# Patient Record
Sex: Female | Born: 1940 | Race: Black or African American | Hispanic: No | Marital: Married | Smoking: Never smoker
Health system: Southern US, Community
[De-identification: ages and names within clinical notes are randomized; demographics above are authoritative.]

## PROBLEM LIST (undated history)

## (undated) DIAGNOSIS — E1169 Type 2 diabetes mellitus with other specified complication: Secondary | ICD-10-CM

## (undated) DIAGNOSIS — G4730 Sleep apnea, unspecified: Secondary | ICD-10-CM

## (undated) DIAGNOSIS — N183 Chronic kidney disease, stage 3 unspecified: Secondary | ICD-10-CM

## (undated) DIAGNOSIS — K760 Fatty (change of) liver, not elsewhere classified: Secondary | ICD-10-CM

## (undated) DIAGNOSIS — F419 Anxiety disorder, unspecified: Secondary | ICD-10-CM

## (undated) DIAGNOSIS — Z87442 Personal history of urinary calculi: Secondary | ICD-10-CM

## (undated) DIAGNOSIS — F418 Other specified anxiety disorders: Secondary | ICD-10-CM

## (undated) DIAGNOSIS — I639 Cerebral infarction, unspecified: Secondary | ICD-10-CM

## (undated) DIAGNOSIS — I119 Hypertensive heart disease without heart failure: Secondary | ICD-10-CM

## (undated) DIAGNOSIS — J449 Chronic obstructive pulmonary disease, unspecified: Secondary | ICD-10-CM

## (undated) DIAGNOSIS — E1129 Type 2 diabetes mellitus with other diabetic kidney complication: Secondary | ICD-10-CM

## (undated) DIAGNOSIS — D860 Sarcoidosis of lung: Secondary | ICD-10-CM

## (undated) DIAGNOSIS — J9600 Acute respiratory failure, unspecified whether with hypoxia or hypercapnia: Secondary | ICD-10-CM

## (undated) DIAGNOSIS — M810 Age-related osteoporosis without current pathological fracture: Secondary | ICD-10-CM

## (undated) DIAGNOSIS — T50905A Adverse effect of unspecified drugs, medicaments and biological substances, initial encounter: Secondary | ICD-10-CM

## (undated) DIAGNOSIS — E785 Hyperlipidemia, unspecified: Secondary | ICD-10-CM

## (undated) DIAGNOSIS — J90 Pleural effusion, not elsewhere classified: Secondary | ICD-10-CM

## (undated) DIAGNOSIS — I779 Disorder of arteries and arterioles, unspecified: Secondary | ICD-10-CM

## (undated) DIAGNOSIS — I255 Ischemic cardiomyopathy: Secondary | ICD-10-CM

## (undated) DIAGNOSIS — I5032 Chronic diastolic (congestive) heart failure: Secondary | ICD-10-CM

## (undated) DIAGNOSIS — I739 Peripheral vascular disease, unspecified: Secondary | ICD-10-CM

## (undated) DIAGNOSIS — I70269 Atherosclerosis of native arteries of extremities with gangrene, unspecified extremity: Secondary | ICD-10-CM

## (undated) DIAGNOSIS — E1351 Other specified diabetes mellitus with diabetic peripheral angiopathy without gangrene: Secondary | ICD-10-CM

## (undated) DIAGNOSIS — M1990 Unspecified osteoarthritis, unspecified site: Secondary | ICD-10-CM

## (undated) DIAGNOSIS — K208 Other esophagitis: Secondary | ICD-10-CM

## (undated) DIAGNOSIS — I2510 Atherosclerotic heart disease of native coronary artery without angina pectoris: Secondary | ICD-10-CM

## (undated) DIAGNOSIS — R197 Diarrhea, unspecified: Secondary | ICD-10-CM

## (undated) DIAGNOSIS — E1165 Type 2 diabetes mellitus with hyperglycemia: Secondary | ICD-10-CM

## (undated) HISTORY — PX: CARPAL TUNNEL RELEASE: SHX101

## (undated) HISTORY — DX: Chronic kidney disease, stage 3 (moderate): N18.3

## (undated) HISTORY — DX: Atherosclerosis of native arteries of extremities with gangrene, unspecified extremity: I70.269

## (undated) HISTORY — PX: HERNIA REPAIR: SHX51

## (undated) HISTORY — DX: Cerebral infarction, unspecified: I63.9

## (undated) HISTORY — DX: Atherosclerotic heart disease of native coronary artery without angina pectoris: I25.10

## (undated) HISTORY — PX: EYE SURGERY: SHX253

## (undated) HISTORY — DX: Type 2 diabetes mellitus with other diabetic kidney complication: E11.29

## (undated) HISTORY — DX: Type 2 diabetes mellitus with other specified complication: E11.69

## (undated) HISTORY — DX: Unspecified osteoarthritis, unspecified site: M19.90

## (undated) HISTORY — DX: Ischemic cardiomyopathy: I25.5

## (undated) HISTORY — DX: Fatty (change of) liver, not elsewhere classified: K76.0

## (undated) HISTORY — DX: Diarrhea, unspecified: R19.7

## (undated) HISTORY — DX: Other specified anxiety disorders: F41.8

## (undated) HISTORY — DX: Type 2 diabetes mellitus with hyperglycemia: E11.65

## (undated) HISTORY — DX: Other esophagitis: K20.8

## (undated) HISTORY — DX: Acute respiratory failure, unspecified whether with hypoxia or hypercapnia: J96.00

## (undated) HISTORY — DX: Other specified diabetes mellitus with diabetic peripheral angiopathy without gangrene: E13.51

## (undated) HISTORY — DX: Age-related osteoporosis without current pathological fracture: M81.0

## (undated) HISTORY — PX: CORONARY ANGIOPLASTY WITH STENT PLACEMENT: SHX49

## (undated) HISTORY — DX: Sarcoidosis of lung: D86.0

## (undated) HISTORY — DX: Chronic obstructive pulmonary disease, unspecified: J44.9

## (undated) HISTORY — DX: Adverse effect of unspecified drugs, medicaments and biological substances, initial encounter: T50.905A

## (undated) HISTORY — PX: OOPHORECTOMY: SHX86

## (undated) HISTORY — PX: CHOLECYSTECTOMY: SHX55

## (undated) HISTORY — DX: Hypertensive heart disease without heart failure: I11.9

## (undated) HISTORY — PX: BREAST CYST ASPIRATION: SHX578

## (undated) HISTORY — DX: Hyperlipidemia, unspecified: E78.5

## (undated) HISTORY — DX: Pleural effusion, not elsewhere classified: J90

## (undated) HISTORY — DX: Chronic kidney disease, stage 3 unspecified: N18.30

## (undated) HISTORY — PX: ABDOMINAL HYSTERECTOMY: SHX81

---

## 2006-03-19 ENCOUNTER — Ambulatory Visit: Payer: Self-pay | Admitting: Internal Medicine

## 2006-12-12 LAB — HM PAP SMEAR

## 2007-01-05 ENCOUNTER — Ambulatory Visit: Payer: Self-pay | Admitting: Gastroenterology

## 2007-03-03 ENCOUNTER — Ambulatory Visit: Payer: Self-pay | Admitting: Internal Medicine

## 2007-05-21 ENCOUNTER — Emergency Department: Payer: Self-pay | Admitting: Emergency Medicine

## 2007-05-21 ENCOUNTER — Other Ambulatory Visit: Payer: Self-pay

## 2007-06-08 ENCOUNTER — Emergency Department: Payer: Self-pay | Admitting: Unknown Physician Specialty

## 2007-06-08 ENCOUNTER — Other Ambulatory Visit: Payer: Self-pay

## 2007-07-08 ENCOUNTER — Emergency Department: Payer: Self-pay | Admitting: Internal Medicine

## 2007-07-16 ENCOUNTER — Emergency Department: Payer: Self-pay | Admitting: Emergency Medicine

## 2007-07-27 ENCOUNTER — Ambulatory Visit: Payer: Self-pay

## 2007-10-20 ENCOUNTER — Ambulatory Visit: Payer: Self-pay | Admitting: Surgery

## 2007-11-30 ENCOUNTER — Inpatient Hospital Stay: Payer: Self-pay | Admitting: Surgery

## 2008-03-15 ENCOUNTER — Ambulatory Visit: Payer: Self-pay | Admitting: Internal Medicine

## 2008-07-11 IMAGING — CR DG CHEST 2V
1 series · 2 of 2 positions shown · non-contrast
Comparison: none

REASON FOR EXAM: Shortness of breath
COMMENTS:

PROCEDURE:     DXR - DXR CHEST PA (OR AP) AND LATERAL  - March 19, 2006 [DATE]
RESULT:     The lung fields are clear. The heart, mediastinal and osseous
structures show no acute changes. There is an old fracture of the fifth,
LEFT posterior rib. No acute bony abnormalities are seen.

[Series 1: view not recorded · 0.17mm/px · 2 of 2 slices shown]
[im 1/2]
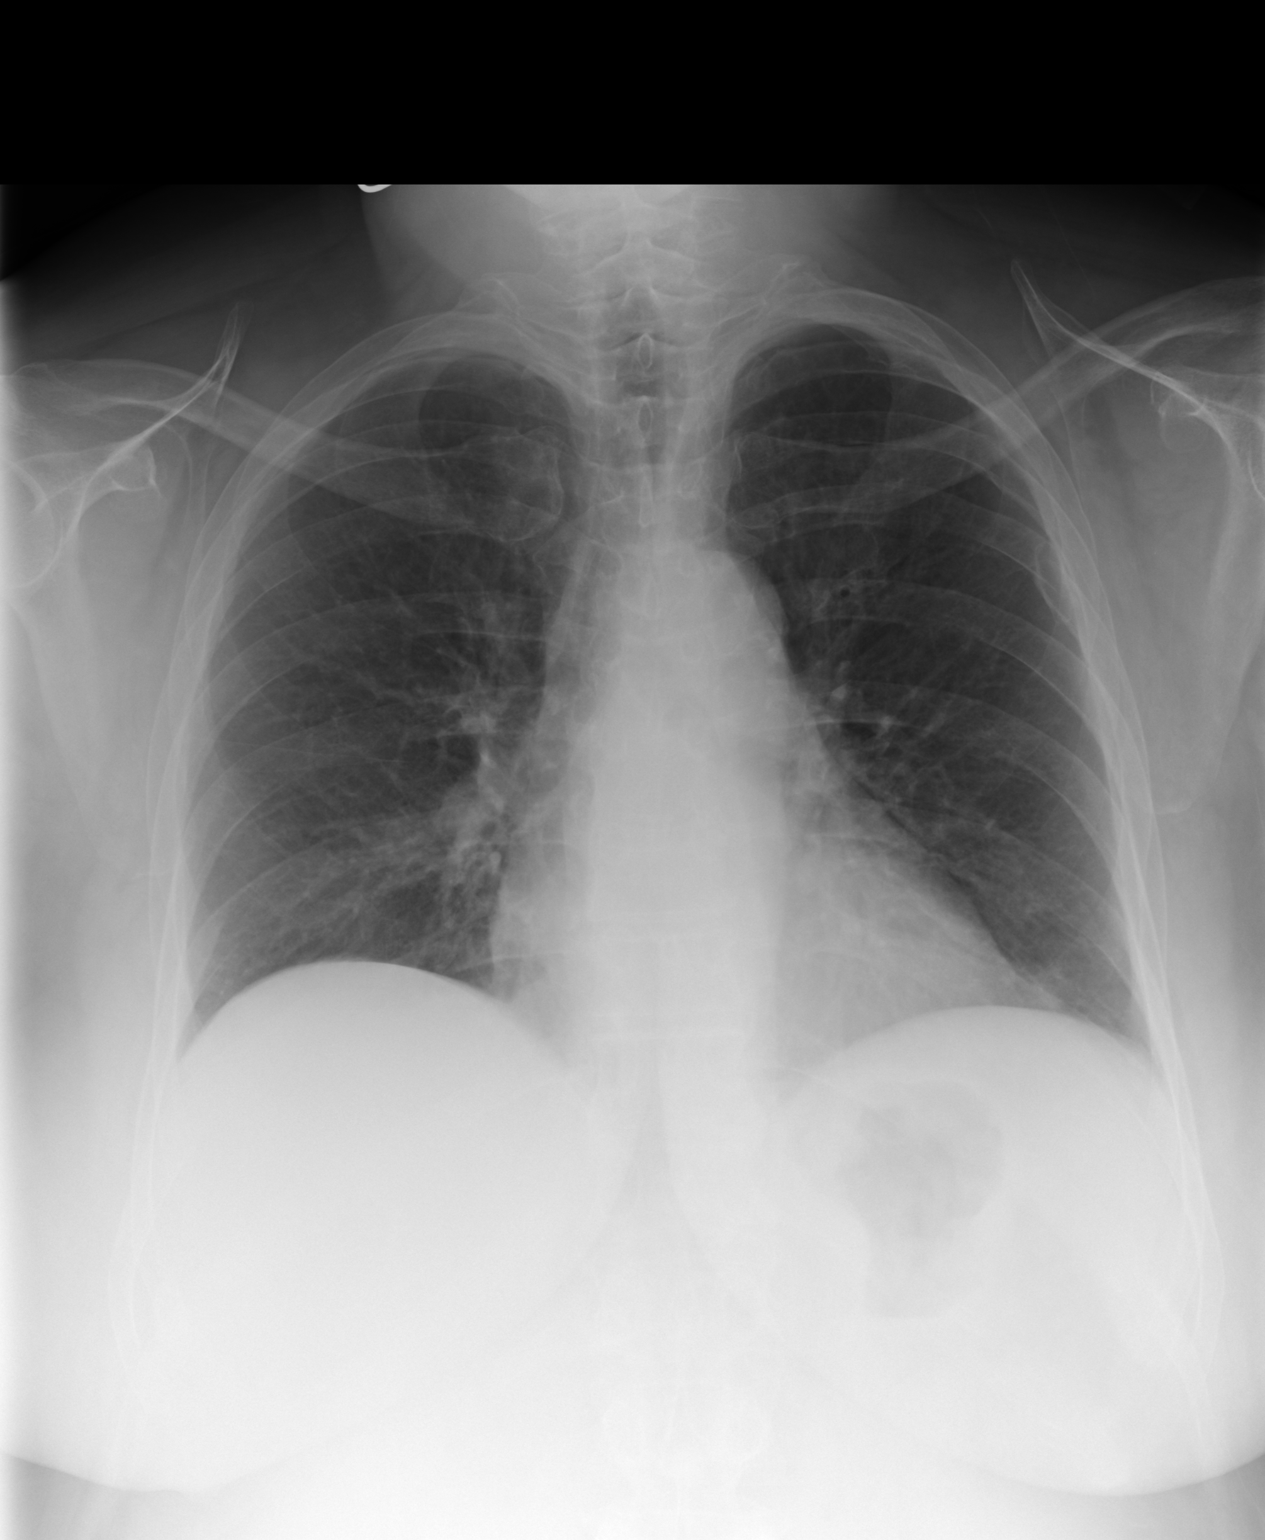
[im 2/2]
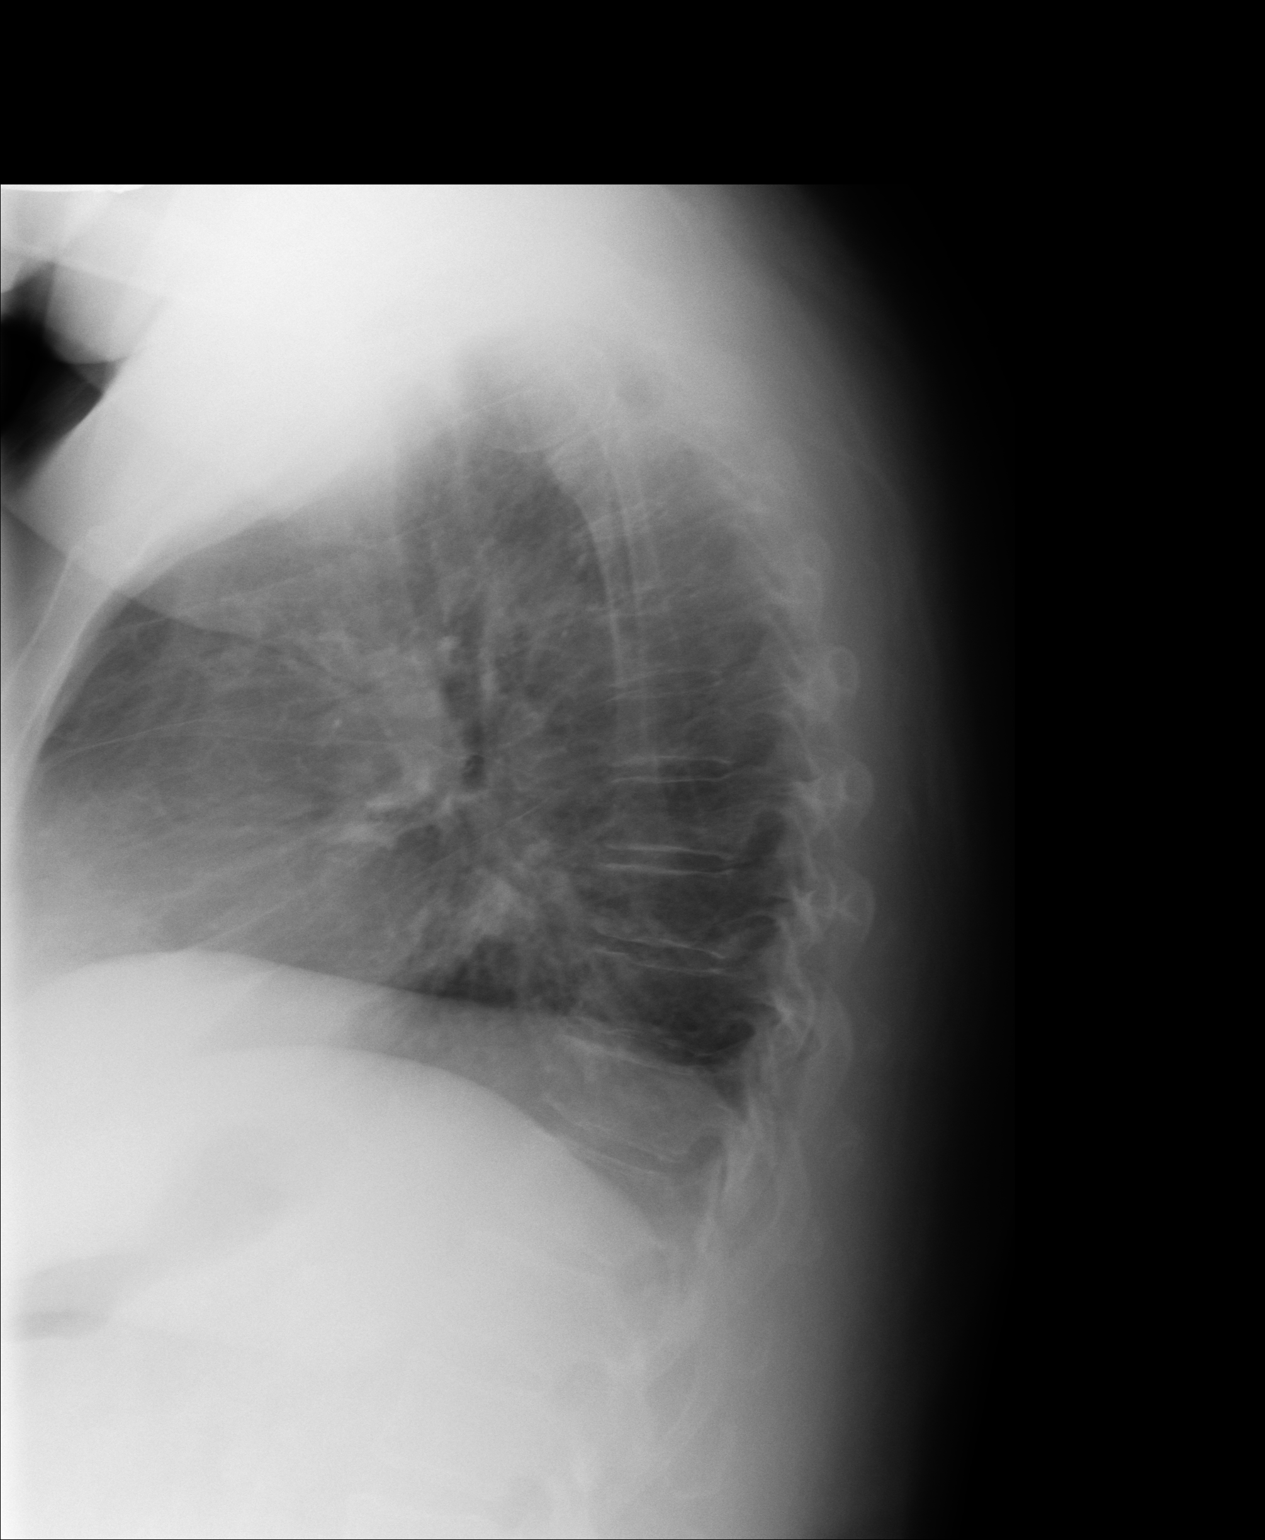

[2 of 2 positions shown; findings below may reference images not displayed]

IMPRESSION: No acute changes are identified.

## 2008-09-10 ENCOUNTER — Emergency Department: Payer: Self-pay | Admitting: Emergency Medicine

## 2008-09-11 ENCOUNTER — Emergency Department: Payer: Self-pay | Admitting: Emergency Medicine

## 2009-02-13 ENCOUNTER — Ambulatory Visit: Payer: Self-pay | Admitting: Cardiovascular Disease

## 2009-07-19 ENCOUNTER — Ambulatory Visit: Payer: Self-pay | Admitting: Internal Medicine

## 2009-09-12 IMAGING — US US EXTREM LOW VENOUS*L*
1 series · 18 of 24 positions shown · non-contrast
Comparison: none

REASON FOR EXAM: LLE  pain
COMMENTS:

PROCEDURE:     US  - US DOPPLER LOW EXTR LEFT  - May 21, 2007  [DATE]
RESULT:     LEFT lower extremity color flow Duplex Doppler reveals no
evidence of deep venous thrombosis. Compression studies, Doppler studies and
color flow analysis are normal.

[Series 1: us extrem low venous*left* · 18 of 29 slices shown]
[im 1/29]
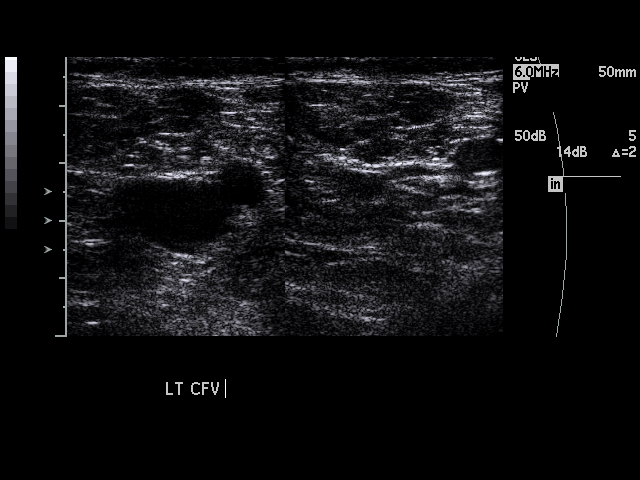
[im 3/29]
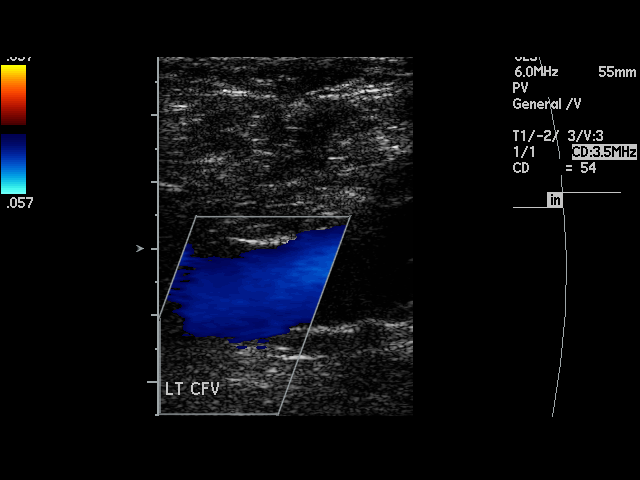
[im 4/29]
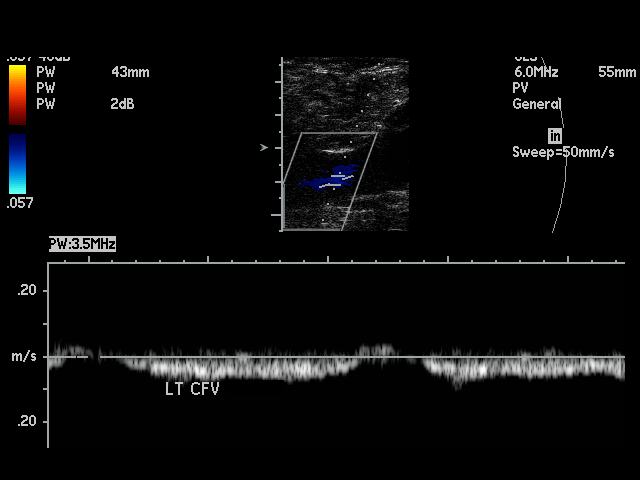
[im 5/29]
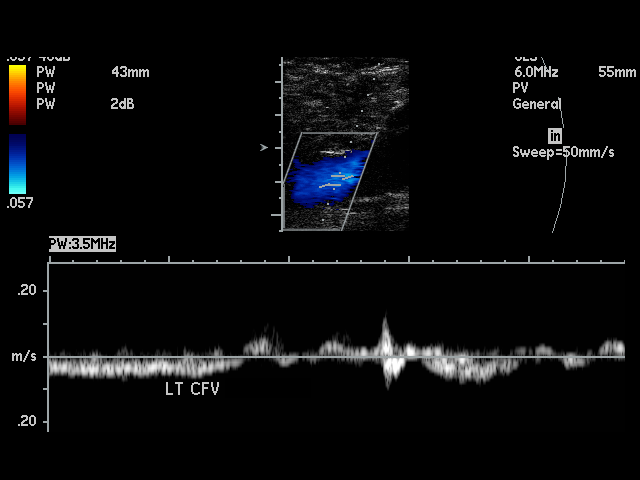
[im 8/29]
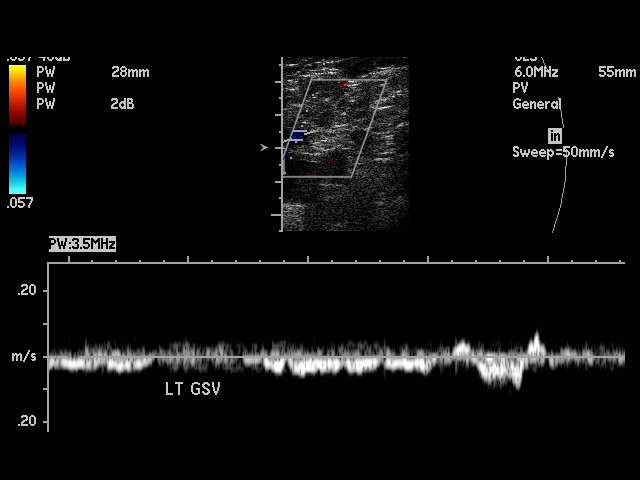
[im 9/29]
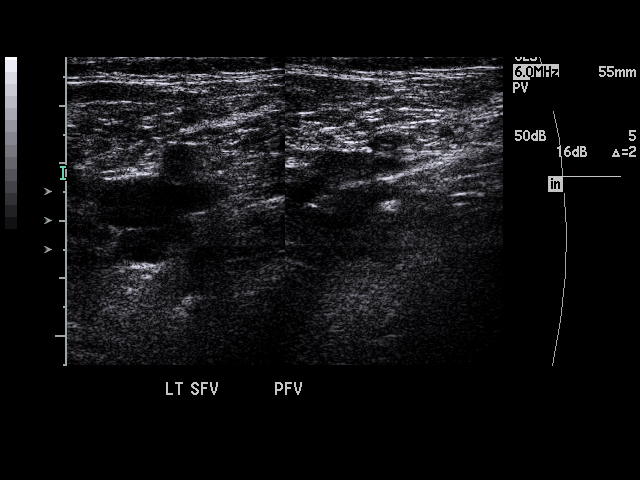
[im 10/29]
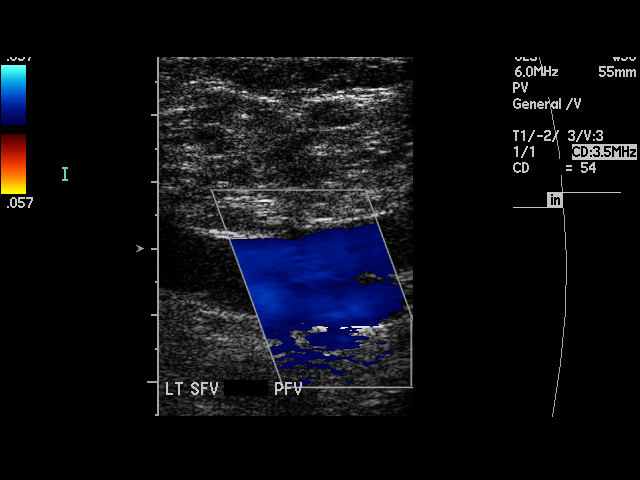
[im 13/29]
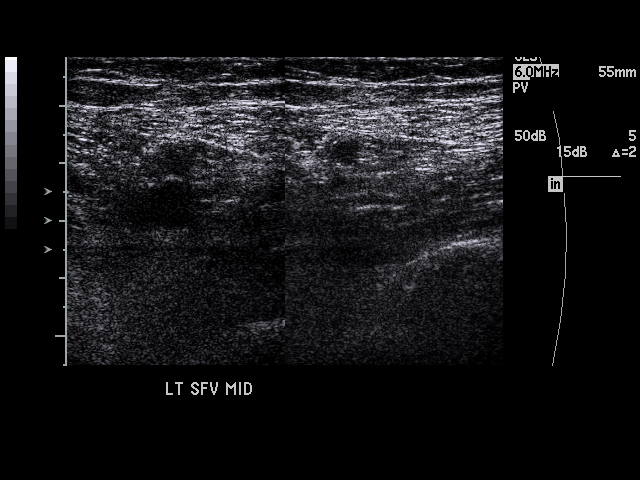
[im 14/29]
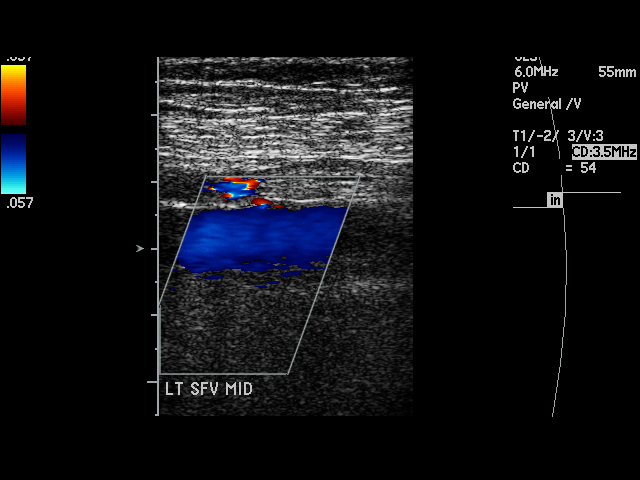
[im 15/29]
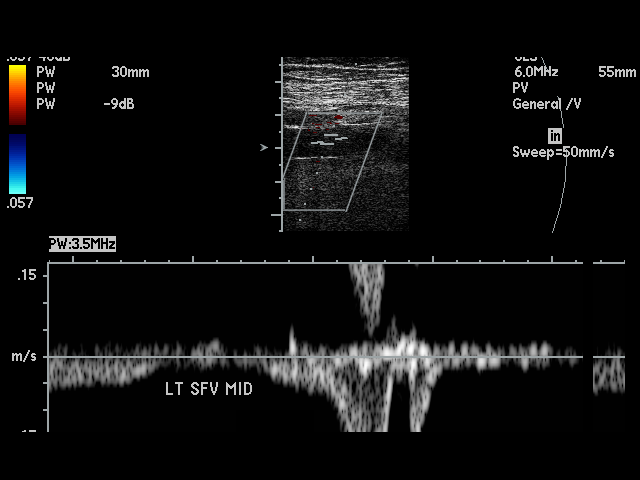
[im 18/29]
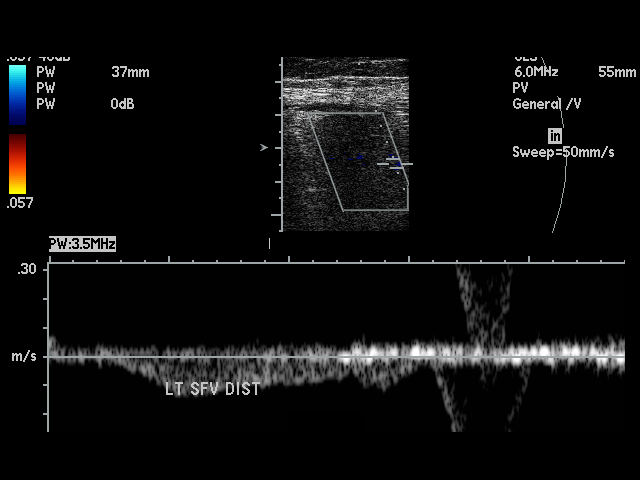
[im 19/29]
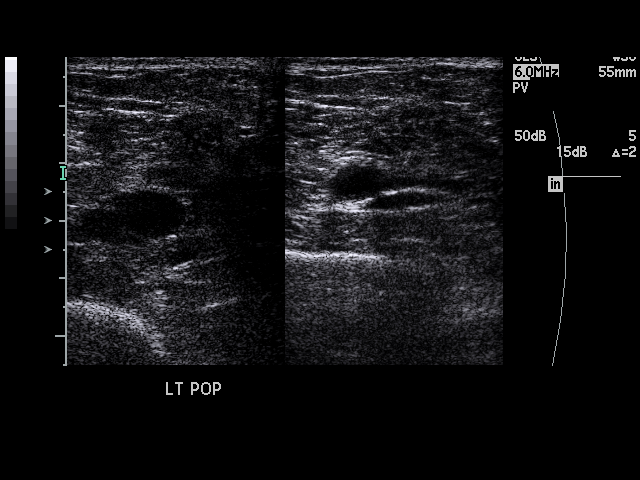
[im 20/29]
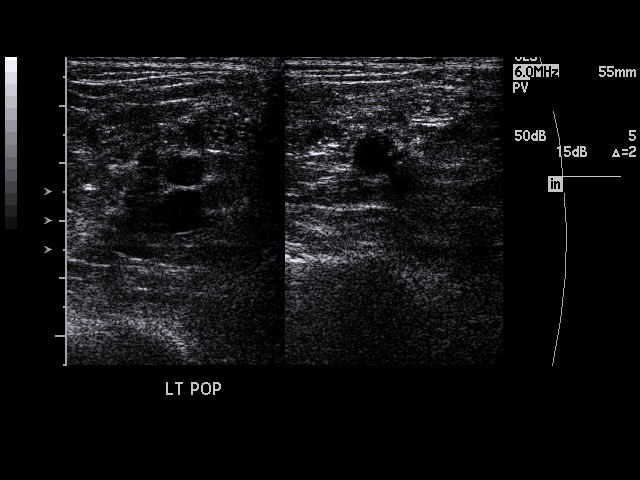
[im 22/29]
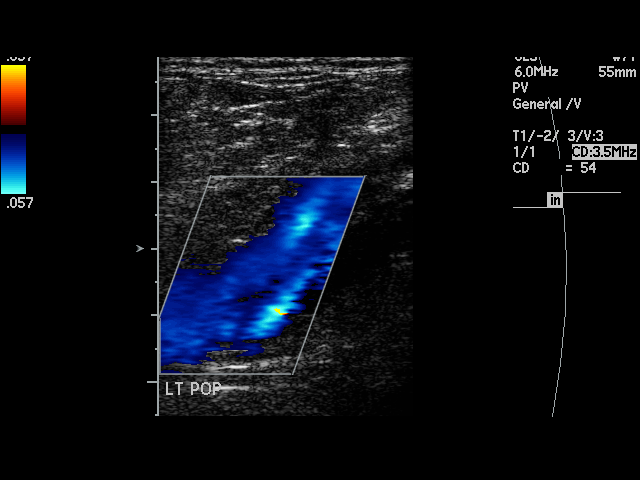
[im 24/29]
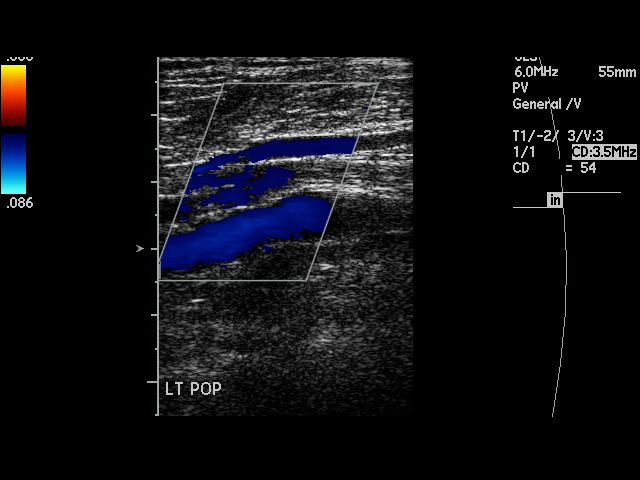
[im 25/29]
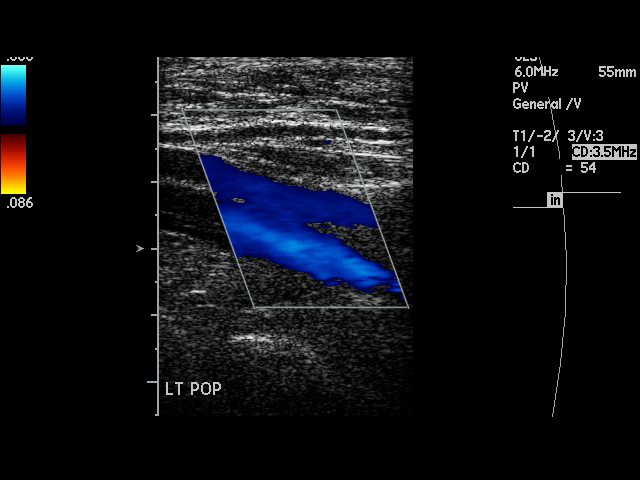
[im 27/29]
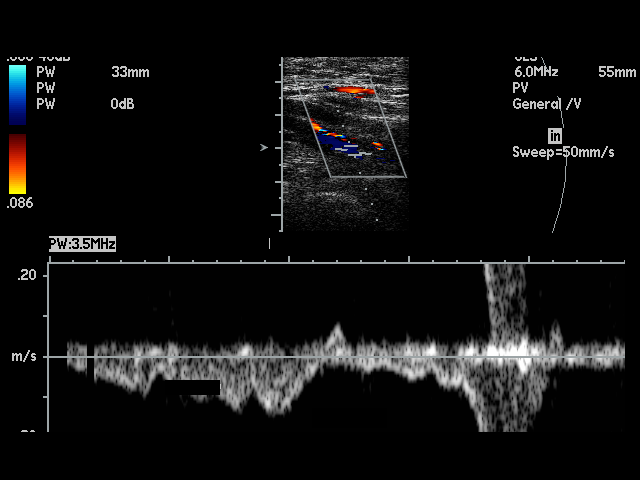
[im 29/29]
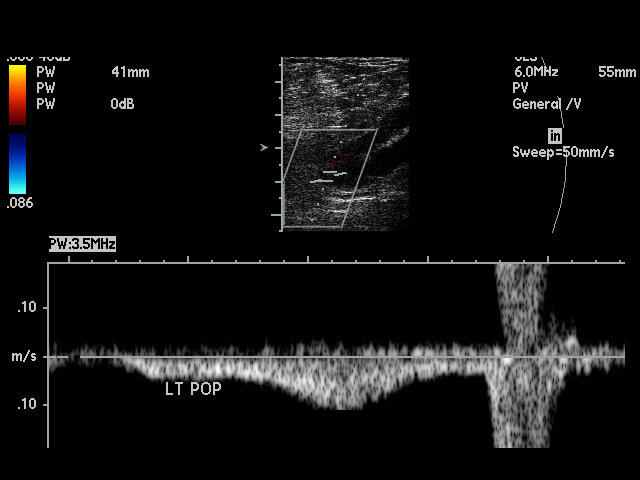

[18 of 24 positions shown; findings below may reference images not displayed]

IMPRESSION: Negative exam.

## 2009-09-12 IMAGING — CR DG CHEST 1V PORT
1 series · 1 of 1 positions shown · non-contrast
Comparison: none

REASON FOR EXAM: chest pain
COMMENTS:

PROCEDURE:     DXR - DXR PORTABLE CHEST SINGLE VIEW  - May 21, 2007  [DATE]
RESULT:     Basilar atelectasis is present.  The patient has taken a poor
inspiratory effort.  Follow up chest x-rays can be obtained to exclude
developing infiltrate. The heart size is normal.

[view not recorded]
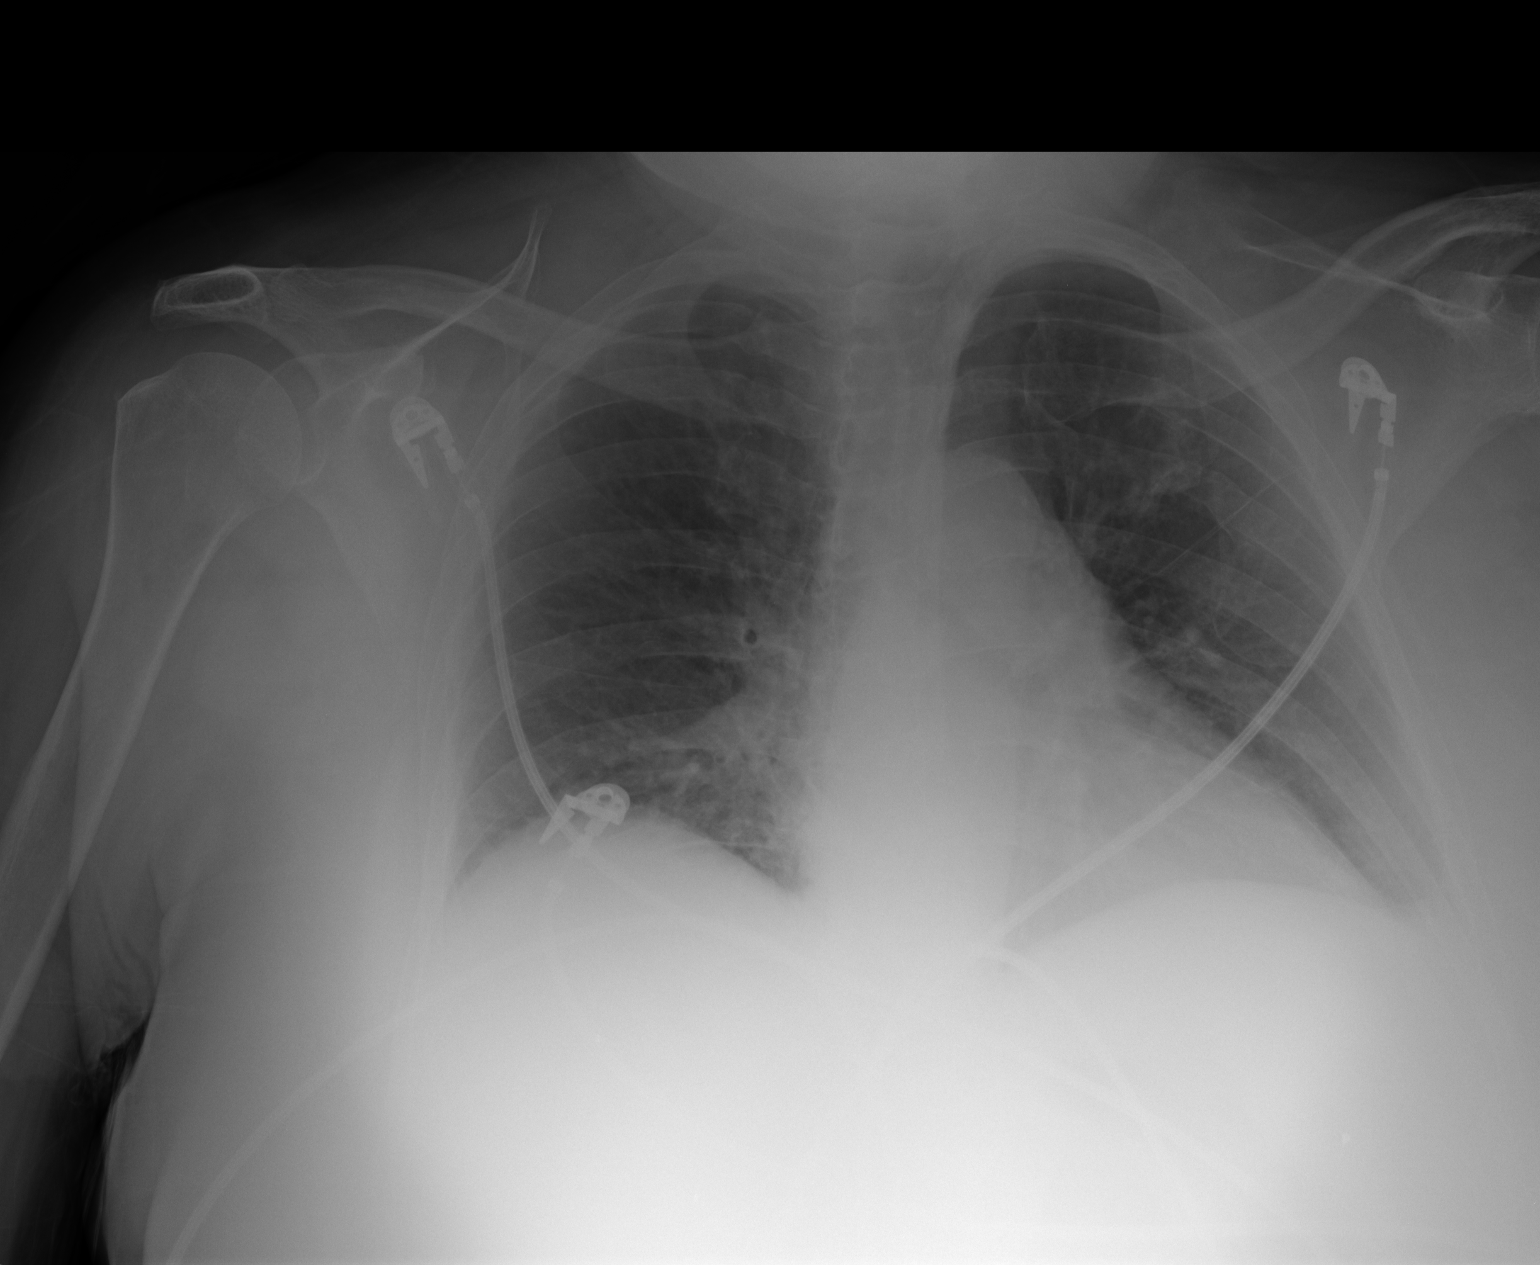

[1 of 1 positions shown; findings below may reference images not displayed]

IMPRESSION: Poor inspiratory effort with mild basilar atelectasis.

## 2009-09-30 IMAGING — CR CERVICAL SPINE - 2-3 VIEW
1 series · 6 of 6 positions shown · non-contrast
Comparison: none

REASON FOR EXAM: Pain
COMMENTS:

PROCEDURE:     DXR - DXR C- SPINE AP AND LATERAL  - June 08, 2007 [DATE]
RESULT:     Multilevel degenerative changes are appreciated within the
cervical spine without evidence of fracture, dislocation or malalignment.
There does not appear to be evidence of prevertebral soft tissue swelling.

[Series 1: view not recorded · 0.17mm/px · 6 of 6 slices shown]
[im 1/6]
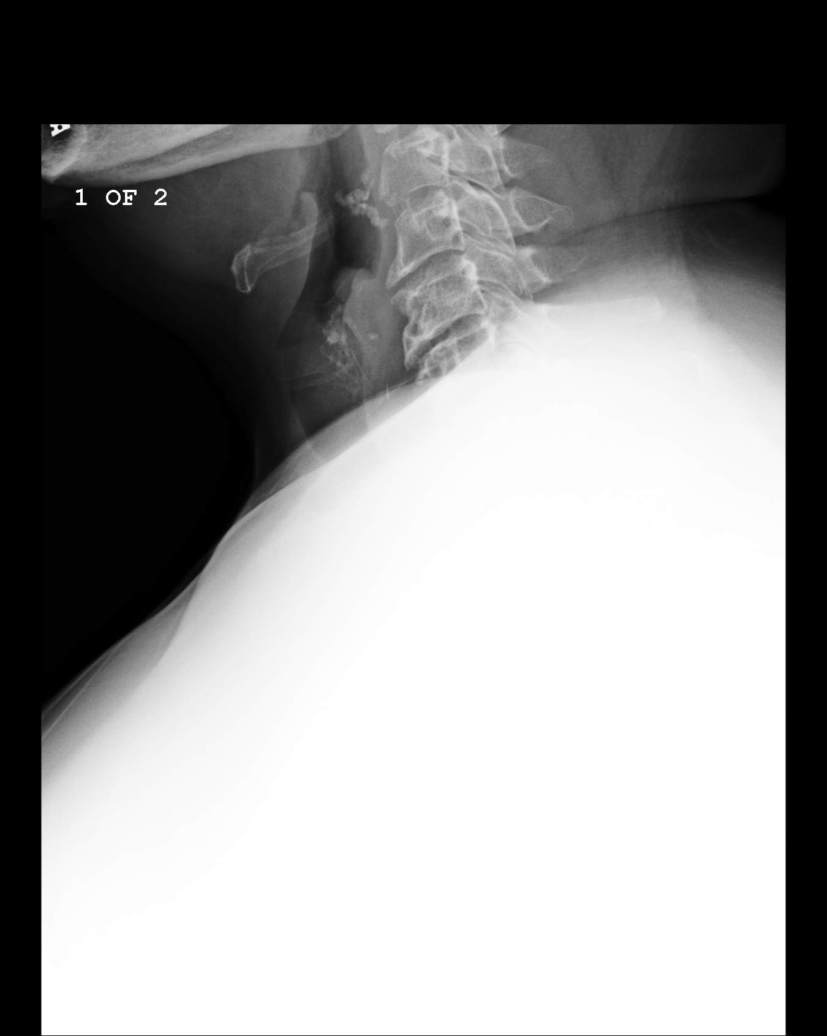
[im 2/6]
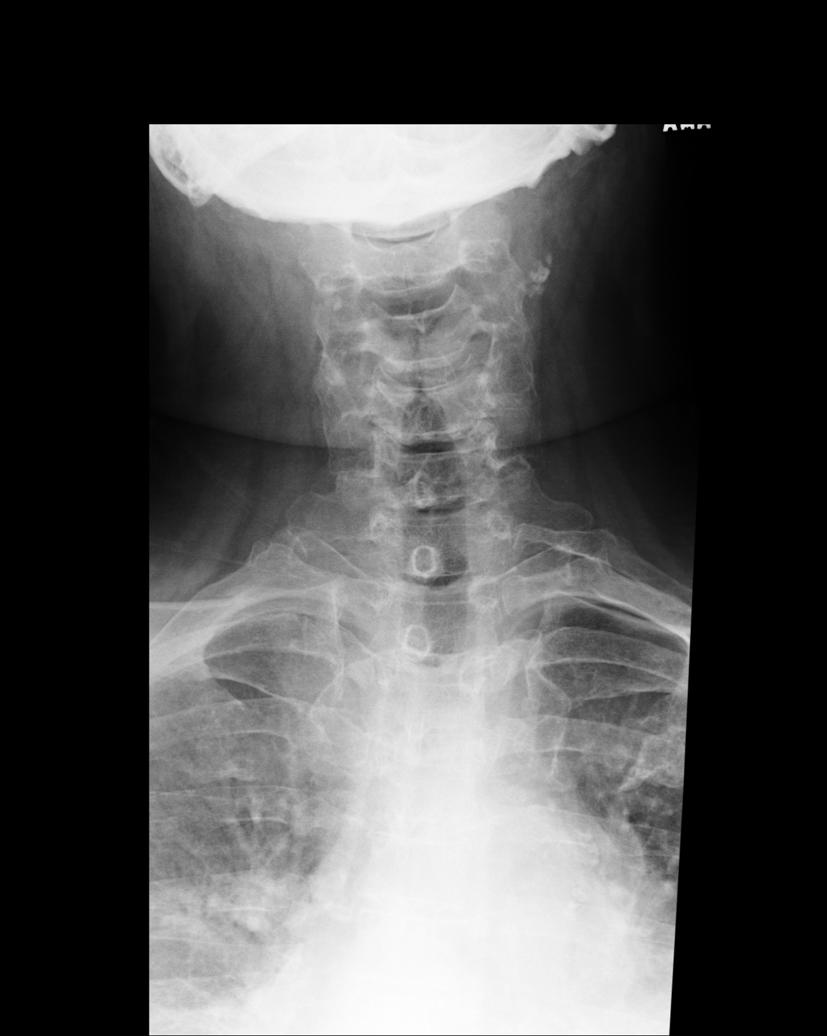
[im 3/6]
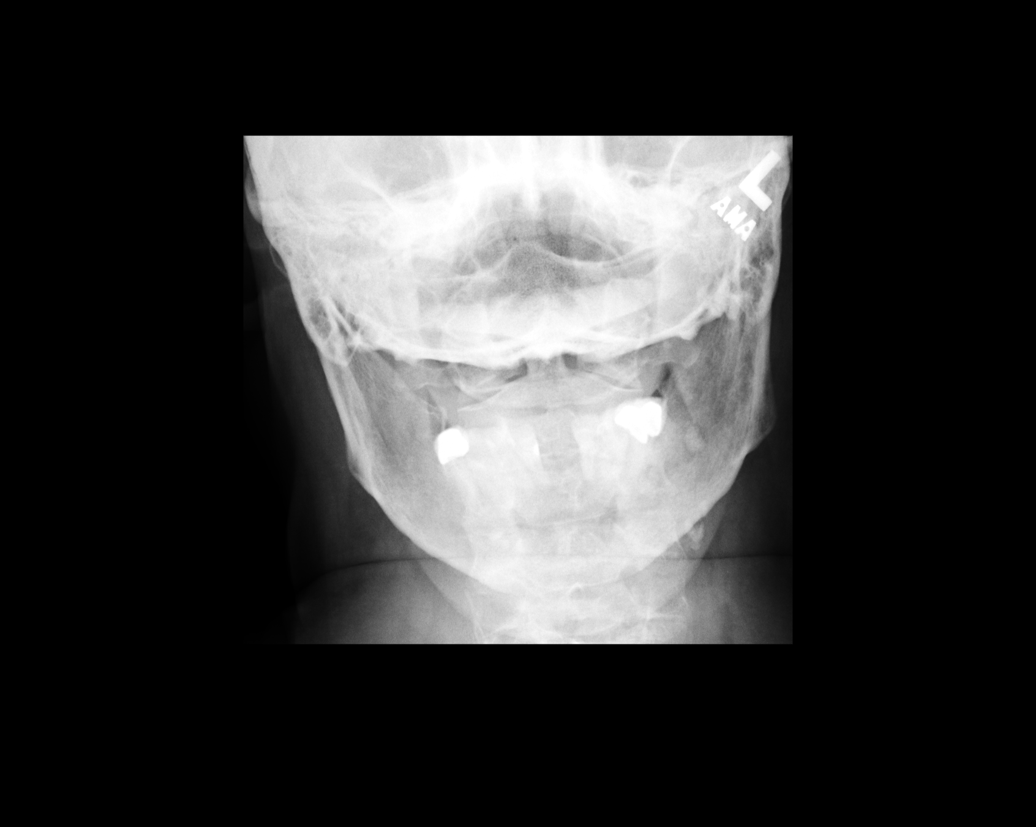
[im 4/6]
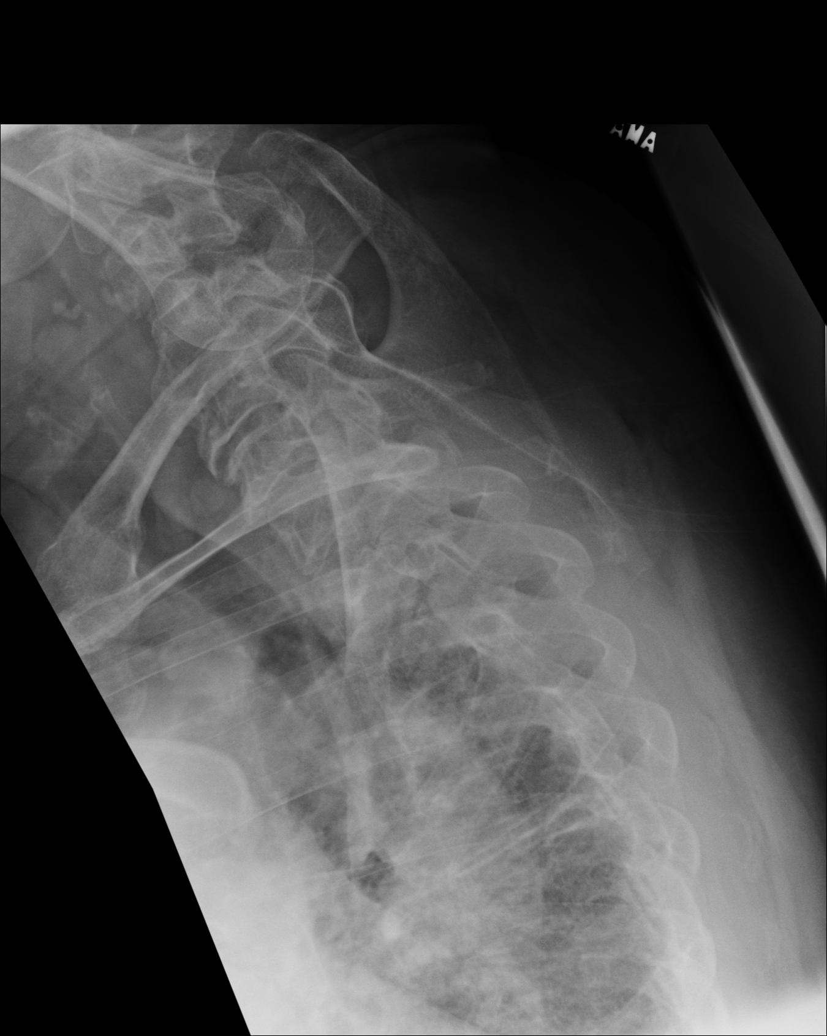
[im 5/6]
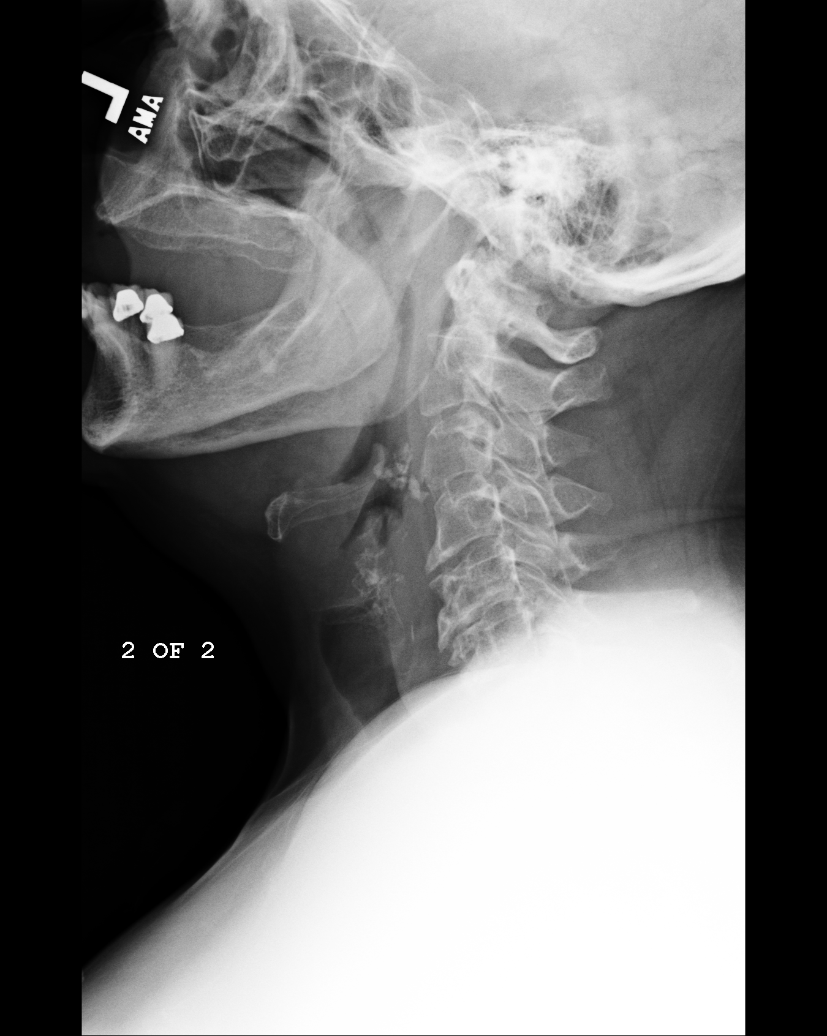
[im 6/6]
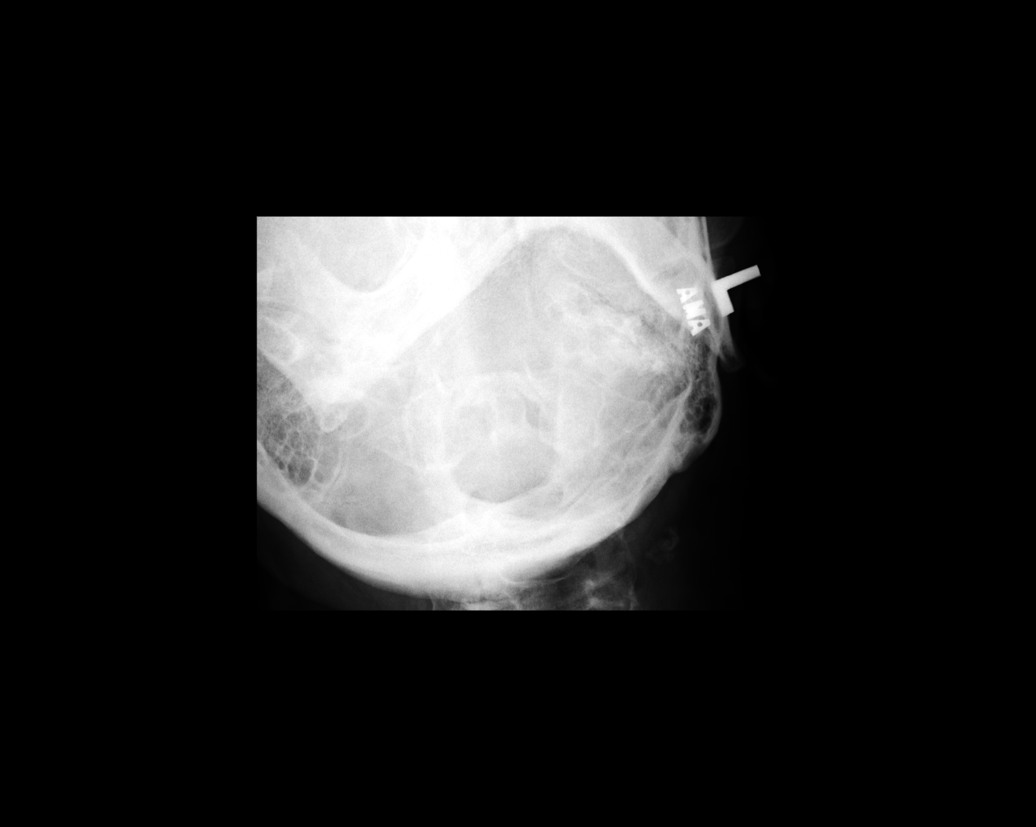

[6 of 6 positions shown; findings below may reference images not displayed]

IMPRESSION: Degenerative changes without evidence of acute osseous
abnormalities.

## 2009-09-30 IMAGING — CT CT HEAD WITHOUT CONTRAST
2 series · 16 of 30 positions shown, 20 images · non-contrast
Comparison: none

REASON FOR EXAM: Diffuse pain and weakness
COMMENTS:

[Series 2: without · axial · non-contrast · 0.39mm/px · z∈[+843,+963]mm · 13 of 28 slices shown, 17 images]
[im 2/28  brain]
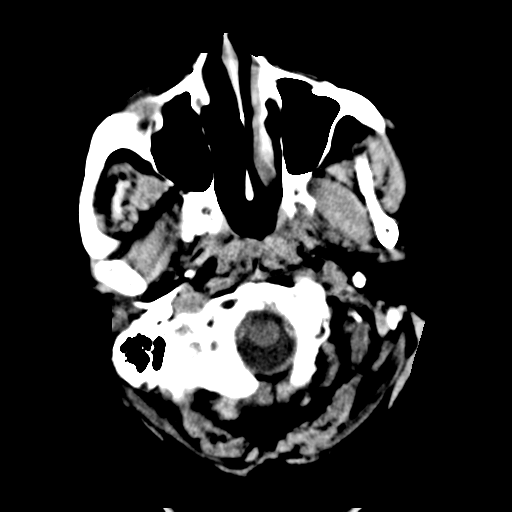
[im 2/28  bone]
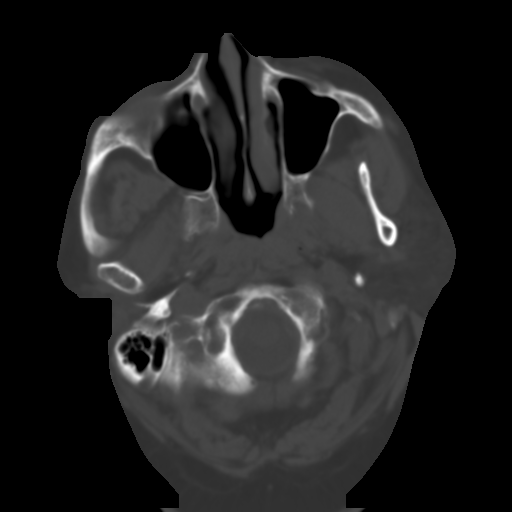
[im 4/28  brain]
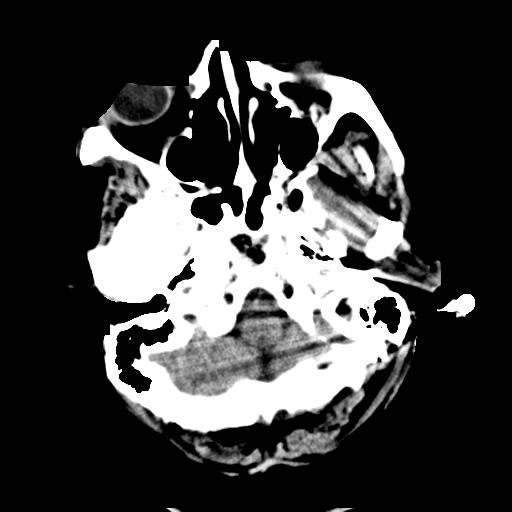
[im 6/28  brain]
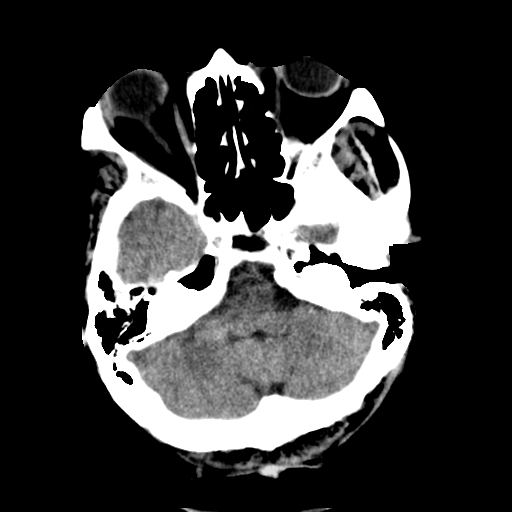
[im 8/28  brain]
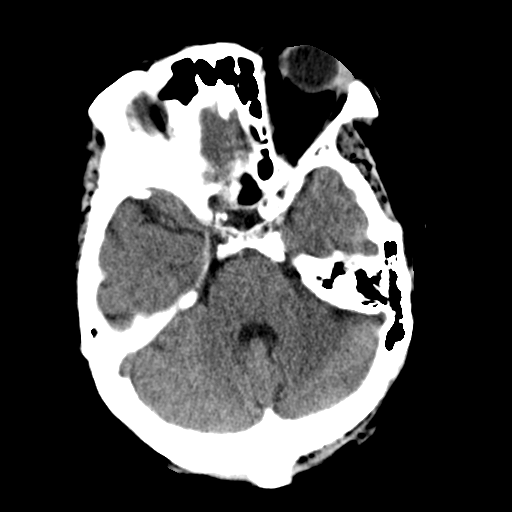
[im 10/28  brain]
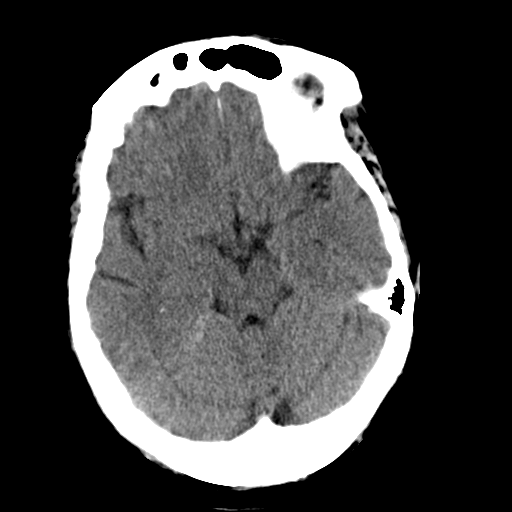
[im 10/28  bone]
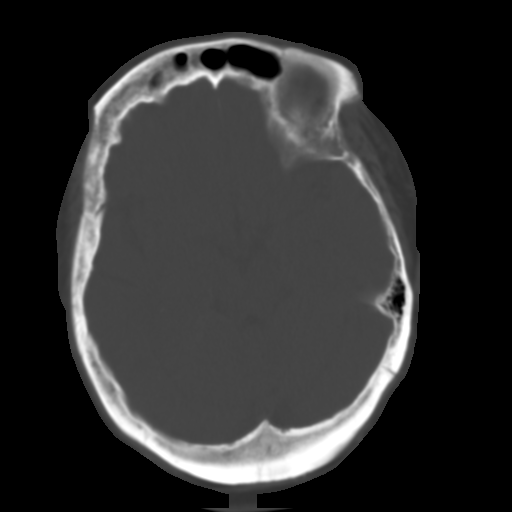
[im 12/28  brain]
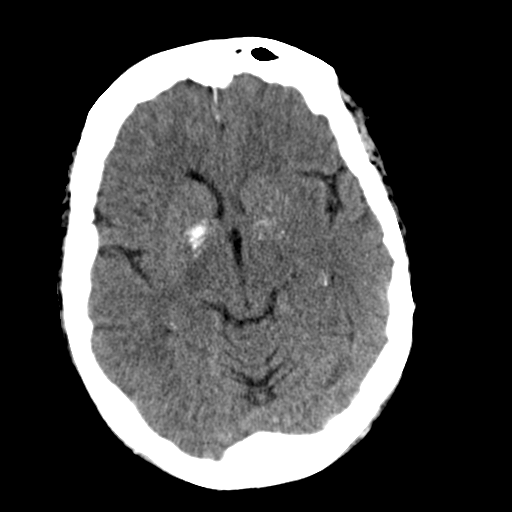
[im 14/28  brain]
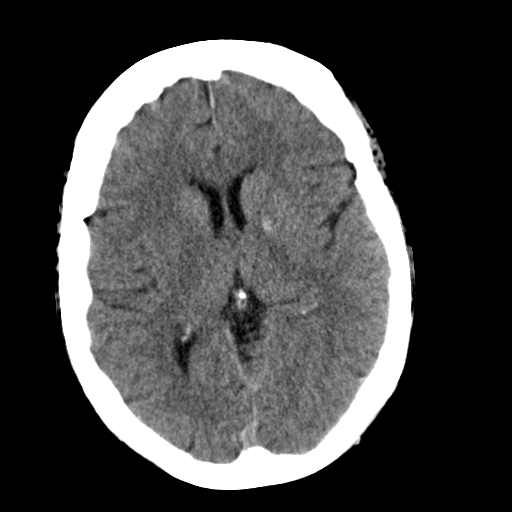
[im 16/28  brain]
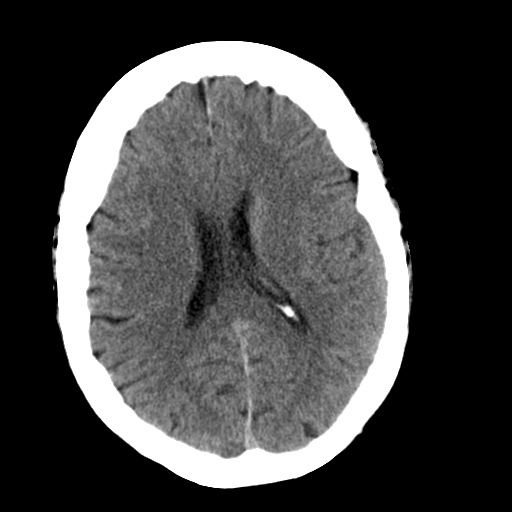
[im 18/28  brain]
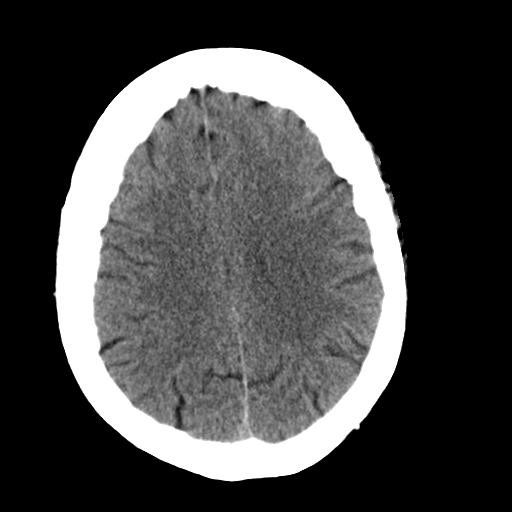
[im 18/28  bone]
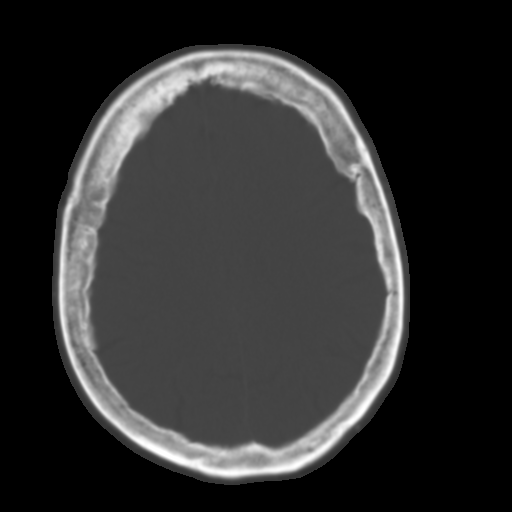
[im 20/28  brain]
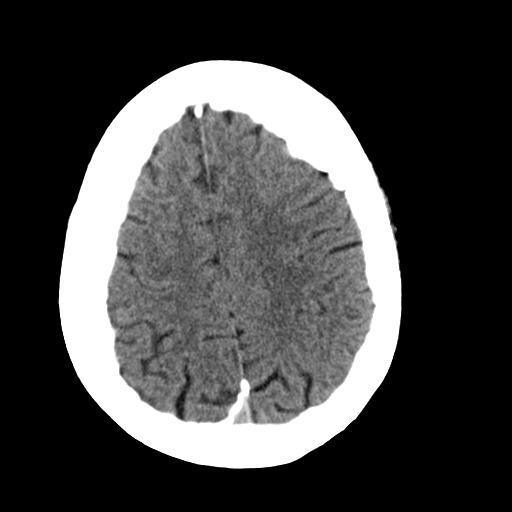
[im 22/28  brain]
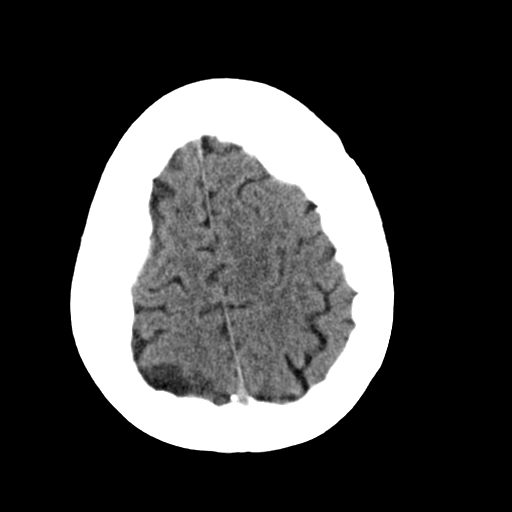
[im 24/28  brain]
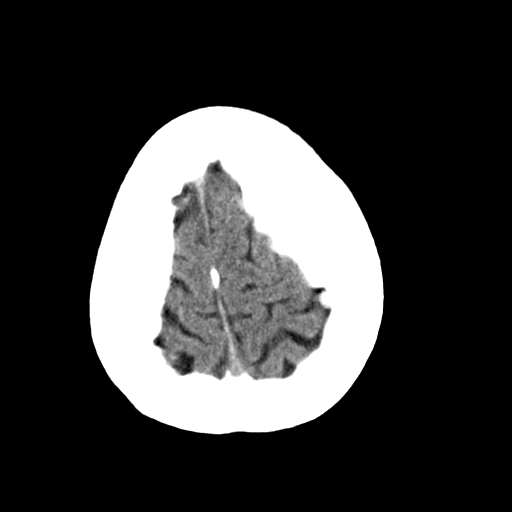
[im 26/28  brain]
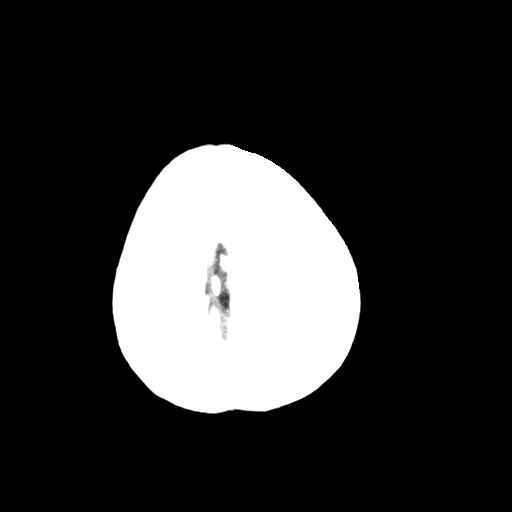
[im 26/28  bone]
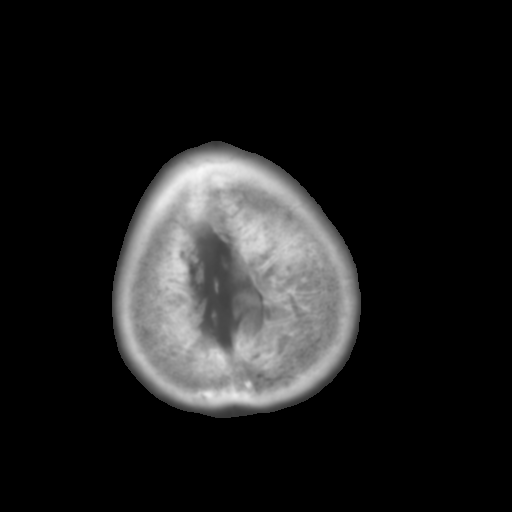

[Series 3: bone · axial · 0.39mm/px · z∈[+843,+883]mm · 3 of 28 slices shown]
[im 2/28  bone]
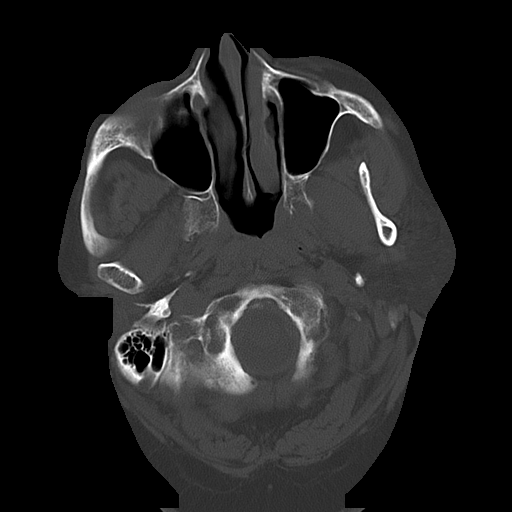
[im 6/28  bone]
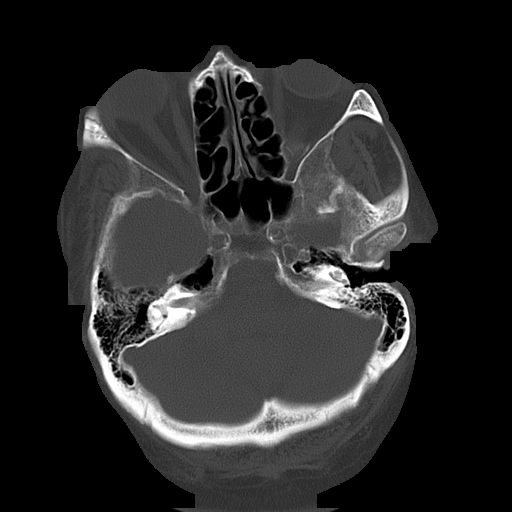
[im 10/28  bone]
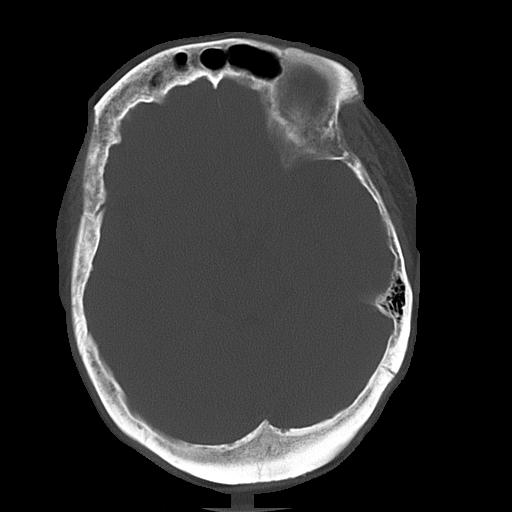

[16 of 30 positions shown; findings below may reference images not displayed]

PROCEDURE:     CT  - CT HEAD WITHOUT CONTRAST  - June 08, 2007 [DATE]

RESULT:     The ventricles are normal in size and position. There is a small
amount of basal ganglia calcification bilaterally which is a physiologic
variant. There is no intracranial hemorrhage, mass or mass effect. There is
hyperostosis frontalis interna. The cerebellum and brainstem exhibit no
acute abnormality. The observed portions of the paranasal sinuses are clear.
IMPRESSION: 1.  I do not see evidence of an acute ischemic or hemorrhagic event.
2.  There is no evidence of an intracranial mass or hydrocephalus.
3.  There is physiologic calcification of the basal ganglia.

This report was called to the [HOSPITAL] the conclusion of the
study.

## 2009-10-30 IMAGING — CT CT HEAD WITHOUT CONTRAST
2 series · 16 of 30 positions shown, 20 images · non-contrast
Comparison: none

REASON FOR EXAM: fell pain
COMMENTS:

[Series 2: without · axial · non-contrast · 0.39mm/px · z∈[+1241,+1361]mm · 13 of 30 slices shown, 17 images]
[im 3/30  brain]
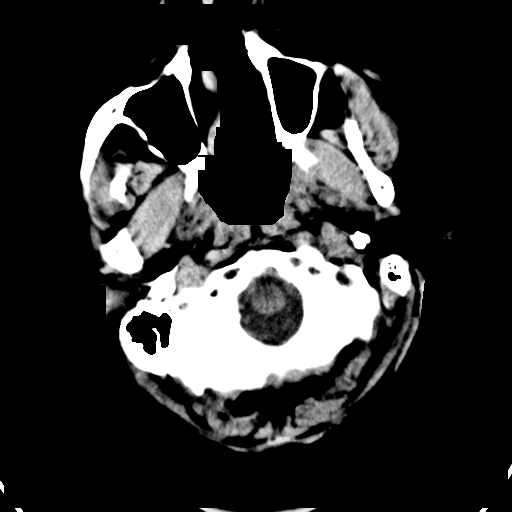
[im 3/30  bone]
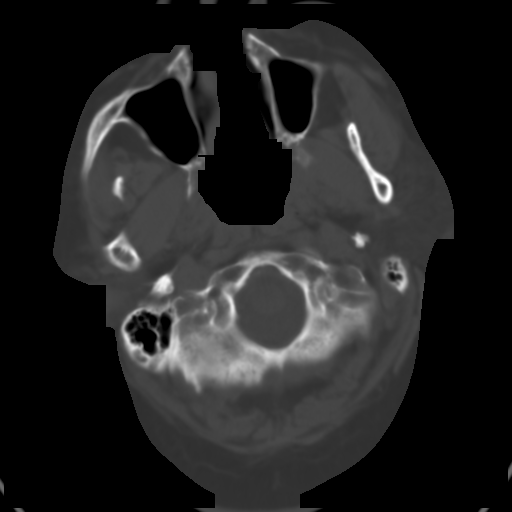
[im 5/30  brain]
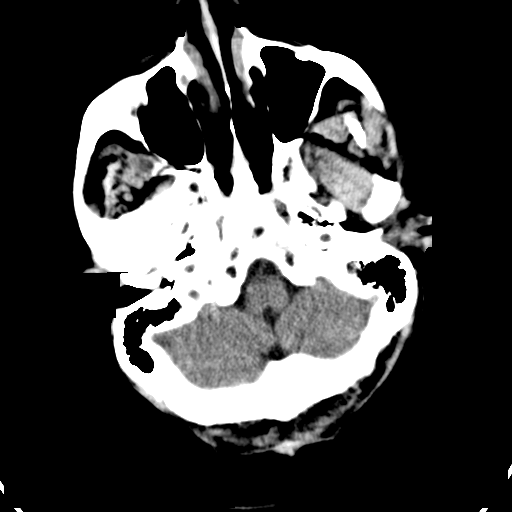
[im 7/30  brain]
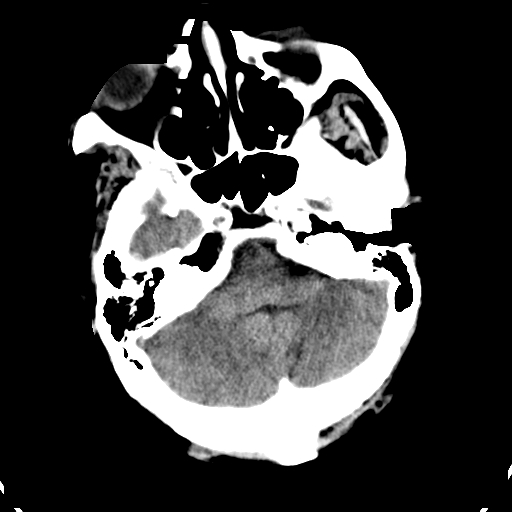
[im 9/30  brain]
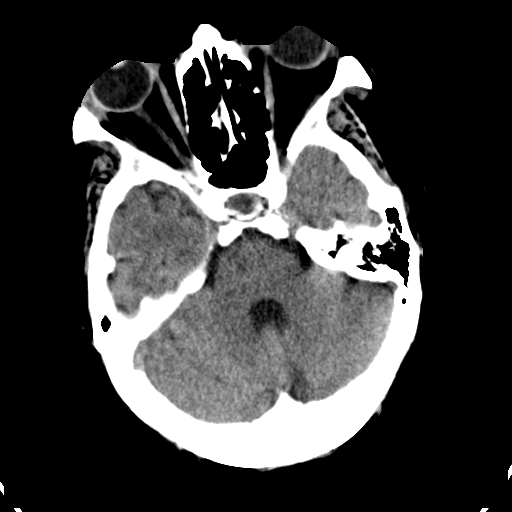
[im 11/30  brain]
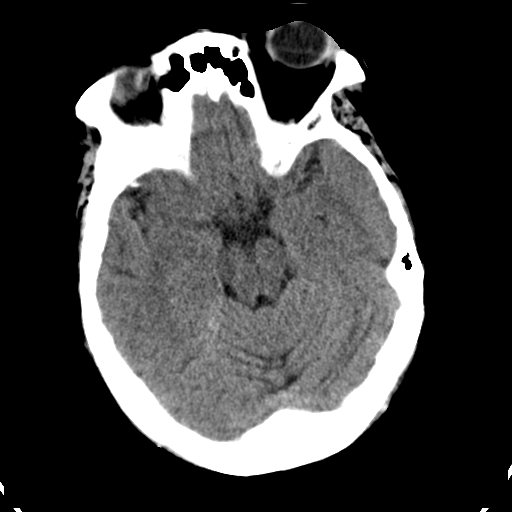
[im 11/30  bone]
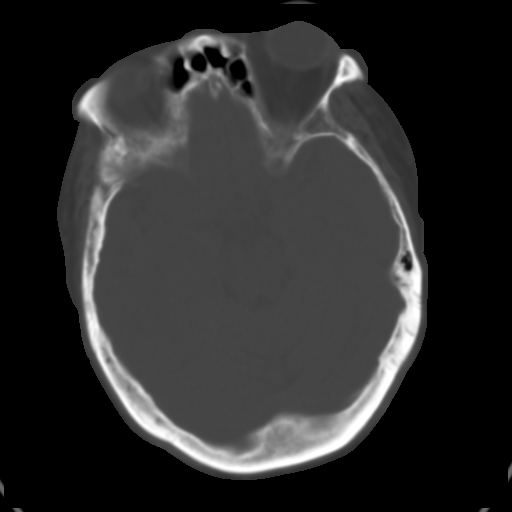
[im 13/30  brain]
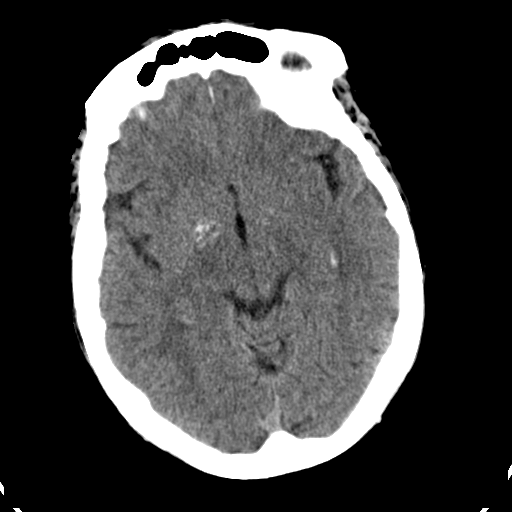
[im 15/30  brain]
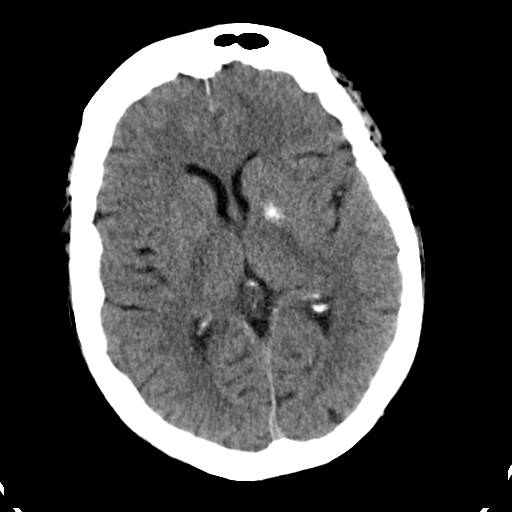
[im 17/30  brain]
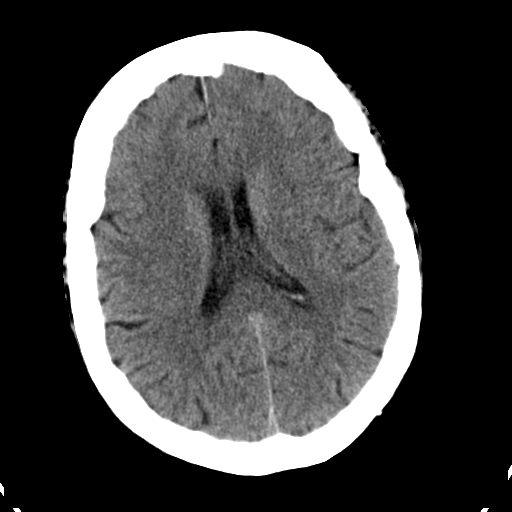
[im 19/30  brain]
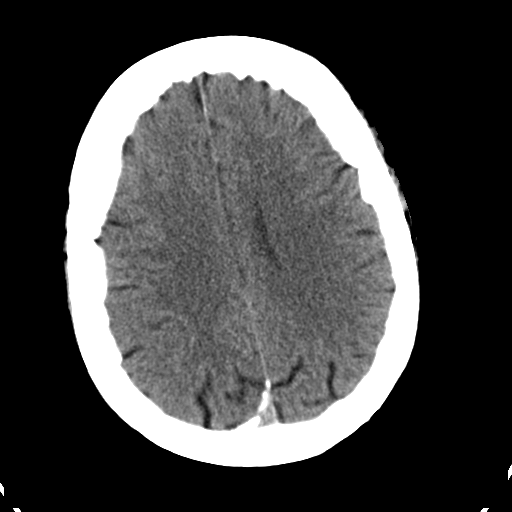
[im 19/30  bone]
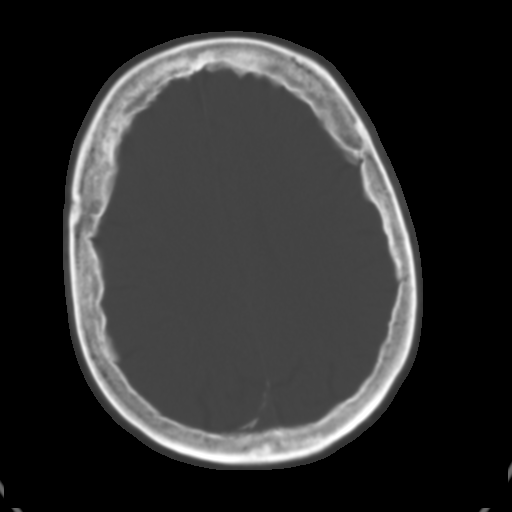
[im 21/30  brain]
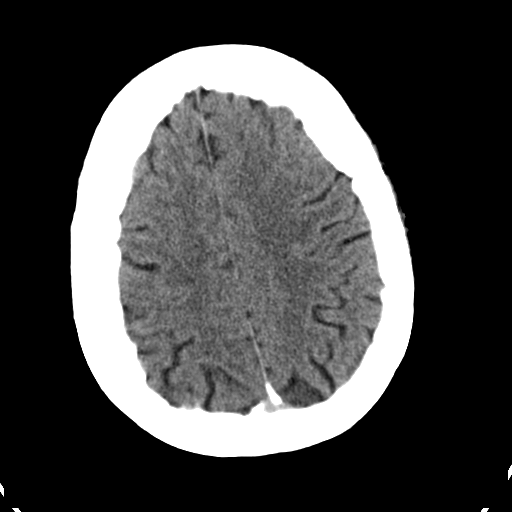
[im 23/30  brain]
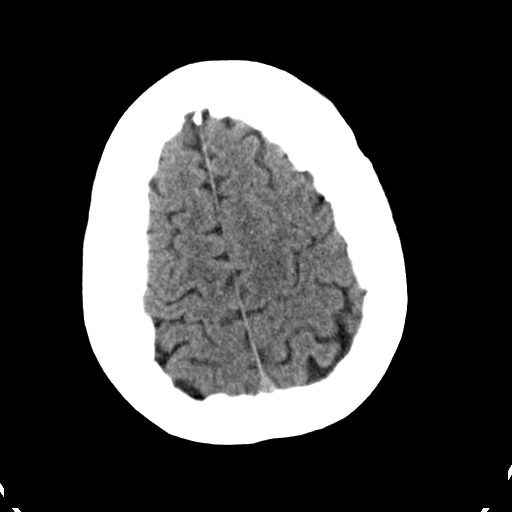
[im 25/30  brain]
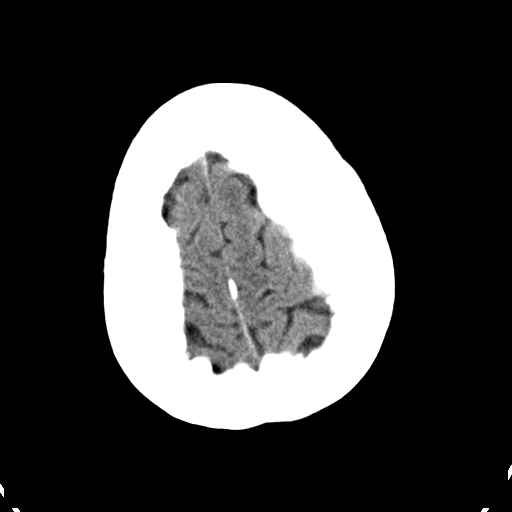
[im 27/30  brain]
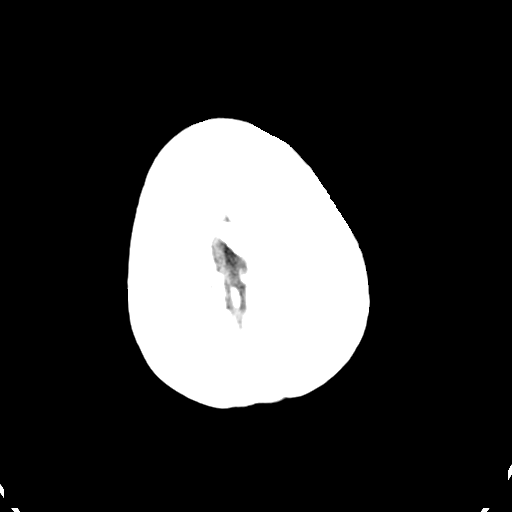
[im 27/30  bone]
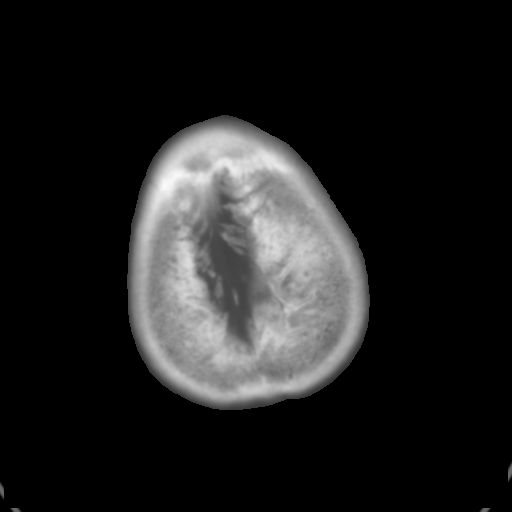

[Series 3: bone · axial · 0.39mm/px · z∈[+1241,+1281]mm · 3 of 30 slices shown]
[im 3/30  bone]
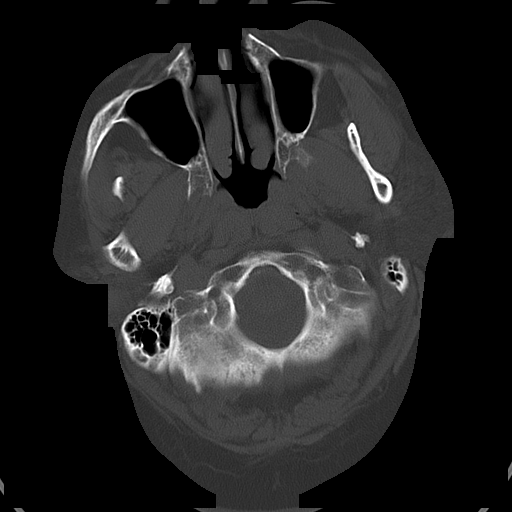
[im 7/30  bone]
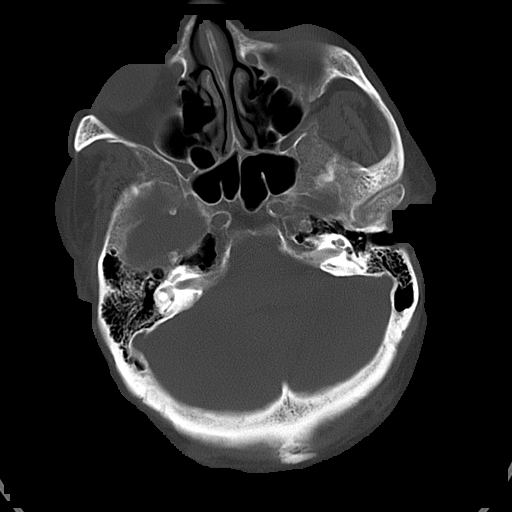
[im 11/30  bone]
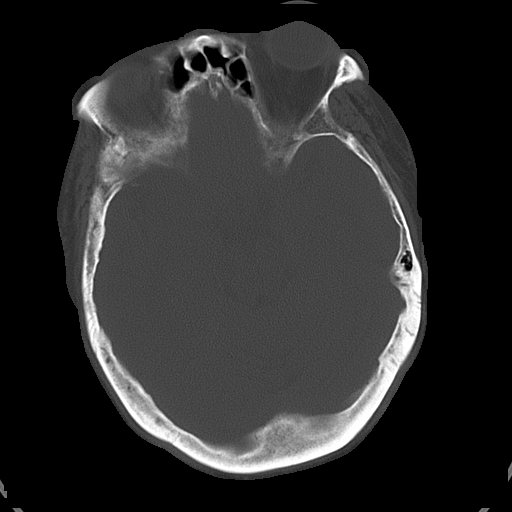

[16 of 30 positions shown; findings below may reference images not displayed]

PROCEDURE:     CT  - CT HEAD WITHOUT CONTRAST  - July 08, 2007  [DATE]

RESULT:     Comparison is made to a study 08 June, 2007.

The ventricles are normal in size and position. There is no evidence of an
intracranial hemorrhage, mass, or mass-effect. There is dense calcification
in the basal ganglia which is not new. The cerebellum and brainstem exhibit
normal density. At bone window settings, the observed portions of the
paranasal sinuses are clear. There is hyperostosis of the inner table of the
frontal and parietal bones. I see no skull fracture.
IMPRESSION: I see no acute intracranial abnormality.

This report was called directly to Dr. Asmart in the Emergency Room at the
conclusion of the study.

## 2009-10-30 IMAGING — CR DG CHEST 2V
1 series · 2 of 2 positions shown · non-contrast
Comparison: none

REASON FOR EXAM: fell pain
COMMENTS:

[Series 1: view not recorded · 0.17mm/px · 2 of 2 slices shown]
[im 1/2]
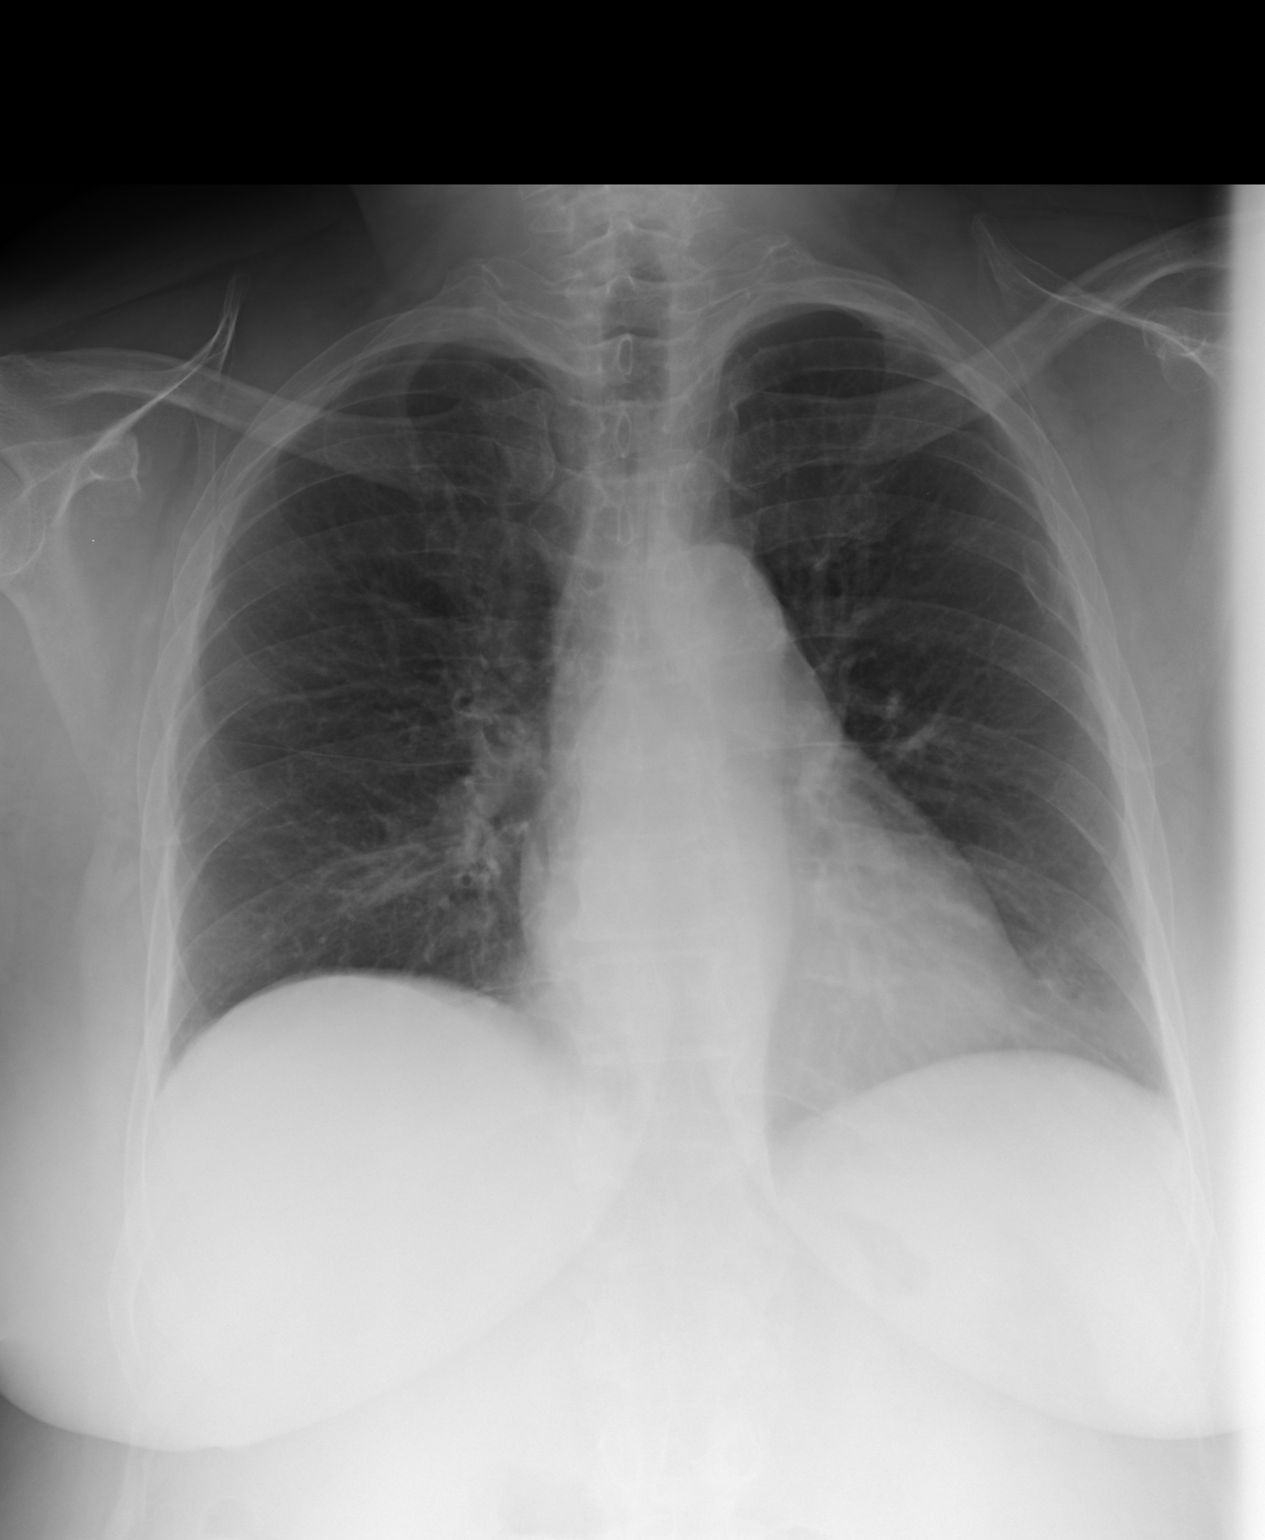
[im 2/2]
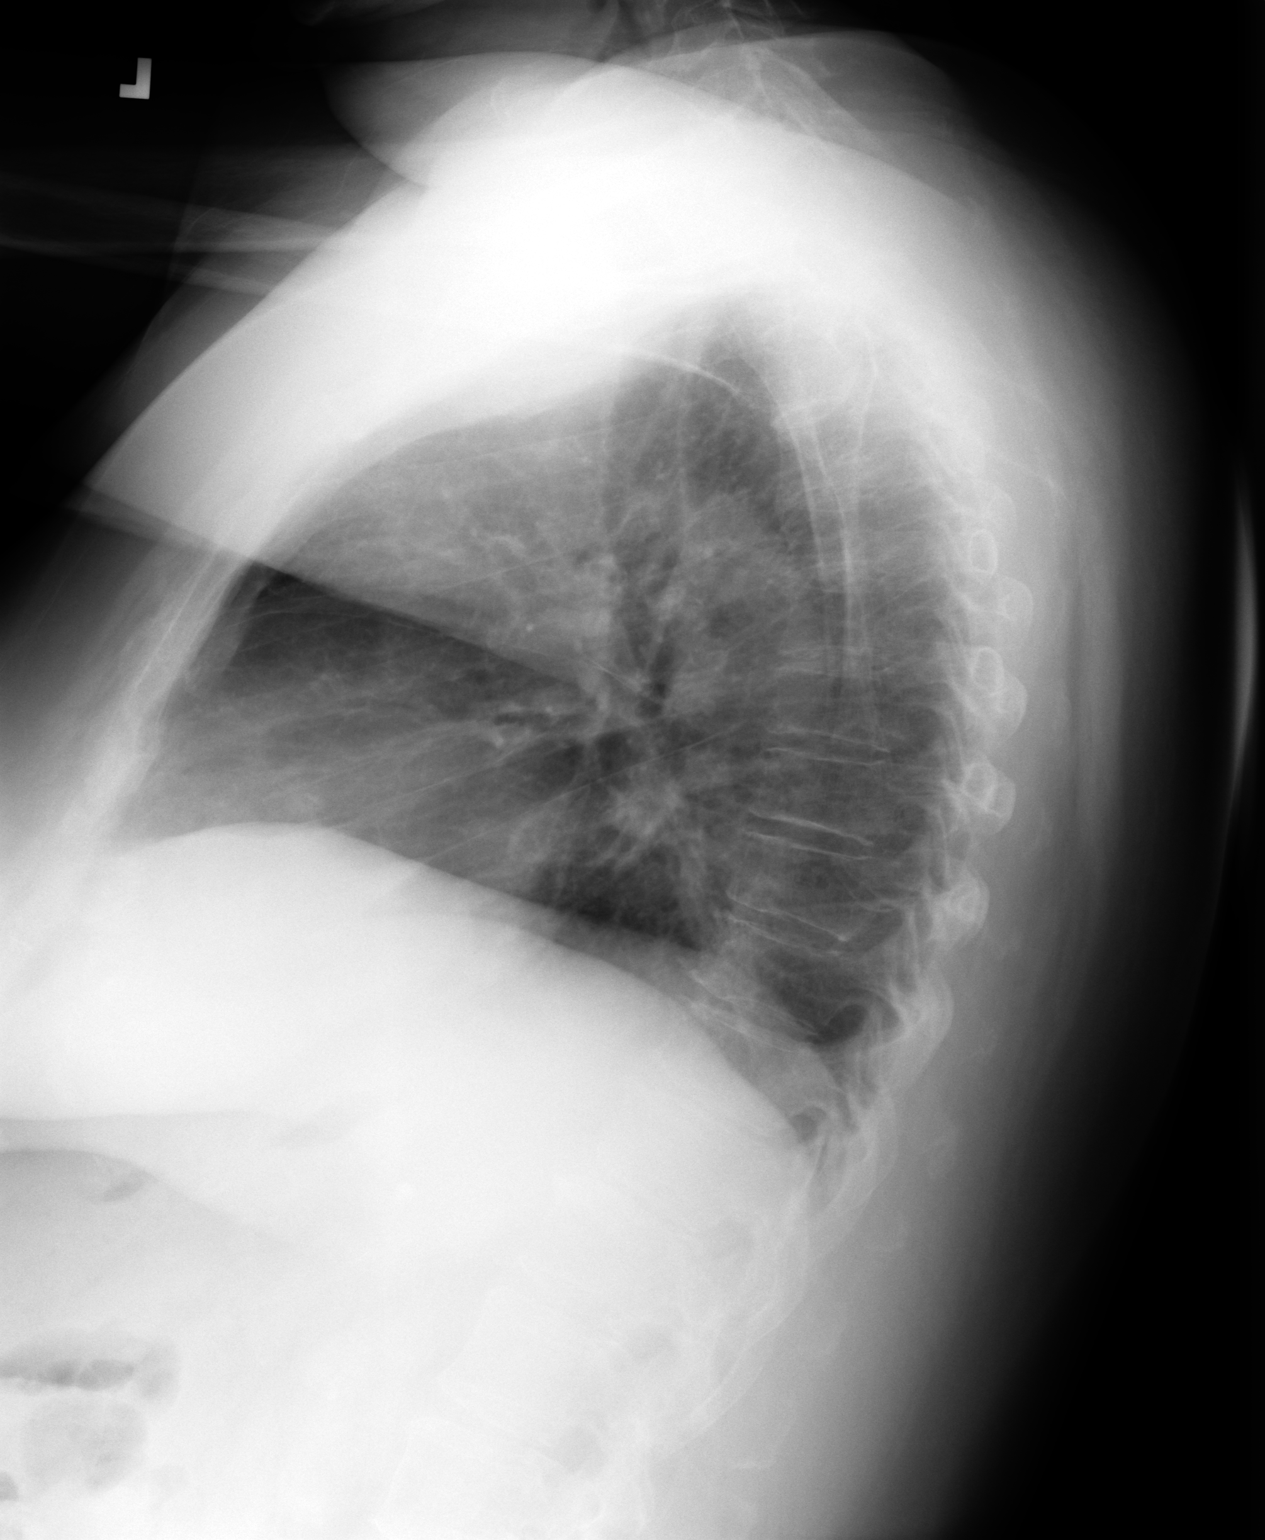

[2 of 2 positions shown; findings below may reference images not displayed]

PROCEDURE:     DXR - DXR CHEST PA (OR AP) AND LATERAL  - July 08, 2007  [DATE]

RESULT:     Comparison is made to the exam dated 05/21/2007.

The lungs are clear. The heart and pulmonary vessels are normal. The bony
and mediastinal structures are unremarkable. There is no effusion. There is
no pneumothorax or evidence of congestive failure.
IMPRESSION: No acute cardiopulmonary disease.

## 2009-10-30 IMAGING — CR DG SHOULDER 3+V*R*
1 series · 4 of 4 positions shown · non-contrast
Comparison: none

REASON FOR EXAM: fell,pain
COMMENTS:

PROCEDURE:     DXR - DXR SHOULDER RIGHT COMPLETE  - July 08, 2007  [DATE]
RESULT:     Images of the RIGHT shoulder demonstrate no definite fracture,
dislocation or radiopaque foreign body. The bones are mildly osteopenic.

[Series 1: view not recorded · 0.17mm/px · 4 of 4 slices shown]
[im 1/4]
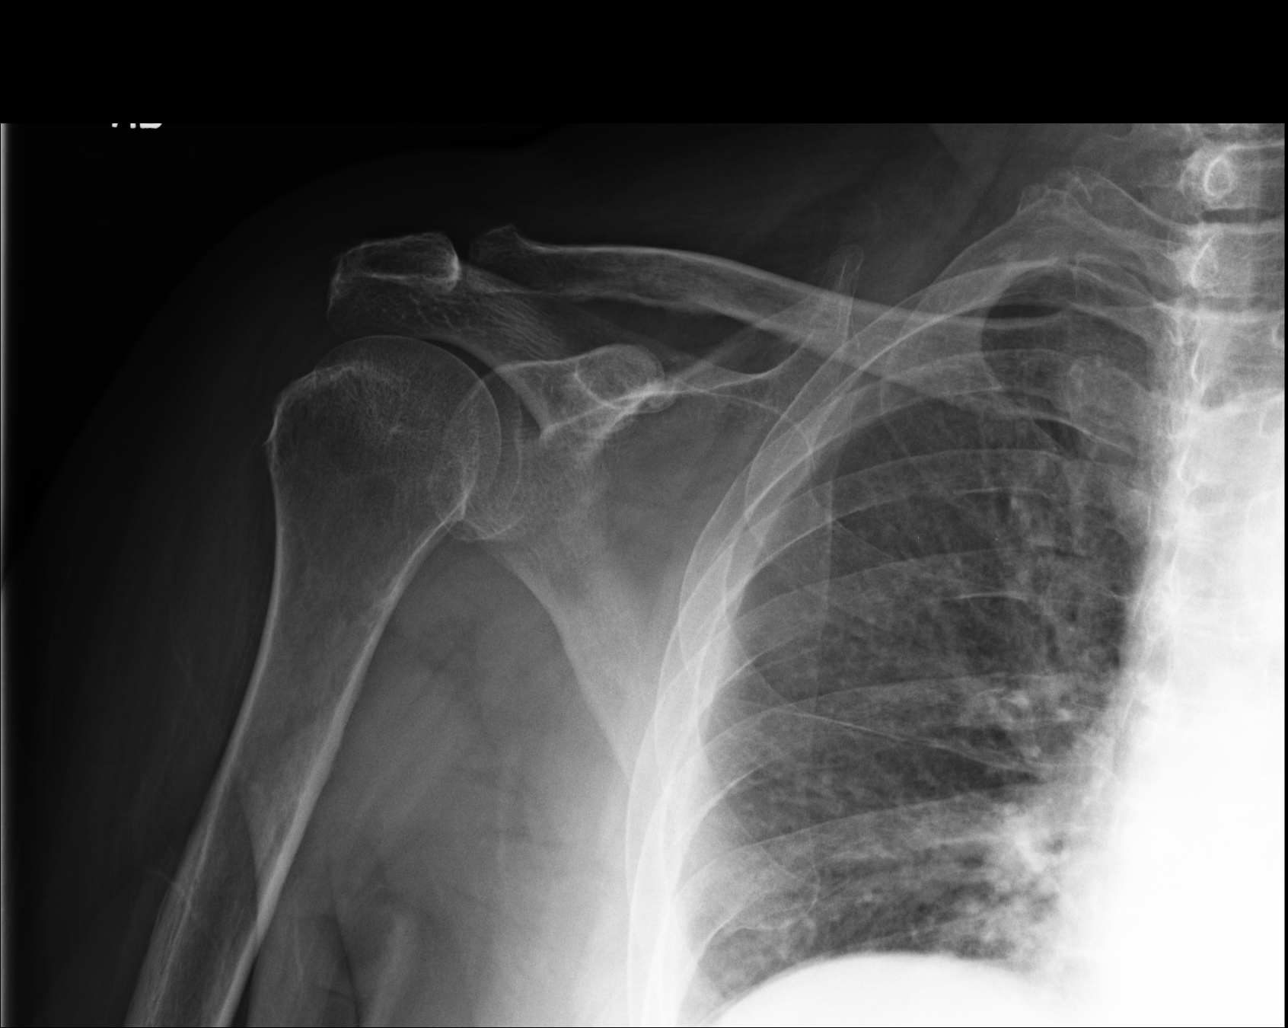
[im 2/4]
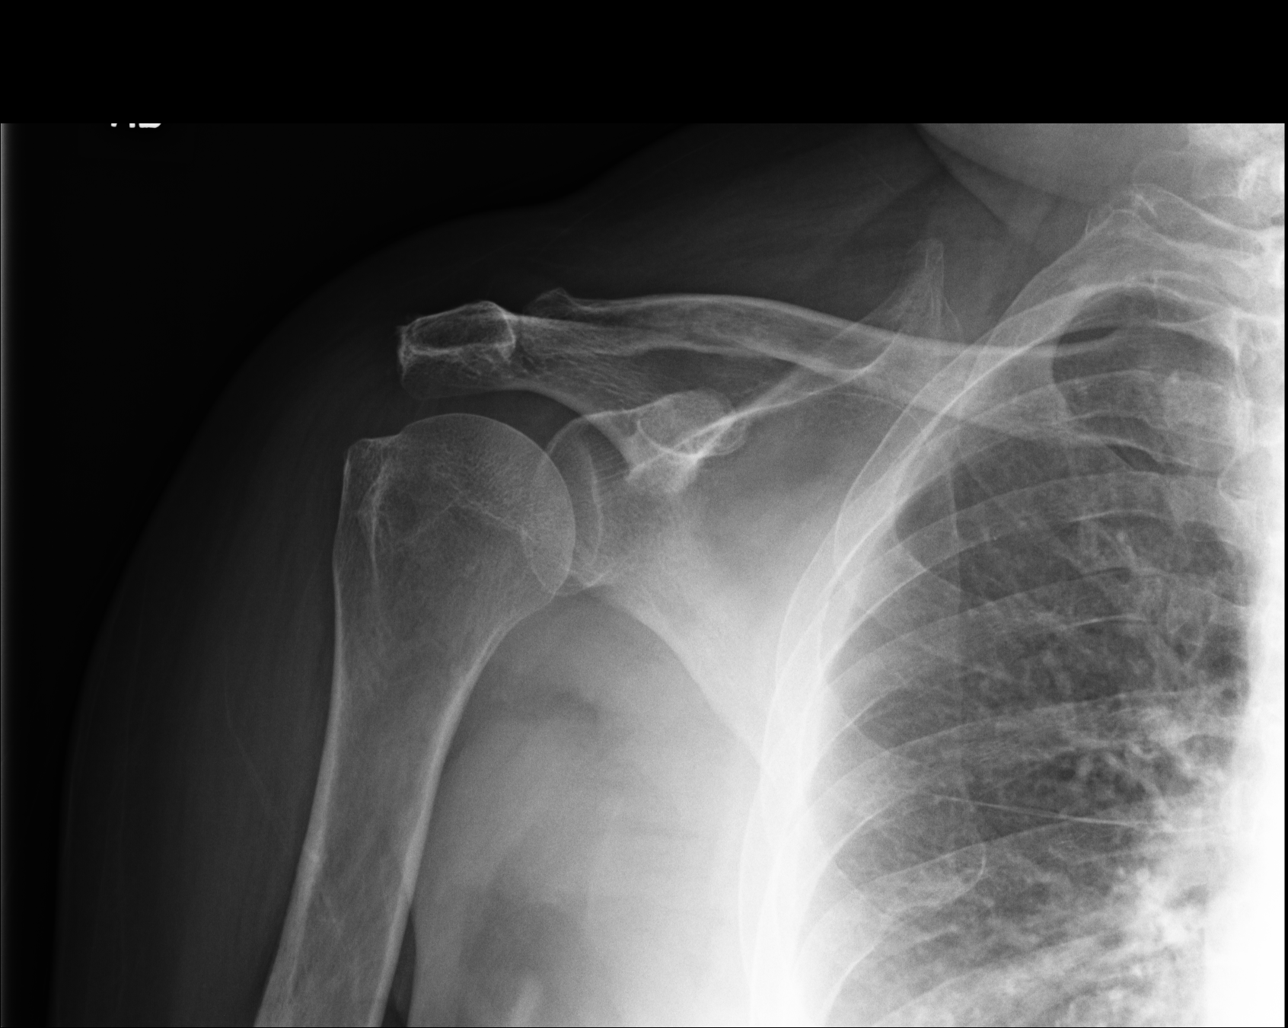
[im 3/4]
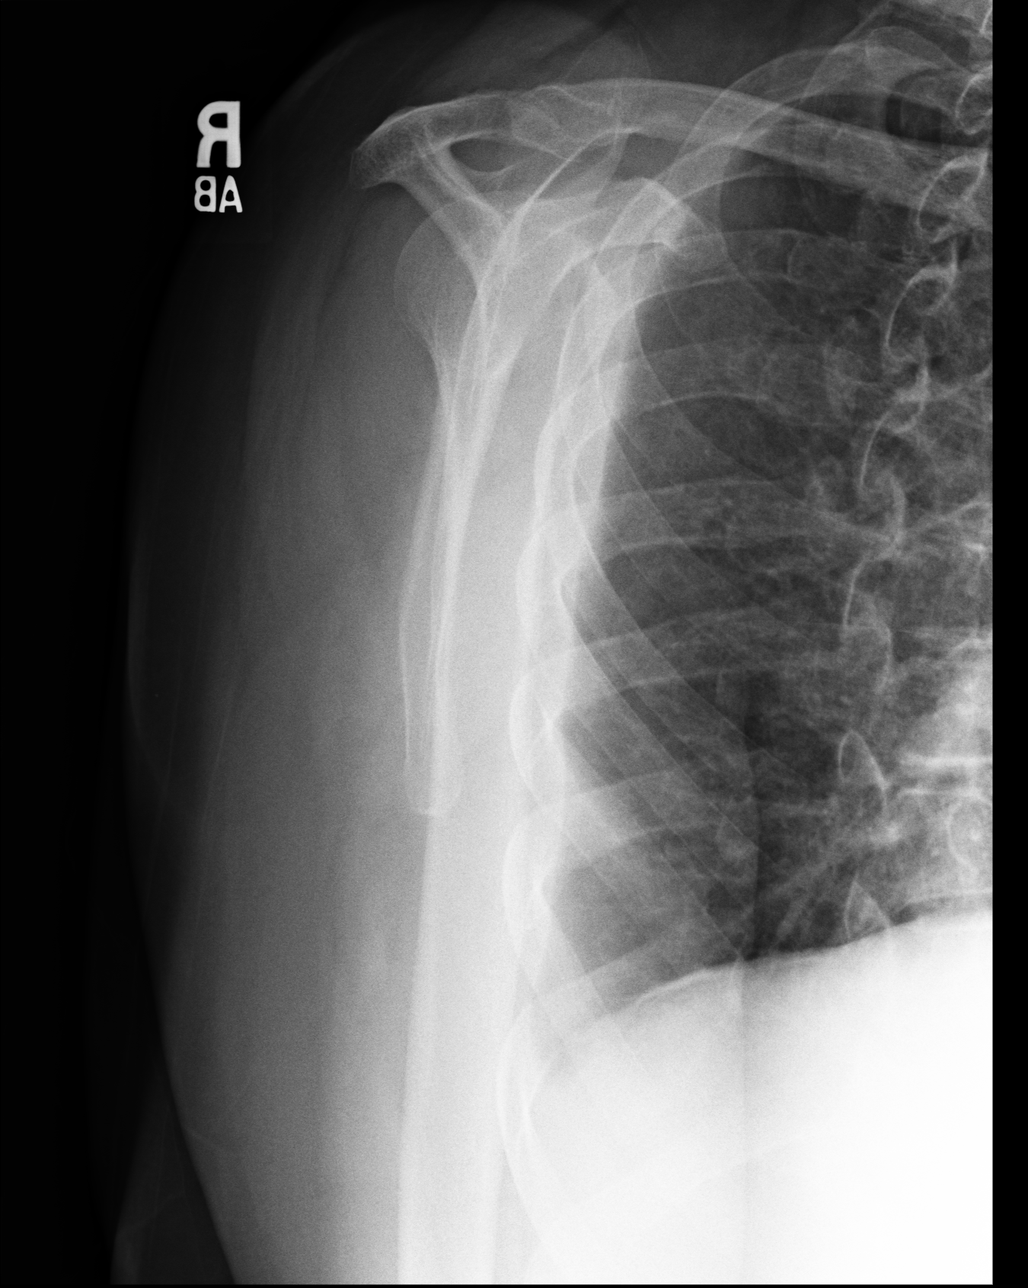
[im 4/4]
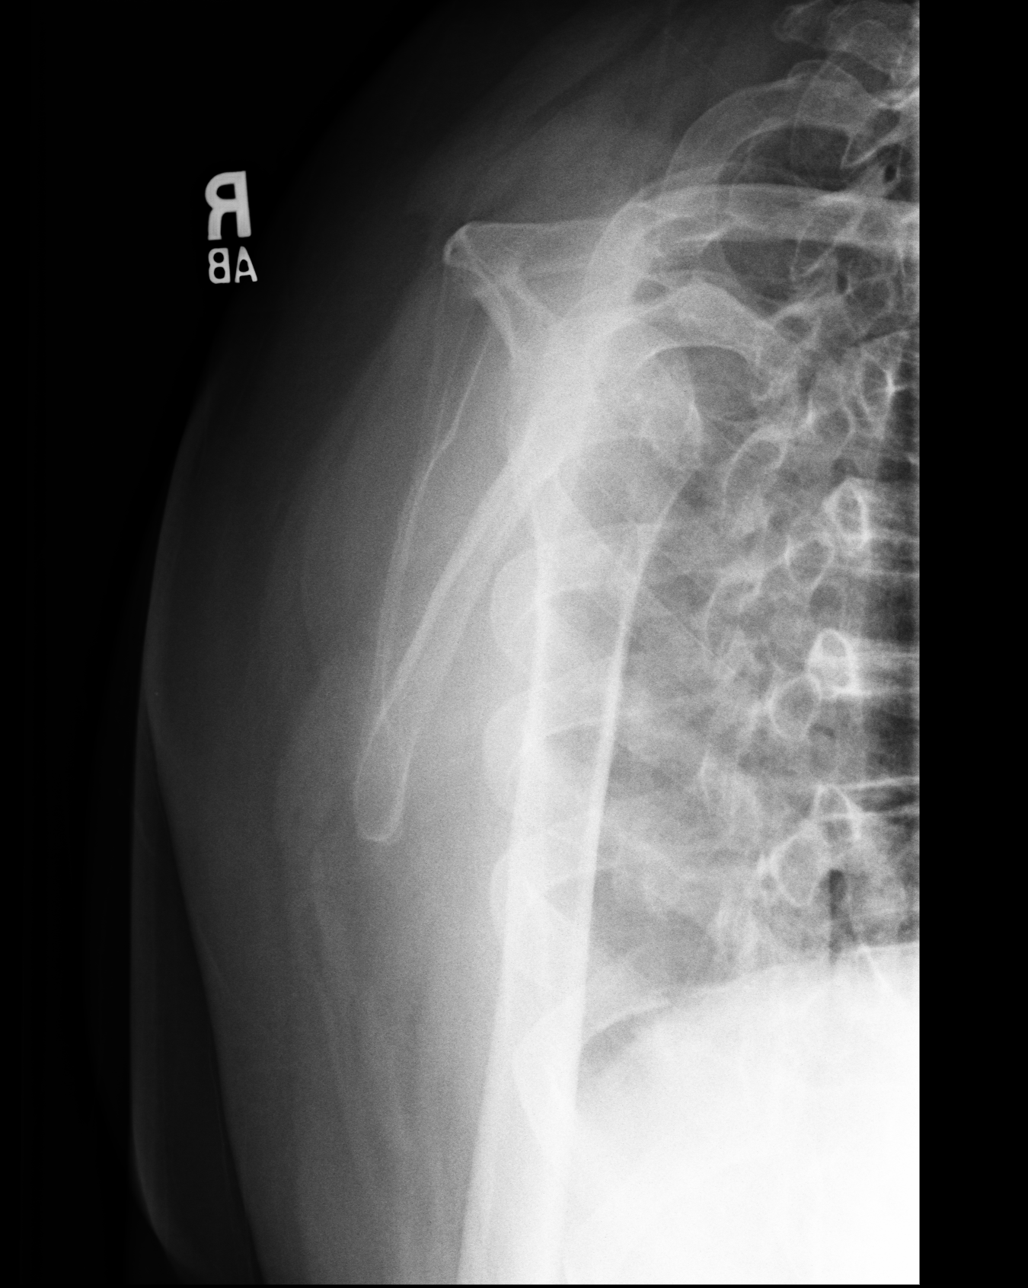

[4 of 4 positions shown; findings below may reference images not displayed]

IMPRESSION: No definite fracture identified. No dislocation evident.

## 2009-11-07 IMAGING — CR RIGHT ELBOW - COMPLETE 3+ VIEW
1 series · 4 of 4 positions shown · non-contrast
Comparison: none

REASON FOR EXAM: pain
COMMENTS:

[Series 1: view not recorded · 0.17mm/px · 4 of 4 slices shown]
[im 1/4]
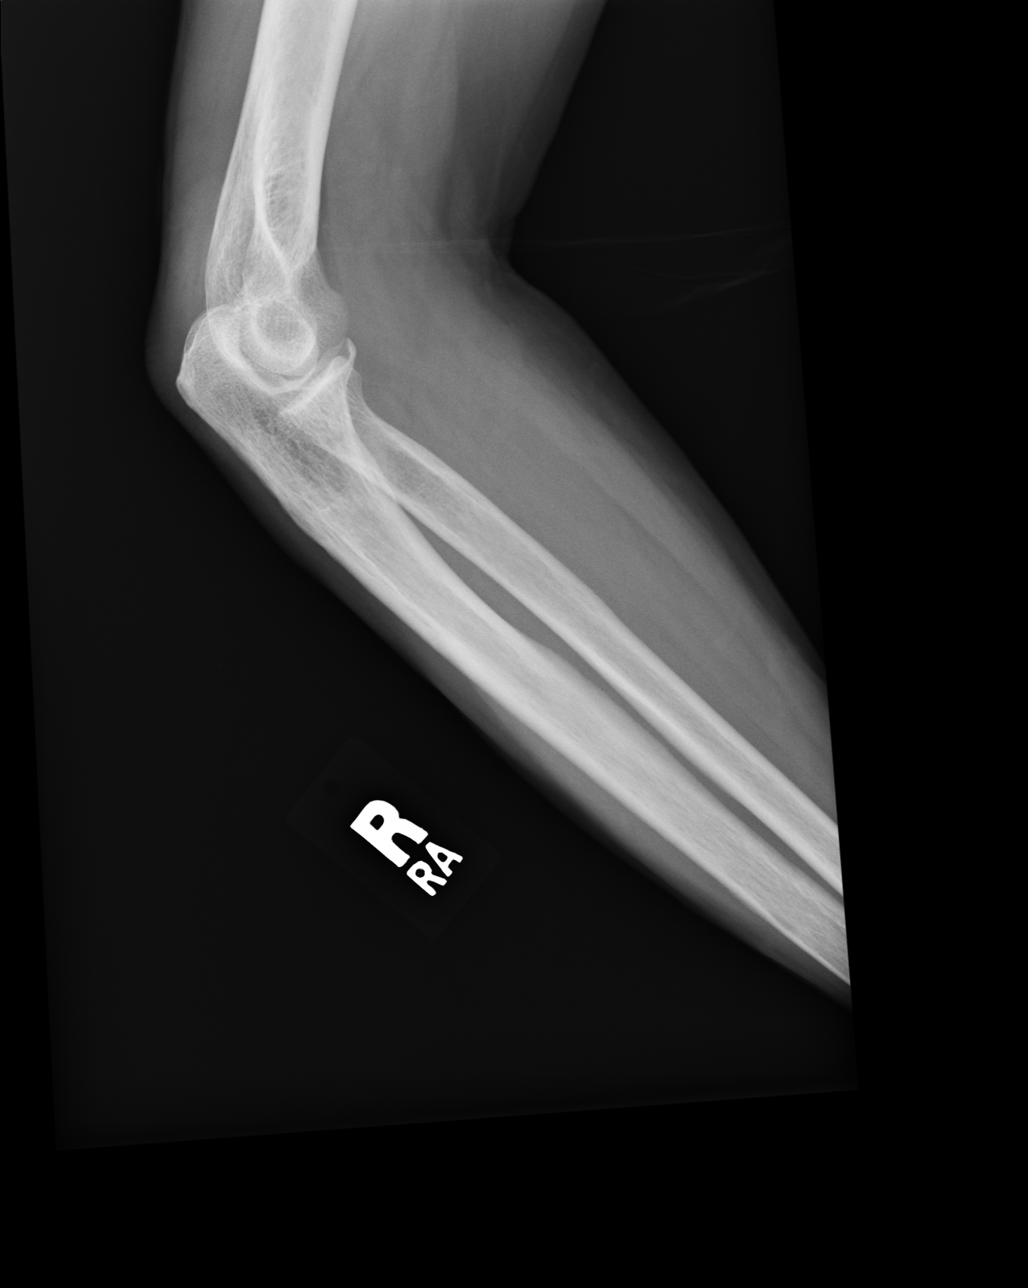
[im 2/4]
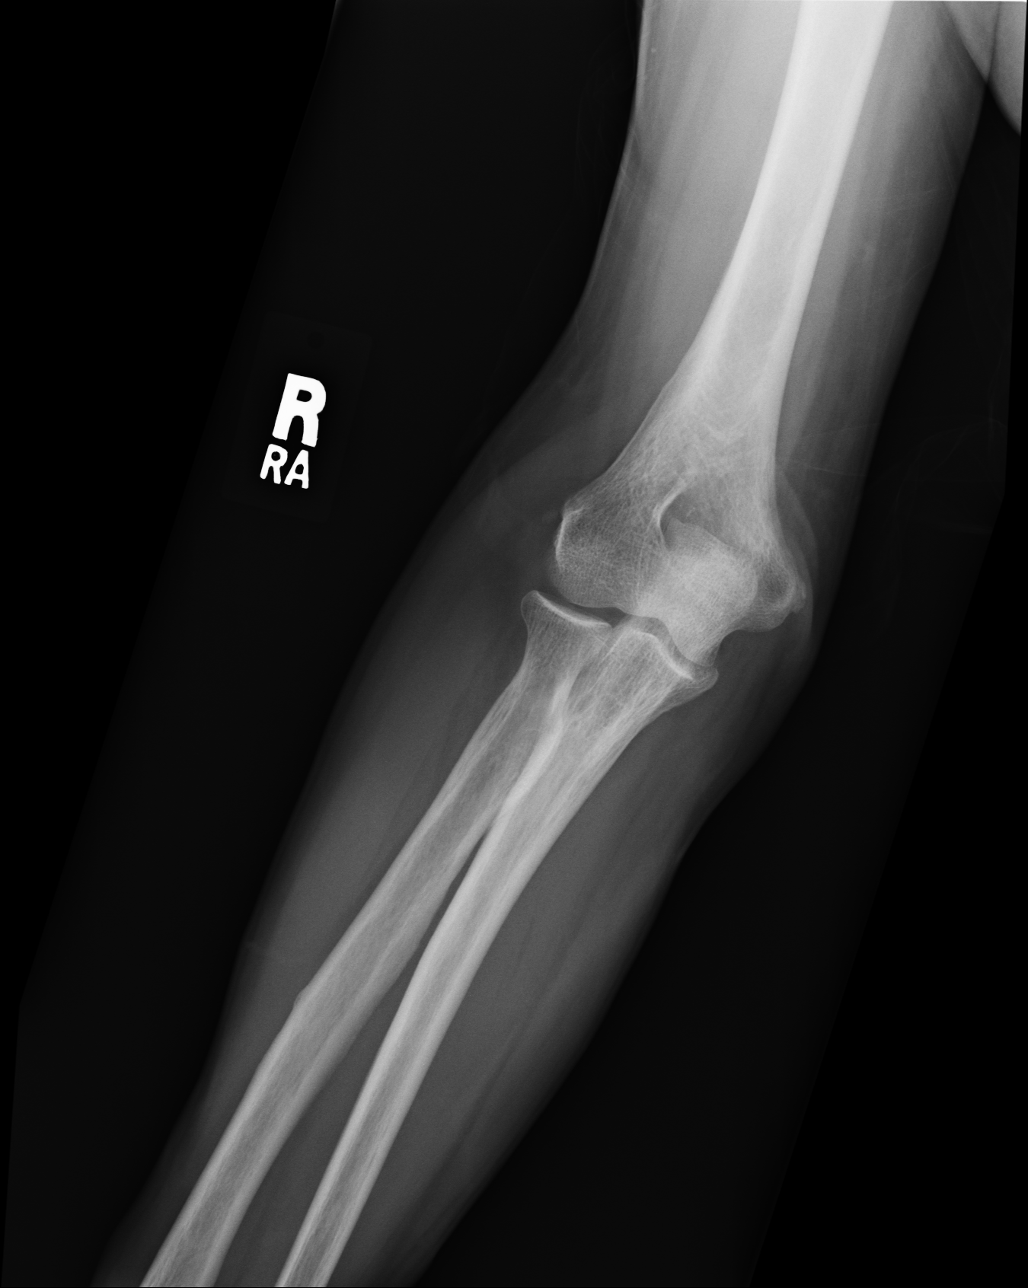
[im 3/4]
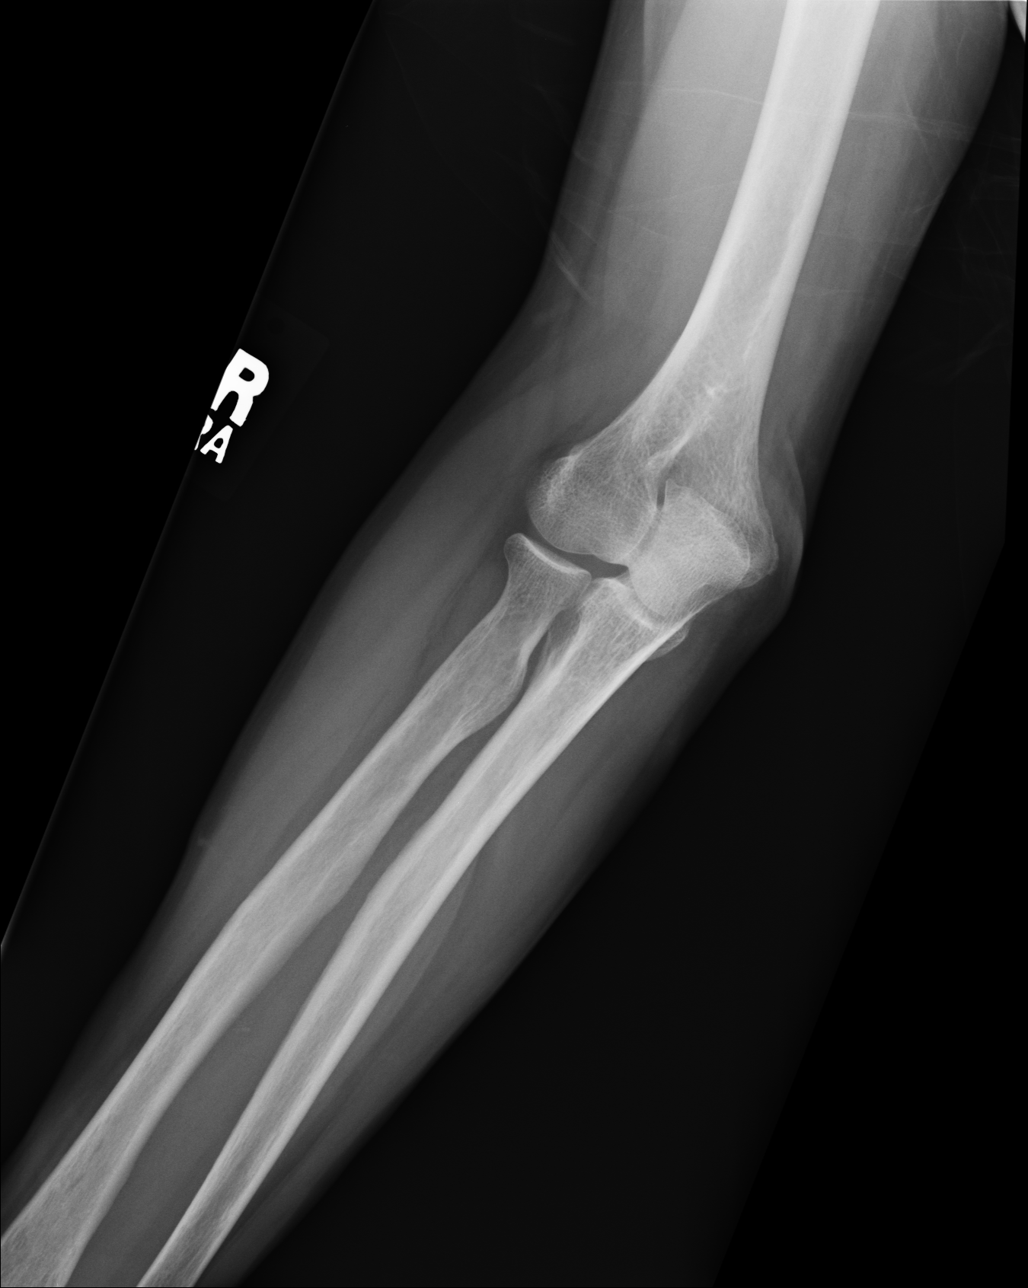
[im 4/4]
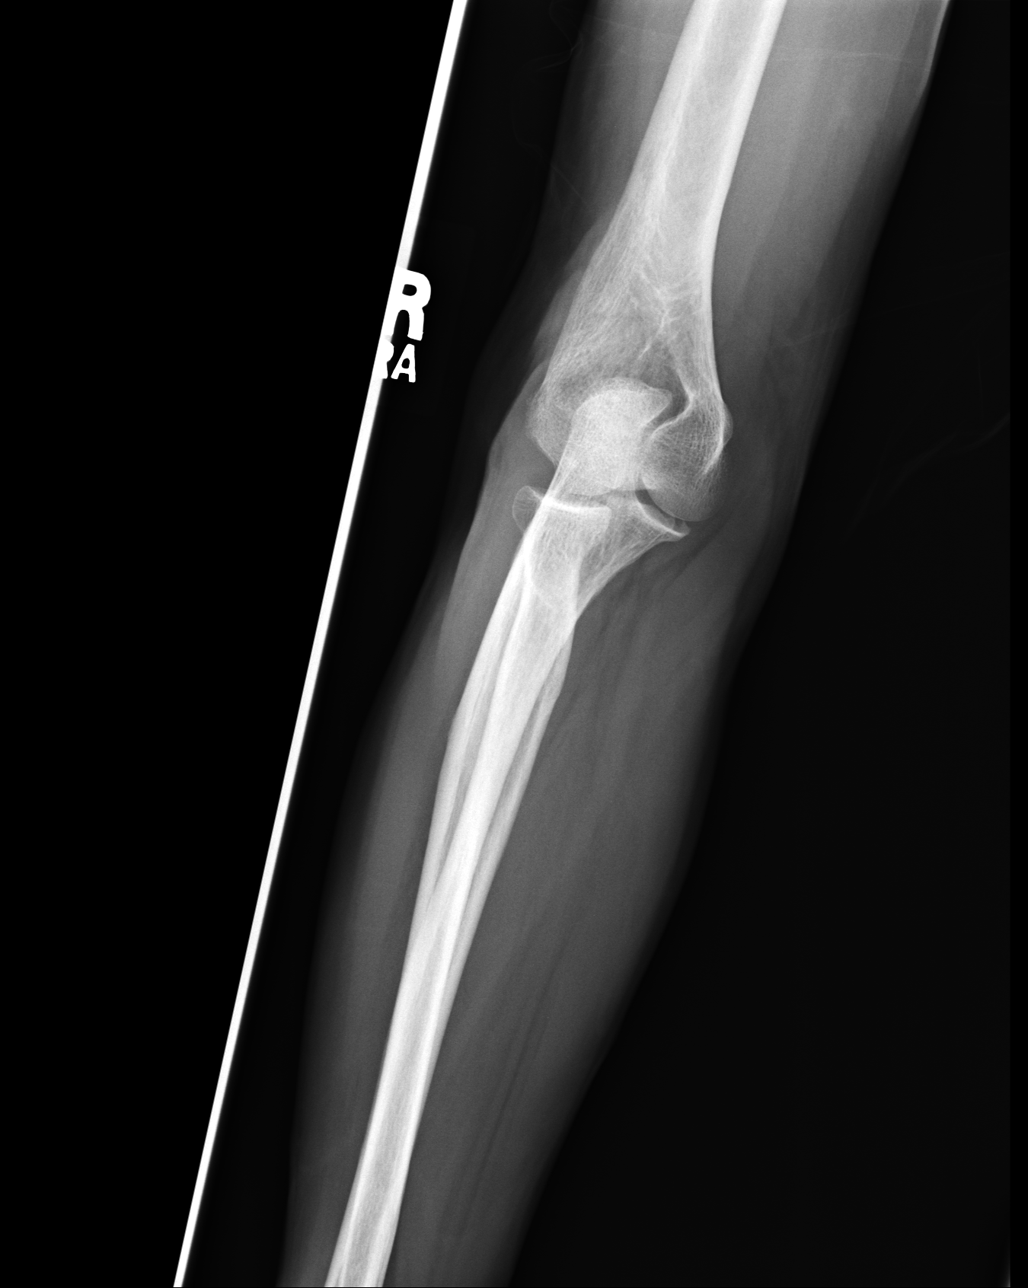

[4 of 4 positions shown; findings below may reference images not displayed]

PROCEDURE:     DXR - DXR ELBOW RT COMP W/OBLIQUES  - July 16, 2007  [DATE]

RESULT:     There does not appear to be evidence of acute fracture or
dislocation. Degenerative changes are appreciated evident by osteophytes
along the olecranon. There does not appear to be evidence of anterior or
posterior fat pad sign.
IMPRESSION: No CT evidence of acute osseous abnormalities. If there is persistent
clinical concern, repeat evaluation in 7-10 days is recommended.

## 2009-11-07 IMAGING — CR DG HUMERUS 2V *R*
1 series · 3 of 3 positions shown · non-contrast
Comparison: none

REASON FOR EXAM: pain
COMMENTS:

PROCEDURE:     DXR - DXR HUMERUS RIGHT  - July 16, 2007  [DATE]
RESULT:       There is no evidence of acute fracture, dislocation or
malalignment. Mild degenerative change is appreciated along the humeral head.

[Series 1: view not recorded · 0.17mm/px · 3 of 3 slices shown]
[im 1/3]
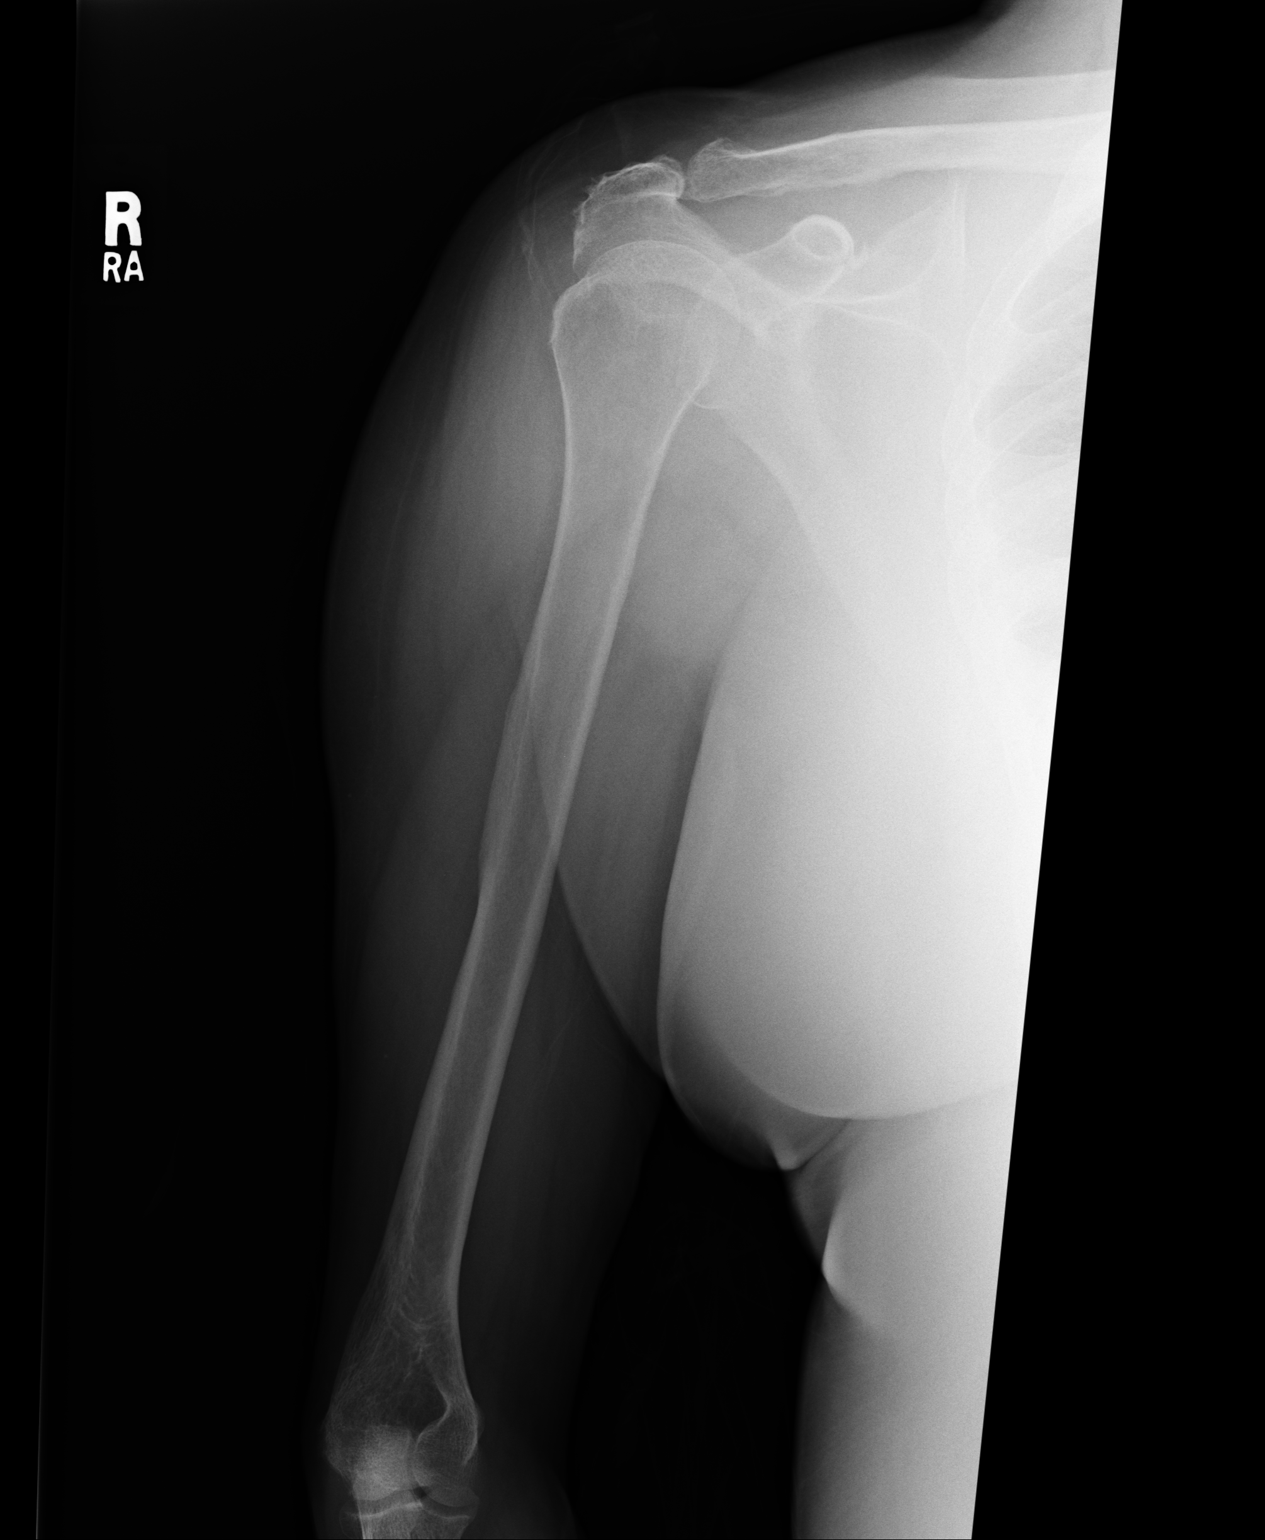
[im 2/3]
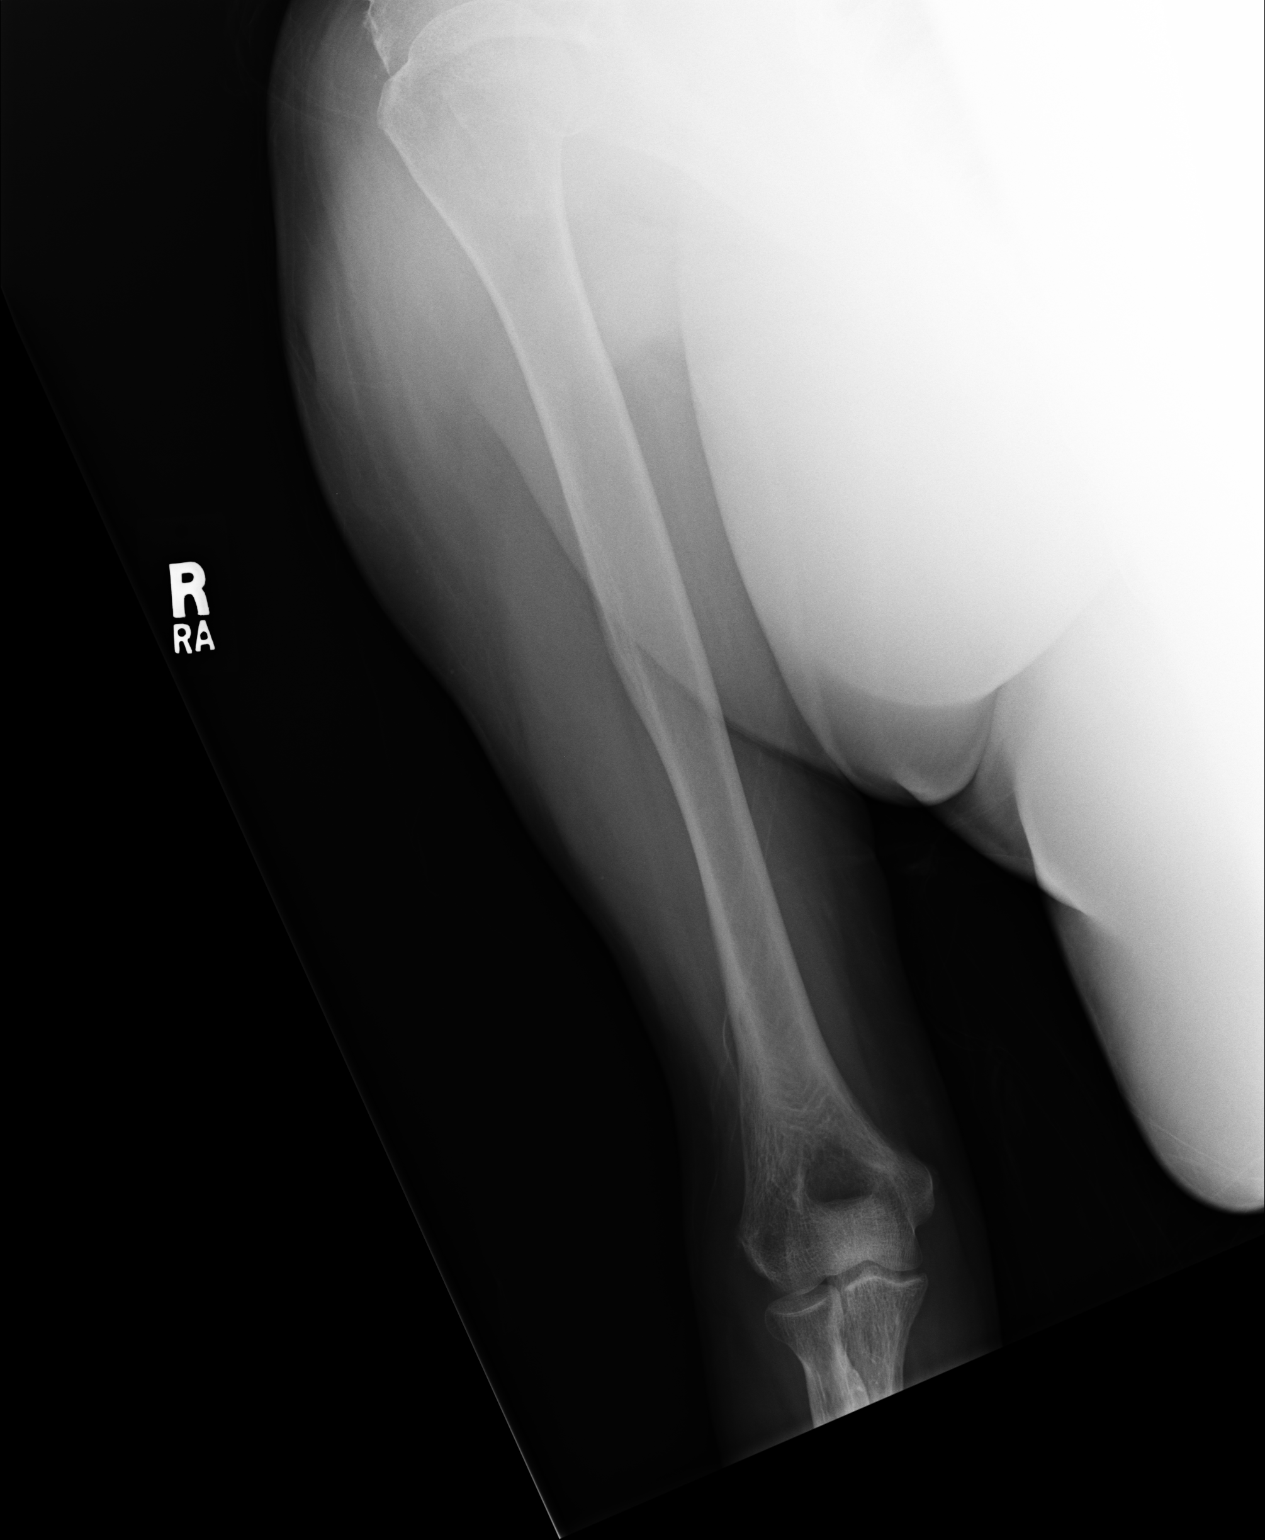
[im 3/3]
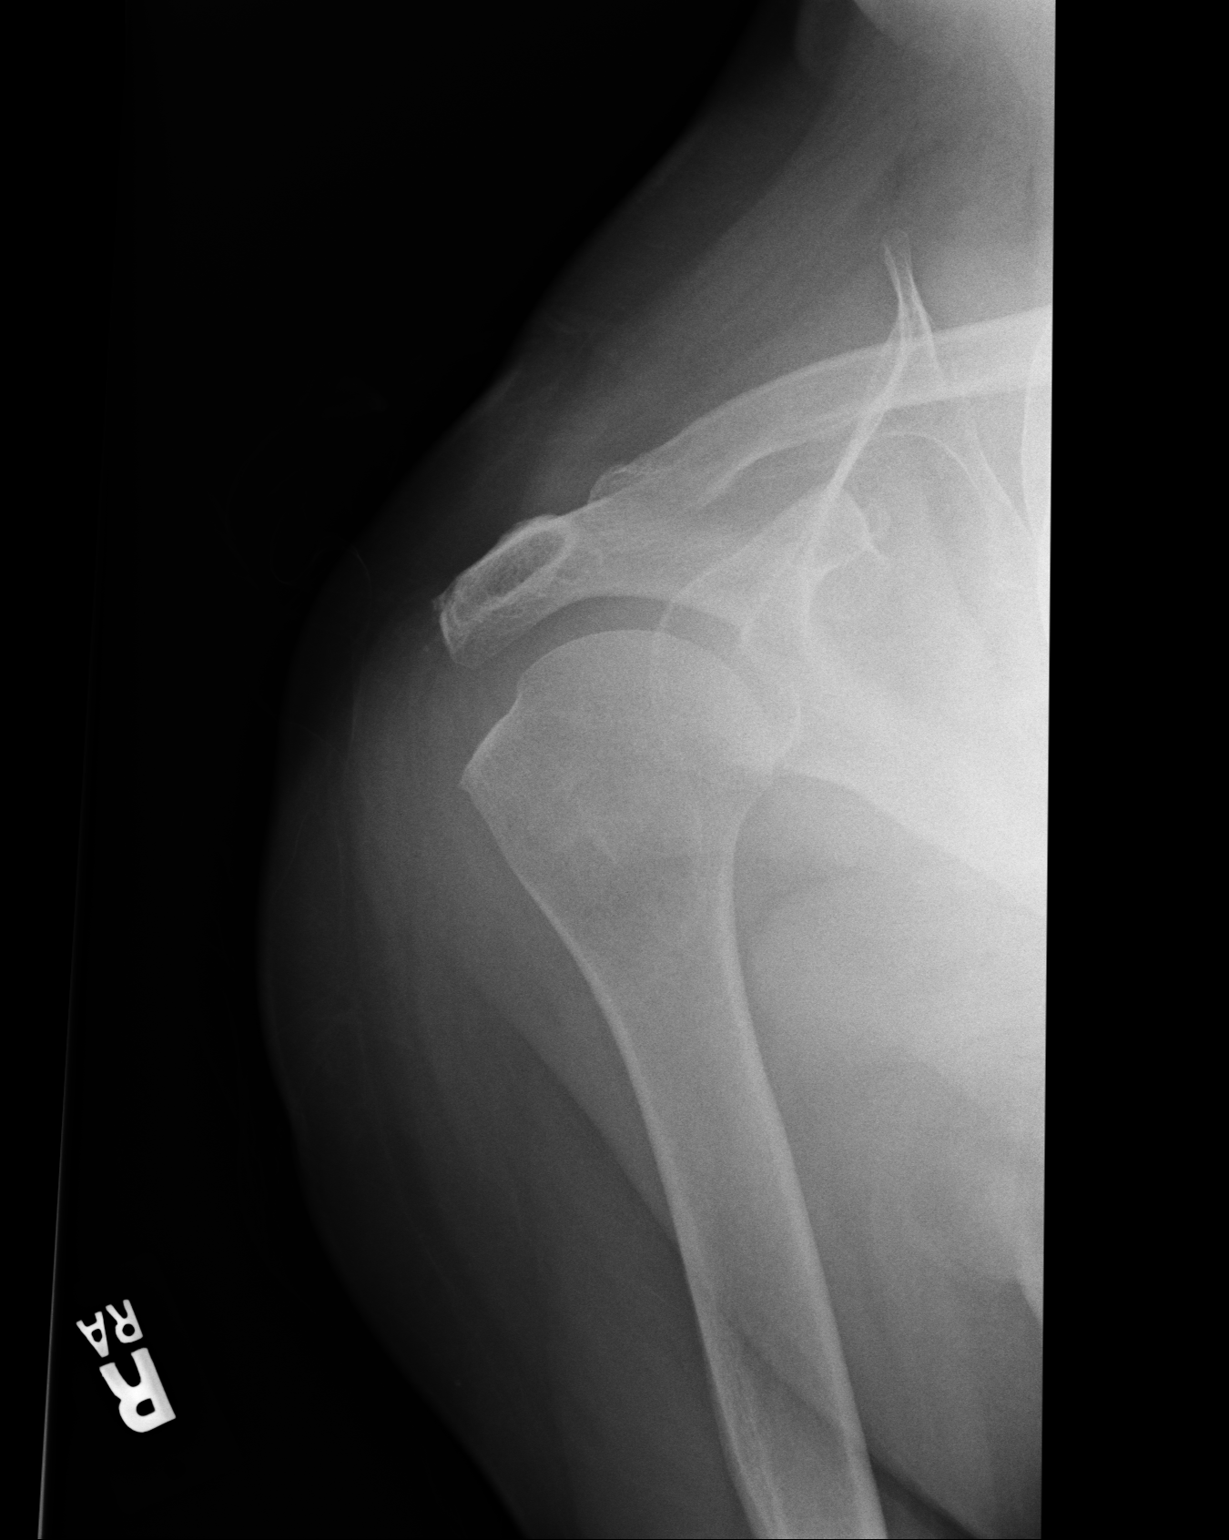

[3 of 3 positions shown; findings below may reference images not displayed]

IMPRESSION: No evidence of acute osseous abnormalities. If there is persistent clinical
concern, repeat evaluation in 7 to 10 days is recommended if and as
clinically warranted.

## 2009-11-09 DIAGNOSIS — E1351 Other specified diabetes mellitus with diabetic peripheral angiopathy without gangrene: Secondary | ICD-10-CM

## 2009-11-09 HISTORY — DX: Other specified diabetes mellitus with diabetic peripheral angiopathy without gangrene: E13.51

## 2009-11-18 IMAGING — MR MRI OF THE RIGHT SHOULDER WITHOUT CONTRAST
5 of 6 series · 29 of 40 positions shown · non-contrast
Comparison: none

REASON FOR EXAM: Shoulder pain, evaluate for rotator cuff tear
COMMENTS:

PROCEDURE:     MR  - MR SHOULDER RT  WO CONTRAST  - July 27, 2007  [DATE]
RESULT:     The patient has a history of pain.
TECHNIQUE: Multiplanar/multisequence imaging of the RIGHT shoulder is
obtained.

[Series 3: GRE · axial · 4.0mm · 0.35mm/px · z∈[-27,+200]mm · 6 of 16 slices shown]
[im 1/16]
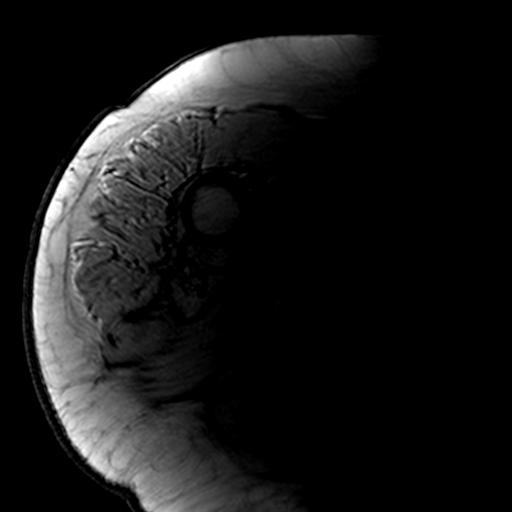
[im 4/16]
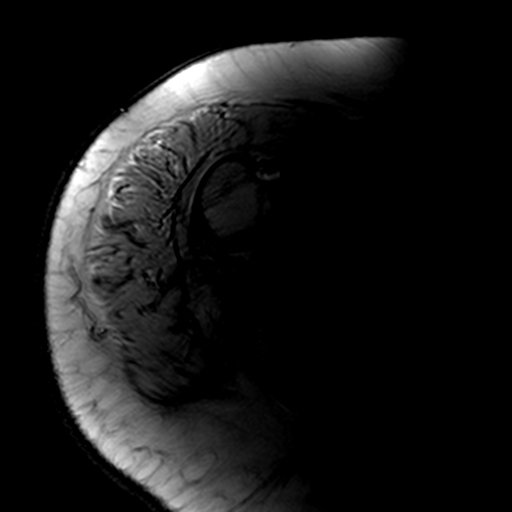
[im 7/16]
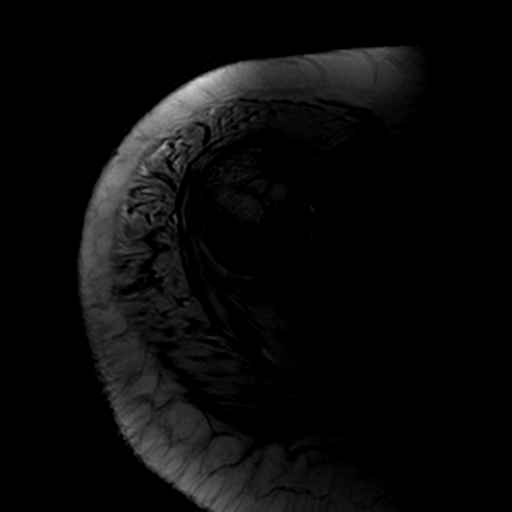
[im 10/16]
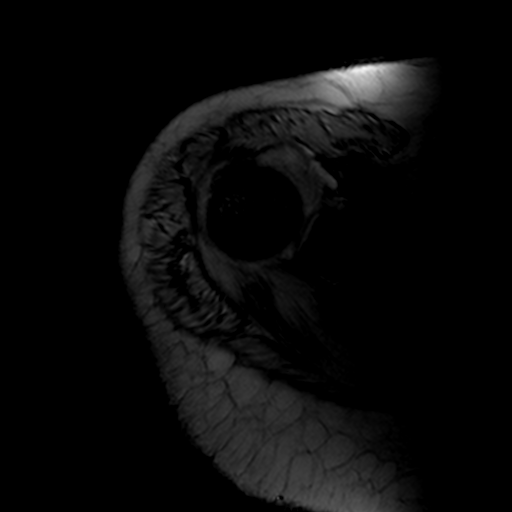
[im 13/16]
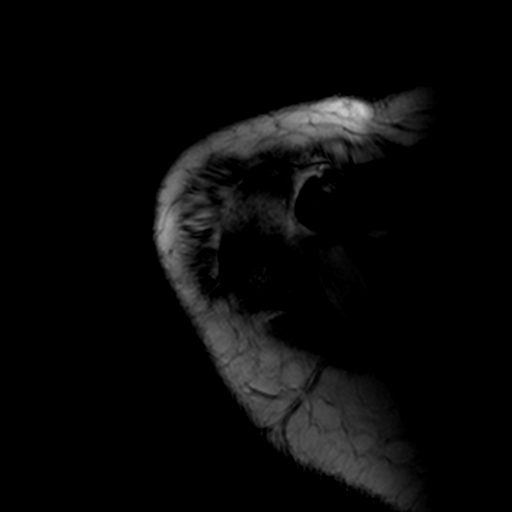
[im 16/16]
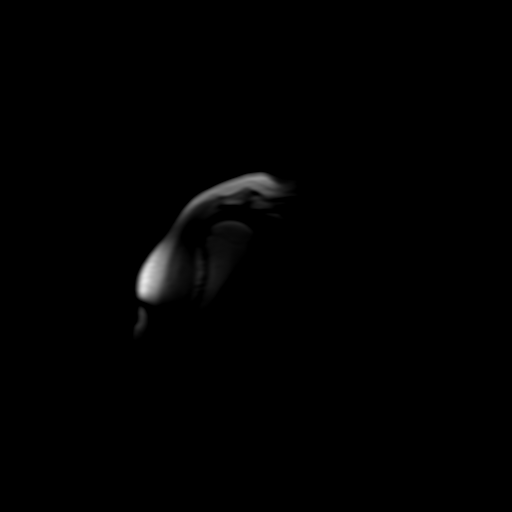

[Series 4: PD fat-sat · axial · 4.0mm · 0.62mm/px · z∈[-27,+200]mm · 6 of 16 slices shown]
[im 1/16]
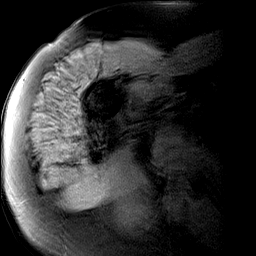
[im 4/16]
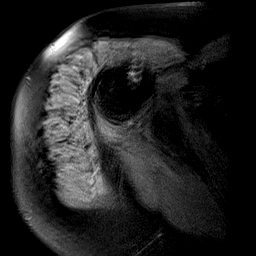
[im 7/16]
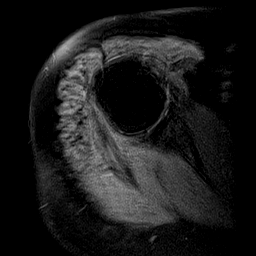
[im 10/16]
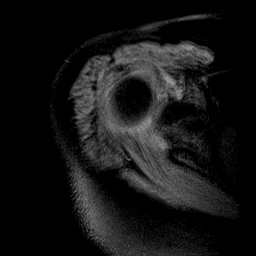
[im 13/16]
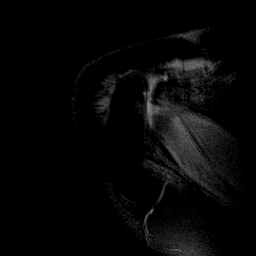
[im 16/16]
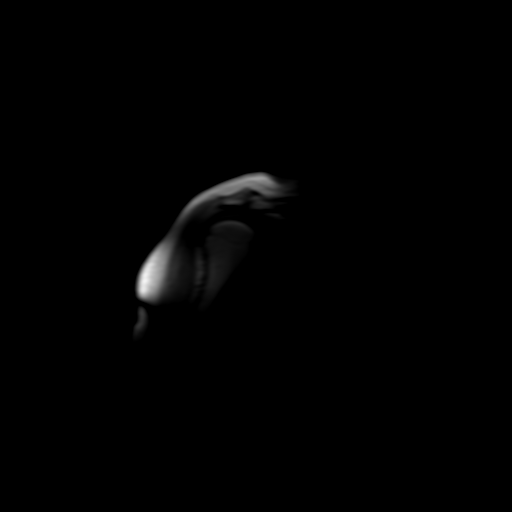

[Series 5: T2 fat-sat · axial · 4.0mm · 0.39mm/px · z∈[+9,+90]mm · 7 of 18 slices shown]
[im 1/18]
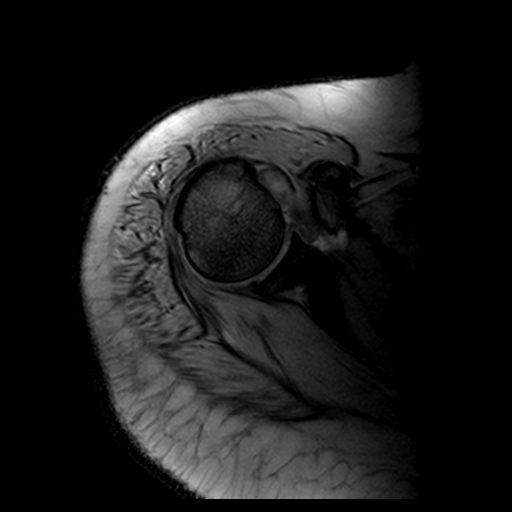
[im 3/18]
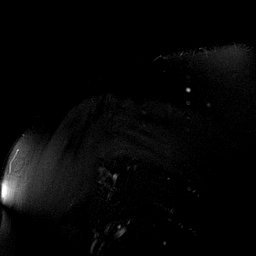
[im 6/18]
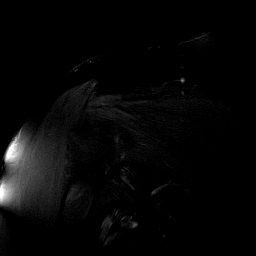
[im 9/18]
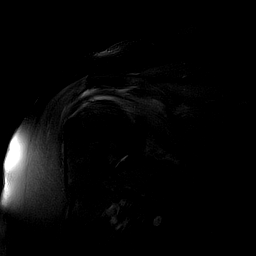
[im 12/18]
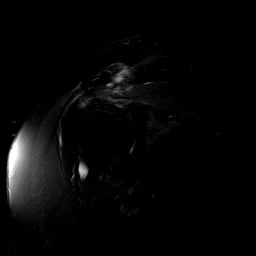
[im 15/18]
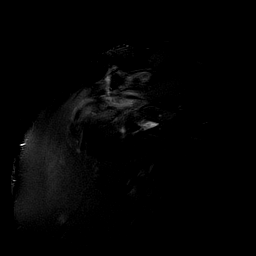
[im 18/18]
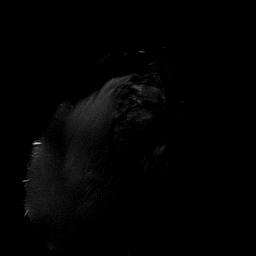

[Series 6: T1 · axial · 4.0mm · 0.41mm/px · z∈[+9,+100]mm · 7 of 18 slices shown]
[im 1/18]
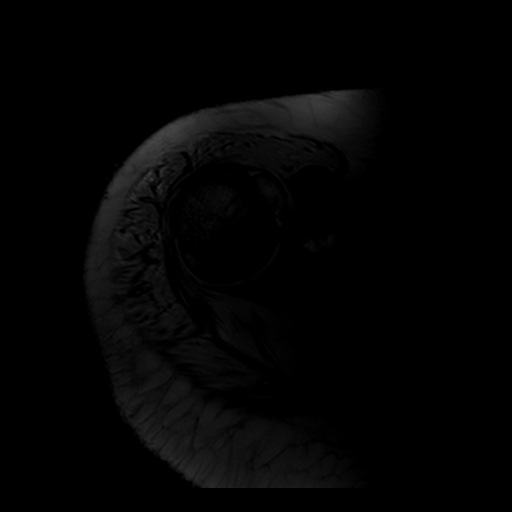
[im 3/18]
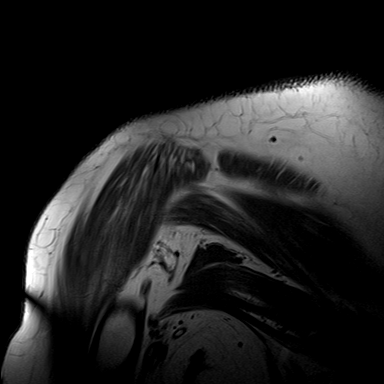
[im 6/18]
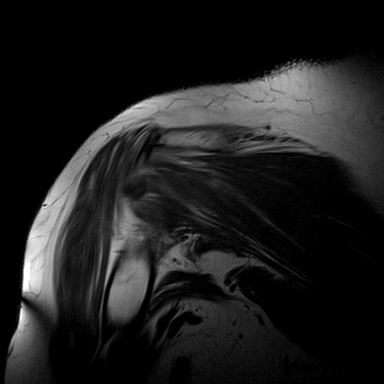
[im 9/18]
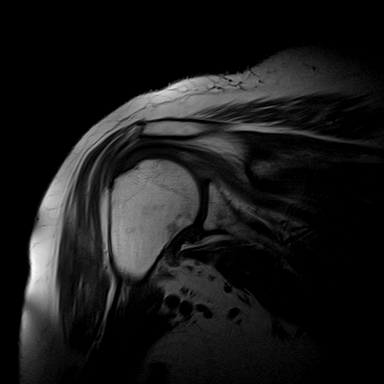
[im 12/18]
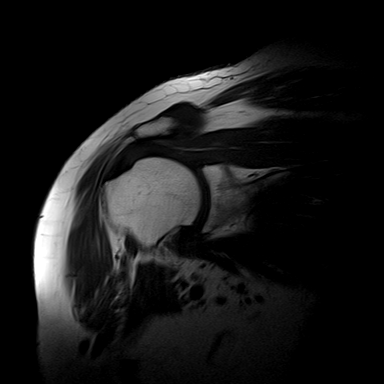
[im 15/18]
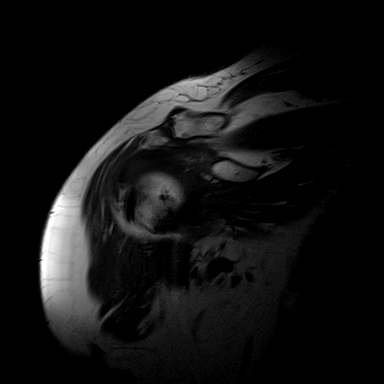
[im 18/18]
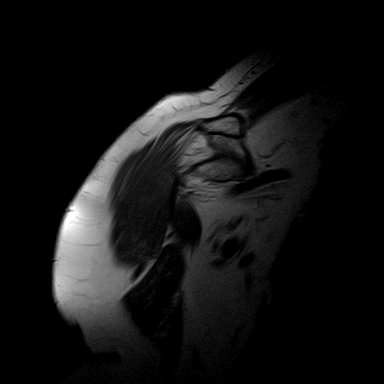

[Series 7: t1_se_sag · axial · 4.0mm · 0.38mm/px · z∈[+9,+90]mm · 3 of 20 slices shown]
[im 1/20]
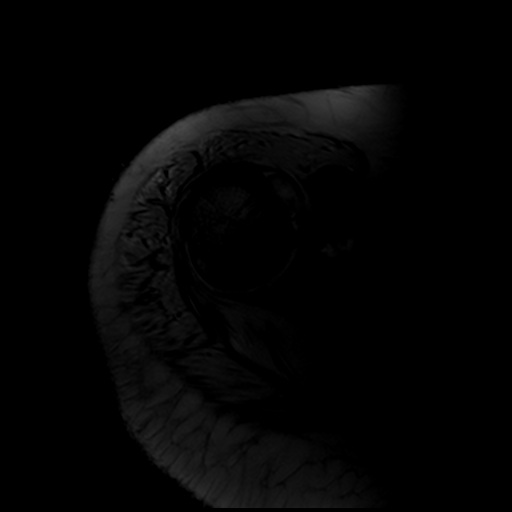
[im 4/20]
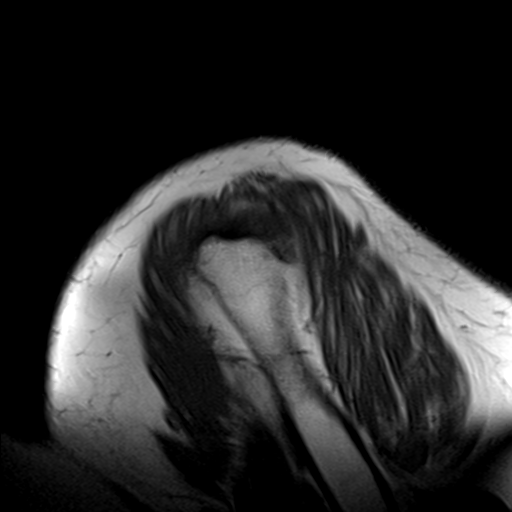
[im 7/20]
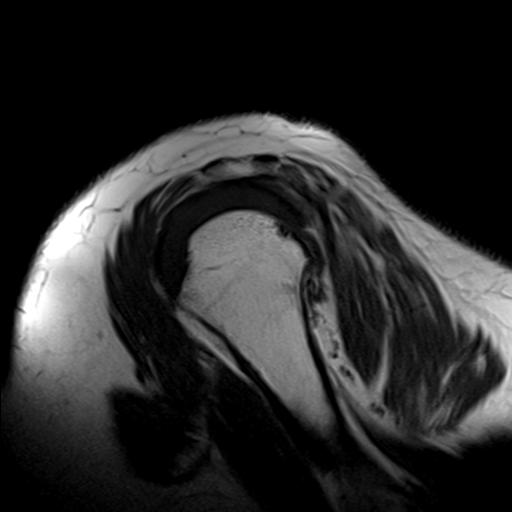

[29 of 40 positions shown; findings below may reference images not displayed]

FINDINGS: Acromioclavicular degenerative change is present with subacromial
spurring and downward sloping of the acromion. There is a high-grade,
partial, near complete tear of the supraspinatus tendon. An adhesed complete
tear cannot be excluded. Partial high-grade tear of the subscapularis tendon
is present. The infraspinatus and teres minor are intact. The long head of
the biceps is intact. The humeral head and glenoid labrum are intact.
IMPRESSION: 1.     Acromioclavicular degenerative changes with downward sloping acromion
and supraspinatus tendinous impingement with high-grade, near complete tear
of the supraspinatus tendon.
2.     High-grade partial tear of the subscapularis tendon.

## 2009-11-27 ENCOUNTER — Other Ambulatory Visit: Payer: Self-pay | Admitting: Internal Medicine

## 2009-11-29 ENCOUNTER — Ambulatory Visit: Payer: Self-pay | Admitting: Internal Medicine

## 2010-01-01 ENCOUNTER — Emergency Department: Payer: Self-pay | Admitting: Internal Medicine

## 2010-01-14 ENCOUNTER — Emergency Department: Payer: Self-pay | Admitting: Emergency Medicine

## 2010-01-15 ENCOUNTER — Emergency Department: Payer: Self-pay | Admitting: Emergency Medicine

## 2010-02-11 IMAGING — CR DG CHEST 2V
1 series · 2 of 2 positions shown · non-contrast
Comparison: none

REASON FOR EXAM: [DATE]--HTN
COMMENTS:

[Series 1: view not recorded · 0.17mm/px · 2 of 2 slices shown]
[im 1/2]
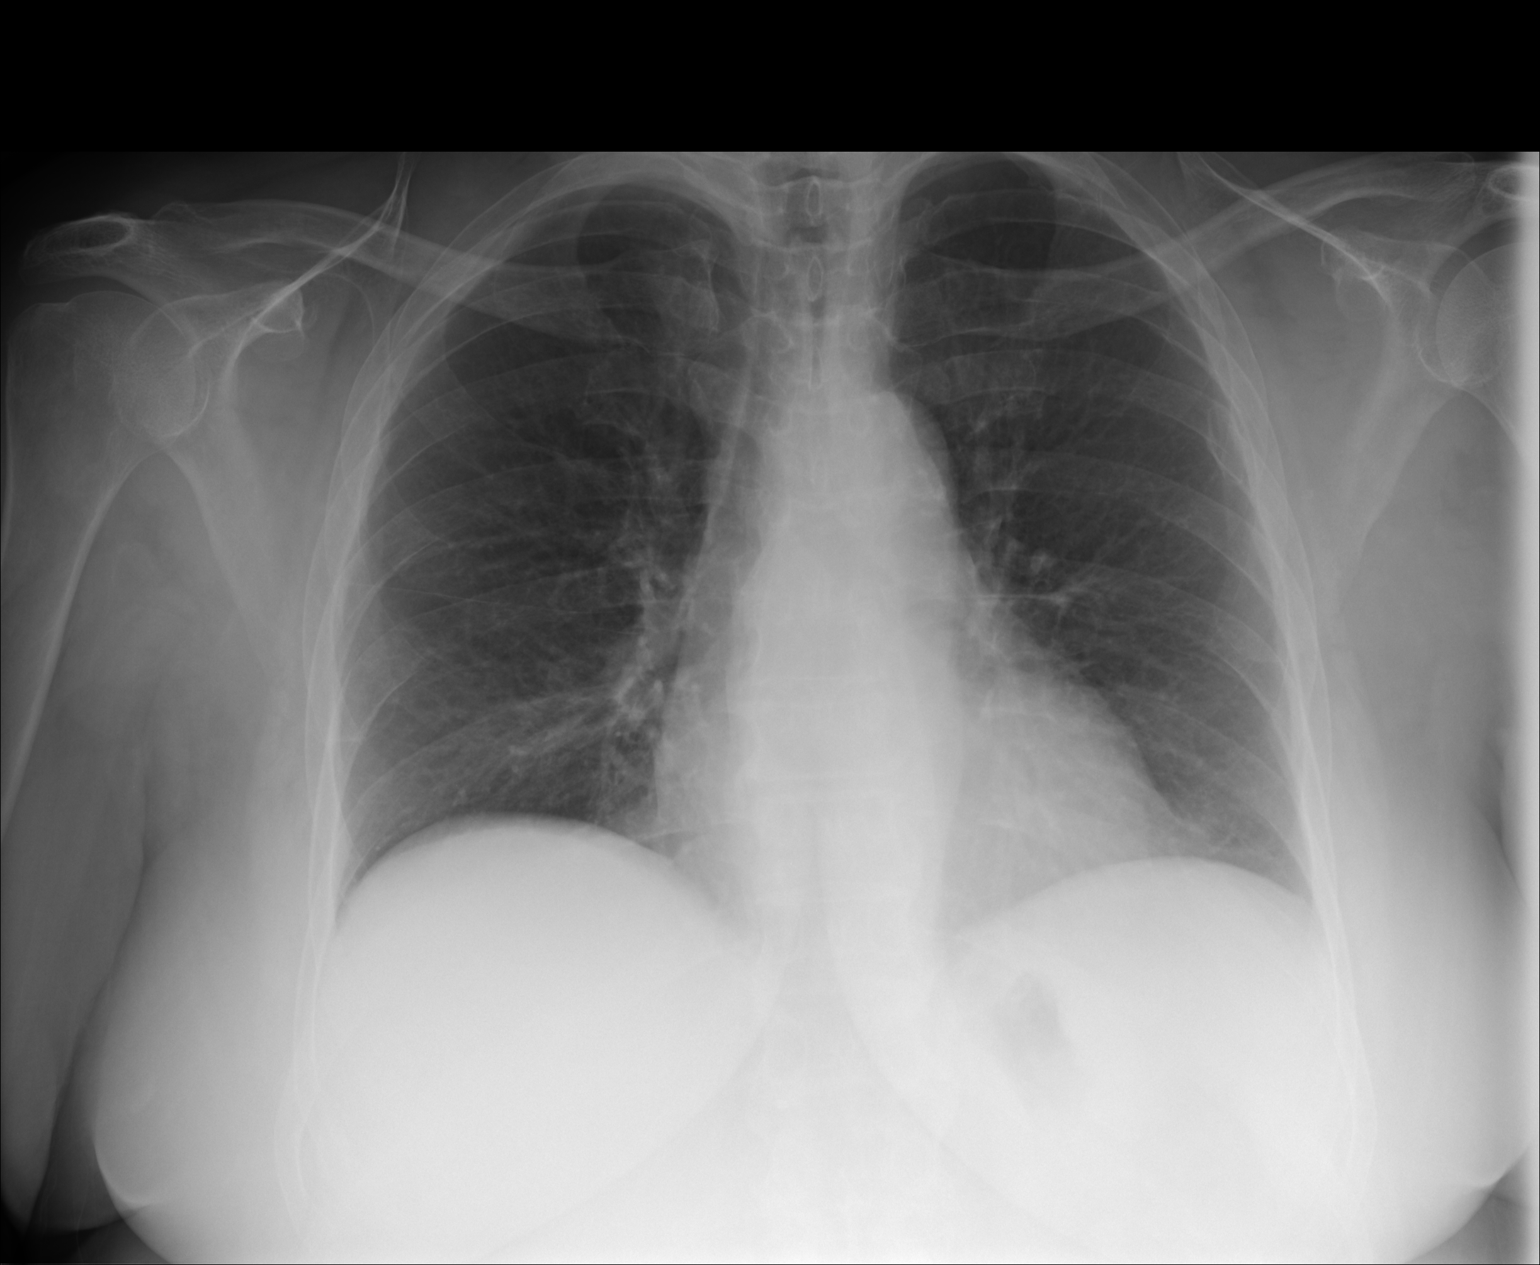
[im 2/2]
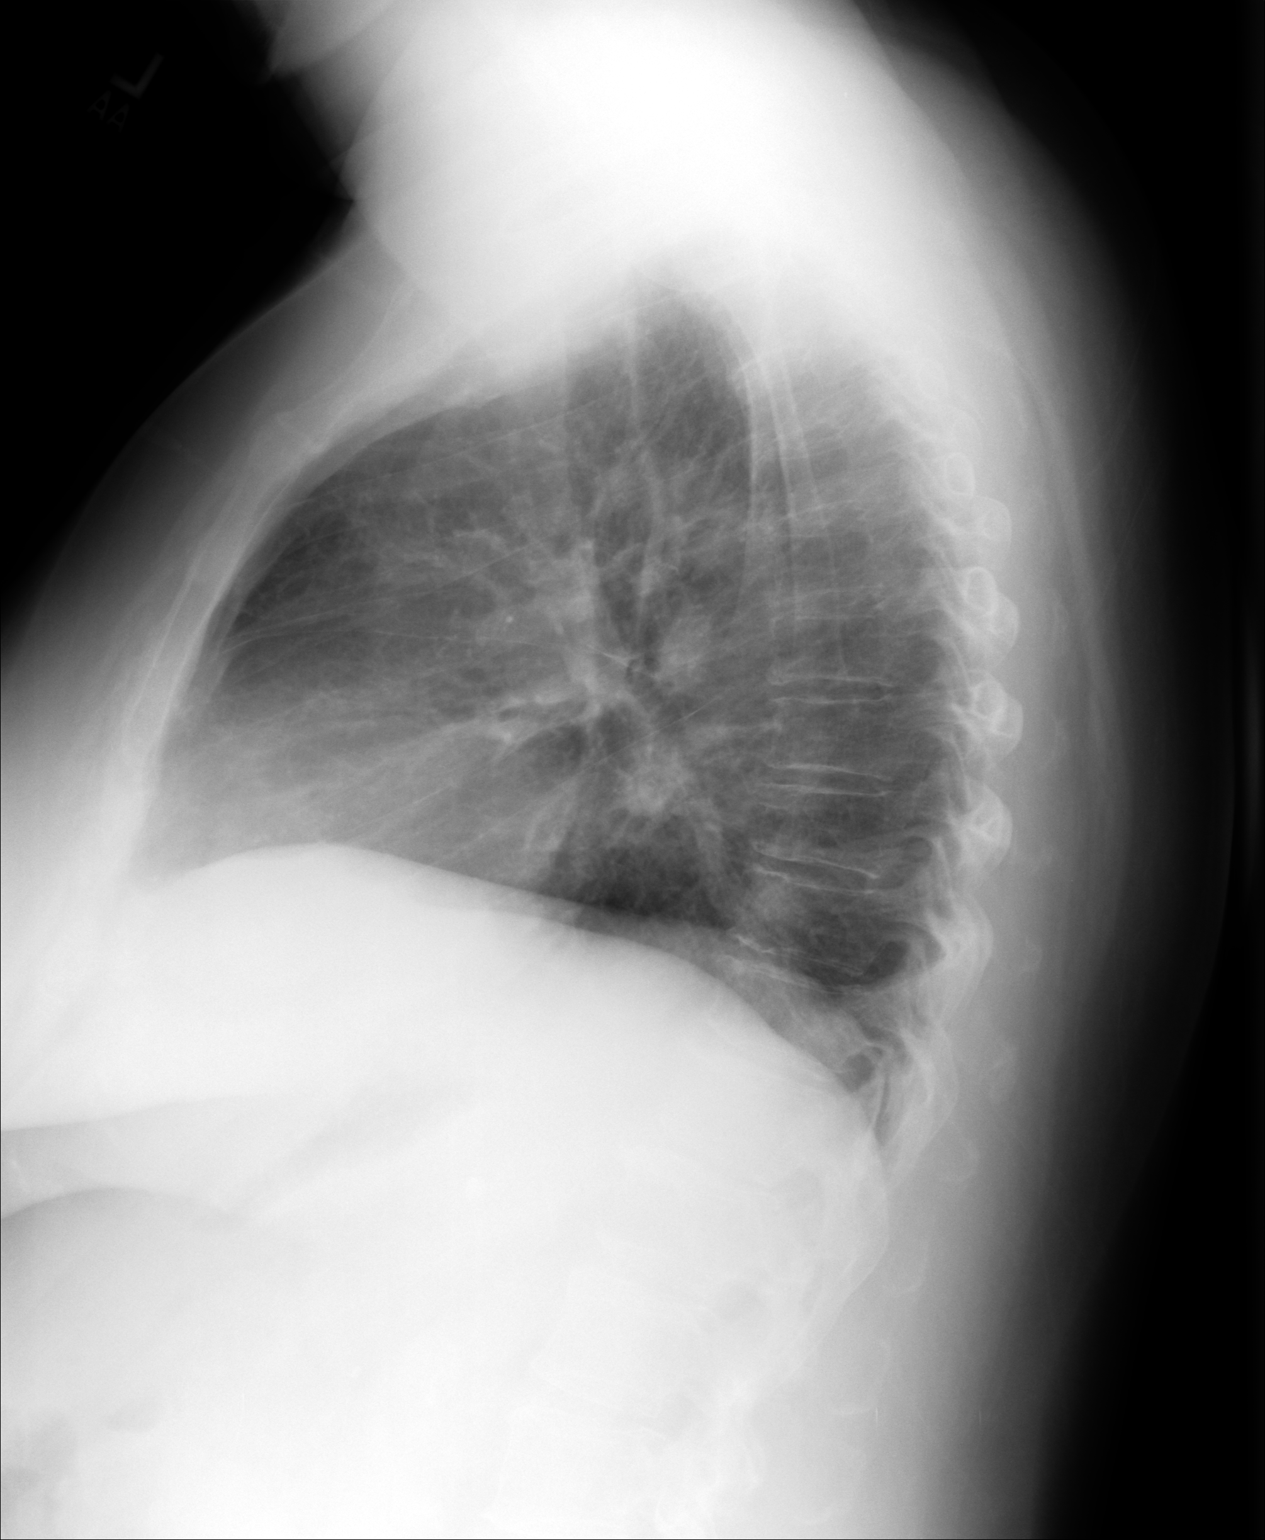

[2 of 2 positions shown; findings below may reference images not displayed]

PROCEDURE:     DXR - DXR CHEST PA (OR AP) AND LATERAL  - October 20, 2007  [DATE]

RESULT:     The patient has taken a shallow inspiration.  This study was
compared to a previous study dated 07/08/07.  Incidental note is made of a
healed rib fracture along the lateral aspect of the fifth rib on the LEFT.

The lungs are clear.  The cardiac silhouette is unremarkable.
IMPRESSION: No evidence of acute cardiopulmonary disease.

## 2010-05-24 ENCOUNTER — Emergency Department: Payer: Self-pay | Admitting: Emergency Medicine

## 2010-08-29 LAB — HM COLONOSCOPY: HM Colonoscopy: NORMAL

## 2010-09-05 ENCOUNTER — Ambulatory Visit: Payer: Self-pay | Admitting: Gastroenterology

## 2010-10-16 ENCOUNTER — Encounter: Payer: Self-pay | Admitting: Internal Medicine

## 2010-11-06 ENCOUNTER — Ambulatory Visit: Payer: Self-pay | Admitting: Internal Medicine

## 2010-11-10 ENCOUNTER — Encounter: Payer: Self-pay | Admitting: Internal Medicine

## 2010-12-11 ENCOUNTER — Encounter: Payer: Self-pay | Admitting: Internal Medicine

## 2010-12-11 DIAGNOSIS — I70269 Atherosclerosis of native arteries of extremities with gangrene, unspecified extremity: Secondary | ICD-10-CM

## 2010-12-11 HISTORY — PX: PTCA: SHX146

## 2010-12-11 HISTORY — DX: Atherosclerosis of native arteries of extremities with gangrene, unspecified extremity: I70.269

## 2011-01-06 ENCOUNTER — Ambulatory Visit: Payer: Self-pay | Admitting: Vascular Surgery

## 2011-01-11 ENCOUNTER — Encounter: Payer: Self-pay | Admitting: Internal Medicine

## 2011-01-11 HISTORY — PX: TOE AMPUTATION: SHX809

## 2011-01-17 ENCOUNTER — Ambulatory Visit: Payer: Self-pay | Admitting: Podiatry

## 2011-01-22 LAB — PATHOLOGY REPORT

## 2011-02-10 ENCOUNTER — Encounter: Payer: Self-pay | Admitting: Internal Medicine

## 2011-04-05 ENCOUNTER — Inpatient Hospital Stay: Payer: Self-pay | Admitting: Internal Medicine

## 2011-04-10 ENCOUNTER — Ambulatory Visit: Payer: Self-pay | Admitting: Internal Medicine

## 2011-05-17 ENCOUNTER — Inpatient Hospital Stay: Payer: Self-pay | Admitting: Specialist

## 2011-05-17 DIAGNOSIS — Z794 Long term (current) use of insulin: Secondary | ICD-10-CM | POA: Diagnosis not present

## 2011-05-17 DIAGNOSIS — Z79899 Other long term (current) drug therapy: Secondary | ICD-10-CM | POA: Diagnosis not present

## 2011-05-17 DIAGNOSIS — I209 Angina pectoris, unspecified: Secondary | ICD-10-CM | POA: Diagnosis not present

## 2011-05-17 DIAGNOSIS — Z90710 Acquired absence of both cervix and uterus: Secondary | ICD-10-CM | POA: Diagnosis not present

## 2011-05-17 DIAGNOSIS — Z9889 Other specified postprocedural states: Secondary | ICD-10-CM | POA: Diagnosis not present

## 2011-05-17 DIAGNOSIS — E1142 Type 2 diabetes mellitus with diabetic polyneuropathy: Secondary | ICD-10-CM | POA: Diagnosis present

## 2011-05-17 DIAGNOSIS — I509 Heart failure, unspecified: Secondary | ICD-10-CM | POA: Diagnosis present

## 2011-05-17 DIAGNOSIS — I5042 Chronic combined systolic (congestive) and diastolic (congestive) heart failure: Secondary | ICD-10-CM | POA: Diagnosis not present

## 2011-05-17 DIAGNOSIS — I10 Essential (primary) hypertension: Secondary | ICD-10-CM | POA: Diagnosis not present

## 2011-05-17 DIAGNOSIS — E1149 Type 2 diabetes mellitus with other diabetic neurological complication: Secondary | ICD-10-CM | POA: Diagnosis present

## 2011-05-17 DIAGNOSIS — I5040 Unspecified combined systolic (congestive) and diastolic (congestive) heart failure: Secondary | ICD-10-CM | POA: Diagnosis present

## 2011-05-17 DIAGNOSIS — I059 Rheumatic mitral valve disease, unspecified: Secondary | ICD-10-CM | POA: Diagnosis present

## 2011-05-17 DIAGNOSIS — I739 Peripheral vascular disease, unspecified: Secondary | ICD-10-CM | POA: Diagnosis present

## 2011-05-17 DIAGNOSIS — R079 Chest pain, unspecified: Secondary | ICD-10-CM | POA: Diagnosis not present

## 2011-05-17 DIAGNOSIS — M810 Age-related osteoporosis without current pathological fracture: Secondary | ICD-10-CM | POA: Diagnosis present

## 2011-05-17 DIAGNOSIS — Z8249 Family history of ischemic heart disease and other diseases of the circulatory system: Secondary | ICD-10-CM | POA: Diagnosis not present

## 2011-05-17 DIAGNOSIS — Z882 Allergy status to sulfonamides status: Secondary | ICD-10-CM | POA: Diagnosis not present

## 2011-05-17 DIAGNOSIS — Z7982 Long term (current) use of aspirin: Secondary | ICD-10-CM | POA: Diagnosis not present

## 2011-05-17 DIAGNOSIS — M129 Arthropathy, unspecified: Secondary | ICD-10-CM | POA: Diagnosis present

## 2011-05-17 DIAGNOSIS — I200 Unstable angina: Secondary | ICD-10-CM | POA: Diagnosis not present

## 2011-05-17 DIAGNOSIS — E669 Obesity, unspecified: Secondary | ICD-10-CM | POA: Diagnosis present

## 2011-05-17 DIAGNOSIS — I079 Rheumatic tricuspid valve disease, unspecified: Secondary | ICD-10-CM | POA: Diagnosis present

## 2011-05-17 DIAGNOSIS — I2510 Atherosclerotic heart disease of native coronary artery without angina pectoris: Secondary | ICD-10-CM | POA: Diagnosis not present

## 2011-05-17 DIAGNOSIS — Z809 Family history of malignant neoplasm, unspecified: Secondary | ICD-10-CM | POA: Diagnosis not present

## 2011-05-17 DIAGNOSIS — E119 Type 2 diabetes mellitus without complications: Secondary | ICD-10-CM | POA: Diagnosis not present

## 2011-05-17 LAB — CK TOTAL AND CKMB (NOT AT ARMC)
CK, Total: 209 U/L (ref 21–215)
CK, Total: 151 U/L (ref 21–215)
CK-MB: 3 ng/mL (ref 0.5–3.6)
CK-MB: 2.3 ng/mL (ref 0.5–3.6)

## 2011-05-17 LAB — CBC
HGB: 11.8 g/dL — ABNORMAL LOW (ref 12.0–16.0)
MCH: 30.1 pg (ref 26.0–34.0)
MCHC: 34 g/dL (ref 32.0–36.0)
MCV: 89 fL (ref 80–100)
RBC: 3.93 10*6/uL (ref 3.80–5.20)
RDW: 13.8 % (ref 11.5–14.5)

## 2011-05-17 LAB — BASIC METABOLIC PANEL
Anion Gap: 11 (ref 7–16)
BUN: 12 mg/dL (ref 7–18)
Co2: 26 mmol/L (ref 21–32)
Creatinine: 0.83 mg/dL (ref 0.60–1.30)
EGFR (African American): >60
Osmolality: 287 (ref 275–301)
Sodium: 141 mmol/L (ref 136–145)

## 2011-05-17 LAB — PRO B NATRIURETIC PEPTIDE: B-Type Natriuretic Peptide: 1278 pg/mL — ABNORMAL HIGH (ref 0–125)

## 2011-05-17 LAB — TROPONIN I
Troponin-I: <0.02 ng/mL
Troponin-I: 0.02 ng/mL

## 2011-05-18 LAB — CBC WITH DIFFERENTIAL/PLATELET
Basophil %: 0.4 %
Eosinophil #: 0.1 10*3/uL (ref 0.0–0.7)
Eosinophil %: 2.2 %
HCT: 33.4 % — ABNORMAL LOW (ref 35.0–47.0)
HGB: 11.1 g/dL — ABNORMAL LOW (ref 12.0–16.0)
Lymphocyte %: 23.5 %
MCHC: 33.3 g/dL (ref 32.0–36.0)
Monocyte #: 0.7 10*3/uL (ref 0.0–0.7)
Monocyte %: 10.7 %
Neutrophil #: 3.9 10*3/uL (ref 1.4–6.5)
Platelet: 253 10*3/uL (ref 150–440)
RBC: 3.73 10*6/uL — ABNORMAL LOW (ref 3.80–5.20)

## 2011-05-18 LAB — BASIC METABOLIC PANEL
Anion Gap: 8 (ref 7–16)
BUN: 15 mg/dL (ref 7–18)
Calcium, Total: 8.8 mg/dL (ref 8.5–10.1)
Chloride: 105 mmol/L (ref 98–107)
Creatinine: 1.04 mg/dL (ref 0.60–1.30)
Osmolality: 288 (ref 275–301)
Potassium: 4.3 mmol/L (ref 3.5–5.1)

## 2011-05-18 LAB — PROTIME-INR
INR: 1.1
Prothrombin Time: 14.5 secs (ref 11.5–14.7)

## 2011-05-18 LAB — TSH: Thyroid Stimulating Horm: 1.59 u[IU]/mL

## 2011-05-18 LAB — MAGNESIUM: Magnesium: 1.2 mg/dL — ABNORMAL LOW

## 2011-05-25 ENCOUNTER — Emergency Department: Payer: Self-pay | Admitting: *Deleted

## 2011-05-25 DIAGNOSIS — R109 Unspecified abdominal pain: Secondary | ICD-10-CM | POA: Diagnosis not present

## 2011-05-25 DIAGNOSIS — R1110 Vomiting, unspecified: Secondary | ICD-10-CM | POA: Diagnosis not present

## 2011-05-25 DIAGNOSIS — R1032 Left lower quadrant pain: Secondary | ICD-10-CM | POA: Diagnosis not present

## 2011-05-25 DIAGNOSIS — I2510 Atherosclerotic heart disease of native coronary artery without angina pectoris: Secondary | ICD-10-CM | POA: Diagnosis not present

## 2011-05-25 DIAGNOSIS — R197 Diarrhea, unspecified: Secondary | ICD-10-CM | POA: Diagnosis not present

## 2011-05-25 DIAGNOSIS — Z79899 Other long term (current) drug therapy: Secondary | ICD-10-CM | POA: Diagnosis not present

## 2011-05-25 DIAGNOSIS — I10 Essential (primary) hypertension: Secondary | ICD-10-CM | POA: Diagnosis not present

## 2011-05-25 DIAGNOSIS — E119 Type 2 diabetes mellitus without complications: Secondary | ICD-10-CM | POA: Diagnosis not present

## 2011-05-25 DIAGNOSIS — R51 Headache: Secondary | ICD-10-CM | POA: Diagnosis not present

## 2011-05-25 DIAGNOSIS — Z951 Presence of aortocoronary bypass graft: Secondary | ICD-10-CM | POA: Diagnosis not present

## 2011-05-25 DIAGNOSIS — E7800 Pure hypercholesterolemia, unspecified: Secondary | ICD-10-CM | POA: Diagnosis not present

## 2011-05-25 LAB — COMPREHENSIVE METABOLIC PANEL
Albumin: 3.4 g/dL (ref 3.4–5.0)
Alkaline Phosphatase: 50 U/L (ref 50–136)
Anion Gap: 12 (ref 7–16)
BUN: 14 mg/dL (ref 7–18)
Bilirubin,Total: 0.5 mg/dL (ref 0.2–1.0)
Creatinine: 0.98 mg/dL (ref 0.60–1.30)
Glucose: 165 mg/dL — ABNORMAL HIGH (ref 65–99)
Osmolality: 291 (ref 275–301)
Potassium: 4.6 mmol/L (ref 3.5–5.1)
Sodium: 144 mmol/L (ref 136–145)
Total Protein: 8.1 g/dL (ref 6.4–8.2)

## 2011-05-25 LAB — CBC
HCT: 39.1 % (ref 35.0–47.0)
HGB: 13 g/dL (ref 12.0–16.0)
MCH: 29.7 pg (ref 26.0–34.0)
MCHC: 33.3 g/dL (ref 32.0–36.0)
RDW: 14.1 % (ref 11.5–14.5)
WBC: 6 10*3/uL (ref 3.6–11.0)

## 2011-05-25 LAB — LIPASE, BLOOD: Lipase: 133 U/L (ref 73–393)

## 2011-05-26 DIAGNOSIS — R1032 Left lower quadrant pain: Secondary | ICD-10-CM | POA: Diagnosis not present

## 2011-05-26 LAB — URINALYSIS, COMPLETE
Bacteria: NONE SEEN
Bilirubin,UR: NEGATIVE
Glucose,UR: NEGATIVE mg/dL (ref 0–75)
Ketone: NEGATIVE
RBC,UR: 1 /HPF (ref 0–5)
Squamous Epithelial: 1
WBC UR: 2 /HPF (ref 0–5)

## 2011-05-27 DIAGNOSIS — I2510 Atherosclerotic heart disease of native coronary artery without angina pectoris: Secondary | ICD-10-CM | POA: Diagnosis not present

## 2011-05-29 DIAGNOSIS — E1142 Type 2 diabetes mellitus with diabetic polyneuropathy: Secondary | ICD-10-CM | POA: Diagnosis not present

## 2011-05-29 DIAGNOSIS — B351 Tinea unguium: Secondary | ICD-10-CM | POA: Diagnosis not present

## 2011-05-29 DIAGNOSIS — Q828 Other specified congenital malformations of skin: Secondary | ICD-10-CM | POA: Diagnosis not present

## 2011-05-29 DIAGNOSIS — E1149 Type 2 diabetes mellitus with other diabetic neurological complication: Secondary | ICD-10-CM | POA: Diagnosis not present

## 2011-05-29 DIAGNOSIS — R112 Nausea with vomiting, unspecified: Secondary | ICD-10-CM | POA: Diagnosis not present

## 2011-05-29 DIAGNOSIS — R109 Unspecified abdominal pain: Secondary | ICD-10-CM | POA: Diagnosis not present

## 2011-06-18 DIAGNOSIS — E11319 Type 2 diabetes mellitus with unspecified diabetic retinopathy without macular edema: Secondary | ICD-10-CM | POA: Diagnosis not present

## 2011-07-29 DIAGNOSIS — E119 Type 2 diabetes mellitus without complications: Secondary | ICD-10-CM | POA: Diagnosis not present

## 2011-07-29 DIAGNOSIS — E559 Vitamin D deficiency, unspecified: Secondary | ICD-10-CM | POA: Diagnosis not present

## 2011-07-29 DIAGNOSIS — M810 Age-related osteoporosis without current pathological fracture: Secondary | ICD-10-CM | POA: Diagnosis not present

## 2011-07-29 DIAGNOSIS — Z0000 Encounter for general adult medical examination without abnormal findings: Secondary | ICD-10-CM | POA: Diagnosis not present

## 2011-07-29 DIAGNOSIS — I10 Essential (primary) hypertension: Secondary | ICD-10-CM | POA: Diagnosis not present

## 2011-07-29 DIAGNOSIS — E7800 Pure hypercholesterolemia, unspecified: Secondary | ICD-10-CM | POA: Diagnosis not present

## 2011-08-05 DIAGNOSIS — R29890 Loss of height: Secondary | ICD-10-CM | POA: Diagnosis not present

## 2011-08-05 DIAGNOSIS — Z780 Asymptomatic menopausal state: Secondary | ICD-10-CM | POA: Diagnosis not present

## 2011-08-13 DIAGNOSIS — E119 Type 2 diabetes mellitus without complications: Secondary | ICD-10-CM | POA: Diagnosis not present

## 2011-08-20 DIAGNOSIS — I70229 Atherosclerosis of native arteries of extremities with rest pain, unspecified extremity: Secondary | ICD-10-CM | POA: Diagnosis not present

## 2011-08-20 DIAGNOSIS — E119 Type 2 diabetes mellitus without complications: Secondary | ICD-10-CM | POA: Diagnosis not present

## 2011-08-21 DIAGNOSIS — E1159 Type 2 diabetes mellitus with other circulatory complications: Secondary | ICD-10-CM | POA: Diagnosis not present

## 2011-08-21 DIAGNOSIS — I798 Other disorders of arteries, arterioles and capillaries in diseases classified elsewhere: Secondary | ICD-10-CM | POA: Diagnosis not present

## 2011-08-21 DIAGNOSIS — B351 Tinea unguium: Secondary | ICD-10-CM | POA: Diagnosis not present

## 2011-08-21 DIAGNOSIS — Q828 Other specified congenital malformations of skin: Secondary | ICD-10-CM | POA: Diagnosis not present

## 2011-09-08 DIAGNOSIS — H1130 Conjunctival hemorrhage, unspecified eye: Secondary | ICD-10-CM | POA: Diagnosis not present

## 2011-09-12 DIAGNOSIS — E1159 Type 2 diabetes mellitus with other circulatory complications: Secondary | ICD-10-CM | POA: Diagnosis not present

## 2011-09-12 DIAGNOSIS — I798 Other disorders of arteries, arterioles and capillaries in diseases classified elsewhere: Secondary | ICD-10-CM | POA: Diagnosis not present

## 2011-09-12 DIAGNOSIS — Q828 Other specified congenital malformations of skin: Secondary | ICD-10-CM | POA: Diagnosis not present

## 2011-09-26 DIAGNOSIS — M7989 Other specified soft tissue disorders: Secondary | ICD-10-CM | POA: Diagnosis not present

## 2011-09-26 DIAGNOSIS — M79609 Pain in unspecified limb: Secondary | ICD-10-CM | POA: Diagnosis not present

## 2011-10-08 DIAGNOSIS — I739 Peripheral vascular disease, unspecified: Secondary | ICD-10-CM | POA: Diagnosis not present

## 2011-10-08 DIAGNOSIS — L97509 Non-pressure chronic ulcer of other part of unspecified foot with unspecified severity: Secondary | ICD-10-CM | POA: Diagnosis not present

## 2011-10-23 DIAGNOSIS — Q828 Other specified congenital malformations of skin: Secondary | ICD-10-CM | POA: Diagnosis not present

## 2011-10-23 DIAGNOSIS — E1159 Type 2 diabetes mellitus with other circulatory complications: Secondary | ICD-10-CM | POA: Diagnosis not present

## 2011-10-23 DIAGNOSIS — L97909 Non-pressure chronic ulcer of unspecified part of unspecified lower leg with unspecified severity: Secondary | ICD-10-CM | POA: Diagnosis not present

## 2011-10-23 DIAGNOSIS — B351 Tinea unguium: Secondary | ICD-10-CM | POA: Diagnosis not present

## 2011-10-23 DIAGNOSIS — I798 Other disorders of arteries, arterioles and capillaries in diseases classified elsewhere: Secondary | ICD-10-CM | POA: Diagnosis not present

## 2011-10-29 DIAGNOSIS — E119 Type 2 diabetes mellitus without complications: Secondary | ICD-10-CM | POA: Diagnosis not present

## 2011-10-29 DIAGNOSIS — L97509 Non-pressure chronic ulcer of other part of unspecified foot with unspecified severity: Secondary | ICD-10-CM | POA: Diagnosis not present

## 2011-11-10 ENCOUNTER — Ambulatory Visit: Payer: Medicare Other | Admitting: Internal Medicine

## 2011-12-05 DIAGNOSIS — R0989 Other specified symptoms and signs involving the circulatory and respiratory systems: Secondary | ICD-10-CM | POA: Diagnosis not present

## 2011-12-05 DIAGNOSIS — I209 Angina pectoris, unspecified: Secondary | ICD-10-CM | POA: Diagnosis not present

## 2011-12-12 ENCOUNTER — Encounter: Payer: Self-pay | Admitting: Internal Medicine

## 2011-12-12 ENCOUNTER — Ambulatory Visit (INDEPENDENT_AMBULATORY_CARE_PROVIDER_SITE_OTHER): Payer: Medicare Other | Admitting: Internal Medicine

## 2011-12-12 VITALS — BP 134/60 | HR 75 | Temp 97.9°F | Resp 16 | Ht 66.0 in | Wt 182.5 lb

## 2011-12-12 DIAGNOSIS — Z1239 Encounter for other screening for malignant neoplasm of breast: Secondary | ICD-10-CM

## 2011-12-12 DIAGNOSIS — Z8679 Personal history of other diseases of the circulatory system: Secondary | ICD-10-CM

## 2011-12-12 DIAGNOSIS — E1149 Type 2 diabetes mellitus with other diabetic neurological complication: Secondary | ICD-10-CM

## 2011-12-12 DIAGNOSIS — E1140 Type 2 diabetes mellitus with diabetic neuropathy, unspecified: Secondary | ICD-10-CM

## 2011-12-12 DIAGNOSIS — I2510 Atherosclerotic heart disease of native coronary artery without angina pectoris: Secondary | ICD-10-CM | POA: Diagnosis not present

## 2011-12-12 DIAGNOSIS — E1142 Type 2 diabetes mellitus with diabetic polyneuropathy: Secondary | ICD-10-CM

## 2011-12-12 DIAGNOSIS — E1165 Type 2 diabetes mellitus with hyperglycemia: Secondary | ICD-10-CM

## 2011-12-12 NOTE — Progress Notes (Signed)
Patient ID: Pamela Collier, female   DOB: [birth year], 71 y.o.   MRN: [id number]  Patient Active Problem List  Diagnosis  . Diabetes mellitus type 2, uncontrolled  . Double vessel coronary artery disease  . H/O peripheral vascular disease  . Diabetic neuropathy, type II diabetes mellitus    Subjective:  CC:   Chief Complaint  Patient presents with  . New Patient    HPI:   Pamela Collier is a 71 y.o. female who presents as a new patient to establish primary care with the chief complaint of  Diabetes mellitus ,  Uncontrolled, diagnosed 30 yrs ago.  Last hgba1c was  "too high". 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  2) PAD, managed by Dew. Has neuropathy for years managed suboptimally with Lyrica but it does helps her rest better.  Her last eye exam was 2012  She has a history of cataracts  of both eyes, with removal by an ophthalmologist in [place] that she was referred to by her local eye doctor Marvel Plan in [place].    she has had prior diabetes education classes but feels she could use her pressure course if covered by her insurance..  her attempts to exercise  have been limited by the recent episode of gangrene involving her great toe which required amputation . 3)  Her diabetes is complicated by heart disease,  History of stentsx 2 prior to her relocation from [place] to [place].  Currently her heart disease is  managed by [facility]. Recent LHC last year unchanged per patient. [name] 4) night sweats drenching her gown at night, no unintentional wt loss , no post prandial flashes or dumping syndrome.Her last mammogram August 2013.     Past Medical History  Diagnosis Date  . Diabetes mellitus     Past Surgical History  Procedure Date  . Abdominal hysterectomy     at ge 39. secondary to bleeding    Family History  Problem Relation Age of Onset  . Heart disease Father   . Heart disease Sister   . Heart disease Brother     History   Social  History  . Marital Status: Married    Spouse Name: N/A    Number of Children: N/A  . Years of Education: N/A   Occupational History  . Not on file.   Social History Main Topics  . Smoking status: Never Smoker   . Smokeless tobacco: Never Used  . Alcohol Use: No  . Drug Use: No  . Sexually Active: Not on file   Other Topics Concern  . Not on file   Social History Narrative  . No narrative on file   Allergies  Allergen Reactions  . Sulfa Antibiotics Itching    Review of Systems:  positive for bilateral pain involving both legs. The remainder of the review of systems was negative except those addressed in the HPI.       Objective:  BP 134/60  Pulse 75  Temp 97.9 F (36.6 C) (Oral)  Resp 16  Ht 5\' 6"  (1.676 m)  Wt 182 lb 8 oz (82.781 kg)  BMI 29.46 kg/m2  SpO2 97%  General appearance: alert, cooperative and appears stated age Ears: normal TM's and external ear canals both ears Throat: lips, mucosa, and tongue normal; teeth and gums normal Neck: no adenopathy, no carotid bruit, supple, symmetrical, trachea midline and thyroid not enlarged, symmetric, no tenderness/mass/nodules Back: symmetric, no curvature. ROM normal. No CVA tenderness. Lungs: clear to  auscultation bilaterally Heart: regular rate and rhythm, S1, S2 normal, no murmur, click, rub or gallop Abdomen: soft, non-tender; bowel sounds normal; no masses,  no organomegaly Pulses: palpable bilaterally in DP Skin: Skin color, texture, turgor normal. No rashes or lesions Lymph nodes: Cervical, supraclavicular, and axillary nodes normal.  Assessment and Plan:  Diabetes mellitus type 2, uncontrolled With neuropathy, micro-and macrovascular complications by history. She has not had a hemoglobin A1c in several months. She did not bring a log of her blood sugars with her. Prior to making any changes to her regimen I will ask her to have repeat A1c and bring a log of her blood sugars with her so that we can  make safe adjustments in insulin regimen. She will also need fasting lipids,  urine microalbumin to creatinine creatinine ratio, and I will need to sit review her prior records.   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 Managed locally by Dr. dew. Records requested.  Double vessel coronary artery disease  With prior PTCA/stent x2 done prior to move to [facility]. Her crit requested from Dr. Towanda Malkin. She's on the appropriate medications. We'll have her return for fasting lipid panels with a goal LDL of 70.   Updated Medication List Outpatient Encounter Prescriptions as of 12/12/2011  Medication Sig Dispense Refill  . amLODipine (NORVASC) 10 MG tablet Take 10 mg by mouth daily.      . enalapril (VASOTEC) 20 MG tablet Take 20 mg by mouth daily.      [name] ibandronate (BONIVA) 150 MG tablet Take 150 mg by mouth every 30 (thirty) days. Take in the morning with a full glass of water, on an empty stomach, and do not take anything else by mouth or lie down for the next 30 min.      . insulin glargine (LANTUS) 100 UNIT/ML injection Inject 20 Units into the skin every morning. 35 units at bedtime.      . metFORMIN (GLUCOPHAGE) 1000 MG tablet Take 1,000 mg by mouth 2 (two) times daily with a meal.      . metoprolol tartrate (LOPRESSOR) 25 MG tablet Take 25 mg by mouth 2 (two) times daily.      . pregabalin (LYRICA) 100 MG capsule Take 100 mg by mouth 3 (three) times daily.      . rosuvastatin (CRESTOR) 10 MG tablet Take 10 mg by mouth daily.         Orders Placed This Encounter  Procedures  . MM Digital Screening  . HM MAMMOGRAPHY  . HM PAP SMEAR  . HM DIABETES FOOT EXAM  . HM COLONOSCOPY    Return in about 1 month for annual medicare exam and diabetes follow up.

## 2011-12-12 NOTE — Patient Instructions (Addendum)
Please record your blood sugars  Twice daily, at the following optional times (you can vary each day )  1) fasting  2) 2 hrs after any meal  3) just before lunch or dinner   4) at bedtime   Read about the Low Glycemic Index Diet and eating 6 smaller meals daily .  This frequent feeding stimulates your metabolism and the lower glycemic index foods will lower your blood sugars:   This is an example of my daily  "Low GI"  Diet:  All of the foods can be found at grocery stores and in bulk at [employer]  club   7 AM Breakfast:  Low carbohydrate Protein  Shakes (I recommend the [employer]vant[employer] "Carb Control" shakes  Or the low carb shakes by Atkins.   Both are available everywhere:  In  cases at [employer]  Or in 4 packs at grocery stores and pharmacies  2.5 carbs  (Alternative is  a toasted Arnold's Sandwhich Thin w/ peanut butter, a "Bagel Thin" with cream cheese and salmon) or  a scrambled egg burrito made with a low carb tortilla .  Avoid cereal and bananas, oatmeal too unless the old fashioned kind that takes 30-40 minutes to prepare.  the rest is overly processed, has minimal fiber, and loaded with carbohydrates!  Avoid "smoothies" with bananas, pineapple bc they are very high in sugar.  Use berries,  All kinds  And greek yogurt as the base    10 AM: Protein bar by Atkins (the snack size, under 200 cal.  There are many varieties , available widely again or in bulk in limited varieties at [employer])  Other so called "protein bars" tend to be loaded with carbohydrates.  Remember, in food advertising, the word "energy" is synonymous for " carbohydrate."  Lunch: sandwich of [place], (or any lunchmeat or canned tuna), fresh avocado and cheese on a lower carbohydrate pita bread, flatbread, or tortilla . Ok to use mayonnaise. The bread is the only source or carbohydrate that can be decreased (Joseph's makes a pita bread and a flat bread  Are 50 cal and 4 net carbs ; Toufayan makes a low carb flatbread 100 cal and 9 net carbs  and   Mission makes a low carb whole wheat tortilla  210 cal and 6 net carbs) Avoid pretzels,  Crackers and potate chips because they are all carbohydrates .  3 PM:  Mid day :  Another proteintttt bThe brear,  Or a  cheese stick (100 cal, 0 carbs),  Or 1 ounce of  almonds, walnuts, pistachios, pecans, peanuts,  Macadamia nuts. Or a Dannon light n Fit greek yogurt, 80 cal 8 net carbs . Avoid "granola"; the dried cranberries and raisins are loaded with carbohydrates.    6 PM  Dinner:  "mean and green:"  Choose a grilled or baked Meat/chicken/fish or a high protein legume; , with a green salad, and a low GI  Veggie (broccoli, cauliflower, green beans, spinach, brussel sprouts. Lima beans) : Avoid "Low fat dressings, Barry Brunner and [place]! They are loaded with sugar! Instead use ranch, vinagrette,  Blue cheese, etc.  Be careful with corn and sugar snaps,  They are high in sugars.  Broccoli, cauliflower, collard greens, cabbage are all fine.    9 PM snack : Breyer's "low carb" fudgsicle or  ice cream bar (Carb Smart line), or  Weight Watcher's ice cream bar , or anouther "no sugar added" ice cream; or another protein shake or a serving of  fresh fruit with whipped cream (Avoid bananas, pineapple, grapes  and watermelon on a regular basis because they are high in sugar)   Remember that snack Substitutions should be less than 15 to 20 carbs  Per serving. Remember to subtract fiber grams to get the "net carbs."

## 2011-12-14 ENCOUNTER — Encounter: Payer: Self-pay | Admitting: Internal Medicine

## 2011-12-14 DIAGNOSIS — Z8679 Personal history of other diseases of the circulatory system: Secondary | ICD-10-CM | POA: Insufficient documentation

## 2011-12-14 DIAGNOSIS — I2510 Atherosclerotic heart disease of native coronary artery without angina pectoris: Secondary | ICD-10-CM | POA: Insufficient documentation

## 2011-12-14 DIAGNOSIS — IMO0002 Reserved for concepts with insufficient information to code with codable children: Secondary | ICD-10-CM

## 2011-12-14 DIAGNOSIS — E1129 Type 2 diabetes mellitus with other diabetic kidney complication: Secondary | ICD-10-CM

## 2011-12-14 DIAGNOSIS — E1140 Type 2 diabetes mellitus with diabetic neuropathy, unspecified: Secondary | ICD-10-CM | POA: Insufficient documentation

## 2011-12-14 HISTORY — DX: Type 2 diabetes mellitus with other diabetic kidney complication: E11.29

## 2011-12-14 HISTORY — DX: Reserved for concepts with insufficient information to code with codable children: IMO0002

## 2011-12-14 NOTE — Assessment & Plan Note (Signed)
With neuropathy, micro-and macrovascular complications by history. She has not had a hemoglobin A1c in several months. She did not bring a log of her blood sugars with her. Prior to making any changes to her regimen I will ask her to have repeat A1c and bring a log of her blood sugars with her so that we can make safe adjustments in insulin regimen. She will also need fasting lipids,  urine microalbumin to creatinine creatinine ratio, and I will need to sit review her prior records.

## 2011-12-14 NOTE — Assessment & Plan Note (Signed)
With prior PTCA/stent x2 done prior to move to [place]. Her crit requested from Dr. Towanda Malkin. She's on the appropriate medications. We'll have her return for fasting lipid panels with a goal LDL of 70.

## 2011-12-14 NOTE — Assessment & Plan Note (Signed)
unclear how much of recurrent pain is neuropathy versus

## 2011-12-14 NOTE — Assessment & Plan Note (Signed)
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 Managed locally by Dr. dew. Records requested.

## 2011-12-15 ENCOUNTER — Other Ambulatory Visit: Payer: Self-pay | Admitting: Internal Medicine

## 2011-12-15 MED ORDER — ENALAPRIL MALEATE 20 MG PO TABS: 20.0000 mg | ORAL_TABLET | Freq: Every day | ORAL | Status: DC

## 2011-12-15 MED ORDER — INSULIN GLARGINE 100 UNIT/ML ~~LOC~~ SOLN: 20.0000 [IU] | SUBCUTANEOUS | Status: DC

## 2011-12-15 MED ORDER — METFORMIN HCL 1000 MG PO TABS: 1000.0000 mg | ORAL_TABLET | Freq: Two times a day (BID) | ORAL | Status: DC

## 2011-12-15 MED ORDER — ROSUVASTATIN CALCIUM 10 MG PO TABS: 10.0000 mg | ORAL_TABLET | Freq: Every day | ORAL | Status: DC

## 2011-12-15 MED ORDER — PREGABALIN 100 MG PO CAPS: 100.0000 mg | ORAL_CAPSULE | Freq: Three times a day (TID) | ORAL | Status: DC

## 2011-12-15 MED ORDER — IBANDRONATE SODIUM 150 MG PO TABS: 150.0000 mg | ORAL_TABLET | ORAL | Status: DC

## 2011-12-15 MED ORDER — METOPROLOL TARTRATE 25 MG PO TABS: 25.0000 mg | ORAL_TABLET | Freq: Two times a day (BID) | ORAL | Status: DC

## 2011-12-15 MED ORDER — AMLODIPINE BESYLATE 10 MG PO TABS: 10.0000 mg | ORAL_TABLET | Freq: Every day | ORAL | Status: DC

## 2011-12-19 ENCOUNTER — Ambulatory Visit: Payer: Medicare Other | Admitting: Internal Medicine

## 2011-12-24 ENCOUNTER — Ambulatory Visit: Payer: Self-pay | Admitting: Internal Medicine

## 2011-12-24 DIAGNOSIS — Z1231 Encounter for screening mammogram for malignant neoplasm of breast: Secondary | ICD-10-CM | POA: Diagnosis not present

## 2011-12-31 ENCOUNTER — Encounter: Payer: Self-pay | Admitting: Internal Medicine

## 2012-01-16 DIAGNOSIS — I739 Peripheral vascular disease, unspecified: Secondary | ICD-10-CM | POA: Diagnosis not present

## 2012-01-16 DIAGNOSIS — S98139A Complete traumatic amputation of one unspecified lesser toe, initial encounter: Secondary | ICD-10-CM | POA: Diagnosis not present

## 2012-01-16 DIAGNOSIS — B351 Tinea unguium: Secondary | ICD-10-CM | POA: Diagnosis not present

## 2012-01-16 DIAGNOSIS — L97909 Non-pressure chronic ulcer of unspecified part of unspecified lower leg with unspecified severity: Secondary | ICD-10-CM | POA: Diagnosis not present

## 2012-01-16 DIAGNOSIS — Q828 Other specified congenital malformations of skin: Secondary | ICD-10-CM | POA: Diagnosis not present

## 2012-01-19 ENCOUNTER — Ambulatory Visit (INDEPENDENT_AMBULATORY_CARE_PROVIDER_SITE_OTHER): Payer: Medicare Other | Admitting: Internal Medicine

## 2012-01-19 ENCOUNTER — Encounter: Payer: Self-pay | Admitting: Internal Medicine

## 2012-01-19 VITALS — BP 144/60 | HR 70 | Temp 98.3°F | Resp 16 | Wt 179.2 lb

## 2012-01-19 DIAGNOSIS — E119 Type 2 diabetes mellitus without complications: Secondary | ICD-10-CM

## 2012-01-19 LAB — COMPREHENSIVE METABOLIC PANEL
Albumin: 3.6 g/dL (ref 3.5–5.2)
CO2: 26 mEq/L (ref 19–32)
Chloride: 105 mEq/L (ref 96–112)
GFR: 69.35 mL/min (ref >60.00)
Glucose, Bld: 199 mg/dL — ABNORMAL HIGH (ref 70–99)
Potassium: 4.9 mEq/L (ref 3.5–5.1)
Sodium: 138 mEq/L (ref 135–145)
Total Protein: 7.6 g/dL (ref 6.0–8.3)

## 2012-01-19 MED ORDER — IBANDRONATE SODIUM 150 MG PO TABS: 150.0000 mg | ORAL_TABLET | ORAL | Status: DC

## 2012-01-19 NOTE — Progress Notes (Signed)
Patient ID: Pamela Collier, female   DOB: 07-30-1940, 71 y.o.   MRN: [id number]    Subjective:     Pamela Collier is a 71 y.o. female who presents for follow up of diabetes.. Current symptoms include: none. Patient denies foot ulcerations, increased appetite and paresthesia of the feet. Evaluation to date has been: not done. Home sugars: BGs range between 100 and 130 in the morning fasting., BGs are high around dinner. Current treatments: more intensive attention to diet which has been somewhat effective. Last dilated eye exam 2013  The following portions of the patient's history were reviewed and updated as appropriate: allergies, current medications, past family history, past medical history, past social history, past surgical history and problem list.  Review of Systems A comprehensive review of systems was negative.    Objective:    BP 144/60  Pulse 70  Temp 98.3 F (36.8 C) (Oral)  Resp 16  Wt 179 lb 4 oz (81.307 kg)  SpO2 97% General appearance: alert, cooperative and appears stated age Throat: lips, mucosa, and tongue normal; teeth and gums normal Neck: no adenopathy, no carotid bruit, no JVD, supple, symmetrical, trachea midline and thyroid not enlarged, symmetric, no tenderness/mass/nodules Lungs: clear to auscultation bilaterally Heart: regular rate and rhythm, S1, S2 normal, no murmur, click, rub or gallop Abdomen: soft, non-tender; bowel sounds normal; no masses,  no organomegaly Extremities: extremities normal, atraumatic, no cyanosis or edema Pulses: 2+ and symmetric    Patient was evaluated for proper footwear and sizing.  Laboratory: No components found with this basename: A1C      Assessment:    Diabetes mellitus Type II, under fair control.    Plan:    Labs: fasting blood sugar, fasting lipid panel, hemoglobin A1C and microalbuminuria.

## 2012-01-19 NOTE — Patient Instructions (Addendum)
Reduce your metformin dose to 1/2 tablet twice daily .    Increase your Lantus to 30 units in the morning and  25 units  At night  Today we checked your hgba1c and your liver/kidney function    This is  Dr. Lupita Dawn version of a  "Low GI"  Diet:  All of the foods can be found at grocery stores and in bulk at [employer].  The Atkins protein bars and shakes are available in more varieties at [employer], [employer] and [place].  Remember that snack Substitutions should be less than 15 to 20 carbs per serving. Remember to subtract fiber grams and sugar alcohols to get the "net carbs."    7 AM Breakfast:  Low carbohydrate Protein  Shakes (I recommend the EAS AdvantEdge "Carb Control" shakes  Or the low carb shakes by Atkins.   Both are available everywhere:  In  cases at [employer]  Or in 4 packs at grocery stores and pharmacies  2.5 carbs  (Alternative is  a toasted Arnold's Sandwhich Thin w/ peanut butter, a "Bagel Thin" with cream cheese and salmon) or  a scrambled egg burrito made with a low carb tortilla .  Avoid cereal and bananas, oatmeal too unless you are cooking the old fashioned kind that takes 30-40 minutes to prepare.  the rest is overly processed, has minimal fiber, and is loaded with carbohydrates!   10 AM: Protein bar by Atkins (the snack size, under 200 cal).  There are many varieties , available widely again or in bulk in limited varieties at [employer])  Other so called "protein bars" tend to be loaded with carbohydrates.  Remember, in food advertising, the word "energy" is synonymous for " carbohydrate."  Lunch: sandwich of [place], (or any lunchmeat, grilled meat or canned tuna), fresh avocado, mayonnaise  and cheese on a lower carbohydrate pita bread, flatbread, or tortilla . Ok to use regular mayonnaise. The bread is the only source or carbohydrate that can be decreased (Joseph's makes a pita bread and a flat bread that are 50 cal and 4 net carbs ; Toufayan makes a low carb flatbread that's 100 cal and  9 net carbs  and  M[place] makes a low carb whole wheat tortilla  That is 210 cal and 6 net carbs)  3 PM:  Mid day :  Another protein bar,  Or a  cheese stick (100 cal, 0 carbs),  Or 1 ounce of  almonds, walnuts, pistachios, pecans, peanuts,  Macadamia nuts. Or a Dannon light n Fit greek yogurt, 80 cal 8 net carbs . Avoid "granola"; the dried cranberries and raisins are loaded with carbohydrates. Mixed nuts ok if no raisins or cranberries or dried fruit.      6 PM  Dinner:  "mean and green:"  Meat/chicken/fish or a high protein legume; , with a green salad, and a low GI  Veggie (broccoli, cauliflower, green beans, spinach, brussel sprouts. Lima beans) : Avoid "Low fat dressings, as well as [place]! They are loaded with sugar! Instead use ranch, vinagrette,  Blue cheese, etc  9 PM snack : Breyer's "low carb" fudgsicle or  ice cream bar (Carb Smart line), or  Weight Watcher's ice cream bar , or another "no sugar added" ice cream;a serving of fresh berries/cherries with whipped cream (Avoid bananas, pineapple, grapes  and watermelon on a regular basis because they are high in sugar)

## 2012-01-20 LAB — MICROALBUMIN / CREATININE URINE RATIO: Microalb, Ur: 162.4 mg/dL — ABNORMAL HIGH (ref 0.0–1.9)

## 2012-01-29 DIAGNOSIS — I739 Peripheral vascular disease, unspecified: Secondary | ICD-10-CM | POA: Diagnosis not present

## 2012-01-29 DIAGNOSIS — L97909 Non-pressure chronic ulcer of unspecified part of unspecified lower leg with unspecified severity: Secondary | ICD-10-CM | POA: Diagnosis not present

## 2012-02-01 ENCOUNTER — Telehealth: Payer: Self-pay | Admitting: Internal Medicine

## 2012-02-01 NOTE — Telephone Encounter (Signed)
Pamela Collier's morning sugars are fine,  Her pre dinners sugars are elevated (170 to 200, should be < 150) .  Please ask her to check sugars before and 2 hours after her dinner for the next week,  She can omit the morning sugar check for right now.

## 2012-02-02 NOTE — Telephone Encounter (Signed)
Patient notified. She will call with results in 1 week.

## 2012-02-12 DIAGNOSIS — L97909 Non-pressure chronic ulcer of unspecified part of unspecified lower leg with unspecified severity: Secondary | ICD-10-CM | POA: Diagnosis not present

## 2012-02-17 DIAGNOSIS — L97509 Non-pressure chronic ulcer of other part of unspecified foot with unspecified severity: Secondary | ICD-10-CM | POA: Diagnosis not present

## 2012-02-17 DIAGNOSIS — L98499 Non-pressure chronic ulcer of skin of other sites with unspecified severity: Secondary | ICD-10-CM | POA: Diagnosis not present

## 2012-02-17 DIAGNOSIS — I10 Essential (primary) hypertension: Secondary | ICD-10-CM | POA: Diagnosis not present

## 2012-02-17 DIAGNOSIS — E1159 Type 2 diabetes mellitus with other circulatory complications: Secondary | ICD-10-CM | POA: Diagnosis not present

## 2012-02-26 ENCOUNTER — Encounter: Payer: Self-pay | Admitting: Internal Medicine

## 2012-02-26 ENCOUNTER — Ambulatory Visit (INDEPENDENT_AMBULATORY_CARE_PROVIDER_SITE_OTHER): Payer: Medicare Other | Admitting: Internal Medicine

## 2012-02-26 VITALS — BP 110/60 | HR 69 | Temp 98.6°F | Ht 66.83 in | Wt 183.5 lb

## 2012-02-26 DIAGNOSIS — I2589 Other forms of chronic ischemic heart disease: Secondary | ICD-10-CM | POA: Diagnosis not present

## 2012-02-26 DIAGNOSIS — I70269 Atherosclerosis of native arteries of extremities with gangrene, unspecified extremity: Secondary | ICD-10-CM | POA: Diagnosis not present

## 2012-02-26 DIAGNOSIS — E1329 Other specified diabetes mellitus with other diabetic kidney complication: Secondary | ICD-10-CM

## 2012-02-26 DIAGNOSIS — L97509 Non-pressure chronic ulcer of other part of unspecified foot with unspecified severity: Secondary | ICD-10-CM | POA: Diagnosis not present

## 2012-02-26 DIAGNOSIS — E1321 Other specified diabetes mellitus with diabetic nephropathy: Secondary | ICD-10-CM

## 2012-02-26 DIAGNOSIS — N058 Unspecified nephritic syndrome with other morphologic changes: Secondary | ICD-10-CM

## 2012-02-26 DIAGNOSIS — I255 Ischemic cardiomyopathy: Secondary | ICD-10-CM

## 2012-02-26 DIAGNOSIS — I739 Peripheral vascular disease, unspecified: Secondary | ICD-10-CM | POA: Diagnosis not present

## 2012-02-26 DIAGNOSIS — I2510 Atherosclerotic heart disease of native coronary artery without angina pectoris: Secondary | ICD-10-CM

## 2012-02-26 DIAGNOSIS — Z23 Encounter for immunization: Secondary | ICD-10-CM

## 2012-02-26 DIAGNOSIS — E1165 Type 2 diabetes mellitus with hyperglycemia: Secondary | ICD-10-CM

## 2012-02-26 MED ORDER — INSULIN GLARGINE 100 UNIT/ML ~~LOC~~ SOLN: 30.0000 [IU] | Freq: Two times a day (BID) | SUBCUTANEOUS | Status: DC

## 2012-02-26 NOTE — Patient Instructions (Addendum)
Your big toe probably has a bone infection/     Try any of the Atkins snack size bars for in between meal sncks that will not raise your blood sugar  Avoid rice and macaroni with dinner.  Limit potatoes once a week., spaghetti once a week .  No tex[place] toast!!   Do not bread your meat before cooking   Baked or broiled chicken is fine,  But avoid fried because it is breaded  Avoid bananas, watermelon and grapes,  And pineapples. (think of them as candy or dessert)  Better morning fruits are cherries,  And all berries   A better breakfast avoids biscuits, oatmeal .  Try Arnolds Sandwhich thins (low carb)  toasted with peanut butter (no jelly) or  Or with scrambled eggs with cheese and breakfast meat (sausage or bacon,  No biscuit)   Please check your blood sugars before and after breakfast for the next 2 weeks   I will see you again for a visit in Devember and we will repeat your blood work

## 2012-02-26 NOTE — Progress Notes (Signed)
Patient ID: Pamela Collier, female   DOB: 08/07/1940, 71 y.o.   MRN: [id number]  Patient Active Problem List  Diagnosis  . Diabetes mellitus type 2, uncontrolled  . Double vessel coronary artery disease  . Cardiomyopathy, ischemic  . Hepatic steatosis  . Osteoporosis, post-menopausal  . 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 Atherosclerotic peripheral vascular disease with gangrene  . 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 Follow-up    HPI:   Pamela Collier a 71 y.o. female who presents for follow up on diabetes mellitus, hypertension and hyperlipidemia.  She reports several day history of  breast pain after her mammogram,  Now resolved,  Then 2 weeks later had recurrence of pain in the RUQ pain that was very lateral in location,  occurred at night and was brought on by turning and twisting in the bed.  She denies any new activities. Has done some yard work (raking leaves) but not prior to the onset of pain.  Has not had the pain since September.   2) DM:  She has charted her Pre and 2 hr post dinner sugars.  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 Vickki Muff for persistent toe ulcer on left Great toe.  Apparently Dr. Vickki Muff has been treating her for osteomyelitis (patient is unaware of diagnosis but says that he told he he may have to remove the tip of her toe)  Had vascular evaluation recently on leg but no reports are available.    Past Medical History  Diagnosis Date  . Diabetes mellitus   . 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 Cardiomyopathy, ischemic Jan 2013    EF 35 to 40%  [facility])  . Hepatic steatosis     by CT abd pelvis  . Osteoporosis, post-menopausal   . 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 Atherosclerotic peripheral vascular disease with gangrene august 2012    Past Surgical History  Procedure Date  . Abdominal hysterectomy     at ge 39. secondary to bleeding  . 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 Ptca August 2012    Right Posterior tibial artery , [place]         The following portions of the patient's history were reviewed and updated as appropriate: Allergies, current medications, and problem list.    Review of Systems:   12 Pt  review of systems was negative except those addressed in the HPI,     History   Social History  . Marital Status: Married    Spouse Name: N/A    Number of Children: N/A  . Years of Education: N/A   Occupational History  . Not on file.   Social History Main Topics  . Smoking status: Never Smoker   . Smokeless tobacco: Never Used  . Alcohol Use: No  . Drug Use: No  . Sexually Active: Not on file   Other Topics Concern  .  Not on file   Social History Narrative  . No narrative on file    Objective:  BP 110/60  Pulse 69  Temp 98.6 F (37 C) (Oral)  Ht 5' 6.83" (1.697 m)  Wt 183 lb 8 oz (83.235 kg)  BMI 28.89 kg/m2  SpO2 97%  General appearance: alert, cooperative and appears stated age Ears: normal TM's and external ear canals both ears Throat: lips, mucosa, and tongue normal; teeth and gums normal Neck: no adenopathy, no carotid bruit, supple, symmetrical, trachea midline and thyroid not enlarged, symmetric, no tenderness/mass/nodules Back: symmetric, no curvature. ROM normal. No CVA tenderness. Lungs: clear to auscultation bilaterally Heart: regular rate and rhythm, S1, S2 normal, no murmur, click, rub or gallop Abdomen: soft, non-tender; bowel sounds normal; no masses,  no  organomegaly Pulses: 2+ and symmetric Skin: Skin color, texture, turgor normal. No rashes or lesions Lymph nodes: Cervical, supraclavicular, and axillary nodes normal.  Assessment and Plan:  Diabetes mellitus type 2, uncontrolled hgba1c was 9.2 in September. Her pre and post prandial sugars are < 200 but > 150 on twice daily Lantus , 30 units in am and 24 units PM.  She will submit pre and post breakfast cbgs in two weeks.  We will convert Lantus to 70/30 for better control.    Atherosclerotic peripheral vascular disease with gangrene With prior PTCA on the right in 2011 prior to amputation of second toe.  She has a persistent ulcer on the left great toe that may require amputation, per patient.  Records from [facility] and [facility] requested.   Double vessel coronary artery disease Per cardiac cath , with medical management by [facility].   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  continue ACE inhibitor and aggressive control of BP.    Updated Medication List Outpatient Encounter Prescriptions as of 02/26/2012  Medication Sig Dispense Refill  . amLODipine (NORVASC) 10 MG tablet Take 1 tablet (10 mg total) by mouth daily.  30 tablet  3  . enalapril (VASOTEC) 20 MG tablet Take 1 tablet (20 mg total) by mouth daily.  30 tablet  3  . ibandronate (BONIVA) 150 MG tablet Take 1 tablet (150 mg total) by mouth every 30 (thirty) days. Take in the morning with a full glass of water, on an empty stomach, and do not take anything else by mouth or lie down for the next 30 min.  3 tablet  3  . insulin glargine (LANTUS) 100 UNIT/ML injection Inject 30 Units into the skin 2 (two) times daily. 24 at units at bedtime.  10 mL  5  . metFORMIN (GLUCOPHAGE) 1000 MG tablet Take 1 tablet (1,000 mg total) by mouth 2 (two) times daily with a meal.  60 tablet  3  . metoprolol tartrate  (LOPRESSOR) 25 MG tablet Take 1 tablet (25 mg total) by mouth 2 (two) times daily.  60 tablet  3  . pregabalin (LYRICA) 100 MG capsule Take 1 capsule (100 mg total) by mouth 3 (three) times daily.  90 capsule  3  . rosuvastatin (CRESTOR) 10 MG tablet Take 1 tablet (10 mg total) by mouth daily.  90 tablet  3  . DISCONTD: insulin glargine (LANTUS) 100 UNIT/ML injection Inject 20 Units into the skin every morning. 35 units at bedtime.  10 mL  5     Orders Placed This Encounter  Procedures  . HM MAMMOGRAPHY  .  Pneumococcal polysaccharide vaccine 23-valent greater than or equal to 2yo subcutaneous/IM  . HM COLONOSCOPY    No Follow-up on file.

## 2012-02-28 ENCOUNTER — Encounter: Payer: Self-pay | Admitting: Internal Medicine

## 2012-02-28 DIAGNOSIS — I255 Ischemic cardiomyopathy: Secondary | ICD-10-CM | POA: Insufficient documentation

## 2012-02-28 DIAGNOSIS — K760 Fatty (change of) liver, not elsewhere classified: Secondary | ICD-10-CM | POA: Insufficient documentation

## 2012-02-28 DIAGNOSIS — I70269 Atherosclerosis of native arteries of extremities with gangrene, unspecified extremity: Secondary | ICD-10-CM | POA: Insufficient documentation

## 2012-02-28 DIAGNOSIS — E1351 Other specified diabetes mellitus with diabetic peripheral angiopathy without gangrene: Secondary | ICD-10-CM | POA: Insufficient documentation

## 2012-02-28 DIAGNOSIS — M810 Age-related osteoporosis without current pathological fracture: Secondary | ICD-10-CM | POA: Insufficient documentation

## 2012-02-28 NOTE — Assessment & Plan Note (Addendum)
hgba1c was 9.2 in September. Her pre and post prandial sugars are < 200 but > 150 on twice daily Lantus , 30 units in am and 24 units PM.  She will submit pre and post breakfast cbgs in two weeks.  We will convert Lantus to 70/30 for better control.

## 2012-02-29 DIAGNOSIS — E1121 Type 2 diabetes mellitus with diabetic nephropathy: Secondary | ICD-10-CM | POA: Insufficient documentation

## 2012-02-29 NOTE — Assessment & Plan Note (Signed)
With microalbuminuria but normal renal function.  continue ACE inhibitor and aggressive control of BP.

## 2012-02-29 NOTE — Assessment & Plan Note (Signed)
With prior PTCA on the right in 2011 prior to amputation of second toe.  She has a persistent ulcer on the left great toe that may require amputation, per patient.  Records from [place] and [facility] requested.

## 2012-02-29 NOTE — Assessment & Plan Note (Signed)
Per cardiac cath , with medical management by [facility].

## 2012-02-29 NOTE — Assessment & Plan Note (Signed)
Most recent EF 35 to 40% , on appropraite medications

## 2012-03-08 DIAGNOSIS — L97509 Non-pressure chronic ulcer of other part of unspecified foot with unspecified severity: Secondary | ICD-10-CM | POA: Diagnosis not present

## 2012-03-15 ENCOUNTER — Ambulatory Visit: Payer: Self-pay | Admitting: Internal Medicine

## 2012-03-15 ENCOUNTER — Encounter: Payer: Self-pay | Admitting: Internal Medicine

## 2012-03-15 ENCOUNTER — Ambulatory Visit (INDEPENDENT_AMBULATORY_CARE_PROVIDER_SITE_OTHER): Payer: Medicare Other | Admitting: Internal Medicine

## 2012-03-15 ENCOUNTER — Ambulatory Visit: Payer: Medicare Other | Admitting: Internal Medicine

## 2012-03-15 VITALS — BP 167/80 | HR 82 | Temp 99.2°F | Resp 12 | Ht 66.0 in | Wt 187.5 lb

## 2012-03-15 DIAGNOSIS — J988 Other specified respiratory disorders: Secondary | ICD-10-CM

## 2012-03-15 DIAGNOSIS — J811 Chronic pulmonary edema: Secondary | ICD-10-CM | POA: Diagnosis not present

## 2012-03-15 DIAGNOSIS — R0689 Other abnormalities of breathing: Secondary | ICD-10-CM

## 2012-03-15 DIAGNOSIS — R0609 Other forms of dyspnea: Secondary | ICD-10-CM | POA: Diagnosis not present

## 2012-03-15 DIAGNOSIS — I2589 Other forms of chronic ischemic heart disease: Secondary | ICD-10-CM | POA: Diagnosis not present

## 2012-03-15 DIAGNOSIS — J22 Unspecified acute lower respiratory infection: Secondary | ICD-10-CM

## 2012-03-15 DIAGNOSIS — I255 Ischemic cardiomyopathy: Secondary | ICD-10-CM

## 2012-03-15 DIAGNOSIS — R0989 Other specified symptoms and signs involving the circulatory and respiratory systems: Secondary | ICD-10-CM | POA: Diagnosis not present

## 2012-03-15 DIAGNOSIS — R0600 Dyspnea, unspecified: Secondary | ICD-10-CM

## 2012-03-15 LAB — CBC WITH DIFFERENTIAL/PLATELET
Basophils Absolute: 0 10*3/uL (ref 0.0–0.1)
HCT: 35.2 % — ABNORMAL LOW (ref 36.0–46.0)
Hemoglobin: 11.6 g/dL — ABNORMAL LOW (ref 12.0–15.0)
Lymphs Abs: 2.1 10*3/uL (ref 0.7–4.0)
MCHC: 33.1 g/dL (ref 30.0–36.0)
Monocytes Relative: 9.4 % (ref 3.0–12.0)
Neutro Abs: 5.5 10*3/uL (ref 1.4–7.7)
RDW: 13.4 % (ref 11.5–14.6)

## 2012-03-15 LAB — POCT INFLUENZA A/B: Influenza A, POC: NEGATIVE

## 2012-03-15 LAB — BRAIN NATRIURETIC PEPTIDE: Pro B Natriuretic peptide (BNP): 340 pg/mL — ABNORMAL HIGH (ref 0.0–100.0)

## 2012-03-15 MED ORDER — FUROSEMIDE 20 MG PO TABS: 20.0000 mg | ORAL_TABLET | Freq: Every day | ORAL | Status: DC

## 2012-03-15 MED ORDER — PREDNISONE (PAK) 10 MG PO TABS: ORAL_TABLET | ORAL | Status: DC

## 2012-03-15 MED ORDER — METHYLPREDNISOLONE ACETATE 40 MG/ML IJ SUSP
40.0000 mg | Freq: Once | INTRAMUSCULAR | Status: AC
  Administered 2012-03-15: 40 mg via INTRAMUSCULAR

## 2012-03-15 MED ORDER — AZITHROMYCIN 500 MG PO TABS: ORAL_TABLET | ORAL | Status: DC

## 2012-03-15 NOTE — Patient Instructions (Addendum)
Your flu test was negative.  i am treating your for a respiratory infection  And checking your bloodwork for signs of heart failure.  Please go get a chest x ray at [facility].   Take the prednisone in a tapering dose for the next 6 days starting tomorrow.  Start the antibiotic (azithromycin ) today  Take the furosemide in the morning as needed for wt gain overnight of > 1 lb or for swelling in your legs.

## 2012-03-15 NOTE — Progress Notes (Signed)
Patient ID: Pamela Collier, female   DOB: 12/18/1940, 71 y.o.   MRN: [id number] Patient Active Problem List  Diagnosis  . Diabetes mellitus type 2, uncontrolled  . Double vessel coronary artery disease  . Cardiomyopathy, ischemic  . Hepatic steatosis  . Osteoporosis, post-menopausal  . 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 Atherosclerotic peripheral vascular disease with gangrene  . 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 Acute lower respiratory tract infection    Subjective:  CC:   Chief Complaint  Patient presents with  . Cough  . Shortness of Breath  . Wheezing    HPI:   Pamela Collier a 71 y.o. female who presents Shortness of breath and wheezing started 3 days of ago.  Diffuse body aches.  Cervical lymphadenopathy,  No subjective fevers .  Short of breath with exertion, made worse by lying down, which has improved.  Chest pain  Mild , feels like tightness,, with frontal headache.    Past Medical History  Diagnosis Date  . Diabetes mellitus   . 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 Cardiomyopathy, ischemic Jan 2013    EF 35 to 40% [facility])  . Hepatic steatosis     by CT abd pelvis  . Osteoporosis, post-menopausal   . 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 Atherosclerotic peripheral vascular disease with gangrene august 2012    Past Surgical History  Procedure Date  . Abdominal hysterectomy     at ge 39. secondary to bleeding  . 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 Ptca August 2012    Right Posterior tibial artery , [place]         The following portions of the patient's history were reviewed and updated as appropriate: Allergies, current medications, and problem list.    Review of Systems:   12 Pt  review of systems was negative except those addressed in the HPI,     History    Social History  . Marital Status: Married    Spouse Name: N/A    Number of Children: N/A  . Years of Education: N/A   Occupational History  . Not on file.   Social History Main Topics  . Smoking status: Never Smoker   . Smokeless tobacco: Never Used  . Alcohol Use: No  . Drug Use: No  . Sexually Active: Not on file   Other Topics Concern  . Not on file   Social History Narrative  . No narrative on file    Objective:  BP 167/80  Pulse 82  Temp 99.2 F (37.3 C) (Oral)  Resp 12  Ht 5\' 6"  (1.676 m)  Wt 187 lb 8 oz (85.049 kg)  BMI 30.26 kg/m2  SpO2 95%  General appearance: alert, cooperative and appears stated age Ears: normal TM's and external ear canals both ears Throat: lips, mucosa, and tongue normal; teeth and gums normal Neck: no adenopathy, no carotid bruit, supple, symmetrical, trachea midline and thyroid not enlarged, symmetric, no tenderness/mass/nodules Back: symmetric, no curvature. ROM normal. No CVA tenderness. Lungs: bilatral mild expiratory wheezes and ronchi,  No rales.  Heart: regular rate and rhythm, S1, S2 normal, no murmur, click, rub or gallop Abdomen: soft, non-tender; bowel sounds normal; no masses,  no organomegaly Pulses: 2+ and symmetric. No LE edema.  Skin: Skin color, texture, turgor normal. No rashes or lesions Lymph nodes: Cervical, supraclavicular, and axillary nodes normal.  Assessment and Plan:  Acute lower respiratory tract infection With mild wheezing on exam.  Her rapid influenza test was negative. I ordered a chest x-ray, antibiotics and steroids. .  Given her history of coronary artery disease and ordered a troponin which came back intermediate at 0.11.Chest x-ray done on November 4 showed shallow inspiration infiltrate versus atelectasis bilaterally in the lower lung fields and possibly a component of pulmonary vascular congestion. Chronic rib fractures on the left were also noted.  She is feeling better 24 hours later.    Cardiomyopathy, ischemic Her troponin was slightly elevated at 0.11 during office visit for acute respiratory symptoms. Given her systemic symptoms of infection I treated her for lower respiratory infection with empiric antibiotics. Given her weight gain of up 3 pounds since last visit I will have her increase her furosemide if her weight is still up  On repeat visit.   Updated Medication List Outpatient Encounter Prescriptions as of 03/15/2012  Medication Sig Dispense Refill  . amLODipine (NORVASC) 10 MG tablet Take 1 tablet (10 mg total) by mouth daily.  30 tablet  3  . enalapril (VASOTEC) 20 MG tablet Take 1 tablet (20 mg total) by mouth daily.  30 tablet  3  . ibandronate (BONIVA) 150 MG tablet Take 1 tablet (150 mg total) by mouth every 30 (thirty) days. Take in the morning with a full glass of water, on an empty stomach, and do not take anything else by mouth or lie down for the next 30 min.  3 tablet  3  . insulin glargine (LANTUS) 100 UNIT/ML injection Inject 30 Units into the skin 2 (two) times daily. 24 at units at bedtime.  10 mL  5  . metFORMIN (GLUCOPHAGE) 1000 MG tablet Take 1 tablet (1,000 mg total) by mouth 2 (two) times daily with a meal.  60 tablet  3  . metoprolol tartrate (LOPRESSOR) 25 MG tablet Take 1 tablet (25 mg total) by mouth 2 (two) times daily.  60 tablet  3  . pregabalin (LYRICA) 100 MG capsule Take 1 capsule (100 mg total) by mouth 3 (three) times daily.  90 capsule  3  . rosuvastatin (CRESTOR) 10 MG tablet Take 1 tablet (10 mg total) by mouth daily.  90 tablet  3  . azithromycin (ZITHROMAX) 500 MG tablet 1 tablet daily for 7 days  7 tablet  0  . furosemide (LASIX) 20 MG tablet Take 1 tablet (20 mg total) by mouth daily.  30 tablet  3  . predniSONE (STERAPRED UNI-PAK) 10 MG tablet 6 tablets on Day 1 , then reduce by 1 tablet daily until gone  21 tablet  0  . [EXPIRED] methylPREDNISolone acetate (DEPO-MEDROL) injection 40 mg          Orders Placed This Encounter   Procedures  . DG Chest 2 View  . CBC with Differential  . B Nat Peptide  . Troponin I  . POCT Influenza A/B    Return in about 1 week (around 03/22/2012).

## 2012-03-16 DIAGNOSIS — I739 Peripheral vascular disease, unspecified: Secondary | ICD-10-CM | POA: Diagnosis not present

## 2012-03-16 DIAGNOSIS — J22 Unspecified acute lower respiratory infection: Secondary | ICD-10-CM | POA: Insufficient documentation

## 2012-03-16 DIAGNOSIS — L97909 Non-pressure chronic ulcer of unspecified part of unspecified lower leg with unspecified severity: Secondary | ICD-10-CM | POA: Diagnosis not present

## 2012-03-16 LAB — TROPONIN I: Troponin I: 0.11 ng/mL — ABNORMAL HIGH (ref <0.06)

## 2012-03-16 NOTE — Assessment & Plan Note (Addendum)
With mild wheezing on exam.  Her rapid influenza test was negative. I ordered a chest x-ray, antibiotics and steroids. .  Given her history of coronary artery disease and ordered a troponin which came back intermediate at 0.11.Chest x-ray done on November 4 showed shallow inspiration infiltrate versus atelectasis bilaterally in the lower lung fields and possibly a component of pulmonary vascular congestion. Chronic rib fractures on the left were also noted.  She is feeling better 24 hours later.

## 2012-03-17 NOTE — Assessment & Plan Note (Signed)
Her troponin was slightly elevated at 0.11 during office visit for acute respiratory symptoms. Given her systemic symptoms of infection I treated her for lower respiratory infection with empiric antibiotics. Given her weight gain of up 3 pounds since last visit I will have her increase her furosemide if her weight is still up  On repeat visit.

## 2012-03-18 ENCOUNTER — Inpatient Hospital Stay: Payer: Self-pay | Admitting: Internal Medicine

## 2012-03-18 DIAGNOSIS — I2510 Atherosclerotic heart disease of native coronary artery without angina pectoris: Secondary | ICD-10-CM | POA: Diagnosis present

## 2012-03-18 DIAGNOSIS — Z7982 Long term (current) use of aspirin: Secondary | ICD-10-CM | POA: Diagnosis not present

## 2012-03-18 DIAGNOSIS — Z23 Encounter for immunization: Secondary | ICD-10-CM | POA: Diagnosis not present

## 2012-03-18 DIAGNOSIS — R259 Unspecified abnormal involuntary movements: Secondary | ICD-10-CM | POA: Diagnosis present

## 2012-03-18 DIAGNOSIS — J9600 Acute respiratory failure, unspecified whether with hypoxia or hypercapnia: Secondary | ICD-10-CM | POA: Diagnosis present

## 2012-03-18 DIAGNOSIS — I509 Heart failure, unspecified: Secondary | ICD-10-CM | POA: Diagnosis present

## 2012-03-18 DIAGNOSIS — J209 Acute bronchitis, unspecified: Secondary | ICD-10-CM | POA: Diagnosis present

## 2012-03-18 DIAGNOSIS — M1990 Unspecified osteoarthritis, unspecified site: Secondary | ICD-10-CM | POA: Diagnosis present

## 2012-03-18 DIAGNOSIS — I5021 Acute systolic (congestive) heart failure: Secondary | ICD-10-CM | POA: Diagnosis not present

## 2012-03-18 DIAGNOSIS — K5900 Constipation, unspecified: Secondary | ICD-10-CM | POA: Diagnosis present

## 2012-03-18 DIAGNOSIS — Z794 Long term (current) use of insulin: Secondary | ICD-10-CM | POA: Diagnosis not present

## 2012-03-18 DIAGNOSIS — E119 Type 2 diabetes mellitus without complications: Secondary | ICD-10-CM | POA: Diagnosis present

## 2012-03-18 DIAGNOSIS — E785 Hyperlipidemia, unspecified: Secondary | ICD-10-CM | POA: Diagnosis present

## 2012-03-18 DIAGNOSIS — R0989 Other specified symptoms and signs involving the circulatory and respiratory systems: Secondary | ICD-10-CM | POA: Diagnosis not present

## 2012-03-18 DIAGNOSIS — I10 Essential (primary) hypertension: Secondary | ICD-10-CM | POA: Diagnosis not present

## 2012-03-18 DIAGNOSIS — R0602 Shortness of breath: Secondary | ICD-10-CM | POA: Diagnosis not present

## 2012-03-18 DIAGNOSIS — J189 Pneumonia, unspecified organism: Secondary | ICD-10-CM | POA: Diagnosis not present

## 2012-03-18 DIAGNOSIS — I5033 Acute on chronic diastolic (congestive) heart failure: Secondary | ICD-10-CM | POA: Diagnosis present

## 2012-03-18 DIAGNOSIS — M810 Age-related osteoporosis without current pathological fracture: Secondary | ICD-10-CM | POA: Diagnosis present

## 2012-03-18 LAB — URINALYSIS, COMPLETE
Bacteria: NONE SEEN
Bilirubin,UR: NEGATIVE
Glucose,UR: >=500 mg/dL
Hyaline Cast: 1
Ketone: NEGATIVE
Nitrite: NEGATIVE
Ph: 5
Protein: 30
RBC,UR: <1 /HPF
Specific Gravity: 1.003
Squamous Epithelial: 1
WBC UR: 5 /HPF

## 2012-03-18 LAB — PRO B NATRIURETIC PEPTIDE: B-Type Natriuretic Peptide: 2881 pg/mL — ABNORMAL HIGH

## 2012-03-18 LAB — CBC
HCT: 34 % — ABNORMAL LOW
HGB: 11.2 g/dL — ABNORMAL LOW
MCH: 28.8 pg
MCHC: 33 g/dL
MCV: 87 fL
Platelet: 419 x10 3/mm 3
RBC: 3.88 X10 6/mm 3
RDW: 13.6 %
WBC: 9.5 x10 3/mm 3

## 2012-03-18 LAB — COMPREHENSIVE METABOLIC PANEL
Alkaline Phosphatase: 60 U/L (ref 50–136)
Anion Gap: 10 (ref 7–16)
BUN: 13 mg/dL (ref 7–18)
Bilirubin,Total: 0.5 mg/dL (ref 0.2–1.0)
Calcium, Total: 8.5 mg/dL (ref 8.5–10.1)
Chloride: 106 mmol/L (ref 98–107)
Co2: 26 mmol/L (ref 21–32)
Creatinine: 0.72 mg/dL (ref 0.60–1.30)
EGFR (Non-African Amer.): >60
Osmolality: 282 (ref 275–301)
Potassium: 3.4 mmol/L — ABNORMAL LOW (ref 3.5–5.1)
Sodium: 142 mmol/L (ref 136–145)

## 2012-03-18 LAB — CK TOTAL AND CKMB (NOT AT ARMC)
CK, Total: 219 U/L — ABNORMAL HIGH
CK-MB: 3.5 ng/mL

## 2012-03-18 MED ORDER — ALBUTEROL SULFATE (5 MG/ML) 0.5% IN NEBU: 2.5000 mg | INHALATION_SOLUTION | Freq: Once | RESPIRATORY_TRACT | Status: AC

## 2012-03-18 NOTE — Addendum Note (Signed)
Addended by: Julieta Bellini on: 03/18/2012 09:15 AM   Modules accepted: Orders

## 2012-03-19 LAB — CBC WITH DIFFERENTIAL/PLATELET
Basophil %: 0.1 %
Eosinophil #: 0 10*3/uL (ref 0.0–0.7)
Eosinophil %: 0 %
HCT: 32.4 % — ABNORMAL LOW (ref 35.0–47.0)
HGB: 11.1 g/dL — ABNORMAL LOW (ref 12.0–16.0)
Lymphocyte #: 0.4 10*3/uL — ABNORMAL LOW (ref 1.0–3.6)
Lymphocyte %: 6 %
MCH: 30.2 pg (ref 26.0–34.0)
MCHC: 34.4 g/dL (ref 32.0–36.0)
MCV: 88 fL (ref 80–100)
Monocyte %: 5.3 %
Neutrophil %: 88.6 %
RBC: 3.69 10*6/uL — ABNORMAL LOW (ref 3.80–5.20)
RDW: 13.6 % (ref 11.5–14.5)
WBC: 7.1 10*3/uL (ref 3.6–11.0)

## 2012-03-19 LAB — BASIC METABOLIC PANEL
Anion Gap: 8 (ref 7–16)
BUN: 18 mg/dL (ref 7–18)
Co2: 26 mmol/L (ref 21–32)
Creatinine: 1.19 mg/dL (ref 0.60–1.30)
EGFR (Non-African Amer.): 46 — ABNORMAL LOW
Glucose: 349 mg/dL — ABNORMAL HIGH (ref 65–99)
Osmolality: 288 (ref 275–301)
Potassium: 4.5 mmol/L (ref 3.5–5.1)
Sodium: 136 mmol/L (ref 136–145)

## 2012-03-19 LAB — LIPID PANEL
HDL Cholesterol: 41 mg/dL (ref 40–60)
Ldl Cholesterol, Calc: 71 mg/dL (ref 0–100)
VLDL Cholesterol, Calc: 22 mg/dL (ref 5–40)

## 2012-03-19 MED ORDER — ALBUTEROL SULFATE (2.5 MG/3ML) 0.083% IN NEBU
2.5000 mg | INHALATION_SOLUTION | Freq: Once | RESPIRATORY_TRACT | Status: AC
  Administered 2012-03-19: 2.5 mg via RESPIRATORY_TRACT

## 2012-03-19 MED ORDER — METHYLPREDNISOLONE ACETATE 40 MG/ML IJ SUSP
40.0000 mg | Freq: Once | INTRAMUSCULAR | Status: AC
  Administered 2012-03-19: 40 mg via INTRAMUSCULAR

## 2012-03-19 NOTE — Addendum Note (Signed)
Addended by: Julieta Bellini on: 03/19/2012 10:04 AM   Modules accepted: Orders

## 2012-03-20 LAB — BASIC METABOLIC PANEL
BUN: 28 mg/dL — ABNORMAL HIGH (ref 7–18)
Chloride: 102 mmol/L (ref 98–107)
Co2: 29 mmol/L (ref 21–32)
Creatinine: 1.09 mg/dL (ref 0.60–1.30)
EGFR (African American): 59 — ABNORMAL LOW
EGFR (Non-African Amer.): 51 — ABNORMAL LOW
Potassium: 4 mmol/L (ref 3.5–5.1)
Sodium: 139 mmol/L (ref 136–145)

## 2012-03-22 ENCOUNTER — Telehealth: Payer: Self-pay | Admitting: Internal Medicine

## 2012-03-22 NOTE — Telephone Encounter (Signed)
I called  [facility] back and spoke with Gregary Signs and gave her the verbal that Dr. Derrel Nip gave. I will also call patient and schedule HFU.

## 2012-03-22 NOTE — Telephone Encounter (Signed)
Yes I will authorize home health nursing. Please arrange a hospital follow up

## 2012-03-22 NOTE — Telephone Encounter (Signed)
I called and made follow up appt for Wednesday Oct. 20,2013 at 11:15.

## 2012-03-22 NOTE — Telephone Encounter (Signed)
Patient was discharged on Sunday from [facility] and they referred her for nursing services to [facility].  She would like a verbal okay from Dr. Derrel Nip saying it was okay and also to sign orders if needed.

## 2012-03-23 ENCOUNTER — Telehealth: Payer: Self-pay | Admitting: Internal Medicine

## 2012-03-23 DIAGNOSIS — I10 Essential (primary) hypertension: Secondary | ICD-10-CM | POA: Diagnosis not present

## 2012-03-23 DIAGNOSIS — E785 Hyperlipidemia, unspecified: Secondary | ICD-10-CM | POA: Diagnosis not present

## 2012-03-23 DIAGNOSIS — I5030 Unspecified diastolic (congestive) heart failure: Secondary | ICD-10-CM | POA: Diagnosis not present

## 2012-03-23 DIAGNOSIS — I2510 Atherosclerotic heart disease of native coronary artery without angina pectoris: Secondary | ICD-10-CM | POA: Diagnosis not present

## 2012-03-23 DIAGNOSIS — L84 Corns and callosities: Secondary | ICD-10-CM | POA: Diagnosis not present

## 2012-03-23 DIAGNOSIS — M810 Age-related osteoporosis without current pathological fracture: Secondary | ICD-10-CM | POA: Diagnosis not present

## 2012-03-23 DIAGNOSIS — Z794 Long term (current) use of insulin: Secondary | ICD-10-CM | POA: Diagnosis not present

## 2012-03-23 DIAGNOSIS — E119 Type 2 diabetes mellitus without complications: Secondary | ICD-10-CM | POA: Diagnosis not present

## 2012-03-23 DIAGNOSIS — I509 Heart failure, unspecified: Secondary | ICD-10-CM | POA: Diagnosis not present

## 2012-03-23 NOTE — Telephone Encounter (Signed)
Pamela Collier from [facility] requesting to add a social work for financial reasons.

## 2012-03-23 NOTE — Telephone Encounter (Signed)
See note below,  thanks

## 2012-03-23 NOTE — Telephone Encounter (Signed)
Social work request authorized, please give voice order

## 2012-03-25 DIAGNOSIS — E119 Type 2 diabetes mellitus without complications: Secondary | ICD-10-CM | POA: Diagnosis not present

## 2012-03-25 DIAGNOSIS — I5030 Unspecified diastolic (congestive) heart failure: Secondary | ICD-10-CM | POA: Diagnosis not present

## 2012-03-25 DIAGNOSIS — M810 Age-related osteoporosis without current pathological fracture: Secondary | ICD-10-CM | POA: Diagnosis not present

## 2012-03-25 DIAGNOSIS — L84 Corns and callosities: Secondary | ICD-10-CM | POA: Diagnosis not present

## 2012-03-25 DIAGNOSIS — I10 Essential (primary) hypertension: Secondary | ICD-10-CM | POA: Diagnosis not present

## 2012-03-25 DIAGNOSIS — I509 Heart failure, unspecified: Secondary | ICD-10-CM | POA: Diagnosis not present

## 2012-03-25 NOTE — Telephone Encounter (Signed)
Spoke to [employer] @ [phone] [facility] to give verbal order for social worker for financial reason.

## 2012-03-26 ENCOUNTER — Encounter: Payer: Self-pay | Admitting: Internal Medicine

## 2012-03-26 DIAGNOSIS — E119 Type 2 diabetes mellitus without complications: Secondary | ICD-10-CM | POA: Diagnosis not present

## 2012-03-26 DIAGNOSIS — L84 Corns and callosities: Secondary | ICD-10-CM | POA: Diagnosis not present

## 2012-03-26 DIAGNOSIS — M810 Age-related osteoporosis without current pathological fracture: Secondary | ICD-10-CM | POA: Diagnosis not present

## 2012-03-26 DIAGNOSIS — I509 Heart failure, unspecified: Secondary | ICD-10-CM | POA: Diagnosis not present

## 2012-03-26 DIAGNOSIS — I5030 Unspecified diastolic (congestive) heart failure: Secondary | ICD-10-CM | POA: Diagnosis not present

## 2012-03-26 DIAGNOSIS — I10 Essential (primary) hypertension: Secondary | ICD-10-CM | POA: Diagnosis not present

## 2012-03-29 DIAGNOSIS — M810 Age-related osteoporosis without current pathological fracture: Secondary | ICD-10-CM | POA: Diagnosis not present

## 2012-03-29 DIAGNOSIS — E119 Type 2 diabetes mellitus without complications: Secondary | ICD-10-CM | POA: Diagnosis not present

## 2012-03-29 DIAGNOSIS — I10 Essential (primary) hypertension: Secondary | ICD-10-CM | POA: Diagnosis not present

## 2012-03-29 DIAGNOSIS — I5030 Unspecified diastolic (congestive) heart failure: Secondary | ICD-10-CM | POA: Diagnosis not present

## 2012-03-29 DIAGNOSIS — L84 Corns and callosities: Secondary | ICD-10-CM | POA: Diagnosis not present

## 2012-03-29 DIAGNOSIS — I509 Heart failure, unspecified: Secondary | ICD-10-CM | POA: Diagnosis not present

## 2012-03-31 ENCOUNTER — Ambulatory Visit (INDEPENDENT_AMBULATORY_CARE_PROVIDER_SITE_OTHER): Payer: Medicare Other | Admitting: Internal Medicine

## 2012-03-31 ENCOUNTER — Encounter: Payer: Self-pay | Admitting: Internal Medicine

## 2012-03-31 VITALS — BP 128/58 | HR 67 | Temp 99.1°F | Resp 12 | Ht 66.0 in | Wt 177.5 lb

## 2012-03-31 DIAGNOSIS — I70269 Atherosclerosis of native arteries of extremities with gangrene, unspecified extremity: Secondary | ICD-10-CM

## 2012-03-31 DIAGNOSIS — Z1331 Encounter for screening for depression: Secondary | ICD-10-CM | POA: Diagnosis not present

## 2012-03-31 DIAGNOSIS — I2589 Other forms of chronic ischemic heart disease: Secondary | ICD-10-CM | POA: Diagnosis not present

## 2012-03-31 DIAGNOSIS — E1165 Type 2 diabetes mellitus with hyperglycemia: Secondary | ICD-10-CM

## 2012-03-31 DIAGNOSIS — F418 Other specified anxiety disorders: Secondary | ICD-10-CM

## 2012-03-31 DIAGNOSIS — F341 Dysthymic disorder: Secondary | ICD-10-CM

## 2012-03-31 DIAGNOSIS — I255 Ischemic cardiomyopathy: Secondary | ICD-10-CM

## 2012-03-31 MED ORDER — ESZOPICLONE 2 MG PO TABS: 2.0000 mg | ORAL_TABLET | Freq: Every day | ORAL | Status: DC

## 2012-03-31 NOTE — Progress Notes (Signed)
Patient ID: Pamela Collier, female   DOB: 11-May-1941, 71 y.o.   MRN: [id number]  Patient Active Problem List  Diagnosis  . Diabetes mellitus type 2, uncontrolled  . Double vessel coronary artery disease  . Cardiomyopathy, ischemic  . Hepatic steatosis  . Osteoporosis, post-menopausal  . 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 Atherosclerotic peripheral vascular disease with gangrene  . 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 Depression with anxiety    Subjective:  CC:   Chief Complaint  Patient presents with  . Annual Exam    HPI:   Pamela Collier a 71 y.o. female who presents for Hospital follow up from [facility] admission in early November.  She was admitted and treated for respiratory failure secondary to  CHF and pneumonia after failing outpatient treatment for bronchitis.  She was discharged home on lasix, twice daily,  levaquin x 4 days and a prednisone taper.    She has been screened for depression today and had many affirmative answers.  Her home life is not supportive.  Her husband and son aggravate her a lot so she retreats to her room. She states that they are critical of her and boss her around a lot.  Husband constantly fusses about money; she thinks it is because he spends it on nonessentials including gambling on the interne and other places.  He leaves the house at night frequently for 3 to 4 hours at a time . She denies any history of physical abuse and falls. She is not sleeping well at night, due to occasional pain and episodes of pain and worry about her health since her diagnosis of heart failure.  She is worried that she is going to die. Has a home health Rn .    Past Medical History  Diagnosis Date  . Diabetes mellitus   . 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 Cardiomyopathy, ischemic Jan 2013    EF 35 to 40% [facility])  . Hepatic steatosis     by CT abd pelvis  . Osteoporosis,  post-menopausal   . 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 Atherosclerotic peripheral vascular disease with gangrene august 2012    Past Surgical History  Procedure Date  . Abdominal hysterectomy     at ge 39. secondary to bleeding  . 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 Ptca August 2012    Right Posterior tibial artery , Dew    The following portions of the patient's history were reviewed and updated as appropriate: Allergies, current medications, and problem list.    Review of Systems: Patient denies headache, fevers, malaise, unintentional weight loss, skin rash, eye pain, sinus congestion and sinus pain, sore throat, dysphagia,  hemoptysis , cough,  wheezing, chest pain, palpitations, orthopnea, edema, abdominal pain, nausea, melena, diarrhea, constipation, flank pain, dysuria, hematuria, urinary  Frequency, nocturia, numbness, tingling, seizures,  Focal weakness, Loss of consciousness,  Tremor, and suicidal ideation.        History   Social History  . Marital Status: Married    Spouse Name: N/A    Number of Children: N/A  . Years of Education: N/A   Occupational History  . Not on file.   Social History Main Topics  . Smoking status: Never Smoker   . Smokeless tobacco: Never  Used  . Alcohol Use: No  . Drug Use: No  . Sexually Active: Not on file   Other Topics Concern  . Not on file   Social History Narrative  . No narrative on file    Objective:  BP 128/58  Pulse 67  Temp 99.1 F (37.3 C) (Oral)  Resp 12  Ht 5\' 6"  (1.676 m)  Wt 177 lb 8 oz (80.513 kg)  BMI 28.65 kg/m2  SpO2 97%  General appearance: alert, cooperative and appears stated age Ears: normal TM's and external ear canals both ears Throat: lips, mucosa, and tongue normal; teeth and gums normal Neck: no adenopathy, no carotid bruit, supple, symmetrical, trachea midline and thyroid not  enlarged, symmetric, no tenderness/mass/nodules Back: symmetric, no curvature. ROM normal. No CVA tenderness. Lungs: clear to auscultation bilaterally Heart: regular rate and rhythm, S1, S2 normal, no murmur, click, rub or gallop Abdomen: soft, non-tender; bowel sounds normal; no masses,  no organomegaly Pulses: 2+ and symmetric Skin: Skin color, texture, turgor normal. No rashes or lesions Lymph nodes: Cervical, supraclavicular, and axillary nodes normal.  Assessment and Plan:  Diabetes mellitus type 2, uncontrolled Aggravated by steroids.  Her Lantus dose was increased to 30 units twice daily today and Low GI diet discussed.   Return in two weeks for evaluation of blood sugars and consideration go change to 70/30 bid   Cardiomyopathy, ischemic With recent admission for respiratory failure secondary to pneumonia and pulmonary edema.  Troponins were negative.  EF was 65% with MR and TR noted.  She is on apprpriate medications.  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 Vickki Muff.   Depression with anxiety Her chief symptoms are insomnia and anxiety surroudning her serious health issues.  Trial of lunesta today, samples given.     Updated Medication List Outpatient Encounter Prescriptions as of 03/31/2012  Medication Sig Dispense Refill  . acetaminophen (TYLENOL) 325 MG tablet Take 650 mg by mouth every 4 (four) hours as needed.      [name] amLODipine (NORVASC) 10 MG tablet Take 1 tablet (10 mg total) by mouth daily.  30 tablet  3  . enalapril (VASOTEC) 20 MG tablet Take 1 tablet (20 mg total) by mouth daily.  30 tablet  3  . furosemide (LASIX) 20 MG tablet Take 1 tablet (20  mg total) by mouth daily.  30 tablet  3  . ibandronate (BONIVA) 150 MG tablet Take 1 tablet (150 mg total) by mouth every 30 (thirty) days. Take in the morning with a full glass of water, on an empty stomach, and do not take anything else by mouth or lie down for the next 30 min.  3 tablet  3  . insulin glargine (LANTUS) 100 UNIT/ML injection Inject 30 Units into the skin 2 (two) times daily. 24 at units at bedtime.  10 mL  5  . Ipratropium-Albuterol (COMBIVENT RESPIMAT) 20-100 MCG/ACT AERS respimat Inhale 1 puff into the lungs every 6 (six) hours.      . metFORMIN (GLUCOPHAGE) 1000 MG tablet Take 1 tablet (1,000 mg total) by mouth 2 (two) times daily with a meal.  60 tablet  3  . metoprolol tartrate (LOPRESSOR) 25 MG tablet  Take 1 tablet (25 mg total) by mouth 2 (two) times daily.  60 tablet  3  . Polyethylene Glycol 3350 (MIRALAX PO) Take by mouth.      . pregabalin (LYRICA) 100 MG capsule Take 1 capsule (100 mg total) by mouth 3 (three) times daily.  90 capsule  3  . rosuvastatin (CRESTOR) 10 MG tablet Take 1 tablet (10 mg total) by mouth daily.  90 tablet  3  . [DISCONTINUED] azithromycin (ZITHROMAX) 500 MG tablet 1 tablet daily for 7 days  7 tablet  0  . [DISCONTINUED] levofloxacin (LEVAQUIN) 500 MG tablet Take 500 mg by mouth daily. Take 1 tablet every 24 hours for 4 days      . [DISCONTINUED] predniSONE (STERAPRED UNI-PAK) 10 MG tablet 6 tablets on Day 1 , then reduce by 1 tablet daily until gone  21 tablet  0  . eszopiclone (LUNESTA) 2 MG TABS Take 1 tablet (2 mg total) by mouth at bedtime. Take immediately before bedtime  7 tablet  0   Facility-Administered Encounter Medications as of 03/31/2012  Medication Dose Route Frequency Provider Last Rate Last Dose  . albuterol (PROVENTIL) (5 MG/ML) 0.5% nebulizer solution 2.5 mg  2.5 mg Nebulization Once [name], MD         No orders of the defined types were placed in this encounter.    Return in about 2 weeks (around 04/14/2012).

## 2012-03-31 NOTE — Patient Instructions (Addendum)
Reduce your furosemide to one  20 mg tablet daily in the morning after weighing .  Keep weighing yourself before you eat and after you empty your bladder.  If your weight increases by 1 or 2 lbs overnight,  Take an extra dose of furosemide in the morning ( 40 mg total)   Your blood sugars were elevated because of the prednisone you took for a few days  Try using low sodium soups.   Avoid combing carrots and potatoes because they are both high in sugar   Spinach,  Broccoli, brussel sprouts,  Cauliflower,  Peas,  Lima beans are all lower in sugar squash ok too  You can continue to use Equal or Splenda as your sweetener   Increase your Lantus dose to 30 units twice daily   We are going to switch you to a different type of insulin to use twice daily .    Return in 2 weeks with your log of blood sugars  So we can decide how much to use of the new insuln   We will try Lunesta to help you rest  2 mg at bedtime   ,  smaplesw given

## 2012-04-01 ENCOUNTER — Telehealth: Payer: Self-pay | Admitting: Internal Medicine

## 2012-04-01 DIAGNOSIS — L84 Corns and callosities: Secondary | ICD-10-CM | POA: Diagnosis not present

## 2012-04-01 DIAGNOSIS — I10 Essential (primary) hypertension: Secondary | ICD-10-CM | POA: Diagnosis not present

## 2012-04-01 DIAGNOSIS — E119 Type 2 diabetes mellitus without complications: Secondary | ICD-10-CM | POA: Diagnosis not present

## 2012-04-01 DIAGNOSIS — I5030 Unspecified diastolic (congestive) heart failure: Secondary | ICD-10-CM | POA: Diagnosis not present

## 2012-04-01 DIAGNOSIS — M810 Age-related osteoporosis without current pathological fracture: Secondary | ICD-10-CM | POA: Diagnosis not present

## 2012-04-01 DIAGNOSIS — I509 Heart failure, unspecified: Secondary | ICD-10-CM | POA: Diagnosis not present

## 2012-04-01 NOTE — Telephone Encounter (Signed)
The name of Mrs. Koltun test strips are trueresults.

## 2012-04-02 ENCOUNTER — Other Ambulatory Visit: Payer: Self-pay | Admitting: Podiatry

## 2012-04-02 ENCOUNTER — Telehealth: Payer: Self-pay | Admitting: Internal Medicine

## 2012-04-02 ENCOUNTER — Other Ambulatory Visit: Payer: Self-pay

## 2012-04-02 DIAGNOSIS — M79609 Pain in unspecified limb: Secondary | ICD-10-CM | POA: Diagnosis not present

## 2012-04-02 DIAGNOSIS — M86179 Other acute osteomyelitis, unspecified ankle and foot: Secondary | ICD-10-CM | POA: Diagnosis not present

## 2012-04-02 DIAGNOSIS — L97909 Non-pressure chronic ulcer of unspecified part of unspecified lower leg with unspecified severity: Secondary | ICD-10-CM | POA: Diagnosis not present

## 2012-04-02 DIAGNOSIS — L97509 Non-pressure chronic ulcer of other part of unspecified foot with unspecified severity: Secondary | ICD-10-CM | POA: Diagnosis not present

## 2012-04-02 NOTE — Telephone Encounter (Signed)
i cannot find this test strip in EPIC.  Please just call her pharmacy and refill these #100  Use 3 times daily to check sugars.   Uncontrolled diabetes is the diagnosis  11 refills

## 2012-04-03 ENCOUNTER — Encounter: Payer: Self-pay | Admitting: Internal Medicine

## 2012-04-03 DIAGNOSIS — F418 Other specified anxiety disorders: Secondary | ICD-10-CM

## 2012-04-03 HISTORY — DX: Other specified anxiety disorders: F41.8

## 2012-04-03 NOTE — Assessment & Plan Note (Addendum)
With recent admission for respiratory failure secondary to pneumonia and pulmonary edema.  Troponins were negative.  EF was 65% with MR and TR noted.  She is on apprpriate medications.  Lasix was decreased today to once daily with instructions to follow daily weights and increase to 40 mg daily for wt gain of 2 lbs overnight

## 2012-04-03 NOTE — Assessment & Plan Note (Signed)
Her chief symptoms are insomnia and anxiety surroudning her serious health issues.  Trial of lunesta today, samples given.

## 2012-04-03 NOTE — Assessment & Plan Note (Addendum)
Aggravated by steroids.  Her Lantus dose was increased to 30 units twice daily today and Low GI diet discussed.   Return in two weeks for evaluation of blood sugars and consideration go change to 70/30 bid

## 2012-04-03 NOTE — Assessment & Plan Note (Signed)
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 Vickki Muff.

## 2012-04-05 DIAGNOSIS — L84 Corns and callosities: Secondary | ICD-10-CM | POA: Diagnosis not present

## 2012-04-05 DIAGNOSIS — I10 Essential (primary) hypertension: Secondary | ICD-10-CM | POA: Diagnosis not present

## 2012-04-05 DIAGNOSIS — I509 Heart failure, unspecified: Secondary | ICD-10-CM | POA: Diagnosis not present

## 2012-04-05 DIAGNOSIS — E119 Type 2 diabetes mellitus without complications: Secondary | ICD-10-CM | POA: Diagnosis not present

## 2012-04-05 DIAGNOSIS — I5030 Unspecified diastolic (congestive) heart failure: Secondary | ICD-10-CM | POA: Diagnosis not present

## 2012-04-05 DIAGNOSIS — M810 Age-related osteoporosis without current pathological fracture: Secondary | ICD-10-CM | POA: Diagnosis not present

## 2012-04-06 LAB — WOUND CULTURE

## 2012-04-07 ENCOUNTER — Other Ambulatory Visit: Payer: Self-pay

## 2012-04-09 ENCOUNTER — Other Ambulatory Visit: Payer: Self-pay

## 2012-04-09 MED ORDER — GLUCOSE BLOOD VI STRP: ORAL_STRIP | Status: DC

## 2012-04-09 NOTE — Telephone Encounter (Signed)
Test strips for True Results # 100 11 R sent electronic to [facility]

## 2012-04-12 DIAGNOSIS — L84 Corns and callosities: Secondary | ICD-10-CM | POA: Diagnosis not present

## 2012-04-12 DIAGNOSIS — I10 Essential (primary) hypertension: Secondary | ICD-10-CM | POA: Diagnosis not present

## 2012-04-12 DIAGNOSIS — M810 Age-related osteoporosis without current pathological fracture: Secondary | ICD-10-CM | POA: Diagnosis not present

## 2012-04-12 DIAGNOSIS — E119 Type 2 diabetes mellitus without complications: Secondary | ICD-10-CM | POA: Diagnosis not present

## 2012-04-12 DIAGNOSIS — I5030 Unspecified diastolic (congestive) heart failure: Secondary | ICD-10-CM | POA: Diagnosis not present

## 2012-04-12 DIAGNOSIS — I509 Heart failure, unspecified: Secondary | ICD-10-CM | POA: Diagnosis not present

## 2012-04-14 ENCOUNTER — Ambulatory Visit: Payer: Medicare Other | Admitting: Internal Medicine

## 2012-04-14 DIAGNOSIS — M79609 Pain in unspecified limb: Secondary | ICD-10-CM | POA: Diagnosis not present

## 2012-04-14 DIAGNOSIS — M86179 Other acute osteomyelitis, unspecified ankle and foot: Secondary | ICD-10-CM | POA: Diagnosis not present

## 2012-04-15 DIAGNOSIS — E119 Type 2 diabetes mellitus without complications: Secondary | ICD-10-CM | POA: Diagnosis not present

## 2012-04-15 DIAGNOSIS — I509 Heart failure, unspecified: Secondary | ICD-10-CM | POA: Diagnosis not present

## 2012-04-15 DIAGNOSIS — L84 Corns and callosities: Secondary | ICD-10-CM | POA: Diagnosis not present

## 2012-04-15 DIAGNOSIS — I10 Essential (primary) hypertension: Secondary | ICD-10-CM | POA: Diagnosis not present

## 2012-04-15 DIAGNOSIS — I5030 Unspecified diastolic (congestive) heart failure: Secondary | ICD-10-CM | POA: Diagnosis not present

## 2012-04-15 DIAGNOSIS — M810 Age-related osteoporosis without current pathological fracture: Secondary | ICD-10-CM | POA: Diagnosis not present

## 2012-04-16 ENCOUNTER — Encounter: Payer: Self-pay | Admitting: Internal Medicine

## 2012-04-16 ENCOUNTER — Ambulatory Visit (INDEPENDENT_AMBULATORY_CARE_PROVIDER_SITE_OTHER): Payer: Medicare Other | Admitting: Internal Medicine

## 2012-04-16 ENCOUNTER — Telehealth: Payer: Self-pay | Admitting: Internal Medicine

## 2012-04-16 VITALS — BP 118/56 | HR 67 | Temp 98.5°F | Resp 12 | Ht 66.5 in | Wt 180.0 lb

## 2012-04-16 DIAGNOSIS — I798 Other disorders of arteries, arterioles and capillaries in diseases classified elsewhere: Secondary | ICD-10-CM

## 2012-04-16 DIAGNOSIS — E1165 Type 2 diabetes mellitus with hyperglycemia: Secondary | ICD-10-CM

## 2012-04-16 DIAGNOSIS — E1351 Other specified diabetes mellitus with diabetic peripheral angiopathy without gangrene: Secondary | ICD-10-CM | POA: Diagnosis not present

## 2012-04-16 MED ORDER — ENALAPRIL MALEATE 20 MG PO TABS: 20.0000 mg | ORAL_TABLET | Freq: Every day | ORAL | Status: DC

## 2012-04-16 MED ORDER — INSULIN ASPART PROT & ASPART (70-30 MIX) 100 UNIT/ML ~~LOC~~ SUSP: SUBCUTANEOUS | Status: DC

## 2012-04-16 MED ORDER — METOPROLOL TARTRATE 25 MG PO TABS: 25.0000 mg | ORAL_TABLET | Freq: Two times a day (BID) | ORAL | Status: DC

## 2012-04-16 MED ORDER — AMLODIPINE BESYLATE 10 MG PO TABS: 10.0000 mg | ORAL_TABLET | Freq: Every day | ORAL | Status: DC

## 2012-04-16 MED ORDER — FUROSEMIDE 20 MG PO TABS: 20.0000 mg | ORAL_TABLET | Freq: Every day | ORAL | Status: DC

## 2012-04-16 NOTE — Telephone Encounter (Signed)
Hospital was wondering if you fill out the Attending Physicians statement ??? It is for the pt's insurance ??? Please call Anderson Malta at [place] (She has the form with her ) She can fax it to [place] if we need to.

## 2012-04-16 NOTE — Telephone Encounter (Signed)
Spoke to [employer] asked her to fax form over to ensure that it gets filled out .

## 2012-04-16 NOTE — Progress Notes (Signed)
Patient ID: Pamela Collier, female   DOB: [birth year], 71 y.o.   MRN: [id number]    Patient Active Problem List  Diagnosis  . Diabetes mellitus type 2, uncontrolled  . Double vessel coronary artery disease  . Cardiomyopathy, ischemic  . Hepatic steatosis  . Osteoporosis, post-menopausal  . 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 Atherosclerotic peripheral vascular disease with gangrene  . 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 Depression with anxiety    Subjective:  CC:   Chief Complaint  Patient presents with  . Follow-up    HPI:   Pamela Collier a 71 y.o. female who presents for  2 week follow up for uncontrolled DM. Her blood sugars have been elevated since her last hospital discharge due to recent steroid taper .  She feels generally well except for  Had one night of headache and ear pain which has now resolved.   2) DM:  She has been taking Lantus twuce daily 10:30 Pm and 9 am 30 units morning blood sugars have been generally under 160 but her evening sugars have been occasionally over 200 and never under 150    Past Medical History  Diagnosis Date  . Diabetes mellitus   . 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 Cardiomyopathy, ischemic Jan 2013    EF 35 to 40% [facility])  . Hepatic steatosis     by CT abd pelvis  . Osteoporosis, post-menopausal   . 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 Atherosclerotic peripheral vascular disease with gangrene august 2012    Past Surgical History  Procedure Date  . Abdominal hysterectomy     at ge 39. secondary to bleeding  . 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 Ptca August 2012    Right Posterior tibial artery , Dew         The following portions of the patient's history were reviewed and updated as appropriate: Allergies, current medications, and problem  list.    Review of Systems:   12 Pt  review of systems was negative except those addressed in the HPI,     History   Social History  . Marital Status: Married    Spouse Name: N/A    Number of Children: N/A  . Years of Education: N/A   Occupational History  . Not on file.   Social History Main Topics  . Smoking status: Never Smoker   . Smokeless tobacco: Never Used  . Alcohol Use: No  . Drug Use: No  . Sexually Active: Not on file   Other Topics Concern  . Not on file   Social History Narrative  . No narrative on file    Objective:  BP 118/56  Pulse 67  Temp 98.5 F (36.9 C) (Oral)  Resp 12  Ht 5' 6.5" (1.689 m)  Wt 180 lb (81.647 kg)  BMI 28.62 kg/m2  SpO2 99%  General appearance: alert, cooperative and appears stated age Ears: normal TM's and external ear canals both ears Throat: lips, mucosa, and tongue normal; teeth and gums normal Neck: no adenopathy, no carotid bruit, supple, symmetrical, trachea midline and thyroid not enlarged, symmetric, no tenderness/mass/nodules Back: symmetric, no curvature. ROM normal. No CVA tenderness. Lungs: clear to auscultation bilaterally Heart: regular rate and rhythm, S1, S2 normal, no  murmur, click, rub or gallop Abdomen: soft, non-tender; bowel sounds normal; no masses,  no organomegaly Pulses: 2+ and symmetric Skin: Skin color, texture, turgor normal. No rashes or lesions Lymph nodes: Cervical, supraclavicular, and axillary nodes normal.  Assessment and Plan:  Diabetes mellitus type 2, uncontrolled We discussed changing her Lantus to 70/30 insulin.  Instructions given to take twice daily before breakfast 24 units,  And before supper 30 units. She was advised to continue twice daily accucheks and return in 3 weeks.   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 Vickki Muff for osteomyelitis   Updated Medication List Outpatient Encounter Prescriptions as of 04/16/2012  Medication Sig Dispense Refill  . acetaminophen (TYLENOL) 325 MG tablet Take 650 mg by mouth every 4 (four) hours as needed.      [name] amLODipine (NORVASC) 10 MG tablet Take 1 tablet (10 mg total) by mouth daily.  90 tablet  3  . enalapril (VASOTEC) 20 MG tablet Take 1 tablet (20 mg total) by mouth daily.  90 tablet  3  . eszopiclone (LUNESTA) 2 MG TABS Take 1 tablet (2 mg total) by mouth at bedtime. Take immediately before bedtime  7 tablet  0  . furosemide (LASIX) 20 MG tablet Take 1 tablet (20 mg total) by mouth daily.  90 tablet  3  . glucose blood test strip Check blood sugar 3 times a day DX CODE: 250.0  100 each  11  . ibandronate (BONIVA) 150 MG tablet Take 1 tablet (150 mg total) by mouth every 30 (thirty) days. Take in the morning with a full glass of water, on an empty stomach, and do not take anything else by mouth or lie down for the next 30 min.  3 tablet  3  . insulin glargine (LANTUS) 100 UNIT/ML injection Inject 30 Units into the skin 2 (two) times daily. 24 at units at bedtime.  10 mL  5  . Ipratropium-Albuterol (COMBIVENT RESPIMAT) 20-100 MCG/ACT AERS respimat Inhale 1 puff into the lungs every 6 (six) hours.      . metFORMIN (GLUCOPHAGE) 1000 MG tablet Take 1 tablet (1,000 mg total) by mouth 2 (two) times daily with a meal.  60 tablet  3  . metoprolol tartrate (LOPRESSOR) 25 MG tablet Take 1 tablet (25 mg total) by mouth 2 (two) times daily.  180 tablet  3  . Polyethylene Glycol 3350 (MIRALAX PO) Take by mouth.      . pregabalin (LYRICA) 100 MG capsule Take 1 capsule (100 mg total) by mouth 3 (three) times daily.  90 capsule  3  . rosuvastatin (CRESTOR) 10 MG tablet Take 1 tablet (10 mg total) by mouth daily.  90 tablet  3  . [DISCONTINUED] amLODipine (NORVASC) 10 MG tablet Take 1 tablet (10 mg total) by mouth daily.  30  tablet  3  . [DISCONTINUED] enalapril (VASOTEC) 20 MG tablet Take 1 tablet (20 mg total) by mouth daily.  30 tablet  3  . [DISCONTINUED] furosemide (LASIX) 20 MG tablet Take 1 tablet (20 mg total) by mouth daily.  30 tablet  3  . [DISCONTINUED] metoprolol tartrate (LOPRESSOR) 25 MG tablet Take 1 tablet (25 mg total) by mouth 2 (two) times daily.  60 tablet  3  . aspirin 81 MG EC tablet Take 1 tablet (81 mg total) by  mouth daily. Swallow whole.  30 tablet  12  . insulin aspart protamine-insulin aspart (NOVOLOG MIX 70/30 FLEXPEN) (70-30) 100 UNIT/ML injection Please fill using the pens.  30 units before dinner,  24 units before breakfast  60 syinges to fit pens per monthy  15 mL  12   Facility-Administered Encounter Medications as of 04/16/2012  Medication Dose Route Frequency Provider Last Rate Last Dose  . albuterol (PROVENTIL) (5 MG/ML) 0.5% nebulizer solution 2.5 mg  2.5 mg Nebulization Once [name], MD         Orders Placed This Encounter  Procedures  . Lipid panel  . Microalbumin / creatinine urine ratio  . CBC with Differential  . Comprehensive metabolic panel  . Hemoglobin A1c    No Follow-up on file.

## 2012-04-16 NOTE — Telephone Encounter (Signed)
Form is in [employer]

## 2012-04-16 NOTE — Patient Instructions (Addendum)
We are going to change your insulin to 70/30 to take twice daily before breakfast, and before dinner.  Stop the lantus when you finish your current supply.  Start the new insulin at 30 units before dinner and 24 units before breakfast. .  Keep using the sandwich thins as your bread  You can have a sweet potato whenever you want. They are better for you than white potatoes.   Only one starch per meal.       Return on Monday to get your insurance forms  Return in 3 r 4 weeks to check your insulin doses ,  Bring your blood sugars

## 2012-04-18 ENCOUNTER — Encounter: Payer: Self-pay | Admitting: Internal Medicine

## 2012-04-18 DIAGNOSIS — I5031 Acute diastolic (congestive) heart failure: Secondary | ICD-10-CM | POA: Insufficient documentation

## 2012-04-18 NOTE — Assessment & Plan Note (Addendum)
We discussed changing her Lantus to 70/30 insulin.  Instructions given to take twice daily before breakfast 24 units,  And before supper 30 units. She was advised to continue twice daily accucheks and return in 3 weeks.

## 2012-04-18 NOTE — Assessment & Plan Note (Signed)
Recent arterial evaluation by Pamela Collier revealed noncompressible arteries but adequate flow to heal the anticipated surgical debridement vs amputation of great toe on the left planned by Dr. Vickki Muff for osteomyelitis

## 2012-04-20 DIAGNOSIS — L97509 Non-pressure chronic ulcer of other part of unspecified foot with unspecified severity: Secondary | ICD-10-CM | POA: Diagnosis not present

## 2012-04-20 DIAGNOSIS — I739 Peripheral vascular disease, unspecified: Secondary | ICD-10-CM | POA: Diagnosis not present

## 2012-04-20 DIAGNOSIS — M86179 Other acute osteomyelitis, unspecified ankle and foot: Secondary | ICD-10-CM | POA: Diagnosis not present

## 2012-04-20 DIAGNOSIS — B351 Tinea unguium: Secondary | ICD-10-CM | POA: Diagnosis not present

## 2012-04-20 DIAGNOSIS — S98139A Complete traumatic amputation of one unspecified lesser toe, initial encounter: Secondary | ICD-10-CM | POA: Diagnosis not present

## 2012-04-22 DIAGNOSIS — I5030 Unspecified diastolic (congestive) heart failure: Secondary | ICD-10-CM | POA: Diagnosis not present

## 2012-04-22 DIAGNOSIS — I10 Essential (primary) hypertension: Secondary | ICD-10-CM | POA: Diagnosis not present

## 2012-04-22 DIAGNOSIS — L84 Corns and callosities: Secondary | ICD-10-CM | POA: Diagnosis not present

## 2012-04-22 DIAGNOSIS — M810 Age-related osteoporosis without current pathological fracture: Secondary | ICD-10-CM | POA: Diagnosis not present

## 2012-04-22 DIAGNOSIS — I509 Heart failure, unspecified: Secondary | ICD-10-CM | POA: Diagnosis not present

## 2012-04-22 DIAGNOSIS — E119 Type 2 diabetes mellitus without complications: Secondary | ICD-10-CM | POA: Diagnosis not present

## 2012-04-25 IMAGING — CR DG LUMBAR SPINE 2-3V
1 series · 3 of 3 positions shown · non-contrast
Comparison: none

REASON FOR EXAM: pain lower pain for one week     Flex 2
COMMENTS:   LMP: Post Hysterectomy

[Series 1: view not recorded · 0.17mm/px · 3 of 3 slices shown]
[im 1/3]
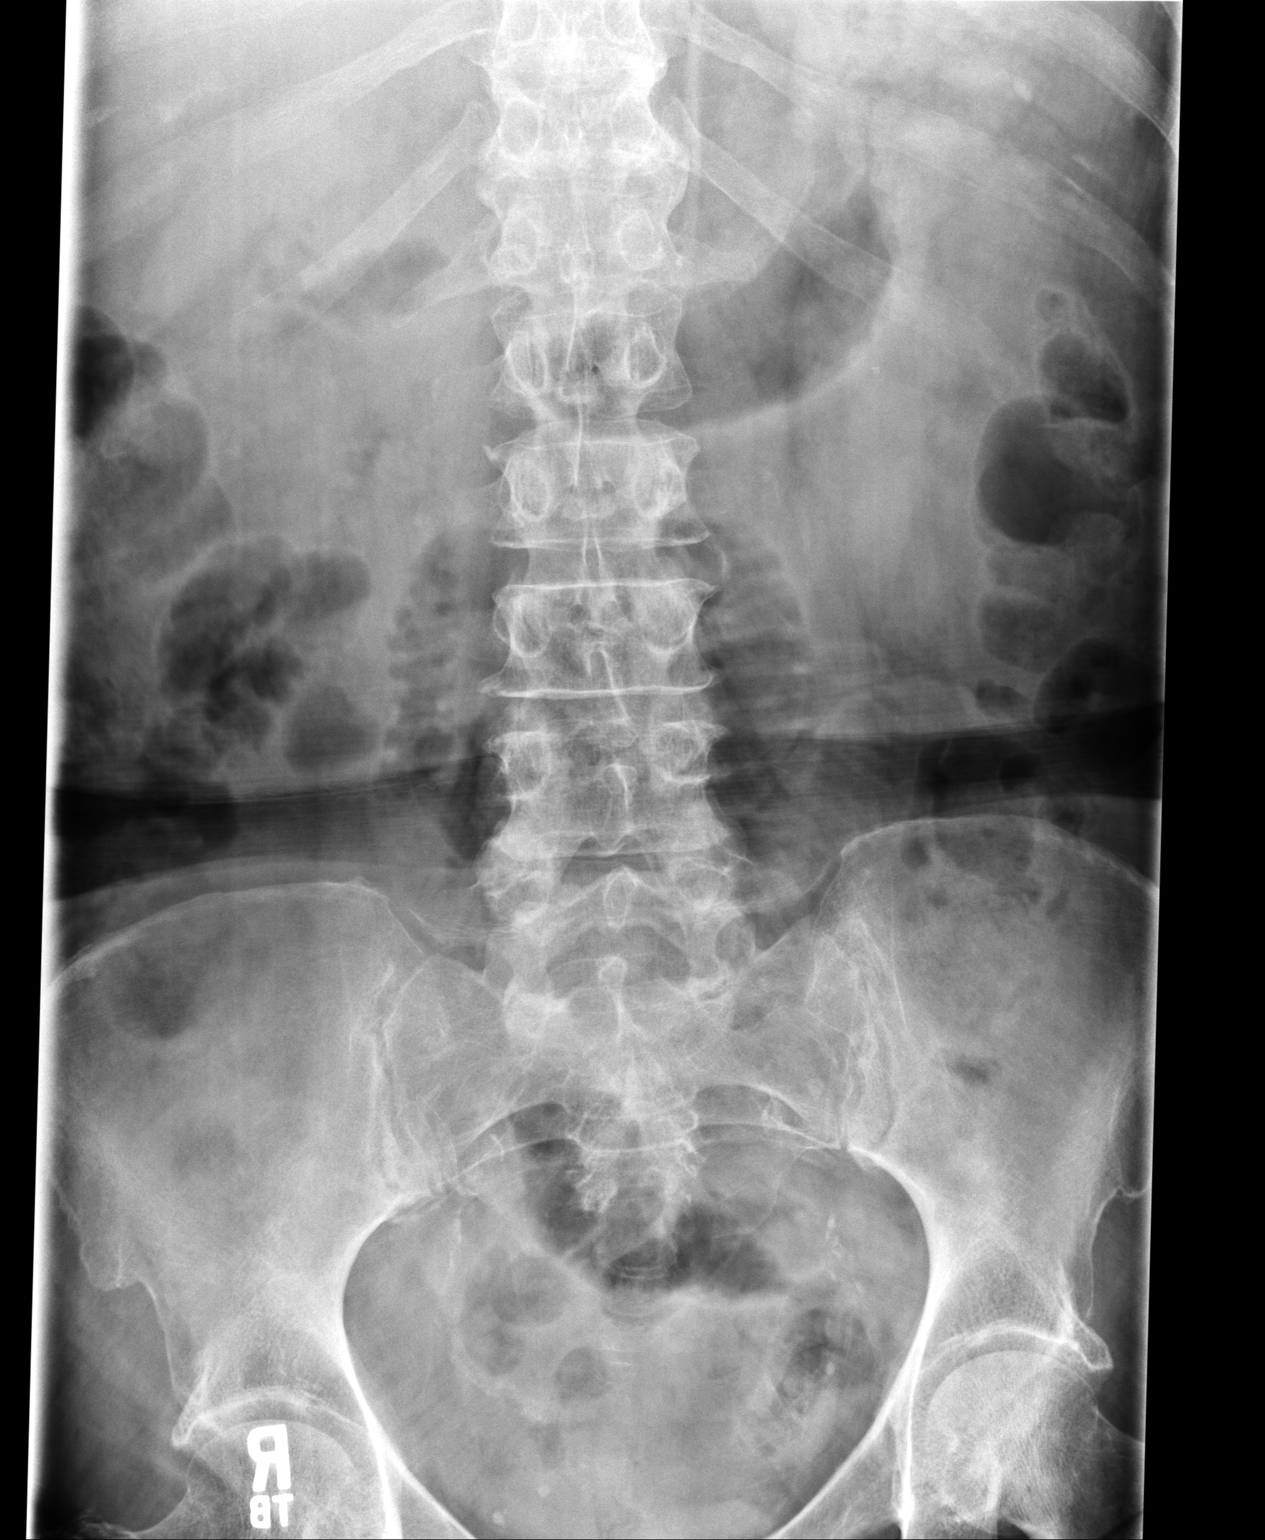
[im 2/3]
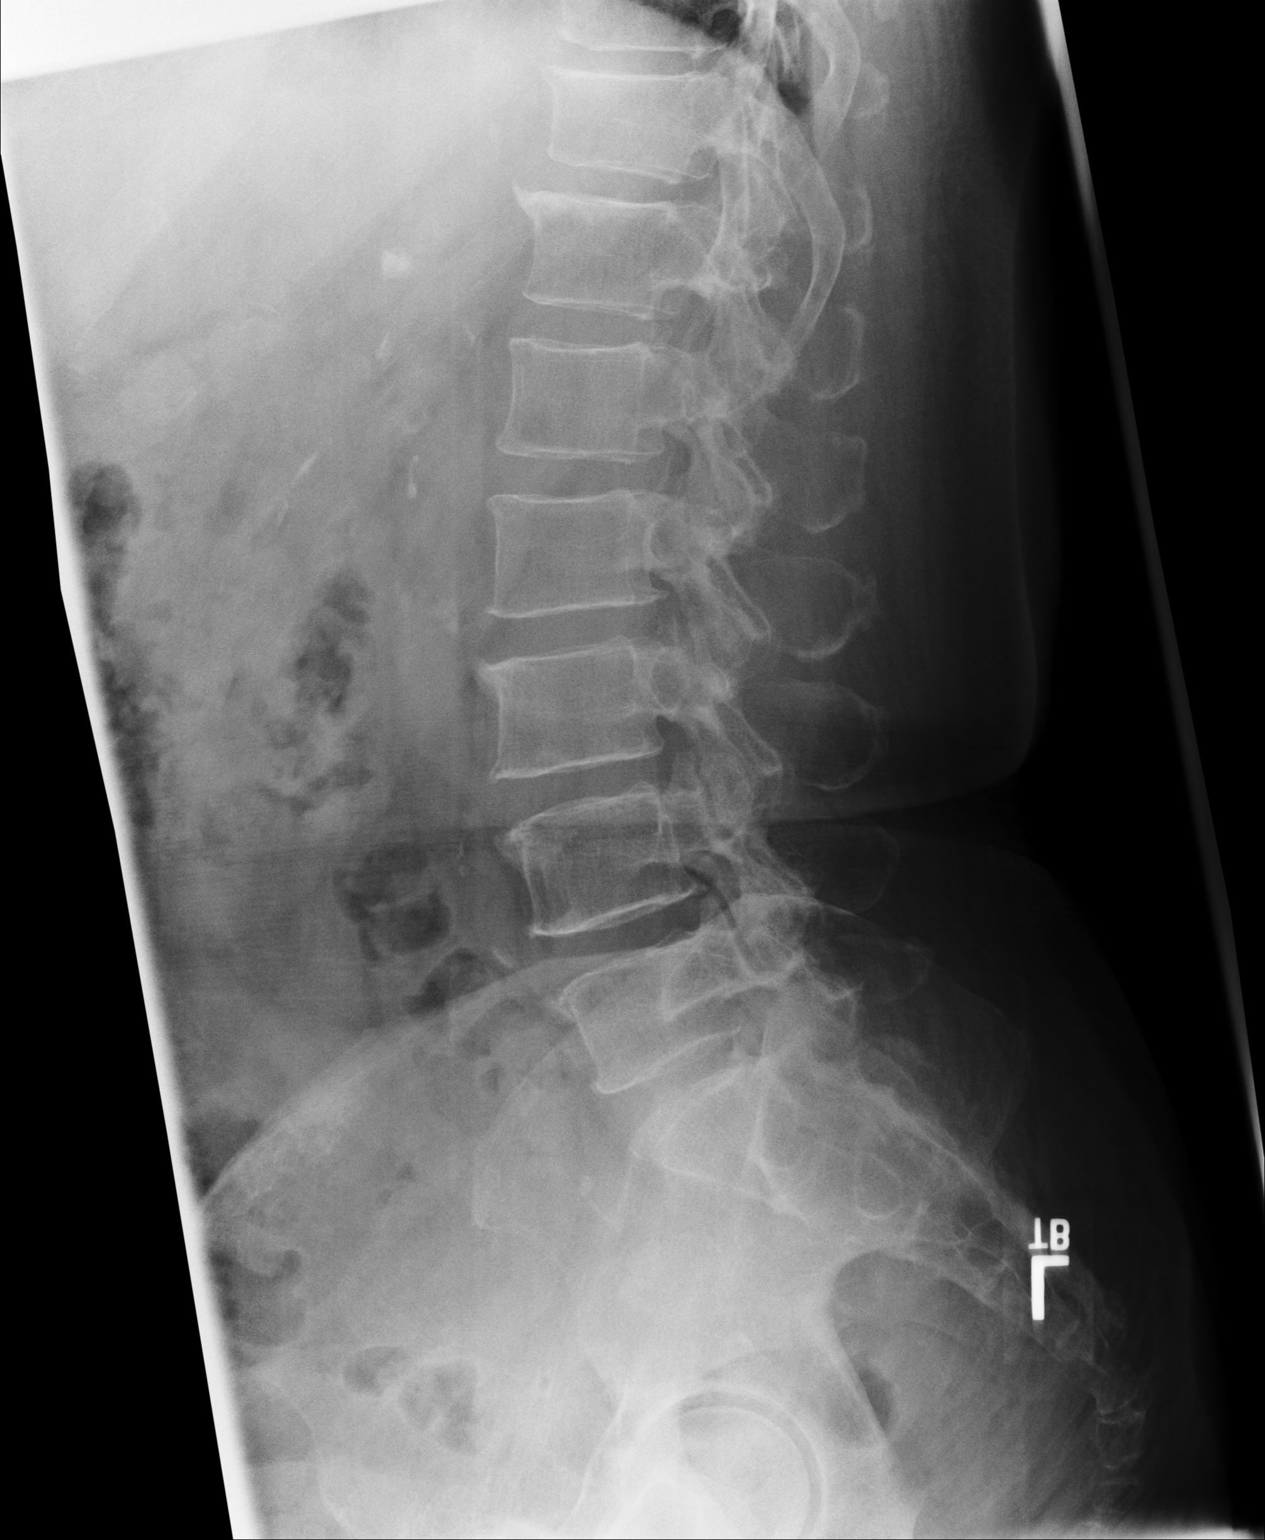
[im 3/3]
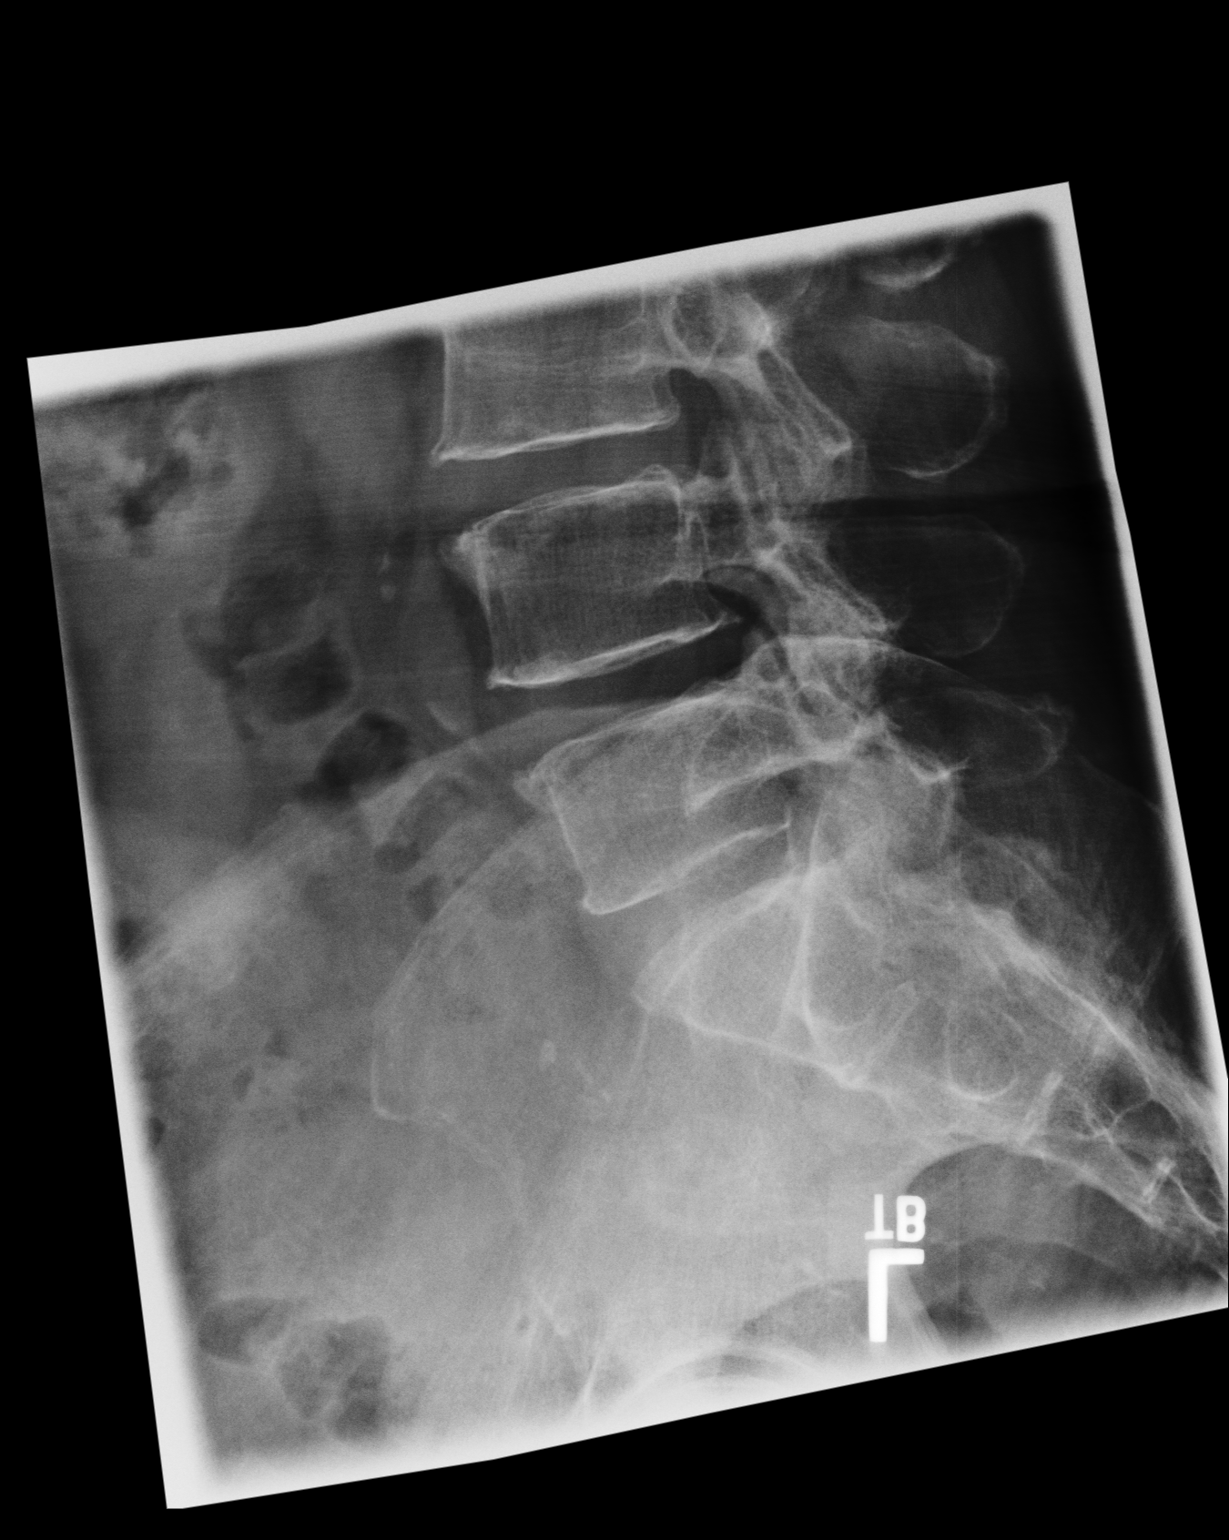

[3 of 3 positions shown; findings below may reference images not displayed]

PROCEDURE:     DXR - DXR LUMBAR SPINE AP AND LATERAL  - January 01, 2010 [DATE]

RESULT:     Comparison is made to the study of 06/08/2007.

There is no compression deformity, subluxation or disc space narrowing.
There does not appear to be significant change. Atherosclerotic
calcification is present. No fracture is demonstrated.
IMPRESSION: Mild degenerative disease in the spine and sacroiliac
joints. No significant change or acute abnormality.

## 2012-04-27 DIAGNOSIS — L97509 Non-pressure chronic ulcer of other part of unspecified foot with unspecified severity: Secondary | ICD-10-CM | POA: Diagnosis not present

## 2012-04-27 DIAGNOSIS — S98139A Complete traumatic amputation of one unspecified lesser toe, initial encounter: Secondary | ICD-10-CM | POA: Diagnosis not present

## 2012-04-27 DIAGNOSIS — M86179 Other acute osteomyelitis, unspecified ankle and foot: Secondary | ICD-10-CM | POA: Diagnosis not present

## 2012-04-27 DIAGNOSIS — I739 Peripheral vascular disease, unspecified: Secondary | ICD-10-CM | POA: Diagnosis not present

## 2012-04-27 DIAGNOSIS — B351 Tinea unguium: Secondary | ICD-10-CM | POA: Diagnosis not present

## 2012-04-29 DIAGNOSIS — I509 Heart failure, unspecified: Secondary | ICD-10-CM | POA: Diagnosis not present

## 2012-04-29 DIAGNOSIS — L84 Corns and callosities: Secondary | ICD-10-CM | POA: Diagnosis not present

## 2012-04-29 DIAGNOSIS — I5030 Unspecified diastolic (congestive) heart failure: Secondary | ICD-10-CM | POA: Diagnosis not present

## 2012-04-29 DIAGNOSIS — I10 Essential (primary) hypertension: Secondary | ICD-10-CM | POA: Diagnosis not present

## 2012-04-29 DIAGNOSIS — M810 Age-related osteoporosis without current pathological fracture: Secondary | ICD-10-CM | POA: Diagnosis not present

## 2012-04-29 DIAGNOSIS — E119 Type 2 diabetes mellitus without complications: Secondary | ICD-10-CM | POA: Diagnosis not present

## 2012-05-06 ENCOUNTER — Observation Stay: Payer: Self-pay | Admitting: Internal Medicine

## 2012-05-06 DIAGNOSIS — M542 Cervicalgia: Secondary | ICD-10-CM | POA: Diagnosis not present

## 2012-05-06 DIAGNOSIS — Z794 Long term (current) use of insulin: Secondary | ICD-10-CM | POA: Diagnosis not present

## 2012-05-06 DIAGNOSIS — M810 Age-related osteoporosis without current pathological fracture: Secondary | ICD-10-CM | POA: Diagnosis not present

## 2012-05-06 DIAGNOSIS — R0602 Shortness of breath: Secondary | ICD-10-CM | POA: Diagnosis not present

## 2012-05-06 DIAGNOSIS — R079 Chest pain, unspecified: Secondary | ICD-10-CM | POA: Diagnosis not present

## 2012-05-06 DIAGNOSIS — M47812 Spondylosis without myelopathy or radiculopathy, cervical region: Secondary | ICD-10-CM | POA: Diagnosis not present

## 2012-05-06 DIAGNOSIS — M79609 Pain in unspecified limb: Secondary | ICD-10-CM | POA: Diagnosis not present

## 2012-05-06 DIAGNOSIS — E119 Type 2 diabetes mellitus without complications: Secondary | ICD-10-CM | POA: Diagnosis not present

## 2012-05-06 DIAGNOSIS — M129 Arthropathy, unspecified: Secondary | ICD-10-CM | POA: Diagnosis not present

## 2012-05-06 DIAGNOSIS — R52 Pain, unspecified: Secondary | ICD-10-CM | POA: Diagnosis not present

## 2012-05-06 DIAGNOSIS — M5030 Other cervical disc degeneration, unspecified cervical region: Secondary | ICD-10-CM | POA: Diagnosis not present

## 2012-05-06 DIAGNOSIS — I10 Essential (primary) hypertension: Secondary | ICD-10-CM | POA: Diagnosis not present

## 2012-05-06 DIAGNOSIS — I509 Heart failure, unspecified: Secondary | ICD-10-CM | POA: Diagnosis not present

## 2012-05-06 DIAGNOSIS — M7989 Other specified soft tissue disorders: Secondary | ICD-10-CM | POA: Diagnosis not present

## 2012-05-06 DIAGNOSIS — I209 Angina pectoris, unspecified: Secondary | ICD-10-CM | POA: Diagnosis not present

## 2012-05-06 DIAGNOSIS — Z79899 Other long term (current) drug therapy: Secondary | ICD-10-CM | POA: Diagnosis not present

## 2012-05-06 DIAGNOSIS — I2510 Atherosclerotic heart disease of native coronary artery without angina pectoris: Secondary | ICD-10-CM | POA: Diagnosis not present

## 2012-05-06 DIAGNOSIS — Z7982 Long term (current) use of aspirin: Secondary | ICD-10-CM | POA: Diagnosis not present

## 2012-05-06 DIAGNOSIS — I5022 Chronic systolic (congestive) heart failure: Secondary | ICD-10-CM | POA: Diagnosis not present

## 2012-05-06 LAB — CBC
HCT: 33.7 % — ABNORMAL LOW (ref 35.0–47.0)
HGB: 11.7 g/dL — ABNORMAL LOW (ref 12.0–16.0)
MCH: 30.4 pg (ref 26.0–34.0)
MCHC: 34.7 g/dL (ref 32.0–36.0)
Platelet: 294 10*3/uL (ref 150–440)
RBC: 3.84 10*6/uL (ref 3.80–5.20)
RDW: 14.5 % (ref 11.5–14.5)

## 2012-05-06 LAB — COMPREHENSIVE METABOLIC PANEL
Albumin: 3.3 g/dL — ABNORMAL LOW (ref 3.4–5.0)
Alkaline Phosphatase: 81 U/L (ref 50–136)
Anion Gap: 8 (ref 7–16)
Bilirubin,Total: 0.5 mg/dL (ref 0.2–1.0)
Chloride: 107 mmol/L (ref 98–107)
Glucose: 175 mg/dL — ABNORMAL HIGH (ref 65–99)
Osmolality: 288 (ref 275–301)
Potassium: 4 mmol/L (ref 3.5–5.1)
SGOT(AST): 16 U/L (ref 15–37)
Sodium: 141 mmol/L (ref 136–145)
Total Protein: 7.2 g/dL (ref 6.4–8.2)

## 2012-05-06 LAB — CK TOTAL AND CKMB (NOT AT ARMC)
CK, Total: 227 U/L — ABNORMAL HIGH (ref 21–215)
CK, Total: 157 U/L (ref 21–215)
CK, Total: 122 U/L (ref 21–215)
CK-MB: 2.1 ng/mL (ref 0.5–3.6)
CK-MB: 1.6 ng/mL (ref 0.5–3.6)

## 2012-05-06 LAB — TROPONIN I
Troponin-I: 0.02 ng/mL
Troponin-I: 0.02 ng/mL

## 2012-05-07 ENCOUNTER — Telehealth: Payer: Self-pay | Admitting: Internal Medicine

## 2012-05-07 DIAGNOSIS — M542 Cervicalgia: Secondary | ICD-10-CM | POA: Diagnosis not present

## 2012-05-07 DIAGNOSIS — I10 Essential (primary) hypertension: Secondary | ICD-10-CM | POA: Diagnosis not present

## 2012-05-07 DIAGNOSIS — I2510 Atherosclerotic heart disease of native coronary artery without angina pectoris: Secondary | ICD-10-CM | POA: Diagnosis not present

## 2012-05-07 DIAGNOSIS — R079 Chest pain, unspecified: Secondary | ICD-10-CM | POA: Diagnosis not present

## 2012-05-07 NOTE — Telephone Encounter (Signed)
Hospital follow 05/20/11 with dr Derrel Nip  Pt discharged 12/27

## 2012-05-07 NOTE — Telephone Encounter (Signed)
PT was discharged from the hospital due to chest pain. Pt stated hospital advised her that it could just be arthritis pain. Pt has a follow up appt with Dr. Derrel Nip on 05/20/11.

## 2012-05-08 DIAGNOSIS — I509 Heart failure, unspecified: Secondary | ICD-10-CM | POA: Diagnosis not present

## 2012-05-08 DIAGNOSIS — I10 Essential (primary) hypertension: Secondary | ICD-10-CM | POA: Diagnosis not present

## 2012-05-08 DIAGNOSIS — M810 Age-related osteoporosis without current pathological fracture: Secondary | ICD-10-CM | POA: Diagnosis not present

## 2012-05-08 DIAGNOSIS — E119 Type 2 diabetes mellitus without complications: Secondary | ICD-10-CM | POA: Diagnosis not present

## 2012-05-08 DIAGNOSIS — I5030 Unspecified diastolic (congestive) heart failure: Secondary | ICD-10-CM | POA: Diagnosis not present

## 2012-05-08 DIAGNOSIS — L84 Corns and callosities: Secondary | ICD-10-CM | POA: Diagnosis not present

## 2012-05-10 ENCOUNTER — Telehealth: Payer: Self-pay | Admitting: General Practice

## 2012-05-10 ENCOUNTER — Ambulatory Visit: Payer: Medicare Other | Admitting: Internal Medicine

## 2012-05-10 DIAGNOSIS — I509 Heart failure, unspecified: Secondary | ICD-10-CM | POA: Diagnosis not present

## 2012-05-10 DIAGNOSIS — E119 Type 2 diabetes mellitus without complications: Secondary | ICD-10-CM | POA: Diagnosis not present

## 2012-05-10 DIAGNOSIS — I10 Essential (primary) hypertension: Secondary | ICD-10-CM | POA: Diagnosis not present

## 2012-05-10 DIAGNOSIS — L84 Corns and callosities: Secondary | ICD-10-CM | POA: Diagnosis not present

## 2012-05-10 DIAGNOSIS — I5030 Unspecified diastolic (congestive) heart failure: Secondary | ICD-10-CM | POA: Diagnosis not present

## 2012-05-10 DIAGNOSIS — M810 Age-related osteoporosis without current pathological fracture: Secondary | ICD-10-CM | POA: Diagnosis not present

## 2012-05-10 NOTE — Telephone Encounter (Signed)
Pt advised she had been hospitalized due to neck,side, and leg pain. Pt advised that she also had SOB.Pt has follow up appt with Dr. Derrel Nip at 11:00 today.

## 2012-05-11 DIAGNOSIS — L97509 Non-pressure chronic ulcer of other part of unspecified foot with unspecified severity: Secondary | ICD-10-CM | POA: Diagnosis not present

## 2012-05-19 ENCOUNTER — Ambulatory Visit (INDEPENDENT_AMBULATORY_CARE_PROVIDER_SITE_OTHER): Payer: Medicare Other | Admitting: Internal Medicine

## 2012-05-19 ENCOUNTER — Encounter: Payer: Self-pay | Admitting: Internal Medicine

## 2012-05-19 VITALS — BP 118/64 | HR 66 | Temp 97.8°F | Resp 15 | Wt 184.8 lb

## 2012-05-19 DIAGNOSIS — R911 Solitary pulmonary nodule: Secondary | ICD-10-CM

## 2012-05-19 DIAGNOSIS — E1165 Type 2 diabetes mellitus with hyperglycemia: Secondary | ICD-10-CM

## 2012-05-19 DIAGNOSIS — R0789 Other chest pain: Secondary | ICD-10-CM

## 2012-05-19 DIAGNOSIS — I2510 Atherosclerotic heart disease of native coronary artery without angina pectoris: Secondary | ICD-10-CM | POA: Diagnosis not present

## 2012-05-19 DIAGNOSIS — I209 Angina pectoris, unspecified: Secondary | ICD-10-CM | POA: Diagnosis not present

## 2012-05-19 DIAGNOSIS — M810 Age-related osteoporosis without current pathological fracture: Secondary | ICD-10-CM

## 2012-05-19 MED ORDER — IBANDRONATE SODIUM 150 MG PO TABS: 150.0000 mg | ORAL_TABLET | ORAL | Status: DC

## 2012-05-19 MED ORDER — INSULIN ASPART PROT & ASPART (70-30 MIX) 100 UNIT/ML ~~LOC~~ SUSP: SUBCUTANEOUS | Status: DC

## 2012-05-19 NOTE — Patient Instructions (Addendum)
We will check on your coverage for Prolia, the twice yearly shot for osteoporosis  Increase your morning insulin dose form 24 units to 27 units .  This should improve your pre supper sugars   Continue 30 units before supper

## 2012-05-19 NOTE — Progress Notes (Signed)
Patient ID: Pamela Collier, female   DOB: 20-Jan-1941, 72 y.o.   MRN: [id number]  Patient Active Problem List  Diagnosis  . Diabetes mellitus type 2, uncontrolled  . Double vessel coronary artery disease  . Cardiomyopathy, ischemic  . Hepatic steatosis  . Osteoporosis, post-menopausal  . 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 Atherosclerotic peripheral vascular disease with gangrene  . 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 Depression with anxiety  . Heart failure, diastolic, acute  . Pulmonary nodule seen on imaging study    Subjective:  CC:   Chief Complaint  Patient presents with  . Follow-up    Hospital    HPI:   Pamela Collier a 72 y.o. female who presents Hospital follow up Dec 27 th for a one day admission for chest pain. She underwent a contrasted chest CT to rule out pulmonary embolus. She ruled out for PE and acute MI, but a  right upper lobe nodule incidental was found with scattered lymphadenopathy noted in both lungs and upper abdomen. She was seen by Dr. Towanda Malkin both in-house and at followup.     Past Medical History  Diagnosis Date  . Diabetes mellitus   . 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 Cardiomyopathy, ischemic Jan 2013    EF 35 to 40% [facility])  . Hepatic steatosis     by CT abd pelvis  . Osteoporosis, post-menopausal   . 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 Atherosclerotic peripheral vascular disease with gangrene august 2012    Past Surgical History  Procedure Date  . Abdominal hysterectomy     at ge 39. secondary to bleeding  . 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 Ptca August 2012    Right Posterior tibial artery , [place]         The following portions of the patient's history were reviewed and updated as appropriate: Allergies, current medications, and problem  list.    Review of Systems:   Patient denies headache, fevers, malaise, unintentional weight loss, skin rash, eye pain, sinus congestion and sinus pain, sore throat, dysphagia,  hemoptysis , cough, dyspnea, wheezing, chest pain, palpitations, orthopnea, edema, abdominal pain, nausea, melena, diarrhea, constipation, flank pain, dysuria, hematuria, urinary  Frequency, nocturia, numbness, tingling, seizures,  Focal weakness, Loss of consciousness,  Tremor,  and suicidal ideation.     History   Social History  . Marital Status: Married    Spouse Name: N/A    Number of Children: N/A  . Years of Education: N/A   Occupational History  . Not on file.   Social History Main Topics  . Smoking status: Never Smoker   . Smokeless tobacco: Never Used  . Alcohol Use: No  . Drug Use: No  . Sexually Active: Yes   Other Topics Concern  . Not on file   Social History Narrative  . No narrative on file    Objective:  BP 118/64  Pulse 66  Temp 97.8 F (36.6 C) (Oral)  Resp 15  Wt 184 lb 12 oz (83.802 kg)  SpO2 94%  General appearance: alert, cooperative and appears stated age Ears: normal TM's and external ear canals both ears Throat: lips, mucosa, and tongue normal; teeth and gums normal Neck: no adenopathy, no carotid bruit, supple, symmetrical, trachea  midline and thyroid not enlarged, symmetric, no tenderness/mass/nodules Back: symmetric, no curvature. ROM normal. No CVA tenderness. Lungs: clear to auscultation bilaterally Heart: regular rate and rhythm, S1, S2 normal, no murmur, click, rub or gallop Abdomen: soft, non-tender; bowel sounds normal; no masses,  no organomegaly Pulses: 2+ and symmetric Skin: Skin color, texture, turgor normal. No rashes or lesions Lymph nodes: Cervical, supraclavicular, and axillary nodes normal.  Assessment and Plan:  Pulmonary nodule seen on imaging study She has no history of tobacco abuse other than passive exposure from her husband. She was  hospitalized in November for acute respiratory failure secondary to pneumonia. She has multiple calcified hilar nodules raising the possibility of granulomatous disease Given the lymphadenopathy noted on chest CT and abdominal CT I am referring her to Dr. Lake Bells for evaluation.  Diabetes mellitus type 2, uncontrolled Her blood sugars are improving but not at goal. Her blood sugars prior to dinner are elevated. We are increasing her morning dose of 70/30 insulin by 3 units to 27 units and have her continued 30 units at suppertime.  Osteoporosis, post-menopausal She is a change in therapy for Boniva to twice annually Prolia.  It is unclear whether her insurance will pay for this. I assured her to continue but Boniva for now until we can find out. We will need to order a repeat DEXA scan and 2 there is not on file to prove that she has osteoporosis  Chest pain radiating to arm [facility] admission December 27. Ruled out for cardiac causes per Cardiology eval by [facility]. . CT negative for PE. I Etiology of pain appears to be chronic angina and radiculopathy from cervical disc disease. Continue Lyrica. I see no need to add Neurontin at this time    Updated Medication List Outpatient Encounter Prescriptions as of 05/19/2012  Medication Sig Dispense Refill  . acetaminophen (TYLENOL) 325 MG tablet Take 650 mg by mouth every 4 (four) hours as needed.      [name] amLODipine (NORVASC) 10 MG tablet Take 1 tablet (10 mg total) by mouth daily.  90 tablet  3  . aspirin 81 MG EC tablet Take 1 tablet (81 mg total) by mouth daily. Swallow whole.  30 tablet  12  . enalapril (VASOTEC) 20 MG tablet Take 1 tablet (20 mg total) by mouth daily.  90 tablet  3  . eszopiclone (LUNESTA) 2 MG TABS Take 1 tablet (2 mg total) by mouth at bedtime. Take immediately before bedtime  7 tablet  0  . furosemide (LASIX) 20 MG tablet Take 1 tablet (20 mg total) by mouth daily.  90 tablet  3  . glucose blood test strip Check blood sugar 3 times  a day DX CODE: 250.0  100 each  11  . ibandronate (BONIVA) 150 MG tablet Take 1 tablet (150 mg total) by mouth every 30 (thirty) days. Take in the morning with a full glass of water, on an empty stomach, and do not take anything else by mouth or lie down for the next 30 min.  1 tablet  11  . insulin aspart protamine-insulin aspart (NOVOLOG MIX 70/30 FLEXPEN) (70-30) 100 UNIT/ML injection Please fill using the pens.  30 units before dinner,  27 units before breakfast  Needs 18 pens for a 90 day supply   PLEASE ALSO SUPPLY NEEDLES FOR THE FLEXPEN 180/90 DAY  54 mL  3  . Ipratropium-Albuterol (COMBIVENT RESPIMAT) 20-100 MCG/ACT AERS respimat Inhale 1 puff into the lungs every 6 (six) hours.      [name]  metFORMIN (GLUCOPHAGE) 1000 MG tablet Take 1 tablet (1,000 mg total) by mouth 2 (two) times daily with a meal.  60 tablet  3  . metoprolol tartrate (LOPRESSOR) 25 MG tablet Take 1 tablet (25 mg total) by mouth 2 (two) times daily.  180 tablet  3  . pregabalin (LYRICA) 100 MG capsule Take 1 capsule (100 mg total) by mouth 3 (three) times daily.  90 capsule  3  . rosuvastatin (CRESTOR) 10 MG tablet Take 1 tablet (10 mg total) by mouth daily.  90 tablet  3  . traMADol-acetaminophen (ULTRACET) 37.5-325 MG per tablet Take 1 tablet by mouth every 6 (six) hours as needed.      . [DISCONTINUED] ibandronate (BONIVA) 150 MG tablet Take 1 tablet (150 mg total) by mouth every 30 (thirty) days. Take in the morning with a full glass of water, on an empty stomach, and do not take anything else by mouth or lie down for the next 30 min.  3 tablet  3  . [DISCONTINUED] ibandronate (BONIVA) 150 MG tablet Take 1 tablet (150 mg total) by mouth every 30 (thirty) days. Take in the morning with a full glass of water, on an empty stomach, and do not take anything else by mouth or lie down for the next 30 min.  1 tablet  11  . [DISCONTINUED] insulin aspart protamine-insulin aspart (NOVOLOG MIX 70/30 FLEXPEN) (70-30) 100 UNIT/ML injection  Please fill using the pens.  30 units before dinner,  24 units before breakfast  60 syinges to fit pens per monthy  15 mL  12  . insulin glargine (LANTUS) 100 UNIT/ML injection Inject 30 Units into the skin 2 (two) times daily. 24 at units at bedtime.  10 mL  5  . Polyethylene Glycol 3350 (MIRALAX PO) Take by mouth.       Facility-Administered Encounter Medications as of 05/19/2012  Medication Dose Route Frequency Provider Last Rate Last Dose  . albuterol (PROVENTIL) (5 MG/ML) 0.5% nebulizer solution 2.5 mg  2.5 mg Nebulization Once [name], MD

## 2012-05-20 ENCOUNTER — Encounter: Payer: Self-pay | Admitting: Internal Medicine

## 2012-05-20 DIAGNOSIS — E119 Type 2 diabetes mellitus without complications: Secondary | ICD-10-CM | POA: Diagnosis not present

## 2012-05-20 DIAGNOSIS — R0789 Other chest pain: Secondary | ICD-10-CM | POA: Insufficient documentation

## 2012-05-20 DIAGNOSIS — I509 Heart failure, unspecified: Secondary | ICD-10-CM | POA: Diagnosis not present

## 2012-05-20 DIAGNOSIS — M810 Age-related osteoporosis without current pathological fracture: Secondary | ICD-10-CM | POA: Diagnosis not present

## 2012-05-20 DIAGNOSIS — R911 Solitary pulmonary nodule: Secondary | ICD-10-CM | POA: Insufficient documentation

## 2012-05-20 DIAGNOSIS — Z0279 Encounter for issue of other medical certificate: Secondary | ICD-10-CM

## 2012-05-20 DIAGNOSIS — I5030 Unspecified diastolic (congestive) heart failure: Secondary | ICD-10-CM | POA: Diagnosis not present

## 2012-05-20 DIAGNOSIS — I10 Essential (primary) hypertension: Secondary | ICD-10-CM | POA: Diagnosis not present

## 2012-05-20 DIAGNOSIS — L84 Corns and callosities: Secondary | ICD-10-CM | POA: Diagnosis not present

## 2012-05-20 NOTE — Assessment & Plan Note (Signed)
[facility] admission December 27. Ruled out for cardiac causes per Cardiology eval by [facility]. . CT negative for PE. I Etiology of pain appears to be chronic angina and radiculopathy from cervical disc disease. Continue Lyrica. I see no need to add Neurontin at this time

## 2012-05-20 NOTE — Assessment & Plan Note (Addendum)
She is a change in therapy for Boniva to twice annually Prolia.  It is unclear whether her insurance will pay for this. I assured her to continue but Boniva for now until we can find out. We will need to order a repeat DEXA scan and 2 there is not on file to prove that she has osteoporosis

## 2012-05-20 NOTE — Assessment & Plan Note (Signed)
Her blood sugars are improving but not at goal. Her blood sugars prior to dinner are elevated. We are increasing her morning dose of 70/30 insulin by 3 units to 27 units and have her continued 30 units at suppertime.

## 2012-05-20 NOTE — Assessment & Plan Note (Addendum)
She has no history of tobacco abuse other than passive exposure from her husband. She was hospitalized in November for acute respiratory failure secondary to pneumonia. She has multiple calcified hilar nodules raising the possibility of granulomatous disease Given the lymphadenopathy noted on chest CT and abdominal CT I am referring her to Dr. Lake Bells for evaluation.

## 2012-05-21 ENCOUNTER — Telehealth: Payer: Self-pay | Admitting: Internal Medicine

## 2012-05-21 NOTE — Telephone Encounter (Signed)
Called pt left messsage for pt to call office, Please let pt know her forms are ready to be picked up

## 2012-05-24 ENCOUNTER — Encounter: Payer: Self-pay | Admitting: Internal Medicine

## 2012-05-24 ENCOUNTER — Other Ambulatory Visit: Payer: Self-pay | Admitting: General Practice

## 2012-05-24 ENCOUNTER — Ambulatory Visit (INDEPENDENT_AMBULATORY_CARE_PROVIDER_SITE_OTHER): Payer: Medicare Other | Admitting: Internal Medicine

## 2012-05-24 VITALS — BP 112/60 | HR 67 | Temp 98.2°F | Ht 66.0 in | Wt 184.0 lb

## 2012-05-24 DIAGNOSIS — I5031 Acute diastolic (congestive) heart failure: Secondary | ICD-10-CM | POA: Diagnosis not present

## 2012-05-24 DIAGNOSIS — R911 Solitary pulmonary nodule: Secondary | ICD-10-CM | POA: Diagnosis not present

## 2012-05-24 DIAGNOSIS — R0602 Shortness of breath: Secondary | ICD-10-CM | POA: Diagnosis not present

## 2012-05-24 MED ORDER — "INSULIN SYRINGE/NEEDLE 28G X 1/2"" 1 ML MISC": 1.0000 | Status: DC | PRN

## 2012-05-24 MED ORDER — OLMESARTAN MEDOXOMIL 40 MG PO TABS: ORAL_TABLET | ORAL | Status: DC

## 2012-05-24 NOTE — Patient Instructions (Addendum)
Stop enalapril (vasotec) and amlodipine and  Start benicar 40 mg one half daily and your cough and breathing and throat irritation should go away.  Ok to use combivent one puff every 6 hours as needed for breathing   Please schedule a follow up office visit in 4 weeks, sooner if needed with cxr at [facility] office first

## 2012-05-24 NOTE — Telephone Encounter (Signed)
Pt picked up forms 1/13

## 2012-05-24 NOTE — Telephone Encounter (Signed)
Med filled per pt.

## 2012-05-24 NOTE — Progress Notes (Signed)
  Subjective:    Patient ID: Pamela Collier, female    DOB: [birth year]   MRN: [id number]  HPI  25 yobf never smoker with variable sob x 2011 while on ACEI   referred by Dr Derrel Nip  05/24/2012 for evaluation >  Admit Dec 27th 2014 for chest pain [place]. She underwent a contrasted chest CT to rule out pulmonary embolus. She ruled out for PE and acute MI but had pulmonary nodules and nodes.   05/24/2012 1st pulmonary eval cc persistent spells of sob and cough some better at ov with no excess mucus productions assoc with waty rhinorhea waxe and wane x sev years in terms of intensity but present daily. No assoc cp. ? Better with various inhalers  No obvious daytime variabilty or excess mucus produciton or cp or chest tightness, subjective wheeze overt sinus or hb symptoms. No unusual exp hx or h/o childhood pna/ asthma or premature birth to her knowledge.   Sleeping ok without nocturnal  or early am exacerbation  of respiratory  c/o's or need for noct saba. Also denies any obvious fluctuation of symptoms with weather or environmental changes or other aggravating or alleviating factors except as outlined above    Review of Systems  Constitutional: Negative for fever, chills and unexpected weight change.  HENT: Positive for rhinorrhea. Negative for ear pain, nosebleeds, congestion, sore throat, sneezing, trouble swallowing, dental problem, voice change, postnasal drip and sinus pressure.   Eyes: Negative for visual disturbance.  Respiratory: Positive for cough. Negative for choking and shortness of breath.   Cardiovascular: Negative for chest pain and leg swelling.  Gastrointestinal: Negative for vomiting, abdominal pain and diarrhea.  Genitourinary: Negative for difficulty urinating.  Musculoskeletal: Negative for arthralgias.  Skin: Negative for rash.  Neurological: Negative for tremors, syncope and headaches.  Hematological: Does not bruise/bleed easily.       Objective:   Physical  Exam  Pleasant amb bf nad x for freq throat  Wt Readings from Last 3 Encounters:  05/24/12 184 lb (83.462 kg)  05/19/12 184 lb 12 oz (83.802 kg)  04/16/12 180 lb (81.647 kg)    HEENT: nl dentition, turbinates, and orophanx. Nl external ear canals without cough reflex   NECK :  without JVD/Nodes/TM/ nl carotid upstrokes bilaterally   LUNGS: no acc muscle use, clear to A and P bilaterally without cough on insp or exp maneuvers   CV:  RRR  no s3 or murmur or increase in P2, no edema   ABD:  soft and nontender with nl excursion in the supine position. No bruits or organomegaly, bowel sounds nl  MS:  warm without deformities, calf tenderness, cyanosis or clubbing  SKIN: warm and dry without lesions    NEURO:  alert, approp, no deficits   CT chest May 06 2012  RLL as dz RUL ll mm nodule Mulitple LN's some partially calcified     Assessment & Plan:

## 2012-05-27 ENCOUNTER — Ambulatory Visit: Payer: Self-pay | Admitting: Internal Medicine

## 2012-05-27 DIAGNOSIS — M899 Disorder of bone, unspecified: Secondary | ICD-10-CM | POA: Diagnosis not present

## 2012-05-27 DIAGNOSIS — M810 Age-related osteoporosis without current pathological fracture: Secondary | ICD-10-CM | POA: Diagnosis not present

## 2012-05-27 DIAGNOSIS — N959 Unspecified menopausal and perimenopausal disorder: Secondary | ICD-10-CM | POA: Diagnosis not present

## 2012-05-27 LAB — HM DEXA SCAN

## 2012-05-27 NOTE — Assessment & Plan Note (Signed)
Most likely this is acei related as assoc with dry cough.  Try off x 4 weeks then regroup if still present

## 2012-05-27 NOTE — Assessment & Plan Note (Signed)

## 2012-05-27 NOTE — Assessment & Plan Note (Addendum)
Although there are clearly abnormalities on CT scan, they should probably be considered "microscopic" since not obvious on plain cxr  But probably completely benign in a never smoker.   In the setting of obvious "macroscopic" health issues,  I am very reluctatnt to embark on an invasive w/u at this point but will arrange consevative  follow up and in the meantime see what we can do to address the patient's subjective concerns ( see sob)  Discussed in detail all the  indications, usual  risks and alternatives  relative to the benefits with patient who agrees to proceed with conservative f/u with cxr and possibly repeat CT Chest in  6 months to a year

## 2012-06-01 ENCOUNTER — Other Ambulatory Visit: Payer: Self-pay | Admitting: Internal Medicine

## 2012-06-01 MED ORDER — METFORMIN HCL 1000 MG PO TABS: 1000.0000 mg | ORAL_TABLET | Freq: Two times a day (BID) | ORAL | Status: DC

## 2012-06-01 NOTE — Telephone Encounter (Signed)
Med filled.  

## 2012-06-01 NOTE — Telephone Encounter (Signed)
metFORMIN (GLUCOPHAGE) 1000 MG tablet  #60

## 2012-06-06 DIAGNOSIS — R079 Chest pain, unspecified: Secondary | ICD-10-CM

## 2012-06-06 DIAGNOSIS — R911 Solitary pulmonary nodule: Secondary | ICD-10-CM | POA: Diagnosis not present

## 2012-06-06 DIAGNOSIS — M810 Age-related osteoporosis without current pathological fracture: Secondary | ICD-10-CM | POA: Diagnosis not present

## 2012-06-08 DIAGNOSIS — L97509 Non-pressure chronic ulcer of other part of unspecified foot with unspecified severity: Secondary | ICD-10-CM | POA: Diagnosis not present

## 2012-06-08 DIAGNOSIS — I739 Peripheral vascular disease, unspecified: Secondary | ICD-10-CM | POA: Diagnosis not present

## 2012-06-11 ENCOUNTER — Encounter: Payer: Self-pay | Admitting: Internal Medicine

## 2012-06-14 ENCOUNTER — Telehealth: Payer: Self-pay | Admitting: Internal Medicine

## 2012-06-14 ENCOUNTER — Emergency Department: Payer: Self-pay | Admitting: Emergency Medicine

## 2012-06-14 DIAGNOSIS — Z79899 Other long term (current) drug therapy: Secondary | ICD-10-CM | POA: Diagnosis not present

## 2012-06-14 DIAGNOSIS — I10 Essential (primary) hypertension: Secondary | ICD-10-CM | POA: Diagnosis not present

## 2012-06-14 DIAGNOSIS — Z794 Long term (current) use of insulin: Secondary | ICD-10-CM | POA: Diagnosis not present

## 2012-06-14 DIAGNOSIS — R0602 Shortness of breath: Secondary | ICD-10-CM | POA: Diagnosis not present

## 2012-06-14 DIAGNOSIS — E119 Type 2 diabetes mellitus without complications: Secondary | ICD-10-CM | POA: Diagnosis not present

## 2012-06-14 DIAGNOSIS — J90 Pleural effusion, not elsewhere classified: Secondary | ICD-10-CM | POA: Diagnosis not present

## 2012-06-14 DIAGNOSIS — J209 Acute bronchitis, unspecified: Secondary | ICD-10-CM | POA: Diagnosis not present

## 2012-06-14 LAB — BASIC METABOLIC PANEL
Anion Gap: 5 — ABNORMAL LOW (ref 7–16)
EGFR (African American): >60
Potassium: 4.2 mmol/L (ref 3.5–5.1)

## 2012-06-14 LAB — CBC
HGB: 11.9 g/dL — ABNORMAL LOW (ref 12.0–16.0)
MCHC: 33.4 g/dL (ref 32.0–36.0)
RDW: 15 % — ABNORMAL HIGH (ref 11.5–14.5)
WBC: 6.5 10*3/uL (ref 3.6–11.0)

## 2012-06-14 NOTE — Telephone Encounter (Signed)
Patient Information:  Caller Name: Ghalia  Phone: [phone]  Patient: Pamela Collier, Pamela Collier  Gender: Female  DOB: December 15, 1940  Age: 72 Years  PCP: Deborra Medina (Adults only)  Office Follow Up:  Does the office need to follow up with this patient?: No  Instructions For The Office: N/A  RN Note:  Pt  c/o of SOB and really concerned about the wheezing. Pt with history of lung disease and CHF. RN observes diffculty with talking as well due to  difficulty breathing. RN advised for pt to go to ED now and pt agreed and husband will take to [facility] ED now.   Symptoms  Reason For Call & Symptoms: Onset of cough/ congestion since last night 06/13/2012. Also has had onset of wheezing this morning. Feels  more  SOB especially with laying down  Reviewed Health History In EMR: Yes  Reviewed Medications In EMR: Yes  Reviewed Allergies In EMR: Yes  Reviewed Surgeries / Procedures: Yes  Date of Onset of Symptoms: 06/13/2012  Treatments Tried: Cough drop  Treatments Tried Worked: No  Guideline(s) Used:  Cough  Disposition Per Guideline:   Go to ED Now  Reason For Disposition Reached:   Difficulty breathing  Advice Given:  N/A

## 2012-06-16 ENCOUNTER — Ambulatory Visit (INDEPENDENT_AMBULATORY_CARE_PROVIDER_SITE_OTHER): Payer: Medicare Other | Admitting: Internal Medicine

## 2012-06-16 ENCOUNTER — Encounter: Payer: Self-pay | Admitting: Internal Medicine

## 2012-06-16 VITALS — BP 130/68 | HR 66 | Temp 98.2°F | Resp 15 | Wt 183.0 lb

## 2012-06-16 DIAGNOSIS — M810 Age-related osteoporosis without current pathological fracture: Secondary | ICD-10-CM

## 2012-06-16 DIAGNOSIS — I70269 Atherosclerosis of native arteries of extremities with gangrene, unspecified extremity: Secondary | ICD-10-CM

## 2012-06-16 DIAGNOSIS — I5031 Acute diastolic (congestive) heart failure: Secondary | ICD-10-CM

## 2012-06-16 DIAGNOSIS — J9600 Acute respiratory failure, unspecified whether with hypoxia or hypercapnia: Secondary | ICD-10-CM

## 2012-06-16 DIAGNOSIS — E1321 Other specified diabetes mellitus with diabetic nephropathy: Secondary | ICD-10-CM

## 2012-06-16 DIAGNOSIS — E1165 Type 2 diabetes mellitus with hyperglycemia: Secondary | ICD-10-CM

## 2012-06-16 DIAGNOSIS — I2589 Other forms of chronic ischemic heart disease: Secondary | ICD-10-CM

## 2012-06-16 DIAGNOSIS — E1329 Other specified diabetes mellitus with other diabetic kidney complication: Secondary | ICD-10-CM

## 2012-06-16 DIAGNOSIS — I255 Ischemic cardiomyopathy: Secondary | ICD-10-CM

## 2012-06-16 DIAGNOSIS — N058 Unspecified nephritic syndrome with other morphologic changes: Secondary | ICD-10-CM

## 2012-06-16 DIAGNOSIS — IMO0001 Reserved for inherently not codable concepts without codable children: Secondary | ICD-10-CM

## 2012-06-16 LAB — CBC WITH DIFFERENTIAL/PLATELET
Basophils Absolute: 0 10*3/uL (ref 0.0–0.1)
Basophils Relative: 0.4 % (ref 0.0–3.0)
Eosinophils Absolute: 0.1 10*3/uL (ref 0.0–0.7)
Eosinophils Relative: 0.8 % (ref 0.0–5.0)
HCT: 35.8 % — ABNORMAL LOW (ref 36.0–46.0)
Hemoglobin: 12 g/dL (ref 12.0–15.0)
Lymphocytes Relative: 19.4 % (ref 12.0–46.0)
Lymphs Abs: 1.4 10*3/uL (ref 0.7–4.0)
MCHC: 33.5 g/dL (ref 30.0–36.0)
MCV: 87.7 fl (ref 78.0–100.0)
Monocytes Absolute: 0.7 10*3/uL (ref 0.1–1.0)
Monocytes Relative: 9.2 % (ref 3.0–12.0)
Neutro Abs: 5.2 10*3/uL (ref 1.4–7.7)
Neutrophils Relative %: 70.2 % (ref 43.0–77.0)
Platelets: 297 10*3/uL (ref 150.0–400.0)
RBC: 4.09 Mil/uL (ref 3.87–5.11)
RDW: 15 % — ABNORMAL HIGH (ref 11.5–14.6)
WBC: 7.5 10*3/uL (ref 4.5–10.5)

## 2012-06-16 LAB — COMPREHENSIVE METABOLIC PANEL
ALT: 16 U/L (ref 0–35)
AST: 19 U/L (ref 0–37)
Albumin: 3.6 g/dL (ref 3.5–5.2)
Alkaline Phosphatase: 42 U/L (ref 39–117)
BUN: 13 mg/dL (ref 6–23)
CO2: 26 mEq/L (ref 19–32)
Calcium: 10 mg/dL (ref 8.4–10.5)
Chloride: 107 mEq/L (ref 96–112)
Creatinine, Ser: 0.8 mg/dL (ref 0.4–1.2)
GFR: 85.68 mL/min (ref >60.00)
Glucose, Bld: 94 mg/dL (ref 70–99)
Potassium: 4.1 mEq/L (ref 3.5–5.1)
Sodium: 139 mEq/L (ref 135–145)
Total Bilirubin: 1 mg/dL (ref 0.3–1.2)
Total Protein: 7.1 g/dL (ref 6.0–8.3)

## 2012-06-16 LAB — LIPID PANEL
Cholesterol: 153 mg/dL (ref 0–200)
Triglycerides: 159 mg/dL — ABNORMAL HIGH (ref 0.0–149.0)

## 2012-06-16 LAB — HEMOGLOBIN A1C: Hgb A1c MFr Bld: 8.4 % — ABNORMAL HIGH (ref 4.6–6.5)

## 2012-06-16 MED ORDER — OLMESARTAN MEDOXOMIL 40 MG PO TABS: ORAL_TABLET | ORAL | Status: DC

## 2012-06-16 NOTE — Progress Notes (Signed)
Patient ID: Pamela Collier, female   DOB: May 03, 1941, 72 y.o.   MRN: [id number]  Patient Active Problem List  Diagnosis  . Diabetes mellitus type 2, uncontrolled  . Double vessel coronary artery disease  . Cardiomyopathy, ischemic  . Hepatic steatosis  . Osteoporosis, post-menopausal  . 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 Atherosclerotic peripheral vascular disease with gangrene  . 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 Depression with anxiety  . Heart failure, diastolic, acute  . Pulmonary nodule seen on imaging study  . SOB (shortness of breath)  . Respiratory failure, acute    Subjective:  CC:   Chief Complaint  Patient presents with  . Follow-up    HPI:   Pamela Collier a 72 y.o. female who presents follow up on chronic issues including diabetes mellitus and ischemic cardiomyopathy.    CAD:  She was treated in the ER on Monday  for shortness of breath.  CXR and EKG were done and she was treated for bronchitis with a nebulizer treatment and discharged with a 5 day course of oral azithromycin. She continues to report nocturnal symptoms but denies orthopnea. she reports increased work of breathing with walking but denies Chest pain and fluid retention. There is no smoking in the house. She has been using her combiventinhaler  And has been tolerating the azithromycin.   DM:  Her blood sugars for the last 4 days have ranged from 113 to 163 fasting and 107 to 169 post prandiallly.  January readings were > 130 fasting over 33% of the time and > 150 post prandially about 20% of the time on her current insulin regimen of 30 units of 70/30 insulin before dinner and 27 units before breakfast  Osteoporosis:  She has had a DEXA scan done recently to monitor history of osteoporosis treated with bisphosphonate therapy, currently Boniva, for close to 10 years    Past Medical History  Diagnosis Date  . Diabetes mellitus   . Coronary artery disease,  occlusive Jan 2013    severe RCA stenosis ,  mod LAD    . Cardiomyopathy, ischemic Jan 2013    EF 35 to 40% [facility])  . Hepatic steatosis     by CT abd pelvis  . Osteoporosis, post-menopausal   . 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 Atherosclerotic peripheral vascular disease with gangrene august 2012    Past Surgical History  Procedure Date  . Abdominal hysterectomy     at ge 39. secondary to bleeding  . 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 Ptca August 2012    Right Posterior tibial artery , [place]         The following portions of the patient's history were reviewed and updated as appropriate: Allergies, current medications, and problem list.    Review of Systems:   .Patient denies headache, fevers, malaise, unintentional weight loss, skin rash, eye pain, sinus congestion and sinus pain, sore throat, dysphagia,  hemoptysis , chest pain, palpitations, orthopnea, edema, abdominal pain, nausea, melena, diarrhea, constipation, flank pain, dysuria, hematuria, urinary  Frequency, nocturia, numbness, tingling, seizures,  Focal weakness, Loss of consciousness,  Tremor, insomnia, depression, anxiety, and suicidal ideation.       History   Social History  . Marital Status: Married    Spouse Name: N/A    Number of Children: N/A  . Years of  Education: N/A   Occupational History  . Not on file.   Social History Main Topics  . Smoking status: Never Smoker   . Smokeless tobacco: Never Used  . Alcohol Use: No  . Drug Use: No  . Sexually Active: Yes   Other Topics Concern  . Not on file   Social History Narrative  . No narrative on file    Objective:  BP 130/68  Pulse 66  Temp 98.2 F (36.8 C) (Oral)  Resp 15  Wt 183 lb (83.008 kg)  SpO2 96%  General appearance: alert, cooperative and appears stated age Ears: normal TM's and external ear canals both  ears Throat: lips, mucosa, and tongue normal; teeth and gums normal Neck: no adenopathy, no carotid bruit, supple, symmetrical, trachea midline and thyroid not enlarged, symmetric, no tenderness/mass/nodules Back: symmetric, no curvature. ROM normal. No CVA tenderness. Lungs: clear to auscultation bilaterally Heart: regular rate and rhythm, S1, S2 normal, no murmur, click, rub or gallop Abdomen: soft, non-tender; bowel sounds normal; no masses,  no organomegaly Pulses: 2+ and symmetric Skin: Skin color, texture, turgor normal. No rashes or lesions Lymph nodes: Cervical, supraclavicular, and axillary nodes normal.  Assessment and Plan:  Respiratory failure, acute  She was not hypoxic by ER records and appears to be well compensated today. However review on online EKG and CXR done on Feb 3rd suggested mild CHF as the cause of her recent episode of dyspnea.  Cardiomyopathy, ischemic Continue Ranexa, lasix, daily weights, asa, statin and ARB.  Her weight is stable.   Atherosclerotic peripheral vascular disease with gangrene With prior amputations for gangrene by Samara Deist to right foot Nov 2012, and oingonign care for DFU currentyly.  and interventions by Leotis Pain   Diabetes mellitus type 2, uncontrolled Improving A1c but not at goal  Last week's blood sugars were actually fine so no adjustments were made today  To 70/30 regimen. Patient was asked to submit more BS readings every 2 weeks .  Reminder for diabetic eye exam given.  She is on apprpriate medications for mild nephropathy with normal renal function  And liver function is stable.    Heart failure, diastolic, acute Dr. Melvyn Novas changed ACE I to ARB for persistent cough.  05/27/12. patient still coughing,  Encouraged to keep follow up with him as it can take  up to 2 months for cough to resolve.   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  Osteoporosis,  post-menopausal Awaiting insurance approval for Prolia.   A total of 60 minutes was spent with patient pver 50% was spent in counselling, coordination of care, including reviewing ER records and recent laboratory data.   Updated Medication List Outpatient Encounter Prescriptions as of 06/16/2012  Medication Sig Dispense Refill  . acetaminophen (TYLENOL) 325 MG tablet Take 650 mg by mouth every 4 (four) hours as needed.      [name] amLODipine (NORVASC) 10 MG tablet Take 1 tablet (10 mg total) by mouth daily.  90 tablet  3  . aspirin 81 MG EC tablet Take 1 tablet (81 mg total) by mouth daily. Swallow whole.  30 tablet  12  . eszopiclone (LUNESTA) 2 MG TABS Take 1 tablet (2 mg total) by mouth at bedtime. Take immediately before bedtime  7 tablet  0  . furosemide (LASIX) 20 MG tablet Take 1 tablet (20 mg total) by mouth daily.  90 tablet  3  . glucose blood test strip Check blood sugar  3 times a day DX CODE: 250.0  100 each  11  . ibandronate (BONIVA) 150 MG tablet Take 1 tablet (150 mg total) by mouth every 30 (thirty) days. Take in the morning with a full glass of water, on an empty stomach, and do not take anything else by mouth or lie down for the next 30 min.  1 tablet  11  . INS SYRINGE/NEEDLE 1CC/28G 28G X 1/2" 1 ML MISC 1 each by Does not apply route as needed. To test glucose levels daily.  200 each  3  . insulin aspart protamine-insulin aspart (NOVOLOG MIX 70/30 FLEXPEN) (70-30) 100 UNIT/ML injection Please fill using the pens.  30 units before dinner,  27 units before breakfast  Needs 18 pens for a 90 day supply   PLEASE ALSO SUPPLY NEEDLES FOR THE FLEXPEN 180/90 DAY  54 mL  3  . Ipratropium-Albuterol (COMBIVENT RESPIMAT) 20-100 MCG/ACT AERS respimat Inhale 1 puff into the lungs every 6 (six) hours.      . metFORMIN (GLUCOPHAGE) 1000 MG tablet Take 1 tablet (1,000 mg total) by mouth 2 (two) times daily with a meal.  60 tablet  3  . metoprolol tartrate (LOPRESSOR) 25 MG tablet Take 1 tablet (25  mg total) by mouth 2 (two) times daily.  180 tablet  3  . olmesartan (BENICAR) 40 MG tablet One half daily  90 tablet  3  . pregabalin (LYRICA) 100 MG capsule Take 1 capsule (100 mg total) by mouth 3 (three) times daily.  90 capsule  3  . rosuvastatin (CRESTOR) 10 MG tablet Take 1 tablet (10 mg total) by mouth daily.  90 tablet  3  . traMADol-acetaminophen (ULTRACET) 37.5-325 MG per tablet Take 1 tablet by mouth every 6 (six) hours as needed.      . [DISCONTINUED] insulin glargine (LANTUS) 100 UNIT/ML injection Inject 30 Units into the skin 2 (two) times daily. 24 at units at bedtime.  10 mL  5  . [DISCONTINUED] olmesartan (BENICAR) 40 MG tablet One half daily      . [DISCONTINUED] olmesartan (BENICAR) 40 MG tablet One half daily  30 tablet  3  . azithromycin (ZITHROMAX) 250 MG tablet       . B-D ULTRAFINE III SHORT PEN 31G X 8 MM MISC        Facility-Administered Encounter Medications as of 06/16/2012  Medication Dose Route Frequency Provider Last Rate Last Dose  . albuterol (PROVENTIL) (5 MG/ML) 0.5% nebulizer solution 2.5 mg  2.5 mg Nebulization Once [name], MD

## 2012-06-17 ENCOUNTER — Encounter: Payer: Self-pay | Admitting: Internal Medicine

## 2012-06-17 DIAGNOSIS — J9600 Acute respiratory failure, unspecified whether with hypoxia or hypercapnia: Secondary | ICD-10-CM | POA: Insufficient documentation

## 2012-06-17 LAB — MICROALBUMIN / CREATININE URINE RATIO
Creatinine,U: 214.8 mg/dL
Microalb Creat Ratio: 131 mg/g — ABNORMAL HIGH (ref 0.0–30.0)

## 2012-06-17 MED ORDER — OLMESARTAN MEDOXOMIL 40 MG PO TABS: ORAL_TABLET | ORAL | Status: DC

## 2012-06-17 NOTE — Assessment & Plan Note (Addendum)
Continue Ranexa, lasix, daily weights, asa, statin and ARB.  Her weight is stable.

## 2012-06-17 NOTE — Assessment & Plan Note (Signed)
Awaiting insurance approval for Prolia.

## 2012-06-17 NOTE — Assessment & Plan Note (Signed)
Dr. Melvyn Novas changed ACE I to ARB for persistent cough.  05/27/12. patient still coughing,  Encouraged to keep follow up with him as it can take  up to 2 months for cough to resolve.

## 2012-06-17 NOTE — Assessment & Plan Note (Signed)
She was not hypoxic by ER records and appears to be well compensated today. However review on online EKG and CXR done on Feb 3rd suggested mild CHF as the cause of her recent episode of dyspnea.

## 2012-06-17 NOTE — Assessment & Plan Note (Addendum)
Improving A1c but not at goal  Last week's blood sugars were actually fine so no adjustments were made today  To 70/30 regimen. Patient was asked to submit more BS readings every 2 weeks .  Reminder for diabetic eye exam given.  She is on apprpriate medications for mild nephropathy with normal renal function  And liver function is stable.

## 2012-06-17 NOTE — Assessment & Plan Note (Signed)
With prior amputations for gangrene by Samara Deist to right foot Nov 2012, and oingonign care for DFU currentyly.  and interventions by Leotis Pain

## 2012-06-17 NOTE — Assessment & Plan Note (Signed)
She has relatively preserved renal function with proteinuria.

## 2012-06-28 ENCOUNTER — Ambulatory Visit (INDEPENDENT_AMBULATORY_CARE_PROVIDER_SITE_OTHER): Payer: Medicare Other | Admitting: Pulmonary Disease

## 2012-06-28 ENCOUNTER — Encounter: Payer: Self-pay | Admitting: Pulmonary Disease

## 2012-06-28 ENCOUNTER — Telehealth: Payer: Self-pay | Admitting: Pulmonary Disease

## 2012-06-28 VITALS — BP 158/92 | HR 68 | Ht 66.0 in | Wt 186.0 lb

## 2012-06-28 DIAGNOSIS — R0602 Shortness of breath: Secondary | ICD-10-CM

## 2012-06-28 DIAGNOSIS — R911 Solitary pulmonary nodule: Secondary | ICD-10-CM | POA: Diagnosis not present

## 2012-06-28 MED ORDER — MOMETASONE FUROATE 50 MCG/ACT NA SUSP: 2.0000 | Freq: Every day | NASAL | Status: DC

## 2012-06-28 NOTE — Telephone Encounter (Signed)
pts nasonex wasn't sent to the pharmacy rx sent. Pt aware nothing further needed.

## 2012-06-28 NOTE — Progress Notes (Signed)
Subjective:    Patient ID: Pamela Collier, female    DOB: March 05, 1941, 72 y.o.   MRN: [id number]  Synopsis: This is a 72 year old female first seen by Dr. Melvyn Novas in the [facility] pulmonary office in January 2014 for pulmonary nodule and some shortness of breath. An ACE inhibitor was held during that visit and followup imaging was planned.  HPI  06/28/12 rov--Since her last visit Ms. Salt says that her shortness of breath has improved. She had cough previously but this has improved since stopping the ACE inhibitor. She continues to have a globus sensation in her throat and notes postnasal drip and sinus congestion. She was recently seen an emergency room for what sounds like a congestive heart telemetry exacerbation in the setting of her known ischemic cardiomyopathy. She is feeling much better now.   Past Medical History  Diagnosis Date  . Diabetes mellitus   . 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 Cardiomyopathy, ischemic Jan 2013    EF 35 to 40% [facility])  . Hepatic steatosis     by CT abd pelvis  . Osteoporosis, post-menopausal   . 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 Atherosclerotic peripheral vascular disease with gangrene august 2012   Review of Systems     Objective:   Physical Exam  Filed Vitals:   06/28/12 1122  BP: 158/92  Pulse: 68  Height: 5\' 6"  (1.676 m)  Weight: 186 lb (84.369 kg)  SpO2: 99%   Gen: well appearing, no acute distress HEENT: NCAT, PERRL, EOMi, OP clear, neck supple without masses PULM: CTA B CV: RRR, no mgr, no JVD AB: BS+, soft, nontender, no hsm Ext: warm, no edema, no clubbing, no cyanosis  January 2014 CXR [facility] reviewed by me with vascular congestion and cardiomegally, no mass.     Assessment & Plan:   SOB (shortness of breath) She had a recent ER visit for what sounds like a congestive heart  failure exacerbation. Today on exam she says she is feeling better and she is not volume overloaded.  There is likely some component of ACE inhibitor induced cough and tracheal irritation contributing and she says this has improved.  Plan: -Continue to abstain from ACE inhibitors -Continue Benicar -I added saline sprays and over-the-counter decongestants and antihistamines to help with postnasal drip likely contributing to the sensation of phlegm in her throat.  Pulmonary nodule seen on imaging study A chest x-ray performed in January 2014 at [facility] did not show a mass.  Plan: -Followup with Dr. Melvyn Novas in 4 months to discuss if she needs a repeat chest x-ray or chest CT.   Updated Medication List Outpatient Encounter Prescriptions as of 06/28/2012  Medication Sig Dispense Refill  . acetaminophen (TYLENOL) 325 MG tablet Take 650 mg by mouth every 4 (four) hours as needed.      [name] amLODipine (NORVASC) 10 MG tablet Take 1 tablet (10 mg total) by mouth daily.  90 tablet  3  . aspirin 81 MG EC tablet Take 1 tablet (81 mg total) by mouth daily. Swallow whole.  30 tablet  12  . B-D ULTRAFINE III SHORT PEN 31G X 8 MM MISC       . eszopiclone (LUNESTA) 2 MG TABS Take 1 tablet (2 mg total) by mouth at bedtime. Take immediately before bedtime  7 tablet  0  .  furosemide (LASIX) 20 MG tablet Take 1 tablet (20 mg total) by mouth daily.  90 tablet  3  . glucose blood test strip Check blood sugar 3 times a day DX CODE: 250.0  100 each  11  . ibandronate (BONIVA) 150 MG tablet Take 1 tablet (150 mg total) by mouth every 30 (thirty) days. Take in the morning with a full glass of water, on an empty stomach, and do not take anything else by mouth or lie down for the next 30 min.  1 tablet  11  . INS SYRINGE/NEEDLE 1CC/28G 28G X 1/2" 1 ML MISC 1 each by Does not apply route as needed. To test glucose levels daily.  200 each  3  . insulin aspart protamine-insulin aspart (NOVOLOG MIX 70/30 FLEXPEN) (70-30) 100  UNIT/ML injection Please fill using the pens.  30 units before dinner,  27 units before breakfast  Needs 18 pens for a 90 day supply   PLEASE ALSO SUPPLY NEEDLES FOR THE FLEXPEN 180/90 DAY  54 mL  3  . Ipratropium-Albuterol (COMBIVENT RESPIMAT) 20-100 MCG/ACT AERS respimat Inhale 1 puff into the lungs every 6 (six) hours.      . metFORMIN (GLUCOPHAGE) 1000 MG tablet Take 1 tablet (1,000 mg total) by mouth 2 (two) times daily with a meal.  60 tablet  3  . metoprolol tartrate (LOPRESSOR) 25 MG tablet Take 1 tablet (25 mg total) by mouth 2 (two) times daily.  180 tablet  3  . olmesartan (BENICAR) 40 MG tablet One half daily  90 tablet  3  . pregabalin (LYRICA) 100 MG capsule Take 1 capsule (100 mg total) by mouth 3 (three) times daily.  90 capsule  3  . rosuvastatin (CRESTOR) 10 MG tablet Take 1 tablet (10 mg total) by mouth daily.  90 tablet  3  . traMADol-acetaminophen (ULTRACET) 37.5-325 MG per tablet Take 1 tablet by mouth every 6 (six) hours as needed.      . [DISCONTINUED] azithromycin (ZITHROMAX) 250 MG tablet        Facility-Administered Encounter Medications as of 06/28/2012  Medication Dose Route Frequency Provider Last Rate Last Dose  . albuterol (PROVENTIL) (5 MG/ML) 0.5% nebulizer solution 2.5 mg  2.5 mg Nebulization Once [name], MD

## 2012-06-28 NOTE — Patient Instructions (Signed)
Keep taking the Benicar when it comes in the mail.  In the meantime use the sample medications we gave you today (once daily) Use Milta Deiters Med rinses with distilled water at least twice per day using the instructions on the package. 1/2 hour after using the [facility] Med rinse, use Nasonex two puffs in each nostril once per day. Use chlortrimeton and an over the counter decongestant (phenylephrine or pseudophed) as needed for the sinus congestion and cough.  We will have you follow up with Dr. Melvyn Novas in 4 months

## 2012-06-28 NOTE — Assessment & Plan Note (Signed)
She had a recent ER visit for what sounds like a congestive heart failure exacerbation. Today on exam she says she is feeling better and she is not volume overloaded.  There is likely some component of ACE inhibitor induced cough and tracheal irritation contributing and she says this has improved.  Plan: -Continue to abstain from ACE inhibitors -Continue Benicar -I added saline sprays and over-the-counter decongestants and antihistamines to help with postnasal drip likely contributing to the sensation of phlegm in her throat.

## 2012-06-28 NOTE — Assessment & Plan Note (Signed)
A chest x-ray performed in January 2014 at [facility] did not show a mass.  Plan: -Followup with Dr. Melvyn Novas in 4 months to discuss if she needs a repeat chest x-ray or chest CT.

## 2012-06-30 ENCOUNTER — Telehealth: Payer: Self-pay | Admitting: Pulmonary Disease

## 2012-06-30 NOTE — Telephone Encounter (Signed)
ATC, NA and no option to leave a msg

## 2012-07-01 ENCOUNTER — Telehealth: Payer: Self-pay | Admitting: *Deleted

## 2012-07-01 MED ORDER — FLUTICASONE PROPIONATE 50 MCG/ACT NA SUSP: 2.0000 | Freq: Every day | NASAL | Status: DC

## 2012-07-01 NOTE — Telephone Encounter (Signed)
She does not need labs  She needs to submit a log of her blood sugars for the past 2 weeks.  Per note from 2/5

## 2012-07-01 NOTE — Telephone Encounter (Signed)
Patient is coming in for labs tomorrow 2.21.2014 what labs and dx would you like? Thank you

## 2012-07-01 NOTE — Telephone Encounter (Signed)
Called, spoke with pt.  Pt states the medication that was sent at last OV with BQ on 2/17 was too expensive.  Pt states she is unsure what the medication is or what is was form.  Pt states she is unsure what is needed and requesting I call [facility] to see what is needed.    [employer], spoke with Dawn.  Was advised nasonex needs a PA.  We have this form, but I do not see where pt has been on flonase and this is a preferred.  Dawn states they are not showing flonase on pt's past medication list either.  I have given VO to change nasonex to flonase 2 sprays each nostril once daily.  Dawn verbalized understanding.  I called, spoke with pt.  She is aware of this and I explained to pt how to use the flonase.  I advised to call back if she has any further questions or has any problems with this medication.  She verbalized understanding.

## 2012-07-02 ENCOUNTER — Other Ambulatory Visit: Payer: Medicare Other

## 2012-07-02 ENCOUNTER — Telehealth: Payer: Self-pay | Admitting: *Deleted

## 2012-07-02 NOTE — Telephone Encounter (Signed)
Patient came in today, she states she wasn't aware about having to do a blood sugar log

## 2012-07-02 NOTE — Telephone Encounter (Signed)
Pt.notified

## 2012-07-02 NOTE — Telephone Encounter (Signed)
please ask patient to keep a log of BS over the next two weeks and return with appt ,.  This was dicsused at last visit

## 2012-07-21 ENCOUNTER — Ambulatory Visit: Payer: Medicare Other | Admitting: Internal Medicine

## 2012-07-22 ENCOUNTER — Telehealth: Payer: Self-pay | Admitting: Internal Medicine

## 2012-07-22 NOTE — Telephone Encounter (Signed)
Pt dropped off handi-cap sticker and plate forms to be filled out. I put them in Dr. Lupita Dawn box up front. Pt has appt on Monday and she will pick them up then.

## 2012-07-26 ENCOUNTER — Other Ambulatory Visit: Payer: Self-pay | Admitting: *Deleted

## 2012-07-26 MED ORDER — METFORMIN HCL 1000 MG PO TABS: 1000.0000 mg | ORAL_TABLET | Freq: Two times a day (BID) | ORAL | Status: DC

## 2012-07-26 NOTE — Telephone Encounter (Signed)
metFORMIN (GLUCOPHAGE) 1000 MG tablet   # 90  Day supply  Send to [facility]   She is needing a 30 day supply sent to [facility] until her supply comes in from [place].

## 2012-07-26 NOTE — Telephone Encounter (Signed)
Med filled.  

## 2012-07-27 ENCOUNTER — Ambulatory Visit: Payer: Medicare Other | Admitting: Internal Medicine

## 2012-08-03 ENCOUNTER — Ambulatory Visit: Payer: Medicare Other | Admitting: Internal Medicine

## 2012-08-03 ENCOUNTER — Ambulatory Visit (INDEPENDENT_AMBULATORY_CARE_PROVIDER_SITE_OTHER): Payer: Medicare Other | Admitting: Internal Medicine

## 2012-08-03 ENCOUNTER — Encounter: Payer: Self-pay | Admitting: Internal Medicine

## 2012-08-03 VITALS — BP 140/86 | HR 69 | Temp 97.6°F | Resp 16 | Wt 181.5 lb

## 2012-08-03 DIAGNOSIS — R7309 Other abnormal glucose: Secondary | ICD-10-CM | POA: Diagnosis not present

## 2012-08-03 DIAGNOSIS — I798 Other disorders of arteries, arterioles and capillaries in diseases classified elsewhere: Secondary | ICD-10-CM | POA: Diagnosis not present

## 2012-08-03 DIAGNOSIS — E1159 Type 2 diabetes mellitus with other circulatory complications: Secondary | ICD-10-CM | POA: Diagnosis not present

## 2012-08-03 DIAGNOSIS — S98139A Complete traumatic amputation of one unspecified lesser toe, initial encounter: Secondary | ICD-10-CM | POA: Diagnosis not present

## 2012-08-03 DIAGNOSIS — Q828 Other specified congenital malformations of skin: Secondary | ICD-10-CM | POA: Diagnosis not present

## 2012-08-03 DIAGNOSIS — E1165 Type 2 diabetes mellitus with hyperglycemia: Secondary | ICD-10-CM

## 2012-08-03 DIAGNOSIS — M810 Age-related osteoporosis without current pathological fracture: Secondary | ICD-10-CM | POA: Diagnosis not present

## 2012-08-03 DIAGNOSIS — B351 Tinea unguium: Secondary | ICD-10-CM | POA: Diagnosis not present

## 2012-08-03 LAB — GLUCOSE, POCT (MANUAL RESULT ENTRY): POC Glucose: 280 mg/dl — AB (ref 70–99)

## 2012-08-03 MED ORDER — ROSUVASTATIN CALCIUM 10 MG PO TABS: 10.0000 mg | ORAL_TABLET | Freq: Every day | ORAL | Status: DC

## 2012-08-03 MED ORDER — ALPRAZOLAM 0.5 MG PO TABS: 0.5000 mg | ORAL_TABLET | Freq: Every day | ORAL | Status: DC | PRN

## 2012-08-03 NOTE — Assessment & Plan Note (Addendum)
a1c has improved to 8.4.  She is requesting a copy of the low glycemic index diet . She was advised to reduce her insulin doses by several units to prevent hypoglycemia from occurring.

## 2012-08-03 NOTE — Progress Notes (Signed)
Patient ID: Pamela Collier, female   DOB: 08-03-1940, 72 y.o.   MRN: [id number]  Patient Active Problem List  Diagnosis  . Diabetes mellitus type 2, uncontrolled  . Double vessel coronary artery disease  . Cardiomyopathy, ischemic  . Hepatic steatosis  . Osteoporosis, post-menopausal  . 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 Atherosclerotic peripheral vascular disease with gangrene  . 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 Depression with anxiety  . Heart failure, diastolic, acute  . Pulmonary nodule seen on imaging study  . SOB (shortness of breath)  . Respiratory failure, acute    Subjective:  CC:   Chief Complaint  Patient presents with  . Blood Sugar Check    HPI:   Pamela Collier a 72 y.o. female who presents for 6 week follow up on diabetes mellitus, uncontrolled.  For the past 6 weeks she has been checking her blood sugars two or three times daily and modifying her diet. She is drinking diabetic shakes in the morning instead of skipping breakfast. She is taking 28 units in the AM and 30 units in the PM of 70/30 insulin .  For lunch today she had a small dinner roll,  boiled asparagus, and tossed salad with croutons and ranch dressing . Her post prandial sugars today  In the office is  280  .,  Fastings have been 140 to 180  ;pre dinner sugars 114 to 180 about 50% are higher than 150.  Post dinners sugars are mostly < 150.      Past Medical History  Diagnosis Date  . Diabetes mellitus   . 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 Cardiomyopathy, ischemic Jan 2013    EF 35 to 40% [facility])  . Hepatic steatosis     by CT abd pelvis  . Osteoporosis, post-menopausal   . 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 Atherosclerotic peripheral vascular disease with gangrene august 2012    Past Surgical History  Procedure  Laterality Date  . Abdominal hysterectomy      at ge 39. secondary to bleeding  . Toe amputation  Sept 2012    Right 2nd toe, [place]  . Ptca  August 2012    Right Posterior tibial artery , Dew    The following portions of the patient's history were reviewed and updated as appropriate: Allergies, current medications, and problem list.  Review of Systems:   12 Pt  review of systems was negative except those addressed in the HPI,   History   Social History  . Marital Status: Married    Spouse Name: N/A    Number of Children: N/A  . Years of Education: N/A   Occupational History  . Not on file.   Social History Main Topics  . Smoking status: Never Smoker   . Smokeless tobacco: Never Used  . Alcohol Use: No  . Drug Use: No  . Sexually Active: Yes   Other Topics Concern  . Not on file   Social History Narrative  . No narrative on file    Objective:  BP 140/86  Pulse 69  Temp(Src) 97.6 F (36.4 C) (Oral)  Resp 16  Wt 181 lb 8 oz (82.328 kg)  BMI 29.31 kg/m2  SpO2 98%  General appearance: alert, cooperative and appears stated age Ears: normal TM's and  external ear canals both ears Throat: lips, mucosa, and tongue normal; teeth and gums normal Neck: no adenopathy, no carotid bruit, supple, symmetrical, trachea midline and thyroid not enlarged, symmetric, no tenderness/mass/nodules Back: symmetric, no curvature. ROM normal. No CVA tenderness. Lungs: clear to auscultation bilaterally Heart: regular rate and rhythm, S1, S2 normal, no murmur, click, rub or gallop Abdomen: soft, non-tender; bowel sounds normal; no masses,  no organomegaly Pulses: 2+ and symmetric Skin: Skin color, texture, turgor normal. No rashes or lesions Lymph nodes: Cervical, supraclavicular, and axillary nodes normal.  Assessment and Plan:  Osteoporosis, post-menopausal Stopping boniva for a drug free period   Diabetes mellitus type 2, uncontrolled a1c has improved to 8.4.  She is  requesting a copy of the low glycemic index diet . She was advised to reduce her insulin doses by several units to prevent hypoglycemia from occurring.    Updated Medication List Outpatient Encounter Prescriptions as of 08/03/2012  Medication Sig Dispense Refill  . acetaminophen (TYLENOL) 325 MG tablet Take 650 mg by mouth every 4 (four) hours as needed.      [name] amLODipine (NORVASC) 10 MG tablet Take 1 tablet (10 mg total) by mouth daily.  90 tablet  3  . aspirin 81 MG EC tablet Take 1 tablet (81 mg total) by mouth daily. Swallow whole.  30 tablet  12  . B-D ULTRAFINE III SHORT PEN 31G X 8 MM MISC       . eszopiclone (LUNESTA) 2 MG TABS Take 1 tablet (2 mg total) by mouth at bedtime. Take immediately before bedtime  7 tablet  0  . fluticasone (FLONASE) 50 MCG/ACT nasal spray Place 2 sprays into the nose daily.  16 g  6  . furosemide (LASIX) 20 MG tablet Take 1 tablet (20 mg total) by mouth daily.  90 tablet  3  . glucose blood test strip Check blood sugar 3 times a day DX CODE: 250.0  100 each  11  . ibandronate (BONIVA) 150 MG tablet Take 1 tablet (150 mg total) by mouth every 30 (thirty) days. Take in the morning with a full glass of water, on an empty stomach, and do not take anything else by mouth or lie down for the next 30 min.  1 tablet  11  . INS SYRINGE/NEEDLE 1CC/28G 28G X 1/2" 1 ML MISC 1 each by Does not apply route as needed. To test glucose levels daily.  200 each  3  . insulin aspart protamine-insulin aspart (NOVOLOG MIX 70/30 FLEXPEN) (70-30) 100 UNIT/ML injection Please fill using the pens.  30 units before dinner,  27 units before breakfast  Needs 18 pens for a 90 day supply   PLEASE ALSO SUPPLY NEEDLES FOR THE FLEXPEN 180/90 DAY  54 mL  3  . Ipratropium-Albuterol (COMBIVENT RESPIMAT) 20-100 MCG/ACT AERS respimat Inhale 1 puff into the lungs every 6 (six) hours.      . metFORMIN (GLUCOPHAGE) 1000 MG tablet Take 1 tablet (1,000 mg total) by mouth 2 (two) times daily with a meal.   60 tablet  3  . metoprolol tartrate (LOPRESSOR) 25 MG tablet Take 1 tablet (25 mg total) by mouth 2 (two) times daily.  180 tablet  3  . olmesartan (BENICAR) 40 MG tablet One half daily  90 tablet  3  . pregabalin (LYRICA) 100 MG capsule Take 1 capsule (100 mg total) by mouth 3 (three) times daily.  90 capsule  3  . rosuvastatin (CRESTOR) 10 MG tablet Take 1 tablet (  10 mg total) by mouth daily.  90 tablet  3  . traMADol-acetaminophen (ULTRACET) 37.5-325 MG per tablet Take 1 tablet by mouth every 6 (six) hours as needed.      . [DISCONTINUED] rosuvastatin (CRESTOR) 10 MG tablet Take 1 tablet (10 mg total) by mouth daily.  90 tablet  3  . ALPRAZolam (XANAX) 0.5 MG tablet Take 1 tablet (0.5 mg total) by mouth daily as needed for sleep or anxiety.  30 tablet  3   Facility-Administered Encounter Medications as of 08/03/2012  Medication Dose Route Frequency Provider Last Rate Last Dose  . albuterol (PROVENTIL) (5 MG/ML) 0.5% nebulizer solution 2.5 mg  2.5 mg Nebulization Once [name], MD         Orders Placed This Encounter  Procedures  . POCT glucose (manual entry)    No Follow-up on file.

## 2012-08-03 NOTE — Assessment & Plan Note (Signed)
Stopping boniva for a drug free period

## 2012-08-03 NOTE — Patient Instructions (Addendum)
This is  One version of a  "Low GI"  Diet:  It will still lower your blood sugars and allow you to lose 4 to 8  lbs  per month if you follow it carefully and combine it with 30 minutes of aerobic exercise 5 days per week .   All of the foods can be found at grocery stores and in bulk at [employer].  The Atkins protein bars and shakes are available in more varieties at [employer], [employer] and [place].     7 AM Breakfast:  Choose from the following:  Low carbohydrate Protein  Shakes (I recommend the [employer]AS AdvantEdge "Carb Control" shakes  Or the low carb shakes by Atkins.    2.5 carbs   Arnold's "Sandwhich Thin"toasted  w/ peanut butter (no jelly: about 20 net carbs  "Bagel Thin" with cream cheese and salmon: about 20 carbs   a scrambled egg/bacon/cheese burrito made with Mission's "carb balance" whole wheat tortilla  (about 10 net carbs )   Avoid cereal and bananas, oatmeal and cream of wheat and grits. They are loaded with carbohydrates!   10 AM: high protein snack  Protein bar by Atkins (the snack size, under 200 cal, usually < 6 net carbs).    A stick of cheese:  Around 1 carb,  100 cal     Dannon Light n Fit [place] Yogurt  (80 cal, 8 carbs)  Other so called "protein bars" and Greek yogurts tend to be loaded with carbohydrates.  Remember, in food advertising, the word "energy" is synonymous for " carbohydrate."  Lunch:   A Sandwich using the bread choices listed, Can use any  Eggs,  lunchmeat, grilled meat or canned tuna), avocado, regular mayo/mustard  and cheese.  A Salad using clue cheese, ranch,  Goddess or vinagrette,  No croutons or "confetti" and no "candied nuts" but regular nuts OK.   No pretzels or chips.  Pickles and miniature sweet peppers are a good low carb alternative  The bread is the only source or carbohydrate that can be decreased (Joseph's makes a pita bread and a flat bread that are 50 cal and 4 net carbs ; Toufayan makes a low carb flatbread that's 100 cal and 9 net carbs   and  Mission's carb balance whole wheat tortilla  That is 210 cal and 6 net carbs) Avoid "Low fat dressings, as well as Barry Brunner and [place] dressings They are loaded with sugar!   3 PM/ Mid day  Snack:  Consider  1 ounce of  almonds, walnuts, pistachios, pecans, peanuts,  Macadamia nuts or a nut medley.  Avoid "granola"; the dried cranberries and raisins are loaded with carbohydrates. Mixed nuts ok if no raisins or cranberries or dried fruit.     6 PM  Dinner:    "mean and green, "  Meat/chicken/fish with a green salad, and broccoli, cauliflower, green beans, spinach, brussel sprouts or  Lima beans::       There is a low carb pasta by Dreamfield's available at [employer] that is acceptable and tastes great only 5 diestible carbs/serving.   Try Michel Angelo's chicken piccata or chicken or eggplant parm over low carb pasta.   Marjory Lies Sanchez's "Carnitas" (pulled pork, no sauce,  0 carbs) or his beef pot roast to make a dinner burrito  Whole wheat pasta is still full of digestible carbs and  Not as low in glycemic index as Dreamfield's.   Brown rice is still rice,  So skip the rice and noodles if you eat [place] or [place]  9 PM snack :   Breyer's "low carb" fudgsicle or  ice cream bar (Carb Smart line), or  Weight Watcher's ice cream bar , or another "no sugar added" ice cream;  a serving of fresh berries/cherries with whipped cream   Cheese or yogurt  Avoid bananas, pineapple, grapes  and watermelon on a regular basis because they are high in sugar)   Remember that snack Substitutions should be less than 10 carbs per serving and meals < 20 carbs. Remember to subtract fiber grams to get the "net carbs."  You may need to reduce your insulin doses by 3 to 5 units if you follow this diet because it is a lot lower in carbohydrates

## 2012-08-04 ENCOUNTER — Encounter: Payer: Self-pay | Admitting: Internal Medicine

## 2012-08-17 ENCOUNTER — Other Ambulatory Visit: Payer: Self-pay | Admitting: General Practice

## 2012-08-17 MED ORDER — GLUCOSE BLOOD VI STRP: ORAL_STRIP | Status: DC

## 2012-08-17 MED ORDER — METFORMIN HCL 1000 MG PO TABS: 1000.0000 mg | ORAL_TABLET | Freq: Two times a day (BID) | ORAL | Status: DC

## 2012-08-17 MED ORDER — PREGABALIN 100 MG PO CAPS: 100.0000 mg | ORAL_CAPSULE | Freq: Three times a day (TID) | ORAL | Status: DC

## 2012-08-17 NOTE — Telephone Encounter (Signed)
Med filled.  

## 2012-08-19 ENCOUNTER — Telehealth: Payer: Self-pay | Admitting: *Deleted

## 2012-08-19 NOTE — Telephone Encounter (Signed)
Prior authorization criteria question for the lyrica have been faxed over

## 2012-08-23 ENCOUNTER — Telehealth: Payer: Self-pay | Admitting: Internal Medicine

## 2012-08-24 ENCOUNTER — Other Ambulatory Visit: Payer: Self-pay | Admitting: *Deleted

## 2012-08-24 MED ORDER — IPRATROPIUM-ALBUTEROL 20-100 MCG/ACT IN AERS: 1.0000 | INHALATION_SPRAY | Freq: Four times a day (QID) | RESPIRATORY_TRACT | Status: DC

## 2012-08-24 NOTE — Telephone Encounter (Signed)
Med filled.  

## 2012-08-26 ENCOUNTER — Other Ambulatory Visit: Payer: Self-pay | Admitting: *Deleted

## 2012-08-26 NOTE — Telephone Encounter (Signed)
Patient came in stating she had not received scripts from [facility] and needed scripts wrote for 90 day supply.

## 2012-08-27 MED ORDER — PREGABALIN 100 MG PO CAPS: 100.0000 mg | ORAL_CAPSULE | Freq: Three times a day (TID) | ORAL | Status: DC

## 2012-08-27 MED ORDER — IPRATROPIUM-ALBUTEROL 20-100 MCG/ACT IN AERS: 1.0000 | INHALATION_SPRAY | Freq: Four times a day (QID) | RESPIRATORY_TRACT | Status: DC

## 2012-08-27 MED ORDER — GLUCOSE BLOOD VI STRP: ORAL_STRIP | Status: DC

## 2012-08-27 MED ORDER — METFORMIN HCL 1000 MG PO TABS: 1000.0000 mg | ORAL_TABLET | Freq: Two times a day (BID) | ORAL | Status: DC

## 2012-08-30 ENCOUNTER — Encounter: Payer: Self-pay | Admitting: *Deleted

## 2012-08-30 DIAGNOSIS — I70219 Atherosclerosis of native arteries of extremities with intermittent claudication, unspecified extremity: Secondary | ICD-10-CM | POA: Diagnosis not present

## 2012-08-30 DIAGNOSIS — R0989 Other specified symptoms and signs involving the circulatory and respiratory systems: Secondary | ICD-10-CM | POA: Diagnosis not present

## 2012-08-30 DIAGNOSIS — I739 Peripheral vascular disease, unspecified: Secondary | ICD-10-CM | POA: Diagnosis not present

## 2012-08-30 DIAGNOSIS — I200 Unstable angina: Secondary | ICD-10-CM | POA: Diagnosis not present

## 2012-08-30 DIAGNOSIS — I059 Rheumatic mitral valve disease, unspecified: Secondary | ICD-10-CM | POA: Diagnosis not present

## 2012-08-30 DIAGNOSIS — I10 Essential (primary) hypertension: Secondary | ICD-10-CM | POA: Diagnosis not present

## 2012-08-30 DIAGNOSIS — E119 Type 2 diabetes mellitus without complications: Secondary | ICD-10-CM | POA: Diagnosis not present

## 2012-08-30 DIAGNOSIS — R0609 Other forms of dyspnea: Secondary | ICD-10-CM | POA: Diagnosis not present

## 2012-08-30 DIAGNOSIS — I2510 Atherosclerotic heart disease of native coronary artery without angina pectoris: Secondary | ICD-10-CM | POA: Diagnosis not present

## 2012-08-30 NOTE — Telephone Encounter (Signed)
Talked with patient by phone patient reported medications received.

## 2012-09-06 ENCOUNTER — Telehealth: Payer: Self-pay | Admitting: *Deleted

## 2012-09-06 ENCOUNTER — Ambulatory Visit: Payer: Self-pay | Admitting: Internal Medicine

## 2012-09-06 ENCOUNTER — Ambulatory Visit (INDEPENDENT_AMBULATORY_CARE_PROVIDER_SITE_OTHER): Payer: Medicare Other | Admitting: Internal Medicine

## 2012-09-06 ENCOUNTER — Encounter: Payer: Self-pay | Admitting: Internal Medicine

## 2012-09-06 ENCOUNTER — Inpatient Hospital Stay: Payer: Self-pay | Admitting: Specialist

## 2012-09-06 VITALS — BP 160/78 | HR 68 | Temp 97.8°F | Resp 14 | Wt 183.2 lb

## 2012-09-06 DIAGNOSIS — I359 Nonrheumatic aortic valve disorder, unspecified: Secondary | ICD-10-CM | POA: Diagnosis present

## 2012-09-06 DIAGNOSIS — R079 Chest pain, unspecified: Secondary | ICD-10-CM | POA: Diagnosis not present

## 2012-09-06 DIAGNOSIS — E1149 Type 2 diabetes mellitus with other diabetic neurological complication: Secondary | ICD-10-CM | POA: Diagnosis present

## 2012-09-06 DIAGNOSIS — Z794 Long term (current) use of insulin: Secondary | ICD-10-CM | POA: Diagnosis not present

## 2012-09-06 DIAGNOSIS — I509 Heart failure, unspecified: Secondary | ICD-10-CM | POA: Diagnosis present

## 2012-09-06 DIAGNOSIS — I4891 Unspecified atrial fibrillation: Secondary | ICD-10-CM | POA: Diagnosis not present

## 2012-09-06 DIAGNOSIS — R748 Abnormal levels of other serum enzymes: Secondary | ICD-10-CM | POA: Diagnosis not present

## 2012-09-06 DIAGNOSIS — R0609 Other forms of dyspnea: Secondary | ICD-10-CM | POA: Diagnosis not present

## 2012-09-06 DIAGNOSIS — E1142 Type 2 diabetes mellitus with diabetic polyneuropathy: Secondary | ICD-10-CM | POA: Diagnosis present

## 2012-09-06 DIAGNOSIS — N189 Chronic kidney disease, unspecified: Secondary | ICD-10-CM | POA: Diagnosis present

## 2012-09-06 DIAGNOSIS — R0602 Shortness of breath: Secondary | ICD-10-CM | POA: Diagnosis not present

## 2012-09-06 DIAGNOSIS — I129 Hypertensive chronic kidney disease with stage 1 through stage 4 chronic kidney disease, or unspecified chronic kidney disease: Secondary | ICD-10-CM | POA: Diagnosis present

## 2012-09-06 DIAGNOSIS — I5043 Acute on chronic combined systolic (congestive) and diastolic (congestive) heart failure: Secondary | ICD-10-CM | POA: Diagnosis present

## 2012-09-06 DIAGNOSIS — R0989 Other specified symptoms and signs involving the circulatory and respiratory systems: Secondary | ICD-10-CM | POA: Diagnosis not present

## 2012-09-06 DIAGNOSIS — Z8249 Family history of ischemic heart disease and other diseases of the circulatory system: Secondary | ICD-10-CM | POA: Diagnosis not present

## 2012-09-06 DIAGNOSIS — I5031 Acute diastolic (congestive) heart failure: Secondary | ICD-10-CM | POA: Diagnosis not present

## 2012-09-06 DIAGNOSIS — Z7982 Long term (current) use of aspirin: Secondary | ICD-10-CM | POA: Diagnosis not present

## 2012-09-06 DIAGNOSIS — I2510 Atherosclerotic heart disease of native coronary artery without angina pectoris: Secondary | ICD-10-CM | POA: Diagnosis not present

## 2012-09-06 DIAGNOSIS — E785 Hyperlipidemia, unspecified: Secondary | ICD-10-CM | POA: Diagnosis present

## 2012-09-06 DIAGNOSIS — I10 Essential (primary) hypertension: Secondary | ICD-10-CM | POA: Diagnosis not present

## 2012-09-06 DIAGNOSIS — M810 Age-related osteoporosis without current pathological fracture: Secondary | ICD-10-CM | POA: Diagnosis present

## 2012-09-06 DIAGNOSIS — IMO0001 Reserved for inherently not codable concepts without codable children: Secondary | ICD-10-CM | POA: Diagnosis not present

## 2012-09-06 LAB — CK TOTAL AND CKMB (NOT AT ARMC)
CK, Total: 239 U/L — ABNORMAL HIGH (ref 21–215)
CK, Total: 185 U/L (ref 21–215)
CK-MB: 3 ng/mL (ref 0.5–3.6)

## 2012-09-06 LAB — BASIC METABOLIC PANEL
Anion Gap: 9 (ref 7–16)
Calcium, Total: 9.3 mg/dL (ref 8.5–10.1)
Creatinine: 0.68 mg/dL (ref 0.60–1.30)
EGFR (African American): >60
Glucose: 142 mg/dL — ABNORMAL HIGH (ref 65–99)
Potassium: 3.4 mmol/L — ABNORMAL LOW (ref 3.5–5.1)
Sodium: 142 mmol/L (ref 136–145)

## 2012-09-06 LAB — RENAL FUNCTION PANEL
Anion Gap: 7 (ref 7–16)
BUN: 10 mg/dL (ref 7–18)
Calcium, Total: 9.2 mg/dL (ref 8.5–10.1)
Chloride: 106 mmol/L (ref 98–107)
Co2: 29 mmol/L (ref 21–32)
Creatinine: 0.75 mg/dL (ref 0.60–1.30)
EGFR (African American): >60
Glucose: 158 mg/dL — ABNORMAL HIGH (ref 65–99)
Osmolality: 285 (ref 275–301)
Phosphorus: 3 mg/dL (ref 2.5–4.9)
Potassium: 3.7 mmol/L (ref 3.5–5.1)

## 2012-09-06 LAB — CBC WITH DIFFERENTIAL/PLATELET
Eosinophil %: 1.8 %
HCT: 36.5 % (ref 35.0–47.0)
Lymphocyte #: 1.3 10*3/uL (ref 1.0–3.6)
Lymphocyte %: 22.4 %
MCHC: 33.8 g/dL (ref 32.0–36.0)
Monocyte #: 0.6 x10 3/mm (ref 0.2–0.9)
Monocyte %: 9.8 %
Platelet: 322 10*3/uL (ref 150–440)

## 2012-09-06 LAB — CBC
HCT: 38.2 % (ref 35.0–47.0)
MCV: 87 fL (ref 80–100)
Platelet: 334 10*3/uL (ref 150–440)
RBC: 4.4 10*6/uL (ref 3.80–5.20)
RDW: 14.7 % — ABNORMAL HIGH (ref 11.5–14.5)
WBC: 6.5 10*3/uL (ref 3.6–11.0)

## 2012-09-06 LAB — TROPONIN I
Troponin-I: 0.04 ng/mL
Troponin-I: 0.04 ng/mL

## 2012-09-06 LAB — PROTIME-INR: INR: 1.1

## 2012-09-06 LAB — HEMOGLOBIN A1C: Hemoglobin A1C: 8 % — ABNORMAL HIGH (ref 4.2–6.3)

## 2012-09-06 LAB — GLUCOSE, POCT (MANUAL RESULT ENTRY): POC Glucose: 157 mg/dl — AB (ref 70–99)

## 2012-09-06 LAB — CK: CK, Total: 228 U/L — ABNORMAL HIGH (ref 21–215)

## 2012-09-06 NOTE — Assessment & Plan Note (Signed)
Likely a component of diastolic and systolic given her elevated blood pressure and pleural effusion and edema on chest x-ray. Patient has an elevated BNP of 2800, positive cardiac in the index, and is dyspneic at rest and orthopneic. I am sending her the Er  for treatment and  probable admission. I have spoken to the triage nurse about her and informed her that the labs and chest x-ray were just done one hour prior to evaluation.

## 2012-09-06 NOTE — Progress Notes (Signed)
Patient ID: Pamela Collier, female   DOB: 08-01-1940, 72 y.o.   MRN: [id number]   Patient Active Problem List   Diagnosis Date Noted  . Acute exacerbation of CHF (congestive heart failure) 09/06/2012  . Respiratory failure, acute 06/17/2012  . SOB (shortness of breath) 05/27/2012  . Pulmonary nodule seen on imaging study 05/20/2012  . Heart failure, diastolic, acute [id number]  . 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 Cardiomyopathy, ischemic   . Hepatic steatosis   . Osteoporosis, post-menopausal   . 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 Atherosclerotic peripheral vascular disease with gangrene   . Diabetes mellitus type 2, uncontrolled 12/14/2011  . Double vessel coronary artery disease 12/14/2011    Subjective:  CC:   Chief Complaint  Patient presents with  . Acute Visit    can't sleep cough, SOB, direhhea , nausea loose stools    HPI:   Pamela Collier a 72 y.o. female who presents with respiratory distress.  Patient worked in acutely as a walk in with cc orthopnea and dyspnea with minimal exertion x 1 week. She saw her cardiologist [place] last monday and stress test was scheduled for May 10th.    Husband spoke with [place] this morning about her symptoms but could not been seen today.  She has a history of ischemic ardiomyopathy and COPD with several hospitalizations in the last year at [facility]  For respiratory failure,  Taking 20 mg furosemide daily.  ACE inhibitor was stopped in January and Benicar started due to persistent cough.  She returns for evaluation after being sent over to [facility] for stat cxr,  BNP , cardiac enzymes.   BNP was elevated.  2800  CKMB is elevated at 3.7 and troponin 0.04    Chest x ray shows worsening chf and  Large pleural effusion on the right.  Patient states that seh can walk only a few feet without becoming severely dyspneic,  Is dyspneic at rest, and cannot lie down due to  orthopnea.  She she has no reports of lower exam edema but feels that her abdomen is distended and has been having palpitations. Husband is very upset at patient today because she has been caring for a litter of puppies in her home (7 dogs) and the home is apparently  Reeking of the smell of dogs.    Past Medical History  Diagnosis Date  . Diabetes mellitus   . 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 Cardiomyopathy, ischemic Jan 2013    EF 35 to 40% [facility])  . Hepatic steatosis     by CT abd pelvis  . Osteoporosis, post-menopausal   . 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 Abdominal hysterectomy      at ge 39. secondary to bleeding  . Toe amputation  Sept 2012    Right 2nd toe, [place]  . Ptca  August 2012    Right Posterior tibial artery , Dew    The following portions of the patient's history were reviewed and updated as appropriate: Allergies, current medications, and problem list.    Review of Systems:   Patient denies headache, fevers,unintentional weight loss, skin rash, eye pain,  sinus congestion and sinus pain, sore throat, dysphagia,  hemoptysis , cough,  wheezing, chest pain,abdominal pain, nausea, melena, diarrhea, constipation, flank pain, dysuria, hematuria, urinary  Frequency, nocturia, numbness, tingling, seizures,  Focal weakness, Loss of consciousness,  Tremor, insomnia, depression, anxiety, and suicidal ideation.      History   Social History  . Marital Status: Married    Spouse Name: N/A    Number of Children: N/A  . Years of Education: N/A   Occupational History  . Not on file.   Social History Main Topics  . Smoking status: Never Smoker   . Smokeless tobacco: Never Used  . Alcohol Use: No  . Drug  Use: No  . Sexually Active: Yes   Other Topics Concern  . Not on file   Social History Narrative  . No narrative on file    Objective:  BP 160/78  Pulse 68  Temp(Src) 97.8 F (36.6 C) (Oral)  Resp 14  Wt 183 lb 4 oz (83.122 kg)  BMI 29.59 kg/m2  SpO2 95%  General appearance: alert, cooperative and appears stated age. She is dyspneic at rest. Ears: normal TM's and external ear canals both ears Throat: lips, mucosa, and tongue normal; teeth and gums normal Neck: no adenopathy, no carotid bruit, supple, symmetrical, trachea midline and thyroid not enlarged, symmetric, no tenderness/mass/nodules Back: symmetric, no curvature. ROM normal. No CVA tenderness. Lungs: Decreased breath sounds bilaterally, right greater than left. Heart: regular rate and rhythm, S1, S2 normal, no murmur, click, rub or gallop Abdomen: soft, non-tender; bowel sounds normal; no masses,  no organomegaly Pulses: 2+ and symmetric Skin: Skin color, texture, turgor normal. No rashes or lesions. No lower extremity edema. Lymph nodes: Cervical, supraclavicular, and axillary nodes normal.  Assessment and Plan:  Acute exacerbation of CHF (congestive heart failure) Likely a component of diastolic and systolic given her elevated blood pressure and pleural effusion and edema on chest x-ray. Patient has an elevated BNP of 2800, positive cardiac in the index, and is dyspneic at rest and orthopneic. I am sending her the Er  for treatment and  probable admission. I have spoken to the triage nurse about her and informed her that the labs and chest x-ray were just done one hour prior to evaluation.   Updated Medication List Outpatient Encounter Prescriptions as of 09/06/2012  Medication Sig Dispense Refill  . acetaminophen (TYLENOL) 325 MG tablet Take 650 mg by mouth every 4 (four) hours as needed.      . ALPRAZolam (XANAX) 0.5 MG tablet Take 1 tablet (0.5 mg total) by mouth daily as needed for sleep or anxiety.  30 tablet   3  . amLODipine (NORVASC) 10 MG tablet Take 1 tablet (10 mg total) by mouth daily.  90 tablet  3  . aspirin 81 MG EC tablet Take 1 tablet (81 mg total) by mouth daily. Swallow whole.  30 tablet  12  . B-D ULTRAFINE III SHORT PEN 31G X 8 MM MISC       . fluticasone (FLONASE) 50 MCG/ACT nasal spray Place 2 sprays into the nose daily.  16 g  6  . furosemide (LASIX) 20 MG tablet Take 1 tablet (20 mg total) by mouth daily.  90 tablet  3  . glucose blood test strip Check blood sugar 3 times a day DX CODE: 250.0  300 each  11  . ibandronate (BONIVA) 150 MG tablet Take 1 tablet (150 mg total) by mouth every 30 (thirty) days. Take in the  morning with a full glass of water, on an empty stomach, and do not take anything else by mouth or lie down for the next 30 min.  1 tablet  11  . INS SYRINGE/NEEDLE 1CC/28G 28G X 1/2" 1 ML MISC 1 each by Does not apply route as needed. To test glucose levels daily.  200 each  3  . insulin aspart protamine-insulin aspart (NOVOLOG MIX 70/30 FLEXPEN) (70-30) 100 UNIT/ML injection Please fill using the pens.  30 units before dinner,  27 units before breakfast  Needs 18 pens for a 90 day supply   PLEASE ALSO SUPPLY NEEDLES FOR THE FLEXPEN 180/90 DAY  54 mL  3  . Ipratropium-Albuterol (COMBIVENT RESPIMAT) 20-100 MCG/ACT AERS respimat Inhale 1 puff into the lungs every 6 (six) hours.  4 g  0  . isosorbide mononitrate (IMDUR) 30 MG 24 hr tablet Take 30 mg by mouth daily.      . metFORMIN (GLUCOPHAGE) 1000 MG tablet Take 1 tablet (1,000 mg total) by mouth 2 (two) times daily with a meal.  180 tablet  3  . metoprolol tartrate (LOPRESSOR) 25 MG tablet Take 1 tablet (25 mg total) by mouth 2 (two) times daily.  180 tablet  3  . olmesartan (BENICAR) 40 MG tablet One half daily  90 tablet  3  . pregabalin (LYRICA) 100 MG capsule Take 1 capsule (100 mg total) by mouth 3 (three) times daily.  270 capsule  3  . rosuvastatin (CRESTOR) 10 MG tablet Take 1 tablet (10 mg total) by mouth  daily.  90 tablet  3  . traMADol-acetaminophen (ULTRACET) 37.5-325 MG per tablet Take 1 tablet by mouth every 6 (six) hours as needed.      . eszopiclone (LUNESTA) 2 MG TABS Take 1 tablet (2 mg total) by mouth at bedtime. Take immediately before bedtime  7 tablet  0   Facility-Administered Encounter Medications as of 09/06/2012  Medication Dose Route Frequency Provider Last Rate Last Dose  . albuterol (PROVENTIL) (5 MG/ML) 0.5% nebulizer solution 2.5 mg  2.5 mg Nebulization Once [name], MD         Orders Placed This Encounter  Procedures  . POCT Glucose (CBG)    No Follow-up on file.

## 2012-09-06 NOTE — Telephone Encounter (Signed)
Pt presents to office, complaining of shortness of breath, wheezing while lying down, cough with white sputum, and weakness. Began 2 days ago. Saw Dr. Gwendolyn Lima 08/30/12 and ordered a stress test, scheduled 09/20/12. Complaining of diarrhea this am. Denies fever, aches. Denies chest pain, lower extremity swelling. Used her Combivent this am and took all prescribed meds as directed. B/P 180/80, O2 95%, pulse 75, temp 98.1. No wheezing heard while pt sitting up in chair. Dr. Derrel Nip notified, pt sent for stat chest xray and labs at [facility] and advised to return to office afterwards to be seen by Dr. Derrel Nip. Verbalized understanding, has a driver with her.

## 2012-09-07 DIAGNOSIS — I5031 Acute diastolic (congestive) heart failure: Secondary | ICD-10-CM | POA: Diagnosis not present

## 2012-09-07 DIAGNOSIS — I509 Heart failure, unspecified: Secondary | ICD-10-CM | POA: Diagnosis not present

## 2012-09-07 DIAGNOSIS — R748 Abnormal levels of other serum enzymes: Secondary | ICD-10-CM | POA: Diagnosis not present

## 2012-09-07 DIAGNOSIS — R0602 Shortness of breath: Secondary | ICD-10-CM | POA: Diagnosis not present

## 2012-09-07 DIAGNOSIS — I10 Essential (primary) hypertension: Secondary | ICD-10-CM | POA: Diagnosis not present

## 2012-09-07 DIAGNOSIS — I4891 Unspecified atrial fibrillation: Secondary | ICD-10-CM | POA: Diagnosis not present

## 2012-09-07 DIAGNOSIS — I2510 Atherosclerotic heart disease of native coronary artery without angina pectoris: Secondary | ICD-10-CM | POA: Diagnosis not present

## 2012-09-07 LAB — CBC WITH DIFFERENTIAL/PLATELET
Basophil %: 0.9 %
Eosinophil #: 0.2 10*3/uL (ref 0.0–0.7)
Eosinophil %: 2.3 %
HGB: 12.3 g/dL (ref 12.0–16.0)
MCH: 29.9 pg (ref 26.0–34.0)
MCHC: 34.3 g/dL (ref 32.0–36.0)
Monocyte #: 0.8 x10 3/mm (ref 0.2–0.9)
Neutrophil #: 4.3 10*3/uL (ref 1.4–6.5)
Neutrophil %: 66.2 %
RBC: 4.11 10*6/uL (ref 3.80–5.20)
WBC: 6.4 10*3/uL (ref 3.6–11.0)

## 2012-09-07 LAB — CLOSTRIDIUM DIFFICILE BY PCR

## 2012-09-07 LAB — BASIC METABOLIC PANEL
Anion Gap: 6 — ABNORMAL LOW (ref 7–16)
BUN: 11 mg/dL (ref 7–18)
Chloride: 105 mmol/L (ref 98–107)
Co2: 30 mmol/L (ref 21–32)
EGFR (African American): >60
Glucose: 128 mg/dL — ABNORMAL HIGH (ref 65–99)
Osmolality: 282 (ref 275–301)
Sodium: 141 mmol/L (ref 136–145)

## 2012-09-07 LAB — CK TOTAL AND CKMB (NOT AT ARMC)
CK, Total: 147 U/L (ref 21–215)
CK-MB: 1.7 ng/mL (ref 0.5–3.6)

## 2012-09-08 DIAGNOSIS — I2510 Atherosclerotic heart disease of native coronary artery without angina pectoris: Secondary | ICD-10-CM | POA: Diagnosis not present

## 2012-09-08 DIAGNOSIS — I5031 Acute diastolic (congestive) heart failure: Secondary | ICD-10-CM | POA: Diagnosis not present

## 2012-09-08 DIAGNOSIS — I509 Heart failure, unspecified: Secondary | ICD-10-CM | POA: Diagnosis not present

## 2012-09-08 DIAGNOSIS — I10 Essential (primary) hypertension: Secondary | ICD-10-CM | POA: Diagnosis not present

## 2012-09-08 DIAGNOSIS — R0602 Shortness of breath: Secondary | ICD-10-CM | POA: Diagnosis not present

## 2012-09-08 DIAGNOSIS — I4891 Unspecified atrial fibrillation: Secondary | ICD-10-CM | POA: Diagnosis not present

## 2012-09-08 LAB — BASIC METABOLIC PANEL
Anion Gap: 6 — ABNORMAL LOW (ref 7–16)
Calcium, Total: 9.1 mg/dL (ref 8.5–10.1)
Chloride: 105 mmol/L (ref 98–107)
Creatinine: 1.03 mg/dL (ref 0.60–1.30)
Potassium: 3.4 mmol/L — ABNORMAL LOW (ref 3.5–5.1)
Sodium: 140 mmol/L (ref 136–145)

## 2012-09-08 LAB — CBC WITH DIFFERENTIAL/PLATELET
Basophil #: 0.1 10*3/uL (ref 0.0–0.1)
Basophil %: 0.7 %
Eosinophil #: 0.2 10*3/uL (ref 0.0–0.7)
Eosinophil %: 2.3 %
HCT: 34.8 % — ABNORMAL LOW (ref 35.0–47.0)
HGB: 11.9 g/dL — ABNORMAL LOW (ref 12.0–16.0)
Lymphocyte %: 25.8 %
MCH: 29.9 pg (ref 26.0–34.0)
MCHC: 34.2 g/dL (ref 32.0–36.0)
MCV: 87 fL (ref 80–100)
Monocyte #: 1 x10 3/mm — ABNORMAL HIGH (ref 0.2–0.9)
Monocyte %: 15.2 %
Neutrophil %: 56 %
Platelet: 296 10*3/uL (ref 150–440)
RDW: 14.2 % (ref 11.5–14.5)
WBC: 6.9 10*3/uL (ref 3.6–11.0)

## 2012-09-09 ENCOUNTER — Telehealth: Payer: Self-pay | Admitting: Internal Medicine

## 2012-09-09 DIAGNOSIS — I4891 Unspecified atrial fibrillation: Secondary | ICD-10-CM | POA: Diagnosis not present

## 2012-09-09 DIAGNOSIS — I509 Heart failure, unspecified: Secondary | ICD-10-CM | POA: Diagnosis not present

## 2012-09-09 DIAGNOSIS — I10 Essential (primary) hypertension: Secondary | ICD-10-CM | POA: Diagnosis not present

## 2012-09-09 DIAGNOSIS — I5031 Acute diastolic (congestive) heart failure: Secondary | ICD-10-CM | POA: Diagnosis not present

## 2012-09-09 DIAGNOSIS — R0602 Shortness of breath: Secondary | ICD-10-CM | POA: Diagnosis not present

## 2012-09-09 DIAGNOSIS — I2510 Atherosclerotic heart disease of native coronary artery without angina pectoris: Secondary | ICD-10-CM | POA: Diagnosis not present

## 2012-09-09 LAB — BASIC METABOLIC PANEL
Anion Gap: 7 (ref 7–16)
BUN: 21 mg/dL — ABNORMAL HIGH (ref 7–18)
Co2: 28 mmol/L (ref 21–32)
Creatinine: 1.2 mg/dL (ref 0.60–1.30)
EGFR (African American): 52 — ABNORMAL LOW
EGFR (Non-African Amer.): 45 — ABNORMAL LOW
Glucose: 106 mg/dL — ABNORMAL HIGH (ref 65–99)
Osmolality: 279 (ref 275–301)
Sodium: 138 mmol/L (ref 136–145)

## 2012-09-09 NOTE — Telephone Encounter (Signed)
Verbal okay given for home health

## 2012-09-09 NOTE — Telephone Encounter (Signed)
Put her in a 15 minute slot next week  at the end of a day so we have 30 minutes

## 2012-09-09 NOTE — Telephone Encounter (Signed)
[place] stating pt needs hospital f/u, being discharged today.  Next available 30 min slot not until 6/2.  [facility] did not wish to schedule this far out.  Please advise when pt can be seen; will contact pt with appt.  Did request records to be faxed.

## 2012-09-09 NOTE — Telephone Encounter (Signed)
Verbal order authorized for home health

## 2012-09-09 NOTE — Telephone Encounter (Signed)
Needs verbal ok for home health just released from [facility] today.

## 2012-09-10 DIAGNOSIS — E785 Hyperlipidemia, unspecified: Secondary | ICD-10-CM | POA: Diagnosis not present

## 2012-09-10 DIAGNOSIS — I5043 Acute on chronic combined systolic (congestive) and diastolic (congestive) heart failure: Secondary | ICD-10-CM | POA: Diagnosis not present

## 2012-09-10 DIAGNOSIS — I509 Heart failure, unspecified: Secondary | ICD-10-CM | POA: Diagnosis not present

## 2012-09-10 DIAGNOSIS — I2510 Atherosclerotic heart disease of native coronary artery without angina pectoris: Secondary | ICD-10-CM | POA: Diagnosis not present

## 2012-09-10 DIAGNOSIS — M810 Age-related osteoporosis without current pathological fracture: Secondary | ICD-10-CM | POA: Diagnosis not present

## 2012-09-10 DIAGNOSIS — R279 Unspecified lack of coordination: Secondary | ICD-10-CM | POA: Diagnosis not present

## 2012-09-10 DIAGNOSIS — Z794 Long term (current) use of insulin: Secondary | ICD-10-CM | POA: Diagnosis not present

## 2012-09-10 DIAGNOSIS — E1149 Type 2 diabetes mellitus with other diabetic neurological complication: Secondary | ICD-10-CM | POA: Diagnosis not present

## 2012-09-10 DIAGNOSIS — E1142 Type 2 diabetes mellitus with diabetic polyneuropathy: Secondary | ICD-10-CM | POA: Diagnosis not present

## 2012-09-10 DIAGNOSIS — I10 Essential (primary) hypertension: Secondary | ICD-10-CM | POA: Diagnosis not present

## 2012-09-10 NOTE — Telephone Encounter (Signed)
No 15 min end of day slots available next week.  Per Larene Beach, scheduled the pt with Raquel in a 45 minute slot.  Per pt, Friday 5/9 at 10:45 was a good time for her; pt was okay with seeing Raquel.

## 2012-09-11 DIAGNOSIS — I5043 Acute on chronic combined systolic (congestive) and diastolic (congestive) heart failure: Secondary | ICD-10-CM | POA: Diagnosis not present

## 2012-09-11 DIAGNOSIS — I10 Essential (primary) hypertension: Secondary | ICD-10-CM | POA: Diagnosis not present

## 2012-09-11 DIAGNOSIS — E1142 Type 2 diabetes mellitus with diabetic polyneuropathy: Secondary | ICD-10-CM | POA: Diagnosis not present

## 2012-09-11 DIAGNOSIS — I509 Heart failure, unspecified: Secondary | ICD-10-CM | POA: Diagnosis not present

## 2012-09-11 DIAGNOSIS — R279 Unspecified lack of coordination: Secondary | ICD-10-CM | POA: Diagnosis not present

## 2012-09-11 DIAGNOSIS — E1149 Type 2 diabetes mellitus with other diabetic neurological complication: Secondary | ICD-10-CM | POA: Diagnosis not present

## 2012-09-13 ENCOUNTER — Telehealth: Payer: Self-pay | Admitting: *Deleted

## 2012-09-13 NOTE — Telephone Encounter (Signed)
Patient states she feels better but still feeling really fatigued: patient has follow-up appointment scheduled with Charolette Forward NP for Friday.

## 2012-09-14 DIAGNOSIS — E1149 Type 2 diabetes mellitus with other diabetic neurological complication: Secondary | ICD-10-CM | POA: Diagnosis not present

## 2012-09-14 DIAGNOSIS — I10 Essential (primary) hypertension: Secondary | ICD-10-CM | POA: Diagnosis not present

## 2012-09-14 DIAGNOSIS — R279 Unspecified lack of coordination: Secondary | ICD-10-CM | POA: Diagnosis not present

## 2012-09-14 DIAGNOSIS — E1142 Type 2 diabetes mellitus with diabetic polyneuropathy: Secondary | ICD-10-CM | POA: Diagnosis not present

## 2012-09-14 DIAGNOSIS — I509 Heart failure, unspecified: Secondary | ICD-10-CM | POA: Diagnosis not present

## 2012-09-14 DIAGNOSIS — I5043 Acute on chronic combined systolic (congestive) and diastolic (congestive) heart failure: Secondary | ICD-10-CM | POA: Diagnosis not present

## 2012-09-16 DIAGNOSIS — R279 Unspecified lack of coordination: Secondary | ICD-10-CM | POA: Diagnosis not present

## 2012-09-16 DIAGNOSIS — I5043 Acute on chronic combined systolic (congestive) and diastolic (congestive) heart failure: Secondary | ICD-10-CM | POA: Diagnosis not present

## 2012-09-16 DIAGNOSIS — E1149 Type 2 diabetes mellitus with other diabetic neurological complication: Secondary | ICD-10-CM | POA: Diagnosis not present

## 2012-09-16 DIAGNOSIS — I509 Heart failure, unspecified: Secondary | ICD-10-CM | POA: Diagnosis not present

## 2012-09-16 DIAGNOSIS — I10 Essential (primary) hypertension: Secondary | ICD-10-CM | POA: Diagnosis not present

## 2012-09-16 DIAGNOSIS — E1142 Type 2 diabetes mellitus with diabetic polyneuropathy: Secondary | ICD-10-CM | POA: Diagnosis not present

## 2012-09-17 ENCOUNTER — Ambulatory Visit (INDEPENDENT_AMBULATORY_CARE_PROVIDER_SITE_OTHER): Payer: Medicare Other | Admitting: Adult Health

## 2012-09-17 ENCOUNTER — Encounter: Payer: Self-pay | Admitting: Adult Health

## 2012-09-17 VITALS — BP 124/60 | HR 78 | Temp 98.1°F | Resp 14 | Wt 175.5 lb

## 2012-09-17 DIAGNOSIS — Z09 Encounter for follow-up examination after completed treatment for conditions other than malignant neoplasm: Secondary | ICD-10-CM

## 2012-09-17 NOTE — Assessment & Plan Note (Addendum)
Patient's symptoms are completely resolved. Continue lasix 20 mg bid. She has been taking 2nd dose at bedtime which is causing her to get up frequently during the night. Advised patient to take 1st dose of lasix upon awakening and 2nd dose around 1-2pm. Blood pressure is well controlled. Her blood glucose levels are stable and running between 115 - 150 with a few readings as high as 170. She reports the high readings have occurred 2/2 having dessert.   Continue home health physical therapy as ordered at discharge. Return for follow up with Dr. Derrel Nip in 1 month.

## 2012-09-17 NOTE — Patient Instructions (Addendum)
  You are doing very well.  Continue your medications just as you have been doing.   It will take some time for you to regain your strength. Please take it easy and go at your own pace.  You can take a multivitamin if you like. I would take one that does not have iron. Iron can constipate you.  You can take Vitamin D 1000 units daily for bone, cardiac and immune health.  Please make a follow up appointment with Dr. Derrel Nip to see her in one month.  Call if you have any questions or concerns.  Good luck on finding great homes for your puppies!!

## 2012-09-17 NOTE — Progress Notes (Signed)
Subjective:    Patient ID: Pamela Collier, female    DOB: [birth year], 72 y.o.   MRN: [id number]  HPI  Patient is a pleasant 72 y/o female with multiple medical problems including DM, cardiomyopathy, CAD, hepatic steatosis who presents to clinic for f/u of recent hospitalization. She was hospitalized at [facility] from 09/06/12 - 09/09/12 for congestive heart failure.  [name] is currently being followed by [facility] for physical therapy and nursing. Pertaining to her CHF symptoms she is feeling improved. Patient reports feeling tired and worn out. Appetite is slowly improving. Her blood glucose levels are fairly well controlled. She reports not remembering to check her b/p as often as she should. She is weighing daily and understands to report an overnight increase of 2 lbs or 5 lbs in 1 week.    Past Medical History  Diagnosis Date  . Diabetes mellitus   . 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 Cardiomyopathy, ischemic Jan 2013    EF 35 to 40% [facility])  . Hepatic steatosis     by CT abd pelvis  . Osteoporosis, post-menopausal   . 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 Abdominal hysterectomy      at ge 39. secondary to bleeding  . Toe amputation  Sept 2012    Right 2nd toe, Fowler  . Ptca  August 2012    Right Posterior tibial artery , Dew    Family History  Problem Relation Age of Onset  . Heart disease Father   . Heart disease Sister   . Heart disease Brother   . Asthma Grandchild   . Breast cancer Maternal Aunt      History   Social History  . Marital Status: Married    Spouse Name: N/A    Number of Children: N/A  . Years of Education: N/A   Occupational History  . Not on file.   Social History Main  Topics  . Smoking status: Never Smoker   . Smokeless tobacco: Never Used  . Alcohol Use: No  . Drug Use: No  . Sexually Active: Yes   Other Topics Concern  . Not on file   Social History Narrative  . No narrative on file      Medication List       These changes are accurate as of: 09/17/2012  6:48 PM. If you have any questions, ask your nurse or doctor.          STOP taking these medications       amLODipine 10 MG tablet  Commonly known as:  NORVASC  Stopped by:  Charolette Forward, NP     ibandronate 150 MG tablet  Commonly known as:  BONIVA  Stopped by:  Charolette Forward, NP     traMADol-acetaminophen 37.5-325 MG per tablet  Commonly known as:  ULTRACET  Stopped by:  Charolette Forward, NP      TAKE these medications       acetaminophen 325 MG tablet  Commonly known as:  TYLENOL  Take 650 mg by mouth every 4 (four) hours as needed.     ALPRAZolam 0.5 MG tablet  Commonly known as:  XANAX  Take 1 tablet (0.5 mg total) by mouth  daily as needed for sleep or anxiety.     aspirin 81 MG EC tablet  Take 1 tablet (81 mg total) by mouth daily. Swallow whole.     B-D ULTRAFINE III SHORT PEN 31G X 8 MM Misc  Generic drug:  Insulin Pen Needle     eszopiclone 2 MG Tabs  Commonly known as:  LUNESTA  Take 1 tablet (2 mg total) by mouth at bedtime. Take immediately before bedtime     fluticasone 50 MCG/ACT nasal spray  Commonly known as:  FLONASE  Place 2 sprays into the nose daily.     furosemide 20 MG tablet  Commonly known as:  LASIX  Take 20 mg by mouth 2 (two) times daily.  Changed by:  Charolette Forward, NP     glucose blood test strip  Check blood sugar 3 times a day DX CODE: 250.0     INS SYRINGE/NEEDLE 1CC/28G 28G X 1/2" 1 ML Misc  1 each by Does not apply route as needed. To test glucose levels daily.     insulin aspart protamine- aspart (70-30) 100 UNIT/ML injection  Commonly known as:  NOVOLOG MIX 70/30 FLEXPEN  Please fill using the pens.  30 units before dinner,  27 units  before breakfast    Needs 18 pens for a 90 day supply     PLEASE ALSO SUPPLY NEEDLES FOR THE FLEXPEN 180/90 DAY     Ipratropium-Albuterol 20-100 MCG/ACT Aers respimat  Commonly known as:  COMBIVENT RESPIMAT  Inhale 1 puff into the lungs every 6 (six) hours.     isosorbide mononitrate 30 MG 24 hr tablet  Commonly known as:  IMDUR  Take 30 mg by mouth daily.     metFORMIN 1000 MG tablet  Commonly known as:  GLUCOPHAGE  Take 1 tablet (1,000 mg total) by mouth 2 (two) times daily with a meal.     metoprolol 50 MG tablet  Commonly known as:  LOPRESSOR  Take 50 mg by mouth 2 (two) times daily.     olmesartan 40 MG tablet  Commonly known as:  BENICAR  One half daily     potassium chloride 10 MEQ tablet  Commonly known as:  K-DUR  Take 10 mEq by mouth daily.     pregabalin 100 MG capsule  Commonly known as:  LYRICA  Take 1 capsule (100 mg total) by mouth 3 (three) times daily.     rosuvastatin 10 MG tablet  Commonly known as:  CRESTOR  Take 1 tablet (10 mg total) by mouth daily.         Review of Systems  Constitutional: Positive for fatigue. Negative for fever, chills and appetite change.  HENT: Negative.   Eyes: Negative.   Respiratory: Negative for cough, chest tightness, shortness of breath and wheezing.   Cardiovascular: Negative for chest pain, palpitations and leg swelling.  Gastrointestinal: Negative.   Endocrine: Negative.   Genitourinary: Negative.   Musculoskeletal: Negative.   Skin: Negative.   Allergic/Immunologic: Negative.   Neurological: Positive for weakness. Negative for dizziness, numbness and headaches.  Hematological: Negative.   Psychiatric/Behavioral: Negative.      BP 124/60  Pulse 78  Temp(Src) 98.1 F (36.7 C) (Oral)  Resp 14  Wt 175 lb 8 oz (79.606 kg)  BMI 28.34 kg/m2  SpO2 97%    Objective:   Physical Exam  Constitutional: She is oriented to person, place, and time. She appears well-developed and well-nourished. No  distress.  HENT:  Head: Normocephalic and  atraumatic.  Cardiovascular: Normal rate, regular rhythm, normal heart sounds and intact distal pulses.  Exam reveals no gallop.   No murmur heard. Pulmonary/Chest: Effort normal and breath sounds normal. No respiratory distress. She has no wheezes. She has no rales. She exhibits no tenderness.  Abdominal: Soft. Bowel sounds are normal.  Musculoskeletal: She exhibits no edema.  Lymphadenopathy:    She has no cervical adenopathy.  Neurological: She is alert and oriented to person, place, and time.  Skin: Skin is warm and dry.  Psychiatric: She has a normal mood and affect. Her behavior is normal. Judgment and thought content normal.          Assessment & Plan:

## 2012-09-20 DIAGNOSIS — I509 Heart failure, unspecified: Secondary | ICD-10-CM | POA: Diagnosis not present

## 2012-09-20 DIAGNOSIS — I209 Angina pectoris, unspecified: Secondary | ICD-10-CM | POA: Diagnosis not present

## 2012-09-21 DIAGNOSIS — E1149 Type 2 diabetes mellitus with other diabetic neurological complication: Secondary | ICD-10-CM | POA: Diagnosis not present

## 2012-09-21 DIAGNOSIS — I509 Heart failure, unspecified: Secondary | ICD-10-CM | POA: Diagnosis not present

## 2012-09-21 DIAGNOSIS — E1142 Type 2 diabetes mellitus with diabetic polyneuropathy: Secondary | ICD-10-CM | POA: Diagnosis not present

## 2012-09-21 DIAGNOSIS — I5043 Acute on chronic combined systolic (congestive) and diastolic (congestive) heart failure: Secondary | ICD-10-CM | POA: Diagnosis not present

## 2012-09-21 DIAGNOSIS — R279 Unspecified lack of coordination: Secondary | ICD-10-CM | POA: Diagnosis not present

## 2012-09-21 DIAGNOSIS — I10 Essential (primary) hypertension: Secondary | ICD-10-CM | POA: Diagnosis not present

## 2012-09-22 DIAGNOSIS — E1149 Type 2 diabetes mellitus with other diabetic neurological complication: Secondary | ICD-10-CM | POA: Diagnosis not present

## 2012-09-22 DIAGNOSIS — I509 Heart failure, unspecified: Secondary | ICD-10-CM | POA: Diagnosis not present

## 2012-09-22 DIAGNOSIS — I5043 Acute on chronic combined systolic (congestive) and diastolic (congestive) heart failure: Secondary | ICD-10-CM | POA: Diagnosis not present

## 2012-09-22 DIAGNOSIS — I10 Essential (primary) hypertension: Secondary | ICD-10-CM | POA: Diagnosis not present

## 2012-09-22 DIAGNOSIS — E1142 Type 2 diabetes mellitus with diabetic polyneuropathy: Secondary | ICD-10-CM | POA: Diagnosis not present

## 2012-09-22 DIAGNOSIS — R279 Unspecified lack of coordination: Secondary | ICD-10-CM | POA: Diagnosis not present

## 2012-09-25 ENCOUNTER — Encounter: Payer: Self-pay | Admitting: Internal Medicine

## 2012-09-27 DIAGNOSIS — R0989 Other specified symptoms and signs involving the circulatory and respiratory systems: Secondary | ICD-10-CM | POA: Diagnosis not present

## 2012-09-27 DIAGNOSIS — R0609 Other forms of dyspnea: Secondary | ICD-10-CM | POA: Diagnosis not present

## 2012-09-27 DIAGNOSIS — I209 Angina pectoris, unspecified: Secondary | ICD-10-CM | POA: Diagnosis not present

## 2012-09-27 DIAGNOSIS — I5023 Acute on chronic systolic (congestive) heart failure: Secondary | ICD-10-CM | POA: Diagnosis not present

## 2012-09-27 DIAGNOSIS — I519 Heart disease, unspecified: Secondary | ICD-10-CM | POA: Diagnosis not present

## 2012-09-29 DIAGNOSIS — I509 Heart failure, unspecified: Secondary | ICD-10-CM | POA: Diagnosis not present

## 2012-09-29 DIAGNOSIS — E1149 Type 2 diabetes mellitus with other diabetic neurological complication: Secondary | ICD-10-CM | POA: Diagnosis not present

## 2012-09-29 DIAGNOSIS — I10 Essential (primary) hypertension: Secondary | ICD-10-CM | POA: Diagnosis not present

## 2012-09-29 DIAGNOSIS — I5043 Acute on chronic combined systolic (congestive) and diastolic (congestive) heart failure: Secondary | ICD-10-CM | POA: Diagnosis not present

## 2012-09-29 DIAGNOSIS — E1142 Type 2 diabetes mellitus with diabetic polyneuropathy: Secondary | ICD-10-CM | POA: Diagnosis not present

## 2012-09-29 DIAGNOSIS — R279 Unspecified lack of coordination: Secondary | ICD-10-CM | POA: Diagnosis not present

## 2012-10-01 ENCOUNTER — Telehealth: Payer: Self-pay | Admitting: *Deleted

## 2012-10-01 MED ORDER — "INSULIN SYRINGE/NEEDLE 28G X 1/2"" 1 ML MISC": 1.0000 | Status: DC | PRN

## 2012-10-01 MED ORDER — OLMESARTAN MEDOXOMIL 40 MG PO TABS: ORAL_TABLET | ORAL | Status: DC

## 2012-10-01 MED ORDER — INSULIN ASPART PROT & ASPART (70-30 MIX) 100 UNIT/ML ~~LOC~~ SUSP: SUBCUTANEOUS | Status: DC

## 2012-10-01 NOTE — Telephone Encounter (Signed)
Medication refill request for insulin and Benicar . Filled medication sent to pharmacy

## 2012-10-03 ENCOUNTER — Emergency Department: Payer: Self-pay | Admitting: Emergency Medicine

## 2012-10-03 DIAGNOSIS — E119 Type 2 diabetes mellitus without complications: Secondary | ICD-10-CM | POA: Diagnosis not present

## 2012-10-03 DIAGNOSIS — I2510 Atherosclerotic heart disease of native coronary artery without angina pectoris: Secondary | ICD-10-CM | POA: Diagnosis not present

## 2012-10-03 DIAGNOSIS — R918 Other nonspecific abnormal finding of lung field: Secondary | ICD-10-CM | POA: Diagnosis not present

## 2012-10-03 DIAGNOSIS — Z79899 Other long term (current) drug therapy: Secondary | ICD-10-CM | POA: Diagnosis not present

## 2012-10-03 DIAGNOSIS — I10 Essential (primary) hypertension: Secondary | ICD-10-CM | POA: Diagnosis not present

## 2012-10-03 DIAGNOSIS — I509 Heart failure, unspecified: Secondary | ICD-10-CM | POA: Diagnosis not present

## 2012-10-03 DIAGNOSIS — Z9861 Coronary angioplasty status: Secondary | ICD-10-CM | POA: Diagnosis not present

## 2012-10-03 DIAGNOSIS — J209 Acute bronchitis, unspecified: Secondary | ICD-10-CM | POA: Diagnosis not present

## 2012-10-05 DIAGNOSIS — I798 Other disorders of arteries, arterioles and capillaries in diseases classified elsewhere: Secondary | ICD-10-CM | POA: Diagnosis not present

## 2012-10-05 DIAGNOSIS — B351 Tinea unguium: Secondary | ICD-10-CM | POA: Diagnosis not present

## 2012-10-05 DIAGNOSIS — E1159 Type 2 diabetes mellitus with other circulatory complications: Secondary | ICD-10-CM | POA: Diagnosis not present

## 2012-10-05 DIAGNOSIS — Q828 Other specified congenital malformations of skin: Secondary | ICD-10-CM | POA: Diagnosis not present

## 2012-10-05 DIAGNOSIS — L97509 Non-pressure chronic ulcer of other part of unspecified foot with unspecified severity: Secondary | ICD-10-CM | POA: Diagnosis not present

## 2012-10-07 DIAGNOSIS — E1142 Type 2 diabetes mellitus with diabetic polyneuropathy: Secondary | ICD-10-CM | POA: Diagnosis not present

## 2012-10-07 DIAGNOSIS — I5043 Acute on chronic combined systolic (congestive) and diastolic (congestive) heart failure: Secondary | ICD-10-CM | POA: Diagnosis not present

## 2012-10-07 DIAGNOSIS — I10 Essential (primary) hypertension: Secondary | ICD-10-CM | POA: Diagnosis not present

## 2012-10-07 DIAGNOSIS — E1149 Type 2 diabetes mellitus with other diabetic neurological complication: Secondary | ICD-10-CM | POA: Diagnosis not present

## 2012-10-07 DIAGNOSIS — I509 Heart failure, unspecified: Secondary | ICD-10-CM | POA: Diagnosis not present

## 2012-10-07 DIAGNOSIS — R279 Unspecified lack of coordination: Secondary | ICD-10-CM | POA: Diagnosis not present

## 2012-10-08 DIAGNOSIS — Z09 Encounter for follow-up examination after completed treatment for conditions other than malignant neoplasm: Secondary | ICD-10-CM | POA: Diagnosis not present

## 2012-10-15 ENCOUNTER — Encounter: Payer: Self-pay | Admitting: Internal Medicine

## 2012-10-15 ENCOUNTER — Ambulatory Visit (INDEPENDENT_AMBULATORY_CARE_PROVIDER_SITE_OTHER): Payer: Medicare Other | Admitting: Internal Medicine

## 2012-10-15 VITALS — BP 173/70 | HR 67 | Temp 98.2°F | Resp 16 | Wt 179.8 lb

## 2012-10-15 DIAGNOSIS — R0989 Other specified symptoms and signs involving the circulatory and respiratory systems: Secondary | ICD-10-CM

## 2012-10-15 DIAGNOSIS — E119 Type 2 diabetes mellitus without complications: Secondary | ICD-10-CM | POA: Diagnosis not present

## 2012-10-15 DIAGNOSIS — R0602 Shortness of breath: Secondary | ICD-10-CM | POA: Diagnosis not present

## 2012-10-15 DIAGNOSIS — R911 Solitary pulmonary nodule: Secondary | ICD-10-CM

## 2012-10-15 DIAGNOSIS — J90 Pleural effusion, not elsewhere classified: Secondary | ICD-10-CM

## 2012-10-15 DIAGNOSIS — E1165 Type 2 diabetes mellitus with hyperglycemia: Secondary | ICD-10-CM

## 2012-10-15 DIAGNOSIS — R0600 Dyspnea, unspecified: Secondary | ICD-10-CM

## 2012-10-15 DIAGNOSIS — R0609 Other forms of dyspnea: Secondary | ICD-10-CM | POA: Diagnosis not present

## 2012-10-15 DIAGNOSIS — IMO0001 Reserved for inherently not codable concepts without codable children: Secondary | ICD-10-CM

## 2012-10-15 LAB — LIPID PANEL: Total CHOL/HDL Ratio: 4

## 2012-10-15 LAB — COMPREHENSIVE METABOLIC PANEL
ALT: 15 U/L (ref 0–35)
Albumin: 3.4 g/dL — ABNORMAL LOW (ref 3.5–5.2)
Alkaline Phosphatase: 42 U/L (ref 39–117)
CO2: 22 mEq/L (ref 19–32)
GFR: 80.08 mL/min (ref >60.00)
Potassium: 4.6 mEq/L (ref 3.5–5.1)
Sodium: 139 mEq/L (ref 135–145)
Total Bilirubin: 0.6 mg/dL (ref 0.3–1.2)
Total Protein: 7.3 g/dL (ref 6.0–8.3)

## 2012-10-15 LAB — BRAIN NATRIURETIC PEPTIDE: Pro B Natriuretic peptide (BNP): 344 pg/mL — ABNORMAL HIGH (ref 0.0–100.0)

## 2012-10-15 LAB — MICROALBUMIN / CREATININE URINE RATIO
Creatinine,U: 31.3 mg/dL
Microalb, Ur: 28 mg/dL — ABNORMAL HIGH (ref 0.0–1.9)

## 2012-10-15 LAB — HEMOGLOBIN A1C: Hgb A1c MFr Bld: 8 % — ABNORMAL HIGH (ref 4.6–6.5)

## 2012-10-15 MED ORDER — HYDROCODONE-HOMATROPINE 5-1.5 MG/5ML PO SYRP: 5.0000 mL | ORAL_SOLUTION | Freq: Every evening | ORAL | Status: DC | PRN

## 2012-10-15 NOTE — Assessment & Plan Note (Addendum)
A1 c has improved to 8.0  She was Directed to increase the olmesartan to 40 mg daily for bp > 130/80

## 2012-10-15 NOTE — Progress Notes (Signed)
Patient ID: Pamela Collier, female   DOB: [birth year], 72 y.o.   MRN: [id number]  Patient Active Problem List   Diagnosis Date Noted  . Hospital discharge follow-up 09/17/2012  . SOB (shortness of breath) 05/27/2012  . Pulmonary nodule seen on imaging study 05/20/2012  . Heart failure, diastolic, acute [id number]  . 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 Cardiomyopathy, ischemic   . Hepatic steatosis   . Osteoporosis, post-menopausal   . 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 Atherosclerotic peripheral vascular disease with gangrene   . Diabetes mellitus type 2, uncontrolled 12/14/2011  . Double vessel coronary artery disease 12/14/2011    Subjective:  CC:   Chief Complaint  Patient presents with  . Follow-up  . Diabetes  . Bronchitis    HPI:   Pamela Collier a 72 y.o. female who presents ER followup.  She went to ER on May 25th  In the evening due to shortness of breath.  CXR was done (reviewed on [facility]) which showed improvement in bilateral opacities noted during April 28th admission for pulmonary edema. She was treated with rx for prednisone taper and tussionex.  Symptoms of cough and wheezing have improved except at night.  Using mucinex 400 mg which has helped her produce sputum    Weighs herself daily,.  Has noticed a 3 lb wt gain.  By our records she has had a weight gain of 4 lbs since her last visit on May 9 (her hospital follow up )and reports that Dr. Clayborn Bigness had reduced her metoprolol to 25 mg  bid and er daily diuretic from  lasix from 20 bid to 20 mg daily after her stress test was normal post  hospitalization . She has normal systolic function historically. Was told the results of the stress test but can't remember , and the results are not available.    Past Medical History  Diagnosis Date  . Diabetes mellitus   . Coronary artery disease, occlusive Jan 2013    severe RCA stenosis ,   mod LAD    . Cardiomyopathy, ischemic Jan 2013    EF 35 to 40% [facility])  . Hepatic steatosis     by CT abd pelvis  . Osteoporosis, post-menopausal   . 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 Abdominal hysterectomy      at ge 39. secondary to bleeding  . Toe amputation  Sept 2012    Right 2nd toe, Fowler  . Ptca  August 2012    Right Posterior tibial artery , Dew       The following portions of the patient's history were reviewed and updated as appropriate: Allergies, current medications, and problem list.    Review of Systems:   12 Pt  review of systems was negative except those addressed in the HPI,     History   Social History  . Marital Status: Married    Spouse Name: N/A    Number of Children: N/A  . Years of Education: N/A   Occupational History  . Not on file.   Social History Main Topics  . Smoking status: Never Smoker   . Smokeless tobacco: Never Used  . Alcohol Use: No  . Drug Use: No  .  Sexually Active: Yes   Other Topics Concern  . Not on file   Social History Narrative  . No narrative on file    Objective:  BP 173/70  Pulse 67  Temp(Src) 98.2 F (36.8 C) (Oral)  Resp 16  Wt 179 lb 12 oz (81.534 kg)  BMI 29.03 kg/m2  SpO2 98%  General appearance: alert, cooperative and appears stated age Ears: normal TM's and external ear canals both ears Throat: lips, mucosa, and tongue normal; teeth and gums normal Neck: no adenopathy, no carotid bruit, supple, symmetrical, trachea midline and thyroid not enlarged, symmetric, no tenderness/mass/nodules Back: symmetric, no curvature. ROM normal. No CVA tenderness. Lungs: clear to auscultation bilaterally Heart: regular rate and rhythm, S1, S2 normal, no murmur, click, rub or  gallop Abdomen: soft, non-tender; bowel sounds normal; no masses,  no organomegaly Pulses: 2+ and symmetric Skin: Skin color, texture, turgor normal. No rashes or lesions Lymph nodes: Cervical, supraclavicular, and axillary nodes normal.  Assessment and Plan:  Diabetes mellitus type 2, uncontrolled A1 c has improved to 8.0  She was Directed to increase the olmesartan to 40 mg daily for bp > 130/80  SOB (shortness of breath) Complicated by diastolic dysfunction and bilateral pleural effusions noted dueing April 28th hospitalization 2014 with no diagnostic thoracentesis doen despite history of pulmonary nodules noted on chest CT Dec 2013.  Repeat cxr May 28th reported persistnet but improving right sided opacities.  On today's visit weight gain is noted and her diuretic had been recently decreased.  I have advised her to increased her lasix to 40 mg daily until wt is back down.  She has a pulmonary follow up this month and CT should be repeated.    Pulmonary nodule seen on imaging study Given the persistent opacities on recent chest sx ray done at [facility].  I will repeat her CT of chest prior to her follow up appt with Dr. Melvyn Novas.   A total of 40 minutes was spent with patient more than half of which was spent in counseling, reviewing records from other prviders and coordination of care.  Updated Medication List Outpatient Encounter Prescriptions as of 10/15/2012  Medication Sig Dispense Refill  . acetaminophen (TYLENOL) 325 MG tablet Take 650 mg by mouth every 4 (four) hours as needed.      . ALPRAZolam (XANAX) 0.5 MG tablet Take 1 tablet (0.5 mg total) by mouth daily as needed for sleep or anxiety.  30 tablet  3  . aspirin 81 MG EC tablet Take 1 tablet (81 mg total) by mouth daily. Swallow whole.  30 tablet  12  . B-D ULTRAFINE III SHORT PEN 31G X 8 MM MISC       . eszopiclone (LUNESTA) 2 MG TABS Take 1 tablet (2 mg total) by mouth at bedtime. Take immediately before bedtime  7 tablet  0  .  fluticasone (FLONASE) 50 MCG/ACT nasal spray Place 2 sprays into the nose daily.  16 g  6  . furosemide (LASIX) 20 MG tablet Take 20 mg by mouth daily.       [name] glucose blood test strip Check blood sugar 3 times a day DX CODE: 250.0  300 each  11  . INS SYRINGE/NEEDLE 1CC/28G 28G X 1/2" 1 ML MISC 1 each by Does not apply route as needed. To test glucose levels daily.      . insulin aspart protamine- aspart (NOVOLOG 70/30) (70-30) 100 UNIT/ML injection Please fill using the pens.  30 units before dinner,  27 units before breakfast  Needs 18 pens for a 90 day supply   PLEASE ALSO SUPPLY NEEDLES FOR THE FLEXPEN 180/90 DAY  54 mL  3  . Ipratropium-Albuterol (COMBIVENT RESPIMAT) 20-100 MCG/ACT AERS respimat Inhale 1 puff into the lungs every 6 (six) hours.  4 g  0  . isosorbide mononitrate (IMDUR) 30 MG 24 hr tablet Take 30 mg by mouth daily.      . metFORMIN (GLUCOPHAGE) 1000 MG tablet Take 1 tablet (1,000 mg total) by mouth 2 (two) times daily with a meal.  180 tablet  3  . metoprolol (LOPRESSOR) 50 MG tablet Take 25 mg by mouth 2 (two) times daily.       [name] olmesartan (BENICAR) 40 MG tablet One half daily  90 tablet  3  . potassium chloride (K-DUR) 10 MEQ tablet Take 10 mEq by mouth daily.      . pregabalin (LYRICA) 100 MG capsule Take 1 capsule (100 mg total) by mouth 3 (three) times daily.  270 capsule  3  . rosuvastatin (CRESTOR) 10 MG tablet Take 1 tablet (10 mg total) by mouth daily.  90 tablet  3  . [DISCONTINUED] INS SYRINGE/NEEDLE 1CC/28G 28G X 1/2" 1 ML MISC 1 each by Does not apply route as needed. To test glucose levels daily.  200 each  3  . azithromycin (ZITHROMAX) 250 MG tablet       . HYDROcodone-homatropine (HYCODAN) 5-1.5 MG/5ML syrup Take 5 mLs by mouth at bedtime as needed for cough.  240 mL  0  . [DISCONTINUED] HYDROcodone-homatropine (HYCODAN) 5-1.5 MG/5ML syrup        Facility-Administered Encounter Medications as of 10/15/2012  Medication Dose Route Frequency Provider Last  Rate Last Dose  . albuterol (PROVENTIL) (5 MG/ML) 0.5% nebulizer solution 2.5 mg  2.5 mg Nebulization Once [name], MD         Orders Placed This Encounter  Procedures  . CT Chest W Contrast  . Comprehensive metabolic panel  . Hemoglobin A1c  . Lipid panel  . Microalbumin / creatinine urine ratio  . B Nat Peptide    No Follow-up on file.

## 2012-10-15 NOTE — Assessment & Plan Note (Addendum)
Complicated by diastolic dysfunction and bilateral pleural effusions noted dueing April 28th hospitalization 2014 with no diagnostic thoracentesis doen despite history of pulmonary nodules noted on chest CT Dec 2013.  Repeat cxr May 28th reported persistnet but improving right sided opacities.  On today's visit weight gain is noted and her diuretic had been recently decreased.  I have advised her to increased her lasix to 40 mg daily until wt is back down.  She has a pulmonary follow up this month and CT should be repeated.

## 2012-10-15 NOTE — Patient Instructions (Addendum)
You have gained 4 lb since may 9th.  I think it is water retention   Please take an extra dose of lasix today when you get home.  And resuem the mucus relief if it helps your bronchitis   Whenever you gain 2 or more lbs overnight,  Take 2 lasix in the morning at the same time (not at night)  Until the wt returns to normal  Take an extra potassium  Chloride 10 mEq each day you take an extra lasix   Your blood pressures are elevated ,  We may see an imporvement with the extra lasix but if not < 130/80 increase the olmesartan (Benicar) to a full tablet daily   I have refilled the cough medicine  I have given you samples of crestor

## 2012-10-17 ENCOUNTER — Encounter: Payer: Self-pay | Admitting: Internal Medicine

## 2012-10-17 NOTE — Assessment & Plan Note (Signed)
Given the persistent opacities on recent chest sx ray done at [facility].  I will repeat her CT of chest prior to her follow up appt with Dr. Melvyn Novas.

## 2012-10-19 DIAGNOSIS — L97509 Non-pressure chronic ulcer of other part of unspecified foot with unspecified severity: Secondary | ICD-10-CM | POA: Diagnosis not present

## 2012-10-19 DIAGNOSIS — I739 Peripheral vascular disease, unspecified: Secondary | ICD-10-CM | POA: Diagnosis not present

## 2012-10-21 ENCOUNTER — Telehealth: Payer: Self-pay | Admitting: Internal Medicine

## 2012-10-21 NOTE — Telephone Encounter (Signed)
Please Advise

## 2012-10-21 NOTE — Telephone Encounter (Signed)
Patient stated that she has no energy and she just took her blood pressure at 3:20pm and it was 11/47 pulse was 68. Pt was also told to increase fluids and to discontinue her olmesartan and isosorbide until her bp is up to 140 then resume only the olmesartan.

## 2012-10-21 NOTE — Telephone Encounter (Signed)
No I  Have no appts today .  If she refuses to be seen by anyone else she runs the risk of falling. Have her increase her fluids and discontinue her olmesartan and Isosorbide until her blood pressure is up to 140, then resume only the olmesartan.

## 2012-10-21 NOTE — Telephone Encounter (Signed)
Patient Information:  Caller Name: Shanesha  Phone: [phone]  Patient: Pamela Collier, Pamela Collier  Gender: Female  DOB: Jun 30, 1940  Age: 72 Years  PCP: Deborra Medina (Adults only)  Office Follow Up:  Does the office need to follow up with this patient?: Yes  Instructions For The Office: PLS SEE RN NOTE  RN Note:  Pt calling w/ low BP, Left Arm BP 96/65, Right Arm 102/45 P66 w/ severe dizziness, states she feels like she'll pass out if she stands. Pt has taken all daily meds. Pt is sitting and drinking water at time of call. Attempted to reach back line, twice, vmail only, discussed w/ Resource RN at [facility], agreed w/ ED dispo, advised Pt to be seen at ED or call 911 for transport due to severe dizziness.  Pt refused ED and would like to be seen by Dr Derrel Nip.  No appts w/ Dr Derrel Nip, Pt will not see another MD.  PLEASE REVIEW W/ DR Derrel Nip AND CALL PT BACK IF MD WILL SEE PT TODAY, 6-12.  Symptoms  Reason For Call & Symptoms: Low BP 96/65 w/ lightheadedness  Reviewed Health History In EMR: Yes  Reviewed Medications In EMR: Yes  Reviewed Allergies In EMR: Yes  Reviewed Surgeries / Procedures: Yes  Date of Onset of Symptoms: 10/21/2012  Guideline(s) Used:  High Blood Pressure  Dizziness  Disposition Per Guideline:   Go to ED Now (or to Office with PCP Approval)  Reason For Disposition Reached:   Severe dizziness (e.g., unable to stand, requires support to walk, feels like passing out now)  Advice Given:  N/A  Patient Refused Recommendation:  Patient Will Follow Up With Office Later  need appt w/ PCP

## 2012-10-22 ENCOUNTER — Ambulatory Visit: Payer: Self-pay | Admitting: Internal Medicine

## 2012-10-22 DIAGNOSIS — R599 Enlarged lymph nodes, unspecified: Secondary | ICD-10-CM | POA: Diagnosis not present

## 2012-10-22 DIAGNOSIS — R911 Solitary pulmonary nodule: Secondary | ICD-10-CM | POA: Diagnosis not present

## 2012-10-22 DIAGNOSIS — J90 Pleural effusion, not elsewhere classified: Secondary | ICD-10-CM | POA: Diagnosis not present

## 2012-10-25 ENCOUNTER — Ambulatory Visit (INDEPENDENT_AMBULATORY_CARE_PROVIDER_SITE_OTHER): Payer: Medicare Other | Admitting: Pulmonary Disease

## 2012-10-25 ENCOUNTER — Ambulatory Visit: Payer: Medicare Other | Admitting: Pulmonary Disease

## 2012-10-25 ENCOUNTER — Encounter: Payer: Self-pay | Admitting: Pulmonary Disease

## 2012-10-25 VITALS — BP 104/58 | HR 65 | Temp 98.3°F | Ht 66.0 in | Wt 181.0 lb

## 2012-10-25 DIAGNOSIS — R0602 Shortness of breath: Secondary | ICD-10-CM | POA: Diagnosis not present

## 2012-10-25 DIAGNOSIS — R5383 Other fatigue: Secondary | ICD-10-CM | POA: Insufficient documentation

## 2012-10-25 DIAGNOSIS — R1319 Other dysphagia: Secondary | ICD-10-CM | POA: Diagnosis not present

## 2012-10-25 DIAGNOSIS — R05 Cough: Secondary | ICD-10-CM

## 2012-10-25 DIAGNOSIS — R059 Cough, unspecified: Secondary | ICD-10-CM | POA: Insufficient documentation

## 2012-10-25 DIAGNOSIS — J90 Pleural effusion, not elsewhere classified: Secondary | ICD-10-CM

## 2012-10-25 DIAGNOSIS — R911 Solitary pulmonary nodule: Secondary | ICD-10-CM

## 2012-10-25 DIAGNOSIS — R5381 Other malaise: Secondary | ICD-10-CM

## 2012-10-25 HISTORY — DX: Pleural effusion, not elsewhere classified: J90

## 2012-10-25 LAB — CBC WITH DIFFERENTIAL/PLATELET
Eosinophils Relative: 2.3 % (ref 0.0–5.0)
HCT: 34.4 % — ABNORMAL LOW (ref 36.0–46.0)
Hemoglobin: 11.6 g/dL — ABNORMAL LOW (ref 12.0–15.0)
Lymphocytes Relative: 18.2 % (ref 12.0–46.0)
Lymphs Abs: 1.2 10*3/uL (ref 0.7–4.0)
Monocytes Relative: 8.7 % (ref 3.0–12.0)
Neutro Abs: 4.6 10*3/uL (ref 1.4–7.7)
Platelets: 310 10*3/uL (ref 150.0–400.0)
WBC: 6.6 10*3/uL (ref 4.5–10.5)

## 2012-10-25 LAB — PROTIME-INR: Prothrombin Time: 11.3 s (ref 10.2–12.4)

## 2012-10-25 NOTE — Assessment & Plan Note (Signed)
This has greatly improved off of the ACE-Inhibitor.  However, considering the RLL scarring and her complaint of choking on foods, I question aspiration.  Plan -modified barium swallow

## 2012-10-25 NOTE — Assessment & Plan Note (Signed)
The ddx of her R effusion is transudate (heart failure related) vs an exudate from aspiration/pneumonia vs less likely malignancy. She is euvolemic on exam today, so I think that this is likely related to aspiration.    Plan: -thoracentesis with culture, cytology, standard work up for exudate -repeat Chest imaging afterwards

## 2012-10-25 NOTE — Assessment & Plan Note (Signed)
Her repeat CT scan last week did not show growth of her RML nodule, which is good. The radiologist questioned whether or not there was another nodule in the RLL.  I think that the most likely explanation here is aspiration, but she needs repeat imaging after we rid her of the pleural fluid.  Plan: -repeat CT chest in 3 months (will need to order on next visit)

## 2012-10-25 NOTE — Assessment & Plan Note (Signed)
She describes daytime somnolence and states that her husband says that she has trouble breathing while she is sleeping. This is suspicious for obstructive sleep apnea  Plan: -Polysomnogram

## 2012-10-25 NOTE — Progress Notes (Signed)
Subjective:    Patient ID: Pamela Collier, female    DOB: 20-Jul-1940, 72 y.o.   MRN: [id number]  Synopsis: This is a 72 year old female first seen by Dr. Melvyn Novas in the [facility] pulmonary office in January 2014 for pulmonary nodule and some shortness of breath. An ACE inhibitor was held during that visit and followup imaging was planned.  HPI   06/28/12 rov--Since her last visit Pamela Collier says that her shortness of breath has improved. She had cough previously but this has improved since stopping the ACE inhibitor. She continues to have a globus sensation in her throat and notes postnasal drip and sinus congestion. She was recently seen an emergency room for what sounds like a congestive heart telemetry exacerbation in the setting of her known ischemic cardiomyopathy. She is feeling much better now.   10/25/2012 routine office visit>> Pamela Collier states that her cough has improved since the last visit. However she continues to have fatigue and somnolence throughout the daytime. She states that her husband says that she has trouble breathing while she is asleep at night. She has never had a polysomnogram. She has minimal cough now. She does note some shortness of breath on exertion that has not really changed since the last visit. She had a CT scan of her chest a week ago and is here to followup on the results. She has not had recent leg swelling, fever, chills. She states that she has had trouble swallowing for quite some time. She frequently coughs while eating and chokes on food.  Past Medical History  Diagnosis Date  . Diabetes mellitus   . 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 Cardiomyopathy, ischemic Jan 2013    EF 35 to 40% [facility])  . Hepatic steatosis     by CT abd pelvis  . Osteoporosis, post-menopausal   . 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 Negative for fever and chills.  HENT: Negative for congestion, rhinorrhea and postnasal drip.   Respiratory: Positive for shortness of breath. Negative for cough and wheezing.   Cardiovascular: Positive for chest pain. Negative for palpitations and leg swelling.       Objective:   Physical Exam   Filed Vitals:   10/25/12 0957  BP: 104/58  Pulse: 65  Temp: 98.3 F (36.8 C)  TempSrc: Oral  Height: 5\' 6"  (1.676 m)  Weight: 181 lb (82.101 kg)  SpO2: 100%   Gen: well appearing, no acute distress HEENT: NCAT, EOMi, OP clear, PULM: CTA B CV: RRR, no mgr, no JVD AB: BS+, soft, nontender, no hsm Ext: warm, no edema, no clubbing, no cyanosis  January 2014 CXR [facility] reviewed by me with vascular congestion and cardiomegally, no mass.  10/22/2012 CT chest >> 11 mm right middle lobe nodule unchanged, borderline enlarged mediastinal lymphadenopathy unchanged, there is a right pleural effusion which is new, there is atelectasis in the right base     Assessment & Plan:   Pulmonary nodule seen on imaging study Her repeat CT scan last week did not show growth of her RML nodule, which is good. The radiologist questioned whether or not there was another nodule in the RLL.  I think that the most likely explanation here is  aspiration, but she needs repeat imaging after we rid her of the pleural fluid.  Plan: -repeat CT chest in 3 months (will need to order on next visit)  Pleural effusion The ddx of her R effusion is transudate (heart failure related) vs an exudate from aspiration/pneumonia vs less likely malignancy. She is euvolemic on exam today, so I think that this is likely related to aspiration.    Plan: -thoracentesis with culture, cytology, standard work up for exudate -repeat Chest imaging afterwards  Cough This has greatly improved off of the  ACE-Inhibitor.  However, considering the RLL scarring and her complaint of choking on foods, I question aspiration.  Plan -modified barium swallow  Other malaise and fatigue She describes daytime somnolence and states that her husband says that she has trouble breathing while she is sleeping. This is suspicious for obstructive sleep apnea  Plan: -Polysomnogram    Updated Medication List Outpatient Encounter Prescriptions as of 10/25/2012  Medication Sig Dispense Refill  . acetaminophen (TYLENOL) 325 MG tablet Take 650 mg by mouth every 4 (four) hours as needed.      . ALPRAZolam (XANAX) 0.5 MG tablet Take 1 tablet (0.5 mg total) by mouth daily as needed for sleep or anxiety.  30 tablet  3  . aspirin 81 MG EC tablet Take 1 tablet (81 mg total) by mouth daily. Swallow whole.  30 tablet  12  . B-D ULTRAFINE III SHORT PEN 31G X 8 MM MISC       . eszopiclone (LUNESTA) 2 MG TABS Take 1 tablet (2 mg total) by mouth at bedtime. Take immediately before bedtime  7 tablet  0  . fluticasone (FLONASE) 50 MCG/ACT nasal spray Place 2 sprays into the nose daily.  16 g  6  . furosemide (LASIX) 20 MG tablet Take 20 mg by mouth daily.       [name] glucose blood test strip Check blood sugar 3 times a day DX CODE: 250.0  300 each  11  . HYDROcodone-homatropine (HYCODAN) 5-1.5 MG/5ML syrup Take 5 mLs by mouth at bedtime as needed for cough.  240 mL  0  . INS SYRINGE/NEEDLE 1CC/28G 28G X 1/2" 1 ML MISC 1 each by Does not apply route as needed. To test glucose levels daily.      . insulin aspart protamine- aspart (NOVOLOG 70/30) (70-30) 100 UNIT/ML injection Please fill using the pens.  30 units before dinner,  27 units before breakfast  Needs 18 pens for a 90 day supply   PLEASE ALSO SUPPLY NEEDLES FOR THE FLEXPEN 180/90 DAY  54 mL  3  . Ipratropium-Albuterol (COMBIVENT RESPIMAT) 20-100 MCG/ACT AERS respimat Inhale 1 puff into the lungs every 6 (six) hours.  4 g  0  . isosorbide mononitrate (IMDUR) 30 MG 24 hr  tablet Take 30 mg by mouth daily.      . metFORMIN (GLUCOPHAGE) 1000 MG tablet Take 1 tablet (1,000 mg total) by mouth 2 (two) times daily with a meal.  180 tablet  3  . metoprolol (LOPRESSOR) 50 MG tablet Take 25 mg by mouth 2 (two) times daily.       . potassium chloride (K-DUR) 10 MEQ tablet Take 10 mEq by mouth daily.      . pregabalin (LYRICA) 100 MG capsule Take 1 capsule (100 mg total) by mouth 3 (three) times daily.  270 capsule  3  . rosuvastatin (CRESTOR) 10 MG tablet Take 1 tablet (10 mg total) by mouth daily.  90 tablet  3  . olmesartan (BENICAR) 40 MG tablet One half daily  90 tablet  3  . [DISCONTINUED] azithromycin (ZITHROMAX) 250 MG tablet        Facility-Administered Encounter Medications as of 10/25/2012  Medication Dose Route Frequency Provider Last Rate Last Dose  . albuterol (PROVENTIL) (5 MG/ML) 0.5% nebulizer solution 2.5 mg  2.5 mg Nebulization Once [name], MD

## 2012-10-25 NOTE — Patient Instructions (Addendum)
Use the inhaler with a spacer  We will arrange a sleep study in [place]  We will arrange to have the fluid drained from your lung in [place] to figure out what is going   We will also arrange for a modified barium swallow at [facility] to evaluate your choking when you eat  We will see you back in a month

## 2012-10-26 ENCOUNTER — Other Ambulatory Visit (HOSPITAL_COMMUNITY): Payer: Self-pay | Admitting: Pulmonary Disease

## 2012-10-26 DIAGNOSIS — R1310 Dysphagia, unspecified: Secondary | ICD-10-CM

## 2012-10-27 ENCOUNTER — Ambulatory Visit (HOSPITAL_COMMUNITY)
Admission: RE | Admit: 2012-10-27 | Discharge: 2012-10-27 | Disposition: A | Discharge status: Home / Self Care | Payer: Medicare Other | Source: Ambulatory Visit | Attending: Pulmonary Disease | Admitting: Pulmonary Disease

## 2012-10-27 ENCOUNTER — Ambulatory Visit (HOSPITAL_COMMUNITY)
Admission: RE | Admit: 2012-10-27 | Discharge: 2012-10-27 | Disposition: A | Discharge status: Home / Self Care | Payer: Medicare Other | Source: Ambulatory Visit | Attending: Radiology | Admitting: Radiology

## 2012-10-27 DIAGNOSIS — R05 Cough: Secondary | ICD-10-CM | POA: Insufficient documentation

## 2012-10-27 DIAGNOSIS — R1311 Dysphagia, oral phase: Secondary | ICD-10-CM | POA: Diagnosis not present

## 2012-10-27 DIAGNOSIS — R899 Unspecified abnormal finding in specimens from other organs, systems and tissues: Secondary | ICD-10-CM | POA: Diagnosis not present

## 2012-10-27 DIAGNOSIS — K7689 Other specified diseases of liver: Secondary | ICD-10-CM | POA: Diagnosis not present

## 2012-10-27 DIAGNOSIS — R059 Cough, unspecified: Secondary | ICD-10-CM | POA: Insufficient documentation

## 2012-10-27 DIAGNOSIS — I2510 Atherosclerotic heart disease of native coronary artery without angina pectoris: Secondary | ICD-10-CM | POA: Insufficient documentation

## 2012-10-27 DIAGNOSIS — S98139A Complete traumatic amputation of one unspecified lesser toe, initial encounter: Secondary | ICD-10-CM | POA: Insufficient documentation

## 2012-10-27 DIAGNOSIS — J90 Pleural effusion, not elsewhere classified: Secondary | ICD-10-CM

## 2012-10-27 DIAGNOSIS — E1351 Other specified diabetes mellitus with diabetic peripheral angiopathy without gangrene: Secondary | ICD-10-CM | POA: Diagnosis not present

## 2012-10-27 DIAGNOSIS — I798 Other disorders of arteries, arterioles and capillaries in diseases classified elsewhere: Secondary | ICD-10-CM | POA: Insufficient documentation

## 2012-10-27 DIAGNOSIS — R1310 Dysphagia, unspecified: Secondary | ICD-10-CM

## 2012-10-27 DIAGNOSIS — D72820 Lymphocytosis (symptomatic): Secondary | ICD-10-CM | POA: Insufficient documentation

## 2012-10-27 LAB — GLUCOSE, SEROUS FLUID: Glucose, Fluid: 260 mg/dL

## 2012-10-27 LAB — BODY FLUID CELL COUNT WITH DIFFERENTIAL
Neutrophil Count, Fluid: 0 % (ref 0–25)
Total Nucleated Cell Count, Fluid: 3120 cu mm — ABNORMAL HIGH (ref 0–1000)

## 2012-10-27 LAB — LACTATE DEHYDROGENASE, PLEURAL OR PERITONEAL FLUID: LD, Fluid: 87 U/L — ABNORMAL HIGH (ref 3–23)

## 2012-10-27 NOTE — Procedures (Signed)
Small right pleural effusion seen on [place] Successful US guided right thoracentesis. Yielded 290mL of cloudy yellowish/white fluid. Pt tolerated procedure well. No immediate complications.  Specimen was sent for labs. CXR ordered.  Ascencion Dike PA-C 10/27/2012 11:35 AM

## 2012-10-27 NOTE — Procedures (Signed)
Objective Swallowing Evaluation: Modified Barium Swallowing Study  Patient Details  [name] MRN: [id number] Date of Birth: [birth year]  Today's Date: 10/27/2012 Time: 1225-1301 SLP Time Calculation (min): 36 min  Past Medical History:  Past Medical History  Diagnosis Date  . Diabetes mellitus   . 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 Cardiomyopathy, ischemic Jan 2013    EF 35 to 40% [facility])  . Hepatic steatosis     by CT abd pelvis  . Osteoporosis, post-menopausal   . 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 Abdominal hysterectomy      at ge 39. secondary to bleeding  . Toe amputation  Sept 2012    Right 2nd toe, [place]  . Ptca  August 2012    Right Posterior tibial artery , [place]   HPI:  72 yo female referred by Dr Lake Bells for MBS due to pt complaint of cough and choking associated with po intake.  PMH + for DM, CAD, cardiomyopathy, hepatic steatosis, osteoporosis, PVD secondary to DM, PVD with gangrene.  Pt reports h/o difficulty eating with her bronchitis episodes due to diffiulties breathing.  Per MD note, pt with improved cough since stopped ACE inhibitor.  Pt admits to problems breating starting in [place] a few years ago.  CT Chest 10/22/12 showed right middle lobe nodule - unchanged, ? another nodule RLL vs pleural fluid.  Pt is s/p thoracentisis today and is present for MBS.  Infrequent heartburn admitted by pt for which she takes Tums, Tonic Water and/or cola.  Previously she reports taking Nexium for approx one year with improvement in symptoms.  But she was informed medicine interfered with ofther medications, therefore she was taken off Nexium.       Assessment / Plan /  Recommendation Clinical Impression  Dysphagia Diagnosis: Mild oral phase dysphagia;Suspected primary esophageal dysphagia  Clinical impression: Pt with mild oral phase dysphagia and suspected primary esophageal issues.  Pt with mild delay in oral transiting and lingual pumping with tablet and pudding.   Pudding prematurely spilled to vallecular space but swallow was timely and strong.  Pt did not aspirate or penetrate with any consistency tested.  She was noted to clear her throat during testing and had sensation of stasis in pharynx when it actually appeared in her esophagus. Consumption of water aided clearance but with appearance of minimal backflow (radiologist not present).    RSI ([place] Symptom Index) was given to pt with her scoring 45/45, which indicates high chance of pt having laryngopharyngeal reflux.  Given pt had reported improved symtpoms on Nexium previously, suspect her symptoms may be consistent with reflux/esophageal issues.    SLP educated pt to findings of MBS and provided her with compensations tips to mitigate her aspiration risk.      Treatment Recommendation    n/a   Diet Recommendation Regular;Thin liquid   Liquid Administration via: Cup;Straw Medication Administration: Whole meds with liquid Supervision: Patient able to self feed Compensations: Slow rate;Small sips/bites;Follow solids with liquid Postural Changes and/or Swallow Maneuvers: Seated upright 90 degrees;Upright 30-60 min after meal    Other  Recommendations Recommended Consults: Consider esophageal assessment Oral Care Recommendations: Oral care QID   Follow  Up Recommendations    n/a   Frequency and Duration min 1 x/week  1 week     General Date of Onset: 10/27/12 HPI: 72 yo female referred by Dr Lake Bells for MBS due to pt complaint of cough and choking associated with po intake.  PMH + for DM, CAD, cardiomyopathy, hepatic steatosis, osteoporosis, PVD secondary to DM, PVD with gangrene.  Pt reports  h/o difficulty eating with her bronchitis episodes due to diffiulties breathing.  Per MD note, pt with improved cough since stopped ACE inhibitor.  Pt admits to problems breating starting in [place] a few years ago.  CT Chest 10/22/12 showed right middle lobe nodule - unchanged, ? another nodule RLL vs pleural fluid.  Pt is s/p thoracentisis today and is present for MBS.  Infrequent heartburn admitted by pt for which she takes Tums, Tonic Water and/or cola.  Previously she reports taking Nexium for approx one year with improvement in symptoms.  But she was informed medicine interfered with ofther medications, therefore she was taken off Nexium.   Type of Study: Modified Barium Swallowing Study Reason for Referral: Objectively evaluate swallowing function Previous Swallow Assessment: none Diet Prior to this Study: Regular;Thin liquids Respiratory Status: Room air Behavior/Cognition: Alert;Cooperative;Pleasant mood Oral Cavity - Dentition: Dentures, bottom;Dentures, top Oral Motor / Sensory Function: Within functional limits Self-Feeding Abilities: Able to feed self Patient Positioning: Upright in chair Baseline Vocal Quality: Clear Volitional Cough:  (did not have pt elicit secondary to thoracentesis) Volitional Swallow: Able to elicit Anatomy: Within functional limits Pharyngeal Secretions: Not observed secondary MBS    Reason for Referral Objectively evaluate swallowing function   Oral Phase Oral Preparation/Oral Phase Oral Phase: Impaired Oral - Nectar Oral - Nectar Cup: Delayed oral transit;Lingual pumping Oral - Thin Oral - Thin Cup: Within functional limits Oral - Thin Straw: Within functional limits Oral - Solids Oral - Puree: Lingual pumping Oral - Regular: Weak lingual manipulation;Impaired mastication Oral - Multi-consistency: Delayed oral transit Oral - Pill: Delayed oral transit   Pharyngeal Phase Pharyngeal Phase Pharyngeal Phase: Impaired Pharyngeal -  Nectar Pharyngeal - Nectar Cup: Within functional limits;Premature spillage to pyriform sinuses Pharyngeal - Thin Pharyngeal - Thin Cup: Pharyngeal residue - valleculae Pharyngeal - Solids Pharyngeal - Puree: Premature spillage to valleculae Pharyngeal - Regular: Within functional limits Pharyngeal - Pill: Within functional limits  Cervical Esophageal Phase    GO    Cervical Esophageal Phase Cervical Esophageal Phase: Impaired Cervical Esophageal Phase - Comment Cervical Esophageal Comment: Pt appears to have slow clearance in distal esophagus with referrant sensation to pharynx- most notably with pureed and graham cracker bolus, water boluses aid clearance but appeared with tertiary contractions, minimal backflow.      Functional Assessment Tool Used: mbs, clinical judgement Functional Limitations: Swallowing Swallow Current Status [id number]): At least 1 percent but less than 20 percent impaired, limited or restricted Swallow Goal Status [phone]): At least 1 percent but less than 20 percent impaired, limited or restricted Swallow Discharge Status [phone]): At least 1 percent but less than 20 percent impaired, limited or restricted    Luanna Salk, [facility] SLP [phone]

## 2012-10-28 ENCOUNTER — Other Ambulatory Visit: Payer: Self-pay | Admitting: Radiology

## 2012-10-28 ENCOUNTER — Other Ambulatory Visit: Payer: Self-pay | Admitting: Pulmonary Disease

## 2012-10-28 ENCOUNTER — Telehealth: Payer: Self-pay | Admitting: Internal Medicine

## 2012-10-28 ENCOUNTER — Telehealth: Payer: Self-pay | Admitting: Pulmonary Disease

## 2012-10-28 DIAGNOSIS — R5383 Other fatigue: Secondary | ICD-10-CM

## 2012-10-28 DIAGNOSIS — R0602 Shortness of breath: Secondary | ICD-10-CM

## 2012-10-28 DIAGNOSIS — J90 Pleural effusion, not elsewhere classified: Secondary | ICD-10-CM

## 2012-10-28 DIAGNOSIS — R5381 Other malaise: Secondary | ICD-10-CM

## 2012-10-28 DIAGNOSIS — I2589 Other forms of chronic ischemic heart disease: Secondary | ICD-10-CM

## 2012-10-28 LAB — TRIGLYCERIDES, BODY FLUIDS: Triglycerides, Fluid: 219 mg/dL

## 2012-10-28 NOTE — Telephone Encounter (Signed)
I spoke to the pt's husband. I tried to look over the swallow test but I wasn't sure why a barium swallow test needed to be done.  The pt and her husband ask that Dr. Lake Bells give them a call.

## 2012-10-28 NOTE — Telephone Encounter (Signed)
I spoke with pt. She is requesting her lab results. Please advise Dr. Lake Bells thanks

## 2012-10-28 NOTE — Telephone Encounter (Signed)
I called the patient to discuss the results of the Modified Barium Swallow to explain to them that this test suggested that there may be an obstruction in her esophagus.  The best way to evaluate this is to perform a formal esophogram (DG esophagus).    Unfortunately they did not answer the phone so I left a message.  I asked to them to call triage again so we can pass along this information.   Triage, please also explain to them that I am working nights and may not be able to speak on the phone again until next week.

## 2012-10-28 NOTE — Telephone Encounter (Signed)
Pt is aware that she needs to be scheduled for a barium swallow. She agrees to this and would like this scheduled. Will be leaving for out of town in one week to [place], she will be gone until after 11/12/12.  Dr. Lake Bells - she wants to know if it will be okay to wait until then, or should she have it done before she leaves.

## 2012-10-28 NOTE — Telephone Encounter (Signed)
Message copied by Horatio Pel on Thu Oct 28, 2012  3:10 PM ------      Message from: Simonne Maffucci B      Created: Thu Oct 28, 2012  9:12 AM       L,            The swallow test results suggest that she needs a barium swallow test (dg esophagus).  Please call her to let her know this and set up.            Thanks,      B ------

## 2012-10-28 NOTE — Telephone Encounter (Signed)
See my telephone note.

## 2012-10-28 NOTE — Telephone Encounter (Signed)
Pt would like to know why BQ would like for her to take a barium swallow test.  Pt would like to know the reasons for this test, etc.  Please call.  Pt's spouse call on pt's behalf, but I spoke w/ both the patient & the spouse.  Pamela Collier

## 2012-10-29 NOTE — Telephone Encounter (Signed)
lmtcb x1 

## 2012-10-30 LAB — BODY FLUID CULTURE

## 2012-11-01 ENCOUNTER — Telehealth: Payer: Self-pay | Admitting: Pulmonary Disease

## 2012-11-01 ENCOUNTER — Encounter (HOSPITAL_BASED_OUTPATIENT_CLINIC_OR_DEPARTMENT_OTHER): Payer: Medicare Other

## 2012-11-01 ENCOUNTER — Telehealth: Payer: Self-pay | Admitting: *Deleted

## 2012-11-01 DIAGNOSIS — R1310 Dysphagia, unspecified: Secondary | ICD-10-CM

## 2012-11-01 MED ORDER — IPRATROPIUM-ALBUTEROL 20-100 MCG/ACT IN AERS: 1.0000 | INHALATION_SPRAY | Freq: Four times a day (QID) | RESPIRATORY_TRACT | Status: DC

## 2012-11-01 NOTE — Telephone Encounter (Signed)
Refill sent Escript.

## 2012-11-02 ENCOUNTER — Telehealth: Payer: Self-pay | Admitting: Pulmonary Disease

## 2012-11-02 DIAGNOSIS — R590 Localized enlarged lymph nodes: Secondary | ICD-10-CM

## 2012-11-02 DIAGNOSIS — I898 Other specified noninfective disorders of lymphatic vessels and lymph nodes: Secondary | ICD-10-CM

## 2012-11-02 NOTE — Telephone Encounter (Signed)
I spoke with her and her son at length today  Please order the esophogram as I previously indicated

## 2012-11-02 NOTE — Telephone Encounter (Signed)
PT wanted to let us know that sleep study is now scheduled for 11-18-12.  Pt also wants to know when the other swallowing study will be done.  Per phone note Dr Lake Bells wanted to order a formal esophagram.  Dr Lake Bells please advise if we need to go ahead and place order for this and do you still need to speak with pt?

## 2012-11-02 NOTE — Telephone Encounter (Signed)
I called Pamela Collier today to discuss the result of the pleural effusion analysis.  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  Taken into consideration with her mediastinal lymphadenopathy, I am concerned about the possibility of a thoracic malignancy, particularly lung cancer vs lymphoma.  She needs to have a biopsy of the mediastinal lymph nodes.  She prefers to go to [facility] so we will refer her to Dr. Genevive Bi at [facility].  Jillyn Hidden PCCM Pager: [phone] Cell: [phone] If no response, call [phone]

## 2012-11-02 NOTE — Progress Notes (Signed)
Quick Note:  Spoke with pt and notified of results per Dr. Wert. Pt verbalized understanding and denied any questions.  ______ 

## 2012-11-03 ENCOUNTER — Other Ambulatory Visit: Payer: Self-pay | Admitting: Pulmonary Disease

## 2012-11-03 DIAGNOSIS — R0989 Other specified symptoms and signs involving the circulatory and respiratory systems: Secondary | ICD-10-CM | POA: Diagnosis not present

## 2012-11-03 DIAGNOSIS — R0609 Other forms of dyspnea: Secondary | ICD-10-CM | POA: Diagnosis not present

## 2012-11-03 DIAGNOSIS — R1319 Other dysphagia: Secondary | ICD-10-CM

## 2012-11-03 DIAGNOSIS — I209 Angina pectoris, unspecified: Secondary | ICD-10-CM | POA: Diagnosis not present

## 2012-11-03 DIAGNOSIS — I2510 Atherosclerotic heart disease of native coronary artery without angina pectoris: Secondary | ICD-10-CM | POA: Diagnosis not present

## 2012-11-03 DIAGNOSIS — I10 Essential (primary) hypertension: Secondary | ICD-10-CM | POA: Diagnosis not present

## 2012-11-03 NOTE — Telephone Encounter (Signed)
Order was sent to [facility] 

## 2012-11-04 ENCOUNTER — Other Ambulatory Visit: Payer: Self-pay | Admitting: *Deleted

## 2012-11-04 ENCOUNTER — Telehealth: Payer: Self-pay | Admitting: Pulmonary Disease

## 2012-11-04 ENCOUNTER — Telehealth: Payer: Self-pay | Admitting: *Deleted

## 2012-11-04 ENCOUNTER — Ambulatory Visit: Payer: Self-pay | Admitting: Cardiothoracic Surgery

## 2012-11-04 DIAGNOSIS — I898 Other specified noninfective disorders of lymphatic vessels and lymph nodes: Secondary | ICD-10-CM | POA: Diagnosis not present

## 2012-11-04 DIAGNOSIS — J90 Pleural effusion, not elsewhere classified: Secondary | ICD-10-CM | POA: Diagnosis not present

## 2012-11-04 MED ORDER — POTASSIUM CHLORIDE ER 10 MEQ PO TBCR: 10.0000 meq | EXTENDED_RELEASE_TABLET | Freq: Every day | ORAL | Status: DC

## 2012-11-04 NOTE — Telephone Encounter (Signed)
Please tell him he can call me on Monday anytime (I'm out of town until then). Cell [phone], please give it to him

## 2012-11-04 NOTE — Telephone Encounter (Signed)
Will forward to Dr. Lake Bells so he is aware

## 2012-11-04 NOTE — Telephone Encounter (Signed)
For potassium refill sent electronically .

## 2012-11-05 NOTE — Telephone Encounter (Signed)
Spoke with Dr. Genevive Bi, Dr. Anastasia Pall cell number given to him and informed to call Monday anytime Nothing further needed at this time

## 2012-11-08 ENCOUNTER — Other Ambulatory Visit (HOSPITAL_COMMUNITY): Payer: Medicare Other

## 2012-11-09 ENCOUNTER — Ambulatory Visit: Payer: Self-pay | Admitting: Cardiothoracic Surgery

## 2012-11-18 ENCOUNTER — Ambulatory Visit: Payer: Self-pay | Admitting: Pulmonary Disease

## 2012-11-18 DIAGNOSIS — G4733 Obstructive sleep apnea (adult) (pediatric): Secondary | ICD-10-CM | POA: Diagnosis not present

## 2012-11-22 ENCOUNTER — Encounter: Payer: Self-pay | Admitting: Internal Medicine

## 2012-11-23 ENCOUNTER — Ambulatory Visit (HOSPITAL_COMMUNITY)
Admission: RE | Admit: 2012-11-23 | Discharge: 2012-11-23 | Disposition: A | Discharge status: Home / Self Care | Payer: Medicare Other | Source: Ambulatory Visit | Attending: Pulmonary Disease | Admitting: Pulmonary Disease

## 2012-11-23 DIAGNOSIS — R1319 Other dysphagia: Secondary | ICD-10-CM

## 2012-11-23 DIAGNOSIS — R1310 Dysphagia, unspecified: Secondary | ICD-10-CM | POA: Diagnosis not present

## 2012-11-23 DIAGNOSIS — K224 Dyskinesia of esophagus: Secondary | ICD-10-CM | POA: Diagnosis not present

## 2012-11-25 DIAGNOSIS — L97509 Non-pressure chronic ulcer of other part of unspecified foot with unspecified severity: Secondary | ICD-10-CM | POA: Diagnosis not present

## 2012-11-26 ENCOUNTER — Telehealth: Payer: Self-pay | Admitting: Pulmonary Disease

## 2012-11-26 NOTE — Telephone Encounter (Signed)
Oralia Rud that I will send a message to B[facility] to advise on where they are in the process. Please advise if you spoke with Dr. Genevive Bi and if there is anything we need to order for the pt. [place] Bing, CMA

## 2012-11-29 ENCOUNTER — Telehealth: Payer: Self-pay | Admitting: Pulmonary Disease

## 2012-11-29 NOTE — Telephone Encounter (Signed)
ATC x 2 and line was busy, WCB 

## 2012-12-01 NOTE — Telephone Encounter (Signed)
Spoke with pt She is requesting results of her swallow eval  I advised Dr Lake Bells out of the office until 12/06/12 and we will call her with results then  Will forward to Dr Lake Bells as reminder to call her with results, thanks

## 2012-12-06 ENCOUNTER — Telehealth: Payer: Self-pay | Admitting: Pulmonary Disease

## 2012-12-06 NOTE — Telephone Encounter (Signed)
To clarify, error in previous telephone communication from earlier today.  The patient and I discussed the barium swallow which showed some degree of esophageal dysphagia.  I explained to her that I would like for her to see GI medicine for this.  We will place a GI consult.

## 2012-12-06 NOTE — Telephone Encounter (Signed)
I called Pamela Collier to let her know that her swallowing test was normal. (DG esophagus)

## 2012-12-06 NOTE — Telephone Encounter (Signed)
I spoke with pt and she is scheduled for appt. Nothing further was needed

## 2012-12-06 NOTE — Telephone Encounter (Signed)
I did speak with him. Please have her follow up with me in [place]

## 2012-12-07 ENCOUNTER — Ambulatory Visit (INDEPENDENT_AMBULATORY_CARE_PROVIDER_SITE_OTHER): Payer: Medicare Other | Admitting: Pulmonary Disease

## 2012-12-07 ENCOUNTER — Encounter: Payer: Self-pay | Admitting: Pulmonary Disease

## 2012-12-07 ENCOUNTER — Other Ambulatory Visit: Payer: Self-pay | Admitting: Pulmonary Disease

## 2012-12-07 VITALS — BP 114/62 | HR 63 | Temp 98.6°F | Ht 67.0 in | Wt 183.0 lb

## 2012-12-07 DIAGNOSIS — D869 Sarcoidosis, unspecified: Secondary | ICD-10-CM

## 2012-12-07 DIAGNOSIS — J90 Pleural effusion, not elsewhere classified: Secondary | ICD-10-CM

## 2012-12-07 DIAGNOSIS — J99 Respiratory disorders in diseases classified elsewhere: Secondary | ICD-10-CM

## 2012-12-07 DIAGNOSIS — R1310 Dysphagia, unspecified: Secondary | ICD-10-CM | POA: Diagnosis not present

## 2012-12-07 DIAGNOSIS — R4789 Other speech disturbances: Secondary | ICD-10-CM

## 2012-12-07 DIAGNOSIS — R911 Solitary pulmonary nodule: Secondary | ICD-10-CM

## 2012-12-07 DIAGNOSIS — G4733 Obstructive sleep apnea (adult) (pediatric): Secondary | ICD-10-CM

## 2012-12-07 DIAGNOSIS — R4702 Dysphasia: Secondary | ICD-10-CM

## 2012-12-07 DIAGNOSIS — D860 Sarcoidosis of lung: Secondary | ICD-10-CM

## 2012-12-07 HISTORY — DX: Sarcoidosis of lung: D86.0

## 2012-12-07 NOTE — Assessment & Plan Note (Signed)
Think this is likely due to aspiration and possibly sarcoid. We will follow the nodule with a repeat CT scan later this year.

## 2012-12-07 NOTE — Assessment & Plan Note (Signed)
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 I think that this is likely contributing to aspiration pneumonia. Further, she has significant symptoms with the sensation of food getting stuck in her chest.  Plan: -Refer to gastroenterology

## 2012-12-07 NOTE — Assessment & Plan Note (Signed)
Part of her fatigue is likely due to obstructive sleep apnea. The polysomnogram showed an AHI of 6.1 and no central apnea or oxygen desaturation.  Plan: -Home CPAP auto titration with download

## 2012-12-07 NOTE — Patient Instructions (Signed)
We will refer you to Dr. Lynden Ang with gastroenterology here in town for your swallowing difficulty  We will order a Chest X-ray for the fluid in your lungs before the next visit  We will set up a titrating CPAP machine for you in your home  We will see you back in 2 months or sooner if needed

## 2012-12-07 NOTE — Assessment & Plan Note (Signed)
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 At this point, she feels better after having the fluid drained. We will watch for reaccumulation at this point with a repeat chest x-ray before her next visit.

## 2012-12-07 NOTE — Progress Notes (Signed)
Subjective:    Patient ID: Pamela Collier, female    DOB: 03/08/1941, 72 y.o.   MRN: [id number]  Synopsis: This is a 72 year old female first seen by Dr. Melvyn Novas in the [facility] pulmonary office in January 2014 for pulmonary nodule and some shortness of breath. An ACE inhibitor was held during that visit and followup imaging was planned.  HPI   06/28/12 rov--Since her last visit Pamela Collier says that her shortness of breath has improved. She had cough previously but this has improved since stopping the ACE inhibitor. She continues to have a globus sensation in her throat and notes postnasal drip and sinus congestion. She was recently seen an emergency room for what sounds like a congestive heart telemetry exacerbation in the setting of her known ischemic cardiomyopathy. She is feeling much better now.   10/25/2012 routine office visit>> Pamela Collier states that her cough has improved since the last visit. However she continues to have fatigue and somnolence throughout the daytime. She states that her husband says that she has trouble breathing while she is asleep at night. She has never had a polysomnogram. She has minimal cough now. She does note some shortness of breath on exertion that has not really changed since the last visit. She had a CT scan of her chest a week ago and is here to followup on the results. She has not had recent leg swelling, fever, chills. She states that she has had trouble swallowing for quite some time. She frequently coughs while eating and chokes on food.  12/07/2012 ROV >> Pamela Collier says that her swallowing has really been bothering her lately. She has food getting stuck in her chest often and sometimes feels that it is still there at night when she goes to lay down. She currently has to throw up in the evenings. Sometimes the food is digested other times it is not. Her shortness of breath is actually a little better since having the thoracentesis. She  frequently does not have much energy throughout the day. She wakes up often in the middle the night. Her sleep study showed sleep apnea. Her swelling in her legs has been at baseline. She continues to take her medicines.  Past Medical History  Diagnosis Date  . Diabetes mellitus   . 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 Cardiomyopathy, ischemic Jan 2013    EF 35 to 40% [facility])  . Hepatic steatosis     by CT abd pelvis  . Osteoporosis, post-menopausal   . 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 Negative for fever and chills.  HENT: Negative for congestion, rhinorrhea and postnasal drip.   Respiratory: Positive for shortness of breath. Negative for cough and wheezing.   Cardiovascular: Positive for chest pain. Negative for palpitations and leg swelling.       Objective:   Physical Exam   Filed Vitals:   12/07/12 1000  BP: 114/62  Pulse: 63  Temp: 98.6 F (37 C)  TempSrc: Oral  Height: 5\' 7"  (1.702 m)  Weight: 183 lb (83.008 kg)  SpO2: 99%   Gen: well appearing, no acute distress HEENT: NCAT, EOMi, OP clear, PULM: CTA B CV: RRR, no mgr, no JVD AB: BS+, soft, nontender,  no hsm Ext: warm, no edema, no clubbing, no cyanosis  January 2014 CXR [facility] reviewed by me with vascular congestion and cardiomegally, no mass.  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 This likely is a unifying diagnosis  for her mediastinal lymphadenopathy and Chylothorax. Fortunately, images from [facility] did not show increase size in the lymphadenopathy in her chest.  At this point, there is no clear indication for prednisone as it would likely contribute to worsening diabetes control.  Plan: -Pulmonary function testing with next visit  Pulmonary nodule seen on imaging study Think this is likely due to aspiration and possibly sarcoid. We will follow the nodule with a repeat CT scan later this year.  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 At this point, she feels better after having the fluid drained. We will watch for reaccumulation at this point with a repeat chest x-ray before her next visit.  OSA (obstructive sleep apnea)  Part of her fatigue is likely due to obstructive sleep apnea. The polysomnogram showed an AHI of 6.1 and no central apnea or oxygen desaturation.  Plan: -Home CPAP auto titration with download  Dysphasia The esophageal dysmotility and stricture concern me. I think that this is likely contributing to aspiration pneumonia. Further, she has significant symptoms with the sensation of food getting stuck in her chest.  Plan: -Refer to gastroenterology    Updated Medication List Outpatient Encounter Prescriptions as of 12/07/2012  Medication Sig Dispense Refill  . acetaminophen (TYLENOL) 325 MG tablet Take 650 mg by mouth every 4 (four) hours as needed.      . ALPRAZolam (XANAX) 0.5 MG tablet Take 1 tablet (0.5 mg total) by mouth daily as needed for sleep or anxiety.  30 tablet  3  . aspirin 81 MG EC tablet Take 1 tablet (81 mg total) by mouth daily. Swallow whole.  30 tablet  12  . B-D ULTRAFINE III SHORT PEN 31G X 8 MM MISC       . eszopiclone (LUNESTA) 2 MG TABS Take 1 tablet (2 mg total) by mouth at bedtime. Take immediately before bedtime  7 tablet  0  . fluticasone (FLONASE) 50 MCG/ACT nasal spray Place  2 sprays into the nose daily.  16 g  6  . furosemide (LASIX) 20 MG tablet Take 20 mg by mouth daily.       [name] glucose blood test strip Check blood sugar 3 times a day DX CODE: 250.0  300 each  11  . HYDROcodone-homatropine (HYCODAN) 5-1.5 MG/5ML syrup Take 5 mLs by mouth at bedtime as needed for cough.  240 mL  0  . INS SYRINGE/NEEDLE 1CC/28G 28G X 1/2" 1 ML MISC 1 each by Does not apply route as needed. To test glucose levels daily.      . insulin aspart protamine- aspart (NOVOLOG 70/30) (70-30) 100 UNIT/ML injection Please fill using the pens.  30 units before dinner,  27 units before breakfast  Needs 18 pens for a 90 day supply   PLEASE ALSO SUPPLY NEEDLES FOR THE FLEXPEN 180/90 DAY  54 mL  3  . Ipratropium-Albuterol (COMBIVENT RESPIMAT) 20-100 MCG/ACT  AERS respimat Inhale 1 puff into the lungs every 6 (six) hours.  4 g  0  . isosorbide mononitrate (IMDUR) 30 MG 24 hr tablet Take 30 mg by mouth daily.      . metFORMIN (GLUCOPHAGE) 1000 MG tablet Take 1 tablet (1,000 mg total) by mouth 2 (two) times daily with a meal.  180 tablet  3  . metoprolol (LOPRESSOR) 50 MG tablet Take 25 mg by mouth 2 (two) times daily.       [name] olmesartan (BENICAR) 40 MG tablet One half daily  90 tablet  3  . Phenylephrine-DM-GG-APAP 5-10-200-325 MG TABS Per bottle directions as needed      . potassium chloride (K-DUR) 10 MEQ tablet Take 1 tablet (10 mEq total) by mouth daily.  30 tablet  5  . pregabalin (LYRICA) 100 MG capsule Take 1 capsule (100 mg total) by mouth 3 (three) times daily.  270 capsule  3  . rosuvastatin (CRESTOR) 10 MG tablet Take 1 tablet (10 mg total) by mouth daily.  90 tablet  3   Facility-Administered Encounter Medications as of 12/07/2012  Medication Dose Route Frequency Provider Last Rate Last Dose  . albuterol (PROVENTIL) (5 MG/ML) 0.5% nebulizer solution 2.5 mg  2.5 mg Nebulization Once [name], MD

## 2012-12-07 NOTE — Assessment & Plan Note (Signed)
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 This likely is a unifying diagnosis for her mediastinal lymphadenopathy and Chylothorax. Fortunately, images from [facility] did not show increase size in the lymphadenopathy in her chest.  At this point, there is no clear indication for prednisone as it would likely contribute to worsening diabetes control.  Plan: -Pulmonary function testing with next visit

## 2012-12-13 NOTE — Telephone Encounter (Signed)
Per phone msg from 12/06/12, BQ spoke with pt to advice of swallow test results. Will close msg.

## 2012-12-21 ENCOUNTER — Telehealth: Payer: Self-pay | Admitting: Pulmonary Disease

## 2012-12-21 NOTE — Telephone Encounter (Signed)
Pt has an appointment with Dr. Rodman Key at [facility] in [place]. There number is [phone]. This # has been given to the pt to call to reschedule her appointment.

## 2012-12-23 DIAGNOSIS — R109 Unspecified abdominal pain: Secondary | ICD-10-CM | POA: Diagnosis not present

## 2012-12-23 DIAGNOSIS — R1310 Dysphagia, unspecified: Secondary | ICD-10-CM | POA: Diagnosis not present

## 2013-01-11 ENCOUNTER — Ambulatory Visit: Payer: Self-pay | Admitting: Gastroenterology

## 2013-01-11 DIAGNOSIS — K229 Disease of esophagus, unspecified: Secondary | ICD-10-CM | POA: Diagnosis not present

## 2013-01-11 DIAGNOSIS — R933 Abnormal findings on diagnostic imaging of other parts of digestive tract: Secondary | ICD-10-CM | POA: Diagnosis not present

## 2013-01-11 DIAGNOSIS — I10 Essential (primary) hypertension: Secondary | ICD-10-CM | POA: Diagnosis not present

## 2013-01-11 DIAGNOSIS — R1310 Dysphagia, unspecified: Secondary | ICD-10-CM | POA: Diagnosis not present

## 2013-01-11 DIAGNOSIS — K2940 Chronic atrophic gastritis without bleeding: Secondary | ICD-10-CM | POA: Diagnosis not present

## 2013-01-11 DIAGNOSIS — E7800 Pure hypercholesterolemia, unspecified: Secondary | ICD-10-CM | POA: Diagnosis not present

## 2013-01-11 DIAGNOSIS — Z882 Allergy status to sulfonamides status: Secondary | ICD-10-CM | POA: Diagnosis not present

## 2013-01-11 DIAGNOSIS — R197 Diarrhea, unspecified: Secondary | ICD-10-CM | POA: Diagnosis not present

## 2013-01-11 DIAGNOSIS — Z79899 Other long term (current) drug therapy: Secondary | ICD-10-CM | POA: Diagnosis not present

## 2013-01-11 DIAGNOSIS — E669 Obesity, unspecified: Secondary | ICD-10-CM | POA: Diagnosis not present

## 2013-01-11 DIAGNOSIS — Z7982 Long term (current) use of aspirin: Secondary | ICD-10-CM | POA: Diagnosis not present

## 2013-01-11 DIAGNOSIS — I2510 Atherosclerotic heart disease of native coronary artery without angina pectoris: Secondary | ICD-10-CM | POA: Diagnosis not present

## 2013-01-11 DIAGNOSIS — M810 Age-related osteoporosis without current pathological fracture: Secondary | ICD-10-CM | POA: Diagnosis not present

## 2013-01-11 DIAGNOSIS — Z794 Long term (current) use of insulin: Secondary | ICD-10-CM | POA: Diagnosis not present

## 2013-01-11 DIAGNOSIS — Z9861 Coronary angioplasty status: Secondary | ICD-10-CM | POA: Diagnosis not present

## 2013-01-11 DIAGNOSIS — R1033 Periumbilical pain: Secondary | ICD-10-CM | POA: Diagnosis not present

## 2013-01-11 DIAGNOSIS — E119 Type 2 diabetes mellitus without complications: Secondary | ICD-10-CM | POA: Diagnosis not present

## 2013-01-13 LAB — PATHOLOGY REPORT

## 2013-01-17 DIAGNOSIS — E11319 Type 2 diabetes mellitus with unspecified diabetic retinopathy without macular edema: Secondary | ICD-10-CM | POA: Diagnosis not present

## 2013-01-17 DIAGNOSIS — E1139 Type 2 diabetes mellitus with other diabetic ophthalmic complication: Secondary | ICD-10-CM | POA: Diagnosis not present

## 2013-01-18 ENCOUNTER — Encounter: Payer: Self-pay | Admitting: Internal Medicine

## 2013-01-19 ENCOUNTER — Telehealth: Payer: Self-pay | Admitting: Internal Medicine

## 2013-01-19 DIAGNOSIS — R4702 Dysphasia: Secondary | ICD-10-CM

## 2013-01-31 ENCOUNTER — Encounter: Payer: Self-pay | Admitting: Gastroenterology

## 2013-01-31 ENCOUNTER — Encounter: Payer: Self-pay | Admitting: Internal Medicine

## 2013-02-01 LAB — HM DIABETES EYE EXAM: HM Diabetic Eye Exam: NORMAL

## 2013-02-03 ENCOUNTER — Telehealth: Payer: Self-pay | Admitting: Internal Medicine

## 2013-02-03 ENCOUNTER — Ambulatory Visit: Payer: Self-pay | Admitting: Adult Health

## 2013-02-03 ENCOUNTER — Encounter: Payer: Self-pay | Admitting: Internal Medicine

## 2013-02-03 ENCOUNTER — Ambulatory Visit (INDEPENDENT_AMBULATORY_CARE_PROVIDER_SITE_OTHER): Payer: Medicare Other | Admitting: Internal Medicine

## 2013-02-03 VITALS — BP 142/68 | HR 62 | Temp 98.2°F | Wt 185.0 lb

## 2013-02-03 DIAGNOSIS — E1165 Type 2 diabetes mellitus with hyperglycemia: Secondary | ICD-10-CM

## 2013-02-03 DIAGNOSIS — Z23 Encounter for immunization: Secondary | ICD-10-CM | POA: Diagnosis not present

## 2013-02-03 DIAGNOSIS — IMO0001 Reserved for inherently not codable concepts without codable children: Secondary | ICD-10-CM

## 2013-02-03 DIAGNOSIS — N390 Urinary tract infection, site not specified: Secondary | ICD-10-CM

## 2013-02-03 LAB — COMPREHENSIVE METABOLIC PANEL
AST: 17 U/L (ref 0–37)
Albumin: 3.6 g/dL (ref 3.5–5.2)
Alkaline Phosphatase: 45 U/L (ref 39–117)
BUN: 13 mg/dL (ref 6–23)
Glucose, Bld: 125 mg/dL — ABNORMAL HIGH (ref 70–99)
Potassium: 4.6 mEq/L (ref 3.5–5.1)
Sodium: 138 mEq/L (ref 135–145)
Total Bilirubin: 1 mg/dL (ref 0.3–1.2)
Total Protein: 6.7 g/dL (ref 6.0–8.3)

## 2013-02-03 LAB — POCT URINALYSIS DIPSTICK
Bilirubin, UA: NEGATIVE
Glucose, UA: NEGATIVE
Nitrite, UA: NEGATIVE
Spec Grav, UA: 1.02
Urobilinogen, UA: 0.2

## 2013-02-03 LAB — HEMOGLOBIN A1C: Hgb A1c MFr Bld: 8.1 % — ABNORMAL HIGH (ref 4.6–6.5)

## 2013-02-03 LAB — MICROALBUMIN / CREATININE URINE RATIO
Microalb Creat Ratio: 100.4 mg/g — ABNORMAL HIGH (ref 0.0–30.0)
Microalb, Ur: 104.8 mg/dL — ABNORMAL HIGH (ref 0.0–1.9)

## 2013-02-03 MED ORDER — CIPROFLOXACIN HCL 500 MG PO TABS: 500.0000 mg | ORAL_TABLET | Freq: Two times a day (BID) | ORAL | Status: DC

## 2013-02-03 NOTE — Telephone Encounter (Signed)
Appt with Dr. Gilford Rile

## 2013-02-03 NOTE — Progress Notes (Signed)
Subjective:    Patient ID: Pamela Collier, female    DOB: [birth year], 72 y.o.   MRN: [id number]  HPI 72 year old female with history of diabetes, hypertension, coronary artery disease presents for acute visit complaining of approximately one-week of intermittent suprapubic pain with urination and "bubbling" in her urine. She denies any fever, chills, flank pain, hematuria. She has mild increase in urinary frequency and urgency. She has not been taking anything for her symptoms.  In regards to diabetes, she reports that blood sugars have generally been well-controlled. She reports compliance with her medication. She did not bring record of blood sugars today.  Outpatient Encounter Prescriptions as of 02/03/2013  Medication Sig Dispense Refill  . acetaminophen (TYLENOL) 325 MG tablet Take 650 mg by mouth every 4 (four) hours as needed.      . ALPRAZolam (XANAX) 0.5 MG tablet Take 1 tablet (0.5 mg total) by mouth daily as needed for sleep or anxiety.  30 tablet  3  . aspirin 81 MG EC tablet Take 1 tablet (81 mg total) by mouth daily. Swallow whole.  30 tablet  12  . B-D ULTRAFINE III SHORT PEN 31G X 8 MM MISC       . eszopiclone (LUNESTA) 2 MG TABS Take 1 tablet (2 mg total) by mouth at bedtime. Take immediately before bedtime  7 tablet  0  . fluticasone (FLONASE) 50 MCG/ACT nasal spray Place 2 sprays into the nose daily.  16 g  6  . furosemide (LASIX) 20 MG tablet Take 20 mg by mouth daily.       [name] glucose blood test strip Check blood sugar 3 times a day DX CODE: 250.0  300 each  11  . HYDROcodone-homatropine (HYCODAN) 5-1.5 MG/5ML syrup Take 5 mLs by mouth at bedtime as needed for cough.  240 mL  0  . INS SYRINGE/NEEDLE 1CC/28G 28G X 1/2" 1 ML MISC 1 each by Does not apply route as needed. To test glucose levels daily.      . insulin aspart protamine- aspart (NOVOLOG 70/30) (70-30) 100 UNIT/ML injection Please fill using the pens.  30 units before dinner,  27 units before breakfast  Needs 18  pens for a 90 day supply   PLEASE ALSO SUPPLY NEEDLES FOR THE FLEXPEN 180/90 DAY  54 mL  3  . Ipratropium-Albuterol (COMBIVENT RESPIMAT) 20-100 MCG/ACT AERS respimat Inhale 1 puff into the lungs every 6 (six) hours.  4 g  0  . isosorbide mononitrate (IMDUR) 30 MG 24 hr tablet Take 30 mg by mouth daily.      . metFORMIN (GLUCOPHAGE) 1000 MG tablet Take 1 tablet (1,000 mg total) by mouth 2 (two) times daily with a meal.  180 tablet  3  . metoprolol (LOPRESSOR) 50 MG tablet Take 25 mg by mouth 2 (two) times daily.       [name] olmesartan (BENICAR) 40 MG tablet One half daily  90 tablet  3  . Phenylephrine-DM-GG-APAP 5-10-200-325 MG TABS Per bottle directions as needed      . pregabalin (LYRICA) 100 MG capsule Take 1 capsule (100 mg total) by mouth 3 (three) times daily.  270 capsule  3  . rosuvastatin (CRESTOR) 10 MG tablet Take 1 tablet (10 mg total) by mouth daily.  90 tablet  3  . potassium chloride (K-DUR) 10 MEQ tablet Take 1 tablet (10 mEq total) by mouth daily.  30 tablet  5    BP 142/68  Pulse 62  Temp(Src) 98.2 F (36.8  C) (Oral)  Wt 185 lb (83.915 kg)  BMI 28.97 kg/m2  SpO2 98%  Review of Systems  Constitutional: Negative for fever, chills and fatigue.  Gastrointestinal: Negative for nausea, vomiting, abdominal pain, diarrhea, constipation and rectal pain.  Genitourinary: Positive for dysuria, urgency and frequency. Negative for hematuria, flank pain, decreased urine volume, vaginal bleeding, vaginal discharge, difficulty urinating, vaginal pain and pelvic pain.       Objective:   Physical Exam  Constitutional: She is oriented to person, place, and time. She appears well-developed and well-nourished. No distress.  HENT:  Head: Normocephalic and atraumatic.  Right Ear: External ear normal.  Left Ear: External ear normal.  Nose: Nose normal.  Mouth/Throat: Oropharynx is clear and moist.  Eyes: Conjunctivae are normal. Pupils are equal, round, and reactive to light. Right eye  exhibits no discharge. Left eye exhibits no discharge. No scleral icterus.  Neck: Normal range of motion. Neck supple. No tracheal deviation present. No thyromegaly present.  Cardiovascular: Normal rate, regular rhythm, normal heart sounds and intact distal pulses.  Exam reveals no gallop and no friction rub.   No murmur heard. Pulmonary/Chest: Effort normal and breath sounds normal. No accessory muscle usage. Not tachypneic. No respiratory distress. She has no decreased breath sounds. She has no wheezes. She has no rhonchi. She has no rales. She exhibits no tenderness.  Abdominal: There is no tenderness (no CVA tenderness).  Musculoskeletal: Normal range of motion. She exhibits no edema and no tenderness.  Lymphadenopathy:    She has no cervical adenopathy.  Neurological: She is alert and oriented to person, place, and time. No cranial nerve deficit. She exhibits normal muscle tone. Coordination normal.  Skin: Skin is warm and dry. No rash noted. She is not diaphoretic. No erythema. No pallor.  Psychiatric: She has a normal mood and affect. Her behavior is normal. Judgment and thought content normal.          Assessment & Plan:

## 2013-02-03 NOTE — Assessment & Plan Note (Addendum)
Symptoms and urinalysis are most consistent with urinary tract infection. Will send urine for culture. Will start Cipro. "Bubbling" is atypical for UTI and protein level on urine dipstick elevated. Question if she may have progressive proteinuria related to DM. Will check CMP, A1c and urine microalbumin with labs today.  Patient will call if symptoms are not improving. Followup next week.

## 2013-02-03 NOTE — Telephone Encounter (Signed)
Patient Information:  Caller [name]  Phone: [phone]  Patient: Pamela Collier, Pamela Collier  Gender: Female  DOB: 03-Feb-1941  Age: 72 Years  PCP: Pamela Collier (Adults only)  Office Follow Up:  Does the office need to follow up with this patient?: No  Instructions For The Office: N/A  RN Note:  Mild right flank pain present.  Symptoms  Reason For Call & Symptoms: Urinary frequency with bubbles in urine. Denies dysuria or urgency. FBS not tested yet.  FBS 136 02/02/13.  Reviewed Health History In EMR: Yes  Reviewed Medications In EMR: Yes  Reviewed Allergies In EMR: Yes  Reviewed Surgeries / Procedures: Yes  Date of Onset of Symptoms: 02/01/2013  Guideline(s) Used:  Urination Pain - Female  Disposition Per Guideline:   Go to Office Now  Reason For Disposition Reached:   Side (flank) or lower back pain present  Advice Given:  Fluids:   Drink extra fluids. Drink 8-10 glasses of liquids a day (Reason: to produce a dilute, non-irritating urine).  Call Back If:  You become worse.  Patient Will Follow Care Advice:  YES  Appointment Scheduled:  02/03/2013 12:15:00 Appointment Scheduled Provider:  Ronette Collier (Adults only)

## 2013-02-03 NOTE — Assessment & Plan Note (Signed)
Patient is due for routine labs to monitor blood sugars including A1c, CMP, and urine microalbumin. Will check labs today and have her followup with her primary care physician next week.

## 2013-02-07 ENCOUNTER — Encounter: Payer: Self-pay | Admitting: *Deleted

## 2013-02-08 ENCOUNTER — Ambulatory Visit: Payer: Medicare Other | Admitting: Pulmonary Disease

## 2013-02-08 ENCOUNTER — Ambulatory Visit (INDEPENDENT_AMBULATORY_CARE_PROVIDER_SITE_OTHER): Payer: Medicare Other | Admitting: Adult Health

## 2013-02-08 ENCOUNTER — Encounter: Payer: Self-pay | Admitting: Adult Health

## 2013-02-08 VITALS — BP 140/64 | HR 63 | Temp 98.2°F | Resp 12 | Wt 185.0 lb

## 2013-02-08 DIAGNOSIS — R3129 Other microscopic hematuria: Secondary | ICD-10-CM

## 2013-02-08 DIAGNOSIS — K219 Gastro-esophageal reflux disease without esophagitis: Secondary | ICD-10-CM | POA: Diagnosis not present

## 2013-02-08 DIAGNOSIS — N390 Urinary tract infection, site not specified: Secondary | ICD-10-CM | POA: Diagnosis not present

## 2013-02-08 DIAGNOSIS — R1310 Dysphagia, unspecified: Secondary | ICD-10-CM | POA: Diagnosis not present

## 2013-02-08 LAB — POCT URINALYSIS DIPSTICK
Bilirubin, UA: NEGATIVE
Ketones, UA: NEGATIVE
Protein, UA: 100
pH, UA: 5.5

## 2013-02-08 NOTE — Progress Notes (Signed)
Subjective:    Patient ID: Pamela Collier, female    DOB: 03-Jan-1941, 72 y.o.   MRN: [id number]  HPI  Patient is a pleasant 72 year old female who was seen in clinic approximately one week ago with  urinary tract infection symptoms. She was also complaining of having "some bubbles" in her urine. Patient is being treated with Cipro. Urine culture came back with a suspected contamination and recommendation to repeat if necessary. She is here today for followup and repeat urinalysis.  Current Outpatient Prescriptions on File Prior to Visit  Medication Sig Dispense Refill  . acetaminophen (TYLENOL) 325 MG tablet Take 650 mg by mouth every 4 (four) hours as needed.      . ALPRAZolam (XANAX) 0.5 MG tablet Take 1 tablet (0.5 mg total) by mouth daily as needed for sleep or anxiety.  30 tablet  3  . aspirin 81 MG EC tablet Take 1 tablet (81 mg total) by mouth daily. Swallow whole.  30 tablet  12  . B-D ULTRAFINE III SHORT PEN 31G X 8 MM MISC       . ciprofloxacin (CIPRO) 500 MG tablet Take 1 tablet (500 mg total) by mouth 2 (two) times daily.  14 tablet  0  . eszopiclone (LUNESTA) 2 MG TABS Take 1 tablet (2 mg total) by mouth at bedtime. Take immediately before bedtime  7 tablet  0  . fluticasone (FLONASE) 50 MCG/ACT nasal spray Place 2 sprays into the nose daily.  16 g  6  . furosemide (LASIX) 20 MG tablet Take 20 mg by mouth daily.       [name] glucose blood test strip Check blood sugar 3 times a day DX CODE: 250.0  300 each  11  . HYDROcodone-homatropine (HYCODAN) 5-1.5 MG/5ML syrup Take 5 mLs by mouth at bedtime as needed for cough.  240 mL  0  . INS SYRINGE/NEEDLE 1CC/28G 28G X 1/2" 1 ML MISC 1 each by Does not apply route as needed. To test glucose levels daily.      . Ipratropium-Albuterol (COMBIVENT RESPIMAT) 20-100 MCG/ACT AERS respimat Inhale 1 puff into the lungs every 6 (six) hours.  4 g  0  . isosorbide mononitrate (IMDUR) 30 MG 24 hr tablet Take 30 mg by mouth daily.      . metFORMIN  (GLUCOPHAGE) 1000 MG tablet Take 1 tablet (1,000 mg total) by mouth 2 (two) times daily with a meal.  180 tablet  3  . metoprolol (LOPRESSOR) 50 MG tablet Take 25 mg by mouth 2 (two) times daily.       [name] olmesartan (BENICAR) 40 MG tablet One half daily  90 tablet  3  . Phenylephrine-DM-GG-APAP 5-10-200-325 MG TABS Per bottle directions as needed      . potassium chloride (K-DUR) 10 MEQ tablet Take 1 tablet (10 mEq total) by mouth daily.  30 tablet  5  . pregabalin (LYRICA) 100 MG capsule Take 1 capsule (100 mg total) by mouth 3 (three) times daily.  270 capsule  3  . rosuvastatin (CRESTOR) 10 MG tablet Take 1 tablet (10 mg total) by mouth daily.  90 tablet  3   Current Facility-Administered Medications on File Prior to Visit  Medication Dose Route Frequency Provider Last Rate Last Dose  . albuterol (PROVENTIL) (5 MG/ML) 0.5% nebulizer solution 2.5 mg  2.5 mg Nebulization Once [name], MD         Review of Systems  Constitutional: Negative for fever and chills.  Genitourinary: Negative  for dysuria, frequency, hematuria, flank pain, difficulty urinating and pelvic pain.       "bubbles" in urine       Objective:   Physical Exam  Constitutional: She is oriented to person, place, and time. No distress.  Pulmonary/Chest: Effort normal. No respiratory distress.  Genitourinary:  No suprapubic tenderness  Neurological: She is alert and oriented to person, place, and time.  Psychiatric: She has a normal mood and affect. Her behavior is normal. Judgment and thought content normal.          Assessment & Plan:

## 2013-02-08 NOTE — Assessment & Plan Note (Signed)
UA dipstick shows trace leukocytes, no nitrites and trace RBC. Less protein noted on dipstick. Send urine for culture and urinalysis. Continue cipro until completed. RTC if urinary symptoms persists. Foaming of the urine may be 2/2 protein. Pt has elevated microalbumin and hgba1c. She has a follow up appt on 02/12/13 to discuss with Dr. Derrel Nip.

## 2013-02-09 LAB — URINALYSIS, ROUTINE W REFLEX MICROSCOPIC
Bilirubin Urine: NEGATIVE
Ketones, ur: NEGATIVE
Leukocytes, UA: NEGATIVE
Specific Gravity, Urine: 1.025 (ref 1.000–1.030)
Total Protein, Urine: 100
Urine Glucose: NEGATIVE
pH: 5.5 (ref 5.0–8.0)

## 2013-02-10 LAB — URINE CULTURE: Colony Count: 15000

## 2013-02-14 DIAGNOSIS — Z79899 Other long term (current) drug therapy: Secondary | ICD-10-CM | POA: Diagnosis not present

## 2013-02-15 ENCOUNTER — Encounter: Payer: Self-pay | Admitting: *Deleted

## 2013-02-18 ENCOUNTER — Ambulatory Visit: Payer: Self-pay | Admitting: Internal Medicine

## 2013-02-18 ENCOUNTER — Encounter: Payer: Self-pay | Admitting: Internal Medicine

## 2013-02-18 ENCOUNTER — Ambulatory Visit (INDEPENDENT_AMBULATORY_CARE_PROVIDER_SITE_OTHER): Payer: Medicare Other | Admitting: Internal Medicine

## 2013-02-18 ENCOUNTER — Encounter (INDEPENDENT_AMBULATORY_CARE_PROVIDER_SITE_OTHER): Payer: Self-pay

## 2013-02-18 VITALS — BP 140/60 | HR 68 | Temp 98.4°F | Resp 14 | Wt 171.5 lb

## 2013-02-18 DIAGNOSIS — R4702 Dysphasia: Secondary | ICD-10-CM

## 2013-02-18 DIAGNOSIS — Z86010 Personal history of colonic polyps: Secondary | ICD-10-CM

## 2013-02-18 DIAGNOSIS — N390 Urinary tract infection, site not specified: Secondary | ICD-10-CM

## 2013-02-18 DIAGNOSIS — I798 Other disorders of arteries, arterioles and capillaries in diseases classified elsewhere: Secondary | ICD-10-CM

## 2013-02-18 DIAGNOSIS — Z1231 Encounter for screening mammogram for malignant neoplasm of breast: Secondary | ICD-10-CM | POA: Diagnosis not present

## 2013-02-18 DIAGNOSIS — G4733 Obstructive sleep apnea (adult) (pediatric): Secondary | ICD-10-CM | POA: Diagnosis not present

## 2013-02-18 DIAGNOSIS — R4789 Other speech disturbances: Secondary | ICD-10-CM

## 2013-02-18 DIAGNOSIS — E1351 Other specified diabetes mellitus with diabetic peripheral angiopathy without gangrene: Secondary | ICD-10-CM

## 2013-02-18 DIAGNOSIS — E1165 Type 2 diabetes mellitus with hyperglycemia: Secondary | ICD-10-CM

## 2013-02-18 LAB — POCT URINALYSIS DIPSTICK
Glucose, UA: NEGATIVE
Protein, UA: >=300
Urobilinogen, UA: 0.2

## 2013-02-18 LAB — HM MAMMOGRAPHY

## 2013-02-18 MED ORDER — OLMESARTAN MEDOXOMIL 20 MG PO TABS: 20.0000 mg | ORAL_TABLET | Freq: Every day | ORAL | Status: DC

## 2013-02-18 NOTE — Progress Notes (Signed)
Patient ID: Pamela Collier, female   DOB: 1940-07-11, 72 y.o.   MRN: [id number]  Patient Active Problem List   Diagnosis Date Noted  . UTI (urinary tract infection) 02/03/2013  . Need for prophylactic vaccination and inoculation against influenza 02/03/2013  . OSA (obstructive sleep apnea) 12/07/2012  . 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 Dysphasia 12/07/2012  . Pleural effusion 10/25/2012  . Cough 10/25/2012  . Hospital discharge follow-up 09/17/2012  . SOB (shortness of breath) 05/27/2012  . Pulmonary nodule seen on imaging study 05/20/2012  . Heart failure, diastolic, acute [id number]  . 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 Cardiomyopathy, ischemic   . Hepatic steatosis   . Osteoporosis, post-menopausal   . 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 Atherosclerotic peripheral vascular disease with gangrene   . DM (diabetes mellitus), type 2 with renal complications [id number]  . Double vessel coronary artery disease 12/14/2011    Subjective:  CC:   Chief Complaint  Patient presents with  . Follow-up    Polyuria, odor and lower abdominal pain  . Diabetes  . Headache  . Urinary Tract Infection    HPI:   Pamela Collier a 72 y.o. female who presents for Follow up on uncontrolled  DM, OSA, and GERD . She has been evaluated by  Dr. Fredderick Phenix for persistent esophagitis  who plans to do an esophageal 24 pH monitor   OSA:  Having trouble tolerating the mask ,  Doesn't feel it is creating a tight facial seal as in the past brecause she has to take it off twice or more per night to go to the bathroom and drink a little water.   Suprapubic pain for the last several days.  Notes that her Urine is bubbly,  For the last two days  No history of sexual intercourse.  Symptoms are aggravated by lying down in bed.  Was treated empirically for UTI 3 weeks ago but culture was contaminated and inconclusive.    Altered bowel habits.  Having diarrhea and constipation . Has loose stools at least 3 days per week which she attributes to the  Increased metformin dose . Diarrhea is followed by constipation  No nausea, doesn't have much of an appetite .  Drinks Glucerna once a day.  Has reduced her potatoes and bread and has lost a concsierable amount of wright   DM:  Taking 28 units of insulin in the AM and 30 units before dinner,.  Post prandial sugars are  All < 160, pre dinner sugars  < 140 66% of the time    Past Medical History  Diagnosis Date  . Diabetes mellitus   . 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 Cardiomyopathy, ischemic Jan 2013    EF 35 to 40% [facility])  . Hepatic steatosis     by CT abd pelvis  . Osteoporosis, post-menopausal   . 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 Abdominal hysterectomy      at ge 39. secondary to bleeding  . Toe amputation  Sept 2012    Right 2nd toe, [place]  . Ptca  August 2012    Right Posterior tibial artery ,  D[place]       The following portions of the patient's history were reviewed and updated as appropriate: Allergies, current medications, and problem list.    Review of Systems:   12 Pt  review of systems was negative except those addressed in the HPI,     History   Social History  . Marital Status: Married    Spouse Name: N/A    Number of Children: N/A  . Years of Education: N/A   Occupational History  . Not on file.   Social History Main Topics  . Smoking status: Never Smoker   . Smokeless tobacco: Never Used  . Alcohol Use: No  . Drug Use: No  . Sexual Activity: Yes   Other Topics Concern  . Not on file   Social History Narrative  . No narrative on file     Objective:  Filed Vitals:   02/18/13 1437  BP: 140/60  Pulse: 68  Temp: 98.4 F (36.9 C)  Resp: 14     General appearance: alert, cooperative and appears stated age Ears: normal TM's and external ear canals both ears Throat: lips, mucosa, and tongue normal; teeth and gums normal Neck: no adenopathy, no carotid bruit, supple, symmetrical, trachea midline and thyroid not enlarged, symmetric, no tenderness/mass/nodules Back: symmetric, no curvature. ROM normal. No CVA tenderness. Lungs: clear to auscultation bilaterally Heart: regular rate and rhythm, S1, S2 normal, no murmur, click, rub or gallop Abdomen: soft, non-tender; bowel sounds normal; no masses,  no organomegaly Pulses: 2+ and symmetric Skin: Skin color, texture, turgor normal. No rashes or lesions Lymph nodes: Cervical, supraclavicular, and axillary nodes normal.  Assessment and Plan:  DM (diabetes mellitus), type 2 with renal complications She has significan proteinuria on dipstick today. A1c was 8.1 two weeks ago.  A review of pre andpost dinner blood sugars for the last 10 days shows that  70% of the pre dinner sugars < 140  , the rest < 180.  Post prandials are all < 150.  No changes today . She will return in  2 weeks with pre breakfast and pre lunch blood sugars.   She has modified her diet.  Will stop the metformin given her recurrent diarrhea . She is on appropriate medications.   Dysphasia Barium swallow July 2014 showed delayed esophageal motility, distal esophageal stricture  EGD Sept 2014showed  nodular mucosa at GE junction and duodenal mucosal abnormalities as well as middle third of esophagus were biopsied ; path was normal   protonix prescribed; ambulatory esophageal mamometry is schedule due to persistent symptoms   OSA (obstructive sleep apnea) Advised to contact the supplier of her FM for new fitting   UTI (urinary tract infection) UA was abnormal but urine culture showed no infection .  If her  bilateral lower quadrant pain does not resolve, given her rapid wt loss,  Will need consider getting a CT of the abdomen and pelvis for further evaluation    Updated Medication List Outpatient Encounter Prescriptions as of 02/18/2013  Medication Sig Dispense Refill  . acetaminophen (TYLENOL) 325 MG tablet Take 650 mg by mouth every 4 (four) hours as needed.      . ALPRAZolam (XANAX) 0.5 MG tablet Take 1 tablet (0.5 mg total) by mouth daily as needed for sleep or anxiety.  30 tablet  3  . aspirin 81 MG EC tablet Take 1 tablet (81 mg total) by mouth daily. Swallow whole.  30 tablet  12  . B-D ULTRAFINE III SHORT  PEN 31G X 8 MM MISC       . fluticasone (FLONASE) 50 MCG/ACT nasal spray Place 2 sprays into the nose daily.  16 g  6  . furosemide (LASIX) 20 MG tablet Take 20 mg by mouth daily.       [name] glucose blood test strip Check blood sugar 3 times a day DX CODE: 250.0  300 each  11  . INS SYRINGE/NEEDLE 1CC/28G 28G X 1/2" 1 ML MISC 1 each by Does not apply route as needed. To test glucose levels daily.      . insulin aspart protamine- aspart (NOVOLOG MIX 70/30) (70-30) 100 UNIT/ML injection Please fill using the pens.  30 units before dinner,  28 units before breakfast  Needs 18 pens for a 90 day supply   PLEASE ALSO SUPPLY NEEDLES FOR THE FLEXPEN 180/90 DAY      . Ipratropium-Albuterol (COMBIVENT RESPIMAT) 20-100 MCG/ACT AERS respimat Inhale 1 puff into the lungs every 6 (six) hours.  4 g  0  . isosorbide mononitrate (IMDUR) 30 MG 24 hr tablet Take 30 mg by mouth daily.      . metoprolol (LOPRESSOR) 50 MG tablet Take 25 mg by mouth 2 (two) times daily.       [name] olmesartan (BENICAR) 20 MG tablet Take 1 tablet (20 mg total) by mouth daily. One half daily  30 tablet  0  . potassium chloride (K-DUR) 10 MEQ tablet Take 1 tablet (10 mEq total) by mouth daily.  30 tablet  5  . pregabalin (LYRICA) 100 MG capsule Take 1 capsule (100 mg total) by mouth 3 (three) times daily.  270 capsule  3  .  rosuvastatin (CRESTOR) 10 MG tablet Take 1 tablet (10 mg total) by mouth daily.  90 tablet  3  . [DISCONTINUED] metFORMIN (GLUCOPHAGE) 1000 MG tablet Take 1 tablet (1,000 mg total) by mouth 2 (two) times daily with a meal.  180 tablet  3  . [DISCONTINUED] olmesartan (BENICAR) 40 MG tablet One half daily  90 tablet  3  . HYDROcodone-homatropine (HYCODAN) 5-1.5 MG/5ML syrup Take 5 mLs by mouth at bedtime as needed for cough.  240 mL  0  . Phenylephrine-DM-GG-APAP 5-10-200-325 MG TABS Per bottle directions as needed      . [DISCONTINUED] ciprofloxacin (CIPRO) 500 MG tablet Take 1 tablet (500 mg total) by mouth 2 (two) times daily.  14 tablet  0  . [DISCONTINUED] eszopiclone (LUNESTA) 2 MG TABS Take 1 tablet (2 mg total) by mouth at bedtime. Take immediately before bedtime  7 tablet  0   Facility-Administered Encounter Medications as of 02/18/2013  Medication Dose Route Frequency Provider Last Rate Last Dose  . albuterol (PROVENTIL) (5 MG/ML) 0.5% nebulizer solution 2.5 mg  2.5 mg Nebulization Once [name], MD         Orders Placed This Encounter  Procedures  . Urine culture  . HM MAMMOGRAPHY  . POCT urinalysis dipstick    No Follow-up on file.

## 2013-02-18 NOTE — Patient Instructions (Addendum)
Your blood sugars so far are very good.  Continue your current insulin regimen  I want you to start checking your blood sugar before breakfast and before lunch and stop checking it before and after dinner  Stop the metformin ,  To see if the diarrhea resolves  Return in one month   If your urine test is positive I will call you in an antibiotic so check with your pharmacy on Sunday

## 2013-02-20 DIAGNOSIS — Z86010 Personal history of colonic polyps: Secondary | ICD-10-CM | POA: Insufficient documentation

## 2013-02-20 LAB — URINE CULTURE

## 2013-02-20 NOTE — Assessment & Plan Note (Signed)
UA was abnormal but urine culture showed no infection .  If her bilateral lower quadrant pain does not resolve, given her rapid wt loss,  Will need consider getting a CT of the abdomen and pelvis for further evaluation

## 2013-02-20 NOTE — Assessment & Plan Note (Signed)
Advised to contact the supplier of her FM for new fitting

## 2013-02-20 NOTE — Assessment & Plan Note (Addendum)
She has significan proteinuria on dipstick today. A1c was 8.1 two weeks ago.  A review of pre andpost dinner blood sugars for the last 10 days shows that  70% of the pre dinner sugars < 140  , the rest < 180.  Post prandials are all < 150.  No changes today . She will return in  2 weeks with pre breakfast and pre lunch blood sugars.   She has modified her diet.  Will stop the metformin given her recurrent diarrhea . She is on appropriate medications.

## 2013-02-20 NOTE — Assessment & Plan Note (Signed)
S/p angioplasty of right PTA August 2012., followed by right 2nd toe amputation Sept 2012 for gangrene (fowler)

## 2013-02-20 NOTE — Assessment & Plan Note (Signed)
Barium swallow July 2014 showed delayed esophageal motility, distal esophageal stricture  EGD Sept 2014showed  nodular mucosa at GE junction and duodenal mucosal abnormalities as well as middle third of esophagus were biopsied ; path was normal   protonix prescribed; ambulatory esophageal mamometry is schedule due to persistent symptoms

## 2013-02-22 ENCOUNTER — Telehealth: Payer: Self-pay | Admitting: Internal Medicine

## 2013-02-22 ENCOUNTER — Encounter: Payer: Self-pay | Admitting: Pulmonary Disease

## 2013-02-22 ENCOUNTER — Ambulatory Visit (INDEPENDENT_AMBULATORY_CARE_PROVIDER_SITE_OTHER): Payer: Medicare Other | Admitting: Pulmonary Disease

## 2013-02-22 VITALS — BP 160/68 | HR 66 | Temp 98.7°F | Ht 63.0 in | Wt 183.0 lb

## 2013-02-22 DIAGNOSIS — R911 Solitary pulmonary nodule: Secondary | ICD-10-CM

## 2013-02-22 DIAGNOSIS — G4733 Obstructive sleep apnea (adult) (pediatric): Secondary | ICD-10-CM

## 2013-02-22 DIAGNOSIS — J90 Pleural effusion, not elsewhere classified: Secondary | ICD-10-CM | POA: Diagnosis not present

## 2013-02-22 DIAGNOSIS — D860 Sarcoidosis of lung: Secondary | ICD-10-CM

## 2013-02-22 DIAGNOSIS — D869 Sarcoidosis, unspecified: Secondary | ICD-10-CM

## 2013-02-22 DIAGNOSIS — R0602 Shortness of breath: Secondary | ICD-10-CM | POA: Diagnosis not present

## 2013-02-22 DIAGNOSIS — J99 Respiratory disorders in diseases classified elsewhere: Secondary | ICD-10-CM

## 2013-02-22 MED ORDER — PREGABALIN 100 MG PO CAPS: 100.0000 mg | ORAL_CAPSULE | Freq: Three times a day (TID) | ORAL | Status: DC

## 2013-02-22 NOTE — Assessment & Plan Note (Signed)
Clinically she is stable so I think that the sarcoid is dormant. However, considering the recent chylus effusion and mediastinal lymphadenopathy I would like to get some baseline pulmonary function tests on her. I educated her today on some of the adverse effects of sarcoid including uveitis. At this point, I do not think she needs to have prednisone.  Plan: -Baseline full pulmonary function testing

## 2013-02-22 NOTE — Patient Instructions (Signed)
We will arrange a CT scan at [facility] to evaluate your pulmonary nodule We will arrange pulmonary function testing at [facility] to evaluate your sarcoid Ask the CPAP company to provide you with a nose mask to try  We will see you back in 4-6 months or sooner if needed

## 2013-02-22 NOTE — Progress Notes (Signed)
Subjective:    Patient ID: Pamela Collier, female    DOB: 15-May-1940, 72 y.o.   MRN: [id number]  Synopsis: This is a 72 year old female first seen by Dr. Melvyn Novas in the [facility] pulmonary office in January 2014 for pulmonary nodule and some shortness of breath. An ACE inhibitor was held during that visit and followup imaging was planned.  HPI   06/28/12 rov--Since her last visit Pamela Collier says that her shortness of breath has improved. She had cough previously but this has improved since stopping the ACE inhibitor. She continues to have a globus sensation in her throat and notes postnasal drip and sinus congestion. She was recently seen an emergency room for what sounds like a congestive heart telemetry exacerbation in the setting of her known ischemic cardiomyopathy. She is feeling much better now.   10/25/2012 routine office visit>> Pamela Collier states that her cough has improved since the last visit. However she continues to have fatigue and somnolence throughout the daytime. She states that her husband says that she has trouble breathing while she is asleep at night. She has never had a polysomnogram. She has minimal cough now. She does note some shortness of breath on exertion that has not really changed since the last visit. She had a CT scan of her chest a week ago and is here to followup on the results. She has not had recent leg swelling, fever, chills. She states that she has had trouble swallowing for quite some time. She frequently coughs while eating and chokes on food.  12/07/2012 ROV >> Pamela Collier says that her swallowing has really been bothering her lately. She has food getting stuck in her chest often and sometimes feels that it is still there at night when she goes to lay down. She currently has to throw up in the evenings. Sometimes the food is digested other times it is not. Her shortness of breath is actually a little better since having the thoracentesis. She  frequently does not have much energy throughout the day. She wakes up often in the middle the night. Her sleep study showed sleep apnea. Her swelling in her legs has been at baseline. She continues to take her medicines.  02/22/2013 ROV >> Pamela Collier saw a gastroenterologist and the plan is for her to have a 24 hour pH probe.  She says that they want to know how bad her reflux it.  Her breathing has been doing good since the last visit.  She feels like it is improved compared to prior to the last thoracentesis.  She has been having bags form under her eyes after wearing the face mask all night long.  Sh sleeps better since wearing the CPAP machine.  She isn't too sure if the mask is fitting right. She is using a nose and face mask.   Past Medical History  Diagnosis Date  . Diabetes mellitus   . 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 Cardiomyopathy, ischemic Jan 2013    EF 35 to 40% [facility])  . Hepatic steatosis     by CT abd pelvis  . Osteoporosis, post-menopausal   . 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  HENT: Negative for congestion, postnasal  drip and rhinorrhea.   Respiratory: Negative for cough, shortness of breath and wheezing.   Cardiovascular: Negative for chest pain, palpitations and leg swelling.       Objective:   Physical Exam   Filed Vitals:   02/22/13 1529  BP: 160/68  Pulse: 66  Temp: 98.7 F (37.1 C)  TempSrc: Oral  Height: 5\' 3"  (1.6 m)  Weight: 183 lb (83.008 kg)  SpO2: 97%   Gen: well appearing, no acute distress HEENT: NCAT, EOMi, OP clear, PULM: CTA B CV: RRR, no mgr, no JVD AB: BS+, soft, nontender, no hsm Ext: warm, no edema, no clubbing, no cyanosis  January 2014 CXR [facility]  reviewed by me with vascular congestion and cardiomegally, no mass.  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 However, considering the recent chylus effusion and mediastinal lymphadenopathy I would like to get some baseline pulmonary function tests on her. I educated her today on some of the adverse effects of sarcoid including uveitis. At this point, I do not think she needs to have prednisone.  Plan: -Baseline full pulmonary function testing  SOB (shortness of breath) Slowly improving  Pulmonary nodule seen on imaging study I believe that this is related to the sarcoid. However, considering the mediastinal lymphadenopathy (also likely do to sarcoid) I would like to get another CT scan as soon as possible to ensure that it has not grown.   Plan: -Repeat CT chest  Pleural effusion We will follow this up with a repeat CT chest.  OSA (obstructive sleep apnea) Continue CPAP. I have advised that she gets a nasal mask from the DME company.    Updated Medication List Outpatient Encounter Prescriptions as of 02/22/2013  Medication Sig Dispense Refill  . acetaminophen (TYLENOL) 325 MG tablet Take 650 mg by mouth every 4 (four) hours as needed.      . ALPRAZolam (XANAX) 0.5 MG tablet Take 1 tablet (0.5 mg total) by mouth daily as needed for sleep or anxiety.  30 tablet  3  . aspirin 81 MG EC tablet Take 1 tablet (81 mg total) by mouth daily. Swallow whole.  30 tablet  12  . B-D ULTRAFINE III SHORT PEN 31G X 8 MM MISC       . fluticasone (FLONASE) 50 MCG/ACT nasal spray Place 2 sprays into the nose daily.  16 g  6  . furosemide (LASIX) 20 MG tablet Take 20 mg by mouth daily.       [name] glucose blood test strip  Check blood sugar 3 times a day DX CODE: 250.0  300 each  11  . HYDROcodone-homatropine (HYCODAN) 5-1.5 MG/5ML syrup Take 5 mLs by mouth at bedtime as needed for cough.  240 mL  0  . INS SYRINGE/NEEDLE 1CC/28G 28G X 1/2" 1 ML MISC 1 each by Does not apply route as needed. To test glucose levels daily.      . insulin aspart protamine- aspart (NOVOLOG MIX 70/30) (70-30) 100 UNIT/ML injection Please fill using the pens.  30 units before dinner,  28 units before breakfast  Needs 18 pens for a 90 day supply   PLEASE ALSO SUPPLY NEEDLES FOR THE FLEXPEN 180/90 DAY      . Ipratropium-Albuterol (  COMBIVENT RESPIMAT) 20-100 MCG/ACT AERS respimat Inhale 1 puff into the lungs every 6 (six) hours.  4 g  0  . isosorbide mononitrate (IMDUR) 30 MG 24 hr tablet Take 30 mg by mouth daily.      . metoprolol (LOPRESSOR) 50 MG tablet Take 25 mg by mouth 2 (two) times daily.       [name] olmesartan (BENICAR) 20 MG tablet Take 1 tablet (20 mg total) by mouth daily. One half daily  30 tablet  0  . Phenylephrine-DM-GG-APAP 5-10-200-325 MG TABS Per bottle directions as needed      . rosuvastatin (CRESTOR) 10 MG tablet Take 1 tablet (10 mg total) by mouth daily.  90 tablet  3  . [DISCONTINUED] pregabalin (LYRICA) 100 MG capsule Take 1 capsule (100 mg total) by mouth 3 (three) times daily.  270 capsule  3  . [DISCONTINUED] potassium chloride (K-DUR) 10 MEQ tablet Take 1 tablet (10 mEq total) by mouth daily.  30 tablet  5   Facility-Administered Encounter Medications as of 02/22/2013  Medication Dose Route Frequency Provider Last Rate Last Dose  . albuterol (PROVENTIL) (5 MG/ML) 0.5% nebulizer solution 2.5 mg  2.5 mg Nebulization Once [name], MD

## 2013-02-22 NOTE — Assessment & Plan Note (Signed)
We will follow this up with a repeat CT chest.

## 2013-02-22 NOTE — Assessment & Plan Note (Signed)
Slowly improving

## 2013-02-22 NOTE — Assessment & Plan Note (Signed)
Continue CPAP. I have advised that she gets a nasal mask from the DME company.

## 2013-02-22 NOTE — Assessment & Plan Note (Signed)
I believe that this is related to the sarcoid. However, considering the mediastinal lymphadenopathy (also likely do to sarcoid) I would like to get another CT scan as soon as possible to ensure that it has not grown.   Plan: -Repeat CT chest

## 2013-02-22 NOTE — Telephone Encounter (Signed)
pregabalin (LYRICA) 100 MG capsule

## 2013-02-22 NOTE — Telephone Encounter (Signed)
Ok to refill to [facility]?

## 2013-02-24 DIAGNOSIS — B351 Tinea unguium: Secondary | ICD-10-CM | POA: Diagnosis not present

## 2013-02-24 DIAGNOSIS — I739 Peripheral vascular disease, unspecified: Secondary | ICD-10-CM | POA: Diagnosis not present

## 2013-02-24 DIAGNOSIS — Q828 Other specified congenital malformations of skin: Secondary | ICD-10-CM | POA: Diagnosis not present

## 2013-03-01 ENCOUNTER — Telehealth: Payer: Self-pay | Admitting: Internal Medicine

## 2013-03-01 MED ORDER — PREGABALIN 100 MG PO CAPS: 100.0000 mg | ORAL_CAPSULE | Freq: Three times a day (TID) | ORAL | Status: DC

## 2013-03-01 NOTE — Telephone Encounter (Signed)
Placed samples up front notified patient, script sent to pharmacy .

## 2013-03-01 NOTE — Telephone Encounter (Signed)
The patient has not received her prescription for lyrica . She called [facility] and they have not received her prescription. She is completely out of medication and her legs hurt.

## 2013-03-07 ENCOUNTER — Encounter: Payer: Self-pay | Admitting: Internal Medicine

## 2013-03-15 ENCOUNTER — Ambulatory Visit: Payer: Self-pay | Admitting: Pulmonary Disease

## 2013-03-15 DIAGNOSIS — J9819 Other pulmonary collapse: Secondary | ICD-10-CM | POA: Diagnosis not present

## 2013-03-15 DIAGNOSIS — R911 Solitary pulmonary nodule: Secondary | ICD-10-CM | POA: Diagnosis not present

## 2013-03-15 DIAGNOSIS — J90 Pleural effusion, not elsewhere classified: Secondary | ICD-10-CM | POA: Diagnosis not present

## 2013-03-15 LAB — PULMONARY FUNCTION TEST

## 2013-03-17 ENCOUNTER — Encounter: Payer: Self-pay | Admitting: Pulmonary Disease

## 2013-03-17 ENCOUNTER — Telehealth: Payer: Self-pay

## 2013-03-17 NOTE — Telephone Encounter (Signed)
Message copied by Len Blalock on Thu Mar 17, 2013  1:26 PM ------      Message from: Simonne Maffucci B      Created: Thu Mar 17, 2013  1:24 PM       A,            Please let her know that the nodule has not changed on her CT chest.  This is good            We will go over the CT when she comes back next            Thanks      B ------

## 2013-03-17 NOTE — Telephone Encounter (Signed)
Patient made aware of CT results. Pamela Collier

## 2013-03-24 ENCOUNTER — Encounter: Payer: Self-pay | Admitting: Internal Medicine

## 2013-03-24 ENCOUNTER — Ambulatory Visit (INDEPENDENT_AMBULATORY_CARE_PROVIDER_SITE_OTHER): Payer: Medicare Other | Admitting: Internal Medicine

## 2013-03-24 ENCOUNTER — Encounter: Payer: Self-pay | Admitting: Pulmonary Disease

## 2013-03-24 ENCOUNTER — Encounter (INDEPENDENT_AMBULATORY_CARE_PROVIDER_SITE_OTHER): Payer: Self-pay

## 2013-03-24 VITALS — BP 118/58 | HR 98 | Temp 98.1°F | Resp 14 | Wt 180.8 lb

## 2013-03-24 DIAGNOSIS — R1310 Dysphagia, unspecified: Secondary | ICD-10-CM | POA: Diagnosis not present

## 2013-03-24 DIAGNOSIS — E1129 Type 2 diabetes mellitus with other diabetic kidney complication: Secondary | ICD-10-CM | POA: Diagnosis not present

## 2013-03-24 DIAGNOSIS — N058 Unspecified nephritic syndrome with other morphologic changes: Secondary | ICD-10-CM

## 2013-03-24 DIAGNOSIS — G4733 Obstructive sleep apnea (adult) (pediatric): Secondary | ICD-10-CM

## 2013-03-24 DIAGNOSIS — R2681 Unsteadiness on feet: Secondary | ICD-10-CM | POA: Insufficient documentation

## 2013-03-24 DIAGNOSIS — R269 Unspecified abnormalities of gait and mobility: Secondary | ICD-10-CM

## 2013-03-24 NOTE — Progress Notes (Signed)
Patient ID: Pamela Collier, female   DOB: 01/12/1941, 72 y.o.   MRN: [id number]   Patient Active Problem List   Diagnosis Date Noted  . Unsteady gait 03/24/2013  . History of colonic polyps 02/20/2013  . UTI (urinary tract infection) 02/03/2013  . Need for prophylactic vaccination and inoculation against influenza 02/03/2013  . OSA (obstructive sleep apnea) 12/07/2012  . 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 Dysphagia 12/07/2012  . Pleural effusion 10/25/2012  . Cough 10/25/2012  . Hospital discharge follow-up 09/17/2012  . SOB (shortness of breath) 05/27/2012  . Pulmonary nodule seen on imaging study 05/20/2012  . Heart failure, diastolic, acute [id number]  . 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 Cardiomyopathy, ischemic   . Hepatic steatosis   . Osteoporosis, post-menopausal   . 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 Atherosclerotic peripheral vascular disease with gangrene   . DM (diabetes mellitus), type 2 with renal complications [id number]  . Double vessel coronary artery disease 12/14/2011    Subjective:  CC:   Chief Complaint  Patient presents with  . Follow-up    1 month    HPI:   Pamela Collier a 72 y.o. female who presents Follow up on uncontrolled DM and other issues including sleep apnea, diagnosed in July with AHi 7.1  [facility]) and dyshpagia,  S/p EGD with normal biopsies of nodule GE junction.    She has been wearing her CPAP mask but it has been pulling on her lower eye lids.  She requested an alternative the but the tech who cam to her house would not try an alternative mask on her soe she continues to have difficulty tolerating the mask.   She has been having episodes of unsteady gait, caused by her right leg crossing over her left.  She has not had any falls but has stumbled a few times.   Her blood sugars have been recorded. Her morning fasting sugars have been elevated in  the 150 to 180 range.  She is not sure what is an appropriate breakfast and has been having instant flavored oatmeal and using pancake syrup and waggles. .    Past Medical History  Diagnosis Date  . Diabetes mellitus   . 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 Cardiomyopathy, ischemic Jan 2013    EF 35 to 40% [facility])  . Hepatic steatosis     by CT abd pelvis  . Osteoporosis, post-menopausal   . 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 Abdominal hysterectomy      at ge 39. secondary to bleeding  . Toe amputation  Sept 2012    Right 2nd toe, [place]  . Ptca  August 2012    Right Posterior tibial artery , [place]       The following portions of the patient's history were reviewed and updated as appropriate: Allergies, current medications, and problem list.    Review of Systems:   12 Pt  review of systems was negative except those addressed in the HPI,     History   Social History  . Marital Status: Married    Spouse Name: N/A    Number of Children:  N/A  . Years of Education: N/A   Occupational History  . Not on file.   Social History Main Topics  . Smoking status: Never Smoker   . Smokeless tobacco: Never Used  . Alcohol Use: No  . Drug Use: No  . Sexual Activity: Yes   Other Topics Concern  . Not on file   Social History Narrative  . No narrative on file    Objective:  Filed Vitals:   03/24/13 1058  BP: 118/58  Pulse: 98  Temp: 98.1 F (36.7 C)  Resp: 14     General appearance: alert, cooperative and appears stated age Ears: normal TM's and external ear canals both ears Throat: lips, mucosa, and tongue normal; teeth and gums normal Neck: no adenopathy, no carotid bruit, supple, symmetrical,  trachea midline and thyroid not enlarged, symmetric, no tenderness/mass/nodules Back: symmetric, no curvature. ROM normal. No CVA tenderness. Lungs: clear to auscultation bilaterally Heart: regular rate and rhythm, S1, S2 normal, no murmur, click, rub or gallop Abdomen: soft, non-tender; bowel sounds normal; no masses,  no organomegaly Pulses: 2+ and symmetric Skin: Skin color, texture, turgor normal. No rashes or lesions Lymph nodes: Cervical, supraclavicular, and axillary nodes normal.  Assessment and Plan:  DM (diabetes mellitus), type 2 with renal complications  She is up-to-date on eye exams and her foot exam is abnormal with PAD, nephropathy and neuropathy complicating her disease.   Diabetes is not well controlled secondary to dietary noncompliance.  Discussed diet.  increased evening insulin dose to 33 units.  Lab Results  Component Value Date   HGBA1C 8.1* 02/03/2013   Lab Results  Component Value Date   CREATININE 0.9 02/03/2013   Lab Results  Component Value Date   MICROALBUR 104.8 Verified by manual dilution.* 02/03/2013    Dysphagia She is s/p barium swallow July 2014>> delayed esophageal motility, distal esophageal stricture EGD Sept 2014 nodular mucosa at GE junction and duodenal mucosal abnormalities as well as middle third of esophagus were biopsied ; path was normal .  Protonix prescribed; ambulatory esophageal manometry planned for continued symptoms    OSA (obstructive sleep apnea) She needs her mask exchanged, and prior referral have left her very dissatisified with the treatment by the tech for failure to address her issues with current mask.  Have asked her to let us know who the company is   Unsteady gait Recommended PT eval but patient declined.  4 pronged walker prescribed.   A total of 40 minutes was spent with patient more than half of which was spent in counseling, reviewing records from other prviders and coordination of care.   Updated Medication  List Outpatient Encounter Prescriptions as of 03/24/2013  Medication Sig  . acetaminophen (TYLENOL) 325 MG tablet Take 650 mg by mouth every 4 (four) hours as needed.  . ALPRAZolam (XANAX) 0.5 MG tablet Take 1 tablet (0.5 mg total) by mouth daily as needed for sleep or anxiety.  [name] aspirin 81 MG EC tablet Take 1 tablet (81 mg total) by mouth daily. Swallow whole.  . B-D ULTRAFINE III SHORT PEN 31G X 8 MM MISC   . fluticasone (FLONASE) 50 MCG/ACT nasal spray Place 2 sprays into the nose daily.  . furosemide (LASIX) 20 MG tablet Take 20 mg by mouth daily.   [name] glucose blood test strip Check blood sugar 3 times a day DX CODE: 250.0  . INS SYRINGE/NEEDLE 1CC/28G 28G X 1/2" 1 ML MISC 1 each by Does not apply route as needed.  To test glucose levels daily.  . insulin aspart protamine- aspart (NOVOLOG MIX 70/30) (70-30) 100 UNIT/ML injection Please fill using the pens.  30 units before dinner,  28 units before breakfast  Needs 18 pens for a 90 day supply   PLEASE ALSO SUPPLY NEEDLES FOR THE FLEXPEN 180/90 DAY  . Ipratropium-Albuterol (COMBIVENT RESPIMAT) 20-100 MCG/ACT AERS respimat Inhale 1 puff into the lungs every 6 (six) hours.  . isosorbide mononitrate (IMDUR) 30 MG 24 hr tablet Take 30 mg by mouth daily.  . metoprolol (LOPRESSOR) 50 MG tablet Take 25 mg by mouth 2 (two) times daily.   [name] olmesartan (BENICAR) 20 MG tablet Take 1 tablet (20 mg total) by mouth daily. One half daily  . Phenylephrine-DM-GG-APAP 5-10-200-325 MG TABS Per bottle directions as needed  . pregabalin (LYRICA) 100 MG capsule Take 1 capsule (100 mg total) by mouth 3 (three) times daily.  . rosuvastatin (CRESTOR) 10 MG tablet Take 1 tablet (10 mg total) by mouth daily.  . [DISCONTINUED] HYDROcodone-homatropine (HYCODAN) 5-1.5 MG/5ML syrup Take 5 mLs by mouth at bedtime as needed for cough.     Orders Placed This Encounter  Procedures  . DME Other see comment  . DME Other see comment  . Ambulatory referral to Ophthalmology     No Follow-up on file.

## 2013-03-24 NOTE — Progress Notes (Signed)
Pre-visit discussion using our clinic review tool. No additional management support is needed unless otherwise documented below in the visit note.  

## 2013-03-24 NOTE — Patient Instructions (Signed)
Your morning blood sugars are too high.  We need to increase the evening dose of insulin to 33 units.    Continue taking 28 units of insulin in the morning before breakfast  Try substituting Bagel Thins for your bagel to cut out carbohydrates.  Maple syrup and pancake syrup are both full of sugar!!! Please try to avoid them  We will contact your sleep apnea machine company and get them to give you a better fitting mask that does not cover your mouth

## 2013-03-26 ENCOUNTER — Encounter: Payer: Self-pay | Admitting: Internal Medicine

## 2013-03-26 NOTE — Assessment & Plan Note (Signed)
She needs her mask exchanged, and prior referral have left her very dissatisified with the treatment by the tech for failure to address her issues with current mask.  Have asked her to let us know who the company is

## 2013-03-26 NOTE — Assessment & Plan Note (Signed)
Recommended PT eval but patient declined.  4 pronged walker prescribed.

## 2013-03-26 NOTE — Assessment & Plan Note (Addendum)
She is up-to-date on eye exams and her foot exam is abnormal with PAD, nephropathy and neuropathy complicating her disease.   Diabetes is not well controlled secondary to dietary noncompliance.  Discussed diet.  increased evening insulin dose to 33 units.  Lab Results  Component Value Date   HGBA1C 8.1* 02/03/2013   Lab Results  Component Value Date   CREATININE 0.9 02/03/2013   Lab Results  Component Value Date   MICROALBUR 104.8 Verified by manual dilution.* 02/03/2013

## 2013-03-26 NOTE — Assessment & Plan Note (Signed)
She is s/p barium swallow July 2014>> delayed esophageal motility, distal esophageal stricture EGD Sept 2014 nodular mucosa at GE junction and duodenal mucosal abnormalities as well as middle third of esophagus were biopsied ; path was normal .  Protonix prescribed; ambulatory esophageal manometry planned for continued symptoms

## 2013-03-29 ENCOUNTER — Telehealth: Payer: Self-pay | Admitting: *Deleted

## 2013-03-29 NOTE — Telephone Encounter (Signed)
Called patient to verify Cpap Mask and which company patient is using left message for patient to return call to office. Have order ready for new mask.

## 2013-03-30 ENCOUNTER — Encounter: Payer: Self-pay | Admitting: Pulmonary Disease

## 2013-03-30 ENCOUNTER — Ambulatory Visit: Payer: Self-pay | Admitting: Gastroenterology

## 2013-03-30 DIAGNOSIS — E7800 Pure hypercholesterolemia, unspecified: Secondary | ICD-10-CM | POA: Diagnosis not present

## 2013-03-30 DIAGNOSIS — I10 Essential (primary) hypertension: Secondary | ICD-10-CM | POA: Diagnosis not present

## 2013-03-30 DIAGNOSIS — Z794 Long term (current) use of insulin: Secondary | ICD-10-CM | POA: Diagnosis not present

## 2013-03-30 DIAGNOSIS — Z7982 Long term (current) use of aspirin: Secondary | ICD-10-CM | POA: Diagnosis not present

## 2013-03-30 DIAGNOSIS — K219 Gastro-esophageal reflux disease without esophagitis: Secondary | ICD-10-CM | POA: Diagnosis not present

## 2013-03-30 DIAGNOSIS — Z9861 Coronary angioplasty status: Secondary | ICD-10-CM | POA: Diagnosis not present

## 2013-03-30 DIAGNOSIS — K2289 Other specified disease of esophagus: Secondary | ICD-10-CM | POA: Diagnosis not present

## 2013-03-30 DIAGNOSIS — K228 Other specified diseases of esophagus: Secondary | ICD-10-CM | POA: Diagnosis not present

## 2013-03-30 DIAGNOSIS — R1310 Dysphagia, unspecified: Secondary | ICD-10-CM | POA: Diagnosis not present

## 2013-03-30 DIAGNOSIS — Z882 Allergy status to sulfonamides status: Secondary | ICD-10-CM | POA: Diagnosis not present

## 2013-03-30 DIAGNOSIS — E119 Type 2 diabetes mellitus without complications: Secondary | ICD-10-CM | POA: Diagnosis not present

## 2013-03-30 DIAGNOSIS — M810 Age-related osteoporosis without current pathological fracture: Secondary | ICD-10-CM | POA: Diagnosis not present

## 2013-03-30 DIAGNOSIS — I2510 Atherosclerotic heart disease of native coronary artery without angina pectoris: Secondary | ICD-10-CM | POA: Diagnosis not present

## 2013-03-30 DIAGNOSIS — K229 Disease of esophagus, unspecified: Secondary | ICD-10-CM | POA: Diagnosis not present

## 2013-03-31 NOTE — Telephone Encounter (Signed)
Tried to reach patient to send new order left message for patient to call office.

## 2013-03-31 NOTE — Telephone Encounter (Signed)
The patient uses [facility]   [phone]

## 2013-04-03 ENCOUNTER — Telehealth: Payer: Self-pay | Admitting: Internal Medicine

## 2013-04-06 ENCOUNTER — Telehealth: Payer: Self-pay | Admitting: Emergency Medicine

## 2013-04-06 NOTE — Telephone Encounter (Signed)
Erin from [employer] called and lvm stating she needed clarification on a order that was faxed over on this patient. Please call her back at [phone] ext [phone].

## 2013-04-08 ENCOUNTER — Telehealth: Payer: Self-pay | Admitting: Internal Medicine

## 2013-04-08 NOTE — Telephone Encounter (Signed)
Pt is going out of town for [employer] and is needing more insulin called into [facility]. Pt is also needing refill on Crestor

## 2013-04-11 MED ORDER — INSULIN ASPART PROT & ASPART (70-30 MIX) 100 UNIT/ML PEN: PEN_INJECTOR | SUBCUTANEOUS | Status: DC

## 2013-04-11 MED ORDER — ROSUVASTATIN CALCIUM 10 MG PO TABS: 10.0000 mg | ORAL_TABLET | Freq: Every day | ORAL | Status: DC

## 2013-04-11 NOTE — Telephone Encounter (Signed)
Rx sent to pharmacy by escript. Pt notified and confirmed that she uses the Novolog 70/30 pens.

## 2013-04-12 NOTE — Telephone Encounter (Signed)
Left message to return call 

## 2013-04-14 ENCOUNTER — Encounter: Payer: Self-pay | Admitting: Pulmonary Disease

## 2013-04-18 ENCOUNTER — Other Ambulatory Visit: Payer: Self-pay | Admitting: *Deleted

## 2013-04-18 MED ORDER — METOPROLOL TARTRATE 50 MG PO TABS: 25.0000 mg | ORAL_TABLET | Freq: Two times a day (BID) | ORAL | Status: DC

## 2013-04-27 ENCOUNTER — Telehealth: Payer: Self-pay | Admitting: Internal Medicine

## 2013-04-27 NOTE — Telephone Encounter (Signed)
Left message samples available

## 2013-04-27 NOTE — Telephone Encounter (Signed)
Pt called wanting to see if you have any samples of benicar  If no samples please call a 2 week supply sent to [facility] [address] Please advise pt  Pt is going out of town Tuesday 12/23

## 2013-04-30 IMAGING — XA IR VASCULAR PROCEDURE
14 of 18 series · 15 of 24 positions shown · IV contrast (IODINE)
Comparison: none

[Series 1: care aorta · 1 of 2 slices shown (1 of 3)]
[im 1/2]
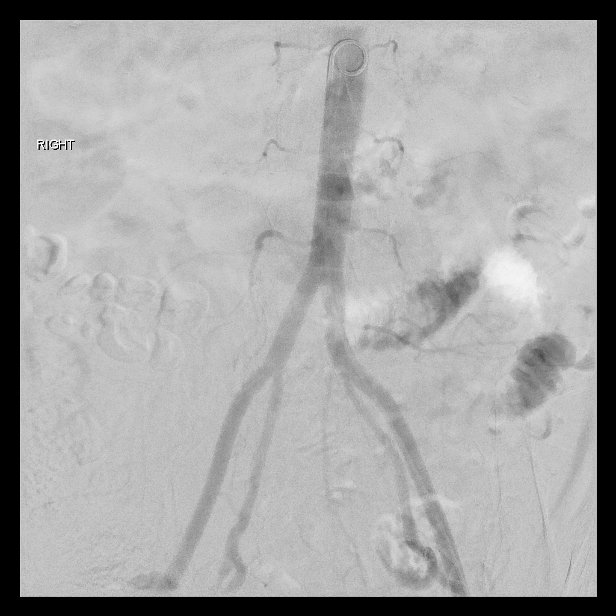

[Series 2: care aorta · 1 of 2 slices shown (2 of 3)]
[im 1/2]
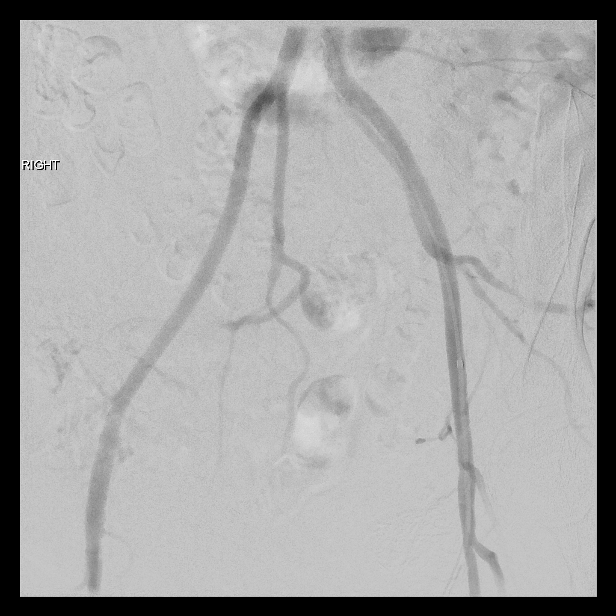

[Series 3: care aorta · 2 of 2 slices shown (3 of 3)]
[im 1/2]
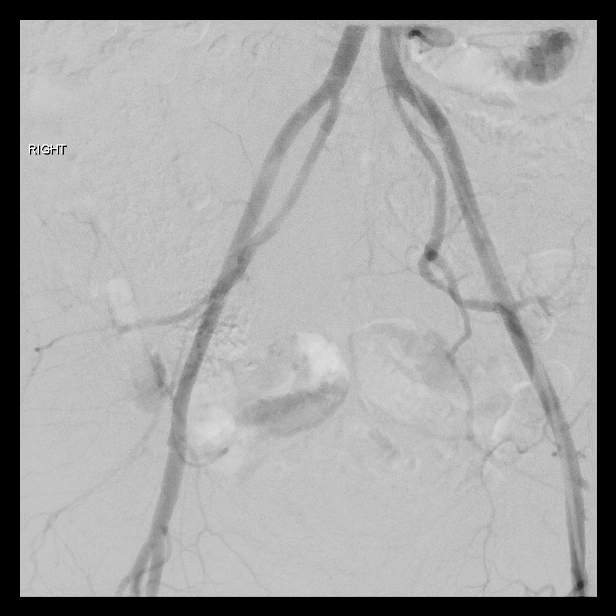
[im 2/2]
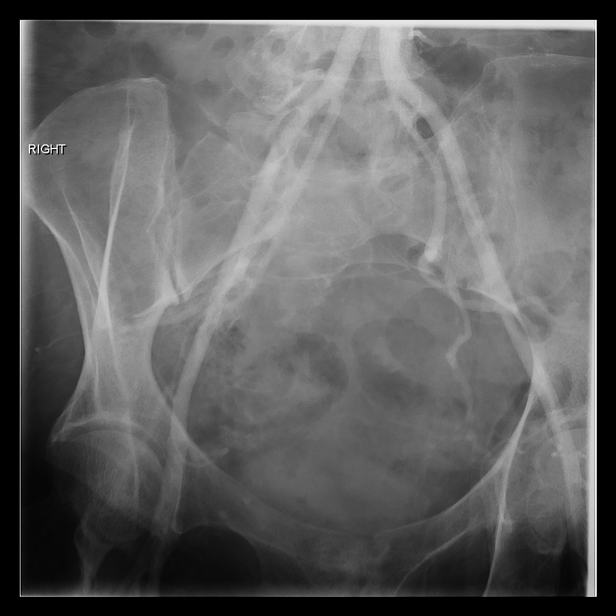

[Series 4: care sfa · 1 of 2 slices shown (1 of 5)]
[im 2/2]
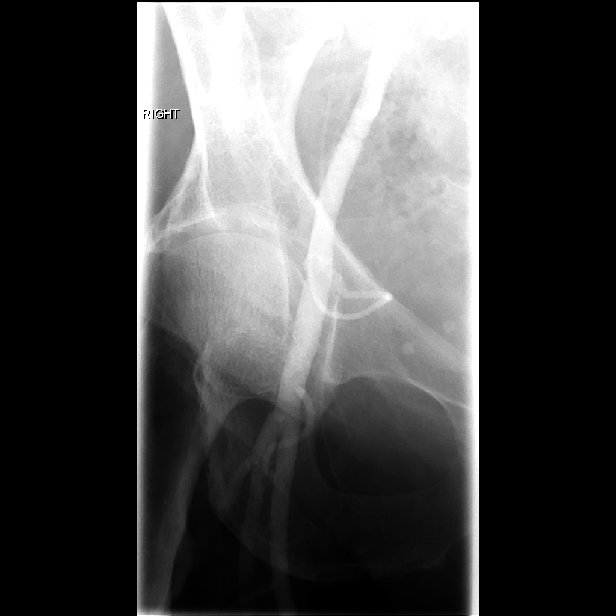

[Series 5: care sfa · 1 of 2 slices shown (2 of 5)]
[im 1/2]
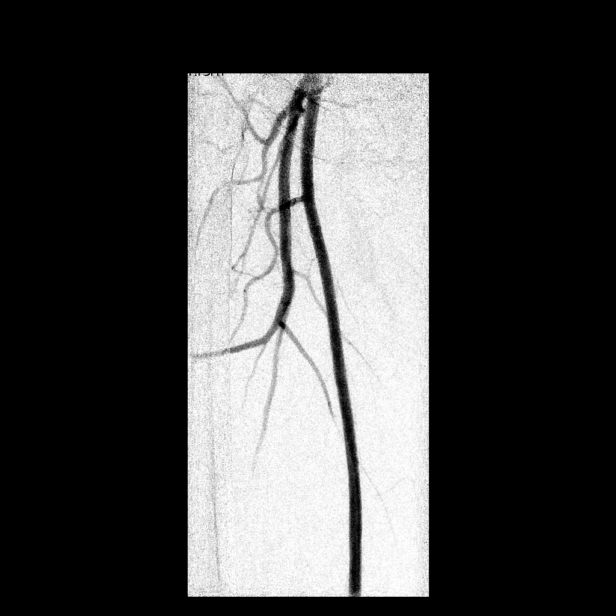

[Series 6: care sfa · 1 of 2 slices shown (3 of 5)]
[im 1/2]
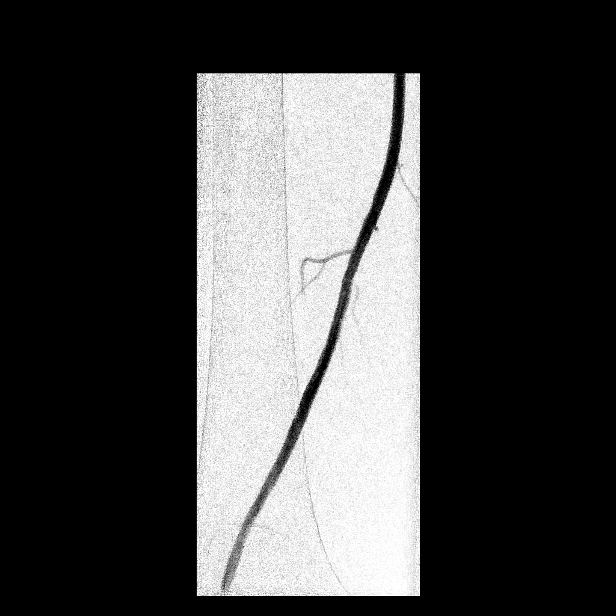

[Series 7: care sfa · 1 of 2 slices shown (4 of 5)]
[im 1/2]
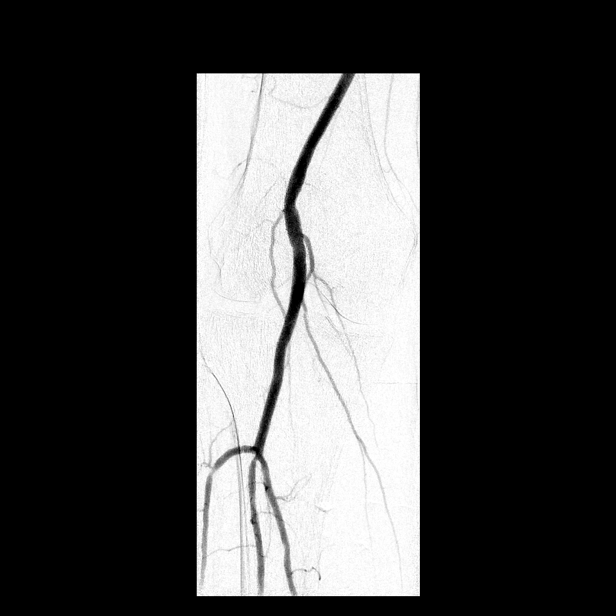

[Series 8: care sfa · 1 of 2 slices shown (5 of 5)]
[im 1/2]
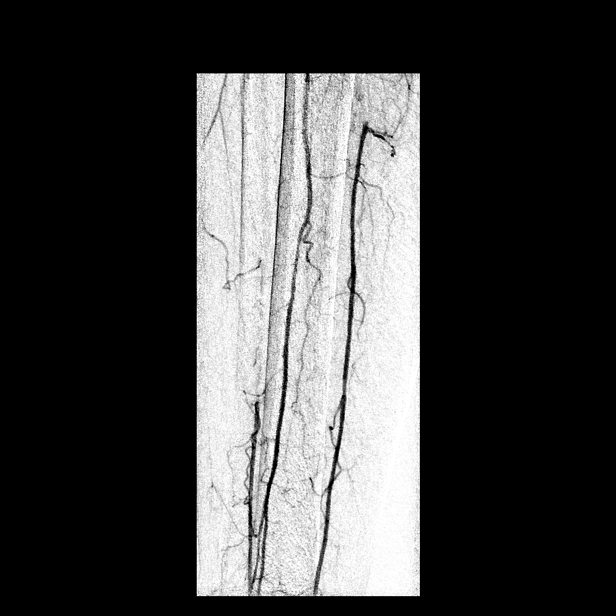

[Series 10: care pop/tibials · 1 of 2 slices shown (1 of 5)]
[im 1/2]
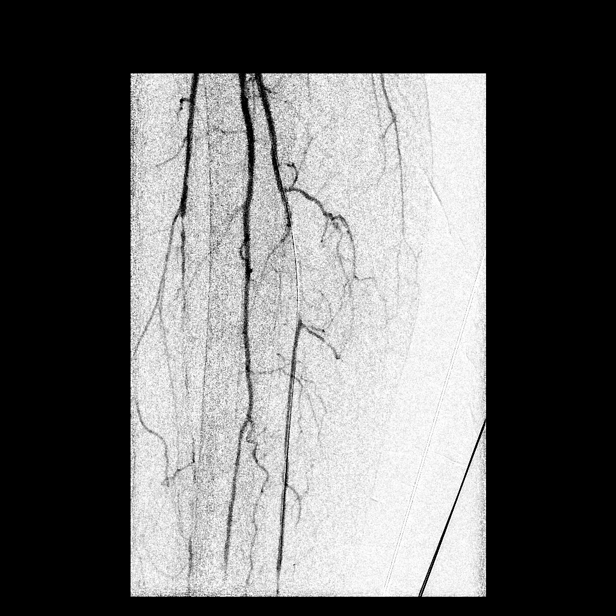

[Series 11: fl - angio · 1 of 1 slices shown]
[im 1/1]
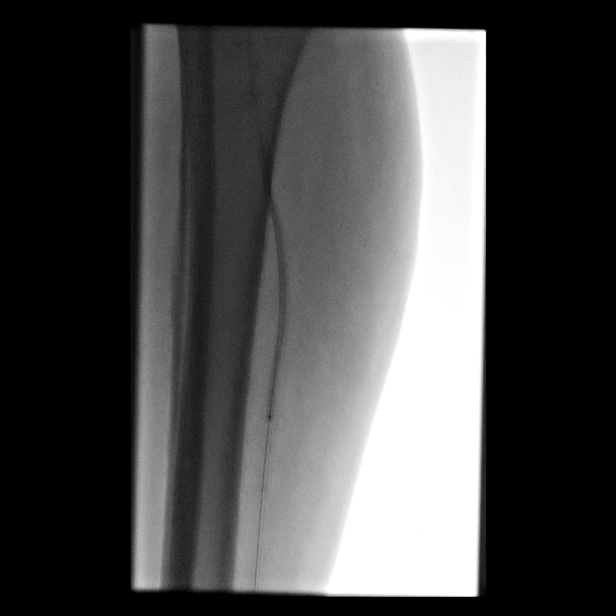

[Series 13: care pop/tibials · 1 of 2 slices shown (2 of 5)]
[im 1/2]
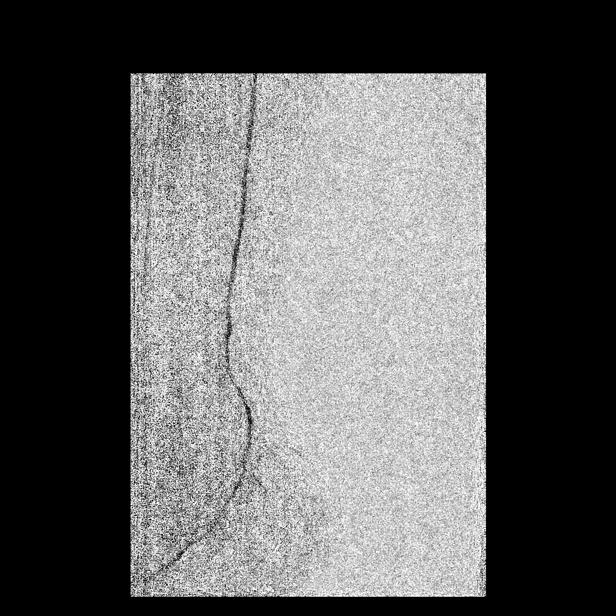

[Series 15: care pop/tibials · 1 of 2 slices shown (3 of 5)]
[im 1/2]
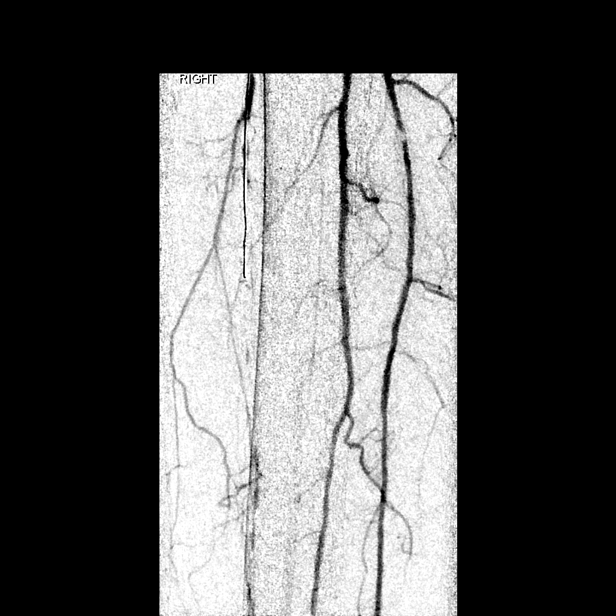

[Series 16: care pop/tibials · 1 of 2 slices shown (4 of 5)]
[im 1/2]
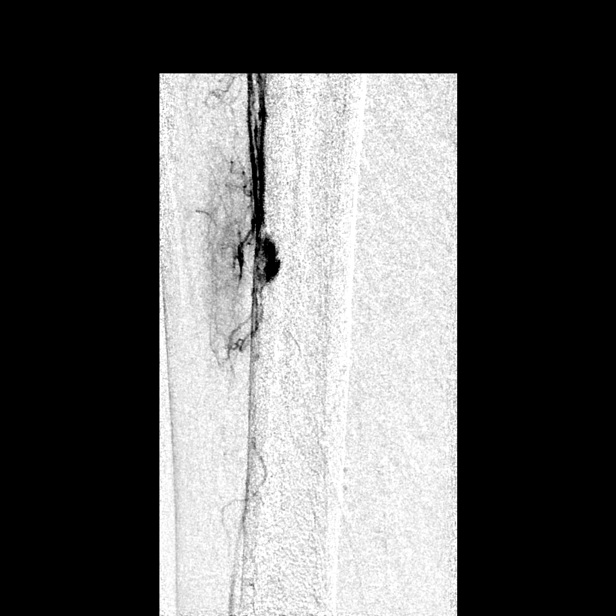

[Series 18: care pop/tibials · 1 of 2 slices shown (5 of 5)]
[im 1/2]
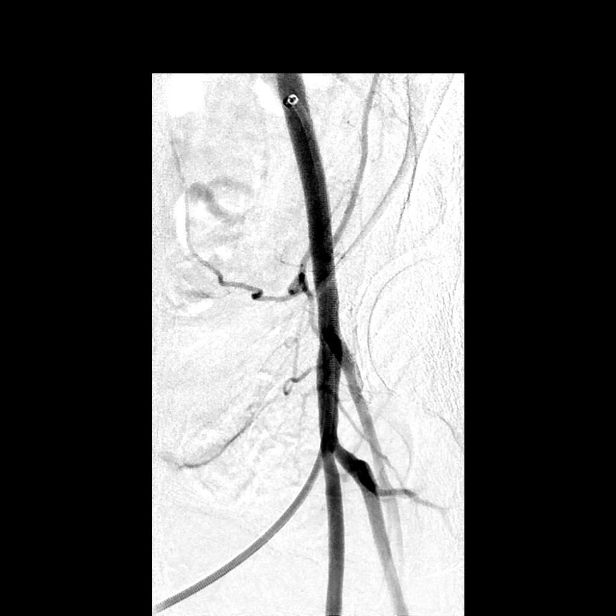

[15 of 24 positions shown; findings below may reference images not displayed]

IMAGES IMPORTED FROM THE SYNGO WORKFLOW SYSTEM
NO DICTATION FOR STUDY

## 2013-05-17 ENCOUNTER — Encounter: Payer: Self-pay | Admitting: Family

## 2013-05-17 ENCOUNTER — Ambulatory Visit (INDEPENDENT_AMBULATORY_CARE_PROVIDER_SITE_OTHER): Payer: Medicare Other | Admitting: Family

## 2013-05-17 ENCOUNTER — Telehealth: Payer: Self-pay | Admitting: Internal Medicine

## 2013-05-17 VITALS — BP 118/56 | HR 65 | Temp 98.2°F | Wt 188.0 lb

## 2013-05-17 DIAGNOSIS — R635 Abnormal weight gain: Secondary | ICD-10-CM | POA: Diagnosis not present

## 2013-05-17 DIAGNOSIS — R0602 Shortness of breath: Secondary | ICD-10-CM | POA: Diagnosis not present

## 2013-05-17 DIAGNOSIS — I509 Heart failure, unspecified: Secondary | ICD-10-CM | POA: Diagnosis not present

## 2013-05-17 DIAGNOSIS — D860 Sarcoidosis of lung: Secondary | ICD-10-CM

## 2013-05-17 DIAGNOSIS — D869 Sarcoidosis, unspecified: Secondary | ICD-10-CM

## 2013-05-17 DIAGNOSIS — J99 Respiratory disorders in diseases classified elsewhere: Secondary | ICD-10-CM

## 2013-05-17 MED ORDER — ALBUTEROL SULFATE (2.5 MG/3ML) 0.083% IN NEBU
2.5000 mg | INHALATION_SOLUTION | Freq: Once | RESPIRATORY_TRACT | Status: AC
  Administered 2013-05-17: 2.5 mg via RESPIRATORY_TRACT

## 2013-05-17 MED ORDER — PREDNISONE 20 MG PO TABS: 20.0000 mg | ORAL_TABLET | Freq: Two times a day (BID) | ORAL | Status: DC

## 2013-05-17 NOTE — Telephone Encounter (Signed)
Pt also needed to get a refill on her bencar  She would like 20mg  instead of the 40.  She is having a hard time cutting pills in half Needs new rx sent to  [facility]

## 2013-05-17 NOTE — Progress Notes (Signed)
Pre visit review using our clinic review tool, if applicable. No additional management support is needed unless otherwise documented below in the visit note. 

## 2013-05-17 NOTE — Telephone Encounter (Signed)
Patient Information:  Caller Name: Nitisha  Phone: [phone]  Patient: Pamela Collier, Pamela Collier  Gender: Female  DOB: 1941/03/15  Age: 73 Years  PCP: Deborra Medina (Adults only)  Office Follow Up:  Does the office need to follow up with this patient?: No  Instructions For The Office: N/A   Symptoms  Reason For Call & Symptoms: Cough, difficulty breathing onset 06:00.  Occasional cough noted.  Headache.  Cough onset 05/10/13.  Shortness of breath increases with activity and lying down.  Emergent symptoms ruled out.  See Today in Office per Asthma Attack protocol due to Coughing continuously that keeps from working or sleeping, and not improved after inhaler or nebulizer.  Appointment with Cher [facility]ai at [facility].  Reviewed Health History In EMR: Yes   Reviewed Medications In EMR: Yes  Reviewed Allergies In EMR: Yes  Reviewed Surgeries / Procedures: Yes  Date of Onset of Symptoms: 05/17/2013  Treatments Tried: Drank warm tea, used inhaler.  Has used Rx cough med without relief.  Treatments Tried Worked: No  Guideline(s) Used:  Breathing Difficulty  Asthma Attack  Disposition Per Guideline:   See Today in Office  Reason For Disposition Reached:   Coughing continuously (nonstop) that keeps from working or sleeping, and not improved after inhaler or nebulizer  Advice Given:  Quick-Relief Asthma Medicine:   Start your quick-relief medicine (e.g., albuterol, salbutamol) at the first sign of any coughing or shortness of breath (don't wait for wheezing). Use your inhaler (2 puffs each time) or nebulizer every 4 hours. Continue the quick-relief medicine until you have not wheezed or coughed for 48 hours.  The best "cough medicine" for an adult with asthma is always the asthma medicine (Note: Don't use cough suppressants, but cough drops may help a tickly cough).  Drinking Liquids:  Try to drink normal amount of liquids (e.g., water). Being adequately hydrated makes it easier to cough up  the sticky lung mucus.  Humidifier:   If the air is dry, use a cool mist humidifier to prevent drying of the upper airway.  Hay Fever  : If you have nasal symptoms from hay fever, it's OK to take antihistamines (Reasons: poor control of allergic rhinitis makes asthma worse whereas antihistamines don't make asthma worse).  Call Back If:  You become worse.  Patient Will Follow Care Advice:  YES  Appointment Scheduled:  05/17/2013 14:45:00 Appointment Scheduled Provider:  Other

## 2013-05-17 NOTE — Telephone Encounter (Signed)
FYI patient being seen in [place] location.

## 2013-05-17 NOTE — Telephone Encounter (Signed)
Patient came in today to make appointment with dr Derrel Nip.  Made appointment with raquel 05/20/13 @ 2:15.  Pt wanted dr Derrel Nip to be aware of what dr Timoteo Gaul did today when ms Buccheri saw her

## 2013-05-17 NOTE — Progress Notes (Signed)
Subjective:    Patient ID: Pamela Collier, female    DOB: [birth year], 73 y.o.   MRN: [id number]  HPI 73 year old African American female, nonsmoker with a history of pulmonary sarcoidosis, type 2 diabetes, depression, and congestive heart failure, patient of Dr. Derrel Nip, is in today with c/o SOB and weight gain x 1 week and worsening. She has been taken Hycodan syrup has not helped. She has a cough productive of white phlegm. Reports wheezing particularly at night. She has a history of type 2 diabetes and is currently taking NovoLog 70/30 twice a day. She denies any chest pain or palpitations.   Review of Systems  Constitutional: Positive for unexpected weight change. Negative for fever, chills and diaphoresis.  HENT: Negative.   Respiratory: Positive for cough, shortness of breath and wheezing.   Cardiovascular: Negative.  Negative for chest pain, palpitations and leg swelling.  Gastrointestinal: Negative.   Endocrine: Negative.   Genitourinary: Negative.   Musculoskeletal: Negative.   Skin: Negative.   Neurological: Negative.   Hematological: Negative.   Psychiatric/Behavioral: Negative.    Past Medical History  Diagnosis Date  . Diabetes mellitus   . 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 Cardiomyopathy, ischemic Jan 2013    EF 35 to 40% [facility])  . Hepatic steatosis     by CT abd pelvis  . Osteoporosis, post-menopausal   . 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 Atherosclerotic peripheral vascular disease with gangrene august 2012    History   Social History  . Marital Status: Married    Spouse Name: N/A    Number of Children: N/A  . Years of Education: N/A   Occupational History  . Not on file.   Social History Main Topics  . Smoking status: Never Smoker   . Smokeless tobacco: Never Used  . Alcohol Use: No  . Drug Use: No  . Sexual  Activity: Yes   Other Topics Concern  . Not on file   Social History Narrative  . No narrative on file    Past Surgical History  Procedure Laterality Date  . Abdominal hysterectomy      at ge 39. secondary to bleeding  . Toe amputation  Sept 2012    Right 2nd toe, [place]  . Ptca  August 2012    Right Posterior tibial artery , [place]    Family History  Problem Relation Age of Onset  . Heart disease Father   . Heart disease Sister   . Heart disease Brother   . Asthma Grandchild   . Breast cancer Maternal Aunt     Allergies  Allergen Reactions  . Sulfa Antibiotics Itching    Current Outpatient Prescriptions on File Prior to Visit  Medication Sig Dispense Refill  . acetaminophen (TYLENOL) 325 MG tablet Take 650 mg by mouth every 4 (four) hours as needed.      . ALPRAZolam (XANAX) 0.5 MG tablet Take 1 tablet (0.5 mg total) by mouth daily as needed for sleep or anxiety.  30 tablet  3  . aspirin 81 MG EC tablet Take 1 tablet (81 mg total) by mouth daily. Swallow whole.  30 tablet  12  . B-D ULTRAFINE III SHORT PEN 31G X 8 MM MISC       . fluticasone (FLONASE) 50 MCG/ACT nasal spray Place 2 sprays  into the nose daily.  16 g  6  . furosemide (LASIX) 20 MG tablet Take 20 mg by mouth daily.       [name] glucose blood test strip Check blood sugar 3 times a day DX CODE: 250.0  300 each  11  . INS SYRINGE/NEEDLE 1CC/28G 28G X 1/2" 1 ML MISC 1 each by Does not apply route as needed. To test glucose levels daily.      . Insulin Aspart Prot & Aspart (NOVOLOG MIX 70/30 FLEXPEN) (70-30) 100 UNIT/ML SUPN 33 units before dinner,  28 units before breakfast  21 pen  1  . insulin aspart protamine- aspart (NOVOLOG MIX 70/30) (70-30) 100 UNIT/ML injection Please fill using the pens.  30 units before dinner,  28 units before breakfast  Needs 18 pens for a 90 day supply   PLEASE ALSO SUPPLY NEEDLES FOR THE FLEXPEN 180/90 DAY      . Ipratropium-Albuterol (COMBIVENT RESPIMAT) 20-100 MCG/ACT AERS respimat  Inhale 1 puff into the lungs every 6 (six) hours.  4 g  0  . isosorbide mononitrate (IMDUR) 30 MG 24 hr tablet Take 30 mg by mouth daily.      . metoprolol (LOPRESSOR) 50 MG tablet Take 0.5 tablets (25 mg total) by mouth 2 (two) times daily.  30 tablet  5  . olmesartan (BENICAR) 20 MG tablet Take 1 tablet (20 mg total) by mouth daily. One half daily  30 tablet  0  . Phenylephrine-DM-GG-APAP 5-10-200-325 MG TABS Per bottle directions as needed      . pregabalin (LYRICA) 100 MG capsule Take 1 capsule (100 mg total) by mouth 3 (three) times daily.  270 capsule  3  . rosuvastatin (CRESTOR) 10 MG tablet Take 1 tablet (10 mg total) by mouth daily.  90 tablet  1   Current Facility-Administered Medications on File Prior to Visit  Medication Dose Route Frequency Provider Last Rate Last Dose  . albuterol (PROVENTIL) (5 MG/ML) 0.5% nebulizer solution 2.5 mg  2.5 mg Nebulization Once [name], MD        BP 118/56  Pulse 65  Temp(Src) 98.2 F (36.8 C) (Oral)  Wt 188 lb (85.276 kg)  SpO2 93%chart    Objective:   Physical Exam  Constitutional: She is oriented to person, place, and time. She appears well-developed and well-nourished.  8 pounds  HENT:  Right Ear: External ear normal.  Left Ear: External ear normal.  Mouth/Throat: Oropharynx is clear and moist.  Neck: Normal range of motion. Neck supple.  Cardiovascular: Normal rate, regular rhythm and normal heart sounds.   Pulmonary/Chest: Effort normal. She has wheezes.  Diminished breath sounds and wheezing noted bilaterally.  Abdominal: Soft. Bowel sounds are normal.  Musculoskeletal: Normal range of motion. She exhibits edema.  1+ edema noted bilaterally  Neurological: She is oriented to person, place, and time.  Skin: Skin is warm and dry.  Psychiatric: She has a normal mood and affect.          Assessment & Plan:  Assessment: 1. Shortness of breath 2. Pulmonary sarcoidosis 3. Congestive heart failure 4. Weight  gain  Plan: Her symptoms appear to be related to congestive heart failure as well as a flare pulmonary sarcoidosis. Therefore, increase her Lasix to 20 mg twice a day x3 days. Prednisone 20 mg twice a day x5 days. The primary care provider in 3 days. Increase NovoLog by 3 units twice a day to help prevent hyperglycemia. Currently off as if symptoms worsen  or persist. Recheck schedule, and as needed.

## 2013-05-17 NOTE — Patient Instructions (Signed)
1. Increase Lasix to 20 mg twice a day x3 days. 2. Increase NovoLog to 31 units in the morning and 36 units in the evening x7 days. 3. Prednisone 20 mg twice a day 4. Followup primary care provider in 3 days

## 2013-05-18 MED ORDER — OLMESARTAN MEDOXOMIL 20 MG PO TABS: 20.0000 mg | ORAL_TABLET | Freq: Every day | ORAL | Status: DC

## 2013-05-18 NOTE — Telephone Encounter (Signed)
benicar rx printed,  Please send to mail order 20 mg dose

## 2013-05-18 NOTE — Telephone Encounter (Signed)
Can we change the Benicar to 20 mg she cannot cut them in half currently on 40 mg.

## 2013-05-18 NOTE — Telephone Encounter (Signed)
Notified patient.

## 2013-05-20 ENCOUNTER — Encounter: Payer: Self-pay | Admitting: Adult Health

## 2013-05-20 ENCOUNTER — Ambulatory Visit (INDEPENDENT_AMBULATORY_CARE_PROVIDER_SITE_OTHER): Payer: Medicare Other | Admitting: Adult Health

## 2013-05-20 VITALS — BP 126/58 | HR 65 | Temp 97.8°F | Resp 12 | Wt 183.5 lb

## 2013-05-20 DIAGNOSIS — D869 Sarcoidosis, unspecified: Secondary | ICD-10-CM

## 2013-05-20 DIAGNOSIS — R0602 Shortness of breath: Secondary | ICD-10-CM | POA: Diagnosis not present

## 2013-05-20 DIAGNOSIS — R635 Abnormal weight gain: Secondary | ICD-10-CM

## 2013-05-20 DIAGNOSIS — R7309 Other abnormal glucose: Secondary | ICD-10-CM

## 2013-05-20 DIAGNOSIS — J99 Respiratory disorders in diseases classified elsewhere: Secondary | ICD-10-CM

## 2013-05-20 DIAGNOSIS — I509 Heart failure, unspecified: Secondary | ICD-10-CM

## 2013-05-20 DIAGNOSIS — R739 Hyperglycemia, unspecified: Secondary | ICD-10-CM

## 2013-05-20 DIAGNOSIS — D860 Sarcoidosis of lung: Secondary | ICD-10-CM

## 2013-05-20 NOTE — Progress Notes (Signed)
Pre visit review using our clinic review tool, if applicable. No additional management support is needed unless otherwise documented below in the visit note. 

## 2013-05-20 NOTE — Progress Notes (Signed)
Subjective:    Patient ID: Pamela Collier, female    DOB: 11-12-1940, 73 y.o.   MRN: [id number]  HPI  73 year old African American female, nonsmoker with a history of pulmonary sarcoidosis, type 2 diabetes on NovoLog 70/30 insulin bid, depression, and congestive heart failure, who was seen in clinic 3 days ago with productive cough, wheezing at night. She was noted to have an 8 lb weight gain. Pt was instructed to take Lasix 20 mg bid x 3 days and prednisone 20 mg bid x 5 days. Because of the expected increase in her blood glucose 2/2 prednisone, pt was instructed to increase insulin by 3 units in the AM and PM.  She reports significant improvement. She is not short of breath. She denies wheezing. Reports that prednisone is making a little jittery. She is requesting a nebulizer to use at home 2/2 difficulty using her inhaler. She has weakness in her hands and difficulty pressing the inhaler for medication.   Past Medical History  Diagnosis Date  . Diabetes mellitus   . 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 Cardiomyopathy, ischemic Jan 2013    EF 35 to 40% [facility])  . Hepatic steatosis     by CT abd pelvis  . Osteoporosis, post-menopausal   . 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 Abdominal hysterectomy      at ge 39. secondary to bleeding  . Toe amputation  Sept 2012    Right 2nd toe, [place]  . Ptca  August 2012    Right Posterior tibial artery , [place]     Family History  Problem Relation Age of Onset  . Heart disease Father   . Heart disease Sister   . Heart disease Brother   . Asthma Grandchild   . Breast cancer Maternal Aunt      History   Social History  . Marital Status: Married    Spouse Name: N/A   Number of Children: N/A  . Years of Education: N/A   Occupational History  . Not on file.   Social History Main Topics  . Smoking status: Never Smoker   . Smokeless tobacco: Never Used  . Alcohol Use: No  . Drug Use: No  . Sexual Activity: Yes   Other Topics Concern  . Not on file   Social History Narrative  . No narrative on file     Current Outpatient Prescriptions on File Prior to Visit  Medication Sig Dispense Refill  . ALPRAZolam (XANAX) 0.5 MG tablet Take 1 tablet (0.5 mg total) by mouth daily as needed for sleep or anxiety.  30 tablet  3  . aspirin 81 MG EC tablet Take 1 tablet (81 mg total) by mouth daily. Swallow whole.  30 tablet  12  . B-D ULTRAFINE III SHORT PEN 31G X 8 MM MISC       . fluticasone (FLONASE) 50 MCG/ACT nasal spray Place 2 sprays into the nose daily.  16 g  6  . furosemide (LASIX) 20 MG tablet Take 20 mg by mouth daily.       [name] glucose blood test strip Check blood sugar 3 times a day DX CODE: 250.0  300 each  11  .  INS SYRINGE/NEEDLE 1CC/28G 28G X 1/2" 1 ML MISC 1 each by Does not apply route as needed. To test glucose levels daily.      . Ipratropium-Albuterol (COMBIVENT RESPIMAT) 20-100 MCG/ACT AERS respimat Inhale 1 puff into the lungs every 6 (six) hours.  4 g  0  . isosorbide mononitrate (IMDUR) 30 MG 24 hr tablet Take 30 mg by mouth daily.      . metoprolol (LOPRESSOR) 50 MG tablet Take 0.5 tablets (25 mg total) by mouth 2 (two) times daily.  30 tablet  5  . olmesartan (BENICAR) 20 MG tablet Take 1 tablet (20 mg total) by mouth daily.  90 tablet  2  . Phenylephrine-DM-GG-APAP 5-10-200-325 MG TABS Per bottle directions as needed      . predniSONE (DELTASONE) 20 MG tablet Take 1 tablet (20 mg total) by mouth 2 (two) times daily with a meal.  10 tablet  0  . pregabalin (LYRICA) 100 MG capsule Take 1 capsule (100 mg total) by mouth 3 (three) times daily.  270 capsule  3  . rosuvastatin (CRESTOR) 10 MG tablet Take 1 tablet (10 mg total) by mouth daily.   90 tablet  1   Current Facility-Administered Medications on File Prior to Visit  Medication Dose Route Frequency Provider Last Rate Last Dose  . albuterol (PROVENTIL) (5 MG/ML) 0.5% nebulizer solution 2.5 mg  2.5 mg Nebulization Once [name], MD        Review of Systems  Respiratory: Negative for cough, shortness of breath and wheezing.        Improvement in overall symptoms  Cardiovascular: Negative for chest pain, palpitations and leg swelling.  Gastrointestinal: Negative.        Objective:   Physical Exam  Constitutional: She is oriented to person, place, and time. No distress.  Cardiovascular: Normal rate, regular rhythm and normal heart sounds.  Exam reveals no gallop.   No murmur heard. Pulmonary/Chest: Effort normal and breath sounds normal. No respiratory distress. She has no wheezes. She has no rales.  Abdominal: Soft. Bowel sounds are normal.  Musculoskeletal: She exhibits no edema.  Neurological: She is alert and oriented to person, place, and time.  Skin: Skin is warm and dry.  Psychiatric: She has a normal mood and affect. Her behavior is normal. Judgment and thought content normal.          Assessment & Plan:   Follow up visit:  1. Shortness of breath - improved on lasix 20 mg bid x 3 days. 2. Pulmonary sarcoidosis - improved on Prednisone 20 mg bid x 5 days. Still has 2 more days of tx. Prednisone making her slightly jittery. 3. Congestive heart failure - Lower extremity edema resolved. No shortness of breath. Breath sounds clear. 4. Weight gain - 5 lb weight loss since last visit.  5. Hyperglycemia - 2/2 prednisone. Pt instructed to increase NovoLog an additional 3 units in the morning and 3 units in the evening. Once prednisone complete and blood sugar stabilizes we will adjust her dose. Pt to continue to monitor blood glucose and call with readings after completing prednisone.

## 2013-05-20 NOTE — Patient Instructions (Addendum)
  You are improving nicely. You have lost 5 lbs since your last visit.  I am ordering a nebulizer machine for your home.  Increase your insulin to 34 units before breakfast and 39 units before dinner.  Continue to monitor your blood sugar at least 3 times daily.  The prednisone is what is raising your blood sugar. Once you have completed the prednisone then your sugars will begin to decrease. We will then be able to decrease your insulin dose. Please call the office with your blood sugar readings after completing the prednisone.

## 2013-05-24 ENCOUNTER — Telehealth: Payer: Self-pay | Admitting: Internal Medicine

## 2013-05-24 ENCOUNTER — Telehealth: Payer: Self-pay | Admitting: *Deleted

## 2013-05-24 NOTE — Telephone Encounter (Signed)
Order for nebulizer faxed to [facility] per pt request.  Patient called with blood sugar. Took last Prednisone on Sunday: 1/9  167 AM, 360 PM 1/10 185 AM, 286 PM 1/11 108 AM, 273 PM 1/12 200 AM, 310 PM 1/13 173 AM

## 2013-05-24 NOTE — Telephone Encounter (Signed)
Please advise diagnosis code.pharmacy stated they do not need diagnosis code called pharmacy patient has picked up medication.

## 2013-05-24 NOTE — Telephone Encounter (Signed)
States they just received fax for script, prednisone, and needs diagnosis code.

## 2013-05-25 NOTE — Telephone Encounter (Signed)
Pt notified and verbalized understanding.

## 2013-05-25 NOTE — Telephone Encounter (Signed)
Blood sugars are still elevated from the effects of the prednisone. Continue to monitor your blood glucose and call with readings on Friday morning.

## 2013-05-27 ENCOUNTER — Telehealth: Payer: Self-pay | Admitting: Adult Health

## 2013-05-27 NOTE — Telephone Encounter (Signed)
The patient was instructed to call back with her blood sugar result Wed 181 am ,at dinner 282 . Thurs 174 am ,at dinner 184 . Fri 172 am

## 2013-05-30 NOTE — Telephone Encounter (Signed)
Blood sugar still elevated. Increase am and pm insulin by 3 units each. Have her make a follow up appt with her PCP - next available.

## 2013-05-31 DIAGNOSIS — R259 Unspecified abnormal involuntary movements: Secondary | ICD-10-CM | POA: Diagnosis not present

## 2013-05-31 DIAGNOSIS — R262 Difficulty in walking, not elsewhere classified: Secondary | ICD-10-CM | POA: Diagnosis not present

## 2013-05-31 DIAGNOSIS — Q828 Other specified congenital malformations of skin: Secondary | ICD-10-CM | POA: Diagnosis not present

## 2013-05-31 DIAGNOSIS — S98139A Complete traumatic amputation of one unspecified lesser toe, initial encounter: Secondary | ICD-10-CM | POA: Diagnosis not present

## 2013-05-31 DIAGNOSIS — I798 Other disorders of arteries, arterioles and capillaries in diseases classified elsewhere: Secondary | ICD-10-CM | POA: Diagnosis not present

## 2013-05-31 DIAGNOSIS — B351 Tinea unguium: Secondary | ICD-10-CM | POA: Diagnosis not present

## 2013-05-31 DIAGNOSIS — R51 Headache: Secondary | ICD-10-CM | POA: Diagnosis not present

## 2013-05-31 DIAGNOSIS — R439 Unspecified disturbances of smell and taste: Secondary | ICD-10-CM | POA: Diagnosis not present

## 2013-05-31 DIAGNOSIS — E1159 Type 2 diabetes mellitus with other circulatory complications: Secondary | ICD-10-CM | POA: Diagnosis not present

## 2013-05-31 NOTE — Telephone Encounter (Signed)
Pt notified and verbalized understanding. Appt scheduled with Dr. Derrel Nip 06/15/13. Pt stated [facility] was having a problem with her nebulizer. [facility], states they need a diagnosis code, the one we provided does not work.

## 2013-05-31 NOTE — Telephone Encounter (Signed)
Left message for pt to return my call.

## 2013-06-01 NOTE — Telephone Encounter (Signed)
Refaxed nebulizer order to [facility] with different diagnosis code

## 2013-06-10 DIAGNOSIS — M542 Cervicalgia: Secondary | ICD-10-CM | POA: Diagnosis not present

## 2013-06-10 DIAGNOSIS — H6120 Impacted cerumen, unspecified ear: Secondary | ICD-10-CM | POA: Diagnosis not present

## 2013-06-10 DIAGNOSIS — H6980 Other specified disorders of Eustachian tube, unspecified ear: Secondary | ICD-10-CM | POA: Diagnosis not present

## 2013-06-14 ENCOUNTER — Telehealth: Payer: Self-pay | Admitting: Internal Medicine

## 2013-06-14 NOTE — Telephone Encounter (Signed)
Pt would like you to call her she has questions about the amount of insulin she is taking

## 2013-06-15 ENCOUNTER — Ambulatory Visit: Payer: Medicare Other | Admitting: Internal Medicine

## 2013-06-17 NOTE — Telephone Encounter (Signed)
Yes, continue her increased doses of insulin since her sugars are still running high.  If her fasting blood sugars drop  below 100,  Decreased the bedtime dose to 40 units

## 2013-06-17 NOTE — Telephone Encounter (Signed)
Patient notified to continue medication as instructed patient voiced understanding.

## 2013-06-17 NOTE — Telephone Encounter (Signed)
Patient stated her Insulin dose was increased while she was taking prednisone and that she was to continue this dose until follow up with MD. Patient appointment rescheduled until 07/01/13 Patient ask if she is to continue her insulin dose at 34 Units am and 42 units bedtime for CBG fasting AM 168 , 2 hour post prandail of 152 and bedtime cbg 187 Please advise.

## 2013-06-19 ENCOUNTER — Other Ambulatory Visit: Payer: Self-pay | Admitting: Internal Medicine

## 2013-07-01 ENCOUNTER — Encounter: Payer: Self-pay | Admitting: Internal Medicine

## 2013-07-01 ENCOUNTER — Ambulatory Visit (INDEPENDENT_AMBULATORY_CARE_PROVIDER_SITE_OTHER): Payer: Medicare Other | Admitting: Internal Medicine

## 2013-07-01 VITALS — BP 142/70 | HR 72 | Temp 98.0°F | Ht 63.0 in | Wt 188.8 lb

## 2013-07-01 DIAGNOSIS — R5383 Other fatigue: Secondary | ICD-10-CM

## 2013-07-01 DIAGNOSIS — Z23 Encounter for immunization: Secondary | ICD-10-CM | POA: Diagnosis not present

## 2013-07-01 DIAGNOSIS — J42 Unspecified chronic bronchitis: Secondary | ICD-10-CM | POA: Diagnosis not present

## 2013-07-01 DIAGNOSIS — E785 Hyperlipidemia, unspecified: Secondary | ICD-10-CM | POA: Diagnosis not present

## 2013-07-01 DIAGNOSIS — R5381 Other malaise: Secondary | ICD-10-CM | POA: Diagnosis not present

## 2013-07-01 DIAGNOSIS — E1165 Type 2 diabetes mellitus with hyperglycemia: Secondary | ICD-10-CM

## 2013-07-01 DIAGNOSIS — G4733 Obstructive sleep apnea (adult) (pediatric): Secondary | ICD-10-CM

## 2013-07-01 DIAGNOSIS — R1310 Dysphagia, unspecified: Secondary | ICD-10-CM

## 2013-07-01 DIAGNOSIS — E1129 Type 2 diabetes mellitus with other diabetic kidney complication: Secondary | ICD-10-CM

## 2013-07-01 LAB — COMPREHENSIVE METABOLIC PANEL
ALT: 19 U/L (ref 0–35)
AST: 20 U/L (ref 0–37)
Albumin: 3.4 g/dL — ABNORMAL LOW (ref 3.5–5.2)
Alkaline Phosphatase: 46 U/L (ref 39–117)
BUN: 16 mg/dL (ref 6–23)
CALCIUM: 9.5 mg/dL (ref 8.4–10.5)
CHLORIDE: 108 meq/L (ref 96–112)
CO2: 25 mEq/L (ref 19–32)
CREATININE: 0.9 mg/dL (ref 0.4–1.2)
GFR: 80.97 mL/min (ref >60.00)
Glucose, Bld: 75 mg/dL (ref 70–99)
Potassium: 4.3 mEq/L (ref 3.5–5.1)
Sodium: 140 mEq/L (ref 135–145)
Total Bilirubin: 0.9 mg/dL (ref 0.3–1.2)
Total Protein: 6.8 g/dL (ref 6.0–8.3)

## 2013-07-01 LAB — LDL CHOLESTEROL, DIRECT: Direct LDL: 98 mg/dL

## 2013-07-01 LAB — TSH: TSH: 1.16 u[IU]/mL (ref 0.35–5.50)

## 2013-07-01 LAB — HEMOGLOBIN A1C: Hgb A1c MFr Bld: 9.5 % — ABNORMAL HIGH (ref 4.6–6.5)

## 2013-07-01 LAB — HM DIABETES FOOT EXAM: HM DIABETIC FOOT EXAM: ABNORMAL

## 2013-07-01 NOTE — Progress Notes (Signed)
Pre-visit discussion using our clinic review tool. No additional management support is needed unless otherwise documented below in the visit note.  

## 2013-07-01 NOTE — Assessment & Plan Note (Signed)
Needs hoem nebilzier machine bc she cannot manage the pro air inhaler

## 2013-07-01 NOTE — Progress Notes (Signed)
Patient ID: Pamela Collier, female   DOB: 1941-03-14, 73 y.o.   MRN: [id number]  Patient Active Problem List   Diagnosis Date Noted  . Unspecified chronic bronchitis 07/01/2013  . Unsteady gait 03/24/2013  . History of colonic polyps 02/20/2013  . Need for prophylactic vaccination and inoculation against influenza 02/03/2013  . OSA (obstructive sleep apnea) 12/07/2012  . 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 Dysphagia 12/07/2012  . Pleural effusion 10/25/2012  . Cough 10/25/2012  . Hospital discharge follow-up 09/17/2012  . SOB (shortness of breath) 05/27/2012  . Pulmonary nodule seen on imaging study 05/20/2012  . Heart failure, diastolic, acute [id number]  . 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 Cardiomyopathy, ischemic   . Hepatic steatosis   . Osteoporosis, post-menopausal   . 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 Atherosclerotic peripheral vascular disease with gangrene   . DM (diabetes mellitus), type 2 with renal complications [id number]  . Double vessel coronary artery disease 12/14/2011    Subjective:  CC:   Chief Complaint  Patient presents with  . Follow-up    3 mth f/u    HPI:   Pamela Collier is a 73 y.o. female who presents for  3 month follow up on DM Type 2 with CKD and other chronic conditions including  ischemic cardiomyopathy with decreased EF,  PAD ad fatty liver.  She is currently taking 34 units in the am and 42 units in the pm of  70/30 insulin since January .  Her fastings have been 140 to 160 .  Pre dinner sugars have been 170.  She has had no hypoglycemic events . She often skips lunch or breakfast because she is not hungry .  She is not sure if she should be taking additional does of furosemide .     Past Medical History  Diagnosis Date  . Diabetes mellitus   . 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 Cardiomyopathy,  ischemic Jan 2013    EF 35 to 40% [facility])  . Hepatic steatosis     by CT abd pelvis  . Osteoporosis, post-menopausal   . 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 Abdominal hysterectomy      at ge 39. secondary to bleeding  . Toe amputation  Sept 2012    Right 2nd toe, [place]  . Ptca  August 2012    Right Posterior tibial artery , [place]       The following portions of the patient's history were reviewed and updated as appropriate: Allergies, current medications, and problem list.    Review of Systems:   Patient denies headache, fevers, malaise, unintentional weight loss, skin rash, eye pain, sinus congestion and sinus pain, sore throat, dysphagia,  hemoptysis , cough, dyspnea, wheezing, chest pain, palpitations, orthopnea, edema, abdominal pain, nausea, melena, diarrhea, constipation, flank pain, dysuria, hematuria, urinary  Frequency, nocturia, numbness, tingling, seizures,  Focal weakness, Loss of consciousness,  Tremor, insomnia, depression, anxiety, and suicidal ideation.     History   Social History  . Marital Status: Married    Spouse Name: N/A    Number of Children: N/A  . Years of Education: N/A  Occupational History  . Not on file.   Social History Main Topics  . Smoking status: Never Smoker   . Smokeless tobacco: Never Used  . Alcohol Use: No  . Drug Use: No  . Sexual Activity: Yes   Other Topics Concern  . Not on file   Social History Narrative  . No narrative on file    Objective:  Filed Vitals:   07/01/13 1127  BP: 142/70  Pulse: 72  Temp: 98 F (36.7 C)     General appearance: alert, cooperative and appears stated age Ears: normal TM's and external ear canals both ears Throat: lips, mucosa, and tongue normal; teeth and gums  normal Neck: no adenopathy, no carotid bruit, supple, symmetrical, trachea midline and thyroid not enlarged, symmetric, no tenderness/mass/nodules Back: symmetric, no curvature. ROM normal. No CVA tenderness. Lungs: clear to auscultation bilaterally Heart: regular rate and rhythm, S1, S2 normal, no murmur, click, rub or gallop Abdomen: soft, non-tender; bowel sounds normal; no masses,  no organomegaly Pulses: 2+ and symmetric Skin: Skin color, texture, turgor normal. No rashes or lesions Lymph nodes: Cervical, supraclavicular, and axillary nodes normal.  Assessment and Plan:  Unspecified chronic bronchitis Needs hoem nebilzier machine bc she cannot manage the pro air inhaler   DM (diabetes mellitus), type 2 with renal complications Advised to increase her evening dose of insulin to 45 units and continue 34 units in the morning for now. She is overdue for a1c . She is up to date on eye exams and sees dr Vickki Muff regularly for foot care.  Lab Results  Component Value Date   HGBA1C 8.1* 02/03/2013     Dysphagia symptoms have improved with protonix.    Updated Medication List Outpatient Encounter Prescriptions as of 07/01/2013  Medication Sig  . ALPRAZolam (XANAX) 0.5 MG tablet Take 1 tablet (0.5 mg total) by mouth daily as needed for sleep or anxiety.  [name] aspirin 81 MG EC tablet Take 1 tablet (81 mg total) by mouth daily. Swallow whole.  . B-D ULTRAFINE III SHORT PEN 31G X 8 MM MISC   . fluticasone (FLONASE) 50 MCG/ACT nasal spray Place 2 sprays into the nose daily.  . furosemide (LASIX) 20 MG tablet Take 20 mg by mouth daily.   [name] glucose blood test strip Check blood sugar 3 times a day DX CODE: 250.0  . INS SYRINGE/NEEDLE 1CC/28G 28G X 1/2" 1 ML MISC 1 each by Does not apply route as needed. To test glucose levels daily.  . Insulin Aspart Prot & Aspart (NOVOLOG 70/30 MIX) (70-30) 100 UNIT/ML Pen 34 units before breakfast,  42 units before dinner  . Ipratropium-Albuterol (COMBIVENT  RESPIMAT) 20-100 MCG/ACT AERS respimat Inhale 1 puff into the lungs every 6 (six) hours.  . isosorbide mononitrate (IMDUR) 30 MG 24 hr tablet Take 30 mg by mouth daily.  . metoprolol (LOPRESSOR) 50 MG tablet Take 0.5 tablets (25 mg total) by mouth 2 (two) times daily.  [name] olmesartan (BENICAR) 20 MG tablet Take 1 tablet (20 mg total) by mouth daily.  [name] Phenylephrine-DM-GG-APAP 5-10-200-325 MG TABS Per bottle directions as needed  . pregabalin (LYRICA) 100 MG capsule Take 1 capsule (100 mg total) by mouth 3 (three) times daily.  . rosuvastatin (CRESTOR) 10 MG tablet Take 1 tablet (10 mg total) by mouth daily.  . TRUETEST TEST test strip CHECK BLOOD GLUCOSE THREE TIMES DAILY  . [DISCONTINUED] gabapentin (NEURONTIN) 100 MG capsule Take 100 mg by mouth 2 (two) times daily.  . [DISCONTINUED] predniSONE (  DELTASONE) 20 MG tablet Take 1 tablet (20 mg total) by mouth 2 (two) times daily with a meal.     Orders Placed This Encounter  Procedures  . DME Nebulizer machine  . Pneumococcal conjugate vaccine 13-valent  . Microalbumin / creatinine urine ratio  . LDL cholesterol, direct  . Hemoglobin A1c  . Comprehensive metabolic panel  . TSH  . HM DIABETES FOOT EXAM    No Follow-up on file.

## 2013-07-01 NOTE — Patient Instructions (Addendum)
Increase you evening insulin dose to 45 units.  Leave the morning dose at 34 units  You should not skip meals.  This will cause low blood sugars  When you are not hungry enough to eat.  Drink a glucerna or an Atkins protein shake or any shake for diabetics.    Add a piece of cheese to your breakfast oatmeal.   Cut the bananas out they are full of sugar  Stop the gabapentin ,  You are allergic to it,   If your weight increases by 2 lbs over night ,  Increase your lasix to 2 pills daily until your weight drops down again  To your baseline

## 2013-07-03 ENCOUNTER — Encounter: Payer: Self-pay | Admitting: Internal Medicine

## 2013-07-03 NOTE — Assessment & Plan Note (Signed)
symptoms have improved with protonix.

## 2013-07-03 NOTE — Assessment & Plan Note (Addendum)
Advised to increase her evening dose of insulin to 45 units and continue 34 units in the morning for now. She is overdue for a1c . She is up to date on eye exams and sees dr Vickki Muff regularly for foot care.  Lab Results  Component Value Date   HGBA1C 8.1* 02/03/2013

## 2013-07-03 NOTE — Assessment & Plan Note (Signed)
Diagnosed by sleep study. She is tolerating her CPAP every night a minimum of 6 hours per night.

## 2013-07-04 LAB — MICROALBUMIN / CREATININE URINE RATIO
Creatinine,U: 117.8 mg/dL
Microalb Creat Ratio: 135.4 mg/g — ABNORMAL HIGH (ref 0.0–30.0)

## 2013-07-05 MED ORDER — INSULIN ASPART PROT & ASPART (70-30 MIX) 100 UNIT/ML PEN: PEN_INJECTOR | SUBCUTANEOUS | Status: DC

## 2013-07-05 NOTE — Addendum Note (Signed)
Addended by: [name] on: 07/05/2013 11:32 AM   Modules accepted: Orders

## 2013-07-06 ENCOUNTER — Other Ambulatory Visit: Payer: Self-pay | Admitting: *Deleted

## 2013-07-06 MED ORDER — LANCETS 30G MISC: Status: DC

## 2013-07-06 NOTE — Telephone Encounter (Signed)
Refill from pharmacy for basic lancet 30 g

## 2013-07-20 ENCOUNTER — Ambulatory Visit: Payer: Medicare Other | Admitting: Internal Medicine

## 2013-07-28 IMAGING — CR DG CHEST 2V
1 series · 2 of 2 positions shown · non-contrast
Comparison: none

REASON FOR EXAM: shob
COMMENTS:

[Series 1: view not recorded · 0.17mm/px · 2 of 2 slices shown]
[im 1/2]
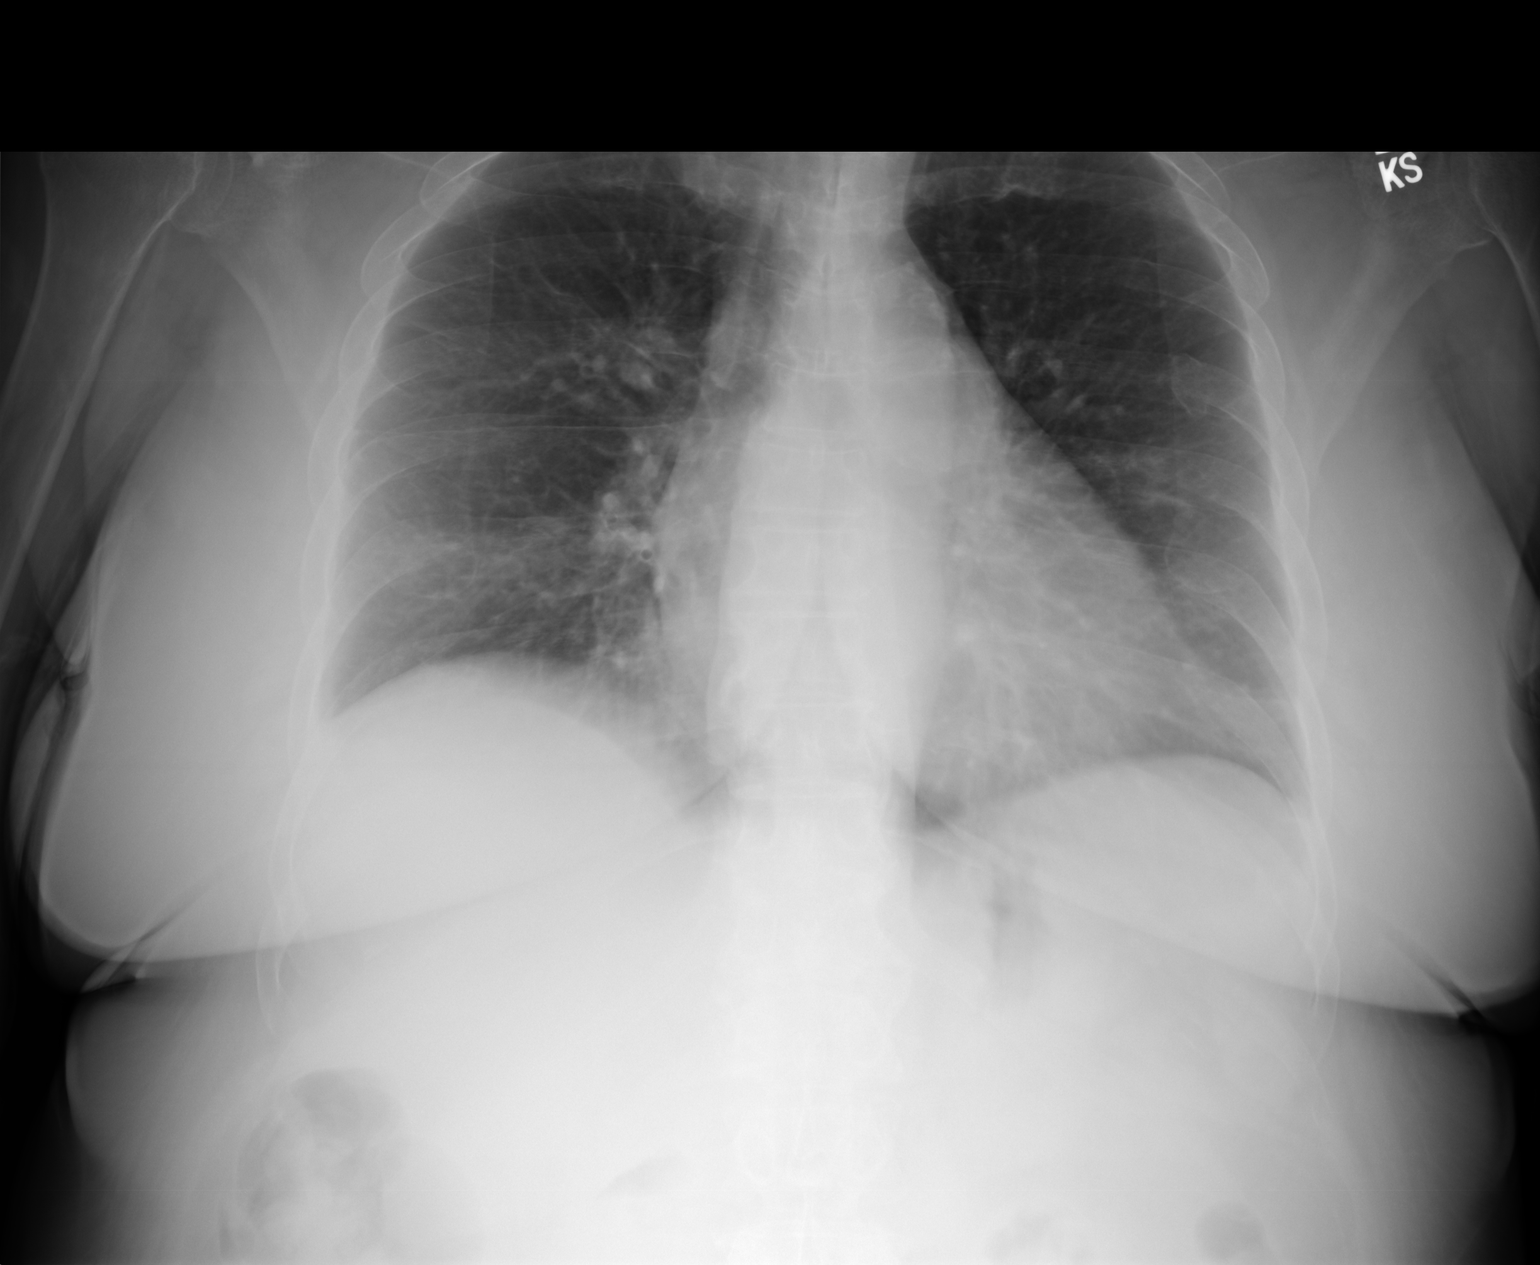
[im 2/2]
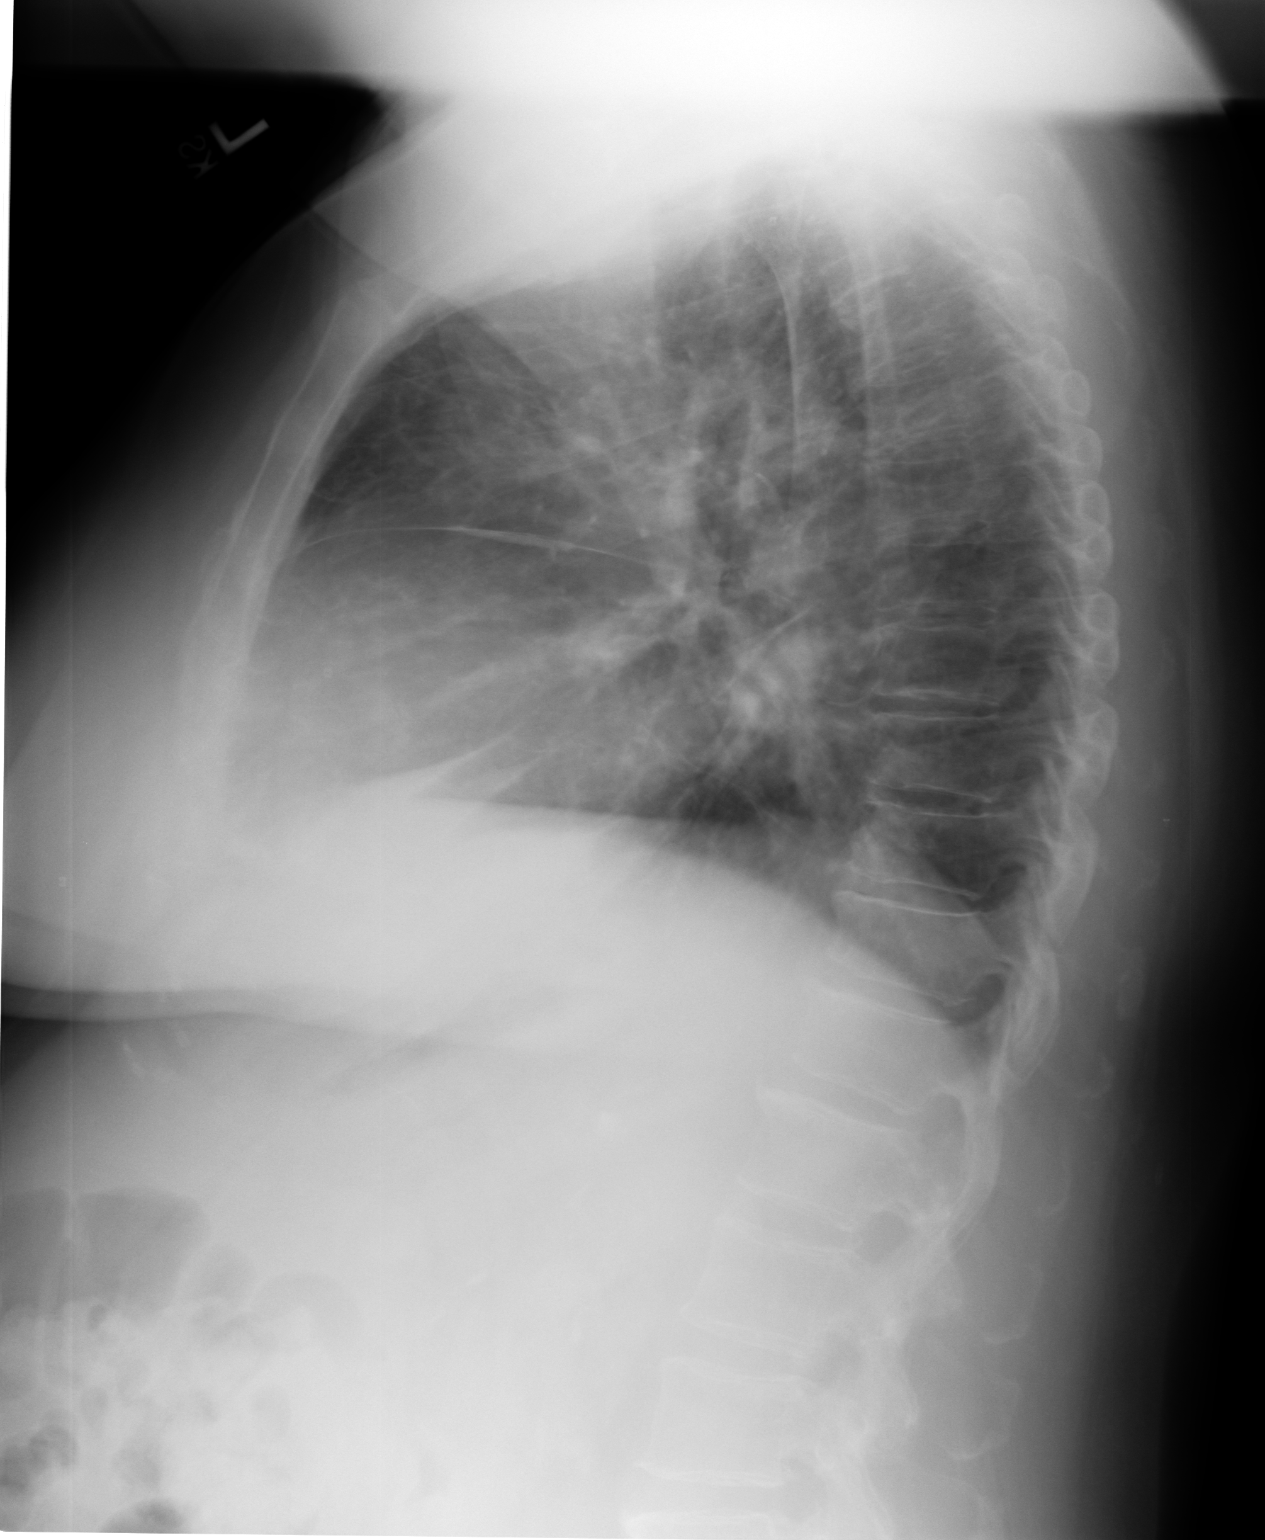

[2 of 2 positions shown; findings below may reference images not displayed]

PROCEDURE:     DXR - DXR CHEST PA (OR AP) AND LATERAL  - April 05, 2011  [DATE]

RESULT:

Comparison is made to the previous exam of 20 October, 2007.

A lordotic projection is obtained. The lungs appear clear on the frontal
view with slight thickening of the minor fissure noted. On the lateral view,
there is some thickening of the major fissures as well. No definite
consolidation, effusion or pneumothorax is present.
IMPRESSION: Minimal thickening of the fissures of the lungs. Trace
pleural effusion is not excluded.

## 2013-07-29 ENCOUNTER — Other Ambulatory Visit: Payer: Self-pay | Admitting: Internal Medicine

## 2013-08-03 ENCOUNTER — Encounter: Payer: Self-pay | Admitting: Internal Medicine

## 2013-08-03 ENCOUNTER — Ambulatory Visit (INDEPENDENT_AMBULATORY_CARE_PROVIDER_SITE_OTHER): Payer: Medicare Other | Admitting: Internal Medicine

## 2013-08-03 VITALS — BP 158/70 | HR 68 | Temp 98.1°F | Resp 18 | Ht 66.75 in | Wt 189.2 lb

## 2013-08-03 DIAGNOSIS — E1165 Type 2 diabetes mellitus with hyperglycemia: Principal | ICD-10-CM

## 2013-08-03 DIAGNOSIS — M1990 Unspecified osteoarthritis, unspecified site: Secondary | ICD-10-CM

## 2013-08-03 DIAGNOSIS — M129 Arthropathy, unspecified: Secondary | ICD-10-CM | POA: Diagnosis not present

## 2013-08-03 DIAGNOSIS — E1129 Type 2 diabetes mellitus with other diabetic kidney complication: Secondary | ICD-10-CM | POA: Diagnosis not present

## 2013-08-03 NOTE — Progress Notes (Signed)
Patient ID: Pamela Collier, female   DOB: 1940/05/31, 73 y.o.   MRN: [id number]   Patient Active Problem List   Diagnosis Date Noted  . Unspecified chronic bronchitis 07/01/2013  . Unsteady gait 03/24/2013  . History of colonic polyps 02/20/2013  . Need for prophylactic vaccination and inoculation against influenza 02/03/2013  . OSA (obstructive sleep apnea) 12/07/2012  . 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 Dysphagia 12/07/2012  . Pleural effusion 10/25/2012  . Cough 10/25/2012  . Hospital discharge follow-up 09/17/2012  . SOB (shortness of breath) 05/27/2012  . Pulmonary nodule seen on imaging study 05/20/2012  . Heart failure, diastolic, acute [id number]  . 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 Cardiomyopathy, ischemic   . Hepatic steatosis   . Osteoporosis, post-menopausal   . 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 Atherosclerotic peripheral vascular disease with gangrene   . Type II or unspecified type diabetes mellitus with renal manifestations, uncontrolled 12/14/2011  . Double vessel coronary artery disease 12/14/2011    Subjective:  CC:   Chief Complaint  Patient presents with  . Follow-up  . Diabetes    HPI:   Pamela Collier is a 73 y.o. female who presents for Follow up onUncontrolled DM.  Caressa has brought a log of her blood sugars along with a diary of her meals. Her diet is significant for  Several complex carbohydrate servings daily including white potatoes .  She has trouble with morning anorexia and often skips breakfast and takes her morning insulin dose late.  Her fasting sugars are persistently elevated , from 140 to 160.    She is also reporting painful swollen knuckle for the past few days.  No truma . No history of gout.      Past Medical History  Diagnosis Date  . Diabetes mellitus   . Coronary artery disease, occlusive Jan 2013    severe RCA stenosis ,  mod LAD     . Cardiomyopathy, ischemic Jan 2013    EF 35 to 40% [facility])  . Hepatic steatosis     by CT abd pelvis  . Osteoporosis, post-menopausal   . 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 Abdominal hysterectomy      at ge 39. secondary to bleeding  . Toe amputation  Sept 2012    Right 2nd toe, [place]  . Ptca  August 2012    Right Posterior tibial artery , [place]       The following portions of the patient's history were reviewed and updated as appropriate: Allergies, current medications, and problem list.    Review of Systems:   Patient denies headache, fevers, malaise, unintentional weight loss, skin rash, eye pain, sinus congestion and sinus pain, sore throat, dysphagia,  hemoptysis , cough, dyspnea, wheezing, chest pain, palpitations, orthopnea, edema, abdominal pain, nausea, melena, diarrhea, constipation, flank pain, dysuria, hematuria, urinary  Frequency, nocturia, numbness, tingling, seizures,  Focal weakness, Loss of consciousness,  Tremor, insomnia, depression, anxiety, and suicidal ideation.     History   Social History  . Marital Status: Married    Spouse Name: N/A    Number of Children: N/A  . Years of Education: N/A   Occupational History  . Not on file.  Social History Main Topics  . Smoking status: Never Smoker   . Smokeless tobacco: Never Used  . Alcohol Use: No  . Drug Use: No  . Sexual Activity: Yes   Other Topics Concern  . Not on file   Social History Narrative  . No narrative on file    Objective:  Filed Vitals:   08/03/13 1433  BP: 158/70  Pulse: 68  Temp: 98.1 F (36.7 C)  Resp: 18     General appearance: alert, cooperative and appears stated age Ears: normal TM's and external ear canals both ears Throat: lips, mucosa, and  tongue normal; teeth and gums normal Neck: no adenopathy, no carotid bruit, supple, symmetrical, trachea midline and thyroid not enlarged, symmetric, no tenderness/mass/nodules Back: symmetric, no curvature. ROM normal. No CVA tenderness. Lungs: clear to auscultation bilaterally Heart: regular rate and rhythm, S1, S2 normal, no murmur, click, rub or gallop Abdomen: soft, non-tender; bowel sounds normal; no masses,  no organomegaly Pulses: 2+ and symmetric Skin: Skin color, texture, turgor normal. No rashes or lesions Ext: right ring finger with synovitis of PIPl Lymph nodes: Cervical, supraclavicular, and axillary nodes normal.  Assessment and Plan:  Type II or unspecified type diabetes mellitus with renal manifestations, uncontrolled Diet and regimen discussed,  Recommended use of protein shakes in the am instead of skipping breakfast.  evening dose of insulin increased to 48 units.   Arthritis Joint is not warm or red.  Trial of advil or aleve.    Updated Medication List Outpatient Encounter Prescriptions as of 08/03/2013  Medication Sig  . ALPRAZolam (XANAX) 0.5 MG tablet Take 1 tablet (0.5 mg total) by mouth daily as needed for sleep or anxiety.  [name] aspirin 81 MG EC tablet Take 1 tablet (81 mg total) by mouth daily. Swallow whole.  . B-D ULTRAFINE III SHORT PEN 31G X 8 MM MISC   . fluticasone (FLONASE) 50 MCG/ACT nasal spray Place 2 sprays into the nose daily.  . furosemide (LASIX) 20 MG tablet Take 20 mg by mouth daily.   . furosemide (LASIX) 20 MG tablet TAKE ONE TABLET BY MOUTH EVERY DAY  . glucose blood test strip Check blood sugar 3 times a day DX CODE: 250.0  . INS SYRINGE/NEEDLE 1CC/28G 28G X 1/2" 1 ML MISC 1 each by Does not apply route as needed. To test glucose levels daily.  . Insulin Aspart Prot & Aspart (NOVOLOG 70/30 MIX) (70-30) 100 UNIT/ML Pen 37 units before breakfast,  45 units before dinner.  NEEDS 10 PENS PER MONTH34 units before breakfast,  42 units before dinner   . Ipratropium-Albuterol (COMBIVENT RESPIMAT) 20-100 MCG/ACT AERS respimat Inhale 1 puff into the lungs every 6 (six) hours.  . isosorbide mononitrate (IMDUR) 30 MG 24 hr tablet Take 30 mg by mouth daily.  . Lancets 30G MISC Check blood glucose TID. Dx 250.00  . metoprolol (LOPRESSOR) 50 MG tablet Take 0.5 tablets (25 mg total) by mouth 2 (two) times daily.  [name] olmesartan (BENICAR) 20 MG tablet Take 1 tablet (20 mg total) by mouth daily.  . pregabalin (LYRICA) 100 MG capsule Take 1 capsule (100 mg total) by mouth 3 (three) times daily.  . rosuvastatin (CRESTOR) 10 MG tablet Take 1 tablet (10 mg total) by mouth daily.  . TRUETEST TEST test strip CHECK BLOOD GLUCOSE THREE TIMES DAILY  . Phenylephrine-DM-GG-APAP 5-10-200-325 MG TABS Per bottle directions as needed

## 2013-08-03 NOTE — Progress Notes (Signed)
Pre-visit discussion using our clinic review tool. No additional management support is needed unless otherwise documented below in the visit note.  

## 2013-08-03 NOTE — Patient Instructions (Addendum)
Your fasting sugars are still too high,  as well as your dinner  Sugars.  Your diet is fine,  Keep avoiding breaded meats and white potatoes  Please increase your evening pre-dinner  insulin dose to 48 units. This will improve your evening sugars and your fasting   Here are three protein drinks that you can substitute for breakfast when you are not hungry  Glucerna  Atkins advantEdge carb control   EAS carb control   The last 2 can be found at [employer] and [employer]   If you use use Atkins shakes or  the EAS AdvantEdge ,, you will need to reduce your morning insulin shot to 20 units to avoid a low blood sugar  You can use Advil 400 mg  Or Aleve  As direcgted om the bottle for knuckel swelling due to arthritis (whichever did not cause your facial rash)

## 2013-08-04 DIAGNOSIS — R51 Headache: Secondary | ICD-10-CM | POA: Diagnosis not present

## 2013-08-04 DIAGNOSIS — R259 Unspecified abnormal involuntary movements: Secondary | ICD-10-CM | POA: Diagnosis not present

## 2013-08-04 DIAGNOSIS — E669 Obesity, unspecified: Secondary | ICD-10-CM | POA: Diagnosis not present

## 2013-08-04 DIAGNOSIS — R262 Difficulty in walking, not elsewhere classified: Secondary | ICD-10-CM | POA: Diagnosis not present

## 2013-08-05 DIAGNOSIS — M1990 Unspecified osteoarthritis, unspecified site: Secondary | ICD-10-CM | POA: Insufficient documentation

## 2013-08-05 HISTORY — DX: Unspecified osteoarthritis, unspecified site: M19.90

## 2013-08-05 NOTE — Assessment & Plan Note (Signed)
Joint is not warm or red.  Trial of advil or aleve.

## 2013-08-05 NOTE — Assessment & Plan Note (Addendum)
Diet and regimen discussed,  Recommended use of protein shakes in the am instead of skipping breakfast.  evening dose of insulin increased to 48 units.

## 2013-08-15 ENCOUNTER — Telehealth: Payer: Self-pay | Admitting: *Deleted

## 2013-08-15 ENCOUNTER — Telehealth: Payer: Self-pay | Admitting: Internal Medicine

## 2013-08-15 MED ORDER — ROSUVASTATIN CALCIUM 10 MG PO TABS: 10.0000 mg | ORAL_TABLET | Freq: Every day | ORAL | Status: DC

## 2013-08-15 MED ORDER — ALBUTEROL SULFATE (2.5 MG/3ML) 0.083% IN NEBU: 2.5000 mg | INHALATION_SOLUTION | Freq: Four times a day (QID) | RESPIRATORY_TRACT | Status: DC | PRN

## 2013-08-15 NOTE — Telephone Encounter (Signed)
Also sent refill request for Crestor

## 2013-08-15 NOTE — Telephone Encounter (Signed)
Patient notified both scripts have been sent to pharmacy as requested.

## 2013-08-15 NOTE — Telephone Encounter (Signed)
Left msg on vm asking for return call.  States it is regarding a prescription for her medication to help with wheezing.

## 2013-08-15 NOTE — Telephone Encounter (Signed)
Neb solution

## 2013-08-15 NOTE — Telephone Encounter (Signed)
Neb solution sent to [facility]

## 2013-08-17 DIAGNOSIS — I208 Other forms of angina pectoris: Secondary | ICD-10-CM | POA: Insufficient documentation

## 2013-08-17 DIAGNOSIS — B351 Tinea unguium: Secondary | ICD-10-CM | POA: Insufficient documentation

## 2013-08-17 DIAGNOSIS — E669 Obesity, unspecified: Secondary | ICD-10-CM | POA: Insufficient documentation

## 2013-08-17 DIAGNOSIS — I2089 Other forms of angina pectoris: Secondary | ICD-10-CM | POA: Insufficient documentation

## 2013-08-17 DIAGNOSIS — M898X9 Other specified disorders of bone, unspecified site: Secondary | ICD-10-CM | POA: Insufficient documentation

## 2013-08-17 DIAGNOSIS — I639 Cerebral infarction, unspecified: Secondary | ICD-10-CM

## 2013-08-17 HISTORY — DX: Cerebral infarction, unspecified: I63.9

## 2013-08-23 ENCOUNTER — Encounter: Payer: Self-pay | Admitting: Neurology

## 2013-08-23 DIAGNOSIS — M6281 Muscle weakness (generalized): Secondary | ICD-10-CM | POA: Diagnosis not present

## 2013-08-23 DIAGNOSIS — R262 Difficulty in walking, not elsewhere classified: Secondary | ICD-10-CM | POA: Diagnosis not present

## 2013-08-23 DIAGNOSIS — IMO0001 Reserved for inherently not codable concepts without codable children: Secondary | ICD-10-CM | POA: Diagnosis not present

## 2013-09-02 DIAGNOSIS — M549 Dorsalgia, unspecified: Secondary | ICD-10-CM | POA: Diagnosis not present

## 2013-09-02 DIAGNOSIS — S98139A Complete traumatic amputation of one unspecified lesser toe, initial encounter: Secondary | ICD-10-CM | POA: Diagnosis not present

## 2013-09-02 DIAGNOSIS — M5137 Other intervertebral disc degeneration, lumbosacral region: Secondary | ICD-10-CM | POA: Diagnosis not present

## 2013-09-02 DIAGNOSIS — D2370 Other benign neoplasm of skin of unspecified lower limb, including hip: Secondary | ICD-10-CM | POA: Diagnosis not present

## 2013-09-02 DIAGNOSIS — M47817 Spondylosis without myelopathy or radiculopathy, lumbosacral region: Secondary | ICD-10-CM | POA: Diagnosis not present

## 2013-09-02 DIAGNOSIS — B351 Tinea unguium: Secondary | ICD-10-CM | POA: Diagnosis not present

## 2013-09-05 ENCOUNTER — Telehealth: Payer: Self-pay | Admitting: Internal Medicine

## 2013-09-05 MED ORDER — OLMESARTAN MEDOXOMIL 20 MG PO TABS: 20.0000 mg | ORAL_TABLET | Freq: Every day | ORAL | Status: DC

## 2013-09-05 NOTE — Telephone Encounter (Signed)
Refill sent for Benicar as requested.

## 2013-09-08 IMAGING — CR DG CHEST 1V PORT
1 series · 1 of 1 positions shown · non-contrast
Comparison: none

REASON FOR EXAM: chest pain
COMMENTS:

[portable]
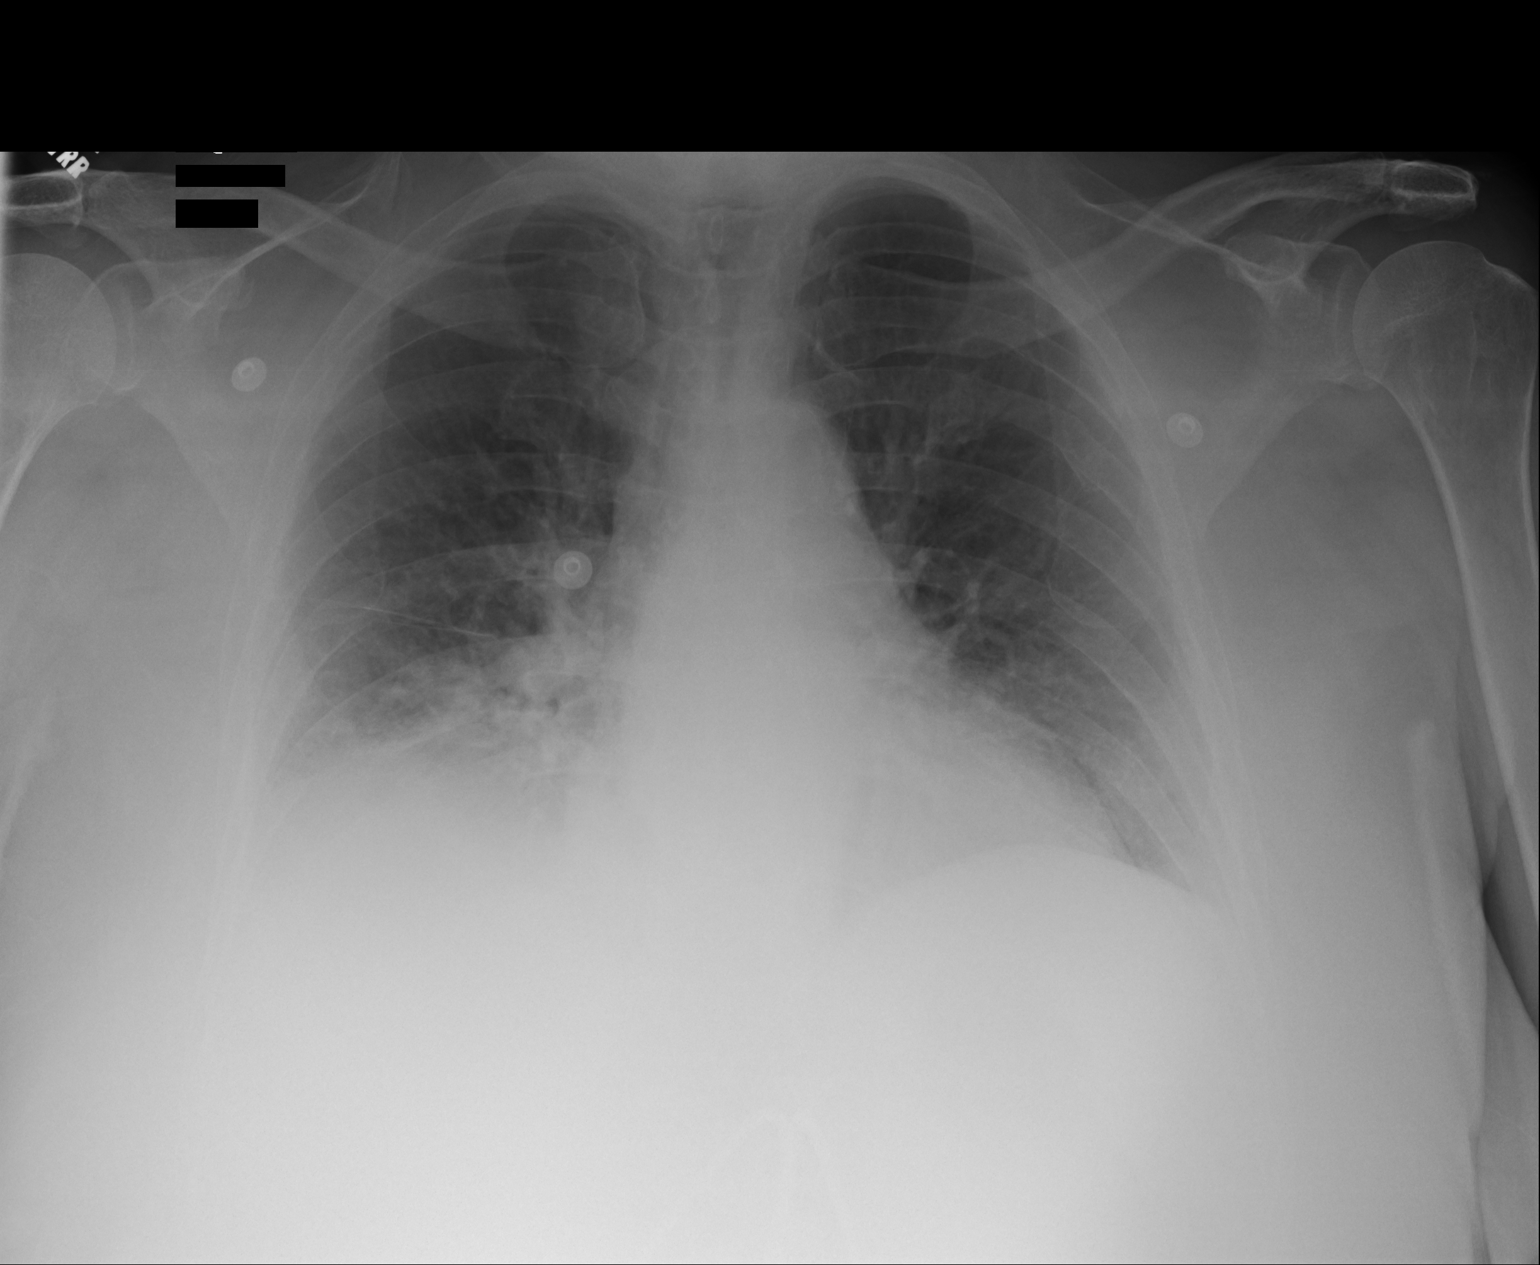

[1 of 1 positions shown; findings below may reference images not displayed]

PROCEDURE:     DXR - DXR PORTABLE CHEST SINGLE VIEW  - May 17, 2011  [DATE]

RESULT:     Comparison is made to the study 05 April, 2011.

There is increased density at the right lung base suspicious for atelectasis
versus minimal infiltrate. The heart is within normal limits of size. There
is no edema, effusion or pneumothorax. Appear to be old healed posterior
left fourth and fifth rib fractures.
IMPRESSION: 1. Right lung base infiltrate versus pneumonia.
2. Old healed left rib fractures.

## 2013-09-09 ENCOUNTER — Encounter: Payer: Self-pay | Admitting: Neurology

## 2013-09-09 DIAGNOSIS — M6281 Muscle weakness (generalized): Secondary | ICD-10-CM | POA: Diagnosis not present

## 2013-09-09 DIAGNOSIS — R262 Difficulty in walking, not elsewhere classified: Secondary | ICD-10-CM | POA: Diagnosis not present

## 2013-09-09 DIAGNOSIS — IMO0001 Reserved for inherently not codable concepts without codable children: Secondary | ICD-10-CM | POA: Diagnosis not present

## 2013-09-16 IMAGING — CR DG ABDOMEN 3V
1 series · 6 of 6 positions shown · non-contrast
Comparison: none

REASON FOR EXAM: cough, vomiting
COMMENTS:

PROCEDURE:     DXR - DXR ABDOMEN 3-WAY (INCL PA CXR)  - May 25, 2011  [DATE]
RESULT:     Comparison: None.

[Series 1: w chest pa · 0.14mm/px · 6 of 6 slices shown]
[im 1/6]
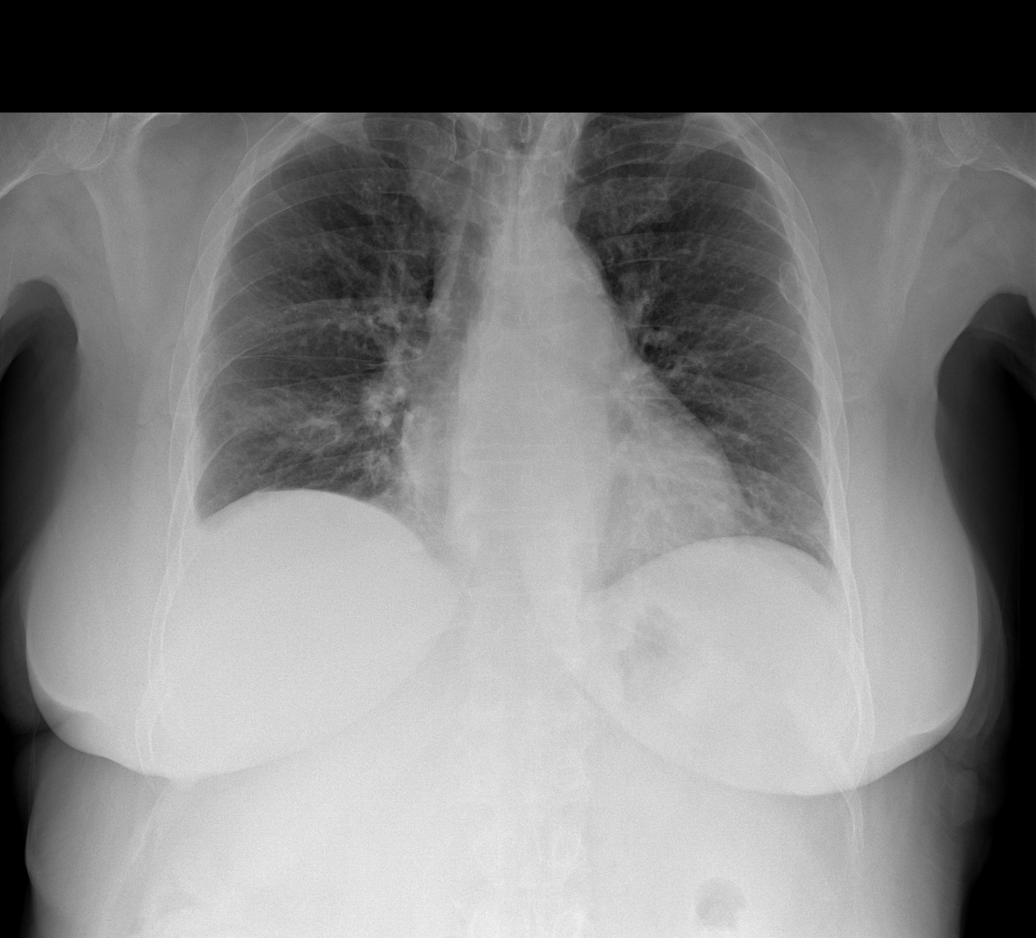
[im 2/6]
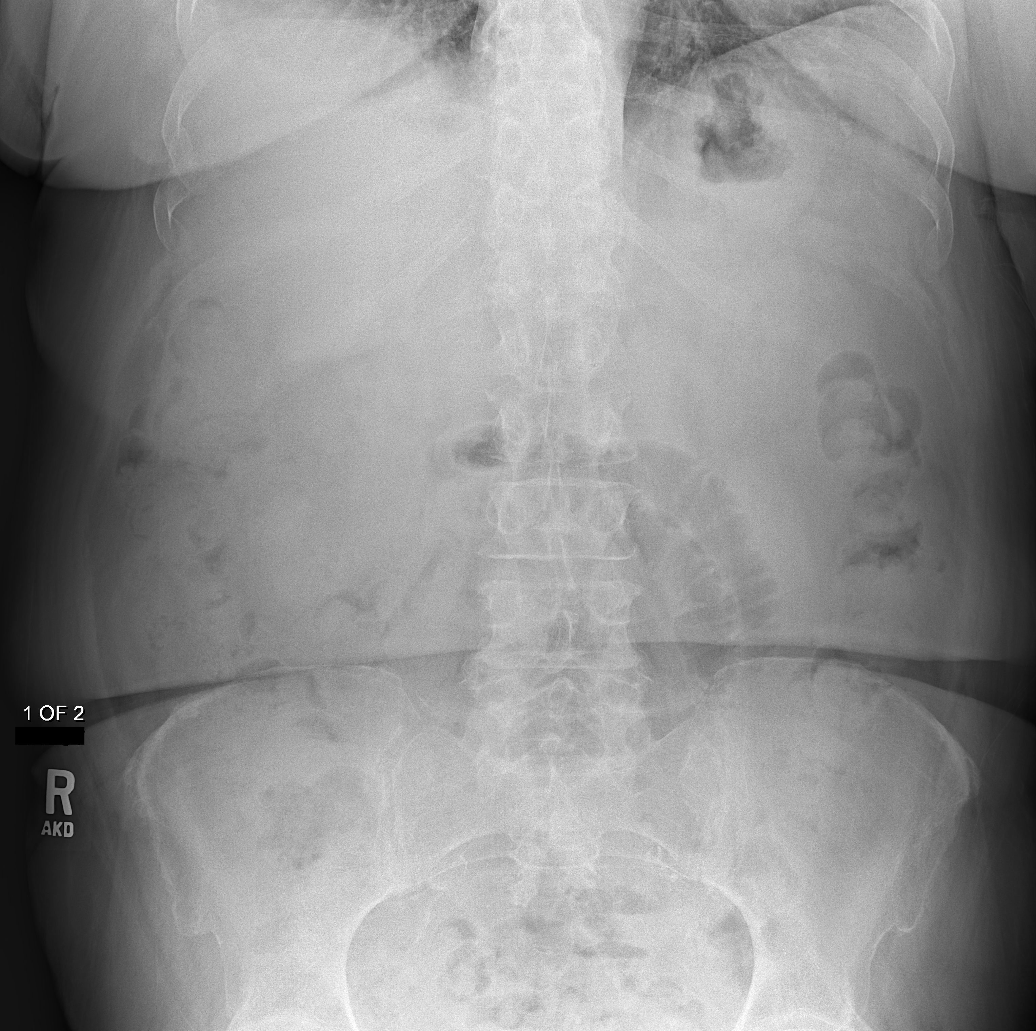
[im 3/6]
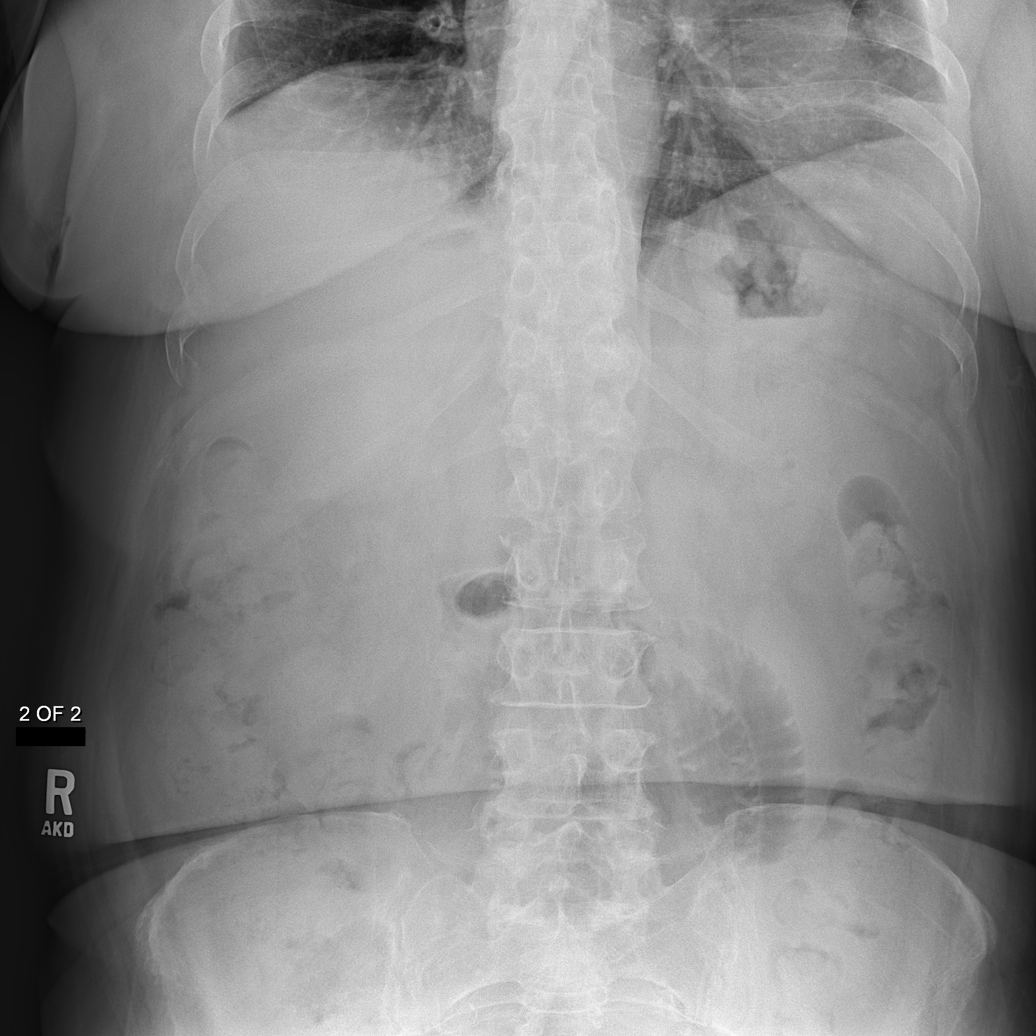
[im 4/6]
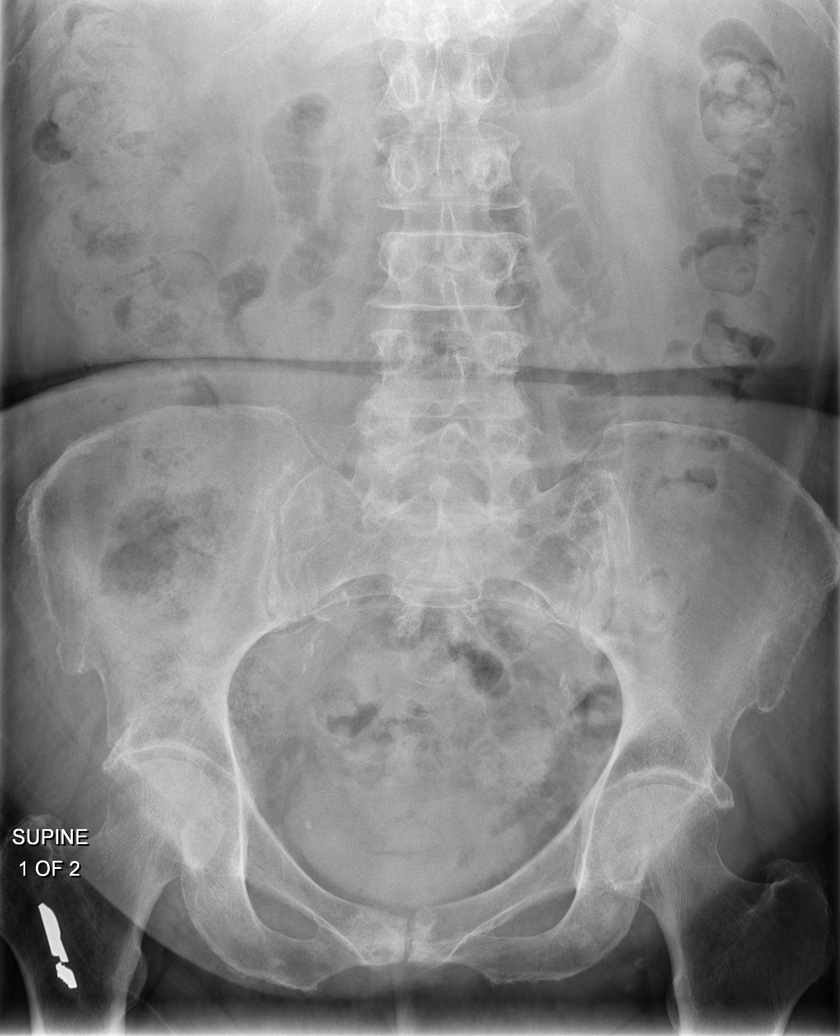
[im 5/6]
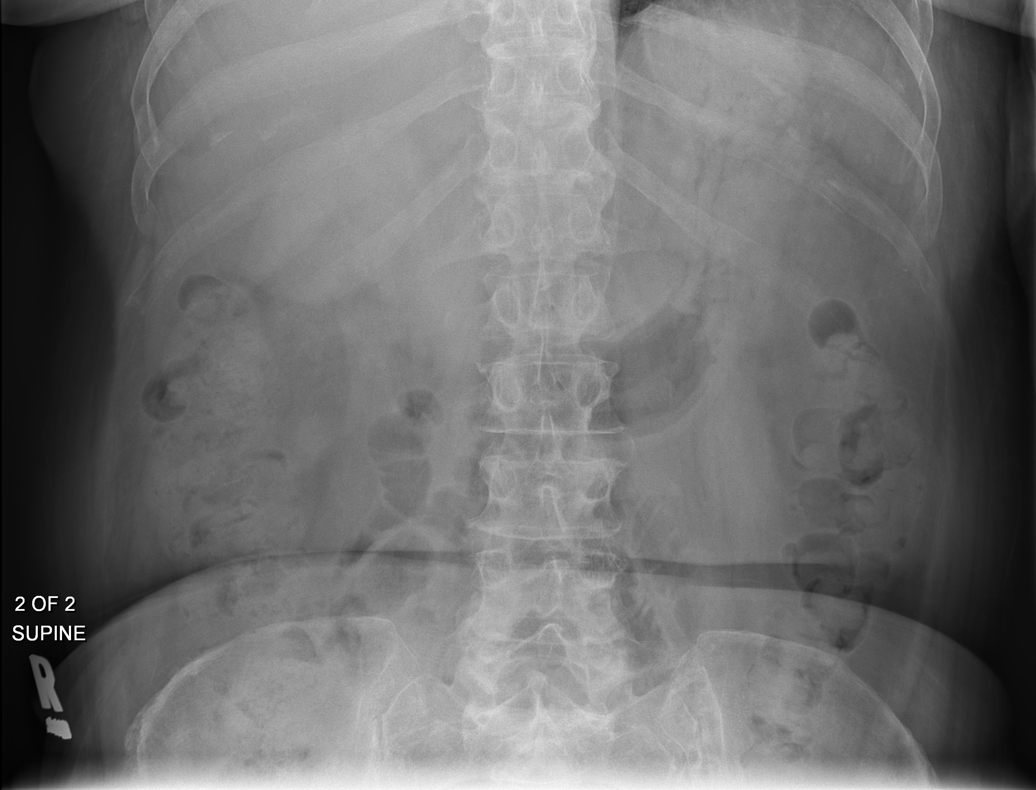
[im 6/6]
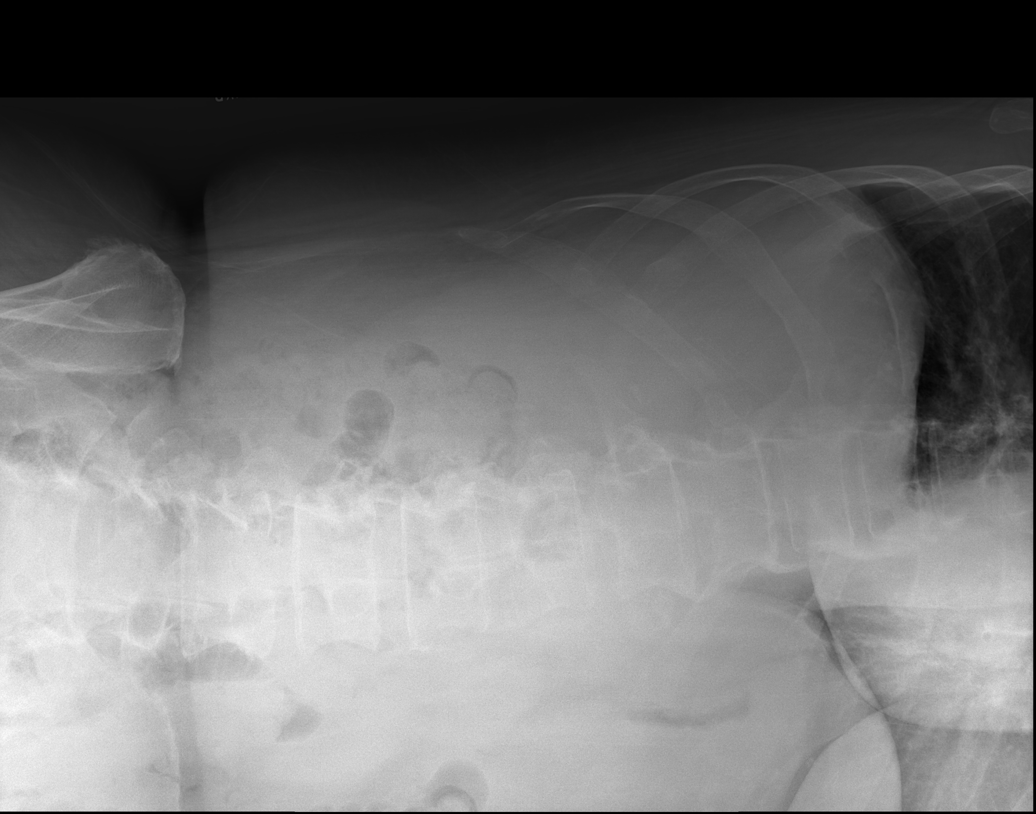

[6 of 6 positions shown; findings below may reference images not displayed]

FINDINGS: The lung volumes are low. Mild basilar reticular opacities like secondary to
subsegmental atelectasis. No free intraperitoneal air. Air is seen within
nondilated small and large bowel.
IMPRESSION: 1. Nonobstructed bowel gas pattern.
2. Mild basilar pulmonary opacities likely secondary to atelectasis.
Consider followup chest radiograph.

.

## 2013-09-16 IMAGING — CT CT HEAD WITHOUT CONTRAST
2 series · 16 of 30 positions shown, 20 images · non-contrast
Comparison: none

REASON FOR EXAM: headache
COMMENTS:

PROCEDURE:     CT  - CT HEAD WITHOUT CONTRAST  - May 25, 2011  [DATE]
RESULT:     Comparison:  07/08/2007
TECHNIQUE: Multiple axial images from the foramen magnum to the vertex were
obtained without IV contrast.

[Series 2: without · axial · non-contrast · 0.43mm/px · z∈[-16,+110]mm · 13 of 31 slices shown, 17 images]
[im 3/31  brain]
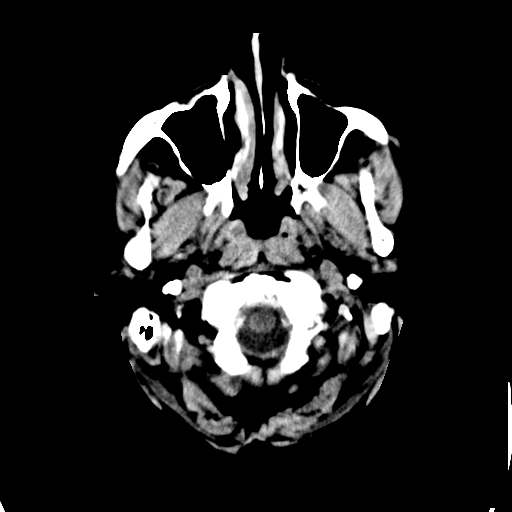
[im 3/31  bone]
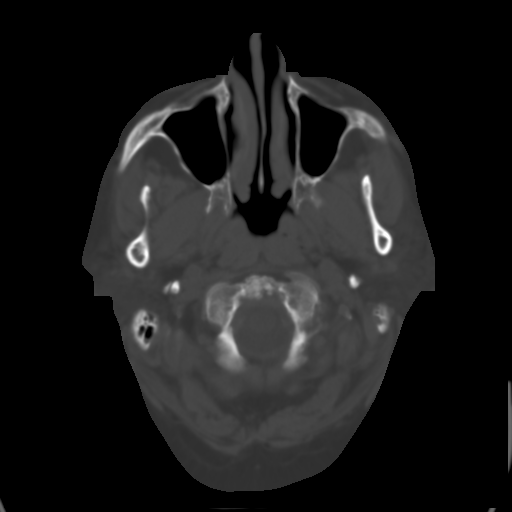
[im 5/31  brain]
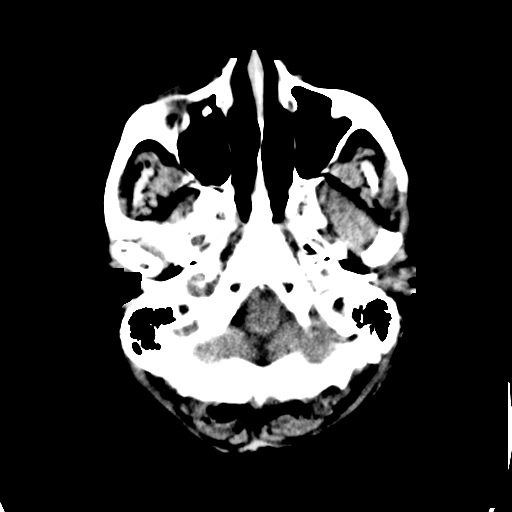
[im 7/31  brain]
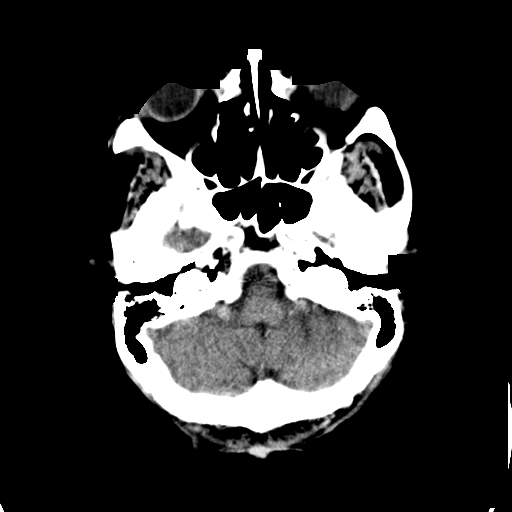
[im 9/31  brain]
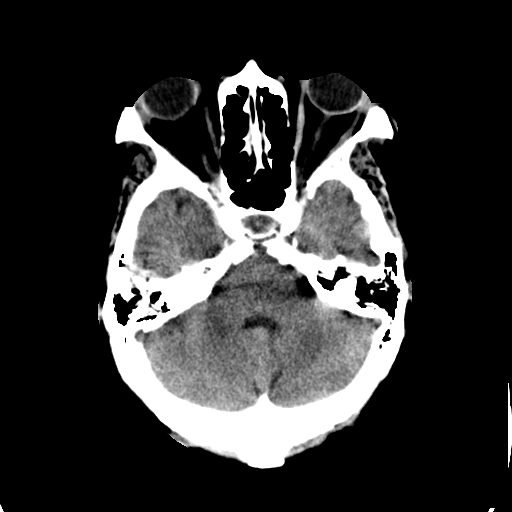
[im 11/31  brain]
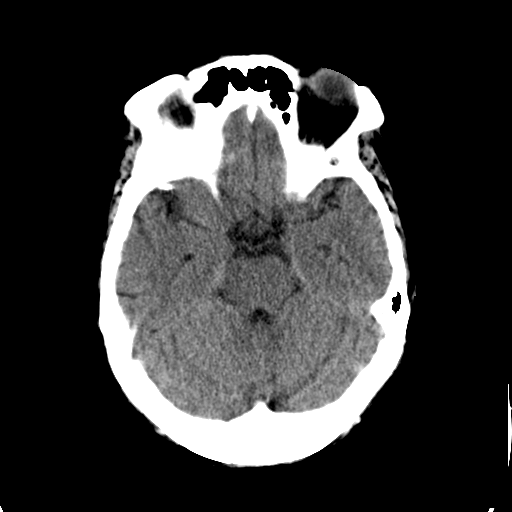
[im 11/31  bone]
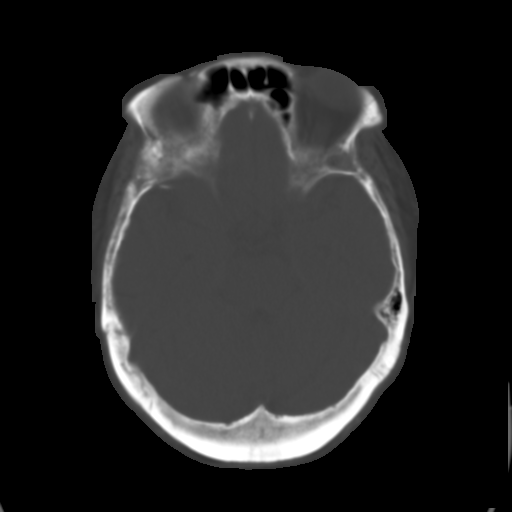
[im 13/31  brain]
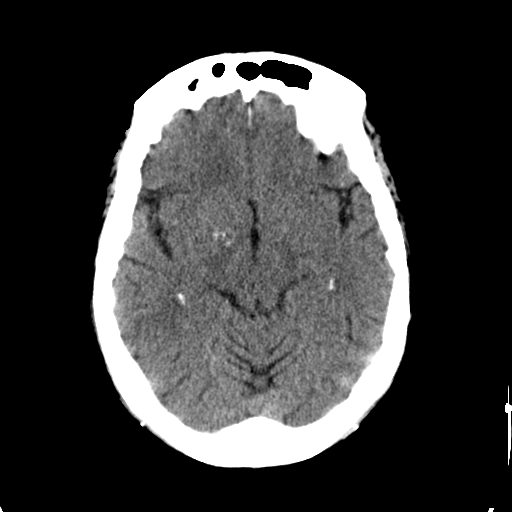
[im 16/31  brain]
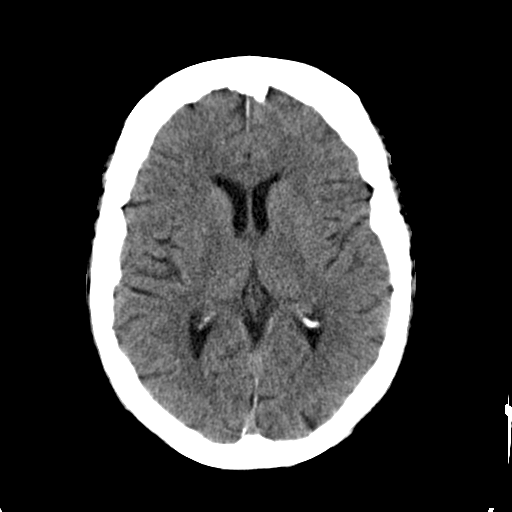
[im 18/31  brain]
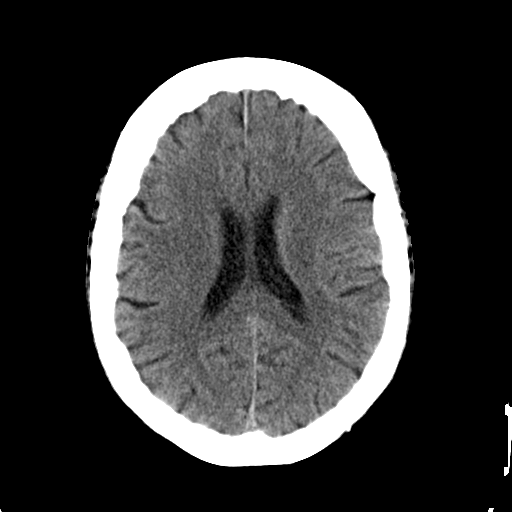
[im 20/31  brain]
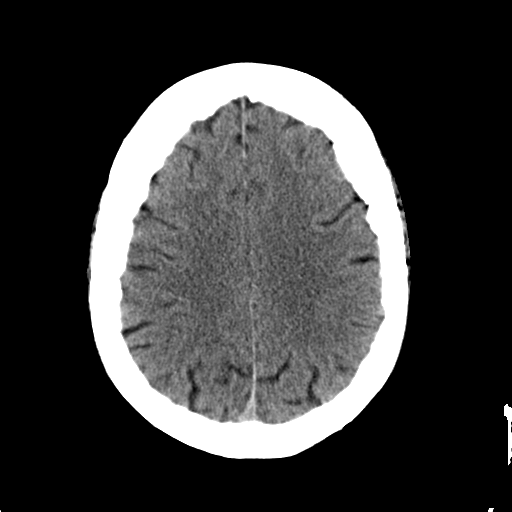
[im 20/31  bone]
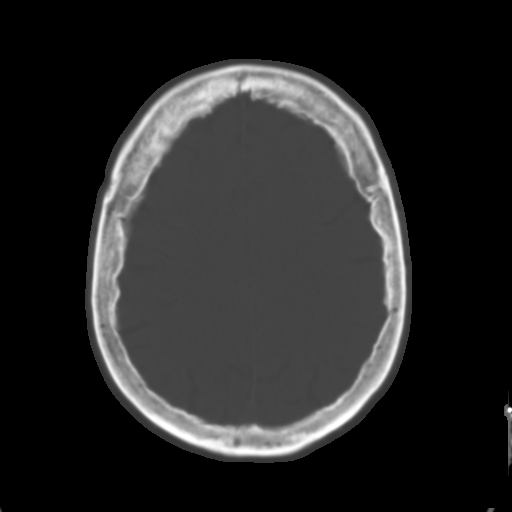
[im 22/31  brain]
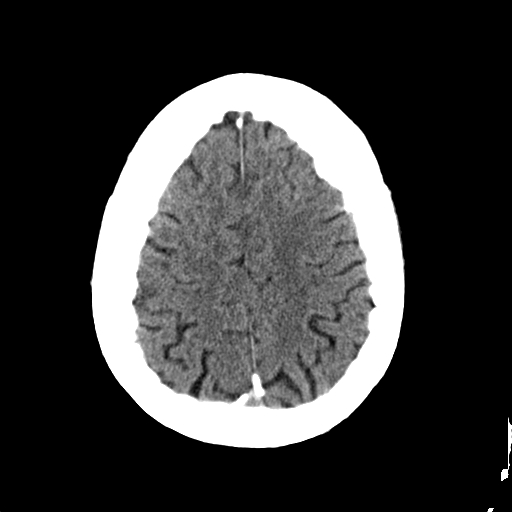
[im 24/31  brain]
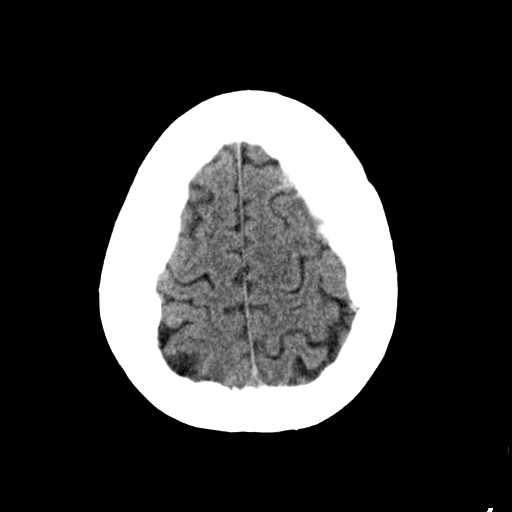
[im 26/31  brain]
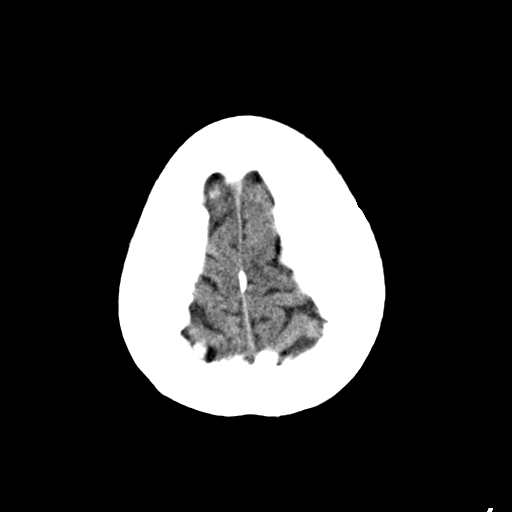
[im 28/31  brain]
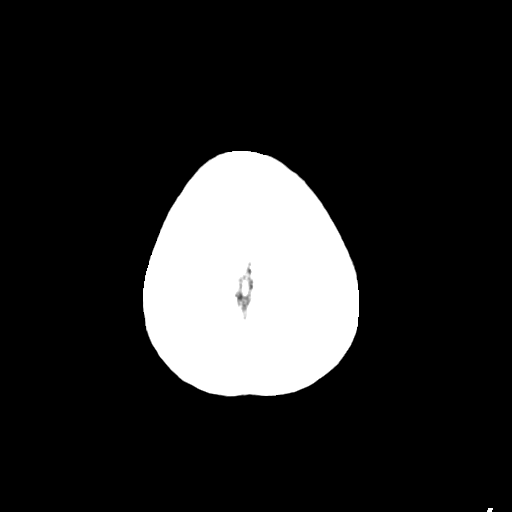
[im 28/31  bone]
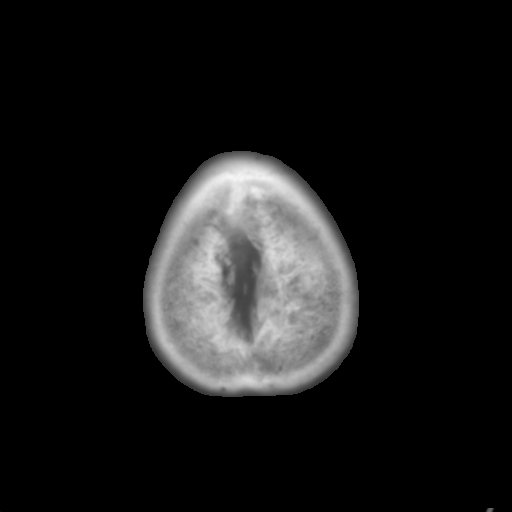

[Series 3: bone · axial · 0.43mm/px · z∈[-16,+24]mm · 3 of 31 slices shown]
[im 3/31  bone]
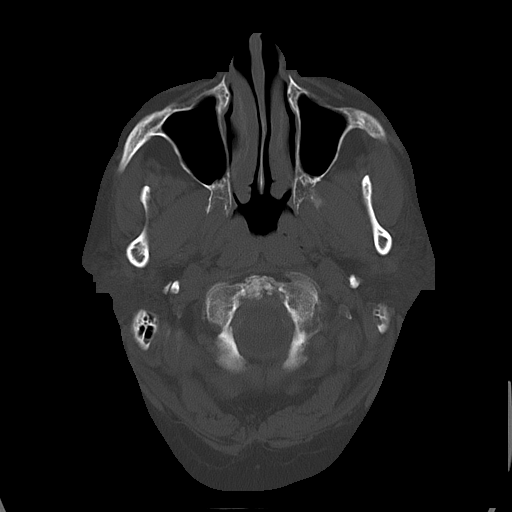
[im 7/31  bone]
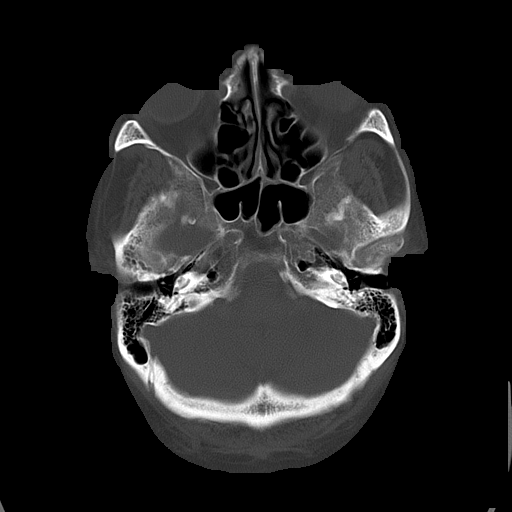
[im 11/31  bone]
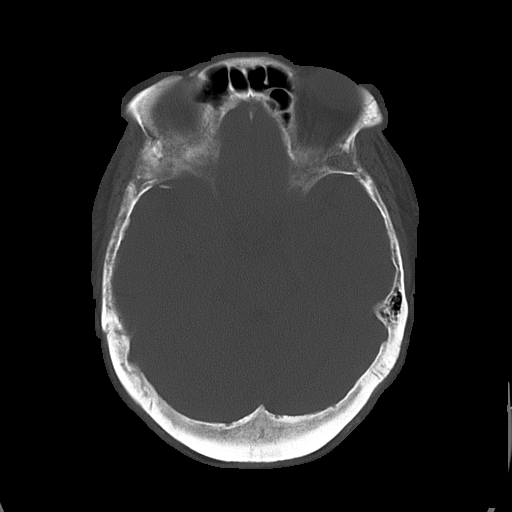

[16 of 30 positions shown; findings below may reference images not displayed]

FINDINGS: There is no evidence for mass effect, midline shift, or extra-axial fluid
collections. There is no evidence for space-occupying lesion, intracranial
hemorrhage, or cortical-based area of infarction. There are nonspecific
calcifications in the basal ganglia.

The osseous structures are unremarkable.
IMPRESSION: No acute intracranial process.

## 2013-09-17 IMAGING — CT CT ABD-PELV W/ CM
1 of 2 series · 15 of 32 positions shown, 19 images · non-contrast
Comparison: none

REASON FOR EXAM: (1) LLQ pain , borderline temp; (2) LLQ pain ,
borderline temp
COMMENTS:

[Series 2: 3mm soft tissue · axial · 0.82mm/px · z∈[-984,-492]mm · 15 of 180 slices shown, 19 images]
[im 8/180  soft-tissue]
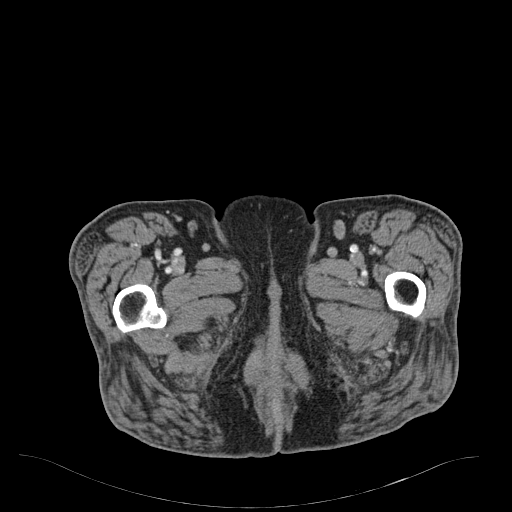
[im 8/180  bone]
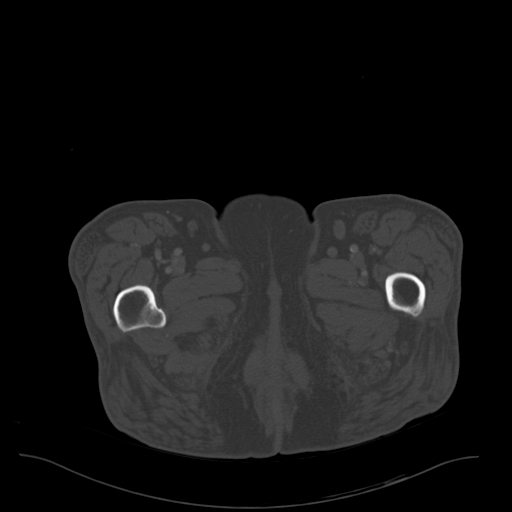
[im 22/180  soft-tissue]
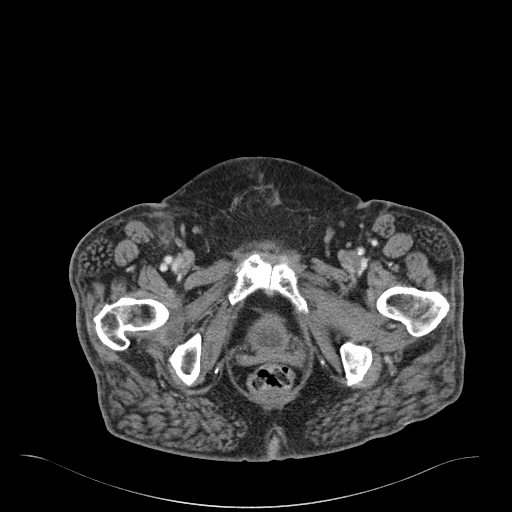
[im 36/180  soft-tissue]
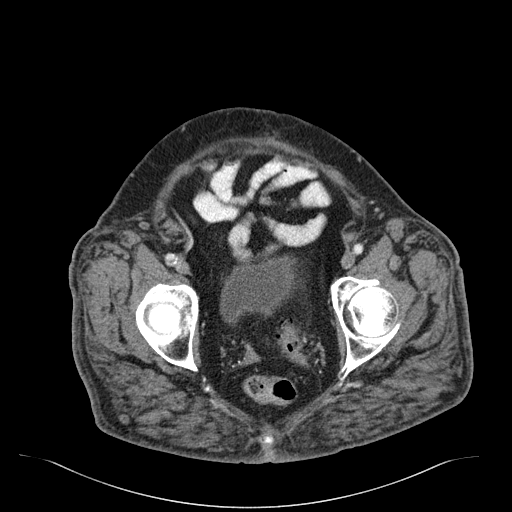
[im 51/180  soft-tissue]
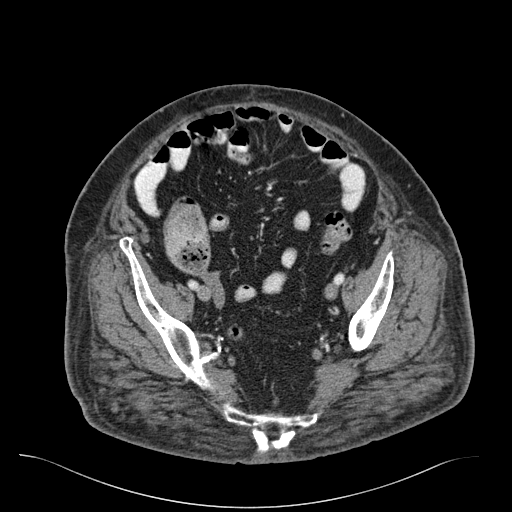
[im 65/180  soft-tissue]
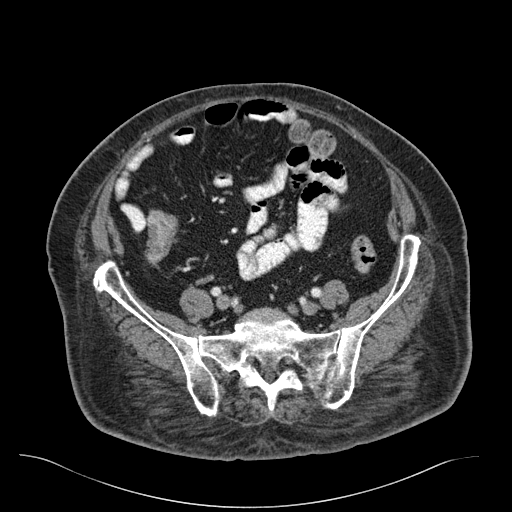
[im 79/180  soft-tissue]
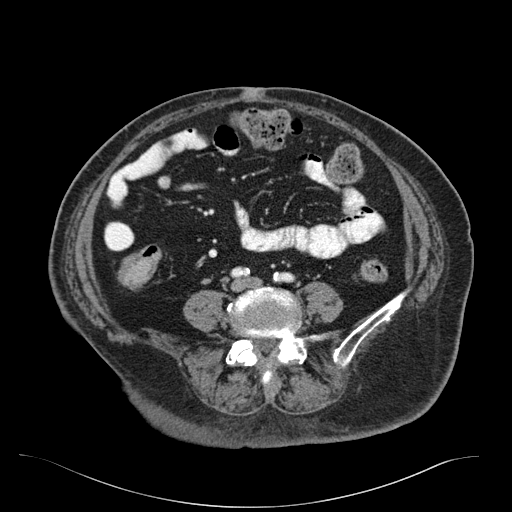
[im 94/180  soft-tissue]
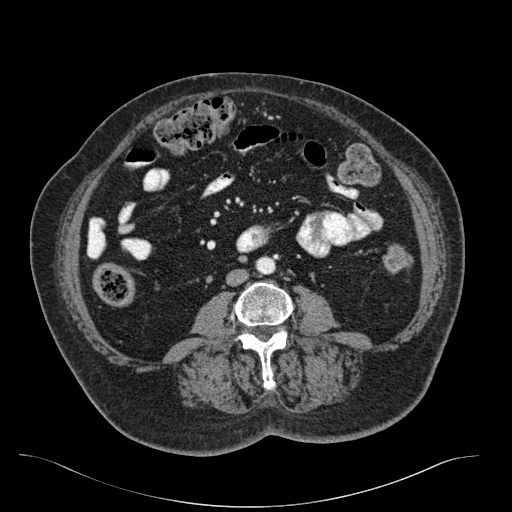
[im 101/180  soft-tissue]
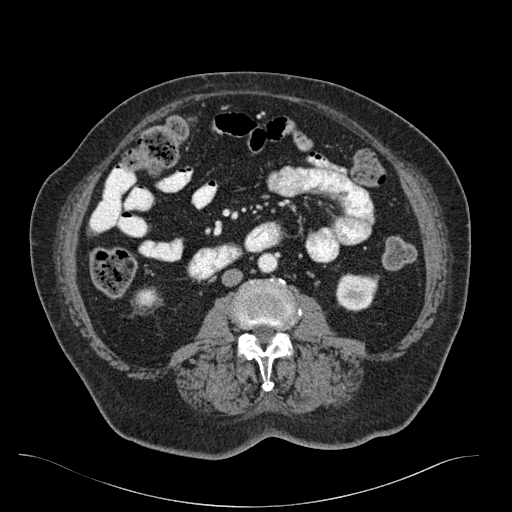
[im 115/180  soft-tissue]
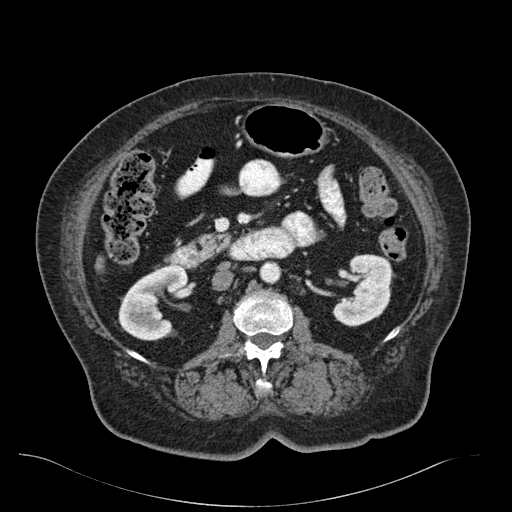
[im 115/180  bone]
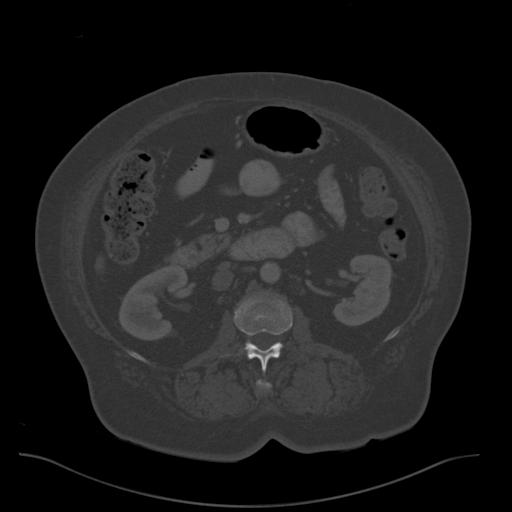
[im 129/180  soft-tissue]
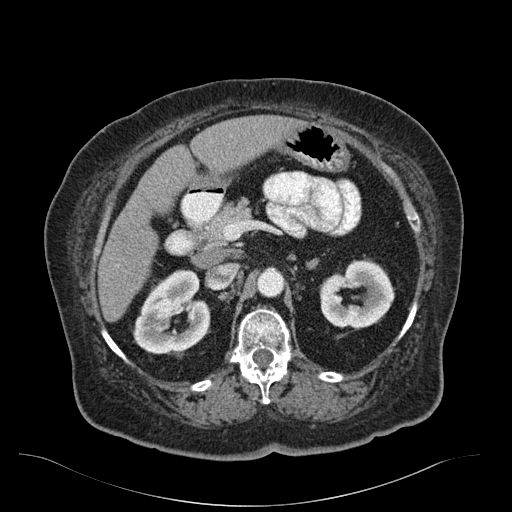
[im 144/180  soft-tissue]
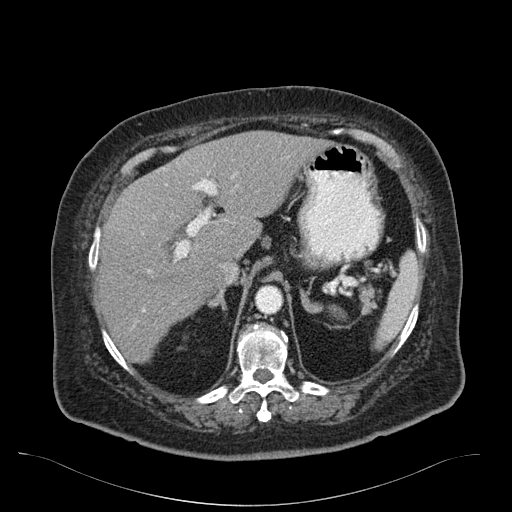
[im 151/180  lung]
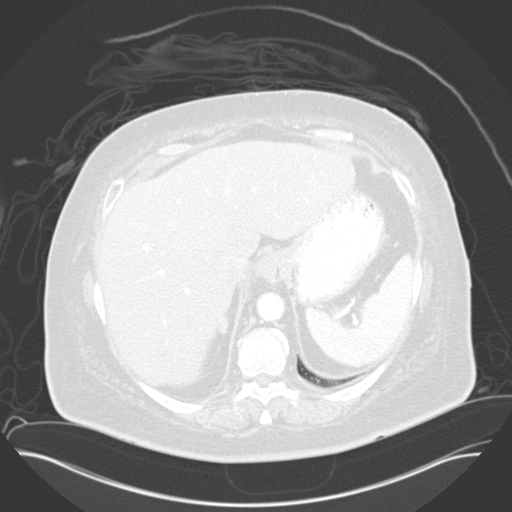
[im 158/180  soft-tissue]
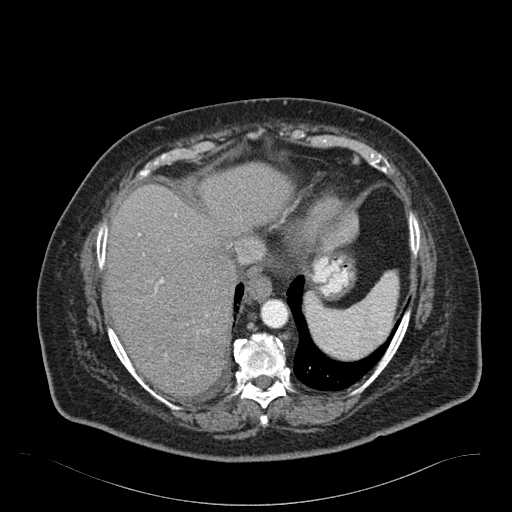
[im 158/180  lung]
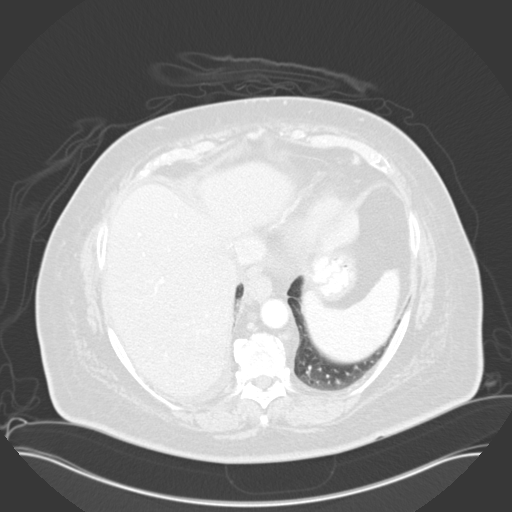
[im 165/180  lung]
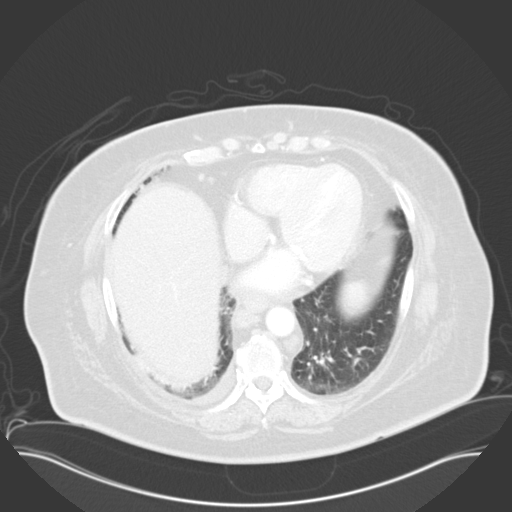
[im 172/180  soft-tissue]
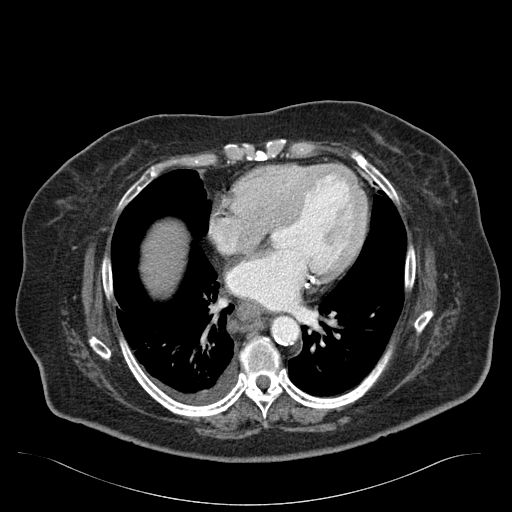
[im 172/180  lung]
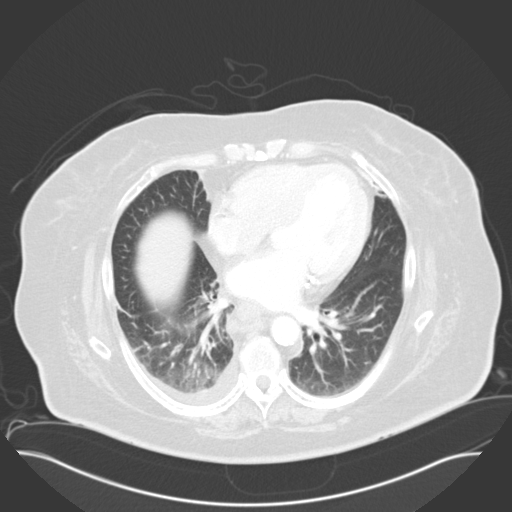

[15 of 32 positions shown; findings below may reference images not displayed]

PROCEDURE:     CT  - CT ABDOMEN / PELVIS  W  - May 26, 2011 [DATE]

RESULT:     Emergent CT of the abdomen and pelvis is performed with 100 mL
of 5sovue-OJH iodinated intravenous contrast and oral contrast. Images are
reconstructed in the axial plane at 3.0 mm slice thickness. There is no
previous examination for comparison.

There is a small right pleural effusion with some right lung base
atelectasis visualized. Lingular and left lower lobe atelectasis is also
present to a lesser extent. A discrete pulmonary parenchymal mass is not
apparent. There are multiple prominent lymph nodes adjacent to the distal
esophagus. These are of uncertain etiology and further investigation with CT
of the chest may be beneficial. No retroperitoneal or mesenteric adenopathy
is evident. There is no pelvic mass or evidence of an acute pelvic
inflammatory process. Normal-appearing inguinal lymph nodes are present
bilaterally. No abnormal bowel distention or bowel wall thickening is
present. The aorta is normal in caliber. The kidneys show no obstruction or
discrete mass. There is hepatic steatosis. The spleen, pancreas, adrenal
glands and bony structures appear within normal limits. It appears the
gallbladder has been removed. The right colon is not fully distended
especially in the more superior portion of the ascending colon. Wall
thickening in this region cannot be excluded. The appendix appears normal.
IMPRESSION: 1. Prominent lymph nodes surrounding the distal esophagus. Further
investigation with CT of the chest may be beneficial. There is a small right
pleural effusion with bibasilar atelectasis.
2. Incomplete distention of the right colon. The possibility of a focal
peristaltic wave, wall thickening or mass cannot be completely excluded
especially in the region of image #91 to image #109. If the patient has not
had a recent colonoscopy, further investigation may be beneficial.

## 2013-09-27 ENCOUNTER — Telehealth: Payer: Self-pay | Admitting: Internal Medicine

## 2013-09-27 MED ORDER — TRUERESULT BLOOD GLUCOSE W/DEVICE KIT: 1.0000 | PACK | Freq: Three times a day (TID) | Status: DC

## 2013-09-27 NOTE — Telephone Encounter (Signed)
Script sent. Left message for patient to call office,

## 2013-09-27 NOTE — Telephone Encounter (Signed)
Pt stated her machine to check her sugar broke  She wanted to know if she needed a rx to get another one.  Or do we have any here she can get.  [facility] [address] [place]

## 2013-09-27 NOTE — Telephone Encounter (Signed)
i ask becky she stated she would need rx.  Pt stated she used  [facility].  She wasn't for sure exact name Please let pt know when this has been called in

## 2013-10-07 DIAGNOSIS — E1142 Type 2 diabetes mellitus with diabetic polyneuropathy: Secondary | ICD-10-CM | POA: Diagnosis not present

## 2013-10-07 DIAGNOSIS — L97509 Non-pressure chronic ulcer of other part of unspecified foot with unspecified severity: Secondary | ICD-10-CM | POA: Diagnosis not present

## 2013-10-07 DIAGNOSIS — E1149 Type 2 diabetes mellitus with other diabetic neurological complication: Secondary | ICD-10-CM | POA: Diagnosis not present

## 2013-10-10 ENCOUNTER — Ambulatory Visit: Payer: Medicare Other | Admitting: Internal Medicine

## 2013-10-10 ENCOUNTER — Encounter: Payer: Self-pay | Admitting: Neurology

## 2013-10-10 DIAGNOSIS — IMO0001 Reserved for inherently not codable concepts without codable children: Secondary | ICD-10-CM | POA: Diagnosis not present

## 2013-10-10 DIAGNOSIS — M6281 Muscle weakness (generalized): Secondary | ICD-10-CM | POA: Diagnosis not present

## 2013-10-10 DIAGNOSIS — R262 Difficulty in walking, not elsewhere classified: Secondary | ICD-10-CM | POA: Diagnosis not present

## 2013-10-12 ENCOUNTER — Telehealth: Payer: Self-pay | Admitting: Internal Medicine

## 2013-10-12 NOTE — Telephone Encounter (Signed)
Patient dropped off handicap paper work that she needs filled out. Paper are in Dr. Lupita Dawn box. Please call pt when paper work is ready/msn

## 2013-10-13 NOTE — Telephone Encounter (Signed)
In red folder. 

## 2013-10-14 NOTE — Telephone Encounter (Signed)
Patient notified placard ready for pick up placed at front desk.

## 2013-10-18 ENCOUNTER — Telehealth: Payer: Self-pay | Admitting: Pulmonary Disease

## 2013-10-18 NOTE — Telephone Encounter (Signed)
Spoke with pt. She reports she gave a paper to [place] regarding her CPAP. She was told she would give this to [facility] to sign and would fax back. She is checking on the status of this.  Looked in [facility] cubby and did not see anything. Caryl Pina do you recall this form? thanks

## 2013-10-19 ENCOUNTER — Other Ambulatory Visit: Payer: Self-pay | Admitting: Internal Medicine

## 2013-10-19 DIAGNOSIS — I2510 Atherosclerotic heart disease of native coronary artery without angina pectoris: Secondary | ICD-10-CM | POA: Diagnosis not present

## 2013-10-19 DIAGNOSIS — E119 Type 2 diabetes mellitus without complications: Secondary | ICD-10-CM | POA: Diagnosis not present

## 2013-10-19 DIAGNOSIS — I10 Essential (primary) hypertension: Secondary | ICD-10-CM | POA: Diagnosis not present

## 2013-10-19 DIAGNOSIS — R259 Unspecified abnormal involuntary movements: Secondary | ICD-10-CM | POA: Diagnosis not present

## 2013-10-19 NOTE — Telephone Encounter (Signed)
I do not have any forms on this pt.  She also hasn't been seen since 02/22/13 and is due for a rov with no appt scheduled. Next available date in [place] with available dates is 8/25.

## 2013-10-22 ENCOUNTER — Other Ambulatory Visit: Payer: Self-pay | Admitting: Internal Medicine

## 2013-11-04 ENCOUNTER — Ambulatory Visit: Payer: Medicare Other | Admitting: Internal Medicine

## 2013-11-09 ENCOUNTER — Encounter: Payer: Self-pay | Admitting: Neurology

## 2013-11-14 ENCOUNTER — Telehealth: Payer: Self-pay | Admitting: Internal Medicine

## 2013-11-14 ENCOUNTER — Ambulatory Visit: Payer: Medicare Other | Admitting: Internal Medicine

## 2013-11-14 ENCOUNTER — Inpatient Hospital Stay: Payer: Self-pay | Admitting: Internal Medicine

## 2013-11-14 DIAGNOSIS — E1149 Type 2 diabetes mellitus with other diabetic neurological complication: Secondary | ICD-10-CM | POA: Diagnosis present

## 2013-11-14 DIAGNOSIS — E785 Hyperlipidemia, unspecified: Secondary | ICD-10-CM | POA: Diagnosis present

## 2013-11-14 DIAGNOSIS — I440 Atrioventricular block, first degree: Secondary | ICD-10-CM | POA: Diagnosis present

## 2013-11-14 DIAGNOSIS — J9600 Acute respiratory failure, unspecified whether with hypoxia or hypercapnia: Secondary | ICD-10-CM | POA: Diagnosis present

## 2013-11-14 DIAGNOSIS — M79609 Pain in unspecified limb: Secondary | ICD-10-CM | POA: Diagnosis not present

## 2013-11-14 DIAGNOSIS — G4733 Obstructive sleep apnea (adult) (pediatric): Secondary | ICD-10-CM | POA: Diagnosis present

## 2013-11-14 DIAGNOSIS — E1142 Type 2 diabetes mellitus with diabetic polyneuropathy: Secondary | ICD-10-CM | POA: Diagnosis present

## 2013-11-14 DIAGNOSIS — I509 Heart failure, unspecified: Secondary | ICD-10-CM | POA: Diagnosis present

## 2013-11-14 DIAGNOSIS — K5900 Constipation, unspecified: Secondary | ICD-10-CM | POA: Diagnosis present

## 2013-11-14 DIAGNOSIS — R51 Headache: Secondary | ICD-10-CM | POA: Diagnosis not present

## 2013-11-14 DIAGNOSIS — R0789 Other chest pain: Secondary | ICD-10-CM | POA: Diagnosis not present

## 2013-11-14 DIAGNOSIS — Z882 Allergy status to sulfonamides status: Secondary | ICD-10-CM | POA: Diagnosis not present

## 2013-11-14 DIAGNOSIS — J189 Pneumonia, unspecified organism: Secondary | ICD-10-CM | POA: Diagnosis present

## 2013-11-14 DIAGNOSIS — I739 Peripheral vascular disease, unspecified: Secondary | ICD-10-CM | POA: Diagnosis not present

## 2013-11-14 DIAGNOSIS — R1032 Left lower quadrant pain: Secondary | ICD-10-CM | POA: Diagnosis not present

## 2013-11-14 DIAGNOSIS — I5033 Acute on chronic diastolic (congestive) heart failure: Secondary | ICD-10-CM | POA: Diagnosis not present

## 2013-11-14 DIAGNOSIS — R0602 Shortness of breath: Secondary | ICD-10-CM | POA: Diagnosis not present

## 2013-11-14 DIAGNOSIS — I10 Essential (primary) hypertension: Secondary | ICD-10-CM | POA: Diagnosis not present

## 2013-11-14 DIAGNOSIS — J90 Pleural effusion, not elsewhere classified: Secondary | ICD-10-CM | POA: Diagnosis not present

## 2013-11-14 DIAGNOSIS — R0902 Hypoxemia: Secondary | ICD-10-CM | POA: Diagnosis not present

## 2013-11-14 LAB — CK TOTAL AND CKMB (NOT AT ARMC)
CK, TOTAL: 181 U/L
CK, TOTAL: 170 U/L
CK, Total: 213 U/L — ABNORMAL HIGH
CK-MB: 3 ng/mL (ref 0.5–3.6)
CK-MB: 2.5 ng/mL (ref 0.5–3.6)
CK-MB: 2.4 ng/mL (ref 0.5–3.6)

## 2013-11-14 LAB — BASIC METABOLIC PANEL
ANION GAP: 7 (ref 7–16)
BUN: 15 mg/dL (ref 7–18)
CHLORIDE: 108 mmol/L — AB (ref 98–107)
CO2: 25 mmol/L (ref 21–32)
CREATININE: 1.17 mg/dL (ref 0.60–1.30)
Calcium, Total: 9.1 mg/dL (ref 8.5–10.1)
EGFR (African American): 54 — ABNORMAL LOW
GFR CALC NON AF AMER: 46 — AB
Glucose: 191 mg/dL — ABNORMAL HIGH (ref 65–99)
Osmolality: 285 (ref 275–301)
Potassium: 3.7 mmol/L (ref 3.5–5.1)
Sodium: 140 mmol/L (ref 136–145)

## 2013-11-14 LAB — CBC
HCT: 38.6 % (ref 35.0–47.0)
HGB: 12.6 g/dL (ref 12.0–16.0)
MCH: 29.7 pg (ref 26.0–34.0)
MCHC: 32.8 g/dL (ref 32.0–36.0)
MCV: 91 fL (ref 80–100)
Platelet: 244 10*3/uL (ref 150–440)
RBC: 4.26 10*6/uL (ref 3.80–5.20)
RDW: 14.6 % — ABNORMAL HIGH (ref 11.5–14.5)
WBC: 5.5 10*3/uL (ref 3.6–11.0)

## 2013-11-14 LAB — TROPONIN I

## 2013-11-14 NOTE — Telephone Encounter (Signed)
FYI

## 2013-11-14 NOTE — Telephone Encounter (Signed)
Patient Information:  Caller Name: Brend[place]  Phone: [phone]  Patient: Pamela Collier, Pamela Collier  Gender: Female  DOB: 1941/05/12  Age: 73 Years  PCP: Deborra Medina (Adults only)  Office Follow Up:  Does the office need to follow up with this patient?: Yes  Instructions For The Office: FYI going to ER for SOB  RN Note:  Afebrile. New onset of Moderate SOB started 11/10/2013 after returning from [place], drove by car 8 hours. Her ankles were "swollen", took Lasix pills and "took swelling right down". She has taken a nebulizer treatment that she states has helped. RN/CAN called office and spoke to [facility] who transferred to an answering machine. RN/CAN called back and Melissa transferrd to [facility] who states ok to go to the ER. She will go to the [facility] ER.  Symptoms  Reason For Call & Symptoms: SOB  Reviewed Health History In EMR: Yes  Reviewed Medications In EMR: Yes  Reviewed Allergies In EMR: Yes  Reviewed Surgeries / Procedures: Yes  Date of Onset of Symptoms: 11/10/2013  Treatments Tried: nebulizer treatment  Treatments Tried Worked: No  Guideline(s) Used:  Breathing Difficulty  Disposition Per Guideline:   Go to ED Now  Reason For Disposition Reached:   Moderate difficulty breathing (e.g., speaks in phrases, SOB even at rest, pulse 100-120) of new onset or worse than normal  Advice Given:  N/A  Patient Will Follow Care Advice:  YES

## 2013-11-15 LAB — LACTATE DEHYDROGENASE, PLEURAL OR PERITONEAL FLUID: LDH, Body Fluid: 96 U/L

## 2013-11-15 LAB — CBC WITH DIFFERENTIAL/PLATELET
BASOS ABS: 0 10*3/uL (ref 0.0–0.1)
BASOS PCT: 0.8 %
Eosinophil #: 0.1 10*3/uL (ref 0.0–0.7)
Eosinophil %: 2.7 %
HCT: 35 % (ref 35.0–47.0)
HGB: 12 g/dL (ref 12.0–16.0)
Lymphocyte #: 1.3 10*3/uL (ref 1.0–3.6)
Lymphocyte %: 24.7 %
MCH: 30.7 pg (ref 26.0–34.0)
MCHC: 34.3 g/dL (ref 32.0–36.0)
MCV: 90 fL (ref 80–100)
Monocyte #: 0.6 x10 3/mm (ref 0.2–0.9)
Monocyte %: 11 %
Neutrophil #: 3.2 10*3/uL (ref 1.4–6.5)
Neutrophil %: 60.8 %
Platelet: 241 10*3/uL (ref 150–440)
RBC: 3.91 10*6/uL (ref 3.80–5.20)
RDW: 14.7 % — ABNORMAL HIGH (ref 11.5–14.5)
WBC: 5.3 10*3/uL (ref 3.6–11.0)

## 2013-11-15 LAB — COMPREHENSIVE METABOLIC PANEL
Albumin: 2.8 g/dL — ABNORMAL LOW (ref 3.4–5.0)
Alkaline Phosphatase: 61 U/L
Anion Gap: 6 — ABNORMAL LOW (ref 7–16)
BUN: 19 mg/dL — ABNORMAL HIGH (ref 7–18)
Bilirubin,Total: 0.5 mg/dL (ref 0.2–1.0)
CHLORIDE: 105 mmol/L (ref 98–107)
CO2: 28 mmol/L (ref 21–32)
Calcium, Total: 8.8 mg/dL (ref 8.5–10.1)
Creatinine: 1.43 mg/dL — ABNORMAL HIGH (ref 0.60–1.30)
EGFR (African American): 42 — ABNORMAL LOW
EGFR (Non-African Amer.): 36 — ABNORMAL LOW
GLUCOSE: 172 mg/dL — AB (ref 65–99)
Osmolality: 284 (ref 275–301)
Potassium: 3.7 mmol/L (ref 3.5–5.1)
SGOT(AST): 22 U/L (ref 15–37)
SGPT (ALT): 22 U/L (ref 12–78)
Sodium: 139 mmol/L (ref 136–145)
Total Protein: 6.9 g/dL (ref 6.4–8.2)

## 2013-11-15 LAB — BODY FLUID CELL COUNT WITH DIFFERENTIAL
Basophil: 0 %
Eosinophil: 0 %
Lymphocytes: 30 %
NEUTROS PCT: 3 %
Nucleated Cell Count: 1471 /mm3
Other Cells BF: 60 %
Other Mononuclear Cells: 7 %

## 2013-11-15 LAB — LACTATE DEHYDROGENASE: LDH: 167 U/L (ref 81–246)

## 2013-11-16 LAB — BASIC METABOLIC PANEL
Anion Gap: 7 (ref 7–16)
BUN: 28 mg/dL — AB (ref 7–18)
CO2: 27 mmol/L (ref 21–32)
CREATININE: 1.5 mg/dL — AB (ref 0.60–1.30)
Calcium, Total: 9.1 mg/dL (ref 8.5–10.1)
Chloride: 104 mmol/L (ref 98–107)
EGFR (African American): 40 — ABNORMAL LOW
EGFR (Non-African Amer.): 34 — ABNORMAL LOW
Glucose: 135 mg/dL — ABNORMAL HIGH (ref 65–99)
Osmolality: 283 (ref 275–301)
POTASSIUM: 4.1 mmol/L (ref 3.5–5.1)
Sodium: 138 mmol/L (ref 136–145)

## 2013-11-17 ENCOUNTER — Telehealth: Payer: Self-pay | Admitting: Internal Medicine

## 2013-11-17 LAB — BASIC METABOLIC PANEL
ANION GAP: 6 — AB (ref 7–16)
BUN: 28 mg/dL — ABNORMAL HIGH (ref 7–18)
CHLORIDE: 106 mmol/L (ref 98–107)
Calcium, Total: 9.4 mg/dL (ref 8.5–10.1)
Co2: 26 mmol/L (ref 21–32)
Creatinine: 1.57 mg/dL — ABNORMAL HIGH (ref 0.60–1.30)
GFR CALC AF AMER: 38 — AB
GFR CALC NON AF AMER: 32 — AB
GLUCOSE: 155 mg/dL — AB (ref 65–99)
Osmolality: 284 (ref 275–301)
Potassium: 4.5 mmol/L (ref 3.5–5.1)
SODIUM: 138 mmol/L (ref 136–145)

## 2013-11-17 NOTE — Telephone Encounter (Signed)
The patient is needing a hospital follow up from acute respiratory failure.

## 2013-11-17 NOTE — Telephone Encounter (Signed)
Please schedule

## 2013-11-17 NOTE — Telephone Encounter (Signed)
I'll see her at 1:00 PM on Monday

## 2013-11-17 NOTE — Telephone Encounter (Signed)
At time of call, pt still in hospital.  Should be released today around 2:00 p.m.  States pt should be able to come Monday and to put on schedule.  They will call if pt cannot come.  Also asks for call back to them to confirm the appt.  Scheduled.

## 2013-11-17 NOTE — Telephone Encounter (Signed)
No spot but acute available until 12/11/13 please advise.

## 2013-11-19 ENCOUNTER — Other Ambulatory Visit: Payer: Self-pay | Admitting: Internal Medicine

## 2013-11-19 LAB — BODY FLUID CULTURE

## 2013-11-21 ENCOUNTER — Encounter: Payer: Self-pay | Admitting: Internal Medicine

## 2013-11-21 ENCOUNTER — Ambulatory Visit (INDEPENDENT_AMBULATORY_CARE_PROVIDER_SITE_OTHER): Payer: Medicare Other | Admitting: Internal Medicine

## 2013-11-21 VITALS — BP 118/50 | HR 64 | Temp 98.5°F | Resp 18 | Ht 66.75 in | Wt 187.5 lb

## 2013-11-21 DIAGNOSIS — G4733 Obstructive sleep apnea (adult) (pediatric): Secondary | ICD-10-CM | POA: Diagnosis not present

## 2013-11-21 DIAGNOSIS — E1142 Type 2 diabetes mellitus with diabetic polyneuropathy: Secondary | ICD-10-CM

## 2013-11-21 DIAGNOSIS — R51 Headache: Secondary | ICD-10-CM

## 2013-11-21 DIAGNOSIS — N9989 Other postprocedural complications and disorders of genitourinary system: Secondary | ICD-10-CM

## 2013-11-21 DIAGNOSIS — IMO0002 Reserved for concepts with insufficient information to code with codable children: Secondary | ICD-10-CM

## 2013-11-21 DIAGNOSIS — R519 Headache, unspecified: Secondary | ICD-10-CM

## 2013-11-21 DIAGNOSIS — N179 Acute kidney failure, unspecified: Secondary | ICD-10-CM

## 2013-11-21 DIAGNOSIS — E1165 Type 2 diabetes mellitus with hyperglycemia: Principal | ICD-10-CM

## 2013-11-21 DIAGNOSIS — E1140 Type 2 diabetes mellitus with diabetic neuropathy, unspecified: Secondary | ICD-10-CM

## 2013-11-21 DIAGNOSIS — N990 Postprocedural (acute) (chronic) kidney failure: Secondary | ICD-10-CM

## 2013-11-21 DIAGNOSIS — F418 Other specified anxiety disorders: Secondary | ICD-10-CM

## 2013-11-21 DIAGNOSIS — J90 Pleural effusion, not elsewhere classified: Secondary | ICD-10-CM | POA: Diagnosis not present

## 2013-11-21 DIAGNOSIS — E1129 Type 2 diabetes mellitus with other diabetic kidney complication: Secondary | ICD-10-CM

## 2013-11-21 DIAGNOSIS — F341 Dysthymic disorder: Secondary | ICD-10-CM

## 2013-11-21 DIAGNOSIS — I2510 Atherosclerotic heart disease of native coronary artery without angina pectoris: Secondary | ICD-10-CM

## 2013-11-21 DIAGNOSIS — E1149 Type 2 diabetes mellitus with other diabetic neurological complication: Secondary | ICD-10-CM

## 2013-11-21 LAB — LIPID PANEL
CHOL/HDL RATIO: 4
Cholesterol: 156 mg/dL (ref 0–200)
HDL: 35.7 mg/dL — AB (ref >39.00)
LDL Cholesterol: 81 mg/dL (ref 0–99)
NONHDL: 120.3
Triglycerides: 195 mg/dL — ABNORMAL HIGH (ref 0.0–149.0)
VLDL: 39 mg/dL (ref 0.0–40.0)

## 2013-11-21 LAB — COMPREHENSIVE METABOLIC PANEL
ALBUMIN: 3.4 g/dL — AB (ref 3.5–5.2)
ALT: 20 U/L (ref 0–35)
AST: 17 U/L (ref 0–37)
Alkaline Phosphatase: 50 U/L (ref 39–117)
BUN: 31 mg/dL — ABNORMAL HIGH (ref 6–23)
CHLORIDE: 105 meq/L (ref 96–112)
CO2: 29 mEq/L (ref 19–32)
Calcium: 9.2 mg/dL (ref 8.4–10.5)
Creatinine, Ser: 1.5 mg/dL — ABNORMAL HIGH (ref 0.4–1.2)
GFR: 44.74 mL/min — ABNORMAL LOW (ref >60.00)
Glucose, Bld: 161 mg/dL — ABNORMAL HIGH (ref 70–99)
POTASSIUM: 4.7 meq/L (ref 3.5–5.1)
Sodium: 139 mEq/L (ref 135–145)
TOTAL PROTEIN: 6.9 g/dL (ref 6.0–8.3)
Total Bilirubin: 0.7 mg/dL (ref 0.2–1.2)

## 2013-11-21 LAB — HM DIABETES FOOT EXAM: HM DIABETIC FOOT EXAM: ABNORMAL

## 2013-11-21 LAB — HEMOGLOBIN A1C: Hgb A1c MFr Bld: 9.3 % — ABNORMAL HIGH (ref 4.6–6.5)

## 2013-11-21 NOTE — Patient Instructions (Addendum)
I am referring you to [facility]  Or whichever one is covered by your insurance  You need to understand more about how certain foods should be avoided due to your diagnosis of diabetes  Increase your evening insulin to 45 units and continue 38 units in the morning  You are due for your diabetic eye exam. Please let us know who we can schedule you with

## 2013-11-21 NOTE — Progress Notes (Signed)
Patient ID: Pamela Collier, female   DOB: May 20, 1940, 73 y.o.   MRN: [id number]  Patient Active Problem List   Diagnosis Date Noted  . Arthritis 08/05/2013  . Unspecified chronic bronchitis 07/01/2013  . Unsteady gait 03/24/2013  . History of colonic polyps 02/20/2013  . Need for prophylactic vaccination and inoculation against influenza 02/03/2013  . OSA (obstructive sleep apnea) 12/07/2012  . 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 Dysphagia 12/07/2012  . Pleural effusion 10/25/2012  . Cough 10/25/2012  . Hospital discharge follow-up 09/17/2012  . SOB (shortness of breath) 05/27/2012  . Pulmonary nodule seen on imaging study 05/20/2012  . Heart failure, diastolic, acute 93/81/8299  . 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 Cardiomyopathy, ischemic   . Hepatic steatosis   . Osteoporosis, post-menopausal   . 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 Atherosclerotic peripheral vascular disease with gangrene   . Type II or unspecified type diabetes mellitus with renal manifestations, uncontrolled 12/14/2011  . Double vessel coronary artery disease 12/14/2011    Subjective:  CC:   Chief Complaint  Patient presents with  . Follow-up    hospital follow up    HPI:   GAE BIHL is a 73 y.o. female who presents for   1) Hospital  follow up for acute respiratory failure . Patinet was admitted to [facility] on July 6 and discharged home July 9.  PE was ruled out with CT but she had an enlarging   pleural effusion and is now s/p diagnostic thoracentesis.  DC summary reports an exudative analysis presumed to be due to PNA so she was discharged home on Levaquin .  She is a lifelong nonsmoker.    2) DM:  She was given a different form of insulin while in house, but was unclear what ot do at discharge so she has resumed prior regimen of insulin  Using 37 units of mixed insulin 70/30 in am and 42 units in the PM.  Did  not bring her blood sugar log with her today but states that today's fasting was 160 and she reports that her sugars have been kind of  high" with frequent elevations to 200.  She is not taking prednisone.   Diet reviewed.  She is unclear about what to avoid other than cake and candy. She ate fried chicken yesterday and had " sugar free" waffles in the hospital.    3) Malaise:  Feels lousy today   Last dose of levaquin today . nO DIARRHEA OR NAUSEA . She has reported having a headache every morning for the past month.  She has a history of OSA and is wearing her CPAP every night with good tolerance.  her last study was in 2014    Past Medical History  Diagnosis Date  . Diabetes mellitus   . 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 Cardiomyopathy, ischemic Jan 2013    EF 35 to 40% [facility])  . Hepatic steatosis     by CT abd pelvis  . Osteoporosis, post-menopausal   . 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 Abdominal hysterectomy      at ge 39. secondary to bleeding  .  Toe amputation  Sept 2012    Right 2nd toe, [place]  . Ptca  August 2012    Right Posterior tibial artery , [place]       The following portions of the patient's history were reviewed and updated as appropriate: Allergies, current medications, and problem list.    Review of Systems:   Patient denies headache, fevers, malaise, unintentional weight loss, skin rash, eye pain, sinus congestion and sinus pain, sore throat, dysphagia,  hemoptysis , cough, dyspnea, wheezing, chest pain, palpitations, orthopnea, edema, abdominal pain, nausea, melena, diarrhea, constipation, flank pain, dysuria, hematuria, urinary  Frequency, nocturia, numbness, tingling, seizures,  Focal  weakness, Loss of consciousness,  Tremor, insomnia, depression, anxiety, and suicidal ideation.     History   Social History  . Marital Status: Married    Spouse Name: N/A    Number of Children: N/A  . Years of Education: N/A   Occupational History  . Not on file.   Social History Main Topics  . Smoking status: Never Smoker   . Smokeless tobacco: Never Used  . Alcohol Use: No  . Drug Use: No  . Sexual Activity: Yes   Other Topics Concern  . Not on file   Social History Narrative  . No narrative on file    Objective:  Filed Vitals:   11/21/13 1307  BP: 118/50  Pulse: 64  Temp: 98.5 F (36.9 C)  Resp: 18     General appearance: alert, cooperative and appears stated age Ears: normal TM's and external ear canals both ears Throat: lips, mucosa, and tongue normal; teeth and gums normal Neck: no adenopathy, no carotid bruit, supple, symmetrical, trachea midline and thyroid not enlarged, symmetric, no tenderness/mass/nodules Back: symmetric, no curvature. ROM normal. No CVA tenderness. Lungs: clear to auscultation bilaterally Heart: regular rate and rhythm, S1, S2 normal, no murmur, click, rub or gallop Abdomen: soft, non-tender; bowel sounds normal; no masses,  no organomegaly Pulses: 2+ and symmetric Skin: Skin color, texture, turgor normal. No rashes or lesions Lymph nodes: Cervical, supraclavicular, and axillary nodes normal.  Assessment and Plan:  Type II or unspecified type diabetes mellitus with renal manifestations, uncontrolled With loss to regular follow up due to chronic  Co morbidities.  Her sugars have been elevated on prior regimen.  I have addressed her current doses of insulin and increased evenign dose of mixed insulin  to 45 units.  Advised her to continue  38 unit in the AM .  Return with blood sugars.  Foot exam done today.  She has a history of neuropathy with evidence of a resolving pressure ulcer on the medial side of her great toe. 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  She has follow up with Dr Lake Bells tomorrow.   OSA (obstructive sleep apnea) By 2014 sleep study.   She is wearing her CPAP every night a minimum of 6 hours per night but continues to have morning headaches , which may be due to her  pleural effusion causing hypoxia in the supine position.  May need to have overnight pulse oximetry  Diabetic neuropathy, type II diabetes mellitus Foot exam done,  Podiatry follow up in place for resolving pressure ulcer and management of callous. Continue Lyrica  Depression with anxiety Her chief symptoms are malaise and anxiety surrounding her serious health issues. Continue prn alprazolam,  Will discuss SSRI at next visit once nocturnal hypoxia ruled out as source of malaise.      Updated Medication List Outpatient Encounter Prescriptions as of 11/21/2013  Medication Sig  . albuterol (PROVENTIL) (2.5 MG/3ML) 0.083% nebulizer solution Take 3 mLs (2.5 mg total) by nebulization every 6 (six) hours as needed for wheezing or shortness of breath.  . ALPRAZolam (XANAX) 0.5 MG tablet Take 1 tablet (0.5 mg total) by mouth daily as needed for sleep or anxiety.  [name] aspirin 81 MG EC tablet Take 1 tablet (81 mg total) by mouth daily. Swallow whole.  . B-D ULTRAFINE III SHORT PEN 31G X 8 MM MISC USE AS DIRECTED WITH NOVOLOG FLEXPEN  . Blood Glucose Monitoring Suppl (TRUERESULT BLOOD GLUCOSE) W/DEVICE KIT 1 kit by Does not apply route 3 (three) times daily.  . COMBIVENT RESPIMAT 20-100 MCG/ACT AERS respimat INHALE ONE PUFF EVERY 6 HOURS  . fluticasone (FLONASE) 50  MCG/ACT nasal spray Place 2 sprays into the nose daily.  . furosemide (LASIX) 20 MG tablet Take 20 mg by mouth daily.   . furosemide (LASIX) 20 MG tablet TAKE ONE TABLET BY MOUTH EVERY DAY  . glucose blood test strip Check blood sugar 3 times a day DX CODE: 250.0  . INS SYRINGE/NEEDLE 1CC/28G 28G X 1/2" 1 ML MISC 1 each by Does not apply route as needed. To test glucose levels daily.  . Insulin Aspart Prot & Aspart (NOVOLOG 70/30 MIX) (70-30) 100 UNIT/ML Pen 37 units before breakfast,  45 units before dinner.  NEEDS 10 PENS PER MONTH34 units before breakfast,  42 units before dinner  . isosorbide mononitrate (IMDUR) 30 MG 24 hr tablet Take 30 mg by mouth daily.  . Lancets 30G MISC Check blood glucose TID. Dx 250.00  . levofloxacin (LEVAQUIN) 750 MG tablet Take 750 mg by mouth every other day.  . metoprolol (LOPRESSOR) 50 MG tablet Take 0.5 tablets (25 mg total) by mouth 2 (two) times daily.  [name] olmesartan (BENICAR) 20 MG tablet Take 1 tablet (20 mg total) by mouth daily.  . pregabalin (LYRICA) 100 MG capsule Take 1 capsule (100 mg total) by mouth 3 (three) times daily.  . rosuvastatin (CRESTOR) 10 MG tablet Take 1 tablet (10 mg total) by mouth daily.  . TRUETEST TEST test strip CHECK BLOOD GLUCOSE THREE TIMES DAILY  . metoprolol tartrate (LOPRESSOR) 25 MG tablet TAKE ONE TABLET BY MOUTH TWICE DAILY  . Phenylephrine-DM-GG-APAP 5-10-200-325 MG TABS Per bottle directions as needed     Orders Placed This Encounter  Procedures  . Comprehensive metabolic panel  . Hemoglobin A1c  . Lipid panel  . Microalbumin / creatinine urine ratio  . Ambulatory referral to diabetic education  . Ambulatory referral to Sleep Studies  . HM DIABETES FOOT EXAM    Return in about 4 weeks (around 12/19/2013) for follow up diabetes.

## 2013-11-21 NOTE — Progress Notes (Signed)
Pre-visit discussion using our clinic review tool. No additional management support is needed unless otherwise documented below in the visit note.  

## 2013-11-21 NOTE — Assessment & Plan Note (Addendum)
With loss to regular follow up due to chronic  Co morbidities.  Her sugars have been elevated on prior regimen.  I have addressed her current doses of insulin and increased evenign dose of mixed insulin  to 45 units.  Advised her to continue  38 unit in the AM .  Return with blood sugars.  Foot exam done today.  She has a history of neuropathy with evidence of a resolving pressure ulcer on the medial side of her great toe. She has seen podiatry,  Continue ARB, statin, ASA and return for fasting labs ASAP  Lab Results  Component Value Date   HGBA1C 9.5* 07/01/2013   Lab Results  Component Value Date   MICROALBUR 159.5 Repeated and verified X2.* 07/01/2013   Lab Results  Component Value Date   CREATININE 0.9 07/01/2013

## 2013-11-22 ENCOUNTER — Telehealth: Payer: Self-pay | Admitting: Pulmonary Disease

## 2013-11-22 ENCOUNTER — Ambulatory Visit (INDEPENDENT_AMBULATORY_CARE_PROVIDER_SITE_OTHER): Payer: Medicare Other | Admitting: Pulmonary Disease

## 2013-11-22 ENCOUNTER — Encounter: Payer: Self-pay | Admitting: Pulmonary Disease

## 2013-11-22 VITALS — BP 128/64 | HR 62 | Ht 63.0 in | Wt 188.0 lb

## 2013-11-22 DIAGNOSIS — J90 Pleural effusion, not elsewhere classified: Secondary | ICD-10-CM | POA: Diagnosis not present

## 2013-11-22 DIAGNOSIS — J189 Pneumonia, unspecified organism: Secondary | ICD-10-CM

## 2013-11-22 DIAGNOSIS — I2510 Atherosclerotic heart disease of native coronary artery without angina pectoris: Secondary | ICD-10-CM | POA: Diagnosis not present

## 2013-11-22 DIAGNOSIS — D860 Sarcoidosis of lung: Secondary | ICD-10-CM

## 2013-11-22 DIAGNOSIS — D869 Sarcoidosis, unspecified: Secondary | ICD-10-CM | POA: Diagnosis not present

## 2013-11-22 DIAGNOSIS — J99 Respiratory disorders in diseases classified elsewhere: Secondary | ICD-10-CM

## 2013-11-22 LAB — MICROALBUMIN / CREATININE URINE RATIO
CREATININE, U: 266.4 mg/dL
Microalb Creat Ratio: 42.5 mg/g — ABNORMAL HIGH (ref 0.0–30.0)
Microalb, Ur: 113.1 mg/dL — ABNORMAL HIGH (ref 0.0–1.9)

## 2013-11-22 NOTE — Assessment & Plan Note (Addendum)
This is a difficult case. Considering her increasing dyspnea and wheezing it does make me wonder for sarcoid is not recurring somehow. She has been very intolerant of prednisone in the past and does not want to go back on prednisone. She was unable to cooperate with pulmonary function testing back in November for [place] to effectively use that as a data point for objectively assessing her disease.  Plan: -Start Advair for wheezing and shortness of breath -Plan to repeat chest CT in 3-4 months to evaluate lymphadenopathy. At this point that has been considered to be related to her old diagnosis of sarcoid and I still feel like that is the case. However if it progresses then we may need to pursue a biopsy of the lymphadenopathy considering her lipid rich pleural effusion (to ensure that we are not dealing with a malignancy).

## 2013-11-22 NOTE — Assessment & Plan Note (Signed)
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  She has follow up with Dr Lake Bells tomorrow.

## 2013-11-22 NOTE — Assessment & Plan Note (Addendum)
By 2014 sleep study.   She is wearing her CPAP every night a minimum of 6 hours per night but continues to have morning headaches , which may be due to her  pleural effusion causing hypoxia in the supine position.  May need to have overnight pulse oximetry

## 2013-11-22 NOTE — Assessment & Plan Note (Signed)
Her chief symptoms are malaise and anxiety surrounding her serious health issues. Continue prn alprazolam,  Will discuss SSRI at next visit once nocturnal hypoxia ruled out as source of malaise.

## 2013-11-22 NOTE — Progress Notes (Signed)
Subjective:    Patient ID: Pamela Collier, female    DOB: [birth year], 73 y.o.   MRN: [id number]  Synopsis: This is a 73 year old female first seen by Dr. Melvyn Novas in the [facility] pulmonary office in January 2014 for pulmonary nodule and some shortness of breath. An ACE inhibitor was held during that visit and followup imaging was planned.  HPI  11/22/2013 ROV > Chaselyn was recently hospitalized last week for shortness of breath.  She had a right pleural effusion removed by thoracentesis.  The thoracentesis didn't make her breathe any better.  She was told she may have pneumonia and she was treated with antibiotics.  She thinks that maybe she is still sick but she is better from pneumonia.  She had a cough in the hospital with phlegm but this is better now.  She did not have a fever with this illness that she is aware of.  She was wheezing and had some back discomfort before she went to the hospital which is now better. Her weight has been stable, she now weighs a bit more since I last saw her.   She had been traveling to [place] prior to her hospital visit.  Dr. Derrel Nip is adjusting her diuretic medicines.   She has been having pain in her back and stomach which have bothered her some.   She has used albuterol through a nebulizer which helps with wheezing.    Past Medical History  Diagnosis Date  . Diabetes mellitus   . 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 Cardiomyopathy, ischemic Jan 2013    EF 35 to 40% [facility])  . Hepatic steatosis     by CT abd pelvis  . Osteoporosis, post-menopausal   . 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  HENT: Negative for congestion, postnasal drip and rhinorrhea.   Respiratory: Positive  for cough and shortness of breath. Negative for wheezing.   Cardiovascular: Negative for chest pain, palpitations and leg swelling.       Objective:   Physical Exam   Filed Vitals:   11/22/13 1139  BP: 128/64  Pulse: 62  Height: 5' 3"  (1.6 m)  Weight: 188 lb (85.276 kg)  SpO2: 98%  RA  Gen: well appearing, no acute distress HEENT: NCAT, EOMi, OP clear, PULM: Diminished R base CV: RRR, no mgr, no JVD AB: BS+, soft, nontender, no hsm Ext: warm, no edema, no clubbing, no cyanosis  January 2014 CXR [facility] reviewed by me with vascular congestion and cardiomegally, no mass.  10/22/2012 CT chest >> 11 mm right middle lobe nodule unchanged, borderline enlarged mediastinal lymphadenopathy unchanged, there is a right pleural effusion which is new, there is atelectasis in the right base     Assessment & Plan:   Pleural effusion In June of 2014 she had a lymphocytic predominant and triglyceride rich pleural effusion. At that point I was concerned about lymphangitic obstruction. Fortunately, there has not been any change in her baseline lymphadenopathy which up until this point has been attributed to sarcoidosis. There is no clear evidence that there is a malignant process at this time of any time you have a lipid rich pleural effusion that is a concern.  Plan: -Repeat chest  x-ray in one week to ensure resolution of the pleural effusion -Obtain records from [facility] to see the workup of the pleural effusion  Pulmonary sarcoidosis This is a difficult case. Considering her increasing dyspnea and wheezing it does make me wonder for sarcoid is not recurring somehow. She has been very intolerant of prednisone in the past and does not want to go back on prednisone. She was unable to cooperate with pulmonary function testing back in November for [place] to effectively use that as a data point for objectively assessing her disease.  Plan: -Start Advair for wheezing and shortness  of breath -Plan to repeat chest CT in 3-4 months to evaluate lymphadenopathy. At this point that has been considered to be related to her old diagnosis of sarcoid and I still feel like that is the case. However if it progresses then we may need to pursue a biopsy of the lymphadenopathy considering her lipid rich pleural effusion (to ensure that we are not dealing with a malignancy).    Updated Medication List Outpatient Encounter Prescriptions as of 11/22/2013  Medication Sig  . albuterol (PROVENTIL) (2.5 MG/3ML) 0.083% nebulizer solution Take 3 mLs (2.5 mg total) by nebulization every 6 (six) hours as needed for wheezing or shortness of breath.  . ALPRAZolam (XANAX) 0.5 MG tablet Take 1 tablet (0.5 mg total) by mouth daily as needed for sleep or anxiety.  [name] aspirin 81 MG EC tablet Take 1 tablet (81 mg total) by mouth daily. Swallow whole.  . B-D ULTRAFINE III SHORT PEN 31G X 8 MM MISC USE AS DIRECTED WITH NOVOLOG FLEXPEN  . Blood Glucose Monitoring Suppl (TRUERESULT BLOOD GLUCOSE) W/DEVICE KIT 1 kit by Does not apply route 3 (three) times daily.  . COMBIVENT RESPIMAT 20-100 MCG/ACT AERS respimat INHALE ONE PUFF EVERY 6 HOURS  . fluticasone (FLONASE) 50 MCG/ACT nasal spray Place 2 sprays into the nose daily.  . furosemide (LASIX) 20 MG tablet Take 20 mg by mouth daily.   . furosemide (LASIX) 20 MG tablet TAKE ONE TABLET BY MOUTH EVERY DAY  . glucose blood test strip Check blood sugar 3 times a day DX CODE: 250.0  . INS SYRINGE/NEEDLE 1CC/28G 28G X 1/2" 1 ML MISC 1 each by Does not apply route as needed. To test glucose levels daily.  . Insulin Aspart Prot & Aspart (NOVOLOG 70/30 MIX) (70-30) 100 UNIT/ML Pen 37 units before breakfast,  45 units before dinner.  NEEDS 10 PENS PER MONTH34 units before breakfast,  42 units before dinner  . isosorbide mononitrate (IMDUR) 30 MG 24 hr tablet Take 30 mg by mouth daily.  . Lancets 30G MISC Check blood glucose TID. Dx 250.00  . metoprolol (LOPRESSOR) 50 MG  tablet Take 0.5 tablets (25 mg total) by mouth 2 (two) times daily.  . metoprolol tartrate (LOPRESSOR) 25 MG tablet TAKE ONE TABLET BY MOUTH TWICE DAILY  . olmesartan (BENICAR) 20 MG tablet Take 1 tablet (20 mg total) by mouth daily.  [name] Phenylephrine-DM-GG-APAP 5-10-200-325 MG TABS Per bottle directions as needed  . pregabalin (LYRICA) 100 MG capsule Take 1 capsule (100 mg total) by mouth 3 (three) times daily.  . rosuvastatin (CRESTOR) 10 MG tablet Take 1 tablet (10 mg total) by mouth daily.  . TRUETEST TEST test strip CHECK BLOOD GLUCOSE THREE TIMES DAILY  . [DISCONTINUED] levofloxacin (LEVAQUIN) 750 MG tablet Take 750 mg by mouth every other day.

## 2013-11-22 NOTE — Patient Instructions (Signed)
Use the Advair one puff twice a day Use combivent as you are doing We will order a Chest x-ray> go to the [facility] office for this in one week We will request records from [place] We will see you back in 3 weeks or sooner if needed

## 2013-11-22 NOTE — Assessment & Plan Note (Signed)
In June of 2014 she had a lymphocytic predominant and triglyceride rich pleural effusion. At that point I was concerned about lymphangitic obstruction. Fortunately, there has not been any change in her baseline lymphadenopathy which up until this point has been attributed to sarcoidosis. There is no clear evidence that there is a malignant process at this time of any time you have a lipid rich pleural effusion that is a concern.  Plan: -Repeat chest x-ray in one week to ensure resolution of the pleural effusion -Obtain records from [facility] to see the workup of the pleural effusion

## 2013-11-22 NOTE — Telephone Encounter (Signed)
Pt accepted appt in [place].  Nothing further needed at this time. Pamela Collier

## 2013-11-22 NOTE — Assessment & Plan Note (Addendum)
Foot exam done,  Podiatry follow up in place for resolving pressure ulcer and management of callous. Continue Lyrica

## 2013-11-22 NOTE — Addendum Note (Signed)
Addended by: [name] on: 11/22/2013 05:23 PM   Modules accepted: Orders

## 2013-11-23 ENCOUNTER — Encounter: Payer: Self-pay | Admitting: Pulmonary Disease

## 2013-11-27 DIAGNOSIS — Z0279 Encounter for issue of other medical certificate: Secondary | ICD-10-CM

## 2013-11-28 ENCOUNTER — Other Ambulatory Visit (INDEPENDENT_AMBULATORY_CARE_PROVIDER_SITE_OTHER): Payer: Medicare Other

## 2013-11-28 ENCOUNTER — Ambulatory Visit: Payer: Self-pay | Admitting: Pulmonary Disease

## 2013-11-28 DIAGNOSIS — J90 Pleural effusion, not elsewhere classified: Secondary | ICD-10-CM | POA: Diagnosis not present

## 2013-11-28 DIAGNOSIS — N9989 Other postprocedural complications and disorders of genitourinary system: Secondary | ICD-10-CM | POA: Diagnosis not present

## 2013-11-28 DIAGNOSIS — IMO0002 Reserved for concepts with insufficient information to code with codable children: Secondary | ICD-10-CM

## 2013-11-28 DIAGNOSIS — J189 Pneumonia, unspecified organism: Secondary | ICD-10-CM | POA: Diagnosis not present

## 2013-11-28 DIAGNOSIS — N990 Postprocedural (acute) (chronic) kidney failure: Secondary | ICD-10-CM

## 2013-11-28 LAB — BASIC METABOLIC PANEL
BUN: 19 mg/dL (ref 6–23)
CALCIUM: 10 mg/dL (ref 8.4–10.5)
CO2: 28 meq/L (ref 19–32)
CREATININE: 1.1 mg/dL (ref 0.4–1.2)
Chloride: 104 mEq/L (ref 96–112)
GFR: 61.87 mL/min (ref >60.00)
Glucose, Bld: 146 mg/dL — ABNORMAL HIGH (ref 70–99)
Potassium: 5 mEq/L (ref 3.5–5.1)
Sodium: 138 mEq/L (ref 135–145)

## 2013-12-02 ENCOUNTER — Encounter: Payer: Self-pay | Admitting: *Deleted

## 2013-12-02 ENCOUNTER — Telehealth: Payer: Self-pay | Admitting: Internal Medicine

## 2013-12-02 DIAGNOSIS — E119 Type 2 diabetes mellitus without complications: Secondary | ICD-10-CM | POA: Diagnosis not present

## 2013-12-02 DIAGNOSIS — M79609 Pain in unspecified limb: Secondary | ICD-10-CM | POA: Diagnosis not present

## 2013-12-02 DIAGNOSIS — M7989 Other specified soft tissue disorders: Secondary | ICD-10-CM | POA: Diagnosis not present

## 2013-12-02 DIAGNOSIS — I739 Peripheral vascular disease, unspecified: Secondary | ICD-10-CM | POA: Diagnosis not present

## 2013-12-02 NOTE — Telephone Encounter (Signed)
Pt came in and dropped of the list of her blood sugar. Left in Dr. Demetrios Isaacs box.

## 2013-12-02 NOTE — Telephone Encounter (Signed)
Placed in result folder.

## 2013-12-02 NOTE — Progress Notes (Signed)
Chart reviewed for DM bundle. Last OV 11/21/13 and labs. Appt sch 12/23/13 BP 118/50

## 2013-12-05 ENCOUNTER — Telehealth: Payer: Self-pay | Admitting: Internal Medicine

## 2013-12-05 MED ORDER — INSULIN ASPART PROT & ASPART (70-30 MIX) 100 UNIT/ML PEN: PEN_INJECTOR | SUBCUTANEOUS | Status: DC

## 2013-12-05 NOTE — Telephone Encounter (Signed)
Blood sugars reviewed.  They are still elevated 95% of the time   ,  Please increase the evening dose of insulin to 47 units and the morning dose  to 40 units

## 2013-12-05 NOTE — Telephone Encounter (Signed)
Patient notified and voiced understanding as to dosage change.

## 2013-12-07 ENCOUNTER — Telehealth: Payer: Self-pay | Admitting: Internal Medicine

## 2013-12-07 NOTE — Telephone Encounter (Signed)
Placed in Red folder filled, Back of form for MD

## 2013-12-07 NOTE — Telephone Encounter (Signed)
Left message for patient to return call to office to find out why we are filling out so we can but the appropriate information.

## 2013-12-07 NOTE — Telephone Encounter (Signed)
Pt came in and dropped off some insurance forms to be filled out by Dr. Derrel Nip. Placed in box.

## 2013-12-08 ENCOUNTER — Telehealth: Payer: Self-pay | Admitting: Internal Medicine

## 2013-12-08 NOTE — Telephone Encounter (Signed)
Form completed and returned to you

## 2013-12-09 NOTE — Telephone Encounter (Signed)
Form copied for billing and sent to scan patient aware, and placed at front desk for pick up.

## 2013-12-14 ENCOUNTER — Ambulatory Visit: Payer: Self-pay | Admitting: Internal Medicine

## 2013-12-14 DIAGNOSIS — G4733 Obstructive sleep apnea (adult) (pediatric): Secondary | ICD-10-CM | POA: Diagnosis not present

## 2013-12-17 DIAGNOSIS — G4733 Obstructive sleep apnea (adult) (pediatric): Secondary | ICD-10-CM | POA: Diagnosis not present

## 2013-12-20 ENCOUNTER — Ambulatory Visit (INDEPENDENT_AMBULATORY_CARE_PROVIDER_SITE_OTHER)
Admission: RE | Admit: 2013-12-20 | Discharge: 2013-12-20 | Disposition: A | Discharge status: Home / Self Care | Payer: Medicare Other | Source: Ambulatory Visit | Attending: Pulmonary Disease | Admitting: Pulmonary Disease

## 2013-12-20 ENCOUNTER — Encounter: Payer: Self-pay | Admitting: Pulmonary Disease

## 2013-12-20 ENCOUNTER — Ambulatory Visit (INDEPENDENT_AMBULATORY_CARE_PROVIDER_SITE_OTHER): Payer: Medicare Other | Admitting: Pulmonary Disease

## 2013-12-20 ENCOUNTER — Ambulatory Visit: Payer: Medicare Other | Admitting: Pulmonary Disease

## 2013-12-20 VITALS — BP 132/64 | HR 60 | Ht 63.0 in | Wt 190.0 lb

## 2013-12-20 DIAGNOSIS — J99 Respiratory disorders in diseases classified elsewhere: Secondary | ICD-10-CM

## 2013-12-20 DIAGNOSIS — D869 Sarcoidosis, unspecified: Secondary | ICD-10-CM | POA: Diagnosis not present

## 2013-12-20 DIAGNOSIS — R599 Enlarged lymph nodes, unspecified: Secondary | ICD-10-CM | POA: Diagnosis not present

## 2013-12-20 DIAGNOSIS — J90 Pleural effusion, not elsewhere classified: Secondary | ICD-10-CM

## 2013-12-20 DIAGNOSIS — R911 Solitary pulmonary nodule: Secondary | ICD-10-CM

## 2013-12-20 DIAGNOSIS — D860 Sarcoidosis of lung: Secondary | ICD-10-CM

## 2013-12-20 DIAGNOSIS — I2510 Atherosclerotic heart disease of native coronary artery without angina pectoris: Secondary | ICD-10-CM | POA: Diagnosis not present

## 2013-12-20 DIAGNOSIS — R590 Localized enlarged lymph nodes: Secondary | ICD-10-CM

## 2013-12-20 NOTE — Assessment & Plan Note (Addendum)
She has had 2 separate pleural effusions in the last year with a lymphocyte rich but cytology negative fluid analysis. I've explained to her that at this point there is no evidence of underlying malignancy and I question whether or not this is somehow related to lymphatic obstruction from her prior sarcoidosis. However, because this would be a fairly unusual presentation I like to keep a close eye on the pleural effusion as well as her mediastinal lymphadenopathy. If she has evidence of change in the mediastinal lymph nodes and we will arrange for a biopsy to ensure there is no evidence of malignancy. Alternatively, if there is evidence of recurrence of the pleural effusion then I will perform a thoracentesis myself and send the fluid off for flow cytometry analysis to ensure there is no evidence of the lymphoma. Again, I think this is less likely.  Plan:  CXR today

## 2013-12-20 NOTE — Progress Notes (Signed)
Subjective:    Patient ID: Pamela Collier, female    DOB: 01/04/1941, 73 y.o.   MRN: [id number]  Synopsis: This is a 73 year old female first seen by Dr. Melvyn Novas in the [facility] pulmonary office in January 2014 for pulmonary nodule and some shortness of breath. An ACE inhibitor was held during that visit and followup imaging was planned.  HPI  11/22/2013 ROV > Ly[place] says that she has been gaining more weight and feels terrible.  She thinks that it is fluid coming back.  However her recent Chest Xray did not show this.  She feels a funny feeling in her chest after she eats.  She sometimes feels indigestion, and feels like something is getting stuck when she eats.  She feels like her arms are getting thinner.  She feels generally sluggish and has been wheezing more lately.  She feels like fluid has been coming back in her legs.  She has been taking lasix twice a day this week because of ankle swelling, this has helped.  She is to see Tullo on Friday.  She has been having back pain as well.  She had a sleep study last week.   She doesn't think that the Advair is helping much.  However, she thinks that the combivent helped.    Past Medical History  Diagnosis Date  . Diabetes mellitus   . 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 Cardiomyopathy, ischemic Jan 2013    EF 35 to 40% [facility])  . Hepatic steatosis     by CT abd pelvis  . Osteoporosis, post-menopausal   . 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  HENT: Negative for congestion, postnasal drip and rhinorrhea.   Respiratory: Positive for cough and shortness of breath. Negative for wheezing.   Cardiovascular: Negative for chest pain, palpitations and leg swelling.        Objective:   Physical Exam   Filed Vitals:   12/20/13 1549  BP: 132/64  Pulse: 60  Height: 5' 3" (1.6 m)  Weight: 190 lb (86.183 kg)  SpO2: 99%  RA  Gen: well appearing, no acute distress HEENT: NCAT, EOMi, OP clear, PULM: Diminished R base CV: RRR, no mgr, no JVD AB: BS+, soft, nontender, no hsm Ext: warm, no edema, no clubbing, no cyanosis  January 2014 CXR [facility] reviewed by me with vascular congestion and cardiomegally, no mass.  Incidental finding on contrasted chest CT done to rule out pulmonary embolus, [facility] CT chest May 06 2012  RLL as dz RUL ll mm nodule Mulitple LN's some partially calcified 10/22/2012 CT chest >> 11 mm right middle lobe nodule unchanged, borderline enlarged mediastinal lymphadenopathy unchanged, there is a right pleural effusion which is new, there is atelectasis in the right base 03/2013 CT chest > slightly larger R pleural effusion, new trace left pleural effusion, RUL nodule unchanged 12x53m; RLL atelectasis; calcified lymphadenopathy     Assessment & Plan:   Pulmonary nodule seen on imaging study I believe that the mediastinal lymphadenopathy is 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 However, considering her lipid rich pleural effusion think it is worthwhile to repeat CT chest this year to make sure that the lymphadenopathy has not changed  since November 2014. If there is evidence of growth then she'll need to have a biopsy of the mediastinal lymph nodes.  Plan:  CT chest 2 months  Pleural effusion She has had 2 separate pleural effusions in the last year with a lymphocyte rich but cytology negative fluid analysis. I've explained to her that at this point there is no evidence of underlying malignancy and I question whether or not this is somehow related to lymphatic obstruction from her prior sarcoidosis. However, because this would be a fairly unusual presentation I like to keep a close eye on the pleural effusion as well as  her mediastinal lymphadenopathy. If she has evidence of change in the mediastinal lymph nodes and we will arrange for a biopsy to ensure there is no evidence of malignancy. Alternatively, if there is evidence of recurrence of the pleural effusion then I will perform a thoracentesis myself and send the fluid off for flow cytometry analysis to ensure there is no evidence of the lymphoma. Again, I think this is less likely.  Plan:  CXR today    Updated Medication List Outpatient Encounter Prescriptions as of 12/20/2013  Medication Sig  . albuterol (PROVENTIL) (2.5 MG/3ML) 0.083% nebulizer solution Take 3 mLs (2.5 mg total) by nebulization every 6 (six) hours as needed for wheezing or shortness of breath.  . ALPRAZolam (XANAX) 0.5 MG tablet Take 1 tablet (0.5 mg total) by mouth daily as needed for sleep or anxiety.  [name] aspirin 81 MG EC tablet Take 1 tablet (81 mg total) by mouth daily. Swallow whole.  . B-D ULTRAFINE III SHORT PEN 31G X 8 MM MISC USE AS DIRECTED WITH NOVOLOG FLEXPEN  . Blood Glucose Monitoring Suppl (TRUERESULT BLOOD GLUCOSE) W/DEVICE KIT 1 kit by Does not apply route 3 (three) times daily.  . COMBIVENT RESPIMAT 20-100 MCG/ACT AERS respimat INHALE ONE PUFF EVERY 6 HOURS  . fluticasone (FLONASE) 50 MCG/ACT nasal spray Place 2 sprays into the nose daily.  . furosemide (LASIX) 20 MG tablet Take 20 mg by mouth daily.   . furosemide (LASIX) 20 MG tablet TAKE ONE TABLET BY MOUTH EVERY DAY  . glucose blood test strip Check blood sugar 3 times a day DX CODE: 250.0  . INS SYRINGE/NEEDLE 1CC/28G 28G X 1/2" 1 ML MISC 1 each by Does not apply route as needed. To test glucose levels daily.  . Insulin Aspart Prot & Aspart (NOVOLOG 70/30 MIX) (70-30) 100 UNIT/ML Pen 40 units before breakfast,  47 units before dinner.  NEEDS 10 PENS PER MONTH34 units before breakfast,  42 units before dinner  . isosorbide mononitrate (IMDUR) 30 MG 24 hr tablet Take 30 mg by mouth daily.  . Lancets 30G MISC Check  blood glucose TID. Dx 250.00  . metoprolol (LOPRESSOR) 50 MG tablet Take 0.5 tablets (25 mg total) by mouth 2 (two) times daily.  . metoprolol tartrate (LOPRESSOR) 25 MG tablet TAKE ONE TABLET BY MOUTH TWICE DAILY  . olmesartan (BENICAR) 20 MG tablet Take 1 tablet (20 mg total) by mouth daily.  [name] Phenylephrine-DM-GG-APAP 5-10-200-325 MG TABS Per bottle directions as needed  . pregabalin (LYRICA) 100 MG capsule Take 1 capsule (100 mg total) by mouth 3 (three) times daily.  . rosuvastatin (CRESTOR) 10 MG tablet Take 1 tablet (10 mg total) by mouth daily.  Bud Face TEST test strip CHECK BLOOD GLUCOSE THREE TIMES DAILY

## 2013-12-20 NOTE — Patient Instructions (Addendum)
Take the combivent 3-4 times a day as needed for shortness of breath We will call you with the results of today's chest X-ray We will order a CT scan of your chest for 6 weeks from now We will see you back in 2 months or sooner if needed

## 2013-12-20 NOTE — Assessment & Plan Note (Addendum)
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 However, considering her lipid rich pleural effusion think it is worthwhile to repeat CT chest this year to make sure that the lymphadenopathy has not changed since November 2014. If there is evidence of growth then she'll need to have a biopsy of the mediastinal lymph nodes.  Plan:  CT chest 2 months

## 2013-12-21 ENCOUNTER — Telehealth: Payer: Self-pay | Admitting: Internal Medicine

## 2013-12-21 DIAGNOSIS — E119 Type 2 diabetes mellitus without complications: Secondary | ICD-10-CM | POA: Diagnosis not present

## 2013-12-21 DIAGNOSIS — G4733 Obstructive sleep apnea (adult) (pediatric): Secondary | ICD-10-CM

## 2013-12-21 NOTE — Telephone Encounter (Signed)
Her CPAP study was done and new mask has been ordered

## 2013-12-23 ENCOUNTER — Encounter: Payer: Self-pay | Admitting: Internal Medicine

## 2013-12-23 ENCOUNTER — Ambulatory Visit (INDEPENDENT_AMBULATORY_CARE_PROVIDER_SITE_OTHER): Payer: Medicare Other | Admitting: Internal Medicine

## 2013-12-23 VITALS — BP 130/72 | HR 72 | Temp 98.6°F | Resp 16 | Wt 188.1 lb

## 2013-12-23 DIAGNOSIS — Z79899 Other long term (current) drug therapy: Secondary | ICD-10-CM

## 2013-12-23 DIAGNOSIS — G4733 Obstructive sleep apnea (adult) (pediatric): Secondary | ICD-10-CM | POA: Diagnosis not present

## 2013-12-23 DIAGNOSIS — F341 Dysthymic disorder: Secondary | ICD-10-CM

## 2013-12-23 DIAGNOSIS — D860 Sarcoidosis of lung: Secondary | ICD-10-CM

## 2013-12-23 DIAGNOSIS — E1129 Type 2 diabetes mellitus with other diabetic kidney complication: Secondary | ICD-10-CM

## 2013-12-23 DIAGNOSIS — I2589 Other forms of chronic ischemic heart disease: Secondary | ICD-10-CM

## 2013-12-23 DIAGNOSIS — J99 Respiratory disorders in diseases classified elsewhere: Secondary | ICD-10-CM

## 2013-12-23 DIAGNOSIS — E538 Deficiency of other specified B group vitamins: Secondary | ICD-10-CM | POA: Diagnosis not present

## 2013-12-23 DIAGNOSIS — D869 Sarcoidosis, unspecified: Secondary | ICD-10-CM

## 2013-12-23 DIAGNOSIS — R1310 Dysphagia, unspecified: Secondary | ICD-10-CM

## 2013-12-23 DIAGNOSIS — F418 Other specified anxiety disorders: Secondary | ICD-10-CM

## 2013-12-23 DIAGNOSIS — E1165 Type 2 diabetes mellitus with hyperglycemia: Secondary | ICD-10-CM

## 2013-12-23 DIAGNOSIS — I255 Ischemic cardiomyopathy: Secondary | ICD-10-CM

## 2013-12-23 LAB — BASIC METABOLIC PANEL
BUN: 21 mg/dL (ref 6–23)
CO2: 28 mEq/L (ref 19–32)
Calcium: 10.5 mg/dL (ref 8.4–10.5)
Chloride: 101 mEq/L (ref 96–112)
Creatinine, Ser: 1.2 mg/dL (ref 0.4–1.2)
GFR: 55.99 mL/min — AB (ref >60.00)
Glucose, Bld: 61 mg/dL — ABNORMAL LOW (ref 70–99)
Potassium: 4.2 mEq/L (ref 3.5–5.1)
SODIUM: 136 meq/L (ref 135–145)

## 2013-12-23 LAB — VITAMIN B12: VITAMIN B 12: 284 pg/mL (ref 211–911)

## 2013-12-23 MED ORDER — PAROXETINE HCL 10 MG PO TABS: 10.0000 mg | ORAL_TABLET | Freq: Every day | ORAL | Status: DC

## 2013-12-23 MED ORDER — CYANOCOBALAMIN 1000 MCG/ML IJ SOLN
1000.0000 ug | Freq: Once | INTRAMUSCULAR | Status: AC
  Administered 2013-12-23: 1000 ug via INTRAMUSCULAR

## 2013-12-23 MED ORDER — INSULIN ASPART PROT & ASPART (70-30 MIX) 100 UNIT/ML PEN: PEN_INJECTOR | SUBCUTANEOUS | Status: DC

## 2013-12-23 NOTE — Progress Notes (Signed)
Pre visit review using our clinic review tool, if applicable. No additional management support is needed unless otherwise documented below in the visit note. 

## 2013-12-23 NOTE — Patient Instructions (Addendum)
We are increasing your insulin today:  44 units in the morning and 50 units in the evening  I am adding Paxil (generic) to help your mood and anxiety   We have ordered you a new mask for your sleep machine.    I will check with Dr Lake Bells about starting you on prednisone.   You can reduce your fluid pill to once daily  take an extra dose if you weight increase by 2 lbs overnight or 5 lbs over a week   You received a b12 shot today,  If your level is low we  An continue giving you theses

## 2013-12-23 NOTE — Progress Notes (Signed)
Patient ID: Pamela Collier, female   DOB: [birth year], 73 y.o.   MRN: [id number]  Patient Active Problem List   Diagnosis Date Noted  . Arthritis 08/05/2013  . Unsteady gait 03/24/2013  . History of colonic polyps 02/20/2013  . Need for prophylactic vaccination and inoculation against influenza 02/03/2013  . OSA (obstructive sleep apnea) 12/07/2012  . 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 Dysphagia 12/07/2012  . Pleural effusion 10/25/2012  . Cough 10/25/2012  . Hospital discharge follow-up 09/17/2012  . SOB (shortness of breath) 05/27/2012  . Pulmonary nodule seen on imaging study 05/20/2012  . Heart failure, diastolic, acute 94/85/4627  . 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 Cardiomyopathy, ischemic   . Hepatic steatosis   . Osteoporosis, post-menopausal   . 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 Atherosclerotic peripheral vascular disease with gangrene   . Type II or unspecified type diabetes mellitus with renal manifestations, uncontrolled 12/14/2011  . Double vessel coronary artery disease 12/14/2011    Subjective:  CC:   No chief complaint on file.   HPI:   Pamela Collier is a 73 y.o. female who presents for Follow up on multiple issues include DM type 2 poorly controlled, chronic dyspnea secondary to sarcoid, and edema.  Last seen on July 14th , after being discharged from [facility] on July 9, at which time her doses of insulin were changed to address  Her persistently elevated blood sugars   She had a repeat sleep study done in the interim due to reports of persistent morning headaches and persistent malaise/fatigue.     Saw  Pulmonology on Tuesday for wheezing.  She declined steroid therapy offered by pulmnology due to concern about diabetes control. She is asking my opinion.   Seeing podiatry for pressure ulcer  On her foot.   She continues suffer from depression with anxiety  Aggravated  by her concern about her health problems and her lack of emotional support from her husband.   Edema:  She has been increasing her lasix to twice daily for wt gain to 191 .  Edema has now resolved    Past Medical History  Diagnosis Date  . Diabetes mellitus   . 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 Cardiomyopathy, ischemic Jan 2013    EF 35 to 40% [facility])  . Hepatic steatosis     by CT abd pelvis  . Osteoporosis, post-menopausal   . 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 Abdominal hysterectomy      at ge 39. secondary to bleeding  . Toe amputation  Sept 2012    Right 2nd toe, [place]  . Ptca  August 2012    Right Posterior tibial artery , [place]       The following portions of the patient's history were reviewed and updated as appropriate: Allergies, current medications, and problem list.    Review of Systems:   Patient denies headache, fevers, malaise, unintentional weight loss, skin rash, eye pain, sinus congestion and sinus pain, sore throat, dysphagia,  hemoptysis , cough, dyspnea, wheezing, chest pain, palpitations, orthopnea, edema, abdominal pain, nausea, melena, diarrhea, constipation, flank pain, dysuria, hematuria, urinary  Frequency,  nocturia, numbness, tingling, seizures,  Focal weakness, Loss of consciousness,  Tremor, insomnia, depression, anxiety, and suicidal ideation.     History   Social History  . Marital Status: Married    Spouse Name: N/A    Number of Children: N/A  . Years of Education: N/A   Occupational History  . Not on file.   Social History Main Topics  . Smoking status: Never Smoker   . Smokeless tobacco: Never Used  . Alcohol Use: No  . Drug Use: No  . Sexual Activity: Yes   Other  Topics Concern  . Not on file   Social History Narrative  . No narrative on file    Objective:  Filed Vitals:   12/23/13 1056  BP: 130/72  Pulse: 72  Temp: 98.6 F (37 C)  Resp: 16     General appearance: alert, cooperative and appears stated age Ears: normal TM's and external ear canals both ears Throat: lips, mucosa, and tongue normal; teeth and gums normal Neck: no adenopathy, no carotid bruit, supple, symmetrical, trachea midline and thyroid not enlarged, symmetric, no tenderness/mass/nodules Back: symmetric, no curvature. ROM normal. No CVA tenderness. Lungs: clear to auscultation bilaterally Heart: regular rate and rhythm, S1, S2 normal, no murmur, click, rub or gallop Abdomen: soft, non-tender; bowel sounds normal; no masses,  no organomegaly Pulses: 2+ and symmetric Skin: Skin color, texture, turgor normal. No rashes or lesions Lymph nodes: Cervical, supraclavicular, and axillary nodes normal.  Assessment and Plan:  Cardiomyopathy, ischemic Despite a normal EF  On January 2014 ECHO, she has recurrent weight gain and lower extrremity edema accompanied by shortness of breat which responds to increased lasix dose.  I suspect the EF shown on the Jan 2013 cath is more accurate.  Advised patient to continue to monitor wt daily and double the lasix dose for overnight weight gain of 2 lbs or weekly weight gain of 5 lbs.   OSA (obstructive sleep apnea) Repeat study was done on December 14 2013 and CPAP titrated to 8 cm H20. She is wearing her CPAP every night a minimum of 6 hours per night but is waiting for the new mask which will be more comfortable for her.   Dysphagia Continue protonix for history of stricture with nonmalignant biopsy 2014  Type II or unspecified type diabetes mellitus with renal manifestations, uncontrolled Increased her dose of 70/30 to 44 units in the AM and 50 units in the PM.  Pulmonary sarcoidosis The patient states that Dr Lake Bells recommended  steroid therapy but she declined because of the deleterious effect on her diabetes control.  I have advised her that if her pulmonologist felt that sterid were improve her chronic dyspnea , that I agree with steroid therapy and would be happy to adjust her insulin weekly if necessary to improve glycemic control.   Depression with anxiety Her chief symptoms are malaise and anxiety surrounding her serious health issues. Continue prn alprazolam,  Adding paxil today .  Return in one month    A total of 40 minutes was spent with patient more than half of which was spent in counseling patient on the above mentioned issues and coordination of care.  Updated Medication List Outpatient Encounter Prescriptions as of 12/23/2013  Medication Sig  . albuterol (PROVENTIL) (2.5 MG/3ML) 0.083% nebulizer solution Take 3 mLs (2.5 mg total) by nebulization every 6 (six) hours as needed for wheezing or shortness of breath.  . ALPRAZolam (XANAX) 0.5 MG tablet Take 1 tablet (0.5 mg total)  by mouth daily as needed for sleep or anxiety.  [name] aspirin 81 MG EC tablet Take 1 tablet (81 mg total) by mouth daily. Swallow whole.  . B-D ULTRAFINE III SHORT PEN 31G X 8 MM MISC USE AS DIRECTED WITH NOVOLOG FLEXPEN  . Blood Glucose Monitoring Suppl (TRUERESULT BLOOD GLUCOSE) W/DEVICE KIT 1 kit by Does not apply route 3 (three) times daily.  . COMBIVENT RESPIMAT 20-100 MCG/ACT AERS respimat INHALE ONE PUFF EVERY 6 HOURS  . fluticasone (FLONASE) 50 MCG/ACT nasal spray Place 2 sprays into the nose daily.  . furosemide (LASIX) 20 MG tablet Take 20 mg by mouth daily.   . furosemide (LASIX) 20 MG tablet TAKE ONE TABLET BY MOUTH EVERY DAY  . glucose blood test strip Check blood sugar 3 times a day DX CODE: 250.0  . INS SYRINGE/NEEDLE 1CC/28G 28G X 1/2" 1 ML MISC 1 each by Does not apply route as needed. To test glucose levels daily.  . Insulin Aspart Prot & Aspart (NOVOLOG 70/30 MIX) (70-30) 100 UNIT/ML Pen 44 units before breakfast,  50  units before dinner.  NEEDS 12 PENS PER MONTH  . isosorbide mononitrate (IMDUR) 30 MG 24 hr tablet Take 30 mg by mouth daily.  . Lancets 30G MISC Check blood glucose TID. Dx 250.00  . metoprolol (LOPRESSOR) 50 MG tablet Take 0.5 tablets (25 mg total) by mouth 2 (two) times daily.  . metoprolol tartrate (LOPRESSOR) 25 MG tablet TAKE ONE TABLET BY MOUTH TWICE DAILY  . olmesartan (BENICAR) 20 MG tablet Take 1 tablet (20 mg total) by mouth daily.  [name] Phenylephrine-DM-GG-APAP 5-10-200-325 MG TABS Per bottle directions as needed  . pregabalin (LYRICA) 100 MG capsule Take 1 capsule (100 mg total) by mouth 3 (three) times daily.  . rosuvastatin (CRESTOR) 10 MG tablet Take 1 tablet (10 mg total) by mouth daily.  . TRUETEST TEST test strip CHECK BLOOD GLUCOSE THREE TIMES DAILY  . [DISCONTINUED] Insulin Aspart Prot & Aspart (NOVOLOG 70/30 MIX) (70-30) 100 UNIT/ML Pen 40 units before breakfast,  47 units before dinner.  NEEDS 10 PENS PER MONTH34 units before breakfast,  42 units before dinner  . PARoxetine (PAXIL) 10 MG tablet Take 1 tablet (10 mg total) by mouth daily.     Orders Placed This Encounter  Procedures  . Basic metabolic panel  . B12    Return in about 2 months (around 02/22/2014) for follow up diabetes.

## 2013-12-25 NOTE — Assessment & Plan Note (Signed)
Despite a normal EF  On January 2014 ECHO, she has recurrent weight gain and lower extrremity edema accompanied by shortness of breat which responds to increased lasix dose.  I suspect the EF shown on the Jan 2013 cath is more accurate.  Advised patient to continue to monitor wt daily and double the lasix dose for overnight weight gain of 2 lbs or weekly weight gain of 5 lbs.

## 2013-12-25 NOTE — Assessment & Plan Note (Signed)
The patient states that Dr Lake Bells recommended steroid therapy but she declined because of the deleterious effect on her diabetes control.  I have advised her that if her pulmonologist felt that sterid were improve her chronic dyspnea , that I agree with steroid therapy and would be happy to adjust her insulin weekly if necessary to improve glycemic control.

## 2013-12-25 NOTE — Assessment & Plan Note (Signed)
Continue protonix for history of stricture with nonmalignant biopsy 2014

## 2013-12-25 NOTE — Assessment & Plan Note (Signed)
Her chief symptoms are malaise and anxiety surrounding her serious health issues. Continue prn alprazolam,  Adding paxil today .  Return in one month

## 2013-12-25 NOTE — Assessment & Plan Note (Signed)
Repeat study was done on December 14 2013 and CPAP titrated to 8 cm H20. She is wearing her CPAP every night a minimum of 6 hours per night but is waiting for the new mask which will be more comfortable for her.

## 2013-12-25 NOTE — Assessment & Plan Note (Signed)
Increased her dose of 70/30 to 44 units in the AM and 50 units in the PM.

## 2013-12-26 ENCOUNTER — Other Ambulatory Visit: Payer: Self-pay | Admitting: *Deleted

## 2013-12-26 MED ORDER — INSULIN PEN NEEDLE 31G X 8 MM MISC: Status: DC

## 2013-12-26 MED ORDER — INSULIN ASPART PROT & ASPART (70-30 MIX) 100 UNIT/ML PEN: PEN_INJECTOR | SUBCUTANEOUS | Status: DC

## 2013-12-28 DIAGNOSIS — E119 Type 2 diabetes mellitus without complications: Secondary | ICD-10-CM | POA: Diagnosis not present

## 2014-01-03 DIAGNOSIS — I739 Peripheral vascular disease, unspecified: Secondary | ICD-10-CM | POA: Diagnosis not present

## 2014-01-03 DIAGNOSIS — M79609 Pain in unspecified limb: Secondary | ICD-10-CM | POA: Diagnosis not present

## 2014-01-03 DIAGNOSIS — E119 Type 2 diabetes mellitus without complications: Secondary | ICD-10-CM | POA: Diagnosis not present

## 2014-01-03 DIAGNOSIS — M7989 Other specified soft tissue disorders: Secondary | ICD-10-CM | POA: Diagnosis not present

## 2014-01-12 ENCOUNTER — Ambulatory Visit: Payer: Self-pay | Admitting: Vascular Surgery

## 2014-01-12 DIAGNOSIS — I2510 Atherosclerotic heart disease of native coronary artery without angina pectoris: Secondary | ICD-10-CM | POA: Diagnosis not present

## 2014-01-12 DIAGNOSIS — Z79899 Other long term (current) drug therapy: Secondary | ICD-10-CM | POA: Diagnosis not present

## 2014-01-12 DIAGNOSIS — L97509 Non-pressure chronic ulcer of other part of unspecified foot with unspecified severity: Secondary | ICD-10-CM | POA: Diagnosis not present

## 2014-01-12 DIAGNOSIS — E119 Type 2 diabetes mellitus without complications: Secondary | ICD-10-CM | POA: Diagnosis not present

## 2014-01-12 DIAGNOSIS — I10 Essential (primary) hypertension: Secondary | ICD-10-CM | POA: Diagnosis not present

## 2014-01-12 DIAGNOSIS — I70219 Atherosclerosis of native arteries of extremities with intermittent claudication, unspecified extremity: Secondary | ICD-10-CM | POA: Diagnosis not present

## 2014-01-12 DIAGNOSIS — Z7902 Long term (current) use of antithrombotics/antiplatelets: Secondary | ICD-10-CM | POA: Diagnosis not present

## 2014-01-12 DIAGNOSIS — E785 Hyperlipidemia, unspecified: Secondary | ICD-10-CM | POA: Diagnosis not present

## 2014-01-12 DIAGNOSIS — E7800 Pure hypercholesterolemia, unspecified: Secondary | ICD-10-CM | POA: Diagnosis not present

## 2014-01-12 LAB — BASIC METABOLIC PANEL
Anion Gap: 6 — ABNORMAL LOW (ref 7–16)
BUN: 30 mg/dL — AB (ref 7–18)
CALCIUM: 9.5 mg/dL (ref 8.5–10.1)
CREATININE: 1.43 mg/dL — AB (ref 0.60–1.30)
Chloride: 108 mmol/L — ABNORMAL HIGH (ref 98–107)
Co2: 27 mmol/L (ref 21–32)
EGFR (African American): 42 — ABNORMAL LOW
EGFR (Non-African Amer.): 36 — ABNORMAL LOW
GLUCOSE: 130 mg/dL — AB (ref 65–99)
Osmolality: 289 (ref 275–301)
POTASSIUM: 4.8 mmol/L (ref 3.5–5.1)
SODIUM: 141 mmol/L (ref 136–145)

## 2014-01-13 ENCOUNTER — Telehealth: Payer: Self-pay | Admitting: Internal Medicine

## 2014-01-13 ENCOUNTER — Encounter: Payer: Self-pay | Admitting: Pulmonary Disease

## 2014-01-13 NOTE — Telephone Encounter (Signed)
Tell her to stop eating oatmeal for breakfast because it is a STARCH and will raise her blood sugars.  Tell her to take 15 units of 70/30 now,  And increase her water intake today so she doesn't get dehdyrated. Recheck her blood this afternoon after lunch and call if it is over 400  Find out what her blood sugars have  Been doing for the past several days   She should be eating eggs and [place] bacon or [place] sausage for breakfast.  There are no carbohydrates in either of these

## 2014-01-13 NOTE — Telephone Encounter (Signed)
Patient tnoified and voiced understanding by repeating directions patient fasting CBG's around 169 and 2 hour post prandials have been 178.

## 2014-01-13 NOTE — Telephone Encounter (Signed)
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 Patient does not want to go to ED with out advice of MD.

## 2014-01-20 DIAGNOSIS — R42 Dizziness and giddiness: Secondary | ICD-10-CM | POA: Diagnosis not present

## 2014-01-20 DIAGNOSIS — R0602 Shortness of breath: Secondary | ICD-10-CM | POA: Diagnosis not present

## 2014-01-20 DIAGNOSIS — I739 Peripheral vascular disease, unspecified: Secondary | ICD-10-CM | POA: Diagnosis not present

## 2014-01-20 DIAGNOSIS — I209 Angina pectoris, unspecified: Secondary | ICD-10-CM | POA: Diagnosis not present

## 2014-01-23 ENCOUNTER — Encounter: Payer: Self-pay | Admitting: Internal Medicine

## 2014-01-24 ENCOUNTER — Ambulatory Visit: Payer: Self-pay | Admitting: Pulmonary Disease

## 2014-01-24 ENCOUNTER — Telehealth: Payer: Self-pay | Admitting: Pulmonary Disease

## 2014-01-24 DIAGNOSIS — D869 Sarcoidosis, unspecified: Secondary | ICD-10-CM | POA: Diagnosis not present

## 2014-01-24 DIAGNOSIS — J90 Pleural effusion, not elsewhere classified: Secondary | ICD-10-CM | POA: Diagnosis not present

## 2014-01-24 DIAGNOSIS — R599 Enlarged lymph nodes, unspecified: Secondary | ICD-10-CM | POA: Diagnosis not present

## 2014-01-24 DIAGNOSIS — R911 Solitary pulmonary nodule: Secondary | ICD-10-CM | POA: Diagnosis not present

## 2014-01-24 NOTE — Telephone Encounter (Signed)
Pt decided not to leave a message.  Nothing further needed at this time.  Satira Anis

## 2014-01-31 LAB — HM DIABETES EYE EXAM

## 2014-02-01 ENCOUNTER — Encounter: Payer: Self-pay | Admitting: *Deleted

## 2014-02-01 DIAGNOSIS — I209 Angina pectoris, unspecified: Secondary | ICD-10-CM | POA: Diagnosis not present

## 2014-02-01 DIAGNOSIS — E1139 Type 2 diabetes mellitus with other diabetic ophthalmic complication: Secondary | ICD-10-CM | POA: Diagnosis not present

## 2014-02-01 DIAGNOSIS — R0602 Shortness of breath: Secondary | ICD-10-CM | POA: Diagnosis not present

## 2014-02-01 DIAGNOSIS — E1165 Type 2 diabetes mellitus with hyperglycemia: Secondary | ICD-10-CM | POA: Diagnosis not present

## 2014-02-05 ENCOUNTER — Encounter: Payer: Self-pay | Admitting: Pulmonary Disease

## 2014-02-06 ENCOUNTER — Telehealth: Payer: Self-pay

## 2014-02-06 NOTE — Telephone Encounter (Signed)
Message copied by Len Blalock on Mon Feb 06, 2014 12:22 PM ------      Message from: Simonne Maffucci B      Created: Sun Feb 05, 2014  2:22 AM       A,      Please let her know that her CT chest looked great and the fluid went away.      Thanks      B ------

## 2014-02-06 NOTE — Telephone Encounter (Signed)
Spoke with pt, she is aware of results and recs.  Nothing further needed. 

## 2014-02-07 DIAGNOSIS — E119 Type 2 diabetes mellitus without complications: Secondary | ICD-10-CM | POA: Diagnosis not present

## 2014-02-07 DIAGNOSIS — I739 Peripheral vascular disease, unspecified: Secondary | ICD-10-CM | POA: Diagnosis not present

## 2014-02-07 DIAGNOSIS — M79609 Pain in unspecified limb: Secondary | ICD-10-CM | POA: Diagnosis not present

## 2014-02-07 DIAGNOSIS — I70219 Atherosclerosis of native arteries of extremities with intermittent claudication, unspecified extremity: Secondary | ICD-10-CM | POA: Diagnosis not present

## 2014-02-09 DIAGNOSIS — E1042 Type 1 diabetes mellitus with diabetic polyneuropathy: Secondary | ICD-10-CM | POA: Diagnosis not present

## 2014-02-09 DIAGNOSIS — B351 Tinea unguium: Secondary | ICD-10-CM | POA: Diagnosis not present

## 2014-02-09 DIAGNOSIS — D2370 Other benign neoplasm of skin of unspecified lower limb, including hip: Secondary | ICD-10-CM | POA: Diagnosis not present

## 2014-02-13 ENCOUNTER — Encounter: Payer: Self-pay | Admitting: Pulmonary Disease

## 2014-02-13 ENCOUNTER — Other Ambulatory Visit: Payer: Self-pay | Admitting: Internal Medicine

## 2014-02-20 ENCOUNTER — Encounter: Payer: Self-pay | Admitting: Pulmonary Disease

## 2014-02-20 ENCOUNTER — Ambulatory Visit (INDEPENDENT_AMBULATORY_CARE_PROVIDER_SITE_OTHER): Payer: Medicare Other | Admitting: Pulmonary Disease

## 2014-02-20 VITALS — BP 128/68 | HR 61 | Ht 63.0 in | Wt 192.0 lb

## 2014-02-20 DIAGNOSIS — R911 Solitary pulmonary nodule: Secondary | ICD-10-CM | POA: Diagnosis not present

## 2014-02-20 DIAGNOSIS — R0602 Shortness of breath: Secondary | ICD-10-CM

## 2014-02-20 DIAGNOSIS — J90 Pleural effusion, not elsewhere classified: Secondary | ICD-10-CM

## 2014-02-20 DIAGNOSIS — I255 Ischemic cardiomyopathy: Secondary | ICD-10-CM | POA: Diagnosis not present

## 2014-02-20 DIAGNOSIS — Z23 Encounter for immunization: Secondary | ICD-10-CM

## 2014-02-20 NOTE — Progress Notes (Signed)
Subjective:    Patient ID: Pamela Collier, female    DOB: 1940/09/03, 73 y.o.   MRN: [id number]  Synopsis: This is a 73 year old female first seen by Dr. Melvyn Novas in the [facility] pulmonary office in January 2014 for pulmonary nodule and some shortness of breath. An ACE inhibitor was held during that visit and followup imaging was planned.  HPI  Chief Complaint  Patient presents with  . Follow-up    Pt c/o wheezing with exertion, prod cough with white mucus, pnd, runny nose.     02/20/2014 ROV > Pamela Collier has been doing OK and says that she still has some dyspnea on exertion.  No chest pain, just feels fatigued.  She wheezes more when she has a cold or a runny nose.  Lately she caught a cold and took cough medicine which helped. She was coughing up white phlegm and now the wheezing.  She has not had the fluid taken off since the last episode in June 2015. She has not had the fluid removed since.    Past Medical History  Diagnosis Date  . Diabetes mellitus   . 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 Cardiomyopathy, ischemic Jan 2013    EF 35 to 40% [facility])  . Hepatic steatosis     by CT abd pelvis  . Osteoporosis, post-menopausal   . 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  HENT: Negative for congestion, postnasal drip and rhinorrhea.   Respiratory: Positive for shortness of breath and wheezing. Negative for cough.   Cardiovascular: Negative for chest pain, palpitations and leg swelling.       Objective:   Physical Exam   Filed Vitals:   02/20/14 1105  BP: 128/68  Pulse: 61  Height: 5' 3"  (1.6 m)  Weight: 192 lb (87.091 kg)  SpO2: 99%  RA  Gen: well appearing, no acute distress HEENT: NCAT, EOMi,  OP clear, PULM: Diminished R base otherwise clear CV: RRR, no mgr, no JVD AB: BS+, soft, nontender, no hsm Ext: warm, no edema, no clubbing, no cyanosis  January 2014 CXR [facility] reviewed by me with vascular congestion and cardiomegally, no mass.  Incidental finding on contrasted chest CT done to rule out pulmonary embolus, [facility] CT chest May 06 2012  RLL as dz RUL ll mm nodule Mulitple LN's some partially calcified 10/22/2012 CT chest >> 11 mm right middle lobe nodule unchanged, borderline enlarged mediastinal lymphadenopathy unchanged, there is a right pleural effusion which is new, there is atelectasis in the right base 03/2013 CT chest > slightly larger R pleural effusion, new trace left pleural effusion, RUL nodule unchanged 12x44m; RLL atelectasis; calcified lymphadenopathy 05/2012 PFT> poor quality, ratio 78%, FEV1 1.02 (55%pred), FVC 1.3 (48% pred) 01/2014 CT chest > near complete resolution of the pleural effusion, stable lymphadenopathy     Assessment & Plan:   Pleural effusion Fortunately, this problem has not recurred. Her September 2015 showed near-complete resolution of the pleural effusion.  See discussions from my previous notes. She has had 2 separate pleural effusions drawn off in the last 18 months which showed 100% lymphocytes. 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 However, considering the fact that it has  not recurred and the only way to effectively treat it would be chronic prednisone we're going to elect to observe alone at this point. Prednisone would only make her blood sugar worse.  Plan: -If she has recurrence of the pleural effusion then I want her sent to me rather than anyone else so that I can perform the thoracentesis -If this happens I will send the fluid for flow cytometry to evaluate for lymphoma, which I doubt. - Followup 4 months with a chest x-ray  SOB (shortness of breath) Mild and multifactorial and mostly due to  deconditioning and obesity. I agree with using Combivent 4 times a day as sometimes airflow obstruction can be seen with sarcoid. We ordered pulmonary function tests but she was unable to adequately complete the study. I do not hear wheezing today.  Pulmonary nodule seen on imaging study This as well as the mediastinal lymphadenopathy have been stable on her most recent CT scans. No further imaging needed at this point.    Updated Medication List Outpatient Encounter Prescriptions as of 02/20/2014  Medication Sig  . albuterol (PROVENTIL) (2.5 MG/3ML) 0.083% nebulizer solution Take 3 mLs (2.5 mg total) by nebulization every 6 (six) hours as needed for wheezing or shortness of breath.  . ALPRAZolam (XANAX) 0.5 MG tablet Take 1 tablet (0.5 mg total) by mouth daily as needed for sleep or anxiety.  [name] aspirin 81 MG EC tablet Take 1 tablet (81 mg total) by mouth daily. Swallow whole.  . Blood Glucose Monitoring Suppl (TRUERESULT BLOOD GLUCOSE) W/DEVICE KIT 1 kit by Does not apply route 3 (three) times daily.  . COMBIVENT RESPIMAT 20-100 MCG/ACT AERS respimat INHALE ONE PUFF EVERY 6 HOURS  . CRESTOR 10 MG tablet TAKE ONE TABLET BY MOUTH ONCE DAILY  . fluticasone (FLONASE) 50 MCG/ACT nasal spray Place 2 sprays into the nose daily.  . furosemide (LASIX) 20 MG tablet Take 20 mg by mouth daily.   . furosemide (LASIX) 20 MG tablet TAKE ONE TABLET BY MOUTH EVERY DAY  . glucose blood test strip Check blood sugar 3 times a day DX CODE: 250.0  . INS SYRINGE/NEEDLE 1CC/28G 28G X 1/2" 1 ML MISC 1 each by Does not apply route as needed. To test glucose levels daily.  . Insulin Aspart Prot & Aspart (NOVOLOG 70/30 MIX) (70-30) 100 UNIT/ML Pen 44 units before breakfast,  50 units before dinner.  NEEDS 12 PENS PER MONTH  . Insulin Pen Needle (B-D ULTRAFINE III SHORT PEN) 31G X 8 MM MISC USE AS DIRECTED WITH NOVOLOG FLEXPEN  . isosorbide mononitrate (IMDUR) 30 MG 24 hr tablet Take 30 mg by mouth daily.  . Lancets 30G  MISC Check blood glucose TID. Dx 250.00  . metoprolol (LOPRESSOR) 50 MG tablet Take 0.5 tablets (25 mg total) by mouth 2 (two) times daily.  [name] olmesartan (BENICAR) 20 MG tablet Take 1 tablet (20 mg total) by mouth daily.  [name] PARoxetine (PAXIL) 10 MG tablet Take 1 tablet (10 mg total) by mouth daily.  . pregabalin (LYRICA) 100 MG capsule Take 1 capsule (100 mg total) by mouth 3 (three) times daily.  . TRUETEST TEST test strip CHECK BLOOD GLUCOSE THREE TIMES DAILY  . [DISCONTINUED] metoprolol tartrate (LOPRESSOR) 25 MG tablet TAKE ONE TABLET BY MOUTH TWICE DAILY  . [DISCONTINUED] Phenylephrine-DM-GG-APAP 5-10-200-325 MG TABS Per bottle directions as needed

## 2014-02-20 NOTE — Patient Instructions (Signed)
Take combivent 4 times a day We will arrange a chest x-ray in 4 months and will see you after that If someone tells you that you need to have fluid drawn off of your lungs then you need to see me first We will see you back in February 2016

## 2014-02-20 NOTE — Assessment & Plan Note (Signed)
This as well as the mediastinal lymphadenopathy have been stable on her most recent CT scans. No further imaging needed at this point.

## 2014-02-20 NOTE — Assessment & Plan Note (Signed)
Mild and multifactorial and mostly due to deconditioning and obesity. I agree with using Combivent 4 times a day as sometimes airflow obstruction can be seen with sarcoid. We ordered pulmonary function tests but she was unable to adequately complete the study. I do not hear wheezing today.

## 2014-02-20 NOTE — Assessment & Plan Note (Addendum)
Fortunately, this problem has not recurred. Her September 2015 showed near-complete resolution of the pleural effusion.  See discussions from my previous notes. She has had 2 separate pleural effusions drawn off in the last 18 months which showed 100% lymphocytes. 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 However, considering the fact that it has not recurred and the only way to effectively treat it would be chronic prednisone we're going to elect to observe alone at this point. Prednisone would only make her blood sugar worse.  Plan: -If she has recurrence of the pleural effusion then I want her sent to me rather than anyone else so that I can perform the thoracentesis -If this happens I will send the fluid for flow cytometry to evaluate for lymphoma, which I doubt. - Followup 4 months with a chest x-ray

## 2014-02-21 DIAGNOSIS — I209 Angina pectoris, unspecified: Secondary | ICD-10-CM | POA: Diagnosis not present

## 2014-02-21 DIAGNOSIS — I10 Essential (primary) hypertension: Secondary | ICD-10-CM | POA: Diagnosis not present

## 2014-02-21 DIAGNOSIS — I2510 Atherosclerotic heart disease of native coronary artery without angina pectoris: Secondary | ICD-10-CM | POA: Diagnosis not present

## 2014-02-21 DIAGNOSIS — R0602 Shortness of breath: Secondary | ICD-10-CM | POA: Diagnosis not present

## 2014-02-23 ENCOUNTER — Ambulatory Visit (INDEPENDENT_AMBULATORY_CARE_PROVIDER_SITE_OTHER): Payer: Medicare Other | Admitting: Internal Medicine

## 2014-02-23 ENCOUNTER — Encounter: Payer: Self-pay | Admitting: Internal Medicine

## 2014-02-23 VITALS — BP 148/68 | HR 64 | Temp 97.7°F | Resp 16 | Ht 63.0 in | Wt 189.2 lb

## 2014-02-23 DIAGNOSIS — E785 Hyperlipidemia, unspecified: Secondary | ICD-10-CM

## 2014-02-23 DIAGNOSIS — I2510 Atherosclerotic heart disease of native coronary artery without angina pectoris: Secondary | ICD-10-CM

## 2014-02-23 DIAGNOSIS — E1321 Other specified diabetes mellitus with diabetic nephropathy: Secondary | ICD-10-CM

## 2014-02-23 DIAGNOSIS — I70269 Atherosclerosis of native arteries of extremities with gangrene, unspecified extremity: Secondary | ICD-10-CM | POA: Diagnosis not present

## 2014-02-23 DIAGNOSIS — Z79899 Other long term (current) drug therapy: Secondary | ICD-10-CM | POA: Diagnosis not present

## 2014-02-23 DIAGNOSIS — K760 Fatty (change of) liver, not elsewhere classified: Secondary | ICD-10-CM | POA: Diagnosis not present

## 2014-02-23 DIAGNOSIS — E1165 Type 2 diabetes mellitus with hyperglycemia: Secondary | ICD-10-CM | POA: Diagnosis not present

## 2014-02-23 DIAGNOSIS — E782 Mixed hyperlipidemia: Secondary | ICD-10-CM | POA: Insufficient documentation

## 2014-02-23 DIAGNOSIS — E1129 Type 2 diabetes mellitus with other diabetic kidney complication: Secondary | ICD-10-CM

## 2014-02-23 DIAGNOSIS — E1351 Other specified diabetes mellitus with diabetic peripheral angiopathy without gangrene: Secondary | ICD-10-CM

## 2014-02-23 DIAGNOSIS — IMO0002 Reserved for concepts with insufficient information to code with codable children: Secondary | ICD-10-CM

## 2014-02-23 DIAGNOSIS — I255 Ischemic cardiomyopathy: Secondary | ICD-10-CM | POA: Diagnosis not present

## 2014-02-23 HISTORY — DX: Hyperlipidemia, unspecified: E78.5

## 2014-02-23 LAB — COMPREHENSIVE METABOLIC PANEL
ALBUMIN: 3.2 g/dL — AB (ref 3.5–5.2)
ALK PHOS: 53 U/L (ref 39–117)
ALT: 19 U/L (ref 0–35)
AST: 23 U/L (ref 0–37)
BUN: 22 mg/dL (ref 6–23)
CO2: 25 mEq/L (ref 19–32)
CREATININE: 1.2 mg/dL (ref 0.4–1.2)
Calcium: 9.5 mg/dL (ref 8.4–10.5)
Chloride: 103 mEq/L (ref 96–112)
GFR: 59.35 mL/min — ABNORMAL LOW (ref >60.00)
GLUCOSE: 167 mg/dL — AB (ref 70–99)
POTASSIUM: 4.4 meq/L (ref 3.5–5.1)
Sodium: 136 mEq/L (ref 135–145)
Total Bilirubin: 0.8 mg/dL (ref 0.2–1.2)
Total Protein: 7.5 g/dL (ref 6.0–8.3)

## 2014-02-23 LAB — LIPID PANEL
CHOLESTEROL: 159 mg/dL (ref 0–200)
HDL: 27.9 mg/dL — ABNORMAL LOW (ref >39.00)
LDL CALC: 98 mg/dL (ref 0–99)
NonHDL: 131.1
Total CHOL/HDL Ratio: 6
Triglycerides: 165 mg/dL — ABNORMAL HIGH (ref 0.0–149.0)
VLDL: 33 mg/dL (ref 0.0–40.0)

## 2014-02-23 LAB — MICROALBUMIN / CREATININE URINE RATIO
CREATININE, U: 110.9 mg/dL
MICROALB UR: 89.1 mg/dL — AB (ref 0.0–1.9)
Microalb Creat Ratio: 80.3 mg/g — ABNORMAL HIGH (ref 0.0–30.0)

## 2014-02-23 LAB — HEMOGLOBIN A1C: HEMOGLOBIN A1C: 9.1 % — AB (ref 4.6–6.5)

## 2014-02-23 MED ORDER — LOSARTAN POTASSIUM 50 MG PO TABS: 50.0000 mg | ORAL_TABLET | Freq: Every day | ORAL | Status: DC

## 2014-02-23 MED ORDER — HYDROCOD POLST-CHLORPHEN POLST 10-8 MG/5ML PO LQCR: 5.0000 mL | Freq: Every evening | ORAL | Status: DC | PRN

## 2014-02-23 MED ORDER — ATORVASTATIN CALCIUM 80 MG PO TABS: 80.0000 mg | ORAL_TABLET | Freq: Every day | ORAL | Status: DC

## 2014-02-23 NOTE — Patient Instructions (Addendum)
I am making the following changes to your medications with your next refill:   Crestor: changing to atorvastatin   Benicar: changing to losartan    Don't skip meals ! Buy the Atkins snack size protein bars at [employer]  In the diet aisle in front of pharmacy, or walnuts or other nuts ,  Cheese,  [place]    Your night time cough may be due to Post nasal drip.  PLEASE try taking generic Benadryl (dipenhydramine) 25 mg in the evening to help dry up your nasal drainage

## 2014-02-23 NOTE — Progress Notes (Signed)
Patient ID: Pamela Collier, female   DOB: [birth year], 73 y.o.   MRN: [id number]   Patient Active Problem List   Diagnosis Date Noted  . Hyperlipidemia LDL goal <100 02/23/2014  . Long-term use of high-risk medication 02/23/2014  . OP (osteoporosis) 08/17/2013  . Dermatophytic onychia 08/17/2013  . Arthritis 08/05/2013  . Unsteady gait 03/24/2013  . History of colonic polyps 02/20/2013  . Need for prophylactic vaccination and inoculation against influenza 02/03/2013  . OSA (obstructive sleep apnea) 12/07/2012  . 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 Dysphagia 12/07/2012  . Pleural effusion 10/25/2012  . Cough 10/25/2012  . Hospital discharge follow-up 09/17/2012  . SOB (shortness of breath) 05/27/2012  . Pulmonary nodule seen on imaging study 05/20/2012  . 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 Cardiomyopathy, ischemic   . Hepatic steatosis   . Osteoporosis, post-menopausal   . 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 DM type 2, uncontrolled, with renal complications 18/56/3149  . Double vessel coronary artery disease 12/14/2011    Subjective:  CC:   Chief Complaint  Patient presents with  . Follow-up  . Diabetes    HPI:   Pamela Collier is a 73 y.o. female who presents for Follow up on multiple chronic conditions including DM  Type 2 with complications.    Has been having a nocturnal cough for the past week ,  Wants a refill on tussionex,  Using it at night to suppress  Cough so she can tolelratae her CPAP   She is having difficutly affording current medications and is requesting alternatives  DM :  Sugars have improved since attending the [place] [facility] Last nights sugars was 104 .  forgets to eat when starts crocheting   PAD:    History of  right 2nd toe amputation 3 yrs ago secondary to t o gangrene     Had balloon angioplasty of left leg last month for claudicaiton,  Had a  mild reaction to the contrast which  required medication for itching but denies andy history of swelling . Her symptoms of claudication have improved but not resolved ,  Pulses are faint     Sees [facility] regularly for management of pressures ulcers and onychomycosis   Stress test  2 weeks ago by [facility]   Results are still  pending per patient.    Past Medical History  Diagnosis Date  . Diabetes mellitus   . 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 Cardiomyopathy, ischemic Jan 2013    EF 35 to 40% [facility])  . Hepatic steatosis     by CT abd pelvis  . Osteoporosis, post-menopausal   . 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 Abdominal hysterectomy      at ge 39. secondary to bleeding  . Toe amputation  Sept 2012    Right 2nd toe, [facility]  . Ptca  August 2012    Right Posterior tibial artery , [facility]       The following portions of the patient's history were reviewed and updated as appropriate: Allergies, current medications, and problem list.    Review of Systems:   Patient denies headache, fevers,  malaise, unintentional weight loss, skin rash, eye pain, sinus congestion and sinus pain, sore throat, dysphagia,  hemoptysis , cough, dyspnea, wheezing, chest pain, palpitations, orthopnea, edema, abdominal pain, nausea, melena, diarrhea, constipation, flank pain, dysuria, hematuria, urinary  Frequency, nocturia, numbness, tingling, seizures,  Focal weakness, Loss of consciousness,  Tremor, insomnia, depression, anxiety, and suicidal ideation.     History   Social History  . Marital Status: Married    Spouse Name: N/A    Number of Children: N/A  . Years of Education: N/A   Occupational History  . Not on file.   Social History  Main Topics  . Smoking status: Never Smoker   . Smokeless tobacco: Never Used  . Alcohol Use: No  . Drug Use: No  . Sexual Activity: Yes   Other Topics Concern  . Not on file   Social History Narrative  . No narrative on file    Objective:  Filed Vitals:   02/23/14 0957  BP: 148/68  Pulse: 64  Temp: 97.7 F (36.5 C)  Resp: 16     General appearance: alert, cooperative and appears stated age Ears: normal TM's and external ear canals both ears Throat: lips, mucosa, and tongue normal; teeth and gums normal Neck: no adenopathy, no carotid bruit, supple, symmetrical, trachea midline and thyroid not enlarged, symmetric, no tenderness/mass/nodules Back: symmetric, no curvature. ROM normal. No CVA tenderness. Lungs: clear to auscultation bilaterally Heart: regular rate and rhythm, S1, S2 normal, no murmur, click, rub or gallop Abdomen: soft, non-tender; bowel sounds normal; no masses,  no organomegaly Pulses: 2+ and symmetric Skin: Skin color, texture, turgor normal. No rashes or lesions Lymph nodes: Cervical, supraclavicular, and axillary nodes normal.  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  Stress test 2 weeks ago results pending. Continue ASA, lipitor, beta blocker ARB and Imdur  DM type 2, uncontrolled, with renal complications Uncontrolled with minimal improvement in A1c since last time despite  completing the diabetes education at [facility].    I have addressed her current doses of insulin and increased evening dose of mixed insulin  to 53 units.  Advised her to continue  44  Units  the AM .  Return with blood sugars.  Foot exam done today.  She has a history of neuropathy with evidence of a resolving pressure ulcer on the medial side of her great toe. She has seen podiatry,  Continue ARB, statin, ASA and return for fasting labs ASAP  Lab Results  Component Value Date   HGBA1C 9.1* 02/23/2014   Lab Results  Component Value Date   MICROALBUR 89.1* 02/23/2014   Lab Results  Component Value Date   CREATININE 1.2 02/23/2014       Hyperlipidemia LDL goal <100 Changing crestor to atorvastatin for cost savings.  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   A total of 40 minutes was spent with patient more than half of which was spent in counseling patient on the above mentioned issues , reviewing and explaining recent labs and imaging studies done, and coordination of care.   Updated Medication List Outpatient Encounter Prescriptions as of 02/23/2014  Medication Sig  . albuterol (PROVENTIL) (2.5 MG/3ML) 0.083% nebulizer solution Take 3 mLs (2.5 mg total) by nebulization every 6 (six) hours as needed for wheezing or shortness of breath.  [name] aspirin 81 MG EC tablet Take 1 tablet (81 mg total) by mouth daily. Swallow whole.  . Blood Glucose Monitoring Suppl (TRUERESULT BLOOD GLUCOSE) W/DEVICE KIT 1 kit by Does not apply route 3 (three) times daily.  . COMBIVENT RESPIMAT 20-100 MCG/ACT AERS respimat INHALE ONE PUFF EVERY 6 HOURS  . fluticasone (FLONASE) 50 MCG/ACT nasal spray Place 2 sprays into the nose daily.  . furosemide (LASIX) 20 MG  tablet Take 20 mg by mouth daily.   [name] glucose blood test strip Check blood sugar 3 times a day DX CODE: 250.0  . INS SYRINGE/NEEDLE 1CC/28G 28G X 1/2" 1 ML MISC 1 each by Does not apply route as needed. To test glucose levels daily.  . Insulin Aspart Prot & Aspart (NOVOLOG 70/30 MIX) (70-30) 100 UNIT/ML Pen 44 units before breakfast,  50 units before dinner.  NEEDS 12 PENS PER MONTH  . Insulin Pen Needle (B-D ULTRAFINE III SHORT PEN) 31G X 8 MM MISC USE AS DIRECTED WITH NOVOLOG FLEXPEN  . isosorbide mononitrate (IMDUR) 30 MG 24 hr tablet Take 30 mg by mouth daily.  . Lancets 30G MISC Check blood glucose TID. Dx 250.00  . metoprolol (LOPRESSOR) 50 MG tablet Take 0.5 tablets (25 mg total) by mouth 2 (two) times daily.  [name] PARoxetine (PAXIL) 10 MG tablet Take 1 tablet (10 mg total) by mouth daily.  . pregabalin (LYRICA) 100 MG capsule Take 1 capsule (100 mg total) by mouth 3 (three) times daily.  . TRUETEST TEST test strip CHECK BLOOD GLUCOSE THREE TIMES DAILY  . [DISCONTINUED] CRESTOR 10 MG tablet TAKE ONE TABLET BY MOUTH ONCE DAILY  . [DISCONTINUED] olmesartan (BENICAR) 20 MG tablet Take 1 tablet (20 mg total) by mouth daily.  [name] ALPRAZolam (XANAX) 0.5 MG tablet Take 1 tablet (0.5 mg total) by mouth daily as needed for sleep or anxiety.  [name] atorvastatin (LIPITOR) 80 MG tablet Take 1 tablet (80 mg total) by mouth daily.  . chlorpheniramine-HYDROcodone (TUSSIONEX) 10-8 MG/5ML LQCR Take 5 mLs by mouth at bedtime as needed for cough.  . losartan (COZAAR) 50 MG tablet Take 1 tablet (50 mg total) by mouth daily.  . [DISCONTINUED] furosemide (LASIX) 20 MG tablet TAKE ONE TABLET BY MOUTH EVERY DAY     Orders Placed This Encounter  Procedures  . Comprehensive metabolic panel  . Hemoglobin A1c  . Microalbumin / creatinine urine ratio  . Lipid panel    No Follow-up on file.

## 2014-02-23 NOTE — Progress Notes (Signed)
Pre-visit discussion using our clinic review tool. No additional management support is needed unless otherwise documented below in the visit note.  

## 2014-02-23 NOTE — Assessment & Plan Note (Signed)
Patient takes insulin andm statins

## 2014-02-24 ENCOUNTER — Encounter: Payer: Self-pay | Admitting: Internal Medicine

## 2014-02-26 NOTE — Assessment & Plan Note (Signed)
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

## 2014-02-26 NOTE — Assessment & Plan Note (Addendum)
Uncontrolled with minimal improvement in A1c since last time despite completing the diabetes education at [facility].    I have addressed her current doses of insulin and increased evening dose of mixed insulin  to 53 units.  Advised her to continue  44  Units  the AM .  Return with blood sugars.  Foot exam done today.  She has a history of neuropathy with evidence of a resolving pressure ulcer on the medial side of her great toe. She has seen podiatry,  Continue ARB, statin, ASA and return for fasting labs ASAP  Lab Results  Component Value Date   HGBA1C 9.1* 02/23/2014   Lab Results  Component Value Date   MICROALBUR 89.1* 02/23/2014   Lab Results  Component Value Date   CREATININE 1.2 02/23/2014

## 2014-02-26 NOTE — Assessment & Plan Note (Signed)
History of PTCA balloon angioplasty of R posterior tibial artery [facility]) August 2012 and more recently left leg  History of gangrene Right toe 2012

## 2014-02-26 NOTE — Assessment & Plan Note (Addendum)
Managed by [facility].  Stress test 2 weeks ago results pending. Continue ASA, lipitor, beta blocker ARB and Imdur

## 2014-02-26 NOTE — Assessment & Plan Note (Signed)
Changing olmesartan to losartan for cost savings.

## 2014-02-26 NOTE — Assessment & Plan Note (Signed)
Changing crestor to atorvastatin for cost savings.  Lab Results  Component Value Date   CHOL 159 02/23/2014   HDL 27.90* 02/23/2014   LDLCALC 98 02/23/2014   LDLDIRECT 98.0 07/01/2013   TRIG 165.0* 02/23/2014   CHOLHDL 6 02/23/2014

## 2014-03-07 ENCOUNTER — Telehealth: Payer: Self-pay | Admitting: Internal Medicine

## 2014-03-07 NOTE — Telephone Encounter (Signed)
Patient called confused to medication changes patient was understanding losartan was to replace Crestor advised patient that Losartan is BP medication as is top replace Benicar. Also advised patient when she finishes crestor changing to atorvastatin.

## 2014-03-14 ENCOUNTER — Telehealth: Payer: Self-pay | Admitting: Internal Medicine

## 2014-03-14 NOTE — Telephone Encounter (Signed)
In results folder. 

## 2014-03-14 NOTE — Telephone Encounter (Signed)
Pamela Collier came and dropped off her blood sugar numbers for the month of October. I've placed it in Dr. Lupita Dawn box. Thank you.

## 2014-03-17 ENCOUNTER — Telehealth: Payer: Self-pay | Admitting: Internal Medicine

## 2014-03-17 DIAGNOSIS — IMO0002 Reserved for concepts with insufficient information to code with codable children: Secondary | ICD-10-CM

## 2014-03-17 DIAGNOSIS — E1165 Type 2 diabetes mellitus with hyperglycemia: Principal | ICD-10-CM

## 2014-03-17 DIAGNOSIS — E1129 Type 2 diabetes mellitus with other diabetic kidney complication: Secondary | ICD-10-CM

## 2014-03-17 NOTE — Telephone Encounter (Signed)
Log of sugars from October reviewed and are still not controlled.   Please  increase  evening dose of mixed insulin to 56 units and increase morning dose to 47 units

## 2014-03-17 NOTE — Telephone Encounter (Signed)
Tried to call patient no voicemail

## 2014-03-17 NOTE — Telephone Encounter (Signed)
Reached patient in [place] and notified patient of new dosing of mixed insulin patient voiced understanding and wrote down new dosage.

## 2014-03-17 NOTE — Assessment & Plan Note (Signed)
Log of sugars from October reviewed and are still not controlled.   Patient asked to increase  evening dose of mixed insulin to 56 units and increase morning dose to 47 units

## 2014-03-27 ENCOUNTER — Telehealth: Payer: Self-pay | Admitting: *Deleted

## 2014-03-27 MED ORDER — ROSUVASTATIN CALCIUM 20 MG PO TABS: 20.0000 mg | ORAL_TABLET | Freq: Every day | ORAL | Status: DC

## 2014-03-27 NOTE — Telephone Encounter (Signed)
Pt called states since she started taking Atorvastatin she has began having abd pain and cramping.  She further states she stopped taking the medication and is wanting to start back on Crestor.  Please advise

## 2014-03-27 NOTE — Telephone Encounter (Signed)
Spoke with pt advised Rx sent to pharmacy. 

## 2014-03-27 NOTE — Telephone Encounter (Signed)
crestor rx sent to [facility]  Stop atorvasattin

## 2014-03-28 ENCOUNTER — Emergency Department: Payer: Self-pay | Admitting: Emergency Medicine

## 2014-03-28 DIAGNOSIS — R1032 Left lower quadrant pain: Secondary | ICD-10-CM | POA: Diagnosis not present

## 2014-03-28 DIAGNOSIS — J90 Pleural effusion, not elsewhere classified: Secondary | ICD-10-CM | POA: Diagnosis not present

## 2014-03-28 DIAGNOSIS — R51 Headache: Secondary | ICD-10-CM | POA: Diagnosis not present

## 2014-03-28 DIAGNOSIS — K228 Other specified diseases of esophagus: Secondary | ICD-10-CM | POA: Diagnosis not present

## 2014-03-28 DIAGNOSIS — R0602 Shortness of breath: Secondary | ICD-10-CM | POA: Diagnosis not present

## 2014-03-28 DIAGNOSIS — I10 Essential (primary) hypertension: Secondary | ICD-10-CM | POA: Diagnosis not present

## 2014-03-28 DIAGNOSIS — R079 Chest pain, unspecified: Secondary | ICD-10-CM | POA: Diagnosis not present

## 2014-03-28 DIAGNOSIS — R109 Unspecified abdominal pain: Secondary | ICD-10-CM | POA: Diagnosis not present

## 2014-03-28 DIAGNOSIS — E119 Type 2 diabetes mellitus without complications: Secondary | ICD-10-CM | POA: Diagnosis not present

## 2014-03-28 LAB — URINALYSIS, COMPLETE
BACTERIA: NONE SEEN
Bilirubin,UR: NEGATIVE
Blood: NEGATIVE
Glucose,UR: NEGATIVE mg/dL (ref 0–75)
Ketone: NEGATIVE
LEUKOCYTE ESTERASE: NEGATIVE
NITRITE: NEGATIVE
Ph: 6 (ref 4.5–8.0)
RBC,UR: <1 /HPF (ref 0–5)
SPECIFIC GRAVITY: 1.011 (ref 1.003–1.030)
Squamous Epithelial: <1
WBC UR: 1 /HPF (ref 0–5)

## 2014-03-28 LAB — HEPATIC FUNCTION PANEL A (ARMC)
ALBUMIN: 3.2 g/dL — AB (ref 3.4–5.0)
ALT: 31 U/L
AST: 23 U/L (ref 15–37)
Alkaline Phosphatase: 78 U/L
BILIRUBIN TOTAL: 0.5 mg/dL (ref 0.2–1.0)
Bilirubin, Direct: 0.1 mg/dL (ref 0.0–0.2)
TOTAL PROTEIN: 7.4 g/dL (ref 6.4–8.2)

## 2014-03-28 LAB — BASIC METABOLIC PANEL
Anion Gap: 8 (ref 7–16)
BUN: 20 mg/dL — ABNORMAL HIGH (ref 7–18)
CO2: 26 mmol/L (ref 21–32)
CREATININE: 1.11 mg/dL (ref 0.60–1.30)
Calcium, Total: 8.8 mg/dL (ref 8.5–10.1)
Chloride: 106 mmol/L (ref 98–107)
EGFR (Non-African Amer.): 51 — ABNORMAL LOW
Glucose: 107 mg/dL — ABNORMAL HIGH (ref 65–99)
Osmolality: 282 (ref 275–301)
POTASSIUM: 4.1 mmol/L (ref 3.5–5.1)
Sodium: 140 mmol/L (ref 136–145)

## 2014-03-28 LAB — CBC
HCT: 36.5 % (ref 35.0–47.0)
HGB: 12.1 g/dL (ref 12.0–16.0)
MCH: 30.7 pg (ref 26.0–34.0)
MCHC: 33.1 g/dL (ref 32.0–36.0)
MCV: 93 fL (ref 80–100)
Platelet: 271 10*3/uL (ref 150–440)
RBC: 3.93 10*6/uL (ref 3.80–5.20)
RDW: 13.7 % (ref 11.5–14.5)
WBC: 7.8 10*3/uL (ref 3.6–11.0)

## 2014-03-28 LAB — TROPONIN I
Troponin-I: <0.02 ng/mL
Troponin-I: 0.02 ng/mL

## 2014-04-03 ENCOUNTER — Encounter: Payer: Self-pay | Admitting: Internal Medicine

## 2014-04-03 ENCOUNTER — Ambulatory Visit (INDEPENDENT_AMBULATORY_CARE_PROVIDER_SITE_OTHER): Payer: Medicare Other | Admitting: Internal Medicine

## 2014-04-03 ENCOUNTER — Other Ambulatory Visit: Payer: Self-pay | Admitting: Pulmonary Disease

## 2014-04-03 ENCOUNTER — Telehealth: Payer: Self-pay | Admitting: Pulmonary Disease

## 2014-04-03 VITALS — BP 160/64 | HR 68 | Temp 97.1°F | Ht 63.0 in | Wt 189.0 lb

## 2014-04-03 DIAGNOSIS — I255 Ischemic cardiomyopathy: Secondary | ICD-10-CM

## 2014-04-03 DIAGNOSIS — J90 Pleural effusion, not elsewhere classified: Secondary | ICD-10-CM

## 2014-04-03 DIAGNOSIS — E1351 Other specified diabetes mellitus with diabetic peripheral angiopathy without gangrene: Secondary | ICD-10-CM | POA: Diagnosis not present

## 2014-04-03 DIAGNOSIS — I10 Essential (primary) hypertension: Secondary | ICD-10-CM

## 2014-04-03 DIAGNOSIS — M545 Low back pain, unspecified: Secondary | ICD-10-CM

## 2014-04-03 DIAGNOSIS — M5440 Lumbago with sciatica, unspecified side: Secondary | ICD-10-CM

## 2014-04-03 DIAGNOSIS — R1030 Lower abdominal pain, unspecified: Secondary | ICD-10-CM

## 2014-04-03 MED ORDER — HYDROCODONE-ACETAMINOPHEN 10-325 MG PO TABS: 1.0000 | ORAL_TABLET | Freq: Four times a day (QID) | ORAL | Status: DC | PRN

## 2014-04-03 MED ORDER — LACTULOSE 20 GM/30ML PO SOLN: ORAL | Status: DC

## 2014-04-03 MED ORDER — PREDNISONE (PAK) 10 MG PO TABS: ORAL_TABLET | ORAL | Status: DC

## 2014-04-03 NOTE — Assessment & Plan Note (Signed)
Now with moderate right pleural effusion.  Dr. Lake Bells has been following her for this, see his note below: "See discussions from my previous notes. She has had 2 separate pleural effusions drawn off in the last 18 months which showed 100% lymphocytes. 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 However, considering the fact that it has not recurred and the only way to effectively treat it would be chronic prednisone we're going to elect to observe alone at this point. Prednisone would only make her blood sugar worse. Plan: -If she has recurrence of the pleural effusion then I want her sent to me rather than anyone else so that I can perform the thoracentesis -If this happens I will send the fluid for flow cytometry to evaluate for lymphoma, which I doubt."  I have spoken to Dr. Lake Bells and he has agreed to seen patient at [facility] to have a thoracentesis on 04/07/14 at 8am. Patient in agreement with above plan.  For her back and stomach pain, the visit was turned over to Dr. Derrel Nip (see her note for further details).

## 2014-04-03 NOTE — Telephone Encounter (Signed)
Message not needed. I gave appt with M[facility].

## 2014-04-03 NOTE — Progress Notes (Signed)
MRN# [id number] Pamela Collier 1941/01/01   CC:"I was told to come here" Chief Complaint  Patient presents with  . Acute Visit     pt c/o DOE, some cough, no chest tightness or wheezing. She was seen at [facility] 03/28/14 for pleural effusion.      Brief History: Synopsis: This is a 73 year old female first seen by Dr. Melvyn Novas in the [facility] pulmonary office in January 2014 for pulmonary nodule and some shortness of breath. An ACE inhibitor was held during that visit and followup imaging was planned.  02/20/2014 ROV > [name] has been doing OK and says that she still has some dyspnea on exertion. No chest pain, just feels fatigued. She wheezes more when she has a cold or a runny nose. Lately she caught a cold and took cough medicine which helped. She was coughing up white phlegm and now the wheezing. She has not had the fluid taken off since the last episode in June 2015. She has not had the fluid removed since.     Events since last clinic visit:   Patient was seen in the [facility] ED on 11/17 for stomach and abdominal pain, at that visit she was noted to have a right pleural effusion and advised to follow up with her pulmonologist. Per review of records the RLL pleural effusion is moderately increased and associated with some atelectasis. Patient's main complaint today is abdominal pain and back pain, along with some mild sob. No fever, no cough.     PMHX:   Past Medical History  Diagnosis Date  . Diabetes mellitus   . 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 Cardiomyopathy, ischemic Jan 2013    EF 35 to 40% [facility])  . Hepatic steatosis     by CT abd pelvis  . Osteoporosis, post-menopausal   . 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 Abdominal hysterectomy      at ge 39. secondary to bleeding  . Toe amputation  Sept 2012    Right 2nd toe, Fowler  . Ptca  August 2012    Right Posterior tibial artery , Dew   Family Hx:  Family History  Problem Relation Age of Onset  . Heart disease Father   . Heart disease Sister   . Heart disease Brother   . Asthma Grandchild   . Breast cancer Maternal Aunt    Social Hx:   History  Substance Use Topics  . Smoking status: Never Smoker   . Smokeless tobacco: Never Used  . Alcohol Use: No   Medication:   Current Outpatient Rx  Name  Route  Sig  Dispense  Refill  . albuterol (PROVENTIL) (2.5 MG/3ML) 0.083% nebulizer solution   Nebulization   Take 3 mLs (2.5 mg total) by nebulization every 6 (six) hours as needed for wheezing or shortness of breath.   150 mL   1   . ALPRAZolam (XANAX) 0.5 MG tablet   Oral   Take 1 tablet (0.5 mg total) by mouth daily as needed for sleep or anxiety.   30 tablet   3   . aspirin 81 MG EC tablet   Oral   Take 1 tablet (81 mg total) by  mouth daily. Swallow whole.   30 tablet   12   . Blood Glucose Monitoring Suppl (TRUERESULT BLOOD GLUCOSE) W/DEVICE KIT   Does not apply   1 kit by Does not apply route 3 (three) times daily.   1 each   0     DX : 250.00   . chlorpheniramine-HYDROcodone (TUSSIONEX) 10-8 MG/5ML LQCR   Oral   Take 5 mLs by mouth at bedtime as needed for cough.   180 mL   0   . COMBIVENT RESPIMAT 20-100 MCG/ACT AERS respimat      INHALE ONE PUFF EVERY 6 HOURS   4 g   1   . fluticasone (FLONASE) 50 MCG/ACT nasal spray   Nasal   Place 2 sprays into the nose daily.   16 g   6   . furosemide (LASIX) 20 MG tablet   Oral   Take 20 mg by mouth daily.          [name] glucose blood test strip      Check blood sugar 3 times a day DX CODE: 250.0   300 each   11     Test strips for True Results meter.   [name] HYDROcodone-acetaminophen (NORCO/VICODIN) 5-325 MG per tablet   Oral    Take 1 tablet by mouth every 6 (six) hours as needed for moderate pain.         . INS SYRINGE/NEEDLE 1CC/28G 28G X 1/2" 1 ML MISC   Does not apply   1 each by Does not apply route as needed. To test glucose levels daily.         . Insulin Aspart Prot & Aspart (NOVOLOG 70/30 MIX) (70-30) 100 UNIT/ML Pen      44 units before breakfast,  50 units before dinner.  NEEDS 12 PENS PER MONTH   30 mL   11   . Insulin Pen Needle (B-D ULTRAFINE III SHORT PEN) 31G X 8 MM MISC      USE AS DIRECTED WITH NOVOLOG FLEXPEN   100 each   3   . isosorbide mononitrate (IMDUR) 30 MG 24 hr tablet   Oral   Take 30 mg by mouth daily.         . Lancets 30G MISC      Check blood glucose TID. Dx 250.00   100 each   5   . losartan (COZAAR) 50 MG tablet   Oral   Take 1 tablet (50 mg total) by mouth daily.   90 tablet   3   . metoprolol (LOPRESSOR) 50 MG tablet   Oral   Take 0.5 tablets (25 mg total) by mouth 2 (two) times daily.   30 tablet   5   . PARoxetine (PAXIL) 10 MG tablet   Oral   Take 1 tablet (10 mg total) by mouth daily.   30 tablet   2   . pregabalin (LYRICA) 100 MG capsule   Oral   Take 1 capsule (100 mg total) by mouth 3 (three) times daily.   270 capsule   3   . rosuvastatin (CRESTOR) 20 MG tablet   Oral   Take 1 tablet (20 mg total) by mouth daily.   30 tablet   11   . TRUETEST TEST test strip      CHECK BLOOD GLUCOSE THREE TIMES DAILY   100 each   5     Dx code 250.00      Review of Systems: Gen:  Denies  fever, sweats, chills HEENT: Denies blurred vision, double vision, ear pain, eye pain, hearing loss, nose bleeds, sore throat Cvc:  No dizziness, chest pain or heaviness Resp:  shortness of breath Gi: admits stomach pain,  Gu:  bladder incontinence,  Ext:   No Joint pain, stiffness or swelling Skin: No skin rash, easy bruising or bleeding or hives Endoc:  No polyuria, polydipsia , polyphagia or weight change Psych: No depression, insomnia or  hallucinations  Other:  All other systems negative  Allergies:  Gabapentin and Sulfa antibiotics  Physical Examination:  VS: BP 160/64 mmHg  Pulse 68  Temp(Src) 97.1 F (36.2 C) (Oral)  Ht 5' 3"  (1.6 m)  Wt 189 lb (85.73 kg)  BMI 33.49 kg/m2  SpO2 97%  General Appearance: No distress  HEENT: PERRLA, EOM intact, no ptosis, no other lesions noticed Pulmonary:Exam: normal breath sounds., diaphragmatic excursion normal.No wheezing, No rales   Cardiovascular:@ Exam:  Normal S1,S2.  No m/r/g.     Abdomen:Exam: Benign, Soft, non-tender, No masses  Skin:   warm, no rashes, no ecchymosis  Extremities: normal, no cyanosis, clubbing, no edema, warm with normal capillary refill.   Labs results:  BMP Lab Results  Component Value Date   NA 136 02/23/2014   K 4.4 02/23/2014   CL 103 02/23/2014   CO2 25 02/23/2014   GLUCOSE 167* 02/23/2014   BUN 22 02/23/2014   CREATININE 1.2 02/23/2014     CBC CBC Latest Ref Rng 10/25/2012 06/16/2012 03/15/2012  WBC 4.5 - 10.5 K/uL 6.6 7.5 8.5  Hemoglobin 12.0 - 15.0 g/dL 11.6(L) 12.0 11.6(L)  Hematocrit 36.0 - 46.0 % 34.4(L) 35.8(L) 35.2(L)  Platelets 150.0 - 400.0 K/uL 310.0 297.0 341.0     Rad results:  (The following images and results were reviewed by Dr. Stevenson Clinch).  CT Abdomen and Pelvis (03/28/14) Moderate Right pleural effusion is partially visualized.  Bibasilar atelectasis sen dependently within the visualized lung bases. Small hiatal hernia present.  No bowel obstruction. Partially distended bladder. No Free air or fluid Prominent nodes measuring up to 1.1 cm in short axis adjacent to distal esophagus.    Assessment and Plan: Pleural effusion Now with moderate right pleural effusion.  Dr. Lake Bells has been following her for this, see his note below: "See discussions from my previous notes. She has had 2 separate pleural effusions drawn off in the last 18 months which showed 100% lymphocytes. I believe this is related to her known  diagnosis of sarcoidosis. However, considering the fact that it has not recurred and the only way to effectively treat it would be chronic prednisone we're going to elect to observe alone at this point. Prednisone would only make her blood sugar worse. Plan: -If she has recurrence of the pleural effusion then I want her sent to me rather than anyone else so that I can perform the thoracentesis -If this happens I will send the fluid for flow cytometry to evaluate for lymphoma, which I doubt."  I have spoken to Dr. Lake Bells and he has agreed to seen patient at [facility] to have a thoracentesis on 04/07/14 at 8am. Patient in agreement with above plan.  For her back and stomach pain, the visit was turned over to Dr. Derrel Nip (see her note for further details).      Updated Medication List Outpatient Encounter Prescriptions as of 04/03/2014  Medication Sig  . albuterol (PROVENTIL) (2.5 MG/3ML) 0.083% nebulizer solution Take 3 mLs (2.5 mg total) by nebulization every 6 (  six) hours as needed for wheezing or shortness of breath.  . ALPRAZolam (XANAX) 0.5 MG tablet Take 1 tablet (0.5 mg total) by mouth daily as needed for sleep or anxiety.  [name] aspirin 81 MG EC tablet Take 1 tablet (81 mg total) by mouth daily. Swallow whole.  . Blood Glucose Monitoring Suppl (TRUERESULT BLOOD GLUCOSE) W/DEVICE KIT 1 kit by Does not apply route 3 (three) times daily.  . chlorpheniramine-HYDROcodone (TUSSIONEX) 10-8 MG/5ML LQCR Take 5 mLs by mouth at bedtime as needed for cough.  . COMBIVENT RESPIMAT 20-100 MCG/ACT AERS respimat INHALE ONE PUFF EVERY 6 HOURS  . fluticasone (FLONASE) 50 MCG/ACT nasal spray Place 2 sprays into the nose daily.  . furosemide (LASIX) 20 MG tablet Take 20 mg by mouth daily.   [name] glucose blood test strip Check blood sugar 3 times a day DX CODE: 250.0  . INS SYRINGE/NEEDLE 1CC/28G 28G X 1/2" 1 ML MISC 1 each by Does not apply route as needed. To test glucose levels daily.  . Insulin  Aspart Prot & Aspart (NOVOLOG 70/30 MIX) (70-30) 100 UNIT/ML Pen 44 units before breakfast,  50 units before dinner.  NEEDS 12 PENS PER MONTH  . Insulin Pen Needle (B-D ULTRAFINE III SHORT PEN) 31G X 8 MM MISC USE AS DIRECTED WITH NOVOLOG FLEXPEN  . isosorbide mononitrate (IMDUR) 30 MG 24 hr tablet Take 30 mg by mouth daily.  . Lancets 30G MISC Check blood glucose TID. Dx 250.00  . losartan (COZAAR) 50 MG tablet Take 1 tablet (50 mg total) by mouth daily.  . metoprolol (LOPRESSOR) 50 MG tablet Take 0.5 tablets (25 mg total) by mouth 2 (two) times daily.  [name] PARoxetine (PAXIL) 10 MG tablet Take 1 tablet (10 mg total) by mouth daily.  . pregabalin (LYRICA) 100 MG capsule Take 1 capsule (100 mg total) by mouth 3 (three) times daily.  . rosuvastatin (CRESTOR) 20 MG tablet Take 1 tablet (20 mg total) by mouth daily.  . TRUETEST TEST test strip CHECK BLOOD GLUCOSE THREE TIMES DAILY  . [DISCONTINUED] HYDROcodone-acetaminophen (NORCO/VICODIN) 5-325 MG per tablet Take 1 tablet by mouth every 6 (six) hours as needed for moderate pain.    Orders for this visit: No orders of the defined types were placed in this encounter.    Thank  you for the visitation and for allowing  [facility] to assist in the care of your patient. Our recommendations are noted above.  Please contact us if we can be of further service.  Vilinda Boehringer, MD [facility] Office Number: [phone]

## 2014-04-03 NOTE — Patient Instructions (Addendum)
Your back pain may be from a fractured vertebra or from a bulging disk  I am making your pain  medicine stronger.  YOU CAN STILL TAKE IT EVERY  6  H OURS  IF NEEDED .  IT WILL CONSTIPATE YOU   When you are feeling better please go get the lumbar spine x rays at [facility] will need to take dulcolax or Sennakot a every night to prevent constipation   Use the lactulose every 3 days if needed for severe constipation   DO NOT START THE PREDNISONE UNTIL AFTER YOUR Friday PROCEDURE

## 2014-04-03 NOTE — Progress Notes (Signed)
Patient ID: Pamela Collier, female   DOB: 04/21/1941, 73 y.o.   MRN: [id number]   . Patient Active Problem List   Diagnosis Date Noted  . Low back pain with radiation 04/04/2014  . Abdominal pain, suprapubic 04/04/2014  . Hyperlipidemia LDL goal <100 02/23/2014  . Long-term use of high-risk medication 02/23/2014  . OP (osteoporosis) 08/17/2013  . Dermatophytic onychia 08/17/2013  . Arthritis 08/05/2013  . Unsteady gait 03/24/2013  . History of colonic polyps 02/20/2013  . Need for prophylactic vaccination and inoculation against influenza 02/03/2013  . OSA (obstructive sleep apnea) 12/07/2012  . 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 Dysphagia 12/07/2012  . Pleural effusion 10/25/2012  . Cough 10/25/2012  . Hospital discharge follow-up 09/17/2012  . SOB (shortness of breath) 05/27/2012  . Pulmonary nodule seen on imaging study 05/20/2012  . 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 Cardiomyopathy, ischemic   . Hepatic steatosis   . Osteoporosis, post-menopausal   . 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 DM type 2, uncontrolled, with renal complications 67/34/1937  . Double vessel coronary artery disease 12/14/2011    Subjective:  CC:   No chief complaint on file.   HPI:   Pamela Collier is a 73 y.o. female who presents for  Follow up on abdominal and back pain with recent ER evaluation.  patient reports new onset Back pain since last Tuesday ,  Severe and not preceded by any fall.  Was also having lower abdominal pain which did not improve with change from atorvastatin to crestor (per phone note request from Nov 16).  When she was evaluated in ED on Nov 17th the back pain was not addressed except with vicodin which has not touched the pain despite using it every 6 hours.  She is now constipated as well.  Underwent a CT abd and pelvis, which was negative for acute changes,  Diverticulitis,  Etc and  showed persistent right pleural effusion that is under ongoing evaluation and management by Pulmonology.    Past Medical History  Diagnosis Date  . Diabetes mellitus   . 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 Cardiomyopathy, ischemic Jan 2013    EF 35 to 40% [facility])  . Hepatic steatosis     by CT abd pelvis  . Osteoporosis, post-menopausal   . 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 Abdominal hysterectomy      at ge 39. secondary to bleeding  . Toe amputation  Sept 2012    Right 2nd toe, Fowler  . Ptca  August 2012    Right Posterior tibial artery , Dew       The following portions of the patient's history were reviewed and updated as appropriate: Allergies, current medications, and problem list.    Review of Systems:   Patient denies headache, fevers, malaise, unintentional weight loss, skin rash, eye pain, sinus congestion and sinus pain, sore throat, dysphagia,  hemoptysis , cough, dyspnea, wheezing, chest pain, palpitations, orthopnea, edema, abdominal pain, nausea, melena, diarrhea, constipation, flank pain, dysuria, hematuria, urinary  Frequency, nocturia, numbness, tingling, seizures,  Focal weakness, Loss of consciousness,  Tremor, insomnia, depression, anxiety, and suicidal ideation.  History   Social History  . Marital Status: Married    Spouse Name: N/A    Number of Children: N/A  . Years of Education: N/A   Occupational History  . Not on file.   Social History Main Topics  . Smoking status: Never Smoker   . Smokeless tobacco: Never Used  . Alcohol Use: No  . Drug Use: No  . Sexual Activity: Yes   Other Topics Concern  . Not on file   Social History Narrative    Objective:  There were no  vitals filed for this visit. patient was seen by Pulmonology just prior to my evaluation and vital signs were reviewed.    General appearance: alert, cooperative and appears stated age Back: symmetric, no curvature. ROM restricted,  Left sided paraspinus muscle spasm and tenderness.  No CVA tenderness. Lungs: clear to auscultation bilaterally Heart: regular rate and rhythm, S1, S2 normal, no murmur, click, rub or gallop Abdomen: soft, non-tender; bowel sounds normal; no masses,  no organomegaly Pulses: 2+ and symmetric Skin: Skin color, texture, turgor normal. No rashes or lesions Lymph nodes: Cervical, supraclavicular, and axillary nodes normal.  Assessment and Plan:  Low back pain with radiation Acute, one weeks duration , no prior imaging of spine,  History of osteoporosis, CAD, PAD with prior aortogram and LE angioplasties July 2015, DM, sarcoid and COPD.  No aneurysm noted on prior vascular evaluations,  CT done last week  was done without contrast .  Need to rule out vertebral fracture and herniated disk first,. Plain films ordered,  If negative for fracture will begin steroid taper AFTER Friday's procedure.  vicodin increased to 10/325 mg q 6 hours and bowel regimen given.   Abdominal pain, suprapubic Acute, of over one week's duration,  With ER evaluation on Nov 17th  Ruling out UTI, diverticulitis, perforation and SBO.  She is s/p hyserectomy and ovaries were both visualized and reported as normal on CT. 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 .  Renal arteries are patient per Sept  2015 angiogram [facility]) .  Will increase losartan to 100 mg daily.   A total of 40 minutes was spent with patient more than half of which was spent in counseling patient on the above mentioned issues , reviewing and explaining recent labs and imaging studies done, and coordination of care.   Updated Medication List Outpatient Encounter Prescriptions as of 04/03/2014  Medication Sig  . albuterol (PROVENTIL) (2.5 MG/3ML) 0.083% nebulizer solution Take 3 mLs (2.5 mg total) by nebulization every 6 (six) hours as needed for wheezing or shortness of breath.  . ALPRAZolam (XANAX) 0.5 MG tablet Take 1 tablet (0.5 mg total) by mouth daily as needed for sleep or anxiety.  [name] aspirin 81 MG EC tablet Take 1 tablet (81 mg total) by mouth daily. Swallow whole.  . Blood Glucose Monitoring Suppl (TRUERESULT BLOOD GLUCOSE) W/DEVICE KIT 1 kit by Does not apply route 3 (three) times daily.  . chlorpheniramine-HYDROcodone (TUSSIONEX) 10-8 MG/5ML LQCR Take 5 mLs by mouth at bedtime as needed for cough.  . COMBIVENT RESPIMAT 20-100 MCG/ACT AERS  respimat INHALE ONE PUFF EVERY 6 HOURS  . fluticasone (FLONASE) 50 MCG/ACT nasal spray Place 2 sprays into the nose daily.  . furosemide (LASIX) 20 MG tablet Take 20 mg by mouth daily.   [name] glucose blood test strip Check blood sugar 3 times a day DX CODE: 250.0  . HYDROcodone-acetaminophen (NORCO) 10-325 MG per tablet Take 1 tablet by mouth every 6 (six) hours as needed.  . INS SYRINGE/NEEDLE 1CC/28G 28G X 1/2" 1 ML MISC 1 each by Does not apply route as needed. To test glucose levels daily.  . Insulin Aspart Prot & Aspart (NOVOLOG 70/30 MIX) (70-30) 100 UNIT/ML Pen 44 units before breakfast,  50 units before dinner.  NEEDS 12 PENS PER MONTH  . Insulin Pen Needle (B-D ULTRAFINE III SHORT PEN) 31G X 8 MM MISC USE AS DIRECTED WITH NOVOLOG FLEXPEN  . isosorbide mononitrate (IMDUR) 30 MG 24 hr tablet Take 30 mg by mouth daily.  . Lactulose 20 GM/30ML SOLN 30 ml every 4 hours until  constipation is relieved  . Lancets 30G MISC Check blood glucose TID. Dx 250.00  . losartan (COZAAR) 100 MG tablet Take 1 tablet (100 mg total) by mouth daily.  . metoprolol (LOPRESSOR) 50 MG tablet Take 0.5 tablets (25 mg total) by mouth 2 (two) times daily.  [name] PARoxetine (PAXIL) 10 MG tablet Take 1 tablet (10 mg total) by mouth daily.  Derrill Memo ON 04/08/2014] predniSONE (STERAPRED UNI-PAK) 10 MG tablet 6 tablets on Day 1 , then reduce by 1 tablet daily until gone STARTING NOV 28  . pregabalin (LYRICA) 100 MG capsule Take 1 capsule (100 mg total) by mouth 3 (three) times daily.  . rosuvastatin (CRESTOR) 20 MG tablet Take 1 tablet (20 mg total) by mouth daily.  . TRUETEST TEST test strip CHECK BLOOD GLUCOSE THREE TIMES DAILY  . [DISCONTINUED] HYDROcodone-acetaminophen (NORCO/VICODIN) 5-325 MG per tablet Take 1 tablet by mouth every 6 (six) hours as needed for moderate pain.  . [DISCONTINUED] losartan (COZAAR) 50 MG tablet Take 1 tablet (50 mg total) by mouth daily.

## 2014-04-04 DIAGNOSIS — I10 Essential (primary) hypertension: Secondary | ICD-10-CM | POA: Insufficient documentation

## 2014-04-04 DIAGNOSIS — M5440 Lumbago with sciatica, unspecified side: Secondary | ICD-10-CM

## 2014-04-04 DIAGNOSIS — M545 Low back pain, unspecified: Secondary | ICD-10-CM | POA: Insufficient documentation

## 2014-04-04 MED ORDER — LOSARTAN POTASSIUM 100 MG PO TABS: 100.0000 mg | ORAL_TABLET | Freq: Every day | ORAL | Status: DC

## 2014-04-04 NOTE — Assessment & Plan Note (Signed)
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

## 2014-04-04 NOTE — Assessment & Plan Note (Signed)
Elevated today due to pain and use of OTC decongestants.  Renal areteries are patient per Sept 2015 angiogram [facility]) .  Will increase losartan to 100 mg daily.

## 2014-04-04 NOTE — Assessment & Plan Note (Addendum)
Acute, one weeks duration , no prior imaging of spine,  History of osteoporosis, CAD, PAD with prior aortogram and LE angioplasties July 2015, DM, sarcoid and COPD.  No aneurysm noted on prior vascular evaluations,  CT done last week  was done without contrast .  Need to rule out vertebral fracture and herniated disk first,. Plain films ordered,  If negative for fracture will begin steroid taper AFTER Friday's procedure.  vicodin increased to 10/325 mg q 6 hours and bowel regimen given.

## 2014-04-04 NOTE — Assessment & Plan Note (Signed)
Acute, of over one week's duration,  With ER evaluation on Nov 17th  Ruling out UTI, diverticulitis, perforation and SBO.  She is s/p hyserectomy and ovaries were both visualized and reported as normal on CT. Will treat for constipation, rule out vertebral origin with radiation

## 2014-04-05 ENCOUNTER — Ambulatory Visit: Payer: Medicare Other | Admitting: Nurse Practitioner

## 2014-04-07 ENCOUNTER — Other Ambulatory Visit: Payer: Self-pay | Admitting: Pulmonary Disease

## 2014-04-07 ENCOUNTER — Ambulatory Visit (HOSPITAL_COMMUNITY)
Admission: RE | Admit: 2014-04-07 | Discharge: 2014-04-07 | Disposition: A | Discharge status: Home / Self Care | Payer: Medicare Other | Source: Ambulatory Visit | Attending: Pulmonary Disease | Admitting: Pulmonary Disease

## 2014-04-07 ENCOUNTER — Ambulatory Visit (HOSPITAL_COMMUNITY): Admission: RE | Admit: 2014-04-07 | Payer: Medicare Other | Source: Ambulatory Visit

## 2014-04-07 DIAGNOSIS — J90 Pleural effusion, not elsewhere classified: Secondary | ICD-10-CM

## 2014-04-07 DIAGNOSIS — J940 Chylous effusion: Secondary | ICD-10-CM

## 2014-04-07 DIAGNOSIS — I898 Other specified noninfective disorders of lymphatic vessels and lymph nodes: Secondary | ICD-10-CM

## 2014-04-07 DIAGNOSIS — D869 Sarcoidosis, unspecified: Secondary | ICD-10-CM

## 2014-04-07 LAB — CBC
HEMATOCRIT: 37.3 % (ref 36.0–46.0)
Hemoglobin: 12.1 g/dL (ref 12.0–15.0)
MCH: 29.5 pg (ref 26.0–34.0)
MCHC: 32.4 g/dL (ref 30.0–36.0)
MCV: 91 fL (ref 78.0–100.0)
Platelets: 229 10*3/uL (ref 150–400)
RBC: 4.1 MIL/uL (ref 3.87–5.11)
RDW: 13.2 % (ref 11.5–15.5)
WBC: 5.8 10*3/uL (ref 4.0–10.5)

## 2014-04-07 LAB — PROTIME-INR
INR: 1.03 (ref 0.00–1.49)
Prothrombin Time: 13.6 seconds (ref 11.6–15.2)

## 2014-04-07 NOTE — Progress Notes (Signed)
Pt seen at this time by Dr. Lake Bells for thoracentesis.  On evaluation with ultrasound Dr. Lake Bells could only identify a very small effusion which he did not want to drain at this time.  Pt taken to radiology for second look.

## 2014-04-07 NOTE — Procedures (Signed)
[facility]  I brought [name] to the [facility] at [facility] today for a thoracentesis for a recurrent right sided effusion seen on a CT scan from last week.    She was consented for the procedure.  I looked with ultrasound over the right chest and was able to identify the diaphragm and sliding lung but I could only identify a very small effusion in the right lung which was not amenable to drainage.  Will send to ultrasound for a second look by the radiology team.  Roselie Awkward, MD [facility] Pager: [phone] Cell: [phone] If no response, call [phone]

## 2014-04-10 ENCOUNTER — Ambulatory Visit: Payer: Medicare Other | Admitting: Nurse Practitioner

## 2014-04-11 ENCOUNTER — Ambulatory Visit: Payer: Self-pay | Admitting: Internal Medicine

## 2014-04-11 DIAGNOSIS — M4316 Spondylolisthesis, lumbar region: Secondary | ICD-10-CM | POA: Diagnosis not present

## 2014-04-11 DIAGNOSIS — M545 Low back pain: Secondary | ICD-10-CM | POA: Diagnosis not present

## 2014-04-13 ENCOUNTER — Telehealth: Payer: Self-pay | Admitting: Internal Medicine

## 2014-04-13 DIAGNOSIS — M5440 Lumbago with sciatica, unspecified side: Principal | ICD-10-CM

## 2014-04-13 DIAGNOSIS — M545 Low back pain, unspecified: Secondary | ICD-10-CM

## 2014-04-13 NOTE — Telephone Encounter (Signed)
There is no evidence of new new fracture or severe disk space loss to explain her back pain,  If it persists,  Will need MRI

## 2014-04-13 NOTE — Telephone Encounter (Signed)
Left message for pt to return my call.

## 2014-04-13 NOTE — Telephone Encounter (Signed)
Pt left VM, returning my call. Called back, no answer, no VM. Will try again later.

## 2014-04-13 NOTE — Assessment & Plan Note (Signed)
There is no evidence of new new fracture or severe disk space loss to explain her back pain,  If it persists,  Will need MRI

## 2014-04-14 NOTE — Telephone Encounter (Signed)
Called and advised patient of results,  verbalized understanding. 

## 2014-05-03 ENCOUNTER — Encounter: Payer: Self-pay | Admitting: Internal Medicine

## 2014-05-08 ENCOUNTER — Encounter: Payer: Self-pay | Admitting: *Deleted

## 2014-05-08 ENCOUNTER — Ambulatory Visit: Payer: Self-pay | Admitting: Internal Medicine

## 2014-05-08 DIAGNOSIS — Z1231 Encounter for screening mammogram for malignant neoplasm of breast: Secondary | ICD-10-CM | POA: Diagnosis not present

## 2014-05-08 DIAGNOSIS — E119 Type 2 diabetes mellitus without complications: Secondary | ICD-10-CM | POA: Diagnosis not present

## 2014-05-08 DIAGNOSIS — I739 Peripheral vascular disease, unspecified: Secondary | ICD-10-CM | POA: Diagnosis not present

## 2014-05-08 DIAGNOSIS — I10 Essential (primary) hypertension: Secondary | ICD-10-CM | POA: Diagnosis not present

## 2014-05-08 DIAGNOSIS — I209 Angina pectoris, unspecified: Secondary | ICD-10-CM | POA: Diagnosis not present

## 2014-05-08 LAB — HM MAMMOGRAPHY: HM MAMMO: NEGATIVE

## 2014-05-10 ENCOUNTER — Other Ambulatory Visit: Payer: Self-pay | Admitting: Internal Medicine

## 2014-05-10 MED ORDER — PREGABALIN 100 MG PO CAPS: 100.0000 mg | ORAL_CAPSULE | Freq: Three times a day (TID) | ORAL | Status: DC

## 2014-05-10 NOTE — Telephone Encounter (Signed)
Refill printed for signature and faxed to pharmacy .

## 2014-05-17 ENCOUNTER — Other Ambulatory Visit: Payer: Self-pay | Admitting: Internal Medicine

## 2014-05-17 ENCOUNTER — Other Ambulatory Visit: Payer: Self-pay | Admitting: *Deleted

## 2014-05-17 MED ORDER — GLUCOSE BLOOD VI STRP: ORAL_STRIP | Status: DC

## 2014-05-17 NOTE — Telephone Encounter (Signed)
Fax from [place], needing new Rx with ICD 10 code. Rx sent to pharmacy by escript

## 2014-05-26 DIAGNOSIS — I739 Peripheral vascular disease, unspecified: Secondary | ICD-10-CM | POA: Diagnosis not present

## 2014-05-26 DIAGNOSIS — I70211 Atherosclerosis of native arteries of extremities with intermittent claudication, right leg: Secondary | ICD-10-CM | POA: Diagnosis not present

## 2014-05-26 DIAGNOSIS — M79609 Pain in unspecified limb: Secondary | ICD-10-CM | POA: Diagnosis not present

## 2014-06-02 ENCOUNTER — Ambulatory Visit: Payer: Medicare Other | Admitting: Internal Medicine

## 2014-06-21 LAB — HM DIABETES EYE EXAM

## 2014-06-28 ENCOUNTER — Telehealth: Payer: Self-pay | Admitting: Internal Medicine

## 2014-06-28 NOTE — Telephone Encounter (Signed)
Patient Name: EDENA CARANDANG  DOB: 12-13-1940    Initial Comment caller states she has an appt for April 11th - she fell a few weeks ago - is still having pain in her back leg and arm - that she fell on - she would like to see if she can get an earlier appt time if possible   Nurse Assessment  Nurse: Mallie Mussel, RN, Alveta Heimlich Date/Time Eilene Ghazi Time): 06/28/2014 12:03:34 PM  Confirm and document reason for call. If symptomatic, describe symptoms. ---Caller states that she fell a few weeks ago and has pain in her left arm, left leg, and the left side of her back. She rates her pain as 8 on 0-10 scale. It seems to bother her more when she is walking, but she has some bad pains even while sitting. She takes Tylenol which helps a little.  Has the patient traveled out of the country within the last 30 days? ---No  Does the patient require triage? ---Yes  Related visit to physician within the last 2 weeks? ---No  Does the PT have any chronic conditions? (i.e. diabetes, asthma, etc.) ---Yes  List chronic conditions. ---Asthma, Diabetes, HTN, Hypercholesterolemia     Guidelines    Guideline Title Affirmed Question Affirmed Notes  Leg Pain Numbness in a leg or foot (i.e., loss of sensation) numbness in her feet   Final Disposition User   See Physician within [place], RN, Alveta Heimlich    Comments  Appt scheduled for 0830 with Lorane Gell NP

## 2014-06-28 NOTE — Telephone Encounter (Signed)
FYI scheduled with Lorane Gell NP

## 2014-06-29 ENCOUNTER — Ambulatory Visit (INDEPENDENT_AMBULATORY_CARE_PROVIDER_SITE_OTHER): Payer: Medicare Other | Admitting: Nurse Practitioner

## 2014-06-29 ENCOUNTER — Encounter: Payer: Self-pay | Admitting: Nurse Practitioner

## 2014-06-29 VITALS — BP 150/78 | HR 86 | Temp 98.1°F | Resp 16 | Ht 63.0 in | Wt 191.0 lb

## 2014-06-29 DIAGNOSIS — E1129 Type 2 diabetes mellitus with other diabetic kidney complication: Secondary | ICD-10-CM | POA: Diagnosis not present

## 2014-06-29 DIAGNOSIS — M5442 Lumbago with sciatica, left side: Secondary | ICD-10-CM | POA: Diagnosis not present

## 2014-06-29 DIAGNOSIS — E1165 Type 2 diabetes mellitus with hyperglycemia: Secondary | ICD-10-CM | POA: Diagnosis not present

## 2014-06-29 DIAGNOSIS — IMO0002 Reserved for concepts with insufficient information to code with codable children: Secondary | ICD-10-CM

## 2014-06-29 LAB — HEMOGLOBIN A1C: HEMOGLOBIN A1C: 8.4 % — AB (ref 4.6–6.5)

## 2014-06-29 LAB — BASIC METABOLIC PANEL
BUN: 26 mg/dL — AB (ref 6–23)
CALCIUM: 9.8 mg/dL (ref 8.4–10.5)
CO2: 28 mEq/L (ref 19–32)
CREATININE: 1.26 mg/dL — AB (ref 0.40–1.20)
Chloride: 104 mEq/L (ref 96–112)
GFR: 53.36 mL/min — ABNORMAL LOW (ref >60.00)
Glucose, Bld: 100 mg/dL — ABNORMAL HIGH (ref 70–99)
Potassium: 4.3 mEq/L (ref 3.5–5.1)
Sodium: 137 mEq/L (ref 135–145)

## 2014-06-29 NOTE — Progress Notes (Signed)
Pre visit review using our clinic review tool, if applicable. No additional management support is needed unless otherwise documented below in the visit note. 

## 2014-06-29 NOTE — Progress Notes (Signed)
Subjective:    Patient ID: Pamela Collier, female    DOB: 19-Apr-1941, 74 y.o.   MRN: [id number]  HPI  Ms. Rodger is a 74 yo female with a CC of leg pain after a fall. Left side body pain and back pain x 2 weeks after fall.   1) Lumbar x-ray 05/03/14 reviewed- Osteopenia noted, mid lumbar osteophytes, thoracic vertebral wedging is stable, no other issues found.   2 weeks ago was in a swivel chair, tipped over and fell of left side, pt states she hit her head, did not lose conciousness, was able to get up and took a shower, which was helpful. She did not seek emergency care or primary care afterwards.   Lower left leg up to back, intermittent pain aching pain, side and back of calf up to posterior lumbar back.  Standing and walking are the worst   Norco- not helpful much Tylenol- helps a little   105 blood sugar this morning    Review of Systems  Constitutional: Negative for fever, chills, diaphoresis and fatigue.  Respiratory: Negative for chest tightness, shortness of breath and wheezing.   Cardiovascular: Negative for chest pain, palpitations and leg swelling.  Gastrointestinal: Negative for nausea, vomiting, diarrhea and rectal pain.  Musculoskeletal: Positive for myalgias, back pain and arthralgias.  Skin: Negative for rash.  Neurological: Negative for dizziness, weakness, numbness and headaches.       Denies losing bowel/bladder function, saddle anesthesia, or weakness of extremities.   Psychiatric/Behavioral: The patient is not nervous/anxious.    Past Medical History  Diagnosis Date  . Diabetes mellitus   . 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 Cardiomyopathy, ischemic Jan 2013    EF 35 to 40% [facility])  . Hepatic steatosis     by CT abd pelvis  . Osteoporosis, post-menopausal   . 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 Marital Status: Married    Spouse Name: N/A  . Number of Children: N/A  . Years of Education: N/A   Occupational History  . Not on file.   Social History Main Topics  . Smoking status: Never Smoker   . Smokeless tobacco: Never Used  . Alcohol Use: No  . Drug Use: No  . Sexual Activity: Yes   Other Topics Concern  . Not on file   Social History Narrative    Past Surgical History  Procedure Laterality Date  . Abdominal hysterectomy      at ge 39. secondary to bleeding  . Toe amputation  Sept 2012    Right 2nd toe, [place]  . Ptca  August 2012    Right Posterior tibial artery , [place]    Family History  Problem Relation Age of Onset  . Heart disease Father   . Heart disease Sister   . Heart disease Brother   . Asthma Grandchild   . Breast cancer Maternal Aunt     Allergies  Allergen Reactions  . Gabapentin Itching  . Sulfa Antibiotics Itching    Current Outpatient Prescriptions on File Prior to Visit  Medication Sig Dispense Refill  . albuterol (PROVENTIL) (2.5 MG/3ML) 0.083% nebulizer solution Take 3 mLs (2.5 mg total) by nebulization every 6 (six) hours as needed  for wheezing or shortness of breath. 150 mL 1  . ALPRAZolam (XANAX) 0.5 MG tablet Take 1 tablet (0.5 mg total) by mouth daily as needed for sleep or anxiety. 30 tablet 3  . aspirin 81 MG EC tablet Take 1 tablet (81 mg total) by mouth daily. Swallow whole. 30 tablet 12  . Blood Glucose Monitoring Suppl (TRUERESULT BLOOD GLUCOSE) W/DEVICE KIT 1 kit by Does not apply route 3 (three) times daily. 1 each 0  . chlorpheniramine-HYDROcodone (TUSSIONEX) 10-8 MG/5ML LQCR Take 5 mLs by mouth at bedtime as needed for cough. 180 mL 0  . COMBIVENT RESPIMAT 20-100 MCG/ACT AERS respimat INHALE ONE PUFF EVERY 6 HOURS 4 g 1  . fluticasone (FLONASE) 50 MCG/ACT nasal spray Place 2 sprays into the nose daily. 16 g 6  . furosemide (LASIX)  20 MG tablet Take 20 mg by mouth daily.     [name] glucose blood (TRUETEST TEST) test strip CHECK BLOOD GLUCOSE THREE TIMES DAILY Dx E11.29 100 each 5  . HYDROcodone-acetaminophen (NORCO) 10-325 MG per tablet Take 1 tablet by mouth every 6 (six) hours as needed. 120 tablet 0  . INS SYRINGE/NEEDLE 1CC/28G 28G X 1/2" 1 ML MISC 1 each by Does not apply route as needed. To test glucose levels daily.    . Insulin Aspart Prot & Aspart (NOVOLOG 70/30 MIX) (70-30) 100 UNIT/ML Pen 44 units before breakfast,  50 units before dinner.  NEEDS 12 PENS PER MONTH 30 mL 11  . Insulin Pen Needle (B-D ULTRAFINE III SHORT PEN) 31G X 8 MM MISC USE AS DIRECTED WITH NOVOLOG FLEXPEN 100 each 3  . isosorbide mononitrate (IMDUR) 30 MG 24 hr tablet Take 30 mg by mouth daily.    . Lactulose 20 GM/30ML SOLN 30 ml every 4 hours until constipation is relieved 236 mL 3  . Lancets 30G MISC Check blood glucose TID. Dx 250.00 100 each 5  . losartan (COZAAR) 100 MG tablet Take 1 tablet (100 mg total) by mouth daily. 30 tablet 1  . metoprolol (LOPRESSOR) 50 MG tablet Take 0.5 tablets (25 mg total) by mouth 2 (two) times daily. 30 tablet 5  . PARoxetine (PAXIL) 10 MG tablet Take 1 tablet (10 mg total) by mouth daily. 30 tablet 2  . pregabalin (LYRICA) 100 MG capsule Take 1 capsule (100 mg total) by mouth 3 (three) times daily. 270 capsule 3  . rosuvastatin (CRESTOR) 20 MG tablet Take 1 tablet (20 mg total) by mouth daily. 30 tablet 11   Current Facility-Administered Medications on File Prior to Visit  Medication Dose Route Frequency Provider Last Rate Last Dose  . albuterol (PROVENTIL) (5 MG/ML) 0.5% nebulizer solution 2.5 mg  2.5 mg Nebulization Once [name], MD           Objective:   Physical Exam  Constitutional: She is oriented to person, place, and time. She appears well-developed and well-nourished. No distress.  BP 150/78 mmHg  Pulse 86  Temp(Src) 98.1 F (36.7 C) (Oral)  Resp 16  Ht _0  (1.6 m)  Wt 191 lb  (86.637 kg)  BMI 33.84 kg/m2  SpO2 96%   HENT:  Head: Normocephalic and atraumatic.  Right Ear: External ear normal.  Left Ear: External ear normal.  Cardiovascular: Normal rate, regular rhythm, normal heart sounds and intact distal pulses.  Exam reveals no gallop and no friction rub.   No murmur heard. Pulmonary/Chest: Effort normal and breath sounds normal. No respiratory distress. She has no wheezes. She has  no rales. She exhibits no tenderness.  Musculoskeletal: She exhibits tenderness. She exhibits no edema.  4/5 left leg iliopsoas 5/5 on right, 5/5 quadriceps bilaterally, tib anterior 5/5 bilaterally, EHL 5/5 bilaterally, 2- achilles and patella DTR's bilaterally.   Neurological: She is alert and oriented to person, place, and time. No cranial nerve deficit. She exhibits normal muscle tone. Coordination normal.  Skin: Skin is warm and dry. No rash noted. She is not diaphoretic.  Psychiatric: She has a normal mood and affect. Her behavior is normal. Judgment and thought content normal.       Assessment & Plan:

## 2014-06-29 NOTE — Patient Instructions (Signed)
We will contact you about your MRI to schedule.  Please head to lab before leaving today.

## 2014-07-02 NOTE — Assessment & Plan Note (Signed)
Worsening after fall x 2 weeks. Pt is agreeable to MRI for further investigation at this time. FU in early March with Dr. Derrel Nip. She has several pain meds. Asked her to continue those.

## 2014-07-02 NOTE — Assessment & Plan Note (Signed)
Pt requesting diabetic labs for when she will see Dr. Derrel Nip in March they will be complete. Obtain A1c and BMET today.

## 2014-07-06 ENCOUNTER — Other Ambulatory Visit: Payer: Self-pay | Admitting: Nurse Practitioner

## 2014-07-11 IMAGING — CR DG CHEST 1V PORT
1 series · 1 of 1 positions shown · non-contrast
Comparison: none

REASON FOR EXAM: dyspnea
COMMENTS:

PROCEDURE:     DXR - DXR PORTABLE CHEST SINGLE VIEW  - March 18, 2012  [DATE]
RESULT:     Comparison is made to the study March 15, 2012.
There is progressive volume loss with increased density at the lung bases.
The cardiac silhouette is mildly enlarged. The pulmonary vascularity is
indistinct.

[ap]
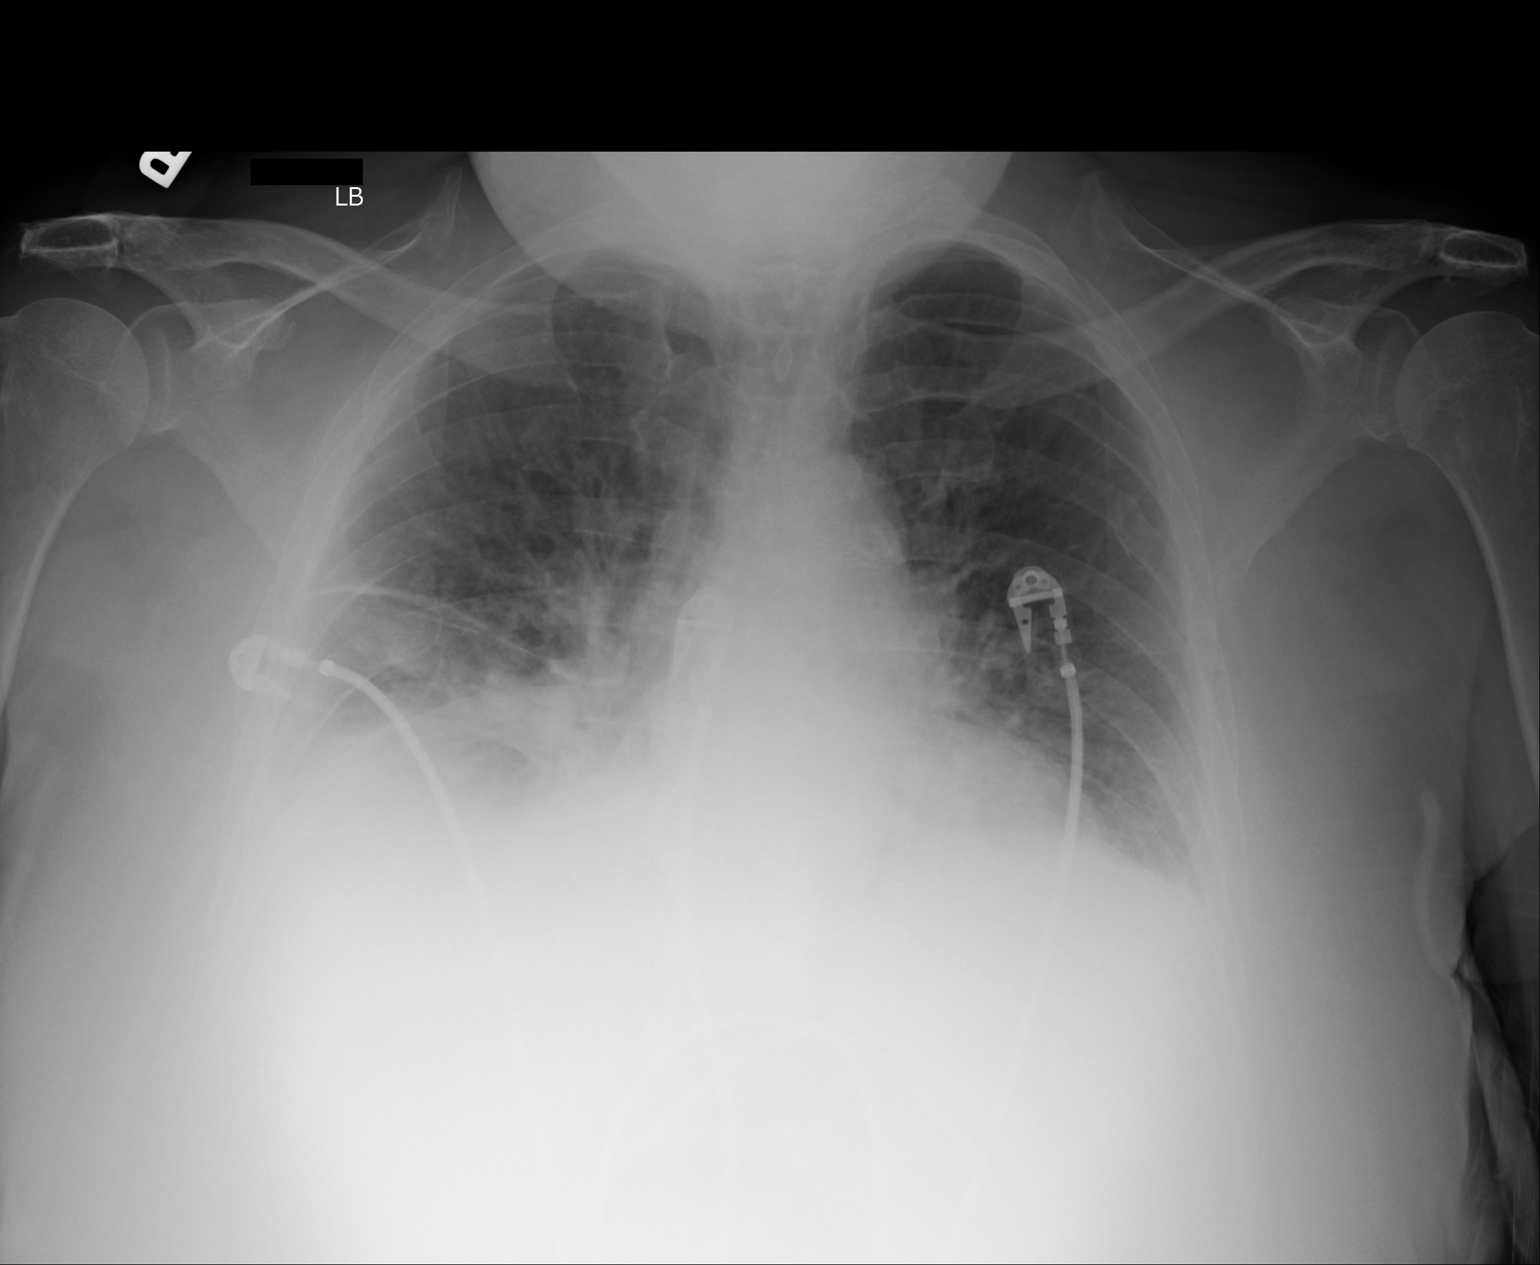

[1 of 1 positions shown; findings below may reference images not displayed]

IMPRESSION: The findings are worrisome for congestive heart failure
with pulmonary interstitial edema and bilateral pleural effusions. There has
been a deterioration in the appearance of the chest since the earlier study.

A followup PA and lateral chest x-ray would be useful when the patient can
tolerate the procedure.

[REDACTED]

## 2014-07-12 ENCOUNTER — Other Ambulatory Visit: Payer: Self-pay | Admitting: Internal Medicine

## 2014-07-12 ENCOUNTER — Ambulatory Visit: Payer: Self-pay | Admitting: Nurse Practitioner

## 2014-07-12 ENCOUNTER — Telehealth: Payer: Self-pay | Admitting: Internal Medicine

## 2014-07-12 DIAGNOSIS — M4806 Spinal stenosis, lumbar region: Secondary | ICD-10-CM | POA: Diagnosis not present

## 2014-07-12 DIAGNOSIS — M5126 Other intervertebral disc displacement, lumbar region: Secondary | ICD-10-CM | POA: Diagnosis not present

## 2014-07-12 NOTE — Telephone Encounter (Signed)
Unsure what she is referring to. I think she completed Boniva. She has an appointment the 11th with Dr. Derrel Nip, her PCP she can wait until then to discuss.

## 2014-07-12 NOTE — Telephone Encounter (Signed)
Please advise Pamela Collier was patient prescribed Boniva  I don't see it in her chart.

## 2014-07-12 NOTE — Telephone Encounter (Signed)
Pamela Collier informed the patient that there were other vitamins that she should be taking ,but she could not remember what they were.  ibandronate (BONIVA) 150 MG tablet

## 2014-07-13 ENCOUNTER — Other Ambulatory Visit: Payer: Self-pay | Admitting: Internal Medicine

## 2014-07-17 ENCOUNTER — Telehealth: Payer: Self-pay | Admitting: Internal Medicine

## 2014-07-17 NOTE — Telephone Encounter (Signed)
Please request results. Thanks!

## 2014-07-17 NOTE — Telephone Encounter (Signed)
Patient called for results on MRI, Please advise.

## 2014-07-18 NOTE — Telephone Encounter (Signed)
Have faxed for patient MRI results. Have MRI results placed for Pamela Gell NP to read.

## 2014-07-18 NOTE — Telephone Encounter (Signed)
Notified patient MD would advise of MRI results.

## 2014-07-20 ENCOUNTER — Ambulatory Visit (INDEPENDENT_AMBULATORY_CARE_PROVIDER_SITE_OTHER): Payer: Medicare Other | Admitting: Internal Medicine

## 2014-07-20 ENCOUNTER — Encounter: Payer: Self-pay | Admitting: Internal Medicine

## 2014-07-20 VITALS — BP 134/60 | HR 60 | Resp 14 | Ht 63.0 in | Wt 194.5 lb

## 2014-07-20 DIAGNOSIS — E1165 Type 2 diabetes mellitus with hyperglycemia: Secondary | ICD-10-CM | POA: Diagnosis not present

## 2014-07-20 DIAGNOSIS — M810 Age-related osteoporosis without current pathological fracture: Secondary | ICD-10-CM

## 2014-07-20 DIAGNOSIS — E1129 Type 2 diabetes mellitus with other diabetic kidney complication: Secondary | ICD-10-CM | POA: Diagnosis not present

## 2014-07-20 DIAGNOSIS — M5442 Lumbago with sciatica, left side: Secondary | ICD-10-CM

## 2014-07-20 DIAGNOSIS — IMO0002 Reserved for concepts with insufficient information to code with codable children: Secondary | ICD-10-CM

## 2014-07-20 LAB — HM DIABETES FOOT EXAM: HM Diabetic Foot Exam: DECREASED

## 2014-07-20 MED ORDER — TRAMADOL HCL 50 MG PO TABS: 50.0000 mg | ORAL_TABLET | Freq: Four times a day (QID) | ORAL | Status: DC | PRN

## 2014-07-20 MED ORDER — ALENDRONATE SODIUM 70 MG PO TABS: ORAL_TABLET | ORAL | Status: DC

## 2014-07-20 MED ORDER — MELOXICAM 15 MG PO TABS: 15.0000 mg | ORAL_TABLET | Freq: Every day | ORAL | Status: DC

## 2014-07-20 MED ORDER — LANCETS 30G MISC: Status: DC

## 2014-07-20 NOTE — Progress Notes (Signed)
Pre visit review using our clinic review tool, if applicable. No additional management support is needed unless otherwise documented below in the visit note. 

## 2014-07-20 NOTE — Patient Instructions (Addendum)
For your osteoporosis:  You need to take alendronate once a week on sundays before church,  This replaces boniva.  This is for osteoporosis.  You should also take vitamin D 1000 units daily and calcium 1800 mg daily I recommend getting the half of your calcium Requirements through dietary sources rather than supplements given the recent association of calcium supplements with increased coronary artery calcium scores (You need 1800 mg daily )  Unsweetened almond/coconut milk, Cashew and soy  Milks are all great low calorie low carb, cholesterol free sources of dietary calcium and vitamin D.    Your back pain is due to degenerative changes including bulging disks:  I am starting meloxicam, an anti inflammatory, for your back pain.  Do NOT take motrin or aleve with this .  They are too similar  You can take tylenol up to 4 times  daily if needed.  If yo pain is still present you can add tramadol 50 mg  up to 4 times daily  Regarding your diabetes:   Your a1c has improved.but is not  At goal at 8.4 A1c  (OUR GOAL IS < 7.00 )  You should change your breakfast from cereal to either:   EGGS/LOW CARB TOAST AND BREAKFAST MEAT, or a PROTEIN SHAKE (ATKINS, PREMIER PROTEIN , OR MUSCLE MILK  ALL ARE LOW CARB  HIGH PROTEIN)   For the next week:  Check your sugars before and 2 hours after breakfast  And submit it to me in  When you return from your trip   Here are some other foods that will not make your sugars go up:  All of the foods can be found at grocery stores and in bulk at [employer].  The Atkins protein bars and shakes are available in more varieties at [employer], [employer] and [employer].     7 AM Breakfast:  Choose from the following:  < 5 carbs  Weekdays: Low carbohydrate Protein  Shakes (EAS AdvantEdge "Carb  Control" shakes, Atkins,  Muscle Milk or Premier Protein shakes)     Weekends:  a scrambled egg/bacon/cheese burrito made with Mission's "carb  balance" whole wheat tortilla  (about 10  net carbs )  Eggs,  bacon /sausage , Joseph's pita /lavash bread or  (5 carbs)  A slice of fritatta ( egg based baked dish, no  crust:  google it) (< 10 carbs)   Avoid cereal and bananas, oatmeal and cream of wheat and grits. They are loaded with carbohydrates!   10 AM: high protein snack  (< 5 carbs)   Protein bar by Atkins  Or KIND  (the snack size, < 200 cal, usually < 6 carbs    A stick of cheese:  Around 1 carb,  100 cal      Other so called "protein bars" tend to be loaded with carbohydrates.  Remember, in food advertising, the word "energy" is synonymous for " carbohydrate."  Lunch:   A Sandwich using the bread choices listed, Can use any  Eggs,  lunchmeat, grilled meat or canned tuna).  Can add avocado, regular mayo/mustard  and cheese.  A Salad using blue cheese, ranch,  Goddess dressing  or vinagrette,  No croutons or "confetti" and no "candied nuts" but regular nuts OK.   2 HARD BOILED EGG WHITES AND A CUP OF one of these greek yogurts:    dannon lt n fit greek yogurt         chobani 100 greek yogurt,  Oikos triple zero greek yogurt       No pretzels or chips.  Pickles and miniature sweet peppers are a good low  carb alternative that provide a "crunch"  The bread is the only source of carbohydrate in a sandwich and  can be decreased by trying some of these alternatives to traditional loaf bread:   Joseph's pita bread and Lavash (flat) bread :  50 cal and 4 net carbs  available at [employer]Js and [employer].  Taste better when toasted, use as pita chips  Toufayan makes a variety of  flatbreads and  A PITA POCKET.    LOOK FOR  THE ONES THAT ARE 17 NET CARBS OR LESS    M[employer] makes 2 sizes of  Low carb whole wheat tortillas  (The large one is  210 cal and 6 net carbs)   Avoid "Low fat dressings, as well as Barry Brunner and Rivereno dressings    3 PM/ Mid day  Snack:  Consider  1 ounce of  almonds, walnuts, pistachios, pecans, peanuts,  Macadamia nuts or a nut medley that does not  contain raisins or cranberries.  No "granola"; the dried cranberries and raisins are loaded with carbohydrates. Mixed nuts as long as there are no raisins,  cranberries or dried fruit.    Try the prosciutto/mozzarella cheese sticks by [employer]  In deli /backery section   High protein   To avoid overindulging in snacks: Try drinking a glass of unsweeted almond/coconut milk  Or a cup of coffee with your Atkins chocolate bar to keep you from having 3!!!   Pork rinds!  Yes Pork Rinds are low carb potato chip substitute!   Toasted Joseph's flatbread with hummous dip (chickpeas)       6 PM  Dinner:     Meat/fowl/fish with a green salad, and either broccoli, cauliflower, green beans, spinach, brussel sprouts, bok choy or  Lima beans. Fried in canola oil /olive oil BUT DO NOT BREAD THE PROTEIN!!      There is a low carb pasta by Dreamfield's that is acceptable and tastes great: only 5 digestible carbs/serving.( All grocery stores but BJs carry it )  Prepared Meals:  Try Hurley Cisco Angelo's chicken piccata or chicken or eggplant parm over low carb pasta.(Lowes and BJs)   Marjory Lies Sanchez's "Carnitas" (pulled pork, no sauce,  0 carbs) or his beef pot roast to make a dinner burrito (at [employer])  Barbecue with cole slaw is low carb BUT NO BUN!  SAME WITH HAMBURGERS     Whole wheat pasta is still full of digestible carbs and  Not as low in glycemic index as Dreamfield's.   Brown rice is still rice,  So skip the rice and noodles if you eat [place] or [place] (or at least limit to 1/2 cup)  9 PM snack :   Breyer's "low carb" fudgsicle or  ice cream bar (Carb Smart line), or  Weight  Watcher's ice cream bar , or another "no sugar added" ice cream;  a serving of fresh berries/cherries with whipped cream   Cheese or greek yogurt   8 ounces of [place] unsweetened almond/cococunut milk  Cheese and crackers (using WASA crackers,  They are low carb) or peanut butter on low carb crackers or pita bread     Avoid bananas,  pineapple, grapes  and watermelon on a regular basis because they are high in sugar.  THINK OF THEM AS DESSERT and do not have daily   Remember that  snack Substitutions should be less than 10 NET carbs per serving and meals should be < 20 net carbs. Remember that carbohydrates from fiber do not affect blood sugar, so you can  subtract fiber grams to get the "net carbs " of any particular food item.

## 2014-07-20 NOTE — Progress Notes (Signed)
Patient ID: Pamela Collier, female   DOB: 06/29/1940, 74 y.o.   MRN: [id number]   Patient Active Problem List   Diagnosis Date Noted  . Low back pain with radiation 04/04/2014  . Abdominal pain, suprapubic 04/04/2014  . Essential hypertension 04/04/2014  . Hyperlipidemia LDL goal <100 02/23/2014  . Long-term use of high-risk medication 02/23/2014  . OP (osteoporosis) 08/17/2013  . Dermatophytic onychia 08/17/2013  . Arthritis 08/05/2013  . Unsteady gait 03/24/2013  . History of colonic polyps 02/20/2013  . Need for prophylactic vaccination and inoculation against influenza 02/03/2013  . OSA (obstructive sleep apnea) 12/07/2012  . 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 Dysphagia 12/07/2012  . Pleural effusion 10/25/2012  . Cough 10/25/2012  . Hospital discharge follow-up 09/17/2012  . SOB (shortness of breath) 05/27/2012  . Pulmonary nodule seen on imaging study 05/20/2012  . 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 Cardiomyopathy, ischemic   . Hepatic steatosis   . Osteoporosis, post-menopausal   . 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 DM type 2, uncontrolled, with renal complications 40/98/1191  . Double vessel coronary artery disease 12/14/2011    Subjective:  CC:   Chief Complaint  Patient presents with  . Follow-up    DM follow up. Back pain comes and goes    HPI:   Pamela Collier is a 74 y.o. female who presents for a Pamela Collier, female   DOB: 06-11-1940, 74 y.o.   MRN: [id number]   Follow up on   1) acute on chronoic back pain.  She was seen by Lorane Gell, NP and had a recent MRI post fall,  Which showed two levels where the left  foramen is mildly stenosed,  But no central cancal stenosis at any level.    Her pain is not controlled with tylenol and is severe when she is trying to get up , or get out of bed to bathroom.  Some days it resolved and she can function without pain . worse  on days she does laundry or pushes the vacuum cleaner. Her husband is not helpful at home and has not taken over the chores that aggravate her pain.     2) Uncontrolled DM type 2,  Improved slightly from 9.1 in October using insulin twice daily currently .  Diet reviewed agains and she contineus to lack knowledge about low glycemic index foods.  Eating cereal and instant oatmeal for breakfast.    Lab Results  Component Value Date   HGBA1C 8.4* 06/29/2014    Sees podiatry on Monday [place] for persistent callous with pressure ulcer on the  first metatarsal head  On her left foot    Past Medical History  Diagnosis Date  . Diabetes mellitus   . 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 Cardiomyopathy, ischemic Jan 2013    EF 35 to 40% [facility])  . Hepatic steatosis     by CT abd pelvis  . Osteoporosis, post-menopausal   . 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 Abdominal hysterectomy      at ge 39. secondary to bleeding  . Toe amputation  Sept 2012    Right 2nd  toe, [place]  . Ptca  August 2012    Right Posterior tibial artery , [place]       The following portions of the patient's history were reviewed and updated as appropriate: Allergies, current medications, and problem list.    Review of Systems:   Patient denies headache, fevers, malaise, unintentional weight loss, skin rash, eye pain, sinus congestion and sinus pain, sore throat, dysphagia,  hemoptysis , cough, dyspnea, wheezing, chest pain, palpitations, orthopnea, edema, abdominal pain, nausea, melena, diarrhea, constipation, flank pain, dysuria, hematuria, urinary  Frequency, nocturia, numbness, tingling, seizures,  Focal weakness, Loss of consciousness,  Tremor, insomnia,  depression, anxiety, and suicidal ideation.     History   Social History  . Marital Status: Married    Spouse Name: N/A  . Number of Children: N/A  . Years of Education: N/A   Occupational History  . Not on file.   Social History Main Topics  . Smoking status: Never Smoker   . Smokeless tobacco: Never Used  . Alcohol Use: No  . Drug Use: No  . Sexual Activity: Yes   Other Topics Concern  . Not on file   Social History Narrative    Objective:  Filed Vitals:   07/20/14 1317  BP: 134/60  Pulse: 60  Resp: 14     General appearance: alert, cooperative and appears stated age Ears: normal TM's and external ear canals both ears Throat: lips, mucosa, and tongue normal; teeth and gums normal Neck: no adenopathy, no carotid bruit, supple, symmetrical, trachea midline and thyroid not enlarged, symmetric, no tenderness/mass/nodules Back: symmetric, no curvature. ROM normal. No CVA tenderness. Lungs: clear to auscultation bilaterally Heart: regular rate and rhythm, S1, S2 normal, no murmur, click, rub or gallop Abdomen: soft, non-tender; bowel sounds normal; no masses,  no organomegaly Pulses: 2+ and symmetric Skin: Skin color, texture, turgor normal. No rashes or lesions Lymph nodes: Cervical, supraclavicular, and axillary nodes normal.  Assessment and Plan:  DM type 2, uncontrolled, with renal complications Uncontrolled with some improvement in A1c since last time, but diet is poor  despite completing the diabetes education at [facility].    I have addressed her diet but will not adjust her dose of insulin  utnil i see a log of pre ad post prandials .  AReturn with blood sugars.  Foot exam done today.  She has a history of neuropathy with evidence of a resolving pressure ulcer on the medial side of her great toe. She has  Follow up with podiatry,  Continue ARB, statin, ASA    Osteoporosis, post-menopausal Discussion of the risks and benefits of continuing therapy.   Changing to alendronate for cost savings.    Low back pain with radiation MRI results reviewed with patient today..  No surgical indications .  Regimen for Analgesics support outlined   A total of 40 minutes was spent with patient more than half of which was spent in counseling patient on the above mentioned issues , reviewing and explaining recent labs and imaging studies done, and coordination of care.  Updated Medication List Outpatient Encounter Prescriptions as of 07/20/2014  Medication Sig  . albuterol (PROVENTIL) (2.5 MG/3ML) 0.083% nebulizer solution Take 3 mLs (2.5 mg total) by nebulization every 6 (six) hours as needed for wheezing or shortness of breath.  . ALPRAZolam (XANAX) 0.5 MG tablet Take 1 tablet (0.5 mg total) by mouth daily as needed for sleep or anxiety.  [name] aspirin 81 MG EC tablet Take 1 tablet (81  mg total) by mouth daily. Swallow whole.  . B-D UF III MINI PEN NEEDLES 31G X 5 MM MISC USE AS DIRECTED WITH NOVOLOG FLEXPEN  . Blood Glucose Monitoring Suppl (TRUERESULT BLOOD GLUCOSE) W/DEVICE KIT 1 kit by Does not apply route 3 (three) times daily.  . COMBIVENT RESPIMAT 20-100 MCG/ACT AERS respimat INHALE ONE PUFF EVERY 6 HOURS  . fluticasone (FLONASE) 50 MCG/ACT nasal spray Place 2 sprays into the nose daily.  . furosemide (LASIX) 20 MG tablet Take 20 mg by mouth daily.   [name] glucose blood (TRUETEST TEST) test strip CHECK BLOOD GLUCOSE THREE TIMES DAILY Dx E11.29  . INS SYRINGE/NEEDLE 1CC/28G 28G X 1/2" 1 ML MISC 1 each by Does not apply route as needed. To test glucose levels daily.  . Insulin Aspart Prot & Aspart (NOVOLOG 70/30 MIX) (70-30) 100 UNIT/ML Pen 44 units before breakfast,  50 units before dinner.  NEEDS 12 PENS PER MONTH  . isosorbide mononitrate (IMDUR) 30 MG 24 hr tablet Take 30 mg by mouth daily.  . Lactulose 20 GM/30ML SOLN 30 ml every 4 hours until constipation is relieved  . losartan (COZAAR) 100 MG tablet TAKE ONE TABLET BY MOUTH ONCE DAILY  . metoprolol  (LOPRESSOR) 50 MG tablet Take 0.5 tablets (25 mg total) by mouth 2 (two) times daily.  [name] PARoxetine (PAXIL) 10 MG tablet Take 1 tablet (10 mg total) by mouth daily.  . pregabalin (LYRICA) 100 MG capsule Take 1 capsule (100 mg total) by mouth 3 (three) times daily.  . rosuvastatin (CRESTOR) 20 MG tablet Take 1 tablet (20 mg total) by mouth daily.  . [DISCONTINUED] Lancets 30G MISC Check blood glucose TID. Dx E11.29  . alendronate (FOSAMAX) 70 MG tablet Take with a full glass of water on an empty stomach every Sunday  Take with a full glass of water and take no other meds or food for   30 minutes .  . chlorpheniramine-HYDROcodone (TUSSIONEX) 10-8 MG/5ML LQCR Take 5 mLs by mouth at bedtime as needed for cough. (Patient not taking: Reported on 07/20/2014)  . meloxicam (MOBIC) 15 MG tablet Take 1 tablet (15 mg total) by mouth daily.  . traMADol (ULTRAM) 50 MG tablet Take 1 tablet (50 mg total) by mouth every 6 (six) hours as needed for moderate pain.  . [DISCONTINUED] HYDROcodone-acetaminophen (NORCO) 10-325 MG per tablet Take 1 tablet by mouth every 6 (six) hours as needed.  . [DISCONTINUED] Lancets 30G MISC Check blood glucose TID. Dx 250.00     Orders Placed This Encounter  Procedures  . HM DIABETES EYE EXAM  . HM DIABETES FOOT EXAM    Return in about 4 weeks (around 08/17/2014) for follow up diabetes.

## 2014-07-21 ENCOUNTER — Ambulatory Visit: Payer: Medicare Other | Admitting: Internal Medicine

## 2014-07-21 ENCOUNTER — Other Ambulatory Visit: Payer: Self-pay | Admitting: Internal Medicine

## 2014-07-23 ENCOUNTER — Encounter: Payer: Self-pay | Admitting: Internal Medicine

## 2014-07-23 NOTE — Assessment & Plan Note (Addendum)
Uncontrolled with some improvement in A1c since last time, but diet is poor  despite completing the diabetes education at [facility].    I have addressed her diet but will not adjust her dose of insulin  utnil i see a log of pre ad post prandials .  AReturn with blood sugars.  Foot exam done today.  She has a history of neuropathy with evidence of a resolving pressure ulcer on the medial side of her great toe. She has  Follow up with podiatry,  Continue ARB, statin, ASA

## 2014-07-23 NOTE — Assessment & Plan Note (Signed)
Changing to alendronate for cost savings.

## 2014-07-23 NOTE — Assessment & Plan Note (Signed)
MRI results reviewed.  No surgical indications .  Regimen for Analgesics support outlined

## 2014-07-24 DIAGNOSIS — E1042 Type 1 diabetes mellitus with diabetic polyneuropathy: Secondary | ICD-10-CM | POA: Diagnosis not present

## 2014-07-24 DIAGNOSIS — D2370 Other benign neoplasm of skin of unspecified lower limb, including hip: Secondary | ICD-10-CM | POA: Diagnosis not present

## 2014-07-24 DIAGNOSIS — B351 Tinea unguium: Secondary | ICD-10-CM | POA: Diagnosis not present

## 2014-08-08 DIAGNOSIS — I5032 Chronic diastolic (congestive) heart failure: Secondary | ICD-10-CM | POA: Diagnosis not present

## 2014-08-08 DIAGNOSIS — J9811 Atelectasis: Secondary | ICD-10-CM | POA: Diagnosis not present

## 2014-08-08 DIAGNOSIS — Z7951 Long term (current) use of inhaled steroids: Secondary | ICD-10-CM | POA: Diagnosis not present

## 2014-08-08 DIAGNOSIS — I25110 Atherosclerotic heart disease of native coronary artery with unstable angina pectoris: Secondary | ICD-10-CM | POA: Diagnosis not present

## 2014-08-08 DIAGNOSIS — I739 Peripheral vascular disease, unspecified: Secondary | ICD-10-CM | POA: Diagnosis not present

## 2014-08-08 DIAGNOSIS — R079 Chest pain, unspecified: Secondary | ICD-10-CM | POA: Diagnosis not present

## 2014-08-08 DIAGNOSIS — R0789 Other chest pain: Secondary | ICD-10-CM | POA: Diagnosis not present

## 2014-08-08 DIAGNOSIS — J449 Chronic obstructive pulmonary disease, unspecified: Secondary | ICD-10-CM | POA: Diagnosis present

## 2014-08-08 DIAGNOSIS — I10 Essential (primary) hypertension: Secondary | ICD-10-CM | POA: Diagnosis not present

## 2014-08-08 DIAGNOSIS — J90 Pleural effusion, not elsewhere classified: Secondary | ICD-10-CM | POA: Diagnosis not present

## 2014-08-08 DIAGNOSIS — J811 Chronic pulmonary edema: Secondary | ICD-10-CM | POA: Diagnosis not present

## 2014-08-08 DIAGNOSIS — R0602 Shortness of breath: Secondary | ICD-10-CM | POA: Diagnosis not present

## 2014-08-08 DIAGNOSIS — I2510 Atherosclerotic heart disease of native coronary artery without angina pectoris: Secondary | ICD-10-CM | POA: Diagnosis not present

## 2014-08-08 DIAGNOSIS — J918 Pleural effusion in other conditions classified elsewhere: Secondary | ICD-10-CM | POA: Diagnosis not present

## 2014-08-08 DIAGNOSIS — Z95818 Presence of other cardiac implants and grafts: Secondary | ICD-10-CM | POA: Diagnosis not present

## 2014-08-08 DIAGNOSIS — I119 Hypertensive heart disease without heart failure: Secondary | ICD-10-CM | POA: Diagnosis not present

## 2014-08-08 DIAGNOSIS — E119 Type 2 diabetes mellitus without complications: Secondary | ICD-10-CM | POA: Diagnosis present

## 2014-08-16 ENCOUNTER — Telehealth: Payer: Self-pay | Admitting: Internal Medicine

## 2014-08-16 NOTE — Telephone Encounter (Signed)
[facility] Patient [name] DOB: 1941-04-28 Initial Comment Caller states her blood sugar is high. Caller states she will take it and have the number available when the nurse calls back. Nurse Assessment Nurse: Malva Cogan, RN, Juliann Pulse Date/Time Eilene Ghazi Time): 08/16/2014 5:52:59 PM Confirm and document reason for call. If symptomatic, describe symptoms. ---Caller states that his mother was just discharged from [place] hospital today, was admitted secondary to "fluid in her lungs" & to "clear another artery in her heart". Reports that his mother is a Type 2 diabetic, has current BS of 325 & takes 35 units of Humalog 75/30 every AM & 57 units of Humalog 75/30 at approx 1800-1900 every night. Was advised by discharging physician that she is not to be brought back to [place] at this time. Has the patient traveled out of the country within the last 30 days? ---No Does the patient require triage? ---Yes Related visit to physician within the last 2 weeks? ---Yes Does the PT have any chronic conditions? (i.e. diabetes, asthma, etc.) ---Yes List chronic conditions. ---Type 2 DM, CHF, HTN, high cholesterol Guidelines Guideline Title Affirmed Question Affirmed Notes Diabetes - High Blood Sugar [1] Blood glucose > 240 mg/dl (13 mmol/ l) AND [2] uses insulin (e.g., insulindependent, all type 1 diabetics) (all triage questions negative) Final Disposition User Henry, RN, Juliann Pulse Comments D/W charge nurse Deb whether or not to consider BS that was between 390-400 when pt was still hospitalized as first of 2 BS's > 300 & Deb advised triager that since pt was still in hospital that that BS should not be considered. Also asked caller if his mother was advised that she needed to restrict her fluid intake since she was hospitalized for "fluid in her lungs" & caller denies  this.

## 2014-08-17 DIAGNOSIS — M79642 Pain in left hand: Secondary | ICD-10-CM | POA: Diagnosis not present

## 2014-08-17 DIAGNOSIS — J918 Pleural effusion in other conditions classified elsewhere: Secondary | ICD-10-CM | POA: Diagnosis not present

## 2014-08-17 DIAGNOSIS — R0602 Shortness of breath: Secondary | ICD-10-CM | POA: Diagnosis not present

## 2014-08-17 DIAGNOSIS — Z9989 Dependence on other enabling machines and devices: Secondary | ICD-10-CM | POA: Diagnosis not present

## 2014-08-17 DIAGNOSIS — E119 Type 2 diabetes mellitus without complications: Secondary | ICD-10-CM | POA: Diagnosis not present

## 2014-08-17 DIAGNOSIS — R202 Paresthesia of skin: Secondary | ICD-10-CM | POA: Diagnosis not present

## 2014-08-17 DIAGNOSIS — I110 Hypertensive heart disease with heart failure: Secondary | ICD-10-CM | POA: Diagnosis not present

## 2014-08-17 DIAGNOSIS — J9811 Atelectasis: Secondary | ICD-10-CM | POA: Diagnosis not present

## 2014-08-17 DIAGNOSIS — J449 Chronic obstructive pulmonary disease, unspecified: Secondary | ICD-10-CM | POA: Diagnosis not present

## 2014-08-17 DIAGNOSIS — Z9861 Coronary angioplasty status: Secondary | ICD-10-CM | POA: Diagnosis not present

## 2014-08-17 DIAGNOSIS — F419 Anxiety disorder, unspecified: Secondary | ICD-10-CM | POA: Diagnosis not present

## 2014-08-17 DIAGNOSIS — I509 Heart failure, unspecified: Secondary | ICD-10-CM | POA: Diagnosis not present

## 2014-08-17 DIAGNOSIS — Z955 Presence of coronary angioplasty implant and graft: Secondary | ICD-10-CM | POA: Diagnosis not present

## 2014-08-17 DIAGNOSIS — Z888 Allergy status to other drugs, medicaments and biological substances status: Secondary | ICD-10-CM | POA: Diagnosis not present

## 2014-08-17 DIAGNOSIS — G8929 Other chronic pain: Secondary | ICD-10-CM | POA: Diagnosis not present

## 2014-08-17 DIAGNOSIS — I5033 Acute on chronic diastolic (congestive) heart failure: Secondary | ICD-10-CM | POA: Diagnosis not present

## 2014-08-17 DIAGNOSIS — Z882 Allergy status to sulfonamides status: Secondary | ICD-10-CM | POA: Diagnosis not present

## 2014-08-17 DIAGNOSIS — I119 Hypertensive heart disease without heart failure: Secondary | ICD-10-CM | POA: Diagnosis not present

## 2014-08-17 DIAGNOSIS — R200 Anesthesia of skin: Secondary | ICD-10-CM | POA: Diagnosis not present

## 2014-08-17 DIAGNOSIS — G629 Polyneuropathy, unspecified: Secondary | ICD-10-CM | POA: Diagnosis not present

## 2014-08-17 DIAGNOSIS — R079 Chest pain, unspecified: Secondary | ICD-10-CM | POA: Diagnosis not present

## 2014-08-17 DIAGNOSIS — M545 Low back pain: Secondary | ICD-10-CM | POA: Diagnosis not present

## 2014-08-17 DIAGNOSIS — J90 Pleural effusion, not elsewhere classified: Secondary | ICD-10-CM | POA: Diagnosis not present

## 2014-08-17 DIAGNOSIS — I2510 Atherosclerotic heart disease of native coronary artery without angina pectoris: Secondary | ICD-10-CM | POA: Diagnosis not present

## 2014-08-18 DIAGNOSIS — I509 Heart failure, unspecified: Secondary | ICD-10-CM | POA: Diagnosis not present

## 2014-08-18 DIAGNOSIS — J90 Pleural effusion, not elsewhere classified: Secondary | ICD-10-CM | POA: Diagnosis not present

## 2014-08-18 DIAGNOSIS — J449 Chronic obstructive pulmonary disease, unspecified: Secondary | ICD-10-CM | POA: Diagnosis not present

## 2014-08-18 DIAGNOSIS — I110 Hypertensive heart disease with heart failure: Secondary | ICD-10-CM | POA: Diagnosis not present

## 2014-08-18 DIAGNOSIS — Z9861 Coronary angioplasty status: Secondary | ICD-10-CM | POA: Diagnosis not present

## 2014-08-18 DIAGNOSIS — G629 Polyneuropathy, unspecified: Secondary | ICD-10-CM | POA: Diagnosis not present

## 2014-08-18 DIAGNOSIS — I2510 Atherosclerotic heart disease of native coronary artery without angina pectoris: Secondary | ICD-10-CM | POA: Diagnosis not present

## 2014-08-18 DIAGNOSIS — J918 Pleural effusion in other conditions classified elsewhere: Secondary | ICD-10-CM | POA: Diagnosis not present

## 2014-08-18 DIAGNOSIS — R0602 Shortness of breath: Secondary | ICD-10-CM | POA: Diagnosis not present

## 2014-08-18 DIAGNOSIS — I5033 Acute on chronic diastolic (congestive) heart failure: Secondary | ICD-10-CM | POA: Diagnosis not present

## 2014-08-18 DIAGNOSIS — E119 Type 2 diabetes mellitus without complications: Secondary | ICD-10-CM | POA: Diagnosis not present

## 2014-08-21 ENCOUNTER — Other Ambulatory Visit: Payer: Self-pay | Admitting: *Deleted

## 2014-08-21 ENCOUNTER — Telehealth: Payer: Self-pay | Admitting: *Deleted

## 2014-08-21 ENCOUNTER — Ambulatory Visit: Payer: Medicare Other | Admitting: Internal Medicine

## 2014-08-21 MED ORDER — GLUCOSE BLOOD VI STRP: ORAL_STRIP | Status: DC

## 2014-08-21 NOTE — Telephone Encounter (Signed)
pt needs to cancel appt for today. Pt is in [place]. pt was hospitalized in [place]. Please advise MD about the appt cancellation.

## 2014-08-21 NOTE — Telephone Encounter (Signed)
Do not charge  

## 2014-08-22 ENCOUNTER — Encounter: Payer: Self-pay | Admitting: Internal Medicine

## 2014-08-24 DIAGNOSIS — E785 Hyperlipidemia, unspecified: Secondary | ICD-10-CM | POA: Diagnosis not present

## 2014-08-24 DIAGNOSIS — I2510 Atherosclerotic heart disease of native coronary artery without angina pectoris: Secondary | ICD-10-CM | POA: Diagnosis not present

## 2014-08-24 DIAGNOSIS — I10 Essential (primary) hypertension: Secondary | ICD-10-CM | POA: Diagnosis not present

## 2014-08-24 DIAGNOSIS — E119 Type 2 diabetes mellitus without complications: Secondary | ICD-10-CM | POA: Diagnosis not present

## 2014-08-28 ENCOUNTER — Other Ambulatory Visit: Payer: Self-pay | Admitting: *Deleted

## 2014-08-28 MED ORDER — METOPROLOL TARTRATE 50 MG PO TABS: 25.0000 mg | ORAL_TABLET | Freq: Two times a day (BID) | ORAL | Status: DC

## 2014-08-28 MED ORDER — ISOSORBIDE MONONITRATE ER 30 MG PO TB24: 30.0000 mg | ORAL_TABLET | Freq: Every day | ORAL | Status: DC

## 2014-08-28 NOTE — Telephone Encounter (Signed)
Pt's daughter in law called, needing refill on medications. Pt still in [place], need refills to [facility]. Rx sent to pharmacy by escript   Ok refill Meloxicam?

## 2014-08-29 IMAGING — CR DG CHEST 1V PORT
1 series · 1 of 1 positions shown · non-contrast
Comparison: none

REASON FOR EXAM: chest pain, shortness of breath
COMMENTS:

[portable]
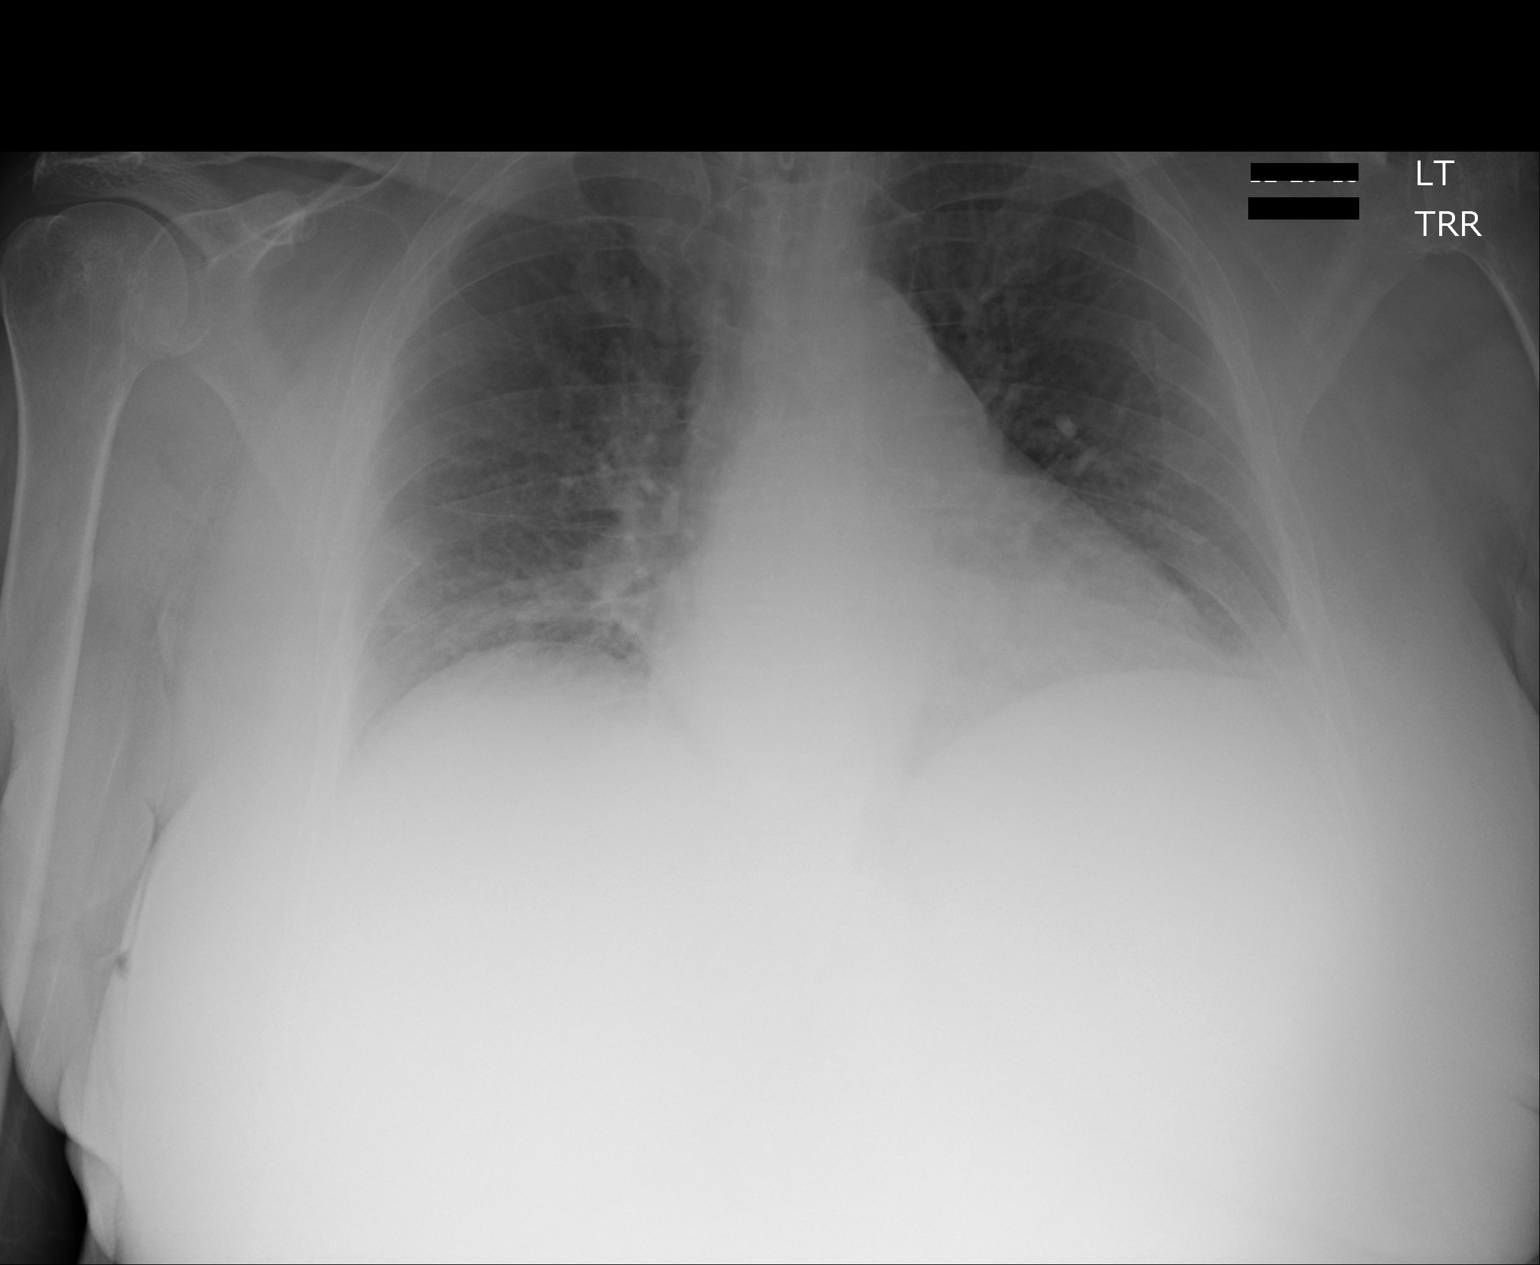

[1 of 1 positions shown; findings below may reference images not displayed]

PROCEDURE:     DXR - DXR PORTABLE CHEST SINGLE VIEW  - May 06, 2012  [DATE]

RESULT:     Comparison is made to the study March 18, 2012.

The lungs are hypoinflated. There is bibasilar atelectasis though the
appearance of the right lung base has improved since the previous study. The
cardiac silhouette is enlarged and the central pulmonary vascularity is
prominent.
IMPRESSION: The findings suggest CHF. Bibasilar atelectasis is
suspected as well. A followup PA and lateral chest x-ray with deep
inspiration would be useful.

[REDACTED]

## 2014-08-29 IMAGING — US US EXTREM LOW VENOUS*L*
1 series · 14 of 24 positions shown · non-contrast
Comparison: none

REASON FOR EXAM: left leg pain and swelling
COMMENTS:

[Series 1: us extrem low venous*left* · 0.09mm/px · 14 of 25 slices shown]
[im 1/25]
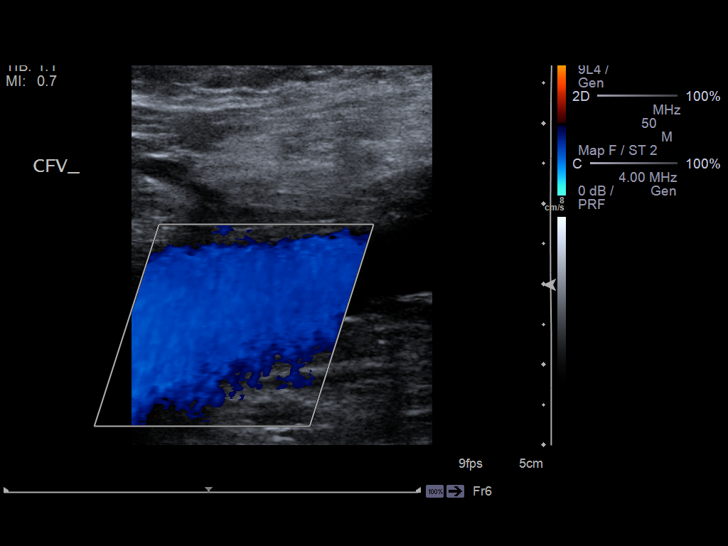
[im 3/25]
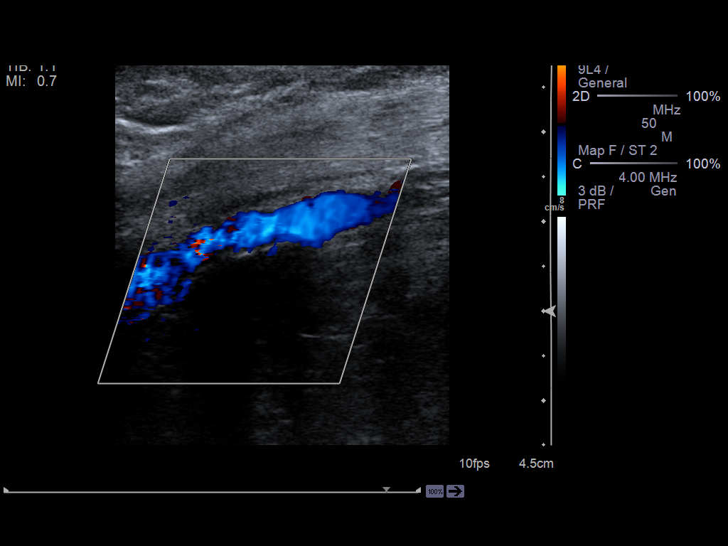
[im 5/25]
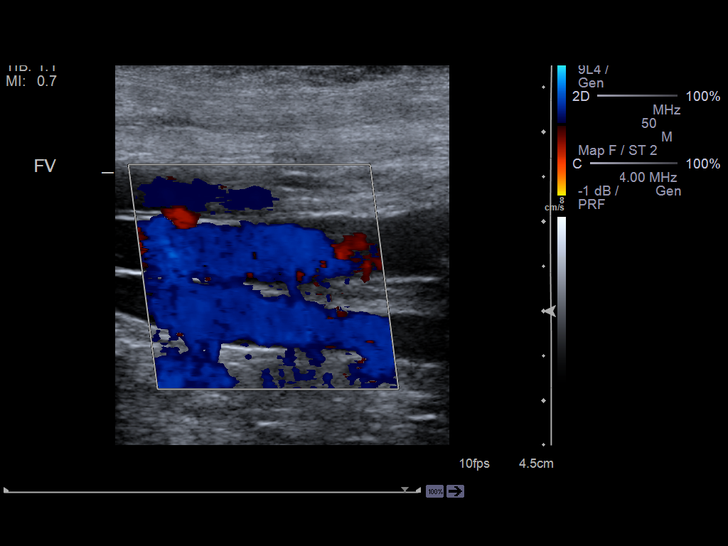
[im 7/25]
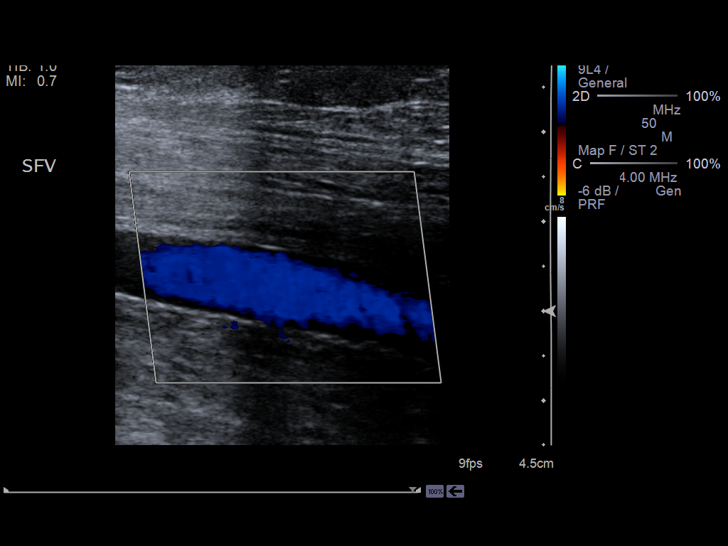
[im 8/25]
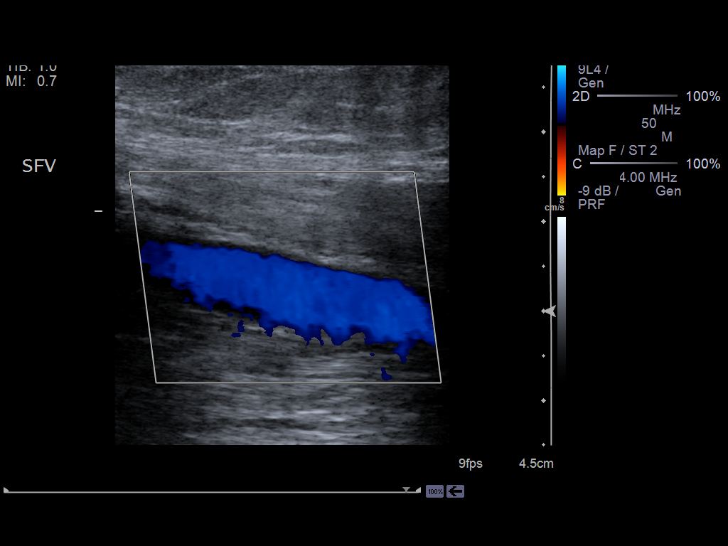
[im 10/25]
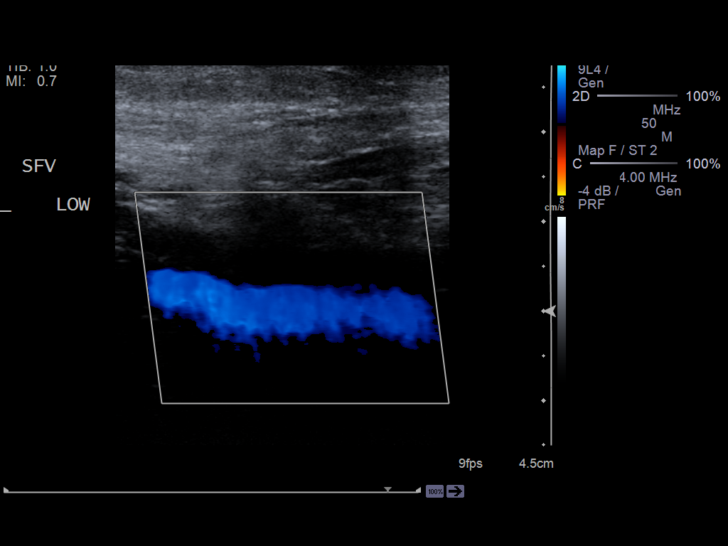
[im 12/25]
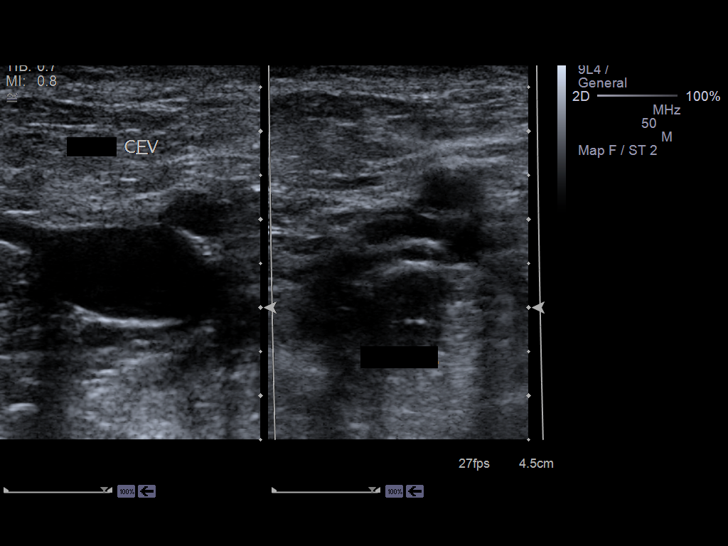
[im 13/25]
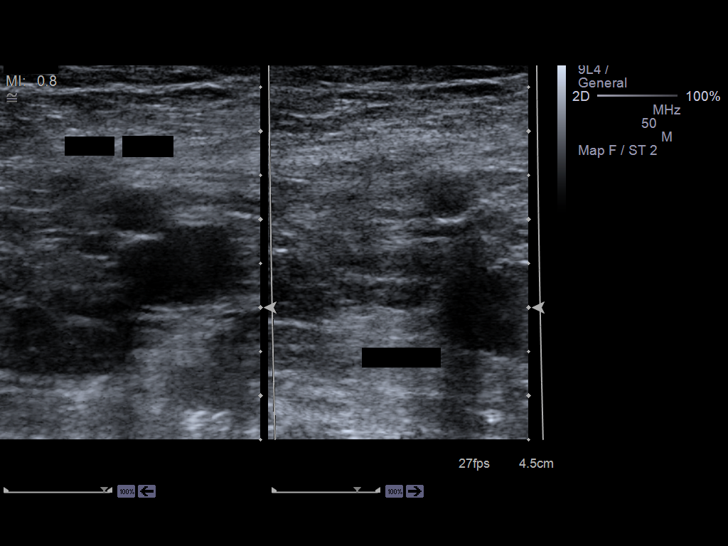
[im 15/25]
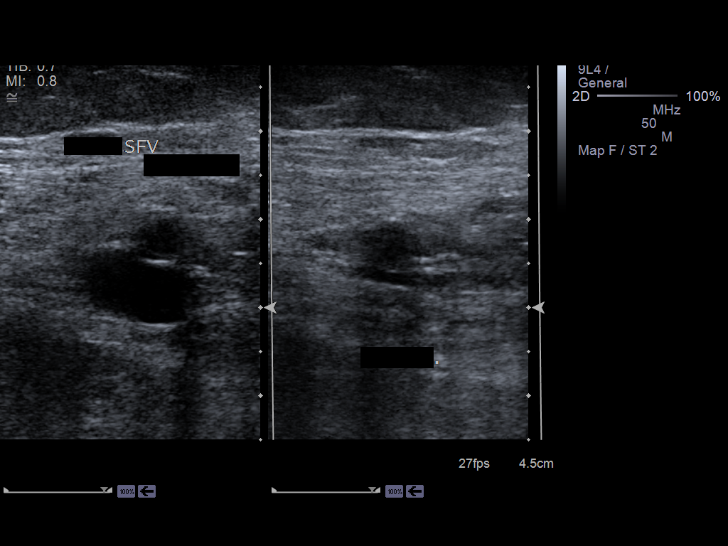
[im 17/25]
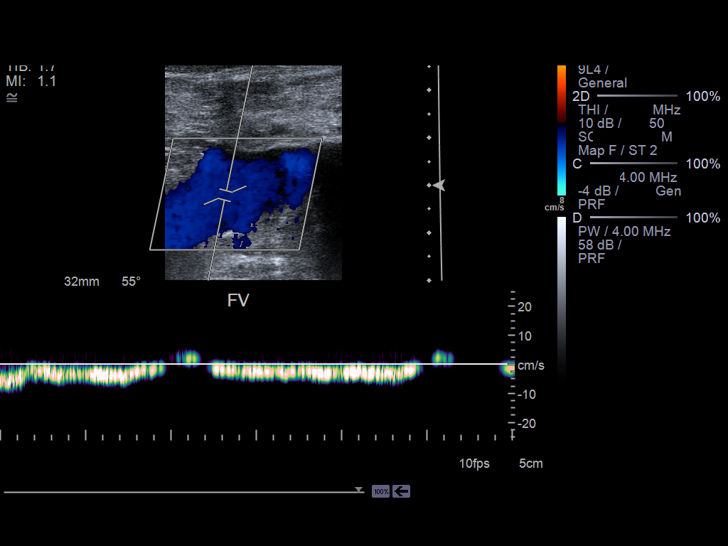
[im 19/25]
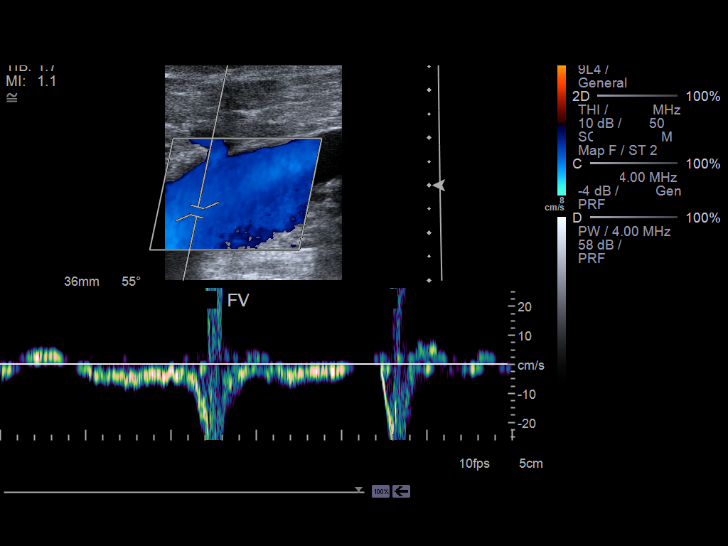
[im 20/25]
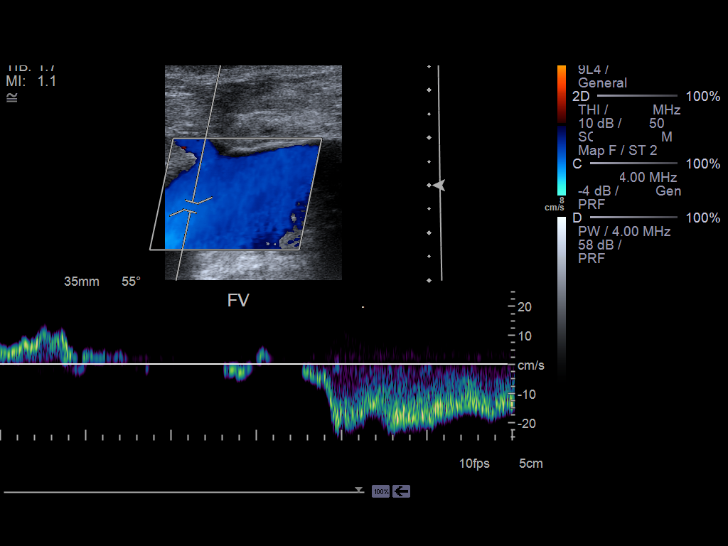
[im 22/25]
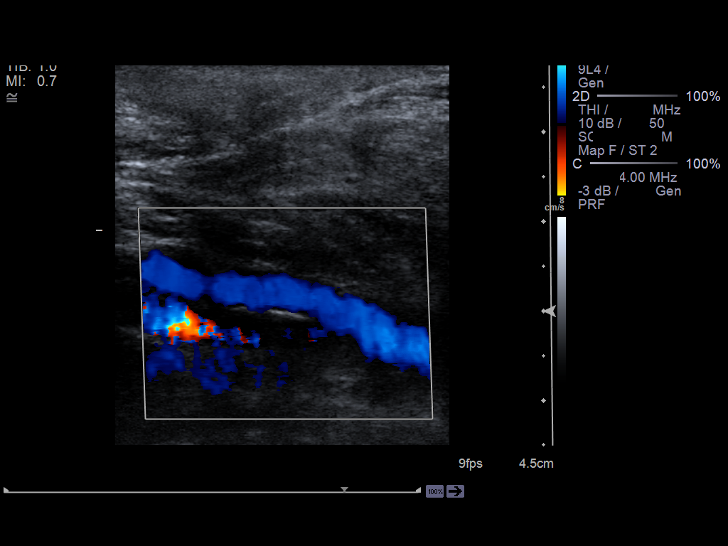
[im 25/25]
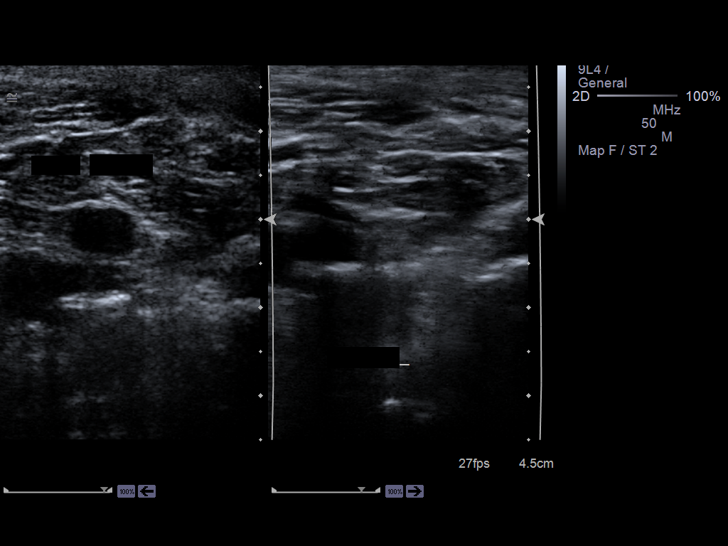

[14 of 24 positions shown; findings below may reference images not displayed]

PROCEDURE:     US  - US DOPPLER LOW EXTR LEFT  - May 06, 2012  [DATE]

RESULT:     The deep venous system of the left lower extremity was evaluated
with grayscale and color flow Doppler techniques.

The left common femoral, superficial femoral, and popliteal veins are
normally compressible. The waveform patterns are normal and the color flow
images are normal.
IMPRESSION: There is no evidence of thrombus within the left femoral or
popliteal veins.

[REDACTED]

## 2014-08-29 IMAGING — CR CERVICAL SPINE - 2-3 VIEW
1 series · 5 of 5 positions shown · non-contrast
Comparison: none

REASON FOR EXAM: left neck pain
COMMENTS:

[Series 1: w cervical spine lat · 0.14mm/px · 5 of 5 slices shown]
[im 1/5]
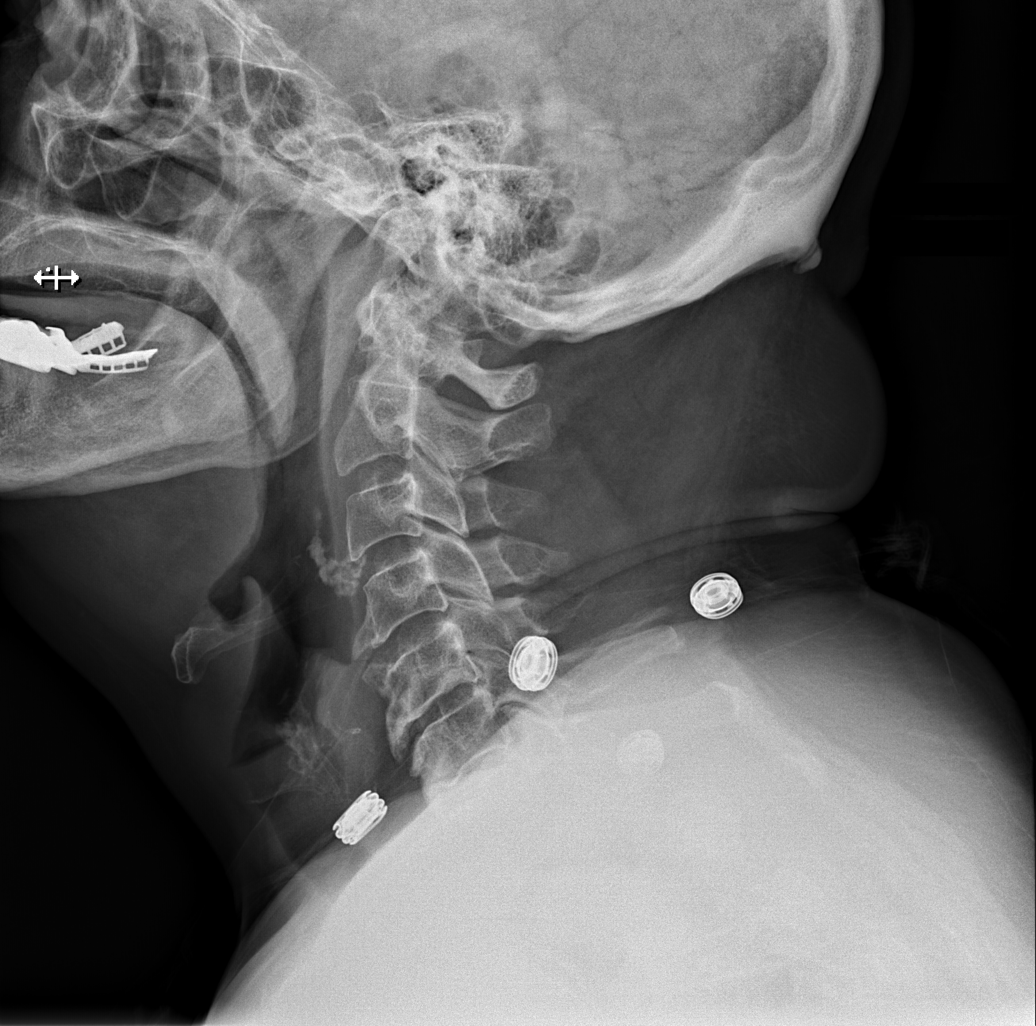
[im 2/5]
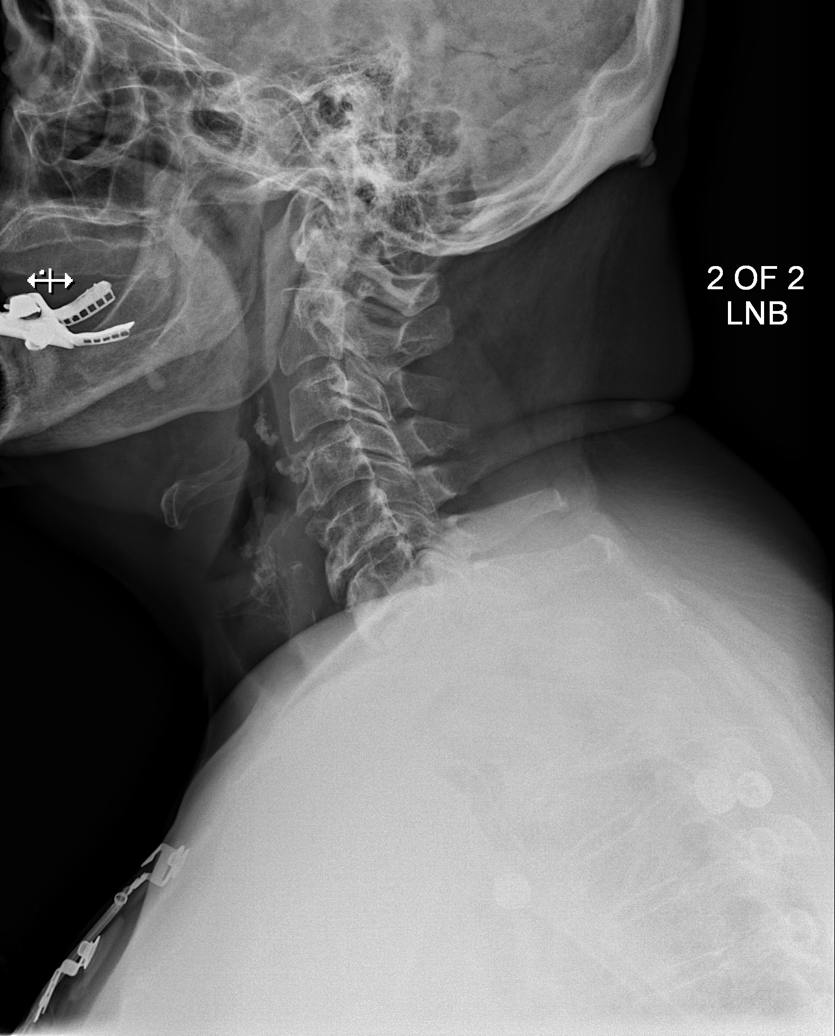
[im 3/5]
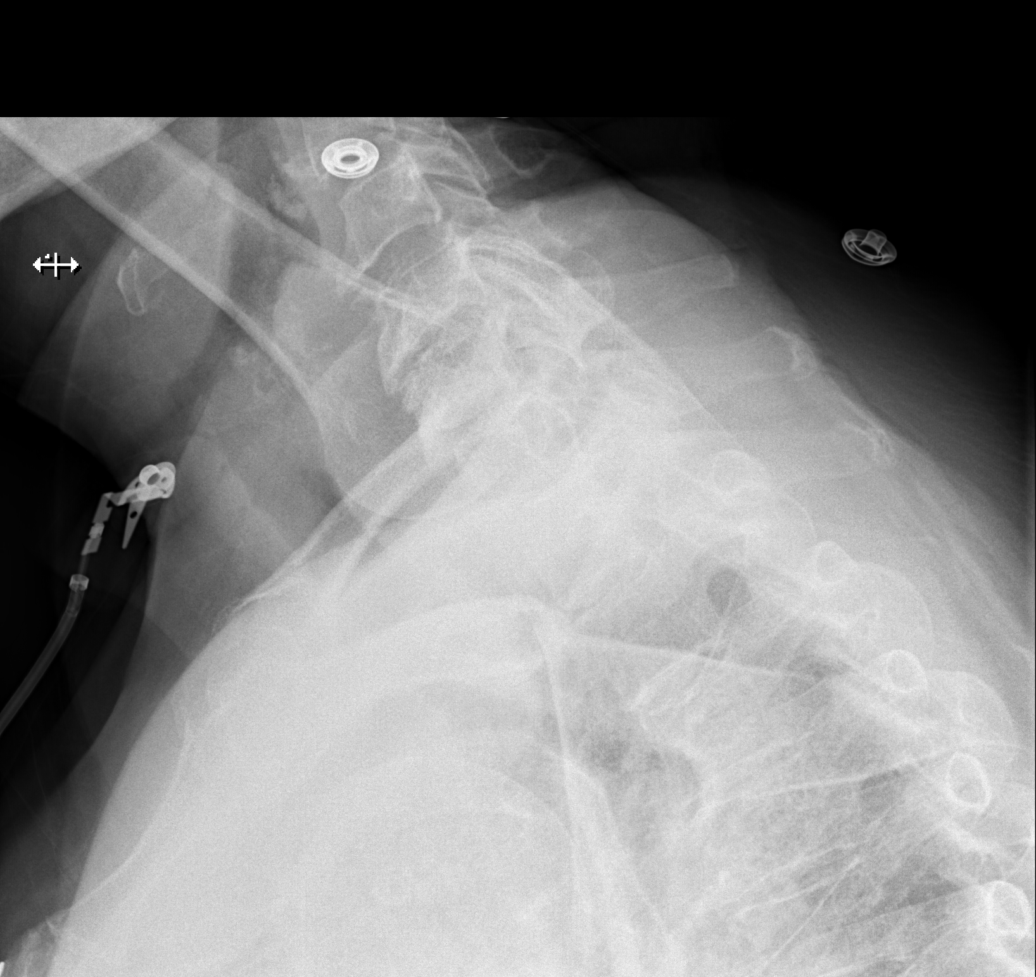
[im 4/5]
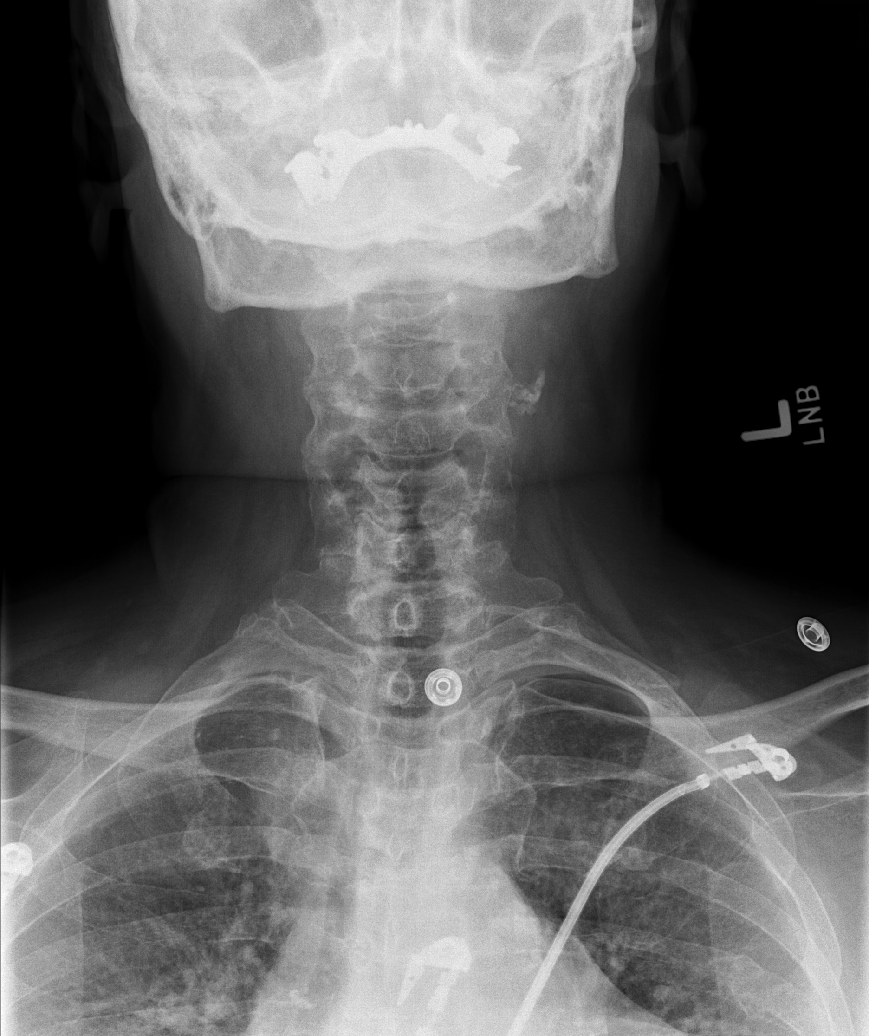
[im 5/5]
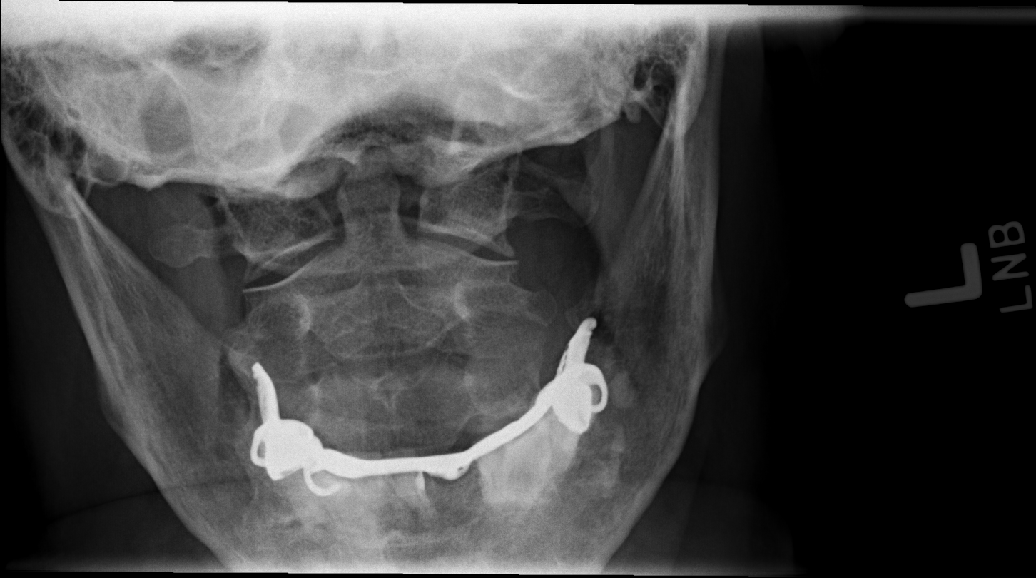

[5 of 5 positions shown; findings below may reference images not displayed]

PROCEDURE:     DXR - DXR C- SPINE AP AND LATERAL  - May 06, 2012  [DATE]

RESULT:     Three views of the cervical spine reveal the vertebral bodies to
be preserved in height. Considerable degenerative disc change is noted at
C4-C5 and C5-C6 and at C6-C7. Large anterior osteophytes are present. There
is no evidence of a perched facet. The spinous processes are intact. The
odontoid is intact.
IMPRESSION: There are degenerative changes of the mid and lower
cervical discs. Incidental note is made of calcification in the region of
the left carotid bulb.

[REDACTED]

## 2014-08-29 IMAGING — CT CT CHEST W/ CM
1 of 2 series · 14 of 32 positions shown, 18 images · IV contrast (APPLIED)
Comparison: None

REASON FOR EXAM: left chest pain dyspnea
COMMENTS:

PROCEDURE:     CT  - CT CHEST (FOR PE) W  - May 06, 2012  [DATE]
RESULT:     Indications: Chest Pain
TECHNIQUE: A thin-section spiral CT from the lung apices to the upper
abdomen was acquired on a multi slice scanner following 100ml [id number]
intravenous contrast. These images were then transferred to the Siemens work
station and were subsequently reviewed utilizing 3-D reconstructions and MIP
images.

[Series 5: lung windows · axial · 0.67mm/px · z∈[-150,+78]mm · 14 of 90 slices shown, 18 images]
[im 7/90  mediastinal]
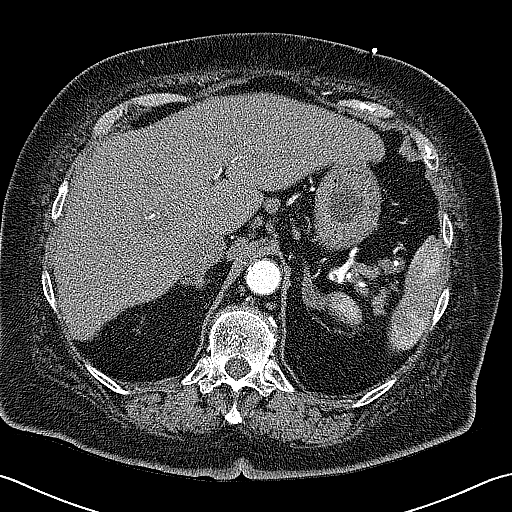
[im 7/90  lung]
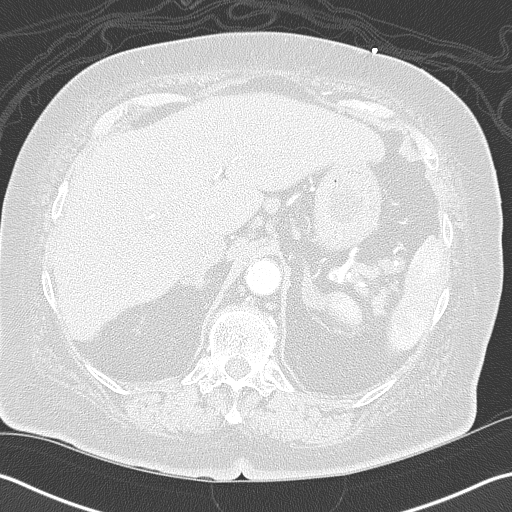
[im 14/90  lung]
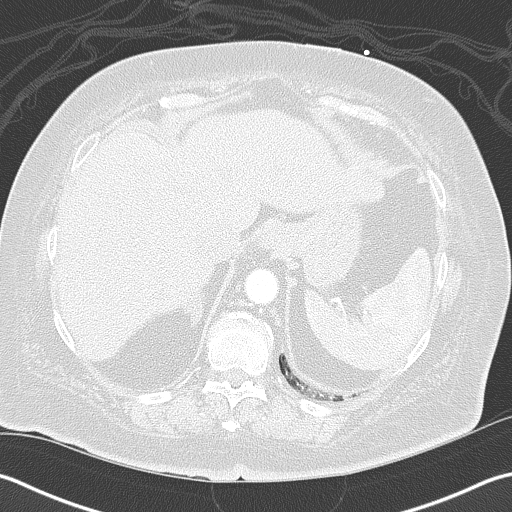
[im 21/90  lung]
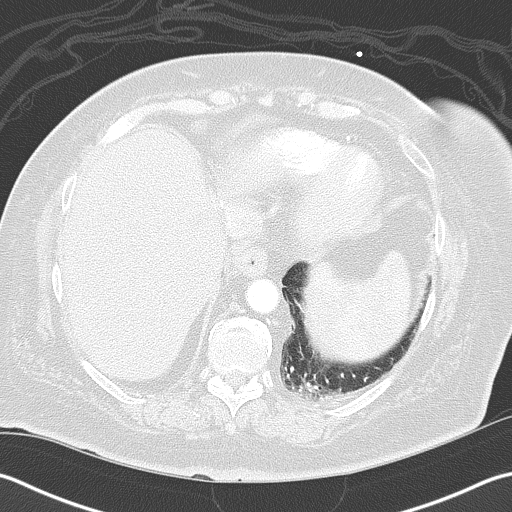
[im 28/90  lung]
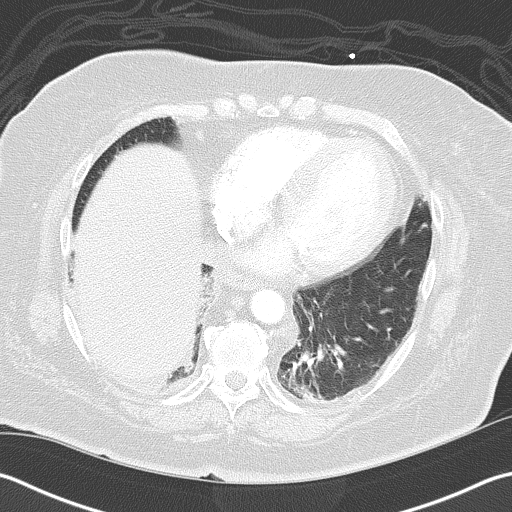
[im 35/90  mediastinal]
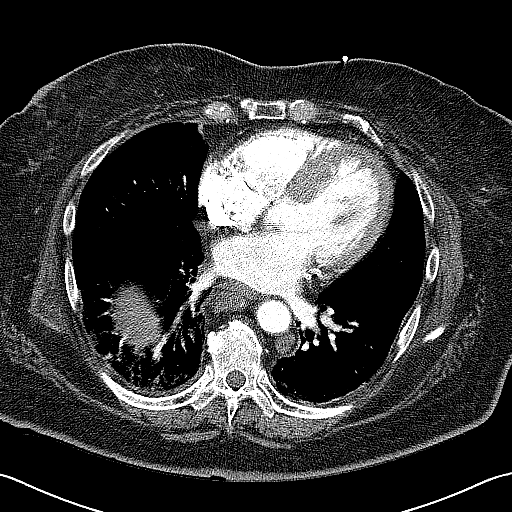
[im 35/90  lung]
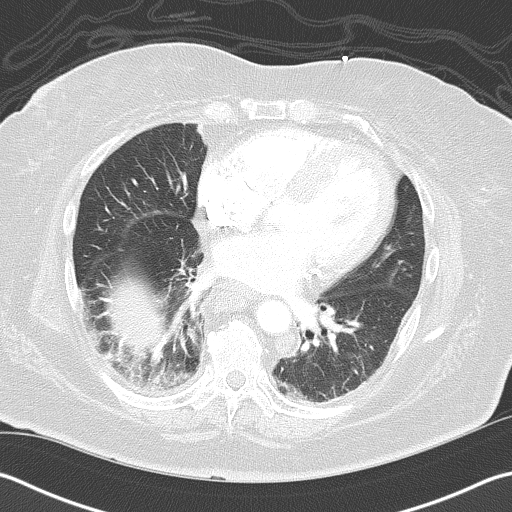
[im 41/90  lung]
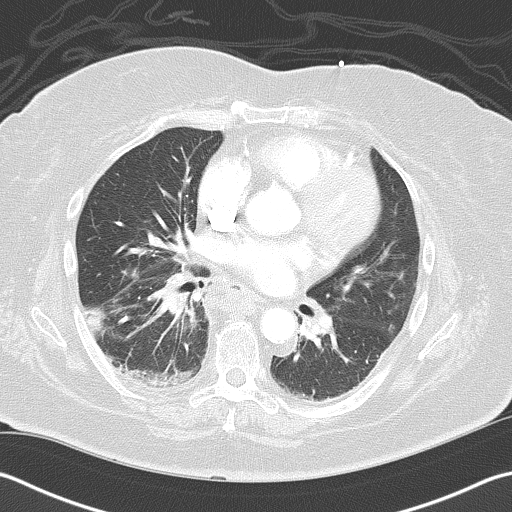
[im 42/90  lung]
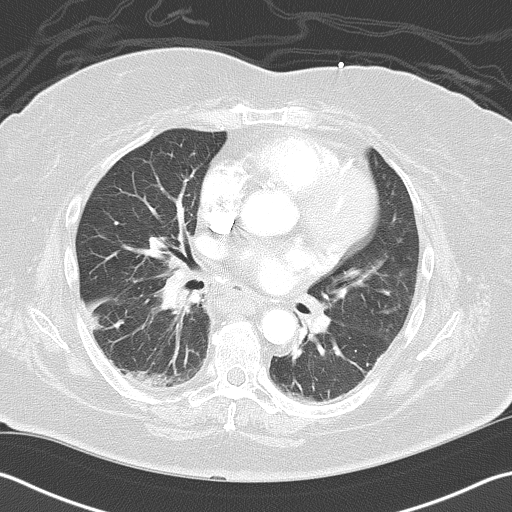
[im 45/90  lung]
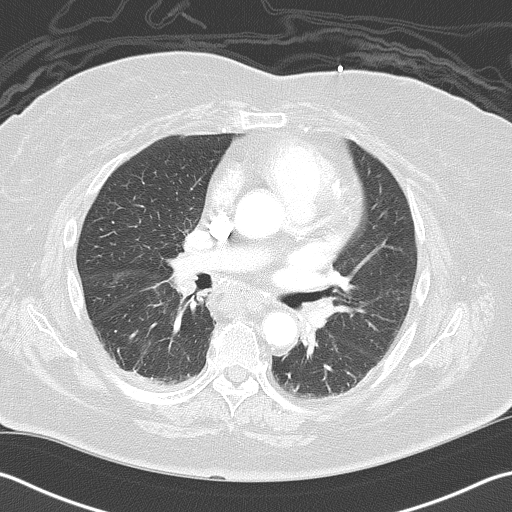
[im 48/90  mediastinal]
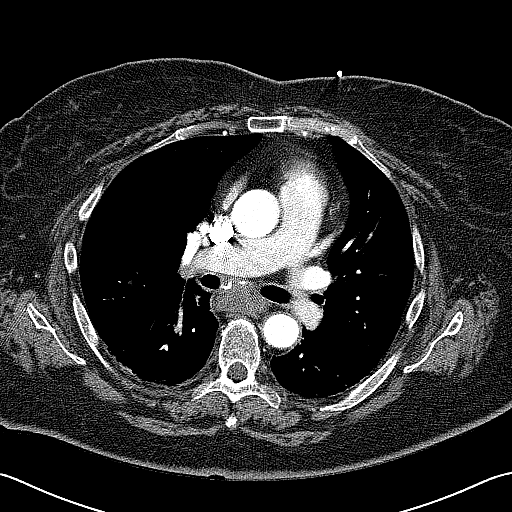
[im 48/90  lung]
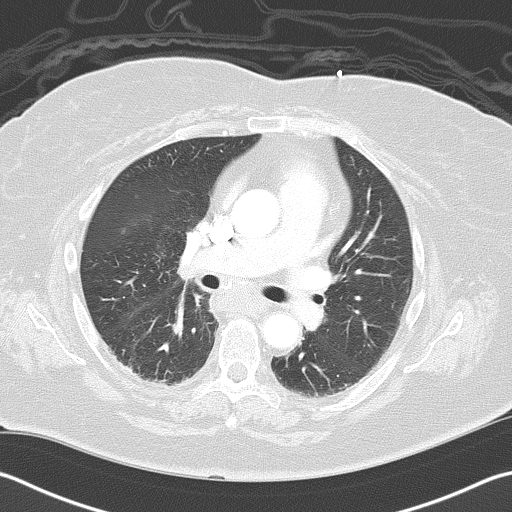
[im 55/90  lung]
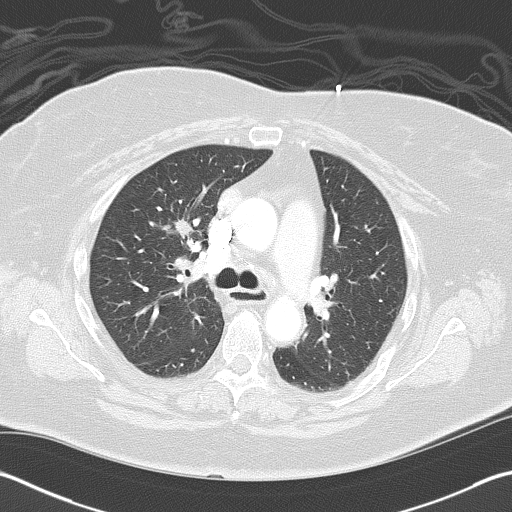
[im 62/90  lung]
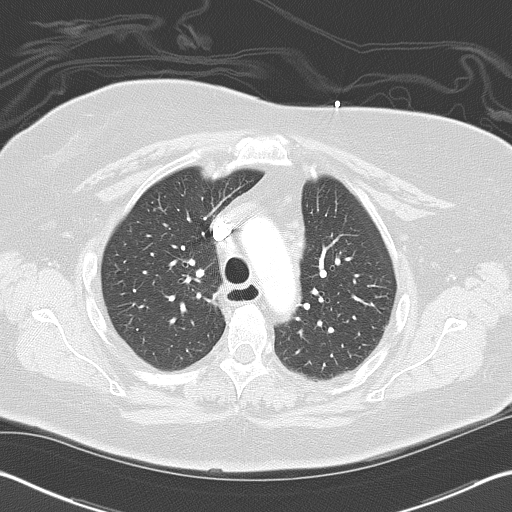
[im 69/90  lung]
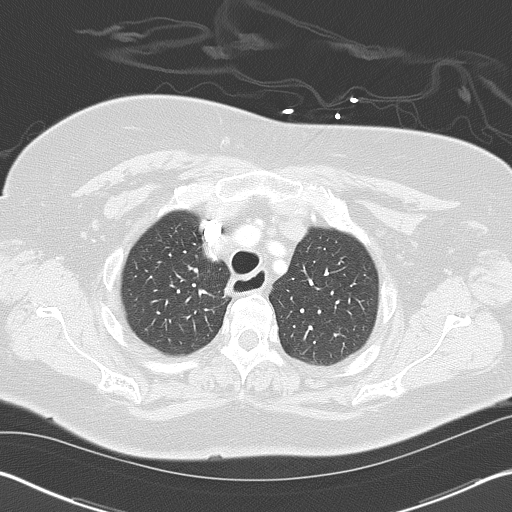
[im 76/90  mediastinal]
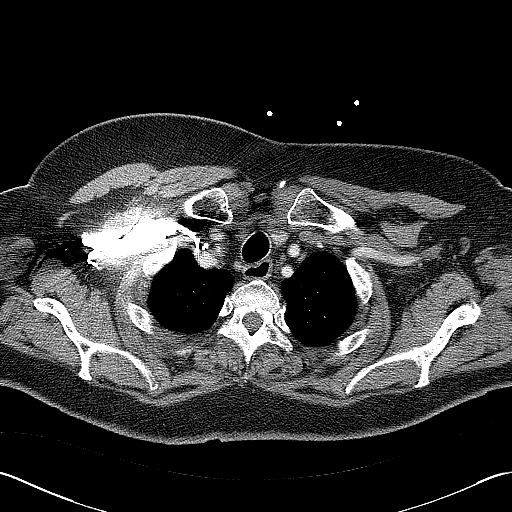
[im 76/90  lung]
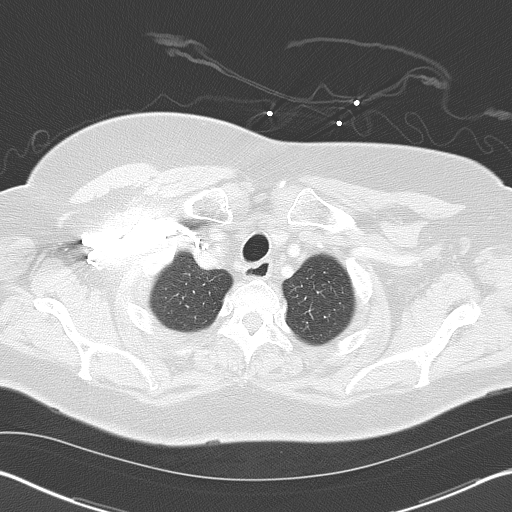
[im 83/90  lung]
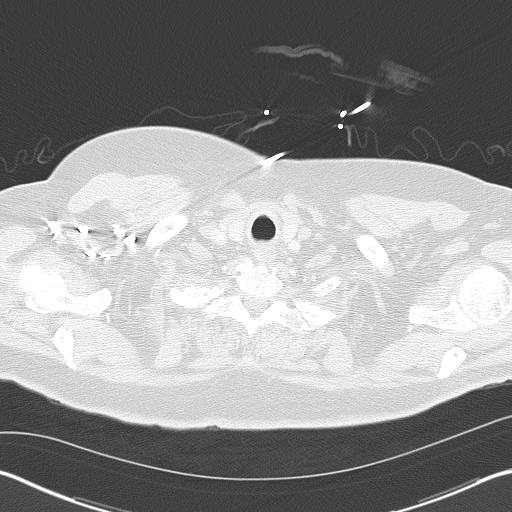

[14 of 32 positions shown; findings below may reference images not displayed]

FINDINGS: There is adequate opacification of the pulmonary arteries. There is no
pulmonary embolus. The main pulmonary artery, right main pulmonary artery,
and left main pulmonary arteries are normal in size. The heart size is
normal. There is no pericardial effusion.

There is right basilar airspace disease which may reflect atelectasis versus
developing pneumonia. There is a 11 mm right upper lobe ulnar nodule
adjacent to the hilum. There is no pleural effusion or pneumothorax.

There are calcified mediastinal and bilateral hilar lymph nodes likely
resenting sequela prior granulomatous disease. There multiple enlarged lymph
nodes adjacent to the right descending thoracic aorta with the largest
measuring 17 mm in short axis. There are mildly enlarged short gastric lymph
nodes. There is a large subcarinal lymph node measuring 11 mm in short axis.

The osseous structures are unremarkable.

The visualized portions of the upper abdomen are unremarkable.
IMPRESSION: 1. No CT evidence of pulmonary embolus.

2. Right lower lobe airspace disease concerning for atelectasis versus
pneumonia. Recommend followup radiography to document complete resolution
following adequate medical therapy. If there is not complete resolution,
then recommend further evaluation with CT of the chest to exclude underlying
pathology.

3. There is an 11 mm right upper lobe pulmonary nodule adjacent to the
pulmonary hilum. Recommend further evaluation with a followup CT chest in 3
months versus a PET/CT.

4. There multiple enlarged lymph nodes adjacent to the right descending
thoracic aorta with the largest measuring 17 mm in short axis. There are
mildly enlarged short gastric lymph nodes. These are of uncertain etiology
and followup is recommended to assess for stability. Differential
considerations include reactive lymph nodes versus lymphoma versus sequela
of prior granulomatous disease given the other partially calcified
mediastinal and hilar lymph nodes, but these do not have any calcification
within them.

[REDACTED]

## 2014-08-29 MED ORDER — MELOXICAM 15 MG PO TABS: 15.0000 mg | ORAL_TABLET | Freq: Every day | ORAL | Status: DC

## 2014-08-29 NOTE — Discharge Summary (Signed)
PATIENT NAME:  Pamela Collier, Pamela Collier MR#:  [id number] DATE OF BIRTH:  12-Jun-1940  DATE OF ADMISSION:  03/18/2012 DATE OF DISCHARGE:  03/21/2012  ADMITTING DIAGNOSIS: Shortness of breath and cough.  DISCHARGE DIAGNOSES:  1. Acute respiratory failure, likely due to acute diastolic congestive heart failure exacerbation as well as possible pneumonia/bronchitis.  2. Diabetes with poorly controlled blood sugars during hospitalization due to steroid treatment.  3. Hypertension.  4. Hyperlipidemia.  5. Coronary artery disease.  6. Osteoarthritis.  7. Osteoporosis.  8. Status post cholecystectomy.  9. Status post lysis of adhesions.  10. Status post hysterectomy.  11. Cataract surgery.   CONSULTANTS: None.   PERTINENT LABORATORY AND EVALUATIONS: Admitting glucose 72, BUN 13, creatinine 0.72, sodium 142, potassium 3.4, chloride 106, CO2 26, and calcium 8.5. LFTs were normal with albumin of 2.9. WBC count 9.5, hemoglobin and hematocrit were 11.2 and 34, and platelet count 419. Troponin was negative. Magnesium was 1.7. BNP was 2881.   Chest x-ray showed bilateral infiltrates consistent with possible congestive heart failure.   EKG showed sinus rhythm with incomplete right bundle branch block, left axis deviation.   Echocardiogram showed left ventricular hypertrophy and ejection fraction was 65%, mild mitral regurgitation, and mild tricuspid regurgitation.  HOSPITAL COURSE: Please refer to the history and physical done by the admitting physician. The patient is a 74 year old white female who has been having shortness of breath for the past few days. She also was having coughing. The patient was treated with Zithromax and prednisone taper and Lasix in the office; however, she did not improve. She came to the ED with these symptoms. Chest x-ray suggested possible congestive heart failure. The patient also was having symptoms of possible acute bronchitis as well. She was started on treatment with Levaquin and  IV Lasix with significant improvement in her symptoms. The patient was diuresed well with Lasix and she is doing much better. She is being arranged home health due to trouble with getting her medications straight. The patient also has a tremor and some abnormal movements of her head and we will be referring her to neurology for outpatient evaluation. At this time, she is doing much better and is stable for discharge.   DISCHARGE INSTRUCTIONS: The patient is to weigh herself every day at the same time, preferably first thing in the morning after urinating and before eating. Call physician if gain of more than 2 pounds in one day or 5 pounds in one week for increased weight gain, tiredness, swelling of the legs or feet, shortness of breath, urinating less, and feeling faint, or chest pain or tightness call 911.   DISCHARGE MEDICATIONS:  1. Metformin 1000 mg p.o. twice a day. 2. Aspirin 81 mg one tab p.o. daily.  3. Enalapril 20 mg daily.  4. Metoprolol tartrate 25 mg one tab p.o. twice a day. 5. Crestor 10 mg at bedtime.  6. Boniva 150 mg daily.  7. Amlodipine 10 mg daily.  8. Lyrica 100 mg one tab p.o. three times daily. 9. Lantus 24 units at bedtime.  10. Lantus 30 units in the morning.  11. Lasix 20 mg 1 tab p.o. twice a day. 12. Prednisone taper start at 60 mg and taper by 10 mg until complete.  13. Tylenol 650 mg every four hours p.r.n. for pain or temperature.  14. Levaquin 500 p.o. daily x4 days.  15. MiraLax 17 grams daily.  16. Combivent 1 puff four times daily as needed for wheezing.  17. Home health is  being arranged.   HOME OXYGEN: None.   DIET: Low sodium, low fat, low cholesterol, carbohydrate consistent diet. Diet consistency regular.   ACTIVITY: As tolerated with rolling walker. Follow-up Dr. Derrel Nip in 1 to 2 weeks. Followup with [facility] for tremor and abnormal movements of the head, with the next available. Followup with Dr. Vickki Muff in 6 to 7 days.  TIME  SPENT: 45 minutes spent. ____________________________ Lafonda Mosses. Posey Pronto, MD shp:slb D: 03/21/2012 08:42:13 ET T: 03/22/2012 11:26:07 ET JOB#: [id number]  cc: Odyssey Vasbinder H. Posey Pronto, MD, <Dictator> Deborra Medina, MD Alric Seton MD ELECTRONICALLY SIGNED 03/25/2012 13:28

## 2014-08-29 NOTE — Telephone Encounter (Signed)
Ok to refill,  Refill sent  To [facility] in [place]

## 2014-08-29 NOTE — H&P (Signed)
PATIENT NAME:  Pamela Collier, Pamela Collier MR#:  [id number] DATE OF BIRTH:  02-Jan-1941  DATE OF ADMISSION:  03/18/2012  PRIMARY CARE PHYSICIAN: Dr. Derrel Nip  CARDIOLOGIST: Dr. Clayborn Bigness  CHIEF COMPLAINT: Shortness of breath.   HISTORY OF PRESENT ILLNESS: This is a 74 year old female who has been having symptoms since Saturday where she is having trouble breathing. She cannot sleep very well, some gurgling in the throat, some coughing. She went to see Dr. Derrel Nip on Monday. She gave her three new medications; Zithromax, enalapril and a prednisone taper and a shot of Lasix in the office and a breathing treatment. Symptoms still progressed. She has a subjective fever at home. In the ER, ER physician documented a pulse oximetry of 89% on room air and hospitalist services were contacted for further evaluation. Chest x-ray was read as atelectasis versus infiltrate and a component of pulmonary vascular congestion.   PAST MEDICAL HISTORY:  1. Hypertension. 2. Diabetes. 3. Hyperlipidemia. 4. Coronary artery disease. 5. Systolic congestive heart failure. 6. Arthritis. 7. Osteoporosis.   PAST SURGICAL HISTORY:  8. Cholecystectomy.  9. Lysis of adhesions. 10. Hysterectomy. 11. Cataract surgery.  12. Other surgeries as a child that she does not remember.   ALLERGIES: Sulfa.    MEDICATIONS: Medications that she is taking at home include:  1. Amlodipine 10 mg daily.  2. Aspirin 81 mg daily.  3. Zithromax recently started. 4. Boniva once a month 150 mg. 5. Crestor 10 mg at bedtime.  6. Enalapril 20 mg daily.  7. Lasix 20 mg daily. 8. Lantus 30 units in the morning, 24 units at night.  9. Lyrica 100 mg 3 times a day. 10. Metformin 1000 mg twice a day. 11. Metoprolol 25 mg twice a day. 12. Prednisone taper.   SOCIAL HISTORY: No smoking, alcohol, or drug use. Is on disability.   FAMILY HISTORY: Father died of heart disease, unknown age. Mother living with heart disease. Sister died at 82 of a heart attack.  Brother died in his 44s of a heart attack.   REVIEW OF SYSTEMS: CONSTITUTIONAL: Positive for subjective fever. Positive for hot feeling. No chills or sweats. Positive for weight gain, 4 pounds. Positive for weakness. EYES: No eye issues. EARS, NOSE, MOUTH, AND THROAT: Positive for postnasal drip. Positive for slight dysphagia. CARDIOVASCULAR: Positive for chest pain. No palpitations. RESPIRATORY: Positive for shortness breath. Positive for cough. Positive for wheeze. No hemoptysis. GASTROINTESTINAL: No nausea. No vomiting. Positive for abdominal pain. Positive for diarrhea this a.m. MUSCULOSKELETAL: Positive for pain all over, left calf pain which is chronic with neuropathy. NEUROLOGIC: Positive for lightheadedness. PSYCHIATRIC: No anxiety or depression. ENDOCRINE: No thyroid problems. HEMATOLOGIC/LYMPHATIC: No anemia. No easy bruising or bleeding. INTEGUMENT: No rashes.   PHYSICAL EXAMINATION:  VITAL SIGNS: Temperature 98.4, pulse 71, respirations 24, blood pressure 184/72, pulse oximetry as per ER physician 89% on room air.   GENERAL: No respiratory distress but talking is interrupted with breathing.   EYES: Conjunctivae and lids normal. Pupils equal, round, and reactive to light. Extraocular muscles intact. No nystagmus.   EARS, NOSE, MOUTH, AND THROAT: Tympanic membranes no erythema. Nasal mucosa no erythema. Throat no erythema. No exudate seen. Lips and gums no lesions.   NECK: No JVD. No bruits. No lymphadenopathy. No thyromegaly. No thyroid nodules palpated.   RESPIRATORY: Decreased breath sounds bilaterally. No use of accessory muscles to breathe, rales bilateral bases, slight upper airway wheeze and poor air entry.   CARDIOVASCULAR: S1, S2 normal. No gallops, rubs, or murmurs heard. Carotid  upstroke 2+ bilaterally. No bruits.   EXTREMITIES: Dorsalis pedis pulses 2+ bilaterally. No edema of the lower extremity.   ABDOMEN: Soft. Slight tenderness throughout. No organomegaly/splenomegaly.  Normoactive bowel sounds. No masses felt.   LYMPHATIC: No lymph nodes in the neck.   MUSCULOSKELETAL: No clubbing. Trace edema. No cyanosis.   SKIN: No rashes or ulcers seen.   NEUROLOGIC: Cranial nerves II through XII grossly intact. Deep tendon reflexes 2+ bilateral lower extremities.   PSYCHIATRIC: Patient is oriented to person, place, and time.   LABORATORY, DIAGNOSTIC AND RADIOLOGICAL DATA: Glucose 72, BUN 13, creatinine 0.72, sodium 142, potassium 3.4, chloride 106, CO2 26, calcium 8.5. Liver function tests normal. Albumin low at 2.9. White blood cell count 9.5, hemoglobin and hematocrit 11.2 and 34.0, platelet count 419. Troponin negative. Magnesium 1.7. BNP 2881. Chest x-ray shallow inspiration, atelectasis versus infiltrate in the lung bases, pulmonary vascular congestion cannot be ruled out. EKG showed sinus rhythm, 77 beats per minute, incomplete right bundle branch block, left axis deviation.     ASSESSMENT AND PLAN:  1. Acute respiratory failure with pulse oximetry 89% on room air documented by the ER physician only. Will give oxygen supplementation.  2. Acute congestive heart failure, history of systolic congestive heart failure in the past. Will get an echocardiogram. Lasix 40 mg IV b.i.d. Continue metoprolol and enalapril. Continue to monitor clinically.  3. Pneumonia versus bronchitis. Will switch the patient's Zithromax to Levaquin. DuoNeb nebulizer solution and start Solu-Medrol 40 mg IV q.8 hours. Continue to monitor respiratory status.  4. Diabetes. Continue Lantus and do sliding scale.  5. Hypertension. Blood pressure elevated now. Will continue current medications and continue to monitor.  6. Hyperlipidemia. Continue Crestor.  7. Osteoporosis. Can do the [facility] as outpatient.   TIME SPENT ON ADMISSION: 55 minutes.   CODE STATUS: Patient is a FULL CODE.   ____________________________ Tana Conch. Leslye Peer, MD rjw:cms D: 03/18/2012 09:52:35 ET T: 03/18/2012 10:08:32  ET JOB#: [id number]  cc: Tana Conch. Leslye Peer, MD, <Dictator> Deborra Medina, MD Marisue Brooklyn MD ELECTRONICALLY SIGNED 03/20/2012 20:22

## 2014-08-29 NOTE — H&P (Signed)
PATIENT NAME:  Pamela Collier, OBRION MR#:  [id number] DATE OF BIRTH:  05/29/1940  DATE OF ADMISSION:  05/06/2012  PRIMARY CARE PHYSICIAN: Dr. Derrel Nip.   CHIEF COMPLAINT: Left neck pain radiating down to the left arm.   HISTORY OF PRESENTING ILLNESS: This 74 year old female patient with history of coronary artery disease, hypertension, diabetes, last cardiac catheterization in January 2013, presents to the Emergency Room complaining of left neck pain and arm pain. She has some chronic shortness of breath, which is unchanged. Has on and off left-sided chest pain, which is unchanged. In the Emergency Room, the patient has been admitted by the ER physician for ruling out acute coronary syndrome after discussing with her cardiologist, Dr. Clayborn Bigness. The patient's first set of cardiac enzymes are normal. 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   PAST MEDICAL HISTORY: Hypertension, diabetes, hyperlipidemia, systolic congestive heart failure, arthritis, osteoporosis, coronary artery disease with last catheterization in January 2013 which showed diffuse severe CAD of RCA, moderate disease on left and an EF of 35% to 40% with global hypokinesis. Therapy with Ranexa was advised by Dr. Clayborn Bigness.   PAST SURGICAL HISTORY: Cholecystectomy, lysis of adhesions, hysterectomy, cataract surgeries.   ALLERGIES: SULFA.   SOCIAL HISTORY: The patient does not smoke. No alcohol. No illicit drugs. She is on disability.   FAMILY HISTORY: Father died of heart disease at unknown age. Mother is living with heart disease.   HOME MEDICATIONS:  1.  Acetaminophen 650 mg oral every 4 hours as needed for pain, fever.  2.  Amlodipine 10 mg oral once a day.  3.  Aspirin 81 mg oral once a day. 4.  Boniva 150 mg oral once a month.  5.  Combivent 1 puff inhaled 4 times a day as needed.  6.  Crestor 10 mg oral once a day.  7.  Enalapril 20 mg oral once a day.  8.  Lasix 20 mg oral once a day.  9.  Lantus  24 units subcutaneous once a day at night and 30 units in the morning. 10.  Lyrica 100 mg oral 2 times a day.  11.  Metformin 1000 mg oral 2 times a day.  12.  Metoprolol tartrate 25 mg oral 2 times a day.   REVIEW OF SYSTEMS:  CONSTITUTIONAL: No fever, fatigue or weakness or weight loss.  EYES: No blurred vision, redness, pain.  ENT: No tinnitus, hearing loss, sinus congestion.  CARDIOVASCULAR: On and off chest pains chronically, follows with Dr. Clayborn Bigness.  RESPIRATORY: No cough, wheezing, hemoptysis.  GENITOURINARY: No dysuria, hematuria.  GASTROINTESTINAL: No nausea, vomiting, diarrhea.  MUSCULOSKELETAL: Has arthritis in multiple joints along with osteoporosis. Presently has left-sided neck pain radiating to the left arm.  HEMATOLOGIC, LYMPHATIC: No anemia, easy bruising, bleeding.  ENDOCRINE: No hypothyroidism.  NEUROLOGIC: No focal weakness, numbness, dysarthria. Has tingling in the left arm.  SKIN: No rash or lesions.   PHYSICAL EXAMINATION:  VITAL SIGNS: Temperature 97.7, pulse 70, respirations 18, blood pressure 158/65, saturating 97% on room air.  GENERAL: Moderately built female patient lying in bed, comfortable, conversational, cooperative with exam.  PSYCHIATRIC: Alert, oriented x 3. Mood and affect appropriate. Judgment intact.  HEENT: Atraumatic, normocephalic. Oral mucosa moist and pink. External ears and nose normal. No pallor. No icterus. Pupils bilaterally equal and reactive to light.  NECK: Supple. No thyromegaly. No palpable lymph nodes. Trachea midline. No carotid bruits, JVD.  CARDIOVASCULAR: S1 and S2, regular rate and rhythm without any murmurs.  RESPIRATORY:  Normal work of breathing. Clear to auscultation on both sides.  GASTROINTESTINAL: Soft abdomen, nontender. Bowel sounds present. No hepatosplenomegaly palpable.  SKIN: Warm and dry. No petechiae, rash, ulcers.  NEUROLOGICAL: Motor strength 5/5 in upper and lower extremities. Sensation to fine touch intact all  over.  MUSCULOSKELETAL: Has pain and tenderness in the left neck area.   LABORATORY STUDIES: Show glucose 175, BUN 19, creatinine 1.06. Troponin less than 0.02 x 2. WBC 6.7, hemoglobin 11.7. D-dimer 0.62 with CT of the chest for pulmonary embolism showing no PE.   EKG shows right bundle branch block, left anterior fascicular block.   ASSESSMENT AND PLAN:  1.  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 The patient does have arthritis, osteoporosis. Will get an x-ray of the neck to look for any fractures. We will start the patient on some pain medication and will have her follow up with primary care physician.  2.  Chronic chest pain. This is unchanged. The patient had a catheterization in January. Dr. Clayborn Bigness is on the case. The patient will follow up as outpatient with him.  3.  Hypertension, well controlled.  4.  Coronary artery disease, stable.  5.  Deep venous thrombosis prophylaxis with Lovenox.   CODE STATUS: Full code.   TIME SPENT: Time spent today on this case was more than 75 minutes.   ____________________________ Leia Alf Saburo Luger, MD srs:jm D: 05/06/2012 13:43:26 ET T: 05/06/2012 14:28:20 ET JOB#: [id number]  cc: Alveta Heimlich R. Eveline Sauve, MD, <Dictator> Neita Carp MD ELECTRONICALLY SIGNED 05/06/2012 19:20

## 2014-09-01 NOTE — H&P (Signed)
PATIENT NAME:  Pamela Collier, DOMINSKI MR#:  [id number] DATE OF BIRTH:  [birth year]  DATE OF ADMISSION:  09/06/2012  PRIMARY CARE PHYSICIAN: Dr. Derrel Nip.   CHIEF COMPLAINT: Shortness of breath.   HISTORY OF PRESENT ILLNESS: This is a 74 year old female who presents to the Emergency Room sent over from her primary care physician's office due to progressive shortness of breath, getting worse in the past week. The patient says that she gets short of breath even on minimal exertion getting from room to room in her house. The patient also says that at night when she tries to sleep, usually on 2 pillows, she wakes up multiple times wheezing and feeling short of breath. She went to see her primary care physician today, who then referred her to the ER for further evaluation. In the Emergency Room, the patient was noted to have an elevated BNP and chest x-ray findings consistent with bilateral pleural effusions and congestive heart failure. Hospitalist services were contacted for further treatment and evaluation. The patient denies any weight gain, admits to a cough which is nonproductive. No fevers, no chills, no nausea, no vomiting, no diarrhea. No other associated symptoms presently.   REVIEW OF SYSTEMS:  CONSTITUTIONAL: No documented fever. No weight gain or weight loss.  EYES: No blurry or double vision.  ENT: No tinnitus. No postnasal drip. No redness of the oropharynx.  RESPIRATORY: Positive cough. Positive wheeze. No hemoptysis. Positive dyspnea on exertion.  CARDIOVASCULAR: No chest pain, no orthopnea, no palpitations, no syncope.  GASTROINTESTINAL: No nausea, no vomiting, no diarrhea. No abdominal pain, no melena or hematochezia.  GENITOURINARY: No dysuria or hematuria.  ENDOCRINE: No polyuria or nocturia. No heat or cold intolerance. HEMATOLOGIC: No anemia. No bruising. No bleeding.  INTEGUMENTARY: No rashes. No lesions.  MUSCULOSKELETAL: No arthritis. No swelling, no gout.  NEUROLOGIC: No numbness or  tingling. No ataxia. No seizure-type activity.  PSYCHIATRIC: No anxiety. No insomnia. No ADD.   PAST MEDICAL HISTORY: Consistent with hypertension, diabetes, hyperlipidemia, history of diastolic congestive heart failure. History of diabetic neuropathy.   ALLERGIES: SULFA DRUGS WHICH CAUSES HIVES.   SOCIAL HISTORY: No smoking. No alcohol abuse. No illicit drug abuse. Lives at home with her husband.   FAMILY HISTORY: Mother is alive at age 19. Father died from complications of heart disease. The patient has strong family history of heart disease.   CURRENT MEDICATIONS: Tylenol 650 every 4 hours as needed, Tylenol with tramadol 325/37.5 one tab q.6 hours as needed, albuterol ipratropium 1 puff q.6 hours, Aleve 220 mg q.8. hours as needed, aspirin 81 mg daily, Benicar 40 mg 1/2 tab daily, Crestor 10 mg daily, Lasix 20 mg daily, ibandronate 150 mg monthly, Lunesta 2 mg at bedtime, Lyrica 100 mg t.i.d., metformin 1000 mg b.i.d., metoprolol tartrate 25 mg b.i.d., NovoLog 70/30, 27 units in the morning and 30 units in the evening.   PHYSICAL EXAMINATION ON ADMISSION:  As follows: VITAL SIGNS:  Temperature 98.1, pulse 78, respirations 18, blood pressure 189/68, sats 94% on 2 liters nasal cannula.  GENERAL: She is a pleasant-appearing female in mild respiratory distress.  HEAD, EYES, EARS, NOSE AND THROAT EXAM: Atraumatic, normocephalic. Extraocular muscles are intact. Pupils are equal and reactive to light. Sclerae anicteric. No conjunctival injection. No pharyngeal erythema.  NECK: Supple. There is no jugular venous distention. No bruits, no lymphadenopathy or thyromegaly.  HEART: Regular rate and rhythm. No murmurs, rubs or clicks.  LUNGS: She has some bibasilar crackles but negative use of accessory muscles, no dullness  to percussion. Good air entry bilaterally.  ABDOMEN: Soft, flat, nontender, nondistended. Has good bowel sounds. No hepatosplenomegaly appreciated.  EXTREMITIES: No evidence of any  cyanosis, clubbing or peripheral edema. Has +2 pedal and radial pulses bilaterally.  NEUROLOGICAL: The patient is alert, awake and oriented x 3 with no focal motor or sensory deficits appreciated bilaterally.  SKIN: Moist and warm with no rashes appreciated.  LYMPHATIC: There is no cervical or axillary lymphadenopathy.   LABORATORY DATA:  Serum glucose of 142, BUN 9, creatinine 0.6, sodium 142, potassium 3.4, chloride 105, bicarb 28. Troponin 0.04. Albumin of 3.1. White cell count 6.5, hemoglobin 13.1, hematocrit 38.2, platelet count of 334. INR is 1.1.   The patient did have a chest x-ray done which showed worsening CHF with a large pleural effusion on the right, small amount of  pleural fluid in the left hemithorax.   ASSESSMENT AND PLAN: This is a 74 year old female with history of hypertension, diabetes, hyperlipidemia, osteoporosis, history of congestive heart failure, diabetic neuropathy, who presents to the hospital with shortness of breath on exertion, progressively getting worse.  1.  Acute congestive heart failure.  This is the likely cause of the patient's shortness of breath. This is likely acute on chronic congestive heart failure. The patient had an echocardiogram done in November of 2013 which showed an ejection fraction of 65%; therefore, this is likely diastolic dysfunction. I will go ahead and diurese her with IV Lasix, follow Is and Os and daily weights. I will continue her metoprolol, continue her Benicar for now. I will cycle her cardiac markers, her first set of troponins is negative. We will repeat a 2-dimensional echocardiogram and get a cardiology consult with Dr. Clayborn Bigness, who is her cardiologist.  2.  Hypertension. The patient had some significantly elevated blood pressures when she presented to the hospital although they have improved since then. Systolic blood pressures were over 200, now down to as high as 170s to 180s. I will continue her metoprolol and Benicar for now, ill  also use some p.r.n. hydralazine if needed.  3.  Hyperlipidemia. I will continue her Crestor.  4.  Diabetes. Continue with her NovoLog 70/30 and metformin. Check a hemoglobin A1c. Continue carb-controlled diet.  5.  Diabetic neuropathy. Continue Lyrica.  6.  CODE STATUS: The patient is a full code.   TIME SPENT ON ADMISSION: 50 minutes.    ____________________________ Belia Heman. Verdell Carmine, MD vjs:cs D: 09/06/2012 14:35:00 ET T: 09/06/2012 14:55:32 ET JOB#: [id number]  cc: Belia Heman. Verdell Carmine, MD, <Dictator> Henreitta Leber MD ELECTRONICALLY SIGNED 09/07/2012 19:39

## 2014-09-01 NOTE — Discharge Summary (Signed)
PATIENT NAME:  Pamela Collier, Pamela Collier MR#:  [id number] DATE OF BIRTH:  1940/12/19  DATE OF ADMISSION:  05/06/2012 DATE OF DISCHARGE:  05/07/2012  PRIMARY CARE PHYSICIAN: Dr. Derrel Nip.   DISCHARGE DIAGNOSES: 1.  Cervicalgia.  2.  Coronary artery disease.  3.  Hypertension.   IMAGING STUDIES DONE: Included: 1.  CT of the chest for pulmonary embolism which showed no PE.  2.  Chest x-ray which showed no acute intracardiac abnormalities.  3.  Cervical spine, AP and lateral, showed degenerative changes of mid and lower cervical disks. No acute fractures.  4.  Ultrasound of the lower extremities showed no DVT.    ADMITTING HISTORY AND PHYSICAL: Please see detailed H and P dictated on 05/06/2012. In brief, a 74 year old female patient with history of coronary artery disease, cardiac cath previously in the year, presented to the hospital with complaints of left neck pain radiating to her left arm. The patient has also had chronic anginal chest pain which is unchanged, admitted to the hospitalist service for ruling out acute coronary syndrome. Case was discussed with Dr. Clayborn Bigness in the ER, who suggested ruling out acute coronary syndrome with cardiac enzymes.   HOSPITAL COURSE: 1.  Neck pain. The patient had cervical degenerative disease show up on x-ray which was causing neuropathy in her left arm, was started on pain medications and Neurontin with which her symptoms have resolved well.  2.  Chest pain. The patient has had chronic unchanged angina. Cardiac enzymes have been normal. EKG unchanged. The patient will follow up with Dr. Clayborn Bigness as outpatient. Her home medications are being continued at this time.   On the day of discharge, the patient's temperature is 97.9, pulse 62, blood pressure 147/61, saturating 99% on room air. Her neck pain is significantly decreased and is being discharged home in a fair condition.   DISCHARGE MEDICATIONS: 1.  Aspirin 81 mg oral once a day.  2.  Enalapril 20 mg oral  once a day.  3.  Metoprolol tartrate 25 mg oral 2 times a day.  4.  Crestor 10 mg oral once a day.  5.  Boniva 150 mg oral __________. 6.  Amlodipine 10 mg oral once a day.  7.  Combivent 1 puff inhaled 4 times a day.  8.  Lyrica 100 mg oral capsule 2 times a day.  9.  Lasix 20 mg oral once a day.  10.  Metformin 1000 mg oral 2 times a day.  11.  Lantus 30 units subcutaneous once a day in the morning and 24 units at night.  12.  Ultracet 37.5, 1 tablet oral 3 times a day as needed.  13.  Gabapentin 100 mg oral 2 times a day.   DISCHARGE INSTRUCTIONS: The patient will be on diabetic diet. Activity as tolerated. Follow up with Dr. Derrel Nip and Dr. Clayborn Bigness in 1 to 2 weeks.   TIME SPENT: Time spent today on this discharge dictation was 28 minutes.    ____________________________ Leia Alf Margerite Impastato, MD srs:cs D: 05/07/2012 13:18:00 ET T: 05/09/2012 14:09:51 ET JOB#: [id number]  cc: Alveta Heimlich R. Alisandra Son, MD, <Dictator> Deborra Medina, MD Dwayne D. Clayborn Bigness, MD Neita Carp MD ELECTRONICALLY SIGNED 05/20/2012 15:27

## 2014-09-01 NOTE — Consult Note (Signed)
General Aspect 74 yo female With history of CAD, acute on chronic systolic/diastolic heart failure, with EF 35% in the past presented after several days of weakness PND orthopnea wheezing.  Patient has had hospitalizations for heart failure in the past.  Had been checking her ankles and there was no significant edema and no significant weight gain so she thought she was okay not to present to her doctor.  However she got to the point where she couldn't breathe and presented to the ER.  She's also had elevation of her blood pressure.  She continues to be weak with dyspnea.  No current chest pain.Echocardiogram interpreted this hospitalization shows mixed systolic diastolic heart failure with EF around 40-45%.  Troponins  have been negative. No acute EKG changes.   Physical Exam:  GEN critically ill appearing   HEENT pink conjunctivae, hearing intact to voice   RESP postive use of accessory muscles  Right base decreased   CARD Regular rate and rhythm  Normal, S1, S2  No murmur   ABD denies tenderness  soft   EXTR negative edema   SKIN skin turgor decreased   NEURO cranial nerves intact, motor/sensory function intact   PSYCH A+O to time, place, person, poor insight, lethargic   Review of Systems:  Subjective/Chief Complaint weakness, Dyspnea   General: Fatigue  Trouble sleeping   Respiratory: Short of breath  Wheezing   Cardiovascular: Orthopnea   Review of Systems: All other systems were reviewed and found to be negative   Lab Results: Routine Chem:  28-Apr-14 12:53   Glucose, Serum  142  BUN 9  Creatinine (comp) 0.68  Sodium, Serum 142  Potassium, Serum  3.4  Chloride, Serum 105  CO2, Serum 28  Calcium (Total), Serum 9.3  Anion Gap 9  Osmolality (calc) 284  eGFR (African American) >60  eGFR (Non-African American) >60 (eGFR values <29m/min/1.73 m2 may be an indication of chronic kidney disease (CKD). Calculated eGFR is useful in patients with stable renal  function. The eGFR calculation will not be reliable in acutely ill patients when serum creatinine is changing rapidly. It is not useful in  patients on dialysis. The eGFR calculation may not be applicable to patients at the low and high extremes of body sizes, pregnant women, and vegetarians.)  Hemoglobin A1c ([facility])  8.0 (The American Diabetes Association recommends that a primary goal of therapy should be <7% and that physicians should reevaluate the treatment regimen in patients with HbA1c values consistently >8%.)  Cardiac:  28-Apr-14 12:53   CK, Total  239  CPK-MB, Serum  5.1 (Result(s) reported on 06 Sep 2012 at 01:49PM.)  Troponin I 0.04 (0.00-0.05 0.05 ng/mL or less: NEGATIVE  Repeat testing in 3-6 hrs  if clinically indicated. >0.05 ng/mL: POTENTIAL  MYOCARDIAL INJURY. Repeat  testing in 3-6 hrs if  clinically indicated. NOTE: An increase or decrease  of 30% or more on serial  testing suggests a  clinically important change)  Routine Coag:  28-Apr-14 12:53   Prothrombin 13.9  INR 1.1 (INR reference interval applies to patients on anticoagulant therapy. A single INR therapeutic range for coumarins is not optimal for all indications; however, the suggested range for most indications is 2.0 - 3.0. Exceptions to the INR Reference Range may include: Prosthetic heart valves, acute myocardial infarction, prevention of myocardial infarction, and combinations of aspirin and anticoagulant. The need for a higher or lower target INR must be assessed individually. Reference: The Pharmacology and Management of the Vitamin K  antagonists: the seventh ACCP Conference on Antithrombotic and Thrombolytic Therapy. XBWIO.0355 Sept:126 (3suppl): [id number]. A HCT value >55% may artifactually increase the PT.  In one study,  the increase was an average of 25%. Reference:  "Effect on Routine and Special Coagulation Testing Values of Citrate Anticoagulant Adjustment in Patients with High HCT  Values." American Journal of Clinical Pathology 2006;126:400-405.)  Routine Hem:  28-Apr-14 12:53   WBC (CBC) 6.5  RBC (CBC) 4.40  Hemoglobin (CBC) 13.1  Hematocrit (CBC) 38.2  Platelet Count (CBC) 334 (Result(s) reported on 06 Sep 2012 at 01:39PM.)  MCV 87  MCH 29.8  MCHC 34.4  RDW  14.7   Radiology Results: Cardiology:    28-Apr-14 14:54, Echo Doppler  Echo Doppler   REASON FOR EXAM:      COMMENTS:       PROCEDURE: [place] - ECHO DOPPLER COMPLETE(TRANSTHOR)  - Sep 06 2012  2:54PM     RESULT: Echocardiogram Report    Patient Name:   Pamela Collier Date of Exam: 09/06/2012  Medical Rec #:  [id number]          Custom1:  Date of Birth:  [birth year]      Height:       63.0 in  Patient Age:    74 years        Weight:       183.0 lb  Patient Gender: F               BSA:          1.86 m??    Indications: CHF  Sonographer:    Janalee Dane RCS  Referring Phys: Lenise Arena, E    Summary:   1. Left ventricular ejection fraction, by visual estimation, is 40 to   45%.   2. Mildly decreased global left ventricular systolic function.   3. Moderately increased left ventricular septal thickness.   4. Normal right ventricular size and systolic function.   5. The left atrium is normal in size and structure.   6. The right atrium is normal in size and structure.   7. Structurally normal mitral valve. Normal leaflet excursion. No   evidence of mitral stenosis or significant regurgitation.   8. Structurally normal tricuspid valve without stenosis or prolapse. No   significant tricuspid regurgitation is seen.   9. Mild aortic valve sclerosis without stenosis.  10. Normal aortic valve, without any evidence of aortic stenosis or   insufficiency.  11. The pulmonic valve is trileaflet, without evidence of stenosis or   insufficiency.  12. Mildly increased left ventricular posterior wall thickness.  2D AND M-MODE MEASUREMENTS (normal ranges within parentheses):  Left Ventricle:           Normal  IVSd (2D):      1.44 cm (0.7-1.1)  LVPWd (2D):     1.21 cm (0.7-1.1) Aorta/LA:                  Normal  LVIDd (2D):     4.22 cm (3.4-5.7) Aortic Root (2D): 3.20 cm (2.4-3.7)  LVIDs (2D):     3.35 cm           Left Atrium (2D): 3.80 cm (1.9-4.0)  LV FS (2D):     20.6 %   (>25%)  LV EF (2D):     42.4 %   (>50%)  Right Ventricle:                                    RVd (2D):        8.83 cm  LV SYSTOLIC FUNCTION BY 2D PLANIMETRY (MOD):  EF-A4C View: 44.4 % EF-A2C View: 57.0 % EF-Biplane: 25.4 %  LV DIASTOLIC FUNCTION:  MV Peak E: 0.92 m/s  MV Peak A: 1.00 m/s  E/A Ratio: 0.92  SPECTRAL DOPPLER ANALYSIS (where applicable):    PHYSICIAN INTERPRETATION:  Left Ventricle: The left ventricular internal cavity size was normal. LV   septal wall thickness was moderately increased. LV posterior wall   thickness was mildly increased. Global LV systolic function was mildly   decreased. Left ventricular ejection fraction, by visual estimation, is   40 to 45%.  Right Ventricle: Normal right ventricular size, wall thickness, and     systolic function. The right ventricular size is normal.  Left Atrium: The left atrium is normal in size and structure. The left   atrium is normal in size.  Right Atrium: The right atrium is normal in size and structure. The right   atrium is normal in size.  Pericardium: There is no evidence of pericardial effusion.  Mitral Valve: Structurally normal mitral valve, with normal leaflet   excursion; without any evidence of mitral stenosis or significant   regurgitation. The mitral valve is normal in structure.  Tricuspid Valve: Structurally normal tricuspid valve, with normal leaflet   excursion. The tricuspid valve is normal.  Aortic Valve: The aortic valve is trileaflet and structurally normal,   with normal leaflet excursion; without any evidence of aortic stenosis or   insufficiency. The aortic valve is normal. Mild aortic  valve sclerosis is   present, with no evidence of aortic valve stenosis.  Pulmonic Valve: Structurally normal pulmonic valve, with normal leaflet   excursion. The pulmonic valve is normal.    Tyler MD  Electronically signed by [place] MD  Signature Date/Time: 09/06/2012/5:32:24 PM    *** Final ***    IMPRESSION: .        Verified By: Yolonda Kida, M.D., MD    Sulfa drugs: Itching, Rash  Percocet 7.5/325: Do NOT Use  Vital Signs/Nurse's Notes: **Vital Signs.:   29-Apr-14 07:15  Vital Signs Type Routine  Temperature Source tympanic  Pulse Pulse 71  Respirations Respirations 18  Systolic BP Systolic BP 982  Diastolic BP (mmHg) Diastolic BP (mmHg) 67  Mean BP 99  Pulse Ox % Pulse Ox % 93  Oxygen Delivery Room Air/ 21 %    Impression 1.  Acute on chronic mixed systolic/ diastolic heart failure with EF  by current echo of around 40%, with known CAD hypertension.   Plan 1.  Continue diuresis watching for aggravation of chronic kidney disease. 2.  Continue treatment of hypertension with beta blocker and ACE inhibitor. 3.  Further recommendations per Dr. Nehemiah Massed.   Electronic Signatures: Roderic Palau (NP)  (Signed 29-Apr-14 15:54)  Authored: General Aspect/Present Illness, History and Physical Exam, Review of System, Labs, Radiology, Allergies, Vital Signs/Nurse's Notes, Impression/Plan   Last Updated: 29-Apr-14 15:54 by Roderic Palau (NP)

## 2014-09-01 NOTE — Discharge Summary (Signed)
PATIENT NAME:  Pamela Collier, Pamela Collier MR#:  [id number] DATE OF BIRTH:  1941/05/04  DATE OF ADMISSION:  09/06/2012  DATE OF DISCHARGE:  09/09/2012  For detailed note, please see the History and Physical done on admission.   DIAGNOSES AT DISCHARGE AS FOLLOWS:  1.  Congestive heart failure, likely acute on chronic systolic dysfunction.  2.  Hypertension.  3.  Diabetes.  4.  Hyperlipidemia.   DIET: Patient is being discharged on a low-sodium, low-fat, American Diabetic Association diet.   ACTIVITY:  As tolerated.   FOLLOWUP:  With Dr. Nehemiah Massed in next 1 to 2 weeks. Also follow up with Dr. Derrel Nip in next 1 to 2 weeks.    DISCHARGE MEDICATIONS: NovoLog Flex Pen 70/30, 28 units in the morning, 30 units in the evening. Albuterol ipratropium inhaler 1 puff q. 6 hours. Metformin 1000 mg b.i.d. Lyrica 100 mg t.i.d. Crestor 10 mg daily. Benicar 40 mg 1/2 tab daily. Xanax 0.5 mg at bedtime. Imdur 30 mg daily. Aspirin 81 mg daily. Metoprolol tartrate 50 mg b.i.d. Lasix 20 mg b.i.d. Potassium 10 mEq daily.   [place] COURSE:  Dr. Serafina Royals from Cardiology.   PERTINENT STUDIES DONE DURING THE HOSPITAL COURSE ARE AS FOLLOWS: Chest x-ray done on admission showing, since the previous study, there have developed signs of worsening CHF, with the larger pleural effusion on the right, a small amount of pleural fluid on the left. A 2-dimensional echocardiogram done on 04/28 showing left ventricular ejection fraction to be 40% to 45%, mildly increased left ventricular septal thickness, normal RV size and systolic function, left atrium is normal in size, right atrium, normal in size, structurally normal mitral valve, normal leaflet excursion, normal tricuspid valve without stenosis or prolapse. Mild aortic sclerosis, without stenosis. Normal aortic valve, without evidence of aortic stenosis or insufficiency. Mildly increased left ventricular posterior wall thickness.   HOSPITAL COURSE:  This is a  74 year old female with medical problems as mentioned above, presented to the hospital with shortness of breath and cough, and noted to be in decompensated congestive heart failure.   1.  Congestive heart failure. This was likely the cause of patient's shortness of breath. This was likely acute on chronic systolic dysfunction. The patient's previous echocardiogram showed normal EF, but during this admission a repeat echo showed an EF of 40% to 45%. The patient was diuresed aggressively with IV Lasix, and is about 2 to 3 liters negative since admission. Her clinical symptoms have significantly improved. She was ambulated on room air, did not desaturate. She will continue Lasix at a higher dose, along with beta blockers and ACE inhibitors, and close followup with Cardiology and her primary care physician as an outpatient.   2. Hypertension. The patient remained hemodynamically stable on metoprolol and Benicar, and she will resume that.   3.  Hyperlipidemia. She was maintained on her Crestor, and she will resume that.   4. Diabetes. The patient's blood sugars remained stable in the hospital. She will continue her metformin and Novolin 70/30 insulin.   5.  Diabetic neuropathy. The patient was maintained on Lyrica, and she will resume that too.   The patient was evaluated by Physical Therapy, and thought she would benefit from home health physical therapy and nursing services, which is being arranged for her prior to discharge.   The patient is a FULL CODE.   Time spent is 40 minutes.    ________________ Pamela Heman. Verdell Carmine, MD vjs:mr D: 09/09/2012 16:34:00 ET T: 09/09/2012 20:16:17  ET JOB#: [id number]  cc: Pamela Heman. Verdell Carmine, MD, <Dictator> Corey Skains, MD Deborra Medina, MD   Henreitta Leber MD ELECTRONICALLY SIGNED 09/14/2012 12:50

## 2014-09-02 NOTE — Op Note (Signed)
PATIENT NAME:  Pamela Collier, Pamela Collier MR#:  [id number] DATE OF BIRTH:  01-31-1941  DATE OF PROCEDURE:  01/12/2014  PREOPERATIVE DIAGNOSES:  1.  Peripheral arterial disease with claudication, bilateral lower extremities, left greater than right.  2.  Diabetes.  3.  Coronary disease.  4.  Hypertension.   POSTOPERATIVE DIAGNOSES:  1.  Peripheral arterial disease with claudication, bilateral lower extremities, left greater than right.  2.  Diabetes.  3.  Coronary disease.  4.  Hypertension.   PROCEDURES: 1.  Ultrasound guidance for vascular access, right femoral artery.  2.  Catheter placement to left posterior tibial artery from right femoral approach.  3.  Aortogram and selective left lower extremity angiogram.  4.  Percutaneous transluminal angioplasty of left posterior tibial artery and tibioperoneal trunk with 2.5 mm diameter angioplasty balloon.  5.  Percutaneous transluminal angioplasty of tibioperoneal trunk with 3 mm diameter angioplasty balloon.  6.  StarClose closure device, right femoral artery.   SURGEON: Algernon Huxley, MD    ANESTHESIA: Local with moderate conscious sedation.   ESTIMATED BLOOD LOSS: 25 mL.  CONTRAST USED: 45 mL Visipaque.   INDICATION FOR PROCEDURE: A 74 year old African American female with multiple medical comorbidities with worsening leg pain with activity.  She had moderately reduced digital pressures bilaterally, with calcified noncompressible vessels on her ABIs.  Her left lower extremity is the more severely symptomatic leg.  We discussed options.  We discussed there was probably a neuropathic component as well to her pain, but certainly peripheral arterial disease was likely playing a significant contributing factor.  She desired intervention.  The risks and benefits were discussed. Informed consent was obtained.   DESCRIPTION OF PROCEDURE: The patient is brought to the vascular suite. Groins were shaved and prepped and a sterile surgical field was created.   Due to body habitus, ultrasound was necessary to access the right femoral artery.  The right femoral artery is visualized under direct ultrasound guidance without difficulty and found to be patent at this location.  With a Seldinger needle, I accessed in the common femoral artery under direct ultrasound guidance and a permanent image was recorded.  A 5 French sheath was placed.  Pigtail catheter was placed in the aorta at the L1 level and AP aortogram was performed.  This showed patent renal arteries bilaterally.  The aorta and iliac segments showed no significant flow-limiting stenosis.  I then crossed the aortic bifurcation and advanced to the left femoral head and selective left lower extremity angiogram was then performed.  This demonstrated calcific disease within the superficial femoral artery, profunda femoris artery and popliteal artery, but no significant stenosis in the common femoral artery, profunda, femoris artery, superficial femoral artery or popliteal artery. There was, however, moderate stenosis in the 60% to 70% range in the tibioperoneal trunk.  The posterior tibial artery had multiple areas of stenoses and occlusion, but did reconstitute in the foot.  The peroneal artery was small but patent.  The anterior tibial artery was occluded about 8 cm beyond its origin and did not reconstitute distally.  Given her significant tibial disease. I elected to treat the tibioperoneal trunk and posterior tibial arteries to improve her distal circulation, most for claudication symptoms were on the cath and lower leg.  A 6 French Ansell sheath was placed over a Terumo Advantage wire and the patient was given 3500 units of intravenous heparin.  A CXI catheter was used to get into the tibioperoneal trunk, and posterior tibial artery and then I  exchanged for a 0.018 wire and crossed the lesion and parked the wire into the foot.  A 2.5 mm diameter angioplasty balloon was inflated from the posterior tibial artery at  the ankle up to the tibioperoneal trunk, narrowing was seen in multiple locations which resolved at about 8 to 10 atmospheres.  Completion of the angiogram showed significant improvement in the posterior tibial artery with continuous flow that was now into the foot.  However, there was still some narrowing in the posterior tibial artery and I elected to up-size to a 3 mm diameter angioplasty balloon in this location and inflated a 3 mm diameter x 10 cm length angioplasty balloon in the posterior tibial artery and distal popliteal artery.  The completion of the angiogram following this showed about a 20% to 30% residual stenosis which was not flow limiting in the tibioperoneal trunk.  Continuous flow in the posterior tibial artery into the foot and remained patent peroneal artery, which was small and now the posterior tibial artery filled much more rapidly than the peroneal artery.  At this point of the procedure, the sheath was pulled back to the ipsilateral external iliac artery and oblique arteriogram was performed.  StarClose closure device was deployed in the usual fashion with excellent hemostatic result. The patient tolerated the procedure well and was taken to the recovery room in stable condition.      ____________________________ Algernon Huxley, MD jsd:DT D: 01/12/2014 12:10:06 ET T: 01/12/2014 12:43:10 ET JOB#: [id number]  cc: Algernon Huxley, MD, <Dictator> Deborra Medina, MD Algernon Huxley MD ELECTRONICALLY SIGNED 01/23/2014 15:06

## 2014-09-02 NOTE — H&P (Signed)
PATIENT NAME:  Pamela Collier, Pamela Collier MR#:  [id number] DATE OF BIRTH:  [birth year]  DATE OF ADMISSION:  11/14/2013  PRIMARY CARE PHYSICIAN:  Dr. Derrel Nip  HISTORY OF PRESENT ILLNESS:  Patient is a 74 year old African American female with history of hypertension, diabetes mellitus with neuropathy, also diastolic CHF, who presents to the hospital with complaints of significant shortness of breath.  According to patient, she was doing well up until Thursday when she started having increasing shortness of breath.  Apparently, she came back from [place] on [place]. She noted the lower extremity swelling. She took her Lasix in the evening. However, on Thursday, next day, she started having worsening shortness of breath.  Shortness of breath usually whenever she walks. She denies any significant shortness of breath whenever she lies down flat. Denies any cough or phlegm production.  Admits of having significant wheezing as well as lower extremity swelling. She was noted to have elevated blood pressure close to 200s in the Emergency Room and her chest x-ray revealed as well as CT scan of her chest revealed worsening right pleural effusion.  Hospitalist services were contacted for admission. The patient does not take Lasix twice a day, only if she has worsening lower extremity swelling. She does take it only once a day.   PAST MEDICAL HISTORY:  Significant for history of hypertension, diabetes mellitus with neuropathy, history of hyperlipidemia, chronic diastolic CHF, admission for diastolic CHF in May [id number]. At that time, she was discharged on Lasix twice a day and Benicar 20 mg p.o. daily dose. It appears that her Benicar has been discontinued.   ALLERGIES: SULFA DRUGS WHICH CAUSE HIVES.   SOCIAL HISTORY: No smoking, alcohol abuse or illicit drug abuse. Lives at home with her husband.   FAMILY HISTORY: The patient's mother lived to 42s.  Father died of complications with heart disease. Some  family history of coronary artery disease.   REVIEW OF SYSTEMS: CONSTITUTIONAL:  Positive for lightheadedness and dizziness and headaches, blurring of vision intermittently, also history of cataracts, and history of cataract surgery in the past bilaterally. Admits to having some nasal discharge as well as watery eyes, wheezing, shortness of breath. She uses CPAP at night for presumed obstructive sleep apnea. Admits of having intermittent chest pains in the left side of the chest for which she takes nitroglycerin.  Admits to having some lower extremity swelling which is worsening over the past few days. Also dyspnea on exertion. Intermittent abdominal pains in left lower quadrant, which she thinks is due to constipation. Her last bowel movement was 2 days ago. Denies any fevers, chills, fatigue, weakness, pains, weight loss or gain.  EYES: Denies any double vision, glaucoma.  EARS, NOSE, AND THROAT:  Denies any tinnitus, allergies, epistaxis, sinus tenderness, or difficulty swallowing. RESPIRATORY: Denies any cough, hemoptysis, or asthma.  CARDIOVASCULAR: Denies orthopnea, arrhythmias, palpitations or syncope. GASTROINTESTINAL: Denies any nausea, vomiting, diarrhea, rectal bleeding or change in bowel habits.  GENITOURINARY: Denies dysuria, hematuria, frequency, incontinence. ENDOCRINOLOGY: Denies any polydipsia, nocturia, thyroid problems, heat or cold intolerance or thirst.  HEMATOLOGIC: Denies anemia, easy bruising or bleeding, swollen glands.  SKIN: Denies acne, rash, lesions or change in moles.  MUSCULOSKELETAL: Denies arthritis, cramps, swelling. NEUROLOGIC: Admits of claudication.  Legs are hurting in the calves whenever she walks around. Also admits of having some pain in the left buttock area on the side. No numbness, epilepsy, tremor. PSYCHIATRIC: Denies anxiety, insomnia or depression.   PHYSICAL EXAMINATION:  VITAL SIGNS:  On arrival to the hospital, temperature was 98.6, pulse 62,  respirations 22, blood pressure 196/80, saturation 98% on room air; however, it dropped down to 86 on exertion on room air.  GENERAL: This is a well-developed, well-nourished African American female, obese, sitting on the stretcher.  HEENT:   Her pupils are equal, react to light.  Extraocular movements intact. no icterus or conjunctivitis.  She does have fine tremor. No pharyngeal erythema. Mucosa is moist.  NECK: No masses. Supple, nontender. Thyroid is not enlarged. No adenopathy. No JVD or carotid bruits bilaterally. Full range of motion.  LUNGS: Diminished breath sounds on the right, dullness to percussion on the right at the base. No significant rhonchi or rales or wheezing. No labored inspirations, increased effort, dullness to percussion. Not in overt respiratory distress. The patient does have oxygen now in her nares bilaterally through nasal cannula.  CARDIOVASCULAR: S1, S2 appreciated. Rythm is regular. PMI not lateralized. Chest is nontender to palpation. Pedal pulses 1+. No lower extremity edema, calf tenderness or cyanosis was noted.   ABDOMEN: Soft. Bowel sounds are present. No hepatosplenomegaly or masses were noted. The patient did have some discomfort in left lower quadrant on deep palpation, but no rebound or guarding was noted.  RECTAL: Deferred.  MUSCLE STRENGTH: Able to move all extremities. No cyanosis, degenerative joint disease.  Mild kyphosis. Gait was not tested.  SKIN: Did not reveal any rashes, lesions, erythema, nodularity or induration. It was warm and dry to palpation; however, somewhat cool in lower extremities in her feet. Pulses, dorsalis were diminished bilaterally in her feet.  LYMPHATIC: No adenopathy in the cervical region.  NEUROLOGIC: Cranial grossly intact. Sensory is intact. No dysarthria or aphasia. The patient is alert, oriented to time, person, and place. Cooperative. Memory is good.  PSYCHIATRIC: No significant confusion, agitation or depression  noted.  LABORATORY DATA: BMP showed glucose of 191, otherwise unremarkable. The patient's cardiac enzymes were normal. CBC within normal limits with white blood cell count 5.5, hemoglobin 12.6, platelet count 244,000. EKG revealed a normal sinus rhythm with left axis deviation 64 beats per minute.  First degree AV block with PR interval of 208 msec, incomplete right bundle branch block, left ventricular hypertrophy with repolarization abnormality, nonspecific ST-T changes were noted. No significant change since April 2014.   ASSESSMENT AND PLAN:  1. Acute respiratory failure. Admit patient to medical floor. Very likely patient's acute respiratory failure is due to worsening right pleural effusion or due to acute on chronic diastolic congestive heart failure.  Admit patient to medical floor. Continue Lasix. Change to IV, also nitroglycerin topically. 2. Malignant hypertension. We will initiate the patient on Norvasc.  3. Acute on chronic diastolic congestive heart failure. We will continue Lasix, Norvasc and will follow.  4. Diabetes mellitus. Continue home medications.  5. Peripheral vascular disease. Patient would benefit from Plavix as well as vascular surgery evaluation.  Some concern that patient's claudication is due to peripheral vascular disease.  6. Left lower quadrant abdominal pain, likely due to constipation. We will get KUB done and we will continue patient on Colace as well as Senna, following her clinically.   TIME SPENT: 50 minutes for this  patient.   ____________________________ Theodoro Grist, MD rv:dd D: 11/14/2013 16:34:33 ET T: 11/14/2013 18:14:55 ET JOB#: [id number]  cc: Theodoro Grist, MD, <Dictator> Deborra Medina, MD [place] MD ELECTRONICALLY SIGNED 12/05/2013 15:47

## 2014-09-02 NOTE — Discharge Summary (Signed)
PATIENT NAME:  Pamela Collier, Pamela Collier MR#:  [id number] DATE OF BIRTH:  17-Dec-1940  DATE OF ADMISSION:  11/14/2013 DATE OF DISCHARGE:  11/17/2013  ADMITTING DIAGNOSIS: Shortness of breath.   DISCHARGE DIAGNOSES: 1.  Acute respiratory failure, as evidence by acceesory muscle use possibly felt to be due to acute on chronic diastolic congestive heart failure as well as right-sided pleural effusion.  2.  Right-sided pleural effusion of unclear cause. There was no growth noted. Possible pneumonia associated based on post thoracentesis chest x-ray.  3.  History of hypertension.  4.  Diabetes.  5.  Diabetic neuropathy.  6.  Hyperlipidemia.   CONSULTANTS: None.   PERTINENT LABS AND EVALUATIONS: BMP: Glucose 191. Cardiac enzymes were negative. WBC count on admission 5.5, hemoglobin 12.6, platelet count 244,000.   EKG showed normal sinus rhythm with first-degree AV block. Nonspecific ST-T wave changes.   Pleural fluid showed nucleated cell 1471, lymphocytes 30. Culture results: No growth in 36 hours. No organisms identified. AFBs smear was negative. Fluid cytology showed negative for malignant cells. Mesothelial cells present.   HOSPITAL COURSE: Please refer to H and P done by the admitting physician. The patient is a 74 year old African American female with history of diastolic CHF in the past who presented with complaint of shortness of breath. The patient was noted in the ED to have right-sided pleural effusion. She initially was thought to have a diastolic CHF and placed on IV Lasix. After being placed on IV Lasix, she had limited urine output and did not diurese much and her creatinine increased. Therefore, the Lasix was discontinued. Due to her symptomology, she underwent a CT per PE protocol which just showed right-sided pleural effusion which had increased in size. Therefore, she underwent a thoracentesis of this fluid. Fluid is felt to be likely exudative based on her LDH. The culture of the body fluid  showed no growth. AFB was negative. Post thoracentesis chest x-ray did show possible infiltrate in the right lung suggesting of a possible underlying pneumonia. The patient was started on antibiotics. In terms of her respiratory status, she is doing much better after getting the thoracentesis, and she will need outpatient follow-up with her primary pulmonologist. At this time, she is doing much better and is stable for discharge.   DISCHARGE MEDICATIONS: NovoLog 70/30 with 37 units in the morning and 45 units at bedtime, albuterol 1 puff q. 6 p.r.n., Lyrica 100 one tab p.o. t.i.d., metoprolol tartrate 50 mg 1/2 tab b.i.d., isosorbide mononitrate 30 mg daily, olmesartan 20 mg daily, rosuvastatin 10 mg at bedtime, aspirin 81 one tab p.o. daily, amlodipine 10 daily, levofloxacin 750 one tab p.o. q. 48 x 6 days.   DISCHARGE DIET: Regular.   DISCHARGE ACTIVITY: As tolerated.   DISCHARGE FOLLOWUP: Follow up with Dr. Derrel Nip in 1 to 2 weeks. Follow up with her primary pulmonologist in 2 to 4 weeks.  TIME SPENT: 35 minutes.   ____________________________ Lafonda Mosses Posey Pronto, MD shp:sb D: 11/18/2013 08:23:08 ET T: 11/18/2013 10:03:18 ET JOB#: [id number]  cc: Child Campoy H. Posey Pronto, MD, <Dictator> Alric Seton MD ELECTRONICALLY SIGNED 11/27/2013 9:43

## 2014-09-03 NOTE — Consult Note (Signed)
General Aspect 74 yo female with history of cad and recent funcitonal study which was concerniing for possible ischemia. Pt now returns with chest pian at rest considtant with her angina. She has ruled out for an mi but had persistant rest chest pain.   Physical Exam:   GEN obese    HEENT PERRL    NECK supple    RESP normal resp effort  clear BS    CARD Regular rate and rhythm    ABD denies tenderness  no Adominal Mass    LYMPH negative neck    EXTR negative cyanosis/clubbing, negative edema    SKIN normal to palpation    NEURO cranial nerves intact, motor/sensory function intact    PSYCH A+O to time, place, person   Review of Systems:   Subjective/Chief Complaint chest pain    General: No Complaints    Skin: No Complaints    ENT: No Complaints    Eyes: No Complaints    Neck: No Complaints    Respiratory: No Complaints    Cardiovascular: Chest pain or discomfort    Gastrointestinal: No Complaints    Genitourinary: No Complaints    Vascular: No Complaints    Musculoskeletal: No Complaints    Neurologic: No Complaints    Hematologic: No Complaints    Endocrine: No Complaints    Psychiatric: No Complaints    Review of Systems: All other systems were reviewed and found to be negative    Medications/Allergies Reviewed Medications/Allergies reviewed     PVD - Peripheral Vascular Disease:    CHF:    Arthritis:    Kidney Stones:    CAD:    Shingles:    Hypercholesterolemia:    HTN:    Neuropathy:    Diabetes:    Angioplasty to right leg: Aug 2012   Colonoscopy: 2012   Cardiac Stent Placement: x 2   Cataract Extraction (bilateral one in 04/2010 and 05/2009):    partial hystorectomy:    Cholecystectomy:   Home Medications:  enalapril 20 mg oral tablet: 1 tab(s) orally once a day (in the morning), Active  Caltrate 600 Plus oral tablet: 1 tab(s) orally once a day, Active  metoprolol tartrate 25 mg oral tablet: 1 tab(s) orally 2  times a day, Active  Lyrica 75 mg oral capsule: 1 cap(s) orally 3 times a day, Active  Crestor 10 mg oral tablet: 1 tab(s) orally once a day (at bedtime), Active  Boniva 150 mg oral tablet: 1 tab(s) orally once a month, Active  Lantus 100 units/mL subcutaneous solution: 20 unit(s) subcutaneous once a day (in the morning), Active  amlodipine 10 mg oral tablet: 1 tab(s) orally once a day, Active  aspirin 81 mg oral tablet: 1 tab(s) orally once a day, Active  Lantus 100 units/mL subcutaneous solution: 35 units subcutaneous once a day (at bedtime) , Active  metformin 1000 mg tablet: 1 tab(s) orally 2 times a day x 30 days , Active  EKG:   EKG NSR    Sulfa drugs: Itching, Rash  Percocet 7.5/325: Do NOT Use    Impression 74 yo female followed by Dr. Clayborn Bigness with history of cad s/p pci who was admitted with chest pian similar to her angina. She had a borderline positive funcitonal study recently. Has ruled out for an mi and is currently pain free and stable. Given borderline funcitonal study with prior cad and rest pain, will need invasive cardiac evalaution to evalaute anatoy. Risk and benefits were discussed.  Plan 1. Continue with current meds. 2. Proceed with cardaic cath in am. Further recs after cath coplete.   Electronic Signatures: Teodoro Spray (MD)  (Signed 06-Jan-13 20:18)  Authored: General Aspect/Present Illness, History and Physical Exam, Review of System, Past Medical History, Home Medications, EKG , Allergies, Impression/Plan   Last Updated: 06-Jan-13 20:18 by Teodoro Spray (MD)

## 2014-09-03 NOTE — Discharge Summary (Signed)
PATIENT NAME:  Pamela Collier, Pamela Collier MR#:  [id number] DATE OF BIRTH:  1940/11/20  DATE OF ADMISSION:  05/17/2011 DATE OF DISCHARGE:  05/19/2011  For a detailed note, please take a look at the history and physical done by Dr. Brien Few Phichith.   DISCHARGE DIAGNOSES:  1. Chest pain likely secondary to chronic stable angina.  2. History of diffuse coronary artery disease. 3. Hypertension. 4. Diabetes. 5. Osteoporosis.   DIET: The patient was discharged on an [employer] low sodium, low fat diet.   ACTIVITY: As tolerated.   DISCHARGE FOLLOWUP: Followup with Dr. Lujean Amel in the next 1 to 2 weeks. Also followup with Dr. Casilda Carls in the next 1 to 2 weeks.   DISCHARGE MEDICATIONS: 1. Enalapril 20 mg daily.  2. Caltrate 600 mg daily.  3. Metoprolol tartrate 25 mg twice a day. 4. Lyrica 75 mg three times daily. 5. Crestor 10 mg daily.  6. Boniva 150 mg monthly.  7. Lantus 20 units daily.  8. Amlodipine 10 mg daily.  9. Aspirin 81 mg daily.  10. Lantus 35 units at bedtime.  11. Metformin 1000 mg twice a day, which she is to hold as she got a catheterization done during the hospital course. 12. Imdur 30 mg daily, new medication.   CONSULTANT: Lujean Amel, MD - Cardiology.   PERTINENT STUDIES: Cardiac catheterization done on 05/19/2011 showed diffuse severe coronary artery disease of the RCA and moderate disease on the left with an ejection fraction of 35 to 40% with global hypokinesis.   HOSPITAL COURSE: This is a 74 year old female with medical problems as mentioned above who presented to the hospital with recurrent chest pain.  1. Chest pain: Given the patient's history of diabetes, recurrent symptoms and her having unequivocal stress test done in November 2012, she was admitted to the hospital for further management. She was seen in consultation by Dr. Clayborn Bigness who ended up performing cardiac catheterization on her on 05/19/2011 which showed diffuse disease  on the right side, although none of it was amenable to any stenting or angioplasty. On the day of the catheterization, the patient actually has been chest pain free and hemodynamically stable. As per cardiology, we are to continue medical management and Imdur was added to her regimen on top of her maintenance medication including aspirin, beta blockers, statins, and ACE inhibitors.  2. Pneumonia: This was on the right lower lobe. This was an incidental finding and noted on chest x-ray on admission. The patient actually had no upper respiratory symptoms. She has been started on Levaquin. She will finish therapy with Levaquin for her pneumonia. She currently remains afebrile and hemodynamically stable. 3. Diabetes: The patient did not have any hypoglycemic episodes during the hospital course. She was maintained on her Lantus. She will resume that upon discharge.  4. Osteoporosis. She will resume her Boniva.  5. Hyperlipidemia: The patient was maintained on her Crestor and she will resume that.  6. Diabetic neuropathy: The patient was maintained on Lyrica and she will resume that too.               CODE STATUS: FULL CODE.   TIME SPENT: 35 minutes.  ____________________________ Belia Heman. Verdell Carmine, MD vjs:slb D: 05/19/2011 16:30:51 ET T: 05/20/2011 15:32:39 ET JOB#: [id number]  cc: Belia Heman. Verdell Carmine, MD, <Dictator> Dwayne D. Clayborn Bigness, MD Isla Pence, MD Henreitta Leber MD ELECTRONICALLY SIGNED 05/21/2011 16:07

## 2014-09-03 NOTE — H&P (Signed)
PATIENT NAME:  Pamela Collier, Pamela Collier MR#:  [id number] DATE OF BIRTH:  [birth year]  DATE OF ADMISSION:  05/17/2011  REFERRING PHYSICIAN: Dr. Beather Arbour PRIMARY CARE PHYSICIAN: Dr. Rosario Jacks   PRESENTING COMPLAINT: Chest pain, left arm pain, shortness of breath.   HISTORY OF PRESENT ILLNESS: Pamela Collier is a 74 year old woman with history of coronary artery disease, hypertension, diabetes, hyperlipidemia, combined systolic and diastolic dysfunction, congestive heart failure who represents with recurrent episode of chest pain. She was last admitted in November 2012 for chest pain rule out and discharged for outpatient work-up. She had an echocardiogram as well as a Myoview done in November. Her echo showed ejection fraction of 35% to 45% with moderate left ventricular hypertrophy, moderate TR, mid to moderate MR, left atrial moderate dilatation. She had a Myoview done that was considered poor study but could also be possibly global ischemia. Dr. Clayborn Bigness had recommended possible work-up with catheterization if her symptoms persist or worsen. She returns with reports of left-sided chest pain that is sharp in nature with radiation down her left arm with some left arm numbness. She reports some shortness of breath. Reports also similar episode back in March but her shortness of breath was much more severe at that time. She continues to have chest pain, discomfort. She endorses presyncope and some palpitations as well as headache. Denies any orthopnea or PND or lower extremity swelling. Denies any nausea or vomiting or diaphoresis. On initial arrival her systolic blood pressure was 190.   PAST MEDICAL HISTORY:  1. Hypertension.  2. Diabetes.  3. Hyperlipidemia.  4. Coronary artery disease status post Myoview as above in November 2012 that was considered a poor LexiScan because of poor uptake of tracer in the myocardium with significant GI uptake which limits interpretation. No evidence of clear reversible ischemia. No  significant uptake during stress but relatively normal resting images. It is not clear whether this represents global ischemia versus poor study. Will treat the patient medically for now unless symptoms persist or worsen and then will consider recommending cardiac catheterization. She had a catheterization in 2011 showing RCA lesion of 90%, mid to moderate LAD and circumflex disease.  5. Systolic and diastolic dysfunction congestive heart failure. Echocardiogram from November 2012 with ejection fraction of 35% to 45%, moderate left ventricular hypertrophy, mild to moderate MR, moderate TR, and left atrium moderate dilatation.  6. Arthritis.  7. Osteoporosis.   PAST SURGICAL HISTORY:  1. Cholecystectomy.  2. Lysis of adhesions.  3. Hysterectomy.  4. Bilateral cataract surgery.   ALLERGIES: Sulfa.   MEDICATIONS:  1. Aspirin 81 mg daily.  2. Boniva 150 mg weekly.  3. Crestor 10 mg daily.  4. Enalapril 20 mg daily.  5. Lantus 35 units in the evening and 22 units in the morning.  6. Multivitamin daily.  7. Caltrate 600+ daily.  8. Lyrica 75 mg t.i.d.  9. Metformin 1000 mg b.i.d.  10. Metoprolol 25 mg b.i.d.  11. Norvasc 5 mg daily.   SOCIAL HISTORY: She denies tobacco, alcohol or drug use. She lives in [place] with her husband and son.   FAMILY HISTORY: Coronary artery disease, hypertension, and cancer.   REVIEW OF SYSTEMS: CONSTITUTIONAL: Denies any fevers, chills. She endorses nausea but no vomiting. EYES: History of cataracts. ENT: No epistaxis or discharge or dysphagia. RESPIRATORY: No cough, wheezing, hemoptysis. She reports shortness of breath with the chest pain. CARDIOVASCULAR: As per history of present illness. GASTROINTESTINAL: Endorses nausea but no vomiting. No diarrhea, abdominal pain, hematemesis, or melena.  GENITOURINARY: No dysuria or hematuria. ENDO: No polyuria or polydipsia. HEMATOLOGIC: No easy bleeding. SKIN: No ulcers. MUSCULOSKELETAL: She has chronic low back pain  and leg pain. NEUROLOGIC: No one-sided weakness. She reports some tingling and numbness of her left side. PSYCH: Denies any depression or suicidal ideation.   PHYSICAL EXAMINATION:  VITAL SIGNS: Temperature 97.9, pulse 73, respiratory rate 18, blood pressure 190/73, sating 98% on room air.   GENERAL: Lying in bed in no apparent distress.   HEENT: Normocephalic, atraumatic. Pupils equal, symmetric. Nares without discharge. Moist mucous membrane.   NECK: Soft and supple. No adenopathy or JVP.   CARDIOVASCULAR: Non-tachy. No murmurs, rubs, or gallops.   LUNGS: Faint crackles. No use of accessory muscles or increased respiratory effort.   ABDOMEN: Soft, nontender, positive bowel sounds.   EXTREMITIES: No edema. Dorsal pedis pulses intact.   MUSCULOSKELETAL: No joint effusion.   SKIN: No ulcers.   NEUROLOGIC: Symmetrical strength. No focal deficits.   PSYCH: She is alert and oriented. Patient is cooperative.   LABORATORY, DIAGNOSTIC AND RADIOLOGICAL DATA: Glucose 209, BUN 12, creatinine 0.83, sodium 141, potassium 3.8, chloride 104, carbon dioxide 26, calcium 8.8, anion gap 11, WBC 7.3, hemoglobin 11.8, hematocrit 34.9, platelets 266, MCV 89, troponin 0.03, BNP 1278. Chest x-ray with evidence of some congestive heart failure.     ASSESSMENT AND PLAN: Pamela Collier is a 74 year old woman with history of coronary artery disease, hyperlipidemia, combined diastolic and systolic dysfunction, congestive heart failure, hypertension, diabetes presenting with chest pain, left arm pain, shortness of breath.  1. Chest pain, atypical. Admitted in November 2012 as dictated above with echocardiogram and catheterization and Myoview results also dictated above. Will admit patient and continue on tele and cycle cardiac enzymes. Will obtain cardiology consultation with Dr. Clayborn Bigness as patient may require catheterization based on his Myoview results. Will start on heparin drip, statin, ACE inhibitor, beta  blocker, and nitro patch. Her LDL was 72 and HDL was 36 on last admission as well as A1c of 9.4. Will send a TSH.  2. Combined diastolic dysfunction and systolic dysfunction congestive heart failure. Echo results as above. BNP is slightly higher since her last admission. She is not on any diuresis. Will initiate on Lasix as this may also be contributing to her symptoms. Will continue ins and outs, daily weights. Restart her beta blocker, ACE inhibitor.  3. Hypertension, uncontrolled. Resume Norvasc, metoprolol, enalapril. Further adjustment if needed.  4. Diabetes, uncontrolled. Hold metformin. Start sliding scale insulin and Lantus given A1c not at goal. May benefit from medication adjustments prior to discharge.  5. Prophylaxis with heparin drip, aspirin and Protonix.   TIME SPENT: Approximately 45 minutes spent on patient care.    ____________________________ Rita Ohara, MD ap:cms D: 05/17/2011 06:40:42 ET T: 05/17/2011 09:21:15 ET JOB#: [id number]  cc: Brien Few Ksenia Kunz, MD, <Dictator> Isla Pence, MD Rita Ohara MD ELECTRONICALLY SIGNED 05/31/2011 2:22

## 2014-09-05 ENCOUNTER — Telehealth: Payer: Self-pay | Admitting: *Deleted

## 2014-09-05 NOTE — Telephone Encounter (Signed)
Pt and her daughter in law called states the Metoprolol Rx that was Rx'd by Dr Derrel Nip on 4.18.16 was 50mg  however the Rx that she has on hand from Dr Clayborn Bigness refilled 3.2.16 was 25mg .  Requesting clarification on which dose she should be taking.  Spoke with [place], and she agreed to advise them to call Dr Etta Quill office being that he is her Cardiologist.  Spoke with pt again, advised her of same.  She verbalized understanding.

## 2014-09-06 DIAGNOSIS — E119 Type 2 diabetes mellitus without complications: Secondary | ICD-10-CM | POA: Diagnosis not present

## 2014-09-06 DIAGNOSIS — Z0000 Encounter for general adult medical examination without abnormal findings: Secondary | ICD-10-CM | POA: Diagnosis not present

## 2014-09-06 DIAGNOSIS — D649 Anemia, unspecified: Secondary | ICD-10-CM | POA: Diagnosis not present

## 2014-09-06 DIAGNOSIS — E780 Pure hypercholesterolemia: Secondary | ICD-10-CM | POA: Diagnosis not present

## 2014-09-07 DIAGNOSIS — E559 Vitamin D deficiency, unspecified: Secondary | ICD-10-CM | POA: Diagnosis not present

## 2014-09-11 DIAGNOSIS — I10 Essential (primary) hypertension: Secondary | ICD-10-CM | POA: Diagnosis not present

## 2014-09-11 DIAGNOSIS — N390 Urinary tract infection, site not specified: Secondary | ICD-10-CM | POA: Diagnosis not present

## 2014-09-23 DIAGNOSIS — E780 Pure hypercholesterolemia: Secondary | ICD-10-CM | POA: Diagnosis not present

## 2014-09-23 DIAGNOSIS — J449 Chronic obstructive pulmonary disease, unspecified: Secondary | ICD-10-CM | POA: Diagnosis not present

## 2014-09-23 DIAGNOSIS — I10 Essential (primary) hypertension: Secondary | ICD-10-CM | POA: Diagnosis not present

## 2014-09-23 DIAGNOSIS — I2510 Atherosclerotic heart disease of native coronary artery without angina pectoris: Secondary | ICD-10-CM | POA: Diagnosis not present

## 2014-10-03 ENCOUNTER — Ambulatory Visit (INDEPENDENT_AMBULATORY_CARE_PROVIDER_SITE_OTHER): Payer: Medicare Other | Admitting: Internal Medicine

## 2014-10-03 ENCOUNTER — Telehealth: Payer: Self-pay | Admitting: Internal Medicine

## 2014-10-03 ENCOUNTER — Encounter: Payer: Self-pay | Admitting: Internal Medicine

## 2014-10-03 VITALS — BP 128/64 | HR 62 | Temp 98.4°F | Resp 14 | Ht 63.0 in | Wt 190.0 lb

## 2014-10-03 DIAGNOSIS — I255 Ischemic cardiomyopathy: Secondary | ICD-10-CM | POA: Diagnosis not present

## 2014-10-03 DIAGNOSIS — E785 Hyperlipidemia, unspecified: Secondary | ICD-10-CM | POA: Diagnosis not present

## 2014-10-03 DIAGNOSIS — N390 Urinary tract infection, site not specified: Secondary | ICD-10-CM

## 2014-10-03 DIAGNOSIS — R197 Diarrhea, unspecified: Secondary | ICD-10-CM

## 2014-10-03 DIAGNOSIS — D860 Sarcoidosis of lung: Secondary | ICD-10-CM

## 2014-10-03 DIAGNOSIS — I10 Essential (primary) hypertension: Secondary | ICD-10-CM

## 2014-10-03 DIAGNOSIS — T83511D Infection and inflammatory reaction due to indwelling urethral catheter, subsequent encounter: Secondary | ICD-10-CM

## 2014-10-03 DIAGNOSIS — IMO0002 Reserved for concepts with insufficient information to code with codable children: Secondary | ICD-10-CM

## 2014-10-03 DIAGNOSIS — E1169 Type 2 diabetes mellitus with other specified complication: Secondary | ICD-10-CM

## 2014-10-03 DIAGNOSIS — E1165 Type 2 diabetes mellitus with hyperglycemia: Secondary | ICD-10-CM | POA: Diagnosis not present

## 2014-10-03 DIAGNOSIS — K529 Noninfective gastroenteritis and colitis, unspecified: Secondary | ICD-10-CM

## 2014-10-03 DIAGNOSIS — K521 Toxic gastroenteritis and colitis: Secondary | ICD-10-CM

## 2014-10-03 DIAGNOSIS — E1129 Type 2 diabetes mellitus with other diabetic kidney complication: Secondary | ICD-10-CM

## 2014-10-03 DIAGNOSIS — T8351XD Infection and inflammatory reaction due to indwelling urinary catheter, subsequent encounter: Secondary | ICD-10-CM | POA: Diagnosis not present

## 2014-10-03 DIAGNOSIS — K760 Fatty (change of) liver, not elsewhere classified: Secondary | ICD-10-CM

## 2014-10-03 DIAGNOSIS — I2510 Atherosclerotic heart disease of native coronary artery without angina pectoris: Secondary | ICD-10-CM

## 2014-10-03 DIAGNOSIS — E1321 Other specified diabetes mellitus with diabetic nephropathy: Secondary | ICD-10-CM

## 2014-10-03 DIAGNOSIS — T3695XA Adverse effect of unspecified systemic antibiotic, initial encounter: Secondary | ICD-10-CM

## 2014-10-03 HISTORY — DX: Diarrhea, unspecified: R19.7

## 2014-10-03 HISTORY — DX: Type 2 diabetes mellitus with other specified complication: E11.69

## 2014-10-03 LAB — URINALYSIS, ROUTINE W REFLEX MICROSCOPIC
Bilirubin Urine: NEGATIVE
KETONES UR: NEGATIVE
NITRITE: NEGATIVE
Specific Gravity, Urine: >=1.03 — AB (ref 1.000–1.030)
Total Protein, Urine: 100 — AB
UROBILINOGEN UA: 0.2 (ref 0.0–1.0)
Urine Glucose: NEGATIVE
pH: 6 (ref 5.0–8.0)

## 2014-10-03 LAB — MICROALBUMIN / CREATININE URINE RATIO
CREATININE, U: 79.5 mg/dL
Microalb Creat Ratio: 76.8 mg/g — ABNORMAL HIGH (ref 0.0–30.0)
Microalb, Ur: 61.1 mg/dL — ABNORMAL HIGH (ref 0.0–1.9)

## 2014-10-03 LAB — COMPREHENSIVE METABOLIC PANEL
ALT: 13 U/L (ref 0–35)
AST: 16 U/L (ref 0–37)
Albumin: 3.8 g/dL (ref 3.5–5.2)
Alkaline Phosphatase: 68 U/L (ref 39–117)
BUN: 24 mg/dL — ABNORMAL HIGH (ref 6–23)
CHLORIDE: 105 meq/L (ref 96–112)
CO2: 27 meq/L (ref 19–32)
Calcium: 9.4 mg/dL (ref 8.4–10.5)
Creatinine, Ser: 1.1 mg/dL (ref 0.40–1.20)
GFR: 62.37 mL/min (ref >60.00)
Glucose, Bld: 174 mg/dL — ABNORMAL HIGH (ref 70–99)
Potassium: 4.2 mEq/L (ref 3.5–5.1)
Sodium: 137 mEq/L (ref 135–145)
TOTAL PROTEIN: 7.1 g/dL (ref 6.0–8.3)
Total Bilirubin: 0.8 mg/dL (ref 0.2–1.2)

## 2014-10-03 LAB — LDL CHOLESTEROL, DIRECT: LDL DIRECT: 73 mg/dL

## 2014-10-03 LAB — HEMOGLOBIN A1C: Hgb A1c MFr Bld: 8.1 % — ABNORMAL HIGH (ref 4.6–6.5)

## 2014-10-03 MED ORDER — FUROSEMIDE 20 MG PO TABS: 20.0000 mg | ORAL_TABLET | Freq: Every day | ORAL | Status: DC

## 2014-10-03 MED ORDER — ISOSORBIDE MONONITRATE ER 30 MG PO TB24: 30.0000 mg | ORAL_TABLET | Freq: Every day | ORAL | Status: DC

## 2014-10-03 MED ORDER — SULFAMETHOXAZOLE-TRIMETHOPRIM 800-160 MG PO TABS: 1.0000 | ORAL_TABLET | Freq: Two times a day (BID) | ORAL | Status: DC

## 2014-10-03 MED ORDER — METOPROLOL TARTRATE 25 MG PO TABS: 12.5000 mg | ORAL_TABLET | Freq: Two times a day (BID) | ORAL | Status: DC

## 2014-10-03 NOTE — Progress Notes (Signed)
Pre-visit discussion using our clinic review tool. No additional management support is needed unless otherwise documented below in the visit note.  

## 2014-10-03 NOTE — Telephone Encounter (Signed)
The urinary tract infection was NOT treated, because the bacteria was resistant to Cipro. She she may still have an infection.  I will send an antibiotic to her pharmacy   The Kayexelate was the powder she received, and it was to lower her potassium level. Is she still taking it?

## 2014-10-03 NOTE — Patient Instructions (Addendum)
We will do the referral for [place] for diabetes and for [place]  If your diarrhea continues ,  Please send some of the liquid stool back i the tubes I provided you  Resume Paxil and furosemide daily in the morning  You need to see Dr Clayborn Bigness and Dr Lake Bells soon

## 2014-10-03 NOTE — Telephone Encounter (Signed)
Patient daughter left note with information for treatment of UTI Cipro 500 mg and Kayexlate, please advise place note left in red folder. Patient daughter Lowella Bandy [phone]

## 2014-10-03 NOTE — Telephone Encounter (Signed)
Patient was only given two doses of kayexalate patient has completed course. Patient also was advised as to ABX and to take a probiotic for 3 weeks .

## 2014-10-03 NOTE — Progress Notes (Signed)
Subjective:  Patient ID: Pamela Collier, female    DOB: 09-18-1940  Age: 74 y.o. MRN: [id number]  CC: The primary encounter diagnosis was Cardiomyopathy, ischemic. 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  HPI Pamela Collier presents for follow up on multiple issues and [place].  She was hospitalized  in [place]  From  March 29 to April 7, then readmitted for one day on April 7 to same institution.  She had follow up with Dr. Luther Bradley the cardiologist who treated her during her hospitalization .  His address is  [address] [place], [place]  [phone] .  Patient was admitted with respiratory distress due to pulmonary edema and underwent cardiac catheterization and PTCA/stent,  Not sure what artery was stented.  No dc summary available.  Dr. Alba Destine apparently placed her previous two stents many years ago . She is accompanied by her daughter who has many questions about her medications.  Patient was started on Brilinta 90 mg bid .  Patient is confused and now distrustful of her local cardiologist because she understood that her stress test last Fall was normal.  She has not followed up with local cardiology.  Dr. Clayborn Bigness had done a stress test and ECHO in october,  EF 45%   No reversible ischemia on stress,  Medical management with Imdur advised for angina .  The limitations of noninvasive tests were discussed with patients today  Has stopped the Paxil somteime during the visit to [facility] and has been more depressed.   Has not been taking Lyrica regularly Has not been taking xanax . Needs it to resume it for early morning waking ; she cannot return to  sleep with CPAP mask when wakes up Blood sugars have been 109 to 160 ,  Sometimes higher if she eats t,  Appetite has not been good. [name]   She was treated for a UTI with cipro but repeat Urine culture on May  2 showed E Coli resstant to Cipro, so septra was prescribed.  Se has not taken the second roun of antibiotics.  She denies dysuria but has been having brown discharge with urination.   Has been having recurrent onset nausea and loose stools which started  today.  Happened the first time while in [place] two weeks ago ,   Outpatient Prescriptions Prior to Visit  Medication Sig Dispense Refill  . albuterol (PROVENTIL) (2.5 MG/3ML) 0.083% nebulizer solution Take 3 mLs (2.5 mg total) by nebulization every 6 (six) hours as needed for wheezing or shortness of breath. 150 mL 1  . alendronate (FOSAMAX) 70 MG tablet Take with a full glass of water on an empty stomach every Sunday  Take with a full glass of water and take no other meds or food for   30 minutes . 4 tablet 11  . aspirin 81 MG EC tablet Take 1 tablet (81 mg total) by mouth daily. Swallow whole. 30 tablet 12  . B-D UF III MINI PEN NEEDLES 31G X 5 MM MISC USE AS DIRECTED WITH NOVOLOG FLEXPEN 100 each 6  . Blood Glucose Monitoring Suppl (TRUERESULT BLOOD GLUCOSE) W/DEVICE KIT 1 kit by Does not apply route 3 (three) times daily. 1 each 0  . fluticasone (FLONASE) 50  MCG/ACT nasal spray Place 2 sprays into the nose daily. 16 g 6  . glucose blood (TRUETEST TEST) test strip CHECK BLOOD GLUCOSE THREE TIMES DAILY Dx E11.29 100 each 0  . Insulin Aspart Prot & Aspart (NOVOLOG 70/30 MIX) (70-30) 100 UNIT/ML Pen 44 units before breakfast,  50 units before dinner.  NEEDS 12 PENS PER MONTH 30 mL 11  . Lactulose 20 GM/30ML SOLN 30 ml every 4 hours until constipation is relieved 236 mL 3  . losartan (COZAAR) 100 MG tablet TAKE ONE TABLET BY MOUTH ONCE DAILY 30 tablet 5  . meloxicam (MOBIC) 15 MG tablet Take 1 tablet (15 mg total) by mouth daily. 30 tablet 1  .  pregabalin (LYRICA) 100 MG capsule Take 1 capsule (100 mg total) by mouth 3 (three) times daily. (Patient taking differently: Take 100 mg by mouth 3 (three) times daily as needed. ) 270 capsule 3  . rosuvastatin (CRESTOR) 20 MG tablet Take 1 tablet (20 mg total) by mouth daily. 30 tablet 11  . traMADol (ULTRAM) 50 MG tablet Take 1 tablet (50 mg total) by mouth every 6 (six) hours as needed for moderate pain. 120 tablet 3  . TRUEPLUS LANCETS 30G MISC USE TO CHECK GLUCOSE THREE TIMES DAILY 400 each 0  . furosemide (LASIX) 20 MG tablet Take 20 mg by mouth daily.     . isosorbide mononitrate (IMDUR) 30 MG 24 hr tablet Take 1 tablet (30 mg total) by mouth daily. 30 tablet 5  . metoprolol (LOPRESSOR) 50 MG tablet Take 0.5 tablets (25 mg total) by mouth 2 (two) times daily. 30 tablet 5  . ALPRAZolam (XANAX) 0.5 MG tablet Take 1 tablet (0.5 mg total) by mouth daily as needed for sleep or anxiety. (Patient not taking: Reported on 10/03/2014) 30 tablet 3  . chlorpheniramine-HYDROcodone (TUSSIONEX) 10-8 MG/5ML LQCR Take 5 mLs by mouth at bedtime as needed for cough. (Patient not taking: Reported on 07/20/2014) 180 mL 0  . COMBIVENT RESPIMAT 20-100 MCG/ACT AERS respimat INHALE ONE PUFF EVERY 6 HOURS (Patient not taking: Reported on 10/03/2014) 4 g 1  . INS SYRINGE/NEEDLE 1CC/28G 28G X 1/2" 1 ML MISC 1 each by Does not apply route as needed. To test glucose levels daily.    [name] PARoxetine (PAXIL) 10 MG tablet Take 1 tablet (10 mg total) by mouth daily. (Patient not taking: Reported on 10/03/2014) 30 tablet 2   Facility-Administered Medications Prior to Visit  Medication Dose Route Frequency Provider Last Rate Last Dose  . albuterol (PROVENTIL) (5 MG/ML) 0.5% nebulizer solution 2.5 mg  2.5 mg Nebulization Once [name], MD        Review of Systems;  Patient denies headache, fevers, malaise, unintentional weight loss, skin rash, eye pain, sinus congestion and sinus pain, sore throat, dysphagia,  hemoptysis ,  cough, dyspnea, wheezing, chest pain, palpitations, orthopnea, edema, abdominal pain, nausea, melena, diarrhea, constipation, flank pain, dysuria, hematuria, urinary  Frequency, nocturia, numbness, tingling, seizures,  Focal weakness, Loss of consciousness,  Tremor, insomnia, depression, anxiety, and suicidal ideation.      Objective:  BP 128/64 mmHg  Pulse 62  Temp(Src) 98.4 F (36.9 C) (Oral)  Resp 14  Ht _0  (1.6 m)  Wt 190 lb (86.183 kg)  BMI 33.67 kg/m2  SpO2 96%  BP Readings from Last 3 Encounters:  10/03/14 128/64  07/20/14 134/60  06/29/14 150/78    Wt Readings from Last 3 Encounters:  10/03/14 190 lb (86.183 kg)  07/20/14 194 lb 8  oz (88.225 kg)  06/29/14 191 lb (86.637 kg)    General appearance: alert, cooperative and appears stated age Ears: normal TM's and external ear canals both ears Throat: lips, mucosa, and tongue normal; teeth and gums normal Neck: no adenopathy, no carotid bruit, supple, symmetrical, trachea midline and thyroid not enlarged, symmetric, no tenderness/mass/nodules Back: symmetric, no curvature. ROM normal. No CVA tenderness. Lungs: clear to auscultation bilaterally Heart: regular rate and rhythm, S1, S2 normal, no murmur, click, rub or gallop Abdomen: soft, non-tender; bowel sounds normal; no masses,  no organomegaly Pulses: 2+ and symmetric Skin: Skin color, texture, turgor normal. No rashes or lesions Lymph nodes: Cervical, supraclavicular, and axillary nodes normal.  No results found.  Assessment & Plan:   Problem List Items Addressed This Visit    DM type 2, uncontrolled, with renal complications    Remains uncontrolled due to poor appetite and limited understanding of diet despite prior referral to diabetic teaching  She has requested referral to diabetic education.  No changes to regimen until blood sugar log can be reviewed.   Lab Results  Component Value Date   HGBA1C 8.1* 10/03/2014         Relevant Orders   Hemoglobin  A1c (Completed)   LDL cholesterol, direct (Completed)   Microalbumin / creatinine urine ratio (Completed)   AMB Referral to [facility]   Ambulatory referral to diabetic education   Double vessel coronary artery disease    She has a history of prior PTCA/stent x 2, recent stress test negative for reversible ischemia ,  But underwent PTCA/stent in late April by Dr Alba Destine in [facility] when she present wit respiratory distress.  Records not available.  Brillenta prescribed.  patient will follow up with Dr. Clayborn Bigness.   Continue ASA, lipitor, beta blocker, Imdur and ARB      Relevant Medications   furosemide (LASIX) 20 MG tablet   metoprolol tartrate (LOPRESSOR) 25 MG tablet   isosorbide mononitrate (IMDUR) 30 MG 24 hr tablet   Pulmonary sarcoidosis    Patient advised to follow up with Dr Lake Bells      Antibiotic-associated diarrhea    Testing for C dificile colitis in process.  Contact precautions advised.       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 Currington metoprolol. I have also changed her furosemide and metoprolol tartrate. Additionally, I  am having her maintain her aspirin, fluticasone, ALPRAZolam, INS SYRINGE/NEEDLE 1CC/28G, albuterol, TRUERESULT BLOOD GLUCOSE, COMBIVENT RESPIMAT, PARoxetine, insulin aspart protamine - aspart, chlorpheniramine-HYDROcodone, rosuvastatin, Lactulose, pregabalin, losartan, B-D UF III MINI PEN NEEDLES, traMADol, alendronate,  TRUEPLUS LANCETS 30G, glucose blood, meloxicam, BRILINTA, and isosorbide mononitrate.  Meds ordered this encounter  Medications  . BRILINTA 90 MG TABS tablet    Sig: Take 1 tablet by mouth 2 (two) times daily.  [name] DISCONTD: metoprolol tartrate (LOPRESSOR) 25 MG tablet    Sig: Take 12.5 mg by mouth 2 (two) times daily.  . furosemide (LASIX) 20 MG tablet    Sig: Take 1 tablet (20 mg total) by mouth daily.    Dispense:  90 tablet    Refill:  1  . metoprolol tartrate (LOPRESSOR) 25 MG tablet    Sig: Take 0.5 tablets (12.5 mg total) by mouth 2 (two) times daily.    Dispense:  180 tablet    Refill:  1  . isosorbide mononitrate (IMDUR) 30 MG 24 hr tablet    Sig: Take 1 tablet (30 mg total) by mouth daily.    Dispense:  90 tablet    Refill:  1    Medications Discontinued During This Encounter  Medication Reason  . metoprolol (LOPRESSOR) 50 MG tablet   . furosemide (LASIX) 20 MG tablet Reorder  . metoprolol tartrate (LOPRESSOR) 25 MG tablet Reorder  . isosorbide mononitrate (IMDUR) 30 MG 24 hr tablet Reorder    Follow-up: Return in about 1 month (around 11/03/2014) for follow up diabetes.   [name], MD

## 2014-10-04 ENCOUNTER — Telehealth: Payer: Self-pay | Admitting: Internal Medicine

## 2014-10-04 ENCOUNTER — Other Ambulatory Visit: Payer: Self-pay | Admitting: Internal Medicine

## 2014-10-04 DIAGNOSIS — K529 Noninfective gastroenteritis and colitis, unspecified: Secondary | ICD-10-CM | POA: Diagnosis not present

## 2014-10-04 DIAGNOSIS — N3001 Acute cystitis with hematuria: Secondary | ICD-10-CM

## 2014-10-04 DIAGNOSIS — N390 Urinary tract infection, site not specified: Secondary | ICD-10-CM | POA: Insufficient documentation

## 2014-10-04 MED ORDER — ALPRAZOLAM 0.5 MG PO TABS: 0.5000 mg | ORAL_TABLET | Freq: Every day | ORAL | Status: DC | PRN

## 2014-10-04 MED ORDER — NITROFURANTOIN MONOHYD MACRO 100 MG PO CAPS: 100.0000 mg | ORAL_CAPSULE | Freq: Two times a day (BID) | ORAL | Status: DC

## 2014-10-04 MED ORDER — PAROXETINE HCL 10 MG PO TABS
10.0000 mg | ORAL_TABLET | Freq: Every day | ORAL | Status: DC
Start: 2014-10-04 — End: 2014-12-06

## 2014-10-04 MED ORDER — PREGABALIN 100 MG PO CAPS: 100.0000 mg | ORAL_CAPSULE | Freq: Three times a day (TID) | ORAL | Status: DC

## 2014-10-04 NOTE — Assessment & Plan Note (Addendum)
Remains uncontrolled due to poor appetite and limited understanding of diet despite prior referral to diabetic teaching  She has requested referral to diabetic education.  No changes to regimen until blood sugar log can be reviewed.   Lab Results  Component Value Date   HGBA1C 8.1* 10/03/2014

## 2014-10-04 NOTE — Assessment & Plan Note (Signed)
Patient advised to follow up with Dr Lake Bells

## 2014-10-04 NOTE — Assessment & Plan Note (Addendum)
She has a history of prior PTCA/stent x 2, recent stress test negative for reversible ischemia ,  But underwent PTCA/stent in late April by Dr Alba Destine in [facility] when she present wit respiratory distress.  Records not available.  Brillenta prescribed.  patient will follow up with Dr. Clayborn Bigness.   Continue ASA, lipitor, beta blocker, Imdur and ARB

## 2014-10-04 NOTE — Telephone Encounter (Signed)
Script given for Septra DS for uti patient called and stated she is allergic to sulfur. Checked allergy listed please advise patient did not pick up medication.

## 2014-10-04 NOTE — Telephone Encounter (Signed)
Mew rx for macrobid sent to [facility].  Please tell her to take it only for 5 days,  Not 7 as directed on bottle

## 2014-10-04 NOTE — Assessment & Plan Note (Signed)
Testing for C dificile colitis in process.  Contact precautions advised.

## 2014-10-04 NOTE — Assessment & Plan Note (Signed)
E Coli ,  By outside culture may 2 ,  Resistant to everything but septra and macrobid.  Allergies to septra noted.

## 2014-10-05 ENCOUNTER — Encounter: Payer: Self-pay | Admitting: Internal Medicine

## 2014-10-05 LAB — C. DIFFICILE GDH AND TOXIN A/B
C. DIFF TOXIN A/B: NOT DETECTED
C. DIFFICILE GDH: NOT DETECTED

## 2014-10-06 LAB — URINE CULTURE

## 2014-10-07 IMAGING — CR DG CHEST 2V
1 series · 2 of 2 positions shown · non-contrast
Comparison: none

REASON FOR EXAM: SOB
COMMENTS:   LMP: Post Hysterectomy

[Series 1: w chest pa · 0.14mm/px · 2 of 2 slices shown]
[im 1/2]
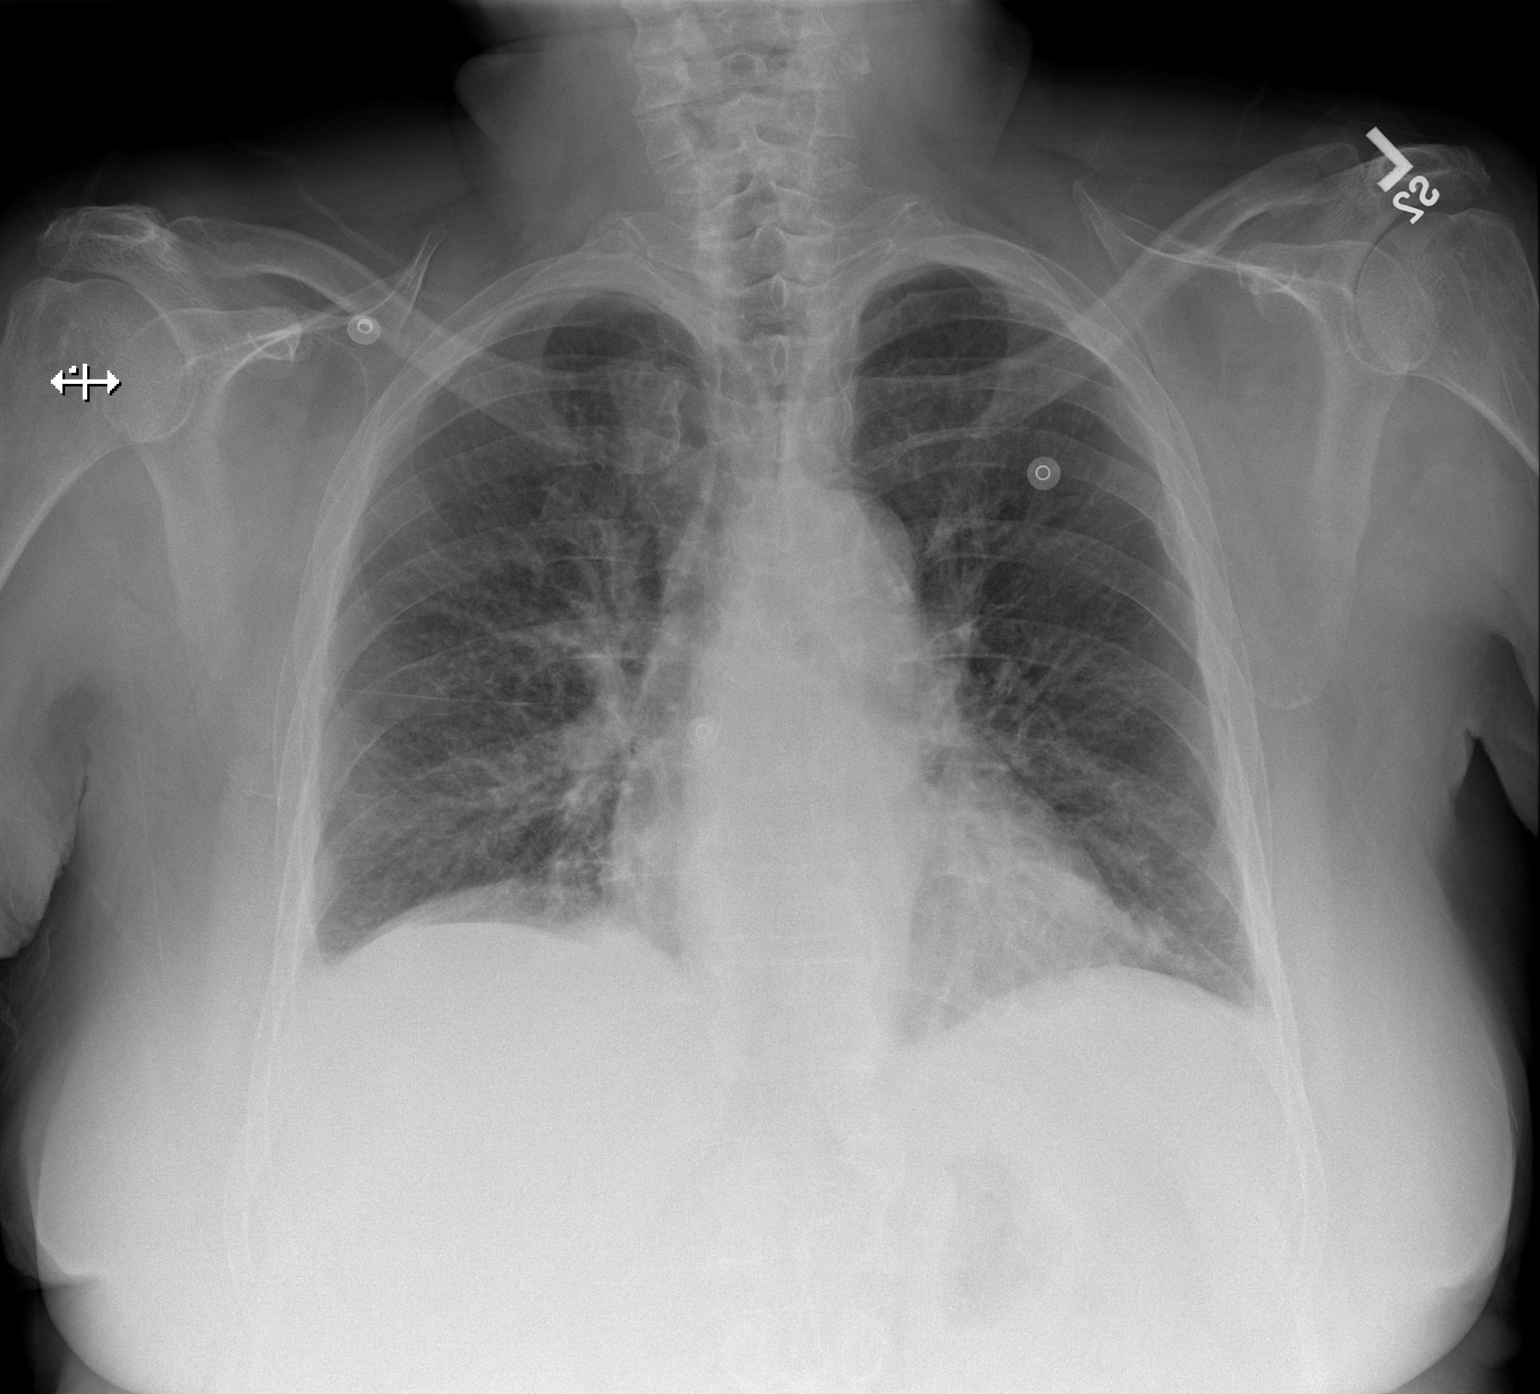
[im 2/2]
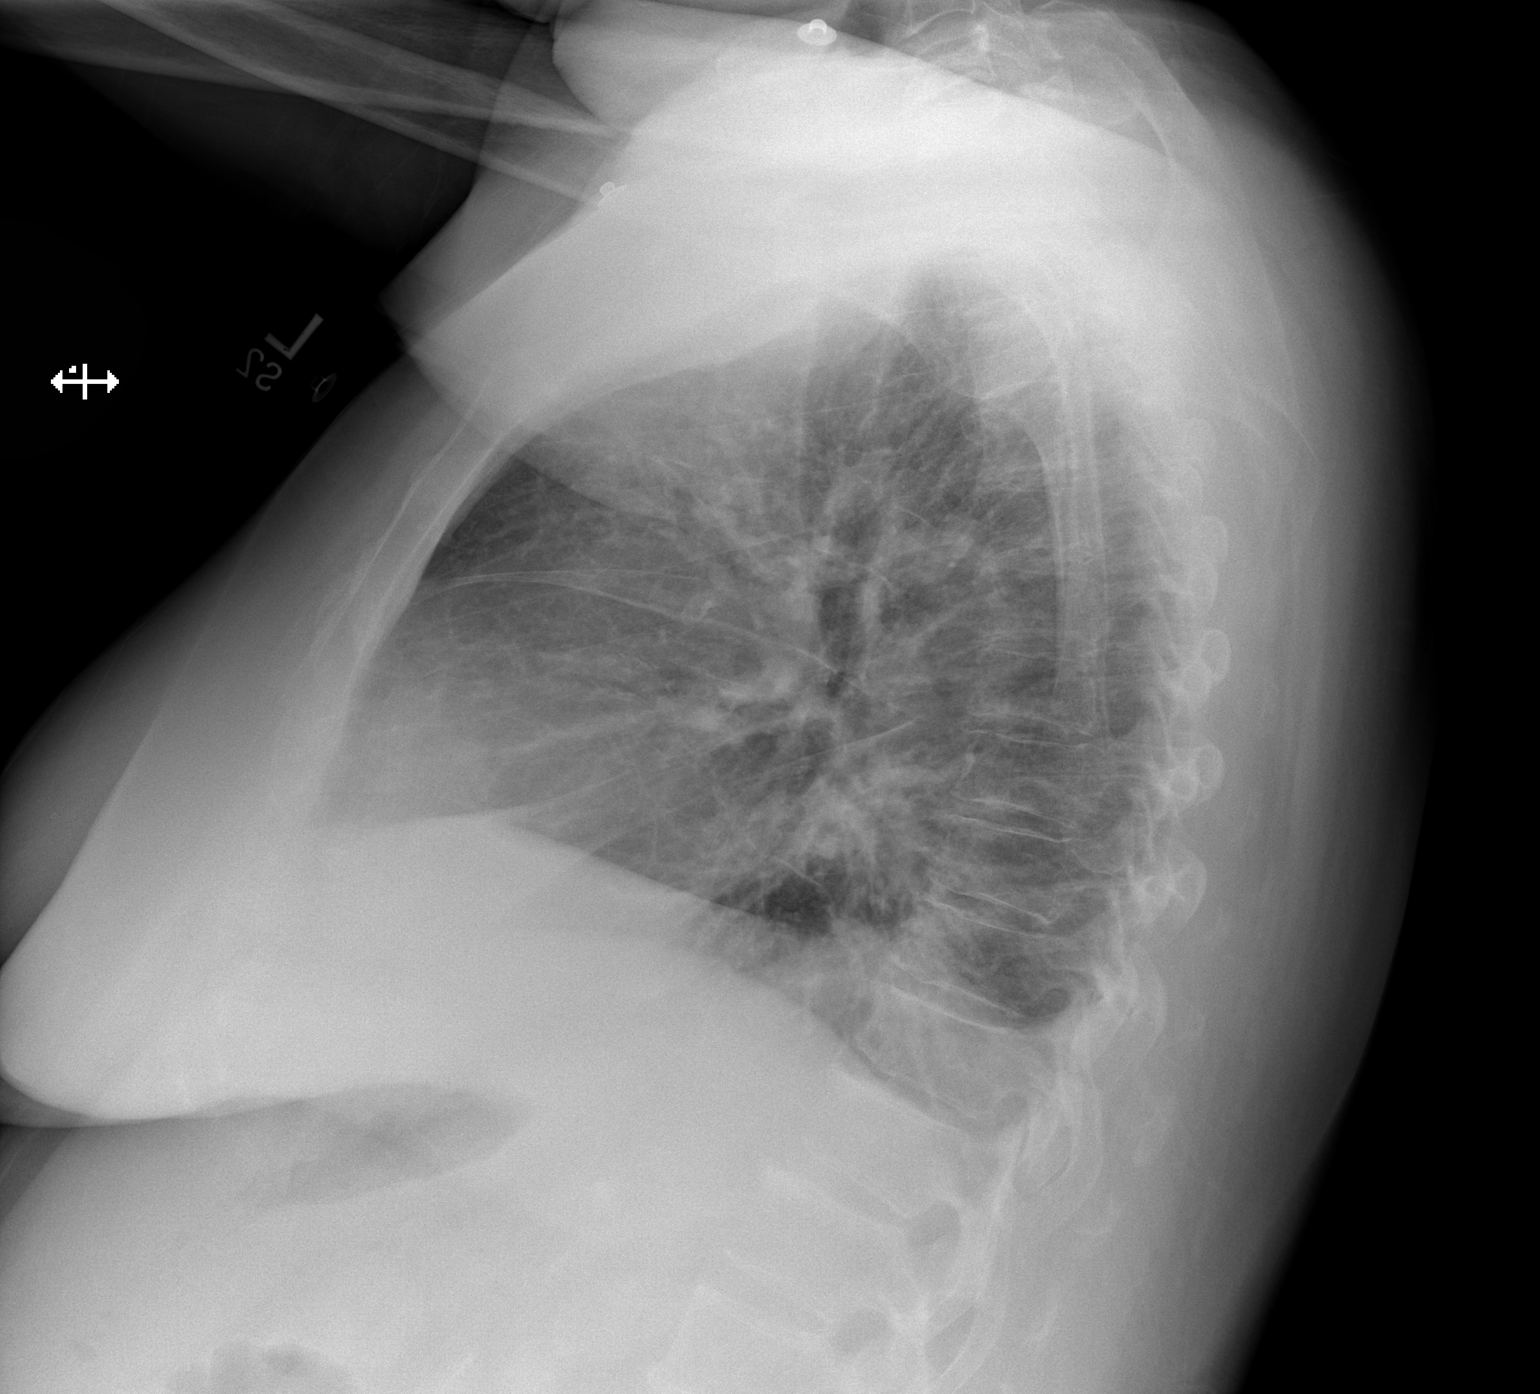

[2 of 2 positions shown; findings below may reference images not displayed]

PROCEDURE:     DXR - DXR CHEST PA (OR AP) AND LATERAL  - June 14, 2012 [DATE]

RESULT:     Comparison is made to the study May 06, 2012.

The lungs are better inflated today but remain borderline hypoinflated. The
interstitial markings are more conspicuous diffusely. There is a small
amount of fluid blunting the posterior costophrenic angles. The cardiac
silhouette is top normal in size. There is mild prominence of the central
pulmonary vascularity.
IMPRESSION: 1. Increased interstitial markings with small bilateral pleural effusions
and prominence of the pulmonary vascularity suggest low-grade CHF.
2. There is no focal pneumonia, but there may be subsegmental atelectasis in
the appropriate clinical setting.

[REDACTED]

## 2014-10-08 DIAGNOSIS — R197 Diarrhea, unspecified: Secondary | ICD-10-CM | POA: Diagnosis not present

## 2014-10-08 LAB — STOOL CULTURE

## 2014-10-10 ENCOUNTER — Other Ambulatory Visit: Payer: Self-pay

## 2014-10-10 DIAGNOSIS — E118 Type 2 diabetes mellitus with unspecified complications: Secondary | ICD-10-CM

## 2014-10-10 NOTE — Patient Outreach (Signed)
MD referral:  Placed call to patient. Patient reports that she knew someone was going to call her because she discussed it with her doctor.  Explained [facility] program.  Patient is interested in home visits . Patient reports that she is struggling with DM diet management. States that she also needs help with daily medication management. Reports that if she does not have the money to pay for her meds that her family helps her.   States that she would like a nurse to help her with her meds and DM diet.  Reports that she has difficulty understanding what to eat.   Patient reports that she lives with her husband but often has some financial concerns.  Reports that she needs help with "chores around the house" Patient has also accepted an assessment from social worker to assess needs.    PLAN:  Refer to community case Freight forwarder and Education officer, museum. Will mail letter and consent form to patient.  [name], RN, BSN, CEN [facility] [employer] [phone]

## 2014-10-10 NOTE — Patient Outreach (Signed)
New referral from MD.  Screening call made. Spoke with patient on her mobile phone. She requested I call her back in an hour on her home phone.  Will call patient later in the day.  [name], RN, BSN, CEN [facility] [employer] [phone]

## 2014-10-11 ENCOUNTER — Encounter: Payer: Self-pay | Admitting: *Deleted

## 2014-10-11 NOTE — Patient Outreach (Signed)
[facility]) Care Management  10/11/2014  Pamela Collier 05/08/1941 [id number]   Notification from [name], RN patient requires SW and Charity fundraiser, assigned [employer], LCSW and [place] Minor, Therapist, sports.  [name]. [place], [place] Assistant Phone: [phone] Fax: [phone]

## 2014-10-12 ENCOUNTER — Encounter: Payer: Self-pay | Admitting: Pulmonary Disease

## 2014-10-12 ENCOUNTER — Ambulatory Visit
Admission: RE | Admit: 2014-10-12 | Discharge: 2014-10-12 | Disposition: A | Discharge status: Home / Self Care | Payer: Medicare Other | Source: Ambulatory Visit | Attending: Pulmonary Disease | Admitting: Pulmonary Disease

## 2014-10-12 ENCOUNTER — Ambulatory Visit (INDEPENDENT_AMBULATORY_CARE_PROVIDER_SITE_OTHER): Payer: Medicare Other | Admitting: Pulmonary Disease

## 2014-10-12 VITALS — BP 122/60 | HR 61 | Ht 66.0 in | Wt 188.0 lb

## 2014-10-12 DIAGNOSIS — J90 Pleural effusion, not elsewhere classified: Secondary | ICD-10-CM | POA: Diagnosis not present

## 2014-10-12 DIAGNOSIS — R0602 Shortness of breath: Secondary | ICD-10-CM | POA: Diagnosis not present

## 2014-10-12 DIAGNOSIS — D71 Functional disorders of polymorphonuclear neutrophils: Secondary | ICD-10-CM | POA: Insufficient documentation

## 2014-10-12 DIAGNOSIS — D860 Sarcoidosis of lung: Secondary | ICD-10-CM | POA: Diagnosis not present

## 2014-10-12 DIAGNOSIS — I255 Ischemic cardiomyopathy: Secondary | ICD-10-CM

## 2014-10-12 DIAGNOSIS — I639 Cerebral infarction, unspecified: Secondary | ICD-10-CM | POA: Diagnosis not present

## 2014-10-12 DIAGNOSIS — I209 Angina pectoris, unspecified: Secondary | ICD-10-CM | POA: Diagnosis not present

## 2014-10-12 DIAGNOSIS — I2510 Atherosclerotic heart disease of native coronary artery without angina pectoris: Secondary | ICD-10-CM | POA: Diagnosis not present

## 2014-10-12 NOTE — Assessment & Plan Note (Signed)
As noted above were going to request records of the CT scan of her chest from [place].  Currently she only gets short of breath when she exerts herself significantly. I think that her dyspnea is primarily related to deconditioning and I agree with the plan from her primary care physician to try to get her into a rehabilitation program.  Plan: Continue using Combivent as needed Have dyspnea does worsen then consider repeating a pulmonary function test, though she's had difficulty with this in the past

## 2014-10-12 NOTE — Patient Instructions (Signed)
We will request a copy of the CT scan from the hospital in [place] We will order a chest x-ray to look at the fluid in your lungs and we will call you with the results Use the Combivent as needed, up to 4 times a day if necessary We will see you back in 4 months or sooner if needed

## 2014-10-12 NOTE — Assessment & Plan Note (Signed)
Multifactorial problem primarily related to deconditioning. We will check a chest x-ray as detailed above.

## 2014-10-12 NOTE — Assessment & Plan Note (Signed)
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 Most recently when she had evidence of an effusion in November I performed a bedside ultrasound which showed no evidence of a pleural effusion. This was followed up with a formal ultrasound which also confirmed that.  Today on physical exam she does have slightly diminished breath sounds in the right base but this is typical for her. If she does have recurrence of the effusion I think I would only perform an ultrasound-guided thoracentesis if she was having symptoms or if it was significantly growing.  She had a CT scan of her chest recently when she was hospitalized in [place] and she was told "I had fluid on my lungs".  Plan: Chest x-ray to look to see if the fluid has recurred Request record of the CT scan of her chest from the hospital in [place] Follow-up 4 months or sooner if needed

## 2014-10-12 NOTE — Assessment & Plan Note (Signed)
Apparently she had a new percutaneous coronary intervention to her right coronary artery when she was hospitalized in March 2016. I have personally reviewed the records from her discharge summary which confirm this. However, there was not a copy of the catheterization report. She saw cardiology today. We will try get records from her cardiology visit today as well as more information when we request records from the hospital in [place].

## 2014-10-12 NOTE — Progress Notes (Signed)
Subjective:    Patient ID: Pamela Collier, female    DOB: 02-04-1941, 74 y.o.   MRN: [id number]  Synopsis: This is a 74 year old female first seen by Dr. Melvyn Novas in the [facility] pulmonary office in January 2014 for pulmonary nodule and some shortness of breath. An ACE inhibitor was held during that visit and followup imaging was planned.  HPI Chief Complaint  Patient presents with  . Follow-up    Pt went to [place] in March 2016 and was hospitalized for 8-9 days. Pt denies cough/sob/wheezing/ct. She is just having alot of back pain this morning.   Went to [place] in March and ended up int he hospital.  She required a PCI to the RCA.  She went to see Dr. Clayborn Bigness this morning.  She says that she thinks that she had fluid on her lungs when she went to the hospital.  She is complaining of back pain.  She had an MRI several months ago.  Her breathing "hasn't been bad" since she returned from [place] but she still has some dyspnea if she walks 1 block at a time. She said yesterday she walked to the mailbox and "it wasn't too bad".  She says she feels too groggy in the mornings to try to participate in a rehab program. She has a very rare cough with occasional white mucus production, but this doesn't happen often.  Maybe 1-2 times per week, and it is not every day.  She had diarrhea when she was in [place], then she had diarrhea twice since then.    Past Medical History  Diagnosis Date  . Diabetes mellitus   . 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 Cardiomyopathy, ischemic Jan 2013    EF 35 to 40% [facility])  . Hepatic steatosis     by CT abd pelvis  . Osteoporosis, post-menopausal   . 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   HENT: Negative for congestion, postnasal drip and rhinorrhea.   Respiratory: Positive for shortness of breath and wheezing. Negative for cough.   Cardiovascular: Negative for chest pain, palpitations and leg swelling.       Objective:   Physical Exam  Filed Vitals:   10/12/14 1114  BP: 122/60  Pulse: 61  Height: _0  (1.676 m)  Weight: 188 lb (85.276 kg)  SpO2: 100%  RA  Gen: well appearing, no acute distress HEENT: NCAT, EOMi, OP clear, PULM: Diminished R base otherwise clear CV: RRR, no mgr, no JVD AB: BS+, soft, nontender, no hsm Ext: warm, no edema, no clubbing, no cyanosis  January 2014 CXR [facility] reviewed by me with vascular congestion and cardiomegally, no mass.  Incidental finding on contrasted chest CT done to rule out pulmonary embolus, [facility] CT chest May 06 2012  RLL as dz RUL ll mm nodule Mulitple LN's some partially calcified 10/22/2012 CT chest >> 11 mm right middle lobe nodule unchanged, borderline enlarged mediastinal lymphadenopathy unchanged, there is a right pleural effusion which is new, there is atelectasis in the right base 03/2013 CT chest > slightly larger R pleural effusion, new trace left pleural effusion, RUL nodule unchanged 12x42m; RLL atelectasis; calcified lymphadenopathy 05/2012 PFT> poor quality, ratio 78%, FEV1 1.02 (  55%pred), FVC 1.3 (48% pred) 01/2014 CT chest > near complete resolution of the pleural effusion, stable lymphadenopathy May 2016 stool microbiology results reviewed, cultures negative, C. 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 Most recently when she had evidence of an effusion in November I performed a bedside ultrasound which showed no evidence of a pleural effusion. This was followed up with a formal ultrasound which also confirmed that.  Today on physical exam she does have  slightly diminished breath sounds in the right base but this is typical for her. If she does have recurrence of the effusion I think I would only perform an ultrasound-guided thoracentesis if she was having symptoms or if it was significantly growing.  She had a CT scan of her chest recently when she was hospitalized in [place] and she was told "I had fluid on my lungs".  Plan: Chest x-ray to look to see if the fluid has recurred Request record of the CT scan of her chest from the hospital in [place] Follow-up 4 months or sooner if needed   Pulmonary sarcoidosis As noted above were going to request records of the CT scan of her chest from [place].  Currently she only gets short of breath when she exerts herself significantly. I think that her dyspnea is primarily related to deconditioning and I agree with the plan from her primary care physician to try to get her into a rehabilitation program.  Plan: Continue using Combivent as needed Have dyspnea does worsen then consider repeating a pulmonary function test, though she's had difficulty with this in the past   SOB (shortness of breath) Multifactorial problem primarily related to deconditioning. We will check a chest x-ray as detailed above.   Cardiomyopathy, ischemic Apparently she had a new percutaneous coronary intervention to her right coronary artery when she was hospitalized in March 2016. I have personally reviewed the records from her discharge summary which confirm this. However, there was not a copy of the catheterization report. She saw cardiology today. We will try get records from her cardiology visit today as well as more information when we request records from the hospital in [place].     Updated Medication List Outpatient Encounter Prescriptions as of 10/12/2014  Medication Sig  . albuterol (PROVENTIL) (2.5 MG/3ML) 0.083% nebulizer solution Take 3 mLs (2.5 mg total) by nebulization every 6 (six) hours as  needed for wheezing or shortness of breath.  [name] alendronate (FOSAMAX) 70 MG tablet Take with a full glass of water on an empty stomach every Sunday  Take with a full glass of water and take no other meds or food for   30 minutes .  [name] ALPRAZolam (XANAX) 0.5 MG tablet Take 1 tablet (0.5 mg total) by mouth daily as needed for sleep or anxiety.  [name] aspirin 81 MG EC tablet Take 1 tablet (81 mg total) by mouth daily. Swallow whole.  . B-D UF III MINI PEN NEEDLES 31G X 5 MM MISC USE AS DIRECTED WITH NOVOLOG FLEXPEN  . Blood Glucose Monitoring Suppl (TRUERESULT BLOOD GLUCOSE) W/DEVICE KIT 1 kit by Does not apply route 3 (three) times daily.  [name] BRILINTA 90 MG TABS tablet Take 1 tablet by mouth 2 (two) times daily.  . fluticasone (FLONASE) 50 MCG/ACT nasal spray Place 2 sprays into the nose daily.  [name]  furosemide (LASIX) 20 MG tablet Take 1 tablet (20 mg total) by mouth daily.  [name] glucose blood (TRUETEST TEST) test strip CHECK BLOOD GLUCOSE THREE TIMES DAILY Dx E11.29  . INS SYRINGE/NEEDLE 1CC/28G 28G X 1/2" 1 ML MISC 1 each by Does not apply route as needed. To test glucose levels daily.  . Insulin Aspart Prot & Aspart (NOVOLOG 70/30 MIX) (70-30) 100 UNIT/ML Pen 44 units before breakfast,  50 units before dinner.  NEEDS 12 PENS PER MONTH  . isosorbide mononitrate (IMDUR) 30 MG 24 hr tablet Take 1 tablet (30 mg total) by mouth daily.  . Lactulose 20 GM/30ML SOLN 30 ml every 4 hours until constipation is relieved  . losartan (COZAAR) 100 MG tablet TAKE ONE TABLET BY MOUTH ONCE DAILY  . meloxicam (MOBIC) 15 MG tablet Take 1 tablet (15 mg total) by mouth daily.  . metoprolol tartrate (LOPRESSOR) 25 MG tablet Take 0.5 tablets (12.5 mg total) by mouth 2 (two) times daily.  [name] PARoxetine (PAXIL) 10 MG tablet Take 1 tablet (10 mg total) by mouth daily.  . pregabalin (LYRICA) 100 MG capsule Take 1 capsule (100 mg total) by mouth 3 (three) times daily.  . rosuvastatin (CRESTOR) 20 MG tablet Take 1 tablet (20 mg total) by  mouth daily.  . traMADol (ULTRAM) 50 MG tablet Take 1 tablet (50 mg total) by mouth every 6 (six) hours as needed for moderate pain.  . TRUEPLUS LANCETS 30G MISC USE TO CHECK GLUCOSE THREE TIMES DAILY  . [DISCONTINUED] sulfamethoxazole-trimethoprim (BACTRIM DS,SEPTRA DS) 800-160 MG per tablet Take 1 tablet by mouth 2 (two) times daily. (Patient not taking: Reported on 10/12/2014)   Facility-Administered Encounter Medications as of 10/12/2014  Medication  . albuterol (PROVENTIL) (5 MG/ML) 0.5% nebulizer solution 2.5 mg

## 2014-10-13 ENCOUNTER — Telehealth: Payer: Self-pay | Admitting: Pulmonary Disease

## 2014-10-13 NOTE — Telephone Encounter (Signed)
Patient notified of CXR results. Nothing further needed.  

## 2014-10-16 ENCOUNTER — Other Ambulatory Visit: Payer: Self-pay | Admitting: *Deleted

## 2014-10-16 NOTE — Patient Outreach (Signed)
Attempted to contact pt. HIPPA compliant voicemail left.   Plan: RNCM will attempt to contact pt tom.   Rutherford Limerick RN, BSN  [facility] Care Management [phone])

## 2014-10-17 ENCOUNTER — Encounter: Payer: Self-pay | Admitting: *Deleted

## 2014-10-17 ENCOUNTER — Other Ambulatory Visit: Payer: Self-pay | Admitting: *Deleted

## 2014-10-17 NOTE — Patient Outreach (Signed)
Pt called RNCM back to set up initial home visit. RNCM let pt know that SW will be coming out with me. Pt stated she would like more guidance on what to eat with her diabetes. Appt made for 6/10 at 9am. Pt gave instructions to home and to come to the back door.    Plan: RNCM will make CMA aware that pt accepted [facility] services. RNCM will Make SW aware of date and time of appt. RNCM will see pt on 6/10.  Rutherford Limerick RN, BSN  [facility] Care Management [phone])

## 2014-10-19 ENCOUNTER — Other Ambulatory Visit: Payer: Self-pay | Admitting: *Deleted

## 2014-10-19 NOTE — Patient Outreach (Signed)
[facility]) Care Management  10/19/2014  Pamela Collier 23-Jan-1941 [id number]   Phone call to patient to re-schedule home visit to 10/26/14 at 3:00pm to address financial needs.   [name] [facility] Care Management [phone]

## 2014-10-20 ENCOUNTER — Other Ambulatory Visit: Payer: Self-pay | Admitting: *Deleted

## 2014-10-20 ENCOUNTER — Encounter: Payer: Self-pay | Admitting: *Deleted

## 2014-10-20 ENCOUNTER — Ambulatory Visit: Payer: Medicare Other | Admitting: *Deleted

## 2014-10-23 NOTE — Patient Outreach (Signed)
[facility]) Care Management   10/23/2014  Pamela Collier July 06, 1940 [id number]  Pamela Collier is an 74 y.o. female  Subjective: " My family does not want me to go a beach trip that I have saved and planned for which is in September. I really want to go." " My family says I should not drive or do anything and I feel frustrated because I need to get out of the house." "My husband has been taking me to my doctor's appointments so I can't talk freely to the doctor in front of him." "I need help learning how to count carbs and eat low salt too. I am trying to do what the doctor says but I am not sure I'm doing it right."   Objective: Blood pressure 120/60, pulse 58, resp. rate 18, weight 191 lb (86.637 kg), SpO2 94 %.   Review of Systems  Constitutional: Negative.   HENT: Negative.   Eyes: Negative.   Cardiovascular: Negative for leg swelling.  Gastrointestinal: Negative for diarrhea.  All other systems reviewed and are negative.   Physical Exam  Vitals reviewed. Constitutional: She is oriented to person, place, and time. She appears well-developed and well-nourished.  Cardiovascular: Regular rhythm.  Bradycardia present.   Pulses:      Dorsalis pedis pulses are 2+ on the right side, and 2+ on the left side.  Respiratory: Breath sounds normal.  GI: Soft. Normal appearance and bowel sounds are normal.  Neurological: She is alert and oriented to person, place, and time.  Skin: Skin is warm, dry and intact.     Psychiatric: Her speech is normal and behavior is normal. Judgment and thought content normal. Her mood appears anxious. Cognition and memory are normal.    Current Medications:   Current Outpatient Prescriptions  Medication Sig Dispense Refill  . albuterol (PROVENTIL) (2.5 MG/3ML) 0.083% nebulizer solution Take 3 mLs (2.5 mg total) by nebulization every 6 (six) hours as needed for wheezing or shortness of breath. 150 mL 1  . alendronate (FOSAMAX) 70 MG  tablet Take with a full glass of water on an empty stomach every Sunday  Take with a full glass of water and take no other meds or food for   30 minutes . 4 tablet 11  . ALPRAZolam (XANAX) 0.5 MG tablet Take 1 tablet (0.5 mg total) by mouth daily as needed for sleep or anxiety. 30 tablet 3  . aspirin 81 MG EC tablet Take 1 tablet (81 mg total) by mouth daily. Swallow whole. 30 tablet 12  . B-D UF III MINI PEN NEEDLES 31G X 5 MM MISC USE AS DIRECTED WITH NOVOLOG FLEXPEN 100 each 6  . Blood Glucose Monitoring Suppl (TRUERESULT BLOOD GLUCOSE) W/DEVICE KIT 1 kit by Does not apply route 3 (three) times daily. 1 each 0  . BRILINTA 90 MG TABS tablet Take 1 tablet by mouth 2 (two) times daily.    . fluticasone (FLONASE) 50 MCG/ACT nasal spray Place 2 sprays into the nose daily. 16 g 6  . furosemide (LASIX) 20 MG tablet Take 1 tablet (20 mg total) by mouth daily. 90 tablet 1  . glucose blood (TRUETEST TEST) test strip CHECK BLOOD GLUCOSE THREE TIMES DAILY Dx E11.29 100 each 0  . INS SYRINGE/NEEDLE 1CC/28G 28G X 1/2" 1 ML MISC 1 each by Does not apply route as needed. To test glucose levels daily.    . Insulin Aspart Prot & Aspart (NOVOLOG 70/30 MIX) (70-30) 100 UNIT/ML Pen 44  units before breakfast,  50 units before dinner.  NEEDS 12 PENS PER MONTH 30 mL 11  . isosorbide mononitrate (IMDUR) 30 MG 24 hr tablet Take 1 tablet (30 mg total) by mouth daily. 90 tablet 1  . Lactulose 20 GM/30ML SOLN 30 ml every 4 hours until constipation is relieved 236 mL 3  . losartan (COZAAR) 100 MG tablet TAKE ONE TABLET BY MOUTH ONCE DAILY 30 tablet 5  . meloxicam (MOBIC) 15 MG tablet Take 1 tablet (15 mg total) by mouth daily. 30 tablet 1  . metoprolol tartrate (LOPRESSOR) 25 MG tablet Take 0.5 tablets (12.5 mg total) by mouth 2 (two) times daily. 180 tablet 1  . PARoxetine (PAXIL) 10 MG tablet Take 1 tablet (10 mg total) by mouth daily. 90 tablet 1  . pregabalin (LYRICA) 100 MG capsule Take 1 capsule (100 mg total) by mouth  3 (three) times daily. 270 capsule 3  . rosuvastatin (CRESTOR) 20 MG tablet Take 1 tablet (20 mg total) by mouth daily. 30 tablet 11  . TRUEPLUS LANCETS 30G MISC USE TO CHECK GLUCOSE THREE TIMES DAILY 400 each 0  . traMADol (ULTRAM) 50 MG tablet Take 1 tablet (50 mg total) by mouth every 6 (six) hours as needed for moderate pain. 120 tablet 3   No current facility-administered medications for this visit.   Facility-Administered Medications Ordered in Other Visits  Medication Dose Route Frequency Provider Last Rate Last Dose  . albuterol (PROVENTIL) (5 MG/ML) 0.5% nebulizer solution 2.5 mg  2.5 mg Nebulization Once [name], MD        Functional Status:   In your present state of health, do you have any difficulty performing the following activities: 10/20/2014  Hearing? N  Vision? N  Difficulty concentrating or making decisions? N  Walking or climbing stairs? Y  Dressing or bathing? N  Doing errands, shopping? N  Preparing Food and eating ? N  Using the Toilet? N  In the past six months, have you accidently leaked urine? N  Do you have problems with loss of bowel control? N  Managing your Medications? N  Managing your Finances? Y  Housekeeping or managing your Housekeeping? Y    Fall/Depression Screening:    PHQ 2/9 Scores 10/20/2014 07/23/2014 11/22/2013 08/03/2013 04/03/2012  PHQ - 2 Score 3 1 2  0 2  PHQ- 9 Score 5 - 3 - 10  Exception Documentation - Other- indicate reason in comment box - - [facility] CM Care Plan Problem One        Patient Outreach from 10/20/2014 in [place] Problem One  Pt would like to improve health to be able to enjoy and participate in a family beach trip in September.   Care Plan for Problem One  Active   [facility] Long Term Goal (31-90 days)  Pt will not be admitted to the hospital in the next 90 days   [facility] Long Term Goal Start Date  10/20/14   Interventions for Problem One Long Term Goal  Medication review done with pt.  Education on heart healthy and diabetic diet. Education on the importance of exercise. [facility] calendar given and with HF, COPD and diabetic  teaching given.   [facility] CM Short Term Goal #1 (0-30 days)  Pt will be able to identify and count carbs in the next 30 days.    [facility] CM Short Term Goal #1 Start Date  10/20/14   Interventions for Short Term Goal #  1  EMMI on diabetic diet, carb Counting sheet.   [facility] CM Short Term Goal #2 (0-30 days)  Pt will walk to park beside her home and exercise 3-4 times week for next 30 days    [facility] CM Short Term Goal #2 Start Date  10/20/14   Interventions for Short Term Goal #2  Education on the benefits of exercise.     Assessment: General: A&Ox3, pleasant pt. Pt noted to be agitated/anxious by her husband and children telling her she could not drive nor go on a family beach trip that she had been apparently been planning for some time. Pt made mention of this several times during the visit. Pt's home was equipped with several safety features including grab bars in the bathroom. Only a few steps for pt to contend with. Pt stated she had felt weak and would really like to get her strength up before her trip.   DM: Pt checking sugars BID and noted to have a good understanding of diabetic care. She sees a podiatrist, dentist and eye doctor regularly. She has an outdated glucometer which she plans to replace. Pt was interested in diabetic classes provided by the lifestyle center at [facility], but feels she can't go until she is allowed to drive again.   COPD: Lungs clear at this time throughout all lung fields. Pt reports taking nebulized medications as prescribed. She reports SOB when exercising but is aware of her limitations and when to rest.   HF: Lungs clear, no swelling noted to extremities, but pt reports SOB with exertion. Pt reports she can sleep lying down in her bed with her c-pap machine with out difficulty. Pt taking heart medications as prescribed and diuretic as prescribed.  Pt frustrated by her current cardiologist related to the fact she had just been to him before her trip to [place] and he assured her she was fine. While in [place] she became sick and needed a stent placed for blockage. Pt noted to be following a heart healthy and low sodium diet.   RNCM and pt went into her kitchen and reviewed sodium and carb amounts in several of her reported favorite foods. Pt eager to learn and gain her independence back since her recent hospitalization.      Plan: Pt plans to record weights, blood pressure and blood sugars in [facility] calendar for the next 30 days.  Pt plans to increase exercise and knowledge of carb counting and sodium intake over the next 30 days. RNCM will print and take pt more information about carb counting, low sodium and heart healthy diet.  RNCM will check into the [facility] and their payment schedule for medicare pts. RNCM will talk with primary care MD about possibility of Diabetic classes, PT and the pt's ability to drive.  RNCM will make primary care physician aware of [facility] involvement. RNCM scheduled f/u visit with pt in 1 month.

## 2014-10-24 ENCOUNTER — Other Ambulatory Visit: Payer: Self-pay | Admitting: Internal Medicine

## 2014-10-24 DIAGNOSIS — E1129 Type 2 diabetes mellitus with other diabetic kidney complication: Secondary | ICD-10-CM

## 2014-10-24 DIAGNOSIS — IMO0002 Reserved for concepts with insufficient information to code with codable children: Secondary | ICD-10-CM

## 2014-10-24 DIAGNOSIS — E1165 Type 2 diabetes mellitus with hyperglycemia: Secondary | ICD-10-CM

## 2014-10-25 ENCOUNTER — Other Ambulatory Visit: Payer: Self-pay | Admitting: *Deleted

## 2014-10-25 NOTE — Patient Outreach (Signed)
RNCM placed return phone call to pt. Pt stated the breathing treatment was helpful and she felt better. Pt stated she was going to rest for today and if she felt worse would call the MD office. RNCM discussed s/s of UTI, RNCM discussed with pt the weather being hard on people with breathing difficulties. Pt voiced understanding. RNCM requested to come by to see pt in the am to drop off Advanced directive information and more educational materials on healthy diet.   Plan: RNCM will see pt for a brief visit in the am to drop off supplies.   Rutherford Limerick RN, BSN  [facility] Care Management [phone])

## 2014-10-25 NOTE — Patient Outreach (Signed)
RNCM received a phone call from pt. Pt c/o not feeling well, having some trouble breathing and noticing she had to void several times during the night. Pt had checked sugar, weight and b/p and reported all were WNL. After talking pt through her symptoms RNCM and pt talked about pt using her nebulizer to improve her symptoms. Pt agreed.   Plan: RNCM will call pt in approx an hour to see if she had some improvement.  Rutherford Limerick RN, BSN  [facility] Care Management [phone])

## 2014-10-26 ENCOUNTER — Ambulatory Visit: Payer: Medicare Other | Admitting: *Deleted

## 2014-10-27 ENCOUNTER — Other Ambulatory Visit: Payer: Self-pay

## 2014-10-27 ENCOUNTER — Encounter: Payer: Self-pay | Admitting: *Deleted

## 2014-10-27 MED ORDER — MELOXICAM 15 MG PO TABS: 15.0000 mg | ORAL_TABLET | Freq: Every day | ORAL | Status: DC

## 2014-10-27 NOTE — Telephone Encounter (Signed)
Ok to refill,  Refill sent  

## 2014-10-27 NOTE — Patient Outreach (Signed)
[facility]) Care Management  10/27/2014  Pamela Collier Oct 08, 1940 [id number]   RNCM took education materials to pts home on 10/26/14 per discussed plan. Pt stated she was feeling better from previous day. RNCM briefly went through education and answered pt's questions. RNCM requested pt be familiar with information for discussion at next home visit.RNCM also reported to pt that her MD stated she felt it was ok for pt to drive.   Plan: RNCM will see pt at routine home visit in July. RNCM will place a care coordination phone call to SW and request she take pt Advance Directive paper work per pt request.  Rutherford Limerick RN, BSN  [facility] Care Management [phone])

## 2014-10-27 NOTE — Telephone Encounter (Signed)
Ok to refill, last OV 5.24.16

## 2014-10-30 ENCOUNTER — Encounter: Payer: Self-pay | Admitting: Dietician

## 2014-10-30 ENCOUNTER — Encounter: Payer: Medicare Other | Attending: Internal Medicine | Admitting: Dietician

## 2014-10-30 VITALS — BP 116/41 | Ht 66.5 in | Wt 190.0 lb

## 2014-10-30 DIAGNOSIS — E1129 Type 2 diabetes mellitus with other diabetic kidney complication: Secondary | ICD-10-CM | POA: Diagnosis not present

## 2014-10-30 DIAGNOSIS — I255 Ischemic cardiomyopathy: Secondary | ICD-10-CM

## 2014-10-30 DIAGNOSIS — E1142 Type 2 diabetes mellitus with diabetic polyneuropathy: Secondary | ICD-10-CM

## 2014-10-30 NOTE — Progress Notes (Signed)
Diabetes Self-Management Education  Visit Type: First/Initial  Appt. Start Time: 1030 Appt. End Time: [id number]  10/30/2014  [name], identified by name and date of birth, is a 74 y.o. female with a diagnosis of Diabetes: Type 2.  Other people present during visit:      ASSESSMENT  Blood pressure 116/41, height 5' 6.5" (1.689 m), weight 190 lb (86.183 kg). Body mass index is 30.21 kg/(m^2).  Pt uses old True Result meter and checks BG's 2x/day with results 140's-180's  Initial Visit Information:  Are you currently following a meal plan?: Yes What type of meal plan do you follow?:  (smaller portions, more vegetables, and less sweets) Are you taking your medications as prescribed?: Yes Are you checking your feet?: Yes How many days per week are you checking your feet?: 7 How often do you need to have someone help you when you read instructions, pamphlets, or other written materials from your doctor or pharmacy?: 1 - Never    Psychosocial:        Complications:   How often do you check your blood sugar?: 1-2 times/day (2x/day ac meals with results 140's-180's) Fasting Blood glucose range (mg/dL): 130-179, 180-200 Have you had a dilated eye exam in the past 12 months?: Yes Have you had a dental exam in the past 12 months?: Yes  Diet Intake:  Breakfast:  (eats 3 meals/day) Snack (afternoon):  (crackers with pnut butter or pretzels) Snack (evening):  (fruit or sugar free ice cream) Beverage(s):  (drinks mostly water and occasinoal diet drink, drinks occasional cranberry juice)  Exercise:  Exercise: ADL's, Light (walking / raking leaves) Light Exercise amount of time (min / week): 60 (walks 20 min 3x/wk)  Individualized Plan for Diabetes Self-Management Training:   Learning Objective:  Patient will have a greater understanding of diabetes self-management.  Patient education plan per assessed needs and concerns is to attend individual sessions for     Education  Topics Reviewed with Patient Today:  Explored patient's options for treatment of their diabetes, Definition of diabetes, type 1 and 2, and the diagnosis of diabetes Role of diet in the treatment of diabetes and the relationship between the three main macronutrients and blood glucose level, Food label reading, portion sizes and measuring food., Carbohydrate counting, Reviewed blood glucose goals for pre and post meals and how to evaluate the patients' food intake on their blood glucose level. Role of exercise on diabetes management, blood pressure control and cardiac health., Helped patient identify appropriate exercises in relation to his/her diabetes, diabetes complications and other health issue. Taught/reviewed insulin injection, site rotation, insulin storage and needle disposal., Reviewed patients medication for diabetes, action, purpose, timing of dose and side effects. Taught/evaluated SMBG meter-gave pt a new True Result meter and reviewed its use-check BG with results 166 (2 hr pp). Reviewed Purpose and frequency of SMBG., Daily foot exams, Yearly dilated eye exam, Identified appropriate SMBG and/or A1C goals. Taught treatment of hypoglycemia - the 15 rule. Relationship between chronic complications and blood glucose control, Identified and discussed with patient  current chronic complications     Helped patient develop diabetes management plan for (enter comment)  PATIENTS GOALS/Plan (Developed by the patient):  Nutrition: Follow meal plan discussed (eat 3 balanced meals/day and small bedtime snack) Physical Activity: Exercise 3-5 times per week Medications: take my medication as prescribed Monitoring :  (check BG before breakfast and supper and record ) Reducing Risk: examine blood glucose patterns, do foot checks daily, treat hypoglycemia with  15 grams of carbs if blood glucose less than 70mg /dL (carry candy or glucose tablets at all times; carry medical alert ID (card given to  pt))  Plan:   Patient Instructions   Carry medical alert ID at all times Carry candy or glucose tablets at all times Check blood sugars 2 x day before breakfast and before supper every day Exercise: continue to walk as able  for    20+ minutes   3-4 days a week Avoid sugar sweetened drinks (soda, tea, coffee, sports drinks, juices) Limit intake of fried foods and sweets/desserts Eat 3 meals day,   1  snacks a day at bedtime Space meals 4-5 hours apart Complete 3 Day Food Record and bring to next appt Bring blood sugar records to the next appointment Rotate injection sites Return for FU appt with RD on 11-15-14   Expected Outcomes:     Education material provided: general meal planning guidelines, treatment of low BG's, medical alert ID card, gave pt True Result meter and reviewed its use  If problems or questions, patient to contact team via:  [phone]  Future DSME appointment: 2 wks (11-15-14 with RD)

## 2014-10-30 NOTE — Addendum Note (Signed)
Addended by: Derry Lory on: 10/30/2014 07:30 PM   Modules accepted: Medications

## 2014-10-30 NOTE — Patient Instructions (Addendum)
  Carry medical alert ID at all times Carry candy or glucose tablets at all times Check blood sugars 2 x day before breakfast and before supper every day Exercise: continue to walk as able  for    20+ minutes   3-4 days a week Avoid sugar sweetened drinks (soda, tea, coffee, sports drinks, juices) Limit intake of fried foods and sweets/desserts Eat 3 meals day,   1  snacks a day at bedtime Space meals 4-5 hours apart Complete 3 Day Food Record and bring to next appt Bring blood sugar records to the next appointment Rotate injection sites Continue to Check feet daily  Return for FU appt with RD on 11-15-14

## 2014-11-02 ENCOUNTER — Other Ambulatory Visit: Payer: Self-pay | Admitting: *Deleted

## 2014-11-02 ENCOUNTER — Emergency Department
Admission: EM | Admit: 2014-11-02 | Discharge: 2014-11-02 | Disposition: A | Discharge status: Home / Self Care | Payer: Medicare Other | Attending: Emergency Medicine | Admitting: Emergency Medicine

## 2014-11-02 ENCOUNTER — Encounter: Payer: Self-pay | Admitting: Urgent Care

## 2014-11-02 ENCOUNTER — Ambulatory Visit: Payer: Medicare Other | Admitting: *Deleted

## 2014-11-02 ENCOUNTER — Emergency Department: Payer: Medicare Other

## 2014-11-02 ENCOUNTER — Encounter: Payer: Self-pay | Admitting: *Deleted

## 2014-11-02 ENCOUNTER — Telehealth: Payer: Self-pay | Admitting: Internal Medicine

## 2014-11-02 ENCOUNTER — Other Ambulatory Visit: Payer: Self-pay

## 2014-11-02 DIAGNOSIS — E1159 Type 2 diabetes mellitus with other circulatory complications: Secondary | ICD-10-CM | POA: Diagnosis not present

## 2014-11-02 DIAGNOSIS — E1129 Type 2 diabetes mellitus with other diabetic kidney complication: Secondary | ICD-10-CM | POA: Diagnosis not present

## 2014-11-02 DIAGNOSIS — Z794 Long term (current) use of insulin: Secondary | ICD-10-CM | POA: Insufficient documentation

## 2014-11-02 DIAGNOSIS — K5730 Diverticulosis of large intestine without perforation or abscess without bleeding: Secondary | ICD-10-CM | POA: Insufficient documentation

## 2014-11-02 DIAGNOSIS — A09 Infectious gastroenteritis and colitis, unspecified: Secondary | ICD-10-CM | POA: Insufficient documentation

## 2014-11-02 DIAGNOSIS — Z791 Long term (current) use of non-steroidal anti-inflammatories (NSAID): Secondary | ICD-10-CM | POA: Diagnosis not present

## 2014-11-02 DIAGNOSIS — Z7982 Long term (current) use of aspirin: Secondary | ICD-10-CM | POA: Insufficient documentation

## 2014-11-02 DIAGNOSIS — I10 Essential (primary) hypertension: Secondary | ICD-10-CM | POA: Diagnosis not present

## 2014-11-02 DIAGNOSIS — Z79899 Other long term (current) drug therapy: Secondary | ICD-10-CM | POA: Insufficient documentation

## 2014-11-02 DIAGNOSIS — Z7951 Long term (current) use of inhaled steroids: Secondary | ICD-10-CM | POA: Insufficient documentation

## 2014-11-02 DIAGNOSIS — R197 Diarrhea, unspecified: Secondary | ICD-10-CM | POA: Diagnosis present

## 2014-11-02 DIAGNOSIS — E1121 Type 2 diabetes mellitus with diabetic nephropathy: Secondary | ICD-10-CM | POA: Insufficient documentation

## 2014-11-02 DIAGNOSIS — R0789 Other chest pain: Secondary | ICD-10-CM | POA: Insufficient documentation

## 2014-11-02 LAB — C DIFFICILE QUICK SCREEN W PCR REFLEX
C Diff antigen: NEGATIVE
C Diff interpretation: NEGATIVE
C Diff toxin: NEGATIVE

## 2014-11-02 LAB — CBC
HCT: 34.8 % — ABNORMAL LOW (ref 35.0–47.0)
Hemoglobin: 11.1 g/dL — ABNORMAL LOW (ref 12.0–16.0)
MCH: 28.7 pg (ref 26.0–34.0)
MCHC: 31.9 g/dL — ABNORMAL LOW (ref 32.0–36.0)
MCV: 89.9 fL (ref 80.0–100.0)
PLATELETS: 277 10*3/uL (ref 150–440)
RBC: 3.87 MIL/uL (ref 3.80–5.20)
RDW: 15.5 % — ABNORMAL HIGH (ref 11.5–14.5)
WBC: 6.4 10*3/uL (ref 3.6–11.0)

## 2014-11-02 LAB — BASIC METABOLIC PANEL
Anion gap: 8 (ref 5–15)
BUN: 39 mg/dL — ABNORMAL HIGH (ref 6–20)
CHLORIDE: 102 mmol/L (ref 101–111)
CO2: 27 mmol/L (ref 22–32)
CREATININE: 1.54 mg/dL — AB (ref 0.44–1.00)
Calcium: 9.8 mg/dL (ref 8.9–10.3)
GFR calc Af Amer: 37 mL/min — ABNORMAL LOW (ref >60)
GFR calc non Af Amer: 32 mL/min — ABNORMAL LOW (ref >60)
Glucose, Bld: 121 mg/dL — ABNORMAL HIGH (ref 65–99)
Potassium: 5.1 mmol/L (ref 3.5–5.1)
SODIUM: 137 mmol/L (ref 135–145)

## 2014-11-02 LAB — TROPONIN I: Troponin I: <0.03 ng/mL (ref <0.031)

## 2014-11-02 MED ORDER — CIPROFLOXACIN HCL 500 MG PO TABS: 500.0000 mg | ORAL_TABLET | Freq: Two times a day (BID) | ORAL | Status: AC

## 2014-11-02 MED ORDER — MORPHINE SULFATE 2 MG/ML IJ SOLN
2.0000 mg | Freq: Once | INTRAMUSCULAR | Status: AC
  Administered 2014-11-02: 2 mg via INTRAVENOUS

## 2014-11-02 MED ORDER — GLUCOSE BLOOD VI STRP: ORAL_STRIP | Status: DC

## 2014-11-02 MED ORDER — IOHEXOL 240 MG/ML SOLN
25.0000 mL | Freq: Once | INTRAMUSCULAR | Status: AC | PRN
  Administered 2014-11-02: 25 mL via ORAL

## 2014-11-02 MED ORDER — MORPHINE SULFATE 2 MG/ML IJ SOLN
INTRAMUSCULAR | Status: AC
  Administered 2014-11-02: 2 mg via INTRAVENOUS
  Filled 2014-11-02: qty 1

## 2014-11-02 MED ORDER — METRONIDAZOLE 250 MG PO TABS: 250.0000 mg | ORAL_TABLET | Freq: Two times a day (BID) | ORAL | Status: AC

## 2014-11-02 MED ORDER — IOHEXOL 300 MG/ML  SOLN
80.0000 mL | Freq: Once | INTRAMUSCULAR | Status: AC | PRN
  Administered 2014-11-02: 80 mL via INTRAVENOUS

## 2014-11-02 NOTE — Patient Outreach (Signed)
[facility]) Care Management  11/02/2014  Pamela Collier 10/24/1940 [id number]   Patient phoned nurse line, message to [name], RN [name], RN) to follow up with patient.  [name]. [name], [name] Management [name] Assistant Phone: [phone] Fax: [phone]

## 2014-11-02 NOTE — ED Notes (Signed)
Patient transported to CT 

## 2014-11-02 NOTE — Patient Outreach (Signed)
Telephone call ( f/u as this RN CM covering for [employer]- pt called nurse line last night, instructed to f/u at ED).     Called pt's home, spouse answered, states pt is sleeping now.   Discussed with spouse  f/u on pt's ED visit (6/23) as this RN CM covering for [employer] (pt's community case manager) to which spouse states everything is fine, diarrhea stopped.  Spouse states told it  was an infection to which RN CM inquired if picked up the 2 antibiotics.  Spouse states antibiotics were picked up this morning.  Spouse states he and spouse were at the ED from 2 am until 6 am, reason pt is sleeping.  Spouse reports pt did call MD office to schedule f/u appointment.  RN CM informed spouse [name] that  Merlene Morse RN will be f/u with pt next month.      Plan to provide Janci RN on her return- update on pt.     Zara Chess.   [place] [facility]  [phone]

## 2014-11-02 NOTE — ED Notes (Signed)
Enteric precautions discontinued. C.diff PCR results back and are NEGATIVE.

## 2014-11-02 NOTE — Discharge Instructions (Signed)
Diarrhea Diarrhea is frequent loose and watery bowel movements. It can cause you to feel weak and dehydrated. Dehydration can cause you to become tired and thirsty, have a dry mouth, and have decreased urination that often is dark yellow. Diarrhea is a sign of another problem, most often an infection that will not last long. In most cases, diarrhea typically lasts 2-3 days. However, it can last longer if it is a sign of something more serious. It is important to treat your diarrhea as directed by your caregiver to lessen or prevent future episodes of diarrhea. CAUSES  Some common causes include:  Gastrointestinal infections caused by viruses, bacteria, or parasites.  Food poisoning or food allergies.  Certain medicines, such as antibiotics, chemotherapy, and laxatives.  Artificial sweeteners and fructose.  Digestive disorders. HOME CARE INSTRUCTIONS  Ensure adequate fluid intake (hydration): Have 1 cup (8 oz) of fluid for each diarrhea episode. Avoid fluids that contain simple sugars or sports drinks, fruit juices, whole milk products, and sodas. Your urine should be clear or pale yellow if you are drinking enough fluids. Hydrate with an oral rehydration solution that you can purchase at pharmacies, retail stores, and online. You can prepare an oral rehydration solution at home by mixing the following ingredients together:   - tsp table salt.   tsp baking soda.   tsp salt substitute containing potassium chloride.  1  tablespoons sugar.  1 L (34 oz) of water.  Certain foods and beverages may increase the speed at which food moves through the gastrointestinal (GI) tract. These foods and beverages should be avoided and include:  Caffeinated and alcoholic beverages.  High-fiber foods, such as raw fruits and vegetables, nuts, seeds, and whole grain breads and cereals.  Foods and beverages sweetened with sugar alcohols, such as xylitol, sorbitol, and mannitol.  Some foods may be well  tolerated and may help thicken stool including:  Starchy foods, such as rice, toast, pasta, low-sugar cereal, oatmeal, grits, baked potatoes, crackers, and bagels.  Bananas.  Applesauce.  Add probiotic-rich foods to help increase healthy bacteria in the GI tract, such as yogurt and fermented milk products.  Wash your hands well after each diarrhea episode.  Only take over-the-counter or prescription medicines as directed by your caregiver.  Take a warm bath to relieve any burning or pain from frequent diarrhea episodes. SEEK IMMEDIATE MEDICAL CARE IF:   You are unable to keep fluids down.  You have persistent vomiting.  You have blood in your stool, or your stools are black and tarry.  You do not urinate in 6-8 hours, or there is only a small amount of very dark urine.  You have abdominal pain that increases or localizes.  You have weakness, dizziness, confusion, or light-headedness.  You have a severe headache.  Your diarrhea gets worse or does not get better.  You have a fever or persistent symptoms for more than 2-3 days.  You have a fever and your symptoms suddenly get worse. MAKE SURE YOU:   Understand these instructions.  Will watch your condition.  Will get help right away if you are not doing well or get worse. Document Released: 04/18/2002 Document Revised: 09/12/2013 Document Reviewed: 01/04/2012 [facility] Patient Information 2015 [place], [place]. This information is not intended to replace advice given to you by your health care provider. Make sure you discuss any questions you have with your health care provider.  Diverticulosis Diverticulosis is the condition that develops when small pouches (diverticula) form in the wall of  your colon. Your colon, or large intestine, is where water is absorbed and stool is formed. The pouches form when the inside layer of your colon pushes through weak spots in the outer layers of your colon. CAUSES  No one knows exactly  what causes diverticulosis. RISK FACTORS  Being older than 38. Your risk for this condition increases with age. Diverticulosis is rare in people younger than 40 years. By age 43, almost everyone has it.  Eating a low-fiber diet.  Being frequently constipated.  Being overweight.  Not getting enough exercise.  Smoking.  Taking over-the-counter pain medicines, like aspirin and ibuprofen. SYMPTOMS  Most people with diverticulosis do not have symptoms. DIAGNOSIS  Because diverticulosis often has no symptoms, health care providers often discover the condition during an exam for other colon problems. In many cases, a health care provider will diagnose diverticulosis while using a flexible scope to examine the colon (colonoscopy). TREATMENT  If you have never developed an infection related to diverticulosis, you may not need treatment. If you have had an infection before, treatment may include:  Eating more fruits, vegetables, and grains.  Taking a fiber supplement.  Taking a live bacteria supplement (probiotic).  Taking medicine to relax your colon. HOME CARE INSTRUCTIONS   Drink at least 6-8 glasses of water each day to prevent constipation.  Try not to strain when you have a bowel movement.  Keep all follow-up appointments. If you have had an infection before:  Increase the fiber in your diet as directed by your health care provider or dietitian.  Take a dietary fiber supplement if your health care provider approves.  Only take medicines as directed by your health care provider. SEEK MEDICAL CARE IF:   You have abdominal pain.  You have bloating.  You have cramps.  You have not gone to the bathroom in 3 days. SEEK IMMEDIATE MEDICAL CARE IF:   Your pain gets worse.  Yourbloating becomes very bad.  You have a fever or chills, and your symptoms suddenly get worse.  You begin vomiting.  You have bowel movements that are bloody or black. MAKE SURE  YOU:  Understand these instructions.  Will watch your condition.  Will get help right away if you are not doing well or get worse. Document Released: 01/24/2004 Document Revised: 05/03/2013 Document Reviewed: 03/23/2013 [facility] Patient Information 2015 [place], [place]. This information is not intended to replace advice given to you by your health care provider. Make sure you discuss any questions you have with your health care provider.

## 2014-11-02 NOTE — Patient Outreach (Signed)
[facility] Care Management  11/02/2014  Pamela Collier 14-Nov-1940 [id number]   This social worker arrived to patient's home today for scheduled appointment, however patient was not at home.  Voicemail message left for a return call to re-schedule home visit to discuss Advanced Directive and to assess patient for community resources to address patient's financial difficulties.     [name] [facility] Care Management [phone]

## 2014-11-02 NOTE — ED Notes (Signed)
Patient presents with c/o LEFT sided CP with (+) SOB for the last few hours. (+) diarrhea reported - has been 10 times in the last few hours.

## 2014-11-02 NOTE — ED Notes (Signed)

## 2014-11-02 NOTE — ED Provider Notes (Signed)
[facility] Provider Note  ____________________________________________  Time seen: 2:45 AM  I have reviewed the triage vital signs and the nursing notes.   HISTORY  Chief Complaint Diarrhea and Chest Pain     HPI Pamela Collier is a 74 y.o. female presents with multiple episodes of nonbloody diarrhea with onset today. She also admits to 7 out of 10 abdominal pain.     Past Medical History  Diagnosis Date  . Diabetes mellitus   . 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 Cardiomyopathy, ischemic Jan 2013    EF 35 to 40% [facility])  . Hepatic steatosis     by CT abd pelvis  . Osteoporosis, post-menopausal   . 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 Hypertension   . Hyperlipidemia   . Sarcoidosis   . CHF (congestive heart failure)   . Renal insufficiency     Patient Active Problem List   Diagnosis Date Noted  . UTI (urinary tract infection) 10/04/2014  . Antibiotic-associated diarrhea 10/03/2014  . Low back pain with radiation 04/04/2014  . Abdominal pain, suprapubic 04/04/2014  . Essential hypertension 04/04/2014  . Hyperlipidemia LDL goal <100 02/23/2014  . Long-term use of high-risk medication 02/23/2014  . OP (osteoporosis) 08/17/2013  . Dermatophytic onychia 08/17/2013  . Arthritis 08/05/2013  . Unsteady gait 03/24/2013  . History of colonic polyps 02/20/2013  . Need for prophylactic vaccination and inoculation against influenza 02/03/2013  . OSA (obstructive sleep apnea) 12/07/2012  . 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 Dysphagia 12/07/2012  . Pleural effusion 10/25/2012  . Cough 10/25/2012  . Hospital discharge follow-up 09/17/2012  . SOB (shortness of breath) 05/27/2012  . Pulmonary nodule seen on imaging study  05/20/2012  . 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 Cardiomyopathy, ischemic   . Hepatic steatosis   . Osteoporosis, post-menopausal   . 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 DM type 2, uncontrolled, with renal complications 24/58/0998  . Double vessel coronary artery disease 12/14/2011    Past Surgical History  Procedure Laterality Date  . Abdominal hysterectomy      at ge 39. secondary to bleeding  . Toe amputation  Sept 2012    Right 2nd toe, [place]  . Ptca  August 2012    Right Posterior tibial artery , [place]  . Coronary angioplasty with stent placement      Current Outpatient Rx  Name  Route  Sig  Dispense  Refill  . albuterol (PROVENTIL) (2.5 MG/3ML) 0.083% nebulizer solution   Nebulization   Take 3 mLs (2.5 mg total) by nebulization every 6 (six) hours as needed for wheezing or shortness of breath.   150 mL   1   . alendronate (FOSAMAX) 70 MG tablet      Take with a full glass of water on an empty stomach every Sunday  Take with a full glass of water and take no other meds or food for   30 minutes .   4 tablet   11   . ALPRAZolam (XANAX) 0.5 MG tablet   Oral   Take 1 tablet (0.5 mg total) by mouth daily as needed for sleep or anxiety.   30 tablet  3   . aspirin 81 MG EC tablet   Oral   Take 1 tablet (81 mg total) by mouth daily. Swallow whole.   30 tablet   12   . B-D UF III MINI PEN NEEDLES 31G X 5 MM MISC      USE AS DIRECTED WITH NOVOLOG FLEXPEN   100 each   6   . Blood Glucose Monitoring Suppl (TRUERESULT BLOOD GLUCOSE) W/DEVICE KIT   Does not apply   1 kit by Does not apply route 3 (three) times daily.   1 each   0     DX : 250.00   . BRILINTA 90 MG TABS tablet   Oral   Take 1 tablet by mouth 2 (two) times daily.           Dispense as written.   . fluticasone (FLONASE) 50 MCG/ACT nasal spray   Nasal   Place 2 sprays into the nose daily.   16 g   6   .  furosemide (LASIX) 20 MG tablet   Oral   Take 1 tablet (20 mg total) by mouth daily.   90 tablet   1   . glucose blood (TRUETEST TEST) test strip      CHECK BLOOD GLUCOSE THREE TIMES DAILY Dx E11.29   100 each   0   . INS SYRINGE/NEEDLE 1CC/28G 28G X 1/2" 1 ML MISC   Does not apply   1 each by Does not apply route as needed. To test glucose levels daily.         . Insulin Aspart Prot & Aspart (NOVOLOG 70/30 MIX) (70-30) 100 UNIT/ML Pen      44 units before breakfast,  50 units before dinner.  NEEDS 12 PENS PER MONTH Patient taking differently: 44 units before breakfast,  57 units before dinner.  NEEDS 12 PENS PER MONTH   30 mL   11   . isosorbide mononitrate (IMDUR) 30 MG 24 hr tablet   Oral   Take 1 tablet (30 mg total) by mouth daily.   90 tablet   1   . Lactulose 20 GM/30ML SOLN      30 ml every 4 hours until constipation is relieved   236 mL   3     Dispense as written.   [name] losartan (COZAAR) 100 MG tablet      TAKE ONE TABLET BY MOUTH ONCE DAILY   30 tablet   5   . meloxicam (MOBIC) 15 MG tablet   Oral   Take 1 tablet (15 mg total) by mouth daily.   30 tablet   5   . metoprolol tartrate (LOPRESSOR) 25 MG tablet   Oral   Take 0.5 tablets (12.5 mg total) by mouth 2 (two) times daily.   180 tablet   1   . PARoxetine (PAXIL) 10 MG tablet   Oral   Take 1 tablet (10 mg total) by mouth daily.   90 tablet   1   . pregabalin (LYRICA) 100 MG capsule   Oral   Take 1 capsule (100 mg total) by mouth 3 (three) times daily.   270 capsule   3   . rosuvastatin (CRESTOR) 20 MG tablet   Oral   Take 1 tablet (20 mg total) by mouth daily.   30 tablet   11   . traMADol (ULTRAM) 50 MG tablet   Oral   Take 1 tablet (50 mg total) by mouth every 6 (six) hours  as needed for moderate pain.   120 tablet   3   . TRUEPLUS LANCETS 30G MISC      USE TO CHECK GLUCOSE THREE TIMES DAILY   400 each   0   . Vitamin D, Ergocalciferol, (DRISDOL) 50000 UNITS CAPS  capsule   Oral   Take 50,000 Units by mouth every 7 (seven) days.           Allergies Gabapentin and Sulfa antibiotics  Family History  Problem Relation Age of Onset  . Heart disease Father   . Heart disease Sister   . Heart disease Brother   . Asthma Grandchild   . Breast cancer Maternal Aunt     Social History History  Substance Use Topics  . Smoking status: Never Smoker   . Smokeless tobacco: Never Used  . Alcohol Use: No    Review of Systems  Constitutional: Negative for fever. Eyes: Negative for visual changes. ENT: Negative for sore throat. Cardiovascular: Negative for chest pain. Respiratory: Negative for shortness of breath. Gastrointestinal: positive for abdominal pain,and diarrhea. Genitourinary: Negative for dysuria. Musculoskeletal: Negative for back pain. Skin: Negative for rash. Neurological: Negative for headaches, focal weakness or numbness.   10-point ROS otherwise negative.  ____________________________________________   PHYSICAL EXAM:  VITAL SIGNS: ED Triage Vitals  Enc Vitals Group     BP 11/02/14 0321 147/52 mmHg     Pulse Rate 11/02/14 0246 56     Resp 11/02/14 0246 18     Temp --      Temp src --      SpO2 11/02/14 0246 100 %     Weight --      Height --      Head Cir --      Peak Flow --      Pain Score 11/02/14 0043 8     Pain Loc --      Pain Edu? --      Excl. in [place]? --      Constitutional: Alert and oriented. Well appearing and in no distress. Eyes: Conjunctivae are normal. PERRL. Normal extraocular movements. ENT   Head: Normocephalic and atraumatic.   Nose: No congestion/rhinnorhea.   Mouth/Throat: Mucous membranes are moist.   Neck: No stridor. Hematological/Lymphatic/Immunilogical: No cervical lymphadenopathy. Cardiovascular: Normal rate, regular rhythm. Normal and symmetric distal pulses are present in all extremities. No murmurs, rubs, or gallops. Respiratory: Normal respiratory effort without  tachypnea nor retractions. Breath sounds are clear and equal bilaterally. No wheezes/rales/rhonchi. Gastrointestinal: Positive right lower quadrant left lower quadrant pain with palpation. No distention. There is no CVA tenderness. Genitourinary: deferred Musculoskeletal: Nontender with normal range of motion in all extremities. No joint effusions.  No lower extremity tenderness nor edema. Neurologic:  Normal speech and language. No gross focal neurologic deficits are appreciated. Speech is normal.  Skin:  Skin is warm, dry and intact. No rash noted. Psychiatric: Mood and affect are normal. Speech and behavior are normal. Patient exhibits appropriate insight and judgment.  ____________________________________________    LABS (pertinent positives/negatives)  Labs Reviewed  CBC - Abnormal; Notable for the following:    Hemoglobin 11.1 (*)    HCT 34.8 (*)    MCHC 31.9 (*)    RDW 15.5 (*)    All other components within normal limits  BASIC METABOLIC PANEL - Abnormal; Notable for the following:    Glucose, Bld 121 (*)    BUN 39 (*)    Creatinine, Ser 1.54 (*)    GFR  calc non Af Amer 32 (*)    GFR calc Af Amer 37 (*)    All other components within normal limits  C DIFFICILE QUICK SCAN W PCR REFLEX ([facility] ONLY)  TROPONIN I         RADIOLOGY  CT abdomen and pelvis:  IMPRESSION: 1. No acute abnormality seen to explain the patient's symptoms. 2. Mild wall thickening along the distal esophagus is similar in appearance to prior studies, with enlarged surrounding nodes again seen. These are of uncertain significance. Scattered nodes about the celiac trunk and mildly prominent paracaval nodes. This may reflect sequelae of prior granulomatous disease, given relative stability from 2013. 3. Diffuse coronary artery calcifications seen. 4. Mild bibasilar atelectasis noted. 5. Scattered calcification along the abdominal aorta and its branches, including along the course of the  superior mesenteric artery and at the origins of the renal arteries bilaterally. 6. Minimal diverticulosis along the sigmoid colon, without evidence of diverticulitis. ____________________________________________    INITIAL IMPRESSION / ASSESSMENT AND PLAN / ED COURSE  Pertinent labs & imaging results that were available during my care of the patient were reviewed by me and considered in my medical decision making (see chart for details).    ____________________________________________   FINAL CLINICAL IMPRESSION(S) / ED DIAGNOSES  Final diagnoses:  Diarrhea, infectious, adult  Diverticulosis of large intestine without hemorrhage      Gregor Hams, MD 11/02/14 2358

## 2014-11-02 NOTE — ED Notes (Signed)
Patient notified this RN she is finished drinking contrast, CT tech notified.

## 2014-11-02 NOTE — Telephone Encounter (Signed)
Pt called an is needing an er follow up.. Pt was seen for sever diarrhea. Please advise pt

## 2014-11-03 NOTE — Telephone Encounter (Signed)
Pt scheduled for 10:00 on 6.28.16. Called pt and informed her of appt.

## 2014-11-07 ENCOUNTER — Ambulatory Visit (INDEPENDENT_AMBULATORY_CARE_PROVIDER_SITE_OTHER): Payer: Medicare Other | Admitting: Internal Medicine

## 2014-11-07 ENCOUNTER — Encounter: Payer: Self-pay | Admitting: Internal Medicine

## 2014-11-07 VITALS — BP 118/60 | HR 54 | Temp 98.2°F | Resp 12 | Ht 67.0 in | Wt 194.5 lb

## 2014-11-07 DIAGNOSIS — R197 Diarrhea, unspecified: Secondary | ICD-10-CM

## 2014-11-07 DIAGNOSIS — K529 Noninfective gastroenteritis and colitis, unspecified: Secondary | ICD-10-CM | POA: Diagnosis not present

## 2014-11-07 DIAGNOSIS — E1165 Type 2 diabetes mellitus with hyperglycemia: Secondary | ICD-10-CM

## 2014-11-07 DIAGNOSIS — IMO0002 Reserved for concepts with insufficient information to code with codable children: Secondary | ICD-10-CM

## 2014-11-07 DIAGNOSIS — I255 Ischemic cardiomyopathy: Secondary | ICD-10-CM

## 2014-11-07 DIAGNOSIS — E1129 Type 2 diabetes mellitus with other diabetic kidney complication: Secondary | ICD-10-CM | POA: Diagnosis not present

## 2014-11-07 DIAGNOSIS — E1169 Type 2 diabetes mellitus with other specified complication: Secondary | ICD-10-CM

## 2014-11-07 MED ORDER — INSULIN ASPART PROT & ASPART (70-30 MIX) 100 UNIT/ML PEN: PEN_INJECTOR | SUBCUTANEOUS | Status: DC

## 2014-11-07 NOTE — Progress Notes (Signed)
Pre-visit discussion using our clinic review tool. No additional management support is needed unless otherwise documented below in the visit note.  

## 2014-11-07 NOTE — Patient Instructions (Addendum)
Please finish the antibiotics that Dr Owens Shark gave you for at least 7 days   Increase the fiber in your diet.   Metamucil. Miralax , Benefiber and citrucel are all available OTC .  Choose one and take a dose every day to add fiber to   You can drink decaf cooffee   Continue using 44 units of insulin in the morning and increase the dinner dose to 60 units .   I will make a referral to Dr  E ly and Dr Allen Norris to investgate your recurrent diarrhea   Return for fasting labs on August 25th or later,    OV with me in early September

## 2014-11-07 NOTE — Assessment & Plan Note (Signed)
Suspected, as she has had 3 episodes occurring  about a month apart with negative infectiou sworkups.  Refer to Lucilla Lame for further evaluation,

## 2014-11-07 NOTE — Progress Notes (Signed)
Subjective:  Patient ID: Pamela Collier, female    DOB: 10-Sep-1940  Age: 74 y.o. MRN: [id number]  CC: The primary encounter diagnosis was Diarrhea in adult patient. Diagnoses of DM type 2, uncontrolled, with renal complications and Diabetic diarrhea were also pertinent to this visit.  HPI Pamela Collier presents for ER follow up on June 23rd for diarrhea of less than 24 hours duration accompanied by chest/abdominal pain  That was severe.  CT abd and pelvis was done to rule out diverticulitis and was negative for acute changes, and c dif was negative   Was treated  And sent home with rx for cipro 500 bid (Cr 1.54) and flagyl  .  Stools have returned to normal but she states that she still has mild suparubic pain, although less severe  And thinks the meds  are making her dizzy    This is her 3rd episode ,  First occurred while in [place],  Second occurred about a month ago .    C dificile was negative last tmonth as well .  Occurring monthly,    Diabetes log brought in and reviewed. She has been checking post breakfast sugars which have been  normal.  Her dose of insulin (mixed) is 44 units in the AM and 57 units in the evening. She is only receiving 2 boxes of 5 pens each 5 each so running out early.   Outpatient Prescriptions Prior to Visit  Medication Sig Dispense Refill  . albuterol (PROVENTIL) (2.5 MG/3ML) 0.083% nebulizer solution Take 3 mLs (2.5 mg total) by nebulization every 6 (six) hours as needed for wheezing or shortness of breath. 150 mL 1  . alendronate (FOSAMAX) 70 MG tablet Take with a full glass of water on an empty stomach every Sunday  Take with a full glass of water and take no other meds or food for   30 minutes . 4 tablet 11  . ALPRAZolam (XANAX) 0.5 MG tablet Take 1 tablet (0.5 mg total) by mouth daily as needed for sleep or anxiety. 30 tablet 3  . aspirin 81 MG EC tablet Take 1 tablet (81 mg total) by mouth daily. Swallow whole. 30 tablet 12  . B-D UF III MINI PEN NEEDLES  31G X 5 MM MISC USE AS DIRECTED WITH NOVOLOG FLEXPEN 100 each 6  . Blood Glucose Monitoring Suppl (TRUERESULT BLOOD GLUCOSE) W/DEVICE KIT 1 kit by Does not apply route 3 (three) times daily. 1 each 0  . ciprofloxacin (CIPRO) 500 MG tablet Take 1 tablet (500 mg total) by mouth 2 (two) times daily. 20 tablet 0  . fluticasone (FLONASE) 50 MCG/ACT nasal spray Place 2 sprays into the nose daily. 16 g 6  . furosemide (LASIX) 20 MG tablet Take 1 tablet (20 mg total) by mouth daily. 90 tablet 1  . glucose blood (TRUETEST TEST) test strip CHECK BLOOD GLUCOSE THREE TIMES DAILY Dx E11.29 100 each 11  . INS SYRINGE/NEEDLE 1CC/28G 28G X 1/2" 1 ML MISC 1 each by Does not apply route as needed. To test glucose levels daily.    . isosorbide mononitrate (IMDUR) 30 MG 24 hr tablet Take 1 tablet (30 mg total) by mouth daily. 90 tablet 1  . losartan (COZAAR) 100 MG tablet TAKE ONE TABLET BY MOUTH ONCE DAILY 30 tablet 5  . meloxicam (MOBIC) 15 MG tablet Take 1 tablet (15 mg total) by mouth daily. 30 tablet 5  . metoprolol tartrate (LOPRESSOR) 25 MG tablet Take 0.5 tablets (12.5 mg  total) by mouth 2 (two) times daily. 180 tablet 1  . metroNIDAZOLE (FLAGYL) 250 MG tablet Take 1 tablet (250 mg total) by mouth 2 (two) times daily. 20 tablet 0  . PARoxetine (PAXIL) 10 MG tablet Take 1 tablet (10 mg total) by mouth daily. 90 tablet 1  . pregabalin (LYRICA) 100 MG capsule Take 1 capsule (100 mg total) by mouth 3 (three) times daily. 270 capsule 3  . rosuvastatin (CRESTOR) 20 MG tablet Take 1 tablet (20 mg total) by mouth daily. 30 tablet 11  . traMADol (ULTRAM) 50 MG tablet Take 1 tablet (50 mg total) by mouth every 6 (six) hours as needed for moderate pain. 120 tablet 3  . TRUEPLUS LANCETS 30G MISC USE TO CHECK GLUCOSE THREE TIMES DAILY 400 each 0  . Vitamin D, Ergocalciferol, (DRISDOL) 50000 UNITS CAPS capsule Take 50,000 Units by mouth every 7 (seven) days.    . Insulin Aspart Prot & Aspart (NOVOLOG 70/30 MIX) (70-30) 100  UNIT/ML Pen 44 units before breakfast,  50 units before dinner.  NEEDS 12 PENS PER MONTH (Patient taking differently: 44 units before breakfast,  57 units before dinner.  NEEDS 12 PENS PER MONTH) 30 mL 11  . BRILINTA 90 MG TABS tablet Take 1 tablet by mouth 2 (two) times daily.    . Lactulose 20 GM/30ML SOLN 30 ml every 4 hours until constipation is relieved (Patient not taking: Reported on 11/07/2014) 236 mL 3   Facility-Administered Medications Prior to Visit  Medication Dose Route Frequency Provider Last Rate Last Dose  . albuterol (PROVENTIL) (5 MG/ML) 0.5% nebulizer solution 2.5 mg  2.5 mg Nebulization Once [name], MD        Review of Systems;  Patient denies headache, fevers, malaise, unintentional weight loss, skin rash, eye pain, sinus congestion and sinus pain, sore throat, dysphagia,  hemoptysis , cough, dyspnea, wheezing, chest pain, palpitations, orthopnea, edema, abdominal pain, nausea, melena, diarrhea, constipation, flank pain, dysuria, hematuria, urinary  Frequency, nocturia, numbness, tingling, seizures,  Focal weakness, Loss of consciousness,  Tremor, insomnia, depression, anxiety, and suicidal ideation.      Objective:  BP 118/60 mmHg  Pulse 54  Temp(Src) 98.2 F (36.8 C) (Oral)  Resp 12  Ht 5' 7"  (1.702 m)  Wt 194 lb 8 oz (88.225 kg)  BMI 30.46 kg/m2  SpO2 99%  BP Readings from Last 3 Encounters:  11/07/14 118/60  11/02/14 137/57  10/30/14 116/41    Wt Readings from Last 3 Encounters:  11/07/14 194 lb 8 oz (88.225 kg)  10/30/14 190 lb (86.183 kg)  10/20/14 191 lb (86.637 kg)    General appearance: alert, cooperative and appears stated age Ears: normal TM's and external ear canals both ears Throat: lips, mucosa, and tongue normal; teeth and gums normal Neck: no adenopathy, no carotid bruit, supple, symmetrical, trachea midline and thyroid not enlarged, symmetric, no tenderness/mass/nodules Back: symmetric, no curvature. ROM normal. No CVA  tenderness. Lungs: clear to auscultation bilaterally Heart: regular rate and rhythm, S1, S2 normal, no murmur, click, rub or gallop Abdomen: soft, non-tender; bowel sounds normal; no masses,  no organomegaly Pulses: 2+ and symmetric Skin: Skin color, texture, turgor normal. No rashes or lesions Lymph nodes: Cervical, supraclavicular, and axillary nodes normal.  Lab Results  Component Value Date   HGBA1C 8.1* 10/03/2014   HGBA1C 8.4* 06/29/2014   HGBA1C 9.1* 02/23/2014    Lab Results  Component Value Date   CREATININE 1.54* 11/02/2014   CREATININE 1.10 10/03/2014  CREATININE 1.26* 06/29/2014    Lab Results  Component Value Date   WBC 6.4 11/02/2014   HGB 11.1* 11/02/2014   HCT 34.8* 11/02/2014   PLT 277 11/02/2014   GLUCOSE 121* 11/02/2014   CHOL 159 02/23/2014   TRIG 165.0* 02/23/2014   HDL 27.90* 02/23/2014   LDLDIRECT 73.0 10/03/2014   LDLCALC 98 02/23/2014   ALT 13 10/03/2014   AST 16 10/03/2014   NA 137 11/02/2014   K 5.1 11/02/2014   CL 102 11/02/2014   CREATININE 1.54* 11/02/2014   BUN 39* 11/02/2014   CO2 27 11/02/2014   TSH 1.16 07/01/2013   INR 1.03 04/07/2014   HGBA1C 8.1* 10/03/2014   MICROALBUR 61.1* 10/03/2014    Ct Abdomen Pelvis W Contrast  11/02/2014   CLINICAL DATA:  Acute onset of right-sided abdominal pain and chest pain. Shortness of breath. Diarrhea. Initial encounter.  EXAM: CT ABDOMEN AND PELVIS WITH CONTRAST  TECHNIQUE: Multidetector CT imaging of the abdomen and pelvis was performed using the standard protocol following bolus administration of intravenous contrast.  CONTRAST:  32m OMNIPAQUE IOHEXOL 300 MG/ML  SOLN  COMPARISON:  CT of the abdomen and pelvis from 03/28/2014, and lumbar spine MRI performed 07/12/2014  FINDINGS: Mild bibasilar atelectasis is noted. Diffuse coronary artery calcifications are seen. Scattered calcified nodes are noted at the azygoesophageal recess and in a bilateral peribronchial distribution.  Mild wall thickening  along the distal esophagus is similar in appearance to the prior study.  Prominent nodes are seen about the mid to distal esophagus, measuring up to 1.6 cm in short axis. Scattered nodes are noted about the celiac trunk, measuring up to 1.2 cm in short axis. Paracaval nodes measure up to 1.0 cm in short axis.  The liver and spleen are unremarkable in appearance. The gallbladder is within normal limits. The pancreas and adrenal glands are unremarkable.  Nonspecific perinephric stranding is noted bilaterally. The kidneys are otherwise unremarkable. There is no evidence of hydronephrosis. No renal or ureteral stones are seen.  No free fluid is identified. The small bowel is unremarkable in appearance. The stomach is within normal limits. No acute vascular abnormalities are seen. Scattered calcification is noted along the abdominal aorta and its branches, including along the course of the superior mesenteric artery and at the origins of the renal arteries bilaterally.  The appendix is diminutive and unremarkable in appearance. There is no evidence of appendicitis. Minimal diverticulosis is noted along the sigmoid colon, without evidence of diverticulitis.  The bladder is mildly distended and grossly remarkable. The patient is status post hysterectomy. No suspicious adnexal masses are seen. The right ovary is unremarkable in appearance. No inguinal lymphadenopathy is seen.  No acute osseous abnormalities are identified.  IMPRESSION: 1. No acute abnormality seen to explain the patient's symptoms. 2. Mild wall thickening along the distal esophagus is similar in appearance to prior studies, with enlarged surrounding nodes again seen. These are of uncertain significance. Scattered nodes about the celiac trunk and mildly prominent paracaval nodes. This may reflect sequelae of prior granulomatous disease, given relative stability from 2013. 3. Diffuse coronary artery calcifications seen. 4. Mild bibasilar atelectasis noted. 5.  Scattered calcification along the abdominal aorta and its branches, including along the course of the superior mesenteric artery and at the origins of the renal arteries bilaterally. 6. Minimal diverticulosis along the sigmoid colon, without evidence of diverticulitis.   Electronically Signed   By: JGarald BaldingM.D.   On: 11/02/2014 05:09    Assessment &  Plan:   Problem List Items Addressed This Visit      Unprioritized   DM type 2, uncontrolled, with renal complications    Improving,  Will increase the evening dose slightly to 60 units  Continue 44 units in the am       Relevant Medications   insulin aspart protamine - aspart (NOVOLOG 70/30 MIX) (70-30) 100 UNIT/ML FlexPen   Diabetic diarrhea    Suspected, as she has had 3 episodes occurring  about a month apart with negative infectiou sworkups.  Refer to Lucilla Lame for further evaluation,       Relevant Medications   insulin aspart protamine - aspart (NOVOLOG 70/30 MIX) (70-30) 100 UNIT/ML FlexPen    Other Visit Diagnoses    Diarrhea in adult patient    -  Primary    Relevant Orders    Ambulatory referral to Gastroenterology       I have discontinued Ms. Mannes insulin aspart protamine - aspart. I have also changed her insulin aspart protamine - aspart. Additionally, I am having her maintain her aspirin, fluticasone, INS SYRINGE/NEEDLE 1CC/28G, albuterol, TRUERESULT BLOOD GLUCOSE, rosuvastatin, Lactulose, losartan, B-D UF III MINI PEN NEEDLES, traMADol, alendronate, TRUEPLUS LANCETS 30G, BRILINTA, furosemide, metoprolol tartrate, isosorbide mononitrate, pregabalin, PARoxetine, ALPRAZolam, meloxicam, Vitamin D (Ergocalciferol), ciprofloxacin, metroNIDAZOLE, and glucose blood.  Meds ordered this encounter  Medications  . DISCONTD: insulin aspart protamine - aspart (NOVOLOG 70/30 MIX) (70-30) 100 UNIT/ML FlexPen    Sig: 60 units before breakfast,  60 units before dinner.  NEEDS 15 PENS PER MONTH    Dispense:  30 mL    Refill:   11  . insulin aspart protamine - aspart (NOVOLOG 70/30 MIX) (70-30) 100 UNIT/ML FlexPen    Sig: 60 units before breakfast,  60 units before dinner.  NEEDS 15 PENS PER MONTH    Dispense:  45 mL    Refill:  11    Medications Discontinued During This Encounter  Medication Reason  . Insulin Aspart Prot & Aspart (NOVOLOG 70/30 MIX) (70-30) 100 UNIT/ML Pen Reorder  . insulin aspart protamine - aspart (NOVOLOG 70/30 MIX) (70-30) 100 UNIT/ML FlexPen     Follow-up: Return in about 2 months (around 01/07/2015) for follow up diabetes.   [name], MD

## 2014-11-07 NOTE — Assessment & Plan Note (Signed)
Improving,  Will increase the evening dose slightly to 60 units  Continue 44 units in the am

## 2014-11-14 NOTE — Patient Outreach (Signed)
[facility]) Care Management  11/14/2014  Pamela Collier Sep 30, 1940 [id number]   Patient phoned nurse line, request sent to Janci Minor, RN to outreach to patient.  Pamela Collier. [place], [place] Management [place] Assistant Phone: [phone] Fax: [phone]

## 2014-11-15 ENCOUNTER — Encounter: Payer: Self-pay | Admitting: Gastroenterology

## 2014-11-15 ENCOUNTER — Encounter: Payer: Medicare Other | Attending: Internal Medicine | Admitting: Dietician

## 2014-11-15 ENCOUNTER — Other Ambulatory Visit: Payer: Self-pay | Admitting: *Deleted

## 2014-11-15 VITALS — BP 128/66 | Ht 66.5 in | Wt 193.8 lb

## 2014-11-15 DIAGNOSIS — E1129 Type 2 diabetes mellitus with other diabetic kidney complication: Secondary | ICD-10-CM | POA: Diagnosis not present

## 2014-11-15 DIAGNOSIS — E119 Type 2 diabetes mellitus without complications: Secondary | ICD-10-CM

## 2014-11-15 NOTE — Patient Instructions (Signed)
   Try Special K Nourish cereal, Kashi [employer], Cheerios Ancient Grains, or Wheaties or Bran flakes for high fiber and low sugar.   Can have a Risk manager sandwich for breakfast   Ask your doctor about a Probiotic supplement such as Culturelle or Florastor to help your stomach heal and build up healthy bacteria.  When you eat cereal, eat a small bowl and add a few nuts or a boiled egg.

## 2014-11-15 NOTE — Patient Outreach (Signed)
RNCM made a follow up phone call to pt related to pt's phone call to [place] Nurse line related to a tick bite. RNCM left a HIPPA compliant message letting her know I was following up and to call RNCM back if needs continued.   Plan: RNCM to see pt this Friday.   Rutherford Limerick RN, BSN  [facility] Care Management [phone])

## 2014-11-15 NOTE — Progress Notes (Signed)
Instructed pt on basic meal planning using plate method and food lists.  Discussed her food diary and positive changes, including adding nuts or boiled egg to breakfast when eating cereal; also discussed healthy types of cereal. Illustrated examples of balanced meals using food models.

## 2014-11-17 ENCOUNTER — Other Ambulatory Visit: Payer: Self-pay | Admitting: *Deleted

## 2014-11-17 ENCOUNTER — Encounter: Payer: Self-pay | Admitting: Gastroenterology

## 2014-11-17 DIAGNOSIS — E1169 Type 2 diabetes mellitus with other specified complication: Secondary | ICD-10-CM

## 2014-11-17 NOTE — Patient Outreach (Signed)
[place] [facility]) Care Management   11/17/2014  Pamela Collier April 13, 1941 [id number]  Pamela Collier is an 74 y.o. female  Subjective:"I haven't been keeping up with my weight like I should because my scale is old and I can't afford a new one ." " I get really short of breath sometimes, but I want to exercise."   Objective: Blood pressure 150/78, pulse 68, SpO2 94 % on room air. Review of Systems  Constitutional: Negative.   Respiratory: Positive for shortness of breath.     Physical Exam  Constitutional: She is oriented to person, place, and time. She appears well-developed and well-nourished.  Cardiovascular: Normal rate, regular rhythm and normal heart sounds.   Pulses:      Dorsalis pedis pulses are 2+ on the right side, and 2+ on the left side.  Pt states she has soreness to her left upper chest mainly when lying down and wonders if it could be the newly placed stent. This pain is described as occurring occasionally and not at present.    Respiratory: Effort normal and breath sounds normal.  GI: Soft. Normal appearance and bowel sounds are normal.  Musculoskeletal: Normal range of motion.  Neurological: She is alert and oriented to person, place, and time. Coordination normal.  Skin: Skin is warm and dry.     Psychiatric: She has a normal mood and affect. Her speech is normal and behavior is normal. Judgment and thought content normal. Cognition and memory are normal.    Current Medications:  Medications reviewed please see encounter for currently taking meds. Current Outpatient Prescriptions  Medication Sig Dispense Refill  . albuterol (PROVENTIL) (2.5 MG/3ML) 0.083% nebulizer solution Take 3 mLs (2.5 mg total) by nebulization every 6 (six) hours as needed for wheezing or shortness of breath. 150 mL 1  . alendronate (FOSAMAX) 70 MG tablet Take with a full glass of water on an empty stomach every Sunday  Take with a full glass of water and take no other meds or  food for   30 minutes . 4 tablet 11  . ALPRAZolam (XANAX) 0.5 MG tablet Take 1 tablet (0.5 mg total) by mouth daily as needed for sleep or anxiety. 30 tablet 3  . aspirin 81 MG EC tablet Take 1 tablet (81 mg total) by mouth daily. Swallow whole. 30 tablet 12  . B-D UF III MINI PEN NEEDLES 31G X 5 MM MISC USE AS DIRECTED WITH NOVOLOG FLEXPEN 100 each 6  . Blood Glucose Monitoring Suppl (TRUERESULT BLOOD GLUCOSE) W/DEVICE KIT 1 kit by Does not apply route 3 (three) times daily. 1 each 0  . BRILINTA 90 MG TABS tablet Take 1 tablet by mouth 2 (two) times daily.    . COMBIVENT RESPIMAT 20-100 MCG/ACT AERS respimat Inhale 1 puff into the lungs 3 (three) times daily as needed.    . fluticasone (FLONASE) 50 MCG/ACT nasal spray Place 2 sprays into the nose daily. 16 g 6  . furosemide (LASIX) 20 MG tablet Take 1 tablet (20 mg total) by mouth daily. 90 tablet 1  . glucose blood (TRUETEST TEST) test strip CHECK BLOOD GLUCOSE THREE TIMES DAILY Dx E11.29 100 each 11  . INS SYRINGE/NEEDLE 1CC/28G 28G X 1/2" 1 ML MISC 1 each by Does not apply route as needed. To test glucose levels daily.    . insulin aspart protamine - aspart (NOVOLOG 70/30 MIX) (70-30) 100 UNIT/ML FlexPen 60 units before breakfast,  60 units before dinner.  NEEDS 15 PENS  PER MONTH 45 mL 11  . isosorbide mononitrate (IMDUR) 30 MG 24 hr tablet Take 1 tablet (30 mg total) by mouth daily. 90 tablet 1  . Lactulose 20 GM/30ML SOLN 30 ml every 4 hours until constipation is relieved 236 mL 3  . losartan (COZAAR) 100 MG tablet TAKE ONE TABLET BY MOUTH ONCE DAILY 30 tablet 5  . meloxicam (MOBIC) 15 MG tablet Take 1 tablet (15 mg total) by mouth daily. 30 tablet 5  . metoprolol tartrate (LOPRESSOR) 25 MG tablet Take 0.5 tablets (12.5 mg total) by mouth 2 (two) times daily. 180 tablet 1  . PARoxetine (PAXIL) 10 MG tablet Take 1 tablet (10 mg total) by mouth daily. 90 tablet 1  . pregabalin (LYRICA) 100 MG capsule Take 1 capsule (100 mg total) by mouth 3  (three) times daily. 270 capsule 3  . rosuvastatin (CRESTOR) 20 MG tablet Take 1 tablet (20 mg total) by mouth daily. 30 tablet 11  . traMADol (ULTRAM) 50 MG tablet Take 1 tablet (50 mg total) by mouth every 6 (six) hours as needed for moderate pain. 120 tablet 3  . TRUEPLUS LANCETS 30G MISC USE TO CHECK GLUCOSE THREE TIMES DAILY 400 each 0  . cloNIDine (CATAPRES) 0.1 MG tablet     . nitrofurantoin, macrocrystal-monohydrate, (MACROBID) 100 MG capsule     . sodium polystyrene (KAYEXALATE) powder     . Vitamin D, Ergocalciferol, (DRISDOL) 50000 UNITS CAPS capsule Take 50,000 Units by mouth every 7 (seven) days.     No current facility-administered medications for this visit.   Facility-Administered Medications Ordered in Other Visits  Medication Dose Route Frequency Provider Last Rate Last Dose  . albuterol (PROVENTIL) (5 MG/ML) 0.5% nebulizer solution 2.5 mg  2.5 mg Nebulization Once [name], MD        Functional Status:   In your present state of health, do you have any difficulty performing the following activities: 10/20/2014  Hearing? N  Vision? N  Difficulty concentrating or making decisions? N  Walking or climbing stairs? Y  Dressing or bathing? N  Doing errands, shopping? N  Preparing Food and eating ? N  Using the Toilet? N  In the past six months, have you accidently leaked urine? N  Do you have problems with loss of bowel control? N  Managing your Medications? N  Managing your Finances? Y  Housekeeping or managing your Housekeeping? Y    Fall/Depression Screening:    PHQ 2/9 Scores 10/30/2014 10/30/2014 10/20/2014 07/23/2014 11/22/2013 08/03/2013 04/03/2012  PHQ - 2 Score 0 0 _0 0 2  PHQ- 9 Score - - 5 - 3 - 10  Exception Documentation - - - Other- indicate reason in comment box - - -    Assessment: Co-visit with Chrystal Land SW. Chrystal and pt discussed financial and emotional strains pt was experiencing.  Pt alert and oriented x3. Lungs clear, heart sounds  nml. Abd soft nontender w +BS throughout. Pt states she had another bout of diarrhea this month in which she had to be placed on antibiotics which she completed. Pt is scheduled for a colonoscopy this month. Pt continues to want to increase exercise and get signed up for [place].  DM: Pt scheduled to see the podiatrist related to hammer toe and callus to bottom of foot this month. Pt's sugars were WNL and she was using new diabetic equipment given to her at the Diabetic classes.Pt stated she felt a lot more comfortable about her diabetes since  attending the diabetic teaching classes. Pt said she was very glad she went.  HF: Pt weight was increased from 184lbs on 6/14(in the morning on her scale) to 189lbs on 7/8(in the afternoon with clothes on).  Talked with pt at length about weighing daily. Went over all HF s/s and reinforced the zones which pt has posted on her refrigerator. Talked with pt about obtaining new scale and to call MD if SOB becomes worsened.  COPD: Pt is currently taking all meds as prescribed, and does not have any wheezing or rhonchi. Does not use home 02 at present.   Plan: RNCM will discuss with SW about obtaining a new scale for pt. RNCM will obtain number for [facility] and give pt the number. RNCM will see pt for f/u in one month.

## 2014-11-18 ENCOUNTER — Encounter: Payer: Self-pay | Admitting: *Deleted

## 2014-11-18 ENCOUNTER — Other Ambulatory Visit: Payer: Self-pay | Admitting: Internal Medicine

## 2014-11-18 NOTE — Patient Outreach (Addendum)
[place] [facility]) Care Management  [facility] Social Work  11/18/2014  Pamela Collier August 07, 1940 [id number]  Subjective:    Objective:   Current Medications:  Current Outpatient Prescriptions  Medication Sig Dispense Refill  . albuterol (PROVENTIL) (2.5 MG/3ML) 0.083% nebulizer solution Take 3 mLs (2.5 mg total) by nebulization every 6 (six) hours as needed for wheezing or shortness of breath. 150 mL 1  . alendronate (FOSAMAX) 70 MG tablet Take with a full glass of water on an empty stomach every Sunday  Take with a full glass of water and take no other meds or food for   30 minutes . 4 tablet 11  . ALPRAZolam (XANAX) 0.5 MG tablet Take 1 tablet (0.5 mg total) by mouth daily as needed for sleep or anxiety. 30 tablet 3  . aspirin 81 MG EC tablet Take 1 tablet (81 mg total) by mouth daily. Swallow whole. 30 tablet 12  . B-D UF III MINI PEN NEEDLES 31G X 5 MM MISC USE AS DIRECTED WITH NOVOLOG FLEXPEN 100 each 6  . Blood Glucose Monitoring Suppl (TRUERESULT BLOOD GLUCOSE) W/DEVICE KIT 1 kit by Does not apply route 3 (three) times daily. 1 each 0  . BRILINTA 90 MG TABS tablet Take 1 tablet by mouth 2 (two) times daily.    . cloNIDine (CATAPRES) 0.1 MG tablet     . COMBIVENT RESPIMAT 20-100 MCG/ACT AERS respimat Inhale 1 puff into the lungs 3 (three) times daily as needed.    . fluticasone (FLONASE) 50 MCG/ACT nasal spray Place 2 sprays into the nose daily. 16 g 6  . furosemide (LASIX) 20 MG tablet Take 1 tablet (20 mg total) by mouth daily. 90 tablet 1  . glucose blood (TRUETEST TEST) test strip CHECK BLOOD GLUCOSE THREE TIMES DAILY Dx E11.29 100 each 11  . INS SYRINGE/NEEDLE 1CC/28G 28G X 1/2" 1 ML MISC 1 each by Does not apply route as needed. To test glucose levels daily.    . insulin aspart protamine - aspart (NOVOLOG 70/30 MIX) (70-30) 100 UNIT/ML FlexPen 60 units before breakfast,  60 units before dinner.  NEEDS 15 PENS PER MONTH 45 mL 11  . isosorbide mononitrate (IMDUR) 30 MG  24 hr tablet Take 1 tablet (30 mg total) by mouth daily. 90 tablet 1  . Lactulose 20 GM/30ML SOLN 30 ml every 4 hours until constipation is relieved 236 mL 3  . losartan (COZAAR) 100 MG tablet TAKE ONE TABLET BY MOUTH ONCE DAILY 30 tablet 5  . meloxicam (MOBIC) 15 MG tablet Take 1 tablet (15 mg total) by mouth daily. 30 tablet 5  . metoprolol tartrate (LOPRESSOR) 25 MG tablet Take 0.5 tablets (12.5 mg total) by mouth 2 (two) times daily. 180 tablet 1  . nitrofurantoin, macrocrystal-monohydrate, (MACROBID) 100 MG capsule     . PARoxetine (PAXIL) 10 MG tablet Take 1 tablet (10 mg total) by mouth daily. 90 tablet 1  . pregabalin (LYRICA) 100 MG capsule Take 1 capsule (100 mg total) by mouth 3 (three) times daily. 270 capsule 3  . rosuvastatin (CRESTOR) 20 MG tablet Take 1 tablet (20 mg total) by mouth daily. 30 tablet 11  . sodium polystyrene (KAYEXALATE) powder     . traMADol (ULTRAM) 50 MG tablet Take 1 tablet (50 mg total) by mouth every 6 (six) hours as needed for moderate pain. 120 tablet 3  . TRUEPLUS LANCETS 30G MISC USE TO CHECK GLUCOSE THREE TIMES DAILY 400 each 0  . Vitamin D, Ergocalciferol, (DRISDOL)  50000 UNITS CAPS capsule Take 50,000 Units by mouth every 7 (seven) days.     No current facility-administered medications for this visit.   Facility-Administered Medications Ordered in Other Visits  Medication Dose Route Frequency Provider Last Rate Last Dose  . albuterol (PROVENTIL) (5 MG/ML) 0.5% nebulizer solution 2.5 mg  2.5 mg Nebulization Once [name], MD        Functional Status:  In your present state of health, do you have any difficulty performing the following activities: 10/20/2014  Hearing? N  Vision? N  Difficulty concentrating or making decisions? N  Walking or climbing stairs? Y  Dressing or bathing? N  Doing errands, shopping? N  Preparing Food and eating ? N  Using the Toilet? N  In the past six months, have you accidently leaked urine? N  Do you have  problems with loss of bowel control? N  Managing your Medications? N  Managing your Finances? Y  Housekeeping or managing your Housekeeping? Y    Fall/Depression Screening:  PHQ 2/9 Scores 10/30/2014 10/30/2014 10/20/2014 07/23/2014 11/22/2013 08/03/2013 04/03/2012  PHQ - 2 Score 0 0 _0 0 2  PHQ- 9 Score - - 5 - 3 - 10  Exception Documentation - - - Other- indicate reason in comment box - - -    Assessment:  Co-visit with RNCM to assess patient's financial concerns and community resource needs.  Patient discussed having difficulties affording her medications.  Per patient, her spouse takes care of al of the majority of the household bills and she is responsible for there personal expenses including her medications. Patient discussed medication affordability, she struggles mainly with prescriptions for her Insulin, Crestor, Flonase and Combivent.  This Education officer, museum will refer patient to pharmacist for medication review and eligibility for prescription assistance programs.  Per RNCM, patient needs to weigh daily and does not have a scale.  Financial assistance form completed to assess eligibility for a scale.    Patient discussed difficulty maintaining  her household and discussed plan to higher a housekeeper to come in approximately once a month.  No additional referrals needed in this area.  Patient discussed having a positive but limited family support system as the 2 of her family live in [place].   Per patient, her son and daughter in law are very helpful.  However strained relationship described in regards to her spouse.  Patient allowed to vent, emotional support provided.  Positive coping emphasized.  Advanced Directive provided.  Patient will review, document to be completed during the next visit.  Note routed to patient's provider office.   Plan:  Advanced Directive to be completed during next visit. Continued emotional support to continue to be provided. Patient to be  referred to Pharmacist for medication review Involvement letter to be sent to patient's doctor.    [name] [facility] Care Management [phone]

## 2014-11-20 ENCOUNTER — Ambulatory Visit: Payer: Medicare Other | Admitting: Gastroenterology

## 2014-11-20 NOTE — Patient Outreach (Signed)
[place] [facility]) Care Management  11/20/2014  ANALICIA BARTOLUCCI 23-Sep-1940 [id number]   Request from Elliot Gurney, LCSW to assign Pharmacy,  Harlow Asa, PharmD assigned.  [name]. [place], [place] Management [place] Assistant Phone: [phone] Fax: [phone]

## 2014-11-21 ENCOUNTER — Other Ambulatory Visit: Payer: Self-pay | Admitting: *Deleted

## 2014-11-21 DIAGNOSIS — J9601 Acute respiratory failure with hypoxia: Secondary | ICD-10-CM | POA: Insufficient documentation

## 2014-11-21 DIAGNOSIS — J9600 Acute respiratory failure, unspecified whether with hypoxia or hypercapnia: Secondary | ICD-10-CM

## 2014-11-21 HISTORY — DX: Acute respiratory failure, unspecified whether with hypoxia or hypercapnia: J96.00

## 2014-11-21 NOTE — Patient Outreach (Signed)
Per pt request Med Alert called and given pt information to set up system in her home. Volunteer Jan relayed she would call pt.   Plan: Will f/u with pt on Friday to see if she received a call.   Rutherford Limerick RN, BSN  [facility] Care Management [phone])

## 2014-11-22 ENCOUNTER — Ambulatory Visit (INDEPENDENT_AMBULATORY_CARE_PROVIDER_SITE_OTHER): Payer: Medicare Other | Admitting: Gastroenterology

## 2014-11-22 ENCOUNTER — Encounter: Payer: Self-pay | Admitting: Gastroenterology

## 2014-11-22 ENCOUNTER — Other Ambulatory Visit: Payer: Self-pay

## 2014-11-22 ENCOUNTER — Other Ambulatory Visit: Payer: Self-pay | Admitting: Pharmacist

## 2014-11-22 VITALS — BP 152/60 | HR 58 | Temp 98.5°F | Ht 63.0 in | Wt 192.0 lb

## 2014-11-22 DIAGNOSIS — I255 Ischemic cardiomyopathy: Secondary | ICD-10-CM | POA: Diagnosis not present

## 2014-11-22 DIAGNOSIS — R197 Diarrhea, unspecified: Secondary | ICD-10-CM

## 2014-11-22 NOTE — Progress Notes (Signed)
Gastroenterology Consultation  Referring Provider:     [name], MD Primary Care Physician:  [name], MD Primary Gastroenterologist:  Dr. Allen Norris     Reason for Consultation:     Diarrhea        HPI:   Pamela Collier is a 74 y.o. y/o female referred for consultation & management of diarrhea with bloating by Dr. [name], MD.  This patient comes today with a report of diarrhea with bloating that she reports happening approximately once a month. The patient states that in between the diarrhea and abdominal pain with bloating she has normal bowel movements. The patient states that she drinks milk every day with her cereal in the morning and sometimes has cheese and other dairy products. The patient is not able to tell me of any foods or things that she doesn't make her symptoms worse. The patient reports that she had this when she was living in [place]. The patient also reports that she has not had a colonoscopy many years. There is no report of any unexplained weight loss black stools or bloody stools. She also denies any nausea or vomiting. The patient also has coffee in the morning with cream.  Past Medical History  Diagnosis Date  . Diabetes mellitus   . 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 Cardiomyopathy, ischemic Jan 2013    EF 35 to 40% [facility])  . Hepatic steatosis     by CT abd pelvis  . Osteoporosis, post-menopausal   . 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 Hypertension   . Hyperlipidemia   . Sarcoidosis   . CHF (congestive heart failure)   . Renal insufficiency   . 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 [facility] (congestive cardiac failure) 08/17/2013  . Pleural effusion 10/25/2012    10/2012 CT chest >> small to moderate R lung effusion>>  chylothorax, 100% lymphs 10/2013 thoracentesis> cytology negative, WBC 1471, > 90% "small lymphs" 01/2014 CT chest> near complete resolution of pleural effusion, stable lymphadenopathy 08/2014 CT chest [place] ([facility]): small right sided effusion decreased in size, stable mediastinal lymphadenopathy 1.0cm largest, 31m . 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 Acute respiratory failure 11/21/2014  . Diabetic diarrhea 10/03/2014  . DM type 2, uncontrolled, with renal complications 87/10/8086 . Cerebral infarct 08/17/2013  . Arthritis 08/05/2013  . 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 Hyperlipidemia LDL goal <100 02/23/2014    Past Surgical History  Procedure Laterality Date  . Abdominal hysterectomy      at ge 39. secondary to bleeding  . Toe amputation  Sept 2012    Right 2nd toe, [place]  . Ptca  August 2012    Right Posterior tibial artery , [place]  . Coronary angioplasty with stent placement      Prior to Admission medications   Medication Sig Start Date End Date Taking? Authorizing Provider  albuterol (PROVENTIL) (2.5 MG/3ML) 0.083% nebulizer solution Take 3 mLs (2.5  mg total) by nebulization every 6 (six) hours as needed for wheezing or shortness of breath. 08/15/13  Yes [name], MD  alendronate (FOSAMAX) 70 MG tablet Take with a full glass of water on an empty stomach every Sunday  Take with a full glass of water and take no other meds or food for   30 minutes . 07/20/14  Yes [name], MD  ALPRAZolam [name]) 0.5 MG tablet Take 1 tablet (0.5 mg total) by mouth daily as needed for sleep or anxiety. 10/04/14  Yes [name], MD  aspirin 81 MG EC tablet Take 1 tablet (81 mg total) by mouth daily. Swallow whole. 04/18/12  Yes [name],  MD  B-D UF III MINI PEN NEEDLES 31G X 5 MM MISC USE AS DIRECTED WITH NOVOLOG FLEXPEN 07/13/14  Yes [name], MD  Blood Glucose Monitoring Suppl (TRUERESULT BLOOD GLUCOSE) W/DEVICE KIT 1 kit by Does not apply route 3 (three) times daily. 09/27/13  Yes [name], MD  BRILINTA 90 MG TABS tablet Take 1 tablet by mouth 2 (two) times daily. 09/22/14  Yes Historical Provider, MD  cloNIDine (CATAPRES) 0.1 MG tablet  08/16/14  Yes Historical Provider, MD  COMBIVENT RESPIMAT 20-100 MCG/ACT AERS respimat Inhale 1 puff into the lungs 3 (three) times daily as needed. 10/25/14  Yes Historical Provider, MD  fluticasone (FLONASE) 50 MCG/ACT nasal spray Place 2 sprays into the nose daily. 07/01/12  Yes Juanito Doom, MD  furosemide (LASIX) 20 MG tablet Take 1 tablet (20 mg total) by mouth daily. 10/03/14  Yes [name], MD  glucose blood (TRUETEST TEST) test strip CHECK BLOOD GLUCOSE THREE TIMES DAILY Dx E11.29 11/02/14  Yes [name], MD  INS SYRINGE/NEEDLE 1CC/28G 28G X 1/2" 1 ML MISC 1 each by Does not apply route as needed. To test glucose levels daily. 10/01/12  Yes [name], MD  insulin aspart protamine - aspart (NOVOLOG 70/30 MIX) (70-30) 100 UNIT/ML FlexPen 60 units before breakfast,  60 units before dinner.  NEEDS 15 PENS PER MONTH 11/07/14  Yes [name], MD  isosorbide mononitrate (IMDUR) 30 MG 24 hr tablet Take 1 tablet (30 mg total) by mouth daily. 10/03/14  Yes [name], MD  Lactulose 20 GM/30ML SOLN 30 ml every 4 hours until constipation is relieved 04/03/14  Yes [name], MD  losartan (COZAAR) 100 MG tablet TAKE ONE TABLET BY MOUTH ONCE DAILY 07/12/14  Yes [name], MD  meloxicam (MOBIC) 15 MG tablet Take 1 tablet (15 mg total) by mouth daily. 10/27/14  Yes [name], MD  metoprolol tartrate (LOPRESSOR) 25 MG tablet Take 0.5 tablets (12.5 mg total) by mouth 2 (two) times daily. 10/03/14  Yes [name], MD  nitrofurantoin, macrocrystal-monohydrate,  (MACROBID) 100 MG capsule  10/04/14  Yes Historical Provider, MD  PARoxetine (PAXIL) 10 MG tablet Take 1 tablet (10 mg total) by mouth daily. 10/04/14  Yes [name], MD  pregabalin (LYRICA) 100 MG capsule Take 1 capsule (100 mg total) by mouth 3 (three) times daily. 10/04/14  Yes [name], MD  rosuvastatin (CRESTOR) 20 MG tablet Take 1 tablet (20 mg total) by mouth daily. 03/27/14  Yes [name], MD  sodium polystyrene (KAYEXALATE) powder  09/12/14  Yes Historical Provider, MD  traMADol (ULTRAM) 50 MG tablet Take 1 tablet (50 mg total) by mouth every 6 (six) hours as needed for moderate pain. 07/20/14  Yes [name], MD  TRUEPLUS LANCETS 30G MISC USE TO CHECK GLUCOSE THREE TIMES DAILY 07/21/14  Yes [name], MD  Vitamin D, Ergocalciferol, (DRISDOL) 50000 UNITS CAPS capsule Take 50,000 Units by mouth every 7 (seven) days.   Yes Historical Provider, MD    Family History  Problem Relation Age of Onset  . Heart disease Father   . Heart disease Sister   . Heart disease Brother   . Asthma Grandchild   . Breast cancer Maternal Aunt      History  Substance Use Topics  . Smoking status: Never Smoker   . Smokeless tobacco: Never Used  . Alcohol Use: No    Allergies as of 11/22/2014 - Review Complete 11/22/2014  Allergen Reaction Noted  . Gabapentin Itching 07/01/2013  . Sulfa antibiotics Itching 12/12/2011    Review of Systems:    All systems reviewed and negative except where noted in HPI.   Physical Exam:  BP 152/60 mmHg  Pulse 58  Temp(Src) 98.5 F (36.9 C) (Oral)  Ht 5' 3"  (1.6 m)  Wt 192 lb (87.091 kg)  BMI 34.02 kg/m2 No LMP recorded. Patient has had a hysterectomy. Psych:  Alert and cooperative. Normal mood and affect. General:   Alert,  Well-developed, well-nourished, pleasant and cooperative in NAD Head:  Normocephalic and atraumatic. Eyes:  Sclera clear, no icterus.   Conjunctiva pink. Ears:  Normal auditory acuity. Nose:  No deformity, discharge,  or lesions. Mouth:  No deformity or lesions,oropharynx pink & moist. Neck:  Supple; no masses or thyromegaly. Lungs:  Respirations even and unlabored.  Clear throughout to auscultation.   No wheezes, crackles, or rhonchi. No acute distress. Heart:  Regular rate and rhythm; no murmurs, clicks, rubs, or gallops. Abdomen:  Normal bowel sounds.  No bruits.  Soft, non-tender and non-distended without masses, hepatosplenomegaly or hernias noted.  No guarding or rebound tenderness.  Negative Carnett sign.   Rectal:  Deferred.  Msk:  Symmetrical without gross deformities.  Good, equal movement & strength bilaterally. Pulses:  Normal pulses noted. Extremities:  No clubbing or edema.  No cyanosis. Neurologic:  Alert and oriented x3;  grossly normal neurologically. Skin:  Intact without significant lesions or rashes.  No jaundice. Lymph Nodes:  No significant cervical adenopathy. Psych:  Alert and cooperative. Normal mood and affect.  Imaging Studies: Ct Abdomen Pelvis W Contrast  11/02/2014   CLINICAL DATA:  Acute onset of right-sided abdominal pain and chest pain. Shortness of breath. Diarrhea. Initial encounter.  EXAM: CT ABDOMEN AND PELVIS WITH CONTRAST  TECHNIQUE: Multidetector CT imaging of the abdomen and pelvis was performed using the standard protocol following bolus administration of intravenous contrast.  CONTRAST:  46m OMNIPAQUE IOHEXOL 300 MG/ML  SOLN  COMPARISON:  CT of the abdomen and pelvis from 03/28/2014, and lumbar spine MRI performed 07/12/2014  FINDINGS: Mild bibasilar atelectasis is noted. Diffuse coronary artery calcifications are seen. Scattered calcified nodes are noted at the azygoesophageal recess and in a bilateral peribronchial distribution.  Mild wall thickening along the distal esophagus is similar in appearance to the prior study.  Prominent nodes are seen about the mid to distal esophagus, measuring up to 1.6 cm in short axis. Scattered nodes are noted about the celiac trunk,  measuring up to 1.2 cm in short axis. Paracaval nodes measure up to 1.0 cm in short axis.  The liver and spleen are unremarkable in appearance. The gallbladder is within normal limits. The pancreas and adrenal glands are unremarkable.  Nonspecific perinephric stranding is noted bilaterally. The kidneys are otherwise unremarkable. There is no evidence of hydronephrosis. No renal or ureteral stones are seen.  No free fluid is identified. The small bowel is unremarkable in appearance. The stomach is within normal limits. No acute vascular abnormalities are seen. Scattered calcification is noted along the abdominal aorta and its branches, including along the course of the superior mesenteric artery and at the origins of the renal arteries bilaterally.  The appendix is diminutive and unremarkable in appearance. There is no evidence of appendicitis. Minimal diverticulosis is noted along the sigmoid colon, without evidence of diverticulitis.  The bladder is mildly distended and grossly remarkable. The patient is status post hysterectomy. No suspicious adnexal masses are seen. The right ovary is unremarkable in appearance. No inguinal lymphadenopathy is seen.  No acute osseous abnormalities are identified.  IMPRESSION: 1. No acute abnormality seen to explain the patient's symptoms. 2. Mild wall thickening along the distal esophagus is similar in appearance to prior studies, with enlarged surrounding nodes again seen. These are of uncertain significance. Scattered nodes about the celiac trunk and mildly prominent paracaval nodes. This may reflect sequelae of prior granulomatous disease, given relative stability from 2013. 3. Diffuse coronary artery calcifications seen. 4. Mild bibasilar atelectasis noted. 5. Scattered calcification along the abdominal aorta and its branches, including along the course of the superior mesenteric artery and at the origins of the renal arteries bilaterally. 6. Minimal diverticulosis along the  sigmoid colon, without evidence of diverticulitis.   Electronically Signed   By: Garald Balding M.D.   On: 11/02/2014 05:09    Assessment and Plan:   GLORIE DOWLEN is a 74 y.o. y/o female who comes today with a report of diarrhea with lower abdominal bloating. The patient has been told to try and avoid milk products for one week to see if her bloating and diarrhea get any better. The diarrhea symptoms are intermittent and avoiding milk may not show any difference in the diarrhea at this time. The patient will be set up for colonoscopy to evaluate her for other possible causes of her diarrhea. Patient also had a CT scan that showed mild wall thickening along the distal esophagus which is similar to a previous imaging which was thought to represent the sequela of granulomatous disease.I have discussed risks & benefits which include, but are not limited to, bleeding, infection, perforation & drug reaction.  The patient agrees with this plan & written consent will be obtained.

## 2014-11-22 NOTE — Patient Outreach (Signed)
RAHI UMHOLTZ is a 74 y.o. female referred to pharmacy for assistance with affording her medications. Called and spoke with Mrs. Mahlke who reports that she is having particular trouble with affording her Brilinta (costs her $38/month) Novolog 70/30 (costs her $40/month), and Lyrica. Reports that she cannot remember what other medications are causing her cost issues right now. Reports that she has medical bills that are pilling up that she can't pay.  Verified with Mrs. Frankenstein that she has traditional medicare with prescription coverage through [facility] through her husband's past employer. Let Mrs. Lintz know that I will check out this formulary online to look for cost-saving alternatives for her. Also spoke with the patient about other assistance options, such as the Extra Help and patient assistance programs. Mrs. Burdett states that she does not have all of the financial information that she needs to discuss this right now as her husband is not home. Patient reports that she will call me back once he is home. If I have not heard back from Mrs. Speiser by 11/24/14, will follow up with her again at that time.  Harlow Asa, PharmD Clinical Pharmacist [facility] [phone]

## 2014-11-24 ENCOUNTER — Other Ambulatory Visit: Payer: Self-pay | Admitting: Pharmacist

## 2014-11-24 ENCOUNTER — Telehealth: Payer: Self-pay | Admitting: Internal Medicine

## 2014-11-24 NOTE — Telephone Encounter (Signed)
Pt came in to get a refill on lyrica rx. Pt filled out triage form. Form given to Nitchia/msn

## 2014-11-24 NOTE — Patient Outreach (Signed)
Received a voicemail from yesterday, 11/23/14, from [name] letting me know that she would not be available for our follow up phone call today. Will try again to reach her next week.  Pamela Collier, PharmD Clinical Pharmacist [facility] [phone]

## 2014-11-24 NOTE — Telephone Encounter (Signed)
Spoke with pharmacy, last Rx written 5.25.16 w/3 additional refills.  Left message on pts Vm rx at pharmacy.

## 2014-11-27 ENCOUNTER — Other Ambulatory Visit: Payer: Self-pay | Admitting: Pharmacist

## 2014-11-27 NOTE — Patient Outreach (Signed)
Called Ms. Gentry back to let her know that the [facility] on [address] has the medication in stock and will transfer the prescription. Patient states that she knows where this pharmacy is located and that it is not too far. Reports that she will head out soon to pick up the medication. Will follow up with Ms. Plachy later today to verify that she successfully got her medication.  Harlow Asa, PharmD Clinical Pharmacist [facility] [phone]

## 2014-11-27 NOTE — Patient Outreach (Signed)
Called another local [facility], located at [address] in [place], [place]. This pharmacy does have a box of the 70/30 flexpens in stock and agrees to transfer and partial the patient's prescription this morning. Let the pharmacist know that the patient needs 3 boxes for a 30 day supply and is expecting a $40 copay. Also let the pharmacist know that the patient will be on her way.  Harlow Asa, PharmD Clinical Pharmacist [facility] [phone]

## 2014-11-27 NOTE — Patient Outreach (Signed)
Called to follow up with Mrs. Salay about scheduling an appointment for [place] to discuss medication assistance at a time when Mr. Meikle will be home. Scheduled a visit for Wednesday, 7/20, at 11 AM at the patient's home.  While on the phone, patient reports that she is currently out of Novolog 70/30 and that she does not have a dose for this morning. Reports that per her [place], this is to be coming in the order, but not until this afternoon. Patient is concerned about not having her morning dose. States that she will just have to take it this afternoon. Discuss with Mrs. Rovito the danger of taking this dose so late in the day, overlapping with her evening dose. Will call pharmacies in the area to try to find a pharmacy that has this medication in stock for her.  Harlow Asa, PharmD Clinical Pharmacist [facility] [phone]

## 2014-11-27 NOTE — Patient Outreach (Signed)
Called Pamela Collier and verified that she was able to get her Novolog 70/30. Reports that she was able to successfully pick up and take her medication from [facility] on [address] this morning.  Reminded patient that I will see her on 11/29/14 at [place], PharmD Clinical Pharmacist [facility] [phone]

## 2014-11-29 ENCOUNTER — Other Ambulatory Visit: Payer: Self-pay | Admitting: Pharmacist

## 2014-11-29 NOTE — Patient Outreach (Signed)
[facility]) Care Management  [place]   11/29/2014  DANE BLOCH 02-Nov-1940 [id number]  Subjective:Pamela Collier is a 74 y.o. female referred to pharmacy for medication assistance. Met with Ms. Medine today in her home.  Ms. Sponsel shows me her Medical Expense Report run by [place] on 11/26/14 for dates 05/12/13 through 11/26/14. Note 4 medications that the patient is having particular difficulty affording; these are Novolog 70/30, Brilinta, Combivent and Lyrica. At this time, the patient has not been taking her Lyrica because of the cost. Suzie Portela has told her that a 90 day supply would cost $129. Called [facility] while with the patient, technician Yasmin states that a 30 day supply is $38.70 and that they already have this ready for the patient. While, on the phone with the pharmacy, also request refills of meloxicam, metoprolol and losartan, which the patient shows she is currently almost out of. Patient refills her weekly pillbox during the visit.  Discussed with Mrs Campillo some alternatives to her more expensive medications. Note that patient is allergic to gabapentin. Regarding the Combivent, patient states that she had a albuterol inhaler in the past, but that her pulmonologist told her that she needs to use this instead, as it is more effective for her. Discussed with patient the albuterol/ipratropium nebulizer solution, however, patient is not always home when she needs this inhaler, making a nebulizer solution formulation inadequate.  While with the patient, call her prescription insurance, [employer], to see what tier these more expensive medications are through her insurance and to see if she has a preferred pharmacy option, such as mail order. Speak with Quillian Quince and [employer] at [employer] who quote the following tiers and copays:   Lyrica = Tier 2; copay $80 for 90 day supply; $25.80 per 30 days  Brilinta = Tier 2, $80 for 90 days; $25.80  for 30 days  Novolog 70/30 = Tier 2, $26.66 for 30 days supply, $80 for 90 days supply  Combivent = Tier 2, $80 for 90 days; $26.66 for 30 days   Confirm with Quillian Quince and [place] that, for tier 2 medications, it is cheaper to the patient to get these as thirty day supplies, rather than 90. However, they state that for her tier 1 medications, it is cheaper for her to receive these as 90 day supplies. Per Quillian Quince and [place], they will not bill medications through Part B, so the patient will need to continue to receive these medications from [place].  Mrs. Fuelling asks about automatic refill, Candice states that autorefill is only available for the 90 day supplies, not 30 day supply.  Write out an information sheet for Mrs. Wendi Snipes to:  1) remind her to pick up metoprolol, losartan, meloxicam and Lyrica from [facility] today  2) to provide her with the [facility] Mail Order Phone number, [phone]  3) Lists the 4 medications that she will be getting for 30 days supply and need to call for refills  4) List the supplies/medications that she will continue to need to get through [place].  Also write out a note for the patient's PCP, Dr. Derrel Nip, to provide her with the physician fax/phone line for [facility] Mail Order,  [phone] and to let them know which medications need to be called in for 30 days, which for 90 days and that the test strips, lancets and albuterol nebulizer solution need to still go to [facility]. Mrs. Pfahler states that she will be by her PCP's office  tomorrow and will drop off this note and request they send the new prescriptions for mail order at that time.  Provide Mr and Mrs Paolillo with information about the Extra Help Application for medication cost assistance. Mr. Suman states that he does not believe that they will qualify financially. Mr and Mrs. Spillman do not wish to have assistance in completing the application at this time.   Objective:   Current  Medications: Current Outpatient Prescriptions  Medication Sig Dispense Refill  . albuterol (PROVENTIL) (2.5 MG/3ML) 0.083% nebulizer solution Take 3 mLs (2.5 mg total) by nebulization every 6 (six) hours as needed for wheezing or shortness of breath. 150 mL 1  . alendronate (FOSAMAX) 70 MG tablet Take with a full glass of water on an empty stomach every Sunday  Take with a full glass of water and take no other meds or food for   30 minutes . 4 tablet 11  . ALPRAZolam (XANAX) 0.5 MG tablet Take 1 tablet (0.5 mg total) by mouth daily as needed for sleep or anxiety. 30 tablet 3  . aspirin 81 MG EC tablet Take 1 tablet (81 mg total) by mouth daily. Swallow whole. 30 tablet 12  . B-D UF III MINI PEN NEEDLES 31G X 5 MM MISC USE AS DIRECTED WITH NOVOLOG FLEXPEN 100 each 6  . Blood Glucose Monitoring Suppl (TRUERESULT BLOOD GLUCOSE) W/DEVICE KIT 1 kit by Does not apply route 3 (three) times daily. 1 each 0  . BRILINTA 90 MG TABS tablet Take 1 tablet by mouth 2 (two) times daily.    . cloNIDine (CATAPRES) 0.1 MG tablet     . COMBIVENT RESPIMAT 20-100 MCG/ACT AERS respimat Inhale 1 puff into the lungs 3 (three) times daily as needed.    . fluticasone (FLONASE) 50 MCG/ACT nasal spray Place 2 sprays into the nose daily. 16 g 6  . furosemide (LASIX) 20 MG tablet Take 1 tablet (20 mg total) by mouth daily. 90 tablet 1  . glucose blood (TRUETEST TEST) test strip CHECK BLOOD GLUCOSE THREE TIMES DAILY Dx E11.29 100 each 11  . INS SYRINGE/NEEDLE 1CC/28G 28G X 1/2" 1 ML MISC 1 each by Does not apply route as needed. To test glucose levels daily.    . insulin aspart protamine - aspart (NOVOLOG 70/30 MIX) (70-30) 100 UNIT/ML FlexPen 60 units before breakfast,  60 units before dinner.  NEEDS 15 PENS PER MONTH 45 mL 11  . isosorbide mononitrate (IMDUR) 30 MG 24 hr tablet Take 1 tablet (30 mg total) by mouth daily. 90 tablet 1  . Lactulose 20 GM/30ML SOLN 30 ml every 4 hours until constipation is relieved 236 mL 3  .  losartan (COZAAR) 100 MG tablet TAKE ONE TABLET BY MOUTH ONCE DAILY 30 tablet 5  . meloxicam (MOBIC) 15 MG tablet Take 1 tablet (15 mg total) by mouth daily. 30 tablet 5  . metoprolol tartrate (LOPRESSOR) 25 MG tablet Take 0.5 tablets (12.5 mg total) by mouth 2 (two) times daily. 180 tablet 1  . nitrofurantoin, macrocrystal-monohydrate, (MACROBID) 100 MG capsule     . PARoxetine (PAXIL) 10 MG tablet Take 1 tablet (10 mg total) by mouth daily. 90 tablet 1  . pregabalin (LYRICA) 100 MG capsule Take 1 capsule (100 mg total) by mouth 3 (three) times daily. 270 capsule 3  . rosuvastatin (CRESTOR) 20 MG tablet Take 1 tablet (20 mg total) by mouth daily. 30 tablet 11  . sodium polystyrene (KAYEXALATE) powder     . traMADol (  ULTRAM) 50 MG tablet Take 1 tablet (50 mg total) by mouth every 6 (six) hours as needed for moderate pain. 120 tablet 3  . TRUEPLUS LANCETS 30G MISC USE TO CHECK GLUCOSE THREE TIMES DAILY 400 each 0  . Vitamin D, Ergocalciferol, (DRISDOL) 50000 UNITS CAPS capsule Take 50,000 Units by mouth every 7 (seven) days.     No current facility-administered medications for this visit.   Facility-Administered Medications Ordered in Other Visits  Medication Dose Route Frequency Provider Last Rate Last Dose  . albuterol (PROVENTIL) (5 MG/ML) 0.5% nebulizer solution 2.5 mg  2.5 mg Nebulization Once [name], MD        Functional Status: In your present state of health, do you have any difficulty performing the following activities: 10/20/2014  Hearing? N  Vision? N  Difficulty concentrating or making decisions? N  Walking or climbing stairs? Y  Dressing or bathing? N  Doing errands, shopping? N  Preparing Food and eating ? N  Using the Toilet? N  In the past six months, have you accidently leaked urine? N  Do you have problems with loss of bowel control? N  Managing your Medications? N  Managing your Finances? Y  Housekeeping or managing your Housekeeping? Y    Fall/Depression  Screening: PHQ 2/9 Scores 10/30/2014 10/30/2014 10/20/2014 07/23/2014 11/22/2013 08/03/2013 04/03/2012  PHQ - 2 Score 0 0 3 1 2  0 2  PHQ- 9 Score - - 5 - 3 - 10  Exception Documentation - - - Other- indicate reason in comment box - - -    Assessment:  Patient is having difficulty affording her medications. Using [facility] Mail Order is a cost savings opportunity for the patient.   Patient's most recent eGFR was 37 mL/min on 11/02/14 during an ED admission.   Plan:   Patient to pick up her refills from [facility] today.  Patient to request her prescriptions to be called into [facility] Mail Order for 30 day and 90 day supplies tomorrow as described in the note that I wrote for her.  I will follow up with Mrs. Awwad on 12/06/14 to make sure that she is able to mail order setup.  I will contact Mrs. Shepard's PCP to request that she repeat a basic metabolic panel for the patient. If her renal function is still reduced, will discuss medication dose adjustments as appropriate.   Harlow Asa, PharmD Clinical Pharmacist [place] [facility] [phone]

## 2014-11-30 ENCOUNTER — Other Ambulatory Visit: Payer: Self-pay | Admitting: Internal Medicine

## 2014-11-30 ENCOUNTER — Other Ambulatory Visit: Payer: Self-pay | Admitting: Pharmacist

## 2014-11-30 DIAGNOSIS — E1042 Type 1 diabetes mellitus with diabetic polyneuropathy: Secondary | ICD-10-CM | POA: Diagnosis not present

## 2014-11-30 DIAGNOSIS — I255 Ischemic cardiomyopathy: Secondary | ICD-10-CM

## 2014-11-30 DIAGNOSIS — B351 Tinea unguium: Secondary | ICD-10-CM | POA: Diagnosis not present

## 2014-11-30 DIAGNOSIS — Z89422 Acquired absence of other left toe(s): Secondary | ICD-10-CM | POA: Diagnosis not present

## 2014-11-30 DIAGNOSIS — D2370 Other benign neoplasm of skin of unspecified lower limb, including hip: Secondary | ICD-10-CM | POA: Diagnosis not present

## 2014-11-30 NOTE — Telephone Encounter (Addendum)
Please advise ok to send the penned meds to [facility] for patient. This is an excessive amount to refill at one time and I called [facility] and some of these meds have recently filled such as the Lyrica

## 2014-11-30 NOTE — Patient Outreach (Signed)
Received a call from [facility] at Dr. Lupita Dawn office requesting clarification about the list that I sent with Mrs. Kelm of the list of medications that she needs as 30 day and 90 day supplies through [facility] mail order. Provided clarification, including which medications need to be faxed as 30 day supplies (Novolog, Combivent, Lyrica and Brilinta), requesting a 90 day prescription be faxed in for all others including fluticasone as well, as this OTC medication will be covered. Also let Bernestine Amass know that the albuterol nebulizer solution, test strips and lancets still need to be sent through [place] due to Part B billing. Niesha verbalized understanding.  Harlow Asa, PharmD Clinical Pharmacist [place] [facility] [phone]

## 2014-12-01 ENCOUNTER — Encounter: Payer: Self-pay | Admitting: Pharmacist

## 2014-12-01 MED ORDER — INSULIN PEN NEEDLE 31G X 5 MM MISC: Status: DC

## 2014-12-01 MED ORDER — ALBUTEROL SULFATE (2.5 MG/3ML) 0.083% IN NEBU: 2.5000 mg | INHALATION_SOLUTION | Freq: Four times a day (QID) | RESPIRATORY_TRACT | Status: DC | PRN

## 2014-12-01 MED ORDER — ALENDRONATE SODIUM 70 MG PO TABS: ORAL_TABLET | ORAL | Status: DC

## 2014-12-01 MED ORDER — PREGABALIN 100 MG PO CAPS: 100.0000 mg | ORAL_CAPSULE | Freq: Three times a day (TID) | ORAL | Status: DC

## 2014-12-01 MED ORDER — BRILINTA 90 MG PO TABS: 90.0000 mg | ORAL_TABLET | Freq: Two times a day (BID) | ORAL | Status: DC

## 2014-12-01 MED ORDER — TRAMADOL HCL 50 MG PO TABS: 50.0000 mg | ORAL_TABLET | Freq: Four times a day (QID) | ORAL | Status: DC | PRN

## 2014-12-01 MED ORDER — ALPRAZOLAM 0.5 MG PO TABS: 0.5000 mg | ORAL_TABLET | Freq: Every day | ORAL | Status: DC | PRN

## 2014-12-01 MED ORDER — ROSUVASTATIN CALCIUM 20 MG PO TABS: 20.0000 mg | ORAL_TABLET | Freq: Every day | ORAL | Status: DC

## 2014-12-01 MED ORDER — FLUTICASONE PROPIONATE 50 MCG/ACT NA SUSP: 2.0000 | Freq: Every day | NASAL | Status: DC

## 2014-12-01 MED ORDER — ISOSORBIDE MONONITRATE ER 30 MG PO TB24: 30.0000 mg | ORAL_TABLET | Freq: Every day | ORAL | Status: DC

## 2014-12-01 MED ORDER — FUROSEMIDE 20 MG PO TABS: 20.0000 mg | ORAL_TABLET | Freq: Every day | ORAL | Status: DC

## 2014-12-01 MED ORDER — LOSARTAN POTASSIUM 100 MG PO TABS
100.0000 mg | ORAL_TABLET | Freq: Every day | ORAL | Status: DC
Start: 2014-12-01 — End: 2015-10-10

## 2014-12-01 MED ORDER — MELOXICAM 15 MG PO TABS: 15.0000 mg | ORAL_TABLET | Freq: Every day | ORAL | Status: DC

## 2014-12-01 MED ORDER — INSULIN ASPART PROT & ASPART (70-30 MIX) 100 UNIT/ML PEN: PEN_INJECTOR | SUBCUTANEOUS | Status: DC

## 2014-12-01 MED ORDER — COMBIVENT RESPIMAT 20-100 MCG/ACT IN AERS: 1.0000 | INHALATION_SPRAY | Freq: Three times a day (TID) | RESPIRATORY_TRACT | Status: DC | PRN

## 2014-12-01 NOTE — Telephone Encounter (Signed)
90 day supply authorized and sent   

## 2014-12-04 ENCOUNTER — Encounter: Payer: Self-pay | Admitting: Pharmacist

## 2014-12-04 ENCOUNTER — Telehealth: Payer: Self-pay | Admitting: Internal Medicine

## 2014-12-04 NOTE — Telephone Encounter (Signed)
Pt dropped letter for order for therapeutic footwear. Letter in Dr. Georgeann Oppenheim box/msn

## 2014-12-04 NOTE — Patient Outreach (Signed)
On Friday, 12/01/14, stopped by Mrs. Spengler's home to provide her with a copy of the following list of all of the prices that were provided to [place] by [facility] representatives for the approximate costs of her medications. Discussed with the patient how this may be helpful when [facility] Mail Order calls prior to shipping the medications to verify what charges she should expect.    Harlow Asa, PharmD Clinical Pharmacist [place] [facility] [phone]  Medication    Directions   What is it for?  [facility]       90 Day Supply 30 Day Supply   albuterol (2.5 MG/3ML) 0.083% neb solution  Use 1 vial via nebulizer every 6 hours as needed for wheezing or shortness of breath.  Shortness of Breath     alendronate (FOSAMAX) 70 MG tablet  Take 1 tablet with a full glass of water on an empty stomach every Sunday   Bone Health $5.98 $2.23   ALPRAZolam (XANAX) 0.5 MG tablet  Take 1 tablet by mouth daily as needed Sleep/Anxiety $1.76 $0.82   aspirin 81 MG EC tablet  Take 1 tablet by mouth daily. Blood Thinner   OTC  B-D UF III MINI PEN NEEDLES   Use as directed with Novolog Flexpen  Supply $0 $0   BRILINTA 90 MG  tablet  Take 1 tablet by mouth 2 (two) times daily.  Blood Thinner $80 $25.80   COMBIVENT RESPIMAT 20-100 MCG/ACT Inhale 1 puff into the lungs 3 times daily as needed.  Shortness of Breath $80 $26.66   fluticasone (FLONASE) 50 MCG/ACT spray        Use 2 sprays into the nose daily.  Nasal Allergies $17.75 $6.15   furosemide (LASIX) 20 MG tablet  Take 1 tablet by mouth daily.  Fluid Pill $1.68 $0.79   TRUETEST TEST test strip  Use to check blood sugar three times daily Supply     NOVOLOG 70/30 100 UNIT/ML FlexPen  Inject 44 units under the skin before breakfast and 60 units before dinner.  Blood Sugar $80 $26.56   isosorbide (IMDUR) 30 MG ER tablet  Take 1 tablet by mouth daily.  Blood Pressure $6.70 $2.47   losartan (COZAAR) 100 MG  tablet  Take 1 tablet by mouth once daily Blood Pressure $17.68 $6.13   meloxicam (MOBIC) 15 MG tablet  Take 1 tablet by mouth daily.  Arthritis $4.61 $1.77   metoprolol tartrate 25 MG tablet  Take 1/2 tablet by mouth 2 (two) times daily.  Blood Pressure $2.87 $1.19   PARoxetine (PAXIL) 10 MG tablet  Take 1 tablet by mouth daily.  Mood $5.99 $2.23   pregabalin (LYRICA) 100 MG capsule  Take 1 capsule by mouth 3 (three) times daily.  Neuropathy $80 $25.80   rosuvastatin (CRESTOR) 20 MG tablet  Take 1 tablet by mouth daily.  Cholesterol $24.00 $7.80   traMADol (ULTRAM) 50 MG tablet  Take 1 tablet by mouth every 6 hours as needed for moderate pain.  Pain $12.01 $4.24   TRUEPLUS LANCETS 30G  Use to check blood sugar three times daily Supply     Vitamin D 50,000 UNITS Capsule Take 1 capsule by mouth every 7 (seven) days Supplement $3.69 $1.46

## 2014-12-04 NOTE — Telephone Encounter (Signed)
In red folder. 

## 2014-12-05 NOTE — Telephone Encounter (Signed)
I do not sign these forms unless patient is requesting them in a visit.  She willl need to discuss during next visit

## 2014-12-06 ENCOUNTER — Other Ambulatory Visit: Payer: Self-pay | Admitting: Pharmacist

## 2014-12-06 ENCOUNTER — Other Ambulatory Visit: Payer: Self-pay | Admitting: *Deleted

## 2014-12-06 ENCOUNTER — Telehealth: Payer: Self-pay | Admitting: *Deleted

## 2014-12-06 DIAGNOSIS — I255 Ischemic cardiomyopathy: Secondary | ICD-10-CM

## 2014-12-06 MED ORDER — PAROXETINE HCL 10 MG PO TABS: 10.0000 mg | ORAL_TABLET | Freq: Every day | ORAL | Status: DC

## 2014-12-06 MED ORDER — PREGABALIN 100 MG PO CAPS: 100.0000 mg | ORAL_CAPSULE | Freq: Three times a day (TID) | ORAL | Status: DC

## 2014-12-06 MED ORDER — INSULIN ASPART PROT & ASPART (70-30 MIX) 100 UNIT/ML PEN: PEN_INJECTOR | SUBCUTANEOUS | Status: DC

## 2014-12-06 MED ORDER — COMBIVENT RESPIMAT 20-100 MCG/ACT IN AERS: 1.0000 | INHALATION_SPRAY | Freq: Three times a day (TID) | RESPIRATORY_TRACT | Status: DC | PRN

## 2014-12-06 MED ORDER — ALBUTEROL SULFATE (2.5 MG/3ML) 0.083% IN NEBU: 2.5000 mg | INHALATION_SOLUTION | Freq: Four times a day (QID) | RESPIRATORY_TRACT | Status: DC | PRN

## 2014-12-06 MED ORDER — FLUTICASONE PROPIONATE 50 MCG/ACT NA SUSP: 2.0000 | Freq: Every day | NASAL | Status: DC

## 2014-12-06 MED ORDER — BRILINTA 90 MG PO TABS: 90.0000 mg | ORAL_TABLET | Freq: Two times a day (BID) | ORAL | Status: DC

## 2014-12-06 MED ORDER — METOPROLOL TARTRATE 25 MG PO TABS: 12.5000 mg | ORAL_TABLET | Freq: Two times a day (BID) | ORAL | Status: DC

## 2014-12-06 NOTE — Patient Outreach (Signed)
Called Mrs. Pamela Collier to see what questions she has about receiving her prescriptions through [facility]. Patient reports that Mail Order called her about her albuterol nebulizer solution, but that she did not understand. She said that the representative told her that they would send this through a Part B vendor. Let her know that this prescription was one that we had asked to not be sent to Mail Order because it needs to be billed through Part B. Let Mrs. Dilg know that I would contact [facility]. She states that she will be out of the house because she has a dental appointment today, but will call me when she gets home.  Harlow Asa, PharmD Clinical Pharmacist [place] [facility] [phone]

## 2014-12-06 NOTE — Patient Outreach (Signed)
Received a call back from [facility] at Dr. Lupita Dawn office. Notified her that [facility] reported that they had not yet received the following prescriptions and asked that she/Dr.Tullo fax 90 day prescriptions for each of these:  -Brilinta -Lyrica -fluticasone -Novolog Flexpens -metoprolol -paroxetine  For Novolog, let Bernestine Amass know that based on the prescribed direction of 60 units subcutaneously twice daily, 7 boxes of pens, 105 mL, would be a 87.5 day supply. Bernestine Amass stated that she would send this prescription in for this supply.  Also, as Combivent was sent in for 1 inhaler. As this is prescribed to be used only three times daily, that makes this inhaler a 40 day supply and her copay $53. If directions are to be sent for this inhaler to be used three times daily, request that this prescription be sent for 2 inhalers, which would instead be a 80 day supply, cost $80 and be more cost effective. Bernestine Amass stated that she would send this as well.  Harlow Asa, PharmD Clinical Pharmacist [place] [facility] [phone]

## 2014-12-06 NOTE — Patient Outreach (Signed)
Called and spoke with representative at [facility]. The representative states that no prescription for Pamela Collier has been sent to them.  Harlow Asa, PharmD Clinical Pharmacist [place] [facility] [phone]

## 2014-12-06 NOTE — Patient Outreach (Signed)
Called to follow up with Pamela Collier to let her know that:  1) [facility] will not be filling her albuterol nebulizer solution. Reminded patient that she will still be getting this from [facility], as it is billed through Part B  2) Let patient know that Dr. Lupita Dawn office sent in many of her prescriptions and will be sending in the rest today.  Let Pamela Collier know that I will follow up with her on Friday, 12/08/14, regarding getting her first order from [place] sent.  Harlow Asa, PharmD Clinical Pharmacist [place] [facility] [phone]

## 2014-12-06 NOTE — Telephone Encounter (Signed)
Left message for patient to call office.  

## 2014-12-06 NOTE — Patient Outreach (Signed)
Contacted Dr. Lupita Dawn office to request that they fax 90 day prescriptions for each of these:  -Brilinta -Lyrica -fluticasone -Novolog Flexpens -metoprolol -paroxetine   Also, as Combivent was sent in for 1 inhaler. As this is prescribed to be used only three times daily, that makes this inhaler a 40 day supply and her copay $53. If directions are to be sent for this inhaler to be used three times daily, will request that this prescription be sent for 2 inhalers, which would instead be a 80 day supply, cost $80 and be more cost effective.   Harlow Asa, PharmD Clinical Pharmacist [place] [facility] [phone]

## 2014-12-06 NOTE — Telephone Encounter (Signed)
Pt signed a release for [facility]. This was done on 10/12/14 and I have been trying for months to get the request faxed. I called back today 12/06/14 to confirm the fax [phone]. I have been unsuccessfull in my attempt to get this fax to go thru. I finally got the request to go thru at 4:41.

## 2014-12-06 NOTE — Patient Outreach (Signed)
Called and spoke with representative at [employer]. Representative states that they received the prescription for albuterol nebulizer solution, but that it needed to be billed through Part B. Stated that for this reason they did not fill the nebulizer solution, but that it may have been sent to [facility]. Advised that I call this medical supply company at [phone] to make sure that it was not sent there.  Asked to confirm that the rest of Mrs. Risper prescriptions have been received from Dr. Lupita Dawn office. Representative states that they do not yet have the following prescriptions:  -Brilinta -Lyrica -fluticasone -Novolog Flexpens -metoprolol -paroxetine  Will contact Dr. Lupita Dawn office to request that they fax 90 day prescriptions for each of these.  Also, note that Combivent was sent in for 1 inhaler. As this is prescribed to be used only three times daily, that makes this inhaler a 40 day supply and her copay $53. If directions are to be sent for this inhaler to be used three times daily, will request that this prescription be sent for 2 inhalers, which would instead be a 80 day supply, cost $80 and be more cost effective.   Harlow Asa, PharmD  Clinical Pharmacist [place] [facility] [phone]

## 2014-12-07 ENCOUNTER — Telehealth: Payer: Self-pay | Admitting: Gastroenterology

## 2014-12-07 ENCOUNTER — Ambulatory Visit: Payer: Medicare Other | Admitting: Gastroenterology

## 2014-12-07 NOTE — Telephone Encounter (Signed)
Patient is returning your call for triage

## 2014-12-07 NOTE — Telephone Encounter (Signed)
Left message for patient to return call to office. 

## 2014-12-08 ENCOUNTER — Encounter: Payer: Self-pay | Admitting: Pharmacist

## 2014-12-08 NOTE — Telephone Encounter (Signed)
LVM letting pt know she is already scheduled for her colonoscopy. It is on 01-09-15. Advised pt to call the office if she hasn't received her paperwork.

## 2014-12-08 NOTE — Telephone Encounter (Signed)
Patient notified and voiced understanding.

## 2014-12-08 NOTE — Patient Outreach (Signed)
Met with patient in her home to help her to initiate [facility] Mail Order. Go through prescription bottles with the patient. Pamela Collier has about a 2 week supply of all of her medications except for her alendronate. She is out of her alendronate and due to take another dose this Sunday. Advise patient to get the alendronate filled through [place], as Mail Order representative has stated that it would take about eight days for them to get medication to her.   Pamela Collier asks if I could come back next week to help her order her medications, as this will be when they are due through Mail Order. Will meet with Pamela Collier on 12/13/14 at 9:30 AM in her home.  Also discussed the potential change of Brilinta to clopidogrel for cost savings for the patient. Patient has stated that she believes that she was on Plavix in the past, but switched to Brilinta by her Cardiologist in [place]. Reports that she cannot remember why. Cannot currently think of the Cardiologist's name. However, states that she will have his name and number for me when I return on Wednesday.  Harlow Asa, PharmD Clinical Pharmacist [place] [facility] [phone]

## 2014-12-13 ENCOUNTER — Other Ambulatory Visit: Payer: Self-pay | Admitting: Pharmacist

## 2014-12-13 NOTE — Patient Outreach (Signed)
Met with Mrs. Pamela Collier today in her home to help her to order her first set of medications from [facility]. Called [facility] with the patient and spoke with [place]. Ordered a 90 day supply of the following medications that the patient was running low on: BD Pen Needles, Brilinta, Novolog Flexpens, isosorbide, losartan, meloxicam, metoprolol and paroxetine. [name] stated that the patient should expect for these medications to arrive in 3-5 business days.  Patient reports having been feeling weak in her legs. Reports that they have been hurting since she has been doing her walking. Discussed, demonstrated and had the patient try doing a calf muscle stretch. Patient reports that this helps the pain in her calf and that she will do this before and after walking. Also discussed patient's fall risk due to her weakness in her legs. Patient reports that she has three canes, but that she does not carry them with her in the house, only when she goes out. Discussed the importance of carrying a cane with her at all times, even when in the home. Patient states that she knows that she should and will do so.  Patient has a second pillbox, which has 4 slots/day. Patient reports that she put an extra two weeks of medication in this second pillbox to try to figure out how much she had left. Discussed with patient the importance of just using one pillbox and not putting multiple weeks of medication in a single box. Helped patient to put the medication in this second box back into her bottles. While doing so, note that the medications put in from day to day in this box are different. Patient reports that this is because she was running out of some by the time she got to filling this box. Check patient's regular pillbox for consistency to the prescribed dosing directions for each medication. Patient has accurately filled her regular pillbox. Remind patient to only use this one box. Patient agreed to  this.  Patient provides me with the name an contact information of both her Cardiologist in [place], Trezor Atherley [phone]), and her local Cardiologist, Dr. Clayborn Bigness [phone]).  Will follow up with [name] again on 12/25/14 to make sure that she was successful in getting her Mail Order medications. Will also follow up with the patient to make sure that she is using her cane when she walks around the house.  Will follow up with [name] cardiologists about potentially changing the patient's Brilinta to clopidogrel for cost savings.    Harlow Asa, PharmD Clinical Pharmacist [place] [facility] [phone]

## 2014-12-15 ENCOUNTER — Other Ambulatory Visit: Payer: Self-pay | Admitting: *Deleted

## 2014-12-15 ENCOUNTER — Other Ambulatory Visit: Payer: Self-pay | Admitting: Pharmacist

## 2014-12-15 ENCOUNTER — Ambulatory Visit: Payer: Medicare Other | Admitting: Pharmacist

## 2014-12-15 NOTE — Patient Outreach (Signed)
[facility]) Care Management  12/15/2014  Pamela Collier 03-15-1941 [id number]  Phone call to patient to schedule next home visit to complete her Advanced Directive.  Per patient, she has read through the document but she as well as her spouse  is not ready to complete it.  Patient has no other social work needs at this time.  Advanced Directive materials provided.  EMMI on the topic assigned.   Plan:  Patient to be closed to social work at this time.   [name] [facility] Care Management [phone]

## 2014-12-15 NOTE — Patient Outreach (Signed)
Called to speak with Mrs. Sprayberry Cardiologist in [place], Dr. Almeta Monas [phone]) about potentially changing the patient's Brilinta to clopidogrel for cost savings. Patient has reported that she believes that she was on Plavix in the past, but switched to Brilinta. Reports that she does not know why. Left a message with Vaughan Basta at Dr. Kallie Locks office. If have not heard back from Dr. Luther Bradley by 12/18/14, will give his office another call then.   Harlow Asa, PharmD Clinical Pharmacist [place] [facility] [phone]

## 2014-12-18 ENCOUNTER — Other Ambulatory Visit: Payer: Self-pay | Admitting: *Deleted

## 2014-12-18 NOTE — Patient Outreach (Signed)
[facility] [facility]) Care Management   12/18/2014  Pamela Collier [birth year] [id number]  Pamela Collier is an 74 y.o. female  Subjective: "I keep reading these instructions about my colonoscopy every day because I am afraid I am going to accidentally forget to do something on one of the days." "I would like to know what my blood pressure is but I forget to take it. With all these medications I probably need to know what it is."   Objective: Blood pressure 138/62, pulse 72, resp. rate 18, height 1.6 m (5\' 3" ), weight 190 lb (86.183 kg), SpO2 96 %.(on room air)   Review of Systems  Constitutional: Negative.   HENT: Negative.   Eyes: Negative.   Cardiovascular: Positive for claudication. Negative for leg swelling.  Gastrointestinal: Negative for diarrhea.  Musculoskeletal: Positive for falls.  Skin: Negative.   Endo/Heme/Allergies: Bruises/bleeds easily.    Physical Exam  Constitutional: She is oriented to person, place, and time. She appears well-developed and well-nourished.  Cardiovascular: Normal rate, regular rhythm and normal heart sounds.   Pulses:      Dorsalis pedis pulses are 2+ on the right side, and 2+ on the left side.  Respiratory: Effort normal and breath sounds normal.  GI: Soft. Bowel sounds are normal.  Musculoskeletal:       Right lower leg: She exhibits no swelling.       Left lower leg: She exhibits no swelling.  Neurological: She is alert and oriented to person, place, and time.  Skin: Skin is warm and dry.     Psychiatric: She has a normal mood and affect. Her speech is normal and behavior is normal. Judgment and thought content normal. Cognition and memory are normal.    Current Medications:  Medications reviewed please see encounter for currently taking meds.    Functional Status:   In your present state of health, do you have any difficulty performing the following activities: 10/20/2014  Hearing? N  Vision? N  Difficulty concentrating  or making decisions? N  Walking or climbing stairs? Y  Dressing or bathing? N  Doing errands, shopping? N  Preparing Food and eating ? N  Using the Toilet? N  In the past six months, have you accidently leaked urine? N  Do you have problems with loss of bowel control? N  Managing your Medications? N  Managing your Finances? Y  Housekeeping or managing your Housekeeping? Y    Fall/Depression Screening:    PHQ 2/9 Scores 10/30/2014 10/30/2014 10/20/2014 07/23/2014 11/22/2013 08/03/2013 04/03/2012  PHQ - 2 Score 0 0 3 1 2  0 2  PHQ- 9 Score - - 5 - 3 - 10  Exception Documentation - - - Other- indicate reason in comment box - - -   Fall Risk  12/18/2014 11/15/2014 10/30/2014 10/30/2014 10/20/2014  Falls in the past year? Yes - Yes Yes Yes  Number falls in past yr: 2 or more 1 - 1 1  Injury with Fall? No - - No No  Risk Factor Category  High Fall Risk - - - -  Risk for fall due to : Medication side effect;History of fall(s) - History of fall(s);Impaired balance/gait Impaired balance/gait;History of fall(s) Medication side effect  Risk for fall due to (comments): - - uses cane for ambulation - -  Follow up Education provided;Falls prevention discussed - - Education provided Falls prevention discussed    Assessment: General: Pt in good spirits when RNCM arrived. Pt reported med alert system was placed  in her home and it was making her feel safer although she stated her husband was complaining about the price. Pt reported having one fall without injury since RNCM's last visit. RNCM and pt discussed ways to prevent falls and for pt to have cane while walking.   Diabetes: Pt has improved her blood sugar averages and reports trying to really be careful about her diet. Pt has seen the podiatrist related to callus on the bottom of left foot and while he was trimming her toe nails found an "ulcer" on her big toe according to pt. Pt is wearing open toed shoes to keep ulcer from getting worse. Pt stating she  continued to have trouble knowing how to count carbs. RNCM and pt watched EMMI on how to count carbs. Pt voiced a better understanding and was going to review food exchange lists she received from diabetic classes.  HF: Pt with out noted edema to legs, abd, or hands. Pt doing a great job recording weights daily. RNCM did note some 3lb weight gains in 1 day and showed pt. Reinforced with pt the importance of letting MD know of 3lb increase in one day or 5lb increase in 1 week. Pt's weight at baseline on this day, with out further s/s of HF. Pt did state she had been getting calf pains while trying to exercise. Pt able to verbalize all s/s of worsening HF. Pt given salt smart hand out with sodium amounts in a lot of common foods.  CAD: Pt knows s/s of heart attack and stroke. RNCM discussed importance of knowing B/P. Assisted pt with making a reminder on her calendar to take b/p weekly.  Colonoscopy: Pt concerned about multi-level, multi-day instructions given at pre-op visit. RNCM and pt went through all instructions carefully. Placing reminders on her calendars and special diets on refrigerator with days to start. RNCM discussed with pt safely being on a clear liquid diet as a diabetic. RNCM and pt discussed sure prep and its directions. RNCM made sure pt was able to identify the medications and their specific directions for the procedure.  Pt voiced better understanding and ability to follow directions as given.  Advanced Directive: RNCM talked with pt about her advanced directive related to she had cancelled appointment with SW to fill it out. Pt did not have a good understanding of what "living will" meant and verbalized she thought it had something to do with money. RNCM explained what Advanced Directive meant and that with her going soon for a procedure it would be a good time to have this done and placed in her chart. Pt requested SW call her to reschedule.  Plan: Follow appointment made with pt for  1 month. RNCM will make SW aware pt is ready to complete advanced directive.    Rutherford Limerick RN, BSN  [facility] Care Management [phone])

## 2014-12-19 ENCOUNTER — Other Ambulatory Visit: Payer: Self-pay | Admitting: *Deleted

## 2014-12-19 NOTE — Patient Outreach (Signed)
[facility]) Care Management  12/19/2014  Pamela Collier [birth year] [id number]   Phone call to patient to schedule home visit to complete her Advanced Directive.  HIPPA compliant voicemail message left for a return call.    [name] [facility] Care Management [phone]

## 2014-12-22 ENCOUNTER — Other Ambulatory Visit: Payer: Self-pay | Admitting: Pharmacist

## 2014-12-22 NOTE — Patient Outreach (Signed)
Called to speak with Mrs. Orner Cardiologist in [place], Dr. Almeta Monas [phone]) about potentially changing the patient's Brilinta to clopidogrel for cost savings. Patient has reported that she believes that she was on Plavix in the past, but switched to Brilinta. Reports that she does not know why. Left a message on voicemail at Dr. Kallie Locks office. Note from this voicemail that Dr. Luther Bradley is only in the office on Tuesdays and Thursdays. If have not heard back from Dr. Luther Bradley by 12/25/14, will speak with Ms. Beerman about having her follow up with the physician.   Harlow Asa, PharmD Clinical Pharmacist [place] [facility] [phone]

## 2014-12-25 ENCOUNTER — Other Ambulatory Visit: Payer: Self-pay | Admitting: Pharmacist

## 2014-12-25 NOTE — Patient Outreach (Signed)
Called to follow up with Mrs. Boudreaux to make sure that she was successful in getting her Mail Order medications. Mrs. Nabarro reports that last week she got all of the ones that we ordered together. Reports that she is getting ready to order the next round of medication that she needs today. Reports that she has the number to call, but will call me if she has any problems.  Patient reports that her legs are doing fine. States that they are tired from all of the moving around with the family reunion that she had this past weekend. Reports that she is using her cane when she walks around the house and has not had any falls.  Let Mrs. Osceola know that I have been calling her Cardiologist in [place], Dr. Luther Bradley, but have not been able to get in touch with him. Patient states that she will call Dr. Luther Bradley [phone]) about potentially changing her Brilinta to clopidogrel for cost savings. Patient again states that she believes that she was on Plavix in the past, but switched to [place]. Again reports that she does not know why. However, states that she will call him today.  Mrs. Andrew reports that she has no medication questions for me at this time, but that she has my number and will call me if she does and/or when she hears back from Dr. Luther Bradley. Will follow up with Mrs. Aurand again on 01/08/15.  Harlow Asa, PharmD Clinical Pharmacist [place] [facility] [phone]

## 2014-12-27 ENCOUNTER — Other Ambulatory Visit: Payer: Self-pay | Admitting: Pharmacist

## 2014-12-27 NOTE — Patient Outreach (Signed)
Received a message from Nurse Care Manager Janci Minor while I was out of the office on 12/26/14 that Mrs. Leazer had contacted her. Mrs. Mukes was having difficulty in ordering her medications through [facility] and needed my help to learn to navigate that system. Let Merlene Morse know that I could meet with Mrs. Nogle Wednesday morning.  Presented to Mrs. Rape's house this morning at 9:30AM. Patient reports that she is 1 week from being out of her rosuvastatin and reports that she is unable to order it because the automated system does not seem to understand her, hanging up on her each time. Call [facility] with the patient to order this medication and provide Mrs. Wendi Snipes with large written instructions that will get her to a live representative when she calls.   While in her home, review Mrs. Kinney's medication pillbox with her and find that the patient has put multiple doses of her 1/2 tablet of metoprolol in some of her days pill slots. Fix this with the patient. Mrs. Hem reports that she has been having trouble with her vision over the last few days, having blurriness in her left eye. Reports that she has had an eye exam in the past year. Advise patient to call her eye doctor to be seen. Patient reports that she has also not been wearing her glasses when filling her pillbox.   Plan:  Mrs. Turkovich to call and schedule an appointment to be seen by her eye doctor  Mrs. Odell to always wear her eye glasses while filling her pillbox.  Will follow up with Mrs. Denker as previously planned on 01/08/15 by phone call.  Harlow Asa, PharmD Clinical Pharmacist [facility] [facility] [phone]

## 2014-12-28 ENCOUNTER — Telehealth: Payer: Self-pay

## 2014-12-28 NOTE — Telephone Encounter (Signed)
Called pt regarding her blood thinner brilinta. Per Dr. Clayborn Bigness, pt is to stop Brilinta 5 days prior to colonoscopy (01-09-15) and restart 2 days after procedure. Pt has been notified of these instructions. Advised her to speak with Dr. Allen Norris or contact me directly if she has any questions concerning this. Pt  Expressed understanding.

## 2014-12-30 IMAGING — CR DG CHEST 2V
1 series · 2 of 2 positions shown · non-contrast
Comparison: none

REASON FOR EXAM: dyspnea
COMMENTS:

[Series 1: pa · 0.17mm/px · 2 of 2 slices shown]
[im 1/2]
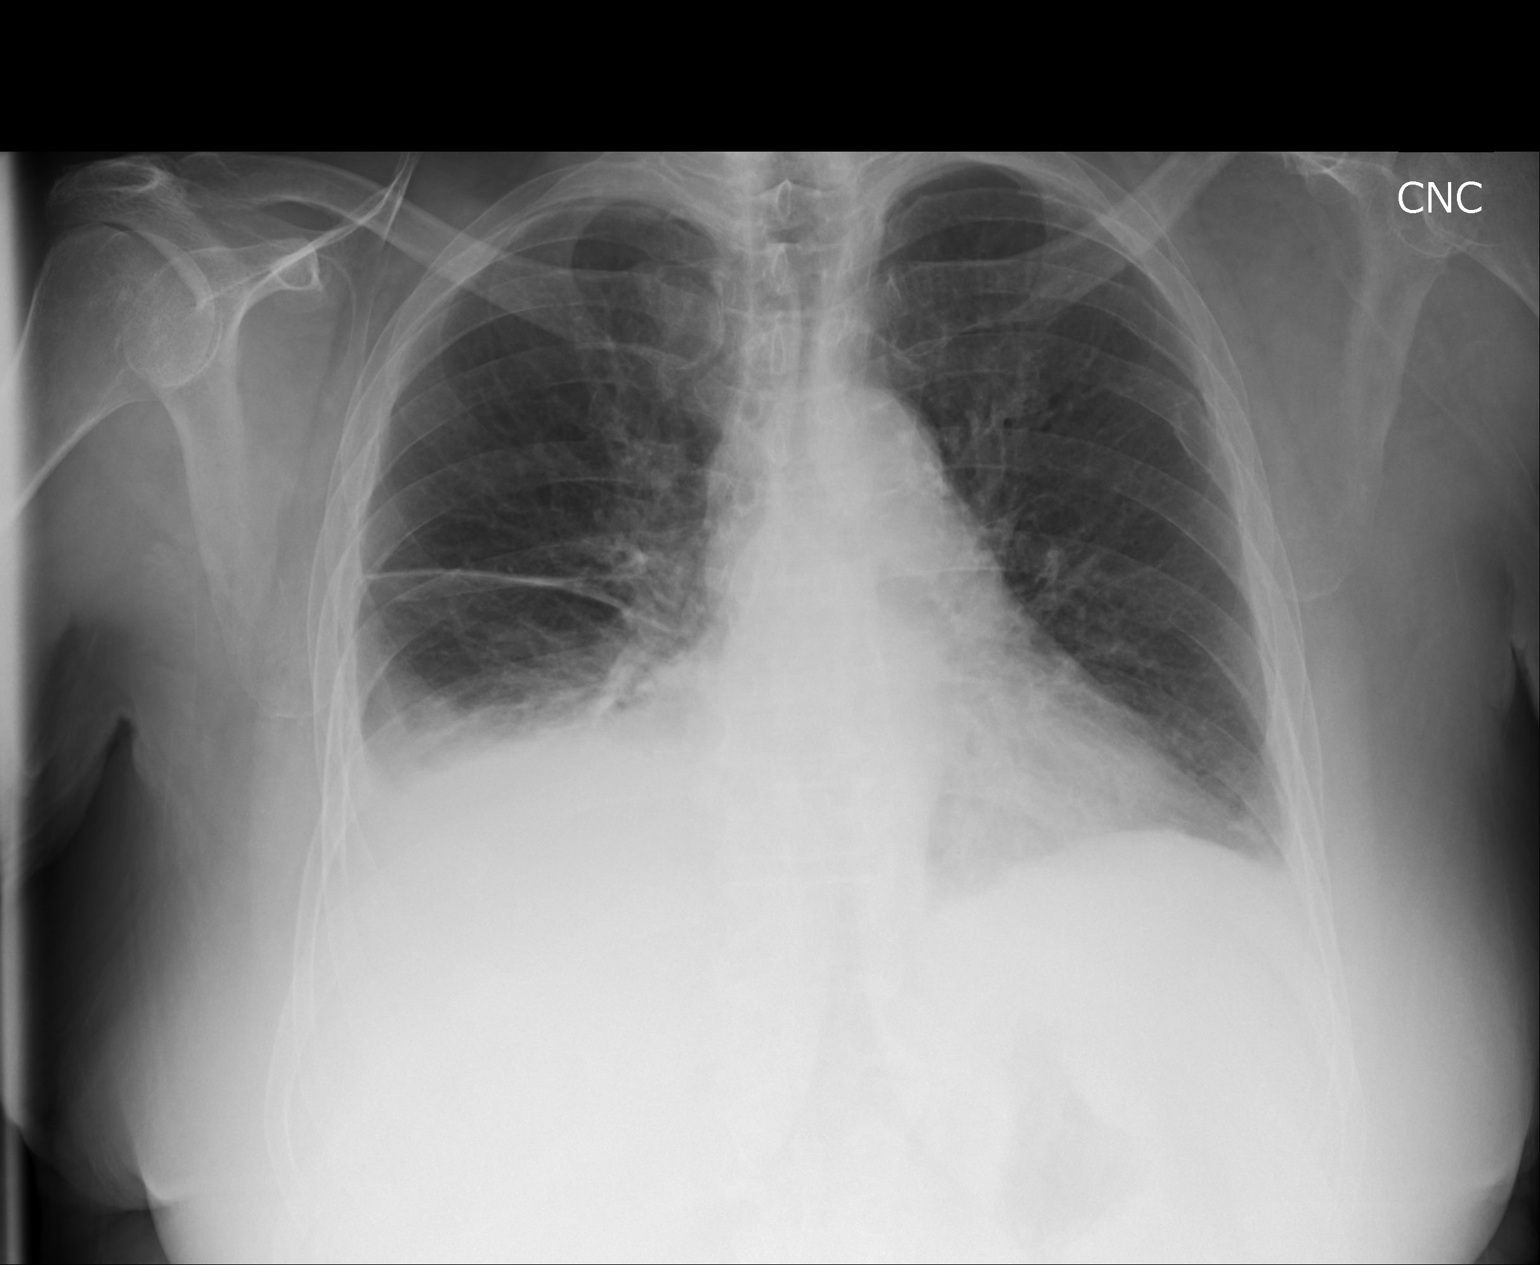
[im 2/2]
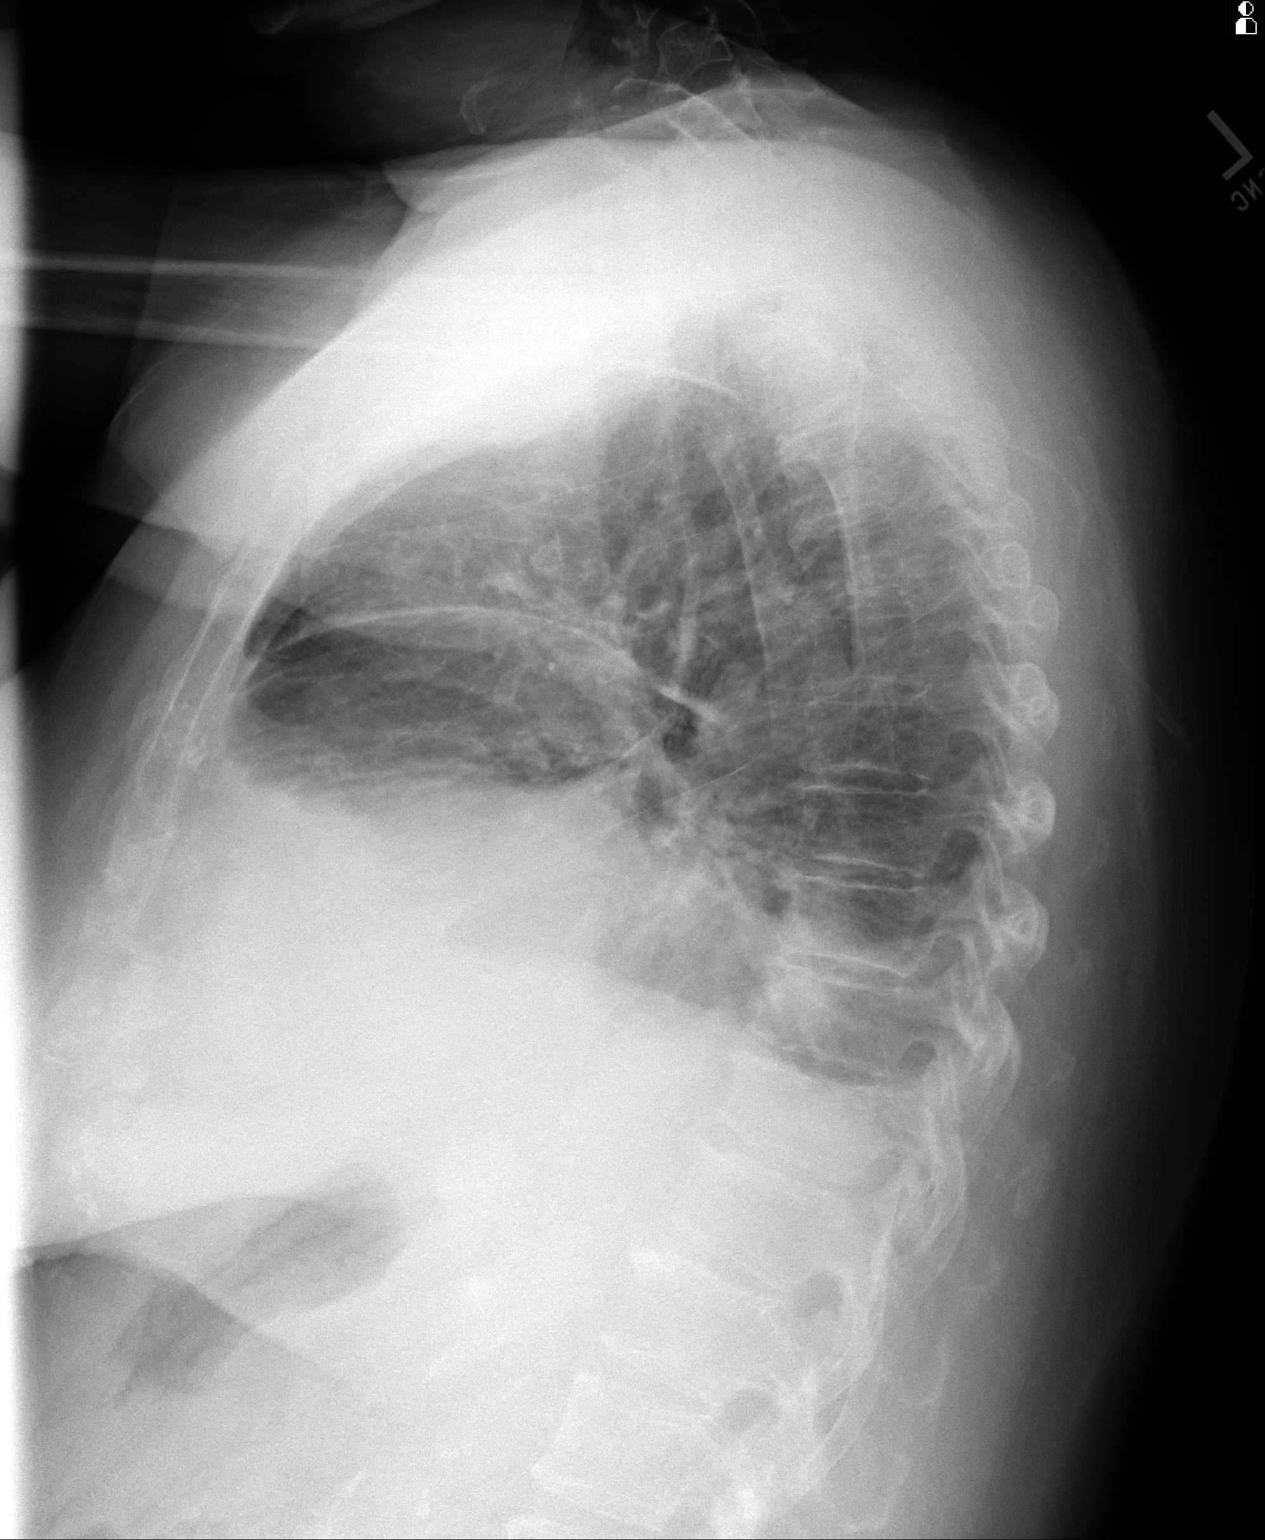

[2 of 2 positions shown; findings below may reference images not displayed]

PROCEDURE:     DXR - DXR CHEST PA (OR AP) AND LATERAL  - September 06, 2012 [DATE]

RESULT:     Comparison is made to the study June 14, 2012.

Since the previous study there has developed further volume loss on the
right. There is fluid in the minor fissure. On the lateral film increased
pleural fluid on the right is clearly evident. There is a trace of blunting
of the left lateral and posterior costophrenic angles. The cardiac
silhouette is top normal in size. The pulmonary vascularity is indistinct.
IMPRESSION: Since the previous study there have developed signs of
worsening CHF with a larger pleural effusion on the right. There is a small
amount of pleural fluid in the left hemithorax as well.

[REDACTED]

## 2015-01-08 ENCOUNTER — Other Ambulatory Visit: Payer: Self-pay | Admitting: Pharmacist

## 2015-01-08 NOTE — Patient Outreach (Signed)
Called to follow up with Pamela Collier to see how she is doing. Note that per EPIC she has her Endoscopy scheduled for tomorrow.   Patient reports that she has everything ready for her endoscopy and that the nurse with the office wrote out a schedule for her for everything that she needs to do before tomorrow.   Reports that she had a conflict with [facility]. Note that I called with Pamela Collier in the past to [place] when she first authorized the company to send her medication and again to help her to refill medication after this. On each phone call, Pamela Collier informed the representatives that she did not want to provide a credit card or bank account to be directly billed, but instead wished to receive a bill through the mail that she would then pay. However, Pamela Collier has found that [facility] got hold of her bank information and took the money from her account. Pamela Collier reports that she had used [facility] mail order in the past and that they had her billing information. Note that per the [facility] website, [facility] and [employer] have merged. Pamela Collier reports that she has now changed her banking number to prevent [facility] from doing this again. Advised patient that when she next speaks with [facility], to let them know that this occurred and that she never authorized this billing.  Ask Pamela Collier about her vision, as she reported that she was having cloudy vision last time I saw her. Patient reports that she did not go to see her eye doctor because she realized that this vision problem was a result of an incident that she had happened earlier that day. Reports that prior to having seen me last time, she had accidentally sprayed air freshener into her eye earlier and that with further flushing out of the eye over the course of the day, her vision was back to normal. Asked patient about her wearing her glasses. Pamela Collier  reports that she always wears them while driving and now also always wears them when filling her pillbox.  Reports that she has recently started having itching each night, starting about 3-4 days ago. Reports that she does not have any swelling or visible signs on her skin, but that her skin just itches. Reports that she has had no changes in medications, just change in medication manufacturers.  Pamela Collier reports that she has no medication questions for me at this time.  Plan:  Will follow up with Pamela Collier as previously planned on 01/24/15 by phone call.  Harlow Asa, PharmD Clinical Pharmacist [place] [facility] [phone]

## 2015-01-09 ENCOUNTER — Encounter
Admission: RE | Disposition: A | Discharge status: Home / Self Care | Payer: Self-pay | Source: Ambulatory Visit | Attending: Gastroenterology

## 2015-01-09 ENCOUNTER — Encounter: Payer: Self-pay | Admitting: *Deleted

## 2015-01-09 ENCOUNTER — Ambulatory Visit
Admission: RE | Admit: 2015-01-09 | Discharge: 2015-01-09 | Disposition: A | Discharge status: Home / Self Care | Payer: Medicare Other | Source: Ambulatory Visit | Attending: Gastroenterology | Admitting: Gastroenterology

## 2015-01-09 ENCOUNTER — Ambulatory Visit: Payer: Medicare Other | Admitting: Anesthesiology

## 2015-01-09 DIAGNOSIS — I2510 Atherosclerotic heart disease of native coronary artery without angina pectoris: Secondary | ICD-10-CM | POA: Diagnosis not present

## 2015-01-09 DIAGNOSIS — E785 Hyperlipidemia, unspecified: Secondary | ICD-10-CM | POA: Diagnosis not present

## 2015-01-09 DIAGNOSIS — Z7951 Long term (current) use of inhaled steroids: Secondary | ICD-10-CM | POA: Insufficient documentation

## 2015-01-09 DIAGNOSIS — F418 Other specified anxiety disorders: Secondary | ICD-10-CM | POA: Insufficient documentation

## 2015-01-09 DIAGNOSIS — Z882 Allergy status to sulfonamides status: Secondary | ICD-10-CM | POA: Diagnosis not present

## 2015-01-09 DIAGNOSIS — Z794 Long term (current) use of insulin: Secondary | ICD-10-CM | POA: Insufficient documentation

## 2015-01-09 DIAGNOSIS — Z8673 Personal history of transient ischemic attack (TIA), and cerebral infarction without residual deficits: Secondary | ICD-10-CM | POA: Diagnosis not present

## 2015-01-09 DIAGNOSIS — D860 Sarcoidosis of lung: Secondary | ICD-10-CM | POA: Insufficient documentation

## 2015-01-09 DIAGNOSIS — K529 Noninfective gastroenteritis and colitis, unspecified: Secondary | ICD-10-CM | POA: Insufficient documentation

## 2015-01-09 DIAGNOSIS — Z6833 Body mass index (BMI) 33.0-33.9, adult: Secondary | ICD-10-CM | POA: Diagnosis not present

## 2015-01-09 DIAGNOSIS — Z7982 Long term (current) use of aspirin: Secondary | ICD-10-CM | POA: Diagnosis not present

## 2015-01-09 DIAGNOSIS — E1151 Type 2 diabetes mellitus with diabetic peripheral angiopathy without gangrene: Secondary | ICD-10-CM | POA: Insufficient documentation

## 2015-01-09 DIAGNOSIS — I10 Essential (primary) hypertension: Secondary | ICD-10-CM | POA: Diagnosis not present

## 2015-01-09 DIAGNOSIS — E669 Obesity, unspecified: Secondary | ICD-10-CM | POA: Diagnosis not present

## 2015-01-09 DIAGNOSIS — E1121 Type 2 diabetes mellitus with diabetic nephropathy: Secondary | ICD-10-CM | POA: Insufficient documentation

## 2015-01-09 DIAGNOSIS — K635 Polyp of colon: Secondary | ICD-10-CM | POA: Diagnosis not present

## 2015-01-09 DIAGNOSIS — I509 Heart failure, unspecified: Secondary | ICD-10-CM | POA: Diagnosis not present

## 2015-01-09 DIAGNOSIS — D125 Benign neoplasm of sigmoid colon: Secondary | ICD-10-CM | POA: Diagnosis not present

## 2015-01-09 DIAGNOSIS — R197 Diarrhea, unspecified: Secondary | ICD-10-CM | POA: Diagnosis not present

## 2015-01-09 HISTORY — PX: COLONOSCOPY WITH PROPOFOL: SHX5780

## 2015-01-09 LAB — GLUCOSE, CAPILLARY: Glucose-Capillary: 90 mg/dL (ref 65–99)

## 2015-01-09 SURGERY — COLONOSCOPY WITH PROPOFOL
Anesthesia: General

## 2015-01-09 MED ORDER — PROPOFOL INFUSION 10 MG/ML OPTIME
INTRAVENOUS | Status: DC | PRN
  Administered 2015-01-09: 150 ug/kg/min via INTRAVENOUS

## 2015-01-09 MED ORDER — SODIUM CHLORIDE 0.9 % IV SOLN
INTRAVENOUS | Status: DC
  Administered 2015-01-09: 11:00:00 via INTRAVENOUS

## 2015-01-09 MED ORDER — LIDOCAINE HCL (CARDIAC) 20 MG/ML IV SOLN
INTRAVENOUS | Status: DC | PRN
  Administered 2015-01-09: 60 mg via INTRAVENOUS

## 2015-01-09 MED ORDER — PROPOFOL 10 MG/ML IV BOLUS
INTRAVENOUS | Status: DC | PRN
  Administered 2015-01-09: 50 mg via INTRAVENOUS
  Administered 2015-01-09: 30 mg via INTRAVENOUS

## 2015-01-09 NOTE — Anesthesia Postprocedure Evaluation (Signed)
  Anesthesia Post-op Note  Patient: Pamela Collier  Procedure(s) Performed: Procedure(s): COLONOSCOPY WITH PROPOFOL (N/A)  Anesthesia type:General  Patient location: PACU  Post pain: Pain level controlled  Post assessment: Post-op Vital signs reviewed, Patient's Cardiovascular Status Stable, Respiratory Function Stable, Patent Airway and No signs of Nausea or vomiting  Post vital signs: Reviewed and stable  Last Vitals:  Filed Vitals:   01/09/15 1150  BP: 181/64  Pulse: 64  Temp:   Resp: 17    Level of consciousness: awake, alert  and patient cooperative  Complications: No apparent anesthesia complications

## 2015-01-09 NOTE — Anesthesia Preprocedure Evaluation (Addendum)
Anesthesia Evaluation  Patient identified by MRN, date of birth, ID band Patient awake    Reviewed: Allergy & Precautions, NPO status , Patient's Chart, lab work & pertinent test results  Airway Mallampati: III       Dental no notable dental hx.    Pulmonary shortness of breath and with exertion,    + decreased breath sounds      Cardiovascular hypertension, Pt. on medications and Pt. on home beta blockers + angina with exertion + CAD and +CHF Normal cardiovascular exam    Neuro/Psych    GI/Hepatic negative GI ROS, Neg liver ROS,   Endo/Other  diabetes, Type 1, Insulin Dependent  Renal/GU      Musculoskeletal   Abdominal (+) + obese,   Peds  Hematology   Anesthesia Other Findings   Reproductive/Obstetrics                            Anesthesia Physical Anesthesia Plan  ASA: III  Anesthesia Plan: General   Post-op Pain Management:    Induction: Intravenous  Airway Management Planned: Nasal Cannula  Additional Equipment:   Intra-op Plan:   Post-operative Plan:   Informed Consent: I have reviewed the patients History and Physical, chart, labs and discussed the procedure including the risks, benefits and alternatives for the proposed anesthesia with the patient or authorized representative who has indicated his/her understanding and acceptance.     Plan Discussed with: CRNA  Anesthesia Plan Comments:         Anesthesia Quick Evaluation

## 2015-01-09 NOTE — Op Note (Signed)
[facility] Gastroenterology Patient [name] Procedure Date: 01/09/2015 10:57 AM MRN: [id number] Account #: [id number] Date of Birth: [birth year] Admit Type: Outpatient Age: 74 Room: [facility] ENDO ROOM 4 Gender: Female Note Status: Finalized Procedure:         Colonoscopy Indications:       Chronic diarrhea Providers:         Lucilla Lame, MD Referring MD:      Deborra Medina, MD (Referring MD) Medicines:         Propofol per Anesthesia Complications:     No immediate complications. Procedure:         Pre-Anesthesia Assessment:                    - Prior to the procedure, a History and Physical was                     performed, and patient medications and allergies were                     reviewed. The patient's tolerance of previous anesthesia                     was also reviewed. The risks and benefits of the procedure                     and the sedation options and risks were discussed with the                     patient. All questions were answered, and informed consent                     was obtained. Prior Anticoagulants: The patient has taken                     no previous anticoagulant or antiplatelet agents. ASA                     Grade Assessment: II - A patient with mild systemic                     disease. After reviewing the risks and benefits, the                     patient was deemed in satisfactory condition to undergo                     the procedure.                    After obtaining informed consent, the colonoscope was                     passed under direct vision. Throughout the procedure, the                     patient's blood pressure, pulse, and oxygen saturations                     were monitored continuously. The Colonoscope was                     introduced through the anus and advanced to the the cecum,  identified by appendiceal orifice and ileocecal valve. The                     colonoscopy  was performed without difficulty. The patient                     tolerated the procedure well. The quality of the bowel                     preparation was excellent. Findings:      The perianal and digital rectal examinations were normal.      Four pedunculated polyps were found in the sigmoid colon. The polyps       were 4 to 7 mm in size. These polyps were removed with a hot snare.       Resection and retrieval were complete.      Random biopsies were obtained with cold forceps for histology randomly       in the entire colon. Impression:        - Four 4 to 7 mm polyps in the sigmoid colon. Resected and                     retrieved.                    - Random biopsies were obtained in the entire colon. Recommendation:    - Await pathology results. Procedure Code(s): --- Professional ---                    [phone], Colonoscopy, flexible; with removal of tumor(s),                     polyp(s), or other lesion(s) by snare technique                    45380, 101, Colonoscopy, flexible; with biopsy, single or                     multiple Diagnosis Code(s): --- Professional ---                    K52.9, Noninfective gastroenteritis and colitis,                     unspecified                    D12.5, Benign neoplasm of sigmoid colon CPT copyright 2014 American Medical Association. All rights reserved. The codes documented in this report are preliminary and upon coder review may  be revised to meet current compliance requirements. Lucilla Lame, MD 01/09/2015 11:18:36 AM This report has been signed electronically. Number of Addenda: 0 Note Initiated On: 01/09/2015 10:57 AM Scope Withdrawal Time: 0 hours 6 minutes 58 seconds  Total Procedure Duration: 0 hours 13 minutes 6 seconds       [facility]

## 2015-01-09 NOTE — Transfer of Care (Signed)
Immediate Anesthesia Transfer of Care Note  Patient: Pamela Collier  Procedure(s) Performed: Procedure(s): COLONOSCOPY WITH PROPOFOL (N/A)  Patient Location: PACU  Anesthesia Type:General  Level of Consciousness: awake, alert  and oriented  Airway & Oxygen Therapy: Patient Spontanous Breathing and Patient connected to nasal cannula oxygen  Post-op Assessment: Report given to RN and Post -op Vital signs reviewed and stable  Post vital signs: Reviewed and stable  Last Vitals:  Filed Vitals:   01/09/15 1122  BP: 149/63  Pulse: 58  Temp: 36.2 C  Resp: 16    Complications: No apparent anesthesia complications

## 2015-01-09 NOTE — H&P (Signed)
[facility]  [address] [place], [place] [address] Phone: [phone] Fax : [phone]  Primary Care Physician:  [name], MD Primary Gastroenterologist:  Dr. Allen Norris  Pre-Procedure History & Physical: HPI:  Pamela Collier is a 74 y.o. female is here for an colonoscopy.   Past Medical History  Diagnosis Date  . Diabetes mellitus   . 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 Cardiomyopathy, ischemic Jan 2013    EF 35 to 40% [facility])  . Hepatic steatosis     by CT abd pelvis  . Osteoporosis, post-menopausal   . 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 Hypertension   . Hyperlipidemia   . Sarcoidosis   . CHF (congestive heart failure)   . Renal insufficiency   . 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 [facility] (congestive cardiac failure) 08/17/2013  . Pleural effusion 10/25/2012    10/2012 CT chest >> small to moderate R lung effusion>> chylothorax, 100% lymphs 10/2013 thoracentesis> cytology negative, WBC 1471, > 90% "small lymphs" 01/2014 CT chest> near complete resolution of pleural effusion, stable lymphadenopathy 08/2014 CT chest [place] ([facility]): small right sided effusion decreased in size, stable mediastinal lymphadenopathy 1.0cm largest, 3m . 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 Acute respiratory failure 11/21/2014  . Diabetic diarrhea 10/03/2014  . DM type 2, uncontrolled, with renal complications 85/08/6566 . Cerebral infarct 08/17/2013  . Arthritis 08/05/2013  . Depression with anxiety 04/03/2012    . Hyperlipidemia LDL goal <100 02/23/2014    Past Surgical History  Procedure Laterality Date  . Abdominal hysterectomy      at ge 39. secondary to bleeding  . Toe amputation  Sept 2012    Right 2nd toe, [place]  . Ptca  August 2012    Right Posterior tibial artery , [place]  . Coronary angioplasty with stent placement      Prior to Admission medications   Medication Sig Start Date End Date Taking? Authorizing Provider  albuterol (PROVENTIL) (2.5 MG/3ML) 0.083% nebulizer solution Take 3 mLs (2.5 mg total) by nebulization every 6 (six) hours as needed for wheezing or shortness of breath. 12/06/14  Yes TCrecencio Mc MD  alendronate (FOSAMAX) 70 MG tablet Take with a full glass of water on an empty stomach every Sunday  Take with a full glass of water and take no other meds or food for   30 minutes . 12/01/14  Yes TCrecencio Mc MD  ALPRAZolam ([name] 0.5 MG tablet Take 1 tablet (0.5 mg total) by mouth daily as needed for sleep or anxiety. 12/01/14  Yes TCrecencio Mc MD  aspirin 81 MG EC tablet Take 1 tablet (81 mg total) by mouth daily. Swallow whole. 04/18/12  Yes TCrecencio Mc MD  BRILINTA 90 MG TABS tablet Take 1 tablet (90 mg total) by mouth 2 (two) times daily. 12/06/14  Yes TCrecencio Mc MD  COMBIVENT RESPIMAT 20-100 MCG/ACT AERS respimat Inhale 1 puff into the lungs  3 (three) times daily as needed. 12/06/14  Yes [name], MD  fluticasone (FLONASE) 50 MCG/ACT nasal spray Place 2 sprays into both nostrils daily. 12/06/14  Yes [name], MD  furosemide (LASIX) 20 MG tablet Take 1 tablet (20 mg total) by mouth daily. 12/01/14  Yes [name], MD  insulin aspart protamine - aspart (NOVOLOG 70/30 MIX) (70-30) 100 UNIT/ML FlexPen 60 units before breakfast,  60 units before dinner.  NEEDS 15 PENS PER MONTH 12/06/14  Yes [name], MD  isosorbide mononitrate (IMDUR) 30 MG 24 hr tablet Take 1 tablet (30 mg total) by mouth daily. 12/01/14  Yes [name], MD  losartan (COZAAR)  100 MG tablet Take 1 tablet (100 mg total) by mouth daily. 12/01/14  Yes [name], MD  meloxicam (MOBIC) 15 MG tablet Take 1 tablet (15 mg total) by mouth daily. 12/01/14  Yes [name], MD  metoprolol tartrate (LOPRESSOR) 25 MG tablet Take 0.5 tablets (12.5 mg total) by mouth 2 (two) times daily. 12/06/14  Yes [name], MD  nitrofurantoin, macrocrystal-monohydrate, (MACROBID) 100 MG capsule  10/04/14  Yes Historical Provider, MD  PARoxetine (PAXIL) 10 MG tablet Take 1 tablet (10 mg total) by mouth daily. 12/06/14  Yes [name], MD  pregabalin (LYRICA) 100 MG capsule Take 1 capsule (100 mg total) by mouth 3 (three) times daily. 12/06/14  Yes [name], MD  rosuvastatin (CRESTOR) 20 MG tablet Take 1 tablet (20 mg total) by mouth daily. 12/01/14  Yes [name], MD  sodium polystyrene (KAYEXALATE) powder  09/12/14  Yes Historical Provider, MD  traMADol (ULTRAM) 50 MG tablet Take 1 tablet (50 mg total) by mouth every 6 (six) hours as needed for moderate pain. 12/01/14  Yes [name], MD  Vitamin D, Ergocalciferol, (DRISDOL) 50000 UNITS CAPS capsule Take 50,000 Units by mouth every 7 (seven) days.   Yes Historical Provider, MD  Blood Glucose Monitoring Suppl (TRUERESULT BLOOD GLUCOSE) W/DEVICE KIT 1 kit by Does not apply route 3 (three) times daily. 09/27/13   [name], MD  cloNIDine (CATAPRES) 0.1 MG tablet  08/16/14   Historical Provider, MD  glucose blood (TRUETEST TEST) test strip CHECK BLOOD GLUCOSE THREE TIMES DAILY Dx E11.29 11/02/14   [name], MD  INS SYRINGE/NEEDLE 1CC/28G 28G X 1/2" 1 ML MISC 1 each by Does not apply route as needed. To test glucose levels daily. 10/01/12   [name], MD  Insulin Pen Needle (B-D UF III MINI PEN NEEDLES) 31G X 5 MM MISC Use  Three times daily 12/01/14   [name], MD  Lactulose 20 GM/30ML SOLN 30 ml every 4 hours until constipation is relieved Patient not taking: Reported on 11/29/2014 04/03/14   [name], MD    TRUEPLUS LANCETS 30G MISC USE TO CHECK GLUCOSE THREE TIMES DAILY 07/21/14   [name], MD    Allergies as of 11/22/2014 - Review Complete 11/22/2014  Allergen Reaction Noted  . Gabapentin Itching 07/01/2013  . Sulfa antibiotics Itching 12/12/2011    Family History  Problem Relation Age of Onset  . Heart disease Father   . Heart disease Sister   . Heart disease Brother   . Asthma Grandchild   . Breast cancer Maternal Aunt     Social History   Social History  . Marital Status: Married    Spouse Name: N/A  . Number of Children: N/A  .  Years of Education: N/A   Occupational History  . Not on file.   Social History Main Topics  . Smoking status: Never Smoker   . Smokeless tobacco: Never Used  . Alcohol Use: No  . Drug Use: No  . Sexual Activity: Yes   Other Topics Concern  . Not on file   Social History Narrative    Review of Systems: See HPI, otherwise negative ROS  Physical Exam: BP 169/42 mmHg  Pulse 62  Temp(Src) 98.4 F (36.9 C) (Oral)  Resp 17  Ht 5' 3" (1.6 m)  Wt 189 lb (85.73 kg)  BMI 33.49 kg/m2  SpO2 98% General:   Alert,  pleasant and cooperative in NAD Head:  Normocephalic and atraumatic. Neck:  Supple; no masses or thyromegaly. Lungs:  Clear throughout to auscultation.    Heart:  Regular rate and rhythm. Abdomen:  Soft, nontender and nondistended. Normal bowel sounds, without guarding, and without rebound.   Neurologic:  Alert and  oriented x4;  grossly normal neurologically.  Impression/Plan: Pamela Collier is here for an colonoscopy to be performed for diarrhea  Risks, benefits, limitations, and alternatives regarding  colonoscopy have been reviewed with the patient.  Questions have been answered.  All parties agreeable.   Ollen Bowl, MD  01/09/2015, 10:55 AM

## 2015-01-10 ENCOUNTER — Ambulatory Visit: Payer: Medicare Other | Admitting: Internal Medicine

## 2015-01-10 LAB — SURGICAL PATHOLOGY

## 2015-01-11 ENCOUNTER — Other Ambulatory Visit: Payer: Self-pay | Admitting: *Deleted

## 2015-01-11 ENCOUNTER — Emergency Department: Payer: Medicare Other

## 2015-01-11 ENCOUNTER — Encounter: Payer: Self-pay | Admitting: Emergency Medicine

## 2015-01-11 ENCOUNTER — Encounter: Payer: Self-pay | Admitting: Gastroenterology

## 2015-01-11 ENCOUNTER — Encounter: Payer: Self-pay | Admitting: Internal Medicine

## 2015-01-11 ENCOUNTER — Other Ambulatory Visit: Payer: Self-pay

## 2015-01-11 ENCOUNTER — Ambulatory Visit (INDEPENDENT_AMBULATORY_CARE_PROVIDER_SITE_OTHER): Payer: Medicare Other | Admitting: Internal Medicine

## 2015-01-11 ENCOUNTER — Emergency Department
Admission: EM | Admit: 2015-01-11 | Discharge: 2015-01-11 | Disposition: A | Discharge status: Home / Self Care | Payer: Medicare Other | Attending: Emergency Medicine | Admitting: Emergency Medicine

## 2015-01-11 ENCOUNTER — Telehealth: Payer: Self-pay | Admitting: *Deleted

## 2015-01-11 VITALS — BP 174/64 | HR 52 | Temp 98.2°F | Resp 18

## 2015-01-11 DIAGNOSIS — J90 Pleural effusion, not elsewhere classified: Secondary | ICD-10-CM | POA: Diagnosis not present

## 2015-01-11 DIAGNOSIS — R197 Diarrhea, unspecified: Secondary | ICD-10-CM | POA: Insufficient documentation

## 2015-01-11 DIAGNOSIS — E15 Nondiabetic hypoglycemic coma: Secondary | ICD-10-CM | POA: Diagnosis not present

## 2015-01-11 DIAGNOSIS — R200 Anesthesia of skin: Secondary | ICD-10-CM | POA: Diagnosis not present

## 2015-01-11 DIAGNOSIS — E1129 Type 2 diabetes mellitus with other diabetic kidney complication: Secondary | ICD-10-CM | POA: Insufficient documentation

## 2015-01-11 DIAGNOSIS — E1169 Type 2 diabetes mellitus with other specified complication: Secondary | ICD-10-CM | POA: Diagnosis not present

## 2015-01-11 DIAGNOSIS — I10 Essential (primary) hypertension: Secondary | ICD-10-CM | POA: Diagnosis not present

## 2015-01-11 DIAGNOSIS — R0602 Shortness of breath: Secondary | ICD-10-CM

## 2015-01-11 DIAGNOSIS — I255 Ischemic cardiomyopathy: Secondary | ICD-10-CM | POA: Diagnosis not present

## 2015-01-11 DIAGNOSIS — I739 Peripheral vascular disease, unspecified: Secondary | ICD-10-CM | POA: Diagnosis not present

## 2015-01-11 DIAGNOSIS — J9811 Atelectasis: Secondary | ICD-10-CM | POA: Diagnosis not present

## 2015-01-11 DIAGNOSIS — M6281 Muscle weakness (generalized): Secondary | ICD-10-CM | POA: Diagnosis not present

## 2015-01-11 DIAGNOSIS — R51 Headache: Secondary | ICD-10-CM | POA: Diagnosis not present

## 2015-01-11 LAB — FIBRIN DERIVATIVES D-DIMER (ARMC ONLY): Fibrin derivatives D-dimer (ARMC): 1585 — ABNORMAL HIGH (ref 0–499)

## 2015-01-11 LAB — COMPREHENSIVE METABOLIC PANEL
ALBUMIN: 3.7 g/dL (ref 3.5–5.0)
ALT: 14 U/L (ref 14–54)
ANION GAP: 4 — AB (ref 5–15)
AST: 23 U/L (ref 15–41)
Alkaline Phosphatase: 45 U/L (ref 38–126)
BUN: 19 mg/dL (ref 6–20)
CO2: 27 mmol/L (ref 22–32)
Calcium: 9.2 mg/dL (ref 8.9–10.3)
Chloride: 111 mmol/L (ref 101–111)
Creatinine, Ser: 1.14 mg/dL — ABNORMAL HIGH (ref 0.44–1.00)
GFR calc Af Amer: 54 mL/min — ABNORMAL LOW (ref >60)
GFR calc non Af Amer: 46 mL/min — ABNORMAL LOW (ref >60)
GLUCOSE: 92 mg/dL (ref 65–99)
POTASSIUM: 4.1 mmol/L (ref 3.5–5.1)
SODIUM: 142 mmol/L (ref 135–145)
TOTAL PROTEIN: 7.2 g/dL (ref 6.5–8.1)
Total Bilirubin: 0.8 mg/dL (ref 0.3–1.2)

## 2015-01-11 LAB — CBC WITH DIFFERENTIAL/PLATELET
BASOS PCT: 1 %
Basophils Absolute: 0 10*3/uL (ref 0–0.1)
Eosinophils Absolute: 0.1 10*3/uL (ref 0–0.7)
Eosinophils Relative: 3 %
HEMATOCRIT: 33.4 % — AB (ref 35.0–47.0)
HEMOGLOBIN: 11.2 g/dL — AB (ref 12.0–16.0)
LYMPHS ABS: 1.2 10*3/uL (ref 1.0–3.6)
Lymphocytes Relative: 22 %
MCH: 29.5 pg (ref 26.0–34.0)
MCHC: 33.4 g/dL (ref 32.0–36.0)
MCV: 88.4 fL (ref 80.0–100.0)
MONO ABS: 0.8 10*3/uL (ref 0.2–0.9)
MONOS PCT: 14 %
NEUTROS ABS: 3.4 10*3/uL (ref 1.4–6.5)
Neutrophils Relative %: 60 %
Platelets: 237 10*3/uL (ref 150–440)
RBC: 3.78 MIL/uL — ABNORMAL LOW (ref 3.80–5.20)
RDW: 15 % — AB (ref 11.5–14.5)
WBC: 5.6 10*3/uL (ref 3.6–11.0)

## 2015-01-11 LAB — BRAIN NATRIURETIC PEPTIDE: B Natriuretic Peptide: 352 pg/mL — ABNORMAL HIGH (ref 0.0–100.0)

## 2015-01-11 LAB — GLUCOSE, POCT (MANUAL RESULT ENTRY): POC GLUCOSE: 202 mg/dL — AB (ref 70–99)

## 2015-01-11 LAB — TROPONIN I

## 2015-01-11 MED ORDER — IOHEXOL 350 MG/ML SOLN
75.0000 mL | Freq: Once | INTRAVENOUS | Status: AC | PRN
  Administered 2015-01-11: 75 mL via INTRAVENOUS

## 2015-01-11 NOTE — ED Notes (Signed)
Patient transported to X-ray 

## 2015-01-11 NOTE — Assessment & Plan Note (Signed)
Uncontrolled today,  Patient  Appears to be taking both olmesartan and losartan  Per review  Of medications today,  She states that the olmesartan was restarted by the [place] doctor that treated her in march,  But was not taking it when I saw her last month,  She  has a Home health RN who needs to be more "hands on" with her medications.

## 2015-01-11 NOTE — Telephone Encounter (Signed)
-----   Message from Juanito Doom, MD sent at 01/11/2015  3:21 PM EDT ----- Triage, Please schedule a f/u visit in [facility] for [name].  She had a pleural effusion seen today after a colonoscopy, is a recurrent issue for her. Thanks B  Deep, I assume she will be yours. 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 Charlann Lange... FYI

## 2015-01-11 NOTE — ED Notes (Signed)
Pt stated that she felt like her BS was dropping. CBG completed and noted to be 60. MD aware. Pt given graham crackers and juice. Will continue to monitor pt.

## 2015-01-11 NOTE — Patient Outreach (Signed)
[facility]) Care Management  01/11/2015  VELETA GUCK 1941/01/28 [id number]   Phone call to patient to schedule home visit to complete her Advanced Directive.  Phone just rang, no message able to be left.  This social worker will attempt to call again on 01/12/15.    [name] [facility] Care Management [phone]

## 2015-01-11 NOTE — ED Notes (Signed)
EMS states that pt was at PCP for follow up regarding her diabetes. Pt became SOB upon arrival to office. Pt states that when she got to desk to check in she felt like she couldn't breath. Pt states that her breathing has improved but if she stands up and moves she feels SOB. Pt denies pain in chest at this time. Pt is complaining of headache that started the same time as the SOB.

## 2015-01-11 NOTE — Progress Notes (Signed)
Pre-visit discussion using our clinic review tool. No additional management support is needed unless otherwise documented below in the visit note.  

## 2015-01-11 NOTE — Progress Notes (Signed)
Subjective:  Patient ID: Pamela Collier, female    DOB: Apr 20, 1941  Age: 74 y.o. MRN: [id number]  CC: The primary encounter diagnosis was SOB (shortness of breath). Diagnoses of Shortness of breath and Essential hypertension were also pertinent to this visit.  HPI [name] presents for follow up on DM type 2, CAD with CHF, and COPD.  Patient underwent colonoscopy on August 30th by Lucilla Lame, and has been having increased sputum since then bt did not become acutely short of breath until she entered our office.  Patient told our nurse that she stopped taking benicar instead of the Brilinta for the colonoscopy,   But after 10 minutes of searching in the chart ,  I showed her the the letter to Dr Clayborn Bigness from Dr Allen Norris  which states Brilinta was to be stopped,  On August 25th . And resumed it this morning.  Patient is now stating that it  was the brilinta that was stopped. She is very confused about her medications and did not bring them with her.    She  not seen any bleeding but feels bloated in the abdomen and has been having intermittent back pain.  Also getting headaches,  Which starts in the morning AFTER she takes her meds at 9 or 9:30. Headache has been occurring on off across THE [place]    And is accompanied by itching of rmas legs or back,  Thinks both are due since she started taking a generic medication    Has been taking Benicar and losartan, apparently  Since her hospitalization in [place].   Has  Home health RN that is supposed to be helping her with medications  DM: blood sugars are at goal in the am but elevated in the evening.  Eats fast food maybe once a week,  Last night  Ate a chili dog andfrench  fries last night.    Outpatient Prescriptions Prior to Visit  Medication Sig Dispense Refill  . albuterol (PROVENTIL) (2.5 MG/3ML) 0.083% nebulizer solution Take 3 mLs (2.5 mg total) by nebulization every 6 (six) hours as needed for wheezing or shortness of breath. 150  mL 1  . alendronate (FOSAMAX) 70 MG tablet Take with a full glass of water on an empty stomach every Sunday  Take with a full glass of water and take no other meds or food for   30 minutes . 12 tablet 1  . ALPRAZolam (XANAX) 0.5 MG tablet Take 1 tablet (0.5 mg total) by mouth daily as needed for sleep or anxiety. 90 tablet 0  . aspirin 81 MG EC tablet Take 1 tablet (81 mg total) by mouth daily. Swallow whole. 30 tablet 12  . Blood Glucose Monitoring Suppl (TRUERESULT BLOOD GLUCOSE) W/DEVICE KIT 1 kit by Does not apply route 3 (three) times daily. 1 each 0  . BRILINTA 90 MG TABS tablet Take 1 tablet (90 mg total) by mouth 2 (two) times daily. 180 tablet 2  . cloNIDine (CATAPRES) 0.1 MG tablet     . COMBIVENT RESPIMAT 20-100 MCG/ACT AERS respimat Inhale 1 puff into the lungs 3 (three) times daily as needed. 2 Inhaler 3  . fluticasone (FLONASE) 50 MCG/ACT nasal spray Place 2 sprays into both nostrils daily. 16 g 2  . furosemide (LASIX) 20 MG tablet Take 1 tablet (20 mg total) by mouth daily. 90 tablet 1  . glucose blood (TRUETEST TEST) test strip CHECK BLOOD GLUCOSE THREE TIMES DAILY Dx E11.29 100 each 11  .  INS SYRINGE/NEEDLE 1CC/28G 28G X 1/2" 1 ML MISC 1 each by Does not apply route as needed. To test glucose levels daily.    . insulin aspart protamine - aspart (NOVOLOG 70/30 MIX) (70-30) 100 UNIT/ML FlexPen 60 units before breakfast,  60 units before dinner.  NEEDS 15 PENS PER MONTH 105 mL 2  . Insulin Pen Needle (B-D UF III MINI PEN NEEDLES) 31G X 5 MM MISC Use  Three times daily 300 each 3  . isosorbide mononitrate (IMDUR) 30 MG 24 hr tablet Take 1 tablet (30 mg total) by mouth daily. 90 tablet 1  . Lactulose 20 GM/30ML SOLN 30 ml every 4 hours until constipation is relieved 236 mL 3  . losartan (COZAAR) 100 MG tablet Take 1 tablet (100 mg total) by mouth daily. 90 tablet 2  . meloxicam (MOBIC) 15 MG tablet Take 1 tablet (15 mg total) by mouth daily. 90 tablet 1  . metoprolol tartrate  (LOPRESSOR) 25 MG tablet Take 0.5 tablets (12.5 mg total) by mouth 2 (two) times daily. 180 tablet 1  . nitrofurantoin, macrocrystal-monohydrate, (MACROBID) 100 MG capsule     . PARoxetine (PAXIL) 10 MG tablet Take 1 tablet (10 mg total) by mouth daily. 90 tablet 1  . pregabalin (LYRICA) 100 MG capsule Take 1 capsule (100 mg total) by mouth 3 (three) times daily. 270 capsule 3  . rosuvastatin (CRESTOR) 20 MG tablet Take 1 tablet (20 mg total) by mouth daily. 90 tablet 1  . sodium polystyrene (KAYEXALATE) powder     . traMADol (ULTRAM) 50 MG tablet Take 1 tablet (50 mg total) by mouth every 6 (six) hours as needed for moderate pain. 120 tablet 3  . TRUEPLUS LANCETS 30G MISC USE TO CHECK GLUCOSE THREE TIMES DAILY 400 each 0  . Vitamin D, Ergocalciferol, (DRISDOL) 50000 UNITS CAPS capsule Take 50,000 Units by mouth every 7 (seven) days.     Facility-Administered Medications Prior to Visit  Medication Dose Route Frequency Provider Last Rate Last Dose  . albuterol (PROVENTIL) (5 MG/ML) 0.5% nebulizer solution 2.5 mg  2.5 mg Nebulization Once [name], MD        Review of Systems;  Patient denies  fevers, malaise, unintentional weight loss, skin rash, eye pain, sinus congestion and sinus pain, sore throat, dysphagia,  hemoptysis , cough, dyspnea, wheezing, chest pain, palpitations, , edema, nausea, melena, diarrhea, constipation, flank pain, dysuria, hematuria, urinary  Frequency, nocturia, numbness, tingling, seizures,  Focal weakness, Loss of consciousness,  Tremor, insomnia, depression, anxiety, and suicidal ideation.      Objective:  BP 174/64 mmHg  Pulse 52  Temp(Src) 98.2 F (36.8 C) (Oral)  Resp 18  SpO2 98%  BP Readings from Last 3 Encounters:  01/11/15 174/64  01/09/15 181/64  12/18/14 138/62    Wt Readings from Last 3 Encounters:  01/09/15 189 lb (85.73 kg)  12/18/14 190 lb (86.183 kg)  11/22/14 192 lb (87.091 kg)    General appearance:  In distress,  Unable to  speak in full sentences Ears: normal TM's and external ear canals both ears Throat: lips, mucosa, and tongue normal; teeth and gums normal Neck: no adenopathy, no carotid bruit, supple, symmetrical, trachea midline and thyroid not enlarged, symmetric, no tenderness/mass/nodules Back: symmetric, no curvature. ROM normal. No CVA tenderness. Lungs: diminished BS bilaterally, no rales.wheezing Heart: regular rate and rhythm, S1, S2 normal, no murmur, click, rub or gallop Abdomen: soft, non-tender; bowel sounds normal; no masses,  no organomegaly Pulses: 2+ and symmetric  Skin: Skin color, texture, turgor normal. No rashes or lesions Lymph nodes: Cervical, supraclavicular, and axillary nodes normal.  Lab Results  Component Value Date   HGBA1C 8.1* 10/03/2014   HGBA1C 8.4* 06/29/2014   HGBA1C 9.1* 02/23/2014    Lab Results  Component Value Date   CREATININE 1.54* 11/02/2014   CREATININE 1.10 10/03/2014   CREATININE 1.26* 06/29/2014    Lab Results  Component Value Date   WBC 6.4 11/02/2014   HGB 11.1* 11/02/2014   HCT 34.8* 11/02/2014   PLT 277 11/02/2014   GLUCOSE 121* 11/02/2014   CHOL 159 02/23/2014   TRIG 165.0* 02/23/2014   HDL 27.90* 02/23/2014   LDLDIRECT 73.0 10/03/2014   LDLCALC 98 02/23/2014   ALT 13 10/03/2014   AST 16 10/03/2014   NA 137 11/02/2014   K 5.1 11/02/2014   CL 102 11/02/2014   CREATININE 1.54* 11/02/2014   BUN 39* 11/02/2014   CO2 27 11/02/2014   TSH 1.16 07/01/2013   INR 1.03 04/07/2014   HGBA1C 8.1* 10/03/2014   MICROALBUR 61.1* 10/03/2014    No results found.  Assessment & Plan:   Problem List Items Addressed This Visit      Unprioritized   Essential hypertension    Uncontrolled today,  Patient  Appears to be taking both olmesartan and losartan  Per review  Of medications today,  She states that the olmesartan was restarted by the [place] doctor that treated her in march,  But was not taking it when I saw her last month,  She  has a  Home health RN who needs to be more "hands on" with her medications.      Relevant Medications   olmesartan (BENICAR) 20 MG tablet   Shortness of breath    Without hypoxia, wheezing  or chest pain ,  But given her history of  2 vessel CAD with recent stenting and recent suspension of Brilinta for colonoscopy procedure, I am concerned about unstable angina due to stent closure  Vs diastolic heart failure due to uncontrolled hypertension as the cause.  Sending to ER       SOB (shortness of breath) - Primary   Relevant Orders   EKG 12-Lead (Completed)     A total of 40 minutes was spent with patient more than half of which was spent in counseling patient on the above mentioned issues , reviewing and explaining recent labs and imaging studies done, and coordination of care. I am having Ms. Wendi Snipes maintain her aspirin, INS SYRINGE/NEEDLE 1CC/28G, TRUERESULT BLOOD GLUCOSE, Lactulose, TRUEPLUS LANCETS 30G, Vitamin D (Ergocalciferol), glucose blood, cloNIDine, nitrofurantoin (macrocrystal-monohydrate), sodium polystyrene, alendronate, ALPRAZolam, furosemide, Insulin Pen Needle, isosorbide mononitrate, losartan, meloxicam, rosuvastatin, traMADol, insulin aspart protamine - aspart, BRILINTA, fluticasone, pregabalin, metoprolol tartrate, PARoxetine, COMBIVENT RESPIMAT, albuterol, and olmesartan.  Meds ordered this encounter  Medications  . olmesartan (BENICAR) 20 MG tablet    Sig: Take 20 mg by mouth daily. 0.5 tablets twice daily    There are no discontinued medications.  Follow-up: No Follow-up on file.   [name], MD

## 2015-01-11 NOTE — Telephone Encounter (Signed)
Attempt to call pt. No answer, no option to leave a message. Will route to Triage to follow up on.

## 2015-01-11 NOTE — Discharge Instructions (Signed)
Pleural Effusion °The lining covering your lungs and the inside of your chest is called the pleura. Usually, the space between the two pleura contains no air and only a thin layer of fluid. A pleural effusion is an abnormal buildup of fluid in the pleural space. °Fluid gathers when there is increased pressure in the lung vessels. This forces fluids out of the lungs and into the pleural space. Vessels may also leak fluids when there are infections, such as pneumonia, or other causes of soreness and redness (inflammation). Fluids leak into the lungs when protein in the blood is low or when certain vessels (lymphatics) are blocked. °Finding a pleural effusion is important because it is usually caused by another disease. In order to treat a pleural effusion, your health care provider needs to find its cause. If left untreated, a large amount of fluid can build up and cause collapse of the lung. °CAUSES  °· Heart failure. °· Infections (pneumonia, tuberculosis), pulmonary embolism, pulmonary infarction. °· Cancer (primary lung and metastatic), asbestosis. °· Liver failure (cirrhosis). °· Nephrotic syndrome, peritoneal dialysis, kidney problems (uremia). °· Collagen vascular disease (systemic lupus erythematosus, rheumatoid arthritis). °· Injury (trauma) to the chest or rupture of the digestive tube (esophagus). °· Material in the chest or pleural space (hemothorax, chylothorax). °· Pancreatitis. °· Surgery. °· Drug reactions. °SYMPTOMS  °A pleural effusion can decrease the amount of space available for breathing and make you short of breath. The fluid can become infected, which may cause pain and fever. Often, the pain is worse when taking a deep breath. The underlying disease (heart failure, pneumonia, blood clot, tuberculosis, cancer) may also cause symptoms. °DIAGNOSIS  °· Your health care provider can usually tell what is wrong by talking to you (taking a history), doing an exam, and taking a routine X-ray. If the  X-ray shows fluid in your chest, often fluid is removed from your chest with a needle for testing (diagnostic thoracentesis). °· Sometimes, more specialized X-rays may be needed. °· Sometimes, a small piece of tissue is removed and examined by a specialist (biopsy). °TREATMENT  °Treatment varies based on what caused the pleural effusion. Treatments include: °· Removing as much fluid as possible using a needle (thoracentesis) to improve the cough and shortness of breath. This is a simple procedure that can be done at bedside. The risks are bleeding, infection, collapse of a lung, or low blood pressure. °· Placing a tube in the chest to drain the effusion (tube thoracostomy). This is often used when there is an infection in the fluid. This is a simple procedure that can often be done at bedside or in a clinic. The procedure may be painful. The risks are the same as using a needle to drain the fluid. The chest tube usually remains for a few days and is connected to suction to improve fluid drainage. After placement, the tube usually does not cause much discomfort. °· Surgical removal of fibrous debris in and around the pleural space (decortication). This may be done with a flexible telescope (thoracoscope) through a small or large cut (incision). This is helpful for patients who have fibrosis or scar tissue that prevents complete lung expansion. The risks are infection, blood loss, and side effects from general anesthesia. °· Sometimes, a procedure called pleurodesis is done. A chest tube is placed and the fluid is drained. Next, an agent (tetracycline, talc powder) is added to the pleural space. This causes the lung and chest wall to stick together (adhesion). This leaves no   potential space for fluid to build up. The risks include infection, blood loss, and side effects from general anesthesia. °· If the effusion is caused by infection, it may be treated with antibiotics and may improve without draining. °HOME CARE  INSTRUCTIONS  °· Take any medicines exactly as prescribed. °· Follow up with your health care provider as directed. °· Monitor your exercise capacity (the amount of walking you can do before you get short of breath). °· Do not use any tobacco products including cigarettes, chewing tobacco, or electronic cigarettes. °SEEK MEDICAL CARE IF:  °· Your exercise capacity seems to get worse or does not improve with time. °· You do not recover from your illness. °· You have drainage, redness, swelling, or pain at any incision or puncture sites. °SEEK IMMEDIATE MEDICAL CARE IF:  °· Shortness of breath or chest pain develops or gets worse. °· You have a fever. °· You develop a new cough, especially if the mucus (phlegm) is discolored. °MAKE SURE YOU:  °· Understand these instructions. °· Will watch your condition. °· Will get help right away if you are not doing well or get worse. °Document Released: 04/28/2005 Document Revised: 09/12/2013 Document Reviewed: 12/18/2006 °ExitCare® Patient Information ©2015 [employer]. This information is not intended to replace advice given to you by your health care provider. Make sure you discuss any questions you have with your health care provider. ° °

## 2015-01-11 NOTE — ED Provider Notes (Signed)
[facility] Emergency Department Provider Note     Time seen: ----------------------------------------- 11:55 AM on 01/11/2015 -----------------------------------------    I have reviewed the triage vital signs and the nursing notes.   HISTORY  Chief Complaint Shortness of Breath    HPI Pamela Collier is a 74 y.o. female who presents to ER for shortness of breath. Patient states she was at her primary care doctor's office for follow-up regarding her diabetes. Patient became short of breath on arrival to the office. Patient states when she got to the dentist check and she thought she couldn't breathe. Patient states that her breathing is improved but if she stands up moving around she feels a lot more short of breath. She is also complaining of some headache, states that she been more short of breath since she had a colonoscopy 2 days ago. Denies any fever or chills or other complaints.   Past Medical History  Diagnosis Date  . Diabetes mellitus   . 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 Cardiomyopathy, ischemic Jan 2013    EF 35 to 40% [facility])  . Hepatic steatosis     by CT abd pelvis  . Osteoporosis, post-menopausal   . 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 Hypertension   . Hyperlipidemia   . Sarcoidosis   . CHF (congestive heart failure)   . Renal insufficiency   . 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 CCF (congestive cardiac failure) 08/17/2013  . Pleural effusion 10/25/2012    10/2012 CT chest >> small to moderate R lung effusion>> chylothorax, 100% lymphs 10/2013 thoracentesis> cytology negative, WBC 1471, > 90% "small lymphs" 01/2014 CT chest> near complete resolution of pleural effusion, stable lymphadenopathy 08/2014 CT chest [place] ([facility]): small right sided effusion decreased in size, stable mediastinal lymphadenopathy 1.0cm largest, 81m  . 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 Acute respiratory failure 11/21/2014  . Diabetic diarrhea 10/03/2014  . DM type 2, uncontrolled, with renal complications [id number]  . Cerebral infarct 08/17/2013  . Arthritis 08/05/2013  . 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 Hyperlipidemia LDL goal <100 02/23/2014    Patient Active Problem List   Diagnosis Date Noted  . Shortness of breath 01/11/2015  . Diarrhea in adult patient   . Benign neoplasm of sigmoid colon   . Acute respiratory failure 11/21/2014  . UTI (urinary tract infection) 10/04/2014  . Diabetic diarrhea 10/03/2014  . Low back pain with radiation 04/04/2014  . Abdominal pain, suprapubic 04/04/2014  . Essential hypertension 04/04/2014  . Hyperlipidemia LDL goal <100 02/23/2014  . Long-term use of high-risk medication 02/23/2014  . OP (osteoporosis) 08/17/2013  . Dermatophytic onychia 08/17/2013  . Angina pectoris 08/17/2013  . CCF (congestive cardiac failure) 08/17/2013  . Arteriosclerosis of coronary artery 08/17/2013  . Cerebral infarct 08/17/2013  . Bony exostosis 08/17/2013  . Adiposity 08/17/2013  . Arthritis 08/05/2013  . Unsteady gait 03/24/2013  . History of colonic polyps 02/20/2013  . Need for prophylactic  vaccination and inoculation against influenza 02/03/2013  . 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 Dysphagia 12/07/2012  . Pleural effusion 10/25/2012  . Cough 10/25/2012  . Hospital discharge follow-up 09/17/2012  . SOB (shortness of breath) 05/27/2012  . Pulmonary nodule seen on imaging study 05/20/2012  . 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 Cardiomyopathy, ischemic   . Hepatic steatosis   . Osteoporosis, post-menopausal   . 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 DM type 2, uncontrolled, with renal complications [id number]  . Double vessel coronary artery disease 12/14/2011    Past Surgical History  Procedure Laterality Date  . Abdominal hysterectomy      at ge 39. secondary to bleeding  . Toe amputation  Sept 2012    Right 2nd toe, [place]  . Ptca  August 2012    Right Posterior tibial artery , [place]  . Coronary angioplasty with stent placement    . Colonoscopy with propofol N/A 01/09/2015    Procedure: COLONOSCOPY WITH PROPOFOL;  Surgeon: Lucilla Lame, MD;  Location: [facility] ENDOSCOPY;  Service: Endoscopy;  Laterality: N/A;    Allergies Gabapentin and Sulfa antibiotics  Social History Social History  Substance Use Topics  . Smoking status: Never Smoker   . Smokeless tobacco: Never Used  . Alcohol Use: No    Review of Systems Constitutional: Negative for fever. Eyes: Negative for visual changes. ENT: Negative for sore throat. Cardiovascular: Negative for chest pain. Respiratory: Positive for shortness of breath Gastrointestinal: Negative for abdominal pain, vomiting and diarrhea. Genitourinary: Negative for dysuria. Musculoskeletal: Negative for back pain. Skin: Negative for rash. Neurological: Positive for headache, focal weakness or numbness.  10-point ROS otherwise negative.  ____________________________________________   PHYSICAL EXAM:  VITAL SIGNS: ED Triage Vitals  Enc Vitals Group     BP 01/11/15 1142 174/70 mmHg     Pulse Rate 01/11/15 1142 52     Resp 01/11/15 1142 18     Temp 01/11/15 1142 98.8 F (37.1 C)     Temp Source 01/11/15 1142 Oral     SpO2 01/11/15 1138 98 %     Weight 01/11/15 1142 189 lb (85.73 kg)     Height 01/11/15 1142 5\' 3"  (1.6 m)     Head Cir --      Peak Flow --      Pain Score 01/11/15  1143 6     Pain Loc --      Pain Edu? --      Excl. in [place]? --     Constitutional: Alert and oriented. Mild distress Eyes: Conjunctivae are normal. PERRL. Normal extraocular movements. ENT   Head: Normocephalic and atraumatic.   Nose: No congestion/rhinnorhea.   Mouth/Throat: Mucous membranes are moist.   Neck: No stridor. Cardiovascular: Normal rate, regular rhythm. Normal and symmetric distal pulses are present in all extremities. No murmurs, rubs, or gallops. Respiratory: Normal respiratory effort without tachypnea nor retractions. Breath sounds are clear and equal bilaterally. No wheezes/rales/rhonchi. Gastrointestinal: Soft and nontender. No distention. No abdominal bruits.  Musculoskeletal: Nontender with normal range of motion in all extremities. No joint effusions.  No lower extremity tenderness nor edema. Neurologic:  Normal speech and language. No gross focal neurologic deficits are appreciated. Speech is normal. No gait instability. Skin:  Skin is warm, dry and intact. No rash noted. Psychiatric: Mood and affect are normal. Speech and behavior are normal. Patient exhibits appropriate insight and judgment. ____________________________________________  EKG: Interpreted  by me. White QRS rhythm, rate is 53 bpm, left axis deviation and right bundle branch block. LVH.  ____________________________________________  ED COURSE:  Pertinent labs & imaging results that were available during my care of the patient were reviewed by me and considered in my medical decision making (see chart for details). Patient with dyspnea, uncertain etiology. We'll check for heart failure, PE, pneumonia possibly aspiration secondary to colonoscopy.   ____________________________________________    LABS (pertinent positives/negatives)  Labs Reviewed  CBC WITH DIFFERENTIAL/PLATELET  BRAIN NATRIURETIC PEPTIDE  TROPONIN I  COMPREHENSIVE METABOLIC PANEL  FIBRIN DERIVATIVES D-DIMER ([facility]  ONLY)    RADIOLOGY Images were viewed by me  Chest x-ray/CTA IMPRESSION: Negative for pulmonary embolism.  Small-to-moderate sized right pleural effusion. Volume loss in the right lower lobe with ground-glass densities probably associated with atelectasis.  Small nodular structures in the right lower lobe. These nodules may be chronic but at least one nodule has demonstrated interval growth, now measuring 5 mm. These small nodules could be postinflammatory but indeterminate. If the patient is at high risk for bronchogenic carcinoma, follow-up chest CT at 6-12 months is recommended. If the patient is at low risk for bronchogenic carcinoma, follow-up chest CT at 12 months is recommended. This recommendation follows the consensus statement: Guidelines for Management of Small Pulmonary Nodules Detected on CT Scans: A Statement from the [employer] as published in Radiology 2005;237:395-400.  Prominent lymph nodes in the upper abdomen and posterior mediastinum. These lymph nodes are probably related to history of sarcoidosis.  Stable 1.3 cm nodule in the medial right upper lobe.  ____________________________________________  FINAL ASSESSMENT AND PLAN  Dyspnea, pleural effusion  Plan: Patient with labs and imaging as dictated above. Dyspnea likely secondary to pleural effusion that has developed. I have discussed the case with Dr. Lake Bells who has agreed to help her get outpatient follow-up. No indications for inpatient hospitalization and thoracentesis. This can be done as an outpatient. Patient is instructed of same.   Earleen Newport, MD   Earleen Newport, MD 01/11/15 [phone]

## 2015-01-11 NOTE — Assessment & Plan Note (Addendum)
Without hypoxia, wheezing  or chest pain ,  But given her history of  2 vessel CAD with recent stenting and recent suspension of Brilinta for colonoscopy procedure, I am concerned about unstable angina due to stent closure  Vs diastolic heart failure due to uncontrolled hypertension as the cause.  Sending to ER

## 2015-01-11 NOTE — Patient Instructions (Addendum)
You should not be taking benicar AND losartan  At the same time

## 2015-01-11 NOTE — Addendum Note (Signed)
Addended by: Nanci Pina on: 01/11/2015 01:56 PM   Modules accepted: Orders

## 2015-01-12 ENCOUNTER — Other Ambulatory Visit: Payer: Self-pay | Admitting: Internal Medicine

## 2015-01-12 ENCOUNTER — Ambulatory Visit: Payer: Medicare Other | Admitting: Internal Medicine

## 2015-01-12 ENCOUNTER — Other Ambulatory Visit: Payer: Self-pay | Admitting: *Deleted

## 2015-01-12 ENCOUNTER — Ambulatory Visit
Admission: RE | Admit: 2015-01-12 | Discharge: 2015-01-12 | Disposition: A | Discharge status: Home / Self Care | Payer: Medicare Other | Source: Ambulatory Visit | Attending: Internal Medicine | Admitting: Internal Medicine

## 2015-01-12 ENCOUNTER — Telehealth: Payer: Self-pay | Admitting: *Deleted

## 2015-01-12 DIAGNOSIS — J90 Pleural effusion, not elsewhere classified: Secondary | ICD-10-CM | POA: Diagnosis not present

## 2015-01-12 NOTE — Patient Outreach (Signed)
RNCM contacted pt this morning after noting she had been seen in the ED yesterday and was released to have plural effusions cared for outpatient. RNCM spoke to pt who voiced she was getting ready to call the MD who would be "doing the procedure on her lungs". Pt stated she had a very hard night with lots of SOB and barely any sleep.   Pt stating she called Dr. Leane Platt office as printed on her AVS from [facility] ED and was unable to get an appointment to be seen until 9/21. RNCM reviewed all notes in EPIC and discovered pt was to be seen by Dr. Lake Bells with [facility] in [place] according to ED doctors progress note not Raul Del.   RNCM instructed pt to call [facility] Royal[facility] office instead of [facility] and confirmed pt had the number.   Pt contacted RNCM back and said she was going to be seen today in [place] by Dr Mortimer Fries who is in practice with Dr. Lake Bells. Pt thankful for RNCM's assistance.  RNCM spoke to pt briefly about a possible medication discrepancy in which her PCP was concerned about in regards to [facility] and her losartan. Pt stated she remembered her MD talking about it but stated " I felt so badly yesterday that I could not make sense of what she was saying. My doctor was talking about some medicine list from when I had my colonoscopy. She stated I was on two blood pressure medications that were the same. I told her you and [facility] Ormond help me with my medications. I could not really understand what she was saying and I did not have my home medications or my home list with me to show her."  RNCM assured pt she had not observed both losartan and olmnesartan in her medication container nor on her home med list, but would like to look at bottles and medications in person to make sure. At this time it is unclear to [facility] why PCP felt pt had been taking both losartan and olmnesartan, but will be in contact with PCP to discuss.   RNCM called pt's [place] and spoke to [place] who confirmed pt has not  had Benicar/olmnesartan filled since 02/07/14. RNCM also contacted Harlow Asa who has been in close assistance with pt's use of [facility] mail order and there has been no Benicar ordered through this pharmacy since her involvement starting in July.   RNCM also talked with pt who is in agreement for RNCM to come to her home this am and recheck that she is taking the correct medications according to PCP's plan.    Plan: RNCM will make acute home visit to visually inspect medications and bottles like at each home visit.  RNCM will contact PCP to discuss any issues involving medication discrepancies.  Rutherford Limerick RN, BSN  [facility] Care Management [phone])            Rutherford Limerick RN, BSN  [facility] Care Management [phone])

## 2015-01-12 NOTE — Telephone Encounter (Signed)
Spoke with pt, advised that BQ is no longer in our [place] office, scheduled for 1:30 with Kasa in [place].  Pt aware of appt place/time, verified also with husband.  Nothing further needed.

## 2015-01-12 NOTE — Telephone Encounter (Signed)
Per Dr. Mortimer Fries, I will call pt and send for US guided thoracentesis. Order placed. Pt to have [place] and f/u with Dr. Mortimer Fries. Spoke with pt and informed of [place] needed and the f/u.

## 2015-01-12 NOTE — Telephone Encounter (Signed)
Pt would like to have fluid drawn off her back today. She is in a lot of pain. i offered an appt to see KASA this afternoon, but pt wants to see BQ and does not understand why another dr would be picked.  Please call at [phone]

## 2015-01-12 NOTE — Patient Outreach (Signed)
RNCM made acute home visit to ensure medications were correct as previously documented by [facility]. All medications visualized on the label and inside the bottle as done on previous visits. Medication review done with pt, no duplication of b/p pills observed. Not even any empty bottles of benicar/olmnesartan observed. Pt assured RNCM she did not have any other medications in the home that she was taking. Medications discussed with patient and patient with a clear understanding of purpose. RNCM attempted to call PCP while in pt's home and was sent to voicemail. Also sent PCP inbasket to clarify which b/p medication she would like pt to be on Losartan 100mg  or changed to Benicar 20 mg BID per recent note (9/1) by provider.   RNCM and pt discussed the issue of why her PCP thought she was on two of the same type of b/p medications. As RNCM talked with pt, she again stated she was feeling so poorly at her MD visit that she was getting her medications and their names confused. Pt also stated, " I think I got Benicar and Brillenta mixed up because I used to be on Benicar and now I am on Brillenta." RNCM assured pt she was and has been taking the correct medication and there were no duplicates present now nor have been since [facility] started seeing pt.  RNCM again explained the purposes and differences between these medications. Pt verbalized understanding and denies further questions at this time.   While RNCM was in the home Misty from [name] care called pt. Pt handed RNCM phone to receive instructions. Misty advised RNCM pt was to go to [facility] registration desk to be directed to ultrasound for an ultrasound guided thoracentesis. Misty advised that pt is to come to the pulmonary MD office after procedure if she continues to not feel well after fluid is removed from plural space. Misty also advised that if pt was feeling much better after procedure she could just schedule a f/u with pulmonologist next week.   RNCM  explained procedure to pt as she was unclear why an ultra sound needed to be done when fluid had already been detected in the ED. Pt verbalized understanding that ultra sound was to guide radiologist in removing fluid.    Plan: RNCM will explain procedure and instructions to husband.  RNCM will continue to attempt to contact T. Tullo for clarification.  Rutherford Limerick RN, BSN  [facility] Care Management [phone])

## 2015-01-13 LAB — GLUCOSE, CAPILLARY: GLUCOSE-CAPILLARY: 60 mg/dL — AB (ref 65–99)

## 2015-01-16 ENCOUNTER — Telehealth: Payer: Self-pay | Admitting: *Deleted

## 2015-01-16 NOTE — Telephone Encounter (Signed)
Which caregiver is now getting involved?  Patient is NOT to be taking Benicar.  She should be taking Losartan , Patient was confused last week,  Said she WAS taking Benicar,  I have already discussed with  [facility] who is  supposed to be helping patient her medications

## 2015-01-16 NOTE — Telephone Encounter (Signed)
pts caregiver called requesting clarification on BP medication.  On 9.1.16 OV AVS it states Benicar 20mg  was prescribed.  It also states pt is not to take both Benicar and Losartin together.  Further states Benicar has not been sent to pharmacy.  Please advise which medication pt is to be taking.  [phone]

## 2015-01-16 NOTE — Telephone Encounter (Signed)
Caryl Pina is from [facility].

## 2015-01-17 ENCOUNTER — Other Ambulatory Visit: Payer: Self-pay | Admitting: Pharmacist

## 2015-01-17 NOTE — Patient Outreach (Signed)
Received message from Nurse Care Manager Janci Minor last week while I was out of the office that there had been some confusion about the patient's blood pressure medications. Note per [facility], patient's PCP, Dr. Derrel Nip noted on 01/11/15 that patient was on both Benicar and losartan therapies, which would be a therapeutic duplication. Let Janci Minor know that during the time that I have followed this patient, since 11/22/14, I have not observed patient to be on Benicar, only losartan. At each visit with Pamela Collier, I have had the patient show me each of her medication bottles, reviewed the contents and discussed with patient the indication for each. Note that on 01/12/15, Janci Minor also followed up with the patient in person to confirm that she was not taking Benicar. Per Janci Minor, RNCM has also followed up with Dr. Derrel Nip to let her know that patient is not on Benicar, this therapeutic duplication is not present. Janci Minor reports that she also verified with Dr. Derrel Nip that Benicar has not been sent by Dr. Lupita Dawn office to patient's pharmacy.  Today called to follow up with Pamela Collier to make sure that she does not have any further questions about her blood pressure medications. When I call, Pamela Collier reports that her son and granddaughter are currently with her, visiting from [place]. Reports that Nurse Care Manager Janci Minor did an excellent job of explaining to her the confusion about her blood pressure medications and that she does not have any further questions.   Reports that she is about to call [facility] as she is running low on her Lyrica. Confirm with patient that she still has the instructions that I wrote out for her to assist her with calling [facility] and speaking to a live person. Patient states that she will call me if she has any trouble with ordering the medication. Reports that she has no further medication questions for me at this time.   PLAN: Will follow up with  Pamela Collier as previously planned on 01/24/15 by phone call.  Harlow Asa, PharmD Clinical Pharmacist [facility] [phone]

## 2015-01-18 ENCOUNTER — Other Ambulatory Visit: Payer: Self-pay | Admitting: *Deleted

## 2015-01-18 ENCOUNTER — Telehealth: Payer: Self-pay

## 2015-01-18 ENCOUNTER — Ambulatory Visit: Payer: Self-pay | Admitting: *Deleted

## 2015-01-18 ENCOUNTER — Encounter: Payer: Medicare Other | Admitting: Internal Medicine

## 2015-01-18 NOTE — Progress Notes (Signed)
[facility]     Assessment and Plan: Pulmonary sarcoidosis As noted above were going to request records of the CT scan of her chest from [place].  Currently she only gets short of breath when she exerts herself significantly. I think that her dyspnea is primarily related to deconditioning and I agree with the plan from her primary care physician to try to get her into a rehabilitation program.  Plan: Continue using Combivent as needed Have dyspnea does worsen then consider repeating a pulmonary function test, though she's had difficulty with this in the past   SOB (shortness of breath) Multifactorial problem primarily related to deconditioning. We will check a chest x-ray as detailed above.   Cardiomyopathy, ischemic Apparently she had a new percutaneous coronary intervention to her right coronary artery when she was hospitalized in March 2016. I have personally reviewed the records from her discharge summary which confirm this. However, there was not a copy of the catheterization report. She saw cardiology today. We will try get records from her cardiology visit today as well as more information when we request records from the hospital in [place].   Date: 01/18/2015  MRN# [id number] Pamela Collier 01/20/1941  Referring Physician:   TASHAWNDA BLEILER is a 74 y.o. old female seen in consultation for chief complaint of:   No chief complaint on file.  HPI:   Patient is a 74 year old female who was initially seen in our pulmonary office by Dr. Melvyn Novas. She is known to have pulmonary sarcoidosis, and ischemic cardiomyopathy. She recently had a CAT scan on September 1, which showed a right-sided pleural effusion, she was subsequently sent to interventional radiology for thoracentesis. However, at that time it was noted that the pleural effusion was not large enough to tap. She also has a history of ischemic cardiomyopathy with ejection fraction of 35-40% in the  past.  CT of the chest from 01/11/2015 was reviewed, images as well as interpretation. There appears to be a small to moderate right-sided layering pleural effusion. Lung parenchyma appears to be within normal limits, there appears to be extensive hilar mediastinal lymph node calcifications present. This appears to be consistent with granulomatous disease.  Echocardiogram results from 08/17/2014 from [facility] in [place], [place] showed an ejection fraction of 60%.  CPAP titration study from 12/14/2013 showed the patient requires CPAP level of 8 mmHg with good response.  Echocardiogram from 08/13/2012 shows ejection fraction 40-45%  Spirometry from 03/15/2013 shows restrictive defect with an FVC of 49% of predicted and FEV1 of 45% of predicted.  PMHX:   Past Medical History  Diagnosis Date  . Diabetes mellitus   . 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 Cardiomyopathy, ischemic Jan 2013    EF 35 to 40% [facility])  . Hepatic steatosis     by CT abd pelvis  . Osteoporosis, post-menopausal   . 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 Hypertension   . Hyperlipidemia   . Sarcoidosis   . CHF (congestive heart failure)   . Renal insufficiency   . 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 [facility] (congestive cardiac failure) 08/17/2013  . Pleural effusion 10/25/2012    10/2012 CT chest >> small to moderate R lung effusion>> chylothorax,  100% lymphs 10/2013 thoracentesis> cytology negative, WBC 1471, > 90% "small lymphs" 01/2014 CT chest> near complete resolution of pleural effusion, stable lymphadenopathy 08/2014 CT chest [place] ([facility]): small right sided effusion decreased in size, stable mediastinal lymphadenopathy 1.0cm largest, 24m . Pulmonary  sarcoidosis 12/07/2012    [place] 03[place]2013 Full PFT [facility] > UNACCEPTABLE AND NOT REPRODUCIBLE DATA> Ratio 71% FEV 1 1.02 L (55% pred), FVC 1.31 L (49% pred) could not do lung volumes or DLCO   . Acute respiratory failure 11/21/2014  . Diabetic diarrhea 10/03/2014  . DM type 2, uncontrolled, with renal complications 84/07/1538 . Cerebral infarct 08/17/2013  . Arthritis 08/05/2013  . 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 Hyperlipidemia LDL goal <100 02/23/2014   Surgical Hx:  Past Surgical History  Procedure Laterality Date  . Abdominal hysterectomy      at ge 39. secondary to bleeding  . Toe amputation  Sept 2012    Right 2nd toe, [place]  . Ptca  August 2012    Right Posterior tibial artery , [place]  . Coronary angioplasty with stent placement    . Colonoscopy with propofol N/A 01/09/2015    Procedure: COLONOSCOPY WITH PROPOFOL;  Surgeon: DLucilla Lame MD;  Location: [facility] ENDOSCOPY;  Service: Endoscopy;  Laterality: N/A;   Family Hx:  Family History  Problem Relation Age of Onset  . Heart disease Father   . Heart disease Sister   . Heart disease Brother   . Asthma Grandchild   . Breast cancer Maternal Aunt    Social Hx:   Social History  Substance Use Topics  . Smoking status: Never Smoker   . Smokeless tobacco: Never Used  . Alcohol Use: No   Medication:   Current Outpatient Rx  Name  Route  Sig  Dispense  Refill  . albuterol (PROVENTIL) (2.5 MG/3ML) 0.083% nebulizer solution   Nebulization   Take 3 mLs (2.5 mg total) by nebulization every 6 (six) hours as needed for wheezing or shortness of breath.   150 mL   1   . alendronate (FOSAMAX) 70 MG tablet      Take with a full glass of water on an empty stomach every Sunday  Take with a full glass of water and take no other meds or food for   30 minutes .   12 tablet   1   . ALPRAZolam (XANAX) 0.5 MG tablet   Oral   Take 1 tablet (0.5 mg total) by mouth daily as needed  for sleep or anxiety.   90 tablet   0   . aspirin 81 MG EC tablet   Oral   Take 1 tablet (81 mg total) by mouth daily. Swallow whole.   30 tablet   12   . Blood Glucose Monitoring Suppl (TRUERESULT BLOOD GLUCOSE) W/DEVICE KIT   Does not apply   1 kit by Does not apply route 3 (three) times daily.   1 each   0     DX : 250.00   . BRILINTA 90 MG TABS tablet   Oral   Take 1 tablet (90 mg total) by mouth 2 (two) times daily.   180 tablet   2     Dispense as written.   . COMBIVENT RESPIMAT 20-100 MCG/ACT AERS respimat   Inhalation   Inhale 1 puff into the lungs 3 (three) times daily as needed.   2  Inhaler   3     Dispense as written.   . fluticasone (FLONASE) 50 MCG/ACT nasal spray   Each Nare   Place 2 sprays into both nostrils daily.   16 g   2   . furosemide (LASIX) 20 MG tablet   Oral   Take 1 tablet (20 mg total) by mouth daily.   90 tablet   1   . glucose blood (TRUETEST TEST) test strip      CHECK BLOOD GLUCOSE THREE TIMES DAILY Dx E11.29   100 each   11   . insulin aspart protamine - aspart (NOVOLOG 70/30 MIX) (70-30) 100 UNIT/ML FlexPen      60 units before breakfast,  60 units before dinner.  NEEDS 15 PENS PER MONTH   105 mL   2   . Insulin Pen Needle (B-D UF III MINI PEN NEEDLES) 31G X 5 MM MISC      Use  Three times daily   300 each   3   . isosorbide mononitrate (IMDUR) 30 MG 24 hr tablet   Oral   Take 1 tablet (30 mg total) by mouth daily.   90 tablet   1   . Lactulose 20 GM/30ML SOLN      30 ml every 4 hours until constipation is relieved Patient not taking: Reported on 01/12/2015   236 mL   3     Dispense as written.   [name] losartan (COZAAR) 100 MG tablet   Oral   Take 1 tablet (100 mg total) by mouth daily.   90 tablet   2   . meloxicam (MOBIC) 15 MG tablet   Oral   Take 1 tablet (15 mg total) by mouth daily.   90 tablet   1   . metoprolol tartrate (LOPRESSOR) 25 MG tablet   Oral   Take 0.5 tablets (12.5 mg total) by  mouth 2 (two) times daily.   180 tablet   1   . PARoxetine (PAXIL) 10 MG tablet   Oral   Take 1 tablet (10 mg total) by mouth daily.   90 tablet   1   . pregabalin (LYRICA) 100 MG capsule   Oral   Take 1 capsule (100 mg total) by mouth 3 (three) times daily.   270 capsule   3   . rosuvastatin (CRESTOR) 20 MG tablet   Oral   Take 1 tablet (20 mg total) by mouth daily.   90 tablet   1   . sodium polystyrene (KAYEXALATE) powder               . traMADol (ULTRAM) 50 MG tablet   Oral   Take 1 tablet (50 mg total) by mouth every 6 (six) hours as needed for moderate pain. Patient not taking: Reported on 01/12/2015   120 tablet   3   . TRUEPLUS LANCETS 30G MISC      USE TO CHECK GLUCOSE THREE TIMES DAILY   400 each   0   . Vitamin D, Ergocalciferol, (DRISDOL) 50000 UNITS CAPS capsule   Oral   Take 50,000 Units by mouth every 7 (seven) days.             Allergies:  Gabapentin and Sulfa antibiotics  Review of Systems: Gen:  Denies  fever, sweats, chills HEENT: Denies blurred vision, double vision. bleeds, sore throat Cvc:  No dizziness, chest pain. Resp:   Denies cough or sputum porduction, shortness of breath Gi: Denies swallowing  difficulty, stomach pain. Gu:  Denies bladder incontinence, burning urine Ext:   No Joint pain, stiffness. Skin: No skin rash,  hives Endoc:  No polyuria, polydipsia. Psych: No depression, insomnia. Other:  All other systems were reviewed with the patient and were negative other that what is mentioned in the HPI.   Physical Examination:   VS: There were no vitals taken for this visit.  General Appearance: No distress  Neuro:without focal findings,  speech normal,  HEENT: PERRLA, EOM intact.   Pulmonary: normal breath sounds, No wheezing.  CardiovascularNormal S1,S2.  No m/r/g.   Abdomen: Benign, Soft, non-tender. Renal:  No costovertebral tenderness  GU:  No performed at this time. Endoc: No evident thyromegaly, no signs of  acromegaly. Skin:   warm, no rashes, no ecchymosis  Extremities: normal, no cyanosis, clubbing.  Other findings:    LABORATORY PANEL:   CBC  Recent Labs Lab 01/11/15 1154  WBC 5.6  HGB 11.2*  HCT 33.4*  PLT 237   ------------------------------------------------------------------------------------------------------------------  Chemistries   Recent Labs Lab 01/11/15 1154  NA 142  K 4.1  CL 111  CO2 27  GLUCOSE 92  BUN 19  CREATININE 1.14*  CALCIUM 9.2  AST 23  ALT 14  ALKPHOS 45  BILITOT 0.8   ------------------------------------------------------------------------------------------------------------------  Cardiac Enzymes  Recent Labs Lab 01/11/15 1154  TROPONINI <0.03   ------------------------------------------------------------  RADIOLOGY:  No results found.     Orders for this visit: No orders of the defined types were placed in this encounter.     Thank  you for the consultation and for allowing [facility] to assist in the care of your patient. Our recommendations are noted above.  Please contact us if we can be of further service.   Marda Stalker, MD.  Board Certified in Internal Medicine, Pulmonary Medicine, [place], and Sleep Medicine.  [facility] Office Number: [phone]  Patricia Pesa, M.D.  Vilinda Boehringer, M.D.  Cheral Marker, M.D  This encounter was created in error - please disregard.

## 2015-01-18 NOTE — Telephone Encounter (Signed)
Spoke with Pamela Collier regarding her rectal bleeding and abdominal pain. Pamela Collier had colonoscopy on 01/09/15 and four 4 to 7 mm polyps were removed. Pamela Collier had restarted her Brilinta 2 days after procedure per Dr. Clayborn Bigness (blood thinner clearance). Pamela Collier call still having some bleeding and a small clot. Advised Dr. Allen Norris of her symptoms. He requested she stop her Brilinta for 3 days to see if the bleeding will stop. If not, then she will need to let us know. I have also advised Pamela Collier that during this time if she starts having a large amount of blood and SOB she needs to go to the ER immediately. Pamela Collier understood and will call me on Monday to let me know what is going on.

## 2015-01-18 NOTE — Patient Outreach (Signed)
0730am RNCM received a call from pt. Pt was explaining to [facility] she was having vaginal bleeding and was scared. RNCM asked pt if she was sure it was vaginal bleeding and not rectal bleeding. Pt was sure it was vaginal and stated she was concerned because her MD in [place] had told her if she ever started bleeding to go straight to the emergency room. RNCM asked pt questions about the type, color and amount of bleeding. Pt stated she had seen a lot in the toilet but only a small amount on her maxi pad. RNCM reassured pt and requested pt call MD office when they opened to ask her PCP if she needed to be see. Pt stated she could not find the number and asked if RNCM could find it. RNCM stated yes but she would have to call pt back related to she was driving.  08:15am RNCM contacted pt back to give her the number to her PCP office, when pt answered the phone she told RNCM she had been wrong earlier and the bleeding was actually rectal bleeding. RNCM again questioned about the amount and color. Pt stated when she was having a BM she saw some blood in the toilet, one small clot when she wiped and a small amt on her maxi pad. Not soaking through or causing her to have to change frequently. Pt also stated she was cramping in her lower abdomen. RNCM asked pt if she had Dr. Dorothey Baseman number who had performed her colonoscopy a week and a half ago and she said no. RNCM let pt know she would call their office for her.   08:45 RNCM contacted Ginger at [facility] and made her aware of pt's symptoms and concerns. I also made Ginger aware pt taking Brillenta and aspirin daily. Ginger stated she would contact pt.  09:15 RNCM called pt to confirm she had been able to talk with Ginger. Pt stated she had and that the MD was going to call her back. RNCM and pt discussed her pulmonology appointment and pt was unsure if she was going to cancel it or not. RNCM told pt we would reschedule her home visit to next week.  1300pm RNCM  called to check on pt's symptoms. Pt stated she was only having a small amount of bleeding, not soaking a pad, no black stools and her abd pain had lessened. Pt stated she had not heard back from the MD yet. RNCM advised pt to call office back if she did not hear soon .   1645pm RNCM called to check on pt again, she stated she had just gotten off the phone with the nurse from Dr. Dorothey Baseman office who gave her the MD's instructions to stop Brillenta for 3 days and to go straight to the ER if the bleeding becomes profuse. Pt stated she felt comfortable with these instructions. RNCM confirmed with pt appointment for next week.   Plan: RNCM will make [facility] Pharmacist aware of pt's med change for 3 days. RNCM will see pt next week as planned.   Rutherford Limerick RN, BSN  [facility] [phone])

## 2015-01-23 ENCOUNTER — Ambulatory Visit: Payer: Self-pay | Admitting: *Deleted

## 2015-01-24 ENCOUNTER — Other Ambulatory Visit: Payer: Self-pay | Admitting: Pharmacist

## 2015-01-24 NOTE — Patient Outreach (Signed)
Called to follow up with Mrs. Schieffer about her recent colonoscopy, her medication refilling through [facility] and her medication adherence.  Note that per [facility] telephone encounter from last week, Mrs. Famiglietti had some rectal bleeding following her colonoscopy procedure in which she had had multiple polyps removed. Mrs. Kohtz reports that she called concerned about the bleeding and was instructed to hold her Brilinta for three days to allow time for the bleeding to stop. Mrs. Hunkele reports that she was very nervous about being off of the Brilinta especially after she had also been off of it prior to the procedure itself. Reports that she only ended up holding the Brilinta for 1 day as her bleeding resolved. Reports that she has not had any bleeding since that time. Patient denies any new shortness of breath, chest pain, or swelling or pain in her legs.  Mrs. Maschino reports that she got her Lyrica refilled through [facility], rather than [facility], for this month. Patient reports that due to her focus on resolving other health issues this past week, she did not get to calling [facility] in time to ensure that the refill would arrive prior to her running out. Patient reports that she has a good supply of all of her other medications for now. As this was to be the patient's first time calling in a refill request to [facility] on her own, reminded patient to please call me if she needs any assistance with her next time calling.  Patient reports that she has had no problems with her vision and that she has been carefully filling her pillboxes each week.   Mrs. Caleca states that she has no further medication questions for me at this time. Let her know that I will follow up with her by phone in 1 month, but ask that she please give me a call if she has any questions prior to that time.  Harlow Asa, PharmD Clinical Pharmacist [facility] [phone]

## 2015-01-25 DIAGNOSIS — E1042 Type 1 diabetes mellitus with diabetic polyneuropathy: Secondary | ICD-10-CM | POA: Diagnosis not present

## 2015-01-25 DIAGNOSIS — L97521 Non-pressure chronic ulcer of other part of left foot limited to breakdown of skin: Secondary | ICD-10-CM | POA: Diagnosis not present

## 2015-01-26 IMAGING — CR DG CHEST 2V
1 series · 2 of 2 positions shown · non-contrast
Comparison: none

REASON FOR EXAM: cough, green sputum production
COMMENTS:

PROCEDURE:     DXR - DXR CHEST PA (OR AP) AND LATERAL  - October 03, 2012  [DATE]
RESULT:     Comparison: 09/06/2012

[Series 1: w chest pa · 0.14mm/px · 2 of 2 slices shown]
[im 1/2]
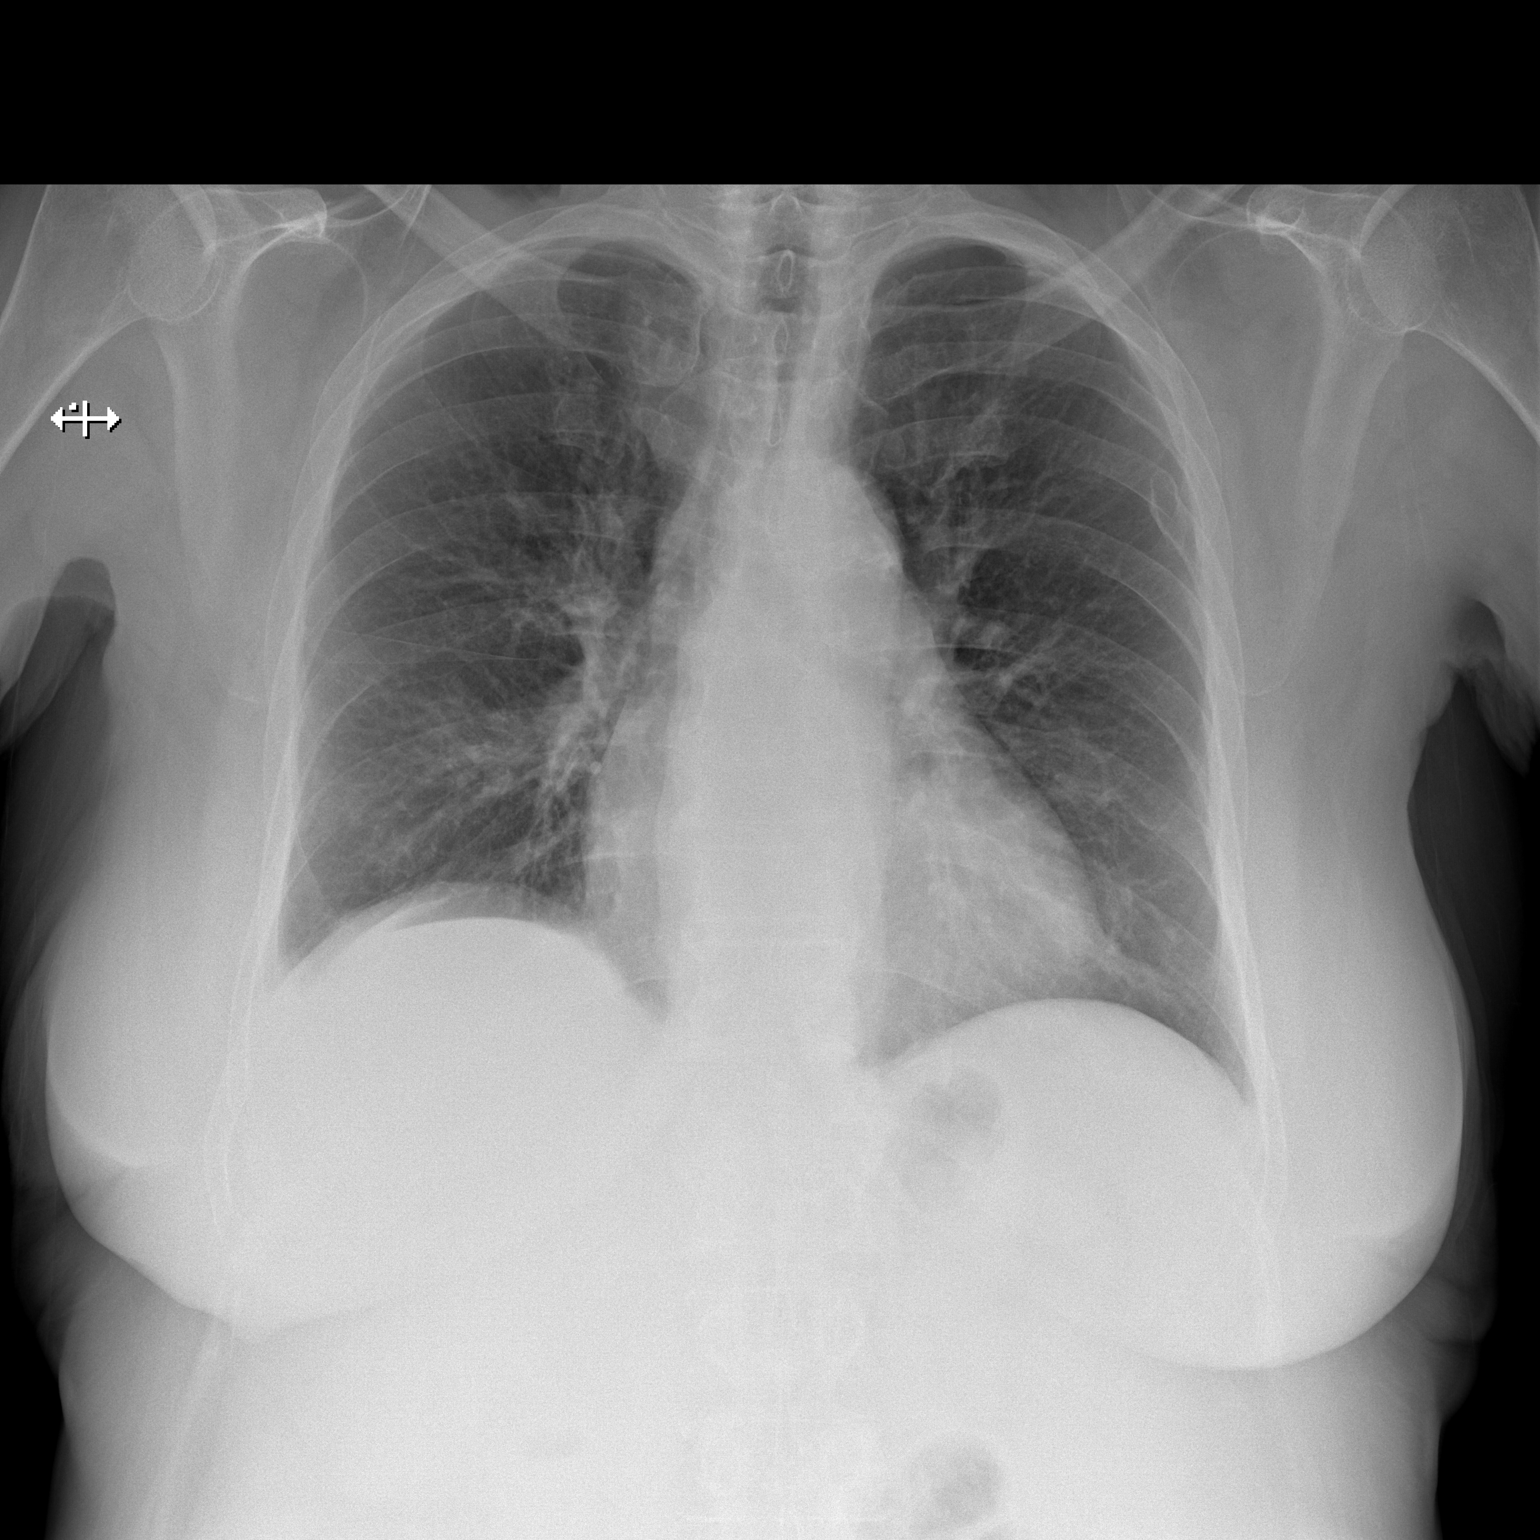
[im 2/2]
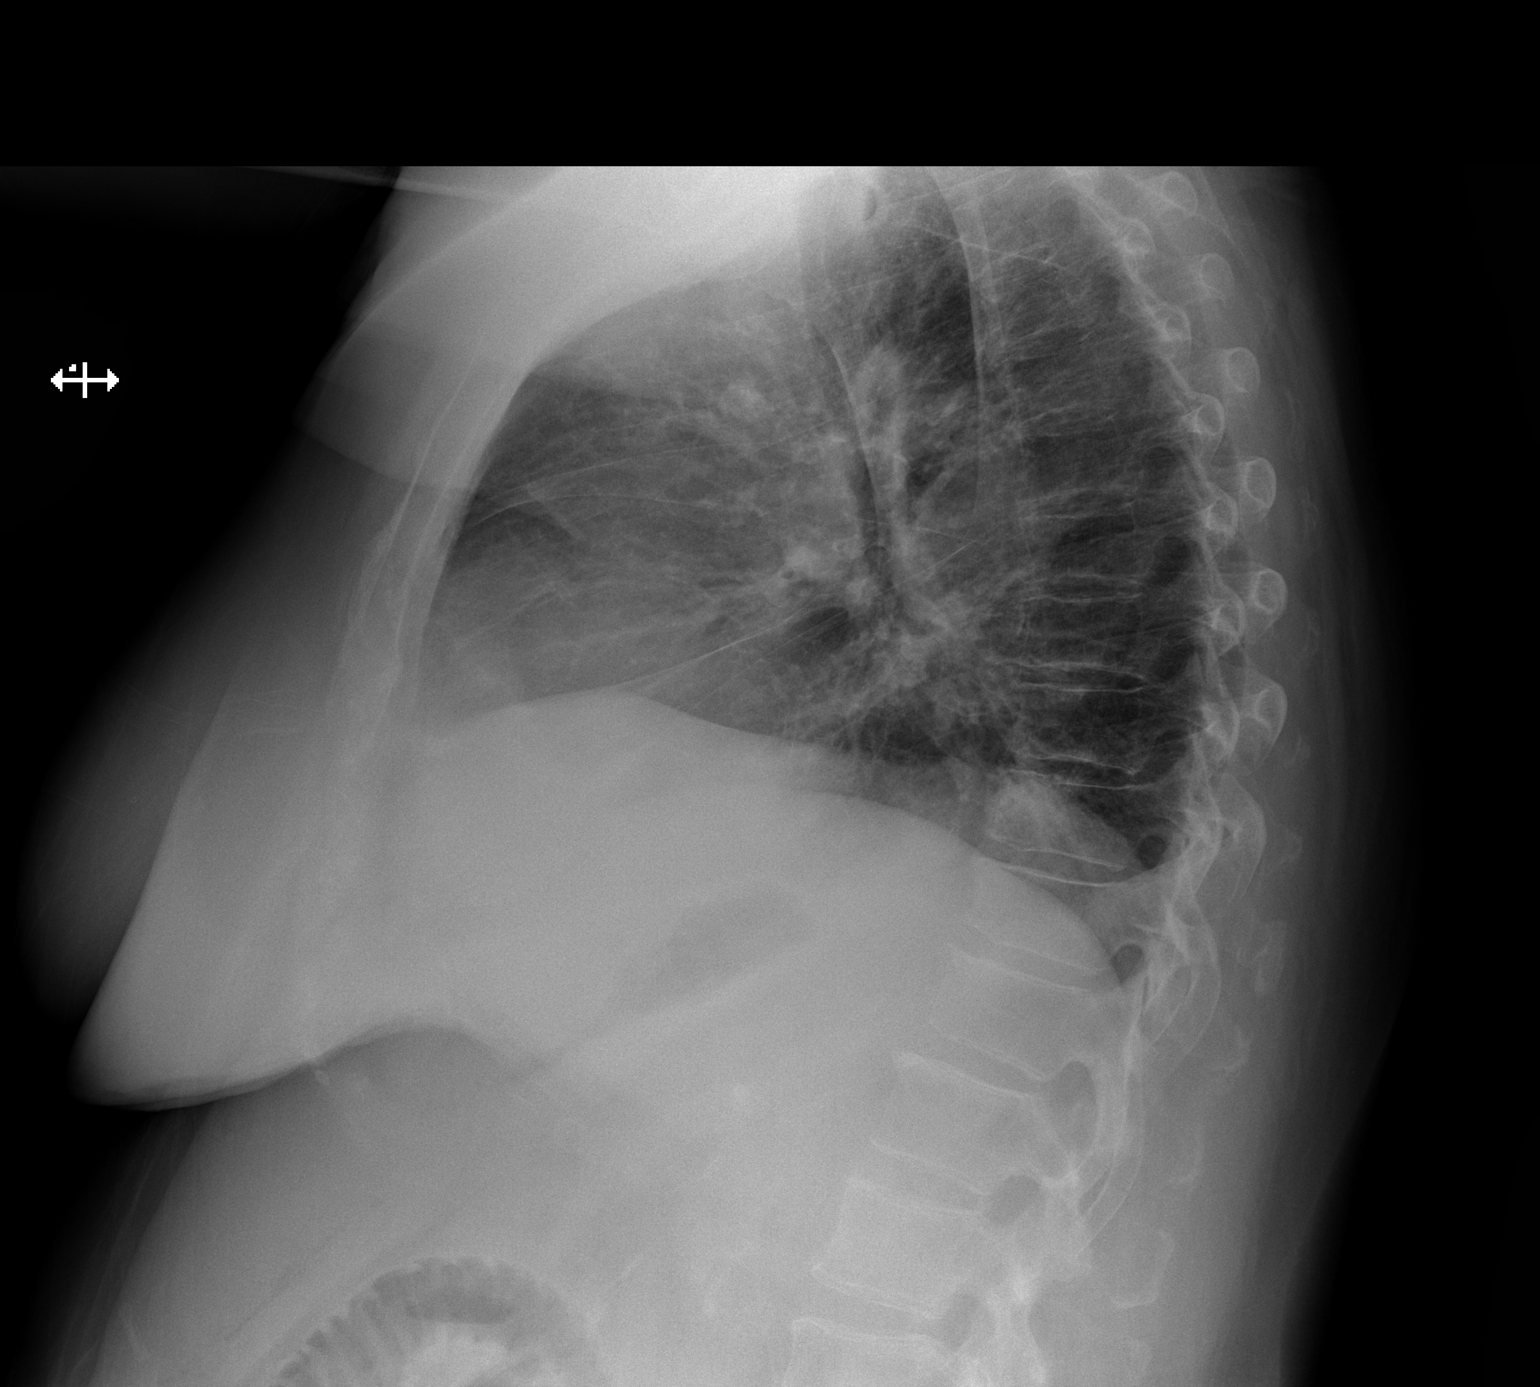

[2 of 2 positions shown; findings below may reference images not displayed]

FINDINGS: The heart and mediastinum are stable. Mild bilateral basilar opacities are
decreased from prior. There is some persistent opacity in the right mid to
lower lung as well as the left lung base. There is an old left lateral rib
fracture.
IMPRESSION: Mild bilateral basilar opacities are decreased from prior. There are some
persistent opacities. Continued followup is recommended to ensure resolution.

## 2015-01-31 ENCOUNTER — Other Ambulatory Visit: Payer: Self-pay | Admitting: *Deleted

## 2015-01-31 NOTE — Patient Outreach (Signed)
[facility]) Care Management   01/31/2015  Pamela Collier [id number]  Pamela Collier is an 74 y.o. female  Subjective:"I still nervous about going out doing my usual activities like crochet class, after I got so sick earlier this year. I miss the people and would like to feel more confident so I can start back." "i remember now I was supposed to follow up with the pulmonologist and I haven't scheduled the appointment."    Objective: Blood pressure 128/72, pulse 62, height 1.702 m (_0 ), weight 187 lb (84.823 kg), SpO2 98 %.  Review of Systems  Respiratory: Positive for sputum production.   Cardiovascular: Positive for claudication. Negative for chest pain.  Musculoskeletal: Negative for falls.  Neurological: Positive for tingling.    Physical Exam  Constitutional: She is oriented to person, place, and time. She appears well-developed and well-nourished.  Cardiovascular: Normal rate and regular rhythm.   Pulses:      Dorsalis pedis pulses are 2+ on the right side, and 2+ on the left side.       Posterior tibial pulses are 2+ on the right side, and 2+ on the left side.  Respiratory: Effort normal and breath sounds normal.  GI: Soft. Normal appearance and bowel sounds are normal.  Musculoskeletal: Normal range of motion.       Right lower leg: She exhibits no swelling.       Left lower leg: She exhibits no swelling.  Neurological: She is alert and oriented to person, place, and time.  Skin: Skin is warm, dry and intact.     Psychiatric: She has a normal mood and affect. Her speech is normal and behavior is normal. Judgment and thought content normal. Cognition and memory are normal.    Current Medications:   Current Outpatient Prescriptions  Medication Sig Dispense Refill  . albuterol (PROVENTIL) (2.5 MG/3ML) 0.083% nebulizer solution Take 3 mLs (2.5 mg total) by nebulization every 6 (six) hours as needed for wheezing or shortness of breath. 150 mL  1  . alendronate (FOSAMAX) 70 MG tablet Take with a full glass of water on an empty stomach every Sunday  Take with a full glass of water and take no other meds or food for   30 minutes . 12 tablet 1  . ALPRAZolam (XANAX) 0.5 MG tablet Take 1 tablet (0.5 mg total) by mouth daily as needed for sleep or anxiety. 90 tablet 0  . aspirin 81 MG EC tablet Take 1 tablet (81 mg total) by mouth daily. Swallow whole. 30 tablet 12  . Blood Glucose Monitoring Suppl (TRUERESULT BLOOD GLUCOSE) W/DEVICE KIT 1 kit by Does not apply route 3 (three) times daily. 1 each 0  . BRILINTA 90 MG TABS tablet Take 1 tablet (90 mg total) by mouth 2 (two) times daily. 180 tablet 2  . COMBIVENT RESPIMAT 20-100 MCG/ACT AERS respimat Inhale 1 puff into the lungs 3 (three) times daily as needed. 2 Inhaler 3  . fluticasone (FLONASE) 50 MCG/ACT nasal spray Place 2 sprays into both nostrils daily. 16 g 2  . furosemide (LASIX) 20 MG tablet Take 1 tablet (20 mg total) by mouth daily. 90 tablet 1  . glucose blood (TRUETEST TEST) test strip CHECK BLOOD GLUCOSE THREE TIMES DAILY Dx E11.29 100 each 11  . insulin aspart protamine - aspart (NOVOLOG 70/30 MIX) (70-30) 100 UNIT/ML FlexPen 60 units before breakfast,  60 units before dinner.  NEEDS 15 PENS PER MONTH 105 mL 2  .  Insulin Pen Needle (B-D UF III MINI PEN NEEDLES) 31G X 5 MM MISC Use  Three times daily 300 each 3  . isosorbide mononitrate (IMDUR) 30 MG 24 hr tablet Take 1 tablet (30 mg total) by mouth daily. 90 tablet 1  . losartan (COZAAR) 100 MG tablet Take 1 tablet (100 mg total) by mouth daily. 90 tablet 2  . meloxicam (MOBIC) 15 MG tablet Take 1 tablet (15 mg total) by mouth daily. 90 tablet 1  . metoprolol tartrate (LOPRESSOR) 25 MG tablet Take 0.5 tablets (12.5 mg total) by mouth 2 (two) times daily. 180 tablet 1  . PARoxetine (PAXIL) 10 MG tablet Take 1 tablet (10 mg total) by mouth daily. 90 tablet 1  . pregabalin (LYRICA) 100 MG capsule Take 1 capsule (100 mg total) by mouth  3 (three) times daily. 270 capsule 3  . rosuvastatin (CRESTOR) 20 MG tablet Take 1 tablet (20 mg total) by mouth daily. 90 tablet 1  . Lactulose 20 GM/30ML SOLN 30 ml every 4 hours until constipation is relieved (Patient not taking: Reported on 01/12/2015) 236 mL 3  . sodium polystyrene (KAYEXALATE) powder     . traMADol (ULTRAM) 50 MG tablet Take 1 tablet (50 mg total) by mouth every 6 (six) hours as needed for moderate pain. (Patient not taking: Reported on 01/12/2015) 120 tablet 3  . TRUEPLUS LANCETS 30G MISC USE TO CHECK GLUCOSE THREE TIMES DAILY 400 each 0  . Vitamin D, Ergocalciferol, (DRISDOL) 50000 UNITS CAPS capsule Take 50,000 Units by mouth every 7 (seven) days.     No current facility-administered medications for this visit.   Facility-Administered Medications Ordered in Other Visits  Medication Dose Route Frequency Provider Last Rate Last Dose  . albuterol (PROVENTIL) (5 MG/ML) 0.5% nebulizer solution 2.5 mg  2.5 mg Nebulization Once [name], MD        Functional Status:   In your present state of health, do you have any difficulty performing the following activities: 10/20/2014  Hearing? N  Vision? N  Difficulty concentrating or making decisions? N  Walking or climbing stairs? Y  Dressing or bathing? N  Doing errands, shopping? N  Preparing Food and eating ? N  Using the Toilet? N  In the past six months, have you accidently leaked urine? N  Do you have problems with loss of bowel control? N  Managing your Medications? N  Managing your Finances? Y  Housekeeping or managing your Housekeeping? Y    Fall/Depression Screening:    PHQ 2/9 Scores 10/30/2014 10/30/2014 10/20/2014 07/23/2014 11/22/2013 08/03/2013 04/03/2012  PHQ - 2 Score 0 0 _0 0 2  PHQ- 9 Score - - 5 - 3 - 10  Exception Documentation - - - Other- indicate reason in comment box - - -    Assessment: See physical assessment as above.   General: Pt alert and oriented in good spirits. Talking about a recent  visit by her grandchild.   Diabetes: Pt's blood sugars have reduced in all averages from last month. Pt reports feeling more confident reading labels and counting carbs. Pt had visited her podiatrist and her feet are with bilat skin intact and +pedal pulses bilat.   HF: Pt continuing to weigh daily. RNCM requested pt name s/s of worsening HF with an action plan. Pt able to verbalize symptoms and action plan. Pt without weight gain, or edema. No worsening SOB.   COPD: Continues to use nebulizer, inhaler and c pap as prescribed. Lungs  clear this visit. Denies colored sputum.  Medications: Pt continues to fill pill organizer with correct medications. RNCM and pt discussed each medication and their uses. RNCM wrote medication purpose on each bottle for another reminder. Pt continues using color coded list to keep herself organized with her medications.   Advanced Directive: Pt stated now that she has gotten the colonoscopy behind her she feels ready to discuss advanced directive with SW.   Plan: RNCM will see pt at her home in October.  RNCM will make SW aware of pt's readiness to complete advanced directive.   Janci Minor RN, BSN  [facility] Care Management [phone])  [facility] CM Care Plan Problem One        Most Recent Value   Care Plan Problem One  Pt would like to improve health to be able to enjoy and participate in a family beach trip in September.   Role Documenting the Problem One  Care Management Coordinator   Care Plan for Problem One  Not Active   THN Long Term Goal (31-90 days)  Pt will not be admitted to the hospital in the next 90 days   THN Long Term Goal Start Date  10/20/14   [facility] Long Term Goal Met Date  01/31/15   Interventions for Problem One Long Term Goal  Medication review done with pt. Education on heart healthy and diabetic diet. Education on the importance of exercise. [facility] calendar given and with HF, COPD and diabetic  teaching given.   THN CM Short Term Goal #1 (0-30 days)   Pt will be able to identify and count carbs in the next 30 days.    THN CM Short Term Goal #1 Start Date  10/20/14   [facility] CM Short Term Goal #1 Met Date  11/17/14   Interventions for Short Term Goal #1  EMMI on diabetic diet, carb Counting sheet. Assisted pt in signing up for diabetic teaching at [facility].   [facility]N CM Short Term Goal #2 (0-30 days)  Pt will walk to park beside her home and exercise 3-4 times week for next 30 days    THN CM Short Term Goal #2 Start Date  11/17/14   [facility] CM Short Term Goal #2 Met Date  12/18/14   Interventions for Short Term Goal #2  Education on the benefits of exercise. Reinforced the benefits of exercise for diabetes and HF   THN CM Short Term Goal #3 (0-30 days)  Pt will weigh and record weights for the 30days, recognizing an increase of 3lbs in 1 day or 5lbs in 7 days as a reason to call the MD.   [facility] CM Short Term Goal #3 Start Date  11/17/14   [facility] CM Short Term Goal #3 Met Date  12/18/14   Interventions for Short Tern Goal #3  Pt educated on the importance of daily weights in heart failure. Reinforced the HF zones pt has on her refrigerator.   THN CM Short Term Goal #4 (0-30 days)  Pt will obtain and record b/p every week with home monitor for the next 30 days.   THN CM Short Term Goal #4 Start Date  12/18/14   [facility] CM Short Term Goal #4 Met Date  -- [pt did not meet this goal/does not want to restart]   Interventions for Short Term Goal #4  RNCM placed todays b/p on the calendar and marked off each Monday for pt to put new readings. Discussed the importance of knowing her numbers and  what normals are.   THN CM Short Term Goal #5 (0-30 days)  Pt will go through colonoscopy with out complication in the next 30 days.   THN CM Short Term Goal #5 Start Date  12/18/14   [facility] CM Short Term Goal #5 Met Date  01/31/15   Interventions for Short Term Goal #5  RNCM went over all instructions for colonoscopy with pt. RNCM marked on pt's calendar the different diets she  was to take part in. RNCM wrote plainly the medications she was to stop and take on the procedure day. RNCM and pt went over the directions for the sure prep. RNCM talked with pt about being safe as a diabetic on a clear liquid diet.     [facility] CM Care Plan Problem Two        Most Recent Value   Care Plan Problem Two  Pt would like to be able to participate in more of her usual activities including crochet classes at [facility] center    Role Documenting the Problem Two  Care Management [place] for Problem Two  Active   Interventions for Problem Two Long Term Goal   RNCM talked with pt about her concerns, and made goals to increase  confidence in herself and increase her strength to participate.   THN Long Term Goal (31-90) days  Pt will be able to attend crochet class at the [place] [facility] before the next 90 days   THN Long Term Goal Start Date  01/31/15   [facility]N CM Short Term Goal #1 (0-30 days)  Pt will make a f/u appointmnet with her pulmonologist in the next 30 days   THN CM Short Term Goal #1 Start Date  01/31/15   Interventions for Short Term Goal #2   RNCM inquired about pt's f/u appointment. Pt stated she had not made one as requested by provider. RNCM assisted pt in finding the number for pt and wrote a reminder to assist pt in remembering to call.    THN CM Short Term Goal #2 (0-30 days)  Pt will increase her strength by starting to do chair exercise video she already has 2 times per week and walk in a store 1 time per week. for the next 30 days.   THN CM Short Term Goal #2 Start Date  01/31/15   Interventions for Short Term Goal #2  RNCM discussed with pt ways to increase her strength and have more confidence in her ability to be independant. RNCM assisted pt in a simple way to record days she was sucessful in exercising.

## 2015-02-02 ENCOUNTER — Telehealth: Payer: Self-pay | Admitting: *Deleted

## 2015-02-02 ENCOUNTER — Other Ambulatory Visit (INDEPENDENT_AMBULATORY_CARE_PROVIDER_SITE_OTHER): Payer: Medicare Other

## 2015-02-02 DIAGNOSIS — J90 Pleural effusion, not elsewhere classified: Secondary | ICD-10-CM | POA: Diagnosis not present

## 2015-02-02 DIAGNOSIS — T3695XA Adverse effect of unspecified systemic antibiotic, initial encounter: Secondary | ICD-10-CM

## 2015-02-02 DIAGNOSIS — K521 Toxic gastroenteritis and colitis: Secondary | ICD-10-CM

## 2015-02-02 LAB — PROTIME-INR
INR: 1.2 ratio — AB (ref 0.8–1.0)
PROTHROMBIN TIME: 12.9 s (ref 9.6–13.1)

## 2015-02-02 LAB — CBC WITH DIFFERENTIAL/PLATELET
BASOS ABS: 0 10*3/uL (ref 0.0–0.1)
Basophils Relative: 0.4 % (ref 0.0–3.0)
EOS PCT: 1.9 % (ref 0.0–5.0)
Eosinophils Absolute: 0.1 10*3/uL (ref 0.0–0.7)
HEMATOCRIT: 35.2 % — AB (ref 36.0–46.0)
HEMOGLOBIN: 11.9 g/dL — AB (ref 12.0–15.0)
LYMPHS PCT: 20.5 % (ref 12.0–46.0)
Lymphs Abs: 1.4 10*3/uL (ref 0.7–4.0)
MCHC: 33.7 g/dL (ref 30.0–36.0)
MCV: 87.5 fl (ref 78.0–100.0)
MONOS PCT: 8.4 % (ref 3.0–12.0)
Monocytes Absolute: 0.6 10*3/uL (ref 0.1–1.0)
Neutro Abs: 4.7 10*3/uL (ref 1.4–7.7)
Neutrophils Relative %: 68.8 % (ref 43.0–77.0)
Platelets: 281 10*3/uL (ref 150.0–400.0)
RBC: 4.03 Mil/uL (ref 3.87–5.11)
RDW: 14.9 % (ref 11.5–15.5)
WBC: 6.8 10*3/uL (ref 4.0–10.5)

## 2015-02-02 NOTE — Telephone Encounter (Signed)
Patient stated that she dropped off a form for diabetic shoes to filled out , and was wanting a follow up. Patient also was concerned if she needed a scheduled follow up appt.- Thanks

## 2015-02-02 NOTE — Telephone Encounter (Signed)
Left message for patient to call office.  

## 2015-02-06 NOTE — Telephone Encounter (Signed)
Appointment scheduled for follow up for DM and patient advised to bring form with her to visit.

## 2015-02-09 ENCOUNTER — Ambulatory Visit
Admission: RE | Admit: 2015-02-09 | Discharge: 2015-02-09 | Disposition: A | Discharge status: Home / Self Care | Payer: Medicare Other | Source: Ambulatory Visit | Attending: Internal Medicine | Admitting: Internal Medicine

## 2015-02-09 ENCOUNTER — Ambulatory Visit (INDEPENDENT_AMBULATORY_CARE_PROVIDER_SITE_OTHER): Payer: Medicare Other | Admitting: Internal Medicine

## 2015-02-09 ENCOUNTER — Encounter: Payer: Self-pay | Admitting: Internal Medicine

## 2015-02-09 ENCOUNTER — Other Ambulatory Visit: Payer: Self-pay | Admitting: *Deleted

## 2015-02-09 VITALS — BP 148/70 | HR 56 | Temp 98.6°F | Resp 12 | Ht 63.0 in | Wt 194.1 lb

## 2015-02-09 DIAGNOSIS — E1165 Type 2 diabetes mellitus with hyperglycemia: Secondary | ICD-10-CM

## 2015-02-09 DIAGNOSIS — R0789 Other chest pain: Secondary | ICD-10-CM | POA: Diagnosis not present

## 2015-02-09 DIAGNOSIS — I255 Ischemic cardiomyopathy: Secondary | ICD-10-CM

## 2015-02-09 DIAGNOSIS — R0602 Shortness of breath: Secondary | ICD-10-CM | POA: Insufficient documentation

## 2015-02-09 DIAGNOSIS — E559 Vitamin D deficiency, unspecified: Secondary | ICD-10-CM | POA: Diagnosis not present

## 2015-02-09 DIAGNOSIS — R0600 Dyspnea, unspecified: Secondary | ICD-10-CM | POA: Diagnosis not present

## 2015-02-09 DIAGNOSIS — R059 Cough, unspecified: Secondary | ICD-10-CM

## 2015-02-09 DIAGNOSIS — R05 Cough: Secondary | ICD-10-CM | POA: Insufficient documentation

## 2015-02-09 DIAGNOSIS — E1129 Type 2 diabetes mellitus with other diabetic kidney complication: Secondary | ICD-10-CM | POA: Diagnosis not present

## 2015-02-09 DIAGNOSIS — IMO0002 Reserved for concepts with insufficient information to code with codable children: Secondary | ICD-10-CM

## 2015-02-09 LAB — CBC WITH DIFFERENTIAL/PLATELET
BASOS PCT: 0 % (ref 0–1)
Basophils Absolute: 0 10*3/uL (ref 0.0–0.1)
EOS PCT: 2 % (ref 0–5)
Eosinophils Absolute: 0.2 10*3/uL (ref 0.0–0.7)
HCT: 33.5 % — ABNORMAL LOW (ref 36.0–46.0)
Hemoglobin: 11 g/dL — ABNORMAL LOW (ref 12.0–15.0)
Lymphocytes Relative: 20 % (ref 12–46)
Lymphs Abs: 1.5 10*3/uL (ref 0.7–4.0)
MCH: 29.1 pg (ref 26.0–34.0)
MCHC: 32.8 g/dL (ref 30.0–36.0)
MCV: 88.6 fL (ref 78.0–100.0)
MONO ABS: 0.9 10*3/uL (ref 0.1–1.0)
MPV: 8.8 fL (ref 8.6–12.4)
Monocytes Relative: 12 % (ref 3–12)
NEUTROS ABS: 5 10*3/uL (ref 1.7–7.7)
Neutrophils Relative %: 66 % (ref 43–77)
Platelets: 259 10*3/uL (ref 150–400)
RBC: 3.78 MIL/uL — AB (ref 3.87–5.11)
RDW: 15.3 % (ref 11.5–15.5)
WBC: 7.5 10*3/uL (ref 4.0–10.5)

## 2015-02-09 MED ORDER — DOXYCYCLINE HYCLATE 100 MG PO CAPS: 100.0000 mg | ORAL_CAPSULE | Freq: Two times a day (BID) | ORAL | Status: DC

## 2015-02-09 NOTE — Patient Outreach (Signed)
11:25am RNCM called pt back to inquire if she had received a call back from provider office. Pt stated no. RNCM made pt aware she would get in touch with provider office and make sure they received her message.   11:28am RNCM called provider office and spoke with Rasheedah. RNCM explained pt symptoms to her and she was able to make pt a same day office visit with Dr. Derrel Nip at 2:30pm. RNCM made Rahsheedah aware she would call pt back and let her know of pt's appointment today.   11:30am RNCM called pt to make her aware of her appointment at 2:30pm. Pt verbalized she was very thankful to be able to be seen. RNCM again reinforced pt to use nebulizer q6hrs for increased SOB. Pt verbalized understanding. Pt stated she had transportation to the MD office.   Plan: RNCM plans to see pt in 3 weeks.   Rutherford Limerick RN, BSN  [facility] Care Management [phone])

## 2015-02-09 NOTE — Patient Outreach (Signed)
RNCM received a phone call from pt stating she was coughing a lot more today than usual. She stated she was feeling really bad and at times felt her phlegm was choking her. Pt stated her phlegm was white in color. Pt stated she was not having a hard time catching her breath. RNCM made pt aware she could use her nebulizer every 6 hours as needed, pt verbalized she was aware of this. Pt stated she had called her MD office to let them know but was unable to get through easily and was afraid she did not leave a message adequately. Pt requesting RNCM call MD office and make sure they received her message. Pt was hoping for some cough medication to relieve the cough. RNCM made pt aware she would make sure MD received her message. As pt and RNCM were on the phone pt was receiving a phone call so pt hung up with RNCM in hopes it was the MD office.   Plan: RNCM will call pt back to see if the phone call she received was Md office.  RNCM will call pt's MD and make sure she received pt message if it was not.   Rutherford Limerick RN, BSN  [facility] Care Management [phone])

## 2015-02-09 NOTE — Patient Instructions (Addendum)
Please increase your furosemide to two times daily over the weekend  Until your weight comes down to 190 or less   And ask the RN to help you weigh yourself   Please take the antibiotic  For the next 7 days along with a probiotic to prevent diarrhea   Please  go get a chest X ray this afternoon

## 2015-02-09 NOTE — Assessment & Plan Note (Addendum)
With weight gain,  Thick white sputum.  No infectious symptoms ,  But given the late hour d given the dau is Friday,  will treat for CHF and COPD  with increased furosemide  dose and cover with doxycycline.

## 2015-02-09 NOTE — Progress Notes (Addendum)
Subjective:  Patient ID: Pamela Collier, female    DOB: [birth year]  Age: 74 y.o. MRN: [id number]  CC: The primary encounter diagnosis was Cough. Diagnoses of DM type 2, uncontrolled, with renal complications, Dyspnea, Other chest pain, and Vitamin D deficiency were also pertinent to this visit.  HPI Pamela Collier presentsas a work in for sudden onset of  productive cough and right sided chest pain .  symptoms started this morning and she has increased sputum and rdyspnea..  No fevers,    Some chest tightness relieved with albuterol   7 lb st gain in the past 9 days.  Says sometimes her scale does work right but last two known weights at home suggestin wt gain is luid,  And hse feels he abdomen is distended    Subacute onset of shakiness and dizziness in the morning .  Sugars are not dropping low in the am  Morning sugars have been  95 to 135  .    Post prandial evening 149  To 202   Finished Vit D 50K 2 weeks ago   orthostasis symptoms with standing up , improve after a minute.        Outpatient Prescriptions Prior to Visit  Medication Sig Dispense Refill  . albuterol (PROVENTIL) (2.5 MG/3ML) 0.083% nebulizer solution Take 3 mLs (2.5 mg total) by nebulization every 6 (six) hours as needed for wheezing or shortness of breath. 150 mL 1  . alendronate (FOSAMAX) 70 MG tablet Take with a full glass of water on an empty stomach every Sunday  Take with a full glass of water and take no other meds or food for   30 minutes . 12 tablet 1  . ALPRAZolam (XANAX) 0.5 MG tablet Take 1 tablet (0.5 mg total) by mouth daily as needed for sleep or anxiety. 90 tablet 0  . aspirin 81 MG EC tablet Take 1 tablet (81 mg total) by mouth daily. Swallow whole. 30 tablet 12  . Blood Glucose Monitoring Suppl (TRUERESULT BLOOD GLUCOSE) W/DEVICE KIT 1 kit by Does not apply route 3 (three) times daily. 1 each 0  . BRILINTA 90 MG TABS tablet Take 1 tablet (90 mg total) by mouth 2 (two) times daily. 180 tablet 2  .  COMBIVENT RESPIMAT 20-100 MCG/ACT AERS respimat Inhale 1 puff into the lungs 3 (three) times daily as needed. 2 Inhaler 3  . fluticasone (FLONASE) 50 MCG/ACT nasal spray Place 2 sprays into both nostrils daily. 16 g 2  . furosemide (LASIX) 20 MG tablet Take 1 tablet (20 mg total) by mouth daily. 90 tablet 1  . glucose blood (TRUETEST TEST) test strip CHECK BLOOD GLUCOSE THREE TIMES DAILY Dx E11.29 100 each 11  . insulin aspart protamine - aspart (NOVOLOG 70/30 MIX) (70-30) 100 UNIT/ML FlexPen 60 units before breakfast,  60 units before dinner.  NEEDS 15 PENS PER MONTH 105 mL 2  . Insulin Pen Needle (B-D UF III MINI PEN NEEDLES) 31G X 5 MM MISC Use  Three times daily 300 each 3  . isosorbide mononitrate (IMDUR) 30 MG 24 hr tablet Take 1 tablet (30 mg total) by mouth daily. 90 tablet 1  . losartan (COZAAR) 100 MG tablet Take 1 tablet (100 mg total) by mouth daily. 90 tablet 2  . meloxicam (MOBIC) 15 MG tablet Take 1 tablet (15 mg total) by mouth daily. 90 tablet 1  . metoprolol tartrate (LOPRESSOR) 25 MG tablet Take 0.5 tablets (12.5 mg total) by mouth 2 (two)  times daily. 180 tablet 1  . PARoxetine (PAXIL) 10 MG tablet Take 1 tablet (10 mg total) by mouth daily. 90 tablet 1  . pregabalin (LYRICA) 100 MG capsule Take 1 capsule (100 mg total) by mouth 3 (three) times daily. 270 capsule 3  . rosuvastatin (CRESTOR) 20 MG tablet Take 1 tablet (20 mg total) by mouth daily. 90 tablet 1  . traMADol (ULTRAM) 50 MG tablet Take 1 tablet (50 mg total) by mouth every 6 (six) hours as needed for moderate pain. 120 tablet 3  . TRUEPLUS LANCETS 30G MISC USE TO CHECK GLUCOSE THREE TIMES DAILY 400 each 0  . Lactulose 20 GM/30ML SOLN 30 ml every 4 hours until constipation is relieved (Patient not taking: Reported on 02/09/2015) 236 mL 3  . sodium polystyrene (KAYEXALATE) powder     . Vitamin D, Ergocalciferol, (DRISDOL) 50000 UNITS CAPS capsule Take 50,000 Units by mouth every 7 (seven) days.     Facility-Administered  Medications Prior to Visit  Medication Dose Route Frequency Provider Last Rate Last Dose  . albuterol (PROVENTIL) (5 MG/ML) 0.5% nebulizer solution 2.5 mg  2.5 mg Nebulization Once [name], MD        Review of Systems;  Patient denies headache, fevers, malaise, unintentional weight loss, skin rash, eye pain, sinus congestion and sinus pain, sore throat, dysphagia,  hemoptysis , cough, dyspnea, wheezing, chest pain, palpitations, orthopnea, edema, abdominal pain, nausea, melena, diarrhea, constipation, flank pain, dysuria, hematuria, urinary  Frequency, nocturia, numbness, tingling, seizures,  Focal weakness, Loss of consciousness,  Tremor, insomnia, depression, anxiety, and suicidal ideation.      Objective:  BP 148/70 mmHg  Pulse 56  Temp(Src) 98.6 F (37 C) (Oral)  Resp 12  Ht 5' 3" (1.6 m)  Wt 194 lb 2 oz (88.055 kg)  BMI 34.40 kg/m2  SpO2 96%  BP Readings from Last 3 Encounters:  02/09/15 148/70  01/31/15 128/72  01/12/15 165/90    Wt Readings from Last 3 Encounters:  02/09/15 194 lb 2 oz (88.055 kg)  01/31/15 187 lb (84.823 kg)  01/12/15 189 lb (85.73 kg)    General appearance: alert, cooperative and appears stated age Ears: normal TM's and external ear canals both ears Throat: lips, mucosa, and tongue normal; teeth and gums normal Neck: no adenopathy, no carotid bruit, supple, symmetrical, trachea midline and thyroid not enlarged, symmetric, no tenderness/mass/nodules Back: symmetric, no curvature. ROM normal. No CVA tenderness. Lungs: clear to auscultation bilaterally Heart: regular rate and rhythm, S1, S2 normal, no murmur, click, rub or gallop Abdomen: soft, non-tender; bowel sounds normal; no masses,  no organomegaly Pulses: 2+ and symmetric Skin: Skin color, texture, turgor normal. No rashes or lesions Lymph nodes: Cervical, supraclavicular, and axillary nodes normal.  Lab Results  Component Value Date   HGBA1C 7.4* 02/09/2015   HGBA1C 8.1*  10/03/2014   HGBA1C 8.4* 06/29/2014    Lab Results  Component Value Date   CREATININE 1.40* 02/09/2015   CREATININE 1.14* 01/11/2015   CREATININE 1.54* 11/02/2014    Lab Results  Component Value Date   WBC 7.5 02/09/2015   HGB 11.0* 02/09/2015   HCT 33.5* 02/09/2015   PLT 259 02/09/2015   GLUCOSE 134* 02/09/2015   CHOL 159 02/23/2014   TRIG 165.0* 02/23/2014   HDL 27.90* 02/23/2014   LDLDIRECT 73.0 10/03/2014   LDLCALC 98 02/23/2014   ALT 13 02/09/2015   AST 18 02/09/2015   NA 141 02/09/2015   K 4.9 02/09/2015   CL 105  02/09/2015   CREATININE 1.40* 02/09/2015   BUN 30* 02/09/2015   CO2 23 02/09/2015   TSH 1.16 07/01/2013   INR 1.2* 02/02/2015   HGBA1C 7.4* 02/09/2015   MICROALBUR 36.4* 02/09/2015    [place] Chest  01/12/2015   CLINICAL DATA:  Pleural effusion  EXAM: CHEST ULTRASOUND  COMPARISON:  CT chest 01/11/2015  FINDINGS: Sonography of the RIGHT pleural space performed.  Small amount RIGHT pleural effusion is identified but this appears insufficient for thoracentesis.  IMPRESSION: Insufficient RIGHT pleural effusion for thoracentesis.   Electronically Signed   By: Lavonia Dana M.D.   On: 01/12/2015 15:00    Assessment & Plan:   Problem List Items Addressed This Visit    DM type 2, uncontrolled, with renal complications ([place])    Improved control .  No changes today .   Lab Results  Component Value Date   HGBA1C 7.4* 02/09/2015   Lab Results  Component Value Date   MICROALBUR 36.4* 02/09/2015   Lab Results  Component Value Date   CHOL 159 02/23/2014   HDL 27.90* 02/23/2014   LDLCALC 98 02/23/2014   LDLDIRECT 73.0 10/03/2014   TRIG 165.0* 02/23/2014   CHOLHDL 6 02/23/2014          Relevant Orders   Microalbumin / creatinine urine ratio (Completed)   HgB A1c (Completed)   CBC with Differential (Completed)   Comp Met (CMET) (Completed)   Cough - Primary    With weight gain,  Thick white sputum.  No infectious symptoms ,  But given the late hour d  given the dau is Friday,  will treat for CHF and COPD  with increased furosemide  dose and cover with doxycycline.       Relevant Orders   DG Chest 2 View (Completed)    Other Visit Diagnoses    Dyspnea        Relevant Orders    DG Chest 2 View (Completed)    CK total and CKMB (cardiac)not at [facility]    B Nat Peptide (Completed)    Other chest pain        Vitamin D deficiency        Relevant Orders    Vit D  25 hydroxy (rtn osteoporosis monitoring)    Vitamin D (25 hydroxy) (Completed)       I am having Ms. Devin start on doxycycline. I am also having her maintain her aspirin, TRUERESULT BLOOD GLUCOSE, Lactulose, TRUEPLUS LANCETS 30G, Vitamin D (Ergocalciferol), glucose blood, sodium polystyrene, alendronate, ALPRAZolam, furosemide, Insulin Pen Needle, isosorbide mononitrate, losartan, meloxicam, rosuvastatin, traMADol, insulin aspart protamine - aspart, BRILINTA, fluticasone, pregabalin, metoprolol tartrate, PARoxetine, COMBIVENT RESPIMAT, and albuterol.  Meds ordered this encounter  Medications  . doxycycline (VIBRAMYCIN) 100 MG capsule    Sig: Take 1 capsule (100 mg total) by mouth 2 (two) times daily.    Dispense:  14 capsule    Refill:  0   A total of 25 minutes of face to face time was spent with patient more than half of which was spent in counselling about the above mentioned conditions  and coordination of care  There are no discontinued medications.  Follow-up: No Follow-up on file.   [name], MD

## 2015-02-09 NOTE — Progress Notes (Signed)
Pre-visit discussion using our clinic review tool. No additional management support is needed unless otherwise documented below in the visit note.  

## 2015-02-10 LAB — COMPREHENSIVE METABOLIC PANEL
ALT: 13 U/L (ref 6–29)
AST: 18 U/L (ref 10–35)
Albumin: 3.7 g/dL (ref 3.6–5.1)
Alkaline Phosphatase: 45 U/L (ref 33–130)
BILIRUBIN TOTAL: 0.8 mg/dL (ref 0.2–1.2)
BUN: 30 mg/dL — AB (ref 7–25)
CO2: 23 mmol/L (ref 20–31)
CREATININE: 1.4 mg/dL — AB (ref 0.60–0.93)
Calcium: 9.6 mg/dL (ref 8.6–10.4)
Chloride: 105 mmol/L (ref 98–110)
GLUCOSE: 134 mg/dL — AB (ref 65–99)
Potassium: 4.9 mmol/L (ref 3.5–5.3)
SODIUM: 141 mmol/L (ref 135–146)
Total Protein: 7.1 g/dL (ref 6.1–8.1)

## 2015-02-10 LAB — HEMOGLOBIN A1C
Hgb A1c MFr Bld: 7.4 % — ABNORMAL HIGH (ref <5.7)
MEAN PLASMA GLUCOSE: 166 mg/dL — AB (ref <117)

## 2015-02-10 LAB — VITAMIN D 25 HYDROXY (VIT D DEFICIENCY, FRACTURES): Vit D, 25-Hydroxy: 23 ng/mL — ABNORMAL LOW (ref 30–100)

## 2015-02-10 LAB — MICROALBUMIN / CREATININE URINE RATIO
CREATININE, URINE: 170.9 mg/dL
MICROALB/CREAT RATIO: 213 mg/g — AB (ref 0.0–30.0)
Microalb, Ur: 36.4 mg/dL — ABNORMAL HIGH (ref <2.0)

## 2015-02-10 LAB — BRAIN NATRIURETIC PEPTIDE: Brain Natriuretic Peptide: 304.1 pg/mL — ABNORMAL HIGH (ref 0.0–100.0)

## 2015-02-11 NOTE — Assessment & Plan Note (Addendum)
Improved control .  No changes today .   Lab Results  Component Value Date   HGBA1C 7.4* 02/09/2015   Lab Results  Component Value Date   MICROALBUR 36.4* 02/09/2015   Lab Results  Component Value Date   CHOL 159 02/23/2014   HDL 27.90* 02/23/2014   LDLCALC 98 02/23/2014   LDLDIRECT 73.0 10/03/2014   TRIG 165.0* 02/23/2014   CHOLHDL 6 02/23/2014

## 2015-02-12 LAB — CK TOTAL AND CKMB (NOT AT ARMC): Total CK: 125 U/L (ref 7–177)

## 2015-02-13 ENCOUNTER — Ambulatory Visit (INDEPENDENT_AMBULATORY_CARE_PROVIDER_SITE_OTHER): Payer: Medicare Other | Admitting: Surgical

## 2015-02-13 ENCOUNTER — Telehealth: Payer: Self-pay

## 2015-02-13 DIAGNOSIS — H6123 Impacted cerumen, bilateral: Secondary | ICD-10-CM

## 2015-02-13 DIAGNOSIS — Z89422 Acquired absence of other left toe(s): Secondary | ICD-10-CM | POA: Diagnosis not present

## 2015-02-13 DIAGNOSIS — L97521 Non-pressure chronic ulcer of other part of left foot limited to breakdown of skin: Secondary | ICD-10-CM | POA: Diagnosis not present

## 2015-02-13 DIAGNOSIS — E1042 Type 1 diabetes mellitus with diabetic polyneuropathy: Secondary | ICD-10-CM | POA: Diagnosis not present

## 2015-02-13 MED ORDER — BENZONATATE 100 MG PO CAPS: 200.0000 mg | ORAL_CAPSULE | Freq: Three times a day (TID) | ORAL | Status: DC | PRN

## 2015-02-13 NOTE — Progress Notes (Signed)
Patient came in for Bil Ear Irrigation. Cleaned both ears without any complications and patient tolerated well.

## 2015-02-13 NOTE — Telephone Encounter (Signed)
tessalon perles sent in

## 2015-02-13 NOTE — Telephone Encounter (Signed)
Patient called the triage line, complaints of a horrible cough, Please advise, requesting a prescription for Cough syrup be called in.

## 2015-02-13 NOTE — Addendum Note (Signed)
Addended by: [name] on: 02/13/2015 01:15 PM   Modules accepted: Orders

## 2015-02-16 NOTE — Telephone Encounter (Signed)
Faxed script for diabetic shoes as patient requested,

## 2015-02-19 IMAGING — US US THORACENTESIS ASP PLEURAL SPACE W/IMG GUIDE
1 series · 5 of 5 positions shown · non-contrast
Comparison: None

CLINICAL DATA: Pulmonary nodule, shortness of breath, right-sided
pleural effusion.  Request for thoracentesis.

ULTRASOUND GUIDED right THORACENTESIS

[Series 1: us thoracentesis asp pleural space w/img guide · 0.28mm/px · 5 of 5 slices shown]
[im 1/5]
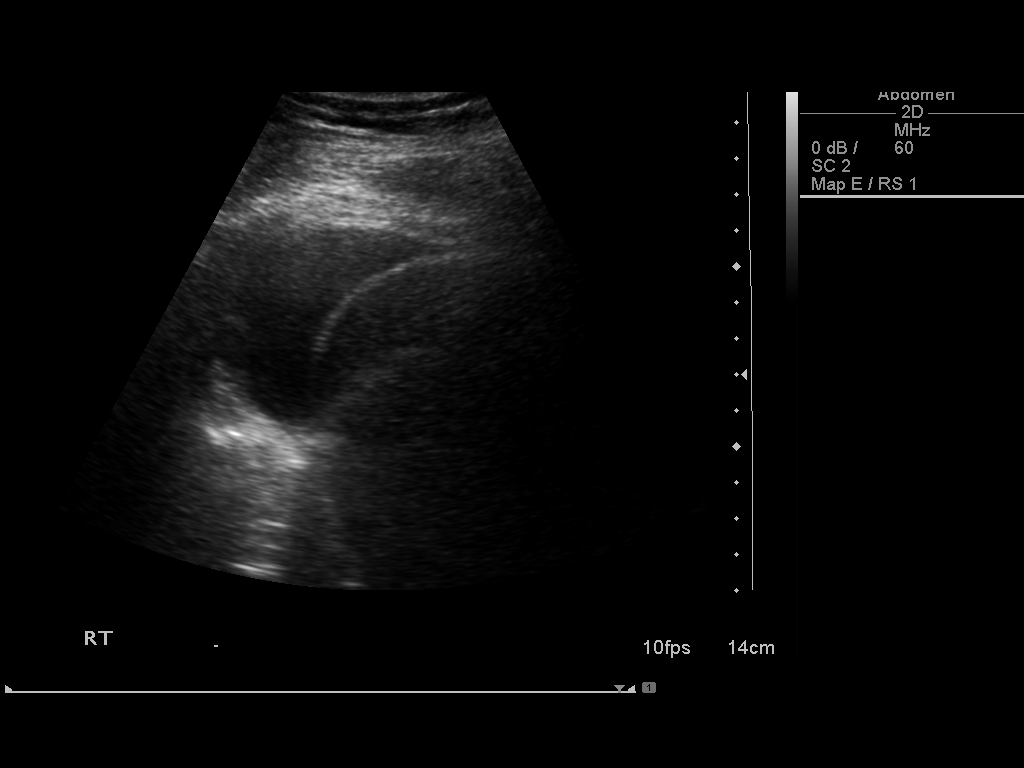
[im 2/5]
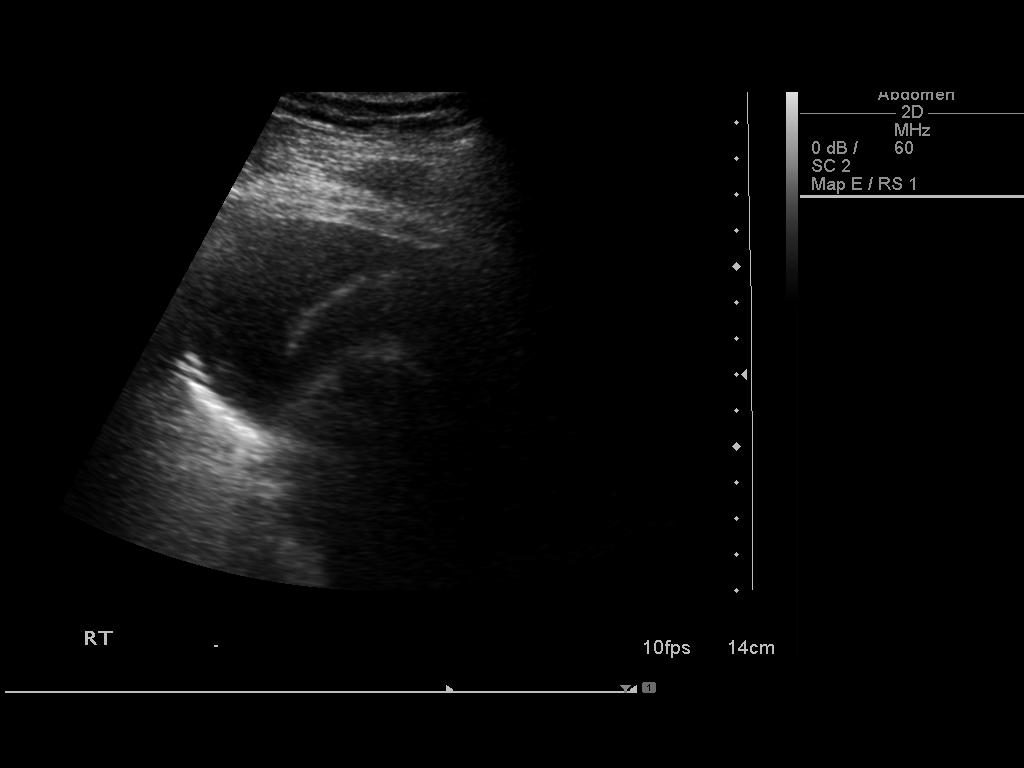
[im 3/5]
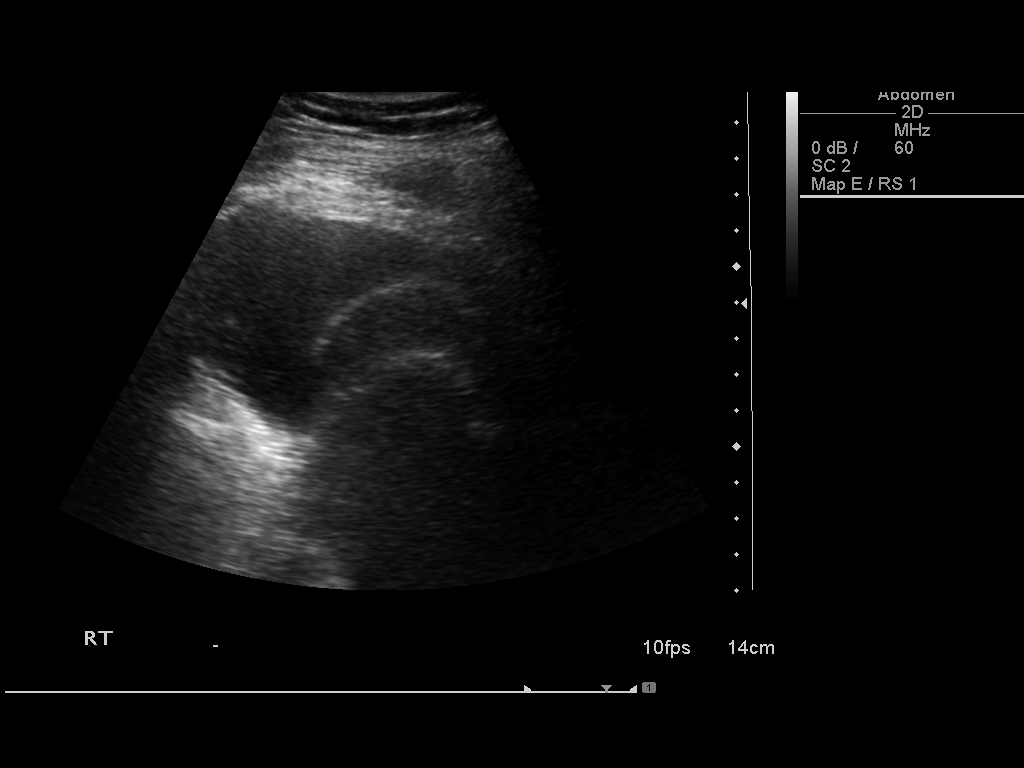
[im 4/5]
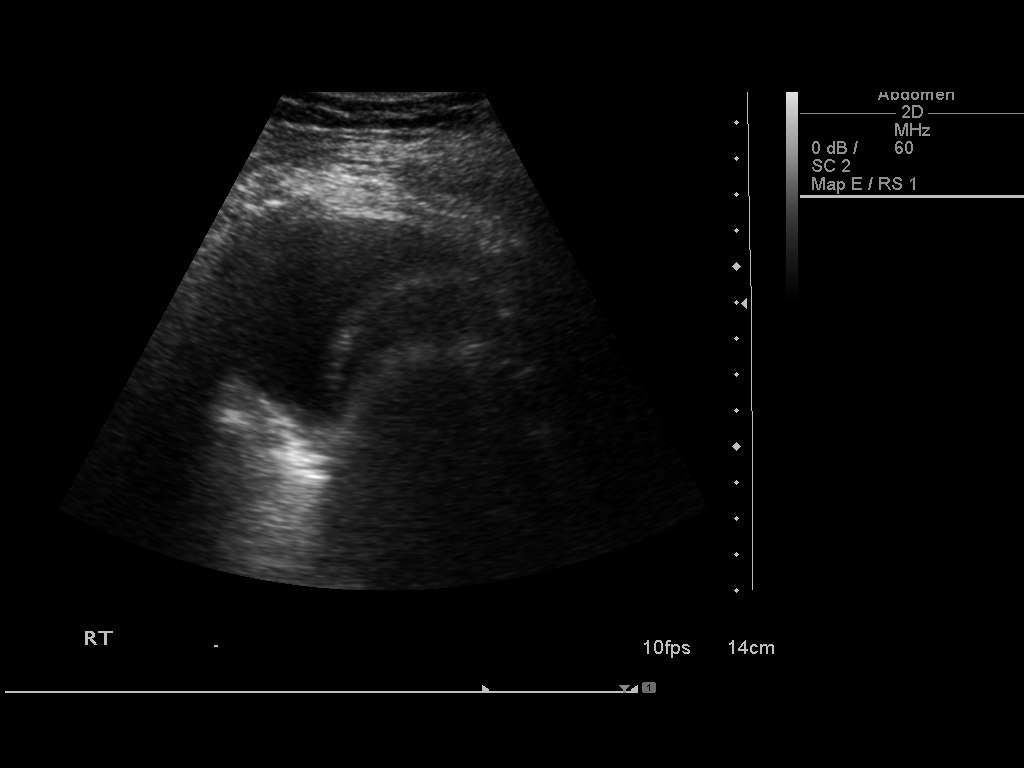
[im 5/5]
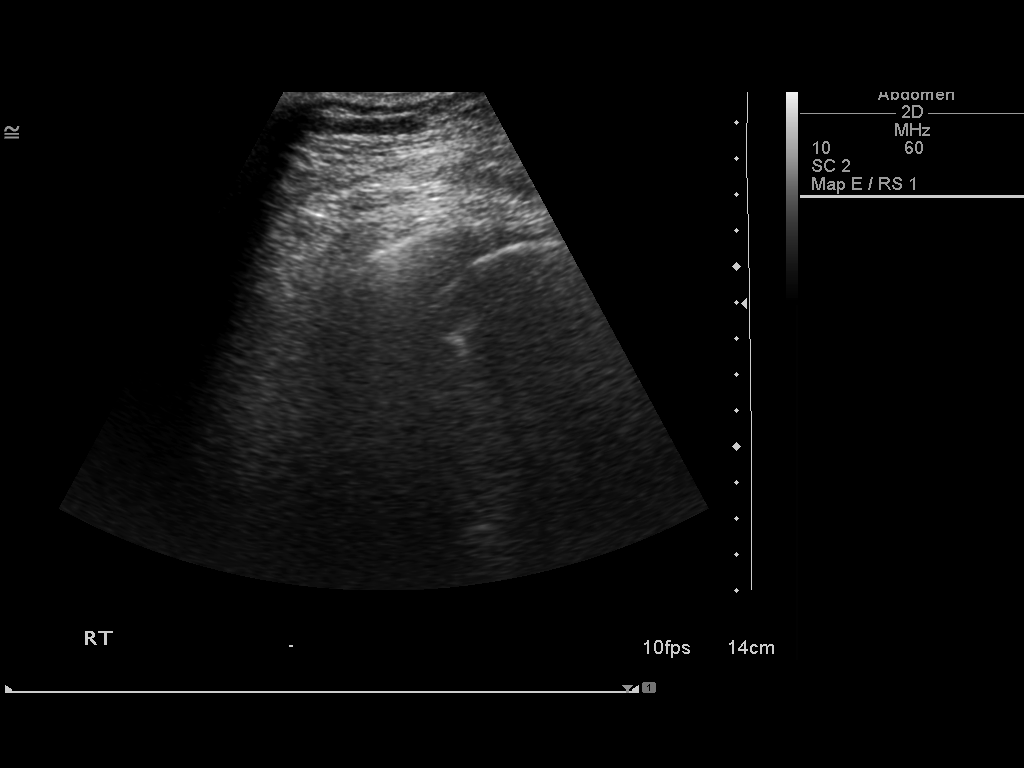

[5 of 5 positions shown; findings below may reference images not displayed]

An ultrasound guided thoracentesis was thoroughly discussed with
the patient and questions answered.  The benefits, risks,
alternatives and complications were also discussed.  The patient
understands and wishes to proceed with the procedure.  Written
consent was obtained.

Ultrasound was performed to localize and mark an adequate pocket of
fluid in the right chest.  The area was then prepped and draped in
the normal sterile fashion.  1% Lidocaine was used for local
anesthesia.  Under ultrasound guidance a 19 gauge Yueh catheter was
introduced.  Thoracentesis was performed.  The catheter was removed
and a dressing applied.

Complications:  None immediate
FINDINGS: A total of approximately 200 ml of cloudy whitish yellow
fluid was removed. A fluid sample was sent for laboratory analysis.
IMPRESSION: Successful ultrasound guided right thoracentesis yielding 200 ml of
pleural fluid.

Read by Angel Efrain Chinachi

## 2015-02-19 IMAGING — CR DG CHEST 1V
1 series · 1 of 1 positions shown · non-contrast
Comparison: None.

CLINICAL DATA: Status post right thoracentesis.

CHEST - 1 VIEW

[w chest pa]
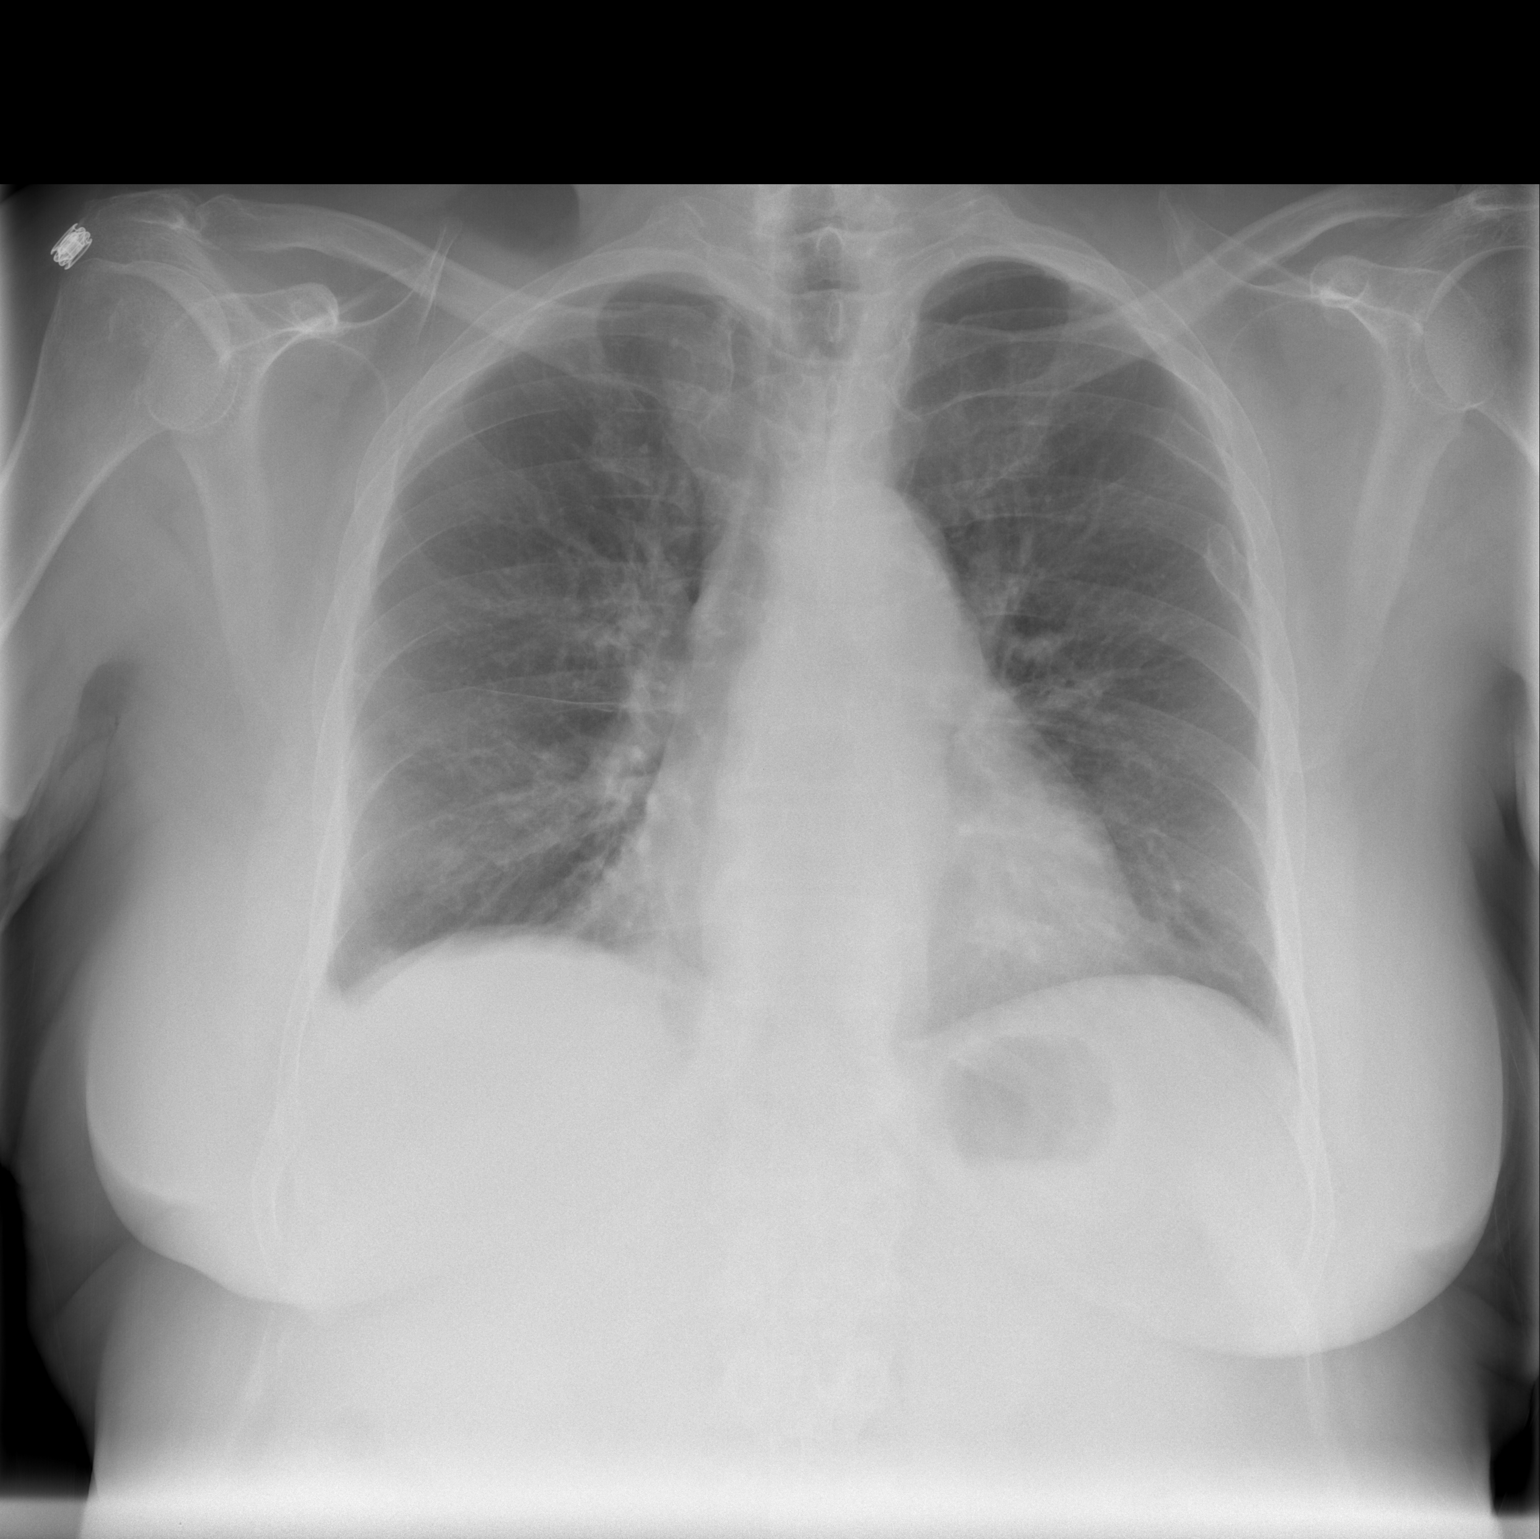

[1 of 1 positions shown; findings below may reference images not displayed]

FINDINGS: Trace right pleural effusion is identified after
thoracentesis.  No pneumothorax.  The left lung is expanded and
clear.  Remote left rib fractures are noted.  Heart size is normal.
IMPRESSION: Negative for pneumothorax after thoracentesis.  Trace right pleural
effusion noted.

## 2015-02-21 ENCOUNTER — Ambulatory Visit (INDEPENDENT_AMBULATORY_CARE_PROVIDER_SITE_OTHER): Payer: Medicare Other | Admitting: Internal Medicine

## 2015-02-21 ENCOUNTER — Encounter: Payer: Self-pay | Admitting: Internal Medicine

## 2015-02-21 VITALS — BP 124/60 | HR 63 | Temp 98.0°F | Resp 12 | Ht 63.0 in | Wt 192.8 lb

## 2015-02-21 DIAGNOSIS — E785 Hyperlipidemia, unspecified: Secondary | ICD-10-CM

## 2015-02-21 DIAGNOSIS — I2510 Atherosclerotic heart disease of native coronary artery without angina pectoris: Secondary | ICD-10-CM

## 2015-02-21 DIAGNOSIS — Z23 Encounter for immunization: Secondary | ICD-10-CM

## 2015-02-21 DIAGNOSIS — I5022 Chronic systolic (congestive) heart failure: Secondary | ICD-10-CM | POA: Diagnosis not present

## 2015-02-21 DIAGNOSIS — Z79899 Other long term (current) drug therapy: Secondary | ICD-10-CM | POA: Diagnosis not present

## 2015-02-21 DIAGNOSIS — E1342 Other specified diabetes mellitus with diabetic polyneuropathy: Secondary | ICD-10-CM

## 2015-02-21 DIAGNOSIS — E1140 Type 2 diabetes mellitus with diabetic neuropathy, unspecified: Secondary | ICD-10-CM | POA: Insufficient documentation

## 2015-02-21 DIAGNOSIS — I255 Ischemic cardiomyopathy: Secondary | ICD-10-CM | POA: Diagnosis not present

## 2015-02-21 DIAGNOSIS — E1351 Other specified diabetes mellitus with diabetic peripheral angiopathy without gangrene: Secondary | ICD-10-CM

## 2015-02-21 NOTE — Progress Notes (Signed)
Pre-visit discussion using our clinic review tool. No additional management support is needed unless otherwise documented below in the visit note.  

## 2015-02-21 NOTE — Patient Instructions (Signed)
Your diabetes remains under excellent control  And your cholesterol and other labs are also normal. Please continue your current medications. return in 6 months for follow up on diabetes and make sure you are seeing your eye doctor at least once a year.  Referral to [facility] in [place] and to Vascular Surgery  Are both underway  i will see you after the New Year, unless you have an urgent problem before that

## 2015-02-21 NOTE — Progress Notes (Signed)
Subjective:  Patient ID: Pamela Collier, female    DOB: [birth year]  Age: 74 y.o. MRN: [id number]  CC: The primary encounter diagnosis was Arteriosclerosis of coronary artery. 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  HPI Pamela Collier presents for follow up on multiple issues:  COPD exacerbation:  She was treated with doxycycline on 9/30. Chest x ray was clear .  Symptoms have resolved. Sunday had rhinitis and decreased appetite ,  No cough.   Has been Talking in her sleep. Denies nightmares, sleepwalking.      Has some morning dizziness when she first wakes up,  But he symptoms are gone by breakfast. .  Occurs when walking , using a cane for security.  She has no history of falls.  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  \wants to see VASCULAR IN [place].  Wants a new cardiologist bc of the recent episode   Se[facility]   Outpatient Prescriptions Prior to Visit  Medication Sig Dispense Refill  . albuterol (PROVENTIL) (2.5 MG/3ML) 0.083% nebulizer solution Take 3 mLs (2.5 mg total) by nebulization every 6 (six) hours as needed for wheezing or shortness of breath. 150 mL 1  . alendronate (FOSAMAX) 70 MG tablet Take with a full glass of water on an empty stomach every Sunday  Take with a full glass of water and take no other meds or food for   30 minutes . 12 tablet 1  . ALPRAZolam (XANAX) 0.5 MG tablet Take 1 tablet (0.5 mg total) by mouth daily as needed for sleep or anxiety. 90 tablet 0  . aspirin 81 MG EC tablet Take 1 tablet (81 mg total) by mouth daily. Swallow whole. 30  tablet 12  . benzonatate (TESSALON) 100 MG capsule Take 2 capsules (200 mg total) by mouth 3 (three) times daily as needed for cough. 90 capsule 0  . Blood Glucose Monitoring Suppl (TRUERESULT BLOOD GLUCOSE) W/DEVICE KIT 1 kit by Does not apply route 3 (three) times daily. 1 each 0  . BRILINTA 90 MG TABS tablet Take 1 tablet (90 mg total) by mouth 2 (two) times daily. 180 tablet 2  . COMBIVENT RESPIMAT 20-100 MCG/ACT AERS respimat Inhale 1 puff into the lungs 3 (three) times daily as needed. 2 Inhaler 3  . doxycycline (VIBRAMYCIN) 100 MG capsule Take 1 capsule (100 mg total) by mouth 2 (two) times daily. 14 capsule 0  . fluticasone (FLONASE) 50 MCG/ACT nasal spray Place 2 sprays into both nostrils daily. 16 g 2  . furosemide (LASIX) 20 MG tablet Take 1 tablet (20 mg total) by mouth daily. 90 tablet 1  . glucose blood (TRUETEST TEST) test strip CHECK BLOOD GLUCOSE THREE TIMES DAILY Dx E11.29 100 each 11  . insulin aspart protamine - aspart (NOVOLOG 70/30 MIX) (70-30) 100 UNIT/ML FlexPen 60 units before breakfast,  60 units before dinner.  NEEDS 15 PENS PER MONTH 105 mL 2  . Insulin Pen Needle (B-D UF III MINI PEN NEEDLES) 31G X 5 MM MISC Use  Three times daily 300 each 3  . isosorbide mononitrate (IMDUR) 30 MG 24 hr tablet Take 1 tablet (  30 mg total) by mouth daily. 90 tablet 1  . Lactulose 20 GM/30ML SOLN 30 ml every 4 hours until constipation is relieved 236 mL 3  . losartan (COZAAR) 100 MG tablet Take 1 tablet (100 mg total) by mouth daily. 90 tablet 2  . meloxicam (MOBIC) 15 MG tablet Take 1 tablet (15 mg total) by mouth daily. 90 tablet 1  . metoprolol tartrate (LOPRESSOR) 25 MG tablet Take 0.5 tablets (12.5 mg total) by mouth 2 (two) times daily. 180 tablet 1  . PARoxetine (PAXIL) 10 MG tablet Take 1 tablet (10 mg total) by mouth daily. 90 tablet 1  . pregabalin (LYRICA) 100 MG capsule Take 1 capsule (100 mg total) by mouth 3 (three) times daily. 270 capsule 3  . rosuvastatin (CRESTOR) 20 MG  tablet Take 1 tablet (20 mg total) by mouth daily. 90 tablet 1  . sodium polystyrene (KAYEXALATE) powder     . traMADol (ULTRAM) 50 MG tablet Take 1 tablet (50 mg total) by mouth every 6 (six) hours as needed for moderate pain. 120 tablet 3  . TRUEPLUS LANCETS 30G MISC USE TO CHECK GLUCOSE THREE TIMES DAILY 400 each 0  . Vitamin D, Ergocalciferol, (DRISDOL) 50000 UNITS CAPS capsule Take 50,000 Units by mouth every 7 (seven) days.     Facility-Administered Medications Prior to Visit  Medication Dose Route Frequency Provider Last Rate Last Dose  . albuterol (PROVENTIL) (5 MG/ML) 0.5% nebulizer solution 2.5 mg  2.5 mg Nebulization Once [name], MD        Review of Systems;  Patient denies headache, fevers, malaise, unintentional weight loss, skin rash, eye pain, sinus congestion and sinus pain, sore throat, dysphagia,  hemoptysis , cough, dyspnea, wheezing, chest pain, palpitations, orthopnea, edema, abdominal pain, nausea, melena, diarrhea, constipation, flank pain, dysuria, hematuria, urinary  Frequency, nocturia, numbness, tingling, seizures,  Focal weakness, Loss of consciousness,  Tremor, insomnia, depression, anxiety, and suicidal ideation.      Objective:  BP 124/60 mmHg  Pulse 63  Temp(Src) 98 F (36.7 C) (Oral)  Resp 12  Ht _0  (1.6 m)  Wt 192 lb 12 oz (87.431 kg)  BMI 34.15 kg/m2  SpO2 96%  BP Readings from Last 3 Encounters:  02/23/15 118/66  02/21/15 124/60  02/09/15 148/70    Wt Readings from Last 3 Encounters:  02/23/15 192 lb 9.6 oz (87.363 kg)  02/21/15 192 lb 12 oz (87.431 kg)  02/09/15 194 lb 2 oz (88.055 kg)    General appearance: alert, cooperative and appears stated age Ears: normal TM's and external ear canals both ears Throat: lips, mucosa, and tongue normal; teeth and gums normal Neck: no adenopathy, no carotid bruit, supple, symmetrical, trachea midline and thyroid not enlarged, symmetric, no tenderness/mass/nodules Back: symmetric, no  curvature. ROM normal. No CVA tenderness. Lungs: clear to auscultation bilaterally Heart: regular rate and rhythm, S1, S2 normal, no murmur, click, rub or gallop Abdomen: soft, non-tender; bowel sounds normal; no masses,  no organomegaly Pulses: 2+ and symmetric Skin: Skin color, texture, turgor normal. No rashes or lesions Lymph nodes: Cervical, supraclavicular, and axillary nodes normal.  Lab Results  Component Value Date   HGBA1C 7.4* 02/09/2015   HGBA1C 8.1* 10/03/2014   HGBA1C 8.4* 06/29/2014    Lab Results  Component Value Date   CREATININE 1.40* 02/09/2015   CREATININE 1.14* 01/11/2015   CREATININE 1.54* 11/02/2014    Lab Results  Component Value Date   WBC 7.5 02/09/2015   HGB 11.0* 02/09/2015  HCT 33.5* 02/09/2015   PLT 259 02/09/2015   GLUCOSE 134* 02/09/2015   CHOL 159 02/23/2014   TRIG 165.0* 02/23/2014   HDL 27.90* 02/23/2014   LDLDIRECT 73.0 10/03/2014   LDLCALC 98 02/23/2014   ALT 13 02/09/2015   AST 18 02/09/2015   NA 141 02/09/2015   K 4.9 02/09/2015   CL 105 02/09/2015   CREATININE 1.40* 02/09/2015   BUN 30* 02/09/2015   CO2 23 02/09/2015   TSH 1.16 07/01/2013   INR 1.2* 02/02/2015   HGBA1C 7.4* 02/09/2015   MICROALBUR 36.4* 02/09/2015    Dg Chest 2 View  02/09/2015  CLINICAL DATA:  Cough, weakness, shortness of Breath. EXAM: CHEST  2 VIEW COMPARISON:  01/11/2015 FINDINGS: Heart is normal size. Aorta is normal caliber. No confluent airspace opacities or effusions. No acute bony abnormality. Old left rib fractures. IMPRESSION: No active cardiopulmonary disease. Electronically Signed   By: Rolm Baptise M.D.   On: 02/09/2015 16:58    Assessment & Plan:   Problem List Items Addressed This Visit    Double vessel coronary artery disease    She has a history of prior PTCA/stent x 2, recent stress test negative for reversible ischemia ,  But underwent PTCA/stent in late April by Dr Alba Destine in [facility] when she presented with respiratory distress.   Records now available.  Brillenta prescribed.  patient has requested a change in cardiology .   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 She is requesting change in vascular surgery follow up,  Referral to [facility] Oneida Alar in [facility] pending           Relevant Orders   Ambulatory referral to Vascular Surgery   Diabetic neuropathy [facility])    She has no feeling in her feet.  No ulcers are seen today.  callouses are noted.  Continue podiatry follow up .      Relevant Orders   Hemoglobin A1c   Microalbumin / creatinine urine ratio   CCF (congestive cardiac failure) (HCC)   Arteriosclerosis of coronary artery - Primary   Relevant Orders   Ambulatory referral to Cardiology   Hyperlipidemia LDL goal <100   Relevant Orders   LDL cholesterol, direct   Lipid panel   Long-term use of high-risk medication   Relevant Orders   Comprehensive metabolic panel   CBC with Differential/Platelet    Other Visit Diagnoses    Encounter for immunization           I am having Pamela Collier maintain her aspirin, TRUERESULT BLOOD GLUCOSE, Lactulose, TRUEPLUS LANCETS 30G, Vitamin D (Ergocalciferol), glucose blood, sodium polystyrene, alendronate, ALPRAZolam, furosemide, Insulin Pen Needle, isosorbide mononitrate, losartan, meloxicam, rosuvastatin, traMADol, insulin aspart protamine - aspart, BRILINTA, fluticasone, pregabalin, metoprolol tartrate, PARoxetine, COMBIVENT RESPIMAT, albuterol, doxycycline, and benzonatate.  No orders of the defined types were placed in this encounter.    There are no discontinued medications.  Follow-up: Return in about 3 months (around 05/24/2015) for follow up  diabetes.   [name], MD

## 2015-02-23 ENCOUNTER — Other Ambulatory Visit: Payer: Self-pay | Admitting: Pharmacist

## 2015-02-23 ENCOUNTER — Ambulatory Visit (INDEPENDENT_AMBULATORY_CARE_PROVIDER_SITE_OTHER): Payer: Medicare Other | Admitting: Pulmonary Disease

## 2015-02-23 ENCOUNTER — Encounter: Payer: Self-pay | Admitting: Pulmonary Disease

## 2015-02-23 VITALS — BP 118/66 | HR 54 | Ht 63.0 in | Wt 192.6 lb

## 2015-02-23 DIAGNOSIS — I255 Ischemic cardiomyopathy: Secondary | ICD-10-CM | POA: Diagnosis not present

## 2015-02-23 DIAGNOSIS — J90 Pleural effusion, not elsewhere classified: Secondary | ICD-10-CM

## 2015-02-23 DIAGNOSIS — R911 Solitary pulmonary nodule: Secondary | ICD-10-CM | POA: Diagnosis not present

## 2015-02-23 DIAGNOSIS — D860 Sarcoidosis of lung: Secondary | ICD-10-CM

## 2015-02-23 MED ORDER — BUDESONIDE-FORMOTEROL FUMARATE 160-4.5 MCG/ACT IN AERO: 2.0000 | INHALATION_SPRAY | Freq: Two times a day (BID) | RESPIRATORY_TRACT | Status: DC

## 2015-02-23 MED ORDER — AEROCHAMBER MV MISC: Status: DC

## 2015-02-23 NOTE — Patient Instructions (Signed)
Take Symbicort 2 puffs twice a day no matter how you feel, use it with the spacer we gave you When you were taking the Symbicort, stop taking the Combivent Continue to use albuterol as needed for shortness of breath After you have use the Symbicort for a few weeks, call us to let us know if you think that it is helping and we will then send in a prescription We will order a CT scan of your chest 6 months from now and then see you after that

## 2015-02-23 NOTE — Assessment & Plan Note (Signed)
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 This was seen again on her CT chest in September, but a follow-up ultrasound did not show sufficient fluid for thoracentesis. We've performed thoracentesis a couple times and there is never been evidence of malignancy. In fact I performed an ultrasound of her chest myself and did not see evidence of a significant effusion. Because this has been relatively stable and there is been no other sign of malignancy I think the best approach is just continued surveillance for now. If she does have a repeat thoracentesis then I think she needs to have it sent for flow cytometry but fortunately it has not grown. 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

## 2015-02-23 NOTE — Progress Notes (Signed)
Subjective:    Patient ID: Pamela Collier, female    DOB: 1941/04/13, 74 y.o.   MRN: [id number]  Synopsis: This is a 74 year old female first seen by Dr. Melvyn Novas in the [facility] pulmonary office in January 2014 for pulmonary nodule and some shortness of breath. An ACE inhibitor was held during that visit and followup imaging was planned.  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 She had coronary artery disease diagnosed in 2016 while visiting family in [place] and she had a stent placed in her right coronary artery. She has been followed off and on in the pulmonary clinic since 2014 for a waxing and waning chylothorax in the right lung as well as mediastinal lymphadenopathy which was felt to be consistent with her sarcoid.  HPI Chief Complaint  Patient presents with  . Follow-up    pt. states breathing is better. no SOB. occ. wheezing. occ. prod. cough clear in color. no chest pain/tightness. pain in lt. upper back x22mo    Pamela Collier that her breathing has been OK lately.  She has an occassional wheeze but the albuterol helps.  She uses it 2-3 times a week at most.  She had more wheezing back in the summer time. She has ischemic cardiomoypathy and [place]  She is supposed to have a new cardiologist see her soon. She got lost today on her way to the office.  She can[place] drive in [place] She had to go to the ER in September after a colonoscopy.  She said that she had to stop taking one over medications and she ended up in the ER there.  She had a trouble breathing and ended up having a CT scan of her chest.   Past Medical History  Diagnosis Date  . Diabetes mellitus   . 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 Cardiomyopathy, ischemic Jan 2013    EF 35 to 40% ([facility]  . Hepatic steatosis     by CT abd pelvis  . Osteoporosis, post-menopausal   .  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 Atherosclerotic peripheral vascular disease with gangrene ([facility] august 2012  . Hypertension   . Hyperlipidemia   . Sarcoidosis ([place]   . CHF (congestive heart failure) ([place]   . Renal insufficiency   . 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 CCF (congestive cardiac failure) ([place] 08/17/2013  . Pleural effusion 10/25/2012    10/2012 CT chest >> small to moderate R lung effusion>> chylothorax, 100% lymphs 10/2013 thoracentesis> cytology negative, WBC 1471, > 90% "small lymphs" 01/2014 CT chest> near complete resolution of pleural effusion, stable lymphadenopathy 08/2014 CT chest [place]): small right sided effusion decreased in size, stable mediastinal lymphadenopathy 1.0cm largest, 227m. Pulmonary sarcoidosis ([facility]7/29/2014    [place] 03/2013 Full PFT [facility] > UNACCEPTABLE AND NOT REPRODUCIBLE DATA> Ratio 71% FEV 1 1.02 L (55% pred), FVC 1.31 L (49% pred) could not do lung volumes or DLCO   . Acute respiratory failure ([place]/04/2015  . Diabetic diarrhea ([place]5/24/2016  . DM type 2, uncontrolled, with renal complications ([facility]/09/12/2704. Cerebral infarct ([facility]4/12/2013  . Arthritis  08/05/2013  . 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 Hyperlipidemia LDL goal <100 02/23/2014   Review of Systems  Constitutional: Negative for fever, chills and fatigue.  HENT: Negative for congestion, postnasal drip and rhinorrhea.   Respiratory: Positive for shortness of breath and wheezing. Negative for cough.   Cardiovascular: Negative for chest pain, palpitations and leg swelling.       Objective:   Physical Exam  Filed Vitals:   02/23/15 1006  BP: 118/66  Pulse: 54  Height: 5' 3"  (1.6 m)  Weight: 192 lb 9.6 oz (87.363 kg)  SpO2: 100%  RA  Gen: well appearing, no acute  distress HEENT: NCAT, EOMi, OP clear, PULM: CTA B CV: RRR, no mgr, no JVD AB: BS+, soft, nontender, no hsm Ext: warm, no edema, no clubbing, no cyanosis  January 2014 CXR [facility] reviewed by me with vascular congestion and cardiomegally, no mass.  Incidental finding on contrasted chest CT done to rule out pulmonary embolus, [facility] CT chest May 06 2012  RLL as dz RUL ll mm nodule Mulitple LN's some partially calcified 10/22/2012 CT chest >> 11 mm right middle lobe nodule unchanged, borderline enlarged mediastinal lymphadenopathy unchanged, there is a right pleural effusion which is new, there is atelectasis in the right base 03/2013 CT chest > slightly larger R pleural effusion, new trace left pleural effusion, RUL nodule unchanged 12x70m; RLL atelectasis; calcified lymphadenopathy 05/2012 PFT> poor quality, ratio 78%, FEV1 1.02 (55%pred), FVC 1.3 (48% pred) 01/2014 CT chest > near complete resolution of the pleural effusion, stable lymphadenopathy May 2016 stool microbiology results reviewed, cultures negative, C. difficile report unavailable September 2016 CT chest demonstrates small pleural effusion on the right side, interval growth of one right lower lobe nodule which is now 5 mm in size, stable 1.3 cm right upper lobe nodule, some atelectasis and volume loss in the right lower lobe, mediastinal adenopathy unchanged September 2016 ultrasound chest showed an adequate size of effusion for thoracentesis ER records from September 2016 reviewed her she was seen for shortness of breath, had a CT scan of the chest as detailed above.     Assessment & Plan:   Cardiomyopathy, ischemic She has ischemic cardiomyopathy with an LVEF of 35-40%. [place] Today on exam she appears euvolemic. She is about to change cardiology doctors.  Plan: Continue beta blocker, Lasix, and ARB for now.  Pulmonary nodule seen on imaging study I have personally reviewed the  images from her CT chest when she was in the emergency room on September 1. This showed a small pleural effusion, slight growth of a right lower lobe nodule to 5 mm, stable lymphadenopathy and a stable right upper lobe nodule which is 1.3 cm in size. I feel that these nodular changes are likely somehow related to her sarcoidosis and fortunately none of them have grown to a worrisome degree. However, there was slight growth in the 5 mm nodule in the right lower lobe. Because of her somewhat unexplained chylothorax (I think it's most likely related to her sarcoidosis and previous mediastinal surgery) I think it's best to continue surveillance of the nodules for now.  Plan: Repeat CT chest in 07/11/2015 and then follow-up with me afterwards  Pleural effusion As stated above she has a chylous effusion. This was seen again on her CT chest in September, but a follow-up ultrasound did not show sufficient fluid for thoracentesis. We've performed thoracentesis a couple times and there is never  been evidence of malignancy. In fact I performed an ultrasound of her chest myself and did not see evidence of a significant effusion. Because this has been relatively stable and there is been no other sign of malignancy I think the best approach is just continued surveillance for now. If she does have a repeat thoracentesis then I think she needs to have it sent for flow cytometry but fortunately it has not grown. 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  Pulmonary sarcoidosis ([place]) She has moderate airflow obstruction and some mild restrictive disease seen on her pulmonary function test from 2014. There is often some degree of overlap between asthma and sarcoidosis and she continues to complain of wheezing requiring rescue use of nebulized albuterol several times per week. I'm going to give her a trial of an inhaled corticosteroid instead of the  Combivent to see if that helps improve the wheezing. Fortunately this has not been a severe problem for her.  Flu shot up-to-date  Plan: Trial of Symbicort 2 puffs twice a day, hold Combivent during this time She will call me and let me know if the SymbicortI his neck seems to be helping     Updated Medication List Outpatient Encounter Prescriptions as of 02/23/2015  Medication Sig  . albuterol (PROVENTIL) (2.5 MG/3ML) 0.083% nebulizer solution Take 3 mLs (2.5 mg total) by nebulization every 6 (six) hours as needed for wheezing or shortness of breath.  [name] alendronate (FOSAMAX) 70 MG tablet Take with a full glass of water on an empty stomach every Sunday  Take with a full glass of water and take no other meds or food for   30 minutes .  [name] ALPRAZolam (XANAX) 0.5 MG tablet Take 1 tablet (0.5 mg total) by mouth daily as needed for sleep or anxiety.  [name] aspirin 81 MG EC tablet Take 1 tablet (81 mg total) by mouth daily. Swallow whole.  . benzonatate (TESSALON) 100 MG capsule Take 2 capsules (200 mg total) by mouth 3 (three) times daily as needed for cough.  . Blood Glucose Monitoring Suppl (TRUERESULT BLOOD GLUCOSE) W/DEVICE KIT 1 kit by Does not apply route 3 (three) times daily.  [name] BRILINTA 90 MG TABS tablet Take 1 tablet (90 mg total) by mouth 2 (two) times daily.  . COMBIVENT RESPIMAT 20-100 MCG/ACT AERS respimat Inhale 1 puff into the lungs 3 (three) times daily as needed.  . doxycycline (VIBRAMYCIN) 100 MG capsule Take 1 capsule (100 mg total) by mouth 2 (two) times daily.  . fluticasone (FLONASE) 50 MCG/ACT nasal spray Place 2 sprays into both nostrils daily.  . furosemide (LASIX) 20 MG tablet Take 1 tablet (20 mg total) by mouth daily.  [name] glucose blood (TRUETEST TEST) test strip CHECK BLOOD GLUCOSE THREE TIMES DAILY Dx E11.29  . insulin aspart protamine - aspart (NOVOLOG 70/30 MIX) (70-30) 100 UNIT/ML FlexPen 60 units before breakfast,  60 units before dinner.  NEEDS 15 PENS PER MONTH  .  Insulin Pen Needle (B-D UF III MINI PEN NEEDLES) 31G X 5 MM MISC Use  Three times daily  . isosorbide mononitrate (IMDUR) 30 MG 24 hr tablet Take 1 tablet (30 mg total) by mouth daily.  . Lactulose 20 GM/30ML SOLN 30 ml every 4 hours until constipation is relieved  . losartan (COZAAR) 100 MG tablet Take 1 tablet (100 mg total) by mouth daily.  . meloxicam (MOBIC) 15 MG tablet Take 1 tablet (15  mg total) by mouth daily.  . metoprolol tartrate (LOPRESSOR) 25 MG tablet Take 0.5 tablets (12.5 mg total) by mouth 2 (two) times daily.  [name] PARoxetine (PAXIL) 10 MG tablet Take 1 tablet (10 mg total) by mouth daily.  . pregabalin (LYRICA) 100 MG capsule Take 1 capsule (100 mg total) by mouth 3 (three) times daily.  . rosuvastatin (CRESTOR) 20 MG tablet Take 1 tablet (20 mg total) by mouth daily.  . sodium polystyrene (KAYEXALATE) powder   . traMADol (ULTRAM) 50 MG tablet Take 1 tablet (50 mg total) by mouth every 6 (six) hours as needed for moderate pain.  . TRUEPLUS LANCETS 30G MISC USE TO CHECK GLUCOSE THREE TIMES DAILY  . Vitamin D, Ergocalciferol, (DRISDOL) 50000 UNITS CAPS capsule Take 50,000 Units by mouth every 7 (seven) days.  [name] Spacer/Aero-Holding Chambers (AEROCHAMBER MV) inhaler Use as instructed   Facility-Administered Encounter Medications as of 02/23/2015  Medication  . albuterol (PROVENTIL) (5 MG/ML) 0.5% nebulizer solution 2.5 mg

## 2015-02-23 NOTE — Assessment & Plan Note (Signed)
I have personally reviewed the images from her CT chest when she was in the emergency room on September 1. This showed a small pleural effusion, slight growth of a right lower lobe nodule to 5 mm, stable lymphadenopathy and a stable right upper lobe nodule which is 1.3 cm in size. I feel that these nodular changes are likely somehow related to her sarcoidosis and fortunately none of them have grown to a worrisome degree. However, there was slight growth in the 5 mm nodule in the right lower lobe. Because of her somewhat unexplained chylothorax (I think it's most likely related to her sarcoidosis and previous mediastinal surgery) I think it's best to continue surveillance of the nodules for now.  Plan: Repeat CT chest in 07/11/2015 and then follow-up with me afterwards

## 2015-02-23 NOTE — Addendum Note (Signed)
Addended by: Raymondo Band D on: 02/23/2015 10:56 AM   Modules accepted: Orders

## 2015-02-23 NOTE — Assessment & Plan Note (Signed)
She has moderate airflow obstruction and some mild restrictive disease seen on her pulmonary function test from 2014. There is often some degree of overlap between asthma and sarcoidosis and she continues to complain of wheezing requiring rescue use of nebulized albuterol several times per week. I'm going to give her a trial of an inhaled corticosteroid instead of the Combivent to see if that helps improve the wheezing. Fortunately this has not been a severe problem for her.  Flu shot up-to-date  Plan: Trial of Symbicort 2 puffs twice a day, hold Combivent during this time She will call me and let me know if the SymbicortI his neck seems to be helping

## 2015-02-23 NOTE — Assessment & Plan Note (Signed)
She has ischemic cardiomyopathy with an LVEF of 35-40%. She had a coronary stent placed in [place] this year. Today on exam she appears euvolemic. She is about to change cardiology doctors.  Plan: Continue beta blocker, Lasix, and ARB for now.

## 2015-02-23 NOTE — Patient Outreach (Signed)
Called to follow up with Mrs. Gilkerson. Left a HIPAA compliant message on the patient's voicemail. If have not heard from patient by 02/26/15, will give her another call at that time.  Harlow Asa, PharmD Clinical Pharmacist [facility] [phone]

## 2015-02-24 NOTE — Assessment & Plan Note (Signed)
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 She is requesting change in vascular surgery follow up,  Referral to Dr Oneida Alar in [facility] pending

## 2015-02-24 NOTE — Assessment & Plan Note (Signed)
She has a history of prior PTCA/stent x 2, recent stress test negative for reversible ischemia ,  But underwent PTCA/stent in late April by Dr Alba Destine in [facility] when she presented with respiratory distress.  Records now available.  Brillenta prescribed.  patient has requested a change in cardiology .   Continue ASA, lipitor, beta blocker, Imdur and ARB

## 2015-02-24 NOTE — Assessment & Plan Note (Signed)
Improved control .  No changes today . 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   Lab Results  Component Value Date   HGBA1C 7.4* 02/09/2015   Lab Results  Component Value Date   MICROALBUR 36.4* 02/09/2015   Lab Results  Component Value Date   CHOL 159 02/23/2014   HDL 27.90* 02/23/2014   LDLCALC 98 02/23/2014   LDLDIRECT 73.0 10/03/2014   TRIG 165.0* 02/23/2014   CHOLHDL 6 02/23/2014

## 2015-02-24 NOTE — Assessment & Plan Note (Signed)
She has no feeling in her feet.  No ulcers are seen today.  callouses are noted.  Continue podiatry follow up .

## 2015-02-26 ENCOUNTER — Other Ambulatory Visit: Payer: Self-pay | Admitting: Internal Medicine

## 2015-02-26 ENCOUNTER — Other Ambulatory Visit: Payer: Self-pay | Admitting: *Deleted

## 2015-02-26 MED ORDER — ACCU-CHEK AVIVA DEVI: Status: DC

## 2015-02-26 MED ORDER — ACCU-CHEK SOFTCLIX LANCET DEV MISC: Status: DC

## 2015-02-26 MED ORDER — GLUCOSE BLOOD VI STRP: ORAL_STRIP | Status: DC

## 2015-02-26 NOTE — Patient Outreach (Signed)
RNCM made an acute home visit to assist pt in using her new inhaler with an aerochamber. RNCM showed pt 2 separate teaching videos on the technique. Pt was able to demonstrate good technique. RNCM educated pt on the importance of rinsing mouth after inhaler. Pt voiced understanding. Pt also talked with RNCM about [employer] not carrying her test strips for her brand new [facility]. [facility] attempted to call [facility] but they had closed. Pt stated [facility] informed her she would have to get a brand new meter. RNCM gave pt her new scale to weigh on. RNCM set it up and placed new batteries in it. Reeducated pt about the importance of letting MD know if she was up 3 pounds in one day or 5 pounds in one week. Teach back method used and pt able to repeat back when to call the MD for weight gain. [place] [facility]-SW called RNCM while at pt's home and requested to plan a time to see pt. Pt agreeable for SW to come with RNCM this coming Friday for routine home visit.  Plan: RNCM and [facility] SW will see pt Friday.  RNCM will contact the [facility] tomorrow to inquire about where to obtain strips.  Rutherford Limerick RN, BSN  [facility] [phone])

## 2015-02-26 NOTE — Telephone Encounter (Signed)
Sent order for Glucometer and supplies as requested by pharmacy

## 2015-02-26 NOTE — Patient Outreach (Signed)
RNCM received a phone call from pt. Pt stated she had gotten a new inhaler from the dr and she could not understand how to use it. Pt described an inhaler with an aero chamber. RNCM attempted to talk pt through the process with out success. Pt requested RNCM come to her home and demonstrate meds use. RNCM told pt it would be 4p or after but she would come.  Plan: RNCM will go to pt's home and educate her on inhaler use this afternoon.   Rutherford Limerick RN, BSN  [facility] Management [phone])

## 2015-02-27 ENCOUNTER — Ambulatory Visit: Payer: Medicare Other | Admitting: Internal Medicine

## 2015-03-02 ENCOUNTER — Other Ambulatory Visit: Payer: Self-pay | Admitting: Internal Medicine

## 2015-03-02 ENCOUNTER — Other Ambulatory Visit: Payer: Self-pay | Admitting: Pharmacist

## 2015-03-02 ENCOUNTER — Other Ambulatory Visit: Payer: Self-pay | Admitting: Surgical

## 2015-03-02 ENCOUNTER — Encounter: Payer: Self-pay | Admitting: *Deleted

## 2015-03-02 ENCOUNTER — Other Ambulatory Visit: Payer: Self-pay | Admitting: *Deleted

## 2015-03-02 ENCOUNTER — Telehealth: Payer: Self-pay | Admitting: Internal Medicine

## 2015-03-02 DIAGNOSIS — R601 Generalized edema: Secondary | ICD-10-CM

## 2015-03-02 DIAGNOSIS — R0600 Dyspnea, unspecified: Secondary | ICD-10-CM

## 2015-03-02 MED ORDER — GLUCOSE BLOOD VI STRP: ORAL_STRIP | Status: DC

## 2015-03-02 NOTE — Patient Outreach (Signed)
[facility]) Care Management   03/02/2015  NATAYLA CADENHEAD July 18, 1940 [id number]  KENLIE SEKI is an 74 y.o. female  Subjective: " I am out of glucometer strips and [facility] said I can't get any more for the machine I have." "I have been doing my chair aerobic exercise tape." "Dr. Derrel Nip was pleased with my A1c being down to 7.4 at my last check." "I have been weighing every day and I have some weight gain."  Objective: Blood pressure 156/80, pulse 60, resp. rate 18, weight 192 lb (87.091 kg), SpO2 97 %.  Review of Systems  Respiratory: Negative for shortness of breath.   Cardiovascular: Negative for chest pain, orthopnea and leg swelling.  Gastrointestinal: Negative for nausea.  Musculoskeletal: Negative for falls.  Neurological: Positive for dizziness.    Physical Exam  Constitutional: She is oriented to person, place, and time. She appears well-developed and well-nourished.  Cardiovascular: Normal rate and regular rhythm.   Pulses:      Dorsalis pedis pulses are 1+ on the right side, and 1+ on the left side.       Posterior tibial pulses are 2+ on the right side, and 2+ on the left side.  Respiratory: Effort normal and breath sounds normal.  GI: Soft. Bowel sounds are normal.  Musculoskeletal: Normal range of motion.  Neurological: She is alert and oriented to person, place, and time.  Skin: Skin is warm and dry.  Psychiatric: She has a normal mood and affect.    Current Medications:   Current Outpatient Prescriptions  Medication Sig Dispense Refill  . albuterol (PROVENTIL) (2.5 MG/3ML) 0.083% nebulizer solution Take 3 mLs (2.5 mg total) by nebulization every 6 (six) hours as needed for wheezing or shortness of breath. 150 mL 1  . alendronate (FOSAMAX) 70 MG tablet Take with a full glass of water on an empty stomach every Sunday  Take with a full glass of water and take no other meds or food for   30 minutes . 12 tablet 1  . ALPRAZolam (XANAX) 0.5 MG  tablet Take 1 tablet (0.5 mg total) by mouth daily as needed for sleep or anxiety. 90 tablet 0  . aspirin 81 MG EC tablet Take 1 tablet (81 mg total) by mouth daily. Swallow whole. 30 tablet 12  . benzonatate (TESSALON) 100 MG capsule Take 2 capsules (200 mg total) by mouth 3 (three) times daily as needed for cough. 90 capsule 0  . BRILINTA 90 MG TABS tablet Take 1 tablet (90 mg total) by mouth 2 (two) times daily. 180 tablet 2  . budesonide-formoterol (SYMBICORT) 160-4.5 MCG/ACT inhaler Inhale 2 puffs into the lungs 2 (two) times daily. 1 Inhaler 0  . fluticasone (FLONASE) 50 MCG/ACT nasal spray Place 2 sprays into both nostrils daily. 16 g 2  . furosemide (LASIX) 20 MG tablet Take 1 tablet (20 mg total) by mouth daily. 90 tablet 1  . glucose blood (ACCU-CHEK AVIVA) test strip Use as instructed 100 each 11  . insulin aspart protamine - aspart (NOVOLOG 70/30 MIX) (70-30) 100 UNIT/ML FlexPen 60 units before breakfast,  60 units before dinner.  NEEDS 15 PENS PER MONTH 105 mL 2  . Insulin Pen Needle (B-D UF III MINI PEN NEEDLES) 31G X 5 MM MISC Use  Three times daily 300 each 3  . isosorbide mononitrate (IMDUR) 30 MG 24 hr tablet Take 1 tablet (30 mg total) by mouth daily. 90 tablet 1  . Lancet Devices (ACCU-CHEK SOFTCLIX) lancets Use  as instructed 1 each 11  . losartan (COZAAR) 100 MG tablet Take 1 tablet (100 mg total) by mouth daily. 90 tablet 2  . meloxicam (MOBIC) 15 MG tablet Take 1 tablet (15 mg total) by mouth daily. 90 tablet 1  . metoprolol tartrate (LOPRESSOR) 25 MG tablet Take 0.5 tablets (12.5 mg total) by mouth 2 (two) times daily. 180 tablet 1  . PARoxetine (PAXIL) 10 MG tablet Take 1 tablet (10 mg total) by mouth daily. 90 tablet 1  . pregabalin (LYRICA) 100 MG capsule Take 1 capsule (100 mg total) by mouth 3 (three) times daily. 270 capsule 3  . rosuvastatin (CRESTOR) 20 MG tablet Take 1 tablet (20 mg total) by mouth daily. 90 tablet 1  . Spacer/Aero-Holding Chambers (AEROCHAMBER MV)  inhaler Use as instructed 1 each 0  . Blood Glucose Monitoring Suppl (ACCU-CHEK AVIVA) device Use as instructed 1 each 0  . Blood Glucose Monitoring Suppl (TRUERESULT BLOOD GLUCOSE) W/DEVICE KIT 1 kit by Does not apply route 3 (three) times daily. (Patient not taking: Reported on 03/02/2015) 1 each 0  . COMBIVENT RESPIMAT 20-100 MCG/ACT AERS respimat Inhale 1 puff into the lungs 3 (three) times daily as needed. (Patient not taking: Reported on 03/02/2015) 2 Inhaler 3  . doxycycline (VIBRAMYCIN) 100 MG capsule Take 1 capsule (100 mg total) by mouth 2 (two) times daily. (Patient not taking: Reported on 03/02/2015) 14 capsule 0  . Lactulose 20 GM/30ML SOLN 30 ml every 4 hours until constipation is relieved (Patient not taking: Reported on 03/02/2015) 236 mL 3  . sodium polystyrene (KAYEXALATE) powder     . traMADol (ULTRAM) 50 MG tablet Take 1 tablet (50 mg total) by mouth every 6 (six) hours as needed for moderate pain. (Patient not taking: Reported on 03/02/2015) 120 tablet 3  . TRUEPLUS LANCETS 30G MISC USE TO CHECK GLUCOSE THREE TIMES DAILY (Patient not taking: Reported on 03/02/2015) 400 each 0  . Vitamin D, Ergocalciferol, (DRISDOL) 50000 UNITS CAPS capsule Take 50,000 Units by mouth every 7 (seven) days.     No current facility-administered medications for this visit.   Facility-Administered Medications Ordered in Other Visits  Medication Dose Route Frequency Provider Last Rate Last Dose  . albuterol (PROVENTIL) (5 MG/ML) 0.5% nebulizer solution 2.5 mg  2.5 mg Nebulization Once [name], MD        Functional Status:   In your present state of health, do you have any difficulty performing the following activities: 10/20/2014  Hearing? N  Vision? N  Difficulty concentrating or making decisions? N  Walking or climbing stairs? Y  Dressing or bathing? N  Doing errands, shopping? N  Preparing Food and eating ? N  Using the Toilet? N  In the past six months, have you accidently leaked  urine? N  Do you have problems with loss of bowel control? N  Managing your Medications? N  Managing your Finances? Y  Housekeeping or managing your Housekeeping? Y    Fall/Depression Screening:    PHQ 2/9 Scores 10/30/2014 10/30/2014 10/20/2014 07/23/2014 11/22/2013 08/03/2013 04/03/2012  PHQ - 2 Score 0 0 _0 0 2  PHQ- 9 Score - - 5 - 3 - 10  Exception Documentation - - - Other- indicate reason in comment box - - -    Assessment: Co-visit with [facility] SW. RNCM spoke with Helene Kelp at [facility] related to pt's new glucometer and strips. Helene Kelp stating she was able to fill script for new meter but was  awaiting clarification on strips and lancets. [facility] pharmacist also in touch with MD office to make them aware of pt's need for a new meter.   RNCM reviewed pt weights from weight log and noted pt had gained 5lbs in one week. RNCM made pt aware and notified MD. MD gave new order for pt to increase lasix until weight down the 5lbs but no more than 1 week. MD also advised pt to come to office for lab work on Monday 10/24. Teach back method used to confirm pt's understanding of Md's instructions. RNCM reeducated pt on signs and symptoms of worsening hf.   RNCM watched pt use new inhaler with aerochamber and assisted pt with better technique.   [facility]-SW assisted pt and spouse in completing advanced directives.   Plan: RNCM plans to see pt in one month.  Sharel Behne RN, BSN  [facility] Care Management [phone])  [facility] CM Care Plan Problem One        Most Recent Value   Care Plan Problem One  Pt would like to improve health to be able to enjoy and participate in a family beach trip in September.   Role Documenting the Problem One  Care Management Coordinator   Care Plan for Problem One  Not Active   [facility] Long Term Goal (31-90 days)  Pt will not be admitted to the hospital in the next 90 days   [facility] Long Term Goal Start Date  10/20/14   [facility] Long Term Goal Met Date  01/31/15   Interventions for Problem  One Long Term Goal  Medication review done with pt. Education on heart healthy and diabetic diet. Education on the importance of exercise. [facility] calendar given and with HF, COPD and diabetic  teaching given.   [facility] CM Short Term Goal #1 (0-30 days)  Pt will be able to identify and count carbs in the next 30 days.    [facility] CM Short Term Goal #1 Start Date  10/20/14   [facility] CM Short Term Goal #1 Met Date  11/17/14   Interventions for Short Term Goal #1  EMMI on diabetic diet, carb Counting sheet. Assisted pt in signing up for diabetic teaching at [facility].   [facility] CM Short Term Goal #2 (0-30 days)  Pt will walk to park beside her home and exercise 3-4 times week for next 30 days    [facility] CM Short Term Goal #2 Start Date  11/17/14   [facility] CM Short Term Goal #2 Met Date  12/18/14   Interventions for Short Term Goal #2  Education on the benefits of exercise. Reinforced the benefits of exercise for diabetes and HF   [facility] CM Short Term Goal #3 (0-30 days)  Pt will weigh and record weights for the 30days, recognizing an increase of 3lbs in 1 day or 5lbs in 7 days as a reason to call the MD.   [facility] CM Short Term Goal #3 Start Date  11/17/14   [facility] CM Short Term Goal #3 Met Date  12/18/14   Interventions for Short Tern Goal #3  Pt educated on the importance of daily weights in heart failure. Reinforced the HF zones pt has on her refrigerator.   [facility] CM Short Term Goal #4 (0-30 days)  Pt will obtain and record b/p every week with home monitor for the next 30 days.   [facility] CM Short Term Goal #4 Start Date  12/18/14   [facility] CM Short Term Goal #4 Met Date  -- [pt  did not meet this goal/does not want to restart]   Interventions for Short Term Goal #4  RNCM placed todays b/p on the calendar and marked off each Monday for pt to put new readings. Discussed the importance of knowing her numbers and what normals are.   [facility] CM Short Term Goal #5 (0-30 days)  Pt will go through colonoscopy with out complication in the next 30 days.    [facility] CM Short Term Goal #5 Start Date  12/18/14   [facility] CM Short Term Goal #5 Met Date  01/31/15   Interventions for Short Term Goal #5  RNCM went over all instructions for colonoscopy with pt. RNCM marked on pt's calendar the different diets she was to take part in. RNCM wrote plainly the medications she was to stop and take on the procedure day. RNCM and pt went over the directions for the sure prep. RNCM talked with pt about being safe as a diabetic on a clear liquid diet.     [facility] CM Care Plan Problem Two        Most Recent Value   Care Plan Problem Two  Pt would like to be able to participate in more of her usual activities including crochet classes at [facility] center    Role Documenting the Problem Two  Care Management [place] for Problem Two  Active   Interventions for Problem Two Long Term Goal   RNCM talked with pt about her concerns, and made goals to increase  confidence in herself and increase her strength to participate.   THN Long Term Goal (31-90) days  Pt will be able to attend crochet class at the [place] [facility] before the next 90 days   THN Long Term Goal Start Date  01/31/15   [facility] CM Short Term Goal #1 (0-30 days)  Pt will make a f/u appointmnet with her pulmonologist in the next 30 days   [facility]N CM Short Term Goal #1 Start Date  01/31/15   [facility] CM Short Term Goal #1 Met Date   03/02/15   Interventions for Short Term Goal #2   RNCM inquired about pt's f/u appointment. Pt stated she had not made one as requested by provider. RNCM assisted pt in finding the number for pt and wrote a reminder to assist pt in remembering to call.    [facility]N CM Short Term Goal #2 (0-30 days)  Pt will increase her strength by starting to do chair exercise video she already has 2 times per week and walk in a store 1 time per week. for the next 30 days.   [facility]N CM Short Term Goal #2 Start Date  01/31/15   [facility] CM Short Term Goal #2 Met Date  03/02/15   Interventions for Short Term Goal #2   RNCM discussed with pt ways to increase her strength and have more confidence in her ability to be independant. RNCM assisted pt in a simple way to record days she was sucessful in exercising.     [facility]N CM Short Term Goal #3 (0-30 days)  Pt will recognize 3lb weight gin in one day and/or 5lb weight gain in 1 week and call MD to report in the next 30 days.   [facility] CM Short Term Goal #3 Start Date  03/02/15   Interventions for Short Term Goal #3  RNCM used pt weight log to show pt her weight gain of 5 pounds in one week. RNCM contacted MD for pt and  made pt aware of MD's reccomendations. RNCM reeducated pt on s/s of heart failure.   [facility] CM Short Term Goal #4 (0-30 days)  Pt will obtain new glucometer in the next 30 days related to current meter company went out of business.    [facility]N CM Short Term Goal #4 Start Date  03/02/15   Interventions of Short Term Goal #4  RNCM contacted insurance company and pharmacy to inquire about pt obtaining a covered meter. [facility] pharmacist also involved with assisting with obtaining new meter. RNCM gave pt enough strips to test BID for 4 more days until new meter could be obtained.

## 2015-03-02 NOTE — Telephone Encounter (Signed)
Pamela Collier called from Care management clinical pharmacist regarding pt her True results Blood sugar meter has been discontinued. Pt needs a new one called into pharmacy and strips something else besides that one. [name] number is [phone]. Thank You!

## 2015-03-02 NOTE — Patient Outreach (Signed)
Called Dr. Lupita Dawn office to let her know that the patient's current glucometer has been manufacturer discontinued and to ask that she send in a prescription for a new meter and test strips for the patient to [place].   Left a message for Dr. Derrel Nip. Will call Mrs. Mcniel back after hearing back from Dr. Lupita Dawn office.  Harlow Asa, PharmD Clinical Pharmacist [place] Management [phone]

## 2015-03-02 NOTE — Patient Outreach (Signed)
[facility]) Care Management  03/02/2015  Pamela Collier [birth year] [id number]   Co-visit with RNCM  to assist patient with completing her Advanced Directive.  Advanced Directive discussed with both patient and spouse and  assistance provided to complete the document.  Both patient and spouse informed that document will need to be notarized. Once notarized, copies will need to be given to her doctor and chosen health care power of attorney.    Patient's case to be closed to social work at this time.  Patient informed that she can call this social worker back if further assistance is needed.     [name] [facility] Care Management [phone]

## 2015-03-02 NOTE — Telephone Encounter (Signed)
error 

## 2015-03-02 NOTE — Patient Outreach (Signed)
Called to follow up with Pamela Collier. Mrs. Selke reports that she is doing Okay today, her breathing is doing better. Reports that she has been using the new Symbicort inhaler. Confirmed that patient is using 2 puffs twice daily and rinsing mouth out after use. Reports that she is currently using a sample inhaler given to her by her Pulmonologist and that she is to follow up with him in 2 weeks about how it is working.   Reports that she has been having trouble with getting test strips for her meter. Reports that she currently has the TrueResults meter and that [facility] tells her that they can no longer get the test strips in stock. Per the manufacturer website, [place], this meter has been discontinued by the manufacturer. Let [name] know this and let her know that I will reach out to her PCP, Dr. Derrel Nip, about sending in a prescription for new meter and test strips for the patient.   Reports that [facility] called her about sending her refills of her medications. Reports that they are scheduled to be there by 03/07/15. Reports that she has enough of her other medications for now. Reports that she has about a 15 day supply of her Lyrica left, but will order this through [place] next time as well. States that she did report to [facility] about the billing issue where they had charged her account without her permission.  Reports that she is having no difficulty with filling her pillbox. Confirms that she is wearing her glasses every time that she fills it.  Will call Dr. Lupita Dawn office now to let her know that the patient's current meter has been manufacturer discontinued and to ask that she send in a prescription for a new meter and test strips for the patient to [facility]. Will call Mrs. Kilgore back after speaking with Dr. Lupita Dawn office.  Harlow Asa, PharmD Clinical Pharmacist [place] Management [phone]

## 2015-03-05 ENCOUNTER — Other Ambulatory Visit (INDEPENDENT_AMBULATORY_CARE_PROVIDER_SITE_OTHER): Payer: Medicare Other

## 2015-03-05 ENCOUNTER — Other Ambulatory Visit: Payer: Self-pay | Admitting: *Deleted

## 2015-03-05 ENCOUNTER — Other Ambulatory Visit: Payer: Self-pay | Admitting: Pharmacist

## 2015-03-05 DIAGNOSIS — R0989 Other specified symptoms and signs involving the circulatory and respiratory systems: Secondary | ICD-10-CM

## 2015-03-05 DIAGNOSIS — Z79899 Other long term (current) drug therapy: Secondary | ICD-10-CM

## 2015-03-05 DIAGNOSIS — R0600 Dyspnea, unspecified: Secondary | ICD-10-CM | POA: Diagnosis not present

## 2015-03-05 DIAGNOSIS — I739 Peripheral vascular disease, unspecified: Secondary | ICD-10-CM

## 2015-03-05 DIAGNOSIS — E785 Hyperlipidemia, unspecified: Secondary | ICD-10-CM | POA: Diagnosis not present

## 2015-03-05 DIAGNOSIS — R601 Generalized edema: Secondary | ICD-10-CM

## 2015-03-05 DIAGNOSIS — E1342 Other specified diabetes mellitus with diabetic polyneuropathy: Secondary | ICD-10-CM

## 2015-03-05 LAB — COMPREHENSIVE METABOLIC PANEL
ALBUMIN: 3.8 g/dL (ref 3.5–5.2)
ALT: 15 U/L (ref 0–35)
AST: 17 U/L (ref 0–37)
Alkaline Phosphatase: 50 U/L (ref 39–117)
BUN: 36 mg/dL — AB (ref 6–23)
CHLORIDE: 105 meq/L (ref 96–112)
CO2: 27 mEq/L (ref 19–32)
CREATININE: 1.5 mg/dL — AB (ref 0.40–1.20)
Calcium: 9.8 mg/dL (ref 8.4–10.5)
GFR: 43.55 mL/min — ABNORMAL LOW (ref >60.00)
GLUCOSE: 160 mg/dL — AB (ref 70–99)
POTASSIUM: 4.8 meq/L (ref 3.5–5.1)
SODIUM: 140 meq/L (ref 135–145)
Total Bilirubin: 0.9 mg/dL (ref 0.2–1.2)
Total Protein: 7.4 g/dL (ref 6.0–8.3)

## 2015-03-05 LAB — BASIC METABOLIC PANEL
BUN: 36 mg/dL — AB (ref 6–23)
CALCIUM: 9.8 mg/dL (ref 8.4–10.5)
CO2: 27 mEq/L (ref 19–32)
Chloride: 105 mEq/L (ref 96–112)
Creatinine, Ser: 1.5 mg/dL — ABNORMAL HIGH (ref 0.40–1.20)
GFR: 43.55 mL/min — AB (ref >60.00)
Glucose, Bld: 160 mg/dL — ABNORMAL HIGH (ref 70–99)
POTASSIUM: 4.8 meq/L (ref 3.5–5.1)
Sodium: 140 mEq/L (ref 135–145)

## 2015-03-05 LAB — CBC WITH DIFFERENTIAL/PLATELET
BASOS PCT: 0.4 % (ref 0.0–3.0)
Basophils Absolute: 0 10*3/uL (ref 0.0–0.1)
EOS ABS: 0.1 10*3/uL (ref 0.0–0.7)
Eosinophils Relative: 1.4 % (ref 0.0–5.0)
HCT: 36.2 % (ref 36.0–46.0)
HEMOGLOBIN: 12 g/dL (ref 12.0–15.0)
LYMPHS ABS: 1.3 10*3/uL (ref 0.7–4.0)
Lymphocytes Relative: 12.7 % (ref 12.0–46.0)
MCHC: 33.1 g/dL (ref 30.0–36.0)
MCV: 88.2 fl (ref 78.0–100.0)
MONO ABS: 0.9 10*3/uL (ref 0.1–1.0)
Monocytes Relative: 9.1 % (ref 3.0–12.0)
Neutro Abs: 7.9 10*3/uL — ABNORMAL HIGH (ref 1.4–7.7)
Neutrophils Relative %: 76.4 % (ref 43.0–77.0)
PLATELETS: 274 10*3/uL (ref 150.0–400.0)
RBC: 4.11 Mil/uL (ref 3.87–5.11)
RDW: 15.4 % (ref 11.5–15.5)
WBC: 10.4 10*3/uL (ref 4.0–10.5)

## 2015-03-05 LAB — HEMOGLOBIN A1C: HEMOGLOBIN A1C: 7.5 % — AB (ref 4.6–6.5)

## 2015-03-05 LAB — LIPID PANEL
CHOL/HDL RATIO: 4
Cholesterol: 145 mg/dL (ref 0–200)
HDL: 33.4 mg/dL — AB (ref >39.00)
LDL CALC: 82 mg/dL (ref 0–99)
NONHDL: 111.95
TRIGLYCERIDES: 152 mg/dL — AB (ref 0.0–149.0)
VLDL: 30.4 mg/dL (ref 0.0–40.0)

## 2015-03-05 LAB — MICROALBUMIN / CREATININE URINE RATIO
Creatinine,U: 49.1 mg/dL
MICROALB UR: 11 mg/dL — AB (ref 0.0–1.9)
Microalb Creat Ratio: 22.4 mg/g (ref 0.0–30.0)

## 2015-03-05 LAB — BRAIN NATRIURETIC PEPTIDE: PRO B NATRI PEPTIDE: 211 pg/mL — AB (ref 0.0–100.0)

## 2015-03-05 LAB — LDL CHOLESTEROL, DIRECT: Direct LDL: 87 mg/dL

## 2015-03-05 NOTE — Patient Outreach (Signed)
Called Mrs. Franklin to confirm that they received a new glucometer prescription for her. Spoke with [name] and confirmed that the pharmacy received a new prescription for the meter, prescribed Accu-check Aviva Plus, and for test strips and that patient has no copay for either of these. Per Heather prescription for lancets was sent in without directions. Pharmacy will call again today to get the prescription sent in with directions.  Will call now to follow up with Mrs. Eakle.  Harlow Asa, PharmD Clinical Pharmacist [place] Management [phone]

## 2015-03-05 NOTE — Patient Outreach (Signed)
Called Mrs. Pamela Collier to let he know that I spoke with [name] at her [facility] and confirmed that the pharmacy received a new prescription for the meter, prescribed Accu-check Aviva Plus, and for test strips and that patient has no copay for either of these. Per Heather prescription for lancets was sent in without directions. Pharmacy will call again today to get the prescription sent in with directions.  Left a HIPAA compliant message on the patient's voicemail. If have not heard from patient by 03/07/15, will give her another call at that time.   Harlow Asa, PharmD Clinical Pharmacist [place] Management [phone]

## 2015-03-05 NOTE — Telephone Encounter (Signed)
Left a detailed message for Thane Edu had already submitted a new request to [facility] on 10/17

## 2015-03-06 ENCOUNTER — Other Ambulatory Visit: Payer: Self-pay

## 2015-03-06 MED ORDER — ACCU-CHEK SOFTCLIX LANCET DEV MISC: Status: DC

## 2015-03-07 ENCOUNTER — Other Ambulatory Visit: Payer: Self-pay | Admitting: Pharmacist

## 2015-03-07 ENCOUNTER — Other Ambulatory Visit: Payer: Self-pay | Admitting: *Deleted

## 2015-03-07 NOTE — Patient Outreach (Signed)
Called Mrs. Wyszynski again to let her know that I spoke with [name] at her [facility] and confirmed that the pharmacy received a new prescription for the meter, prescribed Accu-check Aviva Plus, and for test strips and that patient has no copay for either of these. Per Heather prescription for lancets was sent in without directions. Pharmacy will called again on Monday to get the prescription sent in with directions.  Left a HIPAA compliant message on the patient's voicemail. If have not heard from patient by 03/09/15, will give her another call at that time.   Harlow Asa, PharmD Clinical Pharmacist [place] Management [phone]

## 2015-03-07 NOTE — Patient Outreach (Signed)
RNCM called pt after receiving a voicemail asking for a return call. HIPPA compliant voicemail left.   Plan: Will await for a return call.   Rutherford Limerick RN, BSN  [facility] Care Management [phone])

## 2015-03-09 ENCOUNTER — Other Ambulatory Visit: Payer: Self-pay | Admitting: Pharmacist

## 2015-03-09 NOTE — Patient Outreach (Signed)
Called Mrs. Mungaray again to follow up with Mrs. Ganske.Left a HIPAA compliant message on the patient's voicemail. If have not heard from patient by 03/12/15, will give her another call at that time.   Harlow Asa, PharmD Clinical Pharmacist [place] Management [phone]

## 2015-03-16 ENCOUNTER — Other Ambulatory Visit: Payer: Self-pay | Admitting: Pharmacist

## 2015-03-16 NOTE — Patient Outreach (Signed)
Called to follow up with Mrs. Notch about getting her medications through mail order and about her new blood glucose machine. Reports that she has been out of the house a lot recently, her brother in law and sister in law have been sick and she has been helping to take care of them.   Reports that she has plenty of each of her medications. Reports that [facility] has sent just sent her her refills. Reports that she picked up her new meter from her [place]. Reports that she has no questions about using it.   Reports that she has no medication questions for me at this time. Let her know that I will stop following her for now, but to please call me with any future questions about her medications. Confirm that she has my phone number.   Harlow Asa, PharmD Clinical Pharmacist [place] Management [phone]

## 2015-03-18 IMAGING — RF DG ESOPHAGUS
12 of 15 series · 12 of 15 positions shown · non-contrast
Comparison: None

CLINICAL DATA: Dysphagia.

ESOPHAGUS/BARIUM SWALLOW/TABLET STUDY
Fluoroscopy Time: 2 minutes and 0 seconds

[Series 1: run · 1 of 1 slices shown (1 of 12)]
[im 1/1]
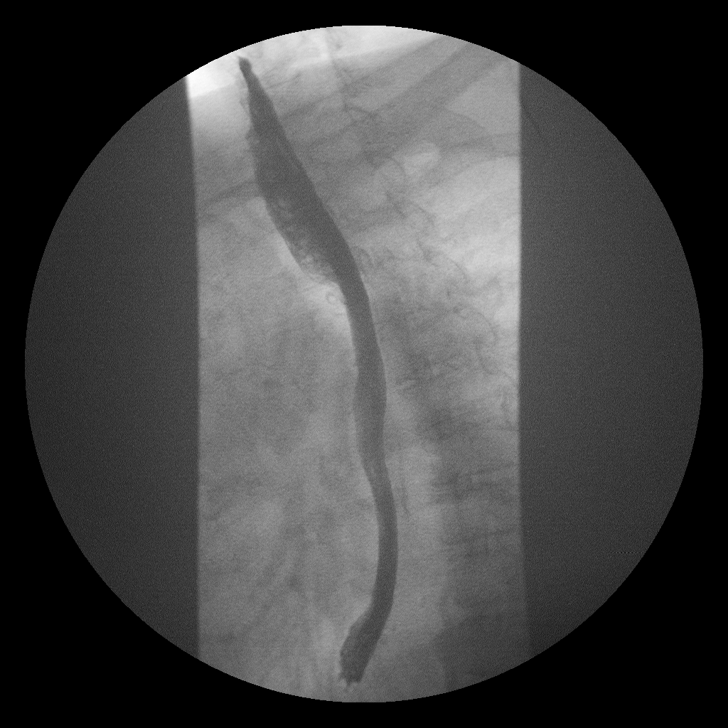

[Series 2: run · 1 of 1 slices shown (2 of 12)]
[im 1/1]
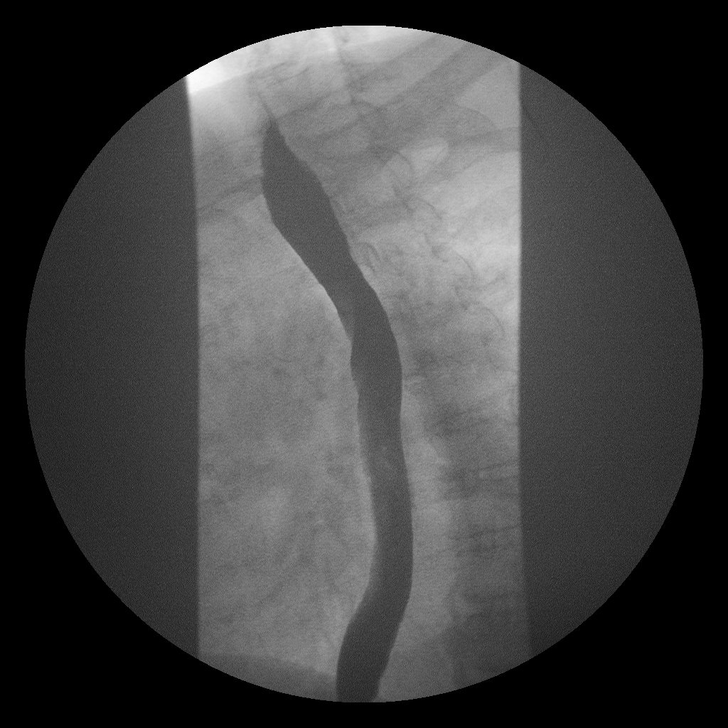

[Series 4: run · 1 of 1 slices shown (3 of 12)]
[im 1/1]
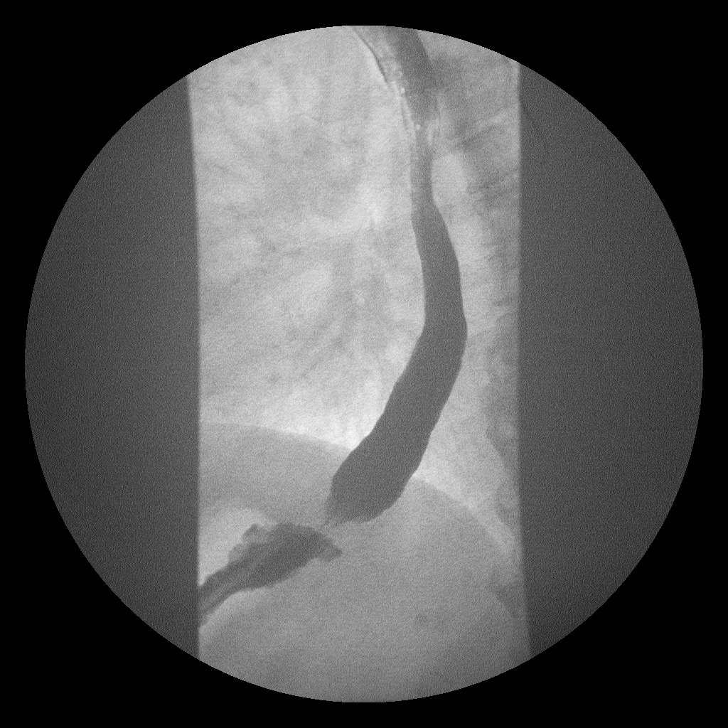

[Series 5: run · 1 of 1 slices shown (4 of 12)]
[im 1/1]
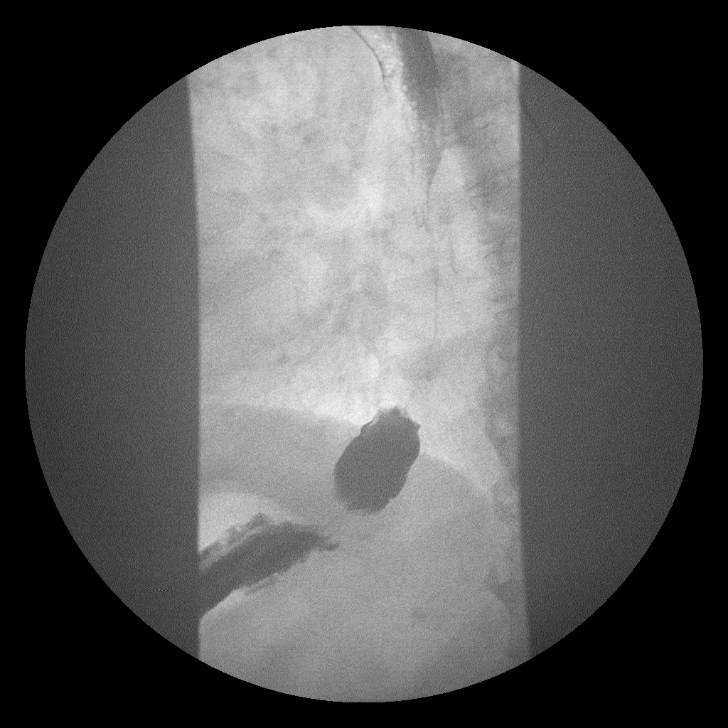

[Series 6: run · 1 of 1 slices shown (5 of 12)]
[im 1/1]
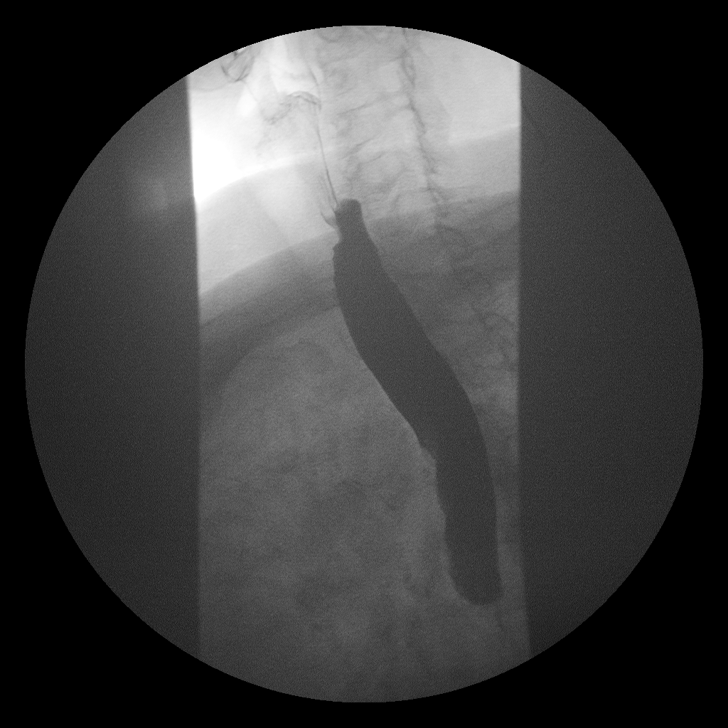

[Series 7: run · 1 of 1 slices shown (6 of 12)]
[im 1/1]
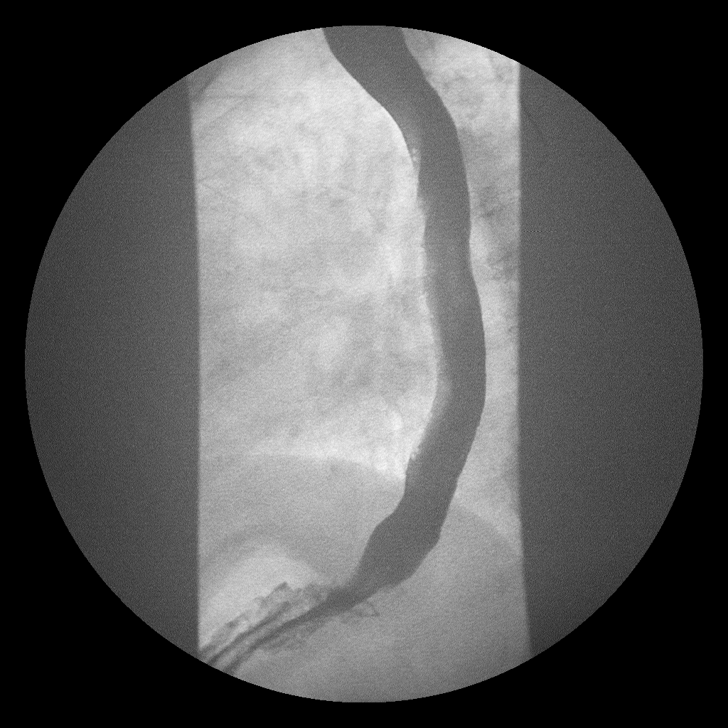

[Series 9: run · 1 of 1 slices shown (7 of 12)]
[im 1/1]
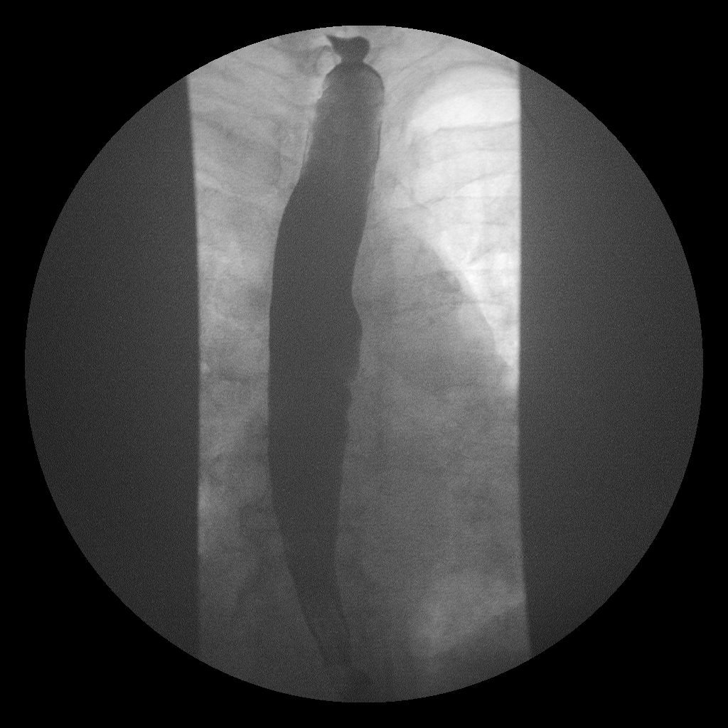

[Series 10: run · 1 of 1 slices shown (8 of 12)]
[im 1/1]
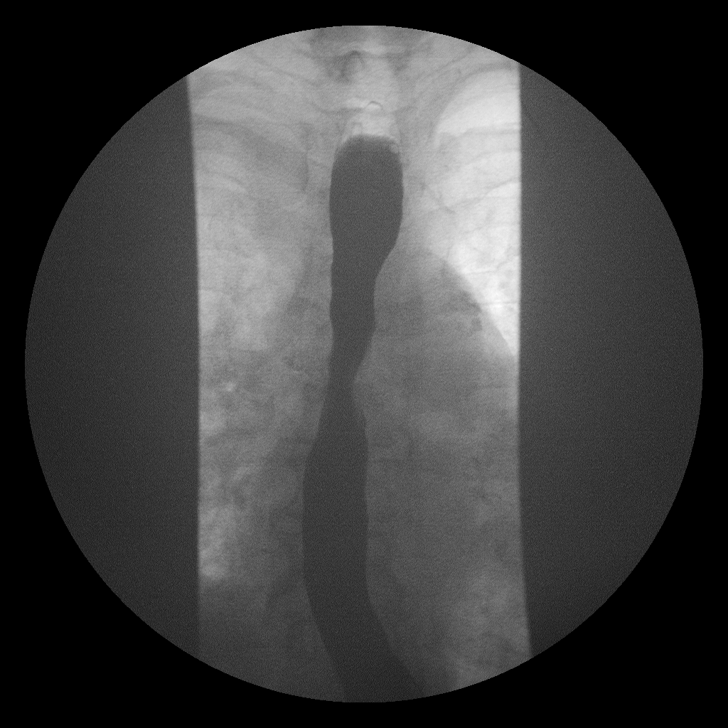

[Series 11: run · 1 of 1 slices shown (9 of 12)]
[im 1/1]
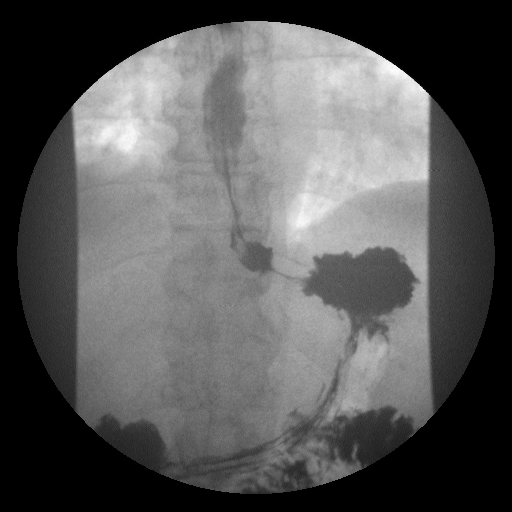

[Series 12: run · 1 of 1 slices shown (10 of 12)]
[im 1/1]
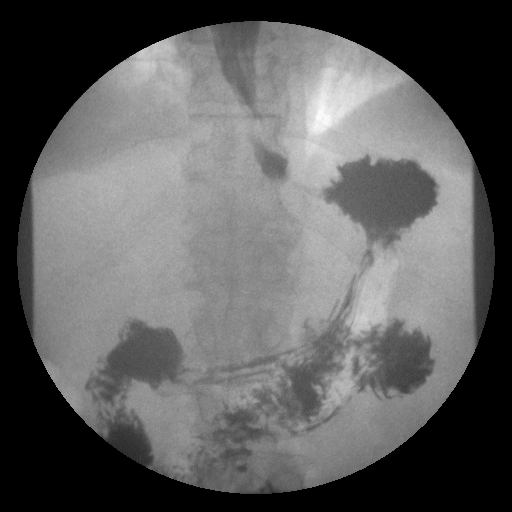

[Series 14: run · 1 of 1 slices shown (11 of 12)]
[im 1/1]
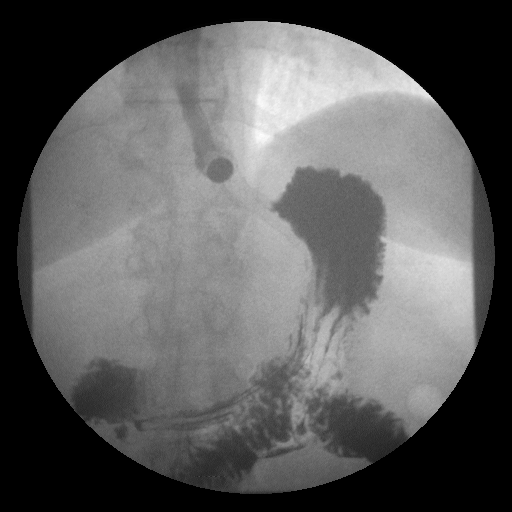

[Series 15: run · 1 of 1 slices shown (12 of 12)]
[im 1/1]
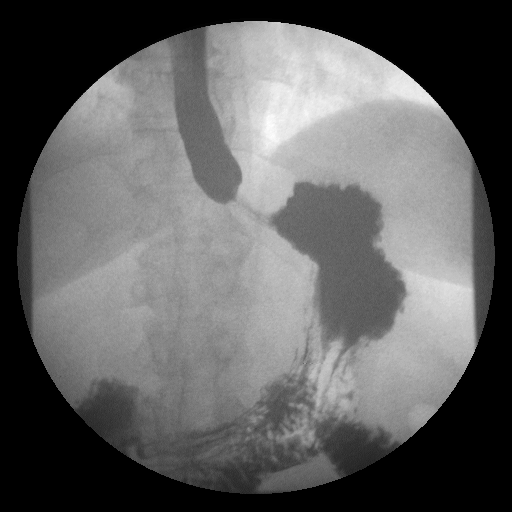

[12 of 15 positions shown; findings below may reference images not displayed]

FINDINGS: Nonspecific esophageal dysmotility with occasional
disruption of the primary peristaltic wave and occasional tertiary
contractions.  No intrinsic or extrinsic lesions of the esophagus
are identified and no mucosal abnormalities are seen.  There is a
smooth narrowing of the distal esophagus at the GE junction
consistent with a benign stricture.  A 13 mm barium pill was stuck
at this level for several minutes and eventually cleared with
swallowing of high density barium.

No hiatal hernia is demonstrated and no gastroesophageal reflux was
seen.
IMPRESSION: 1.  Nonspecific esophageal dysmotility.
2.  Distal narrowing of the esophagus near the GE junction.
3.  No hiatal hernia or demonstrable GE reflux.

## 2015-03-20 ENCOUNTER — Telehealth: Payer: Self-pay | Admitting: Pulmonary Disease

## 2015-03-20 DIAGNOSIS — G4733 Obstructive sleep apnea (adult) (pediatric): Secondary | ICD-10-CM

## 2015-03-20 NOTE — Telephone Encounter (Signed)
ATC NA WCB 

## 2015-03-21 NOTE — Telephone Encounter (Signed)
ATC PT NA, line rang numerous times, WCB

## 2015-03-22 NOTE — Telephone Encounter (Signed)
I'm happy to have the order of the CPAP supplies ordered under my name.

## 2015-03-22 NOTE — Telephone Encounter (Signed)
Spoke with patient - needing CPAP supplies ordered to [facility] Pt started on CPAP machine 11/2012 by Dr Lake Bells.  Has not been followed by Dr Lake Bells for sleep since 2015 - looks like patient was deferred to Dr Derrel Nip for OSA treatment. Pt states that her PCP Dr Derrel Nip does not follow her OSA and that she was advised by insurance that the order was to come from Dr Kathee Delton office.  Please advise BQ if you are okay with placing order for CPAP supplies to DME or if she needs to be set up with a Sleep Physician first. Pt states that she cannot afford another visit right now with our office for financial reasons but that she needs supplies asap.

## 2015-03-23 NOTE — Telephone Encounter (Signed)
Spoke with pt. She is aware that we will order her CPAP supplies. Nothing further was needed.

## 2015-04-02 ENCOUNTER — Ambulatory Visit: Payer: Self-pay | Admitting: *Deleted

## 2015-04-10 ENCOUNTER — Other Ambulatory Visit: Payer: Self-pay | Admitting: *Deleted

## 2015-04-10 ENCOUNTER — Encounter: Payer: Self-pay | Admitting: Vascular Surgery

## 2015-04-10 NOTE — Patient Outreach (Signed)
[facility]) Care Management   04/10/2015  Pamela Collier 19-Jul-1940 [id number]  Pamela Collier is an 74 y.o. female  Subjective: "I feel like I have been coughing more and my weight is up a little." " "When I go to my new cardiologist I have some questions for him."  Objective: .Blood pressure 120/62, pulse 60, resp. rate 18, height 1.676 m (_0 ), weight 193 lb (87.544 kg), SpO2 98 %.  Review of Systems  Respiratory: Positive for cough.   Cardiovascular: Negative for leg swelling.  Musculoskeletal: Negative for falls.     Physical Exam  Constitutional: She is oriented to person, place, and time. She appears well-developed and well-nourished.  Cardiovascular: Normal rate, regular rhythm and normal heart sounds.   Pulses:      Dorsalis pedis pulses are 1+ on the right side, and 1+ on the left side.  Respiratory: Effort normal and breath sounds normal.  GI: Soft. Bowel sounds are normal.  Neurological: She is alert and oriented to person, place, and time.  Skin: Skin is warm and dry.  Bilateral feet with skin intact.   Psychiatric: She has a normal mood and affect. Her speech is normal and behavior is normal. Judgment and thought content normal. Cognition and memory are normal.    Current Medications:   Current Outpatient Prescriptions  Medication Sig Dispense Refill  . albuterol (PROVENTIL) (2.5 MG/3ML) 0.083% nebulizer solution Take 3 mLs (2.5 mg total) by nebulization every 6 (six) hours as needed for wheezing or shortness of breath. 150 mL 1  . alendronate (FOSAMAX) 70 MG tablet Take with a full glass of water on an empty stomach every Sunday  Take with a full glass of water and take no other meds or food for   30 minutes . 12 tablet 1  . ALPRAZolam (XANAX) 0.5 MG tablet Take 1 tablet (0.5 mg total) by mouth daily as needed for sleep or anxiety. 90 tablet 0  . aspirin 81 MG EC tablet Take 1 tablet (81 mg total) by mouth daily. Swallow whole. 30 tablet 12   . benzonatate (TESSALON) 100 MG capsule Take 2 capsules (200 mg total) by mouth 3 (three) times daily as needed for cough. 90 capsule 0  . Blood Glucose Monitoring Suppl (ACCU-CHEK AVIVA) device Use as instructed 1 each 0  . BRILINTA 90 MG TABS tablet Take 1 tablet (90 mg total) by mouth 2 (two) times daily. 180 tablet 2  . budesonide-formoterol (SYMBICORT) 160-4.5 MCG/ACT inhaler Inhale 2 puffs into the lungs 2 (two) times daily. 1 Inhaler 0  . fluticasone (FLONASE) 50 MCG/ACT nasal spray Place 2 sprays into both nostrils daily. 16 g 2  . furosemide (LASIX) 20 MG tablet Take 1 tablet (20 mg total) by mouth daily. 90 tablet 1  . glucose blood (ACCU-CHEK AVIVA) test strip Use up to three times a day. 100 each 11  . insulin aspart protamine - aspart (NOVOLOG 70/30 MIX) (70-30) 100 UNIT/ML FlexPen 60 units before breakfast,  60 units before dinner.  NEEDS 15 PENS PER MONTH 105 mL 2  . Insulin Pen Needle (B-D UF III MINI PEN NEEDLES) 31G X 5 MM MISC Use  Three times daily 300 each 3  . isosorbide mononitrate (IMDUR) 30 MG 24 hr tablet Take 1 tablet (30 mg total) by mouth daily. 90 tablet 1  . Lancet Devices (ACCU-CHEK SOFTCLIX) lancets Utilize lancets three times a day to check blood glucose levels. 1 each 11  . losartan (COZAAR)  100 MG tablet Take 1 tablet (100 mg total) by mouth daily. 90 tablet 2  . meloxicam (MOBIC) 15 MG tablet Take 1 tablet (15 mg total) by mouth daily. 90 tablet 1  . metoprolol tartrate (LOPRESSOR) 25 MG tablet Take 0.5 tablets (12.5 mg total) by mouth 2 (two) times daily. 180 tablet 1  . PARoxetine (PAXIL) 10 MG tablet Take 1 tablet (10 mg total) by mouth daily. 90 tablet 1  . pregabalin (LYRICA) 100 MG capsule Take 1 capsule (100 mg total) by mouth 3 (three) times daily. 270 capsule 3  . rosuvastatin (CRESTOR) 20 MG tablet Take 1 tablet (20 mg total) by mouth daily. 90 tablet 1  . Spacer/Aero-Holding Chambers (AEROCHAMBER MV) inhaler Use as instructed 1 each 0  . Blood  Glucose Monitoring Suppl (TRUERESULT BLOOD GLUCOSE) W/DEVICE KIT 1 kit by Does not apply route 3 (three) times daily. (Patient not taking: Reported on 03/02/2015) 1 each 0  . COMBIVENT RESPIMAT 20-100 MCG/ACT AERS respimat Inhale 1 puff into the lungs 3 (three) times daily as needed. (Patient not taking: Reported on 03/02/2015) 2 Inhaler 3  . doxycycline (VIBRAMYCIN) 100 MG capsule Take 1 capsule (100 mg total) by mouth 2 (two) times daily. (Patient not taking: Reported on 03/02/2015) 14 capsule 0  . Lactulose 20 GM/30ML SOLN 30 ml every 4 hours until constipation is relieved (Patient not taking: Reported on 03/02/2015) 236 mL 3  . sodium polystyrene (KAYEXALATE) powder     . traMADol (ULTRAM) 50 MG tablet Take 1 tablet (50 mg total) by mouth every 6 (six) hours as needed for moderate pain. (Patient not taking: Reported on 03/02/2015) 120 tablet 3  . TRUEPLUS LANCETS 30G MISC USE TO CHECK GLUCOSE THREE TIMES DAILY (Patient not taking: Reported on 03/02/2015) 400 each 0  . Vitamin D, Ergocalciferol, (DRISDOL) 50000 UNITS CAPS capsule Take 50,000 Units by mouth every 7 (seven) days.     No current facility-administered medications for this visit.   Facility-Administered Medications Ordered in Other Visits  Medication Dose Route Frequency Provider Last Rate Last Dose  . albuterol (PROVENTIL) (5 MG/ML) 0.5% nebulizer solution 2.5 mg  2.5 mg Nebulization Once [name], MD        Functional Status:   In your present state of health, do you have any difficulty performing the following activities: 10/20/2014  Hearing? N  Vision? N  Difficulty concentrating or making decisions? N  Walking or climbing stairs? Y  Dressing or bathing? N  Doing errands, shopping? N  Preparing Food and eating ? N  Using the Toilet? N  In the past six months, have you accidently leaked urine? N  Do you have problems with loss of bowel control? N  Managing your Medications? N  Managing your Finances? Y   Housekeeping or managing your Housekeeping? Y    Fall/Depression Screening:    PHQ 2/9 Scores 10/30/2014 10/30/2014 10/20/2014 07/23/2014 11/22/2013 08/03/2013 04/03/2012  PHQ - 2 Score 0 0 _0 0 2  PHQ- 9 Score - - 5 - 3 - 10  Exception Documentation - - - Other- indicate reason in comment box - - -    Assessment: Alert and oriented x3.  RNCM assisted pt with paperwork for upcoming vein specialist appointment. RNCM talked with pt about the completion of her advanced directives and pt was confused on the next step in the process.   HF: Pt be up in weight according to home scale  3lbs, pt also c/o cough. Pt  stated MD had made a plan for her to take an extra lasix if weight was noted up and she planned to do this tonight. RNCM saw other incidences on weight log where pt was up 3 lbs and asked if pt had followed MD plan and had taken an extra fluid pill. Pt stated sometimes, but pt seemed to have trouble with the concept. In looking at pt's home weight log 190lbs was an average weight for pt. RNCM suggested to pt if weight got to 193 take her extra lasix and if weight got to 195 please call MD. All s/s of HF reviewed again with pt. Lungs clear, no wheezes noted. Denies nausea, loss of appetite or increased SOB.   DM: Averages were slightly increased. Pt stated this was related to the holidays. Pt plans to get back on track. 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 No sores to feet noted.   COPD: Lungs clear. Pt reports a cough that she is taking tessalon pearls for. Denies increase in sputum production. Pt using spiriva and nebulizer as instructed. Pt reports company just came out and serviced her cpap and provided her with a new mask.   RNCM discussed with pt being discharged to a health coach to continue reviewing HF care. Pt was in agreement with this plan, stating she needed a lot of review for things to stick.   Plan: RNCM will do one more home visit with plan to d/c pt to HF  health coach.   Rutherford Limerick RN, BSN  [facility] Care Management [phone])

## 2015-04-12 ENCOUNTER — Ambulatory Visit (HOSPITAL_COMMUNITY)
Admission: RE | Admit: 2015-04-12 | Discharge: 2015-04-12 | Disposition: A | Discharge status: Home / Self Care | Payer: Medicare Other | Source: Ambulatory Visit | Attending: Vascular Surgery | Admitting: Vascular Surgery

## 2015-04-12 ENCOUNTER — Encounter: Payer: Self-pay | Admitting: Vascular Surgery

## 2015-04-12 ENCOUNTER — Ambulatory Visit (INDEPENDENT_AMBULATORY_CARE_PROVIDER_SITE_OTHER): Payer: Medicare Other | Admitting: Vascular Surgery

## 2015-04-12 VITALS — BP 147/47 | HR 65 | Temp 98.0°F | Ht 66.0 in | Wt 194.3 lb

## 2015-04-12 DIAGNOSIS — R0989 Other specified symptoms and signs involving the circulatory and respiratory systems: Secondary | ICD-10-CM | POA: Insufficient documentation

## 2015-04-12 DIAGNOSIS — I739 Peripheral vascular disease, unspecified: Secondary | ICD-10-CM | POA: Insufficient documentation

## 2015-04-12 DIAGNOSIS — I255 Ischemic cardiomyopathy: Secondary | ICD-10-CM | POA: Diagnosis not present

## 2015-04-12 NOTE — Progress Notes (Signed)
[facility] [place] HISTORY AND PHYSICAL   History of Present Illness:  Pamela Collier is a 74 y.o. year old female who presents for evaluation of lower extremity peripheral arterial disease. The Pamela Collier previously had an angioplasty of her right posterior tibial artery in 2012 by Dr. Lucky Cowboy in [place]. The Pamela Collier had a contrast reaction with itching at that time and decided not to go back. She did have a toe amputation around that time as well. She denies any claudication symptoms. She does have pain in her calves and feet but this is present with her she is at rest or with walking. She also has bilateral numbness in her feet. She has had diabetes for greater than 40 years. She has neuropathy and takes Lyrica for this. She is only able to walk about 2 blocks before she becomes short of breath. She does not describe rest pain in the feet.  She does not have any recent history of nonhealing wounds. Other medical problems include coronary artery disease, cardiomyopathy last EF 35-40%, hepatic steatosis, hypertension hyperlipidemia sarcoid and renal insufficiency (serum creatinine 1.5  02/2015) all of which are currently stable.  Past Medical History  Diagnosis Date  . Diabetes mellitus   . 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 Cardiomyopathy, ischemic Jan 2013    EF 35 to 40% [facility])  . Hepatic steatosis     by CT abd pelvis  . Osteoporosis, post-menopausal   . 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 Atherosclerotic peripheral vascular disease with gangrene [facility]) august 2012  . Hypertension   . Hyperlipidemia   . Sarcoidosis ([place])   . CHF (congestive heart failure) ([place])   . Renal insufficiency   . 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 [facility] (congestive cardiac failure) ([place]) 08/17/2013  . Pleural effusion 10/25/2012    10/2012 CT chest  >> small to moderate R lung effusion>> chylothorax, 100% lymphs 10/2013 thoracentesis> cytology negative, WBC 1471, > 90% "small lymphs" 01/2014 CT chest> near complete resolution of pleural effusion, stable lymphadenopathy 08/2014 CT chest [place] ([facility]): small right sided effusion decreased in size, stable mediastinal lymphadenopathy 1.0cm largest, 22m . 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 Acute respiratory failure ([place] 11/21/2014  . Diabetic diarrhea ([place] 10/03/2014  . DM type 2, uncontrolled, with renal complications ([place] 81/10/1094 . Cerebral infarct ([place] 08/17/2013  . Arthritis 08/05/2013  . 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 Hyperlipidemia LDL goal <100 02/23/2014  . COPD (chronic obstructive pulmonary disease) ([facility]     Past Surgical History  Procedure Laterality Date  . Abdominal hysterectomy      at ge 39. secondary to bleeding  . Toe amputation  Sept 2012    Right 2nd toe, [place]  . Ptca  August 2012    Right Posterior tibial artery , [place]  . Coronary angioplasty with stent placement    . Colonoscopy with propofol N/A 01/09/2015    Procedure: COLONOSCOPY WITH PROPOFOL;  Surgeon: DLucilla Lame MD;  Location: [facility] ENDOSCOPY;  Service: Endoscopy;  Laterality: N/A;  . Cholecystectomy    . Carpal tunnel release Bilateral   . Breast cyst aspiration Right     Social History Social History  Substance Use Topics  . Smoking status: Never Smoker   . Smokeless tobacco: Never Used  . Alcohol Use: No    Family History Family History  Problem Relation Age of Onset  . Heart disease Father   . Heart disease Sister   . Heart disease Brother   . Asthma Grandchild   . Breast cancer Maternal Aunt     Allergies  Allergies   Allergen Reactions  . Contrast Media [Iodinated Diagnostic Agents] Itching  . Gabapentin Itching  . Sulfa Antibiotics Itching     Current Outpatient Prescriptions  Medication Sig Dispense Refill  . albuterol (PROVENTIL) (2.5 MG/3ML) 0.083% nebulizer solution Take 3 mLs (2.5 mg total) by nebulization every 6 (six) hours as needed for wheezing or shortness of breath. 150 mL 1  . alendronate (FOSAMAX) 70 MG tablet Take with a full glass of water on an empty stomach every Sunday  Take with a full glass of water and take no other meds or food for   30 minutes . 12 tablet 1  . ALPRAZolam (XANAX) 0.5 MG tablet Take 1 tablet (0.5 mg total) by mouth daily as needed for sleep or anxiety. 90 tablet 0  . aspirin 81 MG EC tablet Take 1 tablet (81 mg total) by mouth daily. Swallow whole. 30 tablet 12  . Blood Glucose Monitoring Suppl (ACCU-CHEK AVIVA) device Use as instructed 1 each 0  . BRILINTA 90 MG TABS tablet Take 1 tablet (90 mg total) by mouth 2 (two) times daily. 180 tablet 2  . budesonide-formoterol (SYMBICORT) 160-4.5 MCG/ACT inhaler Inhale 2 puffs into the lungs 2 (two) times daily. 1 Inhaler 0  . fluticasone (FLONASE) 50 MCG/ACT nasal spray Place 2 sprays into both nostrils daily. 16 g 2  . furosemide (LASIX) 20 MG tablet Take 1 tablet (20 mg total) by mouth daily. 90 tablet 1  . glucose blood (ACCU-CHEK AVIVA) test strip Use up to three times a day. 100 each 11  . insulin aspart protamine - aspart (NOVOLOG 70/30 MIX) (70-30) 100 UNIT/ML FlexPen 60 units before breakfast,  60 units before dinner.  NEEDS 15 PENS PER MONTH 105 mL 2  . Insulin Pen Needle (B-D UF III MINI PEN NEEDLES) 31G X 5 MM MISC Use  Three times daily 300 each 3  . isosorbide mononitrate (IMDUR) 30 MG 24 hr tablet Take 1 tablet (30 mg total) by mouth daily. 90 tablet 1  . Lancet Devices (ACCU-CHEK SOFTCLIX) lancets Utilize lancets three times a day to check blood glucose levels. 1 each 11  . meloxicam (MOBIC) 15 MG tablet  Take 1 tablet (15 mg total) by mouth daily. 90 tablet 1  . metoprolol tartrate (LOPRESSOR) 25 MG tablet Take 0.5 tablets (12.5 mg total) by mouth 2 (two) times daily. 180 tablet 1  . PARoxetine (PAXIL) 10 MG tablet Take 1 tablet (10 mg total) by mouth daily. 90 tablet 1  . pregabalin (LYRICA) 100 MG capsule Take 1 capsule (100 mg total) by mouth 3 (three) times daily. 270 capsule 3  . rosuvastatin (CRESTOR) 20 MG tablet Take 1 tablet (20 mg total) by mouth daily. 90 tablet 1  . Spacer/Aero-Holding Chambers (AEROCHAMBER MV) inhaler Use as instructed 1 each 0  . benzonatate (TESSALON) 100 MG capsule Take 2 capsules (200 mg total) by mouth 3 (three) times daily  as needed for cough. (Pamela Collier not taking: Reported on 04/12/2015) 90 capsule 0  . Blood Glucose Monitoring Suppl (TRUERESULT BLOOD GLUCOSE) W/DEVICE KIT 1 kit by Does not apply route 3 (three) times daily. (Pamela Collier not taking: Reported on 03/02/2015) 1 each 0  . COMBIVENT RESPIMAT 20-100 MCG/ACT AERS respimat Inhale 1 puff into the lungs 3 (three) times daily as needed. (Pamela Collier not taking: Reported on 03/02/2015) 2 Inhaler 3  . doxycycline (VIBRAMYCIN) 100 MG capsule Take 1 capsule (100 mg total) by mouth 2 (two) times daily. (Pamela Collier not taking: Reported on 03/02/2015) 14 capsule 0  . Lactulose 20 GM/30ML SOLN 30 ml every 4 hours until constipation is relieved (Pamela Collier not taking: Reported on 03/02/2015) 236 mL 3  . losartan (COZAAR) 100 MG tablet Take 1 tablet (100 mg total) by mouth daily. (Pamela Collier not taking: Reported on 04/12/2015) 90 tablet 2  . sodium polystyrene (KAYEXALATE) powder     . traMADol (ULTRAM) 50 MG tablet Take 1 tablet (50 mg total) by mouth every 6 (six) hours as needed for moderate pain. (Pamela Collier not taking: Reported on 03/02/2015) 120 tablet 3  . TRUEPLUS LANCETS 30G MISC USE TO CHECK GLUCOSE THREE TIMES DAILY (Pamela Collier not taking: Reported on 03/02/2015) 400 each 0  . Vitamin D, Ergocalciferol, (DRISDOL) 50000 UNITS CAPS  capsule Take 50,000 Units by mouth every 7 (seven) days.     No current facility-administered medications for this visit.   Facility-Administered Medications Ordered in Other Visits  Medication Dose Route Frequency Provider Last Rate Last Dose  . albuterol (PROVENTIL) (5 MG/ML) 0.5% nebulizer solution 2.5 mg  2.5 mg Nebulization Once [name], MD        ROS:   General:  No weight loss, Fever, chills  HEENT: No recent headaches, no nasal bleeding, no visual changes, no sore throat  Neurologic: No dizziness, blackouts, seizures. No recent symptoms of stroke or mini- stroke. No recent episodes of slurred speech, or temporary blindness.  Cardiac: No recent episodes of chest pain/pressure, no shortness of breath at rest.  + shortness of breath with exertion.  Denies history of atrial fibrillation or irregular heartbeat  Vascular: No history of rest pain in feet.  No history of claudication.  No history of non-healing ulcer, No history of DVT   Pulmonary: No home oxygen, + productive cough, no hemoptysis,  + asthma or wheezing  Musculoskeletal:  _0  Arthritis, _1  Low back pain,  _2  Joint pain  Hematologic:No history of hypercoagulable state.  No history of easy bleeding.  No history of anemia  Gastrointestinal: No hematochezia or melena,  No gastroesophageal reflux, no trouble swallowing  Urinary: _3  chronic Kidney disease, _4  on HD - _5  MWF or _6  TTHS, _7  Burning with urination, _8  Frequent urination, _9  Difficulty urinating;   Skin: No rashes  Psychological: No history of anxiety,  No history of depression   Physical Examination  Filed Vitals:   04/12/15 0914  BP: 147/47  Pulse: 65  Temp: 98 F (36.7 C)  TempSrc: Oral  Height: _10  (1.676 m)  Weight: 194 lb 4.8 oz (88.134 kg)  SpO2: 99%    Body mass index is 31.38 kg/(m^2).  General:  Alert and oriented, no acute distress HEENT: Normal Neck: No bruit or JVD Pulmonary: Clear to auscultation  bilaterally Cardiac: Regular Rate and Rhythm without murmur Abdomen: Soft, non-tender, non-distended, no mass Skin: No rash or ulcer Extremity Pulses:  2+ radial, brachial, femoral,  1-2+ popliteal pulse bilaterally absent dorsalis pedis, posterior tibial pulses bilaterally Musculoskeletal: No deformity or edema  Neurologic: Upper and lower extremity motor 5/5 and symmetric  DATA:   Pamela Collier had bilateral ABIs performed today left side was 1. To 2 with a toe pressure of 97 right side 1.46 with a toe pressure of 88 waveforms were biphasic   ASSESSMENT:   Bilateral peripheral arterial disease  But I would not consider her to be symptomatic. She is much more limited by her neuropathy and her pulmonary dysfunction. She does have adequate perfusion to the feet bilaterally heal any wounds.   PLAN:   Follow-up ABIs in 6 months time. Pamela Collier try to walk more if possible. However, she is severely limited by her pulmonary dysfunction. If she develops nonhealing wounds on her feet or symptoms more consistent with claudication or rest pain would reconsider whether or not an intervention would be necessary. She is not a very good candidate for any intervention due to her overall multiple comorbidities.  Ruta Hinds, MD [facility] [place] Office: [phone] Pager: [phone]

## 2015-04-18 ENCOUNTER — Ambulatory Visit (INDEPENDENT_AMBULATORY_CARE_PROVIDER_SITE_OTHER): Payer: Medicare Other

## 2015-04-18 ENCOUNTER — Other Ambulatory Visit: Payer: Self-pay

## 2015-04-18 ENCOUNTER — Ambulatory Visit
Admission: RE | Admit: 2015-04-18 | Discharge: 2015-04-18 | Disposition: A | Discharge status: Home / Self Care | Payer: Medicare Other | Source: Ambulatory Visit | Attending: Cardiovascular Disease | Admitting: Cardiovascular Disease

## 2015-04-18 ENCOUNTER — Ambulatory Visit (INDEPENDENT_AMBULATORY_CARE_PROVIDER_SITE_OTHER): Payer: Medicare Other | Admitting: Cardiovascular Disease

## 2015-04-18 ENCOUNTER — Encounter: Payer: Self-pay | Admitting: Cardiovascular Disease

## 2015-04-18 VITALS — BP 134/60 | HR 56 | Ht 64.0 in | Wt 193.2 lb

## 2015-04-18 DIAGNOSIS — R079 Chest pain, unspecified: Secondary | ICD-10-CM

## 2015-04-18 DIAGNOSIS — E785 Hyperlipidemia, unspecified: Secondary | ICD-10-CM

## 2015-04-18 DIAGNOSIS — I10 Essential (primary) hypertension: Secondary | ICD-10-CM | POA: Diagnosis not present

## 2015-04-18 DIAGNOSIS — R918 Other nonspecific abnormal finding of lung field: Secondary | ICD-10-CM

## 2015-04-18 DIAGNOSIS — J90 Pleural effusion, not elsewhere classified: Secondary | ICD-10-CM

## 2015-04-18 DIAGNOSIS — Z955 Presence of coronary angioplasty implant and graft: Secondary | ICD-10-CM | POA: Insufficient documentation

## 2015-04-18 DIAGNOSIS — R0602 Shortness of breath: Secondary | ICD-10-CM

## 2015-04-18 DIAGNOSIS — I5032 Chronic diastolic (congestive) heart failure: Secondary | ICD-10-CM | POA: Insufficient documentation

## 2015-04-18 DIAGNOSIS — I209 Angina pectoris, unspecified: Secondary | ICD-10-CM

## 2015-04-18 DIAGNOSIS — I255 Ischemic cardiomyopathy: Secondary | ICD-10-CM

## 2015-04-18 DIAGNOSIS — I2510 Atherosclerotic heart disease of native coronary artery without angina pectoris: Secondary | ICD-10-CM

## 2015-04-18 DIAGNOSIS — Z8709 Personal history of other diseases of the respiratory system: Secondary | ICD-10-CM

## 2015-04-18 DIAGNOSIS — I5033 Acute on chronic diastolic (congestive) heart failure: Secondary | ICD-10-CM

## 2015-04-18 DIAGNOSIS — E1351 Other specified diabetes mellitus with diabetic peripheral angiopathy without gangrene: Secondary | ICD-10-CM

## 2015-04-18 MED ORDER — NITROGLYCERIN 0.4 MG SL SUBL
0.4000 mg | SUBLINGUAL_TABLET | SUBLINGUAL | Status: DC | PRN
Start: 2015-04-18 — End: 2017-01-08

## 2015-04-18 NOTE — Assessment & Plan Note (Signed)
Given her recurrent symptoms, repeat x-ray ordered Suspect she may need to stay on Lasix 20 minute grams twice a day or even change to torsemide with close monitoring of her renal function Effusion possibly from chronic diastolic CHF. Echocardiogram in early 2016 mentioned moderate LVH which could contribute to diastolic dysfunction.

## 2015-04-18 NOTE — Assessment & Plan Note (Signed)
Shortness of breath on today's visit, worse in the past 2-3 weeks per the patient. Exact etiology unclear, may be multifactorial. Underlying sarcoidosis, suspect diastolic CHF, unable to exclude pleural effusion, also unable to exclude upper respiratory infection ( more viral). X-ray and echocardiogram pending. We'll discuss with pulmonary and Dr. Derrel Nip for further suggestions

## 2015-04-18 NOTE — Progress Notes (Signed)
Patient ID: Pamela Collier, female    DOB: 1941-04-11, 74 y.o.   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 She has significant phlegm in the mornings, and after clearing this, does not have additional phlegm throughout the day When she lays down at night, reports some atypical type left chest pain but denies having any significant discomfort on exertion Left-sided chest pain feels different from her previous anginal symptoms Possibly some postnasal drip. Feels like she is fighting something. She reports significant abdominal bloating, similar to when she had her pleural effusion. Denies any leg edema For the past week she has been taking Lasix twice a day with no significant improvement of her symptoms  Review of previous records with her including echocardiogram May 2016 showed normal ejection fraction, This was done in [place], no mention of right heart pressures. Prior echocardiogram at the end of 2015 done at [facility] documented ejection fraction 45%  CT scan images reviewed with her in detail. CT from 01/11/2015 showed mild to moderate sized pleural effusion on the right extending from base to apex in a supine position. Repeat  chest x-ray at the end of September showed no significant effusion  Symptoms seem to present after she had colonoscopy, possibly had IV fluids during the procedure, symptoms improved on higher dose Lasix  She does report having pleural effusions in the past requiring thoracentesis, this was also documented in note by pulmonary No malignancy noted on previous evaluation  EKG on today's visit shows normal sinus rhythm with rate 56 bpm, left anterior fascicular block, poor R-wave progression through the anterior precordial leads, ST and T wave abnormality in 1 and aVL, intraventricular conduction delay  Other past medical history Cardiac catheterization from January 2013 documenting ejection fraction 35-40%, 40% mid LAD, 50% distal LAD, small diagonal with 70% disease, 50% proximal left circumflex, 75% distal left circumflex with patent stent, small RCA, calcified, severe atherosclerosis, 90% distal RCA  Details from catheterization March 2016 are not available, request made to hospital in [place] on today's visit for records    Allergies  Allergen Reactions  . Contrast Media [Iodinated Diagnostic Agents] Itching  . Gabapentin Itching  . Sulfa Antibiotics Itching    Current Outpatient Prescriptions on File Prior to Visit  Medication Sig Dispense Refill  . albuterol (PROVENTIL) (2.5 MG/3ML) 0.083% nebulizer solution Take 3 mLs (2.5 mg total) by nebulization every 6 (six) hours as needed for wheezing or shortness of breath. 150 mL 1  . alendronate (FOSAMAX) 70 MG tablet Take with a full glass of water on an empty stomach every Sunday  Take with a full glass of water and take no other meds or food for   30 minutes . 12 tablet 1  . ALPRAZolam (XANAX) 0.5 MG tablet Take 1 tablet (  0.5 mg total) by mouth daily as needed for sleep or anxiety. 90 tablet 0  . aspirin 81 MG EC tablet Take 1 tablet (81 mg total) by mouth daily. Swallow whole. 30 tablet 12  . benzonatate (TESSALON) 100 MG capsule Take  2 capsules (200 mg total) by mouth 3 (three) times daily as needed for cough. 90 capsule 0  . Blood Glucose Monitoring Suppl (ACCU-CHEK AVIVA) device Use as instructed 1 each 0  . Blood Glucose Monitoring Suppl (TRUERESULT BLOOD GLUCOSE) W/DEVICE KIT 1 kit by Does not apply route 3 (three) times daily. 1 each 0  . BRILINTA 90 MG TABS tablet Take 1 tablet (90 mg total) by mouth 2 (two) times daily. 180 tablet 2  . budesonide-formoterol (SYMBICORT) 160-4.5 MCG/ACT inhaler Inhale 2 puffs into the lungs 2 (two) times daily. 1 Inhaler 0  . COMBIVENT RESPIMAT 20-100 MCG/ACT AERS respimat Inhale 1 puff into the lungs 3 (three) times daily as needed. 2 Inhaler 3  . fluticasone (FLONASE) 50 MCG/ACT nasal spray Place 2 sprays into both nostrils daily. 16 g 2  . furosemide (LASIX) 20 MG tablet Take 1 tablet (20 mg total) by mouth daily. 90 tablet 1  . glucose blood (ACCU-CHEK AVIVA) test strip Use up to three times a day. 100 each 11  . insulin aspart protamine - aspart (NOVOLOG 70/30 MIX) (70-30) 100 UNIT/ML FlexPen 60 units before breakfast,  60 units before dinner.  NEEDS 15 PENS PER MONTH (Patient taking differently: 44 units before breakfast,  50 units before dinner.  NEEDS 15 PENS PER MONTH) 105 mL 2  . Insulin Pen Needle (B-D UF III MINI PEN NEEDLES) 31G X 5 MM MISC Use  Three times daily 300 each 3  . isosorbide mononitrate (IMDUR) 30 MG 24 hr tablet Take 1 tablet (30 mg total) by mouth daily. 90 tablet 1  . Lactulose 20 GM/30ML SOLN 30 ml every 4 hours until constipation is relieved 236 mL 3  . Lancet Devices (ACCU-CHEK SOFTCLIX) lancets Utilize lancets three times a day to check blood glucose levels. 1 each 11  . losartan (COZAAR) 100 MG tablet Take 1 tablet (100 mg total) by mouth daily. 90 tablet 2  . meloxicam (MOBIC) 15 MG tablet Take 1 tablet (15 mg total) by mouth daily. 90 tablet 1  . metoprolol tartrate (LOPRESSOR) 25 MG tablet Take 0.5 tablets (12.5 mg total) by mouth 2 (two) times daily. 180  tablet 1  . PARoxetine (PAXIL) 10 MG tablet Take 1 tablet (10 mg total) by mouth daily. 90 tablet 1  . pregabalin (LYRICA) 100 MG capsule Take 1 capsule (100 mg total) by mouth 3 (three) times daily. 270 capsule 3  . rosuvastatin (CRESTOR) 20 MG tablet Take 1 tablet (20 mg total) by mouth daily. 90 tablet 1  . Spacer/Aero-Holding Chambers (AEROCHAMBER MV) inhaler Use as instructed 1 each 0  . traMADol (ULTRAM) 50 MG tablet Take 1 tablet (50 mg total) by mouth every 6 (six) hours as needed for moderate pain. 120 tablet 3  . TRUEPLUS LANCETS 30G MISC USE TO CHECK GLUCOSE THREE TIMES DAILY 400 each 0  . Vitamin D, Ergocalciferol, (DRISDOL) 50000 UNITS CAPS capsule Take 50,000 Units by mouth every 7 (seven) days.     Current Facility-Administered Medications on File Prior to Visit  Medication Dose Route Frequency Provider Last Rate Last Dose  . albuterol (PROVENTIL) (5 MG/ML) 0.5% nebulizer solution 2.5 mg  2.5 mg Nebulization Once [name], MD  Past Medical History  Diagnosis Date  . Diabetes mellitus   . 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 Cardiomyopathy, ischemic Jan 2013    EF 35 to 40% [facility])  . Hepatic steatosis     by CT abd pelvis  . Osteoporosis, post-menopausal   . 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 Atherosclerotic peripheral vascular disease with gangrene [facility]) august 2012  . Hypertension   . Hyperlipidemia   . Sarcoidosis ([place])   . CHF (congestive heart failure) ([place])   . Renal insufficiency   . 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 CCF (congestive cardiac failure) ([place]) 08/17/2013  . Pleural effusion 10/25/2012    10/2012 CT chest >> small to moderate R lung effusion>> chylothorax, 100% lymphs 10/2013 thoracentesis> cytology negative, WBC 1471, > 90% "small lymphs" 01/2014 CT chest> near complete resolution of  pleural effusion, stable lymphadenopathy 08/2014 CT chest [place] ([facility]): small right sided effusion decreased in size, stable mediastinal lymphadenopathy 1.0cm largest, 28m . Pulmonary sarcoidosis ([place] 12/07/2012    [place] 03/2013 Full PFT [facility] > UNACCEPTABLE AND NOT REPRODUCIBLE DATA> Ratio 71% FEV 1 1.02 L (55% pred), FVC 1.31 L (49% pred) could not do lung volumes or DLCO   . Acute respiratory failure ([place] 11/21/2014  . Diabetic diarrhea ([place] 10/03/2014  . DM type 2, uncontrolled, with renal complications ([place] 85/12/5275 . Cerebral infarct ([place] 08/17/2013  . Arthritis 08/05/2013  . 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 Hyperlipidemia LDL goal <100 02/23/2014  . COPD (chronic obstructive pulmonary disease) ([facility]     Past Surgical History  Procedure Laterality Date  . Abdominal hysterectomy      at ge 39. secondary to bleeding  . Toe amputation  Sept 2012    Right 2nd toe, [place]  . Ptca  August 2012    Right Posterior tibial artery , [place]  . Colonoscopy with propofol N/A 01/09/2015    Procedure: COLONOSCOPY WITH PROPOFOL;  Surgeon: DLucilla Lame MD;  Location: [facility] ENDOSCOPY;  Service: Endoscopy;  Laterality: N/A;  . Cholecystectomy    . Carpal tunnel release Bilateral   . Breast cyst aspiration Right   . Coronary angioplasty with stent placement      [place] [facility]    Social History  reports that she has never smoked. She has never used smokeless tobacco. She reports that she does not drink alcohol or use illicit drugs.  Family History family history includes Asthma in her grandchild; Breast cancer in her maternal aunt; Heart attack in her brother, father, and sister; Heart disease in her brother, father, and sister.   Review of Systems  Constitutional: Negative.   Respiratory: Positive for shortness of breath.   Cardiovascular: Positive for chest pain.   Gastrointestinal: Negative.        Bloating  Musculoskeletal: Negative.   Neurological: Negative.   Hematological: Negative.   Psychiatric/Behavioral: Negative.   All other systems reviewed and are negative.   BP 134/60 mmHg  Pulse 56  Ht _0  (1.626 m)  Wt 193 lb 4 oz (87.658 kg)  BMI 33.16 kg/m2  Physical Exam  Constitutional: She is oriented to person, place, and time. She appears well-developed and well-nourished.  obese  HENT:  Head: Normocephalic.  Nose: Nose normal.  Mouth/Throat: Oropharynx is clear and moist.  Eyes: Conjunctivae are normal. Pupils are equal, round, and reactive to light.  Neck: Normal range of motion. Neck supple. No JVD present.  Cardiovascular: Normal rate, regular rhythm, normal heart sounds and intact distal pulses.  Exam reveals no gallop and no friction rub.   No murmur heard. Pulmonary/Chest: Effort normal. No respiratory distress. She has decreased breath sounds in the right lower field. She has no wheezes. She has no rales. She exhibits no tenderness.  Abdominal: Soft. Bowel sounds are normal. She exhibits no distension. There is no tenderness.  Musculoskeletal: Normal range of motion. She exhibits no edema or tenderness.  Lymphadenopathy:    She has no cervical adenopathy.  Neurological: She is alert and oriented to person, place, and time. Coordination normal.  Skin: Skin is warm and dry. No rash noted. No erythema.  Psychiatric: She has a normal mood and affect. Her behavior is normal. Judgment and thought content normal.

## 2015-04-18 NOTE — Assessment & Plan Note (Signed)
She does report some chest pain on the left, atypical from her usual anginal symptoms. She does have three-vessel disease, catheterization earlier this year. Atypical symptoms presenting at rest at nighttime when laying down. We'll hold off on any stress testing at this time.

## 2015-04-18 NOTE — Assessment & Plan Note (Signed)
Ejection fraction ranging over the past several years from low of 35% on catheterization up to normal on echocardiogram in early 2016. Repeat echocardiogram ordered for EF and to estimate right heart pressures

## 2015-04-18 NOTE — Assessment & Plan Note (Signed)
CT scan suggesting she has stent in RCA, LAD, circumflex. Latest catheterization from early 2016 in [place] has been requested

## 2015-04-18 NOTE — Patient Instructions (Addendum)
  No medication changes were made.  For your shortness of breath, pleural effusion history, chest pain We will order CXR and echocardiogram (hx of CAD, stents)  Please take order to [address] entrance of [facility] for chest x-ray  Your physician has requested that you have an echocardiogram. Echocardiography is a painless test that uses sound waves to create images of your heart. It provides your doctor with information about the size and shape of your heart and how well your heart's chambers and valves are working. This procedure takes approximately one hour. There are no restrictions for this procedure.  Date & time: _________________________________  Please call us if you have new issues that need to be addressed before your next appt.  Your physician wants you to follow-up in: 1 month.   Echocardiogram An echocardiogram, or echocardiography, uses sound waves (ultrasound) to produce an image of your heart. The echocardiogram is simple, painless, obtained within a short period of time, and offers valuable information to your health care provider. The images from an echocardiogram can provide information such as:  Evidence of coronary artery disease (CAD).  Heart size.  Heart muscle function.  Heart valve function.  Aneurysm detection.  Evidence of a past heart attack.  Fluid buildup around the heart.  Heart muscle thickening.  Assess heart valve function. LET [facility] CARE PROVIDER KNOW ABOUT:  Any allergies you have.  All medicines you are taking, including vitamins, herbs, eye drops, creams, and over-the-counter medicines.  Previous problems you or members of your family have had with the use of anesthetics.  Any blood disorders you have.  Previous surgeries you have had.  Medical conditions you have.  Possibility of pregnancy, if this applies. BEFORE THE PROCEDURE  No special preparation is needed. Eat and drink normally.  PROCEDURE   In order to produce an  image of your heart, gel will be applied to your chest and a wand-like tool (transducer) will be moved over your chest. The gel will help transmit the sound waves from the transducer. The sound waves will harmlessly bounce off your heart to allow the heart images to be captured in real-time motion. These images will then be recorded.  You may need an IV to receive a medicine that improves the quality of the pictures. AFTER THE PROCEDURE You may return to your normal schedule including diet, activities, and medicines, unless your health care provider tells you otherwise.   This information is not intended to replace advice given to you by your health care provider. Make sure you discuss any questions you have with your health care provider.   Document Released: 04/25/2000 Document Revised: 05/19/2014 Document Reviewed: 01/03/2013 Elsevier Interactive Patient Education [employer].

## 2015-04-18 NOTE — Assessment & Plan Note (Signed)
Recent effusion on the right 01/11/2015 on CT scan, resolved and follow-up echo several weeks later on higher dose Lasix. Now with recurrent symptoms of abdominal bloating, shortness of breath, cough with sputum. She has been on Lasix twice a day for the past week with no improvement of her symptoms. Chest x-ray ordered to rule out effusion. Also some confusion concerning her ejection fraction, was read as normal in [place] March 2016, 45% on echo in 2015, was 35-40% by catheterization January 2013. Echo ordered to estimate ejection fraction, right heart pressures. If needed, right heart catheterization could be performed

## 2015-04-18 NOTE — Assessment & Plan Note (Signed)
Total cholesterol at goal, LDL slightly above goal. Recommended a low carbohydrate diet in effort to improve diabetes numbers

## 2015-04-18 NOTE — Assessment & Plan Note (Signed)
Followed by Dr. Oneida Alar in [place], she denies any claudication type symptoms We have stressed importance of aggressive diabetes control, cholesterol

## 2015-04-19 ENCOUNTER — Emergency Department: Payer: Medicare Other

## 2015-04-19 ENCOUNTER — Inpatient Hospital Stay
Admission: EM | Admit: 2015-04-19 | Discharge: 2015-04-22 | DRG: 683 | Disposition: A | Discharge status: Skilled Nursing Facility | Payer: Medicare Other | Attending: Internal Medicine | Admitting: Internal Medicine

## 2015-04-19 ENCOUNTER — Encounter: Payer: Self-pay | Admitting: Emergency Medicine

## 2015-04-19 DIAGNOSIS — I2510 Atherosclerotic heart disease of native coronary artery without angina pectoris: Secondary | ICD-10-CM | POA: Diagnosis present

## 2015-04-19 DIAGNOSIS — N179 Acute kidney failure, unspecified: Secondary | ICD-10-CM | POA: Insufficient documentation

## 2015-04-19 DIAGNOSIS — I5042 Chronic combined systolic (congestive) and diastolic (congestive) heart failure: Secondary | ICD-10-CM | POA: Diagnosis present

## 2015-04-19 DIAGNOSIS — R531 Weakness: Secondary | ICD-10-CM

## 2015-04-19 DIAGNOSIS — J449 Chronic obstructive pulmonary disease, unspecified: Secondary | ICD-10-CM | POA: Diagnosis present

## 2015-04-19 DIAGNOSIS — Z89421 Acquired absence of other right toe(s): Secondary | ICD-10-CM

## 2015-04-19 DIAGNOSIS — Z8673 Personal history of transient ischemic attack (TIA), and cerebral infarction without residual deficits: Secondary | ICD-10-CM | POA: Diagnosis not present

## 2015-04-19 DIAGNOSIS — F418 Other specified anxiety disorders: Secondary | ICD-10-CM | POA: Diagnosis present

## 2015-04-19 DIAGNOSIS — N189 Chronic kidney disease, unspecified: Secondary | ICD-10-CM

## 2015-04-19 DIAGNOSIS — E785 Hyperlipidemia, unspecified: Secondary | ICD-10-CM | POA: Diagnosis present

## 2015-04-19 DIAGNOSIS — M910 Juvenile osteochondrosis of pelvis: Secondary | ICD-10-CM | POA: Diagnosis not present

## 2015-04-19 DIAGNOSIS — I5032 Chronic diastolic (congestive) heart failure: Secondary | ICD-10-CM | POA: Diagnosis present

## 2015-04-19 DIAGNOSIS — R278 Other lack of coordination: Secondary | ICD-10-CM | POA: Diagnosis not present

## 2015-04-19 DIAGNOSIS — Z79899 Other long term (current) drug therapy: Secondary | ICD-10-CM

## 2015-04-19 DIAGNOSIS — Z1612 Extended spectrum beta lactamase (ESBL) resistance: Secondary | ICD-10-CM | POA: Diagnosis not present

## 2015-04-19 DIAGNOSIS — E1151 Type 2 diabetes mellitus with diabetic peripheral angiopathy without gangrene: Secondary | ICD-10-CM | POA: Diagnosis present

## 2015-04-19 DIAGNOSIS — R296 Repeated falls: Secondary | ICD-10-CM | POA: Diagnosis not present

## 2015-04-19 DIAGNOSIS — I129 Hypertensive chronic kidney disease with stage 1 through stage 4 chronic kidney disease, or unspecified chronic kidney disease: Secondary | ICD-10-CM | POA: Diagnosis not present

## 2015-04-19 DIAGNOSIS — N390 Urinary tract infection, site not specified: Secondary | ICD-10-CM | POA: Diagnosis present

## 2015-04-19 DIAGNOSIS — M1990 Unspecified osteoarthritis, unspecified site: Secondary | ICD-10-CM | POA: Diagnosis not present

## 2015-04-19 DIAGNOSIS — W19XXXA Unspecified fall, initial encounter: Secondary | ICD-10-CM | POA: Diagnosis present

## 2015-04-19 DIAGNOSIS — M25551 Pain in right hip: Secondary | ICD-10-CM | POA: Diagnosis not present

## 2015-04-19 DIAGNOSIS — R42 Dizziness and giddiness: Secondary | ICD-10-CM | POA: Diagnosis not present

## 2015-04-19 DIAGNOSIS — M810 Age-related osteoporosis without current pathological fracture: Secondary | ICD-10-CM | POA: Diagnosis present

## 2015-04-19 DIAGNOSIS — E782 Mixed hyperlipidemia: Secondary | ICD-10-CM | POA: Diagnosis present

## 2015-04-19 DIAGNOSIS — E860 Dehydration: Secondary | ICD-10-CM | POA: Diagnosis present

## 2015-04-19 DIAGNOSIS — I10 Essential (primary) hypertension: Secondary | ICD-10-CM | POA: Diagnosis not present

## 2015-04-19 DIAGNOSIS — E1165 Type 2 diabetes mellitus with hyperglycemia: Secondary | ICD-10-CM | POA: Diagnosis present

## 2015-04-19 DIAGNOSIS — M545 Low back pain: Secondary | ICD-10-CM | POA: Diagnosis not present

## 2015-04-19 DIAGNOSIS — I130 Hypertensive heart and chronic kidney disease with heart failure and stage 1 through stage 4 chronic kidney disease, or unspecified chronic kidney disease: Secondary | ICD-10-CM | POA: Diagnosis present

## 2015-04-19 DIAGNOSIS — Z9181 History of falling: Secondary | ICD-10-CM | POA: Diagnosis not present

## 2015-04-19 DIAGNOSIS — R52 Pain, unspecified: Secondary | ICD-10-CM

## 2015-04-19 DIAGNOSIS — E119 Type 2 diabetes mellitus without complications: Secondary | ICD-10-CM | POA: Diagnosis not present

## 2015-04-19 DIAGNOSIS — I639 Cerebral infarction, unspecified: Secondary | ICD-10-CM

## 2015-04-19 DIAGNOSIS — Z794 Long term (current) use of insulin: Secondary | ICD-10-CM

## 2015-04-19 DIAGNOSIS — I255 Ischemic cardiomyopathy: Secondary | ICD-10-CM | POA: Diagnosis present

## 2015-04-19 DIAGNOSIS — E1122 Type 2 diabetes mellitus with diabetic chronic kidney disease: Secondary | ICD-10-CM | POA: Diagnosis not present

## 2015-04-19 DIAGNOSIS — M25562 Pain in left knee: Secondary | ICD-10-CM | POA: Diagnosis not present

## 2015-04-19 DIAGNOSIS — R202 Paresthesia of skin: Secondary | ICD-10-CM | POA: Diagnosis not present

## 2015-04-19 DIAGNOSIS — M25559 Pain in unspecified hip: Secondary | ICD-10-CM | POA: Diagnosis not present

## 2015-04-19 DIAGNOSIS — N183 Chronic kidney disease, stage 3 (moderate): Secondary | ICD-10-CM | POA: Diagnosis present

## 2015-04-19 DIAGNOSIS — M6281 Muscle weakness (generalized): Secondary | ICD-10-CM | POA: Diagnosis not present

## 2015-04-19 DIAGNOSIS — R262 Difficulty in walking, not elsewhere classified: Secondary | ICD-10-CM | POA: Diagnosis not present

## 2015-04-19 LAB — CBC WITH DIFFERENTIAL/PLATELET
BASOS ABS: 0 10*3/uL (ref 0–0.1)
BASOS PCT: 1 %
EOS ABS: 0.1 10*3/uL (ref 0–0.7)
EOS PCT: 2 %
HCT: 34.8 % — ABNORMAL LOW (ref 35.0–47.0)
HEMOGLOBIN: 11.9 g/dL — AB (ref 12.0–16.0)
LYMPHS ABS: 1.2 10*3/uL (ref 1.0–3.6)
Lymphocytes Relative: 15 %
MCH: 30.2 pg (ref 26.0–34.0)
MCHC: 34.1 g/dL (ref 32.0–36.0)
MCV: 88.5 fL (ref 80.0–100.0)
Monocytes Absolute: 0.8 10*3/uL (ref 0.2–0.9)
Monocytes Relative: 11 %
NEUTROS PCT: 71 %
Neutro Abs: 5.8 10*3/uL (ref 1.4–6.5)
PLATELETS: 312 10*3/uL (ref 150–440)
RBC: 3.93 MIL/uL (ref 3.80–5.20)
RDW: 15.2 % — ABNORMAL HIGH (ref 11.5–14.5)
WBC: 8 10*3/uL (ref 3.6–11.0)

## 2015-04-19 LAB — URINALYSIS COMPLETE WITH MICROSCOPIC (ARMC ONLY)
Bilirubin Urine: NEGATIVE
Glucose, UA: NEGATIVE mg/dL
HGB URINE DIPSTICK: NEGATIVE
NITRITE: NEGATIVE
Protein, ur: 100 mg/dL — AB
SPECIFIC GRAVITY, URINE: 1.012 (ref 1.005–1.030)
pH: 5 (ref 5.0–8.0)

## 2015-04-19 LAB — MAGNESIUM: MAGNESIUM: 2.3 mg/dL (ref 1.7–2.4)

## 2015-04-19 LAB — BASIC METABOLIC PANEL
Anion gap: 6 (ref 5–15)
BUN: 40 mg/dL — AB (ref 6–20)
CALCIUM: 9 mg/dL (ref 8.9–10.3)
CHLORIDE: 106 mmol/L (ref 101–111)
CO2: 27 mmol/L (ref 22–32)
CREATININE: 2.3 mg/dL — AB (ref 0.44–1.00)
GFR calc Af Amer: 23 mL/min — ABNORMAL LOW (ref >60)
GFR calc non Af Amer: 20 mL/min — ABNORMAL LOW (ref >60)
Glucose, Bld: 264 mg/dL — ABNORMAL HIGH (ref 65–99)
Potassium: 4.7 mmol/L (ref 3.5–5.1)
SODIUM: 139 mmol/L (ref 135–145)

## 2015-04-19 LAB — COMPREHENSIVE METABOLIC PANEL
ALK PHOS: 59 U/L (ref 38–126)
ALT: 18 U/L (ref 14–54)
ANION GAP: 9 (ref 5–15)
AST: 20 U/L (ref 15–41)
Albumin: 3.7 g/dL (ref 3.5–5.0)
BILIRUBIN TOTAL: 0.5 mg/dL (ref 0.3–1.2)
BUN: 37 mg/dL — ABNORMAL HIGH (ref 6–20)
CALCIUM: 9.8 mg/dL (ref 8.9–10.3)
CO2: 28 mmol/L (ref 22–32)
CREATININE: 2.04 mg/dL — AB (ref 0.44–1.00)
Chloride: 100 mmol/L — ABNORMAL LOW (ref 101–111)
GFR, EST AFRICAN AMERICAN: 26 mL/min — AB (ref >60)
GFR, EST NON AFRICAN AMERICAN: 23 mL/min — AB (ref >60)
Glucose, Bld: 196 mg/dL — ABNORMAL HIGH (ref 65–99)
Potassium: 4.3 mmol/L (ref 3.5–5.1)
SODIUM: 137 mmol/L (ref 135–145)
TOTAL PROTEIN: 7.6 g/dL (ref 6.5–8.1)

## 2015-04-19 LAB — TROPONIN I
TROPONIN I: 0.03 ng/mL (ref <0.031)
Troponin I: <0.03 ng/mL (ref <0.031)

## 2015-04-19 LAB — LIPASE, BLOOD: LIPASE: 43 U/L (ref 11–51)

## 2015-04-19 MED ORDER — SODIUM CHLORIDE 0.9 % IV BOLUS (SEPSIS)
500.0000 mL | INTRAVENOUS | Status: AC
  Administered 2015-04-19: 500 mL via INTRAVENOUS

## 2015-04-19 MED ORDER — DEXTROSE 5 % IV SOLN
1.0000 g | INTRAVENOUS | Status: AC
  Administered 2015-04-19: 1 g via INTRAVENOUS
  Filled 2015-04-19: qty 10

## 2015-04-19 NOTE — ED Notes (Signed)
In and out cath by [facility] NA

## 2015-04-19 NOTE — ED Notes (Signed)
Returned from xray

## 2015-04-19 NOTE — ED Notes (Signed)
Patient transported to X-ray 

## 2015-04-19 NOTE — ED Notes (Signed)
hospitalist at bedside to eval for admission

## 2015-04-19 NOTE — H&P (Signed)
[facility] [name]    MR#:  [id number]  DATE OF BIRTH:  [birth year]  DATE OF ADMISSION:  04/19/2015  PRIMARY CARE PHYSICIAN: [name], MD   REQUESTING/REFERRING PHYSICIAN: Karma Greaser, M.D.  CHIEF COMPLAINT:   Chief Complaint  Patient presents with  . Fall    HISTORY OF PRESENT ILLNESS:  Pamela Collier  is a 74 y.o. female who presents with fall. Patient states that she has congestive heart failure, and recently had her diuretic dose increased. She states that she is feeling somewhat weak, increasingly so since that time. Today she stood up to walk from her kitchen into her bedroom, and had an episode of lightheadedness and a fall. She denies any loss of consciousness or significant trauma. She came to the ED for evaluation. Here she was found to have a urine mildly suspicious for possible UTI, the patient denies any urinary symptoms. She is also found to have acute on chronic kidney failure. Hospitalists were called for acute on chronic kidney failure and dehydration and potential mild UTI.  PAST MEDICAL HISTORY:   Past Medical History  Diagnosis Date  . Diabetes mellitus   . 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 Cardiomyopathy, ischemic Jan 2013    EF 35 to 40% [facility])  . Hepatic steatosis     by CT abd pelvis  . Osteoporosis, post-menopausal   . 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 Atherosclerotic peripheral vascular disease with gangrene [facility]) august 2012  . Hypertension   . Hyperlipidemia   . Sarcoidosis ([place])   . CHF (congestive heart failure) ([place])   . Renal insufficiency   . 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 CCF (congestive cardiac failure) ([place]) 08/17/2013  . Pleural effusion 10/25/2012    10/2012 CT chest >> small to moderate R lung  effusion>> chylothorax, 100% lymphs 10/2013 thoracentesis> cytology negative, WBC 1471, > 90% "small lymphs" 01/2014 CT chest> near complete resolution of pleural effusion, stable lymphadenopathy 08/2014 CT chest [place] ([facility]): small right sided effusion decreased in size, stable mediastinal lymphadenopathy 1.0cm largest, 5m . 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 Acute respiratory failure ([place] 11/21/2014  . Diabetic diarrhea ([place] 10/03/2014  . DM type 2, uncontrolled, with renal complications ([place] 82/06/252 . Cerebral infarct [place] 08/17/2013  . Arthritis 08/05/2013  . 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 Hyperlipidemia LDL goal <100 02/23/2014  . COPD (chronic obstructive pulmonary disease) ([place]     PAST SURGICAL HISTORY:   Past Surgical History  Procedure Laterality Date  . Abdominal hysterectomy      at ge 39. secondary to bleeding  . Toe amputation  Sept 2012    Right 2nd toe, [place]  . Ptca  August 2012    Right Posterior tibial artery , [place]  . Colonoscopy with propofol N/A 01/09/2015    Procedure: COLONOSCOPY WITH PROPOFOL;  Surgeon: DLucilla Lame MD;  Location: [facility] ENDOSCOPY;  Service: Endoscopy;  Laterality: N/A;  . Cholecystectomy    .  Carpal tunnel release Bilateral   . Breast cyst aspiration Right   . Coronary angioplasty with stent placement      [place]; [facility]    SOCIAL HISTORY:   Social History  Substance Use Topics  . Smoking status: Never Smoker   . Smokeless tobacco: Never Used  . Alcohol Use: No    FAMILY HISTORY:   Family History  Problem Relation Age of Onset  . Heart disease Father   . Heart attack Father   . Heart disease Sister   . Heart attack Sister   .  Heart disease Brother   . Heart attack Brother   . Asthma Grandchild   . Breast cancer Maternal Aunt     DRUG ALLERGIES:   Allergies  Allergen Reactions  . Contrast Media [Iodinated Diagnostic Agents] Itching  . Gabapentin Itching  . Sulfa Antibiotics Itching    MEDICATIONS AT HOME:   Prior to Admission medications   Medication Sig Start Date End Date Taking? Authorizing Provider  albuterol (PROVENTIL) (2.5 MG/3ML) 0.083% nebulizer solution Take 3 mLs (2.5 mg total) by nebulization every 6 (six) hours as needed for wheezing or shortness of breath. 12/06/14  Yes [name], MD  alendronate (FOSAMAX) 70 MG tablet Take with a full glass of water on an empty stomach every Sunday  Take with a full glass of water and take no other meds or food for   30 minutes . 12/01/14  Yes [name], MD  ALPRAZolam [name]) 0.5 MG tablet Take 1 tablet (0.5 mg total) by mouth daily as needed for sleep or anxiety. 12/01/14  Yes [name], MD  aspirin 81 MG EC tablet Take 1 tablet (81 mg total) by mouth daily. Swallow whole. 04/18/12  Yes [name], MD  benzonatate (TESSALON) 100 MG capsule Take 2 capsules (200 mg total) by mouth 3 (three) times daily as needed for cough. 02/13/15  Yes [name], MD  Blood Glucose Monitoring Suppl (ACCU-CHEK AVIVA) device Use as instructed 02/26/15 02/26/16 Yes [name], MD  Blood Glucose Monitoring Suppl (TRUERESULT BLOOD GLUCOSE) W/DEVICE KIT 1 kit by Does not apply route 3 (three) times daily. 09/27/13  Yes [name], MD  BRILINTA 90 MG TABS tablet Take 1 tablet (90 mg total) by mouth 2 (two) times daily. 12/06/14  Yes [name], MD  budesonide-formoterol (SYMBICORT) 160-4.5 MCG/ACT inhaler Inhale 2 puffs into the lungs 2 (two) times daily. 02/23/15  Yes Juanito Doom, MD  COMBIVENT RESPIMAT 20-100 MCG/ACT AERS respimat Inhale 1 puff into the lungs 3 (three) times daily as needed. Patient taking differently: Inhale 1 puff into the lungs 3  (three) times daily as needed for wheezing or shortness of breath.  12/06/14  Yes [name], MD  fluticasone (FLONASE) 50 MCG/ACT nasal spray Place 2 sprays into both nostrils daily. 12/06/14  Yes [name], MD  furosemide (LASIX) 20 MG tablet Take 1 tablet (20 mg total) by mouth daily. 12/01/14  Yes [name], MD  glucose blood (ACCU-CHEK AVIVA) test strip Use up to three times a day. 03/02/15  Yes [name], MD  insulin aspart protamine - aspart (NOVOLOG 70/30 MIX) (70-30) 100 UNIT/ML FlexPen 60 units before breakfast,  60 units before dinner.  NEEDS 15 PENS PER MONTH Patient taking differently: Inject 44-50 Units into the skin See admin instructions. 44 units before breakfast and 50 units before supper 12/06/14  Yes Helene Kelp  Ether Griffins, MD  Insulin Pen Needle (B-D UF III MINI PEN NEEDLES) 31G X 5 MM MISC Use  Three times daily 12/01/14  Yes [name], MD  isosorbide mononitrate (IMDUR) 30 MG 24 hr tablet Take 1 tablet (30 mg total) by mouth daily. 12/01/14  Yes [name], MD  Lactulose 20 GM/30ML SOLN 30 ml every 4 hours until constipation is relieved Patient taking differently: Take 30 mLs by mouth every 4 (four) hours as needed (for constipation).  04/03/14  Yes [name], MD  Lancet Devices [facility]) lancets Utilize lancets three times a day to check blood glucose levels. 03/06/15  Yes [name], MD  losartan (COZAAR) 100 MG tablet Take 1 tablet (100 mg total) by mouth daily. 12/01/14  Yes [name], MD  meloxicam (MOBIC) 15 MG tablet Take 1 tablet (15 mg total) by mouth daily. 12/01/14  Yes [name], MD  metoprolol tartrate (LOPRESSOR) 25 MG tablet Take 0.5 tablets (12.5 mg total) by mouth 2 (two) times daily. 12/06/14  Yes [name], MD  nitroGLYCERIN (NITROSTAT) 0.4 MG SL tablet Place 1 tablet (0.4 mg total) under the tongue every 5 (five) minutes as needed for chest pain. 04/18/15  Yes Minna Merritts, MD  PARoxetine (PAXIL) 10 MG tablet  Take 1 tablet (10 mg total) by mouth daily. 12/06/14  Yes [name], MD  pregabalin (LYRICA) 100 MG capsule Take 1 capsule (100 mg total) by mouth 3 (three) times daily. 12/06/14  Yes [name], MD  rosuvastatin (CRESTOR) 20 MG tablet Take 1 tablet (20 mg total) by mouth daily. Patient taking differently: Take 20 mg by mouth at bedtime.  12/01/14  Yes [name], MD  Spacer/Aero-Holding Chambers (AEROCHAMBER MV) inhaler Use as instructed 02/23/15  Yes Juanito Doom, MD  traMADol (ULTRAM) 50 MG tablet Take 1 tablet (50 mg total) by mouth every 6 (six) hours as needed for moderate pain. 12/01/14  Yes [name], MD  TRUEPLUS LANCETS 30G MISC USE TO CHECK GLUCOSE THREE TIMES DAILY 07/21/14  Yes [name], MD  Vitamin D, Ergocalciferol, (DRISDOL) 50000 UNITS CAPS capsule Take 50,000 Units by mouth every 7 (seven) days.   Yes Historical Provider, MD    REVIEW OF SYSTEMS:  Review of Systems  Constitutional: Positive for malaise/fatigue. Negative for fever, chills and weight loss.  HENT: Negative for ear pain, hearing loss and tinnitus.   Eyes: Negative for blurred vision, double vision, pain and redness.  Respiratory: Negative for cough, hemoptysis and shortness of breath.   Cardiovascular: Negative for chest pain, palpitations, orthopnea and leg swelling.  Gastrointestinal: Negative for nausea, vomiting, abdominal pain, diarrhea and constipation.  Genitourinary: Negative for dysuria, frequency and hematuria.  Musculoskeletal: Positive for falls. Negative for back pain, joint pain and neck pain.  Skin:       No acne, rash, or lesions  Neurological: Positive for weakness. Negative for dizziness, tremors and focal weakness.  Endo/Heme/Allergies: Negative for polydipsia. Does not bruise/bleed easily.  Psychiatric/Behavioral: Negative for depression. The patient is not nervous/anxious and does not have insomnia.      VITAL SIGNS:   Filed Vitals:   04/19/15 1800 04/19/15 1946  04/19/15 2202 04/19/15 2231  BP: 134/50 141/61 139/46 152/59  Pulse: 61 64 62 61  Temp:      TempSrc:      Resp: 19 18 20    Height:      Weight:  SpO2: 98% 99% 98% 99%   Wt Readings from Last 3 Encounters:  04/19/15 83.462 kg (184 lb)  04/18/15 87.658 kg (193 lb 4 oz)  04/12/15 88.134 kg (194 lb 4.8 oz)    PHYSICAL EXAMINATION:  Physical Exam  Vitals reviewed. Constitutional: She is oriented to person, place, and time. She appears well-developed and well-nourished. No distress.  HENT:  Head: Normocephalic and atraumatic.  Mouth/Throat: Oropharynx is clear and moist.  Eyes: Conjunctivae and EOM are normal. Pupils are equal, round, and reactive to light. No scleral icterus.  Neck: Normal range of motion. Neck supple. No JVD present. No thyromegaly present.  Cardiovascular: Normal rate, regular rhythm and intact distal pulses.  Exam reveals no gallop and no friction rub.   No murmur heard. Respiratory: Effort normal and breath sounds normal. No respiratory distress. She has no wheezes. She has no rales.  GI: Soft. Bowel sounds are normal. She exhibits no distension. There is no tenderness.  Musculoskeletal: Normal range of motion. She exhibits no edema.  No arthritis, no gout  Lymphadenopathy:    She has no cervical adenopathy.  Neurological: She is alert and oriented to person, place, and time. No cranial nerve deficit.  No dysarthria, no aphasia  Skin: Skin is warm and dry. No rash noted. No erythema.  Psychiatric: She has a normal mood and affect. Her behavior is normal. Judgment and thought content normal.    LABORATORY PANEL:   CBC  Recent Labs Lab 04/19/15 1634  WBC 8.0  HGB 11.9*  HCT 34.8*  PLT 312   ------------------------------------------------------------------------------------------------------------------  Chemistries   Recent Labs Lab 04/19/15 1634 04/19/15 2040  NA 137 139  K 4.3 4.7  CL 100* 106  CO2 28 27  GLUCOSE 196* 264*  BUN 37*  40*  CREATININE 2.04* 2.30*  CALCIUM 9.8 9.0  MG 2.3  --   AST 20  --   ALT 18  --   ALKPHOS 59  --   BILITOT 0.5  --    ------------------------------------------------------------------------------------------------------------------  Cardiac Enzymes  Recent Labs Lab 04/19/15 2040  TROPONINI <0.03   ------------------------------------------------------------------------------------------------------------------  RADIOLOGY:  Dg Chest 2 View  04/18/2015  CLINICAL DATA:  Chest pain and shortness of breath, history of cardiomyopathy, coronary artery disease with stent placement, pleural effusions, COPD. EXAM: CHEST  2 VIEW COMPARISON:  PA and lateral chest x-ray of February 09, 2015 FINDINGS: The lungs are adequately inflated. There is no focal infiltrate. There is no pleural effusion. The interstitial markings are coarse though stable. The heart and pulmonary vascularity are normal. There is old deformity of the posterior lateral aspects of the left fourth and fifth ribs. The thoracic vertebral bodies are preserved in height. IMPRESSION: Mild stable prominence of the pulmonary interstitial markings. There is no alveolar pneumonia, pulmonary edema, nor other acute cardiopulmonary abnormality. Electronically Signed   By: Lillie Bollig  [place] M.D.   On: 04/18/2015 13:24   Dg Lumbar Spine 2-3 Views  04/19/2015  CLINICAL DATA:  Recent dizzy spell with fall, low back pain, initial encounter EXAM: LUMBAR SPINE - 2-3 VIEW COMPARISON:  None. FINDINGS: Five lumbar type vertebral bodies are well visualized. Vertebral body height is well maintained. Mild osteophytic changes are seen. No spondylolisthesis is noted. No soft tissue abnormality is seen. IMPRESSION: Mild degenerative change without acute abnormality. Electronically Signed   By: Inez Catalina M.D.   On: 04/19/2015 17:10   Dg Hip Unilat With Pelvis 2-3 Views Right  04/19/2015  CLINICAL DATA:  Recent dizziness with  and right hip pain, initial  encounter EXAM: DG HIP (WITH OR WITHOUT PELVIS) 2-3V RIGHT COMPARISON:  None. FINDINGS: The pelvic ring is intact. Degenerative changes of the hip joints are noted bilaterally. No acute fracture or dislocation is seen. No gross soft tissue abnormality is noted. IMPRESSION: No acute abnormality seen. Electronically Signed   By: Inez Catalina M.D.   On: 04/19/2015 17:05    EKG:   Orders placed or performed in visit on 04/18/15  . EKG 12-Lead    IMPRESSION AND PLAN:  Principal Problem:   Acute on chronic kidney failure (HCC) - patient's baseline creatinine is 1.4 1.5. In the ED it was 2 today, and then 2.3 on recheck, with a corresponding GFR falling from mid 40s to low 20s. Seems like her current insult may be prerenal and driven, given that she describes orthostatic symptoms related to her fall, and recently had an increase in her diuretic dosage. We'll hold her diuretics for tonight, very gently rehydrate her, check orthostatics, and get a cardiology consult for their opinion on proper diuretic dosing. Active Problems:   UTI (urinary tract infection) - questionable this time, she was given a dose of Rocephin in the ED and urine culture sent. Given that she has no urinary symptoms and her UA is indeterminate - nitrite negative, only trace leukocyte esterase and minimal white blood cells, will not continue antibiotics at this time but will follow up on her culture.   Fall - seems to have been driven by orthostasis, we'll hydrate gently and reevaluate in the morning   DM (diabetes mellitus), type 2, uncontrolled, with renal complications (HCC) - insulin, heart healthy/carb modified diet, sliding scale and corresponding glucose checks.   Essential hypertension - continue home meds except for those that are potentially nephrotoxic.   Chronic diastolic CHF (congestive heart failure) (HCC) - continue home meds except for diuretics as above   Depression with anxiety - continue home meds   Hyperlipidemia  LDL goal <100 - continue home antilipid  All the records are reviewed and case discussed with ED provider. Management plans discussed with the patient and/or family.  DVT PROPHYLAXIS: Subcutaneous heparin  ADMISSION STATUS: Inpatient  CODE STATUS: Full  TOTAL TIME TAKING CARE OF THIS PATIENT: 40 minutes.    Zsofia Prout FIELDING 04/19/2015, 10:42 PM  [facility]  Office  [phone]  CC: Primary care physician; [name], MD

## 2015-04-19 NOTE — ED Notes (Signed)
Pt has been weak and dizzy today. Fell. C/o left hip pain. Unable to bear weight after fall.  Left leg appears slightly shorter than right but able to lift off bed.

## 2015-04-19 NOTE — ED Notes (Signed)
Spoke with lab to add on urine culture 

## 2015-04-19 NOTE — ED Provider Notes (Signed)
[facility] Emergency Department Provider Note  ____________________________________________  Time seen: Approximately 4:41 PM  I have reviewed the triage vital signs and the nursing notes.   HISTORY  Chief Complaint Fall    HPI Pamela Collier is a 74 y.o. female whose medical history includes mild CHF, CAD, obesity, IDDM, who presents today by EMS after a fall.  She reports pain in her right hip (not the left as described initially in the triage note) and her lower back.  Additionally she reports that she has been feeling weak over the last several days which she believes led to her fall.  She has not been producing as much urine as usual recently.  She denies fever/chills, chest pain, shortness of breath, nausea, vomiting, abdominal pain.  His calves the pain in her hip and lower back as mild to moderate, acute onset, persistent, movement makes it better and nothing makes it worse.   Past Medical History  Diagnosis Date  . Diabetes mellitus   . 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 Cardiomyopathy, ischemic Jan 2013    EF 35 to 40% [facility])  . Hepatic steatosis     by CT abd pelvis  . Osteoporosis, post-menopausal   . 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) July 2011    s/p right 2nd toe amputation for gangrene  . Atherosclerotic peripheral vascular disease with gangrene [facility]) august 2012  . Hypertension   . Hyperlipidemia   . Sarcoidosis ([place])   . CHF (congestive heart failure) ([place])   . Renal insufficiency   . 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 CCF (congestive cardiac failure) ([place]) 08/17/2013  . Pleural effusion 10/25/2012    10/2012 CT chest >> small to moderate R lung effusion>> chylothorax, 100% lymphs 10/2013 thoracentesis> cytology negative, WBC 1471, > 90% "small lymphs" 01/2014 CT chest> near complete resolution of pleural effusion, stable lymphadenopathy  08/2014 CT chest [place] ([facility]): small right sided effusion decreased in size, stable mediastinal lymphadenopathy 1.0cm largest, 90m . 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 Acute respiratory failure ([place] 11/21/2014  . Diabetic diarrhea ([place] 10/03/2014  . DM type 2, uncontrolled, with renal complications ([place] 85/07/7480 . Cerebral infarct ([place] 08/17/2013  . Arthritis 08/05/2013  . 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 Hyperlipidemia LDL goal <100 02/23/2014  . COPD (chronic obstructive pulmonary disease) ([facility]     Patient Active Problem List   Diagnosis Date Noted  . Acute on chronic diastolic CHF (congestive heart failure) ([place] 04/18/2015  . Diabetic neuropathy ([place] 02/21/2015  . Shortness of breath 01/11/2015  . Diarrhea in adult patient   . Benign neoplasm of sigmoid colon   . UTI (urinary tract infection) 10/04/2014  . Diabetic diarrhea ([place] 10/03/2014  . Low back pain with radiation 04/04/2014  . Abdominal pain, suprapubic 04/04/2014  . Essential hypertension 04/04/2014  . Hyperlipidemia LDL goal <100 02/23/2014  . Long-term use of high-risk medication 02/23/2014  . OP (osteoporosis) 08/17/2013  . Dermatophytic onychia 08/17/2013  . Angina pectoris ([place] 08/17/2013  . CCF (congestive cardiac failure) ([place] 08/17/2013  . Arteriosclerosis of  coronary artery 08/17/2013  . Cerebral infarct ([place]) 08/17/2013  . Bony exostosis 08/17/2013  . Adiposity 08/17/2013  . Arthritis 08/05/2013  . Unsteady gait 03/24/2013  . History of colonic polyps 02/20/2013  . Need for prophylactic vaccination and inoculation against influenza 02/03/2013  . 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 Dysphagia 12/07/2012  . Pleural effusion  10/25/2012  . Cough 10/25/2012  . Hospital discharge follow-up 09/17/2012  . SOB (shortness of breath) 05/27/2012  . Pulmonary nodule seen on imaging study 05/20/2012  . 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 Cardiomyopathy, ischemic   . Hepatic steatosis   . Osteoporosis, post-menopausal   . 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 DM (diabetes mellitus), type 2, uncontrolled, with renal complications ([place]) 33/00/7622  . Double vessel coronary artery disease 12/14/2011    Past Surgical History  Procedure Laterality Date  . Abdominal hysterectomy      at ge 39. secondary to bleeding  . Toe amputation  Sept 2012    Right 2nd toe, [place]  . Ptca  August 2012    Right Posterior tibial artery , [place]  . Colonoscopy with propofol N/A 01/09/2015    Procedure: COLONOSCOPY WITH PROPOFOL;  Surgeon: Lucilla Lame, MD;  Location: [facility] ENDOSCOPY;  Service: Endoscopy;  Laterality: N/A;  . Cholecystectomy    . Carpal tunnel release Bilateral   . Breast cyst aspiration Right   . Coronary angioplasty with stent placement      [place]; [facility]    Current Outpatient Rx  Name  Route  Sig  Dispense  Refill  . albuterol (PROVENTIL) (2.5 MG/3ML) 0.083% nebulizer solution   Nebulization   Take 3 mLs (2.5 mg total) by nebulization every 6 (six) hours as needed for wheezing or shortness of breath.   150 mL   1   . alendronate (FOSAMAX) 70 MG tablet      Take with a full glass of water on an empty stomach every Sunday  Take with a full glass of water and take no other meds or food for   30 minutes .   12 tablet   1   . ALPRAZolam (XANAX) 0.5 MG tablet   Oral   Take 1 tablet (0.5 mg total) by mouth daily as needed for sleep or anxiety.   90 tablet   0   . aspirin 81 MG EC tablet   Oral   Take 1 tablet (81 mg total) by mouth daily. Swallow whole.   30 tablet   12   .  benzonatate (TESSALON) 100 MG capsule   Oral   Take 2 capsules (200 mg total) by mouth 3 (three) times daily as needed for cough.   90 capsule   0   . Blood Glucose Monitoring Suppl (ACCU-CHEK AVIVA) device      Use as instructed   1 each   0     DX code:E 11.29   . Blood Glucose Monitoring Suppl (TRUERESULT BLOOD GLUCOSE) W/DEVICE KIT   Does not apply   1 kit by Does not apply route 3 (three) times daily.   1 each   0     DX : 250.00   . BRILINTA 90 MG TABS tablet   Oral   Take 1 tablet (90 mg total) by mouth 2 (two) times daily.   180 tablet   2     Dispense as written.   [name]  budesonide-formoterol (SYMBICORT) 160-4.5 MCG/ACT inhaler   Inhalation   Inhale 2 puffs into the lungs 2 (two) times daily.   1 Inhaler   0   . COMBIVENT RESPIMAT 20-100 MCG/ACT AERS respimat   Inhalation   Inhale 1 puff into the lungs 3 (three) times daily as needed. Patient taking differently: Inhale 1 puff into the lungs 3 (three) times daily as needed for wheezing or shortness of breath.    2 Inhaler   3     Dispense as written.   . fluticasone (FLONASE) 50 MCG/ACT nasal spray   Each Nare   Place 2 sprays into both nostrils daily.   16 g   2   . furosemide (LASIX) 20 MG tablet   Oral   Take 1 tablet (20 mg total) by mouth daily.   90 tablet   1   . glucose blood (ACCU-CHEK AVIVA) test strip      Use up to three times a day.   100 each   11     DX code:E 11.29   . insulin aspart protamine - aspart (NOVOLOG 70/30 MIX) (70-30) 100 UNIT/ML FlexPen      60 units before breakfast,  60 units before dinner.  NEEDS 15 PENS PER MONTH Patient taking differently: Inject 44-50 Units into the skin See admin instructions. 44 units before breakfast and 50 units before supper   105 mL   2   . Insulin Pen Needle (B-D UF III MINI PEN NEEDLES) 31G X 5 MM MISC      Use  Three times daily   300 each   3   . isosorbide mononitrate (IMDUR) 30 MG 24 hr tablet   Oral   Take 1 tablet (30  mg total) by mouth daily.   90 tablet   1   . Lactulose 20 GM/30ML SOLN      30 ml every 4 hours until constipation is relieved Patient taking differently: Take 30 mLs by mouth every 4 (four) hours as needed (for constipation).    236 mL   3     Dispense as written.   . Lancet Devices (ACCU-CHEK SOFTCLIX) lancets      Utilize lancets three times a day to check blood glucose levels.   1 each   11     DX code:E 11.29   . losartan (COZAAR) 100 MG tablet   Oral   Take 1 tablet (100 mg total) by mouth daily.   90 tablet   2   . meloxicam (MOBIC) 15 MG tablet   Oral   Take 1 tablet (15 mg total) by mouth daily.   90 tablet   1   . metoprolol tartrate (LOPRESSOR) 25 MG tablet   Oral   Take 0.5 tablets (12.5 mg total) by mouth 2 (two) times daily.   180 tablet   1   . nitroGLYCERIN (NITROSTAT) 0.4 MG SL tablet   Sublingual   Place 1 tablet (0.4 mg total) under the tongue every 5 (five) minutes as needed for chest pain.   25 tablet   3   . PARoxetine (PAXIL) 10 MG tablet   Oral   Take 1 tablet (10 mg total) by mouth daily.   90 tablet   1   . pregabalin (LYRICA) 100 MG capsule   Oral   Take 1 capsule (100 mg total) by mouth 3 (three) times daily.   270 capsule   3   . rosuvastatin (CRESTOR) 20 MG   tablet   Oral   Take 1 tablet (20 mg total) by mouth daily. Patient taking differently: Take 20 mg by mouth at bedtime.    90 tablet   1   . Spacer/Aero-Holding Chambers (AEROCHAMBER MV) inhaler      Use as instructed   1 each   0   . traMADol (ULTRAM) 50 MG tablet   Oral   Take 1 tablet (50 mg total) by mouth every 6 (six) hours as needed for moderate pain.   120 tablet   3   . TRUEPLUS LANCETS 30G MISC      USE TO CHECK GLUCOSE THREE TIMES DAILY   400 each   0   . Vitamin D, Ergocalciferol, (DRISDOL) 50000 UNITS CAPS capsule   Oral   Take 50,000 Units by mouth every 7 (seven) days.           Allergies Contrast media; Gabapentin; and Sulfa  antibiotics  Family History  Problem Relation Age of Onset  . Heart disease Father   . Heart attack Father   . Heart disease Sister   . Heart attack Sister   . Heart disease Brother   . Heart attack Brother   . Asthma Grandchild   . Breast cancer Maternal Aunt     Social History Social History  Substance Use Topics  . Smoking status: Never Smoker   . Smokeless tobacco: Never Used  . Alcohol Use: No    Review of Systems Constitutional: No fever/chills.  Generalized weakness and general malaise Eyes: No visual changes. ENT: No sore throat. Cardiovascular: Denies chest pain. Respiratory: Denies shortness of breath. Gastrointestinal: No abdominal pain.  No nausea, no vomiting.  No diarrhea.  No constipation. Genitourinary: Negative for dysuria.  Decreased urination Musculoskeletal: Pain in lower back and right hip, now better than it was previously Skin: Negative for rash. Neurological: Negative for headaches, focal weakness or numbness.  10-point ROS otherwise negative.  ____________________________________________   PHYSICAL EXAM:  VITAL SIGNS: ED Triage Vitals  Enc Vitals Group     BP 04/19/15 1617 119/42 mmHg     Pulse Rate 04/19/15 1617 64     Resp 04/19/15 1617 22     Temp 04/19/15 1617 98.2 F (36.8 C)     Temp Source 04/19/15 1617 Oral     SpO2 04/19/15 1617 98 %     Weight 04/19/15 1617 184 lb (83.462 kg)     Height 04/19/15 1617 5' 6" (1.676 m)     Head Cir --      Peak Flow --      Pain Score 04/19/15 1616 5     Pain Loc --      Pain Edu? --      Excl. in GC? --     Constitutional: Alert and oriented.  Appears uncomfortable but not in acute distress Eyes: Conjunctivae are normal. PERRL. EOMI. Head: Atraumatic. Nose: No congestion/rhinnorhea. Mouth/Throat: Mucous membranes are moist.  Oropharynx non-erythematous. Neck: No stridor.   Cardiovascular: Normal rate, regular rhythm. Grossly normal heart sounds.  Good peripheral  circulation. Respiratory: Normal respiratory effort.  No retractions. Lungs CTAB. Gastrointestinal: Obese, Soft and nontender. No distention. No abdominal bruits. No CVA tenderness. Musculoskeletal: No significant pain or tenderness to palpation of her pelvis.  No significant tenderness to ROM in RLE. Neurologic:  Normal speech and language. No gross focal neurologic deficits are appreciated.  Skin:  Skin is warm, dry and intact. No rash noted.   ____________________________________________     LABS (all labs ordered are listed, but only abnormal results are displayed)  Labs Reviewed  URINALYSIS COMPLETEWITH MICROSCOPIC ([facility] ONLY) - Abnormal; Notable for the following:    Color, Urine YELLOW (*)    APPearance CLOUDY (*)    Ketones, ur TRACE (*)    Protein, ur 100 (*)    Leukocytes, UA TRACE (*)    Bacteria, UA MANY (*)    Squamous Epithelial / LPF 0-5 (*)    All other components within normal limits  COMPREHENSIVE METABOLIC PANEL - Abnormal; Notable for the following:    Chloride 100 (*)    Glucose, Bld 196 (*)    BUN 37 (*)    Creatinine, Ser 2.04 (*)    GFR calc non Af Amer 23 (*)    GFR calc Af Amer 26 (*)    All other components within normal limits  CBC WITH DIFFERENTIAL/PLATELET - Abnormal; Notable for the following:    Hemoglobin 11.9 (*)    HCT 34.8 (*)    RDW 15.2 (*)    All other components within normal limits  BASIC METABOLIC PANEL - Abnormal; Notable for the following:    Glucose, Bld 264 (*)    BUN 40 (*)    Creatinine, Ser 2.30 (*)    GFR calc non Af Amer 20 (*)    GFR calc Af Amer 23 (*)    All other components within normal limits  URINE CULTURE  TROPONIN I  LIPASE, BLOOD  MAGNESIUM  TROPONIN I   ____________________________________________  EKG  ED ECG REPORT I, Tsugio Elison, the attending physician, personally viewed and interpreted this ECG.   Date: 04/19/2015  EKG Time: 16:22  Rate: 64  Rhythm: Junctional rhythm  Axis: Left axis  deviation  Intervals:Junctional rhythm, no appreciable P waves  ST&T Change: Non-specific ST segment / T-wave changes, but no evidence of acute ischemia.   ____________________________________________  RADIOLOGY   Dg Lumbar Spine 2-3 Views  04/19/2015  CLINICAL DATA:  Recent dizzy spell with fall, low back pain, initial encounter EXAM: LUMBAR SPINE - 2-3 VIEW COMPARISON:  None. FINDINGS: Five lumbar type vertebral bodies are well visualized. Vertebral body height is well maintained. Mild osteophytic changes are seen. No spondylolisthesis is noted. No soft tissue abnormality is seen. IMPRESSION: Mild degenerative change without acute abnormality. Electronically Signed   By: Inez Catalina M.D.   On: 04/19/2015 17:10   Dg Hip Unilat With Pelvis 2-3 Views Right  04/19/2015  CLINICAL DATA:  Recent dizziness with and right hip pain, initial encounter EXAM: DG HIP (WITH OR WITHOUT PELVIS) 2-3V RIGHT COMPARISON:  None. FINDINGS: The pelvic ring is intact. Degenerative changes of the hip joints are noted bilaterally. No acute fracture or dislocation is seen. No gross soft tissue abnormality is noted. IMPRESSION: No acute abnormality seen. Electronically Signed   By: Inez Catalina M.D.   On: 04/19/2015 17:05    ____________________________________________   PROCEDURES  Procedure(s) performed: None  Critical Care performed: No ____________________________________________   INITIAL IMPRESSION / ASSESSMENT AND PLAN / ED COURSE  Pertinent labs & imaging results that were available during my care of the patient were reviewed by me and considered in my medical decision making (see chart for details).  Initially the patient's workup was unremarkable but I was concerned about the fact that she has an elevated creatinine and a significantly decreased GFR compared to prior values.  I thought maybe this was from moderate volume depletion and I gave her a 500 mL  normal saline bolus but her creatinine did  not improve and in fact worsened slightly upon recheck.  Her troponin was negative 2.  I will admit her for acute kidney injury superimposed on her chronic kidney disease.  ____________________________________________  FINAL CLINICAL IMPRESSION(S) / ED DIAGNOSES  Final diagnoses:  Generalized weakness  Acute kidney injury superimposed on chronic kidney disease ([place])      NEW MEDICATIONS STARTED DURING THIS VISIT:  New Prescriptions   No medications on file     Hinda Kehr, MD 04/21/15 0020

## 2015-04-20 ENCOUNTER — Inpatient Hospital Stay: Payer: Medicare Other

## 2015-04-20 DIAGNOSIS — N189 Chronic kidney disease, unspecified: Secondary | ICD-10-CM

## 2015-04-20 DIAGNOSIS — N179 Acute kidney failure, unspecified: Principal | ICD-10-CM

## 2015-04-20 DIAGNOSIS — I5032 Chronic diastolic (congestive) heart failure: Secondary | ICD-10-CM

## 2015-04-20 LAB — CBC
HCT: 30.4 % — ABNORMAL LOW (ref 35.0–47.0)
HEMOGLOBIN: 10.2 g/dL — AB (ref 12.0–16.0)
MCH: 29.6 pg (ref 26.0–34.0)
MCHC: 33.5 g/dL (ref 32.0–36.0)
MCV: 88.3 fL (ref 80.0–100.0)
Platelets: 242 10*3/uL (ref 150–440)
RBC: 3.45 MIL/uL — AB (ref 3.80–5.20)
RDW: 15.3 % — ABNORMAL HIGH (ref 11.5–14.5)
WBC: 6.8 10*3/uL (ref 3.6–11.0)

## 2015-04-20 LAB — GLUCOSE, CAPILLARY
GLUCOSE-CAPILLARY: 225 mg/dL — AB (ref 65–99)
Glucose-Capillary: 267 mg/dL — ABNORMAL HIGH (ref 65–99)
Glucose-Capillary: 163 mg/dL — ABNORMAL HIGH (ref 65–99)
Glucose-Capillary: 283 mg/dL — ABNORMAL HIGH (ref 65–99)
Glucose-Capillary: 189 mg/dL — ABNORMAL HIGH (ref 65–99)

## 2015-04-20 LAB — BASIC METABOLIC PANEL
ANION GAP: 7 (ref 5–15)
BUN: 47 mg/dL — ABNORMAL HIGH (ref 6–20)
CALCIUM: 9.1 mg/dL (ref 8.9–10.3)
CHLORIDE: 108 mmol/L (ref 101–111)
CO2: 25 mmol/L (ref 22–32)
CREATININE: 2.16 mg/dL — AB (ref 0.44–1.00)
GFR calc non Af Amer: 21 mL/min — ABNORMAL LOW (ref >60)
GFR, EST AFRICAN AMERICAN: 25 mL/min — AB (ref >60)
GLUCOSE: 164 mg/dL — AB (ref 65–99)
Potassium: 4.3 mmol/L (ref 3.5–5.1)
Sodium: 140 mmol/L (ref 135–145)

## 2015-04-20 MED ORDER — BUDESONIDE-FORMOTEROL FUMARATE 160-4.5 MCG/ACT IN AERO
2.0000 | INHALATION_SPRAY | Freq: Two times a day (BID) | RESPIRATORY_TRACT | Status: DC
  Administered 2015-04-20 – 2015-04-22 (×4): 2 via RESPIRATORY_TRACT
  Filled 2015-04-20: qty 6

## 2015-04-20 MED ORDER — ASPIRIN EC 81 MG PO TBEC
81.0000 mg | DELAYED_RELEASE_TABLET | Freq: Every day | ORAL | Status: DC
  Administered 2015-04-20 – 2015-04-22 (×3): 81 mg via ORAL
  Filled 2015-04-20 (×3): qty 1

## 2015-04-20 MED ORDER — TICAGRELOR 90 MG PO TABS
90.0000 mg | ORAL_TABLET | Freq: Two times a day (BID) | ORAL | Status: DC
  Administered 2015-04-20 – 2015-04-22 (×6): 90 mg via ORAL
  Filled 2015-04-20 (×6): qty 1

## 2015-04-20 MED ORDER — ONDANSETRON HCL 4 MG/2ML IJ SOLN: 4.0000 mg | Freq: Four times a day (QID) | INTRAMUSCULAR | Status: DC | PRN

## 2015-04-20 MED ORDER — ONDANSETRON HCL 4 MG/2ML IJ SOLN
INTRAMUSCULAR | Status: AC
  Filled 2015-04-20: qty 2

## 2015-04-20 MED ORDER — METOPROLOL TARTRATE 25 MG PO TABS
12.5000 mg | ORAL_TABLET | Freq: Two times a day (BID) | ORAL | Status: DC
  Administered 2015-04-20 – 2015-04-21 (×3): 12.5 mg via ORAL
  Administered 2015-04-21: 25 mg via ORAL
  Administered 2015-04-22: 12.5 mg via ORAL
  Filled 2015-04-20 (×6): qty 1

## 2015-04-20 MED ORDER — INSULIN GLARGINE 100 UNIT/ML ~~LOC~~ SOLN
20.0000 [IU] | Freq: Every day | SUBCUTANEOUS | Status: DC
  Administered 2015-04-20 – 2015-04-21 (×3): 20 [IU] via SUBCUTANEOUS
  Filled 2015-04-20 (×4): qty 0.2

## 2015-04-20 MED ORDER — INSULIN ASPART 100 UNIT/ML ~~LOC~~ SOLN
0.0000 [IU] | Freq: Every day | SUBCUTANEOUS | Status: DC
  Administered 2015-04-20: 3 [IU] via SUBCUTANEOUS
  Filled 2015-04-20: qty 3

## 2015-04-20 MED ORDER — ROSUVASTATIN CALCIUM 20 MG PO TABS
20.0000 mg | ORAL_TABLET | Freq: Every day | ORAL | Status: DC
  Administered 2015-04-20 – 2015-04-21 (×3): 20 mg via ORAL
  Filled 2015-04-20 (×3): qty 1

## 2015-04-20 MED ORDER — ALBUTEROL SULFATE (2.5 MG/3ML) 0.083% IN NEBU: 2.5000 mg | INHALATION_SOLUTION | Freq: Four times a day (QID) | RESPIRATORY_TRACT | Status: DC | PRN

## 2015-04-20 MED ORDER — CEFUROXIME AXETIL 500 MG PO TABS
250.0000 mg | ORAL_TABLET | Freq: Two times a day (BID) | ORAL | Status: DC
  Administered 2015-04-20 (×2): 250 mg via ORAL
  Filled 2015-04-20 (×2): qty 1

## 2015-04-20 MED ORDER — SODIUM CHLORIDE 0.9 % IV SOLN
INTRAVENOUS | Status: AC
  Administered 2015-04-20: 02:00:00 via INTRAVENOUS

## 2015-04-20 MED ORDER — ACETAMINOPHEN 325 MG PO TABS
650.0000 mg | ORAL_TABLET | Freq: Four times a day (QID) | ORAL | Status: DC | PRN
  Administered 2015-04-20 – 2015-04-22 (×6): 650 mg via ORAL
  Filled 2015-04-20 (×7): qty 2

## 2015-04-20 MED ORDER — INSULIN ASPART 100 UNIT/ML ~~LOC~~ SOLN
0.0000 [IU] | Freq: Three times a day (TID) | SUBCUTANEOUS | Status: DC
  Administered 2015-04-20: 3 [IU] via SUBCUTANEOUS
  Administered 2015-04-20: 5 [IU] via SUBCUTANEOUS
  Administered 2015-04-20: 8 [IU] via SUBCUTANEOUS
  Administered 2015-04-21: 2 [IU] via SUBCUTANEOUS
  Administered 2015-04-21: 3 [IU] via SUBCUTANEOUS
  Administered 2015-04-21: 8 [IU] via SUBCUTANEOUS
  Administered 2015-04-22: 3 [IU] via SUBCUTANEOUS
  Administered 2015-04-22: 2 [IU] via SUBCUTANEOUS
  Filled 2015-04-20: qty 8
  Filled 2015-04-20: qty 2
  Filled 2015-04-20: qty 3
  Filled 2015-04-20: qty 2
  Filled 2015-04-20: qty 5
  Filled 2015-04-20 (×2): qty 8
  Filled 2015-04-20 (×2): qty 3

## 2015-04-20 MED ORDER — ONDANSETRON HCL 4 MG PO TABS: 4.0000 mg | ORAL_TABLET | Freq: Four times a day (QID) | ORAL | Status: DC | PRN

## 2015-04-20 MED ORDER — PAROXETINE HCL 10 MG PO TABS
10.0000 mg | ORAL_TABLET | Freq: Every day | ORAL | Status: DC
  Administered 2015-04-20 – 2015-04-22 (×3): 10 mg via ORAL
  Filled 2015-04-20 (×3): qty 1

## 2015-04-20 MED ORDER — SODIUM CHLORIDE 0.9 % IJ SOLN
3.0000 mL | Freq: Two times a day (BID) | INTRAMUSCULAR | Status: DC
  Administered 2015-04-20 – 2015-04-22 (×5): 3 mL via INTRAVENOUS

## 2015-04-20 MED ORDER — ISOSORBIDE MONONITRATE ER 30 MG PO TB24
30.0000 mg | ORAL_TABLET | Freq: Every day | ORAL | Status: DC
  Administered 2015-04-20 – 2015-04-22 (×3): 30 mg via ORAL
  Filled 2015-04-20 (×3): qty 1

## 2015-04-20 MED ORDER — HEPARIN SODIUM (PORCINE) 5000 UNIT/ML IJ SOLN
5000.0000 [IU] | Freq: Three times a day (TID) | INTRAMUSCULAR | Status: DC
  Administered 2015-04-20 – 2015-04-22 (×9): 5000 [IU] via SUBCUTANEOUS
  Filled 2015-04-20 (×9): qty 1

## 2015-04-20 MED ORDER — PREGABALIN 75 MG PO CAPS
100.0000 mg | ORAL_CAPSULE | Freq: Three times a day (TID) | ORAL | Status: DC
  Administered 2015-04-20 – 2015-04-22 (×7): 100 mg via ORAL
  Filled 2015-04-20 (×7): qty 1

## 2015-04-20 MED ORDER — FENTANYL CITRATE (PF) 100 MCG/2ML IJ SOLN
INTRAMUSCULAR | Status: AC
  Filled 2015-04-20: qty 2

## 2015-04-20 MED ORDER — ACETAMINOPHEN 650 MG RE SUPP: 650.0000 mg | Freq: Four times a day (QID) | RECTAL | Status: DC | PRN

## 2015-04-20 MED ORDER — IPRATROPIUM-ALBUTEROL 0.5-2.5 (3) MG/3ML IN SOLN: 3.0000 mL | Freq: Three times a day (TID) | RESPIRATORY_TRACT | Status: DC | PRN

## 2015-04-20 NOTE — Progress Notes (Signed)
Initial Nutrition Assessment     INTERVENTION:  Meals and snacks: Cater to pt preferences Medical Nutrition Supplement Therapy: If unable to meet nutritional needs will add supplement   NUTRITION DIAGNOSIS:    (None at this time) related to   as evidenced by  .    GOAL:   Patient will meet greater than or equal to 90% of their needs    MONITOR:    (Energy intake, Electrolyte and renal profile)  REASON FOR ASSESSMENT:   Diagnosis    ASSESSMENT:      Pt admitted with Acute on chronic renal failure, recent fall prior to admission, weakness.  Currently out of room for CT, possible stroke  Past Medical History  Diagnosis Date  . Diabetes mellitus   . 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 Cardiomyopathy, ischemic Jan 2013    EF 35 to 40% [facility])  . Hepatic steatosis     by CT abd pelvis  . Osteoporosis, post-menopausal   . 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 Atherosclerotic peripheral vascular disease with gangrene [facility]) august 2012  . Hypertension   . Hyperlipidemia   . Sarcoidosis ([place])   . CHF (congestive heart failure) ([place])   . Renal insufficiency   . 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 CCF (congestive cardiac failure) ([place]) 08/17/2013  . Pleural effusion 10/25/2012    10/2012 CT chest >> small to moderate R lung effusion>> chylothorax, 100% lymphs 10/2013 thoracentesis> cytology negative, WBC 1471, > 90% "small lymphs" 01/2014 CT chest> near complete resolution of pleural effusion, stable lymphadenopathy 08/2014 CT chest [place] ([facility]): small right sided effusion decreased in size, stable mediastinal lymphadenopathy 1.0cm largest, 57m  . 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 Acute respiratory failure ([place]) 11/21/2014  . Diabetic diarrhea ([place]) 10/03/2014  . DM type 2, uncontrolled, with renal complications ([place]) [id number]  . Cerebral infarct ([place]) 08/17/2013  . Arthritis 08/05/2013  . 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 Hyperlipidemia LDL goal <100 02/23/2014  . COPD (chronic obstructive pulmonary disease) ([facility])     Current Nutrition: noted ate 100% of breakfast this am  Food/Nutrition-Related History: unable to determine intake prior to admission.  Per MST normal intake   Scheduled Medications:  . aspirin EC  81 mg Oral Daily  . budesonide-formoterol  2 puff Inhalation BID  . cefUROXime  250 mg Oral BID WC  . heparin  5,000 Units Subcutaneous 3 times per day  . insulin aspart  0-15 Units Subcutaneous TID WC  . insulin aspart  0-5 Units Subcutaneous QHS  . insulin glargine  20 Units Subcutaneous QHS  . isosorbide mononitrate  30 mg Oral Daily  . metoprolol tartrate  12.5 mg Oral BID  . PARoxetine  10 mg Oral Daily  . pregabalin  100 mg Oral TID  . rosuvastatin  20 mg Oral QHS  . sodium chloride  3 mL Intravenous Q12H  . ticagrelor  90 mg Oral BID       Electrolyte/Renal Profile and Glucose Profile:  Recent Labs Lab 04/19/15 1634 04/19/15 2040 04/20/15 0628  NA 137 139 140  K 4.3 4.7 4.3  CL 100* 106 108  CO2 28 27 25   BUN 37* 40* 47*  CREATININE 2.04* 2.30* 2.16*  CALCIUM 9.8 9.0 9.1  MG 2.3  --   --   GLUCOSE 196* 264* 164*   Protein Profile:  Recent Labs Lab 04/19/15 1634  ALBUMIN 3.7    Gastrointestinal Profile: Last BM:12/6   Nutrition-Focused Physical Exam Findings: Nutrition-Focused physical exam completed. Findings are WDL for fat depletion, muscle depletion, and edema.     Weight Change: stable wt per wt encounters (slight wt gain)    Diet Order:  Diet heart healthy/carb modified Room service  appropriate?: Yes; Fluid consistency:: Thin  Skin:   reviewed   Height:   Ht Readings from Last 1 Encounters:  04/19/15 5\' 6"  (1.676 m)    Weight:   Wt Readings from Last 1 Encounters:  04/20/15 196 lb 11.2 oz (89.223 kg)    Ideal Body Weight:     BMI:  Body mass index is 31.76 kg/(m^2).   EDUCATION NEEDS:   No education needs identified at this time  LOW Care Level  Karel Turpen B. Zenia Resides, [place], [place] (pager)

## 2015-04-20 NOTE — Progress Notes (Signed)
Patient A/O, no noted distress. Patient noted she isn't in pain, just sore. Patient normally ambulated with cane. Staff have ambulated patient to the bathroom with one assist. No skin issues. Healed scattered bruising on BLE Patient takes meds whole and tolerated well. Staff will continue to monitor and meet needs.

## 2015-04-20 NOTE — Progress Notes (Addendum)
[facility] [name]    MR#:  [id number]  DATE OF BIRTH:  12-05-1940  SUBJECTIVE:   No acute issues overnight. Patient has some weakness. Patient has good urine output. She denies abdominal pain or nausea.  REVIEW OF SYSTEMS:    Review of Systems  Constitutional: Positive for malaise/fatigue. Negative for fever and chills.  HENT: Negative for sore throat.   Eyes: Negative for blurred vision.  Respiratory: Negative for cough, hemoptysis, shortness of breath and wheezing.   Cardiovascular: Negative for chest pain, palpitations and leg swelling.  Gastrointestinal: Negative for nausea, vomiting, abdominal pain, diarrhea and blood in stool.  Genitourinary: Negative for dysuria.  Musculoskeletal: Negative for back pain.  Neurological: Positive for weakness. Negative for dizziness, tremors and headaches.  Endo/Heme/Allergies: Does not bruise/bleed easily.    Tolerating Diet: YES      DRUG ALLERGIES:   Allergies  Allergen Reactions  . Contrast Media [Iodinated Diagnostic Agents] Itching  . Gabapentin Itching  . Sulfa Antibiotics Itching    VITALS:  Blood pressure 152/46, pulse 52, temperature 97.7 F (36.5 C), temperature source Oral, resp. rate 16, height 5\' 6"  (1.676 m), weight 89.223 kg (196 lb 11.2 oz), SpO2 96 %.  PHYSICAL EXAMINATION:   Physical Exam  Constitutional: She is oriented to person, place, and time and well-developed, well-nourished, and in no distress. No distress.  HENT:  Head: Normocephalic.  Eyes: No scleral icterus.  Neck: Normal range of motion. Neck supple. No JVD present. No tracheal deviation present.  Cardiovascular: Normal rate, regular rhythm and normal heart sounds.  Exam reveals no gallop and no friction rub.   No murmur heard. Pulmonary/Chest: Effort normal and breath sounds normal. No respiratory distress. She has no wheezes. She has no rales. She exhibits no  tenderness.  Abdominal: Soft. Bowel sounds are normal. She exhibits no distension and no mass. There is no tenderness. There is no rebound and no guarding.  Musculoskeletal: Normal range of motion. She exhibits no edema.  Neurological: She is alert and oriented to person, place, and time.  Left leg weak  Skin: Skin is warm. No rash noted. No erythema.  Psychiatric: Affect and judgment normal.      LABORATORY PANEL:   CBC  Recent Labs Lab 04/20/15 0628  WBC 6.8  HGB 10.2*  HCT 30.4*  PLT 242   ------------------------------------------------------------------------------------------------------------------  Chemistries   Recent Labs Lab 04/19/15 1634  04/20/15 0628  NA 137  < > 140  K 4.3  < > 4.3  CL 100*  < > 108  CO2 28  < > 25  GLUCOSE 196*  < > 164*  BUN 37*  < > 47*  CREATININE 2.04*  < > 2.16*  CALCIUM 9.8  < > 9.1  MG 2.3  --   --   AST 20  --   --   ALT 18  --   --   ALKPHOS 59  --   --   BILITOT 0.5  --   --   < > = values in this interval not displayed. ------------------------------------------------------------------------------------------------------------------  Cardiac Enzymes  Recent Labs Lab 04/19/15 1634 04/19/15 2040  TROPONINI 0.03 <0.03   ------------------------------------------------------------------------------------------------------------------  RADIOLOGY:  Dg Chest 2 View  04/18/2015  CLINICAL DATA:  Chest pain and shortness of breath, history of cardiomyopathy, coronary artery disease with stent placement, pleural effusions, COPD. EXAM: CHEST  2 VIEW COMPARISON:  PA and lateral chest x-ray of  February 09, 2015 FINDINGS: The lungs are adequately inflated. There is no focal infiltrate. There is no pleural effusion. The interstitial markings are coarse though stable. The heart and pulmonary vascularity are normal. There is old deformity of the posterior lateral aspects of the left fourth and fifth ribs. The thoracic vertebral  bodies are preserved in height. IMPRESSION: Mild stable prominence of the pulmonary interstitial markings. There is no alveolar pneumonia, pulmonary edema, nor other acute cardiopulmonary abnormality. Electronically Signed   By: David  Martinique M.D.   On: 04/18/2015 13:24   Dg Lumbar Spine 2-3 Views  04/19/2015  CLINICAL DATA:  Recent dizzy spell with fall, low back pain, initial encounter EXAM: LUMBAR SPINE - 2-3 VIEW COMPARISON:  None. FINDINGS: Five lumbar type vertebral bodies are well visualized. Vertebral body height is well maintained. Mild osteophytic changes are seen. No spondylolisthesis is noted. No soft tissue abnormality is seen. IMPRESSION: Mild degenerative change without acute abnormality. Electronically Signed   By: Inez Catalina M.D.   On: 04/19/2015 17:10   Dg Hip Unilat With Pelvis 2-3 Views Right  04/19/2015  CLINICAL DATA:  Recent dizziness with and right hip pain, initial encounter EXAM: DG HIP (WITH OR WITHOUT PELVIS) 2-3V RIGHT COMPARISON:  None. FINDINGS: The pelvic ring is intact. Degenerative changes of the hip joints are noted bilaterally. No acute fracture or dislocation is seen. No gross soft tissue abnormality is noted. IMPRESSION: No acute abnormality seen. Electronically Signed   By: Inez Catalina M.D.   On: 04/19/2015 17:05     ASSESSMENT AND PLAN:    74 year old female with a history of ischemic cardiomyopathy, diabetes and CAD who presented after fall and was found to have acute renal failure.   1. Acute on chronic renal failure stage III: Patient's pacing creatinine is 1.4. Creatinine is improving with IV fluids. I suspect this is due to prerenal azotemia secondary to dehydration and orthostasis from her diuretic. Continue to hold diuretics and avoid nephrotoxic agents. Continue IV fluids today.  2. Urinary tract infection: I will add Ceftin. Follow up on urine culture.  3. Diabetes type II: Continue sliding scale insulin, ADA diet and Lantus.  4. Ischemic  cardiomyopathy: Continue Imdur, metoprolol, per Lantus and aspirin.       Management plans discussed with the patient and she is in agreement.  CODE STATUS: FULL  TOTAL TIME TAKING CARE OF THIS PATIENT: 30 minutes.     POSSIBLE D/C tomorrow, DEPENDING ON CLINICAL CONDITION.   Dayden Viverette M.D on 04/20/2015 at 10:42 AM  Between 7am to 6pm - Pager - [phone] After 6pm go to [email or web address] - password [email or web address] [facility]  Office  [phone]  CC: Primary care physician; [name], MD  Note: This dictation was prepared with Dragon dictation along with smaller phrase technology. Any transcriptional errors that result from this process are unintentional.

## 2015-04-20 NOTE — ED Notes (Signed)
ED tech notified that pt is ready for transport to 2 [facility]

## 2015-04-20 NOTE — Evaluation (Signed)
Physical Therapy Evaluation Patient Details [name] MRN: [id number] DOB: [birth year] Today's Date: 04/20/2015   History of Present Illness  Pt had a fall, found to have acute on chronic kidney issues and has since started having L LE weakness and sensory changes  Clinical Impression  Pt is fearful of having had a stroke effecting her L LE, imaging appears negative thus far...  Pt did have coordination, strength and sensation deficits in the L LE and generally reports no being close to her baseline.  She is very reliant on the walker (uses just a cane at baseline) and lacks confidence and had some unsteadiness.  She will likely need STR though she would rather go home.     Follow Up Recommendations SNF    Equipment Recommendations       Recommendations for Other Services       Precautions / Restrictions Precautions Precautions: Fall Restrictions Weight Bearing Restrictions: No      Mobility  Bed Mobility Overal bed mobility: Needs Assistance Bed Mobility: Supine to Sit;Sit to Supine     Supine to sit: Min assist Sit to supine: Min assist   General bed mobility comments: Pt with labored effort to get to EOB and back into bed, but is able to do so with only min assist  Transfers Overall transfer level: Needs assistance Equipment used: Rolling walker (2 wheeled) Transfers: Sit to/from Stand Sit to Stand: Min assist         General transfer comment: Pt is able to rise with limited assist, but needs cues to keep L foot on the floor and under her to be able to assist.    Ambulation/Gait Ambulation/Gait assistance: Min assist;Mod assist Ambulation Distance (Feet): 20 Feet Assistive device: Rolling walker (2 wheeled)       General Gait Details: Pt with decreased coordination on the L and though she does not have any losses of balance she lacks confidence and does have moments where she is unsteady or unable to appropriately advance LE/use  walker.  Stairs            Wheelchair Mobility    Modified Rankin (Stroke Patients Only)       Balance                                             Pertinent Vitals/Pain Pain Assessment: No/denies pain    Home Living Family/patient expects to be discharged to:: Private residence (will likely need STR however) Living Arrangements: Spouse/significant other Available Help at Discharge: Family Type of Home: House       Home Layout:  (3 steps to kitchen area) Home Equipment: Kasandra Knudsen - single point;Walker - 2 wheels (typically uses the walker)      Prior Function Level of Independence: Independent with assistive device(s)         Comments: she reports that she is normally able to get out of the house with some regularity     Hand Dominance        Extremity/Trunk Assessment   Upper Extremity Assessment: Generalized weakness (strength appears = bilaterally, some L coordination issues)           Lower Extremity Assessment: Generalized weakness (L grossly 3/5 with decreased quality of motion, R 4-/5)         Communication   Communication: No difficulties (weak voice with some wavering)  Cognition Arousal/Alertness: Awake/alert Behavior During Therapy: Restless Overall Cognitive Status: Within Functional Limits for tasks assessed                      General Comments      Exercises        Assessment/Plan    PT Assessment Patient needs continued PT services  PT Diagnosis Difficulty walking;Generalized weakness   PT Problem List Decreased strength;Decreased range of motion;Decreased activity tolerance;Decreased balance;Decreased mobility;Decreased coordination;Decreased knowledge of use of DME;Decreased safety awareness  PT Treatment Interventions     PT Goals (Current goals can be found in the Care Plan section) Acute Rehab PT Goals Patient Stated Goal: I don't want to be at rehab too long PT Goal Formulation: With  patient Time For Goal Achievement: 05/04/15 Potential to Achieve Goals: Fair    Frequency Min 2X/week   Barriers to discharge        Co-evaluation               End of Session Equipment Utilized During Treatment: Gait belt Activity Tolerance: Patient limited by fatigue Patient left: with bed alarm set Nurse Communication: Mobility status         Time: 1422-1450 PT Time Calculation (min) (ACUTE ONLY): 28 min   Charges:   PT Evaluation $Initial PT Evaluation Tier I: 1 Procedure     PT G Codes:       Wayne Both, PT, DPT [phone]  Kreg Shropshire 04/20/2015, 4:10 PM

## 2015-04-20 NOTE — Progress Notes (Signed)
Inpatient Diabetes Program Recommendations  AACE/ADA: New Consensus Statement on Inpatient Glycemic Control (2015)  Target Ranges:  Prepandial:   less than 140 mg/dL      Peak postprandial:   less than 180 mg/dL (1-2 hours)      Critically ill patients:  140 - 180 mg/dL   Review of Glycemic Control:  Results for Pamela Collier, Pamela Collier (MRN [id number]) as of 04/20/2015 13:23  Ref. Range 04/20/2015 01:24 04/20/2015 07:34 04/20/2015 11:26  Glucose-Capillary Latest Ref Range: 65-99 mg/dL 267 (H) 163 (H) 283 (H)   Diabetes history: Type 2 diabetes Outpatient Diabetes medications: Novolog 70/30 44 units breakfast/50 units q HS Current orders for Inpatient glycemic control:  Lantus 20 units daily, Novolog moderate tid with meals and HS  Inpatient Diabetes Program Recommendations:   May consider increasing Lantus to 30 units daily and adding Novolog meal coverage 6 units tid with meals (hold if patient eats < 50%).  Thanks, Adah Perl, RN, BC-ADM Inpatient Diabetes Coordinator Pager [phone] (8a-5p)

## 2015-04-20 NOTE — Consult Note (Signed)
CARDIOLOGY CONSULT NOTE  Patient ID: Pamela Collier MRN: [id number] DOB/AGE: 07/06/1940 74 y.o.  Admit date: 04/19/2015  Primary Cardiologist : Dr. Rockey Situ Reason for Consultation : Management of chronic heart failure.  HPI:  This is a 74 year old African-American female with known history of coronary artery disease with previous multivessel stenting, type 2 diabetes, pulmonary sarcoidosis and chronic systolic heart failure. She was seen recently by Dr.Gollan with increased symptoms of shortness of breath and wheezing. The patient was thought to be mildly fluid overloaded. The dose of Lasix was increased to 20 mg twice daily. She underwent an echocardiogram which showed low-normal LV systolic function with an ejection fraction of 50-55% and no significant pulmonary hypertension. Patient was hospitalized after a fall. She reports that she did not pass out but she was extremely dizzy.  Her labs showed acute on chronic renal failure with a creatinine of 2.3. Previous creatinine was 1.5. She was also found to have urinary tract infection. Her diuretics have been on hold. She is feeling better.   Review of systems complete and found to be negative unless listed above   Past Medical History  Diagnosis Date  . Diabetes mellitus   . 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 Cardiomyopathy, ischemic Jan 2013    EF 35 to 40% [facility])  . Hepatic steatosis     by CT abd pelvis  . Osteoporosis, post-menopausal   . 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 Atherosclerotic peripheral vascular disease with gangrene [facility]) august 2012  . Hypertension   . Hyperlipidemia   . Sarcoidosis ([place])   . CHF (congestive heart failure) ([place])   . Renal insufficiency   . 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 [facility] (congestive cardiac failure) ([place]) 08/17/2013  . Pleural  effusion 10/25/2012    10/2012 CT chest >> small to moderate R lung effusion>> chylothorax, 100% lymphs 10/2013 thoracentesis> cytology negative, WBC 1471, > 90% "small lymphs" 01/2014 CT chest> near complete resolution of pleural effusion, stable lymphadenopathy 08/2014 CT chest [place] ([facility]): small right sided effusion decreased in size, stable mediastinal lymphadenopathy 1.0cm largest, 41m . Pulmonary sarcoidosis ([place] 12/07/2012    [place] 03[place]2013 Full PFT [facility] > UNACCEPTABLE AND NOT REPRODUCIBLE DATA> Ratio 71% FEV 1 1.02 L (55% pred), FVC 1.31 L (49% pred) could not do lung volumes or DLCO   . Acute respiratory failure ([place] 11/21/2014  . Diabetic diarrhea ([facility] 10/03/2014  . DM type 2, uncontrolled, with renal complications ([place] 84/08/100 . Cerebral infarct ([facility] 08/17/2013  . Arthritis 08/05/2013  . 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 Hyperlipidemia LDL goal <100 02/23/2014  . COPD (chronic obstructive pulmonary disease) ([facility])     Family History  Problem Relation Age of Onset  . Heart disease Father   . Heart attack Father   . Heart disease Sister   . Heart attack Sister   . Heart disease Brother   . Heart attack Brother   . Asthma Grandchild   . Breast cancer Maternal Aunt     Social History   Social History  . Marital Status: Married    Spouse Name: N/A  . Number of Children: N/A  . Years of Education: N/A   Occupational History  .  Not on file.   Social History Main Topics  . Smoking status: Never Smoker   . Smokeless tobacco: Never Used  . Alcohol Use: No  . Drug Use: No  . Sexual Activity: Yes   Other Topics Concern  . Not on file   Social History Narrative    Past Surgical History  Procedure Laterality Date  . Abdominal hysterectomy      at ge 39. secondary to bleeding  . Toe amputation  Sept 2012    Right 2nd toe, [place]  . Ptca  August 2012    Right Posterior  tibial artery , [place]  . Colonoscopy with propofol N/A 01/09/2015    Procedure: COLONOSCOPY WITH PROPOFOL;  Surgeon: Lucilla Lame, MD;  Location: [facility] ENDOSCOPY;  Service: Endoscopy;  Laterality: N/A;  . Cholecystectomy    . Carpal tunnel release Bilateral   . Breast cyst aspiration Right   . Coronary angioplasty with stent placement      [place]; [facility]     Prescriptions prior to admission  Medication Sig Dispense Refill Last Dose  . albuterol (PROVENTIL) (2.5 MG/3ML) 0.083% nebulizer solution Take 3 mLs (2.5 mg total) by nebulization every 6 (six) hours as needed for wheezing or shortness of breath. 150 mL 1 PRN  . alendronate (FOSAMAX) 70 MG tablet Take with a full glass of water on an empty stomach every Sunday  Take with a full glass of water and take no other meds or food for   30 minutes . 12 tablet 1 04/15/2015 at am  . ALPRAZolam (XANAX) 0.5 MG tablet Take 1 tablet (0.5 mg total) by mouth daily as needed for sleep or anxiety. 90 tablet 0 PRN  . aspirin 81 MG EC tablet Take 1 tablet (81 mg total) by mouth daily. Swallow whole. 30 tablet 12 04/19/2015 at am  . benzonatate (TESSALON) 100 MG capsule Take 2 capsules (200 mg total) by mouth 3 (three) times daily as needed for cough. 90 capsule 0 PRN  . Blood Glucose Monitoring Suppl (ACCU-CHEK AVIVA) device Use as instructed 1 each 0 Taking  . Blood Glucose Monitoring Suppl (TRUERESULT BLOOD GLUCOSE) W/DEVICE KIT 1 kit by Does not apply route 3 (three) times daily. 1 each 0 Taking  . BRILINTA 90 MG TABS tablet Take 1 tablet (90 mg total) by mouth 2 (two) times daily. 180 tablet 2 04/19/2015 at am  . budesonide-formoterol (SYMBICORT) 160-4.5 MCG/ACT inhaler Inhale 2 puffs into the lungs 2 (two) times daily. 1 Inhaler 0 04/19/2015 at am  . COMBIVENT RESPIMAT 20-100 MCG/ACT AERS respimat Inhale 1 puff into the lungs 3 (three) times daily as needed. (Patient taking differently: Inhale 1 puff into the lungs 3 (three) times  daily as needed for wheezing or shortness of breath. ) 2 Inhaler 3 PRN  . fluticasone (FLONASE) 50 MCG/ACT nasal spray Place 2 sprays into both nostrils daily. 16 g 2 04/19/2015 at am  . furosemide (LASIX) 20 MG tablet Take 1 tablet (20 mg total) by mouth daily. 90 tablet 1 04/19/2015 at am  . glucose blood (ACCU-CHEK AVIVA) test strip Use up to three times a day. 100 each 11 Taking  . insulin aspart protamine - aspart (NOVOLOG 70/30 MIX) (70-30) 100 UNIT/ML FlexPen 60 units before breakfast,  60 units before dinner.  NEEDS 15 PENS PER MONTH (Patient taking differently: Inject 44-50 Units into the skin See admin instructions. 44 units before breakfast and 50 units before supper) 105 mL 2 04/19/2015 at  am  . Insulin Pen Needle (B-D UF III MINI PEN NEEDLES) 31G X 5 MM MISC Use  Three times daily 300 each 3 Taking  . isosorbide mononitrate (IMDUR) 30 MG 24 hr tablet Take 1 tablet (30 mg total) by mouth daily. 90 tablet 1 04/19/2015 at am  . Lactulose 20 GM/30ML SOLN 30 ml every 4 hours until constipation is relieved (Patient taking differently: Take 30 mLs by mouth every 4 (four) hours as needed (for constipation). ) 236 mL 3 PRN  . Lancet Devices (ACCU-CHEK SOFTCLIX) lancets Utilize lancets three times a day to check blood glucose levels. 1 each 11 Taking  . losartan (COZAAR) 100 MG tablet Take 1 tablet (100 mg total) by mouth daily. 90 tablet 2 04/19/2015 at am  . meloxicam (MOBIC) 15 MG tablet Take 1 tablet (15 mg total) by mouth daily. 90 tablet 1 04/19/2015 at am  . metoprolol tartrate (LOPRESSOR) 25 MG tablet Take 0.5 tablets (12.5 mg total) by mouth 2 (two) times daily. 180 tablet 1 04/19/2015 at 1000  . nitroGLYCERIN (NITROSTAT) 0.4 MG SL tablet Place 1 tablet (0.4 mg total) under the tongue every 5 (five) minutes as needed for chest pain. 25 tablet 3 PRN  . PARoxetine (PAXIL) 10 MG tablet Take 1 tablet (10 mg total) by mouth daily. 90 tablet 1 04/19/2015 at am  . pregabalin (LYRICA) 100 MG capsule Take 1  capsule (100 mg total) by mouth 3 (three) times daily. 270 capsule 3 04/19/2015 at am  . rosuvastatin (CRESTOR) 20 MG tablet Take 1 tablet (20 mg total) by mouth daily. (Patient taking differently: Take 20 mg by mouth at bedtime. ) 90 tablet 1 04/18/2015 at pm  . Spacer/Aero-Holding Chambers (AEROCHAMBER MV) inhaler Use as instructed 1 each 0 Taking  . traMADol (ULTRAM) 50 MG tablet Take 1 tablet (50 mg total) by mouth every 6 (six) hours as needed for moderate pain. 120 tablet 3 PRN  . TRUEPLUS LANCETS 30G MISC USE TO CHECK GLUCOSE THREE TIMES DAILY 400 each 0 Taking  . Vitamin D, Ergocalciferol, (DRISDOL) 50000 UNITS CAPS capsule Take 50,000 Units by mouth every 7 (seven) days.   Past Week at am    Physical Exam: Blood pressure 136/50, pulse 58, temperature 98.2 F (36.8 C), temperature source Oral, resp. rate 18, height 5' 6"  (1.676 m), weight 196 lb 11.2 oz (89.223 kg), SpO2 99 %.  Constitutional: She is oriented to person, place, and time. She appears well-developed and well-nourished. No distress.  HENT: No nasal discharge.  Head: Normocephalic and atraumatic.  Eyes: Pupils are equal and round. No discharge.  Neck: Normal range of motion. Neck supple. No JVD present. No thyromegaly present.  Cardiovascular: Normal rate, regular rhythm, normal heart sounds. Exam reveals no gallop and no friction rub. No murmur heard.  Pulmonary/Chest: Effort normal and breath sounds normal. No stridor. No respiratory distress. She has no wheezes. She has no rales. She exhibits no tenderness.  Abdominal: Soft. Bowel sounds are normal. She exhibits no distension. There is no tenderness. There is no rebound and no guarding.  Musculoskeletal: Normal range of motion. She exhibits no edema and no tenderness.  Neurological: She is alert and oriented to person, place, and time. Coordination normal.  Skin: Skin is warm and dry. No rash noted. She is not diaphoretic. No erythema. No pallor.  Psychiatric: She has a  normal mood and affect. Her behavior is normal. Judgment and thought content normal.       Labs:  Lab Results  Component Value Date   WBC 6.8 04/20/2015   HGB 10.2* 04/20/2015   HCT 30.4* 04/20/2015   MCV 88.3 04/20/2015   PLT 242 04/20/2015    Recent Labs Lab 04/19/15 1634  04/20/15 0628  NA 137  < > 140  K 4.3  < > 4.3  CL 100*  < > 108  CO2 28  < > 25  BUN 37*  < > 47*  CREATININE 2.04*  < > 2.16*  CALCIUM 9.8  < > 9.1  PROT 7.6  --   --   BILITOT 0.5  --   --   ALKPHOS 59  --   --   ALT 18  --   --   AST 20  --   --   GLUCOSE 196*  < > 164*  < > = values in this interval not displayed. Lab Results  Component Value Date   CKTOTAL 125 02/09/2015   CKMB CANCELED 02/09/2015   TROPONINI <0.03 04/19/2015       EKG:  Her EKG done during this admission.  ASSESSMENT AND PLAN:   1. Acute on chronic renal failure: Likely due to mild degree of volume depletion. She also has underlying urinary tract infection. I agree with holding diuretics.   2. Chronic systolic/diastolic heart failure with most recent ejection fraction of 50-55%. She appears to be currently euvolemic. Lasix can be resumed at 20 mg once daily upon discharge which is her previous dose.  3. Coronary artery disease: The patient has no anginal symptoms.   Signed: Kathlyn Sacramento MD, [facility] 04/20/2015, 5:24 PM

## 2015-04-20 NOTE — Progress Notes (Signed)
Nurse call me as patient was complaining of left-sided weakness and numbness. Nurse's concern patient could possibly of stroke. I examined the patient patient does have some weakness on her left leg which was there this morning. However patient is complaining of severe knee pain. I will order CT of the head to evaluate for stroke and left knee x-ray to evaluate for arthritis..  VSS CVS rrr no m,g/r NEURO cranial nerves II through XII are intact. Left lower extremity 4-5 upper extremity 4-5 right upper and lower 5 out of 5. Patient significant pain in her knee when moving her left leg. She has no edema Lungs clear to auscultation no wheezing  Extremities no edema  Time spent 28 minutes

## 2015-04-20 NOTE — Progress Notes (Signed)
Pt reported left sided numbness.  Assessed pt noted pt was weaker on left side than right in the upper and lower extremity. Pt also barely felt sensation on the left side. Pt speech was slow and stammered already no change from baseline in speech noted. Notified Dr. Benjie Karvonen about pt status and vitals. New orders received. Pt resting in bed. Continue to assess.

## 2015-04-21 ENCOUNTER — Encounter
Admission: RE | Admit: 2015-04-21 | Discharge: 2015-04-21 | Disposition: A | Discharge status: Home / Self Care | Payer: Medicare Other | Source: Ambulatory Visit | Attending: Internal Medicine | Admitting: Internal Medicine

## 2015-04-21 LAB — BASIC METABOLIC PANEL
Anion gap: 5 (ref 5–15)
BUN: 37 mg/dL — ABNORMAL HIGH (ref 6–20)
CHLORIDE: 108 mmol/L (ref 101–111)
CO2: 27 mmol/L (ref 22–32)
CREATININE: 1.56 mg/dL — AB (ref 0.44–1.00)
Calcium: 9.5 mg/dL (ref 8.9–10.3)
GFR calc non Af Amer: 32 mL/min — ABNORMAL LOW (ref >60)
GFR, EST AFRICAN AMERICAN: 37 mL/min — AB (ref >60)
Glucose, Bld: 163 mg/dL — ABNORMAL HIGH (ref 65–99)
Potassium: 4.7 mmol/L (ref 3.5–5.1)
Sodium: 140 mmol/L (ref 135–145)

## 2015-04-21 LAB — GLUCOSE, CAPILLARY
GLUCOSE-CAPILLARY: 298 mg/dL — AB (ref 65–99)
Glucose-Capillary: 181 mg/dL — ABNORMAL HIGH (ref 65–99)
Glucose-Capillary: 147 mg/dL — ABNORMAL HIGH (ref 65–99)
Glucose-Capillary: 160 mg/dL — ABNORMAL HIGH (ref 65–99)

## 2015-04-21 LAB — URINE CULTURE
Culture: >=100000
Special Requests: NORMAL

## 2015-04-21 MED ORDER — CEFUROXIME AXETIL 250 MG PO TABS: 250.0000 mg | ORAL_TABLET | Freq: Two times a day (BID) | ORAL | Status: DC

## 2015-04-21 MED ORDER — ERTAPENEM SODIUM 1 G IJ SOLR: 1.0000 g | INTRAMUSCULAR | Status: AC

## 2015-04-21 MED ORDER — LIDOCAINE HCL (PF) 1 % IJ SOLN
3.2000 mL | INTRAMUSCULAR | Status: DC
  Administered 2015-04-22: 3.2 mL
  Filled 2015-04-21 (×2): qty 5

## 2015-04-21 MED ORDER — ERTAPENEM SODIUM 1 G IJ SOLR
1.0000 g | INTRAMUSCULAR | Status: DC
  Administered 2015-04-21 – 2015-04-22 (×2): 1 g via INTRAMUSCULAR
  Filled 2015-04-21 (×3): qty 1

## 2015-04-21 MED ORDER — LIDOCAINE HCL (PF) 1 % IJ SOLN
3.2000 mL | Freq: Once | INTRAMUSCULAR | Status: AC
  Administered 2015-04-21: 3.2 mL
  Filled 2015-04-21: qty 5

## 2015-04-21 NOTE — Discharge Summary (Signed)
[place] [facility] [place] [name]    MR#:  [id number]  DATE OF BIRTH:  01-24-1941  DATE OF ADMISSION:  04/19/2015 ADMITTING PHYSICIAN: Lance Coon, MD  DATE OF DISCHARGE: 04/21/2015  PRIMARY CARE PHYSICIAN: [name], MD    ADMISSION DIAGNOSIS:  Generalized weakness [R53.1] Acute kidney injury superimposed on chronic kidney disease ([place]) [N17.9, N18.9]  DISCHARGE DIAGNOSIS:  Principal Problem:   Acute on chronic kidney failure (HCC) Active Problems:   DM (diabetes mellitus), type 2, uncontrolled, with renal complications (HCC)   Depression with anxiety   Hyperlipidemia LDL goal <100   Essential hypertension   ESBL UTI (urinary tract infection)   Chronic diastolic CHF (congestive heart failure) ([place])   Fall   SECONDARY DIAGNOSIS:   Past Medical History  Diagnosis Date  . Diabetes mellitus   . 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 Cardiomyopathy, ischemic Jan 2013    EF 35 to 40% [facility])  . Hepatic steatosis     by CT abd pelvis  . Osteoporosis, post-menopausal   . 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 Atherosclerotic peripheral vascular disease with gangrene [facility]) august 2012  . Hypertension   . Hyperlipidemia   . Sarcoidosis ([place])   . CHF (congestive heart failure) ([place])   . Renal insufficiency   . 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 CCF (congestive cardiac failure) ([place]) 08/17/2013  . Pleural effusion 10/25/2012    10/2012 CT chest >> small to moderate R lung effusion>> chylothorax, 100% lymphs 10/2013 thoracentesis> cytology negative, WBC 1471, > 90% "small lymphs" 01/2014 CT chest> near complete resolution of pleural effusion, stable lymphadenopathy 08/2014 CT chest [place] ([facility]): small right sided effusion decreased in  size, stable mediastinal lymphadenopathy 1.0cm largest, 1m . 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 Acute respiratory failure ([place] 11/21/2014  . Diabetic diarrhea ([place] 10/03/2014  . DM type 2, uncontrolled, with renal complications ([place] 82/12/7679 . Cerebral infarct ([place] 08/17/2013  . Arthritis 08/05/2013  . 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 Hyperlipidemia LDL goal <100 02/23/2014  . COPD (chronic obstructive pulmonary disease) ([facility]     HOSPITAL COURSE:   74year old female with a history of ischemic cardiomyopathy, diabetes and CAD who presented after fall and was found to have acute renal failure.   1. Acute on chronic renal failure stage III: Patient's baseline creatinine is 1.4-1.5. Creatinine has imroved with IV fluids. HEr AKI was due to prerenal azotemia secondary to dehydration and orthostasis from her diuretic. As per Cardiology consult, patient can resume her outpatient dose of LASIX and she is also on ARB.  2. Urinary tract infection: ESBL UTI as per urine culture sensitive to MACROBID and SULFA and INIPENEM> She will be discharged on IM ERTAPENEM.   3. Diabetes type II: Continue sliding scale insulin, ADA diet and Lantus.  4. Ischemic cardiomyopathy: Continue Imdur, metoprolol, BRILANTAand aspirin.   DISCHARGE CONDITIONS AND DIET:   Diabetic diet Stable condition   CONSULTS OBTAINED:  Treatment Team:  Wellington Hampshire, MD  DRUG ALLERGIES:   Allergies  Allergen Reactions  . Contrast Media [Iodinated Diagnostic Agents] Itching  . Gabapentin Itching  . Sulfa Antibiotics Itching    DISCHARGE MEDICATIONS:   Current Discharge Medication List    START taking these medications   Details  ERTAPENEM 1 gram q24h until 12/21        CONTINUE these medications which have NOT CHANGED   Details  albuterol (PROVENTIL) (2.5 MG/3ML) 0.083% nebulizer solution Take 3 mLs (2.5 mg total) by nebulization every 6 (six) hours as needed for wheezing or shortness of breath. Qty: 150 mL, Refills: 1    alendronate (FOSAMAX) 70 MG tablet Take with a full glass of water on an empty stomach every Sunday  Take with a full glass of water and take no other meds or food for   30 minutes . Qty: 12 tablet, Refills: 1    ALPRAZolam (XANAX) 0.5 MG tablet Take 1 tablet (0.5 mg total) by mouth daily as needed for sleep or anxiety. Qty: 90 tablet, Refills: 0    aspirin 81 MG EC tablet Take 1 tablet (81 mg total) by mouth daily. Swallow whole. Qty: 30 tablet, Refills: 12    benzonatate (TESSALON) 100 MG capsule Take 2 capsules (200 mg total) by mouth 3 (three) times daily as needed for cough. Qty: 90 capsule, Refills: 0    Blood Glucose Monitoring Suppl (ACCU-CHEK AVIVA) device Use as instructed Qty: 1 each, Refills: 0    Blood Glucose Monitoring Suppl (TRUERESULT BLOOD GLUCOSE) W/DEVICE KIT 1 kit by Does not apply route 3 (three) times daily. Qty: 1 each, Refills: 0    BRILINTA 90 MG TABS tablet Take 1 tablet (90 mg total) by mouth 2 (two) times daily. Qty: 180 tablet, Refills: 2   Associated Diagnoses: Cardiomyopathy, ischemic    budesonide-formoterol (SYMBICORT) 160-4.5 MCG/ACT inhaler Inhale 2 puffs into the lungs 2 (two) times daily. Qty: 1 Inhaler, Refills: 0    COMBIVENT RESPIMAT 20-100 MCG/ACT AERS respimat Inhale 1 puff into the lungs 3 (three) times daily as needed. Qty: 2 Inhaler, Refills: 3    fluticasone (FLONASE) 50 MCG/ACT nasal spray Place 2 sprays into both nostrils daily. Qty: 16 g, Refills: 2    furosemide (LASIX) 20 MG tablet Take 1 tablet (20 mg total) by mouth daily. Qty: 90 tablet, Refills: 1   Associated Diagnoses: Cardiomyopathy, ischemic    glucose blood (ACCU-CHEK AVIVA) test strip Use up to three times a  day. Qty: 100 each, Refills: 11    insulin aspart protamine - aspart (NOVOLOG 70/30 MIX) (70-30) 100 UNIT/ML FlexPen 60 units before breakfast,  60 units before dinner.  NEEDS 15 PENS PER MONTH Qty: 105 mL, Refills: 2    Insulin Pen Needle (B-D UF III MINI PEN NEEDLES) 31G X 5 MM MISC Use  Three times daily Qty: 300 each, Refills: 3    isosorbide mononitrate (IMDUR) 30 MG 24 hr tablet Take 1 tablet (30 mg total) by mouth daily. Qty: 90 tablet, Refills: 1   Associated Diagnoses: Cardiomyopathy, ischemic    Lactulose 20 GM/30ML SOLN 30 ml every 4 hours until constipation is relieved Qty: 236 mL, Refills: 3    Lancet Devices (ACCU-CHEK SOFTCLIX) lancets Utilize lancets three times a day to check blood glucose levels. Qty: 1 each, Refills: 11    losartan (COZAAR) 100 MG tablet Take 1 tablet (100 mg total) by mouth daily. Qty: 90 tablet, Refills: 2    metoprolol  tartrate (LOPRESSOR) 25 MG tablet Take 0.5 tablets (12.5 mg total) by mouth 2 (two) times daily. Qty: 180 tablet, Refills: 1   Associated Diagnoses: Cardiomyopathy, ischemic    nitroGLYCERIN (NITROSTAT) 0.4 MG SL tablet Place 1 tablet (0.4 mg total) under the tongue every 5 (five) minutes as needed for chest pain. Qty: 25 tablet, Refills: 3    PARoxetine (PAXIL) 10 MG tablet Take 1 tablet (10 mg total) by mouth daily. Qty: 90 tablet, Refills: 1    pregabalin (LYRICA) 100 MG capsule Take 1 capsule (100 mg total) by mouth 3 (three) times daily. Qty: 270 capsule, Refills: 3    rosuvastatin (CRESTOR) 20 MG tablet Take 1 tablet (20 mg total) by mouth daily. Qty: 90 tablet, Refills: 1    Spacer/Aero-Holding Chambers (AEROCHAMBER MV) inhaler Use as instructed Qty: 1 each, Refills: 0    traMADol (ULTRAM) 50 MG tablet Take 1 tablet (50 mg total) by mouth every 6 (six) hours as needed for moderate pain. Qty: 120 tablet, Refills: 3    TRUEPLUS LANCETS 30G MISC USE TO CHECK GLUCOSE THREE TIMES DAILY Qty: 400 each, Refills: 0     Vitamin D, Ergocalciferol, (DRISDOL) 50000 UNITS CAPS capsule Take 50,000 Units by mouth every 7 (seven) days.      STOP taking these medications     meloxicam (MOBIC) 15 MG tablet               Today   CHIEF COMPLAINT:   Doing better this am, still weak on left leg but better range of motion   VITAL SIGNS:  Blood pressure 171/57, pulse 58, temperature 98.2 F (36.8 C), temperature source Oral, resp. rate 18, height 5' 6" (1.676 m), weight 89.268 kg (196 lb 12.8 oz), SpO2 100 %.   REVIEW OF SYSTEMS:  Review of Systems  Constitutional: Negative for fever, chills and malaise/fatigue.  HENT: Negative for sore throat.   Eyes: Negative for blurred vision.  Respiratory: Negative for cough, hemoptysis, shortness of breath and wheezing.   Cardiovascular: Negative for chest pain, palpitations and leg swelling.  Gastrointestinal: Negative for nausea, vomiting, abdominal pain, diarrhea and blood in stool.  Genitourinary: Negative for dysuria.  Musculoskeletal: Negative for back pain (better). Joint pain: better knee and better ROM.  Neurological: Negative for dizziness, tremors and headaches.  Endo/Heme/Allergies: Does not bruise/bleed easily.     PHYSICAL EXAMINATION:  GENERAL:  74 y.o.-year-old patient lying in the bed with no acute distress.  NECK:  Supple, no jugular venous distention. No thyroid enlargement, no tenderness.  LUNGS: Normal breath sounds bilaterally, no wheezing, rales,rhonchi  No use of accessory muscles of respiration.  CARDIOVASCULAR: S1, S2 normal. No murmurs, rubs, or gallops.  ABDOMEN: Soft, non-tender, non-distended. Bowel sounds present. No organomegaly or mass.  EXTREMITIES: No pedal edema, cyanosis, or clubbing. Left LE 4+/5 PSYCHIATRIC: The patient is alert and oriented x 3.  SKIN: small rash on her right wrist  DATA REVIEW:   CBC  Recent Labs Lab 04/20/15 0628  WBC 6.8  HGB 10.2*  HCT 30.4*  PLT 242    Chemistries   Recent  Labs Lab 04/19/15 1634  04/20/15 0628  NA 137  < > 140  K 4.3  < > 4.3  CL 100*  < > 108  CO2 28  < > 25  GLUCOSE 196*  < > 164*  BUN 37*  < > 47*  CREATININE 2.04*  < > 2.16*  CALCIUM 9.8  < > 9.1  MG 2.3  --   --  AST 20  --   --   ALT 18  --   --   ALKPHOS 59  --   --   BILITOT 0.5  --   --   < > = values in this interval not displayed.  Cardiac Enzymes  Recent Labs Lab 04/19/15 1634 04/19/15 2040  TROPONINI 0.03 <0.03    Microbiology Results  _0 @  RADIOLOGY:  Dg Lumbar Spine 2-3 Views  04/19/2015  CLINICAL DATA:  Recent dizzy spell with fall, low back pain, initial encounter EXAM: LUMBAR SPINE - 2-3 VIEW COMPARISON:  None. FINDINGS: Five lumbar type vertebral bodies are well visualized. Vertebral body height is well maintained. Mild osteophytic changes are seen. No spondylolisthesis is noted. No soft tissue abnormality is seen. IMPRESSION: Mild degenerative change without acute abnormality. Electronically Signed   By: Inez Catalina M.D.   On: 04/19/2015 17:10   Ct Head Wo Contrast  04/20/2015  CLINICAL DATA:  Left-sided numbness and tingling EXAM: CT HEAD WITHOUT CONTRAST TECHNIQUE: Contiguous axial images were obtained from the base of the skull through the vertex without intravenous contrast. COMPARISON:  November 14, 2013 FINDINGS: The ventricles are normal in size and configuration. There is no intracranial mass, hemorrhage, extra-axial fluid collection, or midline shift. Gray-white compartments appear normal. No acute infarct evident. Basal ganglia calcification is again noted, a finding most likely physiologic in this age group. Bony calvarium appears intact. Visualized mastoid air cells are clear. No intraorbital lesions are identified. IMPRESSION: No intracranial mass, hemorrhage, or evidence of acute infarct. Basal ganglia calcification is stable, a finding that is most likely physiologic in this age group. Electronically Signed   By: Lowella Grip III M.D.    On: 04/20/2015 13:02   Dg Knee Left Port  04/20/2015  CLINICAL DATA:  Progressive left knee pain.  Fall yesterday. EXAM: PORTABLE LEFT KNEE - 1-2 VIEW COMPARISON:  None. FINDINGS: Left knee is located. No acute bone or soft tissue abnormality is present. Joint spaces are preserved. No significant osteophytes are present. There is no fusion. Extensive vascular calcifications are present. IMPRESSION: 1. No acute abnormality. 2. Atherosclerosis. Electronically Signed   By: San Morelle M.D.   On: 04/20/2015 12:50   Dg Hip Unilat With Pelvis 2-3 Views Right  04/19/2015  CLINICAL DATA:  Recent dizziness with and right hip pain, initial encounter EXAM: DG HIP (WITH OR WITHOUT PELVIS) 2-3V RIGHT COMPARISON:  None. FINDINGS: The pelvic ring is intact. Degenerative changes of the hip joints are noted bilaterally. No acute fracture or dislocation is seen. No gross soft tissue abnormality is noted. IMPRESSION: No acute abnormality seen. Electronically Signed   By: Inez Catalina M.D.   On: 04/19/2015 17:05      Management plans discussed with the patient and she is in agreement. Stable for discharge SNF  Patient should follow up with PCP in 1 week and CARDIOLOGY in 1 week  CODE STATUS:     Code Status Orders        Start     Ordered   04/20/15 0118  Full code   Continuous     04/20/15 0117    Advance Directive Documentation        Most Recent Value   Type of Advance Directive  Healthcare Power of [place], Living will   Pre-existing out of facility DNR order (yellow form or pink MOST form)     "MOST" Form in Place?        TOTAL TIME TAKING CARE OF  THIS PATIENT: 35 minutes.    Note: This dictation was prepared with Dragon dictation along with smaller phrase technology. Any transcriptional errors that result from this process are unintentional.  Kindrick Lankford M.D on 04/21/2015 at 7:06 AM  Between 7am to 6pm - Pager - [phone] After 6pm go to [email or web address] - password [email or web address]  [place] [facility]  Office  [phone]  CC: Primary care physician; [name], MD

## 2015-04-21 NOTE — Progress Notes (Addendum)
Patient cannot be d/c today as per CSW Patient with ESBL UTI. Bactrim ordered adn will repeat BMP in am. OOB today with assist

## 2015-04-21 NOTE — Clinical Social Work Note (Signed)
Clinical Social Work Assessment  Patient Details  [name] MRN: [id number] Date of Birth: 04-05-1941  Date of referral:  04/21/15               Reason for consult:  Facility Placement                Permission sought to share information with:  Facility Sport and exercise psychologist, Family Supports Permission granted to share information::  Yes, Verbal Permission Granted  Name::      (husband [place])  Agency::     Relationship::     Contact Information:     Housing/Transportation Living arrangements for the past 2 months:  Single Family Home Source of Information:  Patient, Medical Team Patient Interpreter Needed:  None Criminal Activity/Legal Involvement Pertinent to Current Situation/Hospitalization:  No - Comment as needed Significant Relationships:  Adult Children, Spouse Lives with:  Spouse Do you feel safe going back to the place where you live?  Yes Need for family participation in patient care:  No (Coment)  Care giving concerns:  None at this time   Facilities manager / plan:  Holiday representative (CSW) consult for SNF placement, PT is recommending STR to assist with getting patient back to previous level of functioning.   Patient was alert, oriented and engaged in conversation with CSW.  CSW introduced self and explained role of CSW department.    Patient currently lives with her husband of 44yrs.  She is orig from [place] moved to [place] about 9 years ago.  Patient has children that all live in [place]. [place] explained that PT is recommending rehab.  Patient not been to SNF. CSW reviewed SNF process with patient, Patient is agreeable to SNF search and prefers [facility] [facility].  CSW explained that in order for Medicare  to pay for rehab patient will need a 3 night qualifying inpatient stay. Patient will meet the 3 night stay requirement after 04/21/15.   CSW will complete FL2, and fax out for available bed in anticipation of discharging to SNF for rehab.   Employment status:   Disabled (Comment on whether or not currently receiving Disability) Insurance information:  Medicare PT Recommendations:  [place] / Referral to community resources:  [place]  Patient/Family's Response to care:  Patient was appreciative of information provided by CSW and will review SNF list for other facilities in the event [facility] is unable to make an offer.  Patient/Family's Understanding of and Emotional Response to Diagnosis, Current Treatment, and Prognosis:  Patient understands that she is under continued medical work up at this time.  Once medically stable she will discharge to SNF.  Emotional Assessment Appearance:  Appears stated age Attitude/Demeanor/Rapport:    Affect (typically observed):  Accepting, Adaptable, Pleasant, Calm Orientation:  Oriented to Self, Oriented to Place, Oriented to  Time, Oriented to Situation Alcohol / Substance use:  Never Used Psych involvement (Current and /or in the community):  No (Comment)  Discharge Needs  Concerns to be addressed:  Care Coordination, Discharge Planning Concerns Readmission within the last 30 days:  No Current discharge risk:  Chronically ill, Dependent with Mobility, Lack of support system Barriers to Discharge:  Awaiting State Approval ([place])   Maurine Cane, LCSW 04/21/2015, 9:31 AM

## 2015-04-21 NOTE — Progress Notes (Signed)
Patient is alert and oriented.  Vital signs stable.  Complaint of pain in the left leg which was relieved with Tylenol.  Tolerating her diet well.  Voiding adequately.  Patient did have a BM today.  Patient has walked around in the room and sat up in the chair most of the afternoon.  Plan for discharge to a rehab facility when a bed is available.

## 2015-04-21 NOTE — NC FL2 (Signed)
[place] LEVEL OF CARE SCREENING TOOL     IDENTIFICATION  Patient [name] Birthdate: 1941-01-20 Sex: female Admission Date (Current Location): 04/19/2015  [place] and [place] Number:  Set designer)   Facility and Address:  [facility], [address], [place], [place] [address]      Provider Number: [id number]  Attending Physician Name and Address:  Bettey Costa, MD  Relative Name and Phone Number:       Current Level of Care: Hospital Recommended Level of Care: [place] Prior Approval Number:    Date Approved/Denied:   PASRR Number:    Discharge Plan: SNF    Current Diagnoses: Patient Active Problem List   Diagnosis Date Noted  . Fall 04/19/2015  . Acute on chronic kidney failure ([place]) 04/19/2015  . Chronic diastolic CHF (congestive heart failure) ([place]) 04/18/2015  . Diabetic neuropathy ([place]) 02/21/2015  . Benign neoplasm of sigmoid colon   . UTI (urinary tract infection) 10/04/2014  . Diabetic diarrhea ([place]) 10/03/2014  . Low back pain with radiation 04/04/2014  . Abdominal pain, suprapubic 04/04/2014  . Essential hypertension 04/04/2014  . Hyperlipidemia LDL goal <100 02/23/2014  . Long-term use of high-risk medication 02/23/2014  . OP (osteoporosis) 08/17/2013  . Dermatophytic onychia 08/17/2013  . Angina pectoris ([place]) 08/17/2013  . Arteriosclerosis of coronary artery 08/17/2013  . Cerebral infarct ([employer]) 08/17/2013  . Bony exostosis 08/17/2013  . Adiposity 08/17/2013  . Arthritis 08/05/2013  . Unsteady gait 03/24/2013  . History of colonic polyps 02/20/2013  . 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 Dysphagia 12/07/2012  . Pleural effusion 10/25/2012  . Cough 10/25/2012  . Hospital discharge follow-up 09/17/2012  . Pulmonary nodule seen on imaging study 05/20/2012  . 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 Cardiomyopathy,  ischemic   . Hepatic steatosis   . Osteoporosis, post-menopausal   . 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 DM (diabetes mellitus), type 2, uncontrolled, with renal complications ([place]) [id number]  . Double vessel coronary artery disease 12/14/2011    Orientation RESPIRATION BLADDER Height & Weight    Self, Time, Situation, Place  Normal Continent 5\' 6"  (167.6 cm) 184 lbs.  BEHAVIORAL SYMPTOMS/MOOD NEUROLOGICAL BOWEL NUTRITION STATUS      Continent Diet (Diabetic)  AMBULATORY STATUS COMMUNICATION OF NEEDS Skin   Limited Assist Verbally Normal                       Personal Care Assistance Level of Assistance  Bathing, Feeding, Dressing Bathing Assistance: Limited assistance Feeding assistance: Independent Dressing Assistance: Limited assistance     Functional Limitations Info  Sight, Hearing, Speech Sight Info: Adequate Hearing Info: Adequate Speech Info: Adequate    SPECIAL CARE FACTORS FREQUENCY  PT (By licensed PT)                    Contractures      Additional Factors Info  Allergies, Psychotropic   Allergies Info:  (Contrast Media, Gabapentin, Sulfa Antibiotics)           Current Medications (04/21/2015):  This is the current hospital active medication list Current Facility-Administered Medications  Medication Dose Route Frequency Provider Last Rate Last Dose  . acetaminophen (TYLENOL) tablet 650 mg  650 mg Oral Q6H PRN Lance Coon, MD   650 mg at 04/21/15 0749   Or  . acetaminophen (TYLENOL) suppository 650 mg  650 mg Rectal Q6H PRN Lance Coon, MD      . albuterol (PROVENTIL) (2.5 MG/3ML) 0.083% nebulizer solution 2.5 mg  2.5 mg Nebulization Q6H PRN Lance Coon, MD      . aspirin EC tablet 81 mg  81 mg Oral Daily Lance Coon, MD   81 mg at 04/20/15 0850  . budesonide-formoterol (SYMBICORT) 160-4.5 MCG/ACT inhaler 2 puff  2 puff Inhalation BID Lance Coon, MD   2 puff at 04/20/15 2127  . ertapenem  [facility]) injection 1 g  1 g Intramuscular Q24H Sital Mody, MD      . heparin injection 5,000 Units  5,000 Units Subcutaneous 3 times per day Lance Coon, MD   5,000 Units at 04/21/15 0615  . insulin aspart (novoLOG) injection 0-15 Units  0-15 Units Subcutaneous TID WC Lance Coon, MD   3 Units at 04/21/15 [phone]  . insulin aspart (novoLOG) injection 0-5 Units  0-5 Units Subcutaneous QHS Lance Coon, MD   3 Units at 04/20/15 0201  . insulin glargine (LANTUS) injection 20 Units  20 Units Subcutaneous QHS Lance Coon, MD   20 Units at 04/20/15 2127  . ipratropium-albuterol (DUONEB) 0.5-2.5 (3) MG/3ML nebulizer solution 3 mL  3 mL Inhalation TID PRN Lance Coon, MD      . isosorbide mononitrate (IMDUR) 24 hr tablet 30 mg  30 mg Oral Daily Lance Coon, MD   30 mg at 04/20/15 0848  . metoprolol tartrate (LOPRESSOR) tablet 12.5 mg  12.5 mg Oral BID Lance Coon, MD   12.5 mg at 04/20/15 2126  . ondansetron (ZOFRAN) tablet 4 mg  4 mg Oral Q6H PRN Lance Coon, MD       Or  . ondansetron [facility]) injection 4 mg  4 mg Intravenous Q6H PRN Lance Coon, MD      . PARoxetine (PAXIL) tablet 10 mg  10 mg Oral Daily Lance Coon, MD   10 mg at 04/20/15 0849  . pregabalin (LYRICA) capsule 100 mg  100 mg Oral TID Lance Coon, MD   100 mg at 04/20/15 2126  . rosuvastatin (CRESTOR) tablet 20 mg  20 mg Oral QHS Lance Coon, MD   20 mg at 04/20/15 2127  . sodium chloride 0.9 % injection 3 mL  3 mL Intravenous Q12H Lance Coon, MD   3 mL at 04/20/15 2127  . ticagrelor (BRILINTA) tablet 90 mg  90 mg Oral BID Lance Coon, MD   90 mg at 04/20/15 2127   Facility-Administered Medications Ordered in Other Encounters  Medication Dose Route Frequency Provider Last Rate Last Dose  . albuterol (PROVENTIL) (5 MG/ML) 0.5% nebulizer solution 2.5 mg  2.5 mg Nebulization Once [name], MD         Discharge Medications: Please see discharge summary for a list of discharge medications.  Relevant Imaging  Results:  Relevant Lab Results:   Additional Information  [id number])  Maurine Cane, LCSW

## 2015-04-21 NOTE — Plan of Care (Addendum)
   Problem: Activity: Goal: Risk for activity intolerance will decrease Outcome: Not Progressing Generalized weakness-using bedpan

## 2015-04-22 DIAGNOSIS — Z9181 History of falling: Secondary | ICD-10-CM | POA: Diagnosis not present

## 2015-04-22 DIAGNOSIS — N179 Acute kidney failure, unspecified: Secondary | ICD-10-CM | POA: Diagnosis not present

## 2015-04-22 DIAGNOSIS — M810 Age-related osteoporosis without current pathological fracture: Secondary | ICD-10-CM | POA: Diagnosis not present

## 2015-04-22 DIAGNOSIS — E1321 Other specified diabetes mellitus with diabetic nephropathy: Secondary | ICD-10-CM | POA: Diagnosis not present

## 2015-04-22 DIAGNOSIS — R296 Repeated falls: Secondary | ICD-10-CM | POA: Diagnosis not present

## 2015-04-22 DIAGNOSIS — R262 Difficulty in walking, not elsewhere classified: Secondary | ICD-10-CM | POA: Diagnosis not present

## 2015-04-22 DIAGNOSIS — E1122 Type 2 diabetes mellitus with diabetic chronic kidney disease: Secondary | ICD-10-CM | POA: Diagnosis not present

## 2015-04-22 DIAGNOSIS — I10 Essential (primary) hypertension: Secondary | ICD-10-CM | POA: Diagnosis not present

## 2015-04-22 DIAGNOSIS — M6281 Muscle weakness (generalized): Secondary | ICD-10-CM | POA: Diagnosis not present

## 2015-04-22 DIAGNOSIS — F329 Major depressive disorder, single episode, unspecified: Secondary | ICD-10-CM | POA: Diagnosis not present

## 2015-04-22 DIAGNOSIS — R195 Other fecal abnormalities: Secondary | ICD-10-CM | POA: Diagnosis not present

## 2015-04-22 DIAGNOSIS — E1165 Type 2 diabetes mellitus with hyperglycemia: Secondary | ICD-10-CM | POA: Diagnosis not present

## 2015-04-22 DIAGNOSIS — R531 Weakness: Secondary | ICD-10-CM | POA: Diagnosis not present

## 2015-04-22 DIAGNOSIS — R278 Other lack of coordination: Secondary | ICD-10-CM | POA: Diagnosis not present

## 2015-04-22 DIAGNOSIS — N390 Urinary tract infection, site not specified: Secondary | ICD-10-CM | POA: Diagnosis not present

## 2015-04-22 DIAGNOSIS — E1121 Type 2 diabetes mellitus with diabetic nephropathy: Secondary | ICD-10-CM | POA: Diagnosis not present

## 2015-04-22 DIAGNOSIS — N183 Chronic kidney disease, stage 3 (moderate): Secondary | ICD-10-CM | POA: Diagnosis not present

## 2015-04-22 DIAGNOSIS — Z1612 Extended spectrum beta lactamase (ESBL) resistance: Secondary | ICD-10-CM | POA: Diagnosis not present

## 2015-04-22 DIAGNOSIS — E119 Type 2 diabetes mellitus without complications: Secondary | ICD-10-CM | POA: Diagnosis not present

## 2015-04-22 DIAGNOSIS — J449 Chronic obstructive pulmonary disease, unspecified: Secondary | ICD-10-CM | POA: Diagnosis not present

## 2015-04-22 DIAGNOSIS — M545 Low back pain: Secondary | ICD-10-CM | POA: Diagnosis not present

## 2015-04-22 DIAGNOSIS — I5032 Chronic diastolic (congestive) heart failure: Secondary | ICD-10-CM | POA: Diagnosis not present

## 2015-04-22 DIAGNOSIS — E1342 Other specified diabetes mellitus with diabetic polyneuropathy: Secondary | ICD-10-CM | POA: Diagnosis not present

## 2015-04-22 DIAGNOSIS — N189 Chronic kidney disease, unspecified: Secondary | ICD-10-CM | POA: Diagnosis not present

## 2015-04-22 DIAGNOSIS — Z794 Long term (current) use of insulin: Secondary | ICD-10-CM | POA: Diagnosis not present

## 2015-04-22 DIAGNOSIS — A499 Bacterial infection, unspecified: Secondary | ICD-10-CM | POA: Diagnosis not present

## 2015-04-22 DIAGNOSIS — L97522 Non-pressure chronic ulcer of other part of left foot with fat layer exposed: Secondary | ICD-10-CM | POA: Diagnosis not present

## 2015-04-22 DIAGNOSIS — M1990 Unspecified osteoarthritis, unspecified site: Secondary | ICD-10-CM | POA: Diagnosis not present

## 2015-04-22 DIAGNOSIS — I255 Ischemic cardiomyopathy: Secondary | ICD-10-CM | POA: Diagnosis not present

## 2015-04-22 DIAGNOSIS — M910 Juvenile osteochondrosis of pelvis: Secondary | ICD-10-CM | POA: Diagnosis not present

## 2015-04-22 LAB — BASIC METABOLIC PANEL
ANION GAP: 5 (ref 5–15)
BUN: 31 mg/dL — ABNORMAL HIGH (ref 6–20)
CALCIUM: 9.7 mg/dL (ref 8.9–10.3)
CO2: 26 mmol/L (ref 22–32)
CREATININE: 1.33 mg/dL — AB (ref 0.44–1.00)
Chloride: 107 mmol/L (ref 101–111)
GFR, EST AFRICAN AMERICAN: 44 mL/min — AB (ref >60)
GFR, EST NON AFRICAN AMERICAN: 38 mL/min — AB (ref >60)
GLUCOSE: 150 mg/dL — AB (ref 65–99)
Potassium: 4.8 mmol/L (ref 3.5–5.1)
Sodium: 138 mmol/L (ref 135–145)

## 2015-04-22 LAB — CBC
HCT: 32.2 % — ABNORMAL LOW (ref 35.0–47.0)
Hemoglobin: 11 g/dL — ABNORMAL LOW (ref 12.0–16.0)
MCH: 30 pg (ref 26.0–34.0)
MCHC: 34 g/dL (ref 32.0–36.0)
MCV: 88.2 fL (ref 80.0–100.0)
PLATELETS: 252 10*3/uL (ref 150–440)
RBC: 3.65 MIL/uL — AB (ref 3.80–5.20)
RDW: 15 % — ABNORMAL HIGH (ref 11.5–14.5)
WBC: 6.1 10*3/uL (ref 3.6–11.0)

## 2015-04-22 LAB — GLUCOSE, CAPILLARY
GLUCOSE-CAPILLARY: 163 mg/dL — AB (ref 65–99)
Glucose-Capillary: 133 mg/dL — ABNORMAL HIGH (ref 65–99)

## 2015-04-22 NOTE — Progress Notes (Signed)
Discharge orders received. Report called nursing Scientist, water quality at [facility].  Family at bedside and aware of intent to transfer to rehab.  No acute distress noted at this time. EMS transport arranged.

## 2015-04-22 NOTE — Clinical Social Work Note (Signed)
MD discharging patient to [facility] today. Pasrr received. Discharge information sent to [facility]. Kim at [facility] is aware. CSW informed Maudie Mercury of ESBL in urine and 10 days of IM ertapenem needed post discharge. Patient is aware of discharge and wishes to transport via EMS. Patient alert and oriented X4. Patient stated she will notify her husband. Shela Leff MSW,LCSW [phone]

## 2015-04-22 NOTE — Discharge Summary (Signed)
[place] [facility] [place] [name]    MR#:  [id number]  DATE OF BIRTH:  October 11, 1940  DATE OF ADMISSION:  04/19/2015 ADMITTING PHYSICIAN: Lance Coon, MD  DATE OF DISCHARGE 04/22/2015  PRIMARY CARE PHYSICIAN: [name], MD    ADMISSION DIAGNOSIS:  Generalized weakness [R53.1] Acute kidney injury superimposed on chronic kidney disease ([place]) [N17.9, N18.9]  DISCHARGE DIAGNOSIS:  Principal Problem:   Acute on chronic kidney failure (HCC) Active Problems:   DM (diabetes mellitus), type 2, uncontrolled, with renal complications (HCC)   Depression with anxiety   Hyperlipidemia LDL goal <100   Essential hypertension   ESBL UTI (urinary tract infection)   Chronic diastolic CHF (congestive heart failure) ([place])   Fall   SECONDARY DIAGNOSIS:   Past Medical History  Diagnosis Date  . Diabetes mellitus   . 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 Cardiomyopathy, ischemic Jan 2013    EF 35 to 40% [facility])  . Hepatic steatosis     by CT abd pelvis  . Osteoporosis, post-menopausal   . 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 Atherosclerotic peripheral vascular disease with gangrene [facility]) august 2012  . Hypertension   . Hyperlipidemia   . Sarcoidosis ([place])   . CHF (congestive heart failure) ([place])   . Renal insufficiency   . 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 CCF (congestive cardiac failure) ([place]) 08/17/2013  . Pleural effusion 10/25/2012    10/2012 CT chest >> small to moderate R lung effusion>> chylothorax, 100% lymphs 10/2013 thoracentesis> cytology negative, WBC 1471, > 90% "small lymphs" 01/2014 CT chest> near complete resolution of pleural effusion, stable lymphadenopathy 08/2014 CT chest [place] ([facility]): small right sided effusion decreased in  size, stable mediastinal lymphadenopathy 1.0cm largest, 39m . 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 Acute respiratory failure ([place] 11/21/2014  . Diabetic diarrhea ([place] 10/03/2014  . DM type 2, uncontrolled, with renal complications ([place] 80/05/270 . Cerebral infarct ([place] 08/17/2013  . Arthritis 08/05/2013  . 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 Hyperlipidemia LDL goal <100 02/23/2014  . COPD (chronic obstructive pulmonary disease) ([facility]     HOSPITAL COURSE:   74year old female with a history of ischemic cardiomyopathy, diabetes and CAD who presented after fall and was found to have acute renal failure.   1. Acute on chronic renal failure stage III: Patient's baseline creatinine is 1.4-1.5. Creatinine has imroved with IV fluids. HEr AKI was due to prerenal azotemia secondary to dehydration and orthostasis from her diuretic. As per Cardiology consult, patient can resume her outpatient dose of LASIX and she is also on ARB. Her creatinine at discharge is 1.3.  2. Urinary tract infection: ESBL UTI as per urine culture sensitive to MACROBID and SULFA and INIPENEM. She will be discharged on IM ERTAPENEM for 10 more days.   3. Diabetes type II: Continue sliding scale insulin, ADA diet and Lantus.  4. Ischemic cardiomyopathy: Continue Imdur, metoprolol, BRILANTAand aspirin.   DISCHARGE CONDITIONS AND DIET:  Diabetic diet Stable condition   CONSULTS OBTAINED:  Treatment Team:  Wellington Hampshire, MD  DRUG ALLERGIES:   Allergies  Allergen Reactions  . Contrast Media [Iodinated Diagnostic Agents] Itching  . Gabapentin Itching  . Sulfa Antibiotics Itching    DISCHARGE MEDICATIONS:   Current Discharge Medication List    START taking these medications    Details  ERTAPENEM 1 gram IM q24h until 12/21       CONTINUE these medications which have NOT CHANGED   Details  albuterol (PROVENTIL) (2.5 MG/3ML) 0.083% nebulizer solution Take 3 mLs (2.5 mg total) by nebulization every 6 (six) hours as needed for wheezing or shortness of breath. Qty: 150 mL, Refills: 1    alendronate (FOSAMAX) 70 MG tablet Take with a full glass of water on an empty stomach every Sunday  Take with a full glass of water and take no other meds or food for   30 minutes . Qty: 12 tablet, Refills: 1    ALPRAZolam (XANAX) 0.5 MG tablet Take 1 tablet (0.5 mg total) by mouth daily as needed for sleep or anxiety. Qty: 90 tablet, Refills: 0    aspirin 81 MG EC tablet Take 1 tablet (81 mg total) by mouth daily. Swallow whole. Qty: 30 tablet, Refills: 12    benzonatate (TESSALON) 100 MG capsule Take 2 capsules (200 mg total) by mouth 3 (three) times daily as needed for cough. Qty: 90 capsule, Refills: 0    Blood Glucose Monitoring Suppl (ACCU-CHEK AVIVA) device Use as instructed Qty: 1 each, Refills: 0    Blood Glucose Monitoring Suppl (TRUERESULT BLOOD GLUCOSE) W/DEVICE KIT 1 kit by Does not apply route 3 (three) times daily. Qty: 1 each, Refills: 0    BRILINTA 90 MG TABS tablet Take 1 tablet (90 mg total) by mouth 2 (two) times daily. Qty: 180 tablet, Refills: 2   Associated Diagnoses: Cardiomyopathy, ischemic    budesonide-formoterol (SYMBICORT) 160-4.5 MCG/ACT inhaler Inhale 2 puffs into the lungs 2 (two) times daily. Qty: 1 Inhaler, Refills: 0    COMBIVENT RESPIMAT 20-100 MCG/ACT AERS respimat Inhale 1 puff into the lungs 3 (three) times daily as needed. Qty: 2 Inhaler, Refills: 3    fluticasone (FLONASE) 50 MCG/ACT nasal spray Place 2 sprays into both nostrils daily. Qty: 16 g, Refills: 2    furosemide (LASIX) 20 MG tablet Take 1 tablet (20 mg total) by mouth daily. Qty: 90 tablet, Refills: 1   Associated Diagnoses: Cardiomyopathy, ischemic    glucose blood  (ACCU-CHEK AVIVA) test strip Use up to three times a day. Qty: 100 each, Refills: 11    insulin aspart protamine - aspart (NOVOLOG 70/30 MIX) (70-30) 100 UNIT/ML FlexPen 60 units before breakfast,  60 units before dinner.  NEEDS 15 PENS PER MONTH Qty: 105 mL, Refills: 2    Insulin Pen Needle (B-D UF III MINI PEN NEEDLES) 31G X 5 MM MISC Use  Three times daily Qty: 300 each, Refills: 3    isosorbide mononitrate (IMDUR) 30 MG 24 hr tablet Take 1 tablet (30 mg total) by mouth daily. Qty: 90 tablet, Refills: 1   Associated Diagnoses: Cardiomyopathy, ischemic    Lactulose 20 GM/30ML SOLN 30 ml every 4 hours until constipation is relieved Qty: 236 mL, Refills: 3    Lancet Devices (ACCU-CHEK SOFTCLIX) lancets Utilize lancets three times a day to check blood glucose levels. Qty: 1 each, Refills: 11    losartan (COZAAR) 100 MG tablet Take 1 tablet (100 mg total) by mouth  daily. Qty: 90 tablet, Refills: 2    metoprolol tartrate (LOPRESSOR) 25 MG tablet Take 0.5 tablets (12.5 mg total) by mouth 2 (two) times daily. Qty: 180 tablet, Refills: 1   Associated Diagnoses: Cardiomyopathy, ischemic    nitroGLYCERIN (NITROSTAT) 0.4 MG SL tablet Place 1 tablet (0.4 mg total) under the tongue every 5 (five) minutes as needed for chest pain. Qty: 25 tablet, Refills: 3    PARoxetine (PAXIL) 10 MG tablet Take 1 tablet (10 mg total) by mouth daily. Qty: 90 tablet, Refills: 1    pregabalin (LYRICA) 100 MG capsule Take 1 capsule (100 mg total) by mouth 3 (three) times daily. Qty: 270 capsule, Refills: 3    rosuvastatin (CRESTOR) 20 MG tablet Take 1 tablet (20 mg total) by mouth daily. Qty: 90 tablet, Refills: 1    Spacer/Aero-Holding Chambers (AEROCHAMBER MV) inhaler Use as instructed Qty: 1 each, Refills: 0    traMADol (ULTRAM) 50 MG tablet Take 1 tablet (50 mg total) by mouth every 6 (six) hours as needed for moderate pain. Qty: 120 tablet, Refills: 3    TRUEPLUS LANCETS 30G MISC USE TO CHECK  GLUCOSE THREE TIMES DAILY Qty: 400 each, Refills: 0    Vitamin D, Ergocalciferol, (DRISDOL) 50000 UNITS CAPS capsule Take 50,000 Units by mouth every 7 (seven) days.      STOP taking these medications     meloxicam (MOBIC) 15 MG tablet               Today   CHIEF COMPLAINT:   Doing better this am, still weak on left leg but better range of motion   VITAL SIGNS:  Blood pressure 166/54, pulse 56, temperature 97.6 F (36.4 C), temperature source Oral, resp. rate 20, height 5' 6"  (1.676 m), weight 87.816 kg (193 lb 9.6 oz), SpO2 100 %.   REVIEW OF SYSTEMS:  Review of Systems  Constitutional: Negative for fever, chills and malaise/fatigue.  HENT: Negative for sore throat.   Eyes: Negative for blurred vision.  Respiratory: Negative for cough, hemoptysis, shortness of breath and wheezing.   Cardiovascular: Negative for chest pain, palpitations and leg swelling.  Gastrointestinal: Negative for nausea, vomiting, abdominal pain, diarrhea and blood in stool.  Genitourinary: Negative for dysuria.  Musculoskeletal: Negative for back pain (better). Joint pain: better knee and better ROM.  Neurological: Negative for dizziness, tremors and headaches.  Endo/Heme/Allergies: Does not bruise/bleed easily.     PHYSICAL EXAMINATION:  GENERAL:  74 y.o.-year-old patient lying in the bed with no acute distress.  NECK:  Supple, no jugular venous distention. No thyroid enlargement, no tenderness.  LUNGS: Normal breath sounds bilaterally, no wheezing, rales,rhonchi  No use of accessory muscles of respiration.  CARDIOVASCULAR: S1, S2 normal. No murmurs, rubs, or gallops.  ABDOMEN: Soft, non-tender, non-distended. Bowel sounds present. No organomegaly or mass.  EXTREMITIES: No pedal edema, cyanosis, or clubbing. Left LE 4+/5 PSYCHIATRIC: The patient is alert and oriented x 3.  SKIN: small rash on her right wrist  DATA REVIEW:   CBC  Recent Labs Lab 04/22/15 0541  WBC 6.1  HGB 11.0*   HCT 32.2*  PLT 252    Chemistries   Recent Labs Lab 04/19/15 1634  04/22/15 0541  NA 137  < > 138  K 4.3  < > 4.8  CL 100*  < > 107  CO2 28  < > 26  GLUCOSE 196*  < > 150*  BUN 37*  < > 31*  CREATININE 2.04*  < > 1.33*  CALCIUM 9.8  < > 9.7  MG 2.3  --   --   AST 20  --   --   ALT 18  --   --   ALKPHOS 59  --   --   BILITOT 0.5  --   --   < > = values in this interval not displayed.  Cardiac Enzymes  Recent Labs Lab 04/19/15 1634 04/19/15 2040  TROPONINI 0.03 <0.03    Microbiology Results  @MICRORSLT48 @  RADIOLOGY:  Ct Head Wo Contrast  04/20/2015  CLINICAL DATA:  Left-sided numbness and tingling EXAM: CT HEAD WITHOUT CONTRAST TECHNIQUE: Contiguous axial images were obtained from the base of the skull through the vertex without intravenous contrast. COMPARISON:  November 14, 2013 FINDINGS: The ventricles are normal in size and configuration. There is no intracranial mass, hemorrhage, extra-axial fluid collection, or midline shift. Gray-white compartments appear normal. No acute infarct evident. Basal ganglia calcification is again noted, a finding most likely physiologic in this age group. Bony calvarium appears intact. Visualized mastoid air cells are clear. No intraorbital lesions are identified. IMPRESSION: No intracranial mass, hemorrhage, or evidence of acute infarct. Basal ganglia calcification is stable, a finding that is most likely physiologic in this age group. Electronically Signed   By: Lowella Grip III M.D.   On: 04/20/2015 13:02   Dg Knee Left Port  04/20/2015  CLINICAL DATA:  Progressive left knee pain.  Fall yesterday. EXAM: PORTABLE LEFT KNEE - 1-2 VIEW COMPARISON:  None. FINDINGS: Left knee is located. No acute bone or soft tissue abnormality is present. Joint spaces are preserved. No significant osteophytes are present. There is no fusion. Extensive vascular calcifications are present. IMPRESSION: 1. No acute abnormality. 2. Atherosclerosis.  Electronically Signed   By: San Morelle M.D.   On: 04/20/2015 12:50      Management plans discussed with the patient and she is in agreement. Stable for discharge SNF  Patient should follow up with PCP in 1 week and CARDIOLOGY in 1 week  CODE STATUS:     Code Status Orders        Start     Ordered   04/20/15 0118  Full code   Continuous     04/20/15 0117    Advance Directive Documentation        Most Recent Value   Type of Advance Directive  Healthcare Power of Cool, Living will   Pre-existing out of facility DNR order (yellow form or pink MOST form)     "MOST" Form in Place?        TOTAL TIME TAKING CARE OF THIS PATIENT: 35 minutes.    Note: This dictation was prepared with Dragon dictation along with smaller phrase technology. Any transcriptional errors that result from this process are unintentional.  Aarica Wax M.D on 04/22/2015 at 9:18 AM  Between 7am to 6pm - Pager - [phone] After 6pm go to [email or web address] - password [email or web address] [place] [facility]  Office  [phone]  CC: Primary care physician; [name], MD

## 2015-04-22 NOTE — Plan of Care (Signed)
Problem: Physical Regulation: Goal: Will remain free from infection Outcome: Not Progressing Contact precaution for ESBL 04/19/2015     Problem: Activity: Goal: Risk for activity intolerance will decrease Outcome: Progressing Uses walker

## 2015-04-22 NOTE — Clinical Social Work Placement (Signed)
   CLINICAL SOCIAL WORK PLACEMENT  NOTE  Date:  04/22/2015  Patient Details  [name] MRN: [id number] Date of Birth: 05-27-1940  Clinical Social Work is seeking post-discharge placement for this patient at the [place] level of care (*CSW will initial, date and re-position this form in  chart as items are completed):  Yes   Patient/family provided with [place] Work Department's list of facilities offering this level of care within the geographic area requested by the patient (or if unable, by the patient's family).  Yes   Patient/family informed of their freedom to choose among providers that offer the needed level of care, that participate in Medicare, Medicaid or managed care program needed by the patient, have an available bed and are willing to accept the patient.      Patient/family informed of [facility]'s ownership interest in [facility] and [facility], as well as of the fact that they are under no obligation to receive care at these facilities.  PASRR submitted to EDS on 04/21/15     PASRR number received on 04/21/15     Existing PASRR number confirmed on       FL2 transmitted to all facilities in geographic area requested by pt/family on 04/21/15     FL2 transmitted to all facilities within larger geographic area on       Patient informed that his/her managed care company has contracts with or will negotiate with certain facilities, including the following:        Yes   Patient/family informed of bed offers received.  Patient chooses bed at  [facility])     Physician recommends and patient chooses bed at  [facility])    Patient to be transferred to  [facility]) on 04/22/15.  Patient to be transferred to facility by  (EMS)     Patient family notified on 04/22/15 of transfer.  Name of family member notified:   (Patient to notify her husband )     PHYSICIAN Please sign FL2     Additional Comment:     _______________________________________________ Shela Leff, LCSW 04/22/2015, 2:12 PM

## 2015-04-22 NOTE — Clinical Social Work Note (Signed)
Kim contacted CSW to inform that due to ESBL, they are unable to accept patient due to not having a private room. CSW touched base with the facilities that patient had other bed offers from: [facility] and [facility] and both ended up being able to take patient. Patient did not want to go to [facility] due to family being there prior. Patient is in agreement to go to [facility] but is very unhappy about not being able to go to [place]. Patient states her husband is aware and coming up to the hospital. Alyse Low at [facility] gave approval to send patient today about 3pm. Discharge information sent to [facility] via [place]. Patient wishes to transport via EMS. Shela Leff MSW,LCSW [phone]

## 2015-04-23 ENCOUNTER — Other Ambulatory Visit: Payer: Self-pay | Admitting: *Deleted

## 2015-04-23 NOTE — Patient Outreach (Signed)
[facility]) Care Management  04/23/2015  Pamela Collier 03/17/1941 [id number]   Phone call to patient to confirm her discharge to [facility].  Patient has been admitted, this social worker will follow up with her at [facility] within 7 business days.    [name] [facility] Care Management [phone]

## 2015-04-24 ENCOUNTER — Non-Acute Institutional Stay (SKILLED_NURSING_FACILITY): Payer: Medicare Other | Admitting: Internal Medicine

## 2015-04-24 DIAGNOSIS — Z794 Long term (current) use of insulin: Secondary | ICD-10-CM

## 2015-04-24 DIAGNOSIS — E1121 Type 2 diabetes mellitus with diabetic nephropathy: Secondary | ICD-10-CM

## 2015-04-24 DIAGNOSIS — Z1612 Extended spectrum beta lactamase (ESBL) resistance: Secondary | ICD-10-CM

## 2015-04-24 DIAGNOSIS — J449 Chronic obstructive pulmonary disease, unspecified: Secondary | ICD-10-CM

## 2015-04-24 DIAGNOSIS — M810 Age-related osteoporosis without current pathological fracture: Secondary | ICD-10-CM | POA: Diagnosis not present

## 2015-04-24 DIAGNOSIS — I255 Ischemic cardiomyopathy: Secondary | ICD-10-CM

## 2015-04-24 DIAGNOSIS — N189 Chronic kidney disease, unspecified: Secondary | ICD-10-CM

## 2015-04-24 DIAGNOSIS — F32A Depression, unspecified: Secondary | ICD-10-CM

## 2015-04-24 DIAGNOSIS — R531 Weakness: Secondary | ICD-10-CM | POA: Diagnosis not present

## 2015-04-24 DIAGNOSIS — E1165 Type 2 diabetes mellitus with hyperglycemia: Secondary | ICD-10-CM

## 2015-04-24 DIAGNOSIS — E1342 Other specified diabetes mellitus with diabetic polyneuropathy: Secondary | ICD-10-CM | POA: Diagnosis not present

## 2015-04-24 DIAGNOSIS — F329 Major depressive disorder, single episode, unspecified: Secondary | ICD-10-CM

## 2015-04-24 DIAGNOSIS — A499 Bacterial infection, unspecified: Secondary | ICD-10-CM

## 2015-04-24 DIAGNOSIS — I10 Essential (primary) hypertension: Secondary | ICD-10-CM | POA: Diagnosis not present

## 2015-04-24 DIAGNOSIS — IMO0002 Reserved for concepts with insufficient information to code with codable children: Secondary | ICD-10-CM

## 2015-04-24 DIAGNOSIS — N179 Acute kidney failure, unspecified: Secondary | ICD-10-CM | POA: Diagnosis not present

## 2015-04-24 DIAGNOSIS — R195 Other fecal abnormalities: Secondary | ICD-10-CM | POA: Diagnosis not present

## 2015-04-24 NOTE — Progress Notes (Signed)
Patient ID: Pamela Collier, female   DOB: 20-Jun-1940, 74 y.o.   MRN: [id number]     Facility: [facility]    PCP: [name], MD   Allergies  Allergen Reactions  . Contrast Media [Iodinated Diagnostic Agents] Itching  . Gabapentin Itching  . Sulfa Antibiotics Itching    Chief Complaint  Patient presents with  . New Admit To SNF     HPI:  74 y.o. patient is here for short term rehabilitation post hospital admission from 04/19/15-04/22/15 with generalized weakness and acute on chronic renal failure. She received iv fluids. She was also treated for ESBL UTI and continues to be on antibiotics. She has PMH of DM, depression, HLD, HTN, chronic diastolic CHF among others. She is seen in her room today. She has been having loose stool. She complaints of dyspnea with exertion. She also has some dizziness.   Review of Systems:  Constitutional: Negative for fever, chills, diaphoresis.  HENT: Negative for headache, congestion, nasal discharge Eyes: Negative for  blurred vision, double vision and discharge.  Respiratory: Negative for cough, shortness of breath and wheezing.   Cardiovascular: Negative for chest pain, palpitations, leg swelling.  Gastrointestinal: Negative for heartburn, nausea, vomiting, abdominal pain Genitourinary: Negative for dysuria.  Musculoskeletal: Negative for back pain, falls Skin: Negative for itching, rash.  Psychiatric/Behavioral: Negative for depression   Past Medical History  Diagnosis Date  . Diabetes mellitus   . 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 Cardiomyopathy, ischemic Jan 2013    EF 35 to 40% [facility])  . Hepatic steatosis     by CT abd pelvis  . Osteoporosis, post-menopausal   . 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 Atherosclerotic peripheral vascular disease with gangrene  [facility]) august 2012  . Hypertension   . Hyperlipidemia   . Sarcoidosis ([place])   . CHF (congestive heart failure) ([place])   . Renal insufficiency   . 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 CCF (congestive cardiac failure) ([place]) 08/17/2013  . Pleural effusion 10/25/2012    10/2012 CT chest >> small to moderate R lung effusion>> chylothorax, 100% lymphs 10/2013 thoracentesis> cytology negative, WBC 1471, > 90% "small lymphs" 01/2014 CT chest> near complete resolution of pleural effusion, stable lymphadenopathy 08/2014 CT chest [place] ([facility]): small right sided effusion decreased in size, stable mediastinal lymphadenopathy 1.0cm largest, 62m . 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 Acute respiratory failure ([facility] 11/21/2014  . Diabetic diarrhea ([place] 10/03/2014  . DM type 2, uncontrolled, with renal complications ([facility] 86/06/1306 . Cerebral infarct ([facility] 08/17/2013  . Arthritis 08/05/2013  . 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 Hyperlipidemia LDL goal <100 02/23/2014  . COPD (chronic obstructive pulmonary disease) ([facility]    Past Surgical History  Procedure Laterality Date  . Abdominal hysterectomy      at ge 39. secondary to bleeding  . Toe amputation  Sept 2012    Right 2nd toe, [place]  . Ptca  August 2012    Right Posterior tibial artery ,  [place]  . Colonoscopy with propofol N/A 01/09/2015    Procedure: COLONOSCOPY WITH PROPOFOL;  Surgeon: Lucilla Lame, MD;  Location: [facility] ENDOSCOPY;  Service: Endoscopy;  Laterality: N/A;  . Cholecystectomy    . Carpal tunnel release Bilateral   . Breast cyst aspiration Right   . Coronary angioplasty with stent placement      [place]; [facility]   Social History:   reports that  she has never smoked. She has never used smokeless tobacco. She reports that she does not drink alcohol or use illicit drugs.  Family History  Problem Relation Age of Onset  . Heart disease Father   . Heart attack Father   . Heart disease Sister   . Heart attack Sister   . Heart disease Brother   . Heart attack Brother   . Asthma Grandchild   . Breast cancer Maternal Aunt     Medications:   Medication List       This list is accurate as of: 04/24/15  9:07 AM.  Always use your most recent med list.               accu-chek softclix lancets  Utilize lancets three times a day to check blood glucose levels.     AEROCHAMBER MV inhaler  Use as instructed     albuterol (2.5 MG/3ML) 0.083% nebulizer solution  Commonly known as:  PROVENTIL  Take 3 mLs (2.5 mg total) by nebulization every 6 (six) hours as needed for wheezing or shortness of breath.     alendronate 70 MG tablet  Commonly known as:  FOSAMAX  Take with a full glass of water on an empty stomach every Sunday  Take with a full glass of water and take no other meds or food for   30 minutes .     ALPRAZolam 0.5 MG tablet  Commonly known as:  XANAX  Take 1 tablet (0.5 mg total) by mouth daily as needed for sleep or anxiety.     aspirin 81 MG EC tablet  Take 1 tablet (81 mg total) by mouth daily. Swallow whole.     benzonatate 100 MG capsule  Commonly known as:  TESSALON  Take 2 capsules (200 mg total) by mouth 3 (three) times daily as needed for cough.     BRILINTA 90 MG Tabs tablet  Generic drug:  ticagrelor  Take 1 tablet (90 mg total) by mouth 2 (two) times daily.     budesonide-formoterol 160-4.5 MCG/ACT inhaler  Commonly known as:  SYMBICORT  Inhale 2 puffs into the lungs 2 (two) times daily.     COMBIVENT RESPIMAT 20-100 MCG/ACT Aers respimat  Generic drug:  Ipratropium-Albuterol  Inhale 1 puff into the lungs 3 (three) times daily as needed.     ertapenem 1 G injection  Commonly known as:  INVANZ    Inject 1 g into the muscle daily.     fluticasone 50 MCG/ACT nasal spray  Commonly known as:  FLONASE  Place 2 sprays into both nostrils daily.     furosemide 20 MG tablet  Commonly known as:  LASIX  Take 1 tablet (20 mg total) by mouth daily.     glucose blood test strip  Commonly known as:  ACCU-CHEK AVIVA  Use up to three times a day.     insulin aspart protamine - aspart (70-30) 100 UNIT/ML FlexPen  Commonly known as:  NOVOLOG 70/30 MIX  60 units before breakfast,  60 units before  dinner.  NEEDS 15 PENS PER MONTH     Insulin Pen Needle 31G X 5 MM Misc  Commonly known as:  B-D UF III MINI PEN NEEDLES  Use  Three times daily     isosorbide mononitrate 30 MG 24 hr tablet  Commonly known as:  IMDUR  Take 1 tablet (30 mg total) by mouth daily.     Lactulose 20 GM/30ML Soln  30 ml every 4 hours until constipation is relieved     losartan 100 MG tablet  Commonly known as:  COZAAR  Take 1 tablet (100 mg total) by mouth daily.     metoprolol tartrate 25 MG tablet  Commonly known as:  LOPRESSOR  Take 0.5 tablets (12.5 mg total) by mouth 2 (two) times daily.     nitroGLYCERIN 0.4 MG SL tablet  Commonly known as:  NITROSTAT  Place 1 tablet (0.4 mg total) under the tongue every 5 (five) minutes as needed for chest pain.     PARoxetine 10 MG tablet  Commonly known as:  PAXIL  Take 1 tablet (10 mg total) by mouth daily.     pregabalin 100 MG capsule  Commonly known as:  LYRICA  Take 1 capsule (100 mg total) by mouth 3 (three) times daily.     rosuvastatin 20 MG tablet  Commonly known as:  CRESTOR  Take 1 tablet (20 mg total) by mouth daily.     traMADol 50 MG tablet  Commonly known as:  ULTRAM  Take 1 tablet (50 mg total) by mouth every 6 (six) hours as needed for moderate pain.     TRUEPLUS LANCETS 30G Misc  USE TO CHECK GLUCOSE THREE TIMES DAILY     TRUERESULT BLOOD GLUCOSE W/DEVICE Kit  1 kit by Does not apply route 3 (three) times daily.     ACCU-CHEK AVIVA  device  Use as instructed     Vitamin D (Ergocalciferol) 50000 UNITS Caps capsule  Commonly known as:  DRISDOL  Take 50,000 Units by mouth every 7 (seven) days.         Physical Exam: 147/89, 78, 16, 96%  General- elderly female, obese, in no acute distress Head- normocephalic, atraumatic Nose- no maxillary or frontal sinus tenderness, no nasal discharge Throat- moist mucus membrane Eyes- PERRLA, EOMI, no pallor, no icterus Neck- no cervical lymphadenopathy Cardiovascular- normal s1,s2, no murmurs, no leg edema Respiratory- bilateral clear to auscultation, no wheeze, no rhonchi, no crackles, no use of accessory muscles Abdomen- bowel sounds present, soft, non tender Musculoskeletal- able to move all 4 extremities, generalized weakness Neurological- no focal deficit, alert and oriented to person, place and time Skin- warm and dry   Labs reviewed: Basic Metabolic Panel:  Recent Labs  04/19/15 1634  04/20/15 0628 04/21/15 0645 04/22/15 0541  NA 137  < > 140 140 138  K 4.3  < > 4.3 4.7 4.8  CL 100*  < > 108 108 107  CO2 28  < > 25 27 26   GLUCOSE 196*  < > 164* 163* 150*  BUN 37*  < > 47* 37* 31*  CREATININE 2.04*  < > 2.16* 1.56* 1.33*  CALCIUM 9.8  < > 9.1 9.5 9.7  MG 2.3  --   --   --   --   < > = values in this interval not displayed. Liver Function Tests:  Recent Labs  02/09/15 1646 03/05/15 0857 04/19/15 1634  AST 18 17 20   ALT 13 15 18   ALKPHOS 45  50 59  BILITOT 0.8 0.9 0.5  PROT 7.1 7.4 7.6  ALBUMIN 3.7 3.8 3.7    Recent Labs  04/19/15 1634  LIPASE 43   No results for input(s): AMMONIA in the last 8760 hours. CBC:  Recent Labs  02/09/15 1646 03/05/15 0857 04/19/15 1634 04/20/15 0628 04/22/15 0541  WBC 7.5 10.4 8.0 6.8 6.1  NEUTROABS 5.0 7.9* 5.8  --   --   HGB 11.0* 12.0 11.9* 10.2* 11.0*  HCT 33.5* 36.2 34.8* 30.4* 32.2*  MCV 88.6 88.2 88.5 88.3 88.2  PLT 259 274.0 312 242 252   Cardiac Enzymes:  Recent Labs  01/11/15 1154  02/09/15 1522 04/19/15 1634 04/19/15 2040  CKTOTAL  --  125  --   --   CKMB  --  CANCELED  --   --   TROPONINI <0.03  --  0.03 <0.03   BNP: Invalid input(s): POCBNP CBG:  Recent Labs  04/21/15 2043 04/22/15 0731 04/22/15 1211  GLUCAP 160* 133* 163*    Assessment/Plan  Generalized weakness Will have her work with physical therapy and occupational therapy team to help with gait training and muscle strengthening exercises.fall precautions. Skin care. Encourage to be out of bed.   ESBL UTI Continue ertapenem 1 g im daily until 05/02/15. Hydration to be maintained. Add florastor 250 mg bid x 2 weeks to prevent antibiotic associated diarrhea  Acute on chronic renal failure Thought to be from dehydration with her diuretics. S/p iv fluids. Monitor BMP  Loose stool Discontinue lactulose for now and add florastor as above and monitor. Hydration to be maintained, check orthostatic vitals daily for now. Check bmp  DM type 2 Lab Results  Component Value Date   HGBA1C 7.5* 03/05/2015  monitor cbg. Continue aspart 60 u with breakfast and dinner   Ischemic cardiomyopathy Remains chest pain free. Continue baby aspirin ,brilinta 90 mg bid, imdur 30 mg daily, cozaar 100 mg daily, lasix 20 mg daily and lopressor along with prn NTG. Continue crestor 20 mg daily  HTN On imdur, cozaar, lasix and lopressor. Discharged from hospital on lopressor 12.5 mg bid. Currently on lopressor 12.5 mg daily. Change this to lopressor 12.5 mg bid. Monitor BP  COPD Continue symbicort with combivent and prn albuterol  Chronic depression Stable, continue paxil 10 mg daily and xanax 0.5 mg qhs prn  Diabetic neuropathy Continue lyrica 100 mg tid and tramadol 50 mg q6h prn pain  Osteoporosis Continue weekly fosamax and weekly vitamin d supplement. Fall precautions  Goals of care: short term rehabilitation   Labs/tests ordered: cbc with diff, cmp  Family/ staff Communication: reviewed care plan with  patient and nursing supervisor    Blanchie Serve, MD  [facility] [phone] (Monday-Friday 8 am - 5 pm) [phone] (afterhours)

## 2015-04-25 LAB — HEPATIC FUNCTION PANEL
ALT: 17 U/L (ref 7–35)
AST: 22 U/L (ref 13–35)
Alkaline Phosphatase: 55 U/L (ref 25–125)
Bilirubin, Total: 0.7 mg/dL

## 2015-04-25 LAB — BASIC METABOLIC PANEL
BUN: 39 mg/dL — AB (ref 4–21)
CREATININE: 1.9 mg/dL — AB (ref 0.5–1.1)
GLUCOSE: 106 mg/dL
POTASSIUM: 4.7 mmol/L (ref 3.4–5.3)
SODIUM: 139 mmol/L (ref 137–147)

## 2015-04-25 LAB — CBC AND DIFFERENTIAL
HCT: 35 % — AB (ref 36–46)
Hemoglobin: 10.8 g/dL — AB (ref 12.0–16.0)
Platelets: 291 10*3/uL (ref 150–399)
WBC: 7.1 10*3/mL

## 2015-04-26 DIAGNOSIS — L97522 Non-pressure chronic ulcer of other part of left foot with fat layer exposed: Secondary | ICD-10-CM | POA: Diagnosis not present

## 2015-05-03 ENCOUNTER — Other Ambulatory Visit: Payer: Self-pay | Admitting: *Deleted

## 2015-05-03 NOTE — Patient Outreach (Addendum)
[place] [facility]) Care Management  05/03/2015  Pamela Collier [birth year] [id number]   Phone call to [facility], spoke with discharge planner, Marita Kansas [phone] who states that there has been no discharge date set for patient yet.  However, there is a care plan meeting set for today at 2;30pm and she will have more information at that time.  It was confirmed that patient is receiving physical and occupational therapy daily.  Plan:  This social worker will follow up with the discharge planner at [facility] regarding patient's discharge plan            Visit scheduled for 05/08/15.   [name] [facility] Care Management [phone]

## 2015-05-04 ENCOUNTER — Other Ambulatory Visit: Payer: Self-pay | Admitting: *Deleted

## 2015-05-04 NOTE — Patient Outreach (Signed)
[place] [facility]) Care Management  05/04/2015  DYEMOND WORMAN 03-10-1941 [id number]    Phone call to discharge planner at [facility] to check status of patient's discharge .  Patient was very apologetic stating that patient's care planning meeting is today at 2:30pm not yesterday.  Per Ms. Lucia Gaskins, she will call this social worker with an update on patient's discharge plans.   Plan:  This social worker will plan to visit patient on 05/08/15.    [name] [facility] Care Management [phone]

## 2015-05-08 ENCOUNTER — Other Ambulatory Visit: Payer: Self-pay | Admitting: *Deleted

## 2015-05-08 NOTE — Patient Outreach (Signed)
[place] [facility]) Care Management  [facility] Social Work  05/08/2015  MIKAH ROTTINGHAUS 11/21/1940 [id number]  Subjective:    Objective:   Current Medications:  Current Outpatient Prescriptions  Medication Sig Dispense Refill  . albuterol (PROVENTIL) (2.5 MG/3ML) 0.083% nebulizer solution Take 3 mLs (2.5 mg total) by nebulization every 6 (six) hours as needed for wheezing or shortness of breath. 150 mL 1  . alendronate (FOSAMAX) 70 MG tablet Take with a full glass of water on an empty stomach every Sunday  Take with a full glass of water and take no other meds or food for   30 minutes . 12 tablet 1  . ALPRAZolam (XANAX) 0.5 MG tablet Take 1 tablet (0.5 mg total) by mouth daily as needed for sleep or anxiety. 90 tablet 0  . aspirin 81 MG EC tablet Take 1 tablet (81 mg total) by mouth daily. Swallow whole. 30 tablet 12  . benzonatate (TESSALON) 100 MG capsule Take 2 capsules (200 mg total) by mouth 3 (three) times daily as needed for cough. 90 capsule 0  . Blood Glucose Monitoring Suppl (ACCU-CHEK AVIVA) device Use as instructed 1 each 0  . Blood Glucose Monitoring Suppl (TRUERESULT BLOOD GLUCOSE) W/DEVICE KIT 1 kit by Does not apply route 3 (three) times daily. 1 each 0  . BRILINTA 90 MG TABS tablet Take 1 tablet (90 mg total) by mouth 2 (two) times daily. 180 tablet 2  . budesonide-formoterol (SYMBICORT) 160-4.5 MCG/ACT inhaler Inhale 2 puffs into the lungs 2 (two) times daily. 1 Inhaler 0  . COMBIVENT RESPIMAT 20-100 MCG/ACT AERS respimat Inhale 1 puff into the lungs 3 (three) times daily as needed. (Patient taking differently: Inhale 1 puff into the lungs 3 (three) times daily as needed for wheezing or shortness of breath. ) 2 Inhaler 3  . fluticasone (FLONASE) 50 MCG/ACT nasal spray Place 2 sprays into both nostrils daily. 16 g 2  . furosemide (LASIX) 20 MG tablet Take 1 tablet (20 mg total) by mouth daily. 90 tablet 1  . glucose blood (ACCU-CHEK AVIVA) test strip Use up to  three times a day. 100 each 11  . insulin aspart protamine - aspart (NOVOLOG 70/30 MIX) (70-30) 100 UNIT/ML FlexPen 60 units before breakfast,  60 units before dinner.  NEEDS 15 PENS PER MONTH (Patient taking differently: Inject 44-50 Units into the skin See admin instructions. 44 units before breakfast and 50 units before supper) 105 mL 2  . Insulin Pen Needle (B-D UF III MINI PEN NEEDLES) 31G X 5 MM MISC Use  Three times daily 300 each 3  . isosorbide mononitrate (IMDUR) 30 MG 24 hr tablet Take 1 tablet (30 mg total) by mouth daily. 90 tablet 1  . Lactulose 20 GM/30ML SOLN 30 ml every 4 hours until constipation is relieved (Patient taking differently: Take 30 mLs by mouth every 4 (four) hours as needed (for constipation). ) 236 mL 3  . Lancet Devices (ACCU-CHEK SOFTCLIX) lancets Utilize lancets three times a day to check blood glucose levels. 1 each 11  . losartan (COZAAR) 100 MG tablet Take 1 tablet (100 mg total) by mouth daily. 90 tablet 2  . metoprolol tartrate (LOPRESSOR) 25 MG tablet Take 0.5 tablets (12.5 mg total) by mouth 2 (two) times daily. 180 tablet 1  . nitroGLYCERIN (NITROSTAT) 0.4 MG SL tablet Place 1 tablet (0.4 mg total) under the tongue every 5 (five) minutes as needed for chest pain. 25 tablet 3  . PARoxetine (PAXIL) 10 MG  tablet Take 1 tablet (10 mg total) by mouth daily. 90 tablet 1  . pregabalin (LYRICA) 100 MG capsule Take 1 capsule (100 mg total) by mouth 3 (three) times daily. 270 capsule 3  . rosuvastatin (CRESTOR) 20 MG tablet Take 1 tablet (20 mg total) by mouth daily. (Patient taking differently: Take 20 mg by mouth at bedtime. ) 90 tablet 1  . Spacer/Aero-Holding Chambers (AEROCHAMBER MV) inhaler Use as instructed 1 each 0  . traMADol (ULTRAM) 50 MG tablet Take 1 tablet (50 mg total) by mouth every 6 (six) hours as needed for moderate pain. 120 tablet 3  . TRUEPLUS LANCETS 30G MISC USE TO CHECK GLUCOSE THREE TIMES DAILY 400 each 0  . Vitamin D, Ergocalciferol,  (DRISDOL) 50000 UNITS CAPS capsule Take 50,000 Units by mouth every 7 (seven) days.     No current facility-administered medications for this visit.   Facility-Administered Medications Ordered in Other Visits  Medication Dose Route Frequency Provider Last Rate Last Dose  . albuterol (PROVENTIL) (5 MG/ML) 0.5% nebulizer solution 2.5 mg  2.5 mg Nebulization Once [name], MD        Functional Status:  In your present state of health, do you have any difficulty performing the following activities: 04/20/2015 10/20/2014  Hearing? N N  Vision? N N  Difficulty concentrating or making decisions? Y N  Walking or climbing stairs? Y Y  Dressing or bathing? Y N  Doing errands, shopping? Y N  Preparing Food and eating ? - N  Using the Toilet? - N  In the past six months, have you accidently leaked urine? - N  Do you have problems with loss of bowel control? - N  Managing your Medications? - N  Managing your Finances? - Y  Housekeeping or managing your Housekeeping? - Y    Fall/Depression Screening:  PHQ 2/9 Scores 10/30/2014 10/30/2014 10/20/2014 07/23/2014 11/22/2013 08/03/2013 04/03/2012  PHQ - 2 Score 0 0 _0 0 2  PHQ- 9 Score - - 5 - 3 - 10  Exception Documentation - - - Other- indicate reason in comment box - - -    Assessment:  This Education officer, museum visited patient at [employer]  Spoke with discharge planner Yehuda Budd  who stated that patient is set for discharge on 05/09/15 with home health, most likely [place]. No barriers to discharge verbalized Spoke with patient who describes active participating in treatment.  Reports being anxious to return home.  However discussed limited support from spouse, very disappointed by the level of support shown while she was in rehabilitation.  Per patient, she is considering privately paying for someone to help her clean her home. Patient also concerned because she has been talking a lot in her sleep and plans to discuss this with her  doctor when she returns for an appointment. . Plan:  Patient to discharge home on 05/09/15 with home health. RNCM Janci Minor to be notified of patient's discharge from [facility].  List of private duty home care to be mailed to patient.    [facility] CM Care Plan Problem One        Most Recent Value   Care Plan Problem One  patient in skilled nursing facility   Role Documenting the Problem One  Clinical Social Worker   Care Plan for Problem One  Active   [facility] CM Short Term Goal #1 (0-30 days)  patient and social worker to collaborate with discharge planner to establish a safe  and secure discharge plan for the next 2 days   [facility] CM Short Term Goal #1 Start Date  05/08/15   Interventions for Short Term Goal #1  patient attended  a discharge planning meeting on 05/04/15 to discuss her discharge plan, this social worker also spoke with discharge planner regarding patiient's plans for discharge and discussed any possible barriers     [place], [place] Management [phone]

## 2015-05-09 ENCOUNTER — Non-Acute Institutional Stay (SKILLED_NURSING_FACILITY): Payer: Medicare Other | Admitting: Nurse Practitioner

## 2015-05-09 ENCOUNTER — Encounter: Payer: Self-pay | Admitting: Nurse Practitioner

## 2015-05-09 DIAGNOSIS — E1165 Type 2 diabetes mellitus with hyperglycemia: Secondary | ICD-10-CM

## 2015-05-09 DIAGNOSIS — Z794 Long term (current) use of insulin: Secondary | ICD-10-CM | POA: Diagnosis not present

## 2015-05-09 DIAGNOSIS — M1990 Unspecified osteoarthritis, unspecified site: Secondary | ICD-10-CM

## 2015-05-09 DIAGNOSIS — E1122 Type 2 diabetes mellitus with diabetic chronic kidney disease: Secondary | ICD-10-CM

## 2015-05-09 DIAGNOSIS — N189 Chronic kidney disease, unspecified: Secondary | ICD-10-CM | POA: Diagnosis not present

## 2015-05-09 DIAGNOSIS — E1321 Other specified diabetes mellitus with diabetic nephropathy: Secondary | ICD-10-CM | POA: Diagnosis not present

## 2015-05-09 DIAGNOSIS — I5032 Chronic diastolic (congestive) heart failure: Secondary | ICD-10-CM | POA: Diagnosis not present

## 2015-05-09 DIAGNOSIS — N179 Acute kidney failure, unspecified: Secondary | ICD-10-CM | POA: Diagnosis not present

## 2015-05-09 DIAGNOSIS — I10 Essential (primary) hypertension: Secondary | ICD-10-CM

## 2015-05-09 NOTE — Progress Notes (Signed)
Patient ID: Pamela Collier, female   DOB: Apr 02, 1941, 74 y.o.   MRN: [id number]    Nursing Home Location:  [place] of Service: SNF (31)  PCP: [name], MD  Allergies  Allergen Reactions  . Contrast Media [Iodinated Diagnostic Agents] Itching  . Gabapentin Itching  . Sulfa Antibiotics Itching    Chief Complaint  Patient presents with  . Discharge Note    Discharge from SNF    HPI:  Patient is a 74 y.o. female seen today at [facility] for discharge home. She has PMH of DM, depression, HLD, HTN, chronic diastolic CHFPt here for short term rehabilitation post hospital admission from 04/19/15-04/22/15 with generalized weakness and acute on chronic renal failure which was treated with IV fluids. It was also found that she had UTI and was treated with IV invanz and has completed 10 day course. Pt reports she is feeling well. Eating and drinking good. Patient currently doing well with therapy, now stable to discharge home with home health.  Review of Systems:  Review of Systems  Constitutional: Negative for activity change, appetite change, fatigue and unexpected weight change.  HENT: Negative for congestion and hearing loss.   Eyes: Negative.   Respiratory: Negative for cough and shortness of breath.   Cardiovascular: Negative for chest pain, palpitations and leg swelling.  Gastrointestinal: Negative for abdominal pain, diarrhea and constipation.  Genitourinary: Negative for dysuria and difficulty urinating.  Musculoskeletal: Negative for myalgias and arthralgias.  Skin: Negative for color change and wound.  Neurological: Negative for dizziness and weakness.  Psychiatric/Behavioral: Negative for behavioral problems, confusion and agitation.    Past Medical History  Diagnosis Date  . Diabetes mellitus   . 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 Cardiomyopathy, ischemic Jan 2013   EF 35 to 40% [facility])  . Hepatic steatosis     by CT abd pelvis  . Osteoporosis, post-menopausal   . 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 Atherosclerotic peripheral vascular disease with gangrene [facility]) august 2012  . Hypertension   . Hyperlipidemia   . Sarcoidosis ([place])   . CHF (congestive heart failure) ([place])   . Renal insufficiency   . 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 [facility] (congestive cardiac failure) ([place]) 08/17/2013  . Pleural effusion 10/25/2012    10/2012 CT chest >> small to moderate R lung effusion>> chylothorax, 100% lymphs 10/2013 thoracentesis> cytology negative, WBC 1471, > 90% "small lymphs" 01/2014 CT chest> near complete resolution of pleural effusion, stable lymphadenopathy 08/2014 CT chest [place] ([facility]): small right sided effusion decreased in size, stable mediastinal lymphadenopathy 1.0cm largest, 74m . 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 Acute respiratory failure ([place] 11/21/2014  . Diabetic diarrhea ([place] 10/03/2014  . DM type 2, uncontrolled, with renal complications ([place] 83/01/5319 . Cerebral infarct ([place] 08/17/2013  . Arthritis 08/05/2013  . 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 Hyperlipidemia LDL goal <100 02/23/2014  . COPD (chronic obstructive pulmonary disease) ([place]  Past Surgical History  Procedure Laterality Date  . Abdominal hysterectomy      at ge 39. secondary to bleeding  . Toe amputation  Sept 2012    Right 2nd toe, [place]  . Ptca  August 2012    Right Posterior tibial artery , [place]  . Colonoscopy with propofol N/A 01/09/2015    Procedure: COLONOSCOPY WITH PROPOFOL;  Surgeon: Lucilla Lame,  MD;  Location: [facility] ENDOSCOPY;  Service: Endoscopy;  Laterality: N/A;  . Cholecystectomy    . Carpal tunnel release Bilateral   . Breast cyst aspiration Right   . Coronary angioplasty with stent placement      [place]; [facility]   Social History:   reports that she has never smoked. She has never used smokeless tobacco. She reports that she does not drink alcohol or use illicit drugs.  Family History  Problem Relation Age of Onset  . Heart disease Father   . Heart attack Father   . Heart disease Sister   . Heart attack Sister   . Heart disease Brother   . Heart attack Brother   . Asthma Grandchild   . Breast cancer Maternal Aunt     Medications: Patient's Medications  New Prescriptions   No medications on file  Previous Medications   ALBUTEROL (PROVENTIL) (2.5 MG/3ML) 0.083% NEBULIZER SOLUTION    Take 3 mLs (2.5 mg total) by nebulization every 6 (six) hours as needed for wheezing or shortness of breath.   ALENDRONATE (FOSAMAX) 70 MG TABLET    Take with a full glass of water on an empty stomach every Sunday  Take with a full glass of water and take no other meds or food for   30 minutes .   ALPRAZOLAM (XANAX) 0.5 MG TABLET    Take 1 tablet (0.5 mg total) by mouth daily as needed for sleep or anxiety.   ASPIRIN 81 MG EC TABLET    Take 1 tablet (81 mg total) by mouth daily. Swallow whole.   BENZONATATE (TESSALON) 200 MG CAPSULE    Take 200 mg by mouth 3 (three) times daily as needed for cough.   BLOOD GLUCOSE MONITORING SUPPL (ACCU-CHEK AVIVA) DEVICE    Use as instructed   BLOOD GLUCOSE MONITORING SUPPL (TRUERESULT BLOOD GLUCOSE) W/DEVICE KIT    1 kit by Does not apply route 3 (three) times daily.   BRILINTA 90 MG TABS TABLET    Take 1 tablet (90 mg total) by mouth 2 (two) times daily.   BUDESONIDE-FORMOTEROL (SYMBICORT) 160-4.5 MCG/ACT INHALER    Inhale 2 puffs into the lungs 2 (two) times daily.   COMBIVENT RESPIMAT 20-100 MCG/ACT AERS RESPIMAT    Inhale  1 puff into the lungs 3 (three) times daily as needed.   DOCUSATE SODIUM (COLACE) 100 MG CAPSULE    Take 100 mg by mouth daily as needed for mild constipation.   FLUTICASONE (FLONASE) 50 MCG/ACT NASAL SPRAY    Place 2 sprays into both nostrils daily.   FUROSEMIDE (LASIX) 20 MG TABLET    Take 1 tablet (20 mg total) by mouth daily.   GLUCOSE BLOOD (ACCU-CHEK AVIVA) TEST STRIP    Use up to three times a day.   INSULIN ASPART PROTAMINE - ASPART (NOVOLOG 70/30 MIX) (70-30) 100 UNIT/ML FLEXPEN    60 units before breakfast,  60 units before dinner.  NEEDS 15 PENS PER MONTH   INSULIN PEN NEEDLE (B-D UF III MINI PEN NEEDLES) 31G X 5 MM MISC  Use  Three times daily   ISOSORBIDE MONONITRATE (IMDUR) 30 MG 24 HR TABLET    Take 1 tablet (30 mg total) by mouth daily.   LANCET DEVICES (ACCU-CHEK SOFTCLIX) LANCETS    Utilize lancets three times a day to check blood glucose levels.   LOSARTAN (COZAAR) 100 MG TABLET    Take 1 tablet (100 mg total) by mouth daily.   METOPROLOL TARTRATE (LOPRESSOR) 12.5 MG TABS TABLET    Take 12.5 mg by mouth 2 (two) times daily. Hold for SBP <110 and HR <60/min   NITROGLYCERIN (NITROSTAT) 0.4 MG SL TABLET    Place 1 tablet (0.4 mg total) under the tongue every 5 (five) minutes as needed for chest pain.   PAROXETINE (PAXIL) 10 MG TABLET    Take 1 tablet (10 mg total) by mouth daily.   PREGABALIN (LYRICA) 100 MG CAPSULE    Take 1 capsule (100 mg total) by mouth 3 (three) times daily.   ROSUVASTATIN (CRESTOR) 20 MG TABLET    Take 1 tablet (20 mg total) by mouth daily.   SPACER/AERO-HOLDING CHAMBERS (AEROCHAMBER MV) INHALER    Use as instructed   TRAMADOL (ULTRAM) 50 MG TABLET    Take 1 tablet (50 mg total) by mouth every 6 (six) hours as needed for moderate pain.   TRUEPLUS LANCETS 30G MISC    USE TO CHECK GLUCOSE THREE TIMES DAILY   VITAMIN D, ERGOCALCIFEROL, (DRISDOL) 50000 UNITS CAPS CAPSULE    Take 50,000 Units by mouth every 7 (seven) days.  Modified Medications   No medications  on file  Discontinued Medications   BENZONATATE (TESSALON) 100 MG CAPSULE    Take 2 capsules (200 mg total) by mouth 3 (three) times daily as needed for cough.   LACTULOSE 20 GM/30ML SOLN    30 ml every 4 hours until constipation is relieved   METOPROLOL TARTRATE (LOPRESSOR) 25 MG TABLET    Take 0.5 tablets (12.5 mg total) by mouth 2 (two) times daily.     Physical Exam: Filed Vitals:   05/09/15 0958  BP: 134/49  Pulse: 61  Temp: 97.5 F (36.4 C)  TempSrc: Oral  Resp: 20  Weight: 200 lb 6.4 oz (90.901 kg)  SpO2: 96%    Physical Exam  Constitutional: She is oriented to person, place, and time. She appears well-developed and well-nourished. No distress.  HENT:  Head: Normocephalic and atraumatic.  Mouth/Throat: Oropharynx is clear and moist. No oropharyngeal exudate.  Eyes: Conjunctivae are normal. Pupils are equal, round, and reactive to light.  Neck: Normal range of motion. Neck supple.  Cardiovascular: Normal rate, regular rhythm and normal heart sounds.   Pulmonary/Chest: Effort normal and breath sounds normal.  Abdominal: Soft. Bowel sounds are normal.  Musculoskeletal: She exhibits no edema or tenderness.  Neurological: She is alert and oriented to person, place, and time.  Skin: Skin is warm and dry. She is not diaphoretic.  Psychiatric: She has a normal mood and affect.    Labs reviewed: Basic Metabolic Panel:  Recent Labs  04/19/15 1634  04/20/15 0628 04/21/15 0645 04/22/15 0541 04/25/15  NA 137  < > 140 140 138 139  K 4.3  < > 4.3 4.7 4.8 4.7  CL 100*  < > 108 108 107  --   CO2 28  < > _0 --   GLUCOSE 196*  < > 164* 163* 150*  --   BUN 37*  < > 47* 37* 31* 39*  CREATININE 2.04*  < >  2.16* 1.56* 1.33* 1.9*  CALCIUM 9.8  < > 9.1 9.5 9.7  --   MG 2.3  --   --   --   --   --   < > = values in this interval not displayed. Liver Function Tests:  Recent Labs  02/09/15 1646 03/05/15 0857 04/19/15 1634 04/25/15  AST _0 ALT _1 ALKPHOS 45 50 59 55  BILITOT 0.8 0.9 0.5  --   PROT 7.1 7.4 7.6  --   ALBUMIN 3.7 3.8 3.7  --     Recent Labs  04/19/15 1634  LIPASE 43   No results for input(s): AMMONIA in the last 8760 hours. CBC:  Recent Labs  02/09/15 1646 03/05/15 0857 04/19/15 1634 04/20/15 0628 04/22/15 0541 04/25/15  WBC 7.5 10.4 8.0 6.8 6.1 7.1  NEUTROABS 5.0 7.9* 5.8  --   --   --   HGB 11.0* 12.0 11.9* 10.2* 11.0* 10.8*  HCT 33.5* 36.2 34.8* 30.4* 32.2* 35*  MCV 88.6 88.2 88.5 88.3 88.2  --   PLT 259 274.0 312 242 252 291   TSH: No results for input(s): TSH in the last 8760 hours. A1C: Lab Results  Component Value Date   HGBA1C 7.5* 03/05/2015   Lipid Panel:  Recent Labs  10/03/14 1357 03/05/15 0857  CHOL  --  145  HDL  --  33.40*  LDLCALC  --  82  TRIG  --  152.0*  CHOLHDL  --  4  LDLDIRECT 73.0 87.0     Assessment/Plan 1. Acute on chronic kidney failure (HCC) BUN and Cr slightly elevated on last labs, pt eating and drinking well. Completed antibiotics Will need to have labs followed up with PCP next week to follow.   2. Uncontrolled type 2 diabetes mellitus with chronic kidney disease, with long-term current use of insulin, unspecified CKD stage (HCC) Blood sugars stable, will cont novolog 70/30 60 units BID  3. 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 Essential hypertension -stable on current regimen, conts on metoprolol, cozaar   5. Chronic diastolic CHF (congestive heart failure) (HCC) -CHF remains stable, pt with gradual weight gain since admission however no swelling, shortness of breath or JVD noted on exam, will cont lasix 20 mg daily at this time.   6. Arthritis -remains stable, mobic was stopped in hospital due to renal disease. Pt denies increase in pain.   pt is stable for discharge-will need PT/OT per home health. No DME needed. 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 No new medications were ordered.  will need to keep follow up with PCP next week.   Carlos American. Harle Battiest  [facility] [phone] 8 am - 5 pm) [phone] (after hours)

## 2015-05-10 ENCOUNTER — Telehealth: Payer: Self-pay

## 2015-05-10 ENCOUNTER — Other Ambulatory Visit: Payer: Medicare Other | Admitting: *Deleted

## 2015-05-10 ENCOUNTER — Telehealth: Payer: Self-pay | Admitting: Internal Medicine

## 2015-05-10 NOTE — Telephone Encounter (Signed)
Home from rehab having rectal bleeding. Left triage voice mail

## 2015-05-10 NOTE — Telephone Encounter (Signed)
Spoke with pt earlier this morning with transitional care management and she reported some ABD pain and vaginal bleeding upon waking.  She states, it was one episode and the pain and bleeding has stopped.  PCP made aware.  Appointment made and encouraged to go to the ER if condition worsens.

## 2015-05-10 NOTE — Telephone Encounter (Signed)
Transition Care Management Follow-up Telephone Call   Date discharged? 05/09/15   How have you been since you were released from the hospital? Woke with stomach pain and a small amount of bleeding from the vagina with loose stool.  Pain has subsided with no signs of continued bleeding or loose stool.  Denies nausea, vomiting, and dizziness.  Appetite is good and drinking plenty of water.   Do you understand why you were in the hospital? Yes, I was dizzy and had a fall.  I was sent to rehab but returned home yesterday.   Do you understand the discharge instructions? Yes, and I am checking my blood sugar and drinking plenty of water.   Where were you discharged to? Home   Items Reviewed:  Medications reviewed: Yes, stopped taking MELOXICAM and taking all other scheduled medications without issues.  Allergies reviewed: Yes, no changes.  Dietary changes reviewed:  Yes, diabetic diet, no problems.  Referrals reviewed: Yes, appointment made with PCP.  OT/PT Home health in progress.   Functional Questionnaire:   Activities of Daily Living (ADLs):   She states they are independent in the following: Bathing, dressing, meal prep. States they require assistance with the following: Ambulating (uses walker), husband to assist if needed.    Any transportation issues/concerns?: No   Any patient concerns? The stomach pain with bleeding this morning.     Confirmed importance and date/time of follow-up visits scheduled Yes, appointment made 05/16/14 at 0930.  Provider Appointment booked with Dr. Derrel Nip (PCP).  Confirmed with patient if condition begins to worsen call PCP or go to the ER.  Patient was given the office number and encouraged to call back with question or concerns.  : Yes, patient verbalized understanding.

## 2015-05-11 ENCOUNTER — Ambulatory Visit (INDEPENDENT_AMBULATORY_CARE_PROVIDER_SITE_OTHER): Payer: Medicare Other | Admitting: Family Medicine

## 2015-05-11 ENCOUNTER — Encounter: Payer: Self-pay | Admitting: Family Medicine

## 2015-05-11 DIAGNOSIS — N179 Acute kidney failure, unspecified: Secondary | ICD-10-CM

## 2015-05-11 DIAGNOSIS — R197 Diarrhea, unspecified: Secondary | ICD-10-CM | POA: Diagnosis not present

## 2015-05-11 DIAGNOSIS — A09 Infectious gastroenteritis and colitis, unspecified: Secondary | ICD-10-CM

## 2015-05-11 DIAGNOSIS — N189 Chronic kidney disease, unspecified: Secondary | ICD-10-CM

## 2015-05-11 DIAGNOSIS — N3001 Acute cystitis with hematuria: Secondary | ICD-10-CM

## 2015-05-11 LAB — CBC AND DIFFERENTIAL
HEMATOCRIT: 31 % — AB (ref 36–46)
Hemoglobin: 10.4 g/dL — AB (ref 12.0–16.0)
PLATELETS: 313 10*3/uL (ref 150–399)
WBC: 6.1 10*3/mL

## 2015-05-11 LAB — BASIC METABOLIC PANEL
BUN: 79 mg/dL — AB (ref 4–21)
CREATININE: 1.4 mg/dL — AB (ref 0.5–1.1)
POTASSIUM: 4.2 mmol/L (ref 3.4–5.3)
Sodium: 138 mmol/L (ref 137–147)

## 2015-05-11 LAB — CBC WITH DIFFERENTIAL/PLATELET
BASOS ABS: 0 10*3/uL (ref 0.0–0.1)
Basophils Relative: 0 % (ref 0–1)
Eosinophils Absolute: 0.2 10*3/uL (ref 0.0–0.7)
Eosinophils Relative: 3 % (ref 0–5)
HEMATOCRIT: 31.2 % — AB (ref 36.0–46.0)
Hemoglobin: 10.4 g/dL — ABNORMAL LOW (ref 12.0–15.0)
LYMPHS ABS: 1.3 10*3/uL (ref 0.7–4.0)
LYMPHS PCT: 22 % (ref 12–46)
MCH: 29.8 pg (ref 26.0–34.0)
MCHC: 33.3 g/dL (ref 30.0–36.0)
MCV: 89.4 fL (ref 78.0–100.0)
MONO ABS: 0.7 10*3/uL (ref 0.1–1.0)
MPV: 8.6 fL (ref 8.6–12.4)
Monocytes Relative: 12 % (ref 3–12)
NEUTROS ABS: 3.8 10*3/uL (ref 1.7–7.7)
Neutrophils Relative %: 63 % (ref 43–77)
PLATELETS: 313 10*3/uL (ref 150–400)
RBC: 3.49 MIL/uL — AB (ref 3.87–5.11)
RDW: 15.5 % (ref 11.5–15.5)
WBC: 6.1 10*3/uL (ref 4.0–10.5)

## 2015-05-11 LAB — COMPREHENSIVE METABOLIC PANEL
ALK PHOS: 56 U/L (ref 33–130)
ALT: 13 U/L (ref 6–29)
AST: 17 U/L (ref 10–35)
Albumin: 3.5 g/dL — ABNORMAL LOW (ref 3.6–5.1)
BILIRUBIN TOTAL: 0.6 mg/dL (ref 0.2–1.2)
BUN: 27 mg/dL — AB (ref 7–25)
CO2: 24 mmol/L (ref 20–31)
CREATININE: 1.42 mg/dL — AB (ref 0.60–0.93)
Calcium: 8.8 mg/dL (ref 8.6–10.4)
Chloride: 106 mmol/L (ref 98–110)
GLUCOSE: 79 mg/dL (ref 65–99)
Potassium: 4.2 mmol/L (ref 3.5–5.3)
SODIUM: 138 mmol/L (ref 135–146)
Total Protein: 6.6 g/dL (ref 6.1–8.1)

## 2015-05-11 LAB — HEPATIC FUNCTION PANEL
ALT: 7 U/L (ref 7–35)
AST: 17 U/L (ref 13–35)
Alkaline Phosphatase: 56 U/L (ref 25–125)
Bilirubin, Total: 56 mg/dL

## 2015-05-11 NOTE — Telephone Encounter (Signed)
Yes of course,  First available

## 2015-05-11 NOTE — Patient Instructions (Signed)
Continue your current medications.  Please bring the sample in the bottles back to the office.  We will call with your lab results.  If you worsen acutely, go to the hospital.  Take care  Dr. Lacinda Axon

## 2015-05-11 NOTE — Telephone Encounter (Signed)
Spoke with patient she will be seen at 3pm today by Dr. Lacinda Axon

## 2015-05-11 NOTE — Telephone Encounter (Signed)
Spoke with the patient,   Patient has been having diarrhea since yesterday , stopped as of now but had it throughout the night.  No vaginal or rectal bleeding per patient. No abdominal pains now either.  States that she is having  back pains, rates a 6 on a scale of 0-10.  Believes it is because she was up and down all night.  Asked her about her intake, she hasn't eaten or drank anything today.  Encouraged fluids and if she tolerates.  Patient wants to know if she can be seen earlier.  I explained that it may be with a different provider and she was okay with that.  Please advise?

## 2015-05-11 NOTE — Progress Notes (Signed)
Pre visit review using our clinic review tool, if applicable. No additional management support is needed unless otherwise documented below in the visit note. 

## 2015-05-15 DIAGNOSIS — R197 Diarrhea, unspecified: Secondary | ICD-10-CM | POA: Insufficient documentation

## 2015-05-15 NOTE — Progress Notes (Signed)
Subjective:  Patient ID: Pamela Collier, female    DOB: 09/19/1940  Age: 75 y.o. MRN: [id number]  CC: Hospital & post rehab follow up; Diarrhea  HPI:  75 year old female with a complicated past medical history including sarcoidosis, peripheral vascular disease, DM 2 with complications, chronic diastolic heart failure, history of CVA, hypertension, hyperlipidemia, CKD presents for posthospitalization follow-up. She also has a complaint of diarrhea today.  Patient was admitted on 12/8 and was discharged on 12/11. Her hospital course and nursing home course was reviewed in detail and is summarized as below.  Patient was admitted after suffering a fall. She was found to have acute on chronic renal failure at that time. She was treated with IV fluids. Additionally, she was found to have urinary tract infection (ESBL) and was discharged to a nursing facility on ertapenem. Patient's creatinine improved at the time of discharge and she was discharged to a nursing facility for rehabilitation given her comorbidities and and weakness. Patient did well at the nursing facility and tolerated therapy well. She was discharged home in stable condition with plans for home health PT and OT.  In addition to the above, the patient has an acute complaint today. She states that she's been expanding diarrhea since 5 PM on 12/29. She states that the diarrhea is very loose and watery and foul-smelling. She denies any associated abdominal pain. She has been increasing her fluid intake. She's been able to keep food down as well. She states that the diarrhea has now resolved. No associated fevers or chills.  Social Hx   Social History   Social History  . Marital Status: Married    Spouse Name: N/A  . Number of Children: N/A  . Years of Education: N/A   Social History Main Topics  . Smoking status: Never Smoker   . Smokeless tobacco: Never Used  . Alcohol Use: No  . Drug Use: No  . Sexual Activity: Yes   Other  Topics Concern  . None   Social History Narrative   Review of Systems  Constitutional: Negative.   Gastrointestinal: Positive for diarrhea. Negative for abdominal pain.   Objective:  BP 102/52 mmHg  Pulse 57  Temp(Src) 97.9 F (36.6 C) (Oral)  Ht 5' 4"  (1.626 m)  Wt 195 lb 8 oz (88.678 kg)  BMI 33.54 kg/m2  SpO2 98%  BP/Weight 05/11/2015 05/09/2015 42/35/3614  Systolic BP 431 540 086  Diastolic BP 52 49 54  Wt. (Lbs) 195.5 200.4 193.6  BMI 33.54 32.36 31.26   Physical Exam  Constitutional:  Chronically ill-appearing female in no acute distress.  HENT:  Head: Normocephalic and atraumatic.  Eyes: Conjunctivae are normal. No scleral icterus.  Cardiovascular: Regular rhythm.  Bradycardia present.   Pulmonary/Chest: Effort normal and breath sounds normal. No respiratory distress. She has no wheezes. She has no rales.  Abdominal: Soft. She exhibits no distension. There is no tenderness. There is no rebound and no guarding.  Neurological: She is alert.  Skin: Skin is warm and dry. No rash noted.  Psychiatric: She has a normal mood and affect.  Vitals reviewed.  Lab Results  Component Value Date   WBC 6.1 05/11/2015   HGB 10.4* 05/11/2015   HCT 31.2* 05/11/2015   PLT 313 05/11/2015   GLUCOSE 79 05/11/2015   CHOL 145 03/05/2015   TRIG 152.0* 03/05/2015   HDL 33.40* 03/05/2015   LDLDIRECT 87.0 03/05/2015   LDLCALC 82 03/05/2015   ALT 13 05/11/2015   AST 17 05/11/2015  NA 138 05/11/2015   K 4.2 05/11/2015   CL 106 05/11/2015   CREATININE 1.42* 05/11/2015   BUN 27* 05/11/2015   CO2 24 05/11/2015   TSH 1.16 07/01/2013   INR 1.2* 02/02/2015   HGBA1C 7.5* 03/05/2015   MICROALBUR 11.0* 03/05/2015   Assessment & Plan:   Problem List Items Addressed This Visit    UTI (urinary tract infection)    Resolved. Patient completed course of antibiotics.      Diarrhea    Concern for C. difficile given recent admission. However, diarrhea is now resolved. Will send  patient home with acute for collection. As this is a holiday weekend, I cautioned her that if it returns/worsens she needs to go to the hospital.      Relevant Orders   Clostridium Difficile by PCR   CBC w/Diff (Completed)   Comp Met (CMET) (Completed)   Acute on chronic kidney failure [facility])    Patient did well during hospitalization. Labs obtained today to recheck creatinine especially in the setting of diarrhea. Creatinine was stable at 1.4 (at baseline). Patient to continue current medications.         Follow-up: As previously scheduled with PCP  [place]

## 2015-05-15 NOTE — Assessment & Plan Note (Addendum)
Resolved. Patient completed course of antibiotics.

## 2015-05-15 NOTE — Assessment & Plan Note (Signed)
Concern for C. difficile given recent admission. However, diarrhea is now resolved. Will send patient home with acute for collection. As this is a holiday weekend, I cautioned her that if it returns/worsens she needs to go to the hospital.

## 2015-05-15 NOTE — Assessment & Plan Note (Signed)
Patient did well during hospitalization. Labs obtained today to recheck creatinine especially in the setting of diarrhea. Creatinine was stable at 1.4 (at baseline). Patient to continue current medications.

## 2015-05-16 ENCOUNTER — Other Ambulatory Visit: Payer: Self-pay | Admitting: *Deleted

## 2015-05-16 DIAGNOSIS — F341 Dysthymic disorder: Secondary | ICD-10-CM | POA: Diagnosis not present

## 2015-05-16 DIAGNOSIS — M1990 Unspecified osteoarthritis, unspecified site: Secondary | ICD-10-CM | POA: Diagnosis not present

## 2015-05-16 DIAGNOSIS — E785 Hyperlipidemia, unspecified: Secondary | ICD-10-CM | POA: Diagnosis not present

## 2015-05-16 DIAGNOSIS — Z87440 Personal history of urinary (tract) infections: Secondary | ICD-10-CM | POA: Diagnosis not present

## 2015-05-16 DIAGNOSIS — I739 Peripheral vascular disease, unspecified: Secondary | ICD-10-CM | POA: Diagnosis not present

## 2015-05-16 DIAGNOSIS — Z794 Long term (current) use of insulin: Secondary | ICD-10-CM | POA: Diagnosis not present

## 2015-05-16 DIAGNOSIS — Z9181 History of falling: Secondary | ICD-10-CM | POA: Diagnosis not present

## 2015-05-16 DIAGNOSIS — I5032 Chronic diastolic (congestive) heart failure: Secondary | ICD-10-CM | POA: Diagnosis not present

## 2015-05-16 DIAGNOSIS — E1165 Type 2 diabetes mellitus with hyperglycemia: Secondary | ICD-10-CM | POA: Diagnosis not present

## 2015-05-16 DIAGNOSIS — E1121 Type 2 diabetes mellitus with diabetic nephropathy: Secondary | ICD-10-CM | POA: Diagnosis not present

## 2015-05-16 DIAGNOSIS — M810 Age-related osteoporosis without current pathological fracture: Secondary | ICD-10-CM | POA: Diagnosis not present

## 2015-05-16 DIAGNOSIS — J449 Chronic obstructive pulmonary disease, unspecified: Secondary | ICD-10-CM | POA: Diagnosis not present

## 2015-05-16 DIAGNOSIS — I255 Ischemic cardiomyopathy: Secondary | ICD-10-CM | POA: Diagnosis not present

## 2015-05-16 NOTE — Patient Outreach (Signed)
Unsuccessful outreach for [facility] outreach for transition of care. HIPAA compliant message left.   Plan: RNCM will await return call  RNCM will attempt outreach again in 2 days.  Rutherford Limerick RN, BSN  [facility] Care Management [phone])

## 2015-05-17 ENCOUNTER — Ambulatory Visit (INDEPENDENT_AMBULATORY_CARE_PROVIDER_SITE_OTHER): Payer: Medicare Other | Admitting: Internal Medicine

## 2015-05-17 ENCOUNTER — Encounter: Payer: Self-pay | Admitting: Internal Medicine

## 2015-05-17 VITALS — BP 158/60 | HR 54 | Temp 97.9°F | Resp 12 | Ht 64.0 in | Wt 192.8 lb

## 2015-05-17 DIAGNOSIS — E1169 Type 2 diabetes mellitus with other specified complication: Secondary | ICD-10-CM | POA: Diagnosis not present

## 2015-05-17 DIAGNOSIS — Z794 Long term (current) use of insulin: Secondary | ICD-10-CM | POA: Diagnosis not present

## 2015-05-17 DIAGNOSIS — I255 Ischemic cardiomyopathy: Secondary | ICD-10-CM

## 2015-05-17 DIAGNOSIS — E559 Vitamin D deficiency, unspecified: Secondary | ICD-10-CM

## 2015-05-17 DIAGNOSIS — E785 Hyperlipidemia, unspecified: Secondary | ICD-10-CM

## 2015-05-17 DIAGNOSIS — E1165 Type 2 diabetes mellitus with hyperglycemia: Secondary | ICD-10-CM | POA: Diagnosis not present

## 2015-05-17 DIAGNOSIS — E1121 Type 2 diabetes mellitus with diabetic nephropathy: Secondary | ICD-10-CM

## 2015-05-17 DIAGNOSIS — IMO0002 Reserved for concepts with insufficient information to code with codable children: Secondary | ICD-10-CM

## 2015-05-17 DIAGNOSIS — I5032 Chronic diastolic (congestive) heart failure: Secondary | ICD-10-CM | POA: Diagnosis not present

## 2015-05-17 DIAGNOSIS — A09 Infectious gastroenteritis and colitis, unspecified: Secondary | ICD-10-CM

## 2015-05-17 DIAGNOSIS — R197 Diarrhea, unspecified: Secondary | ICD-10-CM

## 2015-05-17 MED ORDER — ERGOCALCIFEROL 1.25 MG (50000 UT) PO CAPS: 50000.0000 [IU] | ORAL_CAPSULE | ORAL | Status: DC

## 2015-05-17 NOTE — Progress Notes (Signed)
Pre-visit discussion using our clinic review tool. No additional management support is needed unless otherwise documented below in the visit note.  

## 2015-05-17 NOTE — Progress Notes (Signed)
Subjective:  Patient ID: [name], female    DOB: [birth year]  Age: 75 y.o. MRN: [id number]  CC: The primary encounter diagnosis was Uncontrolled type 2 diabetes mellitus with diabetic nephropathy, with long-term current use of insulin ([place]). Diagnoses of Chronic diastolic heart failure ([place]), Vitamin D deficiency, Hyperlipidemia associated with type 2 diabetes mellitus ([place]), Diarrhea of presumed infectious origin, and Cardiomyopathy, ischemic were also pertinent to this visit.  HPI HANNA RA presents for follow up on hematuria and watery  diarrhea associated with antibiotic use (imipenem prescribed by [facility] for UTI x 10 days  at dc to [facility], started Dec 11) ,  .  Admitted after left leg gave out and she fell.   Had been feeling weak for 3-4 f DAYS.  Was evaluated last week, did not return stool sample as directed,  Has brought it with her today .  States the was told by dr cook's nurse that she could bring it with her to her appointment.  Patient admits that she has been confused about a lot of things and her husband makes her nervous because he complains.   The diarrhea has resolved. Stools are now solid.  She has not had any more hematuria either.   HAS LOST 3 LBS SINCE DEC 30TH,  HAS not taken lasix since leaving [facility] Dec 28th   .  Not short of breath.  Not retaining  fluid   Weighs herself  daily and takes lasix  if wt gain 3 lbs overnight   Blood sugars have improved  Since the holiday and birthday celebrations have stopped.  No lows.    Lab Results  Component Value Date   HGBA1C 7.5* 03/05/2015      Outpatient Prescriptions Prior to Visit  Medication Sig Dispense Refill  . alendronate (FOSAMAX) 70 MG tablet Take with a full glass of water on an empty stomach every Sunday  Take with a full glass of water and take no other meds or food for   30 minutes . 12 tablet 1  . ALPRAZolam (XANAX) 0.5 MG tablet Take 1 tablet (0.5 mg total) by mouth daily as needed for  sleep or anxiety. 90 tablet 0  . aspirin 81 MG EC tablet Take 1 tablet (81 mg total) by mouth daily. Swallow whole. 30 tablet 12  . benzonatate (TESSALON) 200 MG capsule Take 200 mg by mouth 3 (three) times daily as needed for cough. Reported on 05/17/2015    . Blood Glucose Monitoring Suppl (ACCU-CHEK AVIVA) device Use as instructed 1 each 0  . Blood Glucose Monitoring Suppl (TRUERESULT BLOOD GLUCOSE) W/DEVICE KIT 1 kit by Does not apply route 3 (three) times daily. 1 each 0  . BRILINTA 90 MG TABS tablet Take 1 tablet (90 mg total) by mouth 2 (two) times daily. 180 tablet 2  . budesonide-formoterol (SYMBICORT) 160-4.5 MCG/ACT inhaler Inhale 2 puffs into the lungs 2 (two) times daily. 1 Inhaler 0  . COMBIVENT RESPIMAT 20-100 MCG/ACT AERS respimat Inhale 1 puff into the lungs 3 (three) times daily as needed. (Patient taking differently: Inhale 1 puff into the lungs 3 (three) times daily as needed for wheezing or shortness of breath. ) 2 Inhaler 3  . docusate sodium (COLACE) 100 MG capsule Take 100 mg by mouth daily as needed for mild constipation.    . fluticasone (FLONASE) 50 MCG/ACT nasal spray Place 2 sprays into both nostrils daily. 16 g 2  . glucose blood (ACCU-CHEK AVIVA) test strip  Use up to three times a day. 100 each 11  . insulin aspart protamine - aspart (NOVOLOG 70/30 MIX) (70-30) 100 UNIT/ML FlexPen 60 units before breakfast,  60 units before dinner.  NEEDS 15 PENS PER MONTH 105 mL 2  . Insulin Pen Needle (B-D UF III MINI PEN NEEDLES) 31G X 5 MM MISC Use  Three times daily 300 each 3  . isosorbide mononitrate (IMDUR) 30 MG 24 hr tablet Take 1 tablet (30 mg total) by mouth daily. 90 tablet 1  . Lancet Devices (ACCU-CHEK SOFTCLIX) lancets Utilize lancets three times a day to check blood glucose levels. 1 each 11  . losartan (COZAAR) 100 MG tablet Take 1 tablet (100 mg total) by mouth daily. 90 tablet 2  . metoprolol tartrate (LOPRESSOR) 12.5 mg TABS tablet Take 12.5 mg by mouth 2 (two) times  daily. Hold for SBP <110 and HR <60/min    . nitroGLYCERIN (NITROSTAT) 0.4 MG SL tablet Place 1 tablet (0.4 mg total) under the tongue every 5 (five) minutes as needed for chest pain. 25 tablet 3  . PARoxetine (PAXIL) 10 MG tablet Take 1 tablet (10 mg total) by mouth daily. 90 tablet 1  . pregabalin (LYRICA) 100 MG capsule Take 1 capsule (100 mg total) by mouth 3 (three) times daily. 270 capsule 3  . rosuvastatin (CRESTOR) 20 MG tablet Take 1 tablet (20 mg total) by mouth daily. (Patient taking differently: Take 20 mg by mouth at bedtime. ) 90 tablet 1  . Spacer/Aero-Holding Chambers (AEROCHAMBER MV) inhaler Use as instructed 1 each 0  . traMADol (ULTRAM) 50 MG tablet Take 1 tablet (50 mg total) by mouth every 6 (six) hours as needed for moderate pain. 120 tablet 3  . TRUEPLUS LANCETS 30G MISC USE TO CHECK GLUCOSE THREE TIMES DAILY 400 each 0  . albuterol (PROVENTIL) (2.5 MG/3ML) 0.083% nebulizer solution Take 3 mLs (2.5 mg total) by nebulization every 6 (six) hours as needed for wheezing or shortness of breath. 150 mL 1  . furosemide (LASIX) 20 MG tablet Take 1 tablet (20 mg total) by mouth daily. (Patient not taking: Reported on 05/17/2015) 90 tablet 1  . Vitamin D, Ergocalciferol, (DRISDOL) 50000 UNITS CAPS capsule Take 50,000 Units by mouth every 7 (seven) days. Reported on 05/17/2015     Facility-Administered Medications Prior to Visit  Medication Dose Route Frequency Provider Last Rate Last Dose  . albuterol (PROVENTIL) (5 MG/ML) 0.5% nebulizer solution 2.5 mg  2.5 mg Nebulization Once [name], MD        Review of Systems;  Patient denies headache, fevers, malaise, unintentional weight loss, skin rash, eye pain, sinus congestion and sinus pain, sore throat, dysphagia,  hemoptysis , cough, dyspnea, wheezing, chest pain, palpitations, orthopnea, edema, abdominal pain, nausea, melena, diarrhea, constipation, flank pain, dysuria, hematuria, urinary  Frequency, nocturia, numbness, tingling,  seizures,  Focal weakness, Loss of consciousness,  Tremor, insomnia, depression, anxiety, and suicidal ideation.      Objective:  BP 158/60 mmHg  Pulse 54  Temp(Src) 97.9 F (36.6 C) (Oral)  Resp 12  Ht 5' 4"  (1.626 m)  Wt 192 lb 12 oz (87.431 kg)  BMI 33.07 kg/m2  SpO2 97%  BP Readings from Last 3 Encounters:  05/17/15 158/60  05/11/15 102/52  05/09/15 134/49    Wt Readings from Last 3 Encounters:  05/17/15 192 lb 12 oz (87.431 kg)  05/11/15 195 lb 8 oz (88.678 kg)  05/09/15 200 lb 6.4 oz (90.901 kg)  General appearance: alert, cooperative and appears stated age Ears: normal TM's and external ear canals both ears Throat: lips, mucosa, and tongue normal; teeth and gums normal Neck: no adenopathy, no carotid bruit, supple, symmetrical, trachea midline and thyroid not enlarged, symmetric, no tenderness/mass/nodules Back: symmetric, no curvature. ROM normal. No CVA tenderness. Lungs: clear to auscultation bilaterally Heart: regular rate and rhythm, S1, S2 normal, no murmur, click, rub or gallop Abdomen: soft, non-tender; bowel sounds normal; no masses,  no organomegaly Pulses: 2+ and symmetric Skin: Skin color, texture, turgor normal. No rashes or lesions Lymph nodes: Cervical, supraclavicular, and axillary nodes normal.  Lab Results  Component Value Date   HGBA1C 7.5* 03/05/2015   HGBA1C 7.4* 02/09/2015   HGBA1C 8.1* 10/03/2014    Lab Results  Component Value Date   CREATININE 1.42* 05/11/2015   CREATININE 1.9* 04/25/2015   CREATININE 1.33* 04/22/2015    Lab Results  Component Value Date   WBC 6.1 05/11/2015   HGB 10.4* 05/11/2015   HCT 31.2* 05/11/2015   PLT 313 05/11/2015   GLUCOSE 79 05/11/2015   CHOL 145 03/05/2015   TRIG 152.0* 03/05/2015   HDL 33.40* 03/05/2015   LDLDIRECT 87.0 03/05/2015   LDLCALC 82 03/05/2015   ALT 13 05/11/2015   AST 17 05/11/2015   NA 138 05/11/2015   K 4.2 05/11/2015   CL 106 05/11/2015   CREATININE 1.42* 05/11/2015    BUN 27* 05/11/2015   CO2 24 05/11/2015   TSH 1.16 07/01/2013   INR 1.2* 02/02/2015   HGBA1C 7.5* 03/05/2015   MICROALBUR 11.0* 03/05/2015    No results found.  Assessment & Plan:   Problem List Items Addressed This Visit    DM (diabetes mellitus), type 2, uncontrolled, with renal complications ([place]) - Primary    She has no feeling in her feet.  No ulcers are seen today.  callouses are noted.  Continue podiatry follow up . Improved control .  No changes today .   Lab Results  Component Value Date   HGBA1C 7.5* 03/05/2015   Lab Results  Component Value Date   MICROALBUR 11.0* 03/05/2015   Lab Results  Component Value Date   CHOL 145 03/05/2015   HDL 33.40* 03/05/2015   LDLCALC 82 03/05/2015   LDLDIRECT 87.0 03/05/2015   TRIG 152.0* 03/05/2015   CHOLHDL 4 03/05/2015              Relevant Orders   Comprehensive metabolic panel   Hemoglobin A1c   Microalbumin / creatinine urine ratio   Cardiomyopathy, ischemic    She has been weighing herself daily and weight has been stable without lasix.       Diarrhea    Resolved spontaneously.  No testing wsa done due to patient not returning samples in a timely fashion       Other Visit Diagnoses    Chronic diastolic heart failure ([place])        Vitamin D deficiency        Relevant Orders    VITAMIN D 25 Hydroxy (Vit-D Deficiency, Fractures)    Hyperlipidemia associated with type 2 diabetes mellitus ([place])        Relevant Orders    Lipid panel    LDL cholesterol, direct       I am having Ms. Goshorn start on ergocalciferol. I am also having her maintain her aspirin, TRUERESULT BLOOD GLUCOSE, TRUEPLUS LANCETS 30G, Vitamin D (Ergocalciferol), alendronate, ALPRAZolam, furosemide, Insulin Pen Needle, isosorbide mononitrate, losartan, rosuvastatin, traMADol, insulin  aspart protamine - aspart, BRILINTA, fluticasone, pregabalin, PARoxetine, COMBIVENT RESPIMAT, albuterol, AEROCHAMBER MV, budesonide-formoterol, ACCU-CHEK AVIVA,  glucose blood, accu-chek softclix, nitroGLYCERIN, docusate sodium, metoprolol tartrate, and benzonatate.  Meds ordered this encounter  Medications  . ergocalciferol (DRISDOL) 50000 units capsule    Sig: Take 1 capsule (50,000 Units total) by mouth once a week. With Sunday dinner    Dispense:  4 capsule    Refill:  0    There are no discontinued medications.  Follow-up: No Follow-up on file.   [name], MD

## 2015-05-17 NOTE — Patient Instructions (Addendum)
Please return in late January for fasting labs  If your morning blood sugar drops below 90,  please call so I can lower your insulin dose  You do not need to take Lasix unless you gain 2 lbs of weight OVERNIGHT  I will see you again in April , sooner if you have any problems

## 2015-05-18 ENCOUNTER — Other Ambulatory Visit: Payer: Self-pay | Admitting: *Deleted

## 2015-05-18 NOTE — Patient Outreach (Signed)
RNCM called pt as part of transition of care program. [facility] services explained and consent received verbally from pt. Transition of care flow-sheet completed. RNCM made an appointment to make a home visit with pt. Pt stated she was doing well and had been seen by her primary care MD already.     Plan: RNCM will visit pt next week as continued part of the transition of care program.  Rutherford Limerick RN, BSN  [facility] [phone]

## 2015-05-20 NOTE — Assessment & Plan Note (Signed)
She has no feeling in her feet.  No ulcers are seen today.  callouses are noted.  Continue podiatry follow up . Improved control .  No changes today .   Lab Results  Component Value Date   HGBA1C 7.5* 03/05/2015   Lab Results  Component Value Date   MICROALBUR 11.0* 03/05/2015   Lab Results  Component Value Date   CHOL 145 03/05/2015   HDL 33.40* 03/05/2015   LDLCALC 82 03/05/2015   LDLDIRECT 87.0 03/05/2015   TRIG 152.0* 03/05/2015   CHOLHDL 4 03/05/2015

## 2015-05-20 NOTE — Assessment & Plan Note (Signed)
Resolved spontaneously.  No testing wsa done due to patient not returning samples in a timely fashion

## 2015-05-20 NOTE — Assessment & Plan Note (Signed)
She has been weighing herself daily and weight has been stable without lasix.

## 2015-05-22 DIAGNOSIS — E1165 Type 2 diabetes mellitus with hyperglycemia: Secondary | ICD-10-CM | POA: Diagnosis not present

## 2015-05-22 DIAGNOSIS — E1121 Type 2 diabetes mellitus with diabetic nephropathy: Secondary | ICD-10-CM | POA: Diagnosis not present

## 2015-05-22 DIAGNOSIS — I5032 Chronic diastolic (congestive) heart failure: Secondary | ICD-10-CM | POA: Diagnosis not present

## 2015-05-22 DIAGNOSIS — I255 Ischemic cardiomyopathy: Secondary | ICD-10-CM | POA: Diagnosis not present

## 2015-05-22 DIAGNOSIS — J449 Chronic obstructive pulmonary disease, unspecified: Secondary | ICD-10-CM | POA: Diagnosis not present

## 2015-05-22 DIAGNOSIS — M810 Age-related osteoporosis without current pathological fracture: Secondary | ICD-10-CM | POA: Diagnosis not present

## 2015-05-23 ENCOUNTER — Other Ambulatory Visit: Payer: Self-pay | Admitting: Internal Medicine

## 2015-05-23 ENCOUNTER — Encounter: Payer: Self-pay | Admitting: Internal Medicine

## 2015-05-24 DIAGNOSIS — I5032 Chronic diastolic (congestive) heart failure: Secondary | ICD-10-CM | POA: Diagnosis not present

## 2015-05-24 DIAGNOSIS — E1121 Type 2 diabetes mellitus with diabetic nephropathy: Secondary | ICD-10-CM | POA: Diagnosis not present

## 2015-05-24 DIAGNOSIS — E1165 Type 2 diabetes mellitus with hyperglycemia: Secondary | ICD-10-CM | POA: Diagnosis not present

## 2015-05-24 DIAGNOSIS — M810 Age-related osteoporosis without current pathological fracture: Secondary | ICD-10-CM | POA: Diagnosis not present

## 2015-05-24 DIAGNOSIS — J449 Chronic obstructive pulmonary disease, unspecified: Secondary | ICD-10-CM | POA: Diagnosis not present

## 2015-05-24 DIAGNOSIS — I255 Ischemic cardiomyopathy: Secondary | ICD-10-CM | POA: Diagnosis not present

## 2015-05-25 ENCOUNTER — Other Ambulatory Visit: Payer: Self-pay | Admitting: *Deleted

## 2015-05-25 ENCOUNTER — Other Ambulatory Visit: Payer: Self-pay | Admitting: Internal Medicine

## 2015-05-25 ENCOUNTER — Telehealth: Payer: Self-pay | Admitting: Internal Medicine

## 2015-05-25 MED ORDER — BENZONATATE 200 MG PO CAPS: 200.0000 mg | ORAL_CAPSULE | Freq: Three times a day (TID) | ORAL | Status: DC | PRN

## 2015-05-25 NOTE — Telephone Encounter (Signed)
Skip this week's dose and resume next week

## 2015-05-25 NOTE — Telephone Encounter (Signed)
Pamela Collier, Patient lvm on my line stating that Dr. Derrel Nip gave her some vitamins to take once a week. She took them on Monday and forgot that she did and took another one the next day. She said that the pharmacy said to call us to see if she needs to skip taking her vitamin this Monday or if she needs to continue on her weekly routine.

## 2015-05-25 NOTE — Telephone Encounter (Signed)
Patient notified

## 2015-05-25 NOTE — Telephone Encounter (Signed)
Patient took 2 doses of drisdol in one week does she ned to skip dose this coming Monday? Patient one on 03/20/16 and the second one on 03/21/16 next dose due 03/27/16?

## 2015-05-25 NOTE — Telephone Encounter (Signed)
Please advise refill, thanks 

## 2015-05-25 NOTE — Patient Outreach (Signed)
Pamela Collier) Care Management   05/25/2015  Pamela Collier 1941-03-01 [id number]  Pamela Collier is an 75 y.o. female  Subjective: "I am feeling better." "I am trying to be really careful not to fall."   Objective: Blood pressure 138/76, pulse 68, resp. rate 18, height 1.626 m (5' 4" ), weight 190 lb (86.183 kg), SpO2 98 % on room air.   Review of Systems  HENT:       Having headaches when laying down. 2 tylenol will relieve the headache.   Respiratory: Positive for cough. Negative for sputum production and shortness of breath.   Musculoskeletal: Positive for falls.       Fall in December that sent pt to ED  Neurological: Positive for weakness and headaches.    Physical Exam  Constitutional: She is oriented to person, place, and time. She appears well-nourished.  Cardiovascular: Normal rate and regular rhythm.   Pulses:      Radial pulses are 2+ on the right side, and 2+ on the left side.       Dorsalis pedis pulses are 1+ on the right side, and 1+ on the left side.  Respiratory: Effort normal and breath sounds normal.  GI: Soft. Bowel sounds are normal.  Musculoskeletal: Normal range of motion.  Neurological: She is alert and oriented to person, place, and time.  Skin: Skin is warm, dry and intact.  Skin to bilateral feet intact.   Psychiatric: She has a normal mood and affect. Her speech is normal and behavior is normal. Judgment and thought content normal. Cognition and memory are normal.    Current Medications:   Current Outpatient Prescriptions  Medication Sig Dispense Refill  . albuterol (PROVENTIL) (2.5 MG/3ML) 0.083% nebulizer solution Take 3 mLs (2.5 mg total) by nebulization every 6 (six) hours as needed for wheezing or shortness of breath. 150 mL 1  . alendronate (FOSAMAX) 70 MG tablet Take with a full glass of water on an empty stomach every Sunday  Take with a full glass of water and take no other meds or food for   30 minutes . 12 tablet 1  .  ALPRAZolam (XANAX) 0.5 MG tablet Take 1 tablet (0.5 mg total) by mouth daily as needed for sleep or anxiety. 90 tablet 0  . aspirin 81 MG EC tablet Take 1 tablet (81 mg total) by mouth daily. Swallow whole. 30 tablet 12  . benzonatate (TESSALON) 200 MG capsule Take 200 mg by mouth 3 (three) times daily as needed for cough. Reported on 05/17/2015    . Blood Glucose Monitoring Suppl (ACCU-CHEK AVIVA) device Use as instructed 1 each 0  . Blood Glucose Monitoring Suppl (TRUERESULT BLOOD GLUCOSE) W/DEVICE KIT 1 kit by Does not apply route 3 (three) times daily. 1 each 0  . BRILINTA 90 MG TABS tablet Take 1 tablet (90 mg total) by mouth 2 (two) times daily. 180 tablet 2  . budesonide-formoterol (SYMBICORT) 160-4.5 MCG/ACT inhaler Inhale 2 puffs into the lungs 2 (two) times daily. 1 Inhaler 0  . COMBIVENT RESPIMAT 20-100 MCG/ACT AERS respimat Inhale 1 puff into the lungs 3 (three) times daily as needed. (Patient taking differently: Inhale 1 puff into the lungs 3 (three) times daily as needed for wheezing or shortness of breath. ) 2 Inhaler 3  . ergocalciferol (DRISDOL) 50000 units capsule Take 1 capsule (50,000 Units total) by mouth once a week. With Sunday dinner 4 capsule 0  . fluticasone (FLONASE) 50 MCG/ACT nasal spray Place 2 sprays  into both nostrils daily. 16 g 2  . glucose blood (ACCU-CHEK AVIVA) test strip Use up to three times a day. 100 each 11  . insulin aspart protamine - aspart (NOVOLOG 70/30 MIX) (70-30) 100 UNIT/ML FlexPen 60 units before breakfast,  60 units before dinner.  NEEDS 15 PENS PER MONTH 105 mL 2  . Insulin Pen Needle (B-D UF III MINI PEN NEEDLES) 31G X 5 MM MISC Use  Three times daily 300 each 3  . isosorbide mononitrate (IMDUR) 30 MG 24 hr tablet Take 1 tablet (30 mg total) by mouth daily. 90 tablet 1  . Lancet Devices (ACCU-CHEK SOFTCLIX) lancets Utilize lancets three times a day to check blood glucose levels. 1 each 11  . losartan (COZAAR) 100 MG tablet Take 1 tablet (100 mg  total) by mouth daily. 90 tablet 2  . LYRICA 100 MG capsule TAKE ONE CAPSULE BY MOUTH THREE TIMES DAILY 270 capsule 0  . metoprolol tartrate (LOPRESSOR) 12.5 mg TABS tablet Take 12.5 mg by mouth 2 (two) times daily. Hold for SBP <110 and HR <60/min    . nitroGLYCERIN (NITROSTAT) 0.4 MG SL tablet Place 1 tablet (0.4 mg total) under the tongue every 5 (five) minutes as needed for chest pain. 25 tablet 3  . PARoxetine (PAXIL) 10 MG tablet Take 1 tablet (10 mg total) by mouth daily. 90 tablet 1  . rosuvastatin (CRESTOR) 20 MG tablet Take 1 tablet (20 mg total) by mouth daily. (Patient taking differently: Take 20 mg by mouth at bedtime. ) 90 tablet 1  . Spacer/Aero-Holding Chambers (AEROCHAMBER MV) inhaler Use as instructed 1 each 0  . docusate sodium (COLACE) 100 MG capsule Take 100 mg by mouth daily as needed for mild constipation. Reported on 05/25/2015    . furosemide (LASIX) 20 MG tablet Take 1 tablet (20 mg total) by mouth daily. (Patient not taking: Reported on 05/17/2015) 90 tablet 1  . traMADol (ULTRAM) 50 MG tablet Take 1 tablet (50 mg total) by mouth every 6 (six) hours as needed for moderate pain. (Patient not taking: Reported on 05/25/2015) 120 tablet 3  . Vitamin D, Ergocalciferol, (DRISDOL) 50000 UNITS CAPS capsule Take 50,000 Units by mouth every 7 (seven) days. Reported on 05/25/2015     No current facility-administered medications for this visit.   Facility-Administered Medications Ordered in Other Visits  Medication Dose Route Frequency Provider Last Rate Last Dose  . albuterol (PROVENTIL) (5 MG/ML) 0.5% nebulizer solution 2.5 mg  2.5 mg Nebulization Once [name], MD        Functional Status:   In your present state of health, do you have any difficulty performing the following activities: 04/20/2015 10/20/2014  Hearing? N N  Vision? N N  Difficulty concentrating or making decisions? Y N  Walking or climbing stairs? Y Y  Dressing or bathing? Y N  Doing errands, shopping? Y N   Preparing Food and eating ? - N  Using the Toilet? - N  In the past six months, have you accidently leaked urine? - N  Do you have problems with loss of bowel control? - N  Managing your Medications? - N  Managing your Finances? - Y  Housekeeping or managing your Housekeeping? - Y    Fall/Depression Screening:    PHQ 2/9 Scores 10/30/2014 10/30/2014 10/20/2014 07/23/2014 11/22/2013 08/03/2013 04/03/2012  PHQ - 2 Score 0 0 3 1 2  0 2  PHQ- 9 Score - - 5 - 3 - 10  Exception Documentation - - -  Other- indicate reason in comment box - - -   Fall Risk  05/18/2015 12/18/2014 11/15/2014 10/30/2014 10/30/2014  Falls in the past year? Yes Yes - Yes Yes  Number falls in past yr: 2 or more 2 or more 1 - 1  Injury with Fall? Yes No - - No  Risk Factor Category  High Fall Risk High Fall Risk - - -  Risk for fall due to : - Medication side effect;History of fall(s) - History of fall(s);Impaired balance/gait Impaired balance/gait;History of fall(s)  Risk for fall due to (comments): - - - uses cane for ambulation -  Follow up Falls prevention discussed;Follow up appointment Education provided;Falls prevention discussed - - Education provided    Assessment: RNCM made home visit as part of the transition of care program. Pt noted to be doing well. Diabetes: Sugars wnl. Pt checking sugars bid. HF: lungs bilat. Pt denies increased SOB, decreased appetite, orthopnea or claudication. Pt's weights stable. Education and review done on HF zones and HF plan given to pt by MD. Pt verbalized understanding.  COPD: Pt denies SOB. Continues inhalers and nebulizers as ordered.  Pt has seen primary care MD since leaving SNF.   Pt in agreement that transitioning to health coach would be beneficial when transition of care program completed.     Plan: RNCM will call pt next week as continued part of the transition of care program.  Sonnet Rizor RN, BSN  [facility] Care Management [phone])  [facility] CM Care Plan Problem One         Most Recent Value   Care Plan Problem One  Pt at high risk for readmission realted to multiple comorbidities.    Role Documenting the Problem One  Care Management Coordinator   Care Plan for Problem One  Active   THN Long Term Goal (31-90 days)  Pt will not be readmitted to the hospital in the next 31 days   THN Long Term Goal Start Date  05/16/15   [facility] Long Term Goal Met Date  01/31/15   Interventions for Problem One Long Term Goal  Transition of care program started. Transition of care visit.    THN CM Short Term Goal #1 (0-30 days)  Pt will monitor weights daily for the next 30 days relatted to being off daily lasix.   THN CM Short Term Goal #1 Start Date  05/16/15   [facility] CM Short Term Goal #1 Met Date  11/17/14   Interventions for Short Term Goal #1  RNCM reviewed with pt the importance of daily weights, recording daily. RNCM provided pt with new THN log book for recording daily weights.   THN CM Short Term Goal #2 (0-30 days)  Pt will be free from falls for the next 30 days.   THN CM Short Term Goal #2 Start Date  05/25/15   [facility] CM Short Term Goal #2 Met Date  12/18/14   Interventions for Short Term Goal #2  RNCM Assisted pt in setting up OT appt. Pt educated pt about fall safety and completing PT and OT with [place] for balance.    THN CM Short Term Goal #3 (0-30 days)  Pt will weigh and record weights for the 30days, recognizing an increase of 3lbs in 1 day or 5lbs in 7 days as a reason to call the MD.   [facility] CM Short Term Goal #3 Start Date  11/17/14   [facility] CM Short Term Goal #3 Met Date  12/18/14  Interventions for Short Tern Goal #3  Pt educated on the importance of daily weights in heart failure. Reinforced the HF zones pt has on her refrigerator.   THN CM Short Term Goal #4 (0-30 days)  Pt will obtain and record b/p every week with home monitor for the next 30 days.   THN CM Short Term Goal #4 Start Date  12/18/14   [facility]N CM Short Term Goal #4 Met Date  -- [pt did not meet this  goal/does not want to restart]   Interventions for Short Term Goal #4  RNCM placed todays b/p on the calendar and marked off each Monday for pt to put new readings. Discussed the importance of knowing her numbers and what normals are.   THN CM Short Term Goal #5 (0-30 days)  Pt will go through colonoscopy with out complication in the next 30 days.   THN CM Short Term Goal #5 Start Date  12/18/14   [facility] CM Short Term Goal #5 Met Date  01/31/15   Interventions for Short Term Goal #5  RNCM went over all instructions for colonoscopy with pt. RNCM marked on pt's calendar the different diets she was to take part in. RNCM wrote plainly the medications she was to stop and take on the procedure day. RNCM and pt went over the directions for the sure prep. RNCM talked with pt about being safe as a diabetic on a clear liquid diet.     [facility] CM Care Plan Problem Two        Most Recent Value   Care Plan Problem Two  Pt would like to be able to participate in more of her usual activities including crochet classes at [facility] center    Role Documenting the Problem Two  Care Management [employer] for Problem Two  Not Active   Interventions for Problem Two Long Term Goal   RNCM talked with pt about her concerns, and made goals to increase  confidence in herself and increase her strength to participate.   THN Long Term Goal (31-90) days  Pt will be able to attend crochet class at the [place] [facility] before the next 90 days   THN Long Term Goal Start Date  01/31/15   [facility] Long Term Goal Met Date  -- [goal unmet pt admitted to hosp]   THN CM Short Term Goal #1 (0-30 days)  Pt will attend new cardiologist appointment on 12/7 and ask MD pt question of at what weight should she take her extra lasix in the next 30 days.   THN CM Short Term Goal #1 Start Date  04/10/15   [facility]N CM Short Term Goal #1 Met Date   04/18/15   Interventions for Short Term Goal #2   RNCM reviewed daily weights with pt and although  she had gained weight did not take extra lasix as instructed by MD. RNCM reviewed and reeducated pt on the importance of keeping up with weight gains in HF.    THN CM Short Term Goal #2 (0-30 days)  Pt will increase her strength by starting to do chair exercise video she already has 2 times per week and walk in a store 1 time per week. for the next 30 days.   THN CM Short Term Goal #2 Start Date  01/31/15   [facility] CM Short Term Goal #2 Met Date  03/02/15   Interventions for Short Term Goal #2  RNCM discussed with pt ways to increase  her strength and have more confidence in her ability to be independant. RNCM assisted pt in a simple way to record days she was sucessful in exercising.     THN CM Short Term Goal #3 (0-30 days)  Pt will recognize 3lb weight gin in one day and/or 5lb weight gain in 1 week and call MD to report in the next 30 days.   THN CM Short Term Goal #3 Start Date  04/10/15 [goal restarted]   THN CM Short Term Goal #3 Met Date  -- [goal restarted ]   Interventions for Short Term Goal #3  RNCM used pt weight log to show pt her weight gain of 5 pounds in one week. RNCM contacted MD for pt and made pt aware of MD's reccomendations. RNCM reeducated pt on s/s of heart failure.   THN CM Short Term Goal #4 (0-30 days)  Pt will obtain new glucometer in the next 30 days related to current meter company went out of business.    THN CM Short Term Goal #4 Start Date  03/02/15   [facility] CM Short Term Goal #4 Met Date  04/10/15   Interventions of Short Term Goal #4  RNCM contacted insurance company and pharmacy to inquire about pt obtaining a covered meter. [facility] pharmacist also involved with assisting with obtaining new meter. RNCM gave pt enough strips to test BID for 4 more days until new meter could be obtained.

## 2015-05-26 NOTE — Telephone Encounter (Signed)
Ok to refill,  Refill sent  

## 2015-05-28 DIAGNOSIS — M810 Age-related osteoporosis without current pathological fracture: Secondary | ICD-10-CM | POA: Diagnosis not present

## 2015-05-28 DIAGNOSIS — I255 Ischemic cardiomyopathy: Secondary | ICD-10-CM | POA: Diagnosis not present

## 2015-05-28 DIAGNOSIS — I5032 Chronic diastolic (congestive) heart failure: Secondary | ICD-10-CM | POA: Diagnosis not present

## 2015-05-28 DIAGNOSIS — E1121 Type 2 diabetes mellitus with diabetic nephropathy: Secondary | ICD-10-CM | POA: Diagnosis not present

## 2015-05-28 DIAGNOSIS — E1165 Type 2 diabetes mellitus with hyperglycemia: Secondary | ICD-10-CM | POA: Diagnosis not present

## 2015-05-28 DIAGNOSIS — J449 Chronic obstructive pulmonary disease, unspecified: Secondary | ICD-10-CM | POA: Diagnosis not present

## 2015-05-30 ENCOUNTER — Telehealth: Payer: Self-pay

## 2015-05-30 NOTE — Telephone Encounter (Signed)
Pamela Collier from [facility] stated that pt had OT for first time on 05/28/15 there was delay in care./tvw

## 2015-05-31 DIAGNOSIS — E1121 Type 2 diabetes mellitus with diabetic nephropathy: Secondary | ICD-10-CM | POA: Diagnosis not present

## 2015-05-31 DIAGNOSIS — I5032 Chronic diastolic (congestive) heart failure: Secondary | ICD-10-CM | POA: Diagnosis not present

## 2015-05-31 DIAGNOSIS — J449 Chronic obstructive pulmonary disease, unspecified: Secondary | ICD-10-CM | POA: Diagnosis not present

## 2015-05-31 DIAGNOSIS — E1165 Type 2 diabetes mellitus with hyperglycemia: Secondary | ICD-10-CM | POA: Diagnosis not present

## 2015-05-31 DIAGNOSIS — I255 Ischemic cardiomyopathy: Secondary | ICD-10-CM | POA: Diagnosis not present

## 2015-05-31 DIAGNOSIS — M810 Age-related osteoporosis without current pathological fracture: Secondary | ICD-10-CM | POA: Diagnosis not present

## 2015-06-01 DIAGNOSIS — M810 Age-related osteoporosis without current pathological fracture: Secondary | ICD-10-CM | POA: Diagnosis not present

## 2015-06-01 DIAGNOSIS — E1121 Type 2 diabetes mellitus with diabetic nephropathy: Secondary | ICD-10-CM | POA: Diagnosis not present

## 2015-06-01 DIAGNOSIS — I5032 Chronic diastolic (congestive) heart failure: Secondary | ICD-10-CM | POA: Diagnosis not present

## 2015-06-01 DIAGNOSIS — E1165 Type 2 diabetes mellitus with hyperglycemia: Secondary | ICD-10-CM | POA: Diagnosis not present

## 2015-06-01 DIAGNOSIS — J449 Chronic obstructive pulmonary disease, unspecified: Secondary | ICD-10-CM | POA: Diagnosis not present

## 2015-06-01 DIAGNOSIS — I255 Ischemic cardiomyopathy: Secondary | ICD-10-CM | POA: Diagnosis not present

## 2015-06-05 DIAGNOSIS — J449 Chronic obstructive pulmonary disease, unspecified: Secondary | ICD-10-CM | POA: Diagnosis not present

## 2015-06-05 DIAGNOSIS — I5032 Chronic diastolic (congestive) heart failure: Secondary | ICD-10-CM | POA: Diagnosis not present

## 2015-06-05 DIAGNOSIS — E1121 Type 2 diabetes mellitus with diabetic nephropathy: Secondary | ICD-10-CM | POA: Diagnosis not present

## 2015-06-05 DIAGNOSIS — M810 Age-related osteoporosis without current pathological fracture: Secondary | ICD-10-CM | POA: Diagnosis not present

## 2015-06-05 DIAGNOSIS — E1165 Type 2 diabetes mellitus with hyperglycemia: Secondary | ICD-10-CM | POA: Diagnosis not present

## 2015-06-05 DIAGNOSIS — I255 Ischemic cardiomyopathy: Secondary | ICD-10-CM | POA: Diagnosis not present

## 2015-06-07 ENCOUNTER — Other Ambulatory Visit: Payer: Self-pay | Admitting: *Deleted

## 2015-06-07 DIAGNOSIS — I255 Ischemic cardiomyopathy: Secondary | ICD-10-CM | POA: Diagnosis not present

## 2015-06-07 DIAGNOSIS — M810 Age-related osteoporosis without current pathological fracture: Secondary | ICD-10-CM | POA: Diagnosis not present

## 2015-06-07 DIAGNOSIS — E1165 Type 2 diabetes mellitus with hyperglycemia: Secondary | ICD-10-CM | POA: Diagnosis not present

## 2015-06-07 DIAGNOSIS — I5032 Chronic diastolic (congestive) heart failure: Secondary | ICD-10-CM | POA: Diagnosis not present

## 2015-06-07 DIAGNOSIS — E1121 Type 2 diabetes mellitus with diabetic nephropathy: Secondary | ICD-10-CM | POA: Diagnosis not present

## 2015-06-07 DIAGNOSIS — J449 Chronic obstructive pulmonary disease, unspecified: Secondary | ICD-10-CM | POA: Diagnosis not present

## 2015-06-07 NOTE — Patient Outreach (Signed)
Unsuccessful outreach for [facility] outreach for transition of care. HIPAA compliant message left with husband.   Plan: RNCM will await return call  RNCM will attempt outreach again in 2 days.  Rutherford Limerick RN, BSN  [facility] Care Management [phone])

## 2015-06-08 ENCOUNTER — Telehealth: Payer: Self-pay | Admitting: Internal Medicine

## 2015-06-08 ENCOUNTER — Other Ambulatory Visit: Payer: Self-pay | Admitting: Internal Medicine

## 2015-06-08 ENCOUNTER — Other Ambulatory Visit (INDEPENDENT_AMBULATORY_CARE_PROVIDER_SITE_OTHER): Payer: Medicare Other

## 2015-06-08 DIAGNOSIS — E1165 Type 2 diabetes mellitus with hyperglycemia: Secondary | ICD-10-CM

## 2015-06-08 DIAGNOSIS — E1169 Type 2 diabetes mellitus with other specified complication: Secondary | ICD-10-CM | POA: Diagnosis not present

## 2015-06-08 DIAGNOSIS — E785 Hyperlipidemia, unspecified: Secondary | ICD-10-CM

## 2015-06-08 DIAGNOSIS — E559 Vitamin D deficiency, unspecified: Secondary | ICD-10-CM | POA: Diagnosis not present

## 2015-06-08 DIAGNOSIS — I5032 Chronic diastolic (congestive) heart failure: Secondary | ICD-10-CM | POA: Diagnosis not present

## 2015-06-08 DIAGNOSIS — Z794 Long term (current) use of insulin: Secondary | ICD-10-CM

## 2015-06-08 DIAGNOSIS — E1121 Type 2 diabetes mellitus with diabetic nephropathy: Secondary | ICD-10-CM | POA: Diagnosis not present

## 2015-06-08 DIAGNOSIS — J449 Chronic obstructive pulmonary disease, unspecified: Secondary | ICD-10-CM | POA: Diagnosis not present

## 2015-06-08 DIAGNOSIS — I255 Ischemic cardiomyopathy: Secondary | ICD-10-CM | POA: Diagnosis not present

## 2015-06-08 DIAGNOSIS — IMO0002 Reserved for concepts with insufficient information to code with codable children: Secondary | ICD-10-CM

## 2015-06-08 DIAGNOSIS — M810 Age-related osteoporosis without current pathological fracture: Secondary | ICD-10-CM | POA: Diagnosis not present

## 2015-06-08 LAB — COMPREHENSIVE METABOLIC PANEL
ALT: 15 U/L (ref 0–35)
AST: 19 U/L (ref 0–37)
Albumin: 4 g/dL (ref 3.5–5.2)
Alkaline Phosphatase: 49 U/L (ref 39–117)
BUN: 36 mg/dL — AB (ref 6–23)
CO2: 28 meq/L (ref 19–32)
CREATININE: 1.34 mg/dL — AB (ref 0.40–1.20)
Calcium: 9.8 mg/dL (ref 8.4–10.5)
Chloride: 104 mEq/L (ref 96–112)
GFR: 49.58 mL/min — ABNORMAL LOW (ref >60.00)
GLUCOSE: 169 mg/dL — AB (ref 70–99)
POTASSIUM: 4.7 meq/L (ref 3.5–5.1)
SODIUM: 137 meq/L (ref 135–145)
Total Bilirubin: 0.7 mg/dL (ref 0.2–1.2)
Total Protein: 7.8 g/dL (ref 6.0–8.3)

## 2015-06-08 LAB — LIPID PANEL
CHOL/HDL RATIO: 5
Cholesterol: 151 mg/dL (ref 0–200)
HDL: 31.8 mg/dL — AB (ref >39.00)
LDL CALC: 89 mg/dL (ref 0–99)
NONHDL: 119.01
Triglycerides: 148 mg/dL (ref 0.0–149.0)
VLDL: 29.6 mg/dL (ref 0.0–40.0)

## 2015-06-08 LAB — LDL CHOLESTEROL, DIRECT: LDL DIRECT: 92 mg/dL

## 2015-06-08 LAB — MICROALBUMIN / CREATININE URINE RATIO
Creatinine,U: 79.9 mg/dL
MICROALB UR: 23.1 mg/dL — AB (ref 0.0–1.9)
Microalb Creat Ratio: 28.9 mg/g (ref 0.0–30.0)

## 2015-06-08 LAB — HEMOGLOBIN A1C: HEMOGLOBIN A1C: 7.8 % — AB (ref 4.6–6.5)

## 2015-06-08 LAB — VITAMIN D 25 HYDROXY (VIT D DEFICIENCY, FRACTURES): VITD: 32.9 ng/mL (ref 30.00–100.00)

## 2015-06-08 NOTE — Telephone Encounter (Signed)
Patient missed her OT visit with [facility] on 1.25.17. Today will be her last visit with OT she is doing great.

## 2015-06-08 NOTE — Telephone Encounter (Signed)
Insurance form looks like accident policy.

## 2015-06-08 NOTE — Telephone Encounter (Signed)
Pt dropped off hospital stay form to be filled out by Dr. Derrel Nip. Form is in Dr. Lupita Dawn box.

## 2015-06-10 DIAGNOSIS — Z7689 Persons encountering health services in other specified circumstances: Secondary | ICD-10-CM

## 2015-06-10 NOTE — Telephone Encounter (Signed)
finished and returned to you

## 2015-06-11 DIAGNOSIS — E1165 Type 2 diabetes mellitus with hyperglycemia: Secondary | ICD-10-CM | POA: Diagnosis not present

## 2015-06-11 DIAGNOSIS — J449 Chronic obstructive pulmonary disease, unspecified: Secondary | ICD-10-CM | POA: Diagnosis not present

## 2015-06-11 DIAGNOSIS — I5032 Chronic diastolic (congestive) heart failure: Secondary | ICD-10-CM | POA: Diagnosis not present

## 2015-06-11 DIAGNOSIS — I255 Ischemic cardiomyopathy: Secondary | ICD-10-CM | POA: Diagnosis not present

## 2015-06-11 DIAGNOSIS — E1121 Type 2 diabetes mellitus with diabetic nephropathy: Secondary | ICD-10-CM | POA: Diagnosis not present

## 2015-06-11 DIAGNOSIS — M810 Age-related osteoporosis without current pathological fracture: Secondary | ICD-10-CM | POA: Diagnosis not present

## 2015-06-11 NOTE — Telephone Encounter (Signed)
Notified patient form ready for pick up.

## 2015-06-12 ENCOUNTER — Encounter: Payer: Self-pay | Admitting: Internal Medicine

## 2015-06-12 DIAGNOSIS — I5032 Chronic diastolic (congestive) heart failure: Secondary | ICD-10-CM | POA: Diagnosis not present

## 2015-06-12 DIAGNOSIS — M810 Age-related osteoporosis without current pathological fracture: Secondary | ICD-10-CM | POA: Diagnosis not present

## 2015-06-12 DIAGNOSIS — E1165 Type 2 diabetes mellitus with hyperglycemia: Secondary | ICD-10-CM | POA: Diagnosis not present

## 2015-06-12 DIAGNOSIS — I255 Ischemic cardiomyopathy: Secondary | ICD-10-CM | POA: Diagnosis not present

## 2015-06-12 DIAGNOSIS — J449 Chronic obstructive pulmonary disease, unspecified: Secondary | ICD-10-CM | POA: Diagnosis not present

## 2015-06-12 DIAGNOSIS — E1121 Type 2 diabetes mellitus with diabetic nephropathy: Secondary | ICD-10-CM | POA: Diagnosis not present

## 2015-06-14 DIAGNOSIS — I255 Ischemic cardiomyopathy: Secondary | ICD-10-CM | POA: Diagnosis not present

## 2015-06-14 DIAGNOSIS — I5032 Chronic diastolic (congestive) heart failure: Secondary | ICD-10-CM | POA: Diagnosis not present

## 2015-06-14 DIAGNOSIS — E1121 Type 2 diabetes mellitus with diabetic nephropathy: Secondary | ICD-10-CM | POA: Diagnosis not present

## 2015-06-14 DIAGNOSIS — E1165 Type 2 diabetes mellitus with hyperglycemia: Secondary | ICD-10-CM | POA: Diagnosis not present

## 2015-06-14 DIAGNOSIS — M810 Age-related osteoporosis without current pathological fracture: Secondary | ICD-10-CM | POA: Diagnosis not present

## 2015-06-14 DIAGNOSIS — J449 Chronic obstructive pulmonary disease, unspecified: Secondary | ICD-10-CM | POA: Diagnosis not present

## 2015-06-15 ENCOUNTER — Other Ambulatory Visit: Payer: Self-pay | Admitting: *Deleted

## 2015-06-15 DIAGNOSIS — I509 Heart failure, unspecified: Secondary | ICD-10-CM

## 2015-06-15 NOTE — Patient Outreach (Signed)
Transition of care call # 4. Pt in good spirits related to her son and daughter in law are in town and they are going to take pt and her spouse back with them to [place] to visit pt's grandchildren and elderly mother. She stated she would be gone approximately 2 weeks.Pt stated she felt good and was keeping up with weights and sugars. Pt stated her weights were averaging around 190 pounds, and her sugar was "around 160". Pt denies increased swelling, SOB, or cough. Pt denies decreased appetite or inability to sleep at night. Pt stated she had regained right much strength since working with PT and the [facility] had her last visit this week. RNCM discussed the transition to the Health Coach. Pt verbalized understanding, stating she felt this would work well for her. RNCM reminded pt to be careful of sodium intake while on vacation. RNCM was appreciative of pharmacy, RNCM and SW assistance and stated she was looking forward to working with the health coach.   Plan: RNCM will send an order to health coach to assist with chronic disease education review.   Rutherford Limerick RN, BSN  [facility] Care Management [phone])

## 2015-06-26 ENCOUNTER — Inpatient Hospital Stay
Admission: EM | Admit: 2015-06-26 | Discharge: 2015-06-29 | DRG: 190 | Disposition: A | Discharge status: Home Health | Payer: Medicare Other | Attending: Internal Medicine | Admitting: Internal Medicine

## 2015-06-26 ENCOUNTER — Emergency Department: Payer: Medicare Other

## 2015-06-26 ENCOUNTER — Inpatient Hospital Stay: Payer: Medicare Other

## 2015-06-26 DIAGNOSIS — Z7982 Long term (current) use of aspirin: Secondary | ICD-10-CM | POA: Diagnosis not present

## 2015-06-26 DIAGNOSIS — R748 Abnormal levels of other serum enzymes: Secondary | ICD-10-CM | POA: Diagnosis present

## 2015-06-26 DIAGNOSIS — Z79899 Other long term (current) drug therapy: Secondary | ICD-10-CM

## 2015-06-26 DIAGNOSIS — J918 Pleural effusion in other conditions classified elsewhere: Secondary | ICD-10-CM | POA: Diagnosis present

## 2015-06-26 DIAGNOSIS — I10 Essential (primary) hypertension: Secondary | ICD-10-CM | POA: Diagnosis not present

## 2015-06-26 DIAGNOSIS — J9600 Acute respiratory failure, unspecified whether with hypoxia or hypercapnia: Secondary | ICD-10-CM | POA: Diagnosis present

## 2015-06-26 DIAGNOSIS — Z8673 Personal history of transient ischemic attack (TIA), and cerebral infarction without residual deficits: Secondary | ICD-10-CM | POA: Diagnosis not present

## 2015-06-26 DIAGNOSIS — Z794 Long term (current) use of insulin: Secondary | ICD-10-CM | POA: Diagnosis not present

## 2015-06-26 DIAGNOSIS — J449 Chronic obstructive pulmonary disease, unspecified: Secondary | ICD-10-CM | POA: Diagnosis not present

## 2015-06-26 DIAGNOSIS — R0600 Dyspnea, unspecified: Secondary | ICD-10-CM

## 2015-06-26 DIAGNOSIS — N183 Chronic kidney disease, stage 3 (moderate): Secondary | ICD-10-CM | POA: Diagnosis not present

## 2015-06-26 DIAGNOSIS — J440 Chronic obstructive pulmonary disease with acute lower respiratory infection: Secondary | ICD-10-CM | POA: Diagnosis not present

## 2015-06-26 DIAGNOSIS — J189 Pneumonia, unspecified organism: Secondary | ICD-10-CM | POA: Diagnosis present

## 2015-06-26 DIAGNOSIS — R778 Other specified abnormalities of plasma proteins: Secondary | ICD-10-CM | POA: Insufficient documentation

## 2015-06-26 DIAGNOSIS — I898 Other specified noninfective disorders of lymphatic vessels and lymph nodes: Secondary | ICD-10-CM | POA: Diagnosis present

## 2015-06-26 DIAGNOSIS — Z9889 Other specified postprocedural states: Secondary | ICD-10-CM

## 2015-06-26 DIAGNOSIS — Z888 Allergy status to other drugs, medicaments and biological substances status: Secondary | ICD-10-CM

## 2015-06-26 DIAGNOSIS — E1151 Type 2 diabetes mellitus with diabetic peripheral angiopathy without gangrene: Secondary | ICD-10-CM | POA: Diagnosis present

## 2015-06-26 DIAGNOSIS — R079 Chest pain, unspecified: Secondary | ICD-10-CM | POA: Diagnosis not present

## 2015-06-26 DIAGNOSIS — E1142 Type 2 diabetes mellitus with diabetic polyneuropathy: Secondary | ICD-10-CM | POA: Diagnosis present

## 2015-06-26 DIAGNOSIS — D860 Sarcoidosis of lung: Secondary | ICD-10-CM | POA: Diagnosis present

## 2015-06-26 DIAGNOSIS — Z825 Family history of asthma and other chronic lower respiratory diseases: Secondary | ICD-10-CM | POA: Diagnosis not present

## 2015-06-26 DIAGNOSIS — I2510 Atherosclerotic heart disease of native coronary artery without angina pectoris: Secondary | ICD-10-CM | POA: Diagnosis present

## 2015-06-26 DIAGNOSIS — R0602 Shortness of breath: Secondary | ICD-10-CM | POA: Diagnosis not present

## 2015-06-26 DIAGNOSIS — E785 Hyperlipidemia, unspecified: Secondary | ICD-10-CM | POA: Diagnosis present

## 2015-06-26 DIAGNOSIS — N184 Chronic kidney disease, stage 4 (severe): Secondary | ICD-10-CM | POA: Diagnosis present

## 2015-06-26 DIAGNOSIS — R918 Other nonspecific abnormal finding of lung field: Secondary | ICD-10-CM | POA: Diagnosis not present

## 2015-06-26 DIAGNOSIS — E1149 Type 2 diabetes mellitus with other diabetic neurological complication: Secondary | ICD-10-CM | POA: Diagnosis not present

## 2015-06-26 DIAGNOSIS — J181 Lobar pneumonia, unspecified organism: Secondary | ICD-10-CM | POA: Diagnosis not present

## 2015-06-26 DIAGNOSIS — Z955 Presence of coronary angioplasty implant and graft: Secondary | ICD-10-CM | POA: Diagnosis not present

## 2015-06-26 DIAGNOSIS — Z803 Family history of malignant neoplasm of breast: Secondary | ICD-10-CM | POA: Diagnosis not present

## 2015-06-26 DIAGNOSIS — I130 Hypertensive heart and chronic kidney disease with heart failure and stage 1 through stage 4 chronic kidney disease, or unspecified chronic kidney disease: Secondary | ICD-10-CM | POA: Diagnosis present

## 2015-06-26 DIAGNOSIS — J209 Acute bronchitis, unspecified: Secondary | ICD-10-CM | POA: Diagnosis present

## 2015-06-26 DIAGNOSIS — Z8249 Family history of ischemic heart disease and other diseases of the circulatory system: Secondary | ICD-10-CM

## 2015-06-26 DIAGNOSIS — M810 Age-related osteoporosis without current pathological fracture: Secondary | ICD-10-CM | POA: Diagnosis present

## 2015-06-26 DIAGNOSIS — R911 Solitary pulmonary nodule: Secondary | ICD-10-CM | POA: Diagnosis present

## 2015-06-26 DIAGNOSIS — E1129 Type 2 diabetes mellitus with other diabetic kidney complication: Secondary | ICD-10-CM | POA: Diagnosis not present

## 2015-06-26 DIAGNOSIS — E1165 Type 2 diabetes mellitus with hyperglycemia: Secondary | ICD-10-CM | POA: Diagnosis present

## 2015-06-26 DIAGNOSIS — D869 Sarcoidosis, unspecified: Secondary | ICD-10-CM | POA: Diagnosis present

## 2015-06-26 DIAGNOSIS — I4510 Unspecified right bundle-branch block: Secondary | ICD-10-CM | POA: Diagnosis present

## 2015-06-26 DIAGNOSIS — J90 Pleural effusion, not elsewhere classified: Secondary | ICD-10-CM

## 2015-06-26 DIAGNOSIS — J948 Other specified pleural conditions: Secondary | ICD-10-CM | POA: Diagnosis not present

## 2015-06-26 DIAGNOSIS — I5032 Chronic diastolic (congestive) heart failure: Secondary | ICD-10-CM | POA: Diagnosis present

## 2015-06-26 DIAGNOSIS — R7989 Other specified abnormal findings of blood chemistry: Secondary | ICD-10-CM | POA: Insufficient documentation

## 2015-06-26 DIAGNOSIS — M79669 Pain in unspecified lower leg: Secondary | ICD-10-CM

## 2015-06-26 DIAGNOSIS — M79661 Pain in right lower leg: Secondary | ICD-10-CM | POA: Diagnosis not present

## 2015-06-26 DIAGNOSIS — I255 Ischemic cardiomyopathy: Secondary | ICD-10-CM | POA: Diagnosis present

## 2015-06-26 DIAGNOSIS — I509 Heart failure, unspecified: Secondary | ICD-10-CM | POA: Insufficient documentation

## 2015-06-26 DIAGNOSIS — Z882 Allergy status to sulfonamides status: Secondary | ICD-10-CM

## 2015-06-26 DIAGNOSIS — Z91041 Radiographic dye allergy status: Secondary | ICD-10-CM | POA: Diagnosis not present

## 2015-06-26 DIAGNOSIS — I440 Atrioventricular block, first degree: Secondary | ICD-10-CM | POA: Diagnosis present

## 2015-06-26 DIAGNOSIS — R791 Abnormal coagulation profile: Secondary | ICD-10-CM | POA: Diagnosis not present

## 2015-06-26 DIAGNOSIS — I25119 Atherosclerotic heart disease of native coronary artery with unspecified angina pectoris: Secondary | ICD-10-CM | POA: Insufficient documentation

## 2015-06-26 DIAGNOSIS — I272 Other secondary pulmonary hypertension: Secondary | ICD-10-CM | POA: Diagnosis present

## 2015-06-26 DIAGNOSIS — Z89421 Acquired absence of other right toe(s): Secondary | ICD-10-CM | POA: Diagnosis not present

## 2015-06-26 DIAGNOSIS — M79662 Pain in left lower leg: Secondary | ICD-10-CM | POA: Diagnosis not present

## 2015-06-26 HISTORY — DX: Chronic diastolic (congestive) heart failure: I50.32

## 2015-06-26 LAB — CBC
HCT: 34.2 % — ABNORMAL LOW (ref 35.0–47.0)
Hemoglobin: 11.3 g/dL — ABNORMAL LOW (ref 12.0–16.0)
MCH: 29.7 pg (ref 26.0–34.0)
MCHC: 33 g/dL (ref 32.0–36.0)
MCV: 90 fL (ref 80.0–100.0)
PLATELETS: 272 10*3/uL (ref 150–440)
RBC: 3.8 MIL/uL (ref 3.80–5.20)
RDW: 15.6 % — ABNORMAL HIGH (ref 11.5–14.5)
WBC: 9.8 10*3/uL (ref 3.6–11.0)

## 2015-06-26 LAB — GLUCOSE, CAPILLARY
GLUCOSE-CAPILLARY: 207 mg/dL — AB (ref 65–99)
GLUCOSE-CAPILLARY: 405 mg/dL — AB (ref 65–99)
Glucose-Capillary: 187 mg/dL — ABNORMAL HIGH (ref 65–99)
Glucose-Capillary: 264 mg/dL — ABNORMAL HIGH (ref 65–99)
Glucose-Capillary: 426 mg/dL — ABNORMAL HIGH (ref 65–99)

## 2015-06-26 LAB — BASIC METABOLIC PANEL
Anion gap: 8 (ref 5–15)
BUN: 24 mg/dL — ABNORMAL HIGH (ref 6–20)
CALCIUM: 9.4 mg/dL (ref 8.9–10.3)
CHLORIDE: 107 mmol/L (ref 101–111)
CO2: 26 mmol/L (ref 22–32)
CREATININE: 1.32 mg/dL — AB (ref 0.44–1.00)
GFR, EST AFRICAN AMERICAN: 45 mL/min — AB (ref >60)
GFR, EST NON AFRICAN AMERICAN: 38 mL/min — AB (ref >60)
Glucose, Bld: 178 mg/dL — ABNORMAL HIGH (ref 65–99)
Potassium: 3.5 mmol/L (ref 3.5–5.1)
SODIUM: 141 mmol/L (ref 135–145)

## 2015-06-26 LAB — TROPONIN I: TROPONIN I: 0.05 ng/mL — AB (ref <0.031)

## 2015-06-26 LAB — BRAIN NATRIURETIC PEPTIDE: B NATRIURETIC PEPTIDE 5: 351 pg/mL — AB (ref 0.0–100.0)

## 2015-06-26 LAB — FIBRIN DERIVATIVES D-DIMER (ARMC ONLY): FIBRIN DERIVATIVES D-DIMER (ARMC): 1465 — AB (ref 0–499)

## 2015-06-26 LAB — STREP PNEUMONIAE URINARY ANTIGEN: STREP PNEUMO URINARY ANTIGEN: NEGATIVE

## 2015-06-26 MED ORDER — AZITHROMYCIN 500 MG PO TABS
500.0000 mg | ORAL_TABLET | Freq: Once | ORAL | Status: AC
  Administered 2015-06-26: 500 mg via ORAL
  Filled 2015-06-26: qty 1

## 2015-06-26 MED ORDER — NITROGLYCERIN 0.4 MG SL SUBL: 0.4000 mg | SUBLINGUAL_TABLET | SUBLINGUAL | Status: DC | PRN

## 2015-06-26 MED ORDER — ALPRAZOLAM 0.5 MG PO TABS
0.5000 mg | ORAL_TABLET | Freq: Every day | ORAL | Status: DC | PRN
  Administered 2015-06-28: 0.5 mg via ORAL
  Filled 2015-06-26: qty 1

## 2015-06-26 MED ORDER — IPRATROPIUM-ALBUTEROL 0.5-2.5 (3) MG/3ML IN SOLN
RESPIRATORY_TRACT | Status: AC
  Administered 2015-06-26: 3 mL via RESPIRATORY_TRACT
  Filled 2015-06-26: qty 6

## 2015-06-26 MED ORDER — PREGABALIN 50 MG PO CAPS
100.0000 mg | ORAL_CAPSULE | Freq: Three times a day (TID) | ORAL | Status: DC
  Administered 2015-06-26 – 2015-06-29 (×9): 100 mg via ORAL
  Filled 2015-06-26 (×9): qty 2

## 2015-06-26 MED ORDER — DOCUSATE SODIUM 100 MG PO CAPS
100.0000 mg | ORAL_CAPSULE | Freq: Two times a day (BID) | ORAL | Status: DC
  Administered 2015-06-26 – 2015-06-29 (×6): 100 mg via ORAL
  Filled 2015-06-26 (×6): qty 1

## 2015-06-26 MED ORDER — INSULIN ASPART PROT & ASPART (70-30 MIX) 100 UNIT/ML ~~LOC~~ SUSP
60.0000 [IU] | Freq: Two times a day (BID) | SUBCUTANEOUS | Status: DC
  Administered 2015-06-26 – 2015-06-27 (×2): 60 [IU] via SUBCUTANEOUS
  Filled 2015-06-26 (×2): qty 60

## 2015-06-26 MED ORDER — METHYLPREDNISOLONE SODIUM SUCC 125 MG IJ SOLR
125.0000 mg | Freq: Once | INTRAMUSCULAR | Status: AC
  Administered 2015-06-26: 125 mg via INTRAVENOUS
  Filled 2015-06-26: qty 2

## 2015-06-26 MED ORDER — IOHEXOL 350 MG/ML SOLN
75.0000 mL | Freq: Once | INTRAVENOUS | Status: AC | PRN
  Administered 2015-06-26: 75 mL via INTRAVENOUS

## 2015-06-26 MED ORDER — ROSUVASTATIN CALCIUM 20 MG PO TABS
20.0000 mg | ORAL_TABLET | Freq: Every day | ORAL | Status: DC
  Administered 2015-06-26 – 2015-06-28 (×3): 20 mg via ORAL
  Filled 2015-06-26 (×3): qty 1

## 2015-06-26 MED ORDER — FLUTICASONE PROPIONATE 50 MCG/ACT NA SUSP
2.0000 | Freq: Every day | NASAL | Status: DC
Start: 2015-06-26 — End: 2015-06-29
  Administered 2015-06-27 – 2015-06-29 (×2): 2 via NASAL
  Filled 2015-06-26 (×2): qty 16

## 2015-06-26 MED ORDER — FAMOTIDINE 20 MG PO TABS
20.0000 mg | ORAL_TABLET | Freq: Once | ORAL | Status: AC
  Administered 2015-06-26: 20 mg via ORAL
  Filled 2015-06-26: qty 1

## 2015-06-26 MED ORDER — AZITHROMYCIN 500 MG IV SOLR
500.0000 mg | INTRAVENOUS | Status: DC
  Filled 2015-06-26: qty 500

## 2015-06-26 MED ORDER — HEPARIN SODIUM (PORCINE) 5000 UNIT/ML IJ SOLN: 5000.0000 [IU] | Freq: Three times a day (TID) | INTRAMUSCULAR | Status: DC

## 2015-06-26 MED ORDER — DOCUSATE SODIUM 100 MG PO CAPS: 100.0000 mg | ORAL_CAPSULE | Freq: Every day | ORAL | Status: DC | PRN

## 2015-06-26 MED ORDER — ONDANSETRON HCL 4 MG/2ML IJ SOLN: 4.0000 mg | Freq: Four times a day (QID) | INTRAMUSCULAR | Status: DC | PRN

## 2015-06-26 MED ORDER — LOSARTAN POTASSIUM 50 MG PO TABS
100.0000 mg | ORAL_TABLET | Freq: Every day | ORAL | Status: DC
  Administered 2015-06-26 – 2015-06-29 (×4): 100 mg via ORAL
  Filled 2015-06-26 (×4): qty 2

## 2015-06-26 MED ORDER — ALBUTEROL SULFATE (2.5 MG/3ML) 0.083% IN NEBU: 2.5000 mg | INHALATION_SOLUTION | Freq: Four times a day (QID) | RESPIRATORY_TRACT | Status: DC | PRN

## 2015-06-26 MED ORDER — ASPIRIN 81 MG PO CHEW
81.0000 mg | CHEWABLE_TABLET | Freq: Every day | ORAL | Status: DC
  Administered 2015-06-26 – 2015-06-27 (×2): 81 mg via ORAL
  Filled 2015-06-26 (×2): qty 1

## 2015-06-26 MED ORDER — DEXTROSE 5 % IV SOLN
1.0000 g | INTRAVENOUS | Status: DC
  Administered 2015-06-27 – 2015-06-29 (×3): 1 g via INTRAVENOUS
  Filled 2015-06-26 (×3): qty 10

## 2015-06-26 MED ORDER — BISACODYL 5 MG PO TBEC: 10.0000 mg | DELAYED_RELEASE_TABLET | Freq: Every day | ORAL | Status: DC | PRN

## 2015-06-26 MED ORDER — TICAGRELOR 90 MG PO TABS
90.0000 mg | ORAL_TABLET | Freq: Two times a day (BID) | ORAL | Status: DC
  Administered 2015-06-26 – 2015-06-27 (×2): 90 mg via ORAL
  Filled 2015-06-26 (×4): qty 1

## 2015-06-26 MED ORDER — ENOXAPARIN SODIUM 40 MG/0.4ML ~~LOC~~ SOLN
40.0000 mg | SUBCUTANEOUS | Status: DC
  Administered 2015-06-26 – 2015-06-28 (×3): 40 mg via SUBCUTANEOUS
  Filled 2015-06-26 (×3): qty 0.4

## 2015-06-26 MED ORDER — METHYLPREDNISOLONE SODIUM SUCC 40 MG IJ SOLR
40.0000 mg | Freq: Once | INTRAMUSCULAR | Status: AC
  Administered 2015-06-26: 40 mg via INTRAVENOUS
  Filled 2015-06-26: qty 1

## 2015-06-26 MED ORDER — FUROSEMIDE 20 MG PO TABS
20.0000 mg | ORAL_TABLET | Freq: Every day | ORAL | Status: DC
  Administered 2015-06-26 – 2015-06-29 (×3): 20 mg via ORAL
  Filled 2015-06-26 (×3): qty 1

## 2015-06-26 MED ORDER — IPRATROPIUM-ALBUTEROL 0.5-2.5 (3) MG/3ML IN SOLN
3.0000 mL | Freq: Once | RESPIRATORY_TRACT | Status: AC
  Administered 2015-06-26: 3 mL via RESPIRATORY_TRACT

## 2015-06-26 MED ORDER — INSULIN ASPART 100 UNIT/ML ~~LOC~~ SOLN
0.0000 [IU] | Freq: Every day | SUBCUTANEOUS | Status: DC
  Filled 2015-06-26: qty 15

## 2015-06-26 MED ORDER — BENZONATATE 100 MG PO CAPS
100.0000 mg | ORAL_CAPSULE | Freq: Three times a day (TID) | ORAL | Status: DC | PRN
Start: 2015-06-26 — End: 2015-06-29

## 2015-06-26 MED ORDER — DEXTROSE 5 % IV SOLN
500.0000 mg | INTRAVENOUS | Status: DC
  Administered 2015-06-27 – 2015-06-28 (×2): 500 mg via INTRAVENOUS
  Filled 2015-06-26 (×2): qty 500

## 2015-06-26 MED ORDER — INSULIN ASPART 100 UNIT/ML ~~LOC~~ SOLN
0.0000 [IU] | Freq: Three times a day (TID) | SUBCUTANEOUS | Status: DC
  Administered 2015-06-26: 5 [IU] via SUBCUTANEOUS
  Administered 2015-06-26: 3 [IU] via SUBCUTANEOUS
  Filled 2015-06-26: qty 5
  Filled 2015-06-26: qty 3

## 2015-06-26 MED ORDER — NITROGLYCERIN 0.4 MG SL SUBL
0.4000 mg | SUBLINGUAL_TABLET | SUBLINGUAL | Status: DC | PRN
  Administered 2015-06-26 – 2015-06-28 (×4): 0.4 mg via SUBLINGUAL
  Filled 2015-06-26 (×3): qty 1

## 2015-06-26 MED ORDER — ISOSORBIDE MONONITRATE ER 30 MG PO TB24
30.0000 mg | ORAL_TABLET | Freq: Every day | ORAL | Status: DC
  Administered 2015-06-26 – 2015-06-29 (×4): 30 mg via ORAL
  Filled 2015-06-26 (×4): qty 1

## 2015-06-26 MED ORDER — ASPIRIN 81 MG PO CHEW
324.0000 mg | CHEWABLE_TABLET | Freq: Once | ORAL | Status: AC
  Administered 2015-06-26: 324 mg via ORAL
  Filled 2015-06-26: qty 4

## 2015-06-26 MED ORDER — PAROXETINE HCL 10 MG PO TABS
10.0000 mg | ORAL_TABLET | Freq: Every day | ORAL | Status: DC
  Administered 2015-06-26 – 2015-06-29 (×4): 10 mg via ORAL
  Filled 2015-06-26 (×4): qty 1

## 2015-06-26 MED ORDER — METOPROLOL TARTRATE 25 MG PO TABS
12.5000 mg | ORAL_TABLET | Freq: Two times a day (BID) | ORAL | Status: DC
  Administered 2015-06-26 – 2015-06-29 (×6): 12.5 mg via ORAL
  Filled 2015-06-26 (×6): qty 1

## 2015-06-26 MED ORDER — DEXTROSE 5 % IV SOLN
1.0000 g | Freq: Once | INTRAVENOUS | Status: AC
  Administered 2015-06-26: 1 g via INTRAVENOUS
  Filled 2015-06-26: qty 10

## 2015-06-26 MED ORDER — DEXTROSE 5 % IV SOLN
1.0000 g | INTRAVENOUS | Status: DC
  Filled 2015-06-26: qty 10

## 2015-06-26 MED ORDER — ACETAMINOPHEN 325 MG PO TABS
650.0000 mg | ORAL_TABLET | Freq: Four times a day (QID) | ORAL | Status: DC | PRN
  Administered 2015-06-26 – 2015-06-28 (×2): 650 mg via ORAL
  Filled 2015-06-26 (×2): qty 2

## 2015-06-26 MED ORDER — DIPHENHYDRAMINE HCL 25 MG PO CAPS
50.0000 mg | ORAL_CAPSULE | Freq: Once | ORAL | Status: AC
  Administered 2015-06-26: 50 mg via ORAL
  Filled 2015-06-26: qty 2

## 2015-06-26 MED ORDER — INSULIN ASPART 100 UNIT/ML ~~LOC~~ SOLN
15.0000 [IU] | Freq: Once | SUBCUTANEOUS | Status: AC
  Administered 2015-06-26: 21:00:00 via SUBCUTANEOUS

## 2015-06-26 NOTE — Progress Notes (Signed)
Notified Dr Levonne Hubert of [facility]. New order placed for 15 units novolog

## 2015-06-26 NOTE — H&P (Signed)
[place] [facility] [place] [name]    MR#:  [id number]  DATE OF BIRTH:  1941/01/27  DATE OF ADMISSION:  06/26/2015  PRIMARY CARE PHYSICIAN: [name], MD   REQUESTING/REFERRING PHYSICIAN: Loney Hering, MD  CHIEF COMPLAINT:   Chief Complaint  Patient presents with  . Shortness of Breath  . Chest Pain  SOB 2 days.  HISTORY OF PRESENT ILLNESS:  Arma Reining  is a 75 y.o. female with a known history of COPD, lung sarcoidosis, HTN, DM, CAD and cardiomyopahty. She has had cough, wheezing and SOB for 2 days. Also, she complained of left side chest pain and left shoulder pain for 2 days. She denies any fever or chills, no orthopnea or nocturnal dyspnea. She traveled to and  from [place] recently. CXR showed right side pneumonia. She was treated with zithromax and rocephin in ED. Dr. Dahlia Client suggested CT chest with contrast to rule out pulmonary embolism, but since the patient has contrast allergy, Dr. Dahlia Client start iv solumedrol with contrast allergy protocol.  PAST MEDICAL HISTORY:   Past Medical History  Diagnosis Date  . Diabetes mellitus   . 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 Cardiomyopathy, ischemic Jan 2013    EF 35 to 40% [facility])  . Hepatic steatosis     by CT abd pelvis  . Osteoporosis, post-menopausal   . 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 Atherosclerotic peripheral vascular disease with gangrene [facility]) august 2012  . Hypertension   . Hyperlipidemia   . Sarcoidosis ([place])   . CHF (congestive heart failure) ([place])   . Renal insufficiency   . 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 CCF (congestive cardiac failure) ([place]) 08/17/2013  . Pleural effusion 10/25/2012    10/2012 CT chest >> small to moderate R lung effusion>> chylothorax, 100% lymphs 10/2013  thoracentesis> cytology negative, WBC 1471, > 90% "small lymphs" 01/2014 CT chest> near complete resolution of pleural effusion, stable lymphadenopathy 08/2014 CT chest [place] ([facility]): small right sided effusion decreased in size, stable mediastinal lymphadenopathy 1.0cm largest, 54m . 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 Acute respiratory failure ([place] 11/21/2014  . Diabetic diarrhea ([place] 10/03/2014  . DM type 2, uncontrolled, with renal complications ([place] 83/05/5398 . Cerebral infarct ([place] 08/17/2013  . Arthritis 08/05/2013  . 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 Hyperlipidemia LDL goal <100 02/23/2014  . COPD (chronic obstructive pulmonary disease) ([place]     PAST SURGICAL HISTORY:   Past Surgical History  Procedure Laterality Date  . Abdominal hysterectomy      at ge 39. secondary to bleeding  . Toe amputation  Sept 2012    Right 2nd toe, [place]  . Ptca  August 2012    Right Posterior tibial artery , [place]  . Colonoscopy with propofol N/A 01/09/2015    Procedure: COLONOSCOPY WITH PROPOFOL;  Surgeon: DLucilla Lame MD;  Location: [facility] ENDOSCOPY;  Service: Endoscopy;  Laterality: N/A;  . Cholecystectomy    .  Carpal tunnel release Bilateral   . Breast cyst aspiration Right   . Coronary angioplasty with stent placement      [place]; [facility]    SOCIAL HISTORY:   Social History  Substance Use Topics  . Smoking status: Never Smoker   . Smokeless tobacco: Never Used  . Alcohol Use: No    FAMILY HISTORY:   Family History  Problem Relation Age of Onset  . Heart disease Father   . Heart attack Father   . Heart disease Sister   . Heart attack Sister   . Heart disease Brother   . Heart attack  Brother   . Asthma Grandchild   . Breast cancer Maternal Aunt     DRUG ALLERGIES:   Allergies  Allergen Reactions  . Contrast Media [Iodinated Diagnostic Agents] Itching  . Gabapentin Itching  . Sulfa Antibiotics Itching    REVIEW OF SYSTEMS:  CONSTITUTIONAL: No fever, has weakness.  EYES: No blurred or double vision.  EARS, NOSE, AND THROAT: No tinnitus or ear pain.  RESPIRATORY: has cough, shortness of breath, wheezing but no hemoptysis.  CARDIOVASCULAR: has chest pain, no orthopnea, edema.  GASTROINTESTINAL: No nausea, vomiting, diarrhea or abdominal pain.  GENITOURINARY: No dysuria, hematuria.  ENDOCRINE: No polyuria, nocturia,  HEMATOLOGY: No anemia, easy bruising or bleeding SKIN: No rash or lesion. MUSCULOSKELETAL: No joint pain or arthritis.   NEUROLOGIC: No tingling, numbness, weakness.  PSYCHIATRY: No anxiety or depression.   MEDICATIONS AT HOME:   Prior to Admission medications   Medication Sig Start Date End Date Taking? Authorizing Provider  albuterol (PROVENTIL) (2.5 MG/3ML) 0.083% nebulizer solution Take 3 mLs (2.5 mg total) by nebulization every 6 (six) hours as needed for wheezing or shortness of breath. 12/06/14   [name], MD  alendronate (FOSAMAX) 70 MG tablet Take with a full glass of water on an empty stomach every Sunday  Take with a full glass of water and take no other meds or food for   30 minutes . 12/01/14   [name], MD  ALPRAZolam [name]) 0.5 MG tablet Take 1 tablet (0.5 mg total) by mouth daily as needed for sleep or anxiety. 12/01/14   [name], MD  aspirin 81 MG EC tablet Take 1 tablet (81 mg total) by mouth daily. Swallow whole. 04/18/12   [name], MD  benzonatate (TESSALON) 100 MG capsule TAKE TWO CAPSULES BY MOUTH THREE TIMES DAILY AS NEEDED FOR COUGH 05/28/15   [name], MD  benzonatate (TESSALON) 200 MG capsule Take 1 capsule (200 mg total) by mouth 3 (three) times daily as needed for cough. Reported on 05/17/2015  05/25/15   [name], MD  Blood Glucose Monitoring Suppl (ACCU-CHEK AVIVA) device Use as instructed 02/26/15 02/26/16  [name], MD  Blood Glucose Monitoring Suppl (TRUERESULT BLOOD GLUCOSE) W/DEVICE KIT 1 kit by Does not apply route 3 (three) times daily. 09/27/13   [name], MD  BRILINTA 90 MG TABS tablet Take 1 tablet (90 mg total) by mouth 2 (two) times daily. 12/06/14   [name], MD  budesonide-formoterol (SYMBICORT) 160-4.5 MCG/ACT inhaler Inhale 2 puffs into the lungs 2 (two) times daily. 02/23/15   Juanito Doom, MD  COMBIVENT RESPIMAT 20-100 MCG/ACT AERS respimat Inhale 1 puff into the lungs 3 (three) times daily as needed. Patient taking differently: Inhale 1 puff into the lungs 3 (three) times daily as needed for wheezing or shortness of  breath.  12/06/14   [name], MD  docusate sodium (COLACE) 100 MG capsule Take 100 mg by mouth daily as needed for mild constipation. Reported on 05/25/2015    Historical Provider, MD  ergocalciferol (DRISDOL) 50000 units capsule Take 1 capsule (50,000 Units total) by mouth once a week. With Sunday dinner 05/17/15   [name], MD  fluticasone (FLONASE) 50 MCG/ACT nasal spray Place 2 sprays into both nostrils daily. 12/06/14   [name], MD  furosemide (LASIX) 20 MG tablet Take 1 tablet (20 mg total) by mouth daily. Patient not taking: Reported on 05/17/2015 12/01/14   [name], MD  glucose blood (ACCU-CHEK AVIVA) test strip Use up to three times a day. 03/02/15   [name], MD  insulin aspart protamine - aspart (NOVOLOG 70/30 MIX) (70-30) 100 UNIT/ML FlexPen 60 units before breakfast,  60 units before dinner.  NEEDS 15 PENS PER MONTH 12/06/14   [name], MD  Insulin Pen Needle (B-D UF III MINI PEN NEEDLES) 31G X 5 MM MISC Use  Three times daily 12/01/14   [name], MD  isosorbide mononitrate (IMDUR) 30 MG 24 hr tablet TAKE 1 TABLET DAILY 06/08/15   [name], MD  Lancet [facility])  lancets Utilize lancets three times a day to check blood glucose levels. 03/06/15   [name], MD  losartan (COZAAR) 100 MG tablet Take 1 tablet (100 mg total) by mouth daily. 12/01/14   [name], MD  LYRICA 100 MG capsule TAKE ONE CAPSULE BY MOUTH THREE TIMES DAILY 05/23/15   [name], MD  metoprolol tartrate (LOPRESSOR) 12.5 mg TABS tablet Take 12.5 mg by mouth 2 (two) times daily. Hold for SBP <110 and HR <60/min    Historical Provider, MD  nitroGLYCERIN (NITROSTAT) 0.4 MG SL tablet Place 1 tablet (0.4 mg total) under the tongue every 5 (five) minutes as needed for chest pain. 04/18/15   Minna Merritts, MD  PARoxetine (PAXIL) 10 MG tablet Take 1 tablet (10 mg total) by mouth daily. 12/06/14   [name], MD  rosuvastatin (CRESTOR) 20 MG tablet Take 1 tablet (20 mg total) by mouth daily. Patient taking differently: Take 20 mg by mouth at bedtime.  12/01/14   [name], MD  Spacer/Aero-Holding Chambers (AEROCHAMBER MV) inhaler Use as instructed 02/23/15   Juanito Doom, MD  traMADol (ULTRAM) 50 MG tablet Take 1 tablet (50 mg total) by mouth every 6 (six) hours as needed for moderate pain. Patient not taking: Reported on 05/25/2015 12/01/14   [name], MD  Vitamin D, Ergocalciferol, (DRISDOL) 50000 UNITS CAPS capsule Take 50,000 Units by mouth every 7 (seven) days. Reported on 05/25/2015    Historical Provider, MD      VITAL SIGNS:  Blood pressure 138/68, pulse 73, temperature 98 F (36.7 C), temperature source Oral, resp. rate 21, height 5' 6"  (1.676 m), weight 85.594 kg (188 lb 11.2 oz), SpO2 94 %.  PHYSICAL EXAMINATION:  GENERAL:  75 y.o.-year-old patient lying in the bed with no acute distress.  EYES: Pupils equal, round, reactive to light and accommodation. No scleral icterus. Extraocular muscles intact.  HEENT: Head atraumatic, normocephalic. Oropharynx and nasopharynx clear.  NECK:  Supple, no jugular venous distention. No thyroid enlargement, no  tenderness.  LUNGS: Normal breath sounds bilaterally, no wheezing, rales,rhonchi or crepitation. No use of accessory muscles of respiration. Left side chest wall tenderness. CARDIOVASCULAR: S1, S2 normal.  No murmurs, rubs, or gallops.  ABDOMEN: Soft, nontender, nondistended. Bowel sounds present. No organomegaly or mass.  EXTREMITIES: No pedal edema, cyanosis, or clubbing. Left shoulder tenderness.  NEUROLOGIC: Cranial nerves II through XII are intact. Muscle strength 5/5 in all extremities. Sensation intact. Gait not checked.  PSYCHIATRIC: The patient is alert and oriented x 3.  SKIN: No obvious rash, lesion, or ulcer.   LABORATORY PANEL:   CBC  Recent Labs Lab 06/26/15 0545  WBC 9.8  HGB 11.3*  HCT 34.2*  PLT 272   ------------------------------------------------------------------------------------------------------------------  Chemistries   Recent Labs Lab 06/26/15 0545  NA 141  K 3.5  CL 107  CO2 26  GLUCOSE 178*  BUN 24*  CREATININE 1.32*  CALCIUM 9.4   ------------------------------------------------------------------------------------------------------------------  Cardiac Enzymes  Recent Labs Lab 06/26/15 0545  TROPONINI 0.05*   ------------------------------------------------------------------------------------------------------------------  RADIOLOGY:  Dg Chest Portable 1 View  06/26/2015  CLINICAL DATA:  Patient woke this morning with wheezing and difficulty breathing. Left-sided chest pain since yesterday. EXAM: PORTABLE CHEST 1 VIEW COMPARISON:  04/18/2015 FINDINGS: Shallow inspiration. Heart size and pulmonary vascularity appear normal. Since the previous study, there is increased density inferior to the right hilum. This could represent vascular crowding due to shallow inspiration, developing mass or infiltration. Given the history, this could indicate pneumonia. Followup PA and lateral chest X-ray is recommended in 3-4 weeks following trial of  antibiotic therapy to ensure resolution and exclude underlying malignancy. Old left rib fractures. No pneumothorax. No blunting of costophrenic angles there IMPRESSION: New opacity in the right infrahilar region could represent shallow inspiration common developing mass, or pneumonia. Electronically Signed   By: Lucienne Capers M.D.   On: 06/26/2015 06:16    EKG:   Orders placed or performed during the hospital encounter of 06/26/15  . ED EKG  . ED EKG  . EKG 12-Lead  . EKG 12-Lead    IMPRESSION AND PLAN:   Right side PNA, R/U PE Zithromax and rocephin, f/u blood culture and CBC. CT angio with contrast allergy protocol to rule out PE. Venous dupplex.  COPD. Duoneb prn. Sarcoidosis  CKD stage 3. F/u BMP HTN. Continue HTN medication. DM. Start sliding scale. Continue 70/30 SQ bid.   All the records are reviewed and case discussed with ED provider. Management plans discussed with the patient, her husband and they are in agreement.  CODE STATUS: Full code.  TOTAL TIME TAKING CARE OF THIS PATIENT: 60 minutes.    Demetrios Loll M.D on 06/26/2015 at 7:49 AM  Between 7am to 6pm - Pager - [phone]  After 6pm go to [email or web address] - password [email or web address] [facility]  Office  [phone]  CC: Primary care physician; [name], MD

## 2015-06-26 NOTE — Consult Note (Signed)
MEDICATION RELATED CONSULT NOTE - INITIAL   Pharmacy Consult for Renal Dose Adjustment Indication: Renal dysfunction  Allergies  Allergen Reactions  . Contrast Media [Iodinated Diagnostic Agents] Itching  . Gabapentin Itching  . Sulfa Antibiotics Itching    Patient Measurements: Height: 5\' 6"  (167.6 cm) Weight: 188 lb 11.2 oz (85.594 kg) IBW/kg (Calculated) : 59.3  Vital Signs: Temp: 98.5 F (36.9 C) (02/14 0916) Temp Source: Oral (02/14 0916) BP: 168/76 mmHg (02/14 0916) Pulse Rate: 70 (02/14 0916) Intake/Output from previous day:   Intake/Output from this shift: Total I/O In: -  Out: 350 [Urine:350]  Labs:  Recent Labs  06/26/15 0545  WBC 9.8  HGB 11.3*  HCT 34.2*  PLT 272  CREATININE 1.32*   Estimated Creatinine Clearance: 40.6 mL/min (by C-G formula based on Cr of 1.32).   Microbiology: No results found for this or any previous visit (from the past 720 hour(s)).  Medical History: Past Medical History  Diagnosis Date  . Diabetes mellitus   . 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 Cardiomyopathy, ischemic Jan 2013    EF 35 to 40% [facility])  . Hepatic steatosis     by CT abd pelvis  . Osteoporosis, post-menopausal   . 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 Atherosclerotic peripheral vascular disease with gangrene [facility]) august 2012  . Hypertension   . Hyperlipidemia   . Sarcoidosis ([place])   . CHF (congestive heart failure) ([place])   . Renal insufficiency   . 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 CCF (congestive cardiac failure) ([place]) 08/17/2013  . Pleural effusion 10/25/2012    10/2012 CT chest >> small to moderate R lung effusion>> chylothorax, 100% lymphs 10/2013 thoracentesis> cytology negative, WBC 1471, > 90% "small lymphs" 01/2014 CT chest> near complete resolution of pleural effusion, stable lymphadenopathy  08/2014 CT chest [place] ([facility]): small right sided effusion decreased in size, stable mediastinal lymphadenopathy 1.0cm largest, 42m  . 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 Acute respiratory failure ([place]) 11/21/2014  . Diabetic diarrhea ([place]) 10/03/2014  . DM type 2, uncontrolled, with renal complications ([place]) [id number]  . Cerebral infarct ([place]) 08/17/2013  . Arthritis 08/05/2013  . 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 Hyperlipidemia LDL goal <100 02/23/2014  . COPD (chronic obstructive pulmonary disease) ([facility])     Medications:  Scheduled:  . [START ON 06/27/2015] azithromycin  500 mg Intravenous Q24H  . [START ON 06/27/2015] cefTRIAXone (ROCEPHIN)  IV  1 g Intravenous Q24H  . diphenhydrAMINE  50 mg Oral Once  . docusate sodium  100 mg Oral BID  . famotidine  20 mg Oral Once  . heparin  5,000 Units Subcutaneous 3 times per day  . insulin aspart  0-5 Units Subcutaneous QHS  . insulin aspart  0-9 Units Subcutaneous TID WC  . methylPREDNISolone (SOLU-MEDROL) injection  40 mg Intravenous Once   Assessment: EB is a 75yo female admitted with wheezing, SOB, and chest congestion secondary to CAP vs. PE. Pharmacy consulted to renally adjust her medications.  Plan:  Pt's  current medications do not require renal dose adjustments at this point. Pharmacy will continue to monitor and adjust medications as needed.  Vena Rua 06/26/2015,10:25 AM

## 2015-06-26 NOTE — ED Provider Notes (Signed)
[facility] Emergency Department Provider Note  ____________________________________________  Time seen: Approximately 5:50 AM  I have reviewed the triage vital signs and the nursing notes.   HISTORY  Chief Complaint Shortness of Breath and Chest Pain    HPI Pamela Collier is a 75 y.o. female who comes into the hospital today with some shortness of breath. The patient reports that she has been wheezing since Sunday and using her breathing machine twice a day but it has not been helpful. The patient reports that she has lung problems. 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 The patient reports that she also started coughing Sunday night. The patient reports that she has some chest pain that hurts in the left side of her chest intermittently. She has some sharp pain that comes and goes and is gone right now. The patient had a headache yesterday and a little bit of lightheadedness. She reports that she recently traveled from [place] but lives here. There was a person who was coughing she says in the truck they were in but the patient was coughing at that time as well. The patient is here for evaluation of her symptoms.   Past Medical History  Diagnosis Date  . Diabetes mellitus   . 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 Cardiomyopathy, ischemic Jan 2013    EF 35 to 40% [facility])  . Hepatic steatosis     by CT abd pelvis  . Osteoporosis, post-menopausal   . 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 Atherosclerotic peripheral vascular disease with gangrene [facility]) august 2012  . Hypertension   . Hyperlipidemia   . Sarcoidosis ([place])   . CHF (congestive heart failure) ([place])   . Renal insufficiency   . 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 CCF (congestive  cardiac failure) ([place]) 08/17/2013  . Pleural effusion 10/25/2012    10/2012 CT chest >> small to moderate R lung effusion>> chylothorax, 100% lymphs 10/2013 thoracentesis> cytology negative, WBC 1471, > 90% "small lymphs" 01/2014 CT chest> near complete resolution of pleural effusion, stable lymphadenopathy 08/2014 CT chest [place] ([facility]): small right sided effusion decreased in size, stable mediastinal lymphadenopathy 1.0cm largest, 76m . 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 Acute respiratory failure ([facility] 11/21/2014  . Diabetic diarrhea ([place] 10/03/2014  . DM type 2, uncontrolled, with renal complications ([place] 86/0/7371 . Cerebral infarct ([place] 08/17/2013  . Arthritis 08/05/2013  . 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 Hyperlipidemia LDL goal <100 02/23/2014  . COPD (chronic obstructive pulmonary disease) ([facility]     Patient Active Problem List   Diagnosis Date Noted  . Diarrhea 05/15/2015  . Fall 04/19/2015  . Acute on chronic kidney failure ([place] 04/19/2015  . Chronic diastolic CHF (congestive heart failure) ([place] 04/18/2015  . Diabetic neuropathy ([facility] 02/21/2015  . Benign neoplasm of sigmoid colon   . UTI (urinary tract infection) 10/04/2014  . Diabetic diarrhea ([place] 10/03/2014  . Low back  pain with radiation 04/04/2014  . Essential hypertension 04/04/2014  . Hyperlipidemia LDL goal <100 02/23/2014  . Long-term use of high-risk medication 02/23/2014  . Dermatophytic onychia 08/17/2013  . Angina pectoris ([place]) 08/17/2013  . Arteriosclerosis of coronary artery 08/17/2013  . Cerebral infarct ([place]) 08/17/2013  . Bony exostosis 08/17/2013  . Arthritis 08/05/2013  . Unsteady gait 03/24/2013  . History of colonic polyps 02/20/2013  .  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 Dysphagia 12/07/2012  . Pleural effusion 10/25/2012  . Cough 10/25/2012  . Pulmonary nodule seen on imaging study 05/20/2012  . 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 Cardiomyopathy, ischemic   . Hepatic steatosis   . Osteoporosis, post-menopausal   . 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 DM (diabetes mellitus), type 2, uncontrolled, with renal complications ([place]) 74/25/9563  . Double vessel coronary artery disease 12/14/2011    Past Surgical History  Procedure Laterality Date  . Abdominal hysterectomy      at ge 39. secondary to bleeding  . Toe amputation  Sept 2012    Right 2nd toe, [place]  . Ptca  August 2012    Right Posterior tibial artery , [place]  . Colonoscopy with propofol N/A 01/09/2015    Procedure: COLONOSCOPY WITH PROPOFOL;  Surgeon: Lucilla Lame, MD;  Location: [facility] ENDOSCOPY;  Service: Endoscopy;  Laterality: N/A;  . Cholecystectomy    . Carpal tunnel release Bilateral   . Breast cyst aspiration Right   . Coronary angioplasty with stent placement      [place]; [facility]    Current Outpatient Rx  Name  Route  Sig  Dispense  Refill  . albuterol (PROVENTIL) (2.5 MG/3ML) 0.083% nebulizer solution   Nebulization   Take 3 mLs (2.5 mg total) by nebulization every 6 (six) hours as needed for wheezing or shortness of breath.   150 mL   1   . alendronate (FOSAMAX) 70 MG tablet      Take with a full glass of water on an empty stomach every Sunday  Take with a full glass of water and take no other meds or food for   30 minutes .   12 tablet   1   . ALPRAZolam (XANAX) 0.5 MG tablet   Oral   Take 1 tablet (0.5 mg total) by mouth daily as needed for sleep or anxiety.   90 tablet   0   . aspirin 81 MG EC tablet   Oral   Take 1 tablet (81 mg total) by mouth daily. Swallow whole.   30 tablet   12   .  benzonatate (TESSALON) 100 MG capsule      TAKE TWO CAPSULES BY MOUTH THREE TIMES DAILY AS NEEDED FOR COUGH   90 capsule   0   . benzonatate (TESSALON) 200 MG capsule   Oral   Take 1 capsule (200 mg total) by mouth 3 (three) times daily as needed for cough. Reported on 05/17/2015   20 capsule   1   . Blood Glucose Monitoring Suppl (ACCU-CHEK AVIVA) device      Use as instructed   1 each   0     DX code:E 11.29   . Blood Glucose Monitoring Suppl (TRUERESULT BLOOD GLUCOSE) W/DEVICE KIT   Does not apply   1 kit by Does not apply route 3 (three) times daily.   1 each   0  DX : 250.00   . BRILINTA 90 MG TABS tablet   Oral   Take 1 tablet (90 mg total) by mouth 2 (two) times daily.   180 tablet   2     Dispense as written.   . budesonide-formoterol (SYMBICORT) 160-4.5 MCG/ACT inhaler   Inhalation   Inhale 2 puffs into the lungs 2 (two) times daily.   1 Inhaler   0   . COMBIVENT RESPIMAT 20-100 MCG/ACT AERS respimat   Inhalation   Inhale 1 puff into the lungs 3 (three) times daily as needed. Patient taking differently: Inhale 1 puff into the lungs 3 (three) times daily as needed for wheezing or shortness of breath.    2 Inhaler   3     Dispense as written.   . docusate sodium (COLACE) 100 MG capsule   Oral   Take 100 mg by mouth daily as needed for mild constipation. Reported on 05/25/2015         . ergocalciferol (DRISDOL) 50000 units capsule   Oral   Take 1 capsule (50,000 Units total) by mouth once a week. With _0  units before breakfast,  60 units before dinner.  NEEDS 15 PENS PER MONTH   105 mL   2   . Insulin Pen Needle (B-D UF III MINI PEN NEEDLES) 31G X 5 MM MISC      Use  Three times daily   300 each   3   . isosorbide mononitrate (IMDUR) 30 MG 24 hr tablet      TAKE 1 TABLET DAILY   90 tablet   1   . Lancet Devices (ACCU-CHEK SOFTCLIX) lancets      Utilize lancets three times a day to check blood glucose levels.   1 each   11     DX code:E 11.29   . losartan (COZAAR) 100 MG tablet   Oral   Take 1 tablet (100 mg total) by mouth daily.   90 tablet   2   . LYRICA 100 MG capsule      TAKE ONE CAPSULE BY MOUTH THREE TIMES DAILY   270 capsule   0   . metoprolol tartrate (LOPRESSOR) 12.5 mg TABS tablet   Oral   Take 12.5 mg by mouth 2 (two) times daily. Hold for SBP <110 and HR <60/min         . nitroGLYCERIN (NITROSTAT) 0.4 MG SL tablet   Sublingual   Place 1 tablet (0.4 mg total) under the tongue every 5 (five) minutes as needed for chest pain.   25 tablet   3   . PARoxetine (PAXIL) 10 MG tablet   Oral   Take 1 tablet (10 mg total) by mouth daily.   [place]  tablet   1   . rosuvastatin (CRESTOR) 20 MG tablet   Oral   Take 1 tablet (20 mg total) by mouth daily. Patient taking differently: Take 20 mg by mouth at bedtime.    90 tablet   1   . Spacer/Aero-Holding Chambers (AEROCHAMBER MV) inhaler      Use as instructed   1 each   0   . traMADol (ULTRAM) 50 MG tablet   Oral   Take 1 tablet (50 mg total) by mouth every 6 (six) hours as needed for moderate pain. Patient not taking: Reported on 05/25/2015   120 tablet   3   . Vitamin D, Ergocalciferol, (DRISDOL) 50000 UNITS CAPS capsule   Oral   Take 50,000 Units by mouth every 7 (seven) days. Reported on 05/25/2015           Allergies Contrast media; Gabapentin; and Sulfa antibiotics  Family History  Problem Relation Age of Onset   . Heart disease Father   . Heart attack Father   . Heart disease Sister   . Heart attack Sister   . Heart disease Brother   . Heart attack Brother   . Asthma Grandchild   . Breast cancer Maternal Aunt     Social History Social History  Substance Use Topics  . Smoking status: Never Smoker   . Smokeless tobacco: Never Used  . Alcohol Use: No    Review of Systems onstitutional: No fever/chills Eyes: No visual changes. ENT: No sore throat. Cardiovascular:  chest pain. Respiratory:  shortness of breath. Gastrointestinal: No abdominal pain.  No nausea, no vomiting.  No diarrhea.  No constipation. Genitourinary: Negative for dysuria. Musculoskeletal: Negative for back pain. Skin: Negative for rash. Neurological: Dizziness  10-point ROS otherwise negative.  ____________________________________________   PHYSICAL EXAM:  VITAL SIGNS: ED Triage Vitals  Enc Vitals Group     BP 06/26/15 0541 183/69 mmHg     Pulse Rate 06/26/15 0541 72     Resp 06/26/15 0541 20     Temp 06/26/15 0541 98 F (36.7 C)     Temp Source 06/26/15 0541 Oral     SpO2 06/26/15 0541 97 %     Weight 06/26/15 0541 188 lb 11.2 oz (85.594 kg)     Height 06/26/15 0541 _0  (1.676 m)     Head Cir --      Peak Flow --      Pain Score 06/26/15 0540 8     Pain Loc --      Pain Edu? --      Excl. in [place]? --     Constitutional: Alert and oriented. Well appearing and in mild distress. Eyes: Conjunctivae are normal. PERRL. EOMI. Head: Atraumatic. Nose: No congestion/rhinnorhea. Mouth/Throat: Mucous membranes are moist.  Oropharynx non-erythematous. Cardiovascular: Normal rate, regular rhythm. Grossly normal heart sounds.  Good peripheral circulation. Respiratory: Normal respiratory effort.  No retractions. Diminished breath sounds throughout all lung fields Gastrointestinal: Soft and nontender. No distention. Positive bowel sounds Musculoskeletal: No lower extremity tenderness nor edema.  Neurologic:   Normal speech and language.  Skin:  Skin is warm, dry and intact.  Psychiatric: Mood and affect are normal.   ____________________________________________   LABS (all labs ordered are listed, but only abnormal results are displayed)  Labs Reviewed  BASIC METABOLIC PANEL - Abnormal; Notable for the following:    Glucose, Bld 178 (*)    BUN 24 (*)    Creatinine, Ser 1.32 (*)  GFR calc non Af Amer 38 (*)    GFR calc Af Amer 45 (*)    All other components within normal limits  CBC - Abnormal; Notable for the following:    Hemoglobin 11.3 (*)    HCT 34.2 (*)    RDW 15.6 (*)    All other components within normal limits  TROPONIN I - Abnormal; Notable for the following:    Troponin I 0.05 (*)    All other components within normal limits  BRAIN NATRIURETIC PEPTIDE - Abnormal; Notable for the following:    B Natriuretic Peptide 351.0 (*)    All other components within normal limits  BLOOD GAS, VENOUS - Abnormal; Notable for the following:    pH, Ven 7.44 (*)    pCO2, Ven 38 (*)    All other components within normal limits  CULTURE, BLOOD (ROUTINE X 2)  CULTURE, BLOOD (ROUTINE X 2)  FIBRIN DERIVATIVES D-DIMER ([facility] ONLY)   ____________________________________________  EKG  ED ECG REPORT I, Loney Hering, the attending physician, personally viewed and interpreted this ECG.   Date: 06/26/2015  EKG Time: 542  Rate: 69  Rhythm: normal sinus rhythm  Axis: left axis deviation  Intervals:right bundle branch block  ST&T Change: flipped t waves I, avl, same as 01/2015  ____________________________________________  RADIOLOGY  CXR: New opacity in the right infrahilar region could represent shallow inspiration crowding, developing mass or pneumonia.  ____________________________________________   PROCEDURES  Procedure(s) performed: None  Critical Care performed: No  ____________________________________________   INITIAL IMPRESSION / ASSESSMENT AND PLAN / ED  COURSE  Pertinent labs & imaging results that were available during my care of the patient were reviewed by me and considered in my medical decision making (see chart for details).  This is a 75 year old female who comes into the hospital today with some chest pain. She is also having some shortness of breath. We will do a chest x-ray and give the patient a breathing treatment. I will check a VBG and then reassess the patient after her breathing treatment. I will give the patient some nitroglycerin to help with her chest pain as well as her blood pressure.  Given the new infiltrate in the patient's right infrahilar area I will treat the patient for pneumonia. She received some ceftriaxone and azithromycin. The patient though is complaining of some 8 out of 10 chest pain. I will give her some nitroglycerin as well as some aspirin. Given the patient's recent trip from [place] I will check a d-dimer and admit the patient to the hospitalist service for pretreatment and further evaluation of her symptoms. The patient has a contrast allergy. The patient will be admitted to the hospitalist service for her chest pain and pneumonia. ____________________________________________   FINAL CLINICAL IMPRESSION(S) / ED DIAGNOSES  Final diagnoses:  Community acquired pneumonia  Dyspnea  Chest pain, unspecified chest pain type      Loney Hering, MD 06/26/15 [phone]

## 2015-06-26 NOTE — ED Notes (Addendum)
Pt to room 26 via w/c with no distress noted; pt reports some wheezing this morning unrelieved by neb treatment; st nonprod cough/wheezing since Sunday with left sided CP, nonradiating; st Sunday wt 190lb and this morning 195lb; pt assisted into hosp gown and on card monitor

## 2015-06-27 DIAGNOSIS — J189 Pneumonia, unspecified organism: Secondary | ICD-10-CM

## 2015-06-27 DIAGNOSIS — I272 Other secondary pulmonary hypertension: Secondary | ICD-10-CM

## 2015-06-27 LAB — GLUCOSE, CAPILLARY
GLUCOSE-CAPILLARY: 182 mg/dL — AB (ref 65–99)
GLUCOSE-CAPILLARY: 154 mg/dL — AB (ref 65–99)
Glucose-Capillary: 275 mg/dL — ABNORMAL HIGH (ref 65–99)
Glucose-Capillary: 419 mg/dL — ABNORMAL HIGH (ref 65–99)

## 2015-06-27 LAB — TROPONIN I

## 2015-06-27 MED ORDER — INSULIN ASPART 100 UNIT/ML ~~LOC~~ SOLN
20.0000 [IU] | Freq: Three times a day (TID) | SUBCUTANEOUS | Status: DC
  Administered 2015-06-27 – 2015-06-28 (×3): 20 [IU] via SUBCUTANEOUS
  Filled 2015-06-27 (×4): qty 20

## 2015-06-27 MED ORDER — INSULIN ASPART PROT & ASPART (70-30 MIX) 100 UNIT/ML ~~LOC~~ SUSP: 65.0000 [IU] | Freq: Two times a day (BID) | SUBCUTANEOUS | Status: DC

## 2015-06-27 MED ORDER — INSULIN GLARGINE 100 UNIT/ML ~~LOC~~ SOLN
70.0000 [IU] | Freq: Every day | SUBCUTANEOUS | Status: DC
  Administered 2015-06-27: 70 [IU] via SUBCUTANEOUS
  Filled 2015-06-27 (×4): qty 0.7

## 2015-06-27 MED ORDER — INSULIN ASPART 100 UNIT/ML ~~LOC~~ SOLN
0.0000 [IU] | Freq: Three times a day (TID) | SUBCUTANEOUS | Status: DC
  Administered 2015-06-27: 15 [IU] via SUBCUTANEOUS
  Administered 2015-06-27: 3 [IU] via SUBCUTANEOUS
  Administered 2015-06-27: 8 [IU] via SUBCUTANEOUS
  Administered 2015-06-28: 2 [IU] via SUBCUTANEOUS
  Administered 2015-06-29: 3 [IU] via SUBCUTANEOUS
  Administered 2015-06-29: 5 [IU] via SUBCUTANEOUS
  Filled 2015-06-27: qty 8
  Filled 2015-06-27: qty 15
  Filled 2015-06-27: qty 3
  Filled 2015-06-27: qty 5
  Filled 2015-06-27: qty 2
  Filled 2015-06-27: qty 3

## 2015-06-27 MED ORDER — INSULIN ASPART 100 UNIT/ML ~~LOC~~ SOLN: 0.0000 [IU] | Freq: Every day | SUBCUTANEOUS | Status: DC

## 2015-06-27 NOTE — Telephone Encounter (Signed)
Mailed unread message to patient.  

## 2015-06-27 NOTE — Consult Note (Addendum)
[name]      Assessment and Plan:  Acute respiratory failure, likely due to acute bronchitis, pleural effusion as well as pulmonary vascular congestion from volume overload.  Right pleural effusion. I suspect this is secondary to volume overload versus a recurrent chylothorax, which she has had in the past. We'll await results of the thoracentesis, which has been ordered for radiology. I have added Triglycerides to the labs to be sent for this test.  Acute bronchitis. Patient appears to be doing better, she is been started on ceftriaxone and azithromycin, this can be de-escalated.  Pulmonary hypertension, pulmonary vascular congestion. The patient appears to have evidence of pulmonary hypertension on CT scan. She is continuing to receive Lasix, can continue normal days.  The patient is otherwise doing much better, currently her oxygen saturation is 98-100 % on room air. The patient can likely be discharged from respiratory standpoint, she is given my card and asked to follow-up in our office in 2-4 weeks.  Date: 06/27/2015  MRN# [id number] Pamela Collier [birth year]  Referring Physician: Dr. [name] is a 75 y.o. old female seen in consultation for chief complaint of:    Chief Complaint  Patient presents with  . Shortness of Breath  . Chest Pain    HPI:   The patient is a 75 year old female who was last seen in our pulmonary clinic in October 2016, at that time she was seen by Dr. Lake Bells. She is known to have a history of sarcoidosis, we decided lymphadenopathy, as well as recurrent right-sided chylothorax. She also has a history of asthma with occasional wheezing. At last visit was noted that she was using Combivent when necessary, which appeared to help. She was also started empirically on Symbicort 2 puffs twice daily, and asked to hold the Combivent at that time. She has a history of ischemic cardiomyopathy  She presented to  the hospital with complaints of cough, wheezing and shortness of breath for 2 days as well as left-sided chest pain. She underwent a CT of the chest to rule out pulmonary embolism. She was given IV steroids due to contrast allergy. On review of the CT of the chest performed on 06/26/2015, there appears to be pulmonary vascular congestion, evidence of pulmonary hypertension moderate right pleural effusion with compressive atelectasis, no evidence of pneumonia.   Echocardiogram 04/18/15: EF 55%. Venous Doppler studies 06/26/15: Negative for DVT in both lower extremity's. CPAP titration study 12/14/2013: Supine REM achieved at a CPAP of 6, CPAP was further titrated upwards to 8 with a complete resolution of apneas at that level.  PMHX:   Past Medical History  Diagnosis Date  . Diabetes mellitus   . 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 Cardiomyopathy, ischemic Jan 2013    EF 35 to 40% [facility])  . Hepatic steatosis     by CT abd pelvis  . Osteoporosis, post-menopausal   . 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 Atherosclerotic peripheral vascular disease with gangrene [facility]) august 2012  . Hypertension   . Hyperlipidemia   . Sarcoidosis ([place])   . CHF (congestive heart failure) ([place])   . Renal insufficiency   . 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 CCF (congestive cardiac failure) ([place]) 08/17/2013  . Pleural effusion 10/25/2012  10/2012 CT chest >> small to moderate R lung effusion>> chylothorax, 100% lymphs 10/2013 thoracentesis> cytology negative, WBC 1471, > 90% "small lymphs" 01/2014 CT chest> near complete resolution of pleural effusion, stable lymphadenopathy 08/2014 CT chest [place] ([facility]): small right sided effusion decreased in size, stable mediastinal lymphadenopathy 1.0cm largest, 17m  . 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 Acute respiratory failure ([place]) 11/21/2014  . Diabetic diarrhea ([place]) 10/03/2014  . DM type 2, uncontrolled, with renal complications ([place]) [id number]  . Cerebral infarct ([place]) 08/17/2013  . Arthritis 08/05/2013  . 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 Hyperlipidemia LDL goal <100 02/23/2014  . COPD (chronic obstructive pulmonary disease) ([place])    Surgical Hx:  Past Surgical History  Procedure Laterality Date  . Abdominal hysterectomy      at ge 39. secondary to bleeding  . Toe amputation  Sept 2012    Right 2nd toe, [place]  . Ptca  August 2012    Right Posterior tibial artery , [place]  . Colonoscopy with propofol N/A 01/09/2015    Procedure: COLONOSCOPY WITH PROPOFOL;  Surgeon: Lucilla Lame, MD;  Location: [facility] ENDOSCOPY;  Service: Endoscopy;  Laterality: N/A;  . Cholecystectomy    . Carpal tunnel release Bilateral   . Breast cyst aspiration Right   . Coronary angioplasty with stent placement      [place]; [facility]   Family Hx:  Family History  Problem Relation Age of Onset  . Heart disease Father   . Heart attack Father   . Heart disease Sister   . Heart attack Sister   . Heart disease Brother   . Heart attack Brother   . Asthma Grandchild   . Breast cancer Maternal Aunt    Social Hx:   Social History  Substance Use Topics  . Smoking status: Never Smoker   . Smokeless tobacco: Never Used  . Alcohol Use: No   Medication:   No current outpatient prescriptions on file.    Allergies:  Contrast media; Gabapentin; and Sulfa antibiotics  Review of Systems: Gen:  Denies  fever, sweats, chills HEENT: Denies blurred vision, double vision.  Cvc:  No dizziness, chest pain. Resp:   Denies cough or sputum porduction,    Gi: Denies swallowing difficulty, stomach pain. Gu:  Denies bladder incontinence, burning urine Ext:   No Joint pain, stiffness. Skin: No skin rash,  hives Endoc:  No polyuria, polydipsia. Psych: No depression, insomnia. Other:  All other systems were reviewed with the patient and were negative other that what is mentioned in the HPI.   Physical Examination:   VS: BP 144/52 mmHg  Pulse 62  Temp(Src) 98.6 F (37 C) (Oral)  Resp 19  Ht 5\' 6"  (1.676 m)  Wt 205 lb (92.987 kg)  BMI 33.10 kg/m2  SpO2 98%  General Appearance: No distress  Neuro:without focal findings,  speech normal,  HEENT: PERRLA, EOM intact.   Pulmonary: Decreased air entry in the right base. CardiovascularNormal S1,S2.  No m/r/g.   Abdomen: Benign, Soft, non-tender. Renal:  No costovertebral tenderness  GU:  No performed at this time. Endoc: No evident thyromegaly, no signs of acromegaly. Skin:   warm, no rashes,  no ecchymosis  Extremities: normal, no cyanosis, clubbing.  Other findings:    LABORATORY PANEL:   CBC  Recent Labs Lab 06/26/15 0545  WBC 9.8  HGB 11.3*  HCT 34.2*  PLT 272   ------------------------------------------------------------------------------------------------------------------  Chemistries   Recent Labs Lab 06/26/15 0545  NA 141  K 3.5  CL 107  CO2 26  GLUCOSE 178*  BUN 24*  CREATININE 1.32*  CALCIUM 9.4   ------------------------------------------------------------------------------------------------------------------  Cardiac Enzymes  Recent Labs Lab 06/27/15 1412  TROPONINI <0.03   ------------------------------------------------------------  RADIOLOGY:  Ct Angio Chest Pe W/cm &/or Wo Cm  06/26/2015  CLINICAL DATA:  Chest pain and shortness of breath. EXAM: CT ANGIOGRAPHY CHEST WITH CONTRAST TECHNIQUE: Multidetector CT imaging of the chest was performed using the standard protocol during bolus administration of intravenous contrast. Multiplanar CT  image reconstructions and MIPs were obtained to evaluate the vascular anatomy. CONTRAST:  33mL OMNIPAQUE IOHEXOL 350 MG/ML SOLN COMPARISON:  CT scan of January 11, 2015. FINDINGS: No pneumothorax is noted. Moderate right pleural effusion is noted with adjacent subsegmental atelectasis in the right lower lobe. Stable 5.5 mm nodule seen in inferior portion of the lingula best seen on image number 77 of series 7. There is no evidence of large central pulmonary embolus. However, due to respiratory motion artifact, evaluation of the more peripheral branches in the lower lobes is limited, and smaller emboli cannot be excluded. Atherosclerosis of thoracic aorta is noted without aneurysm or dissection. Stable mediastinal adenopathy is noted, most likely reactive or inflammatory in etiology. Adenopathy is also seen in the visualized portion of the upper abdomen which was present on prior exam is well. Review of the MIP images confirms the above findings. IMPRESSION: No evidence of large central pulmonary embolus. However, due to respiratory motion artifact, evaluation of the lower lobe branches bilaterally is limited, and therefore smaller peripheral pulmonary emboli cannot be excluded on the basis of this exam. Moderate right pleural effusion is noted with adjacent subsegmental atelectasis seen in the right lung base. Stable adenopathy is noted in mediastinum and upper abdomen which may be related to sarcoidosis, or other inflammation. Stable 5.5 mm nodule seen in lingular segment of left lower lobe compared to prior exam. Followup unenhanced chest CT scan in 12 months is recommended. Electronically Signed   By: Marijo Conception, M.D.   On: 06/26/2015 16:34   US Venous Img Lower Bilateral  06/26/2015  CLINICAL DATA:  Calf pain. Shortness of breath. Elevated D-dimer. History diabetes. Post fall onto the right leg during December of 2016. Evaluate for DVT. EXAM: BILATERAL LOWER EXTREMITY VENOUS DOPPLER ULTRASOUND  TECHNIQUE: Gray-scale sonography with graded compression, as well as color Doppler and duplex ultrasound were performed to evaluate the lower extremity deep venous systems from the level of the common femoral vein and including the common femoral, femoral, profunda femoral, popliteal and calf veins including the posterior tibial, peroneal and gastrocnemius veins when visible. The superficial great saphenous vein was also interrogated. Spectral Doppler was utilized to evaluate flow at rest and with distal augmentation maneuvers in the common femoral, femoral and popliteal veins. COMPARISON:  Bilateral lower extremity venous Doppler ultrasound - 11/14/2013; left lower extremity venous Doppler ultrasound - 05/06/2012; 05/21/2007 FINDINGS: RIGHT LOWER EXTREMITY Common Femoral Vein: No evidence of thrombus. Normal compressibility, respiratory phasicity and response to augmentation. Saphenofemoral Junction: No evidence of thrombus. Normal compressibility and flow on color Doppler imaging. Profunda Femoral Vein: No evidence of thrombus. Normal compressibility and flow on color Doppler imaging.  Femoral Vein: No evidence of thrombus. Normal compressibility, respiratory phasicity and response to augmentation. Popliteal Vein: No evidence of thrombus. Normal compressibility, respiratory phasicity and response to augmentation. Calf Veins: No evidence of thrombus. Normal compressibility and flow on color Doppler imaging. Superficial Great Saphenous Vein: No evidence of thrombus. Normal compressibility and flow on color Doppler imaging. Venous Reflux:  None. Other Findings:  None. LEFT LOWER EXTREMITY Common Femoral Vein: No evidence of thrombus. Normal compressibility, respiratory phasicity and response to augmentation. Saphenofemoral Junction: No evidence of thrombus. Normal compressibility and flow on color Doppler imaging. Profunda Femoral Vein: No evidence of thrombus. Normal compressibility and flow on color Doppler imaging.  Femoral Vein: No evidence of thrombus. Normal compressibility, respiratory phasicity and response to augmentation. Popliteal Vein: No evidence of thrombus. Normal compressibility, respiratory phasicity and response to augmentation. Calf Veins: No evidence of thrombus. Normal compressibility and flow on color Doppler imaging. Superficial Great Saphenous Vein: No evidence of thrombus. Normal compressibility and flow on color Doppler imaging. Venous Reflux:  None. Other Findings:  None. IMPRESSION: No evidence of DVT within either lower extremity. Electronically Signed   By: Sandi Mariscal M.D.   On: 06/26/2015 11:11   Dg Chest Portable 1 View  06/26/2015  CLINICAL DATA:  Patient woke this morning with wheezing and difficulty breathing. Left-sided chest pain since yesterday. EXAM: PORTABLE CHEST 1 VIEW COMPARISON:  04/18/2015 FINDINGS: Shallow inspiration. Heart size and pulmonary vascularity appear normal. Since the previous study, there is increased density inferior to the right hilum. This could represent vascular crowding due to shallow inspiration, developing mass or infiltration. Given the history, this could indicate pneumonia. Followup PA and lateral chest X-ray is recommended in 3-4 weeks following trial of antibiotic therapy to ensure resolution and exclude underlying malignancy. Old left rib fractures. No pneumothorax. No blunting of costophrenic angles there IMPRESSION: New opacity in the right infrahilar region could represent shallow inspiration common developing mass, or pneumonia. Electronically Signed   By: Lucienne Capers M.D.   On: 06/26/2015 06:16       Thank  you for the consultation and for allowing [facility] to assist in the care of your patient. Our recommendations are noted above.  Please contact us if we can be of further service.   Marda Stalker, MD.  Board Certified in Internal Medicine, Pulmonary Medicine, [place], and Sleep  Medicine.  [facility]  Patricia Pesa, M.D.  Vilinda Boehringer, M.D.  Merton Border, M.D

## 2015-06-27 NOTE — Progress Notes (Signed)
Notified Dr. Bridgett Larsson of CBG of 419 ordered to give max dose of 15 units.

## 2015-06-27 NOTE — Consult Note (Signed)
ENDOCRINOLOGY CONSULTATION  REFERRING PHYSICIAN: Demetrios Loll, MD CONSULTING PHYSICIAN:  A. Lavone Orn, MD.  CHIEF COMPLAINT:  Diabetes mellitus  HISTORY OF PRESENT ILLNESS:  75 y.o. female seen in consultation for diabetes. Patient is hospitalized with rt sided pneumonia and pleural effusion. She was given SoluMedrol one time yesterday for preparation for IV contrast, as she has a contrast allergy. She has hyperglycemia with sugars in the 400s today. She has received BID NovoLog 70/30 Mix and NovoLog SSI. She is eating. Appetite is fair.   She has had type 2 diabetes for over 25 years. Home regimen per patient, is NovoLog 70/30 Mix 44 units in the morning and 60 units in the evening before meals. Not taking oral diabetes medications. 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  Hgb A1c on 06/08/15 was 7.8%.  PAST MEDICAL HISTORY:  Past Medical History  Diagnosis Date  . Diabetes mellitus   . 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 Cardiomyopathy, ischemic Jan 2013    EF 35 to 40% [facility])  . Hepatic steatosis     by CT abd pelvis  . Osteoporosis, post-menopausal   . 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 Atherosclerotic peripheral vascular disease with gangrene [facility]) august 2012  . Hypertension   . Hyperlipidemia   . Sarcoidosis ([place])   . CHF (congestive heart failure) ([place])   . Renal insufficiency   . 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 CCF (congestive cardiac failure) ([place]) 08/17/2013  . Pleural effusion 10/25/2012    10/2012 CT chest >> small to moderate R lung effusion>> chylothorax, 100% lymphs 10/2013 thoracentesis> cytology negative, WBC 1471, > 90% "small lymphs" 01/2014 CT chest> near complete resolution of pleural effusion,  stable lymphadenopathy 08/2014 CT chest [place] ([facility]): small right sided effusion decreased in size, stable mediastinal lymphadenopathy 1.0cm largest, 70m  . 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 Acute respiratory failure ([place]) 11/21/2014  . Diabetic diarrhea ([place]) 10/03/2014  . DM type 2, uncontrolled, with renal complications ([place]) [id number]  . Cerebral infarct ([place]) 08/17/2013  . Arthritis 08/05/2013  . 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 Hyperlipidemia LDL goal <100 02/23/2014  . COPD (chronic obstructive pulmonary disease) ([facility])      CURRENT MEDICATIONS:  . azithromycin  500 mg Intravenous Q24H  . cefTRIAXone (ROCEPHIN)  IV  1 g Intravenous Q24H  . docusate sodium  100 mg Oral BID  . enoxaparin (LOVENOX) injection  40 mg Subcutaneous Q24H  . fluticasone  2 spray Each Nare Daily  . furosemide  20 mg Oral Daily  . insulin aspart  0-15 Units Subcutaneous TID WC  . insulin aspart  0-5 Units Subcutaneous QHS  . insulin aspart protamine- aspart  65 Units Subcutaneous BID WC  . isosorbide mononitrate  30 mg Oral Daily  . losartan  100 mg Oral Daily  . metoprolol tartrate  12.5 mg Oral BID  . PARoxetine  10 mg Oral Daily  . pregabalin  100 mg Oral TID  . rosuvastatin  20 mg Oral QHS     SOCIAL HISTORY:  Social History  Substance Use Topics  . Smoking status: Never Smoker   . Smokeless tobacco: Never Used  . Alcohol Use: No     FAMILY HISTORY:   Family History  Problem Relation Age of Onset  . Heart disease Father   . Heart attack Father   . Heart disease Sister   . Heart attack Sister   . Heart disease Brother   . Heart attack Brother   . Asthma Grandchild   . Breast cancer Maternal Aunt      ALLERGIES:   Allergies  Allergen Reactions  . Contrast Media [Iodinated Diagnostic Agents] Itching  . Gabapentin Itching  . Sulfa Antibiotics Itching    REVIEW OF SYSTEMS:  GENERAL:  No weight loss.  No fever.  HEENT:  No blurred vision. No sore throat.  NECK:  No neck pain or dysphagia.  CARDIAC:  No chest pain or palpitation.  PULMONARY:  No cough or shortness of breath.  ABDOMEN:  No abdominal pain.  No constipation. EXTREMITIES:  No lower extremity swelling.  ENDOCRINE:  No heat or cold intolerance.  GENITOURINARY:  No dysuria or hematuria. SKIN:  No recent rash or skin changes.   PHYSICAL EXAMINATION:  BP 151/62 mmHg  Pulse 64  Temp(Src) 98.3 F (36.8 C) (Oral)  Resp 20  Ht 5\' 6"  (1.676 m)  Wt 85.594 kg (188 lb 11.2 oz)  BMI 30.47 kg/m2  SpO2 100%  GENERAL:  Well-developed AA female in NAD. HEENT:  EOMI.  Oropharynx is clear.  NECK:  Supple.  No thyromegaly.  No neck tenderness.  CARDIAC:  Regular rate and rhythm without murmur. +SEM. No arrhythmia.  PULMONARY:  Clear to auscultation bilaterally. No wheeze.  ABDOMEN:  Diffusely soft, nontender, nondistended.  EXTREMITIES:  No peripheral edema is absent. Abset rt 2nd toe due to prior amputation. SKIN:  No rash or dermatopathy. Bilateral bare foot exam shows no ulcerations.  NEUROLOGIC:  No dysarthria.  No tremor. PSYCHIATRIC:  Alert and oriented, calm, cooperative.   LABORATORY DATA:  Results for orders placed or performed during the hospital encounter of 06/26/15 (from the past 24 hour(s))  Glucose, capillary     Status: Abnormal   Collection Time: 06/26/15  5:04 PM  Result Value Ref Range   Glucose-Capillary 264 (H) 65 - 99 mg/dL  Glucose, capillary     Status: Abnormal   Collection Time: 06/26/15  8:31 PM  Result Value Ref Range   Glucose-Capillary 405 (H) 65 - 99 mg/dL  Glucose, capillary     Status: Abnormal   Collection Time: 06/26/15  8:35 PM  Result Value Ref Range   Glucose-Capillary 426 (H) 65 - 99 mg/dL   Glucose, capillary     Status: Abnormal   Collection Time: 06/27/15  7:39 AM  Result Value Ref Range   Glucose-Capillary 275 (H) 65 - 99 mg/dL  Glucose, capillary     Status: Abnormal   Collection Time: 06/27/15 11:27 AM  Result Value Ref Range   Glucose-Capillary 419 (H) 65 - 99 mg/dL     ASSESSMENT:  1. Diabetes mellitus type 2, uncontrolled on long-term insulin with hyperglycemia 2.   Diabetes mellitus type 2, with peripheral neuropathy. 3.   Diabetes mellitus type 2, with peripheral vascular disease 4.   Diabetes mellitus type 2, with stage 4 CKD  PLAN: 1. Discussed diabetes, management goals, and BG monitoring. 2. Stop NovoLog 70/30 Mix. 3.  Replace with Lantus  70 units qhs + NovoLog 20 units tid AC + NovoLog SSI 4. On discharge, consider resuimg her out-pt regimen of: NOVLOG 70/30 mIX 44 UNITS BEFORE BREAKFAST AND 60 UNITS BEFORE SUPPER  Will follow along with you.

## 2015-06-27 NOTE — Progress Notes (Signed)
[facility] [place] NAME: Jd Waltz    MR#:  [id number]  DATE OF BIRTH:  07-29-1940  SUBJECTIVE:  CHIEF COMPLAINT:   Chief Complaint  Patient presents with  . Shortness of Breath  . Chest Pain  cough, SOB and wheezing. Chest pain on left side and left breast.  REVIEW OF SYSTEMS:  CONSTITUTIONAL: No fever, fatigue or weakness.  EYES: No blurred or double vision.  EARS, NOSE, AND THROAT: No tinnitus or ear pain.  RESPIRATORY: has cough, shortness of breath, wheezing, no hemoptysis.  CARDIOVASCULAR: has chest pain, no orthopnea, edema.  GASTROINTESTINAL: No nausea, vomiting, diarrhea or abdominal pain.  GENITOURINARY: No dysuria, hematuria.  ENDOCRINE: No polyuria, nocturia,  HEMATOLOGY: No anemia, easy bruising or bleeding SKIN: No rash or lesion. MUSCULOSKELETAL: No joint pain or arthritis.   NEUROLOGIC: No tingling, numbness, weakness.  PSYCHIATRY: No anxiety or depression.   DRUG ALLERGIES:   Allergies  Allergen Reactions  . Contrast Media [Iodinated Diagnostic Agents] Itching  . Gabapentin Itching  . Sulfa Antibiotics Itching    VITALS:  Blood pressure 151/62, pulse 64, temperature 98.3 F (36.8 C), temperature source Oral, resp. rate 20, height 5\' 6"  (1.676 m), weight 85.594 kg (188 lb 11.2 oz), SpO2 100 %.  PHYSICAL EXAMINATION:  GENERAL:  75 y.o.-year-old patient lying in the bed with no acute distress. Obese. EYES: Pupils equal, round, reactive to light and accommodation. No scleral icterus. Extraocular muscles intact.  HEENT: Head atraumatic, normocephalic. Oropharynx and nasopharynx clear.  NECK:  Supple, no jugular venous distention. No thyroid enlargement, no tenderness.  LUNGS: Normal breath sounds bilaterally, no wheezing, rales,rhonchi or crepitation. No use of accessory muscles of respiration.  CARDIOVASCULAR: S1, S2 normal. No murmurs, rubs, or gallops. Tenderness on left side breast on  palpation. ABDOMEN: Soft, nontender, nondistended. Bowel sounds present. No organomegaly or mass.  EXTREMITIES: No pedal edema, cyanosis, or clubbing.  NEUROLOGIC: Cranial nerves II through XII are intact. Muscle strength 5/5 in all extremities. Sensation intact. Gait not checked.  PSYCHIATRIC: The patient is alert and oriented x 3.  SKIN: No obvious rash, lesion, or ulcer.    LABORATORY PANEL:   CBC  Recent Labs Lab 06/26/15 0545  WBC 9.8  HGB 11.3*  HCT 34.2*  PLT 272   ------------------------------------------------------------------------------------------------------------------  Chemistries   Recent Labs Lab 06/26/15 0545  NA 141  K 3.5  CL 107  CO2 26  GLUCOSE 178*  BUN 24*  CREATININE 1.32*  CALCIUM 9.4   ------------------------------------------------------------------------------------------------------------------  Cardiac Enzymes  Recent Labs Lab 06/26/15 0545  TROPONINI 0.05*   ------------------------------------------------------------------------------------------------------------------  RADIOLOGY:  Ct Angio Chest Pe W/cm &/or Wo Cm  06/26/2015  CLINICAL DATA:  Chest pain and shortness of breath. EXAM: CT ANGIOGRAPHY CHEST WITH CONTRAST TECHNIQUE: Multidetector CT imaging of the chest was performed using the standard protocol during bolus administration of intravenous contrast. Multiplanar CT image reconstructions and MIPs were obtained to evaluate the vascular anatomy. CONTRAST:  73mL OMNIPAQUE IOHEXOL 350 MG/ML SOLN COMPARISON:  CT scan of January 11, 2015. FINDINGS: No pneumothorax is noted. Moderate right pleural effusion is noted with adjacent subsegmental atelectasis in the right lower lobe. Stable 5.5 mm nodule seen in inferior portion of the lingula best seen on image number 77 of series 7. There is no evidence of large central pulmonary embolus. However, due to respiratory motion artifact, evaluation of the more peripheral branches in the  lower lobes is limited, and smaller emboli cannot be  excluded. Atherosclerosis of thoracic aorta is noted without aneurysm or dissection. Stable mediastinal adenopathy is noted, most likely reactive or inflammatory in etiology. Adenopathy is also seen in the visualized portion of the upper abdomen which was present on prior exam is well. Review of the MIP images confirms the above findings. IMPRESSION: No evidence of large central pulmonary embolus. However, due to respiratory motion artifact, evaluation of the lower lobe branches bilaterally is limited, and therefore smaller peripheral pulmonary emboli cannot be excluded on the basis of this exam. Moderate right pleural effusion is noted with adjacent subsegmental atelectasis seen in the right lung base. Stable adenopathy is noted in mediastinum and upper abdomen which may be related to sarcoidosis, or other inflammation. Stable 5.5 mm nodule seen in lingular segment of left lower lobe compared to prior exam. Followup unenhanced chest CT scan in 12 months is recommended. Electronically Signed   By: Marijo Conception, M.D.   On: 06/26/2015 16:34   US Venous Img Lower Bilateral  06/26/2015  CLINICAL DATA:  Calf pain. Shortness of breath. Elevated D-dimer. History diabetes. Post fall onto the right leg during December of 2016. Evaluate for DVT. EXAM: BILATERAL LOWER EXTREMITY VENOUS DOPPLER ULTRASOUND TECHNIQUE: Gray-scale sonography with graded compression, as well as color Doppler and duplex ultrasound were performed to evaluate the lower extremity deep venous systems from the level of the common femoral vein and including the common femoral, femoral, profunda femoral, popliteal and calf veins including the posterior tibial, peroneal and gastrocnemius veins when visible. The superficial great saphenous vein was also interrogated. Spectral Doppler was utilized to evaluate flow at rest and with distal augmentation maneuvers in the common femoral, femoral and  popliteal veins. COMPARISON:  Bilateral lower extremity venous Doppler ultrasound - 11/14/2013; left lower extremity venous Doppler ultrasound - 05/06/2012; 05/21/2007 FINDINGS: RIGHT LOWER EXTREMITY Common Femoral Vein: No evidence of thrombus. Normal compressibility, respiratory phasicity and response to augmentation. Saphenofemoral Junction: No evidence of thrombus. Normal compressibility and flow on color Doppler imaging. Profunda Femoral Vein: No evidence of thrombus. Normal compressibility and flow on color Doppler imaging. Femoral Vein: No evidence of thrombus. Normal compressibility, respiratory phasicity and response to augmentation. Popliteal Vein: No evidence of thrombus. Normal compressibility, respiratory phasicity and response to augmentation. Calf Veins: No evidence of thrombus. Normal compressibility and flow on color Doppler imaging. Superficial Great Saphenous Vein: No evidence of thrombus. Normal compressibility and flow on color Doppler imaging. Venous Reflux:  None. Other Findings:  None. LEFT LOWER EXTREMITY Common Femoral Vein: No evidence of thrombus. Normal compressibility, respiratory phasicity and response to augmentation. Saphenofemoral Junction: No evidence of thrombus. Normal compressibility and flow on color Doppler imaging. Profunda Femoral Vein: No evidence of thrombus. Normal compressibility and flow on color Doppler imaging. Femoral Vein: No evidence of thrombus. Normal compressibility, respiratory phasicity and response to augmentation. Popliteal Vein: No evidence of thrombus. Normal compressibility, respiratory phasicity and response to augmentation. Calf Veins: No evidence of thrombus. Normal compressibility and flow on color Doppler imaging. Superficial Great Saphenous Vein: No evidence of thrombus. Normal compressibility and flow on color Doppler imaging. Venous Reflux:  None. Other Findings:  None. IMPRESSION: No evidence of DVT within either lower extremity. Electronically  Signed   By: Sandi Mariscal M.D.   On: 06/26/2015 11:11   Dg Chest Portable 1 View  06/26/2015  CLINICAL DATA:  Patient woke this morning with wheezing and difficulty breathing. Left-sided chest pain since yesterday. EXAM: PORTABLE CHEST 1 VIEW COMPARISON:  04/18/2015 FINDINGS: Shallow inspiration.  Heart size and pulmonary vascularity appear normal. Since the previous study, there is increased density inferior to the right hilum. This could represent vascular crowding due to shallow inspiration, developing mass or infiltration. Given the history, this could indicate pneumonia. Followup PA and lateral chest X-ray is recommended in 3-4 weeks following trial of antibiotic therapy to ensure resolution and exclude underlying malignancy. Old left rib fractures. No pneumothorax. No blunting of costophrenic angles there IMPRESSION: New opacity in the right infrahilar region could represent shallow inspiration common developing mass, or pneumonia. Electronically Signed   By: Lucienne Capers M.D.   On: 06/26/2015 06:16    EKG:   Orders placed or performed during the hospital encounter of 06/26/15  . ED EKG  . ED EKG  . EKG 12-Lead  . EKG 12-Lead    ASSESSMENT AND PLAN:   Right side PNA,  continue Zithromax and rocephin, f/u blood culture. CT angio with contrast allergy protocol showed no obvious PE but right moderate pleural effusion. Venous dupplex: no DVT.  Right moderate pleural effusion. Pt has h/o right moderate pleural effusion. Pulmonary consult. Thoracentesis in am. Hold ASA and brilinta.  Left side chest pain (on left breast),  tenderness on palpation.  Elevated troponin and CAD. Hold ASA and brilinta for now, continue crestor. F/u troponin.  COPD. Duoneb prn. Sarcoidosis  CKD stage 3. Stable. HTN. Continue HTN medication. DM, uncontrolled. Increase to moderate sliding scale. increase 70/30 from 60 to 65 unints SQ bid. Endo consult.   All the records are reviewed and case discussed  with Care Management/Social Workerr. Management plans discussed with the patient, her son and they are in agreement.  CODE STATUS: Full code.  TOTAL TIME TAKING CARE OF THIS PATIENT: 43 minutes.  Greater than 50% time was spent on coordination of care and face-to-face counseling.  POSSIBLE D/C IN 2 DAYS, DEPENDING ON CLINICAL CONDITION.   Demetrios Loll M.D on 06/27/2015 at 1:34 PM  Between 7am to 6pm - Pager - [phone]  After 6pm go to [email or web address] - password [email or web address] [facility]  Office  [phone]  CC: Primary care physician; [name], MD

## 2015-06-27 NOTE — Care Management (Signed)
Spoke with patient regarding discharge planning. She states she is from home with her husband whom drives her to appointments. 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 She states she is now using a cane for ambulation assistance but also has a walker. She requests a bedside commode which has been requested from Will with [facility]. NEED RX.  I found out that [facility] was following her and closed her case 06/14/15. RNCM will continue to follow.

## 2015-06-28 ENCOUNTER — Inpatient Hospital Stay: Payer: Medicare Other

## 2015-06-28 ENCOUNTER — Telehealth: Payer: Self-pay | Admitting: Internal Medicine

## 2015-06-28 DIAGNOSIS — R079 Chest pain, unspecified: Secondary | ICD-10-CM | POA: Diagnosis not present

## 2015-06-28 LAB — BODY FLUID CELL COUNT WITH DIFFERENTIAL
EOS FL: 0 %
Lymphs, Fluid: 90 %
MONOCYTE-MACROPHAGE-SEROUS FLUID: 8 %
Neutrophil Count, Fluid: 2 %
Other Cells, Fluid: 0 %
Total Nucleated Cell Count, Fluid: 1692 cu mm

## 2015-06-28 LAB — PROTEIN, BODY FLUID: Total protein, fluid: <3 g/dL

## 2015-06-28 LAB — GLUCOSE, CAPILLARY
GLUCOSE-CAPILLARY: 147 mg/dL — AB (ref 65–99)
Glucose-Capillary: 96 mg/dL (ref 65–99)
Glucose-Capillary: 103 mg/dL — ABNORMAL HIGH (ref 65–99)
Glucose-Capillary: 76 mg/dL (ref 65–99)

## 2015-06-28 LAB — AMYLASE, BODY FLUID: AMYLASE FL: 24 U/L

## 2015-06-28 LAB — GLUCOSE, SEROUS FLUID: GLUCOSE FL: 127 mg/dL

## 2015-06-28 LAB — LACTATE DEHYDROGENASE, PLEURAL OR PERITONEAL FLUID: LD FL: 68 U/L — AB (ref 3–23)

## 2015-06-28 LAB — TROPONIN I: Troponin I: 0.04 ng/mL — ABNORMAL HIGH (ref <0.031)

## 2015-06-28 MED ORDER — BISACODYL 5 MG PO TBEC: 10.0000 mg | DELAYED_RELEASE_TABLET | Freq: Once | ORAL | Status: DC

## 2015-06-28 MED ORDER — ASPIRIN 325 MG PO TABS
325.0000 mg | ORAL_TABLET | Freq: Once | ORAL | Status: AC
  Administered 2015-06-28: 325 mg via ORAL
  Filled 2015-06-28: qty 1

## 2015-06-28 MED ORDER — AZITHROMYCIN 250 MG PO TABS
500.0000 mg | ORAL_TABLET | Freq: Every day | ORAL | Status: DC
  Administered 2015-06-29: 500 mg via ORAL
  Filled 2015-06-28: qty 2

## 2015-06-28 MED ORDER — AZITHROMYCIN 500 MG PO TABS: 500.0000 mg | ORAL_TABLET | Freq: Every day | ORAL | Status: DC

## 2015-06-28 MED ORDER — TICAGRELOR 90 MG PO TABS
90.0000 mg | ORAL_TABLET | Freq: Two times a day (BID) | ORAL | Status: DC
  Administered 2015-06-28 – 2015-06-29 (×2): 90 mg via ORAL
  Filled 2015-06-28 (×2): qty 1

## 2015-06-28 NOTE — Care Management Important Message (Signed)
Important Message  Patient Details  [name] MRN: [id number] Date of Birth: [birth year]   Medicare Important Message Given:  Yes    Marshell Garfinkel, RN 06/28/2015, 10:28 AM

## 2015-06-28 NOTE — Progress Notes (Signed)
FSBS 76. Notified Dr Doy Hutching. Hold Lantus tonight

## 2015-06-28 NOTE — Progress Notes (Signed)
Called Dr Bridgett Larsson with update on pt condition , pt chest pain subsided , husband at bedside, cardiology consult called

## 2015-06-28 NOTE — Progress Notes (Signed)
Pt chest pain relieved with 2 nitrostat

## 2015-06-28 NOTE — Progress Notes (Signed)
Pt having sharp left chest pain points to left anterior chest , nitrostat given , Dr Bridgett Larsson notified orders received

## 2015-06-28 NOTE — Care Management (Signed)
Bedside commode delivered to patient by [facility].

## 2015-06-28 NOTE — Progress Notes (Signed)
Asked to see pt regarding chest pain. Pt states her cardiologist is Dr. Rockey Situ

## 2015-06-28 NOTE — Telephone Encounter (Signed)
Thank you.  Will follow as appropriate. 

## 2015-06-28 NOTE — Progress Notes (Signed)
Endocrinology follow up:  REFERRING PHYSICIAN: Demetrios Loll, MD CONSULTING PHYSICIAN: Atha Starks, MD.  CHIEF COMPLAINT: Diabetes mellitus  HISTORY OF PRESENT ILLNESS:  75 y.o. female seen in consultation for diabetes. Patient is hospitalized with rt sided pneumonia and pleural effusion. She developed hyperglycemia with sugars in the 400s after receiving IV solumedrol. Endocrinology is following for hyperglycemia management.   The patient was away for a procedure (thoracentesis) at the time of my visit. Therefore, she was not interviewed or examined. However, her bg trend over the past day and laboratory data was reviewed. BG has been controlled and she has had no hypoglycemia.  Scheduled Meds: . azithromycin  500 mg Intravenous Q24H  . bisacodyl  10 mg Oral Once  . cefTRIAXone (ROCEPHIN)  IV  1 g Intravenous Q24H  . docusate sodium  100 mg Oral BID  . enoxaparin (LOVENOX) injection  40 mg Subcutaneous Q24H  . fluticasone  2 spray Each Nare Daily  . furosemide  20 mg Oral Daily  . insulin aspart  0-15 Units Subcutaneous TID WC  . insulin aspart  0-5 Units Subcutaneous QHS  . insulin aspart  20 Units Subcutaneous TID WC  . insulin glargine  70 Units Subcutaneous QHS  . isosorbide mononitrate  30 mg Oral Daily  . losartan  100 mg Oral Daily  . metoprolol tartrate  12.5 mg Oral BID  . PARoxetine  10 mg Oral Daily  . pregabalin  100 mg Oral TID  . rosuvastatin  20 mg Oral QHS   Continuous Infusions:  PRN Meds:.acetaminophen, albuterol, ALPRAZolam, benzonatate, bisacodyl, docusate sodium, nitroGLYCERIN, ondansetron (ZOFRAN) IV  Filed Vitals:   06/28/15 1138 06/28/15 1145  BP: 110/56 139/60  Pulse:    Temp:    Resp: 20    Patient was not examined  Labs:  CBC Latest Ref Rng 06/26/2015 05/11/2015 05/11/2015  WBC 3.6 - 11.0 K/uL 9.8 6.1 6.1  Hemoglobin 12.0 - 16.0 g/dL 11.3(L) 10.4(A) 10.4(L)  Hematocrit 35.0 - 47.0 % 34.2(L) 31(A) 31.2(L)  Platelets 150 - 440 K/uL 272 313 313    BMP Latest Ref Rng 06/26/2015 06/08/2015 05/11/2015  Glucose 65 - 99 mg/dL 178(H) 169(H) -  BUN 6 - 20 mg/dL 24(H) 36(H) 79(A)  Creatinine 0.44 - 1.00 mg/dL 1.32(H) 1.34(H) 1.4(A)  Sodium 135 - 145 mmol/L 141 137 138  Potassium 3.5 - 5.1 mmol/L 3.5 4.7 4.2  Chloride 101 - 111 mmol/L 107 104 -  CO2 22 - 32 mmol/L 26 28 -  Calcium 8.9 - 10.3 mg/dL 9.4 9.8 -   ASSESSMENT:  1.Diabetes mellitus type 2, uncontrolled on long-term insulin with hyperglycemia  PLAN: 1. Continue the current regimen of insulin.  Lantus 70 units qhs + NovoLog 20 units tid AC + NovoLog SSI 2. Hold prandial coverage while NPO. 3. Check fingerstick BG AC/HS.  4. Modified carb diet once advanced from NPO. Patient will be seen by Dr. Gabriel Carina tomorrow if she remains inpatient.  If discharged, she should resume her outpatient regimen of Novolog 70/30  44 units before breakfast, 60 units before supper.  Atha Starks, MD [facility] Endocrinology

## 2015-06-28 NOTE — Progress Notes (Signed)
[facility] at [place] [name]    MR#:  [id number]  DATE OF BIRTH:  [birth year]  SUBJECTIVE:  CHIEF COMPLAINT:   Chief Complaint  Patient presents with  . Shortness of Breath  . Chest Pain  No SOB and wheezing. S/p thoracentesis. Chest pain on left side after the procedure.  REVIEW OF SYSTEMS:  CONSTITUTIONAL: No fever, fatigue or weakness.  EYES: No blurred or double vision.  EARS, NOSE, AND THROAT: No tinnitus or ear pain.  RESPIRATORY: has cough, shortness of breath, wheezing, no hemoptysis.  CARDIOVASCULAR: has chest pain, no orthopnea, edema.  GASTROINTESTINAL: No nausea, vomiting, diarrhea or abdominal pain.  GENITOURINARY: No dysuria, hematuria.  ENDOCRINE: No polyuria, nocturia,  HEMATOLOGY: No anemia, easy bruising or bleeding SKIN: No rash or lesion. MUSCULOSKELETAL: No joint pain or arthritis.   NEUROLOGIC: No tingling, numbness, weakness.  PSYCHIATRY: No anxiety or depression.   DRUG ALLERGIES:   Allergies  Allergen Reactions  . Contrast Media [Iodinated Diagnostic Agents] Itching  . Gabapentin Itching  . Sulfa Antibiotics Itching    VITALS:  Blood pressure 112/40, pulse 82, temperature 98.1 F (36.7 C), temperature source Oral, resp. rate 20, height 5\' 6"  (1.676 m), weight 92.987 kg (205 lb), SpO2 96 %.  PHYSICAL EXAMINATION:  GENERAL:  75 y.o.-year-old patient lying in the bed with no acute distress. Obese. EYES: Pupils equal, round, reactive to light and accommodation. No scleral icterus. Extraocular muscles intact.  HEENT: Head atraumatic, normocephalic. Oropharynx and nasopharynx clear.  NECK:  Supple, no jugular venous distention. No thyroid enlargement, no tenderness.  LUNGS: Normal breath sounds bilaterally, no wheezing, rales,rhonchi or crepitation. No use of accessory muscles of respiration.  CARDIOVASCULAR: S1, S2 normal. No murmurs, rubs, or gallops. Tenderness on left side breast on  palpation. ABDOMEN: Soft, nontender, nondistended. Bowel sounds present. No organomegaly or mass.  EXTREMITIES: No pedal edema, cyanosis, or clubbing.  NEUROLOGIC: Cranial nerves II through XII are intact. Muscle strength 5/5 in all extremities. Sensation intact. Gait not checked.  PSYCHIATRIC: The patient is alert and oriented x 3.  SKIN: No obvious rash, lesion, or ulcer.    LABORATORY PANEL:   CBC  Recent Labs Lab 06/26/15 0545  WBC 9.8  HGB 11.3*  HCT 34.2*  PLT 272   ------------------------------------------------------------------------------------------------------------------  Chemistries   Recent Labs Lab 06/26/15 0545  NA 141  K 3.5  CL 107  CO2 26  GLUCOSE 178*  BUN 24*  CREATININE 1.32*  CALCIUM 9.4   ------------------------------------------------------------------------------------------------------------------  Cardiac Enzymes  Recent Labs Lab 06/27/15 1412  TROPONINI <0.03   ------------------------------------------------------------------------------------------------------------------  RADIOLOGY:  Dg Chest Port 1 View  06/28/2015  CLINICAL DATA:  Pneumonia, pleural effusion, status post thoracentesis, CHF and cardiomyopathy. EXAM: PORTABLE CHEST 1 VIEW COMPARISON:  Portable chest x-ray and CT scan of the chest of 26 June 2015 FINDINGS: The patient is positioned in a somewhat lordotic matter today. The hemidiaphragms are visible bilaterally. A small amount of pleural fluid on the right likely persists. The cardiac silhouette is enlarged. The pulmonary vascularity is prominent centrally. The pulmonary interstitial markings are only minimally prominent. There is no pneumothorax. The observed bony thorax is unremarkable. IMPRESSION: Somewhat limited study due to mild hypo inflation. There is right basilar subsegmental atelectasis with small pleural effusion. There is no post thoracentesis pneumothorax. Cardiomegaly with mild central pulmonary  vascular congestion. Electronically Signed   By: David  Martinique M.D.   On: 06/28/2015 07:55    EKG:  Orders placed or performed during the hospital encounter of 06/26/15  . ED EKG  . ED EKG  . EKG 12-Lead  . EKG 12-Lead  . EKG 12-Lead  . EKG 12-Lead    ASSESSMENT AND PLAN:   Right side PNA with acute bronchitis,  continue Zithromax and rocephin, f/u blood culture. CT angio with contrast allergy protocol showed no obvious PE but right moderate pleural effusion. Venous dupplex: no DVT.  Right moderate pleural effusion. Pt has h/o right moderate pleural effusion. 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 S/p Thoracentesis today.  F/u lab.  Resume ASA and brilinta.  Pulmonary hypertension, pulmonary vascular congestion. Continue lasix per Dr. Felicie Morn.  Left side chest pain again. Relieved by nitro. Resume ASA, brilinta , troponin, EKG, cardiology consult.  Elevated troponin and CAD. resume ASA and brilinta, continue crestor. F/u troponin.  COPD. Duoneb prn. Sarcoidosis  CKD stage 3. Stable. HTN. Continue HTN medication. DM,  Better controlled. Increased to moderate sliding scale.  Discontinued 70/30 and started lantus and novolog tid. Change back to novolog 70/30 after discharge per Dr. Gabriel Carina.  I discussed with Dr. Felicie Morn. All the records are reviewed and case discussed with Care Management/Social Workerr. Management plans discussed with the patient, her husband and they are in agreement.  CODE STATUS: Full code.  TOTAL TIME TAKING CARE OF THIS PATIENT: 42 minutes.  Greater than 50% time was spent on coordination of care and face-to-face counseling.  POSSIBLE D/C IN 2 DAYS, DEPENDING ON CLINICAL CONDITION.   Demetrios Loll M.D on 06/28/2015 at 4:18 PM  Between 7am to 6pm - Pager - [phone]  After 6pm go to [email or web address] - password [email or web address] [facility]  Office  [phone]  CC: Primary care physician; [name], MD

## 2015-06-28 NOTE — Procedures (Signed)
Interventional Radiology Procedure Note  Procedure:  US guided right thoracentesis  Complications:  None  Estimated Blood Loss: < 10 mL  Right thoracentesis yielded 215 mL of turbid, exudative appearing fluid.  Sent for requested labs. CXR pending.  Venetia Night. Kathlene Cote, M.D Pager:  [phone]

## 2015-06-28 NOTE — Telephone Encounter (Signed)
HFU, Pt is being discharged today. Pt is scheduled for 07/05/2015 @2pm . Thank you!

## 2015-06-29 ENCOUNTER — Encounter: Payer: Self-pay | Admitting: Student

## 2015-06-29 DIAGNOSIS — J948 Other specified pleural conditions: Secondary | ICD-10-CM

## 2015-06-29 DIAGNOSIS — R079 Chest pain, unspecified: Secondary | ICD-10-CM

## 2015-06-29 DIAGNOSIS — R791 Abnormal coagulation profile: Secondary | ICD-10-CM

## 2015-06-29 DIAGNOSIS — I509 Heart failure, unspecified: Secondary | ICD-10-CM | POA: Insufficient documentation

## 2015-06-29 DIAGNOSIS — I25119 Atherosclerotic heart disease of native coronary artery with unspecified angina pectoris: Secondary | ICD-10-CM

## 2015-06-29 DIAGNOSIS — R7989 Other specified abnormal findings of blood chemistry: Secondary | ICD-10-CM

## 2015-06-29 DIAGNOSIS — J189 Pneumonia, unspecified organism: Secondary | ICD-10-CM | POA: Insufficient documentation

## 2015-06-29 DIAGNOSIS — R778 Other specified abnormalities of plasma proteins: Secondary | ICD-10-CM | POA: Insufficient documentation

## 2015-06-29 LAB — GLUCOSE, CAPILLARY
GLUCOSE-CAPILLARY: 157 mg/dL — AB (ref 65–99)
Glucose-Capillary: 240 mg/dL — ABNORMAL HIGH (ref 65–99)
Glucose-Capillary: 220 mg/dL — ABNORMAL HIGH (ref 65–99)

## 2015-06-29 LAB — BASIC METABOLIC PANEL
Anion gap: 7 (ref 5–15)
BUN: 40 mg/dL — AB (ref 6–20)
CALCIUM: 9 mg/dL (ref 8.9–10.3)
CO2: 26 mmol/L (ref 22–32)
CREATININE: 1.43 mg/dL — AB (ref 0.44–1.00)
Chloride: 107 mmol/L (ref 101–111)
GFR calc non Af Amer: 35 mL/min — ABNORMAL LOW (ref >60)
GFR, EST AFRICAN AMERICAN: 40 mL/min — AB (ref >60)
Glucose, Bld: 172 mg/dL — ABNORMAL HIGH (ref 65–99)
Potassium: 3.9 mmol/L (ref 3.5–5.1)
SODIUM: 140 mmol/L (ref 135–145)

## 2015-06-29 LAB — PH, BODY FLUID: pH, Body Fluid: 7.8

## 2015-06-29 LAB — TROPONIN I

## 2015-06-29 MED ORDER — INSULIN ASPART PROT & ASPART (70-30 MIX) 100 UNIT/ML ~~LOC~~ SUSP: 60.0000 [IU] | Freq: Every day | SUBCUTANEOUS | Status: DC

## 2015-06-29 MED ORDER — TRAMADOL HCL 50 MG PO TABS: 50.0000 mg | ORAL_TABLET | Freq: Four times a day (QID) | ORAL | Status: DC | PRN

## 2015-06-29 MED ORDER — INSULIN ASPART 100 UNIT/ML ~~LOC~~ SOLN: 10.0000 [IU] | Freq: Three times a day (TID) | SUBCUTANEOUS | Status: DC

## 2015-06-29 MED ORDER — ACETAMINOPHEN 325 MG PO TABS: 650.0000 mg | ORAL_TABLET | Freq: Four times a day (QID) | ORAL | Status: DC | PRN

## 2015-06-29 MED ORDER — ACETAMINOPHEN 325 MG PO TABS
650.0000 mg | ORAL_TABLET | Freq: Four times a day (QID) | ORAL | Status: DC | PRN
  Administered 2015-06-29: 650 mg via ORAL
  Filled 2015-06-29: qty 2

## 2015-06-29 MED ORDER — INSULIN ASPART PROT & ASPART (70-30 MIX) 100 UNIT/ML ~~LOC~~ SUSP: 44.0000 [IU] | Freq: Every day | SUBCUTANEOUS | Status: DC

## 2015-06-29 MED ORDER — INSULIN ASPART 100 UNIT/ML ~~LOC~~ SOLN
5.0000 [IU] | Freq: Three times a day (TID) | SUBCUTANEOUS | Status: DC
  Administered 2015-06-29: 5 [IU] via SUBCUTANEOUS
  Filled 2015-06-29: qty 5

## 2015-06-29 NOTE — Care Management (Signed)
Patient agrees to home health PT and would like to use [facility]. Referral to [facility]. Bedside commode has been delivered. Her husband will provide transportation to home today. No further RNCM needs. Case closed.

## 2015-06-29 NOTE — Discharge Summary (Signed)
[name] [name]    MR#:  [id number]  DATE OF BIRTH:  12-08-1940  DATE OF ADMISSION:  06/26/2015 ADMITTING PHYSICIAN: Demetrios Loll, MD  DATE OF DISCHARGE: 06/29/2015 PRIMARY CARE PHYSICIAN: [name], MD    ADMISSION DIAGNOSIS:  Dyspnea [R06.00] Community acquired pneumonia [J18.9] Chest pain, unspecified chest pain type [R07.9]   DISCHARGE DIAGNOSIS:  Right side PNA with acute bronchitis Right moderate pleural effusion Pulmonary hypertension SECONDARY DIAGNOSIS:   Past Medical History  Diagnosis Date  . Coronary artery disease, occlusive     Previous PCI to the LAD, LCx, and RCA in 2010, 2011, 2013, and 2016. All performed in [place].  . Cardiomyopathy, ischemic Jan 2013    a. EF 35 to 40% by echo in 2013 b. EF improved to 50-55% by echo in 04/2015   . Hepatic steatosis     by CT abd pelvis  . Osteoporosis, post-menopausal   . 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 Atherosclerotic peripheral vascular disease with gangrene [facility]) august 2012  . Hypertension   . Sarcoidosis ([place])   . Chronic diastolic CHF (congestive heart failure) (HCC)     a. EF 50-55% by echo in 04/2015  . Renal insufficiency   . Pleural effusion 10/25/2012    10/2012 CT chest >> small to moderate R lung effusion>> chylothorax, 100% lymphs 10/2013 thoracentesis> cytology negative, WBC 1471, > 90% "small lymphs" 01/2014 CT chest> near complete resolution of pleural effusion, stable lymphadenopathy 08/2014 CT chest [place] ([facility]): small right sided effusion decreased in size, stable mediastinal lymphadenopathy 1.0cm largest, 19m  . 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 Acute respiratory failure ([place]) 11/21/2014  . Diabetic diarrhea ([place]) 10/03/2014  . DM type 2, uncontrolled, with renal complications ([place]) [id number]  . Cerebral infarct ([place]) 08/17/2013  . Arthritis 08/05/2013  . 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 Hyperlipidemia LDL goal <100 02/23/2014  . COPD (chronic obstructive pulmonary disease) ([place])     HOSPITAL COURSE:   Right side PNA with acute bronchitis,  Treated with Zithromax and rocephin,  negative blood culture. CT angio with contrast allergy protocol showed no obvious PE but right moderate pleural effusion. Venous dupplex: no DVT.  Right moderate pleural effusion. Pt has h/o right moderate pleural effusion. 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 S/p Thoracentesis. Resumed ASA and brilinta.  Pulmonary hypertension, pulmonary vascular congestion. Continue lasix per Dr. Felicie Morn.  Left side chest pain again. Relieved by nitro. Resumed ASA, brilinta , negative troponin,  Outpatient stress test per Dr. Rockey Situ.  Elevated troponin and CAD. resumed ASA and brilinta, continue crestor.   COPD. Duoneb prn. Sarcoidosis  CKD stage 3. Stable.  HTN. Continue HTN medication. DM, controlled. Increased to moderate sliding scale. Discontinued 70/30 and started lantus and novolog tid. Change back to novolog 70/30 after discharge per Dr. Gabriel Carina.   DISCHARGE CONDITIONS:   Stable, discharge to home with HHPT.  CONSULTS OBTAINED:  Treatment Team:  Judi Cong, MD Evans Lance, MD  DRUG ALLERGIES:   Allergies  Allergen Reactions  .  Contrast Media [Iodinated Diagnostic Agents] Itching  . Gabapentin Itching  . Sulfa Antibiotics Itching    DISCHARGE MEDICATIONS:   Current Discharge Medication List    START taking these medications   Details  acetaminophen (TYLENOL) 325 MG tablet Take 2 tablets (650 mg total) by mouth every 6 (six) hours as  needed for mild pain (headache). Qty: 20 tablet, Refills: 0    azithromycin (ZITHROMAX) 500 MG tablet Take 1 tablet (500 mg total) by mouth daily. Qty: 5 tablet, Refills: 0      CONTINUE these medications which have CHANGED   Details  traMADol (ULTRAM) 50 MG tablet Take 1 tablet (50 mg total) by mouth every 6 (six) hours as needed for moderate pain. Qty: 16 tablet, Refills: 0      CONTINUE these medications which have NOT CHANGED   Details  albuterol (PROVENTIL) (2.5 MG/3ML) 0.083% nebulizer solution Take 3 mLs (2.5 mg total) by nebulization every 6 (six) hours as needed for wheezing or shortness of breath. Qty: 150 mL, Refills: 1    alendronate (FOSAMAX) 70 MG tablet Take with a full glass of water on an empty stomach every Sunday  Take with a full glass of water and take no other meds or food for   30 minutes . Qty: 12 tablet, Refills: 1    ALPRAZolam (XANAX) 0.5 MG tablet Take 1 tablet (0.5 mg total) by mouth daily as needed for sleep or anxiety. Qty: 90 tablet, Refills: 0    aspirin 81 MG EC tablet Take 1 tablet (81 mg total) by mouth daily. Swallow whole. Qty: 30 tablet, Refills: 12    benzonatate (TESSALON) 100 MG capsule TAKE TWO CAPSULES BY MOUTH THREE TIMES DAILY AS NEEDED FOR COUGH Qty: 90 capsule, Refills: 0    BRILINTA 90 MG TABS tablet Take 1 tablet (90 mg total) by mouth 2 (two) times daily. Qty: 180 tablet, Refills: 2   Associated Diagnoses: Cardiomyopathy, ischemic    budesonide-formoterol (SYMBICORT) 160-4.5 MCG/ACT inhaler Inhale 2 puffs into the lungs 2 (two) times daily. Qty: 1 Inhaler, Refills: 0    COMBIVENT RESPIMAT 20-100 MCG/ACT AERS respimat Inhale 1 puff into the lungs 3 (three) times daily as needed. Qty: 2 Inhaler, Refills: 3    docusate sodium (COLACE) 100 MG capsule Take 100 mg by mouth daily as needed for mild constipation. Reported on 05/25/2015    fluticasone (FLONASE) 50 MCG/ACT nasal spray Place 2 sprays into both nostrils daily. Qty: 16  g, Refills: 2    furosemide (LASIX) 20 MG tablet Take 1 tablet (20 mg total) by mouth daily. Qty: 90 tablet, Refills: 1   Associated Diagnoses: Cardiomyopathy, ischemic    insulin aspart protamine - aspart (NOVOLOG 70/30 MIX) (70-30) 100 UNIT/ML FlexPen 60 units before breakfast,  60 units before dinner.  NEEDS 15 PENS PER MONTH Qty: 105 mL, Refills: 2    isosorbide mononitrate (IMDUR) 30 MG 24 hr tablet TAKE 1 TABLET DAILY Qty: 90 tablet, Refills: 1    losartan (COZAAR) 100 MG tablet Take 1 tablet (100 mg total) by mouth daily. Qty: 90 tablet, Refills: 2    LYRICA 100 MG capsule TAKE ONE CAPSULE BY MOUTH THREE TIMES DAILY Qty: 270 capsule, Refills: 0    metoprolol tartrate (LOPRESSOR) 12.5 mg TABS tablet Take 12.5 mg by mouth 2 (two) times daily. Hold for SBP <110 and HR <60/min    nitroGLYCERIN (NITROSTAT) 0.4 MG SL tablet Place 1 tablet (0.4 mg total) under the tongue every 5 (five) minutes  as needed for chest pain. Qty: 25 tablet, Refills: 3    PARoxetine (PAXIL) 10 MG tablet Take 1 tablet (10 mg total) by mouth daily. Qty: 90 tablet, Refills: 1    rosuvastatin (CRESTOR) 20 MG tablet Take 1 tablet (20 mg total) by mouth daily. Qty: 90 tablet, Refills: 1    Spacer/Aero-Holding Chambers (AEROCHAMBER MV) inhaler Use as instructed Qty: 1 each, Refills: 0    ergocalciferol (DRISDOL) 50000 units capsule Take 1 capsule (50,000 Units total) by mouth once a week. With Sunday dinner Qty: 4 capsule, Refills: 0         DISCHARGE INSTRUCTIONS:   If you experience worsening of your admission symptoms, develop shortness of breath, life threatening emergency, suicidal or homicidal thoughts you must seek medical attention immediately by calling 911 or calling your MD immediately  if symptoms less severe.  You Must read complete instructions/literature along with all the possible adverse reactions/side effects for all the Medicines you take and that have been prescribed to you. Take any  new Medicines after you have completely understood and accept all the possible adverse reactions/side effects.   Please note  You were cared for by a hospitalist during your hospital stay. If you have any questions about your discharge medications or the care you received while you were in the hospital after you are discharged, you can call the unit and asked to speak with the hospitalist on call if the hospitalist that took care of you is not available. Once you are discharged, your primary care physician will handle any further medical issues. Please note that NO REFILLS for any discharge medications will be authorized once you are discharged, as it is imperative that you return to your primary care physician (or establish a relationship with a primary care physician if you do not have one) for your aftercare needs so that they can reassess your need for medications and monitor your lab values.    Today   SUBJECTIVE   No chest pain.    VITAL SIGNS:  Blood pressure 140/68, pulse 63, temperature 97.7 F (36.5 C), temperature source Oral, resp. rate 17, height 5\' 6"  (1.676 m), weight 92.987 kg (205 lb), SpO2 96 %.  I/O:   Intake/Output Summary (Last 24 hours) at 06/29/15 1627 Last data filed at 06/28/15 2039  Gross per 24 hour  Intake      0 ml  Output      0 ml  Net      0 ml    PHYSICAL EXAMINATION:  GENERAL:  75 y.o.-year-old patient lying in the bed with no acute distress. Obese. EYES: Pupils equal, round, reactive to light and accommodation. No scleral icterus. Extraocular muscles intact.  HEENT: Head atraumatic, normocephalic. Oropharynx and nasopharynx clear.  NECK:  Supple, no jugular venous distention. No thyroid enlargement, no tenderness.  LUNGS: Normal breath sounds bilaterally, no wheezing, rales,rhonchi or crepitation. No use of accessory muscles of respiration.  CARDIOVASCULAR: S1, S2 normal. No murmurs, rubs, or gallops.  ABDOMEN: Soft, non-tender, non-distended.  Bowel sounds present. No organomegaly or mass.  EXTREMITIES: No pedal edema, cyanosis, or clubbing.  NEUROLOGIC: Cranial nerves II through XII are intact. Muscle strength 5/5 in all extremities. Sensation intact. Gait not checked.  PSYCHIATRIC: The patient is alert and oriented x 3.  SKIN: No obvious rash, lesion, or ulcer.   DATA REVIEW:   CBC  Recent Labs Lab 06/26/15 0545  WBC 9.8  HGB 11.3*  HCT 34.2*  PLT 272  Chemistries   Recent Labs Lab 06/29/15 0802  NA 140  K 3.9  CL 107  CO2 26  GLUCOSE 172*  BUN 40*  CREATININE 1.43*  CALCIUM 9.0    Cardiac Enzymes  Recent Labs Lab 06/29/15 0802  TROPONINI <0.03    Microbiology Results  Results for orders placed or performed during the hospital encounter of 06/26/15  Blood culture (routine x 2)     Status: None (Preliminary result)   Collection Time: 06/26/15  6:51 AM  Result Value Ref Range Status   Specimen Description BLOOD  Final   Special Requests NONE  Final   Culture NO GROWTH 2 DAYS  Final   Report Status PENDING  Incomplete  Blood culture (routine x 2)     Status: None (Preliminary result)   Collection Time: 06/26/15  6:51 AM  Result Value Ref Range Status   Specimen Description BLOOD  Final   Special Requests NONE  Final   Culture NO GROWTH 2 DAYS  Final   Report Status PENDING  Incomplete  Body fluid culture     Status: None (Preliminary result)   Collection Time: 06/28/15 11:30 AM  Result Value Ref Range Status   Specimen Description PLEURAL  Final   Special Requests NONE  Final   Gram Stain   Final    FEW WBC SEEN MODERATE RED BLOOD CELLS NO ORGANISMS SEEN    Culture NO GROWTH < 24 HOURS  Final   Report Status PENDING  Incomplete    RADIOLOGY:  Dg Chest 1 View  06/28/2015  CLINICAL DATA:  Status post right thoracentesis. EXAM: CHEST 1 VIEW COMPARISON:  Film earlier today at 0625 hours FINDINGS: There is decrease in right pleural fluid and improved aeration at the right lung base. No  pneumothorax. Bibasilar atelectasis remains. No pulmonary edema. IMPRESSION: Decrease in right pleural effusion with improved aeration at the right lung base. No pneumothorax following thoracentesis. Electronically Signed   By: Aletta Edouard M.D.   On: 06/28/2015 17:14   Dg Chest Port 1 View  06/28/2015  CLINICAL DATA:  Pneumonia, pleural effusion, status post thoracentesis, CHF and cardiomyopathy. EXAM: PORTABLE CHEST 1 VIEW COMPARISON:  Portable chest x-ray and CT scan of the chest of 26 June 2015 FINDINGS: The patient is positioned in a somewhat lordotic matter today. The hemidiaphragms are visible bilaterally. A small amount of pleural fluid on the right likely persists. The cardiac silhouette is enlarged. The pulmonary vascularity is prominent centrally. The pulmonary interstitial markings are only minimally prominent. There is no pneumothorax. The observed bony thorax is unremarkable. IMPRESSION: Somewhat limited study due to mild hypo inflation. There is right basilar subsegmental atelectasis with small pleural effusion. There is no post thoracentesis pneumothorax. Cardiomegaly with mild central pulmonary vascular congestion. Electronically Signed   By: David  Martinique M.D.   On: 06/28/2015 07:55   US Thoracentesis Asp Pleural Space W/img Guide  06/28/2015  CLINICAL DATA:  Pneumonia and right pleural effusion. EXAM: ULTRASOUND GUIDED RIGHT THORACENTESIS COMPARISON:  Chest x-ray earlier today. PROCEDURE: An ultrasound guided thoracentesis was thoroughly discussed with the patient and questions answered. The benefits, risks, alternatives and complications were also discussed. The patient understands and wishes to proceed with the procedure. Written consent was obtained. A time-out was performed. Ultrasound was performed to localize and mark an adequate pocket of fluid in the right chest. The area was then prepped and draped in the normal sterile fashion. 1% Lidocaine was used for local anesthesia.  Under ultrasound guidance  a 6 French Safe-T-Centesis catheter was introduced. Thoracentesis was performed. The catheter was removed and a dressing applied. COMPLICATIONS: None FINDINGS: A total of approximately 215 mL of turbid, yellow colored fluid was removed. A fluid sample was sent for laboratory analysis. IMPRESSION: Successful ultrasound guided right thoracentesis yielding 215 mL of turbid pleural fluid. Electronically Signed   By: Aletta Edouard M.D.   On: 06/28/2015 17:16        Management plans discussed with the patient, family and they are in agreement.  CODE STATUS:     Code Status Orders        Start     Ordered   06/26/15 0735  Full code   Continuous     06/26/15 0736    Code Status History    Date Active Date Inactive Code Status Order ID Comments User Context   04/20/2015  1:18 AM 04/22/2015  7:18 PM Full Code [id number]  Lance Coon, MD Inpatient      TOTAL TIME TAKING CARE OF THIS PATIENT: 36 minutes.    Demetrios Loll M.D on 06/29/2015 at 4:27 PM  Between 7am to 6pm - Pager - [phone]  After 6pm go to [email or web address] - password [email or web address] [facility]  Office  [phone]  CC: Primary care physician; [name], MD

## 2015-06-29 NOTE — Progress Notes (Signed)
Patient discharging.  Have insulins from dinner and breakfast that are hindering printing of discharge instructions.  Deleting orders as patient will not be here for dinner and breakfast tomorrow.

## 2015-06-29 NOTE — Evaluation (Signed)
Physical Therapy Evaluation Patient Details [name] MRN: [id number] DOB: [birth year] Today's Date: 06/29/2015   History of Present Illness  Pt is a 75 y.o. female presenting to hospital with SOB x2 days and chest pain.  Pt found to have R moderate pleural effusion.  Pt s/p thoracentesis 06/28/15 and developed L chest pain post-op.  Imaging negative for DVT 06/26/15.  Of note, pt recently discharged home from [facility].  PMH includes COPD, lung sarcoidosis, htn, DM, CAD, cardiomyopathy, B CTR, R 2nd toe amp.  Clinical Impression  Prior to admission, pt was modified independent using SPC at home.  Pt lives with her husband in 1 level home.  Currently pt is modified independent with bed mobility and CGA with transfers and ambulation 110 feet with RW; no loss of balance noted during session with use of RW (pt unsteady without walker use).  Pt would benefit from skilled PT to address noted impairments and functional limitations.  Recommend pt discharge to home with SBA for functional mobility for safety with use of RW when medically appropriate; also recommend HHPT.     Follow Up Recommendations Home health PT;Supervision for mobility/OOB    Equipment Recommendations   (pt has own RW at home)    Recommendations for Other Services       Precautions / Restrictions Precautions Precautions: Fall Restrictions Weight Bearing Restrictions: No      Mobility  Bed Mobility Overal bed mobility: Modified Independent Bed Mobility: Supine to Sit;Sit to Supine     Supine to sit: Modified independent (Device/Increase time);HOB elevated Sit to supine: Modified independent (Device/Increase time);HOB elevated   General bed mobility comments: No difficulties getting in/out of bed  Transfers Overall transfer level: Needs assistance Equipment used: Rolling walker (2 wheeled) Transfers: Sit to/from [facility] Sit to Stand: Min guard Stand pivot transfers: Min assist;Min guard  (stand step turn bed to commode min assist without AD; CGA with RW commode to bed)       General transfer comment: strong stand with use of RW; unsteady without AD  Ambulation/Gait Ambulation/Gait assistance: Min guard Ambulation Distance (Feet): 110 Feet Assistive device: Rolling walker (2 wheeled)   Gait velocity: decreased   General Gait Details: initially "shaky" in general (pt reports having this baseline) but improved some with distance; decreased B step length/foot clearance/heelstrike; no loss of balance noted  Stairs            Wheelchair Mobility    Modified Rankin (Stroke Patients Only)       Balance Overall balance assessment: Needs assistance Sitting-balance support: Bilateral upper extremity supported;Feet supported Sitting balance-Leahy Scale: Good     Standing balance support: Bilateral upper extremity supported;During functional activity (on RW) Standing balance-Leahy Scale: Good                               Pertinent Vitals/Pain Pain Assessment: No/denies pain  Vitals stable and WFL throughout treatment session.    Home Living Family/patient expects to be discharged to:: Private residence Living Arrangements: Spouse/significant other Available Help at Discharge: Family Type of Home: House Home Access: Stairs to enter Entrance Stairs-Rails: None Entrance Stairs-Number of Steps: 1 very small raised step to enter Home Layout:  (3 steps den to [employer] area (pt does not have to access initially)) Home Equipment: [place] - 2 wheels;Cane - single point Additional Comments: BSC already delivered to pt's room    Prior Function Level of Independence:  Independent with assistive device(s)         Comments: Pt has been using SPC mostly recently.  Pt's husband drives her to appts.  Pt reports 1 fall in past 6 months (L leg gave out).     Hand Dominance        Extremity/Trunk Assessment   Upper Extremity Assessment: Generalized  weakness           Lower Extremity Assessment: Generalized weakness         Communication   Communication: No difficulties  Cognition Arousal/Alertness: Awake/alert Behavior During Therapy: WFL for tasks assessed/performed Overall Cognitive Status: Within Functional Limits for tasks assessed                      General Comments   Nursing cleared pt for participation in physical therapy.  Pt agreeable to PT session.  Pt requesting to use toilet during PT session.  Pt reporting she does not have to do the stairs to get to the kitchen (her husband can get anything she needs).    Exercises        Assessment/Plan    PT Assessment Patient needs continued PT services  PT Diagnosis Difficulty walking   PT Problem List Decreased strength;Decreased activity tolerance;Decreased balance;Decreased mobility  PT Treatment Interventions DME instruction;Gait training;Stair training;Functional mobility training;Therapeutic activities;Therapeutic exercise;Balance training;Patient/family education   PT Goals (Current goals can be found in the Care Plan section) Acute Rehab PT Goals Patient Stated Goal: to go home PT Goal Formulation: With patient Time For Goal Achievement: 07/13/15 Potential to Achieve Goals: Good    Frequency Min 2X/week   Barriers to discharge        Co-evaluation               End of Session Equipment Utilized During Treatment: Gait belt Activity Tolerance: Patient tolerated treatment well Patient left: in bed;with call bell/phone within reach;with bed alarm set Nurse Communication: Mobility status         Time: [id number] PT Time Calculation (min) (ACUTE ONLY): 27 min   Charges:   PT Evaluation $PT Eval Low Complexity: 1 Procedure PT Treatments $Therapeutic Activity: 8-22 mins   PT G CodesLeitha Bleak 07-20-15, 11:59 AM Leitha Bleak, [place]

## 2015-06-29 NOTE — Progress Notes (Signed)
Endocrinology follow up  History of present illness Pamela Collier is a 75 y.o. Female with type 2 diabetes seen in follow up for blood sugar managment. She had hyperglycemia after receiving SoluMedrol in preparation for contrast load, as she has a contrast allergy. Sugars much improved now on Lantus + NovoLog. Home regimen is NovoLog 70/30 Mix 44 units in AM and 60 units in PM, before meals. She prefers to resume the 70/30 Mix. Appetite is fair. Eating 100% of meal trays. SOB improved. No fevers.   Medical history Past Medical History  Diagnosis Date  . Coronary artery disease, occlusive     Previous PCI to the LAD, LCx, and RCA in 2010, 2011, 2013, and 2016. All performed in [place].  . Cardiomyopathy, ischemic Jan 2013    a. EF 35 to 40% by echo in 2013 b. EF improved to 50-55% by echo in 04/2015   . Hepatic steatosis     by CT abd pelvis  . Osteoporosis, post-menopausal   . 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 Atherosclerotic peripheral vascular disease with gangrene [facility]) august 2012  . Hypertension   . Sarcoidosis ([place])   . Chronic diastolic CHF (congestive heart failure) (HCC)     a. EF 50-55% by echo in 04/2015  . Renal insufficiency   . Pleural effusion 10/25/2012    10/2012 CT chest >> small to moderate R lung effusion>> chylothorax, 100% lymphs 10/2013 thoracentesis> cytology negative, WBC 1471, > 90% "small lymphs" 01/2014 CT chest> near complete resolution of pleural effusion, stable lymphadenopathy 08/2014 CT chest [place] ([facility]): small right sided effusion decreased in size, stable mediastinal lymphadenopathy 1.0cm largest, 64m  . 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 Acute respiratory failure ([place]) 11/21/2014  . Diabetic diarrhea ([place]) 10/03/2014  . DM type 2, uncontrolled, with renal complications ([place]) [id number]  . Cerebral infarct ([place]) 08/17/2013  . Arthritis 08/05/2013  . 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 Hyperlipidemia LDL goal <100 02/23/2014  . COPD (chronic obstructive pulmonary disease) [facility])      Surgical history Past Surgical History  Procedure Laterality Date  . Abdominal hysterectomy      at ge 39. secondary to bleeding  . Toe amputation  Sept 2012    Right 2nd toe, [place]  . Ptca  August 2012    Right Posterior tibial artery , [place]  . Colonoscopy with propofol N/A 01/09/2015    Procedure: COLONOSCOPY WITH PROPOFOL;  Surgeon: Lucilla Lame, MD;  Location: [facility] ENDOSCOPY;  Service: Endoscopy;  Laterality: N/A;  . Cholecystectomy    . Carpal tunnel release Bilateral   . Breast cyst aspiration Right   . Coronary angioplasty with stent placement      [place]; [facility]     Medications . azithromycin  500 mg Oral Daily  . bisacodyl  10 mg Oral Once  . cefTRIAXone (ROCEPHIN)  IV  1 g Intravenous Q24H  . docusate sodium  100 mg Oral BID  . enoxaparin (LOVENOX) injection  40 mg Subcutaneous Q24H  . fluticasone  2 spray Each Nare Daily  . furosemide  20 mg Oral Daily  . insulin aspart  0-15 Units Subcutaneous TID WC  . insulin aspart  0-5 Units Subcutaneous QHS  . [START ON 06/30/2015] insulin aspart protamine- aspart  44 Units Subcutaneous Q breakfast  . insulin aspart protamine- aspart  60 Units Subcutaneous Q supper  . isosorbide mononitrate  30 mg Oral Daily  . losartan  100 mg Oral Daily  . metoprolol tartrate  12.5 mg Oral BID  . PARoxetine  10 mg Oral Daily  . pregabalin  100 mg Oral TID  . rosuvastatin  20 mg Oral QHS  . ticagrelor  90 mg Oral BID     Social history Social History  Substance Use Topics  . Smoking status: Never Smoker   . Smokeless tobacco:  Never Used  . Alcohol Use: No    Family history Family History  Problem Relation Age of Onset  . Heart disease Father   . Heart attack Father   . Heart disease Sister   . Heart attack Sister   . Heart disease Brother   . Heart attack Brother   . Asthma Grandchild   . Breast cancer Maternal Aunt     Review of systems CV: no chest pain or palpitations PULM: + cough. Mild shortness of breath ABD: no abdominal pain or nausea or vomiting   Physical Exam BP 140/68 mmHg  Pulse 63  Temp(Src) 97.7 F (36.5 C) (Oral)  Resp 17  Ht 5\' 6"  (1.676 m)  Wt 92.987 kg (205 lb)  BMI 33.10 kg/m2  SpO2 96%  GEN: well developed, well nourished female, in NAD. HEENT: No proptosis, EOMI, lid lag or stare. Oropharynx is clear.  NECK: supple, trachea midline.   RESPIRATORY: clear bilaterally, + wheeze, good inspiratory effort. CV: No carotid bruits, RRR. EXT: no peripheral edema SKIN: no dermatopathy or rash LYMPH: no submandibular or supraclavicular LAD NEURO: PERR PSYC: alert and oriented, good insight  Labs Results for orders placed or performed during the hospital encounter of 06/26/15 (from the past 24 hour(s))  Troponin I     Status: Abnormal   Collection Time: 06/28/15  3:50 PM  Result Value Ref Range   Troponin I 0.04 (H) <0.031 ng/mL  Glucose, capillary     Status: Abnormal   Collection Time: 06/28/15  4:26 PM  Result Value Ref Range   Glucose-Capillary 147 (H) 65 - 99 mg/dL  Glucose, capillary     Status: None   Collection Time: 06/28/15  8:30 PM  Result Value Ref Range   Glucose-Capillary 76 65 - 99 mg/dL  Troponin I     Status: None   Collection Time: 06/29/15  8:02 AM  Result Value Ref Range   Troponin I <0.03 <0.031 ng/mL  Basic metabolic panel     Status: Abnormal   Collection Time: 06/29/15  8:02 AM  Result Value Ref Range   Sodium 140 135 - 145 mmol/L   Potassium 3.9 3.5 - 5.1 mmol/L   Chloride 107 101 - 111 mmol/L   CO2 26 22 - 32 mmol/L   Glucose, Bld 172  (H) 65 - 99 mg/dL   BUN 40 (H) 6 - 20 mg/dL   Creatinine, Ser 1.43 (H) 0.44 - 1.00 mg/dL   Calcium 9.0 8.9 - 10.3 mg/dL   GFR calc non Af Amer 35 (L) >60 mL/min   GFR calc Af Amer 40 (L) >60 mL/min   Anion gap 7 5 - 15  Glucose, capillary     Status: Abnormal  Collection Time: 06/29/15  8:15 AM  Result Value Ref Range   Glucose-Capillary 157 (H) 65 - 99 mg/dL   Comment 1 Notify RN   Glucose, capillary     Status: Abnormal   Collection Time: 06/29/15 11:19 AM  Result Value Ref Range   Glucose-Capillary 240 (H) 65 - 99 mg/dL   Comment 1 Notify RN     Lab Results  Component Value Date   HGBA1C 7.8* 06/08/2015    Assessment Type 2 diabetes Long-term use of insulin  Plan 1. Stop Lantus and scheduled mealtime NovoLog. 2. Resume NovoLog 70/30 Mix, 44 units in AM and 60 units in PM before meals. 3. Patient does not need insulin or testing supplies on discharge. 4. She will follow up with PCP after discharge.  Will sign off. Call if concerns.

## 2015-06-29 NOTE — Discharge Instructions (Signed)
Heart healthy diet. Activity as tolerated. [place]

## 2015-06-29 NOTE — Consult Note (Signed)
Cardiology Consult    Patient ID: Pamela Collier MRN: [id number], DOB/AGE: 01-31-1941   Admit date: 06/26/2015 Date of Consult: 06/29/2015  Primary Physician: [name], MD Reason for Consult: Chest Pain Primary Cardiologist: Dr. Rockey Situ Requesting Provider: Dr. Bridgett Larsson   History of Present Illness    Pamela Collier is a 75 y.o. female with past medical history of CAD (PCI to LAD, Cx, and RCA throughout 2010, 2011, 2013, and 2016- performed in [place]), Type 2 DM, HTN, HLD, chronic diastolic CHF, hx of ischemic cardiomyopathy (EF 35-40% in 2013, improved to 50-55% by echo in 2016) COPD, pulmonary sarcoidosis, and Stage 3 CKD who presented to [facility] on 06/26/2015 for respiratory distress.  She reported having notable wheezing for the past 2-3 days and was using her nebulizer treatment at home without any improvement in her symptoms. She initially reported having some left-sided chest pain as well which was exacerbated with coughing and taking deep breaths. A CXR was obtained in the ER and showed a new opacity in the right infrahilar region and she was started on Azithromycin and Rocephin. She traveled to [place] the previous week and a D-dimer was obtained to rule out PE/DVT. It was elevated to 1465 so a CTA was obtained which showed no evidence of large central pulmonary embolus. However, a moderate right peural effusion was noted along with a stable 5.5 mm nodule seen in lingular segment of left lower lobe. A follow-up CT scan in 12 months was recommended. Lower extremity dopplers showed no evidence of a DVT.  In regards to her right-sided pleural effusion, Pulmonology was consulted and recommended a thoracentesis. This was performed on 06/28/2015 and 285mL of exudative appearing fluid was removed. Fluid analysis is pending.  Yesterday afternoon, at the time she was scheduled to get discharged, she developed a sharp, stabbing pain along her left chest. She reports the pain lasted 3-4  minutes and resolved completely with administration of SL NTG. An EKG was obtained which showed NSR with 1st degree AV block and RBBB with no acute ischemic changes noted.   Troponin value on admission was 0.05. Additional troponin values were obtained following her episode of pain last night and have been 0.04 and < 0.03.  She denies any repeat symptoms overnight or this morning. No episodes of chest pain over the past several weeks. Reports chronic dyspnea. Reports this pain does not feel similar to her previous episodes of MI's when she required stent placement.  Past Medical History   Past Medical History  Diagnosis Date  . 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 Cardiomyopathy, ischemic Jan 2013    a. EF 35 to 40% ([place]) b. EF improved to 50-55% by echo in 04/2015   . Hepatic steatosis     by CT abd pelvis  . Osteoporosis, post-menopausal   . 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 Atherosclerotic peripheral vascular disease with gangrene [facility]) august 2012  . Hypertension   . Sarcoidosis ([place])   . CHF (congestive heart failure) ([place])   . Renal insufficiency   . 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 CCF (congestive cardiac failure) ([place]) 08/17/2013  . Pleural effusion 10/25/2012    10/2012 CT chest >> small to moderate R lung effusion>> chylothorax, 100% lymphs 10/2013 thoracentesis> cytology negative, WBC 1471, >  90% "small lymphs" 01/2014 CT chest> near complete resolution of pleural effusion, stable lymphadenopathy 08/2014 CT chest [place] ([facility]): small right sided effusion decreased in size, stable mediastinal lymphadenopathy 1.0cm largest, 18m  . Pulmonary sarcoidosis ([place]) 12/07/2012    Diagnosed over 20 years ago in [place] with a mediastinal biopsy 03/2013 Full PFT [facility] > UNACCEPTABLE AND NOT REPRODUCIBLE  DATA> Ratio 71% FEV 1 1.02 L (55% pred), FVC 1.31 L (49% pred) could not do lung volumes or DLCO   . Acute respiratory failure ([place]) 11/21/2014  . Diabetic diarrhea ([place]) 10/03/2014  . DM type 2, uncontrolled, with renal complications ([place]) [id number]  . Cerebral infarct ([place]) 08/17/2013  . Arthritis 08/05/2013  . 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 Hyperlipidemia LDL goal <100 02/23/2014  . COPD (chronic obstructive pulmonary disease) [facility])     Past Surgical History  Procedure Laterality Date  . Abdominal hysterectomy      at ge 39. secondary to bleeding  . Toe amputation  Sept 2012    Right 2nd toe, [place]  . Ptca  August 2012    Right Posterior tibial artery , [place]  . Colonoscopy with propofol N/A 01/09/2015    Procedure: COLONOSCOPY WITH PROPOFOL;  Surgeon: Lucilla Lame, MD;  Location: [facility] ENDOSCOPY;  Service: Endoscopy;  Laterality: N/A;  . Cholecystectomy    . Carpal tunnel release Bilateral   . Breast cyst aspiration Right   . Coronary angioplasty with stent placement      [place]; [facility]     Allergies  Allergies  Allergen Reactions  . Contrast Media [Iodinated Diagnostic Agents] Itching  . Gabapentin Itching  . Sulfa Antibiotics Itching    Inpatient Medications    . azithromycin  500 mg Oral Daily  . bisacodyl  10 mg Oral Once  . cefTRIAXone (ROCEPHIN)  IV  1 g Intravenous Q24H  . docusate sodium  100 mg Oral BID  . enoxaparin (LOVENOX) injection  40 mg Subcutaneous Q24H  . fluticasone  2 spray Each Nare Daily  . furosemide  20 mg Oral Daily  . insulin aspart  0-15 Units Subcutaneous TID WC  . insulin aspart  0-5 Units Subcutaneous QHS  . insulin aspart  5 Units Subcutaneous TID WC  . insulin glargine  70 Units Subcutaneous QHS  . isosorbide mononitrate  30 mg Oral Daily  . losartan  100 mg Oral Daily  . metoprolol tartrate  12.5 mg Oral BID  . PARoxetine  10 mg Oral Daily  . pregabalin  100 mg Oral TID  . rosuvastatin  20 mg Oral  QHS  . ticagrelor  90 mg Oral BID    Family History    Family History  Problem Relation Age of Onset  . Heart disease Father   . Heart attack Father   . Heart disease Sister   . Heart attack Sister   . Heart disease Brother   . Heart attack Brother   . Asthma Grandchild   . Breast cancer Maternal Aunt     Social History    Social History   Social History  . Marital Status: Married    Spouse Name: N/A  . Number of Children: N/A  . Years of Education: N/A   Occupational History  . Not on file.   Social History Main Topics  . Smoking status: Never Smoker   . Smokeless tobacco: Never Used  . Alcohol Use: No  . Drug Use: No  .  Sexual Activity: Yes   Other Topics Concern  . Not on file   Social History Narrative     Review of Systems    General:  No chills, fever, night sweats or weight changes.  Cardiovascular:  No dyspnea on exertion, edema, orthopnea, palpitations, paroxysmal nocturnal dyspnea. Positive for chest pain and left shoulder pain. Dermatological: No rash, lesions/masses Respiratory: Positive for productive cough and dyspnea. Urologic: No hematuria, dysuria Abdominal:   No nausea, vomiting, diarrhea, bright red blood per rectum, melena, or hematemesis Neurologic:  No visual changes, wkns, changes in mental status. All other systems reviewed and are otherwise negative except as noted above.  Physical Exam    Blood pressure 140/68, pulse 63, temperature 97.7 F (36.5 C), temperature source Oral, resp. rate 17, height 5\' 6"  (1.676 m), weight 205 lb (92.987 kg), SpO2 96 %.  General: Pleasant, African American female appearing in NAD. Psych: Normal affect. Neuro: Alert and oriented X 3. Moves all extremities spontaneously. HEENT: Normal  Neck: Supple without bruits or JVD. Lungs:  Resp regular and unlabored, decreased breath sounds at the right base. Heart: RRR no s3, s4, or murmurs. Abdomen: Soft, non-tender, non-distended, BS + x 4.  Extremities:  No clubbing, cyanosis or edema. DP/PT/Radials 2+ and equal bilaterally.  Labs    Troponin (Point of Care Test) No results for input(s): TROPIPOC in the last 72 hours.  Recent Labs  06/27/15 1412 06/28/15 1550 06/29/15 0802  TROPONINI <0.03 0.04* <0.03   Lab Results  Component Value Date   WBC 9.8 06/26/2015   HGB 11.3* 06/26/2015   HCT 34.2* 06/26/2015   MCV 90.0 06/26/2015   PLT 272 06/26/2015     Recent Labs Lab 06/29/15 0802  NA 140  K 3.9  CL 107  CO2 26  BUN 40*  CREATININE 1.43*  CALCIUM 9.0  GLUCOSE 172*   Lab Results  Component Value Date   CHOL 151 06/08/2015   HDL 31.80* 06/08/2015   LDLCALC 89 06/08/2015   TRIG 148.0 06/08/2015   No results found for: [facility]   Radiology Studies    Dg Chest 1 View: 06/28/2015  CLINICAL DATA:  Status post right thoracentesis. EXAM: CHEST 1 VIEW COMPARISON:  Film earlier today at 0625 hours FINDINGS: There is decrease in right pleural fluid and improved aeration at the right lung base. No pneumothorax. Bibasilar atelectasis remains. No pulmonary edema. IMPRESSION: Decrease in right pleural effusion with improved aeration at the right lung base. No pneumothorax following thoracentesis. Electronically Signed   By: Aletta Edouard M.D.   On: 06/28/2015 17:14   Ct Angio Chest Pe W/cm &/or Wo Cm: 06/26/2015  CLINICAL DATA:  Chest pain and shortness of breath. EXAM: CT ANGIOGRAPHY CHEST WITH CONTRAST TECHNIQUE: Multidetector CT imaging of the chest was performed using the standard protocol during bolus administration of intravenous contrast. Multiplanar CT image reconstructions and MIPs were obtained to evaluate the vascular anatomy. CONTRAST:  12mL OMNIPAQUE IOHEXOL 350 MG/ML SOLN COMPARISON:  CT scan of January 11, 2015. FINDINGS: No pneumothorax is noted. Moderate right pleural effusion is noted with adjacent subsegmental atelectasis in the right lower lobe. Stable 5.5 mm nodule seen in inferior portion of the lingula best seen on  image number 77 of series 7. There is no evidence of large central pulmonary embolus. However, due to respiratory motion artifact, evaluation of the more peripheral branches in the lower lobes is limited, and smaller emboli cannot be excluded. Atherosclerosis of thoracic aorta is noted without aneurysm or  dissection. Stable mediastinal adenopathy is noted, most likely reactive or inflammatory in etiology. Adenopathy is also seen in the visualized portion of the upper abdomen which was present on prior exam is well. Review of the MIP images confirms the above findings. IMPRESSION: No evidence of large central pulmonary embolus. However, due to respiratory motion artifact, evaluation of the lower lobe branches bilaterally is limited, and therefore smaller peripheral pulmonary emboli cannot be excluded on the basis of this exam. Moderate right pleural effusion is noted with adjacent subsegmental atelectasis seen in the right lung base. Stable adenopathy is noted in mediastinum and upper abdomen which may be related to sarcoidosis, or other inflammation. Stable 5.5 mm nodule seen in lingular segment of left lower lobe compared to prior exam. Followup unenhanced chest CT scan in 12 months is recommended. Electronically Signed   By: Marijo Conception, M.D.   On: 06/26/2015 16:34   US Venous Img Lower Bilateral: 06/26/2015  CLINICAL DATA:  Calf pain. Shortness of breath. Elevated D-dimer. History diabetes. Post fall onto the right leg during December of 2016. Evaluate for DVT. EXAM: BILATERAL LOWER EXTREMITY VENOUS DOPPLER ULTRASOUND TECHNIQUE: Gray-scale sonography with graded compression, as well as color Doppler and duplex ultrasound were performed to evaluate the lower extremity deep venous systems from the level of the common femoral vein and including the common femoral, femoral, profunda femoral, popliteal and calf veins including the posterior tibial, peroneal and gastrocnemius veins when visible. The superficial  great saphenous vein was also interrogated. Spectral Doppler was utilized to evaluate flow at rest and with distal augmentation maneuvers in the common femoral, femoral and popliteal veins. COMPARISON:  Bilateral lower extremity venous Doppler ultrasound - 11/14/2013; left lower extremity venous Doppler ultrasound - 05/06/2012; 05/21/2007 FINDINGS: RIGHT LOWER EXTREMITY Common Femoral Vein: No evidence of thrombus. Normal compressibility, respiratory phasicity and response to augmentation. Saphenofemoral Junction: No evidence of thrombus. Normal compressibility and flow on color Doppler imaging. Profunda Femoral Vein: No evidence of thrombus. Normal compressibility and flow on color Doppler imaging. Femoral Vein: No evidence of thrombus. Normal compressibility, respiratory phasicity and response to augmentation. Popliteal Vein: No evidence of thrombus. Normal compressibility, respiratory phasicity and response to augmentation. Calf Veins: No evidence of thrombus. Normal compressibility and flow on color Doppler imaging. Superficial Great Saphenous Vein: No evidence of thrombus. Normal compressibility and flow on color Doppler imaging. Venous Reflux:  None. Other Findings:  None. LEFT LOWER EXTREMITY Common Femoral Vein: No evidence of thrombus. Normal compressibility, respiratory phasicity and response to augmentation. Saphenofemoral Junction: No evidence of thrombus. Normal compressibility and flow on color Doppler imaging. Profunda Femoral Vein: No evidence of thrombus. Normal compressibility and flow on color Doppler imaging. Femoral Vein: No evidence of thrombus. Normal compressibility, respiratory phasicity and response to augmentation. Popliteal Vein: No evidence of thrombus. Normal compressibility, respiratory phasicity and response to augmentation. Calf Veins: No evidence of thrombus. Normal compressibility and flow on color Doppler imaging. Superficial Great Saphenous Vein: No evidence of thrombus. Normal  compressibility and flow on color Doppler imaging. Venous Reflux:  None. Other Findings:  None. IMPRESSION: No evidence of DVT within either lower extremity. Electronically Signed   By: Sandi Mariscal M.D.   On: 06/26/2015 11:11   Dg Chest Port 1 View: 06/28/2015  CLINICAL DATA:  Pneumonia, pleural effusion, status post thoracentesis, CHF and cardiomyopathy. EXAM: PORTABLE CHEST 1 VIEW COMPARISON:  Portable chest x-ray and CT scan of the chest of 26 June 2015 FINDINGS: The patient is positioned in a somewhat  lordotic matter today. The hemidiaphragms are visible bilaterally. A small amount of pleural fluid on the right likely persists. The cardiac silhouette is enlarged. The pulmonary vascularity is prominent centrally. The pulmonary interstitial markings are only minimally prominent. There is no pneumothorax. The observed bony thorax is unremarkable. IMPRESSION: Somewhat limited study due to mild hypo inflation. There is right basilar subsegmental atelectasis with small pleural effusion. There is no post thoracentesis pneumothorax. Cardiomegaly with mild central pulmonary vascular congestion. Electronically Signed   By: David  Martinique M.D.   On: 06/28/2015 07:55   Dg Chest Portable 1 View: 06/26/2015  CLINICAL DATA:  Patient woke this morning with wheezing and difficulty breathing. Left-sided chest pain since yesterday. EXAM: PORTABLE CHEST 1 VIEW COMPARISON:  04/18/2015 FINDINGS: Shallow inspiration. Heart size and pulmonary vascularity appear normal. Since the previous study, there is increased density inferior to the right hilum. This could represent vascular crowding due to shallow inspiration, developing mass or infiltration. Given the history, this could indicate pneumonia. Followup PA and lateral chest X-ray is recommended in 3-4 weeks following trial of antibiotic therapy to ensure resolution and exclude underlying malignancy. Old left rib fractures. No pneumothorax. No blunting of costophrenic angles  there IMPRESSION: New opacity in the right infrahilar region could represent shallow inspiration common developing mass, or pneumonia. Electronically Signed   By: Lucienne Capers M.D.   On: 06/26/2015 06:16   US Thoracentesis Asp Pleural Space W/img Guide: 06/28/2015  CLINICAL DATA:  Pneumonia and right pleural effusion. EXAM: ULTRASOUND GUIDED RIGHT THORACENTESIS COMPARISON:  Chest x-ray earlier today. PROCEDURE: An ultrasound guided thoracentesis was thoroughly discussed with the patient and questions answered. The benefits, risks, alternatives and complications were also discussed. The patient understands and wishes to proceed with the procedure. Written consent was obtained. A time-out was performed. Ultrasound was performed to localize and mark an adequate pocket of fluid in the right chest. The area was then prepped and draped in the normal sterile fashion. 1% Lidocaine was used for local anesthesia. Under ultrasound guidance a 6 French Safe-T-Centesis catheter was introduced. Thoracentesis was performed. The catheter was removed and a dressing applied. COMPLICATIONS: None FINDINGS: A total of approximately 215 mL of turbid, yellow colored fluid was removed. A fluid sample was sent for laboratory analysis. IMPRESSION: Successful ultrasound guided right thoracentesis yielding 215 mL of turbid pleural fluid. Electronically Signed   By: Aletta Edouard M.D.   On: 06/28/2015 17:16    EKG & Cardiac Imaging    EKG: 1st degree AV block, HR 59, RBBB. No significant changes when compared to previous tracings.  Echocardiogram: 04/18/2015 Study Conclusions - Left ventricle: The cavity size was normal. There was moderate concentric hypertrophy. Systolic function was normal. The estimated ejection fraction was in the range of 50% to 55%. Regional wall motion abnormalities cannot be excluded. Select images concerning for basal inferior wall hypokinesis. Apical images not well visualized. Doppler  parameters are consistent with abnormal left ventricular relaxation (grade 1 diastolic dysfunction). - Mitral valve: Calcified annulus. - Left atrium: The atrium was normal in size. - Right ventricle: Systolic function was normal. - Pulmonary arteries: Systolic pressure was within the normal range.  Impressions: - Challenging image quality.  Assessment & Plan    1. Chest Pain with Low Risk of Acute Coronary Syndrome - developed 3-4 minute episode of left-sided shooting pain in the setting of PNA and following a thoracentesis earlier that day. Had reported pleuritic pain earlier this admission which was reproducible with palpation. - denies any  other anginal symptoms at that time. No repeat episodes of pain since. This pain did not resemble her previous MI's. - EKG shows no acute ischemic changes. Troponin values have been 0.04 and < 0.03 following her episode of chest pain. - would recommended an outpatient Lexiscan Myoview as the patient was already consuming breakfast this morning and has taken several of her medications. Would also want to wait until she has improved from her acute respiratory illness to minimize the side-effects of the Lexiscan.  2. History of CAD - s/p PCI to LAD, Cx, and RCA throughout 2010, 2011, 2013, and 2016, all performed in [place]. - continue ASA, Brilinta, statin, BB, Imdur, and ACE-I.  3. Chronic Diastolic CHF/ History of Ischemic Cardiomyopathy - EF 35-40% in 2013, improved to 50-55% by echo in 2016 - does not appear volume overloaded on physical exam.  - continue BB, ACE-I, and PTA Lasix at the time of discharge.  4. HTN - BP has been 108/40 - 142/68 in the past 24 hours - continue Losartan 100mg  daily and Lopressor 12.5mg  BID.  5. HLD - continue statin therapy  6. Acute PNA/ COPD/Pulmonary Sarcoidosis - CXR on admission showed right infrahilar PNA. CTA showed moderate right pleural effusion. - s/p thoracentesis on 06/28/2015. Fluid  analysis pending - per Pulmonology and admitting team  7. Stage 3 CKD - creatinine stable at 1.43 on 06/29/2015. - close to baseline.  8. Lung Nodule -  5.5 mm nodule seen in lingular segment of left lower lobe on CT this admission. - follow-up CT scan in 12 months recommended.  Signed, Erma Heritage, PA-C 06/29/2015, 12:22 PM Pager: [phone]

## 2015-06-30 DIAGNOSIS — Z89421 Acquired absence of other right toe(s): Secondary | ICD-10-CM | POA: Diagnosis not present

## 2015-06-30 DIAGNOSIS — Z955 Presence of coronary angioplasty implant and graft: Secondary | ICD-10-CM | POA: Diagnosis not present

## 2015-06-30 DIAGNOSIS — Z8673 Personal history of transient ischemic attack (TIA), and cerebral infarction without residual deficits: Secondary | ICD-10-CM | POA: Diagnosis not present

## 2015-06-30 DIAGNOSIS — J440 Chronic obstructive pulmonary disease with acute lower respiratory infection: Secondary | ICD-10-CM | POA: Diagnosis not present

## 2015-06-30 DIAGNOSIS — Z794 Long term (current) use of insulin: Secondary | ICD-10-CM | POA: Diagnosis not present

## 2015-06-30 DIAGNOSIS — Z7982 Long term (current) use of aspirin: Secondary | ICD-10-CM | POA: Diagnosis not present

## 2015-06-30 DIAGNOSIS — I130 Hypertensive heart and chronic kidney disease with heart failure and stage 1 through stage 4 chronic kidney disease, or unspecified chronic kidney disease: Secondary | ICD-10-CM | POA: Diagnosis not present

## 2015-06-30 DIAGNOSIS — J45909 Unspecified asthma, uncomplicated: Secondary | ICD-10-CM | POA: Diagnosis not present

## 2015-06-30 DIAGNOSIS — E1122 Type 2 diabetes mellitus with diabetic chronic kidney disease: Secondary | ICD-10-CM | POA: Diagnosis not present

## 2015-06-30 DIAGNOSIS — E785 Hyperlipidemia, unspecified: Secondary | ICD-10-CM | POA: Diagnosis not present

## 2015-06-30 DIAGNOSIS — N183 Chronic kidney disease, stage 3 (moderate): Secondary | ICD-10-CM | POA: Diagnosis not present

## 2015-06-30 DIAGNOSIS — J209 Acute bronchitis, unspecified: Secondary | ICD-10-CM | POA: Diagnosis not present

## 2015-06-30 DIAGNOSIS — D860 Sarcoidosis of lung: Secondary | ICD-10-CM | POA: Diagnosis not present

## 2015-06-30 DIAGNOSIS — M810 Age-related osteoporosis without current pathological fracture: Secondary | ICD-10-CM | POA: Diagnosis not present

## 2015-06-30 DIAGNOSIS — E1159 Type 2 diabetes mellitus with other circulatory complications: Secondary | ICD-10-CM | POA: Diagnosis not present

## 2015-06-30 DIAGNOSIS — I255 Ischemic cardiomyopathy: Secondary | ICD-10-CM | POA: Diagnosis not present

## 2015-06-30 DIAGNOSIS — I509 Heart failure, unspecified: Secondary | ICD-10-CM | POA: Diagnosis not present

## 2015-06-30 DIAGNOSIS — F418 Other specified anxiety disorders: Secondary | ICD-10-CM | POA: Diagnosis not present

## 2015-06-30 DIAGNOSIS — J189 Pneumonia, unspecified organism: Secondary | ICD-10-CM | POA: Diagnosis not present

## 2015-06-30 DIAGNOSIS — I272 Other secondary pulmonary hypertension: Secondary | ICD-10-CM | POA: Diagnosis not present

## 2015-06-30 DIAGNOSIS — I2510 Atherosclerotic heart disease of native coronary artery without angina pectoris: Secondary | ICD-10-CM | POA: Diagnosis not present

## 2015-07-01 LAB — CULTURE, BLOOD (ROUTINE X 2)
Culture: NO GROWTH
Culture: NO GROWTH

## 2015-07-02 ENCOUNTER — Telehealth: Payer: Self-pay

## 2015-07-02 ENCOUNTER — Encounter: Payer: Self-pay | Admitting: *Deleted

## 2015-07-02 ENCOUNTER — Other Ambulatory Visit: Payer: Self-pay | Admitting: *Deleted

## 2015-07-02 DIAGNOSIS — E1122 Type 2 diabetes mellitus with diabetic chronic kidney disease: Secondary | ICD-10-CM | POA: Diagnosis not present

## 2015-07-02 DIAGNOSIS — J189 Pneumonia, unspecified organism: Secondary | ICD-10-CM | POA: Diagnosis not present

## 2015-07-02 DIAGNOSIS — J440 Chronic obstructive pulmonary disease with acute lower respiratory infection: Secondary | ICD-10-CM | POA: Diagnosis not present

## 2015-07-02 DIAGNOSIS — J209 Acute bronchitis, unspecified: Secondary | ICD-10-CM | POA: Diagnosis not present

## 2015-07-02 DIAGNOSIS — I130 Hypertensive heart and chronic kidney disease with heart failure and stage 1 through stage 4 chronic kidney disease, or unspecified chronic kidney disease: Secondary | ICD-10-CM | POA: Diagnosis not present

## 2015-07-02 DIAGNOSIS — I509 Heart failure, unspecified: Secondary | ICD-10-CM | POA: Diagnosis not present

## 2015-07-02 LAB — BODY FLUID CULTURE: CULTURE: NO GROWTH

## 2015-07-02 LAB — CYTOLOGY - NON PAP

## 2015-07-02 NOTE — Telephone Encounter (Signed)
i need a reason for any referral

## 2015-07-02 NOTE — Patient Outreach (Signed)
Transition of care call (week 1,discharged 2/17).  Spoke with pt, HIPPA verified. Pt reports has diarrhea, started today, little lightheaded.  Pt reports [facility] RN just left,nurse  informed her sometimes s/e of antibiotic is diarrhea.  Pt reports staying hydrated.  Pt reports HH RN went over her discharge medications, taking as directed, not able to review again with RN CM as  lying down.  RN CM discussed with pt use of BRAT diet, call MD if diarrhea does not improve to which pt said she would.   Discussed with pt plan to follow for transition of care, weekly phone calls/home visit to which pt agreed.    As  discussed, plan to f/u again with pt  telephonically on  2/28, discuss scheduling home visit then.   Plan to inform Dr[facility] Derrel Nip [facility].     Pamela Collier.   [facility]  [phone]

## 2015-07-02 NOTE — Telephone Encounter (Signed)
[name] from [place] would like a referral for social work put in for Ms. Wendi Snipes. Please advise thanks

## 2015-07-02 NOTE — Telephone Encounter (Signed)
Unable to reach patient.  No answer.  Will continue to follow up with transitional care management.  Hospital follow up appointment scheduled with PCP.

## 2015-07-02 NOTE — Telephone Encounter (Signed)
Pamela Collier states she needs community resources, Pt needs financial help staying out of the hospital. Please advise, thanks

## 2015-07-03 ENCOUNTER — Other Ambulatory Visit: Payer: Self-pay | Admitting: Internal Medicine

## 2015-07-03 ENCOUNTER — Encounter: Payer: Self-pay | Admitting: Internal Medicine

## 2015-07-03 ENCOUNTER — Telehealth: Payer: Self-pay

## 2015-07-03 DIAGNOSIS — Z599 Problem related to housing and economic circumstances, unspecified: Secondary | ICD-10-CM

## 2015-07-03 DIAGNOSIS — J440 Chronic obstructive pulmonary disease with acute lower respiratory infection: Secondary | ICD-10-CM | POA: Diagnosis not present

## 2015-07-03 DIAGNOSIS — J189 Pneumonia, unspecified organism: Secondary | ICD-10-CM | POA: Diagnosis not present

## 2015-07-03 DIAGNOSIS — J209 Acute bronchitis, unspecified: Secondary | ICD-10-CM | POA: Diagnosis not present

## 2015-07-03 DIAGNOSIS — I509 Heart failure, unspecified: Secondary | ICD-10-CM | POA: Diagnosis not present

## 2015-07-03 DIAGNOSIS — E1122 Type 2 diabetes mellitus with diabetic chronic kidney disease: Secondary | ICD-10-CM | POA: Diagnosis not present

## 2015-07-03 DIAGNOSIS — I130 Hypertensive heart and chronic kidney disease with heart failure and stage 1 through stage 4 chronic kidney disease, or unspecified chronic kidney disease: Secondary | ICD-10-CM | POA: Diagnosis not present

## 2015-07-03 DIAGNOSIS — Z598 Other problems related to housing and economic circumstances: Secondary | ICD-10-CM

## 2015-07-03 LAB — CHOLESTEROL, BODY FLUID: CHOL FL: 35 mg/dL

## 2015-07-03 LAB — TRIGLYCERIDES, BODY FLUIDS: Triglycerides, Fluid: 227 mg/dL

## 2015-07-03 NOTE — Telephone Encounter (Signed)
Transition Care Management Follow-up Telephone Call   Date discharged? 06/29/15   How have you been since you were released from the hospital? Still dizzy, SOB and fatigued.  Headaches about once a day.  No chest pain. No wheezing.     Do you understand why you were in the hospital? Yes, pneumonia.   Do you understand the discharge instructions? Yes, using the Duoneb PRN and spirometer 4 times daily.   Where were you discharged to? Home   Items Reviewed:  Medications reviewed: Yes, taking all scheduled medications without issues.  Allergies reviewed: Yes, no changes.  Dietary changes reviewed: Yes, no issues.  Referrals reviewed: Yes, cardiology and pulmonary appointments.   Functional Questionnaire:   Activities of Daily Living (ADLs):   She states they are independent in the following: Toileting, grooming, self feeding, bathing, dressing. States they require assistance with the following: Ambulating (uses walker), meal prep, husband assists.  Home health PT in place.   Any transportation issues/concerns?: No   Any patient concerns? Dizziness when standing. Daily headaches.   Confirmed importance and date/time of follow-up visits scheduled Yes, appointment scheduled 07/05/15 at 2:00pm.  Provider Appointment booked with Dr. Derrel Nip (PCP).  Confirmed with patient if condition begins to worsen call PCP or go to the ER.  Patient was given the office number and encouraged to call back with question or concerns.  : Yes, patient verbalized understanding.

## 2015-07-04 LAB — BLOOD GAS, VENOUS
Acid-Base Excess: 1.7 mmol/L (ref 0.0–3.0)
Bicarbonate: 25.8 mEq/L (ref 21.0–28.0)
PCO2 VEN: 38 mmHg — AB (ref 44.0–60.0)
PH VEN: 7.44 — AB (ref 7.320–7.430)
Patient temperature: 37

## 2015-07-05 ENCOUNTER — Ambulatory Visit (INDEPENDENT_AMBULATORY_CARE_PROVIDER_SITE_OTHER): Payer: Medicare Other | Admitting: Internal Medicine

## 2015-07-05 ENCOUNTER — Encounter: Payer: Self-pay | Admitting: Internal Medicine

## 2015-07-05 VITALS — BP 102/54 | HR 63 | Temp 98.3°F | Resp 16 | Ht 66.0 in | Wt 195.6 lb

## 2015-07-05 DIAGNOSIS — IMO0002 Reserved for concepts with insufficient information to code with codable children: Secondary | ICD-10-CM

## 2015-07-05 DIAGNOSIS — G4733 Obstructive sleep apnea (adult) (pediatric): Secondary | ICD-10-CM

## 2015-07-05 DIAGNOSIS — J189 Pneumonia, unspecified organism: Secondary | ICD-10-CM

## 2015-07-05 DIAGNOSIS — Z794 Long term (current) use of insulin: Secondary | ICD-10-CM

## 2015-07-05 DIAGNOSIS — J948 Other specified pleural conditions: Secondary | ICD-10-CM | POA: Diagnosis not present

## 2015-07-05 DIAGNOSIS — I10 Essential (primary) hypertension: Secondary | ICD-10-CM

## 2015-07-05 DIAGNOSIS — E1165 Type 2 diabetes mellitus with hyperglycemia: Secondary | ICD-10-CM

## 2015-07-05 DIAGNOSIS — J181 Lobar pneumonia, unspecified organism: Principal | ICD-10-CM

## 2015-07-05 DIAGNOSIS — J90 Pleural effusion, not elsewhere classified: Secondary | ICD-10-CM

## 2015-07-05 DIAGNOSIS — E1121 Type 2 diabetes mellitus with diabetic nephropathy: Secondary | ICD-10-CM | POA: Diagnosis not present

## 2015-07-05 DIAGNOSIS — Z09 Encounter for follow-up examination after completed treatment for conditions other than malignant neoplasm: Secondary | ICD-10-CM

## 2015-07-05 DIAGNOSIS — Z9989 Dependence on other enabling machines and devices: Secondary | ICD-10-CM

## 2015-07-05 NOTE — Patient Instructions (Addendum)
We will try to get you an earlier appointment with Dr Lake Bells so your CPA supplies can be ordered.  Your oxygen level is fine when you walk.  You are just weak.    You are not due for diabetes follow up again until May,  But i will adjust your insulin in between whenever you see your readings go up  You should be keeping your sugar < 130 fasting and < 160 after meals.   Please drop off a log of your most recent blood sugars at your convenience so I can adjust your insulin dose  Lab Results  Component Value Date   HGBA1C 7.8* 06/08/2015

## 2015-07-05 NOTE — Progress Notes (Signed)
A, Can you call to see how we can help with her oxygen supplies? Thanks B

## 2015-07-05 NOTE — Progress Notes (Signed)
Subjective:  Patient ID: Pamela Collier, female    DOB: [birth year]  Age: 75 y.o. MRN: [id number]  CC: The primary encounter diagnosis was Right upper lobe pneumonia. Diagnoses of Pleural effusion, right, Uncontrolled type 2 diabetes mellitus with diabetic nephropathy, with long-term current use of insulin ([place]), Essential hypertension, OSA on CPAP, and Hospital discharge follow-up were also pertinent to this visit.  HPI Pamela Collier presents for HOSPITAL FOLLOW UP  PATIENT was admitted to [facility] on Feb 14th     With right sided PNA.  Diagnosed with pna ,  pleural effusion,.   Diagnostic tap,  But left some behind    Results still pending.  Asa and brilinta were suspended and resumed at discharge on Feb 17 . Stay was prolonged by sudden onset of left sided chest pain which required workup for AMI. Negative troponin. .    Doing better, since discharge. using walker,  Still Short of breath with exertion .  Says she was not checked for desats with ambulation prior to discharge.    Had diarrhea on Feb 20 (Monday and again yesterday .  Had 2 loose but not watery. No stools today .  Has eaten today   Vision becoming blurry as the day progresses,  Has not seen eye doctor in over 6 months.    Sugars 120 to 199.  Mostly higher.  Were 450 in the hospital due to prednisone use    Taking 44 units in the  am and 60 unitsat night of 70/30   Having left sided headaches daily ,  Started before the hospitalization .  Occurring not daily ,  But severe when they occur. Occurred during the hospitalization .No workup done . Occurring 2-3 times per week.  Wakes up with it.  Had a sleep study last year. Wears CPAP at night but the company has not been sending her supplies for the past 3-4 months because she has to see Dr Lake Bells so she has stopped wearing it as of Monday .  Dr. Lake Bells has been handling the ordering of her supplies.  but her first appt with him is March 28th       Outpatient Prescriptions Prior  to Visit  Medication Sig Dispense Refill  . acetaminophen (TYLENOL) 325 MG tablet Take 2 tablets (650 mg total) by mouth every 6 (six) hours as needed for mild pain (headache). 20 tablet 0  . albuterol (PROVENTIL) (2.5 MG/3ML) 0.083% nebulizer solution Take 3 mLs (2.5 mg total) by nebulization every 6 (six) hours as needed for wheezing or shortness of breath. 150 mL 1  . alendronate (FOSAMAX) 70 MG tablet Take with a full glass of water on an empty stomach every Sunday  Take with a full glass of water and take no other meds or food for   30 minutes . (Patient taking differently: Take 70 mg by mouth once a week. Take with a full glass of water on an empty stomach every Sunday  Take with a full glass of water and take no other meds or food for   30 minutes .) 12 tablet 1  . ALPRAZolam (XANAX) 0.5 MG tablet Take 1 tablet (0.5 mg total) by mouth daily as needed for sleep or anxiety. 90 tablet 0  . aspirin 81 MG EC tablet Take 1 tablet (81 mg total) by mouth daily. Swallow whole. 30 tablet 12  . benzonatate (TESSALON) 100 MG capsule TAKE TWO CAPSULES BY MOUTH THREE TIMES DAILY AS NEEDED FOR COUGH 90  capsule 0  . BRILINTA 90 MG TABS tablet Take 1 tablet (90 mg total) by mouth 2 (two) times daily. 180 tablet 2  . budesonide-formoterol (SYMBICORT) 160-4.5 MCG/ACT inhaler Inhale 2 puffs into the lungs 2 (two) times daily. 1 Inhaler 0  . COMBIVENT RESPIMAT 20-100 MCG/ACT AERS respimat Inhale 1 puff into the lungs 3 (three) times daily as needed. (Patient taking differently: Inhale 1 puff into the lungs 3 (three) times daily as needed for wheezing or shortness of breath. ) 2 Inhaler 3  . docusate sodium (COLACE) 100 MG capsule Take 100 mg by mouth daily as needed for mild constipation. Reported on 05/25/2015    . ergocalciferol (DRISDOL) 50000 units capsule Take 1 capsule (50,000 Units total) by mouth once a week. With Sunday dinner 4 capsule 0  . fluticasone (FLONASE) 50 MCG/ACT nasal spray Place 2 sprays into  both nostrils daily. 16 g 2  . furosemide (LASIX) 20 MG tablet Take 1 tablet (20 mg total) by mouth daily. 90 tablet 1  . insulin aspart protamine - aspart (NOVOLOG 70/30 MIX) (70-30) 100 UNIT/ML FlexPen 60 units before breakfast,  60 units before dinner.  NEEDS 15 PENS PER MONTH 105 mL 2  . isosorbide mononitrate (IMDUR) 30 MG 24 hr tablet TAKE 1 TABLET DAILY 90 tablet 1  . losartan (COZAAR) 100 MG tablet Take 1 tablet (100 mg total) by mouth daily. 90 tablet 2  . LYRICA 100 MG capsule TAKE ONE CAPSULE BY MOUTH THREE TIMES DAILY 270 capsule 0  . metoprolol tartrate (LOPRESSOR) 12.5 mg TABS tablet Take 12.5 mg by mouth 2 (two) times daily. Hold for SBP <110 and HR <60/min    . nitroGLYCERIN (NITROSTAT) 0.4 MG SL tablet Place 1 tablet (0.4 mg total) under the tongue every 5 (five) minutes as needed for chest pain. 25 tablet 3  . PARoxetine (PAXIL) 10 MG tablet Take 1 tablet (10 mg total) by mouth daily. 90 tablet 1  . rosuvastatin (CRESTOR) 20 MG tablet Take 1 tablet (20 mg total) by mouth daily. (Patient taking differently: Take 20 mg by mouth at bedtime. ) 90 tablet 1  . Spacer/Aero-Holding Chambers (AEROCHAMBER MV) inhaler Use as instructed 1 each 0  . traMADol (ULTRAM) 50 MG tablet Take 1 tablet (50 mg total) by mouth every 6 (six) hours as needed for moderate pain. 16 tablet 0  . azithromycin (ZITHROMAX) 500 MG tablet Take 1 tablet (500 mg total) by mouth daily. (Patient not taking: Reported on 07/05/2015) 5 tablet 0   Facility-Administered Medications Prior to Visit  Medication Dose Route Frequency Provider Last Rate Last Dose  . albuterol (PROVENTIL) (5 MG/ML) 0.5% nebulizer solution 2.5 mg  2.5 mg Nebulization Once [name], MD        Review of Systems;  Patient denies headache, fevers, malaise, unintentional weight loss, skin rash, eye pain, sinus congestion and sinus pain, sore throat, dysphagia,  hemoptysis , cough, dyspnea, wheezing, chest pain, palpitations, orthopnea, edema,  abdominal pain, nausea, melena, diarrhea, constipation, flank pain, dysuria, hematuria, urinary  Frequency, nocturia, numbness, tingling, seizures,  Focal weakness, Loss of consciousness,  Tremor, insomnia, depression, anxiety, and suicidal ideation.      Objective:  BP 102/54 mmHg  Pulse 63  Temp(Src) 98.3 F (36.8 C) (Oral)  Resp 16  Ht 5\' 6"  (1.676 m)  Wt 195 lb 9.6 oz (88.724 kg)  BMI 31.59 kg/m2  SpO2 95%  BP Readings from Last 3 Encounters:  07/05/15 102/54  06/29/15 122/58  05/25/15  138/76    Wt Readings from Last 3 Encounters:  07/05/15 195 lb 9.6 oz (88.724 kg)  06/27/15 205 lb (92.987 kg)  05/25/15 190 lb (86.183 kg)    General appearance: alert, cooperative and appears stated age Ears: normal TM's and external ear canals both ears Throat: lips, mucosa, and tongue normal; teeth and gums normal Neck: no adenopathy, no carotid bruit, supple, symmetrical, trachea midline and thyroid not enlarged, symmetric, no tenderness/mass/nodules Back: symmetric, no curvature. ROM normal. No CVA tenderness. Lungs: clear to auscultation bilaterally Heart: regular rate and rhythm, S1, S2 normal, no murmur, click, rub or gallop Abdomen: soft, non-tender; bowel sounds normal; no masses,  no organomegaly Pulses: 2+ and symmetric Skin: Skin color, texture, turgor normal. No rashes or lesions Lymph nodes: Cervical, supraclavicular, and axillary nodes normal.  Lab Results  Component Value Date   HGBA1C 7.8* 06/08/2015   HGBA1C 7.5* 03/05/2015   HGBA1C 7.4* 02/09/2015    Lab Results  Component Value Date   CREATININE 1.43* 06/29/2015   CREATININE 1.32* 06/26/2015   CREATININE 1.34* 06/08/2015    Lab Results  Component Value Date   WBC 9.8 06/26/2015   HGB 11.3* 06/26/2015   HCT 34.2* 06/26/2015   PLT 272 06/26/2015   GLUCOSE 172* 06/29/2015   CHOL 151 06/08/2015   TRIG 148.0 06/08/2015   HDL 31.80* 06/08/2015   LDLDIRECT 92.0 06/08/2015   LDLCALC 89 06/08/2015   ALT  15 06/08/2015   AST 19 06/08/2015   NA 140 06/29/2015   K 3.9 06/29/2015   CL 107 06/29/2015   CREATININE 1.43* 06/29/2015   BUN 40* 06/29/2015   CO2 26 06/29/2015   TSH 1.16 07/01/2013   INR 1.2* 02/02/2015   HGBA1C 7.8* 06/08/2015   MICROALBUR 23.1* 06/08/2015    Ct Angio Chest Pe W/cm &/or Wo Cm  06/26/2015  CLINICAL DATA:  Chest pain and shortness of breath. EXAM: CT ANGIOGRAPHY CHEST WITH CONTRAST TECHNIQUE: Multidetector CT imaging of the chest was performed using the standard protocol during bolus administration of intravenous contrast. Multiplanar CT image reconstructions and MIPs were obtained to evaluate the vascular anatomy. CONTRAST:  93mL OMNIPAQUE IOHEXOL 350 MG/ML SOLN COMPARISON:  CT scan of January 11, 2015. FINDINGS: No pneumothorax is noted. Moderate right pleural effusion is noted with adjacent subsegmental atelectasis in the right lower lobe. Stable 5.5 mm nodule seen in inferior portion of the lingula best seen on image number 77 of series 7. There is no evidence of large central pulmonary embolus. However, due to respiratory motion artifact, evaluation of the more peripheral branches in the lower lobes is limited, and smaller emboli cannot be excluded. Atherosclerosis of thoracic aorta is noted without aneurysm or dissection. Stable mediastinal adenopathy is noted, most likely reactive or inflammatory in etiology. Adenopathy is also seen in the visualized portion of the upper abdomen which was present on prior exam is well. Review of the MIP images confirms the above findings. IMPRESSION: No evidence of large central pulmonary embolus. However, due to respiratory motion artifact, evaluation of the lower lobe branches bilaterally is limited, and therefore smaller peripheral pulmonary emboli cannot be excluded on the basis of this exam. Moderate right pleural effusion is noted with adjacent subsegmental atelectasis seen in the right lung base. Stable adenopathy is noted in  mediastinum and upper abdomen which may be related to sarcoidosis, or other inflammation. Stable 5.5 mm nodule seen in lingular segment of left lower lobe compared to prior exam. Followup unenhanced chest CT scan in  12 months is recommended. Electronically Signed   By: Marijo Conception, M.D.   On: 06/26/2015 16:34   US Venous Img Lower Bilateral  06/26/2015  CLINICAL DATA:  Calf pain. Shortness of breath. Elevated D-dimer. History diabetes. Post fall onto the right leg during December of 2016. Evaluate for DVT. EXAM: BILATERAL LOWER EXTREMITY VENOUS DOPPLER ULTRASOUND TECHNIQUE: Gray-scale sonography with graded compression, as well as color Doppler and duplex ultrasound were performed to evaluate the lower extremity deep venous systems from the level of the common femoral vein and including the common femoral, femoral, profunda femoral, popliteal and calf veins including the posterior tibial, peroneal and gastrocnemius veins when visible. The superficial great saphenous vein was also interrogated. Spectral Doppler was utilized to evaluate flow at rest and with distal augmentation maneuvers in the common femoral, femoral and popliteal veins. COMPARISON:  Bilateral lower extremity venous Doppler ultrasound - 11/14/2013; left lower extremity venous Doppler ultrasound - 05/06/2012; 05/21/2007 FINDINGS: RIGHT LOWER EXTREMITY Common Femoral Vein: No evidence of thrombus. Normal compressibility, respiratory phasicity and response to augmentation. Saphenofemoral Junction: No evidence of thrombus. Normal compressibility and flow on color Doppler imaging. Profunda Femoral Vein: No evidence of thrombus. Normal compressibility and flow on color Doppler imaging. Femoral Vein: No evidence of thrombus. Normal compressibility, respiratory phasicity and response to augmentation. Popliteal Vein: No evidence of thrombus. Normal compressibility, respiratory phasicity and response to augmentation. Calf Veins: No evidence of thrombus.  Normal compressibility and flow on color Doppler imaging. Superficial Great Saphenous Vein: No evidence of thrombus. Normal compressibility and flow on color Doppler imaging. Venous Reflux:  None. Other Findings:  None. LEFT LOWER EXTREMITY Common Femoral Vein: No evidence of thrombus. Normal compressibility, respiratory phasicity and response to augmentation. Saphenofemoral Junction: No evidence of thrombus. Normal compressibility and flow on color Doppler imaging. Profunda Femoral Vein: No evidence of thrombus. Normal compressibility and flow on color Doppler imaging. Femoral Vein: No evidence of thrombus. Normal compressibility, respiratory phasicity and response to augmentation. Popliteal Vein: No evidence of thrombus. Normal compressibility, respiratory phasicity and response to augmentation. Calf Veins: No evidence of thrombus. Normal compressibility and flow on color Doppler imaging. Superficial Great Saphenous Vein: No evidence of thrombus. Normal compressibility and flow on color Doppler imaging. Venous Reflux:  None. Other Findings:  None. IMPRESSION: No evidence of DVT within either lower extremity. Electronically Signed   By: Sandi Mariscal M.D.   On: 06/26/2015 11:11   Dg Chest Portable 1 View  06/26/2015  CLINICAL DATA:  Patient woke this morning with wheezing and difficulty breathing. Left-sided chest pain since yesterday. EXAM: PORTABLE CHEST 1 VIEW COMPARISON:  04/18/2015 FINDINGS: Shallow inspiration. Heart size and pulmonary vascularity appear normal. Since the previous study, there is increased density inferior to the right hilum. This could represent vascular crowding due to shallow inspiration, developing mass or infiltration. Given the history, this could indicate pneumonia. Followup PA and lateral chest X-ray is recommended in 3-4 weeks following trial of antibiotic therapy to ensure resolution and exclude underlying malignancy. Old left rib fractures. No pneumothorax. No blunting of  costophrenic angles there IMPRESSION: New opacity in the right infrahilar region could represent shallow inspiration common developing mass, or pneumonia. Electronically Signed   By: Lucienne Capers M.D.   On: 06/26/2015 06:16    Assessment & Plan:   Problem List Items Addressed This Visit    DM (diabetes mellitus), type 2, uncontrolled, with renal complications ([place])    Secondary to steroids used  during hospitalization.  Improved since  discharge. No changes today   Lab Results  Component Value Date   HGBA1C 7.8* 06/08/2015   Lab Results  Component Value Date   MICROALBUR 23.1* 06/08/2015         Essential hypertension    Well controlled on current regimen. Renal function stable, no changes today.  Lab Results  Component Value Date   CREATININE 1.43* 06/29/2015   Lab Results  Component Value Date   NA 140 06/29/2015   K 3.9 06/29/2015   CL 107 06/29/2015   CO2 26 06/29/2015         Pneumonia - Primary    Resolved clinically.  Still dyspneic with exertion but her lung exam is normal and her ambulatory O2 sta sare > 95%.       Pleural effusion, right    She underwent diagnostic thoracentesis, not therapeutic,  But has a clear lung exam today.  Follow u with pulmonology      OSA on CPAP    Untreated for the past week due to shortage of supplies because she has not had follow up with Dr Lake Bells per patient )  Will try to get her in earlier than id [facility] discharge follow-up    hospital follow up.  I have reviewed the records from the hospital admission in detail with patient today.           I am having Ms. Kellison maintain her aspirin, alendronate, ALPRAZolam, furosemide, losartan, rosuvastatin, insulin aspart protamine - aspart, BRILINTA, fluticasone, PARoxetine, COMBIVENT RESPIMAT, albuterol, AEROCHAMBER MV, budesonide-formoterol, nitroGLYCERIN, docusate sodium, metoprolol tartrate, ergocalciferol, LYRICA, benzonatate, isosorbide mononitrate,  azithromycin, traMADol, and acetaminophen.  No orders of the defined types were placed in this encounter.    There are no discontinued medications.  Follow-up: Return in about 3 months (around 10/02/2015), or early May, for follow up diabetes.   [name], MD

## 2015-07-06 ENCOUNTER — Telehealth: Payer: Self-pay | Admitting: *Deleted

## 2015-07-06 DIAGNOSIS — E1122 Type 2 diabetes mellitus with diabetic chronic kidney disease: Secondary | ICD-10-CM | POA: Diagnosis not present

## 2015-07-06 DIAGNOSIS — I130 Hypertensive heart and chronic kidney disease with heart failure and stage 1 through stage 4 chronic kidney disease, or unspecified chronic kidney disease: Secondary | ICD-10-CM | POA: Diagnosis not present

## 2015-07-06 DIAGNOSIS — J440 Chronic obstructive pulmonary disease with acute lower respiratory infection: Secondary | ICD-10-CM | POA: Diagnosis not present

## 2015-07-06 DIAGNOSIS — J189 Pneumonia, unspecified organism: Secondary | ICD-10-CM | POA: Diagnosis not present

## 2015-07-06 DIAGNOSIS — I509 Heart failure, unspecified: Secondary | ICD-10-CM | POA: Diagnosis not present

## 2015-07-06 DIAGNOSIS — J209 Acute bronchitis, unspecified: Secondary | ICD-10-CM | POA: Diagnosis not present

## 2015-07-06 NOTE — Telephone Encounter (Signed)
Called to schedule hosp f/u and pt states she sees BQ and will see him on 07/16/15. States may switch to [place] after that appt. Nothing further needed.

## 2015-07-06 NOTE — Telephone Encounter (Signed)
-----   Message from [name], MD sent at 06/27/2015  4:05 PM EST ----- Former mcQiad pt, needs hfu with me in 2-4 weeks.

## 2015-07-07 DIAGNOSIS — J440 Chronic obstructive pulmonary disease with acute lower respiratory infection: Secondary | ICD-10-CM | POA: Diagnosis not present

## 2015-07-07 DIAGNOSIS — E1122 Type 2 diabetes mellitus with diabetic chronic kidney disease: Secondary | ICD-10-CM | POA: Diagnosis not present

## 2015-07-07 DIAGNOSIS — I130 Hypertensive heart and chronic kidney disease with heart failure and stage 1 through stage 4 chronic kidney disease, or unspecified chronic kidney disease: Secondary | ICD-10-CM | POA: Diagnosis not present

## 2015-07-07 DIAGNOSIS — Z9989 Dependence on other enabling machines and devices: Secondary | ICD-10-CM

## 2015-07-07 DIAGNOSIS — J189 Pneumonia, unspecified organism: Secondary | ICD-10-CM | POA: Diagnosis not present

## 2015-07-07 DIAGNOSIS — G4733 Obstructive sleep apnea (adult) (pediatric): Secondary | ICD-10-CM | POA: Insufficient documentation

## 2015-07-07 DIAGNOSIS — Z09 Encounter for follow-up examination after completed treatment for conditions other than malignant neoplasm: Secondary | ICD-10-CM | POA: Insufficient documentation

## 2015-07-07 DIAGNOSIS — J209 Acute bronchitis, unspecified: Secondary | ICD-10-CM | POA: Diagnosis not present

## 2015-07-07 DIAGNOSIS — I509 Heart failure, unspecified: Secondary | ICD-10-CM | POA: Diagnosis not present

## 2015-07-07 NOTE — Assessment & Plan Note (Signed)
Resolved clinically.  Still dyspneic with exertion but her lung exam is normal and her ambulatory O2 sta sare > 95%.

## 2015-07-07 NOTE — Assessment & Plan Note (Addendum)
Secondary to steroids used  during hospitalization.  Improved since discharge. No changes today   Lab Results  Component Value Date   HGBA1C 7.8* 06/08/2015   Lab Results  Component Value Date   MICROALBUR 23.1* 06/08/2015

## 2015-07-07 NOTE — Assessment & Plan Note (Signed)
Untreated for the past week due to shortage of supplies because she has not had follow up with Dr Lake Bells per patient )  Will try to get her in earlier than id march

## 2015-07-07 NOTE — Assessment & Plan Note (Signed)
She underwent diagnostic thoracentesis, not therapeutic,  But has a clear lung exam today.  Follow u with pulmonology

## 2015-07-07 NOTE — Assessment & Plan Note (Signed)
Well controlled on current regimen. Renal function stable, no changes today.  Lab Results  Component Value Date   CREATININE 1.43* 06/29/2015   Lab Results  Component Value Date   NA 140 06/29/2015   K 3.9 06/29/2015   CL 107 06/29/2015   CO2 26 06/29/2015

## 2015-07-07 NOTE — Assessment & Plan Note (Signed)
hospital follow up.  I have reviewed the records from the hospital admission in detail with patient today.

## 2015-07-08 IMAGING — CT CT CHEST W/O CM
2 of 4 series · 15 of 36 positions shown, 18 images · non-contrast
Comparison: 10/22/2012 and 05/06/2012

CLINICAL DATA: Follow-up pulmonary nodule

EXAM:
CT CHEST WITHOUT CONTRAST
TECHNIQUE: Multidetector CT imaging of the chest was performed following the
standard protocol without IV contrast.

[Series 2: routine chest wo · axial · 0.67mm/px · z∈[-652,-382]mm · 12 of 64 slices shown, 15 images]
[im 5/64  mediastinal]
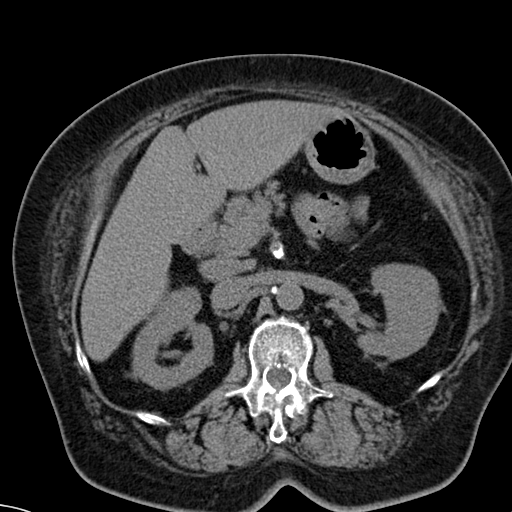
[im 5/64  lung]
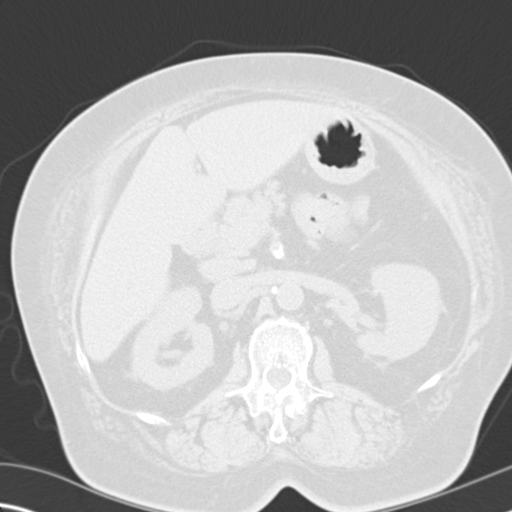
[im 10/64  lung]
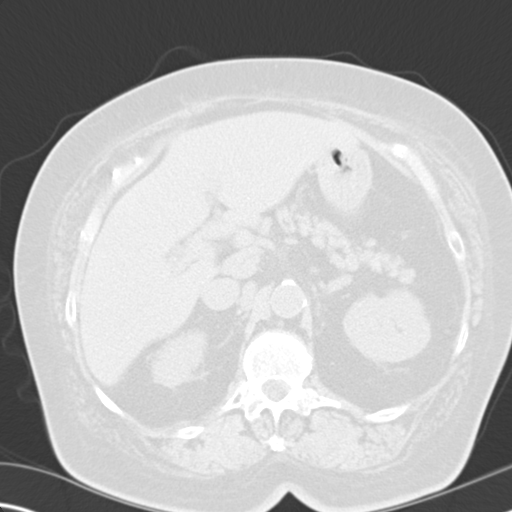
[im 15/64  lung]
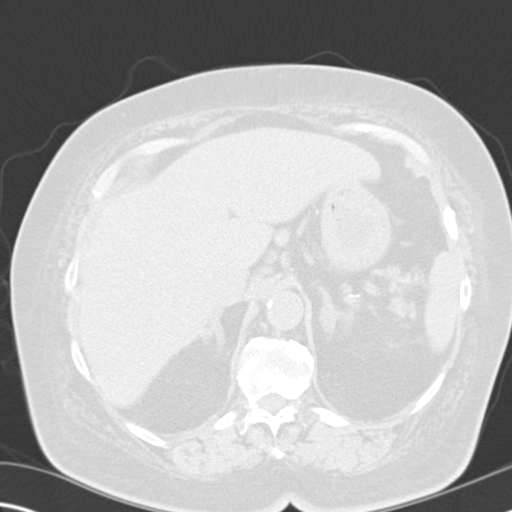
[im 20/64  lung]
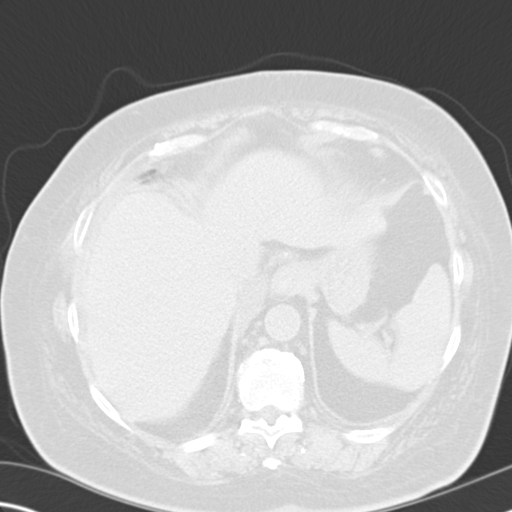
[im 25/64  mediastinal]
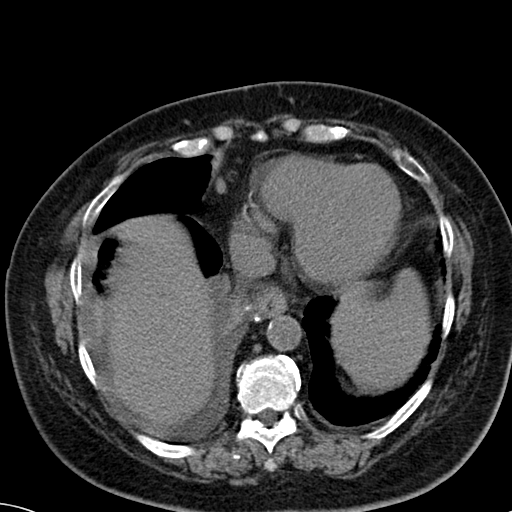
[im 25/64  lung]
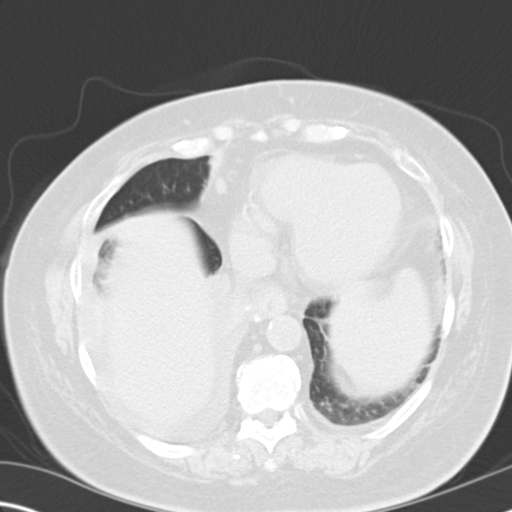
[im 30/64  lung]
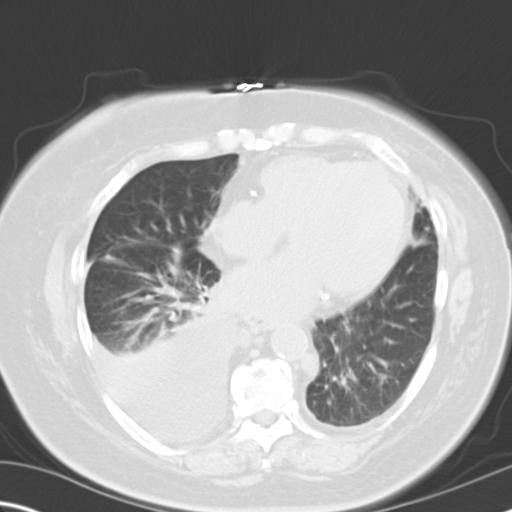
[im 34/64  lung]
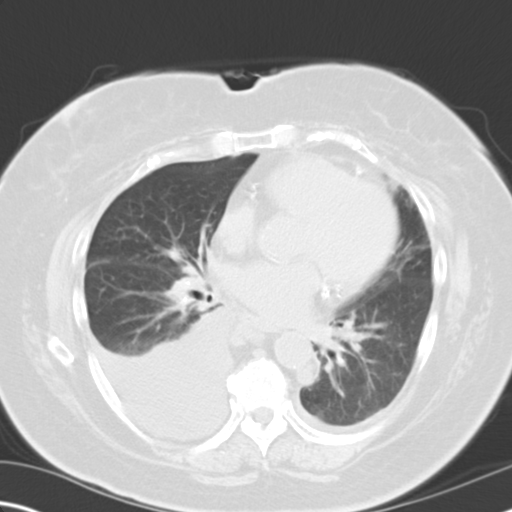
[im 39/64  lung]
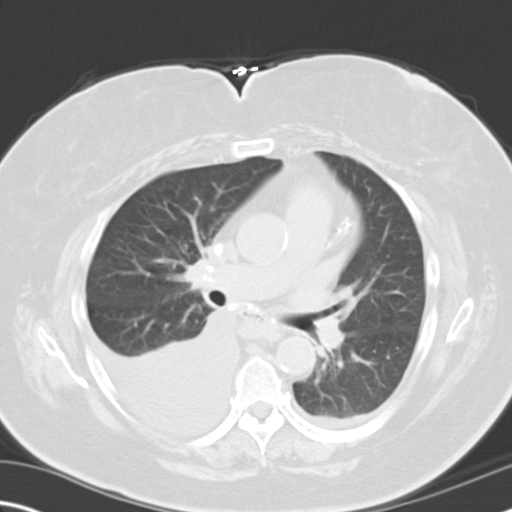
[im 44/64  mediastinal]
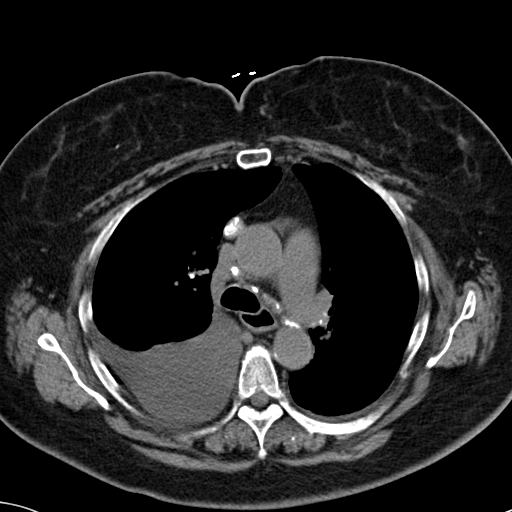
[im 44/64  lung]
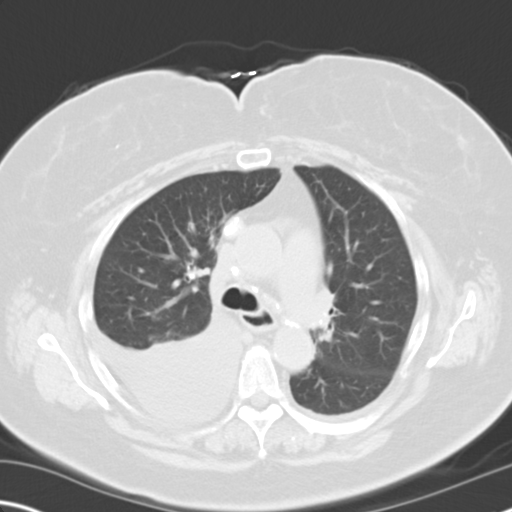
[im 49/64  lung]
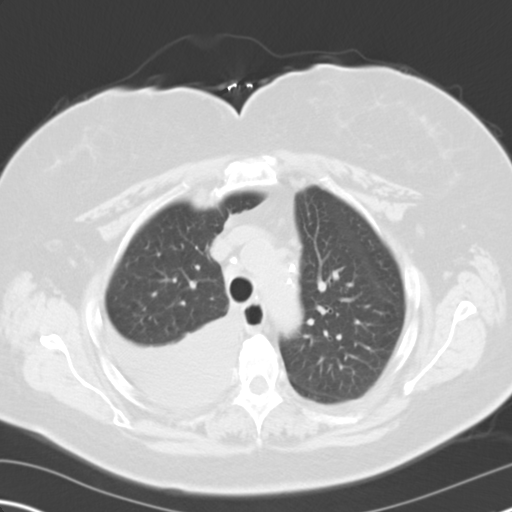
[im 54/64  lung]
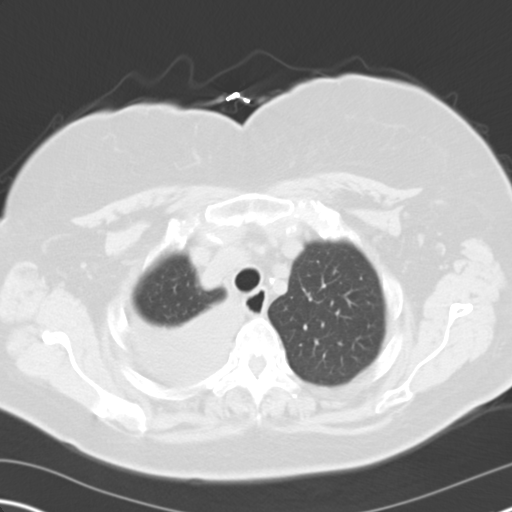
[im 59/64  lung]
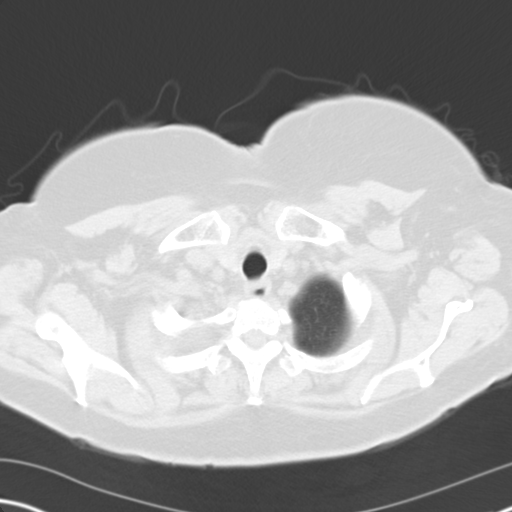

[Series 5: cor routine chest wo · coronal · 0.68mm/px · 3 of 135 slices shown]
[im 27/135  lung]
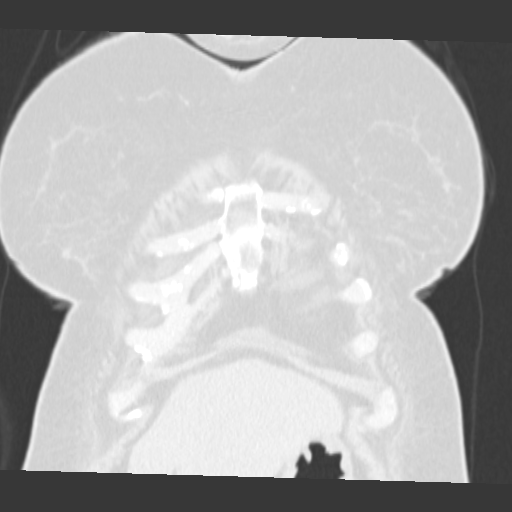
[im 54/135  lung]
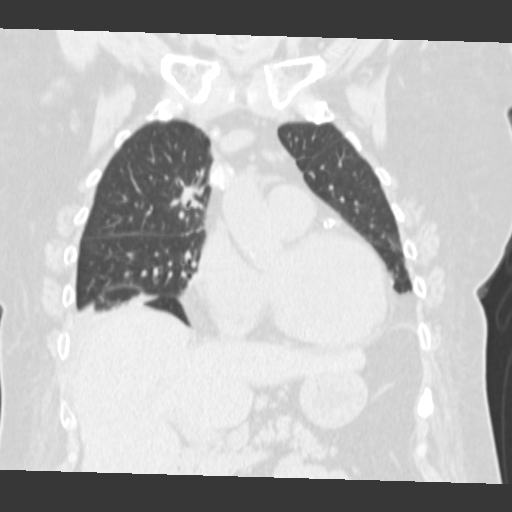
[im 81/135  lung]
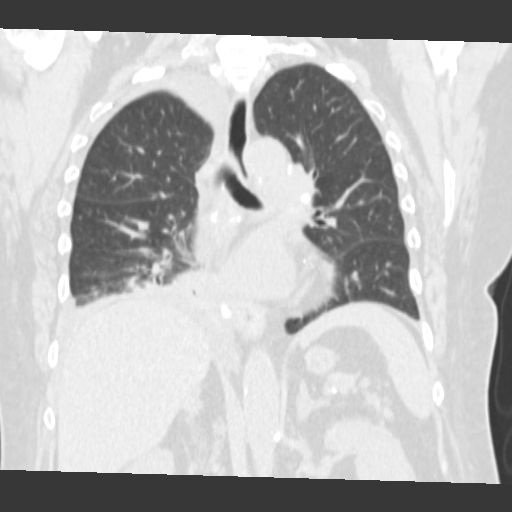

[15 of 36 positions shown; findings below may reference images not displayed]

FINDINGS: 12 x 8 mm central right upper lobe pulmonary nodule (series 3/image
23), unchanged. The spiculated appearance on prior studies is less
evident currently.

Moderate right pleural effusion, increased. Underlying right lower
lobe atelectasis.

Trace left pleural effusion, new. No pneumothorax.

Visualized thyroid is unremarkable.

Heart is normal in size. No pericardial effusion. Coronary
atherosclerosis. Atherosclerotic calcifications of the aortic arch.

Calcified mediastinal and bilateral hilar lymph nodes. No suspicious
lymphadenopathy.

Visualized upper abdomen is notable for mild thickening of the
medial limb the of the left adrenal gland (series 2/ image 52),
unchanged. Vascular calcifications.

Degenerative changes of the visualized thoracolumbar spine.
IMPRESSION: 12 x 8 mm central right upper lobe pulmonary nodule, unchanged.

Moderate right pleural effusion, increased. Underlying right lower
lobe atelectasis.

Trace left pleural effusion, new.

10 month stability of the RUL nodule has been demonstrated. By
strict Fleischner criteria, given size and greater than 9 month
stability, follow-up imaging would be suggested in 14 months (24
months from initial CT). However, given the prior spiculated
appearance, consider interval follow-up CT chest (or preferably
PET-CT) in 6 months.

## 2015-07-09 ENCOUNTER — Telehealth: Payer: Self-pay

## 2015-07-09 DIAGNOSIS — I509 Heart failure, unspecified: Secondary | ICD-10-CM | POA: Diagnosis not present

## 2015-07-09 DIAGNOSIS — J189 Pneumonia, unspecified organism: Secondary | ICD-10-CM | POA: Diagnosis not present

## 2015-07-09 DIAGNOSIS — E1122 Type 2 diabetes mellitus with diabetic chronic kidney disease: Secondary | ICD-10-CM | POA: Diagnosis not present

## 2015-07-09 DIAGNOSIS — J440 Chronic obstructive pulmonary disease with acute lower respiratory infection: Secondary | ICD-10-CM | POA: Diagnosis not present

## 2015-07-09 DIAGNOSIS — J209 Acute bronchitis, unspecified: Secondary | ICD-10-CM | POA: Diagnosis not present

## 2015-07-09 DIAGNOSIS — I130 Hypertensive heart and chronic kidney disease with heart failure and stage 1 through stage 4 chronic kidney disease, or unspecified chronic kidney disease: Secondary | ICD-10-CM | POA: Diagnosis not present

## 2015-07-09 NOTE — Telephone Encounter (Signed)
My apologies, [phone]

## 2015-07-09 NOTE — Telephone Encounter (Signed)
Pamela Collier from [facility] called and wanted to speak with you about Pamela Collier. Julia's number is [phone]. Please advise, thanks

## 2015-07-09 NOTE — Telephone Encounter (Signed)
[phone]?  

## 2015-07-10 ENCOUNTER — Other Ambulatory Visit: Payer: Self-pay | Admitting: *Deleted

## 2015-07-10 ENCOUNTER — Telehealth: Payer: Self-pay

## 2015-07-10 ENCOUNTER — Encounter: Payer: Self-pay | Admitting: *Deleted

## 2015-07-10 DIAGNOSIS — J189 Pneumonia, unspecified organism: Secondary | ICD-10-CM | POA: Diagnosis not present

## 2015-07-10 DIAGNOSIS — J209 Acute bronchitis, unspecified: Secondary | ICD-10-CM | POA: Diagnosis not present

## 2015-07-10 DIAGNOSIS — I130 Hypertensive heart and chronic kidney disease with heart failure and stage 1 through stage 4 chronic kidney disease, or unspecified chronic kidney disease: Secondary | ICD-10-CM | POA: Diagnosis not present

## 2015-07-10 DIAGNOSIS — I509 Heart failure, unspecified: Secondary | ICD-10-CM | POA: Diagnosis not present

## 2015-07-10 DIAGNOSIS — E1122 Type 2 diabetes mellitus with diabetic chronic kidney disease: Secondary | ICD-10-CM | POA: Diagnosis not present

## 2015-07-10 DIAGNOSIS — J440 Chronic obstructive pulmonary disease with acute lower respiratory infection: Secondary | ICD-10-CM | POA: Diagnosis not present

## 2015-07-10 NOTE — Telephone Encounter (Signed)
Criteria, has changed needs to be end-stage dementia, active cancer or something meet criteria of 6 months to one year.

## 2015-07-10 NOTE — Patient Outreach (Signed)
[facility]) Care Management  07/10/2015  DARIYAH GURSKY [birth year] [id number]   Phone call to patient to assess for continued social work needs.  Per patient, she was recently released from the hospital and is now receiving home health services with [facility].  Patient verbalized having no further social work needs at this time.  Patient's Advanced Directive completed, however still needs to be notarized.  This Education officer, museum suggested that she take the document to her local bank or doctor's office to get the document notarized.  Patient agrees and states that she understands the importance of this and will get the document notarized on her own.    [name] [facility] Care Management [phone]

## 2015-07-10 NOTE — Telephone Encounter (Signed)
See other note

## 2015-07-10 NOTE — Telephone Encounter (Signed)
Gregary Signs from [facility] was calling about Pamela Collier she was returning [place] call. Julia's number is [phone]

## 2015-07-10 NOTE — Telephone Encounter (Signed)
She doesn't qualify. Can we try [facility]? For financial assistance in managing medications, illness

## 2015-07-10 NOTE — Patient Outreach (Signed)
Transition of care call (week 2, discharged 2/17).   Pt reports feeling pretty good, diarrhea stopped, completed antibiotic.  Pt reports therapy from [facility] PT going good.  Pt reports to f/u with Heart MD 3/2, have chest xray done tomorrow.   RN CM discussed with pt doing a home visit (part of transition of care) to which pt agreed, home visit scheduled for 3/6.      Pamela Collier.   [place] Care Management  [phone]

## 2015-07-11 ENCOUNTER — Telehealth: Payer: Self-pay | Admitting: Pulmonary Disease

## 2015-07-11 ENCOUNTER — Ambulatory Visit
Admission: RE | Admit: 2015-07-11 | Discharge: 2015-07-11 | Disposition: A | Discharge status: Home / Self Care | Payer: Medicare Other | Source: Ambulatory Visit | Attending: Pulmonary Disease | Admitting: Pulmonary Disease

## 2015-07-11 DIAGNOSIS — J209 Acute bronchitis, unspecified: Secondary | ICD-10-CM | POA: Diagnosis not present

## 2015-07-11 DIAGNOSIS — J9811 Atelectasis: Secondary | ICD-10-CM | POA: Insufficient documentation

## 2015-07-11 DIAGNOSIS — I700 Atherosclerosis of aorta: Secondary | ICD-10-CM | POA: Insufficient documentation

## 2015-07-11 DIAGNOSIS — J90 Pleural effusion, not elsewhere classified: Secondary | ICD-10-CM | POA: Insufficient documentation

## 2015-07-11 DIAGNOSIS — R911 Solitary pulmonary nodule: Secondary | ICD-10-CM

## 2015-07-11 DIAGNOSIS — J440 Chronic obstructive pulmonary disease with acute lower respiratory infection: Secondary | ICD-10-CM | POA: Diagnosis not present

## 2015-07-11 DIAGNOSIS — E1122 Type 2 diabetes mellitus with diabetic chronic kidney disease: Secondary | ICD-10-CM | POA: Diagnosis not present

## 2015-07-11 DIAGNOSIS — R918 Other nonspecific abnormal finding of lung field: Secondary | ICD-10-CM | POA: Insufficient documentation

## 2015-07-11 DIAGNOSIS — J189 Pneumonia, unspecified organism: Secondary | ICD-10-CM | POA: Diagnosis not present

## 2015-07-11 DIAGNOSIS — I509 Heart failure, unspecified: Secondary | ICD-10-CM | POA: Diagnosis not present

## 2015-07-11 DIAGNOSIS — I130 Hypertensive heart and chronic kidney disease with heart failure and stage 1 through stage 4 chronic kidney disease, or unspecified chronic kidney disease: Secondary | ICD-10-CM | POA: Diagnosis not present

## 2015-07-11 NOTE — Telephone Encounter (Signed)
Notified [facility] that we can't qualify patient for services. Patient has been referred to [employer]

## 2015-07-11 NOTE — Telephone Encounter (Signed)
Expand All Collapse All   A, Can you call to see how we can help with her oxygen supplies? Thanks B         Pt has ov on 07/16/15 with BQ. lmtcb X1 to see what problem she is having with her 02 supplies.

## 2015-07-12 ENCOUNTER — Encounter: Payer: Self-pay | Admitting: Nurse Practitioner

## 2015-07-12 ENCOUNTER — Ambulatory Visit (INDEPENDENT_AMBULATORY_CARE_PROVIDER_SITE_OTHER): Payer: Medicare Other | Admitting: Nurse Practitioner

## 2015-07-12 VITALS — BP 94/44 | HR 59 | Ht 66.0 in | Wt 192.8 lb

## 2015-07-12 DIAGNOSIS — I119 Hypertensive heart disease without heart failure: Secondary | ICD-10-CM | POA: Insufficient documentation

## 2015-07-12 DIAGNOSIS — I25119 Atherosclerotic heart disease of native coronary artery with unspecified angina pectoris: Secondary | ICD-10-CM | POA: Diagnosis not present

## 2015-07-12 DIAGNOSIS — I5032 Chronic diastolic (congestive) heart failure: Secondary | ICD-10-CM | POA: Diagnosis not present

## 2015-07-12 DIAGNOSIS — R0602 Shortness of breath: Secondary | ICD-10-CM | POA: Diagnosis not present

## 2015-07-12 DIAGNOSIS — I255 Ischemic cardiomyopathy: Secondary | ICD-10-CM

## 2015-07-12 DIAGNOSIS — I2510 Atherosclerotic heart disease of native coronary artery without angina pectoris: Secondary | ICD-10-CM | POA: Diagnosis not present

## 2015-07-12 DIAGNOSIS — I110 Hypertensive heart disease with heart failure: Secondary | ICD-10-CM

## 2015-07-12 DIAGNOSIS — E785 Hyperlipidemia, unspecified: Secondary | ICD-10-CM | POA: Diagnosis not present

## 2015-07-12 NOTE — Patient Instructions (Signed)
Medication Instructions:  Your physician recommends that you continue on your current medications as directed. Please refer to the Current Medication list given to you today.   Labwork: none  Testing/Procedures: none  Follow-Up: Your physician wants you to follow-up in: six months with Dr. Gollan. You will receive a reminder letter in the mail two months in advance. If you don't receive a letter, please call our office to schedule the follow-up appointment.   Any Other Special Instructions Will Be Listed Below (If Applicable).     If you need a refill on your cardiac medications before your next appointment, please call your pharmacy.   

## 2015-07-12 NOTE — Progress Notes (Signed)
Office Visit    Patient [name] Date of Encounter: 07/12/2015  Primary Care Provider:  [name], MD Primary Cardiologist:  Johnny Bridge, MD   Chief Complaint    75 y/o ? with a h/o CAD and CHF who presents for f/u.  Past Medical History    Past Medical History  Diagnosis Date  . Coronary artery disease, occlusive     a. Previous PCI to the LAD, LCx, and RCA in 2010, 2011, 2013, and 2016. All performed in [place].  . Cardiomyopathy, ischemic     a. EF 35 to 40% by echo in 2013 b. EF improved to 50-55% by echo in 04/2015.  [name] Hepatic steatosis     by CT abd pelvis  . Osteoporosis, post-menopausal   . 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 Atherosclerotic peripheral vascular disease with gangrene [facility]) august 2012  . Hypertensive heart disease   . Sarcoidosis ([place])   . Chronic diastolic CHF (congestive heart failure) (HCC)     a. EF 50-55% by echo in 04/2015  . Renal insufficiency   . 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 Acute respiratory failure ([place]) 11/21/2014  . Diabetic diarrhea ([place]) 10/03/2014  . DM type 2, uncontrolled, with renal complications ([place]) [id number]  . Cerebral infarct ([place]) 08/17/2013  . Arthritis 08/05/2013  .  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 Hyperlipidemia LDL goal <100 02/23/2014  . COPD (chronic obstructive pulmonary disease) [facility])    Past Surgical History  Procedure Laterality Date  . Abdominal hysterectomy      at ge 39. secondary to bleeding  . Toe amputation  Sept 2012    Right 2nd toe, [place]  . Ptca  August 2012    Right Posterior tibial artery , [place]  . Colonoscopy with propofol N/A 01/09/2015    Procedure: COLONOSCOPY WITH PROPOFOL;  Surgeon: Lucilla Lame, MD;  Location: [facility] ENDOSCOPY;  Service: Endoscopy;  Laterality: N/A;  . Cholecystectomy    . Carpal tunnel release Bilateral   . Breast cyst aspiration Right   . Coronary angioplasty with stent placement      [place]; [facility]    Allergies  Allergies  Allergen Reactions  . Contrast Media [Iodinated Diagnostic Agents] Itching  . Gabapentin Itching  . Sulfa Antibiotics Itching    History of Present Illness    75 y/o ? with the above complex PMH.  She has a h/o CAD s/p multiple intervention in [place], [place], the last of which in 2016.  She also has a h/o ICM and CHF with subsequent normalization of LV fxn by echo in 04/2015 (EF 50-55%).  She was last seen in clinic by Johnny Bridge, MD in December and since then, was  admitted to [facility] for PNA and right pleural effusion req thoracentesis.  She was subsequently d/c'd in late Feb and since then has been doing well.  She feels that her breathing has been at baseline, with good days and bad days.  Her weight has been stable and she denies chest pain, palpitations, pnd, orthopnea, n, v, dizziness, syncope, edema, weight gain, or early satiety.   Home Medications    Prior to Admission medications   Medication Sig Start Date End Date Taking? Authorizing Provider  acetaminophen (TYLENOL) 325 MG tablet Take 2 tablets (650 mg total) by mouth every 6 (six) hours as needed for mild pain (headache). 06/29/15  Yes Demetrios Loll, MD  albuterol (PROVENTIL) (2.5 MG/3ML)  0.083% nebulizer solution Take 3 mLs (2.5 mg total) by nebulization every 6 (six) hours as needed for wheezing or shortness of breath. 12/06/14  Yes [name], MD  ALPRAZolam [name]) 0.5 MG tablet Take 1 tablet (0.5 mg total) by mouth daily as needed for sleep or anxiety. 12/01/14  Yes [name], MD  aspirin 81 MG EC tablet Take 1 tablet (81 mg total) by mouth daily. Swallow whole. 04/18/12  Yes [name], MD  benzonatate (TESSALON) 100 MG capsule TAKE TWO CAPSULES BY MOUTH THREE TIMES DAILY AS NEEDED FOR COUGH 05/28/15  Yes [name], MD  BRILINTA 90 MG TABS tablet Take 1 tablet (90 mg total) by mouth 2 (two) times daily. 12/06/14  Yes [name], MD  budesonide-formoterol (SYMBICORT) 160-4.5 MCG/ACT inhaler Inhale 2 puffs into the lungs 2 (two) times daily. 02/23/15  Yes Juanito Doom, MD  COMBIVENT RESPIMAT 20-100 MCG/ACT AERS respimat Inhale 1 puff into the lungs 3 (three) times daily as needed. Patient taking differently: Inhale 1 puff into the lungs 3 (three) times daily as needed for wheezing or shortness of breath.  12/06/14  Yes [name], MD  ergocalciferol (DRISDOL) 50000 units capsule Take 1 capsule (50,000 Units total) by mouth once a week. With Sunday dinner 05/17/15  Yes [name], MD  fluticasone (FLONASE) 50 MCG/ACT nasal spray Place 2 sprays into both nostrils daily. 12/06/14  Yes [name], MD  furosemide (LASIX) 20 MG tablet Take 1 tablet (20 mg total) by mouth daily. 12/01/14  Yes [name], MD  insulin aspart protamine - aspart (NOVOLOG 70/30 MIX) (70-30) 100 UNIT/ML FlexPen 60 units before breakfast,  60 units before dinner.  NEEDS 15 PENS PER MONTH 12/06/14  Yes [name], MD  isosorbide mononitrate (IMDUR) 30 MG 24 hr tablet TAKE 1 TABLET DAILY 06/08/15  Yes [name], MD  losartan (COZAAR) 100 MG tablet Take 1 tablet (100 mg total) by mouth daily. 12/01/14  Yes [name], MD  LYRICA 100 MG capsule TAKE ONE CAPSULE BY MOUTH  THREE TIMES DAILY 05/23/15  Yes [name], MD  metoprolol tartrate (LOPRESSOR) 12.5 mg TABS tablet Take 12.5 mg by mouth 2 (two) times daily. Hold for SBP <110 and HR <60/min   Yes Historical Provider, MD  nitroGLYCERIN (NITROSTAT) 0.4 MG SL tablet Place 1 tablet (0.4 mg total) under the tongue every 5 (five) minutes as needed for chest pain. 04/18/15  Yes Minna Merritts, MD  PARoxetine (PAXIL) 10 MG tablet Take 1 tablet (10 mg total) by mouth daily. 12/06/14  Yes [name], MD  rosuvastatin (CRESTOR) 20 MG tablet Take 1 tablet (20 mg total) by mouth daily. Patient taking differently: Take 20 mg  by mouth at bedtime.  12/01/14  Yes [name], MD  Spacer/Aero-Holding Chambers (AEROCHAMBER MV) inhaler Use as instructed 02/23/15  Yes Juanito Doom, MD  traMADol (ULTRAM) 50 MG tablet Take 1 tablet (50 mg total) by mouth every 6 (six) hours as needed for moderate pain. 06/29/15  Yes Demetrios Loll, MD    Review of Systems    Chronic, stable DOE.  She denies chest pain, palpitations, pnd, orthopnea, n, v, dizziness, syncope, edema, weight gain, or early satiety.   All other systems reviewed and are otherwise negative except as noted above.  Physical Exam    VS:  BP 94/44 mmHg  Pulse 59  Ht 5\' 6"  (1.676 m)  Wt 192 lb 12 oz (87.431 kg)  BMI 31.13 kg/m2 , BMI Body mass index is 31.13 kg/(m^2). GEN: Well nourished, well developed, in no acute distress. HEENT: normal. Neck: Supple, no JVD, carotid bruits, or masses. Cardiac: RRR, 2/6 SEM LUSB, no rubs, or gallops. No clubbing, cyanosis, edema.  Radials/DP/PT 2+ and equal bilaterally.  Respiratory:  Respirations regular and unlabored, clear to auscultation bilaterally. GI: Soft, nontender, nondistended, BS + x 4. MS: no deformity or atrophy. Skin: warm and dry, no rash. Neuro:  Strength and sensation are intact. Psych: Normal affect.  Accessory Clinical Findings    ECG - RSR, 60, 1st deg AVB, LAD, LVH, TWI I/aVL. No acute ST/T  changes.  Assessment & Plan    1.  CAD:  Pt has been doing well since her last clinic visit in Dec 2016 w/o any significant chest pain. She remains on asa, bb, arb, brilinta, nitrate, and statin therapy.  2.  Chronic Diastolic CHF:  H/o LV dysfxn with subsequent normalization  confirmed by recent echo in 04/2015.  Wt has been stable @ home.  She has chronic DOE but overall is doing well.  Euvolemic on exam.  HR/BP stable.  Cont  blocker, ARB, and low dose lasix.  3.  Hypertensive Heart Disease:  BP stable on  blocker, ARB, lasix, nitrate.  4.  HL:  On crestor 20.  LDL recently 89 w/ nl LFT's.  5.  Pulmonary Sarcoidosis:  CT yesterday and f/u with pulmonology on Monday to review.  6.  Dispo:  F/U Dr. Rockey Situ in six months or sooner if necessary.   Murray Hodgkins, NP 07/12/2015, 4:22 PM

## 2015-07-12 NOTE — Telephone Encounter (Signed)
A,    Please let the patient know this was OK    Thanks,    B    lmtcb X2 with pt to follow up on 02 supplies and make aware of CT chest results.

## 2015-07-13 DIAGNOSIS — J440 Chronic obstructive pulmonary disease with acute lower respiratory infection: Secondary | ICD-10-CM | POA: Diagnosis not present

## 2015-07-13 DIAGNOSIS — J189 Pneumonia, unspecified organism: Secondary | ICD-10-CM | POA: Diagnosis not present

## 2015-07-13 DIAGNOSIS — I509 Heart failure, unspecified: Secondary | ICD-10-CM | POA: Diagnosis not present

## 2015-07-13 DIAGNOSIS — E1122 Type 2 diabetes mellitus with diabetic chronic kidney disease: Secondary | ICD-10-CM | POA: Diagnosis not present

## 2015-07-13 DIAGNOSIS — I130 Hypertensive heart and chronic kidney disease with heart failure and stage 1 through stage 4 chronic kidney disease, or unspecified chronic kidney disease: Secondary | ICD-10-CM | POA: Diagnosis not present

## 2015-07-13 DIAGNOSIS — J209 Acute bronchitis, unspecified: Secondary | ICD-10-CM | POA: Diagnosis not present

## 2015-07-16 ENCOUNTER — Other Ambulatory Visit: Payer: Self-pay | Admitting: *Deleted

## 2015-07-16 ENCOUNTER — Encounter: Payer: Self-pay | Admitting: Pulmonary Disease

## 2015-07-16 ENCOUNTER — Other Ambulatory Visit: Payer: Self-pay | Admitting: Internal Medicine

## 2015-07-16 ENCOUNTER — Ambulatory Visit (INDEPENDENT_AMBULATORY_CARE_PROVIDER_SITE_OTHER): Payer: Medicare Other | Admitting: Pulmonary Disease

## 2015-07-16 VITALS — BP 132/72 | HR 57 | Ht 63.0 in | Wt 194.0 lb

## 2015-07-16 DIAGNOSIS — D860 Sarcoidosis of lung: Secondary | ICD-10-CM | POA: Diagnosis not present

## 2015-07-16 DIAGNOSIS — G4733 Obstructive sleep apnea (adult) (pediatric): Secondary | ICD-10-CM

## 2015-07-16 DIAGNOSIS — R911 Solitary pulmonary nodule: Secondary | ICD-10-CM

## 2015-07-16 DIAGNOSIS — I255 Ischemic cardiomyopathy: Secondary | ICD-10-CM | POA: Diagnosis not present

## 2015-07-16 DIAGNOSIS — J90 Pleural effusion, not elsewhere classified: Secondary | ICD-10-CM | POA: Diagnosis not present

## 2015-07-16 DIAGNOSIS — Z9989 Dependence on other enabling machines and devices: Secondary | ICD-10-CM

## 2015-07-16 NOTE — Assessment & Plan Note (Signed)
She has ischemic cardiomyopathy which was initially treated in [place]. I see from my review of the hospital records that she is going to follow-up with Dr. Rockey Situ. I completely agree with this.

## 2015-07-16 NOTE — Assessment & Plan Note (Signed)
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 As I noted multiple times in previous notes I'm not really sure why she has this but I believe that is related to her diagnosis of sarcoidosis and previous mediastinal surgery. At this time I don't think there is a role for any sort of procedure like a pleurodesis as she's only needed to have therapeutic thoracenteses done about once every 2 years.

## 2015-07-16 NOTE — Addendum Note (Signed)
Addended by: Len Blalock on: 07/16/2015 04:48 PM   Modules accepted: Orders

## 2015-07-16 NOTE — Assessment & Plan Note (Signed)
The CT scan performed on March 1 showed that this nodule has been stable since 2015. No further imaging needed.

## 2015-07-16 NOTE — Assessment & Plan Note (Signed)
Pamela Collier has obstructive sleep apnea and remains compliant with her CPAP machine. We will reorder supplies for it today. I will have her follow-up with a sleep specialist next time, I recommended Dr. Ashby Dawes in [place]. She specifically requests to be transferred to the [place] office for her convenience as well.

## 2015-07-16 NOTE — Telephone Encounter (Signed)
Pt seen in office today, this was discussed and handled in office visit.  Nothing further needed.

## 2015-07-16 NOTE — Patient Instructions (Signed)
We will have you follow-up with Dr. Ashby Dawes in the [place] office in 3 months We will reorder CPAP supplies

## 2015-07-16 NOTE — Telephone Encounter (Signed)
Tried calling patient to ask if she was taking because it was discontinued from her list, but there was no answer.

## 2015-07-16 NOTE — Patient Outreach (Signed)
[facility]) Care Management   07/16/2015  Pamela Collier 20-May-1940 [id number]  Pamela Collier is an 75 y.o. female  Subjective:  Pt reports f/u with Heart MD last week, good report and to see Lung MD today.  Pt reports HH PT continues to work with her, feels stronger since hospital discharge plus  [facility] RN still coming.  Pt reports no sob.  Pt reports sugar today 162, plan to bring her readings to Dr. Lupita Dawn office.  Pt reports occasional stomach pain before eating, started since hospital discharge, eats and it goes away, did not let MD know.    Objective:   Filed Vitals:   07/16/15 1337  BP: 118/62  Pulse: 58  Resp: 16    ROS  Physical Exam  Constitutional: She is oriented to person, place, and time. She appears well-developed and well-nourished.  Cardiovascular: Regular rhythm and normal heart sounds.   Respiratory: Effort normal and breath sounds normal.  GI: Soft. Bowel sounds are normal.  Musculoskeletal: Normal range of motion.  Neurological: She is alert and oriented to person, place, and time.  Skin: Skin is warm and dry.  Psychiatric: She has a normal mood and affect. Her behavior is normal. Judgment and thought content normal.    Current Medications:  Reviewed with pt  Current Outpatient Prescriptions  Medication Sig Dispense Refill  . acetaminophen (TYLENOL) 325 MG tablet Take 2 tablets (650 mg total) by mouth every 6 (six) hours as needed for mild pain (headache). 20 tablet 0  . albuterol (PROVENTIL) (2.5 MG/3ML) 0.083% nebulizer solution Take 3 mLs (2.5 mg total) by nebulization every 6 (six) hours as needed for wheezing or shortness of breath. 150 mL 1  . ALPRAZolam (XANAX) 0.5 MG tablet Take 1 tablet (0.5 mg total) by mouth daily as needed for sleep or anxiety. 90 tablet 0  . aspirin 81 MG EC tablet Take 1 tablet (81 mg total) by mouth daily. Swallow whole. 30 tablet 12  . benzonatate (TESSALON) 100 MG capsule TAKE TWO CAPSULES BY MOUTH THREE  TIMES DAILY AS NEEDED FOR COUGH 90 capsule 0  . BRILINTA 90 MG TABS tablet Take 1 tablet (90 mg total) by mouth 2 (two) times daily. 180 tablet 2  . budesonide-formoterol (SYMBICORT) 160-4.5 MCG/ACT inhaler Inhale 2 puffs into the lungs 2 (two) times daily. 1 Inhaler 0  . ergocalciferol (DRISDOL) 50000 units capsule Take 1 capsule (50,000 Units total) by mouth once a week. With Sunday dinner 4 capsule 0  . fluticasone (FLONASE) 50 MCG/ACT nasal spray Place 2 sprays into both nostrils daily. 16 g 2  . furosemide (LASIX) 20 MG tablet Take 1 tablet (20 mg total) by mouth daily. 90 tablet 1  . insulin aspart protamine - aspart (NOVOLOG 70/30 MIX) (70-30) 100 UNIT/ML FlexPen 60 units before breakfast,  60 units before dinner.  NEEDS 15 PENS PER MONTH 105 mL 2  . isosorbide mononitrate (IMDUR) 30 MG 24 hr tablet TAKE 1 TABLET DAILY 90 tablet 1  . losartan (COZAAR) 100 MG tablet Take 1 tablet (100 mg total) by mouth daily. 90 tablet 2  . LYRICA 100 MG capsule TAKE ONE CAPSULE BY MOUTH THREE TIMES DAILY 270 capsule 0  . metoprolol tartrate (LOPRESSOR) 12.5 mg TABS tablet Take 12.5 mg by mouth 2 (two) times daily. Hold for SBP <110 and HR <60/min    . PARoxetine (PAXIL) 10 MG tablet Take 1 tablet (10 mg total) by mouth daily. 90 tablet 1  . rosuvastatin (  CRESTOR) 20 MG tablet Take 1 tablet (20 mg total) by mouth daily. (Patient taking differently: Take 20 mg by mouth at bedtime. ) 90 tablet 1  . Spacer/Aero-Holding Chambers (AEROCHAMBER MV) inhaler Use as instructed 1 each 0  . COMBIVENT RESPIMAT 20-100 MCG/ACT AERS respimat Inhale 1 puff into the lungs 3 (three) times daily as needed. (Patient not taking: Reported on 07/16/2015) 2 Inhaler 3  . nitroGLYCERIN (NITROSTAT) 0.4 MG SL tablet Place 1 tablet (0.4 mg total) under the tongue every 5 (five) minutes as needed for chest pain. 25 tablet 3  . traMADol (ULTRAM) 50 MG tablet Take 1 tablet (50 mg total) by mouth every 6 (six) hours as needed for moderate pain.  (Patient not taking: Reported on 07/16/2015) 16 tablet 0   No current facility-administered medications for this visit.   Facility-Administered Medications Ordered in Other Visits  Medication Dose Route Frequency Provider Last Rate Last Dose  . albuterol (PROVENTIL) (5 MG/ML) 0.5% nebulizer solution 2.5 mg  2.5 mg Nebulization Once [name], MD        Functional Status:   In your present state of health, do you have any difficulty performing the following activities: 07/16/2015 06/26/2015  Hearing? N N  Vision? N N  Difficulty concentrating or making decisions? N N  Walking or climbing stairs? Y Y  Dressing or bathing? N N  Doing errands, shopping? N N  Preparing Food and eating ? Y -  Using the Toilet? N -  In the past six months, have you accidently leaked urine? N -  Do you have problems with loss of bowel control? N -  Managing your Medications? N -  Managing your Finances? N -  Housekeeping or managing your Housekeeping? N -    Fall/Depression Screening:    PHQ 2/9 Scores 10/30/2014 10/30/2014 10/20/2014 07/23/2014 11/22/2013 08/03/2013 04/03/2012  PHQ - 2 Score 0 0 _0 0 2  PHQ- 9 Score - - 5 - 3 - 10  Exception Documentation - - - Other- indicate reason in comment box - - -    Assessment:   Recent hospitalization (PNA). Lungs clear today, no sob observed ambulating.  O2 sat 98 % at rest.                           DM- view of pt's blood sugar readings: 162 today, ranges 124-204 am, 109-171 pm                          HF- no edema, weight today 192 lbs.   Plan:  Pt to f/u with Dr. [place] Bells today.            Pt to inform Dr. Derrel Nip of occasional stomach pains (before eating) since hospital discharge.             Plan to continue to follow pt for transition of care, next contact telephonically 3/13.             Plan to route today's encounter in Epic to Dr. Derrel Nip.    [facility] CM Care Plan Problem One        Most Recent Value   Care Plan Problem One  Risk for readmission related  due to recent hospitalization, 2 within 3 months    Role Documenting the Problem One  Care Management [place] for Problem One  Active   [facility] Long  Term Goal (31-90 days)  Pt will not readmit 31 days from day of discharge    [place] Term Goal Start Date  07/02/15   Interventions for Problem One Long Term Goal  Discussed with pt plan to follow with transition of care program (weekly phone calls, home visit )   [facility] CM Short Term Goal #1 (0-30 days)  Pt will inform MD of occassional stomach pains before eating within next 14 days    [facility] CM Short Term Goal #1 Start Date  07/16/15   Interventions for Short Term Goal #1  RN CM encouraged pt to inform Primary Care MD of  occassional stomach pain (since discharge) within next 14 days    THN CM Short Term Goal #2 (0-30 days)  Pt would continue to do well with [facility] PT in the next 2 weeks    THN CM Short Term Goal #2 Start Date  07/10/15   [facility] CM Short Term Goal #2 Met Date  07/16/15   Interventions for Short Term Goal #2  Commended pt on working with [facility] PT.      Zara Chess.   [place] Care Management  [phone]

## 2015-07-16 NOTE — Progress Notes (Signed)
Subjective:    Patient ID: Pamela Collier, female    DOB: 1941-01-26, 75 y.o.   MRN: [id number]  Synopsis: This is a 75 year old female first seen by Dr. Melvyn Novas in the [facility] pulmonary office in January 2014 for pulmonary nodule and some shortness of breath. An ACE inhibitor was held during that visit and followup imaging was planned.  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 She had coronary artery disease diagnosed in 2016 while visiting family in [place] and she had a stent placed in her right coronary artery. She has been followed off and on in the pulmonary clinic since 2014 for a waxing and waning chylothorax in the right lung as well as mediastinal lymphadenopathy which was felt to be consistent with her sarcoid.  HPI Chief Complaint  Patient presents with  . Follow-up    pt c/o sob with exertion.  Pt hospitalized last month at [facility] for PNA.  Pt needs this ov for cpap supplies.  Pt wishes to switch to a BT provider d/t location.     Pamela Collier says sh ehas been doing [place].  She had pneumonia in Valentines day and stayed in [facility] for three days for pneumonia at that point.  She is getting better.  She had a recurrent CT scan of her chest today. She saw cardiology in the hospital at [facility] and is planning to see Dr. Rockey Situ for a stress test here in [place]. She is here today to renew her sleep apnea supplies. She remains compliant with her CPAP mask but has not received a new one in over a year.   Past Medical History  Diagnosis Date  . Coronary artery disease, occlusive     a. Previous PCI to the LAD, LCx, and RCA in 2010, 2011, 2013, and 2016. All performed in [place].  . Cardiomyopathy, ischemic     a. EF 35 to 40% by echo in 2013 b. EF improved to 50-55% by echo in 04/2015.  [name] Hepatic steatosis     by CT abd pelvis  . Osteoporosis, post-menopausal   . 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 Atherosclerotic peripheral vascular disease with gangrene [facility]) august 2012  . Hypertensive heart disease   . Sarcoidosis ([place])   . Chronic diastolic CHF (congestive heart failure) (HCC)     a. EF 50-55% by echo in 04/2015  . Renal insufficiency   . Pleural effusion 10/25/2012    10/2012 CT chest >> small to moderate R lung effusion>> chylothorax, 100% lymphs 10/2013 thoracentesis> cytology negative, WBC 1471, > 90% "small lymphs" 01/2014 CT chest> near complete resolution of pleural effusion, stable lymphadenopathy 08/2014 CT chest [place] ([facility]): small right sided effusion decreased in size, stable mediastinal lymphadenopathy 1.0cm largest, 23m  . 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 Acute respiratory failure ([place]) 11/21/2014  . Diabetic diarrhea ([place]) 10/03/2014  . DM type 2, uncontrolled, with renal complications ([place]) [id number]  . Cerebral infarct ([place]) 08/17/2013  . Arthritis 08/05/2013  . 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 Hyperlipidemia LDL goal <100 02/23/2014  .  COPD (chronic obstructive pulmonary disease) ([place])    Review of Systems  Constitutional: Negative for fever, chills and fatigue.  HENT: Negative for congestion, postnasal drip and rhinorrhea.   Respiratory: Positive for shortness of breath and wheezing. Negative for cough.   Cardiovascular: Negative for chest pain, palpitations and leg swelling.       Objective:   Physical Exam  Filed Vitals:   07/16/15 1616  BP: 132/72  Pulse: 57  Height: 5\' 3"  (1.6 m)  Weight: 194 lb (87.998 kg)  SpO2: 100%  RA  Gen: well appearing, no acute distress HEENT: NCAT, EOMi, OP clear, PULM: CTA B CV: RRR, no mgr, no JVD AB: BS+,  soft, nontender, no hsm Ext: warm, no edema, no clubbing, no cyanosis  January 2014 CXR [facility] reviewed by me with vascular congestion and cardiomegally, no mass.  Incidental finding on contrasted chest CT done to rule out pulmonary embolus, [facility] CT chest May 06 2012  RLL as dz RUL ll mm nodule Mulitple LN's some partially calcified 10/22/2012 CT chest >> 11 mm right middle lobe nodule unchanged, borderline enlarged mediastinal lymphadenopathy unchanged, there is a right pleural effusion which is new, there is atelectasis in the right base 03/2013 CT chest > slightly larger R pleural effusion, new trace left pleural effusion, RUL nodule unchanged 12x52mm; RLL atelectasis; calcified lymphadenopathy 05/2012 PFT> poor quality, ratio 78%, FEV1 1.02 (55%pred), FVC 1.3 (48% pred) 01/2014 CT chest > near complete resolution of the pleural effusion, stable lymphadenopathy May 2016 stool microbiology results reviewed, cultures negative, C. difficile report unavailable September 2016 CT chest demonstrates small pleural effusion on the right side, interval growth of one right lower lobe nodule which is now 5 mm in size, stable 1.3 cm right upper lobe nodule, some atelectasis and volume loss in the right lower lobe, mediastinal adenopathy unchanged September 2016 ultrasound chest showed an adequate size of effusion for thoracentesis ER records from September 2016 reviewed her she was seen for shortness of breath, had a CT scan of the chest as detailed above.  March 2017 CT chest (images personally reviewed, I agree with below): IMPRESSION: 1. There is small right pleural effusion decreased in size from prior exam. Small atelectasis right lower lobe posteriorly. 2. Stable partially calcified mediastinal and hilar lymph nodes. Stable upper abdominal adenopathy probable due to sarcoidosis. 3. No segmental infiltrate or pulmonary edema. 4. Again noted pulmonary nodules as described above. The largest nodule in  right upper lobe suprahilar region measures 1.2 cm. This is stable in size in appearance from prior exam 01/24/2014 most likely benign in nature. No new pulmonary nodules are noted. 5. Atherosclerotic calcifications of thoracic aorta and coronary arteries.     Assessment & Plan:   Cardiomyopathy, ischemic She has ischemic cardiomyopathy which was initially treated in [place]. I see from my review of the hospital records that she is going to follow-up with Dr. Rockey Situ. I completely agree with this.  Pulmonary nodule seen on imaging study The CT scan performed on March 1 showed that this nodule has been stable since 2015. No further imaging needed.  Pleural effusion This recurred when she was hospitalized for pneumonia in February 2016 and was once again noted to be chylous. As I noted multiple times in previous notes I'm not really sure why she has this but I believe that is related to her diagnosis of sarcoidosis and previous mediastinal surgery. At this time I don't think there is a role for any sort of procedure  like a pleurodesis as she's only needed to have therapeutic thoracenteses done about once every 2 years.  Pulmonary sarcoidosis ([place]) Diagnosed with a mediastinal lymph node biopsy over 20 years ago in [place]. Findings on CT chest are consistent with this but there is been no evidence of significant progression in the time that I've known her.  OSA on CPAP [place] has obstructive sleep apnea and remains compliant with her CPAP machine. We will reorder supplies for it today. I will have her follow-up with a sleep specialist next time, I recommended Dr. Ashby Dawes in [place]. She specifically requests to be transferred to the [place] office for her convenience as well.    Updated Medication List Outpatient Encounter Prescriptions as of 07/16/2015  Medication Sig  . acetaminophen (TYLENOL) 325 MG tablet Take 2 tablets (650 mg total) by mouth every 6 (six) hours as needed  for mild pain (headache).  [name] albuterol (PROVENTIL) (2.5 MG/3ML) 0.083% nebulizer solution Take 3 mLs (2.5 mg total) by nebulization every 6 (six) hours as needed for wheezing or shortness of breath.  . ALPRAZolam (XANAX) 0.5 MG tablet Take 1 tablet (0.5 mg total) by mouth daily as needed for sleep or anxiety.  [name] aspirin 81 MG EC tablet Take 1 tablet (81 mg total) by mouth daily. Swallow whole.  . benzonatate (TESSALON) 100 MG capsule TAKE TWO CAPSULES BY MOUTH THREE TIMES DAILY AS NEEDED FOR COUGH  . BRILINTA 90 MG TABS tablet Take 1 tablet (90 mg total) by mouth 2 (two) times daily.  . budesonide-formoterol (SYMBICORT) 160-4.5 MCG/ACT inhaler Inhale 2 puffs into the lungs 2 (two) times daily.  . COMBIVENT RESPIMAT 20-100 MCG/ACT AERS respimat Inhale 1 puff into the lungs 3 (three) times daily as needed.  . ergocalciferol (DRISDOL) 50000 units capsule Take 1 capsule (50,000 Units total) by mouth once a week. With Sunday dinner  . fluticasone (FLONASE) 50 MCG/ACT nasal spray Place 2 sprays into both nostrils daily.  . furosemide (LASIX) 20 MG tablet Take 1 tablet (20 mg total) by mouth daily.  . insulin aspart protamine - aspart (NOVOLOG 70/30 MIX) (70-30) 100 UNIT/ML FlexPen 60 units before breakfast,  60 units before dinner.  NEEDS 15 PENS PER MONTH  . isosorbide mononitrate (IMDUR) 30 MG 24 hr tablet TAKE 1 TABLET DAILY  . losartan (COZAAR) 100 MG tablet Take 1 tablet (100 mg total) by mouth daily.  [name] LYRICA 100 MG capsule TAKE ONE CAPSULE BY MOUTH THREE TIMES DAILY  . metoprolol tartrate (LOPRESSOR) 12.5 mg TABS tablet Take 12.5 mg by mouth 2 (two) times daily. Hold for SBP <110 and HR <60/min  . nitroGLYCERIN (NITROSTAT) 0.4 MG SL tablet Place 1 tablet (0.4 mg total) under the tongue every 5 (five) minutes as needed for chest pain.  [name] PARoxetine (PAXIL) 10 MG tablet Take 1 tablet (10 mg total) by mouth daily.  . rosuvastatin (CRESTOR) 20 MG tablet Take 1 tablet (20 mg total) by mouth daily.  (Patient taking differently: Take 20 mg by mouth at bedtime. )  . Spacer/Aero-Holding Chambers (AEROCHAMBER MV) inhaler Use as instructed  . traMADol (ULTRAM) 50 MG tablet Take 1 tablet (50 mg total) by mouth every 6 (six) hours as needed for moderate pain.   Facility-Administered Encounter Medications as of 07/16/2015  Medication  . albuterol (PROVENTIL) (5 MG/ML) 0.5% nebulizer solution 2.5 mg

## 2015-07-16 NOTE — Assessment & Plan Note (Signed)
Diagnosed with a mediastinal lymph node biopsy over 20 years ago in [place]. Findings on CT chest are consistent with this but there is been no evidence of significant progression in the time that I've known her.

## 2015-07-18 ENCOUNTER — Other Ambulatory Visit: Payer: Self-pay | Admitting: *Deleted

## 2015-07-18 DIAGNOSIS — I509 Heart failure, unspecified: Secondary | ICD-10-CM | POA: Diagnosis not present

## 2015-07-18 DIAGNOSIS — J440 Chronic obstructive pulmonary disease with acute lower respiratory infection: Secondary | ICD-10-CM | POA: Diagnosis not present

## 2015-07-18 DIAGNOSIS — J209 Acute bronchitis, unspecified: Secondary | ICD-10-CM | POA: Diagnosis not present

## 2015-07-18 DIAGNOSIS — I130 Hypertensive heart and chronic kidney disease with heart failure and stage 1 through stage 4 chronic kidney disease, or unspecified chronic kidney disease: Secondary | ICD-10-CM | POA: Diagnosis not present

## 2015-07-18 DIAGNOSIS — J189 Pneumonia, unspecified organism: Secondary | ICD-10-CM | POA: Diagnosis not present

## 2015-07-18 DIAGNOSIS — E1122 Type 2 diabetes mellitus with diabetic chronic kidney disease: Secondary | ICD-10-CM | POA: Diagnosis not present

## 2015-07-18 NOTE — Patient Outreach (Signed)
Received a call from pt reporting she has  Been sick since yesterday, throwing up phlegm, no appetite, shaky, cannot keep anything down on her stomach.  Pt reports she is not sob.  Pt reports she is concerned - recent hospitalization for pneumonia.  Pt reports no fever, just nervous.   RN CM advised pt to call MD (Dr. Derrel Nip), report symptoms  to which pt said she would.  RN CM also requested a call back from pt.    Pamela Collier.   [place] [facility]  [phone]

## 2015-07-18 NOTE — Patient Outreach (Signed)
Called pt to check on status, f/u on previous call with pt earlier today.   Person answering the phone reports pt does not want to talk now, lying down, not feeling well.    Note- previous phone call with pt today, RN CM advised pt to f/u with MD.       Zara Chess.   [place] [facility]  [phone]

## 2015-07-20 ENCOUNTER — Telehealth: Payer: Self-pay | Admitting: Internal Medicine

## 2015-07-20 NOTE — Telephone Encounter (Signed)
IN red folder 

## 2015-07-20 NOTE — Telephone Encounter (Signed)
Pt dropped off form to be filled out from last hospital stay. Pt also left a copy of her blood pressure readings for February and March. Both paper are in Dr. Lupita Dawn box/msn

## 2015-07-22 DIAGNOSIS — Z7689 Persons encountering health services in other specified circumstances: Secondary | ICD-10-CM

## 2015-07-23 ENCOUNTER — Other Ambulatory Visit: Payer: Self-pay | Admitting: *Deleted

## 2015-07-23 NOTE — Patient Outreach (Signed)
Transition of care call (discharged 2/17).   Pt reports doing better this week, had questionable flu.  Pt reports she got her appetite back, did not call MD but instead rested.   Pt reports sugar today was 142, weights been staying 191 lbs past few days, was 185 lbs when not eating (stomach illness).  Pt reports BP okay.   As discussed with pt, plan to f/u again telephonically 3/20 - ongoing transition of care.    Zara Chess.   [place] [facility]  [phone]

## 2015-07-23 NOTE — Telephone Encounter (Signed)
Form completed and charges submitted. Notified patient ready for pickup and placed at front desk.

## 2015-07-23 NOTE — Telephone Encounter (Signed)
Returned to you with comments   Charge #50 for form

## 2015-07-24 DIAGNOSIS — I509 Heart failure, unspecified: Secondary | ICD-10-CM | POA: Diagnosis not present

## 2015-07-24 DIAGNOSIS — I130 Hypertensive heart and chronic kidney disease with heart failure and stage 1 through stage 4 chronic kidney disease, or unspecified chronic kidney disease: Secondary | ICD-10-CM | POA: Diagnosis not present

## 2015-07-24 DIAGNOSIS — J209 Acute bronchitis, unspecified: Secondary | ICD-10-CM | POA: Diagnosis not present

## 2015-07-24 DIAGNOSIS — J189 Pneumonia, unspecified organism: Secondary | ICD-10-CM | POA: Diagnosis not present

## 2015-07-24 DIAGNOSIS — E1122 Type 2 diabetes mellitus with diabetic chronic kidney disease: Secondary | ICD-10-CM | POA: Diagnosis not present

## 2015-07-24 DIAGNOSIS — J440 Chronic obstructive pulmonary disease with acute lower respiratory infection: Secondary | ICD-10-CM | POA: Diagnosis not present

## 2015-07-26 ENCOUNTER — Telehealth: Payer: Self-pay | Admitting: Internal Medicine

## 2015-07-26 DIAGNOSIS — E1165 Type 2 diabetes mellitus with hyperglycemia: Principal | ICD-10-CM

## 2015-07-26 DIAGNOSIS — E1121 Type 2 diabetes mellitus with diabetic nephropathy: Secondary | ICD-10-CM

## 2015-07-26 DIAGNOSIS — IMO0002 Reserved for concepts with insufficient information to code with codable children: Secondary | ICD-10-CM

## 2015-07-26 NOTE — Telephone Encounter (Signed)
Blood sugar log from Feb/march shows fastings are > 125 and < 200.  Pre dinner sugars are 102 to 200.[name] According to last office visit her Insulin regimen is 44 untis of 70/30 in the am and 60 units in the PM.  I would like her to   Will increase the evening dose to 63 units and the morning dose to 43 units

## 2015-07-26 NOTE — Telephone Encounter (Signed)
Patient was unavailable when called.  Spoke with husband, Peterman. He verbalized understanding of new doses and will pass the information to [place].  Encouraged to call back with any questions or concerns.

## 2015-07-26 NOTE — Assessment & Plan Note (Signed)
Blood sugar log from Feb/march shows fastings are > 125 and < 200.  Pre dinner sugars are 102 to 200.[name] Insulin regimen is 44 untis of 70/30 in the am and 60 units in the PM.  Will increase the evening dose to 63 units and the morning dose to 43 units

## 2015-07-30 ENCOUNTER — Encounter: Payer: Self-pay | Admitting: *Deleted

## 2015-07-30 ENCOUNTER — Other Ambulatory Visit: Payer: Self-pay | Admitting: *Deleted

## 2015-07-30 NOTE — Patient Outreach (Signed)
Final transition of care call (discharged 2/17).  Pt reports woke up this am with wheezing, did a treatment, feels much better, might be coming from the weather.  Pt reports appetite and sugars are good, weight today 190 lbs.  Discussed with pt continuing to follow with community nurse case management services to which pt declined.  As discussed, pt has RN CM's contact number to call if needs arise in the future.   Plan to close case, transition of care program completed, goals met.  Plan to send case closure letter to Dr. Derrel Nip by in basket in [place]. Plan to inform [facility] care management assistant to close case.   Zara Chess.   [place] [facility]  [phone]

## 2015-07-31 ENCOUNTER — Telehealth: Payer: Self-pay | Admitting: Pulmonary Disease

## 2015-07-31 NOTE — Telephone Encounter (Signed)
Called and spoke to pt. Pt states she spoke to [facility] and was advised that she would receive a call back but never did. Called [facility] and spoke to rep, she stated they attempted to contact pt on 3/10 but was unable to leave a VM and they did not call back. Rep states she will have an RT contact pt today regarding her CPAP set up. Called and informed pt that [facility] rep will contact her today about her CPAP. Pt verbalized understanding and denied any further questions or concerns at this time.

## 2015-08-01 ENCOUNTER — Telehealth: Payer: Self-pay

## 2015-08-01 DIAGNOSIS — I509 Heart failure, unspecified: Secondary | ICD-10-CM | POA: Diagnosis not present

## 2015-08-01 DIAGNOSIS — J189 Pneumonia, unspecified organism: Secondary | ICD-10-CM | POA: Diagnosis not present

## 2015-08-01 DIAGNOSIS — I130 Hypertensive heart and chronic kidney disease with heart failure and stage 1 through stage 4 chronic kidney disease, or unspecified chronic kidney disease: Secondary | ICD-10-CM | POA: Diagnosis not present

## 2015-08-01 DIAGNOSIS — E1122 Type 2 diabetes mellitus with diabetic chronic kidney disease: Secondary | ICD-10-CM | POA: Diagnosis not present

## 2015-08-01 DIAGNOSIS — J209 Acute bronchitis, unspecified: Secondary | ICD-10-CM | POA: Diagnosis not present

## 2015-08-01 DIAGNOSIS — J440 Chronic obstructive pulmonary disease with acute lower respiratory infection: Secondary | ICD-10-CM | POA: Diagnosis not present

## 2015-08-01 NOTE — Telephone Encounter (Signed)
Ok to give order? 

## 2015-08-01 NOTE — Telephone Encounter (Signed)
Merry Proud from [place] is requesting a verbal order for two additional visits with Pamela Collier, pt is doing well but missed two appointments due to being sick. Please advise, thanks

## 2015-08-01 NOTE — Telephone Encounter (Signed)
Verbal order given  

## 2015-08-02 DIAGNOSIS — H43313 Vitreous membranes and strands, bilateral: Secondary | ICD-10-CM | POA: Diagnosis not present

## 2015-08-02 DIAGNOSIS — E119 Type 2 diabetes mellitus without complications: Secondary | ICD-10-CM | POA: Diagnosis not present

## 2015-08-02 LAB — HM DIABETES EYE EXAM

## 2015-08-03 DIAGNOSIS — J440 Chronic obstructive pulmonary disease with acute lower respiratory infection: Secondary | ICD-10-CM | POA: Diagnosis not present

## 2015-08-03 DIAGNOSIS — J209 Acute bronchitis, unspecified: Secondary | ICD-10-CM | POA: Diagnosis not present

## 2015-08-03 DIAGNOSIS — J189 Pneumonia, unspecified organism: Secondary | ICD-10-CM | POA: Diagnosis not present

## 2015-08-03 DIAGNOSIS — I130 Hypertensive heart and chronic kidney disease with heart failure and stage 1 through stage 4 chronic kidney disease, or unspecified chronic kidney disease: Secondary | ICD-10-CM | POA: Diagnosis not present

## 2015-08-03 DIAGNOSIS — E1122 Type 2 diabetes mellitus with diabetic chronic kidney disease: Secondary | ICD-10-CM | POA: Diagnosis not present

## 2015-08-03 DIAGNOSIS — I509 Heart failure, unspecified: Secondary | ICD-10-CM | POA: Diagnosis not present

## 2015-08-06 ENCOUNTER — Telehealth: Payer: Self-pay | Admitting: Internal Medicine

## 2015-08-06 DIAGNOSIS — I509 Heart failure, unspecified: Secondary | ICD-10-CM | POA: Diagnosis not present

## 2015-08-06 DIAGNOSIS — J209 Acute bronchitis, unspecified: Secondary | ICD-10-CM | POA: Diagnosis not present

## 2015-08-06 DIAGNOSIS — I130 Hypertensive heart and chronic kidney disease with heart failure and stage 1 through stage 4 chronic kidney disease, or unspecified chronic kidney disease: Secondary | ICD-10-CM | POA: Diagnosis not present

## 2015-08-06 DIAGNOSIS — J189 Pneumonia, unspecified organism: Secondary | ICD-10-CM | POA: Diagnosis not present

## 2015-08-06 DIAGNOSIS — E1122 Type 2 diabetes mellitus with diabetic chronic kidney disease: Secondary | ICD-10-CM | POA: Diagnosis not present

## 2015-08-06 DIAGNOSIS — J440 Chronic obstructive pulmonary disease with acute lower respiratory infection: Secondary | ICD-10-CM | POA: Diagnosis not present

## 2015-08-06 NOTE — Telephone Encounter (Signed)
Insurance requesting PA for Novolog 70/30, PA sent but insurance wants documented failure of Humalog or trial of Humalog. I don't see in chart where Humalog has been tried.

## 2015-08-06 NOTE — Telephone Encounter (Signed)
Yes area to document reason Humalog not tried document and signed by md faxed back for insurance review.

## 2015-08-06 NOTE — Telephone Encounter (Signed)
She has not tried Humalog ,  This will require 3  shots a day of short acting insulin at each meal.  Plus one dose daily of either  lantus or levemir,  OR 2  Shots of NPH.  Is there a way to convey this? If not we will have to bring the patient in for teaching

## 2015-08-07 ENCOUNTER — Ambulatory Visit: Payer: Medicare Other | Admitting: Pulmonary Disease

## 2015-08-07 ENCOUNTER — Encounter: Payer: Self-pay | Admitting: Internal Medicine

## 2015-08-10 DIAGNOSIS — J209 Acute bronchitis, unspecified: Secondary | ICD-10-CM | POA: Diagnosis not present

## 2015-08-10 DIAGNOSIS — I130 Hypertensive heart and chronic kidney disease with heart failure and stage 1 through stage 4 chronic kidney disease, or unspecified chronic kidney disease: Secondary | ICD-10-CM | POA: Diagnosis not present

## 2015-08-10 DIAGNOSIS — J440 Chronic obstructive pulmonary disease with acute lower respiratory infection: Secondary | ICD-10-CM | POA: Diagnosis not present

## 2015-08-10 DIAGNOSIS — I509 Heart failure, unspecified: Secondary | ICD-10-CM | POA: Diagnosis not present

## 2015-08-10 DIAGNOSIS — E1122 Type 2 diabetes mellitus with diabetic chronic kidney disease: Secondary | ICD-10-CM | POA: Diagnosis not present

## 2015-08-10 DIAGNOSIS — J189 Pneumonia, unspecified organism: Secondary | ICD-10-CM | POA: Diagnosis not present

## 2015-08-13 ENCOUNTER — Other Ambulatory Visit: Payer: Self-pay | Admitting: Internal Medicine

## 2015-08-15 DIAGNOSIS — E1122 Type 2 diabetes mellitus with diabetic chronic kidney disease: Secondary | ICD-10-CM | POA: Diagnosis not present

## 2015-08-15 DIAGNOSIS — J189 Pneumonia, unspecified organism: Secondary | ICD-10-CM | POA: Diagnosis not present

## 2015-08-15 DIAGNOSIS — J440 Chronic obstructive pulmonary disease with acute lower respiratory infection: Secondary | ICD-10-CM | POA: Diagnosis not present

## 2015-08-15 DIAGNOSIS — I130 Hypertensive heart and chronic kidney disease with heart failure and stage 1 through stage 4 chronic kidney disease, or unspecified chronic kidney disease: Secondary | ICD-10-CM | POA: Diagnosis not present

## 2015-08-15 DIAGNOSIS — I509 Heart failure, unspecified: Secondary | ICD-10-CM | POA: Diagnosis not present

## 2015-08-15 DIAGNOSIS — J209 Acute bronchitis, unspecified: Secondary | ICD-10-CM | POA: Diagnosis not present

## 2015-08-17 DIAGNOSIS — I509 Heart failure, unspecified: Secondary | ICD-10-CM | POA: Diagnosis not present

## 2015-08-17 DIAGNOSIS — I130 Hypertensive heart and chronic kidney disease with heart failure and stage 1 through stage 4 chronic kidney disease, or unspecified chronic kidney disease: Secondary | ICD-10-CM | POA: Diagnosis not present

## 2015-08-17 DIAGNOSIS — E1122 Type 2 diabetes mellitus with diabetic chronic kidney disease: Secondary | ICD-10-CM | POA: Diagnosis not present

## 2015-08-17 DIAGNOSIS — J189 Pneumonia, unspecified organism: Secondary | ICD-10-CM | POA: Diagnosis not present

## 2015-08-17 DIAGNOSIS — J440 Chronic obstructive pulmonary disease with acute lower respiratory infection: Secondary | ICD-10-CM | POA: Diagnosis not present

## 2015-08-17 DIAGNOSIS — J209 Acute bronchitis, unspecified: Secondary | ICD-10-CM | POA: Diagnosis not present

## 2015-08-24 DIAGNOSIS — J189 Pneumonia, unspecified organism: Secondary | ICD-10-CM | POA: Diagnosis not present

## 2015-08-24 DIAGNOSIS — I509 Heart failure, unspecified: Secondary | ICD-10-CM | POA: Diagnosis not present

## 2015-08-24 DIAGNOSIS — J440 Chronic obstructive pulmonary disease with acute lower respiratory infection: Secondary | ICD-10-CM | POA: Diagnosis not present

## 2015-08-24 DIAGNOSIS — J209 Acute bronchitis, unspecified: Secondary | ICD-10-CM | POA: Diagnosis not present

## 2015-08-24 DIAGNOSIS — E1122 Type 2 diabetes mellitus with diabetic chronic kidney disease: Secondary | ICD-10-CM | POA: Diagnosis not present

## 2015-08-24 DIAGNOSIS — I130 Hypertensive heart and chronic kidney disease with heart failure and stage 1 through stage 4 chronic kidney disease, or unspecified chronic kidney disease: Secondary | ICD-10-CM | POA: Diagnosis not present

## 2015-08-27 ENCOUNTER — Ambulatory Visit: Payer: Medicare Other | Admitting: Pulmonary Disease

## 2015-09-04 ENCOUNTER — Institutional Professional Consult (permissible substitution): Payer: Medicare Other | Admitting: Internal Medicine

## 2015-09-06 ENCOUNTER — Other Ambulatory Visit: Payer: Self-pay

## 2015-09-06 MED ORDER — ALENDRONATE SODIUM 70 MG PO TABS: 70.0000 mg | ORAL_TABLET | ORAL | Status: DC

## 2015-09-11 ENCOUNTER — Telehealth: Payer: Self-pay | Admitting: Internal Medicine

## 2015-09-11 NOTE — Telephone Encounter (Signed)
Pt states she traveled to [place] and returned yesterday, and ever since has been wheezing and breathing heavy. States her weight this morning was 198. She usually weighs 191

## 2015-09-11 NOTE — Telephone Encounter (Signed)
Pt states she is wheezing and SOB and that she didn't take her fluid pill regularly since she was out of town. States she did take it this am. States she also has a cough. Weight has not changed since taking the fluid pill this am. Pt does have a H/O CHF. Please advise.

## 2015-09-11 NOTE — Telephone Encounter (Signed)
Take a double dose of the lasix in the morning, call back the following day if not improving.

## 2015-09-12 ENCOUNTER — Encounter: Payer: Self-pay | Admitting: Emergency Medicine

## 2015-09-12 ENCOUNTER — Emergency Department
Admission: EM | Admit: 2015-09-12 | Discharge: 2015-09-12 | Disposition: A | Discharge status: Home / Self Care | Payer: Medicare Other | Attending: Emergency Medicine | Admitting: Emergency Medicine

## 2015-09-12 ENCOUNTER — Ambulatory Visit (INDEPENDENT_AMBULATORY_CARE_PROVIDER_SITE_OTHER): Payer: Medicare Other | Admitting: Family Medicine

## 2015-09-12 ENCOUNTER — Emergency Department: Payer: Medicare Other

## 2015-09-12 ENCOUNTER — Encounter: Payer: Self-pay | Admitting: Family Medicine

## 2015-09-12 VITALS — BP 140/60 | HR 76 | Temp 98.5°F | Wt 193.0 lb

## 2015-09-12 DIAGNOSIS — R0602 Shortness of breath: Secondary | ICD-10-CM | POA: Diagnosis not present

## 2015-09-12 DIAGNOSIS — Z79899 Other long term (current) drug therapy: Secondary | ICD-10-CM | POA: Insufficient documentation

## 2015-09-12 DIAGNOSIS — Z7982 Long term (current) use of aspirin: Secondary | ICD-10-CM | POA: Insufficient documentation

## 2015-09-12 DIAGNOSIS — M6281 Muscle weakness (generalized): Secondary | ICD-10-CM | POA: Diagnosis not present

## 2015-09-12 DIAGNOSIS — R531 Weakness: Secondary | ICD-10-CM | POA: Diagnosis not present

## 2015-09-12 DIAGNOSIS — M810 Age-related osteoporosis without current pathological fracture: Secondary | ICD-10-CM | POA: Diagnosis not present

## 2015-09-12 DIAGNOSIS — Z794 Long term (current) use of insulin: Secondary | ICD-10-CM | POA: Insufficient documentation

## 2015-09-12 DIAGNOSIS — J449 Chronic obstructive pulmonary disease, unspecified: Secondary | ICD-10-CM | POA: Diagnosis not present

## 2015-09-12 DIAGNOSIS — I2510 Atherosclerotic heart disease of native coronary artery without angina pectoris: Secondary | ICD-10-CM | POA: Diagnosis not present

## 2015-09-12 DIAGNOSIS — F418 Other specified anxiety disorders: Secondary | ICD-10-CM | POA: Diagnosis not present

## 2015-09-12 DIAGNOSIS — E1351 Other specified diabetes mellitus with diabetic peripheral angiopathy without gangrene: Secondary | ICD-10-CM | POA: Diagnosis not present

## 2015-09-12 DIAGNOSIS — I110 Hypertensive heart disease with heart failure: Secondary | ICD-10-CM | POA: Diagnosis not present

## 2015-09-12 DIAGNOSIS — I5032 Chronic diastolic (congestive) heart failure: Secondary | ICD-10-CM | POA: Diagnosis not present

## 2015-09-12 DIAGNOSIS — Z8673 Personal history of transient ischemic attack (TIA), and cerebral infarction without residual deficits: Secondary | ICD-10-CM | POA: Insufficient documentation

## 2015-09-12 DIAGNOSIS — I255 Ischemic cardiomyopathy: Secondary | ICD-10-CM

## 2015-09-12 DIAGNOSIS — E785 Hyperlipidemia, unspecified: Secondary | ICD-10-CM | POA: Diagnosis not present

## 2015-09-12 DIAGNOSIS — R079 Chest pain, unspecified: Secondary | ICD-10-CM | POA: Diagnosis not present

## 2015-09-12 DIAGNOSIS — R202 Paresthesia of skin: Secondary | ICD-10-CM | POA: Diagnosis not present

## 2015-09-12 DIAGNOSIS — R0600 Dyspnea, unspecified: Secondary | ICD-10-CM

## 2015-09-12 DIAGNOSIS — R209 Unspecified disturbances of skin sensation: Secondary | ICD-10-CM | POA: Diagnosis not present

## 2015-09-12 LAB — BASIC METABOLIC PANEL
Anion gap: 10 (ref 5–15)
BUN: 26 mg/dL — ABNORMAL HIGH (ref 6–20)
CHLORIDE: 101 mmol/L (ref 101–111)
CO2: 27 mmol/L (ref 22–32)
CREATININE: 1.55 mg/dL — AB (ref 0.44–1.00)
Calcium: 10 mg/dL (ref 8.9–10.3)
GFR, EST AFRICAN AMERICAN: 37 mL/min — AB (ref >60)
GFR, EST NON AFRICAN AMERICAN: 32 mL/min — AB (ref >60)
Glucose, Bld: 200 mg/dL — ABNORMAL HIGH (ref 65–99)
Potassium: 3.6 mmol/L (ref 3.5–5.1)
SODIUM: 138 mmol/L (ref 135–145)

## 2015-09-12 LAB — URINALYSIS COMPLETE WITH MICROSCOPIC (ARMC ONLY)
BILIRUBIN URINE: NEGATIVE
GLUCOSE, UA: NEGATIVE mg/dL
KETONES UR: NEGATIVE mg/dL
NITRITE: NEGATIVE
Protein, ur: 100 mg/dL — AB
SPECIFIC GRAVITY, URINE: 1.012 (ref 1.005–1.030)
pH: 5 (ref 5.0–8.0)

## 2015-09-12 LAB — CBC
HCT: 34.2 % — ABNORMAL LOW (ref 35.0–47.0)
Hemoglobin: 11.7 g/dL — ABNORMAL LOW (ref 12.0–16.0)
MCH: 30.4 pg (ref 26.0–34.0)
MCHC: 34.2 g/dL (ref 32.0–36.0)
MCV: 88.7 fL (ref 80.0–100.0)
PLATELETS: 281 10*3/uL (ref 150–440)
RBC: 3.85 MIL/uL (ref 3.80–5.20)
RDW: 15 % — ABNORMAL HIGH (ref 11.5–14.5)
WBC: 7.3 10*3/uL (ref 3.6–11.0)

## 2015-09-12 LAB — TROPONIN I: TROPONIN I: 0.03 ng/mL (ref <0.031)

## 2015-09-12 LAB — GLUCOSE, CAPILLARY: Glucose-Capillary: 188 mg/dL — ABNORMAL HIGH (ref 65–99)

## 2015-09-12 MED ORDER — SODIUM CHLORIDE 0.9 % IV BOLUS (SEPSIS)
500.0000 mL | Freq: Once | INTRAVENOUS | Status: AC
  Administered 2015-09-12: 500 mL via INTRAVENOUS

## 2015-09-12 MED ORDER — FOSFOMYCIN TROMETHAMINE 3 G PO PACK
3.0000 g | PACK | ORAL | Status: AC
  Administered 2015-09-12: 3 g via ORAL
  Filled 2015-09-12: qty 3

## 2015-09-12 NOTE — ED Notes (Signed)
Patient transported to X-ray 

## 2015-09-12 NOTE — Discharge Instructions (Signed)
°  Call your regular doctor to schedule the next available appointment to follow up on todays ED visit, or return immediately to the ED you develop new pain (especially any chest pain), shortness of breath, you have decreased urine production, develop fever, persistent vomiting, or other symptoms that concern you.   Weakness Weakness is a lack of strength. It may be felt all over the body (generalized) or in one specific part of the body (focal). Some causes of weakness can be serious. You may need further medical evaluation, especially if you are elderly or you have a history of immunosuppression (such as chemotherapy or HIV), kidney disease, heart disease, or diabetes. CAUSES  Weakness can be caused by many different things, including:  Infection.  Physical exhaustion.  Internal bleeding or other blood loss that results in a lack of red blood cells (anemia).  Dehydration. This cause is more common in elderly people.  Side effects or electrolyte abnormalities from medicines, such as pain medicines or sedatives.  Emotional distress, anxiety, or depression.  Circulation problems, especially severe peripheral arterial disease.  Heart disease, such as rapid atrial fibrillation, bradycardia, or heart failure.  Nervous system disorders, such as Guillain-Barr syndrome, multiple sclerosis, or stroke. DIAGNOSIS  To find the cause of your weakness, your caregiver will take your history and perform a physical exam. Lab tests or X-rays may also be ordered, if needed. TREATMENT  Treatment of weakness depends on the cause of your symptoms and can vary greatly. HOME CARE INSTRUCTIONS   Rest as needed.  Eat a well-balanced diet.  Try to get some exercise every day.  Only take over-the-counter or prescription medicines as directed by your caregiver. SEEK MEDICAL CARE IF:   Your weakness seems to be getting worse or spreads to other parts of your body.  You develop new aches or pains. SEEK  IMMEDIATE MEDICAL CARE IF:   You cannot perform your normal daily activities, such as getting dressed and feeding yourself.  You cannot walk up and down stairs, or you feel exhausted when you do so.  You have shortness of breath or chest pain.  You have difficulty moving parts of your body.  You have weakness in only one area of the body or on only one side of the body.  You have a fever.  You have trouble speaking or swallowing.  You cannot control your bladder or bowel movements.  You have black or bloody vomit or stools. MAKE SURE YOU:  Understand these instructions.  Will watch your condition.  Will get help right away if you are not doing well or get worse.   This information is not intended to replace advice given to you by your health care provider. Make sure you discuss any questions you have with your health care provider.   Document Released: 04/28/2005 Document Revised: 10/28/2011 Document Reviewed: 06/27/2011 Elsevier Interactive Patient Education [employer].

## 2015-09-12 NOTE — Telephone Encounter (Signed)
Pt informed. Nothing further needed. 

## 2015-09-12 NOTE — ED Notes (Signed)
IV infiltrated, warm compress placed over infiltration site. New IV access obtained.

## 2015-09-12 NOTE — Progress Notes (Signed)
Patient ID: Pamela Collier, female   DOB: 1941/01/01, 75 y.o.   MRN: [id number]  Tommi Rumps, MD Phone: [phone]  Pamela Collier is a 75 y.o. female who presents today for same-day visit.  Patient notes she has gained 7 pounds in the last week. She notes shortness of breath particularly with exertion. She had an episode of light chest pressure this morning where she got a little bit sweaty. She also notes her whole body went numb as well with this and had headache that has resolved. She gets dizzy when she stands up. She's been coughing some and had wheezing yesterday. Cough is nonproductive. She notes her whole body aches at this time. States now her legs are still numb her hands are improved. Family member who presents with her says she typically does not appear short of breath when speaking. This is a significant change from prior.  PMH: nonsmoker.   ROS see history of present illness  Objective  Physical Exam Filed Vitals:   09/12/15 1002  BP: 140/60  Pulse: 76  Temp: 98.5 F (36.9 C)    BP Readings from Last 3 Encounters:  09/12/15 140/60  07/16/15 132/72  07/16/15 118/62   Wt Readings from Last 3 Encounters:  09/12/15 193 lb (87.544 kg)  07/16/15 194 lb (87.998 kg)  07/16/15 192 lb (87.091 kg)   Laying blood pressure 114/56 pulse 73 Sitting blood pressure 104/52 pulse 73 Standing blood pressure 92/48 pulse 75  Physical Exam  HENT:  Head: Normocephalic and atraumatic.  Right Ear: External ear normal.  Left Ear: External ear normal.  Mouth/Throat: Oropharynx is clear and moist. No oropharyngeal exudate.  Eyes: Conjunctivae are normal. Pupils are equal, round, and reactive to light.  Cardiovascular: Normal rate, regular rhythm and normal heart sounds.   Pulmonary/Chest:  Does appear short of breath when speaking in full sentences, no wheezes or rhonchi, good air movement  Neurological: She is alert.  CN 2-12 intact, 5/5 strength in bilateral biceps,  triceps, grip, quads, hamstrings, plantar and dorsiflexion, sensation to light touch intact in bilateral UE and LE, absent patellar reflexes  Skin: Skin is warm and dry. She is not diaphoretic.   EKG: Normal sinus rhythm, first-degree AV block, right bundle branch block, LVH  Assessment/Plan: Please see individual problem list.  Dyspnea Patient with recent onset shortness of breath accompanied by chest pain this morning and dizziness. She reports weight gain at home as well. She is visibly short of breath when speaking and with exertion. Her oxygen did not drop below 93% on ambulation. Another concerning aspect is that she reports whole-body numbness with this. She is orthostatic on exam as well. She is neurologically intact. Given her constellation of symptoms and that she is short of breath on exam with recent chest pain she warrants evaluation the emergency room for troponins, BNP, x-ray, and likely CT scan of her head. I discussed this with her and her son. EMS will transport the patient to the emergency room. CMA will call the charge nurse in the emergency room.    Orders Placed This Encounter  Procedures  . EKG 12-Lead    Tommi Rumps, MD [place]

## 2015-09-12 NOTE — Assessment & Plan Note (Addendum)
Patient with recent onset shortness of breath accompanied by chest pain this morning and dizziness. She reports weight gain at home as well. She is visibly short of breath when speaking and with exertion. Her oxygen did not drop below 93% on ambulation. Another concerning aspect is that she reports whole-body numbness with this. She is orthostatic on exam as well. She is neurologically intact. Given her constellation of symptoms and that she is short of breath on exam with recent chest pain she warrants evaluation the emergency room for troponins, BNP, x-ray, and likely CT scan of her head. I discussed this with her and her son. EMS will transport the patient to the emergency room. CMA will call the charge nurse in the emergency room.

## 2015-09-12 NOTE — ED Notes (Signed)
Patient transported to CT 

## 2015-09-12 NOTE — ED Provider Notes (Signed)
[facility] Emergency Department Provider Note  ____________________________________________  Time seen: Approximately 2:54 PM  I have reviewed the triage vital signs and the nursing notes.   HISTORY  Chief Complaint Chest Pain    HPI Pamela Collier is a 75 y.o. female presents for evaluation of a tingling feeling experience throughout her body today.  Patient reports that she recently lost a loved one in [place].  Since then she's been experiencing a feeling of weakness and some fatigue. She reports that she is able to get up and walk, she has not had any chest pain shortness of breath fevers or chills. However she does report a mild sensation of "stress" feeling across her chest for about the last 5 days. She reports she does generally tired. Throughout the day today she's felt as though she is tingly in both arms and both legs, it comes and goes and is better now. No weakness or numbness in any one side, seems to be on both hands and both feet.  No chest pain that radiates. No shortness of breath. No trouble walking, just feels a little lightheaded when she is up moving about.  No headache. She does notice the fatigue and weakness seems to be more prominent when standing. She thinks she may feel dehydrated and didn't drink enough last few days.  No leg swelling.  - Patient was evidently noted to be orthostatic at clinic. She also noted to the doctor that she was experiencing numbness in her body. -  She does have a notable history of coronary disease.    Past Medical History  Diagnosis Date  . Coronary artery disease, occlusive     a. Previous PCI to the LAD, LCx, and RCA in 2010, 2011, 2013, and 2016. All performed in [place].  . Cardiomyopathy, ischemic     a. EF 35 to 40% by echo in 2013 b. EF improved to 50-55% by echo in 04/2015.  [name] Hepatic steatosis     by CT abd pelvis  . Osteoporosis, post-menopausal   . 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 Atherosclerotic peripheral vascular disease with gangrene [facility]) august 2012  . Hypertensive heart disease   . Sarcoidosis ([place])   . Chronic diastolic CHF (congestive heart failure) (HCC)     a. EF 50-55% by echo in 04/2015  . Renal insufficiency   . Pleural effusion 10/25/2012    10/2012 CT chest >> small to moderate R lung effusion>> chylothorax, 100% lymphs 10/2013 thoracentesis> cytology negative, WBC 1471, > 90% "small lymphs" 01/2014 CT chest> near complete resolution of pleural effusion, stable lymphadenopathy 08/2014 CT chest [place] ([facility]): small right sided effusion decreased in size, stable mediastinal lymphadenopathy 1.0cm largest, 57m  . 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 Acute respiratory failure ([place]) 11/21/2014  . Diabetic diarrhea ([place]) 10/03/2014  . DM type 2, uncontrolled, with renal complications ([place]) [id number]  . Cerebral infarct ([place]) 08/17/2013  . Arthritis 08/05/2013  . 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 Hyperlipidemia LDL goal <100 02/23/2014  . COPD (chronic obstructive pulmonary disease) ([place])     Patient  Active Problem List   Diagnosis Date Noted  . Dyspnea 09/12/2015  . Hypertensive heart disease   . Coronary artery disease, occlusive   . OSA on CPAP 07/07/2015  . Hospital discharge follow-up 07/07/2015  . Chest pain with low risk of acute coronary syndrome 06/29/2015  . Community acquired pneumonia   . Elevated d-dimer   . Pleural effusion, right   . Coronary artery disease involving native coronary artery of native heart with angina pectoris ([place])   . Elevated troponin I level   . Pneumonia 06/26/2015  .  Diarrhea 05/15/2015  . Fall 04/19/2015  . Acute on chronic kidney failure ([place]) 04/19/2015  . Chronic diastolic CHF (congestive heart failure) ([place]) 04/18/2015  . Diabetic neuropathy ([place]) 02/21/2015  . Benign neoplasm of sigmoid colon   . UTI (urinary tract infection) 10/04/2014  . Diabetic diarrhea ([place]) 10/03/2014  . Low back pain with radiation 04/04/2014  . Essential hypertension 04/04/2014  . Hyperlipidemia LDL goal <100 02/23/2014  . Long-term use of high-risk medication 02/23/2014  . Dermatophytic onychia 08/17/2013  . Angina pectoris ([place]) 08/17/2013  . Arteriosclerosis of coronary artery 08/17/2013  . Cerebral infarct ([place]) 08/17/2013  . Bony exostosis 08/17/2013  . Arthritis 08/05/2013  . Unsteady gait 03/24/2013  . History of colonic polyps 02/20/2013  . 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 Dysphagia 12/07/2012  . Pleural effusion 10/25/2012  . Cough 10/25/2012  . Pulmonary nodule seen on imaging study 05/20/2012  . 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 Cardiomyopathy, ischemic   . Hepatic steatosis   . Osteoporosis, post-menopausal   . Peripheral vascular disease due to secondary diabetes mellitus ([place])   . DM (diabetes mellitus), type 2, uncontrolled, with renal complications ([place]) [id number]  . Double vessel coronary artery disease 12/14/2011    Past Surgical History  Procedure Laterality Date  . Abdominal hysterectomy      at ge 39. secondary to bleeding  . Toe amputation  Sept 2012    Right 2nd toe, [place]  . Ptca  August 2012    Right Posterior tibial artery , [place]  . Colonoscopy with propofol N/A 01/09/2015    Procedure: COLONOSCOPY WITH PROPOFOL;  Surgeon: Lucilla Lame, MD;  Location: [facility] ENDOSCOPY;  Service: Endoscopy;  Laterality: N/A;  . Cholecystectomy    . Carpal tunnel release Bilateral   . Breast cyst aspiration Right   . Coronary angioplasty with stent placement      [place];  [facility]    Current Outpatient Rx  Name  Route  Sig  Dispense  Refill  . acetaminophen (TYLENOL) 325 MG tablet   Oral   Take 2 tablets (650 mg total) by mouth every 6 (six) hours as needed for mild pain (headache).   20 tablet   0   . albuterol (PROVENTIL) (2.5 MG/3ML) 0.083% nebulizer solution   Nebulization   Take 3 mLs (2.5 mg total) by nebulization every 6 (six) hours as needed for wheezing or shortness of breath.   150 mL   1   . alendronate (FOSAMAX) 70 MG tablet   Oral   Take 1 tablet (70 mg total) by mouth every 7 (seven) days. Take with a full glass of water on an empty stomach.   4 tablet   11   . ALPRAZolam (XANAX) 0.5 MG tablet   Oral   Take 1 tablet (0.5 mg total) by mouth daily as needed for sleep or  anxiety.   90 tablet   0   . aspirin 81 MG EC tablet   Oral   Take 1 tablet (81 mg total) by mouth daily. Swallow whole.   30 tablet   12   . benzonatate (TESSALON) 100 MG capsule      TAKE TWO CAPSULES BY MOUTH THREE TIMES DAILY AS NEEDED FOR COUGH   90 capsule   0   . BRILINTA 90 MG TABS tablet   Oral   Take 1 tablet (90 mg total) by mouth 2 (two) times daily.   180 tablet   2     Dispense as written.   . budesonide-formoterol (SYMBICORT) 160-4.5 MCG/ACT inhaler   Inhalation   Inhale 2 puffs into the lungs 2 (two) times daily.   1 Inhaler   0   . COMBIVENT RESPIMAT 20-100 MCG/ACT AERS respimat   Inhalation   Inhale 1 puff into the lungs 3 (three) times daily as needed.   2 Inhaler   3     Dispense as written.   . ergocalciferol (DRISDOL) 50000 units capsule   Oral   Take 1 capsule (50,000 Units total) by mouth once a week. With Sunday dinner   4 capsule   0   . fluticasone (FLONASE) 50 MCG/ACT nasal spray   Each Nare   Place 2 sprays into both nostrils daily.   16 g   2   . furosemide (LASIX) 20 MG tablet   Oral   Take 1 tablet (20 mg total) by mouth daily.   90 tablet   1   . furosemide (LASIX) 20  MG tablet      TAKE ONE TABLET BY MOUTH ONCE DAILY   90 tablet   1   . insulin aspart protamine - aspart (NOVOLOG 70/30 MIX) (70-30) 100 UNIT/ML FlexPen      60  units before breakfast,  60 units before dinner.  NEEDS 15 PENS PER MONTH   105 mL   2   . isosorbide mononitrate (IMDUR) 30 MG 24 hr tablet      TAKE 1 TABLET DAILY   90 tablet   1   . losartan (COZAAR) 100 MG tablet   Oral   Take 1 tablet (100 mg total) by mouth daily.   90 tablet   2   . LYRICA 100 MG capsule      TAKE ONE CAPSULE BY MOUTH THREE TIMES DAILY   270 capsule   0   . metoprolol tartrate (LOPRESSOR) 12.5 mg TABS tablet   Oral   Take 12.5 mg by mouth 2 (two) times daily. Hold for SBP <110 and HR <60/min         . nitroGLYCERIN (NITROSTAT) 0.4 MG SL tablet   Sublingual   Place 1 tablet (0.4 mg total) under the tongue every 5 (five) minutes as needed for chest pain.   25 tablet   3   . PARoxetine (PAXIL) 10 MG tablet   Oral   Take 1 tablet (10 mg total) by mouth daily.   90 tablet   1   . rosuvastatin (CRESTOR) 20 MG tablet      TAKE 1 TABLET DAILY   90 tablet   1   . Spacer/Aero-Holding Chambers (AEROCHAMBER MV) inhaler      Use as instructed   1 each   0   . traMADol (ULTRAM) 50 MG tablet   Oral   Take 1 tablet (50 mg total) by mouth every 6 (  six) hours as needed for moderate pain.   16 tablet   0     Allergies Contrast media; Gabapentin; and Sulfa antibiotics  Family History  Problem Relation Age of Onset  . Heart disease Father   . Heart attack Father   . Heart disease Sister   . Heart attack Sister   . Heart disease Brother   . Heart attack Brother   . Asthma Grandchild   . Breast cancer Maternal Aunt     Social History Social History  Substance Use Topics  . Smoking status: Never Smoker   . Smokeless tobacco: Never Used  . Alcohol Use: No    Review of Systems Constitutional: No fever/chills. Feels generally "lightheaded" at times Eyes: No visual  changes. ENT: No sore throat. Cardiovascular: Denies chest pain. Respiratory: Denies shortness of breath. Gastrointestinal: No abdominal pain.  No nausea, no vomiting.  No diarrhea.  No constipation. Genitourinary: Negative for dysuria. Musculoskeletal: Negative for back pain. Skin: Negative for rash. Neurological: Negative for headaches, focal weakness. Tingling feeling in both hands and legs, is resolved.  10-point ROS otherwise negative.  ____________________________________________   PHYSICAL EXAM:  VITAL SIGNS: ED Triage Vitals  Enc Vitals Group     BP 09/12/15 1130 119/53 mmHg     Pulse Rate 09/12/15 1130 66     Resp 09/12/15 1130 16     Temp 09/12/15 1136 98.4 F (36.9 C)     Temp Source 09/12/15 1136 Oral     SpO2 09/12/15 1130 100 %     Weight 09/12/15 1136 190 lb (86.183 kg)     Height 09/12/15 1136 5\' 7"  (1.702 m)     Head Cir --      Peak Flow --      Pain Score --      Pain Loc --      Pain Edu? --      Excl. in [place]? --    Constitutional: Alert and oriented. Well appearing and in no acute distress. Eyes: Conjunctivae are normal. PERRL. EOMI. Head: Atraumatic. Nose: No congestion/rhinnorhea. Mouth/Throat: Mucous membranes are moist.  Oropharynx non-erythematous. Neck: No stridor.   Cardiovascular: Normal rate, regular rhythm. Grossly normal heart sounds.  Good peripheral circulation. No JVD. Respiratory: Normal respiratory effort.  No retractions. Lungs CTAB. Speaks in full and clear sentences. Normal respiratory rate. Gastrointestinal: Soft and nontender. No distention. Patient does have some obesity. Musculoskeletal: No lower extremity tenderness nor edema.   Neurologic:  Normal speech and language. No gross focal neurologic deficits are appreciated. Skin:  Skin is warm, dry and intact. No rash noted. Psychiatric: Mood and affect are normal. Speech and behavior are normal.  ____________________________________________   LABS (all labs ordered are  listed, but only abnormal results are displayed)  Labs Reviewed  BASIC METABOLIC PANEL - Abnormal; Notable for the following:    Glucose, Bld 200 (*)    BUN 26 (*)    Creatinine, Ser 1.55 (*)    GFR calc non Af Amer 32 (*)    GFR calc Af Amer 37 (*)    All other components within normal limits  CBC - Abnormal; Notable for the following:    Hemoglobin 11.7 (*)    HCT 34.2 (*)    RDW 15.0 (*)    All other components within normal limits  URINALYSIS COMPLETEWITH MICROSCOPIC ([facility] ONLY) - Abnormal; Notable for the following:    Color, Urine YELLOW (*)    APPearance CLOUDY (*)    Hgb  urine dipstick 1+ (*)    Protein, ur 100 (*)    Leukocytes, UA TRACE (*)    Bacteria, UA FEW (*)    Squamous Epithelial / LPF TOO NUMEROUS TO COUNT (*)    All other components within normal limits  GLUCOSE, CAPILLARY - Abnormal; Notable for the following:    Glucose-Capillary 188 (*)    All other components within normal limits  URINE CULTURE  TROPONIN I   ____________________________________________  EKG  Reviewed and interpreted by me at 1125 Heart rate 70 QRS 120 QTc 48 Right bundle-branch block,Left axis deviation and probable LVH. No evidence of acute ST elevation MI. As compared with previous EKG from February 2017, no changes noted. ____________________________________________  RADIOLOGY   CT Head Wo Contrast (Final result) Result time: 09/12/15 13:43:27   Final result by Rad Results In Interface (09/12/15 13:43:27)   Narrative:   CLINICAL DATA: Numbness throughout whole body starting this morning. No known injury.  EXAM: CT HEAD WITHOUT CONTRAST  TECHNIQUE: Contiguous axial images were obtained from the base of the skull through the vertex without intravenous contrast.  COMPARISON: Head CT dated 11/14/2013.  FINDINGS: Brain: There is mild generalized brain atrophy with commensurate dilatation of the ventricles and sulci. There is no mass, hemorrhage, edema, or other  evidence of acute parenchymal abnormality. No extra-axial hemorrhage.  Vascular: No hyperdense vessel or unexpected calcification. There are chronic calcified atherosclerotic changes of the large vessels at the skull base.  Skull: Negative for fracture or focal lesion.  Sinuses/Orbits: No acute findings.  Other: None.  IMPRESSION: No acute findings. No intracranial mass, hemorrhage or edema.   Electronically Signed By: Franki Cabot M.D. On: 09/12/2015 13:43          DG Chest 2 View (Final result) Result time: 09/12/15 12:46:47   Final result by Rad Results In Interface (09/12/15 12:46:47)   Narrative:   CLINICAL DATA: Shortness of breath and chest pain. History of sarcoidosis  EXAM: CHEST 2 VIEW  COMPARISON: Chest radiograph June 28, 2015 and chest CT July 11, 2015  FINDINGS: There is no edema or consolidation. Heart size and pulmonary vascularity are normal. No adenopathy. Calcified left mediastinal lymph nodes likely is secondary to sarcoidosis. No bone lesions. No pneumothorax.  IMPRESSION: Calcified lymph nodes, a finding likely secondary to prior sarcoidosis. No edema or consolidation.   Electronically Signed By: Lowella Grip III M.D. On: 09/12/2015 12:46       ____________________________________________   PROCEDURES  Procedure(s) performed: None  Critical Care performed: No  ____________________________________________   INITIAL IMPRESSION / ASSESSMENT AND PLAN / ED COURSE  Pertinent labs & imaging results that were available during my care of the patient were reviewed by me and considered in my medical decision making (see chart for details).  Patient presents for symptoms of generalized weakness, also paresthesias throughout the body. Neurologic exam is normal, no evidence of focal deficit. Her symptoms very atypical of any sort of acute stroke. Given her age however we will assess with CT of the screen for  acute cranial process. Troponin is normal, EKG no significant change. Her history does involve significant stress with the loss of a recent loved one, and she does report that she had travel and feels little bit weak and dehydrated. Her labs do seem to indicate some mild dehydration, and we will hydrate her here. She was orthostatic at the clinic, however not significantly orthostatic here.  Overall very reassuring exam. No clear focal symptoms. Very atypical paresthesias involving  both arms both legs, and she does report these are improved.  ----------------------------------------- 3:04 PM on 09/12/2015 -----------------------------------------  CT head no acute, chest x-ray no acute, labs reassuring. No evidence of cardiac etiology. No evidence CHF by chest x-ray or clinical exam. Her orthostatics are improved here, she feels improved after light hydration. Questionably a mild UTI versus contaminated sample, sent for culture and treated with single dose fosfomycin.  I will discharge the patient back to home, and referred her back to her primary care for close follow-up. Careful return precautions advised. Patient and family agreeable with plan. ____________________________________________   FINAL CLINICAL IMPRESSION(S) / ED DIAGNOSES  Final diagnoses:  General weakness      Delman Kitten, MD 09/12/15 [phone]

## 2015-09-12 NOTE — ED Notes (Signed)
Pt returned from radiology.

## 2015-09-12 NOTE — ED Notes (Addendum)
Pt comes into the ED via EMS from [place] PCP c/o chest pain and shortness of breath.  Patient has h/o 1 cardiac stent placed before.  Patient had orthostatic vital changes from 140's dropped to 90's when standing.  Denies N/V but has had dizziness when standing.  Patient given 324 asp in route.

## 2015-09-12 NOTE — Patient Instructions (Signed)
Patient to ED.

## 2015-09-13 ENCOUNTER — Telehealth: Payer: Self-pay

## 2015-09-13 NOTE — Telephone Encounter (Signed)
Phone call from pt.  Reported she was in the ER yesterday with dizziness and numbness all over.  Reported she has never had anything quite like it.  Stated that she has numbness in the bottom of feet that goes up into the thigh, bilaterally. Stated the numbness is worse when she 1st gets up, then it settles and stays in her feet and lower legs.  Reported she has "some pain" in feet and legs with walking; relieves with rest.  Denied any lower extremity rest pain.  Denied any color or temperature change of feet and legs. Also c/o intermittent headaches, and dizziness.  Reported she has intermittent heart palpitations.  Noted that 5/3 CT head was negative for acute changes.  Per the ER note the pt. Is to f/u with PCP for "close follow-up."  Advised pt. To call her PCP for further evaluation.  Verb. Understanding.  Advised will put her on cancellation list for her f/u appt. For PAD.

## 2015-09-14 LAB — URINE CULTURE: Special Requests: NORMAL

## 2015-09-14 NOTE — Telephone Encounter (Signed)
Added them to cancellation list.

## 2015-09-26 ENCOUNTER — Other Ambulatory Visit: Payer: Self-pay | Admitting: *Deleted

## 2015-09-26 ENCOUNTER — Encounter: Payer: Self-pay | Admitting: Internal Medicine

## 2015-09-26 ENCOUNTER — Telehealth: Payer: Self-pay | Admitting: Internal Medicine

## 2015-09-26 ENCOUNTER — Ambulatory Visit (INDEPENDENT_AMBULATORY_CARE_PROVIDER_SITE_OTHER): Payer: Medicare Other | Admitting: Internal Medicine

## 2015-09-26 VITALS — BP 118/58 | HR 67 | Temp 98.0°F | Resp 14 | Ht 67.0 in | Wt 194.0 lb

## 2015-09-26 DIAGNOSIS — I255 Ischemic cardiomyopathy: Secondary | ICD-10-CM

## 2015-09-26 DIAGNOSIS — E1165 Type 2 diabetes mellitus with hyperglycemia: Secondary | ICD-10-CM | POA: Diagnosis not present

## 2015-09-26 DIAGNOSIS — E785 Hyperlipidemia, unspecified: Secondary | ICD-10-CM | POA: Diagnosis not present

## 2015-09-26 DIAGNOSIS — IMO0002 Reserved for concepts with insufficient information to code with codable children: Secondary | ICD-10-CM

## 2015-09-26 DIAGNOSIS — F418 Other specified anxiety disorders: Secondary | ICD-10-CM

## 2015-09-26 DIAGNOSIS — Z794 Long term (current) use of insulin: Secondary | ICD-10-CM

## 2015-09-26 DIAGNOSIS — E1140 Type 2 diabetes mellitus with diabetic neuropathy, unspecified: Secondary | ICD-10-CM

## 2015-09-26 DIAGNOSIS — J441 Chronic obstructive pulmonary disease with (acute) exacerbation: Secondary | ICD-10-CM

## 2015-09-26 DIAGNOSIS — R42 Dizziness and giddiness: Secondary | ICD-10-CM | POA: Diagnosis not present

## 2015-09-26 DIAGNOSIS — G4733 Obstructive sleep apnea (adult) (pediatric): Secondary | ICD-10-CM

## 2015-09-26 DIAGNOSIS — Z9989 Dependence on other enabling machines and devices: Secondary | ICD-10-CM

## 2015-09-26 DIAGNOSIS — E1121 Type 2 diabetes mellitus with diabetic nephropathy: Secondary | ICD-10-CM

## 2015-09-26 LAB — MICROALBUMIN / CREATININE URINE RATIO
Creatinine,U: 167.7 mg/dL
Microalb Creat Ratio: 17.2 mg/g (ref 0.0–30.0)
Microalb, Ur: 28.9 mg/dL — ABNORMAL HIGH (ref 0.0–1.9)

## 2015-09-26 LAB — LDL CHOLESTEROL, DIRECT: LDL DIRECT: 73 mg/dL

## 2015-09-26 LAB — COMPREHENSIVE METABOLIC PANEL
ALT: 14 U/L (ref 0–35)
AST: 16 U/L (ref 0–37)
Albumin: 3.9 g/dL (ref 3.5–5.2)
Alkaline Phosphatase: 48 U/L (ref 39–117)
BILIRUBIN TOTAL: 0.8 mg/dL (ref 0.2–1.2)
BUN: 36 mg/dL — ABNORMAL HIGH (ref 6–23)
CO2: 24 meq/L (ref 19–32)
Calcium: 9.9 mg/dL (ref 8.4–10.5)
Chloride: 107 mEq/L (ref 96–112)
Creatinine, Ser: 1.76 mg/dL — ABNORMAL HIGH (ref 0.40–1.20)
GFR: 36.16 mL/min — AB (ref >60.00)
GLUCOSE: 127 mg/dL — AB (ref 70–99)
POTASSIUM: 4.4 meq/L (ref 3.5–5.1)
SODIUM: 139 meq/L (ref 135–145)
TOTAL PROTEIN: 7.1 g/dL (ref 6.0–8.3)

## 2015-09-26 LAB — VITAMIN B12: Vitamin B-12: 332 pg/mL (ref 211–911)

## 2015-09-26 LAB — HEMOGLOBIN A1C: HEMOGLOBIN A1C: 9.6 % — AB (ref 4.6–6.5)

## 2015-09-26 MED ORDER — POLYETHYLENE GLYCOL 3350 17 GM/SCOOP PO POWD: 17.0000 g | Freq: Every day | ORAL | Status: DC

## 2015-09-26 MED ORDER — DOCUSATE SODIUM 100 MG PO CAPS: 200.0000 mg | ORAL_CAPSULE | Freq: Every day | ORAL | Status: DC

## 2015-09-26 MED ORDER — PAROXETINE HCL 20 MG PO TABS: 20.0000 mg | ORAL_TABLET | Freq: Every day | ORAL | Status: DC

## 2015-09-26 NOTE — Telephone Encounter (Signed)
Called [facility] and was advised by Shanon Brow that patient has not received supplies due to patient is in collections Patient needs to call [facility] at [phone] and they will help with overdue account so patient can receive CPAP supplies. Patient owes low amount.

## 2015-09-26 NOTE — Progress Notes (Addendum)
Subjective:  Patient ID: Pamela Collier, female    DOB: 1940/06/24  Age: 75 y.o. MRN: [id number]  CC: The primary encounter diagnosis was Type 2 diabetes mellitus, uncontrolled, with neuropathy ([place]). Diagnoses of Hyperlipidemia LDL goal <100, Dizziness and giddiness, OSA on CPAP, Depression with anxiety, and Uncontrolled type 2 diabetes mellitus with diabetic nephropathy, with long-term current use of insulin (HCC) were also pertinent to this visit.  HPI Pamela Collier presents for ER follow up and DM follow up  ER May 3:  Patient was referred to ER by Dr Caryl Bis after reporting  Chest pain, shortness of breath,  7 lb weight gain and bilateral numbness.  She was ruled out for ischemia with  Normal troponins x 2,  Chest x ray failed to show acute changes,  And CT scan was negative for ischemia.  Symptoms were considered to be a grief reaction to the recent death of her brother,  Complicated by orthostasis .  All imaging studies and labs were reviewed with patient today .  Grief: she continues to feel depressed and tearful.  She requests modificatio nof SSRI . wants to increase paxil  DM:  She has been Stress eating,  Sugars have been over 200 both am and pm occasionally ,  Nothing below 179 ! Using mixed insulin, 44 units in the am and 60 at night   Podiatry and vascular follow up in June  Having bilateral lower abd pain worse in the am when she wakes up,  relieved partially with moving bowels,  The pain is camping in quality,  occurs once or twice a week , not accompanied by  nausea or gas.  Constipated    Gets dizzy frequently in the morning , symptoms lasts until lunch time..  Has OSA and uses CPAP but has run out of supplies using the mask that is supposed to be changed once a month,  Has been using the same one for a while. Dr [place] Bells has ordered the supplies.      Lab Results  Component Value Date   HGBA1C 9.6* 09/26/2015   Lab Results  Component Value Date   CREATININE 1.76*  09/26/2015     Outpatient Prescriptions Prior to Visit  Medication Sig Dispense Refill  . acetaminophen (TYLENOL) 325 MG tablet Take 2 tablets (650 mg total) by mouth every 6 (six) hours as needed for mild pain (headache). 20 tablet 0  . albuterol (PROVENTIL) (2.5 MG/3ML) 0.083% nebulizer solution Take 3 mLs (2.5 mg total) by nebulization every 6 (six) hours as needed for wheezing or shortness of breath. 150 mL 1  . alendronate (FOSAMAX) 70 MG tablet Take 1 tablet (70 mg total) by mouth every 7 (seven) days. Take with a full glass of water on an empty stomach. 4 tablet 11  . ALPRAZolam (XANAX) 0.5 MG tablet Take 1 tablet (0.5 mg total) by mouth daily as needed for sleep or anxiety. 90 tablet 0  . aspirin 81 MG EC tablet Take 1 tablet (81 mg total) by mouth daily. Swallow whole. 30 tablet 12  . benzonatate (TESSALON) 100 MG capsule TAKE TWO CAPSULES BY MOUTH THREE TIMES DAILY AS NEEDED FOR COUGH 90 capsule 0  . BRILINTA 90 MG TABS tablet Take 1 tablet (90 mg total) by mouth 2 (two) times daily. 180 tablet 2  . budesonide-formoterol (SYMBICORT) 160-4.5 MCG/ACT inhaler Inhale 2 puffs into the lungs 2 (two) times daily. 1 Inhaler 0  . COMBIVENT RESPIMAT 20-100 MCG/ACT AERS respimat Inhale 1  puff into the lungs 3 (three) times daily as needed. 2 Inhaler 3  . ergocalciferol (DRISDOL) 50000 units capsule Take 1 capsule (50,000 Units total) by mouth once a week. With Sunday dinner 4 capsule 0  . fluticasone (FLONASE) 50 MCG/ACT nasal spray Place 2 sprays into both nostrils daily. 16 g 2  . furosemide (LASIX) 20 MG tablet Take 1 tablet (20 mg total) by mouth daily. 90 tablet 1  . insulin aspart protamine - aspart (NOVOLOG 70/30 MIX) (70-30) 100 UNIT/ML FlexPen 60 units before breakfast,  60 units before dinner.  NEEDS 15 PENS PER MONTH 105 mL 2  . isosorbide mononitrate (IMDUR) 30 MG 24 hr tablet TAKE 1 TABLET DAILY 90 tablet 1  . losartan (COZAAR) 100 MG tablet Take 1 tablet (100 mg total) by mouth  daily. 90 tablet 2  . LYRICA 100 MG capsule TAKE ONE CAPSULE BY MOUTH THREE TIMES DAILY 270 capsule 0  . metoprolol tartrate (LOPRESSOR) 12.5 mg TABS tablet Take 12.5 mg by mouth 2 (two) times daily. Hold for SBP <110 and HR <60/min    . nitroGLYCERIN (NITROSTAT) 0.4 MG SL tablet Place 1 tablet (0.4 mg total) under the tongue every 5 (five) minutes as needed for chest pain. 25 tablet 3  . rosuvastatin (CRESTOR) 20 MG tablet TAKE 1 TABLET DAILY 90 tablet 1  . Spacer/Aero-Holding Chambers (AEROCHAMBER MV) inhaler Use as instructed 1 each 0  . PARoxetine (PAXIL) 10 MG tablet Take 1 tablet (10 mg total) by mouth daily. 90 tablet 1  . traMADol (ULTRAM) 50 MG tablet Take 1 tablet (50 mg total) by mouth every 6 (six) hours as needed for moderate pain. (Patient not taking: Reported on 09/26/2015) 16 tablet 0  . furosemide (LASIX) 20 MG tablet TAKE ONE TABLET BY MOUTH ONCE DAILY 90 tablet 1   Facility-Administered Medications Prior to Visit  Medication Dose Route Frequency Provider Last Rate Last Dose  . albuterol (PROVENTIL) (5 MG/ML) 0.5% nebulizer solution 2.5 mg  2.5 mg Nebulization Once [name], MD        Review of Systems;  Patient denies headache, fevers, malaise, unintentional weight loss, skin rash, eye pain, sinus congestion and sinus pain, sore throat, dysphagia,  hemoptysis , cough, dyspnea, wheezing, chest pain, palpitations, orthopnea, edema, abdominal pain, nausea, melena, diarrhea, constipation, flank pain, dysuria, hematuria, urinary  Frequency, nocturia, numbness, tingling, seizures,  Focal weakness, Loss of consciousness,  Tremor, insomnia, depression, anxiety, and suicidal ideation.      Objective:  BP 118/58 mmHg  Pulse 67  Temp(Src) 98 F (36.7 C) (Oral)  Resp 14  Ht 5\' 7"  (1.702 m)  Wt 194 lb (87.998 kg)  BMI 30.38 kg/m2  SpO2 98%  BP Readings from Last 3 Encounters:  09/26/15 118/58  09/12/15 133/56  09/12/15 140/60    Wt Readings from Last 3 Encounters:    09/26/15 194 lb (87.998 kg)  09/12/15 190 lb (86.183 kg)  09/12/15 193 lb (87.544 kg)    General appearance: alert, cooperative and appears stated age Ears: normal TM's and external ear canals both ears Throat: lips, mucosa, and tongue normal; teeth and gums normal Neck: no adenopathy, no carotid bruit, supple, symmetrical, trachea midline and thyroid not enlarged, symmetric, no tenderness/mass/nodules Back: symmetric, no curvature. ROM normal. No CVA tenderness. Lungs: clear to auscultation bilaterally Heart: regular rate and rhythm, S1, S2 normal, no murmur, click, rub or gallop Abdomen: soft, non-tender; bowel sounds normal; no masses,  no organomegaly Pulses: 2+ and symmetric Skin:  Skin color, texture, turgor normal. No rashes or lesions Lymph nodes: Cervical, supraclavicular, and axillary nodes normal.  Lab Results  Component Value Date   HGBA1C 9.6* 09/26/2015   HGBA1C 7.8* 06/08/2015   HGBA1C 7.5* 03/05/2015    Lab Results  Component Value Date   CREATININE 1.76* 09/26/2015   CREATININE 1.55* 09/12/2015   CREATININE 1.43* 06/29/2015    Lab Results  Component Value Date   WBC 7.3 09/12/2015   HGB 11.7* 09/12/2015   HCT 34.2* 09/12/2015   PLT 281 09/12/2015   GLUCOSE 127* 09/26/2015   CHOL 151 06/08/2015   TRIG 148.0 06/08/2015   HDL 31.80* 06/08/2015   LDLDIRECT 73.0 09/26/2015   LDLCALC 89 06/08/2015   ALT 14 09/26/2015   AST 16 09/26/2015   NA 139 09/26/2015   K 4.4 09/26/2015   CL 107 09/26/2015   CREATININE 1.76* 09/26/2015   BUN 36* 09/26/2015   CO2 24 09/26/2015   TSH 1.16 07/01/2013   INR 1.2* 02/02/2015   HGBA1C 9.6* 09/26/2015   MICROALBUR 28.9* 09/26/2015    Dg Chest 2 View  09/12/2015  CLINICAL DATA:  Shortness of breath and chest pain. History of sarcoidosis EXAM: CHEST  2 VIEW COMPARISON:  Chest radiograph June 28, 2015 and chest CT July 11, 2015 FINDINGS: There is no edema or consolidation. Heart size and pulmonary vascularity are  normal. No adenopathy. Calcified left mediastinal lymph nodes likely is secondary to sarcoidosis. No bone lesions. No pneumothorax. IMPRESSION: Calcified lymph nodes, a finding likely secondary to prior sarcoidosis. No edema or consolidation. Electronically Signed   By: Lowella Grip III M.D.   On: 09/12/2015 12:46   Ct Head Wo Contrast  09/12/2015  CLINICAL DATA:  Numbness throughout whole body starting this morning. No known injury. EXAM: CT HEAD WITHOUT CONTRAST TECHNIQUE: Contiguous axial images were obtained from the base of the skull through the vertex without intravenous contrast. COMPARISON:  Head CT dated 11/14/2013. FINDINGS: Brain: There is mild generalized brain atrophy with commensurate dilatation of the ventricles and sulci. There is no mass, hemorrhage, edema, or other evidence of acute parenchymal abnormality. No extra-axial hemorrhage. Vascular: No hyperdense vessel or unexpected calcification. There are chronic calcified atherosclerotic changes of the large vessels at the skull base. Skull: Negative for fracture or focal lesion. Sinuses/Orbits: No acute findings. Other: None. IMPRESSION: No acute findings.  No intracranial mass, hemorrhage or edema. Electronically Signed   By: Franki Cabot M.D.   On: 09/12/2015 13:43    Assessment & Plan:   Problem List Items Addressed This Visit    DM (diabetes mellitus), type 2, uncontrolled, with renal complications ([place])    Uncontrolled by recent A1c ,  With A1c over 9.0.   A dose of insulin will be adjusted up   Based on recent blood sugars and referral for [facility] health for home diabetes education    Lab Results  Component Value Date   HGBA1C 9.6* 09/26/2015   Lab Results  Component Value Date   MICROALBUR 28.9* 09/26/2015        Depression with anxiety    Worsening, aggravated by recent death of breath. agree with increasing her dose of paxil       OSA on CPAP    Currently treated with BIPAP but the question remains  whether her current CPAP settings are acurate .      Type 2 diabetes mellitus, uncontrolled, with neuropathy ([place]) - Primary   Relevant Orders   Hemoglobin A1c (Completed)  Microalbumin / creatinine urine ratio (Completed)   Comprehensive metabolic panel (Completed)   Hyperlipidemia LDL goal <100   Relevant Orders   LDL cholesterol, direct (Completed)    Other Visit Diagnoses    Dizziness and giddiness        Relevant Orders    B12 (Completed)       I have changed Pamela Collier PARoxetine. I am also having her start on docusate sodium and polyethylene glycol powder. Additionally, I am having her maintain her aspirin, ALPRAZolam, furosemide, losartan, insulin aspart protamine - aspart, BRILINTA, fluticasone, COMBIVENT RESPIMAT, albuterol, AEROCHAMBER MV, budesonide-formoterol, nitroGLYCERIN, metoprolol tartrate, ergocalciferol, LYRICA, benzonatate, isosorbide mononitrate, traMADol, acetaminophen, rosuvastatin, and alendronate.  Meds ordered this encounter  Medications  . PARoxetine (PAXIL) 20 MG tablet    Sig: Take 1 tablet (20 mg total) by mouth daily.    Dispense:  30 tablet    Refill:  1  . docusate sodium (COLACE) 100 MG capsule    Sig: Take 2 capsules (200 mg total) by mouth at bedtime.    Dispense:  60 capsule    Refill:  3  . polyethylene glycol powder (GLYCOLAX/MIRALAX) powder    Sig: Take 17 g by mouth daily.    Dispense:  3350 g    Refill:  1    Medications Discontinued During This Encounter  Medication Reason  . furosemide (LASIX) 20 MG tablet Duplicate  . PARoxetine (PAXIL) 10 MG tablet Reorder    Follow-up: Return in about 3 months (around 12/27/2015) for follow up diabetes.   [name], MD

## 2015-09-26 NOTE — Patient Outreach (Addendum)
[facility]) Care Management  09/26/2015  ADVITHA FINTON 1941-04-22 [id number]  Subjective: Telephone call to patient's home number, spoke with patient, and HIPAA verified.  Patient states she is doing ok and trying to do the best she can.  Discussed [facility] Care Management services and patient in agreement to receive services.  Patient states she saw her primary MD Dr. Derrel Nip today and MD has increased her anxiety medication.  States she is able to afford medications currently but sometimes it gets hard to balance all of her bills with her limited income.  Patient states she is currently managing all of her bills.   States she just sent in a payment to [employer] so she could start receiving her CPAP supplies again.   States she has not received CPAP supplies in over 4 months because she was not able to pay her bill.     Patient states she is the primary caregiver for her husband.  States she does her best to care for herself and her husband, and ask for assistance when needed if available.  States her son lives out of state and not able to assist at times.   Patient states she does not have any transportation issues and is able to drive self to appointments.  Patient states she must get off the phone and take her insulin that she forgot to take earlier.  Patient in agreement with referral to [facility] for CPAP supplies care coordination, COPD disease management, and disease monitoring.   Objective: Per chart review: Patient had ED visit on 09/12/15 for chest pain.   Patient has a history diabetes, COPD, chronic diastolic heart failure, CAD, hypertension, and hyperlipidemia.   Patient has received [facility] services in the past and completed transition of care on 07/30/15.    Assessment: Received EMMI Prevention Call referral from [facility] Management Assistant on 09/25/15.   Referral source: Lurline Del.    Referral reason: Engagement tool completed on 09/25/15.  Lattie Haw,  spoke with patient on 09/25/15. States she has breathing problems (COPD), uses a cane and walker at home, She does drives, states she is caregiver for her husband whom is not doing well. (she mentioned he may go to inpatient rehab facility) She has fallen more than once in the last year.   Telephone screen completed and no patient has no telephonic RNCM needs at this time.   Patient will continue to receive [facility] Management services.   Plan: RNCM will refer patient to [facility] for CPAP supplies care coordination, COPD disease management, and disease monitoring.    Jairy Angulo H. Annia Friendly, BSN, [place] [facility] Telephonic CM Phone: [phone] Fax: [phone]

## 2015-09-26 NOTE — Patient Instructions (Addendum)
I have increased your Paxil to 20 mg daily  For your depression . [name] You can take it at night if it makes you sleepy   Your blood sugars are too high.  Please Increase your insulin to 47 units in the morning and 63 units in the evening   Each week increase by 3 more units if BS are not < 140 n the morningr < 160 in the evening  Or you can mail me the blood sugarseach week and I'll tell you what to do   Your abdominal pain is likely from chronic constipation:   You can take colace (docusate) a stool softener,  And Miralax,  Bulk forming laxative,  Every day to keep bowels moving   You can use Milk of Magnesium not more than twice weekly

## 2015-09-26 NOTE — Progress Notes (Signed)
Pre-visit discussion using our clinic review tool. No additional management support is needed unless otherwise documented below in the visit note.  

## 2015-09-27 DIAGNOSIS — E1140 Type 2 diabetes mellitus with diabetic neuropathy, unspecified: Secondary | ICD-10-CM | POA: Insufficient documentation

## 2015-09-27 DIAGNOSIS — E1165 Type 2 diabetes mellitus with hyperglycemia: Principal | ICD-10-CM

## 2015-09-27 DIAGNOSIS — IMO0002 Reserved for concepts with insufficient information to code with codable children: Secondary | ICD-10-CM | POA: Insufficient documentation

## 2015-09-27 NOTE — Assessment & Plan Note (Addendum)
Uncontrolled by recent A1c ,  With A1c over 9.0.   A dose of insulin adjusted up   Based on recent blood sugars,   Lab Results  Component Value Date   HGBA1C 9.6* 09/26/2015   Lab Results  Component Value Date   MICROALBUR 28.9* 09/26/2015

## 2015-09-27 NOTE — Assessment & Plan Note (Addendum)
Worsening, aggravated by recent death of breath. agree with increasing her dose of paxil

## 2015-09-27 NOTE — Assessment & Plan Note (Signed)
Currently treated with BIPAP but the question remains whether her current CPAP settings are acurate .

## 2015-09-28 ENCOUNTER — Emergency Department: Payer: Medicare Other

## 2015-09-28 ENCOUNTER — Emergency Department
Admission: EM | Admit: 2015-09-28 | Discharge: 2015-09-28 | Disposition: A | Discharge status: Home / Self Care | Payer: Medicare Other | Attending: Emergency Medicine | Admitting: Emergency Medicine

## 2015-09-28 ENCOUNTER — Encounter: Payer: Self-pay | Admitting: Emergency Medicine

## 2015-09-28 DIAGNOSIS — E1152 Type 2 diabetes mellitus with diabetic peripheral angiopathy with gangrene: Secondary | ICD-10-CM | POA: Diagnosis not present

## 2015-09-28 DIAGNOSIS — I5032 Chronic diastolic (congestive) heart failure: Secondary | ICD-10-CM | POA: Diagnosis not present

## 2015-09-28 DIAGNOSIS — E1122 Type 2 diabetes mellitus with diabetic chronic kidney disease: Secondary | ICD-10-CM | POA: Diagnosis not present

## 2015-09-28 DIAGNOSIS — G629 Polyneuropathy, unspecified: Secondary | ICD-10-CM | POA: Diagnosis not present

## 2015-09-28 DIAGNOSIS — R202 Paresthesia of skin: Secondary | ICD-10-CM | POA: Diagnosis present

## 2015-09-28 DIAGNOSIS — J449 Chronic obstructive pulmonary disease, unspecified: Secondary | ICD-10-CM | POA: Diagnosis not present

## 2015-09-28 DIAGNOSIS — M79604 Pain in right leg: Secondary | ICD-10-CM

## 2015-09-28 DIAGNOSIS — Z79899 Other long term (current) drug therapy: Secondary | ICD-10-CM | POA: Diagnosis not present

## 2015-09-28 DIAGNOSIS — E785 Hyperlipidemia, unspecified: Secondary | ICD-10-CM | POA: Diagnosis not present

## 2015-09-28 DIAGNOSIS — Z7982 Long term (current) use of aspirin: Secondary | ICD-10-CM | POA: Diagnosis not present

## 2015-09-28 DIAGNOSIS — M1990 Unspecified osteoarthritis, unspecified site: Secondary | ICD-10-CM | POA: Diagnosis not present

## 2015-09-28 DIAGNOSIS — M79605 Pain in left leg: Secondary | ICD-10-CM | POA: Diagnosis not present

## 2015-09-28 DIAGNOSIS — Z794 Long term (current) use of insulin: Secondary | ICD-10-CM | POA: Insufficient documentation

## 2015-09-28 DIAGNOSIS — J9600 Acute respiratory failure, unspecified whether with hypoxia or hypercapnia: Secondary | ICD-10-CM | POA: Insufficient documentation

## 2015-09-28 DIAGNOSIS — I110 Hypertensive heart disease with heart failure: Secondary | ICD-10-CM | POA: Diagnosis not present

## 2015-09-28 DIAGNOSIS — I255 Ischemic cardiomyopathy: Secondary | ICD-10-CM | POA: Insufficient documentation

## 2015-09-28 DIAGNOSIS — F329 Major depressive disorder, single episode, unspecified: Secondary | ICD-10-CM | POA: Insufficient documentation

## 2015-09-28 DIAGNOSIS — I2510 Atherosclerotic heart disease of native coronary artery without angina pectoris: Secondary | ICD-10-CM | POA: Diagnosis not present

## 2015-09-28 DIAGNOSIS — E1140 Type 2 diabetes mellitus with diabetic neuropathy, unspecified: Secondary | ICD-10-CM | POA: Insufficient documentation

## 2015-09-28 DIAGNOSIS — N189 Chronic kidney disease, unspecified: Secondary | ICD-10-CM | POA: Insufficient documentation

## 2015-09-28 DIAGNOSIS — Z0389 Encounter for observation for other suspected diseases and conditions ruled out: Secondary | ICD-10-CM | POA: Diagnosis not present

## 2015-09-28 LAB — CBC WITH DIFFERENTIAL/PLATELET
Basophils Absolute: 0 10*3/uL (ref 0–0.1)
Basophils Relative: 1 %
EOS ABS: 0.1 10*3/uL (ref 0–0.7)
EOS PCT: 2 %
HCT: 34.3 % — ABNORMAL LOW (ref 35.0–47.0)
HEMOGLOBIN: 11.7 g/dL — AB (ref 12.0–16.0)
LYMPHS ABS: 1.2 10*3/uL (ref 1.0–3.6)
Lymphocytes Relative: 20 %
MCH: 30.2 pg (ref 26.0–34.0)
MCHC: 34.2 g/dL (ref 32.0–36.0)
MCV: 88.2 fL (ref 80.0–100.0)
MONOS PCT: 10 %
Monocytes Absolute: 0.6 10*3/uL (ref 0.2–0.9)
NEUTROS PCT: 69 %
Neutro Abs: 4.2 10*3/uL (ref 1.4–6.5)
PLATELETS: 253 10*3/uL (ref 150–440)
RBC: 3.89 MIL/uL (ref 3.80–5.20)
RDW: 15 % — ABNORMAL HIGH (ref 11.5–14.5)
WBC: 6.1 10*3/uL (ref 3.6–11.0)

## 2015-09-28 LAB — COMPREHENSIVE METABOLIC PANEL
ALT: 16 U/L (ref 14–54)
ANION GAP: 6 (ref 5–15)
AST: 20 U/L (ref 15–41)
Albumin: 3.8 g/dL (ref 3.5–5.0)
Alkaline Phosphatase: 49 U/L (ref 38–126)
BUN: 34 mg/dL — ABNORMAL HIGH (ref 6–20)
CALCIUM: 9.9 mg/dL (ref 8.9–10.3)
CO2: 25 mmol/L (ref 22–32)
Chloride: 108 mmol/L (ref 101–111)
Creatinine, Ser: 1.72 mg/dL — ABNORMAL HIGH (ref 0.44–1.00)
GFR calc non Af Amer: 28 mL/min — ABNORMAL LOW (ref >60)
GFR, EST AFRICAN AMERICAN: 32 mL/min — AB (ref >60)
Glucose, Bld: 183 mg/dL — ABNORMAL HIGH (ref 65–99)
Potassium: 4.9 mmol/L (ref 3.5–5.1)
SODIUM: 139 mmol/L (ref 135–145)
Total Bilirubin: 1 mg/dL (ref 0.3–1.2)
Total Protein: 7.6 g/dL (ref 6.5–8.1)

## 2015-09-28 LAB — TROPONIN I: TROPONIN I: 0.03 ng/mL (ref <0.031)

## 2015-09-28 LAB — GLUCOSE, CAPILLARY: GLUCOSE-CAPILLARY: 157 mg/dL — AB (ref 65–99)

## 2015-09-28 NOTE — Addendum Note (Signed)
Addended by: [name] on: 09/28/2015 12:24 PM   Modules accepted: Orders

## 2015-09-28 NOTE — Discharge Instructions (Signed)
You were evaluated for bilateral leg pain, and your exam and evaluation are reassuring today in emergency department. I suspect neuropathy as a source of ear pain. Discuss further management with your primary care physician.  Return to the emergency department for any worsening condition including one-sided weakness or numbness, confusion or altered mental status, skin rash, or off balance.   Peripheral Neuropathy Peripheral neuropathy is a type of nerve damage. It affects nerves that carry signals between the spinal cord and other parts of the body. These are called peripheral nerves. With peripheral neuropathy, one nerve or a group of nerves may be damaged.  CAUSES  Many things can damage peripheral nerves. For some people with peripheral neuropathy, the cause is unknown. Some causes include:  Diabetes. This is the most common cause of peripheral neuropathy.  Injury to a nerve.  Pressure or stress on a nerve that lasts a long time.  Too little vitamin B. Alcoholism can lead to this.  Infections.  Autoimmune diseases, such as multiple sclerosis and systemic lupus erythematosus.  Inherited nerve diseases.  Some medicines, such as cancer drugs.  Toxic substances, such as lead and mercury.  Too little blood flowing to the legs.  Kidney disease.  Thyroid disease. SIGNS AND SYMPTOMS  Different people have different symptoms. The symptoms you have will depend on which of your nerves is damaged. Common symptoms include:  Loss of feeling (numbness) in the feet and hands.  Tingling in the feet and hands.  Pain that burns.  Very sensitive skin.  Weakness.  Not being able to move a part of the body (paralysis).  Muscle twitching.  Clumsiness or poor coordination.  Loss of balance.  Not being able to control your bladder.  Feeling dizzy.  Sexual problems. DIAGNOSIS  Peripheral neuropathy is a symptom, not a disease. Finding the cause of peripheral neuropathy can be  hard. To figure that out, your health care provider will take a medical history and do a physical exam. A neurological exam will also be done. This involves checking things affected by your brain, spinal cord, and nerves (nervous system). For example, your health care provider will check your reflexes, how you move, and what you can feel.  Other types of tests may also be ordered, such as:  Blood tests.  A test of the fluid in your spinal cord.  Imaging tests, such as CT scans or an MRI.  Electromyography (EMG). This test checks the nerves that control muscles.  Nerve conduction velocity tests. These tests check how fast messages pass through your nerves.  Nerve biopsy. A small piece of nerve is removed. It is then checked under a microscope. TREATMENT   Medicine is often used to treat peripheral neuropathy. Medicines may include:  Pain-relieving medicines. Prescription or over-the-counter medicine may be suggested.  Antiseizure medicine. This may be used for pain.  Antidepressants. These also may help ease pain from neuropathy.  Lidocaine. This is a numbing medicine. You might wear a patch or be given a shot.  Mexiletine. This medicine is typically used to help control irregular heart rhythms.  Surgery. Surgery may be needed to relieve pressure on a nerve or to destroy a nerve that is causing pain.  Physical therapy to help movement.  Assistive devices to help movement. HOME CARE INSTRUCTIONS   Only take over-the-counter or prescription medicines as directed by your health care provider. Follow the instructions carefully for any given medicines. Do not take any other medicines without first getting approval from your  health care provider.  If you have diabetes, work closely with your health care provider to keep your blood sugar under control.  If you have numbness in your feet:  Check every day for signs of injury or infection. Watch for redness, warmth, and  swelling.  Wear padded socks and comfortable shoes. These help protect your feet.  Do not do things that put pressure on your damaged nerve.  Do not smoke. Smoking keeps blood from getting to damaged nerves.  Avoid or limit alcohol. Too much alcohol can cause a lack of B vitamins. These vitamins are needed for healthy nerves.  Develop a good support system. Coping with peripheral neuropathy can be stressful. Talk to a mental health specialist or join a support group if you are struggling.  Follow up with your health care provider as directed. SEEK MEDICAL CARE IF:   You have new signs or symptoms of peripheral neuropathy.  You are struggling emotionally from dealing with peripheral neuropathy.  You have a fever. SEEK IMMEDIATE MEDICAL CARE IF:   You have an injury or infection that is not healing.  You feel very dizzy or begin vomiting.  You have chest pain.  You have trouble breathing.   This information is not intended to replace advice given to you by your health care provider. Make sure you discuss any questions you have with your health care provider.   Document Released: 04/18/2002 Document Revised: 01/08/2011 Document Reviewed: 01/03/2013 Elsevier Interactive Patient Education [employer].

## 2015-09-28 NOTE — ED Provider Notes (Signed)
[facility] Emergency Department Provider Note   ____________________________________________  Time seen: I have reviewed the triage vital signs and the triage nursing note.  HISTORY  Chief Complaint Numbness   Historian Patient and spouse  HPI Pamela Collier is a 75 y.o. female with a history of coronary disease, peripheral vascular disease, renal insufficiency, diabetes, and what sounds like a diagnosis of peripheral neuropathy, is presenting here due to bilateral lower extremity pain from the calves down to the feet. Nothing makes it worse or better. She has had before but it seems to be progressing to her. She saw her primary care physician on Wednesday, and they discussed the possibility of ruling out blood clots, and the patient got to think about this and felt like she wanted this evaluated. She states that she occasionally has numbness and tingling in her fingers, but none today. No reported bowel tones. No trouble with speech, slurred speech, finding her words. No weakness in the lower or upper extremities. She does follow with Dr. due for vascular surgery for peripheral vascular disease. Does not report problem with exertion specifically, nor any cold extremity.  Symptoms are moderate.    Past Medical History  Diagnosis Date  . Coronary artery disease, occlusive     a. Previous PCI to the LAD, LCx, and RCA in 2010, 2011, 2013, and 2016. All performed in [place].  . Cardiomyopathy, ischemic     a. EF 35 to 40% by echo in 2013 b. EF improved to 50-55% by echo in 04/2015.  [name] Hepatic steatosis     by CT abd pelvis  . Osteoporosis, post-menopausal   . 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 Atherosclerotic peripheral vascular disease with gangrene [facility]) august 2012  . Hypertensive heart disease   . Sarcoidosis ([place])   . Chronic diastolic CHF (congestive heart failure)  (HCC)     a. EF 50-55% by echo in 04/2015  . Renal insufficiency   . Pleural effusion 10/25/2012    10/2012 CT chest >> small to moderate R lung effusion>> chylothorax, 100% lymphs 10/2013 thoracentesis> cytology negative, WBC 1471, > 90% "small lymphs" 01/2014 CT chest> near complete resolution of pleural effusion, stable lymphadenopathy 08/2014 CT chest [place] ([facility]): small right sided effusion decreased in size, stable mediastinal lymphadenopathy 1.0cm largest, 76m  . 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 Acute respiratory failure ([place]) 11/21/2014  . Diabetic diarrhea ([place]) 10/03/2014  . DM type 2, uncontrolled, with renal complications ([place]) [id number]  . Cerebral infarct ([place]) 08/17/2013  . Arthritis 08/05/2013  . 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 Hyperlipidemia LDL goal <100 02/23/2014  . COPD (chronic obstructive pulmonary disease) [facility])     Patient Active Problem List   Diagnosis Date Noted  . Type 2 diabetes mellitus, uncontrolled, with neuropathy ([place]) 09/27/2015  . Hypertensive heart disease   . Coronary artery disease, occlusive   . OSA on CPAP 07/07/2015  . Hospital discharge follow-up 07/07/2015  . Chest pain with low risk of acute coronary syndrome 06/29/2015  . Community acquired pneumonia   . Pleural effusion, right   . Coronary  artery disease involving native coronary artery of native heart with angina pectoris ([place])   . Diarrhea 05/15/2015  . Fall 04/19/2015  . Acute on chronic kidney failure ([place]) 04/19/2015  . Chronic diastolic CHF (congestive heart failure) ([place]) 04/18/2015  . Diabetic neuropathy ([place]) 02/21/2015  . Benign neoplasm of sigmoid colon   . Diabetic diarrhea ([place]) 10/03/2014  . Low back pain with  radiation 04/04/2014  . Essential hypertension 04/04/2014  . Hyperlipidemia LDL goal <100 02/23/2014  . Long-term use of high-risk medication 02/23/2014  . Dermatophytic onychia 08/17/2013  . Angina pectoris ([place]) 08/17/2013  . Arteriosclerosis of coronary artery 08/17/2013  . Cerebral infarct ([place]) 08/17/2013  . Bony exostosis 08/17/2013  . Arthritis 08/05/2013  . Unsteady gait 03/24/2013  . History of colonic polyps 02/20/2013  . 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 Dysphagia 12/07/2012  . Cough 10/25/2012  . Pulmonary nodule seen on imaging study 05/20/2012  . 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 Cardiomyopathy, ischemic   . Hepatic steatosis   . Osteoporosis, post-menopausal   . 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 DM (diabetes mellitus), type 2, uncontrolled, with renal complications ([place]) [id number]  . Double vessel coronary artery disease 12/14/2011    Past Surgical History  Procedure Laterality Date  . Abdominal hysterectomy      at ge 39. secondary to bleeding  . Toe amputation  Sept 2012    Right 2nd toe, [place]  . Ptca  August 2012    Right Posterior tibial artery , [place]  . Colonoscopy with propofol N/A 01/09/2015    Procedure: COLONOSCOPY WITH PROPOFOL;  Surgeon: Lucilla Lame, MD;  Location: [facility] ENDOSCOPY;  Service: Endoscopy;  Laterality: N/A;  . Cholecystectomy    . Carpal tunnel release Bilateral   . Breast cyst aspiration Right   . Coronary angioplasty with stent placement      [place]; [facility]    Current Outpatient Rx  Name  Route  Sig  Dispense  Refill  . acetaminophen (TYLENOL) 325 MG tablet   Oral   Take 2 tablets (650 mg total) by mouth every 6 (six) hours as needed for mild pain (headache).   20 tablet   0   . albuterol (PROVENTIL) (2.5 MG/3ML) 0.083% nebulizer solution   Nebulization   Take 3 mLs (2.5 mg total) by  nebulization every 6 (six) hours as needed for wheezing or shortness of breath.   150 mL   1   . alendronate (FOSAMAX) 70 MG tablet   Oral   Take 1 tablet (70 mg total) by mouth every 7 (seven) days. Take with a full glass of water on an empty stomach.   4 tablet   11   . ALPRAZolam (XANAX) 0.5 MG tablet   Oral   Take 1 tablet (0.5 mg total) by mouth daily as needed for sleep or anxiety.   90 tablet   0   . aspirin 81 MG EC tablet   Oral   Take 1 tablet (81 mg total) by mouth daily. Swallow whole.   30 tablet   12   . benzonatate (TESSALON) 100 MG capsule      TAKE TWO CAPSULES BY MOUTH THREE TIMES DAILY AS NEEDED FOR COUGH   90 capsule   0   . BRILINTA 90 MG TABS tablet   Oral   Take 1 tablet (90 mg total) by mouth  2 (two) times daily.   180 tablet   2     Dispense as written.   . budesonide-formoterol (SYMBICORT) 160-4.5 MCG/ACT inhaler   Inhalation   Inhale 2 puffs into the lungs 2 (two) times daily.   1 Inhaler   0   . COMBIVENT RESPIMAT 20-100 MCG/ACT AERS respimat   Inhalation   Inhale 1 puff into the lungs 3 (three) times daily as needed.   2 Inhaler   3     Dispense as written.   . docusate sodium (COLACE) 100 MG capsule   Oral   Take 2 capsules (200 mg total) by mouth at bedtime.   60 capsule   3   . ergocalciferol (DRISDOL) 50000 units capsule   Oral   Take 1 capsule (50,000 Units total) by mouth once a week. With Sunday dinner   4 capsule   0   . fluticasone (FLONASE) 50 MCG/ACT nasal spray   Each Nare   Place 2 sprays into both nostrils daily.   16 g   2   . furosemide (LASIX) 20 MG tablet   Oral   Take 1 tablet (20 mg total) by mouth daily.   90 tablet   1   . insulin aspart protamine - aspart (NOVOLOG 70/30 MIX) (70-30) 100 UNIT/ML FlexPen      60  units before breakfast,  60 units before dinner.  NEEDS 15 PENS PER MONTH   105 mL   2   . isosorbide mononitrate (IMDUR) 30 MG 24 hr tablet      TAKE 1 TABLET DAILY   90  tablet   1   . losartan (COZAAR) 100 MG tablet   Oral   Take 1 tablet (100 mg total) by mouth daily.   90 tablet   2   . LYRICA 100 MG capsule      TAKE ONE CAPSULE BY MOUTH THREE TIMES DAILY   270 capsule   0   . metoprolol tartrate (LOPRESSOR) 12.5 mg TABS tablet   Oral   Take 12.5 mg by mouth 2 (two) times daily. Hold for SBP <110 and HR <60/min         . nitroGLYCERIN (NITROSTAT) 0.4 MG SL tablet   Sublingual   Place 1 tablet (0.4 mg total) under the tongue every 5 (five) minutes as needed for chest pain.   25 tablet   3   . PARoxetine (PAXIL) 20 MG tablet   Oral   Take 1 tablet (20 mg total) by mouth daily.   30 tablet   1   . polyethylene glycol powder (GLYCOLAX/MIRALAX) powder   Oral   Take 17 g by mouth daily.   3350 g   1   . rosuvastatin (CRESTOR) 20 MG tablet      TAKE 1 TABLET DAILY   90 tablet   1   . Spacer/Aero-Holding Chambers (AEROCHAMBER MV) inhaler      Use as instructed   1 each   0   . traMADol (ULTRAM) 50 MG tablet   Oral   Take 1 tablet (50 mg total) by mouth every 6 (six) hours as needed for moderate pain. Patient not taking: Reported on 09/26/2015   16 tablet   0     Allergies Contrast media; Gabapentin; and Sulfa antibiotics  Family History  Problem Relation Age of Onset  . Heart disease Father   . Heart attack Father   . Heart disease Sister   . Heart attack  Sister   . Heart disease Brother   . Heart attack Brother   . Asthma Grandchild   . Breast cancer Maternal Aunt     Social History Social History  Substance Use Topics  . Smoking status: Never Smoker   . Smokeless tobacco: Never Used  . Alcohol Use: No    Review of Systems  Constitutional: Negative for fever. Eyes: Negative for visual changes. ENT: Negative for sore throat. Cardiovascular: Mild chest pressure earlier this morning lasted only a few minutes and currently gone. No pleuritic chest pain. Respiratory: Negative for shortness of  breath. Gastrointestinal: Negative for abdominal pain, vomiting and diarrhea. Genitourinary: Negative for dysuria. Musculoskeletal: Negative for back pain. Skin: Negative for rash. Neurological: Negative for headache. 10 point Review of Systems otherwise negative ____________________________________________   PHYSICAL EXAM:  VITAL SIGNS: ED Triage Vitals  Enc Vitals Group     BP 09/28/15 1047 133/52 mmHg     Pulse Rate 09/28/15 1047 64     Resp 09/28/15 1047 20     Temp 09/28/15 1047 98 F (36.7 C)     Temp Source 09/28/15 1047 Oral     SpO2 09/28/15 1047 100 %     Weight 09/28/15 1047 190 lb (86.183 kg)     Height 09/28/15 1047 5\' 7"  (1.702 m)     Head Cir --      Peak Flow --      Pain Score 09/28/15 1047 5     Pain Loc --      Pain Edu? --      Excl. in [place]? --      Constitutional: Alert and oriented. Well appearing and in no distress. HEENT   Head: Normocephalic and atraumatic.      Eyes: Conjunctivae are normal. PERRL. Normal extraocular movements.      Ears:         Nose: No congestion/rhinnorhea.   Mouth/Throat: Mucous membranes are moist.   Neck: No stridor. Cardiovascular/Chest: Bradycardic, regular rhythm.  No murmurs, rubs, or gallops. Respiratory: Normal respiratory effort without tachypnea nor retractions. Breath sounds are clear and equal bilaterally. No wheezes/rales/rhonchi. Gastrointestinal: Soft. No distention, no guarding, no rebound. Nontender.   Genitourinary/rectal:Deferred Musculoskeletal: Nontender with normal range of motion in all extremities. No joint effusions.    No edema.  Calves tender bilaterally. Neurologic:  Normal speech and language. No gross or focal neurologic deficits are appreciated.  mild paresthesia to the shins and feet bilaterally, although patient is more complaining of pain rather than sensory changes. To tell that I'm touching on her lower extremity. There is no weakness. Skin:  Skin is warm, dry and intact. No rash  noted. Psychiatric: Mood and affect are normal. Speech and behavior are normal. Patient exhibits appropriate insight and judgment.  ____________________________________________   EKG I, Lisa Roca, MD, the attending physician have personally viewed and interpreted all ECGs.  65 bpm. Normal sinus rhythm with first-degree AV block. Left axis deviation. Right bundle branch block. LVH with re-pole changes. ____________________________________________  LABS (pertinent positives/negatives)  Labs Reviewed  CBC WITH DIFFERENTIAL/PLATELET - Abnormal; Notable for the following:    Hemoglobin 11.7 (*)    HCT 34.3 (*)    RDW 15.0 (*)    All other components within normal limits  COMPREHENSIVE METABOLIC PANEL - Abnormal; Notable for the following:    Glucose, Bld 183 (*)    BUN 34 (*)    Creatinine, Ser 1.72 (*)    GFR calc non Af Amer 28 (*)  GFR calc Af Amer 32 (*)    All other components within normal limits  GLUCOSE, CAPILLARY - Abnormal; Notable for the following:    Glucose-Capillary 157 (*)    All other components within normal limits  TROPONIN I     ____________________________________________  RADIOLOGY All Xrays were viewed by me. Imaging interpreted by Radiologist.  Ultrasound bilateral lower extremity on  No evidence of deep venous thrombosis.   __________________________________________  PROCEDURES  Procedure(s) performed: None  Critical Care performed: None  ____________________________________________   ED COURSE / ASSESSMENT AND PLAN  Pertinent labs & imaging results that were available during my care of the patient were reviewed by me and considered in my medical decision making (see chart for details).   Chief complaint on nursing note bed numbness, but for me the patient is more complaining about pain to her lower extremities. She does have a history of peripheral vascular disease, but there is no evidence of acute vascular compromise.  She is  complaining little bit of paresthesia, but she doesn't have a focal deficit, she is able to tell them touching her period and there is no weakness.  One thing I wanted to rule out was DVT, little unlikely in terms of the bilateral, however she was worried about this too with her primary care physician this week, and so ultrasounds were obtained.  There is no evidence of DVT.  The discretion of the pain really sounds like peripheral neuropathy. She also states that she occasionally has hand tingling as well consistent with stocking and glove peripheral neuropathy.  She is reporting a history of allergy to gabapentin. She is currently on Lyrica for her neuropathy.  I am to have her follow-up with a primary care physician and possibly even a neurologist for neuropathy.  No focal deficit to make me concerned about acute stroke. I think her symptoms are unlikely to be another peripheral nerve process such as Guillain-Barr, however I did warn her against any worsening or any new neurologic deficits including weakness to come back immediately.  She reports a short episode of chest pressure earlier this morning, and her EKG is essentially reassuring, with a troponin after greater than 4 hours that is negative.   CONSULTATIONS:   None   Patient / Family / Caregiver informed of clinical course, medical decision-making process, and agree with plan.   I discussed return precautions, follow-up instructions, and discharged instructions with patient and/or family.   ___________________________________________   FINAL CLINICAL IMPRESSION(S) / ED DIAGNOSES   Final diagnoses:  Leg pain, bilateral  Neuropathy ([place])              Note: This dictation was prepared with Dragon dictation. Any transcriptional errors that result from this process are unintentional   Lisa Roca, MD 09/28/15 [id number]

## 2015-09-28 NOTE — Telephone Encounter (Signed)
Notified [facility] of [facility] for [facility]AP supplies . Patient while on phone C/O numbness in legs and chest pain with pressure started this morning patient denied any swelling in legs and denied pain just numbness and tingling. Patient stated feels like something sitting on chest advised patient she should go to the ER for chest pain , patient advised nurse she would go now. FYI

## 2015-09-28 NOTE — ED Notes (Signed)
Food tray delivered

## 2015-09-28 NOTE — ED Notes (Signed)
[facility] services notified to bring tray.

## 2015-09-28 NOTE — ED Notes (Signed)
C/o numbness in both lower legs. Grips equal, no slurred speech.  +pulse and movement in bilat feet.

## 2015-10-01 ENCOUNTER — Telehealth: Payer: Self-pay | Admitting: *Deleted

## 2015-10-01 ENCOUNTER — Other Ambulatory Visit: Payer: Self-pay | Admitting: *Deleted

## 2015-10-01 DIAGNOSIS — E1121 Type 2 diabetes mellitus with diabetic nephropathy: Secondary | ICD-10-CM | POA: Diagnosis not present

## 2015-10-01 DIAGNOSIS — E1140 Type 2 diabetes mellitus with diabetic neuropathy, unspecified: Secondary | ICD-10-CM | POA: Diagnosis not present

## 2015-10-01 NOTE — Patient Outreach (Signed)
Attempt made to contact pt, f/u on referral from [facility] telephonic RN CM, recent ED visit.   Unable to leave a voice message as phone kept ringing.     Received a return phone call  shortly after leaving voice message from person identifying himself as pt's spouse.  Spouse reports pt is sleeping now.   RN CM provided spouse with contact name and number, requesting a return call tomorrow.   RN CM will wait for pt to call back.   Zara Chess.   [place] [facility]  [phone]

## 2015-10-01 NOTE — Telephone Encounter (Signed)
Patient called triage today to see if we could move up her 10-11-15 appts. We do not have any earlier appts at this time but I was going to offer to place her on our waiting/cancellation list. I tried to reach patient twice since her call at 11:00 am but the phone just rings, no one answers. Will continue to try and reach patient. According to [facility], patient was seen in the ED; ruled out DVT, told patient that pain seemed to be neuropathy related.

## 2015-10-02 ENCOUNTER — Other Ambulatory Visit: Payer: Self-pay | Admitting: *Deleted

## 2015-10-02 ENCOUNTER — Telehealth: Payer: Self-pay | Admitting: Internal Medicine

## 2015-10-02 DIAGNOSIS — B351 Tinea unguium: Secondary | ICD-10-CM | POA: Diagnosis not present

## 2015-10-02 DIAGNOSIS — D2370 Other benign neoplasm of skin of unspecified lower limb, including hip: Secondary | ICD-10-CM | POA: Diagnosis not present

## 2015-10-02 DIAGNOSIS — Z89422 Acquired absence of other left toe(s): Secondary | ICD-10-CM | POA: Diagnosis not present

## 2015-10-02 DIAGNOSIS — E1042 Type 1 diabetes mellitus with diabetic polyneuropathy: Secondary | ICD-10-CM | POA: Diagnosis not present

## 2015-10-02 NOTE — Patient Outreach (Signed)
Received a return phone call from pt to phone call placed yesterday by RN CM.   Discussed with pt recent ED visit 5/19 to which pt reports legs still stiff/achey when wake up in the morning, to f/u with Foot MD today.  Pt reports tried to get an earlier appointment with vein MD, called yesterday and have not heard back.   RN CM informed pt view in Epic (medical record)-  Note from MD office tried to call pt back  Twice (no answer) to let her know earlier appointment (original 6/1) is not available, to be put on waiting/cancellation list, will try and call pt back again.   Pt reports on CPAP supplies- talked to [facility] and was told did not get her $31 yet, needed before sending out supplies.  Pt reports she paid the $31 5/18 or 5/19.   Discussed with pt calling them again to see if received the $31, request send supplies as soon as possible as pt shared has not used CPAP in 3-4 days.  Discussed with pt referral for COPD disease management, schedule home visit to which pt reports MD sent out nurse- came yesterday to come again this week.  As agreed, home visit with RN CM scheduled for 5/31.      Zara Chess.   [place] [facility]  [phone]

## 2015-10-02 NOTE — Telephone Encounter (Signed)
Pt came in and dropped off her blood sugar readings.. Placed in Dr. Demetrios Isaacs box.Pamela Collier

## 2015-10-04 ENCOUNTER — Telehealth: Payer: Self-pay | Admitting: Internal Medicine

## 2015-10-04 DIAGNOSIS — E1121 Type 2 diabetes mellitus with diabetic nephropathy: Secondary | ICD-10-CM | POA: Diagnosis not present

## 2015-10-04 DIAGNOSIS — E1140 Type 2 diabetes mellitus with diabetic neuropathy, unspecified: Secondary | ICD-10-CM | POA: Diagnosis not present

## 2015-10-04 DIAGNOSIS — E1321 Other specified diabetes mellitus with diabetic nephropathy: Secondary | ICD-10-CM

## 2015-10-04 NOTE — Telephone Encounter (Signed)
Caller name: Lemuel[place] Relationship to patient: physical therapy Can be reached: [phone]  Reason for call: Lemuel with [facility] called to get a verbal order for pt for 1 day times 1wk then 2 days times 3 wks. Pt will be getting lower extremity training for fall prevention and home exercise. Pt will need rx for rollator walker with seat/wheels/brake. Please fax to [place] [phone].

## 2015-10-04 NOTE — Telephone Encounter (Signed)
In results folder. 

## 2015-10-05 DIAGNOSIS — E1121 Type 2 diabetes mellitus with diabetic nephropathy: Secondary | ICD-10-CM | POA: Diagnosis not present

## 2015-10-05 DIAGNOSIS — E1140 Type 2 diabetes mellitus with diabetic neuropathy, unspecified: Secondary | ICD-10-CM | POA: Diagnosis not present

## 2015-10-05 NOTE — Telephone Encounter (Signed)
Verbal given for PT and DME for walker waiting for sig: in blue folder.

## 2015-10-09 ENCOUNTER — Encounter: Payer: Self-pay | Admitting: Family

## 2015-10-10 ENCOUNTER — Other Ambulatory Visit: Payer: Self-pay | Admitting: *Deleted

## 2015-10-10 ENCOUNTER — Encounter: Payer: Self-pay | Admitting: *Deleted

## 2015-10-10 DIAGNOSIS — E1140 Type 2 diabetes mellitus with diabetic neuropathy, unspecified: Secondary | ICD-10-CM | POA: Diagnosis not present

## 2015-10-10 DIAGNOSIS — E1121 Type 2 diabetes mellitus with diabetic nephropathy: Secondary | ICD-10-CM | POA: Diagnosis not present

## 2015-10-10 MED ORDER — LOSARTAN POTASSIUM 100 MG PO TABS: 100.0000 mg | ORAL_TABLET | Freq: Every day | ORAL | Status: DC

## 2015-10-10 NOTE — Telephone Encounter (Signed)
Order faxed for walker.

## 2015-10-11 ENCOUNTER — Ambulatory Visit (INDEPENDENT_AMBULATORY_CARE_PROVIDER_SITE_OTHER): Payer: Medicare Other | Admitting: Family

## 2015-10-11 ENCOUNTER — Ambulatory Visit (HOSPITAL_COMMUNITY)
Admission: RE | Admit: 2015-10-11 | Discharge: 2015-10-11 | Disposition: A | Discharge status: Home / Self Care | Payer: Medicare Other | Source: Ambulatory Visit | Attending: Family | Admitting: Family

## 2015-10-11 ENCOUNTER — Encounter (HOSPITAL_COMMUNITY): Payer: Medicare Other

## 2015-10-11 ENCOUNTER — Ambulatory Visit: Payer: Medicare Other | Admitting: Family

## 2015-10-11 ENCOUNTER — Encounter: Payer: Self-pay | Admitting: Family

## 2015-10-11 VITALS — BP 98/52 | HR 60 | Temp 98.3°F | Resp 14 | Ht 67.0 in | Wt 194.0 lb

## 2015-10-11 DIAGNOSIS — I2510 Atherosclerotic heart disease of native coronary artery without angina pectoris: Secondary | ICD-10-CM | POA: Insufficient documentation

## 2015-10-11 DIAGNOSIS — I739 Peripheral vascular disease, unspecified: Secondary | ICD-10-CM

## 2015-10-11 DIAGNOSIS — R0989 Other specified symptoms and signs involving the circulatory and respiratory systems: Secondary | ICD-10-CM | POA: Diagnosis present

## 2015-10-11 DIAGNOSIS — E1122 Type 2 diabetes mellitus with diabetic chronic kidney disease: Secondary | ICD-10-CM | POA: Insufficient documentation

## 2015-10-11 DIAGNOSIS — F419 Anxiety disorder, unspecified: Secondary | ICD-10-CM | POA: Insufficient documentation

## 2015-10-11 DIAGNOSIS — I5032 Chronic diastolic (congestive) heart failure: Secondary | ICD-10-CM | POA: Insufficient documentation

## 2015-10-11 DIAGNOSIS — I779 Disorder of arteries and arterioles, unspecified: Secondary | ICD-10-CM | POA: Diagnosis not present

## 2015-10-11 DIAGNOSIS — E785 Hyperlipidemia, unspecified: Secondary | ICD-10-CM | POA: Insufficient documentation

## 2015-10-11 DIAGNOSIS — I110 Hypertensive heart disease with heart failure: Secondary | ICD-10-CM | POA: Diagnosis not present

## 2015-10-11 DIAGNOSIS — F329 Major depressive disorder, single episode, unspecified: Secondary | ICD-10-CM | POA: Insufficient documentation

## 2015-10-11 NOTE — Patient Outreach (Signed)
[place] [facility]) Care Management   10/10/15 Home visit   VAYA GHERARDI Oct 28, 1940 [id number]  NISHKA SALINAS is an 75 y.o. female  Subjective:  Pt reports her CPAP supplies have not come yet, check for $31 was mailed out almost 2 weeks ago.   Pt reports called [facility] several times, informed did not receive her check.   Pt reports HH PT is coming twice a week, working on strength in legs, got a rx from MD for a walker with a seat.    Pt reports she was in the ED 2 weeks ago for numbness in legs.  Pt reports numbness is better, occurs when getting up in the morning, to f/u with vein MD tomorrow.  Pt  questions if coming from her diabetes, sugar was high during holiday- 204, pm ranges 179,180's, today down- 142.  Pt reports no problems with breathing/COPD.     Objective:   Filed Vitals:   10/10/15 1554  BP: 102/58  Pulse: 60  Resp: 20    ROS  Physical Exam  Constitutional: She is oriented to person, place, and time. She appears well-developed and well-nourished.  Cardiovascular: Regular rhythm and normal heart sounds.   HR 60   Respiratory: Effort normal and breath sounds normal.  GI: Soft. Bowel sounds are normal.  Musculoskeletal: Normal range of motion.  Neurological: She is alert and oriented to person, place, and time.  Skin: Skin is warm and dry.  Psychiatric: She has a normal mood and affect. Her behavior is normal. Judgment and thought content normal.    Encounter Medications:   Outpatient Encounter Prescriptions as of 10/10/2015  Medication Sig Note  . acetaminophen (TYLENOL) 325 MG tablet Take 2 tablets (650 mg total) by mouth every 6 (six) hours as needed for mild pain (headache).   [name] albuterol (PROVENTIL) (2.5 MG/3ML) 0.083% nebulizer solution Take 3 mLs (2.5 mg total) by nebulization every 6 (six) hours as needed for wheezing or shortness of breath.   [name] alendronate (FOSAMAX) 70 MG tablet Take 1 tablet (70 mg total) by mouth every 7 (seven)  days. Take with a full glass of water on an empty stomach.   . ALPRAZolam (XANAX) 0.5 MG tablet Take 1 tablet (0.5 mg total) by mouth daily as needed for sleep or anxiety.   [name] aspirin 81 MG EC tablet Take 1 tablet (81 mg total) by mouth daily. Swallow whole.   . benzonatate (TESSALON) 100 MG capsule TAKE TWO CAPSULES BY MOUTH THREE TIMES DAILY AS NEEDED FOR COUGH 10/10/2015: As needed.   [name] BRILINTA 90 MG TABS tablet Take 1 tablet (90 mg total) by mouth 2 (two) times daily.   . budesonide-formoterol (SYMBICORT) 160-4.5 MCG/ACT inhaler Inhale 2 puffs into the lungs 2 (two) times daily.   . COMBIVENT RESPIMAT 20-100 MCG/ACT AERS respimat Inhale 1 puff into the lungs 3 (three) times daily as needed.   . fluticasone (FLONASE) 50 MCG/ACT nasal spray Place 2 sprays into both nostrils daily.   . furosemide (LASIX) 20 MG tablet Take 1 tablet (20 mg total) by mouth daily.   . insulin aspart protamine - aspart (NOVOLOG 70/30 MIX) (70-30) 100 UNIT/ML FlexPen 60 units before breakfast,  60 units before dinner.  NEEDS 15 PENS PER MONTH   . isosorbide mononitrate (IMDUR) 30 MG 24 hr tablet TAKE 1 TABLET DAILY   . losartan (COZAAR) 100 MG tablet Take 1 tablet (100 mg total) by mouth daily.   [name] LYRICA 100 MG  capsule TAKE ONE CAPSULE BY MOUTH THREE TIMES DAILY   . metoprolol tartrate (LOPRESSOR) 12.5 mg TABS tablet Take 12.5 mg by mouth 2 (two) times daily. Hold for SBP <110 and HR <60/min   . PARoxetine (PAXIL) 20 MG tablet Take 1 tablet (20 mg total) by mouth daily.   . polyethylene glycol powder (GLYCOLAX/MIRALAX) powder Take 17 g by mouth daily.   . rosuvastatin (CRESTOR) 20 MG tablet TAKE 1 TABLET DAILY   . Spacer/Aero-Holding Chambers (AEROCHAMBER MV) inhaler Use as instructed   . traMADol (ULTRAM) 50 MG tablet Take 1 tablet (50 mg total) by mouth every 6 (six) hours as needed for moderate pain.   [name] docusate sodium (COLACE) 100 MG capsule Take 2 capsules (200 mg total) by mouth at bedtime. (Patient not taking:  Reported on 10/10/2015)   . ergocalciferol (DRISDOL) 50000 units capsule Take 1 capsule (50,000 Units total) by mouth once a week. With Sunday dinner (Patient not taking: Reported on 10/10/2015)   . nitroGLYCERIN (NITROSTAT) 0.4 MG SL tablet Place 1 tablet (0.4 mg total) under the tongue every 5 (five) minutes as needed for chest pain. (Patient not taking: Reported on 10/10/2015) 10/10/2015: Available if needed    Facility-Administered Encounter Medications as of 10/10/2015  Medication  . albuterol (PROVENTIL) (5 MG/ML) 0.5% nebulizer solution 2.5 mg    Functional Status:   In your present state of health, do you have any difficulty performing the following activities: 10/10/2015 07/16/2015  Hearing? N N  Vision? N N  Difficulty concentrating or making decisions? N N  Walking or climbing stairs? Y Y  Dressing or bathing? N N  Doing errands, shopping? Y N  Preparing Food and eating ? Y Y  Using the Toilet? N N  In the past six months, have you accidently leaked urine? N N  Do you have problems with loss of bowel control? N N  Managing your Medications? N N  Managing your Finances? N N  Housekeeping or managing your Housekeeping? N N    Fall/Depression Screening:    PHQ 2/9 Scores 10/10/2015 10/30/2014 10/30/2014 10/20/2014 07/23/2014 11/22/2013 08/03/2013  PHQ - 2 Score 1 0 0 3 1 2  0  PHQ- 9 Score - - - 5 - 3 -  Exception Documentation - - - - Other- indicate reason in comment box - -    Assessment:   Pleasant 75 year old woman, resides with spouse (his caregiver).                             COPD- stable. No complaints                          CPAP supplies-  RN CM was able to get direct number for [facility] ([facility] supplies),attempt  Call made.  Pt also called during home visit, successful, was told received check- supplies  To be delivered in 5-7 days.                           DM- sugar today 142, per pt been elevated during holiday, depend on what she eats.                                   RN CM provided diabetic diet information.   Plan:  Pt to f/u with vein MD tomorrow - numbness in legs             Pt to continue to check sugars/record, work on compliance with diabetic diet.              RN CM to inform Dr. Derrel Nip of [facility] involvement, send barrier letter by in basket as well as route h/v encounter.             RN CM to continue to provide community nurse case management services (short term 3 months or less), next                  Home visit  6/30.   THN CM Care Plan Problem One        Most Recent Value   Care Plan Problem One  DM- recent elevated sugars    Role Documenting the Problem One  Care Management Coordinator   Care Plan for Problem One  Active   THN Long Term Goal (31-90 days)  Pt's sugars would be under 200 in the next 40 days    THN Long Term Goal Start Date  10/10/15   Interventions for Problem One Long Term Goal  Provided pt with "What's on my plate", information reviewed.    THN CM Short Term Goal #1 (0-30 days)  Pt would manage portion sizes in the next 30 days    THN CM Short Term Goal #1 Start Date  10/10/15   Interventions for Short Term Goal #1  discussed with pt portion sizes, more vegetables than meat, grain.      THN CM Care Plan Problem Two        Most Recent Value   Care Plan Problem Two  Issues with getting CPAP supplies    Role Documenting the Problem Two  Care Management Coordinator   Care Plan for Problem Two  Active   THN CM Short Term Goal #1 (0-30 days)  Pt would receive CPAP supplies within the next 7 days    THN CM Short Term Goal #1 Start Date  10/10/15   Interventions for Short Term Goal #2   RN CM attempted to call [facility] for pt for CPAP supplies, pt also called,successful- was told supplies would be delivered in 5-7 days       Jammi Morrissette M.   [place] [facility]  [phone]

## 2015-10-11 NOTE — Patient Instructions (Addendum)
Peripheral Vascular Disease Peripheral vascular disease (PVD) is a disease of the blood vessels that are not part of your heart and brain. A simple term for PVD is poor circulation. In most cases, PVD narrows the blood vessels that carry blood from your heart to the rest of your body. This can result in a decreased supply of blood to your arms, legs, and internal organs, like your stomach or kidneys. However, it most often affects a person's lower legs and feet. There are two types of PVD.  Organic PVD. This is the more common type. It is caused by damage to the structure of blood vessels.  Functional PVD. This is caused by conditions that make blood vessels contract and tighten (spasm). Without treatment, PVD tends to get worse over time. PVD can also lead to acute ischemic limb. This is when an arm or limb suddenly has trouble getting enough blood. This is a medical emergency. CAUSES Each type of PVD has many different causes. The most common cause of PVD is buildup of a fatty material (plaque) inside of your arteries (atherosclerosis). Small amounts of plaque can break off from the walls of the blood vessels and become lodged in a smaller artery. This blocks blood flow and can cause acute ischemic limb. Other common causes of PVD include:  Blood clots that form inside of blood vessels.  Injuries to blood vessels.  Diseases that cause inflammation of blood vessels or cause blood vessel spasms.  Health behaviors and health history that increase your risk of developing PVD. RISK FACTORS  You may have a greater risk of PVD if you:  Have a family history of PVD.  Have certain medical conditions, including:  High cholesterol.  Diabetes.  High blood pressure (hypertension).  Coronary heart disease.  Past problems with blood clots.  Past injury, such as burns or a broken bone. These may have damaged blood vessels in your limbs.  Buerger disease. This is caused by inflamed blood  vessels in your hands and feet.  Some forms of arthritis.  Rare birth defects that affect the arteries in your legs.  Use tobacco.  Do not get enough exercise.  Are obese.  Are age 50 or older. SIGNS AND SYMPTOMS  PVD may cause many different symptoms. Your symptoms depend on what part of your body is not getting enough blood. Some common signs and symptoms include:  Cramps in your lower legs. This may be a symptom of poor leg circulation (claudication).  Pain and weakness in your legs while you are physically active that goes away when you rest (intermittent claudication).  Leg pain when at rest.  Leg numbness, tingling, or weakness.  Coldness in a leg or foot, especially when compared with the other leg.  Skin or hair changes. These can include:  Hair loss.  Shiny skin.  Pale or bluish skin.  Thick toenails.  Inability to get or maintain an erection (erectile dysfunction). People with PVD are more prone to developing ulcers and sores on their toes, feet, or legs. These may take longer than normal to heal. DIAGNOSIS Your health care provider may diagnose PVD from your signs and symptoms. The health care provider will also do a physical exam. You may have tests to find out what is causing your PVD and determine its severity. Tests may include:  Blood pressure recordings from your arms and legs and measurements of the strength of your pulses (pulse volume recordings).  Imaging studies using sound waves to take pictures of   the blood flow through your blood vessels (Doppler ultrasound).  Injecting a dye into your blood vessels before having imaging studies using:  X-rays (angiogram or arteriogram).  Computer-generated X-rays (CT angiogram).  A powerful electromagnetic field and a computer (magnetic resonance angiogram or MRA). TREATMENT Treatment for PVD depends on the cause of your condition and the severity of your symptoms. It also depends on your age. Underlying  causes need to be treated and controlled. These include long-lasting (chronic) conditions, such as diabetes, high cholesterol, and high blood pressure. You may need to first try making lifestyle changes and taking medicines. Surgery may be needed if these do not work. Lifestyle changes may include:  Quitting smoking.  Exercising regularly.  Following a low-fat, low-cholesterol diet. Medicines may include:  Blood thinners to prevent blood clots.  Medicines to improve blood flow.  Medicines to improve your blood cholesterol levels. Surgical procedures may include:  A procedure that uses an inflated balloon to open a blocked artery and improve blood flow (angioplasty).  A procedure to put in a tube (stent) to keep a blocked artery open (stent implant).  Surgery to reroute blood flow around a blocked artery (peripheral bypass surgery).  Surgery to remove dead tissue from an infected wound on the affected limb.  Amputation. This is surgical removal of the affected limb. This may be necessary in cases of acute ischemic limb that are not improved through medical or surgical treatments. HOME CARE INSTRUCTIONS  Take medicines only as directed by your health care provider.  Do not use any tobacco products, including cigarettes, chewing tobacco, or electronic cigarettes. If you need help quitting, ask your health care provider.  Lose weight if you are overweight, and maintain a healthy weight as directed by your health care provider.  Eat a diet that is low in fat and cholesterol. If you need help, ask your health care provider.  Exercise regularly. Ask your health care provider to suggest some good activities for you.  Use compression stockings or other mechanical devices as directed by your health care provider.  Take good care of your feet.  Wear comfortable shoes that fit well.  Check your feet often for any cuts or sores. SEEK MEDICAL CARE IF:  You have cramps in your legs  while walking.  You have leg pain when you are at rest.  You have coldness in a leg or foot.  Your skin changes.  You have erectile dysfunction.  You have cuts or sores on your feet that are not healing. SEEK IMMEDIATE MEDICAL CARE IF:  Your arm or leg turns cold and blue.  Your arms or legs become red, warm, swollen, painful, or numb.  You have chest pain or trouble breathing.  You suddenly have weakness in your face, arm, or leg.  You become very confused or lose the ability to speak.  You suddenly have a very bad headache or lose your vision.   This information is not intended to replace advice given to you by your health care provider. Make sure you discuss any questions you have with your health care provider.   Document Released: 06/05/2004 Document Revised: 05/19/2014 Document Reviewed: 10/06/2013 Elsevier Interactive Patient Education 2016 [employer].   Natural, plant based non calorie sweetener: Stevia

## 2015-10-11 NOTE — Progress Notes (Signed)
[facility] [place]   CC: Follow up peripheral artery occlusive disease  History of Present Illness Pamela Collier is a 75 y.o. female patient of Dr. Oneida Alar who presents for evaluation of lower extremity peripheral arterial disease. The patient previously had an angioplasty of her right posterior tibial artery in 2012 by Dr. Lucky Cowboy in [place]. The patient had a contrast reaction with itching at that time and decided not to go back. She did have a toe amputation around that time as well. She denies any claudication symptoms. She does have pain in her calves and feet but this is present at rest or with walking. She also has bilateral numbness in her feet. She has had diabetes for greater than 40 years. She has neuropathy and takes Lyrica for this. She is only able to walk about 2 blocks before she becomes short of breath. She does not describe rest pain in the feet. She does not have any recent history of nonhealing wounds. Other medical problems include coronary artery disease, cardiomyopathy last EF 35-40%, hepatic steatosis, hypertension hyperlipidemia sarcoid and renal insufficiency (serum creatinine 1.5 02/2015) all of which are currently stable.  She reports issues with falling, had rehab with physical therapy for gait strengthening.  She is now receiving home physical therapy. Pt denies any hx of stroke, is surprised that cerebral infarct is listed in her PMHX from April 2015. Pt states her mother also has a head tremor.  Pt Diabetic: Yes, A1C on 09-26-15 was 9.6 (review of records)   Pt smoker: non-smoker  Pt meds include: Statin :Yes Betablocker: Yes ASA: Yes Other anticoagulants/antiplatelets: Brilinta   Past Medical History  Diagnosis Date  . Coronary artery disease, occlusive     a. Previous PCI to the LAD, LCx, and RCA in 2010, 2011, 2013, and 2016. All performed in [place].  . Cardiomyopathy, ischemic     a. EF 35 to 40% by echo in 2013 b. EF improved to  50-55% by echo in 04/2015.  [name] Hepatic steatosis     by CT abd pelvis  . Osteoporosis, post-menopausal   . 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 Atherosclerotic peripheral vascular disease with gangrene [facility]) august 2012  . Hypertensive heart disease   . Sarcoidosis ([place])   . Chronic diastolic CHF (congestive heart failure) (HCC)     a. EF 50-55% by echo in 04/2015  . Renal insufficiency   . Pleural effusion 10/25/2012    10/2012 CT chest >> small to moderate R lung effusion>> chylothorax, 100% lymphs 10/2013 thoracentesis> cytology negative, WBC 1471, > 90% "small lymphs" 01/2014 CT chest> near complete resolution of pleural effusion, stable lymphadenopathy 08/2014 CT chest [place] ([facility]): small right sided effusion decreased in size, stable mediastinal lymphadenopathy 1.0cm largest, 3m  . 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 Acute respiratory failure ([place]) 11/21/2014  . Diabetic diarrhea ([place]) 10/03/2014  . DM type 2, uncontrolled, with renal complications ([place]) [id number]  . Cerebral infarct ([place]) 08/17/2013  . Arthritis 08/05/2013  . 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 Hyperlipidemia LDL goal <100 02/23/2014  . COPD (chronic obstructive  pulmonary disease) [facility])     Social History Social History  Substance Use Topics  . Smoking status: Never Smoker   . Smokeless tobacco: Never Used  . Alcohol Use: No    Family History Family History  Problem Relation Age of Onset  . Heart disease Father   . Heart attack Father   . Heart disease Sister   . Heart attack Sister   . Heart disease Brother   . Heart attack Brother   . Asthma Grandchild   .  Breast cancer Maternal Aunt     Past Surgical History  Procedure Laterality Date  . Abdominal hysterectomy      at ge 39. secondary to bleeding  . Toe amputation  Sept 2012    Right 2nd toe, [place]  . Ptca  August 2012    Right Posterior tibial artery , [place]  . Colonoscopy with propofol N/A 01/09/2015    Procedure: COLONOSCOPY WITH PROPOFOL;  Surgeon: Lucilla Lame, MD;  Location: [facility] ENDOSCOPY;  Service: Endoscopy;  Laterality: N/A;  . Cholecystectomy    . Carpal tunnel release Bilateral   . Breast cyst aspiration Right   . Coronary angioplasty with stent placement      [place]; [facility]    Allergies  Allergen Reactions  . Contrast Media [Iodinated Diagnostic Agents] Itching  . Gabapentin Itching  . Sulfa Antibiotics Itching    Current Outpatient Prescriptions  Medication Sig Dispense Refill  . acetaminophen (TYLENOL) 325 MG tablet Take 2 tablets (650 mg total) by mouth every 6 (six) hours as needed for mild pain (headache). 20 tablet 0  . albuterol (PROVENTIL) (2.5 MG/3ML) 0.083% nebulizer solution Take 3 mLs (2.5 mg total) by nebulization every 6 (six) hours as needed for wheezing or shortness of breath. 150 mL 1  . alendronate (FOSAMAX) 70 MG tablet Take 1 tablet (70 mg total) by mouth every 7 (seven) days. Take with a full glass of water on an empty stomach. 4 tablet 11  . ALPRAZolam (XANAX) 0.5 MG tablet Take 1 tablet (0.5 mg total) by mouth daily as needed for sleep or anxiety. 90 tablet 0  . aspirin 81 MG EC tablet Take 1 tablet (81 mg total) by mouth daily. Swallow whole. 30 tablet 12  . benzonatate (TESSALON) 100 MG capsule TAKE TWO CAPSULES BY MOUTH THREE TIMES DAILY AS NEEDED FOR COUGH 90 capsule 0  . BRILINTA 90 MG TABS tablet Take 1 tablet (90 mg total) by mouth 2 (two) times daily. 180 tablet 2  . budesonide-formoterol (SYMBICORT) 160-4.5 MCG/ACT inhaler Inhale 2 puffs into the lungs 2 (two) times daily. 1 Inhaler 0  . COMBIVENT RESPIMAT  20-100 MCG/ACT AERS respimat Inhale 1 puff into the lungs 3 (three) times daily as needed. 2 Inhaler 3  . docusate sodium (COLACE) 100 MG capsule Take 2 capsules (200 mg total) by mouth at bedtime. (Patient not taking: Reported on 10/10/2015) 60 capsule 3  . ergocalciferol (DRISDOL) 50000 units capsule Take 1 capsule (50,000 Units total) by mouth once a week. With Sunday dinner (Patient not taking: Reported on 10/10/2015) 4 capsule 0  . fluticasone (FLONASE) 50 MCG/ACT nasal spray Place 2 sprays into both nostrils daily. 16 g 2  . furosemide (LASIX) 20 MG tablet Take 1 tablet (20 mg total) by mouth daily. 90 tablet 1  . insulin aspart protamine - aspart (NOVOLOG 70/30 MIX) (70-30) 100 UNIT/ML FlexPen 60 units before breakfast,  60 units before dinner.  NEEDS 15 PENS PER  MONTH 105 mL 2  . isosorbide mononitrate (IMDUR) 30 MG 24 hr tablet TAKE 1 TABLET DAILY 90 tablet 1  . losartan (COZAAR) 100 MG tablet Take 1 tablet (100 mg total) by mouth daily. 90 tablet 0  . LYRICA 100 MG capsule TAKE ONE CAPSULE BY MOUTH THREE TIMES DAILY 270 capsule 0  . metoprolol tartrate (LOPRESSOR) 12.5 mg TABS tablet Take 12.5 mg by mouth 2 (two) times daily. Hold for SBP <110 and HR <60/min    . nitroGLYCERIN (NITROSTAT) 0.4 MG SL tablet Place 1 tablet (0.4 mg total) under the tongue every 5 (five) minutes as needed for chest pain. (Patient not taking: Reported on 10/10/2015) 25 tablet 3  . PARoxetine (PAXIL) 20 MG tablet Take 1 tablet (20 mg total) by mouth daily. 30 tablet 1  . polyethylene glycol powder (GLYCOLAX/MIRALAX) powder Take 17 g by mouth daily. 3350 g 1  . rosuvastatin (CRESTOR) 20 MG tablet TAKE 1 TABLET DAILY 90 tablet 1  . Spacer/Aero-Holding Chambers (AEROCHAMBER MV) inhaler Use as instructed 1 each 0  . traMADol (ULTRAM) 50 MG tablet Take 1 tablet (50 mg total) by mouth every 6 (six) hours as needed for moderate pain. 16 tablet 0   No current facility-administered medications for this visit.    Facility-Administered Medications Ordered in Other Visits  Medication Dose Route Frequency Provider Last Rate Last Dose  . albuterol (PROVENTIL) (5 MG/ML) 0.5% nebulizer solution 2.5 mg  2.5 mg Nebulization Once [name], MD        ROS: See HPI for pertinent positives and negatives.   Physical Examination  Filed Vitals:   10/11/15 1114  BP: 98/52  Pulse: 60  Temp: 98.3 F (36.8 C)  TempSrc: Oral  Resp: 14  Height: 5\' 7"  (1.702 m)  Weight: 194 lb (87.998 kg)  SpO2: 98%   Body mass index is 30.38 kg/(m^2).  General: A&O x 3, WDWN, obese female. Gait: slow and deliberate, using cane Eyes: PERRLA. Pulmonary: Respirations are non labored, CTAB, without wheezes , rales or rhonchi. Cardiac: regular rhythm, no detected murmur.         Carotid Bruits Right Left   Negative negative  Aorta is not palpable. Radial pulses: 2+ palaple and =                           VASCULAR EXAM: Extremities without ischemic changes, without Gangrene; without open wounds. Right second toe is surgically absent.                                                                                                          LE Pulses Right Left       FEMORAL   palpable   palpable        POPLITEAL  not palpable   not palpable       POSTERIOR TIBIAL  faintly palpable   faintly palpable        DORSALIS PEDIS      ANTERIOR TIBIAL not palpable  not palpable  Abdomen: soft, NT, no palpable masses. Skin: no rashes, no ulcers noted. Musculoskeletal: no muscle wasting or atrophy.  Neurologic: A&O X 3; Appropriate Affect ; SENSATION: normal; MOTOR FUNCTION:  moving all extremities equally, motor strength 4/5 throughout. Speech is halting and fluid. CN 2-12 intact. Mild resting head tremor.    Non-Invasive Vascular Imaging: DATE: 10/11/2015 ABI: RIGHT: 1.3 (1.46, 04/12/15), Waveforms: triphasic PT, monophasic DP, TBI: 0.80;  LEFT: 1.07 (1.22), Waveforms: biphasic PT, monophasic DP, TBI:  0.76   ASSESSMENT: Pamela Collier is a 75 y.o. female who is s/p angioplasty of her right posterior tibial artery in 2012 by Dr. Lucky Cowboy in [place]. The patient had a contrast reaction with itching at that time and decided not to go back. She did have a toe amputation around that time as well. She denies any claudication symptoms. She does have pain in her calves and feet but this is present at rest or with walking. She also has bilateral numbness in her feet. She has had diabetes for greater than 40 years. She has neuropathy and takes Lyrica for this. Her walking is limited by her dyspnea. There are no signs of ischemia in her feet/legs. ABI's today indicate normal bilateral arterial perfusion with tri, bi, and monophasic waveforms.  Her primary atherosclerotic risk factor is long term DM, currently uncontrolled. Fortunately she has never used tobacco. She takes Brilinta, ASA, and a statin.   PLAN:  Based on the patient's vascular studies and examination, pt will return to clinic in 1 year with ABI's, sooner if she develops concerns re the circulation in her legs.  I discussed in depth with the patient the nature of atherosclerosis, and emphasized the importance of maximal medical management including strict control of blood pressure, blood glucose, and lipid levels, obtaining regular exercise, and continued cessation of smoking.  The patient is aware that without maximal medical management the underlying atherosclerotic disease process will progress, limiting the benefit of any interventions.  The patient was given information about PAD including signs, symptoms, treatment, what symptoms should prompt the patient to seek immediate medical care, and risk reduction measures to take.  Clemon Chambers, RN, MSN, FNP-C [facility] [employer] Phone: [phone]  Clinic MD: [facility]  10/11/2015 11:34 AM

## 2015-10-12 DIAGNOSIS — E1121 Type 2 diabetes mellitus with diabetic nephropathy: Secondary | ICD-10-CM | POA: Diagnosis not present

## 2015-10-12 DIAGNOSIS — E1140 Type 2 diabetes mellitus with diabetic neuropathy, unspecified: Secondary | ICD-10-CM | POA: Diagnosis not present

## 2015-10-15 ENCOUNTER — Telehealth: Payer: Self-pay | Admitting: *Deleted

## 2015-10-15 DIAGNOSIS — I255 Ischemic cardiomyopathy: Secondary | ICD-10-CM

## 2015-10-15 MED ORDER — FUROSEMIDE 20 MG PO TABS: 20.0000 mg | ORAL_TABLET | Freq: Every day | ORAL | Status: DC

## 2015-10-15 MED ORDER — METOPROLOL TARTRATE 25 MG PO TABS: 12.5000 mg | ORAL_TABLET | Freq: Two times a day (BID) | ORAL | Status: DC

## 2015-10-15 MED ORDER — LOSARTAN POTASSIUM 100 MG PO TABS: 100.0000 mg | ORAL_TABLET | Freq: Every day | ORAL | Status: DC

## 2015-10-15 NOTE — Telephone Encounter (Signed)
Spoke with [name], refilled the medications, she is also concerned that Pamela Collier is not up to where she should be , they are extending her for two more weeks of visits. thanks

## 2015-10-15 NOTE — Telephone Encounter (Signed)
Pamela Collier from [facility] has requested medication refill for metoprolol, losartan and furosemide.  She also requested a call from the nurse, to discuss patient care.  [name] [phone]

## 2015-10-16 ENCOUNTER — Other Ambulatory Visit: Payer: Self-pay | Admitting: *Deleted

## 2015-10-16 DIAGNOSIS — I255 Ischemic cardiomyopathy: Secondary | ICD-10-CM

## 2015-10-16 DIAGNOSIS — E1140 Type 2 diabetes mellitus with diabetic neuropathy, unspecified: Secondary | ICD-10-CM | POA: Diagnosis not present

## 2015-10-16 DIAGNOSIS — E1121 Type 2 diabetes mellitus with diabetic nephropathy: Secondary | ICD-10-CM | POA: Diagnosis not present

## 2015-10-16 MED ORDER — BRILINTA 90 MG PO TABS: 90.0000 mg | ORAL_TABLET | Freq: Two times a day (BID) | ORAL | Status: DC

## 2015-10-17 ENCOUNTER — Ambulatory Visit (INDEPENDENT_AMBULATORY_CARE_PROVIDER_SITE_OTHER): Payer: Medicare Other | Admitting: Internal Medicine

## 2015-10-17 ENCOUNTER — Encounter: Payer: Self-pay | Admitting: Internal Medicine

## 2015-10-17 VITALS — BP 128/68 | HR 71 | Wt 195.0 lb

## 2015-10-17 DIAGNOSIS — I779 Disorder of arteries and arterioles, unspecified: Secondary | ICD-10-CM

## 2015-10-17 DIAGNOSIS — D860 Sarcoidosis of lung: Secondary | ICD-10-CM | POA: Diagnosis not present

## 2015-10-17 MED ORDER — ALBUTEROL SULFATE HFA 108 (90 BASE) MCG/ACT IN AERS: 2.0000 | INHALATION_SPRAY | Freq: Four times a day (QID) | RESPIRATORY_TRACT | Status: DC | PRN

## 2015-10-17 NOTE — Patient Instructions (Signed)
--  Use CPAP every day.   --Stop symbicort, will start proair 2 puffs as needed.

## 2015-10-17 NOTE — Addendum Note (Signed)
Addended by: Maryanna Shape A on: 10/17/2015 11:47 AM   Modules accepted: Orders

## 2015-10-17 NOTE — Progress Notes (Signed)
* [facility]     Assessment and Plan:  Obstructive sleep apnea. --Continue using cpap every night.   Chronic debility and deconditioning.  --I suspect that this is the greatest contributor to her exertional dyspnea.  --Continue PT.   Chronic bronchitis.  --Currently using symicort inhaler prn, feels that it helps. Will change to albuterol inhaler 2 puffs prn. If not improved at next visit can consider starting spiriva.   Cough.  --Continue tessalon.   Pulmonary sarcoidosis. --Currently appears stable.   Chylous pleural effusion. -Recurrent, due to sarcoidosis, currently stable.   Date: 10/17/2015  MRN# [id number] Pamela Collier February 20, 1941   Pamela Collier is a 75 y.o. old female seen in follow up for chief complaint of  Chief Complaint  Patient presents with  . Follow-up    wears CPAP nightly, no concerns with that; SOB worse lately; prod cough w/thick, clear mucus; CP this am, achy feeling     HPI:   The patient is a 75 year old female with a history of pulmonary sarcoidosis diagnosed more than 20 years ago by a mediastinal biopsy. 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 She has a history of sleep apnea, and has been maintained on CPAP  Review of chest x-ray images from 09/12/15, bibasilar atelectasis, minimal pleural effusions, scattered interstitial scarring. She was in the [place] recently due to numbness in her body extending to her calves. She notes that her breathing is short with walking short distances. She does PT at home and notes that her breathing was worse this past week. Her sarcoidosis has not really given her any issues as far as she knows.  She lives at home with her husband but he is also is poorly functional.  She is using symbicort inhaler prn. She feels that it may help with her breathing.   She has not been using her cpap due to running out of supplies for 2 months,  she recently got them the supplies last week and has started using the machine again.   10/2012 CT chest >> small to moderate R lung effusion>> chylothorax, 100% lymphs 10/2013 thoracentesis> cytology negative, WBC 1471, > 90% "small lymphs" 01/2014 CT chest> near complete resolution of pleural effusion, stable lymphadenopathy 08/2014 CT chest [place] ([facility]): small right sided effusion decreased in size, stable mediastinal lymphadenopathy 1.0cm largest, 67m  O2 sat at RA at rest was 99% and HR 60. Walked 300 feet at slow rate with cane. Sat was 97% and HR 68 with no dyspnea.  Download of CPAP data today shows compliance of 3 days only. She is on an auto set. Avg ahi is 5; 95th percentile pressure is 12.   Medication:   Outpatient Encounter Prescriptions as of 10/17/2015  Medication Sig  . acetaminophen (TYLENOL) 325 MG tablet Take 2 tablets (650 mg total) by mouth every 6 (six) hours as needed for mild pain (headache).  [name] albuterol (PROVENTIL) (2.5 MG/3ML) 0.083% nebulizer solution Take 3 mLs (2.5 mg total) by nebulization every 6 (six) hours as needed for wheezing or shortness of breath.  [name] alendronate (FOSAMAX) 70 MG tablet Take 1 tablet (70 mg total) by mouth every 7 (seven) days. Take with a full glass of water on an empty stomach.  . ALPRAZolam (XANAX) 0.5 MG tablet Take 1 tablet (0.5 mg total) by mouth daily as needed for sleep or anxiety.  [name] aspirin 81 MG EC tablet  Take 1 tablet (81 mg total) by mouth daily. Swallow whole.  . benzonatate (TESSALON) 100 MG capsule TAKE TWO CAPSULES BY MOUTH THREE TIMES DAILY AS NEEDED FOR COUGH  . BRILINTA 90 MG TABS tablet Take 1 tablet (90 mg total) by mouth 2 (two) times daily.  . budesonide-formoterol (SYMBICORT) 160-4.5 MCG/ACT inhaler Inhale 2 puffs into the lungs 2 (two) times daily.  . COMBIVENT RESPIMAT 20-100 MCG/ACT AERS respimat Inhale 1 puff into the lungs 3 (three) times daily as needed.  . docusate sodium (COLACE) 100  MG capsule Take 2 capsules (200 mg total) by mouth at bedtime. (Patient not taking: Reported on 10/10/2015)  . ergocalciferol (DRISDOL) 50000 units capsule Take 1 capsule (50,000 Units total) by mouth once a week. With Sunday dinner (Patient not taking: Reported on 10/10/2015)  . fluticasone (FLONASE) 50 MCG/ACT nasal spray Place 2 sprays into both nostrils daily.  . furosemide (LASIX) 20 MG tablet Take 1 tablet (20 mg total) by mouth daily.  . insulin aspart protamine - aspart (NOVOLOG 70/30 MIX) (70-30) 100 UNIT/ML FlexPen 60 units before breakfast,  60 units before dinner.  NEEDS 15 PENS PER MONTH  . isosorbide mononitrate (IMDUR) 30 MG 24 hr tablet TAKE 1 TABLET DAILY  . losartan (COZAAR) 100 MG tablet Take 1 tablet (100 mg total) by mouth daily.  [name] LYRICA 100 MG capsule TAKE ONE CAPSULE BY MOUTH THREE TIMES DAILY  . metoprolol tartrate (LOPRESSOR) 12.5 mg TABS tablet Take 12.5 mg by mouth 2 (two) times daily. Hold for SBP <110 and HR <60/min  . metoprolol tartrate (LOPRESSOR) 25 MG tablet Take 0.5 tablets (12.5 mg total) by mouth 2 (two) times daily.  . nitroGLYCERIN (NITROSTAT) 0.4 MG SL tablet Place 1 tablet (0.4 mg total) under the tongue every 5 (five) minutes as needed for chest pain. (Patient not taking: Reported on 10/10/2015)  . PARoxetine (PAXIL) 20 MG tablet Take 1 tablet (20 mg total) by mouth daily.  . polyethylene glycol powder (GLYCOLAX/MIRALAX) powder Take 17 g by mouth daily.  . rosuvastatin (CRESTOR) 20 MG tablet TAKE 1 TABLET DAILY  . Spacer/Aero-Holding Chambers (AEROCHAMBER MV) inhaler Use as instructed  . traMADol (ULTRAM) 50 MG tablet Take 1 tablet (50 mg total) by mouth every 6 (six) hours as needed for moderate pain.   Facility-Administered Encounter Medications as of 10/17/2015  Medication  . albuterol (PROVENTIL) (5 MG/ML) 0.5% nebulizer solution 2.5 mg     Allergies:  Contrast media; Gabapentin; and Sulfa antibiotics  Review of Systems: Gen:  Denies  fever,  sweats. HEENT: Denies blurred vision. Cvc:  No dizziness, chest pain or heaviness Resp:   Denies cough or sputum porduction. Gi: Denies swallowing difficulty, stomach pain. constipation, bowel incontinence Gu:  Denies bladder incontinence, burning urine Ext:   No Joint pain, stiffness. Skin: No skin rash, easy bruising. Endoc:  No polyuria, polydipsia. Psych: No depression, insomnia. Other:  All other systems were reviewed and found to be negative other than what is mentioned in the HPI.   Physical Examination:   VS: BP 128/68 mmHg  Pulse 71  Wt 195 lb (88.451 kg)  SpO2 96%  General Appearance: No distress  Neuro:without focal findings,  speech normal,  HEENT: PERRLA, EOM intact. Pulmonary: normal breath sounds, No wheezing.   CardiovascularNormal S1,S2.  No m/r/g.   Abdomen: Benign, Soft, non-tender. Renal:  No costovertebral tenderness  GU:  Not performed at this time. Endoc: No evident thyromegaly, no signs of acromegaly. Skin:   warm, no rash.  Extremities: normal, no cyanosis, clubbing.   LABORATORY PANEL:   CBC No results for input(s): WBC, HGB, HCT, PLT in the last 168 hours. ------------------------------------------------------------------------------------------------------------------  Chemistries  No results for input(s): NA, K, CL, CO2, GLUCOSE, BUN, CREATININE, CALCIUM, MG, AST, ALT, ALKPHOS, BILITOT in the last 168 hours.  Invalid input(s): GFRCGP ------------------------------------------------------------------------------------------------------------------  Cardiac Enzymes No results for input(s): TROPONINI in the last 168 hours. ------------------------------------------------------------  RADIOLOGY:   No results found for this or any previous visit. Results for orders placed during the hospital encounter of 09/12/15  DG Chest 2 View   Narrative CLINICAL DATA:  Shortness of breath and chest pain. History of sarcoidosis  EXAM: CHEST  2  VIEW  COMPARISON:  Chest radiograph June 28, 2015 and chest CT July 11, 2015  FINDINGS: There is no edema or consolidation. Heart size and pulmonary vascularity are normal. No adenopathy. Calcified left mediastinal lymph nodes likely is secondary to sarcoidosis. No bone lesions. No pneumothorax.  IMPRESSION: Calcified lymph nodes, a finding likely secondary to prior sarcoidosis. No edema or consolidation.   Electronically Signed   By: Lowella Grip III M.D.   On: 09/12/2015 12:46    ------------------------------------------------------------------------------------------------------------------  Thank  you for allowing [facility] to assist in the care of your patient. Our recommendations are noted above.  Please contact us if we can be of further service.   Marda Stalker, MD.  [facility] Office Number: [phone]  Patricia Pesa, M.D.  Vilinda Boehringer, M.D.  Merton Border, M.D  10/17/2015

## 2015-10-18 ENCOUNTER — Telehealth: Payer: Self-pay | Admitting: Internal Medicine

## 2015-10-19 ENCOUNTER — Emergency Department: Payer: Medicare Other

## 2015-10-19 ENCOUNTER — Other Ambulatory Visit: Payer: Self-pay | Admitting: Internal Medicine

## 2015-10-19 ENCOUNTER — Emergency Department
Admission: EM | Admit: 2015-10-19 | Discharge: 2015-10-19 | Disposition: A | Discharge status: Home / Self Care | Payer: Medicare Other | Attending: Emergency Medicine | Admitting: Emergency Medicine

## 2015-10-19 ENCOUNTER — Encounter: Payer: Self-pay | Admitting: Emergency Medicine

## 2015-10-19 DIAGNOSIS — M1990 Unspecified osteoarthritis, unspecified site: Secondary | ICD-10-CM | POA: Insufficient documentation

## 2015-10-19 DIAGNOSIS — E785 Hyperlipidemia, unspecified: Secondary | ICD-10-CM | POA: Diagnosis not present

## 2015-10-19 DIAGNOSIS — Z8673 Personal history of transient ischemic attack (TIA), and cerebral infarction without residual deficits: Secondary | ICD-10-CM | POA: Insufficient documentation

## 2015-10-19 DIAGNOSIS — R42 Dizziness and giddiness: Secondary | ICD-10-CM

## 2015-10-19 DIAGNOSIS — Z952 Presence of prosthetic heart valve: Secondary | ICD-10-CM | POA: Insufficient documentation

## 2015-10-19 DIAGNOSIS — I255 Ischemic cardiomyopathy: Secondary | ICD-10-CM | POA: Diagnosis not present

## 2015-10-19 DIAGNOSIS — I5032 Chronic diastolic (congestive) heart failure: Secondary | ICD-10-CM | POA: Insufficient documentation

## 2015-10-19 DIAGNOSIS — I252 Old myocardial infarction: Secondary | ICD-10-CM | POA: Diagnosis not present

## 2015-10-19 DIAGNOSIS — I2510 Atherosclerotic heart disease of native coronary artery without angina pectoris: Secondary | ICD-10-CM | POA: Diagnosis not present

## 2015-10-19 DIAGNOSIS — M810 Age-related osteoporosis without current pathological fracture: Secondary | ICD-10-CM | POA: Insufficient documentation

## 2015-10-19 DIAGNOSIS — I110 Hypertensive heart disease with heart failure: Secondary | ICD-10-CM | POA: Diagnosis not present

## 2015-10-19 DIAGNOSIS — Z955 Presence of coronary angioplasty implant and graft: Secondary | ICD-10-CM | POA: Diagnosis not present

## 2015-10-19 DIAGNOSIS — F329 Major depressive disorder, single episode, unspecified: Secondary | ICD-10-CM | POA: Insufficient documentation

## 2015-10-19 DIAGNOSIS — J449 Chronic obstructive pulmonary disease, unspecified: Secondary | ICD-10-CM | POA: Insufficient documentation

## 2015-10-19 DIAGNOSIS — E1351 Other specified diabetes mellitus with diabetic peripheral angiopathy without gangrene: Secondary | ICD-10-CM | POA: Diagnosis not present

## 2015-10-19 DIAGNOSIS — E1121 Type 2 diabetes mellitus with diabetic nephropathy: Secondary | ICD-10-CM | POA: Diagnosis not present

## 2015-10-19 DIAGNOSIS — R109 Unspecified abdominal pain: Secondary | ICD-10-CM | POA: Diagnosis not present

## 2015-10-19 DIAGNOSIS — Z79899 Other long term (current) drug therapy: Secondary | ICD-10-CM | POA: Diagnosis not present

## 2015-10-19 DIAGNOSIS — E1140 Type 2 diabetes mellitus with diabetic neuropathy, unspecified: Secondary | ICD-10-CM | POA: Diagnosis not present

## 2015-10-19 LAB — CBC WITH DIFFERENTIAL/PLATELET
Basophils Absolute: 0 10*3/uL (ref 0–0.1)
Basophils Relative: 1 %
EOS PCT: 2 %
Eosinophils Absolute: 0.1 10*3/uL (ref 0–0.7)
HEMATOCRIT: 33.9 % — AB (ref 35.0–47.0)
HEMOGLOBIN: 11.3 g/dL — AB (ref 12.0–16.0)
LYMPHS ABS: 1.3 10*3/uL (ref 1.0–3.6)
LYMPHS PCT: 22 %
MCH: 30 pg (ref 26.0–34.0)
MCHC: 33.4 g/dL (ref 32.0–36.0)
MCV: 89.9 fL (ref 80.0–100.0)
Monocytes Absolute: 0.6 10*3/uL (ref 0.2–0.9)
Monocytes Relative: 11 %
NEUTROS ABS: 3.9 10*3/uL (ref 1.4–6.5)
NEUTROS PCT: 66 %
Platelets: 254 10*3/uL (ref 150–440)
RBC: 3.77 MIL/uL — AB (ref 3.80–5.20)
RDW: 14.8 % — ABNORMAL HIGH (ref 11.5–14.5)
WBC: 5.9 10*3/uL (ref 3.6–11.0)

## 2015-10-19 LAB — COMPREHENSIVE METABOLIC PANEL
ALT: 18 U/L (ref 14–54)
AST: 25 U/L (ref 15–41)
Albumin: 3.8 g/dL (ref 3.5–5.0)
Alkaline Phosphatase: 44 U/L (ref 38–126)
Anion gap: 7 (ref 5–15)
BILIRUBIN TOTAL: 0.8 mg/dL (ref 0.3–1.2)
BUN: 33 mg/dL — ABNORMAL HIGH (ref 6–20)
CALCIUM: 9.4 mg/dL (ref 8.9–10.3)
CHLORIDE: 106 mmol/L (ref 101–111)
CO2: 26 mmol/L (ref 22–32)
Creatinine, Ser: 1.91 mg/dL — ABNORMAL HIGH (ref 0.44–1.00)
GFR, EST AFRICAN AMERICAN: 28 mL/min — AB (ref >60)
GFR, EST NON AFRICAN AMERICAN: 25 mL/min — AB (ref >60)
GLUCOSE: 123 mg/dL — AB (ref 65–99)
POTASSIUM: 4.4 mmol/L (ref 3.5–5.1)
Sodium: 139 mmol/L (ref 135–145)
Total Protein: 7.5 g/dL (ref 6.5–8.1)

## 2015-10-19 LAB — URINALYSIS COMPLETE WITH MICROSCOPIC (ARMC ONLY)
BILIRUBIN URINE: NEGATIVE
GLUCOSE, UA: NEGATIVE mg/dL
Ketones, ur: NEGATIVE mg/dL
Nitrite: NEGATIVE
PH: 6 (ref 5.0–8.0)
Protein, ur: NEGATIVE mg/dL
SPECIFIC GRAVITY, URINE: 1.008 (ref 1.005–1.030)

## 2015-10-19 LAB — TROPONIN I: Troponin I: <0.03 ng/mL (ref <0.031)

## 2015-10-19 LAB — LACTIC ACID, PLASMA: Lactic Acid, Venous: 1 mmol/L (ref 0.5–2.0)

## 2015-10-19 MED ORDER — PREGABALIN 100 MG PO CAPS: 100.0000 mg | ORAL_CAPSULE | Freq: Three times a day (TID) | ORAL | Status: DC

## 2015-10-19 MED ORDER — METOPROLOL TARTRATE 25 MG PO TABS: 25.0000 mg | ORAL_TABLET | Freq: Two times a day (BID) | ORAL | Status: DC

## 2015-10-19 MED ORDER — TRAMADOL HCL 50 MG PO TABS: 50.0000 mg | ORAL_TABLET | Freq: Four times a day (QID) | ORAL | Status: DC | PRN

## 2015-10-19 NOTE — ED Notes (Signed)
Ambulated pt. In room.  Pt. States "I feel shaky"  Pt. Also states she feels numbness in lower extremities.  Pt. Was able to ambulate in room with cane.

## 2015-10-19 NOTE — Telephone Encounter (Addendum)
Furosemide sent to long term pharmacy on 10/15/15, Metoprolol is historical ok to fill? And need approval for lyrica.  Last fill on Lyrica 1/17 to [facility].

## 2015-10-19 NOTE — ED Notes (Signed)
Pt. Husband taking pt. home

## 2015-10-19 NOTE — Telephone Encounter (Addendum)
Patient stated that she did not receive her metoprolol, furosemide . She requested to have lyrical refilled as well.

## 2015-10-19 NOTE — ED Notes (Addendum)
Pt arrived by EMS from home with c/o dizziness. Pt states it is sudden onset when she stands. This morning she stood up with assistance from homecare nurse and "almost fell". Pt states she has had off/on numbness/tingling in her arms and pain in her right arm.  Pt recently diagnosed with bronchitis but has not start medication prescribed.

## 2015-10-19 NOTE — Telephone Encounter (Signed)
lyrica and metoprolol ok to refill but where to send?

## 2015-10-19 NOTE — Telephone Encounter (Signed)
[facility]

## 2015-10-19 NOTE — ED Provider Notes (Signed)
[facility] Emergency Department Provider Note   ____________________________________________  Time seen: Approximately 2:58 PM  I have reviewed the triage vital signs and the nursing notes.   HISTORY  Chief Complaint   HPI Pamela Collier is a 75y.o. female patient reports she was seen by her primary care doctor's office on Tuesday and treated for bronchitis. She has not yet gotten the medicine. She said today however she stood up and got very very lightheaded and almost fell. She was not spinning at all. Her had lightheadedness like this in the past. She is not really short of breath. She does not have a fever she is not having nausea vomiting or diarrhea and nothing really hurts except for some left lower flank pain that has been coming and going for 2 months. The lightheadedness is severe. Only brought on by standing up. The pain is moderate in nature and comes and goes whenever it wants to.  Past Medical History  Diagnosis Date  . Hypertension   . Diabetes mellitus without complication ([place])   . Stroke [facility])     Patient Active Problem List   Diagnosis Date Noted  . NSTEMI (non-ST elevated myocardial infarction) ([place]) 02/28/2015  . Hypertensive urgency 02/28/2015    Past Surgical History  Procedure Laterality Date  . Neck surgery      Current Outpatient Rx  Name  Route  Sig  Dispense  Refill  . alum & mag hydroxide-simeth (MAALOX PLUS) 400-400-40 MG/5ML suspension   Oral   Take 30 mLs by mouth every 6 (six) hours as needed for indigestion.         [name] amLODipine (NORVASC) 10 MG tablet   Oral   Take 10 mg by mouth daily.         [name] docusate sodium (COLACE) 100 MG capsule   Oral   Take 100 mg by mouth daily as needed for mild constipation.         [name] esomeprazole (NEXIUM) 40 MG capsule   Oral   Take 40 mg by mouth daily.          . fluticasone (FLONASE) 50 MCG/ACT nasal spray   Each Nare   Place 2 sprays into both nostrils daily as  needed for rhinitis.         . furosemide (LASIX) 20 MG tablet   Oral   Take 20 mg by mouth 2 (two) times daily.         . hydrALAZINE (APRESOLINE) 50 MG tablet   Oral   Take 50 mg by mouth 4 (four) times daily.         . insulin aspart (NOVOLOG) 100 UNIT/ML injection   Subcutaneous   Inject 6 Units into the skin 3 (three) times daily as needed for high blood sugar.         . insulin glargine (LANTUS) 100 UNIT/ML injection   Subcutaneous   Inject 20 Units into the skin at bedtime.         . isosorbide mononitrate (IMDUR) 60 MG 24 hr tablet   Oral   Take 60 mg by mouth daily.         [name] lovastatin (MEVACOR) 40 MG tablet   Oral   Take 40 mg by mouth daily.         . quinapril (ACCUPRIL) 40 MG tablet   Oral   Take 40 mg by mouth daily.         . sertraline (ZOLOFT) 25 MG  tablet   Oral   Take 25 mg by mouth daily.           Allergies Aspirin  Family History  Problem Relation Age of Onset  . Hypertension Other     Social History Social History  Substance Use Topics  . Smoking status: Never Smoker   . Smokeless tobacco: None  . Alcohol Use: No    Review of Systems Constitutional: No fever/chills Eyes: No visual changes. ENT: No sore throat. Cardiovascular: Denies chest pain. Respiratory: Denies shortness of breath. Gastrointestinal: No abdominal pain.  No nausea, no vomiting.  No diarrhea.  No constipation. Genitourinary: Negative for dysuria. Musculoskeletal: Negative for back pain. Skin: Negative for rash.   10-point ROS otherwise negative.  ____________________________________________   PHYSICAL EXAM:  VITAL SIGNS: ED Triage Vitals  Enc Vitals Group     BP 10/19/15 1416 153/41 mmHg     Pulse Rate 10/19/15 1416 60     Resp 10/19/15 1416 18     Temp 10/19/15 1416 99.4 F (37.4 C)     Temp Source 10/19/15 1416 Oral     SpO2 10/19/15 1416 95 %     Weight 10/19/15 1416 192 lb (87.091 kg)     Height 10/19/15 1416 5\' 2"  (1.575  m)     Head Cir --      Peak Flow --      Pain Score 10/19/15 1418 0     Pain Loc --      Pain Edu? --      Excl. in [place]? --     Constitutional: Alert and oriented. Well appearing and in no acute distress. Eyes: Conjunctivae are normal. PERRL. EOMI. Head: Atraumatic. Nose: No congestion/rhinnorhea. Mouth/Throat: Mucous membranes are moist.  Oropharynx non-erythematous. Neck: No stridor.   Cardiovascular: Normal rate, regular rhythm. Grossly normal heart sounds.  Good peripheral circulation. Respiratory: Normal respiratory effort.  No retractions. Lungs CTAB. Gastrointestinal: Soft and nontender. No distention. No abdominal bruits. No CVA tenderness. Musculoskeletal: No lower extremity tenderness nor edema.  No joint effusions. There is some low back pain and left-sided pain at the top of the pelvic brim. Neurologic:  Normal speech and language. No gross focal neurologic deficits are appreciated. No gait instability. Skin:  Skin is warm, dry and intact. No rash noted. Psychiatric: Mood and affect are normal. Speech and behavior are normal.  ____________________________________________   LABS (all labs ordered are listed, but only abnormal results are displayed)  Labs Reviewed  CBC - Abnormal; Notable for the following:    Hemoglobin 10.6 (*)    HCT 32.6 (*)    All other components within normal limits  BASIC METABOLIC PANEL  TROPONIN I   ____________________________________________  EKG  EKG is read and interpreted by me shows normal sinus rhythm at a rate of 61 left axis right bundle branch block the computer is reading junctional rhythm but there are P waves. ____________________________________________  RADIOLOGY  CLINICAL DATA: 75 year old female with history of left-sided flank pain for the past 2 months. History of gallstones.  EXAM: CT ABDOMEN AND PELVIS WITHOUT CONTRAST  TECHNIQUE: Multidetector CT imaging of the abdomen and pelvis was performed following  the standard protocol without IV contrast.  COMPARISON: CT the abdomen and pelvis 11/02/2014.  FINDINGS: Lower chest: Extensive bronchial wall thickening in the lower lobes of the lungs bilaterally. Multiple enlarged middle mediastinal lymph nodes adjacent to the distal esophagus measuring up to 11 mm in short axis. Calcified mediastinal and right hilar lymph  nodes are also noted. Mildly enlarged juxtaphrenic lymph nodes measuring up to 11 mm on the right side. These lymph nodes all appear relatively unchanged compared to prior study 11/02/2014, and are therefore favored to be benign. Atherosclerotic calcifications in the left anterior descending, left circumflex and right coronary arteries. Calcifications of the mitral annulus. Small hiatal hernia.  Hepatobiliary: No definite cystic or solid hepatic lesions identified on today's noncontrast CT examination. Gallbladder is not visualized, likely surgically absent (no surgical clips are noted in the gallbladder fossa).  Pancreas: No definite pancreatic mass or peripancreatic inflammatory changes on today's noncontrast CT examination.  Spleen: Unremarkable.  Adrenals/Urinary Tract: Thickening of the adrenal glands bilaterally, similar to the prior study. Bilateral perinephric stranding (nonspecific). There are no abnormal calcifications within the collecting system of either kidney, along the course of either ureter, or within the lumen of the urinary bladder. No hydroureteronephrosis or perinephric stranding to suggest urinary tract obstruction at this time. The unenhanced appearance of the kidneys is unremarkable bilaterally. Unenhanced appearance of the urinary bladder is unremarkable.  Stomach/Bowel: Unenhanced appearance of the stomach is normal. There is no pathologic dilatation of small bowel or colon. Appendicolith inside the appendix. No signs of appendiceal inflammation.  Vascular/Lymphatic: Atherosclerotic  calcifications throughout the abdominal and pelvic vasculature, without evidence of aneurysm. No lymphadenopathy noted in the abdomen or pelvis.  Reproductive: Status post hysterectomy. Ovaries are not confidently identified and may be surgically absent or atrophic.  Other: No significant volume of ascites. No pneumoperitoneum.  Musculoskeletal: There are no aggressive appearing lytic or blastic lesions noted in the visualized portions of the skeleton.  IMPRESSION: 1. No acute findings in the abdomen or pelvis to account for the patient's symptoms. Specifically, no urinary tract calculi no findings of urinary tract obstruction are noted at this time. 2. There is an appendicolith in the appendix, but the appendix does not appear inflamed. This is similar to the prior study. 3. Extensive atherosclerosis, including at least 3 vessel coronary artery disease. Assessment for potential risk factor modification, dietary therapy or pharmacologic therapy may be warranted, if clinically indicated. 4. Nonspecific mildly enlarged lymph nodes throughout the a lower visualized mediastinum, similar to the prior study, presumably benign. Given the presence of numerous calcified lymph nodes, this may reflect sequela of old granulomatous disease or sarcoidosis. 5. Additional incidental findings, as above.   Electronically Signed  By: Vinnie Langton M.D.  On: 10/19/2015 16:55 ____________________________________________   PROCEDURES   ____________________________________________   INITIAL IMPRESSION / ASSESSMENT AND PLAN / ED COURSE  Pertinent labs & imaging results that were available during my care of the patient were reviewed by me and considered in my medical decision making (see chart for details).   ____________________________________________   FINAL CLINICAL IMPRESSION(S) / ED DIAGNOSES  Final diagnoses:  None   Final diagnosis is flank pain   NEW MEDICATIONS  STARTED DURING THIS VISIT:  New Prescriptions   No medications on file     Note:  This document was prepared using Dragon voice recognition software and may include unintentional dictation errors.  Nena Polio, MD 11/11/15 [phone]

## 2015-10-19 NOTE — Addendum Note (Signed)
Addended by: [name] on: 10/19/2015 12:19 PM   Modules accepted: Orders

## 2015-10-19 NOTE — ED Notes (Signed)
Helped pt. To toilet in room to give urine specimen.

## 2015-10-19 NOTE — Telephone Encounter (Signed)
Ems is in patient needs to be seen in office patient is light headed and unstable on feet. No appointment available patient to be seen at ER.

## 2015-10-22 NOTE — Telephone Encounter (Signed)
Spoke with the patient, She needs the prescription sent to [facility] as the prescription costs to much with the mail order.  Resent to [employer].

## 2015-10-22 NOTE — Telephone Encounter (Signed)
Patient called stating that her pregabalin (LYRICA) 100 MG capsule was not sent to [facility] . It looks like it was printed to be sent to [facility] . Needing follow up.

## 2015-10-23 ENCOUNTER — Encounter: Payer: Self-pay | Admitting: Emergency Medicine

## 2015-10-23 ENCOUNTER — Emergency Department
Admission: EM | Admit: 2015-10-23 | Discharge: 2015-10-23 | Disposition: A | Discharge status: Home / Self Care | Payer: Medicare Other | Attending: Student | Admitting: Student

## 2015-10-23 ENCOUNTER — Emergency Department: Payer: Medicare Other

## 2015-10-23 DIAGNOSIS — R55 Syncope and collapse: Secondary | ICD-10-CM | POA: Diagnosis not present

## 2015-10-23 DIAGNOSIS — Z79899 Other long term (current) drug therapy: Secondary | ICD-10-CM | POA: Insufficient documentation

## 2015-10-23 DIAGNOSIS — I951 Orthostatic hypotension: Secondary | ICD-10-CM | POA: Diagnosis not present

## 2015-10-23 DIAGNOSIS — Z955 Presence of coronary angioplasty implant and graft: Secondary | ICD-10-CM | POA: Diagnosis not present

## 2015-10-23 DIAGNOSIS — R42 Dizziness and giddiness: Secondary | ICD-10-CM | POA: Diagnosis not present

## 2015-10-23 DIAGNOSIS — Z7951 Long term (current) use of inhaled steroids: Secondary | ICD-10-CM | POA: Insufficient documentation

## 2015-10-23 DIAGNOSIS — I255 Ischemic cardiomyopathy: Secondary | ICD-10-CM | POA: Insufficient documentation

## 2015-10-23 DIAGNOSIS — J449 Chronic obstructive pulmonary disease, unspecified: Secondary | ICD-10-CM | POA: Diagnosis not present

## 2015-10-23 DIAGNOSIS — M810 Age-related osteoporosis without current pathological fracture: Secondary | ICD-10-CM | POA: Diagnosis not present

## 2015-10-23 DIAGNOSIS — M1990 Unspecified osteoarthritis, unspecified site: Secondary | ICD-10-CM | POA: Insufficient documentation

## 2015-10-23 DIAGNOSIS — E785 Hyperlipidemia, unspecified: Secondary | ICD-10-CM | POA: Insufficient documentation

## 2015-10-23 DIAGNOSIS — E860 Dehydration: Secondary | ICD-10-CM | POA: Insufficient documentation

## 2015-10-23 DIAGNOSIS — I25119 Atherosclerotic heart disease of native coronary artery with unspecified angina pectoris: Secondary | ICD-10-CM | POA: Diagnosis not present

## 2015-10-23 DIAGNOSIS — I110 Hypertensive heart disease with heart failure: Secondary | ICD-10-CM | POA: Insufficient documentation

## 2015-10-23 DIAGNOSIS — E1142 Type 2 diabetes mellitus with diabetic polyneuropathy: Secondary | ICD-10-CM | POA: Insufficient documentation

## 2015-10-23 DIAGNOSIS — Z794 Long term (current) use of insulin: Secondary | ICD-10-CM | POA: Diagnosis not present

## 2015-10-23 DIAGNOSIS — Z7982 Long term (current) use of aspirin: Secondary | ICD-10-CM | POA: Diagnosis not present

## 2015-10-23 DIAGNOSIS — I5032 Chronic diastolic (congestive) heart failure: Secondary | ICD-10-CM | POA: Diagnosis not present

## 2015-10-23 DIAGNOSIS — W19XXXA Unspecified fall, initial encounter: Secondary | ICD-10-CM

## 2015-10-23 DIAGNOSIS — I739 Peripheral vascular disease, unspecified: Secondary | ICD-10-CM | POA: Insufficient documentation

## 2015-10-23 DIAGNOSIS — F329 Major depressive disorder, single episode, unspecified: Secondary | ICD-10-CM | POA: Insufficient documentation

## 2015-10-23 LAB — URINALYSIS COMPLETE WITH MICROSCOPIC (ARMC ONLY)
BILIRUBIN URINE: NEGATIVE
GLUCOSE, UA: NEGATIVE mg/dL
HGB URINE DIPSTICK: NEGATIVE
Ketones, ur: NEGATIVE mg/dL
Nitrite: NEGATIVE
Protein, ur: 30 mg/dL — AB
Specific Gravity, Urine: 1.011 (ref 1.005–1.030)
pH: 5 (ref 5.0–8.0)

## 2015-10-23 LAB — TROPONIN I: Troponin I: 0.03 ng/mL (ref <0.031)

## 2015-10-23 LAB — COMPREHENSIVE METABOLIC PANEL
ALBUMIN: 3.6 g/dL (ref 3.5–5.0)
ALK PHOS: 40 U/L (ref 38–126)
ALT: 16 U/L (ref 14–54)
ANION GAP: 9 (ref 5–15)
AST: 20 U/L (ref 15–41)
BILIRUBIN TOTAL: 0.8 mg/dL (ref 0.3–1.2)
BUN: 37 mg/dL — AB (ref 6–20)
CALCIUM: 9.1 mg/dL (ref 8.9–10.3)
CO2: 24 mmol/L (ref 22–32)
CREATININE: 2.05 mg/dL — AB (ref 0.44–1.00)
Chloride: 106 mmol/L (ref 101–111)
GFR calc Af Amer: 26 mL/min — ABNORMAL LOW (ref >60)
GFR calc non Af Amer: 23 mL/min — ABNORMAL LOW (ref >60)
GLUCOSE: 153 mg/dL — AB (ref 65–99)
Potassium: 3.5 mmol/L (ref 3.5–5.1)
Sodium: 139 mmol/L (ref 135–145)
TOTAL PROTEIN: 7.4 g/dL (ref 6.5–8.1)

## 2015-10-23 LAB — CBC
HCT: 32.4 % — ABNORMAL LOW (ref 35.0–47.0)
Hemoglobin: 11 g/dL — ABNORMAL LOW (ref 12.0–16.0)
MCH: 30.2 pg (ref 26.0–34.0)
MCHC: 33.9 g/dL (ref 32.0–36.0)
MCV: 89 fL (ref 80.0–100.0)
PLATELETS: 273 10*3/uL (ref 150–440)
RBC: 3.64 MIL/uL — AB (ref 3.80–5.20)
RDW: 15 % — AB (ref 11.5–14.5)
WBC: 7.9 10*3/uL (ref 3.6–11.0)

## 2015-10-23 MED ORDER — SODIUM CHLORIDE 0.9 % IV BOLUS (SEPSIS)
1000.0000 mL | Freq: Once | INTRAVENOUS | Status: AC
  Administered 2015-10-23: 1000 mL via INTRAVENOUS

## 2015-10-23 MED ORDER — ACETAMINOPHEN 500 MG PO TABS
1000.0000 mg | ORAL_TABLET | Freq: Once | ORAL | Status: AC
  Administered 2015-10-23: 1000 mg via ORAL
  Filled 2015-10-23: qty 2

## 2015-10-23 NOTE — ED Provider Notes (Signed)
[facility] Emergency Department Provider Note   ____________________________________________  Time seen: Approximately 2:24 PM  I have reviewed the triage vital signs and the nursing notes.   HISTORY  Chief Complaint Loss of Consciousness and Dizziness    HPI Pamela Collier is a 75 y.o. female with a history of coronary disease, peripheral vascular disease, renal insufficiency, diabetes, ischemic cardiomyopathy with EF of 50-55% on echo in 04/2015, pulmonary sarcoidosis, COPD who presents for evaluation of lightheadedness and fall with head injury which occurred suddenly just prior to arrival, gradual onset, moderate. Patient reports that she stood up and started walking towards the bathroom when she began feeling lightheaded and like she might faint. She does not think she fainted but she did fall and hit her head, she is complaining of some right chest pain and right arm pain that began after the fall, headache and mild left leg/hip pain since the fall. She had no chest pain or difficulty breathing preceding the fall. She was seen here in this emergency department on 10/19/2015 for flank pain and lightheadedness, abdominal CT scan which was reassuring and was discharged back home. She was recently treated for bronchitis, she has had one episode of nonbloody nonbilious emesis today, 2 episodes yesterday. She denies any room spinning dizziness.   Past Medical History  Diagnosis Date  . Coronary artery disease, occlusive     a. Previous PCI to the LAD, LCx, and RCA in 2010, 2011, 2013, and 2016. All performed in [place].  . Cardiomyopathy, ischemic     a. EF 35 to 40% by echo in 2013 b. EF improved to 50-55% by echo in 04/2015.  [name] Hepatic steatosis     by CT abd pelvis  . Osteoporosis, post-menopausal   . 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 Atherosclerotic peripheral vascular  disease with gangrene [facility]) august 2012  . Hypertensive heart disease   . Sarcoidosis ([place])   . Chronic diastolic CHF (congestive heart failure) (HCC)     a. EF 50-55% by echo in 04/2015  . Renal insufficiency   . Pleural effusion 10/25/2012    10/2012 CT chest >> small to moderate R lung effusion>> chylothorax, 100% lymphs 10/2013 thoracentesis> cytology negative, WBC 1471, > 90% "small lymphs" 01/2014 CT chest> near complete resolution of pleural effusion, stable lymphadenopathy 08/2014 CT chest [place] ([facility]): small right sided effusion decreased in size, stable mediastinal lymphadenopathy 1.0cm largest, 41m  . 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 Acute respiratory failure ([place]) 11/21/2014  . Diabetic diarrhea ([place]) 10/03/2014  . DM type 2, uncontrolled, with renal complications ([place]) [id number]  . Cerebral infarct ([place]) 08/17/2013  . Arthritis 08/05/2013  . 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 Hyperlipidemia LDL goal <100 02/23/2014  . COPD (chronic obstructive pulmonary disease) [facility])     Patient Active Problem List   Diagnosis Date Noted  . Type 2 diabetes mellitus, uncontrolled, with neuropathy ([place]) 09/27/2015  . Hypertensive heart disease   . Coronary artery disease, occlusive   . OSA on CPAP 07/07/2015  . Hospital discharge follow-up 07/07/2015  . Chest pain with  low risk of acute coronary syndrome 06/29/2015  . Community acquired pneumonia   . Pleural effusion, right   . Coronary artery disease involving native coronary artery of native heart with angina pectoris ([place])   . Diarrhea 05/15/2015  . Fall 04/19/2015  . Acute on chronic kidney failure ([place]) 04/19/2015  . Chronic diastolic CHF (congestive heart failure) ([place])  04/18/2015  . Diabetic neuropathy ([place]) 02/21/2015  . Benign neoplasm of sigmoid colon   . Diabetic diarrhea ([place]) 10/03/2014  . Low back pain with radiation 04/04/2014  . Essential hypertension 04/04/2014  . Hyperlipidemia LDL goal <100 02/23/2014  . Long-term use of high-risk medication 02/23/2014  . Dermatophytic onychia 08/17/2013  . Angina pectoris ([place]) 08/17/2013  . Arteriosclerosis of coronary artery 08/17/2013  . Cerebral infarct ([place]) 08/17/2013  . Bony exostosis 08/17/2013  . Arthritis 08/05/2013  . Unsteady gait 03/24/2013  . History of colonic polyps 02/20/2013  . 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 Dysphagia 12/07/2012  . Cough 10/25/2012  . Pulmonary nodule seen on imaging study 05/20/2012  . 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 Cardiomyopathy, ischemic   . Hepatic steatosis   . Osteoporosis, post-menopausal   . Peripheral vascular disease due to secondary diabetes mellitus ([place])   . DM (diabetes mellitus), type 2, uncontrolled, with renal complications ([place]) [id number]  . Double vessel coronary artery disease 12/14/2011    Past Surgical History  Procedure Laterality Date  . Abdominal hysterectomy      at ge 39. secondary to bleeding  . Toe amputation  Sept 2012    Right 2nd toe, [place]  . Ptca  August 2012    Right Posterior tibial artery , [place]  . Colonoscopy with propofol N/A 01/09/2015    Procedure: COLONOSCOPY WITH PROPOFOL;  Surgeon: Lucilla Lame, MD;  Location: [facility] ENDOSCOPY;  Service: Endoscopy;  Laterality: N/A;  . Cholecystectomy    . Carpal tunnel release Bilateral   . Breast cyst aspiration Right   . Coronary angioplasty with stent placement      [place]; [facility]    Current Outpatient Rx  Name  Route  Sig  Dispense  Refill  . acetaminophen (TYLENOL) 500 MG tablet   Oral   Take 500 mg by mouth every 6 (six) hours as needed for mild pain or  headache.         . albuterol (PROAIR HFA) 108 (90 Base) MCG/ACT inhaler   Inhalation   Inhale 2 puffs into the lungs every 6 (six) hours as needed for wheezing or shortness of breath.   1 Inhaler   2   . albuterol (PROVENTIL) (2.5 MG/3ML) 0.083% nebulizer solution   Nebulization   Take 3 mLs (2.5 mg total) by nebulization every 6 (six) hours as needed for wheezing or shortness of breath.   150 mL   1   . alendronate (FOSAMAX) 70 MG tablet   Oral   Take 70 mg by mouth once a week. Pt takes on Sunday.   Take with a full glass of water on an empty stomach.         . ALPRAZolam (XANAX) 0.5 MG tablet   Oral   Take 0.5 mg by mouth daily.         [name] aspirin EC 81 MG tablet   Oral   Take 81 mg by mouth daily.         . benzonatate (TESSALON) 100 MG capsule  Oral   Take 200 mg by mouth 3 (three) times daily as needed for cough.         . BRILINTA 90 MG TABS tablet   Oral   Take 1 tablet (90 mg total) by mouth 2 (two) times daily.   180 tablet   2     Dispense as written.   . budesonide-formoterol (SYMBICORT) 160-4.5 MCG/ACT inhaler   Inhalation   Inhale 2 puffs into the lungs 2 (two) times daily.   1 Inhaler   0   . docusate sodium (COLACE) 100 MG capsule   Oral   Take 100 mg by mouth 2 (two) times daily as needed for mild constipation.         . fluticasone (FLONASE) 50 MCG/ACT nasal spray   Each Nare   Place 2 sprays into both nostrils daily as needed for rhinitis.         . furosemide (LASIX) 20 MG tablet   Oral   Take 1 tablet (20 mg total) by mouth daily.   90 tablet   1   . insulin aspart protamine- aspart (NOVOLOG MIX 70/30) (70-30) 100 UNIT/ML injection   Subcutaneous   Inject 44-60 Units into the skin 2 (two) times daily with a meal. Pt uses 44 units in the morning and 60 units at night.         . Ipratropium-Albuterol (COMBIVENT RESPIMAT) 20-100 MCG/ACT AERS respimat   Inhalation   Inhale 1 puff into the lungs 3 (three) times daily as  needed for wheezing or shortness of breath.         . isosorbide mononitrate (IMDUR) 30 MG 24 hr tablet   Oral   Take 30 mg by mouth daily.         [name] losartan (COZAAR) 100 MG tablet   Oral   Take 1 tablet (100 mg total) by mouth daily.   90 tablet   0   . metoprolol tartrate (LOPRESSOR) 25 MG tablet   Oral   Take 1 tablet (25 mg total) by mouth 2 (two) times daily.   90 tablet   3   . nitroGLYCERIN (NITROSTAT) 0.4 MG SL tablet   Sublingual   Place 1 tablet (0.4 mg total) under the tongue every 5 (five) minutes as needed for chest pain.   25 tablet   3   . PARoxetine (PAXIL) 20 MG tablet   Oral   Take 1 tablet (20 mg total) by mouth daily.   30 tablet   1   . polyethylene glycol (MIRALAX / GLYCOLAX) packet   Oral   Take 17 g by mouth daily as needed for mild constipation.         . pregabalin (LYRICA) 100 MG capsule   Oral   Take 1 capsule (100 mg total) by mouth 3 (three) times daily.   270 capsule   2   . rosuvastatin (CRESTOR) 20 MG tablet   Oral   Take 20 mg by mouth at bedtime.         . traMADol (ULTRAM) 50 MG tablet   Oral   Take 50 mg by mouth every 6 (six) hours as needed for moderate pain.         . Vitamin D, Ergocalciferol, (DRISDOL) 50000 units CAPS capsule   Oral   Take 50,000 Units by mouth every 7 (seven) days. Pt takes on Sunday.           Allergies Contrast media; Gabapentin; and  Sulfa antibiotics  Family History  Problem Relation Age of Onset  . Heart disease Father   . Heart attack Father   . Heart disease Sister   . Heart attack Sister   . Heart disease Brother   . Heart attack Brother   . Asthma Grandchild   . Breast cancer Maternal Aunt     Social History Social History  Substance Use Topics  . Smoking status: Never Smoker   . Smokeless tobacco: Never Used  . Alcohol Use: No    Review of Systems Constitutional: No fever/chills Eyes: No visual changes. ENT: No sore throat. Cardiovascular: +right chest  pain after fall Respiratory: Denies shortness of breath. Gastrointestinal: No abdominal pain.  No nausea, no vomiting.  No diarrhea.  No constipation. Genitourinary: Negative for dysuria. Musculoskeletal: Negative for back pain. Skin: Negative for rash. Neurological: Positive for headache, no focal weakness or numbness.  10-point ROS otherwise negative.  ____________________________________________   PHYSICAL EXAM: Filed Vitals:   10/23/15 1900 10/23/15 1909 10/23/15 1910 10/23/15 1914  BP: 127/52 157/55  157/55  Pulse: 63  67 66  Temp:      TempSrc:      Resp:    18  Height:      Weight:      SpO2: 97%  100% 97%     VITAL SIGNS: ED Triage Vitals  Enc Vitals Group     BP --      Pulse --      Resp --      Temp --      Temp src --      SpO2 10/23/15 1416 98 %     Weight --      Height --      Head Cir --      Peak Flow --      Pain Score --      Pain Loc --      Pain Edu? --      Excl. in [place]? --     Constitutional: Alert and oriented. Well appearing and in no acute distress. Eyes: Conjunctivae are normal. PERRL. EOMI. Head: Atraumatic. Nose: No congestion/rhinnorhea. Mouth/Throat: Mucous membranes are moist.  Oropharynx non-erythematous. Neck: No stridor.  No cervical spine tenderness to palpation. Cardiovascular: Normal rate, regular rhythm. Grossly normal heart sounds.  Good peripheral circulation. Respiratory: Normal respiratory effort.  No retractions. Lungs CTAB. Gastrointestinal: Soft and nontender. No distention. No CVA tenderness. Genitourinary: deferred Musculoskeletal: Mild tenderness in the right upper arm without bony step-off or deformity, full range of motion at all large joints in the right arm, 2+ right radial pulse. Mild tenderness in the upper thigh but full range of motion at the left hip, the leg is not shortened or rotated in any way. Neurologic:  Normal speech and language. No gross focal neurologic deficits are appreciated. 5 out of 5  strength bilateral upper and lower extremity, sensation intact to light touch throughout, cranial nerves II through XII intact, normal finger-nose-finger. Skin:  Skin is warm, dry and intact. No rash noted. Psychiatric: Mood and affect are normal. Speech and behavior are normal.  ____________________________________________   LABS (all labs ordered are listed, but only abnormal results are displayed)  Labs Reviewed  CBC - Abnormal; Notable for the following:    RBC 3.64 (*)    Hemoglobin 11.0 (*)    HCT 32.4 (*)    RDW 15.0 (*)    All other components within normal limits  URINALYSIS COMPLETEWITH MICROSCOPIC (  [facility] ONLY) - Abnormal; Notable for the following:    Color, Urine YELLOW (*)    APPearance HAZY (*)    Protein, ur 30 (*)    Leukocytes, UA 1+ (*)    Bacteria, UA RARE (*)    Squamous Epithelial / LPF 6-30 (*)    All other components within normal limits  COMPREHENSIVE METABOLIC PANEL - Abnormal; Notable for the following:    Glucose, Bld 153 (*)    BUN 37 (*)    Creatinine, Ser 2.05 (*)    GFR calc non Af Amer 23 (*)    GFR calc Af Amer 26 (*)    All other components within normal limits  URINE CULTURE  TROPONIN I   ____________________________________________  EKG  ED ECG REPORT I, Joanne Gavel, the attending physician, personally viewed and interpreted this ECG.   Date: 10/23/2015  EKG Time: 14:18  Rate: 64  Rhythm: normal sinus rhythm  Axis: normal  Intervals: LVH, interventricular conduction delay, possibly atypical right bundle branch block  ST&T Change: No acute ST elevation or ST depression. EKG unchanged from 10/19/2015.  ____________________________________________  RADIOLOGY  CXR  IMPRESSION: Low volume chest without acute finding.   CT head IMPRESSION: No acute finding or change from prior. ____________________________________________   PROCEDURES  Procedure(s) performed: None  Critical Care performed:  No  ____________________________________________   INITIAL IMPRESSION / ASSESSMENT AND PLAN / ED COURSE  Pertinent labs & imaging results that were available during my care of the patient were reviewed by me and considered in my medical decision making (see chart for details).  Pamela Collier is a 75 y.o. female with a history of coronary disease, peripheral vascular disease, renal insufficiency, diabetes, ischemic cardiomyopathy with EF of 50-55% on echo in 04/2015, pulmonary sarcoidosis, COPD who presents for evaluation of lightheadedness and fall with head injury which occurred suddenly just prior to arrival. On exam, she is nontoxic appearing and in no acute distress, vital signs stable, she is afebrile. She has a nonfocal neurological examination. Minor tenderness in the right upper arm and the left thigh but no bony step-off or deformity which would suggest acute serious injury. Systolic blood pressure drops from 119 to 98 upon standing and I suspect she has orthostatic hypotension/vasovagal near syncope as the most likely cause of his episode today. No chest pain or difficulty breathing, EKG and just from prior, doubt Cardiogenic cause of syncope. Intact neurological exam, doubt neurogenic cause of syncope. We'll hydrate her, obtain screening labs, chest x-ray, CT head, urinalysis and reassess for disposition.  ----------------------------------------- 7:53 PM on 10/23/2015 ----------------------------------------- I reviewed the patient's labs. CBC with mild chronic anemia. CMP shows mild BUN and creatinine elevation, creatinine today is 2.07, baseline appears to closer to 1.5. The patient received IV fluids. Troponin is negative. Imaging negative for any acute process today. Urinalysis with 1+ leukocytes, 6-30 white blood cells however rare bacteria and multiple squamous cells, likely contaminated, we'll send cultures and way to treat. At this time, the patient reports she feels much better  after IV fluids. She has eaten, she immunized well with her walker and is no longer lightheaded. I discussed the tragus return precautions with her, need for close ECP follow-up and she is comfortable with the discharge plan. DC home.  ____________________________________________   FINAL CLINICAL IMPRESSION(S) / ED DIAGNOSES  Final diagnoses:  Dehydration  Fall, initial encounter  Orthostatic hypotension  Lightheadedness      NEW MEDICATIONS STARTED DURING THIS VISIT:  New  Prescriptions   No medications on file     Note:  This document was prepared using Dragon voice recognition software and may include unintentional dictation errors.    Joanne Gavel, MD 10/23/15 1958

## 2015-10-23 NOTE — ED Notes (Signed)
Pt called EMS after having "fainting episode" this morning (unsure if she lost consciousness); states she had fainting episode 4 days ago for which she was evaluated in the ED for and then discharged back home.  Pt reports she hit her head against wall when she fell.  Complaining of headache, R arm pain, L hip and L leg Pt soreness.  Pt was using her cane when she fell.

## 2015-10-23 NOTE — ED Notes (Signed)
Patient ambulated in hallway using walker. Tolerated well. States her calves feel stiff but other than that, feels okay.

## 2015-10-24 ENCOUNTER — Telehealth: Payer: Self-pay | Admitting: Internal Medicine

## 2015-10-24 DIAGNOSIS — E1121 Type 2 diabetes mellitus with diabetic nephropathy: Secondary | ICD-10-CM | POA: Diagnosis not present

## 2015-10-24 DIAGNOSIS — E1140 Type 2 diabetes mellitus with diabetic neuropathy, unspecified: Secondary | ICD-10-CM | POA: Diagnosis not present

## 2015-10-24 MED ORDER — ROSUVASTATIN CALCIUM 20 MG PO TABS: 20.0000 mg | ORAL_TABLET | Freq: Every day | ORAL | Status: DC

## 2015-10-24 NOTE — Telephone Encounter (Signed)
Patuient scheduled and notified by Alvis Lemmings and Crestor called to pharmacy.

## 2015-10-24 NOTE — Telephone Encounter (Signed)
Cindy from [place] [facility] called. Pt was seen at the ED. She was dehydrated and kidney function is low. [place] wants pt to see Dr. Derrel Nip this week. Please call and advise. Call [place] at [phone].

## 2015-10-25 ENCOUNTER — Telehealth: Payer: Self-pay | Admitting: Internal Medicine

## 2015-10-25 ENCOUNTER — Other Ambulatory Visit: Payer: Self-pay

## 2015-10-25 ENCOUNTER — Encounter: Payer: Self-pay | Admitting: Internal Medicine

## 2015-10-25 ENCOUNTER — Ambulatory Visit (INDEPENDENT_AMBULATORY_CARE_PROVIDER_SITE_OTHER): Payer: Medicare Other | Admitting: Internal Medicine

## 2015-10-25 VITALS — BP 168/68 | HR 69 | Temp 98.3°F | Ht 67.0 in | Wt 192.1 lb

## 2015-10-25 DIAGNOSIS — N342 Other urethritis: Secondary | ICD-10-CM

## 2015-10-25 DIAGNOSIS — E1121 Type 2 diabetes mellitus with diabetic nephropathy: Secondary | ICD-10-CM | POA: Diagnosis not present

## 2015-10-25 DIAGNOSIS — I779 Disorder of arteries and arterioles, unspecified: Secondary | ICD-10-CM | POA: Diagnosis not present

## 2015-10-25 DIAGNOSIS — R112 Nausea with vomiting, unspecified: Secondary | ICD-10-CM | POA: Diagnosis not present

## 2015-10-25 DIAGNOSIS — N3000 Acute cystitis without hematuria: Secondary | ICD-10-CM

## 2015-10-25 DIAGNOSIS — N179 Acute kidney failure, unspecified: Secondary | ICD-10-CM

## 2015-10-25 DIAGNOSIS — E860 Dehydration: Secondary | ICD-10-CM | POA: Diagnosis not present

## 2015-10-25 DIAGNOSIS — IMO0002 Reserved for concepts with insufficient information to code with codable children: Secondary | ICD-10-CM

## 2015-10-25 DIAGNOSIS — E1165 Type 2 diabetes mellitus with hyperglycemia: Secondary | ICD-10-CM

## 2015-10-25 DIAGNOSIS — N189 Chronic kidney disease, unspecified: Secondary | ICD-10-CM

## 2015-10-25 DIAGNOSIS — E1140 Type 2 diabetes mellitus with diabetic neuropathy, unspecified: Secondary | ICD-10-CM

## 2015-10-25 DIAGNOSIS — Z794 Long term (current) use of insulin: Secondary | ICD-10-CM

## 2015-10-25 LAB — COMPREHENSIVE METABOLIC PANEL
ALT: 14 U/L (ref 0–35)
AST: 17 U/L (ref 0–37)
Albumin: 4.1 g/dL (ref 3.5–5.2)
Alkaline Phosphatase: 48 U/L (ref 39–117)
BUN: 27 mg/dL — AB (ref 6–23)
CHLORIDE: 105 meq/L (ref 96–112)
CO2: 27 meq/L (ref 19–32)
CREATININE: 1.53 mg/dL — AB (ref 0.40–1.20)
Calcium: 9.5 mg/dL (ref 8.4–10.5)
GFR: 42.5 mL/min — ABNORMAL LOW (ref >60.00)
Glucose, Bld: 194 mg/dL — ABNORMAL HIGH (ref 70–99)
Potassium: 4.3 mEq/L (ref 3.5–5.1)
SODIUM: 137 meq/L (ref 135–145)
Total Bilirubin: 0.9 mg/dL (ref 0.2–1.2)
Total Protein: 7.9 g/dL (ref 6.0–8.3)

## 2015-10-25 LAB — URINALYSIS, ROUTINE W REFLEX MICROSCOPIC
Bilirubin Urine: NEGATIVE
KETONES UR: NEGATIVE
Leukocytes, UA: NEGATIVE
NITRITE: NEGATIVE
SPECIFIC GRAVITY, URINE: 1.02 (ref 1.000–1.030)
TOTAL PROTEIN, URINE-UPE24: 100 — AB
URINE GLUCOSE: NEGATIVE
Urobilinogen, UA: 0.2 (ref 0.0–1.0)
pH: 5.5 (ref 5.0–8.0)

## 2015-10-25 LAB — URINE CULTURE

## 2015-10-25 MED ORDER — METOPROLOL TARTRATE 25 MG PO TABS: 25.0000 mg | ORAL_TABLET | Freq: Two times a day (BID) | ORAL | Status: DC

## 2015-10-25 MED ORDER — ONDANSETRON HCL 8 MG PO TABS: 8.0000 mg | ORAL_TABLET | Freq: Three times a day (TID) | ORAL | Status: DC | PRN

## 2015-10-25 MED ORDER — CIPROFLOXACIN HCL 500 MG PO TABS: 500.0000 mg | ORAL_TABLET | Freq: Two times a day (BID) | ORAL | Status: DC

## 2015-10-25 NOTE — Telephone Encounter (Signed)
Thank you Jose.  TT

## 2015-10-25 NOTE — Progress Notes (Signed)
Subjective:  Patient ID: Pamela Collier, female    DOB: 1941-01-21  Age: 75 y.o. MRN: [id number]  CC: The primary encounter diagnosis was Non-intractable vomiting with nausea, unspecified vomiting type. Diagnoses of Infective urethritis, Uncontrolled type 2 diabetes mellitus with diabetic nephropathy, with long-term current use of insulin ([place]), Dehydration, Type 2 diabetes mellitus, uncontrolled, with neuropathy ([place]), Acute cystitis without hematuria, and Acute on chronic kidney failure ([place]) were also pertinent to this visit.  HPI LEIANA KESTNER presents for ER follow up.  She was treated on  June 13th for light headedness leading to a  fall with blunt head injury .  Was dehydrated based on rise in Cr,  Was given 2 L  IV fluids , tested for UTI and  Urine culture showed too many species  But pyuria was noted on ua. CT head normal   She had another ER visit less than a week prior , on jun 9th for flank pain and light headedness.  CT was normal     Doesn't look well today. States she is too weak to do anything .  Has [facility] coming out to help with medications.  Wokeu p with nausea with morning and has vomited phlegm, has not eaten,  No appetite.  CBG was 151 this morning . She has been suspending her insulin doses when she doesn't eat.  Has been trying to increase her water intake but only 24 ounces max ansd glass is filled with ice per husband.   Lab Results  Component Value Date   HGBA1C 9.6* 09/26/2015    Outpatient Prescriptions Prior to Visit  Medication Sig Dispense Refill  . acetaminophen (TYLENOL) 500 MG tablet Take 500 mg by mouth every 6 (six) hours as needed for mild pain or headache.    . albuterol (PROAIR HFA) 108 (90 Base) MCG/ACT inhaler Inhale 2 puffs into the lungs every 6 (six) hours as needed for wheezing or shortness of breath. 1 Inhaler 2  . albuterol (PROVENTIL) (2.5 MG/3ML) 0.083% nebulizer solution Take 3 mLs (2.5 mg total) by nebulization every 6  (six) hours as needed for wheezing or shortness of breath. 150 mL 1  . alendronate (FOSAMAX) 70 MG tablet Take 70 mg by mouth once a week. Pt takes on Sunday.   Take with a full glass of water on an empty stomach.    . ALPRAZolam (XANAX) 0.5 MG tablet Take 0.5 mg by mouth daily.    [name] aspirin EC 81 MG tablet Take 81 mg by mouth daily.    . benzonatate (TESSALON) 100 MG capsule Take 200 mg by mouth 3 (three) times daily as needed for cough.    . BRILINTA 90 MG TABS tablet Take 1 tablet (90 mg total) by mouth 2 (two) times daily. 180 tablet 2  . budesonide-formoterol (SYMBICORT) 160-4.5 MCG/ACT inhaler Inhale 2 puffs into the lungs 2 (two) times daily. 1 Inhaler 0  . docusate sodium (COLACE) 100 MG capsule Take 100 mg by mouth 2 (two) times daily as needed for mild constipation.    . fluticasone (FLONASE) 50 MCG/ACT nasal spray Place 2 sprays into both nostrils daily as needed for rhinitis.    . furosemide (LASIX) 20 MG tablet Take 1 tablet (20 mg total) by mouth daily. 90 tablet 1  . insulin aspart protamine- aspart (NOVOLOG MIX 70/30) (70-30) 100 UNIT/ML injection Inject 44-60 Units into the skin 2 (two) times daily with a meal. Pt uses 44 units in the morning and  60 units at night.    . isosorbide mononitrate (IMDUR) 30 MG 24 hr tablet Take 30 mg by mouth daily.    [name] losartan (COZAAR) 100 MG tablet Take 1 tablet (100 mg total) by mouth daily. 90 tablet 0  . nitroGLYCERIN (NITROSTAT) 0.4 MG SL tablet Place 1 tablet (0.4 mg total) under the tongue every 5 (five) minutes as needed for chest pain. 25 tablet 3  . PARoxetine (PAXIL) 20 MG tablet Take 1 tablet (20 mg total) by mouth daily. 30 tablet 1  . pregabalin (LYRICA) 100 MG capsule Take 1 capsule (100 mg total) by mouth 3 (three) times daily. 270 capsule 2  . rosuvastatin (CRESTOR) 20 MG tablet Take 20 mg by mouth at bedtime.    . traMADol (ULTRAM) 50 MG tablet Take 50 mg by mouth every 6 (six) hours as needed for moderate pain.    . Vitamin D,  Ergocalciferol, (DRISDOL) 50000 units CAPS capsule Take 50,000 Units by mouth every 7 (seven) days. Pt takes on Sunday.    . metoprolol tartrate (LOPRESSOR) 25 MG tablet Take 1 tablet (25 mg total) by mouth 2 (two) times daily. 90 tablet 3  . Ipratropium-Albuterol (COMBIVENT RESPIMAT) 20-100 MCG/ACT AERS respimat Inhale 1 puff into the lungs 3 (three) times daily as needed for wheezing or shortness of breath. Reported on 10/25/2015    . polyethylene glycol (MIRALAX / GLYCOLAX) packet Take 17 g by mouth daily as needed for mild constipation. Reported on 10/25/2015    . rosuvastatin (CRESTOR) 20 MG tablet Take 1 tablet (20 mg total) by mouth daily. 90 tablet 1   Facility-Administered Medications Prior to Visit  Medication Dose Route Frequency Provider Last Rate Last Dose  . albuterol (PROVENTIL) (5 MG/ML) 0.5% nebulizer solution 2.5 mg  2.5 mg Nebulization Once [name], MD        Review of Systems;  Patient denies headache, fevers, malaise, unintentional weight loss, skin rash, eye pain, sinus congestion and sinus pain, sore throat, dysphagia,  hemoptysis , cough, dyspnea, wheezing, chest pain, palpitations, orthopnea, edema, abdominal pain, nausea, melena, diarrhea, constipation, flank pain, dysuria, hematuria, urinary  Frequency, nocturia, numbness, tingling, seizures,  Focal weakness, Loss of consciousness,  Tremor, insomnia, depression, anxiety, and suicidal ideation.      Objective:  BP 168/68 mmHg  Pulse 69  Temp(Src) 98.3 F (36.8 C) (Oral)  Ht 5\' 7"  (1.702 m)  Wt 192 lb 2 oz (87.147 kg)  BMI 30.08 kg/m2  SpO2 95%  BP Readings from Last 3 Encounters:  10/25/15 168/68  10/23/15 124/54  10/19/15 107/62    Wt Readings from Last 3 Encounters:  10/25/15 192 lb 2 oz (87.147 kg)  10/23/15 190 lb (86.183 kg)  10/19/15 205 lb (92.987 kg)    General appearance: alert, cooperative and appears stated age Ears: normal TM's and external ear canals both ears Throat: lips, mucosa,  and tongue normal; teeth and gums normal Neck: no adenopathy, no carotid bruit, supple, symmetrical, trachea midline and thyroid not enlarged, symmetric, no tenderness/mass/nodules Back: symmetric, no curvature. ROM normal. No CVA tenderness. Lungs: clear to auscultation bilaterally Heart: regular rate and rhythm, S1, S2 normal, no murmur, click, rub or gallop Abdomen: soft, non-tender; bowel sounds normal; no masses,  no organomegaly Pulses: 2+ and symmetric Skin: Skin color, texture, turgor normal. No rashes or lesions Lymph nodes: Cervical, supraclavicular, and axillary nodes normal.  Lab Results  Component Value Date   HGBA1C 9.6* 09/26/2015   HGBA1C 7.8* 06/08/2015  HGBA1C 7.5* 03/05/2015    Lab Results  Component Value Date   CREATININE 1.53* 10/25/2015   CREATININE 2.05* 10/23/2015   CREATININE 1.91* 10/19/2015    Lab Results  Component Value Date   WBC 7.9 10/23/2015   HGB 11.0* 10/23/2015   HCT 32.4* 10/23/2015   PLT 273 10/23/2015   GLUCOSE 194* 10/25/2015   CHOL 151 06/08/2015   TRIG 148.0 06/08/2015   HDL 31.80* 06/08/2015   LDLDIRECT 73.0 09/26/2015   LDLCALC 89 06/08/2015   ALT 14 10/25/2015   AST 17 10/25/2015   NA 137 10/25/2015   K 4.3 10/25/2015   CL 105 10/25/2015   CREATININE 1.53* 10/25/2015   BUN 27* 10/25/2015   CO2 27 10/25/2015   TSH 1.16 07/01/2013   INR 1.2* 02/02/2015   HGBA1C 9.6* 09/26/2015   MICROALBUR 28.9* 09/26/2015    Dg Chest 2 View  10/23/2015  CLINICAL DATA:  Dizziness and syncope EXAM: CHEST  2 VIEW COMPARISON:  09/12/2015 FINDINGS: Low volume chest with crowding/mild atelectasis, similar to previous. There is no edema, consolidation, effusion, or pneumothorax. Normal heart size and aortic contours. No acute osseous finding. IMPRESSION: Low volume chest without acute finding. Electronically Signed   By: Monte Fantasia M.D.   On: 10/23/2015 15:14   Ct Head Wo Contrast  10/23/2015  CLINICAL DATA:  Headache with dizziness and  syncope. EXAM: CT HEAD WITHOUT CONTRAST TECHNIQUE: Contiguous axial images were obtained from the base of the skull through the vertex without intravenous contrast. COMPARISON:  10/02/2015 FINDINGS: Skull and Sinuses:Negative for fracture or destructive process. Hyperostosis. No sinusitis or mastoiditis. Visualized orbits: Negative. Brain: No evidence of acute infarction, hemorrhage, hydrocephalus, or mass lesion/mass effect. Generalized atrophy that is mild but underestimated due to hyperostosis. IMPRESSION: No acute finding or change from prior. Electronically Signed   By: Monte Fantasia M.D.   On: 10/23/2015 14:54    Assessment & Plan:   Problem List Items Addressed This Visit    Acute on chronic kidney failure (HCC)    Recurrent, currently resolved.       Type 2 diabetes mellitus, uncontrolled, with neuropathy ([place])    Advised to reduce dose of insulin to 10 units 70/30 rather than suspending completely when not eating.  Diet reviewed, as she remains uncontrolled . Referral to Diabetes education       Dehydration    Urged to increase her water intake.       RESOLVED: Urinary tract infection   Relevant Orders   Urinalysis, dipstick only   Urinalysis, Routine w reflex microscopic (not at [facility]) (Completed)   Urine culture (Completed)   DM (diabetes mellitus), type 2, uncontrolled, with renal complications ([place])   Relevant Orders   Ambulatory referral to Nutrition and Diabetic Education    Other Visit Diagnoses    Non-intractable vomiting with nausea, unspecified vomiting type    -  Primary    Relevant Orders    Comprehensive metabolic panel (Completed)      A total of 25 minutes of face to face time was spent with patient more than half of which was spent in counselling about the above mentioned conditions  and coordination of care  I am having Ms. Snider maintain her albuterol, budesonide-formoterol, nitroGLYCERIN, PARoxetine, losartan, furosemide, BRILINTA, albuterol,  pregabalin, acetaminophen, alendronate, ALPRAZolam, benzonatate, Ipratropium-Albuterol, docusate sodium, Vitamin D (Ergocalciferol), fluticasone, insulin aspart protamine- aspart, isosorbide mononitrate, rosuvastatin, polyethylene glycol, traMADol, and aspirin EC.  No orders of the defined types were placed in this  encounter.    Medications Discontinued During This Encounter  Medication Reason  . rosuvastatin (CRESTOR) 20 MG tablet Duplicate    Follow-up: Return in about 4 weeks (around 11/22/2015) for follow up diabetes.   [name], MD

## 2015-10-25 NOTE — Telephone Encounter (Signed)
Pt husband called about the medication of metoprolol tartrate (LOPRESSOR) 25 MG tablet which was not at the [facility]. It looks like it was sent to [facility].   Pharmacy is [id number] - [place] [address] [place] - [address].  Call pt husband once it's been sent in @ [phone]. Thank you!

## 2015-10-25 NOTE — Patient Instructions (Addendum)
YOU ARE STILL DEHYDRATED AND YOU MAY HAVE A BLADDER INFECTION  I AM STARTING YOU ON CIPRO FOR YOUR INFECTION  And ZOFRAN FOR NAUSEA   REDUCE YOUR INSULIN DOSE TO 10 UNITS IF YOU ARE SKIPPING A MEAL    You should be eating breakfast , not skipping . Oatmeal and cereal are NOT the best choices:  Here are better breakfast choices for you:  Eggs and meat  No biscuit! Only low carb toast or flatbread  Atkins , EAS AdvantEdge Carb Control,  or Premier Protein Shakes (all available at [employer] in the pharmacy section)  [place] Clap [phone] (frozen breakfast section of  [employer])   Lunch should have a protein (check,  Tuna ,etc) and a salad or 2 vegetables  Dinner:  Same as lunch  We will refer you to the Diabetes Education classes    YOU NEED TO DRINK AT LEAST 48 OUNCES OF WATER DAILY TO KEEP FROM [place].  DO NOT PUT ICE IN YOUR GLASS  YOU CAN ALSO DRINK G2  IT HAS LESS SUGAR

## 2015-10-25 NOTE — Telephone Encounter (Signed)
Attempted to speak with husband to make him aware of the prescription being sent in to [facility] per request.  No answer and no message able to be left. thanks

## 2015-10-25 NOTE — Progress Notes (Signed)
Pre-visit discussion using our clinic review tool. No additional management support is needed unless otherwise documented below in the visit note.  

## 2015-10-25 NOTE — Telephone Encounter (Signed)
Call from the answering service, apparently an antibiotic and a nausea medications were not sent to the pharmacy. Chart reviewed, plan was to send Cipro and zofran. We will send Cipro 500 mg twice a day #6 no refill and Zofran. Will let PCP known.

## 2015-10-26 ENCOUNTER — Telehealth: Payer: Self-pay | Admitting: Internal Medicine

## 2015-10-26 NOTE — Telephone Encounter (Signed)
Skeet Simmer called from [facility] regarding needing to request to move pt visit to next week pt ran out of visits. Lamuel would like to reschedule PT to next week due to pt fallen. Pt not feeling well this week. He can take a verbal? Thank you!   Call pt @ [phone] voicemail can be left. [employer]

## 2015-10-26 NOTE — Telephone Encounter (Signed)
Spoke to [place] and said it was ok to reschedule PT appointment.

## 2015-10-27 LAB — URINE CULTURE
COLONY COUNT: NO GROWTH
Organism ID, Bacteria: NO GROWTH

## 2015-10-28 DIAGNOSIS — N390 Urinary tract infection, site not specified: Secondary | ICD-10-CM | POA: Insufficient documentation

## 2015-10-28 DIAGNOSIS — E860 Dehydration: Secondary | ICD-10-CM | POA: Insufficient documentation

## 2015-10-28 NOTE — Assessment & Plan Note (Signed)
Advised to reduce dose of insulin to 10 units 70/30 rather than suspending completely when not eating.  Diet reviewed, as she remains uncontrolled . Referral to Diabetes education

## 2015-10-28 NOTE — Assessment & Plan Note (Signed)
Recurrent, currently resolved.

## 2015-10-28 NOTE — Assessment & Plan Note (Signed)
Urged to increase her water intake.

## 2015-10-29 ENCOUNTER — Telehealth: Payer: Self-pay | Admitting: Internal Medicine

## 2015-10-29 ENCOUNTER — Telehealth: Payer: Self-pay | Admitting: *Deleted

## 2015-10-29 DIAGNOSIS — E1140 Type 2 diabetes mellitus with diabetic neuropathy, unspecified: Secondary | ICD-10-CM | POA: Diagnosis not present

## 2015-10-29 DIAGNOSIS — E1121 Type 2 diabetes mellitus with diabetic nephropathy: Secondary | ICD-10-CM | POA: Diagnosis not present

## 2015-10-29 DIAGNOSIS — N183 Chronic kidney disease, stage 3 (moderate): Secondary | ICD-10-CM

## 2015-10-29 NOTE — Telephone Encounter (Signed)
I will initiate the nephrology referral.  She has mild kidney disease.  I saw her on Friday.

## 2015-10-29 NOTE — Telephone Encounter (Signed)
Pamela Collier from [facility] stated that patient did not have a good weekend, she had a low blood pressure and just not feeling well.  Pamela Collier requested a call   [phone]

## 2015-10-29 NOTE — Addendum Note (Signed)
Addended by: [name] on: 10/29/2015 12:50 PM   Modules accepted: Orders

## 2015-10-29 NOTE — Telephone Encounter (Signed)
Spoke to [place] from home care called to let doctor Derrel Nip know about how patient was doing.  Patient went to ED last week, patient was told that her kidneys were bad and she needed to be seen by a kidney specialist.  Patient was afraid to say anything to doctor.  [name] said that patient was very stable today on her visit.  [name] has been noticing patient has had more complaints than normal. Please advise.

## 2015-10-29 NOTE — Telephone Encounter (Signed)
Arville Care from [facility] care management needs more information about the fax that that [facility] sent over. cb [phone]

## 2015-10-29 NOTE — Telephone Encounter (Signed)
Pamela Collier was informed of the referral. Pamela Collier understood and had no questions comments or concerns.

## 2015-10-30 NOTE — Telephone Encounter (Signed)
Called [facility] gave patient MRN and DOB.

## 2015-10-31 DIAGNOSIS — E1121 Type 2 diabetes mellitus with diabetic nephropathy: Secondary | ICD-10-CM | POA: Diagnosis not present

## 2015-10-31 DIAGNOSIS — E1140 Type 2 diabetes mellitus with diabetic neuropathy, unspecified: Secondary | ICD-10-CM | POA: Diagnosis not present

## 2015-11-01 DIAGNOSIS — E1140 Type 2 diabetes mellitus with diabetic neuropathy, unspecified: Secondary | ICD-10-CM | POA: Diagnosis not present

## 2015-11-01 DIAGNOSIS — E1121 Type 2 diabetes mellitus with diabetic nephropathy: Secondary | ICD-10-CM | POA: Diagnosis not present

## 2015-11-05 ENCOUNTER — Ambulatory Visit (INDEPENDENT_AMBULATORY_CARE_PROVIDER_SITE_OTHER): Payer: Medicare Other | Admitting: Internal Medicine

## 2015-11-05 ENCOUNTER — Telehealth: Payer: Self-pay

## 2015-11-05 ENCOUNTER — Encounter: Payer: Self-pay | Admitting: Internal Medicine

## 2015-11-05 VITALS — BP 148/58 | HR 53 | Temp 98.2°F | Resp 12 | Ht 67.0 in | Wt 192.5 lb

## 2015-11-05 DIAGNOSIS — E1351 Other specified diabetes mellitus with diabetic peripheral angiopathy without gangrene: Secondary | ICD-10-CM

## 2015-11-05 DIAGNOSIS — E860 Dehydration: Secondary | ICD-10-CM | POA: Diagnosis not present

## 2015-11-05 DIAGNOSIS — E1140 Type 2 diabetes mellitus with diabetic neuropathy, unspecified: Secondary | ICD-10-CM | POA: Diagnosis not present

## 2015-11-05 DIAGNOSIS — IMO0002 Reserved for concepts with insufficient information to code with codable children: Secondary | ICD-10-CM

## 2015-11-05 DIAGNOSIS — I110 Hypertensive heart disease with heart failure: Secondary | ICD-10-CM | POA: Diagnosis not present

## 2015-11-05 DIAGNOSIS — N179 Acute kidney failure, unspecified: Secondary | ICD-10-CM | POA: Diagnosis not present

## 2015-11-05 DIAGNOSIS — K760 Fatty (change of) liver, not elsewhere classified: Secondary | ICD-10-CM

## 2015-11-05 DIAGNOSIS — E1165 Type 2 diabetes mellitus with hyperglycemia: Secondary | ICD-10-CM

## 2015-11-05 DIAGNOSIS — I779 Disorder of arteries and arterioles, unspecified: Secondary | ICD-10-CM | POA: Diagnosis not present

## 2015-11-05 DIAGNOSIS — N189 Chronic kidney disease, unspecified: Secondary | ICD-10-CM

## 2015-11-05 DIAGNOSIS — D6489 Other specified anemias: Secondary | ICD-10-CM

## 2015-11-05 NOTE — Telephone Encounter (Signed)
Not due for A1c either.  Other labs ordered

## 2015-11-05 NOTE — Telephone Encounter (Signed)
Verbal auth given

## 2015-11-05 NOTE — Telephone Encounter (Signed)
Noted! Thank you

## 2015-11-05 NOTE — Telephone Encounter (Signed)
Pt coming for fasting labs 11/06/15. Please place future orders. Thank you.

## 2015-11-05 NOTE — Progress Notes (Signed)
Pre-visit discussion using our clinic review tool. No additional management support is needed unless otherwise documented below in the visit note.  

## 2015-11-05 NOTE — Telephone Encounter (Signed)
Zadie Rhine PT, with [facility] requests verbal orders in regards to continuing physical therapy for 2x weekly for 2 weeks.  He reports patient had a setback on October 23, 2015 and is encouraged strengthening, balance and fall prevention.  Call back number [phone].

## 2015-11-05 NOTE — Patient Instructions (Addendum)
Do not take the lasix daily .  It is dehydrating you.  Take only if you gain 2 lbs overnight ,  Or 5 lbs in one week   Your belly is belly flat,  NOT FLUID   The kidney doctor is either Lateef,  Kollaru or Singh.   When you check out today,  See Micheline Maze about the kidney referral and the diabetes education referral   You can drive YOUR CAR again now that the dizziness has resolved   DO NOT TAKE YOUR INSULIN TOMORROW UNTIL YOU ARE READY TO EAT BREAKFAST,  AFTER YOUR FASTING LABS   RETURN IN AUGUST FOR DIABETES FOLLOW UP

## 2015-11-05 NOTE — Progress Notes (Signed)
Subjective:  Patient ID: Pamela Collier, female    DOB: 11-01-1940  Age: 75 y.o. MRN: [id number]  CC: The primary encounter diagnosis was Dehydration. Diagnoses of Type 2 diabetes mellitus, uncontrolled, with neuropathy ([place]), Hypertensive heart disease with heart failure ([place]), and Acute on chronic kidney failure [facility]) were also pertinent to this visit.  HPI Pamela Collier presents for follow up on type 2 DM and recent UTI with dehydration noted at last visit Jun 15th.  Her symptoms have resolved but she is dismayed at her increased waistline and  feels bloated .  She denies constipation and nausea.  Recent CT reviewed and there was no sign of ascites  Or hepatomegaly on CT of the abdomen done last week  Decreased GFR.  She has not had her appointment with nephrology yet.  She is worried about kidneys failing.  She has been taking lasix daily because the [facility] RN has been putting it with her morning medications.     DM:  She is Taking 44 units of insulin this morning, and 60 units in the evening. 7 of 9 blood sufars in the morning were < 140. [name]    Hypertension She noted that today since she was out of the house all morning paying bills, she delayed taking her morning oral medication until noon and for the first time  in weeks did not feel dizzy.  She would like to resume driving.     Outpatient Prescriptions Prior to Visit  Medication Sig Dispense Refill  . acetaminophen (TYLENOL) 500 MG tablet Take 500 mg by mouth every 6 (six) hours as needed for mild pain or headache.    . albuterol (PROAIR HFA) 108 (90 Base) MCG/ACT inhaler Inhale 2 puffs into the lungs every 6 (six) hours as needed for wheezing or shortness of breath. 1 Inhaler 2  . albuterol (PROVENTIL) (2.5 MG/3ML) 0.083% nebulizer solution Take 3 mLs (2.5 mg total) by nebulization every 6 (six) hours as needed for wheezing or shortness of breath. 150 mL 1  . alendronate (FOSAMAX) 70 MG tablet Take 70 mg by mouth once a week. Pt takes  on Sunday.   Take with a full glass of water on an empty stomach.    . ALPRAZolam (XANAX) 0.5 MG tablet Take 0.5 mg by mouth daily.    [name] aspirin EC 81 MG tablet Take 81 mg by mouth daily.    . benzonatate (TESSALON) 100 MG capsule Take 200 mg by mouth 3 (three) times daily as needed for cough.    . BRILINTA 90 MG TABS tablet Take 1 tablet (90 mg total) by mouth 2 (two) times daily. 180 tablet 2  . budesonide-formoterol (SYMBICORT) 160-4.5 MCG/ACT inhaler Inhale 2 puffs into the lungs 2 (two) times daily. 1 Inhaler 0  . docusate sodium (COLACE) 100 MG capsule Take 100 mg by mouth 2 (two) times daily as needed for mild constipation.    . fluticasone (FLONASE) 50 MCG/ACT nasal spray Place 2 sprays into both nostrils daily as needed for rhinitis.    . furosemide (LASIX) 20 MG tablet Take 1 tablet (20 mg total) by mouth daily. 90 tablet 1  . insulin aspart protamine- aspart (NOVOLOG MIX 70/30) (70-30) 100 UNIT/ML injection Inject 44-60 Units into the skin 2 (two) times daily with a meal. Pt uses 44 units in the morning and 60 units at night.    . Ipratropium-Albuterol (COMBIVENT RESPIMAT) 20-100 MCG/ACT AERS respimat Inhale 1 puff into the lungs 3 (three) times  daily as needed for wheezing or shortness of breath. Reported on 10/25/2015    . isosorbide mononitrate (IMDUR) 30 MG 24 hr tablet Take 30 mg by mouth daily.    [name] losartan (COZAAR) 100 MG tablet Take 1 tablet (100 mg total) by mouth daily. 90 tablet 0  . metoprolol tartrate (LOPRESSOR) 25 MG tablet Take 1 tablet (25 mg total) by mouth 2 (two) times daily. 180 tablet 1  . nitroGLYCERIN (NITROSTAT) 0.4 MG SL tablet Place 1 tablet (0.4 mg total) under the tongue every 5 (five) minutes as needed for chest pain. 25 tablet 3  . ondansetron (ZOFRAN) 8 MG tablet Take 1 tablet (8 mg total) by mouth every 8 (eight) hours as needed for nausea or vomiting. 15 tablet 0  . PARoxetine (PAXIL) 20 MG tablet Take 1 tablet (20 mg total) by mouth daily. 30 tablet 1    . polyethylene glycol (MIRALAX / GLYCOLAX) packet Take 17 g by mouth daily as needed for mild constipation. Reported on 10/25/2015    . pregabalin (LYRICA) 100 MG capsule Take 1 capsule (100 mg total) by mouth 3 (three) times daily. 270 capsule 2  . rosuvastatin (CRESTOR) 20 MG tablet Take 20 mg by mouth at bedtime.    . traMADol (ULTRAM) 50 MG tablet Take 50 mg by mouth every 6 (six) hours as needed for moderate pain.    . Vitamin D, Ergocalciferol, (DRISDOL) 50000 units CAPS capsule Take 50,000 Units by mouth every 7 (seven) days. Pt takes on Sunday.    . ciprofloxacin (CIPRO) 500 MG tablet Take 1 tablet (500 mg total) by mouth 2 (two) times daily. (Patient not taking: Reported on 11/05/2015) 6 tablet 0   Facility-Administered Medications Prior to Visit  Medication Dose Route Frequency Provider Last Rate Last Dose  . albuterol (PROVENTIL) (5 MG/ML) 0.5% nebulizer solution 2.5 mg  2.5 mg Nebulization Once [name], MD        Review of Systems;  Patient denies headache, fevers, malaise, unintentional weight loss, skin rash, eye pain, sinus congestion and sinus pain, sore throat, dysphagia,  hemoptysis , cough, dyspnea, wheezing, chest pain, palpitations, orthopnea, edema, abdominal pain, nausea, melena, diarrhea, constipation, flank pain, dysuria, hematuria, urinary  Frequency, nocturia, numbness, tingling, seizures,  Focal weakness, Loss of consciousness,  Tremor, insomnia, depression, anxiety, and suicidal ideation.      Objective:  BP 148/58 mmHg  Pulse 53  Temp(Src) 98.2 F (36.8 C) (Oral)  Resp 12  Ht 5\' 7"  (1.702 m)  Wt 192 lb 8 oz (87.317 kg)  BMI 30.14 kg/m2  SpO2 98%  BP Readings from Last 3 Encounters:  11/05/15 148/58  10/25/15 168/68  10/23/15 124/54    Wt Readings from Last 3 Encounters:  11/05/15 192 lb 8 oz (87.317 kg)  10/25/15 192 lb 2 oz (87.147 kg)  10/23/15 190 lb (86.183 kg)    General appearance: alert, cooperative and appears stated age Ears:  normal TM's and external ear canals both ears Throat: lips, mucosa, and tongue normal; teeth and gums normal Neck: no adenopathy, no carotid bruit, supple, symmetrical, trachea midline and thyroid not enlarged, symmetric, no tenderness/mass/nodules Back: symmetric, no curvature. ROM normal. No CVA tenderness. Lungs: clear to auscultation bilaterally Heart: regular rate and rhythm, S1, S2 normal, no murmur, click, rub or gallop Abdomen: soft, non-tender; bowel sounds normal; no masses,  no organomegaly Pulses: 2+ and symmetric Skin: Skin color, texture, turgor normal. No rashes or lesions Lymph nodes: Cervical, supraclavicular, and axillary nodes normal.  Lab Results  Component Value Date   HGBA1C 9.6* 09/26/2015   HGBA1C 7.8* 06/08/2015   HGBA1C 7.5* 03/05/2015    Lab Results  Component Value Date   CREATININE 1.53* 10/25/2015   CREATININE 2.05* 10/23/2015   CREATININE 1.91* 10/19/2015    Lab Results  Component Value Date   WBC 7.9 10/23/2015   HGB 11.0* 10/23/2015   HCT 32.4* 10/23/2015   PLT 273 10/23/2015   GLUCOSE 194* 10/25/2015   CHOL 151 06/08/2015   TRIG 148.0 06/08/2015   HDL 31.80* 06/08/2015   LDLDIRECT 73.0 09/26/2015   LDLCALC 89 06/08/2015   ALT 14 10/25/2015   AST 17 10/25/2015   NA 137 10/25/2015   K 4.3 10/25/2015   CL 105 10/25/2015   CREATININE 1.53* 10/25/2015   BUN 27* 10/25/2015   CO2 27 10/25/2015   TSH 1.16 07/01/2013   INR 1.2* 02/02/2015   HGBA1C 9.6* 09/26/2015   MICROALBUR 28.9* 09/26/2015    Dg Chest 2 View  10/23/2015  CLINICAL DATA:  Dizziness and syncope EXAM: CHEST  2 VIEW COMPARISON:  09/12/2015 FINDINGS: Low volume chest with crowding/mild atelectasis, similar to previous. There is no edema, consolidation, effusion, or pneumothorax. Normal heart size and aortic contours. No acute osseous finding. IMPRESSION: Low volume chest without acute finding. Electronically Signed   By: Monte Fantasia M.D.   On: 10/23/2015 15:14   Ct Head  Wo Contrast  10/23/2015  CLINICAL DATA:  Headache with dizziness and syncope. EXAM: CT HEAD WITHOUT CONTRAST TECHNIQUE: Contiguous axial images were obtained from the base of the skull through the vertex without intravenous contrast. COMPARISON:  10/02/2015 FINDINGS: Skull and Sinuses:Negative for fracture or destructive process. Hyperostosis. No sinusitis or mastoiditis. Visualized orbits: Negative. Brain: No evidence of acute infarction, hemorrhage, hydrocephalus, or mass lesion/mass effect. Generalized atrophy that is mild but underestimated due to hyperostosis. IMPRESSION: No acute finding or change from prior. Electronically Signed   By: Monte Fantasia M.D.   On: 10/23/2015 14:54    Assessment & Plan:   Problem List Items Addressed This Visit    Acute on chronic kidney failure ([place])    improving GFR with resolution of dehydration.  Daily lasix stopped,  neprhoilogy referral in process,  Continue ARB.  Avoid NSAIDs,  Lab Results  Component Value Date   CREATININE 1.53* 10/25/2015        Hypertensive heart disease    Her medications have made her feel dizzy when she takes them in the early morning,  So she is changing them to noontime.       Type 2 diabetes mellitus, uncontrolled, with neuropathy ([place] [address])    A Referral to Diabetes education is pending.no changes were made today until she can check more afternoon and evening blood sugars. Continue aspirin, staitn,  ARB  Lab Results  Component Value Date   HGBA1C 9.6* 09/26/2015            Dehydration - Primary    Clinically resolved.  Advised to stop daily use of lasix unless she has weight gain of 2 lbs overnight.  Lab Results  Component Value Date   CREATININE 1.53* 10/25/2015   Lab Results  Component Value Date   NA 137 10/25/2015   K 4.3 10/25/2015   CL 105 10/25/2015   CO2 27 10/25/2015            I am having Ms. Wittke maintain her albuterol, budesonide-formoterol, nitroGLYCERIN, PARoxetine, losartan,  furosemide, BRILINTA, albuterol, pregabalin, acetaminophen, alendronate, ALPRAZolam, benzonatate,  Ipratropium-Albuterol, docusate sodium, Vitamin D (Ergocalciferol), fluticasone, insulin aspart protamine- aspart, isosorbide mononitrate, rosuvastatin, polyethylene glycol, traMADol, aspirin EC, metoprolol tartrate, ciprofloxacin, and ondansetron.  No orders of the defined types were placed in this encounter.    There are no discontinued medications.  Follow-up: No Follow-up on file.   [name], MD

## 2015-11-06 ENCOUNTER — Other Ambulatory Visit (INDEPENDENT_AMBULATORY_CARE_PROVIDER_SITE_OTHER): Payer: Medicare Other

## 2015-11-06 ENCOUNTER — Encounter: Payer: Self-pay | Admitting: Internal Medicine

## 2015-11-06 DIAGNOSIS — K760 Fatty (change of) liver, not elsewhere classified: Secondary | ICD-10-CM

## 2015-11-06 DIAGNOSIS — E1351 Other specified diabetes mellitus with diabetic peripheral angiopathy without gangrene: Secondary | ICD-10-CM | POA: Diagnosis not present

## 2015-11-06 DIAGNOSIS — D6489 Other specified anemias: Secondary | ICD-10-CM | POA: Diagnosis not present

## 2015-11-06 LAB — CBC WITH DIFFERENTIAL/PLATELET
Basophils Absolute: 0 10*3/uL (ref 0.0–0.1)
Basophils Relative: 0.6 % (ref 0.0–3.0)
EOS PCT: 1.6 % (ref 0.0–5.0)
Eosinophils Absolute: 0.1 10*3/uL (ref 0.0–0.7)
HCT: 34.6 % — ABNORMAL LOW (ref 36.0–46.0)
Hemoglobin: 11.5 g/dL — ABNORMAL LOW (ref 12.0–15.0)
LYMPHS ABS: 1.3 10*3/uL (ref 0.7–4.0)
Lymphocytes Relative: 18.3 % (ref 12.0–46.0)
MCHC: 33.4 g/dL (ref 30.0–36.0)
MCV: 88.8 fl (ref 78.0–100.0)
MONO ABS: 0.6 10*3/uL (ref 0.1–1.0)
MONOS PCT: 8.3 % (ref 3.0–12.0)
NEUTROS ABS: 5.2 10*3/uL (ref 1.4–7.7)
NEUTROS PCT: 71.2 % (ref 43.0–77.0)
PLATELETS: 305 10*3/uL (ref 150.0–400.0)
RBC: 3.9 Mil/uL (ref 3.87–5.11)
RDW: 14.7 % (ref 11.5–15.5)
WBC: 7.4 10*3/uL (ref 4.0–10.5)

## 2015-11-06 LAB — LIPID PANEL
CHOL/HDL RATIO: 4
CHOLESTEROL: 135 mg/dL (ref 0–200)
HDL: 31.9 mg/dL — AB (ref >39.00)
LDL Cholesterol: 74 mg/dL (ref 0–99)
NonHDL: 102.67
Triglycerides: 143 mg/dL (ref 0.0–149.0)
VLDL: 28.6 mg/dL (ref 0.0–40.0)

## 2015-11-06 LAB — COMPREHENSIVE METABOLIC PANEL
ALBUMIN: 3.6 g/dL (ref 3.5–5.2)
ALT: 13 U/L (ref 0–35)
AST: 14 U/L (ref 0–37)
Alkaline Phosphatase: 51 U/L (ref 39–117)
BUN: 36 mg/dL — ABNORMAL HIGH (ref 6–23)
CALCIUM: 9.4 mg/dL (ref 8.4–10.5)
CHLORIDE: 107 meq/L (ref 96–112)
CO2: 27 mEq/L (ref 19–32)
Creatinine, Ser: 1.49 mg/dL — ABNORMAL HIGH (ref 0.40–1.20)
GFR: 43.81 mL/min — AB (ref >60.00)
Glucose, Bld: 172 mg/dL — ABNORMAL HIGH (ref 70–99)
POTASSIUM: 4.3 meq/L (ref 3.5–5.1)
SODIUM: 137 meq/L (ref 135–145)
Total Bilirubin: 0.6 mg/dL (ref 0.2–1.2)
Total Protein: 7.5 g/dL (ref 6.0–8.3)

## 2015-11-06 NOTE — Assessment & Plan Note (Signed)
A Referral to Diabetes education is pending.no changes were made today until she can check more afternoon and evening blood sugars. Continue aspirin, staitn,  ARB  Lab Results  Component Value Date   HGBA1C 9.6* 09/26/2015

## 2015-11-06 NOTE — Assessment & Plan Note (Signed)
Clinically resolved.  Advised to stop daily use of lasix unless she has weight gain of 2 lbs overnight.  Lab Results  Component Value Date   CREATININE 1.53* 10/25/2015   Lab Results  Component Value Date   NA 137 10/25/2015   K 4.3 10/25/2015   CL 105 10/25/2015   CO2 27 10/25/2015

## 2015-11-06 NOTE — Assessment & Plan Note (Signed)
Her medications have made her feel dizzy when she takes them in the early morning,  So she is changing them to noontime.

## 2015-11-06 NOTE — Assessment & Plan Note (Signed)
improving GFR with resolution of dehydration.  Daily lasix stopped,  neprhoilogy referral in process,  Continue ARB.  Avoid NSAIDs,  Lab Results  Component Value Date   CREATININE 1.53* 10/25/2015

## 2015-11-07 DIAGNOSIS — E1140 Type 2 diabetes mellitus with diabetic neuropathy, unspecified: Secondary | ICD-10-CM | POA: Diagnosis not present

## 2015-11-07 DIAGNOSIS — E1121 Type 2 diabetes mellitus with diabetic nephropathy: Secondary | ICD-10-CM | POA: Diagnosis not present

## 2015-11-09 ENCOUNTER — Other Ambulatory Visit: Payer: Self-pay | Admitting: *Deleted

## 2015-11-09 DIAGNOSIS — E1121 Type 2 diabetes mellitus with diabetic nephropathy: Secondary | ICD-10-CM | POA: Diagnosis not present

## 2015-11-09 DIAGNOSIS — E1140 Type 2 diabetes mellitus with diabetic neuropathy, unspecified: Secondary | ICD-10-CM | POA: Diagnosis not present

## 2015-11-11 ENCOUNTER — Encounter: Payer: Self-pay | Admitting: *Deleted

## 2015-11-11 NOTE — Patient Outreach (Signed)
[facility]) Care Management   Home visit 11/09/15  Pamela Collier 07/24/1940 [id number]  Pamela Collier is an 75 y.o. female  Subjective: Pt reports on dizziness,vomiting a few weeks ago, f/u with MD, started taking all of her medications (except insulin) at 12:30pm, feeling better. Pt reports on ED visit- falls, dehydrated, staying well hydrated.  Pt reports she did get her walker.  Pt reports HH PT is coming twice a week, numbness in legs better.  Pt reports she did get her CPAP supplies shortly after RN CM's last home visit, compliant with CPAP daily, has more energy.  Pt reports her sugar this am was in 300's (view of pt's reading- 316), took her insulin late last night plus had an egg roll.  Pt reports to f/u with Kidney MD 8/1.    Objective:   Filed Vitals:   11/09/15 1240  BP: 140/60  Pulse: 54  Resp: 16    ROS  Physical Exam  Constitutional: She is oriented to person, place, and time. She appears well-developed and well-nourished.  Cardiovascular: Regular rhythm.   HR 54.   Respiratory: Effort normal and breath sounds normal.  GI: Soft. Bowel sounds are normal.  Musculoskeletal: Normal range of motion.  Uses walker with seat as needed.   Neurological: She is alert and oriented to person, place, and time.  Skin: Skin is warm and dry.  Psychiatric: She has a normal mood and affect. Her behavior is normal. Judgment and thought content normal.    Encounter Medications:  Reviewed with pt  Outpatient Encounter Prescriptions as of 11/09/2015  Medication Sig Note  . acetaminophen (TYLENOL) 500 MG tablet Take 500 mg by mouth every 6 (six) hours as needed for mild pain or headache. 11/09/2015: As needed.   [name] albuterol (PROAIR HFA) 108 (90 Base) MCG/ACT inhaler Inhale 2 puffs into the lungs every 6 (six) hours as needed for wheezing or shortness of breath.   [name] albuterol (PROVENTIL) (2.5 MG/3ML) 0.083% nebulizer solution Take 3 mLs (2.5 mg total) by nebulization  every 6 (six) hours as needed for wheezing or shortness of breath.   [name] alendronate (FOSAMAX) 70 MG tablet Take 70 mg by mouth once a week. Pt takes on Sunday.   Take with a full glass of water on an empty stomach.   . ALPRAZolam (XANAX) 0.5 MG tablet Take 0.5 mg by mouth daily.   [name] aspirin EC 81 MG tablet Take 81 mg by mouth daily.   [name] BRILINTA 90 MG TABS tablet Take 1 tablet (90 mg total) by mouth 2 (two) times daily.   . budesonide-formoterol (SYMBICORT) 160-4.5 MCG/ACT inhaler Inhale 2 puffs into the lungs 2 (two) times daily.   . fluticasone (FLONASE) 50 MCG/ACT nasal spray Place 2 sprays into both nostrils daily as needed for rhinitis.   [name] insulin aspart protamine- aspart (NOVOLOG MIX 70/30) (70-30) 100 UNIT/ML injection Inject 44-60 Units into the skin 2 (two) times daily with a meal. Pt uses 44 units in the morning and 60 units at night.   . Ipratropium-Albuterol (COMBIVENT RESPIMAT) 20-100 MCG/ACT AERS respimat Inhale 1 puff into the lungs 3 (three) times daily as needed for wheezing or shortness of breath. Reported on 10/25/2015   . isosorbide mononitrate (IMDUR) 30 MG 24 hr tablet Take 30 mg by mouth daily.   [name] losartan (COZAAR) 100 MG tablet Take 1 tablet (100 mg total) by mouth daily.   . metoprolol tartrate (LOPRESSOR) 25 MG tablet Take 1 tablet (  25 mg total) by mouth 2 (two) times daily.   . nitroGLYCERIN (NITROSTAT) 0.4 MG SL tablet Place 1 tablet (0.4 mg total) under the tongue every 5 (five) minutes as needed for chest pain.   [name] PARoxetine (PAXIL) 20 MG tablet Take 1 tablet (20 mg total) by mouth daily.   . polyethylene glycol (MIRALAX / GLYCOLAX) packet Take 17 g by mouth daily as needed for mild constipation. Reported on 10/25/2015   . pregabalin (LYRICA) 100 MG capsule Take 1 capsule (100 mg total) by mouth 3 (three) times daily.   . rosuvastatin (CRESTOR) 20 MG tablet Take 20 mg by mouth at bedtime.   . traMADol (ULTRAM) 50 MG tablet Take 50 mg by mouth every 6 (six) hours as  needed for moderate pain. 11/09/2015: As needed.   . Vitamin D, Ergocalciferol, (DRISDOL) 50000 units CAPS capsule Take 50,000 Units by mouth every 7 (seven) days. Pt takes on Sunday.   . benzonatate (TESSALON) 100 MG capsule Take 200 mg by mouth 3 (three) times daily as needed for cough. Reported on 11/09/2015 11/09/2015: Have available if needed.   . ciprofloxacin (CIPRO) 500 MG tablet Take 1 tablet (500 mg total) by mouth 2 (two) times daily. (Patient not taking: Reported on 11/05/2015) 11/09/2015: Completed.   . docusate sodium (COLACE) 100 MG capsule Take 100 mg by mouth 2 (two) times daily as needed for mild constipation. Reported on 11/09/2015   . furosemide (LASIX) 20 MG tablet Take 1 tablet (20 mg total) by mouth daily. (Patient not taking: Reported on 11/09/2015) 11/09/2015: To take for weight gain of 2 lbs overnight, 5 lbs in a week.   . ondansetron (ZOFRAN) 8 MG tablet Take 1 tablet (8 mg total) by mouth every 8 (eight) hours as needed for nausea or vomiting. (Patient not taking: Reported on 11/09/2015)    Facility-Administered Encounter Medications as of 11/09/2015  Medication  . albuterol (PROVENTIL) (5 MG/ML) 0.5% nebulizer solution 2.5 mg    Functional Status:   In your present state of health, do you have any difficulty performing the following activities: 10/10/2015 07/16/2015  Hearing? N N  Vision? N N  Difficulty concentrating or making decisions? N N  Walking or climbing stairs? Y Y  Dressing or bathing? N N  Doing errands, shopping? Y N  Preparing Food and eating ? Y Y  Using the Toilet? N N  In the past six months, have you accidently leaked urine? N N  Do you have problems with loss of bowel control? N N  Managing your Medications? N N  Managing your Finances? N N  Housekeeping or managing your Housekeeping? N N    Fall/Depression Screening:    PHQ 2/9 Scores 10/10/2015 10/30/2014 10/30/2014 10/20/2014 07/23/2014 11/22/2013 08/03/2013  PHQ - 2 Score 1 0 0 3 1 2  0  PHQ- 9 Score -  - - 5 - 3 -  Exception Documentation - - - - Other- indicate reason in comment box - -    Assessment:  Pleasant 75 year old woman, resides with spouse.  No c/o pain, dizziness.                          Pt has walker with seat close by, ambulates short distances without any problems.                         DM- view of pt's recording readings 110-205 in am,  316 this am, 104-237 pm.                                                      Plan:  Pt to continue to check sugars, record, bring results to next MD visit            Pt to f/u with Kidney MD 81 (as reported by pt).            Pt to continue to work with [facility] PT, use walker as needed.             Plan to continue to provide community nurse case management services, f/u again 7/28.            Plan to inform Dr. Derrel Nip of [facility] involvement, send by in basket 6/30 home visit encounter.   THN CM Care Plan Problem One        Most Recent Value   Care Plan Problem One  DM- recent elevated sugars    Role Documenting the Problem One  Care Management Coordinator   Care Plan for Problem One  Active   THN Long Term Goal (31-90 days)  Pt sugars would not be over 200 in the next 40 days    THN Long Term Goal Start Date  11/09/15   Interventions for Problem One Long Term Goal  Reviewed with pt importance of having protein with snacks, meals.      THN CM Short Term Goal #1 (0-30 days)  Pt would manage portion sizes in the next 30 days    THN CM Short Term Goal #1 Start Date  10/10/15   [facility] CM Short Term Goal #1 Met Date  11/09/15 Barrie Folk met. ]   THN CM Short Term Goal #2 (0-30 days)  Pt would know 3 signs of hypoglycemia in the next 30 days,    THN CM Short Term Goal #2 Start Date  11/09/15   Interventions for Short Term Goal #2  With pt increasing her exercise, provided pt with information on hypoglycemia, reviewed s/s, what to do as pt is increasing her exercise.     THN CM Care Plan Problem Two        Most Recent Value   THN CM Short Term Goal #1  (0-30 days)  Pt would receive CPAP supplies in the next 7 days    THN CM Short Term Goal #1 Start Date  10/10/15   [facility] CM Short Term Goal #1 Met Date   -- [not assessed in time frame- 6/30- pt reports got in 2 days ]     Zara Chess.   [facility]  [phone]

## 2015-11-12 NOTE — Telephone Encounter (Signed)
Pt lvm asking for Pamela Collier to give her a call. She did not leave any further details.

## 2015-11-14 ENCOUNTER — Ambulatory Visit: Payer: Medicare Other | Admitting: Internal Medicine

## 2015-11-14 ENCOUNTER — Telehealth: Payer: Self-pay | Admitting: *Deleted

## 2015-11-14 DIAGNOSIS — E1140 Type 2 diabetes mellitus with diabetic neuropathy, unspecified: Secondary | ICD-10-CM | POA: Diagnosis not present

## 2015-11-14 DIAGNOSIS — E1121 Type 2 diabetes mellitus with diabetic nephropathy: Secondary | ICD-10-CM | POA: Diagnosis not present

## 2015-11-14 NOTE — Telephone Encounter (Signed)
Spoke with Mariann Laster, they have re faxed 3 forms for this patient that need to be signed by the provider.  Please advise if these were received.  If so please completed and send back. thanks

## 2015-11-14 NOTE — Telephone Encounter (Signed)
[facility] Lemmings stated that they have a outstanding order for the patients Home health certification and plan of care  [facility] contact [phone]

## 2015-11-14 NOTE — Telephone Encounter (Signed)
Faxed paper work fax date was 11/10/15 not in office received on Monday MD signed today.

## 2015-11-16 ENCOUNTER — Other Ambulatory Visit: Payer: Self-pay | Admitting: Internal Medicine

## 2015-11-16 DIAGNOSIS — E1140 Type 2 diabetes mellitus with diabetic neuropathy, unspecified: Secondary | ICD-10-CM | POA: Diagnosis not present

## 2015-11-16 DIAGNOSIS — E1121 Type 2 diabetes mellitus with diabetic nephropathy: Secondary | ICD-10-CM | POA: Diagnosis not present

## 2015-11-20 ENCOUNTER — Telehealth: Payer: Self-pay | Admitting: Internal Medicine

## 2015-11-20 MED ORDER — INSULIN ASPART PROT & ASPART (70-30 MIX) 100 UNIT/ML PEN: PEN_INJECTOR | SUBCUTANEOUS | Status: DC

## 2015-11-20 NOTE — Telephone Encounter (Signed)
Pt called needing a refill for insulin aspart protamine- aspart (NOVOLOG MIX 70/30) (70-30) 100 UNIT/ML injection  Pharmacy is [place], [place]  Call pt @ [phone]. Thank you!

## 2015-11-20 NOTE — Telephone Encounter (Signed)
Spoke with patient to clarify which novolog she wanted refilled, flex pen or vials.  She confirmed flex pen.  Sent to pharmacy. thanks

## 2015-11-21 NOTE — Telephone Encounter (Signed)
Mailed unread message to patient.  

## 2015-11-23 DIAGNOSIS — E1140 Type 2 diabetes mellitus with diabetic neuropathy, unspecified: Secondary | ICD-10-CM | POA: Diagnosis not present

## 2015-11-23 DIAGNOSIS — E1121 Type 2 diabetes mellitus with diabetic nephropathy: Secondary | ICD-10-CM | POA: Diagnosis not present

## 2015-11-26 ENCOUNTER — Telehealth: Payer: Self-pay | Admitting: Internal Medicine

## 2015-11-26 NOTE — Telephone Encounter (Signed)
Started PA for novolog 70/30 [facility] Case number [id number].

## 2015-11-27 MED ORDER — INSULIN LISPRO PROT & LISPRO (75-25 MIX) 100 UNIT/ML KWIKPEN: PEN_INJECTOR | SUBCUTANEOUS | Status: DC

## 2015-11-27 NOTE — Telephone Encounter (Signed)
HUMALOG KWIK PEN RX SENT TO [facility] . FIRST RX WAS WRONG BC IT WAS ONLY FOR 30 DAYS,  SENT SECOND RX FOR 90 DAYS .  PLEASE CALL THEM TO STOP THE 30 DAY RX .  THE 90 DAY RX IS FOR 8 BOXES OF PENS

## 2015-11-27 NOTE — Addendum Note (Signed)
Addended by: [name] on: 11/27/2015 08:58 AM   Modules accepted: Orders, Medications

## 2015-11-27 NOTE — Telephone Encounter (Signed)
PA for Novolog mix 70-30 pen was denied due to patient not have tried Humalog.    Please advise, thanks

## 2015-11-27 NOTE — Telephone Encounter (Signed)
[facility],  [name] She is in the process of canceling the first prescription, thanks, Only the 90 day will be filled.

## 2015-11-27 NOTE — Telephone Encounter (Signed)
Patient insurance will not pay for novolog must be humalog 75/25. PA denied.

## 2015-11-28 DIAGNOSIS — E1121 Type 2 diabetes mellitus with diabetic nephropathy: Secondary | ICD-10-CM | POA: Diagnosis not present

## 2015-11-28 DIAGNOSIS — E1140 Type 2 diabetes mellitus with diabetic neuropathy, unspecified: Secondary | ICD-10-CM | POA: Diagnosis not present

## 2015-11-29 NOTE — Telephone Encounter (Signed)
Pt called stating that she only has 1 pen left for today and no pen for tomorrow. Please advise?  Call pt @ [phone]. Thank you!

## 2015-11-29 NOTE — Telephone Encounter (Signed)
The prescription was sent, can you advise her to call [facility] and see if they have sent her medications out yet, if not we can send a small supply to a local pharmacy of her choice. thanks

## 2015-11-30 NOTE — Telephone Encounter (Signed)
Noted, thanks!

## 2015-11-30 NOTE — Telephone Encounter (Signed)
OK. Pt medication arrived late yesterday. Thank you!

## 2015-12-03 ENCOUNTER — Encounter: Payer: Medicare Other | Attending: Internal Medicine | Admitting: Dietician

## 2015-12-03 ENCOUNTER — Encounter: Payer: Self-pay | Admitting: Dietician

## 2015-12-03 VITALS — Ht 67.0 in | Wt 193.0 lb

## 2015-12-03 DIAGNOSIS — E1165 Type 2 diabetes mellitus with hyperglycemia: Secondary | ICD-10-CM | POA: Insufficient documentation

## 2015-12-03 DIAGNOSIS — E1121 Type 2 diabetes mellitus with diabetic nephropathy: Secondary | ICD-10-CM

## 2015-12-03 DIAGNOSIS — Z794 Long term (current) use of insulin: Secondary | ICD-10-CM | POA: Diagnosis not present

## 2015-12-03 NOTE — Patient Instructions (Addendum)
   Try Minute Rice Multi-grain medley.   Consider trying lactaid tablets, which you can take before drinking a nutrition drink, or other milk product.   Check food labels for sodium, and keep the total sodium for each meal to 500mg  on average.   Eat a small meal or snack every 3-5 hours.   Keep up your healthy food choices, you are doing a great job!

## 2015-12-03 NOTE — Progress Notes (Signed)
Medical Nutrition Therapy: Visit start time: 0930  end time: 1040  Assessment:  Diagnosis: Diabetes, stage 3 kidney disease Past medical history: CAD, cardiomyopathy, HTN, sleep apnea, fatty liver, osteoporosis Psychosocial issues/ stress concerns: history of anxiety, depression; takes medication Preferred learning method:  . No preference indicated  Current weight: 193lbs  Height: 5'7" Medications, supplements: reviewed list in chart with patient.  Progress and evaluation: Patient reports improving BGs, Fasting BG of 95 this morning. She reports following low sodium diet, and no concentrated sweets. She feels she might be overeating starchy foods, but has made changes to control her intake.   Recent labs indicate potassium, phosphate levels within normal limits.   Physical activity: physical therapy exercises for 30 minutes, 5 times per week  Dietary Intake:  Usual eating pattern includes 2-3 meals and 1-2 snacks per day. Dining out frequency: 2-3 meals per week.  Breakfast: 1 slice toast or 2 slices thin bread, egg with cheese, often with [place] sausage or [place] bacon, fruit (cantaloupe, berries, small banana, etc.) Snack: none Lunch: 1-2pm, light meal: tomato sandwich; sometimes skips lunch when not feeling well (dizzy) Snack: sometimes peanut butter crackers, or 1/2 premier protein drink Supper: vegetables like greens, okra, broccoli, carrots, squash, sometimes with meat (baked, broiled); occ hamburger, likes some rice or potato, pinto beans in small portions.  Snack: graham cracker Beverages: water, some sugar free beverages  Nutrition Care Education: Topics covered: Diabetes, kidney disease Basic nutrition: basic food groups, appropriate nutrient balance, appropriate meal and snack schedule   Weight control: benefits of weight control, behavioral changes for weight loss: portion control Advanced nutrition: food label reading Diabetes: appropriate carb intake and  balance Kidney disease:  importance of controlling BP, identifying high sodium foods, importance of adequate but not excessive protein; potential need for limiting additional foods in the future.   Nutritional Diagnosis:  [place]-2.1 Inpaired nutrition utilization As related to sodium, potassium, phosphorus.  As evidenced by stage 3 kidney disease. [place]-3.3 Overweight/obesity As related to hisotry of inactivity, excess calories.  As evidenced by patient report.  Intervention: Instruction as noted above.   Used food models, menu ideas, and food labels for basic meal planning   Her meals seem to be adequate in carbohydrate, occasionally low in protein; but she does drink a protein supplement drink daily.    Set goals with patient input.    Will call patient after appointment with Dr. Candiss Norse to check on progress, schedule RD follow-up if needed.      Education Materials given:  . Plate Planner with food lists . Sample meal pattern/ menus: Quick and Healthy Meal Ideas . Goals/ instructions  Learner/ who was taught:  . Patient   Level of understanding: [name] Verbalizes/ demonstrates competency  Demonstrated degree of understanding via:   Teach back Learning barriers: . None  Willingness to learn/ readiness for change: . Eager, change in progress  Monitoring and Evaluation:  Dietary intake, exercise, BG control, kidney health, and body weight      follow up: prn

## 2015-12-07 ENCOUNTER — Other Ambulatory Visit: Payer: Self-pay | Admitting: *Deleted

## 2015-12-07 NOTE — Patient Outreach (Signed)
Follow up phone call to pt (clarify Metoprolol Tartrate dosage).   Spoke with pt, HIPAA verified.   RN CM informed pt f/u with Dr. Lupita Dawn nurse Lavella Lemons to clarify correct Metoprolol Tartrate dosage, was informed dosage was changed 6/9 to 25 mg bid when new rx called into pharmacy.   Also informed pt her BP was elevated several times at MD office visits.    Pt voiced understanding that she is to now take Metoprolol Tartrate 25 mg bid.   Plan to follow up again with pt telephonically next week.   Pamela Collier.   [place] [facility]  [phone]

## 2015-12-07 NOTE — Patient Outreach (Signed)
Call made to  Dr. Lupita Dawn office - clarify  pt's Metoprolol Tartrate dosage.  Spoke with Lavella Lemons (MD's nurse), relayed to nurse - during  home visit today with pt, medication review was done which shows pt is to take Metoprolol Tartrate 25 mg bid but pt was insistent she has always taken 25 mg (1/2 tablet, total 12.5mg ) bid.   Review of pt's MD office visits, Lavella Lemons reports pt was on the half dosage but medication was changed 6/9 to 25 mg bid when new rx called in-  BP elevated on several visits.   RN CM to relay this to pt, update medication profile.     Zara Chess.   [place] [facility]  [phone]

## 2015-12-09 NOTE — Patient Outreach (Signed)
[facility]) Care Management   Home visit 12/07/15  Pamela Collier 1940/07/02 [id number]  Pamela Collier is an 75 y.o. female  Subjective:  Pt reports she completed her diabetic classes this week, given meal plans.   Pt reports [place] RN  And PT, both finished last week.   Pt reports HH PT putting weights on her legs really helped, numbness gone.  Pt reports to f/u with Kidney MD 8/2, follow up from 6/19 ED visit.  Pt reports she has been dealing with right arm Pain for the past 2-3 months, (feels like something crawling up her arm), Tylenol and rest help, no c/o pain today.  Pt reports she has not told Dr. Derrel Nip but will call her to schedule visit.      Objective:   Vitals:   12/07/15 1149  BP: (!) 112/58  Pulse: (!) 58  Resp: 16    ROS  Physical Exam  Constitutional: She is oriented to person, place, and time. She appears well-developed and well-nourished.  Cardiovascular: Regular rhythm.   HR 58  Respiratory: Effort normal and breath sounds normal.  GI: Soft. Bowel sounds are normal.  Musculoskeletal: She exhibits no edema.  Neurological: She is alert and oriented to person, place, and time.  Skin: Skin is warm and dry.  Psychiatric: She has a normal mood and affect. Her behavior is normal. Judgment and thought content normal.    Encounter Medications:   Outpatient Encounter Prescriptions as of 12/07/2015  Medication Sig Note  . acetaminophen (TYLENOL) 500 MG tablet Take 500 mg by mouth every 6 (six) hours as needed for mild pain or headache. 12/07/2015: As needed.   [name] albuterol (PROAIR HFA) 108 (90 Base) MCG/ACT inhaler Inhale 2 puffs into the lungs every 6 (six) hours as needed for wheezing or shortness of breath. 12/07/2015: As needed.   [name] alendronate (FOSAMAX) 70 MG tablet Take 70 mg by mouth once a week. Pt takes on Sunday.   Take with a full glass of water on an empty stomach.   . ALPRAZolam (XANAX) 0.5 MG tablet Take 0.5 mg by mouth daily.   [name]  aspirin EC 81 MG tablet Take 81 mg by mouth daily.   . benzonatate (TESSALON) 100 MG capsule Take 200 mg by mouth 3 (three) times daily as needed for cough. Reported on 11/09/2015 12/07/2015: As needed.   [name] BRILINTA 90 MG TABS tablet Take 1 tablet (90 mg total) by mouth 2 (two) times daily.   . budesonide-formoterol (SYMBICORT) 160-4.5 MCG/ACT inhaler Inhale 2 puffs into the lungs 2 (two) times daily.   [name] docusate sodium (COLACE) 100 MG capsule Take 100 mg by mouth 2 (two) times daily as needed for mild constipation. Reported on 11/09/2015 12/07/2015: As needed.   . fluticasone (FLONASE) 50 MCG/ACT nasal spray Place 2 sprays into both nostrils daily as needed for rhinitis. 12/07/2015: As needed.   . Insulin Lispro Prot & Lispro (HUMALOG MIX 75/25 KWIKPEN) (75-25) 100 UNIT/ML Kwikpen Inject 45 to 60 units twice daily before morning and evening meals   . Ipratropium-Albuterol (COMBIVENT RESPIMAT) 20-100 MCG/ACT AERS respimat Inhale 1 puff into the lungs 3 (three) times daily as needed for wheezing or shortness of breath. Reported on 10/25/2015 12/07/2015: As needed.   . isosorbide mononitrate (IMDUR) 30 MG 24 hr tablet Take 30 mg by mouth daily.   [name] losartan (COZAAR) 100 MG tablet Take 1 tablet (100 mg total) by mouth daily.   . metoprolol tartrate (LOPRESSOR) 25  MG tablet Take 1 tablet (25 mg total) by mouth 2 (two) times daily. 12/07/2015: Per MD's nurse, pt to take 25 mg bid.    [name] PARoxetine (PAXIL) 20 MG tablet Take 1 tablet (20 mg total) by mouth daily.   . polyethylene glycol (MIRALAX / GLYCOLAX) packet Take 17 g by mouth daily as needed for mild constipation. Reported on 10/25/2015 12/07/2015: As needed.   . pregabalin (LYRICA) 100 MG capsule Take 1 capsule (100 mg total) by mouth 3 (three) times daily.   . rosuvastatin (CRESTOR) 20 MG tablet Take 20 mg by mouth at bedtime.   . Vitamin D, Ergocalciferol, (DRISDOL) 50000 units CAPS capsule Take 50,000 Units by mouth every 7 (seven) days. Pt takes on Sunday.    [name] albuterol (PROVENTIL) (2.5 MG/3ML) 0.083% nebulizer solution Take 3 mLs (2.5 mg total) by nebulization every 6 (six) hours as needed for wheezing or shortness of breath. (Patient not taking: Reported on 12/07/2015)   . B-D UF III MINI PEN NEEDLES 31G X 5 MM MISC  12/03/2015: Received from: External Pharmacy  . ciprofloxacin (CIPRO) 500 MG tablet Take 1 tablet (500 mg total) by mouth 2 (two) times daily. (Patient not taking: Reported on 11/05/2015) 11/09/2015: Completed.   . furosemide (LASIX) 20 MG tablet Take 1 tablet (20 mg total) by mouth daily. (Patient not taking: Reported on 11/09/2015) 12/07/2015: Pt to take for weight gain of 3 lbs in a day, to take   . nitroGLYCERIN (NITROSTAT) 0.4 MG SL tablet Place 1 tablet (0.4 mg total) under the tongue every 5 (five) minutes as needed for chest pain. (Patient not taking: Reported on 12/07/2015)   . ondansetron (ZOFRAN) 8 MG tablet Take 1 tablet (8 mg total) by mouth every 8 (eight) hours as needed for nausea or vomiting. (Patient not taking: Reported on 11/09/2015)   . traMADol (ULTRAM) 50 MG tablet Take 50 mg by mouth every 6 (six) hours as needed for moderate pain. 11/09/2015: As needed.    Facility-Administered Encounter Medications as of 12/07/2015  Medication  . albuterol (PROVENTIL) (5 MG/ML) 0.5% nebulizer solution 2.5 mg    Functional Status:   In your present state of health, do you have any difficulty performing the following activities: 10/10/2015 07/16/2015  Hearing? N N  Vision? N N  Difficulty concentrating or making decisions? N N  Walking or climbing stairs? Y Y  Dressing or bathing? N N  Doing errands, shopping? Y N  Preparing Food and eating ? Y Y  Using the Toilet? N N  In the past six months, have you accidently leaked urine? N N  Do you have problems with loss of bowel control? N N  Managing your Medications? N N  Managing your Finances? N N  Housekeeping or managing your Housekeeping? N N  Some recent data might be hidden     Fall/Depression Screening:    PHQ 2/9 Scores 12/03/2015 10/10/2015 10/30/2014 10/30/2014 10/20/2014 07/23/2014 11/22/2013  PHQ - 2 Score 0 1 0 0 _0 PHQ- 9 Score - - - - 5 - 3  Exception Documentation - - - - - Other- indicate reason in comment box -    Assessment:  Pleasant 30 year woman, resides with spouse.                           Right arm status- per pt been dealing with right arm pain (achey) past 2-3 months, no  Pain currently.                           DM-  Sugar today 92, ranges 92-320.                           Medication review- dosage for Metoprolol Tartrate needs to be clarified.  Pt reports to                              Take 25 mg (1/2 tablet 12.5 mg bid) but  medication profile shows 25 mg bid.    Plan:  RN CM to call Dr. Lupita Dawn nurse to clarify pt's Metoprolol dosage.              Pt to continue to checks sugars, record, exercise, compliance with diabetic diet.             Plan to  follow up with pt again next week- final phone call.    [facility] CM Care Plan Problem One   Flowsheet Row Most Recent Value  Care Plan Problem One  DM- recent elevated sugars   Role Documenting the Problem One  Care Management Coordinator  Care Plan for Problem One  Active  [facility] Long Term Goal (31-90 days)  Pt sugars would not be over 200 in the next 40 days   [facility] Long Term Goal Start Date  11/09/15  Interventions for Problem One Long Term Goal  Reinforced use of meal plans provided to pt in diabetic class   [facility] CM Short Term Goal #1 (0-30 days)  Pt would continue to exercise 4 times a week within the next 7 days   [facility] CM Short Term Goal #1 Start Date  12/07/15  Interventions for Short Term Goal #1  Encouraged pt to continue to do the exercises provided by [facility] PT  [facility] CM Short Term Goal #2 (0-30 days)  Pt would know 3 signs of hypoglycemia in the next 30 days   [facility] CM Short Term Goal #2 Start Date  11/09/15  [facility] CM Short Term Goal #2 Met Date  12/07/15     Zara Chess.   [place] [facility]  [phone]

## 2015-12-11 DIAGNOSIS — R809 Proteinuria, unspecified: Secondary | ICD-10-CM | POA: Diagnosis not present

## 2015-12-11 DIAGNOSIS — N183 Chronic kidney disease, stage 3 (moderate): Secondary | ICD-10-CM | POA: Diagnosis not present

## 2015-12-11 DIAGNOSIS — I129 Hypertensive chronic kidney disease with stage 1 through stage 4 chronic kidney disease, or unspecified chronic kidney disease: Secondary | ICD-10-CM | POA: Diagnosis not present

## 2015-12-11 DIAGNOSIS — E1122 Type 2 diabetes mellitus with diabetic chronic kidney disease: Secondary | ICD-10-CM | POA: Diagnosis not present

## 2015-12-13 LAB — CBC AND DIFFERENTIAL
HCT: 35 % — AB (ref 36–46)
Hemoglobin: 11.3 g/dL — AB (ref 12.0–16.0)
NEUTROS ABS: 5 /uL
PLATELETS: 309 10*3/uL (ref 150–399)
WBC: 8.6 10*3/mL

## 2015-12-13 LAB — BASIC METABOLIC PANEL
BUN: 27 mg/dL — AB (ref 4–21)
Creatinine: 1.4 mg/dL — AB (ref 0.5–1.1)
GLUCOSE: 43 mg/dL
Potassium: 4.3 mmol/L (ref 3.4–5.3)
SODIUM: 141 mmol/L (ref 137–147)

## 2015-12-13 LAB — HEMOGLOBIN A1C: Hemoglobin A1C: 7.7

## 2015-12-14 ENCOUNTER — Encounter: Payer: Self-pay | Admitting: *Deleted

## 2015-12-14 ENCOUNTER — Other Ambulatory Visit: Payer: Self-pay | Admitting: *Deleted

## 2015-12-14 NOTE — Patient Outreach (Signed)
Telephone call successful.   Spoke with pt, HIPAA verified.  Pt reports since last home visit sugars have been under 200 except one day when she ate something late and in am sugar was 201.   Pt reports trying to exercise 4 days a week for 20 minutes but does not always make the 20 minutes.  Pt reports feels better.  RN CM discussed today is final call, plan to close her case as goals met to which pt agreed.     Plan:   Send Dr. Tullo by in basket case closure letter. Plan to inform [facility] care management assistant to close case.      M.    RN CCM [facility]  [phone]  

## 2015-12-17 ENCOUNTER — Telehealth: Payer: Self-pay | Admitting: Dietician

## 2015-12-17 NOTE — Telephone Encounter (Signed)
Called Pamela Collier to check on progress; she has had recent labwork done, with no changes in her diet recommendations. She states she is eating more regularly. She has been checking some food labels for sodium, and remembers to keep total sodium intake to 500mg  average per meal. Did not schedule follow-up at this time, but encouraged patient to call as needed.

## 2015-12-19 ENCOUNTER — Other Ambulatory Visit: Payer: Self-pay | Admitting: Internal Medicine

## 2015-12-20 ENCOUNTER — Telehealth: Payer: Self-pay | Admitting: *Deleted

## 2015-12-20 NOTE — Telephone Encounter (Signed)
Are you willing to work her in? If so either 1130 or 430? thanks

## 2015-12-20 NOTE — Telephone Encounter (Signed)
Pt would like to see Dr. Derrel Nip only on 08/11 for a possible ear infection . Please give a time to schedule pt.

## 2015-12-20 NOTE — Telephone Encounter (Signed)
11:30 tomorrow

## 2015-12-20 NOTE — Telephone Encounter (Signed)
Spoke with patient and scheduled for tomorrow, thanks

## 2015-12-21 ENCOUNTER — Encounter: Payer: Self-pay | Admitting: Internal Medicine

## 2015-12-21 ENCOUNTER — Ambulatory Visit (INDEPENDENT_AMBULATORY_CARE_PROVIDER_SITE_OTHER): Payer: Medicare Other | Admitting: Internal Medicine

## 2015-12-21 DIAGNOSIS — I779 Disorder of arteries and arterioles, unspecified: Secondary | ICD-10-CM | POA: Diagnosis not present

## 2015-12-21 DIAGNOSIS — H6501 Acute serous otitis media, right ear: Secondary | ICD-10-CM

## 2015-12-21 MED ORDER — LEVOFLOXACIN 500 MG PO TABS
500.0000 mg | ORAL_TABLET | Freq: Every day | ORAL | 0 refills | Status: DC
Start: 2015-12-21 — End: 2016-01-15

## 2015-12-21 MED ORDER — PREDNISONE 10 MG PO TABS: ORAL_TABLET | ORAL | 0 refills | Status: DC

## 2015-12-21 NOTE — Patient Instructions (Addendum)
I am treating you for an ear infection caused by sinus congestion    I am prescribing an antibiotic (levaquin) and a prednisone taper  To manage the infection and the inflammation in your ear/sinuses.   I also advise use of the following OTC meds to help with your other symptoms.:\   Sudafed PE  10 to 30 mg every 8 hours for the congestion, you may substitute Afrin nasal spray for the nighttime dose of sudafed PE  If needed to prevent insomnia.  flush your sinuses twice daily with Simply Saline (do over the sink because if you do it right you will spit out globs of mucus)   Please take a probiotic ( Align, Floraque or Culturelle), or eat a serving of yogurt  EVERY DAY FOR 2 WEEKS  to prevent a serious antibiotic associated diarrhea  Called clostridium dificile colitis.  Taking a probiotic may also prevent vaginitis due to yeast infections and can be continued indefinitely if you feel that it improves your digestion or your elimination (bowels).  RETURN AFTER AUG 18TH FOR DIABETES CHECKUP

## 2015-12-21 NOTE — Progress Notes (Signed)
Pre visit review using our clinic review tool, if applicable. No additional management support is needed unless otherwise documented below in the visit note. 

## 2015-12-21 NOTE — Progress Notes (Signed)
Subjective:  Patient ID: Pamela Collier, female    DOB: 1941/03/05  Age: 75 y.o. MRN: [id number]  CC: The encounter diagnosis was Right acute serous otitis media, recurrence not specified.  HPI Pamela Collier presents for evaluation of right ear pain accompanied by headache that started 48 hours ago. Some sinus drainage , no sore throat or fevers .  Has not flown recently or done any swimming. Denies discharge from ear.     Outpatient Medications Prior to Visit  Medication Sig Dispense Refill  . acetaminophen (TYLENOL) 500 MG tablet Take 500 mg by mouth every 6 (six) hours as needed for mild pain or headache.    . albuterol (PROAIR HFA) 108 (90 Base) MCG/ACT inhaler Inhale 2 puffs into the lungs every 6 (six) hours as needed for wheezing or shortness of breath. 1 Inhaler 2  . albuterol (PROVENTIL) (2.5 MG/3ML) 0.083% nebulizer solution Take 3 mLs (2.5 mg total) by nebulization every 6 (six) hours as needed for wheezing or shortness of breath. 150 mL 1  . alendronate (FOSAMAX) 70 MG tablet Take 70 mg by mouth once a week. Pt takes on Sunday.   Take with a full glass of water on an empty stomach.    . ALPRAZolam (XANAX) 0.5 MG tablet Take 0.5 mg by mouth daily.    [name] aspirin EC 81 MG tablet Take 81 mg by mouth daily.    . B-D UF III MINI PEN NEEDLES 31G X 5 MM MISC     . benzonatate (TESSALON) 100 MG capsule Take 200 mg by mouth 3 (three) times daily as needed for cough. Reported on 11/09/2015    . BRILINTA 90 MG TABS tablet Take 1 tablet (90 mg total) by mouth 2 (two) times daily. 180 tablet 2  . budesonide-formoterol (SYMBICORT) 160-4.5 MCG/ACT inhaler Inhale 2 puffs into the lungs 2 (two) times daily. 1 Inhaler 0  . docusate sodium (COLACE) 100 MG capsule Take 100 mg by mouth 2 (two) times daily as needed for mild constipation. Reported on 11/09/2015    . fluticasone (FLONASE) 50 MCG/ACT nasal spray Place 2 sprays into both nostrils daily as needed for rhinitis.    . furosemide (LASIX) 20  MG tablet Take 1 tablet (20 mg total) by mouth daily. 90 tablet 1  . Insulin Lispro Prot & Lispro (HUMALOG MIX 75/25 KWIKPEN) (75-25) 100 UNIT/ML Kwikpen Inject 45 to 60 units twice daily before morning and evening meals 112 mL 3  . Ipratropium-Albuterol (COMBIVENT RESPIMAT) 20-100 MCG/ACT AERS respimat Inhale 1 puff into the lungs 3 (three) times daily as needed for wheezing or shortness of breath. Reported on 10/25/2015    . isosorbide mononitrate (IMDUR) 30 MG 24 hr tablet Take 30 mg by mouth daily.    [name] losartan (COZAAR) 100 MG tablet Take 1 tablet (100 mg total) by mouth daily. 90 tablet 0  . losartan (COZAAR) 100 MG tablet TAKE 1 TABLET DAILY 90 tablet 1  . metoprolol tartrate (LOPRESSOR) 25 MG tablet Take 1 tablet (25 mg total) by mouth 2 (two) times daily. 180 tablet 1  . nitroGLYCERIN (NITROSTAT) 0.4 MG SL tablet Place 1 tablet (0.4 mg total) under the tongue every 5 (five) minutes as needed for chest pain. 25 tablet 3  . ondansetron (ZOFRAN) 8 MG tablet Take 1 tablet (8 mg total) by mouth every 8 (eight) hours as needed for nausea or vomiting. 15 tablet 0  . PARoxetine (PAXIL) 20 MG tablet Take 1 tablet (20 mg total)  by mouth daily. 30 tablet 1  . polyethylene glycol (MIRALAX / GLYCOLAX) packet Take 17 g by mouth daily as needed for mild constipation. Reported on 10/25/2015    . pregabalin (LYRICA) 100 MG capsule Take 1 capsule (100 mg total) by mouth 3 (three) times daily. 270 capsule 2  . rosuvastatin (CRESTOR) 20 MG tablet Take 20 mg by mouth at bedtime.    . traMADol (ULTRAM) 50 MG tablet Take 50 mg by mouth every 6 (six) hours as needed for moderate pain.    . Vitamin D, Ergocalciferol, (DRISDOL) 50000 units CAPS capsule Take 50,000 Units by mouth every 7 (seven) days. Pt takes on Sunday.    . ciprofloxacin (CIPRO) 500 MG tablet Take 1 tablet (500 mg total) by mouth 2 (two) times daily. (Patient not taking: Reported on 12/21/2015) 6 tablet 0   Facility-Administered Medications Prior to  Visit  Medication Dose Route Frequency Provider Last Rate Last Dose  . albuterol (PROVENTIL) (5 MG/ML) 0.5% nebulizer solution 2.5 mg  2.5 mg Nebulization Once [name], MD        Review of Systems;  Patient denies , fevers, malaise, unintentional weight loss, skin rash, eye pain,  sore throat, dysphagia,  hemoptysis , cough, dyspnea, wheezing, chest pain, palpitations, orthopnea, edema, abdominal pain, nausea, melena, diarrhea, constipation, flank pain, dysuria, hematuria, urinary  Frequency, nocturia, numbness, tingling, seizures,  Focal weakness, Loss of consciousness,  Tremor, insomnia, depression, anxiety, and suicidal ideation.      Objective:  BP 118/60 (BP Location: Right Arm, Patient Position: Sitting, Cuff Size: Normal)   Pulse (!) 57   Temp 98.2 F (36.8 C) (Oral)   Ht 5\' 7"  (1.702 m)   Wt 190 lb 6.4 oz (86.4 kg)   SpO2 98%   BMI 29.82 kg/m   BP Readings from Last 3 Encounters:  12/21/15 118/60  12/07/15 (!) 112/58  11/09/15 140/60    Wt Readings from Last 3 Encounters:  12/21/15 190 lb 6.4 oz (86.4 kg)  12/07/15 186 lb (84.4 kg)  12/03/15 193 lb (87.5 kg)    General appearance: alert, cooperative and appears stated age Ears: right TM with serous effusion,  No erythema.  Left TM normal  Throat: lips, mucosa, and tongue normal; teeth and gums normal Neck: no adenopathy, no carotid bruit, supple, symmetrical, trachea midline and thyroid not enlarged, symmetric, no tenderness/mass/nodules Lungs:  CTA bilaterally.  Heart: regular rate and rhythm, S1, S2 normal, no murmur, click, rub or gallop  Lymph nodes: Cervical, supraclavicular, and axillary nodes normal.  Lab Results  Component Value Date   HGBA1C 9.6 (H) 09/26/2015   HGBA1C 7.8 (H) 06/08/2015   HGBA1C 7.5 (H) 03/05/2015    Lab Results  Component Value Date   CREATININE 1.49 (H) 11/06/2015   CREATININE 1.53 (H) 10/25/2015   CREATININE 2.05 (H) 10/23/2015    Lab Results  Component Value Date     WBC 7.4 11/06/2015   HGB 11.5 (L) 11/06/2015   HCT 34.6 (L) 11/06/2015   PLT 305.0 11/06/2015   GLUCOSE 172 (H) 11/06/2015   CHOL 135 11/06/2015   TRIG 143.0 11/06/2015   HDL 31.90 (L) 11/06/2015   LDLDIRECT 73.0 09/26/2015   LDLCALC 74 11/06/2015   ALT 13 11/06/2015   AST 14 11/06/2015   NA 137 11/06/2015   K 4.3 11/06/2015   CL 107 11/06/2015   CREATININE 1.49 (H) 11/06/2015   BUN 36 (H) 11/06/2015   CO2 27 11/06/2015   TSH 1.16 07/01/2013  INR 1.2 (H) 02/02/2015   HGBA1C 9.6 (H) 09/26/2015   MICROALBUR 28.9 (H) 09/26/2015    Dg Chest 2 View  Result Date: 10/23/2015 CLINICAL DATA:  Dizziness and syncope EXAM: CHEST  2 VIEW COMPARISON:  09/12/2015 FINDINGS: Low volume chest with crowding/mild atelectasis, similar to previous. There is no edema, consolidation, effusion, or pneumothorax. Normal heart size and aortic contours. No acute osseous finding. IMPRESSION: Low volume chest without acute finding. Electronically Signed   By: Monte Fantasia M.D.   On: 10/23/2015 15:14   Ct Head Wo Contrast  Result Date: 10/23/2015 CLINICAL DATA:  Headache with dizziness and syncope. EXAM: CT HEAD WITHOUT CONTRAST TECHNIQUE: Contiguous axial images were obtained from the base of the skull through the vertex without intravenous contrast. COMPARISON:  10/02/2015 FINDINGS: Skull and Sinuses:Negative for fracture or destructive process. Hyperostosis. No sinusitis or mastoiditis. Visualized orbits: Negative. Brain: No evidence of acute infarction, hemorrhage, hydrocephalus, or mass lesion/mass effect. Generalized atrophy that is mild but underestimated due to hyperostosis. IMPRESSION: No acute finding or change from prior. Electronically Signed   By: Monte Fantasia M.D.   On: 10/23/2015 14:54    Assessment & Plan:   Problem List Items Addressed This Visit    Otitis media    Secondary to sinous congestion.  Antibiotics, prednisone taper.       Relevant Medications   levofloxacin (LEVAQUIN)  500 MG tablet    Other Visit Diagnoses   None.     I have discontinued Ms. Leifer ciprofloxacin. I am also having her start on levofloxacin and predniSONE. Additionally, I am having her maintain her albuterol, budesonide-formoterol, nitroGLYCERIN, PARoxetine, losartan, furosemide, BRILINTA, albuterol, pregabalin, acetaminophen, alendronate, ALPRAZolam, benzonatate, Ipratropium-Albuterol, docusate sodium, Vitamin D (Ergocalciferol), fluticasone, isosorbide mononitrate, rosuvastatin, polyethylene glycol, traMADol, aspirin EC, metoprolol tartrate, ondansetron, Insulin Lispro Prot & Lispro, B-D UF III MINI PEN NEEDLES, and losartan.  Meds ordered this encounter  Medications  . levofloxacin (LEVAQUIN) 500 MG tablet    Sig: Take 1 tablet (500 mg total) by mouth daily.    Dispense:  7 tablet    Refill:  0  . predniSONE (DELTASONE) 10 MG tablet    Sig: 6 tablets on Day 1 , then reduce by 1 tablet daily until gone    Dispense:  21 tablet    Refill:  0    Medications Discontinued During This Encounter  Medication Reason  . ciprofloxacin (CIPRO) 500 MG tablet Completed Course    Follow-up: Return in about 7 days (around 12/28/2015), or OR AFTER FOR DIABETES CHECKUP , for follow up diabetes.   [name], MD

## 2015-12-23 DIAGNOSIS — H6690 Otitis media, unspecified, unspecified ear: Secondary | ICD-10-CM | POA: Insufficient documentation

## 2015-12-23 NOTE — Assessment & Plan Note (Signed)
Secondary to sinous congestion.  Antibiotics, prednisone taper.

## 2015-12-28 ENCOUNTER — Telehealth: Payer: Self-pay

## 2015-12-28 NOTE — Telephone Encounter (Signed)
Pt is on the lab schedule 12/31/15 for an A1c. Patient had an A1c done two weeks ago by another provider. Okay to take patient off schedule for Monday; or were there other labs needed?

## 2015-12-28 NOTE — Telephone Encounter (Signed)
Thank you for noticing tha.  She does NOT need labs on Monday

## 2015-12-28 NOTE — Telephone Encounter (Signed)
Attempted to call patient. Unable to reach. Left VM informing patient she does not need to come to lab appointment on Monday.

## 2015-12-31 ENCOUNTER — Other Ambulatory Visit: Payer: Medicare Other

## 2015-12-31 ENCOUNTER — Telehealth: Payer: Self-pay | Admitting: Internal Medicine

## 2015-12-31 NOTE — Telephone Encounter (Signed)
Pamela Collier called saying she never received a phone call with he most recent lab results. She'd like a phone call regarding this.  Pt's ph# [phone] Thank you.

## 2015-12-31 NOTE — Telephone Encounter (Signed)
Patient still after explaining for several times that no labs were drawn on 12/21/15, that she  had labs during office visit that even a urine was attained.  Patient concern is for A1c, Patient is to bring in [facility] log.

## 2015-12-31 NOTE — Telephone Encounter (Signed)
Pamela Collier it does not look like Dr. Derrel Nip has seen these or wrote anything about it.

## 2016-01-02 ENCOUNTER — Observation Stay (HOSPITAL_BASED_OUTPATIENT_CLINIC_OR_DEPARTMENT_OTHER)
Admit: 2016-01-02 | Discharge: 2016-01-02 | Disposition: A | Discharge status: Home / Self Care | Payer: Medicare Other | Attending: Cardiovascular Disease | Admitting: Cardiovascular Disease

## 2016-01-02 ENCOUNTER — Observation Stay
Admission: EM | Admit: 2016-01-02 | Discharge: 2016-01-02 | Disposition: A | Discharge status: Home / Self Care | Payer: Medicare Other | Attending: Internal Medicine | Admitting: Internal Medicine

## 2016-01-02 ENCOUNTER — Emergency Department: Payer: Medicare Other

## 2016-01-02 ENCOUNTER — Encounter: Payer: Self-pay | Admitting: Emergency Medicine

## 2016-01-02 ENCOUNTER — Telehealth: Payer: Self-pay | Admitting: *Deleted

## 2016-01-02 DIAGNOSIS — I130 Hypertensive heart and chronic kidney disease with heart failure and stage 1 through stage 4 chronic kidney disease, or unspecified chronic kidney disease: Secondary | ICD-10-CM | POA: Diagnosis not present

## 2016-01-02 DIAGNOSIS — I739 Peripheral vascular disease, unspecified: Secondary | ICD-10-CM | POA: Diagnosis not present

## 2016-01-02 DIAGNOSIS — R809 Proteinuria, unspecified: Secondary | ICD-10-CM | POA: Diagnosis not present

## 2016-01-02 DIAGNOSIS — Z9889 Other specified postprocedural states: Secondary | ICD-10-CM | POA: Insufficient documentation

## 2016-01-02 DIAGNOSIS — N183 Chronic kidney disease, stage 3 (moderate): Secondary | ICD-10-CM | POA: Insufficient documentation

## 2016-01-02 DIAGNOSIS — Z9049 Acquired absence of other specified parts of digestive tract: Secondary | ICD-10-CM | POA: Insufficient documentation

## 2016-01-02 DIAGNOSIS — E785 Hyperlipidemia, unspecified: Secondary | ICD-10-CM | POA: Diagnosis not present

## 2016-01-02 DIAGNOSIS — I2510 Atherosclerotic heart disease of native coronary artery without angina pectoris: Secondary | ICD-10-CM

## 2016-01-02 DIAGNOSIS — Z90710 Acquired absence of both cervix and uterus: Secondary | ICD-10-CM | POA: Insufficient documentation

## 2016-01-02 DIAGNOSIS — Z882 Allergy status to sulfonamides status: Secondary | ICD-10-CM | POA: Insufficient documentation

## 2016-01-02 DIAGNOSIS — Z89421 Acquired absence of other right toe(s): Secondary | ICD-10-CM | POA: Insufficient documentation

## 2016-01-02 DIAGNOSIS — Z7902 Long term (current) use of antithrombotics/antiplatelets: Secondary | ICD-10-CM | POA: Diagnosis not present

## 2016-01-02 DIAGNOSIS — D860 Sarcoidosis of lung: Secondary | ICD-10-CM | POA: Insufficient documentation

## 2016-01-02 DIAGNOSIS — R0602 Shortness of breath: Secondary | ICD-10-CM

## 2016-01-02 DIAGNOSIS — Z955 Presence of coronary angioplasty implant and graft: Secondary | ICD-10-CM | POA: Insufficient documentation

## 2016-01-02 DIAGNOSIS — I200 Unstable angina: Secondary | ICD-10-CM

## 2016-01-02 DIAGNOSIS — I10 Essential (primary) hypertension: Secondary | ICD-10-CM

## 2016-01-02 DIAGNOSIS — Z7982 Long term (current) use of aspirin: Secondary | ICD-10-CM | POA: Insufficient documentation

## 2016-01-02 DIAGNOSIS — J449 Chronic obstructive pulmonary disease, unspecified: Secondary | ICD-10-CM | POA: Insufficient documentation

## 2016-01-02 DIAGNOSIS — N189 Chronic kidney disease, unspecified: Secondary | ICD-10-CM

## 2016-01-02 DIAGNOSIS — I255 Ischemic cardiomyopathy: Secondary | ICD-10-CM | POA: Diagnosis not present

## 2016-01-02 DIAGNOSIS — E1151 Type 2 diabetes mellitus with diabetic peripheral angiopathy without gangrene: Secondary | ICD-10-CM | POA: Diagnosis not present

## 2016-01-02 DIAGNOSIS — M1990 Unspecified osteoarthritis, unspecified site: Secondary | ICD-10-CM | POA: Diagnosis not present

## 2016-01-02 DIAGNOSIS — Z8249 Family history of ischemic heart disease and other diseases of the circulatory system: Secondary | ICD-10-CM | POA: Insufficient documentation

## 2016-01-02 DIAGNOSIS — D869 Sarcoidosis, unspecified: Secondary | ICD-10-CM | POA: Diagnosis not present

## 2016-01-02 DIAGNOSIS — IMO0002 Reserved for concepts with insufficient information to code with codable children: Secondary | ICD-10-CM

## 2016-01-02 DIAGNOSIS — I129 Hypertensive chronic kidney disease with stage 1 through stage 4 chronic kidney disease, or unspecified chronic kidney disease: Secondary | ICD-10-CM | POA: Diagnosis not present

## 2016-01-02 DIAGNOSIS — I5042 Chronic combined systolic (congestive) and diastolic (congestive) heart failure: Secondary | ICD-10-CM | POA: Insufficient documentation

## 2016-01-02 DIAGNOSIS — Z8673 Personal history of transient ischemic attack (TIA), and cerebral infarction without residual deficits: Secondary | ICD-10-CM | POA: Diagnosis not present

## 2016-01-02 DIAGNOSIS — Z7951 Long term (current) use of inhaled steroids: Secondary | ICD-10-CM | POA: Insufficient documentation

## 2016-01-02 DIAGNOSIS — F418 Other specified anxiety disorders: Secondary | ICD-10-CM | POA: Insufficient documentation

## 2016-01-02 DIAGNOSIS — E1165 Type 2 diabetes mellitus with hyperglycemia: Secondary | ICD-10-CM

## 2016-01-02 DIAGNOSIS — E1159 Type 2 diabetes mellitus with other circulatory complications: Secondary | ICD-10-CM | POA: Diagnosis not present

## 2016-01-02 DIAGNOSIS — Z794 Long term (current) use of insulin: Secondary | ICD-10-CM | POA: Insufficient documentation

## 2016-01-02 DIAGNOSIS — Z79899 Other long term (current) drug therapy: Secondary | ICD-10-CM | POA: Diagnosis not present

## 2016-01-02 DIAGNOSIS — E1121 Type 2 diabetes mellitus with diabetic nephropathy: Secondary | ICD-10-CM | POA: Insufficient documentation

## 2016-01-02 DIAGNOSIS — I25110 Atherosclerotic heart disease of native coronary artery with unstable angina pectoris: Secondary | ICD-10-CM | POA: Diagnosis not present

## 2016-01-02 DIAGNOSIS — R079 Chest pain, unspecified: Secondary | ICD-10-CM | POA: Diagnosis not present

## 2016-01-02 DIAGNOSIS — Z888 Allergy status to other drugs, medicaments and biological substances status: Secondary | ICD-10-CM | POA: Insufficient documentation

## 2016-01-02 DIAGNOSIS — I429 Cardiomyopathy, unspecified: Secondary | ICD-10-CM | POA: Diagnosis not present

## 2016-01-02 DIAGNOSIS — E1122 Type 2 diabetes mellitus with diabetic chronic kidney disease: Secondary | ICD-10-CM | POA: Diagnosis not present

## 2016-01-02 DIAGNOSIS — I5030 Unspecified diastolic (congestive) heart failure: Secondary | ICD-10-CM | POA: Diagnosis not present

## 2016-01-02 DIAGNOSIS — Z803 Family history of malignant neoplasm of breast: Secondary | ICD-10-CM | POA: Insufficient documentation

## 2016-01-02 DIAGNOSIS — K760 Fatty (change of) liver, not elsewhere classified: Secondary | ICD-10-CM | POA: Diagnosis not present

## 2016-01-02 DIAGNOSIS — E1129 Type 2 diabetes mellitus with other diabetic kidney complication: Secondary | ICD-10-CM

## 2016-01-02 DIAGNOSIS — Z825 Family history of asthma and other chronic lower respiratory diseases: Secondary | ICD-10-CM | POA: Insufficient documentation

## 2016-01-02 DIAGNOSIS — Z91041 Radiographic dye allergy status: Secondary | ICD-10-CM | POA: Insufficient documentation

## 2016-01-02 LAB — CBC
HCT: 32 % — ABNORMAL LOW (ref 35.0–47.0)
HCT: 31 % — ABNORMAL LOW (ref 35.0–47.0)
HEMOGLOBIN: 11.1 g/dL — AB (ref 12.0–16.0)
Hemoglobin: 10.6 g/dL — ABNORMAL LOW (ref 12.0–16.0)
MCH: 30.7 pg (ref 26.0–34.0)
MCH: 30.6 pg (ref 26.0–34.0)
MCHC: 34.6 g/dL (ref 32.0–36.0)
MCHC: 34.3 g/dL (ref 32.0–36.0)
MCV: 88.9 fL (ref 80.0–100.0)
MCV: 89.3 fL (ref 80.0–100.0)
PLATELETS: 202 10*3/uL (ref 150–440)
PLATELETS: 198 10*3/uL (ref 150–440)
RBC: 3.6 MIL/uL — AB (ref 3.80–5.20)
RBC: 3.48 MIL/uL — AB (ref 3.80–5.20)
RDW: 15.5 % — ABNORMAL HIGH (ref 11.5–14.5)
RDW: 15.3 % — AB (ref 11.5–14.5)
WBC: 7.7 10*3/uL (ref 3.6–11.0)
WBC: 7.6 10*3/uL (ref 3.6–11.0)

## 2016-01-02 LAB — ECHOCARDIOGRAM COMPLETE
HEIGHTINCHES: 67 in
WEIGHTICAEL: 3078.4 [oz_av]

## 2016-01-02 LAB — BASIC METABOLIC PANEL
Anion gap: 6 (ref 5–15)
Anion gap: 5 (ref 5–15)
BUN: 31 mg/dL — AB (ref 6–20)
BUN: 29 mg/dL — AB (ref 6–20)
CALCIUM: 8.8 mg/dL — AB (ref 8.9–10.3)
CHLORIDE: 113 mmol/L — AB (ref 101–111)
CO2: 22 mmol/L (ref 22–32)
CO2: 22 mmol/L (ref 22–32)
CREATININE: 1.6 mg/dL — AB (ref 0.44–1.00)
CREATININE: 1.44 mg/dL — AB (ref 0.44–1.00)
Calcium: 9 mg/dL (ref 8.9–10.3)
Chloride: 112 mmol/L — ABNORMAL HIGH (ref 101–111)
GFR calc Af Amer: 35 mL/min — ABNORMAL LOW (ref >60)
GFR calc non Af Amer: 30 mL/min — ABNORMAL LOW (ref >60)
GFR, EST AFRICAN AMERICAN: 40 mL/min — AB (ref >60)
GFR, EST NON AFRICAN AMERICAN: 35 mL/min — AB (ref >60)
GLUCOSE: 156 mg/dL — AB (ref 65–99)
Glucose, Bld: 145 mg/dL — ABNORMAL HIGH (ref 65–99)
Potassium: 3.7 mmol/L (ref 3.5–5.1)
Potassium: 4 mmol/L (ref 3.5–5.1)
Sodium: 141 mmol/L (ref 135–145)
Sodium: 139 mmol/L (ref 135–145)

## 2016-01-02 LAB — LIPID PANEL
CHOL/HDL RATIO: 4.2 ratio
CHOLESTEROL: 123 mg/dL (ref 0–200)
HDL: 29 mg/dL — ABNORMAL LOW (ref >40)
LDL Cholesterol: 64 mg/dL (ref 0–99)
Triglycerides: 151 mg/dL — ABNORMAL HIGH (ref <150)
VLDL: 30 mg/dL (ref 0–40)

## 2016-01-02 LAB — TROPONIN I
TROPONIN I: 0.03 ng/mL — AB (ref <0.03)
TROPONIN I: 0.03 ng/mL — AB (ref <0.03)

## 2016-01-02 LAB — GLUCOSE, CAPILLARY
GLUCOSE-CAPILLARY: 180 mg/dL — AB (ref 65–99)
Glucose-Capillary: 139 mg/dL — ABNORMAL HIGH (ref 65–99)

## 2016-01-02 MED ORDER — SODIUM CHLORIDE 0.9% FLUSH: 3.0000 mL | INTRAVENOUS | Status: DC | PRN

## 2016-01-02 MED ORDER — FLUTICASONE PROPIONATE 50 MCG/ACT NA SUSP: 2.0000 | Freq: Every day | NASAL | Status: DC | PRN

## 2016-01-02 MED ORDER — ROSUVASTATIN CALCIUM 20 MG PO TABS: 20.0000 mg | ORAL_TABLET | Freq: Every day | ORAL | Status: DC

## 2016-01-02 MED ORDER — ASPIRIN EC 81 MG PO TBEC: 81.0000 mg | DELAYED_RELEASE_TABLET | Freq: Every day | ORAL | Status: DC

## 2016-01-02 MED ORDER — MORPHINE SULFATE (PF) 2 MG/ML IV SOLN: 2.0000 mg | INTRAVENOUS | Status: DC | PRN

## 2016-01-02 MED ORDER — ASPIRIN EC 81 MG PO TBEC
81.0000 mg | DELAYED_RELEASE_TABLET | Freq: Every day | ORAL | Status: DC
Start: 2016-01-03 — End: 2016-01-02

## 2016-01-02 MED ORDER — VITAMIN D (ERGOCALCIFEROL) 1.25 MG (50000 UNIT) PO CAPS: 50000.0000 [IU] | ORAL_CAPSULE | ORAL | Status: DC

## 2016-01-02 MED ORDER — ACETAMINOPHEN 500 MG PO TABS: 500.0000 mg | ORAL_TABLET | Freq: Four times a day (QID) | ORAL | Status: DC | PRN

## 2016-01-02 MED ORDER — MORPHINE SULFATE (PF) 2 MG/ML IV SOLN
2.0000 mg | Freq: Once | INTRAVENOUS | Status: AC
  Administered 2016-01-02: 2 mg via INTRAVENOUS
  Filled 2016-01-02: qty 1

## 2016-01-02 MED ORDER — ONDANSETRON HCL 4 MG/2ML IJ SOLN
4.0000 mg | Freq: Once | INTRAMUSCULAR | Status: AC
  Administered 2016-01-02: 4 mg via INTRAVENOUS
  Filled 2016-01-02: qty 2

## 2016-01-02 MED ORDER — GABAPENTIN 100 MG PO CAPS: 100.0000 mg | ORAL_CAPSULE | Freq: Every day | ORAL | 0 refills | Status: DC

## 2016-01-02 MED ORDER — ALBUTEROL SULFATE (2.5 MG/3ML) 0.083% IN NEBU: 2.5000 mg | INHALATION_SOLUTION | Freq: Four times a day (QID) | RESPIRATORY_TRACT | Status: DC | PRN

## 2016-01-02 MED ORDER — POLYETHYLENE GLYCOL 3350 17 G PO PACK: 17.0000 g | PACK | Freq: Every day | ORAL | Status: DC | PRN

## 2016-01-02 MED ORDER — DOCUSATE SODIUM 100 MG PO CAPS: 100.0000 mg | ORAL_CAPSULE | Freq: Two times a day (BID) | ORAL | Status: DC | PRN

## 2016-01-02 MED ORDER — ASPIRIN 81 MG PO CHEW: 324.0000 mg | CHEWABLE_TABLET | ORAL | Status: DC

## 2016-01-02 MED ORDER — TICAGRELOR 90 MG PO TABS
90.0000 mg | ORAL_TABLET | Freq: Two times a day (BID) | ORAL | Status: DC
  Administered 2016-01-02: 90 mg via ORAL
  Filled 2016-01-02: qty 1

## 2016-01-02 MED ORDER — METOPROLOL TARTRATE 25 MG PO TABS: 12.5000 mg | ORAL_TABLET | Freq: Two times a day (BID) | ORAL | Status: DC

## 2016-01-02 MED ORDER — ASPIRIN 300 MG RE SUPP: 300.0000 mg | RECTAL | Status: DC

## 2016-01-02 MED ORDER — IPRATROPIUM-ALBUTEROL 0.5-2.5 (3) MG/3ML IN SOLN: 3.0000 mL | Freq: Three times a day (TID) | RESPIRATORY_TRACT | Status: DC | PRN

## 2016-01-02 MED ORDER — FUROSEMIDE 20 MG PO TABS
20.0000 mg | ORAL_TABLET | Freq: Every day | ORAL | Status: DC
  Administered 2016-01-02: 20 mg via ORAL
  Filled 2016-01-02: qty 1

## 2016-01-02 MED ORDER — PAROXETINE HCL 20 MG PO TABS
20.0000 mg | ORAL_TABLET | Freq: Every day | ORAL | Status: DC
  Administered 2016-01-02: 20 mg via ORAL
  Filled 2016-01-02: qty 1

## 2016-01-02 MED ORDER — ALBUTEROL SULFATE HFA 108 (90 BASE) MCG/ACT IN AERS: 2.0000 | INHALATION_SPRAY | Freq: Four times a day (QID) | RESPIRATORY_TRACT | Status: DC | PRN

## 2016-01-02 MED ORDER — ISOSORBIDE MONONITRATE ER 30 MG PO TB24
30.0000 mg | ORAL_TABLET | Freq: Every day | ORAL | Status: DC
  Administered 2016-01-02: 30 mg via ORAL
  Filled 2016-01-02 (×2): qty 1

## 2016-01-02 MED ORDER — METOPROLOL TARTRATE 25 MG PO TABS
25.0000 mg | ORAL_TABLET | Freq: Two times a day (BID) | ORAL | Status: DC
  Administered 2016-01-02: 25 mg via ORAL
  Filled 2016-01-02: qty 1

## 2016-01-02 MED ORDER — SODIUM CHLORIDE 0.9 % IV SOLN: 250.0000 mL | INTRAVENOUS | Status: DC | PRN

## 2016-01-02 MED ORDER — BENZONATATE 100 MG PO CAPS: 200.0000 mg | ORAL_CAPSULE | Freq: Three times a day (TID) | ORAL | Status: DC | PRN

## 2016-01-02 MED ORDER — SODIUM CHLORIDE 0.9% FLUSH
3.0000 mL | Freq: Two times a day (BID) | INTRAVENOUS | Status: DC
  Administered 2016-01-02: 3 mL via INTRAVENOUS

## 2016-01-02 MED ORDER — ENOXAPARIN SODIUM 40 MG/0.4ML ~~LOC~~ SOLN: 40.0000 mg | SUBCUTANEOUS | Status: DC

## 2016-01-02 MED ORDER — TRAMADOL HCL 50 MG PO TABS
50.0000 mg | ORAL_TABLET | Freq: Four times a day (QID) | ORAL | Status: DC | PRN
  Administered 2016-01-02: 50 mg via ORAL
  Filled 2016-01-02: qty 1

## 2016-01-02 MED ORDER — NITROGLYCERIN 0.4 MG SL SUBL: 0.4000 mg | SUBLINGUAL_TABLET | SUBLINGUAL | Status: DC | PRN

## 2016-01-02 MED ORDER — FUROSEMIDE 20 MG PO TABS: 20.0000 mg | ORAL_TABLET | Freq: Every day | ORAL | 1 refills | Status: DC

## 2016-01-02 MED ORDER — INSULIN ASPART 100 UNIT/ML ~~LOC~~ SOLN
0.0000 [IU] | Freq: Three times a day (TID) | SUBCUTANEOUS | Status: DC
  Administered 2016-01-02: 2 [IU] via SUBCUTANEOUS
  Filled 2016-01-02: qty 2

## 2016-01-02 MED ORDER — MOMETASONE FURO-FORMOTEROL FUM 200-5 MCG/ACT IN AERO
2.0000 | INHALATION_SPRAY | Freq: Two times a day (BID) | RESPIRATORY_TRACT | Status: DC
  Administered 2016-01-02: 2 via RESPIRATORY_TRACT
  Filled 2016-01-02: qty 8.8

## 2016-01-02 MED ORDER — LOSARTAN POTASSIUM 50 MG PO TABS
100.0000 mg | ORAL_TABLET | Freq: Every day | ORAL | Status: DC
  Administered 2016-01-02: 100 mg via ORAL
  Filled 2016-01-02: qty 2

## 2016-01-02 MED ORDER — ALENDRONATE SODIUM 70 MG PO TABS: 70.0000 mg | ORAL_TABLET | ORAL | Status: DC

## 2016-01-02 MED ORDER — IPRATROPIUM-ALBUTEROL 20-100 MCG/ACT IN AERS: 1.0000 | INHALATION_SPRAY | Freq: Three times a day (TID) | RESPIRATORY_TRACT | Status: DC | PRN

## 2016-01-02 MED ORDER — ALPRAZOLAM 0.5 MG PO TABS
0.5000 mg | ORAL_TABLET | Freq: Every day | ORAL | Status: DC
  Administered 2016-01-02: 0.5 mg via ORAL
  Filled 2016-01-02: qty 1

## 2016-01-02 MED ORDER — PREGABALIN 50 MG PO CAPS
100.0000 mg | ORAL_CAPSULE | Freq: Three times a day (TID) | ORAL | Status: DC
  Administered 2016-01-02: 100 mg via ORAL
  Filled 2016-01-02: qty 2

## 2016-01-02 NOTE — H&P (Signed)
Pamela Collier at [place] [name]    MR#:  [id number]  DATE OF BIRTH:  1941/03/02  DATE OF ADMISSION:  01/02/2016  PRIMARY CARE PHYSICIAN: [name], MD   REQUESTING/REFERRING PHYSICIAN:   CHIEF COMPLAINT:   Chief Complaint  Patient presents with  . Chest Pain    HISTORY OF PRESENT ILLNESS: Pamela Collier  is a 75 y.o. female with a known history of Ischemic cardiomyopathy with EF of [id number], chronic diastolic heart failure, type 2 diabetes mellitus, COPD, coronary artery disease, hepatic steatosis, hyperlipidemia, osteoporosis, peripheral vascular disease presented to the emergency room for chest pain. The chest pain started last night when patient was at home. Pain is located in the left side of the chest and she had this dilated in pain which was 8 out of 10 on a scale of 1-10. Patient took nitroglycerin tablets at home and her chest pain came down to 5 out of 10 on a scale of 1-10 .No history of any orthopnea or proximal nocturnal dyspnea. Patient had an episode of shortness of breath and she experienced chest pain. Patient also complains of back pain that is bothering her for the last couple of days. The back pain is located in the lower back, no history of any bowel or bladder incontinence. Patient also complains of numbness in both upper extremities and the hands and both feet of lower extremity. She has history of diabetes mellitus and says she has been compliant with medication. Patient follows up with [facility] cardiology as outpatient. EKG normal sinus rhythm with no ST segment changes and first set of troponin is 0.03. No history of any slurred speech. No complaints of headache, dizziness and blurry vision.  PAST MEDICAL HISTORY:   Past Medical History:  Diagnosis Date  . Acute respiratory failure ([place]) 11/21/2014  . Arthritis 08/05/2013  . Atherosclerotic peripheral vascular disease with gangrene [facility]) august 2012  .  Cardiomyopathy, ischemic    a. EF 35 to 40% by echo in 2013 b. EF improved to 50-55% by echo in 04/2015.  [name] Cerebral infarct ([place]) 08/17/2013  . Chronic diastolic CHF (congestive heart failure) (HCC)    a. EF 50-55% by echo in 04/2015  . COPD (chronic obstructive pulmonary disease) ([place])   . Coronary artery disease, occlusive    a. Previous PCI to the LAD, LCx, and RCA in 2010, 2011, 2013, and 2016. All performed in [place].  . 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 Diabetic diarrhea ([place]) 10/03/2014  . DM type 2, uncontrolled, with renal complications ([place]) [id number]  . Hepatic steatosis    by CT abd pelvis  . Hyperlipidemia LDL goal <100 02/23/2014  . Hypertensive heart disease   . Osteoporosis, post-menopausal   . 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 Pleural effusion 10/25/2012   10/2012 CT chest >> small to moderate R lung effusion>> chylothorax, 100% lymphs 10/2013 thoracentesis> cytology negative, WBC 1471, > 90% "small lymphs" 01/2014 CT chest> near complete resolution of pleural effusion, stable lymphadenopathy 08/2014 CT chest [place] ([facility]): small right sided effusion decreased in size, stable mediastinal lymphadenopathy 1.0cm largest, 52m  . 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 Renal insufficiency   . Sarcoidosis ([place])     PAST SURGICAL HISTORY: Past Surgical History:  Procedure Laterality Date  . ABDOMINAL HYSTERECTOMY     at ge 39. secondary to bleeding  . BREAST CYST ASPIRATION Right   . CARPAL TUNNEL RELEASE Bilateral   . CHOLECYSTECTOMY    . COLONOSCOPY WITH PROPOFOL N/A 01/09/2015   Procedure: COLONOSCOPY WITH PROPOFOL;  Surgeon: Lucilla Lame, MD;  Location: [facility] ENDOSCOPY;  Service: Endoscopy;  Laterality: N/A;  . CORONARY ANGIOPLASTY WITH STENT PLACEMENT     [place]; [facility]  . PTCA  August 2012   Right Posterior tibial artery , [place]  . TOE AMPUTATION  Sept 2012   Right 2nd toe, [place]    SOCIAL HISTORY:  Social History  Substance Use Topics  . Smoking status: Never Smoker  . Smokeless tobacco: Never Used  . Alcohol use No    FAMILY HISTORY:  Family History  Problem Relation Age of Onset  . Heart disease Father   . Heart attack Father   . Heart disease Sister   . Heart attack Sister   . Heart disease Brother   . Heart attack Brother   . Asthma Grandchild   . Breast cancer Maternal Aunt     DRUG ALLERGIES:  Allergies  Allergen Reactions  . Contrast Media [Iodinated Diagnostic Agents] Itching  . Gabapentin Itching  . Sulfa Antibiotics Itching    REVIEW OF SYSTEMS:   CONSTITUTIONAL: No fever, fatigue or weakness.  EYES: No blurred or double vision.  EARS, NOSE, AND THROAT: No tinnitus or ear pain.  RESPIRATORY: No cough, shortness of breath, wheezing or hemoptysis.  CARDIOVASCULAR: Has chest pain, no orthopnea, edema.  GASTROINTESTINAL: No nausea, vomiting, diarrhea or abdominal pain.  GENITOURINARY: No dysuria, hematuria.  ENDOCRINE: No polyuria, nocturia,  HEMATOLOGY: No anemia, easy bruising or bleeding SKIN: No rash or lesion. MUSCULOSKELETAL: No joint pain or arthritis.  Has low back pain. NEUROLOGIC: No tingling, numbness noted in both feet and hands, no weakness.  PSYCHIATRY: No anxiety or depression.   MEDICATIONS AT HOME:  Prior to Admission medications   Medication Sig Start Date End Date Taking? Authorizing Provider  acetaminophen (TYLENOL) 500 MG tablet Take 500 mg by mouth every 6 (six) hours as needed for mild pain or headache.    Historical Provider, MD  albuterol (PROAIR HFA) 108 (90 Base) MCG/ACT inhaler Inhale 2 puffs into the lungs every 6 (six)  hours as needed for wheezing or shortness of breath. 10/17/15   [name], MD  albuterol (PROVENTIL) (2.5 MG/3ML) 0.083% nebulizer solution Take 3 mLs (2.5 mg total) by nebulization every 6 (six) hours as needed for wheezing or shortness of breath. 12/06/14   [name], MD  alendronate (FOSAMAX) 70 MG tablet Take 70 mg by mouth once a week. Pt takes on Sunday.   Take with a full glass of water on an empty stomach.    Historical Provider, MD  ALPRAZolam [name]) 0.5 MG tablet Take 0.5 mg by mouth daily.    Historical Provider, MD  aspirin EC 81 MG tablet Take 81 mg by mouth daily.    Historical Provider, MD  B-D UF III MINI PEN NEEDLES 31G X 5 MM MISC  09/17/15   Historical Provider, MD  benzonatate (TESSALON) 100 MG capsule Take 200 mg by mouth 3 (three) times daily as needed for cough. Reported  on 11/09/2015    Historical Provider, MD  BRILINTA 90 MG TABS tablet Take 1 tablet (90 mg total) by mouth 2 (two) times daily. 10/16/15   [name], MD  budesonide-formoterol (SYMBICORT) 160-4.5 MCG/ACT inhaler Inhale 2 puffs into the lungs 2 (two) times daily. 02/23/15   Juanito Doom, MD  docusate sodium (COLACE) 100 MG capsule Take 100 mg by mouth 2 (two) times daily as needed for mild constipation. Reported on 11/09/2015    Historical Provider, MD  fluticasone (FLONASE) 50 MCG/ACT nasal spray Place 2 sprays into both nostrils daily as needed for rhinitis.    Historical Provider, MD  furosemide (LASIX) 20 MG tablet Take 1 tablet (20 mg total) by mouth daily. 10/15/15   [name], MD  Insulin Lispro Prot & Lispro (HUMALOG MIX 75/25 KWIKPEN) (75-25) 100 UNIT/ML Kwikpen Inject 45 to 60 units twice daily before morning and evening meals 11/27/15   [name], MD  Ipratropium-Albuterol (COMBIVENT RESPIMAT) 20-100 MCG/ACT AERS respimat Inhale 1 puff into the lungs 3 (three) times daily as needed for wheezing or shortness of breath. Reported on 10/25/2015    Historical Provider, MD  isosorbide  mononitrate (IMDUR) 30 MG 24 hr tablet Take 30 mg by mouth daily.    Historical Provider, MD  levofloxacin (LEVAQUIN) 500 MG tablet Take 1 tablet (500 mg total) by mouth daily. 12/21/15   [name], MD  losartan (COZAAR) 100 MG tablet Take 1 tablet (100 mg total) by mouth daily. 10/15/15   [name], MD  losartan (COZAAR) 100 MG tablet TAKE 1 TABLET DAILY 12/19/15   [name], MD  metoprolol tartrate (LOPRESSOR) 25 MG tablet Take 1 tablet (25 mg total) by mouth 2 (two) times daily. 10/25/15   [name], MD  nitroGLYCERIN (NITROSTAT) 0.4 MG SL tablet Place 1 tablet (0.4 mg total) under the tongue every 5 (five) minutes as needed for chest pain. 04/18/15   Minna Merritts, MD  ondansetron (ZOFRAN) 8 MG tablet Take 1 tablet (8 mg total) by mouth every 8 (eight) hours as needed for nausea or vomiting. 10/25/15   Colon Branch, MD  PARoxetine (PAXIL) 20 MG tablet Take 1 tablet (20 mg total) by mouth daily. 09/26/15   [name], MD  polyethylene glycol (MIRALAX / GLYCOLAX) packet Take 17 g by mouth daily as needed for mild constipation. Reported on 10/25/2015    Historical Provider, MD  predniSONE (DELTASONE) 10 MG tablet 6 tablets on Day 1 , then reduce by 1 tablet daily until gone 12/21/15   [name], MD  pregabalin (LYRICA) 100 MG capsule Take 1 capsule (100 mg total) by mouth 3 (three) times daily. 10/19/15   [name], MD  rosuvastatin (CRESTOR) 20 MG tablet Take 20 mg by mouth at bedtime.    Historical Provider, MD  traMADol (ULTRAM) 50 MG tablet Take 50 mg by mouth every 6 (six) hours as needed for moderate pain.    Historical Provider, MD  Vitamin D, Ergocalciferol, (DRISDOL) 50000 units CAPS capsule Take 50,000 Units by mouth every 7 (seven) days. Pt takes on Sunday.    Historical Provider, MD      PHYSICAL EXAMINATION:   VITAL SIGNS: Blood pressure (!) 169/55, pulse 64, temperature 98.7 F (37.1 C), temperature source Oral, resp. rate 19, height 5\' 7"  (1.702 m), weight  86.2 kg (190 lb), SpO2 100 %.  GENERAL:  75 y.o.-year-old patient lying in the bed with no  acute distress.  EYES: Pupils equal, round, reactive to light and accommodation. No scleral icterus. Extraocular muscles intact.  HEENT: Head atraumatic, normocephalic. Oropharynx and nasopharynx clear.  NECK:  Supple, no jugular venous distention. No thyroid enlargement, no tenderness.  LUNGS: Normal breath sounds bilaterally, no wheezing, rales,rhonchi or crepitation. No use of accessory muscles of respiration.  CARDIOVASCULAR: S1, S2 normal. No murmurs, rubs, or gallops.  ABDOMEN: obese, soft, nontender, nondistended. Bowel sounds present. No organomegaly or mass.  EXTREMITIES: No pedal edema, cyanosis, or clubbing.  NEUROLOGIC: Cranial nerves II through XII are intact. Muscle strength 5/5 in all extremities. Sensation intact. Gait not checked.  PSYCHIATRIC: The patient is alert and oriented x 3.  SKIN: No obvious rash, lesion, or ulcer.   LABORATORY PANEL:   CBC  Recent Labs Lab 01/02/16 0214  WBC 7.7  HGB 11.1*  HCT 32.0*  PLT 202  MCV 88.9  MCH 30.7  MCHC 34.6  RDW 15.5*   ------------------------------------------------------------------------------------------------------------------  Chemistries   Recent Labs Lab 01/02/16 0214  NA 141  K 3.7  CL 113*  CO2 22  GLUCOSE 156*  BUN 31*  CREATININE 1.60*  CALCIUM 9.0   ------------------------------------------------------------------------------------------------------------------ estimated creatinine clearance is 34.2 mL/min (by C-G formula based on SCr of 1.6 mg/dL). ------------------------------------------------------------------------------------------------------------------ No results for input(s): TSH, T4TOTAL, T3FREE, THYROIDAB in the last 72 hours.  Invalid input(s): FREET3   Coagulation profile No results for input(s): INR, PROTIME in the last 168  hours. ------------------------------------------------------------------------------------------------------------------- No results for input(s): DDIMER in the last 72 hours. -------------------------------------------------------------------------------------------------------------------  Cardiac Enzymes  Recent Labs Lab 01/02/16 0214  TROPONINI 0.03*   ------------------------------------------------------------------------------------------------------------------ Invalid input(s): POCBNP  ---------------------------------------------------------------------------------------------------------------  Urinalysis    Component Value Date/Time   COLORURINE YELLOW 10/25/2015 1004   APPEARANCEUR CLEAR 10/25/2015 1004   APPEARANCEUR Clear 03/28/2014 0153   LABSPEC 1.020 10/25/2015 1004   LABSPEC 1.011 03/28/2014 0153   PHURINE 5.5 10/25/2015 1004   GLUCOSEU NEGATIVE 10/25/2015 1004   HGBUR SMALL (A) 10/25/2015 1004   BILIRUBINUR NEGATIVE 10/25/2015 1004   BILIRUBINUR Negative 03/28/2014 0153   KETONESUR NEGATIVE 10/25/2015 1004   PROTEINUR 30 (A) 10/23/2015 1708   UROBILINOGEN 0.2 10/25/2015 1004   NITRITE NEGATIVE 10/25/2015 1004   LEUKOCYTESUR NEGATIVE 10/25/2015 1004   LEUKOCYTESUR Negative 03/28/2014 0153     RADIOLOGY: Dg Chest 2 View  Result Date: 01/02/2016 CLINICAL DATA:  75 y/o F; chest pain and shortness of breath as well as back pain. EXAM: CHEST  2 VIEW COMPARISON:  Chest radiograph 10/23/2015 FINDINGS: Stable cardiac silhouette given differences in technique. Linear opacities at lung bases probably represent minor atelectasis. No consolidation, pneumothorax, or pleural effusion. Chronic left posterior lateral upper rib fractures. No acute osseous abnormality is identified. IMPRESSION: Bibasilar minor atelectasis. No acute cardiopulmonary process is identified. Electronically Signed   By: Kristine Garbe M.D.   On: 01/02/2016 02:48    EKG: Orders  placed or performed during the hospital encounter of 01/02/16  . ED EKG within 10 minutes  . ED EKG within 10 minutes    IMPRESSION AND PLAN: 75 year old elderly female patient with history of ischemic cardiomyopathy, hypertension, hyperlipidemia, type 2 diabetes mellitus, peripheral vascular disease, COPD presented to emergency room with chest pain which was relieved with nitroglycerin. Admitting diagnosis 1. Chest pain 2. Ischemic cardiopathy 3. Diastolic heart failure 4. Hypertension 5. Type 2 diabetes mellitus 6. Hyperlipidemia 7. Peripheral vascular disease Treatment plan Admit patient to telemetry observation bed Aspirin 81 mg orally daily DVT prophylaxis subcutaneous Lovenox 40 MG  daily Cycle troponin to rule out ischemia Check echocardiogram Cardiology consultation Numbness in the feet and hands could be secondary to diabetic neuropathy Control blood sugars Resume cardiac medications Supportive care.  All the records are reviewed and case discussed with ED provider. Management plans discussed with the patient, family and they are in agreement.  CODE STATUS:FULL Code Status History    Date Active Date Inactive Code Status Order ID Comments User Context   06/26/2015  7:36 AM 06/29/2015  7:54 PM Full Code [id number]  Demetrios Loll, MD ED   04/20/2015  1:18 AM 04/22/2015  7:18 PM Full Code [id number]  Lance Coon, MD Inpatient       TOTAL TIME TAKING CARE OF THIS PATIENT: 52 minutes.    Saundra Shelling M.D on 01/02/2016 at 4:12 AM  Between 7am to 6pm - Pager - [phone]  After 6pm go to [email or web address] - password [email or web address] [facility]  Office  [phone]  CC: Primary care physician; [name], MD

## 2016-01-02 NOTE — ED Triage Notes (Addendum)
Pt arrived via EMS from home c/o chest pain with associated SOB and back pain. Pt reports the chest pain is a dull ache in the left and right side. Pt took 1 nitro at home orio to EMS arriving, then EMS gave pt 1 nitro spray and 324 of ASA. Pt initially rated her pain 8/10 for EMS and it is down to a 5/10. Pt also reports that she is having numbness in her bilateral feet and arms. Pt reports that she feels like something is crawling up her right arm Pt moving all extremities without difficulty. Reports that the numbness started when the chest pain started, has had chest pain in the past, but has never had numbness like this before.

## 2016-01-02 NOTE — ED Provider Notes (Signed)
[facility] Emergency Department Provider Note   ____________________________________________   First MD Initiated Contact with Patient 01/02/16 [phone]     (approximate)  I have reviewed the triage vital signs and the nursing notes.   HISTORY  Chief Complaint Chest Pain    HPI Pamela Collier is a 75 y.o. female who presents to the ED from home via EMS with a chief complaint of chest pain. Patient has a history of ischemic cardiomyopathy, EF 35%, CAD status post stents, diabetes who complains of left-sided chest pain onset approximately 9 PM. Pain is radiating to her back, waxing/waning and she is having associated shortness of breath. Patient also reports numbness to her bilateral feet and arms. States she has had this sensations previously. Patient took one nitroglycerin at home prior to arrival. She received aspirin and another nitroglycerin per EMS. At home her chest pain was 8/10. Currently she is 5/10. Denies recent fever, chills, cough, congestion, abdominal pain, vomiting, diarrhea. Denies recent travel or trauma.   Past Medical History:  Diagnosis Date  . Acute respiratory failure ([place]) 11/21/2014  . Arthritis 08/05/2013  . Atherosclerotic peripheral vascular disease with gangrene [facility]) august 2012  . Cardiomyopathy, ischemic    a. EF 35 to 40% by echo in 2013 b. EF improved to 50-55% by echo in 04/2015.  [name] Cerebral infarct ([place]) 08/17/2013  . Chronic diastolic CHF (congestive heart failure) (HCC)    a. EF 50-55% by echo in 04/2015  . COPD (chronic obstructive pulmonary disease) ([place])   . Coronary artery disease, occlusive    a. Previous PCI to the LAD, LCx, and RCA in 2010, 2011, 2013, and 2016. All performed in [place].  . 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 Diabetic diarrhea ([place]) 10/03/2014  . DM type 2, uncontrolled, with renal complications ([place]) [id number]  . Hepatic steatosis    by CT abd pelvis  . Hyperlipidemia LDL goal <100 02/23/2014  .  Hypertensive heart disease   . Osteoporosis, post-menopausal   . 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 Pleural effusion 10/25/2012   10/2012 CT chest >> small to moderate R lung effusion>> chylothorax, 100% lymphs 10/2013 thoracentesis> cytology negative, WBC 1471, > 90% "small lymphs" 01/2014 CT chest> near complete resolution of pleural effusion, stable lymphadenopathy 08/2014 CT chest [place] ([facility]): small right sided effusion decreased in size, stable mediastinal lymphadenopathy 1.0cm largest, 50m  . 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 Renal insufficiency   . Sarcoidosis [facility]     Patient Active Problem List   Diagnosis Date Noted  . Otitis media 12/23/2015  . Dehydration 10/28/2015  . Type 2 diabetes mellitus, uncontrolled, with neuropathy ([place]) 09/27/2015  . Hypertensive heart disease   . Coronary artery disease, occlusive   . OSA on CPAP 07/07/2015  . Hospital discharge follow-up 07/07/2015  . Chest pain with low risk of acute coronary syndrome 06/29/2015  . Community acquired pneumonia   . Pleural effusion, right   . Coronary artery disease involving native coronary artery of native heart with angina pectoris ([place])   . Diarrhea 05/15/2015  . Fall 04/19/2015  . Acute  on chronic kidney failure ([place]) 04/19/2015  . Chronic diastolic CHF (congestive heart failure) ([place]) 04/18/2015  . Diabetic neuropathy ([place]) 02/21/2015  . Benign neoplasm of sigmoid colon   . Diabetic diarrhea ([place]) 10/03/2014  . Low back pain with radiation 04/04/2014  . Essential hypertension 04/04/2014  . Hyperlipidemia LDL goal <100 02/23/2014  . Long-term use of  high-risk medication 02/23/2014  . Dermatophytic onychia 08/17/2013  . Angina pectoris ([place]) 08/17/2013  . Arteriosclerosis of coronary artery 08/17/2013  . Cerebral infarct ([place]) 08/17/2013  . Bony exostosis 08/17/2013  . Arthritis 08/05/2013  . History of colonic polyps 02/20/2013  . 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 Dysphagia 12/07/2012  . Cough 10/25/2012  . Pulmonary nodule seen on imaging study 05/20/2012  . 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 Cardiomyopathy, ischemic   . Hepatic steatosis   . Osteoporosis, post-menopausal   . Peripheral vascular disease due to secondary diabetes mellitus ([place])   . DM (diabetes mellitus), type 2, uncontrolled, with renal complications ([place]) [id number]  . Double vessel coronary artery disease 12/14/2011    Past Surgical History:  Procedure Laterality Date  . ABDOMINAL HYSTERECTOMY     at ge 39. secondary to bleeding  . BREAST CYST ASPIRATION Right   . CARPAL TUNNEL RELEASE Bilateral   . CHOLECYSTECTOMY    . COLONOSCOPY WITH PROPOFOL N/A 01/09/2015   Procedure: COLONOSCOPY WITH PROPOFOL;  Surgeon: Lucilla Lame, MD;  Location: [facility] ENDOSCOPY;  Service: Endoscopy;  Laterality: N/A;  . CORONARY ANGIOPLASTY WITH STENT PLACEMENT     [place]; [facility]  . PTCA  August 2012   Right Posterior tibial artery , [place]  . TOE AMPUTATION  Sept 2012   Right 2nd toe, [place]    Prior to Admission medications   Medication Sig Start Date End Date Taking? Authorizing Provider  acetaminophen (TYLENOL) 500 MG tablet Take 500 mg by mouth every 6 (six) hours as needed for mild pain or headache.    Historical Provider, MD  albuterol (PROAIR HFA) 108 (90 Base) MCG/ACT inhaler Inhale 2 puffs into the lungs every 6 (six) hours as needed for wheezing or shortness of breath. 10/17/15   [name], MD  albuterol (PROVENTIL) (2.5 MG/3ML) 0.083% nebulizer solution Take 3  mLs (2.5 mg total) by nebulization every 6 (six) hours as needed for wheezing or shortness of breath. 12/06/14   [name], MD  alendronate (FOSAMAX) 70 MG tablet Take 70 mg by mouth once a week. Pt takes on Sunday.   Take with a full glass of water on an empty stomach.    Historical Provider, MD  ALPRAZolam [name]) 0.5 MG tablet Take 0.5 mg by mouth daily.    Historical Provider, MD  aspirin EC 81 MG tablet Take 81 mg by mouth daily.    Historical Provider, MD  B-D UF III MINI PEN NEEDLES 31G X 5 MM MISC  09/17/15   Historical Provider, MD  benzonatate (TESSALON) 100 MG capsule Take 200 mg by mouth 3 (three) times daily as needed for cough. Reported on 11/09/2015    Historical Provider, MD  BRILINTA 90 MG TABS tablet Take 1 tablet (90 mg total) by mouth 2 (two) times daily. 10/16/15   [name], MD  budesonide-formoterol (SYMBICORT) 160-4.5 MCG/ACT inhaler Inhale 2 puffs into the lungs 2 (two) times daily. 02/23/15   Juanito Doom, MD  docusate sodium (COLACE) 100 MG capsule Take 100 mg  by mouth 2 (two) times daily as needed for mild constipation. Reported on 11/09/2015    Historical Provider, MD  fluticasone (FLONASE) 50 MCG/ACT nasal spray Place 2 sprays into both nostrils daily as needed for rhinitis.    Historical Provider, MD  furosemide (LASIX) 20 MG tablet Take 1 tablet (20 mg total) by mouth daily. 10/15/15   [name], MD  Insulin Lispro Prot & Lispro (HUMALOG MIX 75/25 KWIKPEN) (75-25) 100 UNIT/ML Kwikpen Inject 45 to 60 units twice daily before morning and evening meals 11/27/15   [name], MD  Ipratropium-Albuterol (COMBIVENT RESPIMAT) 20-100 MCG/ACT AERS respimat Inhale 1 puff into the lungs 3 (three) times daily as needed for wheezing or shortness of breath. Reported on 10/25/2015    Historical Provider, MD  isosorbide mononitrate (IMDUR) 30 MG 24 hr tablet Take 30 mg by mouth daily.    Historical Provider, MD  levofloxacin (LEVAQUIN) 500 MG tablet Take 1 tablet (500 mg  total) by mouth daily. 12/21/15   [name], MD  losartan (COZAAR) 100 MG tablet Take 1 tablet (100 mg total) by mouth daily. 10/15/15   [name], MD  losartan (COZAAR) 100 MG tablet TAKE 1 TABLET DAILY 12/19/15   [name], MD  metoprolol tartrate (LOPRESSOR) 25 MG tablet Take 1 tablet (25 mg total) by mouth 2 (two) times daily. 10/25/15   [name], MD  nitroGLYCERIN (NITROSTAT) 0.4 MG SL tablet Place 1 tablet (0.4 mg total) under the tongue every 5 (five) minutes as needed for chest pain. 04/18/15   Minna Merritts, MD  ondansetron (ZOFRAN) 8 MG tablet Take 1 tablet (8 mg total) by mouth every 8 (eight) hours as needed for nausea or vomiting. 10/25/15   Colon Branch, MD  PARoxetine (PAXIL) 20 MG tablet Take 1 tablet (20 mg total) by mouth daily. 09/26/15   [name], MD  polyethylene glycol (MIRALAX / GLYCOLAX) packet Take 17 g by mouth daily as needed for mild constipation. Reported on 10/25/2015    Historical Provider, MD  predniSONE (DELTASONE) 10 MG tablet 6 tablets on Day 1 , then reduce by 1 tablet daily until gone 12/21/15   [name], MD  pregabalin (LYRICA) 100 MG capsule Take 1 capsule (100 mg total) by mouth 3 (three) times daily. 10/19/15   [name], MD  rosuvastatin (CRESTOR) 20 MG tablet Take 20 mg by mouth at bedtime.    Historical Provider, MD  traMADol (ULTRAM) 50 MG tablet Take 50 mg by mouth every 6 (six) hours as needed for moderate pain.    Historical Provider, MD  Vitamin D, Ergocalciferol, (DRISDOL) 50000 units CAPS capsule Take 50,000 Units by mouth every 7 (seven) days. Pt takes on Sunday.    Historical Provider, MD    Allergies Contrast media [iodinated diagnostic agents]; Gabapentin; and Sulfa antibiotics  Family History  Problem Relation Age of Onset  . Heart disease Father   . Heart attack Father   . Heart disease Sister   . Heart attack Sister   . Heart disease Brother   . Heart attack Brother   . Asthma Grandchild   . Breast cancer  Maternal Aunt     Social History Social History  Substance Use Topics  . Smoking status: Never Smoker  . Smokeless tobacco: Never Used  . Alcohol use No    Review of Systems  Constitutional: No fever/chills. Eyes: No visual changes. ENT: No sore throat. Cardiovascular: Positive for  chest pain. Respiratory: Positive for shortness of breath. Gastrointestinal: No abdominal pain.  No nausea, no vomiting.  No diarrhea.  No constipation. Genitourinary: Negative for dysuria. Musculoskeletal: Negative for back pain. Skin: Negative for rash. Neurological: Negative for headaches, focal weakness. Positive for extremity numbness.  10-point ROS otherwise negative.  ____________________________________________   PHYSICAL EXAM:  VITAL SIGNS: ED Triage Vitals  Enc Vitals Group     BP 01/02/16 0207 (!) 169/55     Pulse Rate 01/02/16 0207 64     Resp 01/02/16 0207 19     Temp 01/02/16 0207 98.7 F (37.1 C)     Temp Source 01/02/16 0207 Oral     SpO2 01/02/16 0207 100 %     Weight 01/02/16 0208 190 lb (86.2 kg)     Height 01/02/16 0208 5\' 7"  (1.702 m)     Head Circumference --      Peak Flow --      Pain Score 01/02/16 0208 5     Pain Loc --      Pain Edu? --      Excl. in [place]? --     Constitutional: Alert and oriented. Well appearing and in mild acute distress. Eyes: Conjunctivae are normal. PERRL. EOMI. Head: Atraumatic. Nose: No congestion/rhinnorhea. Mouth/Throat: Mucous membranes are moist.  Oropharynx non-erythematous. Neck: No stridor.   Cardiovascular: Normal rate, regular rhythm. Grossly normal heart sounds.  Good peripheral circulation. Respiratory: Normal respiratory effort.  No retractions. Lungs CTAB. Gastrointestinal: Soft and nontender. No distention. No abdominal bruits. No CVA tenderness. Musculoskeletal: No lower extremity tenderness nor edema.  No joint effusions. Neurologic:  Normal speech and language. No gross focal neurologic deficits are appreciated.    Skin:  Skin is warm, dry and intact. No rash noted. Psychiatric: Mood and affect are normal. Speech and behavior are normal.  ____________________________________________   LABS (all labs ordered are listed, but only abnormal results are displayed)  Labs Reviewed  BASIC METABOLIC PANEL - Abnormal; Notable for the following:       Result Value   Chloride 113 (*)    Glucose, Bld 156 (*)    BUN 31 (*)    Creatinine, Ser 1.60 (*)    GFR calc non Af Amer 30 (*)    GFR calc Af Amer 35 (*)    All other components within normal limits  CBC - Abnormal; Notable for the following:    RBC 3.60 (*)    Hemoglobin 11.1 (*)    HCT 32.0 (*)    RDW 15.5 (*)    All other components within normal limits  TROPONIN I - Abnormal; Notable for the following:    Troponin I 0.03 (*)    All other components within normal limits   ____________________________________________  EKG  ED ECG REPORT I, SUNG,JADE J, the attending physician, personally viewed and interpreted this ECG.   Date: 01/02/2016  EKG Time: 0206  Rate: 64  Rhythm: normal EKG, normal sinus rhythm  Axis: LAD  Intervals:right bundle branch block  ST&T Change: Nonspecific  ____________________________________________  RADIOLOGY  Chest 2 view (viewed by me, interpreted per Dr. Toney Reil): Bibasilar minor atelectasis. No acute cardiopulmonary process is  identified.    ____________________________________________   PROCEDURES  Procedure(s) performed: None  Procedures  Critical Care performed:   CRITICAL CARE Performed by: Paulette Blanch   Total critical care time: 30 minutes  Critical care time was exclusive of separately billable procedures and treating other patients.  Critical care was necessary to  treat or prevent imminent or life-threatening deterioration.  Critical care was time spent personally by me on the following activities: development of treatment plan with patient and/or surrogate as well as nursing,  discussions with consultants, evaluation of patient's response to treatment, examination of patient, obtaining history from patient or surrogate, ordering and performing treatments and interventions, ordering and review of laboratory studies, ordering and review of radiographic studies, pulse oximetry and re-evaluation of patient's condition.  ____________________________________________   INITIAL IMPRESSION / ASSESSMENT AND PLAN / ED COURSE  Pertinent labs & imaging results that were available during my care of the patient were reviewed by me and considered in my medical decision making (see chart for details).  75 year old female with ischemic cardiomyopathy, CAD status post stents who presents with left-sided chest pressure partially relieved with 2 nitroglycerin prior to arrival. Will administer morphine, check screening lab work including troponin. Anticipate hospital admission for unstable angina.  Clinical Course  Comment By Time  Updated patient of laboratory imaging results. Will discuss with hospitalist to evaluate patient in the emergency department for admission. Paulette Blanch, MD 08/23 [phone]     ____________________________________________   FINAL CLINICAL IMPRESSION(S) / ED DIAGNOSES  Final diagnoses:  Chest pain, unspecified chest pain type  Unstable angina (HCC)  Cardiomyopathy ([place])  Coronary artery disease involving native coronary artery of native heart with unstable angina pectoris ([place])      NEW MEDICATIONS STARTED DURING THIS VISIT:  New Prescriptions   No medications on file     Note:  This document was prepared using Dragon voice recognition software and may include unintentional dictation errors.    Paulette Blanch, MD 01/02/16 [phone]

## 2016-01-02 NOTE — Telephone Encounter (Signed)
Patient will need a appt-d time and date on Dr. Derrel Nip schedule for a HFU. She will discharge today from [facility].

## 2016-01-02 NOTE — Telephone Encounter (Signed)
Noted. Will follow.  

## 2016-01-02 NOTE — Telephone Encounter (Signed)
Blood sugar log in yellow results folder.

## 2016-01-02 NOTE — Care Management Obs Status (Signed)
MEDICARE OBSERVATION STATUS NOTIFICATION   Patient Details  [name] MRN: [id number] Date of Birth: 05-Oct-1940   Medicare Observation Status Notification Given:  Yes    Katrina Stack, RN 01/02/2016, 10:00 AM

## 2016-01-02 NOTE — Progress Notes (Signed)
*  PRELIMINARY RESULTS* Echocardiogram 2D Echocardiogram has been performed.  Pamela Collier 01/02/2016, 10:02 AM

## 2016-01-02 NOTE — Progress Notes (Signed)
Patient discharged home to self care as per MD order .

## 2016-01-02 NOTE — Consult Note (Signed)
CARDIOLOGY CONSULT NOTE  Patient ID: NORMALEE FICKES MRN: [id number] DOB/AGE: 76-10-1940 76 y.o.  Admit date: 01/02/2016  Primary Cardiologist : Dr. Rockey Situ Reason for Consultation : Chest pain, angina Physician requesting consult; Dr. 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 Denies any heavy lifting or trauma. Has not tried any medications for her symptoms. -Also reports having numbness in her feet bilaterally. Makes her balance more unsteady. Reports she has been working with a Hospital doctor, works with a cane, rarely walks without a cane but slowly doing better -Left side chest pain for the past day or so, reproducible on palpation. Tender above the left breasts, does not radiate. Not associated with exertion. Does not get worse with movement. Movement out of bed Limited by her right low back pain  In general is sedentary, lives with her husband, no regular exercise program She does report increasing shortness of breath recently. She provides an example, increasing shortness of breath walking to the bathroom States her Lasix was held last month. Discuss case with nephrology who had recommended she restart her Lasix but patient claims she has not done so yet.  Lab work shows relatively stable renal function, creatinine 1.4 up to 1.6  Other past medical history reviewed Previous echocardiogram which showed low-normal LV systolic function with an ejection fraction of 50-55% and no significant pulmonary hypertension.      Past Medical History:  Diagnosis Date  . Acute  respiratory failure ([place]) 11/21/2014  . Arthritis 08/05/2013  . Atherosclerotic peripheral vascular disease with gangrene [facility]) august 2012  . Cardiomyopathy, ischemic    a. EF 35 to 40% by echo in 2013 b. EF improved to 50-55% by echo in 04/2015.  [name] Cerebral infarct ([place]) 08/17/2013  . Chronic diastolic CHF (congestive heart failure) (HCC)    a. EF 50-55% by echo in 04/2015  . COPD (chronic obstructive pulmonary disease) ([place])   . Coronary artery disease, occlusive    a. Previous PCI to the LAD, LCx, and RCA in 2010, 2011, 2013, and 2016. All performed in [place].  . 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 Diabetic diarrhea ([place]) 10/03/2014  . DM type 2, uncontrolled, with renal complications ([place]) [id number]  . Hepatic steatosis    by CT abd pelvis  . Hyperlipidemia LDL goal <100 02/23/2014  . Hypertensive heart disease   . Osteoporosis, post-menopausal   . 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 Pleural effusion 10/25/2012   10/2012 CT chest >> small to moderate R lung effusion>> chylothorax, 100% lymphs 10/2013 thoracentesis> cytology negative, WBC 1471, > 90% "small lymphs" 01/2014 CT chest> near complete resolution of pleural effusion, stable lymphadenopathy 08/2014 CT chest [place] ([facility]): small right sided effusion decreased in size, stable mediastinal lymphadenopathy 1.0cm largest, 38m  . Pulmonary sarcoidosis ([place]) 12/07/2012   Diagnosed over 20 years ago in [place] with a mediastinal biopsy 03/2013 Full PFT [facility] > UNACCEPTABLE AND NOT REPRODUCIBLE DATA> Ratio 71% FEV 1 1.02 L (55% pred), FVC 1.31 L (49% pred)  could not do lung volumes or DLCO   . Renal insufficiency   . Sarcoidosis ([place])     Family History  Problem Relation Age of Onset  . Heart disease Father   . Heart attack Father   . Heart disease Sister   . Heart attack Sister   . Heart disease Brother   . Heart  attack Brother   . Asthma Grandchild   . Breast cancer Maternal Aunt     Social History   Social History  . Marital status: Married    Spouse name: N/A  . Number of children: N/A  . Years of education: N/A   Occupational History  . retired    Social History Main Topics  . Smoking status: Never Smoker  . Smokeless tobacco: Never Used  . Alcohol use No  . Drug use: No  . Sexual activity: Yes   Other Topics Concern  . Not on file   Social History Narrative  . No narrative on file    Past Surgical History:  Procedure Laterality Date  . ABDOMINAL HYSTERECTOMY     at ge 39. secondary to bleeding  . BREAST CYST ASPIRATION Right   . CARPAL TUNNEL RELEASE Bilateral   . CHOLECYSTECTOMY    . COLONOSCOPY WITH PROPOFOL N/A 01/09/2015   Procedure: COLONOSCOPY WITH PROPOFOL;  Surgeon: Lucilla Lame, MD;  Location: [facility] ENDOSCOPY;  Service: Endoscopy;  Laterality: N/A;  . CORONARY ANGIOPLASTY WITH STENT PLACEMENT     [place]; [facility]  . PTCA  August 2012   Right Posterior tibial artery , [place]  . TOE AMPUTATION  Sept 2012   Right 2nd toe, [place]     Prescriptions Prior to Admission  Medication Sig Dispense Refill Last Dose  . acetaminophen (TYLENOL) 500 MG tablet Take 500 mg by mouth every 6 (six) hours as needed for mild pain or headache.   Taking  . albuterol (PROAIR HFA) 108 (90 Base) MCG/ACT inhaler Inhale 2 puffs into the lungs every 6 (six) hours as needed for wheezing or shortness of breath. 1 Inhaler 2 Taking  . albuterol (PROVENTIL) (2.5 MG/3ML) 0.083% nebulizer solution Take 3 mLs (2.5 mg total) by nebulization every 6 (six) hours as needed for wheezing or shortness of breath. 150 mL 1 Taking  . alendronate (FOSAMAX) 70 MG tablet Take 70 mg by mouth once a week. Pt takes on Sunday.   Take with a full glass of water on an empty stomach.   Taking  . ALPRAZolam (XANAX) 0.5 MG tablet Take 0.5 mg by mouth daily.   Taking  . aspirin EC 81 MG tablet  Take 81 mg by mouth daily.   Taking  . B-D UF III MINI PEN NEEDLES 31G X 5 MM MISC    Taking  . benzonatate (TESSALON) 100 MG capsule Take 200 mg by mouth 3 (three) times daily as needed for cough. Reported on 11/09/2015   Taking  . BRILINTA 90 MG TABS tablet Take 1 tablet (90 mg total) by mouth 2 (two) times daily. 180 tablet 2 Taking  . budesonide-formoterol (SYMBICORT) 160-4.5 MCG/ACT inhaler Inhale 2 puffs into the lungs 2 (two) times daily. 1 Inhaler 0 Taking  . docusate sodium (COLACE) 100 MG capsule Take 100 mg by mouth 2 (two) times daily as needed for mild constipation. Reported on 11/09/2015   Taking  . fluticasone (FLONASE) 50 MCG/ACT nasal spray Place 2 sprays into both nostrils daily as needed for rhinitis.   Taking  .  Insulin Lispro Prot & Lispro (HUMALOG MIX 75/25 KWIKPEN) (75-25) 100 UNIT/ML Kwikpen Inject 45 to 60 units twice daily before morning and evening meals 112 mL 3 Taking  . Ipratropium-Albuterol (COMBIVENT RESPIMAT) 20-100 MCG/ACT AERS respimat Inhale 1 puff into the lungs 3 (three) times daily as needed for wheezing or shortness of breath. Reported on 10/25/2015   Taking  . isosorbide mononitrate (IMDUR) 30 MG 24 hr tablet Take 30 mg by mouth daily.   Taking  . levofloxacin (LEVAQUIN) 500 MG tablet Take 1 tablet (500 mg total) by mouth daily. 7 tablet 0   . losartan (COZAAR) 100 MG tablet Take 1 tablet (100 mg total) by mouth daily. 90 tablet 0 Taking  . losartan (COZAAR) 100 MG tablet TAKE 1 TABLET DAILY 90 tablet 1 Taking  . nitroGLYCERIN (NITROSTAT) 0.4 MG SL tablet Place 1 tablet (0.4 mg total) under the tongue every 5 (five) minutes as needed for chest pain. 25 tablet 3 Taking  . ondansetron (ZOFRAN) 8 MG tablet Take 1 tablet (8 mg total) by mouth every 8 (eight) hours as needed for nausea or vomiting. 15 tablet 0 Taking  . PARoxetine (PAXIL) 20 MG tablet Take 1 tablet (20 mg total) by mouth daily. 30 tablet 1 Taking  . polyethylene glycol (MIRALAX / GLYCOLAX) packet Take  17 g by mouth daily as needed for mild constipation. Reported on 10/25/2015   Taking  . predniSONE (DELTASONE) 10 MG tablet 6 tablets on Day 1 , then reduce by 1 tablet daily until gone (Patient not taking: Reported on 01/02/2016) 21 tablet 0 Not Taking at Unknown time  . pregabalin (LYRICA) 100 MG capsule Take 1 capsule (100 mg total) by mouth 3 (three) times daily. 270 capsule 2 Taking  . rosuvastatin (CRESTOR) 20 MG tablet Take 20 mg by mouth at bedtime.   Taking  . traMADol (ULTRAM) 50 MG tablet Take 50 mg by mouth every 6 (six) hours as needed for moderate pain.   Taking  . Vitamin D, Ergocalciferol, (DRISDOL) 50000 units CAPS capsule Take 50,000 Units by mouth every 7 (seven) days. Pt takes on Sunday.   Taking  . [DISCONTINUED] furosemide (LASIX) 20 MG tablet Take 1 tablet (20 mg total) by mouth daily. 90 tablet 1 Taking  . [DISCONTINUED] metoprolol tartrate (LOPRESSOR) 25 MG tablet Take 1 tablet (25 mg total) by mouth 2 (two) times daily. 180 tablet 1 Taking   Allergies:  Allergies  Allergen Reactions  . Contrast Media [Iodinated Diagnostic Agents] Itching  . Gabapentin Itching  . Sulfa Antibiotics Itching    Review of Systems: Review of Systems  Constitutional: Negative.   Respiratory: Negative.   Cardiovascular: Positive for chest pain.  Gastrointestinal: Negative.   Musculoskeletal: Positive for back pain.       Right arm tingling, numb Right lower back pain  Neurological: Negative.        Numbness in her feet bilaterally  Psychiatric/Behavioral: Negative.   All other systems reviewed and are negative.   Physical Exam  Vitals:   01/02/16 0528 01/02/16 1028  BP: (!) 148/45 (!) 148/49  Pulse: 61 (!) 58  Resp: 20 18  Temp: 98 F (36.7 C) 98.3 F (36.8 C)    GENERAL:  75 y.o.-year-old patient lying in the bed with no acute distress , Obese HEENT: PERRLA,   Head atraumatic, normocephalic. Oropharynx and nasopharynx clear.  NECK:  Supple, no jugular venous distention.  No thyroid enlargement, no tenderness.  LUNGS: Normal breath sounds bilaterally, no wheezing, rales,rhonchi  or crepitation. No use of accessory muscles of respiration.  CARDIOVASCULAR: S1, S2 normal. No murmurs, rubs, or gallops.  ABDOMEN: obese, soft, nontender, nondistended. Bowel sounds present. No organomegaly or mass.  EXTREMITIES: No pedal edema, cyanosis, or clubbing.  Musculoskeletal: Reproducible left-sided chest pain on palpation above the left breast NEUROLOGIC: Cranial nerves II through XII are intact. Muscle strength 5/5 in all extremities. Sensation intact. Gait not checked.  PSYCHIATRIC: The patient is alert and oriented x 3.  SKIN: No obvious rash, lesion, or ulcer.   Labs:   Lab Results  Component Value Date   WBC 7.6 01/02/2016   HGB 10.6 (L) 01/02/2016   HCT 31.0 (L) 01/02/2016   MCV 89.3 01/02/2016   PLT 198 01/02/2016     Recent Labs Lab 01/02/16 0531  NA 139  K 4.0  CL 112*  CO2 22  BUN 29*  CREATININE 1.44*  CALCIUM 8.8*  GLUCOSE 145*   Lab Results  Component Value Date   TROPONINI 0.03 (HH) 01/02/2016       EKG:  EKG this admission showing normal sinus rhythm with intraventricular conduction delay, left anterior fascicular block, no change from prior EKG  ASSESSMENT AND PLAN:    1) chest pain Atypical in nature, likely musculoskeletal Reproducible symptoms with palpation of the left chest, tender on pushing EKG unchanged, cardiac enzymes negative Echocardiogram with normal LV function On echo, Unable to exclude wall motion abnormality: Secondary to conduction issue versus previous MI. No further testing needed at this time -We'll have her follow-up in clinic, if she has any symptoms concerning for angina, could order stress testing -Would continue aspirin, brilinta (could decrease the dose down to 60 minute grams twice a day), statin  2.  chronic renal failure:  Discussed case with nephrology They had previously recommended she restart  her Lasix Would continue Lasix 20 mg daily Creatinine within her normal baseline 1.4 up to 1.6 Now with symptoms of shortness of breath, possible component of diastolic CHF  3. Chronic diastolic heart failure   ejection fraction of 50-55%. Grossly normal right heart pressures on echo  shortness of breath symptoms, etiology unclear, as she does have underlying lung disease Would continue Lasix 20 mg daily, losartan  4. Coronary artery disease:  If she has symptoms of angina, could consider outpatient stress testing  5. Pulmonary sarcoid Followed by pulmonary Likely contributing to shortness of breath symptoms  6. Shortness of breath Likely multifactorial including obesity, deconditioning, pulmonary sarcoid, Possible component of diastolic CHF Would restart Lasix 20 mg daily, follow-up with nephrology for BMP  7. Hyperlipidemia Would continue Crestor   8. Anemia Outpatient workup, will need iron studies, guaiac, if normal etiology could be secondary to underlying renal disease  Discussed case with nephrology and hospitalist service.  Total encounter time more than 110 minutes  Greater than 50% was spent in counseling and coordination of care with the patient    Signed: Ida Rogue MD, [facility] 01/02/2016, 12:02 PM

## 2016-01-02 NOTE — Care Management (Signed)
Placed in observation for chest pain.  Troponins are negative and cardiology consult is pending.  Independent in all adls, denies issues accessing medical care, obtaining medications or with transportation.  Current with her PCP. Has a walker and cane at home.  she has received home health in the past and if it is needed at discharge, agency preference would be [facility]

## 2016-01-02 NOTE — Discharge Instructions (Signed)
Resume diet and activity as before  Check weight and keep log to take to your doctor's office.

## 2016-01-02 NOTE — ED Notes (Signed)
Report given to floor RN. Pt taken to floor via stretcher. Vital signs stable prior to transport.  

## 2016-01-02 NOTE — Progress Notes (Signed)
[facility]  ROUNDING NOTE   Subjective:   Pamela Collier was admitted on 01/02/2016 for Unstable angina ([place]) [I20.0] Cardiomyopathy ([place]) [I42.9] Chest pain, unspecified chest pain type [R07.9] Coronary artery disease involving native coronary artery of native heart with unstable angina pectoris ([place]) [I25.110]  She seems to have been taken off her furosemide due to concerns about her renal insufficiency.   Objective:  Vital signs in last 24 hours:  Temp:  [98 F (36.7 C)-98.7 F (37.1 C)] 98.5 F (36.9 C) (08/23 1215) Pulse Rate:  [55-64] 58 (08/23 1215) Resp:  [16-23] 20 (08/23 1215) BP: (145-169)/(45-95) 149/58 (08/23 1215) SpO2:  [99 %-100 %] 100 % (08/23 1215) Weight:  [86.2 kg (190 lb)-87.3 kg (192 lb 6.4 oz)] 87.3 kg (192 lb 6.4 oz) (08/23 0525)  Weight change:  Filed Weights   01/02/16 0208 01/02/16 0525  Weight: 86.2 kg (190 lb) 87.3 kg (192 lb 6.4 oz)    Intake/Output: No intake/output data recorded.   Intake/Output this shift:  Total I/O In: 240 [P.O.:240] Out: -   Physical Exam: General: NAD, laying in bed  Head: Normocephalic, atraumatic. Moist oral mucosal membranes  Eyes: Anicteric, PERRL  Neck: Supple, trachea midline  Lungs:  Clear to auscultation  Heart: Regular rate and rhythm  Abdomen:  Soft, nontender,   Extremities: no peripheral edema.  Neurologic: Nonfocal, moving all four extremities  Skin: No lesions       Basic Metabolic Panel:  Recent Labs Lab 01/02/16 0214 01/02/16 0531  NA 141 139  K 3.7 4.0  CL 113* 112*  CO2 22 22  GLUCOSE 156* 145*  BUN 31* 29*  CREATININE 1.60* 1.44*  CALCIUM 9.0 8.8*    Liver Function Tests: No results for input(s): AST, ALT, ALKPHOS, BILITOT, PROT, ALBUMIN in the last 168 hours. No results for input(s): LIPASE, AMYLASE in the last 168 hours. No results for input(s): AMMONIA in the last 168 hours.  CBC:  Recent Labs Lab 01/02/16 0214 01/02/16 0531  WBC 7.7 7.6  HGB 11.1*  10.6*  HCT 32.0* 31.0*  MCV 88.9 89.3  PLT 202 198    Cardiac Enzymes:  Recent Labs Lab 01/02/16 0214 01/02/16 0531  TROPONINI 0.03* 0.03*    BNP: Invalid input(s): POCBNP  CBG:  Recent Labs Lab 01/02/16 0818  GLUCAP 139*    Microbiology: Results for orders placed or performed in visit on 10/25/15  Urine culture     Status: None   Collection Time: 10/25/15 10:04 AM  Result Value Ref Range Status   Colony Count NO GROWTH  Final   Organism ID, Bacteria NO GROWTH  Final    Coagulation Studies: No results for input(s): LABPROT, INR in the last 72 hours.  Urinalysis: No results for input(s): COLORURINE, LABSPEC, PHURINE, GLUCOSEU, HGBUR, BILIRUBINUR, KETONESUR, PROTEINUR, UROBILINOGEN, NITRITE, LEUKOCYTESUR in the last 72 hours.  Invalid input(s): APPERANCEUR    Imaging: Dg Chest 2 View  Result Date: 01/02/2016 CLINICAL DATA:  75 y/o F; chest pain and shortness of breath as well as back pain. EXAM: CHEST  2 VIEW COMPARISON:  Chest radiograph 10/23/2015 FINDINGS: Stable cardiac silhouette given differences in technique. Linear opacities at lung bases probably represent minor atelectasis. No consolidation, pneumothorax, or pleural effusion. Chronic left posterior lateral upper rib fractures. No acute osseous abnormality is identified. IMPRESSION: Bibasilar minor atelectasis. No acute cardiopulmonary process is identified. Electronically Signed   By: Kristine Garbe M.D.   On: 01/02/2016 02:48     Medications:     .  ALPRAZolam  0.5 mg Oral Daily  . aspirin  324 mg Oral NOW   Or  . aspirin  300 mg Rectal NOW  . [START ON 01/03/2016] aspirin EC  81 mg Oral Daily  . enoxaparin (LOVENOX) injection  40 mg Subcutaneous Q24H  . furosemide  20 mg Oral Daily  . insulin aspart  0-9 Units Subcutaneous TID WC  . isosorbide mononitrate  30 mg Oral Daily  . losartan  100 mg Oral Daily  . metoprolol tartrate  12.5 mg Oral BID  . mometasone-formoterol  2 puff  Inhalation BID  . PARoxetine  20 mg Oral Daily  . pregabalin  100 mg Oral TID  . rosuvastatin  20 mg Oral QHS  . sodium chloride flush  3 mL Intravenous Q12H  . ticagrelor  90 mg Oral BID  . [START ON 01/06/2016] Vitamin D (Ergocalciferol)  50,000 Units Oral Q7 days   sodium chloride, acetaminophen, albuterol, benzonatate, docusate sodium, fluticasone, ipratropium-albuterol, morphine injection, nitroGLYCERIN, polyethylene glycol, sodium chloride flush, traMADol  Assessment/ Plan:  Ms. Collier Pamela is a 75 y.o. black female with hypertension, insulin dependent diabetes mellitus type II, diabetic neuropathy, asthma/COPD, osteopenia, depression, hyperlipidemia   1. 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 Chronic kidney disease secondary to diabetic nephropathy and hypertension.  - Continue losartan for renal protection and blood pressure control.   2. Hypertension: with bradycardia on examination. Acute exacerbation of congestive heart failure, systolic - restarted furosemide.  - Current regimen of losartan, metoprolol, furosemide, and isosorbide mononitrate.  - Low salt diet.   3. Diabetes Mellitus type II with chronic kidney disease: insulin dependent.  - Continue glucose control   LOS: 0 Lashan Gluth 8/23/201712:20 PM

## 2016-01-02 NOTE — Progress Notes (Signed)
Patient is being discharge today as per MD order, patrient remaisn alert and oriented, discharge instruction provided, IV removed tele removed

## 2016-01-03 ENCOUNTER — Other Ambulatory Visit: Payer: Self-pay | Admitting: Internal Medicine

## 2016-01-03 NOTE — Telephone Encounter (Signed)
Patient has a follow up appointment scheduled 01/15/16.  Upcoming appointment does not qualify as TCM due to length of stay.

## 2016-01-05 NOTE — Discharge Summary (Signed)
[facility] [name]    MR#:  [id number]  DATE OF BIRTH:  08/03/1940  DATE OF ADMISSION:  01/02/2016 ADMITTING PHYSICIAN: Saundra Shelling, MD  DATE OF DISCHARGE: 01/02/2016  4:23 PM  PRIMARY CARE PHYSICIAN: [name], MD   ADMISSION DIAGNOSIS:  Unstable angina ([place]) [I20.0] Cardiomyopathy ([place]) [I42.9] Chest pain, unspecified chest pain type [R07.9] Coronary artery disease involving native coronary artery of native heart with unstable angina pectoris ([place]) [I25.110]  DISCHARGE DIAGNOSIS:  Active Problems:   Chest pain   Coronary artery disease involving native coronary artery of native heart with unstable angina pectoris ([place])   Type 2 diabetes mellitus with circulatory disorder, without long-term current use of insulin (HCC)   Chronic renal insufficiency   SECONDARY DIAGNOSIS:   Past Medical History:  Diagnosis Date  . Acute respiratory failure ([place]) 11/21/2014  . Arthritis 08/05/2013  . Atherosclerotic peripheral vascular disease with gangrene [facility]) august 2012  . Cardiomyopathy, ischemic    a. EF 35 to 40% by echo in 2013 b. EF improved to 50-55% by echo in 04/2015.  [name] Cerebral infarct ([place]) 08/17/2013  . Chronic diastolic CHF (congestive heart failure) (HCC)    a. EF 50-55% by echo in 04/2015  . COPD (chronic obstructive pulmonary disease) ([place])   . Coronary artery disease, occlusive    a. Previous PCI to the LAD, LCx, and RCA in 2010, 2011, 2013, and 2016. All performed in [place].  . 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 Diabetic diarrhea ([place]) 10/03/2014  . DM type 2, uncontrolled, with renal complications ([place]) [id number]  . Hepatic steatosis    by CT abd pelvis  . Hyperlipidemia LDL goal <100 02/23/2014  . Hypertensive heart disease   . Osteoporosis, post-menopausal   . 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 Renal insufficiency   . Sarcoidosis ([place])      ADMITTING HISTORY  HISTORY OF PRESENT ILLNESS: Pamela Collier  is a 75 y.o. female with a known history of Ischemic cardiomyopathy with EF of [id number], chronic diastolic heart failure, type 2 diabetes mellitus, COPD, coronary artery disease, hepatic steatosis, hyperlipidemia, osteoporosis, peripheral vascular disease presented to the emergency room for chest pain. The chest pain started last night when patient was at home. Pain is located in the left side of the chest and she had this dilated in pain which was 8 out of 10 on a scale of 1-10. Patient took nitroglycerin tablets at home and her chest pain came down to 5 out of 10 on a scale of 1-10 .No history of any orthopnea or proximal nocturnal dyspnea. Patient had an episode of shortness of breath and she experienced chest pain. Patient also  complains of back pain that is bothering her for the last couple of days. The back pain is located in the lower back, no history of any bowel or bladder incontinence. Patient also complains of numbness in both upper extremities and the hands and both feet of lower extremity. She has history of diabetes mellitus and says she has been compliant with medication. Patient follows up with [facility] cardiology as outpatient. EKG normal sinus rhythm with no ST segment changes and first set of troponin is 0.03. No history of any slurred speech. No complaints of headache, dizziness and blurry vision.  HOSPITAL COURSE:   * Atypical chest pain which is reproducible. Patient was seen by cardiology. With normal troponin and no significant EKG changes patient was thought to have noncardiac chest pain. Echocardiogram showed no significant changes. Restarted her Lasix which was recently stopped. Discussed with cardiology and nephrology prior to discharge. Pain medications at home to use as needed.  Discharged home in a stable condition to follow-up with her primary care physician and nephrology.  CONSULTS OBTAINED:  Treatment Team:  Minna Merritts, MD  DRUG ALLERGIES:   Allergies  Allergen Reactions  . Contrast Media [Iodinated Diagnostic Agents] Itching  . Gabapentin Itching  . Sulfa Antibiotics Itching    DISCHARGE MEDICATIONS:   Discharge Medication List as of 01/02/2016 11:59 AM    START taking these medications   Details  gabapentin (NEURONTIN) 100 MG capsule Take 1 capsule (100 mg total) by mouth at bedtime., Starting Wed 01/02/2016, Normal      CONTINUE these medications which have CHANGED   Details  furosemide (LASIX) 20 MG tablet Take 1 tablet (20 mg total) by mouth daily., Starting Wed 01/02/2016, Normal    metoprolol tartrate (LOPRESSOR) 25 MG tablet Take 0.5 tablets (12.5 mg total) by mouth 2 (two) times daily., Starting Wed 01/02/2016, No Print      CONTINUE these medications which have NOT CHANGED   Details  acetaminophen (TYLENOL) 500 MG tablet Take 500 mg by mouth every 6 (six) hours as needed for mild pain or headache., Until Discontinued, Historical Med    albuterol (PROAIR HFA) 108 (90 Base) MCG/ACT inhaler Inhale 2 puffs into the lungs every 6 (six) hours as needed for wheezing or shortness of breath., Starting 10/17/2015, Until Discontinued, Normal     albuterol (PROVENTIL) (2.5 MG/3ML) 0.083% nebulizer solution Take 3 mLs (2.5 mg total) by nebulization every 6 (six) hours as needed for wheezing or shortness of breath., Starting 12/06/2014, Until Discontinued, Normal    alendronate (FOSAMAX) 70 MG tablet Take 70 mg by mouth once a week. Pt takes on Sunday.   Take with a full glass of water on an empty stomach., Until Discontinued, Historical Med    ALPRAZolam (XANAX) 0.5 MG tablet Take 0.5 mg by mouth daily., Until Discontinued, Historical Med    aspirin EC 81 MG tablet Take 81 mg by mouth daily., Until Discontinued, Historical Med    benzonatate (TESSALON) 100 MG capsule Take 200 mg by mouth 3 (three) times daily as needed for cough. Reported on 11/09/2015, Until Discontinued, Historical Med    BRILINTA 90 MG TABS tablet Take 1 tablet (90 mg total) by mouth 2 (two) times daily., Starting 10/16/2015, Until Discontinued, Normal    budesonide-formoterol (SYMBICORT) 160-4.5 MCG/ACT inhaler Inhale 2 puffs into the lungs 2 (two) times daily., Starting 02/23/2015, Until Discontinued, Sample    fluticasone (FLONASE) 50 MCG/ACT nasal spray Place 2 sprays into both nostrils daily as needed for  rhinitis., Until Discontinued, Historical Med    Insulin Lispro Prot & Lispro (HUMALOG MIX 75/25 KWIKPEN) (75-25) 100 UNIT/ML Kwikpen Inject 45 to 60 units twice daily before morning and evening meals, Normal    Ipratropium-Albuterol (COMBIVENT RESPIMAT) 20-100 MCG/ACT AERS respimat Inhale 1 puff into the lungs 3 (three) times daily as needed for wheezing or shortness of breath. Reported on 10/25/2015, Until Discontinued, Historical Med    isosorbide mononitrate (IMDUR) 30 MG 24 hr tablet Take 30 mg by mouth daily., Until Discontinued, Historical Med    !! losartan (COZAAR) 100 MG tablet TAKE 1 TABLET DAILY, Normal    nitroGLYCERIN (NITROSTAT) 0.4 MG SL tablet Place 1 tablet (0.4 mg total) under the tongue every 5 (five) minutes as needed for chest pain.,  Starting 04/18/2015, Until Discontinued, Normal    polyethylene glycol (MIRALAX / GLYCOLAX) packet Take 17 g by mouth daily as needed for mild constipation. Reported on 10/25/2015, Until Discontinued, Historical Med    pregabalin (LYRICA) 100 MG capsule Take 1 capsule (100 mg total) by mouth 3 (three) times daily., Starting 10/19/2015, Until Discontinued, Print    rosuvastatin (CRESTOR) 20 MG tablet Take 20 mg by mouth at bedtime., Until Discontinued, Historical Med    Vitamin D, Ergocalciferol, (DRISDOL) 50000 units CAPS capsule Take 50,000 Units by mouth every 7 (seven) days. Pt takes on Sunday., Until Discontinued, Historical Med    PARoxetine (PAXIL) 20 MG tablet Take 1 tablet (20 mg total) by mouth daily., Starting 09/26/2015, Until Discontinued, Normal    B-D UF III MINI PEN NEEDLES 31G X 5 MM MISC Starting Mon 09/17/2015, Historical Med    docusate sodium (COLACE) 100 MG capsule Take 100 mg by mouth 2 (two) times daily as needed for mild constipation. Reported on 11/09/2015, Until Discontinued, Historical Med    levofloxacin (LEVAQUIN) 500 MG tablet Take 1 tablet (500 mg total) by mouth daily., Starting Fri 12/21/2015, Normal    ondansetron (ZOFRAN) 8 MG tablet Take 1 tablet (8 mg total) by mouth every 8 (eight) hours as needed for nausea or vomiting., Starting 10/25/2015, Until Discontinued, Normal    predniSONE (DELTASONE) 10 MG tablet 6 tablets on Day 1 , then reduce by 1 tablet daily until gone, Normal    traMADol (ULTRAM) 50 MG tablet Take 50 mg by mouth every 6 (six) hours as needed for moderate pain., Until Discontinued, Historical Med    !! losartan (COZAAR) 100 MG tablet Take 1 tablet (100 mg total) by mouth daily., Starting 10/15/2015, Until Discontinued, Normal     !! - Potential duplicate medications found. Please discuss with provider.      Today   VITAL SIGNS:  Blood pressure (!) 149/58, pulse (!) 58, temperature 98.5 F (36.9 C), temperature source Oral, resp. rate 20,  height 5\' 7"  (1.702 m), weight 87.3 kg (192 lb 6.4 oz), SpO2 100 %.  I/O:  No intake or output data in the 24 hours ending 01/05/16 1639  PHYSICAL EXAMINATION:  Physical Exam  GENERAL:  75 y.o.-year-old patient lying in the bed with no acute distress.  LUNGS: Normal breath sounds bilaterally, no wheezing, rales,rhonchi or crepitation. No use of accessory muscles of respiration.  CARDIOVASCULAR: S1, S2 normal. No murmurs, rubs, or gallops.  ABDOMEN: Soft, non-tender, non-distended. Bowel sounds present. No organomegaly or mass.  NEUROLOGIC: Moves all 4 extremities. PSYCHIATRIC: The patient is alert and oriented x 3.  SKIN: No obvious rash, lesion, or ulcer.   DATA REVIEW:   CBC  Recent Labs Lab 01/02/16  0531  WBC 7.6  HGB 10.6*  HCT 31.0*  PLT 198    Chemistries   Recent Labs Lab 01/02/16 0531  NA 139  K 4.0  CL 112*  CO2 22  GLUCOSE 145*  BUN 29*  CREATININE 1.44*  CALCIUM 8.8*    Cardiac Enzymes  Recent Labs Lab 01/02/16 1109  TROPONINI <0.03    Microbiology Results  Results for orders placed or performed in visit on 10/25/15  Urine culture     Status: None   Collection Time: 10/25/15 10:04 AM  Result Value Ref Range Status   Colony Count NO GROWTH  Final   Organism ID, Bacteria NO GROWTH  Final    RADIOLOGY:  No results found.  Follow up with PCP in 1 week.  Management plans discussed with the patient, family and they are in agreement.  CODE STATUS:  Code Status History    Date Active Date Inactive Code Status Order ID Comments User Context   01/02/2016  5:24 AM 01/02/2016  9:14 AM Full Code [id number]  Saundra Shelling, MD Inpatient   06/26/2015  7:36 AM 06/29/2015  7:54 PM Full Code [id number]  Demetrios Loll, MD ED   04/20/2015  1:18 AM 04/22/2015  7:18 PM Full Code [id number]  Lance Coon, MD Inpatient      TOTAL TIME TAKING CARE OF THIS PATIENT ON DAY OF DISCHARGE: more than 30 minutes.   Hillary Bow R M.D on 01/05/2016 at 4:39 PM  Between  7am to 6pm - Pager - [phone]  After 6pm go to [email or web address] - password [email or web address] [facility]  Office  [phone]  CC: Primary care physician; [name], MD  Note: This dictation was prepared with Dragon dictation along with smaller phrase technology. Any transcriptional errors that result from this process are unintentional.

## 2016-01-08 ENCOUNTER — Telehealth: Payer: Self-pay | Admitting: *Deleted

## 2016-01-08 ENCOUNTER — Encounter: Payer: Self-pay | Admitting: Emergency Medicine

## 2016-01-08 ENCOUNTER — Emergency Department: Payer: Medicare Other

## 2016-01-08 ENCOUNTER — Emergency Department
Admission: EM | Admit: 2016-01-08 | Discharge: 2016-01-08 | Disposition: A | Discharge status: Home / Self Care | Payer: Medicare Other | Attending: Emergency Medicine | Admitting: Emergency Medicine

## 2016-01-08 DIAGNOSIS — I2510 Atherosclerotic heart disease of native coronary artery without angina pectoris: Secondary | ICD-10-CM | POA: Insufficient documentation

## 2016-01-08 DIAGNOSIS — R0600 Dyspnea, unspecified: Secondary | ICD-10-CM

## 2016-01-08 DIAGNOSIS — J449 Chronic obstructive pulmonary disease, unspecified: Secondary | ICD-10-CM | POA: Diagnosis not present

## 2016-01-08 DIAGNOSIS — R0602 Shortness of breath: Secondary | ICD-10-CM | POA: Insufficient documentation

## 2016-01-08 DIAGNOSIS — Z794 Long term (current) use of insulin: Secondary | ICD-10-CM | POA: Diagnosis not present

## 2016-01-08 DIAGNOSIS — Z7982 Long term (current) use of aspirin: Secondary | ICD-10-CM | POA: Insufficient documentation

## 2016-01-08 DIAGNOSIS — I5032 Chronic diastolic (congestive) heart failure: Secondary | ICD-10-CM | POA: Diagnosis not present

## 2016-01-08 DIAGNOSIS — M549 Dorsalgia, unspecified: Secondary | ICD-10-CM | POA: Diagnosis not present

## 2016-01-08 DIAGNOSIS — I110 Hypertensive heart disease with heart failure: Secondary | ICD-10-CM | POA: Diagnosis not present

## 2016-01-08 DIAGNOSIS — Z79899 Other long term (current) drug therapy: Secondary | ICD-10-CM | POA: Diagnosis not present

## 2016-01-08 LAB — CBC WITH DIFFERENTIAL/PLATELET
Basophils Absolute: 0.1 10*3/uL (ref 0–0.1)
Basophils Relative: 1 %
Eosinophils Absolute: 0.1 10*3/uL (ref 0–0.7)
Eosinophils Relative: 2 %
HEMATOCRIT: 37.7 % (ref 35.0–47.0)
Hemoglobin: 12.7 g/dL (ref 12.0–16.0)
LYMPHS ABS: 1.1 10*3/uL (ref 1.0–3.6)
Lymphocytes Relative: 15 %
MCH: 30 pg (ref 26.0–34.0)
MCHC: 33.6 g/dL (ref 32.0–36.0)
MCV: 89.4 fL (ref 80.0–100.0)
MONO ABS: 0.7 10*3/uL (ref 0.2–0.9)
MONOS PCT: 9 %
NEUTROS ABS: 5.4 10*3/uL (ref 1.4–6.5)
NEUTROS PCT: 73 %
PLATELETS: 340 10*3/uL (ref 150–440)
RBC: 4.21 MIL/uL (ref 3.80–5.20)
RDW: 14.5 % (ref 11.5–14.5)
WBC: 7.3 10*3/uL (ref 3.6–11.0)

## 2016-01-08 LAB — BASIC METABOLIC PANEL
ANION GAP: 7 (ref 5–15)
BUN: 18 mg/dL (ref 6–20)
CALCIUM: 9.3 mg/dL (ref 8.9–10.3)
CO2: 24 mmol/L (ref 22–32)
CREATININE: 1.25 mg/dL — AB (ref 0.44–1.00)
Chloride: 109 mmol/L (ref 101–111)
GFR calc Af Amer: 48 mL/min — ABNORMAL LOW (ref >60)
GFR, EST NON AFRICAN AMERICAN: 41 mL/min — AB (ref >60)
GLUCOSE: 138 mg/dL — AB (ref 65–99)
Potassium: 3.7 mmol/L (ref 3.5–5.1)
Sodium: 140 mmol/L (ref 135–145)

## 2016-01-08 LAB — TROPONIN I: Troponin I: <0.03 ng/mL (ref <0.03)

## 2016-01-08 MED ORDER — HYDROXYZINE HCL 25 MG PO TABS
25.0000 mg | ORAL_TABLET | Freq: Once | ORAL | Status: AC
  Administered 2016-01-08: 25 mg via ORAL
  Filled 2016-01-08: qty 1

## 2016-01-08 MED ORDER — METOPROLOL TARTRATE 25 MG PO TABS
25.0000 mg | ORAL_TABLET | Freq: Once | ORAL | Status: AC
  Administered 2016-01-08: 25 mg via ORAL
  Filled 2016-01-08: qty 1

## 2016-01-08 MED ORDER — HYDROXYZINE HCL 10 MG PO TABS: 10.0000 mg | ORAL_TABLET | Freq: Three times a day (TID) | ORAL | 0 refills | Status: DC | PRN

## 2016-01-08 NOTE — Telephone Encounter (Signed)
Patient has concerns with her SOB and wheezing. She questioned if she should call cardiology. She was advised to call EMS because of the SOB, however she did nor want to call EMS. She Was transferred to the nurse line for symptoms . Pt requested to be advised by Dr. Derrel Nip

## 2016-01-08 NOTE — ED Provider Notes (Signed)
[facility] Emergency Department Provider Note  ____________________________________________   None    (approximate)  I have reviewed the triage vital signs and the nursing notes.   HISTORY  Chief Complaint Shortness of Breath  HPI Pamela Collier is a 75 y.o. female w h/o ischemic cardiomyopathy, EF 35% to 40% by echo in 2013;EF improved to 50-55% by echo in 04/2015, CAD status post stents, diabetes who was discharged 8/23 after being admitted for atypical chest pain and presents to the ER with SOB that is worse with exertion. She had an echocardiogram which showed no significant changes. Her Lasix was restarted.  Pt & spouse think Sx worsen when she is on medications.  She reports a nonproductive cough.  Has not had CP since last hospitalization.  Tolerating po, but decreased appetitie.    Past Medical History:  Diagnosis Date  . Acute respiratory failure ([place]) 11/21/2014  . Arthritis 08/05/2013  . Atherosclerotic peripheral vascular disease with gangrene [facility]) august 2012  . Cardiomyopathy, ischemic    a. EF 35 to 40% by echo in 2013 b. EF improved to 50-55% by echo in 04/2015.  [name] Cerebral infarct ([place]) 08/17/2013  . Chronic diastolic CHF (congestive heart failure) (HCC)    a. EF 50-55% by echo in 04/2015  . COPD (chronic obstructive pulmonary disease) ([place])   . Coronary artery disease, occlusive    a. Previous PCI to the LAD, LCx, and RCA in 2010, 2011, 2013, and 2016. All performed in [place].  . 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 Diabetic diarrhea ([place]) 10/03/2014  . DM type 2, uncontrolled, with renal complications ([place]) [id number]  . Hepatic steatosis    by CT abd pelvis  . Hyperlipidemia LDL goal <100 02/23/2014  . Hypertensive heart disease   . Osteoporosis, post-menopausal   . 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 Pleural effusion 10/25/2012   10/2012 CT chest  >> small to moderate R lung effusion>> chylothorax, 100% lymphs 10/2013 thoracentesis> cytology negative, WBC 1471, > 90% "small lymphs" 01/2014 CT chest> near complete resolution of pleural effusion, stable lymphadenopathy 08/2014 CT chest [place] ([facility]): small right sided effusion decreased in size, stable mediastinal lymphadenopathy 1.0cm largest, 93m  . 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 Renal insufficiency   . Sarcoidosis [facility])     Patient Active Problem List   Diagnosis Date Noted  . Chest pain 01/02/2016  . Coronary artery disease involving native coronary artery of native heart with unstable angina pectoris ([place])   . Type 2 diabetes mellitus with circulatory disorder, without long-term current use of insulin ([place])   . Chronic renal insufficiency   . Otitis media 12/23/2015  . Dehydration 10/28/2015  . Type 2 diabetes mellitus, uncontrolled, with neuropathy ([place]) 09/27/2015  . Shortness of breath 09/12/2015  . Hypertensive heart disease   . Coronary artery disease, occlusive   . OSA on CPAP 07/07/2015  . Hospital discharge follow-up 07/07/2015  . Chest pain with low risk of acute coronary syndrome 06/29/2015  . Community acquired pneumonia   . Pleural effusion, right   . Coronary artery disease involving native coronary  artery of native heart with angina pectoris ([place])   . Diarrhea 05/15/2015  . Fall 04/19/2015  . Acute on chronic kidney failure ([place]) 04/19/2015  . Chronic diastolic CHF (congestive heart failure) ([place]) 04/18/2015  . Diabetic neuropathy ([place]) 02/21/2015  . Benign neoplasm of sigmoid colon   . Diabetic diarrhea ([place]) 10/03/2014  . Low back pain with radiation 04/04/2014  . Essential hypertension 04/04/2014    . Hyperlipidemia 02/23/2014  . Long-term use of high-risk medication 02/23/2014  . Dermatophytic onychia 08/17/2013  . Angina pectoris ([place]) 08/17/2013  . Arteriosclerosis of coronary artery 08/17/2013  . Cerebral infarct ([place]) 08/17/2013  . Bony exostosis 08/17/2013  . Arthritis 08/05/2013  . History of colonic polyps 02/20/2013  . Sarcoidosis ([place]) 12/07/2012  . Dysphagia 12/07/2012  . Cough 10/25/2012  . Pulmonary nodule seen on imaging study 05/20/2012  . 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 Cardiomyopathy, ischemic   . Hepatic steatosis   . Osteoporosis, post-menopausal   . Peripheral vascular disease due to secondary diabetes mellitus ([place])   . DM (diabetes mellitus), type 2, uncontrolled, with renal complications ([place]) [id number]  . Double vessel coronary artery disease 12/14/2011    Past Surgical History:  Procedure Laterality Date  . ABDOMINAL HYSTERECTOMY     at ge 39. secondary to bleeding  . BREAST CYST ASPIRATION Right   . CARPAL TUNNEL RELEASE Bilateral   . CHOLECYSTECTOMY    . COLONOSCOPY WITH PROPOFOL N/A 01/09/2015   Procedure: COLONOSCOPY WITH PROPOFOL;  Surgeon: Lucilla Lame, MD;  Location: [facility] ENDOSCOPY;  Service: Endoscopy;  Laterality: N/A;  . CORONARY ANGIOPLASTY WITH STENT PLACEMENT     [place]; [facility]  . PTCA  August 2012   Right Posterior tibial artery , [place]  . TOE AMPUTATION  Sept 2012   Right 2nd toe, [place]    Prior to Admission medications   Medication Sig Start Date End Date Taking? Authorizing Provider  acetaminophen (TYLENOL) 500 MG tablet Take 500 mg by mouth every 6 (six) hours as needed for mild pain or headache.    Historical Provider, MD  albuterol (PROAIR HFA) 108 (90 Base) MCG/ACT inhaler Inhale 2 puffs into the lungs every 6 (six) hours as needed for wheezing or shortness of breath. 10/17/15   [name], MD  albuterol (PROVENTIL) (2.5  MG/3ML) 0.083% nebulizer solution Take 3 mLs (2.5 mg total) by nebulization every 6 (six) hours as needed for wheezing or shortness of breath. 12/06/14   [name], MD  alendronate (FOSAMAX) 70 MG tablet Take 70 mg by mouth once a week. Pt takes on Sunday.   Take with a full glass of water on an empty stomach.    Historical Provider, MD  ALPRAZolam [name]) 0.5 MG tablet Take 0.5 mg by mouth daily.    Historical Provider, MD  aspirin EC 81 MG tablet Take 81 mg by mouth daily.    Historical Provider, MD  B-D UF III MINI PEN NEEDLES 31G X 5 MM MISC  09/17/15   Historical Provider, MD  benzonatate (TESSALON) 100 MG capsule Take 200 mg by mouth 3 (three) times daily as needed for cough. Reported on 11/09/2015    Historical Provider, MD  BRILINTA 90 MG TABS tablet Take 1 tablet (90 mg total) by mouth 2 (two) times daily. 10/16/15   [name], MD  budesonide-formoterol (SYMBICORT) 160-4.5 MCG/ACT inhaler Inhale 2 puffs into the lungs 2 (two) times daily.  02/23/15   Juanito Doom, MD  docusate sodium (COLACE) 100 MG capsule Take 100 mg by mouth 2 (two) times daily as needed for mild constipation. Reported on 11/09/2015    Historical Provider, MD  fluticasone (FLONASE) 50 MCG/ACT nasal spray Place 2 sprays into both nostrils daily as needed for rhinitis.    Historical Provider, MD  furosemide (LASIX) 20 MG tablet Take 1 tablet (20 mg total) by mouth daily. 01/02/16   Hillary Bow, MD  gabapentin (NEURONTIN) 100 MG capsule Take 1 capsule (100 mg total) by mouth at bedtime. 01/02/16   Srikar Sudini, MD  Insulin Lispro Prot & Lispro (HUMALOG MIX 75/25 KWIKPEN) (75-25) 100 UNIT/ML Kwikpen Inject 45 to 60 units twice daily before morning and evening meals Patient taking differently: Inject 44-60 Units into the skin. Inject 45 to 60 units twice daily before morning and evening meals 11/27/15   [name], MD  Ipratropium-Albuterol (COMBIVENT RESPIMAT) 20-100 MCG/ACT AERS respimat Inhale 1 puff into the  lungs 3 (three) times daily as needed for wheezing or shortness of breath. Reported on 10/25/2015    Historical Provider, MD  isosorbide mononitrate (IMDUR) 30 MG 24 hr tablet Take 30 mg by mouth daily.    Historical Provider, MD  levofloxacin (LEVAQUIN) 500 MG tablet Take 1 tablet (500 mg total) by mouth daily. Patient not taking: Reported on 01/02/2016 12/21/15   [name], MD  losartan (COZAAR) 100 MG tablet TAKE 1 TABLET DAILY 12/19/15   [name], MD  metoprolol tartrate (LOPRESSOR) 25 MG tablet Take 0.5 tablets (12.5 mg total) by mouth 2 (two) times daily. 01/02/16   Hillary Bow, MD  nitroGLYCERIN (NITROSTAT) 0.4 MG SL tablet Place 1 tablet (0.4 mg total) under the tongue every 5 (five) minutes as needed for chest pain. 04/18/15   Minna Merritts, MD  ondansetron (ZOFRAN) 8 MG tablet Take 1 tablet (8 mg total) by mouth every 8 (eight) hours as needed for nausea or vomiting. Patient not taking: Reported on 01/02/2016 10/25/15   Colon Branch, MD  PARoxetine (PAXIL) 20 MG tablet TAKE ONE TABLET BY MOUTH ONCE DAILY 01/03/16   [name], MD  polyethylene glycol (MIRALAX / GLYCOLAX) packet Take 17 g by mouth daily as needed for mild constipation. Reported on 10/25/2015    Historical Provider, MD  predniSONE (DELTASONE) 10 MG tablet 6 tablets on Day 1 , then reduce by 1 tablet daily until gone Patient not taking: Reported on 01/02/2016 12/21/15   [name], MD  pregabalin (LYRICA) 100 MG capsule Take 1 capsule (100 mg total) by mouth 3 (three) times daily. 10/19/15   [name], MD  rosuvastatin (CRESTOR) 20 MG tablet Take 20 mg by mouth at bedtime.    Historical Provider, MD  traMADol (ULTRAM) 50 MG tablet Take 50 mg by mouth every 6 (six) hours as needed for moderate pain.    Historical Provider, MD  Vitamin D, Ergocalciferol, (DRISDOL) 50000 units CAPS capsule Take 50,000 Units by mouth every 7 (seven) days. Pt takes on Sunday.    Historical Provider, MD    Allergies Contrast media  [iodinated diagnostic agents]; Gabapentin; and Sulfa antibiotics  Family History  Problem Relation Age of Onset  . Heart disease Father   . Heart attack Father   . Heart disease Sister   . Heart attack Sister   . Heart disease Brother   . Heart attack Brother   . Asthma Grandchild   . Breast cancer  Maternal Aunt     Social History Social History  Substance Use Topics  . Smoking status: Never Smoker  . Smokeless tobacco: Never Used  . Alcohol use No    Review of Systems Constitutional: No fever/chills Cardiovascular: Denies chest pain. Respiratory: see hpi Gastrointestinal: No abdominal pain.  No nausea, no vomiting.   Musculoskeletal: Negative for back pain. C/o "crawly feeling" up her R arm x 3-4 mo, worse at night 10-point ROS otherwise negative.  ____________________________________________   PHYSICAL EXAM:  VITAL SIGNS: ED Triage Vitals  Enc Vitals Group     BP 01/08/16 1424 (!) 136/47     Pulse Rate 01/08/16 1424 (!) 58     Resp 01/08/16 1424 (!) 22     Temp 01/08/16 1424 98.1 F (36.7 C)     Temp Source 01/08/16 1424 Oral     SpO2 01/08/16 1424 100 %     Weight 01/08/16 1424 186 lb (84.4 kg)     Height 01/08/16 1424 5\' 7"  (1.702 m)     Head Circumference --      Peak Flow --      Pain Score 01/08/16 1425 8     Pain Loc --      Pain Edu? --      Excl. in [place]? --    Constitutional: Alert and oriented. Well appearing and in no acute distress. Has resting tremor. Eyes: Conjunctivae are normal. PERRL. EOMI. Head: Atraumatic. Nose: No congestion/rhinnorhea. Mouth/Throat: Mucous membranes are moist.  Oropharynx non-erythematous. Neck: No stridor. Cardiovascular: Normal rate, regular rhythm. Grossly normal heart sounds.  Good peripheral circulation. Respiratory: Normal respiratory effort.  No retractions. Lungs CTAB. Gastrointestinal: Soft and nontender. No distention. No abdominal bruits. No CVA tenderness. Musculoskeletal: No lower extremity tenderness  nor edema.  No joint effusions.; No back pain to palpation Neurologic:  Normal speech and language. No gross focal neurologic deficits are appreciated. No gait instability. Skin:  Skin is warm, dry and intact. No rash noted. Psychiatric: Mood and affect are normal. Speech and behavior are normal.  ____________________________________________   LABS (all labs ordered are listed, but only abnormal results are displayed)  Labs Reviewed  BASIC METABOLIC PANEL - Abnormal; Notable for the following:       Result Value   Glucose, Bld 138 (*)    Creatinine, Ser 1.25 (*)    GFR calc non Af Amer 41 (*)    GFR calc Af Amer 48 (*)    All other components within normal limits  CBC WITH DIFFERENTIAL/PLATELET  TROPONIN I   ____________________________________________  EKG   Date: 01/08/2016  Rate: 58  Rhythm: sinus bradycardia  QRS Axis: normal  Intervals: normal  ST/T Wave abnormalities: T inv I,avL  Conduction Disutrbances: none  Narrative Interpretation: unremarkable  ____________________________________________  RADIOLOGY  CXR-IMPRESSION: Focal atelectasis right lower lobe. No frank edema or consolidation. 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 There is aortic atherosclerosis. ____________________________________________  Procedures-none ____________________________________________   INITIAL IMPRESSION / ASSESSMENT AND PLAN / ED COURSE  Pertinent labs & imaging results that were available during my care of the patient were reviewed by me and considered in my medical decision making (see chart for details).  Patient was previously unaware of having any congestive heart failure. It does not appear that she is in any acute failure at this time. I suspect that her dyspnea on exertion could be related to this however, and I would like to refer patient back to cardiology  for consideration of cardiopulmonary rehabilitation.  Pt  also has appt with Dr. Ladona Horns 9/8.    Clinical Course   8:30p-I d/w Dr. Curt Bears, on-call for Dr. Rockey Situ, who will send DR. Gollan a note to get pt into heart failure clinic/consider cardiopulmonary rehab.  9p-Extensive d/w pt. She is frustrated that she keeps having SOB & coming to the ER & at times getting admitted, then leaving on medications & not feeling better.  I advised to continue this discussion with cardiology.  She is also c/o having intermittent low back pain x 3-4 months.  Husband wants her to try a heating pad but she has been scared because of having DM.  I advised she can use it for 20 min 2-3 x/day as long as he checks her skin.  She also has an incentive spirometer at home which I advised her to use more frequently.  She is also concerned she is itching since beginning paroxetine 8/24 which she has since stopped; will try hydroxyzine. ____________________________________________   FINAL CLINICAL IMPRESSION(S) / ED DIAGNOSES  Final diagnoses:  Shortness of breath      NEW MEDICATIONS STARTED DURING THIS VISIT:  New Prescriptions   No medications on file     Note:  This document was prepared using Dragon voice recognition software and may include unintentional dictation errors.   Ponciano Ort, MD 01/08/16 2114

## 2016-01-08 NOTE — Telephone Encounter (Signed)
Noted will follow, [facility] advised to go to ED.

## 2016-01-08 NOTE — Discharge Instructions (Signed)
Follow up with Dr. Rockey Situ (call tomorrow) to have your symptoms further evaluated & to find out if cardiopulmonary rehabilitation might be right for you.  Follow up with Dr. Derrel Nip about your back pain.  Return to the ER for new or worsening symptoms, chest pain, fever, or for any other concerns.

## 2016-01-08 NOTE — ED Notes (Signed)
Pt. Verbalizes understanding of d/c instructions, prescriptions, and follow-up. VS stable and pain controlled per pt.  Pt. In NAD at time of d/c and denies further concerns regarding this visit. Pt wheeled ut of the unit by this RN with husband walking at the side. Pt advised to return to the ED at any time for emergent concerns, or for new/worsening symptoms.

## 2016-01-08 NOTE — ED Triage Notes (Signed)
Pt arrived EMS from home complaining of shortness of breath and back pain. Pt states she took Inhaler this AM with no relief. Pt was started on new medication last week but started itching after taking it so called MD and was told to stop medication. Pt states the is more short of breath on exertion.

## 2016-01-12 NOTE — ED Provider Notes (Signed)
Telephone call from pharmacy the patient had expected a prescription for itching medication. I reviewed the patient's chart and she was indeed given hydroxyzine in the emergency department, and based on Dr. Latrelle Dodrill note it indicates perhaps the patient was going to try a prescription for hydroxyzine.  I did prescribe hydroxyzine 25 mg every 6 hours as needed for itching, dispense 20, directly to the pharmacy.     Lisa Roca, MD 01/12/16 1322

## 2016-01-15 ENCOUNTER — Encounter: Payer: Self-pay | Admitting: Internal Medicine

## 2016-01-15 ENCOUNTER — Ambulatory Visit (INDEPENDENT_AMBULATORY_CARE_PROVIDER_SITE_OTHER): Payer: Medicare Other | Admitting: Internal Medicine

## 2016-01-15 VITALS — BP 130/54 | HR 63 | Temp 98.3°F | Resp 10 | Ht 67.0 in | Wt 192.0 lb

## 2016-01-15 DIAGNOSIS — Z1239 Encounter for other screening for malignant neoplasm of breast: Secondary | ICD-10-CM

## 2016-01-15 DIAGNOSIS — R0602 Shortness of breath: Secondary | ICD-10-CM

## 2016-01-15 DIAGNOSIS — Z23 Encounter for immunization: Secondary | ICD-10-CM | POA: Diagnosis not present

## 2016-01-15 DIAGNOSIS — R0600 Dyspnea, unspecified: Secondary | ICD-10-CM

## 2016-01-15 DIAGNOSIS — I200 Unstable angina: Secondary | ICD-10-CM

## 2016-01-15 NOTE — Assessment & Plan Note (Signed)
She has no signs of desaturation or ischemia by recent ER evaluation  Discussed referral to cardiopulmonary rehabilitation , whch I have done.  Pulm and  cardiology follow ups are in place

## 2016-01-15 NOTE — Progress Notes (Signed)
Pre-visit discussion using our clinic review tool. No additional management support is needed unless otherwise documented below in the visit note.  

## 2016-01-15 NOTE — Patient Instructions (Signed)
I AM REFERRING YOU TO THE HOSPITAL'S "CARDIOPULMONARY REHAB PROGRAM"  This is physical therapy for people with heart and lung problems  Mammogram has been ordered   Return in November for diabetes followup

## 2016-01-15 NOTE — Progress Notes (Signed)
Subjective:  Patient ID: Pamela Collier, female    DOB: [birth year]  Age: 75 y.o. MRN: [id number]  CC: The primary encounter diagnosis was Dyspnea. Diagnoses of Breast cancer screening and Shortness of breath were also pertinent to this visit.  HPI Pamela Collier presents for hospital follow up,  Admitted for one day ()OBS status) on August 23 to [facility] for chest pain diagnosed as [place].  Troponins were negative,  No EKG changes ,  EF WAS 55 TO 60% BY REPEAT ECHO .  She was discharged same day and advised to resume daily use of lasix   Returned to ED on Aug 29 with dyspnea.  Chest x ray suggested RLL ATX without edema or consolidation .   No treatment was given.  Weight was down 6 lbs from aug 23 wirh use of Lasix daily     "I had chest pain before I came here today."  Patient is frustrated at her failure to improve despite multiple evaluations and hospitalizations  Over the last 6 months .  She Has appt with Rockey Situ on Sept 7 and with Pulmonolgoy Sept 14th    patient requesting  A breast exam. Last mammo Dec 2015   Discussed referral to cardiopulmonary rehab ,  She is receptive to the idea.    Lab Results  Component Value Date   HGBA1C 7.7 12/13/2015    Outpatient Medications Prior to Visit  Medication Sig Dispense Refill  . acetaminophen (TYLENOL) 500 MG tablet Take 500 mg by mouth every 6 (six) hours as needed for mild pain or headache.    . albuterol (PROAIR HFA) 108 (90 Base) MCG/ACT inhaler Inhale 2 puffs into the lungs every 6 (six) hours as needed for wheezing or shortness of breath. 1 Inhaler 2  . albuterol (PROVENTIL) (2.5 MG/3ML) 0.083% nebulizer solution Take 3 mLs (2.5 mg total) by nebulization every 6 (six) hours as needed for wheezing or shortness of breath. 150 mL 1  . alendronate (FOSAMAX) 70 MG tablet Take 70 mg by mouth once a week. Pt takes on Sunday.   Take with a full glass of water on an empty stomach.    . ALPRAZolam (XANAX) 0.5 MG tablet Take 0.5 mg by mouth  daily.    [name] aspirin EC 81 MG tablet Take 81 mg by mouth daily.    . B-D UF III MINI PEN NEEDLES 31G X 5 MM MISC     . benzonatate (TESSALON) 100 MG capsule Take 200 mg by mouth 3 (three) times daily as needed for cough. Reported on 11/09/2015    . BRILINTA 90 MG TABS tablet Take 1 tablet (90 mg total) by mouth 2 (two) times daily. 180 tablet 2  . budesonide-formoterol (SYMBICORT) 160-4.5 MCG/ACT inhaler Inhale 2 puffs into the lungs 2 (two) times daily. 1 Inhaler 0  . docusate sodium (COLACE) 100 MG capsule Take 100 mg by mouth 2 (two) times daily as needed for mild constipation. Reported on 11/09/2015    . fluticasone (FLONASE) 50 MCG/ACT nasal spray Place 2 sprays into both nostrils daily as needed for rhinitis.    . furosemide (LASIX) 20 MG tablet Take 1 tablet (20 mg total) by mouth daily. 90 tablet 1  . gabapentin (NEURONTIN) 100 MG capsule Take 1 capsule (100 mg total) by mouth at bedtime. 30 capsule 0  . hydrOXYzine (ATARAX/VISTARIL) 10 MG tablet Take 1 tablet (10 mg total) by mouth 3 (three) times daily as needed. 30 tablet 0  . Insulin Lispro  Prot & Lispro (HUMALOG MIX 75/25 KWIKPEN) (75-25) 100 UNIT/ML Kwikpen Inject 45 to 60 units twice daily before morning and evening meals (Patient taking differently: Inject 44-60 Units into the skin. Inject 45 to 60 units twice daily before morning and evening meals) 112 mL 3  . Ipratropium-Albuterol (COMBIVENT RESPIMAT) 20-100 MCG/ACT AERS respimat Inhale 1 puff into the lungs 3 (three) times daily as needed for wheezing or shortness of breath. Reported on 10/25/2015    . isosorbide mononitrate (IMDUR) 30 MG 24 hr tablet Take 30 mg by mouth daily.    [name] losartan (COZAAR) 100 MG tablet TAKE 1 TABLET DAILY 90 tablet 1  . metoprolol tartrate (LOPRESSOR) 25 MG tablet Take 0.5 tablets (12.5 mg total) by mouth 2 (two) times daily.    . nitroGLYCERIN (NITROSTAT) 0.4 MG SL tablet Place 1 tablet (0.4 mg total) under the tongue every 5 (five) minutes as needed for  chest pain. 25 tablet 3  . ondansetron (ZOFRAN) 8 MG tablet Take 1 tablet (8 mg total) by mouth every 8 (eight) hours as needed for nausea or vomiting. 15 tablet 0  . PARoxetine (PAXIL) 20 MG tablet TAKE ONE TABLET BY MOUTH ONCE DAILY 30 tablet 1  . polyethylene glycol (MIRALAX / GLYCOLAX) packet Take 17 g by mouth daily as needed for mild constipation. Reported on 10/25/2015    . pregabalin (LYRICA) 100 MG capsule Take 1 capsule (100 mg total) by mouth 3 (three) times daily. 270 capsule 2  . rosuvastatin (CRESTOR) 20 MG tablet Take 20 mg by mouth at bedtime.    . traMADol (ULTRAM) 50 MG tablet Take 50 mg by mouth every 6 (six) hours as needed for moderate pain.    . Vitamin D, Ergocalciferol, (DRISDOL) 50000 units CAPS capsule Take 50,000 Units by mouth every 7 (seven) days. Pt takes on Sunday.    . levofloxacin (LEVAQUIN) 500 MG tablet Take 1 tablet (500 mg total) by mouth daily. (Patient not taking: Reported on 01/02/2016) 7 tablet 0  . predniSONE (DELTASONE) 10 MG tablet 6 tablets on Day 1 , then reduce by 1 tablet daily until gone (Patient not taking: Reported on 01/02/2016) 21 tablet 0   Facility-Administered Medications Prior to Visit  Medication Dose Route Frequency Provider Last Rate Last Dose  . albuterol (PROVENTIL) (5 MG/ML) 0.5% nebulizer solution 2.5 mg  2.5 mg Nebulization Once [name], MD        Review of Systems;  Patient denies headache, fevers, malaise, unintentional weight loss, skin rash, eye pain, sinus congestion and sinus pain, sore throat, dysphagia,  hemoptysis , cough, dyspnea, wheezing, chest pain, palpitations, orthopnea, edema, abdominal pain, nausea, melena, diarrhea, constipation, flank pain, dysuria, hematuria, urinary  Frequency, nocturia, numbness, tingling, seizures,  Focal weakness, Loss of consciousness,  Tremor, insomnia, depression, anxiety, and suicidal ideation.      Objective:  BP (!) 130/54   Pulse 63   Temp 98.3 F (36.8 C) (Oral)   Resp 10    Ht 5\' 7"  (1.702 m)   Wt 192 lb (87.1 kg)   SpO2 96%   BMI 30.07 kg/m   BP Readings from Last 3 Encounters:  01/15/16 (!) 130/54  01/08/16 (!) 144/89  01/02/16 (!) 149/58    Wt Readings from Last 3 Encounters:  01/15/16 192 lb (87.1 kg)  01/08/16 186 lb (84.4 kg)  01/02/16 192 lb 6.4 oz (87.3 kg)    General appearance: alert, cooperative and appears stated age Ears: normal TM's and external ear canals  both ears Throat: lips, mucosa, and tongue normal; teeth and gums normal Neck: no adenopathy, no carotid bruit, supple, symmetrical, trachea midline and thyroid not enlarged, symmetric, no tenderness/mass/nodules Back: symmetric, no curvature. ROM normal. No CVA tenderness. Lungs: clear to auscultation bilaterally Heart: regular rate and rhythm, S1, S2 normal, no murmur, click, rub or gallop Abdomen: soft, non-tender; bowel sounds normal; no masses,  no organomegaly Pulses: 2+ and symmetric Skin: Skin color, texture, turgor normal. No rashes or lesions Lymph nodes: Cervical, supraclavicular, and axillary nodes normal.  Lab Results  Component Value Date   HGBA1C 7.7 12/13/2015   HGBA1C 9.6 (H) 09/26/2015   HGBA1C 7.8 (H) 06/08/2015    Lab Results  Component Value Date   CREATININE 1.25 (H) 01/08/2016   CREATININE 1.44 (H) 01/02/2016   CREATININE 1.60 (H) 01/02/2016    Lab Results  Component Value Date   WBC 7.3 01/08/2016   HGB 12.7 01/08/2016   HCT 37.7 01/08/2016   PLT 340 01/08/2016   GLUCOSE 138 (H) 01/08/2016   CHOL 123 01/02/2016   TRIG 151 (H) 01/02/2016   HDL 29 (L) 01/02/2016   LDLDIRECT 73.0 09/26/2015   LDLCALC 64 01/02/2016   ALT 13 11/06/2015   AST 14 11/06/2015   NA 140 01/08/2016   K 3.7 01/08/2016   CL 109 01/08/2016   CREATININE 1.25 (H) 01/08/2016   BUN 18 01/08/2016   CO2 24 01/08/2016   TSH 1.16 07/01/2013   INR 1.2 (H) 02/02/2015   HGBA1C 7.7 12/13/2015   MICROALBUR 28.9 (H) 09/26/2015    Dg Chest 2 View  Result Date:  01/08/2016 CLINICAL DATA:  Shortness of Breath EXAM: CHEST  2 VIEW COMPARISON:  January 02, 2016 FINDINGS: There is focal atelectatic change in the right lower lobe. There is no frank edema or consolidation. Heart size is upper normal. 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 No adenopathy. There is atherosclerotic calcification in the aortic arch. There is an old healed fracture of the left lateral fifth rib. IMPRESSION: Focal atelectasis right lower lobe. No frank edema or consolidation. 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 There is aortic atherosclerosis. Electronically Signed   By: Lowella Grip III M.D.   On: 01/08/2016 14:55    Assessment & Plan:   Problem List Items Addressed This Visit    Shortness of breath    She has no signs of desaturation or ischemia by recent ER evaluation  Discussed referral to cardiopulmonary rehabilitation , whch I have done.  Pulm and  cardiology follow ups are in place        Other Visit Diagnoses    Dyspnea    -  Primary   Relevant Orders   Ambulatory referral to Physical Therapy   Breast cancer screening       Relevant Orders   MM DIGITAL SCREENING BILATERAL      I have discontinued Ms. Rockefeller levofloxacin and predniSONE. I am also having her maintain her albuterol, budesonide-formoterol, nitroGLYCERIN, BRILINTA, albuterol, pregabalin, acetaminophen, alendronate, ALPRAZolam, benzonatate, Ipratropium-Albuterol, docusate sodium, Vitamin D (Ergocalciferol), fluticasone, isosorbide mononitrate, rosuvastatin, polyethylene glycol, traMADol, aspirin EC, ondansetron, Insulin Lispro Prot & Lispro, B-D UF III MINI PEN NEEDLES, losartan, furosemide, gabapentin, metoprolol tartrate, PARoxetine, and hydrOXYzine.  No orders of the defined types were placed in this encounter.   A total of 25 minutes of face to face time was spent with patient more than half of which  was spent in  reviewing the recent hospital and ER records, counselling about the above mentioned conditions  and coordination of care   Medications Discontinued During This Encounter  Medication Reason  . levofloxacin (LEVAQUIN) 500 MG tablet Completed Course  . predniSONE (DELTASONE) 10 MG tablet Completed Course    Follow-up: No Follow-up on file.   [name], MD

## 2016-01-16 ENCOUNTER — Other Ambulatory Visit: Payer: Self-pay | Admitting: Internal Medicine

## 2016-01-17 ENCOUNTER — Telehealth: Payer: Self-pay | Admitting: Cardiovascular Disease

## 2016-01-17 ENCOUNTER — Ambulatory Visit (INDEPENDENT_AMBULATORY_CARE_PROVIDER_SITE_OTHER): Payer: Medicare Other | Admitting: Cardiovascular Disease

## 2016-01-17 ENCOUNTER — Encounter: Payer: Self-pay | Admitting: Cardiovascular Disease

## 2016-01-17 VITALS — BP 130/54 | HR 61 | Ht 67.0 in | Wt 192.0 lb

## 2016-01-17 DIAGNOSIS — I25119 Atherosclerotic heart disease of native coronary artery with unspecified angina pectoris: Secondary | ICD-10-CM | POA: Diagnosis not present

## 2016-01-17 DIAGNOSIS — R079 Chest pain, unspecified: Secondary | ICD-10-CM | POA: Diagnosis not present

## 2016-01-17 DIAGNOSIS — I200 Unstable angina: Secondary | ICD-10-CM

## 2016-01-17 DIAGNOSIS — R0602 Shortness of breath: Secondary | ICD-10-CM | POA: Diagnosis not present

## 2016-01-17 DIAGNOSIS — I209 Angina pectoris, unspecified: Secondary | ICD-10-CM | POA: Diagnosis not present

## 2016-01-17 NOTE — Telephone Encounter (Signed)
Pt is here to see Dr. Rockey Situ - being roomed now.

## 2016-01-17 NOTE — Patient Instructions (Addendum)
Medication Instructions:   No medication changes made Call when you run out of Brilinta and we will send in prescription for Plavix 75 mg daily  Labwork:  No new labs needed  Testing/Procedures:  We will schedule a lexiscan myoview for chest pain, SOB, known CAD and hx of stents  [place] caregiver has ordered a Stress Test with nuclear imaging. The purpose of this test is to evaluate the blood supply to your heart muscle. This procedure is referred to as a "Non-Invasive Stress Test." This is because other than having an IV started in your vein, nothing is inserted or "invades" your body. Cardiac stress tests are done to find areas of poor blood flow to the heart by determining the extent of coronary artery disease (CAD). Some patients exercise on a treadmill, which naturally increases the blood flow to your heart, while others who are  unable to walk on a treadmill due to physical limitations have a pharmacologic/chemical stress agent called Lexiscan . This medicine will mimic walking on a treadmill by temporarily increasing your coronary blood flow.   Please note: these test may take anywhere between 2-4 hours to complete  PLEASE REPORT TO [facility] AT THE FIRST DESK WILL DIRECT YOU WHERE TO GO  Date of Procedure:____Tuesday, September 19________  Arrival Time for Procedure:____8:15 am_______________  Instructions regarding medication:   __X__ : Hold diabetes medication morning of procedure  __X__:  Hold METOPROLOL the night before and morning of procedure  How to prepare for your Myoview test:  1. Do not eat or drink after midnight 2. No caffeine for 24 hours prior to test 3. No smoking 24 hours prior to test. 4. Your medication may be taken with water.  If your doctor stopped a medication because of this test, do not take that medication. 5. Ladies, please do not wear dresses.  Skirts or pants are appropriate. Please wear a short  sleeve shirt. 6. No perfume, cologne or lotion.  Follow-Up: It was a pleasure seeing you in the office today. Please call us if you have new issues that need to be addressed before your next appt.  [phone]  Your physician wants you to follow-up in: 6 months.  You will receive a reminder letter in the mail two months in advance. If you don't receive a letter, please call our office to schedule the follow-up appointment.  If you need a refill on your cardiac medications before your next appointment, please call your pharmacy.     Cardiac Nuclear Scanning A cardiac nuclear scan is used to check your heart for problems, such as the following:  A portion of the heart is not getting enough blood.  Part of the heart muscle has died, which happens with a heart attack.  The heart wall is not working normally.  In this test, a radioactive dye (tracer) is injected into your bloodstream. After the tracer has traveled to your heart, a scanning device is used to measure how much of the tracer is absorbed by or distributed to various areas of your heart. LET [facility] CARE PROVIDER KNOW ABOUT:  Any allergies you have.  All medicines you are taking, including vitamins, herbs, eye drops, creams, and over-the-counter medicines.  Previous problems you or members of your family have had with the use of anesthetics.  Any blood disorders you have.  Previous surgeries you have had.  Medical conditions you have.  RISKS AND COMPLICATIONS Generally, this is a  safe procedure. However, as with any procedure, problems can occur. Possible problems include:   Serious chest pain.  Rapid heartbeat.  Sensation of warmth in your chest. This usually passes quickly. BEFORE THE PROCEDURE Ask your health care provider about changing or stopping your regular medicines. PROCEDURE This procedure is usually done at a hospital and takes 2-4 hours.  An IV tube is inserted into one of your  veins.  Your health care provider will inject a small amount of radioactive tracer through the tube.  You will then wait for 20-40 minutes while the tracer travels through your bloodstream.  You will lie down on an exam table so images of your heart can be taken. Images will be taken for about 15-20 minutes.  You will exercise on a treadmill or stationary bike. While you exercise, your heart activity will be monitored with an electrocardiogram (ECG), and your blood pressure will be checked.  If you are unable to exercise, you may be given a medicine to make your heart beat faster.  When blood flow to your heart has peaked, tracer will again be injected through the IV tube.  After 20-40 minutes, you will get back on the exam table and have more images taken of your heart.  When the procedure is over, your IV tube will be removed. AFTER THE PROCEDURE  You will likely be able to leave shortly after the test. Unless your health care provider tells you otherwise, you may return to your normal schedule, including diet, activities, and medicines.  Make sure you find out how and when you will get your test results.   This information is not intended to replace advice given to you by your health care provider. Make sure you discuss any questions you have with your health care provider.   Document Released: 05/23/2004 Document Revised: 05/03/2013 Document Reviewed: 04/06/2013 Elsevier Interactive Patient Education [employer].

## 2016-01-17 NOTE — Telephone Encounter (Signed)
Pt c/o of Chest Pain: STAT if CP now or developed within 24 hours  1. Are you having CP right now? yes  2. Are you experiencing any other symptoms (ex. SOB, nausea, vomiting, sweating)? Nauseated last night   3. How long have you been experiencing CP? Feeling at [facility] office Tuesday   4. Is your CP continuous or coming and going? Comes and goes   5. Have you taken Nitroglycerin? No  No other medicine   ?

## 2016-01-17 NOTE — Progress Notes (Signed)
Cardiology Office Note  Date:  01/17/2016   ID:  CORDELL Collier, DOB [birth year], MRN [id number]  PCP:  [name], MD   Chief Complaint  Patient presents with  . Other    follow up from [facility] ER; chest pain. Pt. still c/o chest pain today, the chest pain comes and goes. Meds reviewed by the patient verbally.     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 [place] Bells with moderate airflow restriction, mild restrictive disease on PFTs in 2014, chronic wheezing requiring albuterol nebulizer, pleural effusion presenting 01/11/2015 on CT scan on the right that appear to resolve on follow-up x-rays the end of September 2016, who presents shortness of breath, chest pain  Several visits to the hospital and emergency room for various issues Chronic shortness of breath, chest pain, general malaise  Our notes indicate wgt up 6 pounds in last month Increasing SOB.  CXR in ER: 01/08/16: mild CHF CBC nl, troponin neg, Creatinine 1.25, BUN 18, creatinine improved down from 1.4  Reports that she is Back on lasix one a day Total chol 123, ldl 64 HBA1C 7.7  Sleeps with CPAP, not working? Choking, takes sips of water, Uses in the day when napping,  Only uses it for 4 hours a day today  Waxing waning abdominal bloating, not severe "not bad today" Previously went abdominal bloating was severe she had fluid overload, pleural effusion Nausea last night Some chest pains, worse on exertion Anxious, h/a  Previous Echo 01/02/16 reviewed, normal EF, and RVSP Inferior wall HK  EKG on today's visit shows normal sinus rhythm with rate 61 bpm, LVH with nonspecific ST-T wave abnormality 1 and aVL, unchanged from previous EKG  Other past medical  history  echocardiogram May 2016 showed normal ejection fraction, This was done in [place], no mention of right heart pressures. Prior echocardiogram at the end of 2015 done at [facility] documented ejection fraction 45%  CT scan images reviewed with her in detail. CT from 01/11/2015 showed mild to moderate sized pleural effusion on the right extending from base to apex in a supine position. Repeat chest x-ray at the end of September showed no significant effusion  Symptoms seem to present after she had colonoscopy, possibly had IV fluids during the procedure, symptoms improved on higher dose Lasix  She does report having pleural effusions in the past requiring thoracentesis, this was also documented in note by pulmonary No malignancy noted on previous evaluation  Cardiac catheterization from January 2013 documenting ejection fraction 35-40%, 40% mid LAD, 50% distal LAD, small diagonal with 70% disease, 50% proximal left circumflex, 75% distal left circumflex with patent stent, small RCA, calcified, severe atherosclerosis, 90% distal RCA  Details from catheterization March 2016 are not available, request made to hospital in [place] on today's visit for records   PMH:   has a past medical history of Acute respiratory failure ([place]) (11/21/2014); Arthritis (08/05/2013); Atherosclerotic peripheral vascular disease with gangrene [facility]) (august 2012); Cardiomyopathy, ischemic; Cerebral infarct ([place]) (08/17/2013); Chronic diastolic CHF (congestive heart failure) (HCC); COPD (chronic obstructive pulmonary disease) ([place]); Coronary artery disease, occlusive; 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) (8/0/9983); Hepatic steatosis; Hyperlipidemia LDL goal <100 (02/23/2014); Hypertensive  heart disease; Osteoporosis, post-menopausal; Peripheral vascular disease due to secondary diabetes mellitus [facility]) (July 2011); Pleural  effusion (10/25/2012); Pulmonary sarcoidosis ([place]) (12/07/2012); Renal insufficiency; and Sarcoidosis ([place]).  PSH:    Past Surgical History:  Procedure Laterality Date  . ABDOMINAL HYSTERECTOMY     at ge 39. secondary to bleeding  . BREAST CYST ASPIRATION Right   . CARPAL TUNNEL RELEASE Bilateral   . CHOLECYSTECTOMY    . COLONOSCOPY WITH PROPOFOL N/A 01/09/2015   Procedure: COLONOSCOPY WITH PROPOFOL;  Surgeon: Lucilla Lame, MD;  Location: [facility] ENDOSCOPY;  Service: Endoscopy;  Laterality: N/A;  . CORONARY ANGIOPLASTY WITH STENT PLACEMENT     [place]; [facility]  . PTCA  August 2012   Right Posterior tibial artery , [place]  . 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 acetaminophen (TYLENOL) 500 MG tablet Take 500 mg by mouth every 6 (six) hours as needed for mild pain or headache.    . albuterol (PROAIR HFA) 108 (90 Base) MCG/ACT inhaler Inhale 2 puffs into the lungs every 6 (six) hours as needed for wheezing or shortness of breath. 1 Inhaler 2  . albuterol (PROVENTIL) (2.5 MG/3ML) 0.083% nebulizer solution Take 3 mLs (2.5 mg total) by nebulization every 6 (six) hours as needed for wheezing or shortness of breath. 150 mL 1  . alendronate (FOSAMAX) 70 MG tablet Take 70 mg by mouth once a week. Pt takes on Sunday.   Take with a full glass of water on an empty stomach.    . ALPRAZolam (XANAX) 0.5 MG tablet Take 0.5 mg by mouth daily.    [name] aspirin EC 81 MG tablet Take 81 mg by mouth daily.    . B-D UF III MINI PEN NEEDLES 31G X 5 MM MISC     . benzonatate (TESSALON) 100 MG capsule Take 200 mg by mouth 3 (three) times daily as needed for cough. Reported on 11/09/2015    . BRILINTA 90 MG TABS tablet Take 1 tablet (90 mg total) by mouth 2 (two) times daily. 180 tablet 2  . budesonide-formoterol (SYMBICORT) 160-4.5 MCG/ACT inhaler Inhale 2 puffs into the lungs 2 (two) times daily. 1 Inhaler 0  .  docusate sodium (COLACE) 100 MG capsule Take 100 mg by mouth 2 (two) times daily as needed for mild constipation. Reported on 11/09/2015    . fluticasone (FLONASE) 50 MCG/ACT nasal spray Place 2 sprays into both nostrils daily as needed for rhinitis.    . furosemide (LASIX) 20 MG tablet Take 1 tablet (20 mg total) by mouth daily. 90 tablet 1  . gabapentin (NEURONTIN) 100 MG capsule Take 1 capsule (100 mg total) by mouth at bedtime. 30 capsule 0  . hydrOXYzine (ATARAX/VISTARIL) 10 MG tablet Take 1 tablet (10 mg total) by mouth 3 (three) times daily as needed. 30 tablet 0  . Insulin Lispro Prot & Lispro (HUMALOG MIX 75/25 KWIKPEN) (75-25) 100 UNIT/ML Kwikpen Inject 45 to 60 units twice daily before morning and evening meals (Patient taking differently: Inject 44-60 Units into the skin. Inject 45 to 60 units twice daily before morning and evening meals) 112 mL 3  . Ipratropium-Albuterol (COMBIVENT RESPIMAT) 20-100 MCG/ACT AERS respimat Inhale 1 puff into the lungs 3 (three) times daily as needed for wheezing or shortness of breath. Reported on 10/25/2015    . isosorbide mononitrate (IMDUR) 30 MG 24 hr tablet TAKE 1 TABLET  DAILY 90 tablet 1  . losartan (COZAAR) 100 MG tablet TAKE 1 TABLET DAILY 90 tablet 1  . metoprolol tartrate (LOPRESSOR) 25 MG tablet Take 0.5 tablets (12.5 mg total) by mouth 2 (two) times daily.    . nitroGLYCERIN (NITROSTAT) 0.4 MG SL tablet Place 1 tablet (0.4 mg total) under the tongue every 5 (five) minutes as needed for chest pain. 25 tablet 3  . ondansetron (ZOFRAN) 8 MG tablet Take 1 tablet (8 mg total) by mouth every 8 (eight) hours as needed for nausea or vomiting. 15 tablet 0  . PARoxetine (PAXIL) 20 MG tablet TAKE ONE TABLET BY MOUTH ONCE DAILY 30 tablet 1  . polyethylene glycol (MIRALAX / GLYCOLAX) packet Take 17 g by mouth daily as needed for mild constipation. Reported on 10/25/2015    . pregabalin (LYRICA) 100 MG capsule Take 1 capsule (100 mg total) by mouth 3 (three) times  daily. 270 capsule 2  . rosuvastatin (CRESTOR) 20 MG tablet Take 20 mg by mouth at bedtime.    . traMADol (ULTRAM) 50 MG tablet Take 50 mg by mouth every 6 (six) hours as needed for moderate pain.    . Vitamin D, Ergocalciferol, (DRISDOL) 50000 units CAPS capsule Take 50,000 Units by mouth every 7 (seven) days. Pt takes on Sunday.     No current facility-administered medications for this visit.    Facility-Administered Medications Ordered in Other Visits  Medication Dose Route Frequency Provider Last Rate Last Dose  . albuterol (PROVENTIL) (5 MG/ML) 0.5% nebulizer solution 2.5 mg  2.5 mg Nebulization Once [name], MD         Allergies:   Contrast media [iodinated diagnostic agents]; Gabapentin; and Sulfa antibiotics   Social History:  The patient  reports that she has never smoked. She has never used smokeless tobacco. She reports that she does not drink alcohol or use drugs.   Family History:   family history includes Asthma in her grandchild; Breast cancer in her maternal aunt; Heart attack in her brother, father, and sister; Heart disease in her brother, father, and sister.    Review of Systems: Review of Systems  Constitutional: Positive for malaise/fatigue.  Respiratory: Positive for shortness of breath.   Cardiovascular: Positive for chest pain.  Gastrointestinal: Negative.   Musculoskeletal: Negative.   Neurological: Positive for weakness.  Psychiatric/Behavioral: Negative.   All other systems reviewed and are negative.    PHYSICAL EXAM: VS:  BP (!) 130/54 (BP Location: Right Arm, Patient Position: Sitting, Cuff Size: Normal)   Pulse 61   Ht 5\' 7"  (1.702 m)   Wt 192 lb (87.1 kg)   BMI 30.07 kg/m  , BMI Body mass index is 30.07 kg/m. GEN: Well nourished, well developed, in no acute distress  HEENT: normal  Neck: no JVD, carotid bruits, or masses Cardiac: RRR; no murmurs, rubs, or gallops,no edema  Respiratory:  clear to auscultation bilaterally, normal work  of breathing GI: soft, nontender, nondistended, + BS MS: no deformity or atrophy  Skin: warm and dry, no rash Neuro:  Strength and sensation are intact Psych: euthymic mood, full affect    Recent Labs: 03/05/2015: Pro B Natriuretic peptide (BNP) 211.0 04/19/2015: Magnesium 2.3 06/26/2015: B Natriuretic Peptide 351.0 11/06/2015: ALT 13 01/08/2016: BUN 18; Creatinine, Ser 1.25; Hemoglobin 12.7; Platelets 340; Potassium 3.7; Sodium 140    Lipid Panel Lab Results  Component Value Date   CHOL 123 01/02/2016   HDL 29 (L) 01/02/2016   LDLCALC 64 01/02/2016  TRIG 151 (H) 01/02/2016      Wt Readings from Last 3 Encounters:  01/17/16 192 lb (87.1 kg)  01/15/16 192 lb (87.1 kg)  01/08/16 186 lb (84.4 kg)       ASSESSMENT AND PLAN:  Angina pectoris ([place]) - Plan: EKG 12-Lead, NM Myocar Multi W/Spect W/Wall Motion / EF Discussed her symptoms with her, some typical as well as atypical features She does have a long history of coronary artery disease and stenting, no recent workup for ischemia. Recommended we order a pharmacologic Myoview for recent anginal symptoms. She is unable to treadmill  Chest pain, unspecified chest pain type - Plan: NM Myocar Multi W/Spect W/Wall Motion / EF Several visits to the emergency room at Hospital with similar symptoms Stress test ordered as above  SOB (shortness of breath) - Plan: NM Myocar Multi W/Spect W/Wall Motion / EF Does not appear fluid overloaded, no abdominal bloating concerning for CHF, lungs relatively clear, likely does not have large pleural effusion We'll continue on Lasix daily  Coronary artery disease with unspecified angina pectoris - Plan: NM Myocar Multi W/Spect W/Wall Motion / EF Some chest pain symptoms, plan as above with stress testing   Total encounter time more than 25 minutes  Greater than 50% was spent in counseling and coordination of care with the patient   Disposition:   F/U  6 months   Orders Placed This  Encounter  Procedures  . NM Myocar Multi W/Spect W/Wall Motion / EF  . EKG 12-Lead     Signed, Esmond Plants, M.D., Ph.D. 01/17/2016  [place], [place]

## 2016-01-23 NOTE — Progress Notes (Signed)
* [facility]     Assessment and Plan:  Obstructive sleep apnea. --Continue using cpap every night.  --Discussed that to get the most benefit from cpap, should be used every night, all night.   Elevated right diaphragm.  -The patient has decreased air entry in the right lung base, review of her chest x-ray shows an elevated right diaphragm, possibly with a diaphragmatic paralysis. This may be related to her previous history of chylous pleural effusion. -Explained to her that there is no particular treatment for this, and it is likely a chronic condition that has been there for some time.  Chronic debility and deconditioning.  --I suspect that this is the greatest contributor to her exertional dyspnea.   Chronic bronchitis.  --Currently using symicort inhaler prn, feels that it helps. Will change to albuterol inhaler 2 puffs prn.  Cough.  --Continue tessalon.   Pulmonary sarcoidosis. --Currently appears stable.   Chylous pleural effusion. -Recurrent, due to sarcoidosis, currently stable.   Date: 01/23/2016  MRN# [id number] Pamela Collier December 04, 1940   Pamela Collier is a 75 y.o. old female seen in follow up for chief complaint of  Chief Complaint  Patient presents with  . Follow-up    CPAP nightly 4-5 hrs; SOB (recent ED visit) prod cough w/clear mucus; chest tightness     HPI:   The patient is a 75 year old female with a history of pulmonary sarcoidosis diagnosed more than 20 years ago by a mediastinal biopsy. 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 She has a history of sleep apnea, and has been maintained on CPAP. At her last visit it was noted that she had significant deconditioning and she was encouraged to continue PT. It was also noted that she was not using PAP very regularly.   She recently saw her cardiologist, Dr. Rockey Situ with chronic chest pain.   She lives at home with her  husband but he is also is poorly functional.  She is using symbicort inhaler prn, she uses it once or twice per week, at last visit we discussed using spiriva daily, but she has not done that. She does not use a rescue inhaler that we discussed at last visit.   She has been using her cpap every night, and feels that she is doing well with it about 4 or 5 hours per night, not the whole night.   Review of CXR from 01/08/16; elevated right diaphragm.    chest x-ray 09/12/15, bibasilar atelectasis, minimal pleural effusions, scattered interstitial scarring.  10/2012 CT chest >> small to moderate R lung effusion>> chylothorax, 100% lymphs 10/2013 thoracentesis> cytology negative, WBC 1471, > 90% "small lymphs" 01/2014 CT chest> near complete resolution of pleural effusion, stable lymphadenopathy 08/2014 CT chest [place] ([facility]): small right sided effusion decreased in size, stable mediastinal lymphadenopathy 1.0cm largest, 42m    Medication:   Outpatient Encounter Prescriptions as of 01/24/2016  Medication Sig  . acetaminophen (TYLENOL) 500 MG tablet Take 500 mg by mouth every 6 (six) hours as needed for mild pain or headache.  . albuterol (PROAIR HFA) 108 (90 Base) MCG/ACT inhaler Inhale 2 puffs into the lungs every 6 (six) hours as needed for wheezing or shortness of breath.  [name] albuterol (PROVENTIL) (2.5 MG/3ML) 0.083% nebulizer solution Take 3 mLs (2.5 mg total) by nebulization every 6 (six) hours as needed for wheezing or shortness of breath.  [name] alendronate (FOSAMAX)  70 MG tablet Take 70 mg by mouth once a week. Pt takes on Sunday.   Take with a full glass of water on an empty stomach.  . ALPRAZolam (XANAX) 0.5 MG tablet Take 0.5 mg by mouth daily.  [name] aspirin EC 81 MG tablet Take 81 mg by mouth daily.  . B-D UF III MINI PEN NEEDLES 31G X 5 MM MISC   . benzonatate (TESSALON) 100 MG capsule Take 200 mg by mouth 3 (three) times daily as needed for cough. Reported on  11/09/2015  . BRILINTA 90 MG TABS tablet Take 1 tablet (90 mg total) by mouth 2 (two) times daily.  . budesonide-formoterol (SYMBICORT) 160-4.5 MCG/ACT inhaler Inhale 2 puffs into the lungs 2 (two) times daily.  [name] docusate sodium (COLACE) 100 MG capsule Take 100 mg by mouth 2 (two) times daily as needed for mild constipation. Reported on 11/09/2015  . fluticasone (FLONASE) 50 MCG/ACT nasal spray Place 2 sprays into both nostrils daily as needed for rhinitis.  . furosemide (LASIX) 20 MG tablet Take 1 tablet (20 mg total) by mouth daily.  [name] gabapentin (NEURONTIN) 100 MG capsule Take 1 capsule (100 mg total) by mouth at bedtime.  . hydrOXYzine (ATARAX/VISTARIL) 10 MG tablet Take 1 tablet (10 mg total) by mouth 3 (three) times daily as needed.  . Insulin Lispro Prot & Lispro (HUMALOG MIX 75/25 KWIKPEN) (75-25) 100 UNIT/ML Kwikpen Inject 45 to 60 units twice daily before morning and evening meals (Patient taking differently: Inject 44-60 Units into the skin. Inject 45 to 60 units twice daily before morning and evening meals)  . Ipratropium-Albuterol (COMBIVENT RESPIMAT) 20-100 MCG/ACT AERS respimat Inhale 1 puff into the lungs 3 (three) times daily as needed for wheezing or shortness of breath. Reported on 10/25/2015  . isosorbide mononitrate (IMDUR) 30 MG 24 hr tablet TAKE 1 TABLET DAILY  . losartan (COZAAR) 100 MG tablet TAKE 1 TABLET DAILY  . metoprolol tartrate (LOPRESSOR) 25 MG tablet Take 0.5 tablets (12.5 mg total) by mouth 2 (two) times daily.  . nitroGLYCERIN (NITROSTAT) 0.4 MG SL tablet Place 1 tablet (0.4 mg total) under the tongue every 5 (five) minutes as needed for chest pain.  [name] ondansetron (ZOFRAN) 8 MG tablet Take 1 tablet (8 mg total) by mouth every 8 (eight) hours as needed for nausea or vomiting.  [name] PARoxetine (PAXIL) 20 MG tablet TAKE ONE TABLET BY MOUTH ONCE DAILY  . polyethylene glycol (MIRALAX / GLYCOLAX) packet Take 17 g by mouth daily as needed for mild constipation. Reported on  10/25/2015  . pregabalin (LYRICA) 100 MG capsule Take 1 capsule (100 mg total) by mouth 3 (three) times daily.  . rosuvastatin (CRESTOR) 20 MG tablet Take 20 mg by mouth at bedtime.  . traMADol (ULTRAM) 50 MG tablet Take 50 mg by mouth every 6 (six) hours as needed for moderate pain.  . Vitamin D, Ergocalciferol, (DRISDOL) 50000 units CAPS capsule Take 50,000 Units by mouth every 7 (seven) days. Pt takes on Sunday.   Facility-Administered Encounter Medications as of 01/24/2016  Medication  . albuterol (PROVENTIL) (5 MG/ML) 0.5% nebulizer solution 2.5 mg     Allergies:  Contrast media [iodinated diagnostic agents]; Gabapentin; and Sulfa antibiotics  Review of Systems: Gen:  Denies  fever, sweats. HEENT: Denies blurred vision. Cvc:  No dizziness, chest pain or heaviness Resp:   Denies cough or sputum porduction. Gi: Denies swallowing difficulty, stomach pain. constipation, bowel incontinence Gu:  Denies bladder incontinence, burning urine Ext:   No  Joint pain, stiffness. Skin: No skin rash, easy bruising. Endoc:  No polyuria, polydipsia. Psych: No depression, insomnia. Other:  All other systems were reviewed and found to be negative other than what is mentioned in the HPI.   Physical Examination:   VS: BP 128/62 (BP Location: Left Arm, Cuff Size: Normal)   Pulse 63   Wt 188 lb (85.3 kg)   SpO2 98%   BMI 29.44 kg/m   General Appearance: No distress  Neuro:without focal findings,  speech normal,  HEENT: PERRLA, EOM intact. Pulmonary: normal breath sounds, No wheezing. Decreased air entry in right lower lobe.    CardiovascularNormal S1,S2.  No m/r/g.   Abdomen: Benign, Soft, non-tender. Renal:  No costovertebral tenderness  GU:  Not performed at this time. Endoc: No evident thyromegaly, no signs of acromegaly. Skin:   warm, no rash. Extremities: normal, no cyanosis, clubbing.   LABORATORY PANEL:   CBC No results for input(s): WBC, HGB, HCT, PLT in the last 168  hours. ------------------------------------------------------------------------------------------------------------------  Chemistries  No results for input(s): NA, K, CL, CO2, GLUCOSE, BUN, CREATININE, CALCIUM, MG, AST, ALT, ALKPHOS, BILITOT in the last 168 hours.  Invalid input(s): GFRCGP ------------------------------------------------------------------------------------------------------------------  Cardiac Enzymes No results for input(s): TROPONINI in the last 168 hours. ------------------------------------------------------------  RADIOLOGY:   No results found for this or any previous visit. Results for orders placed during the hospital encounter of 09/12/15  DG Chest 2 View   Narrative CLINICAL DATA:  Shortness of breath and chest pain. History of sarcoidosis  EXAM: CHEST  2 VIEW  COMPARISON:  Chest radiograph June 28, 2015 and chest CT July 11, 2015  FINDINGS: There is no edema or consolidation. Heart size and pulmonary vascularity are normal. No adenopathy. Calcified left mediastinal lymph nodes likely is secondary to sarcoidosis. No bone lesions. No pneumothorax.  IMPRESSION: Calcified lymph nodes, a finding likely secondary to prior sarcoidosis. No edema or consolidation.   Electronically Signed   By: Lowella Grip III M.D.   On: 09/12/2015 12:46    ------------------------------------------------------------------------------------------------------------------  Thank  you for allowing [facility] to assist in the care of your patient. Our recommendations are noted above.  Please contact us if we can be of further service.   Marda Stalker, MD.  [facility] Office Number: [phone]  Patricia Pesa, M.D.  Vilinda Boehringer, M.D.  Merton Border, M.D  01/23/2016

## 2016-01-24 ENCOUNTER — Ambulatory Visit (INDEPENDENT_AMBULATORY_CARE_PROVIDER_SITE_OTHER): Payer: Medicare Other | Admitting: Internal Medicine

## 2016-01-24 ENCOUNTER — Encounter: Payer: Self-pay | Admitting: Internal Medicine

## 2016-01-24 VITALS — BP 128/62 | HR 63 | Wt 188.0 lb

## 2016-01-24 DIAGNOSIS — J986 Disorders of diaphragm: Secondary | ICD-10-CM

## 2016-01-24 DIAGNOSIS — I200 Unstable angina: Secondary | ICD-10-CM | POA: Diagnosis not present

## 2016-01-24 DIAGNOSIS — G4733 Obstructive sleep apnea (adult) (pediatric): Secondary | ICD-10-CM | POA: Diagnosis not present

## 2016-01-24 DIAGNOSIS — Z9989 Dependence on other enabling machines and devices: Principal | ICD-10-CM

## 2016-01-24 NOTE — Patient Instructions (Signed)
--  Stop using symbicort, start albuterol (rescue inhaler) that we will prescribe, Take 2 puffs when you feel short winded.

## 2016-01-27 ENCOUNTER — Other Ambulatory Visit: Payer: Self-pay | Admitting: *Deleted

## 2016-01-29 ENCOUNTER — Encounter
Admission: RE | Admit: 2016-01-29 | Discharge: 2016-01-29 | Disposition: A | Discharge status: Home / Self Care | Payer: Medicare Other | Source: Ambulatory Visit | Attending: Cardiovascular Disease | Admitting: Cardiovascular Disease

## 2016-01-29 DIAGNOSIS — R0602 Shortness of breath: Secondary | ICD-10-CM | POA: Diagnosis not present

## 2016-01-29 DIAGNOSIS — I209 Angina pectoris, unspecified: Secondary | ICD-10-CM | POA: Insufficient documentation

## 2016-01-29 DIAGNOSIS — R079 Chest pain, unspecified: Secondary | ICD-10-CM | POA: Diagnosis not present

## 2016-01-29 DIAGNOSIS — I25119 Atherosclerotic heart disease of native coronary artery with unspecified angina pectoris: Secondary | ICD-10-CM | POA: Insufficient documentation

## 2016-01-29 LAB — NM MYOCAR MULTI W/SPECT W/WALL MOTION / EF
CHL CUP NUCLEAR SDS: 2
CHL CUP NUCLEAR SRS: 5
CSEPPHR: 68 {beats}/min
LV dias vol: 82 mL (ref 46–106)
LVSYSVOL: 34 mL
NUC STRESS TID: 1.08
Percent HR: 46 %
Rest HR: 57 {beats}/min
SSS: 6

## 2016-01-29 MED ORDER — TECHNETIUM TC 99M TETROFOSMIN IV KIT
31.2980 | PACK | Freq: Once | INTRAVENOUS | Status: AC | PRN
  Administered 2016-01-29: 31.298 via INTRAVENOUS

## 2016-01-29 MED ORDER — REGADENOSON 0.4 MG/5ML IV SOLN
0.4000 mg | Freq: Once | INTRAVENOUS | Status: AC
  Administered 2016-01-29: 0.4 mg via INTRAVENOUS

## 2016-01-29 MED ORDER — TECHNETIUM TC 99M TETROFOSMIN IV KIT
13.5000 | PACK | Freq: Once | INTRAVENOUS | Status: AC | PRN
  Administered 2016-01-29: 13.5 via INTRAVENOUS

## 2016-01-31 DIAGNOSIS — N183 Chronic kidney disease, stage 3 (moderate): Secondary | ICD-10-CM | POA: Diagnosis not present

## 2016-01-31 DIAGNOSIS — I129 Hypertensive chronic kidney disease with stage 1 through stage 4 chronic kidney disease, or unspecified chronic kidney disease: Secondary | ICD-10-CM | POA: Diagnosis not present

## 2016-01-31 DIAGNOSIS — E1122 Type 2 diabetes mellitus with diabetic chronic kidney disease: Secondary | ICD-10-CM | POA: Diagnosis not present

## 2016-01-31 DIAGNOSIS — R809 Proteinuria, unspecified: Secondary | ICD-10-CM | POA: Diagnosis not present

## 2016-02-12 ENCOUNTER — Encounter: Payer: Medicare Other | Attending: Internal Medicine | Admitting: Respiratory Therapy

## 2016-02-12 VITALS — Ht 67.0 in | Wt 193.0 lb

## 2016-02-12 DIAGNOSIS — G4733 Obstructive sleep apnea (adult) (pediatric): Secondary | ICD-10-CM | POA: Diagnosis not present

## 2016-02-12 NOTE — Progress Notes (Signed)
Pulmonary Individual Treatment Plan  Patient Details  [name] MRN: [id number] Date of Birth: [birth year] Referring Provider:   Flowsheet Row Pulmonary Rehab from 02/12/2016 in [facility] Cardiac and Pulmonary Rehab  Referring Provider  Tullo      Initial Encounter Date:  Flowsheet Row Pulmonary Rehab from 02/12/2016 in [facility] Cardiac and Pulmonary Rehab  Date  02/12/16  Referring Provider  Derrel Nip      Visit Diagnosis: Obstructive sleep apnea syndrome  Patient's Home Medications on Admission:  Current Outpatient Prescriptions:    acetaminophen (TYLENOL) 500 MG tablet, Take 500 mg by mouth every 6 (six) hours as needed for mild pain or headache., Disp: , Rfl:    albuterol (PROAIR HFA) 108 (90 Base) MCG/ACT inhaler, Inhale 2 puffs into the lungs every 6 (six) hours as needed for wheezing or shortness of breath., Disp: 1 Inhaler, Rfl: 2   albuterol (PROVENTIL) (2.5 MG/3ML) 0.083% nebulizer solution, Take 3 mLs (2.5 mg total) by nebulization every 6 (six) hours as needed for wheezing or shortness of breath., Disp: 150 mL, Rfl: 1   alendronate (FOSAMAX) 70 MG tablet, Take 70 mg by mouth once a week. Pt takes on Sunday.   Take with a full glass of water on an empty stomach., Disp: , Rfl:    ALPRAZolam (XANAX) 0.5 MG tablet, Take 0.5 mg by mouth daily., Disp: , Rfl:    aspirin EC 81 MG tablet, Take 81 mg by mouth daily., Disp: , Rfl:    B-D UF III MINI PEN NEEDLES 31G X 5 MM MISC, , Disp: , Rfl:    benzonatate (TESSALON) 100 MG capsule, Take 200 mg by mouth 3 (three) times daily as needed for cough. Reported on 11/09/2015, Disp: , Rfl:    BRILINTA 90 MG TABS tablet, Take 1 tablet (90 mg total) by mouth 2 (two) times daily., Disp: 180 tablet, Rfl: 2   budesonide-formoterol (SYMBICORT) 160-4.5 MCG/ACT inhaler, Inhale 2 puffs into the lungs 2 (two) times daily., Disp: 1 Inhaler, Rfl: 0   docusate sodium (COLACE) 100 MG capsule, Take 100 mg by mouth 2 (two) times daily as needed for  mild constipation. Reported on 11/09/2015, Disp: , Rfl:    fluticasone (FLONASE) 50 MCG/ACT nasal spray, Place 2 sprays into both nostrils daily as needed for rhinitis., Disp: , Rfl:    furosemide (LASIX) 20 MG tablet, Take 1 tablet (20 mg total) by mouth daily., Disp: 90 tablet, Rfl: 1   gabapentin (NEURONTIN) 100 MG capsule, Take 1 capsule (100 mg total) by mouth at bedtime., Disp: 30 capsule, Rfl: 0   hydrOXYzine (ATARAX/VISTARIL) 10 MG tablet, Take 1 tablet (10 mg total) by mouth 3 (three) times daily as needed., Disp: 30 tablet, Rfl: 0   Insulin Lispro Prot & Lispro (HUMALOG MIX 75/25 KWIKPEN) (75-25) 100 UNIT/ML Kwikpen, Inject 45 to 60 units twice daily before morning and evening meals (Patient taking differently: Inject 44-60 Units into the skin. Inject 45 to 60 units twice daily before morning and evening meals), Disp: 112 mL, Rfl: 3   Ipratropium-Albuterol (COMBIVENT RESPIMAT) 20-100 MCG/ACT AERS respimat, Inhale 1 puff into the lungs 3 (three) times daily as needed for wheezing or shortness of breath. Reported on 10/25/2015, Disp: , Rfl:    isosorbide mononitrate (IMDUR) 30 MG 24 hr tablet, TAKE 1 TABLET DAILY, Disp: 90 tablet, Rfl: 1   losartan (COZAAR) 100 MG tablet, TAKE 1 TABLET DAILY, Disp: 90 tablet, Rfl: 1   metoprolol tartrate (LOPRESSOR) 25 MG tablet, Take 0.5  tablets (12.5 mg total) by mouth 2 (two) times daily., Disp: , Rfl:    nitroGLYCERIN (NITROSTAT) 0.4 MG SL tablet, Place 1 tablet (0.4 mg total) under the tongue every 5 (five) minutes as needed for chest pain., Disp: 25 tablet, Rfl: 3   ondansetron (ZOFRAN) 8 MG tablet, Take 1 tablet (8 mg total) by mouth every 8 (eight) hours as needed for nausea or vomiting., Disp: 15 tablet, Rfl: 0   PARoxetine (PAXIL) 20 MG tablet, TAKE ONE TABLET BY MOUTH ONCE DAILY, Disp: 30 tablet, Rfl: 1   polyethylene glycol (MIRALAX / GLYCOLAX) packet, Take 17 g by mouth daily as needed for mild constipation. Reported on 10/25/2015, Disp: ,  Rfl:    pregabalin (LYRICA) 100 MG capsule, Take 1 capsule (100 mg total) by mouth 3 (three) times daily., Disp: 270 capsule, Rfl: 2   rosuvastatin (CRESTOR) 20 MG tablet, Take 20 mg by mouth at bedtime., Disp: , Rfl:    traMADol (ULTRAM) 50 MG tablet, Take 50 mg by mouth every 6 (six) hours as needed for moderate pain., Disp: , Rfl:    Vitamin D, Ergocalciferol, (DRISDOL) 50000 units CAPS capsule, Take 50,000 Units by mouth every 7 (seven) days. Pt takes on Sunday., Disp: , Rfl:  No current facility-administered medications for this visit.   Facility-Administered Medications Ordered in Other Visits:    albuterol (PROVENTIL) (5 MG/ML) 0.5% nebulizer solution 2.5 mg, 2.5 mg, Nebulization, Once, [name], MD  Past Medical History: Past Medical History:  Diagnosis Date   Acute respiratory failure ([place]) 11/21/2014   Arthritis 08/05/2013   Atherosclerotic peripheral vascular disease with gangrene [facility]) august 2012   Cardiomyopathy, ischemic    a. EF 35 to 40% by echo in 2013 b. EF improved to 50-55% by echo in 04/2015.   Cerebral infarct ([place]) 08/17/2013   Chronic diastolic CHF (congestive heart failure) (HCC)    a. EF 50-55% by echo in 04/2015   COPD (chronic obstructive pulmonary disease) (HCC)    Coronary artery disease, occlusive    a. Previous PCI to the LAD, LCx, and RCA in 2010, 2011, 2013, and 2016. All performed in [place].   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   Pleural effusion 10/25/2012   10/2012 CT chest >> small to moderate R lung effusion>> chylothorax, 100% lymphs 10/2013 thoracentesis>  cytology negative, WBC 1471, > 90% "small lymphs" 01/2014 CT chest> near complete resolution of pleural effusion, stable lymphadenopathy 08/2014 CT chest [place] ([facility]): small right sided effusion decreased in size, stable mediastinal lymphadenopathy 1.0cm largest, 41m   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   Diagnosed over 20 years ago in [place] with a mediastinal biopsy 03/2013 Full PFT [facility] > UNACCEPTABLE AND NOT REPRODUCIBLE DATA> Ratio 71% FEV 1 1.02 L (55% pred), FVC 1.31 L (49% pred) could not do lung volumes or DLCO    Renal insufficiency    Sarcoidosis ([place])     Tobacco Use: History  Smoking Status   Never Smoker  Smokeless Tobacco   Never Used    Labs: Recent Review Flowsheet Data  Labs for ITP Cardiac and Pulmonary Rehab Latest Ref Rng & Units 06/26/2015 09/26/2015 11/06/2015 12/13/2015 01/02/2016   Cholestrol 0 - 200 mg/dL - - 135 - 123   LDLCALC 0 - 99 mg/dL - - 74 - 64   LDLDIRECT mg/dL - 73.0 - - -   HDL >40 mg/dL - - 31.90(L) - 29(L)   Trlycerides <150 mg/dL - - 143.0 - 151(H)   Hemoglobin A1c - - 9.6(H) - 7.7 -   HCO3 21.0 - 28.0 mEq/L 25.8 - - - -       ADL UCSD:     Pulmonary Assessment Scores    Row Name 02/12/16 1239         ADL UCSD   ADL Phase Entry     SOB Score total 44     Rest 0     Walk 3     Stairs 4     Bath 0     Dress 0     Shop 4        Pulmonary Function Assessment:   Exercise Target Goals: Date: 02/12/16  Exercise Program Goal: Individual exercise prescription set with THRR, safety & activity barriers. Participant demonstrates ability to understand and report RPE using BORG scale, to self-measure pulse accurately, and to acknowledge the importance of the exercise prescription.  Exercise Prescription Goal: Starting with aerobic activity 30 plus minutes a day, 3 days per week for initial exercise prescription. Provide home exercise prescription and guidelines that participant acknowledges  understanding prior to discharge.  Activity Barriers & Risk Stratification:     Activity Barriers & Cardiac Risk Stratification - 02/12/16 1237      Activity Barriers & Cardiac Risk Stratification   Activity Barriers Shortness of Breath;Deconditioning   Cardiac Risk Stratification Moderate      6 Minute Walk:     6 Minute Walk    Row Name 02/12/16 1152         6 Minute Walk   Distance 885 feet     Walk Time 5.49 minutes     # of Rest Breaks 2     MPH 1.83     METS 2.38     RPE 13     Perceived Dyspnea  5     VO2 Peak 5.66     Symptoms Yes (comment)     Comments Shortness of breath - stopped to rest then continued     Resting HR 60 bpm     Resting BP 136/62     Max Ex. HR 77 bpm     Max Ex. BP 148/58       Interval HR   Baseline HR 60     1 Minute HR 64     2 Minute HR 66     3 Minute HR 67     4 Minute HR 75     5 Minute HR 76     6 Minute HR 77     2 Minute Post HR 73     Interval Heart Rate? Yes       Interval Oxygen   Interval Oxygen? Yes     Baseline Oxygen Saturation % 98 %     1 Minute Oxygen Saturation % 95 %     2 Minute Oxygen Saturation % 93 %     3 Minute Oxygen Saturation % 91 %     4 Minute Oxygen Saturation % 95 %     5 Minute Oxygen Saturation %  94 %     6 Minute Oxygen Saturation % 96 %     2 Minute Post Oxygen Saturation % 99 %        Initial Exercise Prescription:     Initial Exercise Prescription - 02/12/16 1200      Date of Initial Exercise RX and Referring Provider   Date 02/12/16   Referring Provider Tullo     Treadmill   MPH 1.5   Grade 0   Minutes 15  5/5/5   METs 2.15     NuStep   Level 2   Minutes 15   METs 2     Biostep-RELP   Level 2   Minutes 15   METs 2     Prescription Details   Frequency (times per week) 3   Duration Progress to 45 minutes of aerobic exercise without signs/symptoms of physical distress     Intensity   THRR 40-80% of Max Heartrate 94-128   Ratings of Perceived Exertion 11-13    Perceived Dyspnea 0-4     Progression   Progression Continue to progress workloads to maintain intensity without signs/symptoms of physical distress.     Resistance Training   Training Prescription Yes   Weight 2   Reps 10-12      Perform Capillary Blood Glucose checks as needed.  Exercise Prescription Changes:   Exercise Comments:   Discharge Exercise Prescription (Final Exercise Prescription Changes):    Nutrition:  Target Goals: Understanding of nutrition guidelines, daily intake of sodium 1500mg , cholesterol 200mg , calories 30% from fat and 7% or less from saturated fats, daily to have 5 or more servings of fruits and vegetables.  Biometrics:     Pre Biometrics - 02/12/16 1152      Pre Biometrics   Height 5\' 7"  (1.702 m)   Weight 193 lb (87.5 kg)   Waist Circumference 45 inches   Hip Circumference 39 inches   Waist to Hip Ratio 1.15 %   BMI (Calculated) 30.3       Nutrition Therapy Plan and Nutrition Goals:   Nutrition Discharge: Rate Your Plate Scores:   Psychosocial: Target Goals: Acknowledge presence or absence of depression, maximize coping skills, provide positive support system. Participant is able to verbalize types and ability to use techniques and skills needed for reducing stress and depression.  Initial Review & Psychosocial Screening:     Initial Psych Review & Screening - 02/12/16 1248      Family Dynamics   Comments Ms Maroney has very little support. Her children live in [place] and her husband has medical problems himself. She enjoys socialization, being with her friens, and will enjpy being in the exercise group.     Barriers   Psychosocial barriers to participate in program The patient should benefit from training in stress management and relaxation.     Screening Interventions   Interventions Encouraged to exercise;Program counselor consult      Quality of Life Scores:     Quality of Life - 02/12/16 1250       Quality of Life Scores   Health/Function Pre 21 %   Socioeconomic Pre 21 %   Psych/Spiritual Pre 29.57 %   Family Pre 21 %   GLOBAL Pre 22.94 %      PHQ-9: Recent Review Flowsheet Data    Depression screen [facility] 2/9 02/12/2016 12/03/2015 10/10/2015 10/30/2014 10/30/2014   Decreased Interest 1 0 1 0 0   Down, Depressed, Hopeless 2 0 0 0 0  PHQ - 2 Score 3 0 1 0 0   Altered sleeping 0 - - - -   Tired, decreased energy 2 - - - -   Change in appetite 1 - - - -   Feeling bad or failure about yourself  0 - - - -   Trouble concentrating 0 - - - -   Moving slowly or fidgety/restless 1 - - - -   Suicidal thoughts 0 - - - -   PHQ-9 Score 7 - - - -   Difficult doing work/chores Somewhat difficult - - - -      Psychosocial Evaluation and Intervention:   Psychosocial Re-Evaluation:  Education: Education Goals: Education classes will be provided on a weekly basis, covering required topics. Participant will state understanding/return demonstration of topics presented.  Learning Barriers/Preferences:     Learning Barriers/Preferences - 02/12/16 1238      Learning Barriers/Preferences   Learning Barriers Reading   Learning Preferences None      Education Topics: Initial Evaluation Education: - Verbal, written and demonstration of respiratory meds, RPE/PD scales, oximetry and breathing techniques. Instruction on use of nebulizers and MDIs: cleaning and proper use, rinsing mouth with steroid doses and importance of monitoring MDI activations. Flowsheet Row Pulmonary Rehab from 02/12/2016 in [facility] Cardiac and Pulmonary Rehab  Date  02/12/16  Educator  LB  Instruction Review Code  2- meets goals/outcomes      General Nutrition Guidelines/Fats and Fiber: -Group instruction provided by verbal, written material, models and posters to present the general guidelines for heart healthy nutrition. Gives an explanation and review of dietary fats and fiber.   Controlling Sodium/Reading Food  Labels: -Group verbal and written material supporting the discussion of sodium use in heart healthy nutrition. Review and explanation with models, verbal and written materials for utilization of the food label.   Exercise Physiology & Risk Factors: - Group verbal and written instruction with models to review the exercise physiology of the cardiovascular system and associated critical values. Details cardiovascular disease risk factors and the goals associated with each risk factor.   Aerobic Exercise & Resistance Training: - Gives group verbal and written discussion on the health impact of inactivity. On the components of aerobic and resistive training programs and the benefits of this training and how to safely progress through these programs.   Flexibility, Balance, General Exercise Guidelines: - Provides group verbal and written instruction on the benefits of flexibility and balance training programs. Provides general exercise guidelines with specific guidelines to those with heart or lung disease. Demonstration and skill practice provided.   Stress Management: - Provides group verbal and written instruction about the health risks of elevated stress, cause of high stress, and healthy ways to reduce stress.   Depression: - Provides group verbal and written instruction on the correlation between heart/lung disease and depressed mood, treatment options, and the stigmas associated with seeking treatment.   Exercise & Equipment Safety: - Individual verbal instruction and demonstration of equipment use and safety with use of the equipment.   Infection Prevention: - Provides verbal and written material to individual with discussion of infection control including proper hand washing and proper equipment cleaning during exercise session. Flowsheet Row Pulmonary Rehab from 02/12/2016 in [facility] Cardiac and Pulmonary Rehab  Date  02/12/16  Educator  LB  Instruction Review Code  2- meets  goals/outcomes      Falls Prevention: - Provides verbal and written material to individual with discussion of falls prevention and safety.  Flowsheet Row Pulmonary [facility] Pulmonary Re[facility]  Date  02/12/16  Educator  LB  Instruction Review Code  2- meets goals/outcomes      Diabetes: - Individual verbal and written instruction to review signs/symptoms of diabetes, desired ranges of glucose level fasting, after meals and with exercise. Advice that pre and post exercise glucose checks will be done for 3 sessions at entry of program.   Chronic Lung Diseases: - Group verbal and written instruction to review new updates, new respiratory medications, new advancements in procedures and treatments. Provide informative websites and "800" numbers of self-education.   Lung Procedures: - Group verbal and written instruction to describe testing methods done to diagnose lung disease. Review the outcome of test results. Describe the treatment choices: Pulmonary Function Tests, ABGs and oximetry.   Energy Conservation: - Provide group verbal and written instruction for methods to conserve energy, plan and organize activities. Instruct on pacing techniques, use of adaptive equipment and posture/positioning to relieve shortness of breath.   Triggers: - Group verbal and written instruction to review types of environmental controls: home humidity, furnaces, filters, dust mite/pet prevention, HEPA vacuums. To discuss weather changes, air quality and the benefits of nasal washing.   Exacerbations: - Group verbal and written instruction to provide: warning signs, infection symptoms, calling MD promptly, preventive modes, and value of vaccinations. Review: effective airway clearance, coughing and/or vibration techniques. Create an Sports administrator.   Oxygen: - Individual and group verbal and written instruction on oxygen therapy. Includes supplement oxygen, available portable  oxygen systems, continuous and intermittent flow rates, oxygen safety, concentrators, and Medicare reimbursement for oxygen.   Respiratory Medications: - Group verbal and written instruction to review medications for lung disease. Drug class, frequency, complications, importance of spacers, rinsing mouth after steroid MDI's, and proper cleaning methods for nebulizers. Flowsheet Row Pulmonary Rehab from 02/12/2016 in [facility] Cardiac and Pulmonary Rehab  Date  02/12/16  Educator  LB  Instruction Review Code  2- meets goals/outcomes      AED/CPR: - Group verbal and written instruction with the use of models to demonstrate the basic use of the AED with the basic ABC's of resuscitation.   Breathing Retraining: - Provides individuals verbal and written instruction on purpose, frequency, and proper technique of diaphragmatic breathing and pursed-lipped breathing. Applies individual practice skills. Flowsheet Row Pulmonary Rehab from 02/12/2016 in [facility] Cardiac and Pulmonary Rehab  Date  02/12/16  Educator  LB  Instruction Review Code  2- meets goals/outcomes      Anatomy and Physiology of the Lungs: - Group verbal and written instruction with the use of models to provide basic lung anatomy and physiology related to function, structure and complications of lung disease.   Heart Failure: - Group verbal and written instruction on the basics of heart failure: signs/symptoms, treatments, explanation of ejection fraction, enlarged heart and cardiomyopathy.   Sleep Apnea: - Individual verbal and written instruction to review Obstructive Sleep Apnea. Review of risk factors, methods for diagnosing and types of masks and machines for OSA.   Anxiety: - Provides group, verbal and written instruction on the correlation between heart/lung disease and anxiety, treatment options, and management of anxiety.   Relaxation: - Provides group, verbal and written instruction about the benefits of relaxation  for patients with heart/lung disease. Also provides patients with examples of relaxation techniques.   Knowledge Questionnaire Score:     Knowledge Questionnaire Score - 02/12/16 1238      Knowledge Questionnaire  Score   Pre Score 10/10       Core Components/Risk Factors/Patient Goals at Admission:     Personal Goals and Risk Factors at Admission - 02/12/16 1244      Core Components/Risk Factors/Patient Goals on Admission   Sedentary Yes   Intervention Provide advice, education, support and counseling about physical activity/exercise needs.;Develop an individualized exercise prescription for aerobic and resistive training based on initial evaluation findings, risk stratification, comorbidities and participant's personal goals.   Expected Outcomes Achievement of increased cardiorespiratory fitness and enhanced flexibility, muscular endurance and strength shown through measurements of functional capacity and personal statement of participant.   Increase Strength and Stamina Yes   Intervention Provide advice, education, support and counseling about physical activity/exercise needs.;Develop an individualized exercise prescription for aerobic and resistive training based on initial evaluation findings, risk stratification, comorbidities and participant's personal goals.   Expected Outcomes Achievement of increased cardiorespiratory fitness and enhanced flexibility, muscular endurance and strength shown through measurements of functional capacity and personal statement of participant.   Improve shortness of breath with ADL's Yes   Intervention Provide education, individualized exercise plan and daily activity instruction to help decrease symptoms of SOB with activities of daily living.   Expected Outcomes Short Term: Achieves a reduction of symptoms when performing activities of daily living.   Develop more efficient breathing techniques such as purse lipped breathing and diaphragmatic  breathing; and practicing self-pacing with activity Yes   Intervention Provide education, demonstration and support about specific breathing techniuqes utilized for more efficient breathing. Include techniques such as pursed lipped breathing, diaphragmatic breathing and self-pacing activity.   Expected Outcomes Short Term: Participant will be able to demonstrate and use breathing techniques as needed throughout daily activities.   Increase knowledge of respiratory medications and ability to use respiratory devices properly  Yes   Intervention Provide education and demonstration as needed of appropriate use of medications, inhalers, and oxygen therapy.  Symbicort and Albuterol MDI; spacer given; SVN Albuterol   Expected Outcomes Short Term: Achieves understanding of medications use. Understands that oxygen is a medication prescribed by physician. Demonstrates appropriate use of inhaler and oxygen therapy.   Diabetes Yes   Intervention Provide education about signs/symptoms and action to take for hypo/hyperglycemia.;Provide education about proper nutrition, including hydration, and aerobic/resistive exercise prescription along with prescribed medications to achieve blood glucose in normal ranges: Fasting glucose 65-99 mg/dL   Expected Outcomes Short Term: Participant verbalizes understanding of the signs/symptoms and immediate care of hyper/hypoglycemia, proper foot care and importance of medication, aerobic/resistive exercise and nutrition plan for blood glucose control.;Long Term: Attainment of HbA1C < 7%.   Heart Failure Yes   Intervention Provide a combined exercise and nutrition program that is supplemented with education, support and counseling about heart failure. Directed toward relieving symptoms such as shortness of breath, decreased exercise tolerance, and extremity edema.   Expected Outcomes Improve functional capacity of life;Short term: Attendance in program 2-3 days a week with increased  exercise capacity. Reported lower sodium intake. Reported increased fruit and vegetable intake. Reports medication compliance.;Short term: Daily weights obtained and reported for increase. Utilizing diuretic protocols set by physician.;Long term: Adoption of self-care skills and reduction of barriers for early signs and symptoms recognition and intervention leading to self-care maintenance.   Hypertension Yes   Intervention Provide education on lifestyle modifcations including regular physical activity/exercise, weight management, moderate sodium restriction and increased consumption of fresh fruit, vegetables, and low fat dairy, alcohol moderation, and smoking cessation.;Monitor prescription use compliance.  Expected Outcomes Short Term: Continued assessment and intervention until BP is < 140/76mm HG in hypertensive participants. < 130/50mm HG in hypertensive participants with diabetes, heart failure or chronic kidney disease.;Long Term: Maintenance of blood pressure at goal levels.   Lipids Yes   Intervention Provide education and support for participant on nutrition & aerobic/resistive exercise along with prescribed medications to achieve LDL 70mg , HDL >40mg .   Expected Outcomes Short Term: Participant states understanding of desired cholesterol values and is compliant with medications prescribed. Participant is following exercise prescription and nutrition guidelines.;Long Term: Cholesterol controlled with medications as prescribed, with individualized exercise RX and with personalized nutrition plan. Value goals: LDL < 70mg , HDL > 40 mg.      Core Components/Risk Factors/Patient Goals Review:    Core Components/Risk Factors/Patient Goals at Discharge (Final Review):    ITP Comments:   Comments: Ms Melder plans to start [facility] on 02/18/16 and attend 2-3 days/week.

## 2016-02-12 NOTE — Patient Instructions (Signed)
Patient Instructions  Patient Details  [name] MRN: [id number] Date of Birth: [birth year] Referring Provider:  [name], MD  Below are the personal goals you chose as well as exercise and nutrition goals. Our goal is to help you keep on track towards obtaining and maintaining your goals. We will be discussing your progress on these goals with you throughout the program.  Initial Exercise Prescription:     Initial Exercise Prescription - 02/12/16 1200      Date of Initial Exercise RX and Referring Provider   Date 02/12/16   Referring Provider Tullo     Treadmill   MPH 1.5   Grade 0   Minutes 15  5/5/5   METs 2.15     NuStep   Level 2   Minutes 15   METs 2     Biostep-RELP   Level 2   Minutes 15   METs 2     Prescription Details   Frequency (times per week) 3   Duration Progress to 45 minutes of aerobic exercise without signs/symptoms of physical distress     Intensity   THRR 40-80% of Max Heartrate 94-128   Ratings of Perceived Exertion 11-13   Perceived Dyspnea 0-4     Progression   Progression Continue to progress workloads to maintain intensity without signs/symptoms of physical distress.     Resistance Training   Training Prescription Yes   Weight 2   Reps 10-12      Exercise Goals: Frequency: Be able to perform aerobic exercise three times per week working toward 3-5 days per week.  Intensity: Work with a perceived exertion of 11 (fairly light) - 15 (hard) as tolerated. Follow your new exercise prescription and watch for changes in prescription as you progress with the program. Changes will be reviewed with you when they are made.  Duration: You should be able to do 30 minutes of continuous aerobic exercise in addition to a 5 minute warm-up and a 5 minute cool-down routine.  Nutrition Goals: Your personal nutrition goals will be established when you do your nutrition analysis with the dietician.  The following are nutrition  guidelines to follow: Cholesterol < 200mg /day Sodium < 1500mg /day Fiber: Women over 50 yrs - 21 grams per day  Personal Goals:     Personal Goals and Risk Factors at Admission - 02/12/16 1244      Core Components/Risk Factors/Patient Goals on Admission   Sedentary Yes   Intervention Provide advice, education, support and counseling about physical activity/exercise needs.;Develop an individualized exercise prescription for aerobic and resistive training based on initial evaluation findings, risk stratification, comorbidities and participant's personal goals.   Expected Outcomes Achievement of increased cardiorespiratory fitness and enhanced flexibility, muscular endurance and strength shown through measurements of functional capacity and personal statement of participant.   Increase Strength and Stamina Yes   Intervention Provide advice, education, support and counseling about physical activity/exercise needs.;Develop an individualized exercise prescription for aerobic and resistive training based on initial evaluation findings, risk stratification, comorbidities and participant's personal goals.   Expected Outcomes Achievement of increased cardiorespiratory fitness and enhanced flexibility, muscular endurance and strength shown through measurements of functional capacity and personal statement of participant.   Improve shortness of breath with ADL's Yes   Intervention Provide education, individualized exercise plan and daily activity instruction to help decrease symptoms of SOB with activities of daily living.   Expected Outcomes Short Term: Achieves a reduction of symptoms when performing activities of daily living.  Develop more efficient breathing techniques such as purse lipped breathing and diaphragmatic breathing; and practicing self-pacing with activity Yes   Intervention Provide education, demonstration and support about specific breathing techniuqes utilized for more efficient  breathing. Include techniques such as pursed lipped breathing, diaphragmatic breathing and self-pacing activity.   Expected Outcomes Short Term: Participant will be able to demonstrate and use breathing techniques as needed throughout daily activities.   Increase knowledge of respiratory medications and ability to use respiratory devices properly  Yes   Intervention Provide education and demonstration as needed of appropriate use of medications, inhalers, and oxygen therapy.  Symbicort and Albuterol MDI; spacer given; SVN Albuterol   Expected Outcomes Short Term: Achieves understanding of medications use. Understands that oxygen is a medication prescribed by physician. Demonstrates appropriate use of inhaler and oxygen therapy.   Diabetes Yes   Intervention Provide education about signs/symptoms and action to take for hypo/hyperglycemia.;Provide education about proper nutrition, including hydration, and aerobic/resistive exercise prescription along with prescribed medications to achieve blood glucose in normal ranges: Fasting glucose 65-99 mg/dL   Expected Outcomes Short Term: Participant verbalizes understanding of the signs/symptoms and immediate care of hyper/hypoglycemia, proper foot care and importance of medication, aerobic/resistive exercise and nutrition plan for blood glucose control.;Long Term: Attainment of HbA1C < 7%.   Heart Failure Yes   Intervention Provide a combined exercise and nutrition program that is supplemented with education, support and counseling about heart failure. Directed toward relieving symptoms such as shortness of breath, decreased exercise tolerance, and extremity edema.   Expected Outcomes Improve functional capacity of life;Short term: Attendance in program 2-3 days a week with increased exercise capacity. Reported lower sodium intake. Reported increased fruit and vegetable intake. Reports medication compliance.;Short term: Daily weights obtained and reported for  increase. Utilizing diuretic protocols set by physician.;Long term: Adoption of self-care skills and reduction of barriers for early signs and symptoms recognition and intervention leading to self-care maintenance.   Hypertension Yes   Intervention Provide education on lifestyle modifcations including regular physical activity/exercise, weight management, moderate sodium restriction and increased consumption of fresh fruit, vegetables, and low fat dairy, alcohol moderation, and smoking cessation.;Monitor prescription use compliance.   Expected Outcomes Short Term: Continued assessment and intervention until BP is < 140/50mm HG in hypertensive participants. < 130/22mm HG in hypertensive participants with diabetes, heart failure or chronic kidney disease.;Long Term: Maintenance of blood pressure at goal levels.   Lipids Yes   Intervention Provide education and support for participant on nutrition & aerobic/resistive exercise along with prescribed medications to achieve LDL 70mg , HDL >40mg .   Expected Outcomes Short Term: Participant states understanding of desired cholesterol values and is compliant with medications prescribed. Participant is following exercise prescription and nutrition guidelines.;Long Term: Cholesterol controlled with medications as prescribed, with individualized exercise RX and with personalized nutrition plan. Value goals: LDL < 70mg , HDL > 40 mg.      Tobacco Use Initial Evaluation: History  Smoking Status   Never Smoker  Smokeless Tobacco   Never Used    Copy of goals given to participant.

## 2016-02-18 ENCOUNTER — Encounter: Payer: Self-pay | Admitting: Respiratory Therapy

## 2016-02-18 ENCOUNTER — Telehealth: Payer: Self-pay | Admitting: Respiratory Therapy

## 2016-02-18 ENCOUNTER — Ambulatory Visit: Payer: Medicare Other

## 2016-02-18 DIAGNOSIS — G4733 Obstructive sleep apnea (adult) (pediatric): Secondary | ICD-10-CM

## 2016-02-20 ENCOUNTER — Ambulatory Visit: Payer: Medicare Other

## 2016-02-21 DIAGNOSIS — L97511 Non-pressure chronic ulcer of other part of right foot limited to breakdown of skin: Secondary | ICD-10-CM | POA: Diagnosis not present

## 2016-02-21 DIAGNOSIS — Z89422 Acquired absence of other left toe(s): Secondary | ICD-10-CM | POA: Diagnosis not present

## 2016-02-21 DIAGNOSIS — D2370 Other benign neoplasm of skin of unspecified lower limb, including hip: Secondary | ICD-10-CM | POA: Diagnosis not present

## 2016-02-21 DIAGNOSIS — B351 Tinea unguium: Secondary | ICD-10-CM | POA: Diagnosis not present

## 2016-02-21 DIAGNOSIS — E1042 Type 1 diabetes mellitus with diabetic polyneuropathy: Secondary | ICD-10-CM | POA: Diagnosis not present

## 2016-02-22 ENCOUNTER — Other Ambulatory Visit: Payer: Self-pay | Admitting: Internal Medicine

## 2016-02-22 ENCOUNTER — Ambulatory Visit: Payer: Medicare Other

## 2016-02-25 ENCOUNTER — Ambulatory Visit: Payer: Medicare Other

## 2016-02-26 ENCOUNTER — Telehealth: Payer: Self-pay | Admitting: Respiratory Therapy

## 2016-02-26 ENCOUNTER — Telehealth: Payer: Self-pay | Admitting: Internal Medicine

## 2016-02-26 ENCOUNTER — Encounter: Payer: Self-pay | Admitting: Respiratory Therapy

## 2016-02-26 DIAGNOSIS — G4733 Obstructive sleep apnea (adult) (pediatric): Secondary | ICD-10-CM

## 2016-02-26 MED ORDER — ALBUTEROL SULFATE HFA 108 (90 BASE) MCG/ACT IN AERS: 2.0000 | INHALATION_SPRAY | Freq: Four times a day (QID) | RESPIRATORY_TRACT | 2 refills | Status: DC | PRN

## 2016-02-26 NOTE — Telephone Encounter (Signed)
RX sent to pharmacy. Nothing further needed. 

## 2016-02-26 NOTE — Telephone Encounter (Signed)
Pt calling stating she would like to start using Pro air  Went to order one but they told her she needs a prescription  She would like to use [facility] on [address] Please advise

## 2016-02-27 ENCOUNTER — Ambulatory Visit: Payer: Medicare Other

## 2016-02-29 ENCOUNTER — Ambulatory Visit: Payer: Medicare Other

## 2016-03-03 ENCOUNTER — Ambulatory Visit: Payer: Medicare Other

## 2016-03-05 ENCOUNTER — Ambulatory Visit: Payer: Medicare Other

## 2016-03-07 ENCOUNTER — Ambulatory Visit: Payer: Medicare Other

## 2016-03-08 IMAGING — CT CT HEAD WITHOUT CONTRAST
1 series · 16 of 29 positions shown, 20 images · non-contrast
Comparison: Noncontrast CT scan of the brain May 25, 2011

CLINICAL DATA: Headache

EXAM:
CT HEAD WITHOUT CONTRAST
TECHNIQUE: Contiguous axial images were obtained from the base of the skull
through the vertex without intravenous contrast.

[Series 2: soft tissue · axial · 0.42mm/px · z∈[-143,-13]mm · 16 of 29 slices shown, 20 images]
[im 2/29  brain]
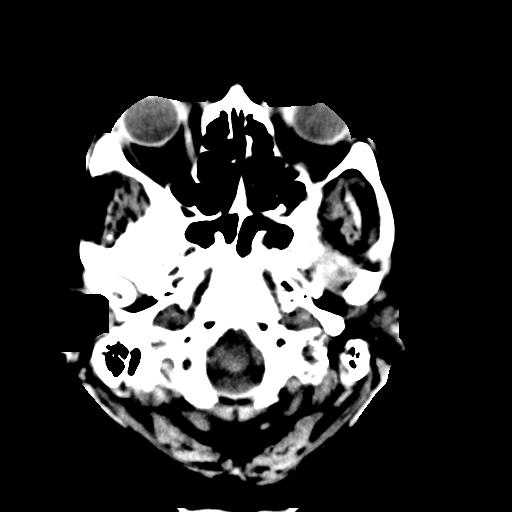
[im 2/29  bone]
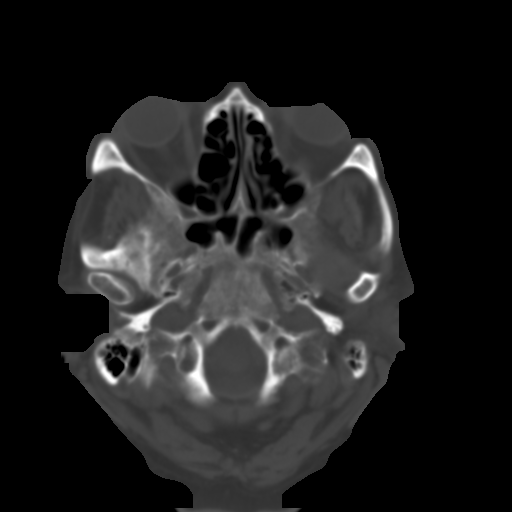
[im 4/29  brain]
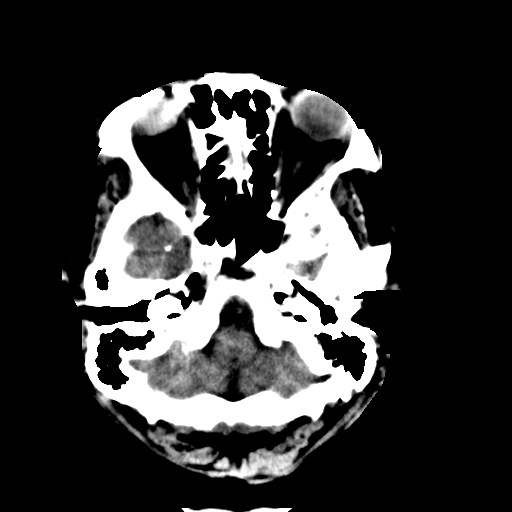
[im 6/29  brain]
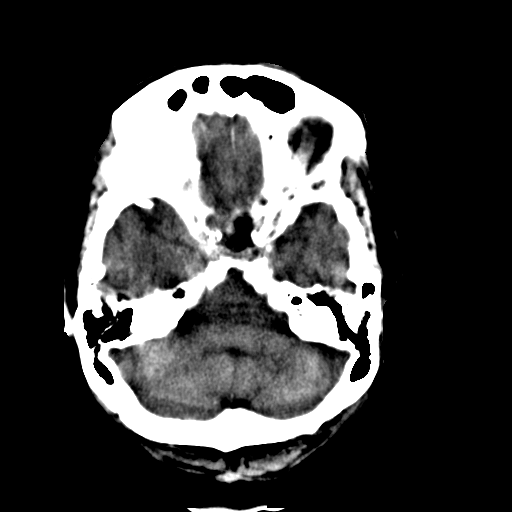
[im 7/29  brain]
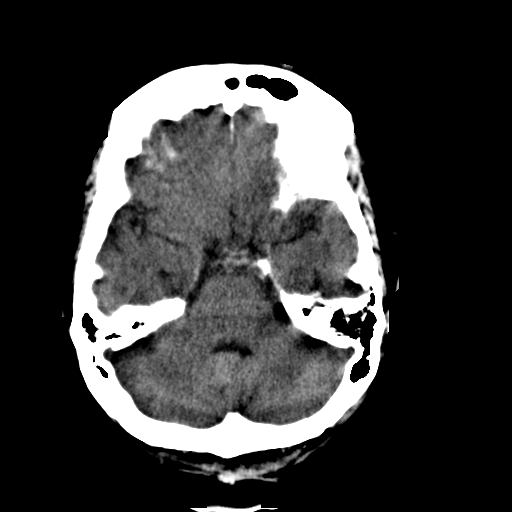
[im 9/29  brain]
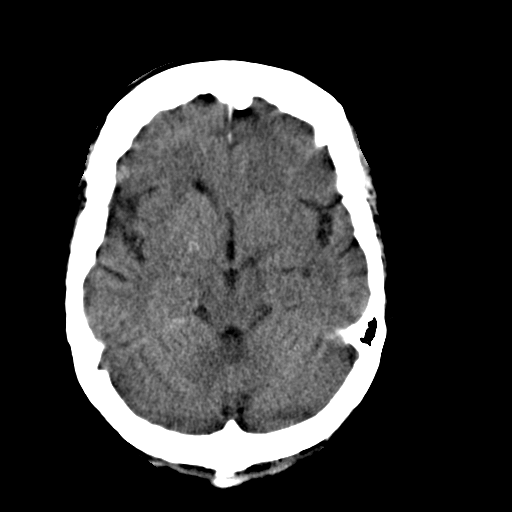
[im 9/29  bone]
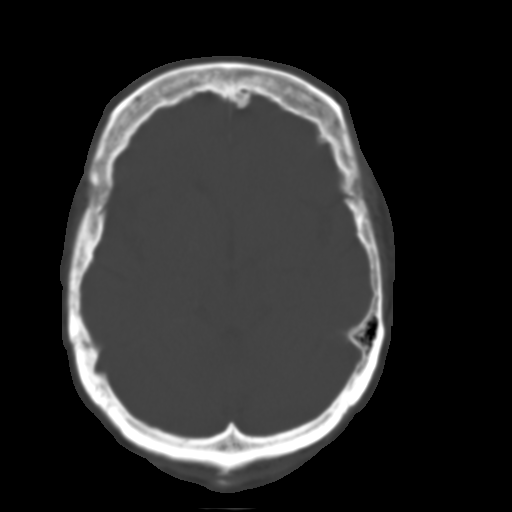
[im 11/29  brain]
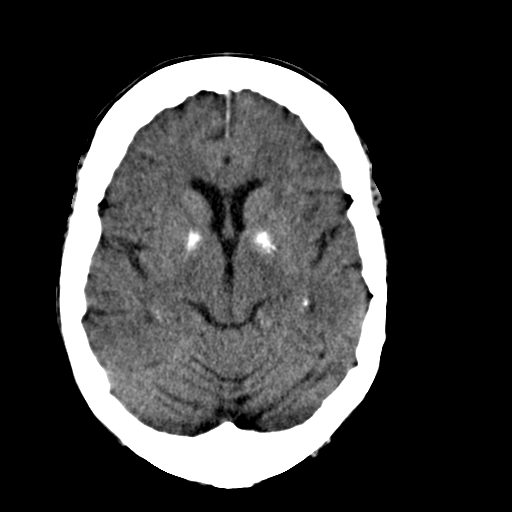
[im 12/29  brain]
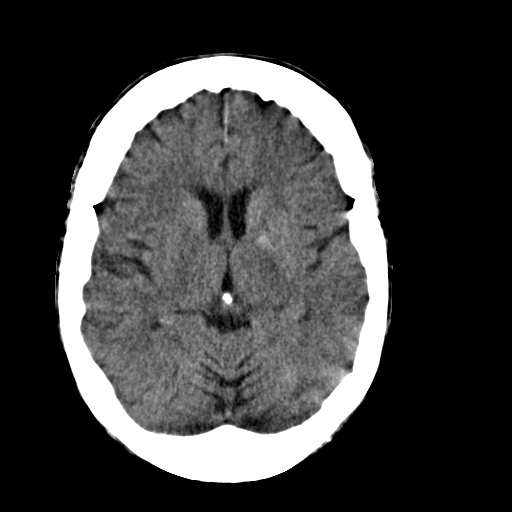
[im 14/29  brain]
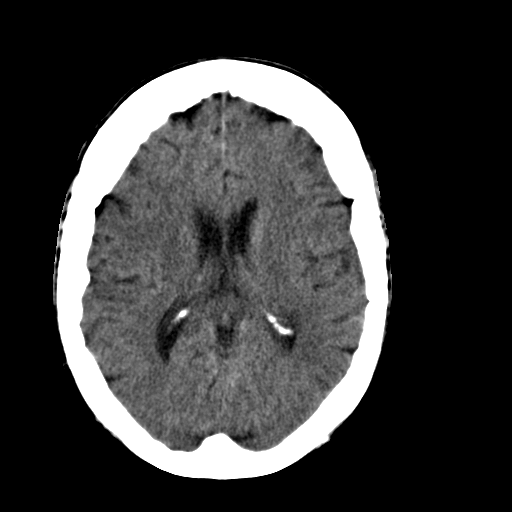
[im 16/29  brain]
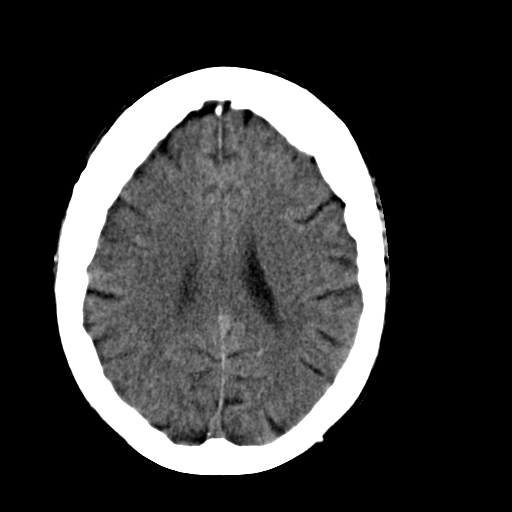
[im 16/29  bone]
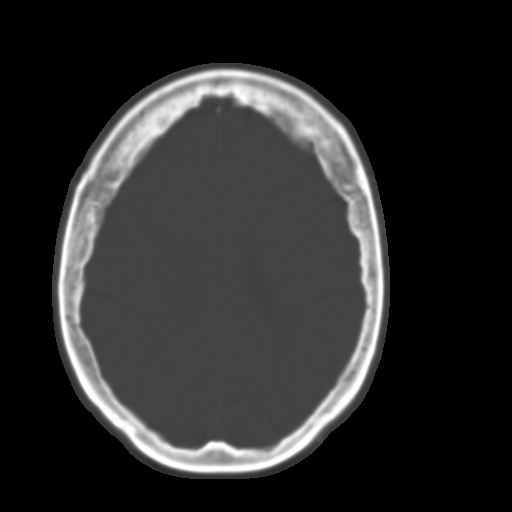
[im 18/29  brain]
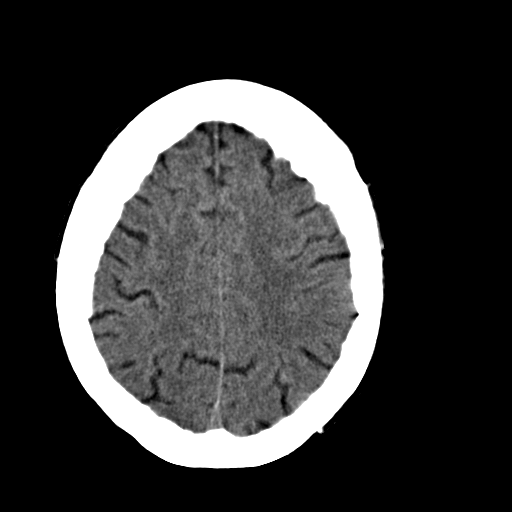
[im 19/29  brain]
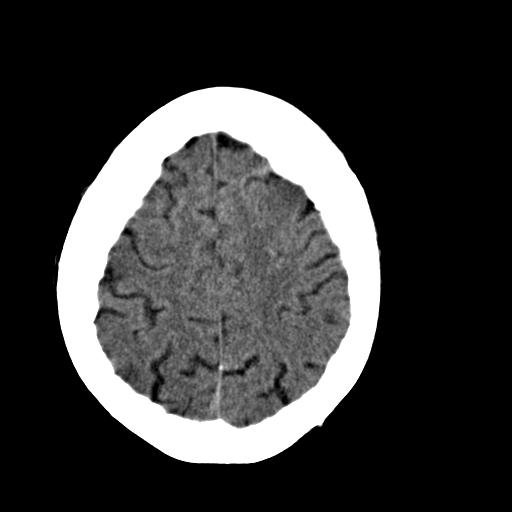
[im 21/29  brain]
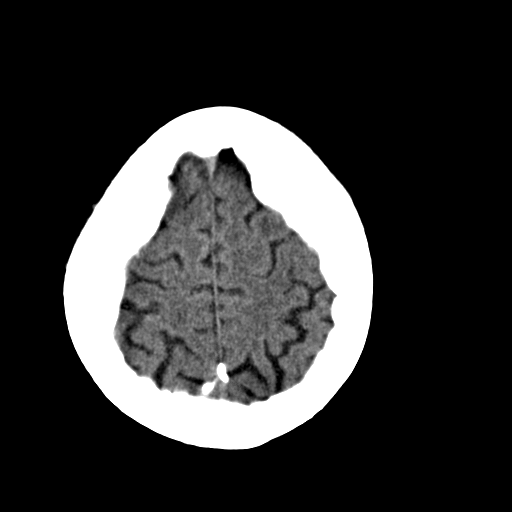
[im 23/29  brain]
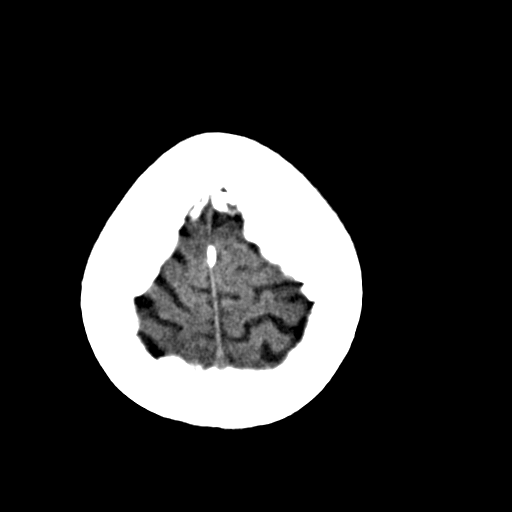
[im 23/29  bone]
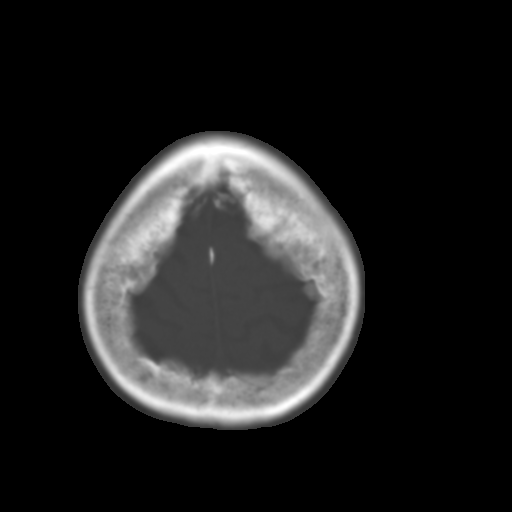
[im 24/29  brain]
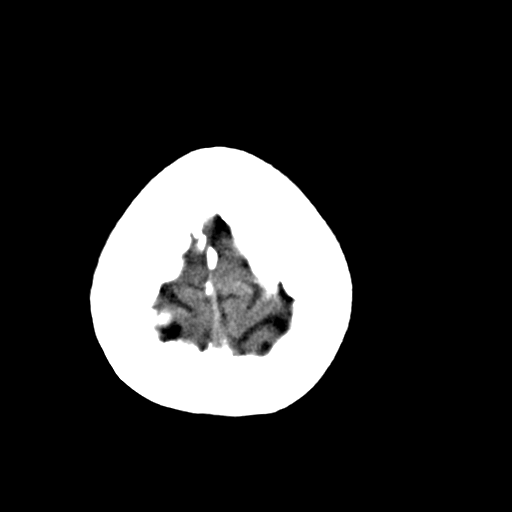
[im 26/29  brain]
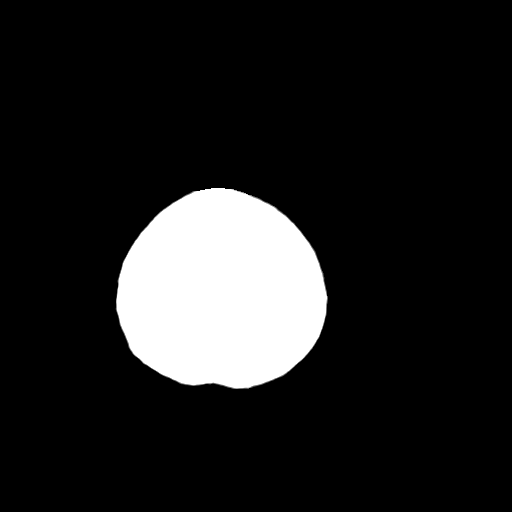
[im 28/29  brain]
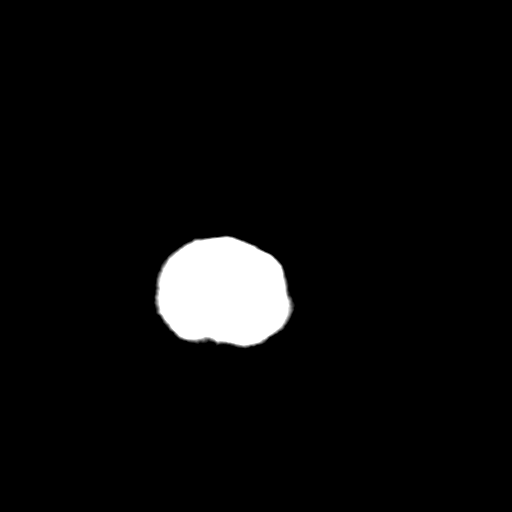

[16 of 29 positions shown; findings below may reference images not displayed]

FINDINGS: The ventricles are normal in size and position. There dense basal
ganglia calcifications bilaterally. There is no intracranial
hemorrhage nor intracranial mass effect nor acute ischemic change.
The cerebellum and brainstem are normal in density.

The observed paranasal sinuses and mastoid air cells are clear.
There is no acute skull fracture.
IMPRESSION: There is no acute intracranial abnormality. Chronic stable basal
ganglia calcification is present.

## 2016-03-08 IMAGING — US US EXTREM LOW VENOUS BILAT
1 series · 14 of 24 positions shown · non-contrast
Comparison: None.

CLINICAL DATA: Bilateral leg pain

EXAM:
BILATERAL LOWER EXTREMITY VENOUS DUPLEX ULTRASOUND
TECHNIQUE: Doppler venous assessment of the bilateral lower extremity deep
venous system was performed, including characterization of spectral
flow, compressibility, and phasicity.

[Series 1: us extrem low venous bilat · 0.10mm/px · 14 of 68 slices shown]
[im 1/68]
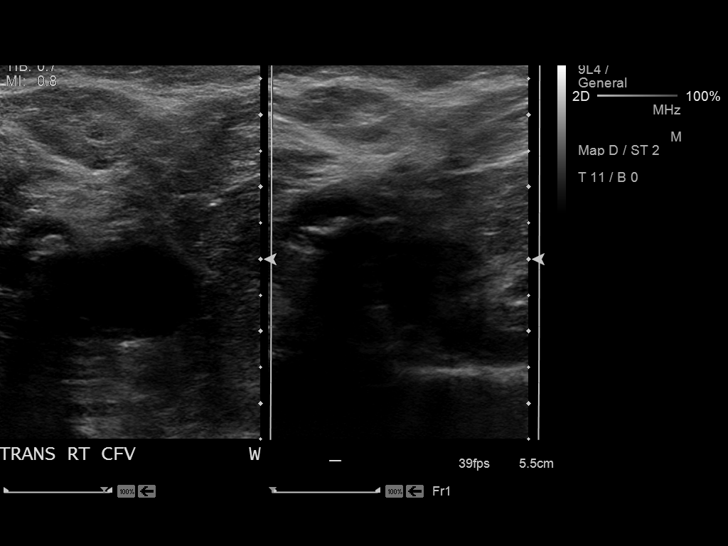
[im 6/68]
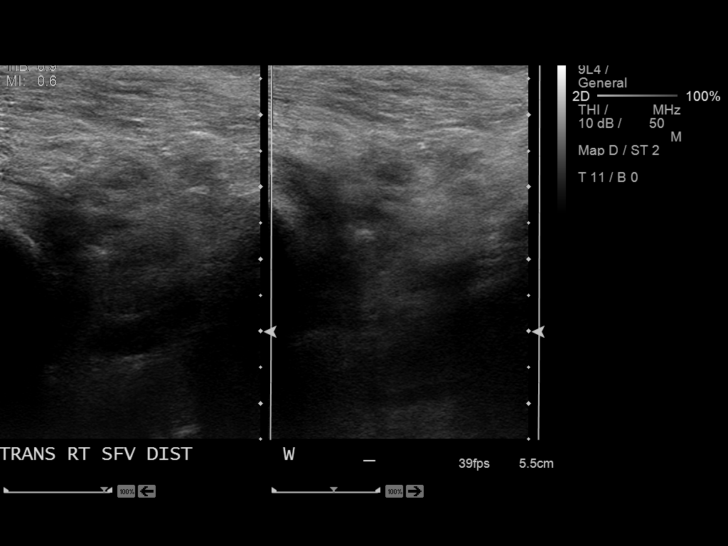
[im 12/68]
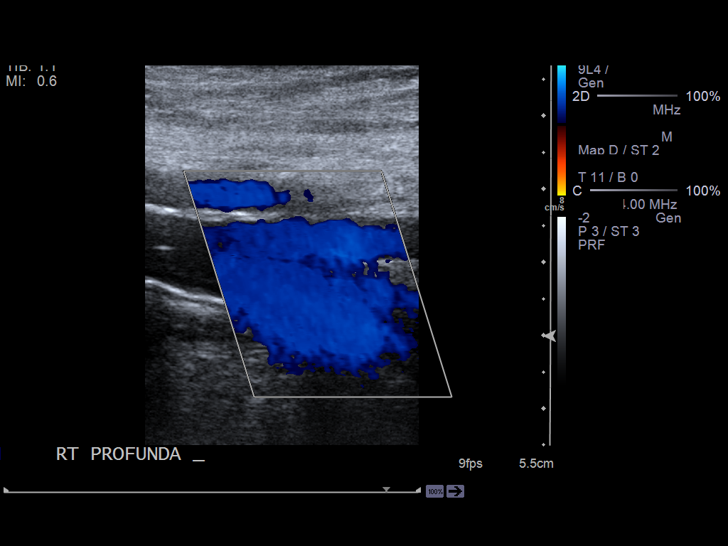
[im 18/68]
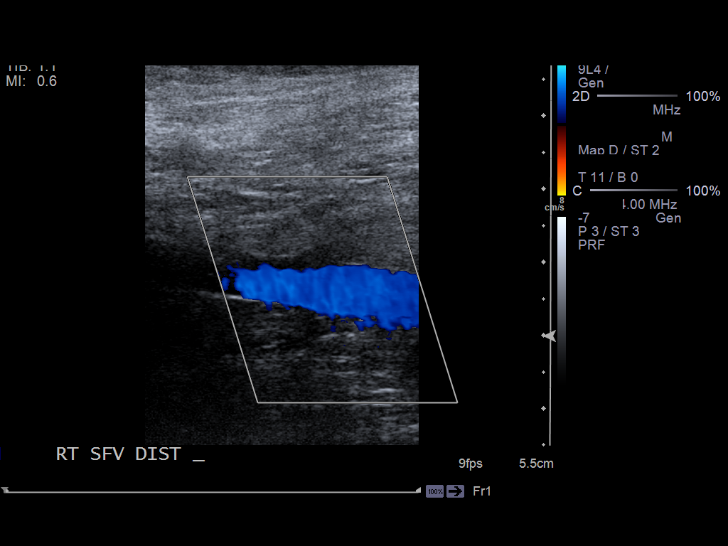
[im 21/68]
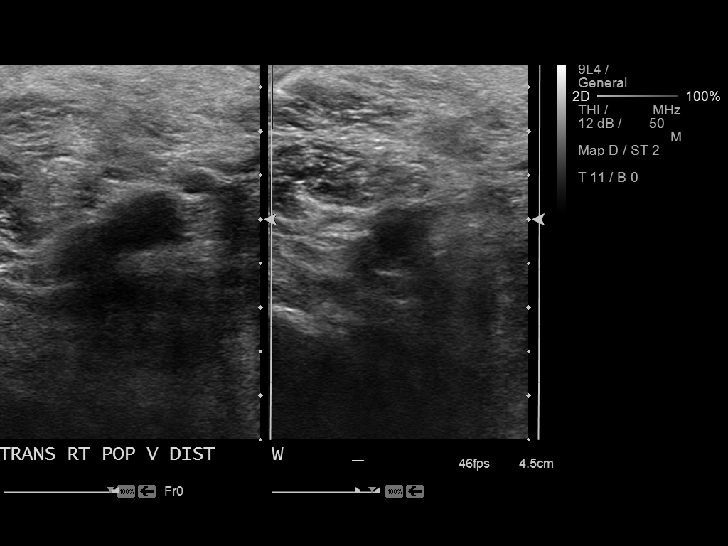
[im 27/68]
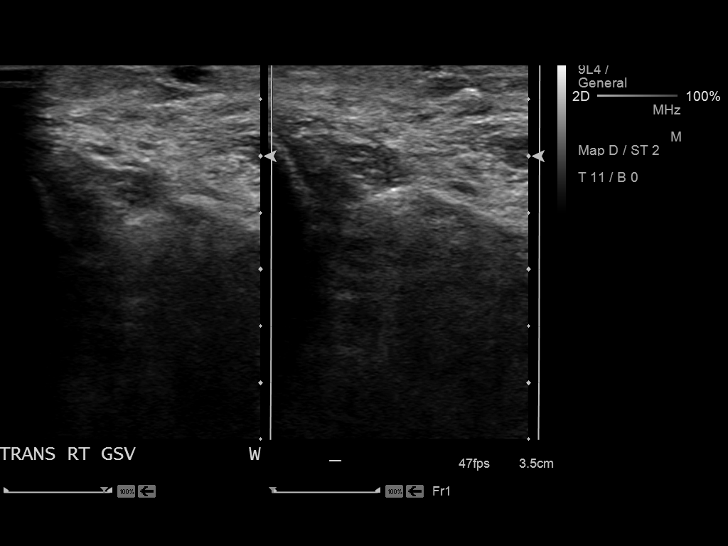
[im 33/68]
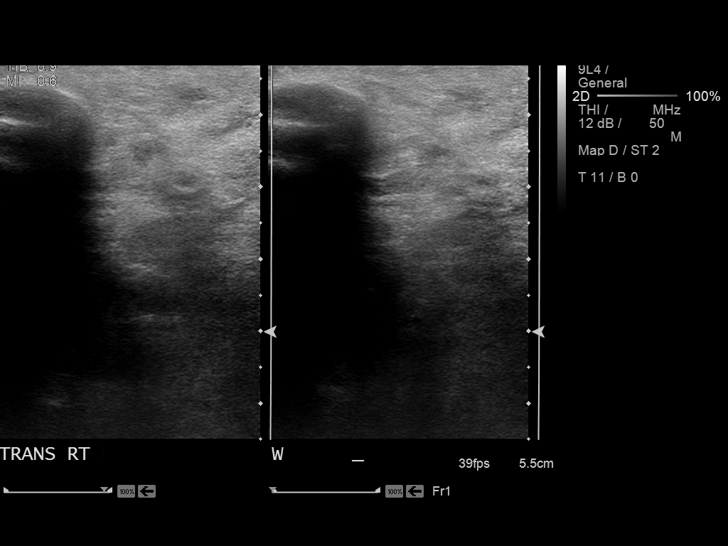
[im 35/68]
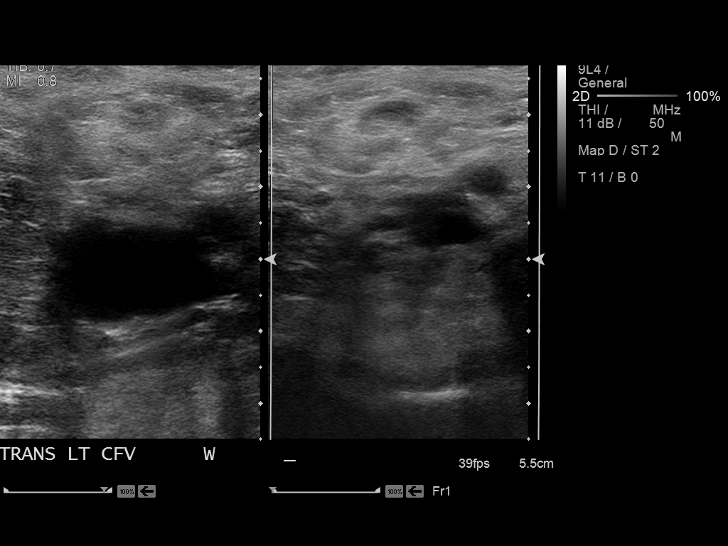
[im 41/68]
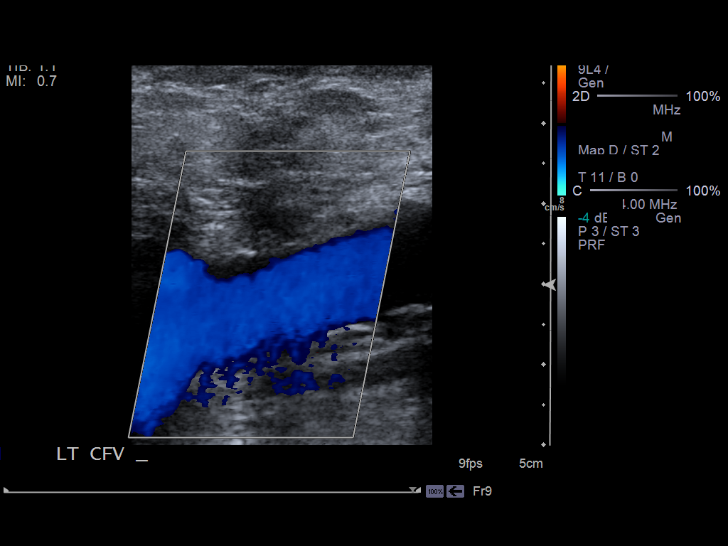
[im 47/68]
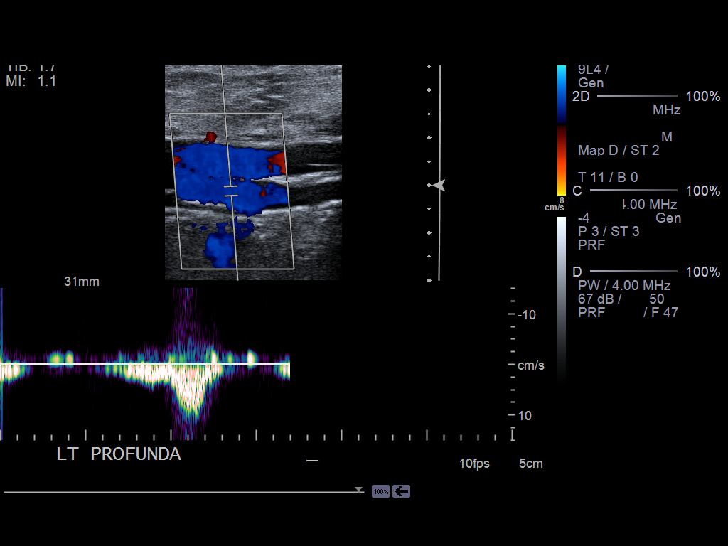
[im 53/68]
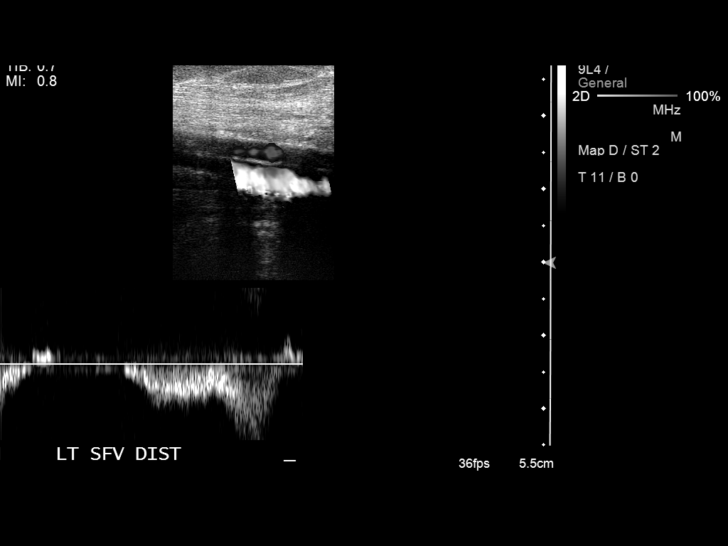
[im 56/68]
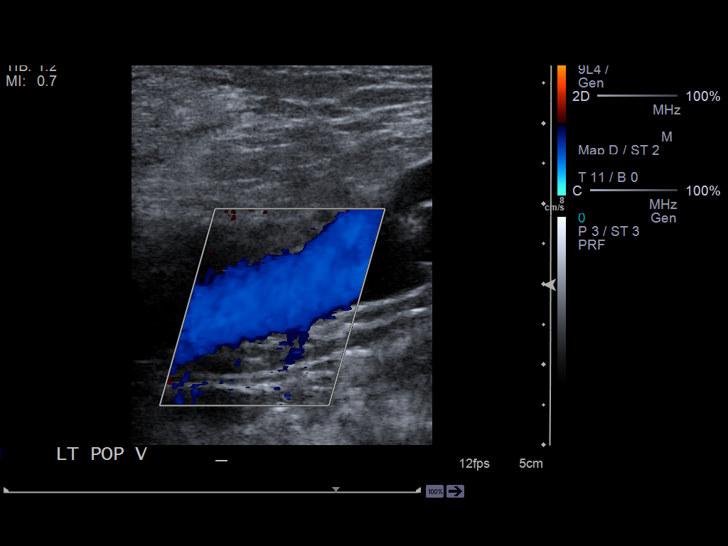
[im 62/68]
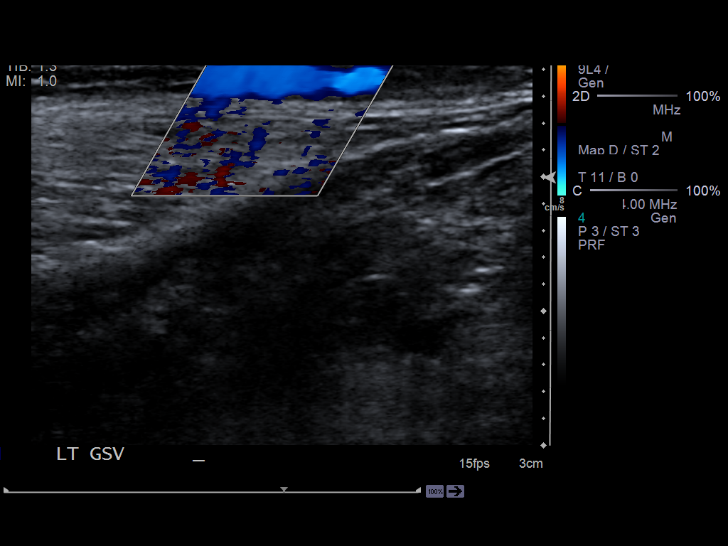
[im 68/68]
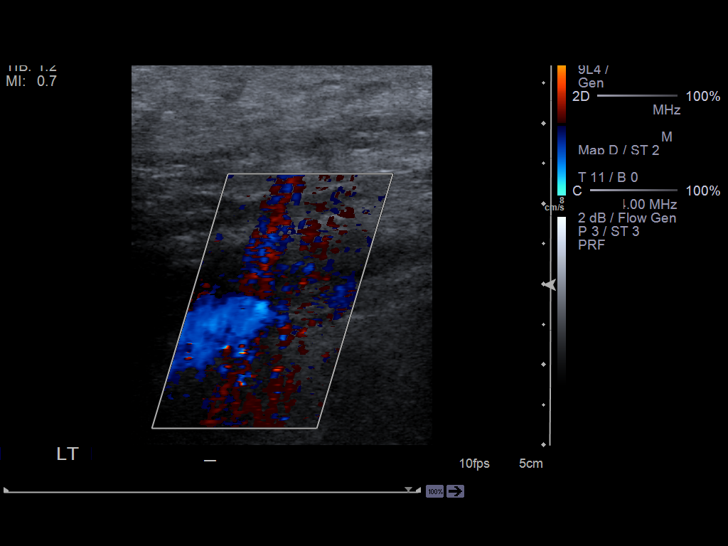

[14 of 24 positions shown; findings below may reference images not displayed]

FINDINGS: There is complete compressibility of the bilateral common femoral,
femoral, and popliteal veins. Doppler analysis demonstrates
respiratory phasicity and augmentation of flow upon calf
compression. No obvious calf vein or superficial vein thrombosis.
IMPRESSION: No evidence of lower extremity DVT.

## 2016-03-08 IMAGING — CR DG ABDOMEN 1V
1 series · 2 of 2 positions shown · non-contrast
Comparison: None.

CLINICAL DATA: Shortness of breath and left lower quadrant pain.

EXAM:
ABDOMEN - 1 VIEW

[Series 3: t abdomen supine · 0.14mm/px · 2 of 2 slices shown]
[im 1/2]
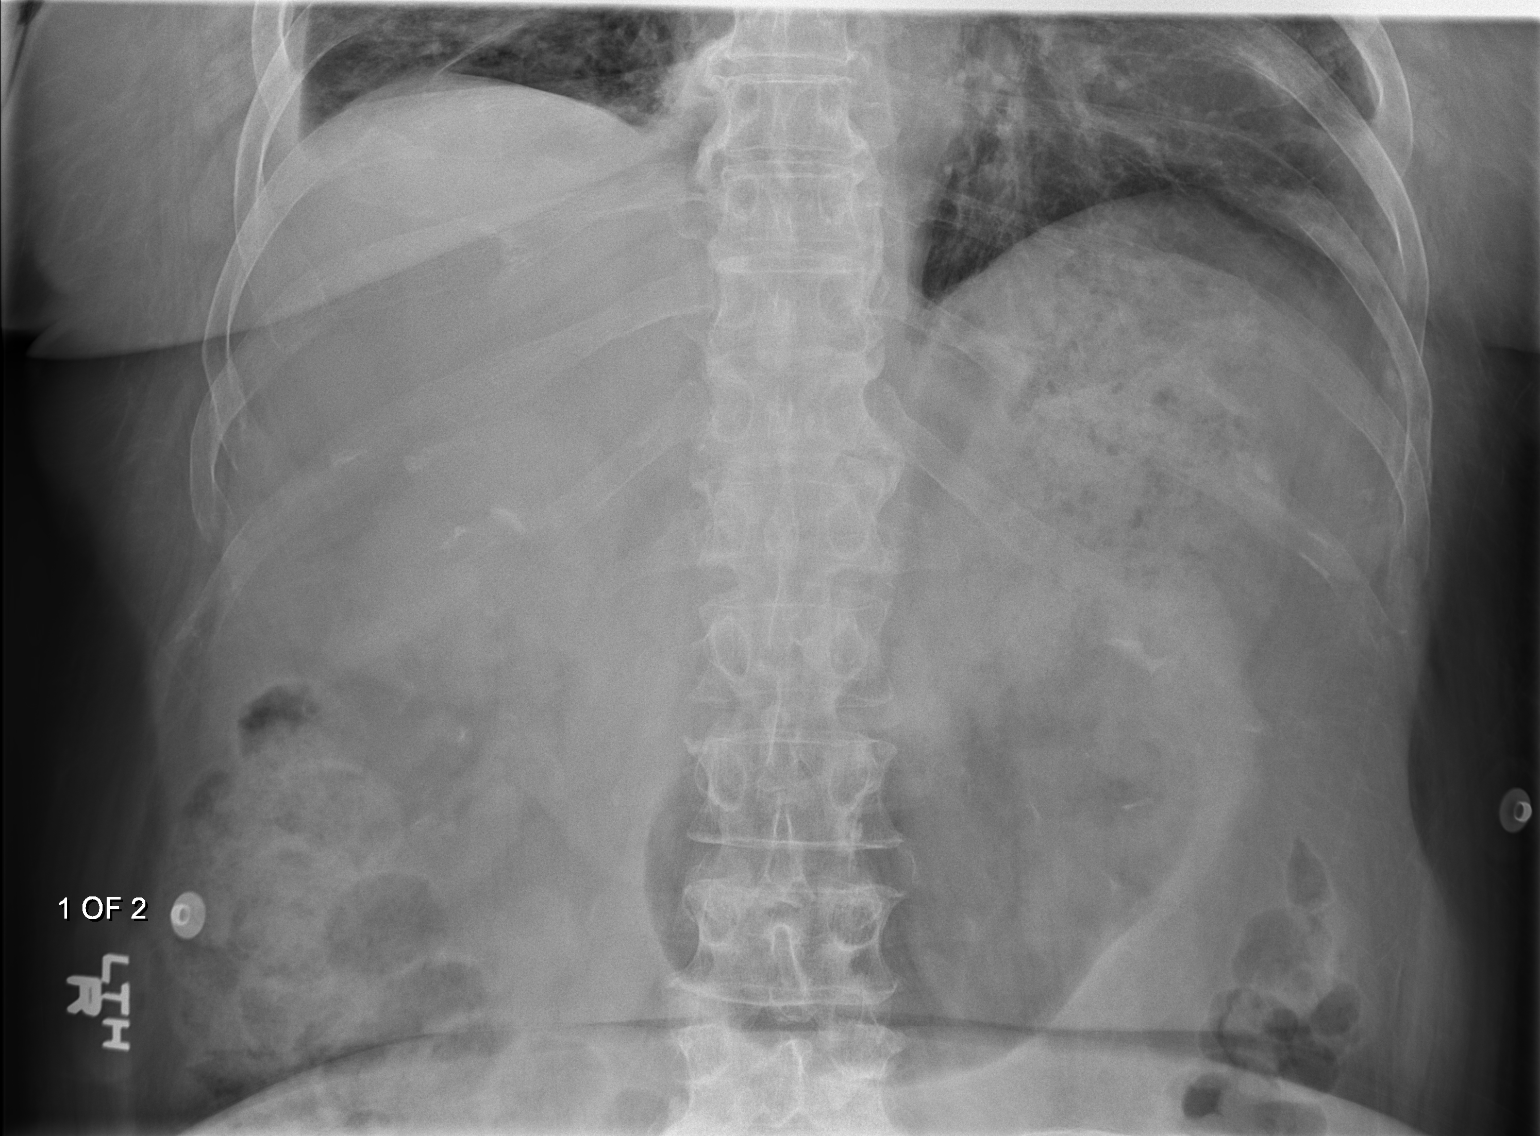
[im 2/2]
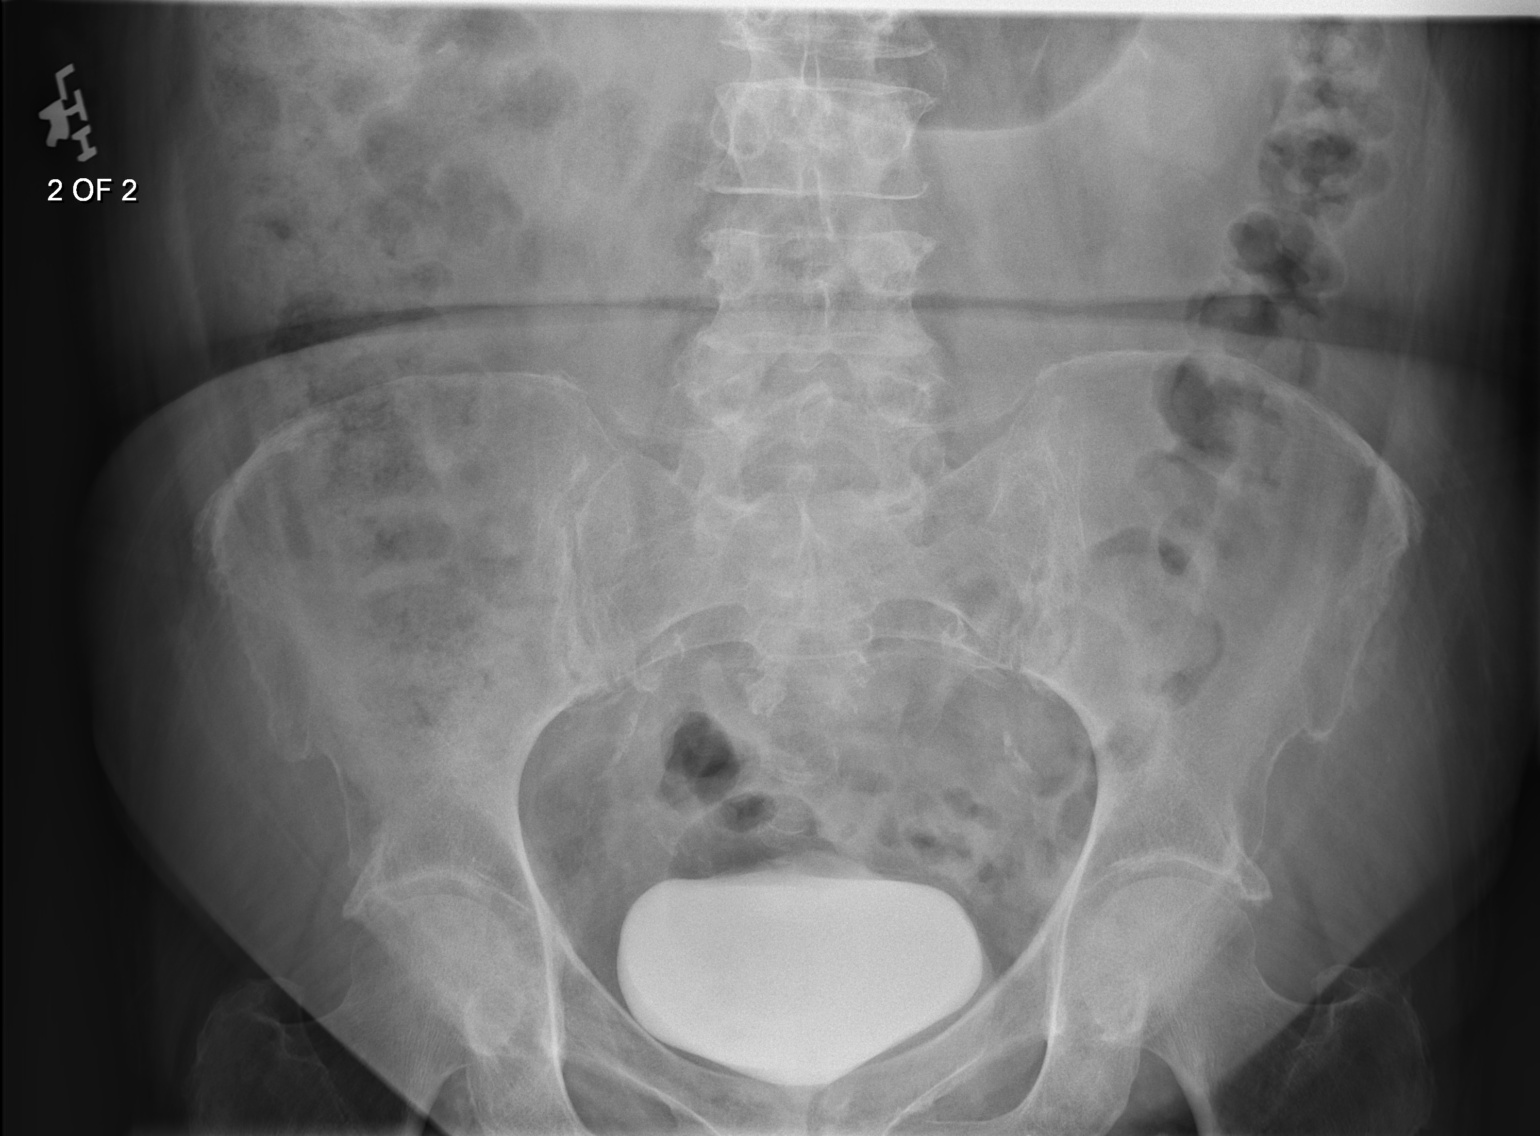

[2 of 2 positions shown; findings below may reference images not displayed]

FINDINGS: Supine film shows no gaseous bowel dilatation to suggest
obstruction. Air is seen scattered along the length of the
nondilated colon. Opacified urine in the bladder is compatible with
the CT chest performed earlier today. Bones are diffusely
demineralized.
IMPRESSION: Normal bowel gas pattern without evidence for bowel obstruction.

## 2016-03-08 IMAGING — CR DG CHEST 2V
1 series · 2 of 2 positions shown · non-contrast
Comparison: 10/27/2012

CLINICAL DATA: Chest pain and shortness of breath.

EXAM:
CHEST - 2 VIEW

[Series 1: w chest lat · 0.14mm/px · 2 of 2 slices shown]
[im 1/2]
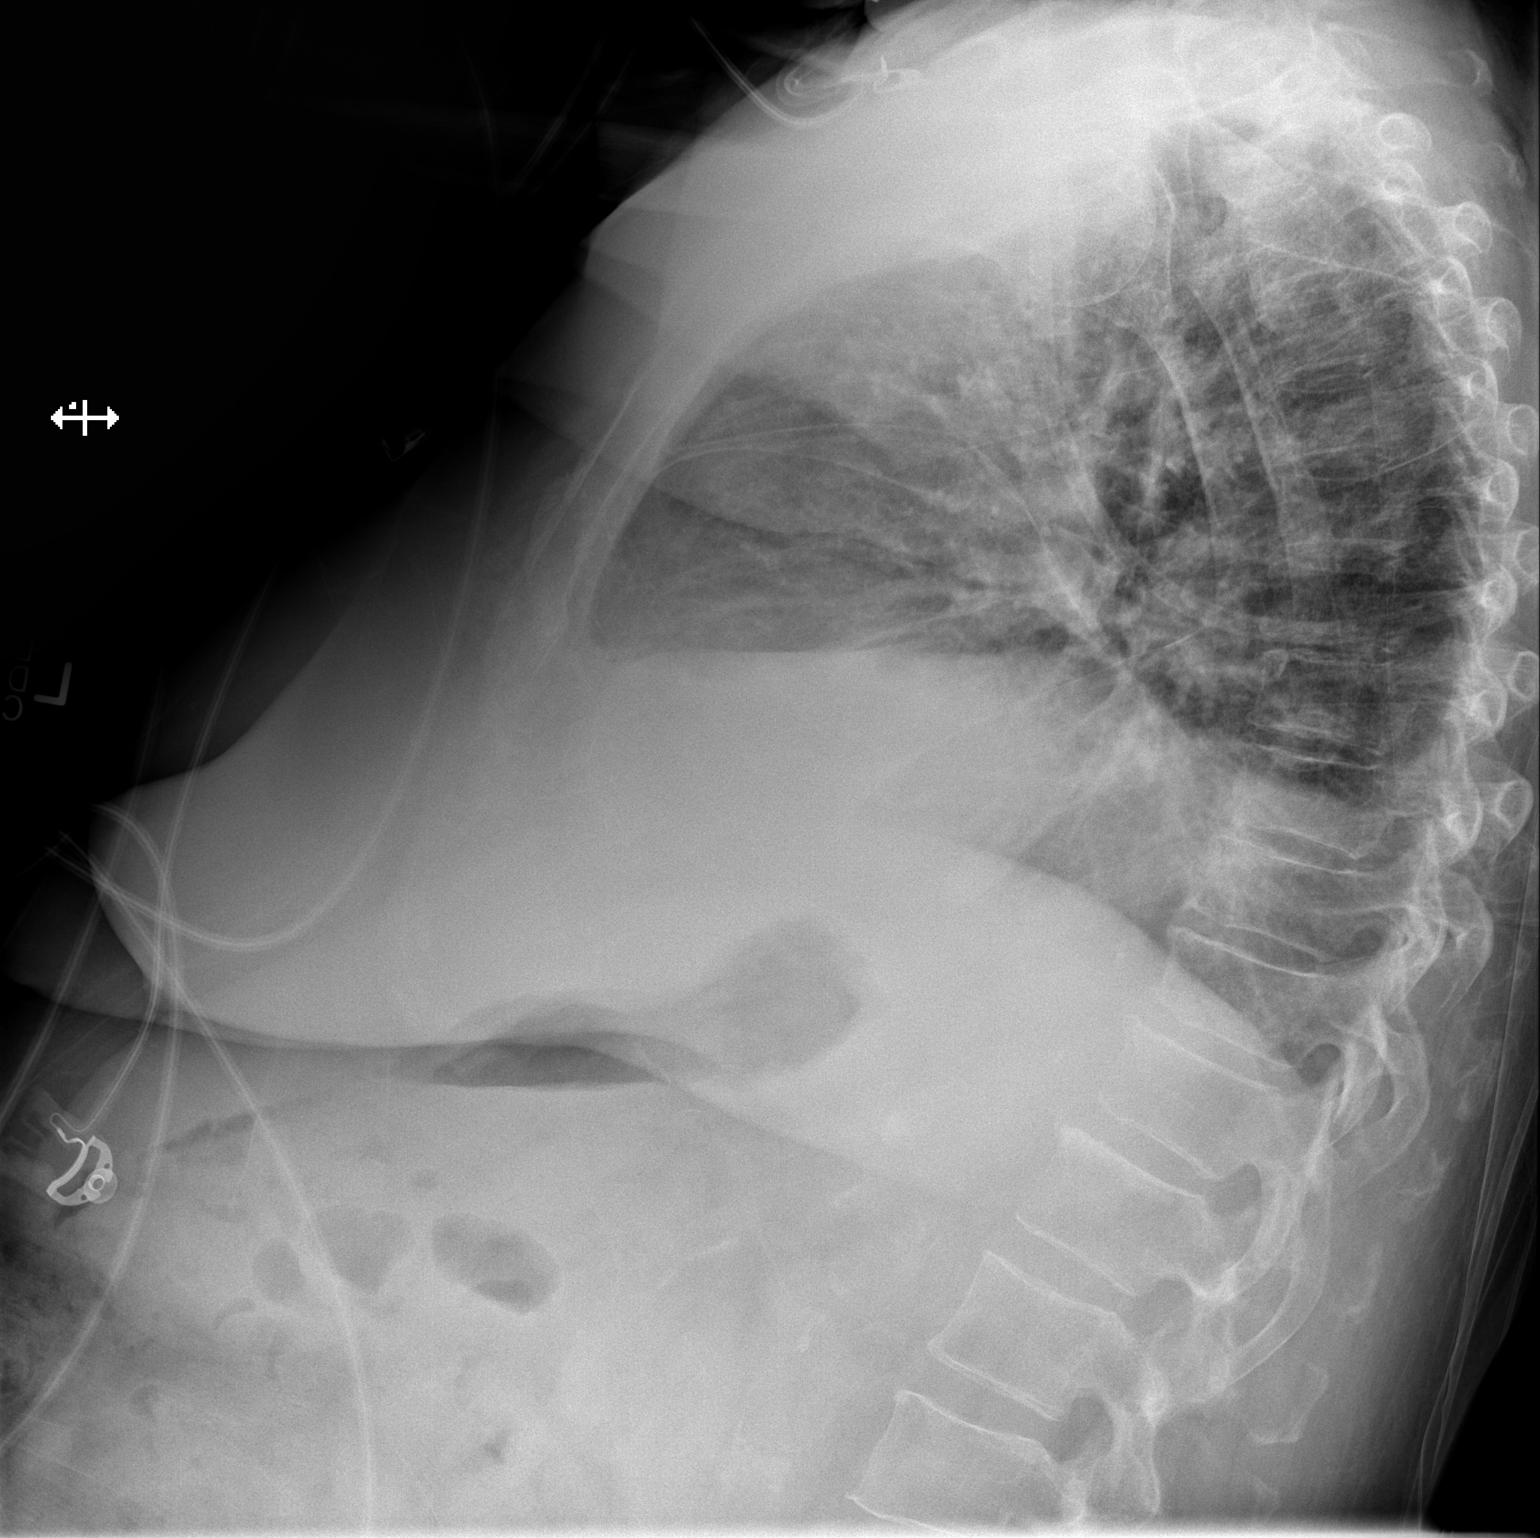
[im 2/2]
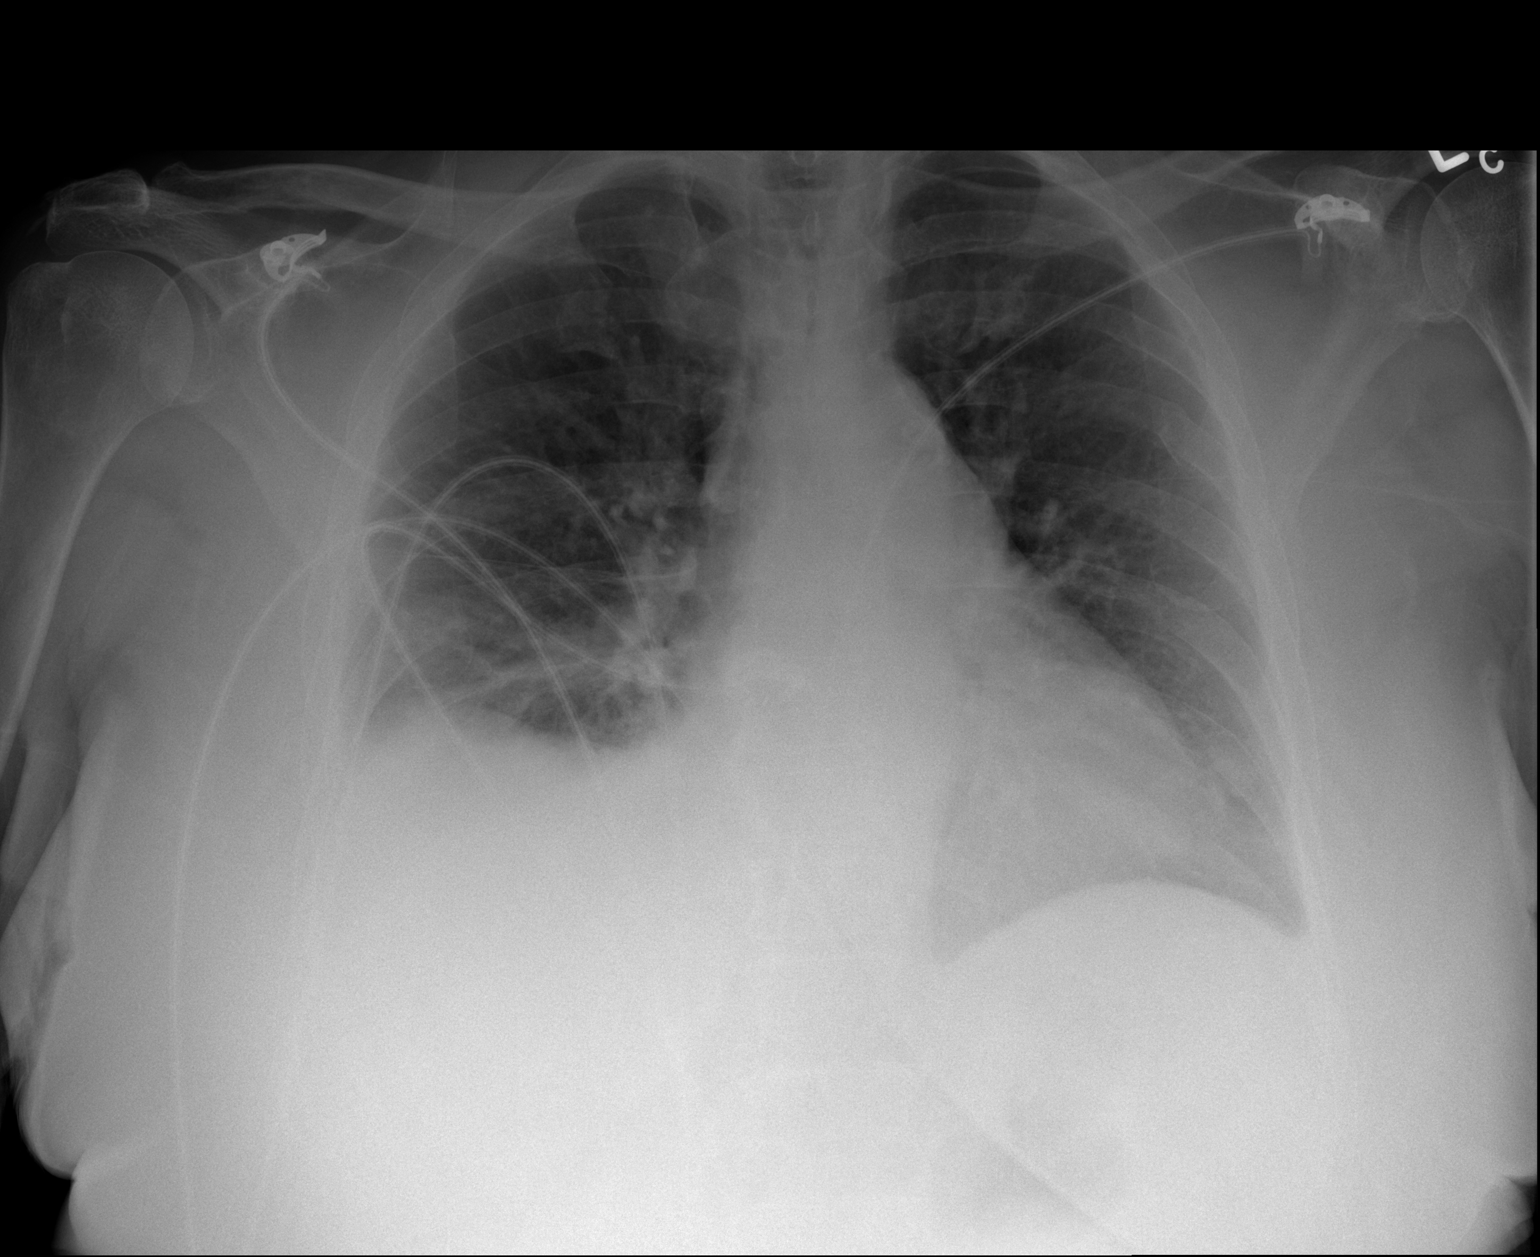

[2 of 2 positions shown; findings below may reference images not displayed]

FINDINGS: The heart size and mediastinal contours are within normal limits.
There is elevation of the right hemidiaphragm with potential
right-sided pleural fluid. No overt edema or focal airspace
consolidation. The visualized skeletal structures are unremarkable.
IMPRESSION: Elevated right hemidiaphragm with potential right-sided pleural
effusion.

## 2016-03-09 IMAGING — US US GUIDE NEEDLE - US [PERSON_NAME]
1 series · 5 of 5 positions shown · non-contrast
Comparison: CT on 11/14/2013.

CLINICAL DATA: Respiratory failure and right pleural effusion.

EXAM:
ULTRASOUND GUIDED RIGHT THORACENTESIS

[Series 1: us guide needle - us (person_name) · 0.28mm/px · 5 of 5 slices shown]
[im 1/5]
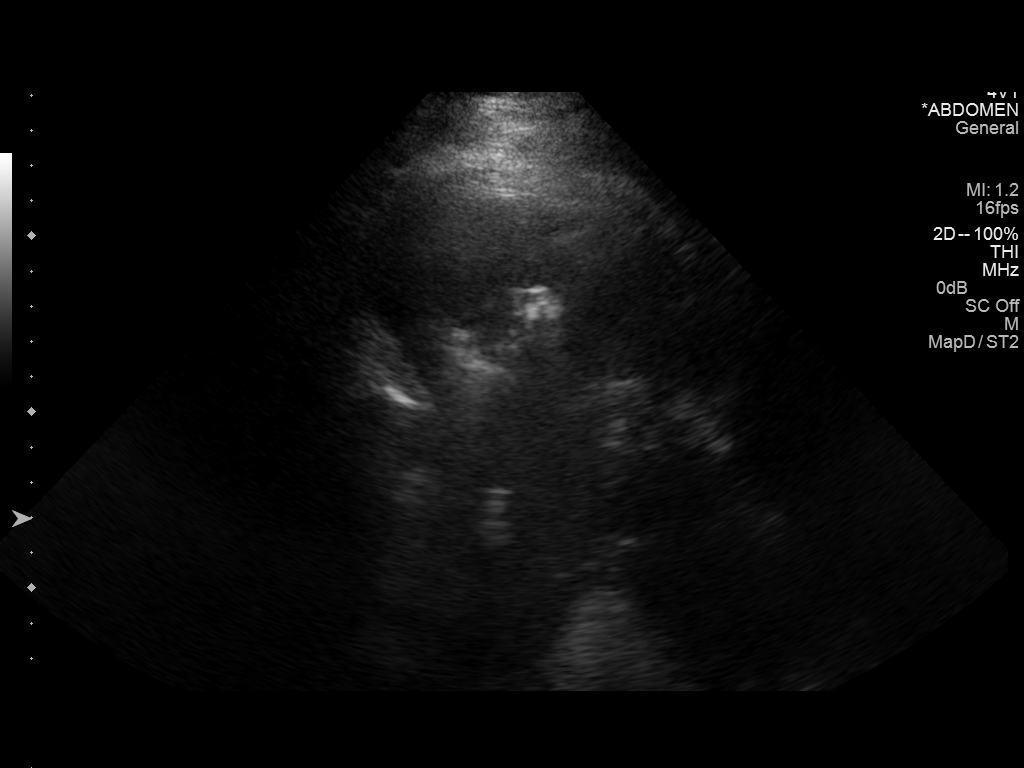
[im 2/5]
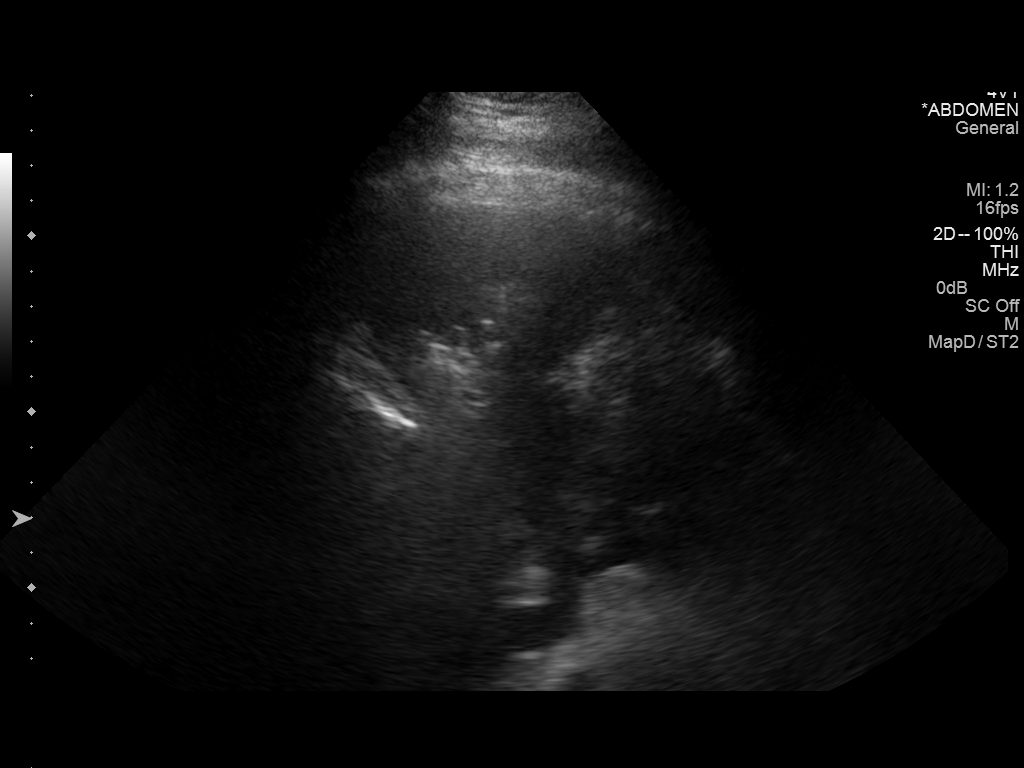
[im 3/5]
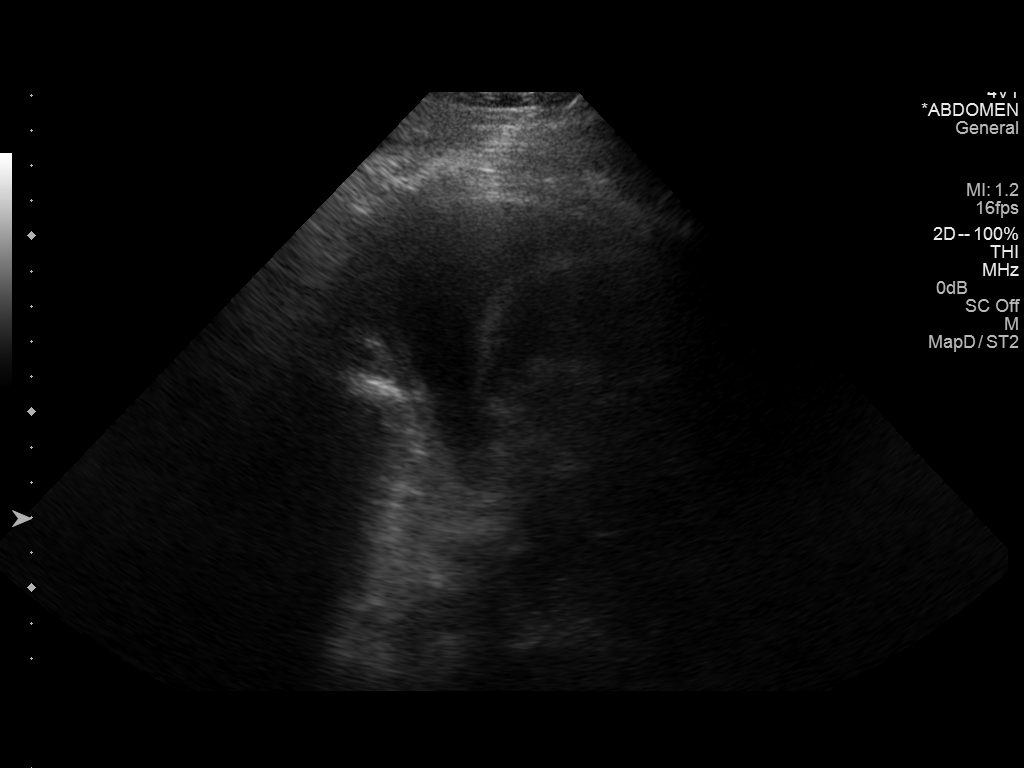
[im 4/5]
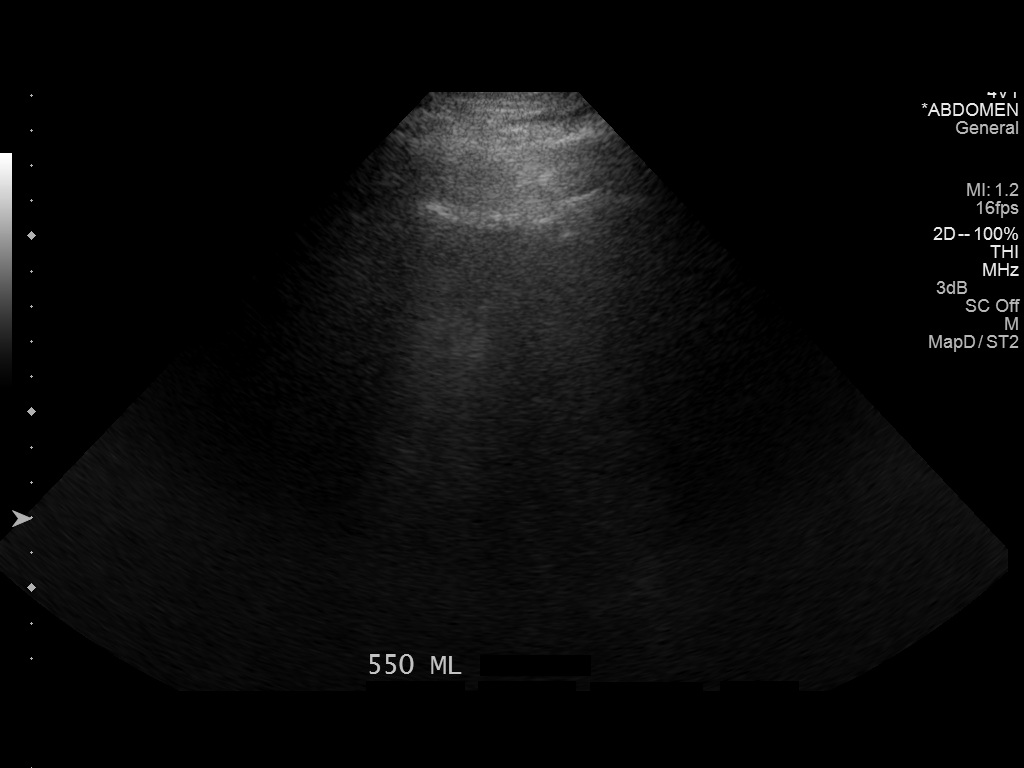
[im 5/5]
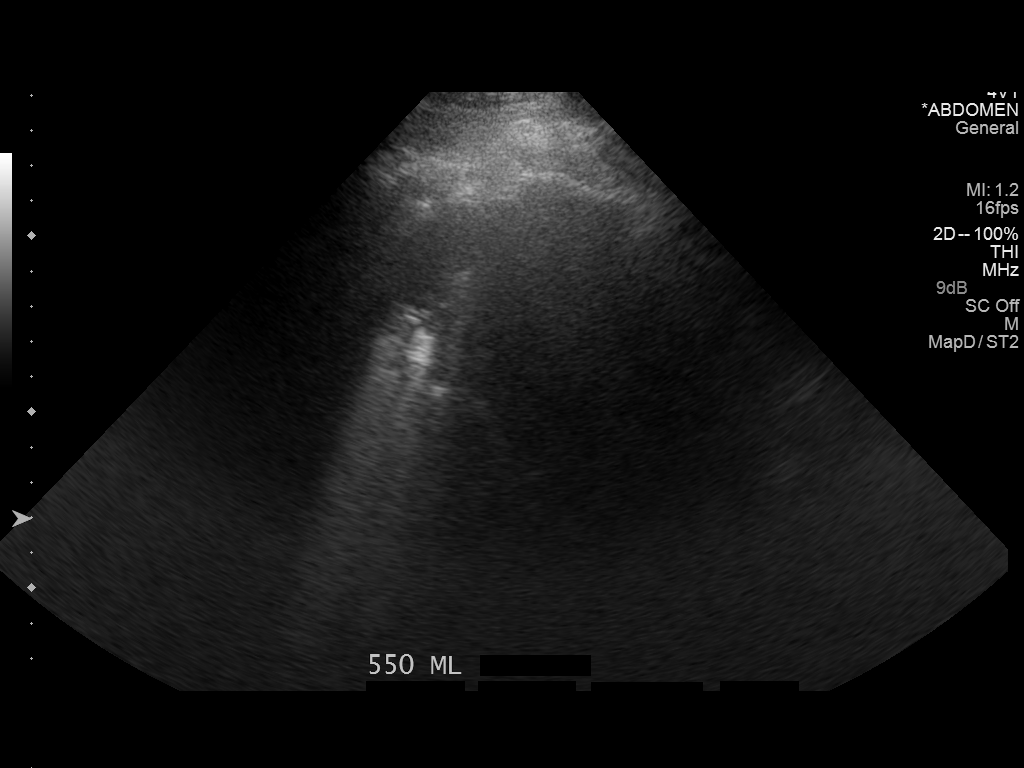

[5 of 5 positions shown; findings below may reference images not displayed]

PROCEDURE:
An ultrasound guided thoracentesis was thoroughly discussed with the
patient and questions answered. The benefits, risks, alternatives
and complications were also discussed. The patient understands and
wishes to proceed with the procedure. Written consent was obtained.

Ultrasound was performed to localize and mark an adequate pocket of
fluid in the right posterior chest. The area was then prepped and
draped in the normal sterile fashion. 1% Lidocaine was used for
local anesthesia. Under ultrasound guidance a 6 French
Safe-T-Centesis catheter was introduced. Thoracentesis was
performed. The catheter was removed and a dressing applied.

Complications:  None.
FINDINGS: A total of approximately 550 mL of cloudy fluid was removed. A fluid
sample was sent for laboratory analysis.
IMPRESSION: Successful ultrasound guided right thoracentesis yielding 550 mL of
cloudy pleural fluid.

## 2016-03-10 ENCOUNTER — Ambulatory Visit: Payer: Medicare Other

## 2016-03-10 ENCOUNTER — Encounter: Payer: Self-pay | Admitting: *Deleted

## 2016-03-10 DIAGNOSIS — G4733 Obstructive sleep apnea (adult) (pediatric): Secondary | ICD-10-CM

## 2016-03-10 NOTE — Progress Notes (Signed)
Pulmonary Individual Treatment Plan  Patient Details  Name: RICHANDA DARIN MRN: [id number] Date of Birth: [birth year] Referring Provider:   Flowsheet Row Pulmonary Rehab from 02/12/2016 in [facility] Cardiac and Pulmonary Rehab  Referring Provider  Tullo      Initial Encounter Date:  Flowsheet Row Pulmonary Rehab from 02/12/2016 in [facility] Cardiac and Pulmonary Rehab  Date  02/12/16  Referring Provider  Derrel Nip      Visit Diagnosis: Obstructive sleep apnea syndrome  Patient's Home Medications on Admission:  Current Outpatient Prescriptions:  .  acetaminophen (TYLENOL) 500 MG tablet, Take 500 mg by mouth every 6 (six) hours as needed for mild pain or headache., Disp: , Rfl:  .  albuterol (PROAIR HFA) 108 (90 Base) MCG/ACT inhaler, Inhale 2 puffs into the lungs every 6 (six) hours as needed for wheezing or shortness of breath., Disp: 1 Inhaler, Rfl: 2 .  albuterol (PROVENTIL) (2.5 MG/3ML) 0.083% nebulizer solution, Take 3 mLs (2.5 mg total) by nebulization every 6 (six) hours as needed for wheezing or shortness of breath., Disp: 150 mL, Rfl: 1 .  alendronate (FOSAMAX) 70 MG tablet, Take 70 mg by mouth once a week. Pt takes on Sunday.   Take with a full glass of water on an empty stomach., Disp: , Rfl:  .  ALPRAZolam (XANAX) 0.5 MG tablet, Take 0.5 mg by mouth daily., Disp: , Rfl:  .  aspirin EC 81 MG tablet, Take 81 mg by mouth daily., Disp: , Rfl:  .  B-D UF III MINI PEN NEEDLES 31G X 5 MM MISC, USE THREE TIMES A DAY, Disp: 270 each, Rfl: 3 .  benzonatate (TESSALON) 100 MG capsule, Take 200 mg by mouth 3 (three) times daily as needed for cough. Reported on 11/09/2015, Disp: , Rfl:  .  BRILINTA 90 MG TABS tablet, Take 1 tablet (90 mg total) by mouth 2 (two) times daily., Disp: 180 tablet, Rfl: 2 .  budesonide-formoterol (SYMBICORT) 160-4.5 MCG/ACT inhaler, Inhale 2 puffs into the lungs 2 (two) times daily., Disp: 1 Inhaler, Rfl: 0 .  docusate sodium (COLACE) 100 MG capsule, Take 100 mg by mouth 2  (two) times daily as needed for mild constipation. Reported on 11/09/2015, Disp: , Rfl:  .  fluticasone (FLONASE) 50 MCG/ACT nasal spray, Place 2 sprays into both nostrils daily as needed for rhinitis., Disp: , Rfl:  .  furosemide (LASIX) 20 MG tablet, Take 1 tablet (20 mg total) by mouth daily., Disp: 90 tablet, Rfl: 1 .  gabapentin (NEURONTIN) 100 MG capsule, Take 1 capsule (100 mg total) by mouth at bedtime., Disp: 30 capsule, Rfl: 0 .  hydrOXYzine (ATARAX/VISTARIL) 10 MG tablet, Take 1 tablet (10 mg total) by mouth 3 (three) times daily as needed., Disp: 30 tablet, Rfl: 0 .  Insulin Lispro Prot & Lispro (HUMALOG MIX 75/25 KWIKPEN) (75-25) 100 UNIT/ML Kwikpen, Inject 45 to 60 units twice daily before morning and evening meals (Patient taking differently: Inject 44-60 Units into the skin. Inject 45 to 60 units twice daily before morning and evening meals), Disp: 112 mL, Rfl: 3 .  Ipratropium-Albuterol (COMBIVENT RESPIMAT) 20-100 MCG/ACT AERS respimat, Inhale 1 puff into the lungs 3 (three) times daily as needed for wheezing or shortness of breath. Reported on 10/25/2015, Disp: , Rfl:  .  isosorbide mononitrate (IMDUR) 30 MG 24 hr tablet, TAKE 1 TABLET DAILY, Disp: 90 tablet, Rfl: 1 .  losartan (COZAAR) 100 MG tablet, TAKE 1 TABLET DAILY, Disp: 90 tablet, Rfl: 1 .  metoprolol tartrate (LOPRESSOR)  25 MG tablet, Take 0.5 tablets (12.5 mg total) by mouth 2 (two) times daily., Disp: , Rfl:  .  nitroGLYCERIN (NITROSTAT) 0.4 MG SL tablet, Place 1 tablet (0.4 mg total) under the tongue every 5 (five) minutes as needed for chest pain., Disp: 25 tablet, Rfl: 3 .  ondansetron (ZOFRAN) 8 MG tablet, Take 1 tablet (8 mg total) by mouth every 8 (eight) hours as needed for nausea or vomiting., Disp: 15 tablet, Rfl: 0 .  PARoxetine (PAXIL) 20 MG tablet, TAKE ONE TABLET BY MOUTH ONCE DAILY, Disp: 30 tablet, Rfl: 1 .  polyethylene glycol (MIRALAX / GLYCOLAX) packet, Take 17 g by mouth daily as needed for mild  constipation. Reported on 10/25/2015, Disp: , Rfl:  .  pregabalin (LYRICA) 100 MG capsule, Take 1 capsule (100 mg total) by mouth 3 (three) times daily., Disp: 270 capsule, Rfl: 2 .  rosuvastatin (CRESTOR) 20 MG tablet, Take 20 mg by mouth at bedtime., Disp: , Rfl:  .  traMADol (ULTRAM) 50 MG tablet, Take 50 mg by mouth every 6 (six) hours as needed for moderate pain., Disp: , Rfl:  .  Vitamin D, Ergocalciferol, (DRISDOL) 50000 units CAPS capsule, Take 50,000 Units by mouth every 7 (seven) days. Pt takes on Sunday., Disp: , Rfl:  No current facility-administered medications for this visit.   Facility-Administered Medications Ordered in Other Visits:  .  albuterol (PROVENTIL) (5 MG/ML) 0.5% nebulizer solution 2.5 mg, 2.5 mg, Nebulization, Once, [name], MD  Past Medical History: Past Medical History:  Diagnosis Date  . Acute respiratory failure ([place]) 11/21/2014  . Arthritis 08/05/2013  . Atherosclerotic peripheral vascular disease with gangrene [facility]) august 2012  . Cardiomyopathy, ischemic    a. EF 35 to 40% by echo in 2013 b. EF improved to 50-55% by echo in 04/2015.  [name] Cerebral infarct ([place]) 08/17/2013  . Chronic diastolic CHF (congestive heart failure) (HCC)    a. EF 50-55% by echo in 04/2015  . COPD (chronic obstructive pulmonary disease) ([place])   . Coronary artery disease, occlusive    a. Previous PCI to the LAD, LCx, and RCA in 2010, 2011, 2013, and 2016. All performed in [place].  . 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 Diabetic diarrhea ([place]) 10/03/2014  . DM type 2, uncontrolled, with renal complications ([place]) 12/11/4233  . Hepatic steatosis    by CT abd pelvis  . Hyperlipidemia LDL goal <100 02/23/2014  . Hypertensive heart disease   . Osteoporosis, post-menopausal   . 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 Pleural effusion 10/25/2012   10/2012 CT chest >> small to moderate R lung effusion>>  chylothorax, 100% lymphs 10/2013 thoracentesis> cytology negative, WBC 1471, > 90% "small lymphs" 01/2014 CT chest> near complete resolution of pleural effusion, stable lymphadenopathy 08/2014 CT chest [place] ([facility]): small right sided effusion decreased in size, stable mediastinal lymphadenopathy 1.0cm largest, 28m  . 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 Renal insufficiency   . Sarcoidosis ([place])     Tobacco Use: History  Smoking Status  . Never Smoker  Smokeless Tobacco  . Never Used    Labs: Recent Review  Flowsheet Data    Labs for ITP Cardiac and Pulmonary Rehab Latest Ref Rng & Units 06/26/2015 09/26/2015 11/06/2015 12/13/2015 01/02/2016   Cholestrol 0 - 200 mg/dL - - 135 - 123   LDLCALC 0 - 99 mg/dL - - 74 - 64   LDLDIRECT mg/dL - 73.0 - - -   HDL >40 mg/dL - - 31.90(L) - 29(L)   Trlycerides <150 mg/dL - - 143.0 - 151(H)   Hemoglobin A1c - - 9.6(H) - 7.7 -   HCO3 21.0 - 28.0 mEq/L 25.8 - - - -       ADL UCSD:     Pulmonary Assessment Scores    Row Name 02/12/16 1239         ADL UCSD   ADL Phase Entry     SOB Score total 44     Rest 0     Walk 3     Stairs 4     Bath 0     Dress 0     Shop 4        Pulmonary Function Assessment:   Exercise Target Goals:    Exercise Program Goal: Individual exercise prescription set with THRR, safety & activity barriers. Participant demonstrates ability to understand and report RPE using BORG scale, to self-measure pulse accurately, and to acknowledge the importance of the exercise prescription.  Exercise Prescription Goal: Starting with aerobic activity 30 plus minutes a day, 3 days per week for initial exercise prescription. Provide home exercise prescription and  guidelines that participant acknowledges understanding prior to discharge.  Activity Barriers & Risk Stratification:     Activity Barriers & Cardiac Risk Stratification - 02/12/16 1237      Activity Barriers & Cardiac Risk Stratification   Activity Barriers Shortness of Breath;Deconditioning   Cardiac Risk Stratification Moderate      6 Minute Walk:     6 Minute Walk    Row Name 02/12/16 1152         6 Minute Walk   Distance 885 feet     Walk Time 5.49 minutes     # of Rest Breaks 2     MPH 1.83     METS 2.38     RPE 13     Perceived Dyspnea  5     VO2 Peak 5.66     Symptoms Yes (comment)     Comments Shortness of breath - stopped to rest then continued     Resting HR 60 bpm     Resting BP 136/62     Max Ex. HR 77 bpm     Max Ex. BP 148/58       Interval HR   Baseline HR 60     1 Minute HR 64     2 Minute HR 66     3 Minute HR 67     4 Minute HR 75     5 Minute HR 76     6 Minute HR 77     2 Minute Post HR 73     Interval Heart Rate? Yes       Interval Oxygen   Interval Oxygen? Yes     Baseline Oxygen Saturation % 98 %     1 Minute Oxygen Saturation % 95 %     2 Minute Oxygen Saturation % 93 %     3 Minute Oxygen Saturation % 91 %     4 Minute Oxygen Saturation % 95 %  5 Minute Oxygen Saturation % 94 %     6 Minute Oxygen Saturation % 96 %     2 Minute Post Oxygen Saturation % 99 %        Initial Exercise Prescription:     Initial Exercise Prescription - 02/12/16 1200      Date of Initial Exercise RX and Referring Provider   Date 02/12/16   Referring Provider Tullo     Treadmill   MPH 1.5   Grade 0   Minutes 15  5/5/5   METs 2.15     NuStep   Level 2   Minutes 15   METs 2     Biostep-RELP   Level 2   Minutes 15   METs 2     Prescription Details   Frequency (times per week) 3   Duration Progress to 45 minutes of aerobic exercise without signs/symptoms of physical distress     Intensity   THRR 40-80% of Max Heartrate 94-128    Ratings of Perceived Exertion 11-13   Perceived Dyspnea 0-4     Progression   Progression Continue to progress workloads to maintain intensity without signs/symptoms of physical distress.     Resistance Training   Training Prescription Yes   Weight 2   Reps 10-12      Perform Capillary Blood Glucose checks as needed.  Exercise Prescription Changes:   Exercise Comments:   Discharge Exercise Prescription (Final Exercise Prescription Changes):    Nutrition:  Target Goals: Understanding of nutrition guidelines, daily intake of sodium 1500mg , cholesterol 200mg , calories 30% from fat and 7% or less from saturated fats, daily to have 5 or more servings of fruits and vegetables.  Biometrics:     Pre Biometrics - 02/12/16 1152      Pre Biometrics   Height 5\' 7"  (1.702 m)   Weight 193 lb (87.5 kg)   Waist Circumference 45 inches   Hip Circumference 39 inches   Waist to Hip Ratio 1.15 %   BMI (Calculated) 30.3       Nutrition Therapy Plan and Nutrition Goals:   Nutrition Discharge: Rate Your Plate Scores:   Psychosocial: Target Goals: Acknowledge presence or absence of depression, maximize coping skills, provide positive support system. Participant is able to verbalize types and ability to use techniques and skills needed for reducing stress and depression.  Initial Review & Psychosocial Screening:     Initial Psych Review & Screening - 02/12/16 1248      Family Dynamics   Comments Ms Bassford has very little support. Her children live in [place] and her husband has medical problems himself. She enjoys socialization, being with her friens, and will enjpy being in the exercise group.     Barriers   Psychosocial barriers to participate in program The patient should benefit from training in stress management and relaxation.     Screening Interventions   Interventions Encouraged to exercise;Program counselor consult      Quality of Life Scores:      Quality of Life - 02/12/16 1250      Quality of Life Scores   Health/Function Pre 21 %   Socioeconomic Pre 21 %   Psych/Spiritual Pre 29.57 %   Family Pre 21 %   GLOBAL Pre 22.94 %      PHQ-9: Recent Review Flowsheet Data    Depression screen [facility] 2/9 02/12/2016 12/03/2015 10/10/2015 10/30/2014 10/30/2014   Decreased Interest 1 0 1 0 0   Down, Depressed, Hopeless  2 0 0 0 0   PHQ - 2 Score 3 0 1 0 0   Altered sleeping 0 - - - -   Tired, decreased energy 2 - - - -   Change in appetite 1 - - - -   Feeling bad or failure about yourself  0 - - - -   Trouble concentrating 0 - - - -   Moving slowly or fidgety/restless 1 - - - -   Suicidal thoughts 0 - - - -   PHQ-9 Score 7 - - - -   Difficult doing work/chores Somewhat difficult - - - -      Psychosocial Evaluation and Intervention:   Psychosocial Re-Evaluation:  Education: Education Goals: Education classes will be provided on a weekly basis, covering required topics. Participant will state understanding/return demonstration of topics presented.  Learning Barriers/Preferences:     Learning Barriers/Preferences - 02/12/16 1238      Learning Barriers/Preferences   Learning Barriers Reading   Learning Preferences None      Education Topics: Initial Evaluation Education: - Verbal, written and demonstration of respiratory meds, RPE/PD scales, oximetry and breathing techniques. Instruction on use of nebulizers and MDIs: cleaning and proper use, rinsing mouth with steroid doses and importance of monitoring MDI activations. Flowsheet Row Pulmonary Rehab from 02/12/2016 in [facility] Cardiac and Pulmonary Rehab  Date  02/12/16  Educator  LB  Instruction Review Code  2- meets goals/outcomes      General Nutrition Guidelines/Fats and Fiber: -Group instruction provided by verbal, written material, models and posters to present the general guidelines for heart healthy nutrition. Gives an explanation and review of dietary fats and  fiber.   Controlling Sodium/Reading Food Labels: -Group verbal and written material supporting the discussion of sodium use in heart healthy nutrition. Review and explanation with models, verbal and written materials for utilization of the food label.   Exercise Physiology & Risk Factors: - Group verbal and written instruction with models to review the exercise physiology of the cardiovascular system and associated critical values. Details cardiovascular disease risk factors and the goals associated with each risk factor.   Aerobic Exercise & Resistance Training: - Gives group verbal and written discussion on the health impact of inactivity. On the components of aerobic and resistive training programs and the benefits of this training and how to safely progress through these programs.   Flexibility, Balance, General Exercise Guidelines: - Provides group verbal and written instruction on the benefits of flexibility and balance training programs. Provides general exercise guidelines with specific guidelines to those with heart or lung disease. Demonstration and skill practice provided.   Stress Management: - Provides group verbal and written instruction about the health risks of elevated stress, cause of high stress, and healthy ways to reduce stress.   Depression: - Provides group verbal and written instruction on the correlation between heart/lung disease and depressed mood, treatment options, and the stigmas associated with seeking treatment.   Exercise & Equipment Safety: - Individual verbal instruction and demonstration of equipment use and safety with use of the equipment.   Infection Prevention: - Provides verbal and written material to individual with discussion of infection control including proper hand washing and proper equipment cleaning during exercise session. Flowsheet Row Pulmonary Rehab from 02/12/2016 in [facility] Cardiac and Pulmonary Rehab  Date  02/12/16  Educator  LB   Instruction Review Code  2- meets goals/outcomes      Falls Prevention: - Provides verbal and written material to individual  with discussion of falls prevention and safety. Flowsheet Row Pulmonary Rehab from 02/12/2016 in [facility] Cardiac and Pulmonary Rehab  Date  02/12/16  Educator  LB  Instruction Review Code  2- meets goals/outcomes      Diabetes: - Individual verbal and written instruction to review signs/symptoms of diabetes, desired ranges of glucose level fasting, after meals and with exercise. Advice that pre and post exercise glucose checks will be done for 3 sessions at entry of program.   Chronic Lung Diseases: - Group verbal and written instruction to review new updates, new respiratory medications, new advancements in procedures and treatments. Provide informative websites and "800" numbers of self-education.   Lung Procedures: - Group verbal and written instruction to describe testing methods done to diagnose lung disease. Review the outcome of test results. Describe the treatment choices: Pulmonary Function Tests, ABGs and oximetry.   Energy Conservation: - Provide group verbal and written instruction for methods to conserve energy, plan and organize activities. Instruct on pacing techniques, use of adaptive equipment and posture/positioning to relieve shortness of breath.   Triggers: - Group verbal and written instruction to review types of environmental controls: home humidity, furnaces, filters, dust mite/pet prevention, HEPA vacuums. To discuss weather changes, air quality and the benefits of nasal washing.   Exacerbations: - Group verbal and written instruction to provide: warning signs, infection symptoms, calling MD promptly, preventive modes, and value of vaccinations. Review: effective airway clearance, coughing and/or vibration techniques. Create an Sports administrator.   Oxygen: - Individual and group verbal and written instruction on oxygen therapy. Includes  supplement oxygen, available portable oxygen systems, continuous and intermittent flow rates, oxygen safety, concentrators, and Medicare reimbursement for oxygen.   Respiratory Medications: - Group verbal and written instruction to review medications for lung disease. Drug class, frequency, complications, importance of spacers, rinsing mouth after steroid MDI's, and proper cleaning methods for nebulizers. Flowsheet Row Pulmonary Rehab from 02/12/2016 in [facility] Cardiac and Pulmonary Rehab  Date  02/12/16  Educator  LB  Instruction Review Code  2- meets goals/outcomes      AED/CPR: - Group verbal and written instruction with the use of models to demonstrate the basic use of the AED with the basic ABC's of resuscitation.   Breathing Retraining: - Provides individuals verbal and written instruction on purpose, frequency, and proper technique of diaphragmatic breathing and pursed-lipped breathing. Applies individual practice skills. Flowsheet Row Pulmonary Rehab from 02/12/2016 in [facility] Cardiac and Pulmonary Rehab  Date  02/12/16  Educator  LB  Instruction Review Code  2- meets goals/outcomes      Anatomy and Physiology of the Lungs: - Group verbal and written instruction with the use of models to provide basic lung anatomy and physiology related to function, structure and complications of lung disease.   Heart Failure: - Group verbal and written instruction on the basics of heart failure: signs/symptoms, treatments, explanation of ejection fraction, enlarged heart and cardiomyopathy.   Sleep Apnea: - Individual verbal and written instruction to review Obstructive Sleep Apnea. Review of risk factors, methods for diagnosing and types of masks and machines for OSA.   Anxiety: - Provides group, verbal and written instruction on the correlation between heart/lung disease and anxiety, treatment options, and management of anxiety.   Relaxation: - Provides group, verbal and written  instruction about the benefits of relaxation for patients with heart/lung disease. Also provides patients with examples of relaxation techniques.   Knowledge Questionnaire Score:     Knowledge Questionnaire Score - 02/12/16 1238  Knowledge Questionnaire Score   Pre Score 10/10       Core Components/Risk Factors/Patient Goals at Admission:     Personal Goals and Risk Factors at Admission - 02/12/16 1244      Core Components/Risk Factors/Patient Goals on Admission   Sedentary Yes   Intervention Provide advice, education, support and counseling about physical activity/exercise needs.;Develop an individualized exercise prescription for aerobic and resistive training based on initial evaluation findings, risk stratification, comorbidities and participant's personal goals.   Expected Outcomes Achievement of increased cardiorespiratory fitness and enhanced flexibility, muscular endurance and strength shown through measurements of functional capacity and personal statement of participant.   Increase Strength and Stamina Yes   Intervention Provide advice, education, support and counseling about physical activity/exercise needs.;Develop an individualized exercise prescription for aerobic and resistive training based on initial evaluation findings, risk stratification, comorbidities and participant's personal goals.   Expected Outcomes Achievement of increased cardiorespiratory fitness and enhanced flexibility, muscular endurance and strength shown through measurements of functional capacity and personal statement of participant.   Improve shortness of breath with ADL's Yes   Intervention Provide education, individualized exercise plan and daily activity instruction to help decrease symptoms of SOB with activities of daily living.   Expected Outcomes Short Term: Achieves a reduction of symptoms when performing activities of daily living.   Develop more efficient breathing techniques such as  purse lipped breathing and diaphragmatic breathing; and practicing self-pacing with activity Yes   Intervention Provide education, demonstration and support about specific breathing techniuqes utilized for more efficient breathing. Include techniques such as pursed lipped breathing, diaphragmatic breathing and self-pacing activity.   Expected Outcomes Short Term: Participant will be able to demonstrate and use breathing techniques as needed throughout daily activities.   Increase knowledge of respiratory medications and ability to use respiratory devices properly  Yes   Intervention Provide education and demonstration as needed of appropriate use of medications, inhalers, and oxygen therapy.  Symbicort and Albuterol MDI; spacer given; SVN Albuterol   Expected Outcomes Short Term: Achieves understanding of medications use. Understands that oxygen is a medication prescribed by physician. Demonstrates appropriate use of inhaler and oxygen therapy.   Diabetes Yes   Intervention Provide education about signs/symptoms and action to take for hypo/hyperglycemia.;Provide education about proper nutrition, including hydration, and aerobic/resistive exercise prescription along with prescribed medications to achieve blood glucose in normal ranges: Fasting glucose 65-99 mg/dL   Expected Outcomes Short Term: Participant verbalizes understanding of the signs/symptoms and immediate care of hyper/hypoglycemia, proper foot care and importance of medication, aerobic/resistive exercise and nutrition plan for blood glucose control.;Long Term: Attainment of HbA1C < 7%.   Heart Failure Yes   Intervention Provide a combined exercise and nutrition program that is supplemented with education, support and counseling about heart failure. Directed toward relieving symptoms such as shortness of breath, decreased exercise tolerance, and extremity edema.   Expected Outcomes Improve functional capacity of life;Short term: Attendance in  program 2-3 days a week with increased exercise capacity. Reported lower sodium intake. Reported increased fruit and vegetable intake. Reports medication compliance.;Short term: Daily weights obtained and reported for increase. Utilizing diuretic protocols set by physician.;Long term: Adoption of self-care skills and reduction of barriers for early signs and symptoms recognition and intervention leading to self-care maintenance.   Hypertension Yes   Intervention Provide education on lifestyle modifcations including regular physical activity/exercise, weight management, moderate sodium restriction and increased consumption of fresh fruit, vegetables, and low fat dairy, alcohol moderation, and smoking cessation.;Monitor prescription  use compliance.   Expected Outcomes Short Term: Continued assessment and intervention until BP is < 140/71mm HG in hypertensive participants. < 130/5mm HG in hypertensive participants with diabetes, heart failure or chronic kidney disease.;Long Term: Maintenance of blood pressure at goal levels.   Lipids Yes   Intervention Provide education and support for participant on nutrition & aerobic/resistive exercise along with prescribed medications to achieve LDL 70mg , HDL >40mg .   Expected Outcomes Short Term: Participant states understanding of desired cholesterol values and is compliant with medications prescribed. Participant is following exercise prescription and nutrition guidelines.;Long Term: Cholesterol controlled with medications as prescribed, with individualized exercise RX and with personalized nutrition plan. Value goals: LDL < 70mg , HDL > 40 mg.      Core Components/Risk Factors/Patient Goals Review:    Core Components/Risk Factors/Patient Goals at Discharge (Final Review):    ITP Comments:     ITP Comments    Row Name 02/18/16 0919 02/26/16 0706         ITP Comments Ms Gerken called and will miss [facility] today. Her big toe is swollen, and she has an  appointment with Dr Derrel Nip today. Ms Chismar called on 02/21/16 and will return to [place] after her appointment with Dr Vickki Muff  in 2 weeks.         Comments: 30 Day Review

## 2016-03-11 DIAGNOSIS — L97511 Non-pressure chronic ulcer of other part of right foot limited to breakdown of skin: Secondary | ICD-10-CM | POA: Diagnosis not present

## 2016-03-11 DIAGNOSIS — E1042 Type 1 diabetes mellitus with diabetic polyneuropathy: Secondary | ICD-10-CM | POA: Diagnosis not present

## 2016-03-12 ENCOUNTER — Ambulatory Visit: Payer: Medicare Other

## 2016-03-14 ENCOUNTER — Encounter: Payer: Self-pay | Admitting: Respiratory Therapy

## 2016-03-14 ENCOUNTER — Telehealth: Payer: Self-pay | Admitting: Respiratory Therapy

## 2016-03-14 ENCOUNTER — Encounter: Payer: Self-pay | Admitting: Internal Medicine

## 2016-03-14 ENCOUNTER — Ambulatory Visit: Payer: Medicare Other

## 2016-03-14 ENCOUNTER — Ambulatory Visit (INDEPENDENT_AMBULATORY_CARE_PROVIDER_SITE_OTHER): Payer: Medicare Other | Admitting: Internal Medicine

## 2016-03-14 VITALS — BP 118/70 | HR 69 | Temp 98.3°F | Resp 12 | Ht 67.0 in | Wt 188.0 lb

## 2016-03-14 DIAGNOSIS — G4733 Obstructive sleep apnea (adult) (pediatric): Secondary | ICD-10-CM

## 2016-03-14 DIAGNOSIS — I200 Unstable angina: Secondary | ICD-10-CM

## 2016-03-14 DIAGNOSIS — K760 Fatty (change of) liver, not elsewhere classified: Secondary | ICD-10-CM

## 2016-03-14 DIAGNOSIS — Z794 Long term (current) use of insulin: Secondary | ICD-10-CM

## 2016-03-14 DIAGNOSIS — E1121 Type 2 diabetes mellitus with diabetic nephropathy: Secondary | ICD-10-CM

## 2016-03-14 DIAGNOSIS — I2510 Atherosclerotic heart disease of native coronary artery without angina pectoris: Secondary | ICD-10-CM

## 2016-03-14 DIAGNOSIS — E1140 Type 2 diabetes mellitus with diabetic neuropathy, unspecified: Secondary | ICD-10-CM

## 2016-03-14 DIAGNOSIS — R059 Cough, unspecified: Secondary | ICD-10-CM

## 2016-03-14 DIAGNOSIS — E1165 Type 2 diabetes mellitus with hyperglycemia: Secondary | ICD-10-CM

## 2016-03-14 DIAGNOSIS — R05 Cough: Secondary | ICD-10-CM

## 2016-03-14 DIAGNOSIS — Z9989 Dependence on other enabling machines and devices: Secondary | ICD-10-CM

## 2016-03-14 DIAGNOSIS — R1033 Periumbilical pain: Secondary | ICD-10-CM

## 2016-03-14 DIAGNOSIS — IMO0002 Reserved for concepts with insufficient information to code with codable children: Secondary | ICD-10-CM

## 2016-03-14 LAB — COMPREHENSIVE METABOLIC PANEL
ALBUMIN: 3.8 g/dL (ref 3.5–5.2)
ALT: 12 U/L (ref 0–35)
AST: 16 U/L (ref 0–37)
Alkaline Phosphatase: 52 U/L (ref 39–117)
BUN: 22 mg/dL (ref 6–23)
CHLORIDE: 106 meq/L (ref 96–112)
CO2: 27 meq/L (ref 19–32)
CREATININE: 1.21 mg/dL — AB (ref 0.40–1.20)
Calcium: 10.3 mg/dL (ref 8.4–10.5)
GFR: 55.66 mL/min — ABNORMAL LOW (ref >60.00)
Glucose, Bld: 96 mg/dL (ref 70–99)
Potassium: 4 mEq/L (ref 3.5–5.1)
SODIUM: 141 meq/L (ref 135–145)
Total Bilirubin: 0.8 mg/dL (ref 0.2–1.2)
Total Protein: 7.4 g/dL (ref 6.0–8.3)

## 2016-03-14 LAB — LDL CHOLESTEROL, DIRECT: Direct LDL: 102 mg/dL

## 2016-03-14 LAB — HEMOGLOBIN A1C: HEMOGLOBIN A1C: 7.6 % — AB (ref 4.6–6.5)

## 2016-03-14 MED ORDER — GUAIFENESIN-CODEINE 100-10 MG/5ML PO SYRP: 5.0000 mL | ORAL_SOLUTION | Freq: Every evening | ORAL | 0 refills | Status: DC | PRN

## 2016-03-14 NOTE — Patient Instructions (Signed)
I recommend using the nebulizer before you go to bed, along with the cough syrup   Your referral is in process as discussed to Dr Bary Castilla. Our referral coordinator will call you when the appointment has been made.  If you do not hear from [facility] in our office in a week   Please call us back
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
## 2016-03-16 DIAGNOSIS — R1033 Periumbilical pain: Secondary | ICD-10-CM | POA: Insufficient documentation

## 2016-03-16 NOTE — Assessment & Plan Note (Deleted)
Currently treated with BIPAP

## 2016-03-16 NOTE — Assessment & Plan Note (Signed)
Significant improvement since last visit.  No changes today . Continue aspirin, statin and  ARB  Lab Results  Component Value Date   HGBA1C 7.6 (H) 03/14/2016     Lab Results  Component Value Date   MICROALBUR 28.9 (H) 09/26/2015

## 2016-03-16 NOTE — Assessment & Plan Note (Signed)
Trial of duoneb before bedtime.  Symbicort was d/c'd by Pulmonary recently.  May need to resume ,  Vs trial of PPI if no improvement.

## 2016-03-16 NOTE — Assessment & Plan Note (Signed)
Likely due to small ventral hernia from prior lap chole,  Aggravated by cough . Referral to [facility] for evaluation.  CT with contrast C/I secondary to contrast allergy. Cautioned to avoid digging at umbilicus with her long fingernails 9as evidenced today) due to increased risk of infection .

## 2016-03-17 ENCOUNTER — Encounter: Payer: Self-pay | Admitting: *Deleted

## 2016-03-17 ENCOUNTER — Ambulatory Visit: Payer: Medicare Other

## 2016-03-18 ENCOUNTER — Encounter: Payer: Self-pay | Admitting: Internal Medicine

## 2016-03-18 ENCOUNTER — Encounter: Payer: Self-pay | Admitting: *Deleted

## 2016-03-18 NOTE — Progress Notes (Signed)
Letter mailed with normal labs.

## 2016-03-19 ENCOUNTER — Ambulatory Visit: Payer: Medicare Other

## 2016-03-21 ENCOUNTER — Ambulatory Visit: Payer: Medicare Other

## 2016-03-22 IMAGING — CR DG CHEST 2V
1 series · 2 of 2 positions shown · non-contrast
Comparison: 11/15/2013

CLINICAL DATA: Followup pneumonia, congestive heart failure

EXAM:
CHEST  2 VIEW

[Series 1: w chest pa · 0.14mm/px · 2 of 2 slices shown]
[im 1/2]
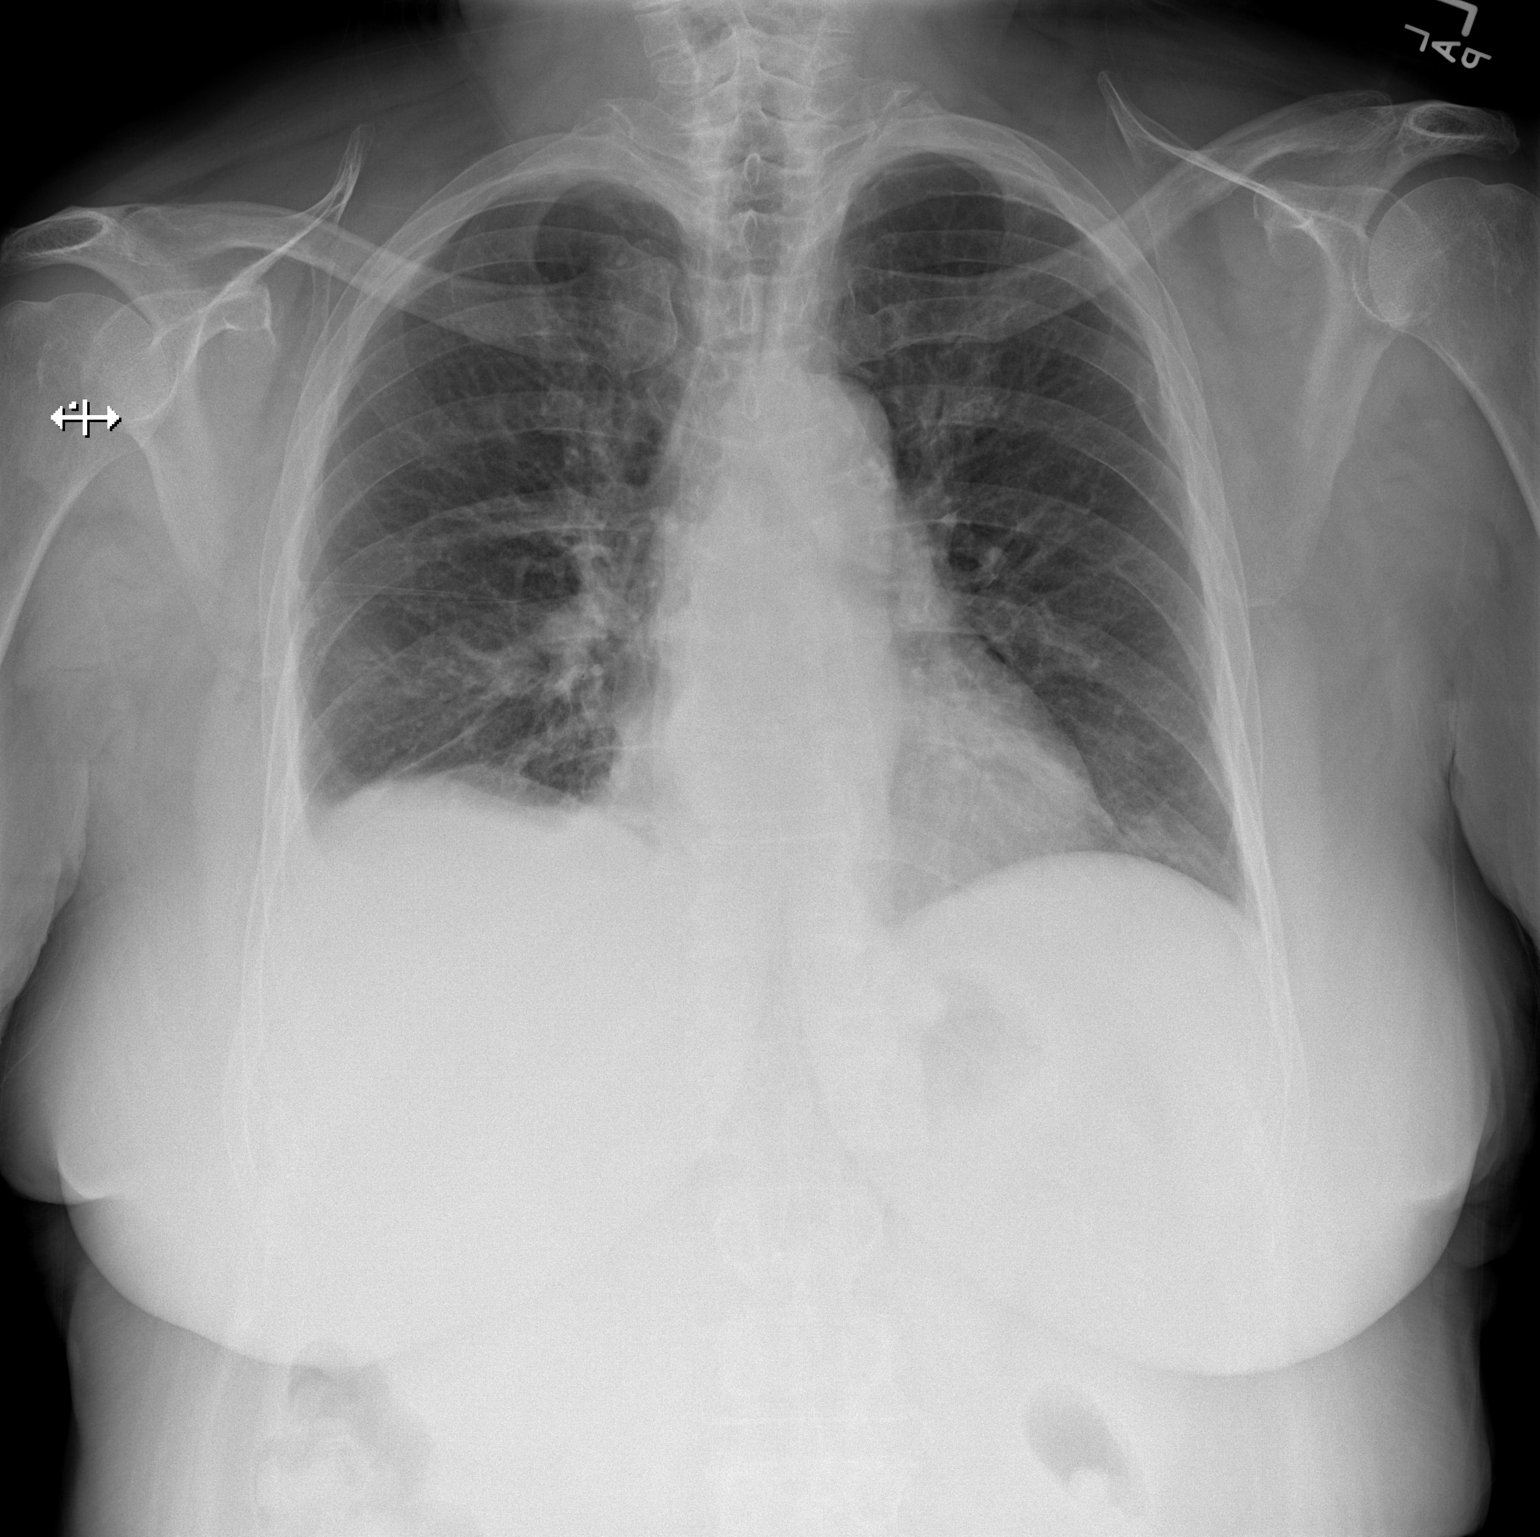
[im 2/2]
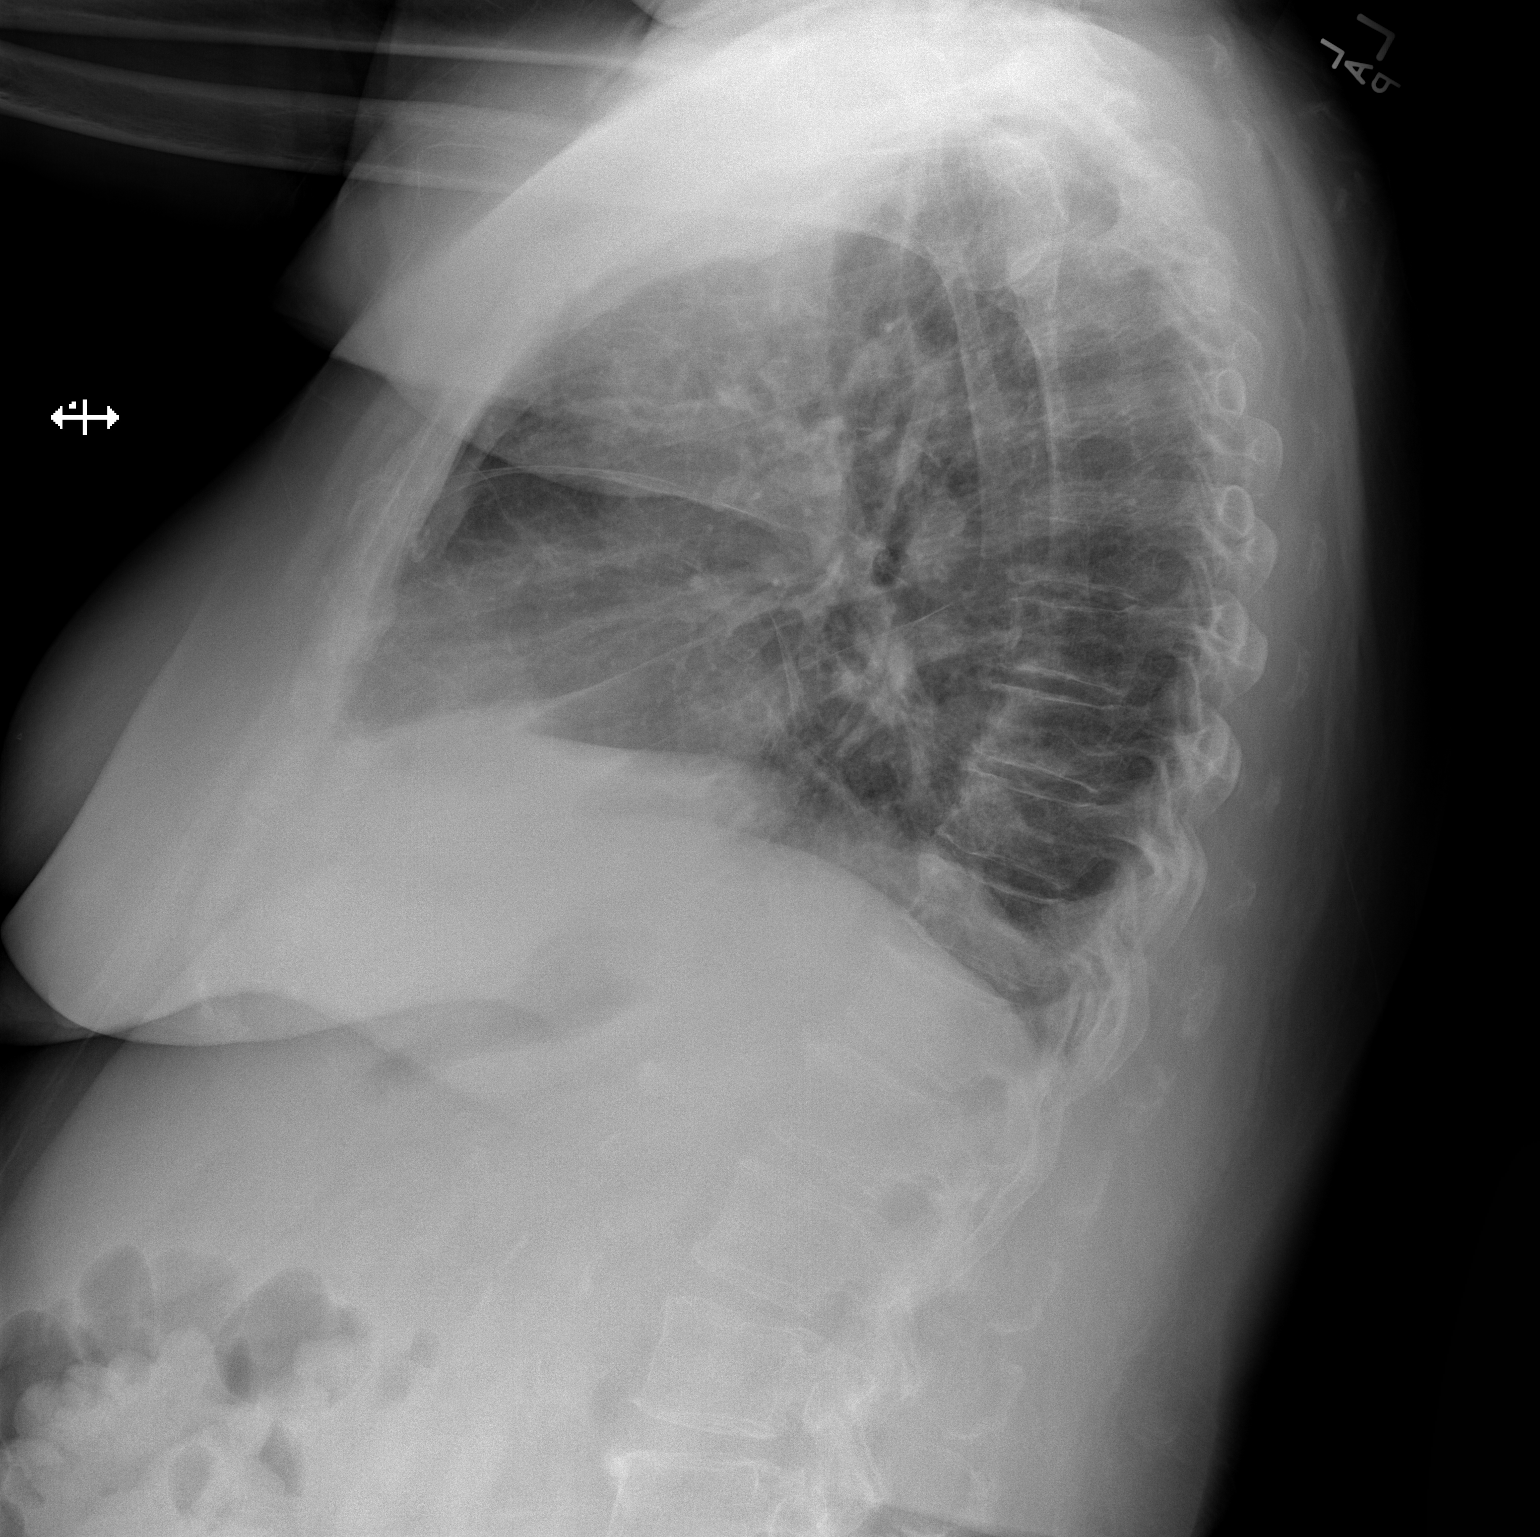

[2 of 2 positions shown; findings below may reference images not displayed]

FINDINGS: The heart size and vascular pattern are normal. Mild blunting right
costophrenic angle suggests tiny effusion. No persistent right lower
lobe consolidation. Old left rib fractures.
IMPRESSION: Tiny right pleural effusion.

## 2016-03-24 ENCOUNTER — Ambulatory Visit: Payer: Medicare Other

## 2016-03-25 ENCOUNTER — Ambulatory Visit
Admission: RE | Admit: 2016-03-25 | Discharge: 2016-03-25 | Disposition: A | Discharge status: Home / Self Care | Payer: Medicare Other | Source: Ambulatory Visit | Attending: Internal Medicine | Admitting: Internal Medicine

## 2016-03-25 DIAGNOSIS — Z1231 Encounter for screening mammogram for malignant neoplasm of breast: Secondary | ICD-10-CM | POA: Diagnosis not present

## 2016-03-25 DIAGNOSIS — L97512 Non-pressure chronic ulcer of other part of right foot with fat layer exposed: Secondary | ICD-10-CM | POA: Diagnosis not present

## 2016-03-26 ENCOUNTER — Ambulatory Visit: Payer: Medicare Other

## 2016-03-28 ENCOUNTER — Ambulatory Visit: Payer: Medicare Other

## 2016-03-31 ENCOUNTER — Ambulatory Visit: Payer: Medicare Other

## 2016-04-01 ENCOUNTER — Telehealth: Payer: Self-pay | Admitting: Respiratory Therapy

## 2016-04-01 ENCOUNTER — Encounter: Payer: Self-pay | Admitting: Respiratory Therapy

## 2016-04-01 ENCOUNTER — Ambulatory Visit (INDEPENDENT_AMBULATORY_CARE_PROVIDER_SITE_OTHER): Payer: Medicare Other | Admitting: General Surgery

## 2016-04-01 ENCOUNTER — Encounter: Payer: Self-pay | Admitting: General Surgery

## 2016-04-01 VITALS — BP 148/66 | HR 66 | Resp 16 | Ht 67.0 in | Wt 196.0 lb

## 2016-04-01 DIAGNOSIS — I200 Unstable angina: Secondary | ICD-10-CM

## 2016-04-01 DIAGNOSIS — G4733 Obstructive sleep apnea (adult) (pediatric): Secondary | ICD-10-CM

## 2016-04-01 DIAGNOSIS — K439 Ventral hernia without obstruction or gangrene: Secondary | ICD-10-CM | POA: Diagnosis not present

## 2016-04-01 NOTE — Telephone Encounter (Signed)
Mailed unread message to patient.  

## 2016-04-01 NOTE — Patient Instructions (Addendum)
The patient is aware to call back for any questions or concerns. Follow up as needed or if symptoms worsen Hernia, Adult A hernia is the bulging of an organ or tissue through a weak spot in the muscles of the abdomen (abdominal wall). Hernias develop most often near the navel or groin. There are many kinds of hernias. Common kinds include:  Femoral hernia. This kind of hernia develops under the groin in the upper thigh area.  Inguinal hernia. This kind of hernia develops in the groin or scrotum.  Umbilical hernia. This kind of hernia develops near the navel.  Hiatal hernia. This kind of hernia causes part of the stomach to be pushed up into the chest.  Incisional hernia. This kind of hernia bulges through a scar from an abdominal surgery. What are the causes? This condition may be caused by:  Heavy lifting.  Coughing over a long period of time.  Straining to have a bowel movement.  An incision made during an abdominal surgery.  A birth defect (congenital defect).  Excess weight or obesity.  Smoking.  Poor nutrition.  Cystic fibrosis.  Excess fluid in the abdomen.  Undescended testicles. What are the signs or symptoms? Symptoms of a hernia include:  A lump on the abdomen. This is the first sign of a hernia. The lump may become more obvious with standing, straining, or coughing. It may get bigger over time if it is not treated or if the condition causing it is not treated.  Pain. A hernia is usually painless, but it may become painful over time if treatment is delayed. The pain is usually dull and may get worse with standing or lifting heavy objects. Sometimes a hernia gets tightly squeezed in the weak spot (strangulated) or stuck there (incarcerated) and causes additional symptoms. These symptoms may include:  Vomiting.  Nausea.  Constipation.  Irritability. How is this diagnosed? A hernia may be diagnosed with:  A physical exam. During the exam your health  care provider may ask you to cough or to make a specific movement, because a hernia is usually more visible when you move.  Imaging tests. These can include:  X-rays.  Ultrasound.  CT scan. How is this treated? A hernia that is small and painless may not need to be treated. A hernia that is large or painful may be treated with surgery. Inguinal hernias may be treated with surgery to prevent incarceration or strangulation. Strangulated hernias are always treated with surgery, because lack of blood to the trapped organ or tissue can cause it to die. Surgery to treat a hernia involves pushing the bulge back into place and repairing the weak part of the abdomen. Follow these instructions at home:  Avoid straining.  Do not lift anything heavier than 10 lb (4.5 kg).  Lift with your leg muscles, not your back muscles. This helps avoid strain.  When coughing, try to cough gently.  Prevent constipation. Constipation leads to straining with bowel movements, which can make a hernia worse or cause a hernia repair to break down. You can prevent constipation by:  Eating a high-fiber diet that includes plenty of fruits and vegetables.  Drinking enough fluids to keep your urine clear or pale yellow. Aim to drink 6-8 glasses of water per day.  Using a stool softener as directed by your health care provider.  Lose weight, if you are overweight.  Do not use any tobacco products, including cigarettes, chewing tobacco, or electronic cigarettes. If you need help quitting, ask  your health care provider.  Keep all follow-up visits as directed by your health care provider. This is important. Your health care provider may need to monitor your condition. Contact a health care provider if:  You have swelling, redness, and pain in the affected area.  Your bowel habits change. Get help right away if:  You have a fever.  You have abdominal pain that is getting worse.  You feel nauseous or you  vomit.  You cannot push the hernia back in place by gently pressing on it while you are lying down.  The hernia:  Changes in shape or size.  Is stuck outside the abdomen.  Becomes discolored.  Feels hard or tender. This information is not intended to replace advice given to you by your health care provider. Make sure you discuss any questions you have with your health care provider. Document Released: 04/28/2005 Document Revised: 09/26/2015 Document Reviewed: 03/08/2014 Elsevier Interactive Patient Education  2017 [employer].

## 2016-04-01 NOTE — Progress Notes (Signed)
Patient ID: Pamela Collier, female   DOB: 05/21/1940, 75 y.o.   MRN: [id number]  Chief Complaint  Patient presents with  . Umbilical Hernia    HPI Pamela Collier is a 75 y.o. female here today for a evaluation of a possible umbilical hernia. Patient states she noticed this knot about 2 months ago. Minimal discomfort to the area. Bowels move regular every other day with mild constipation. She does admit to soreness lower abdomen and worse with coughing. She does use insulin and is using her abdomen for her injections. She is seeing Dr Vickki Muff for her right foot pain.    HPI  Past Medical History:  Diagnosis Date  . Acute respiratory failure ([place]) 11/21/2014  . Arthritis 08/05/2013  . Atherosclerotic peripheral vascular disease with gangrene [facility]) august 2012  . Cardiomyopathy, ischemic    a. EF 35 to 40% by echo in 2013 b. EF improved to 50-55% by echo in 04/2015.  [name] Cerebral infarct ([place]) 08/17/2013  . Chronic diastolic CHF (congestive heart failure) (H[facility])    a. EF 50-55% by echo in 04/2015  . COPD (chronic obstructive pulmonary disease) ([place])   . Coronary artery disease, occlusive    a. Previous PCI to the LAD, LCx, and RCA in 2010, 2011, 2013, and 2016. All performed in [place].  . 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 Diabetic diarrhea ([place]) 10/03/2014  . DM type 2, uncontrolled, with renal complications ([place]) 0/11/6224  . Hepatic steatosis    by CT abd pelvis  . Hyperlipidemia LDL goal <100 02/23/2014  . Hypertensive heart disease   . Osteoporosis, post-menopausal   . 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 Pleural effusion 10/25/2012   10/2012 CT chest >> small to moderate R lung effusion>> chylothorax, 100% lymphs 10/2013 thoracentesis> cytology negative, WBC 1471, > 90% "small lymphs" 01/2014 CT chest> near complete resolution of pleural effusion, stable lymphadenopathy 08/2014 CT chest [place]  ([facility]): small right sided effusion decreased in size, stable mediastinal lymphadenopathy 1.0cm largest, 28m  . 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 Renal insufficiency   . Sarcoidosis [facility])     Past Surgical History:  Procedure Laterality Date  . ABDOMINAL HYSTERECTOMY     at ge 39. secondary to bleeding/partial  . BREAST CYST ASPIRATION Right   . CARPAL TUNNEL RELEASE Bilateral   . CHOLECYSTECTOMY     in [place]   . COLONOSCOPY WITH PROPOFOL N/A 01/09/2015   Procedure: COLONOSCOPY WITH PROPOFOL;  Surgeon: Lucilla Lame, MD;  Location: [facility] ENDOSCOPY;  Service: Endoscopy;  Laterality: N/A;  . CORONARY ANGIOPLASTY WITH STENT PLACEMENT     [place]; [facility]  . HERNIA REPAIR     umbilical/ Dr Pat Patrick  . PTCA  August 2012   Right Posterior tibial artery , [place]  . TOE AMPUTATION  Sept 2012   Right 2nd toe, [place]    Family History  Problem Relation Age of Onset  . Heart disease Father   . Heart attack Father   . Heart disease Sister   . Heart attack Sister   . Heart disease Brother   .  Heart attack Brother   . Asthma Grandchild   . Breast cancer Maternal Aunt     Social History Social History  Substance Use Topics  . Smoking status: Never Smoker  . Smokeless tobacco: Never Used  . Alcohol use No    Allergies  Allergen Reactions  . Contrast Media [Iodinated Diagnostic Agents] Itching  . Gabapentin Itching  . Sulfa Antibiotics Itching    Current Outpatient Prescriptions  Medication Sig Dispense Refill  . acetaminophen (TYLENOL) 500 MG tablet Take 500 mg by mouth every 6 (six) hours as needed for mild pain or headache.    . albuterol (PROAIR HFA) 108 (90 Base) MCG/ACT inhaler Inhale 2 puffs  into the lungs every 6 (six) hours as needed for wheezing or shortness of breath. 1 Inhaler 2  . albuterol (PROVENTIL) (2.5 MG/3ML) 0.083% nebulizer solution Take 3 mLs (2.5 mg total) by nebulization every 6 (six) hours as needed for wheezing or shortness of breath. 150 mL 1  . alendronate (FOSAMAX) 70 MG tablet Take 70 mg by mouth once a week. Pt takes on Sunday.   Take with a full glass of water on an empty stomach.    . ALPRAZolam (XANAX) 0.5 MG tablet Take 0.5 mg by mouth daily.    [name] aspirin EC 81 MG tablet Take 81 mg by mouth daily.    . B-D UF III MINI PEN NEEDLES 31G X 5 MM MISC USE THREE TIMES A DAY 270 each 3  . benzonatate (TESSALON) 100 MG capsule Take 200 mg by mouth 3 (three) times daily as needed for cough. Reported on 11/09/2015    . BRILINTA 90 MG TABS tablet Take 1 tablet (90 mg total) by mouth 2 (two) times daily. 180 tablet 2  . budesonide-formoterol (SYMBICORT) 160-4.5 MCG/ACT inhaler Inhale 2 puffs into the lungs 2 (two) times daily. 1 Inhaler 0  . docusate sodium (COLACE) 100 MG capsule Take 100 mg by mouth 2 (two) times daily as needed for mild constipation. Reported on 11/09/2015    . fluticasone (FLONASE) 50 MCG/ACT nasal spray Place 2 sprays into both nostrils daily as needed for rhinitis.    . furosemide (LASIX) 20 MG tablet Take 1 tablet (20 mg total) by mouth daily. 90 tablet 1  . guaiFENesin-codeine (CHERATUSSIN AC) 100-10 MG/5ML syrup Take 5 mLs by mouth at bedtime as needed for cough. 120 mL 0  . hydrOXYzine (ATARAX/VISTARIL) 10 MG tablet Take 1 tablet (10 mg total) by mouth 3 (three) times daily as needed. 30 tablet 0  . Insulin Lispro Prot & Lispro (HUMALOG MIX 75/25 KWIKPEN) (75-25) 100 UNIT/ML Kwikpen Inject 45 to 60 units twice daily before morning and evening meals (Patient taking differently: Inject 44-60 Units into the skin. Inject 45 to 60 units twice daily before morning and evening meals) 112 mL 3  . Ipratropium-Albuterol (COMBIVENT RESPIMAT) 20-100 MCG/ACT  AERS respimat Inhale 1 puff into the lungs 3 (three) times daily as needed for wheezing or shortness of breath. Reported on 10/25/2015    . isosorbide mononitrate (IMDUR) 30 MG 24 hr tablet TAKE 1 TABLET DAILY 90 tablet 1  . losartan (COZAAR) 100 MG tablet TAKE 1 TABLET DAILY 90 tablet 1  . metoprolol tartrate (LOPRESSOR) 25 MG tablet Take 0.5 tablets (12.5 mg total) by mouth 2 (two) times daily.    . nitroGLYCERIN (NITROSTAT) 0.4 MG SL tablet Place 1 tablet (0.4 mg total) under the tongue every 5 (five) minutes as needed for chest pain. 25 tablet 3  .  ondansetron (ZOFRAN) 8 MG tablet Take 1 tablet (8 mg total) by mouth every 8 (eight) hours as needed for nausea or vomiting. 15 tablet 0  . PARoxetine (PAXIL) 20 MG tablet TAKE ONE TABLET BY MOUTH ONCE DAILY 30 tablet 1  . polyethylene glycol (MIRALAX / GLYCOLAX) packet Take 17 g by mouth daily as needed for mild constipation. Reported on 10/25/2015    . pregabalin (LYRICA) 100 MG capsule Take 1 capsule (100 mg total) by mouth 3 (three) times daily. 270 capsule 2  . rosuvastatin (CRESTOR) 20 MG tablet Take 20 mg by mouth at bedtime.    . traMADol (ULTRAM) 50 MG tablet Take 50 mg by mouth every 6 (six) hours as needed for moderate pain.    . Vitamin D, Ergocalciferol, (DRISDOL) 50000 units CAPS capsule Take 50,000 Units by mouth every 7 (seven) days. Pt takes on Sunday.     No current facility-administered medications for this visit.    Facility-Administered Medications Ordered in Other Visits  Medication Dose Route Frequency Provider Last Rate Last Dose  . albuterol (PROVENTIL) (5 MG/ML) 0.5% nebulizer solution 2.5 mg  2.5 mg Nebulization Once [name], MD        Review of Systems Review of Systems  Constitutional: Negative.   Respiratory: Negative.   Cardiovascular: Negative.   Gastrointestinal: Positive for constipation.    Blood pressure (!) 148/66, pulse 66, resp. rate 16, height 5\' 7"  (1.702 m), weight 196 lb (88.9 kg), SpO2 98  %.  Physical Exam Physical Exam  Constitutional: She is oriented to person, place, and time. She appears well-developed and well-nourished.  Eyes: Conjunctivae are normal. No scleral icterus.  Neck: Neck supple.  Cardiovascular: Normal rate, regular rhythm and normal heart sounds.   Pulmonary/Chest: Effort normal and breath sounds normal.  Abdominal: Soft. Normal appearance and bowel sounds are normal. There is tenderness. A hernia is present.    Hypogastric tenderness. Small epigastric hernia.  Lymphadenopathy:    She has no cervical adenopathy.  Neurological: She is alert and oriented to person, place, and time.  Skin: Skin is warm and dry.  Lipoma 6 cm just to right of umbilicus and 4 cm up  Psychiatric: Her behavior is normal.    Data Reviewed Colonoscopy completed 01/09/2015 showed pedunculated polyps in the sigmoid colon. Pathology showed 2, 5 mm tubular adenomas. Random biopsies were negative for colitis.  Assessment    Epigastric hernia, minimally symptomatic.  Multiple medical comorbidities.    Plan    The patient's asymptomatic from this epigastric hernia except for some mild tenderness with direct palpation. I would defer intervention unless the area enlarges or becomes painful.     Follow up as needed or if symptoms worsen.  This information has been scribed by Karie Fetch RN, BSN,BC.   Pamela Collier 04/01/2016, 8:49 PM

## 2016-04-02 ENCOUNTER — Ambulatory Visit: Payer: Medicare Other

## 2016-04-04 ENCOUNTER — Ambulatory Visit: Payer: Medicare Other

## 2016-04-07 ENCOUNTER — Ambulatory Visit: Payer: Medicare Other

## 2016-04-07 ENCOUNTER — Encounter: Payer: Self-pay | Admitting: *Deleted

## 2016-04-07 DIAGNOSIS — G4733 Obstructive sleep apnea (adult) (pediatric): Secondary | ICD-10-CM

## 2016-04-07 NOTE — Progress Notes (Signed)
Pulmonary Individual Treatment Plan  Patient Details  [name] MRN: [id number] Date of Birth: November 24, 1940 Referring Provider:   Flowsheet Row Pulmonary Rehab from 02/12/2016 in [facility] Cardiac and Pulmonary Rehab  Referring Provider  Tullo      Initial Encounter Date:  Flowsheet Row Pulmonary Rehab from 02/12/2016 in [facility] Cardiac and Pulmonary Rehab  Date  02/12/16  Referring Provider  Derrel Nip      Visit Diagnosis: Obstructive sleep apnea syndrome  Patient's Home Medications on Admission:  Current Outpatient Prescriptions:  .  acetaminophen (TYLENOL) 500 MG tablet, Take 500 mg by mouth every 6 (six) hours as needed for mild pain or headache., Disp: , Rfl:  .  albuterol (PROAIR HFA) 108 (90 Base) MCG/ACT inhaler, Inhale 2 puffs into the lungs every 6 (six) hours as needed for wheezing or shortness of breath., Disp: 1 Inhaler, Rfl: 2 .  albuterol (PROVENTIL) (2.5 MG/3ML) 0.083% nebulizer solution, Take 3 mLs (2.5 mg total) by nebulization every 6 (six) hours as needed for wheezing or shortness of breath., Disp: 150 mL, Rfl: 1 .  alendronate (FOSAMAX) 70 MG tablet, Take 70 mg by mouth once a week. Pt takes on Sunday.   Take with a full glass of water on an empty stomach., Disp: , Rfl:  .  ALPRAZolam (XANAX) 0.5 MG tablet, Take 0.5 mg by mouth daily., Disp: , Rfl:  .  aspirin EC 81 MG tablet, Take 81 mg by mouth daily., Disp: , Rfl:  .  B-D UF III MINI PEN NEEDLES 31G X 5 MM MISC, USE THREE TIMES A DAY, Disp: 270 each, Rfl: 3 .  benzonatate (TESSALON) 100 MG capsule, Take 200 mg by mouth 3 (three) times daily as needed for cough. Reported on 11/09/2015, Disp: , Rfl:  .  BRILINTA 90 MG TABS tablet, Take 1 tablet (90 mg total) by mouth 2 (two) times daily., Disp: 180 tablet, Rfl: 2 .  budesonide-formoterol (SYMBICORT) 160-4.5 MCG/ACT inhaler, Inhale 2 puffs into the lungs 2 (two) times daily., Disp: 1 Inhaler, Rfl: 0 .  docusate sodium (COLACE) 100 MG capsule, Take 100 mg by mouth 2  (two) times daily as needed for mild constipation. Reported on 11/09/2015, Disp: , Rfl:  .  fluticasone (FLONASE) 50 MCG/ACT nasal spray, Place 2 sprays into both nostrils daily as needed for rhinitis., Disp: , Rfl:  .  furosemide (LASIX) 20 MG tablet, Take 1 tablet (20 mg total) by mouth daily., Disp: 90 tablet, Rfl: 1 .  guaiFENesin-codeine (CHERATUSSIN AC) 100-10 MG/5ML syrup, Take 5 mLs by mouth at bedtime as needed for cough., Disp: 120 mL, Rfl: 0 .  hydrOXYzine (ATARAX/VISTARIL) 10 MG tablet, Take 1 tablet (10 mg total) by mouth 3 (three) times daily as needed., Disp: 30 tablet, Rfl: 0 .  Insulin Lispro Prot & Lispro (HUMALOG MIX 75/25 KWIKPEN) (75-25) 100 UNIT/ML Kwikpen, Inject 45 to 60 units twice daily before morning and evening meals (Patient taking differently: Inject 44-60 Units into the skin. Inject 45 to 60 units twice daily before morning and evening meals), Disp: 112 mL, Rfl: 3 .  Ipratropium-Albuterol (COMBIVENT RESPIMAT) 20-100 MCG/ACT AERS respimat, Inhale 1 puff into the lungs 3 (three) times daily as needed for wheezing or shortness of breath. Reported on 10/25/2015, Disp: , Rfl:  .  isosorbide mononitrate (IMDUR) 30 MG 24 hr tablet, TAKE 1 TABLET DAILY, Disp: 90 tablet, Rfl: 1 .  losartan (COZAAR) 100 MG tablet, TAKE 1 TABLET DAILY, Disp: 90 tablet, Rfl: 1 .  metoprolol  tartrate (LOPRESSOR) 25 MG tablet, Take 0.5 tablets (12.5 mg total) by mouth 2 (two) times daily., Disp: , Rfl:  .  nitroGLYCERIN (NITROSTAT) 0.4 MG SL tablet, Place 1 tablet (0.4 mg total) under the tongue every 5 (five) minutes as needed for chest pain., Disp: 25 tablet, Rfl: 3 .  ondansetron (ZOFRAN) 8 MG tablet, Take 1 tablet (8 mg total) by mouth every 8 (eight) hours as needed for nausea or vomiting., Disp: 15 tablet, Rfl: 0 .  PARoxetine (PAXIL) 20 MG tablet, TAKE ONE TABLET BY MOUTH ONCE DAILY, Disp: 30 tablet, Rfl: 1 .  polyethylene glycol (MIRALAX / GLYCOLAX) packet, Take 17 g by mouth daily as needed for  mild constipation. Reported on 10/25/2015, Disp: , Rfl:  .  pregabalin (LYRICA) 100 MG capsule, Take 1 capsule (100 mg total) by mouth 3 (three) times daily., Disp: 270 capsule, Rfl: 2 .  rosuvastatin (CRESTOR) 20 MG tablet, Take 20 mg by mouth at bedtime., Disp: , Rfl:  .  traMADol (ULTRAM) 50 MG tablet, Take 50 mg by mouth every 6 (six) hours as needed for moderate pain., Disp: , Rfl:  .  Vitamin D, Ergocalciferol, (DRISDOL) 50000 units CAPS capsule, Take 50,000 Units by mouth every 7 (seven) days. Pt takes on Sunday., Disp: , Rfl:  No current facility-administered medications for this visit.   Facility-Administered Medications Ordered in Other Visits:  .  albuterol (PROVENTIL) (5 MG/ML) 0.5% nebulizer solution 2.5 mg, 2.5 mg, Nebulization, Once, [name], MD  Past Medical History: Past Medical History:  Diagnosis Date  . Acute respiratory failure ([place]) 11/21/2014  . Arthritis 08/05/2013  . Atherosclerotic peripheral vascular disease with gangrene [facility]) august 2012  . Cardiomyopathy, ischemic    a. EF 35 to 40% by echo in 2013 b. EF improved to 50-55% by echo in 04/2015.  [name] Cerebral infarct ([place]) 08/17/2013  . Chronic diastolic CHF (congestive heart failure) (HCC)    a. EF 50-55% by echo in 04/2015  . COPD (chronic obstructive pulmonary disease) ([place])   . Coronary artery disease, occlusive    a. Previous PCI to the LAD, LCx, and RCA in 2010, 2011, 2013, and 2016. All performed in [place].  . 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 Diabetic diarrhea ([place]) 10/03/2014  . DM type 2, uncontrolled, with renal complications ([place]) 06/15/5359  . Hepatic steatosis    by CT abd pelvis  . Hyperlipidemia LDL goal <100 02/23/2014  . Hypertensive heart disease   . Osteoporosis, post-menopausal   . 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 Pleural effusion 10/25/2012   10/2012 CT chest >> small to moderate R lung effusion>>  chylothorax, 100% lymphs 10/2013 thoracentesis> cytology negative, WBC 1471, > 90% "small lymphs" 01/2014 CT chest> near complete resolution of pleural effusion, stable lymphadenopathy 08/2014 CT chest [place] ([facility]): small right sided effusion decreased in size, stable mediastinal lymphadenopathy 1.0cm largest, 81m  . 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 Renal insufficiency   . Sarcoidosis ([place])     Tobacco Use: History  Smoking Status  . Never Smoker  Smokeless Tobacco  . Never Used    Labs:  Recent Review Flowsheet Data    Labs for ITP Cardiac and Pulmonary Rehab Latest Ref Rng & Units 09/26/2015 11/06/2015 12/13/2015 01/02/2016 03/14/2016   Cholestrol 0 - 200 mg/dL - 135 - 123 -   LDLCALC 0 - 99 mg/dL - 74 - 64 -   LDLDIRECT mg/dL 73.0 - - - 102.0   HDL >40 mg/dL - 31.90(L) - 29(L) -   Trlycerides <150 mg/dL - 143.0 - 151(H) -   Hemoglobin A1c 4.6 - 6.5 % 9.6(H) - 7.7 - 7.6(H)   HCO3 21.0 - 28.0 mEq/L - - - - -       ADL UCSD:     Pulmonary Assessment Scores    Row Name 02/12/16 1239         ADL UCSD   ADL Phase Entry     SOB Score total 44     Rest 0     Walk 3     Stairs 4     Bath 0     Dress 0     Shop 4        Pulmonary Function Assessment:   Exercise Target Goals:    Exercise Program Goal: Individual exercise prescription set with THRR, safety & activity barriers. Participant demonstrates ability to understand and report RPE using BORG scale, to self-measure pulse accurately, and to acknowledge the importance of the exercise prescription.  Exercise Prescription Goal: Starting with aerobic activity 30 plus minutes a day, 3 days per week for initial exercise prescription. Provide home exercise  prescription and guidelines that participant acknowledges understanding prior to discharge.  Activity Barriers & Risk Stratification:     Activity Barriers & Cardiac Risk Stratification - 02/12/16 1237      Activity Barriers & Cardiac Risk Stratification   Activity Barriers Shortness of Breath;Deconditioning   Cardiac Risk Stratification Moderate      6 Minute Walk:     6 Minute Walk    Row Name 02/12/16 1152         6 Minute Walk   Distance 885 feet     Walk Time 5.49 minutes     # of Rest Breaks 2     MPH 1.83     METS 2.38     RPE 13     Perceived Dyspnea  5     VO2 Peak 5.66     Symptoms Yes (comment)     Comments Shortness of breath - stopped to rest then continued     Resting HR 60 bpm     Resting BP 136/62     Max Ex. HR 77 bpm     Max Ex. BP 148/58       Interval HR   Baseline HR 60     1 Minute HR 64     2 Minute HR 66     3 Minute HR 67     4 Minute HR 75     5 Minute HR 76     6 Minute HR 77     2 Minute Post HR 73     Interval Heart Rate? Yes       Interval Oxygen   Interval Oxygen? Yes     Baseline Oxygen Saturation % 98 %     1 Minute Oxygen Saturation % 95 %     2 Minute Oxygen Saturation % 93 %     3 Minute Oxygen Saturation % 91 %     4 Minute Oxygen Saturation %  95 %     5 Minute Oxygen Saturation % 94 %     6 Minute Oxygen Saturation % 96 %     2 Minute Post Oxygen Saturation % 99 %        Initial Exercise Prescription:     Initial Exercise Prescription - 02/12/16 1200      Date of Initial Exercise RX and Referring Provider   Date 02/12/16   Referring Provider Tullo     Treadmill   MPH 1.5   Grade 0   Minutes 15  5/5/5   METs 2.15     NuStep   Level 2   Minutes 15   METs 2     Biostep-RELP   Level 2   Minutes 15   METs 2     Prescription Details   Frequency (times per week) 3   Duration Progress to 45 minutes of aerobic exercise without signs/symptoms of physical distress     Intensity   THRR 40-80% of Max  Heartrate 94-128   Ratings of Perceived Exertion 11-13   Perceived Dyspnea 0-4     Progression   Progression Continue to progress workloads to maintain intensity without signs/symptoms of physical distress.     Resistance Training   Training Prescription Yes   Weight 2   Reps 10-12      Perform Capillary Blood Glucose checks as needed.  Exercise Prescription Changes:   Exercise Comments:   Discharge Exercise Prescription (Final Exercise Prescription Changes):    Nutrition:  Target Goals: Understanding of nutrition guidelines, daily intake of sodium 1500mg , cholesterol 200mg , calories 30% from fat and 7% or less from saturated fats, daily to have 5 or more servings of fruits and vegetables.  Biometrics:     Pre Biometrics - 02/12/16 1152      Pre Biometrics   Height 5\' 7"  (1.702 m)   Weight 193 lb (87.5 kg)   Waist Circumference 45 inches   Hip Circumference 39 inches   Waist to Hip Ratio 1.15 %   BMI (Calculated) 30.3       Nutrition Therapy Plan and Nutrition Goals:   Nutrition Discharge: Rate Your Plate Scores:   Psychosocial: Target Goals: Acknowledge presence or absence of depression, maximize coping skills, provide positive support system. Participant is able to verbalize types and ability to use techniques and skills needed for reducing stress and depression.  Initial Review & Psychosocial Screening:     Initial Psych Review & Screening - 02/12/16 1248      Family Dynamics   Comments Ms Rita has very little support. Her children live in [place] and her husband has medical problems himself. She enjoys socialization, being with her friens, and will enjpy being in the exercise group.     Barriers   Psychosocial barriers to participate in program The patient should benefit from training in stress management and relaxation.     Screening Interventions   Interventions Encouraged to exercise;Program counselor consult      Quality of Life  Scores:     Quality of Life - 02/12/16 1250      Quality of Life Scores   Health/Function Pre 21 %   Socioeconomic Pre 21 %   Psych/Spiritual Pre 29.57 %   Family Pre 21 %   GLOBAL Pre 22.94 %      PHQ-9: Recent Review Flowsheet Data    Depression screen [facility] 2/9 02/12/2016 12/03/2015 10/10/2015 10/30/2014 10/30/2014   Decreased Interest 1 0 1 0  0   Down, Depressed, Hopeless 2 0 0 0 0   PHQ - 2 Score 3 0 1 0 0   Altered sleeping 0 - - - -   Tired, decreased energy 2 - - - -   Change in appetite 1 - - - -   Feeling bad or failure about yourself  0 - - - -   Trouble concentrating 0 - - - -   Moving slowly or fidgety/restless 1 - - - -   Suicidal thoughts 0 - - - -   PHQ-9 Score 7 - - - -   Difficult doing work/chores Somewhat difficult - - - -      Psychosocial Evaluation and Intervention:   Psychosocial Re-Evaluation:  Education: Education Goals: Education classes will be provided on a weekly basis, covering required topics. Participant will state understanding/return demonstration of topics presented.  Learning Barriers/Preferences:     Learning Barriers/Preferences - 02/12/16 1238      Learning Barriers/Preferences   Learning Barriers Reading   Learning Preferences None      Education Topics: Initial Evaluation Education: - Verbal, written and demonstration of respiratory meds, RPE/PD scales, oximetry and breathing techniques. Instruction on use of nebulizers and MDIs: cleaning and proper use, rinsing mouth with steroid doses and importance of monitoring MDI activations. Flowsheet Row Pulmonary Rehab from 02/12/2016 in [facility] Cardiac and Pulmonary Rehab  Date  02/12/16  Educator  LB  Instruction Review Code  2- meets goals/outcomes      General Nutrition Guidelines/Fats and Fiber: -Group instruction provided by verbal, written material, models and posters to present the general guidelines for heart healthy nutrition. Gives an explanation and review of dietary  fats and fiber.   Controlling Sodium/Reading Food Labels: -Group verbal and written material supporting the discussion of sodium use in heart healthy nutrition. Review and explanation with models, verbal and written materials for utilization of the food label.   Exercise Physiology & Risk Factors: - Group verbal and written instruction with models to review the exercise physiology of the cardiovascular system and associated critical values. Details cardiovascular disease risk factors and the goals associated with each risk factor.   Aerobic Exercise & Resistance Training: - Gives group verbal and written discussion on the health impact of inactivity. On the components of aerobic and resistive training programs and the benefits of this training and how to safely progress through these programs.   Flexibility, Balance, General Exercise Guidelines: - Provides group verbal and written instruction on the benefits of flexibility and balance training programs. Provides general exercise guidelines with specific guidelines to those with heart or lung disease. Demonstration and skill practice provided.   Stress Management: - Provides group verbal and written instruction about the health risks of elevated stress, cause of high stress, and healthy ways to reduce stress.   Depression: - Provides group verbal and written instruction on the correlation between heart/lung disease and depressed mood, treatment options, and the stigmas associated with seeking treatment.   Exercise & Equipment Safety: - Individual verbal instruction and demonstration of equipment use and safety with use of the equipment.   Infection Prevention: - Provides verbal and written material to individual with discussion of infection control including proper hand washing and proper equipment cleaning during exercise session. Flowsheet Row Pulmonary Rehab from 02/12/2016 in [facility] Cardiac and Pulmonary Rehab  Date  02/12/16   Educator  LB  Instruction Review Code  2- meets goals/outcomes      Falls Prevention: - Provides  verbal and written material to individual with discussion of falls prevention and safety. Flowsheet Row Pulmonary Rehab from 02/12/2016 in [facility] Cardiac and Pulmonary Rehab  Date  02/12/16  Educator  LB  Instruction Review Code  2- meets goals/outcomes      Diabetes: - Individual verbal and written instruction to review signs/symptoms of diabetes, desired ranges of glucose level fasting, after meals and with exercise. Advice that pre and post exercise glucose checks will be done for 3 sessions at entry of program.   Chronic Lung Diseases: - Group verbal and written instruction to review new updates, new respiratory medications, new advancements in procedures and treatments. Provide informative websites and "800" numbers of self-education.   Lung Procedures: - Group verbal and written instruction to describe testing methods done to diagnose lung disease. Review the outcome of test results. Describe the treatment choices: Pulmonary Function Tests, ABGs and oximetry.   Energy Conservation: - Provide group verbal and written instruction for methods to conserve energy, plan and organize activities. Instruct on pacing techniques, use of adaptive equipment and posture/positioning to relieve shortness of breath.   Triggers: - Group verbal and written instruction to review types of environmental controls: home humidity, furnaces, filters, dust mite/pet prevention, HEPA vacuums. To discuss weather changes, air quality and the benefits of nasal washing.   Exacerbations: - Group verbal and written instruction to provide: warning signs, infection symptoms, calling MD promptly, preventive modes, and value of vaccinations. Review: effective airway clearance, coughing and/or vibration techniques. Create an Sports administrator.   Oxygen: - Individual and group verbal and written instruction on oxygen  therapy. Includes supplement oxygen, available portable oxygen systems, continuous and intermittent flow rates, oxygen safety, concentrators, and Medicare reimbursement for oxygen.   Respiratory Medications: - Group verbal and written instruction to review medications for lung disease. Drug class, frequency, complications, importance of spacers, rinsing mouth after steroid MDI's, and proper cleaning methods for nebulizers. Flowsheet Row Pulmonary Rehab from 02/12/2016 in [facility] Cardiac and Pulmonary Rehab  Date  02/12/16  Educator  LB  Instruction Review Code  2- meets goals/outcomes      AED/CPR: - Group verbal and written instruction with the use of models to demonstrate the basic use of the AED with the basic ABC's of resuscitation.   Breathing Retraining: - Provides individuals verbal and written instruction on purpose, frequency, and proper technique of diaphragmatic breathing and pursed-lipped breathing. Applies individual practice skills. Flowsheet Row Pulmonary Rehab from 02/12/2016 in [facility] Cardiac and Pulmonary Rehab  Date  02/12/16  Educator  LB  Instruction Review Code  2- meets goals/outcomes      Anatomy and Physiology of the Lungs: - Group verbal and written instruction with the use of models to provide basic lung anatomy and physiology related to function, structure and complications of lung disease.   Heart Failure: - Group verbal and written instruction on the basics of heart failure: signs/symptoms, treatments, explanation of ejection fraction, enlarged heart and cardiomyopathy.   Sleep Apnea: - Individual verbal and written instruction to review Obstructive Sleep Apnea. Review of risk factors, methods for diagnosing and types of masks and machines for OSA.   Anxiety: - Provides group, verbal and written instruction on the correlation between heart/lung disease and anxiety, treatment options, and management of anxiety.   Relaxation: - Provides group, verbal and  written instruction about the benefits of relaxation for patients with heart/lung disease. Also provides patients with examples of relaxation techniques.   Knowledge Questionnaire Score:  Knowledge Questionnaire Score - 02/12/16 1238      Knowledge Questionnaire Score   Pre Score 10/10       Core Components/Risk Factors/Patient Goals at Admission:     Personal Goals and Risk Factors at Admission - 02/12/16 1244      Core Components/Risk Factors/Patient Goals on Admission   Sedentary Yes   Intervention Provide advice, education, support and counseling about physical activity/exercise needs.;Develop an individualized exercise prescription for aerobic and resistive training based on initial evaluation findings, risk stratification, comorbidities and participant's personal goals.   Expected Outcomes Achievement of increased cardiorespiratory fitness and enhanced flexibility, muscular endurance and strength shown through measurements of functional capacity and personal statement of participant.   Increase Strength and Stamina Yes   Intervention Provide advice, education, support and counseling about physical activity/exercise needs.;Develop an individualized exercise prescription for aerobic and resistive training based on initial evaluation findings, risk stratification, comorbidities and participant's personal goals.   Expected Outcomes Achievement of increased cardiorespiratory fitness and enhanced flexibility, muscular endurance and strength shown through measurements of functional capacity and personal statement of participant.   Improve shortness of breath with ADL's Yes   Intervention Provide education, individualized exercise plan and daily activity instruction to help decrease symptoms of SOB with activities of daily living.   Expected Outcomes Short Term: Achieves a reduction of symptoms when performing activities of daily living.   Develop more efficient breathing techniques such  as purse lipped breathing and diaphragmatic breathing; and practicing self-pacing with activity Yes   Intervention Provide education, demonstration and support about specific breathing techniuqes utilized for more efficient breathing. Include techniques such as pursed lipped breathing, diaphragmatic breathing and self-pacing activity.   Expected Outcomes Short Term: Participant will be able to demonstrate and use breathing techniques as needed throughout daily activities.   Increase knowledge of respiratory medications and ability to use respiratory devices properly  Yes   Intervention Provide education and demonstration as needed of appropriate use of medications, inhalers, and oxygen therapy.  Symbicort and Albuterol MDI; spacer given; SVN Albuterol   Expected Outcomes Short Term: Achieves understanding of medications use. Understands that oxygen is a medication prescribed by physician. Demonstrates appropriate use of inhaler and oxygen therapy.   Diabetes Yes   Intervention Provide education about signs/symptoms and action to take for hypo/hyperglycemia.;Provide education about proper nutrition, including hydration, and aerobic/resistive exercise prescription along with prescribed medications to achieve blood glucose in normal ranges: Fasting glucose 65-99 mg/dL   Expected Outcomes Short Term: Participant verbalizes understanding of the signs/symptoms and immediate care of hyper/hypoglycemia, proper foot care and importance of medication, aerobic/resistive exercise and nutrition plan for blood glucose control.;Long Term: Attainment of HbA1C < 7%.   Heart Failure Yes   Intervention Provide a combined exercise and nutrition program that is supplemented with education, support and counseling about heart failure. Directed toward relieving symptoms such as shortness of breath, decreased exercise tolerance, and extremity edema.   Expected Outcomes Improve functional capacity of life;Short term: Attendance  in program 2-3 days a week with increased exercise capacity. Reported lower sodium intake. Reported increased fruit and vegetable intake. Reports medication compliance.;Short term: Daily weights obtained and reported for increase. Utilizing diuretic protocols set by physician.;Long term: Adoption of self-care skills and reduction of barriers for early signs and symptoms recognition and intervention leading to self-care maintenance.   Hypertension Yes   Intervention Provide education on lifestyle modifcations including regular physical activity/exercise, weight management, moderate sodium restriction and increased consumption of fresh fruit,  vegetables, and low fat dairy, alcohol moderation, and smoking cessation.;Monitor prescription use compliance.   Expected Outcomes Short Term: Continued assessment and intervention until BP is < 140/4mm HG in hypertensive participants. < 130/18mm HG in hypertensive participants with diabetes, heart failure or chronic kidney disease.;Long Term: Maintenance of blood pressure at goal levels.   Lipids Yes   Intervention Provide education and support for participant on nutrition & aerobic/resistive exercise along with prescribed medications to achieve LDL 70mg , HDL >40mg .   Expected Outcomes Short Term: Participant states understanding of desired cholesterol values and is compliant with medications prescribed. Participant is following exercise prescription and nutrition guidelines.;Long Term: Cholesterol controlled with medications as prescribed, with individualized exercise RX and with personalized nutrition plan. Value goals: LDL < 70mg , HDL > 40 mg.      Core Components/Risk Factors/Patient Goals Review:    Core Components/Risk Factors/Patient Goals at Discharge (Final Review):    ITP Comments:     ITP Comments    Row Name 02/18/16 0919 02/26/16 0706 03/14/16 0937 04/01/16 1515     ITP Comments Ms Swanton called and will miss [facility] today. Her big toe is  swollen, and she has an appointment with Dr Derrel Nip today. Ms Labine called on 02/21/16 and will return to [place] after her appointment with Dr Vickki Muff  in 2 weeks. Ms Gaetz called to inform us that she will not be started [facility] until after her appointment with Dr Vickki Muff. Her right toe ulcer may have to be xrayed. Called Ms Jungwirth. She had an xray of her right toe and had no infection. Her follow-up appointment with the physician is on 04/08/16,  and she will check on clearance for [facility].       Comments: 30 Day Review

## 2016-04-08 DIAGNOSIS — L97512 Non-pressure chronic ulcer of other part of right foot with fat layer exposed: Secondary | ICD-10-CM | POA: Diagnosis not present

## 2016-04-09 ENCOUNTER — Ambulatory Visit: Payer: Medicare Other

## 2016-04-11 ENCOUNTER — Ambulatory Visit: Payer: Medicare Other

## 2016-04-13 IMAGING — CR DG CHEST 2V
2 series · 2 of 2 positions shown · non-contrast
Comparison: PA and lateral chest dated November 28, 2013

CLINICAL DATA: History of sarcoidosis, diabetes, and hypertension;
no acute cardiopulmonary abnormality

EXAM:
CHEST  2 VIEW

[view not recorded (1 of 2)]
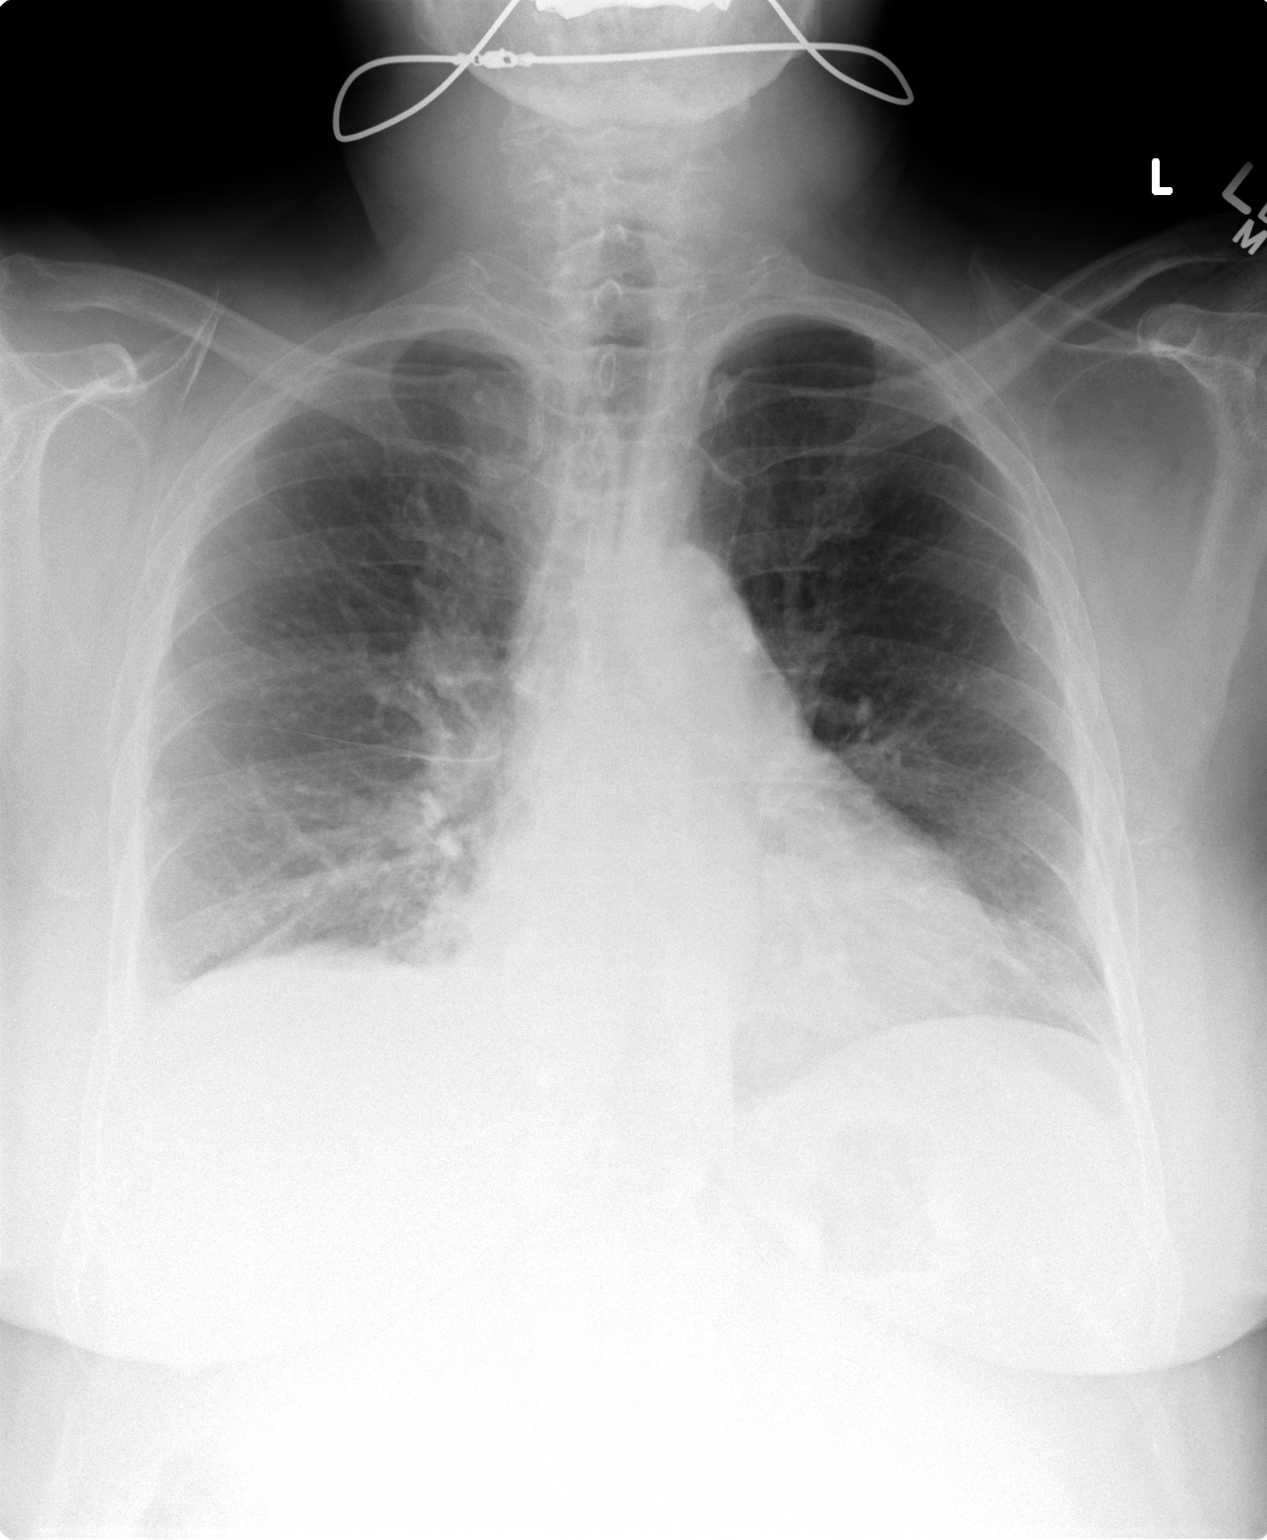

[view not recorded (2 of 2)]
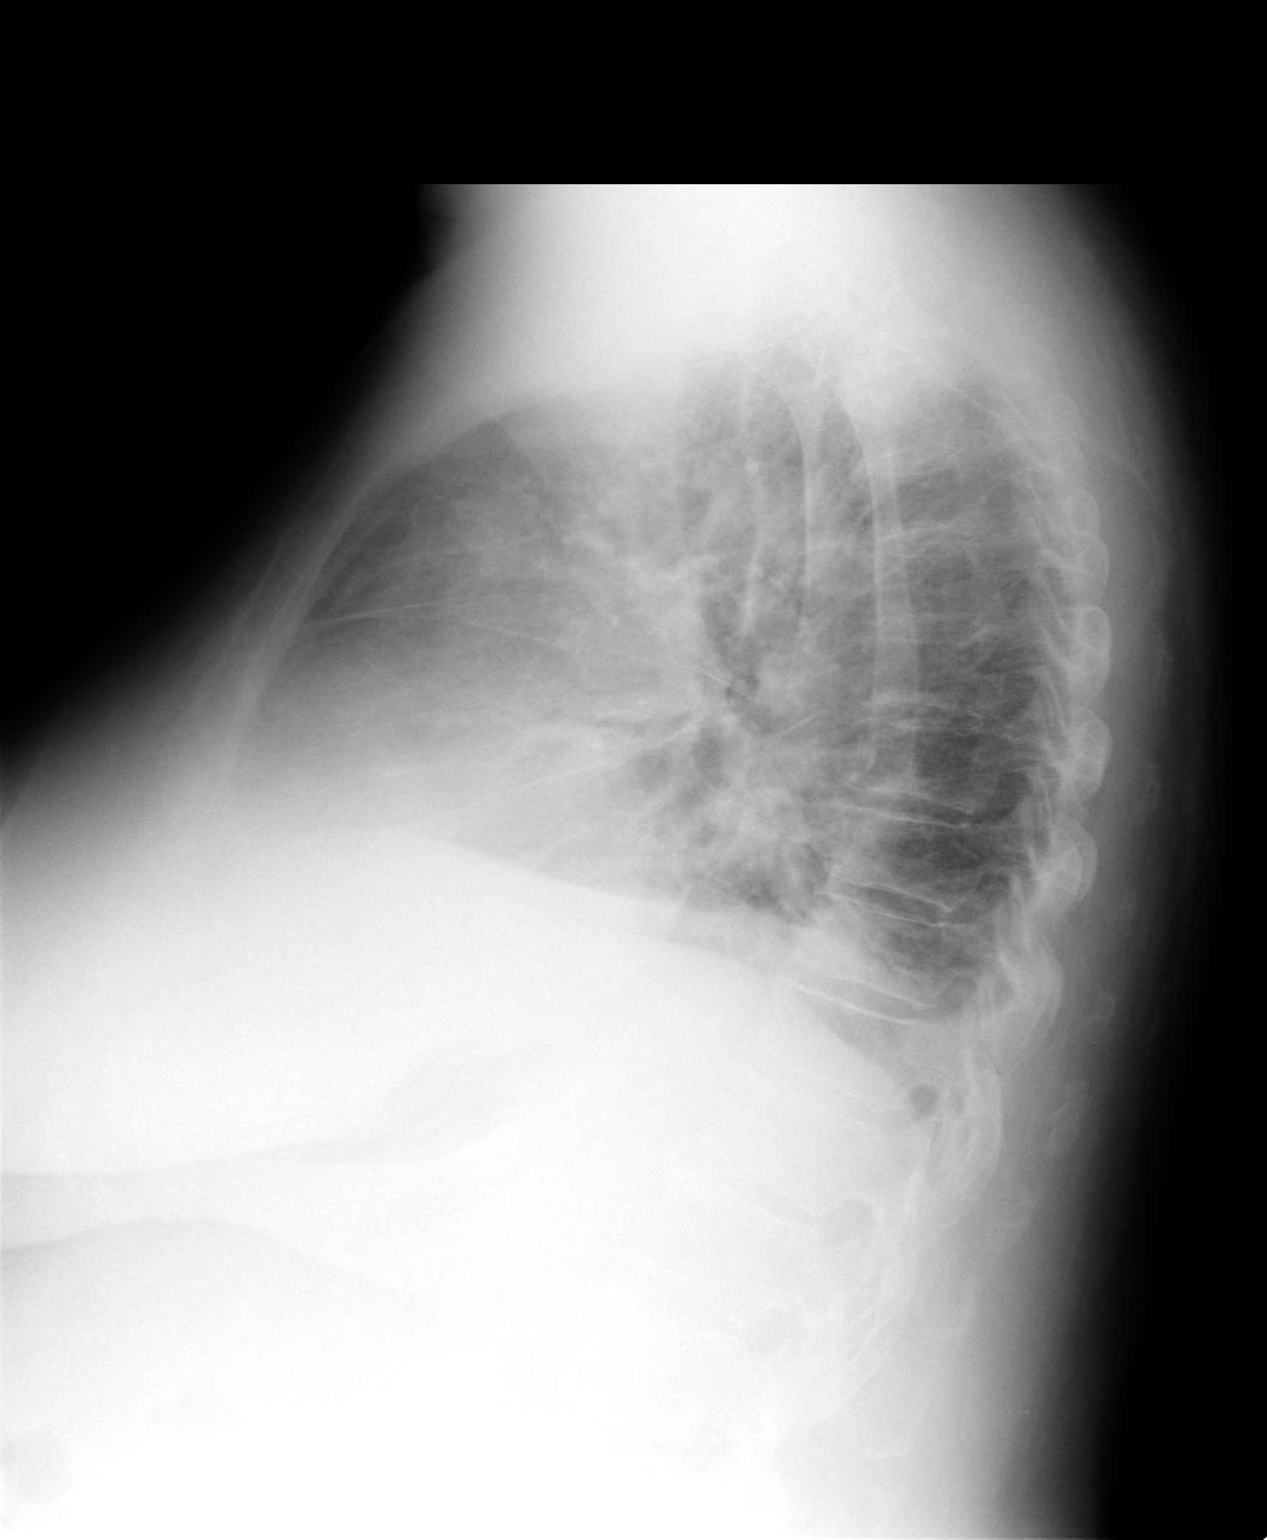

[2 of 2 positions shown; findings below may reference images not displayed]

FINDINGS: The lungs are adequately inflated. The interstitial markings are
coarse but stable. There is stable blunting of the right lateral
posterior costophrenic angles. The cardiac silhouette is top-normal
in size. There is prominence of the hilar structures on the right
today that is not new but slightly more conspicuous. There is old
deformity of the fourth and fifth left ribs.
IMPRESSION: There has not been dramatic interval change in the appearance of the
chest since the earlier study. A small right pleural effusion
persists and right hilar prominence is slightly more conspicuous.

## 2016-04-14 ENCOUNTER — Ambulatory Visit: Payer: Medicare Other

## 2016-04-15 ENCOUNTER — Other Ambulatory Visit: Payer: Self-pay | Admitting: Internal Medicine

## 2016-04-16 ENCOUNTER — Ambulatory Visit: Payer: Medicare Other

## 2016-04-17 ENCOUNTER — Ambulatory Visit: Payer: Medicare Other | Admitting: Family

## 2016-04-17 ENCOUNTER — Encounter (HOSPITAL_COMMUNITY): Payer: Medicare Other

## 2016-04-17 DIAGNOSIS — L97512 Non-pressure chronic ulcer of other part of right foot with fat layer exposed: Secondary | ICD-10-CM | POA: Diagnosis not present

## 2016-04-17 DIAGNOSIS — E1042 Type 1 diabetes mellitus with diabetic polyneuropathy: Secondary | ICD-10-CM | POA: Diagnosis not present

## 2016-04-18 ENCOUNTER — Telehealth: Payer: Self-pay | Admitting: Internal Medicine

## 2016-04-18 ENCOUNTER — Ambulatory Visit: Payer: Medicare Other

## 2016-04-18 MED ORDER — GLUCOSE BLOOD VI STRP: ORAL_STRIP | 11 refills | Status: DC

## 2016-04-18 NOTE — Telephone Encounter (Signed)
t called requesting a refill on her ACCU-CHEK AVIVA PLUS test strip. She does not have any left. Thank you!  [facility] [place] [place] - [address]  Call pt @ [phone]

## 2016-04-21 ENCOUNTER — Ambulatory Visit: Payer: Medicare Other

## 2016-04-23 ENCOUNTER — Ambulatory Visit: Payer: Medicare Other

## 2016-04-25 ENCOUNTER — Ambulatory Visit: Payer: Medicare Other

## 2016-04-28 ENCOUNTER — Encounter: Payer: Self-pay | Admitting: Respiratory Therapy

## 2016-04-28 ENCOUNTER — Ambulatory Visit: Payer: Medicare Other

## 2016-04-28 ENCOUNTER — Other Ambulatory Visit: Payer: Self-pay | Admitting: Internal Medicine

## 2016-04-28 NOTE — Progress Notes (Signed)
Pulmonary Individual Treatment Plan  Patient Details  [name] MRN: [id number] Date of Birth: [birth year] Referring Provider:   Flowsheet Row Pulmonary Rehab from 02/12/2016 in [facility] Cardiac and Pulmonary Rehab  Referring Provider  Derrel Nip      Initial Encounter Date:  Flowsheet Row Pulmonary Rehab from 02/12/2016 in [facility] Cardiac and Pulmonary Rehab  Date  02/12/16  Referring Provider  Derrel Nip      Visit Diagnosis: No diagnosis found.  Patient's Home Medications on Admission:  Current Outpatient Prescriptions:    acetaminophen (TYLENOL) 500 MG tablet, Take 500 mg by mouth every 6 (six) hours as needed for mild pain or headache., Disp: , Rfl:    albuterol (PROAIR HFA) 108 (90 Base) MCG/ACT inhaler, Inhale 2 puffs into the lungs every 6 (six) hours as needed for wheezing or shortness of breath., Disp: 1 Inhaler, Rfl: 2   albuterol (PROVENTIL) (2.5 MG/3ML) 0.083% nebulizer solution, Take 3 mLs (2.5 mg total) by nebulization every 6 (six) hours as needed for wheezing or shortness of breath., Disp: 150 mL, Rfl: 1   alendronate (FOSAMAX) 70 MG tablet, Take 70 mg by mouth once a week. Pt takes on Sunday.   Take with a full glass of water on an empty stomach., Disp: , Rfl:    ALPRAZolam (XANAX) 0.5 MG tablet, Take 0.5 mg by mouth daily., Disp: , Rfl:    aspirin EC 81 MG tablet, Take 81 mg by mouth daily., Disp: , Rfl:    B-D UF III MINI PEN NEEDLES 31G X 5 MM MISC, USE THREE TIMES A DAY, Disp: 270 each, Rfl: 3   benzonatate (TESSALON) 100 MG capsule, Take 200 mg by mouth 3 (three) times daily as needed for cough. Reported on 11/09/2015, Disp: , Rfl:    BRILINTA 90 MG TABS tablet, Take 1 tablet (90 mg total) by mouth 2 (two) times daily., Disp: 180 tablet, Rfl: 2   budesonide-formoterol (SYMBICORT) 160-4.5 MCG/ACT inhaler, Inhale 2 puffs into the lungs 2 (two) times daily., Disp: 1 Inhaler, Rfl: 0   docusate sodium (COLACE) 100 MG capsule, Take 100 mg by mouth 2 (two) times  daily as needed for mild constipation. Reported on 11/09/2015, Disp: , Rfl:    fluticasone (FLONASE) 50 MCG/ACT nasal spray, Place 2 sprays into both nostrils daily as needed for rhinitis., Disp: , Rfl:    furosemide (LASIX) 20 MG tablet, Take 1 tablet (20 mg total) by mouth daily., Disp: 90 tablet, Rfl: 1   glucose blood (ACCU-CHEK AVIVA PLUS) test strip, USE TO CHECK GLUCOSE UP TO THREE TIMES DAILY, Disp: 100 each, Rfl: 11   guaiFENesin-codeine (CHERATUSSIN AC) 100-10 MG/5ML syrup, Take 5 mLs by mouth at bedtime as needed for cough., Disp: 120 mL, Rfl: 0   hydrOXYzine (ATARAX/VISTARIL) 10 MG tablet, Take 1 tablet (10 mg total) by mouth 3 (three) times daily as needed., Disp: 30 tablet, Rfl: 0   Insulin Lispro Prot & Lispro (HUMALOG MIX 75/25 KWIKPEN) (75-25) 100 UNIT/ML Kwikpen, Inject 45 to 60 units twice daily before morning and evening meals (Patient taking differently: Inject 44-60 Units into the skin. Inject 45 to 60 units twice daily before morning and evening meals), Disp: 112 mL, Rfl: 3   Ipratropium-Albuterol (COMBIVENT RESPIMAT) 20-100 MCG/ACT AERS respimat, Inhale 1 puff into the lungs 3 (three) times daily as needed for wheezing or shortness of breath. Reported on 10/25/2015, Disp: , Rfl:    isosorbide mononitrate (IMDUR) 30 MG 24 hr tablet, TAKE 1 TABLET DAILY, Disp: 90  tablet, Rfl: 1   losartan (COZAAR) 100 MG tablet, TAKE 1 TABLET DAILY, Disp: 90 tablet, Rfl: 1   metoprolol tartrate (LOPRESSOR) 25 MG tablet, Take 0.5 tablets (12.5 mg total) by mouth 2 (two) times daily., Disp: , Rfl:    nitroGLYCERIN (NITROSTAT) 0.4 MG SL tablet, Place 1 tablet (0.4 mg total) under the tongue every 5 (five) minutes as needed for chest pain., Disp: 25 tablet, Rfl: 3   ondansetron (ZOFRAN) 8 MG tablet, Take 1 tablet (8 mg total) by mouth every 8 (eight) hours as needed for nausea or vomiting., Disp: 15 tablet, Rfl: 0   PARoxetine (PAXIL) 20 MG tablet, TAKE ONE TABLET BY MOUTH ONCE DAILY, Disp:  30 tablet, Rfl: 1   polyethylene glycol (MIRALAX / GLYCOLAX) packet, Take 17 g by mouth daily as needed for mild constipation. Reported on 10/25/2015, Disp: , Rfl:    pregabalin (LYRICA) 100 MG capsule, Take 1 capsule (100 mg total) by mouth 3 (three) times daily., Disp: 270 capsule, Rfl: 2   rosuvastatin (CRESTOR) 20 MG tablet, Take 20 mg by mouth at bedtime., Disp: , Rfl:    traMADol (ULTRAM) 50 MG tablet, Take 50 mg by mouth every 6 (six) hours as needed for moderate pain., Disp: , Rfl:    Vitamin D, Ergocalciferol, (DRISDOL) 50000 units CAPS capsule, Take 50,000 Units by mouth every 7 (seven) days. Pt takes on Sunday., Disp: , Rfl:  No current facility-administered medications for this visit.   Facility-Administered Medications Ordered in Other Visits:    albuterol (PROVENTIL) (5 MG/ML) 0.5% nebulizer solution 2.5 mg, 2.5 mg, Nebulization, Once, [name], MD  Past Medical History: Past Medical History:  Diagnosis Date   Acute respiratory failure ([place]) 11/21/2014   Arthritis 08/05/2013   Atherosclerotic peripheral vascular disease with gangrene [facility]) august 2012   Cardiomyopathy, ischemic    a. EF 35 to 40% by echo in 2013 b. EF improved to 50-55% by echo in 04/2015.   Cerebral infarct ([place]) 08/17/2013   Chronic diastolic CHF (congestive heart failure) (HCC)    a. EF 50-55% by echo in 04/2015   COPD (chronic obstructive pulmonary disease) (HCC)    Coronary artery disease, occlusive    a. Previous PCI to the LAD, LCx, and RCA in 2010, 2011, 2013, and 2016. All performed in [place].   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, 100% lymphs 10/2013 thoracentesis> cytology negative, WBC 1471, > 90% "small lymphs" 01/2014 CT chest> near complete resolution of pleural effusion, stable lymphadenopathy 08/2014 CT chest [place] ([facility]): small right sided effusion decreased in size, stable mediastinal lymphadenopathy 1.0cm largest, 38m   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   Diagnosed over 20 years ago in [place] with a mediastinal biopsy 03/2013 Full PFT [facility] > UNACCEPTABLE AND NOT REPRODUCIBLE DATA> Ratio 71% FEV 1 1.02 L (55% pred), FVC 1.31 L (49% pred) could not do lung volumes or DLCO    Renal insufficiency    Sarcoidosis ([place])  Tobacco Use: History  Smoking Status   Never Smoker  Smokeless Tobacco   Never Used    Labs: Recent Review Flowsheet Data    Labs for ITP Cardiac and Pulmonary Rehab Latest Ref Rng & Units 09/26/2015 11/06/2015 12/13/2015 01/02/2016 03/14/2016   Cholestrol 0 - 200 mg/dL - 135 - 123 -   LDLCALC 0 - 99 mg/dL - 74 - 64 -   LDLDIRECT mg/dL 73.0 - - - 102.0   HDL >40 mg/dL - 31.90(L) - 29(L) -   Trlycerides <150 mg/dL - 143.0 - 151(H) -   Hemoglobin A1c 4.6 - 6.5 % 9.6(H) - 7.7 - 7.6(H)   HCO3 21.0 - 28.0 mEq/L - - - - -       ADL UCSD:     Pulmonary Assessment Scores    Row Name 02/12/16 1239         ADL UCSD   ADL Phase Entry     SOB Score total 44     Rest 0     Walk 3     Stairs 4     Bath 0     Dress 0     Shop 4        Pulmonary Function Assessment:   Exercise Target Goals:    Exercise Program Goal: Individual exercise prescription set with THRR, safety & activity barriers. Participant demonstrates ability to understand and report RPE using BORG scale, to self-measure pulse accurately, and to acknowledge the importance of the exercise prescription.  Exercise Prescription Goal: Starting with aerobic  activity 30 plus minutes a day, 3 days per week for initial exercise prescription. Provide home exercise prescription and guidelines that participant acknowledges understanding prior to discharge.  Activity Barriers & Risk Stratification:     Activity Barriers & Cardiac Risk Stratification - 02/12/16 1237      Activity Barriers & Cardiac Risk Stratification   Activity Barriers Shortness of Breath;Deconditioning   Cardiac Risk Stratification Moderate      6 Minute Walk:     6 Minute Walk    Row Name 02/12/16 1152         6 Minute Walk   Distance 885 feet     Walk Time 5.49 minutes     # of Rest Breaks 2     MPH 1.83     METS 2.38     RPE 13     Perceived Dyspnea  5     VO2 Peak 5.66     Symptoms Yes (comment)     Comments Shortness of breath - stopped to rest then continued     Resting HR 60 bpm     Resting BP 136/62     Max Ex. HR 77 bpm     Max Ex. BP 148/58       Interval HR   Baseline HR 60     1 Minute HR 64     2 Minute HR 66     3 Minute HR 67     4 Minute HR 75     5 Minute HR 76     6 Minute HR 77     2 Minute Post HR 73     Interval Heart Rate? Yes       Interval Oxygen   Interval Oxygen? Yes     Baseline Oxygen Saturation % 98 %     1 Minute Oxygen Saturation % 95 %     2 Minute Oxygen Saturation % 93 %  3 Minute Oxygen Saturation % 91 %     4 Minute Oxygen Saturation % 95 %     5 Minute Oxygen Saturation % 94 %     6 Minute Oxygen Saturation % 96 %     2 Minute Post Oxygen Saturation % 99 %        Initial Exercise Prescription:     Initial Exercise Prescription - 02/12/16 1200      Date of Initial Exercise RX and Referring Provider   Date 02/12/16   Referring Provider Tullo     Treadmill   MPH 1.5   Grade 0   Minutes 15  5/5/5   METs 2.15     NuStep   Level 2   Minutes 15   METs 2     Biostep-RELP   Level 2   Minutes 15   METs 2     Prescription Details   Frequency (times per week) 3   Duration Progress to 45  minutes of aerobic exercise without signs/symptoms of physical distress     Intensity   THRR 40-80% of Max Heartrate 94-128   Ratings of Perceived Exertion 11-13   Perceived Dyspnea 0-4     Progression   Progression Continue to progress workloads to maintain intensity without signs/symptoms of physical distress.     Resistance Training   Training Prescription Yes   Weight 2   Reps 10-12      Perform Capillary Blood Glucose checks as needed.  Exercise Prescription Changes:   Exercise Comments:   Discharge Exercise Prescription (Final Exercise Prescription Changes):    Nutrition:  Target Goals: Understanding of nutrition guidelines, daily intake of sodium 1500mg , cholesterol 200mg , calories 30% from fat and 7% or less from saturated fats, daily to have 5 or more servings of fruits and vegetables.  Biometrics:     Pre Biometrics - 02/12/16 1152      Pre Biometrics   Height 5\' 7"  (1.702 m)   Weight 193 lb (87.5 kg)   Waist Circumference 45 inches   Hip Circumference 39 inches   Waist to Hip Ratio 1.15 %   BMI (Calculated) 30.3       Nutrition Therapy Plan and Nutrition Goals:   Nutrition Discharge: Rate Your Plate Scores:   Psychosocial: Target Goals: Acknowledge presence or absence of depression, maximize coping skills, provide positive support system. Participant is able to verbalize types and ability to use techniques and skills needed for reducing stress and depression.  Initial Review & Psychosocial Screening:     Initial Psych Review & Screening - 02/12/16 1248      Family Dynamics   Comments Ms Frickey has very little support. Her children live in [place] and her husband has medical problems himself. She enjoys socialization, being with her friens, and will enjpy being in the exercise group.     Barriers   Psychosocial barriers to participate in program The patient should benefit from training in stress management and relaxation.      Screening Interventions   Interventions Encouraged to exercise;Program counselor consult      Quality of Life Scores:     Quality of Life - 02/12/16 1250      Quality of Life Scores   Health/Function Pre 21 %   Socioeconomic Pre 21 %   Psych/Spiritual Pre 29.57 %   Family Pre 21 %   GLOBAL Pre 22.94 %      PHQ-9: Recent Review Flowsheet Data    Depression  screen [facility] 2/9 02/12/2016 12/03/2015 10/10/2015 10/30/2014 10/30/2014   Decreased Interest 1 0 1 0 0   Down, Depressed, Hopeless 2 0 0 0 0   PHQ - 2 Score 3 0 1 0 0   Altered sleeping 0 - - - -   Tired, decreased energy 2 - - - -   Change in appetite 1 - - - -   Feeling bad or failure about yourself  0 - - - -   Trouble concentrating 0 - - - -   Moving slowly or fidgety/restless 1 - - - -   Suicidal thoughts 0 - - - -   PHQ-9 Score 7 - - - -   Difficult doing work/chores Somewhat difficult - - - -      Psychosocial Evaluation and Intervention:   Psychosocial Re-Evaluation:  Education: Education Goals: Education classes will be provided on a weekly basis, covering required topics. Participant will state understanding/return demonstration of topics presented.  Learning Barriers/Preferences:     Learning Barriers/Preferences - 02/12/16 1238      Learning Barriers/Preferences   Learning Barriers Reading   Learning Preferences None      Education Topics: Initial Evaluation Education: - Verbal, written and demonstration of respiratory meds, RPE/PD scales, oximetry and breathing techniques. Instruction on use of nebulizers and MDIs: cleaning and proper use, rinsing mouth with steroid doses and importance of monitoring MDI activations. Flowsheet Row Pulmonary Rehab from 02/12/2016 in [facility] Cardiac and Pulmonary Rehab  Date  02/12/16  Educator  LB  Instruction Review Code  2- meets goals/outcomes      General Nutrition Guidelines/Fats and Fiber: -Group instruction provided by verbal, written material, models and  posters to present the general guidelines for heart healthy nutrition. Gives an explanation and review of dietary fats and fiber.   Controlling Sodium/Reading Food Labels: -Group verbal and written material supporting the discussion of sodium use in heart healthy nutrition. Review and explanation with models, verbal and written materials for utilization of the food label.   Exercise Physiology & Risk Factors: - Group verbal and written instruction with models to review the exercise physiology of the cardiovascular system and associated critical values. Details cardiovascular disease risk factors and the goals associated with each risk factor.   Aerobic Exercise & Resistance Training: - Gives group verbal and written discussion on the health impact of inactivity. On the components of aerobic and resistive training programs and the benefits of this training and how to safely progress through these programs.   Flexibility, Balance, General Exercise Guidelines: - Provides group verbal and written instruction on the benefits of flexibility and balance training programs. Provides general exercise guidelines with specific guidelines to those with heart or lung disease. Demonstration and skill practice provided.   Stress Management: - Provides group verbal and written instruction about the health risks of elevated stress, cause of high stress, and healthy ways to reduce stress.   Depression: - Provides group verbal and written instruction on the correlation between heart/lung disease and depressed mood, treatment options, and the stigmas associated with seeking treatment.   Exercise & Equipment Safety: - Individual verbal instruction and demonstration of equipment use and safety with use of the equipment.   Infection Prevention: - Provides verbal and written material to individual with discussion of infection control including proper hand washing and proper equipment cleaning during exercise  session. Flowsheet Row Pulmonary Rehab from 02/12/2016 in [facility] Cardiac and Pulmonary Rehab  Date  02/12/16  Educator  LB  Instruction Review Code  2- meets goals/outcomes      Falls Prevention: - Provides verbal and written material to individual with discussion of falls prevention and safety. Flowsheet Row Pulmonary Rehab from 02/12/2016 in [facility] Cardiac and Pulmonary Rehab  Date  02/12/16  Educator  LB  Instruction Review Code  2- meets goals/outcomes      Diabetes: - Individual verbal and written instruction to review signs/symptoms of diabetes, desired ranges of glucose level fasting, after meals and with exercise. Advice that pre and post exercise glucose checks will be done for 3 sessions at entry of program.   Chronic Lung Diseases: - Group verbal and written instruction to review new updates, new respiratory medications, new advancements in procedures and treatments. Provide informative websites and "800" numbers of self-education.   Lung Procedures: - Group verbal and written instruction to describe testing methods done to diagnose lung disease. Review the outcome of test results. Describe the treatment choices: Pulmonary Function Tests, ABGs and oximetry.   Energy Conservation: - Provide group verbal and written instruction for methods to conserve energy, plan and organize activities. Instruct on pacing techniques, use of adaptive equipment and posture/positioning to relieve shortness of breath.   Triggers: - Group verbal and written instruction to review types of environmental controls: home humidity, furnaces, filters, dust mite/pet prevention, HEPA vacuums. To discuss weather changes, air quality and the benefits of nasal washing.   Exacerbations: - Group verbal and written instruction to provide: warning signs, infection symptoms, calling MD promptly, preventive modes, and value of vaccinations. Review: effective airway clearance, coughing and/or vibration techniques.  Create an Sports administrator.   Oxygen: - Individual and group verbal and written instruction on oxygen therapy. Includes supplement oxygen, available portable oxygen systems, continuous and intermittent flow rates, oxygen safety, concentrators, and Medicare reimbursement for oxygen.   Respiratory Medications: - Group verbal and written instruction to review medications for lung disease. Drug class, frequency, complications, importance of spacers, rinsing mouth after steroid MDI's, and proper cleaning methods for nebulizers. Flowsheet Row Pulmonary Rehab from 02/12/2016 in [facility] Cardiac and Pulmonary Rehab  Date  02/12/16  Educator  LB  Instruction Review Code  2- meets goals/outcomes      AED/CPR: - Group verbal and written instruction with the use of models to demonstrate the basic use of the AED with the basic ABC's of resuscitation.   Breathing Retraining: - Provides individuals verbal and written instruction on purpose, frequency, and proper technique of diaphragmatic breathing and pursed-lipped breathing. Applies individual practice skills. Flowsheet Row Pulmonary Rehab from 02/12/2016 in [facility] Cardiac and Pulmonary Rehab  Date  02/12/16  Educator  LB  Instruction Review Code  2- meets goals/outcomes      Anatomy and Physiology of the Lungs: - Group verbal and written instruction with the use of models to provide basic lung anatomy and physiology related to function, structure and complications of lung disease.   Heart Failure: - Group verbal and written instruction on the basics of heart failure: signs/symptoms, treatments, explanation of ejection fraction, enlarged heart and cardiomyopathy.   Sleep Apnea: - Individual verbal and written instruction to review Obstructive Sleep Apnea. Review of risk factors, methods for diagnosing and types of masks and machines for OSA.   Anxiety: - Provides group, verbal and written instruction on the correlation between heart/lung disease and  anxiety, treatment options, and management of anxiety.   Relaxation: - Provides group, verbal and written instruction about the benefits of relaxation for patients with heart/lung disease. Also  provides patients with examples of relaxation techniques.   Knowledge Questionnaire Score:     Knowledge Questionnaire Score - 02/12/16 1238      Knowledge Questionnaire Score   Pre Score 10/10       Core Components/Risk Factors/Patient Goals at Admission:     Personal Goals and Risk Factors at Admission - 02/12/16 1244      Core Components/Risk Factors/Patient Goals on Admission   Sedentary Yes   Intervention Provide advice, education, support and counseling about physical activity/exercise needs.;Develop an individualized exercise prescription for aerobic and resistive training based on initial evaluation findings, risk stratification, comorbidities and participant's personal goals.   Expected Outcomes Achievement of increased cardiorespiratory fitness and enhanced flexibility, muscular endurance and strength shown through measurements of functional capacity and personal statement of participant.   Increase Strength and Stamina Yes   Intervention Provide advice, education, support and counseling about physical activity/exercise needs.;Develop an individualized exercise prescription for aerobic and resistive training based on initial evaluation findings, risk stratification, comorbidities and participant's personal goals.   Expected Outcomes Achievement of increased cardiorespiratory fitness and enhanced flexibility, muscular endurance and strength shown through measurements of functional capacity and personal statement of participant.   Improve shortness of breath with ADL's Yes   Intervention Provide education, individualized exercise plan and daily activity instruction to help decrease symptoms of SOB with activities of daily living.   Expected Outcomes Short Term: Achieves a reduction of  symptoms when performing activities of daily living.   Develop more efficient breathing techniques such as purse lipped breathing and diaphragmatic breathing; and practicing self-pacing with activity Yes   Intervention Provide education, demonstration and support about specific breathing techniuqes utilized for more efficient breathing. Include techniques such as pursed lipped breathing, diaphragmatic breathing and self-pacing activity.   Expected Outcomes Short Term: Participant will be able to demonstrate and use breathing techniques as needed throughout daily activities.   Increase knowledge of respiratory medications and ability to use respiratory devices properly  Yes   Intervention Provide education and demonstration as needed of appropriate use of medications, inhalers, and oxygen therapy.  Symbicort and Albuterol MDI; spacer given; SVN Albuterol   Expected Outcomes Short Term: Achieves understanding of medications use. Understands that oxygen is a medication prescribed by physician. Demonstrates appropriate use of inhaler and oxygen therapy.   Diabetes Yes   Intervention Provide education about signs/symptoms and action to take for hypo/hyperglycemia.;Provide education about proper nutrition, including hydration, and aerobic/resistive exercise prescription along with prescribed medications to achieve blood glucose in normal ranges: Fasting glucose 65-99 mg/dL   Expected Outcomes Short Term: Participant verbalizes understanding of the signs/symptoms and immediate care of hyper/hypoglycemia, proper foot care and importance of medication, aerobic/resistive exercise and nutrition plan for blood glucose control.;Long Term: Attainment of HbA1C < 7%.   Heart Failure Yes   Intervention Provide a combined exercise and nutrition program that is supplemented with education, support and counseling about heart failure. Directed toward relieving symptoms such as shortness of breath, decreased exercise  tolerance, and extremity edema.   Expected Outcomes Improve functional capacity of life;Short term: Attendance in program 2-3 days a week with increased exercise capacity. Reported lower sodium intake. Reported increased fruit and vegetable intake. Reports medication compliance.;Short term: Daily weights obtained and reported for increase. Utilizing diuretic protocols set by physician.;Long term: Adoption of self-care skills and reduction of barriers for early signs and symptoms recognition and intervention leading to self-care maintenance.   Hypertension Yes   Intervention Provide education on lifestyle  modifcations including regular physical activity/exercise, weight management, moderate sodium restriction and increased consumption of fresh fruit, vegetables, and low fat dairy, alcohol moderation, and smoking cessation.;Monitor prescription use compliance.   Expected Outcomes Short Term: Continued assessment and intervention until BP is < 140/70mm HG in hypertensive participants. < 130/40mm HG in hypertensive participants with diabetes, heart failure or chronic kidney disease.;Long Term: Maintenance of blood pressure at goal levels.   Lipids Yes   Intervention Provide education and support for participant on nutrition & aerobic/resistive exercise along with prescribed medications to achieve LDL 70mg , HDL >40mg .   Expected Outcomes Short Term: Participant states understanding of desired cholesterol values and is compliant with medications prescribed. Participant is following exercise prescription and nutrition guidelines.;Long Term: Cholesterol controlled with medications as prescribed, with individualized exercise RX and with personalized nutrition plan. Value goals: LDL < 70mg , HDL > 40 mg.      Core Components/Risk Factors/Patient Goals Review:    Core Components/Risk Factors/Patient Goals at Discharge (Final Review):    ITP Comments:     ITP Comments    Row Name 02/18/16 0919 02/26/16 0706  03/14/16 0937 04/01/16 1515     ITP Comments Ms Zurcher called and will miss [facility] today. Her big toe is swollen, and she has an appointment with Dr Derrel Nip today. Ms Beebe called on 02/21/16 and will return to [place] after her appointment with Dr Vickki Muff  in 2 weeks. Ms Kocurek called to inform us that she will not be started [facility] until after her appointment with Dr Vickki Muff. Her right toe ulcer may have to be xrayed. Called Ms Lauf. She had an xray of her right toe and had no infection. Her follow-up appointment with the physician is on 04/08/16,  and she will check on clearance for [facility].       Comments: 30 day note review

## 2016-04-29 ENCOUNTER — Telehealth: Payer: Self-pay | Admitting: Internal Medicine

## 2016-04-29 ENCOUNTER — Telehealth: Payer: Self-pay | Admitting: *Deleted

## 2016-04-29 MED ORDER — PREGABALIN 100 MG PO CAPS: 100.0000 mg | ORAL_CAPSULE | Freq: Three times a day (TID) | ORAL | 2 refills | Status: DC

## 2016-04-29 MED ORDER — GLUCOSE BLOOD VI STRP: ORAL_STRIP | 3 refills | Status: DC

## 2016-04-29 NOTE — Telephone Encounter (Signed)
Refill sent to pharmacy.   

## 2016-04-29 NOTE — Telephone Encounter (Signed)
[facility] called and left vm stating that they need a new prescription for pt's test strips. They states that it may be e-scribed and that it just needs to have directions and diagnosis code. Please advise, thank you!  [place] ([facility] [place] [facility] [place]

## 2016-04-29 NOTE — Telephone Encounter (Signed)
Refill sent sent with DX code as requested.

## 2016-04-29 NOTE — Telephone Encounter (Signed)
Patient has requested medication refill for pregabalin  Pharmacy [facility] [place]

## 2016-04-29 NOTE — Telephone Encounter (Signed)
Okay to refill Lyrica? Last filled on 10/19/15 for #270 and 2 refills.  Last OV: 03/14/16 No future appt scheduled

## 2016-04-30 ENCOUNTER — Ambulatory Visit: Payer: Medicare Other

## 2016-05-01 DIAGNOSIS — L97512 Non-pressure chronic ulcer of other part of right foot with fat layer exposed: Secondary | ICD-10-CM | POA: Diagnosis not present

## 2016-05-01 DIAGNOSIS — Z89422 Acquired absence of other left toe(s): Secondary | ICD-10-CM | POA: Diagnosis not present

## 2016-05-01 DIAGNOSIS — E1042 Type 1 diabetes mellitus with diabetic polyneuropathy: Secondary | ICD-10-CM | POA: Diagnosis not present

## 2016-05-02 ENCOUNTER — Encounter: Payer: Self-pay | Admitting: *Deleted

## 2016-05-02 ENCOUNTER — Ambulatory Visit: Payer: Medicare Other

## 2016-05-02 ENCOUNTER — Telehealth: Payer: Self-pay | Admitting: *Deleted

## 2016-05-02 DIAGNOSIS — G4733 Obstructive sleep apnea (adult) (pediatric): Secondary | ICD-10-CM

## 2016-05-02 NOTE — Progress Notes (Signed)
Pulmonary Individual Treatment Plan  Patient Details  [name] MRN: [id number] Date of Birth: 28-Jul-1940 Referring Provider:   Flowsheet Row Pulmonary Rehab from 02/12/2016 in [facility] Cardiac and Pulmonary Rehab  Referring Provider  Tullo      Initial Encounter Date:  Flowsheet Row Pulmonary Rehab from 02/12/2016 in [facility] Cardiac and Pulmonary Rehab  Date  02/12/16  Referring Provider  Derrel Nip      Visit Diagnosis: Obstructive sleep apnea syndrome  Patient's Home Medications on Admission:  Current Outpatient Prescriptions:  .  acetaminophen (TYLENOL) 500 MG tablet, Take 500 mg by mouth every 6 (six) hours as needed for mild pain or headache., Disp: , Rfl:  .  albuterol (PROAIR HFA) 108 (90 Base) MCG/ACT inhaler, Inhale 2 puffs into the lungs every 6 (six) hours as needed for wheezing or shortness of breath., Disp: 1 Inhaler, Rfl: 2 .  albuterol (PROVENTIL) (2.5 MG/3ML) 0.083% nebulizer solution, Take 3 mLs (2.5 mg total) by nebulization every 6 (six) hours as needed for wheezing or shortness of breath., Disp: 150 mL, Rfl: 1 .  alendronate (FOSAMAX) 70 MG tablet, Take 70 mg by mouth once a week. Pt takes on Sunday.   Take with a full glass of water on an empty stomach., Disp: , Rfl:  .  ALPRAZolam (XANAX) 0.5 MG tablet, Take 0.5 mg by mouth daily., Disp: , Rfl:  .  aspirin EC 81 MG tablet, Take 81 mg by mouth daily., Disp: , Rfl:  .  B-D UF III MINI PEN NEEDLES 31G X 5 MM MISC, USE THREE TIMES A DAY, Disp: 270 each, Rfl: 3 .  benzonatate (TESSALON) 100 MG capsule, Take 200 mg by mouth 3 (three) times daily as needed for cough. Reported on 11/09/2015, Disp: , Rfl:  .  BRILINTA 90 MG TABS tablet, Take 1 tablet (90 mg total) by mouth 2 (two) times daily., Disp: 180 tablet, Rfl: 2 .  budesonide-formoterol (SYMBICORT) 160-4.5 MCG/ACT inhaler, Inhale 2 puffs into the lungs 2 (two) times daily., Disp: 1 Inhaler, Rfl: 0 .  docusate sodium (COLACE) 100 MG capsule, Take 100 mg by mouth 2  (two) times daily as needed for mild constipation. Reported on 11/09/2015, Disp: , Rfl:  .  fluticasone (FLONASE) 50 MCG/ACT nasal spray, Place 2 sprays into both nostrils daily as needed for rhinitis., Disp: , Rfl:  .  furosemide (LASIX) 20 MG tablet, Take 1 tablet (20 mg total) by mouth daily., Disp: 90 tablet, Rfl: 1 .  glucose blood (ACCU-CHEK AVIVA PLUS) test strip, USE TO CHECK GLUCOSE  THREE TIMES DAILY, Disp: 300 each, Rfl: 3 .  guaiFENesin-codeine (CHERATUSSIN AC) 100-10 MG/5ML syrup, Take 5 mLs by mouth at bedtime as needed for cough., Disp: 120 mL, Rfl: 0 .  hydrOXYzine (ATARAX/VISTARIL) 10 MG tablet, Take 1 tablet (10 mg total) by mouth 3 (three) times daily as needed., Disp: 30 tablet, Rfl: 0 .  Insulin Lispro Prot & Lispro (HUMALOG MIX 75/25 KWIKPEN) (75-25) 100 UNIT/ML Kwikpen, Inject 45 to 60 units twice daily before morning and evening meals (Patient taking differently: Inject 44-60 Units into the skin. Inject 45 to 60 units twice daily before morning and evening meals), Disp: 112 mL, Rfl: 3 .  Ipratropium-Albuterol (COMBIVENT RESPIMAT) 20-100 MCG/ACT AERS respimat, Inhale 1 puff into the lungs 3 (three) times daily as needed for wheezing or shortness of breath. Reported on 10/25/2015, Disp: , Rfl:  .  isosorbide mononitrate (IMDUR) 30 MG 24 hr tablet, TAKE 1 TABLET DAILY, Disp: 90  tablet, Rfl: 1 .  losartan (COZAAR) 100 MG tablet, TAKE 1 TABLET DAILY, Disp: 90 tablet, Rfl: 1 .  metoprolol tartrate (LOPRESSOR) 25 MG tablet, Take 0.5 tablets (12.5 mg total) by mouth 2 (two) times daily., Disp: , Rfl:  .  nitroGLYCERIN (NITROSTAT) 0.4 MG SL tablet, Place 1 tablet (0.4 mg total) under the tongue every 5 (five) minutes as needed for chest pain., Disp: 25 tablet, Rfl: 3 .  ondansetron (ZOFRAN) 8 MG tablet, Take 1 tablet (8 mg total) by mouth every 8 (eight) hours as needed for nausea or vomiting., Disp: 15 tablet, Rfl: 0 .  PARoxetine (PAXIL) 20 MG tablet, TAKE ONE TABLET BY MOUTH ONCE DAILY,  Disp: 30 tablet, Rfl: 1 .  polyethylene glycol (MIRALAX / GLYCOLAX) packet, Take 17 g by mouth daily as needed for mild constipation. Reported on 10/25/2015, Disp: , Rfl:  .  pregabalin (LYRICA) 100 MG capsule, Take 1 capsule (100 mg total) by mouth 3 (three) times daily., Disp: 270 capsule, Rfl: 2 .  rosuvastatin (CRESTOR) 20 MG tablet, Take 20 mg by mouth at bedtime., Disp: , Rfl:  .  traMADol (ULTRAM) 50 MG tablet, Take 50 mg by mouth every 6 (six) hours as needed for moderate pain., Disp: , Rfl:  .  Vitamin D, Ergocalciferol, (DRISDOL) 50000 units CAPS capsule, Take 50,000 Units by mouth every 7 (seven) days. Pt takes on Sunday., Disp: , Rfl:  No current facility-administered medications for this visit.   Facility-Administered Medications Ordered in Other Visits:  .  albuterol (PROVENTIL) (5 MG/ML) 0.5% nebulizer solution 2.5 mg, 2.5 mg, Nebulization, Once, [name], MD  Past Medical History: Past Medical History:  Diagnosis Date  . Acute respiratory failure ([place]) 11/21/2014  . Arthritis 08/05/2013  . Atherosclerotic peripheral vascular disease with gangrene [facility]) august 2012  . Cardiomyopathy, ischemic    a. EF 35 to 40% by echo in 2013 b. EF improved to 50-55% by echo in 04/2015.  [name] Cerebral infarct ([place]) 08/17/2013  . Chronic diastolic CHF (congestive heart failure) (H[facility])    a. EF 50-55% by echo in 04/2015  . COPD (chronic obstructive pulmonary disease) ([place])   . Coronary artery disease, occlusive    a. Previous PCI to the LAD, LCx, and RCA in 2010, 2011, 2013, and 2016. All performed in [place].  . 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 Diabetic diarrhea ([place]) 10/03/2014  . DM type 2, uncontrolled, with renal complications ([place]) 08/16/5679  . Hepatic steatosis    by CT abd pelvis  . Hyperlipidemia LDL goal <100 02/23/2014  . Hypertensive heart disease   . Osteoporosis, post-menopausal   . 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 Pleural effusion 10/25/2012   10/2012 CT chest >> small to moderate R lung effusion>> chylothorax, 100% lymphs 10/2013 thoracentesis> cytology negative, WBC 1471, > 90% "small lymphs" 01/2014 CT chest> near complete resolution of pleural effusion, stable lymphadenopathy 08/2014 CT chest [place] ([facility]): small right sided effusion decreased in size, stable mediastinal lymphadenopathy 1.0cm largest, 75m  . 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 Renal insufficiency   . Sarcoidosis ([place])  Tobacco Use: History  Smoking Status  . Never Smoker  Smokeless Tobacco  . Never Used    Labs: Recent Review Flowsheet Data    Labs for ITP Cardiac and Pulmonary Rehab Latest Ref Rng & Units 09/26/2015 11/06/2015 12/13/2015 01/02/2016 03/14/2016   Cholestrol 0 - 200 mg/dL - 135 - 123 -   LDLCALC 0 - 99 mg/dL - 74 - 64 -   LDLDIRECT mg/dL 73.0 - - - 102.0   HDL >40 mg/dL - 31.90(L) - 29(L) -   Trlycerides <150 mg/dL - 143.0 - 151(H) -   Hemoglobin A1c 4.6 - 6.5 % 9.6(H) - 7.7 - 7.6(H)   HCO3 21.0 - 28.0 mEq/L - - - - -       ADL UCSD:     Pulmonary Assessment Scores    Row Name 02/12/16 1239         ADL UCSD   ADL Phase Entry     SOB Score total 44     Rest 0     Walk 3     Stairs 4     Bath 0     Dress 0     Shop 4        Pulmonary Function Assessment:   Exercise Target Goals:    Exercise Program Goal: Individual exercise prescription set with THRR, safety & activity barriers. Participant demonstrates ability to understand and report RPE using BORG scale, to self-measure pulse accurately, and to acknowledge the importance of the exercise prescription.  Exercise Prescription Goal: Starting with  aerobic activity 30 plus minutes a day, 3 days per week for initial exercise prescription. Provide home exercise prescription and guidelines that participant acknowledges understanding prior to discharge.  Activity Barriers & Risk Stratification:     Activity Barriers & Cardiac Risk Stratification - 02/12/16 1237      Activity Barriers & Cardiac Risk Stratification   Activity Barriers Shortness of Breath;Deconditioning   Cardiac Risk Stratification Moderate      6 Minute Walk:     6 Minute Walk    Row Name 02/12/16 1152         6 Minute Walk   Distance 885 feet     Walk Time 5.49 minutes     # of Rest Breaks 2     MPH 1.83     METS 2.38     RPE 13     Perceived Dyspnea  5     VO2 Peak 5.66     Symptoms Yes (comment)     Comments Shortness of breath - stopped to rest then continued     Resting HR 60 bpm     Resting BP 136/62     Max Ex. HR 77 bpm     Max Ex. BP 148/58       Interval HR   Baseline HR 60     1 Minute HR 64     2 Minute HR 66     3 Minute HR 67     4 Minute HR 75     5 Minute HR 76     6 Minute HR 77     2 Minute Post HR 73     Interval Heart Rate? Yes       Interval Oxygen   Interval Oxygen? Yes     Baseline Oxygen Saturation % 98 %     1 Minute Oxygen Saturation % 95 %     2 Minute Oxygen Saturation % 93 %  3 Minute Oxygen Saturation % 91 %     4 Minute Oxygen Saturation % 95 %     5 Minute Oxygen Saturation % 94 %     6 Minute Oxygen Saturation % 96 %     2 Minute Post Oxygen Saturation % 99 %        Initial Exercise Prescription:     Initial Exercise Prescription - 02/12/16 1200      Date of Initial Exercise RX and Referring Provider   Date 02/12/16   Referring Provider Tullo     Treadmill   MPH 1.5   Grade 0   Minutes 15  5/5/5   METs 2.15     NuStep   Level 2   Minutes 15   METs 2     Biostep-RELP   Level 2   Minutes 15   METs 2     Prescription Details   Frequency (times per week) 3   Duration Progress to  45 minutes of aerobic exercise without signs/symptoms of physical distress     Intensity   THRR 40-80% of Max Heartrate 94-128   Ratings of Perceived Exertion 11-13   Perceived Dyspnea 0-4     Progression   Progression Continue to progress workloads to maintain intensity without signs/symptoms of physical distress.     Resistance Training   Training Prescription Yes   Weight 2   Reps 10-12      Perform Capillary Blood Glucose checks as needed.  Exercise Prescription Changes:   Exercise Comments:   Discharge Exercise Prescription (Final Exercise Prescription Changes):    Nutrition:  Target Goals: Understanding of nutrition guidelines, daily intake of sodium 1500mg , cholesterol 200mg , calories 30% from fat and 7% or less from saturated fats, daily to have 5 or more servings of fruits and vegetables.  Biometrics:     Pre Biometrics - 02/12/16 1152      Pre Biometrics   Height 5\' 7"  (1.702 m)   Weight 193 lb (87.5 kg)   Waist Circumference 45 inches   Hip Circumference 39 inches   Waist to Hip Ratio 1.15 %   BMI (Calculated) 30.3       Nutrition Therapy Plan and Nutrition Goals:   Nutrition Discharge: Rate Your Plate Scores:   Psychosocial: Target Goals: Acknowledge presence or absence of depression, maximize coping skills, provide positive support system. Participant is able to verbalize types and ability to use techniques and skills needed for reducing stress and depression.  Initial Review & Psychosocial Screening:     Initial Psych Review & Screening - 02/12/16 1248      Family Dynamics   Comments Pamela Collier has very little support. Her children live in [place] and her husband has medical problems himself. She enjoys socialization, being with her friens, and will enjpy being in the exercise group.     Barriers   Psychosocial barriers to participate in program The patient should benefit from training in stress management and relaxation.      Screening Interventions   Interventions Encouraged to exercise;Program counselor consult      Quality of Life Scores:     Quality of Life - 02/12/16 1250      Quality of Life Scores   Health/Function Pre 21 %   Socioeconomic Pre 21 %   Psych/Spiritual Pre 29.57 %   Family Pre 21 %   GLOBAL Pre 22.94 %      PHQ-9: Recent Review Flowsheet Data    Depression  screen [facility] 2/9 02/12/2016 12/03/2015 10/10/2015 10/30/2014 10/30/2014   Decreased Interest 1 0 1 0 0   Down, Depressed, Hopeless 2 0 0 0 0   PHQ - 2 Score 3 0 1 0 0   Altered sleeping 0 - - - -   Tired, decreased energy 2 - - - -   Change in appetite 1 - - - -   Feeling bad or failure about yourself  0 - - - -   Trouble concentrating 0 - - - -   Moving slowly or fidgety/restless 1 - - - -   Suicidal thoughts 0 - - - -   PHQ-9 Score 7 - - - -   Difficult doing work/chores Somewhat difficult - - - -      Psychosocial Evaluation and Intervention:   Psychosocial Re-Evaluation:  Education: Education Goals: Education classes will be provided on a weekly basis, covering required topics. Participant will state understanding/return demonstration of topics presented.  Learning Barriers/Preferences:     Learning Barriers/Preferences - 02/12/16 1238      Learning Barriers/Preferences   Learning Barriers Reading   Learning Preferences None      Education Topics: Initial Evaluation Education: - Verbal, written and demonstration of respiratory meds, RPE/PD scales, oximetry and breathing techniques. Instruction on use of nebulizers and MDIs: cleaning and proper use, rinsing mouth with steroid doses and importance of monitoring MDI activations. Flowsheet Row Pulmonary Rehab from 02/12/2016 in [facility] Cardiac and Pulmonary Rehab  Date  02/12/16  Educator  LB  Instruction Review Code  2- meets goals/outcomes      General Nutrition Guidelines/Fats and Fiber: -Group instruction provided by verbal, written material, models and  posters to present the general guidelines for heart healthy nutrition. Gives an explanation and review of dietary fats and fiber.   Controlling Sodium/Reading Food Labels: -Group verbal and written material supporting the discussion of sodium use in heart healthy nutrition. Review and explanation with models, verbal and written materials for utilization of the food label.   Exercise Physiology & Risk Factors: - Group verbal and written instruction with models to review the exercise physiology of the cardiovascular system and associated critical values. Details cardiovascular disease risk factors and the goals associated with each risk factor.   Aerobic Exercise & Resistance Training: - Gives group verbal and written discussion on the health impact of inactivity. On the components of aerobic and resistive training programs and the benefits of this training and how to safely progress through these programs.   Flexibility, Balance, General Exercise Guidelines: - Provides group verbal and written instruction on the benefits of flexibility and balance training programs. Provides general exercise guidelines with specific guidelines to those with heart or lung disease. Demonstration and skill practice provided.   Stress Management: - Provides group verbal and written instruction about the health risks of elevated stress, cause of high stress, and healthy ways to reduce stress.   Depression: - Provides group verbal and written instruction on the correlation between heart/lung disease and depressed mood, treatment options, and the stigmas associated with seeking treatment.   Exercise & Equipment Safety: - Individual verbal instruction and demonstration of equipment use and safety with use of the equipment.   Infection Prevention: - Provides verbal and written material to individual with discussion of infection control including proper hand washing and proper equipment cleaning during exercise  session. Flowsheet Row Pulmonary Rehab from 02/12/2016 in [facility] Cardiac and Pulmonary Rehab  Date  02/12/16  Educator  LB  Instruction Review Code  2- meets goals/outcomes      Falls Prevention: - Provides verbal and written material to individual with discussion of falls prevention and safety. Flowsheet Row Pulmonary Rehab from 02/12/2016 in [facility] Cardiac and Pulmonary Rehab  Date  02/12/16  Educator  LB  Instruction Review Code  2- meets goals/outcomes      Diabetes: - Individual verbal and written instruction to review signs/symptoms of diabetes, desired ranges of glucose level fasting, after meals and with exercise. Advice that pre and post exercise glucose checks will be done for 3 sessions at entry of program.   Chronic Lung Diseases: - Group verbal and written instruction to review new updates, new respiratory medications, new advancements in procedures and treatments. Provide informative websites and "800" numbers of self-education.   Lung Procedures: - Group verbal and written instruction to describe testing methods done to diagnose lung disease. Review the outcome of test results. Describe the treatment choices: Pulmonary Function Tests, ABGs and oximetry.   Energy Conservation: - Provide group verbal and written instruction for methods to conserve energy, plan and organize activities. Instruct on pacing techniques, use of adaptive equipment and posture/positioning to relieve shortness of breath.   Triggers: - Group verbal and written instruction to review types of environmental controls: home humidity, furnaces, filters, dust mite/pet prevention, HEPA vacuums. To discuss weather changes, air quality and the benefits of nasal washing.   Exacerbations: - Group verbal and written instruction to provide: warning signs, infection symptoms, calling MD promptly, preventive modes, and value of vaccinations. Review: effective airway clearance, coughing and/or vibration techniques.  Create an Sports administrator.   Oxygen: - Individual and group verbal and written instruction on oxygen therapy. Includes supplement oxygen, available portable oxygen systems, continuous and intermittent flow rates, oxygen safety, concentrators, and Medicare reimbursement for oxygen.   Respiratory Medications: - Group verbal and written instruction to review medications for lung disease. Drug class, frequency, complications, importance of spacers, rinsing mouth after steroid MDI's, and proper cleaning methods for nebulizers. Flowsheet Row Pulmonary Rehab from 02/12/2016 in [facility] Cardiac and Pulmonary Rehab  Date  02/12/16  Educator  LB  Instruction Review Code  2- meets goals/outcomes      AED/CPR: - Group verbal and written instruction with the use of models to demonstrate the basic use of the AED with the basic ABC's of resuscitation.   Breathing Retraining: - Provides individuals verbal and written instruction on purpose, frequency, and proper technique of diaphragmatic breathing and pursed-lipped breathing. Applies individual practice skills. Flowsheet Row Pulmonary Rehab from 02/12/2016 in [facility] Cardiac and Pulmonary Rehab  Date  02/12/16  Educator  LB  Instruction Review Code  2- meets goals/outcomes      Anatomy and Physiology of the Lungs: - Group verbal and written instruction with the use of models to provide basic lung anatomy and physiology related to function, structure and complications of lung disease.   Heart Failure: - Group verbal and written instruction on the basics of heart failure: signs/symptoms, treatments, explanation of ejection fraction, enlarged heart and cardiomyopathy.   Sleep Apnea: - Individual verbal and written instruction to review Obstructive Sleep Apnea. Review of risk factors, methods for diagnosing and types of masks and machines for OSA.   Anxiety: - Provides group, verbal and written instruction on the correlation between heart/lung disease and  anxiety, treatment options, and management of anxiety.   Relaxation: - Provides group, verbal and written instruction about the benefits of relaxation for patients with heart/lung disease. Also  provides patients with examples of relaxation techniques.   Knowledge Questionnaire Score:     Knowledge Questionnaire Score - 02/12/16 1238      Knowledge Questionnaire Score   Pre Score 10/10       Core Components/Risk Factors/Patient Goals at Admission:     Personal Goals and Risk Factors at Admission - 02/12/16 1244      Core Components/Risk Factors/Patient Goals on Admission   Sedentary Yes   Intervention Provide advice, education, support and counseling about physical activity/exercise needs.;Develop an individualized exercise prescription for aerobic and resistive training based on initial evaluation findings, risk stratification, comorbidities and participant's personal goals.   Expected Outcomes Achievement of increased cardiorespiratory fitness and enhanced flexibility, muscular endurance and strength shown through measurements of functional capacity and personal statement of participant.   Increase Strength and Stamina Yes   Intervention Provide advice, education, support and counseling about physical activity/exercise needs.;Develop an individualized exercise prescription for aerobic and resistive training based on initial evaluation findings, risk stratification, comorbidities and participant's personal goals.   Expected Outcomes Achievement of increased cardiorespiratory fitness and enhanced flexibility, muscular endurance and strength shown through measurements of functional capacity and personal statement of participant.   Improve shortness of breath with ADL's Yes   Intervention Provide education, individualized exercise plan and daily activity instruction to help decrease symptoms of SOB with activities of daily living.   Expected Outcomes Short Term: Achieves a reduction of  symptoms when performing activities of daily living.   Develop more efficient breathing techniques such as purse lipped breathing and diaphragmatic breathing; and practicing self-pacing with activity Yes   Intervention Provide education, demonstration and support about specific breathing techniuqes utilized for more efficient breathing. Include techniques such as pursed lipped breathing, diaphragmatic breathing and self-pacing activity.   Expected Outcomes Short Term: Participant will be able to demonstrate and use breathing techniques as needed throughout daily activities.   Increase knowledge of respiratory medications and ability to use respiratory devices properly  Yes   Intervention Provide education and demonstration as needed of appropriate use of medications, inhalers, and oxygen therapy.  Symbicort and Albuterol MDI; spacer given; SVN Albuterol   Expected Outcomes Short Term: Achieves understanding of medications use. Understands that oxygen is a medication prescribed by physician. Demonstrates appropriate use of inhaler and oxygen therapy.   Diabetes Yes   Intervention Provide education about signs/symptoms and action to take for hypo/hyperglycemia.;Provide education about proper nutrition, including hydration, and aerobic/resistive exercise prescription along with prescribed medications to achieve blood glucose in normal ranges: Fasting glucose 65-99 mg/dL   Expected Outcomes Short Term: Participant verbalizes understanding of the signs/symptoms and immediate care of hyper/hypoglycemia, proper foot care and importance of medication, aerobic/resistive exercise and nutrition plan for blood glucose control.;Long Term: Attainment of HbA1C < 7%.   Heart Failure Yes   Intervention Provide a combined exercise and nutrition program that is supplemented with education, support and counseling about heart failure. Directed toward relieving symptoms such as shortness of breath, decreased exercise  tolerance, and extremity edema.   Expected Outcomes Improve functional capacity of life;Short term: Attendance in program 2-3 days a week with increased exercise capacity. Reported lower sodium intake. Reported increased fruit and vegetable intake. Reports medication compliance.;Short term: Daily weights obtained and reported for increase. Utilizing diuretic protocols set by physician.;Long term: Adoption of self-care skills and reduction of barriers for early signs and symptoms recognition and intervention leading to self-care maintenance.   Hypertension Yes   Intervention Provide education on lifestyle  modifcations including regular physical activity/exercise, weight management, moderate sodium restriction and increased consumption of fresh fruit, vegetables, and low fat dairy, alcohol moderation, and smoking cessation.;Monitor prescription use compliance.   Expected Outcomes Short Term: Continued assessment and intervention until BP is < 140/61mm HG in hypertensive participants. < 130/51mm HG in hypertensive participants with diabetes, heart failure or chronic kidney disease.;Long Term: Maintenance of blood pressure at goal levels.   Lipids Yes   Intervention Provide education and support for participant on nutrition & aerobic/resistive exercise along with prescribed medications to achieve LDL 70mg , HDL >40mg .   Expected Outcomes Short Term: Participant states understanding of desired cholesterol values and is compliant with medications prescribed. Participant is following exercise prescription and nutrition guidelines.;Long Term: Cholesterol controlled with medications as prescribed, with individualized exercise RX and with personalized nutrition plan. Value goals: LDL < 70mg , HDL > 40 mg.      Core Components/Risk Factors/Patient Goals Review:    Core Components/Risk Factors/Patient Goals at Discharge (Final Review):    ITP Comments:     ITP Comments    Row Name 02/18/16 0919 02/26/16 0706  03/14/16 0937 04/01/16 1515 05/02/16 1043   ITP Comments Pamela Collier called and will miss [facility] today. Her big toe is swollen, and she has an appointment with Dr Derrel Nip today. Pamela Collier called on 02/21/16 and will return to [place] after her appointment with Dr Vickki Muff  in 2 weeks. Pamela Collier called to inform us that she will not be started [facility] until after her appointment with Dr Vickki Muff. Her right toe ulcer may have to be xrayed. Called Pamela Collier. She had an xray of her right toe and had no infection. Her follow-up appointment with the physician is on 04/08/16,  and she will check on clearance for [facility]. Called to check on status.  Her foot ulcer is still healing, but is starting to look better.  She is now out of the boot.  I encouraged herto let her foot fully heal before returning to rehab so that she is able to walk. We will discharge her at this time and have her restart once she is cleared by podiatry to participate.  She has been out since October.      Comments: Discharge ITP

## 2016-05-02 NOTE — Telephone Encounter (Signed)
Called to check on status.  Her foot ulcer is still healing, but is starting to look better.  She is now out of the boot.  I encouraged herto let her foot fully heal before returning to rehab so that she is able to walk. We will discharge her at this time and have her restart once she is cleared by podiatry to participate.  She has been out since October.

## 2016-05-02 NOTE — Progress Notes (Signed)
Discharge Summary  Patient Details  [name] MRN: [id number] Date of Birth: July 09, 1940 Referring Provider:   Flowsheet Row Pulmonary Rehab from 02/12/2016 in [facility]  Referring Provider  Tullo       Number of Visits: 1  Reason for Discharge:  Early Exit:  Foot Ulcer Healing  Smoking History:  History  Smoking Status  . Never Smoker  Smokeless Tobacco  . Never Used    Diagnosis:  Obstructive sleep apnea syndrome  ADL UCSD:     Pulmonary Assessment Scores    Row Name 02/12/16 1239         ADL UCSD   ADL Phase Entry     SOB Score total 44     Rest 0     Walk 3     Stairs 4     Bath 0     Dress 0     Shop 4        Initial Exercise Prescription:     Initial Exercise Prescription - 02/12/16 1200      Date of Initial Exercise RX and Referring Provider   Date 02/12/16   Referring Provider Tullo     Treadmill   MPH 1.5   Grade 0   Minutes 15  5/5/5   METs 2.15     NuStep   Level 2   Minutes 15   METs 2     Biostep-RELP   Level 2   Minutes 15   METs 2     Prescription Details   Frequency (times per week) 3   Duration Progress to 45 minutes of aerobic exercise without signs/symptoms of physical distress     Intensity   THRR 40-80% of Max Heartrate 94-128   Ratings of Perceived Exertion 11-13   Perceived Dyspnea 0-4     Progression   Progression Continue to progress workloads to maintain intensity without signs/symptoms of physical distress.     Resistance Training   Training Prescription Yes   Weight 2   Reps 10-12      Discharge Exercise Prescription (Final Exercise Prescription Changes):   Functional Capacity:     6 Minute Walk    Row Name 02/12/16 1152         6 Minute Walk   Distance 885 feet     Walk Time 5.49 minutes     # of Rest Breaks 2     MPH 1.83     METS 2.38     RPE 13     Perceived Dyspnea  5     VO2 Peak 5.66     Symptoms Yes (comment)     Comments Shortness of  breath - stopped to rest then continued     Resting HR 60 bpm     Resting BP 136/62     Max Ex. HR 77 bpm     Max Ex. BP 148/58       Interval HR   Baseline HR 60     1 Minute HR 64     2 Minute HR 66     3 Minute HR 67     4 Minute HR 75     5 Minute HR 76     6 Minute HR 77     2 Minute Post HR 73     Interval Heart Rate? Yes       Interval Oxygen   Interval Oxygen? Yes     Baseline Oxygen  Saturation % 98 %     1 Minute Oxygen Saturation % 95 %     2 Minute Oxygen Saturation % 93 %     3 Minute Oxygen Saturation % 91 %     4 Minute Oxygen Saturation % 95 %     5 Minute Oxygen Saturation % 94 %     6 Minute Oxygen Saturation % 96 %     2 Minute Post Oxygen Saturation % 99 %        Psychological, QOL, Others - Outcomes: PHQ 2/9: Depression screen [facility] 2/9 02/12/2016 12/03/2015 10/10/2015 10/30/2014 10/30/2014  Decreased Interest 1 0 1 0 0  Down, Depressed, Hopeless 2 0 0 0 0  PHQ - 2 Score 3 0 1 0 0  Altered sleeping 0 - - - -  Tired, decreased energy 2 - - - -  Change in appetite 1 - - - -  Feeling bad or failure about yourself  0 - - - -  Trouble concentrating 0 - - - -  Moving slowly or fidgety/restless 1 - - - -  Suicidal thoughts 0 - - - -  PHQ-9 Score 7 - - - -  Difficult doing work/chores Somewhat difficult - - - -  Some recent data might be hidden    Quality of Life:     Quality of Life - 02/12/16 1250      Quality of Life Scores   Health/Function Pre 21 %   Socioeconomic Pre 21 %   Psych/Spiritual Pre 29.57 %   Family Pre 21 %   GLOBAL Pre 22.94 %      Personal Goals: Goals established at orientation with interventions provided to work toward goal.     Personal Goals and Risk Factors at Admission - 02/12/16 1244      Core Components/Risk Factors/Patient Goals on Admission   Sedentary Yes   Intervention Provide advice, education, support and counseling about physical activity/exercise needs.;Develop an individualized exercise prescription for  aerobic and resistive training based on initial evaluation findings, risk stratification, comorbidities and participant's personal goals.   Expected Outcomes Achievement of increased cardiorespiratory fitness and enhanced flexibility, muscular endurance and strength shown through measurements of functional capacity and personal statement of participant.   Increase Strength and Stamina Yes   Intervention Provide advice, education, support and counseling about physical activity/exercise needs.;Develop an individualized exercise prescription for aerobic and resistive training based on initial evaluation findings, risk stratification, comorbidities and participant's personal goals.   Expected Outcomes Achievement of increased cardiorespiratory fitness and enhanced flexibility, muscular endurance and strength shown through measurements of functional capacity and personal statement of participant.   Improve shortness of breath with ADL's Yes   Intervention Provide education, individualized exercise plan and daily activity instruction to help decrease symptoms of SOB with activities of daily living.   Expected Outcomes Short Term: Achieves a reduction of symptoms when performing activities of daily living.   Develop more efficient breathing techniques such as purse lipped breathing and diaphragmatic breathing; and practicing self-pacing with activity Yes   Intervention Provide education, demonstration and support about specific breathing techniuqes utilized for more efficient breathing. Include techniques such as pursed lipped breathing, diaphragmatic breathing and self-pacing activity.   Expected Outcomes Short Term: Participant will be able to demonstrate and use breathing techniques as needed throughout daily activities.   Increase knowledge of respiratory medications and ability to use respiratory devices properly  Yes   Intervention Provide education and demonstration as needed of  appropriate use of  medications, inhalers, and oxygen therapy.  Symbicort and Albuterol MDI; spacer given; SVN Albuterol   Expected Outcomes Short Term: Achieves understanding of medications use. Understands that oxygen is a medication prescribed by physician. Demonstrates appropriate use of inhaler and oxygen therapy.   Diabetes Yes   Intervention Provide education about signs/symptoms and action to take for hypo/hyperglycemia.;Provide education about proper nutrition, including hydration, and aerobic/resistive exercise prescription along with prescribed medications to achieve blood glucose in normal ranges: Fasting glucose 65-99 mg/dL   Expected Outcomes Short Term: Participant verbalizes understanding of the signs/symptoms and immediate care of hyper/hypoglycemia, proper foot care and importance of medication, aerobic/resistive exercise and nutrition plan for blood glucose control.;Long Term: Attainment of HbA1C < 7%.   Heart Failure Yes   Intervention Provide a combined exercise and nutrition program that is supplemented with education, support and counseling about heart failure. Directed toward relieving symptoms such as shortness of breath, decreased exercise tolerance, and extremity edema.   Expected Outcomes Improve functional capacity of life;Short term: Attendance in program 2-3 days a week with increased exercise capacity. Reported lower sodium intake. Reported increased fruit and vegetable intake. Reports medication compliance.;Short term: Daily weights obtained and reported for increase. Utilizing diuretic protocols set by physician.;Long term: Adoption of self-care skills and reduction of barriers for early signs and symptoms recognition and intervention leading to self-care maintenance.   Hypertension Yes   Intervention Provide education on lifestyle modifcations including regular physical activity/exercise, weight management, moderate sodium restriction and increased consumption of fresh fruit, vegetables, and  low fat dairy, alcohol moderation, and smoking cessation.;Monitor prescription use compliance.   Expected Outcomes Short Term: Continued assessment and intervention until BP is < 140/43mm HG in hypertensive participants. < 130/47mm HG in hypertensive participants with diabetes, heart failure or chronic kidney disease.;Long Term: Maintenance of blood pressure at goal levels.   Lipids Yes   Intervention Provide education and support for participant on nutrition & aerobic/resistive exercise along with prescribed medications to achieve LDL 70mg , HDL >40mg .   Expected Outcomes Short Term: Participant states understanding of desired cholesterol values and is compliant with medications prescribed. Participant is following exercise prescription and nutrition guidelines.;Long Term: Cholesterol controlled with medications as prescribed, with individualized exercise RX and with personalized nutrition plan. Value goals: LDL < 70mg , HDL > 40 mg.       Personal Goals Discharge:   Nutrition & Weight - Outcomes:     Pre Biometrics - 02/12/16 1152      Pre Biometrics   Height 5\' 7"  (1.702 m)   Weight 193 lb (87.5 kg)   Waist Circumference 45 inches   Hip Circumference 39 inches   Waist to Hip Ratio 1.15 %   BMI (Calculated) 30.3       Nutrition:   Nutrition Discharge:   Education Questionnaire Score:     Knowledge Questionnaire Score - 02/12/16 1238      Knowledge Questionnaire Score   Pre Score 10/10      Goals reviewed with patient; copy given to patient.

## 2016-05-06 ENCOUNTER — Telehealth: Payer: Self-pay | Admitting: Internal Medicine

## 2016-05-06 MED ORDER — ALBUTEROL SULFATE (2.5 MG/3ML) 0.083% IN NEBU
2.5000 mg | INHALATION_SOLUTION | Freq: Four times a day (QID) | RESPIRATORY_TRACT | 1 refills | Status: DC | PRN
Start: 2016-05-06 — End: 2016-11-10

## 2016-05-06 NOTE — Telephone Encounter (Signed)
Nebulizer script sent as requested.

## 2016-05-06 NOTE — Telephone Encounter (Signed)
Pt states that her wheezing is back and she needs a refill on albuterol (PROVENTIL) (2.5 MG/3ML) 0.083% nebulizer solution dent to  [facility] in [place]. Please advise

## 2016-05-07 ENCOUNTER — Ambulatory Visit: Payer: Medicare Other

## 2016-05-09 ENCOUNTER — Ambulatory Visit: Payer: Medicare Other

## 2016-05-10 ENCOUNTER — Other Ambulatory Visit: Payer: Self-pay | Admitting: Internal Medicine

## 2016-05-14 ENCOUNTER — Ambulatory Visit: Payer: Medicare Other

## 2016-05-16 ENCOUNTER — Ambulatory Visit (INDEPENDENT_AMBULATORY_CARE_PROVIDER_SITE_OTHER): Payer: Medicare Other | Admitting: Internal Medicine

## 2016-05-16 ENCOUNTER — Encounter: Payer: Self-pay | Admitting: Internal Medicine

## 2016-05-16 ENCOUNTER — Ambulatory Visit (INDEPENDENT_AMBULATORY_CARE_PROVIDER_SITE_OTHER): Payer: Medicare Other

## 2016-05-16 ENCOUNTER — Telehealth: Payer: Self-pay | Admitting: Internal Medicine

## 2016-05-16 VITALS — BP 148/56 | HR 61 | Temp 98.7°F | Resp 16 | Ht 67.0 in | Wt 188.1 lb

## 2016-05-16 DIAGNOSIS — R05 Cough: Secondary | ICD-10-CM

## 2016-05-16 DIAGNOSIS — J209 Acute bronchitis, unspecified: Secondary | ICD-10-CM | POA: Diagnosis not present

## 2016-05-16 DIAGNOSIS — J9801 Acute bronchospasm: Secondary | ICD-10-CM

## 2016-05-16 DIAGNOSIS — R0603 Acute respiratory distress: Secondary | ICD-10-CM

## 2016-05-16 DIAGNOSIS — R059 Cough, unspecified: Secondary | ICD-10-CM

## 2016-05-16 LAB — CBC WITH DIFFERENTIAL/PLATELET
BASOS ABS: 0 10*3/uL (ref 0.0–0.1)
BASOS PCT: 0.5 % (ref 0.0–3.0)
EOS ABS: 0.1 10*3/uL (ref 0.0–0.7)
Eosinophils Relative: 1.6 % (ref 0.0–5.0)
HEMATOCRIT: 34.7 % — AB (ref 36.0–46.0)
HEMOGLOBIN: 11.6 g/dL — AB (ref 12.0–15.0)
LYMPHS PCT: 15 % (ref 12.0–46.0)
Lymphs Abs: 1 10*3/uL (ref 0.7–4.0)
MCHC: 33.3 g/dL (ref 30.0–36.0)
MCV: 86.3 fl (ref 78.0–100.0)
Monocytes Absolute: 0.7 10*3/uL (ref 0.1–1.0)
Monocytes Relative: 10.4 % (ref 3.0–12.0)
Neutro Abs: 4.9 10*3/uL (ref 1.4–7.7)
Neutrophils Relative %: 72.5 % (ref 43.0–77.0)
PLATELETS: 403 10*3/uL — AB (ref 150.0–400.0)
RBC: 4.02 Mil/uL (ref 3.87–5.11)
RDW: 16 % — AB (ref 11.5–15.5)
WBC: 6.7 10*3/uL (ref 4.0–10.5)

## 2016-05-16 LAB — CK TOTAL AND CKMB (NOT AT ARMC)
CK, MB: 1.6 ng/mL (ref 0.0–5.0)
RELATIVE INDEX: 1.9 (ref 0.0–4.0)
Total CK: 84 U/L (ref 7–177)

## 2016-05-16 LAB — BRAIN NATRIURETIC PEPTIDE: Pro B Natriuretic peptide (BNP): 300 pg/mL — ABNORMAL HIGH (ref 0.0–100.0)

## 2016-05-16 LAB — TROPONIN I: TNIDX: 0.03 ug/l (ref 0.00–0.06)

## 2016-05-16 MED ORDER — PREDNISONE 10 MG PO TABS
ORAL_TABLET | ORAL | 0 refills | Status: DC
Start: 2016-05-16 — End: 2016-06-11

## 2016-05-16 MED ORDER — GUAIFENESIN-CODEINE 100-10 MG/5ML PO SYRP: 5.0000 mL | ORAL_SOLUTION | Freq: Three times a day (TID) | ORAL | 0 refills | Status: DC | PRN

## 2016-05-16 MED ORDER — DOXYCYCLINE HYCLATE 100 MG PO TABS
100.0000 mg | ORAL_TABLET | Freq: Two times a day (BID) | ORAL | 0 refills | Status: DC
Start: 2016-05-16 — End: 2016-06-11

## 2016-05-16 MED ORDER — METHYLPREDNISOLONE ACETATE 40 MG/ML IJ SUSP
40.0000 mg | Freq: Once | INTRAMUSCULAR | Status: AC
  Administered 2016-05-16: 40 mg via INTRAMUSCULAR

## 2016-05-16 NOTE — Telephone Encounter (Signed)
Added to schedule.

## 2016-05-16 NOTE — Telephone Encounter (Signed)
Patient can come in for appointment at 1:00pm today. Can you add to schedule.

## 2016-05-16 NOTE — Telephone Encounter (Signed)
  Symptoms:no fever, weak, wheezing today, achy, coughing , drinking Gatorade   Duration  Since 05/10/16 Medications:ProAir, used nitroglycerin last Saturday , No appointments available please advise.

## 2016-05-16 NOTE — Telephone Encounter (Signed)
Can she come at 1:00 today?

## 2016-05-16 NOTE — Patient Instructions (Addendum)
You have a Lower respiratory infection.  This was caused by a virus.  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  You should start the prednisone taper tomorrow morning    start the doxycycline tonight and take it twice daily  with food.   cheratussin cough syrup every 8 hours as needed  If you become more short of breath ,  Call 911 or go to ER   . Use your albuterol nebulizer every 6 hours as needed

## 2016-05-16 NOTE — Telephone Encounter (Signed)
Pt called c/o of no appetite, coughing, wheezing, body aches, head congestion. No available appts. Please advise, thank you!  Call pt @ [phone]

## 2016-05-17 ENCOUNTER — Encounter: Payer: Self-pay | Admitting: Internal Medicine

## 2016-05-17 ENCOUNTER — Encounter: Payer: Self-pay | Admitting: Emergency Medicine

## 2016-05-17 DIAGNOSIS — I130 Hypertensive heart and chronic kidney disease with heart failure and stage 1 through stage 4 chronic kidney disease, or unspecified chronic kidney disease: Secondary | ICD-10-CM | POA: Insufficient documentation

## 2016-05-17 DIAGNOSIS — R112 Nausea with vomiting, unspecified: Secondary | ICD-10-CM | POA: Diagnosis not present

## 2016-05-17 DIAGNOSIS — Z79899 Other long term (current) drug therapy: Secondary | ICD-10-CM | POA: Diagnosis not present

## 2016-05-17 DIAGNOSIS — J209 Acute bronchitis, unspecified: Secondary | ICD-10-CM | POA: Insufficient documentation

## 2016-05-17 DIAGNOSIS — Z9861 Coronary angioplasty status: Secondary | ICD-10-CM | POA: Diagnosis not present

## 2016-05-17 DIAGNOSIS — R05 Cough: Secondary | ICD-10-CM | POA: Diagnosis present

## 2016-05-17 DIAGNOSIS — I2510 Atherosclerotic heart disease of native coronary artery without angina pectoris: Secondary | ICD-10-CM | POA: Insufficient documentation

## 2016-05-17 DIAGNOSIS — N189 Chronic kidney disease, unspecified: Secondary | ICD-10-CM | POA: Insufficient documentation

## 2016-05-17 DIAGNOSIS — J441 Chronic obstructive pulmonary disease with (acute) exacerbation: Secondary | ICD-10-CM | POA: Insufficient documentation

## 2016-05-17 DIAGNOSIS — E119 Type 2 diabetes mellitus without complications: Secondary | ICD-10-CM | POA: Diagnosis not present

## 2016-05-17 DIAGNOSIS — I5032 Chronic diastolic (congestive) heart failure: Secondary | ICD-10-CM | POA: Insufficient documentation

## 2016-05-17 DIAGNOSIS — Z7982 Long term (current) use of aspirin: Secondary | ICD-10-CM | POA: Diagnosis not present

## 2016-05-17 DIAGNOSIS — Z794 Long term (current) use of insulin: Secondary | ICD-10-CM | POA: Diagnosis not present

## 2016-05-17 DIAGNOSIS — J90 Pleural effusion, not elsewhere classified: Secondary | ICD-10-CM | POA: Diagnosis not present

## 2016-05-17 LAB — CBC WITH DIFFERENTIAL/PLATELET
Basophils Absolute: 0 10*3/uL (ref 0–0.1)
Basophils Relative: 0 %
EOS ABS: 0 10*3/uL (ref 0–0.7)
Eosinophils Relative: 0 %
HEMATOCRIT: 35 % (ref 35.0–47.0)
HEMOGLOBIN: 12.1 g/dL (ref 12.0–16.0)
Lymphocytes Relative: 10 %
Lymphs Abs: 0.7 10*3/uL — ABNORMAL LOW (ref 1.0–3.6)
MCH: 29.5 pg (ref 26.0–34.0)
MCHC: 34.5 g/dL (ref 32.0–36.0)
MCV: 85.7 fL (ref 80.0–100.0)
MONOS PCT: 4 %
Monocytes Absolute: 0.2 10*3/uL (ref 0.2–0.9)
NEUTROS ABS: 5.4 10*3/uL (ref 1.4–6.5)
NEUTROS PCT: 86 %
Platelets: 373 10*3/uL (ref 150–440)
RBC: 4.08 MIL/uL (ref 3.80–5.20)
RDW: 16 % — ABNORMAL HIGH (ref 11.5–14.5)
WBC: 6.3 10*3/uL (ref 3.6–11.0)

## 2016-05-17 LAB — POCT INFLUENZA A/B

## 2016-05-17 LAB — BASIC METABOLIC PANEL
Anion gap: 5 (ref 5–15)
BUN: 20 mg/dL (ref 6–20)
CHLORIDE: 105 mmol/L (ref 101–111)
CO2: 28 mmol/L (ref 22–32)
CREATININE: 1.25 mg/dL — AB (ref 0.44–1.00)
Calcium: 9.8 mg/dL (ref 8.9–10.3)
GFR calc non Af Amer: 41 mL/min — ABNORMAL LOW (ref >60)
GFR, EST AFRICAN AMERICAN: 47 mL/min — AB (ref >60)
Glucose, Bld: 275 mg/dL — ABNORMAL HIGH (ref 65–99)
POTASSIUM: 4.1 mmol/L (ref 3.5–5.1)
SODIUM: 138 mmol/L (ref 135–145)

## 2016-05-17 NOTE — Assessment & Plan Note (Signed)
treating with antibiotics and steroids given pulmonary disease (sarcoidosis)

## 2016-05-17 NOTE — ED Triage Notes (Signed)
Pt arrives to triage via [facility] with vomiting. Pt states that she vomited x2 today and that her former home health nurse recommended that she come in to the ER. Pt is in NAD at this time in triage.

## 2016-05-17 NOTE — ED Notes (Signed)
Patient to stat registration via wheelchair by EMS.  Reports pick patient up at home for vomiting.  States yesterday started doxycycline and prednisone and today started with the vomiting.  Patient reports that this happens when she starts new medications.

## 2016-05-17 NOTE — Progress Notes (Signed)
Subjective:  Patient ID: Pamela Collier, female    DOB: Feb 28, 1941  Age: 76 y.o. MRN: [id number]  CC: The primary encounter diagnosis was Cough due to bronchospasm. Diagnoses of Cough and Acute respiratory distress were also pertinent to this visit.  HPI Pamela Collier presents for evaluation of productive cough and dyspnea. She denies fever, weak, wheezing today, achy, coughing , drinking Gatorade.  Symptoms have been present since  saturday  05/10/16. Denies sinus congestion, headache and sore throat . No body aches.   Outpatient Medications Prior to Visit  Medication Sig Dispense Refill  . acetaminophen (TYLENOL) 500 MG tablet Take 500 mg by mouth every 6 (six) hours as needed for mild pain or headache.    . albuterol (PROAIR HFA) 108 (90 Base) MCG/ACT inhaler Inhale 2 puffs into the lungs every 6 (six) hours as needed for wheezing or shortness of breath. 1 Inhaler 2  . albuterol (PROVENTIL) (2.5 MG/3ML) 0.083% nebulizer solution Take 3 mLs (2.5 mg total) by nebulization every 6 (six) hours as needed for wheezing or shortness of breath. 150 mL 1  . alendronate (FOSAMAX) 70 MG tablet Take 70 mg by mouth once a week. Pt takes on Sunday.   Take with a full glass of water on an empty stomach.    . ALPRAZolam (XANAX) 0.5 MG tablet Take 0.5 mg by mouth daily.    [name] aspirin EC 81 MG tablet Take 81 mg by mouth daily.    . B-D UF III MINI PEN NEEDLES 31G X 5 MM MISC USE THREE TIMES A DAY 270 each 3  . benzonatate (TESSALON) 100 MG capsule Take 200 mg by mouth 3 (three) times daily as needed for cough. Reported on 11/09/2015    . BRILINTA 90 MG TABS tablet Take 1 tablet (90 mg total) by mouth 2 (two) times daily. 180 tablet 2  . budesonide-formoterol (SYMBICORT) 160-4.5 MCG/ACT inhaler Inhale 2 puffs into the lungs 2 (two) times daily. 1 Inhaler 0  . docusate sodium (COLACE) 100 MG capsule Take 100 mg by mouth 2 (two) times daily as needed for mild constipation. Reported on 11/09/2015    .  fluticasone (FLONASE) 50 MCG/ACT nasal spray Place 2 sprays into both nostrils daily as needed for rhinitis.    . furosemide (LASIX) 20 MG tablet Take 1 tablet (20 mg total) by mouth daily. 90 tablet 1  . glucose blood (ACCU-CHEK AVIVA PLUS) test strip USE TO CHECK GLUCOSE  THREE TIMES DAILY 300 each 3  . guaiFENesin-codeine (CHERATUSSIN AC) 100-10 MG/5ML syrup Take 5 mLs by mouth at bedtime as needed for cough. 120 mL 0  . hydrOXYzine (ATARAX/VISTARIL) 10 MG tablet Take 1 tablet (10 mg total) by mouth 3 (three) times daily as needed. 30 tablet 0  . Insulin Lispro Prot & Lispro (HUMALOG MIX 75/25 KWIKPEN) (75-25) 100 UNIT/ML Kwikpen Inject 45 to 60 units twice daily before morning and evening meals (Patient taking differently: Inject 44-60 Units into the skin. Inject 45 to 60 units twice daily before morning and evening meals) 112 mL 3  . Ipratropium-Albuterol (COMBIVENT RESPIMAT) 20-100 MCG/ACT AERS respimat Inhale 1 puff into the lungs 3 (three) times daily as needed for wheezing or shortness of breath. Reported on 10/25/2015    . isosorbide mononitrate (IMDUR) 30 MG 24 hr tablet TAKE 1 TABLET DAILY 90 tablet 1  . losartan (COZAAR) 100 MG tablet TAKE 1 TABLET DAILY 90 tablet 1  . metoprolol tartrate (LOPRESSOR) 25 MG tablet Take 0.5 tablets (  12.5 mg total) by mouth 2 (two) times daily.    . nitroGLYCERIN (NITROSTAT) 0.4 MG SL tablet Place 1 tablet (0.4 mg total) under the tongue every 5 (five) minutes as needed for chest pain. 25 tablet 3  . ondansetron (ZOFRAN) 8 MG tablet Take 1 tablet (8 mg total) by mouth every 8 (eight) hours as needed for nausea or vomiting. 15 tablet 0  . PARoxetine (PAXIL) 20 MG tablet TAKE ONE TABLET BY MOUTH ONCE DAILY 30 tablet 1  . polyethylene glycol (MIRALAX / GLYCOLAX) packet Take 17 g by mouth daily as needed for mild constipation. Reported on 10/25/2015    . pregabalin (LYRICA) 100 MG capsule Take 1 capsule (100 mg total) by mouth 3 (three) times daily. 270 capsule 2    . rosuvastatin (CRESTOR) 20 MG tablet Take 20 mg by mouth at bedtime.    . rosuvastatin (CRESTOR) 20 MG tablet TAKE ONE TABLET BY MOUTH ONCE DAILY 30 tablet 5  . traMADol (ULTRAM) 50 MG tablet Take 50 mg by mouth every 6 (six) hours as needed for moderate pain.    . Vitamin D, Ergocalciferol, (DRISDOL) 50000 units CAPS capsule Take 50,000 Units by mouth every 7 (seven) days. Pt takes on Sunday.     Facility-Administered Medications Prior to Visit  Medication Dose Route Frequency Provider Last Rate Last Dose  . albuterol (PROVENTIL) (5 MG/ML) 0.5% nebulizer solution 2.5 mg  2.5 mg Nebulization Once [name], MD        Review of Systems;  Patient denies headache, fevers, malaise, unintentional weight loss, skin rash, eye pain, sinus congestion and sinus pain, sore throat, dysphagia,  hemoptysis , chest pain, palpitations, orthopnea, edema, abdominal pain, nausea, melena, diarrhea, constipation, flank pain, dysuria, hematuria, urinary  Frequency, nocturia, numbness, tingling, seizures,  Focal weakness, Loss of consciousness,  Tremor, insomnia, depression, anxiety, and suicidal ideation.      Objective:  BP (!) 148/56   Pulse 61   Temp 98.7 F (37.1 C) (Oral)   Resp 16   Ht 5\' 7"  (1.702 m)   Wt 188 lb 2 oz (85.3 kg)   SpO2 97%   BMI 29.46 kg/m   BP Readings from Last 3 Encounters:  05/16/16 (!) 148/56  04/01/16 (!) 148/66  03/14/16 118/70    Wt Readings from Last 3 Encounters:  05/16/16 188 lb 2 oz (85.3 kg)  04/01/16 196 lb (88.9 kg)  03/14/16 188 lb (85.3 kg)    General appearance: alert, cooperative and appears stated age Ears: normal TM's and external ear canals both ears Throat: lips, mucosa, and tongue normal; teeth and gums normal Neck: no adenopathy, no carotid bruit, supple, symmetrical, trachea midline and thyroid not enlarged, symmetric, no tenderness/mass/nodules Back: symmetric, no curvature. ROM normal. No CVA tenderness. Lungs: decreased air movement,  with bilateral wheezing  Heart: regular rate and rhythm, S1, S2 normal, no murmur, click, rub or gallop Abdomen: soft, non-tender; bowel sounds normal; no masses,  no organomegaly Pulses: 2+ and symmetric Skin: Skin color, texture, turgor normal. No rashes or lesions Lymph nodes: Cervical, supraclavicular, and axillary nodes normal.  Lab Results  Component Value Date   HGBA1C 7.6 (H) 03/14/2016   HGBA1C 7.7 12/13/2015   HGBA1C 9.6 (H) 09/26/2015    Lab Results  Component Value Date   CREATININE 1.21 (H) 03/14/2016   CREATININE 1.25 (H) 01/08/2016   CREATININE 1.44 (H) 01/02/2016    Lab Results  Component Value Date   WBC 6.7 05/16/2016   HGB  11.6 (L) 05/16/2016   HCT 34.7 (L) 05/16/2016   PLT 403.0 (H) 05/16/2016   GLUCOSE 96 03/14/2016   CHOL 123 01/02/2016   TRIG 151 (H) 01/02/2016   HDL 29 (L) 01/02/2016   LDLDIRECT 102.0 03/14/2016   LDLCALC 64 01/02/2016   ALT 12 03/14/2016   AST 16 03/14/2016   NA 141 03/14/2016   K 4.0 03/14/2016   CL 106 03/14/2016   CREATININE 1.21 (H) 03/14/2016   BUN 22 03/14/2016   CO2 27 03/14/2016   TSH 1.16 07/01/2013   INR 1.2 (H) 02/02/2015   HGBA1C 7.6 (H) 03/14/2016   MICROALBUR 28.9 (H) 09/26/2015    Mm Digital Screening Bilateral  Result Date: 03/25/2016 CLINICAL DATA:  Screening. EXAM: DIGITAL SCREENING BILATERAL MAMMOGRAM WITH CAD COMPARISON:  Previous exam(s). ACR Breast Density Category b: There are scattered areas of fibroglandular density. FINDINGS: There are no findings suspicious for malignancy. Images were processed with CAD. IMPRESSION: No mammographic evidence of malignancy. A result letter of this screening mammogram will be mailed directly to the patient. RECOMMENDATION: Screening mammogram in one year. (Code:SM-B-01Y) BI-RADS CATEGORY  1: Negative. Electronically Signed   By: Dorise Bullion III M.D   On: 03/25/2016 13:42    Assessment & Plan:   Problem List Items Addressed This Visit    None    Visit  Diagnoses    Cough due to bronchospasm    -  Primary   Relevant Medications   methylPREDNISolone acetate (DEPO-MEDROL) injection 40 mg (Completed)   Other Relevant Orders   DG Chest 2 View (Completed)   CBC with Differential/Platelet (Completed)   Cough       Relevant Orders   POCT Influenza A/B   Acute respiratory distress       Relevant Orders   DG Chest 2 View (Completed)   Troponin I (Completed)   CK total and CKMB (cardiac)not at [facility] (Completed)   B Nat Peptide (Completed)     I am having Pamela Collier start on predniSONE, guaiFENesin-codeine, and doxycycline. I am also having her maintain her budesonide-formoterol, nitroGLYCERIN, BRILINTA, acetaminophen, alendronate, ALPRAZolam, benzonatate, Ipratropium-Albuterol, docusate sodium, Vitamin D (Ergocalciferol), fluticasone, rosuvastatin, polyethylene glycol, traMADol, aspirin EC, ondansetron, Insulin Lispro Prot & Lispro, losartan, furosemide, metoprolol tartrate, PARoxetine, hydrOXYzine, isosorbide mononitrate, B-D UF III MINI PEN NEEDLES, albuterol, guaiFENesin-codeine, glucose blood, pregabalin, albuterol, and rosuvastatin. We administered methylPREDNISolone acetate.  Meds ordered this encounter  Medications  . predniSONE (DELTASONE) 10 MG tablet    Sig: 6 tablets on Day 1 , then reduce by 1 tablet daily until gone    Dispense:  21 tablet    Refill:  0  . guaiFENesin-codeine (ROBITUSSIN AC) 100-10 MG/5ML syrup    Sig: Take 5 mLs by mouth 3 (three) times daily as needed for cough.    Dispense:  120 mL    Refill:  0  . doxycycline (VIBRA-TABS) 100 MG tablet    Sig: Take 1 tablet (100 mg total) by mouth 2 (two) times daily.    Dispense:  14 tablet    Refill:  0  . methylPREDNISolone acetate (DEPO-MEDROL) injection 40 mg    There are no discontinued medications.  Follow-up: No Follow-up on file.   [name], MD

## 2016-05-18 ENCOUNTER — Emergency Department
Admission: EM | Admit: 2016-05-18 | Discharge: 2016-05-18 | Disposition: A | Discharge status: Home / Self Care | Payer: Medicare Other | Attending: Emergency Medicine | Admitting: Emergency Medicine

## 2016-05-18 ENCOUNTER — Emergency Department: Payer: Medicare Other

## 2016-05-18 DIAGNOSIS — J441 Chronic obstructive pulmonary disease with (acute) exacerbation: Secondary | ICD-10-CM

## 2016-05-18 DIAGNOSIS — J90 Pleural effusion, not elsewhere classified: Secondary | ICD-10-CM | POA: Diagnosis not present

## 2016-05-18 LAB — URINALYSIS, COMPLETE (UACMP) WITH MICROSCOPIC
BILIRUBIN URINE: NEGATIVE
GLUCOSE, UA: 50 mg/dL — AB
KETONES UR: NEGATIVE mg/dL
LEUKOCYTES UA: NEGATIVE
NITRITE: NEGATIVE
PH: 5 (ref 5.0–8.0)
Protein, ur: >=300 mg/dL — AB
SPECIFIC GRAVITY, URINE: 1.015 (ref 1.005–1.030)

## 2016-05-18 LAB — TROPONIN I
Troponin I: 0.03 ng/mL (ref <0.03)
Troponin I: <0.03 ng/mL (ref <0.03)

## 2016-05-18 LAB — RAPID INFLUENZA A&B ANTIGENS
Influenza A (ARMC): NEGATIVE
Influenza B (ARMC): NEGATIVE

## 2016-05-18 IMAGING — CT CT CHEST W/O CM
2 of 3 series · 15 of 36 positions shown, 18 images · non-contrast
Comparison: Chest CTs ranging from 05/06/2012 through 11/14/2013.

CLINICAL DATA: Diagnosed with sarcoidosis 9 years ago. Follow-up
lymphadenopathy.

EXAM:
CT CHEST WITHOUT CONTRAST
TECHNIQUE: Multidetector CT imaging of the chest was performed following the
standard protocol without IV contrast..

[Series 2: routine chest wo · axial · 0.68mm/px · z∈[-282,-66]mm · 12 of 51 slices shown, 15 images]
[im 4/51  mediastinal]
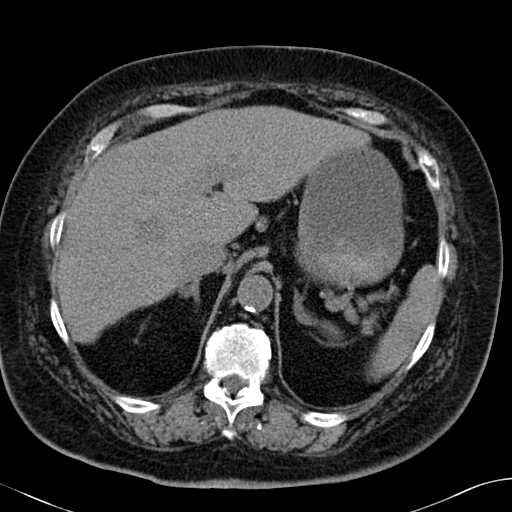
[im 4/51  lung]
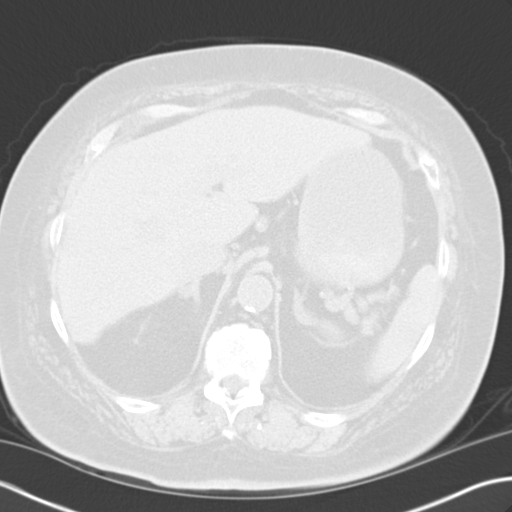
[im 8/51  lung]
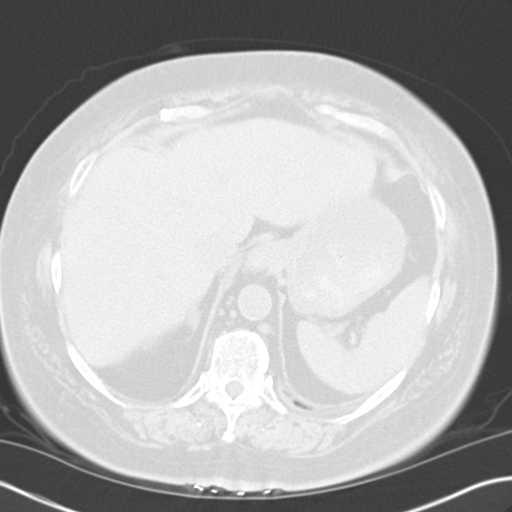
[im 12/51  lung]
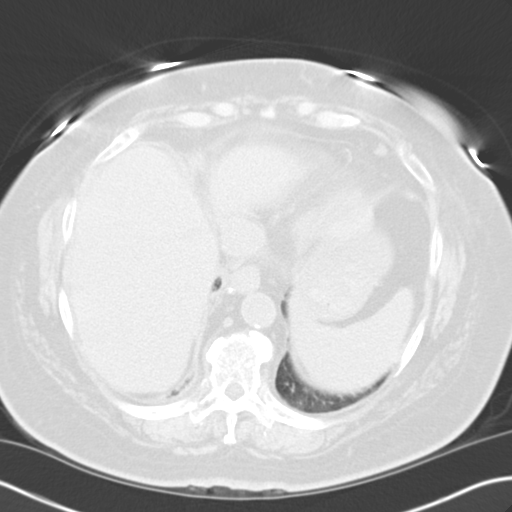
[im 15/51  lung]
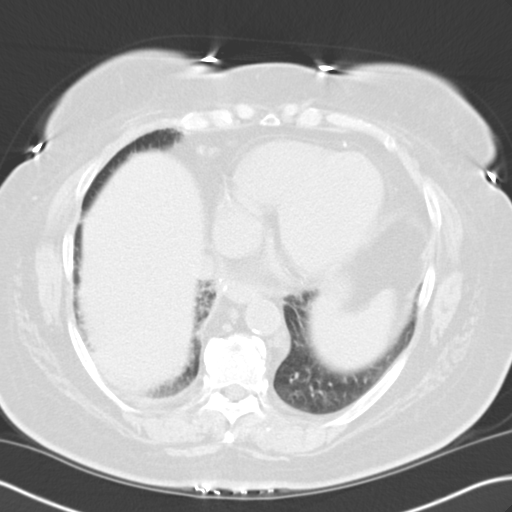
[im 19/51  mediastinal]
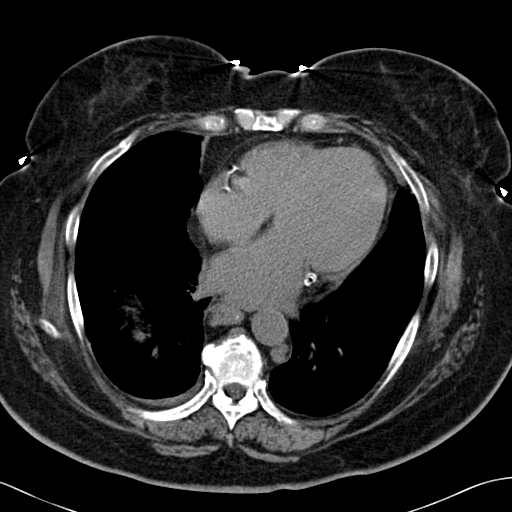
[im 19/51  lung]
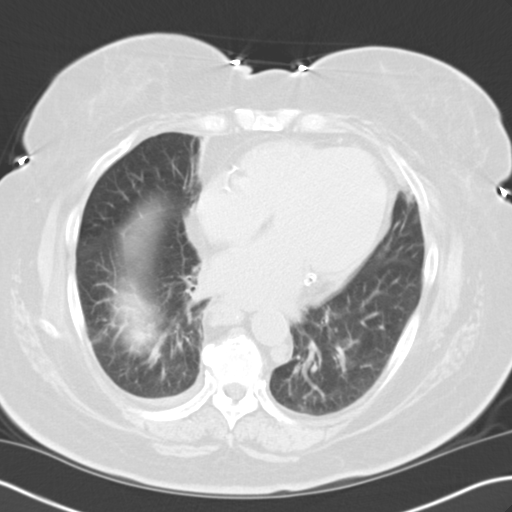
[im 23/51  lung]
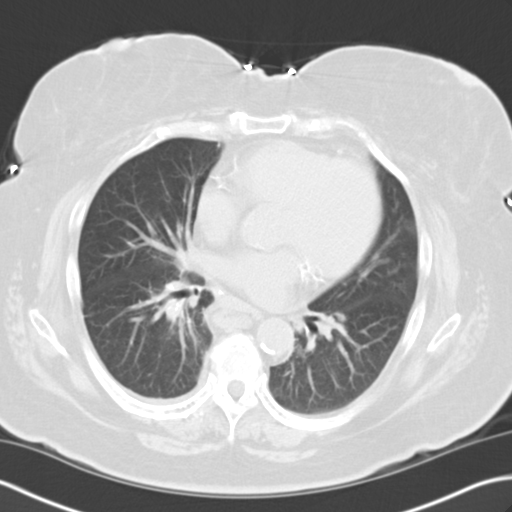
[im 28/51  lung]
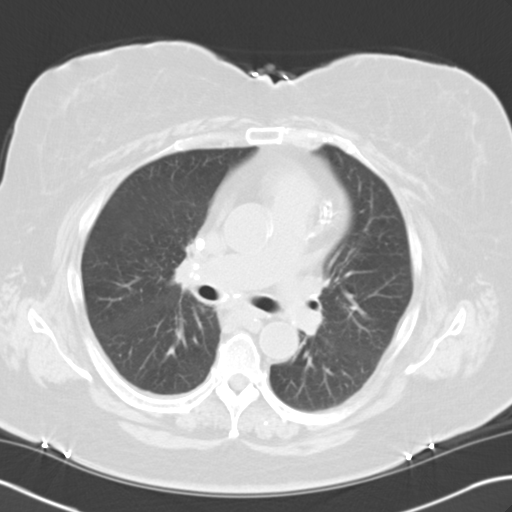
[im 32/51  lung]
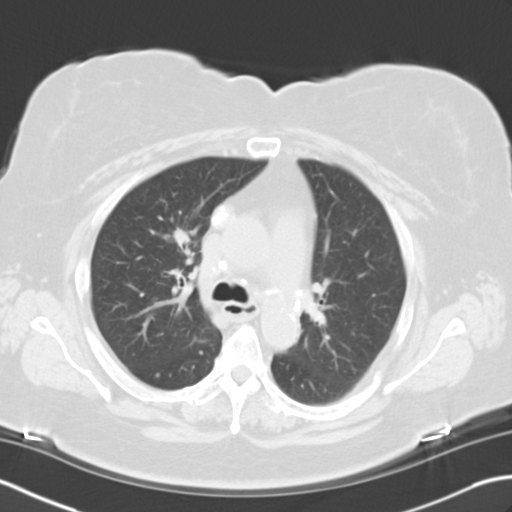
[im 36/51  mediastinal]
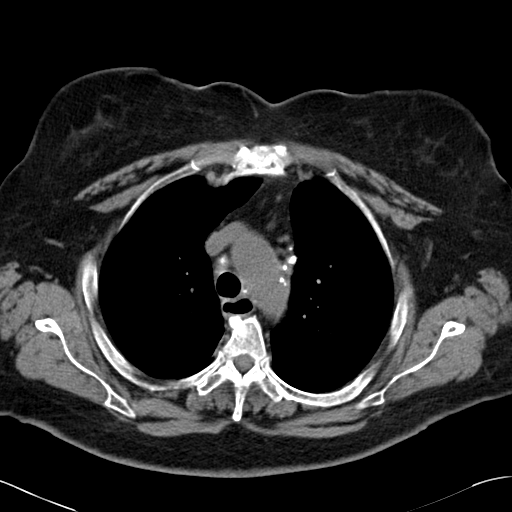
[im 36/51  lung]
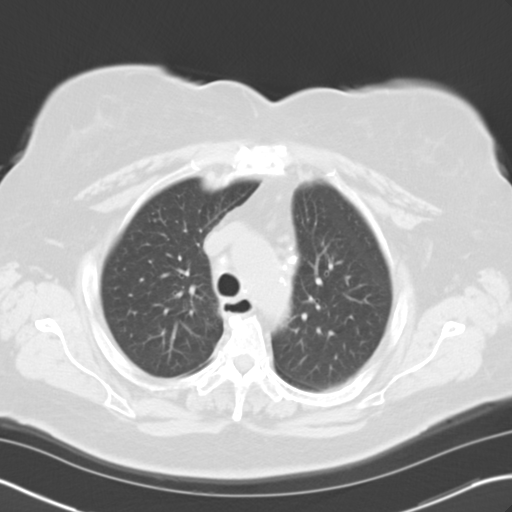
[im 39/51  lung]
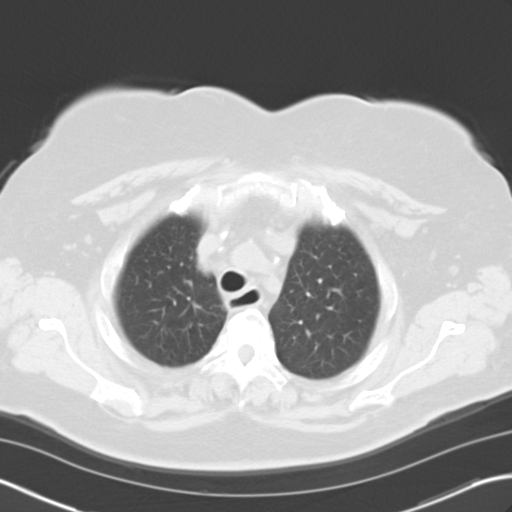
[im 43/51  lung]
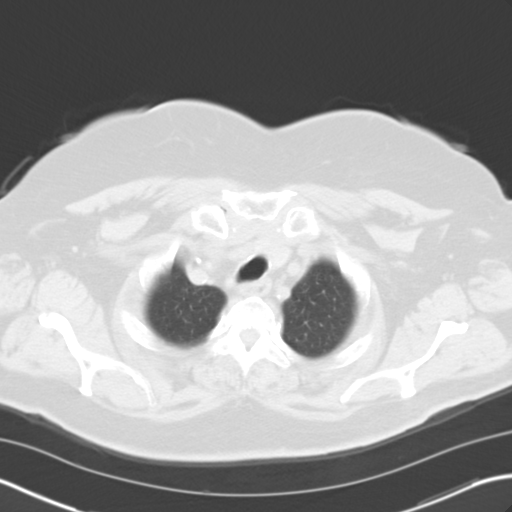
[im 47/51  lung]
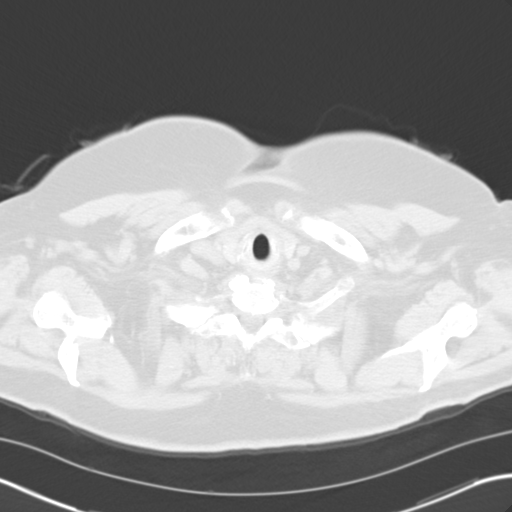

[Series 5: cor routine chest wo · coronal · 0.57mm/px · 3 of 144 slices shown]
[im 29/144  lung]
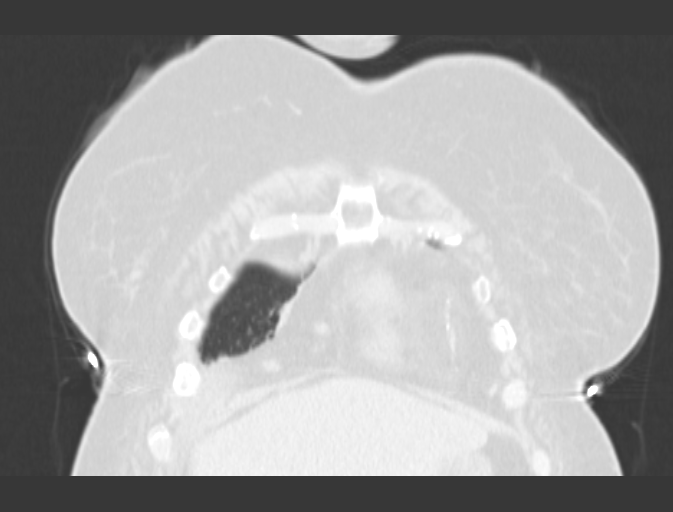
[im 58/144  lung]
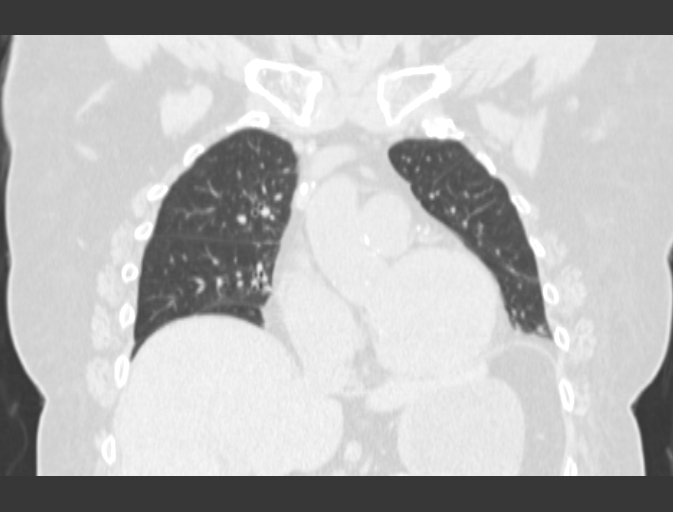
[im 86/144  lung]
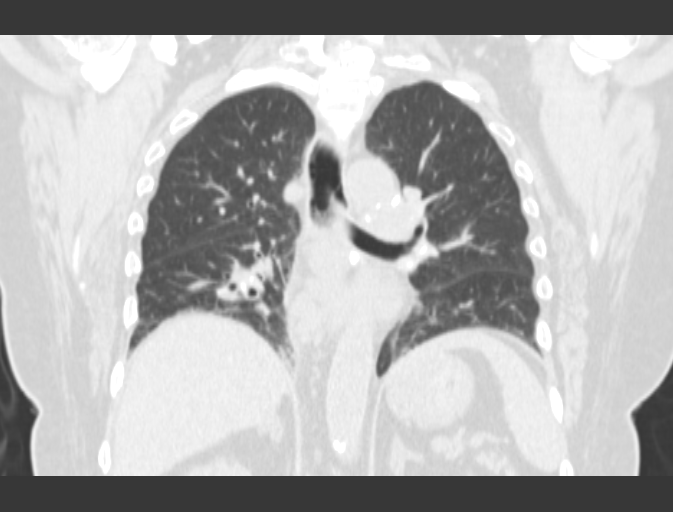

[15 of 36 positions shown; findings below may reference images not displayed]

FINDINGS: Mediastinum: Multiple partially calcified mediastinal and hilar
lymph nodes are stable from the baseline study. There are non
calcified paraesophageal nodes inferiorly which are also unchanged.
There is no axillary or internal mammary adenopathy. The thyroid
gland, trachea and esophagus demonstrate no significant findings.
The heart size is normal. Diffuse coronary artery calcifications are
noted. There is mild atherosclerosis of the aorta and great vessels.

Lungs/Pleura: The previously noted right pleural effusion has nearly
completely resolved. There is no significant left pleural or
pericardial effusion. There is interval improved aeration of the
right lower lobe. Right upper lobe 12 mm suprahilar nodule on image
number 20 it is stable from the baseline examination, possibly an
intrapulmonary node.There are no new or enlarging pulmonary nodules.

Upper abdomen: There is stable mild enlargement of both adrenal
glands. The visualized upper abdomen is otherwise unremarkable.

Musculoskeletal/Chest wall: No chest wall lesion or suspicious
osseous finding.
IMPRESSION: 1. Interval near-complete resolution of previously demonstrated
right pleural effusion with improved aeration of the right lung
base.
2. No acute chest findings.
3. Stable partially calcified mediastinal and hilar adenopathy
attributed to the history of sarcoidosis. Stable right upper lobe
pulmonary nodule, possibly an intrapulmonary lymph node.

## 2016-05-18 MED ORDER — IPRATROPIUM-ALBUTEROL 0.5-2.5 (3) MG/3ML IN SOLN
3.0000 mL | Freq: Once | RESPIRATORY_TRACT | Status: AC
  Administered 2016-05-18: 3 mL via RESPIRATORY_TRACT
  Filled 2016-05-18: qty 3

## 2016-05-18 MED ORDER — ONDANSETRON HCL 4 MG/2ML IJ SOLN
4.0000 mg | Freq: Once | INTRAMUSCULAR | Status: AC
  Administered 2016-05-18: 4 mg via INTRAVENOUS

## 2016-05-18 MED ORDER — METOPROLOL TARTRATE 25 MG PO TABS
12.5000 mg | ORAL_TABLET | Freq: Once | ORAL | Status: AC
  Administered 2016-05-18: 12.5 mg via ORAL
  Filled 2016-05-18: qty 1

## 2016-05-18 MED ORDER — ISOSORBIDE MONONITRATE ER 60 MG PO TB24
30.0000 mg | ORAL_TABLET | Freq: Every day | ORAL | Status: DC
  Administered 2016-05-18: 30 mg via ORAL
  Filled 2016-05-18: qty 1

## 2016-05-18 MED ORDER — ONDANSETRON HCL 4 MG/2ML IJ SOLN
INTRAMUSCULAR | Status: AC
  Administered 2016-05-18: 4 mg via INTRAVENOUS
  Filled 2016-05-18: qty 2

## 2016-05-18 MED ORDER — LOSARTAN POTASSIUM 50 MG PO TABS
100.0000 mg | ORAL_TABLET | Freq: Once | ORAL | Status: AC
  Administered 2016-05-18: 100 mg via ORAL
  Filled 2016-05-18: qty 2

## 2016-05-18 MED ORDER — ONDANSETRON HCL 4 MG PO TABS: 4.0000 mg | ORAL_TABLET | Freq: Three times a day (TID) | ORAL | 0 refills | Status: DC | PRN

## 2016-05-18 MED ORDER — DOXYCYCLINE HYCLATE 100 MG PO TABS
100.0000 mg | ORAL_TABLET | Freq: Once | ORAL | Status: AC
  Administered 2016-05-18: 100 mg via ORAL
  Filled 2016-05-18: qty 1

## 2016-05-18 MED ORDER — PREDNISONE 20 MG PO TABS
60.0000 mg | ORAL_TABLET | Freq: Once | ORAL | Status: AC
  Administered 2016-05-18: 60 mg via ORAL
  Filled 2016-05-18: qty 3

## 2016-05-18 NOTE — Discharge Instructions (Signed)
Continue prednisone and doxycycline as prescribed by your doctor. Use your albuterol inhaler 2 puffs every 4 hours for SOB. Follow up with your doctor in 1-2 days. Take zofran every 8 hours for nausea and vomiting. Do not take your morning lopressor, losartan and imdur as these medications have already been given to you in the ED.

## 2016-05-18 NOTE — ED Provider Notes (Signed)
[facility] Emergency Department Provider Note  ____________________________________________  Time seen: Approximately 5:04 AM  I have reviewed the triage vital signs and the nursing notes.   HISTORY  Chief Complaint Emesis   HPI CRESCENT GOTHAM is a 76 y.o. female history of sarcoidosis, CHF, CVA, COPD, CAD, diabetes, hypertension, hyperlipidemia, sarcoidosis, renal insufficiency who presents for evaluation of flulike symptoms. Patient reports 3 days of cough productive of yellow sputum, chills, shortness of breath, wheezing. She saw her primary care doctor yesterday she was given a shot of Decadron was started on doxycycline and albuterol. Patient then started to have vomiting and diarrhea. She endorses 3 episodes of nonbloody nonbilious emesis and one of diarrhea. She called her home health nurse who told her to come to the emergency room for evaluation. Patient denies fever. She denies chest pain at this time. She does endorse an episode of chest pain 2 days ago that was responsive to nitroglycerin. Patient denies weight gain and reports decreased appetite and 2 pound weight loss in the last few days. Vaccines are up to date including the flu shot.  Past Medical History:  Diagnosis Date  . Acute respiratory failure ([place]) 11/21/2014  . Arthritis 08/05/2013  . Atherosclerotic peripheral vascular disease with gangrene [facility]) august 2012  . Cardiomyopathy, ischemic    a. EF 35 to 40% by echo in 2013 b. EF improved to 50-55% by echo in 04/2015.  [name] Cerebral infarct ([place]) 08/17/2013  . Chronic diastolic CHF (congestive heart failure) (HCC)    a. EF 50-55% by echo in 04/2015  . COPD (chronic obstructive pulmonary disease) ([place])   . Coronary artery disease, occlusive    a. Previous PCI to the LAD, LCx, and RCA in 2010, 2011, 2013, and 2016. All performed in [place].  . 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 Diabetic diarrhea ([place]) 10/03/2014  . DM type 2, uncontrolled, with  renal complications ([place]) 0/10/3014  . Hepatic steatosis    by CT abd pelvis  . Hyperlipidemia LDL goal <100 02/23/2014  . Hypertensive heart disease   . Osteoporosis, post-menopausal   . 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 Pleural effusion 10/25/2012   10/2012 CT chest >> small to moderate R lung effusion>> chylothorax, 100% lymphs 10/2013 thoracentesis> cytology negative, WBC 1471, > 90% "small lymphs" 01/2014 CT chest> near complete resolution of pleural effusion, stable lymphadenopathy 08/2014 CT chest [place] ([facility]): small right sided effusion decreased in size, stable mediastinal lymphadenopathy 1.0cm largest, 17m  . 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 Renal insufficiency   . Sarcoidosis [facility])     Patient Active Problem List   Diagnosis Date Noted  . Acute bronchitis 05/17/2016  . Epigastric hernia 04/01/2016  . Umbilical pain 05/20/3233  . Chest pain 01/02/2016  . Coronary artery disease involving native coronary artery of native heart with unstable angina pectoris ([place])   . Type 2 diabetes mellitus with circulatory disorder, without long-term current use of insulin ([place])   . Chronic renal insufficiency   . Otitis media 12/23/2015  . Dehydration 10/28/2015  . Type 2 diabetes mellitus, uncontrolled, with neuropathy ([place])  09/27/2015  . Shortness of breath 09/12/2015  . Hypertensive heart disease   . Coronary artery disease, occlusive   . OSA on CPAP 07/07/2015  . Hospital discharge follow-up 07/07/2015  . Chest pain with low risk of acute coronary syndrome 06/29/2015  . Community acquired pneumonia   . Pleural effusion, right   .  Coronary artery disease involving native coronary artery of native heart with angina pectoris ([place])   . Diarrhea 05/15/2015  . Fall 04/19/2015  . Acute on chronic kidney failure ([place]) 04/19/2015  . Chronic diastolic CHF (congestive heart failure) ([place]) 04/18/2015  . Diabetic neuropathy ([place]) 02/21/2015  . Benign neoplasm of sigmoid colon   . Diabetic diarrhea ([place]) 10/03/2014  . Low back pain with radiation 04/04/2014  . Essential hypertension 04/04/2014  . Hyperlipidemia 02/23/2014  . Long-term use of high-risk medication 02/23/2014  . Dermatophytic onychia 08/17/2013  . Angina pectoris ([place]) 08/17/2013  . Arteriosclerosis of coronary artery 08/17/2013  . Cerebral infarct ([place]) 08/17/2013  . Bony exostosis 08/17/2013  . Arthritis 08/05/2013  . History of colonic polyps 02/20/2013  . Sarcoidosis ([place]) 12/07/2012  . Dysphagia 12/07/2012  . Nocturnal cough 10/25/2012  . Pulmonary nodule seen on imaging study 05/20/2012  . 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 Cardiomyopathy, ischemic   . Hepatic steatosis   . Osteoporosis, post-menopausal   . Peripheral vascular disease due to secondary diabetes mellitus ([place])   . DM (diabetes mellitus), type 2, uncontrolled, with renal complications ([place]) 42/35/3614  . Double vessel coronary artery disease 12/14/2011    Past Surgical History:  Procedure Laterality Date  . ABDOMINAL HYSTERECTOMY     at ge 39. secondary to bleeding/partial  . BREAST CYST ASPIRATION Right   . CARPAL TUNNEL RELEASE Bilateral   . CHOLECYSTECTOMY     in [place]   . COLONOSCOPY WITH PROPOFOL N/A 01/09/2015   Procedure: COLONOSCOPY WITH PROPOFOL;  Surgeon: Lucilla Lame, MD;  Location: [facility] ENDOSCOPY;  Service: Endoscopy;  Laterality: N/A;  . CORONARY ANGIOPLASTY WITH STENT PLACEMENT     [place]; [facility]  . HERNIA REPAIR     umbilical/ Dr Pat Patrick  . PTCA  August 2012   Right  Posterior tibial artery , [place]  . TOE AMPUTATION  Sept 2012   Right 2nd toe, [place]    Prior to Admission medications   Medication Sig Start Date End Date Taking? Authorizing Provider  acetaminophen (TYLENOL) 500 MG tablet Take 500 mg by mouth every 6 (six) hours as needed for mild pain or headache.    Historical Provider, MD  albuterol (PROAIR HFA) 108 (90 Base) MCG/ACT inhaler Inhale 2 puffs into the lungs every 6 (six) hours as needed for wheezing or shortness of breath. 02/26/16   [name], MD  albuterol (PROVENTIL) (2.5 MG/3ML) 0.083% nebulizer solution Take 3 mLs (2.5 mg total) by nebulization every 6 (six) hours as needed for wheezing or shortness of breath. 05/06/16   [name], MD  alendronate (FOSAMAX) 70 MG tablet Take 70 mg by mouth once a week. Pt takes on Sunday.   Take with a full glass of water on an empty stomach.    Historical Provider, MD  ALPRAZolam [name]) 0.5 MG tablet Take 0.5 mg by mouth daily.    Historical Provider, MD  aspirin EC 81 MG tablet Take 81 mg by mouth daily.    Historical Provider, MD  B-D UF III MINI PEN NEEDLES  31G X 5 MM MISC USE THREE TIMES A DAY 02/23/16   [name], MD  benzonatate (TESSALON) 100 MG capsule Take 200 mg by mouth 3 (three) times daily as needed for cough. Reported on 11/09/2015    Historical Provider, MD  BRILINTA 90 MG TABS tablet Take 1 tablet (90 mg total) by mouth 2 (two) times daily. 10/16/15   [name], MD  budesonide-formoterol (SYMBICORT) 160-4.5 MCG/ACT inhaler Inhale 2 puffs into the lungs 2 (two) times daily. 02/23/15   Juanito Doom, MD  docusate sodium (COLACE) 100 MG capsule Take 100 mg by mouth 2 (two) times daily as needed for mild constipation. Reported on 11/09/2015    Historical Provider, MD  doxycycline (VIBRA-TABS) 100 MG tablet Take 1 tablet (100 mg total) by mouth 2 (two) times daily. 05/16/16   [name], MD  fluticasone (FLONASE) 50 MCG/ACT nasal spray Place 2 sprays into both  nostrils daily as needed for rhinitis.    Historical Provider, MD  furosemide (LASIX) 20 MG tablet Take 1 tablet (20 mg total) by mouth daily. 01/02/16   Srikar Sudini, MD  glucose blood (ACCU-CHEK AVIVA PLUS) test strip USE TO CHECK GLUCOSE  THREE TIMES DAILY 04/29/16   [name], MD  guaiFENesin-codeine (CHERATUSSIN AC) 100-10 MG/5ML syrup Take 5 mLs by mouth at bedtime as needed for cough. 03/14/16   [name], MD  guaiFENesin-codeine (ROBITUSSIN AC) 100-10 MG/5ML syrup Take 5 mLs by mouth 3 (three) times daily as needed for cough. 05/16/16   [name], MD  hydrOXYzine (ATARAX/VISTARIL) 10 MG tablet Take 1 tablet (10 mg total) by mouth 3 (three) times daily as needed. 01/08/16   Ponciano Ort, MD  Insulin Lispro Prot & Lispro (HUMALOG MIX 75/25 KWIKPEN) (75-25) 100 UNIT/ML Kwikpen Inject 45 to 60 units twice daily before morning and evening meals Patient taking differently: Inject 44-60 Units into the skin. Inject 45 to 60 units twice daily before morning and evening meals 11/27/15   [name], MD  Ipratropium-Albuterol (COMBIVENT RESPIMAT) 20-100 MCG/ACT AERS respimat Inhale 1 puff into the lungs 3 (three) times daily as needed for wheezing or shortness of breath. Reported on 10/25/2015    Historical Provider, MD  isosorbide mononitrate (IMDUR) 30 MG 24 hr tablet TAKE 1 TABLET DAILY 01/17/16   [name], MD  losartan (COZAAR) 100 MG tablet TAKE 1 TABLET DAILY 12/19/15   [name], MD  metoprolol tartrate (LOPRESSOR) 25 MG tablet Take 0.5 tablets (12.5 mg total) by mouth 2 (two) times daily. 01/02/16   Hillary Bow, MD  nitroGLYCERIN (NITROSTAT) 0.4 MG SL tablet Place 1 tablet (0.4 mg total) under the tongue every 5 (five) minutes as needed for chest pain. 04/18/15   Minna Merritts, MD  ondansetron (ZOFRAN) 4 MG tablet Take 1 tablet (4 mg total) by mouth every 8 (eight) hours as needed for nausea or vomiting. 05/18/16   Rudene Re, MD  PARoxetine (PAXIL) 20 MG tablet TAKE  ONE TABLET BY MOUTH ONCE DAILY 01/03/16   [name], MD  polyethylene glycol (MIRALAX / GLYCOLAX) packet Take 17 g by mouth daily as needed for mild constipation. Reported on 10/25/2015    Historical Provider, MD  predniSONE (DELTASONE) 10 MG tablet 6 tablets on Day 1 , then reduce by 1 tablet daily until gone 05/16/16   [name], MD  pregabalin (LYRICA) 100 MG capsule Take 1 capsule (100 mg total) by mouth 3 (three) times  daily. 04/29/16   [name], MD  rosuvastatin (CRESTOR) 20 MG tablet Take 20 mg by mouth at bedtime.    Historical Provider, MD  rosuvastatin (CRESTOR) 20 MG tablet TAKE ONE TABLET BY MOUTH ONCE DAILY 05/13/16   [name], MD  traMADol (ULTRAM) 50 MG tablet Take 50 mg by mouth every 6 (six) hours as needed for moderate pain.    Historical Provider, MD  Vitamin D, Ergocalciferol, (DRISDOL) 50000 units CAPS capsule Take 50,000 Units by mouth every 7 (seven) days. Pt takes on Sunday.    Historical Provider, MD    Allergies Cephalexin; Contrast media [iodinated diagnostic agents]; Gabapentin; and Sulfa antibiotics  Family History  Problem Relation Age of Onset  . Heart disease Father   . Heart attack Father   . Heart disease Sister   . Heart attack Sister   . Heart disease Brother   . Heart attack Brother   . Asthma Grandchild   . Breast cancer Maternal Aunt     Social History Social History  Substance Use Topics  . Smoking status: Never Smoker  . Smokeless tobacco: Never Used  . Alcohol use No    Review of Systems  Constitutional: Negative for fever. + chills and decreased appetite Eyes: Negative for visual changes. ENT: Negative for sore throat. Neck: No neck pain  Cardiovascular: + chest pain. Respiratory: + shortness of breath and wheezing Gastrointestinal: Negative for abdominal pain. + vomiting and diarrhea. Genitourinary: Negative for dysuria. Musculoskeletal: Negative for back pain. Skin: Negative for rash. Neurological: Negative for  headaches, weakness or numbness. Psych: No SI or HI  ____________________________________________   PHYSICAL EXAM:  VITAL SIGNS: ED Triage Vitals  Enc Vitals Group     BP 05/17/16 2259 (!) 172/74     Pulse Rate 05/17/16 2259 71     Resp 05/17/16 2259 18     Temp 05/17/16 2259 98.5 F (36.9 C)     Temp Source 05/17/16 2259 Oral     SpO2 05/17/16 2259 99 %     Weight 05/17/16 2300 188 lb (85.3 kg)     Height 05/17/16 2300 5\' 7"  (1.702 m)     Head Circumference --      Peak Flow --      Pain Score 05/17/16 2301 5     Pain Loc --      Pain Edu? --      Excl. in [place]? --     Constitutional: Alert and oriented. Well appearing and in no apparent distress. HEENT:      Head: Normocephalic and atraumatic.         Eyes: Conjunctivae are normal. Sclera is non-icteric. EOMI. PERRL      Mouth/Throat: Mucous membranes are moist.       Neck: Supple with no signs of meningismus. Cardiovascular: Regular rate and rhythm. No murmurs, gallops, or rubs. 2+ symmetrical distal pulses are present in all extremities. No JVD. Respiratory: Normal work of breathing, normal sats, decreased air movement bilaterally with no crackles or wheezes.  Gastrointestinal: Soft, non tender, and non distended with positive bowel sounds. No rebound or guarding. Musculoskeletal: Trace pitting edema on b/l le Neurologic: Normal speech and language. Face is symmetric. Moving all extremities. No gross focal neurologic deficits are appreciated. Skin: Skin is warm, dry and intact. No rash noted. Psychiatric: Mood and affect are normal. Speech and behavior are normal.  ____________________________________________   LABS (all labs ordered are listed, but only abnormal results are displayed)  Labs Reviewed  CBC WITH DIFFERENTIAL/PLATELET - Abnormal; Notable for the following:       Result Value   RDW 16.0 (*)    Lymphs Abs 0.7 (*)    All other components within normal limits  BASIC METABOLIC PANEL - Abnormal; Notable  for the following:    Glucose, Bld 275 (*)    Creatinine, Ser 1.25 (*)    GFR calc non Af Amer 41 (*)    GFR calc Af Amer 47 (*)    All other components within normal limits  TROPONIN I - Abnormal; Notable for the following:    Troponin I 0.03 (*)    All other components within normal limits  URINALYSIS, COMPLETE (UACMP) WITH MICROSCOPIC - Abnormal; Notable for the following:    Color, Urine YELLOW (*)    APPearance HAZY (*)    Glucose, UA 50 (*)    Hgb urine dipstick SMALL (*)    Protein, ur >=300 (*)    Bacteria, UA RARE (*)    Squamous Epithelial / LPF 0-5 (*)    All other components within normal limits  RAPID INFLUENZA A&B ANTIGENS ([facility] ONLY)  TROPONIN I   ____________________________________________  EKG  ED ECG REPORT I, Rudene Re, the attending physician, personally viewed and interpreted this ECG.  Normal sinus rhythm, rate of 69, RBBB, LAD, no STE or depression. Unchanged from prior from 12/2015 ____________________________________________  RADIOLOGY  CXR:  Small right pleural effusion. Linear scarring or atelectasis in the right base. ____________________________________________   PROCEDURES  Procedure(s) performed: None Procedures Critical Care performed:  None ____________________________________________   INITIAL IMPRESSION / ASSESSMENT AND PLAN / ED COURSE  76 y.o. female history of sarcoidosis, CHF, CVA, COPD, CAD, diabetes, hypertension, hyperlipidemia, sarcoidosis, renal insufficiency who presents for evaluation of flulike symptoms x 3 days including productive cough, shortness of breath, wheezing, chills, vomiting and diarrhea. Patient currently on doxycycline since yesterday and prednisone. We'll give her a dose of these medications at this time, we'll give her do an abs for COPD exacerbation. We'll check for the flu. We'll repeat chest x-ray. We'll get an EKG and blood work including troponin and kidney function. Differential diagnoses  included pneumonia versus influenza versus CHF.  Clinical Course as of May 19 509  Sun May 18, 2016  3614 Patient reports that she feels markedly improved after 2 DuoNeb treatments. She is moving good air with no wheezing. Flu is negative. Blood work is within normal limits. Patient tolerating PO. Patient will be discharged home to continue doxycycline, prednisone, and albuterol and close f/u with PCP. Will provide patient with prescription for zofran.  [CV]    Clinical Course User Index [CV] Rudene Re, MD    Pertinent labs & imaging results that were available during my care of the patient were reviewed by me and considered in my medical decision making (see chart for details).    ____________________________________________   FINAL CLINICAL IMPRESSION(S) / ED DIAGNOSES  Final diagnoses:  COPD with acute exacerbation ([place])      NEW MEDICATIONS STARTED DURING THIS VISIT:  Discharge Medication List as of 05/18/2016  6:31 AM       Note:  This document was prepared using Dragon voice recognition software and may include unintentional dictation errors.    Rudene Re, MD 05/19/16 [phone]

## 2016-05-18 NOTE — ED Notes (Signed)
Pt's husband leaving, request call w/ updates, [place] [phone]

## 2016-05-19 ENCOUNTER — Telehealth: Payer: Self-pay | Admitting: Internal Medicine

## 2016-05-19 NOTE — Telephone Encounter (Signed)
From reading ED report patient was having COPD exacerbation , with Nausea and vomiting, only medication added from PCP visit was Zofran, patient cannot come in until Monday 05/26/16 , patient was advised to call office or return to ED if symptoms worsen. Patient also needs to reschedule wellness visit with Health Coach will forward .

## 2016-05-19 NOTE — Telephone Encounter (Signed)
FYI - Pt called and stated that she is ED on Saturday until Sunday morning, she was having reaction to some medication. The ED advised her to follow up with Dr. Derrel Nip on Tuesday but would like to wait until Monday 05/26/16. States that she is still feeling bad and wants to rest and does not want to come out with the bad weather coming. She would also like to cancel her appt with Denisa on 1/11 and schedule Dr. Derrel Nip and Dorthula Rue on the same day. Please advise, thank you!  Call pt @ [phone]

## 2016-05-22 ENCOUNTER — Ambulatory Visit: Payer: Medicare Other

## 2016-05-26 ENCOUNTER — Encounter: Payer: Self-pay | Admitting: Internal Medicine

## 2016-05-26 ENCOUNTER — Ambulatory Visit (INDEPENDENT_AMBULATORY_CARE_PROVIDER_SITE_OTHER): Payer: Medicare Other | Admitting: Internal Medicine

## 2016-05-26 DIAGNOSIS — I25110 Atherosclerotic heart disease of native coronary artery with unstable angina pectoris: Secondary | ICD-10-CM | POA: Diagnosis not present

## 2016-05-26 DIAGNOSIS — I2510 Atherosclerotic heart disease of native coronary artery without angina pectoris: Secondary | ICD-10-CM | POA: Diagnosis not present

## 2016-05-26 DIAGNOSIS — E1165 Type 2 diabetes mellitus with hyperglycemia: Secondary | ICD-10-CM

## 2016-05-26 DIAGNOSIS — E1151 Type 2 diabetes mellitus with diabetic peripheral angiopathy without gangrene: Secondary | ICD-10-CM | POA: Diagnosis not present

## 2016-05-26 DIAGNOSIS — J209 Acute bronchitis, unspecified: Secondary | ICD-10-CM

## 2016-05-26 DIAGNOSIS — IMO0002 Reserved for concepts with insufficient information to code with codable children: Secondary | ICD-10-CM

## 2016-05-26 NOTE — Progress Notes (Signed)
Diarrhea st Subjective:  Patient ID: Pamela Collier, female    DOB: February 25, 1941  Age: 76 y.o. 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  HPI Pamela Collier presents for ER follow up on recent adverse reaction to doxycycline.  she was treated in office  On Jan 5 for bronchitis with doxycycline, but developed recurrent nausea and vomiting, and one episode of chest pain that responded to nitroglycerin. She went to the ED and was treated with albuterol nebs , screened for flu and sent home with directions to continue prednisone, doxycycline, and zofran.  She continued taking the medication and developed  Itching , diffusely, which she treated with benadryl and finished the antibiotics.    Multipole other complaints.  Still feeling light headed when she first stands up,  Started having loose stools today .    Back hurts all the time   Type 2 DM:  Blood sugars dropping daily in the morning, today was 58 in a fasting state drop s . Takes 60 units in the evening.  Appetite has been off . Diet reviewed.  Not enough protein      Outpatient Medications Prior to Visit  Medication Sig Dispense Refill  . acetaminophen (TYLENOL) 500 MG tablet Take 500 mg by mouth every 6 (six) hours as needed for mild pain or headache.    . albuterol (PROAIR HFA) 108 (90 Base) MCG/ACT inhaler Inhale 2 puffs into the lungs every 6 (six) hours as needed for wheezing or shortness of breath. 1 Inhaler 2  . albuterol (PROVENTIL) (2.5 MG/3ML) 0.083% nebulizer solution Take 3 mLs (2.5 mg total) by nebulization every 6 (six) hours as needed for wheezing or shortness of breath. 150 mL 1  . alendronate (FOSAMAX) 70 MG tablet Take 70 mg by mouth once a week. Pt  takes on Sunday.   Take with a full glass of water on an empty stomach.    . ALPRAZolam (XANAX) 0.5 MG tablet Take 0.5 mg by mouth daily.    [name] aspirin EC 81 MG tablet Take 81 mg by mouth daily.    . B-D UF III MINI PEN NEEDLES 31G X 5 MM MISC USE THREE TIMES A DAY 270 each 3  . benzonatate (TESSALON) 100 MG capsule Take 200 mg by mouth 3 (three) times daily as needed for cough. Reported on 11/09/2015    . BRILINTA 90 MG TABS tablet Take 1 tablet (90 mg total) by mouth 2 (two) times daily. 180 tablet 2  . budesonide-formoterol (SYMBICORT) 160-4.5 MCG/ACT inhaler Inhale 2 puffs into the lungs 2 (two) times daily. 1 Inhaler 0  . docusate sodium (COLACE) 100 MG capsule Take 100 mg by mouth 2 (two) times daily as needed for mild constipation. Reported on 11/09/2015    . fluticasone (FLONASE) 50 MCG/ACT nasal spray Place 2 sprays into both nostrils daily as needed for rhinitis.    . furosemide (LASIX) 20 MG tablet Take 1 tablet (20 mg total) by mouth daily. 90 tablet 1  . glucose blood (ACCU-CHEK AVIVA PLUS) test strip USE TO CHECK GLUCOSE  THREE TIMES DAILY 300 each 3  . guaiFENesin-codeine (CHERATUSSIN AC) 100-10 MG/5ML syrup Take 5 mLs by mouth at bedtime as needed for cough. 120 mL 0  . guaiFENesin-codeine (ROBITUSSIN AC) 100-10 MG/5ML  syrup Take 5 mLs by mouth 3 (three) times daily as needed for cough. 120 mL 0  . hydrOXYzine (ATARAX/VISTARIL) 10 MG tablet Take 1 tablet (10 mg total) by mouth 3 (three) times daily as needed. 30 tablet 0  . Insulin Lispro Prot & Lispro (HUMALOG MIX 75/25 KWIKPEN) (75-25) 100 UNIT/ML Kwikpen Inject 45 to 60 units twice daily before morning and evening meals (Patient taking differently: Inject 44-60 Units into the skin. Inject 45 to 60 units twice daily before morning and evening meals) 112 mL 3  . Ipratropium-Albuterol (COMBIVENT RESPIMAT) 20-100 MCG/ACT AERS respimat Inhale 1 puff into the lungs 3 (three) times daily as needed for wheezing or shortness of breath.  Reported on 10/25/2015    . isosorbide mononitrate (IMDUR) 30 MG 24 hr tablet TAKE 1 TABLET DAILY 90 tablet 1  . losartan (COZAAR) 100 MG tablet TAKE 1 TABLET DAILY 90 tablet 1  . metoprolol tartrate (LOPRESSOR) 25 MG tablet Take 0.5 tablets (12.5 mg total) by mouth 2 (two) times daily.    . nitroGLYCERIN (NITROSTAT) 0.4 MG SL tablet Place 1 tablet (0.4 mg total) under the tongue every 5 (five) minutes as needed for chest pain. 25 tablet 3  . PARoxetine (PAXIL) 20 MG tablet TAKE ONE TABLET BY MOUTH ONCE DAILY 30 tablet 1  . polyethylene glycol (MIRALAX / GLYCOLAX) packet Take 17 g by mouth daily as needed for mild constipation. Reported on 10/25/2015    . pregabalin (LYRICA) 100 MG capsule Take 1 capsule (100 mg total) by mouth 3 (three) times daily. 270 capsule 2  . rosuvastatin (CRESTOR) 20 MG tablet Take 20 mg by mouth at bedtime.    . traMADol (ULTRAM) 50 MG tablet Take 50 mg by mouth every 6 (six) hours as needed for moderate pain.    . Vitamin D, Ergocalciferol, (DRISDOL) 50000 units CAPS capsule Take 50,000 Units by mouth every 7 (seven) days. Pt takes on Sunday.    . rosuvastatin (CRESTOR) 20 MG tablet TAKE ONE TABLET BY MOUTH ONCE DAILY 30 tablet 5  . doxycycline (VIBRA-TABS) 100 MG tablet Take 1 tablet (100 mg total) by mouth 2 (two) times daily. (Patient not taking: Reported on 05/26/2016) 14 tablet 0  . ondansetron (ZOFRAN) 4 MG tablet Take 1 tablet (4 mg total) by mouth every 8 (eight) hours as needed for nausea or vomiting. (Patient not taking: Reported on 05/26/2016) 20 tablet 0  . predniSONE (DELTASONE) 10 MG tablet 6 tablets on Day 1 , then reduce by 1 tablet daily until gone (Patient not taking: Reported on 05/26/2016) 21 tablet 0   Facility-Administered Medications Prior to Visit  Medication Dose Route Frequency Provider Last Rate Last Dose  . albuterol (PROVENTIL) (5 MG/ML) 0.5% nebulizer solution 2.5 mg  2.5 mg Nebulization Once [name], MD        Review of  Systems;  Patient denies headache, fevers, malaise, unintentional weight loss, skin rash, eye pain, sinus congestion and sinus pain, sore throat, dysphagia,  hemoptysis , cough, dyspnea, wheezing, chest pain, palpitations, orthopnea, edema, abdominal pain, nausea, melena, diarrhea, constipation, flank pain, dysuria, hematuria, urinary  Frequency, nocturia, numbness, tingling, seizures,  Focal weakness, Loss of consciousness,  Tremor, insomnia, depression, anxiety, and suicidal ideation.      Objective:  BP (!) 138/58   Pulse 63   Temp 98.8 F (37.1 C) (Oral)   Resp 15   Ht 5\' 7"  (1.702 m)   Wt 186 lb 12.8 oz (84.7 kg)   SpO2 97%  BMI 29.26 kg/m   BP Readings from Last 3 Encounters:  05/26/16 (!) 138/58  05/18/16 (!) 152/64  05/16/16 (!) 148/56    Wt Readings from Last 3 Encounters:  05/26/16 186 lb 12.8 oz (84.7 kg)  05/17/16 188 lb (85.3 kg)  05/16/16 188 lb 2 oz (85.3 kg)    General appearance: alert, cooperative and appears stated age Ears: normal TM's and external ear canals both ears Throat: lips, mucosa, and tongue normal; teeth and gums normal Neck: no adenopathy, no carotid bruit, supple, symmetrical, trachea midline and thyroid not enlarged, symmetric, no tenderness/mass/nodules Back: symmetric, no curvature. ROM normal. No CVA tenderness. Lungs: clear to auscultation bilaterally Heart: regular rate and rhythm, S1, S2 normal, no murmur, click, rub or gallop Abdomen: soft, non-tender; bowel sounds normal; no masses,  no organomegaly Pulses: 2+ and symmetric Skin: Skin color, texture, turgor normal. No rashes or lesions Lymph nodes: Cervical, supraclavicular, and axillary nodes normal.  Lab Results  Component Value Date   HGBA1C 7.6 (H) 03/14/2016   HGBA1C 7.7 12/13/2015   HGBA1C 9.6 (H) 09/26/2015    Lab Results  Component Value Date   CREATININE 1.25 (H) 05/17/2016   CREATININE 1.21 (H) 03/14/2016   CREATININE 1.25 (H) 01/08/2016    Lab Results   Component Value Date   WBC 6.3 05/17/2016   HGB 12.1 05/17/2016   HCT 35.0 05/17/2016   PLT 373 05/17/2016   GLUCOSE 275 (H) 05/17/2016   CHOL 123 01/02/2016   TRIG 151 (H) 01/02/2016   HDL 29 (L) 01/02/2016   LDLDIRECT 102.0 03/14/2016   LDLCALC 64 01/02/2016   ALT 12 03/14/2016   AST 16 03/14/2016   NA 138 05/17/2016   K 4.1 05/17/2016   CL 105 05/17/2016   CREATININE 1.25 (H) 05/17/2016   BUN 20 05/17/2016   CO2 28 05/17/2016   TSH 1.16 07/01/2013   INR 1.2 (H) 02/02/2015   HGBA1C 7.6 (H) 03/14/2016   MICROALBUR 28.9 (H) 09/26/2015    Dg Chest Portable 1 View  Result Date: 05/18/2016 CLINICAL DATA:  Dyspnea for over 2 weeks. EXAM: PORTABLE CHEST 1 VIEW COMPARISON:  05/16/2016 FINDINGS: Linear right base opacities may represent scarring or atelectasis. Linear scarring is visible in this area on the prior study of 05/16/2016. Probable small right pleural effusion. Left lung is clear. Pulmonary vasculature is normal. IMPRESSION: Small right pleural effusion. Linear scarring or atelectasis in the right base. Electronically Signed   By: Andreas Newport M.D.   On: 05/18/2016 06:44    Assessment & Plan:   Problem List Items Addressed This Visit    Acute bronchitis    Now clinically resolved.       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         A total of 25 minutes of face to face time was spent with patient more than half of which was spent in counselling about the above mentioned conditions  and coordination of care   I am having Pamela Collier maintain her budesonide-formoterol, nitroGLYCERIN, BRILINTA, acetaminophen, alendronate, ALPRAZolam, benzonatate, Ipratropium-Albuterol, docusate sodium,  Vitamin D (Ergocalciferol), fluticasone, rosuvastatin, polyethylene glycol, traMADol, aspirin EC, Insulin Lispro Prot & Lispro, losartan, furosemide, metoprolol tartrate, PARoxetine, hydrOXYzine, isosorbide mononitrate, B-D UF III MINI  PEN NEEDLES, albuterol, guaiFENesin-codeine, glucose blood, pregabalin, albuterol, predniSONE, guaiFENesin-codeine, doxycycline, and ondansetron.  No orders of the defined types were placed in this encounter.   Medications Discontinued During This Encounter  Medication Reason  . rosuvastatin (CRESTOR) 20 MG tablet Error    Follow-up: No Follow-up on file.   [name], MD

## 2016-05-26 NOTE — Progress Notes (Signed)
Pre visit review using our clinic review tool, if applicable. No additional management support is needed unless otherwise documented below in the visit note. 

## 2016-05-26 NOTE — Patient Instructions (Addendum)
Taking an antibiotic can create an imbalance in the normal population of bacteria that live in the small intestine.  This imbalance can persist for 3 months.   Taking a probiotic ( Align, Floraque or Culturelle), the generic version of one of these over the counter medications, or an alternative form (kombucha,  Yogurt, or another dietary source) for a minimum of 3 weeks may help prevent a serious antibiotic associated diarrhea  Called clostridium dificile colitis that occurs when the bacteria population is altered .     Drop your Evening dose of insulin to 50 units .  Bacons and eggs would be a better breakfast then cheerio's  You can eat apples ,  Cherries ,  All Berries,  Peaches ,  But not as a substitute for eggs.  Avoid the daily banana.    dannon lt n fit greek yogurt has only 8 carbs.   Much lower than yoplait etc   OR:   Danton Clap now makes a frozen breakfast frittata that can be microwaved in 2 minutes and is very low carb. Frittats are similar to quiches without the crust  For dinner,  Any grilled or baked meat/fish entree,  As long as it is not breaded,  Plus a green vegetable.  Sweet potatoes are better than white potatoes.  Green salad,  With regular dressing (not the fat free stuff,  Full of sugar)

## 2016-05-27 NOTE — Assessment & Plan Note (Signed)
Reducing her evening dose of insulin to avoid recurrent hypoglycemic events in the morning.

## 2016-05-27 NOTE — Assessment & Plan Note (Signed)
Now clinically resolved.

## 2016-06-03 NOTE — Telephone Encounter (Signed)
Ms Kuyper called on 02/21/16 and will return to [place] after her appointment with Dr Murriel Hopper 2 weeks. By Karie Fetch

## 2016-06-09 ENCOUNTER — Telehealth: Payer: Self-pay | Admitting: *Deleted

## 2016-06-09 NOTE — Telephone Encounter (Signed)
Pt currently has been taking breathing treatments 3 times a day. She reported that the treatments haven't been working effectively . Pt stated that when she's moving around she begins to have some wheezing. She requested to have a call to discuss a change in medication, or if she should be seen sooner. If pt needs to be seen sooner than 07/01/16 Please give a time and date to place pt.  Pt contact [phone]

## 2016-06-11 ENCOUNTER — Encounter: Payer: Self-pay | Admitting: Emergency Medicine

## 2016-06-11 ENCOUNTER — Emergency Department: Payer: Medicare Other

## 2016-06-11 ENCOUNTER — Inpatient Hospital Stay
Admission: EM | Admit: 2016-06-11 | Discharge: 2016-06-12 | DRG: 191 | Disposition: A | Discharge status: Home / Self Care | Payer: Medicare Other | Attending: Internal Medicine | Admitting: Internal Medicine

## 2016-06-11 ENCOUNTER — Other Ambulatory Visit: Payer: Self-pay | Admitting: *Deleted

## 2016-06-11 DIAGNOSIS — Z825 Family history of asthma and other chronic lower respiratory diseases: Secondary | ICD-10-CM | POA: Diagnosis not present

## 2016-06-11 DIAGNOSIS — Z803 Family history of malignant neoplasm of breast: Secondary | ICD-10-CM

## 2016-06-11 DIAGNOSIS — Z91041 Radiographic dye allergy status: Secondary | ICD-10-CM

## 2016-06-11 DIAGNOSIS — J441 Chronic obstructive pulmonary disease with (acute) exacerbation: Principal | ICD-10-CM | POA: Diagnosis present

## 2016-06-11 DIAGNOSIS — D860 Sarcoidosis of lung: Secondary | ICD-10-CM | POA: Diagnosis present

## 2016-06-11 DIAGNOSIS — E1129 Type 2 diabetes mellitus with other diabetic kidney complication: Secondary | ICD-10-CM | POA: Diagnosis present

## 2016-06-11 DIAGNOSIS — Z888 Allergy status to other drugs, medicaments and biological substances status: Secondary | ICD-10-CM

## 2016-06-11 DIAGNOSIS — M810 Age-related osteoporosis without current pathological fracture: Secondary | ICD-10-CM | POA: Diagnosis present

## 2016-06-11 DIAGNOSIS — Z955 Presence of coronary angioplasty implant and graft: Secondary | ICD-10-CM | POA: Diagnosis not present

## 2016-06-11 DIAGNOSIS — Z794 Long term (current) use of insulin: Secondary | ICD-10-CM

## 2016-06-11 DIAGNOSIS — J189 Pneumonia, unspecified organism: Secondary | ICD-10-CM

## 2016-06-11 DIAGNOSIS — J209 Acute bronchitis, unspecified: Secondary | ICD-10-CM | POA: Diagnosis present

## 2016-06-11 DIAGNOSIS — E1151 Type 2 diabetes mellitus with diabetic peripheral angiopathy without gangrene: Secondary | ICD-10-CM | POA: Diagnosis present

## 2016-06-11 DIAGNOSIS — K760 Fatty (change of) liver, not elsewhere classified: Secondary | ICD-10-CM | POA: Diagnosis present

## 2016-06-11 DIAGNOSIS — Z7983 Long term (current) use of bisphosphonates: Secondary | ICD-10-CM

## 2016-06-11 DIAGNOSIS — R0602 Shortness of breath: Secondary | ICD-10-CM | POA: Diagnosis not present

## 2016-06-11 DIAGNOSIS — Z90710 Acquired absence of both cervix and uterus: Secondary | ICD-10-CM | POA: Diagnosis not present

## 2016-06-11 DIAGNOSIS — Z7982 Long term (current) use of aspirin: Secondary | ICD-10-CM

## 2016-06-11 DIAGNOSIS — Z8673 Personal history of transient ischemic attack (TIA), and cerebral infarction without residual deficits: Secondary | ICD-10-CM

## 2016-06-11 DIAGNOSIS — G4733 Obstructive sleep apnea (adult) (pediatric): Secondary | ICD-10-CM | POA: Diagnosis present

## 2016-06-11 DIAGNOSIS — Z8249 Family history of ischemic heart disease and other diseases of the circulatory system: Secondary | ICD-10-CM

## 2016-06-11 DIAGNOSIS — M1990 Unspecified osteoarthritis, unspecified site: Secondary | ICD-10-CM | POA: Diagnosis present

## 2016-06-11 DIAGNOSIS — J440 Chronic obstructive pulmonary disease with acute lower respiratory infection: Secondary | ICD-10-CM | POA: Diagnosis present

## 2016-06-11 DIAGNOSIS — E1140 Type 2 diabetes mellitus with diabetic neuropathy, unspecified: Secondary | ICD-10-CM | POA: Diagnosis present

## 2016-06-11 DIAGNOSIS — Z881 Allergy status to other antibiotic agents status: Secondary | ICD-10-CM

## 2016-06-11 DIAGNOSIS — I5030 Unspecified diastolic (congestive) heart failure: Secondary | ICD-10-CM | POA: Diagnosis not present

## 2016-06-11 DIAGNOSIS — Z7951 Long term (current) use of inhaled steroids: Secondary | ICD-10-CM

## 2016-06-11 DIAGNOSIS — I5032 Chronic diastolic (congestive) heart failure: Secondary | ICD-10-CM | POA: Diagnosis present

## 2016-06-11 DIAGNOSIS — I2510 Atherosclerotic heart disease of native coronary artery without angina pectoris: Secondary | ICD-10-CM | POA: Diagnosis present

## 2016-06-11 DIAGNOSIS — Z882 Allergy status to sulfonamides status: Secondary | ICD-10-CM

## 2016-06-11 DIAGNOSIS — E785 Hyperlipidemia, unspecified: Secondary | ICD-10-CM | POA: Diagnosis present

## 2016-06-11 DIAGNOSIS — D649 Anemia, unspecified: Secondary | ICD-10-CM | POA: Diagnosis present

## 2016-06-11 DIAGNOSIS — I110 Hypertensive heart disease with heart failure: Secondary | ICD-10-CM | POA: Diagnosis present

## 2016-06-11 DIAGNOSIS — I255 Ischemic cardiomyopathy: Secondary | ICD-10-CM | POA: Diagnosis present

## 2016-06-11 DIAGNOSIS — I10 Essential (primary) hypertension: Secondary | ICD-10-CM | POA: Diagnosis not present

## 2016-06-11 DIAGNOSIS — J449 Chronic obstructive pulmonary disease, unspecified: Secondary | ICD-10-CM | POA: Diagnosis not present

## 2016-06-11 DIAGNOSIS — R05 Cough: Secondary | ICD-10-CM | POA: Diagnosis not present

## 2016-06-11 LAB — BASIC METABOLIC PANEL
Anion gap: 5 (ref 5–15)
BUN: 12 mg/dL (ref 6–20)
CHLORIDE: 107 mmol/L (ref 101–111)
CO2: 27 mmol/L (ref 22–32)
CREATININE: 1.11 mg/dL — AB (ref 0.44–1.00)
Calcium: 8.6 mg/dL — ABNORMAL LOW (ref 8.9–10.3)
GFR calc Af Amer: 54 mL/min — ABNORMAL LOW (ref >60)
GFR calc non Af Amer: 47 mL/min — ABNORMAL LOW (ref >60)
GLUCOSE: 107 mg/dL — AB (ref 65–99)
Potassium: 3.7 mmol/L (ref 3.5–5.1)
SODIUM: 139 mmol/L (ref 135–145)

## 2016-06-11 LAB — CBC
HCT: 32.4 % — ABNORMAL LOW (ref 35.0–47.0)
Hemoglobin: 10.9 g/dL — ABNORMAL LOW (ref 12.0–16.0)
MCH: 28.8 pg (ref 26.0–34.0)
MCHC: 33.7 g/dL (ref 32.0–36.0)
MCV: 85.7 fL (ref 80.0–100.0)
Platelets: 335 10*3/uL (ref 150–440)
RBC: 3.78 MIL/uL — ABNORMAL LOW (ref 3.80–5.20)
RDW: 16.1 % — ABNORMAL HIGH (ref 11.5–14.5)
WBC: 6 10*3/uL (ref 3.6–11.0)

## 2016-06-11 LAB — INFLUENZA PANEL BY PCR (TYPE A & B)
Influenza A By PCR: NEGATIVE
Influenza B By PCR: NEGATIVE

## 2016-06-11 LAB — GLUCOSE, CAPILLARY
Glucose-Capillary: 88 mg/dL (ref 65–99)
Glucose-Capillary: 280 mg/dL — ABNORMAL HIGH (ref 65–99)
Glucose-Capillary: 366 mg/dL — ABNORMAL HIGH (ref 65–99)

## 2016-06-11 LAB — TROPONIN I: TROPONIN I: 0.03 ng/mL — AB (ref <0.03)

## 2016-06-11 LAB — BRAIN NATRIURETIC PEPTIDE: B Natriuretic Peptide: 362 pg/mL — ABNORMAL HIGH (ref 0.0–100.0)

## 2016-06-11 MED ORDER — FUROSEMIDE 20 MG PO TABS: 20.0000 mg | ORAL_TABLET | Freq: Every day | ORAL | 1 refills | Status: DC

## 2016-06-11 MED ORDER — INSULIN ASPART 100 UNIT/ML ~~LOC~~ SOLN
SUBCUTANEOUS | Status: AC
  Filled 2016-06-11: qty 15

## 2016-06-11 MED ORDER — IPRATROPIUM-ALBUTEROL 0.5-2.5 (3) MG/3ML IN SOLN
3.0000 mL | Freq: Once | RESPIRATORY_TRACT | Status: AC
  Administered 2016-06-11: 3 mL via RESPIRATORY_TRACT
  Filled 2016-06-11: qty 3

## 2016-06-11 MED ORDER — SODIUM CHLORIDE 0.9 % IV SOLN
INTRAVENOUS | Status: DC
  Administered 2016-06-11: 1000 mL via INTRAVENOUS

## 2016-06-11 MED ORDER — LEVOFLOXACIN 750 MG PO TABS
750.0000 mg | ORAL_TABLET | ORAL | Status: DC
  Administered 2016-06-11: 750 mg via ORAL
  Filled 2016-06-11: qty 1

## 2016-06-11 MED ORDER — ONDANSETRON HCL 4 MG PO TABS: 4.0000 mg | ORAL_TABLET | Freq: Three times a day (TID) | ORAL | Status: DC | PRN

## 2016-06-11 MED ORDER — INSULIN ASPART 100 UNIT/ML ~~LOC~~ SOLN
0.0000 [IU] | Freq: Three times a day (TID) | SUBCUTANEOUS | Status: DC
  Administered 2016-06-11: 15 [IU] via SUBCUTANEOUS
  Administered 2016-06-12 (×2): 8 [IU] via SUBCUTANEOUS
  Filled 2016-06-11 (×2): qty 8

## 2016-06-11 MED ORDER — FUROSEMIDE 20 MG PO TABS
20.0000 mg | ORAL_TABLET | Freq: Every day | ORAL | Status: DC
  Administered 2016-06-11 – 2016-06-12 (×2): 20 mg via ORAL
  Filled 2016-06-11 (×2): qty 1

## 2016-06-11 MED ORDER — NITROGLYCERIN 0.4 MG SL SUBL: 0.4000 mg | SUBLINGUAL_TABLET | SUBLINGUAL | Status: DC | PRN

## 2016-06-11 MED ORDER — ROSUVASTATIN CALCIUM 20 MG PO TABS
20.0000 mg | ORAL_TABLET | Freq: Every day | ORAL | Status: DC
  Administered 2016-06-11: 20 mg via ORAL
  Filled 2016-06-11: qty 1

## 2016-06-11 MED ORDER — MOMETASONE FURO-FORMOTEROL FUM 200-5 MCG/ACT IN AERO
2.0000 | INHALATION_SPRAY | Freq: Two times a day (BID) | RESPIRATORY_TRACT | Status: DC
  Administered 2016-06-11 – 2016-06-12 (×2): 2 via RESPIRATORY_TRACT
  Filled 2016-06-11: qty 8.8

## 2016-06-11 MED ORDER — SODIUM CHLORIDE 0.9 % IV BOLUS (SEPSIS): 1000.0000 mL | Freq: Once | INTRAVENOUS | Status: DC

## 2016-06-11 MED ORDER — LOSARTAN POTASSIUM 50 MG PO TABS
100.0000 mg | ORAL_TABLET | Freq: Every day | ORAL | Status: DC
  Administered 2016-06-11 – 2016-06-12 (×2): 100 mg via ORAL
  Filled 2016-06-11 (×2): qty 2

## 2016-06-11 MED ORDER — ONDANSETRON HCL 4 MG/2ML IJ SOLN
4.0000 mg | Freq: Once | INTRAMUSCULAR | Status: AC
  Administered 2016-06-11: 4 mg via INTRAVENOUS
  Filled 2016-06-11: qty 2

## 2016-06-11 MED ORDER — FLUTICASONE PROPIONATE 50 MCG/ACT NA SUSP
2.0000 | Freq: Every day | NASAL | Status: DC | PRN
  Filled 2016-06-11: qty 16

## 2016-06-11 MED ORDER — METHYLPREDNISOLONE SODIUM SUCC 125 MG IJ SOLR
60.0000 mg | Freq: Four times a day (QID) | INTRAMUSCULAR | Status: DC
  Administered 2016-06-11 – 2016-06-12 (×3): 60 mg via INTRAVENOUS
  Filled 2016-06-11 (×3): qty 2

## 2016-06-11 MED ORDER — BENZONATATE 100 MG PO CAPS
200.0000 mg | ORAL_CAPSULE | Freq: Three times a day (TID) | ORAL | Status: DC | PRN
  Administered 2016-06-12: 09:00:00 200 mg via ORAL
  Filled 2016-06-11: qty 2

## 2016-06-11 MED ORDER — AMOXICILLIN-POT CLAVULANATE 875-125 MG PO TABS
1.0000 | ORAL_TABLET | Freq: Once | ORAL | Status: AC
  Administered 2016-06-11: 1 via ORAL
  Filled 2016-06-11: qty 1

## 2016-06-11 MED ORDER — ACETAMINOPHEN 500 MG PO TABS
1000.0000 mg | ORAL_TABLET | Freq: Once | ORAL | Status: AC
  Administered 2016-06-11: 1000 mg via ORAL
  Filled 2016-06-11: qty 2

## 2016-06-11 MED ORDER — TRAMADOL HCL 50 MG PO TABS: 50.0000 mg | ORAL_TABLET | Freq: Four times a day (QID) | ORAL | Status: DC | PRN

## 2016-06-11 MED ORDER — ENOXAPARIN SODIUM 40 MG/0.4ML ~~LOC~~ SOLN
40.0000 mg | SUBCUTANEOUS | Status: DC
  Administered 2016-06-11: 40 mg via SUBCUTANEOUS
  Filled 2016-06-11: qty 0.4

## 2016-06-11 MED ORDER — DOCUSATE SODIUM 100 MG PO CAPS: 100.0000 mg | ORAL_CAPSULE | Freq: Two times a day (BID) | ORAL | Status: DC | PRN

## 2016-06-11 MED ORDER — TICAGRELOR 90 MG PO TABS
90.0000 mg | ORAL_TABLET | Freq: Two times a day (BID) | ORAL | Status: DC
  Administered 2016-06-11 – 2016-06-12 (×2): 90 mg via ORAL
  Filled 2016-06-11 (×3): qty 1

## 2016-06-11 MED ORDER — PAROXETINE HCL 10 MG PO TABS
20.0000 mg | ORAL_TABLET | Freq: Every day | ORAL | Status: DC
  Administered 2016-06-11 – 2016-06-12 (×2): 20 mg via ORAL
  Filled 2016-06-11: qty 2
  Filled 2016-06-11: qty 1

## 2016-06-11 MED ORDER — METOPROLOL TARTRATE 25 MG PO TABS
25.0000 mg | ORAL_TABLET | Freq: Two times a day (BID) | ORAL | Status: DC
  Administered 2016-06-11 – 2016-06-12 (×2): 25 mg via ORAL
  Filled 2016-06-11 (×2): qty 1

## 2016-06-11 MED ORDER — FUROSEMIDE 10 MG/ML IJ SOLN
20.0000 mg | Freq: Once | INTRAMUSCULAR | Status: AC
  Administered 2016-06-11: 20 mg via INTRAVENOUS
  Filled 2016-06-11: qty 4

## 2016-06-11 MED ORDER — METHYLPREDNISOLONE SODIUM SUCC 125 MG IJ SOLR
125.0000 mg | Freq: Once | INTRAMUSCULAR | Status: AC
  Administered 2016-06-11: 125 mg via INTRAVENOUS
  Filled 2016-06-11: qty 2

## 2016-06-11 MED ORDER — ISOSORBIDE MONONITRATE ER 60 MG PO TB24
30.0000 mg | ORAL_TABLET | Freq: Every day | ORAL | Status: DC
  Administered 2016-06-11 – 2016-06-12 (×2): 30 mg via ORAL
  Filled 2016-06-11 (×2): qty 1

## 2016-06-11 MED ORDER — POLYETHYLENE GLYCOL 3350 17 G PO PACK: 17.0000 g | PACK | Freq: Every day | ORAL | Status: DC | PRN

## 2016-06-11 MED ORDER — DOXYCYCLINE HYCLATE 100 MG PO TABS
100.0000 mg | ORAL_TABLET | Freq: Once | ORAL | Status: AC
  Administered 2016-06-11: 100 mg via ORAL
  Filled 2016-06-11: qty 1

## 2016-06-11 MED ORDER — PREGABALIN 50 MG PO CAPS
100.0000 mg | ORAL_CAPSULE | Freq: Three times a day (TID) | ORAL | Status: DC
  Administered 2016-06-11 – 2016-06-12 (×2): 100 mg via ORAL
  Filled 2016-06-11 (×2): qty 2

## 2016-06-11 MED ORDER — ASPIRIN EC 81 MG PO TBEC
81.0000 mg | DELAYED_RELEASE_TABLET | Freq: Every day | ORAL | Status: DC
  Administered 2016-06-11 – 2016-06-12 (×2): 81 mg via ORAL
  Filled 2016-06-11 (×2): qty 1

## 2016-06-11 MED ORDER — ACETAMINOPHEN 325 MG PO TABS: 650.0000 mg | ORAL_TABLET | Freq: Four times a day (QID) | ORAL | Status: DC | PRN

## 2016-06-11 MED ORDER — TRAZODONE HCL 50 MG PO TABS
50.0000 mg | ORAL_TABLET | Freq: Every evening | ORAL | Status: DC | PRN
  Administered 2016-06-11: 50 mg via ORAL
  Filled 2016-06-11: qty 1

## 2016-06-11 MED ORDER — METOPROLOL TARTRATE 25 MG PO TABS: 12.5000 mg | ORAL_TABLET | Freq: Two times a day (BID) | ORAL | 1 refills | Status: DC

## 2016-06-11 MED ORDER — IPRATROPIUM-ALBUTEROL 0.5-2.5 (3) MG/3ML IN SOLN: 3.0000 mL | Freq: Four times a day (QID) | RESPIRATORY_TRACT | Status: DC | PRN

## 2016-06-11 MED ORDER — ALBUTEROL SULFATE (2.5 MG/3ML) 0.083% IN NEBU
5.0000 mg | INHALATION_SOLUTION | Freq: Once | RESPIRATORY_TRACT | Status: AC
  Administered 2016-06-11: 5 mg via RESPIRATORY_TRACT
  Filled 2016-06-11: qty 6

## 2016-06-11 MED ORDER — INSULIN ASPART 100 UNIT/ML ~~LOC~~ SOLN
0.0000 [IU] | Freq: Every day | SUBCUTANEOUS | Status: DC
  Administered 2016-06-12: 02:00:00 4 [IU] via SUBCUTANEOUS
  Filled 2016-06-11: qty 4

## 2016-06-11 NOTE — ED Notes (Signed)
Patient asking for something to helps her sleep, MD notified and she will put something in.

## 2016-06-11 NOTE — ED Notes (Signed)
Pamela Collier (husband)  [phone]

## 2016-06-11 NOTE — ED Notes (Signed)
Patient called out she felt like her blood sugar was dropping. This RN checked her blood sugar.

## 2016-06-11 NOTE — ED Triage Notes (Signed)
Pt in via POV with complaints of cough, wheezing, shortness of breath since Saturday.  Pt reports using nebulizer treatments at home with out any relief.  NAD noted at this time.

## 2016-06-11 NOTE — H&P (Signed)
History and Physical   [facility] - [place] @ [facility] Admission History and Physical [employer], D.O.    Patient [name] MR#: [id number] Date of Birth: [birth year] Date of Admission: 06/11/2016  Referring MD/NP/PA: Dr. Alfred Levins Primary Care Physician: [name], MD Outpatient Specialists: Thressa Sheller   Patient coming from: Home  Chief Complaint: SOB  HPI: Pamela Collier is a 76 y.o. female with a known history of sarcoidosis, CHF, CVA, COPD, CAD, diabetes, hypertension, hyperlipidemia, renal insufficiency was in a usual state of health until 4-5 days ago when she describes progressively worsening shortness of breath associated with a cough productive of yellow to green sputum. Her symptoms are worse with exertion and better with rest. Her symptoms are not relieved with her regular home albuterol nebulizer which she has been using more frequently. Patient is not home O2 dependent.  She also complains of stiffness in her right foot and ankle associated with some numbness and tingling which has been present for more than a week and began after several falls. She does follow with podiatry..  Of note she was seen in our emergency department on 05/19/2015 with a COPD exacerbation   Otherwise there has been no change in status. Patient has been taking medication as prescribed and there has been no recent change in medication or diet.  No recent antibiotics.  There has been no recent travel or sick contacts.    Patient denies fevers/chills, weakness, dizziness, chest pain,  N/V/C/D, abdominal pain, dysuria/frequency, changes in mental status.   ED Course: Patient received Solu-Medrol, Augmentin and doxycycline as well as nebulizer therapy in the emergency department  Review of Systems:  CONSTITUTIONAL: No fever/chills, fatigue, weakness, weight gain/loss, headache. EYES: No blurry or double vision. ENT: No tinnitus, postnasal drip, redness or  soreness of the oropharynx. RESPIRATORY: Positive cough, dyspnea, wheeze.  No hemoptysis.  CARDIOVASCULAR: No chest pain, palpitations, syncope, orthopnea. No lower extremity edema.  GASTROINTESTINAL: No nausea, vomiting, abdominal pain, diarrhea, constipation.  No hematemesis, melena or hematochezia. GENITOURINARY: No dysuria, frequency, hematuria. ENDOCRINE: No polyuria or nocturia. No heat or cold intolerance. HEMATOLOGY: No anemia, bruising, bleeding. INTEGUMENTARY: No rashes, ulcers, lesions. MUSCULOSKELETAL: No arthritis, gout, dyspnea. Positive right foot and ankle pain NEUROLOGIC: No numbness, tingling, ataxia, seizure-type activity, weakness. PSYCHIATRIC: No anxiety, depression, insomnia.   Past Medical History:  Diagnosis Date  . Acute respiratory failure ([place]) 11/21/2014  . Arthritis 08/05/2013  . Atherosclerotic peripheral vascular disease with gangrene [facility]) august 2012  . Cardiomyopathy, ischemic    a. EF 35 to 40% by echo in 2013 b. EF improved to 50-55% by echo in 04/2015.  [name] Cerebral infarct ([place]) 08/17/2013  . Chronic diastolic CHF (congestive heart failure) (HCC)    a. EF 50-55% by echo in 04/2015  . COPD (chronic obstructive pulmonary disease) ([place])   . Coronary artery disease, occlusive    a. Previous PCI to the LAD, LCx, and RCA in 2010, 2011, 2013, and 2016. All performed in [place].  . 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 Diabetic diarrhea ([place]) 10/03/2014  . DM type 2, uncontrolled, with renal complications ([place]) 01/16/2951  . Hepatic steatosis    by CT abd pelvis  . Hyperlipidemia LDL goal <100 02/23/2014  . Hypertensive heart disease   . Osteoporosis, post-menopausal   . 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 Pleural effusion 10/25/2012   10/2012 CT chest >>  small to moderate R lung effusion>> chylothorax, 100% lymphs 10/2013 thoracentesis> cytology negative, WBC 1471, > 90% "small  lymphs" 01/2014 CT chest> near complete resolution of pleural effusion, stable lymphadenopathy 08/2014 CT chest [place] ([facility]): small right sided effusion decreased in size, stable mediastinal lymphadenopathy 1.0cm largest, 34m  . 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 Renal insufficiency   . Sarcoidosis [facility])     Past Surgical History:  Procedure Laterality Date  . ABDOMINAL HYSTERECTOMY     at ge 39. secondary to bleeding/partial  . BREAST CYST ASPIRATION Right   . CARPAL TUNNEL RELEASE Bilateral   . CHOLECYSTECTOMY     in [place]   . COLONOSCOPY WITH PROPOFOL N/A 01/09/2015   Procedure: COLONOSCOPY WITH PROPOFOL;  Surgeon: Lucilla Lame, MD;  Location: [facility] ENDOSCOPY;  Service: Endoscopy;  Laterality: N/A;  . CORONARY ANGIOPLASTY WITH STENT PLACEMENT     [place]; [facility]  . HERNIA REPAIR     umbilical/ Dr Pat Patrick  . PTCA  August 2012   Right Posterior tibial artery , [place]  . TOE AMPUTATION  Sept 2012   Right 2nd toe, Vickki Muff     reports that she has never smoked. She has never used smokeless tobacco. She reports that she does not drink alcohol or use drugs.  Allergies  Allergen Reactions  . Cephalexin Itching  . Contrast Media [Iodinated Diagnostic Agents] Itching  . Doxycycline Itching  . Gabapentin Itching  . Sulfa Antibiotics Itching    Family History  Problem Relation Age of Onset  . Heart disease Father   . Heart attack Father   . Heart disease Sister   . Heart attack Sister   . Heart disease Brother   . Heart attack Brother   . Asthma Grandchild   . Breast cancer Maternal Aunt    Family history has been reviewed and confirmed with patient.   Prior to Admission medications    Medication Sig Start Date End Date Taking? Authorizing Provider  acetaminophen (TYLENOL) 500 MG tablet Take 500 mg by mouth every 6 (six) hours as needed for mild pain or headache.   Yes Historical Provider, MD  albuterol (PROAIR HFA) 108 (90 Base) MCG/ACT inhaler Inhale 2 puffs into the lungs every 6 (six) hours as needed for wheezing or shortness of breath. 02/26/16  Yes [name], MD  albuterol (PROVENTIL) (2.5 MG/3ML) 0.083% nebulizer solution Take 3 mLs (2.5 mg total) by nebulization every 6 (six) hours as needed for wheezing or shortness of breath. 05/06/16  Yes [name], MD  alendronate (FOSAMAX) 70 MG tablet Take 70 mg by mouth once a week. Pt takes on Sunday.   Take with a full glass of water on an empty stomach.   Yes Historical Provider, MD  B-D UF III MINI PEN NEEDLES 31G X 5 MM MISC USE THREE TIMES A DAY 02/23/16  Yes [name], MD  benzonatate (TESSALON) 100 MG capsule Take 200 mg by mouth 3 (three) times daily as needed for cough. Reported on 11/09/2015   Yes Historical Provider, MD  BRILINTA 90 MG TABS tablet Take 1 tablet (90 mg total) by mouth 2 (two) times daily. 10/16/15  Yes Helene Kelp  Ether Griffins, MD  docusate sodium (COLACE) 100 MG capsule Take 100 mg by mouth 2 (two) times daily as needed for mild constipation. Reported on 11/09/2015   Yes Historical Provider, MD  fluticasone (FLONASE) 50 MCG/ACT nasal spray Place 2 sprays into both nostrils daily as needed for rhinitis.   Yes Historical Provider, MD  furosemide (LASIX) 20 MG tablet Take 1 tablet (20 mg total) by mouth daily. 06/11/16  Yes [name], MD  Insulin Lispro Prot & Lispro (HUMALOG MIX 75/25 KWIKPEN) (75-25) 100 UNIT/ML Kwikpen Inject 45 to 60 units twice daily before morning and evening meals 11/27/15  Yes [name], MD  isosorbide mononitrate (IMDUR) 30 MG 24 hr tablet TAKE 1 TABLET DAILY 01/17/16  Yes [name], MD  losartan (COZAAR) 100 MG tablet TAKE 1 TABLET DAILY 12/19/15  Yes [name], MD  metoprolol tartrate (LOPRESSOR) 25 MG tablet Take 0.5 tablets (12.5 mg total) by mouth 2 (two) times daily. Patient taking differently: Take 25 mg by mouth 2 (two) times daily.  06/11/16  Yes [name], MD  nitroGLYCERIN (NITROSTAT) 0.4 MG SL tablet Place 1 tablet (0.4 mg total) under the tongue every 5 (five) minutes as needed for chest pain. 04/18/15  Yes Minna Merritts, MD  ondansetron (ZOFRAN) 4 MG tablet Take 1 tablet (4 mg total) by mouth every 8 (eight) hours as needed for nausea or vomiting. 05/18/16  Yes Rudene Re, MD  PARoxetine (PAXIL) 20 MG tablet TAKE ONE TABLET BY MOUTH ONCE DAILY 01/03/16  Yes [name], MD  polyethylene glycol (MIRALAX / GLYCOLAX) packet Take 17 g by mouth daily as needed for mild constipation. Reported on 10/25/2015   Yes Historical Provider, MD  pregabalin (LYRICA) 100 MG capsule Take 1 capsule (100 mg total) by mouth 3 (three) times daily. 04/29/16  Yes [name], MD  rosuvastatin (CRESTOR) 20 MG tablet Take 20 mg by mouth at bedtime.   Yes Historical Provider, MD  traMADol (ULTRAM) 50 MG tablet Take 50 mg by mouth every 6 (six) hours as needed for moderate pain.   Yes Historical Provider, MD  aspirin EC 81 MG tablet Take 81 mg by mouth daily.    Historical Provider, MD  budesonide-formoterol (SYMBICORT) 160-4.5 MCG/ACT inhaler Inhale 2 puffs into the lungs 2 (two) times daily. 02/23/15   Juanito Doom, MD  glucose blood (ACCU-CHEK AVIVA PLUS) test strip USE TO CHECK GLUCOSE  THREE TIMES DAILY 04/29/16   [name], MD  hydrOXYzine (ATARAX/VISTARIL) 10 MG tablet Take 1 tablet (10 mg total) by mouth 3 (three) times daily as needed. 01/08/16   Ponciano Ort, MD  Vitamin D, Ergocalciferol, (DRISDOL) 50000 units CAPS capsule Take 50,000 Units by mouth every 7 (seven) days. Pt takes on Sunday.    Historical Provider, MD    Physical Exam: Vitals:   06/11/16 1630 06/11/16 1730 06/11/16 1830 06/11/16 2100  BP: (!) 172/64 (!)  157/70 (!) 159/51 (!) 172/61  Pulse: 77 77 89 91  Resp:      Temp:      TempSrc:      SpO2: 95% 96% 96% 97%  Weight:      Height:        GENERAL: 76 y.o.-year-old Black female patient, well-developed, well-nourished lying in the bed in no acute distress.  Pleasant and cooperative.   HEENT: Head atraumatic, normocephalic. Pupils equal, round, reactive to light and accommodation. No scleral icterus. Extraocular muscles intact. Nares are  patent. Oropharynx is clear. Mucus membranes moist. NECK: Supple, full range of motion. No JVD, no bruit heard. No thyroid enlargement, no tenderness, no cervical lymphadenopathy. CHEST: Decreased air movement with diminished breath sounds, scant wheezing diffusely.. No use of accessory muscles of respiration.  No reproducible chest wall tenderness.  CARDIOVASCULAR: S1, S2 normal. No murmurs, rubs, or gallops. Cap refill <2 seconds. Pulses intact distally.  ABDOMEN: Soft, nondistended, nontender. No rebound, guarding, rigidity. Normoactive bowel sounds present in all four quadrants. No organomegaly or mass. EXTREMITIES: No pedal edema, cyanosis, or clubbing. No calf tenderness or Homan's sign. Patient is missing her right second toe. There is no erythema, edema or palpable tenderness in the bilateral lower extremities. NEUROLOGIC: The patient is alert and oriented x 3. Cranial nerves II through XII are grossly intact with no focal sensorimotor deficit. Muscle strength 5/5 in all extremities. Sensation intact. Gait not checked. PSYCHIATRIC:  Normal affect, mood, thought content. SKIN: Warm, dry, and intact without obvious rash, lesion, or ulcer.    Labs on Admission:  CBC:  Recent Labs Lab 06/11/16 1236  WBC 6.0  HGB 10.9*  HCT 32.4*  MCV 85.7  PLT 660   Basic Metabolic Panel:  Recent Labs Lab 06/11/16 1236  NA 139  K 3.7  CL 107  CO2 27  GLUCOSE 107*  BUN 12  CREATININE 1.11*  CALCIUM 8.6*   GFR: Estimated Creatinine Clearance: 48.1  mL/min (by C-G formula based on SCr of 1.11 mg/dL (H)). Liver Function Tests: No results for input(s): AST, ALT, ALKPHOS, BILITOT, PROT, ALBUMIN in the last 168 hours. No results for input(s): LIPASE, AMYLASE in the last 168 hours. No results for input(s): AMMONIA in the last 168 hours. Coagulation Profile: No results for input(s): INR, PROTIME in the last 168 hours. Cardiac Enzymes:  Recent Labs Lab 06/11/16 1236 06/11/16 1747  TROPONINI 0.03* <0.03   BNP (last 3 results)  Recent Labs  05/16/16 1336  PROBNP 300.0*   HbA1C: No results for input(s): HGBA1C in the last 72 hours. CBG:  Recent Labs Lab 06/11/16 1433 06/11/16 2043  GLUCAP 88 280*   Lipid Profile: No results for input(s): CHOL, HDL, LDLCALC, TRIG, CHOLHDL, LDLDIRECT in the last 72 hours. Thyroid Function Tests: No results for input(s): TSH, T4TOTAL, FREET4, T3FREE, THYROIDAB in the last 72 hours. Anemia Panel: No results for input(s): VITAMINB12, FOLATE, FERRITIN, TIBC, IRON, RETICCTPCT in the last 72 hours. Urine analysis:    Component Value Date/Time   COLORURINE YELLOW (A) 05/18/2016 0449   APPEARANCEUR HAZY (A) 05/18/2016 0449   APPEARANCEUR Clear 03/28/2014 0153   LABSPEC 1.015 05/18/2016 0449   LABSPEC 1.011 03/28/2014 0153   PHURINE 5.0 05/18/2016 0449   GLUCOSEU 50 (A) 05/18/2016 0449   GLUCOSEU NEGATIVE 10/25/2015 1004   HGBUR SMALL (A) 05/18/2016 0449   BILIRUBINUR NEGATIVE 05/18/2016 0449   BILIRUBINUR Negative 03/28/2014 0153   KETONESUR NEGATIVE 05/18/2016 0449   PROTEINUR >=300 (A) 05/18/2016 0449   UROBILINOGEN 0.2 10/25/2015 1004   NITRITE NEGATIVE 05/18/2016 0449   LEUKOCYTESUR NEGATIVE 05/18/2016 0449   LEUKOCYTESUR Negative 03/28/2014 0153   Sepsis Labs: @LABRCNTIP (procalcitonin:4,lacticidven:4) )No results found for this or any previous visit (from the past 240 hour(s)).   Radiological Exams on Admission: Dg Chest 2 View  Result Date: 06/11/2016 CLINICAL DATA:  Cough,  wheezing, and shortness of breath for the past 5 days. Symptoms are unrelieved with nebulizer treatment. History of CHF, coronary artery disease, COPD, sarcoidosis, and diabetes. EXAM: CHEST  2 VIEW COMPARISON:  Portable chest x-ray of May 18, 2016 FINDINGS: The lungs are borderline hypoinflated. The interstitial markings are diffusely increased. There is patchy density at the right lung base and thickening of the minor fissure. There is a small amount of pleural fluid blunting the costophrenic angles on the right. The heart is top-normal in size. The central pulmonary vascularity is prominent. There is calcification in the wall of the aortic arch. The mediastinum is normal in width. The bony thorax exhibits no acute abnormality. IMPRESSION: Bilateral hypoinflation. Chronic interstitial prominence bilaterally with probable superimposed atelectasis or pneumonia at the right lung base. Small right pleural effusion. Electronically Signed   By: David  Martinique M.D.   On: 06/11/2016 13:03    EKG: Sinus at 72 bpm with leftward axis, right bundle branch block with first-degree AV block and nonspecific ST-T wave changes.   Assessment/Plan Active Problems:   COPD exacerbation (HCC)    This is a 76 y.o. female with a history of sarcoidosis, CHF, CVA, COPD, CAD, diabetes, hypertension, hyperlipidemia, renal insufficiency  now being admitted with:  1. COPD exacerbation - IV steroids  - Nebulizers, O2 therapy and expectorants as needed.  - Continue Flonase, Symbicort - Continuous pulse oximetry - Consider pulmonary consult if not improving.   2. CAP - We will expand spectrum to IV Levaquin - IV fluid hydration - Duonebs, expectorants & O2 therapy as needed - Follow up blood & sputum cultures  3. H/o CHF - Continue Lopressor, Imdur, Lasix  4. H/o CVA - Continue ASA  5. H/o CAD - Continue Brilinta, nitro, ASA  6. H/o diabetes -RISS with meals and at bedtime  7. H/o hypertension - Continue  Cozaar, Lopressor  8. H/o hyperlipidemia - Continue Crestor  9. H/o sarcoidosis  10. H/o renal insufficiency, stable at baseline - Monitor BMP  11. H/o depression - Continue Paxil  12. H/o PAD/PVD/neuropathy - Continue Lyrica  13. Anemia - Monitor CBC in a.m.  Admission status: Inpatient IV Fluids: Gentle IVNS Diet/Nutrition: HH, CC Consults called: None  DVT Px: Lovenox, SCDs and early ambulation. Code Status: Full Code  Disposition Plan: To home in 1-2 days   All the records are reviewed and case discussed with ED provider. Management plans discussed with the patient and/or family who express understanding and agree with plan of care.  Giancarlos Berendt D.O. on 06/11/2016 at 9:05 PM Between 7am to 6pm - Pager - [phone] After 6pm go to [email or web address] - [employer] [facility] [place] [facility] Office [phone] CC: Primary care physician; [name], MD   06/11/2016, 9:05 PM

## 2016-06-11 NOTE — ED Notes (Signed)
Patient ambulated in room with assistance. Patient's SPO2 dropped to 92% on room air. Patient was winded and I sat her down. Patient reminded to focus on her breathing. SPO2 now 96% RA

## 2016-06-11 NOTE — ED Provider Notes (Signed)
[facility] Emergency Department Provider Note  ____________________________________________  Time seen: Approximately 1:33 PM  I have reviewed the triage vital signs and the nursing notes.   HISTORY  Chief Complaint Cough and Wheezing   HPI Pamela Collier is a 76 y.o. female history of sarcoidosis, CHF, CVA, COPD, CAD, diabetes, hypertension, hyperlipidemia, sarcoidosis, renal insufficiency who presents for evaluation of cough and shortness of breath. Patient reports 4 days of cough productive of yellow sputum, wheezing, and -progressively worsening shortness of breath. She has been using her albuterol nebulizer 3 times a day. No fever but has had night sweats. She denies chest pain, nausea, vomiting, diarrhea. She has not received her flu shot this year.  Past Medical History:  Diagnosis Date  . Acute respiratory failure ([place]) 11/21/2014  . Arthritis 08/05/2013  . Atherosclerotic peripheral vascular disease with gangrene [facility]) august 2012  . Cardiomyopathy, ischemic    a. EF 35 to 40% by echo in 2013 b. EF improved to 50-55% by echo in 04/2015.  [name] Cerebral infarct ([place]) 08/17/2013  . Chronic diastolic CHF (congestive heart failure) (HCC)    a. EF 50-55% by echo in 04/2015  . COPD (chronic obstructive pulmonary disease) ([place])   . Coronary artery disease, occlusive    a. Previous PCI to the LAD, LCx, and RCA in 2010, 2011, 2013, and 2016. All performed in [place].  . 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 Diabetic diarrhea ([place]) 10/03/2014  . DM type 2, uncontrolled, with renal complications ([place]) 05/17/1094  . Hepatic steatosis    by CT abd pelvis  . Hyperlipidemia LDL goal <100 02/23/2014  . Hypertensive heart disease   . Osteoporosis, post-menopausal   . 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 Pleural effusion 10/25/2012   10/2012 CT chest >> small to moderate R lung  effusion>> chylothorax, 100% lymphs 10/2013 thoracentesis> cytology negative, WBC 1471, > 90% "small lymphs" 01/2014 CT chest> near complete resolution of pleural effusion, stable lymphadenopathy 08/2014 CT chest [place] ([facility]): small right sided effusion decreased in size, stable mediastinal lymphadenopathy 1.0cm largest, 56m  . 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 Renal insufficiency   . Sarcoidosis [facility])     Patient Active Problem List   Diagnosis Date Noted  . Acute bronchitis 05/17/2016  . Epigastric hernia 04/01/2016  . Umbilical pain 04/54/0981  . Chest pain 01/02/2016  . Coronary artery disease involving native coronary artery of native heart with unstable angina pectoris ([place])   . Uncontrolled type 2 DM with peripheral circulatory disorder ([place])   . Chronic renal insufficiency   . Otitis media 12/23/2015  . Dehydration 10/28/2015  . Type 2 diabetes mellitus, uncontrolled, with neuropathy ([place]) 09/27/2015  . Shortness of breath 09/12/2015  . Hypertensive heart disease   . OSA on CPAP 07/07/2015  . Hospital discharge follow-up 07/07/2015  . Chest pain with low risk of acute coronary syndrome 06/29/2015  . Community acquired pneumonia   . Pleural effusion, right   . Fall 04/19/2015  . Acute on chronic kidney failure ([place]) 04/19/2015  . Chronic diastolic CHF (congestive heart failure) ([place]) 04/18/2015  .  Diabetic neuropathy ([place]) 02/21/2015  . Benign neoplasm of sigmoid colon   . Diabetic diarrhea ([place]) 10/03/2014  . Low back pain with radiation 04/04/2014  . Essential hypertension 04/04/2014  . Hyperlipidemia 02/23/2014  . Long-term use of high-risk medication 02/23/2014  . Dermatophytic onychia 08/17/2013  . Angina  pectoris ([place]) 08/17/2013  . Arteriosclerosis of coronary artery 08/17/2013  . Cerebral infarct ([place]) 08/17/2013  . Bony exostosis 08/17/2013  . Arthritis 08/05/2013  . History of colonic polyps 02/20/2013  . Sarcoidosis ([place]) 12/07/2012  . Dysphagia 12/07/2012  . Nocturnal cough 10/25/2012  . Pulmonary nodule seen on imaging study 05/20/2012  . 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 Cardiomyopathy, ischemic   . Hepatic steatosis   . Osteoporosis, post-menopausal   . Peripheral vascular disease due to secondary diabetes mellitus ([place])   . DM (diabetes mellitus), type 2, uncontrolled, with renal complications ([place]) 40/98/1191  . Double vessel coronary artery disease 12/14/2011    Past Surgical History:  Procedure Laterality Date  . ABDOMINAL HYSTERECTOMY     at ge 39. secondary to bleeding/partial  . BREAST CYST ASPIRATION Right   . CARPAL TUNNEL RELEASE Bilateral   . CHOLECYSTECTOMY     in [place]   . COLONOSCOPY WITH PROPOFOL N/A 01/09/2015   Procedure: COLONOSCOPY WITH PROPOFOL;  Surgeon: Lucilla Lame, MD;  Location: [facility] ENDOSCOPY;  Service: Endoscopy;  Laterality: N/A;  . CORONARY ANGIOPLASTY WITH STENT PLACEMENT     [place]; [facility]  . HERNIA REPAIR     umbilical/ Dr Pat Patrick  . PTCA  August 2012   Right Posterior tibial artery , [place]  . TOE AMPUTATION  Sept 2012   Right 2nd toe, [place]    Prior to Admission medications   Medication Sig Start Date End Date Taking? Authorizing Provider  acetaminophen (TYLENOL) 500 MG tablet Take 500 mg by mouth every 6 (six) hours as needed for mild pain or headache.   Yes Historical Provider, MD  albuterol (PROAIR HFA) 108 (90 Base) MCG/ACT inhaler Inhale 2 puffs into the lungs every 6 (six) hours as needed for wheezing or shortness of breath. 02/26/16  Yes [name], MD  albuterol (PROVENTIL) (2.5 MG/3ML) 0.083% nebulizer solution Take 3 mLs  (2.5 mg total) by nebulization every 6 (six) hours as needed for wheezing or shortness of breath. 05/06/16  Yes [name], MD  alendronate (FOSAMAX) 70 MG tablet Take 70 mg by mouth once a week. Pt takes on Sunday.   Take with a full glass of water on an empty stomach.   Yes Historical Provider, MD  B-D UF III MINI PEN NEEDLES 31G X 5 MM MISC USE THREE TIMES A DAY 02/23/16  Yes [name], MD  benzonatate (TESSALON) 100 MG capsule Take 200 mg by mouth 3 (three) times daily as needed for cough. Reported on 11/09/2015   Yes Historical Provider, MD  BRILINTA 90 MG TABS tablet Take 1 tablet (90 mg total) by mouth 2 (two) times daily. 10/16/15  Yes [name], MD  docusate sodium (COLACE) 100 MG capsule Take 100 mg by mouth 2 (two) times daily as needed for mild constipation. Reported on 11/09/2015   Yes Historical Provider, MD  fluticasone (FLONASE) 50 MCG/ACT nasal spray Place 2 sprays into both nostrils daily as needed for rhinitis.   Yes Historical Provider, MD  furosemide (LASIX) 20 MG tablet Take 1 tablet (20 mg total) by mouth  daily. 06/11/16  Yes [name], MD  Insulin Lispro Prot & Lispro (HUMALOG MIX 75/25 KWIKPEN) (75-25) 100 UNIT/ML Kwikpen Inject 45 to 60 units twice daily before morning and evening meals 11/27/15  Yes [name], MD  isosorbide mononitrate (IMDUR) 30 MG 24 hr tablet TAKE 1 TABLET DAILY 01/17/16  Yes [name], MD  losartan (COZAAR) 100 MG tablet TAKE 1 TABLET DAILY 12/19/15  Yes [name], MD  metoprolol tartrate (LOPRESSOR) 25 MG tablet Take 0.5 tablets (12.5 mg total) by mouth 2 (two) times daily. Patient taking differently: Take 25 mg by mouth 2 (two) times daily.  06/11/16  Yes [name], MD  nitroGLYCERIN (NITROSTAT) 0.4 MG SL tablet Place 1 tablet (0.4 mg total) under the tongue every 5 (five) minutes as needed for chest pain. 04/18/15  Yes Minna Merritts, MD  ondansetron (ZOFRAN) 4 MG tablet Take 1 tablet (4 mg total) by mouth every 8 (eight)  hours as needed for nausea or vomiting. 05/18/16  Yes Rudene Re, MD  PARoxetine (PAXIL) 20 MG tablet TAKE ONE TABLET BY MOUTH ONCE DAILY 01/03/16  Yes [name], MD  polyethylene glycol (MIRALAX / GLYCOLAX) packet Take 17 g by mouth daily as needed for mild constipation. Reported on 10/25/2015   Yes Historical Provider, MD  pregabalin (LYRICA) 100 MG capsule Take 1 capsule (100 mg total) by mouth 3 (three) times daily. 04/29/16  Yes [name], MD  rosuvastatin (CRESTOR) 20 MG tablet Take 20 mg by mouth at bedtime.   Yes Historical Provider, MD  traMADol (ULTRAM) 50 MG tablet Take 50 mg by mouth every 6 (six) hours as needed for moderate pain.   Yes Historical Provider, MD  aspirin EC 81 MG tablet Take 81 mg by mouth daily.    Historical Provider, MD  budesonide-formoterol (SYMBICORT) 160-4.5 MCG/ACT inhaler Inhale 2 puffs into the lungs 2 (two) times daily. 02/23/15   Juanito Doom, MD  glucose blood (ACCU-CHEK AVIVA PLUS) test strip USE TO CHECK GLUCOSE  THREE TIMES DAILY 04/29/16   [name], MD  hydrOXYzine (ATARAX/VISTARIL) 10 MG tablet Take 1 tablet (10 mg total) by mouth 3 (three) times daily as needed. 01/08/16   Ponciano Ort, MD  Vitamin D, Ergocalciferol, (DRISDOL) 50000 units CAPS capsule Take 50,000 Units by mouth every 7 (seven) days. Pt takes on Sunday.    Historical Provider, MD    Allergies Cephalexin; Contrast media [iodinated diagnostic agents]; Doxycycline; Gabapentin; and Sulfa antibiotics  Family History  Problem Relation Age of Onset  . Heart disease Father   . Heart attack Father   . Heart disease Sister   . Heart attack Sister   . Heart disease Brother   . Heart attack Brother   . Asthma Grandchild   . Breast cancer Maternal Aunt     Social History Social History  Substance Use Topics  . Smoking status: Never Smoker  . Smokeless tobacco: Never Used  . Alcohol use No    Review of Systems  Constitutional: Negative for fever. + Night  sweats Eyes: Negative for visual changes. ENT: Negative for sore throat. Neck: No neck pain  Cardiovascular: Negative for chest pain. Respiratory: + shortness of breath and cough, wheezing Gastrointestinal: Negative for abdominal pain, vomiting or diarrhea. Genitourinary: Negative for dysuria. Musculoskeletal: Negative for back pain. Skin: Negative for rash. Neurological: Negative for headaches, weakness or numbness. Psych: No SI or HI  ____________________________________________   PHYSICAL EXAM:  VITAL SIGNS:  ED Triage Vitals  Enc Vitals Group     BP 06/11/16 1219 (!) 162/58     Pulse Rate 06/11/16 1219 73     Resp 06/11/16 1219 18     Temp 06/11/16 1219 98.3 F (36.8 C)     Temp Source 06/11/16 1219 Oral     SpO2 06/11/16 1219 95 %     Weight 06/11/16 1220 186 lb (84.4 kg)     Height 06/11/16 1220 5\' 7"  (1.702 m)     Head Circumference --      Peak Flow --      Pain Score 06/11/16 1224 5     Pain Loc --      Pain Edu? --      Excl. in [place]? --     Constitutional: Alert and oriented. Well appearing and in no apparent distress. HEENT:      Head: Normocephalic and atraumatic.         Eyes: Conjunctivae are normal. Sclera is non-icteric. EOMI. PERRL      Mouth/Throat: Mucous membranes are moist.       Neck: Supple with no signs of meningismus. Cardiovascular: Regular rate and rhythm. No murmurs, gallops, or rubs. 2+ symmetrical distal pulses are present in all extremities. No JVD. Respiratory: Normal respiratory effort. Severely diminished air movement bilaterally with faint wheezes Gastrointestinal: Soft, non tender, and non distended with positive bowel sounds. No rebound or guarding. Musculoskeletal: Trace pitting edema bilateral lower extremity  Neurologic: Normal speech and language. Face is symmetric. Moving all extremities. No gross focal neurologic deficits are appreciated. Skin: Skin is warm, dry and intact. No rash noted. Psychiatric: Mood and affect are  normal. Speech and behavior are normal.  ____________________________________________   LABS (all labs ordered are listed, but only abnormal results are displayed)  Labs Reviewed  CBC - Abnormal; Notable for the following:       Result Value   RBC 3.78 (*)    Hemoglobin 10.9 (*)    HCT 32.4 (*)    RDW 16.1 (*)    All other components within normal limits  BASIC METABOLIC PANEL - Abnormal; Notable for the following:    Glucose, Bld 107 (*)    Creatinine, Ser 1.11 (*)    Calcium 8.6 (*)    GFR calc non Af Amer 47 (*)    GFR calc Af Amer 54 (*)    All other components within normal limits  TROPONIN I - Abnormal; Notable for the following:    Troponin I 0.03 (*)    All other components within normal limits  BRAIN NATRIURETIC PEPTIDE - Abnormal; Notable for the following:    B Natriuretic Peptide 362.0 (*)    All other components within normal limits  INFLUENZA PANEL BY PCR (TYPE A & B)  GLUCOSE, CAPILLARY  TROPONIN I   ____________________________________________  EKG  ED ECG REPORT I, Rudene Re, the attending physician, personally viewed and interpreted this ECG.  Sinus rhythm with first-degree AV block, rate of 72, right bundle branch block, normal QTC, left axis deviation, no ST elevations or depressions. Unchanged from prior. ____________________________________________  RADIOLOGY  CXR: Bilateral hypoinflation. Chronic interstitial prominence bilaterally with probable superimposed atelectasis or pneumonia at the right lung base. Small right pleural effusion. ____________________________________________   PROCEDURES  Procedure(s) performed: None Procedures Critical Care performed: yes  CRITICAL CARE Performed by: Rudene Re  ?  Total critical care time: 40 min  Critical care time was exclusive of separately billable procedures and  treating other patients.  Critical care was necessary to treat or prevent imminent or life-threatening  deterioration.  Critical care was time spent personally by me on the following activities: development of treatment plan with patient and/or surrogate as well as nursing, discussions with consultants, evaluation of patient's response to treatment, examination of patient, obtaining history from patient or surrogate, ordering and performing treatments and interventions, ordering and review of laboratory studies, ordering and review of radiographic studies, pulse oximetry and re-evaluation of patient's condition.  ____________________________________________   INITIAL IMPRESSION / ASSESSMENT AND PLAN / ED COURSE   76 y.o. female history of sarcoidosis, CHF, CVA, COPD, CAD, diabetes, hypertension, hyperlipidemia, sarcoidosis, renal insufficiency who presents for evaluation of cough and shortness of breath and wheezing. Patient normal work of breathing, normal vital signs, severely diminished air movement bilaterally with wheezing. We'll give 3 do one AB treatments, Solu-Medrol, and doxycycline. Chest x-ray concerning for possible infiltrate. We'll check for flu. Patient has trace pitting edema and mildly elevated BNP, we'll give a dose of IV Lasix 20 mg.  Clinical Course as of Jun 11 2002  Wed Jun 11, 2016  1914 Hemoglobin dropped from 12 to 10 in 3 weeks. Patient denies bleeding. Rectal showing brown stool guaiac negative.  [CV]  1930 CXR with pna. Patient continues to wheeze and have significant SOB with ambulation after 3 duoneb and 5 mg of albuterol/ Will admit to Hospitalist.  [CV]    Clinical Course User Index [CV] Rudene Re, MD    Pertinent labs & imaging results that were available during my care of the patient were reviewed by me and considered in my medical decision making (see chart for details).    ____________________________________________   FINAL CLINICAL IMPRESSION(S) / ED DIAGNOSES  Final diagnoses:  Community acquired pneumonia, unspecified laterality  COPD  exacerbation ([place])      NEW MEDICATIONS STARTED DURING THIS VISIT:  New Prescriptions   No medications on file     Note:  This document was prepared using Dragon voice recognition software and may include unintentional dictation errors.    Rudene Re, MD 06/11/16 2005

## 2016-06-11 NOTE — ED Notes (Signed)
Patient inquiring about food, she will receive a [place] sandwich tray

## 2016-06-11 NOTE — ED Notes (Addendum)
[place] MD approved starting patient's sliding scale coverage now rather than 8am since it was originally scheduled for 8am and patient just ate a meal.

## 2016-06-11 NOTE — ED Notes (Signed)
Husband went off site to get a bite to eat

## 2016-06-11 NOTE — Telephone Encounter (Signed)
Patient complains of wheezing and coughing, and is taking her treatment and seem no relief. SOB and unable to maintain control of her breathing.Dizziness, is with her husband and is taking cough syrup. She claims nothing is working.Not speaking full sentences without coughing and SOB  Advise patient to go to ED. Patient complied with advise and will go to ED

## 2016-06-11 NOTE — Telephone Encounter (Signed)
LMOM for patient to call back.

## 2016-06-11 NOTE — ED Notes (Signed)
MD Veronese at bedside  

## 2016-06-12 ENCOUNTER — Telehealth: Payer: Self-pay | Admitting: Internal Medicine

## 2016-06-12 LAB — STREP PNEUMONIAE URINARY ANTIGEN: STREP PNEUMO URINARY ANTIGEN: NEGATIVE

## 2016-06-12 LAB — GLUCOSE, CAPILLARY
GLUCOSE-CAPILLARY: 266 mg/dL — AB (ref 65–99)
GLUCOSE-CAPILLARY: 288 mg/dL — AB (ref 65–99)
Glucose-Capillary: 337 mg/dL — ABNORMAL HIGH (ref 65–99)

## 2016-06-12 MED ORDER — PREDNISONE 50 MG PO TABS: 50.0000 mg | ORAL_TABLET | Freq: Every day | ORAL | Status: DC

## 2016-06-12 MED ORDER — METHYLPREDNISOLONE SODIUM SUCC 125 MG IJ SOLR: 60.0000 mg | INTRAMUSCULAR | Status: DC

## 2016-06-12 MED ORDER — LEVOFLOXACIN 750 MG PO TABS: 750.0000 mg | ORAL_TABLET | ORAL | 0 refills | Status: DC

## 2016-06-12 MED ORDER — PREDNISONE 10 MG PO TABS: ORAL_TABLET | ORAL | 0 refills | Status: DC

## 2016-06-12 NOTE — Progress Notes (Signed)
Inpatient Diabetes Program Recommendations  AACE/ADA: New Consensus Statement on Inpatient Glycemic Control (2015)  Target Ranges:  Prepandial:   less than 140 mg/dL      Peak postprandial:   less than 180 mg/dL (1-2 hours)      Critically ill patients:  140 - 180 mg/dL   Results for DANAYE, [facility] (MRN [id number]) as of 06/12/2016 10:51  Ref. Range 06/11/2016 14:33 06/11/2016 20:43 06/11/2016 22:49 06/12/2016 01:59 06/12/2016 07:42  Glucose-Capillary Latest Ref Range: 65 - 99 mg/dL 88 280 (H) 366 (H) 337 (H) 266 (H)   Review of Glycemic Control  Diabetes history: DM2 Outpatient Diabetes medications: Humalog 75/25 45-60 units BID Current orders for Inpatient glycemic control: Novolog 0-15 units TID with meals, Novolog 0-5 units QHS  Inpatient Diabetes Program Recommendations: Insulin - Basal: Please consider ordering Lantus 15 units daily. Insulin - Meal Coverage: If steroids are continued, please consider ordering Novolog 4 units TID with meals for meal coverage.  Thanks, Barnie Alderman, RN, MSN, CDE Diabetes Coordinator Inpatient Diabetes Program [phone] (Team Pager from 8am to 5pm)

## 2016-06-12 NOTE — Discharge Summary (Signed)
[facility] [name]    MR#:  [id number]  DATE OF BIRTH:  10/16/1940  DATE OF ADMISSION:  06/11/2016 ADMITTING PHYSICIAN: Harvie Bridge, DO  DATE OF DISCHARGE: 06/12/2016  PRIMARY CARE PHYSICIAN: [name], MD    ADMISSION DIAGNOSIS:  COPD exacerbation ([place]) [J44.1] Community acquired pneumonia, unspecified laterality [J18.9]  DISCHARGE DIAGNOSIS:  Acute on chronic COPD exacerbation Acute bronchitis  SECONDARY DIAGNOSIS:   Past Medical History:  Diagnosis Date  . Acute respiratory failure ([place]) 11/21/2014  . Arthritis 08/05/2013  . Atherosclerotic peripheral vascular disease with gangrene [facility]) august 2012  . Cardiomyopathy, ischemic    a. EF 35 to 40% by echo in 2013 b. EF improved to 50-55% by echo in 04/2015.  [name] Cerebral infarct ([place]) 08/17/2013  . Chronic diastolic CHF (congestive heart failure) (H[facility])    a. EF 50-55% by echo in 04/2015  . COPD (chronic obstructive pulmonary disease) ([place])   . Coronary artery disease, occlusive    a. Previous PCI to the LAD, LCx, and RCA in 2010, 2011, 2013, and 2016. All performed in [place].  . 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 Diabetic diarrhea ([place]) 10/03/2014  . DM type 2, uncontrolled, with renal complications ([place]) 06/20/9369  . Hepatic steatosis    by CT abd pelvis  . Hyperlipidemia LDL goal <100 02/23/2014  . Hypertensive heart disease   . Osteoporosis, post-menopausal   . 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 Pleural effusion 10/25/2012   10/2012 CT chest >> small to moderate R lung effusion>> chylothorax, 100% lymphs 10/2013 thoracentesis> cytology negative, WBC 1471, > 90% "small lymphs" 01/2014 CT chest> near complete resolution of pleural effusion, stable lymphadenopathy 08/2014 CT chest [place] ([facility]): small right sided effusion  decreased in size, stable mediastinal lymphadenopathy 1.0cm largest, 65m  . 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 Renal insufficiency   . Sarcoidosis [facility])     HOSPITAL COURSE:  76 y.o. female with a history of sarcoidosis, CHF, CVA, COPD, CAD, diabetes, hypertension, hyperlipidemia, renal insufficiency  now being admitted with:  1. COPD exacerbation, acute on chronic - IV steroids  - Nebulizers, O2 therapy and expectorants as needed.  - Continue Flonase, Symbicort - sats 98% on RA. Not in any respiratory distress -pt will f/u pulmonary Dr Isidore Moos as out pt  2. Acute bronchitis -po levaquin - received IV fluid hydration - Duonebs, expectorants & O2 therapy as needed  3. H/o CHF - Continue Lopressor, Imdur, Lasix  4. H/o CVA - Continue ASA  5. H/o CAD - Continue Brilinta, nitro, ASA  6. H/o diabetes -RISS with meals and at bedtime  7. H/o hypertension - Continue Cozaar, Lopressor  8. H/o hyperlipidemia - Continue Crestor  9. H/o sarcoidosis  10. H/o renal insufficiency, stable at baseline - Monitor BMP  11. H/o depression - Continue Paxil  12. H/o PAD/PVD/neuropathy - Continue Lyrica   Overall seems to be better and nearing baseline CONSULTS OBTAINED:    DRUG ALLERGIES:   Allergies  Allergen Reactions  . Cephalexin Itching  . Contrast Media [Iod[employer]] Itching  . Doxycycline Itching  . Gabapentin Itching  . Sulfa Antibiotics Itching    DISCHARGE MEDICATIONS:   Current Discharge Medication List    START taking these medications   Details  levofloxacin (LEVAQUIN) 750 MG tablet Take 1 tablet (750 mg total) by mouth daily. Qty: 4 tablet, Refills: 0    predniSONE (DELTASONE) 10 MG tablet  Take 50 mg daily---taper by 10 mg daily then stop Qty: 15 tablet, Refills: 0      CONTINUE these medications which have NOT CHANGED   Details  acetaminophen (TYLENOL) 500 MG tablet Take 500 mg by mouth every 6 (six) hours as needed for mild pain or headache.    albuterol (PROAIR HFA) 108 (90 Base) MCG/ACT inhaler Inhale 2 puffs into the lungs every 6 (six) hours as needed for wheezing or shortness of breath. Qty: 1 Inhaler, Refills: 2    albuterol (PROVENTIL) (2.5 MG/3ML) 0.083% nebulizer solution Take 3 mLs (2.5 mg total) by nebulization every 6 (six) hours as needed for wheezing or shortness of breath. Qty: 150 mL, Refills: 1    alendronate (FOSAMAX) 70 MG tablet Take 70 mg by mouth once a week. Pt takes on Sunday.   Take with a full glass of water on an empty stomach.    B-D UF III MINI PEN NEEDLES 31G X 5 MM MISC USE THREE TIMES A DAY Qty: 270 each, Refills: 3    benzonatate (TESSALON) 100 MG capsule Take 200 mg by mouth 3 (three) times daily as needed for cough. Reported on 11/09/2015    BRILINTA 90 MG TABS tablet Take 1 tablet (90 mg total) by mouth 2 (two) times daily. Qty: 180 tablet, Refills: 2   Associated Diagnoses: Cardiomyopathy, ischemic    docusate sodium (COLACE) 100 MG capsule Take 100 mg by mouth 2 (two) times daily as needed for mild constipation. Reported on 11/09/2015    fluticasone (FLONASE) 50 MCG/ACT nasal spray Place 2 sprays into both nostrils daily as needed for rhinitis.    furosemide (LASIX) 20 MG tablet Take 1 tablet (20 mg total) by mouth daily. Qty: 90 tablet, Refills: 1   Associated Diagnoses: Cardiomyopathy, ischemic    Insulin Lispro Prot & Lispro (HUMALOG MIX 75/25 KWIKPEN) (75-25) 100 UNIT/ML Kwikpen Inject 45 to 60 units twice daily before morning and evening meals Qty: 112 mL, Refills: 3    isosorbide mononitrate (IMDUR) 30 MG 24 hr tablet TAKE 1 TABLET DAILY Qty: 90 tablet, Refills: 1    losartan (COZAAR) 100 MG tablet TAKE 1 TABLET  DAILY Qty: 90 tablet, Refills: 1    metoprolol tartrate (LOPRESSOR) 25 MG tablet Take 0.5 tablets (12.5 mg total) by mouth 2 (two) times daily. Qty: 90 tablet, Refills: 1    nitroGLYCERIN (NITROSTAT) 0.4 MG SL tablet Place 1 tablet (0.4 mg total) under the tongue every 5 (five) minutes as needed for chest pain. Qty: 25 tablet, Refills: 3    ondansetron (ZOFRAN) 4 MG tablet Take 1 tablet (4 mg total) by mouth every 8 (eight) hours as needed for nausea or vomiting. Qty: 20 tablet, Refills: 0    PARoxetine (PAXIL) 20 MG tablet TAKE ONE TABLET BY MOUTH ONCE DAILY Qty: 30 tablet, Refills: 1    polyethylene glycol (MIRALAX / GLYCOLAX) packet Take 17 g by mouth daily as needed for mild constipation. Reported on 10/25/2015    pregabalin (LYRICA) 100 MG capsule Take 1 capsule (100 mg total) by mouth 3 (three) times daily. Qty: 270 capsule, Refills: 2  rosuvastatin (CRESTOR) 20 MG tablet Take 20 mg by mouth at bedtime.    traMADol (ULTRAM) 50 MG tablet Take 50 mg by mouth every 6 (six) hours as needed for moderate pain.    aspirin EC 81 MG tablet Take 81 mg by mouth daily.    budesonide-formoterol (SYMBICORT) 160-4.5 MCG/ACT inhaler Inhale 2 puffs into the lungs 2 (two) times daily. Qty: 1 Inhaler, Refills: 0    glucose blood (ACCU-CHEK AVIVA PLUS) test strip USE TO CHECK GLUCOSE  THREE TIMES DAILY Qty: 300 each, Refills: 3    hydrOXYzine (ATARAX/VISTARIL) 10 MG tablet Take 1 tablet (10 mg total) by mouth 3 (three) times daily as needed. Qty: 30 tablet, Refills: 0    Vitamin D, Ergocalciferol, (DRISDOL) 50000 units CAPS capsule Take 50,000 Units by mouth every 7 (seven) days. Pt takes on Sunday.        If you experience worsening of your admission symptoms, develop shortness of breath, life threatening emergency, suicidal or homicidal thoughts you must seek medical attention immediately by calling 911 or calling your MD immediately  if symptoms less severe.  You Must read complete  instructions/literature along with all the possible adverse reactions/side effects for all the Medicines you take and that have been prescribed to you. Take any new Medicines after you have completely understood and accept all the possible adverse reactions/side effects.   Please note  You were cared for by a hospitalist during your hospital stay. If you have any questions about your discharge medications or the care you received while you were in the hospital after you are discharged, you can call the unit and asked to speak with the hospitalist on call if the hospitalist that took care of you is not available. Once you are discharged, your primary care physician will handle any further medical issues. Please note that NO REFILLS for any discharge medications will be authorized once you are discharged, as it is imperative that you return to your primary care physician (or establish a relationship with a primary care physician if you do not have one) for your aftercare needs so that they can reassess your need for medications and monitor your lab values. Today   SUBJECTIVE   Feels better than yday. Coughing up white phlegm  VITAL SIGNS:  Blood pressure (!) 160/60, pulse 71, temperature 98.1 F (36.7 C), temperature source Oral, resp. rate 17, height 5\' 7"  (1.702 m), weight 84.4 kg (186 lb), SpO2 99 %.  I/O:   Intake/Output Summary (Last 24 hours) at 06/12/16 1258 Last data filed at 06/12/16 0630  Gross per 24 hour  Intake                0 ml  Output                1 ml  Net               -1 ml    PHYSICAL EXAMINATION:  GENERAL:  76 y.o.-year-old patient lying in the bed with no acute distress. obese EYES: Pupils equal, round, reactive to light and accommodation. No scleral icterus. Extraocular muscles intact.  HEENT: Head atraumatic, normocephalic. Oropharynx and nasopharynx clear.  NECK:  Supple, no jugular venous distention. No thyroid enlargement, no tenderness.  LUNGS: Normal breath  sounds bilaterally, no wheezing, rales,rhonchi or crepitation. No use of accessory muscles of respiration.  CARDIOVASCULAR: S1, S2 normal. No murmurs, rubs, or gallops.  ABDOMEN: Soft, non-tender, non-distended. Bowel sounds present. No organomegaly or mass.  EXTREMITIES: No pedal edema, cyanosis, or clubbing.  NEUROLOGIC: Cranial nerves II through XII are intact. Muscle strength 5/5 in all extremities. Sensation intact. Gait not checked. Tremor+ PSYCHIATRIC: The patient is alert and oriented x 3.  SKIN: No obvious rash, lesion, or ulcer.   DATA REVIEW:   CBC   Recent Labs Lab 06/11/16 1236  WBC 6.0  HGB 10.9*  HCT 32.4*  PLT 335    Chemistries   Recent Labs Lab 06/11/16 1236  NA 139  K 3.7  CL 107  CO2 27  GLUCOSE 107*  BUN 12  CREATININE 1.11*  CALCIUM 8.6*    Microbiology Results   Recent Results (from the past 240 hour(s))  Culture, blood (routine x 2) Call MD if unable to obtain prior to antibiotics being given     Status: None (Preliminary result)   Collection Time: 06/11/16  9:52 PM  Result Value Ref Range Status   Specimen Description BLOOD LEFT FOREARM  Final   Special Requests   Final    BOTTLES DRAWN AEROBIC AND ANAEROBIC  AER 14CC ANA 13CC   Culture NO GROWTH < 12 HOURS  Final   Report Status PENDING  Incomplete  Culture, blood (routine x 2) Call MD if unable to obtain prior to antibiotics being given     Status: None (Preliminary result)   Collection Time: 06/11/16  9:52 PM  Result Value Ref Range Status   Specimen Description BLOOD RIGHT FOREARM  Final   Special Requests BOTTLES DRAWN AEROBIC AND ANAEROBIC  11CC  Final   Culture NO GROWTH < 12 HOURS  Final   Report Status PENDING  Incomplete  Culture, sputum-assessment     Status: None (Preliminary result)   Collection Time: 06/11/16  9:52 PM  Result Value Ref Range Status   Specimen Description EXPECTORATED SPUTUM  Final   Special Requests Immunocompromised  Final   Sputum evaluation   Final     Sputum specimen not acceptable for testing.  Please recollect.   RAQUEL DAVID ON 06/12/16 AT 0125 BY TLB    Report Status PENDING  Incomplete    RADIOLOGY:  Dg Chest 2 View  Result Date: 06/11/2016 CLINICAL DATA:  Cough, wheezing, and shortness of breath for the past 5 days. Symptoms are unrelieved with nebulizer treatment. History of CHF, coronary artery disease, COPD, sarcoidosis, and diabetes. EXAM: CHEST  2 VIEW COMPARISON:  Portable chest x-ray of May 18, 2016 FINDINGS: The lungs are borderline hypoinflated. The interstitial markings are diffusely increased. There is patchy density at the right lung base and thickening of the minor fissure. There is a small amount of pleural fluid blunting the costophrenic angles on the right. The heart is top-normal in size. The central pulmonary vascularity is prominent. There is calcification in the wall of the aortic arch. The mediastinum is normal in width. The bony thorax exhibits no acute abnormality. IMPRESSION: Bilateral hypoinflation. Chronic interstitial prominence bilaterally with probable superimposed atelectasis or pneumonia at the right lung base. Small right pleural effusion. Electronically Signed   By: David  Martinique M.D.   On: 06/11/2016 13:03     Management plans discussed with the patient, family and they are in agreement.  CODE STATUS:     Code Status Orders        Start     Ordered   06/11/16 2143  Full code  Continuous     06/11/16 2142    Code Status History    Date Active Date Inactive Code Status  Order ID Comments User Context   01/02/2016  5:24 AM 01/02/2016  9:14 AM Full Code [id number]  Saundra Shelling, MD Inpatient   06/26/2015  7:36 AM 06/29/2015  7:54 PM Full Code [id number]  Demetrios Loll, MD ED   04/20/2015  1:18 AM 04/22/2015  7:18 PM Full Code [id number]  Lance Coon, MD Inpatient      TOTAL TIME TAKING CARE OF THIS PATIENT: 40 minutes.    Zarra Geffert M.D on 06/12/2016 at 12:58 PM  Between 7am to 6pm - Pager -  [phone] After 6pm go to [email or web address] - password [email or web address] [place] [facility]  Office  [phone]  CC: Primary care physician; [name], MD

## 2016-06-12 NOTE — Telephone Encounter (Signed)
Ginger from [facility] called and needs to schedule a 2 week follow up for pt. She was in for COPD exacerbation. Please advise, thank you.  Call [place] @ [phone]

## 2016-06-12 NOTE — ED Notes (Signed)
Transporting patient to room 104-1C with EDT Mayra

## 2016-06-12 NOTE — Telephone Encounter (Signed)
Feb, 8 a Thursday can I have the 11.30 for HFU for COPD exacerbation.

## 2016-06-12 NOTE — Telephone Encounter (Signed)
yes

## 2016-06-13 ENCOUNTER — Telehealth: Payer: Self-pay | Admitting: Internal Medicine

## 2016-06-13 ENCOUNTER — Other Ambulatory Visit: Payer: Self-pay | Admitting: Internal Medicine

## 2016-06-13 DIAGNOSIS — J189 Pneumonia, unspecified organism: Secondary | ICD-10-CM

## 2016-06-13 LAB — LEGIONELLA PNEUMOPHILA SEROGP 1 UR AG: L. pneumophila Serogp 1 Ur Ag: NEGATIVE

## 2016-06-13 NOTE — Telephone Encounter (Signed)
Family ask if a nurse could come to the home for medication management to teach family about patient medication.

## 2016-06-13 NOTE — Telephone Encounter (Signed)
KATHY, IF YOU CAN GET A HOME HEALTH AGENCY TO [facility] BEFORE I SEE HER.  I WILL APPROVE IT .

## 2016-06-13 NOTE — Telephone Encounter (Signed)
Please advise of where to schedule hosp f/u.

## 2016-06-13 NOTE — Telephone Encounter (Signed)
Transition Care Management Follow-up Telephone Call  How have you been since you were released from the hospital? "Just weak and tired, My breathing is better."   Do you understand why you were in the hospital? "Yes, they said I had pneumonia and a infection."   Do you understand the discharge instrcutions?  Yes,"Some what some of diabetic medications were changed. I will bring my discharge instructions with me." Items Reviewed:  Medications reviewed: Yes  Allergies reviewed: Yes  Dietary changes reviewed: Yes  Referrals reviewed: Yes   Functional Questionnaire:   Activities of Daily Living (ADLs):   She states they are independent in the following:   Bathing , Clothing, Helps to prepare meals. States they require assistance with the following: Needs assist with medication they are confusing to patient and Patient husband.   Any transportation issues/concerns?: No   Any patient concerns? Yes with medication patient gets confused.   Confirmed importance and date/time of follow-up visits scheduled: Yes   Confirmed with patient if condition begins to worsen call PCP or go to the ER.  Patient was given the Call-a-Nurse line [phone]:YES

## 2016-06-13 NOTE — Telephone Encounter (Signed)
Schedule her with Alva Garnet around the 16th or 19th and have her to get a CXR prior to the appt. CXR order placed. Thanks.

## 2016-06-16 LAB — CULTURE, BLOOD (ROUTINE X 2)
Culture: NO GROWTH
Culture: NO GROWTH

## 2016-06-18 ENCOUNTER — Ambulatory Visit (INDEPENDENT_AMBULATORY_CARE_PROVIDER_SITE_OTHER): Payer: Medicare Other | Admitting: Internal Medicine

## 2016-06-18 ENCOUNTER — Encounter: Payer: Self-pay | Admitting: Internal Medicine

## 2016-06-18 VITALS — BP 180/80 | HR 59 | Temp 98.4°F | Resp 16 | Wt 184.0 lb

## 2016-06-18 DIAGNOSIS — I10 Essential (primary) hypertension: Secondary | ICD-10-CM | POA: Diagnosis not present

## 2016-06-18 DIAGNOSIS — Z794 Long term (current) use of insulin: Secondary | ICD-10-CM

## 2016-06-18 DIAGNOSIS — J189 Pneumonia, unspecified organism: Secondary | ICD-10-CM

## 2016-06-18 DIAGNOSIS — IMO0002 Reserved for concepts with insufficient information to code with codable children: Secondary | ICD-10-CM

## 2016-06-18 DIAGNOSIS — E1121 Type 2 diabetes mellitus with diabetic nephropathy: Secondary | ICD-10-CM

## 2016-06-18 DIAGNOSIS — E1165 Type 2 diabetes mellitus with hyperglycemia: Secondary | ICD-10-CM | POA: Diagnosis not present

## 2016-06-18 DIAGNOSIS — E538 Deficiency of other specified B group vitamins: Secondary | ICD-10-CM

## 2016-06-18 DIAGNOSIS — E1151 Type 2 diabetes mellitus with diabetic peripheral angiopathy without gangrene: Secondary | ICD-10-CM | POA: Diagnosis not present

## 2016-06-18 DIAGNOSIS — J181 Lobar pneumonia, unspecified organism: Secondary | ICD-10-CM | POA: Diagnosis not present

## 2016-06-18 DIAGNOSIS — E1169 Type 2 diabetes mellitus with other specified complication: Secondary | ICD-10-CM | POA: Diagnosis not present

## 2016-06-18 DIAGNOSIS — E785 Hyperlipidemia, unspecified: Secondary | ICD-10-CM | POA: Diagnosis not present

## 2016-06-18 DIAGNOSIS — Z09 Encounter for follow-up examination after completed treatment for conditions other than malignant neoplasm: Secondary | ICD-10-CM

## 2016-06-18 LAB — LIPID PANEL
CHOLESTEROL: 145 mg/dL (ref 0–200)
HDL: 47 mg/dL (ref >39.00)
LDL CALC: 66 mg/dL (ref 0–99)
NonHDL: 98.4
TRIGLYCERIDES: 164 mg/dL — AB (ref 0.0–149.0)
Total CHOL/HDL Ratio: 3
VLDL: 32.8 mg/dL (ref 0.0–40.0)

## 2016-06-18 LAB — GLUCOSE, POCT (MANUAL RESULT ENTRY): POC Glucose: 57 mg/dl — AB (ref 70–99)

## 2016-06-18 LAB — POCT CBG (FASTING - GLUCOSE)-MANUAL ENTRY: GLUCOSE FASTING, POC: 90 mg/dL (ref 70–99)

## 2016-06-18 LAB — POCT GLYCOSYLATED HEMOGLOBIN (HGB A1C): HEMOGLOBIN A1C: 7.1

## 2016-06-18 MED ORDER — CYANOCOBALAMIN 1000 MCG/ML IJ SOLN
1000.0000 ug | Freq: Once | INTRAMUSCULAR | Status: AC
  Administered 2016-06-18: 1000 ug via INTRAMUSCULAR

## 2016-06-18 MED ORDER — METOPROLOL SUCCINATE ER 25 MG PO TB24: 25.0000 mg | ORAL_TABLET | Freq: Every day | ORAL | 3 refills | Status: DC

## 2016-06-18 NOTE — Progress Notes (Signed)
Subjective:  Patient ID: Pamela Collier, female    DOB: 07/23/1940  Age: 76 y.o. MRN: [id number]  CC: The primary encounter diagnosis was Uncontrolled type 2 diabetes mellitus with diabetic nephropathy, with long-term current use of insulin ([place]). Diagnoses of Hyperlipidemia associated with type 2 diabetes mellitus ([place]), B12 deficiency, Essential hypertension, Uncontrolled type 2 DM with peripheral circulatory disorder ([place]), Community acquired pneumonia of right lower lobe of lung ([place]), and Hospital discharge follow-up were also pertinent to this visit.  HPI Pamela Collier presents for hospital follow up.  She was admitted to [facility] on Jan 31  for COPD exacerbation secondary to community acquired  Pneumonia with a right lobar infiltrate seen on chest x ray. .  She did not require intubation,  She was treated with IV steroids, nebulized beta agonists steroids and anticholinergics,  And discharged from [facility] to home on feb 1.  She finished prednisone taper 3 days ago,  She feels weak,  But denies fevers, shortness of breath, nausea and diarrhea.  She is accompanied by her husband today who states that she is not eating on a regular basis and this is affecting her medication administration. She disagrees with him.     Home health was requested for assistance in managing her medicationsand ordered but no contact has been made with patient as of yet.   Feeling dizzy and weak this morning due to low blood sugar.   Ate plain instant oatmeal around 10 am  this morning no sugar added ,  Took  44 units of of 75/25 around 9 am  And her fasting was 157 this morning.  [name]  CBG was 57 in the waiting room.          She reports getting confused with her  Medications. She is  having recurrent low blood sugars at night,  she is taking her mixed insulin over an hour before she eats .    Has been taking 25 mg metoprolol bid, and her heart rate has been slow.    [facility] FE[facility] TH   Lab Results  Component  Value Date   HGBA1C 7.1 06/18/2016    Outpatient Medications Prior to Visit  Medication Sig Dispense Refill  . acetaminophen (TYLENOL) 500 MG tablet Take 500 mg by mouth every 6 (six) hours as needed for mild pain or headache.    . albuterol (PROAIR HFA) 108 (90 Base) MCG/ACT inhaler Inhale 2 puffs into the lungs every 6 (six) hours as needed for wheezing or shortness of breath. 1 Inhaler 2  . albuterol (PROVENTIL) (2.5 MG/3ML) 0.083% nebulizer solution Take 3 mLs (2.5 mg total) by nebulization every 6 (six) hours as needed for wheezing or shortness of breath. 150 mL 1  . alendronate (FOSAMAX) 70 MG tablet Take 70 mg by mouth once a week. Pt takes on Sunday.   Take with a full glass of water on an empty stomach.    [name] aspirin EC 81 MG tablet Take 81 mg by mouth daily.    . B-D UF III MINI PEN NEEDLES 31G X 5 MM MISC USE THREE TIMES A DAY 270 each 3  . benzonatate (TESSALON) 100 MG capsule Take 200 mg by mouth 3 (three) times daily as needed for cough. Reported on 11/09/2015    . BRILINTA 90 MG TABS tablet Take 1 tablet (90 mg total) by mouth 2 (two) times daily. 180 tablet 2  . budesonide-formoterol (SYMBICORT) 160-4.5 MCG/ACT inhaler Inhale 2 puffs into the  lungs 2 (two) times daily. 1 Inhaler 0  . docusate sodium (COLACE) 100 MG capsule Take 100 mg by mouth 2 (two) times daily as needed for mild constipation. Reported on 11/09/2015    . fluticasone (FLONASE) 50 MCG/ACT nasal spray Place 2 sprays into both nostrils daily as needed for rhinitis.    . furosemide (LASIX) 20 MG tablet Take 1 tablet (20 mg total) by mouth daily. 90 tablet 1  . glucose blood (ACCU-CHEK AVIVA PLUS) test strip USE TO CHECK GLUCOSE  THREE TIMES DAILY 300 each 3  . hydrOXYzine (ATARAX/VISTARIL) 10 MG tablet Take 1 tablet (10 mg total) by mouth 3 (three) times daily as needed. 30 tablet 0  . Insulin Lispro Prot & Lispro (HUMALOG MIX 75/25 KWIKPEN) (75-25) 100 UNIT/ML Kwikpen Inject 45 to 60 units twice daily before morning  and evening meals 112 mL 3  . isosorbide mononitrate (IMDUR) 30 MG 24 hr tablet TAKE 1 TABLET DAILY 90 tablet 1  . levofloxacin (LEVAQUIN) 750 MG tablet Take 1 tablet (750 mg total) by mouth daily. 4 tablet 0  . losartan (COZAAR) 100 MG tablet TAKE 1 TABLET DAILY 90 tablet 0  . metoprolol tartrate (LOPRESSOR) 25 MG tablet Take 0.5 tablets (12.5 mg total) by mouth 2 (two) times daily. (Patient taking differently: Take 25 mg by mouth 2 (two) times daily. ) 90 tablet 1  . nitroGLYCERIN (NITROSTAT) 0.4 MG SL tablet Place 1 tablet (0.4 mg total) under the tongue every 5 (five) minutes as needed for chest pain. 25 tablet 3  . ondansetron (ZOFRAN) 4 MG tablet Take 1 tablet (4 mg total) by mouth every 8 (eight) hours as needed for nausea or vomiting. 20 tablet 0  . PARoxetine (PAXIL) 20 MG tablet TAKE ONE TABLET BY MOUTH ONCE DAILY 30 tablet 1  . polyethylene glycol (MIRALAX / GLYCOLAX) packet Take 17 g by mouth daily as needed for mild constipation. Reported on 10/25/2015    . predniSONE (DELTASONE) 10 MG tablet Take 50 mg daily---taper by 10 mg daily then stop 15 tablet 0  . pregabalin (LYRICA) 100 MG capsule Take 1 capsule (100 mg total) by mouth 3 (three) times daily. 270 capsule 2  . rosuvastatin (CRESTOR) 20 MG tablet Take 20 mg by mouth at bedtime.    . traMADol (ULTRAM) 50 MG tablet Take 50 mg by mouth every 6 (six) hours as needed for moderate pain.    . Vitamin D, Ergocalciferol, (DRISDOL) 50000 units CAPS capsule Take 50,000 Units by mouth every 7 (seven) days. Pt takes on Sunday.     Facility-Administered Medications Prior to Visit  Medication Dose Route Frequency Provider Last Rate Last Dose  . albuterol (PROVENTIL) (5 MG/ML) 0.5% nebulizer solution 2.5 mg  2.5 mg Nebulization Once [name], MD        Review of Systems;  Patient denies headache, fevers, malaise, unintentional weight loss, skin rash, eye pain, sinus congestion and sinus pain, sore throat, dysphagia,  hemoptysis ,  cough, dyspnea, wheezing, chest pain, palpitations, orthopnea, edema, abdominal pain, nausea, melena, diarrhea, constipation, flank pain, dysuria, hematuria, urinary  Frequency, nocturia, numbness, tingling, seizures,  Focal weakness, Loss of consciousness,  Tremor, insomnia, depression, anxiety, and suicidal ideation.      Objective:  BP (!) 180/80   Pulse (!) 59   Temp 98.4 F (36.9 C) (Oral)   Resp 16   Wt 184 lb (83.5 kg)   SpO2 97%   BMI 28.82 kg/m   BP Readings from Last  3 Encounters:  06/18/16 (!) 180/80  06/12/16 (!) 160/60  05/26/16 (!) 138/58    Wt Readings from Last 3 Encounters:  06/18/16 184 lb (83.5 kg)  06/11/16 186 lb (84.4 kg)  05/26/16 186 lb 12.8 oz (84.7 kg)    General appearance: alert, cooperative and appears stated age Ears: normal TM's and external ear canals both ears Throat: lips, mucosa, and tongue normal; teeth and gums normal Neck: no adenopathy, no carotid bruit, supple, symmetrical, trachea midline and thyroid not enlarged, symmetric, no tenderness/mass/nodules Back: symmetric, no curvature. ROM normal. No CVA tenderness. Lungs: clear to auscultation bilaterally Heart: regular rate and rhythm, S1, S2 normal, no murmur, click, rub or gallop Abdomen: soft, non-tender; bowel sounds normal; no masses,  no organomegaly Pulses: 2+ and symmetric Skin: Skin color, texture, turgor normal. No rashes or lesions Lymph nodes: Cervical, supraclavicular, and axillary nodes normal.  Lab Results  Component Value Date   HGBA1C 7.1 06/18/2016   HGBA1C 7.6 (H) 03/14/2016   HGBA1C 7.7 12/13/2015    Lab Results  Component Value Date   CREATININE 1.11 (H) 06/11/2016   CREATININE 1.25 (H) 05/17/2016   CREATININE 1.21 (H) 03/14/2016    Lab Results  Component Value Date   WBC 6.0 06/11/2016   HGB 10.9 (L) 06/11/2016   HCT 32.4 (L) 06/11/2016   PLT 335 06/11/2016   GLUCOSE 107 (H) 06/11/2016   CHOL 145 06/18/2016   TRIG 164.0 (H) 06/18/2016   HDL  47.00 06/18/2016   LDLDIRECT 102.0 03/14/2016   LDLCALC 66 06/18/2016   ALT 12 03/14/2016   AST 16 03/14/2016   NA 139 06/11/2016   K 3.7 06/11/2016   CL 107 06/11/2016   CREATININE 1.11 (H) 06/11/2016   BUN 12 06/11/2016   CO2 27 06/11/2016   TSH 1.16 07/01/2013   INR 1.2 (H) 02/02/2015   HGBA1C 7.1 06/18/2016   MICROALBUR 28.9 (H) 09/26/2015    Dg Chest 2 View  Result Date: 06/11/2016 CLINICAL DATA:  Cough, wheezing, and shortness of breath for the past 5 days. Symptoms are unrelieved with nebulizer treatment. History of CHF, coronary artery disease, COPD, sarcoidosis, and diabetes. EXAM: CHEST  2 VIEW COMPARISON:  Portable chest x-ray of May 18, 2016 FINDINGS: The lungs are borderline hypoinflated. The interstitial markings are diffusely increased. There is patchy density at the right lung base and thickening of the minor fissure. There is a small amount of pleural fluid blunting the costophrenic angles on the right. The heart is top-normal in size. The central pulmonary vascularity is prominent. There is calcification in the wall of the aortic arch. The mediastinum is normal in width. The bony thorax exhibits no acute abnormality. IMPRESSION: Bilateral hypoinflation. Chronic interstitial prominence bilaterally with probable superimposed atelectasis or pneumonia at the right lung base. Small right pleural effusion. Electronically Signed   By: David  Martinique M.D.   On: 06/11/2016 13:03    Assessment & Plan:   Problem List Items Addressed This Visit    Community acquired pneumonia    Right lobar, resulting in hospital admission for copd exacerbation and acute respiratory failure. repeat chest x ray  is due in mid March       Relevant Orders   Ambulatory referral to [facility]   DM (diabetes mellitus), type 2, uncontrolled, with renal complications ([place]) - Primary   Relevant Orders   POCT glucose (manual entry) (Completed)   POCT HgB A1C (Completed)   POCT CBG (Fasting -  Glucose) (Completed)   Ambulatory  referral to [place] hypertension    reducing dose of metoprolol today for bradycardia      Relevant Medications   metoprolol succinate (TOPROL-XL) 25 MG 24 hr tablet   Hospital discharge follow-up    Patient is stable post discharge and has no new issues or questions about discharge plans at the visit today for hospital follow up. All labs , imaging studies and progress notes from admission were reviewed with patient today        Uncontrolled type 2 DM with peripheral circulatory disorder ([place])    Now well controlled Reminded patient to take her mixed insulin less than 30 minutes prior to measl to avoid hypoglycemic events like today .reducing mealtime doses by 4 units each   Lab Results  Component Value Date   HGBA1C 7.1 06/18/2016         Relevant Medications   metoprolol succinate (TOPROL-XL) 25 MG 24 hr tablet   Other Relevant Orders   Ambulatory referral to [place]    Other Visit Diagnoses    Hyperlipidemia associated with type 2 diabetes mellitus ([facility])       Relevant Medications   metoprolol succinate (TOPROL-XL) 25 MG 24 hr tablet   Other Relevant Orders   Lipid panel (Completed)   B12 deficiency       Relevant Medications   cyanocobalamin ((VITAMIN B-12)) injection 1,000 mcg (Completed)      I am having Ms. Schake start on metoprolol succinate. I am also having her maintain her budesonide-formoterol, nitroGLYCERIN, BRILINTA, acetaminophen, alendronate, benzonatate, docusate sodium, Vitamin D (Ergocalciferol), fluticasone, rosuvastatin, polyethylene glycol, traMADol, aspirin EC, Insulin Lispro Prot & Lispro, PARoxetine, hydrOXYzine, isosorbide mononitrate, B-D UF III MINI PEN NEEDLES, albuterol, glucose blood, pregabalin, albuterol, ondansetron, metoprolol tartrate, furosemide, predniSONE, levofloxacin, and losartan. We administered cyanocobalamin.  Meds ordered this encounter  Medications  . metoprolol  succinate (TOPROL-XL) 25 MG 24 hr tablet    Sig: Take 1 tablet (25 mg total) by mouth daily.    Dispense:  90 tablet    Refill:  3  . cyanocobalamin ((VITAMIN B-12)) injection 1,000 mcg    There are no discontinued medications.  Follow-up: Return in about 4 weeks (around 07/16/2016) for follow up diabetes.   [name], MD

## 2016-06-18 NOTE — Patient Instructions (Addendum)
reduce your  morning dose of insulin to 40 units,  unless you are having waffles or pancakes,  Then use 45 units for those breakfasts.     Make sure you EAT WITHIN 30 MINUTES OF YOUR INSULIN DOSES TO AVOID LOW BLOOD SUGARS   YOUR BLOOD PRESSURE IS ELEVATED BUT YOUR METOPROLOL DOSE IS TOO HIGH    REDUCE YOUR DOSE TO  1/2 TABLET OF metoprolol two times daily using the pills you have  The NEW metoprolol that's coming from mail order will be [place] BLOOD PRESSURE ONCE DAILY AT around [place] X RAY AROUND THE MIDDLE OF MARCH TO MAKE SURE THE RIGHT SIDED INFILTRATE HAS RESOLVED,

## 2016-06-18 NOTE — Progress Notes (Signed)
Pre visit review using our clinic review tool, if applicable. No additional management support is needed unless otherwise documented below in the visit note. 

## 2016-06-19 DIAGNOSIS — L97512 Non-pressure chronic ulcer of other part of right foot with fat layer exposed: Secondary | ICD-10-CM | POA: Diagnosis not present

## 2016-06-19 DIAGNOSIS — D2370 Other benign neoplasm of skin of unspecified lower limb, including hip: Secondary | ICD-10-CM | POA: Diagnosis not present

## 2016-06-19 DIAGNOSIS — E1042 Type 1 diabetes mellitus with diabetic polyneuropathy: Secondary | ICD-10-CM | POA: Diagnosis not present

## 2016-06-19 DIAGNOSIS — Z89422 Acquired absence of other left toe(s): Secondary | ICD-10-CM | POA: Diagnosis not present

## 2016-06-19 DIAGNOSIS — B351 Tinea unguium: Secondary | ICD-10-CM | POA: Diagnosis not present

## 2016-06-19 LAB — EXPECTORATED SPUTUM ASSESSMENT W REFEX TO RESP CULTURE

## 2016-06-19 LAB — EXPECTORATED SPUTUM ASSESSMENT W GRAM STAIN, RFLX TO RESP C

## 2016-06-20 NOTE — Telephone Encounter (Signed)
Patient must complete visit first [facility] should have set this up before patient left hospital.

## 2016-06-21 ENCOUNTER — Encounter: Payer: Self-pay | Admitting: Internal Medicine

## 2016-06-21 NOTE — Assessment & Plan Note (Signed)
Now well controlled Reminded patient to take her mixed insulin less than 30 minutes prior to measl to avoid hypoglycemic events like today .reducing mealtime doses by 4 units each   Lab Results  Component Value Date   HGBA1C 7.1 06/18/2016

## 2016-06-21 NOTE — Assessment & Plan Note (Signed)
Right lobar, resulting in hospital admission for copd exacerbation and acute respiratory failure. repeat chest x ray  is due in mid March

## 2016-06-21 NOTE — Assessment & Plan Note (Signed)
Patient is stable post discharge and has no new issues or questions about discharge plans at the visit today for hospital follow up. All labs , imaging studies and progress notes from admission were reviewed with patient today   

## 2016-06-21 NOTE — Assessment & Plan Note (Signed)
reducing dose of metoprolol today for bradycardia

## 2016-06-23 ENCOUNTER — Telehealth: Payer: Self-pay | Admitting: *Deleted

## 2016-06-23 DIAGNOSIS — J449 Chronic obstructive pulmonary disease, unspecified: Secondary | ICD-10-CM | POA: Diagnosis not present

## 2016-06-23 DIAGNOSIS — J189 Pneumonia, unspecified organism: Secondary | ICD-10-CM | POA: Diagnosis not present

## 2016-06-23 NOTE — Telephone Encounter (Signed)
Pamela Collier from [facility] has requested a call to discuss clarity on the Rx for metoprolol  Contact [phone]

## 2016-06-24 ENCOUNTER — Other Ambulatory Visit: Payer: Self-pay | Admitting: Internal Medicine

## 2016-06-24 NOTE — Telephone Encounter (Signed)
I called Pamela Collier- I advised her that per 06/18/16 OV note pt is to be taking Metoprolol 25 mg 24 hour tablet 1 po qd and checking her blood pressure qd and to report readings to Dr. Derrel Nip.

## 2016-06-26 DIAGNOSIS — J449 Chronic obstructive pulmonary disease, unspecified: Secondary | ICD-10-CM | POA: Diagnosis not present

## 2016-06-26 DIAGNOSIS — J189 Pneumonia, unspecified organism: Secondary | ICD-10-CM | POA: Diagnosis not present

## 2016-06-28 ENCOUNTER — Other Ambulatory Visit: Payer: Self-pay | Admitting: Internal Medicine

## 2016-06-28 DIAGNOSIS — I255 Ischemic cardiomyopathy: Secondary | ICD-10-CM

## 2016-06-29 ENCOUNTER — Emergency Department
Admission: EM | Admit: 2016-06-29 | Discharge: 2016-06-29 | Disposition: A | Discharge status: Home / Self Care | Payer: Medicare Other | Attending: Emergency Medicine | Admitting: Emergency Medicine

## 2016-06-29 ENCOUNTER — Encounter: Payer: Self-pay | Admitting: Emergency Medicine

## 2016-06-29 ENCOUNTER — Emergency Department
Admit: 2016-06-29 | Discharge: 2016-06-29 | Disposition: A | Discharge status: Home / Self Care | Payer: Medicare Other | Attending: Pulmonary Disease | Admitting: Pulmonary Disease

## 2016-06-29 ENCOUNTER — Emergency Department: Payer: Medicare Other

## 2016-06-29 DIAGNOSIS — M545 Low back pain: Secondary | ICD-10-CM | POA: Insufficient documentation

## 2016-06-29 DIAGNOSIS — Z7982 Long term (current) use of aspirin: Secondary | ICD-10-CM | POA: Diagnosis not present

## 2016-06-29 DIAGNOSIS — J189 Pneumonia, unspecified organism: Secondary | ICD-10-CM

## 2016-06-29 DIAGNOSIS — I110 Hypertensive heart disease with heart failure: Secondary | ICD-10-CM | POA: Insufficient documentation

## 2016-06-29 DIAGNOSIS — J449 Chronic obstructive pulmonary disease, unspecified: Secondary | ICD-10-CM | POA: Diagnosis not present

## 2016-06-29 DIAGNOSIS — I5032 Chronic diastolic (congestive) heart failure: Secondary | ICD-10-CM | POA: Diagnosis not present

## 2016-06-29 DIAGNOSIS — R0602 Shortness of breath: Secondary | ICD-10-CM | POA: Diagnosis not present

## 2016-06-29 DIAGNOSIS — Z79899 Other long term (current) drug therapy: Secondary | ICD-10-CM | POA: Diagnosis not present

## 2016-06-29 DIAGNOSIS — E119 Type 2 diabetes mellitus without complications: Secondary | ICD-10-CM | POA: Insufficient documentation

## 2016-06-29 DIAGNOSIS — M7918 Myalgia, other site: Secondary | ICD-10-CM

## 2016-06-29 DIAGNOSIS — R079 Chest pain, unspecified: Secondary | ICD-10-CM | POA: Diagnosis not present

## 2016-06-29 DIAGNOSIS — Z7984 Long term (current) use of oral hypoglycemic drugs: Secondary | ICD-10-CM | POA: Diagnosis not present

## 2016-06-29 DIAGNOSIS — R0789 Other chest pain: Secondary | ICD-10-CM | POA: Insufficient documentation

## 2016-06-29 LAB — HEPATIC FUNCTION PANEL
ALBUMIN: 3.3 g/dL — AB (ref 3.5–5.0)
ALK PHOS: 45 U/L (ref 38–126)
ALT: 14 U/L (ref 14–54)
AST: 18 U/L (ref 15–41)
BILIRUBIN TOTAL: 0.9 mg/dL (ref 0.3–1.2)
Bilirubin, Direct: 0.1 mg/dL (ref 0.1–0.5)
Indirect Bilirubin: 0.8 mg/dL (ref 0.3–0.9)
Total Protein: 7 g/dL (ref 6.5–8.1)

## 2016-06-29 LAB — BASIC METABOLIC PANEL
ANION GAP: 6 (ref 5–15)
BUN: 14 mg/dL (ref 6–20)
CHLORIDE: 108 mmol/L (ref 101–111)
CO2: 25 mmol/L (ref 22–32)
Calcium: 8.8 mg/dL — ABNORMAL LOW (ref 8.9–10.3)
Creatinine, Ser: 1.05 mg/dL — ABNORMAL HIGH (ref 0.44–1.00)
GFR calc Af Amer: 58 mL/min — ABNORMAL LOW (ref >60)
GFR calc non Af Amer: 50 mL/min — ABNORMAL LOW (ref >60)
GLUCOSE: 155 mg/dL — AB (ref 65–99)
POTASSIUM: 4.2 mmol/L (ref 3.5–5.1)
Sodium: 139 mmol/L (ref 135–145)

## 2016-06-29 LAB — TROPONIN I: Troponin I: 0.03 ng/mL (ref <0.03)

## 2016-06-29 LAB — CBC
HCT: 33.1 % — ABNORMAL LOW (ref 35.0–47.0)
HEMOGLOBIN: 11.1 g/dL — AB (ref 12.0–16.0)
MCH: 28.9 pg (ref 26.0–34.0)
MCHC: 33.5 g/dL (ref 32.0–36.0)
MCV: 86.2 fL (ref 80.0–100.0)
Platelets: 297 10*3/uL (ref 150–440)
RBC: 3.84 MIL/uL (ref 3.80–5.20)
RDW: 16.3 % — ABNORMAL HIGH (ref 11.5–14.5)
WBC: 7.1 10*3/uL (ref 3.6–11.0)

## 2016-06-29 LAB — LIPASE, BLOOD: Lipase: 54 U/L — ABNORMAL HIGH (ref 11–51)

## 2016-06-29 MED ORDER — LORAZEPAM 2 MG/ML IJ SOLN
1.0000 mg | Freq: Once | INTRAMUSCULAR | Status: DC
  Filled 2016-06-29: qty 1

## 2016-06-29 MED ORDER — GADOBENATE DIMEGLUMINE 529 MG/ML IV SOLN
20.0000 mL | Freq: Once | INTRAVENOUS | Status: AC | PRN
  Administered 2016-06-29: 18 mL via INTRAVENOUS

## 2016-06-29 MED ORDER — NICARDIPINE HCL IN NACL 20-0.86 MG/200ML-% IV SOLN
3.0000 mg/h | INTRAVENOUS | Status: AC
  Administered 2016-06-29: 5 mg/h via INTRAVENOUS
  Filled 2016-06-29: qty 200

## 2016-06-29 MED ORDER — LORAZEPAM 2 MG/ML IJ SOLN
0.5000 mg | Freq: Once | INTRAMUSCULAR | Status: AC
  Administered 2016-06-29: 0.5 mg via INTRAVENOUS

## 2016-06-29 MED ORDER — ESMOLOL HCL-SODIUM CHLORIDE 2000 MG/100ML IV SOLN
25.0000 ug/kg/min | Freq: Once | INTRAVENOUS | Status: AC
  Administered 2016-06-29: 25 ug/kg/min via INTRAVENOUS
  Filled 2016-06-29: qty 100

## 2016-06-29 MED ORDER — DIPHENHYDRAMINE HCL 50 MG/ML IJ SOLN
INTRAMUSCULAR | Status: AC
  Administered 2016-06-29: 11:00:00
  Filled 2016-06-29: qty 1

## 2016-06-29 MED ORDER — NAPROXEN 500 MG PO TABS: 500.0000 mg | ORAL_TABLET | Freq: Two times a day (BID) | ORAL | 0 refills | Status: AC

## 2016-06-29 NOTE — ED Provider Notes (Signed)
[facility] Emergency Department Provider Note  ____________________________________________  Time seen: Approximately 10:44 AM  I have reviewed the triage vital signs and the nursing notes.   HISTORY  Chief Complaint Chest Pain and Back Pain  Level 5 caveat:  Portions of the history and physical were unable to be obtained due to the patient's acute illness   HPI Pamela Collier is a 76 y.o. female who complains of sudden onset of sharp left-sided chest pain radiating to the back and abdomen at 8 AM today. Associated with bilateral leg numbness. No weakness or falls. No aggravating or alleviating factors. Also reports some left-sided headache. Denies any fall syncope or trauma.     Past Medical History:  Diagnosis Date  . Acute respiratory failure ([place]) 11/21/2014  . Arthritis 08/05/2013  . Atherosclerotic peripheral vascular disease with gangrene [facility]) august 2012  . Cardiomyopathy, ischemic    a. EF 35 to 40% by echo in 2013 b. EF improved to 50-55% by echo in 04/2015.  [name] Cerebral infarct ([place]) 08/17/2013  . Chronic diastolic CHF (congestive heart failure) (HCC)    a. EF 50-55% by echo in 04/2015  . COPD (chronic obstructive pulmonary disease) ([place])   . Coronary artery disease, occlusive    a. Previous PCI to the LAD, LCx, and RCA in 2010, 2011, 2013, and 2016. All performed in [place].  . 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 Diabetic diarrhea ([place]) 10/03/2014  . DM type 2, uncontrolled, with renal complications ([place]) 11/16/6765  . Hepatic steatosis    by CT abd pelvis  . Hyperlipidemia LDL goal <100 02/23/2014  . Hypertensive heart disease   . Osteoporosis, post-menopausal   . 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 Pleural effusion 10/25/2012   10/2012 CT chest >> small to moderate R lung effusion>> chylothorax, 100% lymphs 10/2013 thoracentesis> cytology negative, WBC  1471, > 90% "small lymphs" 01/2014 CT chest> near complete resolution of pleural effusion, stable lymphadenopathy 08/2014 CT chest [place] ([facility]): small right sided effusion decreased in size, stable mediastinal lymphadenopathy 1.0cm largest, 65m  . 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 Renal insufficiency   . Sarcoidosis [facility])      Patient Active Problem List   Diagnosis Date Noted  . COPD exacerbation ([place]) 06/11/2016  . Epigastric hernia 04/01/2016  . Umbilical pain 20/94/7096  . Coronary artery disease involving native coronary artery of native heart with unstable angina pectoris ([place])   . Uncontrolled type 2 DM with peripheral circulatory disorder ([place])   . Chronic renal insufficiency   . Otitis media 12/23/2015  . Type 2 diabetes mellitus, uncontrolled, with neuropathy ([place]) 09/27/2015  . Shortness of breath 09/12/2015  . Hypertensive heart disease   . OSA on CPAP 07/07/2015  . Hospital discharge follow-up 07/07/2015  . Chest pain with low risk of acute coronary syndrome 06/29/2015  . Community acquired pneumonia   . Pleural effusion, right   . Fall 04/19/2015  . Acute on chronic kidney failure ([place]) 04/19/2015  . Chronic diastolic CHF (congestive heart failure) ([place]) 04/18/2015  . Diabetic neuropathy ([place]) 02/21/2015  . Benign neoplasm of sigmoid  colon   . Diabetic diarrhea ([place]) 10/03/2014  . Low back pain with radiation 04/04/2014  . Essential hypertension 04/04/2014  . Hyperlipidemia 02/23/2014  . Long-term use of high-risk medication 02/23/2014  . Dermatophytic onychia 08/17/2013  . Angina pectoris ([place]) 08/17/2013  . Arteriosclerosis of coronary artery 08/17/2013  . Cerebral infarct ([place]) 08/17/2013  . Bony  exostosis 08/17/2013  . Arthritis 08/05/2013  . History of colonic polyps 02/20/2013  . Sarcoidosis ([place]) 12/07/2012  . Dysphagia 12/07/2012  . Nocturnal cough 10/25/2012  . Pulmonary nodule seen on imaging study 05/20/2012  . 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 Cardiomyopathy, ischemic   . Hepatic steatosis   . Osteoporosis, post-menopausal   . Peripheral vascular disease due to secondary diabetes mellitus ([place])   . DM (diabetes mellitus), type 2, uncontrolled, with renal complications ([place]) 16/02/9603  . Double vessel coronary artery disease 12/14/2011     Past Surgical History:  Procedure Laterality Date  . ABDOMINAL HYSTERECTOMY     at ge 39. secondary to bleeding/partial  . BREAST CYST ASPIRATION Right   . CARPAL TUNNEL RELEASE Bilateral   . CHOLECYSTECTOMY     in [place]   . COLONOSCOPY WITH PROPOFOL N/A 01/09/2015   Procedure: COLONOSCOPY WITH PROPOFOL;  Surgeon: Lucilla Lame, MD;  Location: [facility] ENDOSCOPY;  Service: Endoscopy;  Laterality: N/A;  . CORONARY ANGIOPLASTY WITH STENT PLACEMENT     [place]; [facility]  . HERNIA REPAIR     umbilical/ Dr Pat Patrick  . PTCA  August 2012   Right Posterior tibial artery , [place]  . TOE AMPUTATION  Sept 2012   Right 2nd toe, [place]     Prior to Admission medications   Medication Sig Start Date End Date Taking? Authorizing Provider  acetaminophen (TYLENOL) 500 MG tablet Take 500 mg by mouth every 6 (six) hours as needed for mild pain or headache.   Yes Historical Provider, MD  albuterol (PROAIR HFA) 108 (90 Base) MCG/ACT inhaler Inhale 2 puffs into the lungs every 6 (six) hours as needed for wheezing or shortness of breath. 02/26/16  Yes [name], MD  albuterol (PROVENTIL) (2.5 MG/3ML) 0.083% nebulizer solution Take 3 mLs (2.5 mg total) by nebulization every 6 (six) hours as needed for wheezing or shortness of breath. 05/06/16  Yes [name], MD  alendronate (FOSAMAX) 70 MG tablet Take 70 mg by mouth once a week. Pt takes on Sunday.   Take with a full glass of water on an empty stomach.   Yes Historical Provider, MD  benzonatate (TESSALON) 100 MG capsule Take 200 mg by mouth 3 (three) times daily as needed for cough. Reported on 11/09/2015   Yes Historical Provider, MD  docusate sodium (COLACE) 100 MG capsule Take 100 mg by mouth 2 (two) times daily as needed for mild constipation. Reported on 11/09/2015   Yes Historical Provider, MD  fluticasone (FLONASE) 50 MCG/ACT nasal spray Place 2 sprays into both nostrils daily as needed for rhinitis.   Yes Historical Provider, MD  ondansetron (ZOFRAN) 4 MG tablet Take 1 tablet (4 mg total) by mouth every 8 (eight) hours as needed for nausea or vomiting. 05/18/16  Yes Rudene Re, MD  polyethylene glycol [facility] / GLYCOLAX) packet Take 17 g by mouth daily as needed for mild constipation. Reported on 10/25/2015   Yes Historical Provider, MD  traMADol (ULTRAM) 50 MG tablet Take 50 mg by mouth every 6 (six)  hours as needed for moderate pain.   Yes Historical Provider, MD  aspirin EC 81 MG tablet Take 81 mg by mouth daily.    Historical Provider, MD  B-D UF III MINI PEN NEEDLES 31G X 5 MM MISC USE THREE TIMES A DAY 02/23/16   [name], MD  BRILINTA 90 MG TABS tablet Take 1 tablet (90 mg total) by mouth 2 (two) times daily. 10/16/15   [name], MD  budesonide-formoterol (SYMBICORT) 160-4.5 MCG/ACT inhaler Inhale 2 puffs into the lungs 2 (two) times daily. 02/23/15   Juanito Doom, MD  furosemide (LASIX) 20 MG tablet Take 1 tablet (20 mg total) by mouth daily. 06/11/16   [name], MD  glucose blood (ACCU-CHEK AVIVA PLUS) test strip USE TO CHECK GLUCOSE  THREE TIMES DAILY 04/29/16   [name], MD  hydrOXYzine (ATARAX/VISTARIL) 10 MG tablet Take 1 tablet (10 mg total) by mouth 3 (three) times daily as needed. 01/08/16   Ponciano Ort, MD  Insulin Lispro Prot & Lispro  (HUMALOG MIX 75/25 KWIKPEN) (75-25) 100 UNIT/ML Kwikpen Inject 45 to 60 units twice daily before morning and evening meals 11/27/15   [name], MD  isosorbide mononitrate (IMDUR) 30 MG 24 hr tablet TAKE 1 TABLET DAILY 01/17/16   [name], MD  levofloxacin (LEVAQUIN) 750 MG tablet Take 1 tablet (750 mg total) by mouth daily. 06/12/16   Fritzi Mandes, MD  losartan (COZAAR) 100 MG tablet TAKE 1 TABLET DAILY 06/13/16   [name], MD  metoprolol succinate (TOPROL-XL) 25 MG 24 hr tablet Take 1 tablet (25 mg total) by mouth daily. 06/18/16   [name], MD  naproxen (NAPROSYN) 500 MG tablet Take 1 tablet (500 mg total) by mouth 2 (two) times daily with a meal. 06/29/16 07/04/16  Carrie Mew, MD  nitroGLYCERIN (NITROSTAT) 0.4 MG SL tablet Place 1 tablet (0.4 mg total) under the tongue every 5 (five) minutes as needed for chest pain. 04/18/15   Minna Merritts, MD  PARoxetine (PAXIL) 20 MG tablet TAKE ONE TABLET BY MOUTH ONCE DAILY 06/25/16   [name], MD  predniSONE (DELTASONE) 10 MG tablet Take 50 mg daily---taper by 10 mg daily then stop 06/13/16   Fritzi Mandes, MD  pregabalin (LYRICA) 100 MG capsule Take 1 capsule (100 mg total) by mouth 3 (three) times daily. 04/29/16   [name], MD  rosuvastatin (CRESTOR) 20 MG tablet Take 20 mg by mouth at bedtime.    Historical Provider, MD  Vitamin D, Ergocalciferol, (DRISDOL) 50000 units CAPS capsule Take 50,000 Units by mouth every 7 (seven) days. Pt takes on Sunday.    Historical Provider, MD     Allergies Cephalexin; Contrast media [iodinated diagnostic agents]; Doxycycline; Gabapentin; and Sulfa antibiotics   Family History  Problem Relation Age of Onset  . Heart disease Father   . Heart attack Father   . Heart disease Sister   . Heart attack Sister   . Heart disease Brother   . Heart attack Brother   . Asthma Grandchild   . Breast cancer Maternal Aunt     Social History Social History  Substance Use Topics  . Smoking status:  Never Smoker  . Smokeless tobacco: Never Used  . Alcohol use No    Review of Systems  Constitutional:   No fever or chills.  ENT:   No sore throat. No rhinorrhea. Cardiovascular:   Positive as above chest pain. Respiratory:  No dyspnea or cough. Gastrointestinal:   Negative for abdominal pain, vomiting and diarrhea.  Genitourinary:   Negative for dysuria or difficulty urinating. Musculoskeletal:   Negative for focal pain or swelling Neurological:   Positive for headaches 10-point ROS otherwise negative.  ____________________________________________   PHYSICAL EXAM:  VITAL SIGNS: ED Triage Vitals [06/29/16 1035]  Enc Vitals Group     BP (!) 167/56     Pulse Rate 66     Resp 16     Temp 98.2 F (36.8 C)     Temp src      SpO2 98 %     Weight      Height      Head Circumference      Peak Flow      Pain Score 8     Pain Loc      Pain Edu?      Excl. in [place]?     Vital signs reviewed, nursing assessments reviewed. +BP differential between LLE and RLE.   Constitutional:   Alert and oriented. Ill-appearing. Eyes:   No scleral icterus. No conjunctival pallor. PERRL. EOMI.  No nystagmus. ENT   Head:   Normocephalic and atraumatic.   Nose:   No congestion/rhinnorhea. No septal hematoma   Mouth/Throat:   MMM, no pharyngeal erythema. No peritonsillar mass.    Neck:   No stridor. No SubQ emphysema. No meningismus. Hematological/Lymphatic/Immunilogical:   No cervical lymphadenopathy. Cardiovascular:   RRR. Symmetric bilateral radial and DP pulses.  No murmurs.  Respiratory:   Normal respiratory effort without tachypnea nor retractions. Breath sounds are clear and equal bilaterally. No wheezes/rales/rhonchi. Gastrointestinal:   Soft and nontender. Non distended. There is no CVA tenderness.  No rebound, rigidity, or guarding. Genitourinary:   deferred Musculoskeletal:   Nontender with normal range of motion in all extremities. No joint effusions.  No lower  extremity tenderness.  No edema. Neurologic:   Normal speech and language.  CN 2-10 normal. Motor grossly intact. No gross focal neurologic deficits are appreciated.  Skin:    Skin is warm, dry and intact. No rash noted.  No petechiae, purpura, or bullae.  ____________________________________________    LABS (pertinent positives/negatives) (all labs ordered are listed, but only abnormal results are displayed) Labs Reviewed  BASIC METABOLIC PANEL - Abnormal; Notable for the following:       Result Value   Glucose, Bld 155 (*)    Creatinine, Ser 1.05 (*)    Calcium 8.8 (*)    GFR calc non Af Amer 50 (*)    GFR calc Af Amer 58 (*)    All other components within normal limits  CBC - Abnormal; Notable for the following:    Hemoglobin 11.1 (*)    HCT 33.1 (*)    RDW 16.3 (*)    All other components within normal limits  TROPONIN I - Abnormal; Notable for the following:    Troponin I 0.03 (*)    All other components within normal limits  HEPATIC FUNCTION PANEL - Abnormal; Notable for the following:    Albumin 3.3 (*)    All other components within normal limits  LIPASE, BLOOD - Abnormal; Notable for the following:    Lipase 54 (*)    All other components within normal limits  TROPONIN I   ____________________________________________   EKG  Interpreted by me Sinus rhythm rate of 66, PR interval of 240 ms with first-degree AV block. Left axis. Voltage criteria for LVH in the high lateral  leads. Right bundle-branch block. No ST segment changes or acute ischemic changes. Unchanged from 06/13/2016.  ____________________________________________    RADIOLOGY  Chest x-ray unremarkable. MR angiogram chest negative  ____________________________________________   PROCEDURES Procedures CRITICAL CARE Performed by: Joni Fears, Romyn Boswell   Total critical care time: 35 minutes  Critical care time was exclusive of separately billable procedures and treating other  patients.  Critical care was necessary to treat or prevent imminent or life-threatening deterioration.  Critical care was time spent personally by me on the following activities: development of treatment plan with patient and/or surrogate as well as nursing, discussions with consultants, evaluation of patient's response to treatment, examination of patient, obtaining history from patient or surrogate, ordering and performing treatments and interventions, ordering and review of laboratory studies, ordering and review of radiographic studies, pulse oximetry and re-evaluation of patient's condition.  ____________________________________________   INITIAL IMPRESSION / ASSESSMENT AND PLAN / ED COURSE  Pertinent labs & imaging results that were available during my care of the patient were reviewed by me and considered in my medical decision making (see chart for details).  Patient presents with sudden onset sharp severe radiating chest pain. Concern for aortic dissection. Labs sent. Initially patient unable to recall ever having an IV contrast allergy, chart reports an itching reaction. She was given IV Benadryl for this and sent to CT, but by the time she arrived in CT she remembered that she actually gets short of breath and sounds like has an anaphylactic reaction. I discussed this with radiology, reviewed the CT will not be safe, I'll treat the patient with IV as small and nicardipine for heart rate and blood pressure control, and obtain an MR angiogram of the chest.     Clinical Course as of Jun 29 1540  Sun Jun 29, 2016  1120 Imaging interpret by me. No gross airspace disease. There is a loss of the normal contour of the aortic knob without a grossly widened mediastinum. Unchanged from previous. DG Chest 2 View [PS]  7939 Creat/GFR acceptable. Esmolol infusing, HR 60. Will start nicardipine. Pt ready for MRI.  Creatinine: (!) 1.05 [PS]  1520 Sx improved, now just has body aches. Recently tx'd  for pna with steroids and levaquin.  Possibly soft tissue /msk related pain from side effects.   [PS]    Clinical Course User Index [PS] Carrie Mew, MD    ----------------------------------------- 3:42 PM on 06/29/2016 -----------------------------------------  Able to ambulate around the room twice with steady gait, intact coordination and balance with a walker. She has a walker at home. She has an appointment with pulmonology tomorrow and appointment with her primary care doctor, Dr. Derrel Nip in 2 days. I encouraged her to keep all these appointments. Rest, brief course of Aleve for anti-inflammatory effect. ____________________________________________   FINAL CLINICAL IMPRESSION(S) / ED DIAGNOSES  Final diagnoses:  Chest pain  Musculoskeletal pain      New Prescriptions   NAPROXEN (NAPROSYN) 500 MG TABLET    Take 1 tablet (500 mg total) by mouth 2 (two) times daily with a meal.     Portions of this note were generated with dragon dictation software. Dictation errors may occur despite best attempts at proofreading.    Carrie Mew, MD 06/29/16 [phone]

## 2016-06-29 NOTE — ED Notes (Signed)
Patient brought back from MRI

## 2016-06-29 NOTE — ED Triage Notes (Signed)
Pt states that this morning around 8am she started having left chest pain, lower back, pain and bilateral leg numbness. Pt denies N/V but does report shortness of breath.

## 2016-06-29 NOTE — Discharge Instructions (Signed)
Rest, take all home medications, and use your walker for mobility at home.  Keep all your appointments this week.

## 2016-06-29 NOTE — ED Notes (Signed)
Patient transported to MRI 

## 2016-06-29 NOTE — ED Notes (Signed)
Patient transported to X-ray 

## 2016-06-29 NOTE — ED Notes (Signed)
Patient transported to CT 

## 2016-06-30 ENCOUNTER — Ambulatory Visit (INDEPENDENT_AMBULATORY_CARE_PROVIDER_SITE_OTHER): Payer: Medicare Other | Admitting: Pulmonary Disease

## 2016-06-30 ENCOUNTER — Encounter: Payer: Self-pay | Admitting: Pulmonary Disease

## 2016-06-30 ENCOUNTER — Inpatient Hospital Stay: Payer: Medicare Other | Admitting: Pulmonary Disease

## 2016-06-30 VITALS — BP 138/70 | HR 62 | Wt 188.0 lb

## 2016-06-30 DIAGNOSIS — R942 Abnormal results of pulmonary function studies: Secondary | ICD-10-CM

## 2016-06-30 DIAGNOSIS — I255 Ischemic cardiomyopathy: Secondary | ICD-10-CM | POA: Diagnosis not present

## 2016-06-30 DIAGNOSIS — R0609 Other forms of dyspnea: Secondary | ICD-10-CM

## 2016-06-30 DIAGNOSIS — D860 Sarcoidosis of lung: Secondary | ICD-10-CM

## 2016-06-30 DIAGNOSIS — R531 Weakness: Secondary | ICD-10-CM | POA: Diagnosis not present

## 2016-06-30 NOTE — Patient Instructions (Addendum)
1) Continue Symbicort inhaler 2) Continue albuterol inhaler as needed 3) Follow up in 3 months with Dr Ashby Dawes

## 2016-06-30 NOTE — Progress Notes (Signed)
PULMONARY OFFICE FOLLOW UP NOTE  PROBLEMS:  OSA - on CPAP History of pulmonary sarcoidosis Chronic bronchitis Severe chronic debilitation  DATA: Spirometry 03/15/13: moderate restrictive pattern  INTERVAL HISTORY: Hospitalized 01/31 - 06/12/16 for "PNA". Discharged home on course of levofloxacin and prednisone taper. Instructed to follow up in our clinic  SUBJ: This is post hospital follow up. Her pulmonary status is at baseline. She is more limited by weakness than by dyspnea. She continues on Symbicort and PRN albuterol inhalers. Denies CP, fever, purulent sputum, hemoptysis, LE edema and calf tenderness. She was seen in ED 02/18 with sudden onset of chest pain radiating to back and LE numbness. MRI angiogram of chest was performed without evidence of aortic dissection and she was sent home with dx of musculoskeletal pain.  OBJ: Vitals:   06/30/16 1338 06/30/16 1340  BP:  138/70  Pulse:  62  SpO2:  99%  Weight: 188 lb (85.3 kg)   RA  Frail appearing, tremor, no overt respiratory distress HEENT WNL No LAN or JVD Mild kyphosis, faint bibasilar crackles, no wheezes Reg, II/VI SEM Obese, soft, NT Atrophy of B thenar eminences No LE edema  DATA: CXR 06/29/16: Chronic interstitial prominence, similar to prior studies, and mild cardiomegaly. No acute findings demonstrated  IMPRESSION: 1) History of pulmonary sarcoidosis 2) History of chronic pleural effusion - minimal on most recent CXR 3) Recent brief hospitalization for "PNA" - suspect most of Xray findings are chronic residua from sarcoidosis 4) Back pain, LE weakness, atrophy on muscles in B hands  PLAN: 1) Continue Symbicort inhaler 2) Continue albuterol inhaler as needed 3) Directed her to Dr Derrel Nip for further eval of back pain and weakness. I am not sure whether the muscle atrophy in B hands is new or chronic but I will call it to Dr Lupita Dawn attention 4) Follow up in 3 months with Dr Pincus Sanes,  MD PCCM service Mobile [phone] Pager [phone] 06/30/2016

## 2016-07-01 ENCOUNTER — Ambulatory Visit (INDEPENDENT_AMBULATORY_CARE_PROVIDER_SITE_OTHER): Payer: Medicare Other

## 2016-07-01 ENCOUNTER — Ambulatory Visit (INDEPENDENT_AMBULATORY_CARE_PROVIDER_SITE_OTHER): Payer: Medicare Other | Admitting: Internal Medicine

## 2016-07-01 ENCOUNTER — Encounter: Payer: Self-pay | Admitting: Internal Medicine

## 2016-07-01 VITALS — BP 132/70 | HR 67 | Resp 16 | Wt 187.0 lb

## 2016-07-01 VITALS — BP 132/70 | HR 67 | Temp 97.7°F | Resp 16 | Ht 67.0 in | Wt 187.0 lb

## 2016-07-01 DIAGNOSIS — R0789 Other chest pain: Secondary | ICD-10-CM | POA: Diagnosis not present

## 2016-07-01 DIAGNOSIS — M546 Pain in thoracic spine: Secondary | ICD-10-CM

## 2016-07-01 DIAGNOSIS — I129 Hypertensive chronic kidney disease with stage 1 through stage 4 chronic kidney disease, or unspecified chronic kidney disease: Secondary | ICD-10-CM | POA: Diagnosis not present

## 2016-07-01 DIAGNOSIS — M5412 Radiculopathy, cervical region: Secondary | ICD-10-CM | POA: Diagnosis not present

## 2016-07-01 DIAGNOSIS — S299XXA Unspecified injury of thorax, initial encounter: Secondary | ICD-10-CM | POA: Diagnosis not present

## 2016-07-01 DIAGNOSIS — Z0000 Encounter for general adult medical examination without abnormal findings: Secondary | ICD-10-CM | POA: Diagnosis not present

## 2016-07-01 DIAGNOSIS — R079 Chest pain, unspecified: Secondary | ICD-10-CM

## 2016-07-01 DIAGNOSIS — M542 Cervicalgia: Secondary | ICD-10-CM | POA: Diagnosis not present

## 2016-07-01 DIAGNOSIS — I255 Ischemic cardiomyopathy: Secondary | ICD-10-CM

## 2016-07-01 DIAGNOSIS — E1122 Type 2 diabetes mellitus with diabetic chronic kidney disease: Secondary | ICD-10-CM

## 2016-07-01 DIAGNOSIS — M810 Age-related osteoporosis without current pathological fracture: Secondary | ICD-10-CM

## 2016-07-01 DIAGNOSIS — N183 Chronic kidney disease, stage 3 unspecified: Secondary | ICD-10-CM

## 2016-07-01 DIAGNOSIS — S199XXA Unspecified injury of neck, initial encounter: Secondary | ICD-10-CM | POA: Diagnosis not present

## 2016-07-01 MED ORDER — ALENDRONATE SODIUM 70 MG PO TABS: 70.0000 mg | ORAL_TABLET | ORAL | 11 refills | Status: DC

## 2016-07-01 NOTE — Progress Notes (Signed)
Subjective:   Pamela Collier is a 76 y.o. female who presents for an Initial Medicare Annual Wellness Visit.  Review of Systems    No ROS.  Medicare Wellness Visit.  Cardiac Risk Factors include: advanced age (>91men, >18 women);hypertension;obesity (BMI >30kg/m2);diabetes mellitus     Objective:    Today's Vitals   07/01/16 1520  BP: 132/70  Pulse: 67  Resp: 16  Temp: 97.7 F (36.5 C)  TempSrc: Oral  SpO2: 96%  Weight: 187 lb (84.8 kg)  Height: 5\' 7"  (1.702 m)   Body mass index is 29.29 kg/m.   Current Medications (verified) Outpatient Encounter Prescriptions as of 07/01/2016  Medication Sig  . acetaminophen (TYLENOL) 500 MG tablet Take 500 mg by mouth every 6 (six) hours as needed for mild pain or headache.  . albuterol (PROAIR HFA) 108 (90 Base) MCG/ACT inhaler Inhale 2 puffs into the lungs every 6 (six) hours as needed for wheezing or shortness of breath.  [name] albuterol (PROVENTIL) (2.5 MG/3ML) 0.083% nebulizer solution Take 3 mLs (2.5 mg total) by nebulization every 6 (six) hours as needed for wheezing or shortness of breath.  [name] alendronate (FOSAMAX) 70 MG tablet Take 1 tablet (70 mg total) by mouth once a week. Pt takes on Sunday.   Take with a full glass of water on an empty stomach.  [name] aspirin EC 81 MG tablet Take 81 mg by mouth daily.  . B-D UF III MINI PEN NEEDLES 31G X 5 MM MISC USE THREE TIMES A DAY  . benzonatate (TESSALON) 100 MG capsule Take 200 mg by mouth 3 (three) times daily as needed for cough. Reported on 11/09/2015  . BRILINTA 90 MG TABS tablet TAKE 1 TABLET TWICE A DAY  . budesonide-formoterol (SYMBICORT) 160-4.5 MCG/ACT inhaler Inhale 2 puffs into the lungs 2 (two) times daily.  [name] docusate sodium (COLACE) 100 MG capsule Take 100 mg by mouth 2 (two) times daily as needed for mild constipation. Reported on 11/09/2015  . fluticasone (FLONASE) 50 MCG/ACT nasal spray Place 2 sprays into both nostrils daily as needed for rhinitis.  . furosemide (LASIX) 20  MG tablet Take 1 tablet (20 mg total) by mouth daily.  [name] glucose blood (ACCU-CHEK AVIVA PLUS) test strip USE TO CHECK GLUCOSE  THREE TIMES DAILY  . hydrOXYzine (ATARAX/VISTARIL) 10 MG tablet Take 1 tablet (10 mg total) by mouth 3 (three) times daily as needed.  . Insulin Lispro Prot & Lispro (HUMALOG MIX 75/25 KWIKPEN) (75-25) 100 UNIT/ML Kwikpen Inject 45 to 60 units twice daily before morning and evening meals  . isosorbide mononitrate (IMDUR) 30 MG 24 hr tablet TAKE 1 TABLET DAILY  . losartan (COZAAR) 100 MG tablet TAKE 1 TABLET DAILY  . metoprolol succinate (TOPROL-XL) 25 MG 24 hr tablet Take 1 tablet (25 mg total) by mouth daily.  . naproxen (NAPROSYN) 500 MG tablet Take 1 tablet (500 mg total) by mouth 2 (two) times daily with a meal.  . nitroGLYCERIN (NITROSTAT) 0.4 MG SL tablet Place 1 tablet (0.4 mg total) under the tongue every 5 (five) minutes as needed for chest pain.  [name] ondansetron (ZOFRAN) 4 MG tablet Take 1 tablet (4 mg total) by mouth every 8 (eight) hours as needed for nausea or vomiting.  [name] PARoxetine (PAXIL) 20 MG tablet TAKE ONE TABLET BY MOUTH ONCE DAILY  . polyethylene glycol (MIRALAX / GLYCOLAX) packet Take 17 g by mouth daily as needed for mild constipation. Reported on 10/25/2015  . pregabalin (LYRICA) 100 MG  capsule Take 1 capsule (100 mg total) by mouth 3 (three) times daily.  . rosuvastatin (CRESTOR) 20 MG tablet Take 20 mg by mouth at bedtime.  . traMADol (ULTRAM) 50 MG tablet Take 50 mg by mouth every 6 (six) hours as needed for moderate pain.  . Vitamin D, Ergocalciferol, (DRISDOL) 50000 units CAPS capsule Take 50,000 Units by mouth every 7 (seven) days. Pt takes on Sunday.  . [DISCONTINUED] predniSONE (DELTASONE) 10 MG tablet Take 50 mg daily---taper by 10 mg daily then stop  . [DISCONTINUED] alendronate (FOSAMAX) 70 MG tablet Take 70 mg by mouth once a week. Pt takes on Sunday.   Take with a full glass of water on an empty stomach.   Facility-Administered Encounter  Medications as of 07/01/2016  Medication  . albuterol (PROVENTIL) (5 MG/ML) 0.5% nebulizer solution 2.5 mg    Allergies (verified) Cephalexin; Contrast media [iodinated diagnostic agents]; Doxycycline; Gabapentin; and Sulfa antibiotics   History: Past Medical History:  Diagnosis Date  . Acute respiratory failure ([place]) 11/21/2014  . Arthritis 08/05/2013  . Atherosclerotic peripheral vascular disease with gangrene [facility]) august 2012  . Cardiomyopathy, ischemic    a. EF 35 to 40% by echo in 2013 b. EF improved to 50-55% by echo in 04/2015.  [name] Cerebral infarct ([place]) 08/17/2013  . Chronic diastolic CHF (congestive heart failure) ([facility])    a. EF 50-55% by echo in 04/2015  . COPD (chronic obstructive pulmonary disease) ([place])   . Coronary artery disease, occlusive    a. Previous PCI to the LAD, LCx, and RCA in 2010, 2011, 2013, and 2016. All performed in [place].  . 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 Diabetic diarrhea ([place]) 10/03/2014  . DM type 2, uncontrolled, with renal complications ([place]) 12/10/173  . Hepatic steatosis    by CT abd pelvis  . Hyperlipidemia LDL goal <100 02/23/2014  . Hypertensive heart disease   . Osteoporosis, post-menopausal   . 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 Pleural effusion 10/25/2012   10/2012 CT chest >> small to moderate R lung effusion>> chylothorax, 100% lymphs 10/2013 thoracentesis> cytology negative, WBC 1471, > 90% "small lymphs" 01/2014 CT chest> near complete resolution of pleural effusion, stable lymphadenopathy 08/2014 CT chest [place] ([facility]): small right sided effusion decreased in size, stable mediastinal lymphadenopathy 1.0cm largest, 68m  . 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 Renal insufficiency   . Sarcoidosis [facility])    Past Surgical History:  Procedure Laterality Date  . ABDOMINAL HYSTERECTOMY     at ge 39. secondary to bleeding/partial  . BREAST CYST ASPIRATION Right   . CARPAL TUNNEL RELEASE Bilateral   . CHOLECYSTECTOMY     in [place]   . COLONOSCOPY WITH PROPOFOL N/A 01/09/2015   Procedure: COLONOSCOPY WITH PROPOFOL;  Surgeon: Lucilla Lame, MD;  Location: [facility] ENDOSCOPY;  Service: Endoscopy;  Laterality: N/A;  . CORONARY ANGIOPLASTY WITH STENT PLACEMENT     [place]; [facility]  . HERNIA REPAIR     umbilical/ Dr Pat Patrick  . PTCA  August 2012   Right Posterior tibial artery , [place]  . TOE  AMPUTATION  Sept 2012   Right 2nd toe, [place]   Family History  Problem Relation Age of Onset  . Heart disease Father   . Heart attack Father   . Heart disease Sister   . Heart attack Sister   . Heart disease Brother   . Heart attack Brother   . Asthma Grandchild   . Breast cancer Maternal Aunt    Social History   Occupational History  . retired    Social History Main Topics  . Smoking status: Never Smoker  . Smokeless tobacco: Never Used  . Alcohol use No  . Drug use: No  . Sexual activity: No    Tobacco Counseling Counseling given: Not Answered   Activities of Daily Living In your present state of health, do you have any difficulty performing the following activities: 07/01/2016 06/12/2016  Hearing? N N  Vision? N N  Difficulty concentrating or making decisions? Tempie Donning  Walking or climbing stairs? Y Y  Dressing or bathing? N N  Doing errands, shopping? Y N  Preparing Food and eating ? N -  Using the Toilet? N -  In the past six months, have you accidently leaked urine? N -  Do you have problems with loss of bowel control? N -  Managing your Medications? N -  Managing your Finances? N -  Housekeeping or managing your Housekeeping? N -  Some recent data  might be hidden    Immunizations and Health Maintenance Immunization History  Administered Date(s) Administered  . Influenza Split 03/23/2012  . Influenza, High Dose Seasonal PF 01/15/2016  . Influenza,inj,Quad PF,36+ Mos 02/03/2013, 02/20/2014, 02/21/2015  . Influenza-Unspecified 04/22/2015  . PPD Test 04/26/2015, 05/07/2015  . Pneumococcal Conjugate-13 07/01/2013  . Pneumococcal Polysaccharide-23 02/26/2012  . Pneumococcal-Unspecified 04/22/2015   Health Maintenance Due  Topic Date Due  . TETANUS/TDAP  05/01/1960    Patient Care Team: [name], MD as PCP - General (Internal Medicine) Juanito Doom, MD as Consulting Physician (Pulmonary Disease) Minna Merritts, MD as Consulting Physician (Cardiology) [name], MD (Internal Medicine) Robert Bellow, MD (General Surgery)  Indicate any recent Medical Services you may have received from other than [facility] providers in the past year (date may be approximate).     Assessment:   This is a routine wellness examination for Pamela Collier. The goal of the wellness visit is to assist the patient how to close the gaps in care and create a preventative care plan for the patient.   Taking calcium VIT D as appropriate/Osteoporosis reviewed.   Medications reviewed; taking without issues or barriers.  Safety issues reviewed; smoke detectors in the home. No firearms in the home. Wears seatbelts when driving or riding with others. No violence in the home. Cane or walker in use when ambulating.  No identified risk were noted; The patient was oriented x 3; appropriate in dress and manner and no objective failures at ADL's or IADL's.   BMI; discussed the importance of a healthy diet, water intake and exercise. Educational material provided.  HTN; followed by PCP.  Patient Concerns: None at this time. Follow up with PCP as needed.  Hearing/Vision screen Hearing Screening Comments: Patient passes the whisper test Vision  Screening Comments: Followed by Dr. [facility]) Cataract extraction, bilateral Visual acuity not assessed per patient preference since she has regular follow up with her ophthalmologist  Dietary issues and exercise activities discussed: Current Exercise Habits: The patient does not participate in regular exercise at present  Goals    . Increase physical activity          Physical therapy as directed.      Depression Screen PHQ 2/9 Scores 07/01/2016 02/12/2016 12/03/2015 10/10/2015 10/30/2014 10/30/2014 10/20/2014  PHQ - 2 Score 0 3 0 1 0 0 3  PHQ- 9 Score - 7 - - - - 5  Exception Documentation - - - - - - -    Fall Risk Fall Risk  07/01/2016 02/12/2016 12/03/2015 10/10/2015 07/16/2015  Falls in the past year? Yes No Yes Yes -  Number falls in past yr: 1 - 2 or more 1 -  Injury with Fall? No - No Yes -  Risk Factor Category  - - High Fall Risk - -  Risk for fall due to : Impaired balance/gait - History of fall(s);Other (Comment) Impaired mobility Impaired balance/gait  Risk for fall due to (comments): - - occasional episodes of dizziness, lightheaded feeling use cane  -  Follow up Falls prevention discussed;Education provided - Education provided Falls prevention discussed -    Cognitive Function:     6CIT Screen 07/01/2016  What Year? 0 points  What month? 0 points  What time? 0 points  Count back from 20 4 points  Months in reverse 4 points    Screening Tests Health Maintenance  Topic Date Due  . TETANUS/TDAP  05/01/1960  . OPHTHALMOLOGY EXAM  08/01/2016  . FOOT EXAM  09/25/2016  . HEMOGLOBIN A1C  12/16/2016  . INFLUENZA VACCINE  Completed  . DEXA SCAN  Completed  . PNA vac Low Risk Adult  Completed      Plan:    End of life planning; Advance aging; Advanced directives discussed. No HCPOA/Living Will.  Additional information declined at this time.  Medicare Attestation I have personally reviewed: The patient's medical and social history Their use of  alcohol, tobacco or illicit drugs Their current medications and supplements The patient's functional ability including ADLs,fall risks, home safety risks, cognitive, and hearing and visual impairment Diet and physical activities Evidence for depression   The patient's weight, height, BMI, and visual acuity have been recorded in the chart.  I have made referrals and provided education to the patient based on review of the above and I have provided the patient with a written personalized care plan for preventive services.    During the course of the visit, Pamela Collier was educated and counseled about the following appropriate screening and preventive services:   Vaccines to include Pneumoccal, Influenza, Hepatitis B, Td, Zostavax, HCV  Electrocardiogram  Cardiovascular disease screening  Colorectal cancer screening  Bone density screening  Diabetes screening  Glaucoma screening  Mammography/PAP  Nutrition counseling  Smoking cessation counseling  Patient Instructions (the written plan) were given to the patient.    Varney Biles, LPN   06/01/9756

## 2016-07-01 NOTE — Progress Notes (Signed)
Pre visit review using our clinic review tool, if applicable. No additional management support is needed unless otherwise documented below in the visit note. 

## 2016-07-01 NOTE — Progress Notes (Signed)
Subjective:  Patient ID: Pamela Collier, female    DOB: [birth year]  Age: 76 y.o. MRN: [id number]  CC: The primary encounter diagnosis was Cervical radiculopathy at C7. Diagnoses of Acute midline thoracic back pain, Type 2 DM with CKD stage 3 and hypertension ([place]), Chest pain with low risk of acute coronary syndrome, and Osteoporosis, post-menopausal were also pertinent to this visit.  HPI Pamela Collier presents for ER FOLLOW UP . She was  seen in ER 2 days ago for sudden onset of sharp left sided chest pain that radiated to her back and abdomen that occurred at 8 am chest pain and bilateral leg numbness . The leg numbness involved both legs from the knees   MRA chest done to rule out aortic dissection , study was limited by motion artifact  But all visualized arteries were patent .    Was treated with IV esmolol and nicardipine  And dc'd home once pain improved.  Still having sharp left sided chest pains that come and go. Brought on both at rest  Also has left arm pain , not related to the chest pain.  Did not tell the ER doctor that she had injured her back on the left sided after having a fall to home 2-3 days after discharge.  thoracic spine films not done . Patient has kyphosis and osteoporosis with most recent DEXA 2014 t score -2.2   Home bps recorded and reviewed :  9/10 are below 120/70   3 month follow up on diabetes.   Patient is following a low glycemic index diet and taking all prescribed medications regularly without side effects.  Fasting sugars have been under less than 140 most of the time and post prandials have been under 160 except on rare occasions. Patient is exercising about 3 times per week and intentionally trying to lose weight .  Patient has had an eye exam in the last 12 months and checks feet regularly for signs of infection.  Patient does not walk barefoot outside,  And denies an numbness tingling or burning in feet. Patient is up to date on all recommended  vaccinations   Lab Results  Component Value Date   HGBA1C 7.1 06/18/2016      Outpatient Medications Prior to Visit  Medication Sig Dispense Refill  . acetaminophen (TYLENOL) 500 MG tablet Take 500 mg by mouth every 6 (six) hours as needed for mild pain or headache.    . albuterol (PROAIR HFA) 108 (90 Base) MCG/ACT inhaler Inhale 2 puffs into the lungs every 6 (six) hours as needed for wheezing or shortness of breath. 1 Inhaler 2  . albuterol (PROVENTIL) (2.5 MG/3ML) 0.083% nebulizer solution Take 3 mLs (2.5 mg total) by nebulization every 6 (six) hours as needed for wheezing or shortness of breath. 150 mL 1  . aspirin EC 81 MG tablet Take 81 mg by mouth daily.    . B-D UF III MINI PEN NEEDLES 31G X 5 MM MISC USE THREE TIMES A DAY 270 each 3  . benzonatate (TESSALON) 100 MG capsule Take 200 mg by mouth 3 (three) times daily as needed for cough. Reported on 11/09/2015    . BRILINTA 90 MG TABS tablet TAKE 1 TABLET TWICE A DAY 180 tablet 1  . budesonide-formoterol (SYMBICORT) 160-4.5 MCG/ACT inhaler Inhale 2 puffs into the lungs 2 (two) times daily. 1 Inhaler 0  . docusate sodium (COLACE) 100 MG capsule Take 100 mg by mouth 2 (two) times daily as needed  for mild constipation. Reported on 11/09/2015    . fluticasone (FLONASE) 50 MCG/ACT nasal spray Place 2 sprays into both nostrils daily as needed for rhinitis.    . furosemide (LASIX) 20 MG tablet Take 1 tablet (20 mg total) by mouth daily. 90 tablet 1  . glucose blood (ACCU-CHEK AVIVA PLUS) test strip USE TO CHECK GLUCOSE  THREE TIMES DAILY 300 each 3  . hydrOXYzine (ATARAX/VISTARIL) 10 MG tablet Take 1 tablet (10 mg total) by mouth 3 (three) times daily as needed. 30 tablet 0  . Insulin Lispro Prot & Lispro (HUMALOG MIX 75/25 KWIKPEN) (75-25) 100 UNIT/ML Kwikpen Inject 45 to 60 units twice daily before morning and evening meals 112 mL 3  . isosorbide mononitrate (IMDUR) 30 MG 24 hr tablet TAKE 1 TABLET DAILY 90 tablet 1  . losartan (COZAAR) 100  MG tablet TAKE 1 TABLET DAILY 90 tablet 0  . metoprolol succinate (TOPROL-XL) 25 MG 24 hr tablet Take 1 tablet (25 mg total) by mouth daily. 90 tablet 3  . naproxen (NAPROSYN) 500 MG tablet Take 1 tablet (500 mg total) by mouth 2 (two) times daily with a meal. 10 tablet 0  . nitroGLYCERIN (NITROSTAT) 0.4 MG SL tablet Place 1 tablet (0.4 mg total) under the tongue every 5 (five) minutes as needed for chest pain. 25 tablet 3  . ondansetron (ZOFRAN) 4 MG tablet Take 1 tablet (4 mg total) by mouth every 8 (eight) hours as needed for nausea or vomiting. 20 tablet 0  . PARoxetine (PAXIL) 20 MG tablet TAKE ONE TABLET BY MOUTH ONCE DAILY 90 tablet 1  . polyethylene glycol (MIRALAX / GLYCOLAX) packet Take 17 g by mouth daily as needed for mild constipation. Reported on 10/25/2015    . pregabalin (LYRICA) 100 MG capsule Take 1 capsule (100 mg total) by mouth 3 (three) times daily. 270 capsule 2  . rosuvastatin (CRESTOR) 20 MG tablet Take 20 mg by mouth at bedtime.    . traMADol (ULTRAM) 50 MG tablet Take 50 mg by mouth every 6 (six) hours as needed for moderate pain.    . Vitamin D, Ergocalciferol, (DRISDOL) 50000 units CAPS capsule Take 50,000 Units by mouth every 7 (seven) days. Pt takes on Sunday.    [name] alendronate (FOSAMAX) 70 MG tablet Take 70 mg by mouth once a week. Pt takes on Sunday.   Take with a full glass of water on an empty stomach.    . predniSONE (DELTASONE) 10 MG tablet Take 50 mg daily---taper by 10 mg daily then stop 15 tablet 0   Facility-Administered Medications Prior to Visit  Medication Dose Route Frequency Provider Last Rate Last Dose  . albuterol (PROVENTIL) (5 MG/ML) 0.5% nebulizer solution 2.5 mg  2.5 mg Nebulization Once [name], MD        Review of Systems;  Patient denies headache, fevers, malaise, unintentional weight loss, skin rash, eye pain, sinus congestion and sinus pain, sore throat, dysphagia,  hemoptysis , cough, dyspnea, wheezing,  palpitations, orthopnea,  edema, abdominal pain, nausea, melena, diarrhea, constipation, flank pain, dysuria, hematuria, urinary  Frequency, nocturia, numbness, tingling, seizures,  Focal weakness, Loss of consciousness,  Tremor, insomnia, depression, anxiety, and suicidal ideation.      Objective:  BP 132/70   Pulse 67   Resp 16   Wt 187 lb (84.8 kg)   SpO2 96%   BMI 29.29 kg/m   BP Readings from Last 3 Encounters:  07/01/16 132/70  07/01/16 132/70  06/30/16 138/70  Wt Readings from Last 3 Encounters:  07/01/16 187 lb (84.8 kg)  07/01/16 187 lb (84.8 kg)  06/30/16 188 lb (85.3 kg)    General appearance: alert, cooperative and appears stated age Ears: normal TM's and external ear canals both ears Throat: lips, mucosa, and tongue normal; teeth and gums normal Neck: no adenopathy, no carotid bruit, supple, symmetrical, trachea midline and thyroid not enlarged, symmetric, no tenderness/mass/nodules Back: symmetric, no curvature. ROM normal. No CVA tenderness. Lungs: clear to auscultation bilaterally Heart: regular rate and rhythm, S1, S2 normal, no murmur, click, rub or gallop Abdomen: soft, non-tender; bowel sounds normal; no masses,  no organomegaly Pulses: 2+ and symmetric Skin: Skin color, texture, turgor normal. No rashes or lesions Lymph nodes: Cervical, supraclavicular, and axillary nodes normal.  Lab Results  Component Value Date   HGBA1C 7.1 06/18/2016   HGBA1C 7.6 (H) 03/14/2016   HGBA1C 7.7 12/13/2015    Lab Results  Component Value Date   CREATININE 1.05 (H) 06/29/2016   CREATININE 1.11 (H) 06/11/2016   CREATININE 1.25 (H) 05/17/2016    Lab Results  Component Value Date   WBC 7.1 06/29/2016   HGB 11.1 (L) 06/29/2016   HCT 33.1 (L) 06/29/2016   PLT 297 06/29/2016   GLUCOSE 155 (H) 06/29/2016   CHOL 145 06/18/2016   TRIG 164.0 (H) 06/18/2016   HDL 47.00 06/18/2016   LDLDIRECT 102.0 03/14/2016   LDLCALC 66 06/18/2016   ALT 14 06/29/2016   AST 18 06/29/2016   NA 139  06/29/2016   K 4.2 06/29/2016   CL 108 06/29/2016   CREATININE 1.05 (H) 06/29/2016   BUN 14 06/29/2016   CO2 25 06/29/2016   TSH 1.16 07/01/2013   INR 1.2 (H) 02/02/2015   HGBA1C 7.1 06/18/2016   MICROALBUR 28.9 (H) 09/26/2015    Dg Chest 2 View  Result Date: 06/29/2016 CLINICAL DATA:  Left chest and low back pain with bilateral leg numbness today. Shortness of breath. History of congestive heart failure, COPD and diabetes. EXAM: CHEST  2 VIEW COMPARISON:  06/11/2016 and 05/18/2016 radiographs. FINDINGS: There is stable mild cardiomegaly. There is chronic interstitial prominence with central airway and fissural thickening. No overt pulmonary edema, confluent airspace opacity or significant pleural effusion is identified. Degenerative changes are present throughout the spine. There are old rib fractures on the left. IMPRESSION: Chronic interstitial prominence, similar to prior studies, and mild cardiomegaly. No acute findings demonstrated. Electronically Signed   By: Richardean Sale M.D.   On: 06/29/2016 11:24   Mr Jodene Nam Chest W Wo Contrast  Result Date: 06/29/2016 CLINICAL DATA:  Chest pain EXAM: MRA CHEST WITH CONTRAST TECHNIQUE: Multiplanar, multiecho pulse sequences of the chest were obtained with intravenous contrast. Angiographic images of abdomen and pelvis were obtained using MRA technique with intravenous contrast. CONTRAST:  55mL MULTIHANCE GADOBENATE DIMEGLUMINE 529 MG/ML IV SOLN COMPARISON:  None. FINDINGS: There is severe motion artifact severely limiting this exam. Aortic dissection cannot be excluded by this examination. There is no obvious intramural hematoma in the thoracic aorta. The ascending aorta is non aneurysmal. There is no obvious dissection flap. There is artifact along the aortic arch and descending aorta due to motion and a small dissection cannot be entirely excluded. Innominate artery, proximal right subclavian artery, right common carotid artery are grossly patent. Left  common carotid artery is poorly visualized but is probably patent. Left subclavian artery is also very poorly visualized. There is mild narrowing at the origin of the celiac axis. SMA is grossly  patent. Bilateral renal arteries are grossly patent. Small right pleural effusion is present. Tiny left pleural effusion. Visualized liver and spleen are grossly within normal limits. IMPRESSION: Very limited exam secondary to motion artifact. Although there is no obvious intramural hematoma or aortic dissection, small aortic dissection cannot be excluded by this examination. Small right pleural effusion.  Tiny left pleural effusion. Electronically Signed   By: Marybelle Killings M.D.   On: 06/29/2016 13:30    Assessment & Plan:   Problem List Items Addressed This Visit    Acute midline thoracic back pain    No evidence of vertebral fracture on today's films       Relevant Orders   DG Thoracic Spine W/Swimmers (Completed)   Cervical radiculopathy at C7 - Primary   Relevant Orders   DG Cervical Spine Complete (Completed)   Chest pain with low risk of acute coronary syndrome    I suspect it is referred pain from cervical and thoracic spine disease. MRI cervical spine  Recommended.       Osteoporosis, post-menopausal    Advised to  resume alendronate      Relevant Medications   alendronate (FOSAMAX) 70 MG tablet   Type 2 DM with CKD stage 3 and hypertension ([facility])    H well-controlled on current medications.  hemoglobin A1c has been consistently at or  less than 7.0 . Patient is up-to-date on eye exams and foot exam is normal today. Patient is due for urine microalbumin to creatinine ratio at next visit. Patient is tolerating statin therapy for CAD risk reduction and on ACE/ARB for reduction in proteinuria.  Lab Results  Component Value Date   HGBA1C 7.1 06/18/2016   Lab Results  Component Value Date   MICROALBUR 28.9 (H) 09/26/2015             I have changed Ms. Donn alendronate. I am  also having her maintain her budesonide-formoterol, nitroGLYCERIN, acetaminophen, benzonatate, docusate sodium, Vitamin D (Ergocalciferol), fluticasone, rosuvastatin, polyethylene glycol, traMADol, aspirin EC, Insulin Lispro Prot & Lispro, hydrOXYzine, isosorbide mononitrate, B-D UF III MINI PEN NEEDLES, albuterol, glucose blood, pregabalin, albuterol, ondansetron, furosemide, losartan, metoprolol succinate, PARoxetine, BRILINTA, and naproxen.  Meds ordered this encounter  Medications  . alendronate (FOSAMAX) 70 MG tablet    Sig: Take 1 tablet (70 mg total) by mouth once a week. Pt takes on Sunday.   Take with a full glass of water on an empty stomach.    Dispense:  4 tablet    Refill:  11    Medications Discontinued During This Encounter  Medication Reason  . alendronate (FOSAMAX) 70 MG tablet Reorder    Follow-up: No Follow-up on file.   [name], MD

## 2016-07-01 NOTE — Patient Instructions (Addendum)
Pamela Collier , Thank you for taking time to come for your Medicare Wellness Visit. I appreciate your ongoing commitment to your health goals. Please review the following plan we discussed and let me know if I can assist you in the future.   Follow up with Dr. Derrel Nip as needed.  These are the goals we discussed: Goals    . Increase physical activity          Physical therapy as directed.       This is a list of the screening recommended for you and due dates:  Health Maintenance  Topic Date Due  . Tetanus Vaccine  05/01/1960  . Eye exam for diabetics  08/01/2016  . Complete foot exam   09/25/2016  . Hemoglobin A1C  12/16/2016  . Flu Shot  Completed  . DEXA scan (bone density measurement)  Completed  . Pneumonia vaccines  Completed    Fall Prevention in the Home Introduction Falls can cause injuries. They can happen to people of all ages. There are many things you can do to make your home safe and to help prevent falls. What can I do on the outside of my home?  Regularly fix the edges of walkways and driveways and fix any cracks.  Remove anything that might make you trip as you walk through a door, such as a raised step or threshold.  Trim any bushes or trees on the path to your home.  Use bright outdoor lighting.  Clear any walking paths of anything that might make someone trip, such as rocks or tools.  Regularly check to see if handrails are loose or broken. Make sure that both sides of any steps have handrails.  Any raised decks and porches should have guardrails on the edges.  Have any leaves, snow, or ice cleared regularly.  Use sand or salt on walking paths during winter.  Clean up any spills in your garage right away. This includes oil or grease spills. What can I do in the bathroom?  Use night lights.  Install grab bars by the toilet and in the tub and shower. Do not use towel bars as grab bars.  Use non-skid mats or decals in the tub or shower.  If you  need to sit down in the shower, use a plastic, non-slip stool.  Keep the floor dry. Clean up any water that spills on the floor as soon as it happens.  Remove soap buildup in the tub or shower regularly.  Attach bath mats securely with double-sided non-slip rug tape.  Do not have throw rugs and other things on the floor that can make you trip. What can I do in the bedroom?  Use night lights.  Make sure that you have a light by your bed that is easy to reach.  Do not use any sheets or blankets that are too big for your bed. They should not hang down onto the floor.  Have a firm chair that has side arms. You can use this for support while you get dressed.  Do not have throw rugs and other things on the floor that can make you trip. What can I do in the kitchen?  Clean up any spills right away.  Avoid walking on wet floors.  Keep items that you use a lot in easy-to-reach places.  If you need to reach something above you, use a strong step stool that has a grab bar.  Keep electrical cords out of the way.  Do  not use floor polish or wax that makes floors slippery. If you must use wax, use non-skid floor wax.  Do not have throw rugs and other things on the floor that can make you trip. What can I do with my stairs?  Do not leave any items on the stairs.  Make sure that there are handrails on both sides of the stairs and use them. Fix handrails that are broken or loose. Make sure that handrails are as long as the stairways.  Check any carpeting to make sure that it is firmly attached to the stairs. Fix any carpet that is loose or worn.  Avoid having throw rugs at the top or bottom of the stairs. If you do have throw rugs, attach them to the floor with carpet tape.  Make sure that you have a light switch at the top of the stairs and the bottom of the stairs. If you do not have them, ask someone to add them for you. What else can I do to help prevent falls?  Wear shoes  that:  Do not have high heels.  Have rubber bottoms.  Are comfortable and fit you well.  Are closed at the toe. Do not wear sandals.  If you use a stepladder:  Make sure that it is fully opened. Do not climb a closed stepladder.  Make sure that both sides of the stepladder are locked into place.  Ask someone to hold it for you, if possible.  Clearly mark and make sure that you can see:  Any grab bars or handrails.  First and last steps.  Where the edge of each step is.  Use tools that help you move around (mobility aids) if they are needed. These include:  Canes.  Walkers.  Scooters.  Crutches.  Turn on the lights when you go into a dark area. Replace any light bulbs as soon as they burn out.  Set up your furniture so you have a clear path. Avoid moving your furniture around.  If any of your floors are uneven, fix them.  If there are any pets around you, be aware of where they are.  Review your medicines with your doctor. Some medicines can make you feel dizzy. This can increase your chance of falling. Ask your doctor what other things that you can do to help prevent falls. This information is not intended to replace advice given to you by your health care provider. Make sure you discuss any questions you have with your health care provider. Document Released: 02/22/2009 Document Revised: 10/04/2015 Document Reviewed: 06/02/2014  2017 [employer]

## 2016-07-01 NOTE — Patient Instructions (Addendum)
Your chest wall and left arm pain might be "referred" pain from a fractured vertebra I your spine  I am x raying your neck and upper back today to make sure you do not have a fracture  I want you to resume the osteoporosis medicine alendronate once a week .

## 2016-07-02 ENCOUNTER — Telehealth: Payer: Self-pay | Admitting: *Deleted

## 2016-07-02 DIAGNOSIS — J449 Chronic obstructive pulmonary disease, unspecified: Secondary | ICD-10-CM | POA: Diagnosis not present

## 2016-07-02 DIAGNOSIS — M5412 Radiculopathy, cervical region: Secondary | ICD-10-CM | POA: Insufficient documentation

## 2016-07-02 DIAGNOSIS — M546 Pain in thoracic spine: Secondary | ICD-10-CM | POA: Insufficient documentation

## 2016-07-02 DIAGNOSIS — J189 Pneumonia, unspecified organism: Secondary | ICD-10-CM | POA: Diagnosis not present

## 2016-07-02 NOTE — Telephone Encounter (Signed)
Physical therapy has requested verbal orders for continued physical therapy to streinthing patient, following a fall. He has requested 1 time a week for 1 week, 2 times a week for 2 weeks and 1 time a week for one week. [employer] [phone]

## 2016-07-02 NOTE — Telephone Encounter (Signed)
Ok to give verbal for PT continuation

## 2016-07-02 NOTE — Assessment & Plan Note (Signed)
No evidence of vertebral fracture on today's films

## 2016-07-02 NOTE — Assessment & Plan Note (Signed)
I suspect it is referred pain from cervical and thoracic spine disease. MRI cervical spine  Recommended.

## 2016-07-02 NOTE — Telephone Encounter (Signed)
Gave verbal orders to [place] as stated below

## 2016-07-02 NOTE — Assessment & Plan Note (Signed)
Advised to  resume alendronate

## 2016-07-02 NOTE — Assessment & Plan Note (Signed)
H well-controlled on current medications.  hemoglobin A1c has been consistently at or  less than 7.0 . Patient is up-to-date on eye exams and foot exam is normal today. Patient is due for urine microalbumin to creatinine ratio at next visit. Patient is tolerating statin therapy for CAD risk reduction and on ACE/ARB for reduction in proteinuria.  Lab Results  Component Value Date   HGBA1C 7.1 06/18/2016   Lab Results  Component Value Date   MICROALBUR 28.9 (H) 09/26/2015

## 2016-07-03 DIAGNOSIS — R262 Difficulty in walking, not elsewhere classified: Secondary | ICD-10-CM | POA: Diagnosis not present

## 2016-07-03 DIAGNOSIS — M6281 Muscle weakness (generalized): Secondary | ICD-10-CM

## 2016-07-03 DIAGNOSIS — Z7689 Persons encountering health services in other specified circumstances: Secondary | ICD-10-CM | POA: Diagnosis not present

## 2016-07-06 NOTE — Progress Notes (Signed)
  I have reviewed the above information and agree with above.   Tedric Leeth, MD 

## 2016-07-08 DIAGNOSIS — I129 Hypertensive chronic kidney disease with stage 1 through stage 4 chronic kidney disease, or unspecified chronic kidney disease: Secondary | ICD-10-CM | POA: Diagnosis not present

## 2016-07-08 DIAGNOSIS — R809 Proteinuria, unspecified: Secondary | ICD-10-CM | POA: Diagnosis not present

## 2016-07-08 DIAGNOSIS — E1122 Type 2 diabetes mellitus with diabetic chronic kidney disease: Secondary | ICD-10-CM | POA: Diagnosis not present

## 2016-07-08 DIAGNOSIS — N183 Chronic kidney disease, stage 3 (moderate): Secondary | ICD-10-CM | POA: Diagnosis not present

## 2016-07-09 DIAGNOSIS — J189 Pneumonia, unspecified organism: Secondary | ICD-10-CM | POA: Diagnosis not present

## 2016-07-09 DIAGNOSIS — J449 Chronic obstructive pulmonary disease, unspecified: Secondary | ICD-10-CM | POA: Diagnosis not present

## 2016-07-10 DIAGNOSIS — J449 Chronic obstructive pulmonary disease, unspecified: Secondary | ICD-10-CM | POA: Diagnosis not present

## 2016-07-10 DIAGNOSIS — J189 Pneumonia, unspecified organism: Secondary | ICD-10-CM | POA: Diagnosis not present

## 2016-07-11 DIAGNOSIS — J189 Pneumonia, unspecified organism: Secondary | ICD-10-CM | POA: Diagnosis not present

## 2016-07-11 DIAGNOSIS — J449 Chronic obstructive pulmonary disease, unspecified: Secondary | ICD-10-CM | POA: Diagnosis not present

## 2016-07-14 ENCOUNTER — Telehealth: Payer: Self-pay | Admitting: *Deleted

## 2016-07-14 NOTE — Telephone Encounter (Signed)
Pt requested a call on a update to having a scheduled MRI Pt contact [phone]

## 2016-07-15 ENCOUNTER — Telehealth: Payer: Self-pay | Admitting: *Deleted

## 2016-07-15 ENCOUNTER — Other Ambulatory Visit: Payer: Self-pay | Admitting: Internal Medicine

## 2016-07-15 DIAGNOSIS — J449 Chronic obstructive pulmonary disease, unspecified: Secondary | ICD-10-CM | POA: Diagnosis not present

## 2016-07-15 DIAGNOSIS — J189 Pneumonia, unspecified organism: Secondary | ICD-10-CM | POA: Diagnosis not present

## 2016-07-15 NOTE — Telephone Encounter (Signed)
[name] called to inform us that she will not be started [facility] until after her appointment with Dr Vickki Muff. Her right toe ulcer may have to be xrayed.    By Karie Fetch

## 2016-07-15 NOTE — Telephone Encounter (Signed)
Called Pamela Collier. She had an xray of her right toe and had no infection. Her follow-up appointment with the physician is on 04/08/16,and she will check on clearance for [facility].    By Karie Fetch

## 2016-07-15 NOTE — Telephone Encounter (Signed)
Pamela Collier called and will miss [facility] today. Her big toe is swollen, and she has an appointment with Dr Derrel Nip today.    By Karie Fetch

## 2016-07-15 NOTE — Telephone Encounter (Signed)
Pamela Collier from [facility] stated that today was the last nursing visit, no other visits are needed [place] [phone]

## 2016-07-16 ENCOUNTER — Other Ambulatory Visit: Payer: Self-pay | Admitting: Internal Medicine

## 2016-07-16 DIAGNOSIS — M5412 Radiculopathy, cervical region: Secondary | ICD-10-CM

## 2016-07-16 DIAGNOSIS — J189 Pneumonia, unspecified organism: Secondary | ICD-10-CM | POA: Diagnosis not present

## 2016-07-16 DIAGNOSIS — J449 Chronic obstructive pulmonary disease, unspecified: Secondary | ICD-10-CM | POA: Diagnosis not present

## 2016-07-16 NOTE — Telephone Encounter (Signed)
MRI ordered.  Office will call with appointment.  Find out if she gets anxious during MRI procedures ,  I will prescribe somehting for her nerves if she does.

## 2016-07-16 NOTE — Telephone Encounter (Signed)
I called pt- she states she does not need any pre medications for the MRI.

## 2016-07-16 NOTE — Telephone Encounter (Signed)
Notes Recorded by Johna Sheriff, CMA on 07/04/2016 at 10:36 AM EST Patient advised of below and verbalized an understanding . Pamela Collier is agreeable to scheduling MRI of Cervical Spine. ------  Notes Recorded by [name], MD on 07/02/2016 at 12:05 PM EST Her plain x rays did not shopw any vertebral fractures, But her cervical spine (upper, includes the neck) has a lot of degenerative changes including bulging disks and bone spurs, which may be causing her symptoms of chest wall , Left arm pain and loss of muscle in the hands. . I am recommending that Pamela Collier have an MRI of the cervical spine to make sure the spinal cord and nerve roots aren't being pinched. Would Pamela Collier like to proceed

## 2016-07-17 MED ORDER — DIAZEPAM 5 MG PO TABS: ORAL_TABLET | ORAL | 0 refills | Status: DC

## 2016-07-17 MED ORDER — DIAZEPAM 10 MG PO TABS: 10.0000 mg | ORAL_TABLET | Freq: Three times a day (TID) | ORAL | 0 refills | Status: DC | PRN

## 2016-07-17 NOTE — Telephone Encounter (Signed)
Pt's MRI has been scheduled for March 22 at [facility]. She states that she thinks she is a little claustrophobic and is nervous about getting this done because she is so jittery now. She would like to know if Dr. Derrel Nip can possibly send her in something to help calm her nerves.  I offered to send her to [facility] since they have an open MRI but she declined because they are not very familiar with [facility] and not comfortable driving there.

## 2016-07-17 NOTE — Telephone Encounter (Signed)
Yes I can call I valium 5 mg to take 1 tablet one  Hour before the MRI . She can repeat the dose if still nervous when she gets there.   Erasmo Downer or Erline Levine can you print out for signing?

## 2016-07-18 DIAGNOSIS — J189 Pneumonia, unspecified organism: Secondary | ICD-10-CM | POA: Diagnosis not present

## 2016-07-18 DIAGNOSIS — J449 Chronic obstructive pulmonary disease, unspecified: Secondary | ICD-10-CM | POA: Diagnosis not present

## 2016-07-18 MED ORDER — DIAZEPAM 5 MG PO TABS: ORAL_TABLET | ORAL | 0 refills | Status: DC

## 2016-07-18 NOTE — Telephone Encounter (Signed)
Script called to pharmacy and patient notified of instruction per SIG:

## 2016-07-20 IMAGING — CR DG CHEST 2V
1 series · 2 of 2 positions shown · non-contrast
Comparison: CT chest 01/24/2014.  Chest 12/20/2013.

CLINICAL DATA: Shortness of breath. Left-sided chest pain.
Epigastric pain. Low back pain.

EXAM:
CHEST  2 VIEW

[Series 1: dxr chest pa (or ap) and lateral · 0.14mm/px · 2 of 2 slices shown]
[im 1/2]
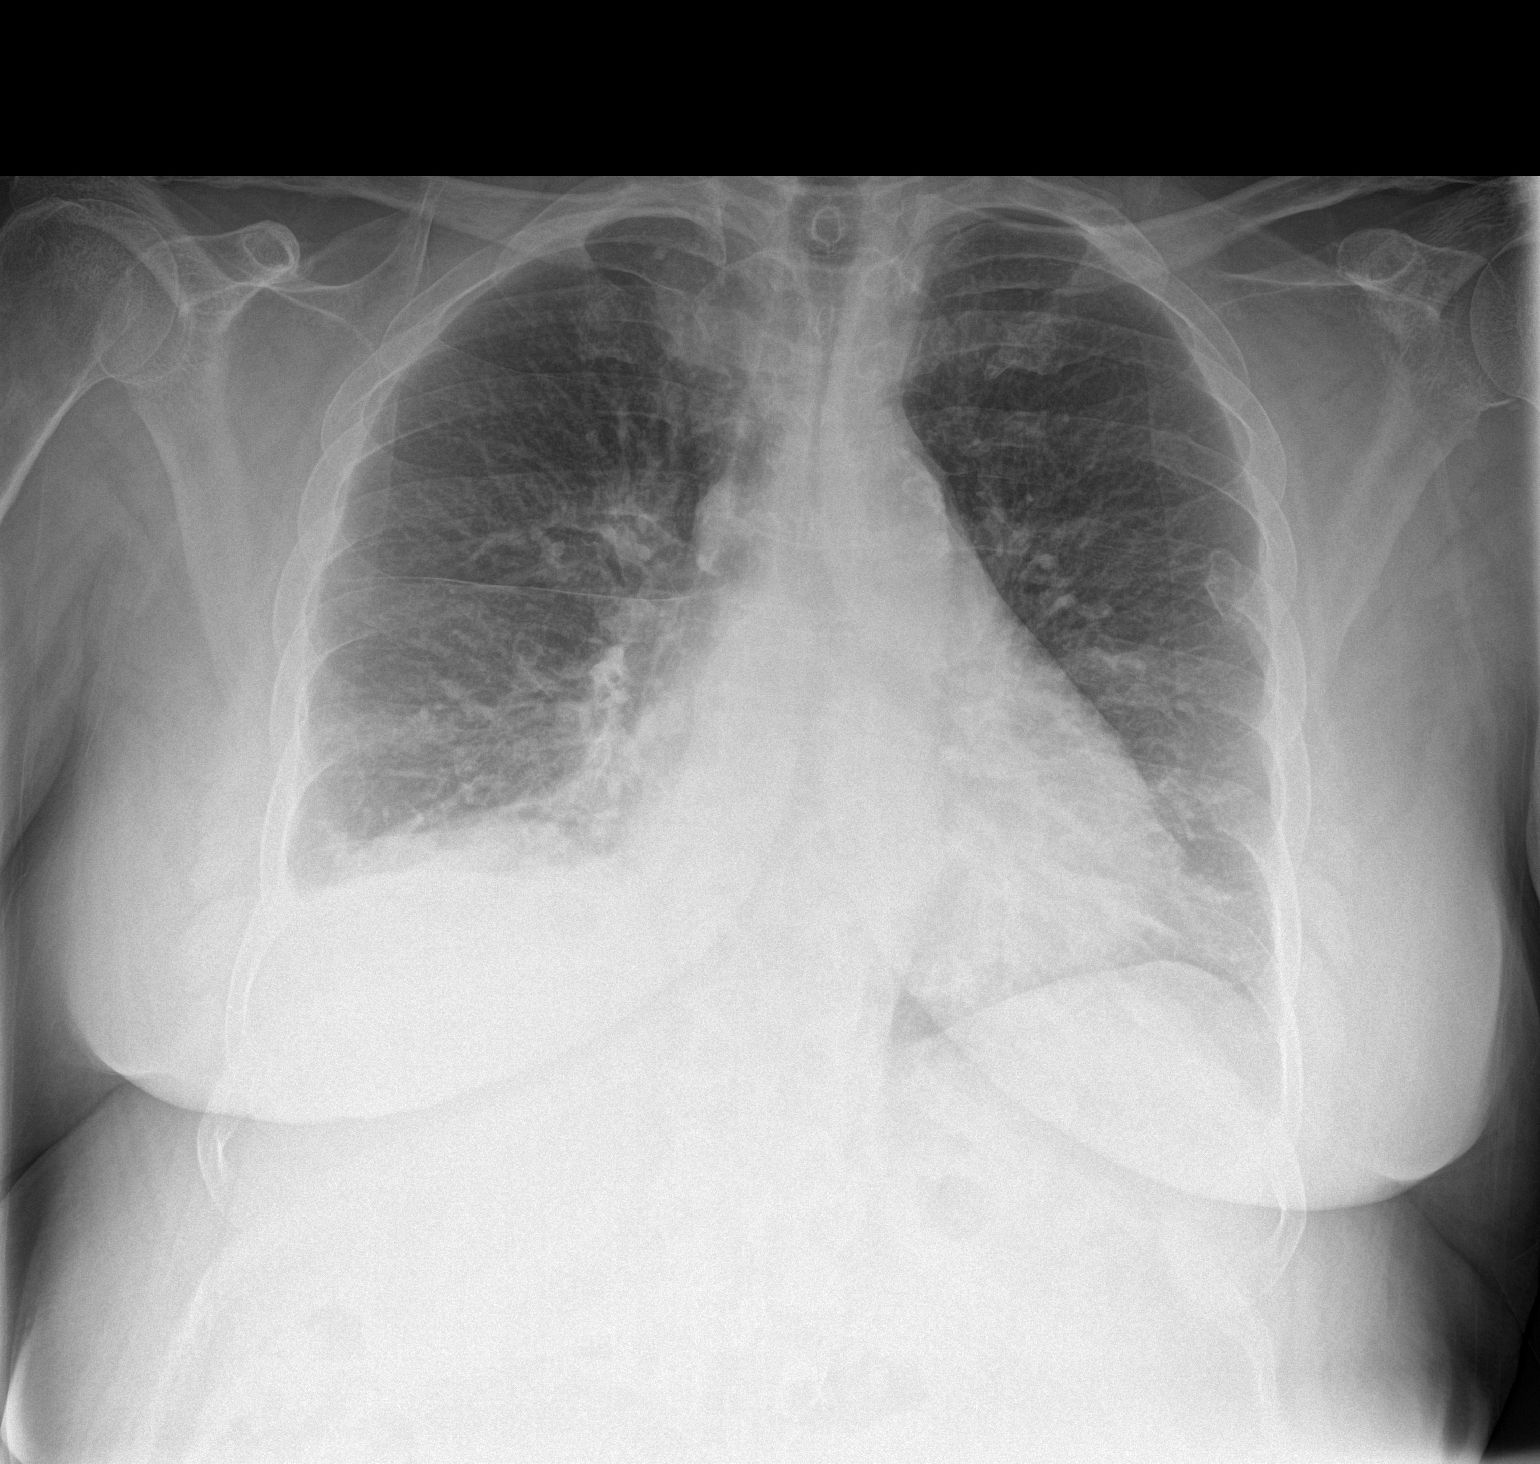
[im 2/2]
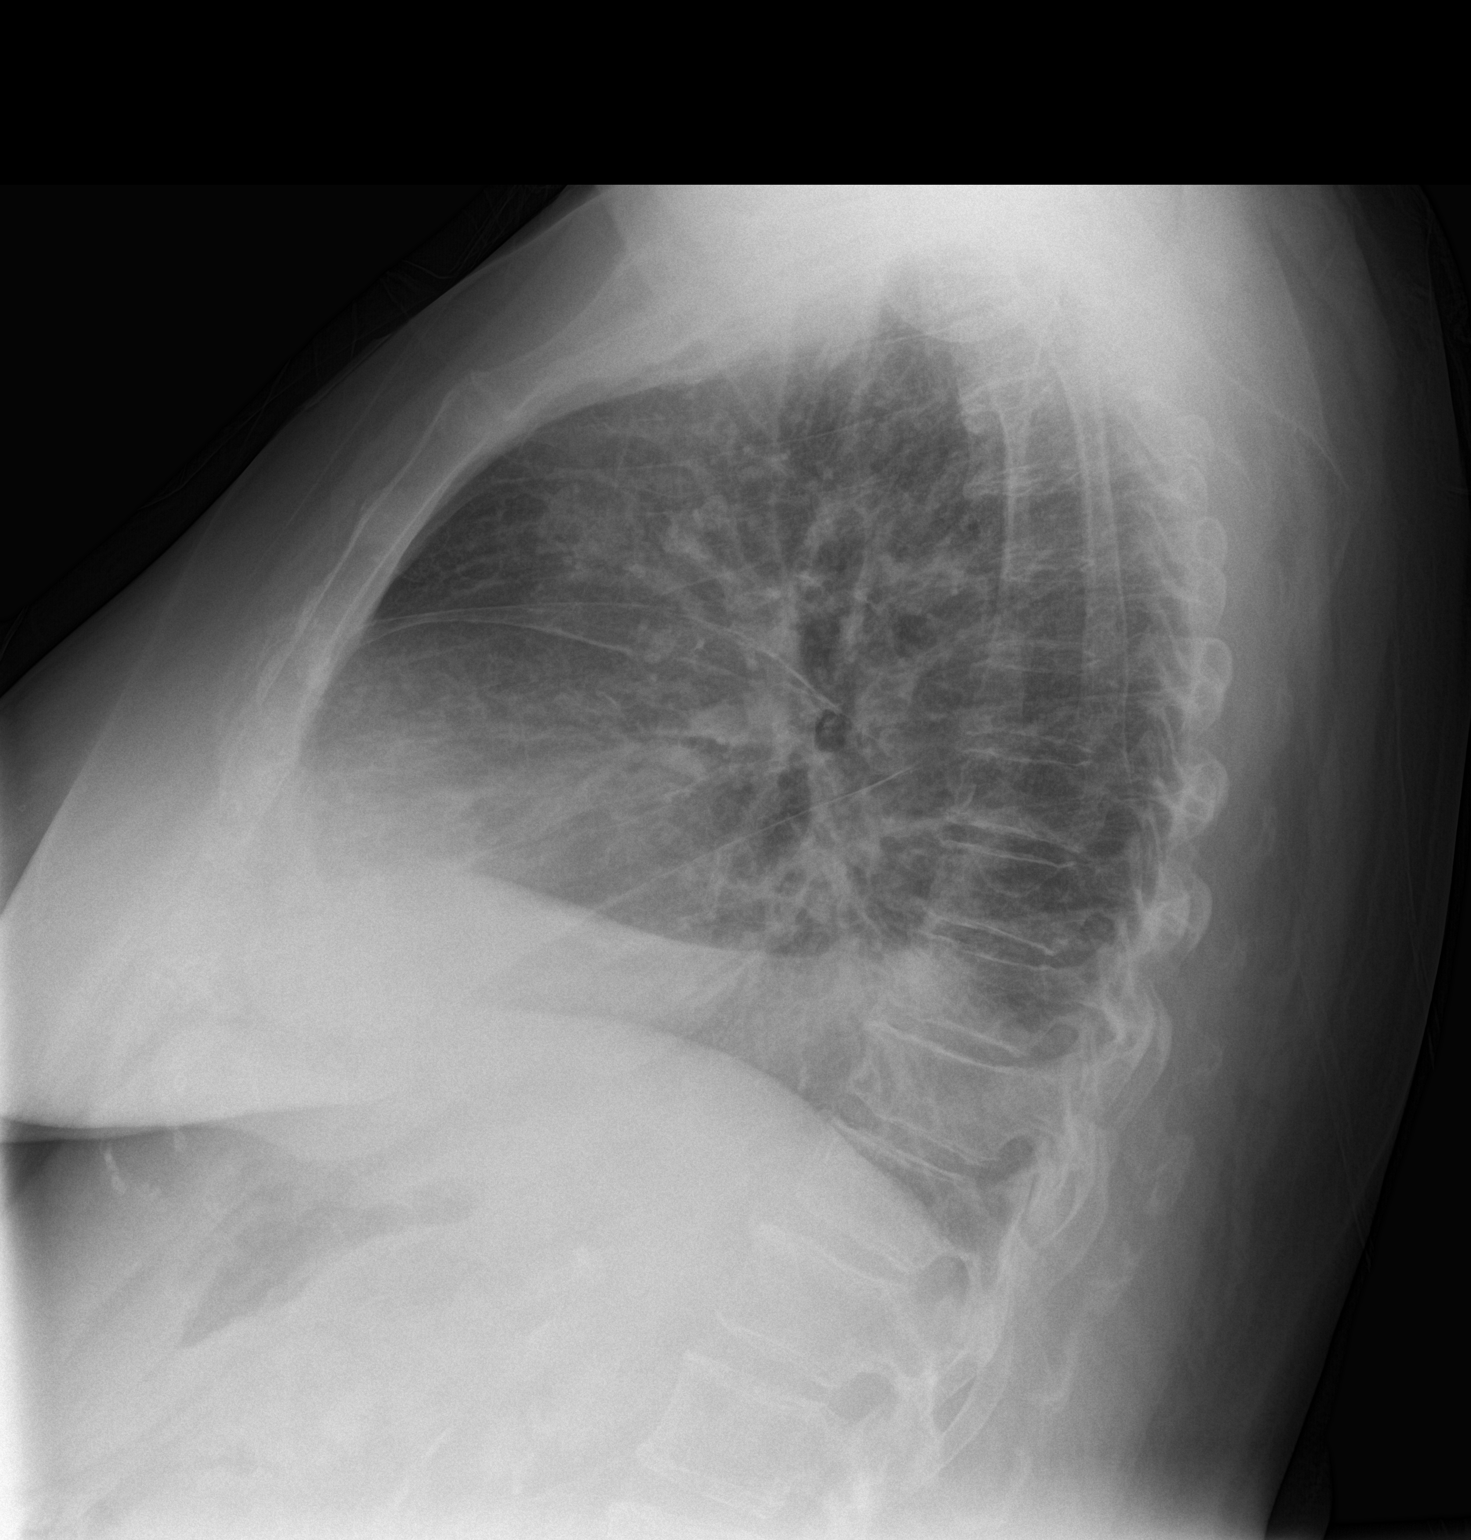

[2 of 2 positions shown; findings below may reference images not displayed]

FINDINGS: Small persistent right pleural effusion with basilar infiltration or
atelectasis. This is slightly increased since previous study and may
represent residual or recurrent pneumonia. Normal heart size and
pulmonary vascularity. Old left rib fracture. Degenerative changes
in the spine.
IMPRESSION: Small right pleural effusion with slightly increased right basilar
infiltration since previous study. Changes may represent residual or
recurrent pneumonia.

## 2016-07-20 IMAGING — CT CT ABD-PELV W/O CM
2 of 3 series · 15 of 42 positions shown, 17 images · non-contrast
Comparison: Prior study from 05/26/2011.

CLINICAL DATA: Additional evaluation for left flank pain and left
lower quadrant abdominal pain. History of kidney steroids.

EXAM:
CT ABDOMEN AND PELVIS WITHOUT CONTRAST
TECHNIQUE: Multidetector CT imaging of the abdomen and pelvis was performed
following the standard protocol without IV contrast.

[Series 2: stone standard full · axial · 0.77mm/px · z∈[-481,-21]mm · 12 of 106 slices shown, 14 images]
[im 9/106  soft-tissue]
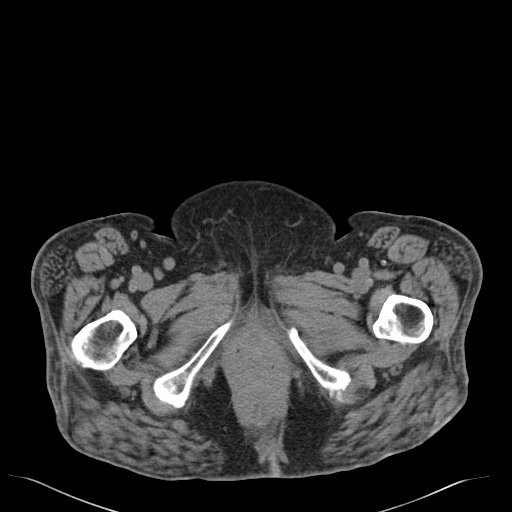
[im 9/106  bone]
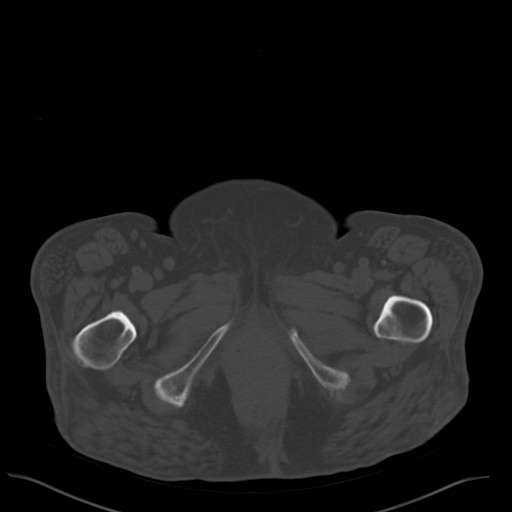
[im 17/106  soft-tissue]
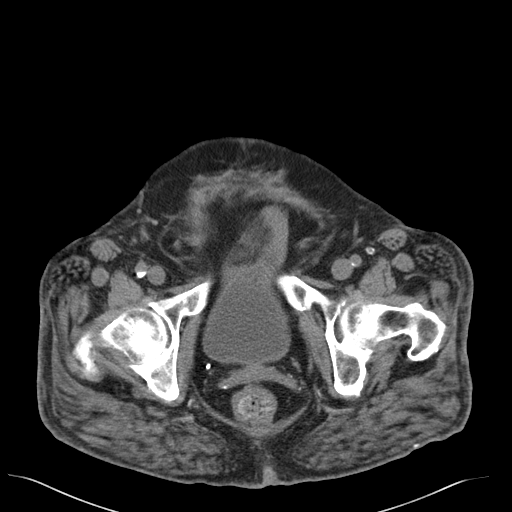
[im 26/106  soft-tissue]
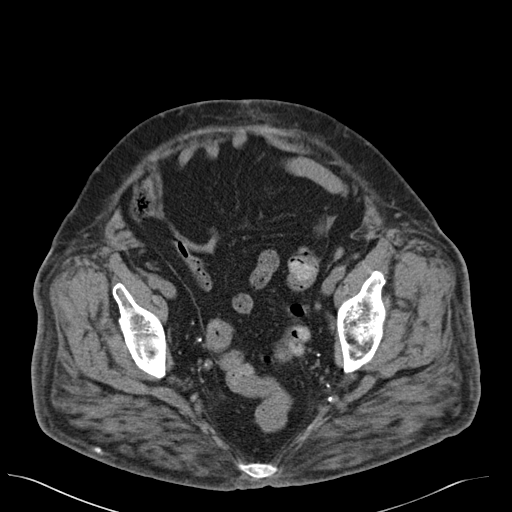
[im 34/106  soft-tissue]
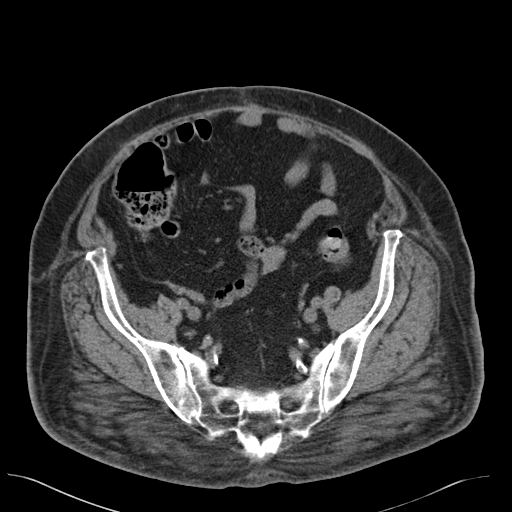
[im 43/106  soft-tissue]
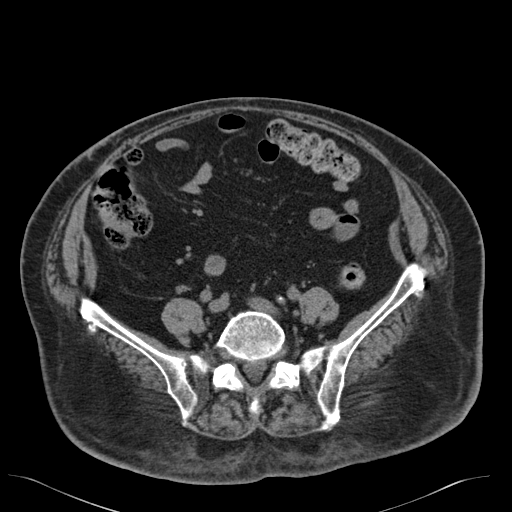
[im 51/106  soft-tissue]
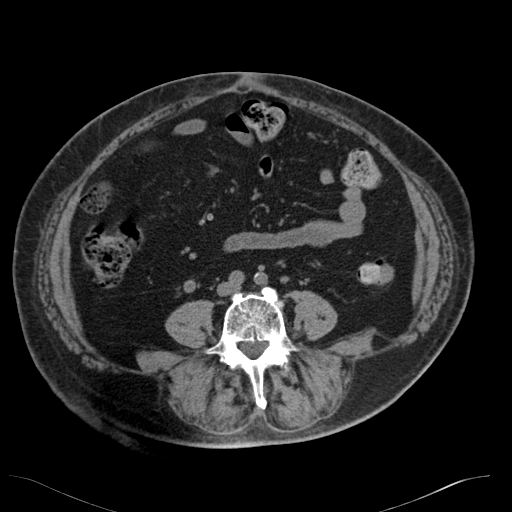
[im 59/106  soft-tissue]
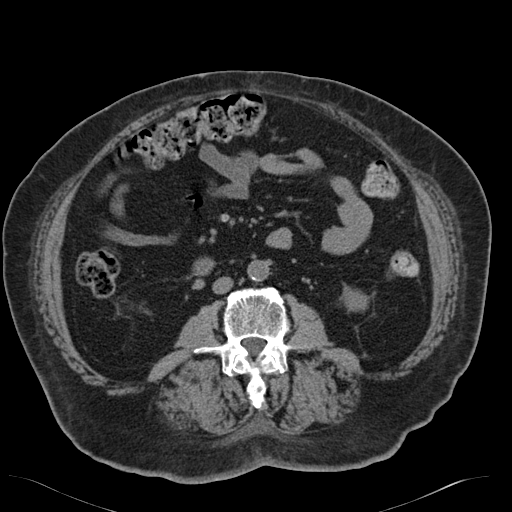
[im 68/106  soft-tissue]
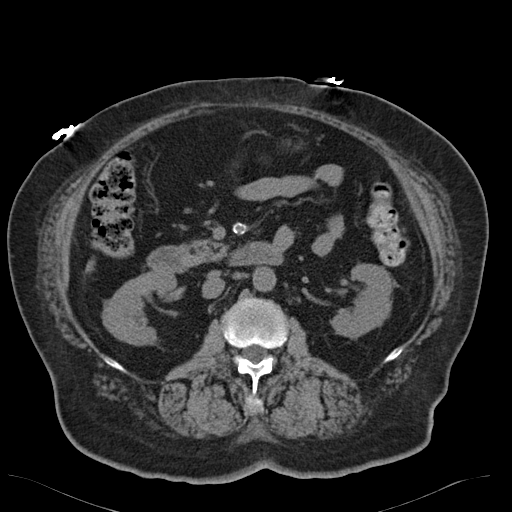
[im 76/106  soft-tissue]
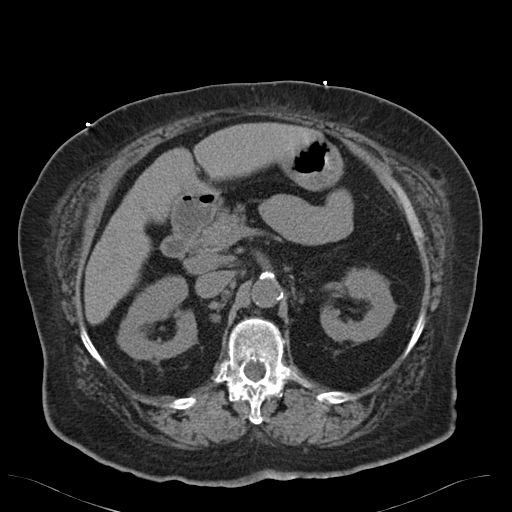
[im 76/106  bone]
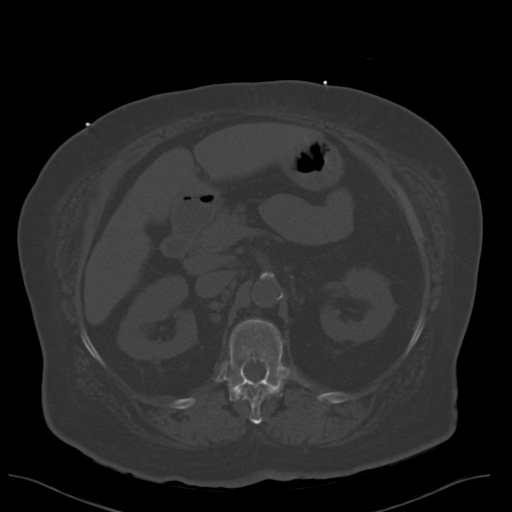
[im 85/106  soft-tissue]
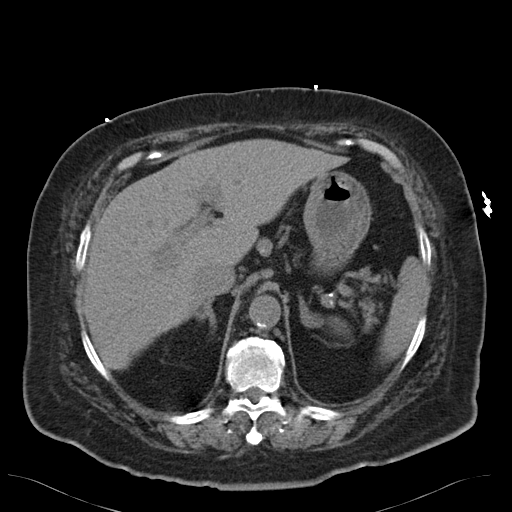
[im 93/106  soft-tissue]
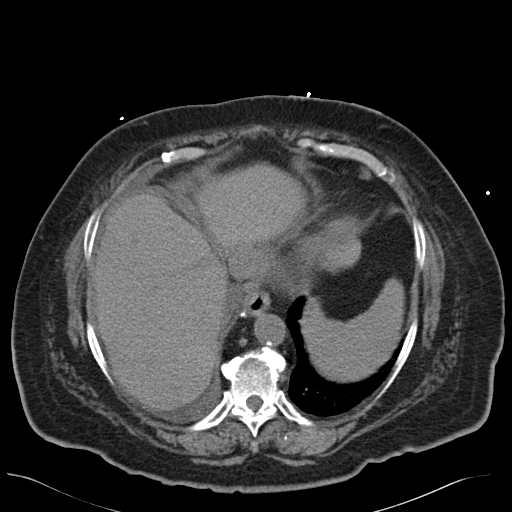
[im 101/106  soft-tissue]
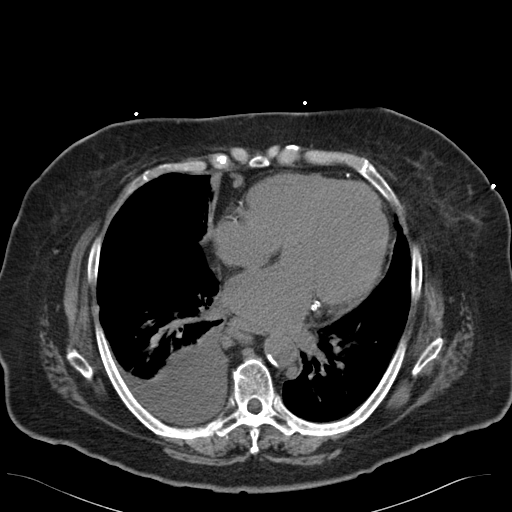

[Series 5: cor stone standard full · coronal · 0.76mm/px · 3 of 157 slices shown]
[im 53/157  soft-tissue]
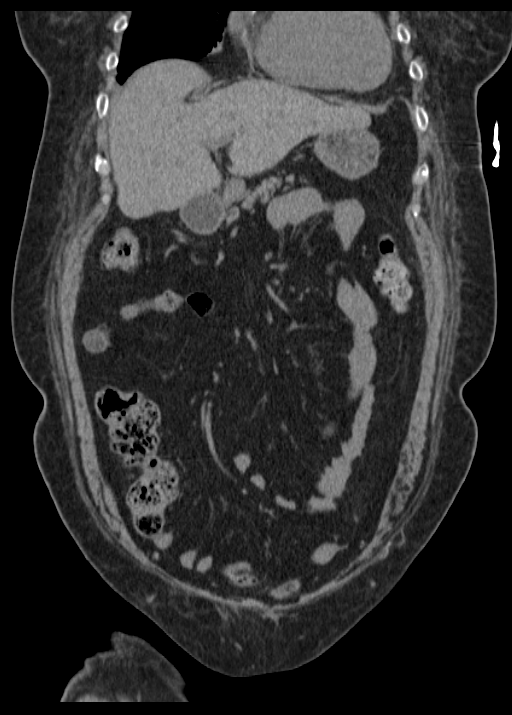
[im 70/157  soft-tissue]
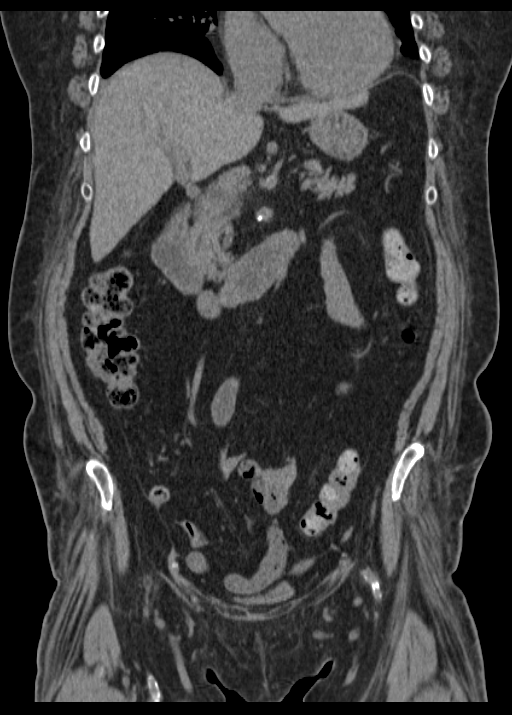
[im 87/157  soft-tissue]
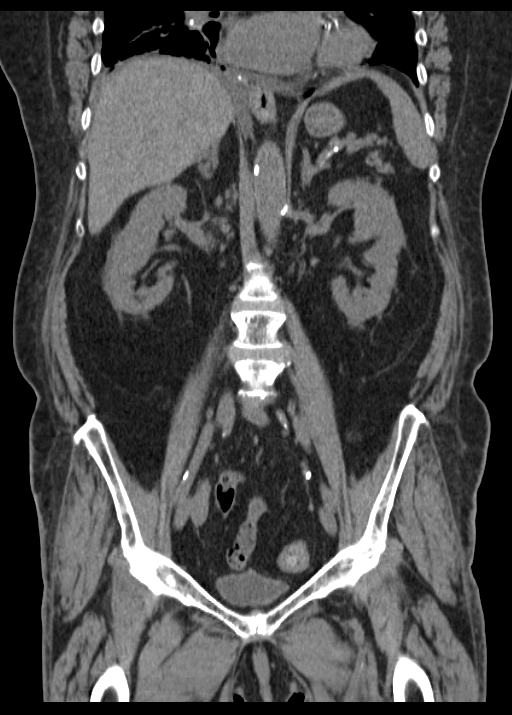

[15 of 42 positions shown; findings below may reference images not displayed]

FINDINGS: A moderate right pleural effusion is partially visualized. Bibasilar
atelectasis seen dependently within the visualized lung bases.

Limited noncontrast evaluation of the liver is unremarkable.
Gallbladder is absent. No biliary dilatation. Spleen, adrenal
glands, and pancreas are within normal limits.

Kidneys are equal in size without evidence for nephrolithiasis or
hydronephrosis. No stones seen along the course of either renal
collecting system. There is no hydroureter.

Small hiatal hernia present. No evidence for bowel obstruction. No
abnormal wall thickening or inflammatory changes seen about the
bowels. No evidence for acute appendicitis.

Partially distended bladder grossly within normal limits. Uterus is
absent. Ovaries within normal limits.

No free air or fluid. Prominent nodes again seen adjacent to the
distal esophagus measuring up to 1.1 cm in short axis. These are
similar relative to prior CT from 05/26/2011. Additional mildly
prominent 9 mm pericaval node is also similar. Prominent 1 cm right
cardiophrenic node is unchanged. No new pathologically enlarged
intra-abdominal pelvic lymph nodes.

Tiny fat containing paraumbilical hernia noted.

Moderate atheromatous disease present within the intra-abdominal
aorta and its branch vessels.

No acute osseous abnormality. No worrisome lytic or blastic osseous
lesions. Scattered multilevel degenerative changes present within
the visualized spine.
IMPRESSION: 1. No CT evidence for nephrolithiasis or obstructive uropathy.
2. No other acute intra-abdominal or pelvic process identified.
3. Moderate right pleural effusion, incompletely visualized.
4. Prominent nodes measuring up to 1.1 cm in short axis adjacent to
distal esophagus. These are similar relative to prior CT from
05/26/2011.

## 2016-07-22 DIAGNOSIS — J449 Chronic obstructive pulmonary disease, unspecified: Secondary | ICD-10-CM | POA: Diagnosis not present

## 2016-07-22 DIAGNOSIS — J189 Pneumonia, unspecified organism: Secondary | ICD-10-CM | POA: Diagnosis not present

## 2016-07-25 ENCOUNTER — Telehealth: Payer: Self-pay | Admitting: Internal Medicine

## 2016-07-25 NOTE — Telephone Encounter (Signed)
Pt dropped off a from to be filled out from her insurance company. Pt would like this mailed back to her when completed.. Placed in Dr. Demetrios Isaacs color folder up front.. Please advise

## 2016-07-25 NOTE — Telephone Encounter (Signed)
Form has been placed in the red folder.

## 2016-07-28 ENCOUNTER — Telehealth: Payer: Self-pay | Admitting: Internal Medicine

## 2016-07-28 DIAGNOSIS — J189 Pneumonia, unspecified organism: Secondary | ICD-10-CM

## 2016-07-28 DIAGNOSIS — E1122 Type 2 diabetes mellitus with diabetic chronic kidney disease: Secondary | ICD-10-CM

## 2016-07-28 DIAGNOSIS — D860 Sarcoidosis of lung: Secondary | ICD-10-CM

## 2016-07-28 NOTE — Telephone Encounter (Signed)
Attempted to call Pamela Collier to inform her that we are mailing her completed paperwork back to her and to inform her of the  $50 charge for Dr. Derrel Nip filling out the paperwork.   No answer. No voicemail.

## 2016-07-28 NOTE — Telephone Encounter (Signed)
Form has been photo copied and put in scan pile. Form has also been mailed back to pt.

## 2016-07-28 NOTE — Telephone Encounter (Signed)
She dropped off a form her insurance wants filled out,  But she has not specified what date it is for, so I am assuming it is for the  Hospitalization  On Jan 31. Form in red folder completed.  The charge is $50

## 2016-07-29 NOTE — Telephone Encounter (Signed)
Spoke with pt and informed her that her paperwork is complete and has been mailed back to her. Pt is also aware of the $50 charge for filling ou the paperwork.

## 2016-07-30 DIAGNOSIS — J449 Chronic obstructive pulmonary disease, unspecified: Secondary | ICD-10-CM | POA: Diagnosis not present

## 2016-07-30 DIAGNOSIS — J189 Pneumonia, unspecified organism: Secondary | ICD-10-CM | POA: Diagnosis not present

## 2016-07-30 IMAGING — US US CHEST/MEDIASTINUM
1 series · 3 of 3 positions shown · non-contrast
Comparison: CT 03/28/2014

CLINICAL DATA: Small right pleural effusion noted on previous CT
[DATE].

EXAM:
CHEST ULTRASOUND

[Series 1: us chest/mediastinum · 0.27mm/px · 3 of 3 slices shown]
[im 1/3]
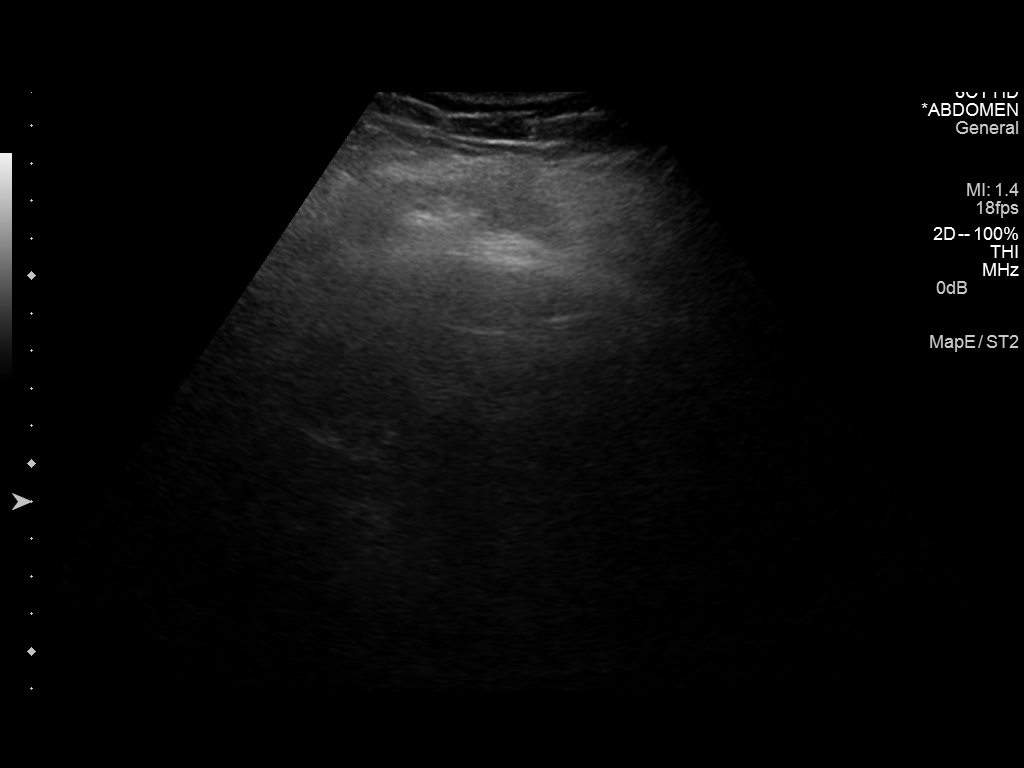
[im 2/3]
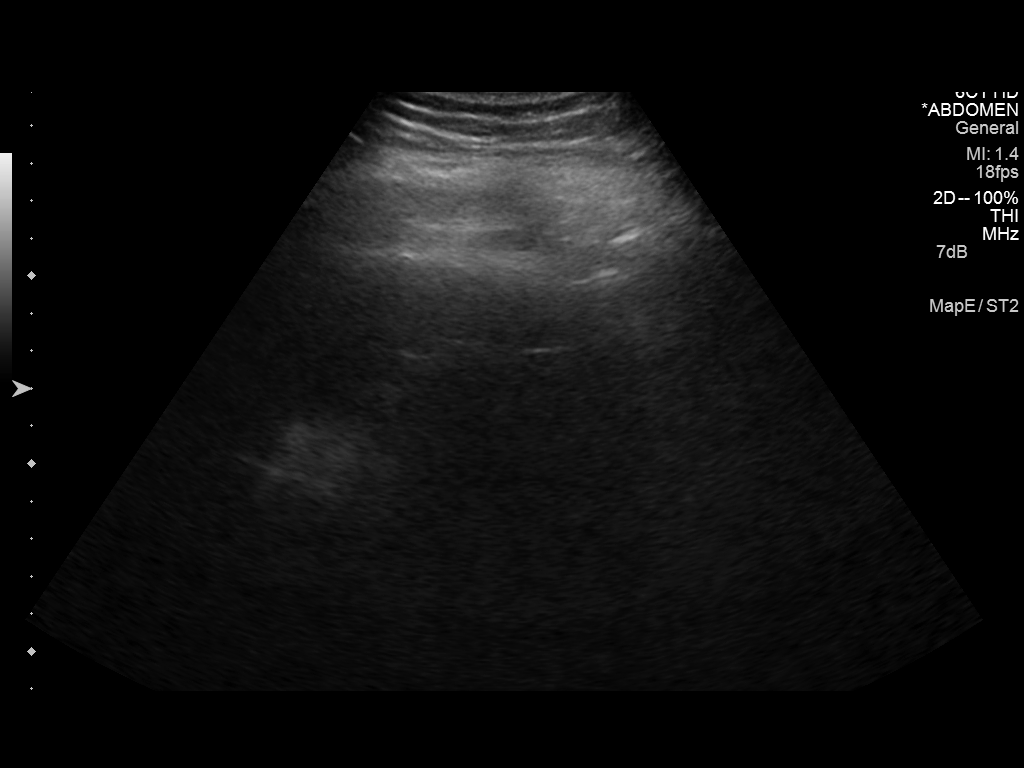
[im 3/3]
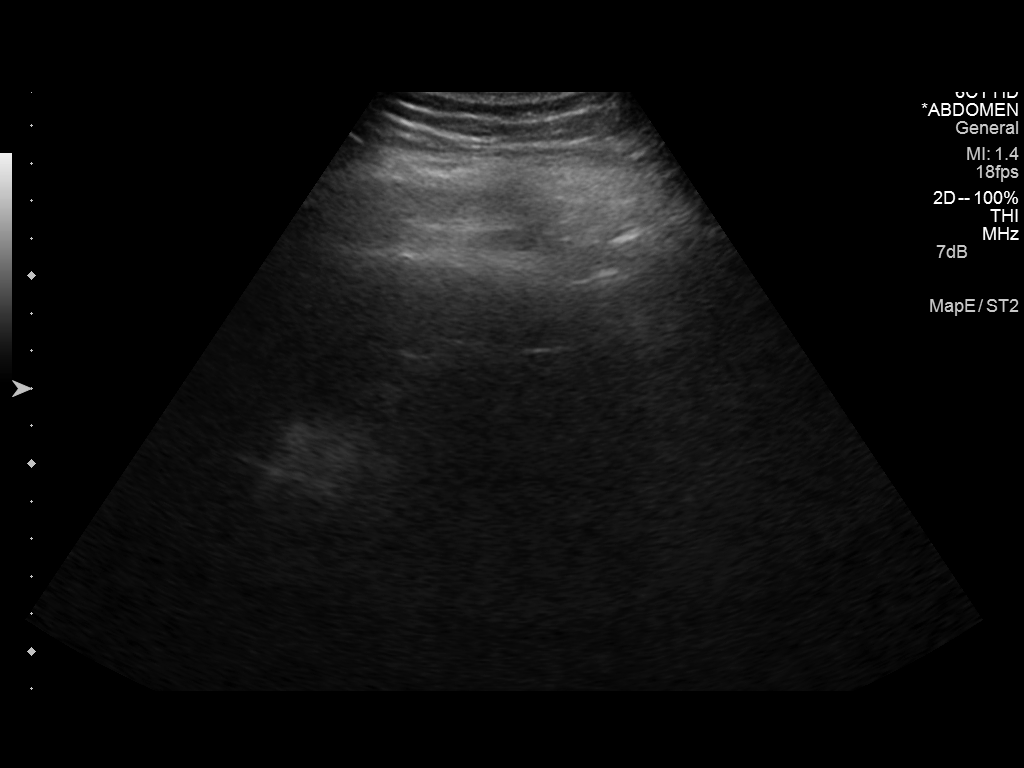

[3 of 3 positions shown; findings below may reference images not displayed]

FINDINGS: Survey ultrasound of the site by sonographer and myself demonstrates
no significant right pleural fluid to allow safe thoracentesis.
IMPRESSION: 1. No significant pleural effusion.  Thoracentesis deferred.

## 2016-07-31 ENCOUNTER — Ambulatory Visit
Admission: RE | Admit: 2016-07-31 | Discharge: 2016-07-31 | Disposition: A | Discharge status: Home / Self Care | Payer: Medicare Other | Source: Ambulatory Visit | Attending: Internal Medicine | Admitting: Internal Medicine

## 2016-07-31 DIAGNOSIS — M4802 Spinal stenosis, cervical region: Secondary | ICD-10-CM | POA: Diagnosis not present

## 2016-07-31 DIAGNOSIS — M50121 Cervical disc disorder at C4-C5 level with radiculopathy: Secondary | ICD-10-CM | POA: Diagnosis not present

## 2016-07-31 DIAGNOSIS — M5412 Radiculopathy, cervical region: Secondary | ICD-10-CM | POA: Diagnosis present

## 2016-07-31 DIAGNOSIS — M542 Cervicalgia: Secondary | ICD-10-CM | POA: Diagnosis not present

## 2016-08-01 ENCOUNTER — Telehealth: Payer: Self-pay | Admitting: Internal Medicine

## 2016-08-01 NOTE — Telephone Encounter (Addendum)
Left message to call for Mateo Flow daughter in law per [facility]

## 2016-08-01 NOTE — Telephone Encounter (Signed)
Patients daughter Mateo Flow advised of results of MRI Cspine per DPR .

## 2016-08-01 NOTE — Telephone Encounter (Signed)
Pt daughter in law Mateo Flow called and stated that Mother in law is very confused on what the results of the MRI was. Daughter in law is on the [place] and would like you to call her with the results so she can understand what is going on. Thank you!  Call Valerie @ [phone]

## 2016-08-03 ENCOUNTER — Telehealth: Payer: Self-pay | Admitting: Internal Medicine

## 2016-08-03 DIAGNOSIS — M4802 Spinal stenosis, cervical region: Secondary | ICD-10-CM

## 2016-08-03 IMAGING — CR DG LUMBAR SPINE COMPLETE 4+V
1 series · 5 of 5 positions shown · non-contrast
Comparison: CT Abdomen and Pelvis 03/28/2014.

CLINICAL DATA: 72-year-old female with left side low back pain for
2 months with no known injury. Initial encounter.

EXAM:
LUMBAR SPINE - COMPLETE 4+ VIEW

[Series 1: t lumbar spine ap · 0.14mm/px · 5 of 5 slices shown]
[im 1/5]
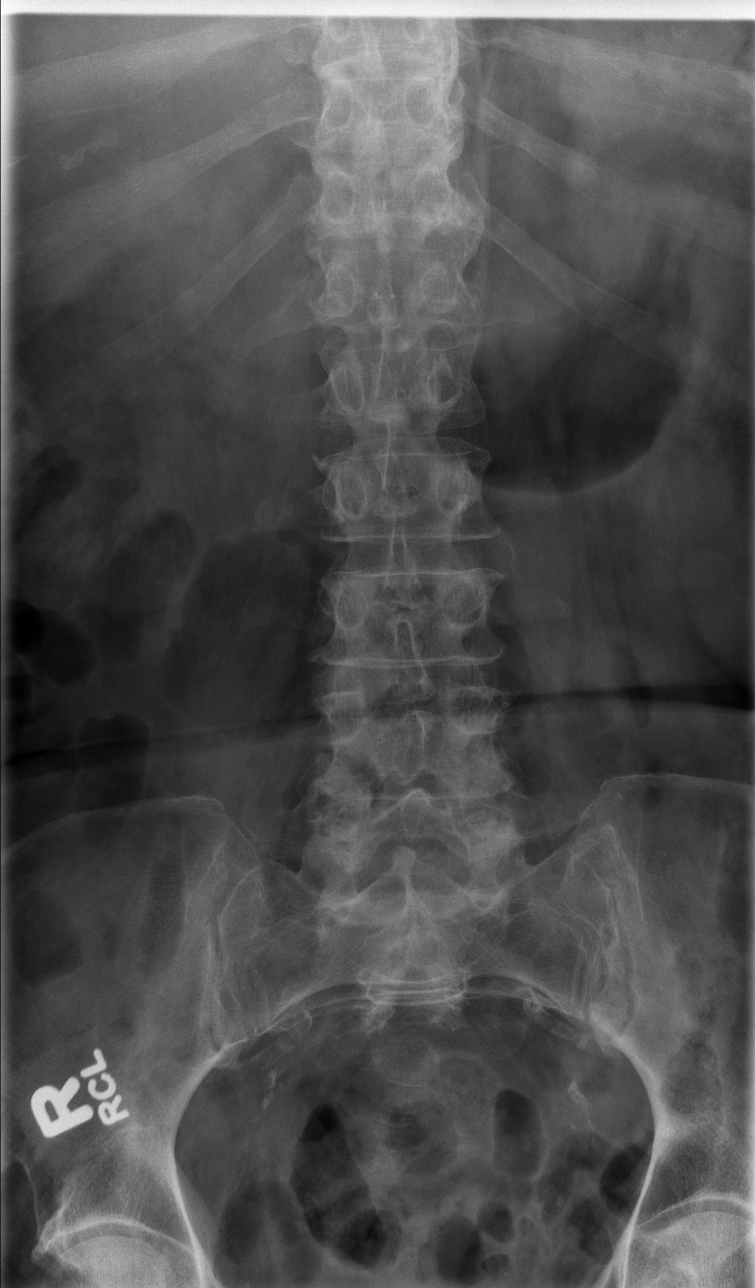
[im 2/5]
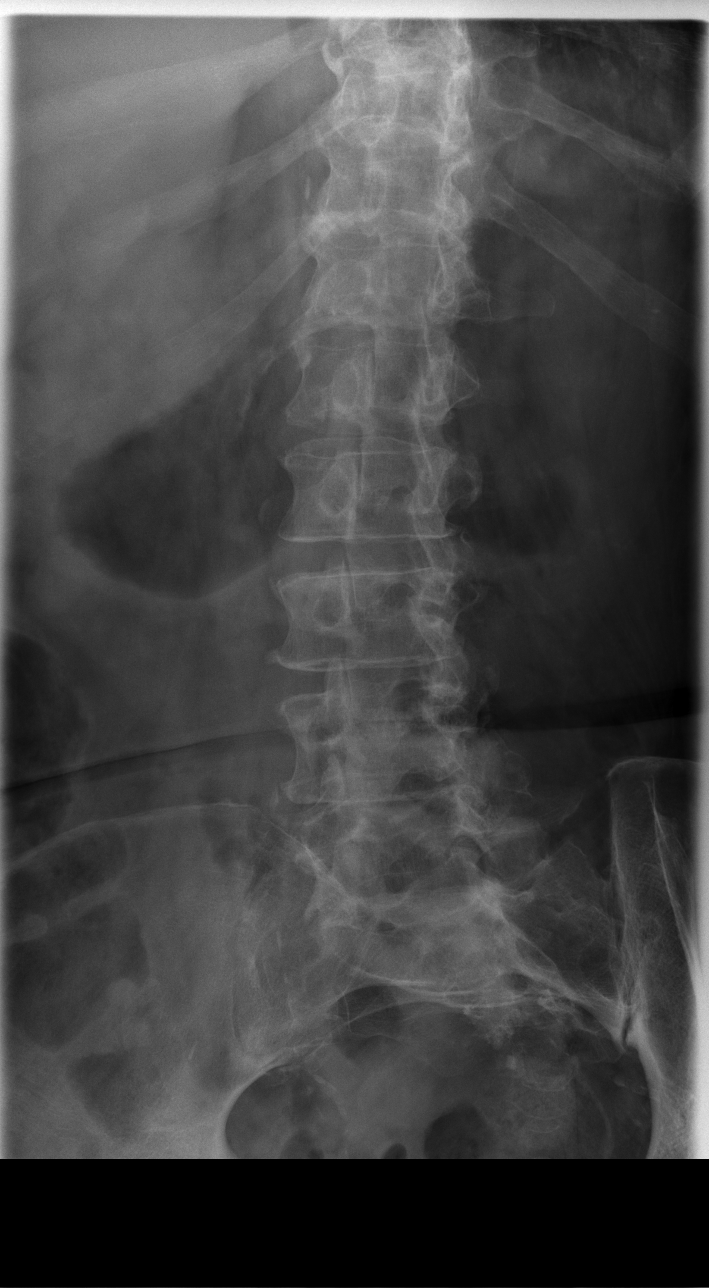
[im 3/5]
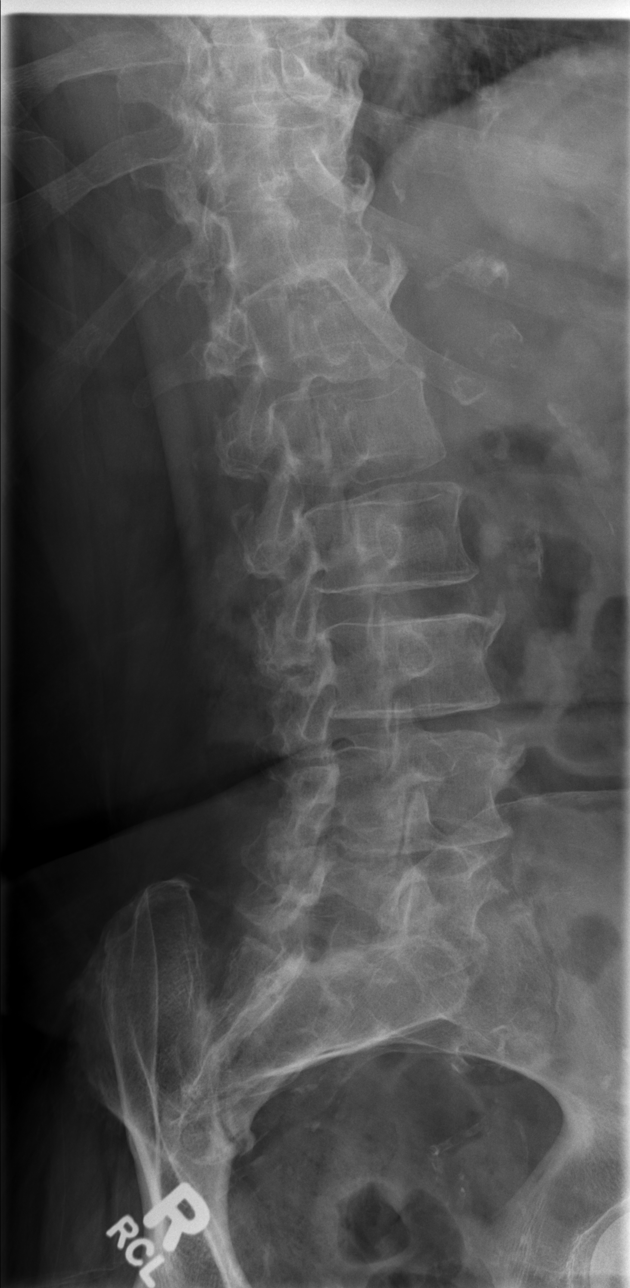
[im 4/5]
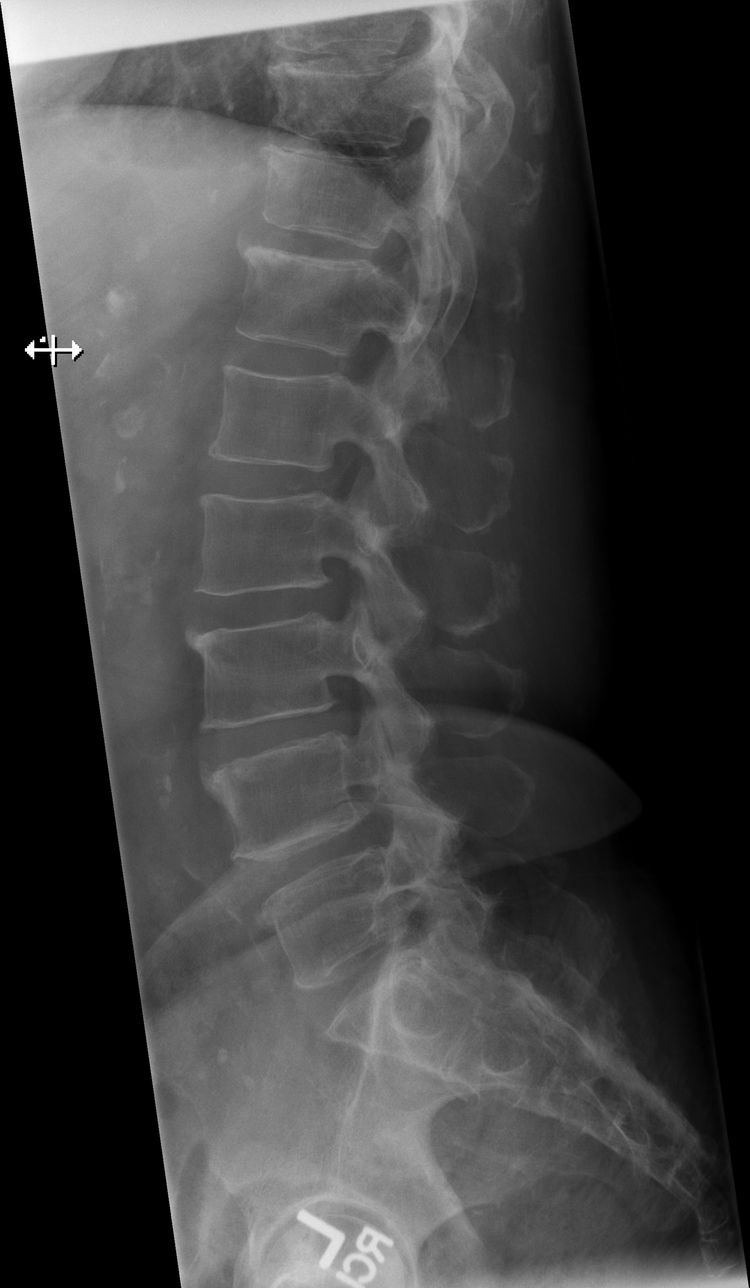
[im 5/5]
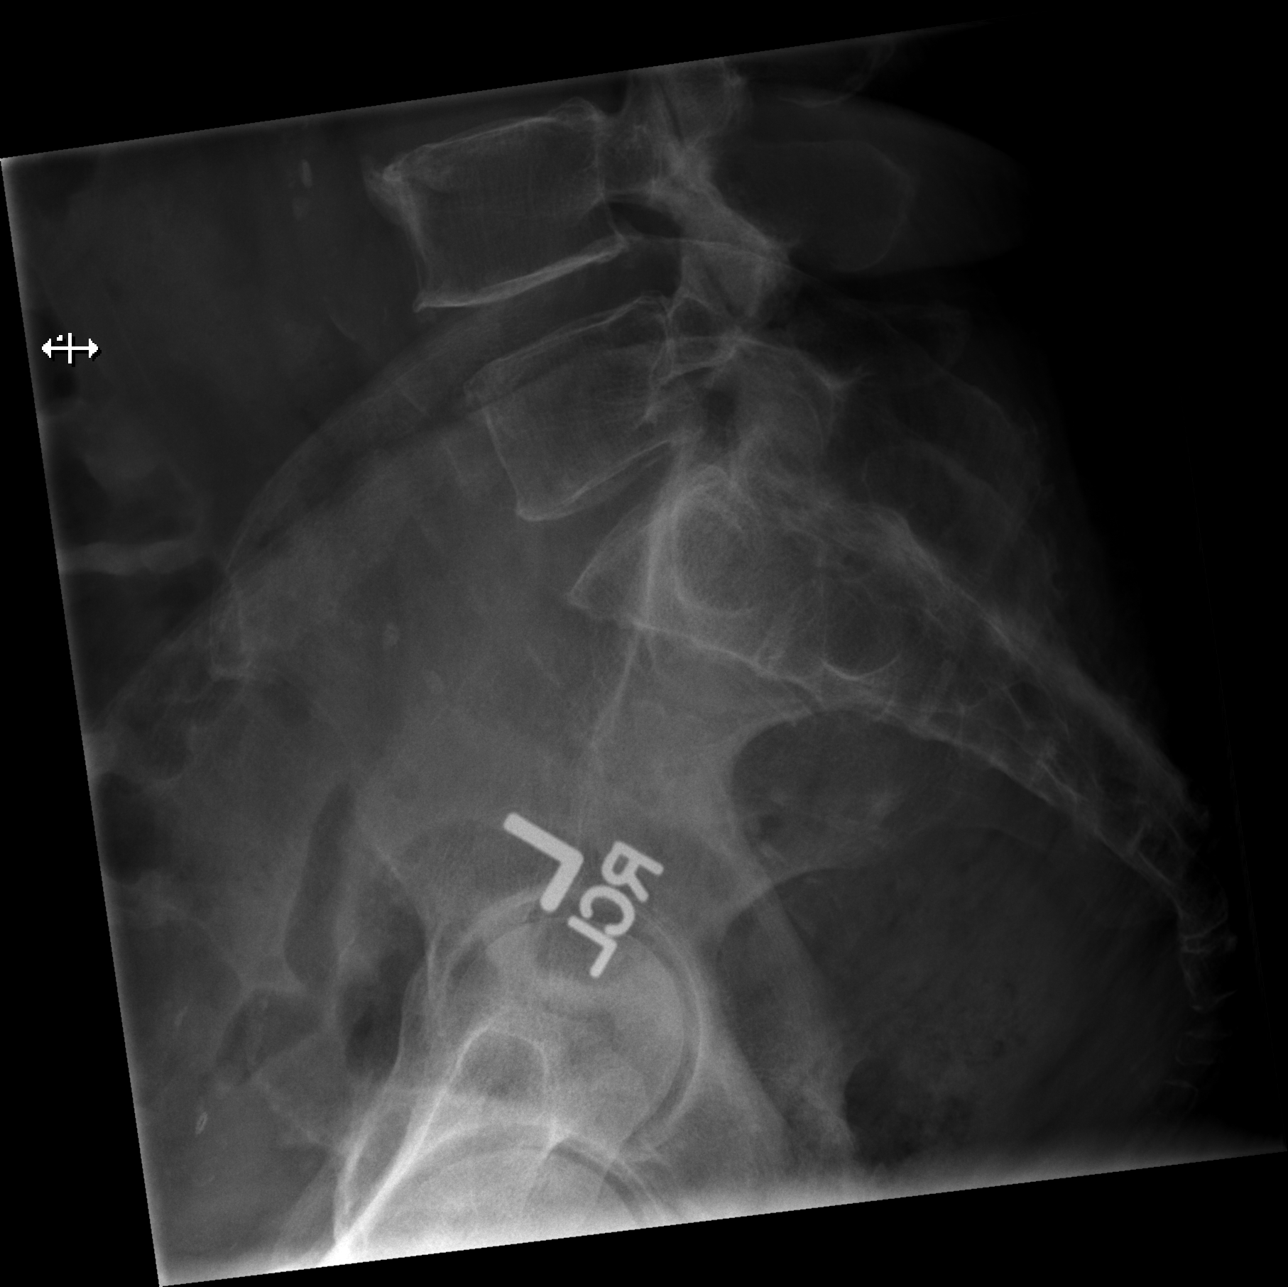

[5 of 5 positions shown; findings below may reference images not displayed]

FINDINGS: Osteopenia. For the purposes of this report, hypoplastic or absent
ribs are designated at T12, with full size ribs at T11. Otherwise
normal lumbar segmentation. Stable lumbar vertebral height and
alignment, including trace anterolisthesis at L4-L5. Preserved disc
spaces. Chronic mid lumbar endplate osteophytes. Chronic lower
thoracic vertebral body wedging appears stable. No pars fracture.
Sacral ala and SI joints within normal limits. Aortoiliac calcified
atherosclerosis noted.
IMPRESSION: No acute osseous abnormality identified in the lumbar spine.

## 2016-08-03 NOTE — Telephone Encounter (Signed)
-----   Message from Johna Sheriff, [place] sent at 08/01/2016  9:25 AM EDT ----- Patient advised of results again and verbalized understanding, she changed her mind and would like to be referred to surgeon in [place].

## 2016-08-03 NOTE — Telephone Encounter (Signed)
Your referral is in process as requested. Our referral coordinator will call you when the appointment has been made.  

## 2016-08-04 NOTE — Telephone Encounter (Signed)
Patient prefers to go [facility] in [place] and would like to see Earnie Larsson, MD.

## 2016-08-04 NOTE — Telephone Encounter (Signed)
Patient requested a call in reference to a referral  Pt contact [phone]

## 2016-08-16 NOTE — Progress Notes (Signed)
Cardiology Office Note  Date:  08/19/2016   ID:  Pamela Collier, DOB 06/19/1940, MRN [id number]  PCP:  [name], MD   Chief Complaint  Patient presents with  . other    6 month follow up. Meds reviewed by the pt. verbally. "doing well."     HPI:  [name] is a pleasant 76 year old woman with history of  diabetes type 2, coronary artery disease,  stent to the LAD, circumflex and RCA,  cardiac catheterizations 2010,2011, 2013, 2016 most recently in [place] with stent to the RCA at that time,  pulmonary sarcoidosis  with moderate airflow restriction, mild restrictive disease on PFTs in 2014, chronic wheezing requiring albuterol nebulizer, pleural effusion presenting 01/11/2015 on CT scan on the right that appear to resolve on follow-up x-rays the end of September 2016,  who presents shortness of breath, chest pain  Several visits to the hospital and emergency room for various issues Chronic shortness of breath, chest pain, general malaise  Reports having trouble at times with clearing secretions  Lab work reviewed with her in detail Total chol 145, ldl 66 HBA1C 7.1 In 06/2016  In the ER 06/29/2016 Hospital records reviewed sudden onset of sharp left-sided chest pain radiating to the back and abdomen at 8 AM, bilateral leg numbness. Had MRI neck: Multilevel cervical disc degeneration with moderate spinal stenosis at C4-5, C5-6, and C6-7.  Severe right neural foraminal stenosis at C5-6. Moderate neural foraminal stenosis on the left at C5-6 and C6-7. Referred to surgery in [place]. Scheduled to meet with them in 2 days on Thursday  Brace on her right foot for small ulceration, followed by Dr. Vickki Muff  denies any significant chest pain on today's visit, no shortness of breath Gait instability, no regular exercise program Stays in the house most of the time  EKG personally reviewed by myself on todays visit shows normal sinus rhythm with rate 60 bpm, LVH with nonspecific  ST-T wave abnormality 1 and aVL, unchanged from previous EKG  Other past medical history reviewed CXR in ER: 01/08/16: mild CHF CBC nl, troponin neg, Creatinine 1.25, BUN 18, creatinine improved down from 1.4  Sleeps with CPAP, not working? Choking, takes sips of water, Uses in the day when napping,  Only uses it for 4 hours a day today  Waxing waning abdominal bloating, not severe "not bad today" Previously went abdominal bloating was severe she had fluid overload, pleural effusion Nausea last night Some chest pains, worse on exertion Anxious, h/a  Previous Echo 01/02/16 reviewed, normal EF, and RVSP Inferior wall HK   echocardiogram May 2016 showed normal ejection fraction, This was done in [place], no mention of right heart pressures. Prior echocardiogram at the end of 2015 done at [facility] documented ejection fraction 45%  CT scan images reviewed with her in detail. CT from 01/11/2015 showed mild to moderate sized pleural effusion on the right extending from base to apex in a supine position. Repeat chest x-ray at the end of September showed no significant effusion  Symptoms seem to present after she had colonoscopy, possibly had IV fluids during the procedure, symptoms improved on higher dose Lasix  She does report having pleural effusions in the past requiring thoracentesis, this was also documented in note by pulmonary No malignancy noted on previous evaluation  Cardiac catheterization from January 2013 documenting ejection fraction 35-40%, 40% mid LAD, 50% distal LAD, small diagonal with 70% disease, 50% proximal left circumflex, 75% distal left circumflex with patent stent,  small RCA, calcified, severe atherosclerosis, 90% distal RCA  Details from catheterization March 2016 are not available, request made to hospital in [place] on today's visit for records   PMH:   has a past medical history of Acute respiratory failure ([place]) (11/21/2014); Arthritis (08/05/2013);  Atherosclerotic peripheral vascular disease with gangrene [facility]) (august 2012); Cardiomyopathy, ischemic; Cerebral infarct ([place]) (08/17/2013); Chronic diastolic CHF (congestive heart failure) (HCC); COPD (chronic obstructive pulmonary disease) ([place]); Coronary artery disease, occlusive; 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  PSH:    Past Surgical History:  Procedure Laterality Date  . ABDOMINAL HYSTERECTOMY     at ge 39. secondary to bleeding/partial  . BREAST CYST ASPIRATION Right   . CARPAL TUNNEL RELEASE Bilateral   . CHOLECYSTECTOMY     in [place]   . COLONOSCOPY WITH PROPOFOL N/A 01/09/2015   Procedure: COLONOSCOPY WITH PROPOFOL;  Surgeon: Lucilla Lame, MD;  Location: [facility] ENDOSCOPY;  Service: Endoscopy;  Laterality: N/A;  . CORONARY ANGIOPLASTY WITH STENT PLACEMENT     [place]; [facility]  . HERNIA REPAIR     umbilical/ Dr Pat Patrick  . PTCA  August 2012   Right Posterior tibial artery , [place]  . 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 acetaminophen (TYLENOL) 500 MG tablet Take 500 mg by mouth every 6 (six) hours as needed for mild pain or headache.    . albuterol (PROAIR HFA) 108 (90 Base) MCG/ACT inhaler Inhale 2 puffs into the lungs every 6 (six) hours as needed for wheezing or shortness of breath. 1 Inhaler 2  . albuterol (PROVENTIL) (2.5 MG/3ML) 0.083% nebulizer solution Take 3 mLs (2.5 mg total) by nebulization every 6 (six) hours as needed for wheezing or shortness of  breath. 150 mL 1  . alendronate (FOSAMAX) 70 MG tablet Take 1 tablet (70 mg total) by mouth once a week. Pt takes on Sunday.   Take with a full glass of water on an empty stomach. 4 tablet 11  . aspirin EC 81 MG tablet Take 81 mg by mouth daily.    . B-D UF III MINI PEN NEEDLES 31G X 5 MM MISC USE THREE TIMES A DAY 270 each 3  . benzonatate (TESSALON) 100 MG capsule Take 200 mg by mouth 3 (three) times daily as needed for cough. Reported on 11/09/2015    . BRILINTA 90 MG TABS tablet TAKE 1 TABLET TWICE A DAY 180 tablet 1  . budesonide-formoterol (SYMBICORT) 160-4.5 MCG/ACT inhaler Inhale 2 puffs into the lungs 2 (two) times daily. 1 Inhaler 0  . diazepam (VALIUM) 5 MG tablet One tablet one hour before the MRI.  May repeat once if needed 2 tablet 0  . docusate sodium (COLACE) 100 MG capsule Take 100 mg by mouth 2 (two) times daily as needed for mild constipation. Reported on 11/09/2015    . fluticasone (FLONASE) 50 MCG/ACT nasal spray Place 2 sprays into both nostrils daily as needed for rhinitis.    . furosemide (LASIX) 20 MG tablet Take 1 tablet (20 mg total) by mouth daily. 90 tablet 1  . glucose blood (ACCU-CHEK AVIVA PLUS) test  strip USE TO CHECK GLUCOSE  THREE TIMES DAILY 300 each 3  . hydrOXYzine (ATARAX/VISTARIL) 10 MG tablet Take 1 tablet (10 mg total) by mouth 3 (three) times daily as needed. 30 tablet 0  . Insulin Lispro Prot & Lispro (HUMALOG MIX 75/25 KWIKPEN) (75-25) 100 UNIT/ML Kwikpen Inject 45 to 60 units twice daily before morning and evening meals 112 mL 3  . isosorbide mononitrate (IMDUR) 30 MG 24 hr tablet TAKE 1 TABLET DAILY 90 tablet 1  . losartan (COZAAR) 100 MG tablet TAKE 1 TABLET DAILY 90 tablet 0  . metoprolol succinate (TOPROL-XL) 25 MG 24 hr tablet Take 1 tablet (25 mg total) by mouth daily. 90 tablet 3  . nitroGLYCERIN (NITROSTAT) 0.4 MG SL tablet Place 1 tablet (0.4 mg total) under the tongue every 5 (five) minutes as needed for chest pain. 25 tablet 3  . ondansetron  (ZOFRAN) 4 MG tablet Take 1 tablet (4 mg total) by mouth every 8 (eight) hours as needed for nausea or vomiting. 20 tablet 0  . PARoxetine (PAXIL) 20 MG tablet TAKE ONE TABLET BY MOUTH ONCE DAILY 90 tablet 1  . polyethylene glycol (MIRALAX / GLYCOLAX) packet Take 17 g by mouth daily as needed for mild constipation. Reported on 10/25/2015    . pregabalin (LYRICA) 100 MG capsule Take 1 capsule (100 mg total) by mouth 3 (three) times daily. 270 capsule 2  . rosuvastatin (CRESTOR) 20 MG tablet Take 20 mg by mouth at bedtime.    . traMADol (ULTRAM) 50 MG tablet Take 50 mg by mouth every 6 (six) hours as needed for moderate pain.    . Vitamin D, Ergocalciferol, (DRISDOL) 50000 units CAPS capsule Take 50,000 Units by mouth every 7 (seven) days. Pt takes on Sunday.     No current facility-administered medications for this visit.    Facility-Administered Medications Ordered in Other Visits  Medication Dose Route Frequency Provider Last Rate Last Dose  . albuterol (PROVENTIL) (5 MG/ML) 0.5% nebulizer solution 2.5 mg  2.5 mg Nebulization Once [name], MD         Allergies:   Cephalexin; Contrast media [iodinated diagnostic agents]; Doxycycline; Gabapentin; and Sulfa antibiotics   Social History:  The patient  reports that she has never smoked. She has never used smokeless tobacco. She reports that she does not drink alcohol or use drugs.   Family History:   family history includes Asthma in her grandchild; Breast cancer in her maternal aunt; Heart attack in her brother, father, and sister; Heart disease in her brother, father, and sister.    Review of Systems: Review of Systems  Constitutional: Positive for malaise/fatigue.  Respiratory: Positive for shortness of breath.   Cardiovascular: Positive for chest pain.  Gastrointestinal: Negative.   Musculoskeletal: Negative.   Neurological: Positive for weakness.  Psychiatric/Behavioral: Negative.   All other systems reviewed and are  negative.    PHYSICAL EXAM: VS:  BP 120/62 (BP Location: Left Arm, Patient Position: Sitting, Cuff Size: Normal)   Pulse 60   Ht 5\' 7"  (1.702 m)   Wt 175 lb 4 oz (79.5 kg)   BMI 27.45 kg/m  , BMI Body mass index is 27.45 kg/m. GEN: Well nourished, well developed, in no acute distress, Gait instability  HEENT: normal  Neck: no JVD, carotid bruits, or masses Cardiac: RRR; no murmurs, rubs, or gallops,no edema  Respiratory:  clear to auscultation bilaterally, normal work of breathing GI: soft, nontender, nondistended, + BS MS: no deformity or atrophy  Skin: warm and dry, no rash Neuro:  Strength and sensation are intact Psych: euthymic mood, full affect    Recent Labs: 05/16/2016: Pro B Natriuretic peptide (BNP) 300.0 06/11/2016: B Natriuretic Peptide 362.0 06/29/2016: ALT 14; BUN 14; Creatinine, Ser 1.05; Hemoglobin 11.1; Platelets 297; Potassium 4.2; Sodium 139    Lipid Panel Lab Results  Component Value Date   CHOL 145 06/18/2016   HDL 47.00 06/18/2016   LDLCALC 66 06/18/2016   TRIG 164.0 (H) 06/18/2016      Wt Readings from Last 3 Encounters:  08/19/16 175 lb 4 oz (79.5 kg)  07/01/16 187 lb (84.8 kg)  07/01/16 187 lb (84.8 kg)       ASSESSMENT AND PLAN:   Angina pectoris (HCC) -  Currently with no symptoms of angina. No further workup at this time. Continue current medication regimen.Last stress test September 2017 showing no ischemia. No further testing needed If she has any recurrent anginal symptoms, recommended nitroglycerin  Chest pain, unspecified chest pain type -  Long history of periodic chest pain, Somewhat atypical in nature Unable to exclude musculoskeletal etiology  SOB (shortness of breath) -  On Lasix daily, appears euvolemic Shortness of breath on exertion likely from deconditioning Recommended she also take albuterol as needed for acute onset of shortness of breath  Coronary artery disease with unspecified angina pectoris - Previous  stress test 6 months ago with no ischemia, no further testing needed    Total encounter time more than 25 minutes  Greater than 50% was spent in counseling and coordination of care with the patient   Disposition:   F/U  12 months   Orders Placed This Encounter  Procedures  . EKG 12-Lead     Signed, Esmond Plants, M.D., Ph.D. 08/19/2016  [place], [place]

## 2016-08-18 ENCOUNTER — Telehealth: Payer: Self-pay | Admitting: *Deleted

## 2016-08-18 NOTE — Telephone Encounter (Signed)
Patient has requested to have her MRI results faxed over to Dr. Glean Salen [phone]

## 2016-08-18 NOTE — Telephone Encounter (Signed)
Faxed per pt request.

## 2016-08-19 ENCOUNTER — Encounter: Payer: Self-pay | Admitting: Cardiovascular Disease

## 2016-08-19 ENCOUNTER — Ambulatory Visit (INDEPENDENT_AMBULATORY_CARE_PROVIDER_SITE_OTHER): Payer: Medicare Other | Admitting: Cardiovascular Disease

## 2016-08-19 VITALS — BP 120/62 | HR 60 | Ht 67.0 in | Wt 175.2 lb

## 2016-08-19 DIAGNOSIS — E1142 Type 2 diabetes mellitus with diabetic polyneuropathy: Secondary | ICD-10-CM | POA: Diagnosis not present

## 2016-08-19 DIAGNOSIS — D2370 Other benign neoplasm of skin of unspecified lower limb, including hip: Secondary | ICD-10-CM | POA: Diagnosis not present

## 2016-08-19 DIAGNOSIS — I25110 Atherosclerotic heart disease of native coronary artery with unstable angina pectoris: Secondary | ICD-10-CM

## 2016-08-19 DIAGNOSIS — M5412 Radiculopathy, cervical region: Secondary | ICD-10-CM | POA: Diagnosis not present

## 2016-08-19 DIAGNOSIS — Z9989 Dependence on other enabling machines and devices: Secondary | ICD-10-CM

## 2016-08-19 DIAGNOSIS — I5032 Chronic diastolic (congestive) heart failure: Secondary | ICD-10-CM | POA: Diagnosis not present

## 2016-08-19 DIAGNOSIS — I200 Unstable angina: Secondary | ICD-10-CM

## 2016-08-19 DIAGNOSIS — R0602 Shortness of breath: Secondary | ICD-10-CM | POA: Diagnosis not present

## 2016-08-19 DIAGNOSIS — I255 Ischemic cardiomyopathy: Secondary | ICD-10-CM

## 2016-08-19 DIAGNOSIS — J441 Chronic obstructive pulmonary disease with (acute) exacerbation: Secondary | ICD-10-CM

## 2016-08-19 DIAGNOSIS — G4733 Obstructive sleep apnea (adult) (pediatric): Secondary | ICD-10-CM

## 2016-08-19 DIAGNOSIS — E1140 Type 2 diabetes mellitus with diabetic neuropathy, unspecified: Secondary | ICD-10-CM

## 2016-08-19 DIAGNOSIS — B351 Tinea unguium: Secondary | ICD-10-CM | POA: Diagnosis not present

## 2016-08-19 DIAGNOSIS — E1165 Type 2 diabetes mellitus with hyperglycemia: Secondary | ICD-10-CM

## 2016-08-19 DIAGNOSIS — IMO0002 Reserved for concepts with insufficient information to code with codable children: Secondary | ICD-10-CM

## 2016-08-19 DIAGNOSIS — E1042 Type 1 diabetes mellitus with diabetic polyneuropathy: Secondary | ICD-10-CM | POA: Diagnosis not present

## 2016-08-19 DIAGNOSIS — S90424A Blister (nonthermal), right lesser toe(s), initial encounter: Secondary | ICD-10-CM | POA: Diagnosis not present

## 2016-08-19 NOTE — Patient Instructions (Addendum)
Medication Instructions:   No medication changes made  Labwork:  No new labs needed  Testing/Procedures:  No further testing at this time   I recommend watching educational videos on topics of interest to you at:       [email or web address]  Enter code: [email or web address]    Follow-Up: It was a pleasure seeing you in the office today. Please call us if you have new issues that need to be addressed before your next appt.  [phone]  Your physician wants you to follow-up in: 12 months.  You will receive a reminder letter in the mail two months in advance. If you don't receive a letter, please call our office to schedule the follow-up appointment.  If you need a refill on your cardiac medications before your next appointment, please call your pharmacy.     

## 2016-08-21 DIAGNOSIS — M4802 Spinal stenosis, cervical region: Secondary | ICD-10-CM | POA: Diagnosis not present

## 2016-08-24 ENCOUNTER — Telehealth: Payer: Self-pay | Admitting: Internal Medicine

## 2016-08-25 ENCOUNTER — Telehealth: Payer: Self-pay | Admitting: Cardiovascular Disease

## 2016-08-25 NOTE — Telephone Encounter (Signed)
Received cardiac clearance request for pt to proceed w/ C4-5, C5-6, C6-7 anterior cervical fusion under general anesthesia by Dr. Earnie Larsson. DOS has not been scheduled yet pending this clearance.  Please route to [place] @ [phone].

## 2016-08-27 NOTE — Telephone Encounter (Signed)
Acceptable risk for surgery Stress test 6 months ago showing no ischemia No further testing needed

## 2016-08-28 NOTE — Telephone Encounter (Signed)
Routed to fax # provided. 

## 2016-09-03 ENCOUNTER — Telehealth: Payer: Self-pay | Admitting: Internal Medicine

## 2016-09-03 NOTE — Telephone Encounter (Signed)
Pt would like a call to ok for her to have surgery on her neck and back. Needs clearance.

## 2016-09-03 NOTE — Telephone Encounter (Signed)
Patient has appt scheduled 09/17/16. Patient made aware this is not done over the telephone. She would need to be examined by the provider.

## 2016-09-11 ENCOUNTER — Other Ambulatory Visit: Payer: Self-pay | Admitting: Internal Medicine

## 2016-09-15 NOTE — Progress Notes (Signed)
* [facility]     Assessment and Plan:  The patient is a 76 year old female with dyspnea, and weakness with debility, deconditioning, sleep apnea, and sarcoidosis.  Pre-op pulmonary evaluation/examination.  --Pt is at moderate risks of pulmonary complications with surgery. These were explained today, in order to minimize these she should delay the surgery if she is having a chest cold.  She should use her cpap after the surgery.   Obstructive sleep apnea. --Continue using cpap every night.    Elevated right diaphragm.  -The patient has decreased air entry in the right lung base, review of her chest x-ray shows an elevated right diaphragm, possibly with a diaphragmatic paralysis. This may be related to her previous history of chylous pleural effusion. -Explained to her that there is no particular treatment for this, and it is likely a chronic condition that has been there for some time.  Chronic debility and deconditioning.  --I suspect that this is the greatest contributor to her exertional dyspnea.  --Continue to increase activity as tolerated.   Chronic bronchitis.  --Continue using symbicort bid.    Pulmonary sarcoidosis. --Currently appears stable.   Chylous pleural effusion. -Recurrent, due to sarcoidosis, currently stable.   Date: 09/15/2016  MRN# [id number] Pamela Collier 04-11-1941   Pamela Collier is a 76 y.o. old female seen in follow up for chief complaint of  Chief Complaint  Patient presents with  . Follow-up    pt needs surgery clearance form completed [facility].     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 She has a history of sleep apnea, and has been maintained on CPAP. At her last visit it was noted  that she had significant deconditioning and she was encouraged to continue PT. It was also noted that she was not using PAP very regularly.  She is now using Symbicort inhaler 2 puff bid. She is rinsing her mouth after use. She is also using her CPAP every night, she got a cleaning device to use with it.  Today she notes that she is going for a cervical fusion, as she was having a lot of pain.   Review of CXR from 01/08/16; elevated right diaphragm.    chest x-ray 09/12/15, bibasilar atelectasis, minimal pleural effusions, scattered interstitial scarring.  10/2012 CT chest >> small to moderate R lung effusion>> chylothorax, 100% lymphs 10/2013 thoracentesis> cytology negative, WBC 1471, > 90% "small lymphs" 01/2014 CT chest> near complete resolution of pleural effusion, stable lymphadenopathy 08/2014 CT chest [place] ([facility]): small right sided effusion decreased in size, stable mediastinal lymphadenopathy 1.0cm largest, 67m  **Desat walk 09/17/16; at rest on RA her sat was 98% and HR 74. After 180 feet sat was 96% and HR 84, slow pace, minimal dyspnea.  After 360 feet; sat was 95% and HR 92; moderate dyspnea.   Medication:   Outpatient Encounter Prescriptions as of 09/17/2016  Medication Sig  . acetaminophen (TYLENOL) 500 MG tablet Take 500 mg by mouth every 6 (six) hours as needed for mild pain or headache.  . albuterol (PROAIR HFA) 108 (90 Base) MCG/ACT inhaler Inhale 2 puffs into the lungs every 6 (six) hours as needed for wheezing or shortness of breath.  [name] albuterol (PROVENTIL) (2.5  MG/3ML) 0.083% nebulizer solution Take 3 mLs (2.5 mg total) by nebulization every 6 (six) hours as needed for wheezing or shortness of breath.  [name] alendronate (FOSAMAX) 70 MG tablet Take 1 tablet (70 mg total) by mouth once a week. Pt takes on Sunday.   Take with a full glass of water on an empty stomach.  [name] aspirin EC 81 MG tablet Take 81 mg by mouth daily.  . B-D UF III MINI PEN NEEDLES 31G  X 5 MM MISC USE THREE TIMES A DAY  . benzonatate (TESSALON) 100 MG capsule Take 200 mg by mouth 3 (three) times daily as needed for cough. Reported on 11/09/2015  . BRILINTA 90 MG TABS tablet TAKE 1 TABLET TWICE A DAY  . budesonide-formoterol (SYMBICORT) 160-4.5 MCG/ACT inhaler Inhale 2 puffs into the lungs 2 (two) times daily.  . diazepam (VALIUM) 5 MG tablet One tablet one hour before the MRI.  May repeat once if needed  . docusate sodium (COLACE) 100 MG capsule Take 100 mg by mouth 2 (two) times daily as needed for mild constipation. Reported on 11/09/2015  . fluticasone (FLONASE) 50 MCG/ACT nasal spray Place 2 sprays into both nostrils daily as needed for rhinitis.  . furosemide (LASIX) 20 MG tablet Take 1 tablet (20 mg total) by mouth daily.  [name] glucose blood (ACCU-CHEK AVIVA PLUS) test strip USE TO CHECK GLUCOSE  THREE TIMES DAILY  . hydrOXYzine (ATARAX/VISTARIL) 10 MG tablet Take 1 tablet (10 mg total) by mouth 3 (three) times daily as needed.  . Insulin Lispro Prot & Lispro (HUMALOG MIX 75/25 KWIKPEN) (75-25) 100 UNIT/ML Kwikpen Inject 45 to 60 units twice daily before morning and evening meals  . isosorbide mononitrate (IMDUR) 30 MG 24 hr tablet TAKE 1 TABLET DAILY  . losartan (COZAAR) 100 MG tablet TAKE 1 TABLET DAILY  . metoprolol succinate (TOPROL-XL) 25 MG 24 hr tablet Take 1 tablet (25 mg total) by mouth daily.  . nitroGLYCERIN (NITROSTAT) 0.4 MG SL tablet Place 1 tablet (0.4 mg total) under the tongue every 5 (five) minutes as needed for chest pain.  [name] ondansetron (ZOFRAN) 4 MG tablet Take 1 tablet (4 mg total) by mouth every 8 (eight) hours as needed for nausea or vomiting.  [name] PARoxetine (PAXIL) 20 MG tablet TAKE ONE TABLET BY MOUTH ONCE DAILY  . polyethylene glycol (MIRALAX / GLYCOLAX) packet Take 17 g by mouth daily as needed for mild constipation. Reported on 10/25/2015  . pregabalin (LYRICA) 100 MG capsule Take 1 capsule (100 mg total) by mouth 3 (three) times daily.  .  rosuvastatin (CRESTOR) 20 MG tablet Take 20 mg by mouth at bedtime.  . traMADol (ULTRAM) 50 MG tablet Take 50 mg by mouth every 6 (six) hours as needed for moderate pain.  . Vitamin D, Ergocalciferol, (DRISDOL) 50000 units CAPS capsule Take 50,000 Units by mouth every 7 (seven) days. Pt takes on Sunday.   Facility-Administered Encounter Medications as of 09/17/2016  Medication  . albuterol (PROVENTIL) (5 MG/ML) 0.5% nebulizer solution 2.5 mg     Allergies:  Cephalexin; Contrast media [iodinated diagnostic agents]; Doxycycline; Gabapentin; and Sulfa antibiotics  Review of Systems: Gen:  Denies  fever, sweats. HEENT: Denies blurred vision. Cvc:  No dizziness, chest pain or heaviness Resp:   Denies cough or sputum porduction. Gi: Denies swallowing difficulty, stomach pain. constipation, bowel incontinence Gu:  Denies bladder incontinence, burning urine Ext:   No Joint pain, stiffness. Skin: No skin rash, easy bruising. Endoc:  No polyuria, polydipsia.  Psych: No depression, insomnia. Other:  All other systems were reviewed and found to be negative other than what is mentioned in the HPI.   Physical Examination:   VS: There were no vitals taken for this visit.  General Appearance: No distress  Neuro:without focal findings,  speech normal,  HEENT: PERRLA, EOM intact. Pulmonary: normal breath sounds, No wheezing. Decreased air entry in right lower lobe.    CardiovascularNormal S1,S2.  No m/r/g.   Abdomen: Benign, Soft, non-tender. Renal:  No costovertebral tenderness  GU:  Not performed at this time. Endoc: No evident thyromegaly, no signs of acromegaly. Skin:   warm, no rash. Extremities: normal, no cyanosis, clubbing.   LABORATORY PANEL:   CBC No results for input(s): WBC, HGB, HCT, PLT in the last 168 hours. ------------------------------------------------------------------------------------------------------------------  Chemistries  No results for input(s): NA, K, CL, CO2,  GLUCOSE, BUN, CREATININE, CALCIUM, MG, AST, ALT, ALKPHOS, BILITOT in the last 168 hours.  Invalid input(s): GFRCGP ------------------------------------------------------------------------------------------------------------------  Cardiac Enzymes No results for input(s): TROPONINI in the last 168 hours. ------------------------------------------------------------  RADIOLOGY:   No results found for this or any previous visit. Results for orders placed during the hospital encounter of 09/12/15  DG Chest 2 View   Narrative CLINICAL DATA:  Shortness of breath and chest pain. History of sarcoidosis  EXAM: CHEST  2 VIEW  COMPARISON:  Chest radiograph June 28, 2015 and chest CT July 11, 2015  FINDINGS: There is no edema or consolidation. Heart size and pulmonary vascularity are normal. No adenopathy. Calcified left mediastinal lymph nodes likely is secondary to sarcoidosis. No bone lesions. No pneumothorax.  IMPRESSION: Calcified lymph nodes, a finding likely secondary to prior sarcoidosis. No edema or consolidation.   Electronically Signed   By: Lowella Grip III M.D.   On: 09/12/2015 12:46    ------------------------------------------------------------------------------------------------------------------  Thank  you for allowing [facility] to assist in the care of your patient. Our recommendations are noted above.  Please contact us if we can be of further service.   Marda Stalker, MD.   Pulmonary and Critical Care Office Number: [phone]  Patricia Pesa, M.D.  Merton Border, M.D  09/15/2016

## 2016-09-17 ENCOUNTER — Ambulatory Visit (INDEPENDENT_AMBULATORY_CARE_PROVIDER_SITE_OTHER): Payer: Medicare Other | Admitting: Internal Medicine

## 2016-09-17 ENCOUNTER — Encounter: Payer: Self-pay | Admitting: Internal Medicine

## 2016-09-17 VITALS — BP 130/62 | HR 83 | Resp 16 | Ht 67.0 in | Wt 183.0 lb

## 2016-09-17 DIAGNOSIS — R942 Abnormal results of pulmonary function studies: Secondary | ICD-10-CM | POA: Diagnosis not present

## 2016-09-17 DIAGNOSIS — Z9989 Dependence on other enabling machines and devices: Secondary | ICD-10-CM | POA: Diagnosis not present

## 2016-09-17 DIAGNOSIS — G4733 Obstructive sleep apnea (adult) (pediatric): Secondary | ICD-10-CM

## 2016-09-17 DIAGNOSIS — I255 Ischemic cardiomyopathy: Secondary | ICD-10-CM | POA: Diagnosis not present

## 2016-09-17 DIAGNOSIS — D860 Sarcoidosis of lung: Secondary | ICD-10-CM | POA: Diagnosis not present

## 2016-09-17 NOTE — Patient Instructions (Signed)
--  Continue using symbicort.  --Use cpap every night.

## 2016-09-19 ENCOUNTER — Other Ambulatory Visit: Payer: Self-pay | Admitting: Neurosurgery

## 2016-09-22 ENCOUNTER — Encounter (HOSPITAL_COMMUNITY): Payer: Self-pay | Admitting: *Deleted

## 2016-09-22 NOTE — Telephone Encounter (Signed)
Please advise how many days pt should be off Brilinta prior to surgery.

## 2016-09-22 NOTE — Progress Notes (Signed)
   How to Manage Your Diabetes Before and After Surgery  Why is it important to control my blood sugar before and after surgery? . Improving blood sugar levels before and after surgery helps healing and can limit problems. . A way of improving blood sugar control is eating a healthy diet by: o  Eating less sugar and carbohydrates o  Increasing activity/exercise o  Talking with your doctor about reaching your blood sugar goals . High blood sugars (greater than 180 mg/dL) can raise your risk of infections and slow your recovery, so you will need to focus on controlling your diabetes during the weeks before surgery. . Make sure that the doctor who takes care of your diabetes knows about your planned surgery including the date and location.  How do I manage my blood sugar before surgery? . Check your blood sugar at least 4 times a day, starting 2 days before surgery, to make sure that the level is not too high or low. o Check your blood sugar the morning of your surgery when you wake up and every 2 hours until you get to the Short Stay unit. . If your blood sugar is less than 70 mg/dL, you will need to treat for low blood sugar: o Do not take insulin. o Treat a low blood sugar (less than 70 mg/dL) with  cup of clear juice (cranberry or apple), 4 glucose tablets, OR glucose gel. o Recheck blood sugar in 15 minutes after treatment (to make sure it is greater than 70 mg/dL). If your blood sugar is not greater than 70 mg/dL on recheck, call [phone] for further instructions. . Report your blood sugar to the short stay nurse when you get to Short Stay.  . If you are admitted to the hospital after surgery: o Your blood sugar will be checked by the staff and you will probably be given insulin after surgery (instead of oral diabetes medicines) to make sure you have good blood sugar levels. o The goal for blood sugar control after surgery is 80-180 mg/dL.              WHAT DO I DO ABOUT  MY DIABETES MEDICATION?   . Do not take oral diabetes medicines (pills) the morning of surgery.  . THE NIGHT BEFORE SURGERY, take ___________ units of ___________insulin.       . HE MORNING OF SURGERY, take _____________ units of __________insulin.  . The day of surgery, do not take other diabetes injectables, including Byetta (exenatide), Bydureon (exenatide ER), Victoza (liraglutide), or Trulicity (dulaglutide).  . If your CBG is greater than 220 mg/dL, you may take  of your sliding scale (correction) dose of insulin.  Other Instructions:          Patient Signature:  Date:   Nurse Signature:  Date:   Reviewed and Endorsed by [place] Patient Education Committee, August 2015 

## 2016-09-22 NOTE — Telephone Encounter (Signed)
5 days should be sufficient

## 2016-09-22 NOTE — Progress Notes (Signed)
Called and spoke with patient and her husband.  Instructions given for arrival time and what meds to take in the morning.  On her insulin, I have instructed her to take her normal dose of Humalog Mix 75/25 this am (42 units) and for the usual evening dose (60) to take 70% of that, which is 42 units tonight.  NONE in the am. Her AM blood sugars run 124-127. Have instructed her also to bring her CPAP mask. Patient unsure as to when or if she has stopped brilinta.  Will let Dr. Marchelle Folks office know. Spoke with Lorriane Shire..she will call patient and obtain more info.

## 2016-09-30 DIAGNOSIS — L97511 Non-pressure chronic ulcer of other part of right foot limited to breakdown of skin: Secondary | ICD-10-CM | POA: Diagnosis not present

## 2016-09-30 DIAGNOSIS — E1042 Type 1 diabetes mellitus with diabetic polyneuropathy: Secondary | ICD-10-CM | POA: Diagnosis not present

## 2016-09-30 DIAGNOSIS — M2041 Other hammer toe(s) (acquired), right foot: Secondary | ICD-10-CM | POA: Diagnosis not present

## 2016-10-02 ENCOUNTER — Ambulatory Visit: Payer: Medicare Other | Admitting: Internal Medicine

## 2016-10-07 IMAGING — US US EXTREM LOW VENOUS BILAT
1 series · 13 of 24 positions shown · non-contrast
Comparison: None.



[Series 1: us extrem low venous bilat · 0.08mm/px · 13 of 60 slices shown]
[im 1/60]
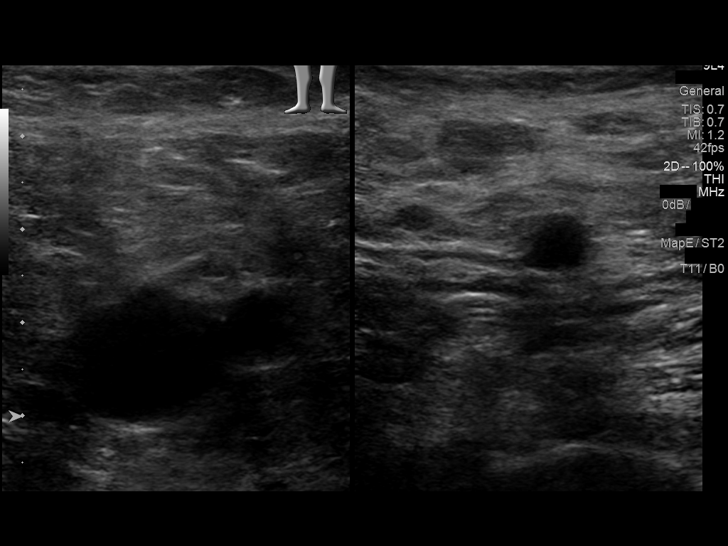
[im 6/60]
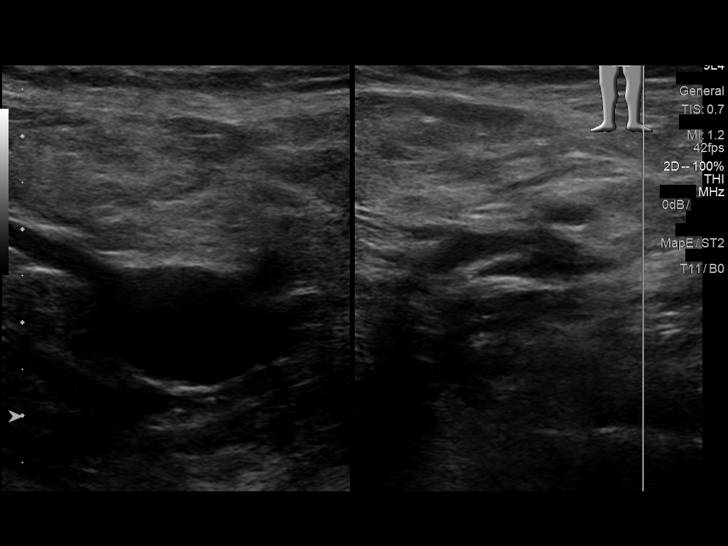
[im 11/60]
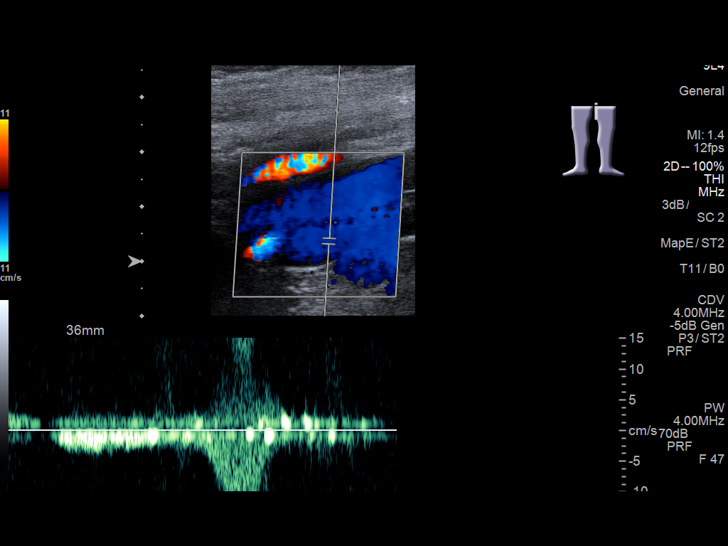
[im 16/60]
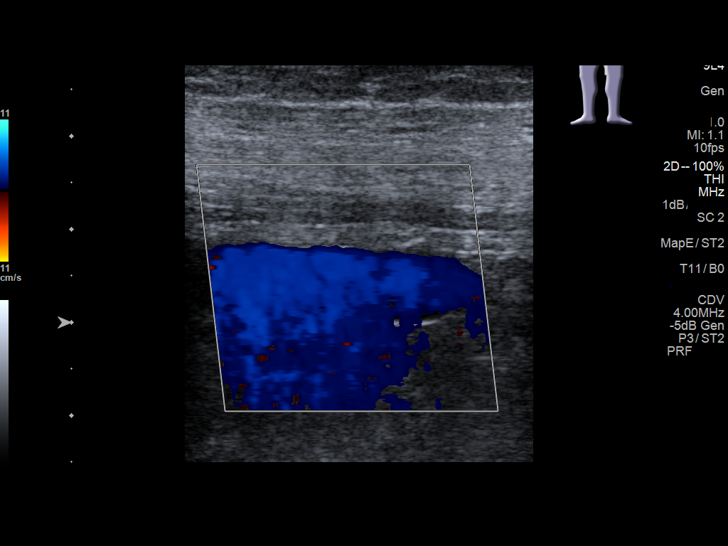
[im 21/60]
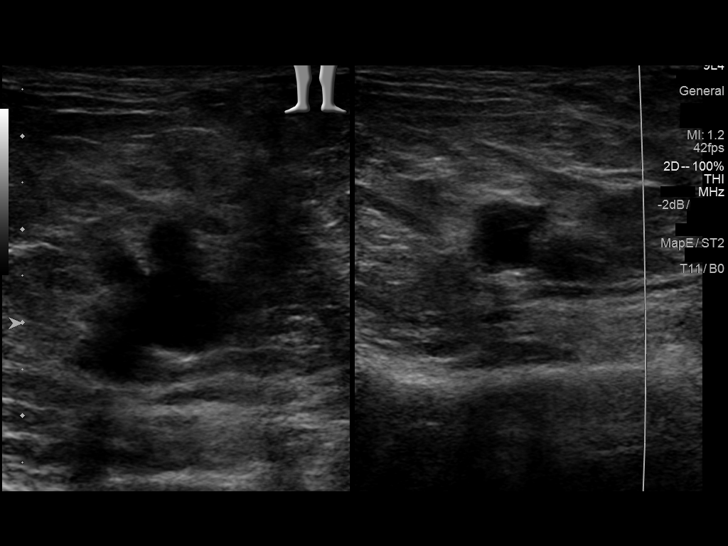
[im 26/60]
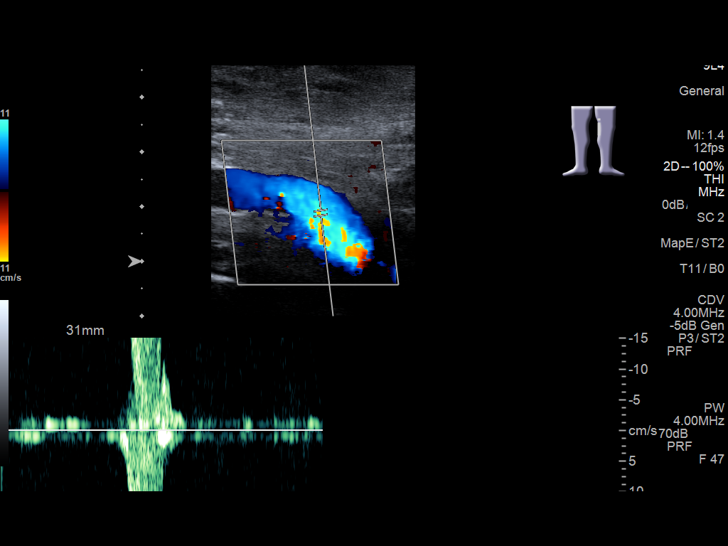
[im 31/60]
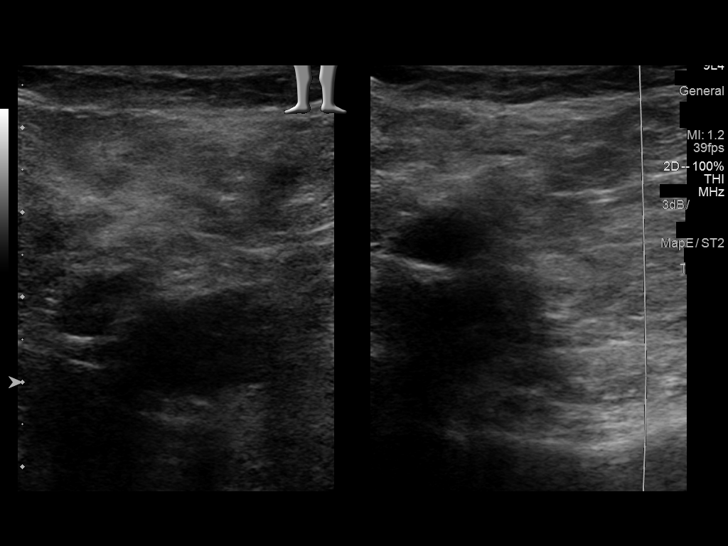
[im 34/60]
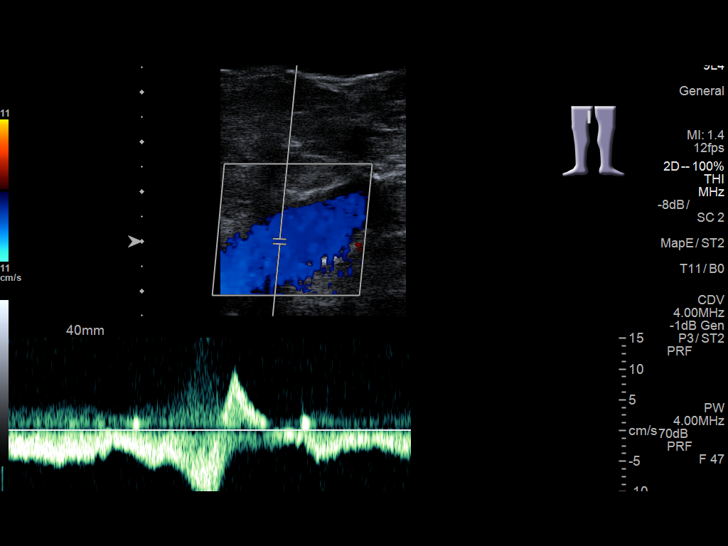
[im 39/60]
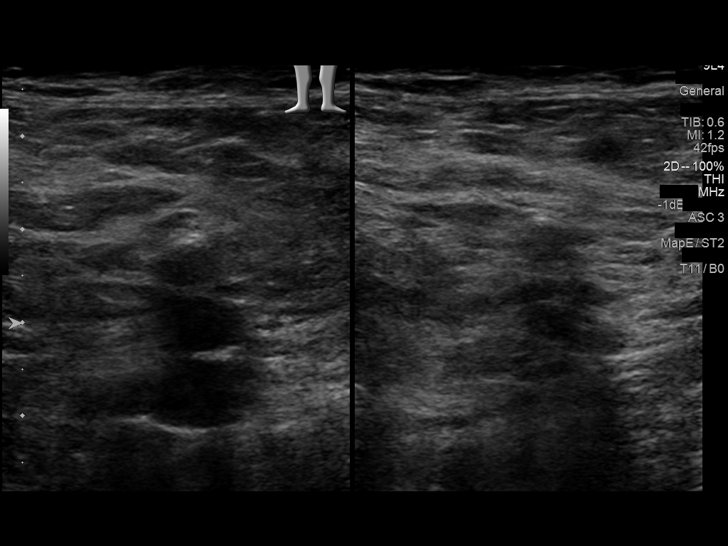
[im 44/60]
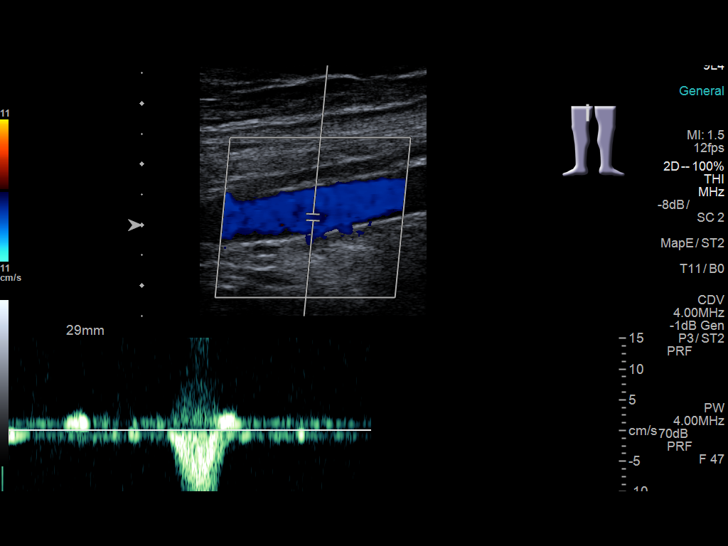
[im 49/60]
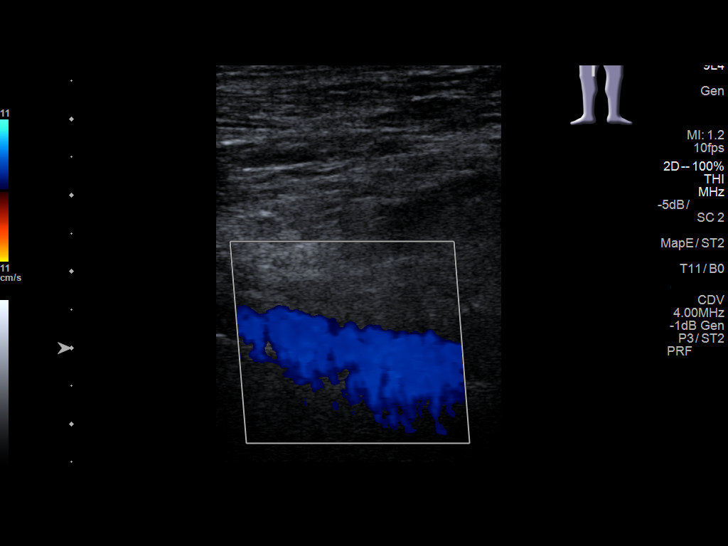
[im 54/60]
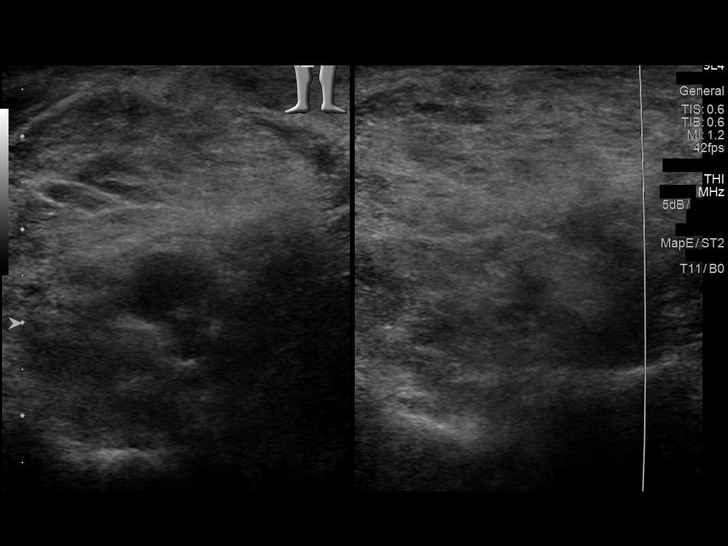
[im 60/60]
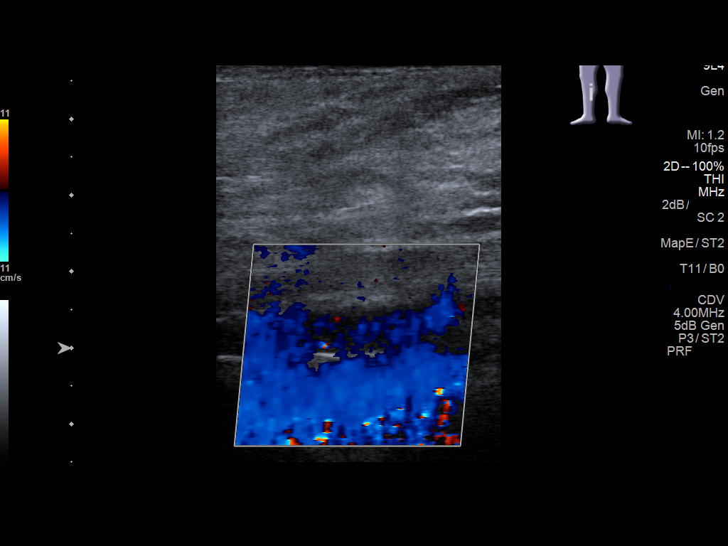

[13 of 24 positions shown; findings below may reference images not displayed]

FINDINGS: RIGHT LOWER EXTREMITY

Common Femoral Vein: No evidence of thrombus. Normal
compressibility, respiratory phasicity and response to augmentation.

Saphenofemoral Junction: No evidence of thrombus. Normal
compressibility and flow on color Doppler imaging.

Profunda Femoral Vein: No evidence of thrombus. Normal
compressibility and flow on color Doppler imaging.

Femoral Vein: No evidence of thrombus. Normal compressibility,
respiratory phasicity and response to augmentation.

Popliteal Vein: No evidence of thrombus. Normal compressibility,
respiratory phasicity and response to augmentation.

Calf Veins: No evidence of thrombus. Normal compressibility and flow
on color Doppler imaging.

Superficial Great Saphenous Vein: No evidence of thrombus. Normal
compressibility and flow on color Doppler imaging.

Venous Reflux:  None.

Other Findings:  None.

LEFT LOWER EXTREMITY

Common Femoral Vein: No evidence of thrombus. Normal
compressibility, respiratory phasicity and response to augmentation.

Saphenofemoral Junction: No evidence of thrombus. Normal
compressibility and flow on color Doppler imaging.

Profunda Femoral Vein: No evidence of thrombus. Normal
compressibility and flow on color Doppler imaging.

Femoral Vein: No evidence of thrombus. Normal compressibility,
respiratory phasicity and response to augmentation.

Popliteal Vein: No evidence of thrombus. Normal compressibility,
respiratory phasicity and response to augmentation.

Calf Veins: No evidence of thrombus. Normal compressibility and flow
on color Doppler imaging.

Superficial Great Saphenous Vein: No evidence of thrombus. Normal
compressibility and flow on color Doppler imaging.

Venous Reflux:  None.

Other Findings:  None.
IMPRESSION: No evidence of deep venous thrombosis.

## 2016-10-08 ENCOUNTER — Encounter (HOSPITAL_COMMUNITY)
Admission: RE | Admit: 2016-10-08 | Discharge: 2016-10-08 | Disposition: A | Discharge status: Home / Self Care | Payer: Medicare Other | Source: Ambulatory Visit | Attending: Neurosurgery | Admitting: Neurosurgery

## 2016-10-08 ENCOUNTER — Encounter (HOSPITAL_COMMUNITY): Payer: Self-pay

## 2016-10-08 LAB — BASIC METABOLIC PANEL
Anion gap: 6 (ref 5–15)
BUN: 25 mg/dL — ABNORMAL HIGH (ref 6–20)
CALCIUM: 9.9 mg/dL (ref 8.9–10.3)
CO2: 27 mmol/L (ref 22–32)
CREATININE: 1.25 mg/dL — AB (ref 0.44–1.00)
Chloride: 103 mmol/L (ref 101–111)
GFR calc non Af Amer: 41 mL/min — ABNORMAL LOW (ref >60)
GFR, EST AFRICAN AMERICAN: 47 mL/min — AB (ref >60)
Glucose, Bld: 132 mg/dL — ABNORMAL HIGH (ref 65–99)
Potassium: 4.5 mmol/L (ref 3.5–5.1)
SODIUM: 136 mmol/L (ref 135–145)

## 2016-10-08 LAB — CBC WITH DIFFERENTIAL/PLATELET
BASOS PCT: 0 %
Basophils Absolute: 0 10*3/uL (ref 0.0–0.1)
EOS ABS: 0.1 10*3/uL (ref 0.0–0.7)
Eosinophils Relative: 1 %
HCT: 36.8 % (ref 36.0–46.0)
HEMOGLOBIN: 11.7 g/dL — AB (ref 12.0–15.0)
Lymphocytes Relative: 23 %
Lymphs Abs: 1.6 10*3/uL (ref 0.7–4.0)
MCH: 28.7 pg (ref 26.0–34.0)
MCHC: 31.8 g/dL (ref 30.0–36.0)
MCV: 90.2 fL (ref 78.0–100.0)
MONOS PCT: 11 %
Monocytes Absolute: 0.8 10*3/uL (ref 0.1–1.0)
NEUTROS PCT: 65 %
Neutro Abs: 4.5 10*3/uL (ref 1.7–7.7)
Platelets: 286 10*3/uL (ref 150–400)
RBC: 4.08 MIL/uL (ref 3.87–5.11)
RDW: 15 % (ref 11.5–15.5)
WBC: 7 10*3/uL (ref 4.0–10.5)

## 2016-10-08 LAB — SURGICAL PCR SCREEN
MRSA, PCR: NEGATIVE
STAPHYLOCOCCUS AUREUS: NEGATIVE

## 2016-10-08 NOTE — Pre-Procedure Instructions (Signed)
Pamela Collier  10/08/2016      [place] [address]), [place] [address] [place] ([place])  [address] Phone: [phone] Fax: [phone]  [facility] [place], [place] [address] [place] [address] Phone: [phone] Fax: [phone]    Your procedure is scheduled on 10-10-2016  Friday  Report to [facility] Admitting at 6:00 A.M.   Call this number if you have problems the morning of surgery:  [phone]   Remember:  Do not eat food or drink liquids after midnight.   Take these medicines the morning of surgery with A SIP OF WATER Tylenol if needed,albuerol(ProAir) inhaler if needed,Albuterol(Proventil) nebulizer if needed,alaprazolam(Xanax),isosorbide(Imdur),metoprolol(Toprol-XL),nitroglycerin if needed,pregabalin(Lyricia),rosuvastatin(Crestor)Paroxetine(Paxil)  STOP BRILINTA 5 DAYS PRIOR TO SURGERY  STOP ASPIRIN,ANTIINFLAMATORIES (IBUPROFEN,ALEVE,MOTRIN,ADVIL,GOODY'S POWDERS),HERBAL SUPPLEMENTS,FISH OIL,AND VITAMINS 5-7 DAYS PRIOR TO SURGERY      How to Manage Your Diabetes Before and After Surgery  Why is it important to control my blood sugar before and after surgery? . Improving blood sugar levels before and after surgery helps healing and can limit problems. . A way of improving blood sugar control is eating a healthy diet by: o  Eating less sugar and carbohydrates o  Increasing activity/exercise o  Talking with your doctor about reaching your blood sugar goals . High blood sugars (greater than 180 mg/dL) can raise your risk of infections and slow your recovery, so you will need to focus on controlling your diabetes during the weeks before surgery. . Make sure that the doctor who takes care of your diabetes knows about your planned surgery including the date and location.  How do I manage my blood sugar before surgery? . Check your  blood sugar at least 4 times a day, starting 2 days before surgery, to make sure that the level is not too high or low. o Check your blood sugar the morning of your surgery when you wake up and every 2 hours until you get to the Short Stay unit. . If your blood sugar is less than 70 mg/dL, you will need to treat for low blood sugar: o Do not take insulin. o Treat a low blood sugar (less than 70 mg/dL) with  cup of clear juice (cranberry or apple), 4 glucose tablets, OR glucose gel. o Recheck blood sugar in 15 minutes after treatment (to make sure it is greater than 70 mg/dL). If your blood sugar is not greater than 70 mg/dL on recheck, call [phone] for further instructions. . Report your blood sugar to the short stay nurse when you get to Short Stay.  . If you are admitted to the hospital after surgery: o Your blood sugar will be checked by the staff and you will probably be given insulin after surgery (instead of oral diabetes medicines) to make sure you have good blood sugar levels. o The goal for blood sugar control after surgery is 80-180 mg/dL.              WHAT DO I DO ABOUT MY DIABETES MEDICATION?   [name] Do not take oral diabetes medicines (pills) the morning of surgery.  . THE NIGHT BEFORE SURGERY, take ____42_______ units of ___75/25________insulin.       [name] HE MORNING OF SURGERY, take _______0______ units of ____75/25______insulin.  . The day of surgery, do not take other diabetes injectables, including Byetta (exenatide), Bydureon (exenatide ER), Victoza (liraglutide), or Trulicity (dulaglutide).  [name]  If your CBG is greater than 220 mg/dL, you may take  of your sliding scale (correction) dose of insulin. :  Reviewed and Endorsed by [facility] Patient Education Committee, August 2015   Do not wear jewelry, make-up or nail polish.  Do not wear lotions, powders, or perfumes, or deoderant.  Do not shave 48 hours prior to surgery.  Men may shave face and neck.  Do  not bring valuables to the hospital.  [facility] is not responsible for any belongings or valuables.  Contacts, dentures or bridgework may not be worn into surgery.  Leave your suitcase in the car.  After surgery it may be brought to your room.  For patients admitted to the hospital, discharge time will be determined by your treatment team.  Patients discharged the day of surgery will not be allowed to drive home.    Special Instructions: [place] - Preparing for Surgery  Before surgery, you can play an important role.  Because skin is not sterile, your skin needs to be as free of germs as possible.  You can reduce the number of germs on you skin by washing with CHG (chlorahexidine gluconate) soap before surgery.  CHG is an antiseptic cleaner which kills germs and bonds with the skin to continue killing germs even after washing.  Please DO NOT use if you have an allergy to CHG or antibacterial soaps.  If your skin becomes reddened/irritated stop using the CHG and inform your nurse when you arrive at Short Stay.  Do not shave (including legs and underarms) for at least 48 hours prior to the first CHG shower.  You may shave your face.  Please follow these instructions carefully:   1.  Shower with CHG Soap the night before surgery and the   morning of Surgery.  2.  If you choose to wash your hair, wash your hair first as usual with your normal shampoo.  3.  After you shampoo, rinse your hair and body thoroughly to remove the  Shampoo.  4.  Use CHG as you would any other liquid soap.  You can apply chg directly  to the skin and wash gently with scrungie or a clean washcloth.  5.  Apply the CHG Soap to your body ONLY FROM THE NECK DOWN.   Do not use on open wounds or open sores.  Avoid contact with your eyes,  ears, mouth and genitals (private parts).  Wash genitals (private parts) with your normal soap.  6.  Wash thoroughly, paying special attention to the area where your surgery will be  performed.  7.  Thoroughly rinse your body with warm water from the neck down.  8.  DO NOT shower/wash with your normal soap after using and rinsing o  the CHG Soap.  9.  Pat yourself dry with a clean towel.            10.  Wear clean pajamas.            11.  Place clean sheets on your bed the night of your first shower and do not sleep with pets.  Day of Surgery  Do not apply any lotions/deodorants the morning of surgery.  Please wear clean clothes to the hospital/surgery center.   Please read over the following fact sheets that you were given. MRSA Information and Surgical Site Infection Prevention

## 2016-10-09 LAB — HEMOGLOBIN A1C
Hgb A1c MFr Bld: 7.5 % — ABNORMAL HIGH (ref 4.8–5.6)
MEAN PLASMA GLUCOSE: 169 mg/dL

## 2016-10-09 MED ORDER — VANCOMYCIN HCL IN DEXTROSE 1-5 GM/200ML-% IV SOLN
1000.0000 mg | INTRAVENOUS | Status: AC
  Administered 2016-10-10: 1000 mg via INTRAVENOUS
  Filled 2016-10-09: qty 200

## 2016-10-09 MED ORDER — DEXAMETHASONE SODIUM PHOSPHATE 10 MG/ML IJ SOLN
10.0000 mg | INTRAMUSCULAR | Status: AC
Start: 2016-10-10 — End: 2016-10-10
  Administered 2016-10-10: 10 mg via INTRAVENOUS
  Filled 2016-10-09: qty 1

## 2016-10-09 NOTE — Progress Notes (Signed)
Anesthesia Chart Review:  Pt is a 76 year old female scheduled for C4-5, C5-6, C6-7 ACDF on 10/10/2016 with Earnie Larsson, MD  - PCP is Deborra Medina, MD - Cardiologist is Ida Rogue, MD who cleared pt for surgery - Pulmonologist is [name], MD who cleared pt for surgery at moderate risk at last office visit 09/17/04/2018. - Nephrologist is Lavonia Dana, MD    PMH includes:  CAD (PCI/stents to LAD, CX, RCA in 2010, 2011, 2013, 2016 all in [place]), ischemic cardiomyopathy, chronic diastolic CHF, peripheral vascular disease (as/PR second toe amputation), HTN, DM, hyperlipidemia, OSA, COPD, pulmonary sarcoidosis, CKD (stage III), hepatic steatosis. Never smoker. BMI 30.  - Hospitalized 1/31-06/12/16 for COPD exacerbation, acute bronchitis - Hospitalized 01/02/16 for 1 day for atypical chest pain, ACS ruled out with normal troponin and no acute EKG changes.   Medications include: Albuterol, ASA 81 mg, brilinta, Lasix, Humalog mix 75/25, Imdur, losartan, metoprolol, rosuvastatin. Pt to stop Brilinta 5 days before surgery.   Preoperative labs reviewed. HbA1c 7.5, glucose 132.  CXR 06/29/16: Chronic interstitial prominence, similar to prior studies, and mild cardiomegaly. No acute findings demonstrated  EKG 08/19/16: Wide QRS rhythm. LAD. LVH with QRS widening and repolarization abnormality.  Nuclear stress test 01/29/16:  - Pharmacological myocardial perfusion imaging study with no significant  ischemia - Normal wall motion, EF estimated at 55% - No EKG changes concerning for ischemia at peak stress or in recovery. - Baseline EKG with IVCD and LAFB - Low risk scan  Echo 01/02/16:  - Procedure narrative: Transthoracic echocardiography. The study was technically difficult. - Left ventricle: The cavity size was normal. There was moderate concentric hypertrophy. Systolic function was normal. The estimated ejection fraction was in the range of 55% to 60%. Grossly normal wall motion with  parasternal short axis images suggestive of moderate hypokinesis in the basal to mid inferior and posterior region. Not well visualized on apical images. Left ventricular diastolic function parameters were normal. - Left atrium: The atrium was mildly dilated. - Right ventricle: Systolic function was normal. - Pulmonary arteries: Systolic pressure was within the normal range. - Impressions: Wall motion abnormality possibly secondary to conduction abnormality.  If no changes, I anticipate pt can proceed with surgery as scheduled.   Willeen Cass, FNP-BC [facility]/Anesthesiology Phone: [phone] 10/09/2016 10:01 AM

## 2016-10-10 ENCOUNTER — Inpatient Hospital Stay (HOSPITAL_COMMUNITY): Payer: Medicare Other

## 2016-10-10 ENCOUNTER — Inpatient Hospital Stay (HOSPITAL_COMMUNITY)
Admission: RE | Disposition: A | Discharge status: Skilled Nursing Facility | Payer: Self-pay | Source: Ambulatory Visit | Attending: Neurosurgery

## 2016-10-10 ENCOUNTER — Inpatient Hospital Stay (HOSPITAL_COMMUNITY)
Admission: RE | Admit: 2016-10-10 | Discharge: 2016-10-13 | DRG: 472 | Disposition: A | Discharge status: Skilled Nursing Facility | Payer: Medicare Other | Source: Ambulatory Visit | Attending: Neurosurgery | Admitting: Neurosurgery

## 2016-10-10 ENCOUNTER — Inpatient Hospital Stay (HOSPITAL_COMMUNITY): Payer: Medicare Other | Admitting: Emergency Medicine

## 2016-10-10 ENCOUNTER — Encounter (HOSPITAL_COMMUNITY): Payer: Self-pay | Admitting: *Deleted

## 2016-10-10 DIAGNOSIS — F418 Other specified anxiety disorders: Secondary | ICD-10-CM | POA: Diagnosis present

## 2016-10-10 DIAGNOSIS — Z888 Allergy status to other drugs, medicaments and biological substances status: Secondary | ICD-10-CM

## 2016-10-10 DIAGNOSIS — M542 Cervicalgia: Secondary | ICD-10-CM | POA: Diagnosis not present

## 2016-10-10 DIAGNOSIS — K760 Fatty (change of) liver, not elsewhere classified: Secondary | ICD-10-CM | POA: Diagnosis present

## 2016-10-10 DIAGNOSIS — Z91041 Radiographic dye allergy status: Secondary | ICD-10-CM

## 2016-10-10 DIAGNOSIS — Z8249 Family history of ischemic heart disease and other diseases of the circulatory system: Secondary | ICD-10-CM

## 2016-10-10 DIAGNOSIS — Z7902 Long term (current) use of antithrombotics/antiplatelets: Secondary | ICD-10-CM | POA: Diagnosis not present

## 2016-10-10 DIAGNOSIS — Z955 Presence of coronary angioplasty implant and graft: Secondary | ICD-10-CM | POA: Diagnosis not present

## 2016-10-10 DIAGNOSIS — M4802 Spinal stenosis, cervical region: Secondary | ICD-10-CM | POA: Diagnosis present

## 2016-10-10 DIAGNOSIS — D860 Sarcoidosis of lung: Secondary | ICD-10-CM | POA: Diagnosis not present

## 2016-10-10 DIAGNOSIS — Z419 Encounter for procedure for purposes other than remedying health state, unspecified: Secondary | ICD-10-CM

## 2016-10-10 DIAGNOSIS — I255 Ischemic cardiomyopathy: Secondary | ICD-10-CM | POA: Diagnosis present

## 2016-10-10 DIAGNOSIS — Z7951 Long term (current) use of inhaled steroids: Secondary | ICD-10-CM | POA: Diagnosis not present

## 2016-10-10 DIAGNOSIS — M9921 Subluxation stenosis of neural canal of cervical region: Secondary | ICD-10-CM | POA: Diagnosis not present

## 2016-10-10 DIAGNOSIS — M2061 Acquired deformities of toe(s), unspecified, right foot: Secondary | ICD-10-CM | POA: Diagnosis not present

## 2016-10-10 DIAGNOSIS — E1129 Type 2 diabetes mellitus with other diabetic kidney complication: Secondary | ICD-10-CM | POA: Diagnosis not present

## 2016-10-10 DIAGNOSIS — Z881 Allergy status to other antibiotic agents status: Secondary | ICD-10-CM | POA: Diagnosis not present

## 2016-10-10 DIAGNOSIS — I2510 Atherosclerotic heart disease of native coronary artery without angina pectoris: Secondary | ICD-10-CM | POA: Diagnosis present

## 2016-10-10 DIAGNOSIS — E1151 Type 2 diabetes mellitus with diabetic peripheral angiopathy without gangrene: Secondary | ICD-10-CM | POA: Diagnosis present

## 2016-10-10 DIAGNOSIS — G4733 Obstructive sleep apnea (adult) (pediatric): Secondary | ICD-10-CM | POA: Diagnosis not present

## 2016-10-10 DIAGNOSIS — Z90710 Acquired absence of both cervix and uterus: Secondary | ICD-10-CM

## 2016-10-10 DIAGNOSIS — I5032 Chronic diastolic (congestive) heart failure: Secondary | ICD-10-CM | POA: Diagnosis present

## 2016-10-10 DIAGNOSIS — E1122 Type 2 diabetes mellitus with diabetic chronic kidney disease: Secondary | ICD-10-CM | POA: Diagnosis not present

## 2016-10-10 DIAGNOSIS — G959 Disease of spinal cord, unspecified: Secondary | ICD-10-CM | POA: Diagnosis not present

## 2016-10-10 DIAGNOSIS — J449 Chronic obstructive pulmonary disease, unspecified: Secondary | ICD-10-CM | POA: Diagnosis not present

## 2016-10-10 DIAGNOSIS — M50021 Cervical disc disorder at C4-C5 level with myelopathy: Principal | ICD-10-CM | POA: Diagnosis present

## 2016-10-10 DIAGNOSIS — N183 Chronic kidney disease, stage 3 (moderate): Secondary | ICD-10-CM | POA: Diagnosis not present

## 2016-10-10 DIAGNOSIS — I110 Hypertensive heart disease with heart failure: Secondary | ICD-10-CM | POA: Diagnosis not present

## 2016-10-10 DIAGNOSIS — Z4789 Encounter for other orthopedic aftercare: Secondary | ICD-10-CM | POA: Diagnosis not present

## 2016-10-10 DIAGNOSIS — M810 Age-related osteoporosis without current pathological fracture: Secondary | ICD-10-CM | POA: Diagnosis present

## 2016-10-10 DIAGNOSIS — G4730 Sleep apnea, unspecified: Secondary | ICD-10-CM | POA: Diagnosis present

## 2016-10-10 DIAGNOSIS — M6281 Muscle weakness (generalized): Secondary | ICD-10-CM | POA: Diagnosis not present

## 2016-10-10 DIAGNOSIS — Z9989 Dependence on other enabling machines and devices: Secondary | ICD-10-CM | POA: Diagnosis not present

## 2016-10-10 DIAGNOSIS — S199XXA Unspecified injury of neck, initial encounter: Secondary | ICD-10-CM | POA: Diagnosis not present

## 2016-10-10 DIAGNOSIS — E1140 Type 2 diabetes mellitus with diabetic neuropathy, unspecified: Secondary | ICD-10-CM | POA: Diagnosis not present

## 2016-10-10 DIAGNOSIS — Z9049 Acquired absence of other specified parts of digestive tract: Secondary | ICD-10-CM

## 2016-10-10 DIAGNOSIS — E785 Hyperlipidemia, unspecified: Secondary | ICD-10-CM | POA: Diagnosis present

## 2016-10-10 DIAGNOSIS — Z981 Arthrodesis status: Secondary | ICD-10-CM | POA: Diagnosis not present

## 2016-10-10 DIAGNOSIS — Z89421 Acquired absence of other right toe(s): Secondary | ICD-10-CM | POA: Diagnosis not present

## 2016-10-10 DIAGNOSIS — I129 Hypertensive chronic kidney disease with stage 1 through stage 4 chronic kidney disease, or unspecified chronic kidney disease: Secondary | ICD-10-CM | POA: Diagnosis not present

## 2016-10-10 DIAGNOSIS — I25110 Atherosclerotic heart disease of native coronary artery with unstable angina pectoris: Secondary | ICD-10-CM | POA: Diagnosis not present

## 2016-10-10 DIAGNOSIS — M5412 Radiculopathy, cervical region: Secondary | ICD-10-CM | POA: Diagnosis not present

## 2016-10-10 DIAGNOSIS — R262 Difficulty in walking, not elsewhere classified: Secondary | ICD-10-CM | POA: Diagnosis not present

## 2016-10-10 DIAGNOSIS — Z794 Long term (current) use of insulin: Secondary | ICD-10-CM | POA: Diagnosis not present

## 2016-10-10 DIAGNOSIS — Z79899 Other long term (current) drug therapy: Secondary | ICD-10-CM

## 2016-10-10 DIAGNOSIS — Z7982 Long term (current) use of aspirin: Secondary | ICD-10-CM | POA: Diagnosis not present

## 2016-10-10 DIAGNOSIS — M4322 Fusion of spine, cervical region: Secondary | ICD-10-CM | POA: Diagnosis not present

## 2016-10-10 DIAGNOSIS — E1165 Type 2 diabetes mellitus with hyperglycemia: Secondary | ICD-10-CM | POA: Diagnosis present

## 2016-10-10 DIAGNOSIS — Z8673 Personal history of transient ischemic attack (TIA), and cerebral infarction without residual deficits: Secondary | ICD-10-CM

## 2016-10-10 DIAGNOSIS — R41841 Cognitive communication deficit: Secondary | ICD-10-CM | POA: Diagnosis not present

## 2016-10-10 DIAGNOSIS — R1310 Dysphagia, unspecified: Secondary | ICD-10-CM | POA: Diagnosis not present

## 2016-10-10 HISTORY — PX: ANTERIOR CERVICAL DECOMP/DISCECTOMY FUSION: SHX1161

## 2016-10-10 HISTORY — DX: Anxiety disorder, unspecified: F41.9

## 2016-10-10 HISTORY — DX: Personal history of urinary calculi: Z87.442

## 2016-10-10 HISTORY — DX: Sleep apnea, unspecified: G47.30

## 2016-10-10 LAB — GLUCOSE, CAPILLARY
GLUCOSE-CAPILLARY: 206 mg/dL — AB (ref 65–99)
Glucose-Capillary: 174 mg/dL — ABNORMAL HIGH (ref 65–99)
Glucose-Capillary: 295 mg/dL — ABNORMAL HIGH (ref 65–99)
Glucose-Capillary: 262 mg/dL — ABNORMAL HIGH (ref 65–99)
Glucose-Capillary: 232 mg/dL — ABNORMAL HIGH (ref 65–99)

## 2016-10-10 SURGERY — ANTERIOR CERVICAL DECOMPRESSION/DISCECTOMY FUSION 3 LEVELS
Anesthesia: General

## 2016-10-10 MED ORDER — ONDANSETRON HCL 4 MG PO TABS: 4.0000 mg | ORAL_TABLET | Freq: Four times a day (QID) | ORAL | Status: DC | PRN

## 2016-10-10 MED ORDER — METOPROLOL TARTRATE 5 MG/5ML IV SOLN
INTRAVENOUS | Status: AC
  Filled 2016-10-10: qty 5

## 2016-10-10 MED ORDER — CHLORHEXIDINE GLUCONATE CLOTH 2 % EX PADS: 6.0000 | MEDICATED_PAD | Freq: Once | CUTANEOUS | Status: DC

## 2016-10-10 MED ORDER — SODIUM CHLORIDE 0.9 % IV SOLN: 250.0000 mL | INTRAVENOUS | Status: DC

## 2016-10-10 MED ORDER — LOSARTAN POTASSIUM 50 MG PO TABS
100.0000 mg | ORAL_TABLET | Freq: Every day | ORAL | Status: DC
  Administered 2016-10-10 – 2016-10-13 (×4): 100 mg via ORAL
  Filled 2016-10-10 (×4): qty 2

## 2016-10-10 MED ORDER — MIDAZOLAM HCL 2 MG/2ML IJ SOLN
INTRAMUSCULAR | Status: AC
  Filled 2016-10-10: qty 2

## 2016-10-10 MED ORDER — HYDROMORPHONE HCL 1 MG/ML IJ SOLN
0.5000 mg | INTRAMUSCULAR | Status: DC | PRN
  Administered 2016-10-10 – 2016-10-11 (×2): 0.5 mg via INTRAVENOUS
  Filled 2016-10-10 (×2): qty 0.5

## 2016-10-10 MED ORDER — PROPOFOL 10 MG/ML IV BOLUS
INTRAVENOUS | Status: DC | PRN
  Administered 2016-10-10: 200 mg via INTRAVENOUS
  Administered 2016-10-10: 40 mg via INTRAVENOUS

## 2016-10-10 MED ORDER — HYDROMORPHONE HCL 1 MG/ML IJ SOLN
INTRAMUSCULAR | Status: AC
  Filled 2016-10-10: qty 0.5

## 2016-10-10 MED ORDER — TICAGRELOR 90 MG PO TABS
90.0000 mg | ORAL_TABLET | Freq: Two times a day (BID) | ORAL | Status: DC
Start: 2016-10-11 — End: 2016-10-13
  Administered 2016-10-11 – 2016-10-13 (×4): 90 mg via ORAL
  Filled 2016-10-10 (×6): qty 1

## 2016-10-10 MED ORDER — PREGABALIN 50 MG PO CAPS
100.0000 mg | ORAL_CAPSULE | Freq: Two times a day (BID) | ORAL | Status: DC
  Administered 2016-10-10 – 2016-10-13 (×6): 100 mg via ORAL
  Filled 2016-10-10 (×6): qty 2

## 2016-10-10 MED ORDER — METOPROLOL TARTRATE 25 MG PO TABS
25.0000 mg | ORAL_TABLET | Freq: Once | ORAL | Status: AC
  Administered 2016-10-10: 25 mg via ORAL
  Filled 2016-10-10: qty 2
  Filled 2016-10-10: qty 1

## 2016-10-10 MED ORDER — METOPROLOL SUCCINATE ER 25 MG PO TB24
25.0000 mg | ORAL_TABLET | Freq: Every day | ORAL | Status: DC
  Administered 2016-10-11 – 2016-10-13 (×3): 25 mg via ORAL
  Filled 2016-10-10 (×4): qty 1

## 2016-10-10 MED ORDER — ALPRAZOLAM 0.5 MG PO TABS
0.5000 mg | ORAL_TABLET | Freq: Every day | ORAL | Status: DC
  Administered 2016-10-11 – 2016-10-13 (×3): 0.5 mg via ORAL
  Filled 2016-10-10 (×3): qty 1

## 2016-10-10 MED ORDER — NITROGLYCERIN 0.4 MG SL SUBL: 0.4000 mg | SUBLINGUAL_TABLET | SUBLINGUAL | Status: DC | PRN

## 2016-10-10 MED ORDER — SODIUM CHLORIDE 0.9% FLUSH
3.0000 mL | Freq: Two times a day (BID) | INTRAVENOUS | Status: DC
  Administered 2016-10-10 – 2016-10-13 (×7): 3 mL via INTRAVENOUS

## 2016-10-10 MED ORDER — THROMBIN 5000 UNITS EX SOLR
CUTANEOUS | Status: AC
  Filled 2016-10-10: qty 5000

## 2016-10-10 MED ORDER — ALBUTEROL SULFATE HFA 108 (90 BASE) MCG/ACT IN AERS: 2.0000 | INHALATION_SPRAY | Freq: Four times a day (QID) | RESPIRATORY_TRACT | Status: DC | PRN

## 2016-10-10 MED ORDER — THROMBIN 5000 UNITS EX SOLR
OROMUCOSAL | Status: DC | PRN
  Administered 2016-10-10 (×2): via TOPICAL

## 2016-10-10 MED ORDER — FLUTICASONE PROPIONATE 50 MCG/ACT NA SUSP
2.0000 | Freq: Every day | NASAL | Status: DC | PRN
  Filled 2016-10-10: qty 16

## 2016-10-10 MED ORDER — VANCOMYCIN HCL IN DEXTROSE 750-5 MG/150ML-% IV SOLN
750.0000 mg | Freq: Once | INTRAVENOUS | Status: AC
  Administered 2016-10-10: 750 mg via INTRAVENOUS
  Filled 2016-10-10: qty 150

## 2016-10-10 MED ORDER — INSULIN ASPART PROT & ASPART (70-30 MIX) 100 UNIT/ML ~~LOC~~ SUSP
44.0000 [IU] | Freq: Every day | SUBCUTANEOUS | Status: DC
  Administered 2016-10-12 – 2016-10-13 (×2): 44 [IU] via SUBCUTANEOUS
  Filled 2016-10-10 (×2): qty 10

## 2016-10-10 MED ORDER — ALBUTEROL SULFATE (2.5 MG/3ML) 0.083% IN NEBU: 2.5000 mg | INHALATION_SOLUTION | Freq: Four times a day (QID) | RESPIRATORY_TRACT | Status: DC | PRN

## 2016-10-10 MED ORDER — THROMBIN 20000 UNITS EX KIT
PACK | CUTANEOUS | Status: DC | PRN
  Administered 2016-10-10: 09:00:00 via TOPICAL

## 2016-10-10 MED ORDER — ONDANSETRON HCL 4 MG PO TABS: 4.0000 mg | ORAL_TABLET | Freq: Three times a day (TID) | ORAL | Status: DC | PRN

## 2016-10-10 MED ORDER — ROCURONIUM BROMIDE 10 MG/ML (PF) SYRINGE
PREFILLED_SYRINGE | INTRAVENOUS | Status: DC | PRN
  Administered 2016-10-10: 50 mg via INTRAVENOUS

## 2016-10-10 MED ORDER — FENTANYL CITRATE (PF) 250 MCG/5ML IJ SOLN
INTRAMUSCULAR | Status: AC
  Filled 2016-10-10: qty 5

## 2016-10-10 MED ORDER — METOPROLOL TARTRATE 5 MG/5ML IV SOLN
INTRAVENOUS | Status: DC | PRN
  Administered 2016-10-10 (×5): 1 mg via INTRAVENOUS

## 2016-10-10 MED ORDER — SODIUM CHLORIDE 0.9% FLUSH: 3.0000 mL | INTRAVENOUS | Status: DC | PRN

## 2016-10-10 MED ORDER — PROMETHAZINE HCL 25 MG/ML IJ SOLN: 6.2500 mg | INTRAMUSCULAR | Status: DC | PRN

## 2016-10-10 MED ORDER — ONDANSETRON HCL 4 MG/2ML IJ SOLN
INTRAMUSCULAR | Status: DC | PRN
  Administered 2016-10-10: 4 mg via INTRAVENOUS

## 2016-10-10 MED ORDER — INSULIN ASPART 100 UNIT/ML ~~LOC~~ SOLN
0.0000 [IU] | Freq: Three times a day (TID) | SUBCUTANEOUS | Status: DC
  Administered 2016-10-10: 11 [IU] via SUBCUTANEOUS
  Administered 2016-10-11 (×3): 7 [IU] via SUBCUTANEOUS
  Administered 2016-10-12 – 2016-10-13 (×3): 4 [IU] via SUBCUTANEOUS

## 2016-10-10 MED ORDER — HYDROMORPHONE HCL 1 MG/ML IJ SOLN
0.2500 mg | INTRAMUSCULAR | Status: DC | PRN
  Administered 2016-10-10: 0.5 mg via INTRAVENOUS
  Administered 2016-10-10 (×2): 0.25 mg via INTRAVENOUS

## 2016-10-10 MED ORDER — LIDOCAINE 2% (20 MG/ML) 5 ML SYRINGE
INTRAMUSCULAR | Status: AC
  Filled 2016-10-10: qty 5

## 2016-10-10 MED ORDER — HYDRALAZINE HCL 20 MG/ML IJ SOLN
INTRAMUSCULAR | Status: AC
  Filled 2016-10-10: qty 1

## 2016-10-10 MED ORDER — ONDANSETRON HCL 4 MG/2ML IJ SOLN
INTRAMUSCULAR | Status: AC
  Filled 2016-10-10: qty 2

## 2016-10-10 MED ORDER — FUROSEMIDE 20 MG PO TABS
20.0000 mg | ORAL_TABLET | ORAL | Status: DC
  Administered 2016-10-11 – 2016-10-13 (×2): 20 mg via ORAL
  Filled 2016-10-10 (×3): qty 1

## 2016-10-10 MED ORDER — ONDANSETRON HCL 4 MG/2ML IJ SOLN
4.0000 mg | Freq: Four times a day (QID) | INTRAMUSCULAR | Status: DC | PRN
  Administered 2016-10-10 – 2016-10-11 (×2): 4 mg via INTRAVENOUS
  Filled 2016-10-10 (×2): qty 2

## 2016-10-10 MED ORDER — INSULIN ASPART PROT & ASPART (70-30 MIX) 100 UNIT/ML ~~LOC~~ SUSP
60.0000 [IU] | Freq: Every day | SUBCUTANEOUS | Status: DC
  Administered 2016-10-11 – 2016-10-12 (×2): 60 [IU] via SUBCUTANEOUS

## 2016-10-10 MED ORDER — SUGAMMADEX SODIUM 200 MG/2ML IV SOLN
INTRAVENOUS | Status: AC
  Filled 2016-10-10: qty 2

## 2016-10-10 MED ORDER — PROPOFOL 10 MG/ML IV BOLUS
INTRAVENOUS | Status: AC
  Filled 2016-10-10: qty 40

## 2016-10-10 MED ORDER — ISOSORBIDE MONONITRATE ER 30 MG PO TB24
30.0000 mg | ORAL_TABLET | Freq: Every day | ORAL | Status: DC
  Administered 2016-10-10 – 2016-10-13 (×4): 30 mg via ORAL
  Filled 2016-10-10 (×5): qty 1

## 2016-10-10 MED ORDER — DOCUSATE SODIUM 100 MG PO CAPS
100.0000 mg | ORAL_CAPSULE | Freq: Two times a day (BID) | ORAL | Status: DC | PRN
  Administered 2016-10-12: 100 mg via ORAL
  Filled 2016-10-10: qty 1

## 2016-10-10 MED ORDER — PAROXETINE HCL 20 MG PO TABS
20.0000 mg | ORAL_TABLET | Freq: Every day | ORAL | Status: DC
  Administered 2016-10-10 – 2016-10-13 (×4): 20 mg via ORAL
  Filled 2016-10-10 (×5): qty 1

## 2016-10-10 MED ORDER — LIDOCAINE 2% (20 MG/ML) 5 ML SYRINGE
INTRAMUSCULAR | Status: DC | PRN
  Administered 2016-10-10: 100 mg via INTRAVENOUS

## 2016-10-10 MED ORDER — PHENOL 1.4 % MT LIQD
1.0000 | OROMUCOSAL | Status: DC | PRN
  Administered 2016-10-10: 1 via OROMUCOSAL
  Filled 2016-10-10 (×2): qty 177

## 2016-10-10 MED ORDER — MOMETASONE FURO-FORMOTEROL FUM 200-5 MCG/ACT IN AERO
2.0000 | INHALATION_SPRAY | Freq: Two times a day (BID) | RESPIRATORY_TRACT | Status: DC
  Administered 2016-10-12 – 2016-10-13 (×3): 2 via RESPIRATORY_TRACT
  Filled 2016-10-10 (×2): qty 8.8

## 2016-10-10 MED ORDER — 0.9 % SODIUM CHLORIDE (POUR BTL) OPTIME
TOPICAL | Status: DC | PRN
  Administered 2016-10-10: 1000 mL

## 2016-10-10 MED ORDER — CYCLOBENZAPRINE HCL 10 MG PO TABS
10.0000 mg | ORAL_TABLET | Freq: Three times a day (TID) | ORAL | Status: DC | PRN
  Administered 2016-10-11 (×2): 10 mg via ORAL
  Filled 2016-10-10 (×2): qty 1

## 2016-10-10 MED ORDER — MENTHOL 3 MG MT LOZG: 1.0000 | LOZENGE | OROMUCOSAL | Status: DC | PRN

## 2016-10-10 MED ORDER — SODIUM CHLORIDE 0.9 % IR SOLN
Status: DC | PRN
  Administered 2016-10-10: 09:00:00

## 2016-10-10 MED ORDER — FENTANYL CITRATE (PF) 100 MCG/2ML IJ SOLN
INTRAMUSCULAR | Status: DC | PRN
  Administered 2016-10-10: 50 ug via INTRAVENOUS
  Administered 2016-10-10: 75 ug via INTRAVENOUS
  Administered 2016-10-10: 25 ug via INTRAVENOUS
  Administered 2016-10-10: 50 ug via INTRAVENOUS
  Administered 2016-10-10: 100 ug via INTRAVENOUS
  Administered 2016-10-10: 50 ug via INTRAVENOUS

## 2016-10-10 MED ORDER — LACTATED RINGERS IV SOLN
INTRAVENOUS | Status: DC | PRN
  Administered 2016-10-10 (×2): via INTRAVENOUS

## 2016-10-10 MED ORDER — SUGAMMADEX SODIUM 200 MG/2ML IV SOLN
INTRAVENOUS | Status: DC | PRN
  Administered 2016-10-10: 173.6 mg via INTRAVENOUS

## 2016-10-10 MED ORDER — ROSUVASTATIN CALCIUM 5 MG PO TABS
20.0000 mg | ORAL_TABLET | Freq: Every day | ORAL | Status: DC
  Administered 2016-10-10 – 2016-10-13 (×4): 20 mg via ORAL
  Filled 2016-10-10 (×2): qty 4
  Filled 2016-10-10: qty 1
  Filled 2016-10-10: qty 4

## 2016-10-10 MED ORDER — HYDROCODONE-ACETAMINOPHEN 5-325 MG PO TABS
1.0000 | ORAL_TABLET | ORAL | Status: DC | PRN
  Administered 2016-10-11: 2 via ORAL
  Administered 2016-10-11 – 2016-10-12 (×5): 1 via ORAL
  Administered 2016-10-12: 2 via ORAL
  Administered 2016-10-12 – 2016-10-13 (×3): 1 via ORAL
  Filled 2016-10-10 (×4): qty 1
  Filled 2016-10-10: qty 2
  Filled 2016-10-10: qty 1
  Filled 2016-10-10: qty 2
  Filled 2016-10-10 (×3): qty 1

## 2016-10-10 MED ORDER — ROCURONIUM BROMIDE 10 MG/ML (PF) SYRINGE
PREFILLED_SYRINGE | INTRAVENOUS | Status: AC
  Filled 2016-10-10: qty 5

## 2016-10-10 MED ORDER — HYDRALAZINE HCL 20 MG/ML IJ SOLN
5.0000 mg | Freq: Once | INTRAMUSCULAR | Status: AC
  Administered 2016-10-10: 5 mg via INTRAVENOUS

## 2016-10-10 MED ORDER — THROMBIN 20000 UNITS EX SOLR
CUTANEOUS | Status: AC
  Filled 2016-10-10: qty 20000

## 2016-10-10 SURGICAL SUPPLY — 60 items
BAG DECANTER FOR FLEXI CONT (MISCELLANEOUS) ×2 IMPLANT
BENZOIN TINCTURE PRP APPL 2/3 (GAUZE/BANDAGES/DRESSINGS) ×2 IMPLANT
BIT DRILL 13 (BIT) ×2 IMPLANT
BUR MATCHSTICK NEURO 3.0 LAGG (BURR) ×2 IMPLANT
CAGE PEEK 6X14X11 (Cage) ×2 IMPLANT
CAGE PEEK 8X14X11 (Cage) ×2 IMPLANT
CAGE SPNL 11X14X6XRADOPQ (Cage) ×1 IMPLANT
CANISTER SUCT 3000ML PPV (MISCELLANEOUS) ×2 IMPLANT
CARTRIDGE OIL MAESTRO DRILL (MISCELLANEOUS) ×1 IMPLANT
DIFFUSER DRILL AIR PNEUMATIC (MISCELLANEOUS) ×2 IMPLANT
DRAPE C-ARM 42X72 X-RAY (DRAPES) ×4 IMPLANT
DRAPE LAPAROTOMY 100X72 PEDS (DRAPES) ×2 IMPLANT
DRAPE MICROSCOPE LEICA (MISCELLANEOUS) ×2 IMPLANT
DRAPE POUCH INSTRU U-SHP 10X18 (DRAPES) ×2 IMPLANT
DURAPREP 6ML APPLICATOR 50/CS (WOUND CARE) ×2 IMPLANT
ELECT COATED BLADE 2.86 ST (ELECTRODE) ×2 IMPLANT
ELECT REM PT RETURN 9FT ADLT (ELECTROSURGICAL) ×2
ELECTRODE REM PT RTRN 9FT ADLT (ELECTROSURGICAL) ×1 IMPLANT
GAUZE SPONGE 4X4 12PLY STRL (GAUZE/BANDAGES/DRESSINGS) ×2 IMPLANT
GAUZE SPONGE 4X4 16PLY XRAY LF (GAUZE/BANDAGES/DRESSINGS) IMPLANT
GLOVE BIOGEL PI IND STRL 7.5 (GLOVE) ×1 IMPLANT
GLOVE BIOGEL PI INDICATOR 7.5 (GLOVE) ×1
GLOVE ECLIPSE 9.0 STRL (GLOVE) ×2 IMPLANT
GLOVE EXAM NITRILE LRG STRL (GLOVE) IMPLANT
GLOVE EXAM NITRILE XL STR (GLOVE) IMPLANT
GLOVE EXAM NITRILE XS STR PU (GLOVE) IMPLANT
GLOVE SS BIOGEL STRL SZ 7.5 (GLOVE) ×1 IMPLANT
GLOVE SUPERSENSE BIOGEL SZ 7.5 (GLOVE) ×1
GOWN STRL REUS W/ TWL LRG LVL3 (GOWN DISPOSABLE) IMPLANT
GOWN STRL REUS W/ TWL XL LVL3 (GOWN DISPOSABLE) IMPLANT
GOWN STRL REUS W/TWL 2XL LVL3 (GOWN DISPOSABLE) IMPLANT
GOWN STRL REUS W/TWL LRG LVL3 (GOWN DISPOSABLE)
GOWN STRL REUS W/TWL XL LVL3 (GOWN DISPOSABLE)
HALTER HD/CHIN CERV TRACTION D (MISCELLANEOUS) ×2 IMPLANT
HEMOSTAT POWDER KIT SURGIFOAM (HEMOSTASIS) ×4 IMPLANT
KIT BASIN OR (CUSTOM PROCEDURE TRAY) ×2 IMPLANT
KIT ROOM TURNOVER OR (KITS) ×2 IMPLANT
NEEDLE SPNL 20GX3.5 QUINCKE YW (NEEDLE) ×2 IMPLANT
NS IRRIG 1000ML POUR BTL (IV SOLUTION) ×2 IMPLANT
OIL CARTRIDGE MAESTRO DRILL (MISCELLANEOUS) ×2
PACK LAMINECTOMY NEURO (CUSTOM PROCEDURE TRAY) ×2 IMPLANT
PAD ARMBOARD 7.5X6 YLW CONV (MISCELLANEOUS) ×6 IMPLANT
PATTIES SURGICAL .5 X.5 (GAUZE/BANDAGES/DRESSINGS) ×2 IMPLANT
PLATE 3 57.5XLCK NS SPNE CVD (Plate) ×1 IMPLANT
PLATE 3 ATLANTIS TRANS (Plate) ×1 IMPLANT
RUBBERBAND STERILE (MISCELLANEOUS) ×4 IMPLANT
SCREW ST FIX 4 ATL 3120213 (Screw) ×16 IMPLANT
SPACER SPNL 11X14X6XPEEK CVD (Cage) ×1 IMPLANT
SPCR SPNL 11X14X6XPEEK CVD (Cage) ×1 IMPLANT
SPONGE INTESTINAL PEANUT (DISPOSABLE) ×2 IMPLANT
SPONGE SURGIFOAM ABS GEL 100 (HEMOSTASIS) ×2 IMPLANT
STRIP CLOSURE SKIN 1/2X4 (GAUZE/BANDAGES/DRESSINGS) ×2 IMPLANT
SUT VIC AB 3-0 SH 8-18 (SUTURE) ×2 IMPLANT
SUT VIC AB 4-0 RB1 18 (SUTURE) ×2 IMPLANT
TAPE CLOTH 4X10 WHT NS (GAUZE/BANDAGES/DRESSINGS) ×2 IMPLANT
TAPE CLOTH SURG 4X10 WHT LF (GAUZE/BANDAGES/DRESSINGS) ×2 IMPLANT
TOWEL GREEN STERILE (TOWEL DISPOSABLE) ×2 IMPLANT
TOWEL GREEN STERILE FF (TOWEL DISPOSABLE) ×2 IMPLANT
TRAP SPECIMEN MUCOUS 40CC (MISCELLANEOUS) ×2 IMPLANT
WATER STERILE IRR 1000ML POUR (IV SOLUTION) ×2 IMPLANT

## 2016-10-10 NOTE — Evaluation (Signed)
Physical Therapy Evaluation Patient Details [name] MRN: [id number] DOB: July 20, 1940 Today's Date: 10/10/2016   History of Present Illness  Pt is s/p C4-7 ACDF. 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   Clinical Impression  Patient is s/p above surgery resulting in the deficits listed below (see PT Problem List). PTA, pt was using RW and cane to ambulate, however, pt's husband reports pt has history of falls. Upon evaluation, pt with increased pain, nausea/vomiting, weakness, and VERY unsteady. Required mod A +2 for balance during ambulation. Pt's husband about return home. Given current deficits feel pt would benefit from SNF to increase functional mobility independence. Patient will benefit from skilled PT to increase their independence and safety with mobility (while adhering to their precautions) to allow discharge to the venue listed below. Will continue to follow acutely.      Follow Up Recommendations SNF    Equipment Recommendations  None recommended by PT    Recommendations for Other Services       Precautions / Restrictions Precautions Precautions: Fall;Cervical Precaution Comments: Reviewed cervical precautions with pt and family.  Required Braces or Orthoses: Cervical Brace Cervical Brace: Soft collar Restrictions Weight Bearing Restrictions: No      Mobility  Bed Mobility Overal bed mobility: Needs Assistance Bed Mobility: Rolling;Supine to Sit;Sit to Sidelying Rolling: Mod assist   Supine to sit: Mod assist   Sit to sidelying: Mod assist;+2 for physical assistance General bed mobility comments: Mod A for trunk elevation and rolling. Verbal cues for log roll technique. Mod A +2 for sit>supine for LE management and to ensure appropriate log roll technique.   Transfers Overall transfer level: Needs assistance Equipment used: Rolling walker (2  wheeled) Transfers: Sit to/from Stand Sit to Stand: Mod assist         General transfer comment: Mod A for lift assist and steadying upon standing. Pt demonstrating shakiness in BUE.   Ambulation/Gait Ambulation/Gait assistance: Mod assist;+2 physical assistance Ambulation Distance (Feet): 15 Feet Assistive device: Rolling walker (2 wheeled) Gait Pattern/deviations: Decreased stride length;Step-to pattern;Shuffle;Staggering right;Staggering left;Trunk flexed Gait velocity: Decreased Gait velocity interpretation: Below normal speed for age/gender General Gait Details: Slow, very unsteady gait. Pt with shuffling gait, and requiring mod A +2 for steadying for ambulation to bathroom. Verbal cues to increase step length, however, pt unable. Husband concerned about unsteadiness and return home because he is unable to physically assist.   Stairs            Wheelchair Mobility    Modified Rankin (Stroke Patients Only)       Balance Overall balance assessment: Needs assistance Sitting-balance support: Bilateral upper extremity supported;Feet supported Sitting balance-Leahy Scale: Poor Sitting balance - Comments: Reliant on BUE support to maintain balance    Standing balance support: Bilateral upper extremity supported;During functional activity Standing balance-Leahy Scale: Poor Standing balance comment: Reliant on RW for support.                              Pertinent Vitals/Pain Pain Assessment: Faces Faces Pain Scale: Hurts even more Pain Location: neck  Pain Descriptors / Indicators: Sore;Operative site guarding Pain Intervention(s): Limited activity within patient's tolerance;Monitored during session;Repositioned    Home Living Family/patient expects to be discharged to:: Private residence Living Arrangements: Spouse/significant other Available Help at Discharge: Family;Available 24 hours/day (but not able to provide physical assist )  Type of Home: House  (split level ) Home Access: Level entry     Home Layout: Multi-level (split level ) Home Equipment: Walker - 2 wheels;Cane - single point      Prior Function Level of Independence: Independent with assistive device(s)         Comments: Pt reports she used cane and walker to walk at home. Pt's husband reports history of falls, the last being about 5 weeks ago.      Hand Dominance        Extremity/Trunk Assessment   Upper Extremity Assessment Upper Extremity Assessment: Defer to OT evaluation    Lower Extremity Assessment Lower Extremity Assessment: Generalized weakness (grossly 3/5 throughout )    Cervical / Trunk Assessment Cervical / Trunk Assessment: Other exceptions Cervical / Trunk Exceptions: s/p surgery   Communication   Communication: No difficulties  Cognition Arousal/Alertness: Awake/alert Behavior During Therapy: Flat affect Overall Cognitive Status: Within Functional Limits for tasks assessed                                 General Comments: Pt with decreased awareness of deficits, however, husband reports pt is at baseline.       General Comments General comments (skin integrity, edema, etc.): Pt with nausea/vomiting during session; RN present in room and aware. Discussed SNF with family given current deficits and family and pt agreeable.     Exercises     Assessment/Plan    PT Assessment Patient needs continued PT services  PT Problem List Decreased strength;Decreased activity tolerance;Decreased balance;Decreased mobility;Decreased coordination;Decreased knowledge of use of DME;Decreased safety awareness;Decreased knowledge of precautions;Pain       PT Treatment Interventions DME instruction;Gait training;Functional mobility training;Therapeutic activities;Therapeutic exercise;Balance training;Neuromuscular re-education;Patient/family education    PT Goals (Current goals can be found in the Care Plan section)  Acute Rehab PT  Goals Patient Stated Goal: to feel better  PT Goal Formulation: With patient Time For Goal Achievement: 10/24/16 Potential to Achieve Goals: Fair    Frequency 7X/week   Barriers to discharge Other (comment) Pt's husband unable to provide physical support.     Co-evaluation               AM-PAC PT "6 Clicks" Daily Activity  Outcome Measure Difficulty turning over in bed (including adjusting bedclothes, sheets and blankets)?: Total Difficulty moving from lying on back to sitting on the side of the bed? : Total Difficulty sitting down on and standing up from a chair with arms (e.g., wheelchair, bedside commode, etc,.)?: Total Help needed moving to and from a bed to chair (including a wheelchair)?: A Lot Help needed walking in hospital room?: A Lot Help needed climbing 3-5 steps with a railing? : Total 6 Click Score: 8    End of Session Equipment Utilized During Treatment: Gait belt;Cervical collar Activity Tolerance: Treatment limited secondary to medical complications (Comment) (nausea/vomiting ) Patient left: in bed;with call bell/phone within reach;with family/visitor present Nurse Communication: Mobility status PT Visit Diagnosis: Unsteadiness on feet (R26.81);Repeated falls (R29.6);Pain Pain - part of body:  (neck )    Time: 1740-8144 PT Time Calculation (min) (ACUTE ONLY): 24 min   Charges:   PT Evaluation $PT Eval Moderate Complexity: 1 Procedure PT Treatments $Gait Training: 8-22 mins   PT G Codes:        Nicky Pugh, PT, DPT  Acute Rehabilitation Services  Pager: [phone]   Army Melia 10/10/2016, 5:06  PM   

## 2016-10-10 NOTE — Anesthesia Postprocedure Evaluation (Signed)
Anesthesia Post Note  Patient: NIMRAT WOOLWORTH  Procedure(s) Performed: Procedure(s) (LRB): Anterior Cervical Discectomy Fusion - Cervical four4- five - Cervical five-Cervical six - Cervical six-Cervical seven (N/A)     Patient location during evaluation: PACU Anesthesia Type: General Level of consciousness: sedated and oriented Pain management: pain level controlled Vital Signs Assessment: post-procedure vital signs reviewed and stable Respiratory status: spontaneous breathing, nonlabored ventilation, respiratory function stable and patient connected to nasal cannula oxygen Cardiovascular status: blood pressure returned to baseline and stable Postop Assessment: no signs of nausea or vomiting Anesthetic complications: no    Last Vitals:  Vitals:   10/10/16 1237 10/10/16 1325  BP:  (!) 191/58  Pulse: 64 68  Resp: 14 16  Temp:  36.8 C    Last Pain:  Vitals:   10/10/16 1325  TempSrc: Oral  PainSc:                  Arijana Narayan,JAMES TERRILL

## 2016-10-10 NOTE — Anesthesia Preprocedure Evaluation (Addendum)
Anesthesia Evaluation  Patient identified by MRN, date of birth, ID band  Reviewed: Allergy & Precautions, NPO status   Airway Mallampati: II   Neck ROM: Limited    Dental  (+) Edentulous Upper   Pulmonary shortness of breath, sleep apnea , COPD,    breath sounds clear to auscultation + decreased breath sounds      Cardiovascular hypertension, + CAD, + Peripheral Vascular Disease and +CHF   Rhythm:Regular Rate:Normal     Neuro/Psych  Neuromuscular disease    GI/Hepatic   Endo/Other  diabetes  Renal/GU Renal disease     Musculoskeletal   Abdominal   Peds  Hematology   Anesthesia Other Findings   Reproductive/Obstetrics                             Anesthesia Physical Anesthesia Plan  ASA: IV  Anesthesia Plan: General   Post-op Pain Management:    Induction: Intravenous  Airway Management Planned: Oral ETT  Additional Equipment: Arterial line  Intra-op Plan: Utilization Of Total Body Hypothermia per surgeon request  Post-operative Plan: Possible Post-op intubation/ventilation and Extubation in OR  Informed Consent: I have reviewed the patients History and Physical, chart, labs and discussed the procedure including the risks, benefits and alternatives for the proposed anesthesia with the patient or authorized representative who has indicated his/her understanding and acceptance.     Plan Discussed with:   Anesthesia Plan Comments: (Deconditioned , SOB, multiple stents, moderate to higher risk)      Anesthesia Quick Evaluation

## 2016-10-10 NOTE — Transfer of Care (Signed)
Immediate Anesthesia Transfer of Care Note  Patient: Pamela Collier  Procedure(s) Performed: Procedure(s): Anterior Cervical Discectomy Fusion - Cervical four4- five - Cervical five-Cervical six - Cervical six-Cervical seven (N/A)  Patient Location: PACU  Anesthesia Type:General  Level of Consciousness: awake, alert , oriented and patient cooperative  Airway & Oxygen Therapy: Patient Spontanous Breathing and Patient connected to face mask oxygen  Post-op Assessment: Report given to RN, Post -op Vital signs reviewed and stable and Patient moving all extremities X 4  Post vital signs: Reviewed and stable  Last Vitals:  Vitals:   10/10/16 0621 10/10/16 0642  BP: (!) 166/46   Pulse: 60   Resp: 18   Temp:  36.9 C    Last Pain:  Vitals:   10/10/16 0617  PainSc: 8       Patients Stated Pain Goal: 2 (57/97/28 2060)  Complications: No apparent anesthesia complications

## 2016-10-10 NOTE — Progress Notes (Signed)
Pharmacy Antibiotic Note  Pamela Collier is a 76 y.o. female admitted on 10/10/2016 with neck pain, now s/p discectomy with fusion.  Pharmacy has been consulted for vancomycin dosing for surgical prophylaxis.  Patient does not have a drain.  Baseline labs reviewed.  Noted patient received vancomycin 1gm IV around 0800 today.    Plan: - Vanc 750mg  IV x 1 at [place] will sign off.     Temp (24hrs), Avg:98.3 F (36.8 C), Min:98.2 F (36.8 C), Max:98.5 F (36.9 C)   Recent Labs Lab 10/08/16 1546  WBC 7.0  CREATININE 1.25*    Estimated Creatinine Clearance: 43.3 mL/min (A) (by C-G formula based on SCr of 1.25 mg/dL (H)).    Allergies  Allergen Reactions  . Cephalexin Itching  . Contrast Media [Iodinated Diagnostic Agents] Itching  . Doxycycline Itching  . Gabapentin Itching  . Sulfa Antibiotics Itching      Giuliana Handyside D. Mina Marble, PharmD, BCPS Pager:  [phone] 10/10/2016, 1:31 PM

## 2016-10-10 NOTE — Anesthesia Procedure Notes (Signed)
Procedure Name: Intubation Date/Time: 10/10/2016 8:21 AM Performed by: Everlean Cherry A Pre-anesthesia Checklist: Patient identified, Emergency Drugs available, Suction available and Patient being monitored Patient Re-evaluated:Patient Re-evaluated prior to inductionOxygen Delivery Method: Circle system utilized Preoxygenation: Pre-oxygenation with 100% oxygen Intubation Type: IV induction Ventilation: Mask ventilation without difficulty and Oral airway inserted - appropriate to patient size Laryngoscope Size: Glidescope and 4 Grade View: Grade I Tube type: Oral Tube size: 7.0 mm Number of attempts: 1 Airway Equipment and Method: Video-laryngoscopy and Rigid stylet Placement Confirmation: positive ETCO2,  breath sounds checked- equal and bilateral and ETT inserted through vocal cords under direct vision Secured at: 20 cm Tube secured with: Tape Dental Injury: Teeth and Oropharynx as per pre-operative assessment  Difficulty Due To: Difficulty was anticipated and Difficult Airway- due to reduced neck mobility Comments: Elective glidescope intubation due to neck pain.

## 2016-10-10 NOTE — Progress Notes (Signed)
Patient did not take blood pressure medication this am, notified Dr. Orene Desanctis. Medication given per MD order.

## 2016-10-10 NOTE — Op Note (Signed)
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  Asst. Surgeon: Ditty  Anesthesia: General  Indication: 76 year old female with multiple medical problems presents with neck and bilateral upper extremity symptoms consistent with an early cervical myelopathy. Workup demonstrates evidence of marked multilevel disc degeneration with associated disc bulging and stenosis with resultant spinal cord compression. Patient presents now for three-level anterior cervical decompression and fusion in hopes of improving her symptoms.  Operative note: After induction of anesthesia, patient positioned supine with neck slightly extended and held in place of halter traction. Patient's anterior cervical region prepped and draped sterilely. Incision made overlying C5-6. Dissection performed the right. Retractor placed. Fluoroscopy used. Levels confirmed. Disc spaces at all 3 levels in size. Discectomies performed using various instruments down to level posterior annulus. Microscope was then brought into field used throughout the remainder of the discectomy. Remaining aspects of annulus and osteophytes removed using high-speed drill down to level posterior longitudinal ligament. Posterior lateral ligament was then elevated and resected in piecemeal fashion. Underlying thecal sac was identified. Wide central decompression was then performed by undercutting the bodies of C4 and C5. Decompression then proceeded into each neural foramen. Wide anterior foraminotomies were performed on the course exiting C5 nerve roots bilaterally. Procedure then repeated at C5-6 and C6-7 again without  complications. Morselized autograft was packed inside of [employer] anatomic peek cages. Cages were impacted each level and recessed slightly from the anterior cortical margin Atlantis anterior cervical transitional plate was then placed over the C4-C7 level. This an attachment or fluoroscopic guidance heme 13 mm fixed angle screws to reach it all 4 levels. All 8 screws given a final tightening and found to be solidly within bone. Locking screws were engaged. Final images revealed good position of the cages and hardware at proper upper level with normal alignment is spine. Wounds and irrigated one final time. Hemostasis was assured with bipolar cautery. Wounds and close in layers of Vicryl sutures. Steri-Strips sterile dressing were applied. No apparent complications. Patient tolerated procedure well and she returns to the recovery room postop.

## 2016-10-10 NOTE — Progress Notes (Signed)
Orthopedic Tech Progress Note Patient Details:  MICHAILA KENNEY [birth year] [id number]  Ortho Devices Type of Ortho Device: Soft collar Ortho Device/Splint Location: applied soft cervical collar (Cervical Soft Collar) to pt neck.  Floor nurse assisted with application.  pt tolerated application well.  Ortho Device/Splint Interventions: Application, Adjustment   Kristopher Oppenheim 10/10/2016, 11:36 AM

## 2016-10-10 NOTE — Progress Notes (Signed)
Patient did not take blood pressure medication, notified Dr. Therisa Doyne, ordered to retake blood pressure and pass on to Dr. Orene Desanctis on arrival.  Made CRNA aware as well @0655  and left phone number to call when Dr. Orene Desanctis arrived.

## 2016-10-10 NOTE — Brief Op Note (Signed)
10/10/2016  10:53 AM  PATIENT:  Pamela Collier  76 y.o. female  PRE-OPERATIVE DIAGNOSIS:  Stenosis  POST-OPERATIVE DIAGNOSIS:  Stenosis  PROCEDURE:  Procedure(s): Anterior Cervical Discectomy Fusion - Cervical four4- five - Cervical five-Cervical six - Cervical six-Cervical seven (N/A)  SURGEON:  Surgeon(s) and Role:    * Earnie Larsson, MD - Primary    * Ditty, Kevan Ny, MD - Assisting  PHYSICIAN ASSISTANT:   ASSISTANTS:    ANESTHESIA:   general  EBL:  Total I/O In: 1000 [I.V.:1000] Out: 350 [Blood:350]  BLOOD ADMINISTERED:none  DRAINS: none   LOCAL MEDICATIONS USED:  NONE  SPECIMEN:  No Specimen  DISPOSITION OF SPECIMEN:  N/A  COUNTS:  YES  TOURNIQUET:  * No tourniquets in log *  DICTATION: .Dragon Dictation  PLAN OF CARE: Admit to inpatient   PATIENT DISPOSITION:  PACU - hemodynamically stable.   Delay start of Pharmacological VTE agent (>24hrs) due to surgical blood loss or risk of bleeding: yes

## 2016-10-10 NOTE — H&P (Signed)
Pamela Collier is an 76 y.o. female.   Chief Complaint: Neck pain HPI: 76 year old female with neck and bilateral upper extremity symptoms failing conservative management. Workup demonstrates evidence of marked cervical disc disease with associated spondylosis and stenosis at C4-5, C5-6 and C6-7. Patient presents now for three-level anterior cervical decompression infusion in hopes of improving her symptoms.  Past Medical History:  Diagnosis Date  . Acute respiratory failure ([place]) 11/21/2014  . Anxiety   . Arthritis 08/05/2013  . Atherosclerotic peripheral vascular disease with gangrene [facility]) august 2012  . Cardiomyopathy, ischemic    a. EF 35 to 40% by echo in 2013 b. EF improved to 50-55% by echo in 04/2015.  [name] Cerebral infarct ([place]) 08/17/2013  . Chronic diastolic CHF (congestive heart failure) (HCC)    a. EF 50-55% by echo in 04/2015  . COPD (chronic obstructive pulmonary disease) ([place])   . Coronary artery disease, occlusive    a. Previous PCI to the LAD, LCx, and RCA in 2010, 2011, 2013, and 2016. All performed in [place].  . Depression   . 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 Diabetic diarrhea ([place]) 10/03/2014  . DM type 2, uncontrolled, with renal complications ([place]) 09/15/4330  . Hepatic steatosis    by CT abd pelvis  . History of kidney stones    at a younger age  . Hyperlipidemia LDL goal <100 02/23/2014  . Hypertensive heart disease   . Osteoporosis, post-menopausal   . 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 Pleural effusion 10/25/2012   10/2012 CT chest >> small to moderate R lung effusion>> chylothorax, 100% lymphs 10/2013 thoracentesis> cytology negative, WBC 1471, > 90% "small lymphs" 01/2014 CT chest> near complete resolution of pleural effusion, stable lymphadenopathy 08/2014 CT chest [place] ([facility]): small right sided effusion decreased in size, stable mediastinal  lymphadenopathy 1.0cm largest, 62m  . 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 Renal insufficiency   . Sarcoidosis   . Sleep apnea     Past Surgical History:  Procedure Laterality Date  . ABDOMINAL HYSTERECTOMY     at ge 39. secondary to bleeding/partial  . BREAST CYST ASPIRATION Right   . CARPAL TUNNEL RELEASE Bilateral   . CHOLECYSTECTOMY     in [place]   . COLONOSCOPY WITH PROPOFOL N/A 01/09/2015   Procedure: COLONOSCOPY WITH PROPOFOL;  Surgeon: Lucilla Lame, MD;  Location: [facility] ENDOSCOPY;  Service: Endoscopy;  Laterality: N/A;  . CORONARY ANGIOPLASTY WITH STENT PLACEMENT     [place]; [facility]  . EYE SURGERY     bil cataracts  . HERNIA REPAIR     umbilical/ Dr Pat Patrick  . PTCA  August 2012   Right Posterior tibial artery , [place]  . TOE AMPUTATION  Sept 2012   Right 2nd toe, [place]    Family History  Problem Relation Age of Onset  . Heart disease Father   . Heart attack Father   . Heart disease Sister   . Heart attack Sister   . Heart disease Brother   . Heart attack Brother   . Asthma Grandchild   . Breast cancer  Maternal Aunt    Social History:  reports that she has never smoked. She has never used smokeless tobacco. She reports that she does not drink alcohol or use drugs.  Allergies:  Allergies  Allergen Reactions  . Cephalexin Itching  . Contrast Media [Iodinated Diagnostic Agents] Itching  . Doxycycline Itching  . Gabapentin Itching  . Sulfa Antibiotics Itching    Medications Prior to Admission  Medication Sig Dispense Refill  . acetaminophen (TYLENOL) 500 MG tablet Take 1,000 mg by mouth every 6 (six) hours as needed for mild pain or headache.     . albuterol (PROAIR HFA) 108 (90 Base) MCG/ACT inhaler Inhale 2  puffs into the lungs every 6 (six) hours as needed for wheezing or shortness of breath. 1 Inhaler 2  . albuterol (PROVENTIL) (2.5 MG/3ML) 0.083% nebulizer solution Take 3 mLs (2.5 mg total) by nebulization every 6 (six) hours as needed for wheezing or shortness of breath. 150 mL 1  . alendronate (FOSAMAX) 70 MG tablet Take 1 tablet (70 mg total) by mouth once a week. Pt takes on Sunday.   Take with a full glass of water on an empty stomach. 4 tablet 11  . ALPRAZolam (XANAX) 0.5 MG tablet Take 0.5 mg by mouth daily.    [name] aspirin EC 81 MG tablet Take 81 mg by mouth daily.    [name] BRILINTA 90 MG TABS tablet TAKE 1 TABLET TWICE A DAY 180 tablet 1  . docusate sodium (COLACE) 100 MG capsule Take 100 mg by mouth 2 (two) times daily as needed for mild constipation. Reported on 11/09/2015    . fluticasone (FLONASE) 50 MCG/ACT nasal spray Place 2 sprays into both nostrils daily as needed for rhinitis.    . furosemide (LASIX) 20 MG tablet Take 1 tablet (20 mg total) by mouth daily. (Patient taking differently: Take 20 mg by mouth every other day. ) 90 tablet 1  . Insulin Lispro Prot & Lispro (HUMALOG MIX 75/25 KWIKPEN) (75-25) 100 UNIT/ML Kwikpen Inject 45 to 60 units twice daily before morning and evening meals (Patient taking differently: Inject 44 units in the morning and 60 units in the evening) 112 mL 3  . isosorbide mononitrate (IMDUR) 30 MG 24 hr tablet TAKE 1 TABLET DAILY 90 tablet 1  . losartan (COZAAR) 100 MG tablet TAKE 1 TABLET DAILY 90 tablet 0  . metoprolol succinate (TOPROL-XL) 25 MG 24 hr tablet Take 1 tablet (25 mg total) by mouth daily. 90 tablet 3  . nitroGLYCERIN (NITROSTAT) 0.4 MG SL tablet Place 1 tablet (0.4 mg total) under the tongue every 5 (five) minutes as needed for chest pain. 25 tablet 3  . PARoxetine (PAXIL) 20 MG tablet TAKE ONE TABLET BY MOUTH ONCE DAILY 90 tablet 1  . polyethylene glycol (MIRALAX / GLYCOLAX) packet Take 17 g by mouth daily as needed for mild constipation. Reported  on 10/25/2015    . pregabalin (LYRICA) 100 MG capsule Take 1 capsule (100 mg total) by mouth 3 (three) times daily. (Patient taking differently: Take 100 mg by mouth 2 (two) times daily. ) 270 capsule 2  . rosuvastatin (CRESTOR) 20 MG tablet Take 20 mg by mouth daily.     . B-D UF III MINI PEN NEEDLES 31G X 5 MM MISC USE THREE TIMES A DAY 270 each 3  . budesonide-formoterol (SYMBICORT) 160-4.5 MCG/ACT inhaler Inhale 2 puffs into the lungs 2 (two) times daily. (Patient not taking: Reported on 09/19/2016) 1 Inhaler 0  . diazepam (VALIUM) 5  MG tablet One tablet one hour before the MRI.  May repeat once if needed (Patient not taking: Reported on 09/19/2016) 2 tablet 0  . glucose blood (ACCU-CHEK AVIVA PLUS) test strip USE TO CHECK GLUCOSE  THREE TIMES DAILY 300 each 3  . ondansetron (ZOFRAN) 4 MG tablet Take 1 tablet (4 mg total) by mouth every 8 (eight) hours as needed for nausea or vomiting. (Patient not taking: Reported on 09/19/2016) 20 tablet 0    Results for orders placed or performed during the hospital encounter of 10/10/16 (from the past 48 hour(s))  Glucose, capillary     Status: Abnormal   Collection Time: 10/10/16  6:42 AM  Result Value Ref Range   Glucose-Capillary 174 (H) 65 - 99 mg/dL   No results found.  Pertinent items noted in HPI and remainder of comprehensive ROS otherwise negative.  Blood pressure (!) 166/46, pulse 60, temperature 98.5 F (36.9 C), resp. rate 18.  Patient is awake and alert. She is oriented and appropriate. Her speech is fluent. Judgment and insight are intact. Cranial nerve function normal bilaterally. Motor examination with some mild grip and intrinsic weakness bilaterally. Sensory examination with patchy distal sensory loss in both upper and lower extremities. Reflexes normal active. No evidence of long track signs. Gait moderate spastic. Examination head ears eyes nose and throat is unremarkable. Chest and abdomen are benign. Extremities are free from injury  or deformity. Assessment/Plan Cervical stenosis with early cervical myelopathy. Plan C4-5, C5-6, C6-7 anterior cervical discectomy and fusion with interbody peek cages, locally harvested autograft, and anterior plate instrumentation. Risks and benefits of been explained. Patient wishes to proceed.  Kiing Deakin A 10/10/2016, 7:53 AM

## 2016-10-11 LAB — GLUCOSE, CAPILLARY
GLUCOSE-CAPILLARY: 246 mg/dL — AB (ref 65–99)
GLUCOSE-CAPILLARY: 248 mg/dL — AB (ref 65–99)
GLUCOSE-CAPILLARY: 138 mg/dL — AB (ref 65–99)
Glucose-Capillary: 216 mg/dL — ABNORMAL HIGH (ref 65–99)

## 2016-10-11 NOTE — Progress Notes (Signed)
Patient ID: Pamela Collier, female   DOB: Jul 08, 1940, 76 y.o.   MRN: [id number] Subjective:  The patient is alert and pleasant. She complains of nausea. She is not ready to go home.  Objective: Vital signs in last 24 hours: Temp:  [98.2 F (36.8 C)-99.2 F (37.3 C)] 98.6 F (37 C) (06/02 0520) Pulse Rate:  [63-83] 74 (06/02 0520) Resp:  [7-20] 18 (06/02 0520) BP: (139-204)/(48-95) 183/62 (06/02 0520) SpO2:  [90 %-100 %] 95 % (06/02 0520) Arterial Line BP: (183-218)/(58-147) 190/64 (06/01 1237) Weight:  [90 kg (198 lb 6.6 oz)] 90 kg (198 lb 6.6 oz) (06/02 0520)  Intake/Output from previous day: 06/01 0701 - 06/02 0700 In: 1500 [I.V.:1500] Out: 350 [Blood:350] Intake/Output this shift: No intake/output data recorded.  Physical exam the patient is alert and oriented. Her strength is grossly normal. Her dressing is clean and dry. There is no hematoma or shift.  Lab Results:  Recent Labs  10/08/16 1546  WBC 7.0  HGB 11.7*  HCT 36.8  PLT 286   BMET  Recent Labs  10/08/16 1546  NA 136  K 4.5  CL 103  CO2 27  GLUCOSE 132*  BUN 25*  CREATININE 1.25*  CALCIUM 9.9    Studies/Results: Dg Cervical Spine 1 View  Result Date: 10/10/2016 CLINICAL DATA:  Anterior cervical fusion. EXAM: CERVICAL SPINE 1 VIEW FLUOROSCOPY TIME:  10 seconds. COMPARISON:  Radiographs of July 01, 2016. FINDINGS: Two intraoperative fluoroscopic images demonstrate the patient be status post surgical anterior fusion extending from C4-C7. Good alignment of vertebral bodies is noted. IMPRESSION: Status post surgical anterior fusion from C4-C7 Electronically Signed   By: Marijo Conception, M.D.   On: 10/10/2016 12:28   Dg C-arm 1-60 Min  Result Date: 10/10/2016 CLINICAL DATA:  Anterior cervical fusion. EXAM: CERVICAL SPINE 1 VIEW FLUOROSCOPY TIME:  10 seconds. COMPARISON:  Radiographs of July 01, 2016. FINDINGS: Two intraoperative fluoroscopic images demonstrate the patient be status post surgical  anterior fusion extending from C4-C7. Good alignment of vertebral bodies is noted. IMPRESSION: Status post surgical anterior fusion from C4-C7 Electronically Signed   By: Marijo Conception, M.D.   On: 10/10/2016 12:28    Assessment/Plan: Postop day #1: She may be ready to go home tomorrow.  LOS: 1 day     Krishna Dancel D 10/11/2016, 6:38 AM

## 2016-10-11 NOTE — Progress Notes (Signed)
Pt transferred to 5C04. Report given to receiving nurse. Pt was notified before leaving the unit. No complains noted or verbalized by pt. Family will be notified.Transpoting was done by RN and tech.

## 2016-10-11 NOTE — Progress Notes (Signed)
Physical Therapy Treatment Patient Details [name] MRN: [id number] DOB: May 18, 1940 Today's Date: 10/11/2016    History of Present Illness Pt is s/p C4-7 ACDF. 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     PT Comments    Pt demonstrated improved functional mobility today requiring min A for transfers and gait with RW. Pt continues to complain of pain, nausea, and light headedness. Current plan to d/c to SNF remains appropriate due to history of falls and pt's husband unable to provide physical assistance. Pt would benefit from cont skilled PT to improve balance, strength, and mobility.   Follow Up Recommendations  SNF     Equipment Recommendations  None recommended by PT    Recommendations for Other Services       Precautions / Restrictions Precautions Precautions: Cervical;Fall Precaution Comments: Reviewed cervical precautions with pt. Required Braces or Orthoses: Cervical Brace Cervical Brace: Soft collar Restrictions Weight Bearing Restrictions: No    Mobility  Bed Mobility Overal bed mobility: Needs Assistance Bed Mobility: Supine to Sit;Sit to Sidelying     Supine to sit: Min assist   Sit to sidelying: Mod assist;+2 for physical assistance General bed mobility comments: pt received in sitting   Transfers Overall transfer level: Needs assistance Equipment used: Rolling walker (2 wheeled) Transfers: Sit to/from Stand Sit to Stand: Min assist         General transfer comment: min A for lift assistance. Pt reports light headedness upon standing and unsteadiness initially.  Ambulation/Gait Ambulation/Gait assistance: Min assist Ambulation Distance (Feet): 20 Feet Assistive device: Rolling walker (2 wheeled) Gait Pattern/deviations: Step-to pattern;Decreased stride length;Shuffle;Trunk flexed Gait velocity: decreased Gait velocity  interpretation: Below normal speed for age/gender General Gait Details: Pt continued to present with shuffled gait, however demonstrated improved stablility during ambulation. Pt continued to require min A and RW for gait, but no LOB noted.    Stairs            Wheelchair Mobility    Modified Rankin (Stroke Patients Only)       Balance Overall balance assessment: Needs assistance Sitting-balance support: No upper extremity supported;Feet supported Sitting balance-Leahy Scale: Fair Sitting balance - Comments: sitting EOB with no back support   Standing balance support: Bilateral upper extremity supported;During functional activity Standing balance-Leahy Scale: Poor Standing balance comment: Reliant on RW for support.                             Cognition Arousal/Alertness: Awake/alert Behavior During Therapy: Flat affect Overall Cognitive Status: Within Functional Limits for tasks assessed                                 General Comments: Pt continues with decreased awareness of deficits, and increased lethargy this session      Exercises      General Comments General comments (skin integrity, edema, etc.): Pt complained of light headedness and nausea during mobility.       Pertinent Vitals/Pain Pain Assessment: Faces Pain Score: 4  Faces Pain Scale: Hurts even more Pain Location: neck Pain Descriptors / Indicators: Aching;Grimacing;Sore Pain Intervention(s): Monitored during session    Home Living Family/patient expects to be discharged to:: Private residence Living Arrangements: Spouse/significant other Available Help at Discharge: Family;Available 24 hours/day (but not able to provide physical  assist ) Type of Home: House (split level ) Home Access: Level entry   Home Layout: Multi-level (split level ) Home Equipment: Walker - 2 wheels;Cane - single point Additional Comments: Pt does not feel like her husband or son are able to  care for her    Prior Function Level of Independence: Independent with assistive device(s)      Comments: Pt reports she used cane and walker to walk at home.   PT Goals (current goals can now be found in the care plan section) Acute Rehab PT Goals Patient Stated Goal: to feel better PT Goal Formulation: With patient Time For Goal Achievement: 10/24/16 Potential to Achieve Goals: Fair Progress towards PT goals: Progressing toward goals    Frequency    7X/week      PT Plan Current plan remains appropriate    Co-evaluation              AM-PAC PT "6 Clicks" Daily Activity  Outcome Measure  Difficulty turning over in bed (including adjusting bedclothes, sheets and blankets)?: Total Difficulty moving from lying on back to sitting on the side of the bed? : Total Difficulty sitting down on and standing up from a chair with arms (e.g., wheelchair, bedside commode, etc,.)?: Total Help needed moving to and from a bed to chair (including a wheelchair)?: A Little Help needed walking in hospital room?: A Little Help needed climbing 3-5 steps with a railing? : Total 6 Click Score: 10    End of Session Equipment Utilized During Treatment: Gait belt;Cervical collar Activity Tolerance: Patient tolerated treatment well Patient left: in chair;with call bell/phone within reach;with chair alarm set Nurse Communication: Mobility status PT Visit Diagnosis: Unsteadiness on feet (R26.81);Repeated falls (R29.6);Pain Pain - part of body:  (neck)     Time: 8478-4128 PT Time Calculation (min) (ACUTE ONLY): 15 min  Charges:  $Therapeutic Activity: 8-22 mins                    G Codes:       Loma Sousa, SPT  [phone]   Loma Sousa 10/11/2016, 1:10 PM

## 2016-10-11 NOTE — Progress Notes (Signed)
Patient arrived to unit, vitals stable, oriented to room/unit.  Instructed and encouraged use of incentive spirometer.  Continue to monitor patient.

## 2016-10-11 NOTE — Progress Notes (Signed)
Took over patient at 2300 se is sleeping, will continue to monitor.

## 2016-10-11 NOTE — Evaluation (Signed)
Occupational Therapy Evaluation Patient Details [name] MRN: [id number] DOB: 10-26-1940 Today's Date: 10/11/2016    History of Present Illness Pt is s/p C4-7 ACDF. 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    Clinical Impression   PTA Pt independent in ADL and mobility with RW. Per notes, Pt has recent history of falls. Pt is currently mod A for ADL and Mod +2 with RW for mobility. Please see OT problem list. Pt requires skilled OT in the acute setting to maximize safety and independence in ADL and functional transfers and requires SNF level therapy at discharge to return to PLOF. Next session to focus on re-enforcing precautions and education for energy conservation during ADL.     Follow Up Recommendations  SNF;Supervision/Assistance - 24 hour    Equipment Recommendations  Other (comment) (defer to next venue)    Recommendations for Other Services       Precautions / Restrictions Precautions Precautions: Fall;Cervical Precaution Comments: Reviewed cervical precautions with pt. Required Braces or Orthoses: Cervical Brace Cervical Brace: Soft collar Restrictions Weight Bearing Restrictions: No      Mobility Bed Mobility Overal bed mobility: Needs Assistance Bed Mobility: Supine to Sit;Sit to Sidelying     Supine to sit: Min assist   Sit to sidelying: Mod assist;+2 for physical assistance General bed mobility comments: min A for trunk elevation. Verbal cues for log roll technique. Mod A +2 for sit>supine for LE management and to ensure appropriate log roll technique.   Transfers Overall transfer level: Needs assistance Equipment used: Rolling walker (2 wheeled) Transfers: Sit to/from Stand Sit to Stand: Mod assist;+2 safety/equipment         General transfer comment: Mod A for lift assist and steadying upon standing. Pt demonstrating shakiness in  BUE.     Balance Overall balance assessment: Needs assistance Sitting-balance support: Bilateral upper extremity supported;Feet supported Sitting balance-Leahy Scale: Fair Sitting balance - Comments: sitting EOB with no back support   Standing balance support: Bilateral upper extremity supported;During functional activity Standing balance-Leahy Scale: Poor Standing balance comment: Reliant on RW for support.                            ADL either performed or assessed with clinical judgement   ADL Overall ADL's : Needs assistance/impaired Eating/Feeding: Set up;Sitting   Grooming: Oral care;Wash/dry hands;Set up;Sitting Grooming Details (indicate cue type and reason): brought chair to sink as Pt could not sustain standing at sink for grooming Upper Body Bathing: Moderate assistance;Sitting   Lower Body Bathing: Maximal assistance;Sit to/from stand;+2 for safety/equipment   Upper Body Dressing : Moderate assistance;Sitting   Lower Body Dressing: Maximal assistance;+2 for safety/equipment   Toilet Transfer: Minimal assistance;+2 for safety/equipment;Ambulation;BSC   Toileting- Clothing Manipulation and Hygiene: Moderate assistance;+2 for safety/equipment   Tub/ Shower Transfer: Moderate assistance;+2 for safety/equipment;Ambulation   Functional mobility during ADLs: Moderate assistance;+2 for safety/equipment;+2 for physical assistance;Rolling walker (Pt R knee buckled x1, Pt able to self correct)       Vision Patient Visual Report: No change from baseline Vision Assessment?: No apparent visual deficits     Perception     Praxis      Pertinent Vitals/Pain Pain Assessment: 0-10 Pain Score: 4  Pain Location: neck  Pain Descriptors / Indicators: Sore;Operative site guarding Pain Intervention(s): Monitored during session;Repositioned     Hand Dominance Right  Extremity/Trunk Assessment Upper Extremity Assessment Upper Extremity Assessment: Generalized  weakness   Lower Extremity Assessment Lower Extremity Assessment: Defer to PT evaluation   Cervical / Trunk Assessment Cervical / Trunk Assessment: Other exceptions Cervical / Trunk Exceptions: s/p surgery    Communication Communication Communication: No difficulties   Cognition Arousal/Alertness: Awake/alert Behavior During Therapy: Flat affect Overall Cognitive Status: Within Functional Limits for tasks assessed                                 General Comments: Pt continues with decreased awareness of deficits, and increased lethargy this session   General Comments  Pt with shaking all over noted head, BUE, and Pt reports that this is relatively new (not with surgery but recent). It impacts her ability for BUE coordination for tasks like putting the cap on the toothpaste and similiar activities    Exercises     Shoulder Instructions      Home Living Family/patient expects to be discharged to:: Private residence Living Arrangements: Spouse/significant other Available Help at Discharge: Family;Available 24 hours/day (but not able to provide physical assist ) Type of Home: House (split level ) Home Access: Level entry     Home Layout: Multi-level (split level ) Alternate Level Stairs-Number of Steps: 2   Bathroom Shower/Tub: Teacher, early years/pre: Standard Bathroom Accessibility: Yes How Accessible: Accessible via walker Home Equipment: Spicer - 2 wheels;Cane - single point   Additional Comments: Pt does not feel like her husband or son are able to care for her      Prior Functioning/Environment Level of Independence: Independent with assistive device(s)        Comments: Pt reports she used cane and walker to walk at home.        OT Problem List: Decreased strength;Decreased range of motion;Decreased activity tolerance;Impaired balance (sitting and/or standing);Decreased coordination;Decreased safety awareness;Decreased knowledge of  use of DME or AE;Decreased knowledge of precautions;Pain      OT Treatment/Interventions: Self-care/ADL training;Therapeutic exercise;Energy conservation;DME and/or AE instruction;Therapeutic activities;Patient/family education;Balance training    OT Goals(Current goals can be found in the care plan section) Acute Rehab OT Goals Patient Stated Goal: to feel better  OT Goal Formulation: With patient Time For Goal Achievement: 10/25/16 Potential to Achieve Goals: Good ADL Goals Pt Will Perform Grooming: with supervision;standing Pt Will Perform Upper Body Bathing: with supervision;with adaptive equipment;sitting Pt Will Perform Lower Body Bathing: with adaptive equipment;sit to/from stand;with min guard assist Pt Will Transfer to Toilet: with supervision;ambulating (with RW; caregiver independent in assisting) Pt Will Perform Toileting - Clothing Manipulation and hygiene: with supervision;sit to/from stand Additional ADL Goal #1: Pt will perform bed mobility at supervision level with no verbal cues prior to ADL Additional ADL Goal #2: Pt will recall 3 ways of conserving energy during ADL with less than 2 verbal cues  OT Frequency: Min 2X/week   Barriers to D/C: Decreased caregiver support  Pt's husband is unable to provide physical assist needed at home       Co-evaluation              AM-PAC PT "6 Clicks" Daily Activity     Outcome Measure Help from another person eating meals?: None Help from another person taking care of personal grooming?: A Little Help from another person toileting, which includes using toliet, bedpan, or urinal?: A Lot Help from another person bathing (including washing, rinsing, drying)?: A Lot Help  from another person to put on and taking off regular upper body clothing?: A Lot Help from another person to put on and taking off regular lower body clothing?: A Lot 6 Click Score: 15   End of Session Equipment Utilized During Treatment: Gait belt;Rolling  walker;Cervical collar Nurse Communication: Mobility status  Activity Tolerance: Patient limited by lethargy Patient left: in bed;with call bell/phone within reach;with bed alarm set  OT Visit Diagnosis: Unsteadiness on feet (R26.81);Other abnormalities of gait and mobility (R26.89);History of falling (Z91.81);Pain Pain - Right/Left: Right Pain - part of body: Leg (back)                Time: 8138-8719 OT Time Calculation (min): 28 min Charges:  OT General Charges $OT Visit: 1 Procedure OT Evaluation $OT Eval Moderate Complexity: 1 Procedure OT Treatments $Self Care/Home Management : 8-22 mins G-Codes:     Hulda Humphrey OTR/L [place] 10/11/2016, 11:48 AM

## 2016-10-12 LAB — GLUCOSE, CAPILLARY
GLUCOSE-CAPILLARY: 88 mg/dL (ref 65–99)
GLUCOSE-CAPILLARY: 184 mg/dL — AB (ref 65–99)
Glucose-Capillary: 155 mg/dL — ABNORMAL HIGH (ref 65–99)
Glucose-Capillary: 182 mg/dL — ABNORMAL HIGH (ref 65–99)

## 2016-10-12 NOTE — Progress Notes (Signed)
Physical Therapy Treatment Patient Details [name] MRN: [id number] DOB: 1940-10-03 Today's Date: 10/12/2016    History of Present Illness Pt is s/p C4-7 ACDF. 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     PT Comments    Pt very motivated, however continues to be limited by neck pain and weakness. Pt demonstrated improve activity tolerance with gait with ability to complete 22ft with RW and min Demetra Moya assistance. Pt presented with increased shuffled gait toward end of session and complained of increased neck pain. Pt would benefit from continued skilled PT to improve activity tolerance and functional mobility. Current plan of care remains appropriate.    Follow Up Recommendations  SNF     Equipment Recommendations  None recommended by PT    Recommendations for Other Services       Precautions / Restrictions Precautions Precautions: Cervical;Fall Required Braces or Orthoses: Cervical Brace Cervical Brace: Soft collar Restrictions Weight Bearing Restrictions: No    Mobility  Bed Mobility Overal bed mobility: Needs Assistance Bed Mobility: Supine to Sit;Sit to Supine     Supine to sit: Supervision;HOB elevated Sit to supine: Supervision   General bed mobility comments: VCs required to maintain spinal precautions. Pt did not require physical assistance but took extra time.   Transfers Overall transfer level: Needs assistance Equipment used: Rolling walker (2 wheeled) Transfers: Sit to/from Stand Sit to Stand: Min assist         General transfer comment: Pt attempted sit to stand without assistance but had poor power up. Pt required min A to power up.   Ambulation/Gait Ambulation/Gait assistance: Min Terissa Haffey Ambulation Distance (Feet): 35 Feet Assistive device: Rolling walker (2 wheeled) Gait Pattern/deviations: Step-to pattern;Decreased stride  length;Shuffle;Trunk flexed Gait velocity: decreased Gait velocity interpretation: Below normal speed for age/gender General Gait Details: Pt demonstrated improved activity tolerance today, however shuffled gait more pronounced with fatigue. Pt also complained of L shooting leg pain during gait.    Stairs            Wheelchair Mobility    Modified Rankin (Stroke Patients Only)       Balance Overall balance assessment: Needs assistance Sitting-balance support: No upper extremity supported;Feet supported Sitting balance-Leahy Scale: Fair     Standing balance support: Bilateral upper extremity supported;During functional activity Standing balance-Leahy Scale: Poor Standing balance comment: Reliant on RW                             Cognition Arousal/Alertness: Awake/alert Behavior During Therapy: Flat affect Overall Cognitive Status: Within Functional Limits for tasks assessed                                        Exercises      General Comments        Pertinent Vitals/Pain Pain Assessment: Faces Faces Pain Scale: Hurts whole lot Pain Location: neck and described shooting pain down L leg during gait.  Pain Descriptors / Indicators: Aching;Grimacing;Shooting;Sore Pain Intervention(s): Limited activity within patient's tolerance;Monitored during session    Home Living                      Prior Function            PT Goals (current goals can  now be found in the care plan section) Acute Rehab PT Goals Patient Stated Goal: to feel better PT Goal Formulation: With patient Time For Goal Achievement: 10/24/16 Potential to Achieve Goals: Fair Progress towards PT goals: Progressing toward goals    Frequency    Min 5X/week      PT Plan Current plan remains appropriate    Co-evaluation              AM-PAC PT "6 Clicks" Daily Activity  Outcome Measure  Difficulty turning over in bed (including adjusting  bedclothes, sheets and blankets)?: Total Difficulty moving from lying on back to sitting on the side of the bed? : Total Difficulty sitting down on and standing up from a chair with arms (e.g., wheelchair, bedside commode, etc,.)?: Total Help needed moving to and from a bed to chair (including a wheelchair)?: A Little Help needed walking in hospital room?: A Little Help needed climbing 3-5 steps with a railing? : Total 6 Click Score: 10    End of Session Equipment Utilized During Treatment: Gait belt;Cervical collar Activity Tolerance: Patient limited by pain Patient left: in bed;with call bell/phone within reach;with bed alarm set Nurse Communication: Mobility status PT Visit Diagnosis: Unsteadiness on feet (R26.81);Repeated falls (R29.6);Difficulty in walking, not elsewhere classified (R26.2)     Time: 8938-1017 PT Time Calculation (min) (ACUTE ONLY): 18 min  Charges:  $Gait Training: 8-22 mins                    G Codes:       Loma Sousa, SPT  [phone]   Loma Sousa 10/12/2016, 9:09 AM

## 2016-10-12 NOTE — NC FL2 (Signed)
[place] LEVEL OF CARE SCREENING TOOL     IDENTIFICATION  Patient [name] Birthdate: 1940/06/20 Sex: female Admission Date (Current Location): 10/10/2016  [facility] and [place] Number:  Herbalist and Address:  [facility] [place], [place] [address] [place], [place] [address]      Provider Number: [id number]  Attending Physician Name and Address:  Earnie Larsson, MD  Relative Name and Phone Number:  Patton Swisher, [phone]    Current Level of Care: Hospital Recommended Level of Care: [place] Prior Approval Number:    Date Approved/Denied:   PASRR Number: [id number]  Discharge Plan: SNF    Current Diagnoses: Patient Active Problem List   Diagnosis Date Noted  . Cervical stenosis of spinal canal 10/10/2016  . Cervical radiculopathy at C7 07/02/2016  . Acute midline thoracic back pain 07/02/2016  . COPD exacerbation ([place]) 06/11/2016  . Epigastric hernia 04/01/2016  . Umbilical pain 46/65/9935  . Coronary artery disease involving native coronary artery of native heart with unstable angina pectoris ([place])   . Type 2 DM with CKD stage 3 and hypertension ([place])   . Chronic renal insufficiency   . Type 2 diabetes mellitus, uncontrolled, with neuropathy ([place]) 09/27/2015  . Shortness of breath 09/12/2015  . Hypertensive heart disease   . OSA on CPAP 07/07/2015  . Hospital discharge follow-up 07/07/2015  . Chest pain with low risk of acute coronary syndrome 06/29/2015  . Community acquired pneumonia   . Pleural effusion, right   . Fall 04/19/2015  . Acute on chronic kidney failure ([place]) 04/19/2015  . Chronic diastolic CHF (congestive heart failure) ([place]) 04/18/2015  . Diabetic neuropathy ([place]) 02/21/2015  . Benign neoplasm of sigmoid colon   . Diabetic diarrhea ([place]) 10/03/2014  . Low back pain with radiation 04/04/2014  . Essential hypertension 04/04/2014  . Hyperlipidemia 02/23/2014  . Long-term use  of high-risk medication 02/23/2014  . Dermatophytic onychia 08/17/2013  . Angina pectoris ([place]) 08/17/2013  . Arteriosclerosis of coronary artery 08/17/2013  . Cerebral infarct ([place]) 08/17/2013  . Bony exostosis 08/17/2013  . Arthritis 08/05/2013  . History of colonic polyps 02/20/2013  . Sarcoidosis ([place]) 12/07/2012  . Dysphagia 12/07/2012  . Nocturnal cough 10/25/2012  . Pulmonary nodule seen on imaging study 05/20/2012  . 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 Cardiomyopathy, ischemic   . Hepatic steatosis   . Osteoporosis, post-menopausal   . Peripheral vascular disease due to secondary diabetes mellitus ([place])   . DM (diabetes mellitus), type 2, uncontrolled, with renal complications ([place]) 70/17/7939  . Double vessel coronary artery disease 12/14/2011    Orientation RESPIRATION BLADDER Height & Weight     Self, Time, Situation, Place  Normal Continent Weight: 198 lb 6.6 oz (90 kg) Height:  5\' 7"  (170.2 cm)  BEHAVIORAL SYMPTOMS/MOOD NEUROLOGICAL BOWEL NUTRITION STATUS      Continent Diet (diet soft/fluid consistensy:thin)  AMBULATORY STATUS COMMUNICATION OF NEEDS Skin   Extensive Assist Verbally Normal                       Personal Care Assistance Level of Assistance  Bathing, Feeding, Dressing Bathing Assistance: Maximum assistance Feeding assistance: Independent Dressing Assistance: Maximum assistance     Functional Limitations Info  Sight, Hearing, Speech Sight Info: Adequate Hearing Info: Adequate Speech Info: Adequate    SPECIAL CARE FACTORS FREQUENCY  PT (By licensed PT)  PT Frequency: 5x wk OT Frequency: 5x wk            Contractures Contractures Info: Not present    Additional Factors Info  Code Status, Allergies Code Status Info: Full Code Allergies Info: CEPHALEXIN, CONTRAST MEDIA IODINATED DIAGNOSTIC AGENTS, DOXYCYCLINE, GABAPENTIN, SULFA ANTIBIOTICS           Current  Medications (10/12/2016):  This is the current hospital active medication list Current Facility-Administered Medications  Medication Dose Route Frequency Provider Last Rate Last Dose  . 0.9 %  sodium chloride infusion  250 mL Intravenous Continuous Falesha Schommer, Mallie Mussel, MD      . albuterol (PROVENTIL) (2.5 MG/3ML) 0.083% nebulizer solution 2.5 mg  2.5 mg Nebulization Q6H PRN Earnie Larsson, MD      . ALPRAZolam [name]) tablet 0.5 mg  0.5 mg Oral Daily Earnie Larsson, MD   0.5 mg at 10/12/16 1031  . cyclobenzaprine (FLEXERIL) tablet 10 mg  10 mg Oral TID PRN Earnie Larsson, MD   10 mg at 10/11/16 1433  . docusate sodium (COLACE) capsule 100 mg  100 mg Oral BID PRN Earnie Larsson, MD   100 mg at 10/12/16 1032  . fluticasone (FLONASE) 50 MCG/ACT nasal spray 2 spray  2 spray Each Nare Daily PRN Earnie Larsson, MD      . furosemide (LASIX) tablet 20 mg  20 mg Oral Constance Haw, MD   20 mg at 10/11/16 0930  . HYDROcodone-acetaminophen (NORCO/VICODIN) 5-325 MG per tablet 1-2 tablet  1-2 tablet Oral Q4H PRN Earnie Larsson, MD   2 tablet at 10/12/16 [phone]  . HYDROmorphone (DILAUDID) injection 0.5-1 mg  0.5-1 mg Intravenous Q2H PRN Earnie Larsson, MD   0.5 mg at 10/11/16 0455  . insulin aspart (novoLOG) injection 0-20 Units  0-20 Units Subcutaneous TID WC Earnie Larsson, MD   7 Units at 10/11/16 1723  . insulin aspart protamine- aspart (NOVOLOG MIX 70/30) injection 44 Units  44 Units Subcutaneous Q breakfast Earnie Larsson, MD   44 Units at 10/12/16 [phone]  . insulin aspart protamine- aspart (NOVOLOG MIX 70/30) injection 60 Units  60 Units Subcutaneous Q supper Jaquita Folds, RPH   60 Units at 10/11/16 1723  . isosorbide mononitrate (IMDUR) 24 hr tablet 30 mg  30 mg Oral Daily Earnie Larsson, MD   30 mg at 10/12/16 1032  . losartan (COZAAR) tablet 100 mg  100 mg Oral Daily Earnie Larsson, MD   100 mg at 10/12/16 1032  . menthol-cetylpyridinium (CEPACOL) lozenge 3 mg  1 lozenge Oral PRN Earnie Larsson, MD       Or  . phenol (CHLORASEPTIC) mouth spray  1 spray  1 spray Mouth/Throat PRN Earnie Larsson, MD   1 spray at 10/10/16 1350  . metoprolol succinate (TOPROL-XL) 24 hr tablet 25 mg  25 mg Oral Daily Earnie Larsson, MD   25 mg at 10/12/16 1032  . mometasone-formoterol (DULERA) 200-5 MCG/ACT inhaler 2 puff  2 puff Inhalation BID Earnie Larsson, MD   2 puff at 10/12/16 1044  . nitroGLYCERIN (NITROSTAT) SL tablet 0.4 mg  0.4 mg Sublingual Q5 min PRN Earnie Larsson, MD      . ondansetron [facility]) tablet 4 mg  4 mg Oral Q6H PRN Earnie Larsson, MD       Or  . ondansetron [facility]) injection 4 mg  4 mg Intravenous Q6H PRN Earnie Larsson, MD   4 mg at 10/11/16 0453  . ondansetron (ZOFRAN) tablet 4 mg  4 mg Oral Q8H PRN Kaz Auld,  Mallie Mussel, MD      . PARoxetine (PAXIL) tablet 20 mg  20 mg Oral Daily Earnie Larsson, MD   20 mg at 10/12/16 1032  . pregabalin (LYRICA) capsule 100 mg  100 mg Oral BID Earnie Larsson, MD   100 mg at 10/12/16 1031  . rosuvastatin (CRESTOR) tablet 20 mg  20 mg Oral Daily Earnie Larsson, MD   20 mg at 10/11/16 0930  . sodium chloride flush (NS) 0.9 % injection 3 mL  3 mL Intravenous Q12H Earnie Larsson, MD   3 mL at 10/12/16 1033  . sodium chloride flush (NS) 0.9 % injection 3 mL  3 mL Intravenous PRN Earnie Larsson, MD      . ticagrelor [facility]) tablet 90 mg  90 mg Oral BID Earnie Larsson, MD   90 mg at 10/12/16 1032   Facility-Administered Medications Ordered in Other Encounters  Medication Dose Route Frequency Provider Last Rate Last Dose  . albuterol (PROVENTIL) (5 MG/ML) 0.5% nebulizer solution 2.5 mg  2.5 mg Nebulization Once [name], MD         Discharge Medications: Please see discharge summary for a list of discharge medications.  Relevant Imaging Results:  Relevant Lab Results:   Additional Information SS#[id number]  Wende Neighbors, LCSW

## 2016-10-12 NOTE — Clinical Social Work Note (Signed)
Clinical Social Work Assessment  Patient Details  [name] MRN: [id number] Date of Birth: 1940/08/04  Date of referral:  10/12/16               Reason for consult:  Discharge Planning                Permission sought to share information with:  Family Supports Permission granted to share information::  Yes, Verbal Permission Granted  Name::     [place]::     Relationship::  spouse  Contact Information:  [phone]  Housing/Transportation Living arrangements for the past 2 months:  Single Family Home Source of Information:  Patient Patient Interpreter Needed:  None Criminal Activity/Legal Involvement Pertinent to Current Situation/Hospitalization:  No - Comment as needed Significant Relationships:  Adult Children, Spouse, Other Family Members Lives with:  Spouse, Self Do you feel safe going back to the place where you live?  Yes Need for family participation in patient care:  Yes (Comment)  Care giving concerns: Patient lives at home with spouse. Patient stated she has support from other family members    Social Worker assessment / plan: Clinical Social Worker met patient at bedside to discuss SNF placement after discharge to continue rehab. Patient stated she is agreeable to discharge to SNF because she does not believe that her husband and son will be able to provide the care she needs.Patient stated she is agreeable to go to SNF and would prefer placement be in [place]. CSW to complete necessary paper work and initiate SNF search on patient behalf.CSW to follow up with patient once bed offers are available.  Employment status:  Retired Forensic scientist:  Medicare PT Recommendations:  [place] / Referral to community resources:  [place]  Patient/Family's Response to care:  Patient verbalized appreciation for assisting in SNF placement   Patient/Family's Understanding of and Emotional Response to  Diagnosis, Current Treatment, and Prognosis:  Patient with good understanding of current medical state and limitations around recent hospitalization. Patient agreeable with SNF placement in hopes of being independent again and returning home to spouse Emotional Assessment Appearance:  Appears stated age Attitude/Demeanor/Rapport:  Other Affect (typically observed):  Pleasant Orientation:  Oriented to Situation, Oriented to  Time, Oriented to Place, Oriented to Self Alcohol / Substance use:  Not Applicable Psych involvement (Current and /or in the community):  No (Comment)  Discharge Needs  Concerns to be addressed:  No discharge needs identified Readmission within the last 30 days:  No Current discharge risk:  None Barriers to Discharge:  No Barriers Identified   Wende Neighbors, LCSW 10/12/2016, 2:06 PM

## 2016-10-12 NOTE — Progress Notes (Signed)
Pt seen and examined.  No issues overnight. No concerns this morning  EXAM: Temp:  [98.2 F (36.8 C)-98.9 F (37.2 C)] 98.6 F (37 C) (06/03 0522) Pulse Rate:  [71-88] 88 (06/03 0522) Resp:  [16-20] 20 (06/03 0522) BP: (118-184)/(44-92) 141/66 (06/03 0522) SpO2:  [95 %-99 %] 99 % (06/03 0522) Intake/Output      06/02 0701 - 06/03 0700 06/03 0701 - 06/04 0700   I.V. (mL/kg) 3 (0)    Total Intake(mL/kg) 3 (0)    Urine (mL/kg/hr)     Emesis/NG output     Blood     Total Output       Net +3          Urine Occurrence 3 x     Awake and alert Follows commands throughout Full strength Wound c/d/i  Stable Continue current care Dispo planning Likely to SNF tomorrow

## 2016-10-13 ENCOUNTER — Encounter
Admission: RE | Admit: 2016-10-13 | Discharge: 2016-10-13 | Disposition: A | Discharge status: Home / Self Care | Payer: Medicare Other | Source: Ambulatory Visit | Attending: Internal Medicine | Admitting: Internal Medicine

## 2016-10-13 ENCOUNTER — Encounter (HOSPITAL_COMMUNITY): Payer: Self-pay | Admitting: Neurosurgery

## 2016-10-13 DIAGNOSIS — I25110 Atherosclerotic heart disease of native coronary artery with unstable angina pectoris: Secondary | ICD-10-CM | POA: Diagnosis not present

## 2016-10-13 DIAGNOSIS — R05 Cough: Secondary | ICD-10-CM | POA: Diagnosis not present

## 2016-10-13 DIAGNOSIS — M2061 Acquired deformities of toe(s), unspecified, right foot: Secondary | ICD-10-CM | POA: Diagnosis not present

## 2016-10-13 DIAGNOSIS — M25511 Pain in right shoulder: Secondary | ICD-10-CM | POA: Diagnosis not present

## 2016-10-13 DIAGNOSIS — M9921 Subluxation stenosis of neural canal of cervical region: Secondary | ICD-10-CM | POA: Diagnosis not present

## 2016-10-13 DIAGNOSIS — E1129 Type 2 diabetes mellitus with other diabetic kidney complication: Secondary | ICD-10-CM | POA: Diagnosis not present

## 2016-10-13 DIAGNOSIS — M19011 Primary osteoarthritis, right shoulder: Secondary | ICD-10-CM | POA: Diagnosis not present

## 2016-10-13 DIAGNOSIS — Z89421 Acquired absence of other right toe(s): Secondary | ICD-10-CM | POA: Diagnosis not present

## 2016-10-13 DIAGNOSIS — W19XXXA Unspecified fall, initial encounter: Secondary | ICD-10-CM | POA: Diagnosis not present

## 2016-10-13 DIAGNOSIS — Z7982 Long term (current) use of aspirin: Secondary | ICD-10-CM | POA: Diagnosis not present

## 2016-10-13 DIAGNOSIS — M79601 Pain in right arm: Secondary | ICD-10-CM | POA: Diagnosis not present

## 2016-10-13 DIAGNOSIS — Z981 Arthrodesis status: Secondary | ICD-10-CM | POA: Diagnosis not present

## 2016-10-13 DIAGNOSIS — E1151 Type 2 diabetes mellitus with diabetic peripheral angiopathy without gangrene: Secondary | ICD-10-CM | POA: Diagnosis not present

## 2016-10-13 DIAGNOSIS — I110 Hypertensive heart disease with heart failure: Secondary | ICD-10-CM | POA: Diagnosis not present

## 2016-10-13 DIAGNOSIS — E1122 Type 2 diabetes mellitus with diabetic chronic kidney disease: Secondary | ICD-10-CM | POA: Diagnosis not present

## 2016-10-13 DIAGNOSIS — N183 Chronic kidney disease, stage 3 (moderate): Secondary | ICD-10-CM | POA: Diagnosis not present

## 2016-10-13 DIAGNOSIS — F418 Other specified anxiety disorders: Secondary | ICD-10-CM | POA: Diagnosis not present

## 2016-10-13 DIAGNOSIS — R41841 Cognitive communication deficit: Secondary | ICD-10-CM | POA: Diagnosis not present

## 2016-10-13 DIAGNOSIS — M810 Age-related osteoporosis without current pathological fracture: Secondary | ICD-10-CM | POA: Diagnosis not present

## 2016-10-13 DIAGNOSIS — Z794 Long term (current) use of insulin: Secondary | ICD-10-CM | POA: Diagnosis not present

## 2016-10-13 DIAGNOSIS — R1310 Dysphagia, unspecified: Secondary | ICD-10-CM | POA: Diagnosis not present

## 2016-10-13 DIAGNOSIS — D860 Sarcoidosis of lung: Secondary | ICD-10-CM | POA: Diagnosis not present

## 2016-10-13 DIAGNOSIS — I5032 Chronic diastolic (congestive) heart failure: Secondary | ICD-10-CM | POA: Diagnosis not present

## 2016-10-13 DIAGNOSIS — M6281 Muscle weakness (generalized): Secondary | ICD-10-CM | POA: Diagnosis not present

## 2016-10-13 DIAGNOSIS — M25512 Pain in left shoulder: Secondary | ICD-10-CM | POA: Diagnosis not present

## 2016-10-13 DIAGNOSIS — Z4789 Encounter for other orthopedic aftercare: Secondary | ICD-10-CM | POA: Diagnosis not present

## 2016-10-13 DIAGNOSIS — E785 Hyperlipidemia, unspecified: Secondary | ICD-10-CM | POA: Diagnosis not present

## 2016-10-13 DIAGNOSIS — Z9989 Dependence on other enabling machines and devices: Secondary | ICD-10-CM | POA: Diagnosis not present

## 2016-10-13 DIAGNOSIS — I10 Essential (primary) hypertension: Secondary | ICD-10-CM | POA: Diagnosis not present

## 2016-10-13 DIAGNOSIS — M19012 Primary osteoarthritis, left shoulder: Secondary | ICD-10-CM | POA: Diagnosis not present

## 2016-10-13 DIAGNOSIS — M4802 Spinal stenosis, cervical region: Secondary | ICD-10-CM | POA: Diagnosis not present

## 2016-10-13 DIAGNOSIS — Z7951 Long term (current) use of inhaled steroids: Secondary | ICD-10-CM | POA: Diagnosis not present

## 2016-10-13 DIAGNOSIS — R262 Difficulty in walking, not elsewhere classified: Secondary | ICD-10-CM | POA: Diagnosis not present

## 2016-10-13 DIAGNOSIS — S199XXA Unspecified injury of neck, initial encounter: Secondary | ICD-10-CM | POA: Diagnosis not present

## 2016-10-13 DIAGNOSIS — Z7902 Long term (current) use of antithrombotics/antiplatelets: Secondary | ICD-10-CM | POA: Diagnosis not present

## 2016-10-13 DIAGNOSIS — G4733 Obstructive sleep apnea (adult) (pediatric): Secondary | ICD-10-CM | POA: Diagnosis not present

## 2016-10-13 DIAGNOSIS — E1140 Type 2 diabetes mellitus with diabetic neuropathy, unspecified: Secondary | ICD-10-CM | POA: Diagnosis not present

## 2016-10-13 LAB — GLUCOSE, CAPILLARY
Glucose-Capillary: 90 mg/dL (ref 65–99)
Glucose-Capillary: 168 mg/dL — ABNORMAL HIGH (ref 65–99)

## 2016-10-13 MED ORDER — CYCLOBENZAPRINE HCL 10 MG PO TABS: 10.0000 mg | ORAL_TABLET | Freq: Three times a day (TID) | ORAL | 0 refills | Status: DC | PRN

## 2016-10-13 MED ORDER — ALPRAZOLAM 0.5 MG PO TABS: 0.5000 mg | ORAL_TABLET | Freq: Every day | ORAL | 0 refills | Status: DC

## 2016-10-13 MED ORDER — HYDROCODONE-ACETAMINOPHEN 5-325 MG PO TABS: 1.0000 | ORAL_TABLET | ORAL | 0 refills | Status: DC | PRN

## 2016-10-13 MED FILL — Thrombin For Soln 20000 Unit: CUTANEOUS | Qty: 1 | Status: AC

## 2016-10-13 MED FILL — Gelatin Absorbable MT Powder: OROMUCOSAL | Qty: 1 | Status: AC

## 2016-10-13 MED FILL — Thrombin For Soln 5000 Unit: CUTANEOUS | Qty: 5000 | Status: AC

## 2016-10-13 NOTE — Discharge Instructions (Signed)

## 2016-10-13 NOTE — Care Management Note (Signed)
Case Management Note  Patient Details  [name] MRN: [id number] Date of Birth: 1940/07/18  Subjective/Objective:                    Action/Plan: Pt discharging to SNF today. No further needs per CM.   Expected Discharge Date:  10/13/16               Expected Discharge Plan:  Skilled Nursing Facility  In-House Referral:  Clinical Social Work  Discharge planning Services     Post Acute Care Choice:    Choice offered to:     DME Arranged:    DME Agency:     HH Arranged:    [place] Agency:     Status of Service:  Completed, signed off  If discussed at [employer], dates discussed:    Additional Comments:  Pollie Friar, RN 10/13/2016, 1:45 PM

## 2016-10-13 NOTE — Clinical Social Work Placement (Signed)
   CLINICAL SOCIAL WORK PLACEMENT  NOTE  Date:  10/13/2016  Patient Details  [name] MRN: [id number] Date of Birth: 08/07/1940  Clinical Social Work is seeking post-discharge placement for this patient at the [place] level of care (*CSW will initial, date and re-position this form in  chart as items are completed):  Yes   Patient/family provided with [place] Work Department's list of facilities offering this level of care within the geographic area requested by the patient (or if unable, by the patient's family).  Yes   Patient/family informed of their freedom to choose among providers that offer the needed level of care, that participate in Medicare, Medicaid or managed care program needed by the patient, have an available bed and are willing to accept the patient.  Yes   Patient/family informed of [facility]'s ownership interest in [facility] and [facility], as well as of the fact that they are under no obligation to receive care at these facilities.  PASRR submitted to EDS on       PASRR number received on       Existing PASRR number confirmed on 10/12/16     FL2 transmitted to all facilities in geographic area requested by pt/family on       FL2 transmitted to all facilities within larger geographic area on       Patient informed that his/her managed care company has contracts with or will negotiate with certain facilities, including the following:        Yes   Patient/family informed of bed offers received.  Patient chooses bed at [facility]     Physician recommends and patient chooses bed at      Patient to be transferred to [facility] on 10/13/16.  Patient to be transferred to facility by [facility]     Patient family notified on 10/13/16 of transfer.  Name of family member notified:  [facility]     PHYSICIAN       Additional Comment:    _______________________________________________ Geralynn Ochs, LCSW 10/13/2016, 3:19 PM

## 2016-10-13 NOTE — Discharge Summary (Addendum)
Physician Discharge Summary  Patient ID: Pamela Collier MRN: [id number] DOB/AGE: 1940-09-12 76 y.o.  Admit date: 10/10/2016 Discharge date: 10/13/2016  Admission Diagnoses:  Discharge Diagnoses:  Active Problems:   Cervical stenosis of spinal canal   Discharged Condition: fair  Hospital Course: Patient mid-in the hospital for treatment of her symptomatic cervical stenosis. Patient underwent uncomplicated three-level anterior cervical decompression and fusion. Postoperatively she is doing reasonably well. She is swallowing well. Her pain is well-controlled. She is mobilizing with therapy but remains quite limited in terms of safe ambulation. And is for discharge to skilled nursing facility.  Consults:   Significant Diagnostic Studies:   Treatments:   Discharge Exam: Blood pressure (!) 153/60, pulse 75, temperature 98.6 F (37 C), temperature source Oral, resp. rate 20, height 5\' 7"  (1.702 m), weight 90 kg (198 lb 6.6 oz), SpO2 98 %. Awake and alert. Oriented and appropriate. Cranial nerve function intact. Motor examination with generalized tremulousness and some diffuse nonfocal weakness. Sensory examination nonfocal. Wound clean and dry. Chest and abdomen benign.  Disposition: 01-Home or Self Care   Allergies as of 10/13/2016      Reactions   Cephalexin Itching   Contrast Media [iodinated Diagnostic Agents] Itching   Doxycycline Itching   Gabapentin Itching   Sulfa Antibiotics Itching      Medication List    STOP taking these medications   diazepam 5 MG tablet Commonly known as:  VALIUM     TAKE these medications   acetaminophen 500 MG tablet Commonly known as:  TYLENOL Take 1,000 mg by mouth every 6 (six) hours as needed for mild pain or headache.   albuterol 108 (90 Base) MCG/ACT inhaler Commonly known as:  PROAIR HFA Inhale 2 puffs into the lungs every 6 (six) hours as needed for wheezing or shortness of breath.   albuterol (2.5 MG/3ML) 0.083% nebulizer  solution Commonly known as:  PROVENTIL Take 3 mLs (2.5 mg total) by nebulization every 6 (six) hours as needed for wheezing or shortness of breath.   alendronate 70 MG tablet Commonly known as:  FOSAMAX Take 1 tablet (70 mg total) by mouth once a week. Pt takes on Sunday.   Take with a full glass of water on an empty stomach.   ALPRAZolam 0.5 MG tablet Commonly known as:  XANAX Take 0.5 mg by mouth daily.   aspirin EC 81 MG tablet Take 81 mg by mouth daily.   B-D UF III MINI PEN NEEDLES 31G X 5 MM Misc Generic drug:  Insulin Pen Needle USE THREE TIMES A DAY   BRILINTA 90 MG Tabs tablet Generic drug:  ticagrelor TAKE 1 TABLET TWICE A DAY   budesonide-formoterol 160-4.5 MCG/ACT inhaler Commonly known as:  SYMBICORT Inhale 2 puffs into the lungs 2 (two) times daily.   cyclobenzaprine 10 MG tablet Commonly known as:  FLEXERIL Take 1 tablet (10 mg total) by mouth 3 (three) times daily as needed for muscle spasms.   docusate sodium 100 MG capsule Commonly known as:  COLACE Take 100 mg by mouth 2 (two) times daily as needed for mild constipation. Reported on 11/09/2015   fluticasone 50 MCG/ACT nasal spray Commonly known as:  FLONASE Place 2 sprays into both nostrils daily as needed for rhinitis.   furosemide 20 MG tablet Commonly known as:  LASIX Take 1 tablet (20 mg total) by mouth daily. What changed:  when to take this   glucose blood test strip Commonly known as:  ACCU-CHEK AVIVA PLUS USE TO  CHECK GLUCOSE  THREE TIMES DAILY   HYDROcodone-acetaminophen 5-325 MG tablet Commonly known as:  NORCO/VICODIN Take 1-2 tablets by mouth every 4 (four) hours as needed (breakthrough pain).   Insulin Lispro Prot & Lispro (75-25) 100 UNIT/ML Kwikpen Commonly known as:  HUMALOG MIX 75/25 KWIKPEN Inject 45 to 60 units twice daily before morning and evening meals What changed:  additional instructions   isosorbide mononitrate 30 MG 24 hr tablet Commonly known as:  IMDUR TAKE 1  TABLET DAILY   losartan 100 MG tablet Commonly known as:  COZAAR TAKE 1 TABLET DAILY   metoprolol succinate 25 MG 24 hr tablet Commonly known as:  TOPROL-XL Take 1 tablet (25 mg total) by mouth daily.   nitroGLYCERIN 0.4 MG SL tablet Commonly known as:  NITROSTAT Place 1 tablet (0.4 mg total) under the tongue every 5 (five) minutes as needed for chest pain.   ondansetron 4 MG tablet Commonly known as:  ZOFRAN Take 1 tablet (4 mg total) by mouth every 8 (eight) hours as needed for nausea or vomiting.   PARoxetine 20 MG tablet Commonly known as:  PAXIL TAKE ONE TABLET BY MOUTH ONCE DAILY   polyethylene glycol packet Commonly known as:  MIRALAX / GLYCOLAX Take 17 g by mouth daily as needed for mild constipation. Reported on 10/25/2015   pregabalin 100 MG capsule Commonly known as:  LYRICA Take 1 capsule (100 mg total) by mouth 3 (three) times daily. What changed:  when to take this   rosuvastatin 20 MG tablet Commonly known as:  CRESTOR Take 20 mg by mouth daily.        Signed: Sutter Ahlgren A 10/13/2016, 12:36 PM

## 2016-10-13 NOTE — Progress Notes (Signed)
Patient's BP elevated at 180/90. MD paged to notify since patient's BP med is scheduled at 10 am.. MD call and no new orders received at this time. He said to give the BP medication at the scheduled time.

## 2016-10-13 NOTE — Progress Notes (Signed)
Physical Therapy Treatment Patient Details [name] MRN: [id number] DOB: 26-Apr-1941 Today's Date: 10/13/2016    History of Present Illness Pt is s/p C4-7 ACDF. 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     PT Comments    Pt presented with improved activity tolerance and reports decreased pain today. Pt demonstrated improvements with bed mobility and transfers, but continues to require supervision and VCs throughout. Pt ambulated 32ft with RW and supervision, but presented with increased shuffling and decreased gait speed due to fatigue. Pt would benefit from continued skilled PT to improve functional mobility and maximize return to prior level of function. Current plan remains appropriate.   Follow Up Recommendations  SNF     Equipment Recommendations  None recommended by PT    Recommendations for Other Services       Precautions / Restrictions Precautions Precautions: Cervical;Fall Required Braces or Orthoses: Cervical Brace Cervical Brace: Soft collar Restrictions Weight Bearing Restrictions: No    Mobility  Bed Mobility Overal bed mobility: Needs Assistance Bed Mobility: Supine to Sit     Supine to sit: Supervision;HOB elevated     General bed mobility comments: supervision for safety. No physical assistance needed with HOB elevated  Transfers Overall transfer level: Needs assistance Equipment used: Rolling walker (2 wheeled) Transfers: Sit to/from Stand Sit to Stand: Supervision         General transfer comment: VCs for hand placement on RW. Pt with decreased power up during sit to stand, but able to stand after 2 attempts without physical assistance  Ambulation/Gait Ambulation/Gait assistance: Supervision Ambulation Distance (Feet): 80 Feet Assistive device: Rolling walker (2 wheeled) Gait Pattern/deviations: Step-to pattern;Shuffle;Trunk  flexed Gait velocity: decreased Gait velocity interpretation: Below normal speed for age/gender General Gait Details: Pt with improved activity tolerance with ability to ambulate 75ft with RW. Pt very fatigued at end of session and presented with increased shuffled gait and decreased cadence. VCs to increase stride length.   Stairs            Wheelchair Mobility    Modified Rankin (Stroke Patients Only)       Balance Overall balance assessment: Needs assistance Sitting-balance support: No upper extremity supported;Feet supported Sitting balance-Leahy Scale: Good     Standing balance support: Bilateral upper extremity supported;During functional activity Standing balance-Leahy Scale: Poor Standing balance comment: Reliant on RW                             Cognition Arousal/Alertness: Awake/alert Behavior During Therapy: Flat affect Overall Cognitive Status: Within Functional Limits for tasks assessed                                        Exercises      General Comments        Pertinent Vitals/Pain Pain Assessment: 0-10 Pain Score: 6  Pain Location: neck and back  Pain Descriptors / Indicators: Aching;Sore Pain Intervention(s): Monitored during session    Home Living                      Prior Function            PT Goals (current goals can now be found in the care plan section) Acute Rehab PT Goals Patient  Stated Goal: "to be able to walk like I used to" PT Goal Formulation: With patient Time For Goal Achievement: 10/24/16 Potential to Achieve Goals: Fair Progress towards PT goals: Progressing toward goals    Frequency    Min 5X/week      PT Plan Current plan remains appropriate    Co-evaluation              AM-PAC PT "6 Clicks" Daily Activity  Outcome Measure  Difficulty turning over in bed (including adjusting bedclothes, sheets and blankets)?: A Little Difficulty moving from lying on back to  sitting on the side of the bed? : A Little Difficulty sitting down on and standing up from a chair with arms (e.g., wheelchair, bedside commode, etc,.)?: A Little Help needed moving to and from a bed to chair (including a wheelchair)?: A Little Help needed walking in hospital room?: A Little Help needed climbing 3-5 steps with a railing? : Total 6 Click Score: 16    End of Session Equipment Utilized During Treatment: Gait belt;Cervical collar Activity Tolerance: Patient tolerated treatment well;Patient limited by fatigue Patient left: in chair;with call bell/phone within reach;with chair alarm set Nurse Communication: Mobility status PT Visit Diagnosis: Unsteadiness on feet (R26.81);Repeated falls (R29.6)     Time: 0811-0828 PT Time Calculation (min) (ACUTE ONLY): 17 min  Charges:  $Gait Training: 8-22 mins                    G Codes:       Loma Sousa, SPT  [phone]   Loma Sousa 10/13/2016, 9:12 AM

## 2016-10-13 NOTE — Progress Notes (Signed)
Discharge to: [facility] Anticipated discharge date: 10/13/16 Family notified: Husband, by phone Transportation by: [facility]  Report #: [phone], pt will be in room 59B  [place] signing off.  Laveda Abbe LCSW [phone]

## 2016-10-15 DIAGNOSIS — M4802 Spinal stenosis, cervical region: Secondary | ICD-10-CM | POA: Diagnosis not present

## 2016-10-15 DIAGNOSIS — I10 Essential (primary) hypertension: Secondary | ICD-10-CM | POA: Diagnosis not present

## 2016-10-15 DIAGNOSIS — R05 Cough: Secondary | ICD-10-CM | POA: Diagnosis not present

## 2016-10-15 DIAGNOSIS — E1129 Type 2 diabetes mellitus with other diabetic kidney complication: Secondary | ICD-10-CM | POA: Diagnosis not present

## 2016-10-15 DIAGNOSIS — M79601 Pain in right arm: Secondary | ICD-10-CM | POA: Diagnosis not present

## 2016-10-16 ENCOUNTER — Ambulatory Visit: Payer: Medicare Other | Admitting: Family

## 2016-10-16 ENCOUNTER — Ambulatory Visit (HOSPITAL_COMMUNITY): Payer: Medicare Other

## 2016-10-22 ENCOUNTER — Non-Acute Institutional Stay (SKILLED_NURSING_FACILITY): Payer: Medicare Other | Admitting: Gerontology

## 2016-10-22 DIAGNOSIS — M19012 Primary osteoarthritis, left shoulder: Secondary | ICD-10-CM

## 2016-10-22 DIAGNOSIS — M19011 Primary osteoarthritis, right shoulder: Secondary | ICD-10-CM

## 2016-10-31 ENCOUNTER — Non-Acute Institutional Stay (SKILLED_NURSING_FACILITY): Payer: Medicare Other | Admitting: Gerontology

## 2016-10-31 DIAGNOSIS — M19011 Primary osteoarthritis, right shoulder: Secondary | ICD-10-CM | POA: Diagnosis not present

## 2016-10-31 DIAGNOSIS — M19012 Primary osteoarthritis, left shoulder: Secondary | ICD-10-CM

## 2016-10-31 DIAGNOSIS — M4802 Spinal stenosis, cervical region: Secondary | ICD-10-CM | POA: Diagnosis not present

## 2016-11-03 IMAGING — MR MRI LUMBAR SPINE WITHOUT CONTRAST
4 of 5 series · 13 of 48 positions shown · non-contrast
Comparison: None.

CLINICAL DATA: Status post fall 06/16/2014. Left leg pain and
tingling for 6-7 months.

EXAM:
MRI LUMBAR SPINE WITHOUT CONTRAST
TECHNIQUE: Multiplanar, multisequence MR imaging of the lumbar spine was
performed. No intravenous contrast was administered.

[Series 2: T2 · sagittal · 4.0mm · 0.36mm/px · 4 of 15 slices shown (1 of 2)]
[im 1/15]
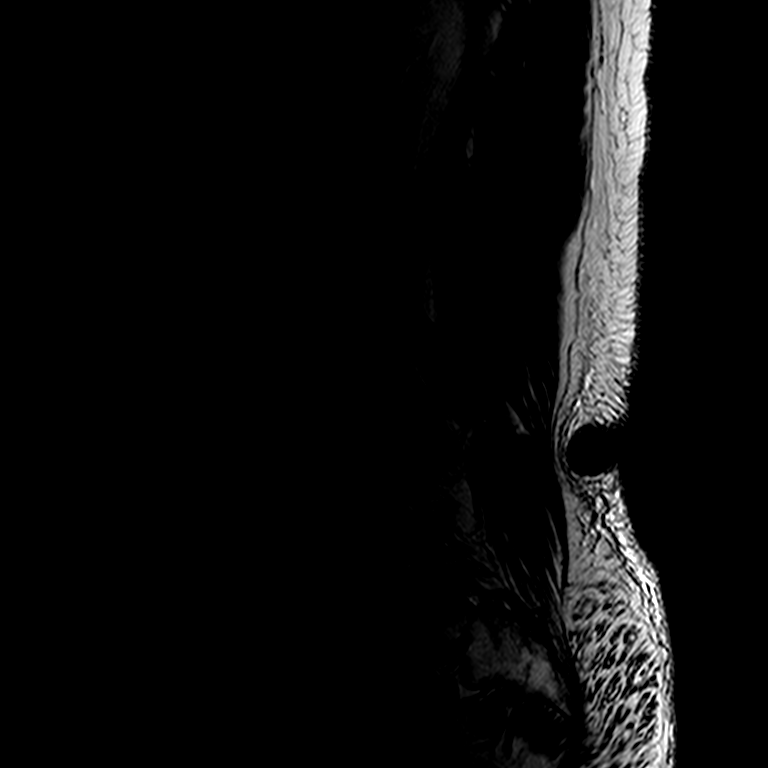
[im 3/15]
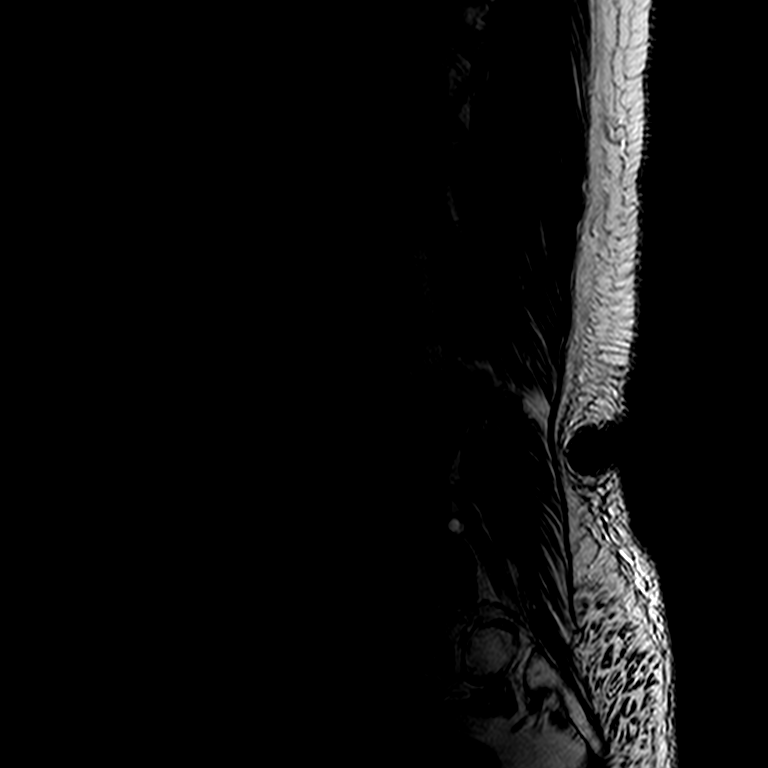
[im 9/15]
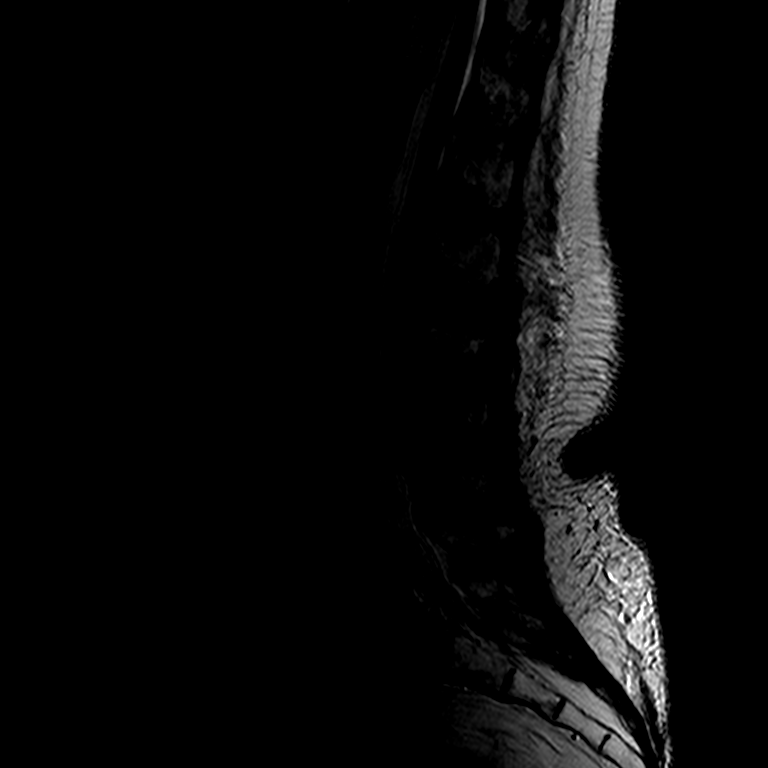
[im 15/15]
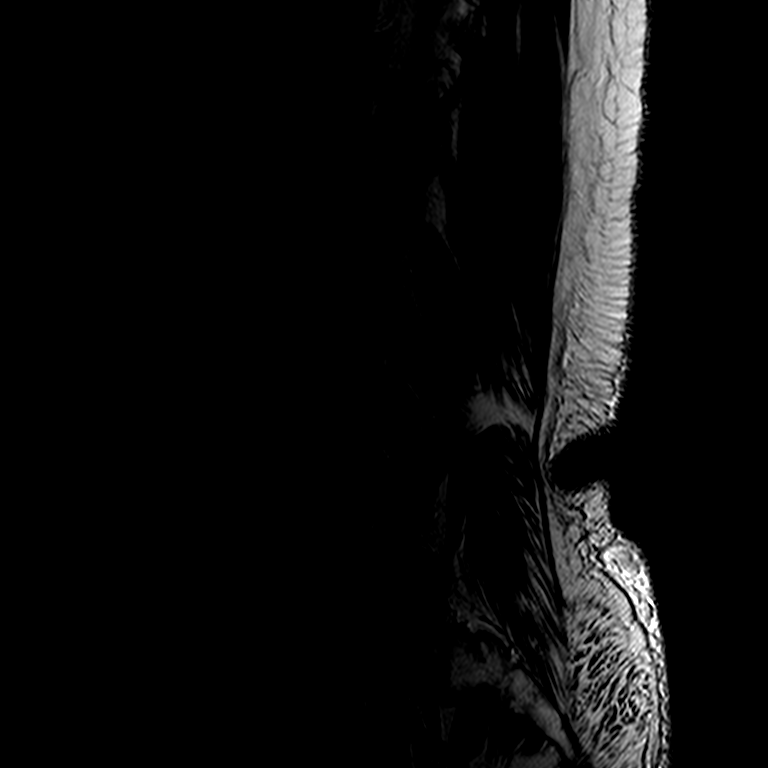

[Series 3: T1 · sagittal · 4.0mm · 0.44mm/px · 3 of 15 slices shown (1 of 2)]
[im 3/15]
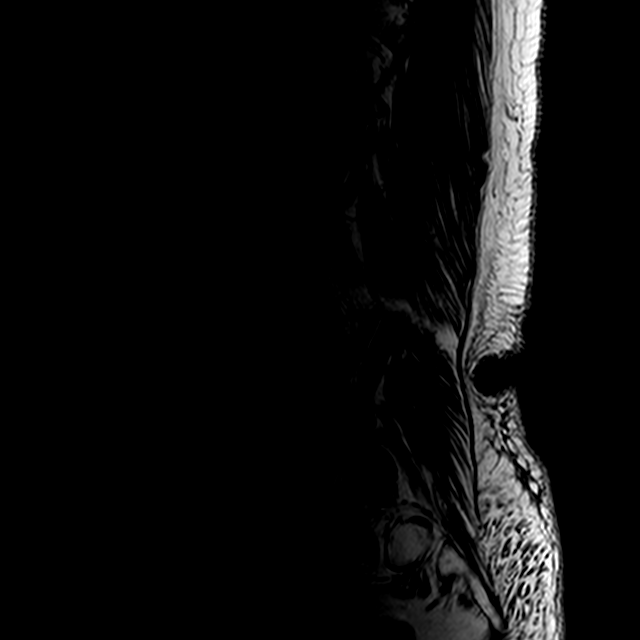
[im 8/15]
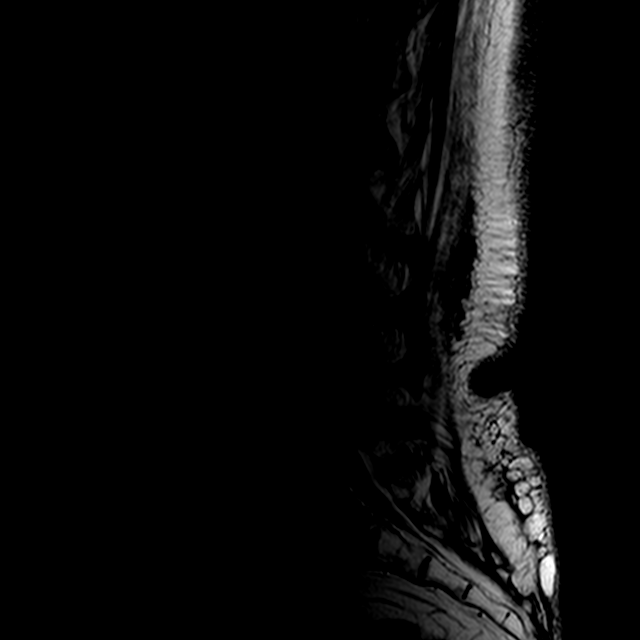
[im 12/15]
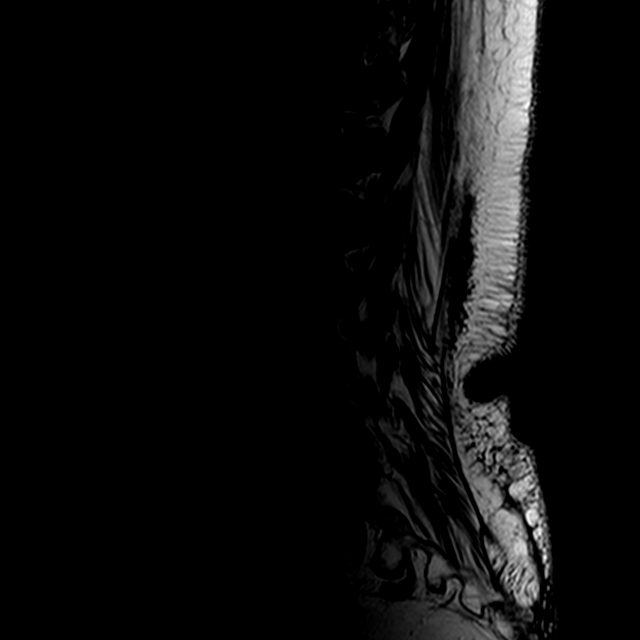

[Series 5: T2 · axial · 4.0mm · 0.39mm/px · z∈[-101,+31]mm · 3 of 30 slices shown (2 of 2)]
[im 5/30]
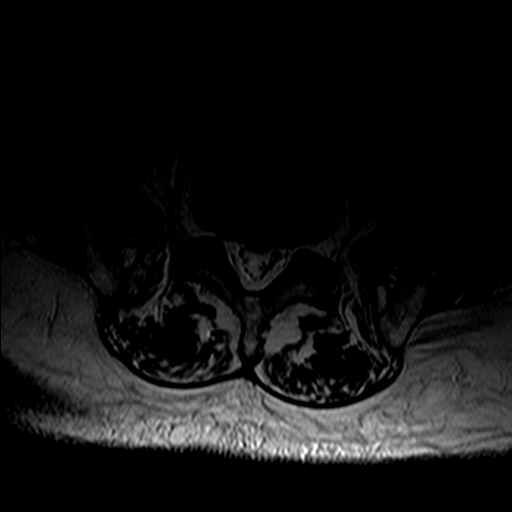
[im 16/30]
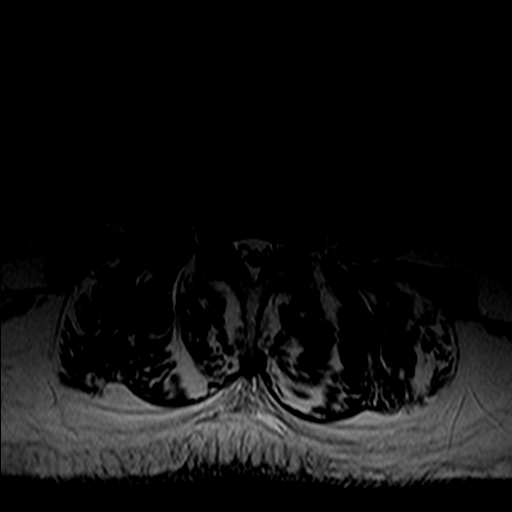
[im 25/30]
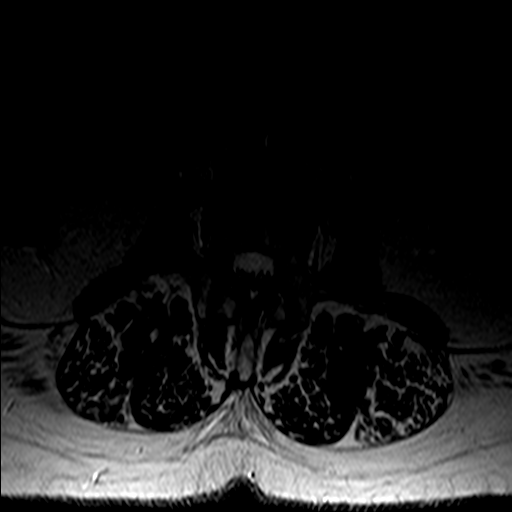

[Series 6: T1 · axial · 4.0mm · 0.39mm/px · z∈[-101,+31]mm · 3 of 30 slices shown (2 of 2)]
[im 5/30]
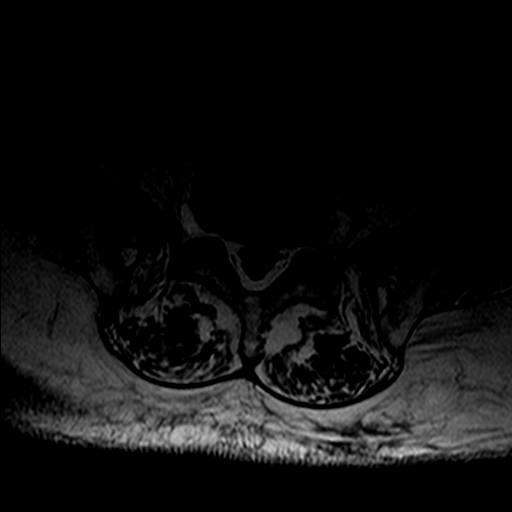
[im 16/30]
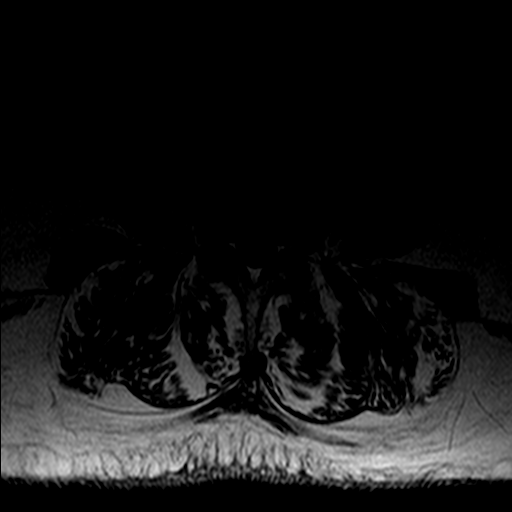
[im 25/30]
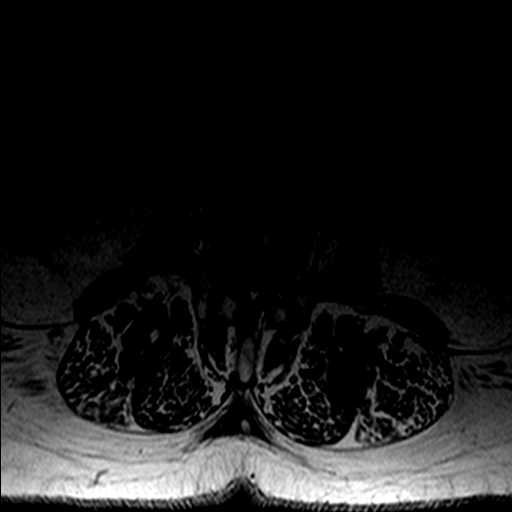

[13 of 48 positions shown; findings below may reference images not displayed]

FINDINGS: The vertebral bodies of the lumbar spine are normal in size. The
vertebral bodies of the lumbar spine are normal in alignment. There
is normal bone marrow signal demonstrated throughout the vertebra.
The intervertebral disc spaces are well-maintained.

The spinal cord is normal in signal and contour. The cord terminates
normally at L1 . The nerve roots of the cauda equina and the filum
terminale are normal.

The visualized portions of the SI joints are unremarkable.

The imaged intra-abdominal contents are unremarkable.

T11-12: Mild broad-based disc bulge.

T12-L1: No significant disc bulge. No evidence of neural foraminal
stenosis. No central canal stenosis.

L1-L2: No significant disc bulge. No evidence of neural foraminal
stenosis. No central canal stenosis.

L2-L3: Shallow left foraminal disc protrusion. Mild left foraminal
stenosis. No right foraminal stenosis. No central canal stenosis.
Mild bilateral facet arthropathy.

L3-L4: Mild broad-based disc bulge. Mild bilateral facet
arthropathy. No evidence of neural foraminal stenosis. No central
canal stenosis.

L4-L5: Mild broad-based disc bulge. Moderate bilateral facet
arthropathy with ligamentum flavum infolding. Mild left foraminal
stenosis. No right foraminal stenosis. No central canal stenosis.

L5-S1: Mild broad-based disc bulge. Mild bilateral facet
arthropathy. No evidence of neural foraminal stenosis. No central
canal stenosis.
IMPRESSION: 1. At L2-3 there is a shallow left foraminal disc protrusion. Mild
left foraminal stenosis. Mild bilateral facet arthropathy.
2. Mild broad-based disc bulge and bilateral facet arthropathy at
L3-4 and L5-S1.
3. At L4-5 there is a mild broad-based disc bulge. Moderate
bilateral facet arthropathy with ligamentum flavum infolding. Mild
left foraminal stenosis.

## 2016-11-04 ENCOUNTER — Telehealth: Payer: Self-pay

## 2016-11-04 DIAGNOSIS — Z4789 Encounter for other orthopedic aftercare: Secondary | ICD-10-CM | POA: Diagnosis not present

## 2016-11-04 DIAGNOSIS — I130 Hypertensive heart and chronic kidney disease with heart failure and stage 1 through stage 4 chronic kidney disease, or unspecified chronic kidney disease: Secondary | ICD-10-CM | POA: Diagnosis not present

## 2016-11-04 DIAGNOSIS — E1140 Type 2 diabetes mellitus with diabetic neuropathy, unspecified: Secondary | ICD-10-CM | POA: Diagnosis not present

## 2016-11-04 DIAGNOSIS — I5032 Chronic diastolic (congestive) heart failure: Secondary | ICD-10-CM | POA: Diagnosis not present

## 2016-11-04 DIAGNOSIS — E1151 Type 2 diabetes mellitus with diabetic peripheral angiopathy without gangrene: Secondary | ICD-10-CM | POA: Diagnosis not present

## 2016-11-04 DIAGNOSIS — M4802 Spinal stenosis, cervical region: Secondary | ICD-10-CM | POA: Diagnosis not present

## 2016-11-04 NOTE — Telephone Encounter (Signed)
Monday evening is fine

## 2016-11-04 NOTE — Telephone Encounter (Signed)
Both pt's husband and the Merry Proud the RN from [facility] called and stated that the pt needs a refill of xanax. Pt was just discharged from [facility] after having back surgery. Merry Proud, RN stated that Dr. Ouida Sills from the [facility] rehab ordered the xanax 0.5mg  one daily but the quantity was marked out so the pharmacy would not fill the rx. I explained to both of them that you have not prescribed this medication for the pt since June 2017 and that she would need an office visit in order for you to start prescribing the medication again. Told the husband that we probably wouldn't be able to get the pt in until next week sometime. Husband stated that pt has been out of medication for 1 week. Would you like for me to schedule the pt for sometime next week, all that is left is the Monday evening appts, or the 11:30am or 4:30pm appts.

## 2016-11-05 NOTE — Telephone Encounter (Signed)
The patient has been scheduled for 7.2.18 @ 4:30.

## 2016-11-05 NOTE — Telephone Encounter (Signed)
Spoke with pt's husband and informed him of the appt date and time. Husband repeated it back.

## 2016-11-05 NOTE — Telephone Encounter (Signed)
Could you schedule this pt in one of Dr. Lupita Dawn Monday evening appts on July 2nd and then I'll call the pt to let them know of the appt date and time. Thank you so much!

## 2016-11-07 DIAGNOSIS — I130 Hypertensive heart and chronic kidney disease with heart failure and stage 1 through stage 4 chronic kidney disease, or unspecified chronic kidney disease: Secondary | ICD-10-CM | POA: Diagnosis not present

## 2016-11-07 DIAGNOSIS — M4802 Spinal stenosis, cervical region: Secondary | ICD-10-CM | POA: Diagnosis not present

## 2016-11-07 DIAGNOSIS — Z4789 Encounter for other orthopedic aftercare: Secondary | ICD-10-CM | POA: Diagnosis not present

## 2016-11-07 DIAGNOSIS — E1151 Type 2 diabetes mellitus with diabetic peripheral angiopathy without gangrene: Secondary | ICD-10-CM | POA: Diagnosis not present

## 2016-11-07 DIAGNOSIS — E1140 Type 2 diabetes mellitus with diabetic neuropathy, unspecified: Secondary | ICD-10-CM | POA: Diagnosis not present

## 2016-11-07 DIAGNOSIS — I5032 Chronic diastolic (congestive) heart failure: Secondary | ICD-10-CM | POA: Diagnosis not present

## 2016-11-10 ENCOUNTER — Encounter: Payer: Self-pay | Admitting: Internal Medicine

## 2016-11-10 ENCOUNTER — Ambulatory Visit (INDEPENDENT_AMBULATORY_CARE_PROVIDER_SITE_OTHER): Payer: Medicare Other | Admitting: Internal Medicine

## 2016-11-10 VITALS — BP 112/50 | HR 74 | Temp 98.4°F | Resp 16 | Ht 67.0 in | Wt 186.2 lb

## 2016-11-10 DIAGNOSIS — E1165 Type 2 diabetes mellitus with hyperglycemia: Secondary | ICD-10-CM

## 2016-11-10 DIAGNOSIS — R079 Chest pain, unspecified: Secondary | ICD-10-CM | POA: Diagnosis not present

## 2016-11-10 DIAGNOSIS — IMO0002 Reserved for concepts with insufficient information to code with codable children: Secondary | ICD-10-CM

## 2016-11-10 DIAGNOSIS — M1990 Unspecified osteoarthritis, unspecified site: Secondary | ICD-10-CM | POA: Insufficient documentation

## 2016-11-10 DIAGNOSIS — E1121 Type 2 diabetes mellitus with diabetic nephropathy: Secondary | ICD-10-CM

## 2016-11-10 DIAGNOSIS — N183 Chronic kidney disease, stage 3 unspecified: Secondary | ICD-10-CM

## 2016-11-10 DIAGNOSIS — Z794 Long term (current) use of insulin: Secondary | ICD-10-CM | POA: Diagnosis not present

## 2016-11-10 DIAGNOSIS — F418 Other specified anxiety disorders: Secondary | ICD-10-CM

## 2016-11-10 DIAGNOSIS — Z9181 History of falling: Secondary | ICD-10-CM | POA: Diagnosis not present

## 2016-11-10 DIAGNOSIS — M5412 Radiculopathy, cervical region: Secondary | ICD-10-CM

## 2016-11-10 DIAGNOSIS — I255 Ischemic cardiomyopathy: Secondary | ICD-10-CM

## 2016-11-10 MED ORDER — ALPRAZOLAM 0.25 MG PO TABS: 0.2500 mg | ORAL_TABLET | Freq: Every day | ORAL | 2 refills | Status: DC

## 2016-11-10 NOTE — Progress Notes (Signed)
Location:      Place of Service:    Provider:  Toni Arthurs, NP-C  [name], MD  Patient Care Team: [name], MD as PCP - General (Internal Medicine) Juanito Doom, MD as Consulting Physician (Pulmonary Disease) Minna Merritts, MD as Consulting Physician (Cardiology) [name], MD (Internal Medicine) Bary Castilla Forest Gleason, MD (General Surgery)  Extended Emergency Contact Information Primary Emergency Contact: Malstrom,Clyde Address: [address]          [place], [place] [address] Johnnette Litter of [place] Phone: [phone] Mobile Phone: [phone] Relation: Spouse Secondary Emergency Contact: Rhona Raider [place] of [place] Phone: [phone] Relation: Relative  Code Status:  FULL Goals of care: Advanced Directive information Advanced Directives 09/22/2016  Does Patient Have a Medical Advance Directive? Yes  Type of Advance Directive -  Does patient want to make changes to medical advance directive? -  Copy of [place] in Chart? -  Would patient like information on creating a medical advance directive? -     Chief Complaint  Patient presents with  . Follow-up    HPI:  Pt is a 76 y.o. female seen today for a follow up  visit for shoulder pain and cervical canal stenosis with surgical intervention. Pt  Sustained a fall last week and hit the left shoulder. She now is c/o pain in that shoulder. Xrays obtained, no acute findings. 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 She c/o anterior shoulder with front arm raise. No crepitus. No visual abnormality. No bruising, no redness or warmth. No increased pain in the neck/ C-spine. Started on Voltaren gel. No new c/o of on-going pain. Pt reports shoulder is feeling better. Pt has been working with therapy for the cervical stenosis. She reports the pain is well controlled on the pain medications. Incision well approximated. No redness, no drainage, no warmth. Pt  reports appetite is good, having regular BMs. VSS. No other complaints.     Past Medical History:  Diagnosis Date  . Acute respiratory failure ([place]) 11/21/2014  . Anxiety   . Arthritis 08/05/2013  . Atherosclerotic peripheral vascular disease with gangrene [facility]) august 2012  . Cardiomyopathy, ischemic    a. EF 35 to 40% by echo in 2013 b. EF improved to 50-55% by echo in 04/2015.  [name] Cerebral infarct ([place]) 08/17/2013  . Chronic diastolic CHF (congestive heart failure) ([facility])    a. EF 50-55% by echo in 04/2015  . COPD (chronic obstructive pulmonary disease) ([place])   . Coronary artery disease, occlusive    a. Previous PCI to the LAD, LCx, and RCA in 2010, 2011, 2013, and 2016. All performed in [place].  . Depression   . 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 Diabetic diarrhea ([place]) 10/03/2014  . DM type 2, uncontrolled, with renal complications ([place]) 08/11/3242  . Hepatic steatosis    by CT abd pelvis  . History of kidney stones    at a younger age  . Hyperlipidemia LDL goal <100 02/23/2014  . Hypertensive heart disease   . Osteoporosis, post-menopausal   . 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 Pleural effusion 10/25/2012   10/2012 CT chest >> small to moderate R lung effusion>> chylothorax, 100% lymphs 10/2013 thoracentesis> cytology negative, WBC 1471, > 90% "small lymphs" 01/2014 CT chest> near complete resolution of pleural effusion, stable lymphadenopathy 08/2014 CT chest [place] (  [facility]): small right sided effusion decreased in size, stable mediastinal lymphadenopathy 1.0cm largest, 40m  . 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 Renal insufficiency   .  Sarcoidosis   . Sleep apnea    Past Surgical History:  Procedure Laterality Date  . ABDOMINAL HYSTERECTOMY     at ge 39. secondary to bleeding/partial  . ANTERIOR CERVICAL DECOMP/DISCECTOMY FUSION N/A 10/10/2016   Procedure: Anterior Cervical Discectomy Fusion - Cervical four4- five - Cervical five-Cervical six - Cervical six-Cervical seven;  Surgeon: Earnie Larsson, MD;  Location: [facility];  Service: Neurosurgery;  Laterality: N/A;  . BREAST CYST ASPIRATION Right   . CARPAL TUNNEL RELEASE Bilateral   . CHOLECYSTECTOMY     in [place]   . COLONOSCOPY WITH PROPOFOL N/A 01/09/2015   Procedure: COLONOSCOPY WITH PROPOFOL;  Surgeon: Lucilla Lame, MD;  Location: [facility] ENDOSCOPY;  Service: Endoscopy;  Laterality: N/A;  . CORONARY ANGIOPLASTY WITH STENT PLACEMENT     [place]; [facility]  . EYE SURGERY     bil cataracts  . HERNIA REPAIR     umbilical/ Dr Pat Patrick  . PTCA  August 2012   Right Posterior tibial artery , [place]  . TOE AMPUTATION  Sept 2012   Right 2nd toe, [place]    Allergies  Allergen Reactions  . Cephalexin Itching  . Contrast Media [Iodinated Diagnostic Agents] Itching  . Doxycycline Itching  . Gabapentin Itching  . Sulfa Antibiotics Itching    Allergies as of 10/31/2016      Reactions   Cephalexin Itching   Contrast Media [iodinated Diagnostic Agents] Itching   Doxycycline Itching   Gabapentin Itching   Sulfa Antibiotics Itching      Medication List       Accurate as of 10/31/16 11:59 PM. Always use your most recent med list.          acetaminophen 500 MG tablet Commonly known as:  TYLENOL Take 1,000 mg by mouth every 6 (six) hours as needed for mild pain or headache.   albuterol 108 (90 Base) MCG/ACT inhaler Commonly known as:  PROAIR HFA Inhale 2 puffs into the lungs every 6 (six) hours as needed for wheezing or shortness of breath.   albuterol (2.5 MG/3ML) 0.083% nebulizer solution Commonly known as:  PROVENTIL Take 3 mLs (2.5 mg total)  by nebulization every 6 (six) hours as needed for wheezing or shortness of breath.   alendronate 70 MG tablet Commonly known as:  FOSAMAX Take 1 tablet (70 mg total) by mouth once a week. Pt takes on Sunday.   Take with a full glass of water on an empty stomach.   ALPRAZolam 0.5 MG tablet Commonly known as:  XANAX Take 1 tablet (0.5 mg total) by mouth daily.   aspirin EC 81 MG tablet Take 81 mg by mouth daily.   B-D UF III MINI PEN NEEDLES 31G X 5 MM Misc Generic drug:  Insulin Pen Needle USE THREE TIMES A DAY   BRILINTA 90 MG Tabs tablet Generic drug:  ticagrelor TAKE 1 TABLET TWICE A DAY   budesonide-formoterol 160-4.5 MCG/ACT inhaler Commonly known as:  SYMBICORT Inhale 2 puffs into the lungs 2 (two) times daily.  cyclobenzaprine 10 MG tablet Commonly known as:  FLEXERIL Take 1 tablet (10 mg total) by mouth 3 (three) times daily as needed for muscle spasms.   docusate sodium 100 MG capsule Commonly known as:  COLACE Take 100 mg by mouth 2 (two) times daily as needed for mild constipation. Reported on 11/09/2015   fluticasone 50 MCG/ACT nasal spray Commonly known as:  FLONASE Place 2 sprays into both nostrils daily as needed for rhinitis.   furosemide 20 MG tablet Commonly known as:  LASIX Take 1 tablet (20 mg total) by mouth daily.   glucose blood test strip Commonly known as:  ACCU-CHEK AVIVA PLUS USE TO CHECK GLUCOSE  THREE TIMES DAILY   HYDROcodone-acetaminophen 5-325 MG tablet Commonly known as:  NORCO/VICODIN Take 1-2 tablets by mouth every 4 (four) hours as needed (breakthrough pain).   Insulin Lispro Prot & Lispro (75-25) 100 UNIT/ML Kwikpen Commonly known as:  HUMALOG MIX 75/25 KWIKPEN Inject 45 to 60 units twice daily before morning and evening meals   isosorbide mononitrate 30 MG 24 hr tablet Commonly known as:  IMDUR TAKE 1 TABLET DAILY   losartan 100 MG tablet Commonly known as:  COZAAR TAKE 1 TABLET DAILY   metoprolol succinate 25 MG 24 hr  tablet Commonly known as:  TOPROL-XL Take 1 tablet (25 mg total) by mouth daily.   nitroGLYCERIN 0.4 MG SL tablet Commonly known as:  NITROSTAT Place 1 tablet (0.4 mg total) under the tongue every 5 (five) minutes as needed for chest pain.   ondansetron 4 MG tablet Commonly known as:  ZOFRAN Take 1 tablet (4 mg total) by mouth every 8 (eight) hours as needed for nausea or vomiting.   PARoxetine 20 MG tablet Commonly known as:  PAXIL TAKE ONE TABLET BY MOUTH ONCE DAILY   polyethylene glycol packet Commonly known as:  MIRALAX / GLYCOLAX Take 17 g by mouth daily as needed for mild constipation. Reported on 10/25/2015   pregabalin 100 MG capsule Commonly known as:  LYRICA Take 1 capsule (100 mg total) by mouth 3 (three) times daily.   rosuvastatin 20 MG tablet Commonly known as:  CRESTOR Take 20 mg by mouth daily.       Review of Systems  Constitutional: Negative for activity change, appetite change, fatigue and unexpected weight change.  HENT: Negative for congestion and hearing loss.   Eyes: Negative.   Respiratory: Negative for cough and shortness of breath.   Cardiovascular: Negative for chest pain, palpitations and leg swelling.  Gastrointestinal: Negative for abdominal pain, constipation and diarrhea.  Genitourinary: Negative for difficulty urinating and dysuria.  Musculoskeletal: Positive for arthralgias, myalgias and neck pain.  Skin: Negative for color change and wound.  Neurological: Negative for dizziness, syncope, weakness, light-headedness and numbness.  Psychiatric/Behavioral: Negative for agitation, behavioral problems and confusion.    Immunization History  Administered Date(s) Administered  . Influenza Split 03/23/2012  . Influenza, High Dose Seasonal PF 01/15/2016  . Influenza,inj,Quad PF,36+ Mos 02/03/2013, 02/20/2014, 02/21/2015  . Influenza-Unspecified 04/22/2015  . PPD Test 04/26/2015, 05/07/2015  . Pneumococcal Conjugate-13 07/01/2013  .  Pneumococcal Polysaccharide-23 02/26/2012  . Pneumococcal-Unspecified 04/22/2015   Pertinent  Health Maintenance Due  Topic Date Due  . OPHTHALMOLOGY EXAM  08/01/2016  . FOOT EXAM  09/25/2016  . INFLUENZA VACCINE  12/10/2016  . HEMOGLOBIN A1C  04/10/2017  . DEXA SCAN  Completed  . PNA vac Low Risk Adult  Completed   Fall Risk  07/01/2016 02/12/2016 12/03/2015 10/10/2015 07/16/2015  Falls in  the past year? Yes No Yes Yes -  Number falls in past yr: 1 - 2 or more 1 -  Injury with Fall? No - No Yes -  Risk Factor Category  - - High Fall Risk - -  Risk for fall due to : Impaired balance/gait - History of fall(s);Other (Comment) Impaired mobility Impaired balance/gait  Risk for fall due to (comments): - - occasional episodes of dizziness, lightheaded feeling use cane  -  Follow up Falls prevention discussed;Education provided - Education provided Falls prevention discussed -   Functional Status Survey:    Vitals:   10/31/16 2040  BP: (!) 94/41  Pulse: (!) 56  Resp: 16  Temp: 97.5 F (36.4 C)  SpO2: 97%   There is no height or weight on file to calculate BMI. Physical Exam  Constitutional: She is oriented to person, place, and time. Vital signs are normal. She appears well-developed and well-nourished. She is active and cooperative. She does not appear ill. No distress.  HENT:  Head: Normocephalic and atraumatic.  Mouth/Throat: Uvula is midline, oropharynx is clear and moist and mucous membranes are normal. Mucous membranes are not pale, not dry and not cyanotic.  Eyes: Conjunctivae, EOM and lids are normal. Pupils are equal, round, and reactive to light.  Neck: Trachea normal, normal range of motion and full passive range of motion without pain. Neck supple. No JVD present. No tracheal deviation, no edema and no erythema present. No thyromegaly present.  Cardiovascular: Normal rate, regular rhythm, normal heart sounds, intact distal pulses and normal pulses.  Exam reveals no gallop, no  distant heart sounds and no friction rub.   No murmur heard. Pulses:      Radial pulses are 2+ on the right side, and 2+ on the left side.  Pulmonary/Chest: Effort normal and breath sounds normal. No accessory muscle usage. No respiratory distress. She has no wheezes. She has no rales. She exhibits no tenderness.  Abdominal: Normal appearance and bowel sounds are normal. She exhibits no distension and no ascites. There is no tenderness.  Musculoskeletal: She exhibits no edema or tenderness.       Left shoulder: She exhibits pain. She exhibits no swelling, no effusion, no crepitus, no deformity and normal strength.       Cervical back: She exhibits decreased range of motion, laceration (incision) and pain (controlled).  Expected osteoarthritis, stiffness; Calves soft, supple. Negative Homan's sign; BUE equal grips, strength. Full ROM  Neurological: She is alert and oriented to person, place, and time. She has normal strength.  Skin: Skin is warm, dry and intact. No rash noted. She is not diaphoretic. No cyanosis or erythema. No pallor. Nails show no clubbing.  Psychiatric: She has a normal mood and affect. Her speech is normal and behavior is normal. Judgment and thought content normal. Cognition and memory are normal.  Nursing note and vitals reviewed.   Labs reviewed:  Recent Labs  06/11/16 1236 06/29/16 1038 10/08/16 1546  NA 139 139 136  K 3.7 4.2 4.5  CL 107 108 103  CO2 27 25 27   GLUCOSE 107* 155* 132*  BUN 12 14 25*  CREATININE 1.11* 1.05* 1.25*  CALCIUM 8.6* 8.8* 9.9    Recent Labs  03/14/16 1452 06/29/16 1038  AST 16 18  ALT 12 14  ALKPHOS 52 45  BILITOT 0.8 0.9  PROT 7.4 7.0  ALBUMIN 3.8 3.3*    Recent Labs  05/16/16 1336 05/17/16 2316 06/11/16 1236 06/29/16 1038 10/08/16 1546  WBC 6.7 6.3 6.0 7.1 7.0  NEUTROABS 4.9 5.4  --   --  4.5  HGB 11.6* 12.1 10.9* 11.1* 11.7*  HCT 34.7* 35.0 32.4* 33.1* 36.8  MCV 86.3 85.7 85.7 86.2 90.2  PLT 403.0* 373 335  297 286   Lab Results  Component Value Date   TSH 1.16 07/01/2013   Lab Results  Component Value Date   HGBA1C 7.5 (H) 10/08/2016   Lab Results  Component Value Date   CHOL 145 06/18/2016   HDL 47.00 06/18/2016   LDLCALC 66 06/18/2016   LDLDIRECT 102.0 03/14/2016   TRIG 164.0 (H) 06/18/2016   CHOLHDL 3 06/18/2016    Significant Diagnostic Results in last 30 days:  No results found.  Assessment/Plan 1. Cervical stenosis of spinal canal  Continue PT/OT  Soft neck collar  Norco prn for pain control  Ice prn  Dressing changes per protocol   F/U with Neurosurgeon as instructed  2. Osteoarthritis of both shoulders, unspecified osteoarthritis type  Voltaren gel 1% 2 grams QID to Both shoulders   Continue Norco 5/325 mg 1-2 tablet po Q 4 hours prn pain  Ice prn  Family/ staff Communication:   Total Time:  Documentation:  Face to Face:  Family/Phone:   Labs/tests ordered:  Left shoulder complete view xrays  Medication list reviewed and assessed for continued appropriateness.  Vikki Ports, NP-C Geriatrics [facility] [phone], [place] [address] Cell Phone (Mon-Fri 8am-5pm):  [phone] On Call:  [phone] & follow prompts after 5pm & weekends Office Phone:  [phone] Office Fax:  [phone]

## 2016-11-10 NOTE — Patient Instructions (Addendum)
The hydrocodone should be cut in half  For pain  control to minmize the side effects of sedation and dizziness.   If it still causes those symptoms ,  Call for alternative (tramadol)   I have reduced your alprazolam dose to 0.25 mg while you are taking pain medication  We do not know who your home health agency is,  But we can get you a bench that straddles the tub side so you do not have to climb over it

## 2016-11-10 NOTE — Progress Notes (Signed)
Subjective:  Patient ID: Pamela Collier, female    DOB: 13-Oct-1940  Age: 76 y.o. MRN: [id number]  CC: The primary encounter diagnosis was History of fall. Diagnoses of Chronic renal impairment, stage 3 (moderate), Chest pain with low risk of acute coronary syndrome, Cervical radiculopathy at C7, Depression with anxiety, and Uncontrolled type 2 diabetes mellitus with diabetic nephropathy, with long-term current use of insulin ([place]) were also pertinent to this visit.  HPI Pamela Collier presents for follow up on chronic issues.  Last seen in February. Referred to Neurosurgery for cervical spinal stenosis and underwent 3 level anterior cervical decompression/disckectomy with fusion by Deri Fuelling on June 1 . Discharged on June 4 to skilled nursing at [place],  [place] home  one week ago.  Has not had home PT except one time Cut short because she was too out of breath.  Not sure which agency is coming to the house.  Accompanied by husband today  Was treated for left shoulder pain following a fall which occurred in mid June., prior to coming hone .  Fortunately,  the neck was not re injured.  She was treated with voltaren gel with good results .Left shoulder and upper arm still ache, and she  has been having sharp chest pains intermittently  Started occurring after she was discharged home. The chest pains occur while lying down, not while using arms or exerting himself  .   Taking voltaren gel and hydrocodone but the narcotic is making  HER FEEL DRUNK AND OFF BALANCE    Anxiety:  Has been out of alprazolam for one week . Husband notes that when she takes the medication she is more forgetful.  She takes it "when somebody is bothering me (implies that the cause of her aggravation is her  husband). Ongoing marital discord, discussed privately at previous visit. Patient has complained in past visits about husband not being very supportive.   Taking voltaren gel and hydrocodone but the narcotic is making   HER FEEL DRUNK AND OFF BALANCE   Appetite poor,  Not eating a balanced diet,  Does not have any fresh or frozen vegetables on a daily basis. But advised to avoid dairy due to diarrhea.  Tried Lactaid,  Still had diarrhea    sure which home health agency is supposed to be coming out,  Never left any paperwork.   patient is fearful  of getting into the tub due to dizziness and loss of balance. ,   Lab Results  Component Value Date   HGBA1C 7.5 (H) 10/08/2016      Lab Results  Component Value Date   CREATININE 1.25 (H) 10/08/2016      Outpatient Medications Prior to Visit  Medication Sig Dispense Refill  . acetaminophen (TYLENOL) 500 MG tablet Take 1,000 mg by mouth every 6 (six) hours as needed for mild pain or headache.     . alendronate (FOSAMAX) 70 MG tablet Take 1 tablet (70 mg total) by mouth once a week. Pt takes on Sunday.   Take with a full glass of water on an empty stomach. 4 tablet 11  . aspirin EC 81 MG tablet Take 81 mg by mouth daily.    [name] BRILINTA 90 MG TABS tablet TAKE 1 TABLET TWICE A DAY 180 tablet 1  . budesonide-formoterol (SYMBICORT) 160-4.5 MCG/ACT inhaler Inhale 2 puffs into the lungs 2 (two) times daily. 1 Inhaler 0  . docusate sodium (COLACE) 100 MG capsule Take 100 mg by mouth  2 (two) times daily as needed for mild constipation. Reported on 11/09/2015    . fluticasone (FLONASE) 50 MCG/ACT nasal spray Place 2 sprays into both nostrils daily as needed for rhinitis.    . furosemide (LASIX) 20 MG tablet Take 1 tablet (20 mg total) by mouth daily. (Patient taking differently: Take 20 mg by mouth every other day. ) 90 tablet 1  . glucose blood (ACCU-CHEK AVIVA PLUS) test strip USE TO CHECK GLUCOSE  THREE TIMES DAILY 300 each 3  . HYDROcodone-acetaminophen (NORCO/VICODIN) 5-325 MG tablet Take 1-2 tablets by mouth every 4 (four) hours as needed (breakthrough pain). 30 tablet 0  . Insulin Lispro Prot & Lispro (HUMALOG MIX 75/25 KWIKPEN) (75-25) 100 UNIT/ML Kwikpen  Inject 45 to 60 units twice daily before morning and evening meals (Patient taking differently: Inject 44 units in the morning and 60 units in the evening) 112 mL 3  . isosorbide mononitrate (IMDUR) 30 MG 24 hr tablet TAKE 1 TABLET DAILY 90 tablet 1  . losartan (COZAAR) 100 MG tablet TAKE 1 TABLET DAILY 90 tablet 0  . metoprolol succinate (TOPROL-XL) 25 MG 24 hr tablet Take 1 tablet (25 mg total) by mouth daily. 90 tablet 3  . nitroGLYCERIN (NITROSTAT) 0.4 MG SL tablet Place 1 tablet (0.4 mg total) under the tongue every 5 (five) minutes as needed for chest pain. 25 tablet 3  . ondansetron (ZOFRAN) 4 MG tablet Take 1 tablet (4 mg total) by mouth every 8 (eight) hours as needed for nausea or vomiting. 20 tablet 0  . PARoxetine (PAXIL) 20 MG tablet TAKE ONE TABLET BY MOUTH ONCE DAILY 90 tablet 1  . polyethylene glycol (MIRALAX / GLYCOLAX) packet Take 17 g by mouth daily as needed for mild constipation. Reported on 10/25/2015    . pregabalin (LYRICA) 100 MG capsule Take 1 capsule (100 mg total) by mouth 3 (three) times daily. (Patient taking differently: Take 100 mg by mouth 2 (two) times daily. ) 270 capsule 2  . rosuvastatin (CRESTOR) 20 MG tablet Take 20 mg by mouth daily.     [name] ALPRAZolam (XANAX) 0.5 MG tablet Take 1 tablet (0.5 mg total) by mouth daily. 30 tablet 0  . B-D UF III MINI PEN NEEDLES 31G X 5 MM MISC USE THREE TIMES A DAY 270 each 3  . albuterol (PROAIR HFA) 108 (90 Base) MCG/ACT inhaler Inhale 2 puffs into the lungs every 6 (six) hours as needed for wheezing or shortness of breath. 1 Inhaler 2  . albuterol (PROVENTIL) (2.5 MG/3ML) 0.083% nebulizer solution Take 3 mLs (2.5 mg total) by nebulization every 6 (six) hours as needed for wheezing or shortness of breath. 150 mL 1  . cyclobenzaprine (FLEXERIL) 10 MG tablet Take 1 tablet (10 mg total) by mouth 3 (three) times daily as needed for muscle spasms. 30 tablet 0   Facility-Administered Medications Prior to Visit  Medication Dose Route  Frequency Provider Last Rate Last Dose  . albuterol (PROVENTIL) (5 MG/ML) 0.5% nebulizer solution 2.5 mg  2.5 mg Nebulization Once [name], MD        Review of Systems;  Patient denies headache, fevers, malaise, unintentional weight loss, skin rash, eye pain, sinus congestion and sinus pain, sore throat, dysphagia,  hemoptysis , cough, dyspnea, wheezing, chest pain, palpitations, orthopnea, edema, abdominal pain, nausea, melena, diarrhea, constipation, flank pain, dysuria, hematuria, urinary  Frequency, nocturia, numbness, tingling, seizures,  Focal weakness, Loss of consciousness,  Tremor, insomnia, depression, anxiety, and suicidal ideation.  Objective:  BP (!) 112/50   Pulse 74   Temp 98.4 F (36.9 C) (Oral)   Resp 16   Ht 5\' 7"  (1.702 m)   Wt 186 lb 3.2 oz (84.5 kg)   SpO2 98%   BMI 29.16 kg/m   BP Readings from Last 3 Encounters:  11/10/16 (!) 112/50  10/31/16 (!) 94/41  10/22/16 136/67    Wt Readings from Last 3 Encounters:  11/10/16 186 lb 3.2 oz (84.5 kg)  10/22/16 184 lb 3.2 oz (83.6 kg)  10/11/16 198 lb 6.6 oz (90 kg)    General appearance: alert, cooperative and appears stated age Ears: normal TM's and external ear canals both ears Throat: lips, mucosa, and tongue normal; teeth and gums normal Neck: no adenopathy, no carotid bruit, supple, symmetrical, trachea midline and thyroid not enlarged, symmetric, no tenderness/mass/nodules Back: symmetric, no curvature. ROM normal. No CVA tenderness. Lungs: clear to auscultation bilaterally Heart: regular rate and rhythm, S1, S2 normal, no murmur, click, rub or gallop Abdomen: soft, non-tender; bowel sounds normal; no masses,  no organomegaly Pulses: 2+ and symmetric Skin: Skin color, texture, turgor normal. No rashes or lesions Lymph nodes: Cervical, supraclavicular, and axillary nodes normal.  Lab Results  Component Value Date   HGBA1C 7.5 (H) 10/08/2016   HGBA1C 7.1 06/18/2016   HGBA1C 7.6 (H)  03/14/2016    Lab Results  Component Value Date   CREATININE 1.25 (H) 10/08/2016   CREATININE 1.05 (H) 06/29/2016   CREATININE 1.11 (H) 06/11/2016    Lab Results  Component Value Date   WBC 7.0 10/08/2016   HGB 11.7 (L) 10/08/2016   HCT 36.8 10/08/2016   PLT 286 10/08/2016   GLUCOSE 132 (H) 10/08/2016   CHOL 145 06/18/2016   TRIG 164.0 (H) 06/18/2016   HDL 47.00 06/18/2016   LDLDIRECT 102.0 03/14/2016   LDLCALC 66 06/18/2016   ALT 14 06/29/2016   AST 18 06/29/2016   NA 136 10/08/2016   K 4.5 10/08/2016   CL 103 10/08/2016   CREATININE 1.25 (H) 10/08/2016   BUN 25 (H) 10/08/2016   CO2 27 10/08/2016   TSH 1.16 07/01/2013   INR 1.2 (H) 02/02/2015   HGBA1C 7.5 (H) 10/08/2016   MICROALBUR 28.9 (H) 09/26/2015    No results found.  Assessment & Plan:   Problem List Items Addressed This Visit    DM (diabetes mellitus), type 2, uncontrolled, with renal complications ([place])    Previously  well-controlled on current medications.  Patient has increased her dose of 70/30 insulin to 40 units in the morning and 60 units before dinner.  She   is up-to-date on eye exams and foot exam is normal today. Patient is due for urine microalbumin to creatinine ratio at next visit. Patient is tolerating statin therapy for CAD risk reduction and on ACE/ARB for reduction in proteinuria.  Lab Results  Component Value Date   HGBA1C 7.5 (H) 10/08/2016   Lab Results  Component Value Date   MICROALBUR 28.9 (H) 09/26/2015          Depression with anxiety    Dose of alprazolam reduced due to observations of AMS by husband.       Chest pain with low risk of acute coronary syndrome    Her current chest pain complaints are non cardiac and related to her neck surgery and shoulder pain       Chronic renal insufficiency    Renal function is back to baseline  Lab Results  Component Value  Date   CREATININE 1.25 (H) 10/08/2016   Lab Results  Component Value Date   NA 136 10/08/2016   K  4.5 10/08/2016   CL 103 10/08/2016   CO2 27 10/08/2016         Cervical radiculopathy at C7    S.p anterior decompression /diskectomy,  3 level on June 1 by Deri Fuelling.       Relevant Medications   ALPRAZolam (XANAX) 0.25 MG tablet   History of fall - Primary    She has impaired balance and needs a bench to straddle her tub side to prevent falls       Relevant Orders   DME Other see comment      I have discontinued Ms. Yingst B-D UF III MINI PEN NEEDLES, albuterol, albuterol, and cyclobenzaprine. I have also changed her ALPRAZolam. Additionally, I am having her maintain her budesonide-formoterol, nitroGLYCERIN, acetaminophen, docusate sodium, fluticasone, rosuvastatin, polyethylene glycol, aspirin EC, Insulin Lispro Prot & Lispro, glucose blood, pregabalin, ondansetron, furosemide, metoprolol succinate, PARoxetine, BRILINTA, alendronate, isosorbide mononitrate, losartan, HYDROcodone-acetaminophen, and diclofenac sodium.  Meds ordered this encounter  Medications  . diclofenac sodium (VOLTAREN) 1 % GEL    Sig: Apply 2 g topically 4 (four) times daily.  [name] ALPRAZolam (XANAX) 0.25 MG tablet    Sig: Take 1 tablet (0.25 mg total) by mouth daily.    Dispense:  30 tablet    Refill:  2    Medications Discontinued During This Encounter  Medication Reason  . albuterol (PROAIR HFA) 108 (90 Base) MCG/ACT inhaler Patient Discharge  . albuterol (PROVENTIL) (2.5 MG/3ML) 0.083% nebulizer solution Patient Discharge  . B-D UF III MINI PEN NEEDLES 31G X 5 MM MISC Change in therapy  . cyclobenzaprine (FLEXERIL) 10 MG tablet No longer needed (for PRN medications)  . ALPRAZolam (XANAX) 0.5 MG tablet Reorder    Follow-up: Return in about 2 months (around 01/11/2017) for follow up diabetes.   [name], MD

## 2016-11-10 NOTE — Progress Notes (Signed)
Location:      Place of Service:  SNF (31) Provider:  Toni Arthurs, NP-C  [name], MD  Patient Care Team: [name], MD as PCP - General (Internal Medicine) Juanito Doom, MD as Consulting Physician (Pulmonary Disease) Minna Merritts, MD as Consulting Physician (Cardiology) [name], MD (Internal Medicine) Bary Castilla Forest Gleason, MD (General Surgery)  Extended Emergency Contact Information Primary Emergency Contact: Mckay,Clyde Address: [address]          [place], [place] [address] Johnnette Litter of [place] Phone: [phone] Mobile Phone: [phone] Relation: Spouse Secondary Emergency Contact: Rhona Raider [place] of [place] Phone: [phone] Relation: Relative  Code Status:  FULL Goals of care: Advanced Directive information Advanced Directives 09/22/2016  Does Patient Have a Medical Advance Directive? Yes  Type of Advance Directive -  Does patient want to make changes to medical advance directive? -  Copy of [place] in Chart? -  Would patient like information on creating a medical advance directive? -     Chief Complaint  Patient presents with  . Acute Visit    HPI:  Pt is a 76 y.o. female seen today for an acute visit for shoulder pain. Pt  Sustained a fall last evening and hit the left shoulder. She now is c/o pain in that shoulder. Xrays obtained, no acute findings. 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 She c/o anterior shoulder with front arm raise. No crepitus. No visual abnormality. No bruising, no redness or warmth. No increased pain in the neck/ C-spine. VSS. No other complaints.     Past Medical History:  Diagnosis Date  . Acute respiratory failure ([place]) 11/21/2014  . Anxiety   . Arthritis 08/05/2013  . Atherosclerotic peripheral vascular disease with gangrene [facility]) august 2012  . Cardiomyopathy, ischemic    a. EF 35 to 40% by echo in 2013 b. EF improved to 50-55% by echo in  04/2015.  [name] Cerebral infarct ([place]) 08/17/2013  . Chronic diastolic CHF (congestive heart failure) (HCC)    a. EF 50-55% by echo in 04/2015  . COPD (chronic obstructive pulmonary disease) ([place])   . Coronary artery disease, occlusive    a. Previous PCI to the LAD, LCx, and RCA in 2010, 2011, 2013, and 2016. All performed in [place].  . Depression   . 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 Diabetic diarrhea ([place]) 10/03/2014  . DM type 2, uncontrolled, with renal complications ([place]) 0/0/1749  . Hepatic steatosis    by CT abd pelvis  . History of kidney stones    at a younger age  . Hyperlipidemia LDL goal <100 02/23/2014  . Hypertensive heart disease   . Osteoporosis, post-menopausal   . 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 Pleural effusion 10/25/2012   10/2012 CT chest >> small to moderate R lung effusion>> chylothorax, 100% lymphs 10/2013 thoracentesis> cytology negative, WBC 1471, > 90% "small lymphs" 01/2014 CT chest> near complete resolution of pleural effusion, stable lymphadenopathy 08/2014 CT chest [place] ([facility]): small right sided effusion decreased in size, stable mediastinal lymphadenopathy 1.0cm largest, 89m  . 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 Renal insufficiency   . Sarcoidosis   . Sleep apnea    Past Surgical History:  Procedure Laterality Date  . ABDOMINAL HYSTERECTOMY     at ge 39. secondary to bleeding/partial  . ANTERIOR CERVICAL DECOMP/DISCECTOMY FUSION N/A 10/10/2016   Procedure: Anterior Cervical Discectomy Fusion - Cervical four4- five - Cervical five-Cervical six - Cervical six-Cervical seven;  Surgeon: Earnie Larsson, MD;  Location: [facility];  Service: Neurosurgery;  Laterality: N/A;  . BREAST CYST ASPIRATION Right   . CARPAL TUNNEL RELEASE Bilateral   . CHOLECYSTECTOMY     in [place]   . COLONOSCOPY WITH PROPOFOL N/A 01/09/2015   Procedure: COLONOSCOPY WITH PROPOFOL;  Surgeon: Lucilla Lame, MD;  Location: [facility] ENDOSCOPY;  Service: Endoscopy;  Laterality: N/A;  . CORONARY ANGIOPLASTY WITH STENT PLACEMENT     [place]; [facility]  . EYE SURGERY     bil cataracts  . HERNIA REPAIR     umbilical/ Dr Pat Patrick  . PTCA  August 2012   Right Posterior tibial artery , [place]  . TOE AMPUTATION  Sept 2012   Right 2nd toe, [place]    Allergies  Allergen Reactions  . Cephalexin Itching  . Contrast Media [Iodinated Diagnostic Agents] Itching  . Doxycycline Itching  . Gabapentin Itching  . Sulfa Antibiotics Itching    Allergies as of 10/22/2016      Reactions   Cephalexin Itching   Contrast Media [iodinated Diagnostic Agents] Itching   Doxycycline Itching   Gabapentin Itching   Sulfa Antibiotics Itching      Medication List       Accurate as of 10/22/16 11:59 PM. Always use your most recent med list.          acetaminophen 500 MG tablet Commonly known as:  TYLENOL Take 1,000 mg by mouth every 6 (six) hours as needed for mild pain or headache.   albuterol 108 (90 Base) MCG/ACT inhaler Commonly known as:  PROAIR HFA Inhale 2 puffs into the lungs every 6 (six) hours as needed for wheezing or shortness of breath.   albuterol (2.5 MG/3ML) 0.083% nebulizer solution Commonly known as:  PROVENTIL Take 3 mLs (2.5 mg total) by nebulization every 6 (six) hours as needed for wheezing or shortness of breath.   alendronate 70 MG tablet Commonly known as:  FOSAMAX Take 1 tablet (70 mg total) by mouth once a week. Pt takes on Sunday.   Take with a full glass of water on an empty stomach.   ALPRAZolam 0.5 MG tablet Commonly known as:  XANAX Take 1 tablet (0.5 mg total) by mouth daily.   aspirin EC 81 MG  tablet Take 81 mg by mouth daily.   B-D UF III MINI PEN NEEDLES 31G X 5 MM Misc Generic drug:  Insulin Pen Needle USE THREE TIMES A DAY   BRILINTA 90 MG Tabs tablet Generic drug:  ticagrelor TAKE 1 TABLET TWICE A DAY   budesonide-formoterol 160-4.5 MCG/ACT inhaler Commonly known as:  SYMBICORT Inhale 2 puffs into the lungs 2 (two) times daily.   cyclobenzaprine 10 MG tablet Commonly known as:  FLEXERIL Take 1 tablet (10 mg total) by mouth 3 (three) times daily as needed for muscle spasms.   docusate sodium 100 MG capsule Commonly known as:  COLACE Take 100 mg by mouth 2 (two) times daily as needed for mild constipation. Reported on 11/09/2015   fluticasone 50 MCG/ACT nasal spray  Commonly known as:  FLONASE Place 2 sprays into both nostrils daily as needed for rhinitis.   furosemide 20 MG tablet Commonly known as:  LASIX Take 1 tablet (20 mg total) by mouth daily.   glucose blood test strip Commonly known as:  ACCU-CHEK AVIVA PLUS USE TO CHECK GLUCOSE  THREE TIMES DAILY   HYDROcodone-acetaminophen 5-325 MG tablet Commonly known as:  NORCO/VICODIN Take 1-2 tablets by mouth every 4 (four) hours as needed (breakthrough pain).   Insulin Lispro Prot & Lispro (75-25) 100 UNIT/ML Kwikpen Commonly known as:  HUMALOG MIX 75/25 KWIKPEN Inject 45 to 60 units twice daily before morning and evening meals   isosorbide mononitrate 30 MG 24 hr tablet Commonly known as:  IMDUR TAKE 1 TABLET DAILY   losartan 100 MG tablet Commonly known as:  COZAAR TAKE 1 TABLET DAILY   metoprolol succinate 25 MG 24 hr tablet Commonly known as:  TOPROL-XL Take 1 tablet (25 mg total) by mouth daily.   nitroGLYCERIN 0.4 MG SL tablet Commonly known as:  NITROSTAT Place 1 tablet (0.4 mg total) under the tongue every 5 (five) minutes as needed for chest pain.   ondansetron 4 MG tablet Commonly known as:  ZOFRAN Take 1 tablet (4 mg total) by mouth every 8 (eight) hours as needed for nausea or  vomiting.   PARoxetine 20 MG tablet Commonly known as:  PAXIL TAKE ONE TABLET BY MOUTH ONCE DAILY   polyethylene glycol packet Commonly known as:  MIRALAX / GLYCOLAX Take 17 g by mouth daily as needed for mild constipation. Reported on 10/25/2015   pregabalin 100 MG capsule Commonly known as:  LYRICA Take 1 capsule (100 mg total) by mouth 3 (three) times daily.   rosuvastatin 20 MG tablet Commonly known as:  CRESTOR Take 20 mg by mouth daily.       Review of Systems  Constitutional: Negative for activity change, appetite change, fatigue and unexpected weight change.  HENT: Negative for congestion and hearing loss.   Eyes: Negative.   Respiratory: Negative for cough and shortness of breath.   Cardiovascular: Negative for chest pain, palpitations and leg swelling.  Gastrointestinal: Negative for abdominal pain, constipation and diarrhea.  Genitourinary: Negative for difficulty urinating and dysuria.  Musculoskeletal: Positive for arthralgias, myalgias and neck pain.  Skin: Negative for color change and wound.  Neurological: Negative for dizziness, syncope, weakness, light-headedness and numbness.  Psychiatric/Behavioral: Negative for agitation, behavioral problems and confusion.    Immunization History  Administered Date(s) Administered  . Influenza Split 03/23/2012  . Influenza, High Dose Seasonal PF 01/15/2016  . Influenza,inj,Quad PF,36+ Mos 02/03/2013, 02/20/2014, 02/21/2015  . Influenza-Unspecified 04/22/2015  . PPD Test 04/26/2015, 05/07/2015  . Pneumococcal Conjugate-13 07/01/2013  . Pneumococcal Polysaccharide-23 02/26/2012  . Pneumococcal-Unspecified 04/22/2015   Pertinent  Health Maintenance Due  Topic Date Due  . OPHTHALMOLOGY EXAM  08/01/2016  . FOOT EXAM  09/25/2016  . INFLUENZA VACCINE  12/10/2016  . HEMOGLOBIN A1C  04/10/2017  . DEXA SCAN  Completed  . PNA vac Low Risk Adult  Completed   Fall Risk  07/01/2016 02/12/2016 12/03/2015 10/10/2015 07/16/2015    Falls in the past year? Yes No Yes Yes -  Number falls in past yr: 1 - 2 or more 1 -  Injury with Fall? No - No Yes -  Risk Factor Category  - - High Fall Risk - -  Risk for fall due to : Impaired balance/gait - History of fall(s);Other (Comment) Impaired mobility Impaired balance/gait  Risk for fall due to (comments): - - occasional episodes of dizziness, lightheaded feeling use cane  -  Follow up Falls prevention discussed;Education provided - Education provided Falls prevention discussed -   Functional Status Survey:    Vitals:   10/22/16 0610  BP: 136/67  Pulse: 79  Resp: 16  Temp: 97.6 F (36.4 C)  SpO2: 98%  Weight: 184 lb 3.2 oz (83.6 kg)   Body mass index is 28.85 kg/m. Physical Exam  Constitutional: She is oriented to person, place, and time. Vital signs are normal. She appears well-developed and well-nourished. She is active and cooperative. She does not appear ill. No distress.  HENT:  Head: Normocephalic and atraumatic.  Mouth/Throat: Uvula is midline, oropharynx is clear and moist and mucous membranes are normal. Mucous membranes are not pale, not dry and not cyanotic.  Eyes: Conjunctivae, EOM and lids are normal. Pupils are equal, round, and reactive to light.  Neck: Trachea normal, normal range of motion and full passive range of motion without pain. Neck supple. No JVD present. No tracheal deviation, no edema and no erythema present. No thyromegaly present.  Cardiovascular: Normal rate, regular rhythm, normal heart sounds, intact distal pulses and normal pulses.  Exam reveals no gallop, no distant heart sounds and no friction rub.   No murmur heard. Pulses:      Radial pulses are 2+ on the right side, and 2+ on the left side.  Pulmonary/Chest: Effort normal and breath sounds normal. No accessory muscle usage. No respiratory distress. She has no wheezes. She has no rales. She exhibits no tenderness.  Abdominal: Normal appearance and bowel sounds are normal. She  exhibits no distension and no ascites. There is no tenderness.  Musculoskeletal: Normal range of motion. She exhibits no edema or tenderness.       Left shoulder: She exhibits pain. She exhibits no swelling, no effusion, no crepitus, no deformity and normal strength.  Expected osteoarthritis, stiffness; Calves soft, supple. Negative Homan's sign; BUE equal grips, strength. Full ROM  Neurological: She is alert and oriented to person, place, and time. She has normal strength.  Skin: Skin is warm, dry and intact. No rash noted. She is not diaphoretic. No cyanosis or erythema. No pallor. Nails show no clubbing.  Psychiatric: She has a normal mood and affect. Her speech is normal and behavior is normal. Judgment and thought content normal. Cognition and memory are normal.  Nursing note and vitals reviewed.   Labs reviewed:  Recent Labs  06/11/16 1236 06/29/16 1038 10/08/16 1546  NA 139 139 136  K 3.7 4.2 4.5  CL 107 108 103  CO2 27 25 27   GLUCOSE 107* 155* 132*  BUN 12 14 25*  CREATININE 1.11* 1.05* 1.25*  CALCIUM 8.6* 8.8* 9.9    Recent Labs  03/14/16 1452 06/29/16 1038  AST 16 18  ALT 12 14  ALKPHOS 52 45  BILITOT 0.8 0.9  PROT 7.4 7.0  ALBUMIN 3.8 3.3*    Recent Labs  05/16/16 1336 05/17/16 2316 06/11/16 1236 06/29/16 1038 10/08/16 1546  WBC 6.7 6.3 6.0 7.1 7.0  NEUTROABS 4.9 5.4  --   --  4.5  HGB 11.6* 12.1 10.9* 11.1* 11.7*  HCT 34.7* 35.0 32.4* 33.1* 36.8  MCV 86.3 85.7 85.7 86.2 90.2  PLT 403.0* 373 335 297 286   Lab Results  Component Value Date   TSH 1.16 07/01/2013   Lab Results  Component Value Date   HGBA1C 7.5 (H) 10/08/2016   Lab  Results  Component Value Date   CHOL 145 06/18/2016   HDL 47.00 06/18/2016   LDLCALC 66 06/18/2016   LDLDIRECT 102.0 03/14/2016   TRIG 164.0 (H) 06/18/2016   CHOLHDL 3 06/18/2016    Significant Diagnostic Results in last 30 days:  No results found.  Assessment/Plan 1. Osteoarthritis of both shoulders,  unspecified osteoarthritis type  Voltaren gel 1% 2 grams QID to Both shoulders   Continue Norco 5/325 mg 1-2 tablet po Q 4 hours prn pain  Ice prn  Family/ staff Communication:   Total Time:  Documentation:  Face to Face:  Family/Phone:   Labs/tests ordered:  Left shoulder complete view xrays  Medication list reviewed and assessed for continued appropriateness.  Vikki Ports, NP-C Geriatrics [facility] [phone], [place] [address] Cell Phone (Mon-Fri 8am-5pm):  [phone] On Call:  [phone] & follow prompts after 5pm & weekends Office Phone:  [phone] Office Fax:  [phone]

## 2016-11-11 ENCOUNTER — Telehealth: Payer: Self-pay | Admitting: Internal Medicine

## 2016-11-11 DIAGNOSIS — E1140 Type 2 diabetes mellitus with diabetic neuropathy, unspecified: Secondary | ICD-10-CM | POA: Diagnosis not present

## 2016-11-11 DIAGNOSIS — E1151 Type 2 diabetes mellitus with diabetic peripheral angiopathy without gangrene: Secondary | ICD-10-CM | POA: Diagnosis not present

## 2016-11-11 DIAGNOSIS — Z4789 Encounter for other orthopedic aftercare: Secondary | ICD-10-CM | POA: Diagnosis not present

## 2016-11-11 DIAGNOSIS — M4802 Spinal stenosis, cervical region: Secondary | ICD-10-CM | POA: Diagnosis not present

## 2016-11-11 DIAGNOSIS — I130 Hypertensive heart and chronic kidney disease with heart failure and stage 1 through stage 4 chronic kidney disease, or unspecified chronic kidney disease: Secondary | ICD-10-CM | POA: Diagnosis not present

## 2016-11-11 DIAGNOSIS — I5032 Chronic diastolic (congestive) heart failure: Secondary | ICD-10-CM | POA: Diagnosis not present

## 2016-11-11 NOTE — Telephone Encounter (Signed)
Patient in home nursing care is [facility], Dme order for shower chair faxed to [facility] today. Fax [phone]

## 2016-11-12 DIAGNOSIS — Z9181 History of falling: Secondary | ICD-10-CM | POA: Insufficient documentation

## 2016-11-12 NOTE — Assessment & Plan Note (Signed)
Renal function is back to baseline  Lab Results  Component Value Date   CREATININE 1.25 (H) 10/08/2016   Lab Results  Component Value Date   NA 136 10/08/2016   K 4.5 10/08/2016   CL 103 10/08/2016   CO2 27 10/08/2016

## 2016-11-12 NOTE — Assessment & Plan Note (Signed)
She has impaired balance and needs a bench to straddle her tub side to prevent falls

## 2016-11-12 NOTE — Assessment & Plan Note (Signed)
Dose of alprazolam reduced due to observations of AMS by husband.

## 2016-11-12 NOTE — Assessment & Plan Note (Signed)
Her current chest pain complaints are non cardiac and related to her neck surgery and shoulder pain

## 2016-11-12 NOTE — Assessment & Plan Note (Signed)
S.p anterior decompression /diskectomy,  3 level on June 1 by Deri Fuelling.

## 2016-11-12 NOTE — Assessment & Plan Note (Addendum)
Previously  well-controlled on current medications.  Patient has increased her dose of 70/30 insulin to 40 units in the morning and 60 units before dinner.  She   is up-to-date on eye exams and foot exam is normal today. Patient is due for urine microalbumin to creatinine ratio at next visit. Patient is tolerating statin therapy for CAD risk reduction and on ACE/ARB for reduction in proteinuria.  Lab Results  Component Value Date   HGBA1C 7.5 (H) 10/08/2016   Lab Results  Component Value Date   MICROALBUR 28.9 (H) 09/26/2015

## 2016-11-13 ENCOUNTER — Telehealth: Payer: Self-pay | Admitting: *Deleted

## 2016-11-13 DIAGNOSIS — Z4789 Encounter for other orthopedic aftercare: Secondary | ICD-10-CM | POA: Diagnosis not present

## 2016-11-13 DIAGNOSIS — E1151 Type 2 diabetes mellitus with diabetic peripheral angiopathy without gangrene: Secondary | ICD-10-CM | POA: Diagnosis not present

## 2016-11-13 DIAGNOSIS — E1140 Type 2 diabetes mellitus with diabetic neuropathy, unspecified: Secondary | ICD-10-CM | POA: Diagnosis not present

## 2016-11-13 DIAGNOSIS — I130 Hypertensive heart and chronic kidney disease with heart failure and stage 1 through stage 4 chronic kidney disease, or unspecified chronic kidney disease: Secondary | ICD-10-CM | POA: Diagnosis not present

## 2016-11-13 DIAGNOSIS — M4802 Spinal stenosis, cervical region: Secondary | ICD-10-CM | POA: Diagnosis not present

## 2016-11-13 DIAGNOSIS — I5032 Chronic diastolic (congestive) heart failure: Secondary | ICD-10-CM | POA: Diagnosis not present

## 2016-11-13 NOTE — Telephone Encounter (Signed)
Merry Proud from [facility] requested verbal orders for a speech therapist  Contact Merry Proud [phone]

## 2016-11-13 NOTE — Telephone Encounter (Signed)
Verbal okay was given to [place] at [place].

## 2016-11-14 ENCOUNTER — Encounter: Payer: Self-pay | Admitting: Emergency Medicine

## 2016-11-14 ENCOUNTER — Emergency Department: Payer: Medicare Other

## 2016-11-14 ENCOUNTER — Observation Stay
Admission: EM | Admit: 2016-11-14 | Discharge: 2016-11-15 | Disposition: A | Discharge status: Home / Self Care | Payer: Medicare Other | Attending: Internal Medicine | Admitting: Internal Medicine

## 2016-11-14 DIAGNOSIS — N183 Chronic kidney disease, stage 3 unspecified: Secondary | ICD-10-CM

## 2016-11-14 DIAGNOSIS — I25118 Atherosclerotic heart disease of native coronary artery with other forms of angina pectoris: Secondary | ICD-10-CM | POA: Diagnosis not present

## 2016-11-14 DIAGNOSIS — E1121 Type 2 diabetes mellitus with diabetic nephropathy: Secondary | ICD-10-CM | POA: Insufficient documentation

## 2016-11-14 DIAGNOSIS — I700 Atherosclerosis of aorta: Secondary | ICD-10-CM | POA: Diagnosis not present

## 2016-11-14 DIAGNOSIS — M47814 Spondylosis without myelopathy or radiculopathy, thoracic region: Secondary | ICD-10-CM | POA: Diagnosis not present

## 2016-11-14 DIAGNOSIS — Z87442 Personal history of urinary calculi: Secondary | ICD-10-CM | POA: Insufficient documentation

## 2016-11-14 DIAGNOSIS — Z91041 Radiographic dye allergy status: Secondary | ICD-10-CM | POA: Insufficient documentation

## 2016-11-14 DIAGNOSIS — M1990 Unspecified osteoarthritis, unspecified site: Secondary | ICD-10-CM | POA: Insufficient documentation

## 2016-11-14 DIAGNOSIS — I130 Hypertensive heart and chronic kidney disease with heart failure and stage 1 through stage 4 chronic kidney disease, or unspecified chronic kidney disease: Secondary | ICD-10-CM | POA: Diagnosis not present

## 2016-11-14 DIAGNOSIS — Z955 Presence of coronary angioplasty implant and graft: Secondary | ICD-10-CM | POA: Insufficient documentation

## 2016-11-14 DIAGNOSIS — E785 Hyperlipidemia, unspecified: Secondary | ICD-10-CM | POA: Insufficient documentation

## 2016-11-14 DIAGNOSIS — Z86010 Personal history of colonic polyps: Secondary | ICD-10-CM | POA: Insufficient documentation

## 2016-11-14 DIAGNOSIS — I255 Ischemic cardiomyopathy: Secondary | ICD-10-CM | POA: Diagnosis not present

## 2016-11-14 DIAGNOSIS — I2510 Atherosclerotic heart disease of native coronary artery without angina pectoris: Secondary | ICD-10-CM | POA: Diagnosis present

## 2016-11-14 DIAGNOSIS — Z4789 Encounter for other orthopedic aftercare: Secondary | ICD-10-CM | POA: Diagnosis not present

## 2016-11-14 DIAGNOSIS — I452 Bifascicular block: Secondary | ICD-10-CM | POA: Diagnosis not present

## 2016-11-14 DIAGNOSIS — D860 Sarcoidosis of lung: Secondary | ICD-10-CM | POA: Diagnosis not present

## 2016-11-14 DIAGNOSIS — G4733 Obstructive sleep apnea (adult) (pediatric): Secondary | ICD-10-CM | POA: Diagnosis not present

## 2016-11-14 DIAGNOSIS — R0789 Other chest pain: Secondary | ICD-10-CM | POA: Diagnosis not present

## 2016-11-14 DIAGNOSIS — F329 Major depressive disorder, single episode, unspecified: Secondary | ICD-10-CM | POA: Diagnosis not present

## 2016-11-14 DIAGNOSIS — Z881 Allergy status to other antibiotic agents status: Secondary | ICD-10-CM | POA: Insufficient documentation

## 2016-11-14 DIAGNOSIS — I5032 Chronic diastolic (congestive) heart failure: Secondary | ICD-10-CM | POA: Diagnosis not present

## 2016-11-14 DIAGNOSIS — M25512 Pain in left shoulder: Secondary | ICD-10-CM | POA: Diagnosis not present

## 2016-11-14 DIAGNOSIS — M810 Age-related osteoporosis without current pathological fracture: Secondary | ICD-10-CM | POA: Diagnosis not present

## 2016-11-14 DIAGNOSIS — J449 Chronic obstructive pulmonary disease, unspecified: Secondary | ICD-10-CM | POA: Diagnosis not present

## 2016-11-14 DIAGNOSIS — Z794 Long term (current) use of insulin: Secondary | ICD-10-CM | POA: Insufficient documentation

## 2016-11-14 DIAGNOSIS — R079 Chest pain, unspecified: Secondary | ICD-10-CM

## 2016-11-14 DIAGNOSIS — K760 Fatty (change of) liver, not elsewhere classified: Secondary | ICD-10-CM | POA: Diagnosis not present

## 2016-11-14 DIAGNOSIS — F418 Other specified anxiety disorders: Secondary | ICD-10-CM | POA: Diagnosis not present

## 2016-11-14 DIAGNOSIS — E1140 Type 2 diabetes mellitus with diabetic neuropathy, unspecified: Secondary | ICD-10-CM | POA: Insufficient documentation

## 2016-11-14 DIAGNOSIS — G8929 Other chronic pain: Secondary | ICD-10-CM | POA: Diagnosis not present

## 2016-11-14 DIAGNOSIS — I10 Essential (primary) hypertension: Secondary | ICD-10-CM | POA: Diagnosis present

## 2016-11-14 DIAGNOSIS — R7989 Other specified abnormal findings of blood chemistry: Secondary | ICD-10-CM | POA: Diagnosis not present

## 2016-11-14 DIAGNOSIS — Z79899 Other long term (current) drug therapy: Secondary | ICD-10-CM | POA: Insufficient documentation

## 2016-11-14 DIAGNOSIS — Z888 Allergy status to other drugs, medicaments and biological substances status: Secondary | ICD-10-CM | POA: Insufficient documentation

## 2016-11-14 DIAGNOSIS — E1151 Type 2 diabetes mellitus with diabetic peripheral angiopathy without gangrene: Secondary | ICD-10-CM | POA: Diagnosis not present

## 2016-11-14 DIAGNOSIS — Z8249 Family history of ischemic heart disease and other diseases of the circulatory system: Secondary | ICD-10-CM | POA: Insufficient documentation

## 2016-11-14 DIAGNOSIS — Z8673 Personal history of transient ischemic attack (TIA), and cerebral infarction without residual deficits: Secondary | ICD-10-CM | POA: Insufficient documentation

## 2016-11-14 DIAGNOSIS — I208 Other forms of angina pectoris: Secondary | ICD-10-CM | POA: Diagnosis present

## 2016-11-14 DIAGNOSIS — M4802 Spinal stenosis, cervical region: Secondary | ICD-10-CM | POA: Diagnosis not present

## 2016-11-14 DIAGNOSIS — Z86711 Personal history of pulmonary embolism: Secondary | ICD-10-CM | POA: Insufficient documentation

## 2016-11-14 DIAGNOSIS — R778 Other specified abnormalities of plasma proteins: Secondary | ICD-10-CM

## 2016-11-14 DIAGNOSIS — D869 Sarcoidosis, unspecified: Secondary | ICD-10-CM | POA: Diagnosis present

## 2016-11-14 DIAGNOSIS — Z882 Allergy status to sulfonamides status: Secondary | ICD-10-CM | POA: Insufficient documentation

## 2016-11-14 DIAGNOSIS — I2089 Other forms of angina pectoris: Secondary | ICD-10-CM | POA: Diagnosis present

## 2016-11-14 DIAGNOSIS — E782 Mixed hyperlipidemia: Secondary | ICD-10-CM | POA: Diagnosis present

## 2016-11-14 LAB — CBC
HEMATOCRIT: 32.8 % — AB (ref 35.0–47.0)
Hemoglobin: 11 g/dL — ABNORMAL LOW (ref 12.0–16.0)
MCH: 29.2 pg (ref 26.0–34.0)
MCHC: 33.6 g/dL (ref 32.0–36.0)
MCV: 86.8 fL (ref 80.0–100.0)
Platelets: 320 10*3/uL (ref 150–440)
RBC: 3.78 MIL/uL — AB (ref 3.80–5.20)
RDW: 15.8 % — ABNORMAL HIGH (ref 11.5–14.5)
WBC: 7 10*3/uL (ref 3.6–11.0)

## 2016-11-14 LAB — BASIC METABOLIC PANEL
ANION GAP: 7 (ref 5–15)
BUN: 27 mg/dL — ABNORMAL HIGH (ref 6–20)
CALCIUM: 9.6 mg/dL (ref 8.9–10.3)
CO2: 27 mmol/L (ref 22–32)
Chloride: 106 mmol/L (ref 101–111)
Creatinine, Ser: 1.39 mg/dL — ABNORMAL HIGH (ref 0.44–1.00)
GFR, EST AFRICAN AMERICAN: 42 mL/min — AB (ref >60)
GFR, EST NON AFRICAN AMERICAN: 36 mL/min — AB (ref >60)
Glucose, Bld: 147 mg/dL — ABNORMAL HIGH (ref 65–99)
POTASSIUM: 4.1 mmol/L (ref 3.5–5.1)
Sodium: 140 mmol/L (ref 135–145)

## 2016-11-14 LAB — GLUCOSE, CAPILLARY
GLUCOSE-CAPILLARY: 102 mg/dL — AB (ref 65–99)
Glucose-Capillary: 167 mg/dL — ABNORMAL HIGH (ref 65–99)

## 2016-11-14 LAB — TROPONIN I
TROPONIN I: 0.03 ng/mL — AB (ref <0.03)
TROPONIN I: 0.03 ng/mL — AB (ref <0.03)
Troponin I: 0.03 ng/mL (ref <0.03)

## 2016-11-14 MED ORDER — INSULIN ASPART PROT & ASPART (70-30 MIX) 100 UNIT/ML ~~LOC~~ SUSP: 44.0000 [IU] | Freq: Every day | SUBCUTANEOUS | Status: DC

## 2016-11-14 MED ORDER — FUROSEMIDE 40 MG PO TABS
20.0000 mg | ORAL_TABLET | ORAL | Status: DC
  Administered 2016-11-15: 20 mg via ORAL
  Filled 2016-11-14: qty 1

## 2016-11-14 MED ORDER — SODIUM CHLORIDE 0.9 % IV SOLN
INTRAVENOUS | Status: DC
  Administered 2016-11-14: 23:00:00 via INTRAVENOUS

## 2016-11-14 MED ORDER — MOMETASONE FURO-FORMOTEROL FUM 200-5 MCG/ACT IN AERO
2.0000 | INHALATION_SPRAY | Freq: Two times a day (BID) | RESPIRATORY_TRACT | Status: DC
  Administered 2016-11-14: 2 via RESPIRATORY_TRACT
  Filled 2016-11-14: qty 8.8

## 2016-11-14 MED ORDER — POLYETHYLENE GLYCOL 3350 17 G PO PACK: 17.0000 g | PACK | Freq: Every day | ORAL | Status: DC | PRN

## 2016-11-14 MED ORDER — MORPHINE SULFATE (PF) 2 MG/ML IV SOLN: 2.0000 mg | INTRAVENOUS | Status: DC | PRN

## 2016-11-14 MED ORDER — PREGABALIN 50 MG PO CAPS
100.0000 mg | ORAL_CAPSULE | Freq: Two times a day (BID) | ORAL | Status: DC
  Administered 2016-11-14 – 2016-11-15 (×2): 100 mg via ORAL
  Filled 2016-11-14 (×2): qty 2

## 2016-11-14 MED ORDER — ISOSORBIDE MONONITRATE ER 60 MG PO TB24: 30.0000 mg | ORAL_TABLET | Freq: Every day | ORAL | Status: DC

## 2016-11-14 MED ORDER — ACETAMINOPHEN 500 MG PO TABS
1000.0000 mg | ORAL_TABLET | Freq: Once | ORAL | Status: AC
  Administered 2016-11-14: 1000 mg via ORAL
  Filled 2016-11-14: qty 2

## 2016-11-14 MED ORDER — ASPIRIN EC 81 MG PO TBEC
81.0000 mg | DELAYED_RELEASE_TABLET | Freq: Every day | ORAL | Status: DC
  Administered 2016-11-15: 81 mg via ORAL
  Filled 2016-11-14: qty 1

## 2016-11-14 MED ORDER — DOCUSATE SODIUM 100 MG PO CAPS: 100.0000 mg | ORAL_CAPSULE | Freq: Two times a day (BID) | ORAL | Status: DC | PRN

## 2016-11-14 MED ORDER — INSULIN LISPRO PROT & LISPRO (75-25 MIX) 100 UNIT/ML KWIKPEN: 44.0000 [IU] | PEN_INJECTOR | Freq: Every day | SUBCUTANEOUS | Status: DC

## 2016-11-14 MED ORDER — NITROGLYCERIN 0.4 MG SL SUBL: 0.4000 mg | SUBLINGUAL_TABLET | SUBLINGUAL | Status: DC | PRN

## 2016-11-14 MED ORDER — ONDANSETRON HCL 4 MG/2ML IJ SOLN: 4.0000 mg | Freq: Four times a day (QID) | INTRAMUSCULAR | Status: DC | PRN

## 2016-11-14 MED ORDER — HEPARIN SODIUM (PORCINE) 5000 UNIT/ML IJ SOLN
5000.0000 [IU] | Freq: Three times a day (TID) | INTRAMUSCULAR | Status: DC
  Administered 2016-11-15 (×2): 5000 [IU] via SUBCUTANEOUS
  Filled 2016-11-14 (×2): qty 1

## 2016-11-14 MED ORDER — LOSARTAN POTASSIUM 50 MG PO TABS
100.0000 mg | ORAL_TABLET | Freq: Every day | ORAL | Status: DC
  Administered 2016-11-15: 100 mg via ORAL
  Filled 2016-11-14: qty 2

## 2016-11-14 MED ORDER — HYDROCODONE-ACETAMINOPHEN 5-325 MG PO TABS: 1.0000 | ORAL_TABLET | ORAL | Status: DC | PRN

## 2016-11-14 MED ORDER — INSULIN ASPART 100 UNIT/ML ~~LOC~~ SOLN
0.0000 [IU] | Freq: Three times a day (TID) | SUBCUTANEOUS | Status: DC
  Administered 2016-11-15: 3 [IU] via SUBCUTANEOUS
  Filled 2016-11-14: qty 1

## 2016-11-14 MED ORDER — ACETAMINOPHEN 325 MG PO TABS
650.0000 mg | ORAL_TABLET | ORAL | Status: DC | PRN
  Administered 2016-11-14 – 2016-11-15 (×3): 650 mg via ORAL
  Filled 2016-11-14 (×3): qty 2

## 2016-11-14 MED ORDER — ROSUVASTATIN CALCIUM 20 MG PO TABS: 20.0000 mg | ORAL_TABLET | Freq: Every day | ORAL | Status: DC

## 2016-11-14 MED ORDER — TICAGRELOR 90 MG PO TABS
90.0000 mg | ORAL_TABLET | Freq: Two times a day (BID) | ORAL | Status: DC
  Administered 2016-11-14 – 2016-11-15 (×2): 90 mg via ORAL
  Filled 2016-11-14 (×3): qty 1

## 2016-11-14 MED ORDER — ROSUVASTATIN CALCIUM 10 MG PO TABS
20.0000 mg | ORAL_TABLET | Freq: Every day | ORAL | Status: DC
  Administered 2016-11-14: 20 mg via ORAL
  Filled 2016-11-14: qty 2

## 2016-11-14 MED ORDER — GI COCKTAIL ~~LOC~~
30.0000 mL | Freq: Four times a day (QID) | ORAL | Status: DC | PRN
  Filled 2016-11-14: qty 30

## 2016-11-14 MED ORDER — INSULIN ASPART PROT & ASPART (70-30 MIX) 100 UNIT/ML ~~LOC~~ SUSP: 60.0000 [IU] | Freq: Every evening | SUBCUTANEOUS | Status: DC

## 2016-11-14 MED ORDER — ONDANSETRON HCL 4 MG PO TABS: 4.0000 mg | ORAL_TABLET | Freq: Three times a day (TID) | ORAL | Status: DC | PRN

## 2016-11-14 MED ORDER — METOPROLOL SUCCINATE ER 25 MG PO TB24
25.0000 mg | ORAL_TABLET | Freq: Every day | ORAL | Status: DC
  Administered 2016-11-15: 25 mg via ORAL
  Filled 2016-11-14: qty 1

## 2016-11-14 MED ORDER — ALPRAZOLAM 0.5 MG PO TABS
0.2500 mg | ORAL_TABLET | Freq: Every day | ORAL | Status: DC
  Administered 2016-11-15: 0.25 mg via ORAL
  Filled 2016-11-14: qty 1

## 2016-11-14 MED ORDER — FLUTICASONE PROPIONATE 50 MCG/ACT NA SUSP
2.0000 | Freq: Every day | NASAL | Status: DC | PRN
  Filled 2016-11-14: qty 16

## 2016-11-14 MED ORDER — INSULIN ASPART PROT & ASPART (70-30 MIX) 100 UNIT/ML ~~LOC~~ SUSP
44.0000 [IU] | Freq: Every day | SUBCUTANEOUS | Status: DC
  Filled 2016-11-14: qty 10

## 2016-11-14 MED ORDER — INSULIN ASPART 100 UNIT/ML ~~LOC~~ SOLN: 0.0000 [IU] | Freq: Every day | SUBCUTANEOUS | Status: DC

## 2016-11-14 MED ORDER — PAROXETINE HCL 20 MG PO TABS
20.0000 mg | ORAL_TABLET | Freq: Every day | ORAL | Status: DC
  Administered 2016-11-15: 20 mg via ORAL
  Filled 2016-11-14: qty 1

## 2016-11-14 NOTE — H&P (Signed)
[place] @ [facility] Admission History and Physical Harvie Bridge, D.O.  ---------------------------------------------------------------------------------------------------------------------   PATIENT NAME: Harue Pribble MR#: [id number] DATE OF BIRTH: June 01, 1940 DATE OF ADMISSION: 11/14/2016 PRIMARY CARE PHYSICIAN: [name], MD  REQUESTING/REFERRING PHYSICIAN: ED Dr. Archie Balboa  CHIEF COMPLAINT: Chief Complaint  Patient presents with  . Chest Pain    HISTORY OF PRESENT ILLNESS: Kinlee Garrison is a 76 y.o. female with a known history of CAD, ischemic cardiomyopathy, chronic diastolic CHF, COPD, DM, depression, HTN, HLD, sarcoidosis, CKD, OSA presents to the emergency department for evaluation of Chest pain.  Patient was in a usual state of health until Today while she was at physical therapy she developed central chest pressure which radiated to the left upper chest and shoulder. She states that her left shoulder pain is chronic following a cervical spine surgery. The pain she experienced today different quality and location from her usual left-sided shoulder pain  Ot she denied any associated nausea, vomiting, shortness of breath, diaphoresis, palpitations. Prior to arrival in the emergency department she took nitroglycerin and aspirin and symptoms are essentially resolved at present.  Oherwise there has been no change in status. Patient has been taking medication as prescribed and there has been no recent change in medication or diet.  There has been no recent illness, travel or sick contacts.    Patient denies fevers/chills, weakness, dizziness, shortness of breath, N/V/C/D, abdominal pain, dysuria/frequency, changes in mental status.   EMS/ED COURSE:   Patient received Tylenol. Medical admission was requested for observation and to further evaluate chest pain rule out ACS  PAST MEDICAL HISTORY: Past Medical History:  Diagnosis Date  . Acute respiratory  failure ([place]) 11/21/2014  . Anxiety   . Arthritis 08/05/2013  . Atherosclerotic peripheral vascular disease with gangrene [facility]) august 2012  . Cardiomyopathy, ischemic    a. EF 35 to 40% by echo in 2013 b. EF improved to 50-55% by echo in 04/2015.  [name] Cerebral infarct ([place]) 08/17/2013  . Chronic diastolic CHF (congestive heart failure) (HCC)    a. EF 50-55% by echo in 04/2015  . COPD (chronic obstructive pulmonary disease) ([place])   . Coronary artery disease, occlusive    a. Previous PCI to the LAD, LCx, and RCA in 2010, 2011, 2013, and 2016. All performed in [place].  . Depression   . 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 Diabetic diarrhea ([place]) 10/03/2014  . DM type 2, uncontrolled, with renal complications ([place]) 07/16/6438  . Hepatic steatosis    by CT abd pelvis  . History of kidney stones    at a younger age  . Hyperlipidemia LDL goal <100 02/23/2014  . Hypertensive heart disease   . Osteoporosis, post-menopausal   . 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 Pleural effusion 10/25/2012   10/2012 CT chest >> small to moderate R lung effusion>> chylothorax, 100% lymphs 10/2013 thoracentesis> cytology negative, WBC 1471, > 90% "small lymphs" 01/2014 CT chest> near complete resolution of pleural effusion, stable lymphadenopathy 08/2014 CT chest [place] ([facility]): small right sided effusion decreased in size, stable mediastinal lymphadenopathy 1.0cm largest, 65m  . 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 Renal insufficiency   . Sarcoidosis   . Sleep apnea       PAST SURGICAL HISTORY: Past Surgical History:  Procedure Laterality Date  . ABDOMINAL HYSTERECTOMY     at  ge 39. secondary to bleeding/partial  . ANTERIOR CERVICAL DECOMP/DISCECTOMY FUSION N/A 10/10/2016   Procedure: Anterior Cervical Discectomy Fusion - Cervical four4- five - Cervical five-Cervical six - Cervical six-Cervical seven;  Surgeon: Earnie Larsson, MD;  Location: [place];  Service: Neurosurgery;  Laterality: N/A;  . BREAST CYST ASPIRATION Right   . CARPAL TUNNEL RELEASE Bilateral   . CHOLECYSTECTOMY     in [place]   . COLONOSCOPY WITH PROPOFOL N/A 01/09/2015   Procedure: COLONOSCOPY WITH PROPOFOL;  Surgeon: Lucilla Lame, MD;  Location: [facility] ENDOSCOPY;  Service: Endoscopy;  Laterality: N/A;  . CORONARY ANGIOPLASTY WITH STENT PLACEMENT     [place]; [facility]  . EYE SURGERY     bil cataracts  . HERNIA REPAIR     umbilical/ Dr Pat Patrick  . PTCA  August 2012   Right Posterior tibial artery , [place]  . TOE AMPUTATION  Sept 2012   Right 2nd toe, [place]      SOCIAL HISTORY: Social History  Substance Use Topics  . Smoking status: Never Smoker  . Smokeless tobacco: Never Used  . Alcohol use No      FAMILY HISTORY: Family History  Problem Relation Age of Onset  . Heart disease Father   . Heart attack Father   . Heart disease Sister   . Heart attack Sister   . Heart disease Brother   . Heart attack Brother   . Asthma Grandchild   . Breast cancer Maternal Aunt      MEDICATIONS AT HOME: Prior to Admission medications   Medication Sig Start Date End Date Taking? Authorizing Provider  acetaminophen (TYLENOL) 500 MG tablet Take 1,000 mg by mouth every 6 (six) hours as needed for mild pain or headache.    Yes [provider]  alendronate (FOSAMAX) 70 MG tablet Take 1 tablet (70 mg total) by mouth once a week. Pt takes on Sunday.   Take with a full glass of water on an empty stomach. 07/01/16  Yes [name], MD  ALPRAZolam [name]) 0.25 MG tablet Take 1 tablet (0.25 mg total) by mouth daily. 11/10/16  Yes [name], MD  aspirin EC 81 MG tablet Take  81 mg by mouth daily.   Yes [provider]  BRILINTA 90 MG TABS tablet TAKE 1 TABLET TWICE A DAY 06/30/16  Yes [name], MD  budesonide-formoterol [facility]) 160-4.5 MCG/ACT inhaler Inhale 2 puffs into the lungs 2 (two) times daily. 02/23/15  Yes Juanito Doom, MD  diclofenac sodium (VOLTAREN) 1 % GEL Apply 2 g topically 4 (four) times daily.   Yes [provider]  docusate sodium (COLACE) 100 MG capsule Take 100 mg by mouth 2 (two) times daily as needed for mild constipation. Reported on 11/09/2015   Yes [provider]  fluticasone (FLONASE) 50 MCG/ACT nasal spray Place 2 sprays into both nostrils daily as needed for rhinitis.   Yes [provider]  furosemide (LASIX) 20 MG tablet Take 1 tablet (20 mg total) by mouth daily. Patient taking differently: Take 20 mg by mouth every other day.  06/11/16  Yes [name], MD  glucose blood (ACCU-CHEK AVIVA PLUS) test strip USE TO CHECK GLUCOSE  THREE  TIMES DAILY 04/29/16  Yes [name], MD  HYDROcodone-acetaminophen (NORCO/VICODIN) 5-325 MG tablet Take 1-2 tablets by mouth every 4 (four) hours as needed (breakthrough pain). 10/13/16  Yes Pool, Mallie Mussel, MD  Insulin Lispro Prot & Lispro (HUMALOG MIX 75/25 KWIKPEN) (75-25) 100 UNIT/ML Kwikpen Inject 45 to 60 units twice daily before morning and evening meals Patient taking differently: Inject 44 units in the morning and 60 units in the evening 11/27/15  Yes [name], MD  isosorbide mononitrate (IMDUR) 30 MG 24 hr tablet TAKE 1 TABLET DAILY 07/16/16  Yes [name], MD  losartan (COZAAR) 100 MG tablet TAKE 1 TABLET DAILY 09/11/16  Yes [name], MD  metoprolol succinate (TOPROL-XL) 25 MG 24 hr tablet Take 1 tablet (25 mg total) by mouth daily. 06/18/16  Yes [name], MD  nitroGLYCERIN (NITROSTAT) 0.4 MG SL tablet Place 1 tablet (0.4 mg total) under the tongue every 5 (five) minutes as needed for chest pain. 04/18/15  Yes Gollan, Kathlene November,  MD  ondansetron (ZOFRAN) 4 MG tablet Take 1 tablet (4 mg total) by mouth every 8 (eight) hours as needed for nausea or vomiting. 05/18/16  Yes Alfred Levins, [place], MD  PARoxetine (PAXIL) 20 MG tablet TAKE ONE TABLET BY MOUTH ONCE DAILY 06/25/16  Yes [name], MD  polyethylene glycol (MIRALAX / GLYCOLAX) packet Take 17 g by mouth daily as needed for mild constipation. Reported on 10/25/2015   Yes [provider]  pregabalin (LYRICA) 100 MG capsule Take 1 capsule (100 mg total) by mouth 3 (three) times daily. Patient taking differently: Take 100 mg by mouth 2 (two) times daily.  04/29/16  Yes [name], MD  rosuvastatin (CRESTOR) 20 MG tablet Take 20 mg by mouth daily.    Yes [provider]      DRUG ALLERGIES: Allergies  Allergen Reactions  . Cephalexin Itching  . Contrast Media [Iodinated Diagnostic Agents] Itching  . Doxycycline Itching  . Gabapentin Itching  . Sulfa Antibiotics Itching     REVIEW OF SYSTEMS: CONSTITUTIONAL: No fatigue, weakness, fever, chills, weight gain/loss, headache EYES: No blurry or double vision. ENT: No tinnitus, postnasal drip, redness or soreness of the oropharynx. RESPIRATORY: No dyspnea, cough, wheeze, hemoptysis. CARDIOVASCULAR: Positive chest pain, negative orthopnea, palpitations, syncope. GASTROINTESTINAL: No nausea, vomiting, constipation, diarrhea, abdominal pain. No hematemesis, melena or hematochezia. GENITOURINARY: No dysuria, frequency, hematuria. ENDOCRINE: No polyuria or nocturia. No heat or cold intolerance. HEMATOLOGY: No anemia, bruising, bleeding. INTEGUMENTARY: No rashes, ulcers, lesions. MUSCULOSKELETAL: No pain, arthritis, swelling, gout. NEUROLOGIC: No numbness, tingling, weakness or ataxia. No seizure-type activity. PSYCHIATRIC: No anxiety, depression, insomnia.  PHYSICAL EXAMINATION: VITAL SIGNS: Blood pressure 133/79, pulse 70, resp. rate (!) 24, SpO2 98 %.  GENERAL: 75 y.o.-year-femalepatient,  well-developed, well-nourished lying in the bed in no acute distress. Mild weakness globally. Pleasant and cooperative.   HEENT: Head atraumatic, normocephalic. Pupils equal, round, reactive to light and accommodation. No scleral icterus. Extraocular muscles intact. Oropharynx is clear. Mucus membranes moist. NECK: Supple, full range of motion. No JVD, no bruit heard. No cervical lymphadenopathy. CHEST: Normal breath sounds bilaterally. No wheezing, rales, rhonchi or crackles. No use of accessory muscles of respiration.  No reproducible chest wall tenderness.  CARDIOVASCULAR: S1, S2 normal. No murmurs, rubs, or gallops appreciated. Cap refill <2 seconds. ABDOMEN: Soft, nontender, nondistended. No rebound, guarding, rigidity.  EXTREMITIES: Mild tenderness to the left before meals joint and muscle spasm of the left trapezius Full range of motion. No  pedal edema, cyanosis, or clubbing. NEUROLOGIC: Cranial nerves II through XII are grossly intact with no focal sensorimotor deficit.  PSYCHIATRIC: The patient is alert and oriented x 3. Normal affect, mood, thought content. SKIN: Warm, dry, and intact without obvious rash, lesion, or ulcer.  LABORATORY PANEL:  CBC  Recent Labs Lab 11/14/16 1608  WBC 7.0  HGB 11.0*  HCT 32.8*  PLT 320   ----------------------------------------------------------------------------------------------------------------- Chemistries  Recent Labs Lab 11/14/16 1608  NA 140  K 4.1  CL 106  CO2 27  GLUCOSE 147*  BUN 27*  CREATININE 1.39*  CALCIUM 9.6   ------------------------------------------------------------------------------------------------------------------ Cardiac Enzymes  Recent Labs Lab 11/14/16 1935  TROPONINI 0.03*   ------------------------------------------------------------------------------------------------------------------  RADIOLOGY: Dg Chest 2 View  Result Date: 11/14/2016 CLINICAL DATA:  Pt reports feeling tightness in her chest  today while doing physical therapy for ACDF surgery on 10-10-16. Hx of CHF, COPD, CAD, diabetes, HTN, pulmonary embolism, pulmonary sarcoidosis. Nonsmoker. EXAM: CHEST  2 VIEW COMPARISON:  06/29/2016 radiographs FINDINGS: Atherosclerotic calcification of the aortic arch. Lower cervical plate and screw fixator. Heart size within normal limits for technique. Left rib deformities from old fractures. Thoracic spondylosis. No blunting of the costophrenic angles. Mild chronic interstitial prominence although better than on 06/29/2016. IMPRESSION: 1.  Aortic Atherosclerosis (ICD10-I70.0). 2. Thoracic spondylosis. 3. Mild chronic interstitial accentuation without acute findings. Electronically Signed   By: Van Clines M.D.   On: 11/14/2016 16:35    EKG: Normal sinus rhythm at 71bpm with  leftward axis, LVH, right bundle branch block and nonspecific ST-T wave changes.   IMPRESSION AND PLAN:  This is a 76 y.o. female with a history of CAD, ischemic cardiomyopathyc, chronic diastolic CHF, COPD, DM, depression, HTN, HLD, sarcoidosis, CKD, OSA now being admitted with:  1. Chest pain, rule out ACS - Admit to observation with telemetry monitoring. - Trend troponins, check lipids and TSH. - Continue Cozaar, Imdur, Brilinta, metoprolol, aspirin, nitroglycerin, Crestor  - Check echo - Cardiology consult requested.   2. History of depression -Continue Paxil, Xanax  3. History of CHF -Continue Lasix, metoprolol  4. History of COPD -Can use Symbicort and Flonase  5. H/o Diabetes - Accuchecks achs with RISS coverage - Heart healthy, carb controlled diet -Continue 70/30  6. History of CKD - NS at [facility] - Monitor BMP  Admission status: Observation, telemetry Diet/Nutrition: Heart healthy Fluids: NS Consults: Cardio DVT Px: Lovenox, SCDs and early ambulation Code Status: Full Disposition Plan: To home in <24 hours  All the records are reviewed and case discussed with ED provider. Management  plans discussed with the patient and/or family who express understanding and agree with plan of care.   TOTAL TIME TAKING CARE OF THIS PATIENT: 60 minutes.   Arthuro Canelo D.O. on 11/14/2016 at 10:05 PM Between 7am to 6pm - Pager - [phone] After 6pm go to [email or web address] - Proofreader [employer] [facility] [place] [facility] Office [phone] CC: Primary care physician; [name], MD     Note: This dictation was prepared with Dragon dictation along with smaller phrase technology. Any transcriptional errors that result from this process are unintentional.

## 2016-11-14 NOTE — ED Notes (Signed)
Pamela Collier, husband.[phone]  Or [phone]

## 2016-11-14 NOTE — ED Notes (Signed)
Sandwich tray given 

## 2016-11-14 NOTE — ED Triage Notes (Signed)
Pt to ED via EMS from home, states started having lft sided chest tightness while in home PT was getting her to walk. Pt took 1 nitro at home, and 324 Asprin given in route. Pt denies any pain at this time. VS stable.

## 2016-11-14 NOTE — ED Notes (Signed)
0.03 troponin per lab, MD Archie Balboa notified

## 2016-11-14 NOTE — ED Provider Notes (Signed)
[facility] Emergency Department Provider Note  ____________________________________________   I have reviewed the triage vital signs and the nursing notes.   HISTORY  Chief Complaint Chest Pain   History limited by: Not Limited   HPI Pamela Collier is a 76 y.o. female who presents to the emergency department today via EMS because of concerns for chest pain. It started shortly after the patient was done with her physical therapy and when she was walking around her room. She states it is located in the center and left chest. She describes it as pressure-like. Not accompanied by any shortness of breath or diaphoresis. The patient states that it is now resolved. She did take a nitroglycerin at home and took aspirin via EMS. She denies similar pain in the past.    Past Medical History:  Diagnosis Date  . Acute respiratory failure ([place]) 11/21/2014  . Anxiety   . Arthritis 08/05/2013  . Atherosclerotic peripheral vascular disease with gangrene [facility]) august 2012  . Cardiomyopathy, ischemic    a. EF 35 to 40% by echo in 2013 b. EF improved to 50-55% by echo in 04/2015.  [name] Cerebral infarct ([place]) 08/17/2013  . Chronic diastolic CHF (congestive heart failure) (HCC)    a. EF 50-55% by echo in 04/2015  . COPD (chronic obstructive pulmonary disease) ([place])   . Coronary artery disease, occlusive    a. Previous PCI to the LAD, LCx, and RCA in 2010, 2011, 2013, and 2016. All performed in [place].  . Depression   . 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 Diabetic diarrhea ([place]) 10/03/2014  . DM type 2, uncontrolled, with renal complications ([place]) 11/11/2200  . Hepatic steatosis    by CT abd pelvis  . History of kidney stones    at a younger age  . Hyperlipidemia LDL goal <100 02/23/2014  . Hypertensive heart disease   . Osteoporosis, post-menopausal   . Peripheral vascular disease due to secondary diabetes mellitus 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  Pleural effusion 10/25/2012   10/2012 CT chest >> small to moderate R lung effusion>> chylothorax, 100% lymphs 10/2013 thoracentesis> cytology negative, WBC 1471, > 90% "small lymphs" 01/2014 CT chest> near complete resolution of pleural effusion, stable lymphadenopathy 08/2014 CT chest [place] ([facility]): small right sided effusion decreased in size, stable mediastinal lymphadenopathy 1.0cm largest, 27m  . 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 Renal insufficiency   . Sarcoidosis   . Sleep apnea     Patient Active Problem List   Diagnosis Date Noted  . History of fall 11/12/2016  . Osteoarthritis 11/10/2016  . Cervical stenosis of spinal canal 10/10/2016  . Cervical radiculopathy at C7 07/02/2016  . Acute midline thoracic back pain 07/02/2016  . Epigastric hernia 04/01/2016  . Umbilical pain 54/27/0623  . Coronary artery disease involving native coronary artery of native heart with unstable angina pectoris ([place])   . Type 2 DM with CKD stage 3 and hypertension ([place])   . Chronic renal insufficiency   . Shortness of breath 09/12/2015  . Hypertensive heart disease   . OSA on CPAP 07/07/2015  . Hospital discharge follow-up 07/07/2015  . Chest pain with  low risk of acute coronary syndrome 06/29/2015  . Pleural effusion, right   . Chronic diastolic CHF (congestive heart failure) ([place]) 04/18/2015  . Diabetic neuropathy ([place]) 02/21/2015  . Benign neoplasm of sigmoid colon   . Low back pain with radiation 04/04/2014  . Essential hypertension 04/04/2014  . Hyperlipidemia 02/23/2014  . Long-term use of high-risk medication 02/23/2014  . Dermatophytic onychia 08/17/2013  . Angina pectoris ([place]) 08/17/2013  . Arteriosclerosis of coronary artery  08/17/2013  . Cerebral infarct ([place]) 08/17/2013  . Bony exostosis 08/17/2013  . Arthritis 08/05/2013  . History of colonic polyps 02/20/2013  . Sarcoidosis ([place]) 12/07/2012  . Dysphagia 12/07/2012  . Nocturnal cough 10/25/2012  . Pulmonary nodule seen on imaging study 05/20/2012  . 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 Cardiomyopathy, ischemic   . Hepatic steatosis   . Osteoporosis, post-menopausal   . Peripheral vascular disease due to secondary diabetes mellitus ([place])   . DM (diabetes mellitus), type 2, uncontrolled, with renal complications ([place]) 36/62/9476  . Double vessel coronary artery disease 12/14/2011    Past Surgical History:  Procedure Laterality Date  . ABDOMINAL HYSTERECTOMY     at ge 39. secondary to bleeding/partial  . ANTERIOR CERVICAL DECOMP/DISCECTOMY FUSION N/A 10/10/2016   Procedure: Anterior Cervical Discectomy Fusion - Cervical four4- five - Cervical five-Cervical six - Cervical six-Cervical seven;  Surgeon: Earnie Larsson, MD;  Location: [place];  Service: Neurosurgery;  Laterality: N/A;  . BREAST CYST ASPIRATION Right   . CARPAL TUNNEL RELEASE Bilateral   . CHOLECYSTECTOMY     in [place]   . COLONOSCOPY WITH PROPOFOL N/A 01/09/2015   Procedure: COLONOSCOPY WITH PROPOFOL;  Surgeon: Lucilla Lame, MD;  Location: [facility] ENDOSCOPY;  Service: Endoscopy;  Laterality: N/A;  . CORONARY ANGIOPLASTY WITH STENT PLACEMENT     [place]; [facility]  . EYE SURGERY     bil cataracts  . HERNIA REPAIR     umbilical/ Dr Pat Patrick  . PTCA  August 2012   Right Posterior tibial artery , [place]  . TOE AMPUTATION  Sept 2012   Right 2nd toe, [place]    Prior to Admission medications   Medication Sig Start Date End Date Taking? Authorizing Provider  acetaminophen (TYLENOL) 500 MG tablet Take 1,000 mg by mouth every 6 (six) hours as needed for mild pain or headache.     [provider]  alendronate  (FOSAMAX) 70 MG tablet Take 1 tablet (70 mg total) by mouth once a week. Pt takes on Sunday.   Take with a full glass of water on an empty stomach. 07/01/16   [name], MD  ALPRAZolam [name]) 0.25 MG tablet Take 1 tablet (0.25 mg total) by mouth daily. 11/10/16   [name], MD  aspirin EC 81 MG tablet Take 81 mg by mouth daily.    [provider]  BRILINTA 90 MG TABS tablet TAKE 1 TABLET TWICE A DAY 06/30/16   [name], MD  budesonide-formoterol [facility]) 160-4.5 MCG/ACT inhaler Inhale 2 puffs into the lungs 2 (two) times daily. 02/23/15   Juanito Doom, MD  diclofenac sodium (VOLTAREN) 1 % GEL Apply 2 g topically 4 (four) times daily.    [provider]  docusate sodium (COLACE) 100 MG capsule Take 100 mg by mouth 2 (two) times daily as needed for mild constipation. Reported on 11/09/2015    [provider]  fluticasone (FLONASE) 50  MCG/ACT nasal spray Place 2 sprays into both nostrils daily as needed for rhinitis.    [provider]  furosemide (LASIX) 20 MG tablet Take 1 tablet (20 mg total) by mouth daily. Patient taking differently: Take 20 mg by mouth every other day.  06/11/16   [name], MD  glucose blood (ACCU-CHEK AVIVA PLUS) test strip USE TO CHECK GLUCOSE  THREE TIMES DAILY 04/29/16   [name], MD  HYDROcodone-acetaminophen (NORCO/VICODIN) 5-325 MG tablet Take 1-2 tablets by mouth every 4 (four) hours as needed (breakthrough pain). 10/13/16   Earnie Larsson, MD  Insulin Lispro Prot & Lispro (HUMALOG MIX 75/25 KWIKPEN) (75-25) 100 UNIT/ML Kwikpen Inject 45 to 60 units twice daily before morning and evening meals Patient taking differently: Inject 44 units in the morning and 60 units in the evening 11/27/15   [name], MD  isosorbide mononitrate (IMDUR) 30 MG 24 hr tablet TAKE 1 TABLET DAILY 07/16/16   [name], MD  losartan (COZAAR) 100 MG tablet TAKE 1 TABLET DAILY 09/11/16   [name], MD  metoprolol  succinate (TOPROL-XL) 25 MG 24 hr tablet Take 1 tablet (25 mg total) by mouth daily. 06/18/16   [name], MD  nitroGLYCERIN (NITROSTAT) 0.4 MG SL tablet Place 1 tablet (0.4 mg total) under the tongue every 5 (five) minutes as needed for chest pain. 04/18/15   Minna Merritts, MD  ondansetron (ZOFRAN) 4 MG tablet Take 1 tablet (4 mg total) by mouth every 8 (eight) hours as needed for nausea or vomiting. 05/18/16   Rudene Re, MD  PARoxetine (PAXIL) 20 MG tablet TAKE ONE TABLET BY MOUTH ONCE DAILY 06/25/16   [name], MD  polyethylene glycol (MIRALAX / GLYCOLAX) packet Take 17 g by mouth daily as needed for mild constipation. Reported on 10/25/2015    [provider]  pregabalin (LYRICA) 100 MG capsule Take 1 capsule (100 mg total) by mouth 3 (three) times daily. Patient taking differently: Take 100 mg by mouth 2 (two) times daily.  04/29/16   [name], MD  rosuvastatin (CRESTOR) 20 MG tablet Take 20 mg by mouth daily.     [provider]    Allergies Cephalexin; Contrast media [iodinated diagnostic agents]; Doxycycline; Gabapentin; and Sulfa antibiotics  Family History  Problem Relation Age of Onset  . Heart disease Father   . Heart attack Father   . Heart disease Sister   . Heart attack Sister   . Heart disease Brother   . Heart attack Brother   . Asthma Grandchild   . Breast cancer Maternal Aunt     Social History Social History  Substance Use Topics  . Smoking status: Never Smoker  . Smokeless tobacco: Never Used  . Alcohol use No    Review of Systems Constitutional: No fever/chills Eyes: Positive for "funny" vision this morning that has resolved ENT: No sore throat. Cardiovascular: Positive for chest pain. Respiratory: Denies shortness of breath. Gastrointestinal: No abdominal pain.  Positive for diarrhea a couple of weeks ago. Genitourinary: Negative for dysuria. Musculoskeletal: Negative for back pain. Skin: Negative for  rash. Neurological: Negative for headaches, focal weakness or numbness.  ____________________________________________   PHYSICAL EXAM:  VITAL SIGNS: ED Triage Vitals [11/14/16 1606]  Enc Vitals Group     BP 133/79     Pulse Rate 71     Resp 16     Temp      Temp src  SpO2 100 %    Constitutional: Alert and oriented. Well appearing and in no distress. Eyes: Conjunctivae are normal.  ENT   Head: Normocephalic and atraumatic.   Nose: No congestion/rhinnorhea.   Mouth/Throat: Mucous membranes are moist.   Neck: No stridor. Hematological/Lymphatic/Immunilogical: No cervical lymphadenopathy. Cardiovascular: Normal rate, regular rhythm.  No murmurs, rubs, or gallops.  Respiratory: Normal respiratory effort without tachypnea nor retractions. Breath sounds are clear and equal bilaterally. No wheezes/rales/rhonchi. Gastrointestinal: Soft and non tender. No rebound. No guarding.  Genitourinary: Deferred Musculoskeletal: Normal range of motion in all extremities. No lower extremity edema. Neurologic:  Normal speech and language. No gross focal neurologic deficits are appreciated.  Skin:  Skin is warm, dry and intact. No rash noted. Psychiatric: Mood and affect are normal. Speech and behavior are normal. Patient exhibits appropriate insight and judgment.  ____________________________________________    LABS (pertinent positives/negatives)  Labs Reviewed  BASIC METABOLIC PANEL - Abnormal; Notable for the following:       Result Value   Glucose, Bld 147 (*)    BUN 27 (*)    Creatinine, Ser 1.39 (*)    GFR calc non Af Amer 36 (*)    GFR calc Af Amer 42 (*)    All other components within normal limits  CBC - Abnormal; Notable for the following:    RBC 3.78 (*)    Hemoglobin 11.0 (*)    HCT 32.8 (*)    RDW 15.8 (*)    All other components within normal limits  TROPONIN I - Abnormal; Notable for the following:    Troponin I 0.03 (*)    All other components  within normal limits  TROPONIN I - Abnormal; Notable for the following:    Troponin I 0.03 (*)    All other components within normal limits     ____________________________________________   EKG  I, Nance Pear, attending physician, personally viewed and interpreted this EKG  EKG Time: 1608 Rate: 71 Rhythm: accelerated junctional rhythm Axis: left axis deviation Intervals: qtc 498 QRS: RBBB, LVH ST changes: no st elevation Impression: abnormal ekg   ____________________________________________    RADIOLOGY  CXR IMPRESSION:  1. Aortic Atherosclerosis (ICD10-I70.0).  2. Thoracic spondylosis.  3. Mild chronic interstitial accentuation without acute findings.   ____________________________________________   PROCEDURES  Procedures  ____________________________________________   INITIAL IMPRESSION / ASSESSMENT AND PLAN / ED COURSE  Pertinent labs & imaging results that were available during my care of the patient were reviewed by me and considered in my medical decision making (see chart for details).  Patient presented with chest pain. Initial ekg without any significant change from prior. Will check blood work including troponin.    ----------------------------------------- 4:48 PM on 11/14/2016 -----------------------------------------   OBSERVATION CARE: This patient is being placed under observation care for the following reasons: Chest pain with repeat testing to rule out ischemia  Initial troponin negative.  ----------------------------------------- 5:19 PM on 11/14/2016 -----------------------------------------  Patient continues to be chest pain-free. She is complaining of some left arm pain. Will trial Tylenol.  ----------------------------------------- 9:27 PM on 11/14/2016 -----------------------------------------   END OF OBSERVATION STATUS: After an appropriate period of observation, this patient is being admitted due to the  following reason(s):  Continued elevated troponin.    ____________________________________________   FINAL CLINICAL IMPRESSION(S) / ED DIAGNOSES  Final diagnoses:  Chest pain, unspecified type  Elevated troponin     Note: This dictation was prepared with Dragon dictation. Any transcriptional errors that result from this process are  unintentional     Nance Pear, MD 11/14/16 2128

## 2016-11-14 NOTE — ED Notes (Signed)
Pt states diabetic and hasn't had any food since lunch, cbg check 102

## 2016-11-15 ENCOUNTER — Observation Stay (HOSPITAL_BASED_OUTPATIENT_CLINIC_OR_DEPARTMENT_OTHER)
Admit: 2016-11-15 | Discharge: 2016-11-15 | Disposition: A | Discharge status: Home / Self Care | Payer: Medicare Other | Attending: Family Medicine | Admitting: Family Medicine

## 2016-11-15 ENCOUNTER — Encounter: Payer: Self-pay | Admitting: Radiology

## 2016-11-15 ENCOUNTER — Observation Stay (HOSPITAL_BASED_OUTPATIENT_CLINIC_OR_DEPARTMENT_OTHER): Payer: Medicare Other

## 2016-11-15 DIAGNOSIS — R079 Chest pain, unspecified: Secondary | ICD-10-CM | POA: Diagnosis not present

## 2016-11-15 DIAGNOSIS — I25118 Atherosclerotic heart disease of native coronary artery with other forms of angina pectoris: Secondary | ICD-10-CM | POA: Diagnosis not present

## 2016-11-15 DIAGNOSIS — E785 Hyperlipidemia, unspecified: Secondary | ICD-10-CM | POA: Diagnosis not present

## 2016-11-15 DIAGNOSIS — R072 Precordial pain: Secondary | ICD-10-CM | POA: Diagnosis not present

## 2016-11-15 DIAGNOSIS — N183 Chronic kidney disease, stage 3 unspecified: Secondary | ICD-10-CM

## 2016-11-15 DIAGNOSIS — I2510 Atherosclerotic heart disease of native coronary artery without angina pectoris: Secondary | ICD-10-CM | POA: Diagnosis not present

## 2016-11-15 DIAGNOSIS — I10 Essential (primary) hypertension: Secondary | ICD-10-CM | POA: Diagnosis not present

## 2016-11-15 LAB — NM MYOCAR MULTI W/SPECT W/WALL MOTION / EF
CHL CUP NUCLEAR SRS: 0
CSEPHR: 52 %
CSEPPHR: 76 {beats}/min
LV dias vol: 82 mL (ref 46–106)
LVSYSVOL: 47 mL
Rest HR: 69 {beats}/min
SDS: 4
SSS: 2
TID: 1.34

## 2016-11-15 LAB — LIPID PANEL
CHOL/HDL RATIO: 4.3 ratio
CHOLESTEROL: 124 mg/dL (ref 0–200)
HDL: 29 mg/dL — ABNORMAL LOW (ref >40)
LDL CALC: 60 mg/dL (ref 0–99)
Triglycerides: 175 mg/dL — ABNORMAL HIGH (ref <150)
VLDL: 35 mg/dL (ref 0–40)

## 2016-11-15 LAB — GLUCOSE, CAPILLARY
Glucose-Capillary: 151 mg/dL — ABNORMAL HIGH (ref 65–99)
Glucose-Capillary: 166 mg/dL — ABNORMAL HIGH (ref 65–99)

## 2016-11-15 LAB — ECHOCARDIOGRAM COMPLETE
Height: 67 in
WEIGHTICAEL: 2976 [oz_av]

## 2016-11-15 LAB — TROPONIN I: Troponin I: <0.03 ng/mL (ref <0.03)

## 2016-11-15 LAB — TSH: TSH: 0.953 u[IU]/mL (ref 0.350–4.500)

## 2016-11-15 MED ORDER — REGADENOSON 0.4 MG/5ML IV SOLN
0.4000 mg | Freq: Once | INTRAVENOUS | Status: AC
  Administered 2016-11-15: 0.4 mg via INTRAVENOUS

## 2016-11-15 MED ORDER — TECHNETIUM TC 99M TETROFOSMIN IV KIT
30.0000 | PACK | Freq: Once | INTRAVENOUS | Status: AC | PRN
  Administered 2016-11-15: 30.284 via INTRAVENOUS

## 2016-11-15 MED ORDER — ISOSORBIDE MONONITRATE ER 60 MG PO TB24
60.0000 mg | ORAL_TABLET | Freq: Every day | ORAL | Status: DC
  Administered 2016-11-15: 60 mg via ORAL
  Filled 2016-11-15: qty 1

## 2016-11-15 MED ORDER — TECHNETIUM TC 99M TETROFOSMIN IV KIT
13.0000 | PACK | Freq: Once | INTRAVENOUS | Status: AC | PRN
  Administered 2016-11-15: 12.87 via INTRAVENOUS

## 2016-11-15 NOTE — Care Management Obs Status (Signed)
[facility] NOTIFICATION   Patient Details  [name] MRN: [id number] Date of Birth: [birth year]   Medicare Observation Status Notification Given:  No [place] letter)   Discharged within 24 hours of admission    Lamonta Cypress A, RN 11/15/2016, 2:24 PM

## 2016-11-15 NOTE — Consult Note (Signed)
Cardiology Consultation Note  Patient ID: Pamela Collier, MRN: [id number], DOB/AGE: October 20, 1940 76 y.o. Admit date: 11/14/2016   Date of Consult: 11/15/2016 Primary Physician: [name], MD Primary Cardiologist: Dr. Rockey Situ, MD Requesting Physician: Dr. Ara Kussmaul, DO  Chief Complaint: Chest pain Reason for Consult: Chest pain  HPI: Pamela Collier is a 76 y.o. female who is being seen today for the evaluation of chest pain at the request of Dr. Ara Kussmaul, DO. Patient has a h/o CAD with remote stenting to the LAD, LCx, and most recently to the RCA in 2016 in [place] (details not available), chronic systolic CHF/ICM, pulmonary sarcoidosis, chronic chest pain, DM2, HTN, HLD, chronic neck pain s/p recent cervical diskectomy in 10/2016, PVD, reported CVA (patient denies), COPD, CKD stage I, prior pleural effusion, depression and anxiety who presented to [facility] on 7/6 with left-sided chest pain with ambulation.   Prior LHC in 05/2011 showed mid LAD 40% stenosis, distal LAD 50% stenosis, small diag with 70% stenosis, proximal LCx 50%, distal LCx 75% with patent stent, small RCA with 90% distal RCA. Details from her most recent [place] in 2016 in [place] are not on file. Most recent ischemic evaluation in 01/2016 secondary to chest pain showed no evidence of significant ischemia, normal wall motion, EF 55%, no EKG changes concerning for ischemia with baseline EKG showing IVCD and LAFB. Overall, this was a low risk scan. She most recently underwent echo in 12/2015 that showed EF 55-60%, grossly normal wall motion , though select images suggestive of moderate HK in the basal and mid inferior and posterior region. LA mildly dilated. PASP normal. This was an improved EF when compared to a prior echo in 08/2012 that showed an EF of 40-45%. She was most recently seen by Dr. Rockey Situ in 08/2012 for routine follow up with continued medical management advised. She recently underwent cervical diskectomy in 10/1948 without  complications, though has continued to note left arm pain.   She was in her usual state of health until working with physical therapy on 7/6. While ambulating around there periphery of the room x 3 she began to note some left-sided chest pain with associated SOB. Pain lasted approximately 10 minutes, did not improve with SL NTG, and resolved on its own. Some associated SOB, though no associated nausea, vomiting, diaphoresis, dizziness, presyncope, or syncope. Pain did not feel like her prior episodes of pain leading to her cardiac caths or stress testing. EMS was called by PT. She has not noticed any LE swelling, abdominal distension, orthopnea, or early satiety (though she does have a decrease appetite). She has also noted about a 1 month history of cough that has been productive of white sputum.   Upon the patient's arrival to [facility] they were found to have BP 133/79, HR 71 bpm, temp 98.5, oxygen saturation 98% on room air, weight 186 pounds (last clinic weight of 175 pounds in 08/2016). EKG showed new LBBB as below (prior RBBB), CXR showed no acute findings with aortic atherosclerosis. Labs showed troponin 0.03 x 3--><0.03, WBC 7.0, HGB 11.0, PLT 320, SCr 1.39 (baseline ~1.1), K+ 4.1 with hemolysis, TSH normal, LDL 60. In the ED she was given Tylenol and home medications. She has been chest pain free since her arrival to the ED. She continues to note left arm pain that is worse with movement.   Past Medical History:  Diagnosis Date  . Acute respiratory failure ([place]) 11/21/2014  . Anxiety   . Arthritis 08/05/2013  .  Atherosclerotic peripheral vascular disease with gangrene [facility]) august 2012  . Cardiomyopathy, ischemic    a. EF 35 to 40% by echo in 2013 b. EF improved to 50-55% by echo in 04/2015.  [name] Cerebral infarct ([place]) 08/17/2013  . Chronic diastolic CHF (congestive heart failure) (HCC)    a. EF 50-55% by echo in 04/2015  . COPD (chronic obstructive pulmonary disease) ([place])   . Coronary artery  disease, occlusive    a. Previous PCI to the LAD, LCx, and RCA in 2010, 2011, 2013, and 2016. All performed in [place].  . Depression   . 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 Diabetic diarrhea ([place]) 10/03/2014  . DM type 2, uncontrolled, with renal complications ([place]) 11/16/6752  . Hepatic steatosis    by CT abd pelvis  . History of kidney stones    at a younger age  . Hyperlipidemia LDL goal <100 02/23/2014  . Hypertensive heart disease   . Osteoporosis, post-menopausal   . 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 Pleural effusion 10/25/2012   10/2012 CT chest >> small to moderate R lung effusion>> chylothorax, 100% lymphs 10/2013 thoracentesis> cytology negative, WBC 1471, > 90% "small lymphs" 01/2014 CT chest> near complete resolution of pleural effusion, stable lymphadenopathy 08/2014 CT chest [place] ([facility]): small right sided effusion decreased in size, stable mediastinal lymphadenopathy 1.0cm largest, 56m  . Pulmonary sarcoidosis ([place]) 12/07/2012   Diagnosed over 20 years ago in [place] with a mediastinal biopsy 03/2013 Full PFT [facility] > UNACCEPTABLE AND NOT REPRODUCIBLE DATA> Ratio 71% FEV 1 1.02 L (55% pred), FVC 1.31 L (49% pred) could not do lung volumes or DLCO   . Renal insufficiency   . Sarcoidosis   . Sleep apnea       Most Recent Cardiac Studies: Lexiscan Myoview 01/2016: Study Result   Pharmacological myocardial perfusion imaging study with no significant  ischemia Normal wall motion, EF estimated at 55% No EKG changes concerning for ischemia at peak stress or in recovery. Baseline EKG with IVCD and LAFB Low risk scan   TTE 12/2015: Study Conclusions  - Procedure narrative: Transthoracic echocardiography. The study   was technically difficult. - Left ventricle: The cavity size was normal. There was moderate   concentric hypertrophy. Systolic function was  normal. The   estimated ejection fraction was in the range of 55% to 60%.   Grossly normal wall motion with parasternal short axis images   suggestive of moderate hypokinesis in the basal to mid inferior   and posterior region. Not well visualized on apical images. Left   ventricular diastolic function parameters were normal. - Left atrium: The atrium was mildly dilated. - Right ventricle: Systolic function was normal. - Pulmonary arteries: Systolic pressure was within the normal   range.  Impressions:  - Wall motion abnormality possibly secondary to conduction   abnormality.   Surgical History:  Past Surgical History:  Procedure Laterality Date  . ABDOMINAL HYSTERECTOMY     at ge 39. secondary to bleeding/partial  . ANTERIOR CERVICAL DECOMP/DISCECTOMY FUSION N/A 10/10/2016   Procedure: Anterior Cervical Discectomy Fusion - Cervical four4- five - Cervical five-Cervical six - Cervical six-Cervical seven;  Surgeon: Earnie Larsson, MD;  Location: [place];  Service: Neurosurgery;  Laterality: N/A;  . BREAST CYST ASPIRATION Right   . CARPAL TUNNEL RELEASE Bilateral   . CHOLECYSTECTOMY     in [place]   .  COLONOSCOPY WITH PROPOFOL N/A 01/09/2015   Procedure: COLONOSCOPY WITH PROPOFOL;  Surgeon: Lucilla Lame, MD;  Location: [facility];  Service: Endoscopy;  Laterality: N/A;  . CORONARY ANGIOPLASTY WITH STENT PLACEMENT     [place]; [facility]  . EYE SURGERY     bil cataracts  . HERNIA REPAIR     umbilical/ Dr Pat Patrick  . PTCA  August 2012   Right Posterior tibial artery , [place]  . TOE AMPUTATION  Sept 2012   Right 2nd toe, [place]     Home Meds: Prior to Admission medications   Medication Sig Start Date End Date Taking? Authorizing Provider  acetaminophen (TYLENOL) 500 MG tablet Take 1,000 mg by mouth every 6 (six) hours as needed for mild pain or headache.    Yes [provider]  alendronate (FOSAMAX) 70 MG tablet Take 1 tablet (70 mg total) by mouth  once a week. Pt takes on Sunday.   Take with a full glass of water on an empty stomach. 07/01/16  Yes [name], MD  ALPRAZolam [name]) 0.25 MG tablet Take 1 tablet (0.25 mg total) by mouth daily. 11/10/16  Yes [name], MD  aspirin EC 81 MG tablet Take 81 mg by mouth daily.   Yes [provider]  BRILINTA 90 MG TABS tablet TAKE 1 TABLET TWICE A DAY 06/30/16  Yes [name], MD  budesonide-formoterol [facility]) 160-4.5 MCG/ACT inhaler Inhale 2 puffs into the lungs 2 (two) times daily. 02/23/15  Yes Juanito Doom, MD  diclofenac sodium (VOLTAREN) 1 % GEL Apply 2 g topically 4 (four) times daily.   Yes [provider]  docusate sodium (COLACE) 100 MG capsule Take 100 mg by mouth 2 (two) times daily as needed for mild constipation. Reported on 11/09/2015   Yes [provider]  fluticasone (FLONASE) 50 MCG/ACT nasal spray Place 2 sprays into both nostrils daily as needed for rhinitis.   Yes [provider]  furosemide (LASIX) 20 MG tablet Take 1 tablet (20 mg total) by mouth daily. Patient taking differently: Take 20 mg by mouth every other day.  06/11/16  Yes [name], MD  glucose blood (ACCU-CHEK AVIVA PLUS) test strip USE TO CHECK GLUCOSE  THREE TIMES DAILY 04/29/16  Yes [name], MD  HYDROcodone-acetaminophen (NORCO/VICODIN) 5-325 MG tablet Take 1-2 tablets by mouth every 4 (four) hours as needed (breakthrough pain). 10/13/16  Yes Pool, Mallie Mussel, MD  Insulin Lispro Prot & Lispro (HUMALOG MIX 75/25 KWIKPEN) (75-25) 100 UNIT/ML Kwikpen Inject 45 to 60 units twice daily before morning and evening meals Patient taking differently: Inject 44 units in the morning and 60 units in the evening 11/27/15  Yes [name], MD  isosorbide mononitrate (IMDUR) 30 MG 24 hr tablet TAKE 1 TABLET DAILY 07/16/16  Yes [name], MD  losartan (COZAAR) 100 MG tablet TAKE 1 TABLET DAILY 09/11/16  Yes [name], MD  metoprolol succinate (TOPROL-XL) 25  MG 24 hr tablet Take 1 tablet (25 mg total) by mouth daily. 06/18/16  Yes [name], MD  nitroGLYCERIN (NITROSTAT) 0.4 MG SL tablet Place 1 tablet (0.4 mg total) under the tongue every 5 (five) minutes as needed for chest pain. 04/18/15  Yes Gollan, Kathlene November, MD  ondansetron (ZOFRAN) 4 MG tablet Take 1 tablet (4 mg total) by mouth every 8 (eight) hours as needed for nausea or vomiting. 05/18/16  Yes Alfred Levins, [place], MD  PARoxetine (PAXIL) 20  MG tablet TAKE ONE TABLET BY MOUTH ONCE DAILY 06/25/16  Yes [name], MD  polyethylene glycol (MIRALAX / GLYCOLAX) packet Take 17 g by mouth daily as needed for mild constipation. Reported on 10/25/2015   Yes [provider]  pregabalin (LYRICA) 100 MG capsule Take 1 capsule (100 mg total) by mouth 3 (three) times daily. Patient taking differently: Take 100 mg by mouth 2 (two) times daily.  04/29/16  Yes [name], MD  rosuvastatin (CRESTOR) 20 MG tablet Take 20 mg by mouth daily.    Yes [provider]    Inpatient Medications:  . ALPRAZolam  0.25 mg Oral Daily  . aspirin EC  81 mg Oral Daily  . furosemide  20 mg Oral QODAY  . heparin  5,000 Units Subcutaneous Q8H  . insulin aspart  0-15 Units Subcutaneous TID WC  . insulin aspart  0-5 Units Subcutaneous QHS  . insulin aspart protamine- aspart  44 Units Subcutaneous QPC breakfast   And  . insulin aspart protamine- aspart  60 Units Subcutaneous QPM  . isosorbide mononitrate  30 mg Oral Daily  . losartan  100 mg Oral Daily  . metoprolol succinate  25 mg Oral Daily  . mometasone-formoterol  2 puff Inhalation BID  . PARoxetine  20 mg Oral Daily  . pregabalin  100 mg Oral BID  . rosuvastatin  20 mg Oral QHS  . ticagrelor  90 mg Oral BID   . sodium chloride 10 mL/hr at 11/15/16 0020    Allergies:  Allergies  Allergen Reactions  . Cephalexin Itching  . Contrast Media [Iodinated Diagnostic Agents] Itching  . Doxycycline Itching  . Gabapentin Itching  . Sulfa  Antibiotics Itching    Social History   Social History  . Marital status: Married    Spouse name: N/A  . Number of children: N/A  . Years of education: N/A   Occupational History  . retired    Social History Main Topics  . Smoking status: Never Smoker  . Smokeless tobacco: Never Used  . Alcohol use No  . Drug use: No  . Sexual activity: No   Other Topics Concern  . Not on file   Social History Narrative  . No narrative on file     Family History  Problem Relation Age of Onset  . Heart disease Father   . Heart attack Father   . Heart disease Sister   . Heart attack Sister   . Heart disease Brother   . Heart attack Brother   . Asthma Grandchild   . Breast cancer Maternal Aunt      Review of Systems: Review of Systems  Constitutional: Positive for malaise/fatigue. Negative for chills, diaphoresis, fever and weight loss.  HENT: Negative for congestion.   Eyes: Negative for discharge and redness.  Respiratory: Positive for shortness of breath. Negative for cough, hemoptysis, sputum production and wheezing.   Cardiovascular: Positive for chest pain. Negative for palpitations, orthopnea, claudication, leg swelling and PND.  Gastrointestinal: Negative for abdominal pain, blood in stool, heartburn, melena, nausea and vomiting.  Genitourinary: Negative for hematuria.  Musculoskeletal: Positive for joint pain and neck pain. Negative for falls and myalgias.       Left arm pain worse with movement  Skin: Negative for rash.  Neurological: Positive for tremors and weakness. Negative for dizziness, tingling, sensory change, speech change, focal weakness and loss of consciousness.  Endo/Heme/Allergies: Does not bruise/bleed easily.  Psychiatric/Behavioral: Negative for substance abuse. The patient  is not nervous/anxious.   All other systems reviewed and are negative.   Labs:  Recent Labs  11/14/16 1608 11/14/16 1935 11/14/16 2304 11/15/16 0540  TROPONINI 0.03* 0.03*  0.03* <0.03   Lab Results  Component Value Date   WBC 7.0 11/14/2016   HGB 11.0 (L) 11/14/2016   HCT 32.8 (L) 11/14/2016   MCV 86.8 11/14/2016   PLT 320 11/14/2016     Recent Labs Lab 11/14/16 1608  NA 140  K 4.1  CL 106  CO2 27  BUN 27*  CREATININE 1.39*  CALCIUM 9.6  GLUCOSE 147*   Lab Results  Component Value Date   CHOL 124 11/15/2016   HDL 29 (L) 11/15/2016   LDLCALC 60 11/15/2016   TRIG 175 (H) 11/15/2016   No results found for: DDIMER  Radiology/Studies:  Dg Chest 2 View  Result Date: 11/14/2016 IMPRESSION: 1.  Aortic Atherosclerosis (ICD10-I70.0). 2. Thoracic spondylosis. 3. Mild chronic interstitial accentuation without acute findings. Electronically Signed   By: Van Clines M.D.   On: 11/14/2016 16:35    EKG: Interpreted by me showed: NSR, 63 bpm, LBBB (prior RBBB) Telemetry: Interpreted by me showed: NSR, LBBB  Weights: Filed Weights   11/14/16 2309  Weight: 186 lb (84.4 kg)     Physical Exam: Blood pressure (!) 139/45, pulse 67, temperature 98.1 F (36.7 C), temperature source Oral, resp. rate 18, height 5\' 7"  (1.702 m), weight 186 lb (84.4 kg), SpO2 100 %. Body mass index is 29.13 kg/m. General: Frail appearing, in no acute distress. Head: Normocephalic, atraumatic, sclera non-icteric, no xanthomas, nares are without discharge.  Neck: Negative for carotid bruits. JVD not elevated. Lungs: Clear bilaterally to auscultation without wheezes, rales, or rhonchi. Breathing is unlabored. Heart: RRR with S1 S2. No murmurs, rubs, or gallops appreciated. Abdomen: Soft, non-tender, non-distended with normoactive bowel sounds. No hepatomegaly. No rebound/guarding. No obvious abdominal masses. Msk:  Strength and tone appear normal for age. Extremities: No clubbing or cyanosis. No edema. Distal pedal pulses are 2+ and equal bilaterally. Neuro: Alert and oriented X 3. No facial asymmetry. No focal deficit. Moves all extremities spontaneously. Slight  resting tremor.  Psych:  Responds to questions appropriately with a normal affect.    Assessment and Plan:  Principal Problem:   Stable angina (HCC) Active Problems:   Cardiomyopathy, ischemic   CAD in native artery   Sarcoidosis ([place])   Essential hypertension   Chronic kidney disease (CKD), stage I   Hyperlipidemia    1. Stable angina/CAD in native artery/elevated troponin: -Currently chest pain free -Troponin minimally elevated at 0.03 x 3, flat trending and now negative -Continue ASA 81 mg daily and Brilinta 90 mg bid -EKG with possible new LBBB -Schedule for Lexiscan Myoview this AM to evaluate for high-risk ischemia -If Lexiscan is low risk consider decreasing Brilinta to 60 mg bid -Check TTE  -Increase Imdur to 60 mg daily -Consider addition of Ranexa if has refractory pain -No indication for heparin gtt at this time -Continue metoprolol and Crestor  2. ICM: -She does not appear grossly volume overloaded, though her weight is up 11 pounds from last clinic visit in 08/2016 -Has been quite sedentary at home -Check TTE -Continue Lasix, Toprol, losartan  3. Sarcoidosis: -Stable  4. CKD stage I: -Gentle IV hydration -Monitor  5. HTN: -Modestly controlled -Increase Imdur as above -Continue BB and ARB -If EF is normal, consider CCB if needed  6. HLD: -Crestor    Signed, Christell Faith, PA-C [facility]  Pager: [phone] 11/15/2016, 8:51 AM   I have personally seen and examined this patient with Christell Faith, PA-C. I agree with the assessment and plan as outlined above. Pamela Collier is a 76 yo female with history of CAD, reported ischemic cardiomyopathy, chronic systolic CHF, pulmonary sarcoidosis, DM, HTN, HLD, chronic neck and chest pain, PAD, COPD, CKD and anxiety. She presented to [facility] with c/o chest pain x 10 minutes. She had resolution of chest pain with no recurrence. She does endorse left arm pain, worse with movement. She is known to have CAD with prior  stenting of all three major epicardial vessels. Last stress test in September 2017 with no ischemia. Echo today with normal LV systolic function.  Labs reviewed by me. Of note, troponin negative x 3. Creatinine 1.39.  EKG reviewed by me and shows: Sinus rhythm, 1st degree AV block. LBBB (new) Telemetry reviewed by me and shows: sinus rhythm  My exam shows:  General: Well developed, well nourished, NAD  HEENT: OP clear, mucus membranes moist  SKIN: warm, dry. No rashes. Neuro: No focal deficits  Musculoskeletal: Muscle strength 5/5 all ext  Psychiatric: Mood and affect normal  Neck: No JVD, no carotid bruits, no thyromegaly, no lymphadenopathy.  Lungs:Clear bilaterally, no wheezes, rhonci, crackles Cardiovascular: Regular rate and rhythm. No murmurs, gallops or rubs. Abdomen:Soft. Bowel sounds present. Non-tender.  Extremities: No lower extremity edema. Pulses are 2 + in the bilateral DP/PT.  Plan:  1. CAD/Chest pain: She has history of diffuse CAD with prior stenting of all three vessels. Her chest pain is now resolved. Troponin is essentially negative (0.03, 0.03, 0.03 and now less than 0.03). Echo with normal LV function. New LBBB but with negative troponin, unlikely to be due to ACS. She is getting a nuclear stress test today. If stress test is abnormal, will need consideration for cardiac cath. If stress test is negative for ischemia, could d/c home today and plan close cardiac follow up with DR. Gollan.   Pamela Collier 11/15/2016 10:39 AM

## 2016-11-15 NOTE — Progress Notes (Signed)
Pt is now able to eat and is order'd to be discharged home today. She has not received her 70/30 insulin this AM due to being NPO. Diabetes coordinator called Almyra Free) and she stated that I should give pt correction insulin to cover what her blood sugar is now, hold her 70/30 AM dose, and to direct pt to take her 70/30 PM dose as normally scheduled. Dr. Posey Pronto notified via text page.

## 2016-11-15 NOTE — Progress Notes (Signed)
Pt due to have SSI and 70/30. Dr. Posey Pronto paged, per her order pt will only require 70/30 insulin this AM. Pt currently off the floor for cardiac stress test and echocardiogram, will administer medications once pt returns.

## 2016-11-15 NOTE — Progress Notes (Signed)
Pt is to be discharged home today. Pt is in NAD, IV is out, all paperwork has been reviewed/discussed with patient, and there are no questions/concerns at this time. Assessment is unchanged from this morning. Pt is to be accompanied downstairs by staff and family via wheelchair.  

## 2016-11-15 NOTE — Discharge Summary (Signed)
[facility] [name]    MR#:  [id number]  DATE OF BIRTH:  [birth year]  DATE OF ADMISSION:  11/14/2016 ADMITTING PHYSICIAN: Harvie Bridge, DO  DATE OF DISCHARGE: 11/15/16  PRIMARY CARE PHYSICIAN: [name], MD    ADMISSION DIAGNOSIS:  Elevated troponin [R74.8] Chest pain, unspecified type [R07.9]  DISCHARGE DIAGNOSIS:  Angina-stable  CAD Ischemic CMP SECONDARY DIAGNOSIS:   Past Medical History:  Diagnosis Date  . Acute respiratory failure ([place]) 11/21/2014  . Anxiety   . Arthritis 08/05/2013  . Atherosclerotic peripheral vascular disease with gangrene [facility]) august 2012  . Cardiomyopathy, ischemic    a. EF 35 to 40% by echo in 2013 b. EF improved to 50-55% by echo in 04/2015.  [name] Cerebral infarct ([place]) 08/17/2013  . Chronic diastolic CHF (congestive heart failure) (HCC)    a. EF 50-55% by echo in 04/2015  . COPD (chronic obstructive pulmonary disease) ([place])   . Coronary artery disease, occlusive    a. Previous PCI to the LAD, LCx, and RCA in 2010, 2011, 2013, and 2016. All performed in [place].  . Depression   . 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 Diabetic diarrhea ([place]) 10/03/2014  . DM type 2, uncontrolled, with renal complications ([place]) 07/17/8586  . Hepatic steatosis    by CT abd pelvis  . History of kidney stones    at a younger age  . Hyperlipidemia LDL goal <100 02/23/2014  . Hypertensive heart disease   . Osteoporosis, post-menopausal   . 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 Pleural effusion 10/25/2012   10/2012 CT chest >> small to moderate R lung effusion>> chylothorax, 100% lymphs 10/2013 thoracentesis> cytology negative, WBC 1471, > 90% "small lymphs" 01/2014 CT chest> near complete resolution of pleural effusion, stable lymphadenopathy 08/2014 CT chest [place] ([facility]): small  right sided effusion decreased in size, stable mediastinal lymphadenopathy 1.0cm largest, 54m  . 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 Renal insufficiency   . Sarcoidosis   . Sleep apnea     HOSPITAL COURSE:   76 y.o. female with a history of CAD, ischemic cardiomyopathyc, chronic diastolic CHF, COPD, DM, depression, HTN, HLD, sarcoidosis, CKD, OSA now being admitted with:  1. Chest pain appears stable angina CP free -CE negative Myoview stress test negative - Continue Cozaar, Imdur, Brilinta, metoprolol, aspirin, nitroglycerin, Crestor  - Cardiology consult appreciated from Dr Angelena Form  2. History of depression -Continue Paxil, Xanax  3. History of CHF -Continue Lasix, metoprolol  4. History of COPD -Can use Symbicort and Flonase  5. H/o Diabetes - Accuchecks achs with RISS coverage - Heart healthy, carb controlled diet -Continue 70/30  6. History of CKD stabl E  Overall stable.ok to d/c from cardiac standpoint CONSULTS OBTAINED:  Treatment Team:  Burnell Blanks, MD  DRUG ALLERGIES:   Allergies  Allergen Reactions  . Cephalexin Itching  . Contrast Media [Iodinated Diagnostic Agents] Itching  . Doxycycline Itching  . Gabapentin Itching  . Sulfa Antibiotics Itching    DISCHARGE MEDICATIONS:   Current Discharge Medication List    CONTINUE  these medications which have NOT CHANGED   Details  acetaminophen (TYLENOL) 500 MG tablet Take 1,000 mg by mouth every 6 (six) hours as needed for mild pain or headache.     alendronate (FOSAMAX) 70 MG tablet Take 1 tablet (70 mg total) by mouth once a week. Pt takes on Sunday.   Take with a full glass of water on an empty stomach. Qty: 4 tablet, Refills: 11    ALPRAZolam (XANAX)  0.25 MG tablet Take 1 tablet (0.25 mg total) by mouth daily. Qty: 30 tablet, Refills: 2    aspirin EC 81 MG tablet Take 81 mg by mouth daily.    BRILINTA 90 MG TABS tablet TAKE 1 TABLET TWICE A DAY Qty: 180 tablet, Refills: 1   Associated Diagnoses: Cardiomyopathy, ischemic    budesonide-formoterol (SYMBICORT) 160-4.5 MCG/ACT inhaler Inhale 2 puffs into the lungs 2 (two) times daily. Qty: 1 Inhaler, Refills: 0    diclofenac sodium (VOLTAREN) 1 % GEL Apply 2 g topically 4 (four) times daily.    docusate sodium (COLACE) 100 MG capsule Take 100 mg by mouth 2 (two) times daily as needed for mild constipation. Reported on 11/09/2015    fluticasone (FLONASE) 50 MCG/ACT nasal spray Place 2 sprays into both nostrils daily as needed for rhinitis.    furosemide (LASIX) 20 MG tablet Take 1 tablet (20 mg total) by mouth daily. Qty: 90 tablet, Refills: 1   Associated Diagnoses: Cardiomyopathy, ischemic    glucose blood (ACCU-CHEK AVIVA PLUS) test strip USE TO CHECK GLUCOSE  THREE TIMES DAILY Qty: 300 each, Refills: 3    HYDROcodone-acetaminophen (NORCO/VICODIN) 5-325 MG tablet Take 1-2 tablets by mouth every 4 (four) hours as needed (breakthrough pain). Qty: 30 tablet, Refills: 0    Insulin Lispro Prot & Lispro (HUMALOG MIX 75/25 KWIKPEN) (75-25) 100 UNIT/ML Kwikpen Inject 45 to 60 units twice daily before morning and evening meals Qty: 112 mL, Refills: 3    isosorbide mononitrate (IMDUR) 30 MG 24 hr tablet TAKE 1 TABLET DAILY Qty: 90 tablet, Refills: 1    losartan (COZAAR) 100 MG tablet TAKE 1 TABLET DAILY Qty: 90 tablet, Refills: 0    metoprolol succinate (TOPROL-XL) 25 MG 24 hr tablet Take 1 tablet (25 mg total) by mouth daily. Qty: 90 tablet, Refills: 3    nitroGLYCERIN (NITROSTAT) 0.4 MG SL tablet Place 1 tablet (0.4 mg total) under the tongue every 5 (five) minutes as needed for chest pain. Qty: 25 tablet, Refills: 3    ondansetron (ZOFRAN) 4 MG tablet Take 1 tablet (4 mg total) by  mouth every 8 (eight) hours as needed for nausea or vomiting. Qty: 20 tablet, Refills: 0    PARoxetine (PAXIL) 20 MG tablet TAKE ONE TABLET BY MOUTH ONCE DAILY Qty: 90 tablet, Refills: 1    polyethylene glycol (MIRALAX / GLYCOLAX) packet Take 17 g by mouth daily as needed for mild constipation. Reported on 10/25/2015    pregabalin (LYRICA) 100 MG capsule Take 1 capsule (100 mg total) by mouth 3 (three) times daily. Qty: 270 capsule, Refills: 2    rosuvastatin (CRESTOR) 20 MG tablet Take 20 mg by mouth daily.         If you experience worsening of your admission symptoms, develop shortness of breath, life threatening emergency, suicidal or homicidal thoughts you must seek medical attention immediately by calling 911 or calling your MD immediately  if symptoms less severe.  You Must read complete instructions/literature along with all the possible adverse reactions/side effects for  all the Medicines you take and that have been prescribed to you. Take any new Medicines after you have completely understood and accept all the possible adverse reactions/side effects.   Please note  You were cared for by a hospitalist during your hospital stay. If you have any questions about your discharge medications or the care you received while you were in the hospital after you are discharged, you can call the unit and asked to speak with the hospitalist on call if the hospitalist that took care of you is not available. Once you are discharged, your primary care physician will handle any further medical issues. Please note that NO REFILLS for any discharge medications will be authorized once you are discharged, as it is imperative that you return to your primary care physician (or establish a relationship with a primary care physician if you do not have one) for your aftercare needs so that they can reassess your need for medications and monitor your lab values. Today   SUBJECTIVE   Cp free this am  VITAL  SIGNS:  Blood pressure (!) 160/65, pulse 73, temperature 98 F (36.7 C), temperature source Oral, resp. rate 18, height 5\' 7"  (1.702 m), weight 84.4 kg (186 lb), SpO2 100 %.  I/O:   Intake/Output Summary (Last 24 hours) at 11/15/16 1332 Last data filed at 11/15/16 0449  Gross per 24 hour  Intake            90.42 ml  Output                0 ml  Net            90.42 ml    PHYSICAL EXAMINATION:  GENERAL:  76 y.o.-year-old patient lying in the bed with no acute distress. obese EYES: Pupils equal, round, reactive to light and accommodation. No scleral icterus. Extraocular muscles intact.  HEENT: Head atraumatic, normocephalic. Oropharynx and nasopharynx clear.  NECK:  Supple, no jugular venous distention. No thyroid enlargement, no tenderness.  LUNGS: Normal breath sounds bilaterally, no wheezing, rales,rhonchi or crepitation. No use of accessory muscles of respiration.  CARDIOVASCULAR: S1, S2 normal. No murmurs, rubs, or gallops.  ABDOMEN: Soft, non-tender, non-distended. Bowel sounds present. No organomegaly or mass.  EXTREMITIES: No pedal edema, cyanosis, or clubbing.  NEUROLOGIC: Cranial nerves II through XII are intact. Muscle strength 5/5 in all extremities. Sensation intact. Gait not checked.  PSYCHIATRIC: The patient is alert and oriented x 3.  SKIN: No obvious rash, lesion, or ulcer.   DATA REVIEW:   CBC   Recent Labs Lab 11/14/16 1608  WBC 7.0  HGB 11.0*  HCT 32.8*  PLT 320    Chemistries   Recent Labs Lab 11/14/16 1608  NA 140  K 4.1  CL 106  CO2 27  GLUCOSE 147*  BUN 27*  CREATININE 1.39*  CALCIUM 9.6    Microbiology Results   No results found for this or any previous visit (from the past 240 hour(s)).  RADIOLOGY:  Dg Chest 2 View  Result Date: 11/14/2016 CLINICAL DATA:  Pt reports feeling tightness in her chest today while doing physical therapy for ACDF surgery on 10-10-16. Hx of CHF, COPD, CAD, diabetes, HTN, pulmonary embolism, pulmonary  sarcoidosis. Nonsmoker. EXAM: CHEST  2 VIEW COMPARISON:  06/29/2016 radiographs FINDINGS: Atherosclerotic calcification of the aortic arch. Lower cervical plate and screw fixator. Heart size within normal limits for technique. Left rib deformities from old fractures. Thoracic spondylosis. No blunting of the costophrenic angles. Mild chronic  interstitial prominence although better than on 06/29/2016. IMPRESSION: 1.  Aortic Atherosclerosis (ICD10-I70.0). 2. Thoracic spondylosis. 3. Mild chronic interstitial accentuation without acute findings. Electronically Signed   By: Van Clines M.D.   On: 11/14/2016 16:35   Nm Myocar Multi W/spect W/wall Motion / Ef  Result Date: 11/15/2016 Pharmacological myocardial perfusion imaging study with no significant  ischemia Normal wall motion, EF estimated at 43% No EKG changes concerning for ischemia at peak stress or in recovery. Low risk scan Signed, Esmond Plants, MD, Ph.D [facility]     Management plans discussed with the patient, family and they are in agreement.  CODE STATUS:     Code Status Orders        Start     Ordered   11/14/16 2250  Full code  Continuous     11/14/16 2249    Code Status History    Date Active Date Inactive Code Status Order ID Comments User Context   10/10/2016  1:25 PM 10/13/2016  8:20 PM Full Code [id number]  Earnie Larsson, MD Inpatient   06/11/2016  9:42 PM 06/12/2016  7:23 PM Full Code [id number]  Calhoun, Ubaldo Glassing, DO ED   01/02/2016  5:24 AM 01/02/2016  9:14 AM Full Code [id number]  Saundra Shelling, MD Inpatient   06/26/2015  7:36 AM 06/29/2015  7:54 PM Full Code [id number]  Demetrios Loll, MD ED   04/20/2015  1:18 AM 04/22/2015  7:18 PM Full Code [id number]  Lance Coon, MD Inpatient    Advance Directive Documentation     Most Recent Value  Type of Advance Directive  Living will  Pre-existing out of facility DNR order (yellow form or pink MOST form)  -  "MOST" Form in Place?  -      TOTAL TIME TAKING CARE OF THIS PATIENT:  40 minutes.    Tyron Manetta M.D on 11/15/2016 at 1:32 PM  Between 7am to 6pm - Pager - [phone] After 6pm go to [email or web address] - password [email or web address]  Office  [phone]  CC: Primary care physician; [name], MD

## 2016-11-16 LAB — GLUCOSE, CAPILLARY: GLUCOSE-CAPILLARY: 172 mg/dL — AB (ref 65–99)

## 2016-11-17 DIAGNOSIS — I130 Hypertensive heart and chronic kidney disease with heart failure and stage 1 through stage 4 chronic kidney disease, or unspecified chronic kidney disease: Secondary | ICD-10-CM | POA: Diagnosis not present

## 2016-11-17 DIAGNOSIS — I5032 Chronic diastolic (congestive) heart failure: Secondary | ICD-10-CM | POA: Diagnosis not present

## 2016-11-17 DIAGNOSIS — Z4789 Encounter for other orthopedic aftercare: Secondary | ICD-10-CM | POA: Diagnosis not present

## 2016-11-17 DIAGNOSIS — E1140 Type 2 diabetes mellitus with diabetic neuropathy, unspecified: Secondary | ICD-10-CM | POA: Diagnosis not present

## 2016-11-17 DIAGNOSIS — M4802 Spinal stenosis, cervical region: Secondary | ICD-10-CM | POA: Diagnosis not present

## 2016-11-17 DIAGNOSIS — E1151 Type 2 diabetes mellitus with diabetic peripheral angiopathy without gangrene: Secondary | ICD-10-CM | POA: Diagnosis not present

## 2016-11-18 DIAGNOSIS — I130 Hypertensive heart and chronic kidney disease with heart failure and stage 1 through stage 4 chronic kidney disease, or unspecified chronic kidney disease: Secondary | ICD-10-CM | POA: Diagnosis not present

## 2016-11-18 DIAGNOSIS — I5032 Chronic diastolic (congestive) heart failure: Secondary | ICD-10-CM | POA: Diagnosis not present

## 2016-11-18 DIAGNOSIS — E1151 Type 2 diabetes mellitus with diabetic peripheral angiopathy without gangrene: Secondary | ICD-10-CM | POA: Diagnosis not present

## 2016-11-18 DIAGNOSIS — M4802 Spinal stenosis, cervical region: Secondary | ICD-10-CM | POA: Diagnosis not present

## 2016-11-18 DIAGNOSIS — E1140 Type 2 diabetes mellitus with diabetic neuropathy, unspecified: Secondary | ICD-10-CM | POA: Diagnosis not present

## 2016-11-18 DIAGNOSIS — Z4789 Encounter for other orthopedic aftercare: Secondary | ICD-10-CM | POA: Diagnosis not present

## 2016-11-19 ENCOUNTER — Telehealth: Payer: Self-pay | Admitting: Internal Medicine

## 2016-11-19 DIAGNOSIS — I130 Hypertensive heart and chronic kidney disease with heart failure and stage 1 through stage 4 chronic kidney disease, or unspecified chronic kidney disease: Secondary | ICD-10-CM | POA: Diagnosis not present

## 2016-11-19 DIAGNOSIS — E1140 Type 2 diabetes mellitus with diabetic neuropathy, unspecified: Secondary | ICD-10-CM | POA: Diagnosis not present

## 2016-11-19 DIAGNOSIS — M4802 Spinal stenosis, cervical region: Secondary | ICD-10-CM | POA: Diagnosis not present

## 2016-11-19 DIAGNOSIS — E1151 Type 2 diabetes mellitus with diabetic peripheral angiopathy without gangrene: Secondary | ICD-10-CM | POA: Diagnosis not present

## 2016-11-19 DIAGNOSIS — Z4789 Encounter for other orthopedic aftercare: Secondary | ICD-10-CM | POA: Diagnosis not present

## 2016-11-19 DIAGNOSIS — I5032 Chronic diastolic (congestive) heart failure: Secondary | ICD-10-CM | POA: Diagnosis not present

## 2016-11-19 NOTE — Telephone Encounter (Signed)
[facility] called and stated that they saw the patient today for a swallow test after cervical fusion and is looking for speech therapy for 2x month for 1 month. Please advise, thank you!  Call @ [phone]

## 2016-11-19 NOTE — Telephone Encounter (Signed)
Verbal okay was given. 

## 2016-11-20 DIAGNOSIS — M4802 Spinal stenosis, cervical region: Secondary | ICD-10-CM | POA: Diagnosis not present

## 2016-11-20 DIAGNOSIS — I5032 Chronic diastolic (congestive) heart failure: Secondary | ICD-10-CM | POA: Diagnosis not present

## 2016-11-20 DIAGNOSIS — I130 Hypertensive heart and chronic kidney disease with heart failure and stage 1 through stage 4 chronic kidney disease, or unspecified chronic kidney disease: Secondary | ICD-10-CM | POA: Diagnosis not present

## 2016-11-20 DIAGNOSIS — E1140 Type 2 diabetes mellitus with diabetic neuropathy, unspecified: Secondary | ICD-10-CM | POA: Diagnosis not present

## 2016-11-20 DIAGNOSIS — E1151 Type 2 diabetes mellitus with diabetic peripheral angiopathy without gangrene: Secondary | ICD-10-CM | POA: Diagnosis not present

## 2016-11-20 DIAGNOSIS — Z4789 Encounter for other orthopedic aftercare: Secondary | ICD-10-CM | POA: Diagnosis not present

## 2016-11-20 NOTE — Telephone Encounter (Signed)
thanks

## 2016-11-24 ENCOUNTER — Telehealth: Payer: Self-pay | Admitting: *Deleted

## 2016-11-24 ENCOUNTER — Ambulatory Visit: Payer: Medicare Other | Admitting: Cardiovascular Disease

## 2016-11-24 DIAGNOSIS — I130 Hypertensive heart and chronic kidney disease with heart failure and stage 1 through stage 4 chronic kidney disease, or unspecified chronic kidney disease: Secondary | ICD-10-CM | POA: Diagnosis not present

## 2016-11-24 DIAGNOSIS — E1151 Type 2 diabetes mellitus with diabetic peripheral angiopathy without gangrene: Secondary | ICD-10-CM | POA: Diagnosis not present

## 2016-11-24 DIAGNOSIS — M4802 Spinal stenosis, cervical region: Secondary | ICD-10-CM | POA: Diagnosis not present

## 2016-11-24 DIAGNOSIS — Z4789 Encounter for other orthopedic aftercare: Secondary | ICD-10-CM | POA: Diagnosis not present

## 2016-11-24 DIAGNOSIS — I5032 Chronic diastolic (congestive) heart failure: Secondary | ICD-10-CM | POA: Diagnosis not present

## 2016-11-24 DIAGNOSIS — E1140 Type 2 diabetes mellitus with diabetic neuropathy, unspecified: Secondary | ICD-10-CM | POA: Diagnosis not present

## 2016-11-24 NOTE — Telephone Encounter (Signed)
-----   Message from Blain Pais sent at 11/24/2016 11:45 AM EDT ----- Regarding: tcm/ph 11/24/2016 9:40 Dr. Rockey Situ

## 2016-11-24 NOTE — Telephone Encounter (Signed)
Patient contacted regarding discharge from [facility] on 11/15/16.   Patient understands to follow up with provider ? On 11/27/16 at 0830am at [facility] with Ignacia Bayley, NP  Patient understands discharge instructions? Yes Patient understands medications and regiment? Yes  Patient understands to bring all medications to this visit? Yes

## 2016-11-25 ENCOUNTER — Telehealth: Payer: Self-pay | Admitting: Internal Medicine

## 2016-11-25 DIAGNOSIS — Z4789 Encounter for other orthopedic aftercare: Secondary | ICD-10-CM | POA: Diagnosis not present

## 2016-11-25 DIAGNOSIS — M4802 Spinal stenosis, cervical region: Secondary | ICD-10-CM | POA: Diagnosis not present

## 2016-11-25 DIAGNOSIS — E1140 Type 2 diabetes mellitus with diabetic neuropathy, unspecified: Secondary | ICD-10-CM | POA: Diagnosis not present

## 2016-11-25 DIAGNOSIS — I5032 Chronic diastolic (congestive) heart failure: Secondary | ICD-10-CM | POA: Diagnosis not present

## 2016-11-25 DIAGNOSIS — I130 Hypertensive heart and chronic kidney disease with heart failure and stage 1 through stage 4 chronic kidney disease, or unspecified chronic kidney disease: Secondary | ICD-10-CM | POA: Diagnosis not present

## 2016-11-25 DIAGNOSIS — E1151 Type 2 diabetes mellitus with diabetic peripheral angiopathy without gangrene: Secondary | ICD-10-CM | POA: Diagnosis not present

## 2016-11-25 NOTE — Telephone Encounter (Signed)
refaxed number provided below.

## 2016-11-25 NOTE — Telephone Encounter (Signed)
Cathy from [facility] called and state that she faxed over and order for a barium swallow. She needed to correct the fax number that is on the papers. She needs it faxed to [phone]

## 2016-11-26 ENCOUNTER — Other Ambulatory Visit: Payer: Self-pay | Admitting: Internal Medicine

## 2016-11-26 ENCOUNTER — Telehealth: Payer: Self-pay | Admitting: Internal Medicine

## 2016-11-26 DIAGNOSIS — I5032 Chronic diastolic (congestive) heart failure: Secondary | ICD-10-CM | POA: Diagnosis not present

## 2016-11-26 DIAGNOSIS — E1151 Type 2 diabetes mellitus with diabetic peripheral angiopathy without gangrene: Secondary | ICD-10-CM | POA: Diagnosis not present

## 2016-11-26 DIAGNOSIS — M4802 Spinal stenosis, cervical region: Secondary | ICD-10-CM | POA: Diagnosis not present

## 2016-11-26 DIAGNOSIS — I130 Hypertensive heart and chronic kidney disease with heart failure and stage 1 through stage 4 chronic kidney disease, or unspecified chronic kidney disease: Secondary | ICD-10-CM | POA: Diagnosis not present

## 2016-11-26 DIAGNOSIS — E1140 Type 2 diabetes mellitus with diabetic neuropathy, unspecified: Secondary | ICD-10-CM | POA: Diagnosis not present

## 2016-11-26 DIAGNOSIS — R296 Repeated falls: Secondary | ICD-10-CM

## 2016-11-26 DIAGNOSIS — Z4789 Encounter for other orthopedic aftercare: Secondary | ICD-10-CM | POA: Diagnosis not present

## 2016-11-26 NOTE — Telephone Encounter (Signed)
DME order written,  If she still has home health agency involved,  They should be able to get that for her

## 2016-11-26 NOTE — Telephone Encounter (Signed)
Pt called and stated that her spouse went to go put in the slide in bath chair in the tub for her but it is too big. Pt would like to know if Dr. Derrel Nip could write a script for a regular bath chair. Please advise, thank you!  Call pt @ [phone]

## 2016-11-26 NOTE — Telephone Encounter (Signed)
Please advise 

## 2016-11-27 ENCOUNTER — Ambulatory Visit (INDEPENDENT_AMBULATORY_CARE_PROVIDER_SITE_OTHER): Payer: Medicare Other | Admitting: Nurse Practitioner

## 2016-11-27 ENCOUNTER — Telehealth: Payer: Self-pay | Admitting: Internal Medicine

## 2016-11-27 ENCOUNTER — Encounter: Payer: Self-pay | Admitting: Nurse Practitioner

## 2016-11-27 VITALS — BP 124/58 | HR 67 | Ht 67.0 in | Wt 187.0 lb

## 2016-11-27 DIAGNOSIS — N183 Chronic kidney disease, stage 3 unspecified: Secondary | ICD-10-CM

## 2016-11-27 DIAGNOSIS — I10 Essential (primary) hypertension: Secondary | ICD-10-CM | POA: Diagnosis not present

## 2016-11-27 DIAGNOSIS — Z794 Long term (current) use of insulin: Secondary | ICD-10-CM

## 2016-11-27 DIAGNOSIS — E1140 Type 2 diabetes mellitus with diabetic neuropathy, unspecified: Secondary | ICD-10-CM | POA: Diagnosis not present

## 2016-11-27 DIAGNOSIS — I2510 Atherosclerotic heart disease of native coronary artery without angina pectoris: Secondary | ICD-10-CM

## 2016-11-27 DIAGNOSIS — E785 Hyperlipidemia, unspecified: Secondary | ICD-10-CM | POA: Diagnosis not present

## 2016-11-27 DIAGNOSIS — I5032 Chronic diastolic (congestive) heart failure: Secondary | ICD-10-CM | POA: Diagnosis not present

## 2016-11-27 DIAGNOSIS — I130 Hypertensive heart and chronic kidney disease with heart failure and stage 1 through stage 4 chronic kidney disease, or unspecified chronic kidney disease: Secondary | ICD-10-CM | POA: Diagnosis not present

## 2016-11-27 DIAGNOSIS — E1151 Type 2 diabetes mellitus with diabetic peripheral angiopathy without gangrene: Secondary | ICD-10-CM | POA: Diagnosis not present

## 2016-11-27 DIAGNOSIS — I255 Ischemic cardiomyopathy: Secondary | ICD-10-CM

## 2016-11-27 DIAGNOSIS — E119 Type 2 diabetes mellitus without complications: Secondary | ICD-10-CM | POA: Diagnosis not present

## 2016-11-27 DIAGNOSIS — M4802 Spinal stenosis, cervical region: Secondary | ICD-10-CM | POA: Diagnosis not present

## 2016-11-27 DIAGNOSIS — Z4789 Encounter for other orthopedic aftercare: Secondary | ICD-10-CM | POA: Diagnosis not present

## 2016-11-27 NOTE — Telephone Encounter (Signed)
Is it ok to give verbal orders for swallow therapy 1 time a month for 2 months?

## 2016-11-27 NOTE — Telephone Encounter (Signed)
Pamela Collier called back again. She states that you may leave a message as her voicemail in secure/confiedntial.

## 2016-11-27 NOTE — Telephone Encounter (Signed)
[name] called back returning your call. Please advise, thank you!  Call [facility] @ [phone]

## 2016-11-27 NOTE — Telephone Encounter (Signed)
Pamela Collier [phone] called from [facility] regarding a written order was faxed for modified barium swallow 2 days ago. The correct number to fax the order back to is [phone] and also need a verbal order to see pt for swallow therapy 1 time a month for 2 months. VM can be left on the verbal order. Thank you!

## 2016-11-27 NOTE — Telephone Encounter (Signed)
yes

## 2016-11-27 NOTE — Telephone Encounter (Signed)
Pt came into office asking about chair. Would you please advise pt how she will receive her chair for the bath tub. Phone number is [phone].

## 2016-11-27 NOTE — Telephone Encounter (Signed)
Left message as directed below to let [name] know that Dr. Derrel Nip is okay with the 1 swallow therapy for two months.

## 2016-11-27 NOTE — Progress Notes (Signed)
Office Visit    Patient [name] Date of Encounter: 11/27/2016  Primary Care Provider:  [name], MD Primary Cardiologist:  Johnny Bridge, MD   Chief Complaint    76 year old female with prior history of CAD, hypertension, hyperlipidemia, diabetes, stage III chronic kidney disease, pulmonary sarcoidosis, peripheral vascular disease, depression, sleep apnea, and prior stroke, who presents for follow-up after recent hospitalization for chest pain.  Past Medical History    Past Medical History:  Diagnosis Date  . Acute respiratory failure ([employer]) 11/21/2014  . Anxiety   . Arthritis 08/05/2013  . Atherosclerotic peripheral vascular disease with gangrene [facility]) august 2012  . Cardiomyopathy, ischemic    a. EF 35 to 40% by echo in 2013 b. EF improved to 50-55% by echo in 04/2015; 11/2016 Echo: EF 55-60%, Gr1 DD, mildly dil LA, nl RV fxn.  . Cerebral infarct ([place]) 08/17/2013  . Chronic diastolic CHF (congestive heart failure) (HCC)    a. EF 50-55% by echo in 04/2015; b. 11/2016 Echo: EF 55-60%, Gr1 DD.  [name] COPD (chronic obstructive pulmonary disease) ([place])   . Coronary artery disease, occlusive    a. Previous PCI to the LAD, LCx, and RCA in 2010, 2011, 2013, and 2016. All performed in [place]; b. 11/2016 Lexiscan MV: EF 43%, no ischemia->low risk.  . 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 Diabetic diarrhea ([place]) 10/03/2014  . DM type 2, uncontrolled, with renal complications ([place]) 0/0/9233  . Hepatic steatosis    by CT abd pelvis  . History of kidney stones    at a younger age  . Hyperlipidemia LDL goal <100 02/23/2014  . Hypertensive heart disease   . Osteoporosis, post-menopausal   . 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 Pleural effusion 10/25/2012   10/2012 CT chest >> small to moderate R lung effusion>> chylothorax, 100% lymphs 10/2013 thoracentesis> cytology negative, WBC 1471, > 90% "small  lymphs" 01/2014 CT chest> near complete resolution of pleural effusion, stable lymphadenopathy 08/2014 CT chest [place] ([facility]): small right sided effusion decreased in size, stable mediastinal lymphadenopathy 1.0cm largest, 61m  . 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 Sleep apnea   . Stage III chronic kidney disease    Past Surgical History:  Procedure Laterality Date  . ABDOMINAL HYSTERECTOMY     at ge 39. secondary to bleeding/partial  . ANTERIOR CERVICAL DECOMP/DISCECTOMY FUSION N/A 10/10/2016   Procedure: Anterior Cervical Discectomy Fusion - Cervical four4- five - Cervical five-Cervical six - Cervical six-Cervical seven;  Surgeon: Earnie Larsson, MD;  Location: [facility];  Service: Neurosurgery;  Laterality: N/A;  . BREAST CYST ASPIRATION Right   . CARPAL TUNNEL RELEASE Bilateral   . CHOLECYSTECTOMY     in [place]   . COLONOSCOPY WITH PROPOFOL N/A 01/09/2015   Procedure: COLONOSCOPY WITH PROPOFOL;  Surgeon: Lucilla Lame, MD;  Location: [facility] ENDOSCOPY;  Service: Endoscopy;  Laterality: N/A;  . CORONARY ANGIOPLASTY WITH STENT PLACEMENT     [place]; [facility]  . EYE SURGERY     bil cataracts  . HERNIA REPAIR     umbilical/ Dr  Pat Patrick  . PTCA  August 2012   Right Posterior tibial artery , [place]  . TOE AMPUTATION  Sept 2012   Right 2nd toe, [place]    Allergies  Allergies  Allergen Reactions  . Cephalexin Itching  . Contrast Media [Iodinated Diagnostic Agents] Itching  . Doxycycline Itching  . Gabapentin Itching  . Sulfa Antibiotics Itching    History of Present Illness    76 year old female with the above complex past medical history including CAD, hypertension, hyperlipidemia, diabetes, stage III chronic  kidney disease, pulmonary sarcoidosis, peripheral vascular disease, depression, sleep apnea, and prior stroke. She is status post multiple percutaneous interventions to the LAD, left circumflex, and RCA, all being performed in [place] between 2010 and 2016. In June of this year, she underwent three-level anterior cervical decompression and fusion for cervical stenosis. Since then, she's been receiving physical therapy at home and slowly but steadily regaining strength. 2 weeks ago, while working with physical therapy, she had chest tightness. She presented to the emergency department and seen by our team. She had very mild troponin elevation of 0.03. Stress testing was undertaken and was low risk. Echo showed normal LV function. Since that hospital physician, she has not had any further chest tightness. She continues to work with physical therapy and notes that with slightly higher levels of activity, she does have dyspnea on exertion, though this is stable. She has not had any palpitations, PND, orthopnea, dizziness, syncope, edema, or early satiety.  Home Medications    Prior to Admission medications   Medication Sig Start Date End Date Taking? Authorizing Provider  acetaminophen (TYLENOL) 500 MG tablet Take 1,000 mg by mouth every 6 (six) hours as needed for mild pain or headache.     [provider]  alendronate (FOSAMAX) 70 MG tablet Take 1 tablet (70 mg total) by mouth once a week. Pt takes on Sunday.   Take with a full glass of water on an empty stomach. 07/01/16   [name], MD  ALPRAZolam [name]) 0.25 MG tablet Take 1 tablet (0.25 mg total) by mouth daily. 11/10/16   [name], MD  aspirin EC 81 MG tablet Take 81 mg by mouth daily.    [provider]  BRILINTA 90 MG TABS tablet TAKE 1 TABLET TWICE A DAY 06/30/16   [name], MD  budesonide-formoterol [facility]) 160-4.5 MCG/ACT inhaler Inhale 2 puffs into the lungs 2 (two) times daily. 02/23/15   Juanito Doom, MD  diclofenac sodium (VOLTAREN) 1 % GEL Apply 2 g topically 4 (four) times daily.    [provider]  docusate sodium (COLACE) 100 MG capsule Take 100 mg by mouth 2 (two) times daily as needed for mild constipation. Reported on 11/09/2015    [provider]  fluticasone (FLONASE) 50 MCG/ACT nasal spray Place 2 sprays into both nostrils daily as needed for rhinitis.    [provider]  furosemide (LASIX) 20 MG tablet Take 1 tablet (20 mg total) by mouth daily. Patient taking differently: Take 20 mg by mouth every other day.  06/11/16   [name], MD  glucose blood (ACCU-CHEK AVIVA PLUS) test strip USE TO CHECK GLUCOSE  THREE TIMES DAILY 04/29/16   [name], MD  HYDROcodone-acetaminophen (NORCO/VICODIN) 5-325 MG tablet Take 1-2 tablets by mouth every 4 (four) hours as needed (breakthrough pain). 10/13/16   Earnie Larsson, MD  Insulin Lispro Prot & Lispro (HUMALOG MIX 75/25 KWIKPEN) (75-25) 100 UNIT/ML Kwikpen Inject  45 to 60 units twice daily before morning and evening meals Patient taking differently: Inject 44 units in the morning and 60 units in the evening 11/27/15   [name], MD  isosorbide mononitrate (IMDUR) 30 MG 24 hr tablet TAKE 1 TABLET DAILY 07/16/16   [name], MD  losartan (COZAAR) 100 MG tablet TAKE 1 TABLET DAILY 09/11/16   [name], MD  LYRICA 100 MG capsule TAKE ONE CAPSULE BY MOUTH THREE TIMES DAILY 11/27/16   [name], MD  metoprolol succinate (TOPROL-XL) 25 MG 24 hr tablet Take 1 tablet (25 mg total) by mouth daily. 06/18/16   [name], MD  nitroGLYCERIN (NITROSTAT) 0.4 MG SL tablet Place 1 tablet (0.4 mg total) under the tongue every 5 (five) minutes as needed for chest pain. 04/18/15   Minna Merritts, MD  ondansetron (ZOFRAN) 4 MG tablet Take 1 tablet (4 mg total) by mouth every 8 (eight) hours as needed for nausea or vomiting. 05/18/16   Rudene Re, MD  PARoxetine (PAXIL) 20 MG tablet TAKE ONE TABLET  BY MOUTH ONCE DAILY 06/25/16   [name], MD  polyethylene glycol (MIRALAX / GLYCOLAX) packet Take 17 g by mouth daily as needed for mild constipation. Reported on 10/25/2015    [provider]  rosuvastatin (CRESTOR) 20 MG tablet Take 20 mg by mouth daily.     [provider]    Review of Systems    As above, she recently had an episode of chest tightness requiring admission. She has since been doing well. She has some degree of chronic stable dyspnea and continues to work with physical therapy. She denies palpitations, PND, orthopnea, dizziness, syncope, edema, or early satiety.  All other systems reviewed and are otherwise negative except as noted above.  Physical Exam    VS:  BP (!) 124/58 (BP Location: Right Arm, Patient Position: Sitting, Cuff Size: Normal)   Pulse 67   Ht 5\' 7"  (1.702 m)   Wt 187 lb (84.8 kg)   BMI 29.29 kg/m  , BMI Body mass index is 29.29 kg/m. GEN: Well nourished, well developed, in no acute distress.  HEENT: normal.  Neck: Supple, no JVD, carotid bruits, or masses. Cardiac: RRR, 1/6 systolic ejection murmur at the left upper sternal border, no rubs, or gallops. No clubbing, cyanosis, edema.  Radials/DP/PT 2+ and equal bilaterally.  Respiratory:  Respirations regular and unlabored, clear to auscultation bilaterally. GI: Soft, nontender, nondistended, BS + x 4. MS: no deformity or atrophy. Skin: warm and dry, no rash. Neuro:  Strength and sensation are intact. Psych: Normal affect.  Accessory Clinical Findings    ECG - Regular sinus rhythm, 67, first-degree AV block, left axis deviation, right bundle branch block, LVH with repolarization abnormality, no acute changes.  Assessment & Plan    1.  Coronary artery disease: Status post multiple percutaneous interventions in [place] between 2010 and 2016. She was recently admitted with chest pain and mild troponin elevation. Stress testing was performed and was low risk. EF was normal by  echo. She has not had any recurrent chest tightness. She remains on aspirin, beta blocker, ARB, nitrate, brilinta, and statin therapy.  2. Chronic diastolic congestive heart failure: Heart rate and blood pressure stable today. Weight has been stable at home and she is euvolemic on exam. Continue current dose of Lasix.   3. Essential hypertension: Blood pressure stable today.  4. Hyperlipidemia: LDL was 60 on July 7. Normal LFTs  in February.  5. Stage III chronic kidney disease: Creatinine was stable throughout recent hospitalization.  6. Type 2 diabetes mellitus: She remains on insulin and this is managed by primary care. A1c was 7.5 in May.  7. Disposition: Follow-up with Dr. Rockey Situ in 3 months or sooner if necessary.   Murray Hodgkins, NP 11/27/2016, 8:51 AM

## 2016-11-27 NOTE — Telephone Encounter (Signed)
Order has been faxed to [facility] at [facility].

## 2016-11-27 NOTE — Patient Instructions (Signed)
Medication Instructions: - Your physician recommends that you continue on your current medications as directed. Please refer to the Current Medication list given to you today.  Labwork: - none ordered  Procedures/Testing: - none ordered  Follow-Up: - Your physician recommends that you schedule a follow-up appointment in: 3 months with Dr. Gollan.    Any Additional Special Instructions Will Be Listed Below (If Applicable).     If you need a refill on your cardiac medications before your next appointment, please call your pharmacy.   

## 2016-11-28 ENCOUNTER — Other Ambulatory Visit: Payer: Self-pay | Admitting: Internal Medicine

## 2016-11-28 DIAGNOSIS — R1319 Other dysphagia: Secondary | ICD-10-CM

## 2016-11-28 DIAGNOSIS — R1310 Dysphagia, unspecified: Secondary | ICD-10-CM

## 2016-12-02 ENCOUNTER — Other Ambulatory Visit: Payer: Self-pay | Admitting: Internal Medicine

## 2016-12-02 DIAGNOSIS — I5032 Chronic diastolic (congestive) heart failure: Secondary | ICD-10-CM | POA: Diagnosis not present

## 2016-12-02 DIAGNOSIS — Z4789 Encounter for other orthopedic aftercare: Secondary | ICD-10-CM | POA: Diagnosis not present

## 2016-12-02 DIAGNOSIS — R1310 Dysphagia, unspecified: Secondary | ICD-10-CM

## 2016-12-02 DIAGNOSIS — E1140 Type 2 diabetes mellitus with diabetic neuropathy, unspecified: Secondary | ICD-10-CM | POA: Diagnosis not present

## 2016-12-02 DIAGNOSIS — E1151 Type 2 diabetes mellitus with diabetic peripheral angiopathy without gangrene: Secondary | ICD-10-CM | POA: Diagnosis not present

## 2016-12-02 DIAGNOSIS — M4802 Spinal stenosis, cervical region: Secondary | ICD-10-CM | POA: Diagnosis not present

## 2016-12-02 DIAGNOSIS — I130 Hypertensive heart and chronic kidney disease with heart failure and stage 1 through stage 4 chronic kidney disease, or unspecified chronic kidney disease: Secondary | ICD-10-CM | POA: Diagnosis not present

## 2016-12-04 DIAGNOSIS — E1151 Type 2 diabetes mellitus with diabetic peripheral angiopathy without gangrene: Secondary | ICD-10-CM | POA: Diagnosis not present

## 2016-12-04 DIAGNOSIS — I5032 Chronic diastolic (congestive) heart failure: Secondary | ICD-10-CM | POA: Diagnosis not present

## 2016-12-04 DIAGNOSIS — M4802 Spinal stenosis, cervical region: Secondary | ICD-10-CM | POA: Diagnosis not present

## 2016-12-04 DIAGNOSIS — E1140 Type 2 diabetes mellitus with diabetic neuropathy, unspecified: Secondary | ICD-10-CM | POA: Diagnosis not present

## 2016-12-04 DIAGNOSIS — I130 Hypertensive heart and chronic kidney disease with heart failure and stage 1 through stage 4 chronic kidney disease, or unspecified chronic kidney disease: Secondary | ICD-10-CM | POA: Diagnosis not present

## 2016-12-04 DIAGNOSIS — Z4789 Encounter for other orthopedic aftercare: Secondary | ICD-10-CM | POA: Diagnosis not present

## 2016-12-05 ENCOUNTER — Telehealth: Payer: Self-pay | Admitting: Internal Medicine

## 2016-12-05 DIAGNOSIS — E1151 Type 2 diabetes mellitus with diabetic peripheral angiopathy without gangrene: Secondary | ICD-10-CM | POA: Diagnosis not present

## 2016-12-05 DIAGNOSIS — I5032 Chronic diastolic (congestive) heart failure: Secondary | ICD-10-CM | POA: Diagnosis not present

## 2016-12-05 DIAGNOSIS — I130 Hypertensive heart and chronic kidney disease with heart failure and stage 1 through stage 4 chronic kidney disease, or unspecified chronic kidney disease: Secondary | ICD-10-CM | POA: Diagnosis not present

## 2016-12-05 DIAGNOSIS — Z4789 Encounter for other orthopedic aftercare: Secondary | ICD-10-CM | POA: Diagnosis not present

## 2016-12-05 DIAGNOSIS — E1140 Type 2 diabetes mellitus with diabetic neuropathy, unspecified: Secondary | ICD-10-CM | POA: Diagnosis not present

## 2016-12-05 DIAGNOSIS — M4802 Spinal stenosis, cervical region: Secondary | ICD-10-CM | POA: Diagnosis not present

## 2016-12-05 NOTE — Telephone Encounter (Signed)
LMTCB  Per last office visit nothing mentioned about any changes or orders to cpap machine.  Office Visit   09/17/2016 [facility]  [name], MD  Pulmonary Disease   Pulmonary sarcoidosis ([place]) +2 more  Dx   Follow-up ; Referred by [name], MD  Reason for Visit   Additional Documentation   Vitals:   BP 130/62 (BP Location: Left Arm, Patient Position: Sitting, Cuff Size: Normal)   Pulse 83   Resp 16   Ht 5\' 7"  (1.702 m)   Wt 183 lb (83 kg)   SpO2 100%   BMI 28.66 kg/m   BSA 1.98 m      More Vitals   Flowsheets:   Extended Vitals,   MEWS Score,   Custom Formula Data,   Anthropometrics,   Vital Signs     Encounter Info:   Billing Info,   History,   Allergies,   Detailed Report     All Notes   Progress Notes by [name], MD at 09/17/2016 1:30 PM   Author: [name], MD Author Type: Physician Filed: 09/17/2016 1:57 PM  Note Status: Signed Cosign: Cosign Not Required Encounter Date: 09/17/2016  Editor: [name], MD (Physician)  Expand All Collapse All   [facility] Medicine     Assessment and Plan:  The patient is a 76 year old female with dyspnea, and weakness with debility, deconditioning, sleep apnea, and sarcoidosis.  Pre-op pulmonary evaluation/examination.  --Pt is at moderate risks of pulmonary complications with surgery. These were explained today, in order to minimize these she should delay the surgery if she is having a chest cold.  She should use her cpap after the surgery.   Obstructive sleep apnea. --Continue using cpap every night.    Elevated right diaphragm.  -The patient has decreased air entry in the right lung base, review of her chest x-ray shows an elevated right diaphragm, possibly with a diaphragmatic paralysis. This may be related to her previous history of chylous pleural effusion. -Explained to her that there is no particular treatment for this,  and it is likely a chronic condition that has been there for some time.  Chronic debility and deconditioning.  --I suspect that this is the greatest contributor to her exertional dyspnea.  --Continue to increase activity as tolerated.   Chronic bronchitis.  --Continue using symbicort bid.    Pulmonary sarcoidosis. --Currently appears stable.   Chylous pleural effusion. -Recurrent, due to sarcoidosis, currently stable.   Date: 09/15/2016  MRN# [id number] EMONIE ESPERICUETA [birth year]   [name] is a 76 y.o. old female seen in follow up for chief complaint of      Chief Complaint  Patient presents with  . Follow-up    pt needs surgery clearance form completed from [facility].     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 She has a history of sleep apnea, and has been maintained on CPAP. At her last visit it was noted that she had significant deconditioning and she was encouraged to continue PT. It was also noted that she was not using PAP very regularly.  She is now using Symbicort inhaler 2 puff bid. She is rinsing her mouth after use. She is also using her CPAP every night,  she got a cleaning device to use with it.  Today she notes that she is going for a cervical fusion, as she was having a lot of pain.   Review of CXR from 01/08/16; elevated right diaphragm.    chest x-ray 09/12/15, bibasilar atelectasis, minimal pleural effusions, scattered interstitial scarring.  10/2012 CT chest >> small to moderate R lung effusion>> chylothorax, 100% lymphs 10/2013 thoracentesis> cytology negative, WBC 1471, > 90% "small lymphs" 01/2014 CT chest> near complete resolution of pleural effusion, stable lymphadenopathy 08/2014 CT chest [place] ([facility]): small right sided effusion decreased in size, stable mediastinal lymphadenopathy 1.0cm largest, 73m  **Desat walk 09/17/16; at rest on RA her sat was 98% and HR 74. After 180 feet sat was 96% and HR 84, slow pace, minimal dyspnea.  After 360 feet; sat was 95% and HR 92; moderate dyspnea.   Medication:       Outpatient Encounter Prescriptions as of 09/17/2016  Medication Sig  . acetaminophen (TYLENOL) 500 MG tablet Take 500 mg by mouth every 6 (six) hours as needed for mild pain or headache.  . albuterol (PROAIR HFA) 108 (90 Base) MCG/ACT inhaler Inhale 2 puffs into the lungs every 6 (six) hours as needed for wheezing or shortness of breath.  [name] albuterol (PROVENTIL) (2.5 MG/3ML) 0.083% nebulizer solution Take 3 mLs (2.5 mg total) by nebulization every 6 (six) hours as needed for wheezing or shortness of breath.  [name] alendronate (FOSAMAX) 70 MG tablet Take 1 tablet (70 mg total) by mouth once a week. Pt takes on Sunday.   Take with a full glass of water on an empty stomach.  [name] aspirin EC 81 MG tablet Take 81 mg by mouth daily.  . B-D UF III MINI PEN NEEDLES 31G X 5 MM MISC USE THREE TIMES A DAY  . benzonatate (TESSALON) 100 MG capsule Take 200 mg by mouth 3 (three) times daily as needed for cough. Reported on 11/09/2015  . BRILINTA 90 MG TABS tablet TAKE 1 TABLET TWICE A DAY  . budesonide-formoterol (SYMBICORT) 160-4.5 MCG/ACT inhaler Inhale 2 puffs into the lungs 2 (two) times daily.  . diazepam (VALIUM) 5 MG tablet One tablet one hour before the MRI.  May repeat once if needed  . docusate sodium (COLACE) 100 MG capsule Take 100 mg by mouth 2 (two) times daily as needed for mild constipation. Reported on 11/09/2015  . fluticasone (FLONASE) 50 MCG/ACT nasal spray Place 2 sprays into both nostrils daily as needed for rhinitis.  . furosemide (LASIX) 20 MG tablet Take 1 tablet (20 mg total) by mouth daily.  [name] glucose blood (ACCU-CHEK AVIVA PLUS) test strip USE TO CHECK  GLUCOSE  THREE TIMES DAILY  . hydrOXYzine (ATARAX/VISTARIL) 10 MG tablet Take 1 tablet (10 mg total) by mouth 3 (three) times daily as needed.  . Insulin Lispro Prot & Lispro (HUMALOG MIX 75/25 KWIKPEN) (75-25) 100 UNIT/ML Kwikpen Inject 45 to 60 units twice daily before morning and evening meals  . isosorbide mononitrate (IMDUR) 30 MG 24 hr tablet TAKE 1 TABLET DAILY  . losartan (COZAAR) 100 MG tablet TAKE 1 TABLET DAILY  . metoprolol succinate (TOPROL-XL) 25 MG 24 hr tablet Take 1 tablet (25 mg total) by mouth daily.  . nitroGLYCERIN (NITROSTAT) 0.4 MG SL tablet Place 1 tablet (0.4 mg total) under the tongue every 5 (five) minutes as needed for chest pain.  [name] ondansetron (ZOFRAN) 4 MG tablet Take 1 tablet (4 mg total) by mouth  every 8 (eight) hours as needed for nausea or vomiting.  [name] PARoxetine (PAXIL) 20 MG tablet TAKE ONE TABLET BY MOUTH ONCE DAILY  . polyethylene glycol (MIRALAX / GLYCOLAX) packet Take 17 g by mouth daily as needed for mild constipation. Reported on 10/25/2015  . pregabalin (LYRICA) 100 MG capsule Take 1 capsule (100 mg total) by mouth 3 (three) times daily.  . rosuvastatin (CRESTOR) 20 MG tablet Take 20 mg by mouth at bedtime.  . traMADol (ULTRAM) 50 MG tablet Take 50 mg by mouth every 6 (six) hours as needed for moderate pain.  . Vitamin D, Ergocalciferol, (DRISDOL) 50000 units CAPS capsule Take 50,000 Units by mouth every 7 (seven) days. Pt takes on Sunday.      Facility-Administered Encounter Medications as of 09/17/2016  Medication  . albuterol (PROVENTIL) (5 MG/ML) 0.5% nebulizer solution 2.5 mg     Allergies:  Cephalexin; Contrast media [iodinated diagnostic agents]; Doxycycline; Gabapentin; and Sulfa antibiotics  Review of Systems: Gen:  Denies  fever, sweats. HEENT: Denies blurred vision. Cvc:  No dizziness, chest pain or heaviness Resp:   Denies cough or sputum porduction. Gi: Denies swallowing difficulty, stomach pain. constipation, bowel  incontinence Gu:  Denies bladder incontinence, burning urine Ext:   No Joint pain, stiffness. Skin: No skin rash, easy bruising. Endoc:  No polyuria, polydipsia. Psych: No depression, insomnia. Other:  All other systems were reviewed and found to be negative other than what is mentioned in the HPI.   Physical Examination:   VS: There were no vitals taken for this visit.  General Appearance: No distress  Neuro:without focal findings,  speech normal,  HEENT: PERRLA, EOM intact. Pulmonary: normal breath sounds, No wheezing. Decreased air entry in right lower lobe.    CardiovascularNormal S1,S2.  No m/r/g.   Abdomen: Benign, Soft, non-tender. Renal:  No costovertebral tenderness  GU:  Not performed at this time. Endoc: No evident thyromegaly, no signs of acromegaly. Skin:   warm, no rash. Extremities: normal, no cyanosis, clubbing.   LABORATORY PANEL:   CBC Last[facility]  No results for input(s): WBC, HGB, HCT, PLT in the last 168 hours.   ------------------------------------------------------------------------------------------------------------------  Chemistries   LastLabs  No results for input(s): NA, K, CL, CO2, GLUCOSE, BUN, CREATININE, CALCIUM, MG, AST, ALT, ALKPHOS, BILITOT in the last 168 hours.  Invalid input(s): GFRCGP   ------------------------------------------------------------------------------------------------------------------  Cardiac Enzymes [facility]  No results for input(s): TROPONINI in the last 168 hours.   ------------------------------------------------------------  RADIOLOGY:   No results found for this or any previous visit.     Results for orders placed during the hospital encounter of 09/12/15  DG Chest 2 View   Narrative CLINICAL DATA:  Shortness of breath and chest pain. History of sarcoidosis  EXAM: CHEST  2 VIEW  COMPARISON:  Chest radiograph June 28, 2015 and chest CT July 11, 2015  FINDINGS: There is no  edema or consolidation. Heart size and pulmonary vascularity are normal. No adenopathy. Calcified left mediastinal lymph nodes likely is secondary to sarcoidosis. No bone lesions. No pneumothorax.  IMPRESSION: Calcified lymph nodes, a finding likely secondary to prior sarcoidosis. No edema or consolidation.   Electronically Signed   By: Lowella Grip III M.D.   On: 09/12/2015 12:46    ------------------------------------------------------------------------------------------------------------------  Thank  you for allowing [facility] to assist in the care of your patient. Our recommendations are noted above.  Please contact us if we can be of further service.   Marda Stalker, MD.  [facility] Office Number: [phone]  Patricia Pesa, M.D.  Merton Border, M.D  09/15/2016      Patient Instructions by [name], MD at 09/17/2016 1:30 PM   Author: [name], MD Author Type: Physician Filed: 09/17/2016 1:52 PM  Note Status: Signed Cosign: Cosign Not Required Encounter Date: 09/17/2016  Editor: [name], MD (Physician)    --Continue using symbicort.  --Use cpap every night.     Instructions      Return in about 6 months (around 03/20/2017).  --Continue using symbicort.  --Use cpap every night.

## 2016-12-05 NOTE — Telephone Encounter (Signed)
Pt calling stating she's been without her sleep machine for about a month now She is stating the company is telling her they are waiting for a response from [place] But she is worried since it has been a month now  Please advise.

## 2016-12-08 ENCOUNTER — Other Ambulatory Visit: Payer: Self-pay | Admitting: Internal Medicine

## 2016-12-08 DIAGNOSIS — Z4789 Encounter for other orthopedic aftercare: Secondary | ICD-10-CM | POA: Diagnosis not present

## 2016-12-08 DIAGNOSIS — I130 Hypertensive heart and chronic kidney disease with heart failure and stage 1 through stage 4 chronic kidney disease, or unspecified chronic kidney disease: Secondary | ICD-10-CM | POA: Diagnosis not present

## 2016-12-08 DIAGNOSIS — G4733 Obstructive sleep apnea (adult) (pediatric): Secondary | ICD-10-CM

## 2016-12-08 DIAGNOSIS — E1140 Type 2 diabetes mellitus with diabetic neuropathy, unspecified: Secondary | ICD-10-CM | POA: Diagnosis not present

## 2016-12-08 DIAGNOSIS — I5032 Chronic diastolic (congestive) heart failure: Secondary | ICD-10-CM | POA: Diagnosis not present

## 2016-12-08 DIAGNOSIS — E1151 Type 2 diabetes mellitus with diabetic peripheral angiopathy without gangrene: Secondary | ICD-10-CM | POA: Diagnosis not present

## 2016-12-08 DIAGNOSIS — M4802 Spinal stenosis, cervical region: Secondary | ICD-10-CM | POA: Diagnosis not present

## 2016-12-08 NOTE — Telephone Encounter (Signed)
Returned call to patient. She needs new cpap supplies. Order has been entered and awaiting MD signature DR. Nothing further needed at this time.

## 2016-12-09 DIAGNOSIS — I5032 Chronic diastolic (congestive) heart failure: Secondary | ICD-10-CM | POA: Diagnosis not present

## 2016-12-09 DIAGNOSIS — E1151 Type 2 diabetes mellitus with diabetic peripheral angiopathy without gangrene: Secondary | ICD-10-CM | POA: Diagnosis not present

## 2016-12-09 DIAGNOSIS — I130 Hypertensive heart and chronic kidney disease with heart failure and stage 1 through stage 4 chronic kidney disease, or unspecified chronic kidney disease: Secondary | ICD-10-CM | POA: Diagnosis not present

## 2016-12-09 DIAGNOSIS — Z4789 Encounter for other orthopedic aftercare: Secondary | ICD-10-CM | POA: Diagnosis not present

## 2016-12-09 DIAGNOSIS — M4802 Spinal stenosis, cervical region: Secondary | ICD-10-CM | POA: Diagnosis not present

## 2016-12-09 DIAGNOSIS — E1140 Type 2 diabetes mellitus with diabetic neuropathy, unspecified: Secondary | ICD-10-CM | POA: Diagnosis not present

## 2016-12-10 ENCOUNTER — Other Ambulatory Visit: Payer: Self-pay | Admitting: Internal Medicine

## 2016-12-10 DIAGNOSIS — I130 Hypertensive heart and chronic kidney disease with heart failure and stage 1 through stage 4 chronic kidney disease, or unspecified chronic kidney disease: Secondary | ICD-10-CM | POA: Diagnosis not present

## 2016-12-10 DIAGNOSIS — E1140 Type 2 diabetes mellitus with diabetic neuropathy, unspecified: Secondary | ICD-10-CM | POA: Diagnosis not present

## 2016-12-10 DIAGNOSIS — E1151 Type 2 diabetes mellitus with diabetic peripheral angiopathy without gangrene: Secondary | ICD-10-CM | POA: Diagnosis not present

## 2016-12-10 DIAGNOSIS — Z4789 Encounter for other orthopedic aftercare: Secondary | ICD-10-CM | POA: Diagnosis not present

## 2016-12-10 DIAGNOSIS — I5032 Chronic diastolic (congestive) heart failure: Secondary | ICD-10-CM | POA: Diagnosis not present

## 2016-12-10 DIAGNOSIS — M4802 Spinal stenosis, cervical region: Secondary | ICD-10-CM | POA: Diagnosis not present

## 2016-12-11 DIAGNOSIS — Z4789 Encounter for other orthopedic aftercare: Secondary | ICD-10-CM | POA: Diagnosis not present

## 2016-12-11 DIAGNOSIS — I130 Hypertensive heart and chronic kidney disease with heart failure and stage 1 through stage 4 chronic kidney disease, or unspecified chronic kidney disease: Secondary | ICD-10-CM | POA: Diagnosis not present

## 2016-12-11 DIAGNOSIS — E1151 Type 2 diabetes mellitus with diabetic peripheral angiopathy without gangrene: Secondary | ICD-10-CM | POA: Diagnosis not present

## 2016-12-11 DIAGNOSIS — I5032 Chronic diastolic (congestive) heart failure: Secondary | ICD-10-CM | POA: Diagnosis not present

## 2016-12-11 DIAGNOSIS — M4802 Spinal stenosis, cervical region: Secondary | ICD-10-CM | POA: Diagnosis not present

## 2016-12-11 DIAGNOSIS — E1140 Type 2 diabetes mellitus with diabetic neuropathy, unspecified: Secondary | ICD-10-CM | POA: Diagnosis not present

## 2016-12-12 ENCOUNTER — Ambulatory Visit
Admission: RE | Admit: 2016-12-12 | Discharge: 2016-12-12 | Disposition: A | Discharge status: Home / Self Care | Payer: Medicare Other | Source: Ambulatory Visit | Attending: Internal Medicine | Admitting: Internal Medicine

## 2016-12-12 DIAGNOSIS — R1312 Dysphagia, oropharyngeal phase: Secondary | ICD-10-CM | POA: Insufficient documentation

## 2016-12-12 DIAGNOSIS — R1310 Dysphagia, unspecified: Secondary | ICD-10-CM | POA: Diagnosis not present

## 2016-12-12 NOTE — Therapy (Signed)
[place], [place], [address] Phone: [phone]   Fax:     Modified Barium Swallow  Patient Details  [name] MRN: [id number] Date of Birth: 10-26-1940 No Data Recorded  Encounter Date: 12/12/2016      End of Session - 12/12/16 1339    Visit Number 1   Number of Visits 1   Date for SLP Re-Evaluation 12/12/16   SLP Start Time 1245   SLP Stop Time  1339   SLP Time Calculation (min) 54 min   Activity Tolerance Patient tolerated treatment well      Past Medical History:  Diagnosis Date  . Acute respiratory failure ([place]) 11/21/2014  . Anxiety   . Arthritis 08/05/2013  . Atherosclerotic peripheral vascular disease with gangrene [facility]) august 2012  . Cardiomyopathy, ischemic    a. EF 35 to 40% by echo in 2013 b. EF improved to 50-55% by echo in 04/2015; 11/2016 Echo: EF 55-60%, Gr1 DD, mildly dil LA, nl RV fxn.  . Cerebral infarct ([place]) 08/17/2013  . Chronic diastolic CHF (congestive heart failure) (HCC)    a. EF 50-55% by echo in 04/2015; b. 11/2016 Echo: EF 55-60%, Gr1 DD.  [name] COPD (chronic obstructive pulmonary disease) ([place])   . Coronary artery disease, occlusive    a. Previous PCI to the LAD, LCx, and RCA in 2010, 2011, 2013, and 2016. All performed in [place]; b. 11/2016 Lexiscan MV: EF 43%, no ischemia->low risk.  . 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 Diabetic diarrhea ([place]) 10/03/2014  . DM type 2, uncontrolled, with renal complications ([place]) 08/12/5954  . Hepatic steatosis    by CT abd pelvis  . History of kidney stones    at a younger age  . Hyperlipidemia LDL goal <100 02/23/2014  . Hypertensive heart disease   . Osteoporosis, post-menopausal   . 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 Pleural effusion 10/25/2012   10/2012 CT chest >> small to moderate R lung effusion>> chylothorax, 100% lymphs  10/2013 thoracentesis> cytology negative, WBC 1471, > 90% "small lymphs" 01/2014 CT chest> near complete resolution of pleural effusion, stable lymphadenopathy 08/2014 CT chest [place] ([facility]): small right sided effusion decreased in size, stable mediastinal lymphadenopathy 1.0cm largest, 20m  . 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 Sleep apnea   . Stage III chronic kidney disease     Past Surgical History:  Procedure Laterality Date  . ABDOMINAL HYSTERECTOMY     at ge 39. secondary to bleeding/partial  . ANTERIOR CERVICAL DECOMP/DISCECTOMY FUSION N/A 10/10/2016   Procedure: Anterior Cervical Discectomy Fusion - Cervical four4- five - Cervical five-Cervical six - Cervical six-Cervical seven;  Surgeon: Earnie Larsson, MD;  Location: [place];  Service: Neurosurgery;  Laterality: N/A;  . BREAST CYST ASPIRATION Right   . CARPAL TUNNEL RELEASE Bilateral   . CHOLECYSTECTOMY     in [place]   . COLONOSCOPY WITH PROPOFOL N/A 01/09/2015   Procedure: COLONOSCOPY WITH PROPOFOL;  Surgeon: Lucilla Lame, MD;  Location: [facility] ENDOSCOPY;  Service: Endoscopy;  Laterality: N/A;  . CORONARY ANGIOPLASTY WITH STENT  PLACEMENT     [place]; [facility]  . EYE SURGERY     bil cataracts  . HERNIA REPAIR     umbilical/ Dr Pat Patrick  . PTCA  August 2012   Right Posterior tibial artery , [place]  . TOE AMPUTATION  Sept 2012   Right 2nd toe, [place]    There were no vitals filed for this visit.   Subjective: Patient behavior: (alertness, ability to follow instructions, etc.):  The patient is a poor historian.  When asked what her swallowing complaints were, she stated "just about every night I spit up phlegm" and one night "I woke up out of my sleep and  couldn't breathe".  She could not tell me about prandial swallowing difficulty.  Chief complaint:  The patient is not able to verbalize swallowing problems  Objective:  Radiological Procedure: A videoflouroscopic evaluation of oral-preparatory, reflex initiation, and pharyngeal phases of the swallow was performed; as well as a screening of the upper esophageal phase.  I. POSTURE: Upright in MBS chair  II. VIEW: Lateral  III. COMPENSATORY STRATEGIES: N/A  IV. BOLUSES ADMINISTERED:   Thin Liquid: 2 cup rim sips, 3 rapid consecutive drinks   Nectar-thick Liquid: 1 moderate swallow   Honey-thick Liquid: DNT   Puree: 2 teaspoon presentations   Mechanical Soft: 1/4 graham cracker in applesauce  V. RESULTS OF EVALUATION: A. ORAL PREPARATORY PHASE: (The lips, tongue, and velum are observed for strength and coordination)       **Overall Severity Rating: Within normal limits  B. SWALLOW INITIATION/REFLEX: (The reflex is normal if "triggered" by the time the bolus reached the base of the tongue)  **Overall Severity Rating: Mild; triggers at base of tonuge for solids and nectar-thick liquid, while falling from the valleculae to the pyriform sinuses with thin liquid  C. PHARYNGEAL PHASE: (Pharyngeal function is normal if the bolus shows rapid, smooth, and continuous transit through the pharynx and there is no pharyngeal residue after the swallow)  **Overall Severity Rating: Within normal limits  D. LARYNGEAL PENETRATION: (Material entering into the laryngeal inlet/vestibule but not aspirated) X1 during rapid consecutive thin liquid; shallow and transient  E. ASPIRATION: None  F. ESOPHAGEAL PHASE: (Screening of the upper esophagus): no observed abnormality within the viewable cervical esophagus  ASSESSMENT: This 76 year old woman; with poorly described subjective swallowing complaints, recent ACDF surgery (10/10/2016), and remote CVA (08/17/2012); is presenting with minimal oropharyngeal  dysphagia.  Oral control of the bolus including oral hold, rotary mastication, and anterior to posterior transfer are within normal limits. Timing of the pharyngeal swallow is minimally delayed.  Aspects of the pharyngeal stage of swallowing including tongue base retraction, hyolaryngeal excursion, epiglottic inversion, and duration/amplitude of UES opening are within functional limits.  There is no pharyngeal residue.  There was one episode of laryngeal penetration (shallow and transient) and no tracheal aspiration.  An esophageal sweep showed stasis of solid in the mid-esophagus with retrograde movement below the UES.  The patient does not appear to be at significant risk for prandial aspiration.  She reports that "just about every night I spit up phlegm" and one night "I woke up out of my sleep and couldn't breathe".  PLAN/RECOMMENDATIONS:   A. Diet: Regular   B. Swallowing Precautions: Standard   C. Recommended consultation to: GI   D. Therapy recommendations: provide follow up for education   E. Results and recommendations were discussed with the patient immediately following the study, routed to the referring MD,  and faxed to the treating SLP.    Patient will benefit from skilled therapeutic intervention in order to improve the following deficits and impairments:   Oropharyngeal dysphagia - Plan: DG OP Swallowing Func-Medicare/Speech Path, DG OP Swallowing Func-Medicare/Speech Path      G-Codes - 12-17-2016 1340    Functional Assessment Tool Used MBS, clinical judgment   Functional Limitations Swallowing   Swallow Current Status (Q1975) At least 1 percent but less than 20 percent impaired, limited or restricted   Swallow Goal Status (O8325) At least 1 percent but less than 20 percent impaired, limited or restricted   Swallow Discharge Status [id number]) At least 1 percent but less than 20 percent impaired, limited or restricted          Problem List Patient Active Problem List    Diagnosis Date Noted  . Chronic kidney disease (CKD), stage I 11/15/2016  . History of fall 11/12/2016  . Osteoarthritis 11/10/2016  . Cervical stenosis of spinal canal 10/10/2016  . Cervical radiculopathy at C7 07/02/2016  . Acute midline thoracic back pain 07/02/2016  . Epigastric hernia 04/01/2016  . Umbilical pain 41/58/3094  . CAD in native artery   . Type 2 DM with CKD stage 3 and hypertension ([place])   . Chronic renal insufficiency   . Shortness of breath 09/12/2015  . Hypertensive heart disease   . OSA on CPAP 07/07/2015  . Hospital discharge follow-up 07/07/2015  . Chest pain with low risk of acute coronary syndrome 06/29/2015  . Pleural effusion, right   . Chronic diastolic CHF (congestive heart failure) ([place]) 04/18/2015  . Diabetic neuropathy ([place]) 02/21/2015  . Benign neoplasm of sigmoid colon   . Low back pain with radiation 04/04/2014  . Essential hypertension 04/04/2014  . Hyperlipidemia 02/23/2014  . Long-term use of high-risk medication 02/23/2014  . Dermatophytic onychia 08/17/2013  . Stable angina ([place]) 08/17/2013  . Arteriosclerosis of coronary artery 08/17/2013  . Cerebral infarct ([place]) 08/17/2013  . Bony exostosis 08/17/2013  . Arthritis 08/05/2013  . History of colonic polyps 02/20/2013  . Sarcoidosis ([place]) 12/07/2012  . Dysphagia 12/07/2012  . Nocturnal cough 10/25/2012  . Pulmonary nodule seen on imaging study 05/20/2012  . 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 Cardiomyopathy, ischemic   . Hepatic steatosis   . Osteoporosis, post-menopausal   . Peripheral vascular disease due to secondary diabetes mellitus ([place])   . DM (diabetes mellitus), type 2, uncontrolled, with renal complications ([place]) 07/68/0881  . Double vessel coronary artery disease 12/14/2011   Leroy Sea, MS/CCC- SLP  Lou Miner 2016-12-17, 1:40 PM  [facility] [place], [place], [address] Phone: [phone]   Fax:     [name] MRN: [id number] Date of Birth: Mar 10, 1941

## 2016-12-13 ENCOUNTER — Other Ambulatory Visit: Payer: Self-pay | Admitting: Internal Medicine

## 2016-12-15 DIAGNOSIS — I5032 Chronic diastolic (congestive) heart failure: Secondary | ICD-10-CM | POA: Diagnosis not present

## 2016-12-15 DIAGNOSIS — I130 Hypertensive heart and chronic kidney disease with heart failure and stage 1 through stage 4 chronic kidney disease, or unspecified chronic kidney disease: Secondary | ICD-10-CM | POA: Diagnosis not present

## 2016-12-15 DIAGNOSIS — E1140 Type 2 diabetes mellitus with diabetic neuropathy, unspecified: Secondary | ICD-10-CM | POA: Diagnosis not present

## 2016-12-15 DIAGNOSIS — Z4789 Encounter for other orthopedic aftercare: Secondary | ICD-10-CM | POA: Diagnosis not present

## 2016-12-15 DIAGNOSIS — M4802 Spinal stenosis, cervical region: Secondary | ICD-10-CM | POA: Diagnosis not present

## 2016-12-15 DIAGNOSIS — E1151 Type 2 diabetes mellitus with diabetic peripheral angiopathy without gangrene: Secondary | ICD-10-CM | POA: Diagnosis not present

## 2016-12-16 ENCOUNTER — Telehealth: Payer: Self-pay | Admitting: *Deleted

## 2016-12-16 DIAGNOSIS — M4802 Spinal stenosis, cervical region: Secondary | ICD-10-CM | POA: Diagnosis not present

## 2016-12-16 DIAGNOSIS — E1140 Type 2 diabetes mellitus with diabetic neuropathy, unspecified: Secondary | ICD-10-CM | POA: Diagnosis not present

## 2016-12-16 DIAGNOSIS — I130 Hypertensive heart and chronic kidney disease with heart failure and stage 1 through stage 4 chronic kidney disease, or unspecified chronic kidney disease: Secondary | ICD-10-CM | POA: Diagnosis not present

## 2016-12-16 DIAGNOSIS — E1151 Type 2 diabetes mellitus with diabetic peripheral angiopathy without gangrene: Secondary | ICD-10-CM | POA: Diagnosis not present

## 2016-12-16 DIAGNOSIS — Z4789 Encounter for other orthopedic aftercare: Secondary | ICD-10-CM | POA: Diagnosis not present

## 2016-12-16 DIAGNOSIS — I5032 Chronic diastolic (congestive) heart failure: Secondary | ICD-10-CM | POA: Diagnosis not present

## 2016-12-16 NOTE — Telephone Encounter (Signed)
Please advise 

## 2016-12-16 NOTE — Telephone Encounter (Signed)
Patient requested clarence from [place] to drive again   Please advise   Pt contact [phone]

## 2016-12-17 DIAGNOSIS — E1151 Type 2 diabetes mellitus with diabetic peripheral angiopathy without gangrene: Secondary | ICD-10-CM | POA: Diagnosis not present

## 2016-12-17 DIAGNOSIS — I130 Hypertensive heart and chronic kidney disease with heart failure and stage 1 through stage 4 chronic kidney disease, or unspecified chronic kidney disease: Secondary | ICD-10-CM | POA: Diagnosis not present

## 2016-12-17 DIAGNOSIS — I5032 Chronic diastolic (congestive) heart failure: Secondary | ICD-10-CM | POA: Diagnosis not present

## 2016-12-17 DIAGNOSIS — E1140 Type 2 diabetes mellitus with diabetic neuropathy, unspecified: Secondary | ICD-10-CM | POA: Diagnosis not present

## 2016-12-17 DIAGNOSIS — I10 Essential (primary) hypertension: Secondary | ICD-10-CM | POA: Diagnosis not present

## 2016-12-17 DIAGNOSIS — Z4789 Encounter for other orthopedic aftercare: Secondary | ICD-10-CM | POA: Diagnosis not present

## 2016-12-17 DIAGNOSIS — Z6830 Body mass index (BMI) 30.0-30.9, adult: Secondary | ICD-10-CM | POA: Diagnosis not present

## 2016-12-17 DIAGNOSIS — M4802 Spinal stenosis, cervical region: Secondary | ICD-10-CM | POA: Diagnosis not present

## 2016-12-17 NOTE — Telephone Encounter (Signed)
LMTCB

## 2016-12-17 NOTE — Telephone Encounter (Signed)
She has no contraindications to driving if she has not had a seizure of fainting spell in 6 months

## 2016-12-18 ENCOUNTER — Telehealth: Payer: Self-pay | Admitting: *Deleted

## 2016-12-18 DIAGNOSIS — I130 Hypertensive heart and chronic kidney disease with heart failure and stage 1 through stage 4 chronic kidney disease, or unspecified chronic kidney disease: Secondary | ICD-10-CM | POA: Diagnosis not present

## 2016-12-18 DIAGNOSIS — I5032 Chronic diastolic (congestive) heart failure: Secondary | ICD-10-CM | POA: Diagnosis not present

## 2016-12-18 DIAGNOSIS — Z4789 Encounter for other orthopedic aftercare: Secondary | ICD-10-CM | POA: Diagnosis not present

## 2016-12-18 DIAGNOSIS — E1151 Type 2 diabetes mellitus with diabetic peripheral angiopathy without gangrene: Secondary | ICD-10-CM | POA: Diagnosis not present

## 2016-12-18 DIAGNOSIS — Z79899 Other long term (current) drug therapy: Secondary | ICD-10-CM

## 2016-12-18 DIAGNOSIS — M4802 Spinal stenosis, cervical region: Secondary | ICD-10-CM | POA: Diagnosis not present

## 2016-12-18 DIAGNOSIS — E1140 Type 2 diabetes mellitus with diabetic neuropathy, unspecified: Secondary | ICD-10-CM | POA: Diagnosis not present

## 2016-12-18 NOTE — Telephone Encounter (Signed)
Spoke with pt and she stated that she has gained the 3-4 lbs over the course of 3 or 4 days averaging a lb a day. The pt currently takes lasix 20mg  1 tablet every other day. The pt is wanting to know if she needs to increase her dose or start taking it daily?

## 2016-12-18 NOTE — Telephone Encounter (Signed)
Spoke with pt and she stated that she has not had a seizure or fainting spell in a very long time. The pt also stated tha she knows not to drive if she is not feeling well or feeling a little dizzy.

## 2016-12-18 NOTE — Telephone Encounter (Signed)
Reviewed chart.  She has a history of CAD and diastolic heart failure.  She sees cardiology regularly.  Also has CKD.  Given last met b with increased creatinine, need to see if any other symptoms.  (i.e., make sure no increased sob, chest pain, etc).  If any acute symptoms, she needs to be evaluated.  I also would like for her to let cardiology know of her symptoms.  She could increase for a couple of days and take 54m lasix daily (just for a couple of doses), but just for short period.  Feel needs to be seen.  [facility]

## 2016-12-18 NOTE — Telephone Encounter (Signed)
Spoke with pt and informed her of Dr. Bary Leriche message below. Pt gave a verbal understanding but stated that she would probably wait until Dr. Derrel Nip got back in on Monday. I advised the pt that she should at least give her cardiologist a call tomorrow pt stated that she would. Also advised the pt that if she starts having any SOBr or chest pain that she would need to go to the ER. Pt gave a verbal understanding.

## 2016-12-18 NOTE — Telephone Encounter (Signed)
FYI : patient wanted to report gaining 3- 4 lbs. She questioned if she should take a water pill.  Pt contact  [phone]

## 2016-12-19 ENCOUNTER — Telehealth: Payer: Self-pay | Admitting: Cardiovascular Disease

## 2016-12-19 DIAGNOSIS — N183 Chronic kidney disease, stage 3 unspecified: Secondary | ICD-10-CM

## 2016-12-19 DIAGNOSIS — I5032 Chronic diastolic (congestive) heart failure: Secondary | ICD-10-CM

## 2016-12-19 NOTE — Telephone Encounter (Signed)
Reviewed recommendations w/pt who is agreeable w/plan.  She will follow strict low sodium diet, monitor fluid intake, weigh daily, and have labs next week. Pt had no questions at this time. Lab order placed.

## 2016-12-19 NOTE — Telephone Encounter (Signed)
Agree with your dietary recs.  F/u bmet early next week given higher dose of lasix.

## 2016-12-19 NOTE — Telephone Encounter (Signed)
S/w pt who reports increased weight gain at 189 lbs for three days. Prior weights: 182-183 lbs.  She had been eating more egg and bacon sandwiches; often misses lunch then eats a sandwich for dinner. She was advised by Dr. Nicki Reaper, PCP, yesterday to increase lasix to daily instead of qod for a few days.  She has been having more egg and bacon this week and drinks soda, water and juice. Extra water this week.  No swelling in legs; some in the abdomen. Denies SOB arm, back, or jaw pain; no nausea or diaphoresis.  Pain in her right chest, describes it as "coming and going", lasting a couple of minutes. No pain at this time. She does have occasional pain in both shoulders from neck surgery. Pt has stage II CKD. BUN and creatinine trending up over the last 6 months. Most recent labs July 2018.  VS: BP 132/70 HR 72 Educated pt regarding low sodium diet and advised to follow this diet.  She is agreeable to refrain from eating bacon and other salty foods, discontinue sodas, monitor fluid intake and weights.  Will route to MD for further recommendations and need for labs as she is increasing frequency of lasix.

## 2016-12-19 NOTE — Telephone Encounter (Signed)
I agree with Dr Nicki Reaper.  She can increase lasix dose to 20 mg daily until Monday.  Come in Monday OV , if none available,  Give her an RN visit and  Have her get a BMET.  [name]

## 2016-12-19 NOTE — Telephone Encounter (Signed)
Pt calling stating she's had a recent weight gain About 182-183 is her normal  It has up about 3 pounds, its been up for about a week Takes her fluid pill every other day Today she's stating she woke up with chest pains on right side  Would like advise

## 2016-12-19 NOTE — Telephone Encounter (Signed)
Only time Monday would be 1130, you all have a provider mtg at 1215, this that okay?

## 2016-12-21 NOTE — Telephone Encounter (Signed)
Yes

## 2016-12-21 NOTE — Telephone Encounter (Signed)
yes

## 2016-12-22 ENCOUNTER — Ambulatory Visit (INDEPENDENT_AMBULATORY_CARE_PROVIDER_SITE_OTHER): Payer: Medicare Other

## 2016-12-22 ENCOUNTER — Encounter: Payer: Self-pay | Admitting: Internal Medicine

## 2016-12-22 ENCOUNTER — Other Ambulatory Visit: Payer: Self-pay

## 2016-12-22 ENCOUNTER — Ambulatory Visit: Payer: Medicare Other | Admitting: Internal Medicine

## 2016-12-22 ENCOUNTER — Ambulatory Visit (INDEPENDENT_AMBULATORY_CARE_PROVIDER_SITE_OTHER): Payer: Medicare Other | Admitting: Internal Medicine

## 2016-12-22 VITALS — BP 116/54 | HR 63 | Temp 98.3°F | Resp 16 | Ht 67.0 in | Wt 193.2 lb

## 2016-12-22 DIAGNOSIS — R0602 Shortness of breath: Secondary | ICD-10-CM | POA: Diagnosis not present

## 2016-12-22 DIAGNOSIS — J90 Pleural effusion, not elsewhere classified: Secondary | ICD-10-CM

## 2016-12-22 DIAGNOSIS — I119 Hypertensive heart disease without heart failure: Secondary | ICD-10-CM

## 2016-12-22 DIAGNOSIS — R140 Abdominal distension (gaseous): Secondary | ICD-10-CM

## 2016-12-22 DIAGNOSIS — R635 Abnormal weight gain: Secondary | ICD-10-CM

## 2016-12-22 DIAGNOSIS — I5032 Chronic diastolic (congestive) heart failure: Secondary | ICD-10-CM | POA: Diagnosis not present

## 2016-12-22 DIAGNOSIS — I255 Ischemic cardiomyopathy: Secondary | ICD-10-CM

## 2016-12-22 NOTE — Telephone Encounter (Signed)
Per chart coming at 330 pm today to see PCP. thanks

## 2016-12-22 NOTE — Patient Instructions (Signed)
Take your lasix dose tomorrow morning  I will let you know if you need to continue daily lasix  You can resume driving   Low-Sodium Eating Plan Sodium, which is an element that makes up salt, helps you maintain a healthy balance of fluids in your body. Too much sodium can increase your blood pressure and cause fluid and waste to be held in your body. Your health care provider or dietitian may recommend following this plan if you have high blood pressure (hypertension), kidney disease, liver disease, or heart failure. Eating less sodium can help lower your blood pressure, reduce swelling, and protect your heart, liver, and kidneys. What are tips for following this plan? General guidelines  Most people on this plan should limit their sodium intake to 1,500-2,000 mg (milligrams) of sodium each day. Reading food labels  The Nutrition Facts label lists the amount of sodium in one serving of the food. If you eat more than one serving, you must multiply the listed amount of sodium by the number of servings.  Choose foods with less than 140 mg of sodium per serving.  Avoid foods with 300 mg of sodium or more per serving. Shopping  Look for lower-sodium products, often labeled as "low-sodium" or "no salt added."  Always check the sodium content even if foods are labeled as "unsalted" or "no salt added".  Buy fresh foods. ? Avoid canned foods and premade or frozen meals. ? Avoid canned, cured, or processed meats  Buy breads that have less than 80 mg of sodium per slice. Cooking  Eat more home-cooked food and less restaurant, buffet, and fast food.  Avoid adding salt when cooking. Use salt-free seasonings or herbs instead of table salt or sea salt. Check with your health care provider or pharmacist before using salt substitutes.  Cook with plant-based oils, such as canola, sunflower, or olive oil. Meal planning  When eating at a restaurant, ask that your food be prepared with less salt  or no salt, if possible.  Avoid foods that contain MSG (monosodium glutamate). MSG is sometimes added to [place] food, bouillon, and some canned foods. What foods are recommended? The items listed may not be a complete list. Talk with your dietitian about what dietary choices are best for you. Grains Low-sodium cereals, including oats, puffed wheat and rice, and shredded wheat. Low-sodium crackers. Unsalted rice. Unsalted pasta. Low-sodium bread. Whole-grain breads and whole-grain pasta. Vegetables Fresh or frozen vegetables. "No salt added" canned vegetables. "No salt added" tomato sauce and paste. Low-sodium or reduced-sodium tomato and vegetable juice. Fruits Fresh, frozen, or canned fruit. Fruit juice. Meats and other protein foods Fresh or frozen (no salt added) meat, poultry, seafood, and fish. Low-sodium canned tuna and salmon. Unsalted nuts. Dried peas, beans, and lentils without added salt. Unsalted canned beans. Eggs. Unsalted nut butters. Dairy Milk. Soy milk. Cheese that is naturally low in sodium, such as ricotta cheese, fresh mozzarella, or Swiss cheese Low-sodium or reduced-sodium cheese. Cream cheese. Yogurt. Fats and oils Unsalted butter. Unsalted margarine with no trans fat. Vegetable oils such as canola or olive oils. Seasonings and other foods Fresh and dried herbs and spices. Salt-free seasonings. Low-sodium mustard and ketchup. Sodium-free salad dressing. Sodium-free light mayonnaise. Fresh or refrigerated horseradish. Lemon juice. Vinegar. Homemade, reduced-sodium, or low-sodium soups. Unsalted popcorn and pretzels. Low-salt or salt-free chips. What foods are not recommended? The items listed may not be a complete list. Talk with your dietitian about what dietary choices are best for you. Grains Instant  hot cereals. Bread stuffing, pancake, and biscuit mixes. Croutons. Seasoned rice or pasta mixes. Noodle soup cups. Boxed or frozen macaroni and cheese. Regular salted  crackers. Self-rising flour. Vegetables Sauerkraut, pickled vegetables, and relishes. Olives. [place] fries. Onion rings. Regular canned vegetables (not low-sodium or reduced-sodium). Regular canned tomato sauce and paste (not low-sodium or reduced-sodium). Regular tomato and vegetable juice (not low-sodium or reduced-sodium). Frozen vegetables in sauces. Meats and other protein foods Meat or fish that is salted, canned, smoked, spiced, or pickled. Bacon, ham, sausage, hotdogs, corned beef, chipped beef, packaged lunch meats, salt pork, jerky, pickled herring, anchovies, regular canned tuna, sardines, salted nuts. Dairy Processed cheese and cheese spreads. Cheese curds. Blue cheese. Feta cheese. String cheese. Regular cottage cheese. Buttermilk. Canned milk. Fats and oils Salted butter. Regular margarine. Ghee. Bacon fat. Seasonings and other foods Onion salt, garlic salt, seasoned salt, table salt, and sea salt. Canned and packaged gravies. Worcestershire sauce. Tartar sauce. Barbecue sauce. Teriyaki sauce. Soy sauce, including reduced-sodium. Steak sauce. Fish sauce. Oyster sauce. Cocktail sauce. Horseradish that you find on the shelf. Regular ketchup and mustard. Meat flavorings and tenderizers. Bouillon cubes. Hot sauce and Tabasco sauce. Premade or packaged marinades. Premade or packaged taco seasonings. Relishes. Regular salad dressings. Salsa. Potato and tortilla chips. Corn chips and puffs. Salted popcorn and pretzels. Canned or dried soups. Pizza. Frozen entrees and pot pies. Summary  Eating less sodium can help lower your blood pressure, reduce swelling, and protect your heart, liver, and kidneys.  Most people on this plan should limit their sodium intake to 1,500-2,000 mg (milligrams) of sodium each day.  Canned, boxed, and frozen foods are high in sodium. Restaurant foods, fast foods, and pizza are also very high in sodium. You also get sodium by adding salt to food.  Try to cook at  home, eat more fresh fruits and vegetables, and eat less fast food, canned, processed, or prepared foods. This information is not intended to replace advice given to you by your health care provider. Make sure you discuss any questions you have with your health care provider. Document Released: 10/18/2001 Document Revised: 04/21/2016 Document Reviewed: 04/21/2016 Elsevier Interactive Patient Education  2017 [employer].

## 2016-12-22 NOTE — Progress Notes (Signed)
Subjective:  Patient ID: Pamela Collier, female    DOB: 1941-01-05  Age: 76 y.o. MRN: [id number]  CC: The primary encounter diagnosis was Shortness of breath. Diagnoses of Abdominal distension, Pleural effusion, right, Hypertensive heart disease without heart failure, Abnormal weight gain, and Chronic diastolic CHF (congestive heart failure) ([place]) were also pertinent to this visit.  HPI Pamela Collier presents for follow up on recent weight gain (6 lbs by home scales) which occurred over a 3-4 day period last week.  She noticed increased abdominal girth without constipation, and presumed it  be due to fluid retention from increased salt in diet (has been eating bacon and eggs nearly  Every morning , and eEating out a lot since she has been unable to cook since  Her cervical spine surgery ).   She has not had orthopnea  Or increased work of breathing.  Patient has been taking an increased dose of furosemide (every other day dosing changed to daily dose 20 mg since August 8)  And her last dose of lasix was this morning.  He has intentionally increased her water intake as well and notes increased urination.    Her scale this morning reflects a loss of 1 lb compared to last week when she called (189).   Lab Results  Component Value Date   HGBA1C 7.5 (H) 10/08/2016     Has been moving bowels daily   Outpatient Medications Prior to Visit  Medication Sig Dispense Refill  . acetaminophen (TYLENOL) 500 MG tablet Take 1,000 mg by mouth every 6 (six) hours as needed for mild pain or headache.     . alendronate (FOSAMAX) 70 MG tablet Take 1 tablet (70 mg total) by mouth once a week. Pt takes on Sunday.   Take with a full glass of water on an empty stomach. 4 tablet 11  . ALPRAZolam (XANAX) 0.25 MG tablet Take 1 tablet (0.25 mg total) by mouth daily. 30 tablet 2  . aspirin EC 81 MG tablet Take 81 mg by mouth daily.    [name] BRILINTA 90 MG TABS tablet TAKE 1 TABLET TWICE A DAY 180 tablet 1  .  budesonide-formoterol (SYMBICORT) 160-4.5 MCG/ACT inhaler Inhale 2 puffs into the lungs 2 (two) times daily. 1 Inhaler 0  . diclofenac sodium (VOLTAREN) 1 % GEL Apply 2 g topically 4 (four) times daily.    [name] docusate sodium (COLACE) 100 MG capsule Take 100 mg by mouth 2 (two) times daily as needed for mild constipation. Reported on 11/09/2015    . fluticasone (FLONASE) 50 MCG/ACT nasal spray Place 2 sprays into both nostrils daily as needed for rhinitis.    . furosemide (LASIX) 20 MG tablet Take 1 tablet (20 mg total) by mouth daily. (Patient taking differently: Take 20 mg by mouth every other day. ) 90 tablet 1  . glucose blood (ACCU-CHEK AVIVA PLUS) test strip USE TO CHECK GLUCOSE  THREE TIMES DAILY 300 each 3  . HYDROcodone-acetaminophen (NORCO/VICODIN) 5-325 MG tablet Take 1-2 tablets by mouth every 4 (four) hours as needed (breakthrough pain). 30 tablet 0  . Insulin Lispro Prot & Lispro (HUMALOG MIX 75/25 KWIKPEN) (75-25) 100 UNIT/ML Kwikpen Inject 45 to 60 units twice daily before morning and evening meals (Patient taking differently: Inject 44 units in the morning and 60 units in the evening) 112 mL 3  . isosorbide mononitrate (IMDUR) 30 MG 24 hr tablet TAKE 1 TABLET DAILY 90 tablet 1  . losartan (COZAAR) 100 MG tablet TAKE  1 TABLET DAILY 90 tablet 1  . LYRICA 100 MG capsule TAKE ONE CAPSULE BY MOUTH THREE TIMES DAILY 90 capsule 8  . metoprolol succinate (TOPROL-XL) 25 MG 24 hr tablet Take 1 tablet (25 mg total) by mouth daily. 90 tablet 3  . nitroGLYCERIN (NITROSTAT) 0.4 MG SL tablet Place 1 tablet (0.4 mg total) under the tongue every 5 (five) minutes as needed for chest pain. 25 tablet 3  . ondansetron (ZOFRAN) 4 MG tablet Take 1 tablet (4 mg total) by mouth every 8 (eight) hours as needed for nausea or vomiting. 20 tablet 0  . PARoxetine (PAXIL) 20 MG tablet TAKE ONE TABLET BY MOUTH ONCE DAILY 90 tablet 1  . polyethylene glycol (MIRALAX / GLYCOLAX) packet Take 17 g by mouth daily as needed  for mild constipation. Reported on 10/25/2015    . rosuvastatin (CRESTOR) 20 MG tablet Take 20 mg by mouth daily.     . rosuvastatin (CRESTOR) 20 MG tablet TAKE ONE TABLET BY MOUTH ONCE DAILY (Patient not taking: Reported on 12/22/2016) 30 tablet 5   Facility-Administered Medications Prior to Visit  Medication Dose Route Frequency Provider Last Rate Last Dose  . albuterol (PROVENTIL) (5 MG/ML) 0.5% nebulizer solution 2.5 mg  2.5 mg Nebulization Once [name], MD        Review of Systems;  Patient denies headache, fevers, malaise, unintentional weight loss, skin rash, eye pain, sinus congestion and sinus pain, sore throat, dysphagia,  hemoptysis , cough, dyspnea, wheezing, chest pain, palpitations, orthopnea, edema, abdominal pain, nausea, melena, diarrhea, constipation, flank pain, dysuria, hematuria, urinary  Frequency, nocturia, numbness, tingling, seizures,  Focal weakness, Loss of consciousness,  Tremor, insomnia, depression, anxiety, and suicidal ideation.      Objective:  BP (!) 116/54 (BP Location: Left Arm, Patient Position: Sitting, Cuff Size: Normal)   Pulse 63   Temp 98.3 F (36.8 C) (Oral)   Resp 16   Ht 5\' 7"  (1.702 m)   Wt 193 lb 3.2 oz (87.6 kg)   SpO2 96%   BMI 30.26 kg/m   BP Readings from Last 3 Encounters:  12/22/16 (!) 116/54  11/27/16 (!) 124/58  11/15/16 (!) 160/65    Wt Readings from Last 3 Encounters:  12/22/16 193 lb 3.2 oz (87.6 kg)  11/27/16 187 lb (84.8 kg)  11/14/16 186 lb (84.4 kg)    General appearance: alert, cooperative and appears stated age Ears: normal TM's and external ear canals both ears Throat: lips, mucosa, and tongue normal; teeth and gums normal Neck: no adenopathy, no carotid bruit, supple, symmetrical, trachea midline and thyroid not enlarged, symmetric, no tenderness/mass/nodules Back: symmetric, no curvature. ROM normal. No CVA tenderness. Lungs: clear to auscultation bilaterally Heart: regular rate and rhythm, S1, S2  normal, no murmur, click, rub or gallop Abdomen: soft, non-tender; bowel sounds normal; no masses,  no organomegaly Pulses: 2+ and symmetric Skin: Skin color, texture, turgor normal. No rashes or lesions Lymph nodes: Cervical, supraclavicular, and axillary nodes normal.  Lab Results  Component Value Date   HGBA1C 7.5 (H) 10/08/2016   HGBA1C 7.1 06/18/2016   HGBA1C 7.6 (H) 03/14/2016    Lab Results  Component Value Date   CREATININE 1.39 (H) 11/14/2016   CREATININE 1.25 (H) 10/08/2016   CREATININE 1.05 (H) 06/29/2016    Lab Results  Component Value Date   WBC 7.0 11/14/2016   HGB 11.0 (L) 11/14/2016   HCT 32.8 (L) 11/14/2016   PLT 320 11/14/2016   GLUCOSE 147 (H) 11/14/2016  CHOL 124 11/15/2016   TRIG 175 (H) 11/15/2016   HDL 29 (L) 11/15/2016   LDLDIRECT 102.0 03/14/2016   LDLCALC 60 11/15/2016   ALT 14 06/29/2016   AST 18 06/29/2016   NA 140 11/14/2016   K 4.1 11/14/2016   CL 106 11/14/2016   CREATININE 1.39 (H) 11/14/2016   BUN 27 (H) 11/14/2016   CO2 27 11/14/2016   TSH 0.953 11/15/2016   INR 1.2 (H) 02/02/2015   HGBA1C 7.5 (H) 10/08/2016   MICROALBUR 28.9 (H) 09/26/2015    Assessment & Plan:   Problem List Items Addressed This Visit    Shortness of breath - Primary   Relevant Orders   DG Chest 2 View   Basic metabolic panel   DG Chest 2 View   B Nat Peptide (Completed)   Pleural effusion, right    Repeating her chst x ray today given weight gain of 6 lbs over the past week       Hypertensive heart disease    She has grade I diastolic dysfunction by 7673 ECHO and EF 50% .  Blood pressure is Well controlled on current regimen. Renal function  Is pending given recent increase in diuretic use.       Chronic diastolic CHF (congestive heart failure) ([facility])    Despite the weight gain there is no sign of heart failure by today's exam and BNP; however chest x ray has been ordered given the persistent dullness at the right base due to pleural effusion         Abnormal weight gain    She appears to have fluid retention suggested by her abdominal distension without sings of heart failure ( no orthopnea, hypoxia  And normal BNP) .  Will obtain an  abdominal ultrasound to rule out ascites.       Relevant Orders   US Abdomen Complete    Other Visit Diagnoses    Abdominal distension       Relevant Orders   DG Abd 1 View (Completed)   US Abdomen Complete      I am having Ms. Tilghman maintain her budesonide-formoterol, nitroGLYCERIN, acetaminophen, docusate sodium, fluticasone, rosuvastatin, polyethylene glycol, aspirin EC, Insulin Lispro Prot & Lispro, glucose blood, ondansetron, furosemide, metoprolol succinate, PARoxetine, BRILINTA, alendronate, isosorbide mononitrate, HYDROcodone-acetaminophen, diclofenac sodium, ALPRAZolam, LYRICA, and losartan.  No orders of the defined types were placed in this encounter.   Medications Discontinued During This Encounter  Medication Reason  . rosuvastatin (CRESTOR) 20 MG tablet Duplicate    Follow-up: No Follow-up on file.   [name], MD

## 2016-12-22 NOTE — Telephone Encounter (Signed)
Attempted to reach patient, scheduled for appt today at 1130 with PCP.  Waiting confirmation for appt. thanks

## 2016-12-23 ENCOUNTER — Encounter: Payer: Self-pay | Admitting: Internal Medicine

## 2016-12-23 ENCOUNTER — Telehealth: Payer: Self-pay | Admitting: *Deleted

## 2016-12-23 DIAGNOSIS — Z4789 Encounter for other orthopedic aftercare: Secondary | ICD-10-CM | POA: Diagnosis not present

## 2016-12-23 DIAGNOSIS — I5032 Chronic diastolic (congestive) heart failure: Secondary | ICD-10-CM | POA: Diagnosis not present

## 2016-12-23 DIAGNOSIS — I130 Hypertensive heart and chronic kidney disease with heart failure and stage 1 through stage 4 chronic kidney disease, or unspecified chronic kidney disease: Secondary | ICD-10-CM | POA: Diagnosis not present

## 2016-12-23 DIAGNOSIS — M4802 Spinal stenosis, cervical region: Secondary | ICD-10-CM | POA: Diagnosis not present

## 2016-12-23 DIAGNOSIS — R0602 Shortness of breath: Secondary | ICD-10-CM

## 2016-12-23 DIAGNOSIS — E1151 Type 2 diabetes mellitus with diabetic peripheral angiopathy without gangrene: Secondary | ICD-10-CM | POA: Diagnosis not present

## 2016-12-23 DIAGNOSIS — E1140 Type 2 diabetes mellitus with diabetic neuropathy, unspecified: Secondary | ICD-10-CM | POA: Diagnosis not present

## 2016-12-23 DIAGNOSIS — R635 Abnormal weight gain: Secondary | ICD-10-CM | POA: Insufficient documentation

## 2016-12-23 LAB — BASIC METABOLIC PANEL
BUN: 37 mg/dL — AB (ref 6–23)
CHLORIDE: 102 meq/L (ref 96–112)
CO2: 26 meq/L (ref 19–32)
CREATININE: 1.63 mg/dL — AB (ref 0.40–1.20)
Calcium: 9 mg/dL (ref 8.4–10.5)
GFR: 39.38 mL/min — ABNORMAL LOW (ref >60.00)
GLUCOSE: 107 mg/dL — AB (ref 70–99)
POTASSIUM: 4 meq/L (ref 3.5–5.1)
Sodium: 135 mEq/L (ref 135–145)

## 2016-12-23 LAB — BRAIN NATRIURETIC PEPTIDE: Brain Natriuretic Peptide: 71.8 pg/mL (ref <100)

## 2016-12-23 NOTE — Telephone Encounter (Signed)
Spoke with patient this morning & she has an appt at the hospital tomorrow (setup by her heart doctor). She was planning to have the CXR there at that time. I believe the order will need to be changed to [facility] We are unable to change it.  Thanks

## 2016-12-23 NOTE — Assessment & Plan Note (Signed)
Despite the weight gain there is no sign of heart failure by today's exam and BNP; however chest x ray has been ordered given the persistent dullness at the right base due to pleural effusion

## 2016-12-23 NOTE — Assessment & Plan Note (Signed)
Repeating her chst x ray today given weight gain of 6 lbs over the past week

## 2016-12-23 NOTE — Telephone Encounter (Signed)
Yes

## 2016-12-23 NOTE — Telephone Encounter (Signed)
Left message on Monday afternoon for patient to return my call. A CXR was ordered after patient left the office. Need patient to return for CXR soon.

## 2016-12-23 NOTE — Telephone Encounter (Signed)
Is it ok to change the order so the pt can have this CXR done at the hospital tomorrow when she goes to see her heart doctor?

## 2016-12-23 NOTE — Assessment & Plan Note (Addendum)
She has grade I diastolic dysfunction by 7357 ECHO and EF 50% .  Blood pressure is Well controlled on current regimen. Renal function  Is pending given recent increase in diuretic use.

## 2016-12-23 NOTE — Assessment & Plan Note (Signed)
She appears to have fluid retention suggested by her abdominal distension without sings of heart failure ( no orthopnea, hypoxia  And normal BNP) .  Will obtain an  abdominal ultrasound to rule out ascites.

## 2016-12-24 ENCOUNTER — Ambulatory Visit
Admission: RE | Admit: 2016-12-24 | Discharge: 2016-12-24 | Disposition: A | Discharge status: Home / Self Care | Payer: Medicare Other | Source: Ambulatory Visit | Attending: Internal Medicine | Admitting: Internal Medicine

## 2016-12-24 DIAGNOSIS — R0602 Shortness of breath: Secondary | ICD-10-CM | POA: Insufficient documentation

## 2016-12-24 DIAGNOSIS — R079 Chest pain, unspecified: Secondary | ICD-10-CM | POA: Diagnosis not present

## 2016-12-24 NOTE — Telephone Encounter (Signed)
Reordered for [facility]

## 2016-12-25 DIAGNOSIS — I130 Hypertensive heart and chronic kidney disease with heart failure and stage 1 through stage 4 chronic kidney disease, or unspecified chronic kidney disease: Secondary | ICD-10-CM | POA: Diagnosis not present

## 2016-12-25 DIAGNOSIS — Z4789 Encounter for other orthopedic aftercare: Secondary | ICD-10-CM | POA: Diagnosis not present

## 2016-12-25 DIAGNOSIS — M4802 Spinal stenosis, cervical region: Secondary | ICD-10-CM | POA: Diagnosis not present

## 2016-12-25 DIAGNOSIS — E1151 Type 2 diabetes mellitus with diabetic peripheral angiopathy without gangrene: Secondary | ICD-10-CM | POA: Diagnosis not present

## 2016-12-25 DIAGNOSIS — E1140 Type 2 diabetes mellitus with diabetic neuropathy, unspecified: Secondary | ICD-10-CM | POA: Diagnosis not present

## 2016-12-25 DIAGNOSIS — I5032 Chronic diastolic (congestive) heart failure: Secondary | ICD-10-CM | POA: Diagnosis not present

## 2016-12-29 ENCOUNTER — Ambulatory Visit
Admission: RE | Admit: 2016-12-29 | Discharge: 2016-12-29 | Disposition: A | Discharge status: Home / Self Care | Payer: Medicare Other | Source: Ambulatory Visit | Attending: Internal Medicine | Admitting: Internal Medicine

## 2016-12-29 DIAGNOSIS — E1151 Type 2 diabetes mellitus with diabetic peripheral angiopathy without gangrene: Secondary | ICD-10-CM | POA: Diagnosis not present

## 2016-12-29 DIAGNOSIS — I130 Hypertensive heart and chronic kidney disease with heart failure and stage 1 through stage 4 chronic kidney disease, or unspecified chronic kidney disease: Secondary | ICD-10-CM | POA: Diagnosis not present

## 2016-12-29 DIAGNOSIS — Z4789 Encounter for other orthopedic aftercare: Secondary | ICD-10-CM | POA: Diagnosis not present

## 2016-12-29 DIAGNOSIS — R140 Abdominal distension (gaseous): Secondary | ICD-10-CM | POA: Insufficient documentation

## 2016-12-29 DIAGNOSIS — E1140 Type 2 diabetes mellitus with diabetic neuropathy, unspecified: Secondary | ICD-10-CM | POA: Diagnosis not present

## 2016-12-29 DIAGNOSIS — M4802 Spinal stenosis, cervical region: Secondary | ICD-10-CM | POA: Diagnosis not present

## 2016-12-29 DIAGNOSIS — R635 Abnormal weight gain: Secondary | ICD-10-CM | POA: Diagnosis not present

## 2016-12-29 DIAGNOSIS — I5032 Chronic diastolic (congestive) heart failure: Secondary | ICD-10-CM | POA: Diagnosis not present

## 2017-01-01 DIAGNOSIS — I130 Hypertensive heart and chronic kidney disease with heart failure and stage 1 through stage 4 chronic kidney disease, or unspecified chronic kidney disease: Secondary | ICD-10-CM | POA: Diagnosis not present

## 2017-01-01 DIAGNOSIS — Z4789 Encounter for other orthopedic aftercare: Secondary | ICD-10-CM | POA: Diagnosis not present

## 2017-01-01 DIAGNOSIS — M4802 Spinal stenosis, cervical region: Secondary | ICD-10-CM | POA: Diagnosis not present

## 2017-01-01 DIAGNOSIS — I5032 Chronic diastolic (congestive) heart failure: Secondary | ICD-10-CM | POA: Diagnosis not present

## 2017-01-01 DIAGNOSIS — E1140 Type 2 diabetes mellitus with diabetic neuropathy, unspecified: Secondary | ICD-10-CM | POA: Diagnosis not present

## 2017-01-01 DIAGNOSIS — E1151 Type 2 diabetes mellitus with diabetic peripheral angiopathy without gangrene: Secondary | ICD-10-CM | POA: Diagnosis not present

## 2017-01-02 DIAGNOSIS — E1042 Type 1 diabetes mellitus with diabetic polyneuropathy: Secondary | ICD-10-CM | POA: Diagnosis not present

## 2017-01-02 DIAGNOSIS — L97511 Non-pressure chronic ulcer of other part of right foot limited to breakdown of skin: Secondary | ICD-10-CM | POA: Diagnosis not present

## 2017-01-02 DIAGNOSIS — L851 Acquired keratosis [keratoderma] palmaris et plantaris: Secondary | ICD-10-CM | POA: Diagnosis not present

## 2017-01-02 DIAGNOSIS — B351 Tinea unguium: Secondary | ICD-10-CM | POA: Diagnosis not present

## 2017-01-08 ENCOUNTER — Other Ambulatory Visit: Payer: Self-pay | Admitting: Cardiovascular Disease

## 2017-01-09 DIAGNOSIS — E113293 Type 2 diabetes mellitus with mild nonproliferative diabetic retinopathy without macular edema, bilateral: Secondary | ICD-10-CM | POA: Diagnosis not present

## 2017-01-13 NOTE — Telephone Encounter (Signed)
Non urgent. Can wait for your return.   Called pt to make sure she hasn't been having chest pains. The pt stated that once in a while she will have a pain in her chest but it doesn't last long at all. Advised the pt that if she continues to have them or if they get worse then she will need to go to the ER to be evaluated. The pt stated that she understood. Is it okay to change her nitro to the spray?

## 2017-01-13 NOTE — Telephone Encounter (Signed)
Pt lvm requesting a refill of her medication that she takes for her chest pain. She said that it is a pill that she puts under her tongue. [facility] has said that she needs the spray since that would be easier for her. Pt cb [phone]

## 2017-01-14 MED ORDER — NITROGLYCERIN 0.4 MG/SPRAY TL SOLN
1.0000 | 12 refills | Status: AC | PRN
Start: 2017-01-14 — End: ?

## 2017-01-14 NOTE — Telephone Encounter (Signed)
Yes ok to change to nitroglycerin spray . Med sent

## 2017-01-15 DIAGNOSIS — H35373 Puckering of macula, bilateral: Secondary | ICD-10-CM | POA: Diagnosis not present

## 2017-01-17 ENCOUNTER — Other Ambulatory Visit: Payer: Self-pay | Admitting: Internal Medicine

## 2017-01-23 ENCOUNTER — Telehealth: Payer: Self-pay | Admitting: Internal Medicine

## 2017-01-23 MED ORDER — TRAMADOL HCL 50 MG PO TABS: 50.0000 mg | ORAL_TABLET | Freq: Four times a day (QID) | ORAL | 0 refills | Status: DC | PRN

## 2017-01-23 NOTE — Telephone Encounter (Signed)
Pt lvm on referral line requesting a nurse to call her back.She said that she is having bad pain in  Her left arm and she wants to know what she can do to help it. Please cb [phone]

## 2017-01-23 NOTE — Telephone Encounter (Signed)
She has cervical spinal stenosis (MRI March 2018) AND UNDERWENT anterior decompression and fusion by Dr Trenton Gammon from c4-c7 in June.  If the pain is NOT accompanied by chest pain, it is likely due to her disk issues.  I will send an rx for tramadol (pain reliever) that she can take with tylenol every 6 hours if needed  Is she still taking Lyrica ?

## 2017-01-23 NOTE — Telephone Encounter (Signed)
Patient called and left message on referral line complaining of left arm pain. Called patient and patient describes pain as sharp pain shooting up and down. Patient says she has had some right arm pain but it was more of an aching pain around the shoulder. Patient also stated that she had some chest discomfort. Patient says this happened yesterday and she is not hurting this morning. Advised patient if she experienced any of the symptoms listed above to use nitroglycerin as directed and have someone drive her to ED or call 911.

## 2017-01-23 NOTE — Telephone Encounter (Signed)
Patient states that she is still taking the lyrica. I told her pain could be coming from her surgery in June. I told her we were sending in script for tramadol that she could take every 6 hours with Tylenol only if pain was NOT accompanied by chest pain. Rx faxed to [place]. Patient gave verbal understanding about medication and also to go to ED if she has any chest discomfort.

## 2017-02-03 IMAGING — DX DG CHEST 2V
2 series · 2 of 2 positions shown · non-contrast
Comparison: Chest radiographs 03/28/2014.  Abdominal CT 03/28/2014.

CLINICAL DATA: Follow-up pleural effusion. History of sarcoidosis,
cardiomyopathy and Coronary artery disease. Subsequent encounter.

EXAM:
CHEST  2 VIEW

[chest pa]
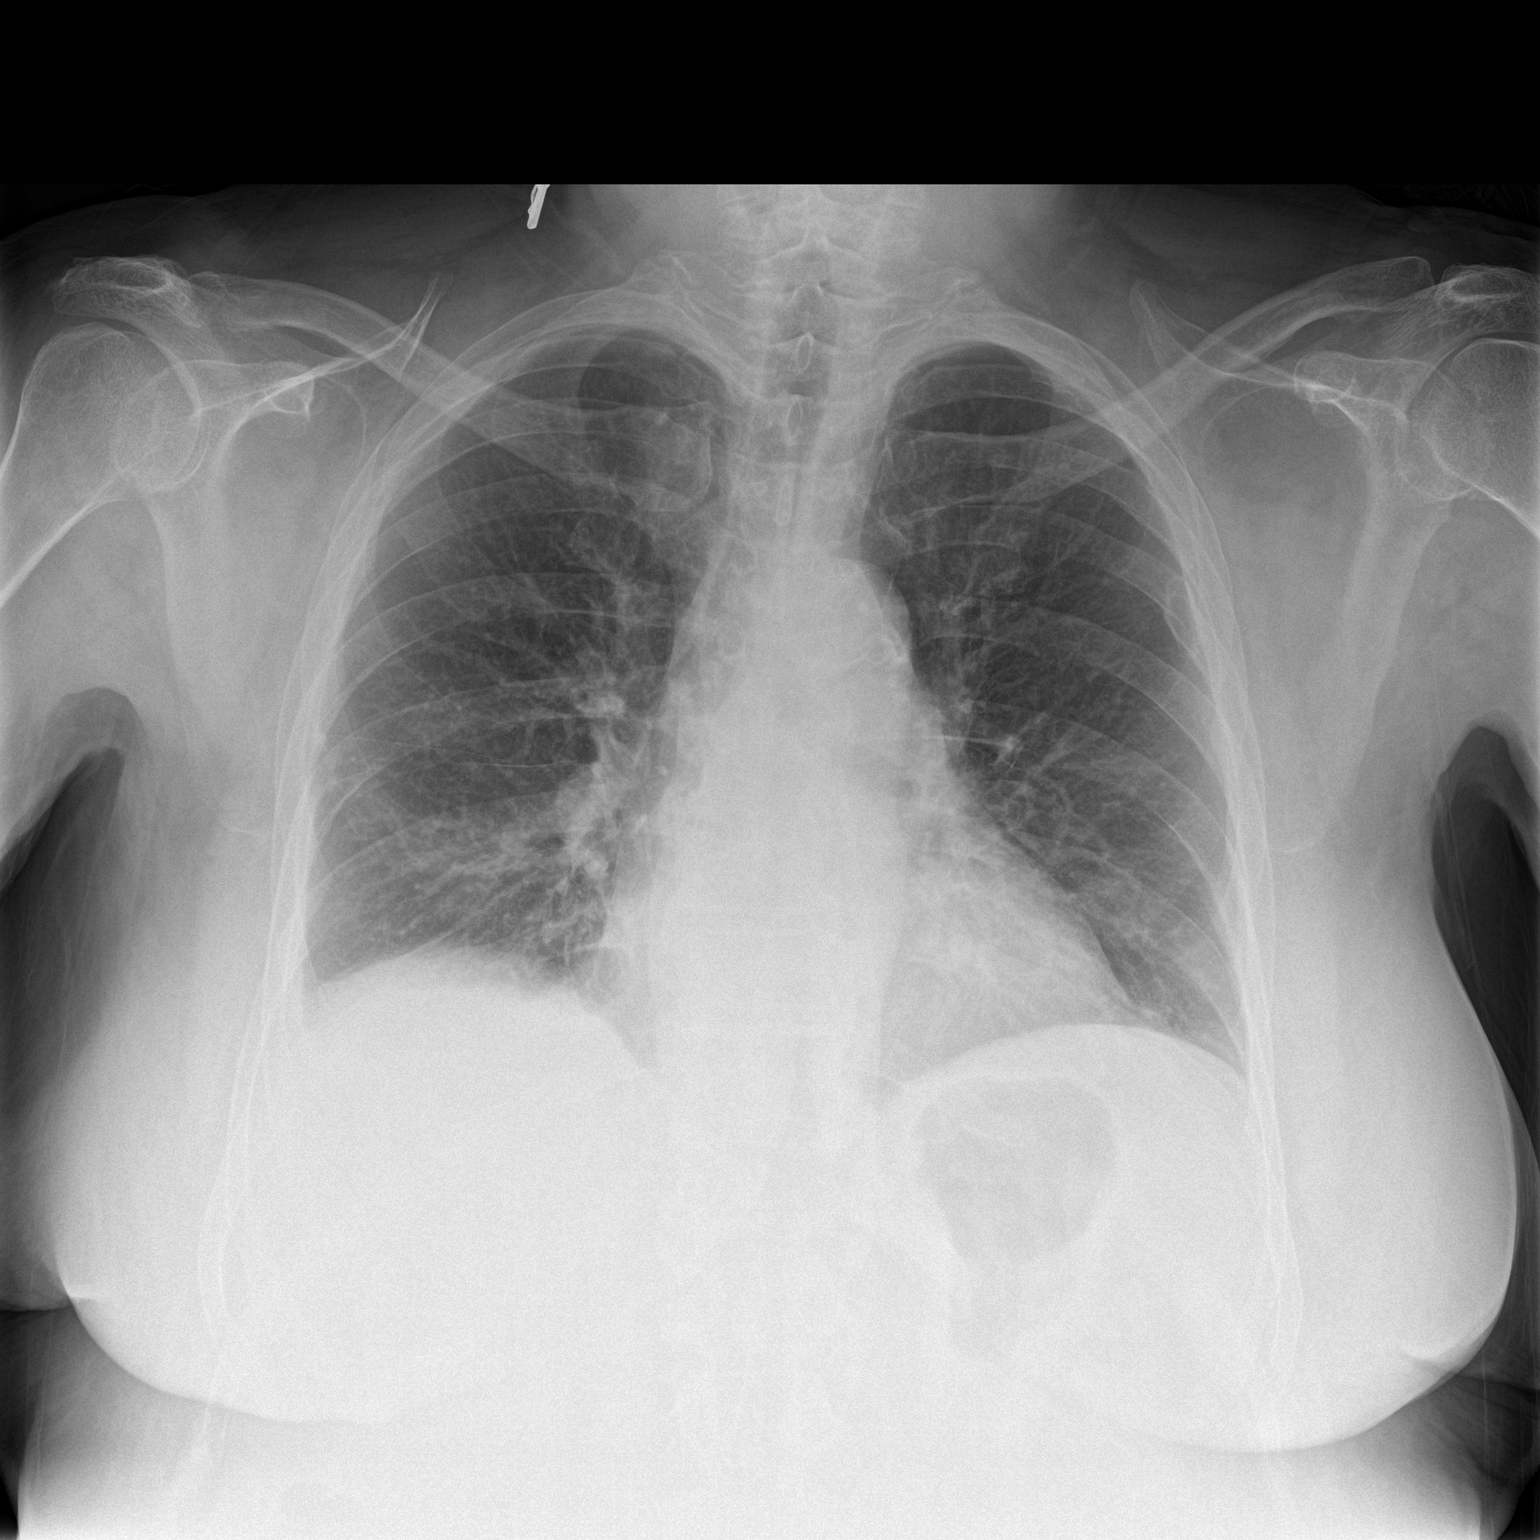

[chest lat]
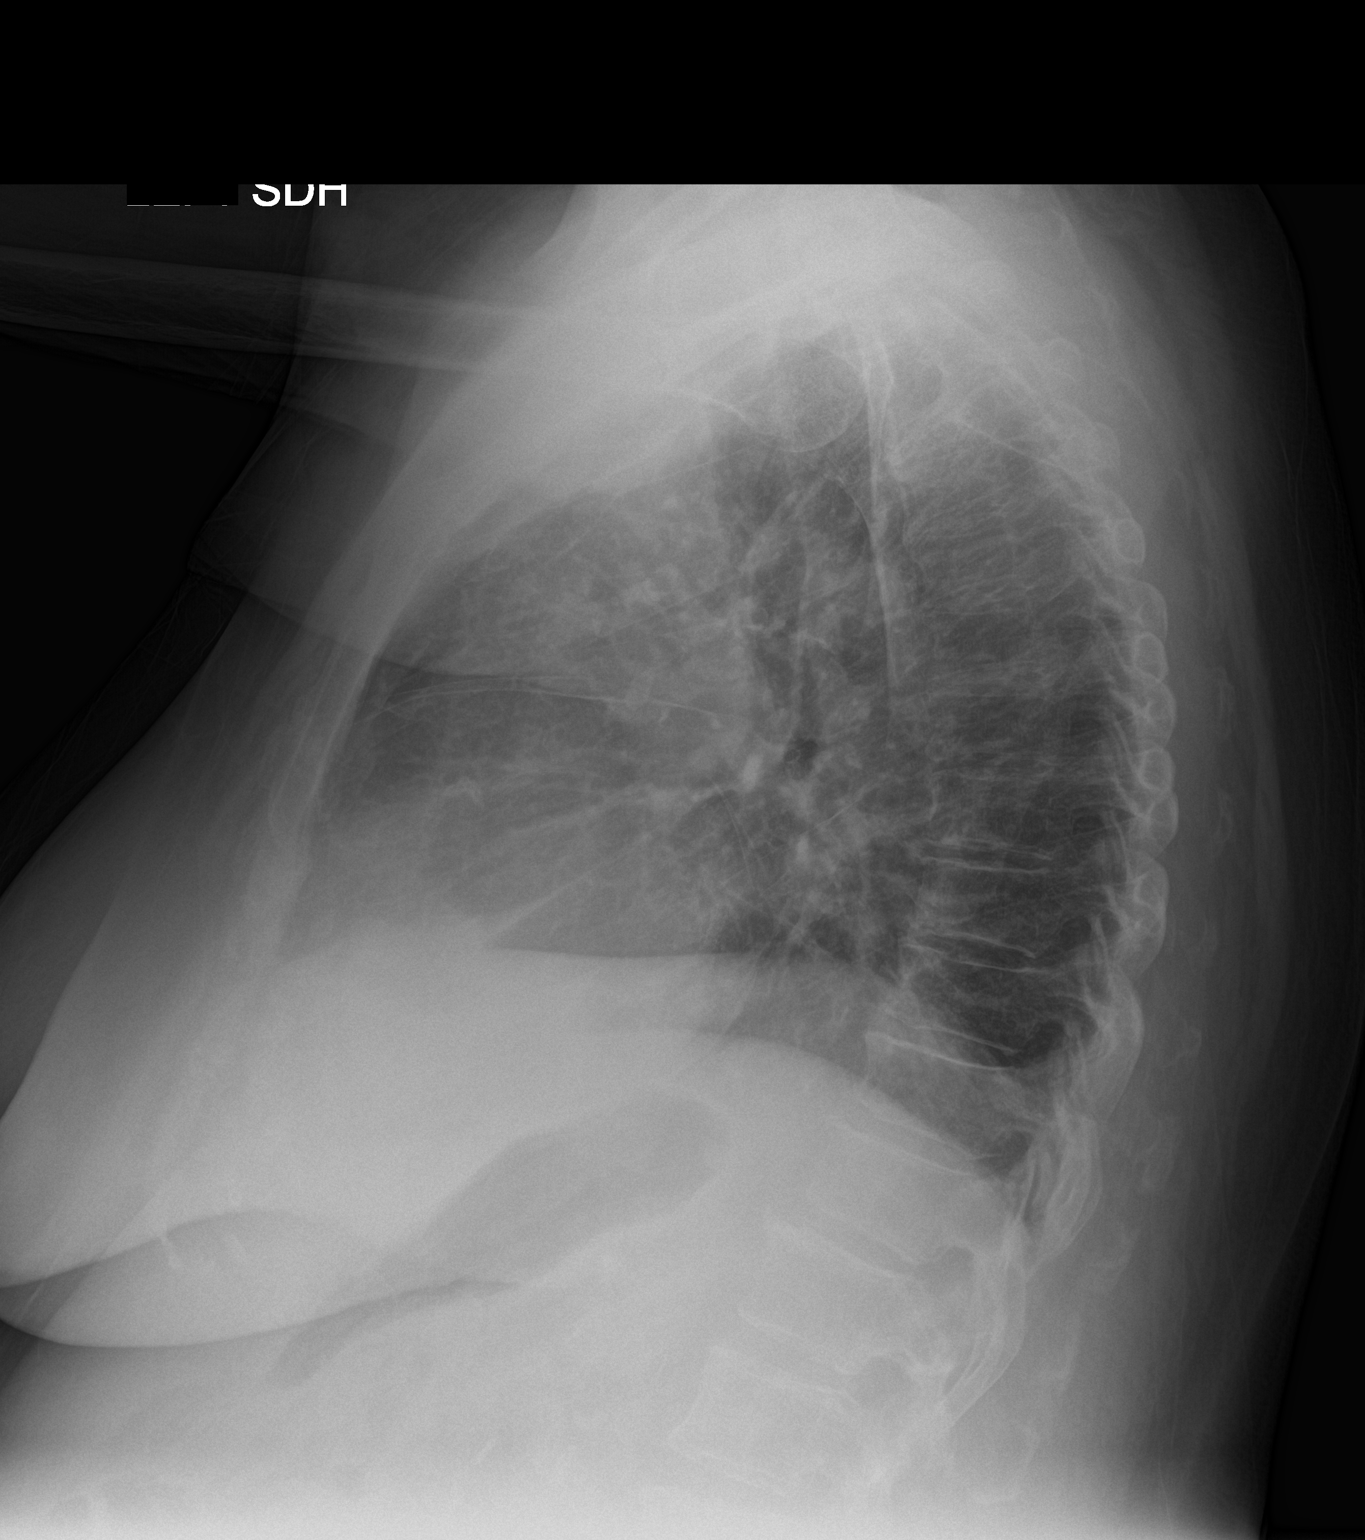

[2 of 2 positions shown; findings below may reference images not displayed]

FINDINGS: The heart size and mediastinal contours are stable. There is aortic
and coronary artery atherosclerosis. Previously demonstrated right
pleural effusion has improved. There is improved aeration of the
perihilar regions of both lungs. No confluent airspace opacity or
definite edema identified. Old left-sided rib fracture noted.
IMPRESSION: Interval improved right pleural effusion and basilar aeration.
Underlying chronic sequela of granulomatous disease grossly stable.

## 2017-02-04 DIAGNOSIS — L97511 Non-pressure chronic ulcer of other part of right foot limited to breakdown of skin: Secondary | ICD-10-CM | POA: Diagnosis not present

## 2017-02-04 NOTE — Telephone Encounter (Signed)
Error

## 2017-02-04 NOTE — Telephone Encounter (Signed)
Orders

## 2017-02-04 NOTE — Telephone Encounter (Signed)
error 

## 2017-02-05 NOTE — Telephone Encounter (Signed)
Error

## 2017-02-06 NOTE — Telephone Encounter (Signed)
Error

## 2017-02-10 DIAGNOSIS — E1122 Type 2 diabetes mellitus with diabetic chronic kidney disease: Secondary | ICD-10-CM | POA: Diagnosis not present

## 2017-02-10 DIAGNOSIS — N183 Chronic kidney disease, stage 3 (moderate): Secondary | ICD-10-CM | POA: Diagnosis not present

## 2017-02-10 DIAGNOSIS — R809 Proteinuria, unspecified: Secondary | ICD-10-CM | POA: Diagnosis not present

## 2017-02-10 DIAGNOSIS — I129 Hypertensive chronic kidney disease with stage 1 through stage 4 chronic kidney disease, or unspecified chronic kidney disease: Secondary | ICD-10-CM | POA: Diagnosis not present

## 2017-02-24 IMAGING — CT CT ABD-PELV W/ CM
1 of 3 series · 14 of 32 positions shown, 18 images · IV contrast (omnipaque)
Comparison: CT of the abdomen and pelvis from 03/28/2014, and
lumbar spine MRI performed 07/12/2014

CLINICAL DATA: Acute onset of right-sided abdominal pain and chest
pain. Shortness of breath. Diarrhea. Initial encounter.

EXAM:
CT ABDOMEN AND PELVIS WITH CONTRAST
TECHNIQUE: Multidetector CT imaging of the abdomen and pelvis was performed
using the standard protocol following bolus administration of
intravenous contrast.
CONTRAST:  80mL OMNIPAQUE IOHEXOL 300 MG/ML  SOLN

[Series 2: routine abd pel with · axial · 0.72mm/px · z∈[-487,-12]mm · 14 of 105 slices shown, 18 images]
[im 5/105  soft-tissue]
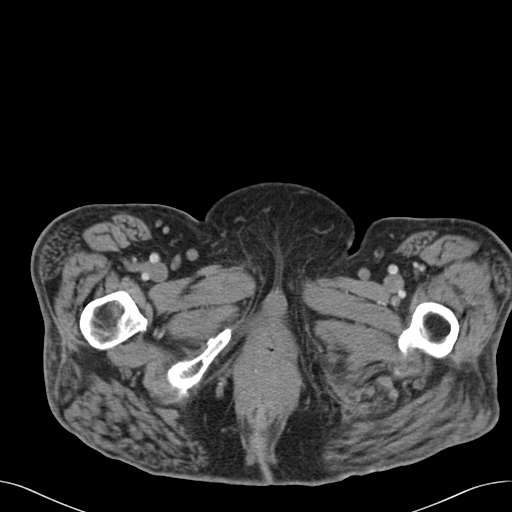
[im 5/105  bone]
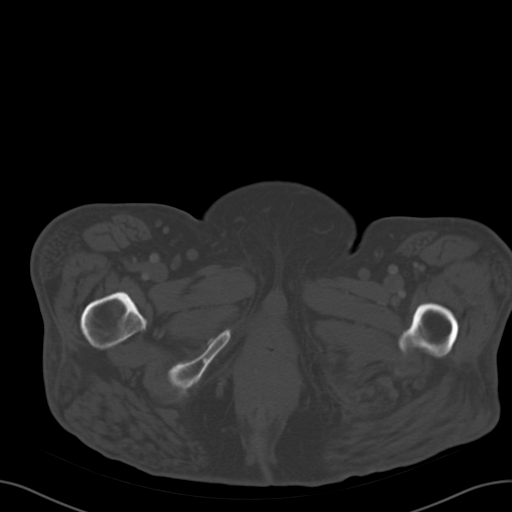
[im 15/105  soft-tissue]
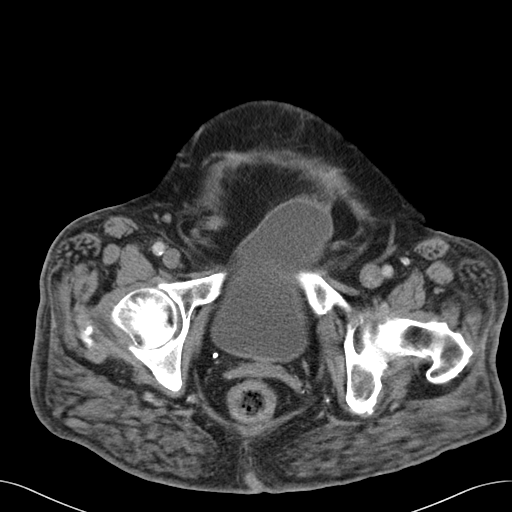
[im 25/105  soft-tissue]
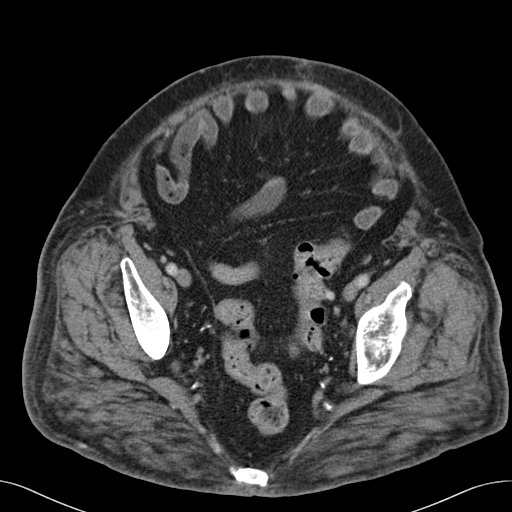
[im 30/105  soft-tissue]
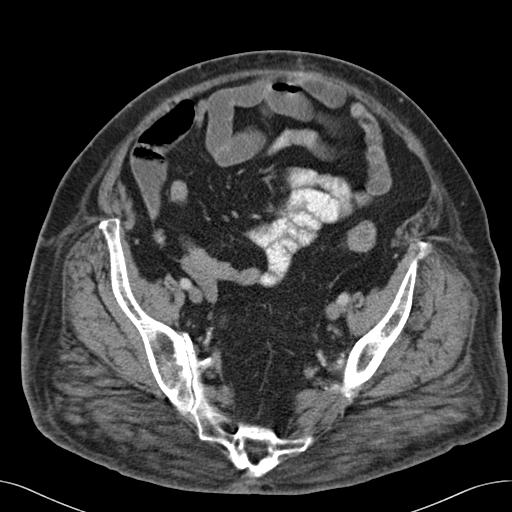
[im 40/105  soft-tissue]
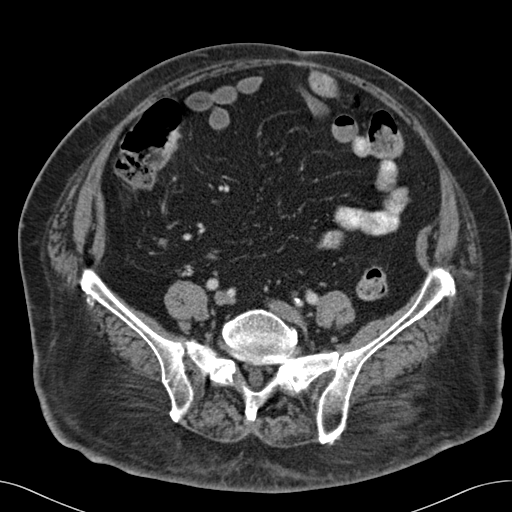
[im 50/105  soft-tissue]
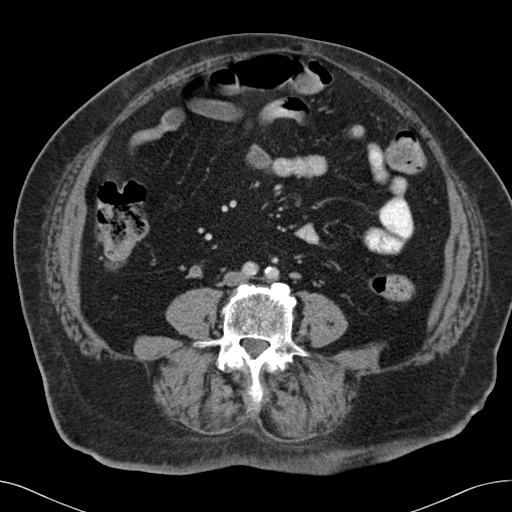
[im 55/105  soft-tissue]
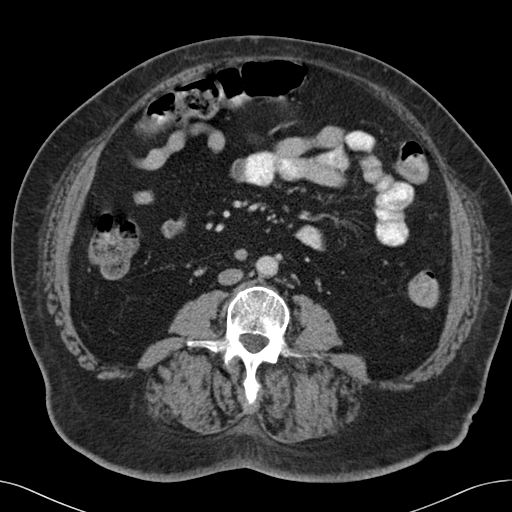
[im 65/105  soft-tissue]
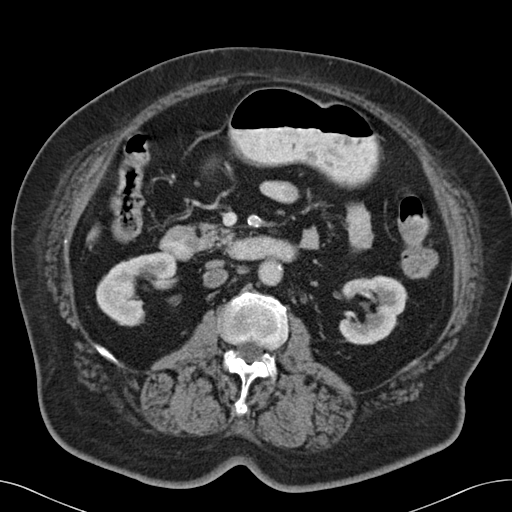
[im 75/105  soft-tissue]
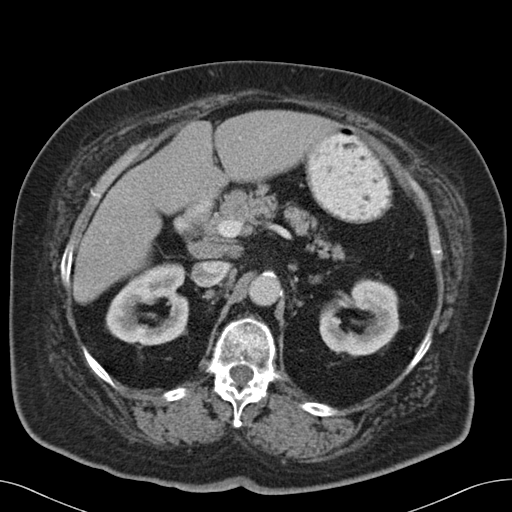
[im 75/105  bone]
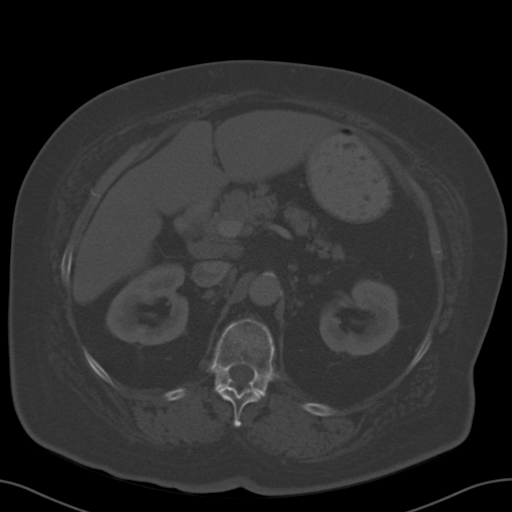
[im 80/105  soft-tissue]
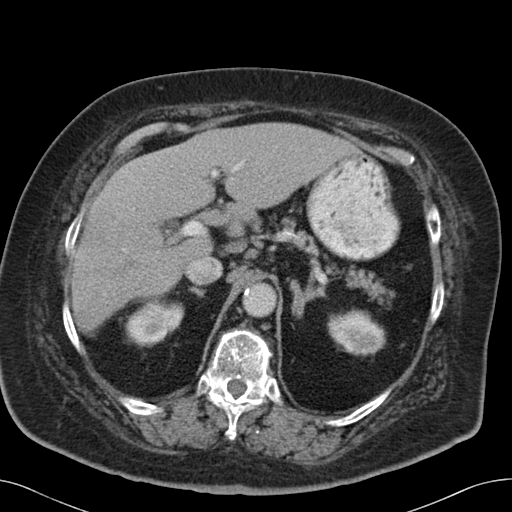
[im 85/105  lung]
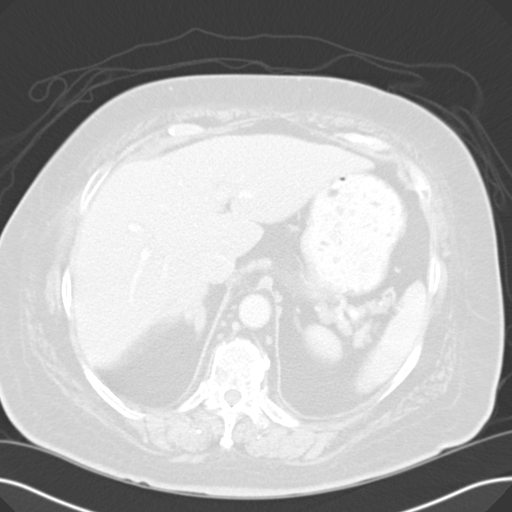
[im 90/105  soft-tissue]
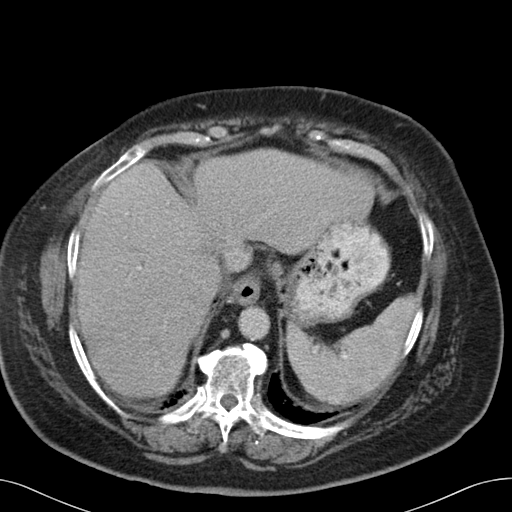
[im 90/105  lung]
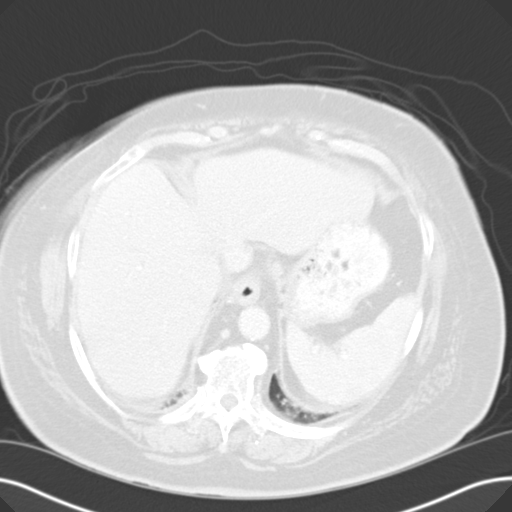
[im 95/105  lung]
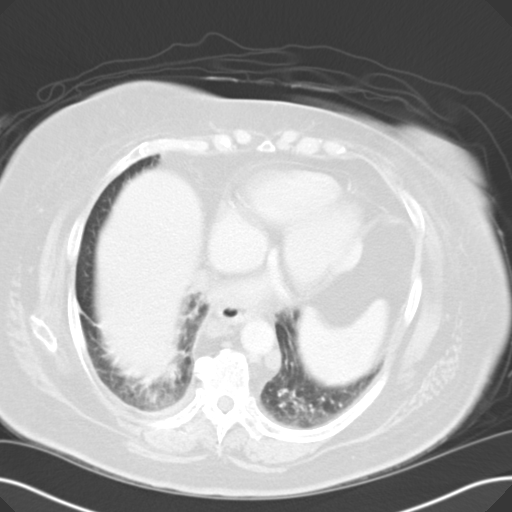
[im 100/105  soft-tissue]
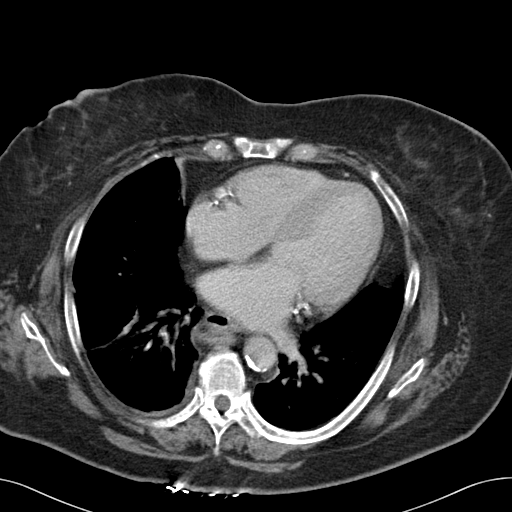
[im 100/105  lung]
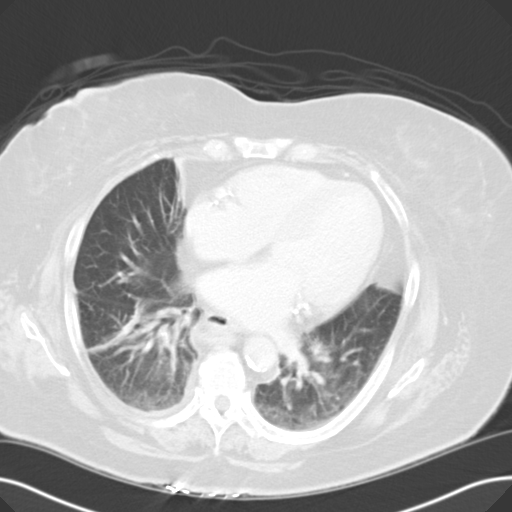

[14 of 32 positions shown; findings below may reference images not displayed]

FINDINGS: Mild bibasilar atelectasis is noted. Diffuse coronary artery
calcifications are seen. Scattered calcified nodes are noted at the
azygoesophageal recess and in a bilateral peribronchial
distribution.

Mild wall thickening along the distal esophagus is similar in
appearance to the prior study.

Prominent nodes are seen about the mid to distal esophagus,
measuring up to 1.6 cm in short axis. Scattered nodes are noted
about the celiac trunk, measuring up to 1.2 cm in short axis.
Paracaval nodes measure up to 1.0 cm in short axis.

The liver and spleen are unremarkable in appearance. The gallbladder
is within normal limits. The pancreas and adrenal glands are
unremarkable.

Nonspecific perinephric stranding is noted bilaterally. The kidneys
are otherwise unremarkable. There is no evidence of hydronephrosis.
No renal or ureteral stones are seen.

No free fluid is identified. The small bowel is unremarkable in
appearance. The stomach is within normal limits. No acute vascular
abnormalities are seen. Scattered calcification is noted along the
abdominal aorta and its branches, including along the course of the
superior mesenteric artery and at the origins of the renal arteries
bilaterally.

The appendix is diminutive and unremarkable in appearance. There is
no evidence of appendicitis. Minimal diverticulosis is noted along
the sigmoid colon, without evidence of diverticulitis.

The bladder is mildly distended and grossly remarkable. The patient
is status post hysterectomy. No suspicious adnexal masses are seen.
The right ovary is unremarkable in appearance. No inguinal
lymphadenopathy is seen.

No acute osseous abnormalities are identified.
IMPRESSION: 1. No acute abnormality seen to explain the patient's symptoms.
2. Mild wall thickening along the distal esophagus is similar in
appearance to prior studies, with enlarged surrounding nodes again
seen. These are of uncertain significance. Scattered nodes about the
celiac trunk and mildly prominent paracaval nodes. This may reflect
sequelae of prior granulomatous disease, given relative stability
from 5073.
3. Diffuse coronary artery calcifications seen.
4. Mild bibasilar atelectasis noted.
5. Scattered calcification along the abdominal aorta and its
branches, including along the course of the superior mesenteric
artery and at the origins of the renal arteries bilaterally.
6. Minimal diverticulosis along the sigmoid colon, without evidence
of diverticulitis.

## 2017-02-25 NOTE — Progress Notes (Signed)
Cardiology Office Note  Date:  02/26/2017   ID:  Pamela Collier, DOB [birth year], MRN [id number]  PCP:  Pamela Mc, MD   Chief Complaint  Patient presents with  . OTHER    3 month f/u c/o stomach ache and bloating. Meds reviewed verbally with pt.    HPI:  [name] is a pleasant 76 year old woman with history of  diabetes type 2, coronary artery disease,  stent to the LAD, circumflex and RCA,  cardiac catheterizations 2010,2011, 2013, 2016 most recently in [place] with stent to the RCA at that time,  pulmonary sarcoidosis  with moderate airflow restriction, mild restrictive disease on PFTs in 2014, chronic wheezing requiring albuterol nebulizer, pleural effusion presenting 01/11/2015 on CT scan on the right that appear to resolve on follow-up x-rays the end of September 2016,  who presents for routine follow-up of her shortness of breath, chest pain  In follow-up today she reports having some discomfort in her stomach when she wakes up Sometimes with constipation Takes a powder, maybe miralex, does not take this on a regular basis, Only takes as needed  Lab work reviewed with her BNP 71 CR 1.6 on lasix daily She reports renal function improved by taking Lasix every other day  Previous imaging studies reviewed Stress test 11/2016 No ischemia, EF 43%  Echo 11/2016 55 to 60%, normal  Total chol 145, ldl 66 HBA1C 7.1 In 06/2016  EKG personally reviewed by myself on todays visit Shows normal sinus rhythm with rate 66 bpm poor R wave progression through the anterior precordial leads, left axis deviation  Other past medical history reviewed In the ER 06/29/2016 sudden onset of sharp left-sided chest pain radiating to the back and abdomen at 8 AM, bilateral leg numbness. Had MRI neck: Multilevel cervical disc degeneration with moderate spinal stenosis at C4-5, C5-6, and C6-7.  Severe right neural foraminal stenosis at C5-6. Moderate neural foraminal stenosis on the  left at C5-6 and C6-7. Referred to surgery in [place]. Scheduled to meet with them in 2 days on Thursday  Brace on her right foot for small ulceration, followed by Dr. Vickki Muff  denies any significant chest pain on today's visit, no shortness of breath Gait instability, no regular exercise program Stays in the house most of the time  EKG personally reviewed by myself on todays visit shows normal sinus rhythm with rate 60 bpm, LVH with nonspecific ST-T wave abnormality 1 and aVL, unchanged from previous EKG  Other past medical history reviewed CXR in ER: 01/08/16: mild CHF CBC nl, troponin neg, Creatinine 1.25, BUN 18, creatinine improved down from 1.4  Sleeps with CPAP, not working? Choking, takes sips of water, Uses in the day when napping,  Only uses it for 4 hours a day today  Waxing waning abdominal bloating, not severe "not bad today" Previously went abdominal bloating was severe she had fluid overload, pleural effusion Nausea last night Some chest pains, worse on exertion Anxious, h/a  Previous Echo 01/02/16 reviewed, normal EF, and RVSP Inferior wall HK   echocardiogram May 2016 showed normal ejection fraction, This was done in [place], no mention of right heart pressures. Prior echocardiogram at the end of 2015 done at [facility] documented ejection fraction 45%  CT scan images reviewed with her in detail. CT from 01/11/2015 showed mild to moderate sized pleural effusion on the right extending from base to apex in a supine position. Repeat chest x-ray at the end of September showed no significant effusion  Symptoms seem to present after she had colonoscopy, possibly had IV fluids during the procedure, symptoms improved on higher dose Lasix  She does report having pleural effusions in the past requiring thoracentesis, this was also documented in note by pulmonary No malignancy noted on previous evaluation  Cardiac catheterization from January 2013 documenting ejection fraction  35-40%, 40% mid LAD, 50% distal LAD, small diagonal with 70% disease, 50% proximal left circumflex, 75% distal left circumflex with patent stent, small RCA, calcified, severe atherosclerosis, 90% distal RCA  Details from catheterization March 2016 are not available, request made to hospital in [place] on today's visit for records   PMH:   has a past medical history of Acute respiratory failure ([place]) (11/21/2014); Anxiety; Arthritis (08/05/2013); Atherosclerotic peripheral vascular disease with gangrene [facility]) (august 2012); Cardiomyopathy, ischemic; Cerebral infarct ([place]) (08/17/2013); Chronic diastolic CHF (congestive heart failure) (HCC); COPD (chronic obstructive pulmonary disease) ([place]); Coronary artery disease, occlusive; 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  PSH:    Past Surgical History:  Procedure Laterality Date  . ABDOMINAL HYSTERECTOMY     at ge 39. secondary to bleeding/partial  . ANTERIOR CERVICAL DECOMP/DISCECTOMY FUSION N/A 10/10/2016   Procedure: Anterior Cervical Discectomy Fusion - Cervical four4- five - Cervical five-Cervical six - Cervical six-Cervical seven;  Surgeon: Earnie Larsson, MD;  Location: [facility];  Service: Neurosurgery;  Laterality: N/A;  . BREAST CYST ASPIRATION Right   . CARPAL TUNNEL RELEASE Bilateral   . CHOLECYSTECTOMY     in [place]   . COLONOSCOPY WITH PROPOFOL N/A 01/09/2015   Procedure: COLONOSCOPY WITH PROPOFOL;  Surgeon: Lucilla Lame, MD;  Location: [facility] ENDOSCOPY;  Service: Endoscopy;  Laterality: N/A;  .  CORONARY ANGIOPLASTY WITH STENT PLACEMENT     [place]; [facility]  . EYE SURGERY     bil cataracts  . HERNIA REPAIR     umbilical/ Dr Pat Patrick  . PTCA  August 2012   Right Posterior tibial artery , [place]  . 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 acetaminophen (TYLENOL) 500 MG tablet Take 1,000 mg by mouth every 6 (six) hours as needed for mild pain or headache.     . alendronate (FOSAMAX) 70 MG tablet Take 1 tablet (70 mg total) by mouth once a week. Pt takes on Sunday.   Take with a full glass of water on an empty stomach. 4 tablet 11  . ALPRAZolam (XANAX) 0.25 MG tablet Take 1 tablet (0.25 mg total) by mouth daily. 30 tablet 2  . aspirin EC 81 MG tablet Take 81 mg by mouth daily.    [name] BRILINTA 90 MG TABS tablet TAKE 1 TABLET TWICE A DAY 180 tablet 1  . budesonide-formoterol (SYMBICORT) 160-4.5 MCG/ACT inhaler Inhale 2 puffs into the lungs 2 (two) times daily. 1 Inhaler 0  . diclofenac sodium (VOLTAREN) 1 % GEL Apply 2 g topically 4 (four) times daily.    [name] docusate sodium (COLACE) 100 MG capsule Take 100 mg by mouth 2 (two) times daily as needed for mild constipation. Reported on 11/09/2015    .  fluticasone (FLONASE) 50 MCG/ACT nasal spray Place 2 sprays into both nostrils daily as needed for rhinitis.    . furosemide (LASIX) 20 MG tablet Take 1 tablet (20 mg total) by mouth daily. (Patient taking differently: Take 20 mg by mouth every other day. ) 90 tablet 1  . glucose blood (ACCU-CHEK AVIVA PLUS) test strip USE TO CHECK GLUCOSE  THREE TIMES DAILY 300 each 3  . HUMALOG MIX 75/25 KWIKPEN (75-25) 100 UNIT/ML Kwikpen INJECT 45 TO 60 UNITS TWICE DAILY BEFORE MORNING AND EVENING MEALS 105 mL 3  . HYDROcodone-acetaminophen (NORCO/VICODIN) 5-325 MG tablet Take 1-2 tablets by mouth every 4 (four) hours as needed (breakthrough pain). 30 tablet 0  . isosorbide mononitrate (IMDUR) 30 MG 24 hr  tablet TAKE 1 TABLET DAILY 90 tablet 1  . losartan (COZAAR) 100 MG tablet TAKE 1 TABLET DAILY 90 tablet 1  . LYRICA 100 MG capsule TAKE ONE CAPSULE BY MOUTH THREE TIMES DAILY 90 capsule 8  . metoprolol succinate (TOPROL-XL) 25 MG 24 hr tablet Take 1 tablet (25 mg total) by mouth daily. 90 tablet 3  . nitroGLYCERIN (NITROLINGUAL) 0.4 MG/SPRAY spray Place 1 spray under the tongue every 5 (five) minutes x 3 doses as needed for chest pain. 12 g 12  . ondansetron (ZOFRAN) 4 MG tablet Take 1 tablet (4 mg total) by mouth every 8 (eight) hours as needed for nausea or vomiting. 20 tablet 0  . PARoxetine (PAXIL) 20 MG tablet TAKE ONE TABLET BY MOUTH ONCE DAILY 90 tablet 1  . polyethylene glycol (MIRALAX / GLYCOLAX) packet Take 17 g by mouth daily as needed for mild constipation. Reported on 10/25/2015    . rosuvastatin (CRESTOR) 20 MG tablet Take 20 mg by mouth daily.     . traMADol (ULTRAM) 50 MG tablet Take 1 tablet (50 mg total) by mouth every 6 (six) hours as needed for moderate pain. 30 tablet 0   No current facility-administered medications for this visit.    Facility-Administered Medications Ordered in Other Visits  Medication Dose Route Frequency Provider Last Rate Last Dose  . albuterol (PROVENTIL) (5 MG/ML) 0.5% nebulizer solution 2.5 mg  2.5 mg Nebulization Once Pamela Mc, MD         Allergies:   Cephalexin; Contrast media [iodinated diagnostic agents]; Doxycycline; Gabapentin; and Sulfa antibiotics   Social History:  The patient  reports that she has never smoked. She has never used smokeless tobacco. She reports that she does not drink alcohol or use drugs.   Family History:   family history includes Asthma in her grandchild; Breast cancer in her maternal aunt; Heart attack in her brother, father, and sister; Heart disease in her brother, father, and sister.    Review of Systems: Review of Systems  Constitutional: Negative.   Respiratory: Positive for shortness of breath.    Cardiovascular: Negative.   Gastrointestinal: Negative.   Musculoskeletal: Negative.   Psychiatric/Behavioral: Negative.   All other systems reviewed and are negative.    PHYSICAL EXAM: VS:  BP 128/62 (BP Location: Left Arm, Patient Position: Sitting, Cuff Size: Normal)   Pulse 66   Ht 5\' 7"  (1.702 m)   Wt 189 lb 8 oz (86 kg)   BMI 29.68 kg/m  , BMI Body mass index is 29.68 kg/m. GEN: Well nourished, well developed, in no acute distress, Gait instability  HEENT: normal  Neck: no JVD, carotid bruits, or masses Cardiac: RRR; no murmurs, rubs, or gallops,no edema  Respiratory:  clear to auscultation  bilaterally, normal work of breathing GI: soft, nontender, nondistended, + BS MS: no deformity or atrophy  Skin: warm and dry, no rash Neuro:  Strength and sensation are intact Psych: euthymic mood, full affect    Recent Labs: 05/16/2016: Pro B Natriuretic peptide (BNP) 300.0 06/29/2016: ALT 14 11/14/2016: Hemoglobin 11.0; Platelets 320 11/15/2016: TSH 0.953 12/22/2016: Brain Natriuretic Peptide 71.8; BUN 37; Creatinine, Ser 1.63; Potassium 4.0; Sodium 135    Lipid Panel Lab Results  Component Value Date   CHOL 124 11/15/2016   HDL 29 (L) 11/15/2016   LDLCALC 60 11/15/2016   TRIG 175 (H) 11/15/2016      Wt Readings from Last 3 Encounters:  02/26/17 189 lb 8 oz (86 kg)  12/22/16 193 lb 3.2 oz (87.6 kg)  11/27/16 187 lb (84.8 kg)       ASSESSMENT AND PLAN:   Angina pectoris (HCC) -  Recent stress test with no ischemia Echocardiogram with normal ejection fraction With no further workup needed at this time  Chest pain, unspecified chest pain type -  Long history of periodic chest pain, No further workup as detailed above  SOB (shortness of breath) -  On Lasix every other day, appears euvolemic Shortness of breath on exertion likely from deconditioning History of pulmonary sarcoid  Type 2 diabetes We have encouraged continued exercise, careful diet management in  an effort to lose weight.    Total encounter time more than 25 minutes  Greater than 50% was spent in counseling and coordination of care with the patient   Disposition:   F/U  12 months   Orders Placed This Encounter  Procedures  . EKG 12-Lead     Signed, Esmond Plants, M.D., Ph.D. 02/26/2017  [place], [place]

## 2017-02-26 ENCOUNTER — Ambulatory Visit: Payer: Medicare Other | Admitting: Cardiovascular Disease

## 2017-02-26 ENCOUNTER — Ambulatory Visit (INDEPENDENT_AMBULATORY_CARE_PROVIDER_SITE_OTHER): Payer: Medicare Other | Admitting: Cardiovascular Disease

## 2017-02-26 ENCOUNTER — Encounter: Payer: Self-pay | Admitting: Cardiovascular Disease

## 2017-02-26 VITALS — BP 128/62 | HR 66 | Ht 67.0 in | Wt 189.5 lb

## 2017-02-26 DIAGNOSIS — I255 Ischemic cardiomyopathy: Secondary | ICD-10-CM

## 2017-02-26 DIAGNOSIS — N183 Chronic kidney disease, stage 3 (moderate): Secondary | ICD-10-CM | POA: Diagnosis not present

## 2017-02-26 DIAGNOSIS — Z23 Encounter for immunization: Secondary | ICD-10-CM

## 2017-02-26 DIAGNOSIS — E782 Mixed hyperlipidemia: Secondary | ICD-10-CM

## 2017-02-26 DIAGNOSIS — I10 Essential (primary) hypertension: Secondary | ICD-10-CM

## 2017-02-26 DIAGNOSIS — I208 Other forms of angina pectoris: Secondary | ICD-10-CM

## 2017-02-26 DIAGNOSIS — I129 Hypertensive chronic kidney disease with stage 1 through stage 4 chronic kidney disease, or unspecified chronic kidney disease: Secondary | ICD-10-CM

## 2017-02-26 DIAGNOSIS — E1129 Type 2 diabetes mellitus with other diabetic kidney complication: Secondary | ICD-10-CM

## 2017-02-26 DIAGNOSIS — E1351 Other specified diabetes mellitus with diabetic peripheral angiopathy without gangrene: Secondary | ICD-10-CM | POA: Diagnosis not present

## 2017-02-26 DIAGNOSIS — E1122 Type 2 diabetes mellitus with diabetic chronic kidney disease: Secondary | ICD-10-CM

## 2017-02-26 DIAGNOSIS — IMO0002 Reserved for concepts with insufficient information to code with codable children: Secondary | ICD-10-CM

## 2017-02-26 DIAGNOSIS — I209 Angina pectoris, unspecified: Secondary | ICD-10-CM

## 2017-02-26 DIAGNOSIS — E1165 Type 2 diabetes mellitus with hyperglycemia: Secondary | ICD-10-CM | POA: Diagnosis not present

## 2017-02-26 DIAGNOSIS — I5032 Chronic diastolic (congestive) heart failure: Secondary | ICD-10-CM | POA: Diagnosis not present

## 2017-02-26 NOTE — Patient Instructions (Addendum)
FLU shot  Medication Instructions:   Look for the generic of the Miralex, for stomach, constipation  Labwork:  No new labs needed  Testing/Procedures:  No further testing at this time   Follow-Up: It was a pleasure seeing you in the office today. Please call us if you have new issues that need to be addressed before your next appt.  [phone]  Your physician wants you to follow-up in: 12 months.  You will receive a reminder letter in the mail two months in advance. If you don't receive a letter, please call our office to schedule the follow-up appointment.  If you need a refill on your cardiac medications before your next appointment, please call your pharmacy.

## 2017-02-27 ENCOUNTER — Telehealth: Payer: Self-pay | Admitting: Internal Medicine

## 2017-02-27 DIAGNOSIS — I255 Ischemic cardiomyopathy: Secondary | ICD-10-CM

## 2017-02-27 MED ORDER — BRILINTA 90 MG PO TABS: 90.0000 mg | ORAL_TABLET | Freq: Two times a day (BID) | ORAL | 0 refills | Status: DC

## 2017-02-27 NOTE — Telephone Encounter (Signed)
Pt stated that she gets this rx through her mail order but that she would need some before she would receive it in the mail. Explained to the pt that we sent in a 7 day supply to her local pharmacy and then sent a 90 day supply to the mail order pharmacy. Pt gave a verbal understanding.

## 2017-02-27 NOTE — Telephone Encounter (Signed)
Pt called requesting a refill on her BRILINTA 90 MG TABS tablet. She only has a couple of pill left. Please advise, thank you!  [place] [place] [facility] [place]  Call pt @ [phone]

## 2017-03-05 ENCOUNTER — Other Ambulatory Visit: Payer: Self-pay

## 2017-03-05 DIAGNOSIS — I255 Ischemic cardiomyopathy: Secondary | ICD-10-CM

## 2017-03-05 MED ORDER — BRILINTA 90 MG PO TABS: 90.0000 mg | ORAL_TABLET | Freq: Two times a day (BID) | ORAL | 0 refills | Status: DC

## 2017-03-08 ENCOUNTER — Other Ambulatory Visit: Payer: Self-pay | Admitting: Internal Medicine

## 2017-03-27 ENCOUNTER — Ambulatory Visit: Payer: Medicare Other | Admitting: Cardiovascular Disease

## 2017-04-01 ENCOUNTER — Other Ambulatory Visit: Payer: Self-pay | Admitting: Internal Medicine

## 2017-04-07 DIAGNOSIS — L97511 Non-pressure chronic ulcer of other part of right foot limited to breakdown of skin: Secondary | ICD-10-CM | POA: Diagnosis not present

## 2017-04-07 DIAGNOSIS — E1042 Type 1 diabetes mellitus with diabetic polyneuropathy: Secondary | ICD-10-CM | POA: Diagnosis not present

## 2017-04-07 DIAGNOSIS — L851 Acquired keratosis [keratoderma] palmaris et plantaris: Secondary | ICD-10-CM | POA: Diagnosis not present

## 2017-04-07 DIAGNOSIS — B351 Tinea unguium: Secondary | ICD-10-CM | POA: Diagnosis not present

## 2017-05-05 IMAGING — CT CT ANGIO CHEST
1 of 2 series · 18 of 30 positions shown · IV contrast (APPLIED)
Comparison: 01/24/2014 an abdominal CT 11/02/2014 and 05/06/2012

CLINICAL DATA: Shortness of breath. Dyspnea. History of
sarcoidosis.

EXAM:
CT ANGIOGRAPHY CHEST WITH CONTRAST
TECHNIQUE: Multidetector CT imaging of the chest was performed using the
standard protocol during bolus administration of intravenous
contrast. Multiplanar CT image reconstructions and MIPs were
obtained to evaluate the vascular anatomy.
CONTRAST:  75mL OMNIPAQUE IOHEXOL 350 MG/ML SOLN

[Series 5: pe 1.0 thins · axial · 0.68mm/px · z∈[-454,-202]mm · 18 of 283 slices shown]
[im 16/283  lung]
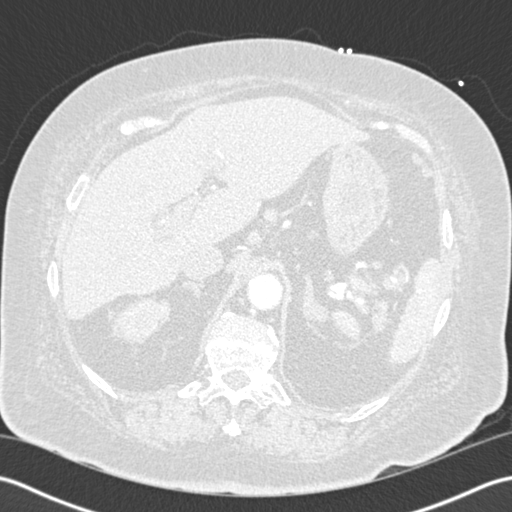
[im 32/283  mediastinal]
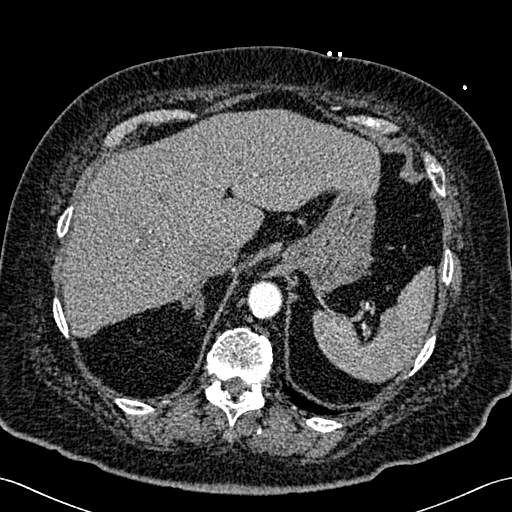
[im 48/283  lung]
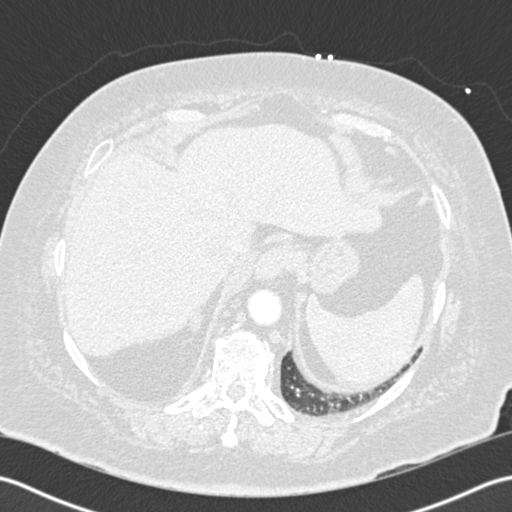
[im 63/283  mediastinal]
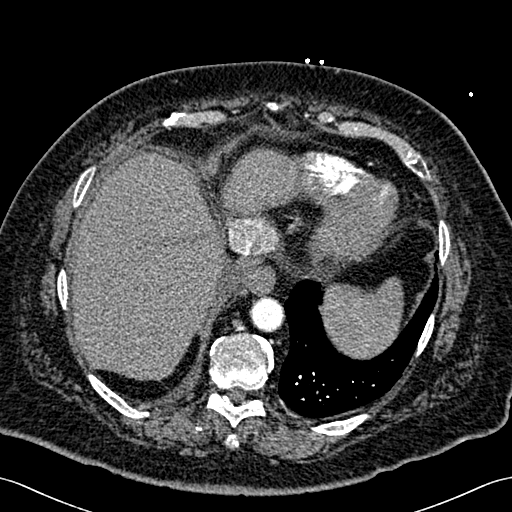
[im 79/283  lung]
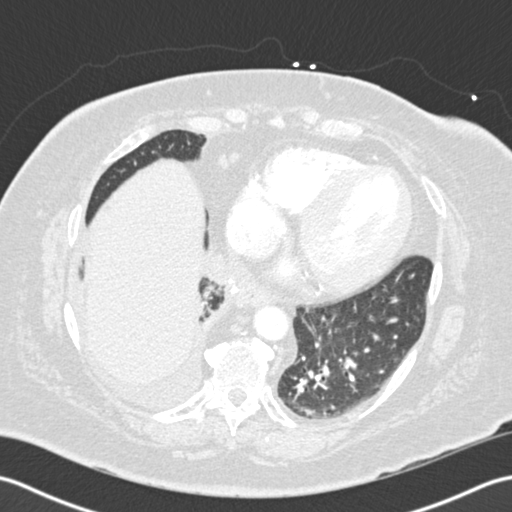
[im 95/283  mediastinal]
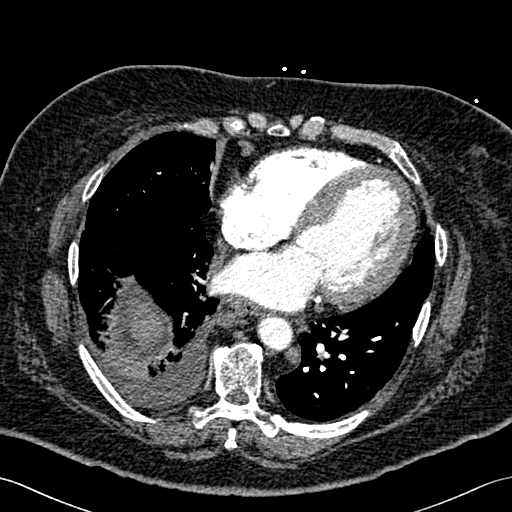
[im 110/283  lung]
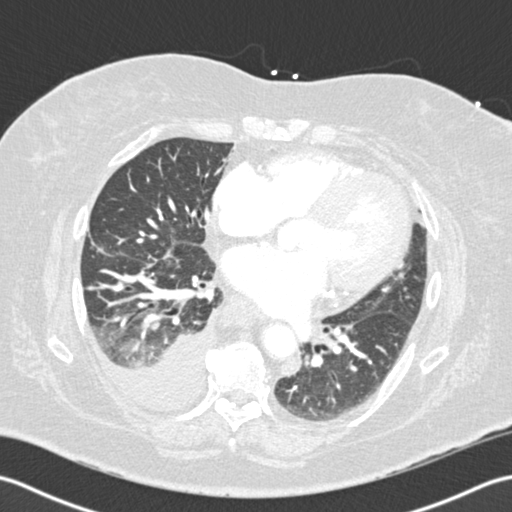
[im 126/283  mediastinal]
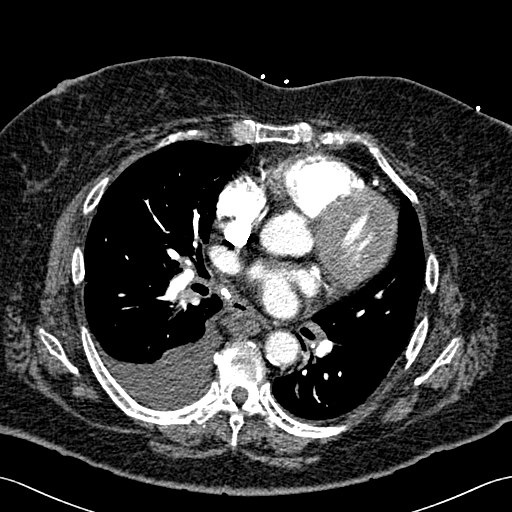
[im 132/283  lung]
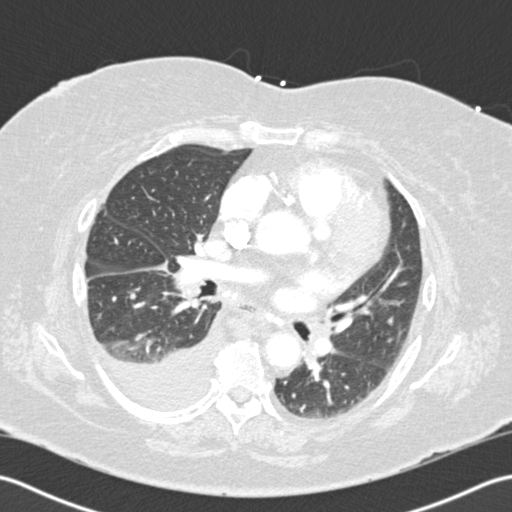
[im 142/283  mediastinal]
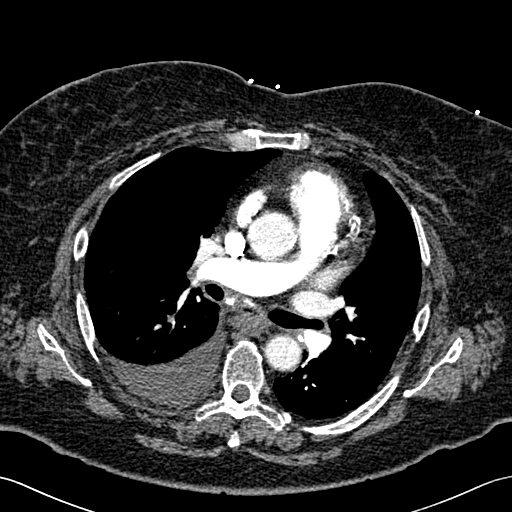
[im 157/283  lung]
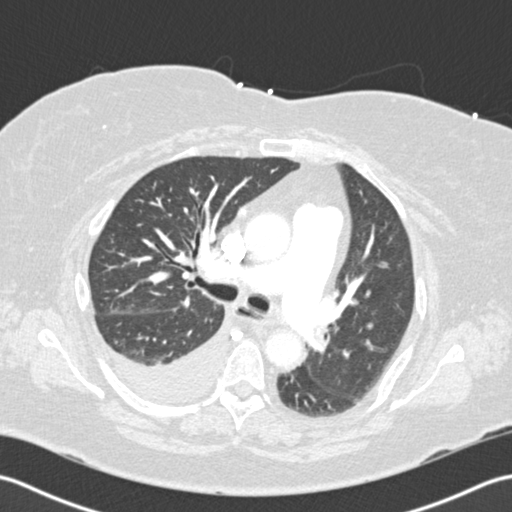
[im 173/283  mediastinal]
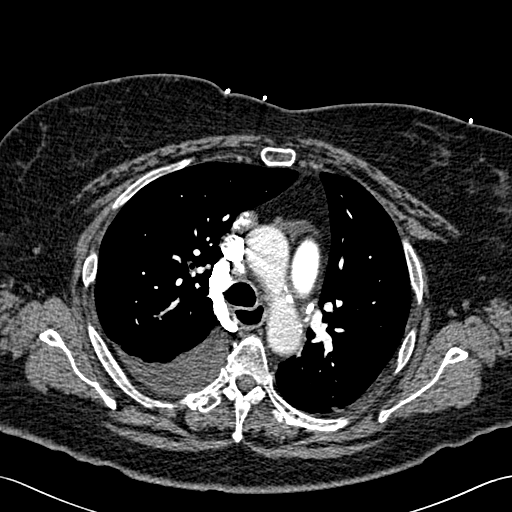
[im 189/283  lung]
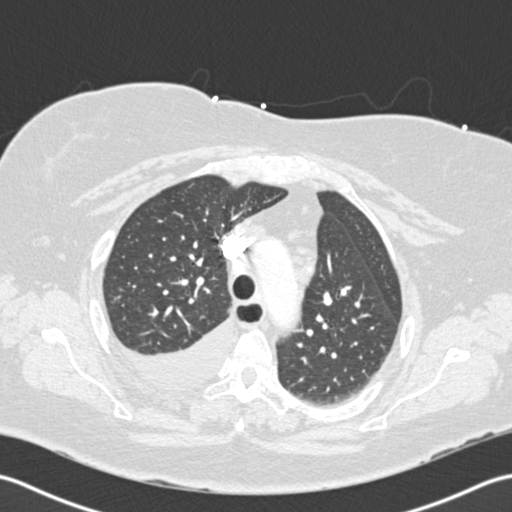
[im 204/283  mediastinal]
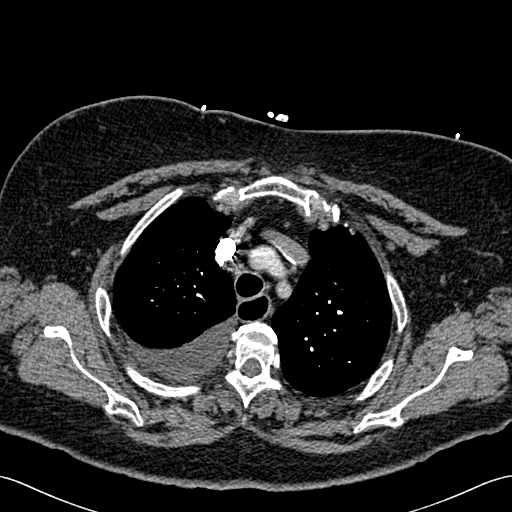
[im 220/283  lung]
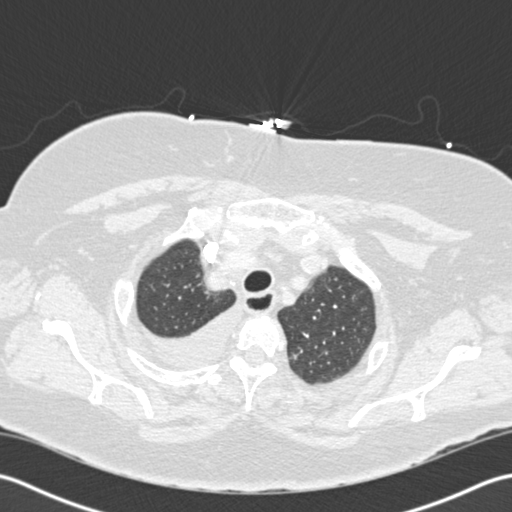
[im 236/283  mediastinal]
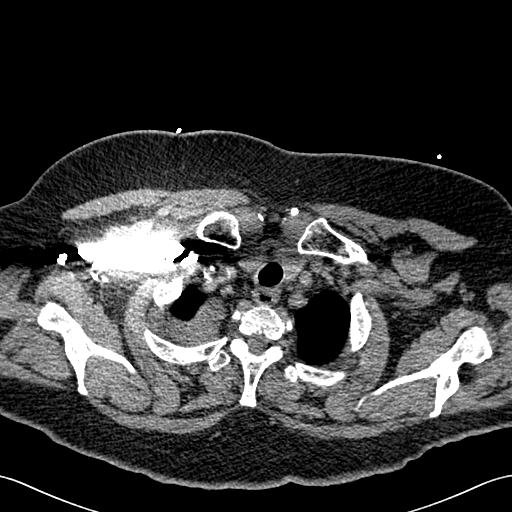
[im 251/283  lung]
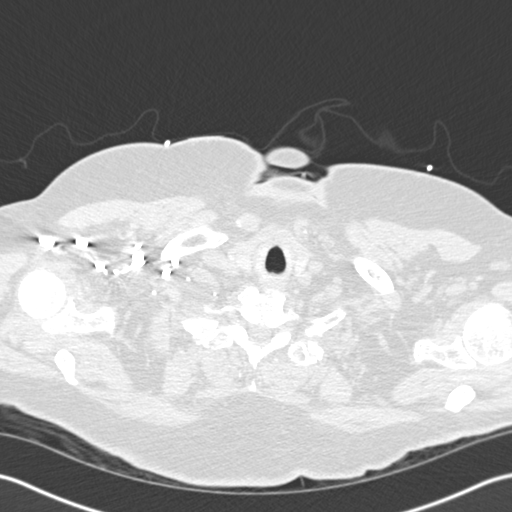
[im 267/283  mediastinal]
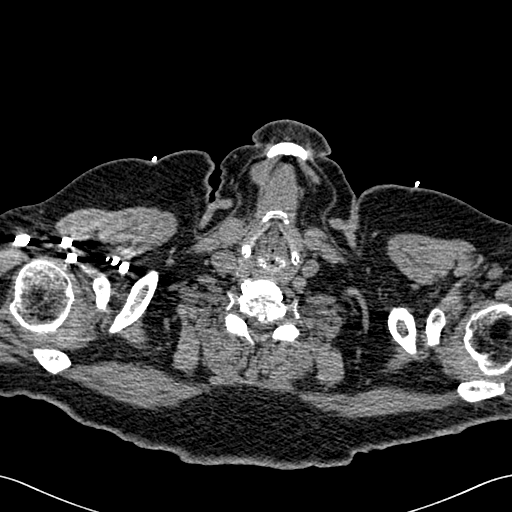

[18 of 30 positions shown; findings below may reference images not displayed]

FINDINGS: Negative for pulmonary embolism. Small to moderate sized right
pleural effusion. Chronic fullness in the adrenal glands may
represent hyperplasia. Prominent lymph nodes in the gastrohepatic
ligament are again noted. Index lesion measures 1.3 cm on sequence
4, image 133. Multiple small lymph nodes around the descending
thoracic aorta. No significant change in the lymphadenopathy around
the descending thoracic aorta. Again noted are multiple
calcifications throughout the mediastinum compatible with old
granulomatous disease. There is a prominent lymph node in the right
cardiophrenic fat measuring up to 1.3 cm. Small amount of
pericardial fluid. Multiple calcifications in the hilar regions. No
significant axillary lymphadenopathy.

The trachea and mainstem bronchi are patent. Mild peribronchial
thickening in the hilar regions is similar to the prior examination
and may be related to old granulomatous disease. There is volume
loss in the right lower lobe. Again noted is a nodular lesion in the
medial right upper lobe on sequence 6, image 59 measuring 1.3 cm and
stable. This has minimally changed since 05/06/2012. Small nodular
structures in the superior segment of the right lower lobe, largest
measuring 3 mm on sequence 6, image 59. There is a 5 mm nodule in
the right lower lobe on image 77. This nodule may have measured 3 mm
on 03/15/2013. Accessory fissure in the left upper lung. Punctate
nodule in the left upper lobe on image 31. Chronic nodularity at the
base of the lingula on image [AGE] left posterior rib fractures.

Review of the MIP images confirms the above findings.
IMPRESSION: Negative for pulmonary embolism.

Small-to-moderate sized right pleural effusion. Volume loss in the
right lower lobe with ground-glass densities probably associated
with atelectasis.

Small nodular structures in the right lower lobe. These nodules may
be chronic but at least one nodule has demonstrated interval growth,
now measuring 5 mm. These small nodules could be postinflammatory
but indeterminate. If the patient is at high risk for bronchogenic
carcinoma, follow-up chest CT at 6-12 months is recommended. If the
patient is at low risk for bronchogenic carcinoma, follow-up chest
CT at 12 months is recommended. This recommendation follows the
consensus statement: Guidelines for Management of Small Pulmonary
Nodules Detected on CT Scans: A Statement from the [employer]

Prominent lymph nodes in the upper abdomen and posterior
mediastinum. These lymph nodes are probably related to history of
sarcoidosis.

Stable 1.3 cm nodule in the medial right upper lobe.

## 2017-05-05 IMAGING — CR DG CHEST 2V
1 series · 2 of 2 positions shown · non-contrast
Comparison: October 12, 2014

CLINICAL DATA: Shortness of Breath

EXAM:
CHEST  2 VIEW

[Series 1: dg chest 2 view · 0.14mm/px · 2 of 2 slices shown]
[im 1/2]
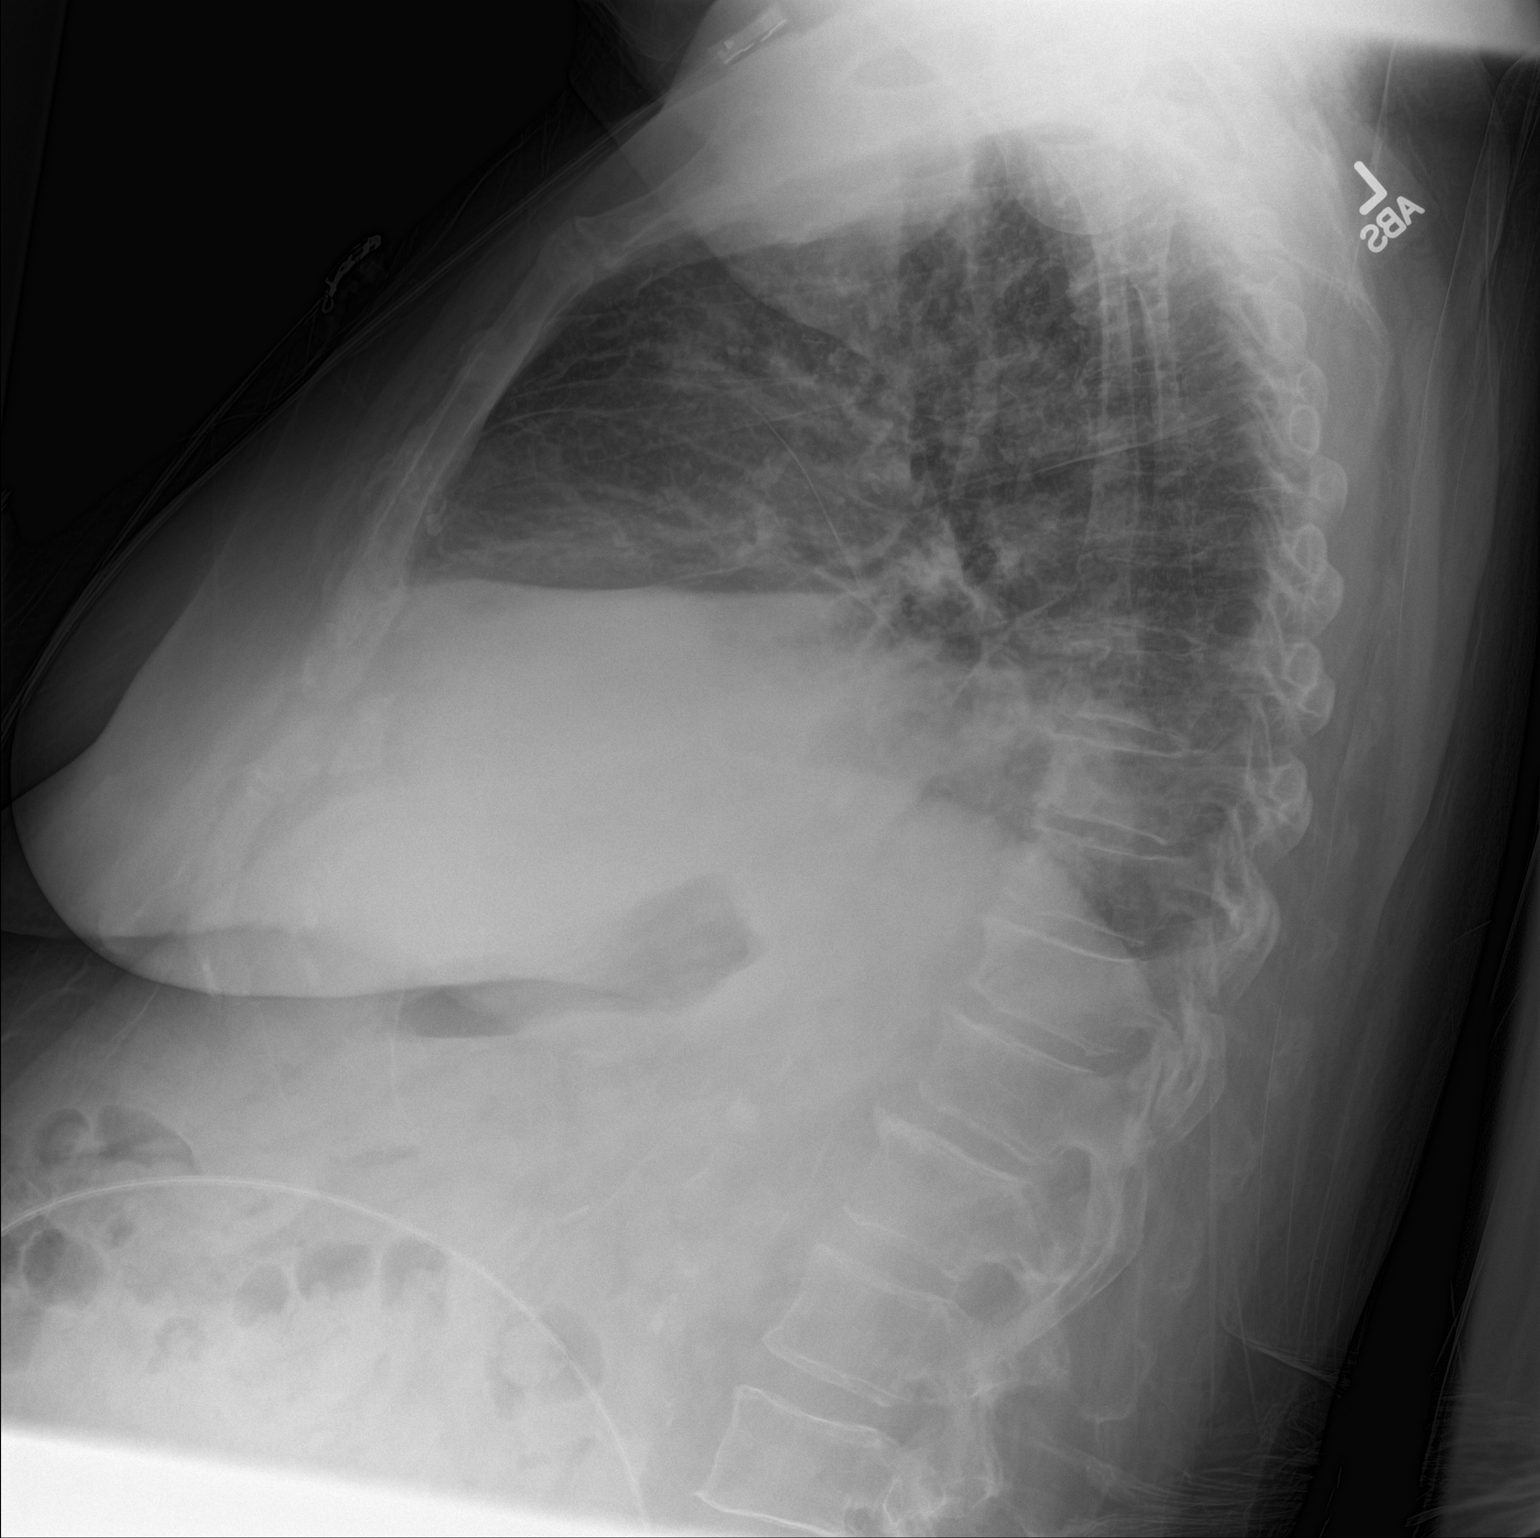
[im 2/2]
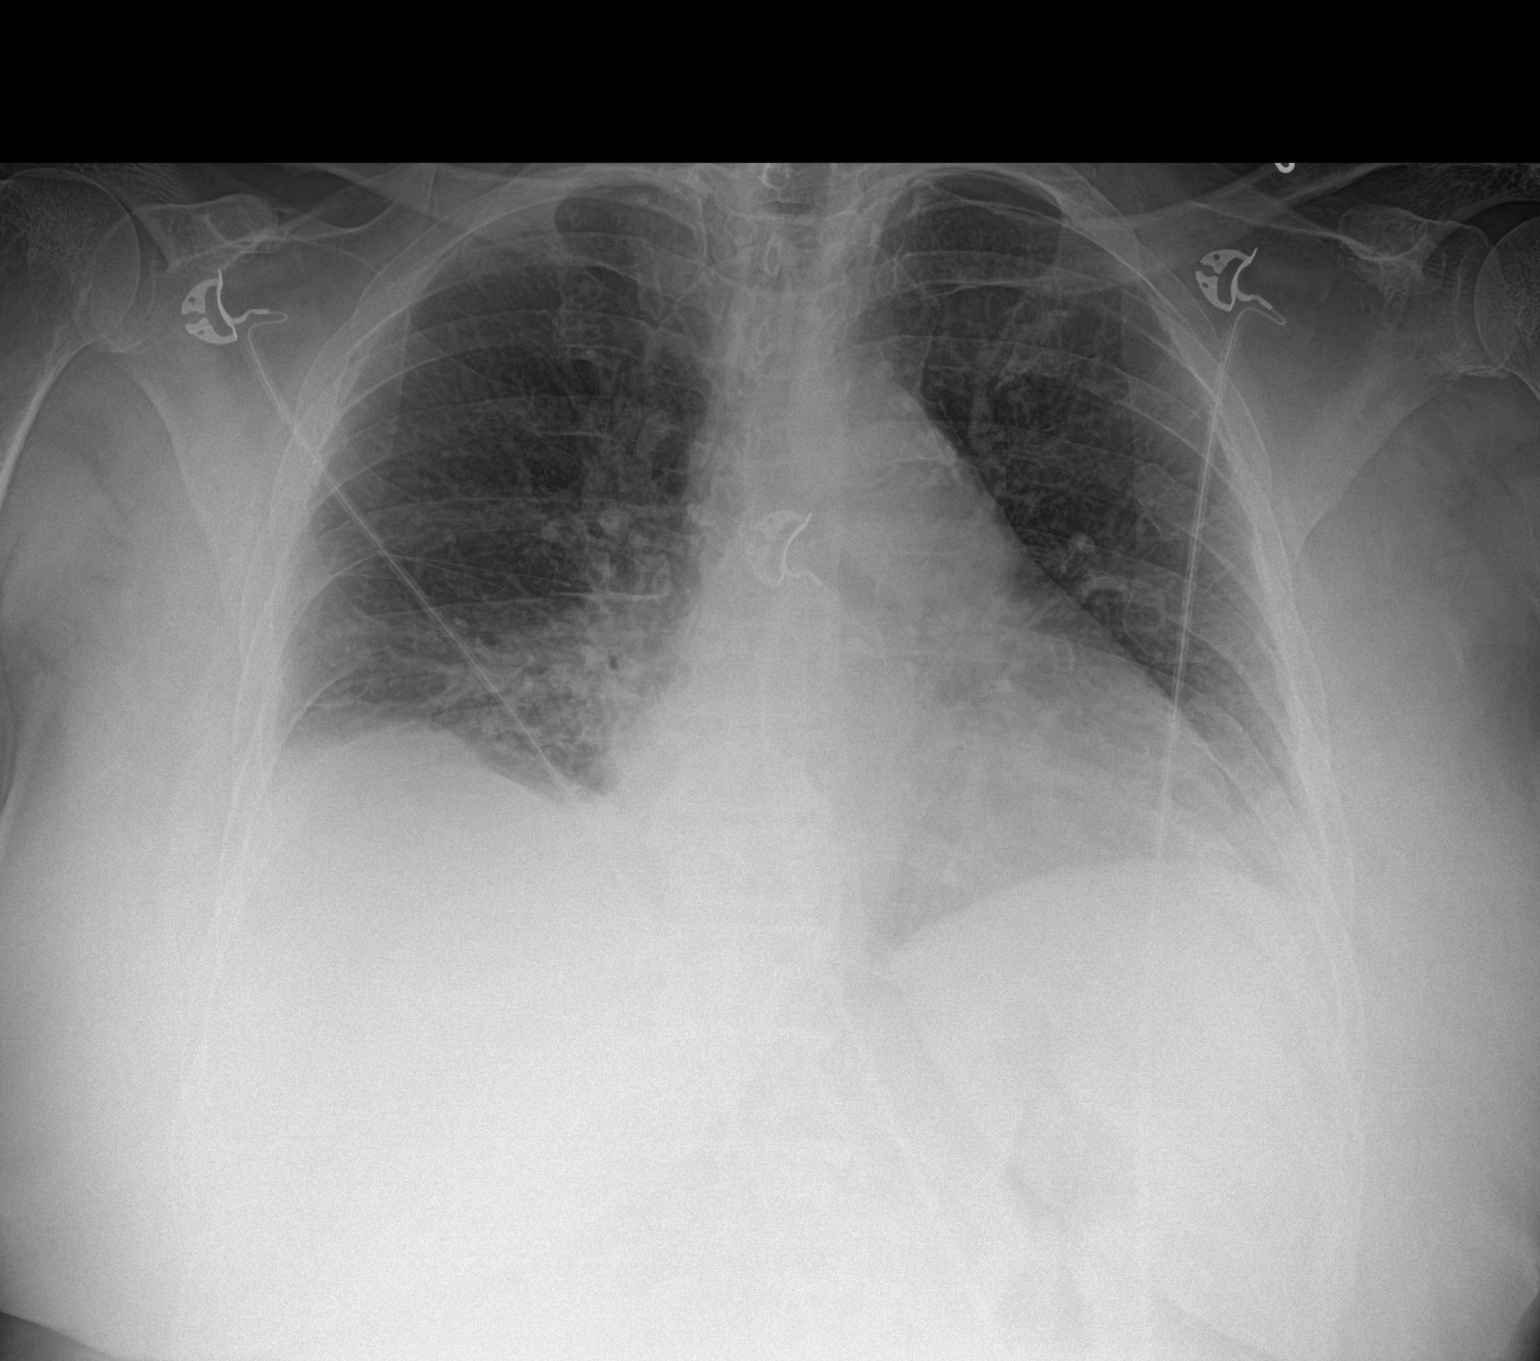

[2 of 2 positions shown; findings below may reference images not displayed]

FINDINGS: There is atelectatic change in the right base. There is no edema or
consolidation. Heart is upper normal in size with pulmonary
vascularity within normal limits. There is atherosclerotic change in
the aorta. No adenopathy. No bone lesions are appreciable.
Incidental note is made of an old healed fracture of the left
posterior fifth rib. There are scattered calcified granulomas in the
left aortopulmonary window region.
IMPRESSION: Atelectasis right base. No edema or consolidation. Evidence of prior
granulomatous disease.

## 2017-05-06 IMAGING — US US CHEST/MEDIASTINUM
1 series · 1 of 1 positions shown · non-contrast
Comparison: CT chest 01/11/2015

CLINICAL DATA: Pleural effusion

EXAM:
CHEST ULTRASOUND

[Series 1: us chest/mediastinum · 0.24mm/px · 1 of 1 slices shown]
[im 1/1]
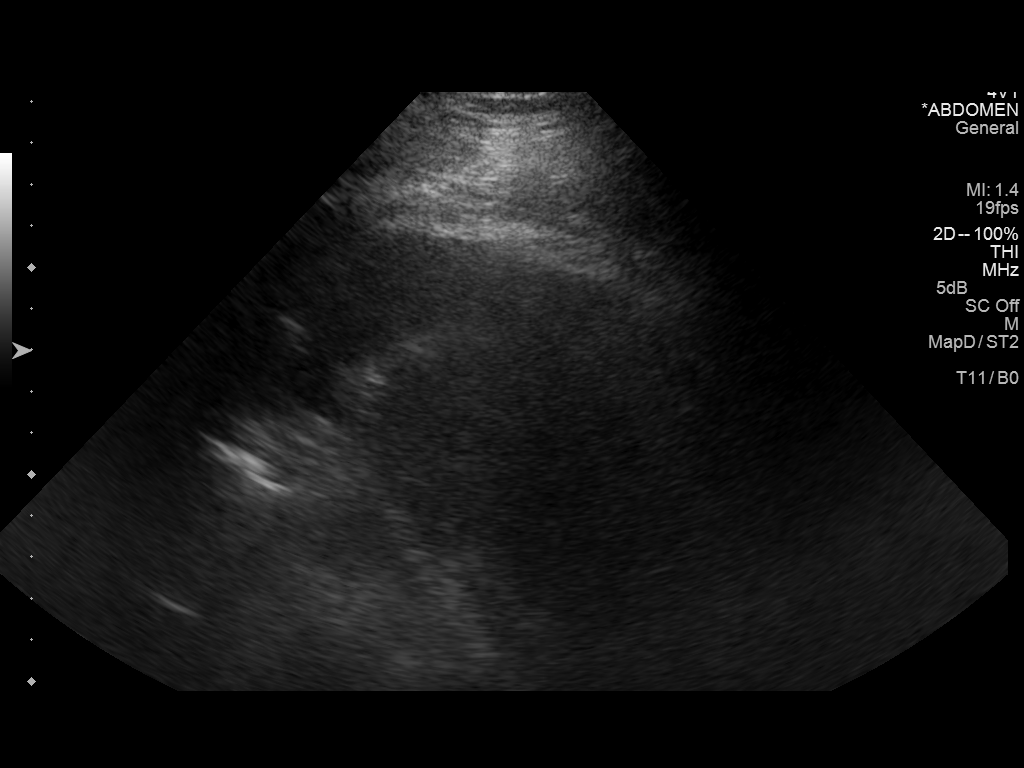

[1 of 1 positions shown; findings below may reference images not displayed]

FINDINGS: Sonography of the RIGHT pleural space performed.

Small amount RIGHT pleural effusion is identified but this appears
insufficient for thoracentesis.
IMPRESSION: Insufficient RIGHT pleural effusion for thoracentesis.

## 2017-05-20 ENCOUNTER — Telehealth: Payer: Self-pay | Admitting: Cardiovascular Disease

## 2017-05-20 NOTE — Telephone Encounter (Signed)
Patient reports that she had some chest pain yesterday. She states that she was taking her husband over to [facility] for eye surgery and she was very stressed about driving there. She states that she took 1 spray of nitro and her pain went away and she has not had any since then but has a terrible headache today. She said that when she laid down last night she also had some shortness of breath but that it did get better once she laid there for a while. She felt it was from the stress due to her having to drive him there. Reviewed signs and symptoms to monitor for which would require follow up in the ED and that I would route message to Dr. Rockey Situ for review and that I would call her back if any further recommendations. Reviewed use of nitroglycerin and she verbalized understanding. She was appreciative for the call with no further concerns at this time.

## 2017-05-20 NOTE — Telephone Encounter (Signed)
Pt c/o of Chest Pain: STAT if CP now or developed within 24 hours  1. Are you having CP right now?  No   2. Are you experiencing any other symptoms (ex. SOB, nausea, vomiting, sweating)? Headache pain in ear night sweating (damp clothing at night)   3. How long have you been experiencing CP? 2 days   4. Is your CP continuous or coming and going? Comes and goes   5. Have you taken Nitroglycerin? Yes  ? Not sure what to do if this happens again should she be seen ?  Please call

## 2017-05-21 NOTE — Telephone Encounter (Signed)
No answer. Phone rang several times and no answering machine picked up.

## 2017-05-21 NOTE — Telephone Encounter (Signed)
I spoke with the patient. She has had no further symptoms of chest pain. I have advised her of Dr. Donivan Scull recommendations. She verbalizes understanding.

## 2017-05-21 NOTE — Telephone Encounter (Signed)
Would monitor for now As you detailed likely stress She can call if she has further epsiodes

## 2017-05-23 ENCOUNTER — Other Ambulatory Visit: Payer: Self-pay

## 2017-05-23 ENCOUNTER — Encounter: Payer: Self-pay | Admitting: Emergency Medicine

## 2017-05-23 ENCOUNTER — Emergency Department: Payer: Medicare Other

## 2017-05-23 ENCOUNTER — Inpatient Hospital Stay
Admission: EM | Admit: 2017-05-23 | Discharge: 2017-05-26 | DRG: 194 | Disposition: A | Discharge status: Home Health | Payer: Medicare Other | Attending: Internal Medicine | Admitting: Internal Medicine

## 2017-05-23 DIAGNOSIS — Z955 Presence of coronary angioplasty implant and graft: Secondary | ICD-10-CM

## 2017-05-23 DIAGNOSIS — Z8249 Family history of ischemic heart disease and other diseases of the circulatory system: Secondary | ICD-10-CM

## 2017-05-23 DIAGNOSIS — Z8673 Personal history of transient ischemic attack (TIA), and cerebral infarction without residual deficits: Secondary | ICD-10-CM

## 2017-05-23 DIAGNOSIS — J440 Chronic obstructive pulmonary disease with acute lower respiratory infection: Secondary | ICD-10-CM | POA: Diagnosis not present

## 2017-05-23 DIAGNOSIS — J181 Lobar pneumonia, unspecified organism: Secondary | ICD-10-CM

## 2017-05-23 DIAGNOSIS — J189 Pneumonia, unspecified organism: Principal | ICD-10-CM | POA: Diagnosis present

## 2017-05-23 DIAGNOSIS — Z794 Long term (current) use of insulin: Secondary | ICD-10-CM

## 2017-05-23 DIAGNOSIS — Z90710 Acquired absence of both cervix and uterus: Secondary | ICD-10-CM

## 2017-05-23 DIAGNOSIS — Z7983 Long term (current) use of bisphosphonates: Secondary | ICD-10-CM

## 2017-05-23 DIAGNOSIS — R0789 Other chest pain: Secondary | ICD-10-CM | POA: Diagnosis not present

## 2017-05-23 DIAGNOSIS — Z7951 Long term (current) use of inhaled steroids: Secondary | ICD-10-CM

## 2017-05-23 DIAGNOSIS — Z7902 Long term (current) use of antithrombotics/antiplatelets: Secondary | ICD-10-CM

## 2017-05-23 DIAGNOSIS — I5032 Chronic diastolic (congestive) heart failure: Secondary | ICD-10-CM | POA: Diagnosis not present

## 2017-05-23 DIAGNOSIS — E1151 Type 2 diabetes mellitus with diabetic peripheral angiopathy without gangrene: Secondary | ICD-10-CM | POA: Diagnosis present

## 2017-05-23 DIAGNOSIS — I130 Hypertensive heart and chronic kidney disease with heart failure and stage 1 through stage 4 chronic kidney disease, or unspecified chronic kidney disease: Secondary | ICD-10-CM | POA: Diagnosis present

## 2017-05-23 DIAGNOSIS — M810 Age-related osteoporosis without current pathological fracture: Secondary | ICD-10-CM | POA: Diagnosis present

## 2017-05-23 DIAGNOSIS — I255 Ischemic cardiomyopathy: Secondary | ICD-10-CM | POA: Diagnosis present

## 2017-05-23 DIAGNOSIS — G4733 Obstructive sleep apnea (adult) (pediatric): Secondary | ICD-10-CM

## 2017-05-23 DIAGNOSIS — T50905A Adverse effect of unspecified drugs, medicaments and biological substances, initial encounter: Secondary | ICD-10-CM | POA: Diagnosis present

## 2017-05-23 DIAGNOSIS — K208 Other esophagitis without bleeding: Secondary | ICD-10-CM

## 2017-05-23 DIAGNOSIS — Z89421 Acquired absence of other right toe(s): Secondary | ICD-10-CM

## 2017-05-23 DIAGNOSIS — R140 Abdominal distension (gaseous): Secondary | ICD-10-CM | POA: Diagnosis not present

## 2017-05-23 DIAGNOSIS — K5730 Diverticulosis of large intestine without perforation or abscess without bleeding: Secondary | ICD-10-CM | POA: Diagnosis not present

## 2017-05-23 DIAGNOSIS — Z7982 Long term (current) use of aspirin: Secondary | ICD-10-CM

## 2017-05-23 DIAGNOSIS — Z79899 Other long term (current) drug therapy: Secondary | ICD-10-CM

## 2017-05-23 DIAGNOSIS — Z9989 Dependence on other enabling machines and devices: Secondary | ICD-10-CM

## 2017-05-23 DIAGNOSIS — J90 Pleural effusion, not elsewhere classified: Secondary | ICD-10-CM | POA: Diagnosis not present

## 2017-05-23 DIAGNOSIS — R079 Chest pain, unspecified: Secondary | ICD-10-CM | POA: Diagnosis not present

## 2017-05-23 DIAGNOSIS — E1165 Type 2 diabetes mellitus with hyperglycemia: Secondary | ICD-10-CM | POA: Diagnosis present

## 2017-05-23 DIAGNOSIS — E785 Hyperlipidemia, unspecified: Secondary | ICD-10-CM | POA: Diagnosis present

## 2017-05-23 DIAGNOSIS — E1122 Type 2 diabetes mellitus with diabetic chronic kidney disease: Secondary | ICD-10-CM | POA: Diagnosis present

## 2017-05-23 DIAGNOSIS — I10 Essential (primary) hypertension: Secondary | ICD-10-CM | POA: Diagnosis not present

## 2017-05-23 DIAGNOSIS — F418 Other specified anxiety disorders: Secondary | ICD-10-CM | POA: Diagnosis present

## 2017-05-23 DIAGNOSIS — Z9049 Acquired absence of other specified parts of digestive tract: Secondary | ICD-10-CM

## 2017-05-23 DIAGNOSIS — N183 Chronic kidney disease, stage 3 (moderate): Secondary | ICD-10-CM | POA: Diagnosis present

## 2017-05-23 DIAGNOSIS — I2510 Atherosclerotic heart disease of native coronary artery without angina pectoris: Secondary | ICD-10-CM | POA: Diagnosis present

## 2017-05-23 DIAGNOSIS — E119 Type 2 diabetes mellitus without complications: Secondary | ICD-10-CM | POA: Diagnosis not present

## 2017-05-23 LAB — BASIC METABOLIC PANEL
ANION GAP: 8 (ref 5–15)
BUN: 22 mg/dL — AB (ref 6–20)
CHLORIDE: 104 mmol/L (ref 101–111)
CO2: 27 mmol/L (ref 22–32)
Calcium: 9.3 mg/dL (ref 8.9–10.3)
Creatinine, Ser: 1.25 mg/dL — ABNORMAL HIGH (ref 0.44–1.00)
GFR calc Af Amer: 47 mL/min — ABNORMAL LOW (ref >60)
GFR, EST NON AFRICAN AMERICAN: 40 mL/min — AB (ref >60)
Glucose, Bld: 176 mg/dL — ABNORMAL HIGH (ref 65–99)
POTASSIUM: 3.7 mmol/L (ref 3.5–5.1)
SODIUM: 139 mmol/L (ref 135–145)

## 2017-05-23 LAB — HEPATIC FUNCTION PANEL
ALBUMIN: 3.4 g/dL — AB (ref 3.5–5.0)
ALK PHOS: 59 U/L (ref 38–126)
ALT: 14 U/L (ref 14–54)
AST: 24 U/L (ref 15–41)
Bilirubin, Direct: <0.1 mg/dL — ABNORMAL LOW (ref 0.1–0.5)
TOTAL PROTEIN: 7.7 g/dL (ref 6.5–8.1)
Total Bilirubin: 0.8 mg/dL (ref 0.3–1.2)

## 2017-05-23 LAB — CBC
HEMATOCRIT: 36.7 % (ref 35.0–47.0)
HEMOGLOBIN: 12.1 g/dL (ref 12.0–16.0)
MCH: 28.9 pg (ref 26.0–34.0)
MCHC: 33.1 g/dL (ref 32.0–36.0)
MCV: 87.4 fL (ref 80.0–100.0)
Platelets: 313 10*3/uL (ref 150–440)
RBC: 4.2 MIL/uL (ref 3.80–5.20)
RDW: 16.2 % — AB (ref 11.5–14.5)
WBC: 6.4 10*3/uL (ref 3.6–11.0)

## 2017-05-23 LAB — LIPASE, BLOOD: Lipase: 29 U/L (ref 11–51)

## 2017-05-23 LAB — TROPONIN I
Troponin I: <0.03 ng/mL (ref <0.03)
Troponin I: <0.03 ng/mL (ref <0.03)

## 2017-05-23 LAB — GLUCOSE, CAPILLARY: Glucose-Capillary: 122 mg/dL — ABNORMAL HIGH (ref 65–99)

## 2017-05-23 MED ORDER — LIDOCAINE VISCOUS 2 % MT SOLN
15.0000 mL | Freq: Once | OROMUCOSAL | Status: AC
  Administered 2017-05-23: 15 mL via OROMUCOSAL
  Filled 2017-05-23: qty 15

## 2017-05-23 MED ORDER — ONDANSETRON HCL 4 MG/2ML IJ SOLN
4.0000 mg | Freq: Once | INTRAMUSCULAR | Status: AC
  Administered 2017-05-23: 4 mg via INTRAVENOUS

## 2017-05-23 MED ORDER — DIPHENHYDRAMINE HCL 50 MG/ML IJ SOLN
INTRAMUSCULAR | Status: AC
  Administered 2017-05-23: 25 mg via INTRAVENOUS
  Filled 2017-05-23: qty 1

## 2017-05-23 MED ORDER — ONDANSETRON HCL 4 MG/2ML IJ SOLN
INTRAMUSCULAR | Status: AC
  Administered 2017-05-23: 4 mg via INTRAVENOUS
  Filled 2017-05-23: qty 2

## 2017-05-23 MED ORDER — SODIUM CHLORIDE 0.9 % IV BOLUS (SEPSIS)
500.0000 mL | Freq: Once | INTRAVENOUS | Status: AC
  Administered 2017-05-23: 500 mL via INTRAVENOUS

## 2017-05-23 MED ORDER — DIPHENHYDRAMINE HCL 50 MG/ML IJ SOLN
25.0000 mg | Freq: Once | INTRAMUSCULAR | Status: AC
  Administered 2017-05-23: 25 mg via INTRAVENOUS

## 2017-05-23 MED ORDER — NITROGLYCERIN 0.4 MG SL SUBL
0.4000 mg | SUBLINGUAL_TABLET | SUBLINGUAL | Status: DC | PRN
  Administered 2017-05-23: 0.4 mg via SUBLINGUAL
  Filled 2017-05-23: qty 1

## 2017-05-23 MED ORDER — ALUM & MAG HYDROXIDE-SIMETH 200-200-20 MG/5ML PO SUSP
15.0000 mL | Freq: Once | ORAL | Status: AC
  Administered 2017-05-23: 15 mL via ORAL
  Filled 2017-05-23: qty 30

## 2017-05-23 MED ORDER — LEVOFLOXACIN IN D5W 500 MG/100ML IV SOLN
500.0000 mg | Freq: Once | INTRAVENOUS | Status: AC
  Administered 2017-05-23: 500 mg via INTRAVENOUS
  Filled 2017-05-23: qty 100

## 2017-05-23 NOTE — ED Provider Notes (Addendum)
[facility] Emergency Department Provider Note  ____________________________________________  Time seen: Approximately 5:39 PM  I have reviewed the triage vital signs and the nursing notes.   HISTORY  Chief Complaint Chest Pain    HPI [name] is a 77 y.o. female who complains of chest pain that started last night while she is taking her medicine and felt like a pelvis secondary throat. Even when that sharp pain seemed to pass, she still had residual discomfort in the chest. Nonradiating, no shortness of breath. Not exertional, not pleuritic. Worse lying flat. Worse eating a sausage and egg biscuit this morning, but she was able to eat without vomiting. No alleviating factors, moderate intensity. No diaphoresis fevers chills or vomiting.     Past Medical History:  Diagnosis Date  . Acute respiratory failure ([place]) 11/21/2014  . Anxiety   . Arthritis 08/05/2013  . Atherosclerotic peripheral vascular disease with gangrene [facility]) august 2012  . Cardiomyopathy, ischemic    a. EF 35 to 40% by echo in 2013 b. EF improved to 50-55% by echo in 04/2015; 11/2016 Echo: EF 55-60%, Gr1 DD, mildly dil LA, nl RV fxn.  . Cerebral infarct ([place]) 08/17/2013  . Chronic diastolic CHF (congestive heart failure) (HCC)    a. EF 50-55% by echo in 04/2015; b. 11/2016 Echo: EF 55-60%, Gr1 DD.  [name] COPD (chronic obstructive pulmonary disease) ([place])   . Coronary artery disease, occlusive    a. Previous PCI to the LAD, LCx, and RCA in 2010, 2011, 2013, and 2016. All performed in [place]; b. 11/2016 Lexiscan MV: EF 43%, no ischemia->low risk.  . 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 Diabetic diarrhea ([place]) 10/03/2014  . DM type 2, uncontrolled, with renal complications ([place]) 05/18/5100  . Hepatic steatosis    by CT abd pelvis  . History of kidney stones    at a younger age  . Hyperlipidemia LDL goal <100 02/23/2014  . Hypertensive heart disease   . Osteoporosis, post-menopausal   .  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 Pleural effusion 10/25/2012   10/2012 CT chest >> small to moderate R lung effusion>> chylothorax, 100% lymphs 10/2013 thoracentesis> cytology negative, WBC 1471, > 90% "small lymphs" 01/2014 CT chest> near complete resolution of pleural effusion, stable lymphadenopathy 08/2014 CT chest [place] ([facility]): small right sided effusion decreased in size, stable mediastinal lymphadenopathy 1.0cm largest, 57m  . 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 Sleep apnea   . Stage III chronic kidney disease [facility])      Patient Active Problem List   Diagnosis Date Noted  . Pill esophagitis 05/23/2017  . Abnormal weight gain 12/23/2016  . Chronic kidney disease (CKD), stage I 11/15/2016  . History of fall 11/12/2016  . Osteoarthritis 11/10/2016  . Cervical stenosis of spinal canal 10/10/2016  . Cervical radiculopathy at C7 07/02/2016  . Acute midline thoracic back pain 07/02/2016  . Epigastric hernia 04/01/2016  . Umbilical pain 58/52/7782  . CAD in native artery   . Type 2 DM with CKD stage 3 and hypertension ([place])   . Chronic renal insufficiency   . Shortness of  breath 09/12/2015  . Hypertensive heart disease   . OSA on CPAP 07/07/2015  . Hospital discharge follow-up 07/07/2015  . Chest pain with low risk of acute coronary syndrome 06/29/2015  . CAP (community acquired pneumonia)   . Pleural effusion, right   . Chronic diastolic CHF (congestive heart failure) ([place]) 04/18/2015  . Diabetic neuropathy ([place]) 02/21/2015  . Benign neoplasm of sigmoid colon   . Low back pain with radiation 04/04/2014  . Essential hypertension  04/04/2014  . Hyperlipidemia 02/23/2014  . Long-term use of high-risk medication 02/23/2014  . Dermatophytic onychia 08/17/2013  . Stable angina ([place]) 08/17/2013  . Cerebral infarct ([place]) 08/17/2013  . Bony exostosis 08/17/2013  . Arthritis 08/05/2013  . History of colonic polyps 02/20/2013  . Sarcoidosis ([place]) 12/07/2012  . Dysphagia 12/07/2012  . Nocturnal cough 10/25/2012  . Pulmonary nodule seen on imaging study 05/20/2012  . 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 Cardiomyopathy, ischemic   . Hepatic steatosis   . Osteoporosis, post-menopausal   . Peripheral vascular disease due to secondary diabetes mellitus ([place])   . DM (diabetes mellitus), type 2, uncontrolled, with renal complications ([place]) 89/21/1942  . Double vessel coronary artery disease 12/14/2011     Past Surgical History:  Procedure Laterality Date  . ABDOMINAL HYSTERECTOMY     at ge 39. secondary to bleeding/partial  . ANTERIOR CERVICAL DECOMP/DISCECTOMY FUSION N/A 10/10/2016   Procedure: Anterior Cervical Discectomy Fusion - Cervical four4- five - Cervical five-Cervical six - Cervical six-Cervical seven;  Surgeon: Earnie Larsson, MD;  Location: [facility];  Service: Neurosurgery;  Laterality: N/A;  . BREAST CYST ASPIRATION Right   . CARPAL TUNNEL RELEASE Bilateral   . CHOLECYSTECTOMY     in [place]   . COLONOSCOPY WITH PROPOFOL N/A 01/09/2015   Procedure: COLONOSCOPY WITH PROPOFOL;  Surgeon: Lucilla Lame, MD;  Location: [facility] ENDOSCOPY;  Service: Endoscopy;  Laterality: N/A;  . CORONARY ANGIOPLASTY WITH STENT PLACEMENT     [place]; [facility]  . EYE SURGERY     bil cataracts  . HERNIA REPAIR     umbilical/ Dr Pat Patrick  . PTCA  August 2012   Right Posterior tibial artery , [place]  . TOE AMPUTATION  Sept 2012   Right 2nd toe, [place]     Prior to Admission medications   Medication Sig Start Date End Date Taking? Authorizing Provider   acetaminophen (TYLENOL) 500 MG tablet Take 1,000 mg by mouth every 6 (six) hours as needed for mild pain or headache.    Yes [provider]  alendronate (FOSAMAX) 70 MG tablet Take 1 tablet (70 mg total) by mouth once a week. Pt takes on Sunday.   Take with a full glass of water on an empty stomach. 07/01/16  Yes [name], MD  ALPRAZolam [name]) 0.25 MG tablet Take 1 tablet (0.25 mg total) by mouth daily. 11/10/16  Yes [name], MD  aspirin EC 81 MG tablet Take 81 mg by mouth daily.   Yes [provider]  B-D UF III MINI PEN NEEDLES 31G X 5 MM MISC USE THREE TIMES A DAY 03/09/17  Yes [name], MD  BRILINTA 90 MG TABS tablet Take 1 tablet (90 mg total) by mouth 2 (two) times daily. 03/05/17  Yes [name], MD  budesonide-formoterol (SYMBICORT) 160-4.5 MCG/ACT inhaler Inhale 2 puffs into the lungs 2 (two) times daily. 02/23/15  Yes Simonne Maffucci  B, MD  diclofenac sodium (VOLTAREN) 1 % GEL Apply 2 g topically 4 (four) times daily.   Yes [provider]  docusate sodium (COLACE) 100 MG capsule Take 100 mg by mouth 2 (two) times daily as needed for mild constipation. Reported on 11/09/2015   Yes [provider]  fluticasone (FLONASE) 50 MCG/ACT nasal spray Place 2 sprays into both nostrils daily as needed for rhinitis.   Yes [provider]  furosemide (LASIX) 20 MG tablet Take 1 tablet (20 mg total) by mouth daily. Patient taking differently: Take 20 mg by mouth every other day.  06/11/16  Yes [name], MD  glucose blood (ACCU-CHEK AVIVA PLUS) test strip USE TO CHECK GLUCOSE  THREE TIMES DAILY 04/29/16  Yes [name], MD  HYDROcodone-acetaminophen (NORCO/VICODIN) 5-325 MG tablet Take 1-2 tablets by mouth every 4 (four) hours as needed (breakthrough pain). 10/13/16  Yes Pool, Mallie Mussel, MD  Insulin Lispro Prot & Lispro (HUMALOG MIX 75/25 KWIKPEN) (75-25) 100 UNIT/ML Kwikpen Inject 44-60 Units into the skin 2 (two) times daily  with a meal. 44 units in the morning and 60 units in the evening   Yes [provider]  isosorbide mononitrate (IMDUR) 30 MG 24 hr tablet TAKE 1 TABLET DAILY 04/01/17  Yes [name], MD  losartan (COZAAR) 100 MG tablet TAKE 1 TABLET DAILY 12/10/16  Yes [name], MD  LYRICA 100 MG capsule TAKE ONE CAPSULE BY MOUTH THREE TIMES DAILY 11/27/16  Yes [name], MD  metoprolol succinate (TOPROL-XL) 25 MG 24 hr tablet Take 1 tablet (25 mg total) by mouth daily. 06/18/16  Yes [name], MD  nitroGLYCERIN (NITROLINGUAL) 0.4 MG/SPRAY spray Place 1 spray under the tongue every 5 (five) minutes x 3 doses as needed for chest pain. 01/14/17  Yes [name], MD  ondansetron (ZOFRAN) 4 MG tablet Take 1 tablet (4 mg total) by mouth every 8 (eight) hours as needed for nausea or vomiting. 05/18/16  Yes Alfred Levins, [place], MD  PARoxetine (PAXIL) 20 MG tablet TAKE ONE TABLET BY MOUTH ONCE DAILY 06/25/16  Yes [name], MD  polyethylene glycol (MIRALAX / GLYCOLAX) packet Take 17 g by mouth daily as needed for mild constipation. Reported on 10/25/2015   Yes [provider]  rosuvastatin (CRESTOR) 20 MG tablet Take 20 mg by mouth daily.    Yes [provider]  traMADol (ULTRAM) 50 MG tablet Take 1 tablet (50 mg total) by mouth every 6 (six) hours as needed for moderate pain. 01/23/17  Yes [name], MD     Allergies Cephalexin; Contrast media [iodinated diagnostic agents]; Doxycycline; Gabapentin; and Sulfa antibiotics   Family History  Problem Relation Age of Onset  . Heart disease Father   . Heart attack Father   . Heart disease Sister   . Heart attack Sister   . Heart disease Brother   . Heart attack Brother   . Asthma Grandchild   . Breast cancer Maternal Aunt     Social History Social History   Tobacco Use  . Smoking status: Never Smoker  . Smokeless tobacco: Never Used  Substance Use Topics  . Alcohol use: No    Alcohol/week: 0.0 oz  . Drug  use: No    Review of Systems  Constitutional:   No fever or chills.  ENT:   No sore throat. No rhinorrhea. Cardiovascular:   Positive as above chest pain without palpitations or syncope. Respiratory:   No  dyspnea or cough. Gastrointestinal:  Positive generalized abdominal pain without vomiting or diarrhea Musculoskeletal:   Negative for focal pain or swelling All other systems reviewed and are negative except as documented above in ROS and HPI.  ____________________________________________   PHYSICAL EXAM:  VITAL SIGNS: ED Triage Vitals  Enc Vitals Group     BP 05/23/17 1444 (!) 184/60     Pulse Rate 05/23/17 1444 74     Resp 05/23/17 1730 18     Temp 05/23/17 1444 98.7 F (37.1 C)     Temp Source 05/23/17 1444 Oral     SpO2 05/23/17 1444 98 %     Weight 05/23/17 1444 182 lb (82.6 kg)     Height 05/23/17 1444 5\' 7"  (1.702 m)     Head Circumference --      Peak Flow --      Pain Score 05/23/17 1446 6     Pain Loc --      Pain Edu? --      Excl. in [place]? --     Vital signs reviewed, nursing assessments reviewed.   Constitutional:   Alert and oriented. Well appearing and in no distress. Eyes:   No scleral icterus.  EOMI. No nystagmus. No conjunctival pallor. PERRL. ENT   Head:   Normocephalic and atraumatic.   Nose:   No congestion/rhinnorhea.    Mouth/Throat:   MMM, no pharyngeal erythema. No peritonsillar mass.    Neck:   No meningismus. Full ROM. Hematological/Lymphatic/Immunilogical:   No cervical lymphadenopathy. Cardiovascular:   RRR. Symmetric bilateral radial and DP pulses.  No murmurs.  Respiratory:   Normal respiratory effort without tachypnea/retractions. Breath sounds are clear and equal bilaterally. No wheezes/rales/rhonchi. Gastrointestinal:   Soft without focal tenderness. Non distended. There is no CVA tenderness.  No rebound, rigidity, or guarding. Genitourinary:   deferred Musculoskeletal:   Normal range of motion in all extremities. No  joint effusions.  No lower extremity tenderness.  No edema. Chest wall tender to the touch over the superior half of the sternal margins, reproducing her pain Neurologic:   Normal speech and language.  Motor grossly intact. No gross focal neurologic deficits are appreciated.  Skin:    Skin is warm, dry and intact. No rash noted.  No petechiae, purpura, or bullae.  ____________________________________________    LABS (pertinent positives/negatives) (all labs ordered are listed, but only abnormal results are displayed) Labs Reviewed  BASIC METABOLIC PANEL - Abnormal; Notable for the following components:      Result Value   Glucose, Bld 176 (*)    BUN 22 (*)    Creatinine, Ser 1.25 (*)    GFR calc non Af Amer 40 (*)    GFR calc Af Amer 47 (*)    All other components within normal limits  CBC - Abnormal; Notable for the following components:   RDW 16.2 (*)    All other components within normal limits  HEPATIC FUNCTION PANEL - Abnormal; Notable for the following components:   Albumin 3.4 (*)    Bilirubin, Direct <0.1 (*)    All other components within normal limits  GLUCOSE, CAPILLARY - Abnormal; Notable for the following components:   Glucose-Capillary 122 (*)    All other components within normal limits  TROPONIN I  TROPONIN I  LIPASE, BLOOD  TROPONIN I  CBG MONITORING, ED   ____________________________________________   EKG  Interpreted by me Sinus rhythm rate of 74, left axis, first-degree AV block, right bundle-branch block, LVH with  associated repolarization abnormality, no acute ischemic changes.  ____________________________________________    RADIOLOGY  Ct Abdomen Pelvis Wo Contrast  Result Date: 05/23/2017 CLINICAL DATA:  Chest pain, abdominal distension EXAM: CT CHEST, ABDOMEN AND PELVIS WITHOUT CONTRAST TECHNIQUE: Multidetector CT imaging of the chest, abdomen and pelvis was performed following the standard protocol without IV contrast. COMPARISON:  Chest  x-ray from earlier in the same day, ultrasound of the abdomen from 12/29/2016, CT of the chest from 07/11/2015 FINDINGS: CT CHEST FINDINGS Cardiovascular: Atherosclerotic calcifications are noted. No aneurysmal dilatation is identified. No cardiac enlargement is seen. Mitral valve calcifications are noted. Coronary calcifications are seen. No pericardial effusion is noted. Mediastinum/Nodes: Thoracic inlet is within normal limits. The esophagus is mildly dilated with air proximally although no definitive foreign body is seen. Multiple calcified hilar and mediastinal lymph nodes are noted consistent with prior granulomatous disease. No significant axillary adenopathy is noted. Lungs/Pleura: Moderate size right-sided pleural effusion is seen. Very mild infiltrative changes are noted in the right lower lobe. Scattered pulmonary nodules are noted. One lies in the right lower lobe measuring approximately 5 mm. This is 1 mm larger than that seen in 2017 and given the difference in imaging technique likely unchanged. Additionally a somewhat spiculated soft tissue density is noted in the right suprahilar region best seen on image number 51 of series 4 measuring 1.2 cm. This is also stable from the prior exam and as well as previous exams and consistent with a benign etiology. Musculoskeletal: Degenerative changes of the thoracic spine are noted. Postoperative changes in the cervical spine are seen. CT ABDOMEN PELVIS FINDINGS Hepatobiliary: No focal liver abnormality is seen. Status post cholecystectomy. No biliary dilatation. Pancreas: Unremarkable. No pancreatic ductal dilatation or surrounding inflammatory changes. Spleen: Normal in size without focal abnormality. Adrenals/Urinary Tract: Adrenal glands and kidneys are within normal limits. No calculi are seen. The bladder is well distended. Stomach/Bowel: Scattered mild diverticular changes noted. No obstructive changes are seen. The appendix is not well visualized. No  inflammatory changes are seen. Some inspissated barium is noted in what appears to be the central portion of the appendix. Correlate with any surgical history. No obstructive or inflammatory changes are seen. Vascular/Lymphatic: Aortic atherosclerosis. No enlarged abdominal or pelvic lymph nodes. Reproductive: Status post hysterectomy. No adnexal masses. Other: No abdominal wall hernia or abnormality. No abdominopelvic ascites. Musculoskeletal: Degenerative changes of the lumbar spine are noted. IMPRESSION: Changes consistent with prior granulomatous disease. Right-sided pleural effusion and mild right lower lobe infiltrate. Stable pulmonary nodules as described above consistent with benign etiology. Chronic changes without acute abnormality within the abdomen and pelvis. Electronically Signed   By: Inez Catalina M.D.   On: 05/23/2017 18:04   Dg Chest 2 View  Result Date: 05/23/2017 CLINICAL DATA:  Chest pain EXAM: CHEST  2 VIEW COMPARISON:  12/24/2016 FINDINGS: Low lung volumes. Bibasilar subsegmental atelectasis. Small right pleural effusion is suspected. Interstitial prominence. Normal vascularity. Chronic left rib deformities. IMPRESSION: Bibasilar atelectasis Small right pleural effusion. Electronically Signed   By: Marybelle Killings M.D.   On: 05/23/2017 15:55   Ct Chest Wo Contrast  Result Date: 05/23/2017 CLINICAL DATA:  Chest pain, abdominal distension EXAM: CT CHEST, ABDOMEN AND PELVIS WITHOUT CONTRAST TECHNIQUE: Multidetector CT imaging of the chest, abdomen and pelvis was performed following the standard protocol without IV contrast. COMPARISON:  Chest x-ray from earlier in the same day, ultrasound of the abdomen from 12/29/2016, CT of the chest from 07/11/2015 FINDINGS: CT CHEST FINDINGS Cardiovascular: Atherosclerotic calcifications  are noted. No aneurysmal dilatation is identified. No cardiac enlargement is seen. Mitral valve calcifications are noted. Coronary calcifications are seen. No pericardial  effusion is noted. Mediastinum/Nodes: Thoracic inlet is within normal limits. The esophagus is mildly dilated with air proximally although no definitive foreign body is seen. Multiple calcified hilar and mediastinal lymph nodes are noted consistent with prior granulomatous disease. No significant axillary adenopathy is noted. Lungs/Pleura: Moderate size right-sided pleural effusion is seen. Very mild infiltrative changes are noted in the right lower lobe. Scattered pulmonary nodules are noted. One lies in the right lower lobe measuring approximately 5 mm. This is 1 mm larger than that seen in 2017 and given the difference in imaging technique likely unchanged. Additionally a somewhat spiculated soft tissue density is noted in the right suprahilar region best seen on image number 51 of series 4 measuring 1.2 cm. This is also stable from the prior exam and as well as previous exams and consistent with a benign etiology. Musculoskeletal: Degenerative changes of the thoracic spine are noted. Postoperative changes in the cervical spine are seen. CT ABDOMEN PELVIS FINDINGS Hepatobiliary: No focal liver abnormality is seen. Status post cholecystectomy. No biliary dilatation. Pancreas: Unremarkable. No pancreatic ductal dilatation or surrounding inflammatory changes. Spleen: Normal in size without focal abnormality. Adrenals/Urinary Tract: Adrenal glands and kidneys are within normal limits. No calculi are seen. The bladder is well distended. Stomach/Bowel: Scattered mild diverticular changes noted. No obstructive changes are seen. The appendix is not well visualized. No inflammatory changes are seen. Some inspissated barium is noted in what appears to be the central portion of the appendix. Correlate with any surgical history. No obstructive or inflammatory changes are seen. Vascular/Lymphatic: Aortic atherosclerosis. No enlarged abdominal or pelvic lymph nodes. Reproductive: Status post hysterectomy. No adnexal masses.  Other: No abdominal wall hernia or abnormality. No abdominopelvic ascites. Musculoskeletal: Degenerative changes of the lumbar spine are noted. IMPRESSION: Changes consistent with prior granulomatous disease. Right-sided pleural effusion and mild right lower lobe infiltrate. Stable pulmonary nodules as described above consistent with benign etiology. Chronic changes without acute abnormality within the abdomen and pelvis. Electronically Signed   By: Inez Catalina M.D.   On: 05/23/2017 18:04    ____________________________________________   PROCEDURES Procedures  ____________________________________________   DIFFERENTIAL DIAGNOSIS Pill esophagitis, esophageal foreign body, GERD, non-STEMI, functional abdominal pain, bowel obstruction, appendicitis  CLINICAL IMPRESSION / ASSESSMENT AND PLAN / ED COURSE  Pertinent labs & imaging results that were available during my care of the patient were reviewed by me and considered in my medical decision making (see chart for details).   Patient presents with chest pain symptoms that are both reproducible and suggestive of chest wall pain or costochondritis and also consistent with pill esophagitis. We will give a trial of lidocaine and Maalox while getting a CT scan to evaluate for retained foreign body in the esophagus. Her abdominal pain is vague and not concerning for mesenteric ischemia or aortic pathology. She doesn't have focal tenderness, but does have some generalized discomfort with palpation. For this reason, we'll obtain CT scan of the abdomen to further evaluate.  Clinical Course as of May 23 2332  Sat May 23, 2017  1903 Awaiting serial trop, after which pt likely stable for DC home.   [PS]  2112 VSS, but not well appearing. Still with chest pain. Will try lidocaine swallow. Nitroglycerin. Ivf, ceftriaxone and azithro for finding of airspace infiltrate on CT. Pt and spouse very concerned about her symptoms, which are not normal  for her and  she feels are severe. Will plan to hospitalize for further evaluation.   [PS]  2204 Pain resolved after lidocaine and nitrlycerin. Had shared MDM conversation with pt and spouse, they would be much more comfortable with overnight obs.   [PS]    Clinical Course User Index [PS] Carrie Mew, MD     ----------------------------------------- 10:18 PM on 05/23/2017 -----------------------------------------  Case discussed with hospitalist. Having some erythema on the arm in the area of the Levaquin infusion. Levaquin was stopped by the nurse we will give her some Benadryl as well  ____________________________________________   FINAL CLINICAL IMPRESSION(S) / ED DIAGNOSES    Final diagnoses:  Nonspecific chest pain  Pill esophagitis  Pneumonia of right lower lobe due to infectious organism [facility])       Portions of this note were generated with dragon dictation software. Dictation errors may occur despite best attempts at proofreading.    Carrie Mew, MD 05/23/17 2218    Carrie Mew, MD 05/23/17 [phone]

## 2017-05-23 NOTE — ED Notes (Signed)
Pt given apple juice for PO challenge.

## 2017-05-23 NOTE — ED Triage Notes (Signed)
Pt here for mid chest pain since last night.  Reports felt like a pill got stuck but that feeling went away and still has CP.  Denies SHOB at this time. Unlabored.  Skin color WNL.  Generalized shaking.  VSS

## 2017-05-23 NOTE — ED Notes (Signed)
Patient transported to CT 

## 2017-05-23 NOTE — ED Notes (Signed)
Patient called out to state her right arm where the Levaquin is going in that her arm is itchy, and it appears red from forearm to elbow.  I told her I would seek a benadryl order from MD.

## 2017-05-23 NOTE — H&P (Signed)
[name] [name]    MR#:  [id number]  DATE OF BIRTH:  11-Feb-1941  DATE OF ADMISSION:  05/23/2017  PRIMARY CARE PHYSICIAN: [name], MD   REQUESTING/REFERRING PHYSICIAN: Joni Fears, MD  CHIEF COMPLAINT:   Chief Complaint  Patient presents with  . Chest Pain    HISTORY OF PRESENT ILLNESS:  Pamela Collier  is a 77 y.o. female who presents with central chest discomfort.  Patient states that she took her medicine, and shortly afterwards started having this chest discomfort.  Workup here in the ED largely rules out cardiac source with 3 neg troponins.  Strong suspicion for pill esophagitis.  However, on imaging she was also found to have right basilar pneumonia.  Hospitalist were called for admission  PAST MEDICAL HISTORY:   Past Medical History:  Diagnosis Date  . Acute respiratory failure ([place]) 11/21/2014  . Anxiety   . Arthritis 08/05/2013  . Atherosclerotic peripheral vascular disease with gangrene [facility]) august 2012  . Cardiomyopathy, ischemic    a. EF 35 to 40% by echo in 2013 b. EF improved to 50-55% by echo in 04/2015; 11/2016 Echo: EF 55-60%, Gr1 DD, mildly dil LA, nl RV fxn.  . Cerebral infarct ([place]) 08/17/2013  . Chronic diastolic CHF (congestive heart failure) (HCC)    a. EF 50-55% by echo in 04/2015; b. 11/2016 Echo: EF 55-60%, Gr1 DD.  [name] COPD (chronic obstructive pulmonary disease) ([place])   . Coronary artery disease, occlusive    a. Previous PCI to the LAD, LCx, and RCA in 2010, 2011, 2013, and 2016. All performed in [place]; b. 11/2016 Lexiscan MV: EF 43%, no ischemia->low risk.  . 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 Diabetic diarrhea ([place]) 10/03/2014  . DM type 2, uncontrolled, with renal complications ([place]) 7/0/3500  . Hepatic steatosis    by CT abd pelvis  . History of kidney stones    at a younger age  . Hyperlipidemia LDL goal <100 02/23/2014  . Hypertensive heart disease   . Osteoporosis,  post-menopausal   . 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 Pleural effusion 10/25/2012   10/2012 CT chest >> small to moderate R lung effusion>> chylothorax, 100% lymphs 10/2013 thoracentesis> cytology negative, WBC 1471, > 90% "small lymphs" 01/2014 CT chest> near complete resolution of pleural effusion, stable lymphadenopathy 08/2014 CT chest [place] ([facility]): small right sided effusion decreased in size, stable mediastinal lymphadenopathy 1.0cm largest, 50m  . 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 Sleep apnea   . Stage III chronic kidney disease ([place])     PAST SURGICAL HISTORY:   Past Surgical History:  Procedure Laterality Date  . ABDOMINAL HYSTERECTOMY     at ge 39. secondary to bleeding/partial  . ANTERIOR CERVICAL DECOMP/DISCECTOMY FUSION N/A 10/10/2016   Procedure: Anterior Cervical Discectomy Fusion - Cervical four4- five - Cervical five-Cervical six - Cervical six-Cervical seven;  Surgeon: Earnie Larsson, MD;  Location: [place];  Service: Neurosurgery;  Laterality: N/A;  . BREAST CYST ASPIRATION Right   . CARPAL TUNNEL RELEASE Bilateral   . CHOLECYSTECTOMY  in [place]   . COLONOSCOPY WITH PROPOFOL N/A 01/09/2015   Procedure: COLONOSCOPY WITH PROPOFOL;  Surgeon: Lucilla Lame, MD;  Location: [facility] ENDOSCOPY;  Service: Endoscopy;  Laterality: N/A;  . CORONARY ANGIOPLASTY WITH STENT PLACEMENT     [place]; [facility]  . EYE SURGERY     bil cataracts  . HERNIA REPAIR     umbilical/ Dr Pat Patrick  . PTCA  August 2012   Right Posterior tibial artery , [place]  . TOE AMPUTATION  Sept 2012   Right 2nd toe, [place]    SOCIAL HISTORY:    Social History   Tobacco Use  . Smoking status: Never Smoker  . Smokeless tobacco: Never Used  Substance Use Topics  . Alcohol use: No    Alcohol/week: 0.0 oz    FAMILY HISTORY:   Family History  Problem Relation Age of Onset  . Heart disease Father   . Heart attack Father   . Heart disease Sister   . Heart attack Sister   . Heart disease Brother   . Heart attack Brother   . Asthma Grandchild   . Breast cancer Maternal Aunt     DRUG ALLERGIES:   Allergies  Allergen Reactions  . Cephalexin Itching  . Contrast Media [Iodinated Diagnostic Agents] Itching  . Doxycycline Itching  . Gabapentin Itching  . Sulfa Antibiotics Itching    MEDICATIONS AT HOME:   Prior to Admission medications   Medication Sig Start Date End Date Taking? Authorizing Provider  acetaminophen (TYLENOL) 500 MG tablet Take 1,000 mg by mouth every 6 (six) hours as needed for mild pain or headache.    Yes [provider]  alendronate (FOSAMAX) 70 MG tablet Take 1 tablet (70 mg total) by mouth once a week. Pt takes on Sunday.   Take with a full glass of water on an empty stomach. 07/01/16  Yes [name], MD  ALPRAZolam [name]) 0.25 MG tablet Take 1 tablet (0.25 mg total) by mouth daily. 11/10/16  Yes [name], MD  aspirin EC 81 MG tablet Take 81 mg by mouth daily.   Yes [provider]  B-D UF III MINI PEN NEEDLES 31G X 5 MM MISC USE THREE TIMES A DAY 03/09/17  Yes [name], MD  BRILINTA 90 MG TABS tablet Take 1 tablet (90 mg total) by mouth 2 (two) times daily. 03/05/17  Yes [name], MD  budesonide-formoterol (SYMBICORT) 160-4.5 MCG/ACT inhaler Inhale 2 puffs into the lungs 2 (two) times daily. 02/23/15  Yes Juanito Doom, MD  diclofenac sodium (VOLTAREN) 1 % GEL Apply 2 g topically 4 (four) times daily.   Yes [provider]  docusate sodium (COLACE) 100 MG capsule Take 100 mg by mouth 2 (two) times daily as needed for mild constipation.  Reported on 11/09/2015   Yes [provider]  fluticasone (FLONASE) 50 MCG/ACT nasal spray Place 2 sprays into both nostrils daily as needed for rhinitis.   Yes [provider]  furosemide (LASIX) 20 MG tablet Take 1 tablet (20 mg total) by mouth daily. Patient taking differently: Take 20 mg by mouth every other day.  06/11/16  Yes [name], MD  glucose blood (ACCU-CHEK AVIVA PLUS) test strip USE TO CHECK GLUCOSE  THREE TIMES DAILY 04/29/16  Yes [name], MD  HYDROcodone-acetaminophen (NORCO/VICODIN) 5-325 MG tablet Take 1-2 tablets by mouth every 4 (four) hours as needed (breakthrough pain). 10/13/16  Yes Pool, Mallie Mussel,  MD  Insulin Lispro Prot & Lispro (HUMALOG MIX 75/25 KWIKPEN) (75-25) 100 UNIT/ML Kwikpen Inject 44-60 Units into the skin 2 (two) times daily with a meal. 44 units in the morning and 60 units in the evening   Yes [provider]  isosorbide mononitrate (IMDUR) 30 MG 24 hr tablet TAKE 1 TABLET DAILY 04/01/17  Yes [name], MD  losartan (COZAAR) 100 MG tablet TAKE 1 TABLET DAILY 12/10/16  Yes [name], MD  LYRICA 100 MG capsule TAKE ONE CAPSULE BY MOUTH THREE TIMES DAILY 11/27/16  Yes [name], MD  metoprolol succinate (TOPROL-XL) 25 MG 24 hr tablet Take 1 tablet (25 mg total) by mouth daily. 06/18/16  Yes [name], MD  nitroGLYCERIN (NITROLINGUAL) 0.4 MG/SPRAY spray Place 1 spray under the tongue every 5 (five) minutes x 3 doses as needed for chest pain. 01/14/17  Yes [name], MD  ondansetron (ZOFRAN) 4 MG tablet Take 1 tablet (4 mg total) by mouth every 8 (eight) hours as needed for nausea or vomiting. 05/18/16  Yes Alfred Levins, [place], MD  PARoxetine (PAXIL) 20 MG tablet TAKE ONE TABLET BY MOUTH ONCE DAILY 06/25/16  Yes [name], MD  polyethylene glycol (MIRALAX / GLYCOLAX) packet Take 17 g by mouth daily as needed for mild constipation. Reported on 10/25/2015   Yes [provider]  rosuvastatin (CRESTOR) 20  MG tablet Take 20 mg by mouth daily.    Yes [provider]  traMADol (ULTRAM) 50 MG tablet Take 1 tablet (50 mg total) by mouth every 6 (six) hours as needed for moderate pain. 01/23/17  Yes [name], MD    REVIEW OF SYSTEMS:  Review of Systems  Constitutional: Positive for malaise/fatigue. Negative for chills, fever and weight loss.  HENT: Negative for ear pain, hearing loss and tinnitus.   Eyes: Negative for blurred vision, double vision, pain and redness.  Respiratory: Positive for cough. Negative for hemoptysis and shortness of breath.   Cardiovascular: Positive for chest pain. Negative for palpitations, orthopnea and leg swelling.  Gastrointestinal: Negative for abdominal pain, constipation, diarrhea, nausea and vomiting.  Genitourinary: Negative for dysuria, frequency and hematuria.  Musculoskeletal: Negative for back pain, joint pain and neck pain.  Skin:       No acne, rash, or lesions  Neurological: Negative for dizziness, tremors, focal weakness and weakness.  Endo/Heme/Allergies: Negative for polydipsia. Does not bruise/bleed easily.  Psychiatric/Behavioral: Negative for depression. The patient is not nervous/anxious and does not have insomnia.      VITAL SIGNS:   Vitals:   05/23/17 2000 05/23/17 2119 05/23/17 2130 05/23/17 2230  BP: (!) 170/73 (!) 182/83 136/64 (!) 148/92  Pulse: 70 71 76 72  Resp: 16 18 17 18   Temp:      TempSrc:      SpO2: 100% 99% 95% 97%  Weight:      Height:       Wt Readings from Last 3 Encounters:  05/23/17 82.6 kg (182 lb)  02/26/17 86 kg (189 lb 8 oz)  12/22/16 87.6 kg (193 lb 3.2 oz)    PHYSICAL EXAMINATION:  Physical Exam  Vitals reviewed. Constitutional: She is oriented to person, place, and time. She appears well-developed and well-nourished. No distress.  HENT:  Head: Normocephalic and atraumatic.  Mouth/Throat: Oropharynx is clear and moist.  Eyes: Conjunctivae and EOM are normal. Pupils are equal, round, and  reactive to light. No scleral icterus.  Neck: Normal range of motion. Neck  supple. No JVD present. No thyromegaly present.  Cardiovascular: Normal rate, regular rhythm and intact distal pulses. Exam reveals no gallop and no friction rub.  No murmur heard. Respiratory: Effort normal. No respiratory distress. She has no wheezes. She has rales.  GI: Soft. Bowel sounds are normal. She exhibits no distension. There is no tenderness.  Musculoskeletal: Normal range of motion. She exhibits no edema.  No arthritis, no gout  Lymphadenopathy:    She has no cervical adenopathy.  Neurological: She is alert and oriented to person, place, and time. No cranial nerve deficit.  No dysarthria, no aphasia  Skin: Skin is warm and dry. No rash noted. No erythema.  Psychiatric: She has a normal mood and affect. Her behavior is normal. Judgment and thought content normal.    LABORATORY PANEL:   CBC Recent Labs  Lab 05/23/17 1444  WBC 6.4  HGB 12.1  HCT 36.7  PLT 313   ------------------------------------------------------------------------------------------------------------------  Chemistries  Recent Labs  Lab 05/23/17 1444  NA 139  K 3.7  CL 104  CO2 27  GLUCOSE 176*  BUN 22*  CREATININE 1.25*  CALCIUM 9.3  AST 24  ALT 14  ALKPHOS 59  BILITOT 0.8   ------------------------------------------------------------------------------------------------------------------  Cardiac Enzymes Recent Labs  Lab 05/23/17 1957  TROPONINI <0.03   ------------------------------------------------------------------------------------------------------------------  RADIOLOGY:  Ct Abdomen Pelvis Wo Contrast  Result Date: 05/23/2017 CLINICAL DATA:  Chest pain, abdominal distension EXAM: CT CHEST, ABDOMEN AND PELVIS WITHOUT CONTRAST TECHNIQUE: Multidetector CT imaging of the chest, abdomen and pelvis was performed following the standard protocol without IV contrast. COMPARISON:  Chest x-ray from earlier in  the same day, ultrasound of the abdomen from 12/29/2016, CT of the chest from 07/11/2015 FINDINGS: CT CHEST FINDINGS Cardiovascular: Atherosclerotic calcifications are noted. No aneurysmal dilatation is identified. No cardiac enlargement is seen. Mitral valve calcifications are noted. Coronary calcifications are seen. No pericardial effusion is noted. Mediastinum/Nodes: Thoracic inlet is within normal limits. The esophagus is mildly dilated with air proximally although no definitive foreign body is seen. Multiple calcified hilar and mediastinal lymph nodes are noted consistent with prior granulomatous disease. No significant axillary adenopathy is noted. Lungs/Pleura: Moderate size right-sided pleural effusion is seen. Very mild infiltrative changes are noted in the right lower lobe. Scattered pulmonary nodules are noted. One lies in the right lower lobe measuring approximately 5 mm. This is 1 mm larger than that seen in 2017 and given the difference in imaging technique likely unchanged. Additionally a somewhat spiculated soft tissue density is noted in the right suprahilar region best seen on image number 51 of series 4 measuring 1.2 cm. This is also stable from the prior exam and as well as previous exams and consistent with a benign etiology. Musculoskeletal: Degenerative changes of the thoracic spine are noted. Postoperative changes in the cervical spine are seen. CT ABDOMEN PELVIS FINDINGS Hepatobiliary: No focal liver abnormality is seen. Status post cholecystectomy. No biliary dilatation. Pancreas: Unremarkable. No pancreatic ductal dilatation or surrounding inflammatory changes. Spleen: Normal in size without focal abnormality. Adrenals/Urinary Tract: Adrenal glands and kidneys are within normal limits. No calculi are seen. The bladder is well distended. Stomach/Bowel: Scattered mild diverticular changes noted. No obstructive changes are seen. The appendix is not well visualized. No inflammatory changes are  seen. Some inspissated barium is noted in what appears to be the central portion of the appendix. Correlate with any surgical history. No obstructive or inflammatory changes are seen. Vascular/Lymphatic: Aortic atherosclerosis. No enlarged abdominal or pelvic lymph nodes.  Reproductive: Status post hysterectomy. No adnexal masses. Other: No abdominal wall hernia or abnormality. No abdominopelvic ascites. Musculoskeletal: Degenerative changes of the lumbar spine are noted. IMPRESSION: Changes consistent with prior granulomatous disease. Right-sided pleural effusion and mild right lower lobe infiltrate. Stable pulmonary nodules as described above consistent with benign etiology. Chronic changes without acute abnormality within the abdomen and pelvis. Electronically Signed   By: Inez Catalina M.D.   On: 05/23/2017 18:04   Dg Chest 2 View  Result Date: 05/23/2017 CLINICAL DATA:  Chest pain EXAM: CHEST  2 VIEW COMPARISON:  12/24/2016 FINDINGS: Low lung volumes. Bibasilar subsegmental atelectasis. Small right pleural effusion is suspected. Interstitial prominence. Normal vascularity. Chronic left rib deformities. IMPRESSION: Bibasilar atelectasis Small right pleural effusion. Electronically Signed   By: Marybelle Killings M.D.   On: 05/23/2017 15:55   Ct Chest Wo Contrast  Result Date: 05/23/2017 CLINICAL DATA:  Chest pain, abdominal distension EXAM: CT CHEST, ABDOMEN AND PELVIS WITHOUT CONTRAST TECHNIQUE: Multidetector CT imaging of the chest, abdomen and pelvis was performed following the standard protocol without IV contrast. COMPARISON:  Chest x-ray from earlier in the same day, ultrasound of the abdomen from 12/29/2016, CT of the chest from 07/11/2015 FINDINGS: CT CHEST FINDINGS Cardiovascular: Atherosclerotic calcifications are noted. No aneurysmal dilatation is identified. No cardiac enlargement is seen. Mitral valve calcifications are noted. Coronary calcifications are seen. No pericardial effusion is noted.  Mediastinum/Nodes: Thoracic inlet is within normal limits. The esophagus is mildly dilated with air proximally although no definitive foreign body is seen. Multiple calcified hilar and mediastinal lymph nodes are noted consistent with prior granulomatous disease. No significant axillary adenopathy is noted. Lungs/Pleura: Moderate size right-sided pleural effusion is seen. Very mild infiltrative changes are noted in the right lower lobe. Scattered pulmonary nodules are noted. One lies in the right lower lobe measuring approximately 5 mm. This is 1 mm larger than that seen in 2017 and given the difference in imaging technique likely unchanged. Additionally a somewhat spiculated soft tissue density is noted in the right suprahilar region best seen on image number 51 of series 4 measuring 1.2 cm. This is also stable from the prior exam and as well as previous exams and consistent with a benign etiology. Musculoskeletal: Degenerative changes of the thoracic spine are noted. Postoperative changes in the cervical spine are seen. CT ABDOMEN PELVIS FINDINGS Hepatobiliary: No focal liver abnormality is seen. Status post cholecystectomy. No biliary dilatation. Pancreas: Unremarkable. No pancreatic ductal dilatation or surrounding inflammatory changes. Spleen: Normal in size without focal abnormality. Adrenals/Urinary Tract: Adrenal glands and kidneys are within normal limits. No calculi are seen. The bladder is well distended. Stomach/Bowel: Scattered mild diverticular changes noted. No obstructive changes are seen. The appendix is not well visualized. No inflammatory changes are seen. Some inspissated barium is noted in what appears to be the central portion of the appendix. Correlate with any surgical history. No obstructive or inflammatory changes are seen. Vascular/Lymphatic: Aortic atherosclerosis. No enlarged abdominal or pelvic lymph nodes. Reproductive: Status post hysterectomy. No adnexal masses. Other: No abdominal  wall hernia or abnormality. No abdominopelvic ascites. Musculoskeletal: Degenerative changes of the lumbar spine are noted. IMPRESSION: Changes consistent with prior granulomatous disease. Right-sided pleural effusion and mild right lower lobe infiltrate. Stable pulmonary nodules as described above consistent with benign etiology. Chronic changes without acute abnormality within the abdomen and pelvis. Electronically Signed   By: Inez Catalina M.D.   On: 05/23/2017 18:04    EKG:   Orders placed or performed  during the hospital encounter of 05/23/17  . ED EKG within 10 minutes  . ED EKG within 10 minutes  . EKG 12-Lead  . EKG 12-Lead    IMPRESSION AND PLAN:  Principal Problem:   CAP (community acquired pneumonia) -antibiotics and supportive treatment PRN Active Problems:   Pill esophagitis -PRN viscous lidocaine swish and swallow   DM (diabetes mellitus), type 2, uncontrolled, with renal complications (HCC) -sliding scale insulin with corresponding glucose checks   Essential hypertension -continue home meds   Chronic diastolic CHF (congestive heart failure) (HCC) -continue home medications   OSA on CPAP -CPAP nightly  All the records are reviewed and case discussed with ED provider. Management plans discussed with the patient and/or family.  DVT PROPHYLAXIS: SubQ lovenox  GI PROPHYLAXIS: None  ADMISSION STATUS: Observation  CODE STATUS: Full Code Status History    Date Active Date Inactive Code Status Order ID Comments User Context   11/14/2016 22:49 11/15/2016 18:29 Full Code [id number]  Harvie Bridge, DO ED   10/10/2016 13:25 10/13/2016 20:20 Full Code [id number]  Earnie Larsson, MD Inpatient   06/11/2016 21:42 06/12/2016 19:23 Full Code [id number]  Harvie Bridge, DO ED   01/02/2016 05:24 01/02/2016 09:14 Full Code [id number]  Saundra Shelling, MD Inpatient   06/26/2015 07:36 06/29/2015 19:54 Full Code [id number]  Demetrios Loll, MD ED   04/20/2015 01:18 04/22/2015 19:18 Full Code [id number]   Lance Coon, MD Inpatient      TOTAL TIME TAKING CARE OF THIS PATIENT: 40 minutes.   Pamela Collier Story 05/23/2017, 11:54 PM  [facility]  Office  [phone]  CC: Primary care physician; [name], MD  Note:  This document was prepared using Dragon voice recognition software and may include unintentional dictation errors.

## 2017-05-24 ENCOUNTER — Observation Stay (HOSPITAL_BASED_OUTPATIENT_CLINIC_OR_DEPARTMENT_OTHER)
Admit: 2017-05-24 | Discharge: 2017-05-24 | Disposition: A | Discharge status: Home / Self Care | Payer: Medicare Other | Attending: Internal Medicine | Admitting: Internal Medicine

## 2017-05-24 ENCOUNTER — Other Ambulatory Visit: Payer: Self-pay

## 2017-05-24 DIAGNOSIS — J189 Pneumonia, unspecified organism: Secondary | ICD-10-CM | POA: Diagnosis not present

## 2017-05-24 DIAGNOSIS — I10 Essential (primary) hypertension: Secondary | ICD-10-CM | POA: Diagnosis not present

## 2017-05-24 DIAGNOSIS — K208 Other esophagitis: Secondary | ICD-10-CM | POA: Diagnosis not present

## 2017-05-24 DIAGNOSIS — E119 Type 2 diabetes mellitus without complications: Secondary | ICD-10-CM | POA: Diagnosis not present

## 2017-05-24 DIAGNOSIS — I5030 Unspecified diastolic (congestive) heart failure: Secondary | ICD-10-CM | POA: Diagnosis not present

## 2017-05-24 LAB — BASIC METABOLIC PANEL
Anion gap: 5 (ref 5–15)
BUN: 18 mg/dL (ref 6–20)
CHLORIDE: 107 mmol/L (ref 101–111)
CO2: 27 mmol/L (ref 22–32)
CREATININE: 1.15 mg/dL — AB (ref 0.44–1.00)
Calcium: 8.9 mg/dL (ref 8.9–10.3)
GFR, EST AFRICAN AMERICAN: 52 mL/min — AB (ref >60)
GFR, EST NON AFRICAN AMERICAN: 45 mL/min — AB (ref >60)
Glucose, Bld: 133 mg/dL — ABNORMAL HIGH (ref 65–99)
Potassium: 4.1 mmol/L (ref 3.5–5.1)
Sodium: 139 mmol/L (ref 135–145)

## 2017-05-24 LAB — ECHOCARDIOGRAM COMPLETE
Height: 67 in
Weight: 2961 oz

## 2017-05-24 LAB — GLUCOSE, CAPILLARY
GLUCOSE-CAPILLARY: 195 mg/dL — AB (ref 65–99)
Glucose-Capillary: 135 mg/dL — ABNORMAL HIGH (ref 65–99)
Glucose-Capillary: 108 mg/dL — ABNORMAL HIGH (ref 65–99)
Glucose-Capillary: 168 mg/dL — ABNORMAL HIGH (ref 65–99)
Glucose-Capillary: 177 mg/dL — ABNORMAL HIGH (ref 65–99)

## 2017-05-24 LAB — CBC
HCT: 35.2 % (ref 35.0–47.0)
Hemoglobin: 11.7 g/dL — ABNORMAL LOW (ref 12.0–16.0)
MCH: 28.9 pg (ref 26.0–34.0)
MCHC: 33.2 g/dL (ref 32.0–36.0)
MCV: 87.1 fL (ref 80.0–100.0)
PLATELETS: 272 10*3/uL (ref 150–440)
RBC: 4.04 MIL/uL (ref 3.80–5.20)
RDW: 16.3 % — AB (ref 11.5–14.5)
WBC: 5.5 10*3/uL (ref 3.6–11.0)

## 2017-05-24 MED ORDER — METOPROLOL SUCCINATE ER 25 MG PO TB24
25.0000 mg | ORAL_TABLET | Freq: Every day | ORAL | Status: DC
  Administered 2017-05-24 – 2017-05-26 (×3): 25 mg via ORAL
  Filled 2017-05-24 (×3): qty 1

## 2017-05-24 MED ORDER — LEVOFLOXACIN IN D5W 500 MG/100ML IV SOLN
500.0000 mg | Freq: Once | INTRAVENOUS | Status: AC
  Administered 2017-05-24: 500 mg via INTRAVENOUS
  Filled 2017-05-24: qty 100

## 2017-05-24 MED ORDER — HYDROCODONE-ACETAMINOPHEN 5-325 MG PO TABS
1.0000 | ORAL_TABLET | ORAL | Status: DC | PRN
  Administered 2017-05-24: 03:00:00 1 via ORAL
  Filled 2017-05-24: qty 1

## 2017-05-24 MED ORDER — DIPHENHYDRAMINE HCL 25 MG PO CAPS
25.0000 mg | ORAL_CAPSULE | Freq: Once | ORAL | Status: AC
  Administered 2017-05-24: 13:00:00 25 mg via ORAL

## 2017-05-24 MED ORDER — ONDANSETRON HCL 4 MG PO TABS: 4.0000 mg | ORAL_TABLET | Freq: Four times a day (QID) | ORAL | Status: DC | PRN

## 2017-05-24 MED ORDER — ALPRAZOLAM 0.25 MG PO TABS
0.2500 mg | ORAL_TABLET | Freq: Every day | ORAL | Status: DC
  Administered 2017-05-24 – 2017-05-26 (×3): 0.25 mg via ORAL
  Filled 2017-05-24 (×3): qty 1

## 2017-05-24 MED ORDER — TRAMADOL HCL 50 MG PO TABS
50.0000 mg | ORAL_TABLET | Freq: Four times a day (QID) | ORAL | Status: DC | PRN
  Administered 2017-05-24 – 2017-05-25 (×3): 50 mg via ORAL
  Filled 2017-05-24 (×3): qty 1

## 2017-05-24 MED ORDER — ISOSORBIDE MONONITRATE ER 30 MG PO TB24
30.0000 mg | ORAL_TABLET | Freq: Every day | ORAL | Status: DC
  Administered 2017-05-24 – 2017-05-26 (×3): 30 mg via ORAL
  Filled 2017-05-24 (×3): qty 1

## 2017-05-24 MED ORDER — DEXTROSE 5 % IV SOLN
500.0000 mg | INTRAVENOUS | Status: DC
  Filled 2017-05-24: qty 500

## 2017-05-24 MED ORDER — PREGABALIN 50 MG PO CAPS
100.0000 mg | ORAL_CAPSULE | Freq: Three times a day (TID) | ORAL | Status: DC
  Administered 2017-05-24 – 2017-05-26 (×8): 100 mg via ORAL
  Filled 2017-05-24 (×8): qty 2

## 2017-05-24 MED ORDER — TICAGRELOR 90 MG PO TABS
90.0000 mg | ORAL_TABLET | Freq: Two times a day (BID) | ORAL | Status: DC
  Administered 2017-05-24 – 2017-05-26 (×5): 90 mg via ORAL
  Filled 2017-05-24 (×7): qty 1

## 2017-05-24 MED ORDER — PAROXETINE HCL 20 MG PO TABS
20.0000 mg | ORAL_TABLET | Freq: Every day | ORAL | Status: DC
  Administered 2017-05-24 – 2017-05-26 (×3): 20 mg via ORAL
  Filled 2017-05-24 (×3): qty 1

## 2017-05-24 MED ORDER — MOMETASONE FURO-FORMOTEROL FUM 200-5 MCG/ACT IN AERO
2.0000 | INHALATION_SPRAY | Freq: Two times a day (BID) | RESPIRATORY_TRACT | Status: DC
  Administered 2017-05-24 – 2017-05-26 (×5): 2 via RESPIRATORY_TRACT
  Filled 2017-05-24: qty 8.8

## 2017-05-24 MED ORDER — ACETAMINOPHEN 650 MG RE SUPP: 650.0000 mg | Freq: Four times a day (QID) | RECTAL | Status: DC | PRN

## 2017-05-24 MED ORDER — ROSUVASTATIN CALCIUM 20 MG PO TABS
20.0000 mg | ORAL_TABLET | Freq: Every day | ORAL | Status: DC
  Administered 2017-05-24 – 2017-05-26 (×3): 20 mg via ORAL
  Filled 2017-05-24 (×3): qty 1

## 2017-05-24 MED ORDER — LIDOCAINE VISCOUS 2 % MT SOLN
15.0000 mL | OROMUCOSAL | Status: DC | PRN
Start: 2017-05-24 — End: 2017-05-26
  Filled 2017-05-24: qty 15

## 2017-05-24 MED ORDER — ACETAMINOPHEN 325 MG PO TABS: 650.0000 mg | ORAL_TABLET | Freq: Four times a day (QID) | ORAL | Status: DC | PRN

## 2017-05-24 MED ORDER — FUROSEMIDE 20 MG PO TABS
20.0000 mg | ORAL_TABLET | ORAL | Status: DC
  Administered 2017-05-24 – 2017-05-26 (×2): 20 mg via ORAL
  Filled 2017-05-24 (×2): qty 1

## 2017-05-24 MED ORDER — ASPIRIN EC 81 MG PO TBEC
81.0000 mg | DELAYED_RELEASE_TABLET | Freq: Every day | ORAL | Status: DC
  Administered 2017-05-24 – 2017-05-26 (×3): 81 mg via ORAL
  Filled 2017-05-24 (×3): qty 1

## 2017-05-24 MED ORDER — LEVOFLOXACIN IN D5W 250 MG/50ML IV SOLN
250.0000 mg | INTRAVENOUS | Status: DC
  Filled 2017-05-24: qty 50

## 2017-05-24 MED ORDER — DIPHENHYDRAMINE HCL 25 MG PO CAPS
25.0000 mg | ORAL_CAPSULE | Freq: Four times a day (QID) | ORAL | Status: DC | PRN
  Administered 2017-05-24: 25 mg via ORAL
  Filled 2017-05-24: qty 1

## 2017-05-24 MED ORDER — LEVOFLOXACIN IN D5W 500 MG/100ML IV SOLN: 500.0000 mg | INTRAVENOUS | Status: DC

## 2017-05-24 MED ORDER — HYDRALAZINE HCL 20 MG/ML IJ SOLN
10.0000 mg | Freq: Four times a day (QID) | INTRAMUSCULAR | Status: DC | PRN
  Administered 2017-05-24: 10 mg via INTRAVENOUS
  Filled 2017-05-24: qty 1

## 2017-05-24 MED ORDER — INSULIN ASPART 100 UNIT/ML ~~LOC~~ SOLN: 0.0000 [IU] | Freq: Every day | SUBCUTANEOUS | Status: DC

## 2017-05-24 MED ORDER — DIPHENHYDRAMINE HCL 25 MG PO CAPS
ORAL_CAPSULE | ORAL | Status: AC
  Filled 2017-05-24: qty 1

## 2017-05-24 MED ORDER — LOSARTAN POTASSIUM 50 MG PO TABS
100.0000 mg | ORAL_TABLET | Freq: Every day | ORAL | Status: DC
  Administered 2017-05-24 – 2017-05-26 (×3): 100 mg via ORAL
  Filled 2017-05-24 (×3): qty 2

## 2017-05-24 MED ORDER — ONDANSETRON HCL 4 MG/2ML IJ SOLN: 4.0000 mg | Freq: Four times a day (QID) | INTRAMUSCULAR | Status: DC | PRN

## 2017-05-24 MED ORDER — ENOXAPARIN SODIUM 40 MG/0.4ML ~~LOC~~ SOLN
40.0000 mg | SUBCUTANEOUS | Status: DC
  Administered 2017-05-24 – 2017-05-25 (×2): 40 mg via SUBCUTANEOUS
  Filled 2017-05-24 (×2): qty 0.4

## 2017-05-24 MED ORDER — INSULIN ASPART 100 UNIT/ML ~~LOC~~ SOLN
0.0000 [IU] | Freq: Three times a day (TID) | SUBCUTANEOUS | Status: DC
  Administered 2017-05-24 (×2): 2 [IU] via SUBCUTANEOUS
  Administered 2017-05-25: 3 [IU] via SUBCUTANEOUS
  Administered 2017-05-25: 17:00:00 2 [IU] via SUBCUTANEOUS
  Administered 2017-05-25: 08:00:00 1 [IU] via SUBCUTANEOUS
  Administered 2017-05-26: 3 [IU] via SUBCUTANEOUS
  Administered 2017-05-26: 1 [IU] via SUBCUTANEOUS
  Filled 2017-05-24 (×6): qty 1

## 2017-05-24 NOTE — Progress Notes (Addendum)
[facility] [place] [name]    MR#:  [id number]  DATE OF BIRTH:  11/25/1940  SUBJECTIVE:  CHIEF COMPLAINT: Cough and shortness of breath relatively better than yesterday  REVIEW OF SYSTEMS:  CONSTITUTIONAL: No fever, fatigue or weakness.  EYES: No blurred or double vision.  EARS, NOSE, AND THROAT: No tinnitus or ear pain.  RESPIRATORY: Relatively better cough, shortness of breath, no wheezing or hemoptysis.  CARDIOVASCULAR: No chest pain, orthopnea, edema.  GASTROINTESTINAL: No nausea, vomiting, diarrhea or abdominal pain.  GENITOURINARY: No dysuria, hematuria.  ENDOCRINE: No polyuria, nocturia,  HEMATOLOGY: No anemia, easy bruising or bleeding SKIN: No rash or lesion. MUSCULOSKELETAL: No joint pain or arthritis.   NEUROLOGIC: No tingling, numbness, weakness.  PSYCHIATRY: No anxiety or depression.   DRUG ALLERGIES:   Allergies  Allergen Reactions  . Cephalexin Itching  . Contrast Media [Iodinated Diagnostic Agents] Itching  . Doxycycline Itching  . Gabapentin Itching  . Sulfa Antibiotics Itching    VITALS:  Blood pressure (!) 151/55, pulse 69, temperature 98.2 F (36.8 C), temperature source Oral, resp. rate 18, height 5\' 7"  (1.702 m), weight 83.9 kg (185 lb 1 oz), SpO2 98 %.  PHYSICAL EXAMINATION:  GENERAL:  77 y.o.-year-old patient lying in the bed with no acute distress.  EYES: Pupils equal, round, reactive to light and accommodation. No scleral icterus. Extraocular muscles intact.  HEENT: Head atraumatic, normocephalic. Oropharynx and nasopharynx clear.  NECK:  Supple, no jugular venous distention. No thyroid enlargement, no tenderness.  LUNGS: mod breath sounds bilaterally, no wheezing, rales,rhonchi or crepitation. No use of accessory muscles of respiration.  CARDIOVASCULAR: S1, S2 normal. No murmurs, rubs, or gallops.  ABDOMEN: Soft, nontender, nondistended. Bowel sounds present. No organomegaly or  mass.  EXTREMITIES: No pedal edema, cyanosis, or clubbing.  NEUROLOGIC: Cranial nerves II through XII are intact. Muscle strength 5/5 in all extremities. Sensation intact. Gait not checked.  PSYCHIATRIC: The patient is alert and oriented x 3.  SKIN: No obvious rash, lesion, or ulcer.    LABORATORY PANEL:   CBC Recent Labs  Lab 05/24/17 0531  WBC 5.5  HGB 11.7*  HCT 35.2  PLT 272   ------------------------------------------------------------------------------------------------------------------  Chemistries  Recent Labs  Lab 05/23/17 1444 05/24/17 0531  NA 139 139  K 3.7 4.1  CL 104 107  CO2 27 27  GLUCOSE 176* 133*  BUN 22* 18  CREATININE 1.25* 1.15*  CALCIUM 9.3 8.9  AST 24  --   ALT 14  --   ALKPHOS 59  --   BILITOT 0.8  --    ------------------------------------------------------------------------------------------------------------------  Cardiac Enzymes Recent Labs  Lab 05/23/17 1957  TROPONINI <0.03   ------------------------------------------------------------------------------------------------------------------  RADIOLOGY:  Ct Abdomen Pelvis Wo Contrast  Result Date: 05/23/2017 CLINICAL DATA:  Chest pain, abdominal distension EXAM: CT CHEST, ABDOMEN AND PELVIS WITHOUT CONTRAST TECHNIQUE: Multidetector CT imaging of the chest, abdomen and pelvis was performed following the standard protocol without IV contrast. COMPARISON:  Chest x-ray from earlier in the same day, ultrasound of the abdomen from 12/29/2016, CT of the chest from 07/11/2015 FINDINGS: CT CHEST FINDINGS Cardiovascular: Atherosclerotic calcifications are noted. No aneurysmal dilatation is identified. No cardiac enlargement is seen. Mitral valve calcifications are noted. Coronary calcifications are seen. No pericardial effusion is noted. Mediastinum/Nodes: Thoracic inlet is within normal limits. The esophagus is mildly dilated with air proximally although no definitive foreign body is seen.  Multiple calcified hilar and mediastinal lymph nodes are noted  consistent with prior granulomatous disease. No significant axillary adenopathy is noted. Lungs/Pleura: Moderate size right-sided pleural effusion is seen. Very mild infiltrative changes are noted in the right lower lobe. Scattered pulmonary nodules are noted. One lies in the right lower lobe measuring approximately 5 mm. This is 1 mm larger than that seen in 2017 and given the difference in imaging technique likely unchanged. Additionally a somewhat spiculated soft tissue density is noted in the right suprahilar region best seen on image number 51 of series 4 measuring 1.2 cm. This is also stable from the prior exam and as well as previous exams and consistent with a benign etiology. Musculoskeletal: Degenerative changes of the thoracic spine are noted. Postoperative changes in the cervical spine are seen. CT ABDOMEN PELVIS FINDINGS Hepatobiliary: No focal liver abnormality is seen. Status post cholecystectomy. No biliary dilatation. Pancreas: Unremarkable. No pancreatic ductal dilatation or surrounding inflammatory changes. Spleen: Normal in size without focal abnormality. Adrenals/Urinary Tract: Adrenal glands and kidneys are within normal limits. No calculi are seen. The bladder is well distended. Stomach/Bowel: Scattered mild diverticular changes noted. No obstructive changes are seen. The appendix is not well visualized. No inflammatory changes are seen. Some inspissated barium is noted in what appears to be the central portion of the appendix. Correlate with any surgical history. No obstructive or inflammatory changes are seen. Vascular/Lymphatic: Aortic atherosclerosis. No enlarged abdominal or pelvic lymph nodes. Reproductive: Status post hysterectomy. No adnexal masses. Other: No abdominal wall hernia or abnormality. No abdominopelvic ascites. Musculoskeletal: Degenerative changes of the lumbar spine are noted. IMPRESSION: Changes consistent  with prior granulomatous disease. Right-sided pleural effusion and mild right lower lobe infiltrate. Stable pulmonary nodules as described above consistent with benign etiology. Chronic changes without acute abnormality within the abdomen and pelvis. Electronically Signed   By: Inez Catalina M.D.   On: 05/23/2017 18:04   Dg Chest 2 View  Result Date: 05/23/2017 CLINICAL DATA:  Chest pain EXAM: CHEST  2 VIEW COMPARISON:  12/24/2016 FINDINGS: Low lung volumes. Bibasilar subsegmental atelectasis. Small right pleural effusion is suspected. Interstitial prominence. Normal vascularity. Chronic left rib deformities. IMPRESSION: Bibasilar atelectasis Small right pleural effusion. Electronically Signed   By: Marybelle Killings M.D.   On: 05/23/2017 15:55   Ct Chest Wo Contrast  Result Date: 05/23/2017 CLINICAL DATA:  Chest pain, abdominal distension EXAM: CT CHEST, ABDOMEN AND PELVIS WITHOUT CONTRAST TECHNIQUE: Multidetector CT imaging of the chest, abdomen and pelvis was performed following the standard protocol without IV contrast. COMPARISON:  Chest x-ray from earlier in the same day, ultrasound of the abdomen from 12/29/2016, CT of the chest from 07/11/2015 FINDINGS: CT CHEST FINDINGS Cardiovascular: Atherosclerotic calcifications are noted. No aneurysmal dilatation is identified. No cardiac enlargement is seen. Mitral valve calcifications are noted. Coronary calcifications are seen. No pericardial effusion is noted. Mediastinum/Nodes: Thoracic inlet is within normal limits. The esophagus is mildly dilated with air proximally although no definitive foreign body is seen. Multiple calcified hilar and mediastinal lymph nodes are noted consistent with prior granulomatous disease. No significant axillary adenopathy is noted. Lungs/Pleura: Moderate size right-sided pleural effusion is seen. Very mild infiltrative changes are noted in the right lower lobe. Scattered pulmonary nodules are noted. One lies in the right lower lobe  measuring approximately 5 mm. This is 1 mm larger than that seen in 2017 and given the difference in imaging technique likely unchanged. Additionally a somewhat spiculated soft tissue density is noted in the right suprahilar region best seen on image number 51 of  series 4 measuring 1.2 cm. This is also stable from the prior exam and as well as previous exams and consistent with a benign etiology. Musculoskeletal: Degenerative changes of the thoracic spine are noted. Postoperative changes in the cervical spine are seen. CT ABDOMEN PELVIS FINDINGS Hepatobiliary: No focal liver abnormality is seen. Status post cholecystectomy. No biliary dilatation. Pancreas: Unremarkable. No pancreatic ductal dilatation or surrounding inflammatory changes. Spleen: Normal in size without focal abnormality. Adrenals/Urinary Tract: Adrenal glands and kidneys are within normal limits. No calculi are seen. The bladder is well distended. Stomach/Bowel: Scattered mild diverticular changes noted. No obstructive changes are seen. The appendix is not well visualized. No inflammatory changes are seen. Some inspissated barium is noted in what appears to be the central portion of the appendix. Correlate with any surgical history. No obstructive or inflammatory changes are seen. Vascular/Lymphatic: Aortic atherosclerosis. No enlarged abdominal or pelvic lymph nodes. Reproductive: Status post hysterectomy. No adnexal masses. Other: No abdominal wall hernia or abnormality. No abdominopelvic ascites. Musculoskeletal: Degenerative changes of the lumbar spine are noted. IMPRESSION: Changes consistent with prior granulomatous disease. Right-sided pleural effusion and mild right lower lobe infiltrate. Stable pulmonary nodules as described above consistent with benign etiology. Chronic changes without acute abnormality within the abdomen and pelvis. Electronically Signed   By: Inez Catalina M.D.   On: 05/23/2017 18:04    EKG:   Orders placed or  performed during the hospital encounter of 05/23/17  . ED EKG within 10 minutes  . ED EKG within 10 minutes  . EKG 12-Lead  . EKG 12-Lead    ASSESSMENT AND PLAN:   CAP (community acquired pneumonia) - antibiotics levofloxacin and supportive treatment PRN    Pill esophagitis -PRN viscous lidocaine swish and swallow    DM (diabetes mellitus), type 2, uncontrolled, with renal complications (HCC) - sliding scale insulin with corresponding glucose checks     Essential hypertension -continue home meds Continue home medication metoprolol and Cozaar and titrate as needed    Chronic diastolic CHF (congestive heart failure) (HCC) - Not fluid overloaded continue home medications Continue Imdur, metoprolol, Cozaar Continue Brilinta Echocardiogram done today-60-65% ejection fraction    OSA on CPAP -CPAP nightly      All the records are reviewed and case discussed with Care Management/Social Workerr. Management plans discussed with the patient, family and they are in agreement.  CODE STATUS: FC   TOTAL TIME TAKING CARE OF THIS PATIENT: 36  minutes.   POSSIBLE D/C IN 1-2 DAYS, DEPENDING ON CLINICAL CONDITION.  Note: This dictation was prepared with Dragon dictation along with smaller phrase technology. Any transcriptional errors that result from this process are unintentional.   Nicholes Mango M.D on 05/24/2017 at 1:31 PM  Between 7am to 6pm - Pager - [phone] After 6pm go to [email or web address] - password [email or web address] [facility]  Office  [phone]  CC: Primary care physician; [name], MD

## 2017-05-24 NOTE — Progress Notes (Signed)
Levofloxacin was changed from 500mg  daily to 500mg  once today followed by 250mg  daily starting tomorrow due to patient's CrCl of 45.6.   Lendon Ka, PharmD Pharmacy Resident

## 2017-05-24 NOTE — Care Management Obs Status (Signed)
[facility] NOTIFICATION   Patient Details  Name: EMMALIN JAQUESS MRN: [id number] Date of Birth: April 10, 1941   Medicare Observation Status Notification Given:  Yes    Peng Thorstenson A, RN 05/24/2017, 11:44 AM

## 2017-05-24 NOTE — ED Notes (Signed)
Patient taken up to floor by Alissa, EDT

## 2017-05-24 NOTE — Progress Notes (Signed)
Spoke with pt about cpap. She prefers to start on it later tonight rather than now.

## 2017-05-24 NOTE — Progress Notes (Signed)
*  PRELIMINARY RESULTS* Echocardiogram 2D Echocardiogram has been performed.  Pamela Collier 05/24/2017, 10:28 AM

## 2017-05-25 DIAGNOSIS — E119 Type 2 diabetes mellitus without complications: Secondary | ICD-10-CM | POA: Diagnosis not present

## 2017-05-25 DIAGNOSIS — J189 Pneumonia, unspecified organism: Secondary | ICD-10-CM | POA: Diagnosis not present

## 2017-05-25 DIAGNOSIS — I10 Essential (primary) hypertension: Secondary | ICD-10-CM | POA: Diagnosis not present

## 2017-05-25 DIAGNOSIS — K208 Other esophagitis: Secondary | ICD-10-CM | POA: Diagnosis not present

## 2017-05-25 LAB — GLUCOSE, CAPILLARY
GLUCOSE-CAPILLARY: 139 mg/dL — AB (ref 65–99)
GLUCOSE-CAPILLARY: 213 mg/dL — AB (ref 65–99)
GLUCOSE-CAPILLARY: 191 mg/dL — AB (ref 65–99)
Glucose-Capillary: 190 mg/dL — ABNORMAL HIGH (ref 65–99)

## 2017-05-25 MED ORDER — GUAIFENESIN-DM 100-10 MG/5ML PO SYRP
5.0000 mL | ORAL_SOLUTION | ORAL | Status: DC | PRN
  Administered 2017-05-25: 11:00:00 5 mL via ORAL
  Filled 2017-05-25 (×2): qty 5

## 2017-05-25 MED ORDER — GLUCERNA SHAKE PO LIQD
237.0000 mL | Freq: Two times a day (BID) | ORAL | Status: DC
  Administered 2017-05-25 – 2017-05-26 (×3): 237 mL via ORAL

## 2017-05-25 MED ORDER — LEVOFLOXACIN 750 MG PO TABS
750.0000 mg | ORAL_TABLET | ORAL | Status: DC
  Administered 2017-05-25: 17:00:00 750 mg via ORAL
  Filled 2017-05-25: qty 1

## 2017-05-25 NOTE — Evaluation (Signed)
Physical Therapy Evaluation Patient Details [name] MRN: [id number] DOB: 1940/07/12 Today's Date: 05/25/2017   History of Present Illness  Pt is a is a22 y.o.femalewho presents with central chest discomfort. Patient states that she took her medicine, and shortly afterwards started having this chest discomfort. Workup in the ED largely rules out cardiac source with 3 negtroponins. Strong suspicion for pill esophagitis. However, on imaging she was also found to have right basilar pneumonia. Hospitalist were called for admission.  Assessment includes: community acquired pneumonia, pill esophagitis, DM, HTN, chronic CHF, and OSA on CPAP nightly.     Clinical Impression  Pt presents with deficits in strength, transfers, mobility, gait, balance, and activity tolerance.  Pt Mod Ind with bed mobility with extra time and effort required for all tasks but no physical assistance needed.  Pt was unable to stand without BUE assist and required repeated verbal cues for proper sequencing.  Pt able to amb 1 x 50' and 1 x 40' with RW and CGA with very slow cadence but steady without LOB.  Pt's SpO2 on room air 96-97% throughout session with HR in the low to upper 60s.  No c/o of adverse symptoms during session other than general fatigue.  Pt will benefit from HHPT services upon discharge to safely address above deficits for decreased caregiver assistance and eventual return to PLOF.      Follow Up Recommendations Home health PT    Equipment Recommendations  None recommended by PT    Recommendations for Other Services       Precautions / Restrictions Precautions Precautions: Fall Restrictions Weight Bearing Restrictions: No      Mobility  Bed Mobility Overal bed mobility: Modified Independent             General bed mobility comments: Extra time and effort for all bed mobility tasks but no physical assistance required  Transfers Overall transfer level: Needs  assistance Equipment used: Rolling walker (2 wheeled) Transfers: Sit to/from Stand Sit to Stand: Min guard         General transfer comment: Pt required mod verbal cues for sequencing during transfer attempts mostly for correct hand positioning and was unable to stand without BUE assist  Ambulation/Gait Ambulation/Gait assistance: Min guard Ambulation Distance (Feet): 50 Feet Assistive device: Rolling walker (2 wheeled) Gait Pattern/deviations: Step-through pattern;Decreased step length - right;Decreased step length - left;Trunk flexed   Gait velocity interpretation: Below normal speed for age/gender General Gait Details: Very slow cadence with amb with short B step length but steady without LOB  Stairs            Wheelchair Mobility    Modified Rankin (Stroke Patients Only)       Balance Overall balance assessment: Needs assistance Sitting-balance support: Feet supported;No upper extremity supported Sitting balance-Leahy Scale: Good     Standing balance support: Bilateral upper extremity supported Standing balance-Leahy Scale: Good                               Pertinent Vitals/Pain Pain Assessment: 0-10 Pain Score: 5  Pain Location: Back Pain Intervention(s): Limited activity within patient's tolerance;Monitored during session;Patient requesting pain meds-RN notified;RN gave pain meds during session    Home Living Family/patient expects to be discharged to:: Private residence Living Arrangements: Spouse/significant other Available Help at Discharge: Family;Available 24 hours/day Type of Home: House Home Access: Level entry     Home Layout: One level Home Equipment:  Walker - 2 wheels;Walker - 4 wheels;Cane - quad      Prior Function Level of Independence: Independent with assistive device(s)         Comments: Mod Ind with amb limited community distances mostly with a QC but occasionally with a RW, no fall history, Ind with ADLs      Hand Dominance   Dominant Hand: Right    Extremity/Trunk Assessment   Upper Extremity Assessment Upper Extremity Assessment: Generalized weakness    Lower Extremity Assessment Lower Extremity Assessment: Generalized weakness       Communication   Communication: No difficulties  Cognition Arousal/Alertness: Awake/alert Behavior During Therapy: WFL for tasks assessed/performed Overall Cognitive Status: Within Functional Limits for tasks assessed                                        General Comments      Exercises Total Joint Exercises Ankle Circles/Pumps: AROM;Both;10 reps Quad Sets: Strengthening;Both;10 reps Gluteal Sets: Strengthening;Both;10 reps Hip ABduction/ADduction: AROM;Both;5 reps Straight Leg Raises: AROM;Both;5 reps Long Arc Quad: AROM;Both;10 reps Knee Flexion: AROM;Both;10 reps Marching in Standing: AROM;Both;10 reps;Seated   Assessment/Plan    PT Assessment Patient needs continued PT services  PT Problem List Decreased strength;Decreased activity tolerance;Decreased balance;Decreased mobility       PT Treatment Interventions DME instruction;Gait training;Functional mobility training;Neuromuscular re-education;Balance training;Therapeutic exercise;Therapeutic activities;Patient/family education    PT Goals (Current goals can be found in the Care Plan section)  Acute Rehab PT Goals Patient Stated Goal: "I would like to be able to walk better" PT Goal Formulation: With patient Time For Goal Achievement: 06/07/17 Potential to Achieve Goals: Good    Frequency Min 2X/week   Barriers to discharge        Co-evaluation               AM-PAC PT "6 Clicks" Daily Activity  Outcome Measure Difficulty turning over in bed (including adjusting bedclothes, sheets and blankets)?: A Little Difficulty moving from lying on back to sitting on the side of the bed? : A Little Difficulty sitting down on and standing up from a chair  with arms (e.g., wheelchair, bedside commode, etc,.)?: Unable Help needed moving to and from a bed to chair (including a wheelchair)?: A Little Help needed walking in hospital room?: A Little Help needed climbing 3-5 steps with a railing? : A Lot 6 Click Score: 15    End of Session Equipment Utilized During Treatment: Gait belt Activity Tolerance: Patient tolerated treatment well Patient left: Other (comment)(Pt left in BR with understanding to call nsg when finished, nursing notified) Nurse Communication: Mobility status PT Visit Diagnosis: Muscle weakness (generalized) (M62.81);Difficulty in walking, not elsewhere classified (R26.2)    Time: 8675-4492 PT Time Calculation (min) (ACUTE ONLY): 26 min   Charges:   PT Evaluation $PT Eval Low Complexity: 1 Low PT Treatments $Therapeutic Exercise: 8-22 mins   PT G Codes:        DRoyetta Asal PT, DPT 05/25/17, 10:53 AM

## 2017-05-25 NOTE — Progress Notes (Signed)
[facility] [place] [name]    MR#:  [id number]  DATE OF BIRTH:  [birth year]  SUBJECTIVE:  CHIEF COMPLAINT: Cough and shortness of breath  better , sob with exertion   REVIEW OF SYSTEMS:  CONSTITUTIONAL: No fever, fatigue or weakness.  EYES: No blurred or double vision.  EARS, NOSE, AND THROAT: No tinnitus or ear pain.  RESPIRATORY:  better with cough, shortness of breath, no wheezing or hemoptysis.  CARDIOVASCULAR: No chest pain, orthopnea, edema.  GASTROINTESTINAL: No nausea, vomiting, diarrhea or abdominal pain.  GENITOURINARY: No dysuria, hematuria.  ENDOCRINE: No polyuria, nocturia,  HEMATOLOGY: No anemia, easy bruising or bleeding SKIN: No rash or lesion. MUSCULOSKELETAL: No joint pain or arthritis.   NEUROLOGIC: No tingling, numbness, weakness.  PSYCHIATRY: No anxiety or depression.   DRUG ALLERGIES:   Allergies  Allergen Reactions  . Cephalexin Itching  . Contrast Media [Iodinated Diagnostic Agents] Itching  . Doxycycline Itching  . Gabapentin Itching  . Sulfa Antibiotics Itching    VITALS:  Blood pressure (!) 124/46, pulse 66, temperature 98.6 F (37 C), temperature source Oral, resp. rate 20, height 5\' 7"  (1.702 m), weight 84.6 kg (186 lb 6.4 oz), SpO2 95 %.  PHYSICAL EXAMINATION:  GENERAL:  77 y.o.-year-old patient lying in the bed with no acute distress.  EYES: Pupils equal, round, reactive to light and accommodation. No scleral icterus. Extraocular muscles intact.  HEENT: Head atraumatic, normocephalic. Oropharynx and nasopharynx clear.  NECK:  Supple, no jugular venous distention. No thyroid enlargement, no tenderness.  LUNGS: mod breath sounds bilaterally, no wheezing, rales,rhonchi or crepitation. No use of accessory muscles of respiration.  CARDIOVASCULAR: S1, S2 normal. No murmurs, rubs, or gallops.  ABDOMEN: Soft, nontender, nondistended. Bowel sounds present. No organomegaly or mass.   EXTREMITIES: No pedal edema, cyanosis, or clubbing.  NEUROLOGIC: Cranial nerves II through XII are intact. Muscle strength 5/5 in all extremities. Sensation intact. Gait not checked.  PSYCHIATRIC: The patient is alert and oriented x 3.  SKIN: No obvious rash, lesion, or ulcer.    LABORATORY PANEL:   CBC Recent Labs  Lab 05/24/17 0531  WBC 5.5  HGB 11.7*  HCT 35.2  PLT 272   ------------------------------------------------------------------------------------------------------------------  Chemistries  Recent Labs  Lab 05/23/17 1444 05/24/17 0531  NA 139 139  K 3.7 4.1  CL 104 107  CO2 27 27  GLUCOSE 176* 133*  BUN 22* 18  CREATININE 1.25* 1.15*  CALCIUM 9.3 8.9  AST 24  --   ALT 14  --   ALKPHOS 59  --   BILITOT 0.8  --    ------------------------------------------------------------------------------------------------------------------  Cardiac Enzymes Recent Labs  Lab 05/23/17 1957  TROPONINI <0.03   ------------------------------------------------------------------------------------------------------------------  RADIOLOGY:  Ct Abdomen Pelvis Wo Contrast  Result Date: 05/23/2017 CLINICAL DATA:  Chest pain, abdominal distension EXAM: CT CHEST, ABDOMEN AND PELVIS WITHOUT CONTRAST TECHNIQUE: Multidetector CT imaging of the chest, abdomen and pelvis was performed following the standard protocol without IV contrast. COMPARISON:  Chest x-ray from earlier in the same day, ultrasound of the abdomen from 12/29/2016, CT of the chest from 07/11/2015 FINDINGS: CT CHEST FINDINGS Cardiovascular: Atherosclerotic calcifications are noted. No aneurysmal dilatation is identified. No cardiac enlargement is seen. Mitral valve calcifications are noted. Coronary calcifications are seen. No pericardial effusion is noted. Mediastinum/Nodes: Thoracic inlet is within normal limits. The esophagus is mildly dilated with air proximally although no definitive foreign body is seen. Multiple  calcified hilar and mediastinal  lymph nodes are noted consistent with prior granulomatous disease. No significant axillary adenopathy is noted. Lungs/Pleura: Moderate size right-sided pleural effusion is seen. Very mild infiltrative changes are noted in the right lower lobe. Scattered pulmonary nodules are noted. One lies in the right lower lobe measuring approximately 5 mm. This is 1 mm larger than that seen in 2017 and given the difference in imaging technique likely unchanged. Additionally a somewhat spiculated soft tissue density is noted in the right suprahilar region best seen on image number 51 of series 4 measuring 1.2 cm. This is also stable from the prior exam and as well as previous exams and consistent with a benign etiology. Musculoskeletal: Degenerative changes of the thoracic spine are noted. Postoperative changes in the cervical spine are seen. CT ABDOMEN PELVIS FINDINGS Hepatobiliary: No focal liver abnormality is seen. Status post cholecystectomy. No biliary dilatation. Pancreas: Unremarkable. No pancreatic ductal dilatation or surrounding inflammatory changes. Spleen: Normal in size without focal abnormality. Adrenals/Urinary Tract: Adrenal glands and kidneys are within normal limits. No calculi are seen. The bladder is well distended. Stomach/Bowel: Scattered mild diverticular changes noted. No obstructive changes are seen. The appendix is not well visualized. No inflammatory changes are seen. Some inspissated barium is noted in what appears to be the central portion of the appendix. Correlate with any surgical history. No obstructive or inflammatory changes are seen. Vascular/Lymphatic: Aortic atherosclerosis. No enlarged abdominal or pelvic lymph nodes. Reproductive: Status post hysterectomy. No adnexal masses. Other: No abdominal wall hernia or abnormality. No abdominopelvic ascites. Musculoskeletal: Degenerative changes of the lumbar spine are noted. IMPRESSION: Changes consistent with prior  granulomatous disease. Right-sided pleural effusion and mild right lower lobe infiltrate. Stable pulmonary nodules as described above consistent with benign etiology. Chronic changes without acute abnormality within the abdomen and pelvis. Electronically Signed   By: Inez Catalina M.D.   On: 05/23/2017 18:04   Ct Chest Wo Contrast  Result Date: 05/23/2017 CLINICAL DATA:  Chest pain, abdominal distension EXAM: CT CHEST, ABDOMEN AND PELVIS WITHOUT CONTRAST TECHNIQUE: Multidetector CT imaging of the chest, abdomen and pelvis was performed following the standard protocol without IV contrast. COMPARISON:  Chest x-ray from earlier in the same day, ultrasound of the abdomen from 12/29/2016, CT of the chest from 07/11/2015 FINDINGS: CT CHEST FINDINGS Cardiovascular: Atherosclerotic calcifications are noted. No aneurysmal dilatation is identified. No cardiac enlargement is seen. Mitral valve calcifications are noted. Coronary calcifications are seen. No pericardial effusion is noted. Mediastinum/Nodes: Thoracic inlet is within normal limits. The esophagus is mildly dilated with air proximally although no definitive foreign body is seen. Multiple calcified hilar and mediastinal lymph nodes are noted consistent with prior granulomatous disease. No significant axillary adenopathy is noted. Lungs/Pleura: Moderate size right-sided pleural effusion is seen. Very mild infiltrative changes are noted in the right lower lobe. Scattered pulmonary nodules are noted. One lies in the right lower lobe measuring approximately 5 mm. This is 1 mm larger than that seen in 2017 and given the difference in imaging technique likely unchanged. Additionally a somewhat spiculated soft tissue density is noted in the right suprahilar region best seen on image number 51 of series 4 measuring 1.2 cm. This is also stable from the prior exam and as well as previous exams and consistent with a benign etiology. Musculoskeletal: Degenerative changes of  the thoracic spine are noted. Postoperative changes in the cervical spine are seen. CT ABDOMEN PELVIS FINDINGS Hepatobiliary: No focal liver abnormality is seen. Status post cholecystectomy. No biliary dilatation. Pancreas: Unremarkable.  No pancreatic ductal dilatation or surrounding inflammatory changes. Spleen: Normal in size without focal abnormality. Adrenals/Urinary Tract: Adrenal glands and kidneys are within normal limits. No calculi are seen. The bladder is well distended. Stomach/Bowel: Scattered mild diverticular changes noted. No obstructive changes are seen. The appendix is not well visualized. No inflammatory changes are seen. Some inspissated barium is noted in what appears to be the central portion of the appendix. Correlate with any surgical history. No obstructive or inflammatory changes are seen. Vascular/Lymphatic: Aortic atherosclerosis. No enlarged abdominal or pelvic lymph nodes. Reproductive: Status post hysterectomy. No adnexal masses. Other: No abdominal wall hernia or abnormality. No abdominopelvic ascites. Musculoskeletal: Degenerative changes of the lumbar spine are noted. IMPRESSION: Changes consistent with prior granulomatous disease. Right-sided pleural effusion and mild right lower lobe infiltrate. Stable pulmonary nodules as described above consistent with benign etiology. Chronic changes without acute abnormality within the abdomen and pelvis. Electronically Signed   By: Inez Catalina M.D.   On: 05/23/2017 18:04    EKG:   Orders placed or performed during the hospital encounter of 05/23/17  . ED EKG within 10 minutes  . ED EKG within 10 minutes  . EKG 12-Lead  . EKG 12-Lead    ASSESSMENT AND PLAN:   CAP (community acquired pneumonia) - Clinically better antibiotics levofloxacin and supportive treatment PRN    Pill esophagitis -PRN viscous lidocaine swish and swallow    DM (diabetes mellitus), type 2, uncontrolled, with renal complications (HCC) - sliding scale  insulin with corresponding glucose checks     Essential hypertension -continue home meds Continue home medication metoprolol and Cozaar and titrate as needed    Chronic diastolic CHF (congestive heart failure) (HCC) - Not fluid overloaded continue home medications Continue Imdur, metoprolol, Cozaar Continue Brilinta Echocardiogram done today-60-65% ejection fraction    OSA on CPAP -CPAP nightly  PT - HHPT    All the records are reviewed and case discussed with Care Management/Social Workerr. Management plans discussed with the patient, family and they are in agreement.  CODE STATUS: FC   TOTAL TIME TAKING CARE OF THIS PATIENT: 36  minutes.   POSSIBLE D/C IN 1-2 DAYS, DEPENDING ON CLINICAL CONDITION.  Note: This dictation was prepared with Dragon dictation along with smaller phrase technology. Any transcriptional errors that result from this process are unintentional.   Nicholes Mango M.D on 05/25/2017 at 4:08 PM  Between 7am to 6pm - Pager - [phone] After 6pm go to [email or web address] - password [email or web address] [facility]  Office  [phone]  CC: Primary care physician; [name], MD

## 2017-05-25 NOTE — Progress Notes (Signed)
Pharmacist - Prescriber Communication  Per [employer] approved protocols (IV to PO and Renal Dosing of antibiotics) levofloxacin has been modified to 750 mg po Q48H.   Pamela Collier A. [place]uck[place], [place].D., BCPS Clinical Pharmacist 05/25/2017 08:10

## 2017-05-26 DIAGNOSIS — Z8249 Family history of ischemic heart disease and other diseases of the circulatory system: Secondary | ICD-10-CM | POA: Diagnosis not present

## 2017-05-26 DIAGNOSIS — E1122 Type 2 diabetes mellitus with diabetic chronic kidney disease: Secondary | ICD-10-CM | POA: Diagnosis present

## 2017-05-26 DIAGNOSIS — I255 Ischemic cardiomyopathy: Secondary | ICD-10-CM | POA: Diagnosis present

## 2017-05-26 DIAGNOSIS — G4733 Obstructive sleep apnea (adult) (pediatric): Secondary | ICD-10-CM | POA: Diagnosis present

## 2017-05-26 DIAGNOSIS — Z8673 Personal history of transient ischemic attack (TIA), and cerebral infarction without residual deficits: Secondary | ICD-10-CM | POA: Diagnosis not present

## 2017-05-26 DIAGNOSIS — J440 Chronic obstructive pulmonary disease with acute lower respiratory infection: Secondary | ICD-10-CM | POA: Diagnosis present

## 2017-05-26 DIAGNOSIS — I2510 Atherosclerotic heart disease of native coronary artery without angina pectoris: Secondary | ICD-10-CM | POA: Diagnosis present

## 2017-05-26 DIAGNOSIS — J189 Pneumonia, unspecified organism: Secondary | ICD-10-CM | POA: Diagnosis present

## 2017-05-26 DIAGNOSIS — K208 Other esophagitis: Secondary | ICD-10-CM | POA: Diagnosis present

## 2017-05-26 DIAGNOSIS — Z89421 Acquired absence of other right toe(s): Secondary | ICD-10-CM | POA: Diagnosis not present

## 2017-05-26 DIAGNOSIS — Z7982 Long term (current) use of aspirin: Secondary | ICD-10-CM | POA: Diagnosis not present

## 2017-05-26 DIAGNOSIS — F418 Other specified anxiety disorders: Secondary | ICD-10-CM | POA: Diagnosis present

## 2017-05-26 DIAGNOSIS — Z90710 Acquired absence of both cervix and uterus: Secondary | ICD-10-CM | POA: Diagnosis not present

## 2017-05-26 DIAGNOSIS — R0789 Other chest pain: Secondary | ICD-10-CM | POA: Diagnosis not present

## 2017-05-26 DIAGNOSIS — Z79899 Other long term (current) drug therapy: Secondary | ICD-10-CM | POA: Diagnosis not present

## 2017-05-26 DIAGNOSIS — I5032 Chronic diastolic (congestive) heart failure: Secondary | ICD-10-CM | POA: Diagnosis not present

## 2017-05-26 DIAGNOSIS — Z794 Long term (current) use of insulin: Secondary | ICD-10-CM | POA: Diagnosis not present

## 2017-05-26 DIAGNOSIS — Z9861 Coronary angioplasty status: Secondary | ICD-10-CM | POA: Diagnosis not present

## 2017-05-26 DIAGNOSIS — Z9049 Acquired absence of other specified parts of digestive tract: Secondary | ICD-10-CM | POA: Diagnosis not present

## 2017-05-26 DIAGNOSIS — M810 Age-related osteoporosis without current pathological fracture: Secondary | ICD-10-CM | POA: Diagnosis present

## 2017-05-26 DIAGNOSIS — Z7951 Long term (current) use of inhaled steroids: Secondary | ICD-10-CM | POA: Diagnosis not present

## 2017-05-26 DIAGNOSIS — N183 Chronic kidney disease, stage 3 (moderate): Secondary | ICD-10-CM | POA: Diagnosis present

## 2017-05-26 DIAGNOSIS — E1151 Type 2 diabetes mellitus with diabetic peripheral angiopathy without gangrene: Secondary | ICD-10-CM | POA: Diagnosis present

## 2017-05-26 DIAGNOSIS — I130 Hypertensive heart and chronic kidney disease with heart failure and stage 1 through stage 4 chronic kidney disease, or unspecified chronic kidney disease: Secondary | ICD-10-CM | POA: Diagnosis present

## 2017-05-26 DIAGNOSIS — E785 Hyperlipidemia, unspecified: Secondary | ICD-10-CM | POA: Diagnosis present

## 2017-05-26 DIAGNOSIS — I10 Essential (primary) hypertension: Secondary | ICD-10-CM | POA: Diagnosis not present

## 2017-05-26 LAB — GLUCOSE, CAPILLARY
GLUCOSE-CAPILLARY: 148 mg/dL — AB (ref 65–99)
GLUCOSE-CAPILLARY: 223 mg/dL — AB (ref 65–99)

## 2017-05-26 MED ORDER — INSULIN LISPRO PROT & LISPRO (75-25 MIX) 100 UNIT/ML KWIKPEN: 30.0000 [IU] | PEN_INJECTOR | Freq: Two times a day (BID) | SUBCUTANEOUS | 11 refills | Status: DC

## 2017-05-26 MED ORDER — LEVOFLOXACIN 750 MG PO TABS: 750.0000 mg | ORAL_TABLET | ORAL | 0 refills | Status: DC

## 2017-05-26 MED ORDER — GUAIFENESIN-DM 100-10 MG/5ML PO SYRP: 10.0000 mL | ORAL_SOLUTION | Freq: Four times a day (QID) | ORAL | 0 refills | Status: DC | PRN

## 2017-05-26 MED ORDER — GLUCERNA SHAKE PO LIQD: 237.0000 mL | Freq: Two times a day (BID) | ORAL | 0 refills | Status: DC

## 2017-05-26 NOTE — Care Management Note (Signed)
Case Management Note  Patient Details  [name] MRN: [id number] Date of Birth: 1941-01-08  Subjective/Objective:  Admitted to [facility] with the diagnosis of pneumonia under observation status. Lives with husband, [name] [phone]). Last seen Dr, Derrel Nip 12/24/2016. Prescriptions are filled at [facility] on [address].   Home Health per [place] last June 2018. [facility] and [employer] in the past. No home oxygen. Uses CPAP at night. Rolling walker, cane, raised toilet seat and rails in the home. Takes care of all basic activities of daily living herself, drives. Last fall was many years ago. Weight and appetite goes up and down.   Husband will transport               Action/Plan: Physical therapy evaluation completed. Recommending home with home health and therapy. [place]. Floydene Flock., [place] representative updated   Expected Discharge Date:  05/26/17               Expected Discharge Plan:     In-House Referral:   yes  Discharge planning Services   yes  Post Acute Care Choice:   yes Choice offered to:   patient  DME Arranged:    DME Agency:     HH Arranged:   yes HH Agency:   [facility]  Status of Service:     If discussed at [place] of Stay Meetings, dates discussed:    Additional Comments:  Shelbie Ammons, RN MSN CCM Care Management [phone] 05/26/2017, 11:20 AM

## 2017-05-26 NOTE — Discharge Instructions (Addendum)
Follow-up with primary care physician in 1 week Home health PT

## 2017-05-26 NOTE — Discharge Planning (Signed)
Patient IV removed.  RN assessment and VS revealed stability for DC to home with [facility]. Discharge papers given, explained and educated.  Informed of suggested FU appt and appt made.  Insulin e-scribed to pharm - rest of scripts printed and given to patient.  Once ready, will be wheeled to front and family transporting home via car. Ride should be here between 2-3pm - waiting on ride to arrive.

## 2017-05-26 NOTE — Discharge Summary (Signed)
[facility] [name]    MR#:  [id number]  DATE OF BIRTH:  1941-01-03  DATE OF ADMISSION:  05/23/2017 ADMITTING PHYSICIAN: Lance Coon, MD  DATE OF DISCHARGE: 05/26/17  PRIMARY CARE PHYSICIAN: [name], MD    ADMISSION DIAGNOSIS:  Pill esophagitis [K20.8] Nonspecific chest pain [R07.9] Pneumonia of right lower lobe due to infectious organism ([place]) [J18.1]  DISCHARGE DIAGNOSIS:  Principal Problem:   CAP (community acquired pneumonia) Active Problems:   DM (diabetes mellitus), type 2, uncontrolled, with renal complications ([place])   Depression with anxiety   Essential hypertension   Chronic diastolic CHF (congestive heart failure) (HCC)   OSA on CPAP   CAD in native artery   Pill esophagitis   Pneumonia   SECONDARY DIAGNOSIS:   Past Medical History:  Diagnosis Date  . Acute respiratory failure ([place]) 11/21/2014  . Anxiety   . Arthritis 08/05/2013  . Atherosclerotic peripheral vascular disease with gangrene [facility]) august 2012  . Cardiomyopathy, ischemic    a. EF 35 to 40% by echo in 2013 b. EF improved to 50-55% by echo in 04/2015; 11/2016 Echo: EF 55-60%, Gr1 DD, mildly dil LA, nl RV fxn.  . Cerebral infarct ([place]) 08/17/2013  . Chronic diastolic CHF (congestive heart failure) (HCC)    a. EF 50-55% by echo in 04/2015; b. 11/2016 Echo: EF 55-60%, Gr1 DD.  [name] COPD (chronic obstructive pulmonary disease) ([place])   . Coronary artery disease, occlusive    a. Previous PCI to the LAD, LCx, and RCA in 2010, 2011, 2013, and 2016. All performed in [place]; b. 11/2016 Lexiscan MV: EF 43%, no ischemia->low risk.  . 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 Diabetic diarrhea ([place]) 10/03/2014  . DM type 2, uncontrolled, with renal complications ([place]) 4/0/9811  . Hepatic steatosis    by CT abd pelvis  . History of kidney stones    at a younger age  . Hyperlipidemia LDL goal <100 02/23/2014  . Hypertensive heart disease   .  Osteoporosis, post-menopausal   . 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 Pleural effusion 10/25/2012   10/2012 CT chest >> small to moderate R lung effusion>> chylothorax, 100% lymphs 10/2013 thoracentesis> cytology negative, WBC 1471, > 90% "small lymphs" 01/2014 CT chest> near complete resolution of pleural effusion, stable lymphadenopathy 08/2014 CT chest [place] ([facility]): small right sided effusion decreased in size, stable mediastinal lymphadenopathy 1.0cm largest, 14m  . 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 Sleep apnea   . Stage III chronic kidney disease [facility])     HOSPITAL COURSE:   HPI  Pamela Collier  is a 77 y.o. female who presents with central chest discomfort.  Patient states that she took her medicine, and shortly afterwards started having this chest discomfort.  Workup here in the ED largely rules out cardiac source with 3 neg troponins.  Strong suspicion for pill esophagitis.  However, on imaging she was also found to have right basilar pneumonia.  Hospitalist were called for admission   CAP (community acquired pneumonia) - Clinically better antibiotics levofloxacin and supportive treatment  PRN D/C home   Pill esophagitis -PRN viscous lidocaine swish and swallow  DM (diabetes mellitus), type 2, uncontrolled, with renal complications (HCC) - sliding scale insulin with corresponding glucose checks   Essential hypertension -continue home meds Continue home medication metoprolol and Cozaar and titrate as needed  Chronic diastolic CHF (congestive heart failure) (HCC) - Not fluid overloaded continue home medications Continue  Imdur, metoprolol, Cozaar Continue Brilinta Echocardiogram done today-60-65% ejection fraction  OSA on CPAP -CPAP nightly  PT - [facility]PT-[facility] home [facility]      DISCHARGE CONDITIONS:   Stable   CONSULTS OBTAINED:     PROCEDURES  None   DRUG ALLERGIES:   Allergies  Allergen Reactions  . Cephalexin Itching  . Contrast Media [Iodinated Diagnostic Agents] Itching  . Doxycycline Itching  . Gabapentin Itching  . Sulfa Antibiotics Itching    DISCHARGE MEDICATIONS:   Allergies as of 05/26/2017      Reactions   Cephalexin Itching   Contrast Media [iodinated Diagnostic Agents] Itching   Doxycycline Itching   Gabapentin Itching   Sulfa Antibiotics Itching      Medication List    TAKE these medications   acetaminophen 500 MG tablet Commonly known as:  TYLENOL Take 1,000 mg by mouth every 6 (six) hours as needed for mild pain or headache.   alendronate 70 MG tablet Commonly known as:  FOSAMAX Take 1 tablet (70 mg total) by mouth once a week. Pt takes on Sunday.   Take with a full glass of water on an empty stomach.   ALPRAZolam 0.25 MG tablet Commonly known as:  XANAX Take 1 tablet (0.25 mg total) by mouth daily.   aspirin EC 81 MG tablet Take 81 mg by mouth daily.   B-D UF III MINI PEN NEEDLES 31G X 5 MM Misc Generic drug:  Insulin Pen Needle USE THREE TIMES A DAY   BRILINTA 90 MG Tabs tablet Generic drug:  ticagrelor Take 1 tablet (90 mg total) by mouth 2 (two) times daily.   budesonide-formoterol 160-4.5 MCG/ACT inhaler Commonly known as:  SYMBICORT Inhale 2 puffs into the lungs 2 (two) times daily.   diclofenac sodium 1 % Gel Commonly known as:  VOLTAREN Apply 2 g topically 4 (four) times daily.   docusate sodium 100 MG capsule Commonly known as:  COLACE Take 100 mg by mouth 2 (two) times daily as needed for mild constipation. Reported on 11/09/2015   feeding supplement (GLUCERNA SHAKE) Liqd Take 237 mLs by mouth 2 (two) times daily between  meals. Start taking on:  05/27/2017   fluticasone 50 MCG/ACT nasal spray Commonly known as:  FLONASE Place 2 sprays into both nostrils daily as needed for rhinitis.   furosemide 20 MG tablet Commonly known as:  LASIX Take 1 tablet (20 mg total) by mouth daily. What changed:  when to take this   glucose blood test strip Commonly known as:  ACCU-CHEK AVIVA PLUS USE TO CHECK GLUCOSE  THREE TIMES DAILY   guaiFENesin-dextromethorphan 100-10 MG/5ML syrup Commonly known as:  ROBITUSSIN DM Take 10 mLs by mouth every 6 (six) hours as needed for cough.   HYDROcodone-acetaminophen 5-325 MG tablet Commonly known as:  NORCO/VICODIN Take 1-2 tablets by mouth every 4 (four) hours as needed (breakthrough pain).   Insulin Lispro Prot & Lispro (75-25) 100 UNIT/ML Kwikpen Commonly known as:  HUMALOG MIX 75/25 KWIKPEN Inject 30 Units into the skin 2 (two) times daily with a meal. 44 units in the morning and 60 units in  the evening What changed:  how much to take   isosorbide mononitrate 30 MG 24 hr tablet Commonly known as:  IMDUR TAKE 1 TABLET DAILY   levofloxacin 750 MG tablet Commonly known as:  LEVAQUIN Take 1 tablet (750 mg total) by mouth every other day. Start taking on:  05/27/2017   losartan 100 MG tablet Commonly known as:  COZAAR TAKE 1 TABLET DAILY   LYRICA 100 MG capsule Generic drug:  pregabalin TAKE ONE CAPSULE BY MOUTH THREE TIMES DAILY   metoprolol succinate 25 MG 24 hr tablet Commonly known as:  TOPROL-XL Take 1 tablet (25 mg total) by mouth daily.   nitroGLYCERIN 0.4 MG/SPRAY spray Commonly known as:  NITROLINGUAL Place 1 spray under the tongue every 5 (five) minutes x 3 doses as needed for chest pain.   ondansetron 4 MG tablet Commonly known as:  ZOFRAN Take 1 tablet (4 mg total) by mouth every 8 (eight) hours as needed for nausea or vomiting.   PARoxetine 20 MG tablet Commonly known as:  PAXIL TAKE ONE TABLET BY MOUTH ONCE DAILY   polyethylene glycol  packet Commonly known as:  MIRALAX / GLYCOLAX Take 17 g by mouth daily as needed for mild constipation. Reported on 10/25/2015   rosuvastatin 20 MG tablet Commonly known as:  CRESTOR Take 20 mg by mouth daily.   traMADol 50 MG tablet Commonly known as:  ULTRAM Take 1 tablet (50 mg total) by mouth every 6 (six) hours as needed for moderate pain.        DISCHARGE INSTRUCTIONS:   Follow-up with primary care physician in 1 week Home health PT  DIET:  Cardiac diet and Diabetic diet  DISCHARGE CONDITION:  Fair  ACTIVITY:  Activity as tolerated  OXYGEN:  Home Oxygen: No.   Oxygen Delivery: room air  DISCHARGE LOCATION:  home   If you experience worsening of your admission symptoms, develop shortness of breath, life threatening emergency, suicidal or homicidal thoughts you must seek medical attention immediately by calling 911 or calling your MD immediately  if symptoms less severe.  You Must read complete instructions/literature along with all the possible adverse reactions/side effects for all the Medicines you take and that have been prescribed to you. Take any new Medicines after you have completely understood and accpet all the possible adverse reactions/side effects.   Please note  You were cared for by a hospitalist during your hospital stay. If you have any questions about your discharge medications or the care you received while you were in the hospital after you are discharged, you can call the unit and asked to speak with the hospitalist on call if the hospitalist that took care of you is not available. Once you are discharged, your primary care physician will handle any further medical issues. Please note that NO REFILLS for any discharge medications will be authorized once you are discharged, as it is imperative that you return to your primary care physician (or establish a relationship with a primary care physician if you do not have one) for your aftercare needs so  that they can reassess your need for medications and monitor your lab values.     Today  Chief Complaint  Patient presents with  . Chest Pain   Patient is feeling much better.  Cough and shortness of breath improved.  Wants to go home.  Denies any other complaints.  ROS:  CONSTITUTIONAL: Denies fevers, chills. Denies any fatigue, weakness.  EYES: Denies blurry vision, double vision, eye  pain. EARS, NOSE, THROAT: Denies tinnitus, ear pain, hearing loss. RESPIRATORY: Denies cough, wheeze, shortness of breath.  CARDIOVASCULAR: Denies chest pain, palpitations, edema.  GASTROINTESTINAL: Denies nausea, vomiting, diarrhea, abdominal pain. Denies bright red blood per rectum. GENITOURINARY: Denies dysuria, hematuria. ENDOCRINE: Denies nocturia or thyroid problems. HEMATOLOGIC AND LYMPHATIC: Denies easy bruising or bleeding. SKIN: Denies rash or lesion. MUSCULOSKELETAL: Denies pain in neck, back, shoulder, knees, hips or arthritic symptoms.  NEUROLOGIC: Denies paralysis, paresthesias.  PSYCHIATRIC: Denies anxiety or depressive symptoms.   VITAL SIGNS:  Blood pressure (!) 145/52, pulse 64, temperature 98.2 F (36.8 C), temperature source Oral, resp. rate 14, height 5\' 7"  (1.702 m), weight 85 kg (187 lb 4.8 oz), SpO2 100 %.  I/O:    Intake/Output Summary (Last 24 hours) at 05/26/2017 1325 Last data filed at 05/26/2017 0900 Gross per 24 hour  Intake 600 ml  Output -  Net 600 ml    PHYSICAL EXAMINATION:  GENERAL:  77 y.o.-year-old patient lying in the bed with no acute distress.  EYES: Pupils equal, round, reactive to light and accommodation. No scleral icterus. Extraocular muscles intact.  HEENT: Head atraumatic, normocephalic. Oropharynx and nasopharynx clear.  NECK:  Supple, no jugular venous distention. No thyroid enlargement, no tenderness.  LUNGS: Normal breath sounds bilaterally, no wheezing, rales,rhonchi or crepitation. No use of accessory muscles of respiration.   CARDIOVASCULAR: S1, S2 normal. No murmurs, rubs, or gallops.  ABDOMEN: Soft, non-tender, non-distended. Bowel sounds present. No organomegaly or mass.  EXTREMITIES: No pedal edema, cyanosis, or clubbing.  NEUROLOGIC: Cranial nerves II through XII are intact. Muscle strength 5/5 in all extremities. Sensation intact. Gait not checked.  PSYCHIATRIC: The patient is alert and oriented x 3.  SKIN: No obvious rash, lesion, or ulcer.   DATA REVIEW:   CBC Recent Labs  Lab 05/24/17 0531  WBC 5.5  HGB 11.7*  HCT 35.2  PLT 272    Chemistries  Recent Labs  Lab 05/23/17 1444 05/24/17 0531  NA 139 139  K 3.7 4.1  CL 104 107  CO2 27 27  GLUCOSE 176* 133*  BUN 22* 18  CREATININE 1.25* 1.15*  CALCIUM 9.3 8.9  AST 24  --   ALT 14  --   ALKPHOS 59  --   BILITOT 0.8  --     Cardiac Enzymes Recent Labs  Lab 05/23/17 1957  TROPONINI <0.03    Microbiology Results  Results for orders placed or performed during the hospital encounter of 10/08/16  Surgical pcr screen     Status: None   Collection Time: 10/08/16  4:08 PM  Result Value Ref Range Status   MRSA, PCR NEGATIVE NEGATIVE Final   Staphylococcus aureus NEGATIVE NEGATIVE Final    Comment:        The Xpert SA Assay (FDA approved for NASAL specimens in patients over 64 years of age), is one component of a comprehensive surveillance program.  Test performance has been validated by [facility] for patients greater than or equal to 72 year old. It is not intended to diagnose infection nor to guide or monitor treatment.     RADIOLOGY:  Ct Abdomen Pelvis Wo Contrast  Result Date: 05/23/2017 CLINICAL DATA:  Chest pain, abdominal distension EXAM: CT CHEST, ABDOMEN AND PELVIS WITHOUT CONTRAST TECHNIQUE: Multidetector CT imaging of the chest, abdomen and pelvis was performed following the standard protocol without IV contrast. COMPARISON:  Chest x-ray from earlier in the same day, ultrasound of the abdomen from 12/29/2016, CT  of the  chest from 07/11/2015 FINDINGS: CT CHEST FINDINGS Cardiovascular: Atherosclerotic calcifications are noted. No aneurysmal dilatation is identified. No cardiac enlargement is seen. Mitral valve calcifications are noted. Coronary calcifications are seen. No pericardial effusion is noted. Mediastinum/Nodes: Thoracic inlet is within normal limits. The esophagus is mildly dilated with air proximally although no definitive foreign body is seen. Multiple calcified hilar and mediastinal lymph nodes are noted consistent with prior granulomatous disease. No significant axillary adenopathy is noted. Lungs/Pleura: Moderate size right-sided pleural effusion is seen. Very mild infiltrative changes are noted in the right lower lobe. Scattered pulmonary nodules are noted. One lies in the right lower lobe measuring approximately 5 mm. This is 1 mm larger than that seen in 2017 and given the difference in imaging technique likely unchanged. Additionally a somewhat spiculated soft tissue density is noted in the right suprahilar region best seen on image number 51 of series 4 measuring 1.2 cm. This is also stable from the prior exam and as well as previous exams and consistent with a benign etiology. Musculoskeletal: Degenerative changes of the thoracic spine are noted. Postoperative changes in the cervical spine are seen. CT ABDOMEN PELVIS FINDINGS Hepatobiliary: No focal liver abnormality is seen. Status post cholecystectomy. No biliary dilatation. Pancreas: Unremarkable. No pancreatic ductal dilatation or surrounding inflammatory changes. Spleen: Normal in size without focal abnormality. Adrenals/Urinary Tract: Adrenal glands and kidneys are within normal limits. No calculi are seen. The bladder is well distended. Stomach/Bowel: Scattered mild diverticular changes noted. No obstructive changes are seen. The appendix is not well visualized. No inflammatory changes are seen. Some inspissated barium is noted in what appears to  be the central portion of the appendix. Correlate with any surgical history. No obstructive or inflammatory changes are seen. Vascular/Lymphatic: Aortic atherosclerosis. No enlarged abdominal or pelvic lymph nodes. Reproductive: Status post hysterectomy. No adnexal masses. Other: No abdominal wall hernia or abnormality. No abdominopelvic ascites. Musculoskeletal: Degenerative changes of the lumbar spine are noted. IMPRESSION: Changes consistent with prior granulomatous disease. Right-sided pleural effusion and mild right lower lobe infiltrate. Stable pulmonary nodules as described above consistent with benign etiology. Chronic changes without acute abnormality within the abdomen and pelvis. Electronically Signed   By: Inez Catalina M.D.   On: 05/23/2017 18:04   Dg Chest 2 View  Result Date: 05/23/2017 CLINICAL DATA:  Chest pain EXAM: CHEST  2 VIEW COMPARISON:  12/24/2016 FINDINGS: Low lung volumes. Bibasilar subsegmental atelectasis. Small right pleural effusion is suspected. Interstitial prominence. Normal vascularity. Chronic left rib deformities. IMPRESSION: Bibasilar atelectasis Small right pleural effusion. Electronically Signed   By: Marybelle Killings M.D.   On: 05/23/2017 15:55   Ct Chest Wo Contrast  Result Date: 05/23/2017 CLINICAL DATA:  Chest pain, abdominal distension EXAM: CT CHEST, ABDOMEN AND PELVIS WITHOUT CONTRAST TECHNIQUE: Multidetector CT imaging of the chest, abdomen and pelvis was performed following the standard protocol without IV contrast. COMPARISON:  Chest x-ray from earlier in the same day, ultrasound of the abdomen from 12/29/2016, CT of the chest from 07/11/2015 FINDINGS: CT CHEST FINDINGS Cardiovascular: Atherosclerotic calcifications are noted. No aneurysmal dilatation is identified. No cardiac enlargement is seen. Mitral valve calcifications are noted. Coronary calcifications are seen. No pericardial effusion is noted. Mediastinum/Nodes: Thoracic inlet is within normal limits. The  esophagus is mildly dilated with air proximally although no definitive foreign body is seen. Multiple calcified hilar and mediastinal lymph nodes are noted consistent with prior granulomatous disease. No significant axillary adenopathy is noted. Lungs/Pleura: Moderate size right-sided pleural effusion is seen.  Very mild infiltrative changes are noted in the right lower lobe. Scattered pulmonary nodules are noted. One lies in the right lower lobe measuring approximately 5 mm. This is 1 mm larger than that seen in 2017 and given the difference in imaging technique likely unchanged. Additionally a somewhat spiculated soft tissue density is noted in the right suprahilar region best seen on image number 51 of series 4 measuring 1.2 cm. This is also stable from the prior exam and as well as previous exams and consistent with a benign etiology. Musculoskeletal: Degenerative changes of the thoracic spine are noted. Postoperative changes in the cervical spine are seen. CT ABDOMEN PELVIS FINDINGS Hepatobiliary: No focal liver abnormality is seen. Status post cholecystectomy. No biliary dilatation. Pancreas: Unremarkable. No pancreatic ductal dilatation or surrounding inflammatory changes. Spleen: Normal in size without focal abnormality. Adrenals/Urinary Tract: Adrenal glands and kidneys are within normal limits. No calculi are seen. The bladder is well distended. Stomach/Bowel: Scattered mild diverticular changes noted. No obstructive changes are seen. The appendix is not well visualized. No inflammatory changes are seen. Some inspissated barium is noted in what appears to be the central portion of the appendix. Correlate with any surgical history. No obstructive or inflammatory changes are seen. Vascular/Lymphatic: Aortic atherosclerosis. No enlarged abdominal or pelvic lymph nodes. Reproductive: Status post hysterectomy. No adnexal masses. Other: No abdominal wall hernia or abnormality. No abdominopelvic ascites.  Musculoskeletal: Degenerative changes of the lumbar spine are noted. IMPRESSION: Changes consistent with prior granulomatous disease. Right-sided pleural effusion and mild right lower lobe infiltrate. Stable pulmonary nodules as described above consistent with benign etiology. Chronic changes without acute abnormality within the abdomen and pelvis. Electronically Signed   By: Inez Catalina M.D.   On: 05/23/2017 18:04    EKG:   Orders placed or performed during the hospital encounter of 05/23/17  . ED EKG within 10 minutes  . ED EKG within 10 minutes  . EKG 12-Lead  . EKG 12-Lead      Management plans discussed with the patient, family and they are in agreement.  CODE STATUS:     Code Status Orders  (From admission, onward)        Start     Ordered   05/24/17 0130  Full code  Continuous     05/24/17 0129    Code Status History    Date Active Date Inactive Code Status Order ID Comments User Context   11/14/2016 22:49 11/15/2016 18:29 Full Code [id number]  Harvie Bridge, DO ED   10/10/2016 13:25 10/13/2016 20:20 Full Code [id number]  Earnie Larsson, MD Inpatient   06/11/2016 21:42 06/12/2016 19:23 Full Code [id number]  Harvie Bridge, DO ED   01/02/2016 05:24 01/02/2016 09:14 Full Code [id number]  Saundra Shelling, MD Inpatient   06/26/2015 07:36 06/29/2015 19:54 Full Code [id number]  Demetrios Loll, MD ED   04/20/2015 01:18 04/22/2015 19:18 Full Code [id number]  Lance Coon, MD Inpatient    Advance Directive Documentation     Most Recent Value  Type of Advance Directive  Living will  Pre-existing out of facility DNR order (yellow form or pink MOST form)  No data  "MOST" Form in Place?  No data      TOTAL TIME TAKING CARE OF THIS PATIENT: 45  minutes.   Note: This dictation was prepared with Dragon dictation along with smaller phrase technology. Any transcriptional errors that result from this process are unintentional.   @[facility] @  on 05/26/2017 at 1:25 PM  Between 7am to 6pm -  Pager -  [phone]  After 6pm go to [email or web address] - password [email or web address]  Office  [phone]  CC: Primary care physician; [name], MD

## 2017-05-27 ENCOUNTER — Telehealth: Payer: Self-pay | Admitting: Internal Medicine

## 2017-05-27 NOTE — Telephone Encounter (Signed)
First attempt made for Transitional care management from hospital. Message left for patient to return call to office.

## 2017-05-28 NOTE — Telephone Encounter (Signed)
Transition Care Management Follow-up Telephone Call  How have you been since you were released from the hospital? Patient stated she feels pretty good just tired .   Do you understand why you were in the hospital? yes   Do you understand the discharge instrcutions? yes  Items Reviewed:  Medications reviewed: yes  Allergies reviewed: yes  Dietary changes reviewed: yes  Referrals reviewed: yes   Functional Questionnaire:   Activities of Daily Living (ADLs):   She states they are independent in the following: ambulation, bathing and hygiene, feeding, continence, grooming, toileting and dressing States they require assistance with the following: Afraid to get in the tub, sponges off.   Any transportation issues/concerns?: no   Any patient concerns? no   Confirmed importance and date/time of follow-up visits scheduled: yes   Confirmed with patient if condition begins to worsen call PCP or go to the ER.  Patient was given the Call-a-Nurse line [phone]: yes

## 2017-06-02 ENCOUNTER — Other Ambulatory Visit: Payer: Self-pay | Admitting: Internal Medicine

## 2017-06-03 ENCOUNTER — Other Ambulatory Visit: Payer: Self-pay

## 2017-06-03 ENCOUNTER — Emergency Department: Payer: Medicare Other

## 2017-06-03 ENCOUNTER — Observation Stay
Admission: EM | Admit: 2017-06-03 | Discharge: 2017-06-04 | Disposition: A | Discharge status: Home / Self Care | Payer: Medicare Other | Attending: Internal Medicine | Admitting: Internal Medicine

## 2017-06-03 ENCOUNTER — Ambulatory Visit: Payer: Medicare Other

## 2017-06-03 ENCOUNTER — Encounter: Payer: Self-pay | Admitting: Emergency Medicine

## 2017-06-03 ENCOUNTER — Ambulatory Visit (INDEPENDENT_AMBULATORY_CARE_PROVIDER_SITE_OTHER)
Admission: EM | Admit: 2017-06-03 | Discharge: 2017-06-03 | Disposition: A | Discharge status: Other Acute Inpatient Hospital | Payer: Medicare Other | Source: Home / Self Care

## 2017-06-03 ENCOUNTER — Ambulatory Visit: Payer: Self-pay | Admitting: *Deleted

## 2017-06-03 DIAGNOSIS — E1129 Type 2 diabetes mellitus with other diabetic kidney complication: Secondary | ICD-10-CM | POA: Diagnosis present

## 2017-06-03 DIAGNOSIS — E785 Hyperlipidemia, unspecified: Secondary | ICD-10-CM | POA: Diagnosis not present

## 2017-06-03 DIAGNOSIS — R0789 Other chest pain: Secondary | ICD-10-CM | POA: Diagnosis not present

## 2017-06-03 DIAGNOSIS — F418 Other specified anxiety disorders: Secondary | ICD-10-CM | POA: Insufficient documentation

## 2017-06-03 DIAGNOSIS — Z8249 Family history of ischemic heart disease and other diseases of the circulatory system: Secondary | ICD-10-CM | POA: Insufficient documentation

## 2017-06-03 DIAGNOSIS — Z79899 Other long term (current) drug therapy: Secondary | ICD-10-CM | POA: Insufficient documentation

## 2017-06-03 DIAGNOSIS — E1121 Type 2 diabetes mellitus with diabetic nephropathy: Secondary | ICD-10-CM | POA: Insufficient documentation

## 2017-06-03 DIAGNOSIS — K760 Fatty (change of) liver, not elsewhere classified: Secondary | ICD-10-CM | POA: Insufficient documentation

## 2017-06-03 DIAGNOSIS — I130 Hypertensive heart and chronic kidney disease with heart failure and stage 1 through stage 4 chronic kidney disease, or unspecified chronic kidney disease: Secondary | ICD-10-CM | POA: Diagnosis not present

## 2017-06-03 DIAGNOSIS — N183 Chronic kidney disease, stage 3 (moderate): Secondary | ICD-10-CM | POA: Diagnosis not present

## 2017-06-03 DIAGNOSIS — Z794 Long term (current) use of insulin: Secondary | ICD-10-CM | POA: Insufficient documentation

## 2017-06-03 DIAGNOSIS — Z9989 Dependence on other enabling machines and devices: Secondary | ICD-10-CM | POA: Insufficient documentation

## 2017-06-03 DIAGNOSIS — E1122 Type 2 diabetes mellitus with diabetic chronic kidney disease: Secondary | ICD-10-CM | POA: Insufficient documentation

## 2017-06-03 DIAGNOSIS — M1990 Unspecified osteoarthritis, unspecified site: Secondary | ICD-10-CM | POA: Diagnosis not present

## 2017-06-03 DIAGNOSIS — G4733 Obstructive sleep apnea (adult) (pediatric): Secondary | ICD-10-CM | POA: Diagnosis not present

## 2017-06-03 DIAGNOSIS — Z8673 Personal history of transient ischemic attack (TIA), and cerebral infarction without residual deficits: Secondary | ICD-10-CM | POA: Diagnosis not present

## 2017-06-03 DIAGNOSIS — Z888 Allergy status to other drugs, medicaments and biological substances status: Secondary | ICD-10-CM | POA: Diagnosis not present

## 2017-06-03 DIAGNOSIS — I5032 Chronic diastolic (congestive) heart failure: Secondary | ICD-10-CM | POA: Insufficient documentation

## 2017-06-03 DIAGNOSIS — K5730 Diverticulosis of large intestine without perforation or abscess without bleeding: Secondary | ICD-10-CM | POA: Insufficient documentation

## 2017-06-03 DIAGNOSIS — R531 Weakness: Secondary | ICD-10-CM

## 2017-06-03 DIAGNOSIS — R918 Other nonspecific abnormal finding of lung field: Secondary | ICD-10-CM

## 2017-06-03 DIAGNOSIS — Z882 Allergy status to sulfonamides status: Secondary | ICD-10-CM | POA: Insufficient documentation

## 2017-06-03 DIAGNOSIS — I2510 Atherosclerotic heart disease of native coronary artery without angina pectoris: Secondary | ICD-10-CM | POA: Diagnosis not present

## 2017-06-03 DIAGNOSIS — Z7982 Long term (current) use of aspirin: Secondary | ICD-10-CM | POA: Insufficient documentation

## 2017-06-03 DIAGNOSIS — E1165 Type 2 diabetes mellitus with hyperglycemia: Secondary | ICD-10-CM

## 2017-06-03 DIAGNOSIS — D860 Sarcoidosis of lung: Secondary | ICD-10-CM | POA: Insufficient documentation

## 2017-06-03 DIAGNOSIS — R079 Chest pain, unspecified: Secondary | ICD-10-CM | POA: Diagnosis not present

## 2017-06-03 DIAGNOSIS — Z89421 Acquired absence of other right toe(s): Secondary | ICD-10-CM | POA: Insufficient documentation

## 2017-06-03 DIAGNOSIS — I255 Ischemic cardiomyopathy: Secondary | ICD-10-CM | POA: Insufficient documentation

## 2017-06-03 DIAGNOSIS — J449 Chronic obstructive pulmonary disease, unspecified: Secondary | ICD-10-CM | POA: Insufficient documentation

## 2017-06-03 DIAGNOSIS — Z881 Allergy status to other antibiotic agents status: Secondary | ICD-10-CM | POA: Insufficient documentation

## 2017-06-03 DIAGNOSIS — E1151 Type 2 diabetes mellitus with diabetic peripheral angiopathy without gangrene: Secondary | ICD-10-CM | POA: Diagnosis not present

## 2017-06-03 DIAGNOSIS — I452 Bifascicular block: Secondary | ICD-10-CM | POA: Insufficient documentation

## 2017-06-03 DIAGNOSIS — R05 Cough: Secondary | ICD-10-CM | POA: Diagnosis not present

## 2017-06-03 DIAGNOSIS — IMO0002 Reserved for concepts with insufficient information to code with codable children: Secondary | ICD-10-CM | POA: Diagnosis present

## 2017-06-03 LAB — CBC WITH DIFFERENTIAL/PLATELET
Basophils Absolute: 0.1 10*3/uL (ref 0–0.1)
Basophils Relative: 1 %
EOS ABS: 0.1 10*3/uL (ref 0–0.7)
EOS PCT: 2 %
HCT: 34.3 % — ABNORMAL LOW (ref 35.0–47.0)
Hemoglobin: 11.6 g/dL — ABNORMAL LOW (ref 12.0–16.0)
LYMPHS ABS: 1.3 10*3/uL (ref 1.0–3.6)
LYMPHS PCT: 22 %
MCH: 29.1 pg (ref 26.0–34.0)
MCHC: 33.8 g/dL (ref 32.0–36.0)
MCV: 86.1 fL (ref 80.0–100.0)
MONO ABS: 0.7 10*3/uL (ref 0.2–0.9)
MONOS PCT: 12 %
Neutro Abs: 3.7 10*3/uL (ref 1.4–6.5)
Neutrophils Relative %: 63 %
PLATELETS: 259 10*3/uL (ref 150–440)
RBC: 3.98 MIL/uL (ref 3.80–5.20)
RDW: 16.2 % — AB (ref 11.5–14.5)
WBC: 6 10*3/uL (ref 3.6–11.0)

## 2017-06-03 LAB — COMPREHENSIVE METABOLIC PANEL
ALBUMIN: 3.3 g/dL — AB (ref 3.5–5.0)
ALT: 11 U/L — AB (ref 14–54)
AST: 17 U/L (ref 15–41)
Alkaline Phosphatase: 58 U/L (ref 38–126)
Anion gap: 6 (ref 5–15)
BUN: 32 mg/dL — AB (ref 6–20)
CHLORIDE: 106 mmol/L (ref 101–111)
CO2: 25 mmol/L (ref 22–32)
CREATININE: 1.55 mg/dL — AB (ref 0.44–1.00)
Calcium: 9 mg/dL (ref 8.9–10.3)
GFR calc Af Amer: 36 mL/min — ABNORMAL LOW (ref >60)
GFR, EST NON AFRICAN AMERICAN: 31 mL/min — AB (ref >60)
GLUCOSE: 109 mg/dL — AB (ref 65–99)
POTASSIUM: 4.2 mmol/L (ref 3.5–5.1)
Sodium: 137 mmol/L (ref 135–145)
Total Bilirubin: 0.6 mg/dL (ref 0.3–1.2)
Total Protein: 7.4 g/dL (ref 6.5–8.1)

## 2017-06-03 LAB — CBC
HEMATOCRIT: 36.3 % (ref 35.0–47.0)
Hemoglobin: 12 g/dL (ref 12.0–16.0)
MCH: 29 pg (ref 26.0–34.0)
MCHC: 33.1 g/dL (ref 32.0–36.0)
MCV: 87.7 fL (ref 80.0–100.0)
Platelets: 287 10*3/uL (ref 150–440)
RBC: 4.15 MIL/uL (ref 3.80–5.20)
RDW: 16.1 % — AB (ref 11.5–14.5)
WBC: 6.9 10*3/uL (ref 3.6–11.0)

## 2017-06-03 LAB — BASIC METABOLIC PANEL
Anion gap: 7 (ref 5–15)
BUN: 30 mg/dL — AB (ref 6–20)
CO2: 25 mmol/L (ref 22–32)
CREATININE: 1.54 mg/dL — AB (ref 0.44–1.00)
Calcium: 9.2 mg/dL (ref 8.9–10.3)
Chloride: 106 mmol/L (ref 101–111)
GFR calc Af Amer: 36 mL/min — ABNORMAL LOW (ref >60)
GFR, EST NON AFRICAN AMERICAN: 31 mL/min — AB (ref >60)
Glucose, Bld: 75 mg/dL (ref 65–99)
Potassium: 4 mmol/L (ref 3.5–5.1)
SODIUM: 138 mmol/L (ref 135–145)

## 2017-06-03 LAB — GLUCOSE, CAPILLARY
GLUCOSE-CAPILLARY: 69 mg/dL (ref 65–99)
Glucose-Capillary: 103 mg/dL — ABNORMAL HIGH (ref 65–99)

## 2017-06-03 LAB — TROPONIN I: Troponin I: <0.03 ng/mL (ref <0.03)

## 2017-06-03 IMAGING — CR DG CHEST 2V
1 series · 2 of 2 positions shown · non-contrast
Comparison: 01/11/2015

CLINICAL DATA: Cough, weakness, shortness of Breath.

EXAM:
CHEST  2 VIEW

[Series 1: w chest pa · 0.14mm/px · 2 of 2 slices shown]
[im 1/2]
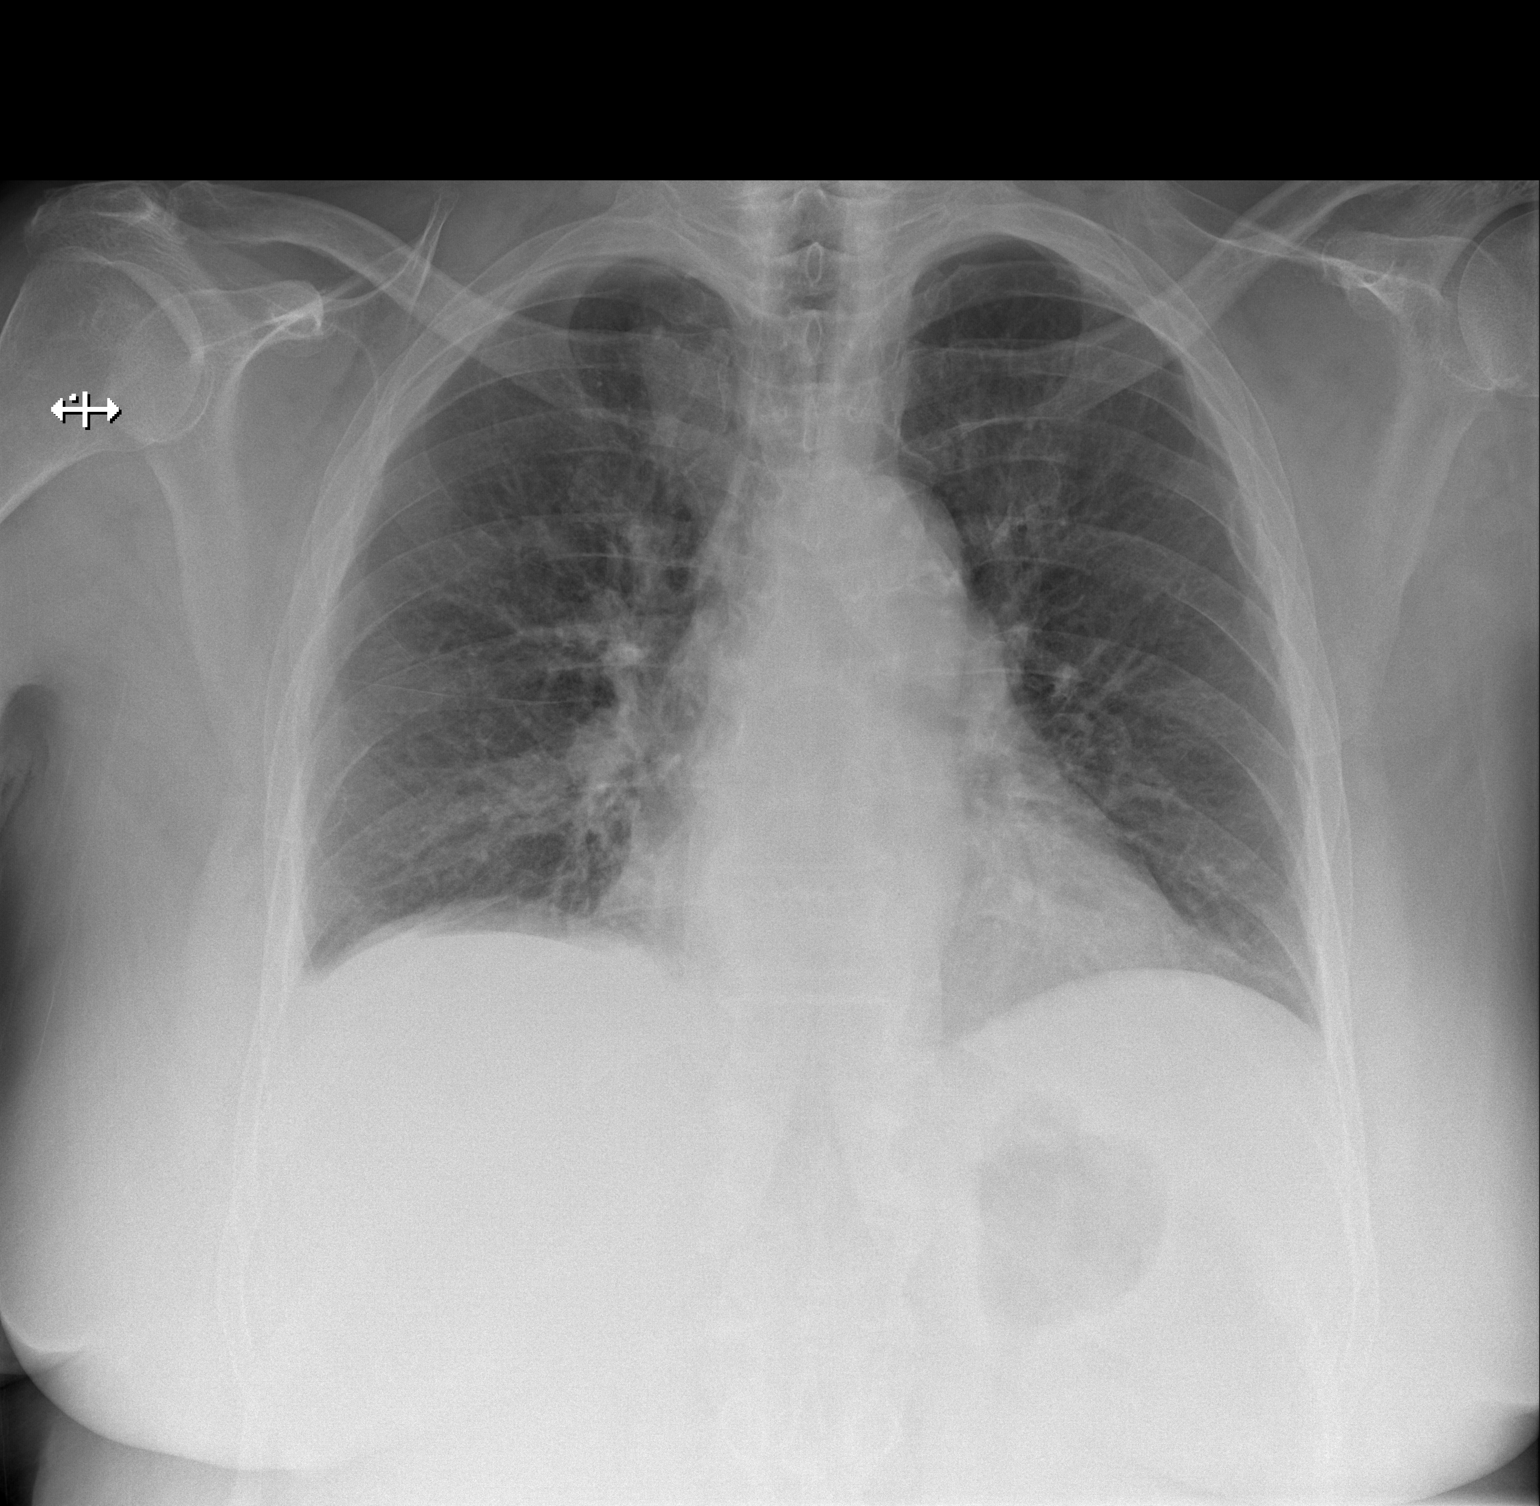
[im 2/2]
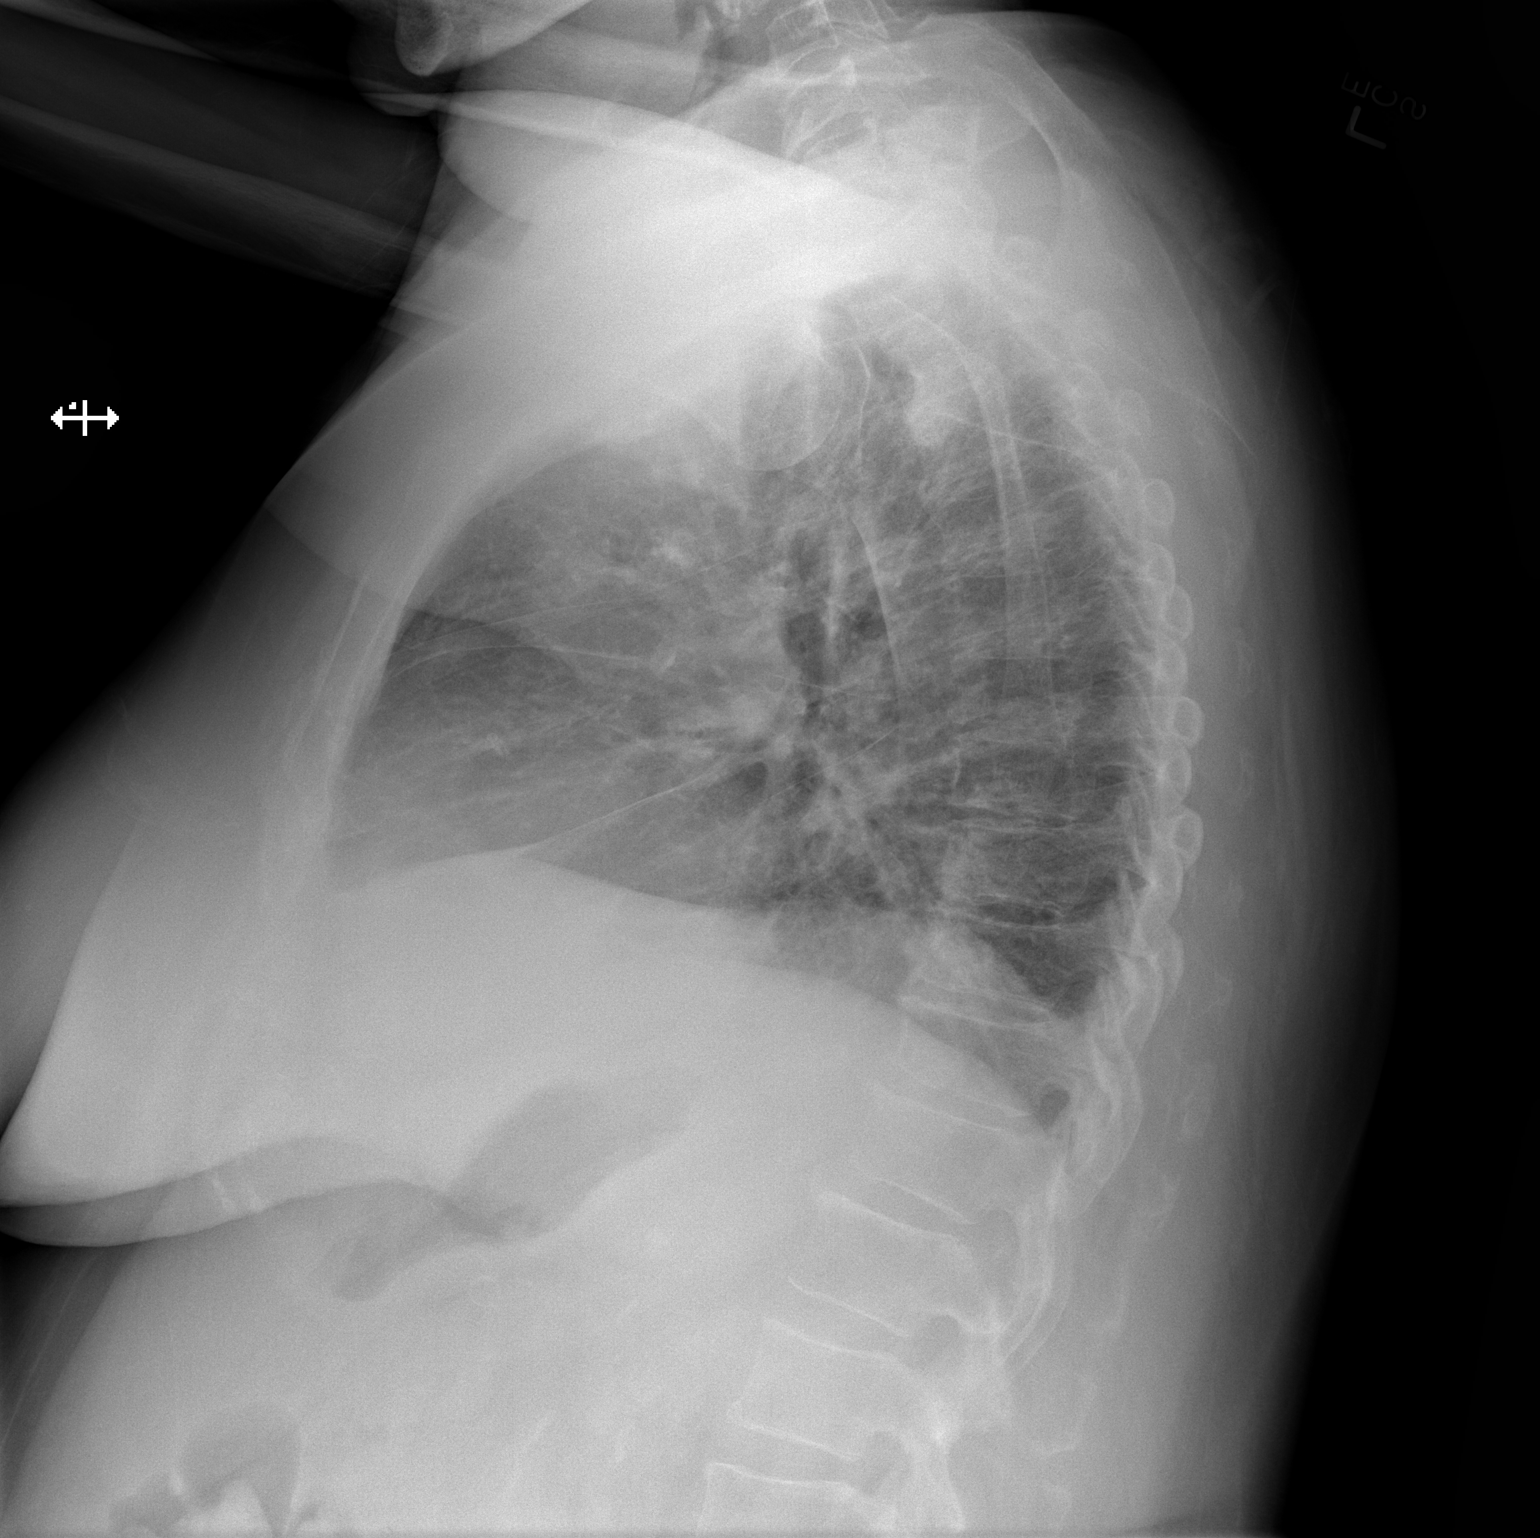

[2 of 2 positions shown; findings below may reference images not displayed]

FINDINGS: Heart is normal size. Aorta is normal caliber. No confluent airspace
opacities or effusions. No acute bony abnormality. Old left rib
fractures.
IMPRESSION: No active cardiopulmonary disease.

## 2017-06-03 MED ORDER — SODIUM CHLORIDE 0.9 % IV BOLUS (SEPSIS)
1000.0000 mL | Freq: Once | INTRAVENOUS | Status: AC
  Administered 2017-06-03: 1000 mL via INTRAVENOUS

## 2017-06-03 MED ORDER — LIDOCAINE VISCOUS 2 % MT SOLN
15.0000 mL | Freq: Once | OROMUCOSAL | Status: AC
  Administered 2017-06-03: 15 mL via OROMUCOSAL
  Filled 2017-06-03: qty 15

## 2017-06-03 MED ORDER — FAMOTIDINE 20 MG PO TABS
20.0000 mg | ORAL_TABLET | Freq: Once | ORAL | Status: AC
  Administered 2017-06-03: 20 mg via ORAL
  Filled 2017-06-03: qty 1

## 2017-06-03 MED ORDER — ALUM & MAG HYDROXIDE-SIMETH 200-200-20 MG/5ML PO SUSP
15.0000 mL | Freq: Once | ORAL | Status: AC
  Administered 2017-06-03: 15 mL via ORAL
  Filled 2017-06-03: qty 30

## 2017-06-03 NOTE — ED Notes (Signed)
Patient transported to CT 

## 2017-06-03 NOTE — Telephone Encounter (Signed)
Pt recently discharged from hospital on 05/27/17 with diagnosis of pneumonia; on Sunday 05/31/17 and was coughing up white phglem and was taking antbitotics; she took the last antibiotics on 06/02/17; pt states that she "shakes sometimes anyway' but on Monday she started shaking bad and could not hold a plate or spoon; she states that she is most concerned about her chills, sweating at night and cough; pt states that she is out of cough medicine and she is still coughing mostly at night and in the morning;  Fransisco Beau at [facility] request that pt be triaged; nurse triage initiated and recommendation made for pt to go to ED; will route to [facility] for notification of pt disposition; she and her husband verbalize understanding; Reason for Disposition . Patient sounds very sick or weak to the triager  Answer Assessment - Initial Assessment Questions 1. SYMPTOM: "What's the main symptom you're concerned about?" (e.g., breathing difficulty, fever, weakness)     Fever, chills, shaking, intermittent chest pain (chest pain started 3 days ago but none today; "says is not sharp")  2. ONSET: "When did the  ________  start?"    06/01/17 3. BETTER-SAME-WORSE: "Are you getting better, staying the same, or getting worse compared to the day you were discharged?"     worse 4. HOSPITALIZATION: "How long were you hospitalized?" (e.g., days)    5 days 5. DISCHARGE DATE: "What date were you discharged from the hospital?"     05/27/17 6. DISCHARGE DOCTOR: "Who is the main doctor taking care of you now?"     Dr Derrel Nip 7. DISCHARGE APPOINTMENT: "Have you scheduled a follow-up discharge appointment with your doctor?"     Dr Derrel Nip 06/08/17 8. DISCHARGE MEDICATIONS: "Did the physician who discharged you order any new medications for you to use? If yes, have you filled the prescription and started taking the medication?"     yes 9. DISCHARGE ANTIBIOTICS: "Are you taking any antibiotic medication now?"     levaquin 750 mg  every other day 10. BREATHING DIFFICULTY: "Are you having any difficulty breathing?" If so, ask "How bad is it?"  (e.g., none, mild, moderate, severe)   - MILD: No SOB at rest, mild SOB with walking, speaks normally in sentences, can lay down, no retractions, pulse < 100.    - MODERATE: SOB at rest, SOB with minimal exertion and prefers to sit, cannot lie down flat, speaks in phrases, mild retractions, audible wheezing, pulse 100-120.    - SEVERE: Very SOB at rest, speaks in single words, struggling to breathe, sitting hunched forward, retractions, pulse > 120        none 11. FEVER: "Do you have a fever?" If so, ask: "What is it, how was it measured and when did it start?"       no 12. OTHER SYMPTOMS: "Do you have any other symptoms?" (e.g., weakness, confusion, pain)       Weakness, chills, sweating at night, intermittent chest pain (described as "not sharp"; non today)  Protocols used: PNEUMONIA ON ANTIBIOTIC POST-HOSPITALIZATION FOLLOW-UP CALL-A-AH

## 2017-06-03 NOTE — Telephone Encounter (Signed)
FYI

## 2017-06-03 NOTE — ED Triage Notes (Signed)
Pt c/o chest pain that started at the Urgent Care when they stood her to ambulate - pain located over left breast - pain is 9/10 - denies N/V

## 2017-06-03 NOTE — ED Notes (Signed)
Patient given orange juice and graham crackers and peanut butter.

## 2017-06-03 NOTE — ED Provider Notes (Signed)
[facility]    CSN: [id number] Arrival date & time: 06/03/17  1258     History   Chief Complaint Chief Complaint  Patient presents with  . Cough  . Weakness    HPI Pamela Collier is a 77 y.o. female.   [name], with complex medical histories, came in today for weakness.  Patient was recently discharged from hospital on 05/27/17 with diagnosis of pneumonia. She took the last antibiotics on 06/02/17.  Patient was advised by triage nurse at his PCP office to go to the Emergency Department but instead came to the Urgent Care. She reports feeling very weak and states that she "shakes real bad". Patient reports that she normally  "shakes" but not this bad. She has no shortness of breath. She does have Chest pain that is intermittent but not currently. She is still having productive coughing. She also endorses some abdominal pain at times. No headache, no dizziness.         Past Medical History:  Diagnosis Date  . Acute respiratory failure ([place]) 11/21/2014  . Anxiety   . Arthritis 08/05/2013  . Atherosclerotic peripheral vascular disease with gangrene [facility]) august 2012  . Cardiomyopathy, ischemic    a. EF 35 to 40% by echo in 2013 b. EF improved to 50-55% by echo in 04/2015; 11/2016 Echo: EF 55-60%, Gr1 DD, mildly dil LA, nl RV fxn.  . Cerebral infarct ([place]) 08/17/2013  . Chronic diastolic CHF (congestive heart failure) (HCC)    a. EF 50-55% by echo in 04/2015; b. 11/2016 Echo: EF 55-60%, Gr1 DD.  [name] COPD (chronic obstructive pulmonary disease) ([place])   . Coronary artery disease, occlusive    a. Previous PCI to the LAD, LCx, and RCA in 2010, 2011, 2013, and 2016. All performed in [place]; b. 11/2016 Lexiscan MV: EF 43%, no ischemia->low risk.  . 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 Diabetic diarrhea ([place]) 10/03/2014  . DM type 2, uncontrolled, with renal complications ([place]) 08/11/7060  . Hepatic steatosis    by CT abd pelvis  . History of kidney stones    at a younger age  .  Hyperlipidemia LDL goal <100 02/23/2014  . Hypertensive heart disease   . Osteoporosis, post-menopausal   . 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 Pleural effusion 10/25/2012   10/2012 CT chest >> small to moderate R lung effusion>> chylothorax, 100% lymphs 10/2013 thoracentesis> cytology negative, WBC 1471, > 90% "small lymphs" 01/2014 CT chest> near complete resolution of pleural effusion, stable lymphadenopathy 08/2014 CT chest [place] ([facility]): small right sided effusion decreased in size, stable mediastinal lymphadenopathy 1.0cm largest, 58m . 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 Sleep apnea   . Stage III chronic kidney disease ([facility]     Patient Active Problem List   Diagnosis Date Noted  . Pneumonia 05/26/2017  . Pill esophagitis 05/23/2017  . Abnormal weight gain 12/23/2016  . Chronic kidney disease (CKD), stage I 11/15/2016  . History of fall 11/12/2016  . Osteoarthritis 11/10/2016  . Cervical stenosis of spinal canal 10/10/2016  . Cervical radiculopathy at C7 07/02/2016  . Acute  midline thoracic back pain 07/02/2016  . Epigastric hernia 04/01/2016  . Umbilical pain 53/64/6803  . CAD in native artery   . Type 2 DM with CKD stage 3 and hypertension ([place])   . Chronic renal insufficiency   . Shortness of breath 09/12/2015  . Hypertensive heart disease   . OSA on CPAP 07/07/2015  . Hospital discharge follow-up 07/07/2015  . Chest pain with low risk of acute coronary syndrome 06/29/2015  . CAP (community acquired pneumonia)   . Pleural effusion, right   . Chronic diastolic CHF (congestive heart failure) ([place]) 04/18/2015  . Diabetic  neuropathy ([place]) 02/21/2015  . Benign neoplasm of sigmoid colon   . Low back pain with radiation 04/04/2014  . Essential hypertension 04/04/2014  . Hyperlipidemia 02/23/2014  . Long-term use of high-risk medication 02/23/2014  . Dermatophytic onychia 08/17/2013  . Stable angina ([place]) 08/17/2013  . Cerebral infarct ([place]) 08/17/2013  . Bony exostosis 08/17/2013  . Arthritis 08/05/2013  . History of colonic polyps 02/20/2013  . Sarcoidosis ([place]) 12/07/2012  . Dysphagia 12/07/2012  . Nocturnal cough 10/25/2012  . Pulmonary nodule seen on imaging study 05/20/2012  . 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 Cardiomyopathy, ischemic   . Hepatic steatosis   . Osteoporosis, post-menopausal   . Peripheral vascular disease due to secondary diabetes mellitus ([place])   . DM (diabetes mellitus), type 2, uncontrolled, with renal complications ([place]) 21/22/4825  . Double vessel coronary artery disease 12/14/2011    Past Surgical History:  Procedure Laterality Date  . ABDOMINAL HYSTERECTOMY     at ge 39. secondary to bleeding/partial  . ANTERIOR CERVICAL DECOMP/DISCECTOMY FUSION N/A 10/10/2016   Procedure: Anterior Cervical Discectomy Fusion - Cervical four4- five - Cervical five-Cervical six - Cervical six-Cervical seven;  Surgeon: Earnie Larsson, MD;  Location: [facility];  Service: Neurosurgery;  Laterality: N/A;  . BREAST CYST ASPIRATION Right   . CARPAL TUNNEL RELEASE Bilateral   . CHOLECYSTECTOMY     in [place]   . COLONOSCOPY WITH PROPOFOL N/A 01/09/2015   Procedure: COLONOSCOPY WITH PROPOFOL;  Surgeon: Lucilla Lame, MD;  Location: [facility] ENDOSCOPY;  Service: Endoscopy;  Laterality: N/A;  . CORONARY ANGIOPLASTY WITH STENT PLACEMENT     [place]; [facility]  . EYE SURGERY     bil cataracts  . HERNIA REPAIR     umbilical/ Dr Pat Patrick  . PTCA  August 2012   Right Posterior tibial artery , [place]  . 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 Authorizing Provider  acetaminophen (TYLENOL) 500 MG tablet Take 1,000 mg by mouth every 6 (six) hours as needed for mild pain or headache.    Yes [provider]  alendronate (FOSAMAX) 70 MG tablet Take 1 tablet (70 mg total) by mouth once a week. Pt takes on Sunday.   Take with a full glass of water on an empty stomach. 07/01/16  Yes  [name], MD  ALPRAZolam [name]) 0.25 MG tablet Take 1 tablet (0.25 mg total) by mouth daily. 11/10/16  Yes [name], MD  aspirin EC 81 MG tablet Take 81 mg by mouth daily.   Yes [provider]  BRILINTA 90 MG TABS tablet Take 1 tablet (90 mg total) by mouth 2 (two) times daily. 03/05/17  Yes [name], MD  budesonide-formoterol (SYMBICORT) 160-4.5 MCG/ACT inhaler Inhale 2 puffs into the lungs 2 (two) times daily. 02/23/15  Yes Juanito Doom, MD  docusate sodium (COLACE) 100 MG capsule Take 100 mg by mouth 2 (two) times daily as needed for mild constipation. Reported on 11/09/2015   Yes [provider]  feeding supplement, GLUCERNA SHAKE, (GLUCERNA SHAKE) LIQD Take 237 mLs by mouth 2 (two) times daily between meals. 05/27/17  Yes Gouru, Illene Silver, MD  fluticasone (FLONASE) 50 MCG/ACT nasal spray Place 2 sprays into both nostrils daily as needed for rhinitis.   Yes [provider]  furosemide (LASIX) 20 MG tablet Take 1 tablet (20 mg total) by mouth daily. Patient taking differently: Take 20 mg by mouth every other day.  06/11/16  Yes [name], MD  Insulin Lispro Prot & Lispro (HUMALOG MIX 75/25 KWIKPEN) (75-25) 100 UNIT/ML Kwikpen Inject 30 Units into the skin 2 (two) times daily with a  meal. 44 units in the morning and 60 units in the evening 05/26/17  Yes Gouru, [place], MD  isosorbide mononitrate (IMDUR) 30 MG 24 hr tablet TAKE 1 TABLET DAILY 04/01/17  Yes [name], MD  losartan (COZAAR) 100 MG tablet TAKE 1 TABLET DAILY 12/10/16  Yes [name], MD  LYRICA 100 MG capsule TAKE ONE CAPSULE BY MOUTH THREE TIMES DAILY 11/27/16  Yes [name], MD  metoprolol succinate (TOPROL-XL) 25 MG 24 hr tablet Take 1 tablet (25 mg total) by mouth daily. 06/18/16  Yes [name], MD  ondansetron (ZOFRAN) 4 MG tablet Take 1 tablet (4 mg total) by mouth every 8 (eight) hours as needed for nausea or vomiting. 05/18/16  Yes Alfred Levins, [place], MD  PARoxetine (PAXIL) 20 MG tablet TAKE ONE TABLET BY MOUTH ONCE DAILY 06/25/16  Yes [name], MD  polyethylene glycol (MIRALAX / GLYCOLAX) packet Take 17 g by mouth daily as needed for mild constipation. Reported on 10/25/2015   Yes [provider]  rosuvastatin (CRESTOR) 20 MG tablet Take 20 mg by mouth daily.    Yes [provider]  B-D UF III MINI PEN NEEDLES 31G X 5 MM MISC USE THREE TIMES A DAY 03/09/17   [name], MD  diclofenac sodium (VOLTAREN) 1 % GEL Apply 2 g topically 4 (four) times daily.    [provider]  glucose blood (ACCU-CHEK AVIVA PLUS) test strip USE TO CHECK GLUCOSE  THREE TIMES DAILY 04/29/16   [name], MD  guaiFENesin-dextromethorphan (ROBITUSSIN DM) 100-10 MG/5ML syrup Take 10 mLs by mouth every 6 (six) hours as needed for cough. 05/26/17   Nicholes Mango, MD  HYDROcodone-acetaminophen (NORCO/VICODIN) 5-325 MG tablet Take 1-2 tablets by mouth every 4 (four) hours as needed (breakthrough pain). 10/13/16   Earnie Larsson, MD  levofloxacin (LEVAQUIN) 750 MG tablet Take 1 tablet (750 mg total) by mouth every other day. 05/27/17   Nicholes Mango, MD  nitroGLYCERIN (NITROLINGUAL) 0.4 MG/SPRAY spray Place 1 spray under the tongue every 5 (five) minutes x 3 doses as needed for chest pain.  01/14/17   Deborra Medina  L, MD  traMADol (ULTRAM) 50 MG tablet Take 1 tablet (50 mg total) by mouth every 6 (six) hours as needed for moderate pain. 01/23/17   [name], MD    Family History Family History  Problem Relation Age of Onset  . Heart disease Father   . Heart attack Father   . Heart disease Sister   . Heart attack Sister   . Heart disease Brother   . Heart attack Brother   . Asthma Grandchild   . Breast cancer Maternal Aunt     Social History Social History   Tobacco Use  . Smoking status: Never Smoker  . Smokeless tobacco: Never Used  Substance Use Topics  . Alcohol use: No    Alcohol/week: 0.0 oz  . Drug use: No     Allergies   Cephalexin; Contrast media [iodinated diagnostic agents]; Doxycycline; Gabapentin; and Sulfa antibiotics   Review of Systems Review of Systems  Constitutional: Positive for fatigue. Negative for chills and fever.  HENT: Positive for rhinorrhea. Negative for congestion, sinus pressure and sinus pain.   Respiratory: Positive for cough. Negative for shortness of breath and wheezing.   Cardiovascular: Positive for chest pain. Negative for palpitations.  Gastrointestinal: Negative for abdominal pain, nausea and vomiting.  Skin: Negative for rash.  Neurological: Negative for dizziness and headaches.     Physical Exam Triage Vital Signs ED Triage Vitals [06/03/17 1313]  Enc Vitals Group     BP (!) 118/50     Pulse Rate 73     Resp 16     Temp 98.2 F (36.8 C)     Temp Source Oral     SpO2 99 %     Weight 180 lb (81.6 kg)     Height _0  (1.702 m)     Head Circumference      Peak Flow      Pain Score 5     Pain Loc      Pain Edu?      Excl. in [place]?    No data found.  Updated Vital Signs BP (!) 118/50 (BP Location: Left Arm)   Pulse 73   Temp 98.2 F (36.8 C) (Oral)   Resp 16   Ht _1  (1.702 m)   Wt 180 lb (81.6 kg)   SpO2 97% Comment: during short period of time walking  BMI 28.19 kg/m   Visual  Acuity Right Eye Distance:   Left Eye Distance:   Bilateral Distance:    Right Eye Near:   Left Eye Near:    Bilateral Near:     Physical Exam   UC Treatments / Results  Labs (all labs ordered are listed, but only abnormal results are displayed) Labs Reviewed  COMPREHENSIVE METABOLIC PANEL - Abnormal; Notable for the following components:      Result Value   Glucose, Bld 109 (*)    BUN 32 (*)    Creatinine, Ser 1.55 (*)    Albumin 3.3 (*)    ALT 11 (*)    GFR calc non Af Amer 31 (*)    GFR calc Af Amer 36 (*)    All other components within normal limits  CBC WITH DIFFERENTIAL/PLATELET - Abnormal; Notable for the following components:   Hemoglobin 11.6 (*)    HCT 34.3 (*)    RDW 16.2 (*)    All other components within normal limits    EKG  EKG Interpretation None  Radiology Dg Chest 2 View  Result Date: 06/03/2017 CLINICAL DATA:  Cough and congestion. Left-sided chest pain patient was recently diagnosed with pneumonia. EXAM: CHEST  2 VIEW COMPARISON:  05/23/2017; 12/24/2016; 05/16/2016; chest CT-05/23/2017 FINDINGS: Grossly unchanged cardiac silhouette and mediastinal contours with partially calcified bilateral hilar and mediastinal lymph nodes. Overall improved aeration of the lungs with persistent bilateral infrahilar opacities favored to represent atelectasis. Suspected reduction in residual trace right-sided pleural effusion. No definite left-sided pleural effusion. No pneumothorax. There is a minimal amount of pleuroparenchymal thickening about the right minor fissure, unchanged. No evidence of edema. No acute osseous abnormalities. Post lower cervical ACDF, incompletely evaluated. Old fracture involving the posterolateral aspect the left fifth rib. Mild (approximately 25%) compression deformity involving the superior endplates of the P54 and T12 vertebral bodies, unchanged. IMPRESSION: Overall improved aeration of lungs with suspected trace residual right-sided  effusion and associated bibasilar opacities, likely atelectasis. Electronically Signed   By: Sandi Mariscal M.D.   On: 06/03/2017 14:50    Procedures Procedures (including critical care time)  Medications Ordered in UC Medications - No data to display   Initial Impression / Assessment and Plan / UC Course  I have reviewed the triage vital signs and the nursing notes.  Pertinent labs & imaging results that were available during my care of the patient were reviewed by me and considered in my medical decision making (see chart for details).  15:30: Got patient up to walk with her cane; patient reports chest pain during ambulation. She did maintained good oxygen saturation.    Final Clinical Impressions(s) / UC Diagnoses   Final diagnoses:  Weakness  Opacities of both lungs present on chest x-ray  Other chest pain   Chest xray shows trace residual right-sided effusion and associated bibasilar opacities, likely atelectasis. And labs shows chronic renal insufficiency however her eGFR is lower than her normal baseline. WBC unremarkable.   Suspecting that her previous pneumonia has not completely resolved. Given her weak appearance and her inability to walk and her lack of good care at home, I feel more comfortable sending her to the hospital for further evaluation and possible admission.   AVS and visit summary printed for patient to take to the Emergency Room.   ED Discharge Orders    None       Controlled Substance Prescriptions [place] Controlled Substance Registry consulted? Not Applicable   Barry Dienes, NP 06/03/17 1659

## 2017-06-03 NOTE — ED Triage Notes (Signed)
Patient in today c/o cough and weakness. Patient was recently hospitialized with pneumonia. Patient was admitted on 05/23/17 and discharged 05/26/17.

## 2017-06-03 NOTE — ED Provider Notes (Signed)
[facility] Emergency Department Provider Note  ____________________________________________  Time seen: Approximately 10:07 PM  I have reviewed the triage vital signs and the nursing notes.   HISTORY  Chief Complaint Chest Pain    HPI Pamela Collier is a 77 y.o. female who comes to the ED from urgent care due to chest pain on exertion. She was recently hospitalized for pneumonia, discharged home with home health. Completed her course of Levaquin, but is continued to have generalized weakness. She also notes having chills and sweats at night. She went to urgent care today, they performed a chest x-ray, and told her that her pneumonia wasn't resolved.   Chest pain is intermittent, better with rest. Not positional. Nonradiating. No associated shortness of breath vomiting or diaphoresis.She has had poor appetite for the past several days, not eating regularly or drinking a normal amount of fluids.     Past Medical History:  Diagnosis Date  . Acute respiratory failure ([place]) 11/21/2014  . Anxiety   . Arthritis 08/05/2013  . Atherosclerotic peripheral vascular disease with gangrene [facility]) august 2012  . Cardiomyopathy, ischemic    a. EF 35 to 40% by echo in 2013 b. EF improved to 50-55% by echo in 04/2015; 11/2016 Echo: EF 55-60%, Gr1 DD, mildly dil LA, nl RV fxn.  . Cerebral infarct ([place]) 08/17/2013  . Chronic diastolic CHF (congestive heart failure) (HCC)    a. EF 50-55% by echo in 04/2015; b. 11/2016 Echo: EF 55-60%, Gr1 DD.  [name] COPD (chronic obstructive pulmonary disease) ([place])   . Coronary artery disease, occlusive    a. Previous PCI to the LAD, LCx, and RCA in 2010, 2011, 2013, and 2016. All performed in [place]; b. 11/2016 Lexiscan MV: EF 43%, no ischemia->low risk.  . 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 Diabetic diarrhea ([place]) 10/03/2014  . DM type 2, uncontrolled, with renal complications ([place]) 10/16/3417  . Hepatic steatosis    by CT abd pelvis  . History of  kidney stones    at a younger age  . Hyperlipidemia LDL goal <100 02/23/2014  . Hypertensive heart disease   . Osteoporosis, post-menopausal   . 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 Pleural effusion 10/25/2012   10/2012 CT chest >> small to moderate R lung effusion>> chylothorax, 100% lymphs 10/2013 thoracentesis> cytology negative, WBC 1471, > 90% "small lymphs" 01/2014 CT chest> near complete resolution of pleural effusion, stable lymphadenopathy 08/2014 CT chest [place] ([facility]): small right sided effusion decreased in size, stable mediastinal lymphadenopathy 1.0cm largest, 41m  . 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 Sleep apnea   . Stage III chronic kidney disease [place])      Patient Active Problem List   Diagnosis Date Noted  . Pneumonia 05/26/2017  . Pill esophagitis 05/23/2017  . Abnormal weight gain 12/23/2016  . Chronic kidney disease (CKD), stage I 11/15/2016  . History of fall 11/12/2016  . Osteoarthritis 11/10/2016  . Cervical stenosis of spinal canal 10/10/2016  . Cervical radiculopathy at C7 07/02/2016  . Acute midline thoracic back pain 07/02/2016  . Epigastric hernia 04/01/2016  . Umbilical pain 37/90/2409  .  CAD in native artery   . Type 2 DM with CKD stage 3 and hypertension ([place])   . Chronic renal insufficiency   . Shortness of breath 09/12/2015  . Hypertensive heart disease   . OSA on CPAP 07/07/2015  . Hospital discharge follow-up 07/07/2015  . Chest pain with low risk of acute coronary syndrome 06/29/2015  . CAP (community acquired pneumonia)   . Pleural effusion, right   . Chronic diastolic CHF (congestive heart  failure) ([place]) 04/18/2015  . Diabetic neuropathy ([place]) 02/21/2015  . Benign neoplasm of sigmoid colon   . Low back pain with radiation 04/04/2014  . Essential hypertension 04/04/2014  . Hyperlipidemia 02/23/2014  . Long-term use of high-risk medication 02/23/2014  . Dermatophytic onychia 08/17/2013  . Stable angina ([place]) 08/17/2013  . Cerebral infarct ([place]) 08/17/2013  . Bony exostosis 08/17/2013  . Arthritis 08/05/2013  . History of colonic polyps 02/20/2013  . Sarcoidosis ([place]) 12/07/2012  . Dysphagia 12/07/2012  . Nocturnal cough 10/25/2012  . Pulmonary nodule seen on imaging study 05/20/2012  . 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 Cardiomyopathy, ischemic   . Hepatic steatosis   . Osteoporosis, post-menopausal   . Peripheral vascular disease due to secondary diabetes mellitus ([place])   . DM (diabetes mellitus), type 2, uncontrolled, with renal complications ([place]) 17/49/4496  . Double vessel coronary artery disease 12/14/2011     Past Surgical History:  Procedure Laterality Date  . ABDOMINAL HYSTERECTOMY     at ge 39. secondary to bleeding/partial  . ANTERIOR CERVICAL DECOMP/DISCECTOMY FUSION N/A 10/10/2016   Procedure: Anterior Cervical Discectomy Fusion - Cervical four4- five - Cervical five-Cervical six - Cervical six-Cervical seven;  Surgeon: Earnie Larsson, MD;  Location: [place];  Service: Neurosurgery;  Laterality: N/A;  . BREAST CYST ASPIRATION Right   . CARPAL TUNNEL RELEASE Bilateral   . CHOLECYSTECTOMY     in [place]   . COLONOSCOPY WITH PROPOFOL N/A 01/09/2015   Procedure: COLONOSCOPY WITH PROPOFOL;  Surgeon: Lucilla Lame, MD;  Location: [facility] ENDOSCOPY;  Service: Endoscopy;  Laterality: N/A;  . CORONARY ANGIOPLASTY WITH STENT PLACEMENT     [place]; [facility]  . EYE SURGERY     bil cataracts  . HERNIA REPAIR     umbilical/ Dr Pat Patrick  . PTCA  August 2012   Right Posterior tibial  artery , [place]  . TOE AMPUTATION  Sept 2012   Right 2nd toe, [place]     Prior to Admission medications   Medication Sig Start Date End Date Taking? Authorizing Provider  acetaminophen (TYLENOL) 500 MG tablet Take 1,000 mg by mouth every 6 (six) hours as needed for mild pain or headache.    Yes [provider]  alendronate (FOSAMAX) 70 MG tablet Take 1 tablet (70 mg total) by mouth once a week. Pt takes on Sunday.   Take with a full glass of water on an empty stomach. 07/01/16  Yes [name], MD  ALPRAZolam [name]) 0.25 MG tablet Take 1 tablet (0.25 mg total) by mouth daily. 11/10/16  Yes [name], MD  aspirin EC 81 MG tablet Take 81 mg by mouth daily.   Yes [provider]  B-D UF III MINI PEN NEEDLES 31G X 5 MM MISC USE THREE TIMES A DAY 03/09/17  Yes [name], MD  BRILINTA 90 MG TABS tablet Take 1 tablet (90 mg total) by mouth 2 (two) times daily.  03/05/17  Yes [name], MD  budesonide-formoterol (SYMBICORT) 160-4.5 MCG/ACT inhaler Inhale 2 puffs into the lungs 2 (two) times daily. 02/23/15  Yes Juanito Doom, MD  diclofenac sodium (VOLTAREN) 1 % GEL Apply 2 g topically 4 (four) times daily.   Yes [provider]  docusate sodium (COLACE) 100 MG capsule Take 100 mg by mouth 2 (two) times daily as needed for mild constipation. Reported on 11/09/2015   Yes [provider]  feeding supplement, GLUCERNA SHAKE, (GLUCERNA SHAKE) LIQD Take 237 mLs by mouth 2 (two) times daily between meals. 05/27/17  Yes Gouru, Illene Silver, MD  fluticasone (FLONASE) 50 MCG/ACT nasal spray Place 2 sprays into both nostrils daily as needed for rhinitis.   Yes [provider]  furosemide (LASIX) 20 MG tablet Take 1 tablet (20 mg total) by mouth daily. Patient taking differently: Take 20 mg by mouth every other day.  06/11/16  Yes [name], MD  glucose blood (ACCU-CHEK AVIVA PLUS) test strip USE TO CHECK GLUCOSE  THREE TIMES DAILY 04/29/16  Yes [name], MD  guaiFENesin-dextromethorphan (ROBITUSSIN DM) 100-10 MG/5ML syrup Take 10 mLs by mouth every 6 (six) hours as needed for cough. 05/26/17  Yes Gouru, Illene Silver, MD  HYDROcodone-acetaminophen (NORCO/VICODIN) 5-325 MG tablet Take 1-2 tablets by mouth every 4 (four) hours as needed (breakthrough pain). 10/13/16  Yes Pool, Mallie Mussel, MD  Insulin Lispro Prot & Lispro (HUMALOG MIX 75/25 KWIKPEN) (75-25) 100 UNIT/ML Kwikpen Inject 30 Units into the skin 2 (two) times daily with a meal. 44 units in the morning and 60 units in the evening 05/26/17  Yes Gouru, [place]una, MD  isosorbide mononitrate (IMDUR) 30 MG 24 hr tablet TAKE 1 TABLET DAILY 04/01/17  Yes [name], MD  losartan (COZAAR) 100 MG tablet TAKE 1 TABLET DAILY 12/10/16  Yes [name], MD  LYRICA 100 MG capsule TAKE ONE CAPSULE BY MOUTH THREE TIMES DAILY 11/27/16  Yes [name], MD  metoprolol succinate (TOPROL-XL) 25 MG 24 hr tablet Take 1 tablet (25 mg total) by mouth daily. 06/18/16  Yes [name], MD  nitroGLYCERIN (NITROLINGUAL) 0.4 MG/SPRAY spray Place 1 spray under the tongue every 5 (five) minutes x 3 doses as needed for chest pain. 01/14/17  Yes [name], MD  ondansetron (ZOFRAN) 4 MG tablet Take 1 tablet (4 mg total) by mouth every 8 (eight) hours as needed for nausea or vomiting. 05/18/16  Yes Alfred Levins, [place], MD  PARoxetine (PAXIL) 20 MG tablet TAKE ONE TABLET BY MOUTH ONCE DAILY 06/25/16  Yes [name], MD  polyethylene glycol (MIRALAX / GLYCOLAX) packet Take 17 g by mouth daily as needed for mild constipation. Reported on 10/25/2015   Yes [provider]  rosuvastatin (CRESTOR) 20 MG tablet Take 20 mg by mouth daily.    Yes [provider]  levofloxacin (LEVAQUIN) 750 MG tablet Take 1 tablet (750 mg total) by mouth every other day. Patient not taking: Reported on 06/03/2017 05/27/17   Nicholes Mango, MD  traMADol (ULTRAM) 50 MG tablet Take 1 tablet (50 mg total) by mouth every 6 (six) hours as  needed for moderate pain. 01/23/17   [name], MD     Allergies Cephalexin; Contrast media [iodinated diagnostic agents]; Doxycycline; Gabapentin; and Sulfa antibiotics   Family History  Problem Relation Age of Onset  . Heart disease Father   . Heart attack Father   . Heart disease Sister   . Heart attack  Sister   . Heart disease Brother   . Heart attack Brother   . Asthma Grandchild   . Breast cancer Maternal Aunt     Social History Social History   Tobacco Use  . Smoking status: Never Smoker  . Smokeless tobacco: Never Used  Substance Use Topics  . Alcohol use: No    Alcohol/week: 0.0 oz  . Drug use: No    Review of Systems  Constitutional:   No fever or chills. Positive generalized weakness ENT:   No sore throat. No rhinorrhea. Cardiovascular:   No chest pain or syncope. Respiratory:   No dyspnea or cough. Gastrointestinal:   Negative for abdominal pain, vomiting and diarrhea. Positive for decreased appetite Musculoskeletal:   Negative for focal pain or swelling All other systems reviewed and are negative except as documented above in ROS and HPI.  ____________________________________________   PHYSICAL EXAM:  VITAL SIGNS: ED Triage Vitals  Enc Vitals Group     BP 06/03/17 1614 (!) 163/51     Pulse Rate 06/03/17 1614 63     Resp 06/03/17 1614 16     Temp 06/03/17 1614 98 F (36.7 C)     Temp Source 06/03/17 1614 Oral     SpO2 06/03/17 1614 100 %     Weight 06/03/17 1612 180 lb (81.6 kg)     Height 06/03/17 1612 5\' 7"  (1.702 m)     Head Circumference --      Peak Flow --      Pain Score 06/03/17 1611 9     Pain Loc --      Pain Edu? --      Excl. in [place]? --     Vital signs reviewed, nursing assessments reviewed.   Constitutional:   Alert and oriented. Not in distress. Eyes:   No scleral icterus.  EOMI. No nystagmus. No conjunctival pallor. PERRL. ENT   Head:   Normocephalic and atraumatic.   Nose:   No congestion/rhinnorhea.     Mouth/Throat:   Dry mucous membranes, no pharyngeal erythema. No peritonsillar mass.    Neck:   No meningismus. Full ROM. Hematological/Lymphatic/Immunilogical:   No cervical lymphadenopathy. Cardiovascular:   RRR. Symmetric bilateral radial and DP pulses.  No murmurs.  Respiratory:   Normal respiratory effort without tachypnea/retractions. Breath sounds are clear and equal bilaterally. No wheezes/rales/rhonchi. Gastrointestinal:   Soft with left lower quadrant tenderness. Non distended. There is no CVA tenderness.  No rebound, rigidity, or guarding. Genitourinary:   deferred Musculoskeletal:   Normal range of motion in all extremities. No joint effusions.  No lower extremity tenderness.  No edema. Neurologic:   Normal speech and language.  Motor grossly intact. No acute focal neurologic deficits are appreciated.  Skin:    Skin is warm, dry and intact. No rash noted.  No petechiae, purpura, or bullae.  ____________________________________________    LABS (pertinent positives/negatives) (all labs ordered are listed, but only abnormal results are displayed) Labs Reviewed  BASIC METABOLIC PANEL - Abnormal; Notable for the following components:      Result Value   BUN 30 (*)    Creatinine, Ser 1.54 (*)    GFR calc non Af Amer 31 (*)    GFR calc Af Amer 36 (*)    All other components within normal limits  CBC - Abnormal; Notable for the following components:   RDW 16.1 (*)    All other components within normal limits  GLUCOSE, CAPILLARY - Abnormal; Notable for the following components:  Glucose-Capillary 103 (*)    All other components within normal limits  TROPONIN I  GLUCOSE, CAPILLARY   ____________________________________________   EKG  Interpreted by me Sinus rhythm rate of 64, left axis, first-degree AV block. Right bundle branch block. LVH. No acute ischemic changes.  ____________________________________________    RADIOLOGY  Ct Abdomen Pelvis Wo  Contrast  Result Date: 06/03/2017 CLINICAL DATA:  Weakness and cough EXAM: CT CHEST, ABDOMEN AND PELVIS WITHOUT CONTRAST TECHNIQUE: Multidetector CT imaging of the chest, abdomen and pelvis was performed following the standard protocol without IV contrast. COMPARISON:  Radiograph 06/03/2017, CT chest abdomen pelvis 05/23/2017, CT chest 07/11/2015, 01/11/2015, 01/24/2014 FINDINGS: CT CHEST FINDINGS Cardiovascular: Limited evaluation without intravenous contrast. Moderate aortic atherosclerosis. No aneurysmal dilatation. Coronary vessel calcification. Normal heart size. Trace pericardial effusion Mediastinum/Nodes: Midline trachea. No thyroid mass. Mild air distention of the esophagus. Multiple calcified mediastinal and hilar lymph nodes consistent with prior granulomatous disease. Lungs/Pleura: Small right-sided pleural effusion, decreased compared to prior. Hazy atelectasis or resolving infiltrate at the right base. Bilateral pulmonary nodules again visualized. Stable 11 mm right upper lobe lung nodule since 2015, therefore felt benign. No new infiltrate. Musculoskeletal: Degenerative changes. No acute or suspicious lesion. CT ABDOMEN PELVIS FINDINGS Hepatobiliary: No focal liver abnormality is seen. Status post cholecystectomy. No biliary dilatation. Pancreas: Unremarkable. No pancreatic ductal dilatation or surrounding inflammatory changes. Spleen: Normal in size without focal abnormality. Adrenals/Urinary Tract: Adrenal glands are unremarkable. Kidneys are normal, without renal calculi, focal lesion, or hydronephrosis. Bladder is unremarkable. Stomach/Bowel: Stomach is within normal limits. Appendix not well seen but no right lower quadrant inflammatory process. No evidence of bowel wall thickening, distention, or inflammatory changes. Vascular/Lymphatic: Moderate aortic atherosclerosis. No aneurysmal dilatation. Stable small retroperitoneal lymph nodes. Stable right cardio phrenic lymph nodes. Reproductive:  Status post hysterectomy. No adnexal masses. Other: Negative for free air or free fluid. Small fat in the umbilical region Musculoskeletal: No acute or suspicious lesions. IMPRESSION: 1. Small right pleural effusion, decreased compared to most recent CT. Minimal hazy atelectasis or resolving infiltrate in the right lower lobe. 2. Stable bilateral pulmonary nodules including an 11 mm right upper lobe lung nodule, felt benign 3. No CT evidence for acute intra-abdominal or pelvic abnormality. 4. Mild sigmoid colon diverticular disease without acute inflammation. Electronically Signed   By: Donavan Foil M.D.   On: 06/03/2017 21:46   Dg Chest 2 View  Result Date: 06/03/2017 CLINICAL DATA:  Cough and weakness. Recently hospitalized with pneumonia. EXAM: CHEST  2 VIEW COMPARISON:  Earlier same day; 05/23/2017; chest CT-05/23/2017 FINDINGS: Unchanged cardiac silhouette and mediastinal contours. Slightly reduced lung volumes with associated bibasilar opacities, likely atelectasis. No pleural effusion or pneumothorax. No evidence of edema. There is minimal pleuroparenchymal thickening about the right minor and bilateral major fissures, unchanged. No acute osseus abnormalities. Post lower cervical ACDF. Post cholecystectomy. IMPRESSION: Bibasilar atelectasis without superimposed acute cardiopulmonary disease. Specifically, no discrete focal airspace opacities to suggest pneumonia. Electronically Signed   By: Sandi Mariscal M.D.   On: 06/03/2017 17:11   Dg Chest 2 View  Result Date: 06/03/2017 CLINICAL DATA:  Cough and congestion. Left-sided chest pain patient was recently diagnosed with pneumonia. EXAM: CHEST  2 VIEW COMPARISON:  05/23/2017; 12/24/2016; 05/16/2016; chest CT-05/23/2017 FINDINGS: Grossly unchanged cardiac silhouette and mediastinal contours with partially calcified bilateral hilar and mediastinal lymph nodes. Overall improved aeration of the lungs with persistent bilateral infrahilar opacities favored to  represent atelectasis. Suspected reduction in residual trace right-sided pleural effusion. No definite left-sided  pleural effusion. No pneumothorax. There is a minimal amount of pleuroparenchymal thickening about the right minor fissure, unchanged. No evidence of edema. No acute osseous abnormalities. Post lower cervical ACDF, incompletely evaluated. Old fracture involving the posterolateral aspect the left fifth rib. Mild (approximately 25%) compression deformity involving the superior endplates of the H54 and T12 vertebral bodies, unchanged. IMPRESSION: Overall improved aeration of lungs with suspected trace residual right-sided effusion and associated bibasilar opacities, likely atelectasis. Electronically Signed   By: Sandi Mariscal M.D.   On: 06/03/2017 14:50   Ct Chest Wo Contrast  Result Date: 06/03/2017 CLINICAL DATA:  Weakness and cough EXAM: CT CHEST, ABDOMEN AND PELVIS WITHOUT CONTRAST TECHNIQUE: Multidetector CT imaging of the chest, abdomen and pelvis was performed following the standard protocol without IV contrast. COMPARISON:  Radiograph 06/03/2017, CT chest abdomen pelvis 05/23/2017, CT chest 07/11/2015, 01/11/2015, 01/24/2014 FINDINGS: CT CHEST FINDINGS Cardiovascular: Limited evaluation without intravenous contrast. Moderate aortic atherosclerosis. No aneurysmal dilatation. Coronary vessel calcification. Normal heart size. Trace pericardial effusion Mediastinum/Nodes: Midline trachea. No thyroid mass. Mild air distention of the esophagus. Multiple calcified mediastinal and hilar lymph nodes consistent with prior granulomatous disease. Lungs/Pleura: Small right-sided pleural effusion, decreased compared to prior. Hazy atelectasis or resolving infiltrate at the right base. Bilateral pulmonary nodules again visualized. Stable 11 mm right upper lobe lung nodule since 2015, therefore felt benign. No new infiltrate. Musculoskeletal: Degenerative changes. No acute or suspicious lesion. CT ABDOMEN PELVIS  FINDINGS Hepatobiliary: No focal liver abnormality is seen. Status post cholecystectomy. No biliary dilatation. Pancreas: Unremarkable. No pancreatic ductal dilatation or surrounding inflammatory changes. Spleen: Normal in size without focal abnormality. Adrenals/Urinary Tract: Adrenal glands are unremarkable. Kidneys are normal, without renal calculi, focal lesion, or hydronephrosis. Bladder is unremarkable. Stomach/Bowel: Stomach is within normal limits. Appendix not well seen but no right lower quadrant inflammatory process. No evidence of bowel wall thickening, distention, or inflammatory changes. Vascular/Lymphatic: Moderate aortic atherosclerosis. No aneurysmal dilatation. Stable small retroperitoneal lymph nodes. Stable right cardio phrenic lymph nodes. Reproductive: Status post hysterectomy. No adnexal masses. Other: Negative for free air or free fluid. Small fat in the umbilical region Musculoskeletal: No acute or suspicious lesions. IMPRESSION: 1. Small right pleural effusion, decreased compared to most recent CT. Minimal hazy atelectasis or resolving infiltrate in the right lower lobe. 2. Stable bilateral pulmonary nodules including an 11 mm right upper lobe lung nodule, felt benign 3. No CT evidence for acute intra-abdominal or pelvic abnormality. 4. Mild sigmoid colon diverticular disease without acute inflammation. Electronically Signed   By: Donavan Foil M.D.   On: 06/03/2017 21:46    ____________________________________________   PROCEDURES Procedures  ____________________________________________    CLINICAL IMPRESSION / ASSESSMENT AND PLAN / ED COURSE  Pertinent labs & imaging results that were available during my care of the patient were reviewed by me and considered in my medical decision making (see chart for details).   Patient presents with generalized weakness. Able to ambulate a short distance to the toilet but only with assistance. She also notes new chest pain with  walking and mild exertion that started this afternoon. Resolves with rest. Reproducible here in the treatment room walking to the toilet. Denies ever having anything like that before. We will repeat noncontrast CT chest abdomen pelvis due to her left lower quadrant pain as well as to evaluate interval change of her pneumonia given her persistent constitutional symptoms.  Clinical Course as of Jun 03 2334  Wed Jun 03, 2017  2206 CT chest/abd/pelvis reviewed. No acute  pathology. Shows resolving infiltrate from previously treated pneumonia. Suspect sx are due to medication effect and mild dehydration. Will reassess after ivf   [PS]    Clinical Course User Index [PS] Carrie Mew, MD    ----------------------------------------- 11:35 PM on 06/03/2017 -----------------------------------------  .Feeling better. Persistent recurrent chest pain. Case discussed with hospitalist for further evaluation.   ____________________________________________   FINAL CLINICAL IMPRESSION(S) / ED DIAGNOSES    Final diagnoses:  Chest pain, unspecified type       Portions of this note were generated with dragon dictation software. Dictation errors may occur despite best attempts at proofreading.    Carrie Mew, MD 06/03/17 [phone]

## 2017-06-03 NOTE — ED Notes (Signed)
ED Provider at bedside. 

## 2017-06-04 ENCOUNTER — Encounter: Payer: Self-pay | Admitting: Nurse Practitioner

## 2017-06-04 DIAGNOSIS — R0782 Intercostal pain: Secondary | ICD-10-CM | POA: Diagnosis not present

## 2017-06-04 DIAGNOSIS — I2510 Atherosclerotic heart disease of native coronary artery without angina pectoris: Secondary | ICD-10-CM | POA: Diagnosis not present

## 2017-06-04 DIAGNOSIS — R079 Chest pain, unspecified: Secondary | ICD-10-CM | POA: Diagnosis not present

## 2017-06-04 DIAGNOSIS — E119 Type 2 diabetes mellitus without complications: Secondary | ICD-10-CM | POA: Diagnosis not present

## 2017-06-04 DIAGNOSIS — F419 Anxiety disorder, unspecified: Secondary | ICD-10-CM | POA: Diagnosis not present

## 2017-06-04 DIAGNOSIS — R0789 Other chest pain: Secondary | ICD-10-CM | POA: Diagnosis not present

## 2017-06-04 LAB — CBC
HEMATOCRIT: 35.7 % (ref 35.0–47.0)
Hemoglobin: 11.8 g/dL — ABNORMAL LOW (ref 12.0–16.0)
MCH: 29 pg (ref 26.0–34.0)
MCHC: 33.2 g/dL (ref 32.0–36.0)
MCV: 87.3 fL (ref 80.0–100.0)
PLATELETS: 239 10*3/uL (ref 150–440)
RBC: 4.09 MIL/uL (ref 3.80–5.20)
RDW: 16 % — AB (ref 11.5–14.5)
WBC: 5.9 10*3/uL (ref 3.6–11.0)

## 2017-06-04 LAB — BASIC METABOLIC PANEL
Anion gap: 5 (ref 5–15)
BUN: 26 mg/dL — AB (ref 6–20)
CHLORIDE: 108 mmol/L (ref 101–111)
CO2: 26 mmol/L (ref 22–32)
CREATININE: 1.35 mg/dL — AB (ref 0.44–1.00)
Calcium: 9.1 mg/dL (ref 8.9–10.3)
GFR calc Af Amer: 43 mL/min — ABNORMAL LOW (ref >60)
GFR calc non Af Amer: 37 mL/min — ABNORMAL LOW (ref >60)
Glucose, Bld: 103 mg/dL — ABNORMAL HIGH (ref 65–99)
POTASSIUM: 4.2 mmol/L (ref 3.5–5.1)
Sodium: 139 mmol/L (ref 135–145)

## 2017-06-04 LAB — TROPONIN I
Troponin I: <0.03 ng/mL (ref <0.03)
Troponin I: <0.03 ng/mL (ref <0.03)

## 2017-06-04 LAB — GLUCOSE, CAPILLARY: Glucose-Capillary: 103 mg/dL — ABNORMAL HIGH (ref 65–99)

## 2017-06-04 MED ORDER — ISOSORBIDE MONONITRATE ER 60 MG PO TB24: 60.0000 mg | ORAL_TABLET | Freq: Every day | ORAL | Status: DC

## 2017-06-04 MED ORDER — MOMETASONE FURO-FORMOTEROL FUM 200-5 MCG/ACT IN AERO
2.0000 | INHALATION_SPRAY | Freq: Two times a day (BID) | RESPIRATORY_TRACT | Status: DC
  Administered 2017-06-04: 2 via RESPIRATORY_TRACT
  Filled 2017-06-04: qty 8.8

## 2017-06-04 MED ORDER — ASPIRIN EC 81 MG PO TBEC
81.0000 mg | DELAYED_RELEASE_TABLET | Freq: Every day | ORAL | Status: DC
  Administered 2017-06-04: 81 mg via ORAL
  Filled 2017-06-04: qty 1

## 2017-06-04 MED ORDER — GUAIFENESIN-DM 100-10 MG/5ML PO SYRP
5.0000 mL | ORAL_SOLUTION | ORAL | Status: DC | PRN
  Administered 2017-06-04: 5 mL via ORAL
  Filled 2017-06-04: qty 5

## 2017-06-04 MED ORDER — PREGABALIN 50 MG PO CAPS
100.0000 mg | ORAL_CAPSULE | Freq: Three times a day (TID) | ORAL | Status: DC
  Administered 2017-06-04 (×2): 100 mg via ORAL
  Filled 2017-06-04 (×2): qty 2

## 2017-06-04 MED ORDER — PREGABALIN 50 MG PO CAPS: 100.0000 mg | ORAL_CAPSULE | Freq: Three times a day (TID) | ORAL | Status: DC

## 2017-06-04 MED ORDER — LOSARTAN POTASSIUM 50 MG PO TABS
100.0000 mg | ORAL_TABLET | Freq: Every day | ORAL | Status: DC
  Administered 2017-06-04: 100 mg via ORAL
  Filled 2017-06-04: qty 2

## 2017-06-04 MED ORDER — HEPARIN SODIUM (PORCINE) 5000 UNIT/ML IJ SOLN
5000.0000 [IU] | Freq: Three times a day (TID) | INTRAMUSCULAR | Status: DC
  Administered 2017-06-04: 5000 [IU] via SUBCUTANEOUS
  Filled 2017-06-04: qty 1

## 2017-06-04 MED ORDER — ALPRAZOLAM 0.25 MG PO TABS
0.2500 mg | ORAL_TABLET | Freq: Every day | ORAL | Status: DC
  Administered 2017-06-04: 0.25 mg via ORAL
  Filled 2017-06-04: qty 1

## 2017-06-04 MED ORDER — ACETAMINOPHEN 325 MG PO TABS: 650.0000 mg | ORAL_TABLET | Freq: Four times a day (QID) | ORAL | Status: DC | PRN

## 2017-06-04 MED ORDER — NITROGLYCERIN 0.4 MG/SPRAY TL SOLN: 1.0000 | Status: DC | PRN

## 2017-06-04 MED ORDER — HYDROCODONE-ACETAMINOPHEN 5-325 MG PO TABS
1.0000 | ORAL_TABLET | ORAL | Status: DC | PRN
  Administered 2017-06-04: 1 via ORAL
  Filled 2017-06-04: qty 1

## 2017-06-04 MED ORDER — ACETAMINOPHEN 650 MG RE SUPP: 650.0000 mg | Freq: Four times a day (QID) | RECTAL | Status: DC | PRN

## 2017-06-04 MED ORDER — ONDANSETRON HCL 4 MG/2ML IJ SOLN: 4.0000 mg | Freq: Four times a day (QID) | INTRAMUSCULAR | Status: DC | PRN

## 2017-06-04 MED ORDER — FUROSEMIDE 20 MG PO TABS: 20.0000 mg | ORAL_TABLET | ORAL | 0 refills | Status: DC

## 2017-06-04 MED ORDER — ROSUVASTATIN CALCIUM 10 MG PO TABS: 20.0000 mg | ORAL_TABLET | Freq: Every day | ORAL | Status: DC

## 2017-06-04 MED ORDER — MORPHINE SULFATE (PF) 2 MG/ML IV SOLN
2.0000 mg | Freq: Once | INTRAVENOUS | Status: AC
  Administered 2017-06-04: 2 mg via INTRAVENOUS
  Filled 2017-06-04: qty 1

## 2017-06-04 MED ORDER — ROSUVASTATIN CALCIUM 10 MG PO TABS
20.0000 mg | ORAL_TABLET | Freq: Every day | ORAL | Status: DC
  Administered 2017-06-04: 20 mg via ORAL
  Filled 2017-06-04 (×2): qty 2

## 2017-06-04 MED ORDER — METOPROLOL SUCCINATE ER 25 MG PO TB24
25.0000 mg | ORAL_TABLET | Freq: Every day | ORAL | Status: DC
  Administered 2017-06-04: 25 mg via ORAL
  Filled 2017-06-04: qty 1

## 2017-06-04 MED ORDER — ONDANSETRON HCL 4 MG PO TABS: 4.0000 mg | ORAL_TABLET | Freq: Four times a day (QID) | ORAL | Status: DC | PRN

## 2017-06-04 MED ORDER — TICAGRELOR 90 MG PO TABS
90.0000 mg | ORAL_TABLET | Freq: Two times a day (BID) | ORAL | Status: DC
  Administered 2017-06-04 (×2): 90 mg via ORAL
  Filled 2017-06-04 (×2): qty 1

## 2017-06-04 MED ORDER — PAROXETINE HCL 20 MG PO TABS
20.0000 mg | ORAL_TABLET | Freq: Every day | ORAL | Status: DC
  Administered 2017-06-04: 20 mg via ORAL
  Filled 2017-06-04: qty 1

## 2017-06-04 MED ORDER — ISOSORBIDE MONONITRATE ER 30 MG PO TB24
30.0000 mg | ORAL_TABLET | Freq: Every day | ORAL | Status: DC
  Administered 2017-06-04: 30 mg via ORAL
  Filled 2017-06-04: qty 1

## 2017-06-04 MED ORDER — NITROGLYCERIN 0.4 MG SL SUBL: 0.4000 mg | SUBLINGUAL_TABLET | SUBLINGUAL | Status: DC | PRN

## 2017-06-04 MED ORDER — ALUM & MAG HYDROXIDE-SIMETH 200-200-20 MG/5ML PO SUSP: 15.0000 mL | ORAL | Status: DC | PRN

## 2017-06-04 MED ORDER — LOPERAMIDE HCL 2 MG PO CAPS
4.0000 mg | ORAL_CAPSULE | ORAL | Status: DC | PRN
  Administered 2017-06-04: 4 mg via ORAL
  Filled 2017-06-04: qty 2

## 2017-06-04 NOTE — Progress Notes (Signed)
Discharge instructions explained to pt/ verbalized an understanding/ iv and tele removed/ pt unable to find ride home / taxi voucher provided

## 2017-06-04 NOTE — H&P (Signed)
[facility] [place] [name]    MR#:  [id number]  DATE OF BIRTH:  1940/10/18  DATE OF ADMISSION:  06/03/2017  PRIMARY CARE PHYSICIAN: [name], MD   REQUESTING/REFERRING PHYSICIAN: Joni Fears, MD  CHIEF COMPLAINT:   Chief Complaint  Patient presents with  . Chest Pain    HISTORY OF PRESENT ILLNESS:  Pamela Collier  is a 77 y.o. female who presents with chest pain.  Patient was recently admitted here diagnosed with pneumonia.  She returns today with left-sided chest discomfort, with possible radiation to her neck.  She states she typically has some neck pain due to her prior surgery, but it was somewhat worse today during her episode of chest pain.  Her initial workup here in the ED is largely within normal limits.  Hospitalists were called for admission and further evaluation  PAST MEDICAL HISTORY:   Past Medical History:  Diagnosis Date  . Acute respiratory failure ([place]) 11/21/2014  . Anxiety   . Arthritis 08/05/2013  . Atherosclerotic peripheral vascular disease with gangrene [facility]) august 2012  . Cardiomyopathy, ischemic    a. EF 35 to 40% by echo in 2013 b. EF improved to 50-55% by echo in 04/2015; 11/2016 Echo: EF 55-60%, Gr1 DD, mildly dil LA, nl RV fxn.  . Cerebral infarct ([place]) 08/17/2013  . Chronic diastolic CHF (congestive heart failure) (HCC)    a. EF 50-55% by echo in 04/2015; b. 11/2016 Echo: EF 55-60%, Gr1 DD.  [name] COPD (chronic obstructive pulmonary disease) ([place])   . Coronary artery disease, occlusive    a. Previous PCI to the LAD, LCx, and RCA in 2010, 2011, 2013, and 2016. All performed in [place]; b. 11/2016 Lexiscan MV: EF 43%, no ischemia->low risk.  . 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 Diabetic diarrhea ([place]) 10/03/2014  . DM type 2, uncontrolled, with renal complications ([place]) 0/07/5463  . Hepatic steatosis    by CT abd pelvis  . History of kidney stones    at a younger age  . Hyperlipidemia LDL  goal <100 02/23/2014  . Hypertensive heart disease   . Osteoporosis, post-menopausal   . Peripheral vascular disease due to secondary diabetes mellitus 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 Pleural effusion 10/25/2012   10/2012 CT chest >> small to moderate R lung effusion>> chylothorax, 100% lymphs 10/2013 thoracentesis> cytology negative, WBC 1471, > 90% "small lymphs" 01/2014 CT chest> near complete resolution of pleural effusion, stable lymphadenopathy 08/2014 CT chest [place] ([facility]): small right sided effusion decreased in size, stable mediastinal lymphadenopathy 1.0cm largest, 49m  . 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 Sleep apnea   . Stage III chronic kidney disease ([place])     PAST SURGICAL HISTORY:   Past Surgical History:  Procedure Laterality Date  . ABDOMINAL HYSTERECTOMY     at ge 39. secondary to bleeding/partial  . ANTERIOR CERVICAL DECOMP/DISCECTOMY FUSION N/A 10/10/2016   Procedure: Anterior Cervical Discectomy Fusion - Cervical four4- five - Cervical five-Cervical six - Cervical six-Cervical seven;  Surgeon: Earnie Larsson, MD;  Location: [facility];  Service: Neurosurgery;  Laterality: N/A;  . BREAST CYST ASPIRATION  Right   . CARPAL TUNNEL RELEASE Bilateral   . CHOLECYSTECTOMY     in [place]   . COLONOSCOPY WITH PROPOFOL N/A 01/09/2015   Procedure: COLONOSCOPY WITH PROPOFOL;  Surgeon: Lucilla Lame, MD;  Location: [facility] ENDOSCOPY;  Service: Endoscopy;  Laterality: N/A;  . CORONARY ANGIOPLASTY WITH STENT PLACEMENT     [place]; [facility]  . EYE SURGERY     bil cataracts  . HERNIA REPAIR     umbilical/ Dr Pat Patrick  . PTCA  August 2012   Right Posterior tibial artery , [place]  . TOE  AMPUTATION  Sept 2012   Right 2nd toe, [place]    SOCIAL HISTORY:   Social History   Tobacco Use  . Smoking status: Never Smoker  . Smokeless tobacco: Never Used  Substance Use Topics  . Alcohol use: No    Alcohol/week: 0.0 oz    FAMILY HISTORY:   Family History  Problem Relation Age of Onset  . Heart disease Father   . Heart attack Father   . Heart disease Sister   . Heart attack Sister   . Heart disease Brother   . Heart attack Brother   . Asthma Grandchild   . Breast cancer Maternal Aunt     DRUG ALLERGIES:   Allergies  Allergen Reactions  . Cephalexin Itching  . Contrast Media [Iodinated Diagnostic Agents] Itching  . Doxycycline Itching  . Gabapentin Itching  . Sulfa Antibiotics Itching    MEDICATIONS AT HOME:   Prior to Admission medications   Medication Sig Start Date End Date Taking? Authorizing Provider  acetaminophen (TYLENOL) 500 MG tablet Take 1,000 mg by mouth every 6 (six) hours as needed for mild pain or headache.    Yes [provider]  alendronate (FOSAMAX) 70 MG tablet Take 1 tablet (70 mg total) by mouth once a week. Pt takes on Sunday.   Take with a full glass of water on an empty stomach. 07/01/16  Yes [name], MD  ALPRAZolam [name]) 0.25 MG tablet Take 1 tablet (0.25 mg total) by mouth daily. 11/10/16  Yes [name], MD  aspirin EC 81 MG tablet Take 81 mg by mouth daily.   Yes [provider]  B-D UF III MINI PEN NEEDLES 31G X 5 MM MISC USE THREE TIMES A DAY 03/09/17  Yes [name], MD  BRILINTA 90 MG TABS tablet Take 1 tablet (90 mg total) by mouth 2 (two) times daily. 03/05/17  Yes [name], MD  budesonide-formoterol (SYMBICORT) 160-4.5 MCG/ACT inhaler Inhale 2 puffs into the lungs 2 (two) times daily. 02/23/15  Yes Juanito Doom, MD  diclofenac sodium (VOLTAREN) 1 % GEL Apply 2 g topically 4 (four) times daily.   Yes [provider]  docusate sodium (COLACE) 100 MG capsule Take 100 mg  by mouth 2 (two) times daily as needed for mild constipation. Reported on 11/09/2015   Yes [provider]  feeding supplement, GLUCERNA SHAKE, (GLUCERNA SHAKE) LIQD Take 237 mLs by mouth 2 (two) times daily between meals. 05/27/17  Yes Gouru, Illene Silver, MD  fluticasone (FLONASE) 50 MCG/ACT nasal spray Place 2 sprays into both nostrils daily as needed for rhinitis.   Yes [provider]  furosemide (LASIX) 20 MG tablet Take 1 tablet (20 mg total) by mouth daily. Patient taking differently: Take 20 mg by mouth every other day.  06/11/16  Yes [name], MD  glucose blood (ACCU-CHEK AVIVA PLUS)  test strip USE TO CHECK GLUCOSE  THREE TIMES DAILY 04/29/16  Yes [name], MD  guaiFENesin-dextromethorphan (ROBITUSSIN DM) 100-10 MG/5ML syrup Take 10 mLs by mouth every 6 (six) hours as needed for cough. 05/26/17  Yes Gouru, Illene Silver, MD  HYDROcodone-acetaminophen (NORCO/VICODIN) 5-325 MG tablet Take 1-2 tablets by mouth every 4 (four) hours as needed (breakthrough pain). 10/13/16  Yes Pool, Mallie Mussel, MD  Insulin Lispro Prot & Lispro (HUMALOG MIX 75/25 KWIKPEN) (75-25) 100 UNIT/ML Kwikpen Inject 30 Units into the skin 2 (two) times daily with a meal. 44 units in the morning and 60 units in the evening 05/26/17  Yes Gouru, Aruna, MD  isosorbide mononitrate (IMDUR) 30 MG 24 hr tablet TAKE 1 TABLET DAILY 04/01/17  Yes [name], MD  losartan (COZAAR) 100 MG tablet TAKE 1 TABLET DAILY 12/10/16  Yes [name], MD  LYRICA 100 MG capsule TAKE ONE CAPSULE BY MOUTH THREE TIMES DAILY 11/27/16  Yes [name], MD  metoprolol succinate (TOPROL-XL) 25 MG 24 hr tablet Take 1 tablet (25 mg total) by mouth daily. 06/18/16  Yes [name], MD  nitroGLYCERIN (NITROLINGUAL) 0.4 MG/SPRAY spray Place 1 spray under the tongue every 5 (five) minutes x 3 doses as needed for chest pain. 01/14/17  Yes [name], MD  ondansetron (ZOFRAN) 4 MG tablet Take 1 tablet (4 mg total) by mouth every 8 (eight)  hours as needed for nausea or vomiting. 05/18/16  Yes Alfred Levins, [place], MD  PARoxetine (PAXIL) 20 MG tablet TAKE ONE TABLET BY MOUTH ONCE DAILY 06/25/16  Yes [name], MD  polyethylene glycol (MIRALAX / GLYCOLAX) packet Take 17 g by mouth daily as needed for mild constipation. Reported on 10/25/2015   Yes [provider]  rosuvastatin (CRESTOR) 20 MG tablet Take 20 mg by mouth daily.    Yes [provider]  levofloxacin (LEVAQUIN) 750 MG tablet Take 1 tablet (750 mg total) by mouth every other day. Patient not taking: Reported on 06/03/2017 05/27/17   Nicholes Mango, MD  traMADol (ULTRAM) 50 MG tablet Take 1 tablet (50 mg total) by mouth every 6 (six) hours as needed for moderate pain. 01/23/17   [name], MD    REVIEW OF SYSTEMS:  Review of Systems  Constitutional: Negative for chills, fever, malaise/fatigue and weight loss.  HENT: Negative for ear pain, hearing loss and tinnitus.   Eyes: Negative for blurred vision, double vision, pain and redness.  Respiratory: Positive for shortness of breath. Negative for cough and hemoptysis.   Cardiovascular: Positive for chest pain. Negative for palpitations, orthopnea and leg swelling.  Gastrointestinal: Negative for abdominal pain, constipation, diarrhea, nausea and vomiting.  Genitourinary: Negative for dysuria, frequency and hematuria.  Musculoskeletal: Negative for back pain, joint pain and neck pain.  Skin:       No acne, rash, or lesions  Neurological: Negative for dizziness, tremors, focal weakness and weakness.  Endo/Heme/Allergies: Negative for polydipsia. Does not bruise/bleed easily.  Psychiatric/Behavioral: Negative for depression. The patient is not nervous/anxious and does not have insomnia.      VITAL SIGNS:   Vitals:   06/03/17 1930 06/03/17 2000 06/03/17 2030 06/03/17 2314  BP: (!) 132/57 (!) 165/62 127/71 125/70  Pulse: 60 61 (!) 56 63  Resp: 14 16 20 17   Temp:      TempSrc:      SpO2: 97% 98% 99%  98%  Weight:      Height:  Wt Readings from Last 3 Encounters:  06/03/17 81.6 kg (180 lb)  06/03/17 81.6 kg (180 lb)  05/26/17 85 kg (187 lb 4.8 oz)    PHYSICAL EXAMINATION:  Physical Exam  Vitals reviewed. Constitutional: She is oriented to person, place, and time. She appears well-developed and well-nourished. No distress.  HENT:  Head: Normocephalic and atraumatic.  Mouth/Throat: Oropharynx is clear and moist.  Eyes: Conjunctivae and EOM are normal. Pupils are equal, round, and reactive to light. No scleral icterus.  Neck: Normal range of motion. Neck supple. No JVD present. No thyromegaly present.  Cardiovascular: Normal rate, regular rhythm and intact distal pulses. Exam reveals no gallop and no friction rub.  No murmur heard. Respiratory: Effort normal and breath sounds normal. No respiratory distress. She has no wheezes. She has no rales.  GI: Soft. Bowel sounds are normal. She exhibits no distension. There is no tenderness.  Musculoskeletal: Normal range of motion. She exhibits no edema.  No arthritis, no gout  Lymphadenopathy:    She has no cervical adenopathy.  Neurological: She is alert and oriented to person, place, and time. No cranial nerve deficit.  No dysarthria, no aphasia  Skin: Skin is warm and dry. No rash noted. No erythema.  Psychiatric: She has a normal mood and affect. Her behavior is normal. Judgment and thought content normal.    LABORATORY PANEL:   CBC Recent Labs  Lab 06/03/17 1609  WBC 6.9  HGB 12.0  HCT 36.3  PLT 287   ------------------------------------------------------------------------------------------------------------------  Chemistries  Recent Labs  Lab 06/03/17 1421 06/03/17 1609  NA 137 138  K 4.2 4.0  CL 106 106  CO2 25 25  GLUCOSE 109* 75  BUN 32* 30*  CREATININE 1.55* 1.54*  CALCIUM 9.0 9.2  AST 17  --   ALT 11*  --   ALKPHOS 58  --   BILITOT 0.6  --     ------------------------------------------------------------------------------------------------------------------  Cardiac Enzymes Recent Labs  Lab 06/03/17 1609  TROPONINI <0.03   ------------------------------------------------------------------------------------------------------------------  RADIOLOGY:  Ct Abdomen Pelvis Wo Contrast  Result Date: 06/03/2017 CLINICAL DATA:  Weakness and cough EXAM: CT CHEST, ABDOMEN AND PELVIS WITHOUT CONTRAST TECHNIQUE: Multidetector CT imaging of the chest, abdomen and pelvis was performed following the standard protocol without IV contrast. COMPARISON:  Radiograph 06/03/2017, CT chest abdomen pelvis 05/23/2017, CT chest 07/11/2015, 01/11/2015, 01/24/2014 FINDINGS: CT CHEST FINDINGS Cardiovascular: Limited evaluation without intravenous contrast. Moderate aortic atherosclerosis. No aneurysmal dilatation. Coronary vessel calcification. Normal heart size. Trace pericardial effusion Mediastinum/Nodes: Midline trachea. No thyroid mass. Mild air distention of the esophagus. Multiple calcified mediastinal and hilar lymph nodes consistent with prior granulomatous disease. Lungs/Pleura: Small right-sided pleural effusion, decreased compared to prior. Hazy atelectasis or resolving infiltrate at the right base. Bilateral pulmonary nodules again visualized. Stable 11 mm right upper lobe lung nodule since 2015, therefore felt benign. No new infiltrate. Musculoskeletal: Degenerative changes. No acute or suspicious lesion. CT ABDOMEN PELVIS FINDINGS Hepatobiliary: No focal liver abnormality is seen. Status post cholecystectomy. No biliary dilatation. Pancreas: Unremarkable. No pancreatic ductal dilatation or surrounding inflammatory changes. Spleen: Normal in size without focal abnormality. Adrenals/Urinary Tract: Adrenal glands are unremarkable. Kidneys are normal, without renal calculi, focal lesion, or hydronephrosis. Bladder is unremarkable. Stomach/Bowel: Stomach is  within normal limits. Appendix not well seen but no right lower quadrant inflammatory process. No evidence of bowel wall thickening, distention, or inflammatory changes. Vascular/Lymphatic: Moderate aortic atherosclerosis. No aneurysmal dilatation. Stable small retroperitoneal lymph nodes. Stable right cardio phrenic lymph nodes. Reproductive:  Status post hysterectomy. No adnexal masses. Other: Negative for free air or free fluid. Small fat in the umbilical region Musculoskeletal: No acute or suspicious lesions. IMPRESSION: 1. Small right pleural effusion, decreased compared to most recent CT. Minimal hazy atelectasis or resolving infiltrate in the right lower lobe. 2. Stable bilateral pulmonary nodules including an 11 mm right upper lobe lung nodule, felt benign 3. No CT evidence for acute intra-abdominal or pelvic abnormality. 4. Mild sigmoid colon diverticular disease without acute inflammation. Electronically Signed   By: Donavan Foil M.D.   On: 06/03/2017 21:46   Dg Chest 2 View  Result Date: 06/03/2017 CLINICAL DATA:  Cough and weakness. Recently hospitalized with pneumonia. EXAM: CHEST  2 VIEW COMPARISON:  Earlier same day; 05/23/2017; chest CT-05/23/2017 FINDINGS: Unchanged cardiac silhouette and mediastinal contours. Slightly reduced lung volumes with associated bibasilar opacities, likely atelectasis. No pleural effusion or pneumothorax. No evidence of edema. There is minimal pleuroparenchymal thickening about the right minor and bilateral major fissures, unchanged. No acute osseus abnormalities. Post lower cervical ACDF. Post cholecystectomy. IMPRESSION: Bibasilar atelectasis without superimposed acute cardiopulmonary disease. Specifically, no discrete focal airspace opacities to suggest pneumonia. Electronically Signed   By: Sandi Mariscal M.D.   On: 06/03/2017 17:11   Dg Chest 2 View  Result Date: 06/03/2017 CLINICAL DATA:  Cough and congestion. Left-sided chest pain patient was recently diagnosed  with pneumonia. EXAM: CHEST  2 VIEW COMPARISON:  05/23/2017; 12/24/2016; 05/16/2016; chest CT-05/23/2017 FINDINGS: Grossly unchanged cardiac silhouette and mediastinal contours with partially calcified bilateral hilar and mediastinal lymph nodes. Overall improved aeration of the lungs with persistent bilateral infrahilar opacities favored to represent atelectasis. Suspected reduction in residual trace right-sided pleural effusion. No definite left-sided pleural effusion. No pneumothorax. There is a minimal amount of pleuroparenchymal thickening about the right minor fissure, unchanged. No evidence of edema. No acute osseous abnormalities. Post lower cervical ACDF, incompletely evaluated. Old fracture involving the posterolateral aspect the left fifth rib. Mild (approximately 25%) compression deformity involving the superior endplates of the S93 and T12 vertebral bodies, unchanged. IMPRESSION: Overall improved aeration of lungs with suspected trace residual right-sided effusion and associated bibasilar opacities, likely atelectasis. Electronically Signed   By: Sandi Mariscal M.D.   On: 06/03/2017 14:50   Ct Chest Wo Contrast  Result Date: 06/03/2017 CLINICAL DATA:  Weakness and cough EXAM: CT CHEST, ABDOMEN AND PELVIS WITHOUT CONTRAST TECHNIQUE: Multidetector CT imaging of the chest, abdomen and pelvis was performed following the standard protocol without IV contrast. COMPARISON:  Radiograph 06/03/2017, CT chest abdomen pelvis 05/23/2017, CT chest 07/11/2015, 01/11/2015, 01/24/2014 FINDINGS: CT CHEST FINDINGS Cardiovascular: Limited evaluation without intravenous contrast. Moderate aortic atherosclerosis. No aneurysmal dilatation. Coronary vessel calcification. Normal heart size. Trace pericardial effusion Mediastinum/Nodes: Midline trachea. No thyroid mass. Mild air distention of the esophagus. Multiple calcified mediastinal and hilar lymph nodes consistent with prior granulomatous disease. Lungs/Pleura: Small  right-sided pleural effusion, decreased compared to prior. Hazy atelectasis or resolving infiltrate at the right base. Bilateral pulmonary nodules again visualized. Stable 11 mm right upper lobe lung nodule since 2015, therefore felt benign. No new infiltrate. Musculoskeletal: Degenerative changes. No acute or suspicious lesion. CT ABDOMEN PELVIS FINDINGS Hepatobiliary: No focal liver abnormality is seen. Status post cholecystectomy. No biliary dilatation. Pancreas: Unremarkable. No pancreatic ductal dilatation or surrounding inflammatory changes. Spleen: Normal in size without focal abnormality. Adrenals/Urinary Tract: Adrenal glands are unremarkable. Kidneys are normal, without renal calculi, focal lesion, or hydronephrosis. Bladder is unremarkable. Stomach/Bowel: Stomach is within normal limits. Appendix not well  seen but no right lower quadrant inflammatory process. No evidence of bowel wall thickening, distention, or inflammatory changes. Vascular/Lymphatic: Moderate aortic atherosclerosis. No aneurysmal dilatation. Stable small retroperitoneal lymph nodes. Stable right cardio phrenic lymph nodes. Reproductive: Status post hysterectomy. No adnexal masses. Other: Negative for free air or free fluid. Small fat in the umbilical region Musculoskeletal: No acute or suspicious lesions. IMPRESSION: 1. Small right pleural effusion, decreased compared to most recent CT. Minimal hazy atelectasis or resolving infiltrate in the right lower lobe. 2. Stable bilateral pulmonary nodules including an 11 mm right upper lobe lung nodule, felt benign 3. No CT evidence for acute intra-abdominal or pelvic abnormality. 4. Mild sigmoid colon diverticular disease without acute inflammation. Electronically Signed   By: Donavan Foil M.D.   On: 06/03/2017 21:46    EKG:   Orders placed or performed during the hospital encounter of 06/03/17  . EKG 12-Lead  . EKG 12-Lead  . ED EKG within 10 minutes  . ED EKG within 10 minutes     IMPRESSION AND PLAN:  Principal Problem:   Chest pain -initial cardiac enzyme within normal limits.  We will trend her enzymes tonight, get a cardiology consult.  Patient just had echocardiogram done during her last hospital stay this month. Active Problems:   CAD in native artery -continue home meds, other workup as above   Type 2 DM with CKD stage 3 and hypertension (HCC) -sliding scale insulin with corresponding glucose checks   Depression with anxiety -continue home meds   OSA on CPAP -CPAP nightly  All the records are reviewed and case discussed with ED provider. Management plans discussed with the patient and/or family.  DVT PROPHYLAXIS: SubQ heparin  GI PROPHYLAXIS: None  ADMISSION STATUS: Observation  CODE STATUS: Full Code Status History    Date Active Date Inactive Code Status Order ID Comments User Context   05/24/2017 01:29 05/26/2017 19:26 Full Code [id number]  Lance Coon, MD Inpatient   11/14/2016 22:49 11/15/2016 18:29 Full Code [id number]  Harvie Bridge, DO ED   10/10/2016 13:25 10/13/2016 20:20 Full Code [id number]  Earnie Larsson, MD Inpatient   06/11/2016 21:42 06/12/2016 19:23 Full Code [id number]  Harvie Bridge, DO ED   01/02/2016 05:24 01/02/2016 09:14 Full Code [id number]  Saundra Shelling, MD Inpatient   06/26/2015 07:36 06/29/2015 19:54 Full Code [id number]  Demetrios Loll, MD ED   04/20/2015 01:18 04/22/2015 19:18 Full Code [id number]  Lance Coon, MD Inpatient      TOTAL TIME TAKING CARE OF THIS PATIENT: 40 minutes.   Raja Caputi [place] 06/04/2017, 12:05 AM  [facility]  Office  [phone]  CC: Primary care physician; [name], MD  Note:  This document was prepared using Dragon voice recognition software and may include unintentional dictation errors.

## 2017-06-04 NOTE — Plan of Care (Signed)
Pain free on arrival to unit.

## 2017-06-04 NOTE — Consult Note (Signed)
Cardiology Consult    Patient ID: Pamela Collier MRN: [id number], DOB/AGE: [birth year]   Admit date: 06/03/2017 Date of Consult: 06/04/2017  Primary Physician: [name], MD Primary Cardiologist: Ida Rogue, MD Requesting Provider: Chauncey Cruel. Posey Pronto, MD  Patient Profile    Pamela Collier is a 77 y.o. female with a history of CAD, HTN, HL, DM, CKD III, pulm sarcoidosis, PVD, depression, OSA, and prior CVA, who is being seen today for the evaluation of chest pain at the request of Dr. Posey Pronto.  Past Medical History   Past Medical History:  Diagnosis Date  . Acute respiratory failure ([place]) 11/21/2014  . Anxiety   . Arthritis 08/05/2013  . Atherosclerotic peripheral vascular disease with gangrene [facility]) august 2012  . Cardiomyopathy, ischemic    a. EF 35 to 40% by echo in 2013 b. EF improved to 50-55% by echo in 04/2015; 11/2016 Echo: EF 55-60%, Gr1 DD, mildly dil LA, nl RV fxn.  . Cerebral infarct ([place]) 08/17/2013  . Chronic diastolic CHF (congestive heart failure) (HCC)    a. EF 50-55% by echo in 04/2015; b. 11/2016 Echo: EF 55-60%, Gr1 DD.  [name] COPD (chronic obstructive pulmonary disease) ([place])   . Coronary artery disease, occlusive    a. Previous PCI to the LAD, LCx, and RCA in 2010, 2011, 2013, and 2016. All performed in [place]; b. 11/2016 Lexiscan MV: EF 43%, no ischemia->low risk.  . 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 Diabetic diarrhea ([place]) 10/03/2014  . DM type 2, uncontrolled, with renal complications ([place]) 12/12/9935  . Hepatic steatosis    by CT abd pelvis  . History of kidney stones    at a younger age  . Hyperlipidemia LDL goal <100 02/23/2014  . Hypertensive heart disease   . Osteoporosis, post-menopausal   . 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 Pleural effusion 10/25/2012   10/2012 CT chest >> small to moderate R lung effusion>> chylothorax, 100% lymphs 10/2013 thoracentesis> cytology  negative, WBC 1471, > 90% "small lymphs" 01/2014 CT chest> near complete resolution of pleural effusion, stable lymphadenopathy 08/2014 CT chest [place] ([facility]): small right sided effusion decreased in size, stable mediastinal lymphadenopathy 1.0cm largest, 73m  . 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 Sleep apnea   . Stage III chronic kidney disease [facility])     Past Surgical History:  Procedure Laterality Date  . ABDOMINAL HYSTERECTOMY     at ge 39. secondary to bleeding/partial  . ANTERIOR CERVICAL DECOMP/DISCECTOMY FUSION N/A 10/10/2016   Procedure: Anterior Cervical Discectomy Fusion - Cervical four4- five - Cervical five-Cervical six - Cervical six-Cervical seven;  Surgeon: Earnie Larsson, MD;  Location: [place];  Service: Neurosurgery;  Laterality: N/A;  . BREAST CYST ASPIRATION Right   . CARPAL TUNNEL RELEASE Bilateral   . CHOLECYSTECTOMY     in [place]   . COLONOSCOPY WITH PROPOFOL N/A 01/09/2015   Procedure: COLONOSCOPY WITH PROPOFOL;  Surgeon: Lucilla Lame, MD;  Location: [facility] ENDOSCOPY;  Service: Endoscopy;  Laterality: N/A;  . CORONARY ANGIOPLASTY WITH STENT PLACEMENT     [place]; [facility]  . EYE  SURGERY     bil cataracts  . HERNIA REPAIR     umbilical/ Dr Pat Patrick  . PTCA  August 2012   Right Posterior tibial artery , [place]  . TOE AMPUTATION  Sept 2012   Right 2nd toe, [place]     Allergies  Allergies  Allergen Reactions  . Cephalexin Itching  . Contrast Media [Iodinated Diagnostic Agents] Itching  . Doxycycline Itching  . Gabapentin Itching  . Sulfa Antibiotics Itching    History of Present Illness    77 y/o ? with the above complex PMH including CAD, HTN, HL, DM, CKD III, pulmonary sarcoidosis,  PVD, depression, OSA, and CVA.  She is s/p multiple PCIs to the LAD, LCX, and RCA, all being performed in [place] between 2010 and 2016.  In July of 2018, she was admitted with chest tightness in the setting of participating in physical therapy (s/p cervical spine surgery).  She was noted to have mild trop elev to 0.03.  Echo showed nl EF.  Stress testing was low risk.  She was subsequently d/c'd and @ f/u in both July and October, she was doing well w/o recurrent symptoms.  On 1/12, she was admitted with central chest discomfort, weakness, and cough.  Trops were negative and there was strong suspicion for pill esophagitis, though she was also found to have right basilar pna and was treated with abx.  She was d/c'd 1/15.  Following discharge, she continued to experience weakness and intermittent left-sided chest discomfort.  She describes discomfort as a grabbing and sharp sensation coming on without provocation, lasting a few seconds, and resolving spontaneously.  On Sunday, January 20, she developed nausea and vomiting.  This persisted for about 2 hours and then resolved spontaneously.  She continued to feel weak and washed out and presented to an urgent care on January 23.  There, she reported symptoms and was being walked when she had sudden onset of more severe sharp and grabbing-like left-sided chest pain.  In that setting, decision was made to have her transported to the [facility] emergency department.  Here, ECG was nonacute and troponin was normal.  Since admission, she has had a few episodes of more severe grabbing-like left-sided chest pain.  Pain is also worsened when she rolls towards her left side or with any degree of palpation over her left chest.  Inpatient Medications    . ALPRAZolam  0.25 mg Oral Daily  . aspirin EC  81 mg Oral Daily  . heparin  5,000 Units Subcutaneous Q8H  . isosorbide mononitrate  30 mg Oral Daily  . losartan  100 mg Oral Daily  . metoprolol succinate  25 mg Oral Daily  .  mometasone-formoterol  2 puff Inhalation BID  . PARoxetine  20 mg Oral Daily  . pregabalin  100 mg Oral TID  . rosuvastatin  20 mg Oral Daily  . ticagrelor  90 mg Oral BID    Family History    Family History  Problem Relation Age of Onset  . Heart disease Father   . Heart attack Father   . Heart disease Sister   . Heart attack Sister   . Heart disease Brother   . Heart attack Brother   . Asthma Grandchild   . Breast cancer Maternal Aunt    indicated that her mother is alive. She indicated that her father is deceased. She indicated that her sister is deceased. She indicated that her brother is deceased. She indicated that the status of her  maternal aunt is unknown. She indicated that the status of her grandchild is unknown.   Social History    Social History   Socioeconomic History  . Marital status: Married    Spouse name: Not on file  . Number of children: Not on file  . Years of education: Not on file  . Highest education level: Not on file  Social Needs  . Financial resource strain: Not on file  . Food insecurity - worry: Not on file  . Food insecurity - inability: Not on file  . Transportation needs - medical: Not on file  . Transportation needs - non-medical: Not on file  Occupational History  . Occupation: retired  Tobacco Use  . Smoking status: Never Smoker  . Smokeless tobacco: Never Used  Substance and Sexual Activity  . Alcohol use: No    Alcohol/week: 0.0 oz  . Drug use: No  . Sexual activity: No  Other Topics Concern  . Not on file  Social History Narrative  . Not on file     Review of Systems    General:  No chills, fever, night sweats or weight changes.  Cardiovascular:  +++ chest pain, +++ dyspnea on exertion, no edema, orthopnea, palpitations, paroxysmal nocturnal dyspnea. Dermatological: No rash, lesions/masses Respiratory: +++ coug-though overall improving h, +++ dyspnea Urologic: No hematuria, dysuria Abdominal:   +++ nausea, vomiting  on January 20-no recurrence, no diarrhea, bright red blood per rectum, melena, or hematemesis Neurologic:  No visual changes, wkns, changes in mental status. All other systems reviewed and are otherwise negative except as noted above.  Physical Exam    Blood pressure (!) 156/56, pulse 65, temperature 98.5 F (36.9 C), temperature source Oral, resp. rate 18, height 5\' 7"  (1.702 m), weight 186 lb 14.4 oz (84.8 kg), SpO2 100 %.  General: Pleasant, NAD Psych: Normal affect. Neuro: Alert and oriented X 3. Moves all extremities spontaneously. HEENT: Normal  Neck: Supple without bruits or JVD. Lungs:  Resp regular and unlabored, diminished breath sounds halfway up on the right, clear on the left. Heart: RRR no s3, s4, or murmurs.  Her left chest wall is very tender to minimal palpation Abdomen: Soft, non-tender, non-distended, BS + x 4.  Extremities: No clubbing, cyanosis or edema. DP/PT/Radials 2+ and equal bilaterally.  Labs     Recent Labs    06/03/17 1609 06/04/17 0309  TROPONINI <0.03 <0.03   Lab Results  Component Value Date   WBC 6.9 06/03/2017   HGB 12.0 06/03/2017   HCT 36.3 06/03/2017   MCV 87.7 06/03/2017   PLT 287 06/03/2017    Recent Labs  Lab 06/03/17 1421 06/03/17 1609  NA 137 138  K 4.2 4.0  CL 106 106  CO2 25 25  BUN 32* 30*  CREATININE 1.55* 1.54*  CALCIUM 9.0 9.2  PROT 7.4  --   BILITOT 0.6  --   ALKPHOS 58  --   ALT 11*  --   AST 17  --   GLUCOSE 109* 75   Lab Results  Component Value Date   CHOL 124 11/15/2016   HDL 29 (L) 11/15/2016   LDLCALC 60 11/15/2016   TRIG 175 (H) 11/15/2016     Radiology Studies    Ct Abdomen Pelvis Wo Contrast  Result Date: 06/03/2017 CLINICAL DATA:  Weakness and cough EXAM: CT CHEST, ABDOMEN AND PELVIS WITHOUT CONTRAST TECHNIQUE: Multidetector CT imaging of the chest, abdomen and pelvis was performed following the standard protocol without IV contrast. COMPARISON:  Radiograph 06/03/2017, CT chest abdomen pelvis  05/23/2017, CT chest 07/11/2015, 01/11/2015, 01/24/2014 FINDINGS: CT CHEST FINDINGS Cardiovascular: Limited evaluation without intravenous contrast. Moderate aortic atherosclerosis. No aneurysmal dilatation. Coronary vessel calcification. Normal heart size. Trace pericardial effusion Mediastinum/Nodes: Midline trachea. No thyroid mass. Mild air distention of the esophagus. Multiple calcified mediastinal and hilar lymph nodes consistent with prior granulomatous disease. Lungs/Pleura: Small right-sided pleural effusion, decreased compared to prior. Hazy atelectasis or resolving infiltrate at the right base. Bilateral pulmonary nodules again visualized. Stable 11 mm right upper lobe lung nodule since 2015, therefore felt benign. No new infiltrate. Musculoskeletal: Degenerative changes. No acute or suspicious lesion. CT ABDOMEN PELVIS FINDINGS Hepatobiliary: No focal liver abnormality is seen. Status post cholecystectomy. No biliary dilatation. Pancreas: Unremarkable. No pancreatic ductal dilatation or surrounding inflammatory changes. Spleen: Normal in size without focal abnormality. Adrenals/Urinary Tract: Adrenal glands are unremarkable. Kidneys are normal, without renal calculi, focal lesion, or hydronephrosis. Bladder is unremarkable. Stomach/Bowel: Stomach is within normal limits. Appendix not well seen but no right lower quadrant inflammatory process. No evidence of bowel wall thickening, distention, or inflammatory changes. Vascular/Lymphatic: Moderate aortic atherosclerosis. No aneurysmal dilatation. Stable small retroperitoneal lymph nodes. Stable right cardio phrenic lymph nodes. Reproductive: Status post hysterectomy. No adnexal masses. Other: Negative for free air or free fluid. Small fat in the umbilical region Musculoskeletal: No acute or suspicious lesions. IMPRESSION: 1. Small right pleural effusion, decreased compared to most recent CT. Minimal hazy atelectasis or resolving infiltrate in the right  lower lobe. 2. Stable bilateral pulmonary nodules including an 11 mm right upper lobe lung nodule, felt benign 3. No CT evidence for acute intra-abdominal or pelvic abnormality. 4. Mild sigmoid colon diverticular disease without acute inflammation. Electronically Signed   By: Donavan Foil M.D.   On: 06/03/2017 21:46   Ct Abdomen Pelvis Wo Contrast  Result Date: 05/23/2017 CLINICAL DATA:  Chest pain, abdominal distension EXAM: CT CHEST, ABDOMEN AND PELVIS WITHOUT CONTRAST TECHNIQUE: Multidetector CT imaging of the chest, abdomen and pelvis was performed following the standard protocol without IV contrast. COMPARISON:  Chest x-ray from earlier in the same day, ultrasound of the abdomen from 12/29/2016, CT of the chest from 07/11/2015 FINDINGS: CT CHEST FINDINGS Cardiovascular: Atherosclerotic calcifications are noted. No aneurysmal dilatation is identified. No cardiac enlargement is seen. Mitral valve calcifications are noted. Coronary calcifications are seen. No pericardial effusion is noted. Mediastinum/Nodes: Thoracic inlet is within normal limits. The esophagus is mildly dilated with air proximally although no definitive foreign body is seen. Multiple calcified hilar and mediastinal lymph nodes are noted consistent with prior granulomatous disease. No significant axillary adenopathy is noted. Lungs/Pleura: Moderate size right-sided pleural effusion is seen. Very mild infiltrative changes are noted in the right lower lobe. Scattered pulmonary nodules are noted. One lies in the right lower lobe measuring approximately 5 mm. This is 1 mm larger than that seen in 2017 and given the difference in imaging technique likely unchanged. Additionally a somewhat spiculated soft tissue density is noted in the right suprahilar region best seen on image number 51 of series 4 measuring 1.2 cm. This is also stable from the prior exam and as well as previous exams and consistent with a benign etiology. Musculoskeletal:  Degenerative changes of the thoracic spine are noted. Postoperative changes in the cervical spine are seen. CT ABDOMEN PELVIS FINDINGS Hepatobiliary: No focal liver abnormality is seen. Status post cholecystectomy. No biliary dilatation. Pancreas: Unremarkable. No pancreatic ductal dilatation or surrounding inflammatory changes. Spleen: Normal in size without focal  abnormality. Adrenals/Urinary Tract: Adrenal glands and kidneys are within normal limits. No calculi are seen. The bladder is well distended. Stomach/Bowel: Scattered mild diverticular changes noted. No obstructive changes are seen. The appendix is not well visualized. No inflammatory changes are seen. Some inspissated barium is noted in what appears to be the central portion of the appendix. Correlate with any surgical history. No obstructive or inflammatory changes are seen. Vascular/Lymphatic: Aortic atherosclerosis. No enlarged abdominal or pelvic lymph nodes. Reproductive: Status post hysterectomy. No adnexal masses. Other: No abdominal wall hernia or abnormality. No abdominopelvic ascites. Musculoskeletal: Degenerative changes of the lumbar spine are noted. IMPRESSION: Changes consistent with prior granulomatous disease. Right-sided pleural effusion and mild right lower lobe infiltrate. Stable pulmonary nodules as described above consistent with benign etiology. Chronic changes without acute abnormality within the abdomen and pelvis. Electronically Signed   By: Inez Catalina M.D.   On: 05/23/2017 18:04   Dg Chest 2 View  Result Date: 06/03/2017 CLINICAL DATA:  Cough and weakness. Recently hospitalized with pneumonia. EXAM: CHEST  2 VIEW COMPARISON:  Earlier same day; 05/23/2017; chest CT-05/23/2017 FINDINGS: Unchanged cardiac silhouette and mediastinal contours. Slightly reduced lung volumes with associated bibasilar opacities, likely atelectasis. No pleural effusion or pneumothorax. No evidence of edema. There is minimal pleuroparenchymal  thickening about the right minor and bilateral major fissures, unchanged. No acute osseus abnormalities. Post lower cervical ACDF. Post cholecystectomy. IMPRESSION: Bibasilar atelectasis without superimposed acute cardiopulmonary disease. Specifically, no discrete focal airspace opacities to suggest pneumonia. Electronically Signed   By: Sandi Mariscal M.D.   On: 06/03/2017 17:11   Dg Chest 2 View  Result Date: 06/03/2017 CLINICAL DATA:  Cough and congestion. Left-sided chest pain patient was recently diagnosed with pneumonia. EXAM: CHEST  2 VIEW COMPARISON:  05/23/2017; 12/24/2016; 05/16/2016; chest CT-05/23/2017 FINDINGS: Grossly unchanged cardiac silhouette and mediastinal contours with partially calcified bilateral hilar and mediastinal lymph nodes. Overall improved aeration of the lungs with persistent bilateral infrahilar opacities favored to represent atelectasis. Suspected reduction in residual trace right-sided pleural effusion. No definite left-sided pleural effusion. No pneumothorax. There is a minimal amount of pleuroparenchymal thickening about the right minor fissure, unchanged. No evidence of edema. No acute osseous abnormalities. Post lower cervical ACDF, incompletely evaluated. Old fracture involving the posterolateral aspect the left fifth rib. Mild (approximately 25%) compression deformity involving the superior endplates of the C00 and T12 vertebral bodies, unchanged. IMPRESSION: Overall improved aeration of lungs with suspected trace residual right-sided effusion and associated bibasilar opacities, likely atelectasis. Electronically Signed   By: Sandi Mariscal M.D.   On: 06/03/2017 14:50   Dg Chest 2 View  Result Date: 05/23/2017 CLINICAL DATA:  Chest pain EXAM: CHEST  2 VIEW COMPARISON:  12/24/2016 FINDINGS: Low lung volumes. Bibasilar subsegmental atelectasis. Small right pleural effusion is suspected. Interstitial prominence. Normal vascularity. Chronic left rib deformities. IMPRESSION:  Bibasilar atelectasis Small right pleural effusion. Electronically Signed   By: Marybelle Killings M.D.   On: 05/23/2017 15:55   Ct Chest Wo Contrast  Result Date: 06/03/2017 CLINICAL DATA:  Weakness and cough EXAM: CT CHEST, ABDOMEN AND PELVIS WITHOUT CONTRAST TECHNIQUE: Multidetector CT imaging of the chest, abdomen and pelvis was performed following the standard protocol without IV contrast. COMPARISON:  Radiograph 06/03/2017, CT chest abdomen pelvis 05/23/2017, CT chest 07/11/2015, 01/11/2015, 01/24/2014 FINDINGS: CT CHEST FINDINGS Cardiovascular: Limited evaluation without intravenous contrast. Moderate aortic atherosclerosis. No aneurysmal dilatation. Coronary vessel calcification. Normal heart size. Trace pericardial effusion Mediastinum/Nodes: Midline trachea. No thyroid mass. Mild air distention of  the esophagus. Multiple calcified mediastinal and hilar lymph nodes consistent with prior granulomatous disease. Lungs/Pleura: Small right-sided pleural effusion, decreased compared to prior. Hazy atelectasis or resolving infiltrate at the right base. Bilateral pulmonary nodules again visualized. Stable 11 mm right upper lobe lung nodule since 2015, therefore felt benign. No new infiltrate. Musculoskeletal: Degenerative changes. No acute or suspicious lesion. CT ABDOMEN PELVIS FINDINGS Hepatobiliary: No focal liver abnormality is seen. Status post cholecystectomy. No biliary dilatation. Pancreas: Unremarkable. No pancreatic ductal dilatation or surrounding inflammatory changes. Spleen: Normal in size without focal abnormality. Adrenals/Urinary Tract: Adrenal glands are unremarkable. Kidneys are normal, without renal calculi, focal lesion, or hydronephrosis. Bladder is unremarkable. Stomach/Bowel: Stomach is within normal limits. Appendix not well seen but no right lower quadrant inflammatory process. No evidence of bowel wall thickening, distention, or inflammatory changes. Vascular/Lymphatic: Moderate aortic  atherosclerosis. No aneurysmal dilatation. Stable small retroperitoneal lymph nodes. Stable right cardio phrenic lymph nodes. Reproductive: Status post hysterectomy. No adnexal masses. Other: Negative for free air or free fluid. Small fat in the umbilical region Musculoskeletal: No acute or suspicious lesions. IMPRESSION: 1. Small right pleural effusion, decreased compared to most recent CT. Minimal hazy atelectasis or resolving infiltrate in the right lower lobe. 2. Stable bilateral pulmonary nodules including an 11 mm right upper lobe lung nodule, felt benign 3. No CT evidence for acute intra-abdominal or pelvic abnormality. 4. Mild sigmoid colon diverticular disease without acute inflammation. Electronically Signed   By: Donavan Foil M.D.   On: 06/03/2017 21:46   Ct Chest Wo Contrast  Result Date: 05/23/2017 CLINICAL DATA:  Chest pain, abdominal distension EXAM: CT CHEST, ABDOMEN AND PELVIS WITHOUT CONTRAST TECHNIQUE: Multidetector CT imaging of the chest, abdomen and pelvis was performed following the standard protocol without IV contrast. COMPARISON:  Chest x-ray from earlier in the same day, ultrasound of the abdomen from 12/29/2016, CT of the chest from 07/11/2015 FINDINGS: CT CHEST FINDINGS Cardiovascular: Atherosclerotic calcifications are noted. No aneurysmal dilatation is identified. No cardiac enlargement is seen. Mitral valve calcifications are noted. Coronary calcifications are seen. No pericardial effusion is noted. Mediastinum/Nodes: Thoracic inlet is within normal limits. The esophagus is mildly dilated with air proximally although no definitive foreign body is seen. Multiple calcified hilar and mediastinal lymph nodes are noted consistent with prior granulomatous disease. No significant axillary adenopathy is noted. Lungs/Pleura: Moderate size right-sided pleural effusion is seen. Very mild infiltrative changes are noted in the right lower lobe. Scattered pulmonary nodules are noted. One lies  in the right lower lobe measuring approximately 5 mm. This is 1 mm larger than that seen in 2017 and given the difference in imaging technique likely unchanged. Additionally a somewhat spiculated soft tissue density is noted in the right suprahilar region best seen on image number 51 of series 4 measuring 1.2 cm. This is also stable from the prior exam and as well as previous exams and consistent with a benign etiology. Musculoskeletal: Degenerative changes of the thoracic spine are noted. Postoperative changes in the cervical spine are seen. CT ABDOMEN PELVIS FINDINGS Hepatobiliary: No focal liver abnormality is seen. Status post cholecystectomy. No biliary dilatation. Pancreas: Unremarkable. No pancreatic ductal dilatation or surrounding inflammatory changes. Spleen: Normal in size without focal abnormality. Adrenals/Urinary Tract: Adrenal glands and kidneys are within normal limits. No calculi are seen. The bladder is well distended. Stomach/Bowel: Scattered mild diverticular changes noted. No obstructive changes are seen. The appendix is not well visualized. No inflammatory changes are seen. Some inspissated barium is noted in what  appears to be the central portion of the appendix. Correlate with any surgical history. No obstructive or inflammatory changes are seen. Vascular/Lymphatic: Aortic atherosclerosis. No enlarged abdominal or pelvic lymph nodes. Reproductive: Status post hysterectomy. No adnexal masses. Other: No abdominal wall hernia or abnormality. No abdominopelvic ascites. Musculoskeletal: Degenerative changes of the lumbar spine are noted. IMPRESSION: Changes consistent with prior granulomatous disease. Right-sided pleural effusion and mild right lower lobe infiltrate. Stable pulmonary nodules as described above consistent with benign etiology. Chronic changes without acute abnormality within the abdomen and pelvis. Electronically Signed   By: Inez Catalina M.D.   On: 05/23/2017 18:04    ECG &  Cardiac Imaging    Regular sinus rhythm, 64, first-degree AV block, left axis deviation, left anterior fascicular block, right bundle branch block, left ventricular hypertrophy.  No acute changes.  Assessment & Plan    1.  Chest pain/CAD:  S/p prior PCI's to the LAD, LCX, and RCA in [place] between 2010 and 2016.  Low risk MV in 11/2016 in the setting of chest pain that occurred while participating in cardiac rehab.  Recently admitted 1/12-1/15 with chest pain and found to have RLL PNA and there was also strong suspicion for pill esophagitis.  Following that admission, she continued to have weakness and intermittent sharp and somewhat fleeting left-sided chest pain.  This worsened yesterday, prompting presentation back to the emergency department.  Troponins have been normal.  On exam, she is very tender over her left chest.  In setting of atypical symptoms which appear to be musculoskeletal, normal troponins, and relatively recent negative stress test in July 2018, would not pursue additional ischemic evaluation at this time.  Continue aspirin, nitrate, beta-blocker, ARB, Brilinta, and statin therapy.  2.  Essential HTN: Blood pressure elevated thus far throughout admission, trending in the 130s-150s with peaks in the 170s.  I will titrate her nitrate.  3.  HL: LDL 60 in July.  Continue statin therapy.  4. Type II DM: Per IM. A1c 7.5 in May 2018.  5.  HFpEF: Volume stable.  Will adjust antihypertensives.  6.  OSA: Uses CPAP.  7.  Stage III chronic kidney disease: Stable.  Signed, Murray Hodgkins, NP 06/04/2017, 8:51 AM  For questions or updates, please contact   Please consult [email or web address] for contact info under Cardiology/STEMI.

## 2017-06-04 NOTE — ED Notes (Signed)
Report called to Alisa, RN

## 2017-06-04 NOTE — Care Management (Signed)
Discharge to home today per Dr. Posey Pronto. Unable to get in touch with husband, taxi voucher issued Shelbie Ammons RN MSN [facility] [phone]

## 2017-06-04 NOTE — Discharge Summary (Signed)
[facility] [place] [name]    MR#:  [id number]  [place] OF BIRTH:  15-Oct-1940  DATE OF ADMISSION:  06/03/2017 ADMITTING PHYSICIAN: Lance Coon, MD  DATE OF DISCHARGE: 06/04/2017  PRIMARY CARE PHYSICIAN: [name], MD    ADMISSION DIAGNOSIS:  Chest pain, unspecified type [R07.9]  DISCHARGE DIAGNOSIS:  Chest pain, atypical/musculoskeletal (reproducible pain on palpation)  SECONDARY DIAGNOSIS:   Past Medical History:  Diagnosis Date  . Acute respiratory failure ([place]) 11/21/2014  . Anxiety   . Arthritis 08/05/2013  . Atherosclerotic peripheral vascular disease with gangrene [facility]) august 2012  . Cardiomyopathy, ischemic    a. EF 35 to 40% by echo in 2013 b. EF improved to 50-55% by echo in 04/2015; 11/2016 Echo: EF 55-60%, Gr1 DD, mildly dil LA, nl RV fxn.  . Cerebral infarct ([place]) 08/17/2013  . Chronic diastolic CHF (congestive heart failure) (HCC)    a. EF 50-55% by echo in 04/2015; b. 11/2016 Echo: EF 55-60%, Gr1 DD.  [name] COPD (chronic obstructive pulmonary disease) ([place])   . Coronary artery disease, occlusive    a. Previous PCI to the LAD, LCx, and RCA in 2010, 2011, 2013, and 2016. All performed in [place]; b. 11/2016 Lexiscan MV: EF 43%, no ischemia->low risk.  . 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 Diabetic diarrhea ([place]) 10/03/2014  . DM type 2, uncontrolled, with renal complications ([place]) 07/15/6292  . Hepatic steatosis    by CT abd pelvis  . History of kidney stones    at a younger age  . Hyperlipidemia LDL goal <100 02/23/2014  . Hypertensive heart disease   . Osteoporosis, post-menopausal   . 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 Pleural effusion 10/25/2012   10/2012 CT chest >> small to moderate R lung effusion>> chylothorax, 100% lymphs 10/2013 thoracentesis> cytology negative, WBC 1471, > 90% "small lymphs" 01/2014 CT chest>  near complete resolution of pleural effusion, stable lymphadenopathy 08/2014 CT chest [place] ([facility]): small right sided effusion decreased in size, stable mediastinal lymphadenopathy 1.0cm largest, 73m  . 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 Sleep apnea   . Stage III chronic kidney disease [facility])     HOSPITAL COURSE:   Pamela Collier  is a 77 y.o. female who presents with central chest discomfort.  Patient states that she took her medicine, and shortly afterwards started having this chest discomfort.  Workup here in the ED largely rules out cardiac source with 3 neg troponins  1.  Chest pain with history of CAD status post prior PCI to LAD, left circumflex and RCA in remote past. -Patient has reproducible chest pain on the left sternal area -Cardiac enzymes have been negative. -PRN Tylenol helps patient -No evidence of pneumonia on CT chest done on 06/03/2017.  Patient has no clinical symptoms of pneumonia. -She was just treated with antibiotic during her last admission in December -She is afebrile white count is normal -Cardiology consultation placed with Dr. Fletcher Anon.  No workup planned.  Patient had stress test in 2018  2.  Hypertension continue home meds  3.  8  hyperlipidemia on statin  4.  Type 2 diabetes continue home meds  5.  Chronic kidney disease stage III stable  Discharge home later. CONSULTS OBTAINED:  Treatment Team:  Wellington Hampshire, MD  DRUG ALLERGIES:   Allergies  Allergen Reactions  . Cephalexin Itching  . Contrast Media [Iodinated Diagnostic Agents] Itching  . Doxycycline Itching  . Gabapentin Itching  . Sulfa Antibiotics Itching    DISCHARGE MEDICATIONS:   Allergies as of 06/04/2017      Reactions    Cephalexin Itching   Contrast Media [iodinated Diagnostic Agents] Itching   Doxycycline Itching   Gabapentin Itching   Sulfa Antibiotics Itching      Medication List    STOP taking these medications   levofloxacin 750 MG tablet Commonly known as:  LEVAQUIN   traMADol 50 MG tablet Commonly known as:  ULTRAM     TAKE these medications   acetaminophen 500 MG tablet Commonly known as:  TYLENOL Take 1,000 mg by mouth every 6 (six) hours as needed for mild pain or headache.   alendronate 70 MG tablet Commonly known as:  FOSAMAX Take 1 tablet (70 mg total) by mouth once a week. Pt takes on Sunday.   Take with a full glass of water on an empty stomach.   ALPRAZolam 0.25 MG tablet Commonly known as:  XANAX Take 1 tablet (0.25 mg total) by mouth daily.   aspirin EC 81 MG tablet Take 81 mg by mouth daily.   B-D UF III MINI PEN NEEDLES 31G X 5 MM Misc Generic drug:  Insulin Pen Needle USE THREE TIMES A DAY   BRILINTA 90 MG Tabs tablet Generic drug:  ticagrelor Take 1 tablet (90 mg total) by mouth 2 (two) times daily.   budesonide-formoterol 160-4.5 MCG/ACT inhaler Commonly known as:  SYMBICORT Inhale 2 puffs into the lungs 2 (two) times daily.   diclofenac sodium 1 % Gel Commonly known as:  VOLTAREN Apply 2 g topically 4 (four) times daily.   docusate sodium 100 MG capsule Commonly known as:  COLACE Take 100 mg by mouth 2 (two) times daily as needed for mild constipation. Reported on 11/09/2015   feeding supplement (GLUCERNA SHAKE) Liqd Take 237 mLs by mouth 2 (two) times daily between meals.   fluticasone 50 MCG/ACT nasal spray Commonly known as:  FLONASE Place 2 sprays into both nostrils daily as needed for rhinitis.   furosemide 20 MG tablet Commonly known as:  LASIX Take 1 tablet (20 mg total) by mouth every other day.   glucose blood test strip Commonly known as:  ACCU-CHEK AVIVA PLUS USE TO CHECK GLUCOSE  THREE TIMES DAILY   guaiFENesin-dextromethorphan  100-10 MG/5ML syrup Commonly known as:  ROBITUSSIN DM Take 10 mLs by mouth every 6 (six) hours as needed for cough.   HYDROcodone-acetaminophen 5-325 MG tablet Commonly known as:  NORCO/VICODIN Take 1-2 tablets by mouth every 4 (four) hours as needed (breakthrough pain).   Insulin Lispro Prot & Lispro (75-25) 100 UNIT/ML Kwikpen Commonly known as:  HUMALOG MIX 75/25 KWIKPEN Inject 30 Units into the skin 2 (two) times daily with a meal. 44 units in the morning and 60 units in the evening   isosorbide mononitrate 30 MG 24 hr tablet Commonly known as:  IMDUR TAKE 1 TABLET DAILY   losartan 100 MG tablet Commonly known as:  COZAAR TAKE 1 TABLET DAILY   LYRICA 100 MG capsule Generic drug:  pregabalin TAKE ONE CAPSULE BY MOUTH THREE TIMES DAILY  metoprolol succinate 25 MG 24 hr tablet Commonly known as:  TOPROL-XL Take 1 tablet (25 mg total) by mouth daily.   nitroGLYCERIN 0.4 MG/SPRAY spray Commonly known as:  NITROLINGUAL Place 1 spray under the tongue every 5 (five) minutes x 3 doses as needed for chest pain.   ondansetron 4 MG tablet Commonly known as:  ZOFRAN Take 1 tablet (4 mg total) by mouth every 8 (eight) hours as needed for nausea or vomiting.   PARoxetine 20 MG tablet Commonly known as:  PAXIL TAKE ONE TABLET BY MOUTH ONCE DAILY   polyethylene glycol packet Commonly known as:  MIRALAX / GLYCOLAX Take 17 g by mouth daily as needed for mild constipation. Reported on 10/25/2015   rosuvastatin 20 MG tablet Commonly known as:  CRESTOR Take 20 mg by mouth daily.       If you experience worsening of your admission symptoms, develop shortness of breath, life threatening emergency, suicidal or homicidal thoughts you must seek medical attention immediately by calling 911 or calling your MD immediately  if symptoms less severe.  You Must read complete instructions/literature along with all the possible adverse reactions/side effects for all the Medicines you take and  that have been prescribed to you. Take any new Medicines after you have completely understood and accept all the possible adverse reactions/side effects.   Please note  You were cared for by a hospitalist during your hospital stay. If you have any questions about your discharge medications or the care you received while you were in the hospital after you are discharged, you can call the unit and asked to speak with the hospitalist on call if the hospitalist that took care of you is not available. Once you are discharged, your primary care physician will handle any further medical issues. Please note that NO REFILLS for any discharge medications will be authorized once you are discharged, as it is imperative that you return to your primary care physician (or establish a relationship with a primary care physician if you do not have one) for your aftercare needs so that they can reassess your need for medications and monitor your lab values. Today   SUBJECTIVE   Left chest mild pain earlier  VITAL SIGNS:  Blood pressure (!) 156/56, pulse 65, temperature 98.5 F (36.9 C), temperature source Oral, resp. rate 18, height 5\' 7"  (1.702 m), weight 84.8 kg (186 lb 14.4 oz), SpO2 100 %.  I/O:    Intake/Output Summary (Last 24 hours) at 06/04/2017 1355 Last data filed at 06/04/2017 0500 Gross per 24 hour  Intake 1000 ml  Output 600 ml  Net 400 ml    PHYSICAL EXAMINATION:  GENERAL:  77 y.o.-year-old patient lying in the bed with no acute distress. obese EYES: Pupils equal, round, reactive to light and accommodation. No scleral icterus. Extraocular muscles intact.  HEENT: Head atraumatic, normocephalic. Oropharynx and nasopharynx clear.  NECK:  Supple, no jugular venous distention. No thyroid enlargement, no tenderness.  LUNGS: Normal breath sounds bilaterally, no wheezing, rales,rhonchi or crepitation. No use of accessory muscles of respiration.  CARDIOVASCULAR: S1, S2 normal. No murmurs, rubs, or  gallops.  ABDOMEN: Soft, non-tender, non-distended. Bowel sounds present. No organomegaly or mass.  EXTREMITIES: No pedal edema, cyanosis, or clubbing.  NEUROLOGIC: Cranial nerves II through XII are intact. Muscle strength 5/5 in all extremities. Sensation intact. Gait not checked.  PSYCHIATRIC: The patient is alert and oriented x 3.  SKIN: No obvious rash, lesion, or ulcer.   DATA REVIEW:  CBC  Recent Labs  Lab 06/04/17 0801  WBC 5.9  HGB 11.8*  HCT 35.7  PLT 239    Chemistries  Recent Labs  Lab 06/03/17 1421  06/04/17 0801  NA 137   < > 139  K 4.2   < > 4.2  CL 106   < > 108  CO2 25   < > 26  GLUCOSE 109*   < > 103*  BUN 32*   < > 26*  CREATININE 1.55*   < > 1.35*  CALCIUM 9.0   < > 9.1  AST 17  --   --   ALT 11*  --   --   ALKPHOS 58  --   --   BILITOT 0.6  --   --    < > = values in this interval not displayed.    Microbiology Results   No results found for this or any previous visit (from the past 240 hour(s)).  RADIOLOGY:  Ct Abdomen Pelvis Wo Contrast  Result Date: 06/03/2017 CLINICAL DATA:  Weakness and cough EXAM: CT CHEST, ABDOMEN AND PELVIS WITHOUT CONTRAST TECHNIQUE: Multidetector CT imaging of the chest, abdomen and pelvis was performed following the standard protocol without IV contrast. COMPARISON:  Radiograph 06/03/2017, CT chest abdomen pelvis 05/23/2017, CT chest 07/11/2015, 01/11/2015, 01/24/2014 FINDINGS: CT CHEST FINDINGS Cardiovascular: Limited evaluation without intravenous contrast. Moderate aortic atherosclerosis. No aneurysmal dilatation. Coronary vessel calcification. Normal heart size. Trace pericardial effusion Mediastinum/Nodes: Midline trachea. No thyroid mass. Mild air distention of the esophagus. Multiple calcified mediastinal and hilar lymph nodes consistent with prior granulomatous disease. Lungs/Pleura: Small right-sided pleural effusion, decreased compared to prior. Hazy atelectasis or resolving infiltrate at the right base.  Bilateral pulmonary nodules again visualized. Stable 11 mm right upper lobe lung nodule since 2015, therefore felt benign. No new infiltrate. Musculoskeletal: Degenerative changes. No acute or suspicious lesion. CT ABDOMEN PELVIS FINDINGS Hepatobiliary: No focal liver abnormality is seen. Status post cholecystectomy. No biliary dilatation. Pancreas: Unremarkable. No pancreatic ductal dilatation or surrounding inflammatory changes. Spleen: Normal in size without focal abnormality. Adrenals/Urinary Tract: Adrenal glands are unremarkable. Kidneys are normal, without renal calculi, focal lesion, or hydronephrosis. Bladder is unremarkable. Stomach/Bowel: Stomach is within normal limits. Appendix not well seen but no right lower quadrant inflammatory process. No evidence of bowel wall thickening, distention, or inflammatory changes. Vascular/Lymphatic: Moderate aortic atherosclerosis. No aneurysmal dilatation. Stable small retroperitoneal lymph nodes. Stable right cardio phrenic lymph nodes. Reproductive: Status post hysterectomy. No adnexal masses. Other: Negative for free air or free fluid. Small fat in the umbilical region Musculoskeletal: No acute or suspicious lesions. IMPRESSION: 1. Small right pleural effusion, decreased compared to most recent CT. Minimal hazy atelectasis or resolving infiltrate in the right lower lobe. 2. Stable bilateral pulmonary nodules including an 11 mm right upper lobe lung nodule, felt benign 3. No CT evidence for acute intra-abdominal or pelvic abnormality. 4. Mild sigmoid colon diverticular disease without acute inflammation. Electronically Signed   By: Donavan Foil M.D.   On: 06/03/2017 21:46   Dg Chest 2 View  Result Date: 06/03/2017 CLINICAL DATA:  Cough and weakness. Recently hospitalized with pneumonia. EXAM: CHEST  2 VIEW COMPARISON:  Earlier same day; 05/23/2017; chest CT-05/23/2017 FINDINGS: Unchanged cardiac silhouette and mediastinal contours. Slightly reduced lung volumes  with associated bibasilar opacities, likely atelectasis. No pleural effusion or pneumothorax. No evidence of edema. There is minimal pleuroparenchymal thickening about the right minor and bilateral major fissures, unchanged. No acute osseus abnormalities. Post lower cervical  ACDF. Post cholecystectomy. IMPRESSION: Bibasilar atelectasis without superimposed acute cardiopulmonary disease. Specifically, no discrete focal airspace opacities to suggest pneumonia. Electronically Signed   By: Sandi Mariscal M.D.   On: 06/03/2017 17:11   Dg Chest 2 View  Result Date: 06/03/2017 CLINICAL DATA:  Cough and congestion. Left-sided chest pain patient was recently diagnosed with pneumonia. EXAM: CHEST  2 VIEW COMPARISON:  05/23/2017; 12/24/2016; 05/16/2016; chest CT-05/23/2017 FINDINGS: Grossly unchanged cardiac silhouette and mediastinal contours with partially calcified bilateral hilar and mediastinal lymph nodes. Overall improved aeration of the lungs with persistent bilateral infrahilar opacities favored to represent atelectasis. Suspected reduction in residual trace right-sided pleural effusion. No definite left-sided pleural effusion. No pneumothorax. There is a minimal amount of pleuroparenchymal thickening about the right minor fissure, unchanged. No evidence of edema. No acute osseous abnormalities. Post lower cervical ACDF, incompletely evaluated. Old fracture involving the posterolateral aspect the left fifth rib. Mild (approximately 25%) compression deformity involving the superior endplates of the E26 and T12 vertebral bodies, unchanged. IMPRESSION: Overall improved aeration of lungs with suspected trace residual right-sided effusion and associated bibasilar opacities, likely atelectasis. Electronically Signed   By: Sandi Mariscal M.D.   On: 06/03/2017 14:50   Ct Chest Wo Contrast  Result Date: 06/03/2017 CLINICAL DATA:  Weakness and cough EXAM: CT CHEST, ABDOMEN AND PELVIS WITHOUT CONTRAST TECHNIQUE: Multidetector  CT imaging of the chest, abdomen and pelvis was performed following the standard protocol without IV contrast. COMPARISON:  Radiograph 06/03/2017, CT chest abdomen pelvis 05/23/2017, CT chest 07/11/2015, 01/11/2015, 01/24/2014 FINDINGS: CT CHEST FINDINGS Cardiovascular: Limited evaluation without intravenous contrast. Moderate aortic atherosclerosis. No aneurysmal dilatation. Coronary vessel calcification. Normal heart size. Trace pericardial effusion Mediastinum/Nodes: Midline trachea. No thyroid mass. Mild air distention of the esophagus. Multiple calcified mediastinal and hilar lymph nodes consistent with prior granulomatous disease. Lungs/Pleura: Small right-sided pleural effusion, decreased compared to prior. Hazy atelectasis or resolving infiltrate at the right base. Bilateral pulmonary nodules again visualized. Stable 11 mm right upper lobe lung nodule since 2015, therefore felt benign. No new infiltrate. Musculoskeletal: Degenerative changes. No acute or suspicious lesion. CT ABDOMEN PELVIS FINDINGS Hepatobiliary: No focal liver abnormality is seen. Status post cholecystectomy. No biliary dilatation. Pancreas: Unremarkable. No pancreatic ductal dilatation or surrounding inflammatory changes. Spleen: Normal in size without focal abnormality. Adrenals/Urinary Tract: Adrenal glands are unremarkable. Kidneys are normal, without renal calculi, focal lesion, or hydronephrosis. Bladder is unremarkable. Stomach/Bowel: Stomach is within normal limits. Appendix not well seen but no right lower quadrant inflammatory process. No evidence of bowel wall thickening, distention, or inflammatory changes. Vascular/Lymphatic: Moderate aortic atherosclerosis. No aneurysmal dilatation. Stable small retroperitoneal lymph nodes. Stable right cardio phrenic lymph nodes. Reproductive: Status post hysterectomy. No adnexal masses. Other: Negative for free air or free fluid. Small fat in the umbilical region Musculoskeletal: No acute  or suspicious lesions. IMPRESSION: 1. Small right pleural effusion, decreased compared to most recent CT. Minimal hazy atelectasis or resolving infiltrate in the right lower lobe. 2. Stable bilateral pulmonary nodules including an 11 mm right upper lobe lung nodule, felt benign 3. No CT evidence for acute intra-abdominal or pelvic abnormality. 4. Mild sigmoid colon diverticular disease without acute inflammation. Electronically Signed   By: Donavan Foil M.D.   On: 06/03/2017 21:46     Management plans discussed with the patient, family and they are in agreement.  CODE STATUS:     Code Status Orders  (From admission, onward)        Start     Ordered  06/04/17 0152  Full code  Continuous     06/04/17 0151    Code Status History    Date Active Date Inactive Code Status Order ID Comments User Context   05/24/2017 01:29 05/26/2017 19:26 Full Code [id number]  Lance Coon, MD Inpatient   11/14/2016 22:49 11/15/2016 18:29 Full Code [id number]  Harvie Bridge, DO ED   10/10/2016 13:25 10/13/2016 20:20 Full Code [id number]  Earnie Larsson, MD Inpatient   06/11/2016 21:42 06/12/2016 19:23 Full Code [id number]  Harvie Bridge, DO ED   01/02/2016 05:24 01/02/2016 09:14 Full Code [id number]  Saundra Shelling, MD Inpatient   06/26/2015 07:36 06/29/2015 19:54 Full Code [id number]  Demetrios Loll, MD ED   04/20/2015 01:18 04/22/2015 19:18 Full Code [id number]  Lance Coon, MD Inpatient    Advance Directive Documentation     Most Recent Value  Type of Advance Directive  Healthcare Power of Attorney, Living will  Pre-existing out of facility DNR order (yellow form or pink MOST form)  No data  "MOST" Form in Place?  No data      TOTAL TIME TAKING CARE OF THIS PATIENT: *40* minutes.    Fritzi Mandes M.D on 06/04/2017 at 1:55 PM  Between 7am to 6pm - Pager - [phone] After 6pm go to [email or web address] - password [email or web address] [facility]  Office  [phone]  CC: Primary care physician; [name], MD

## 2017-06-04 NOTE — Telephone Encounter (Signed)
Refilled: 04/29/2016 Last OV: 12/22/2016 Next OV: 06/08/2017

## 2017-06-04 NOTE — Care Management Obs Status (Signed)
[facility] NOTIFICATION   Patient Details  [name] MRN: [id number] Date of Birth: [birth year]   Medicare Observation Status Notification Given:  No(admitted obs less than 24 hours)    Beverly Sessions, RN 06/04/2017, 2:10 PM

## 2017-06-05 ENCOUNTER — Other Ambulatory Visit: Payer: Self-pay | Admitting: Internal Medicine

## 2017-06-05 ENCOUNTER — Telehealth: Payer: Self-pay | Admitting: Internal Medicine

## 2017-06-05 DIAGNOSIS — I255 Ischemic cardiomyopathy: Secondary | ICD-10-CM

## 2017-06-05 NOTE — Telephone Encounter (Signed)
Printed, signed and faxed.  

## 2017-06-05 NOTE — Telephone Encounter (Signed)
Patient contacted for TCM, patient will keep original appointment .

## 2017-06-06 ENCOUNTER — Telehealth: Payer: Self-pay

## 2017-06-06 NOTE — Telephone Encounter (Signed)
I called pt for f/u. She was hospitalized for one day and sent home. Has f/u with PCP on Mon 1/28 and with cardiology on 1/29.

## 2017-06-08 ENCOUNTER — Ambulatory Visit (INDEPENDENT_AMBULATORY_CARE_PROVIDER_SITE_OTHER): Payer: Medicare Other | Admitting: Internal Medicine

## 2017-06-08 ENCOUNTER — Encounter: Payer: Self-pay | Admitting: Internal Medicine

## 2017-06-08 VITALS — BP 110/56 | HR 75 | Temp 98.4°F | Resp 16 | Ht 67.0 in | Wt 184.2 lb

## 2017-06-08 DIAGNOSIS — I129 Hypertensive chronic kidney disease with stage 1 through stage 4 chronic kidney disease, or unspecified chronic kidney disease: Secondary | ICD-10-CM | POA: Diagnosis not present

## 2017-06-08 DIAGNOSIS — K208 Other esophagitis without bleeding: Secondary | ICD-10-CM

## 2017-06-08 DIAGNOSIS — N644 Mastodynia: Secondary | ICD-10-CM | POA: Diagnosis not present

## 2017-06-08 DIAGNOSIS — R531 Weakness: Secondary | ICD-10-CM

## 2017-06-08 DIAGNOSIS — E1321 Other specified diabetes mellitus with diabetic nephropathy: Secondary | ICD-10-CM

## 2017-06-08 DIAGNOSIS — Z09 Encounter for follow-up examination after completed treatment for conditions other than malignant neoplasm: Secondary | ICD-10-CM | POA: Diagnosis not present

## 2017-06-08 DIAGNOSIS — M810 Age-related osteoporosis without current pathological fracture: Secondary | ICD-10-CM

## 2017-06-08 DIAGNOSIS — N183 Chronic kidney disease, stage 3 (moderate): Secondary | ICD-10-CM

## 2017-06-08 DIAGNOSIS — E1122 Type 2 diabetes mellitus with diabetic chronic kidney disease: Secondary | ICD-10-CM | POA: Diagnosis not present

## 2017-06-08 LAB — COMPREHENSIVE METABOLIC PANEL
ALBUMIN: 3.6 g/dL (ref 3.5–5.2)
ALT: 9 U/L (ref 0–35)
AST: 15 U/L (ref 0–37)
Alkaline Phosphatase: 52 U/L (ref 39–117)
BILIRUBIN TOTAL: 0.8 mg/dL (ref 0.2–1.2)
BUN: 27 mg/dL — AB (ref 6–23)
CALCIUM: 10.1 mg/dL (ref 8.4–10.5)
CHLORIDE: 104 meq/L (ref 96–112)
CO2: 29 mEq/L (ref 19–32)
CREATININE: 1.48 mg/dL — AB (ref 0.40–1.20)
GFR: 43.97 mL/min — ABNORMAL LOW (ref >60.00)
Glucose, Bld: 170 mg/dL — ABNORMAL HIGH (ref 70–99)
Potassium: 3.9 mEq/L (ref 3.5–5.1)
SODIUM: 140 meq/L (ref 135–145)
TOTAL PROTEIN: 7.5 g/dL (ref 6.0–8.3)

## 2017-06-08 LAB — HEMOGLOBIN A1C: Hgb A1c MFr Bld: 7.5 % — ABNORMAL HIGH (ref 4.6–6.5)

## 2017-06-08 MED ORDER — PANTOPRAZOLE SODIUM 40 MG PO TBEC
40.0000 mg | DELAYED_RELEASE_TABLET | Freq: Two times a day (BID) | ORAL | 1 refills | Status: DC
Start: 2017-06-08 — End: 2017-08-25

## 2017-06-08 NOTE — Progress Notes (Signed)
Subjective:  Patient ID: Pamela Collier, female    DOB: 1940-11-17  Age: 77 y.o. MRN: [id number]  CC: The primary encounter diagnosis was Pill esophagitis. 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  HPI Pamela Collier presents for hospital  follow up. Patient has had 2 admissions in the past month.     Admitted on Jan 12 with chest pain that reportedly occurred after swallowing her medications .  Patient takes alendronate once weekly.  After ruling out for AMI she was diagnosed with pill esophagitis and possible CAP due to right basilar opacities.  She  was treated for CAP and discharged home on January 15  with levaquin and viscous lidocaine.  Alendronate was not stopped (per dc instructions reviewed by me today). Viscous lidocaine was not filled per discussion with community pharmacist   She was readmitted jan 23 with reproducible chest pain.  She again ruled out for AMI , pneumonia and PE with a CT chest.  She was discharged home on Jan 24 , alendronate was still not discontinued per DC summary . Because her chest wall was tender in her left breast, she was advised to see PCP for breast exam   Patient continues to have pain substernal chestpain  that radiates to the left chest wall.  She feels very weak, and cotinues to have dyspne with mininal effort.  Her weakness is generalized,  And she is concerned about increased tremor of arms and legs .  ALSO SHORT OF BREATH WITH MINIMAL EFFORT . ("I'm shaky " ) .  She has type 2 DM but reports that her  Blood sugars have not been lower than 100.      Outpatient Medications Prior to Visit  Medication Sig Dispense Refill  . acetaminophen (TYLENOL) 500 MG tablet Take 1,000 mg by mouth every 6 (six) hours as needed for mild pain or headache.     .  ALPRAZolam (XANAX) 0.25 MG tablet Take 1 tablet (0.25 mg total) by mouth daily. 30 tablet 2  . B-D UF III MINI PEN NEEDLES 31G X 5 MM MISC USE THREE TIMES A DAY 270 each 3  . BRILINTA 90 MG TABS tablet Take 1 tablet (90 mg total) by mouth 2 (two) times daily. 180 tablet 0  . budesonide-formoterol (SYMBICORT) 160-4.5 MCG/ACT inhaler Inhale 2 puffs into the lungs 2 (two) times daily. 1 Inhaler 0  . diclofenac sodium (VOLTAREN) 1 % GEL Apply 2 g topically 4 (four) times daily.    [name] docusate sodium (COLACE) 100 MG capsule Take 100 mg by mouth 2 (two) times daily as needed for mild constipation. Reported on 11/09/2015    . feeding supplement, GLUCERNA SHAKE, (GLUCERNA SHAKE) LIQD Take 237 mLs by mouth 2 (two) times daily between meals. 60 Can 0  . fluticasone (FLONASE) 50 MCG/ACT nasal spray Place 2 sprays into both nostrils daily as needed for rhinitis.    . furosemide (LASIX) 20 MG tablet Take 1 tablet (20 mg total) by mouth every other day. 30 tablet 0  . glucose blood (ACCU-CHEK AVIVA PLUS) test strip USE TO CHECK GLUCOSE  THREE TIMES DAILY 300 each 3  . guaiFENesin-dextromethorphan (ROBITUSSIN DM) 100-10 MG/5ML syrup Take 10 mLs by mouth every 6 (six) hours as needed for cough. 118 mL 0  . HYDROcodone-acetaminophen (NORCO/VICODIN) 5-325 MG tablet Take 1-2  tablets by mouth every 4 (four) hours as needed (breakthrough pain). 30 tablet 0  . Insulin Lispro Prot & Lispro (HUMALOG MIX 75/25 KWIKPEN) (75-25) 100 UNIT/ML Kwikpen Inject 30 Units into the skin 2 (two) times daily with a meal. 44 units in the morning and 60 units in the evening 15 mL 11  . isosorbide mononitrate (IMDUR) 30 MG 24 hr tablet TAKE 1 TABLET DAILY 90 tablet 1  . losartan (COZAAR) 100 MG tablet TAKE 1 TABLET DAILY 90 tablet 1  . LYRICA 100 MG capsule TAKE 1 CAPSULE BY MOUTH THREE TIMES DAILY 90 capsule 5  . metoprolol succinate (TOPROL-XL) 25 MG 24 hr tablet Take 1 tablet (25 mg total) by mouth daily. 90 tablet 3  . nitroGLYCERIN  (NITROLINGUAL) 0.4 MG/SPRAY spray Place 1 spray under the tongue every 5 (five) minutes x 3 doses as needed for chest pain. 12 g 12  . ondansetron (ZOFRAN) 4 MG tablet Take 1 tablet (4 mg total) by mouth every 8 (eight) hours as needed for nausea or vomiting. 20 tablet 0  . PARoxetine (PAXIL) 20 MG tablet TAKE ONE TABLET BY MOUTH ONCE DAILY 90 tablet 1  . polyethylene glycol (MIRALAX / GLYCOLAX) packet Take 17 g by mouth daily as needed for mild constipation. Reported on 10/25/2015    . rosuvastatin (CRESTOR) 20 MG tablet Take 20 mg by mouth daily.     [name] alendronate (FOSAMAX) 70 MG tablet Take 1 tablet (70 mg total) by mouth once a week. Pt takes on Sunday.   Take with a full glass of water on an empty stomach. (Patient not taking: Reported on 06/09/2017) 4 tablet 11  . aspirin EC 81 MG tablet Take 81 mg by mouth daily.     Facility-Administered Medications Prior to Visit  Medication Dose Route Frequency Provider Last Rate Last Dose  . albuterol (PROVENTIL) (5 MG/ML) 0.5% nebulizer solution 2.5 mg  2.5 mg Nebulization Once [name], MD        Review of Systems;  Patient denies headache, fevers, malaise, unintentional weight loss, skin rash, eye pain, sinus congestion and sinus pain, sore throat, dysphagia,  hemoptysis , cough,  wheezing,  palpitations, orthopnea, edema, abdominal pain, nausea, melena, diarrhea, constipation, flank pain, dysuria, hematuria, urinary  Frequency, nocturia, numbness, tingling, seizures,  Focal weakness, Loss of consciousness, insomnia, depression, anxiety, and suicidal ideation.     Objective:  BP (!) 110/56 (BP Location: Left Arm, Patient Position: Sitting, Cuff Size: Normal)   Pulse 75   Temp 98.4 F (36.9 C) (Oral)   Resp 16   Ht _0  (1.702 m)   Wt 184 lb 3.2 oz (83.6 kg)   SpO2 96%   BMI 28.85 kg/m   BP Readings from Last 3 Encounters:  06/09/17 (!) 116/52  06/08/17 (!) 110/56  06/04/17 (!) 156/56    Wt Readings from Last 3 Encounters:    06/09/17 185 lb 12 oz (84.3 kg)  06/08/17 184 lb 3.2 oz (83.6 kg)  06/04/17 186 lb 14.4 oz (84.8 kg)   General appearance: alert, cooperative and appears stated age Head: Normocephalic, without obvious abnormality, atraumatic Eyes: conjunctivae/corneas clear. PERRL, EOM's intact. Fundi benign. Ears: normal TM's and external ear canals both ears Nose: Nares normal. Septum midline. Mucosa normal. No drainage or sinus tenderness. Throat: lips, mucosa, and tongue normal; teeth and gums normal Neck: no adenopathy, no carotid bruit, no JVD, supple, symmetrical, trachea midline and thyroid not enlarged, symmetric, no tenderness/mass/nodules Lungs: clear to auscultation bilaterally  Breasts: normal appearance, no masses or tenderness Heart: regular rate and rhythm, S1, S2 normal, no murmur, click, rub or gallop Abdomen: soft, non-tender; bowel sounds normal; no masses,  no organomegaly Extremities: extremities normal, atraumatic, no cyanosis or edema Pulses: 2+ and symmetric Skin: Skin color, texture, turgor normal. No rashes or lesions Neurologic: Alert and oriented X 3, normal strength and tone. Normal symmetric reflexes. Normal coordination and gait.  coarse intention tremor brought on with effort   Lab Results  Component Value Date   HGBA1C 7.5 (H) 06/08/2017   HGBA1C 7.5 (H) 10/08/2016   HGBA1C 7.1 06/18/2016    Lab Results  Component Value Date   CREATININE 1.48 (H) 06/08/2017   CREATININE 1.35 (H) 06/04/2017   CREATININE 1.54 (H) 06/03/2017    Lab Results  Component Value Date   WBC 5.9 06/04/2017   HGB 11.8 (L) 06/04/2017   HCT 35.7 06/04/2017   PLT 239 06/04/2017   GLUCOSE 170 (H) 06/08/2017   CHOL 124 11/15/2016   TRIG 175 (H) 11/15/2016   HDL 29 (L) 11/15/2016   LDLDIRECT 102.0 03/14/2016   LDLCALC 60 11/15/2016   ALT 9 06/08/2017   AST 15 06/08/2017   NA 140 06/08/2017   K 3.9 06/08/2017   CL 104 06/08/2017   CREATININE 1.48 (H) 06/08/2017   BUN 27 (H)  06/08/2017   CO2 29 06/08/2017   TSH 0.953 11/15/2016   INR 1.2 (H) 02/02/2015   HGBA1C 7.5 (H) 06/08/2017   MICROALBUR 28.9 (H) 09/26/2015    Ct Abdomen Pelvis Wo Contrast  Result Date: 06/03/2017 CLINICAL DATA:  Weakness and cough EXAM: CT CHEST, ABDOMEN AND PELVIS WITHOUT CONTRAST TECHNIQUE: Multidetector CT imaging of the chest, abdomen and pelvis was performed following the standard protocol without IV contrast. COMPARISON:  Radiograph 06/03/2017, CT chest abdomen pelvis 05/23/2017, CT chest 07/11/2015, 01/11/2015, 01/24/2014 FINDINGS: CT CHEST FINDINGS Cardiovascular: Limited evaluation without intravenous contrast. Moderate aortic atherosclerosis. No aneurysmal dilatation. Coronary vessel calcification. Normal heart size. Trace pericardial effusion Mediastinum/Nodes: Midline trachea. No thyroid mass. Mild air distention of the esophagus. Multiple calcified mediastinal and hilar lymph nodes consistent with prior granulomatous disease. Lungs/Pleura: Small right-sided pleural effusion, decreased compared to prior. Hazy atelectasis or resolving infiltrate at the right base. Bilateral pulmonary nodules again visualized. Stable 11 mm right upper lobe lung nodule since 2015, therefore felt benign. No new infiltrate. Musculoskeletal: Degenerative changes. No acute or suspicious lesion. CT ABDOMEN PELVIS FINDINGS Hepatobiliary: No focal liver abnormality is seen. Status post cholecystectomy. No biliary dilatation. Pancreas: Unremarkable. No pancreatic ductal dilatation or surrounding inflammatory changes. Spleen: Normal in size without focal abnormality. Adrenals/Urinary Tract: Adrenal glands are unremarkable. Kidneys are normal, without renal calculi, focal lesion, or hydronephrosis. Bladder is unremarkable. Stomach/Bowel: Stomach is within normal limits. Appendix not well seen but no right lower quadrant inflammatory process. No evidence of bowel wall thickening, distention, or inflammatory changes.  Vascular/Lymphatic: Moderate aortic atherosclerosis. No aneurysmal dilatation. Stable small retroperitoneal lymph nodes. Stable right cardio phrenic lymph nodes. Reproductive: Status post hysterectomy. No adnexal masses. Other: Negative for free air or free fluid. Small fat in the umbilical region Musculoskeletal: No acute or suspicious lesions. IMPRESSION: 1. Small right pleural effusion, decreased compared to most recent CT. Minimal hazy atelectasis or resolving infiltrate in the right lower lobe. 2. Stable bilateral pulmonary nodules including an 11 mm right upper lobe lung nodule, felt benign 3. No CT evidence for acute intra-abdominal or pelvic abnormality. 4. Mild sigmoid colon diverticular disease without acute inflammation. Electronically  Signed   By: Donavan Foil M.D.   On: 06/03/2017 21:46   Dg Chest 2 View  Result Date: 06/03/2017 CLINICAL DATA:  Cough and weakness. Recently hospitalized with pneumonia. EXAM: CHEST  2 VIEW COMPARISON:  Earlier same day; 05/23/2017; chest CT-05/23/2017 FINDINGS: Unchanged cardiac silhouette and mediastinal contours. Slightly reduced lung volumes with associated bibasilar opacities, likely atelectasis. No pleural effusion or pneumothorax. No evidence of edema. There is minimal pleuroparenchymal thickening about the right minor and bilateral major fissures, unchanged. No acute osseus abnormalities. Post lower cervical ACDF. Post cholecystectomy. IMPRESSION: Bibasilar atelectasis without superimposed acute cardiopulmonary disease. Specifically, no discrete focal airspace opacities to suggest pneumonia. Electronically Signed   By: Sandi Mariscal M.D.   On: 06/03/2017 17:11   Dg Chest 2 View  Result Date: 06/03/2017 CLINICAL DATA:  Cough and congestion. Left-sided chest pain patient was recently diagnosed with pneumonia. EXAM: CHEST  2 VIEW COMPARISON:  05/23/2017; 12/24/2016; 05/16/2016; chest CT-05/23/2017 FINDINGS: Grossly unchanged cardiac silhouette and mediastinal  contours with partially calcified bilateral hilar and mediastinal lymph nodes. Overall improved aeration of the lungs with persistent bilateral infrahilar opacities favored to represent atelectasis. Suspected reduction in residual trace right-sided pleural effusion. No definite left-sided pleural effusion. No pneumothorax. There is a minimal amount of pleuroparenchymal thickening about the right minor fissure, unchanged. No evidence of edema. No acute osseous abnormalities. Post lower cervical ACDF, incompletely evaluated. Old fracture involving the posterolateral aspect the left fifth rib. Mild (approximately 25%) compression deformity involving the superior endplates of the E75 and T12 vertebral bodies, unchanged. IMPRESSION: Overall improved aeration of lungs with suspected trace residual right-sided effusion and associated bibasilar opacities, likely atelectasis. Electronically Signed   By: Sandi Mariscal M.D.   On: 06/03/2017 14:50   Ct Chest Wo Contrast  Result Date: 06/03/2017 CLINICAL DATA:  Weakness and cough EXAM: CT CHEST, ABDOMEN AND PELVIS WITHOUT CONTRAST TECHNIQUE: Multidetector CT imaging of the chest, abdomen and pelvis was performed following the standard protocol without IV contrast. COMPARISON:  Radiograph 06/03/2017, CT chest abdomen pelvis 05/23/2017, CT chest 07/11/2015, 01/11/2015, 01/24/2014 FINDINGS: CT CHEST FINDINGS Cardiovascular: Limited evaluation without intravenous contrast. Moderate aortic atherosclerosis. No aneurysmal dilatation. Coronary vessel calcification. Normal heart size. Trace pericardial effusion Mediastinum/Nodes: Midline trachea. No thyroid mass. Mild air distention of the esophagus. Multiple calcified mediastinal and hilar lymph nodes consistent with prior granulomatous disease. Lungs/Pleura: Small right-sided pleural effusion, decreased compared to prior. Hazy atelectasis or resolving infiltrate at the right base. Bilateral pulmonary nodules again visualized. Stable  11 mm right upper lobe lung nodule since 2015, therefore felt benign. No new infiltrate. Musculoskeletal: Degenerative changes. No acute or suspicious lesion. CT ABDOMEN PELVIS FINDINGS Hepatobiliary: No focal liver abnormality is seen. Status post cholecystectomy. No biliary dilatation. Pancreas: Unremarkable. No pancreatic ductal dilatation or surrounding inflammatory changes. Spleen: Normal in size without focal abnormality. Adrenals/Urinary Tract: Adrenal glands are unremarkable. Kidneys are normal, without renal calculi, focal lesion, or hydronephrosis. Bladder is unremarkable. Stomach/Bowel: Stomach is within normal limits. Appendix not well seen but no right lower quadrant inflammatory process. No evidence of bowel wall thickening, distention, or inflammatory changes. Vascular/Lymphatic: Moderate aortic atherosclerosis. No aneurysmal dilatation. Stable small retroperitoneal lymph nodes. Stable right cardio phrenic lymph nodes. Reproductive: Status post hysterectomy. No adnexal masses. Other: Negative for free air or free fluid. Small fat in the umbilical region Musculoskeletal: No acute or suspicious lesions. IMPRESSION: 1. Small right pleural effusion, decreased compared to most recent CT. Minimal hazy atelectasis or resolving infiltrate in the right lower  lobe. 2. Stable bilateral pulmonary nodules including an 11 mm right upper lobe lung nodule, felt benign 3. No CT evidence for acute intra-abdominal or pelvic abnormality. 4. Mild sigmoid colon diverticular disease without acute inflammation. Electronically Signed   By: Donavan Foil M.D.   On: 06/03/2017 21:46    Assessment & Plan:   Problem List Items Addressed This Visit    Hospital discharge follow-up    Patient is stable post discharge and has no new issues or questions about discharge plans at the visit today for hospital follow up. A total of 40 minutes was spent with patient more than half of which was spent in counseling patient on the  above mentioned issues , reviewing and explaining recent labs and imaging studies done, and coordination of care.      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  Lab Results  Component Value Date   MICROALBUR 28.9 (H) 09/26/2015         Relevant Orders   Microalbumin / creatinine urine ratio   Osteoporosis, post-menopausal    Alendronate now C/I due to pill esophagitis.  Evista C/I secondary to known CVD.  Petition for Prolia in process       Pill esophagitis - Primary    Diagnosed during first admission ,  But the most likely offending agents (alendronate and aspirin) were not discontinued during hospitalization and patient was readmitted less than 2 weeks later.  Explanation discussed with patient.  Advised to stop alendronate and aspirin.  PPI prescribed.  GI referral made for EGD    I co not think this patient will react well to viscous lidocaine so I did not re prescribe it.       Relevant Orders   Ambulatory referral to Gastroenterology   Type 2 DM with CKD stage 3 and hypertension ([place])    Previously  well-controlled on current medications.  Patient will submit BS readings before changing her dose of  of 75/25 insulin from 44  units in the morning and 60 units before dinner.  She   is up-to-date on eye exams and foot exam is normal today. Patient is due for urine microalbumin to creatinine ratio at next visit. Patient is tolerating statin therapy for CAD risk reduction and on ACE/ARB for reduction in proteinuria.RTC one month   Lab Results  Component Value Date   HGBA1C 7.5 (H) 06/08/2017   Lab Results  Component Value Date   MICROALBUR 28.9 (H) 09/26/2015          Relevant Orders   Hemoglobin A1c (Completed)   Comprehensive metabolic panel (Completed)    Other Visit Diagnoses    Breast pain       Relevant Orders   MM Digital Diagnostic Bilat   Generalized weakness       Relevant  Orders   Ambulatory referral to [place] am having Cason L. Teall start on pantoprazole. I am also having her maintain her budesonide-formoterol, acetaminophen, docusate sodium, fluticasone, rosuvastatin, polyethylene glycol, glucose blood, ondansetron, metoprolol succinate, PARoxetine, HYDROcodone-acetaminophen, diclofenac sodium, ALPRAZolam, losartan, nitroGLYCERIN, BRILINTA, B-D UF III MINI PEN NEEDLES, isosorbide mononitrate, feeding supplement (GLUCERNA SHAKE), guaiFENesin-dextromethorphan, Insulin Lispro Prot & Lispro, LYRICA, and furosemide.  Meds ordered this encounter  Medications  . pantoprazole (PROTONIX) 40 MG tablet    Sig: Take 1 tablet (40 mg total) by mouth 2 (two) times daily.  Dispense:  60 tablet    Refill:  1    There are no discontinued medications.  Follow-up: No Follow-up on file.   [name], MD

## 2017-06-08 NOTE — Patient Instructions (Addendum)
Your chest pain is coming from irritation of your esophagus from alendronate,  The medicine you take for your bones  This is called "pill esophagitis" .  It is NOT a heart attack STOP TAKING ALENDRONATE.  We will find another medicine to treat your bones  STOP YOUR ASPIRIN FOR THE NEXT 2 WEEKS UNTIL FURTHER NOTICE  I AM PRESCRIBING AN ANTI ACID MEDICATION CALLED PANTOPRAZOLE. TAKE IT TWICE DAILY   TAKE ALL OF YOUR OTHER MEDICATIONS WITH A FULL GLASS OF WATER AND REMAIN SITTING UP FOR AT LEAST 30 MINUTES AFTER YOU TAKE THEM  REFERRAL TO GI FOR ENDOSCOPY IS PLANNED   HOMEHEALTH REFERRAL FOR PT ALSO IN [facility]

## 2017-06-09 ENCOUNTER — Ambulatory Visit (INDEPENDENT_AMBULATORY_CARE_PROVIDER_SITE_OTHER): Payer: Medicare Other | Admitting: Nurse Practitioner

## 2017-06-09 ENCOUNTER — Encounter: Payer: Self-pay | Admitting: Nurse Practitioner

## 2017-06-09 VITALS — BP 116/52 | HR 68 | Ht 67.0 in | Wt 185.8 lb

## 2017-06-09 DIAGNOSIS — I25118 Atherosclerotic heart disease of native coronary artery with other forms of angina pectoris: Secondary | ICD-10-CM

## 2017-06-09 DIAGNOSIS — E785 Hyperlipidemia, unspecified: Secondary | ICD-10-CM | POA: Diagnosis not present

## 2017-06-09 DIAGNOSIS — I5030 Unspecified diastolic (congestive) heart failure: Secondary | ICD-10-CM

## 2017-06-09 DIAGNOSIS — R0789 Other chest pain: Secondary | ICD-10-CM

## 2017-06-09 DIAGNOSIS — I10 Essential (primary) hypertension: Secondary | ICD-10-CM

## 2017-06-09 DIAGNOSIS — I255 Ischemic cardiomyopathy: Secondary | ICD-10-CM

## 2017-06-09 DIAGNOSIS — I209 Angina pectoris, unspecified: Secondary | ICD-10-CM

## 2017-06-09 NOTE — Assessment & Plan Note (Signed)
Previously  well-controlled on current medications.  Patient will submit BS readings before changing her dose of  of 75/25 insulin from 44  units in the morning and 60 units before dinner.  She   is up-to-date on eye exams and foot exam is normal today. Patient is due for urine microalbumin to creatinine ratio at next visit. Patient is tolerating statin therapy for CAD risk reduction and on ACE/ARB for reduction in proteinuria.RTC one month   Lab Results  Component Value Date   HGBA1C 7.5 (H) 06/08/2017   Lab Results  Component Value Date   MICROALBUR 28.9 (H) 09/26/2015

## 2017-06-09 NOTE — Assessment & Plan Note (Signed)
Patient is stable post discharge and has no new issues or questions about discharge plans at the visit today for hospital follow up. A total of 40 minutes was spent with patient more than half of which was spent in counseling patient on the above mentioned issues , reviewing and explaining recent labs and imaging studies done, and coordination of care.

## 2017-06-09 NOTE — Progress Notes (Signed)
Office Visit    Patient [name] Date of Encounter: 06/09/2017  Primary Care Provider:  [name], MD Primary Cardiologist:  Ida Rogue, MD  Chief Complaint    77 year old female with a history of CAD, hypertension, hyperlipidemia, diabetes, stage III chronic kidney disease, pulmonary sarcoidosis, peripheral vascular disease, depression, obstructive sleep apnea, and prior stroke, who presents for follow-up after recent hospitalization in the setting of atypical chest pain.  Past Medical History    Past Medical History:  Diagnosis Date  . Acute respiratory failure ([place]) 11/21/2014  . Anxiety   . Arthritis 08/05/2013  . Atherosclerotic peripheral vascular disease with gangrene [facility]) august 2012  . Cardiomyopathy, ischemic    a. EF 35 to 40% by echo in 2013 b. EF improved to 50-55% by echo in 04/2015; 11/2016 Echo: EF 55-60%, Gr1 DD, mildly dil LA, nl RV fxn.  . Cerebral infarct ([place]) 08/17/2013  . Chronic diastolic CHF (congestive heart failure) (HCC)    a. EF 50-55% by echo in 04/2015; b. 11/2016 Echo: EF 55-60%, Gr1 DD.  [name] COPD (chronic obstructive pulmonary disease) ([place])   . Coronary artery disease, occlusive    a. Previous PCI to the LAD, LCx, and RCA in 2010, 2011, 2013, and 2016. All performed in [place]; b. 11/2016 Lexiscan MV: EF 43%, no ischemia->low risk.  . 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 Diabetic diarrhea ([place]) 10/03/2014  . DM type 2, uncontrolled, with renal complications ([place]) 01/16/6733  . Hepatic steatosis    by CT abd pelvis  . History of kidney stones    at a younger age  . Hyperlipidemia LDL goal <100 02/23/2014  . Hypertensive heart disease   . Osteoporosis, post-menopausal   . 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 Pill esophagitis   . Pleural effusion 10/25/2012   10/2012 CT chest >> small to moderate R lung effusion>> chylothorax, 100% lymphs 10/2013  thoracentesis> cytology negative, WBC 1471, > 90% "small lymphs" 01/2014 CT chest> near complete resolution of pleural effusion, stable lymphadenopathy 08/2014 CT chest [place] ([facility]): small right sided effusion decreased in size, stable mediastinal lymphadenopathy 1.0cm largest, 31m  . Pulmonary sarcoidosis ([place]) 12/07/2012   Diagnosed over 20 years ago in [place] with a mediastinal biopsy 03/2013 Full PFT [facility] > UNACCEPTABLE AND NOT REPRODUCIBLE DATA> Ratio 71% FEV 1 1.02 L (55% pred), FVC 1.31 L (49% pred) could not do lung volumes or DLCO   . Sleep apnea   . Stage III chronic kidney disease [facility])    Past Surgical History:  Procedure Laterality Date  . ABDOMINAL HYSTERECTOMY     at ge 39. secondary to bleeding/partial  . ANTERIOR CERVICAL DECOMP/DISCECTOMY FUSION N/A 10/10/2016   Procedure: Anterior Cervical Discectomy Fusion - Cervical four4- five - Cervical five-Cervical six - Cervical six-Cervical seven;  Surgeon: Earnie Larsson, MD;  Location: [place];  Service: Neurosurgery;  Laterality: N/A;  . BREAST CYST ASPIRATION Right   . CARPAL TUNNEL RELEASE Bilateral   . CHOLECYSTECTOMY     in [place]   . COLONOSCOPY WITH PROPOFOL N/A 01/09/2015   Procedure: COLONOSCOPY WITH PROPOFOL;  Surgeon: Lucilla Lame, MD;  Location: [facility] ENDOSCOPY;  Service: Endoscopy;  Laterality: N/A;  . CORONARY ANGIOPLASTY WITH STENT PLACEMENT     [place]; [facility]  . EYE SURGERY     bil cataracts  .  HERNIA REPAIR     umbilical/ Dr Pat Patrick  . PTCA  August 2012   Right Posterior tibial artery , [place]  . TOE AMPUTATION  Sept 2012   Right 2nd toe, [place]    Allergies  Allergies  Allergen Reactions  . Cephalexin Itching  . Contrast Media [Iodinated Diagnostic Agents] Itching  . Doxycycline Itching  . Gabapentin Itching  . Sulfa Antibiotics Itching    History of Present Illness    77 year old female with the above complex past medical history including  CAD, hypertension, hyperlipidemia, diabetes, stage III chronic kidney disease, pulmonary sarcoidosis, PVD, depression, OSA, and stroke.  She is status post multiple PCI's to the LAD, circumflex, and RCA, all being performed in [place] between 2010 2016.  In July 2018, she was admitted with chest tightness in the setting of participating in physical therapy.  She did have mild troponin elevation to a peak of 0.03.  Echo showed normal LV function and stress testing was low risk.  She was doing well at follow-up visits in July and October 2018.  In early January, she was admitted with central chest discomfort, weakness, and cough.  Troponins were negative and there was suspicion for pill esophagitis.  She was also found to have right basilar pneumonia and was treated with antibiotics and subsequently discharged.  Unfortunately, she was readmitted a week later with ongoing weakness and intermittent left-sided chest discomfort.  ECG was nonacute and troponins were normal despite prolonged symptoms.  Pain was reproducible with palpation over the left chest.  In the setting of atypical symptoms and recent negative Myoview, we opted for medical therapy.  Since her hospitalization, she has continued to have mild left chest/breast tenderness.  She has followed up with primary care and given concern for pill esophagitis, Fosamax and aspirin were held.  PPI was prescribed and she has been referred for EGD.  She also continues to note generalized malaise and weakness, which has been persistent since her initial hospitalization for pneumonia.  She has chronic dyspnea on exertion, which she states is stable.  She denies palpitations, PND, orthopnea, dizziness, syncope, edema, or early satiety.  Home Medications    Prior to Admission medications   Medication Sig Start Date End Date Taking? Authorizing Provider  acetaminophen (TYLENOL) 500 MG tablet Take 1,000 mg by mouth every 6 (six) hours as needed for mild pain or  headache.     [provider]  alendronate (FOSAMAX) 70 MG tablet Take 1 tablet (70 mg total) by mouth once a week. Pt takes on Sunday.   Take with a full glass of water on an empty stomach. 07/01/16   [name], MD  ALPRAZolam [name]) 0.25 MG tablet Take 1 tablet (0.25 mg total) by mouth daily. 11/10/16   [name], MD  aspirin EC 81 MG tablet Take 81 mg by mouth daily.    [provider]  B-D UF III MINI PEN NEEDLES 31G X 5 MM MISC USE THREE TIMES A DAY 03/09/17   [name], MD  BRILINTA 90 MG TABS tablet TAKE 1 TABLET TWICE A DAY 06/08/17   [name], MD  budesonide-formoterol (SYMBICORT) 160-4.5 MCG/ACT inhaler Inhale 2 puffs into the lungs 2 (two) times daily. 02/23/15   Juanito Doom, MD  diclofenac sodium (VOLTAREN) 1 % GEL Apply 2 g topically 4 (four) times daily.    [provider]  docusate sodium (COLACE) 100 MG capsule Take 100 mg by mouth 2 (two)  times daily as needed for mild constipation. Reported on 11/09/2015    [provider]  feeding supplement, GLUCERNA SHAKE, (GLUCERNA SHAKE) LIQD Take 237 mLs by mouth 2 (two) times daily between meals. 05/27/17   Gouru, Illene Silver, MD  fluticasone (FLONASE) 50 MCG/ACT nasal spray Place 2 sprays into both nostrils daily as needed for rhinitis.    [provider]  furosemide (LASIX) 20 MG tablet Take 1 tablet (20 mg total) by mouth every other day. 06/04/17   Fritzi Mandes, MD  glucose blood (ACCU-CHEK AVIVA PLUS) test strip USE TO CHECK GLUCOSE  THREE TIMES DAILY 04/29/16   [name], MD  guaiFENesin-dextromethorphan (ROBITUSSIN DM) 100-10 MG/5ML syrup Take 10 mLs by mouth every 6 (six) hours as needed for cough. 05/26/17   Nicholes Mango, MD  HYDROcodone-acetaminophen (NORCO/VICODIN) 5-325 MG tablet Take 1-2 tablets by mouth every 4 (four) hours as needed (breakthrough pain). 10/13/16   Earnie Larsson, MD  Insulin Lispro Prot & Lispro (HUMALOG MIX 75/25 KWIKPEN) (75-25) 100 UNIT/ML  Kwikpen Inject 30 Units into the skin 2 (two) times daily with a meal. 44 units in the morning and 60 units in the evening 05/26/17   Gouru, Aruna, MD  isosorbide mononitrate (IMDUR) 30 MG 24 hr tablet TAKE 1 TABLET DAILY 04/01/17   [name], MD  losartan (COZAAR) 100 MG tablet TAKE 1 TABLET DAILY 12/10/16   [name], MD  LYRICA 100 MG capsule TAKE 1 CAPSULE BY MOUTH THREE TIMES DAILY 06/05/17   [name], MD  metoprolol succinate (TOPROL-XL) 25 MG 24 hr tablet Take 1 tablet (25 mg total) by mouth daily. 06/18/16   [name], MD  nitroGLYCERIN (NITROLINGUAL) 0.4 MG/SPRAY spray Place 1 spray under the tongue every 5 (five) minutes x 3 doses as needed for chest pain. 01/14/17   [name], MD  ondansetron (ZOFRAN) 4 MG tablet Take 1 tablet (4 mg total) by mouth every 8 (eight) hours as needed for nausea or vomiting. 05/18/16   Rudene Re, MD  pantoprazole (PROTONIX) 40 MG tablet Take 1 tablet (40 mg total) by mouth 2 (two) times daily. 06/08/17   [name], MD  PARoxetine (PAXIL) 20 MG tablet TAKE ONE TABLET BY MOUTH ONCE DAILY 06/25/16   [name], MD  polyethylene glycol (MIRALAX / GLYCOLAX) packet Take 17 g by mouth daily as needed for mild constipation. Reported on 10/25/2015    [provider]  rosuvastatin (CRESTOR) 20 MG tablet Take 20 mg by mouth daily.     [provider]    Review of Systems    Ongoing left chest/breast tenderness.  Continued malaise/weakness.  Chronic, stable dyspnea on exertion.  Also notes shakiness.  She denies palpitations, PND, orthopnea, dizziness, syncope, edema, or early satiety..  All other systems reviewed and are otherwise negative except as noted above.  Physical Exam    VS:  BP (!) 116/52 (BP Location: Left Arm, Patient Position: Sitting, Cuff Size: Normal)   Pulse 68   Ht 5\' 7"  (1.702 m)   Wt 185 lb 12 oz (84.3 kg)   BMI 29.09 kg/m  , BMI Body mass index is 29.09 kg/m. GEN: Well nourished, well  developed, in no acute distress.  HEENT: normal.  Neck: Supple, no JVD, carotid bruits, or masses. Cardiac: RRR, 2/6 systolic ejection murmur at the left upper sternal border, no rubs, or gallops. No clubbing, cyanosis, edema.  Radials/DP/PT 2+ and equal bilaterally.  Left chest wall is  mildly tender to palpation. Respiratory:  Respirations regular and unlabored, clear to auscultation bilaterally. GI: Soft, nontender, nondistended, BS + x 4. MS: no deformity or atrophy. Skin: warm and dry, no rash. Neuro:  Strength and sensation are intact. Psych: Normal affect.  Accessory Clinical Findings    ECG -regular sinus rhythm, 68, first-degree AV block, left axis deviation, right bundle branch block, LVH  Assessment & Plan    1.  Coronary artery disease/atypical chest pain: Status post prior PCI's to the LAD, circumflex, and right coronary artery in [place] between 2010 and 2016.  She had a low risk Myoview in July 2018 in the setting of chest pain that occurred while participating in cardiac rehab.  Recently admitted with chest pain and found to have right lower lobe pneumonia along with strong suspicion for pill esophagitis.  Readmitted January 23 with chest pain and negative enzymes.  Chest pain was reproducible and felt to be musculoskeletal.  She is continued to have mild left chest wall/upper breast tenderness.  She has been placed on PPI therapy and referred to GI for further evaluation of possible pill esophagitis.  From a cardiac standpoint, she has been stable.  She does have stable dyspnea on exertion.  She remains on Brilinta, beta-blocker, nitrate, and statin.  Aspirin is currently on hold pending GI evaluation.  2.  Essential hypertension: Stable on beta-blocker and ARB.  3.  Hyperlipidemia: LDL 60 in July.  Continue statin therapy.  4.  Type 2 diabetes mellitus: Hemoglobin A1c 7.5 in May 2018.  She is followed by primary care and remains on insulin therapy.  5.  HFpEF: Euvolemic  on exam.  Heart rate and blood pressure well controlled.  6.  Obstructive sleep apnea: Compliant with CPAP.  7.  Stage III chronic kidney disease: Recently stable.  Murray Hodgkins, NP 06/09/2017, 10:38 AM

## 2017-06-09 NOTE — Assessment & Plan Note (Signed)
Continue losartan and management of hypertension with goal bp 120/70 met.  Lab Results  Component Value Date   MICROALBUR 28.9 (H) 09/26/2015

## 2017-06-09 NOTE — Patient Instructions (Signed)
Medication Instructions:  Your physician recommends that you continue on your current medications as directed. Please refer to the Current Medication list given to you today.   Labwork: none  Testing/Procedures: none  Follow-Up: Your physician wants you to follow-up in: 4-6 [place]. You will receive a reminder letter in the mail two months in advance. If you don't receive a letter, please call our office to schedule the follow-up appointment.   If you need a refill on your cardiac medications before your next appointment, please call your pharmacy.

## 2017-06-09 NOTE — Assessment & Plan Note (Addendum)
Diagnosed during first admission ,  But the most likely offending agents (alendronate and aspirin) were not discontinued during hospitalization and patient was readmitted less than 2 weeks later.  Explanation discussed with patient.  Advised to stop alendronate and aspirin.  PPI prescribed.  GI referral made for EGD    I co not think this patient will react well to viscous lidocaine so I did not re prescribe it.

## 2017-06-09 NOTE — Assessment & Plan Note (Signed)
Alendronate now C/I due to pill esophagitis.  Evista C/I secondary to known CVD.  Petition for Prolia in process

## 2017-06-10 DIAGNOSIS — N183 Chronic kidney disease, stage 3 (moderate): Secondary | ICD-10-CM | POA: Diagnosis not present

## 2017-06-10 DIAGNOSIS — I129 Hypertensive chronic kidney disease with stage 1 through stage 4 chronic kidney disease, or unspecified chronic kidney disease: Secondary | ICD-10-CM | POA: Diagnosis not present

## 2017-06-10 DIAGNOSIS — I2510 Atherosclerotic heart disease of native coronary artery without angina pectoris: Secondary | ICD-10-CM | POA: Diagnosis not present

## 2017-06-10 DIAGNOSIS — E1122 Type 2 diabetes mellitus with diabetic chronic kidney disease: Secondary | ICD-10-CM | POA: Diagnosis not present

## 2017-06-10 DIAGNOSIS — G250 Essential tremor: Secondary | ICD-10-CM | POA: Diagnosis not present

## 2017-06-10 DIAGNOSIS — M6281 Muscle weakness (generalized): Secondary | ICD-10-CM | POA: Diagnosis not present

## 2017-06-12 ENCOUNTER — Other Ambulatory Visit: Payer: Self-pay | Admitting: Student

## 2017-06-12 ENCOUNTER — Telehealth: Payer: Self-pay | Admitting: Internal Medicine

## 2017-06-12 DIAGNOSIS — R1310 Dysphagia, unspecified: Secondary | ICD-10-CM

## 2017-06-12 NOTE — Telephone Encounter (Signed)
Robin from [facility] called to make sure we follow up  Patient had 2 recent bouts with pneumonia and needs to be seen Scheduled f/u with Dr Juanell Fairly 2/5

## 2017-06-14 ENCOUNTER — Other Ambulatory Visit: Payer: Self-pay | Admitting: Internal Medicine

## 2017-06-15 ENCOUNTER — Other Ambulatory Visit: Payer: Self-pay

## 2017-06-15 MED ORDER — ROSUVASTATIN CALCIUM 20 MG PO TABS: 20.0000 mg | ORAL_TABLET | Freq: Every day | ORAL | 0 refills | Status: DC

## 2017-06-15 NOTE — Progress Notes (Addendum)
* [facility]     Assessment and Plan:  The patient is a 77 year old female with dyspnea, and weakness with debility, deconditioning, sleep apnea, and sarcoidosis.   Obstructive sleep apnea. --She has not been using CPAP regularly, she is having somebody from the homecare company to come out and check the machine due to a sensation of high pressures when she first starts using it.  Addendum 06/17/17:  --Patients machine is broken, therefore she is not able to document compliance, will need to get her a new machine.  06/17/2017    Elevated right diaphragm.  -The patient has decreased air entry in the right lung base, review of her chest x-ray shows an elevated right diaphragm, possibly with a diaphragmatic paralysis. This may be related to her previous history of chylous pleural effusion. 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  Chronic debility and deconditioning.  --I suspect that this is the greatest contributor to her exertional dyspnea.  --Continue to increase activity as tolerated.   Chronic bronchitis.  --Continue using symbicort bid.   Pulmonary sarcoidosis. --Currently appears stable.   Chylous pleural effusion. -Recurrent, due to sarcoidosis, currently stable.  Return in about 6 months (around 12/14/2017).    Date: 06/15/2017  MRN# [id number] BRITTAIN SMITHEY [birth year]   [name] is a 77 y.o. old female seen in follow up for chief complaint of  Chief Complaint  Patient presents with  . Follow-up    cough with clear mucus/thin. She denies chest pain/wheezing.  . Pneumonia    sob with exertion  . Sleep Apnea    pt is having troube with air blowing from mask. Tech is coming in today to check machine out.    Synopsis: The patient has history of pulmonary sarcoidosis diagnosed more than 20 years ago by a mediastinal biopsy. She has a history of thoracic lymphadenopathy, as well as recurrent chylous pleural  effusions in the past. She has a history of sleep apnea, and has been maintained on CPAP.  Subjective: At her last visit it was noted that she had significant deconditioning and she was encouraged to continue PT. It was also noted that she was not using PAP very regularly. She was asked to continue symbicort bid.   She is using Symbicort 2 puffs bid, she is having trouble tolerating CPAP because the pressure feels too high when she first puts it on. She is having someone come out today to check it out.   CXR from 01/08/16; elevated right diaphragm.   **Download data 30 days as of  chest x-ray 09/12/15, bibasilar atelectasis, minimal pleural effusions, scattered interstitial scarring.  10/2012 CT chest >> small to moderate R lung effusion>> chylothorax, 100% lymphs 10/2013 thoracentesis> cytology negative, WBC 1471, > 90% "small lymphs" 01/2014 CT chest> near complete resolution of pleural effusion, stable lymphadenopathy 08/2014 CT chest [place] ([facility]): small right sided effusion decreased in size, stable mediastinal lymphadenopathy 1.0cm largest, 71m  **Desat walk 09/17/16; at rest on RA her sat was 98% and HR 74. After 180 feet sat was 96% and HR 84, slow pace, minimal dyspnea.  After 360 feet; sat was 95% and HR 92; moderate dyspnea.   Medication:   Outpatient Encounter Medications as of 06/16/2017  Medication Sig  . acetaminophen (TYLENOL) 500 MG tablet Take 1,000 mg by mouth every 6 (six) hours as needed for mild pain or headache.   . ALPRAZolam (XANAX) 0.25 MG tablet Take 1  tablet (0.25 mg total) by mouth daily.  . B-D UF III MINI PEN NEEDLES 31G X 5 MM MISC USE THREE TIMES A DAY  . BRILINTA 90 MG TABS tablet Take 1 tablet (90 mg total) by mouth 2 (two) times daily.  . budesonide-formoterol (SYMBICORT) 160-4.5 MCG/ACT inhaler Inhale 2 puffs into the lungs 2 (two) times daily.  . diclofenac sodium (VOLTAREN) 1 % GEL Apply 2 g topically 4 (four) times daily.  [name]  docusate sodium (COLACE) 100 MG capsule Take 100 mg by mouth 2 (two) times daily as needed for mild constipation. Reported on 11/09/2015  . feeding supplement, GLUCERNA SHAKE, (GLUCERNA SHAKE) LIQD Take 237 mLs by mouth 2 (two) times daily between meals.  . fluticasone (FLONASE) 50 MCG/ACT nasal spray Place 2 sprays into both nostrils daily as needed for rhinitis.  . furosemide (LASIX) 20 MG tablet Take 1 tablet (20 mg total) by mouth every other day.  [name] glucose blood (ACCU-CHEK AVIVA PLUS) test strip USE TO CHECK GLUCOSE  THREE TIMES DAILY  . guaiFENesin-dextromethorphan (ROBITUSSIN DM) 100-10 MG/5ML syrup Take 10 mLs by mouth every 6 (six) hours as needed for cough.  [name] HYDROcodone-acetaminophen (NORCO/VICODIN) 5-325 MG tablet Take 1-2 tablets by mouth every 4 (four) hours as needed (breakthrough pain).  . Insulin Lispro Prot & Lispro (HUMALOG MIX 75/25 KWIKPEN) (75-25) 100 UNIT/ML Kwikpen Inject 30 Units into the skin 2 (two) times daily with a meal. 44 units in the morning and 60 units in the evening  . isosorbide mononitrate (IMDUR) 30 MG 24 hr tablet TAKE 1 TABLET DAILY  . losartan (COZAAR) 100 MG tablet TAKE 1 TABLET DAILY  . LYRICA 100 MG capsule TAKE 1 CAPSULE BY MOUTH THREE TIMES DAILY  . metoprolol succinate (TOPROL-XL) 25 MG 24 hr tablet Take 1 tablet (25 mg total) by mouth daily.  . nitroGLYCERIN (NITROLINGUAL) 0.4 MG/SPRAY spray Place 1 spray under the tongue every 5 (five) minutes x 3 doses as needed for chest pain.  [name] ondansetron (ZOFRAN) 4 MG tablet Take 1 tablet (4 mg total) by mouth every 8 (eight) hours as needed for nausea or vomiting.  . pantoprazole (PROTONIX) 40 MG tablet Take 1 tablet (40 mg total) by mouth 2 (two) times daily.  [name] PARoxetine (PAXIL) 20 MG tablet TAKE ONE TABLET BY MOUTH ONCE DAILY  . polyethylene glycol (MIRALAX / GLYCOLAX) packet Take 17 g by mouth daily as needed for mild constipation. Reported on 10/25/2015  . rosuvastatin (CRESTOR) 20 MG tablet Take 1  tablet (20 mg total) by mouth daily.   Facility-Administered Encounter Medications as of 06/16/2017  Medication  . albuterol (PROVENTIL) (5 MG/ML) 0.5% nebulizer solution 2.5 mg     Allergies:  Cephalexin; Contrast media [iodinated diagnostic agents]; Doxycycline; Gabapentin; and Sulfa antibiotics  Review of Systems: Gen:  Denies  fever, sweats. HEENT: Denies blurred vision. Cvc:  No dizziness, chest pain or heaviness Resp:   Denies cough or sputum porduction. Gi: Denies swallowing difficulty, stomach pain. constipation, bowel incontinence Gu:  Denies bladder incontinence, burning urine Ext:   No Joint pain, stiffness. Skin: No skin rash, easy bruising. Endoc:  No polyuria, polydipsia. Psych: No depression, insomnia. Other:  All other systems were reviewed and found to be negative other than what is mentioned in the HPI.   Physical Examination:   VS: BP (!) 120/56 (BP Location: Left Arm, Cuff Size: Normal)   Pulse 83   Resp 16   Ht 5\' 7"  (1.702 m)   Wt 187 lb (  84.8 kg)   SpO2 100%   BMI 29.29 kg/m   General Appearance: No distress  Neuro:without focal findings,  speech normal,  HEENT: PERRLA, EOM intact. Pulmonary: normal breath sounds, No wheezing. Decreased air entry in right lower lobe.    CardiovascularNormal S1,S2.  No m/r/g.   Abdomen: Benign, Soft, non-tender. Renal:  No costovertebral tenderness  GU:  Not performed at this time. Endoc: No evident thyromegaly, no signs of acromegaly. Skin:   warm, no rash. Extremities: normal, no cyanosis, clubbing.   LABORATORY PANEL:   CBC No results for input(s): WBC, HGB, HCT, PLT in the last 168 hours. ------------------------------------------------------------------------------------------------------------------  Chemistries  Recent Labs  Lab 06/08/17 1141  NA 140  K 3.9  CL 104  CO2 29  GLUCOSE 170*  BUN 27*  CREATININE 1.48*  CALCIUM 10.1  AST 15  ALT 9  ALKPHOS 52  BILITOT 0.8    ------------------------------------------------------------------------------------------------------------------  Cardiac Enzymes No results for input(s): TROPONINI in the last 168 hours. ------------------------------------------------------------  RADIOLOGY:   No results found for this or any previous visit. Results for orders placed during the hospital encounter of 09/12/15  DG Chest 2 View   Narrative CLINICAL DATA:  Shortness of breath and chest pain. History of sarcoidosis  EXAM: CHEST  2 VIEW  COMPARISON:  Chest radiograph June 28, 2015 and chest CT July 11, 2015  FINDINGS: There is no edema or consolidation. Heart size and pulmonary vascularity are normal. No adenopathy. Calcified left mediastinal lymph nodes likely is secondary to sarcoidosis. No bone lesions. No pneumothorax.  IMPRESSION: Calcified lymph nodes, a finding likely secondary to prior sarcoidosis. No edema or consolidation.   Electronically Signed   By: Lowella Grip III M.D.   On: 09/12/2015 12:46    ------------------------------------------------------------------------------------------------------------------  Thank  you for allowing [facility] to assist in the care of your patient. Our recommendations are noted above.  Please contact us if we can be of further service.   Marda Stalker, MD.  [facility] Office Number: [phone]  Patricia Pesa, M.D.  Merton Border, M.D  06/15/2017

## 2017-06-16 ENCOUNTER — Ambulatory Visit (INDEPENDENT_AMBULATORY_CARE_PROVIDER_SITE_OTHER): Payer: Medicare Other | Admitting: Internal Medicine

## 2017-06-16 ENCOUNTER — Encounter: Payer: Self-pay | Admitting: Internal Medicine

## 2017-06-16 ENCOUNTER — Telehealth: Payer: Self-pay | Admitting: Internal Medicine

## 2017-06-16 VITALS — BP 120/56 | HR 83 | Resp 16 | Ht 67.0 in | Wt 187.0 lb

## 2017-06-16 DIAGNOSIS — Z9989 Dependence on other enabling machines and devices: Secondary | ICD-10-CM | POA: Diagnosis not present

## 2017-06-16 DIAGNOSIS — R942 Abnormal results of pulmonary function studies: Secondary | ICD-10-CM | POA: Diagnosis not present

## 2017-06-16 DIAGNOSIS — J986 Disorders of diaphragm: Secondary | ICD-10-CM | POA: Diagnosis not present

## 2017-06-16 DIAGNOSIS — I255 Ischemic cardiomyopathy: Secondary | ICD-10-CM

## 2017-06-16 DIAGNOSIS — D860 Sarcoidosis of lung: Secondary | ICD-10-CM | POA: Diagnosis not present

## 2017-06-16 DIAGNOSIS — G4733 Obstructive sleep apnea (adult) (pediatric): Secondary | ICD-10-CM

## 2017-06-16 NOTE — Patient Instructions (Signed)
Continue using CPAP every night.  Continue using symbicort.

## 2017-06-16 NOTE — Telephone Encounter (Signed)
Pt states someone came this morning to check her sleep machine, and advised her she needs to get a new one. She needs a note stating that Dr. Juanell Fairly oks this. Please call to discuss.

## 2017-06-17 DIAGNOSIS — G250 Essential tremor: Secondary | ICD-10-CM | POA: Diagnosis not present

## 2017-06-17 DIAGNOSIS — N183 Chronic kidney disease, stage 3 (moderate): Secondary | ICD-10-CM | POA: Diagnosis not present

## 2017-06-17 DIAGNOSIS — E1122 Type 2 diabetes mellitus with diabetic chronic kidney disease: Secondary | ICD-10-CM | POA: Diagnosis not present

## 2017-06-17 DIAGNOSIS — I129 Hypertensive chronic kidney disease with stage 1 through stage 4 chronic kidney disease, or unspecified chronic kidney disease: Secondary | ICD-10-CM | POA: Diagnosis not present

## 2017-06-17 DIAGNOSIS — M6281 Muscle weakness (generalized): Secondary | ICD-10-CM | POA: Diagnosis not present

## 2017-06-17 DIAGNOSIS — I2510 Atherosclerotic heart disease of native coronary artery without angina pectoris: Secondary | ICD-10-CM | POA: Diagnosis not present

## 2017-06-17 NOTE — Telephone Encounter (Signed)
Patient H H calling  About previous phone note   Please call to discuss   Patient says she needs a new cpap rx

## 2017-06-17 NOTE — Telephone Encounter (Signed)
Please add addendum so that new order can be submitted for a new cpap machine.

## 2017-06-18 ENCOUNTER — Telehealth: Payer: Self-pay | Admitting: Internal Medicine

## 2017-06-18 ENCOUNTER — Encounter: Payer: Self-pay | Admitting: Family Medicine

## 2017-06-18 ENCOUNTER — Ambulatory Visit (INDEPENDENT_AMBULATORY_CARE_PROVIDER_SITE_OTHER): Payer: Medicare Other | Admitting: Family Medicine

## 2017-06-18 VITALS — BP 114/60 | HR 67 | Temp 98.5°F | Wt 185.8 lb

## 2017-06-18 DIAGNOSIS — H6121 Impacted cerumen, right ear: Secondary | ICD-10-CM | POA: Diagnosis not present

## 2017-06-18 DIAGNOSIS — H938X1 Other specified disorders of right ear: Secondary | ICD-10-CM

## 2017-06-18 DIAGNOSIS — I255 Ischemic cardiomyopathy: Secondary | ICD-10-CM

## 2017-06-18 NOTE — Progress Notes (Signed)
Subjective:    Patient ID: Pamela Collier, female    DOB: [birth year], 77 y.o.   MRN: [id number]  HPI  Pamela Collier is a 77 year old female who presents today for ear being "stopped up." This is in her right ear. She reports that when she was hospitalized in January 06/03/17 for pneumonia she was advised to have her "ears checked" as she had "wax" in them. Today, she reports ear fullness which has been present for one week. She denies ear pain or pressure, nasal congestion, sore throat, sinus pressure/pain, decreased hearing, or cough. Associated rhinitis is present. No treatments have been tried at home. Nothing makes right ear fullness better or worse. She states that she feels "much better" since her recent hospitalization. She denies chest pain, palpitations, and SOB today. She is followed by pulmonology for pulmonary sarcoidosis and GI for history of pill esophagitis.  Review of Systems  Constitutional: Negative for chills, fatigue and fever.  HENT: Positive for rhinorrhea. Negative for congestion, ear discharge, ear pain, postnasal drip, sinus pressure, sinus pain, sneezing, sore throat and tinnitus.        Right ear stopped up  Respiratory: Negative for cough, shortness of breath and wheezing.   Cardiovascular: Negative for chest pain and palpitations.  Skin: Negative for rash.  Neurological: Negative for dizziness and light-headedness.   Past Medical History:  Diagnosis Date  . Acute respiratory failure ([place]) 11/21/2014  . Anxiety   . Arthritis 08/05/2013  . Atherosclerotic peripheral vascular disease with gangrene [facility]) august 2012  . Cardiomyopathy, ischemic    a. EF 35 to 40% by echo in 2013 b. EF improved to 50-55% by echo in 04/2015; 11/2016 Echo: EF 55-60%, Gr1 DD, mildly dil LA, nl RV fxn.  . Cerebral infarct ([place]) 08/17/2013  . Chronic diastolic CHF (congestive heart failure) (HCC)    a. EF 50-55% by echo in 04/2015; b. 11/2016 Echo: EF 55-60%, Gr1 DD.  [name] COPD (chronic  obstructive pulmonary disease) ([place])   . Coronary artery disease, occlusive    a. Previous PCI to the LAD, LCx, and RCA in 2010, 2011, 2013, and 2016. All performed in [place]; b. 11/2016 Lexiscan MV: EF 43%, no ischemia->low risk.  . 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 Diabetic diarrhea ([place]) 10/03/2014  . DM type 2, uncontrolled, with renal complications ([place]) 09/16/8500  . Hepatic steatosis    by CT abd pelvis  . History of kidney stones    at a younger age  . Hyperlipidemia LDL goal <100 02/23/2014  . Hypertensive heart disease   . Osteoporosis, post-menopausal   . 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 Pill esophagitis   . Pleural effusion 10/25/2012   10/2012 CT chest >> small to moderate R lung effusion>> chylothorax, 100% lymphs 10/2013 thoracentesis> cytology negative, WBC 1471, > 90% "small lymphs" 01/2014 CT chest> near complete resolution of pleural effusion, stable lymphadenopathy 08/2014 CT chest [place] ([facility]): small right sided effusion decreased in size, stable mediastinal lymphadenopathy 1.0cm largest, 79m  . 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 Sleep apnea   . Stage III chronic kidney disease (  [place])      Social History   Socioeconomic History  . Marital status: Married    Spouse name: Not on file  . Number of children: Not on file  . Years of education: Not on file  . Highest education level: Not on file  Social Needs  . Financial resource strain: Not on file  . Food insecurity - worry: Not on file  . Food insecurity - inability: Not on file  . Transportation needs - medical: Not on file  . Transportation needs - non-medical: Not on  file  Occupational History  . Occupation: retired  Tobacco Use  . Smoking status: Never Smoker  . Smokeless tobacco: Never Used  Substance and Sexual Activity  . Alcohol use: No    Alcohol/week: 0.0 oz  . Drug use: No  . Sexual activity: No  Other Topics Concern  . Not on file  Social History Narrative  . Not on file    Past Surgical History:  Procedure Laterality Date  . ABDOMINAL HYSTERECTOMY     at ge 39. secondary to bleeding/partial  . ANTERIOR CERVICAL DECOMP/DISCECTOMY FUSION N/A 10/10/2016   Procedure: Anterior Cervical Discectomy Fusion - Cervical four4- five - Cervical five-Cervical six - Cervical six-Cervical seven;  Surgeon: Earnie Larsson, MD;  Location: [place];  Service: Neurosurgery;  Laterality: N/A;  . BREAST CYST ASPIRATION Right   . CARPAL TUNNEL RELEASE Bilateral   . CHOLECYSTECTOMY     in [place]   . COLONOSCOPY WITH PROPOFOL N/A 01/09/2015   Procedure: COLONOSCOPY WITH PROPOFOL;  Surgeon: Lucilla Lame, MD;  Location: [facility] ENDOSCOPY;  Service: Endoscopy;  Laterality: N/A;  . CORONARY ANGIOPLASTY WITH STENT PLACEMENT     [place]; [facility]  . EYE SURGERY     bil cataracts  . HERNIA REPAIR     umbilical/ Dr Pat Patrick  . PTCA  August 2012   Right Posterior tibial artery , [place]  . TOE AMPUTATION  Sept 2012   Right 2nd toe, [place]    Family History  Problem Relation Age of Onset  . Heart disease Father   . Heart attack Father   . Heart disease Sister   . Heart attack Sister   . Heart disease Brother   . Heart attack Brother   . Asthma Grandchild   . Breast cancer Maternal Aunt     Allergies  Allergen Reactions  . Cephalexin Itching  . Contrast Media [Iodinated Diagnostic Agents] Itching  . Doxycycline Itching  . Gabapentin Itching  . Sulfa Antibiotics Itching    Current Outpatient Medications on File Prior to Visit  Medication Sig Dispense Refill  . acetaminophen (TYLENOL) 500 MG tablet Take 1,000 mg by mouth every 6 (six)  hours as needed for mild pain or headache.     . ALPRAZolam (XANAX) 0.25 MG tablet Take 1 tablet (0.25 mg total) by mouth daily. 30 tablet 2  . B-D UF III MINI PEN NEEDLES 31G X 5 MM MISC USE THREE TIMES A DAY 270 each 3  . BRILINTA 90 MG TABS tablet Take 1 tablet (90 mg total) by mouth 2 (two) times daily. 180 tablet 0  . budesonide-formoterol (SYMBICORT) 160-4.5 MCG/ACT inhaler Inhale 2 puffs into the lungs 2 (two) times daily. 1 Inhaler 0  . diclofenac sodium (VOLTAREN) 1 % GEL Apply 2 g topically 4 (four) times daily.    [name] docusate sodium (COLACE) 100 MG capsule Take 100 mg by mouth 2 (two) times daily as  needed for mild constipation. Reported on 11/09/2015    . feeding supplement, GLUCERNA SHAKE, (GLUCERNA SHAKE) LIQD Take 237 mLs by mouth 2 (two) times daily between meals. 60 Can 0  . fluticasone (FLONASE) 50 MCG/ACT nasal spray Place 2 sprays into both nostrils daily as needed for rhinitis.    . furosemide (LASIX) 20 MG tablet Take 1 tablet (20 mg total) by mouth every other day. 30 tablet 0  . glucose blood (ACCU-CHEK AVIVA PLUS) test strip USE TO CHECK GLUCOSE  THREE TIMES DAILY 300 each 3  . guaiFENesin-dextromethorphan (ROBITUSSIN DM) 100-10 MG/5ML syrup Take 10 mLs by mouth every 6 (six) hours as needed for cough. 118 mL 0  . HYDROcodone-acetaminophen (NORCO/VICODIN) 5-325 MG tablet Take 1-2 tablets by mouth every 4 (four) hours as needed (breakthrough pain). 30 tablet 0  . Insulin Lispro Prot & Lispro (HUMALOG MIX 75/25 KWIKPEN) (75-25) 100 UNIT/ML Kwikpen Inject 30 Units into the skin 2 (two) times daily with a meal. 44 units in the morning and 60 units in the evening 15 mL 11  . isosorbide mononitrate (IMDUR) 30 MG 24 hr tablet TAKE 1 TABLET DAILY 90 tablet 1  . losartan (COZAAR) 100 MG tablet TAKE 1 TABLET DAILY 90 tablet 1  . LYRICA 100 MG capsule TAKE 1 CAPSULE BY MOUTH THREE TIMES DAILY 90 capsule 5  . metoprolol succinate (TOPROL-XL) 25 MG 24 hr tablet Take 1 tablet (25 mg total)  by mouth daily. 90 tablet 3  . nitroGLYCERIN (NITROLINGUAL) 0.4 MG/SPRAY spray Place 1 spray under the tongue every 5 (five) minutes x 3 doses as needed for chest pain. 12 g 12  . ondansetron (ZOFRAN) 4 MG tablet Take 1 tablet (4 mg total) by mouth every 8 (eight) hours as needed for nausea or vomiting. 20 tablet 0  . pantoprazole (PROTONIX) 40 MG tablet Take 1 tablet (40 mg total) by mouth 2 (two) times daily. 60 tablet 1  . PARoxetine (PAXIL) 20 MG tablet TAKE ONE TABLET BY MOUTH ONCE DAILY 90 tablet 1  . polyethylene glycol (MIRALAX / GLYCOLAX) packet Take 17 g by mouth daily as needed for mild constipation. Reported on 10/25/2015    . rosuvastatin (CRESTOR) 20 MG tablet Take 1 tablet (20 mg total) by mouth daily. 90 tablet 0   Current Facility-Administered Medications on File Prior to Visit  Medication Dose Route Frequency Provider Last Rate Last Dose  . albuterol (PROVENTIL) (5 MG/ML) 0.5% nebulizer solution 2.5 mg  2.5 mg Nebulization Once [name], MD        BP 114/60   Pulse 67   Temp 98.5 F (36.9 C) (Oral)   Wt 185 lb 12.8 oz (84.3 kg)   SpO2 96%   BMI 29.10 kg/m        Objective:   Physical Exam  Constitutional: She is oriented to person, place, and time. She appears well-developed and well-nourished.  NAD  HENT:  Left Ear: Tympanic membrane normal.  Nose: Rhinorrhea present. Right sinus exhibits no maxillary sinus tenderness and no frontal sinus tenderness. Left sinus exhibits no maxillary sinus tenderness and no frontal sinus tenderness.  Mouth/Throat: Oropharynx is clear and moist.  Unable to visualize right TM due to cerumen present. After removal of cerumen through irrigation, right TM visualized and noted as normal. No erythema, bulging present. Light reflex present.  Negative whisper test   Eyes: Pupils are equal, round, and reactive to light. No scleral icterus.  Neck: Neck supple.  Cardiovascular: Normal rate and  regular rhythm.  Pulmonary/Chest:  Effort normal and breath sounds normal. She has no wheezes. She has no rales.  Lymphadenopathy:    She has no cervical adenopathy.  Neurological: She is alert and oriented to person, place, and time.  Skin: Skin is warm and dry. No rash noted.  Psychiatric: She has a normal mood and affect. Her behavior is normal. Judgment and thought content normal.      Assessment & Plan:  1. Ear fullness, right Improved after removal of cerumen. TM normal; negative whisper test.   2. Excessive cerumen in ear canal, right Irrigation of right TM completed without difficulty. No adverse effects noted during procedure noted.    Advised patient to follow up if symptom returns.   Delano Metz, FNP-C

## 2017-06-18 NOTE — Telephone Encounter (Signed)
Pt dropped off papers from her insurance company to be filled out by Dr. Derrel Nip. Papers are up front in Dr. Lupita Dawn colored folder.

## 2017-06-19 ENCOUNTER — Other Ambulatory Visit: Payer: Self-pay | Admitting: Internal Medicine

## 2017-06-19 ENCOUNTER — Telehealth: Payer: Self-pay | Admitting: Internal Medicine

## 2017-06-19 ENCOUNTER — Ambulatory Visit
Admission: RE | Admit: 2017-06-19 | Discharge: 2017-06-19 | Disposition: A | Discharge status: Home / Self Care | Payer: Medicare Other | Source: Ambulatory Visit | Attending: Student | Admitting: Student

## 2017-06-19 DIAGNOSIS — R1310 Dysphagia, unspecified: Secondary | ICD-10-CM | POA: Insufficient documentation

## 2017-06-19 NOTE — Telephone Encounter (Signed)
Please advise 

## 2017-06-19 NOTE — Telephone Encounter (Signed)
FYI verbal order given.

## 2017-06-19 NOTE — Telephone Encounter (Unsigned)
Copied from [place]. Topic: Quick Communication - See Telephone Encounter >> Jun 19, 2017  3:07 PM Boyd Kerbs wrote: CRM for notification. See Telephone encounter for:   [facility] [phone]  pt. Did not want them to come this week.. Wants order to read 0 for this week and resume 2 times a week for 6 week of 2/11  06/19/17.

## 2017-06-22 DIAGNOSIS — Z0279 Encounter for issue of other medical certificate: Secondary | ICD-10-CM

## 2017-06-22 NOTE — Telephone Encounter (Signed)
Placed in red folder  

## 2017-06-23 ENCOUNTER — Telehealth: Payer: Self-pay | Admitting: Internal Medicine

## 2017-06-23 DIAGNOSIS — I129 Hypertensive chronic kidney disease with stage 1 through stage 4 chronic kidney disease, or unspecified chronic kidney disease: Secondary | ICD-10-CM | POA: Diagnosis not present

## 2017-06-23 DIAGNOSIS — N183 Chronic kidney disease, stage 3 (moderate): Secondary | ICD-10-CM | POA: Diagnosis not present

## 2017-06-23 DIAGNOSIS — G250 Essential tremor: Secondary | ICD-10-CM | POA: Diagnosis not present

## 2017-06-23 DIAGNOSIS — I2510 Atherosclerotic heart disease of native coronary artery without angina pectoris: Secondary | ICD-10-CM | POA: Diagnosis not present

## 2017-06-23 DIAGNOSIS — M6281 Muscle weakness (generalized): Secondary | ICD-10-CM | POA: Diagnosis not present

## 2017-06-23 DIAGNOSIS — E1122 Type 2 diabetes mellitus with diabetic chronic kidney disease: Secondary | ICD-10-CM | POA: Diagnosis not present

## 2017-06-23 NOTE — Telephone Encounter (Signed)
The hopitalization form for insurance is complete an dwill be returned to you this afternoon in the red folder .  Please charge $50 for my time in completing it.

## 2017-06-24 DIAGNOSIS — E1042 Type 1 diabetes mellitus with diabetic polyneuropathy: Secondary | ICD-10-CM | POA: Diagnosis not present

## 2017-06-24 DIAGNOSIS — B351 Tinea unguium: Secondary | ICD-10-CM | POA: Diagnosis not present

## 2017-06-24 DIAGNOSIS — L851 Acquired keratosis [keratoderma] palmaris et plantaris: Secondary | ICD-10-CM | POA: Diagnosis not present

## 2017-06-25 DIAGNOSIS — I129 Hypertensive chronic kidney disease with stage 1 through stage 4 chronic kidney disease, or unspecified chronic kidney disease: Secondary | ICD-10-CM | POA: Diagnosis not present

## 2017-06-25 DIAGNOSIS — I2510 Atherosclerotic heart disease of native coronary artery without angina pectoris: Secondary | ICD-10-CM | POA: Diagnosis not present

## 2017-06-25 DIAGNOSIS — E1122 Type 2 diabetes mellitus with diabetic chronic kidney disease: Secondary | ICD-10-CM | POA: Diagnosis not present

## 2017-06-25 DIAGNOSIS — G250 Essential tremor: Secondary | ICD-10-CM | POA: Diagnosis not present

## 2017-06-25 DIAGNOSIS — N183 Chronic kidney disease, stage 3 (moderate): Secondary | ICD-10-CM | POA: Diagnosis not present

## 2017-06-25 DIAGNOSIS — M6281 Muscle weakness (generalized): Secondary | ICD-10-CM | POA: Diagnosis not present

## 2017-06-25 NOTE — Telephone Encounter (Signed)
Pt has been notified that paperwork has been completed and up front to be picked up.

## 2017-06-26 NOTE — Telephone Encounter (Signed)
Pt states she has no way to get to the office, would like you to put the form in the mail. Pt confirmed home address.

## 2017-06-26 NOTE — Telephone Encounter (Signed)
Paperwork has been mailed to pt per pt request.

## 2017-06-29 DIAGNOSIS — M6281 Muscle weakness (generalized): Secondary | ICD-10-CM | POA: Diagnosis not present

## 2017-06-29 DIAGNOSIS — I2510 Atherosclerotic heart disease of native coronary artery without angina pectoris: Secondary | ICD-10-CM | POA: Diagnosis not present

## 2017-06-29 DIAGNOSIS — N183 Chronic kidney disease, stage 3 (moderate): Secondary | ICD-10-CM | POA: Diagnosis not present

## 2017-06-29 DIAGNOSIS — E1122 Type 2 diabetes mellitus with diabetic chronic kidney disease: Secondary | ICD-10-CM | POA: Diagnosis not present

## 2017-06-29 DIAGNOSIS — G250 Essential tremor: Secondary | ICD-10-CM | POA: Diagnosis not present

## 2017-06-29 DIAGNOSIS — I129 Hypertensive chronic kidney disease with stage 1 through stage 4 chronic kidney disease, or unspecified chronic kidney disease: Secondary | ICD-10-CM | POA: Diagnosis not present

## 2017-07-01 DIAGNOSIS — M6281 Muscle weakness (generalized): Secondary | ICD-10-CM | POA: Diagnosis not present

## 2017-07-01 DIAGNOSIS — N183 Chronic kidney disease, stage 3 (moderate): Secondary | ICD-10-CM | POA: Diagnosis not present

## 2017-07-01 DIAGNOSIS — G250 Essential tremor: Secondary | ICD-10-CM | POA: Diagnosis not present

## 2017-07-01 DIAGNOSIS — E1122 Type 2 diabetes mellitus with diabetic chronic kidney disease: Secondary | ICD-10-CM | POA: Diagnosis not present

## 2017-07-01 DIAGNOSIS — I2510 Atherosclerotic heart disease of native coronary artery without angina pectoris: Secondary | ICD-10-CM | POA: Diagnosis not present

## 2017-07-01 DIAGNOSIS — I129 Hypertensive chronic kidney disease with stage 1 through stage 4 chronic kidney disease, or unspecified chronic kidney disease: Secondary | ICD-10-CM | POA: Diagnosis not present

## 2017-07-02 ENCOUNTER — Ambulatory Visit: Payer: Medicare Other

## 2017-07-02 DIAGNOSIS — M6281 Muscle weakness (generalized): Secondary | ICD-10-CM | POA: Diagnosis not present

## 2017-07-02 DIAGNOSIS — G250 Essential tremor: Secondary | ICD-10-CM | POA: Diagnosis not present

## 2017-07-02 DIAGNOSIS — I129 Hypertensive chronic kidney disease with stage 1 through stage 4 chronic kidney disease, or unspecified chronic kidney disease: Secondary | ICD-10-CM | POA: Diagnosis not present

## 2017-07-02 DIAGNOSIS — N183 Chronic kidney disease, stage 3 (moderate): Secondary | ICD-10-CM | POA: Diagnosis not present

## 2017-07-02 DIAGNOSIS — E1122 Type 2 diabetes mellitus with diabetic chronic kidney disease: Secondary | ICD-10-CM | POA: Diagnosis not present

## 2017-07-02 DIAGNOSIS — I2510 Atherosclerotic heart disease of native coronary artery without angina pectoris: Secondary | ICD-10-CM | POA: Diagnosis not present

## 2017-07-06 ENCOUNTER — Telehealth: Payer: Self-pay | Admitting: Internal Medicine

## 2017-07-06 DIAGNOSIS — G250 Essential tremor: Secondary | ICD-10-CM | POA: Diagnosis not present

## 2017-07-06 DIAGNOSIS — M6281 Muscle weakness (generalized): Secondary | ICD-10-CM | POA: Diagnosis not present

## 2017-07-06 DIAGNOSIS — N183 Chronic kidney disease, stage 3 (moderate): Secondary | ICD-10-CM | POA: Diagnosis not present

## 2017-07-06 DIAGNOSIS — I2510 Atherosclerotic heart disease of native coronary artery without angina pectoris: Secondary | ICD-10-CM | POA: Diagnosis not present

## 2017-07-06 DIAGNOSIS — I129 Hypertensive chronic kidney disease with stage 1 through stage 4 chronic kidney disease, or unspecified chronic kidney disease: Secondary | ICD-10-CM | POA: Diagnosis not present

## 2017-07-06 DIAGNOSIS — E1122 Type 2 diabetes mellitus with diabetic chronic kidney disease: Secondary | ICD-10-CM | POA: Diagnosis not present

## 2017-07-06 NOTE — Telephone Encounter (Signed)
2015 titration study printed and given to MD for setting to order new cpap machine. Pt has an appt tomorrow in which this can be taken care of.

## 2017-07-06 NOTE — Telephone Encounter (Signed)
Angel from [facility] stating pt CPAP machine isn't working. They one patient has it about 76 years old They would like a new order placed in with DME  [facility] through [facility].  Pt  has been without CPAP machine for a while.   Please advise

## 2017-07-06 NOTE — Progress Notes (Addendum)
* [facility]     Assessment and Plan:  The patient is a 77 year old female with dyspnea, and weakness with debility, deconditioning, sleep apnea, and sarcoidosis.   Obstructive sleep apnea. --CPAP machine is broken, will send in prescription for new auto-cpap machine with settings 5-15 cm H2O,  Chylous pleural effusion. -Recurrent, due to sarcoidosis, now with decreased air entry at right base suggestive of recurrence, the patient appears to be asymptomatic. - We will send for repeat chest x-ray today and again in 3 months.  Elevated right diaphragm.  -The patient has decreased air entry in the right lung base, review of her chest x-ray shows an elevated right diaphragm, possibly with a diaphragmatic paralysis. This may be related to her previous history of chylous pleural effusion. 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  Chronic debility and deconditioning.  --I suspect that this is the greatest contributor to her exertional dyspnea.  --Continue to increase activity as tolerated.   Chronic bronchitis.  --Continue using symbicort bid.   Pulmonary sarcoidosis. --Currently appears stable.   Addendum 07/13/17: -Repeat chest x-ray imaging personally reviewed, no significant changes noted.  Orders Placed This Encounter  Procedures  . DG Chest 2 View  . DG Chest 2 View     Return in about 3 months (around 10/04/2017).    Date: 07/06/2017  MRN# [id number] Pamela Collier 04/03/1941   Pamela Collier is a 77 y.o. old female seen in follow up for chief complaint of  Chief Complaint  Patient presents with  . Shortness of Breath    pt c/o cough with clear mucus/ sob with exertion. She is recovering from pneumo x 1.5 mos ago.  . Sleep Apnea    pt is currently not on cpap therapy.    Synopsis: The patient has history of pulmonary sarcoidosis diagnosed more than 20 years ago by a mediastinal biopsy. She has a history of  thoracic lymphadenopathy, as well as recurrent chylous pleural effusions in the past. She has a history of sleep apnea, and has been maintained on CPAP.  Subjective: At her last visit it was noted that she had significant deconditioning and she was encouraged to continue PT. It was also noted that she was not using PAP very regularly. She was asked to continue symbicort bid.  Since her last visit her CPAP broke, and a new one was ordered. Today she notes that she is her usual state, she had a home care nurse visit her and thought that there may be fluid on her lungs.   She is using Symbicort 2 puffs bid, she is waiting for her new CPAP machine.   CXR from 01/08/16; elevated right diaphragm.   **Download data 30 days as of  chest x-ray 09/12/15, bibasilar atelectasis, minimal pleural effusions, scattered interstitial scarring.  10/2012 CT chest >> small to moderate R lung effusion>> chylothorax, 100% lymphs 10/2013 thoracentesis> cytology negative, WBC 1471, > 90% "small lymphs" 01/2014 CT chest> near complete resolution of pleural effusion, stable lymphadenopathy 08/2014 CT chest [place] ([facility]): small right sided effusion decreased in size, stable mediastinal lymphadenopathy 1.0cm largest, 3m  **Desat walk 09/17/16; at rest on RA her sat was 98% and HR 74. After 180 feet sat was 96% and HR 84, slow pace, minimal dyspnea.  After 360 feet; sat was 95% and HR 92; moderate dyspnea.   Medication:   Outpatient Encounter Medications as of 07/07/2017  Medication Sig  . acetaminophen (  TYLENOL) 500 MG tablet Take 1,000 mg by mouth every 6 (six) hours as needed for mild pain or headache.   . ALPRAZolam (XANAX) 0.25 MG tablet Take 1 tablet (0.25 mg total) by mouth daily.  . B-D UF III MINI PEN NEEDLES 31G X 5 MM MISC USE THREE TIMES A DAY  . BRILINTA 90 MG TABS tablet Take 1 tablet (90 mg total) by mouth 2 (two) times daily.  . budesonide-formoterol (SYMBICORT) 160-4.5 MCG/ACT  inhaler Inhale 2 puffs into the lungs 2 (two) times daily.  . diclofenac sodium (VOLTAREN) 1 % GEL Apply 2 g topically 4 (four) times daily.  [name] docusate sodium (COLACE) 100 MG capsule Take 100 mg by mouth 2 (two) times daily as needed for mild constipation. Reported on 11/09/2015  . feeding supplement, GLUCERNA SHAKE, (GLUCERNA SHAKE) LIQD Take 237 mLs by mouth 2 (two) times daily between meals.  . fluticasone (FLONASE) 50 MCG/ACT nasal spray Place 2 sprays into both nostrils daily as needed for rhinitis.  . furosemide (LASIX) 20 MG tablet Take 1 tablet (20 mg total) by mouth every other day.  [name] glucose blood (ACCU-CHEK AVIVA PLUS) test strip USE TO CHECK GLUCOSE  THREE TIMES DAILY  . guaiFENesin-dextromethorphan (ROBITUSSIN DM) 100-10 MG/5ML syrup Take 10 mLs by mouth every 6 (six) hours as needed for cough.  [name] HYDROcodone-acetaminophen (NORCO/VICODIN) 5-325 MG tablet Take 1-2 tablets by mouth every 4 (four) hours as needed (breakthrough pain).  . Insulin Lispro Prot & Lispro (HUMALOG MIX 75/25 KWIKPEN) (75-25) 100 UNIT/ML Kwikpen Inject 30 Units into the skin 2 (two) times daily with a meal. 44 units in the morning and 60 units in the evening  . isosorbide mononitrate (IMDUR) 30 MG 24 hr tablet TAKE 1 TABLET DAILY  . losartan (COZAAR) 100 MG tablet TAKE 1 TABLET DAILY  . LYRICA 100 MG capsule TAKE 1 CAPSULE BY MOUTH THREE TIMES DAILY  . metoprolol succinate (TOPROL-XL) 25 MG 24 hr tablet Take 1 tablet (25 mg total) by mouth daily.  . nitroGLYCERIN (NITROLINGUAL) 0.4 MG/SPRAY spray Place 1 spray under the tongue every 5 (five) minutes x 3 doses as needed for chest pain.  [name] ondansetron (ZOFRAN) 4 MG tablet Take 1 tablet (4 mg total) by mouth every 8 (eight) hours as needed for nausea or vomiting.  . pantoprazole (PROTONIX) 40 MG tablet Take 1 tablet (40 mg total) by mouth 2 (two) times daily.  [name] PARoxetine (PAXIL) 20 MG tablet TAKE ONE TABLET BY MOUTH ONCE DAILY  . polyethylene glycol (MIRALAX /  GLYCOLAX) packet Take 17 g by mouth daily as needed for mild constipation. Reported on 10/25/2015  . rosuvastatin (CRESTOR) 20 MG tablet Take 1 tablet (20 mg total) by mouth daily.  . rosuvastatin (CRESTOR) 20 MG tablet TAKE ONE TABLET BY MOUTH ONCE DAILY   Facility-Administered Encounter Medications as of 07/07/2017  Medication  . albuterol (PROVENTIL) (5 MG/ML) 0.5% nebulizer solution 2.5 mg     Allergies:  Cephalexin; Contrast media [iodinated diagnostic agents]; Doxycycline; Gabapentin; and Sulfa antibiotics  Review of Systems: Gen:  Denies  fever, sweats. HEENT: Denies blurred vision. Cvc:  No dizziness, chest pain or heaviness Resp:   Denies cough or sputum porduction. Gi: Denies swallowing difficulty, stomach pain. constipation, bowel incontinence Gu:  Denies bladder incontinence, burning urine Ext:   No Joint pain, stiffness. Skin: No skin rash, easy bruising. Endoc:  No polyuria, polydipsia. Psych: No depression, insomnia. Other:  All other systems were reviewed and found to be negative other  than what is mentioned in the HPI.   Physical Examination:   VS: There were no vitals taken for this visit.  General Appearance: No distress  Neuro:without focal findings,  speech normal,  HEENT: PERRLA, EOM intact. Pulmonary: normal breath sounds, No wheezing. Decreased air entry in right lower lobe.    CardiovascularNormal S1,S2.  No m/r/g.   Abdomen: Benign, Soft, non-tender. Renal:  No costovertebral tenderness  GU:  Not performed at this time. Endoc: No evident thyromegaly, no signs of acromegaly. Skin:   warm, no rash. Extremities: normal, no cyanosis, clubbing.   LABORATORY PANEL:   CBC No results for input(s): WBC, HGB, HCT, PLT in the last 168 hours. ------------------------------------------------------------------------------------------------------------------  Chemistries  No results for input(s): NA, K, CL, CO2, GLUCOSE, BUN, CREATININE, CALCIUM, MG, AST, ALT,  ALKPHOS, BILITOT in the last 168 hours.  Invalid input(s): GFRCGP ------------------------------------------------------------------------------------------------------------------  Cardiac Enzymes No results for input(s): TROPONINI in the last 168 hours. ------------------------------------------------------------  RADIOLOGY:   No results found for this or any previous visit. Results for orders placed during the hospital encounter of 09/12/15  DG Chest 2 View   Narrative CLINICAL DATA:  Shortness of breath and chest pain. History of sarcoidosis  EXAM: CHEST  2 VIEW  COMPARISON:  Chest radiograph June 28, 2015 and chest CT July 11, 2015  FINDINGS: There is no edema or consolidation. Heart size and pulmonary vascularity are normal. No adenopathy. Calcified left mediastinal lymph nodes likely is secondary to sarcoidosis. No bone lesions. No pneumothorax.  IMPRESSION: Calcified lymph nodes, a finding likely secondary to prior sarcoidosis. No edema or consolidation.   Electronically Signed   By: Lowella Grip III M.D.   On: 09/12/2015 12:46    ------------------------------------------------------------------------------------------------------------------  Thank  you for allowing [facility] to assist in the care of your patient. Our recommendations are noted above.  Please contact us if we can be of further service.   Marda Stalker, MD.  [facility] Office Number: [phone]  Patricia Pesa, M.D.  Merton Border, M.D  07/06/2017

## 2017-07-07 ENCOUNTER — Ambulatory Visit
Admission: RE | Admit: 2017-07-07 | Discharge: 2017-07-07 | Disposition: A | Discharge status: Home / Self Care | Payer: Medicare Other | Source: Ambulatory Visit | Attending: Internal Medicine | Admitting: Internal Medicine

## 2017-07-07 ENCOUNTER — Ambulatory Visit (INDEPENDENT_AMBULATORY_CARE_PROVIDER_SITE_OTHER): Payer: Medicare Other | Admitting: Internal Medicine

## 2017-07-07 ENCOUNTER — Encounter: Payer: Self-pay | Admitting: Internal Medicine

## 2017-07-07 VITALS — BP 150/60 | HR 63 | Resp 16 | Ht 67.0 in | Wt 186.0 lb

## 2017-07-07 DIAGNOSIS — I700 Atherosclerosis of aorta: Secondary | ICD-10-CM | POA: Insufficient documentation

## 2017-07-07 DIAGNOSIS — D869 Sarcoidosis, unspecified: Secondary | ICD-10-CM | POA: Insufficient documentation

## 2017-07-07 DIAGNOSIS — G4733 Obstructive sleep apnea (adult) (pediatric): Secondary | ICD-10-CM | POA: Diagnosis not present

## 2017-07-07 DIAGNOSIS — J90 Pleural effusion, not elsewhere classified: Secondary | ICD-10-CM | POA: Diagnosis not present

## 2017-07-07 DIAGNOSIS — I255 Ischemic cardiomyopathy: Secondary | ICD-10-CM

## 2017-07-07 DIAGNOSIS — D860 Sarcoidosis of lung: Secondary | ICD-10-CM | POA: Diagnosis not present

## 2017-07-07 DIAGNOSIS — R05 Cough: Secondary | ICD-10-CM | POA: Diagnosis not present

## 2017-07-07 NOTE — Patient Instructions (Signed)
Will send for Chest xray.  Will send orders to be started on a new cpap.

## 2017-07-07 NOTE — Addendum Note (Signed)
Addended by: Stephanie Coup on: 07/07/2017 12:17 PM   Modules accepted: Orders

## 2017-07-08 DIAGNOSIS — M6281 Muscle weakness (generalized): Secondary | ICD-10-CM | POA: Diagnosis not present

## 2017-07-08 DIAGNOSIS — G250 Essential tremor: Secondary | ICD-10-CM | POA: Diagnosis not present

## 2017-07-08 DIAGNOSIS — N183 Chronic kidney disease, stage 3 (moderate): Secondary | ICD-10-CM | POA: Diagnosis not present

## 2017-07-08 DIAGNOSIS — E1122 Type 2 diabetes mellitus with diabetic chronic kidney disease: Secondary | ICD-10-CM | POA: Diagnosis not present

## 2017-07-08 DIAGNOSIS — I2510 Atherosclerotic heart disease of native coronary artery without angina pectoris: Secondary | ICD-10-CM | POA: Diagnosis not present

## 2017-07-08 DIAGNOSIS — I129 Hypertensive chronic kidney disease with stage 1 through stage 4 chronic kidney disease, or unspecified chronic kidney disease: Secondary | ICD-10-CM | POA: Diagnosis not present

## 2017-07-10 ENCOUNTER — Telehealth: Payer: Self-pay | Admitting: Internal Medicine

## 2017-07-10 NOTE — Telephone Encounter (Signed)
fyi

## 2017-07-10 NOTE — Telephone Encounter (Signed)
Copied from [place]. Topic: Quick Communication - See Telephone Encounter >> Jul 10, 2017 12:09 PM Boyd Kerbs wrote: CRM for notification. See Telephone encounter for:   Pamela Collier [phone] [facility]  She had a scheduled PT appt. For today.  She is not feeling well and he will be putting this down as a missed visit.  He did go to her house and tried calling her, but no one answered.    07/10/17.

## 2017-07-10 NOTE — Telephone Encounter (Signed)
Patient wants cxray results   Please call

## 2017-07-10 NOTE — Telephone Encounter (Signed)
COURTESY CALL TO PT  Please ensure she is okay.   Thanks [employer]

## 2017-07-10 NOTE — Telephone Encounter (Signed)
Pt wants here CXR results.

## 2017-07-10 NOTE — Telephone Encounter (Signed)
Pamela Collier spoke with pt earlier today and pt is scheduled with Joycelyn Schmid on Monday.

## 2017-07-10 NOTE — Telephone Encounter (Signed)
noted 

## 2017-07-13 ENCOUNTER — Ambulatory Visit (INDEPENDENT_AMBULATORY_CARE_PROVIDER_SITE_OTHER): Payer: Medicare Other | Admitting: Family

## 2017-07-13 ENCOUNTER — Encounter: Payer: Self-pay | Admitting: Family

## 2017-07-13 ENCOUNTER — Telehealth: Payer: Self-pay | Admitting: Pharmacist

## 2017-07-13 DIAGNOSIS — I208 Other forms of angina pectoris: Secondary | ICD-10-CM | POA: Diagnosis not present

## 2017-07-13 DIAGNOSIS — N183 Chronic kidney disease, stage 3 (moderate): Secondary | ICD-10-CM | POA: Diagnosis not present

## 2017-07-13 DIAGNOSIS — R05 Cough: Secondary | ICD-10-CM | POA: Diagnosis not present

## 2017-07-13 DIAGNOSIS — E1122 Type 2 diabetes mellitus with diabetic chronic kidney disease: Secondary | ICD-10-CM | POA: Diagnosis not present

## 2017-07-13 DIAGNOSIS — R059 Cough, unspecified: Secondary | ICD-10-CM

## 2017-07-13 DIAGNOSIS — I129 Hypertensive chronic kidney disease with stage 1 through stage 4 chronic kidney disease, or unspecified chronic kidney disease: Secondary | ICD-10-CM | POA: Diagnosis not present

## 2017-07-13 DIAGNOSIS — I255 Ischemic cardiomyopathy: Secondary | ICD-10-CM

## 2017-07-13 NOTE — Assessment & Plan Note (Signed)
Well appearing today. No acute distress. Reassured as patient course has improved. Recent CXR does not show PNA. Patient will continue to monitor symptoms and let me know if she doesn't continue to improve.

## 2017-07-13 NOTE — Patient Instructions (Addendum)
In morning, take blood sugar when first get up BEFORE eating. Goal is less than 120.   At night, take blood sugar 2 hours after dinner, goal is less than 160.   Please be sure to call physical therapist, Suezanne Jacquet,  this week since you are feeling better.   I am so glad you are feeling better.   If you have chest pain please take your nitro as you do and ensure you call 911.    Heart Attack A heart attack (myocardial infarction, MI) causes damage to the heart that cannot be fixed. A heart attack often happens when a blood clot or other blockage cuts blood flow to the heart. When this happens, certain areas of the heart begin to die. This causes the pain you feel during a heart attack. Follow these instructions at home:  Take medicine as told by your doctor. You may need medicine to: ? Keep your blood from clotting too easily. ? Control your blood pressure. ? Lower your cholesterol. ? Control abnormal heart rhythms.  Change certain behaviors as told by your doctor. This may include: ? Quitting smoking. ? Being active. ? Eating a heart-healthy diet. Ask your doctor for help with this diet. ? Keeping a healthy weight. ? Keeping your diabetes under control. ? Lessening stress. ? Limiting how much alcohol you drink. Do not take these medicines unless your doctor says that you can:  Nonsteroidal anti-inflammatory drugs (NSAIDs). These include: ? Ibuprofen. ? Naproxen. ? Celecoxib.  Vitamin supplements that have vitamin A, vitamin E, or both.  Hormone therapy that contains estrogen with or without progestin.  Get help right away if:  You have sudden chest discomfort.  You have sudden discomfort in your: ? Arms. ? Back. ? Neck. ? Jaw.  You have shortness of breath at any time.  You have sudden sweating or clammy skin.  You feel sick to your stomach (nauseous) or throw up (vomit).  You suddenly get light-headed or dizzy.  You feel your heart beating fast or skipping  beats. These symptoms may be an emergency. Do not wait to see if the symptoms will go away. Get medical help right away. Call your local emergency services (911 in the [place] Do not drive yourself to the hospital. This information is not intended to replace advice given to you by your health care provider. Make sure you discuss any questions you have with your health care provider. Document Released: 10/28/2011 Document Revised: 10/04/2015 Document Reviewed: 07/01/2013 Elsevier Interactive Patient Education  2017 [employer].

## 2017-07-13 NOTE — Telephone Encounter (Signed)
No significant changes from previous, there is no fluid collection.

## 2017-07-13 NOTE — Telephone Encounter (Signed)
Patient scheduled in pharmacy clinic for 08/03/2017 at [facility] her to bring all medications and glucometer. Patient verbalizes understanding.   [name], Pharm.D., BCPS PGY2 Ambulatory Care Pharmacy Resident Phone: [phone]

## 2017-07-13 NOTE — Telephone Encounter (Signed)
Pt informed of results of CXR. Nothing further needed.

## 2017-07-13 NOTE — Progress Notes (Signed)
Subjective:    Patient ID: Pamela Collier, female    DOB: [birth year], 77 y.o.   MRN: [id number]  CC: Pamela Collier is a 77 y.o. female who presents today for an acute visit.    HPI: Complains of cough episodes twice per week, for past 2 months, improved today. 'not as bas as it was before.' Prior had sputum however no more sputum.   No cough in 2-3 days days. No fever, sinus pain, ear pain.   Using symbicort BID, flonase with relief.   Spoke with triage about diarrhea last week,  which has since resolved.   Notes having to use nitro one week ago, chest pain resolved at this time. In total has used nitro 2-3 times per month in 2019 which is typical for her. Follows with cardiology, Sharolyn Douglas.   DM- brings blood sugar log.  No hypoglycemic episodes. Doing better since being hospitalized earlier this year. Has elevated numbers as likes to eat 'the good stuff' on the weekend.   Missed PT last week. States will call  PT to reschedule as feels like she can go to PT this week.   Back on ASA , no longer on alendronate. Thinks protonix may be helping but not sure which pill it is.       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 Repeat CXR in 3 months,  Elevated right diaprham  CAD s/p PCIs- Gerald Stabs 05/2017; notes low risk Myoview in July 2018 . Recently admitted with CP, 06/03/17 in which AMI was ruled out. Conitnues to have substernal chest pain. Advised to stop alendronate and asa, PPI started 1/28 by PCP.   CXR 07/07/17 Chronic bronchitis; no pna, chf pleural effusion.   HISTORY:  Past Medical History:  Diagnosis Date  . Acute respiratory failure ([place]) 11/21/2014  . Anxiety   . Arthritis 08/05/2013  . Atherosclerotic peripheral vascular disease with gangrene [facility]) august 2012  . Cardiomyopathy, ischemic    a. EF 35 to 40% by echo in 2013 b. EF improved to 50-55% by echo in 04/2015; 11/2016 Echo: EF 55-60%, Gr1 DD, mildly  dil LA, nl RV fxn.  . Cerebral infarct ([place]) 08/17/2013  . Chronic diastolic CHF (congestive heart failure) (HCC)    a. EF 50-55% by echo in 04/2015; b. 11/2016 Echo: EF 55-60%, Gr1 DD.  [name] COPD (chronic obstructive pulmonary disease) ([place])   . Coronary artery disease, occlusive    a. Previous PCI to the LAD, LCx, and RCA in 2010, 2011, 2013, and 2016. All performed in [place]; b. 11/2016 Lexiscan MV: EF 43%, no ischemia->low risk.  . 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 Diabetic diarrhea ([place]) 10/03/2014  . DM type 2, uncontrolled, with renal complications ([employer]) 08/16/5679  . Hepatic steatosis    by CT abd pelvis  . History of kidney stones    at a younger age  . Hyperlipidemia LDL goal <100 02/23/2014  . Hypertensive heart disease   . Osteoporosis, post-menopausal   . 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 Pill esophagitis   . Pleural effusion 10/25/2012   10/2012 CT chest >> small to moderate R lung effusion>> chylothorax, 100% lymphs 10/2013 thoracentesis> cytology negative, WBC 1471, > 90% "small lymphs" 01/2014 CT chest> near complete resolution of pleural effusion, stable lymphadenopathy 08/2014 CT chest [place] ([facility]): small right sided effusion  decreased in size, stable mediastinal lymphadenopathy 1.0cm largest, 64m  . 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 Sleep apnea   . Stage III chronic kidney disease [facility])    Past Surgical History:  Procedure Laterality Date  . ABDOMINAL HYSTERECTOMY     at ge 39. secondary to bleeding/partial  . ANTERIOR CERVICAL DECOMP/DISCECTOMY FUSION N/A 10/10/2016   Procedure: Anterior Cervical Discectomy Fusion - Cervical  four4- five - Cervical five-Cervical six - Cervical six-Cervical seven;  Surgeon: Earnie Larsson, MD;  Location: [facility];  Service: Neurosurgery;  Laterality: N/A;  . BREAST CYST ASPIRATION Right   . CARPAL TUNNEL RELEASE Bilateral   . CHOLECYSTECTOMY     in [place]   . COLONOSCOPY WITH PROPOFOL N/A 01/09/2015   Procedure: COLONOSCOPY WITH PROPOFOL;  Surgeon: Lucilla Lame, MD;  Location: [facility] ENDOSCOPY;  Service: Endoscopy;  Laterality: N/A;  . CORONARY ANGIOPLASTY WITH STENT PLACEMENT     [place]; [facility]  . EYE SURGERY     bil cataracts  . HERNIA REPAIR     umbilical/ Dr Pat Patrick  . PTCA  August 2012   Right Posterior tibial artery , [place]  . TOE AMPUTATION  Sept 2012   Right 2nd toe, [place]   Family History  Problem Relation Age of Onset  . Heart disease Father   . Heart attack Father   . Heart disease Sister   . Heart attack Sister   . Heart disease Brother   . Heart attack Brother   . Asthma Grandchild   . Breast cancer Maternal Aunt     Allergies: Cephalexin; Contrast media [iodinated diagnostic agents]; Doxycycline; Gabapentin; and Sulfa antibiotics Current Outpatient Medications on File Prior to Visit  Medication Sig Dispense Refill  . acetaminophen (TYLENOL) 500 MG tablet Take 1,000 mg by mouth every 6 (six) hours as needed for mild pain or headache.     . ALPRAZolam (XANAX) 0.25 MG tablet Take 1 tablet (0.25 mg total) by mouth daily. 30 tablet 2  . B-D UF III MINI PEN NEEDLES 31G X 5 MM MISC USE THREE TIMES A DAY 270 each 3  . BRILINTA 90 MG TABS tablet Take 1 tablet (90 mg total) by mouth 2 (two) times daily. 180 tablet 0  . budesonide-formoterol (SYMBICORT) 160-4.5 MCG/ACT inhaler Inhale 2 puffs into the lungs 2 (two) times daily. 1 Inhaler 0  . diclofenac sodium (VOLTAREN) 1 % GEL Apply 2 g topically 4 (four) times daily.    [name] docusate sodium (COLACE) 100 MG capsule Take 100 mg by mouth 2 (two) times daily as needed for mild constipation. Reported  on 11/09/2015    . feeding supplement, GLUCERNA SHAKE, (GLUCERNA SHAKE) LIQD Take 237 mLs by mouth 2 (two) times daily between meals. 60 Can 0  . fluticasone (FLONASE) 50 MCG/ACT nasal spray Place 2 sprays into both nostrils daily as needed for rhinitis.    . furosemide (LASIX) 20 MG tablet Take 1 tablet (20 mg total) by mouth every other day. 30 tablet 0  . glucose blood (ACCU-CHEK AVIVA PLUS) test strip USE TO CHECK GLUCOSE  THREE TIMES DAILY 300 each 3  . Insulin Lispro Prot & Lispro (HUMALOG MIX 75/25 KWIKPEN) (75-25) 100 UNIT/ML Kwikpen Inject  30 Units into the skin 2 (two) times daily with a meal. 44 units in the morning and 60 units in the evening 15 mL 11  . isosorbide mononitrate (IMDUR) 30 MG 24 hr tablet TAKE 1 TABLET DAILY 90 tablet 1  . losartan (COZAAR) 100 MG tablet TAKE 1 TABLET DAILY 90 tablet 1  . LYRICA 100 MG capsule TAKE 1 CAPSULE BY MOUTH THREE TIMES DAILY 90 capsule 5  . metoprolol succinate (TOPROL-XL) 25 MG 24 hr tablet Take 1 tablet (25 mg total) by mouth daily. 90 tablet 3  . nitroGLYCERIN (NITROLINGUAL) 0.4 MG/SPRAY spray Place 1 spray under the tongue every 5 (five) minutes x 3 doses as needed for chest pain. 12 g 12  . ondansetron (ZOFRAN) 4 MG tablet Take 1 tablet (4 mg total) by mouth every 8 (eight) hours as needed for nausea or vomiting. 20 tablet 0  . pantoprazole (PROTONIX) 40 MG tablet Take 1 tablet (40 mg total) by mouth 2 (two) times daily. 60 tablet 1  . PARoxetine (PAXIL) 20 MG tablet TAKE ONE TABLET BY MOUTH ONCE DAILY 90 tablet 1  . polyethylene glycol (MIRALAX / GLYCOLAX) packet Take 17 g by mouth daily as needed for mild constipation. Reported on 10/25/2015    . rosuvastatin (CRESTOR) 20 MG tablet Take 1 tablet (20 mg total) by mouth daily. 90 tablet 0  . rosuvastatin (CRESTOR) 20 MG tablet TAKE ONE TABLET BY MOUTH ONCE DAILY 90 tablet 1   Current Facility-Administered Medications on File Prior to Visit  Medication Dose Route Frequency Provider Last Rate  Last Dose  . albuterol (PROVENTIL) (5 MG/ML) 0.5% nebulizer solution 2.5 mg  2.5 mg Nebulization Once [name], MD        Social History   Tobacco Use  . Smoking status: Never Smoker  . Smokeless tobacco: Never Used  Substance Use Topics  . Alcohol use: No    Alcohol/week: 0.0 oz  . Drug use: No    Review of Systems  Constitutional: Negative for chills and fever.  HENT: Positive for congestion. Negative for sinus pressure and sinus pain.   Respiratory: Positive for cough. Negative for shortness of breath and wheezing.   Cardiovascular: Positive for chest pain (resolved). Negative for palpitations.  Gastrointestinal: Negative for diarrhea (resolved), nausea and vomiting.      Objective:    BP (!) 142/58 (BP Location: Left Arm, Patient Position: Sitting, Cuff Size: Normal)   Pulse 64   Temp 99.1 F (37.3 C) (Oral)   Resp 16   Wt 185 lb 8 oz (84.1 kg)   SpO2 95%   BMI 29.05 kg/m    Physical Exam  Constitutional: She appears well-developed and well-nourished.  HENT:  Head: Normocephalic and atraumatic.  Right Ear: Hearing, tympanic membrane, external ear and ear canal normal. No drainage, swelling or tenderness. No foreign bodies. Tympanic membrane is not erythematous and not bulging. No middle ear effusion. No decreased hearing is noted.  Left Ear: Hearing, tympanic membrane, external ear and ear canal normal. No drainage, swelling or tenderness. No foreign bodies. Tympanic membrane is not erythematous and not bulging.  No middle ear effusion. No decreased hearing is noted.  Nose: Nose normal. No rhinorrhea. Right sinus exhibits no maxillary sinus tenderness and no frontal sinus tenderness. Left sinus exhibits no maxillary sinus tenderness and no frontal sinus tenderness.  Mouth/Throat: Uvula is midline, oropharynx is clear and moist and mucous membranes are normal. No oropharyngeal exudate, posterior oropharyngeal edema, posterior oropharyngeal erythema or  tonsillar  abscesses.  Eyes: Conjunctivae are normal.  Cardiovascular: Regular rhythm, normal heart sounds and normal pulses.  Pulmonary/Chest: Effort normal and breath sounds normal. She has no wheezes. She has no rhonchi. She has no rales.  Lymphadenopathy:       Head (right side): No submental, no submandibular, no tonsillar, no preauricular, no posterior auricular and no occipital adenopathy present.       Head (left side): No submental, no submandibular, no tonsillar, no preauricular, no posterior auricular and no occipital adenopathy present.    She has no cervical adenopathy.  Neurological: She is alert.  Skin: Skin is warm and dry.  Psychiatric: She has a normal mood and affect. Her speech is normal and behavior is normal. Thought content normal.  Vitals reviewed.      Assessment & Plan:   Problem List Items Addressed This Visit      Cardiovascular and Mediastinum   Stable angina [facility])    Patient is not having cp today. She is using nitro at baseline. She continues to follow with cardiology. Discussed case with Tullo, we suspect esophagitis may also be contributory. In the absence of acute symptoms today, I advised patient to continue routine follow up with cardiology and to use nitroglycerin as prescribed.       Type 2 DM with CKD stage 3 and hypertension (HCC)    FBG are close to target < 130. No hypoglycemic episodes. PCP and I agreed that she would benefit from consult with pharmacist Chrys Racer, for DM management/education as well as over medication reconciliation.         Other   Cough    Well appearing today. No acute distress. Reassured as patient course has improved. Recent CXR does not show PNA. Patient will continue to monitor symptoms and let me know if she doesn't continue to improve.            I am having Thresia L. Oppenheimer maintain her budesonide-formoterol, acetaminophen, docusate sodium, fluticasone, polyethylene glycol, glucose blood, ondansetron, metoprolol  succinate, PARoxetine, diclofenac sodium, ALPRAZolam, losartan, nitroGLYCERIN, BRILINTA, B-D UF III MINI PEN NEEDLES, isosorbide mononitrate, feeding supplement (GLUCERNA SHAKE), Insulin Lispro Prot & Lispro, LYRICA, furosemide, pantoprazole, rosuvastatin, and rosuvastatin.   No orders of the defined types were placed in this encounter.   Return precautions given.   Risks, benefits, and alternatives of the medications and treatment plan prescribed today were discussed, and patient expressed understanding.   Education regarding symptom management and diagnosis given to patient on AVS.  Continue to follow with [name], MD for routine health maintenance.   Pamela Collier and I agreed with plan.   Mable Paris, FNP

## 2017-07-13 NOTE — Assessment & Plan Note (Signed)
Patient is not having cp today. She is using nitro at baseline. She continues to follow with cardiology. Discussed case with Tullo, we suspect esophagitis may also be contributory. In the absence of acute symptoms today, I advised patient to continue routine follow up with cardiology and to use nitroglycerin as prescribed.

## 2017-07-13 NOTE — Assessment & Plan Note (Signed)
FBG are close to target < 130. No hypoglycemic episodes. PCP and I agreed that she would benefit from consult with pharmacist Chrys Racer, for DM management/education as well as over medication reconciliation.

## 2017-07-13 NOTE — Telephone Encounter (Signed)
-----   Message from Burnard Hawthorne, [place] sent at 07/13/2017  2:15 PM EST ----- Would like for this patient to see you DM and overall medication adherence. She needs education , med rec as she seemed to be a little confused.  Would mind giving her a call?  Spoke with Tullo about and she agreed.

## 2017-07-14 DIAGNOSIS — M6281 Muscle weakness (generalized): Secondary | ICD-10-CM | POA: Diagnosis not present

## 2017-07-14 DIAGNOSIS — G250 Essential tremor: Secondary | ICD-10-CM | POA: Diagnosis not present

## 2017-07-14 DIAGNOSIS — I129 Hypertensive chronic kidney disease with stage 1 through stage 4 chronic kidney disease, or unspecified chronic kidney disease: Secondary | ICD-10-CM | POA: Diagnosis not present

## 2017-07-14 DIAGNOSIS — E1122 Type 2 diabetes mellitus with diabetic chronic kidney disease: Secondary | ICD-10-CM | POA: Diagnosis not present

## 2017-07-14 DIAGNOSIS — I2510 Atherosclerotic heart disease of native coronary artery without angina pectoris: Secondary | ICD-10-CM | POA: Diagnosis not present

## 2017-07-14 DIAGNOSIS — N183 Chronic kidney disease, stage 3 (moderate): Secondary | ICD-10-CM | POA: Diagnosis not present

## 2017-07-15 DIAGNOSIS — N183 Chronic kidney disease, stage 3 (moderate): Secondary | ICD-10-CM | POA: Diagnosis not present

## 2017-07-15 DIAGNOSIS — I2510 Atherosclerotic heart disease of native coronary artery without angina pectoris: Secondary | ICD-10-CM | POA: Diagnosis not present

## 2017-07-15 DIAGNOSIS — I129 Hypertensive chronic kidney disease with stage 1 through stage 4 chronic kidney disease, or unspecified chronic kidney disease: Secondary | ICD-10-CM | POA: Diagnosis not present

## 2017-07-15 DIAGNOSIS — M6281 Muscle weakness (generalized): Secondary | ICD-10-CM | POA: Diagnosis not present

## 2017-07-15 DIAGNOSIS — E1122 Type 2 diabetes mellitus with diabetic chronic kidney disease: Secondary | ICD-10-CM | POA: Diagnosis not present

## 2017-07-15 DIAGNOSIS — G250 Essential tremor: Secondary | ICD-10-CM | POA: Diagnosis not present

## 2017-07-16 ENCOUNTER — Ambulatory Visit: Payer: Medicare Other | Admitting: Family Medicine

## 2017-07-17 DIAGNOSIS — M6281 Muscle weakness (generalized): Secondary | ICD-10-CM | POA: Diagnosis not present

## 2017-07-17 DIAGNOSIS — I2510 Atherosclerotic heart disease of native coronary artery without angina pectoris: Secondary | ICD-10-CM | POA: Diagnosis not present

## 2017-07-17 DIAGNOSIS — I129 Hypertensive chronic kidney disease with stage 1 through stage 4 chronic kidney disease, or unspecified chronic kidney disease: Secondary | ICD-10-CM | POA: Diagnosis not present

## 2017-07-17 DIAGNOSIS — E1122 Type 2 diabetes mellitus with diabetic chronic kidney disease: Secondary | ICD-10-CM | POA: Diagnosis not present

## 2017-07-17 DIAGNOSIS — G250 Essential tremor: Secondary | ICD-10-CM | POA: Diagnosis not present

## 2017-07-17 DIAGNOSIS — N183 Chronic kidney disease, stage 3 (moderate): Secondary | ICD-10-CM | POA: Diagnosis not present

## 2017-07-20 DIAGNOSIS — E1122 Type 2 diabetes mellitus with diabetic chronic kidney disease: Secondary | ICD-10-CM | POA: Diagnosis not present

## 2017-07-20 DIAGNOSIS — M6281 Muscle weakness (generalized): Secondary | ICD-10-CM | POA: Diagnosis not present

## 2017-07-20 DIAGNOSIS — I2510 Atherosclerotic heart disease of native coronary artery without angina pectoris: Secondary | ICD-10-CM | POA: Diagnosis not present

## 2017-07-20 DIAGNOSIS — G250 Essential tremor: Secondary | ICD-10-CM | POA: Diagnosis not present

## 2017-07-20 DIAGNOSIS — I129 Hypertensive chronic kidney disease with stage 1 through stage 4 chronic kidney disease, or unspecified chronic kidney disease: Secondary | ICD-10-CM | POA: Diagnosis not present

## 2017-07-20 DIAGNOSIS — N183 Chronic kidney disease, stage 3 (moderate): Secondary | ICD-10-CM | POA: Diagnosis not present

## 2017-07-22 DIAGNOSIS — N183 Chronic kidney disease, stage 3 (moderate): Secondary | ICD-10-CM | POA: Diagnosis not present

## 2017-07-22 DIAGNOSIS — E1122 Type 2 diabetes mellitus with diabetic chronic kidney disease: Secondary | ICD-10-CM | POA: Diagnosis not present

## 2017-07-22 DIAGNOSIS — I129 Hypertensive chronic kidney disease with stage 1 through stage 4 chronic kidney disease, or unspecified chronic kidney disease: Secondary | ICD-10-CM | POA: Diagnosis not present

## 2017-07-22 DIAGNOSIS — G250 Essential tremor: Secondary | ICD-10-CM | POA: Diagnosis not present

## 2017-07-22 DIAGNOSIS — I2510 Atherosclerotic heart disease of native coronary artery without angina pectoris: Secondary | ICD-10-CM | POA: Diagnosis not present

## 2017-07-22 DIAGNOSIS — M6281 Muscle weakness (generalized): Secondary | ICD-10-CM | POA: Diagnosis not present

## 2017-07-23 ENCOUNTER — Telehealth: Payer: Self-pay | Admitting: Internal Medicine

## 2017-07-27 DIAGNOSIS — M6281 Muscle weakness (generalized): Secondary | ICD-10-CM | POA: Diagnosis not present

## 2017-07-27 DIAGNOSIS — I129 Hypertensive chronic kidney disease with stage 1 through stage 4 chronic kidney disease, or unspecified chronic kidney disease: Secondary | ICD-10-CM | POA: Diagnosis not present

## 2017-07-27 DIAGNOSIS — I2510 Atherosclerotic heart disease of native coronary artery without angina pectoris: Secondary | ICD-10-CM | POA: Diagnosis not present

## 2017-07-27 DIAGNOSIS — G250 Essential tremor: Secondary | ICD-10-CM | POA: Diagnosis not present

## 2017-07-27 DIAGNOSIS — N183 Chronic kidney disease, stage 3 (moderate): Secondary | ICD-10-CM | POA: Diagnosis not present

## 2017-07-27 DIAGNOSIS — E1122 Type 2 diabetes mellitus with diabetic chronic kidney disease: Secondary | ICD-10-CM | POA: Diagnosis not present

## 2017-07-28 ENCOUNTER — Ambulatory Visit (INDEPENDENT_AMBULATORY_CARE_PROVIDER_SITE_OTHER): Payer: Medicare Other | Admitting: Internal Medicine

## 2017-07-28 ENCOUNTER — Encounter: Payer: Self-pay | Admitting: Internal Medicine

## 2017-07-28 VITALS — BP 140/62 | HR 69 | Temp 98.4°F | Resp 16 | Ht 67.0 in | Wt 184.2 lb

## 2017-07-28 DIAGNOSIS — I255 Ischemic cardiomyopathy: Secondary | ICD-10-CM

## 2017-07-28 DIAGNOSIS — R0789 Other chest pain: Secondary | ICD-10-CM | POA: Diagnosis not present

## 2017-07-28 DIAGNOSIS — F418 Other specified anxiety disorders: Secondary | ICD-10-CM | POA: Diagnosis not present

## 2017-07-28 DIAGNOSIS — Z1231 Encounter for screening mammogram for malignant neoplasm of breast: Secondary | ICD-10-CM

## 2017-07-28 DIAGNOSIS — Z1239 Encounter for other screening for malignant neoplasm of breast: Secondary | ICD-10-CM

## 2017-07-28 DIAGNOSIS — Z79899 Other long term (current) drug therapy: Secondary | ICD-10-CM | POA: Insufficient documentation

## 2017-07-28 NOTE — Progress Notes (Signed)
Subjective:  Patient ID: Pamela Collier, female    DOB: May 11, 1941  Age: 77 y.o. MRN: [id number]  CC: The primary encounter diagnosis was Breast cancer screening. Diagnoses of Polypharmacy and Depression with anxiety were also pertinent to this visit.  HPI MERLIN EGE presents for follow up on recent episode of right sided chest pain. Patient is a difficult historian but states that she walked into the office recently because she had an episode of right sided chest pain that  occurred twice and lasted for less than 5 seconds .  She was concerned that she might be having a stroke or heart attack.   Patient cannot provide a detailed history  But states that felt like a grabbing pain that lasted < 5 seconds several weeks ago. The pain was not accompanied by diaphoresis , nausea, or dyspnea.    Since her last visit has seen GI  Note from Feb  1 reviewed.  no EGD planned due to risk of procedure. Apparently she has a long History of dysphagia by prior workup in 2014 , did not recall that.(noted in [place]) . She underwent a barium swallow ordered by Vira Agar. And confirmed the diagnosis of Pill esophagitis .  symptoms have improved with cessation of  alendronate.   Chest x ray Feb 26  Ordered by pulmonary showed Chronic bronchitic changes.   Having trouble remembering names of medications.  Using a calendar to keep track of appointments  Using a pill box to manage medications  But not sure if taking paxil or alprazolam   MMSE:  July 28 2017 [facility]  [place],  [place]?   2n tires  [employer] 3  Of 3 qtrs in 1.50?  4  , no 6  Recall  3/3 wth a little prodding   I'm going through a lot at home."  Husband of 55 years  is "sick  (he walks worse than I do)"  But then also says that her husband expects her to cook, empty the garbage, etc,  Her family is in [place] and he has no family members In close proximity who are willing to help her .   " I have  do everything: wash dishes,   Make the bed,  " eats out a lot because she doesn't feel like fixing him a meal.   Finishing physical therapy  At home, lifting weights,  Swinging legs  Etc,  Last session is tomorrow . She wants to attend a community program that her friend goes to since her stroke.    Outpatient Medications Prior to Visit  Medication Sig Dispense Refill  . acetaminophen (TYLENOL) 500 MG tablet Take 1,000 mg by mouth every 6 (six) hours as needed for mild pain or headache.     . ALPRAZolam (XANAX) 0.25 MG tablet Take 1 tablet (0.25 mg total) by mouth daily. 30 tablet 2  . B-D UF III MINI PEN NEEDLES 31G X 5 MM MISC USE THREE TIMES A DAY 270 each 3  . BRILINTA 90 MG TABS tablet Take 1 tablet (90 mg total) by mouth 2 (two) times daily. 180 tablet 0  . budesonide-formoterol (SYMBICORT) 160-4.5 MCG/ACT inhaler Inhale 2 puffs into the lungs 2 (two) times daily. 1 Inhaler 0  . diclofenac sodium (VOLTAREN) 1 % GEL Apply 2 g topically 4 (four) times daily.    [name] docusate sodium (COLACE) 100 MG capsule Take 100 mg by mouth 2 (two) times daily as needed for mild  constipation. Reported on 11/09/2015    . feeding supplement, GLUCERNA SHAKE, (GLUCERNA SHAKE) LIQD Take 237 mLs by mouth 2 (two) times daily between meals. 60 Can 0  . fluticasone (FLONASE) 50 MCG/ACT nasal spray Place 2 sprays into both nostrils daily as needed for rhinitis.    . furosemide (LASIX) 20 MG tablet Take 1 tablet (20 mg total) by mouth every other day. 30 tablet 0  . glucose blood (ACCU-CHEK AVIVA PLUS) test strip USE TO CHECK GLUCOSE  THREE TIMES DAILY 300 each 3  . Insulin Lispro Prot & Lispro (HUMALOG MIX 75/25 KWIKPEN) (75-25) 100 UNIT/ML Kwikpen Inject 30 Units into the skin 2 (two) times daily with a meal. 44 units in the morning and 60 units in the evening 15 mL 11  . isosorbide mononitrate (IMDUR) 30 MG 24 hr tablet TAKE 1 TABLET DAILY 90 tablet 1  . losartan (COZAAR) 100 MG tablet TAKE 1 TABLET DAILY 90 tablet 1  . LYRICA 100 MG capsule TAKE  1 CAPSULE BY MOUTH THREE TIMES DAILY 90 capsule 5  . metoprolol succinate (TOPROL-XL) 25 MG 24 hr tablet Take 1 tablet (25 mg total) by mouth daily. 90 tablet 3  . nitroGLYCERIN (NITROLINGUAL) 0.4 MG/SPRAY spray Place 1 spray under the tongue every 5 (five) minutes x 3 doses as needed for chest pain. 12 g 12  . ondansetron (ZOFRAN) 4 MG tablet Take 1 tablet (4 mg total) by mouth every 8 (eight) hours as needed for nausea or vomiting. 20 tablet 0  . pantoprazole (PROTONIX) 40 MG tablet Take 1 tablet (40 mg total) by mouth 2 (two) times daily. 60 tablet 1  . PARoxetine (PAXIL) 20 MG tablet TAKE ONE TABLET BY MOUTH ONCE DAILY 90 tablet 1  . polyethylene glycol (MIRALAX / GLYCOLAX) packet Take 17 g by mouth daily as needed for mild constipation. Reported on 10/25/2015    . rosuvastatin (CRESTOR) 20 MG tablet TAKE ONE TABLET BY MOUTH ONCE DAILY 90 tablet 1  . rosuvastatin (CRESTOR) 20 MG tablet Take 1 tablet (20 mg total) by mouth daily. (Patient not taking: Reported on 07/28/2017) 90 tablet 0   Facility-Administered Medications Prior to Visit  Medication Dose Route Frequency Provider Last Rate Last Dose  . albuterol (PROVENTIL) (5 MG/ML) 0.5% nebulizer solution 2.5 mg  2.5 mg Nebulization Once [name], MD        Review of Systems;  Patient denies headache, fevers, malaise, unintentional weight loss, skin rash, eye pain, sinus congestion and sinus pain, sore throat, dysphagia,  hemoptysis , cough, dyspnea, wheezing, chest pain, palpitations, orthopnea, edema, abdominal pain, nausea, melena, diarrhea, constipation, flank pain, dysuria, hematuria, urinary  Frequency, nocturia, numbness, tingling, seizures,  Focal weakness, Loss of consciousness,  Tremor, insomnia, depression, anxiety, and suicidal ideation.      Objective:  BP 140/62 (BP Location: Left Arm, Patient Position: Sitting, Cuff Size: Normal)   Pulse 69   Temp 98.4 F (36.9 C) (Oral)   Resp 16   Ht 5\' 7"  (1.702 m)   Wt 184 lb 3.2  oz (83.6 kg)   SpO2 95%   BMI 28.85 kg/m   BP Readings from Last 3 Encounters:  07/28/17 140/62  07/13/17 (!) 142/58  07/07/17 (!) 150/60    Wt Readings from Last 3 Encounters:  07/28/17 184 lb 3.2 oz (83.6 kg)  07/13/17 185 lb 8 oz (84.1 kg)  07/07/17 186 lb (84.4 kg)    General appearance: alert, cooperative and appears stated age Ears: normal TM's  and external ear canals both ears Throat: lips, mucosa, and tongue normal; teeth and gums normal Neck: no adenopathy, no carotid bruit, supple, symmetrical, trachea midline and thyroid not enlarged, symmetric, no tenderness/mass/nodules Back: symmetric, no curvature. ROM normal. No CVA tenderness. Lungs: clear to auscultation bilaterally Heart: regular rate and rhythm, S1, S2 normal, no murmur, click, rub or gallop Abdomen: soft, non-tender; bowel sounds normal; no masses,  no organomegaly Pulses: 2+ and symmetric Skin: Skin color, texture, turgor normal. No rashes or lesions Lymph nodes: Cervical, supraclavicular, and axillary nodes normal.  Lab Results  Component Value Date   HGBA1C 7.5 (H) 06/08/2017   HGBA1C 7.5 (H) 10/08/2016   HGBA1C 7.1 06/18/2016    Lab Results  Component Value Date   CREATININE 1.48 (H) 06/08/2017   CREATININE 1.35 (H) 06/04/2017   CREATININE 1.54 (H) 06/03/2017    Lab Results  Component Value Date   WBC 5.9 06/04/2017   HGB 11.8 (L) 06/04/2017   HCT 35.7 06/04/2017   PLT 239 06/04/2017   GLUCOSE 170 (H) 06/08/2017   CHOL 124 11/15/2016   TRIG 175 (H) 11/15/2016   HDL 29 (L) 11/15/2016   LDLDIRECT 102.0 03/14/2016   LDLCALC 60 11/15/2016   ALT 9 06/08/2017   AST 15 06/08/2017   NA 140 06/08/2017   K 3.9 06/08/2017   CL 104 06/08/2017   CREATININE 1.48 (H) 06/08/2017   BUN 27 (H) 06/08/2017   CO2 29 06/08/2017   TSH 0.953 11/15/2016   INR 1.2 (H) 02/02/2015   HGBA1C 7.5 (H) 06/08/2017   MICROALBUR 28.9 (H) 09/26/2015    Dg Chest 2 View  Result Date: 07/07/2017 CLINICAL DATA:   Pleural effusion, cough, history of coronary artery disease with stent placement, hypertension, sarcoidosis, diabetes. EXAM: CHEST  2 VIEW COMPARISON:  Chest x-ray of January 23rd 2019 and chest CT scan of the same date FINDINGS: The lungs are adequately inflated. The interstitial markings are coarse. There is no alveolar infiltrate. No pleural effusion is evident today. The heart and pulmonary vascularity are normal. There is calcification in the wall of the aortic arch. Old rib deformities laterally on the left are observed. There is mild degenerative disc disease at multiple lower thoracic levels. IMPRESSION: Chronic bronchitic changes. Some of the interstitial findings may reflect known sarcoidosis. No alveolar pneumonia, CHF, or pleural effusion. Thoracic aortic atherosclerosis. Electronically Signed   By: David  Martinique M.D.   On: 07/07/2017 16:37    Assessment & Plan:   Problem List Items Addressed This Visit    Polypharmacy    She is having trouble managing her medications .  Order for [facility] to assist with medication management      Relevant Orders   Ambulatory referral to [facility]   Depression with anxiety    She is unsure if she is taking paxil or alprazolam on a regular basis. Advised to check her pill bottles and box at home .  Will resume paxil at a lower dose if she has not been taking it for the past month        Other Visit Diagnoses    Breast cancer screening    -  Primary   Relevant Orders   MM DIGITAL SCREENING BILATERAL     A total of 25 minutes of face to face time was spent with patient more than half of which was spent in counselling about the above mentioned conditions  and coordination of care .  I am having Terrence Dupont  L. Loomer maintain her budesonide-formoterol, acetaminophen, docusate sodium, fluticasone, polyethylene glycol, glucose blood, ondansetron, metoprolol succinate, PARoxetine, diclofenac sodium, ALPRAZolam, losartan, nitroGLYCERIN, BRILINTA,  B-D UF III MINI PEN NEEDLES, isosorbide mononitrate, feeding supplement (GLUCERNA SHAKE), Insulin Lispro Prot & Lispro, LYRICA, furosemide, pantoprazole, and rosuvastatin.  No orders of the defined types were placed in this encounter.   Medications Discontinued During This Encounter  Medication Reason  . rosuvastatin (CRESTOR) 20 MG tablet Duplicate    Follow-up: No Follow-up on file.   [name], MD

## 2017-07-28 NOTE — Assessment & Plan Note (Signed)
She is having trouble managing her medications .  Order for [facility] to assist with medication management

## 2017-07-28 NOTE — Assessment & Plan Note (Signed)
Recurrent,  Atypical ,  Occurring on the right side more recently.  Reassure patient that the character of her recent episodes of right sided chest pain suggests muscle spasm, not CVA or AMI

## 2017-07-28 NOTE — Assessment & Plan Note (Signed)
She is unsure if she is taking paxil or alprazolam on a regular basis. Advised to check her pill bottles and box at home .  Will resume paxil at a lower dose if she has not been taking it for the past month

## 2017-07-28 NOTE — Patient Instructions (Signed)
Go home and check to see if you are taking paroxetine . (paxil)   Paxil should be taken every day to manage depression and  anxiety  The Xanax should  be used sparingly for severe anger or anxiety

## 2017-07-29 ENCOUNTER — Other Ambulatory Visit: Payer: Self-pay | Admitting: Internal Medicine

## 2017-07-29 DIAGNOSIS — I129 Hypertensive chronic kidney disease with stage 1 through stage 4 chronic kidney disease, or unspecified chronic kidney disease: Secondary | ICD-10-CM | POA: Diagnosis not present

## 2017-07-29 DIAGNOSIS — I2510 Atherosclerotic heart disease of native coronary artery without angina pectoris: Secondary | ICD-10-CM | POA: Diagnosis not present

## 2017-07-29 DIAGNOSIS — E1122 Type 2 diabetes mellitus with diabetic chronic kidney disease: Secondary | ICD-10-CM | POA: Diagnosis not present

## 2017-07-29 DIAGNOSIS — G250 Essential tremor: Secondary | ICD-10-CM | POA: Diagnosis not present

## 2017-07-29 DIAGNOSIS — N183 Chronic kidney disease, stage 3 (moderate): Secondary | ICD-10-CM | POA: Diagnosis not present

## 2017-07-29 DIAGNOSIS — M6281 Muscle weakness (generalized): Secondary | ICD-10-CM | POA: Diagnosis not present

## 2017-07-30 NOTE — Telephone Encounter (Signed)
Last OV 07/28/17 last filled 11/10/16 30 2rf

## 2017-08-03 ENCOUNTER — Ambulatory Visit (INDEPENDENT_AMBULATORY_CARE_PROVIDER_SITE_OTHER): Payer: Medicare Other | Admitting: Pharmacist

## 2017-08-03 ENCOUNTER — Encounter: Payer: Self-pay | Admitting: Pharmacist

## 2017-08-03 VITALS — BP 121/71 | HR 61 | Ht 67.0 in | Wt 188.0 lb

## 2017-08-03 DIAGNOSIS — Z9181 History of falling: Secondary | ICD-10-CM

## 2017-08-03 DIAGNOSIS — Z79899 Other long term (current) drug therapy: Secondary | ICD-10-CM | POA: Diagnosis not present

## 2017-08-03 DIAGNOSIS — I129 Hypertensive chronic kidney disease with stage 1 through stage 4 chronic kidney disease, or unspecified chronic kidney disease: Secondary | ICD-10-CM | POA: Diagnosis not present

## 2017-08-03 DIAGNOSIS — I255 Ischemic cardiomyopathy: Secondary | ICD-10-CM

## 2017-08-03 DIAGNOSIS — N183 Chronic kidney disease, stage 3 unspecified: Secondary | ICD-10-CM

## 2017-08-03 DIAGNOSIS — E1122 Type 2 diabetes mellitus with diabetic chronic kidney disease: Secondary | ICD-10-CM

## 2017-08-03 MED ORDER — INSULIN LISPRO PROT & LISPRO (75-25 MIX) 100 UNIT/ML KWIKPEN: PEN_INJECTOR | SUBCUTANEOUS | 11 refills | Status: DC

## 2017-08-03 NOTE — Patient Instructions (Addendum)
Thanks for coming to see me today!   1. Buy some zantac (generic name is "ranitidine") 75 mg tablets - take 1 by mouth as needed for stomach acid/acid reflux/heart burn. If you need protonix (generic name is "pantoprazole") you would need to take it every day for it to truly work.   2. Decrease your insulin to 44 units in the morning and 55 units in the evening. It is OK if your blood sugar is in the low-100s when you wake up.   3. Refill your metoprolol and take 1 pill once a day  4. Test your blood sugar in the mornings and at bedtime so we can see how the insulin is working for you. Bring in your blood sugar logs or your meter next time you see me  5. Bring in bank statements or a copy of your most recent tax return next time you see me.   Come back to see me in 2-4 weeks. My phone number is [phone]

## 2017-08-03 NOTE — Progress Notes (Addendum)
S:     Chief Complaint  Patient presents with  . Medication Management    diabetes    Patient arrives in good spirits, ambulating without assistance.  Presents for medication reconciliation and diabetes evaluation, education, and management at the request of Mable Paris, NP/Dr. Derrel Nip (referred on 07/13/17). Last seen by primary care provider on 07/28/17 - at that time it was unclear if she was regularly taking paroxetine and/or alprazolam and she was referred to home health.   Today, patient brings in pill boxes and pill bottles. Missing metoprolol and taking pantoprazole PRN (<1x/month). Re-initiated paroxetine this week and is taking alprazolam daily in the mornings. Had been on paroxetine 2-3 months.   Reports frequent stress with her husband. Reports a fall at home after slipping on wet floor. Reports soreness on right side, not sure if she struck her head or not. Denies visual changes, changes in gait/strenght, no one-sided weakness.  Tests blood sugar BID (fasting, AC supper). Reports frequent hypoglycemia (1 episode/wk). Inquires about CGM but deferred 2/2 cost after discussion. Had breakfast this morning. Reports she gives herself 3-4 units of pre-mixed insulin at bedtime (without food) when her CBGs run high after eating out. Just bought 6 boxes of premixed insulin but willing to try alternative insulins when she exhausts current supply.   Has hypoglycemia ~4 times a month. Usually has hypoglycemia in the early morning. Reports fastings of 98 this morning, fastings typically 80s-90s. Reports some in the 35s. Before supper - Lower 100s.   Patient reports Diabetes was diagnosed in her 49s, has been on insulin the whole time  Family/Social History: DM runs in her family  Insurance coverage/medication affordability: meds affordable at this time. Meds expensive per patient, spends $300 or more on medications each month. Most expensive = brillinta, Humalog mix 75/25, and symbicort.    Patient reports adherence with medications. Adherence is new, however. Uses a pill box Current diabetes medications include: Humalog mix 75/25 - 44 units in the morning and 60 units in the evening.  Current hypertension medications include: metoprolol succinate 25 mg daily, losartan 100 mg daily  Patient reported dietary habits: Eats 3 meals/day Breakfast:Oatmeal OR cereal Lunch:BLT + glucerna Dinner: string beans, salad, baked chicken Snacks:none Drinks: diet sodas, unsweetened tea, water, cranberry juice (1/2 glass/day)  Patient reported exercise habits: uses hand weights and body weight for muscle strengthening.    Patient reports nocturia 2x/night. Patient denies pain/burning on urination.  Patient reports neuropathy in feet on occasion "tingling" Patient reports visual changes (blurriness). Has an appointment in June with eye doctor.  Patient reports self foot exams. Denies issues. Follows with podiatrist.   O:  Physical Exam  Constitutional: She appears well-developed and well-nourished.     Review of Systems  Cardiovascular: Negative for chest pain.  Gastrointestinal: Negative for abdominal pain, diarrhea, nausea and vomiting.  Musculoskeletal: Positive for falls (slipped on wet floor) and neck pain (mild neck soreness on R side after fall).  Neurological: Negative for tremors, speech change, loss of consciousness, weakness and headaches.  All other systems reviewed and are negative.    Lab Results  Component Value Date   HGBA1C 7.5 (H) 06/08/2017   Vitals:   08/03/17 1103  BP: 121/71  Pulse: 61    Lipid Panel     Component Value Date/Time   CHOL 124 11/15/2016 0540   CHOL 134 03/19/2012 0024   TRIG 175 (H) 11/15/2016 0540   TRIG 110 03/19/2012 0024   HDL 29 (  L) 11/15/2016 0540   HDL 41 03/19/2012 0024   CHOLHDL 4.3 11/15/2016 0540   VLDL 35 11/15/2016 0540   VLDL 22 03/19/2012 0024   LDLCALC 60 11/15/2016 0540   LDLCALC 71 03/19/2012 0024    LDLDIRECT 102.0 03/14/2016 1452   (06/08/17) GFR = ~44 (06/08/17) SCr = 1.48 - Cockcroft & Gault CrCl (adjusted body weight) = 35.8 ml/min   Clinical ASCVD: Yes ; Major ASCVD events:  symptomatic PAD s/p angioplasty August 2012; High-risk conditions: Age 47 or older, s/p multiple PCI, DM, HTN, CKD, h/o CHF  A/P: #Diabetes longstanding currently fairly controlled in patient with multiple comorbidities as evidenced by A1C and CBG readings. Patient reports hypoglycemic events and is able to verbalize appropriate hypoglycemia management plan. Patient reports adherence with medication. Control is suboptimal due to frequent hypoglycemia, inappropriate insulin administration, suspected frequent postprandial hyperglycemia, dietary indiscretion, sedentary lifestyle. - Decreased pre-mixed insulin (humalog max 75-25) to 44 units in the morning, 55 units in the evening. Would like to get her off of pre-mixed insulin given that she is likely NOT controlled after large evening meals (not enough bolus insulin), however still getting hypoglycemic in fasting state (too much basal insulin). Concern for insulin stacking and prolonged exposure given renal dysfunction. Cost is also a concern.  - Counseled on giving insulin ONLY with meals, twice daily.  - Asked her to test CBGs at bedtime and at fasting - Next A1C anticipated 09/06/17 or later  #ASCVD risk -secondary prevention in patient with DM, baseline LDL 70-189, stable ASCVD - high intensity statin indicated, LDL at goal of <70 at last measure. - Continued Aspirin 81 mg  - Continued rosuvastatin 20 mg.   #Hypertension longstanding currently well controlled.  Patient reports adherence with medication.  - Continue current meds  #Medication reconciliation/Polypharmacy - patient fairly well organized with aid of pill box, good recall of names of medications. Newly adherent to paroxetine, no side effects evident and pt seems to be tolerating well. Concerned with  reported fall and daily use of alprazolam  -Recommend continue paroxetine at current dose as patient is tolerating well and tablets were not scored. Reasonable to start at 10-20 mg/day when initiating therapy. Consider decrease to 10 mg daily if sx (HA, GI upset, sleep disturbance etc) occur. Did NOT discuss this with patient. -Recommend change alprazolam to PRN use and/or consider change to lorazepam in geriatric patient with decrease Phase 1 metabolism with fall hx. Did NOT discuss this with patient.  -Counseled on need for metoprolol -At patient's request, labeled each medication with indication on AVS -Recommended to pt that she try OTC ranitidine 75 mg daily instead of PRN pantoprazole to control occasional GERD sx.   #Medication assistance - patient expresses that her total household income likely exceeds cutoff for LIS/Extra help. Does endorse interest in applying for patient assistance programs. Pt may qualify but would need to discuss finances with husband.  -Patient to bring in copy of tax return or bank statements  -Will likely pursue approval through [employer] (humalog and basaglar) given they will waive out of pocket spend requirements for patients if copayments are unaffordable  Written patient instructions provided.  Total time in face to face counseling 50 minutes.    Follow up in Pharmacist Clinic Visit 2-4 weeks.  [name], Pharm.D., BCPS, CPP PGY2 Ambulatory Care Pharmacy Resident Phone: [phone]  Agree with plan as I recommended Chrys Racer to patient. She continues to follow with PCP, [place], as well. I have routed Caroline's  note to pcp as well so that she is aware.  Mable Paris, NP

## 2017-08-04 ENCOUNTER — Other Ambulatory Visit: Payer: Self-pay | Admitting: Internal Medicine

## 2017-08-04 ENCOUNTER — Telehealth: Payer: Self-pay | Admitting: Internal Medicine

## 2017-08-04 MED ORDER — METOPROLOL SUCCINATE ER 25 MG PO TB24: 25.0000 mg | ORAL_TABLET | Freq: Every day | ORAL | 0 refills | Status: DC

## 2017-08-04 NOTE — Telephone Encounter (Signed)
Pt refused the medication management services from [facility] today when they went out there.

## 2017-08-04 NOTE — Telephone Encounter (Signed)
Copied from [place] [id number]. Topic: Quick Communication - Rx Refill/Question >> Aug 04, 2017 11:30 AM Margot Ables wrote: Medication: pt is out of metoprolol - she normally gets thru mail order but she is wanting to get some from local pharmacy - please advise. Has the patient contacted their pharmacy? Yes   Preferred Pharmacy (with phone number or street name): pt wants some from  [facility] ([address]), [place] [phone] (Phone) [phone] (Fax) Regular order need sent to [facility] for 3 month supply.

## 2017-08-04 NOTE — Telephone Encounter (Signed)
Copied from [place] [phone]. Topic: Quick Communication - See Telephone Encounter >> Aug 04, 2017  1:43 PM Vernona Rieger wrote: CRM for notification. See Telephone encounter for: 08/04/17.  Inez Catalina RN from [facility] called and stated that they got a referral to admit her to their services. When they went there today, she refused the services.  Call back if needed [phone]

## 2017-08-04 NOTE — Assessment & Plan Note (Signed)
#  Medication reconciliation/Polypharmacy - patient fairly well organized with aid of pill box, good recall of names of medications. Newly adherent to paroxetine, no side effects evident and pt seems to be tolerating well. Concerned with reported fall and daily use of alprazolam  -Recommend continue paroxetine at current dose as patient is tolerating well and tablets were not scored. Reasonable to start at 10-20 mg/day when initiating therapy. Consider decrease to 10 mg daily if sx (HA, GI upset, sleep disturbance etc) occur. Did NOT discuss this with patient. -Recommend change alprazolam to PRN use and/or consider change to lorazepam in geriatric patient with decrease Phase 1 metabolism with fall hx. Did NOT discuss this with patient.  -Counseled on need for metoprolol -At patient's request, labeled each medication with indication on AVS -Recommended to pt that she try OTC ranitidine 75 mg daily instead of PRN pantoprazole to control occasional GERD sx.

## 2017-08-04 NOTE — Telephone Encounter (Signed)
Script sent  

## 2017-08-04 NOTE — Assessment & Plan Note (Addendum)
#  Diabetes longstanding currently fairly controlled in patient with multiple comorbidities as evidenced by A1C and CBG readings. Patient reports hypoglycemic events and is able to verbalize appropriate hypoglycemia management plan. Patient reports adherence with medication. Control is suboptimal due to frequent hypoglycemia, inappropriate insulin administration, suspected frequent postprandial hyperglycemia, dietary indiscretion, sedentary lifestyle. - Decreased pre-mixed insulin (humalog max 75-25) to 44 units in the morning, 55 units in the evening. Would like to get her off of pre-mixed insulin given that she is likely NOT controlled after large evening meals (not enough bolus insulin), however still getting hypoglycemic in fasting state (too much basal insulin). Concern for insulin stacking and prolonged exposure given renal dysfunction. Cost is also a concern.  - Counseled on giving insulin ONLY with meals, twice daily.  - Asked her to test CBGs at bedtime and at fasting - Next A1C anticipated 09/06/17 or later  #ASCVD risk -secondary prevention in patient with DM, baseline LDL 70-189, stable ASCVD - high intensity statin indicated, LDL at goal of <70 at last measure. - Continued Aspirin 81 mg  - Continued rosuvastatin 20 mg.   #Hypertension longstanding currently well controlled.  Patient reports adherence with medication.  - Continue current meds

## 2017-08-06 NOTE — Progress Notes (Signed)
Insurance verification for Prolia filed on [employer] Portal. 

## 2017-08-07 ENCOUNTER — Telehealth: Payer: Self-pay | Admitting: Family

## 2017-08-07 DIAGNOSIS — E1122 Type 2 diabetes mellitus with diabetic chronic kidney disease: Secondary | ICD-10-CM

## 2017-08-07 DIAGNOSIS — N183 Chronic kidney disease, stage 3 (moderate): Principal | ICD-10-CM

## 2017-08-07 DIAGNOSIS — I129 Hypertensive chronic kidney disease with stage 1 through stage 4 chronic kidney disease, or unspecified chronic kidney disease: Principal | ICD-10-CM

## 2017-08-07 NOTE — Telephone Encounter (Signed)
-----   Message from [name], [facility] sent at 08/04/2017  8:01 AM EDT -----  Adjusted her insulin with AM hypos. Better adherence with pill box, still missing metoprolol and pantoprazole (taking pantoprazole PRN so recommended she use OTC zantac instead as needed for improved efficacy w PRN use). She is back on paxil 20 mg daily, no ADE. Pills are not scored and she has large supply, reasonable to continue on 20 mg daily or decrease if ADE arise? She's also taking alprazolam daily (in AM slot of pill box). Recent fall, no residual sx except pain in neck, not sure if she hit head or not, denies s/sx stroke. Consider change alprazolam to PRN and/or change to lorazepam. Did NOT discuss these recommended changes with patient as defer to you.

## 2017-08-07 NOTE — Telephone Encounter (Signed)
Can we schedule an appt with Pamela Collier with me on the day she sees Pamela Collier on 4/22.    Please call pt and let her know I want to discuss the following with her and ask the below questions:   Any low blood sugars??   Falls - how frequent? Does she need Home evaluation?  Decrease paxil to 10mg .   Due to fall, change alprazolam daily to lorazepam.

## 2017-08-10 IMAGING — CR DG CHEST 2V
1 series · 2 of 2 positions shown · non-contrast
Comparison: PA and lateral chest x-ray February 09, 2015

CLINICAL DATA: Chest pain and shortness of breath, history of
cardiomyopathy, coronary artery disease with stent placement,
pleural effusions, COPD.

EXAM:
CHEST  2 VIEW

[Series 1: dg chest 2 view · 0.14mm/px · 2 of 2 slices shown]
[im 1/2]
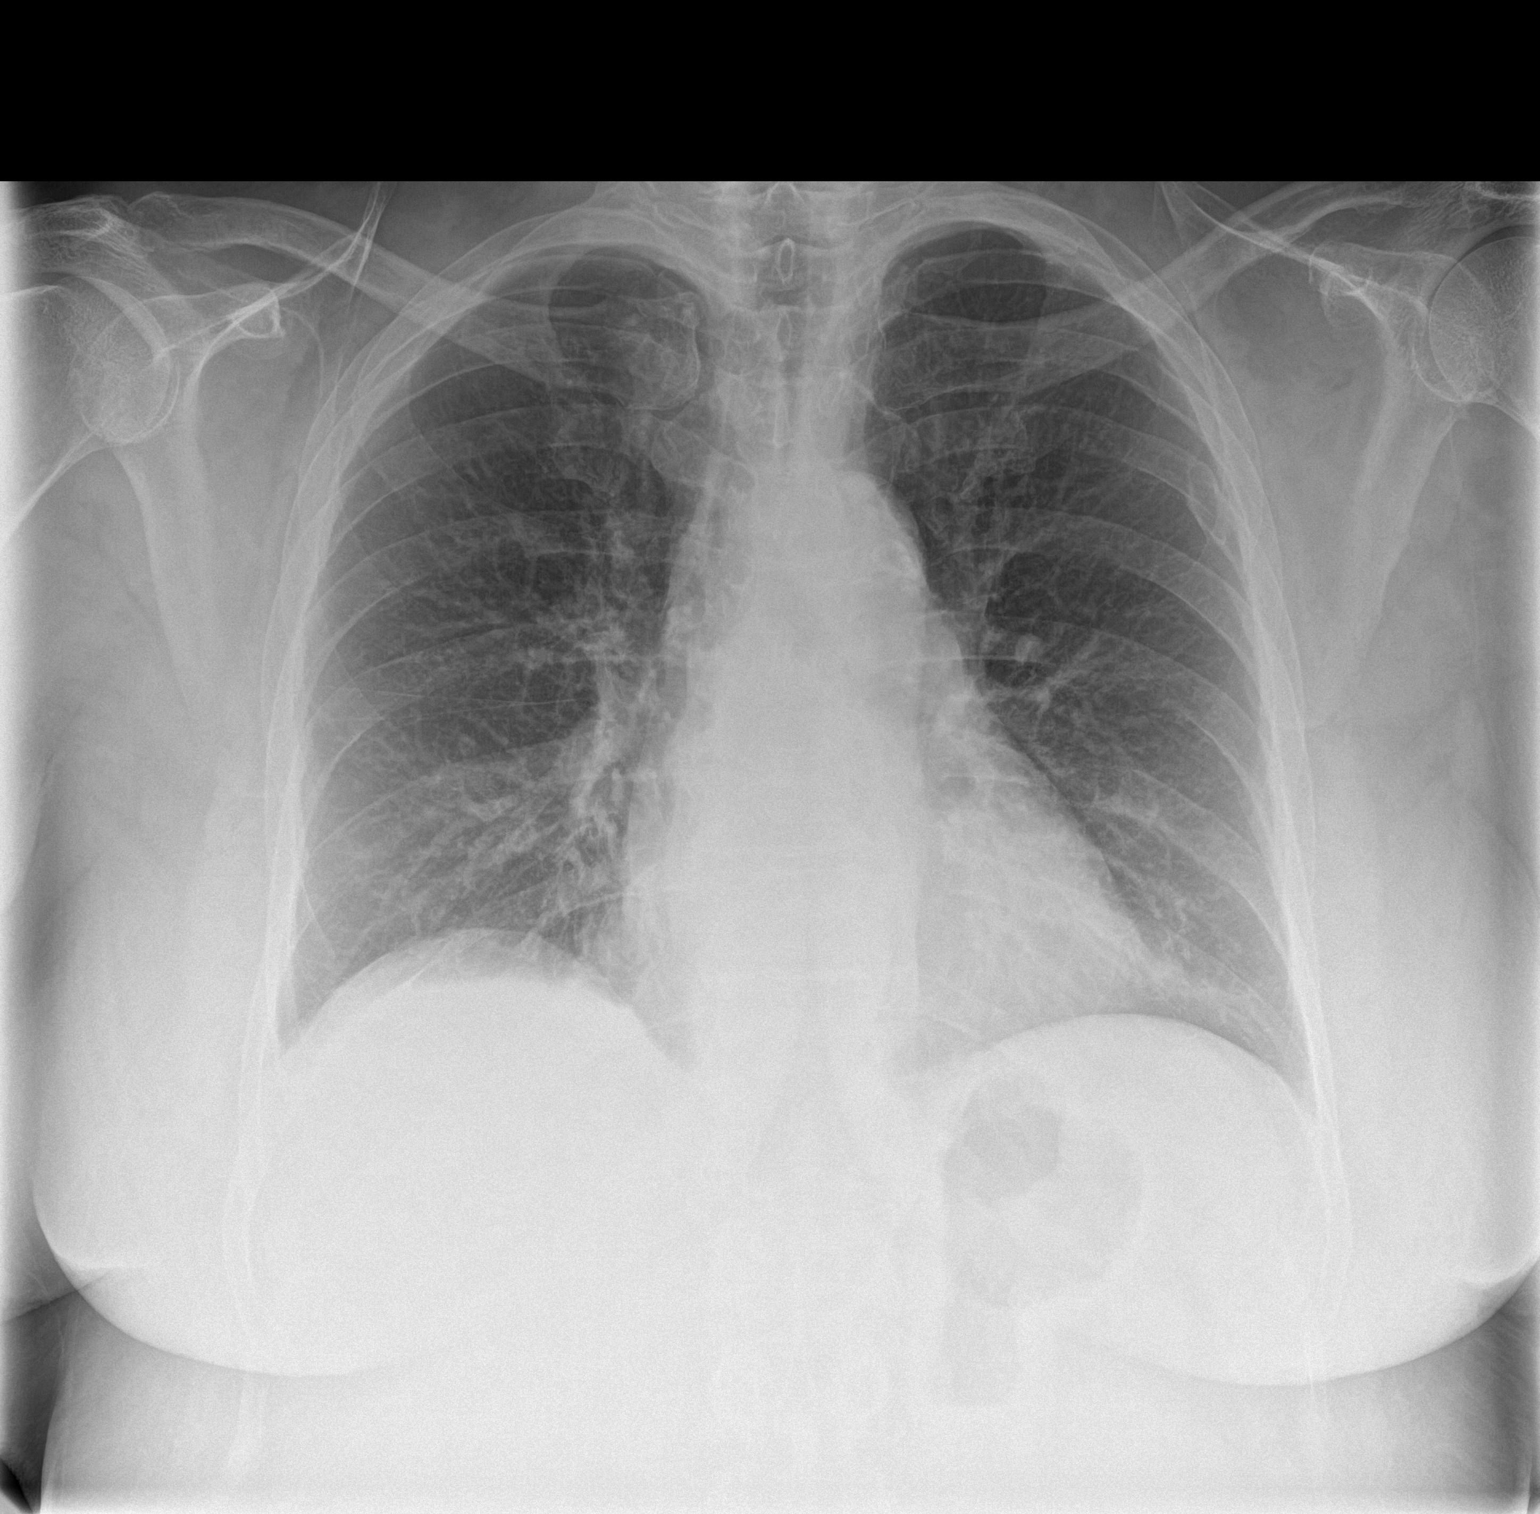
[im 2/2]
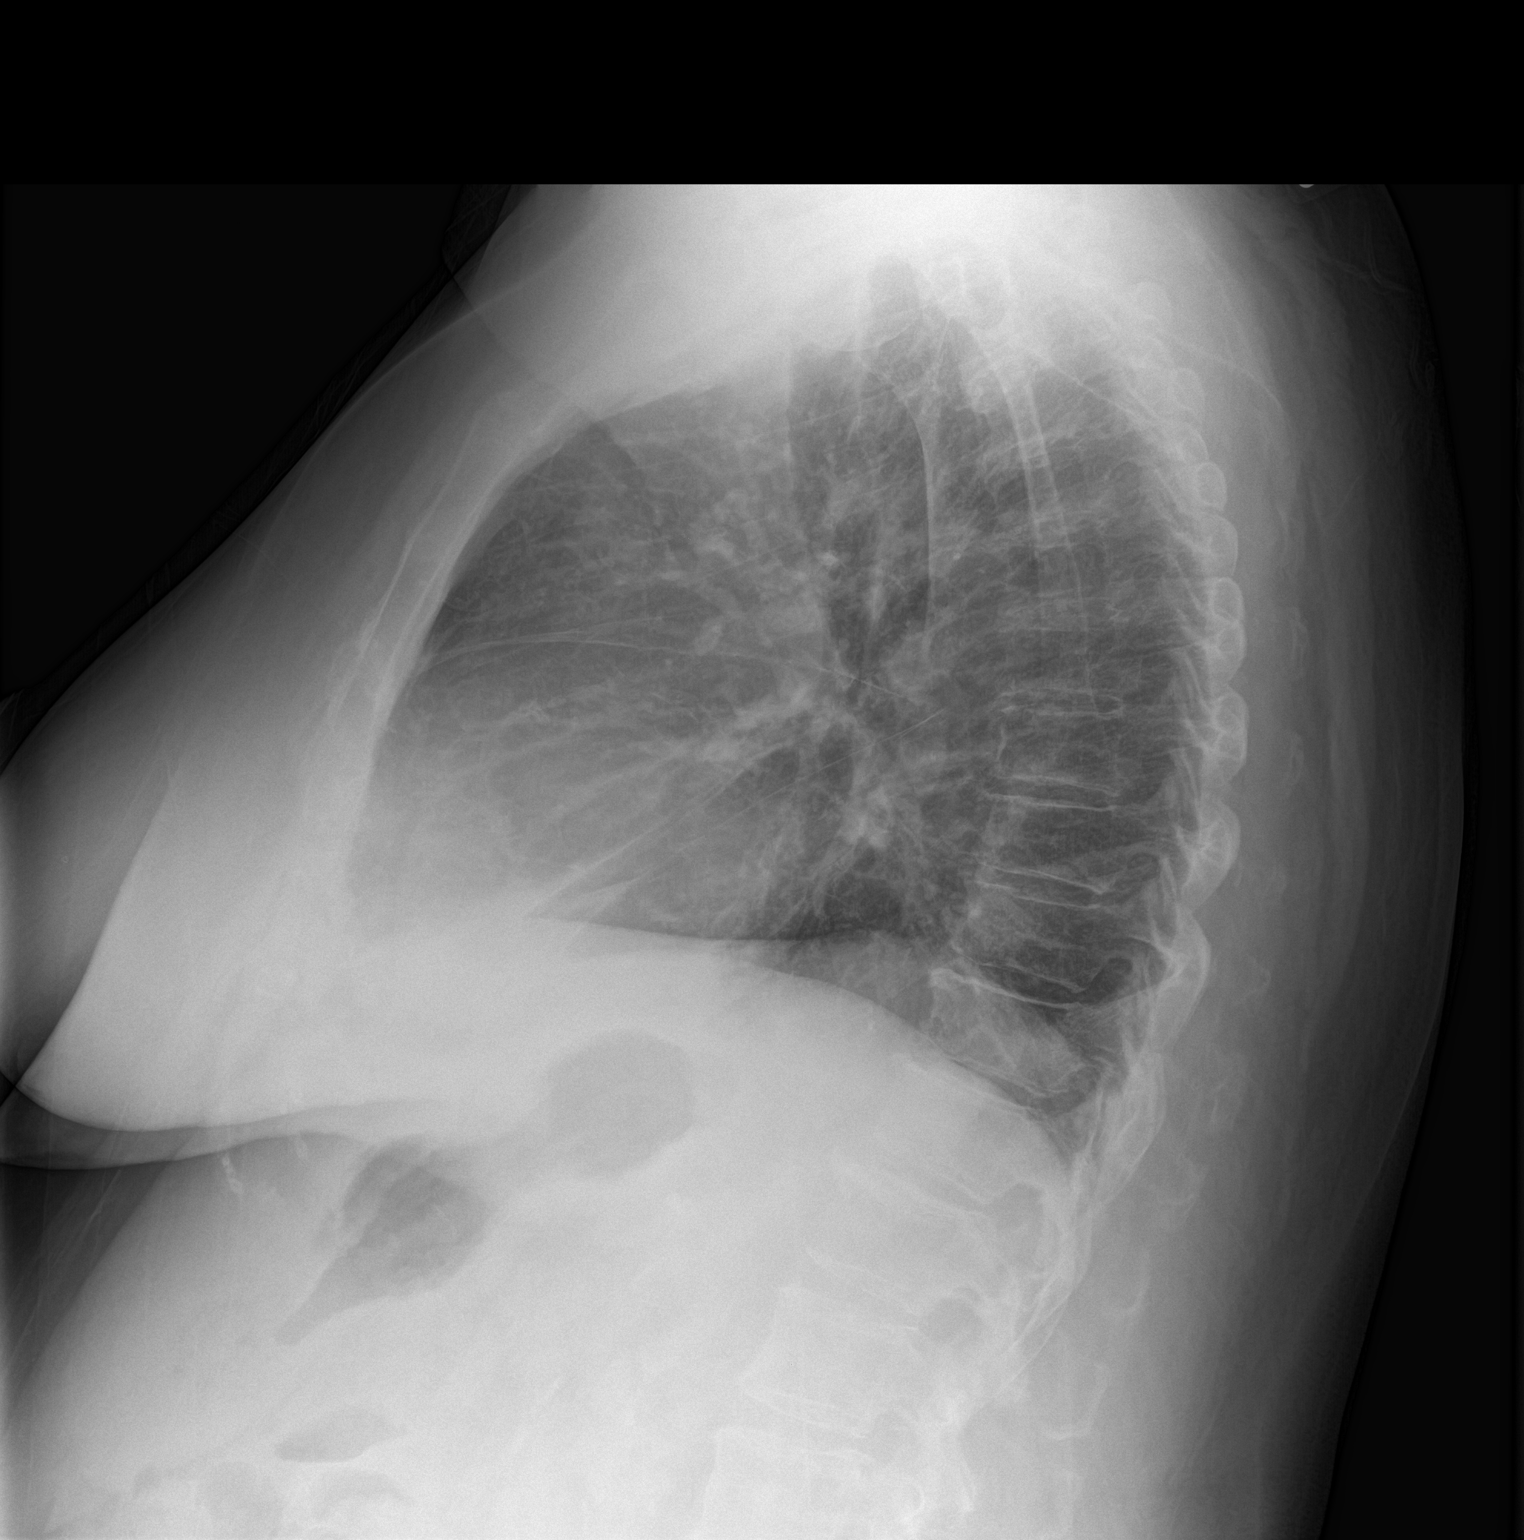

[2 of 2 positions shown; findings below may reference images not displayed]

FINDINGS: The lungs are adequately inflated. There is no focal infiltrate.
There is no pleural effusion. The interstitial markings are coarse
though stable. The heart and pulmonary vascularity are normal. There
is old deformity of the posterior lateral aspects of the left fourth
and fifth ribs. The thoracic vertebral bodies are preserved in
height.
IMPRESSION: Mild stable prominence of the pulmonary interstitial markings. There
is no alveolar pneumonia, pulmonary edema, nor other acute
cardiopulmonary abnormality.

## 2017-08-10 NOTE — Telephone Encounter (Signed)
Spoke with patient and advised her of below .   She states blood sugars are as stated 08/06/17 80 FBSam 147 pm, at first patient told me blood sugar was 47 in pm , had to verify with patient 3 times before could clearly get blood reading in pm .   08/07/16 171 FBS am , 159 pm,. 3/28 FBS 80 am, 147 pm  Patient denies any more falls.  Advised decreasing paxil ,she states she can't decrease paxil due to cost as well changing to the lorazepam.

## 2017-08-10 NOTE — Telephone Encounter (Signed)
Pamela Collier,  Could you reach out to patient to check in with her?   Referral to [facility]

## 2017-08-11 IMAGING — CR DG LUMBAR SPINE 2-3V
3 series · 3 of 3 positions shown · non-contrast
Comparison: None.

CLINICAL DATA: Recent dizzy Jiletu with fall, low back pain, initial
encounter

EXAM:
LUMBAR SPINE - 2-3 VIEW

[l-spine ap]
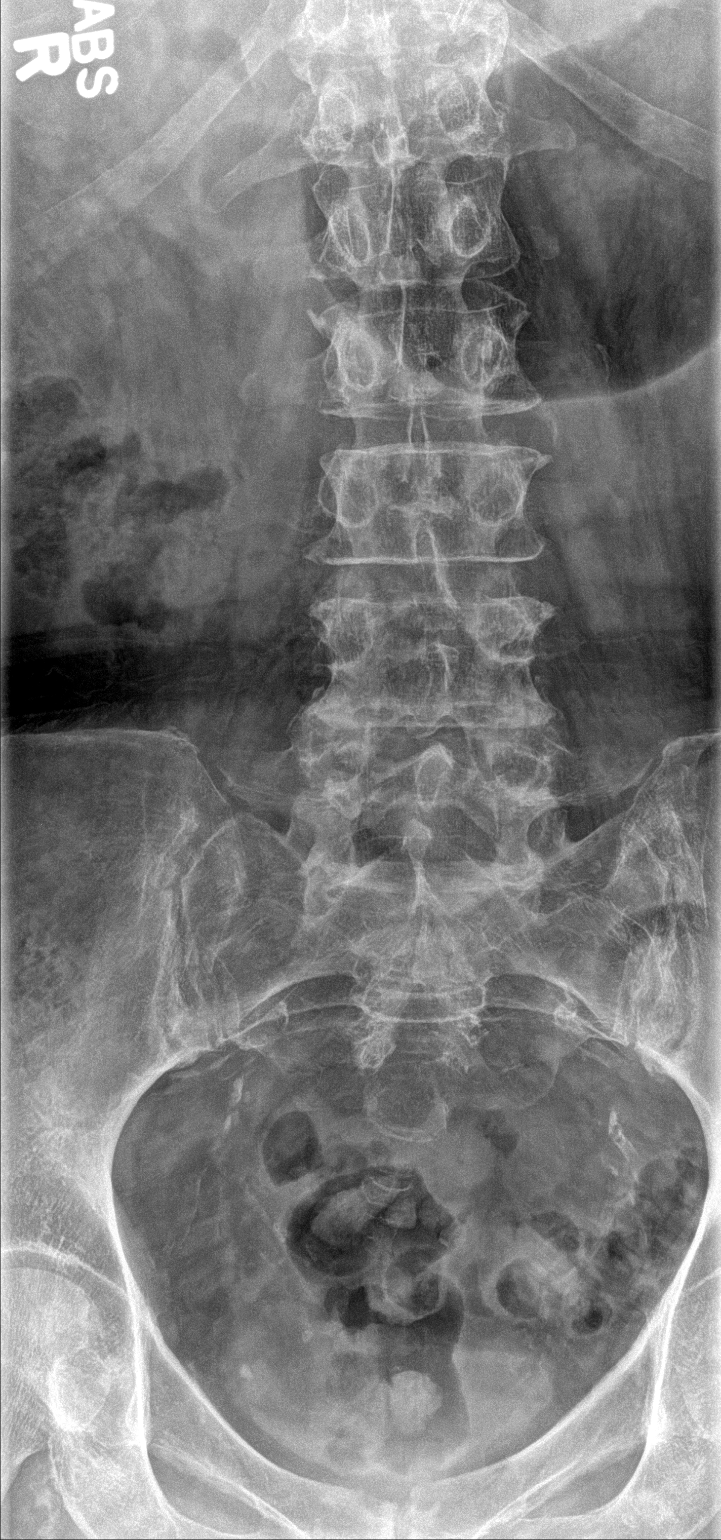

[l-spine lat]
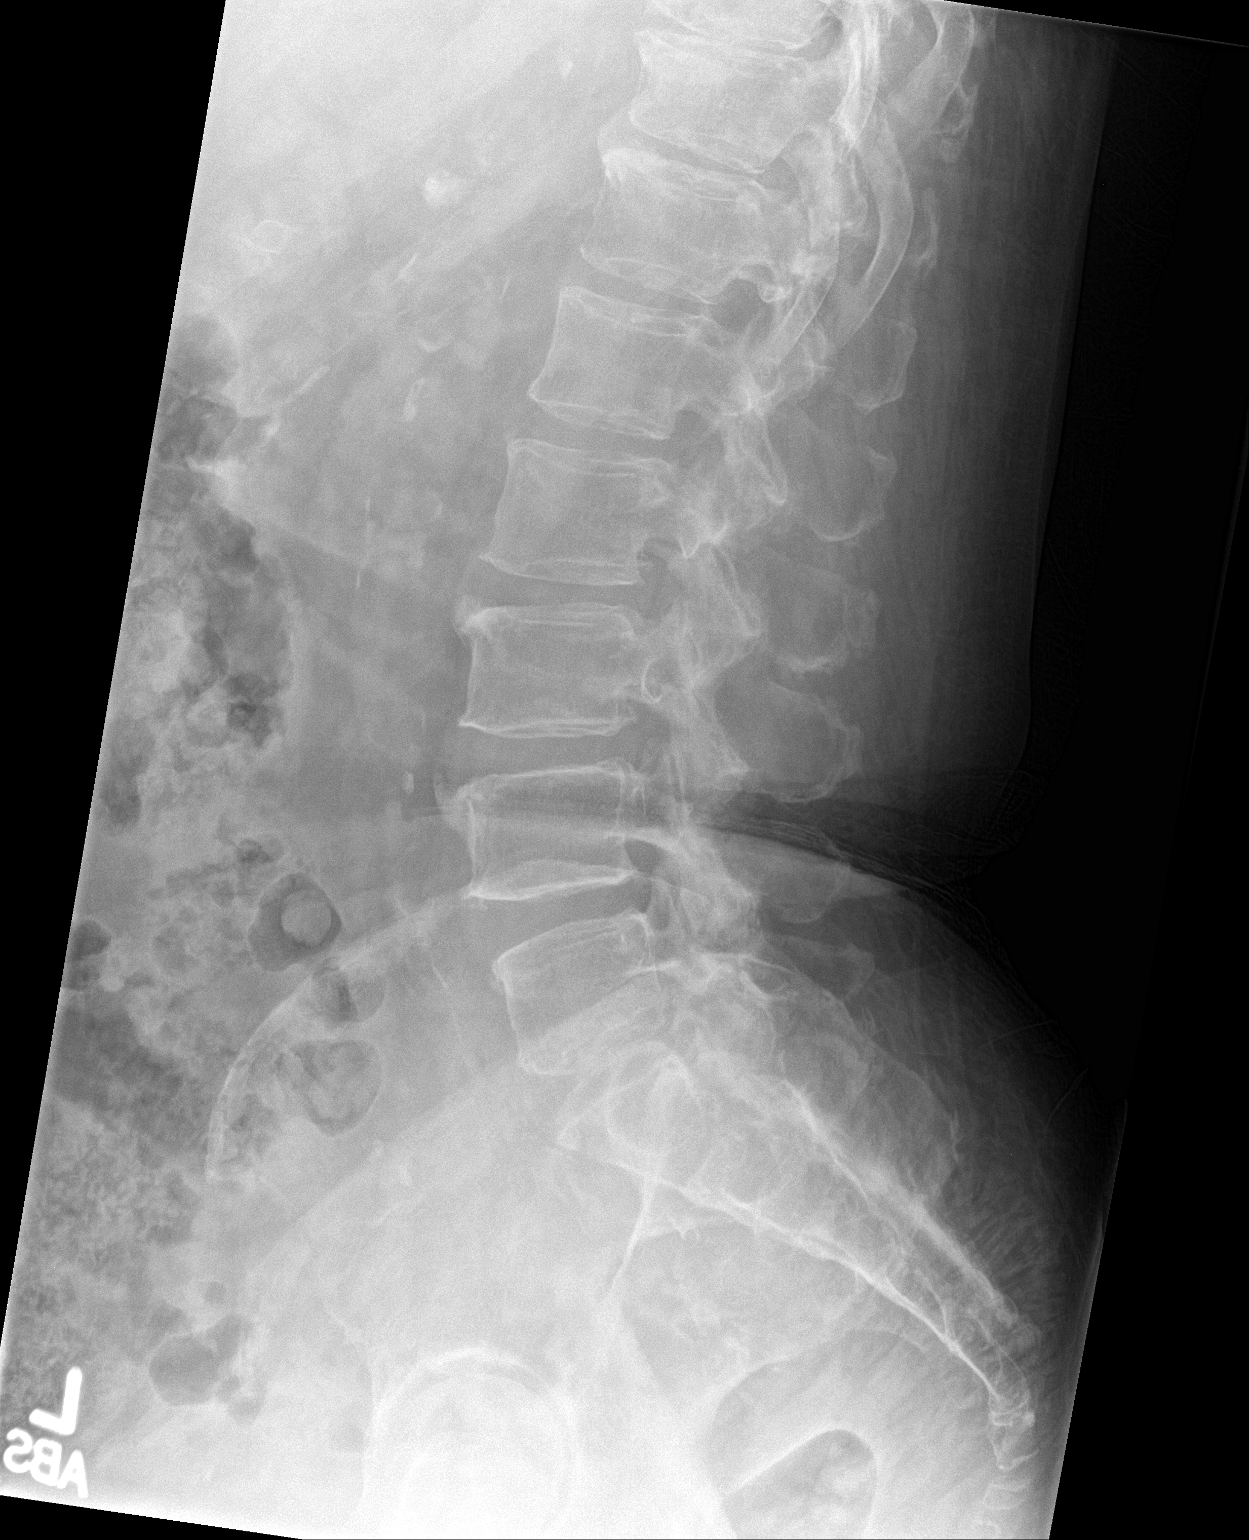

[l-spine spot]
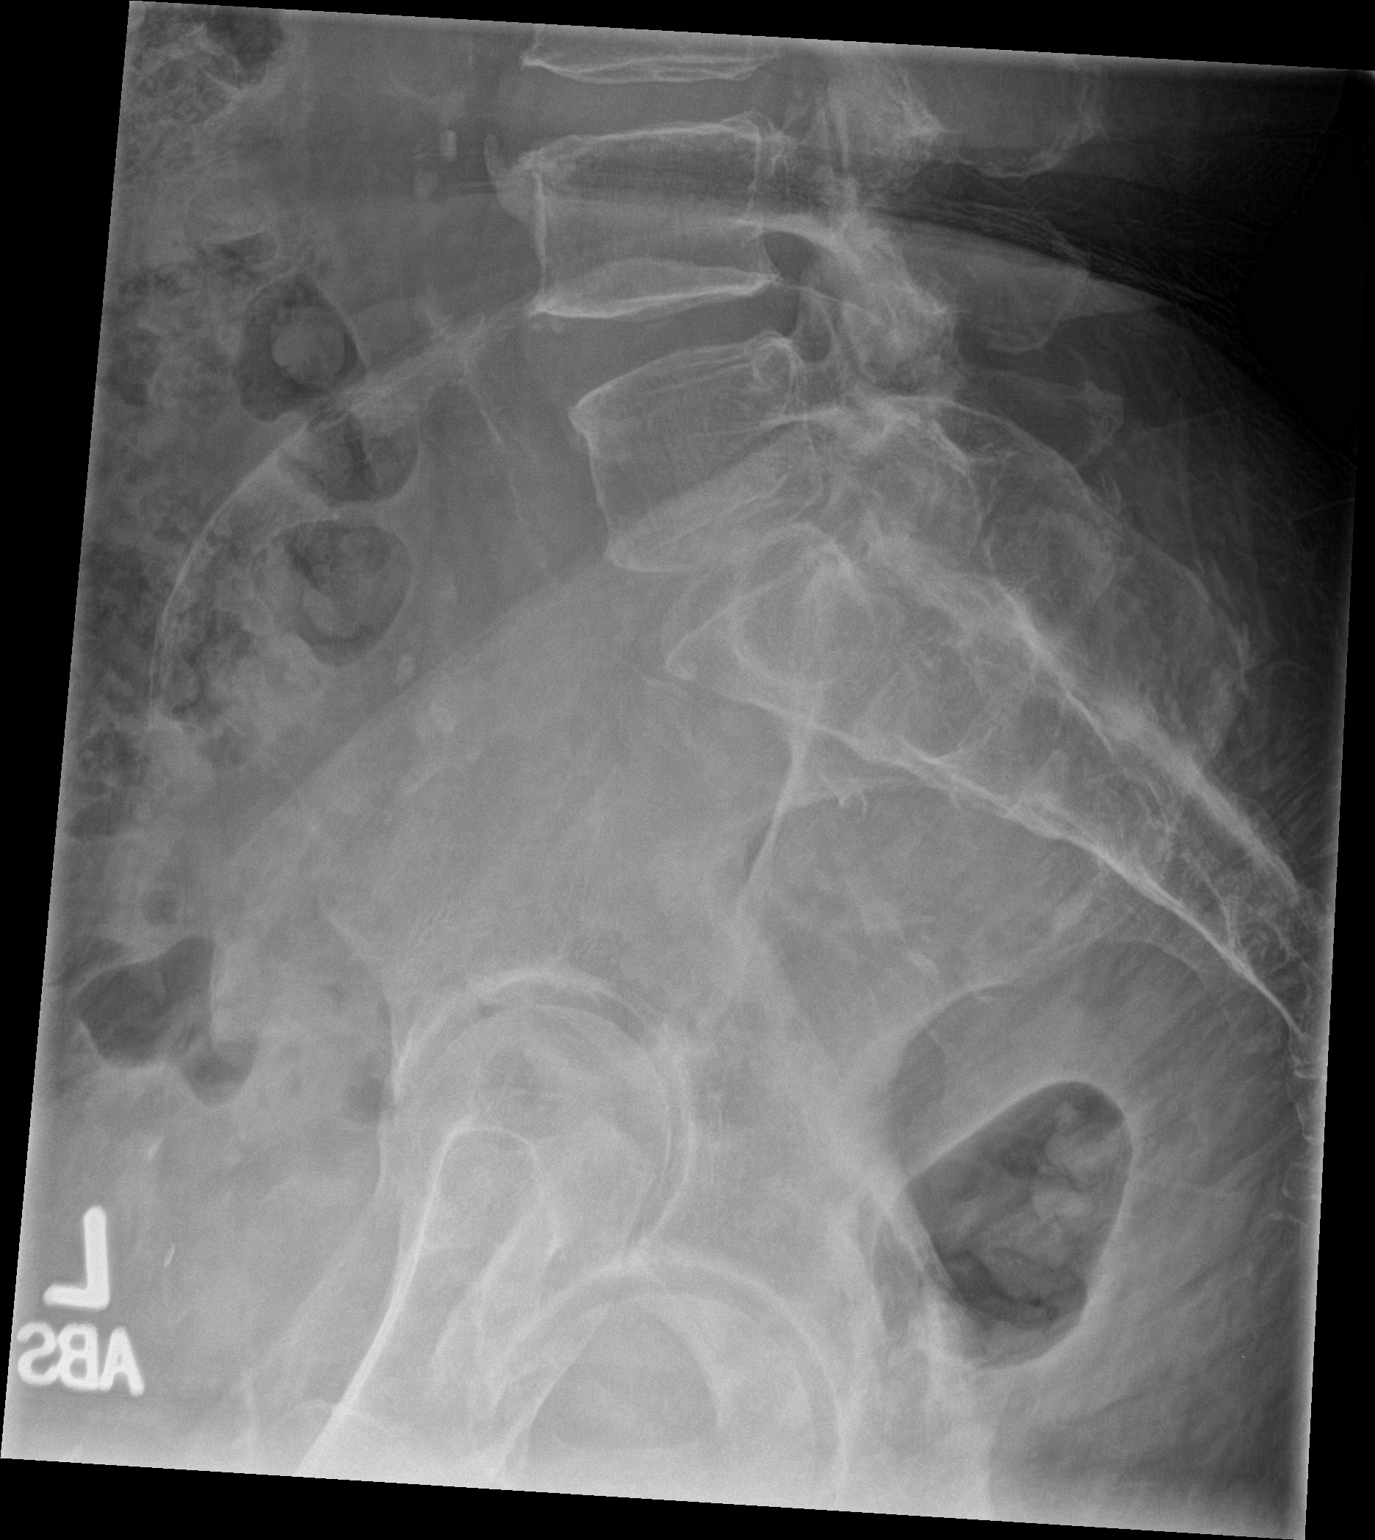

[3 of 3 positions shown; findings below may reference images not displayed]

FINDINGS: Five lumbar type vertebral bodies are well visualized. Vertebral
body height is well maintained. Mild osteophytic changes are seen.
No spondylolisthesis is noted. No soft tissue abnormality is seen.
IMPRESSION: Mild degenerative change without acute abnormality.

## 2017-08-11 IMAGING — CR DG HIP (WITH OR WITHOUT PELVIS) 2-3V*R*
3 series · 3 of 3 positions shown · non-contrast
Comparison: None.

CLINICAL DATA: Recent dizziness with and right hip pain, initial
encounter

EXAM:
DG HIP (WITH OR WITHOUT PELVIS) 2-3V RIGHT

[pelvis ap]
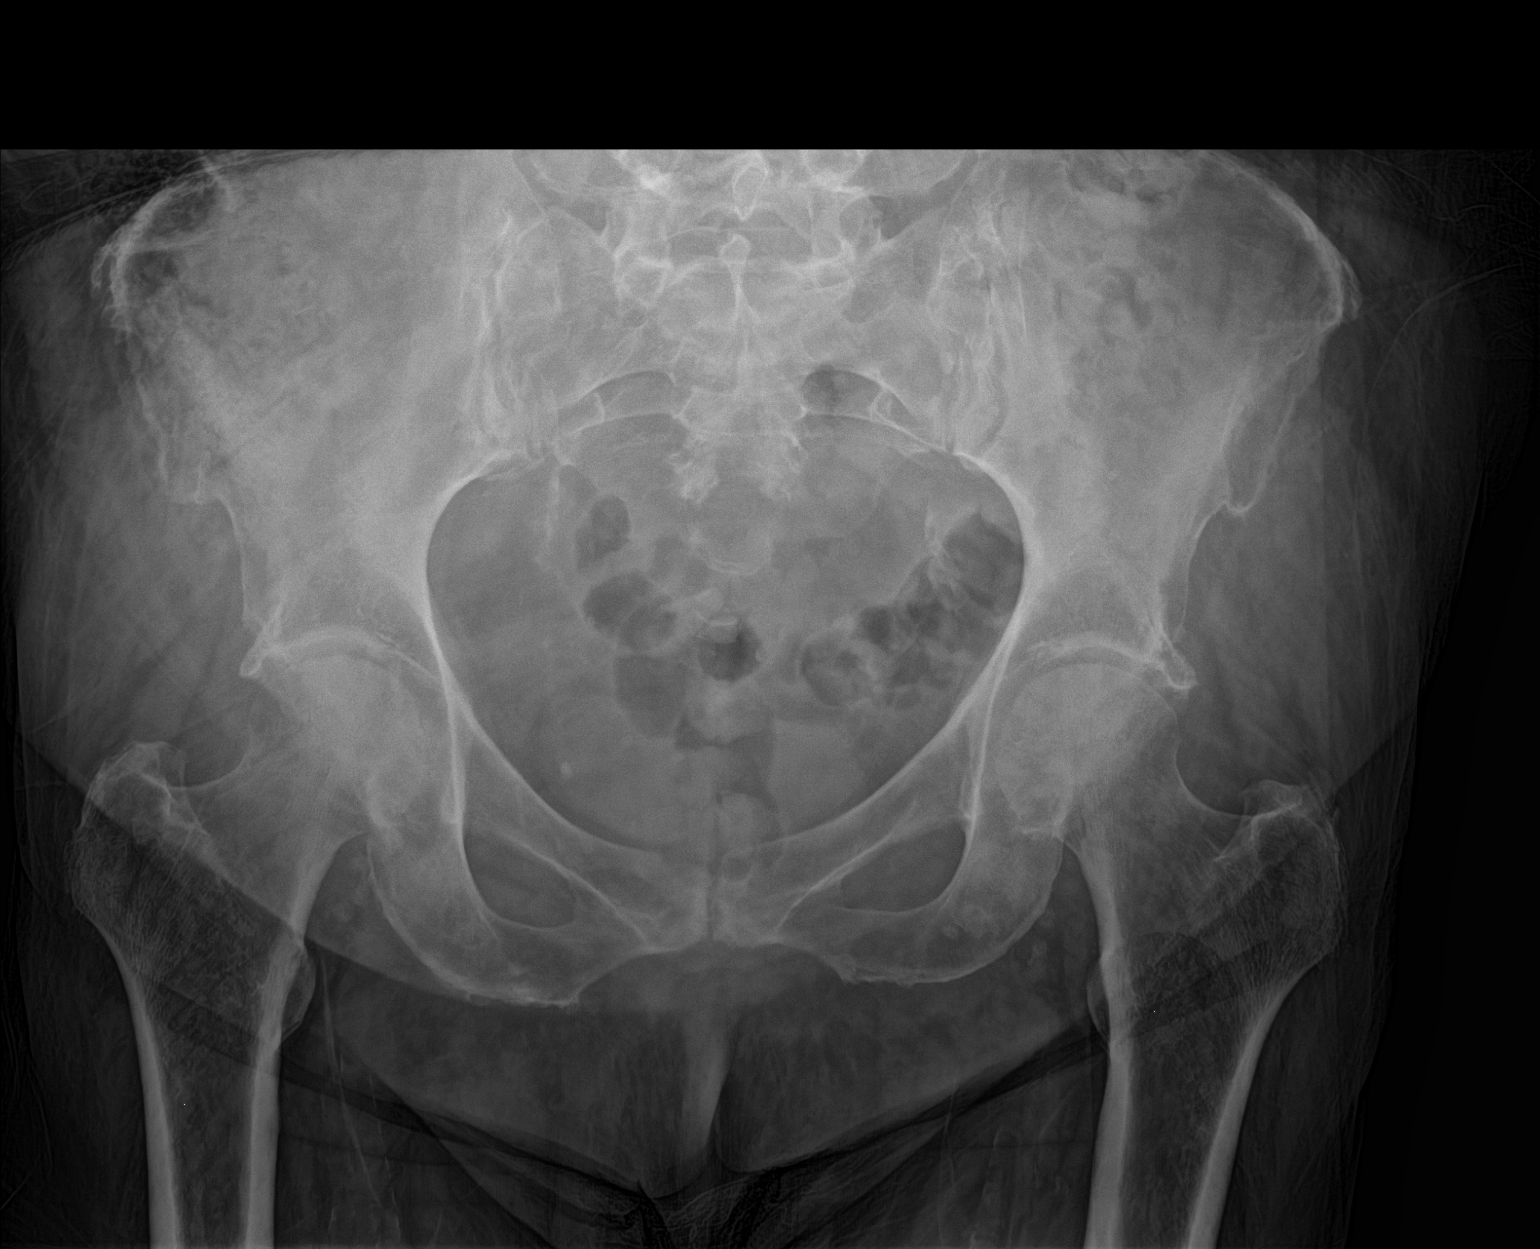

[hip ap]
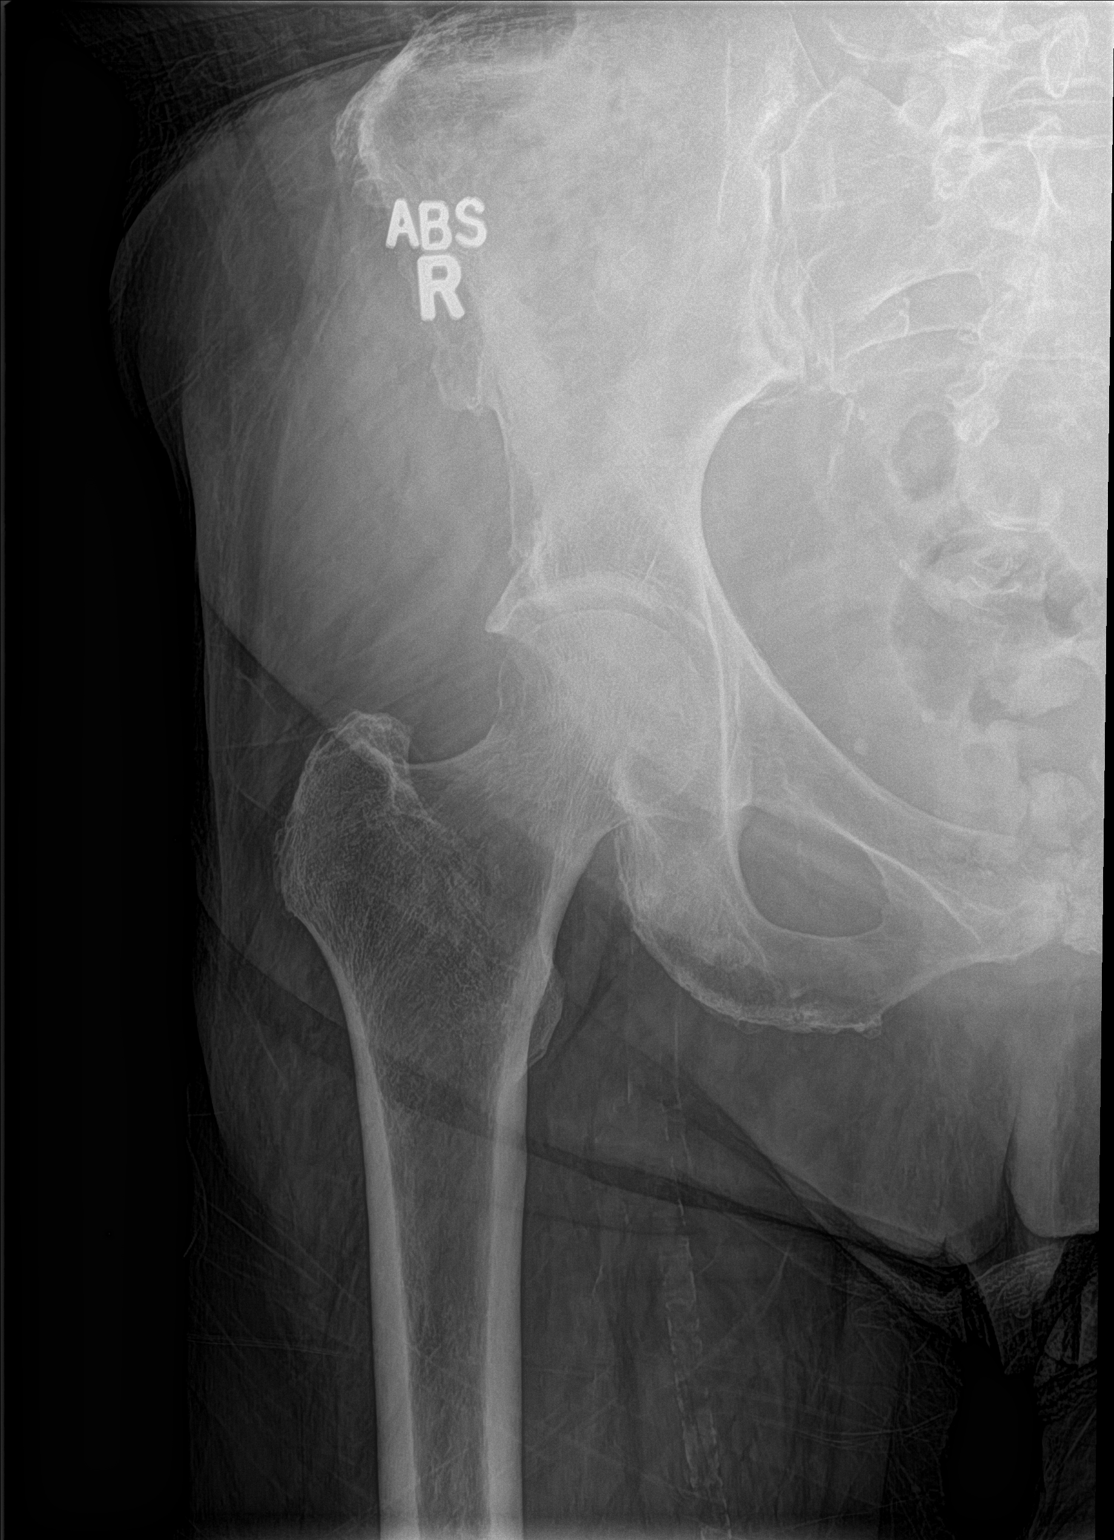

[hip lat]
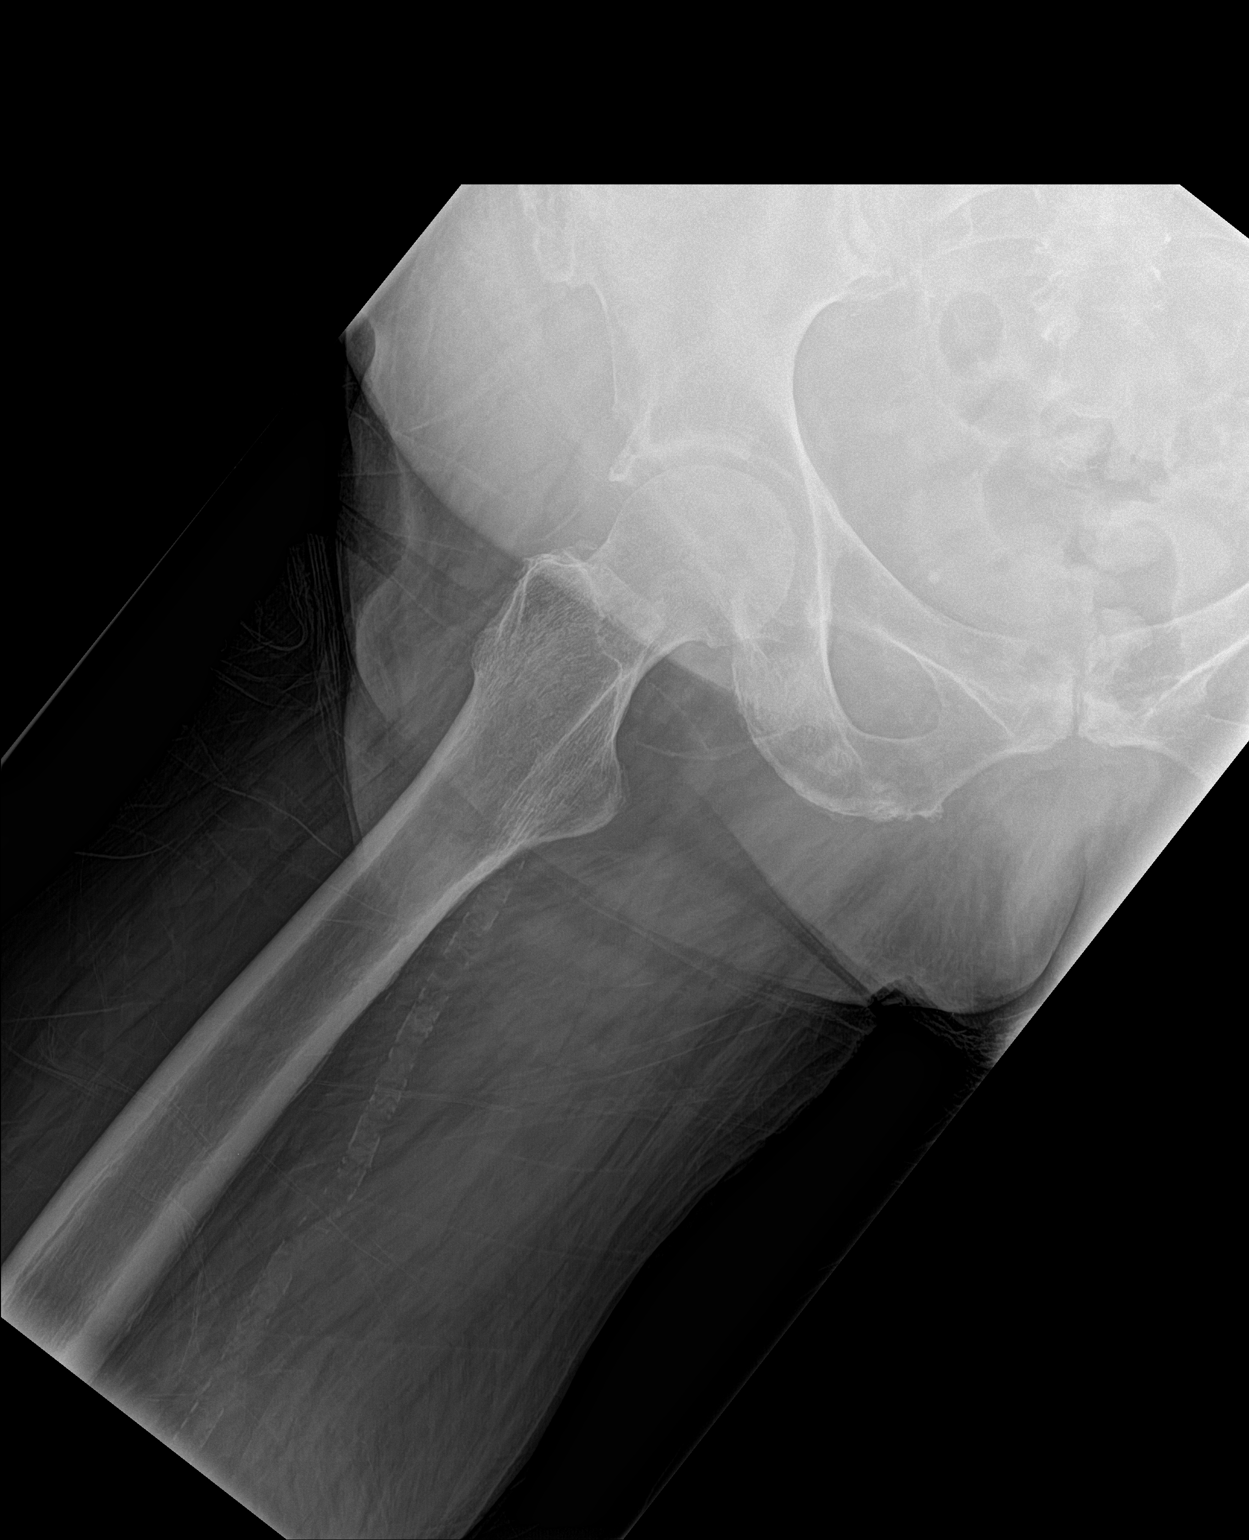

[3 of 3 positions shown; findings below may reference images not displayed]

FINDINGS: The pelvic ring is intact. Degenerative changes of the hip joints
are noted bilaterally. No acute fracture or dislocation is seen. No
gross soft tissue abnormality is noted.
IMPRESSION: No acute abnormality seen.

## 2017-08-11 NOTE — Telephone Encounter (Signed)
Called patient to Pamela Paris, NP's request to follow up on patient after concerning conversation with CMA.   Patient reports she is "hanging in there" but is getting dizzy and lightheaded. Reports fall last Saturday (cleaning floor with slipper, slipped and fell on bottom, did not strike head per patient).   Patient took BP on home monitor over the phone with me: 154/73 mmHg, HR 59 BPM - has not taken meds yet today. Patient reports she is not taking metoprolol tartrate anymore because it made her feel so weak. Reports she is still taking alprazolam daily.  Reports insulin dose is 44 in AM, 55 units PM. Reports she has been taking CBGs once daily and reads the last week to me. Generally, fastings are 70s-90s with 2 hypo events to 65 and 31. Patient states CBG was 78 this morning.   PLAN:  Decreased insulin dose (premixed 75-25 insulin) to 45 units BID. Patient wrote down dose change and verbalized understanding Asked patient to test qHS CBGs in addition to FBGs Recommend d/c alprazolam and DO NOT reinitiate metoprolol with low baseline HR  Will route this note to Pamela Paris, NP and follow up as needed. Patient has appointment scheduled with me on 4/22  [name], [place].D., BCPS PGY2 Ambulatory Care Pharmacy Resident Phone: [phone]

## 2017-08-12 IMAGING — CT CT HEAD W/O CM
2 of 3 series · 16 of 30 positions shown, 19 images · non-contrast
Comparison: November 14, 2013

CLINICAL DATA: Left-sided numbness and tingling

EXAM:
CT HEAD WITHOUT CONTRAST
TECHNIQUE: Contiguous axial images were obtained from the base of the skull
through the vertex without intravenous contrast.

[Series 2: head wo · axial · 0.39mm/px · z∈[+254,+370]mm · 9 of 30 slices shown, 12 images]
[im 3/30  brain]
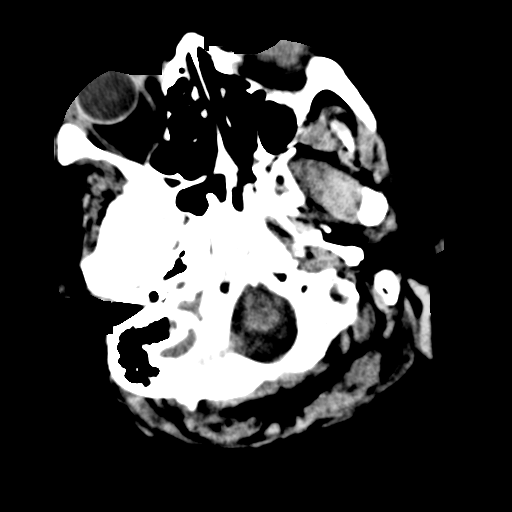
[im 3/30  bone]
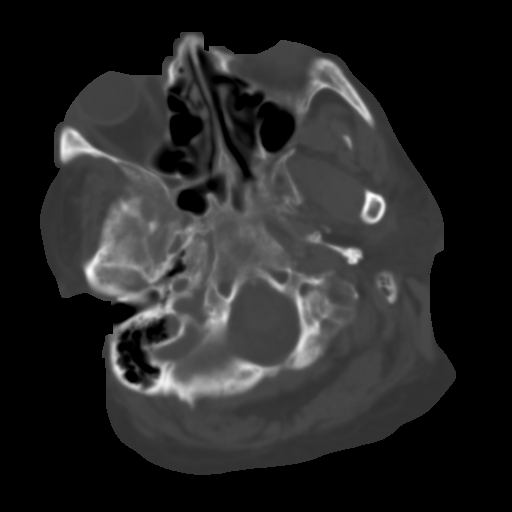
[im 6/30  brain]
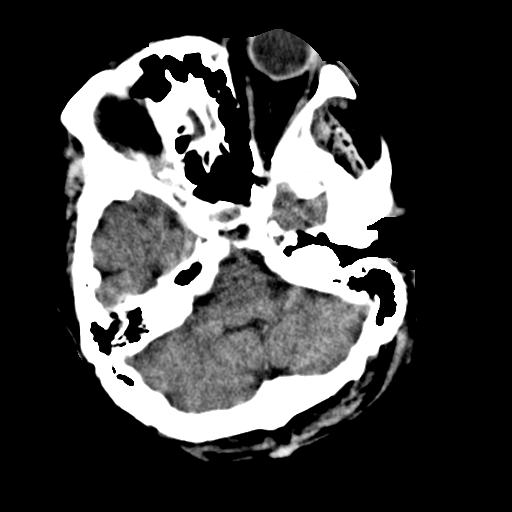
[im 9/30  brain]
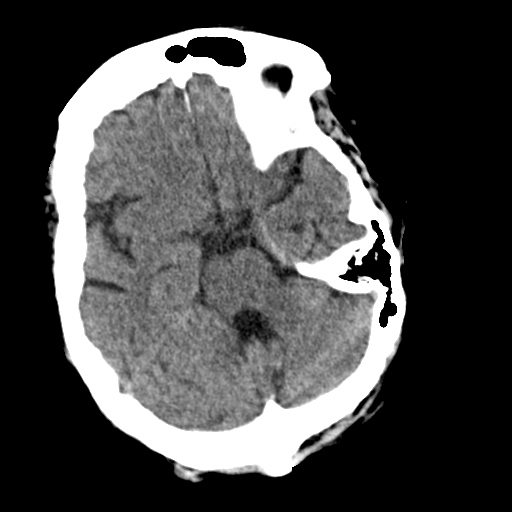
[im 12/30  brain]
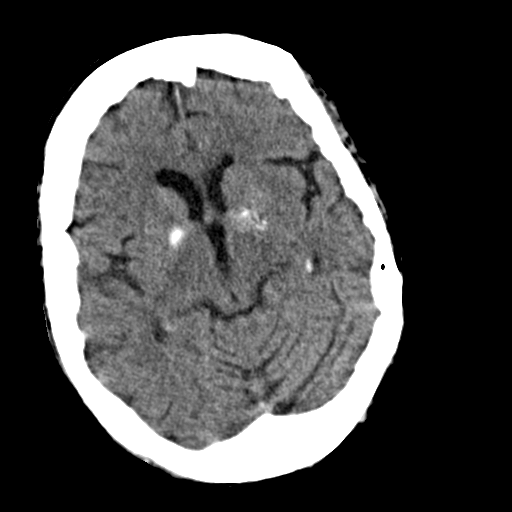
[im 15/30  brain]
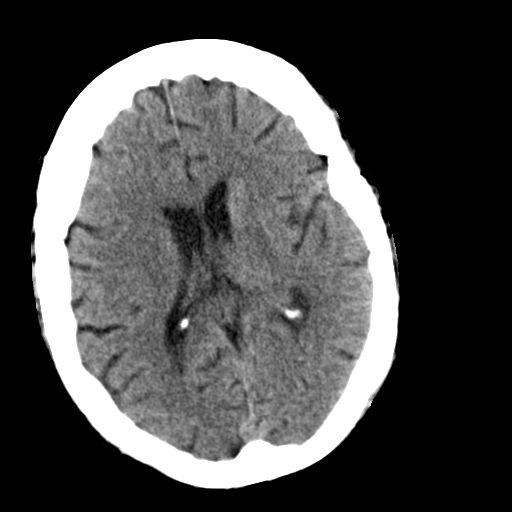
[im 15/30  bone]
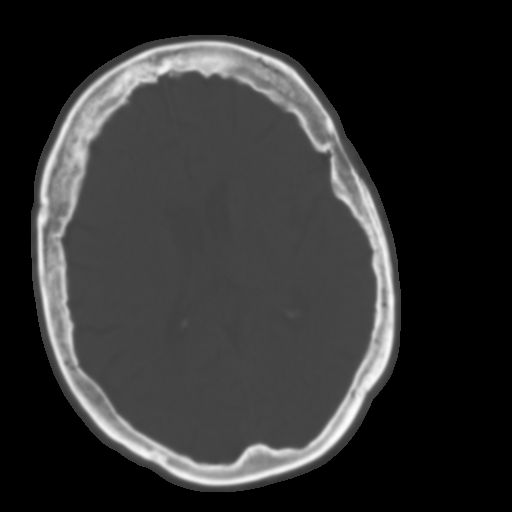
[im 18/30  brain]
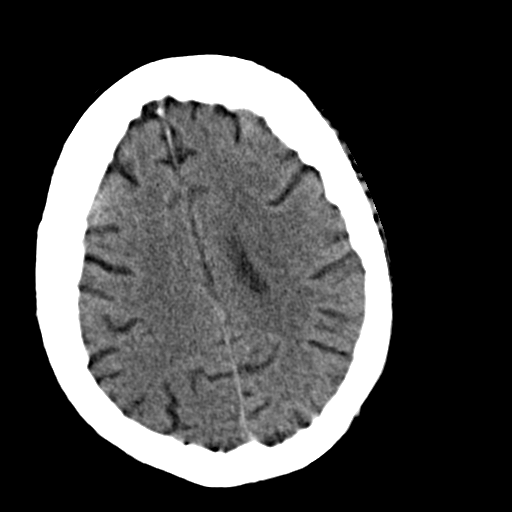
[im 21/30  brain]
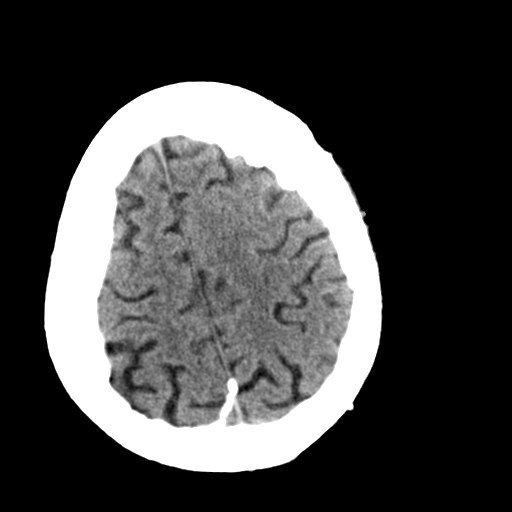
[im 24/30  brain]
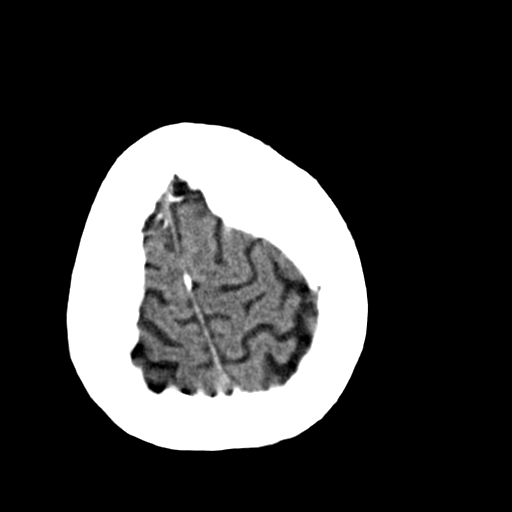
[im 27/30  brain]
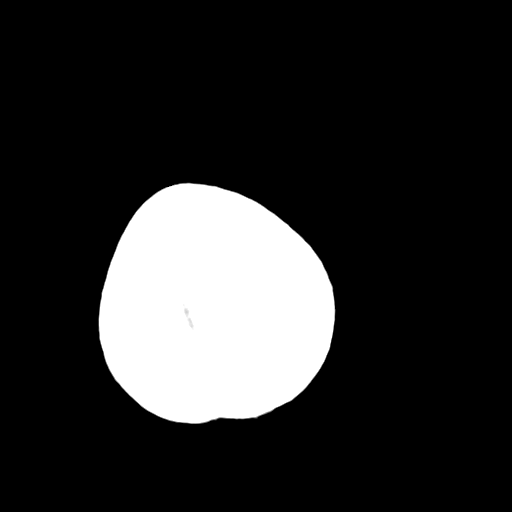
[im 27/30  bone]
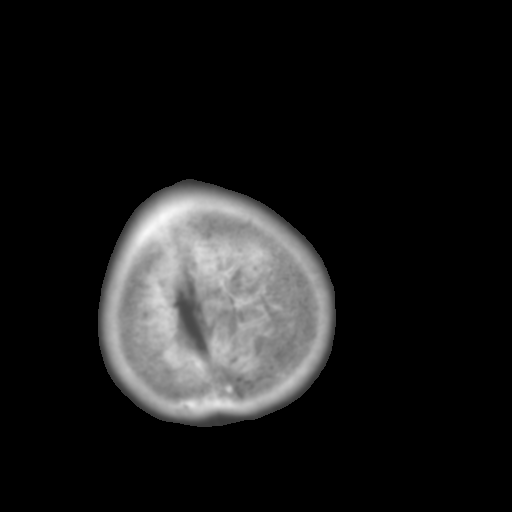

[Series 604: head bone ax · axial · 0.42mm/px · z∈[+281,+365]mm · 7 of 26 slices shown]
[im 4/26  bone]
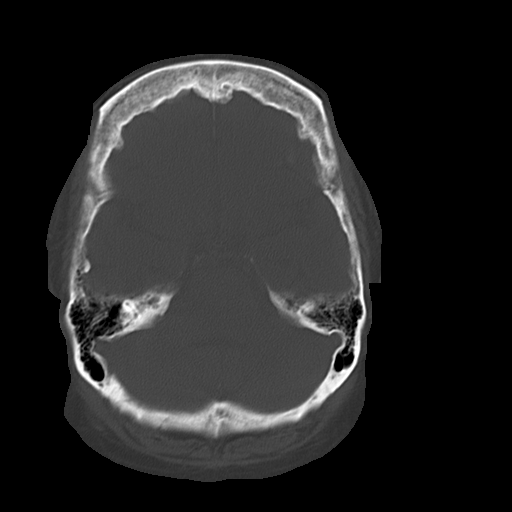
[im 7/26  bone]
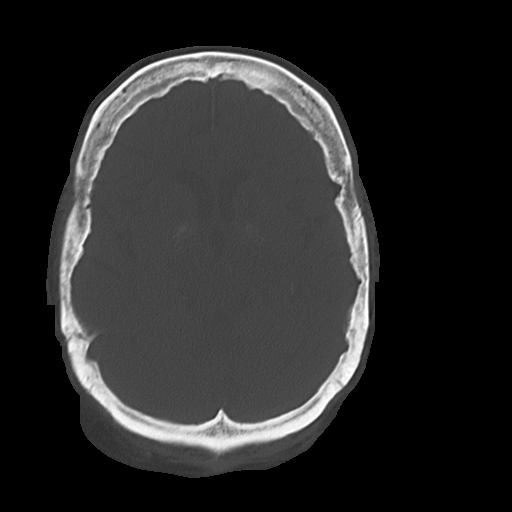
[im 10/26  bone]
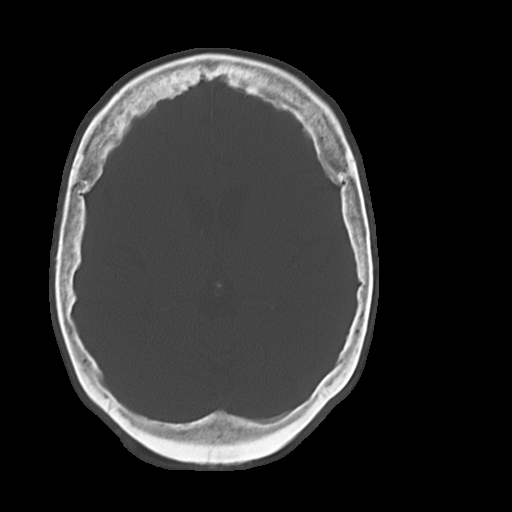
[im 13/26  bone]
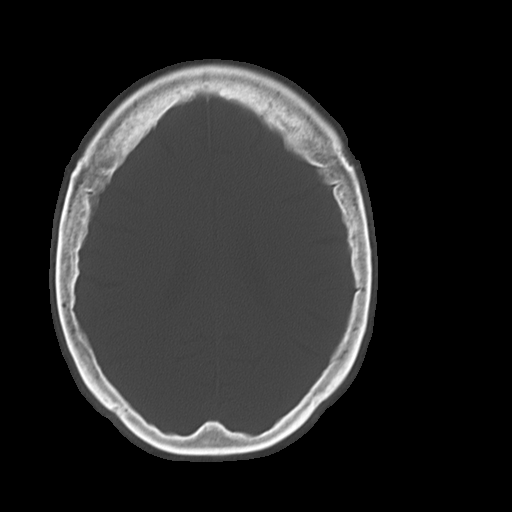
[im 16/26  bone]
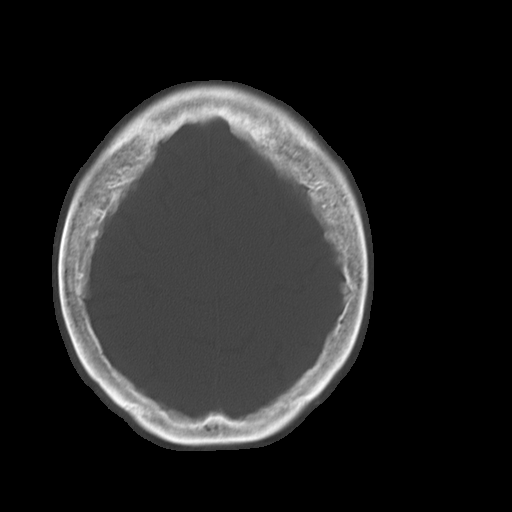
[im 19/26  bone]
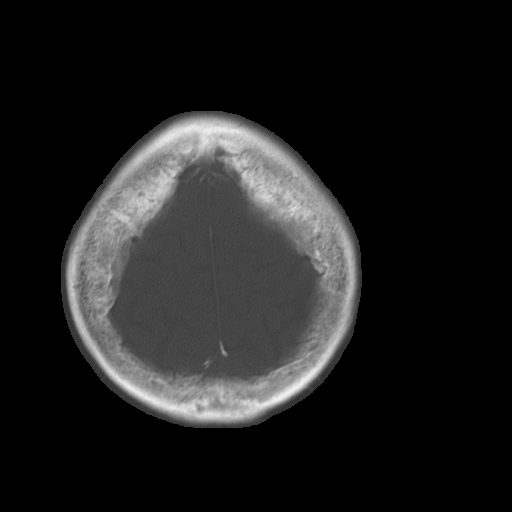
[im 22/26  bone]
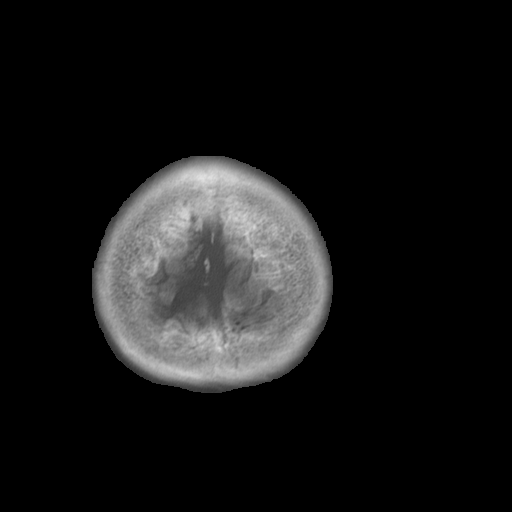

[16 of 30 positions shown; findings below may reference images not displayed]

FINDINGS: The ventricles are normal in size and configuration. There is no
intracranial mass, hemorrhage, extra-axial fluid collection, or
midline shift. Gray-white compartments appear normal. No acute
infarct evident. Basal ganglia calcification is again noted, a
finding most likely physiologic in this age group. Bony calvarium
appears intact. Visualized mastoid air cells are clear. No
intraorbital lesions are identified.
IMPRESSION: No intracranial mass, hemorrhage, or evidence of acute infarct.
Basal ganglia calcification is stable, a finding that is most likely
physiologic in this age group.

## 2017-08-12 IMAGING — CR DG KNEE 1-2V PORT*L*
2 series · 2 of 2 positions shown · non-contrast
Comparison: None.

CLINICAL DATA: Progressive left knee pain.  Fall yesterday.

EXAM:
PORTABLE LEFT KNEE - 1-2 VIEW

[knee ap]
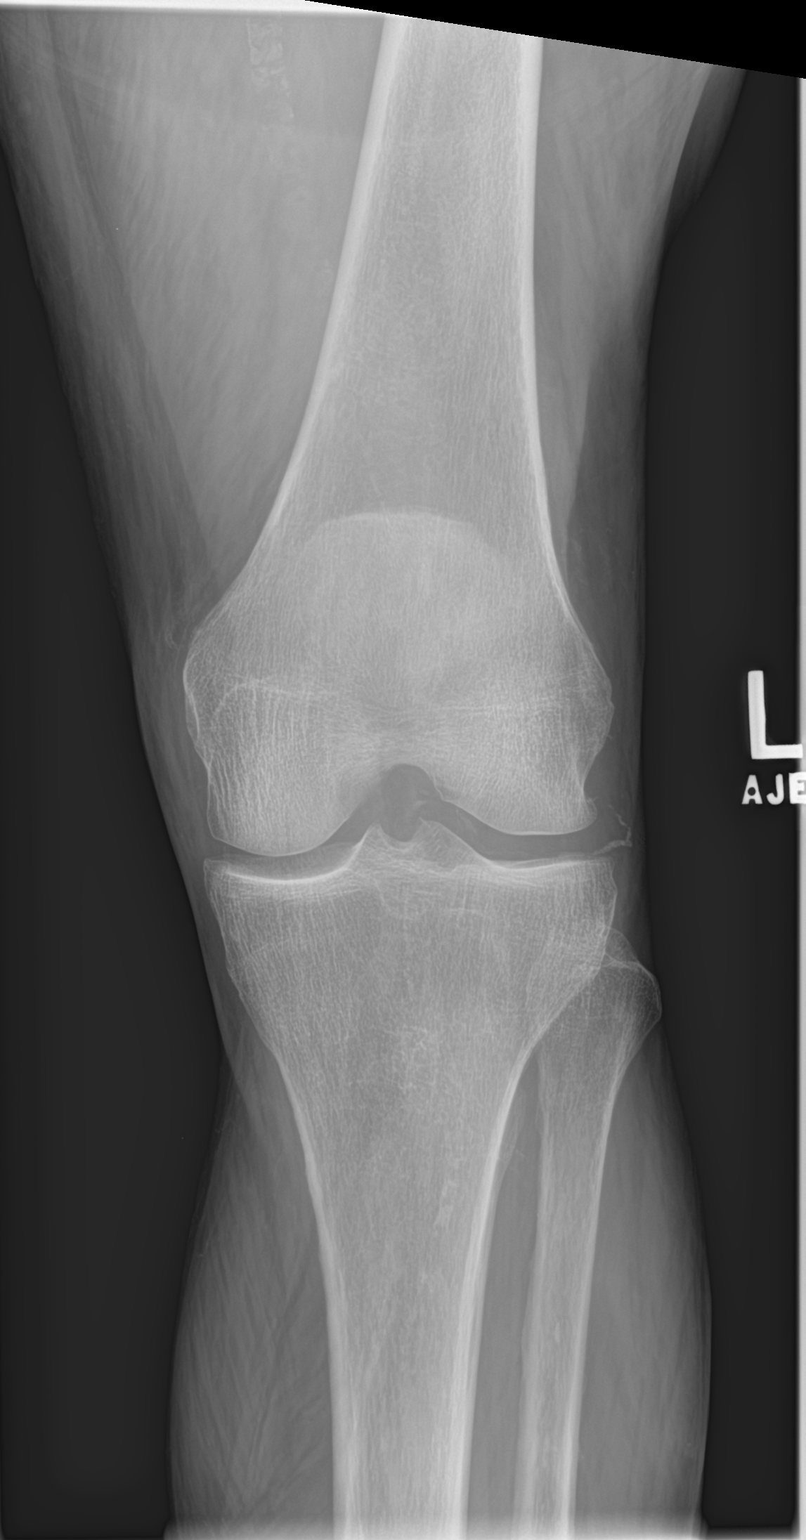

[knee lat]
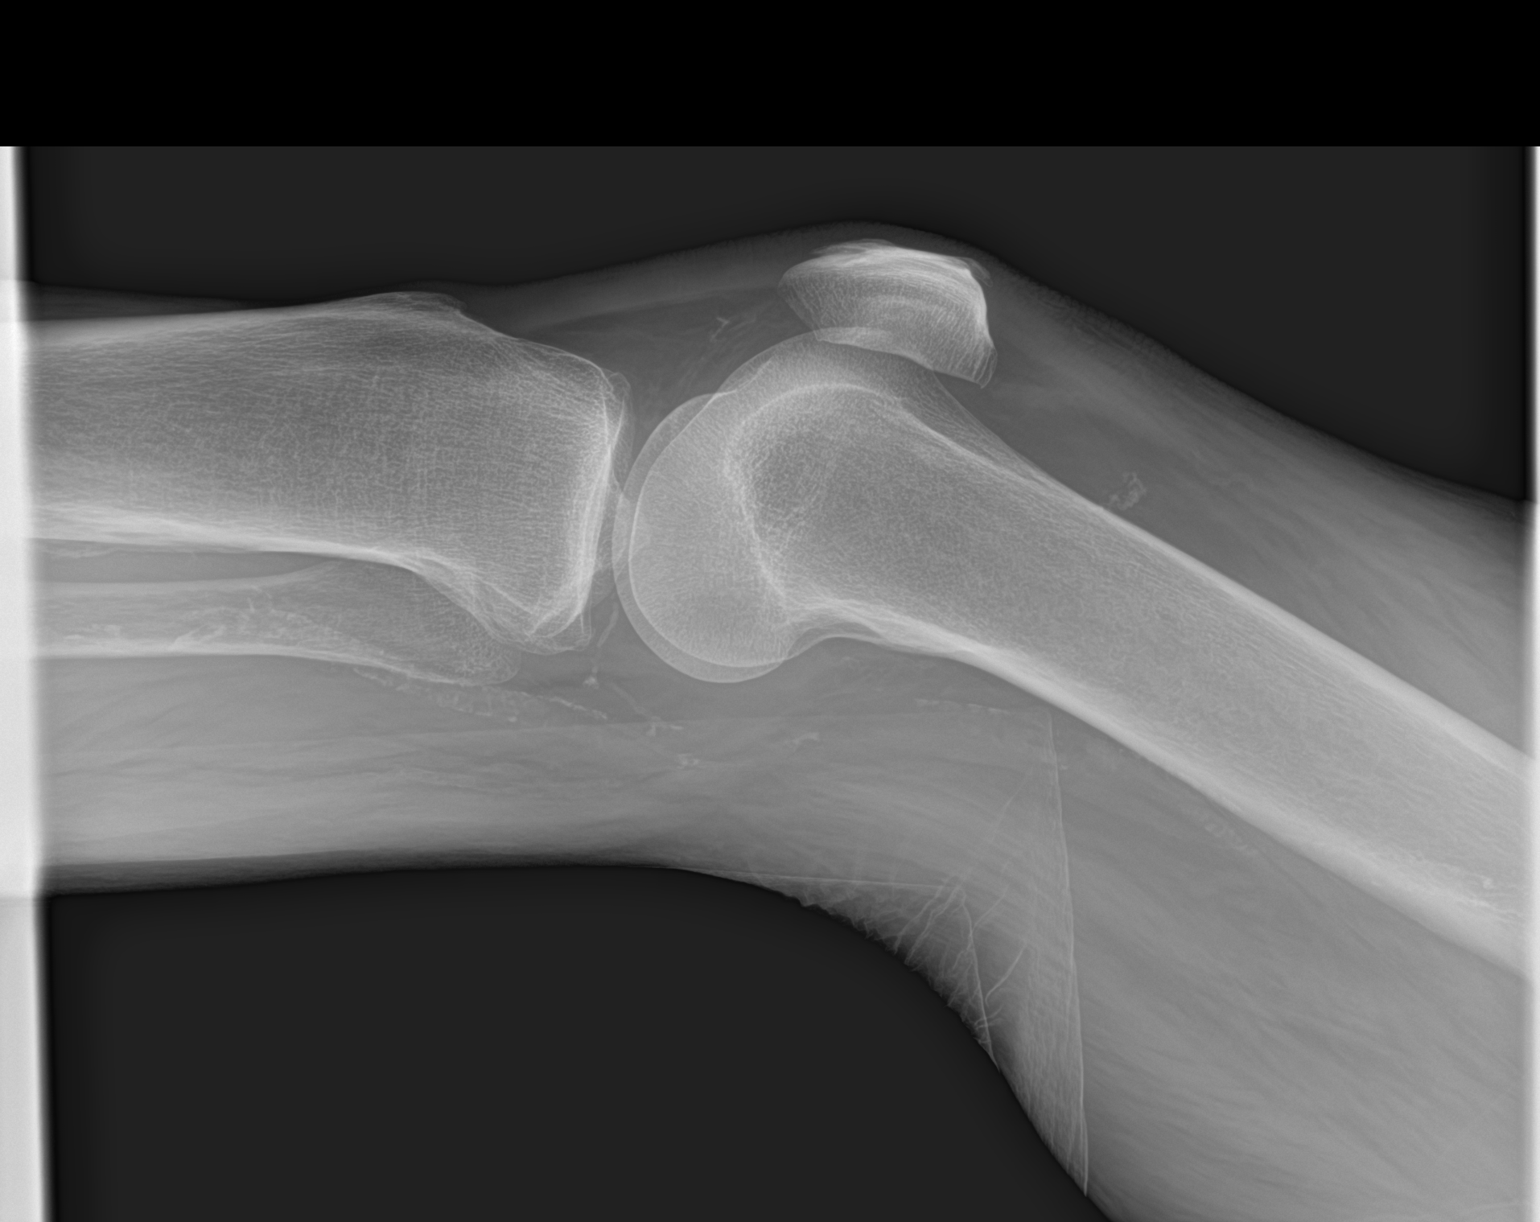

[2 of 2 positions shown; findings below may reference images not displayed]

FINDINGS: Left knee is located. No acute bone or soft tissue abnormality is
present. Joint spaces are preserved. No significant osteophytes are
present. There is no fusion. Extensive vascular calcifications are
present.
IMPRESSION: 1. No acute abnormality.
2. Atherosclerosis.

## 2017-08-12 NOTE — Telephone Encounter (Signed)
Call pt  She is off the metoprolol. How are her BPs?  She needs an appt asap due to falls.    Do I or [facility]  have any openings this week?  She may take my 1:15 on Friday if only option.

## 2017-08-12 NOTE — Telephone Encounter (Signed)
Would you like for me to schedule pt on Friday in your 11:30am or 4:30pm slot? Pt stated that she only wants to see you and those are the only appts I see available.

## 2017-08-12 NOTE — Telephone Encounter (Signed)
Spoke to patient states she is still dizzy, "just doesn't like the way she feels"  Patient states she hasn't fallen since couple of Saturdays ago.  Prefers to see Dr Derrel Nip.   She feels tired and dizzy and doesn't feel like doing anything . She hasn't taken Blood pressure today.  She states she is off medication was told to stop .

## 2017-08-12 NOTE — Telephone Encounter (Signed)
11:30 Friday .  Please obtain orthostatic BP measurements on her when she is roomed

## 2017-08-12 NOTE — Telephone Encounter (Signed)
Can you please schedule pt for Friday 08/14/2017 @ 11:30am with Dr. Derrel Nip for dizziness. Pt is aware of appt date and time.

## 2017-08-13 ENCOUNTER — Encounter: Payer: Self-pay | Admitting: *Deleted

## 2017-08-13 ENCOUNTER — Other Ambulatory Visit: Payer: Self-pay | Admitting: *Deleted

## 2017-08-13 NOTE — Patient Outreach (Signed)
[facility]) Care Management  08/13/2017  Pamela Collier 24-Jul-1940 [id number]  Telephone screening call   Referral received : 08/11/17 Referral reason : Falls, polypharmacy Referral source: [place] : Medicare    Patient is  followed by Gray Bernhardt, [facility] Pharmacist   Placed call to patient,no answer unable to leave a voicemail message as message states memory is full.   Plan Will plan next telephone outreach in the next 3 to 4 business days  by RN, Care coordinator coverage.  Unsuccessful outreach letter sent .   [name], RN, [place] Management Coordinator  [phone]- Mobile [phone]- Toll Free Main Office

## 2017-08-14 ENCOUNTER — Encounter: Payer: Self-pay | Admitting: Internal Medicine

## 2017-08-14 ENCOUNTER — Ambulatory Visit (INDEPENDENT_AMBULATORY_CARE_PROVIDER_SITE_OTHER): Payer: Medicare Other

## 2017-08-14 ENCOUNTER — Ambulatory Visit (INDEPENDENT_AMBULATORY_CARE_PROVIDER_SITE_OTHER): Payer: Medicare Other | Admitting: Internal Medicine

## 2017-08-14 VITALS — BP 128/52 | HR 62 | Temp 98.2°F | Resp 15 | Ht 67.0 in | Wt 185.6 lb

## 2017-08-14 DIAGNOSIS — M7918 Myalgia, other site: Secondary | ICD-10-CM | POA: Diagnosis not present

## 2017-08-14 DIAGNOSIS — I255 Ischemic cardiomyopathy: Secondary | ICD-10-CM | POA: Diagnosis not present

## 2017-08-14 DIAGNOSIS — Z780 Asymptomatic menopausal state: Secondary | ICD-10-CM

## 2017-08-14 DIAGNOSIS — I129 Hypertensive chronic kidney disease with stage 1 through stage 4 chronic kidney disease, or unspecified chronic kidney disease: Secondary | ICD-10-CM

## 2017-08-14 DIAGNOSIS — R4189 Other symptoms and signs involving cognitive functions and awareness: Secondary | ICD-10-CM | POA: Diagnosis not present

## 2017-08-14 DIAGNOSIS — N183 Chronic kidney disease, stage 3 (moderate): Secondary | ICD-10-CM

## 2017-08-14 DIAGNOSIS — Z9181 History of falling: Secondary | ICD-10-CM | POA: Diagnosis not present

## 2017-08-14 DIAGNOSIS — R52 Pain, unspecified: Secondary | ICD-10-CM | POA: Diagnosis not present

## 2017-08-14 DIAGNOSIS — E1122 Type 2 diabetes mellitus with diabetic chronic kidney disease: Secondary | ICD-10-CM

## 2017-08-14 DIAGNOSIS — R1312 Dysphagia, oropharyngeal phase: Secondary | ICD-10-CM | POA: Diagnosis not present

## 2017-08-14 DIAGNOSIS — S79911A Unspecified injury of right hip, initial encounter: Secondary | ICD-10-CM | POA: Diagnosis not present

## 2017-08-14 MED ORDER — OMEPRAZOLE 40 MG PO CPDR: 40.0000 mg | DELAYED_RELEASE_CAPSULE | Freq: Every day | ORAL | 3 refills | Status: DC

## 2017-08-14 NOTE — Progress Notes (Signed)
Subjective:  Patient ID: Pamela Collier, female    DOB: Mar 08, 1941  Age: 77 y.o. MRN: [id number]  CC: The primary encounter diagnosis was Right buttock pain. Diagnoses of Cognitive decline, Oropharyngeal dysphagia, Type 2 DM with CKD stage 3 and hypertension ([place]), History of fall, and Asymptomatic postmenopausal estrogen deficiency were also pertinent to this visit.  HPI Pamela Collier presents for several issues   1) recurrent low BS :   Taking 45 units of mixed insulin in the am and 45 in the evening,  Insists that the dose was changed from what is in the chart   (45 and 55 ) . Blood sugar log reviewed, but patient is confused about her own  Comments recorded .  it appears that her sugars   In the morning are low and have dropped to 39 on one occasion.    The second sugar each day (she is not sure when she was taking them ) is 120 to 170      2) Recurrent dizziness, loss of balance. Now using a walker.    Had a fall 3 weeks ago in the kitchen, occurred while mopping the floor,  Fell onto right side. Still having back pain  And right buttock pain.  Not orthostatic . Symptoms of dizziness improved  with suspension of protonix 4 days ago.  Feels that the protonix also gave her a stomach ache and trouble swallowing food unless her food was well chewed  3) Increasing confusion and loss of memory . Became confused about who is coming out to the house. Multiple agencies have either come out or tried to contact her.   Said no to the most recent representative,  Not sure who they were from . Says that the RN from [place] came out a few times but would not be back.  Not sure why      Outpatient Medications Prior to Visit  Medication Sig Dispense Refill  . acetaminophen (TYLENOL) 500 MG tablet Take 1,000 mg by mouth every 6 (six) hours as needed for mild pain or headache.     . ALPRAZolam (XANAX) 0.25 MG tablet TAKE 1 TABLET BY MOUTH ONCE DAILY 30 tablet 2  . aspirin 81 MG chewable tablet Chew by  mouth daily.    . B-D UF III MINI PEN NEEDLES 31G X 5 MM MISC USE THREE TIMES A DAY 270 each 3  . BRILINTA 90 MG TABS tablet Take 1 tablet (90 mg total) by mouth 2 (two) times daily. 180 tablet 0  . budesonide-formoterol (SYMBICORT) 160-4.5 MCG/ACT inhaler Inhale 2 puffs into the lungs 2 (two) times daily. 1 Inhaler 0  . docusate sodium (COLACE) 100 MG capsule Take 100 mg by mouth 2 (two) times daily as needed for mild constipation. Reported on 11/09/2015    . feeding supplement, GLUCERNA SHAKE, (GLUCERNA SHAKE) LIQD Take 237 mLs by mouth 2 (two) times daily between meals. 60 Can 0  . fluticasone (FLONASE) 50 MCG/ACT nasal spray Place 2 sprays into both nostrils daily as needed for rhinitis.    . furosemide (LASIX) 20 MG tablet Take 1 tablet (20 mg total) by mouth every other day. 30 tablet 0  . glucose blood (ACCU-CHEK AVIVA PLUS) test strip USE TO CHECK GLUCOSE  THREE TIMES DAILY 300 each 3  . isosorbide mononitrate (IMDUR) 30 MG 24 hr tablet TAKE 1 TABLET DAILY 90 tablet 1  . losartan (COZAAR) 100 MG tablet TAKE 1 TABLET DAILY 90 tablet 1  .  LYRICA 100 MG capsule TAKE 1 CAPSULE BY MOUTH THREE TIMES DAILY (Patient taking differently: Take 1 capsule 2 times daily) 90 capsule 5  . metoprolol succinate (TOPROL-XL) 25 MG 24 hr tablet Take 1 tablet (25 mg total) by mouth daily. 90 tablet 0  . nitroGLYCERIN (NITROLINGUAL) 0.4 MG/SPRAY spray Place 1 spray under the tongue every 5 (five) minutes x 3 doses as needed for chest pain. 12 g 12  . ondansetron (ZOFRAN) 4 MG tablet Take 1 tablet (4 mg total) by mouth every 8 (eight) hours as needed for nausea or vomiting. 20 tablet 0  . pantoprazole (PROTONIX) 40 MG tablet Take 1 tablet (40 mg total) by mouth 2 (two) times daily. 60 tablet 1  . PARoxetine (PAXIL) 20 MG tablet TAKE 1 TABLET BY MOUTH ONCE DAILY 90 tablet 1  . polyethylene glycol (MIRALAX / GLYCOLAX) packet Take 17 g by mouth daily as needed for mild constipation. Reported on 10/25/2015    .  rosuvastatin (CRESTOR) 20 MG tablet TAKE ONE TABLET BY MOUTH ONCE DAILY 90 tablet 1  . Insulin Lispro Prot & Lispro (HUMALOG MIX 75/25 KWIKPEN) (75-25) 100 UNIT/ML Kwikpen Inject 44 units in the morning and 55 units in the evening 15 mL 11   Facility-Administered Medications Prior to Visit  Medication Dose Route Frequency Provider Last Rate Last Dose  . albuterol (PROVENTIL) (5 MG/ML) 0.5% nebulizer solution 2.5 mg  2.5 mg Nebulization Once [name], MD        Review of Systems;  Patient denies headache, fevers, malaise, unintentional weight loss, skin rash, eye pain, sinus congestion and sinus pain, sore throat, dysphagia,  hemoptysis , cough, dyspnea, wheezing, chest pain, palpitations, orthopnea, edema, abdominal pain, nausea, melena, diarrhea, constipation, flank pain, dysuria, hematuria, urinary  Frequency, nocturia, numbness, tingling, seizures,  Focal weakness, Loss of consciousness,  Tremor, insomnia, depression, anxiety, and suicidal ideation.      Objective:  BP (!) 128/52 (BP Location: Left Arm, Patient Position: Sitting, Cuff Size: Normal)   Pulse 62   Temp 98.2 F (36.8 C) (Oral)   Resp 15   Ht 5\' 7"  (1.702 m)   Wt 185 lb 9.6 oz (84.2 kg)   SpO2 98%   BMI 29.07 kg/m   BP Readings from Last 3 Encounters:  08/14/17 (!) 128/52  08/03/17 121/71  07/28/17 140/62    Wt Readings from Last 3 Encounters:  08/14/17 185 lb 9.6 oz (84.2 kg)  08/03/17 188 lb (85.3 kg)  07/28/17 184 lb 3.2 oz (83.6 kg)    General appearance: alert, cooperative and appears stated age Ears: normal TM's and external ear canals both ears Throat: lips, mucosa, and tongue normal; teeth and gums normal Neck: no adenopathy, no carotid bruit, supple, symmetrical, trachea midline and thyroid not enlarged, symmetric, no tenderness/mass/nodules Back: symmetric, no curvature. ROM normal. No CVA tenderness. Lungs: clear to auscultation bilaterally Heart: regular rate and rhythm, S1, S2 normal, no  murmur, click, rub or gallop Abdomen: soft, non-tender; bowel sounds normal; no masses,  no organomegaly Pulses: 2+ and symmetric Skin: Skin color, texture, turgor normal. No rashes or lesions Lymph nodes: Cervical, supraclavicular, and axillary nodes normal.  Lab Results  Component Value Date   HGBA1C 7.5 (H) 06/08/2017   HGBA1C 7.5 (H) 10/08/2016   HGBA1C 7.1 06/18/2016    Lab Results  Component Value Date   CREATININE 1.48 (H) 06/08/2017   CREATININE 1.35 (H) 06/04/2017   CREATININE 1.54 (H) 06/03/2017    Lab Results  Component  Value Date   WBC 5.9 06/04/2017   HGB 11.8 (L) 06/04/2017   HCT 35.7 06/04/2017   PLT 239 06/04/2017   GLUCOSE 170 (H) 06/08/2017   CHOL 124 11/15/2016   TRIG 175 (H) 11/15/2016   HDL 29 (L) 11/15/2016   LDLDIRECT 102.0 03/14/2016   LDLCALC 60 11/15/2016   ALT 9 06/08/2017   AST 15 06/08/2017   NA 140 06/08/2017   K 3.9 06/08/2017   CL 104 06/08/2017   CREATININE 1.48 (H) 06/08/2017   BUN 27 (H) 06/08/2017   CO2 29 06/08/2017   TSH 0.953 11/15/2016   INR 1.2 (H) 02/02/2015   HGBA1C 7.5 (H) 06/08/2017   MICROALBUR 28.9 (H) 09/26/2015      Assessment & Plan:   Problem List Items Addressed This Visit    Type 2 DM with CKD stage 3 and hypertension ([place])    She has been taking 45 units of mixed insulin twice daily since her meeting with Gray Bernhardt and is having recurrent hypoglycemic episodes in the morning.  Reducing evening dose to 40 units today       Relevant Medications   Insulin Lispro Prot & Lispro (HUMALOG MIX 75/25 KWIKPEN) (75-25) 100 UNIT/ML Kwikpen   History of fall    Her most recent fall occurred 3 weeks ago while mopping her kitchen floor.  hip and pelvic films were done today due to persistent right hip and low back pain. [name] No fractures.  Needs DEXA.  Needs Home PT advised.       Dysphagia    She has not tolerated protonix ,  Change to omeprazole today       Cognitive decline    Neurology evaluation advised  and accepted for formal assessment given her increasing confusion       Relevant Orders   Ambulatory referral to Neurology    Other Visit Diagnoses    Right buttock pain    -  Primary   Relevant Orders   DG HIP UNILAT WITH PELVIS 2-3 VIEWS RIGHT (Completed)   Asymptomatic postmenopausal estrogen deficiency       Relevant Orders   DG Bone Density      I have changed Pamela Collier's Insulin Lispro Prot & Lispro. I am also having her start on omeprazole. Additionally, I am having her maintain her budesonide-formoterol, acetaminophen, docusate sodium, fluticasone, polyethylene glycol, glucose blood, ondansetron, nitroGLYCERIN, BRILINTA, B-D UF III MINI PEN NEEDLES, isosorbide mononitrate, feeding supplement (GLUCERNA SHAKE), LYRICA, furosemide, pantoprazole, rosuvastatin, losartan, PARoxetine, ALPRAZolam, aspirin, and metoprolol succinate.  Meds ordered this encounter  Medications  . omeprazole (PRILOSEC) 40 MG capsule    Sig: Take 1 capsule (40 mg total) by mouth daily.    Dispense:  30 capsule    Refill:  3  . Insulin Lispro Prot & Lispro (HUMALOG MIX 75/25 KWIKPEN) (75-25) 100 UNIT/ML Kwikpen    Sig: Inject 45 units in the morning and 40 units in the evening    Dispense:  15 mL    Refill:  11    Medications Discontinued During This Encounter  Medication Reason  . Insulin Lispro Prot & Lispro (HUMALOG MIX 75/25 KWIKPEN) (75-25) 100 UNIT/ML Kwikpen     Follow-up: No follow-ups on file.   [name], MD

## 2017-08-14 NOTE — Telephone Encounter (Signed)
Pt was seen by Dr. Derrel Nip this morning.

## 2017-08-14 NOTE — Telephone Encounter (Signed)
Please call pt out of courtesy  Please ensure she a f/u on books with [facility] to discuss falls , medicaitons etc

## 2017-08-14 NOTE — Patient Instructions (Addendum)
Reduce the evening dose of insulin to 40 units before supper,  And drink an evening snack   I have prescribed a New prescription for omeprazole for your stomach and esophagus    I am referring you to neurology for a memory test    I do think you need help at home both for your balance and your memory issues .  WE will have an agency call you on your home line   Please make sure your home phone answering machine is not FULL.

## 2017-08-16 DIAGNOSIS — R4189 Other symptoms and signs involving cognitive functions and awareness: Secondary | ICD-10-CM | POA: Insufficient documentation

## 2017-08-16 MED ORDER — INSULIN LISPRO PROT & LISPRO (75-25 MIX) 100 UNIT/ML KWIKPEN: PEN_INJECTOR | SUBCUTANEOUS | 11 refills | Status: DC

## 2017-08-16 NOTE — Assessment & Plan Note (Signed)
Her most recent fall occurred 3 weeks ago while mopping her kitchen floor.  hip and pelvic films were done today due to persistent right hip and low back pain. [name] No fractures.  Needs DEXA.  Needs Home PT advised.

## 2017-08-16 NOTE — Assessment & Plan Note (Signed)
Neurology evaluation advised and accepted for formal assessment given her increasing confusion

## 2017-08-16 NOTE — Progress Notes (Signed)
  I have reviewed the above information and agree with above.   Vuk Skillern, MD 

## 2017-08-16 NOTE — Assessment & Plan Note (Signed)
She has been taking 45 units of mixed insulin twice daily since her meeting with Gray Bernhardt and is having recurrent hypoglycemic episodes in the morning.  Reducing evening dose to 40 units today

## 2017-08-16 NOTE — Telephone Encounter (Signed)
-----   Message from [name], [facility] sent at 08/04/2017  8:01 AM EDT -----  Adjusted her insulin with AM hypos. Better adherence with pill box, still missing metoprolol and pantoprazole (taking pantoprazole PRN so recommended she use OTC zantac instead as needed for improved efficacy w PRN use). She is back on paxil 20 mg daily, no ADE. Pills are not scored and she has large supply, reasonable to continue on 20 mg daily or decrease if ADE arise? She's also taking alprazolam daily (in AM slot of pill box). Recent fall, no residual sx except pain in neck, not sure if she hit head or not, denies s/sx stroke. Consider change alprazolam to PRN and/or change to lorazepam. Did NOT discuss these recommended changes with patient as defer to you.

## 2017-08-16 NOTE — Assessment & Plan Note (Signed)
She has not tolerated protonix ,  Change to omeprazole today

## 2017-08-17 ENCOUNTER — Ambulatory Visit: Payer: Medicare Other | Admitting: Family

## 2017-08-17 ENCOUNTER — Other Ambulatory Visit: Payer: Self-pay

## 2017-08-17 NOTE — Patient Outreach (Signed)
Telephone outreach: Placed call to patient who answers. Confirmed address, name and date of birth. Patient reports that she saw her primary MD on Friday and she needs a nurse.  Reviewed [facility] program. Patient is interested in home visit. Reports that she is very forgetful.   PLAN: reviewed with patient that Landis Martins would call her and book a home visit.  Patient agreed.  [name], RN, BSN, CEN [facility] [employer] [phone]

## 2017-08-18 NOTE — Telephone Encounter (Signed)
Call pace

## 2017-08-19 ENCOUNTER — Telehealth: Payer: Self-pay | Admitting: Pharmacist

## 2017-08-19 NOTE — Patient Outreach (Signed)
Pamela Collier [facility]) Care Management  08/19/2017  Pamela Collier 1940-06-25 [id number]  77 y.o. year old female referred to [facility] for Medication Management (Pharmacy telephone outreach)   PMH s/f: CAD, cardiomyopathy, peripheral vascular disease, HTN, CHF, T2DM, CKD 3, frequent falls, diabetic neuropathy, depression with anxiety, HLD, obesity, polypharmacy, cognitive decline  Patient with BCBS Medicare Part D coverage   Patient confirms identity with HIPAA-identifiers x2 and gives verbal consent to speak over the phone about medications.   Of note, I saw this patient in her primary care clinic under collaborative practice agreement for management of diabetes.   --------------  Called patient to schedule home visit at request of Mable Paris, NP. Patient at first very resistant to my coming to her home however with explanation, patient agrees to schedule with me on same day as [place]. Patient requests clarification on where she needs to schedule for DEXA and mammogram - provided her number to [facility] [phone]. Confirmed with Juliann Pulse, LPN at [facility] that this is where she should go (scheduling instructions in orders for DEXA and mammogram are at two different places).   Communicated availability with [facility] RN, Landis Martins. Landis Martins to call patient and schedule this week.   [name], Pharm.D., BCPS PGY2 Ambulatory Care Pharmacy Resident Phone: [phone]

## 2017-08-20 ENCOUNTER — Other Ambulatory Visit: Payer: Self-pay | Admitting: *Deleted

## 2017-08-20 NOTE — Progress Notes (Signed)
Trying to get in touch [facility] to see if patient can be referred to them. Called Edwina at [facility] left message.

## 2017-08-20 NOTE — Patient Outreach (Signed)
[facility]) Care Management  08/20/2017  Pamela Collier May 19, 1940 [id number]   Telephone screening call   Referral received : 08/11/17 Referral reason : Falls, polypharmacy Referral source: [place] : Medicare     Received in basket message from [place] regarding patient phone call inquiring about her nurse.   Noted patient agreeable to [facility] care management services on 2nd contact 4/8 by covering Care coordinator.  Placed call to patient today to discuss initial home visit, no answer unable to leave a message, phone message states enter remote access.   Will plan return call later today , if no response from patient.    1515 Return call to patient unsuccessful , no answer received same outgoing message of enter remote access code.  Plan Will await return call , if no response will plan return call in the next 2 business days [name] has spoken with patient regarding joint visit and patient has agreed if no return response will consider attending visit.      [name], RN, [place] Management Coordinator  [phone]- Mobile [phone]- Toll Free Main Office

## 2017-08-21 ENCOUNTER — Other Ambulatory Visit: Payer: Self-pay | Admitting: *Deleted

## 2017-08-21 NOTE — Patient Outreach (Addendum)
[name] [facility]) Care Management  08/21/2017  Pamela Collier 11-Jul-1940 [id number]   Telephone outreach  Referral received : 08/11/17 Referral reason : Falls, polypharmacy Referral source: [place] : Medicare   Placed call to patient, no answer able to leave a HIPPA compliant message requesting return call.  Attempting to contact patient for additional assessment and to discuss initial home visit hopefully in the next week.   Plan Will await return call if no response with attempt in next business day.   [name], RN, [place] Management Coordinator  [phone]- Mobile [phone]- Toll Free Main Office

## 2017-08-24 ENCOUNTER — Other Ambulatory Visit: Payer: Self-pay | Admitting: *Deleted

## 2017-08-24 NOTE — Patient Outreach (Signed)
[place] [facility]) Care Management  08/24/2017  Pamela Collier 01/01/1941 [id number]  Telephone assessment   Referral received : 08/11/17 Referral reason : Falls, polypharmacy Referral source: [place] : Medicare     77 year old female with PMHx that includes , Diabetes,   Successful outreach call patient , HIPAA verified.  Patient discussed sometimes she feels good and sometimes she doesn't, complaint of diarrhea over the week but she states she feels better on today.   Conditions Patient discussed condition of diabetes , checks blood sugar twice daily , reading this morning 135.  Patient discussed one recent fall, while cleaning up spill from the fall, reports she is now using a walker at home.  Patient monitoring blood pressure daily and keeps a record. Patient reports weighing daily and today weight 180, denies increased swelling or shortness of breath. Patient discussed having problems with her memory, and gets a little  mixed up at times.    Social  Patient lives at home with her husband that is able to assist her at times with household chores. But he does not vacuum or help dust, and she wishes she had  some help with that . Patient reports they receive meals on wheels. Patient states she is able to drive short distance on days that she feels good, otherwise her husband is able to help.   Medications  Patient discussed she is using a pill organizer for daily medications and some cost concerns related to medication copayments .  Patient agreeable to home visit with community care coordinator, and [facility] Pharm D. Gray Bernhardt,    Plan  Will plan home visit on this week, will further for further needs and complete assessments and care planning and goal settings at visit.  Will send PCP involvement letter.    [name], RN, [place] Management Coordinator  [phone]- Mobile [phone]- Toll Free Main  Office

## 2017-08-25 ENCOUNTER — Encounter: Payer: Self-pay | Admitting: *Deleted

## 2017-08-25 ENCOUNTER — Other Ambulatory Visit: Payer: Self-pay | Admitting: *Deleted

## 2017-08-25 NOTE — Patient Outreach (Signed)
[place] [facility]) Care Management   08/25/2017  Pamela Collier Oct 15, 1940 [id number]  Pamela Collier is an 77 y.o. female  Subjective:  Patient reports feeling a better on today. Has some stiffness in back  after recent but states xrays  Patient states she had episode of chest pain about 2 weeks ago she used her nitroglycerin spray that she keeps it  with her at all times . Patient discussed concern with her memory and easily forgetting , states her doctor is sending her to see a doctor about it in a few weeks.  Patient discussed being excited about a trip to [place] that she is going on next month , traveling by train.    Objective:  BP 120/68 (BP Location: Left Arm, Patient Position: Sitting, Cuff Size: Large)   Pulse 69   Resp 18   Ht 1.702 m (5\' 7" )   Wt 182 lb (82.6 kg)   SpO2 98%   BMI 28.51 kg/m  Review of Systems  Constitutional: Negative.   HENT: Negative.   Eyes: Negative.   Respiratory: Negative.   Cardiovascular: Negative.   Gastrointestinal: Negative.   Genitourinary: Negative.   Musculoskeletal: Positive for falls.  Skin: Negative.   Neurological: Negative.  Negative for dizziness.  Endo/Heme/Allergies: Negative.   Psychiatric/Behavioral: Positive for memory loss.       Concern regarding forgetfulness, and memory problems    Physical Exam  Constitutional: She is oriented to person, place, and time. She appears well-developed and well-nourished.  Cardiovascular: Normal rate, normal heart sounds and intact distal pulses.  Respiratory: Effort normal and breath sounds normal.  GI: Soft.  Neurological: She is alert and oriented to person, place, and time.  Skin: Skin is warm and dry.  Psychiatric: She has a normal mood and affect. Her behavior is normal. Judgment and thought content normal.    Encounter Medications:   Outpatient Encounter Medications as of 08/25/2017  Medication Sig  . acetaminophen (TYLENOL) 500 MG tablet Take 1,000 mg  by mouth every 6 (six) hours as needed for mild pain or headache.   . ALPRAZolam (XANAX) 0.25 MG tablet TAKE 1 TABLET BY MOUTH ONCE DAILY  . aspirin 81 MG chewable tablet Chew by mouth daily.  . B-D UF III MINI PEN NEEDLES 31G X 5 MM MISC USE THREE TIMES A DAY  . BRILINTA 90 MG TABS tablet Take 1 tablet (90 mg total) by mouth 2 (two) times daily.  . budesonide-formoterol (SYMBICORT) 160-4.5 MCG/ACT inhaler Inhale 2 puffs into the lungs 2 (two) times daily.  [name] docusate sodium (COLACE) 100 MG capsule Take 100 mg by mouth 2 (two) times daily as needed for mild constipation. Reported on 11/09/2015  . feeding supplement, GLUCERNA SHAKE, (GLUCERNA SHAKE) LIQD Take 237 mLs by mouth 2 (two) times daily between meals.  . fluticasone (FLONASE) 50 MCG/ACT nasal spray Place 2 sprays into both nostrils daily as needed for rhinitis.  . furosemide (LASIX) 20 MG tablet Take 1 tablet (20 mg total) by mouth every other day.  [name] glucose blood (ACCU-CHEK AVIVA PLUS) test strip USE TO CHECK GLUCOSE  THREE TIMES DAILY  . Insulin Lispro Prot & Lispro (HUMALOG MIX 75/25 KWIKPEN) (75-25) 100 UNIT/ML Kwikpen Inject 45 units in the morning and 40 units in the evening  . isosorbide mononitrate (IMDUR) 30 MG 24 hr tablet TAKE 1 TABLET DAILY  . losartan (COZAAR) 100 MG tablet TAKE 1 TABLET DAILY  . LYRICA 100 MG capsule TAKE 1 CAPSULE BY  MOUTH THREE TIMES DAILY (Patient taking differently: Take 1 capsule 2 times daily)  . metoprolol succinate (TOPROL-XL) 25 MG 24 hr tablet Take 1 tablet (25 mg total) by mouth daily.  . nitroGLYCERIN (NITROLINGUAL) 0.4 MG/SPRAY spray Place 1 spray under the tongue every 5 (five) minutes x 3 doses as needed for chest pain.  [name] omeprazole (PRILOSEC) 40 MG capsule Take 1 capsule (40 mg total) by mouth daily.  . ondansetron (ZOFRAN) 4 MG tablet Take 1 tablet (4 mg total) by mouth every 8 (eight) hours as needed for nausea or vomiting.  . pantoprazole (PROTONIX) 40 MG tablet Take 1 tablet (40 mg total)  by mouth 2 (two) times daily.  [name] PARoxetine (PAXIL) 20 MG tablet TAKE 1 TABLET BY MOUTH ONCE DAILY  . polyethylene glycol (MIRALAX / GLYCOLAX) packet Take 17 g by mouth daily as needed for mild constipation. Reported on 10/25/2015  . rosuvastatin (CRESTOR) 20 MG tablet TAKE ONE TABLET BY MOUTH ONCE DAILY   Facility-Administered Encounter Medications as of 08/25/2017  Medication  . albuterol (PROVENTIL) (5 MG/ML) 0.5% nebulizer solution 2.5 mg    Functional Status:   In your present state of health, do you have any difficulty performing the following activities: 08/24/2017 06/04/2017  Hearing? N N  Vision? N N  Difficulty concentrating or making decisions? Y N  Comment - -  Walking or climbing stairs? Y Y  Dressing or bathing? N N  Doing errands, shopping? Y N  Comment husband helps -  Conservation officer, nature and eating ? Y -  Comment husband helps -  Using the Toilet? N -  In the past six months, have you accidently leaked urine? Y -  Comment wears pad -  Do you have problems with loss of bowel control? N -  Managing your Medications? N -  Managing your Finances? N -  Housekeeping or managing your Housekeeping? Y -  Comment husbands helps -  Some recent data might be hidden    Fall/Depression Screening:    Fall Risk  08/24/2017 11/10/2016 07/01/2016  Falls in the past year? Yes No Yes  Comment - - -  Number falls in past yr: 1 - 1  Injury with Fall? - - No  Risk Factor Category  - - -  Risk for fall due to : History of fall(s) - Impaired balance/gait  Risk for fall due to: Comment - - -  Follow up Falls prevention discussed - Falls prevention discussed;Education provided   [facility] 2/9 Scores 08/24/2017 11/10/2016 07/01/2016 02/12/2016 12/03/2015 10/10/2015 10/30/2014  PHQ - 2 Score 3 0 0 3 0 1 0  PHQ- 9 Score 6 - - 7 - - -  Exception Documentation - - - - - - -    Assessment:  Initial home visit , with Gray Bernhardt, Pharm. D.   Falls Risk  Recent fall while cleaning spill from kitchen  floor.  Home safety evaluation, Patient uses cane while  sitting in her den area,then climbs 3 steps to get into to kitchen and bedroom area. Patient has grab bar on one side in place and loose rail on the other side . Patient has cane and rollator walker for use at home, denies dizziness . Patient does not have a medical alert system, she used to have a system but cost was a concern so she turned it in.    Diabetes  Patient continues to monitor blood sugar twice daily, 30 day average is 137.  Patient reports she continues to  monitor blood sugar twice daily and keep a record, reviewed.Patient reports she watches her diet. Review blood sugar readings.  Noted one episode of hypoglycemia 68 am reading, will review rule of 15 education .   Psychosocial  Patient discussed having some difficulty with housekeeping chores, she is not able to vacuum and her husband she described as not always supportive with assistance  with household chores. Patient also discussed she would like to get out of the house sometimes, to be with other groups of seniors. Patient discussed concerns about transportation,at times when she or her husband are not able to drive.   Heart Failure  Weighing daily and keeping a record on her calendar, patient reports taking her lasix every other day, increase in weight gain, able to state symptoms yellow zone to notify MD of weight gain of 3 pounds in a day/5 in a week. Increased shortness of breath , swelling.   Sleep Apnea Wearing CPAP nightly per report.  Plan:  Will plan follow up call in the next 2 weeks Provided and reviewed [facility] welcome packet and calendar.  Will place LCSW consult for community resources senior center activities , transportation and resource for repairing loose stair rail.  Will send PCP visit note  Provided EMMI on preventing falls in elderly   [facility] CM Care Plan Problem One     Most Recent Value  Care Plan Problem One  High fall risk related to recent  fall   Role Documenting the Problem One  Care Management [place] for Problem One  Active  [facility] Long Term Goal   Patient will report not falls in the next 60 days   [facility] Long Term Goal Start Date  08/25/17  Interventions for Problem One Long Term Goal  RN reviewed with patient, importance of notifying MD of falls , complaints of dizziness. . Reviewed using walker at all times, wearing non slip shoes, reviewed EMMI on preventing falls in elderly   [facility] CM Short Term Goal #1   Pateint will report doing chair exercise at least 3 times a day in the next 30 days   [facility] CM Short Term Goal #1 Start Date  08/25/17  Interventions for Short Term Goal #1  Discussed with patient exerices plan previously prescribed by PT , she has instructions, for sitting in chair and demonstrates routine   [facility] CM Short Term Goal #2   Patient will report improved home safety environement in the next 30 days   [facility] CM Short Term Goal #2 Start Date  08/25/17  Interventions for Short Term Goal #2  Home safety evaluation completed, noted loose rails up stairs, LCSW consult for communtiy resources of repair.     [facility] CM Care Plan Problem Two     Most Recent Value  Care Plan Problem Two  Diabetes self care management   Role Documenting the Problem Two  Care Management Coordinator  Care Plan for Problem Two  Active  [facility] CM Short Term Goal #1   Patient will be able to report 3 symptoms of hypoglycemia and how to treat   [facility] CM Short Term Goal #1 Start Date  08/25/17  Interventions for Short Term Goal #2   Reviewed the rule of 15, emphasized rechecking blood sugar after 15 mintues , reviewed examples of choices for treating low sugar.,       [name], RN, [place] Management Coordinator  [phone]- Mobile [phone]- [place]

## 2017-08-25 NOTE — Patient Outreach (Signed)
[facility]) Care Management  [place]   08/26/2017  Pamela Collier [birth year] [id number]   77 y.o. year old female referred to [place] for Initial Home Visit   This encounter was a co-visit with Landis Martins, [facility] Nurse. Of note, patient has seen me previously in different setting under collaborative practice agreement for management of diabetes.  PMH s/f: CAD s/p stents, cardiomyopathy, peripheral vascular disease, HTN, CHF, T2DM, CKD 3, frequent falls, diabetic neuropathy, depression with anxiety, HLD, obesity, polypharmacy, cognitive decline  Patient with BCBS Medicare Part D coverage  SUBJECTIVE:   Medication Adherence: Patient reports 100% adherence to medications, using a pill box that was given to her by an unknown home health agency. Reports she is not taking paxil as ot "made me feel like a drug addict". Additionally, no longer on pantoprazole (now on omeprazole), polyethylene glycol, ondansetron. Reports she is rinsing out mouth and spitting out water twice daily after Symbicort twice daily.   Requests a medication list with large print  Medication Assistance:  Reports lyrica, brilinta, and insulin are expensive but refuses patient assistance program applications and LIS/extra help application as husband does not want financial information disclosed. Reports she has some medical bills that she is making monthly payments on.   Medication Management:  #Diabetes - Reports Humalog Mix 75/25 dose was adjusted to 40 units BID with meals. Reports she takes ~30 minutes prior to meals. Patient records CBGs twice daily (fasting, bedtime). Denies regular hypoglycemia but does have a few fasting values in 60s. Reports she corrects with small serving of orange juice. Has hand weights for chair exercises 3 times weekly. Denies issues with ulcers on feet.   #Frequent falls - Last fall a few weeks ago - slipped in coffee that her husband had spilled. The fall  occurred after she had had breakfast and taken medications. Is taking alprazolam 0.25 mg DAILY in the mornings. Denies dizziness/drowsiness. Has a walker that has seat on it and uses to ambulate around home.  #Preventative health - successfully scheduled DEXA and Mammogram    #Breathing issues (no dx of asthma or COPD in chart) - reports symbicort BID is controlling breathing. Has nebulizer with albuterol - last used when she was in [place] 2 months ago.   #CAD with h/o stents- Denies bleeding or bruising outside of normal, no dark tarry stools, no blood in urine or feces. Reports she has prescription for sublingual nitroglycerin spray that she last used 2 weeks ago. Found this 1-time dose to be effective for resolution of chest pain  #GERD - reports sx are resolved since starting daily omeprazole.   #Depression/anxiety - Reports her mood is "ok, it goes up and down" but feels better off of paxil.   OBJECTIVE:  Pill box filled correctly except double dose on furosemide one day- supposed to take QOD.   CBGs - fasting: 100s-130s, excursions to 68, 78, 154; bedtime:130-180, excursions to 196, 232 30 day average = 137  No edema appreciated in lower extrimities Callous on bottom of L foot noted - not red or warm to touch    Encounter Medications: Outpatient Encounter Medications as of 08/25/2017  Medication Sig Note  . acetaminophen (TYLENOL) 500 MG tablet Take 1,000 mg by mouth every 6 (six) hours as needed for mild pain or headache.    . ALPRAZolam (XANAX) 0.25 MG tablet TAKE 1 TABLET BY MOUTH ONCE DAILY   . aspirin 81 MG chewable tablet Chew 81 mg by  mouth daily.    . B-D UF III MINI PEN NEEDLES 31G X 5 MM MISC USE THREE TIMES A DAY   . BRILINTA 90 MG TABS tablet Take 1 tablet (90 mg total) by mouth 2 (two) times daily.   . budesonide-formoterol (SYMBICORT) 160-4.5 MCG/ACT inhaler Inhale 2 puffs into the lungs 2 (two) times daily.   [name] docusate sodium (COLACE) 100 MG capsule Take 100 mg  by mouth daily as needed for mild constipation. Reported on 11/09/2015   . feeding supplement, GLUCERNA SHAKE, (GLUCERNA SHAKE) LIQD Take 237 mLs by mouth 2 (two) times daily between meals.   . fluticasone (FLONASE) 50 MCG/ACT nasal spray Place 2 sprays into both nostrils daily as needed for rhinitis.   . furosemide (LASIX) 20 MG tablet Take 1 tablet (20 mg total) by mouth every other day.   [name] glucose blood (ACCU-CHEK AVIVA PLUS) test strip USE TO CHECK GLUCOSE  THREE TIMES DAILY   . Insulin Lispro Prot & Lispro (HUMALOG MIX 75/25 KWIKPEN) (75-25) 100 UNIT/ML Kwikpen Inject 45 units in the morning and 40 units in the evening (Patient taking differently: Inject 40 units with breakfast and 40 units in with supper)   . isosorbide mononitrate (IMDUR) 30 MG 24 hr tablet TAKE 1 TABLET DAILY   . losartan (COZAAR) 100 MG tablet TAKE 1 TABLET DAILY   . LYRICA 100 MG capsule TAKE 1 CAPSULE BY MOUTH THREE TIMES DAILY (Patient taking differently: Take 1 capsule 2 times daily) 08/25/2017: Sometimes takes BID to save money  . metoprolol succinate (TOPROL-XL) 25 MG 24 hr tablet Take 1 tablet (25 mg total) by mouth daily.   . nitroGLYCERIN (NITROLINGUAL) 0.4 MG/SPRAY spray Place 1 spray under the tongue every 5 (five) minutes x 3 doses as needed for chest pain.   [name] omeprazole (PRILOSEC) 40 MG capsule Take 1 capsule (40 mg total) by mouth daily.   . rosuvastatin (CRESTOR) 20 MG tablet TAKE ONE TABLET BY MOUTH ONCE DAILY   . [DISCONTINUED] ondansetron (ZOFRAN) 4 MG tablet Take 1 tablet (4 mg total) by mouth every 8 (eight) hours as needed for nausea or vomiting. (Patient not taking: Reported on 08/25/2017)   . [DISCONTINUED] pantoprazole (PROTONIX) 40 MG tablet Take 1 tablet (40 mg total) by mouth 2 (two) times daily. (Patient not taking: Reported on 08/25/2017)   . [DISCONTINUED] PARoxetine (PAXIL) 20 MG tablet TAKE 1 TABLET BY MOUTH ONCE DAILY (Patient not taking: Reported on 08/25/2017)   . [DISCONTINUED] polyethylene  glycol (MIRALAX / GLYCOLAX) packet Take 17 g by mouth daily as needed for mild constipation. Reported on 10/25/2015    Facility-Administered Encounter Medications as of 08/25/2017  Medication  . albuterol (PROVENTIL) (5 MG/ML) 0.5% nebulizer solution 2.5 mg    Functional Status: In your present state of health, do you have any difficulty performing the following activities: 08/24/2017 06/04/2017  Hearing? N N  Vision? N N  Difficulty concentrating or making decisions? Y N  Comment - -  Walking or climbing stairs? Y Y  Dressing or bathing? N N  Doing errands, shopping? Y N  Comment husband helps -  Conservation officer, nature and eating ? Y -  Comment husband helps -  Using the Toilet? N -  In the past six months, have you accidently leaked urine? Y -  Comment wears pad -  Do you have problems with loss of bowel control? N -  Managing your Medications? N -  Managing your Finances? N -  Housekeeping or managing your  Housekeeping? Y -  Comment husbands helps -  Some recent data might be hidden    Fall/Depression Screening: Fall Risk  08/24/2017 11/10/2016 07/01/2016  Falls in the past year? Yes No Yes  Comment - - -  Number falls in past yr: 1 - 1  Injury with Fall? - - No  Risk Factor Category  - - -  Risk for fall due to : History of fall(s) - Impaired balance/gait  Risk for fall due to: Comment - - -  Follow up Falls prevention discussed - Falls prevention discussed;Education provided   [facility] 2/9 Scores 08/24/2017 11/10/2016 07/01/2016 02/12/2016 12/03/2015 10/10/2015 10/30/2014  PHQ - 2 Score 3 0 0 3 0 1 0  PHQ- 9 Score 6 - - 7 - - -  Exception Documentation - - - - - - -      Drugs sorted by system:  Neurologic/Psychologic:alprazolam  Cardiovascular:Brilinta, aspirin 81 mg, furosemide, losartan, metoprolol succinate, nitroglycerin sublingual spray, isosorbide mononitrate, rosuvastatin  Pulmonary/Allergy:symbicort, albuterol PRN, flonase nasal spray  Gastrointestinal:omeprazole,  colace  Endocrine: Humalog mix 75/25, glucerna  Pain: Lyrica, acetaminophen   Duplications in therapy: none Gaps in therapy: vitamin D and calcium Medications to avoid in the elderly: alprazolam (potentially inappropriate medication in older adult due to loss of phase 1 metabolism and potential for accumulation, increasing risk of falls).  Drug interactions: none clinically significant Other issues noted: none   PLAN: -Prepared medication list for patient with large print and mailed to her home address with her permission -Counseled on hypoglycemia management -Counseled on expiration date for nitroglycerin -Discussed alprazolam recommendation to change to lorazepam and use PRN instead of scheduled with Mable [place], NP.  -Cancelled follow up appointment with me in clinic (under collaborative practice agreement) as diabetes is well controlled with minimal hypoglycemia.   No further  Pharmacy needs identified at this time. Patient to follow up with me if she decides she is interested in pursuing patient assistance. Pharmacy signing off at this time.   [name], Pharm.D., BCPS PGY2 Ambulatory Care Pharmacy Resident Phone: [phone]    Copy of medication list sent to patient::  1. Alprazolam 0.25 mg -1 pill by mouth every day - nerves/anxiety 2. Brilinta (ticagrelor) 90 mg - 1 pill by mouth twice a day - anti-platelet agent/blood thinner to keep your blood vessels open in your heart 3. Aspirin 81 mg - 1 pill by mouth once a day - to reduce your risk of future heart attacks and strokes 4. Furosemide (Lasix) 20 mg - 1 pill by mouth every other day - to take fluid off of your legs and reduce shortness of breath 5. Losartan 100 mg - 1 pill by mouth every day - blood pressure 6. Metoprolol succinate 25 mg - 1 pill by mouth every day - blood pressure/heart medicine 7. Nitroglycerin sublingual spray - 1 spray under tongue every 5 minutes x3 uses as needed for chest  pain 8. Isosorbide mononitrate 30 mg - 1 pill by mouth every day - heart medicine/chest pain 9. Rosuvastatin (crestor) 20 mg - 1 pill by mouth every day - cholesterol/heart medicine 10. Symbicort 160-4.5 mcg/act inhaler - 2 puffs into the lungs twice a day, rinse out mouth and spit out water afterwards - breathing 11. Albuterol 0.5% nebulizer solution - inhale 1 nebule as needed for breathing 12. Fluticasone (Flonase) nasal spray - 2 sprays into each nostril once a day as needed - allergies 13. Omeprazole 40 mg  - 1 pill once a  day - stomach acid/heart burn/GERD 14. Docusate (Colace) 100 mg - 1 pill once a day as needed for constipation 15. Humalog mix (insulin) 75/25 - inject 40 units under the skin with breakfast and supper - diabetes 16. Lyrica (pregabalin) 100 mg - 1 pill three times a day for pain (nerve pain) 17. Acetaminophen 500 mg - 1-2 pills every 6 hours as needed for mild pain/headache

## 2017-08-26 ENCOUNTER — Encounter: Payer: Self-pay | Admitting: *Deleted

## 2017-08-28 ENCOUNTER — Other Ambulatory Visit: Payer: Self-pay | Admitting: *Deleted

## 2017-08-28 NOTE — Patient Outreach (Addendum)
[facility]) Care Management  08/28/2017  Pamela Collier 10-Apr-1941 [id number]   Phone call to patient to review community resources for home repairs and transportation. Patient did not answer. Voicemail message left for a return call.   Plan: Outreach letter to be sent to patient.           Second attempt to reach patient to take place on 08/31/17.            Contact information for the [facility] to be provided to patient for home repairs [phone].   [name] [facility] Care Management [phone]

## 2017-08-31 ENCOUNTER — Other Ambulatory Visit: Payer: Self-pay | Admitting: *Deleted

## 2017-08-31 ENCOUNTER — Ambulatory Visit: Payer: Medicare Other | Admitting: Pharmacist

## 2017-08-31 NOTE — Patient Outreach (Signed)
[place] [facility]) Care Management  08/31/2017  Pamela Collier 1940-10-01 [id number]   Second attempt to reach patient to review community resources for home repairs and transportation. Patient did no answer. Voicemail message left for a return call.  Plan: This Education officer, museum will complete follow up call within 1 week.    [name] [facility] Care Management [phone]

## 2017-09-02 ENCOUNTER — Other Ambulatory Visit: Payer: Self-pay | Admitting: *Deleted

## 2017-09-02 NOTE — Patient Outreach (Signed)
[place] [facility]) Care Management  09/02/2017  Pamela Collier 1941-02-15 [id number]   Patient referred to this social worker to assist with community resources to fix a railing in her home to avoid falls. Patient provided with the contact information for the [place] to contact for assistance. This social worker explained that gthis agency only takes direct referrals. Patient or her spouse, would have tocall to request the assistance, Patient states that she is grateful for the informaiton as several in home providers have verbalized their concern regarding the railing being unsafe.       Plan: This Education officer, museum will follow up with patient regarding making contact with this agency within 30 days.   [name] [facility] Care Management [phone]

## 2017-09-04 ENCOUNTER — Ambulatory Visit
Admission: RE | Admit: 2017-09-04 | Discharge: 2017-09-04 | Disposition: A | Discharge status: Home / Self Care | Payer: Medicare Other | Source: Ambulatory Visit | Attending: Internal Medicine | Admitting: Internal Medicine

## 2017-09-04 DIAGNOSIS — Z1231 Encounter for screening mammogram for malignant neoplasm of breast: Secondary | ICD-10-CM | POA: Diagnosis not present

## 2017-09-04 DIAGNOSIS — Z1239 Encounter for other screening for malignant neoplasm of breast: Secondary | ICD-10-CM

## 2017-09-08 ENCOUNTER — Other Ambulatory Visit: Payer: Self-pay | Admitting: *Deleted

## 2017-09-08 NOTE — Patient Outreach (Signed)
[facility]) Care Management  09/08/2017  SKYLIN KENNERSON 30-Mar-1941 [id number]   Referral received : 08/11/17 Referral reason : Falls, polypharmacy Referral source: [place] : Medicare    77 year old female with PMHx that includes , Diabetes, Heart failure, Hypertension,   Placed call to patient, HIPAA verified . Patient discussed feeling tired on today states she did not rest well last night, but she has good days and bad days.   Patient discussed her recent blood sugar readings this morning she was 79 then she had breakfast , denies having low blood sugar symptoms, she did not recheck it after `15 minutes. Patient reports today was the lowest reading she has had in the last 2 weeks, usual morning readings 121- 157 and evenings 166- 203. Patient reports continuing to take insulin as prescribed.   Patient denies having recent fall, discussed she has received call from church group looking into helping with getting railing more stable.   Patient reports she has not been keeping up with her weights on a daily basis but she will get back to it, denies increase in swelling or shortness of breath, last weight recorded was 183 patient reports this is in her usual range.   Patient discussed looking forward to going out of town on next week for a few days, she will travel by train.  Patient again discussed interest in being involved with seniors activities in the community, something to get out of the house .  Plan Will schedule home visit in the next month.  Will discuss with Ch[facility] Land [facility] LCSW regarding patient request for senior activities in the area.    [name], RN, [place] Management Coordinator  [phone]- Mobile [phone]- Toll Free Main Office

## 2017-09-11 DIAGNOSIS — E1042 Type 1 diabetes mellitus with diabetic polyneuropathy: Secondary | ICD-10-CM | POA: Diagnosis not present

## 2017-09-11 DIAGNOSIS — B351 Tinea unguium: Secondary | ICD-10-CM | POA: Diagnosis not present

## 2017-09-11 DIAGNOSIS — L851 Acquired keratosis [keratoderma] palmaris et plantaris: Secondary | ICD-10-CM | POA: Diagnosis not present

## 2017-09-14 NOTE — Progress Notes (Signed)
[facility] referral made.

## 2017-09-21 ENCOUNTER — Other Ambulatory Visit: Payer: Self-pay | Admitting: *Deleted

## 2017-09-21 NOTE — Patient Outreach (Signed)
[facility]) Care Management  09/21/2017  JERICHA BRYDEN 10-18-1940 [id number]   Phone call to the [place] to confirm that the referral had been received to request repair of patient's railing in her home to avoid falls and injury. It was confirmed that the referral had been received, however patient has not returned the intake paperwork. The work cannot be completed if the intake paperwork has not been returned.   Plan: This Education officer, museum will follow up with patient regarding the importance of completing and returning the paperwork to for her railing repair.    [name] [facility] Care Management [phone]

## 2017-09-21 NOTE — Patient Outreach (Signed)
[place] [facility]) Care Management  09/21/2017  KHIYA FRIESE 1941/03/02 [id number]   Phone call to patient to follow up on referral to the [facility] for assistance with getting her railing repaired in her home. Per patient they have contacted her and they are working on getting a volunteer to assist with the needed repair. In addition, patient states that her nephew is coming ito visit her in August and she will see if he may be able to repair it as well.   Plan: This Education officer, museum will follow up with the [place] to confirm that referral was received. This Education officer, museum to follow up within 2 weeks.   [name] [facility] Care Management [phone]

## 2017-09-21 NOTE — Patient Outreach (Signed)
[place] [facility]) Care Management  09/21/2017  Pamela Collier November 29, 1940 [id number]   Phone call to patient to inform her that the referral was received for the railing repair through the [employer], however they need the intake paperwork completed and returned before any work cn be done. Per patient, she received the paperwork but did not understand that she needed to completed the paperwork. She will find the paperwork, complete and return in as soon as possible.   Plan: This Education officer, museum will follow up with patient within 2 weeks regarding the completion of the needed paperwork     [place], [place] Management [phone]

## 2017-09-23 ENCOUNTER — Other Ambulatory Visit: Payer: Self-pay | Admitting: *Deleted

## 2017-09-23 ENCOUNTER — Ambulatory Visit: Payer: Self-pay

## 2017-09-23 ENCOUNTER — Ambulatory Visit: Payer: Self-pay | Admitting: *Deleted

## 2017-09-23 NOTE — Telephone Encounter (Signed)
Pt has been scheduled for 09/25/2017 at 11:30am. Pt is aware of appt date and time.

## 2017-09-23 NOTE — Patient Outreach (Signed)
[facility]) Care Management   09/23/2017  Pamela Collier October 11, 1940 [id number]  Pamela Collier is an 77 y.o. female  Subjective:  Patient discussed concern regarding recent dark black colored formed bowel movement , no blood noted. SHe discussed having dark colored diarrhea while out of town this weekend.  Patient reports having 2 soft black stools on today . Patient states she had some pain in right arm, she is unsure why denies having chest pain .    Patient report having a fall on carpet after slipping while trying to it sit in rolling desk chair.   Patient discussed having an upcoming appointment,with a doctor about her memory, she shows me a paper with Dr.Shah contact number.   Objective:   weight is 182 lb (82.6 kg). Her blood pressure is 120/60 and her pulse is 63. Her respiration is 18 and oxygen saturation is 97%.   Review of Systems  Constitutional: Negative.   HENT: Negative.   Eyes: Negative.   Respiratory: Negative.   Cardiovascular: Negative.   Gastrointestinal: Negative.   Genitourinary: Negative.   Skin: Negative.   Neurological: Negative.   Endo/Heme/Allergies: Negative.   Psychiatric/Behavioral: The patient is nervous/anxious.     Physical Exam  Constitutional: She is oriented to person, place, and time. She appears well-developed.  Cardiovascular: Normal rate and normal heart sounds.  Respiratory: Effort normal.  GI: Soft.  Neurological: She is alert and oriented to person, place, and time.  Skin: Skin is warm and dry.     Left foot 3rd toe with sl bruise, 2nd toe healing blister area.   Psychiatric: She has a normal mood and affect. Her behavior is normal. Judgment and thought content normal.    Encounter Medications:   Outpatient Encounter Medications as of 09/23/2017  Medication Sig Note  . acetaminophen (TYLENOL) 500 MG tablet Take 1,000 mg by mouth every 6 (six) hours as needed for mild pain or headache.    . ALPRAZolam  (XANAX) 0.25 MG tablet TAKE 1 TABLET BY MOUTH ONCE DAILY   . aspirin 81 MG chewable tablet Chew 81 mg by mouth daily.    . B-D UF III MINI PEN NEEDLES 31G X 5 MM MISC USE THREE TIMES A DAY   . BRILINTA 90 MG TABS tablet Take 1 tablet (90 mg total) by mouth 2 (two) times daily.   . budesonide-formoterol (SYMBICORT) 160-4.5 MCG/ACT inhaler Inhale 2 puffs into the lungs 2 (two) times daily.   [name] docusate sodium (COLACE) 100 MG capsule Take 100 mg by mouth daily as needed for mild constipation. Reported on 11/09/2015   . feeding supplement, GLUCERNA SHAKE, (GLUCERNA SHAKE) LIQD Take 237 mLs by mouth 2 (two) times daily between meals.   . fluticasone (FLONASE) 50 MCG/ACT nasal spray Place 2 sprays into both nostrils daily as needed for rhinitis.   . furosemide (LASIX) 20 MG tablet Take 1 tablet (20 mg total) by mouth every other day.   [name] glucose blood (ACCU-CHEK AVIVA PLUS) test strip USE TO CHECK GLUCOSE  THREE TIMES DAILY   . Insulin Lispro Prot & Lispro (HUMALOG MIX 75/25 KWIKPEN) (75-25) 100 UNIT/ML Kwikpen Inject 45 units in the morning and 40 units in the evening (Patient taking differently: Inject 40 units with breakfast and 40 units in with supper)   . isosorbide mononitrate (IMDUR) 30 MG 24 hr tablet TAKE 1 TABLET DAILY   . losartan (COZAAR) 100 MG tablet TAKE 1 TABLET DAILY   . LYRICA 100 MG  capsule TAKE 1 CAPSULE BY MOUTH THREE TIMES DAILY (Patient taking differently: Take 1 capsule 2 times daily) 08/25/2017: Sometimes takes BID to save money  . metoprolol succinate (TOPROL-XL) 25 MG 24 hr tablet Take 1 tablet (25 mg total) by mouth daily.   . nitroGLYCERIN (NITROLINGUAL) 0.4 MG/SPRAY spray Place 1 spray under the tongue every 5 (five) minutes x 3 doses as needed for chest pain.   [name] omeprazole (PRILOSEC) 40 MG capsule Take 1 capsule (40 mg total) by mouth daily.   . rosuvastatin (CRESTOR) 20 MG tablet TAKE ONE TABLET BY MOUTH ONCE DAILY    Facility-Administered Encounter Medications as of  09/23/2017  Medication  . albuterol (PROVENTIL) (5 MG/ML) 0.5% nebulizer solution 2.5 mg    Functional Status:   In your present state of health, do you have any difficulty performing the following activities: 08/24/2017 06/04/2017  Hearing? N N  Vision? N N  Difficulty concentrating or making decisions? Y N  Comment - -  Walking or climbing stairs? Y Y  Dressing or bathing? N N  Doing errands, shopping? Y N  Comment husband helps -  Conservation officer, nature and eating ? Y -  Comment husband helps -  Using the Toilet? N -  In the past six months, have you accidently leaked urine? Y -  Comment wears pad -  Do you have problems with loss of bowel control? N -  Managing your Medications? N -  Managing your Finances? N -  Housekeeping or managing your Housekeeping? Y -  Comment husbands helps -  Some recent data might be hidden    Fall/Depression Screening:    Fall Risk  08/24/2017 11/10/2016 07/01/2016  Falls in the past year? Yes No Yes  Comment - - -  Number falls in past yr: 1 - 1  Injury with Fall? - - No  Risk Factor Category  - - -  Risk for fall due to : History of fall(s) - Impaired balance/gait  Risk for fall due to: Comment - - -  Follow up Falls prevention discussed - Falls prevention discussed;Education provided   [facility] 2/9 Scores 08/24/2017 11/10/2016 07/01/2016 02/12/2016 12/03/2015 10/10/2015 10/30/2014  PHQ - 2 Score 3 0 0 3 0 1 0  PHQ- 9 Score 6 - - 7 - - -  Exception Documentation - - - - - - -    Assessment:  Routine home visit   Concern related to dark stools- PCP has been notified of concern and office appointment planned.  Diabetes- lowest reading  78 to 182, no low blood sugar readings .   Recent fall - denies injury, continue fall prevention education .  Heart Failure- No sudden weight gain, or complaint of swelling or shortness of breath .  Medication - taking medication as prescribed , patient fills own pill organizer.    Plan:  Placed call to [place] office to  confirm patient appointment date and time. Will plan follow up call in the next 2 weeks.   THN CM Care Plan Problem One     Most Recent Value  Care Plan Problem One  High fall risk related to recent fall   Role Documenting the Problem One  Care Management [place] for Problem One  Active  THN Long Term Goal   Patient will report no falls in the next 60 days   THN Long Term Goal Start Date  08/25/17  Interventions for Problem One Long Term Goal  Reviewed fall precautions ,  encouraged to notify MD of  falls with injury .   THN CM Short Term Goal #1   Pateint will report doing chair exercise at least 3 times a day in the next 30 days   THN CM Short Term Goal #1 Start Date  08/25/17  [facility] CM Short Term Goal #1 Met Date  09/23/17  THN CM Short Term Goal #2   Patient will report improved home safety environment in the next 30 days   THN CM Short Term Goal #2 Start Date  08/25/17    [facility] CM Care Plan Problem Two     Most Recent Value  Care Plan Problem Two  Diabetes self care management   Role Documenting the Problem Two  Care Management Coordinator  Care Plan for Problem Two  Active  THN CM Short Term Goal #1   Patient will be able to report 3 symptoms of hypoglycemia and how to treat in the next 30 days    THN CM Short Term Goal #1 Start Date  08/25/17  [facility] CM Short Term Goal #1 Met Date   09/23/17      [name], RN, [place] Management Coordinator  [phone]- Mobile [phone]- [place]

## 2017-09-23 NOTE — Patient Outreach (Signed)
[place] [facility]) Care Management  09/23/2017  SOLEY HARRISS 1940/08/27 [id number]   Placed call to patient prior to home visit for today, spoke with patient HIPAA verified.  Patient reports concern related to problem with diarrhea 3 days ago on Sunday, that resolved after Pepto bismol.  Patient reports diarrhea dark colored. Patient discussed trip to [place] over the weekend she traveled by train. Patient denies abdominal pain or fever, but complained on bloated stomach over the weekend that has improved.  Patient discussed she has not notified PCP office.  Placed call to [place] office to report patient complaints and request follow up .   Plan  Will plan home visit later today for assessment . [name]  [name], RN, [place] Management Coordinator  [phone]- Mobile [phone]- Toll Free Main Office

## 2017-09-23 NOTE — Telephone Encounter (Signed)
fyi

## 2017-09-23 NOTE — Telephone Encounter (Signed)
Pt. Reports she had 4 diarrhea stools Saturday. Took 1 dose of Pepto. No nausea or fever. Started having "black" stools ( diarrhea was black). No abdominal pain or fever. Pt. Reports she is concerned. Request appointment. No availability. Will send triage over to [facility]. Please advise pt.  Reason for Disposition . [1] Abnormal color is unexplained AND [2] persists > 24 hours  Answer Assessment - Initial Assessment Questions 1. COLOR: "What color is it?" "Is that color in part or all of the stool?"     Black 2. ONSET: "When was the unusual color first noted?"     Started 3 days ago 3. CAUSE: "Have you eaten any food or taken any medicine of this color?" (See listing in BACKGROUND)     Pepto - Took 1 dose Saturday 4. OTHER SYMPTOMS: "Do you have any other symptoms?" (e.g., diarrhea, jaundice, abdominal pain, fever).     Diarrhea has stopped. No fever.  Protocols used: STOOLS - UNUSUAL COLOR-A-AH

## 2017-09-25 ENCOUNTER — Ambulatory Visit (INDEPENDENT_AMBULATORY_CARE_PROVIDER_SITE_OTHER): Payer: Medicare Other | Admitting: Internal Medicine

## 2017-09-25 ENCOUNTER — Encounter: Payer: Self-pay | Admitting: Internal Medicine

## 2017-09-25 VITALS — BP 130/58 | HR 65 | Temp 98.6°F | Resp 16 | Ht 67.0 in | Wt 186.0 lb

## 2017-09-25 DIAGNOSIS — D649 Anemia, unspecified: Secondary | ICD-10-CM

## 2017-09-25 DIAGNOSIS — N181 Chronic kidney disease, stage 1: Secondary | ICD-10-CM | POA: Diagnosis not present

## 2017-09-25 DIAGNOSIS — E1122 Type 2 diabetes mellitus with diabetic chronic kidney disease: Secondary | ICD-10-CM

## 2017-09-25 DIAGNOSIS — I129 Hypertensive chronic kidney disease with stage 1 through stage 4 chronic kidney disease, or unspecified chronic kidney disease: Secondary | ICD-10-CM | POA: Diagnosis not present

## 2017-09-25 DIAGNOSIS — N183 Chronic kidney disease, stage 3 unspecified: Secondary | ICD-10-CM

## 2017-09-25 DIAGNOSIS — M810 Age-related osteoporosis without current pathological fracture: Secondary | ICD-10-CM | POA: Diagnosis not present

## 2017-09-25 DIAGNOSIS — I255 Ischemic cardiomyopathy: Secondary | ICD-10-CM | POA: Diagnosis not present

## 2017-09-25 DIAGNOSIS — K921 Melena: Secondary | ICD-10-CM | POA: Insufficient documentation

## 2017-09-25 LAB — CBC WITH DIFFERENTIAL/PLATELET
BASOS ABS: 0 10*3/uL (ref 0.0–0.1)
Basophils Relative: 0.5 % (ref 0.0–3.0)
EOS PCT: 1.7 % (ref 0.0–5.0)
Eosinophils Absolute: 0.1 10*3/uL (ref 0.0–0.7)
HEMATOCRIT: 33.6 % — AB (ref 36.0–46.0)
Hemoglobin: 11.2 g/dL — ABNORMAL LOW (ref 12.0–15.0)
LYMPHS PCT: 17.8 % (ref 12.0–46.0)
Lymphs Abs: 1.2 10*3/uL (ref 0.7–4.0)
MCHC: 33.2 g/dL (ref 30.0–36.0)
MCV: 87.8 fl (ref 78.0–100.0)
MONOS PCT: 10.8 % (ref 3.0–12.0)
Monocytes Absolute: 0.8 10*3/uL (ref 0.1–1.0)
NEUTROS ABS: 4.8 10*3/uL (ref 1.4–7.7)
Neutrophils Relative %: 69.2 % (ref 43.0–77.0)
Platelets: 290 10*3/uL (ref 150.0–400.0)
RBC: 3.83 Mil/uL — AB (ref 3.87–5.11)
RDW: 15.8 % — ABNORMAL HIGH (ref 11.5–15.5)
WBC: 6.9 10*3/uL (ref 4.0–10.5)

## 2017-09-25 LAB — HEMOCCULT GUIAC POC 1CARD (OFFICE): Fecal Occult Blood, POC: NEGATIVE

## 2017-09-25 LAB — HEMOGLOBIN A1C: HEMOGLOBIN A1C: 7.7 % — AB (ref 4.6–6.5)

## 2017-09-25 LAB — COMPREHENSIVE METABOLIC PANEL
ALK PHOS: 50 U/L (ref 39–117)
ALT: 13 U/L (ref 0–35)
AST: 19 U/L (ref 0–37)
Albumin: 3.5 g/dL (ref 3.5–5.2)
BUN: 22 mg/dL (ref 6–23)
CALCIUM: 9.2 mg/dL (ref 8.4–10.5)
CO2: 28 mEq/L (ref 19–32)
Chloride: 106 mEq/L (ref 96–112)
Creatinine, Ser: 1.56 mg/dL — ABNORMAL HIGH (ref 0.40–1.20)
GFR: 41.34 mL/min — AB (ref >60.00)
Glucose, Bld: 126 mg/dL — ABNORMAL HIGH (ref 70–99)
Potassium: 3.6 mEq/L (ref 3.5–5.1)
Sodium: 139 mEq/L (ref 135–145)
TOTAL PROTEIN: 7.4 g/dL (ref 6.0–8.3)
Total Bilirubin: 0.7 mg/dL (ref 0.2–1.2)

## 2017-09-25 NOTE — Progress Notes (Signed)
Subjective:  Patient ID: Pamela Collier, female    DOB: [birth year]  Age: 77 y.o. MRN: [id number]  CC: The primary encounter diagnosis was Melena. Diagnoses of Chronic kidney disease (CKD), stage I, Type 2 DM with CKD stage 3 and hypertension ([place]), Normocytic anemia, not due to blood loss, and Osteoporosis, post-menopausal were also pertinent to this visit.  HPI Pamela Collier presents for Evaluation of black stools.  Started on 4 days ago after having severa lloose stools 2 days prior.  She had been visiting family out of town and returned home before developing loose stools. She took several doses of Pepto Bismol .Stools have been solid since Monday,  But black. She denies abdominal pain ,  Nausea, and suprapubic cramping     Outpatient Medications Prior to Visit  Medication Sig Dispense Refill  . acetaminophen (TYLENOL) 500 MG tablet Take 1,000 mg by mouth every 6 (six) hours as needed for mild pain or headache.     . ALPRAZolam (XANAX) 0.25 MG tablet TAKE 1 TABLET BY MOUTH ONCE DAILY 30 tablet 2  . aspirin 81 MG chewable tablet Chew 81 mg by mouth daily.     . B-D UF III MINI PEN NEEDLES 31G X 5 MM MISC USE THREE TIMES A DAY 270 each 3  . BRILINTA 90 MG TABS tablet Take 1 tablet (90 mg total) by mouth 2 (two) times daily. 180 tablet 0  . budesonide-formoterol (SYMBICORT) 160-4.5 MCG/ACT inhaler Inhale 2 puffs into the lungs 2 (two) times daily. 1 Inhaler 0  . docusate sodium (COLACE) 100 MG capsule Take 100 mg by mouth daily as needed for mild constipation. Reported on 11/09/2015    . feeding supplement, GLUCERNA SHAKE, (GLUCERNA SHAKE) LIQD Take 237 mLs by mouth 2 (two) times daily between meals. 60 Can 0  . fluticasone (FLONASE) 50 MCG/ACT nasal spray Place 2 sprays into both nostrils daily as needed for rhinitis.    . furosemide (LASIX) 20 MG tablet Take 1 tablet (20 mg total) by mouth every other day. 30 tablet 0  . glucose blood (ACCU-CHEK AVIVA PLUS) test strip USE TO CHECK  GLUCOSE  THREE TIMES DAILY 300 each 3  . Insulin Lispro Prot & Lispro (HUMALOG MIX 75/25 KWIKPEN) (75-25) 100 UNIT/ML Kwikpen Inject 45 units in the morning and 40 units in the evening (Patient taking differently: Inject 40 units with breakfast and 40 units in with supper) 15 mL 11  . isosorbide mononitrate (IMDUR) 30 MG 24 hr tablet TAKE 1 TABLET DAILY 90 tablet 1  . losartan (COZAAR) 100 MG tablet TAKE 1 TABLET DAILY 90 tablet 1  . LYRICA 100 MG capsule TAKE 1 CAPSULE BY MOUTH THREE TIMES DAILY (Patient taking differently: Take 1 capsule 2 times daily) 90 capsule 5  . metoprolol succinate (TOPROL-XL) 25 MG 24 hr tablet Take 1 tablet (25 mg total) by mouth daily. 90 tablet 0  . nitroGLYCERIN (NITROLINGUAL) 0.4 MG/SPRAY spray Place 1 spray under the tongue every 5 (five) minutes x 3 doses as needed for chest pain. 12 g 12  . omeprazole (PRILOSEC) 40 MG capsule Take 1 capsule (40 mg total) by mouth daily. 30 capsule 3  . pantoprazole (PROTONIX) 40 MG tablet Take 40 mg by mouth daily. Patient reports taking once daily    . rosuvastatin (CRESTOR) 20 MG tablet TAKE ONE TABLET BY MOUTH ONCE DAILY 90 tablet 1   Facility-Administered Medications Prior to Visit  Medication Dose Route Frequency Provider Last Rate Last Dose  .  albuterol (PROVENTIL) (5 MG/ML) 0.5% nebulizer solution 2.5 mg  2.5 mg Nebulization Once [name], MD        Review of Systems;  Patient denies headache, fevers, malaise, unintentional weight loss, skin rash, eye pain, sinus congestion and sinus pain, sore throat, dysphagia,  hemoptysis , cough, dyspnea, wheezing, chest pain, palpitations, orthopnea, edema, abdominal pain, nausea,  constipation, flank pain, dysuria, hematuria, urinary  Frequency, nocturia, numbness, tingling, seizures,  Focal weakness, Loss of consciousness,  Tremor, insomnia, depression, anxiety, and suicidal ideation.      Objective:  BP (!) 130/58 (BP Location: Left Arm, Patient Position: Sitting, Cuff  Size: Normal)   Pulse 65   Temp 98.6 F (37 C) (Oral)   Resp 16   Ht 5\' 7"  (1.702 m)   Wt 186 lb (84.4 kg)   SpO2 97%   BMI 29.13 kg/m   BP Readings from Last 3 Encounters:  09/25/17 (!) 130/58  09/23/17 120/60  08/25/17 120/68    Wt Readings from Last 3 Encounters:  09/25/17 186 lb (84.4 kg)  09/23/17 182 lb (82.6 kg)  08/25/17 182 lb (82.6 kg)    General appearance: alert, cooperative and appears stated age Ears: normal TM's and external ear canals both ears Throat: lips, mucosa, and tongue normal; teeth and gums normal Neck: no adenopathy, no carotid bruit, supple, symmetrical, trachea midline and thyroid not enlarged, symmetric, no tenderness/mass/nodules Back: symmetric, no curvature. ROM normal. No CVA tenderness. Lungs: clear to auscultation bilaterally Heart: regular rate and rhythm, S1, S2 normal, no murmur, click, rub or gallop Abdomen: soft, non-tender; bowel sounds normal; no masses,  no organomegaly Pulses: 2+ and symmetric Rectal:  Rectal exam nontender,  Black stool in vault,  Hemoccult negative Skin: Skin color, texture, turgor normal. No rashes or lesions Lymph nodes: Cervical, supraclavicular, and axillary nodes normal.  Lab Results  Component Value Date   HGBA1C 7.7 (H) 09/25/2017   HGBA1C 7.5 (H) 06/08/2017   HGBA1C 7.5 (H) 10/08/2016    Lab Results  Component Value Date   CREATININE 1.56 (H) 09/25/2017   CREATININE 1.48 (H) 06/08/2017   CREATININE 1.35 (H) 06/04/2017    Lab Results  Component Value Date   WBC 6.9 09/25/2017   HGB 11.2 (L) 09/25/2017   HCT 33.6 (L) 09/25/2017   PLT 290.0 09/25/2017   GLUCOSE 126 (H) 09/25/2017   CHOL 124 11/15/2016   TRIG 175 (H) 11/15/2016   HDL 29 (L) 11/15/2016   LDLDIRECT 102.0 03/14/2016   LDLCALC 60 11/15/2016   ALT 13 09/25/2017   AST 19 09/25/2017   NA 139 09/25/2017   K 3.6 09/25/2017   CL 106 09/25/2017   CREATININE 1.56 (H) 09/25/2017   BUN 22 09/25/2017   CO2 28 09/25/2017   TSH  0.953 11/15/2016   INR 1.2 (H) 02/02/2015   HGBA1C 7.7 (H) 09/25/2017   MICROALBUR 28.9 (H) 09/26/2015     Assessment & Plan:   Problem List Items Addressed This Visit    Chronic kidney disease (CKD), stage I   Type 2 DM with CKD stage 3 and hypertension ([place])    She has been taking 40 units of mixed insulin twice daily since her last visit due to recurrent hypoglycemia on 45 unit dose recommended  Gray Bernhardt and her a1c has risen. 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  Hemoccult was negative.  Needs EPO level,  SPEP , Urine IFE and iron stores unless done by Nephrology       Relevant Orders   Iron, TIBC and Ferritin Panel   Protein electrophoresis, serum   IFE AND PE, RANDOM URINE   Melena - Primary    In hte setting of recent Pepto Bismol use.  Hemoccult was done in the office and was negative. She has a mild anemia, normocytic .  Patient reassured that  Her stools were black due to Pepto bismol.  Advised to use Immodium for diarrhea in the future.  Lab Results  Component Value Date   WBC 6.9 09/25/2017   HGB 11.2 (L) 09/25/2017   HCT 33.6 (L) 09/25/2017   MCV 87.8 09/25/2017   PLT 290.0 09/25/2017        Relevant Orders   POCT occult blood stool (Completed)   CBC with Differential/Platelet (Completed)      I am having Pamela Collier maintain her budesonide-formoterol, acetaminophen, docusate sodium, fluticasone, glucose blood, nitroGLYCERIN, BRILINTA, B-D UF III MINI PEN NEEDLES, isosorbide mononitrate, feeding supplement  (GLUCERNA SHAKE), LYRICA, furosemide, rosuvastatin, losartan, ALPRAZolam, aspirin, metoprolol succinate, omeprazole, Insulin Lispro Prot & Lispro, and pantoprazole.  No orders of the defined types were placed in this encounter.   There are no discontinued medications.  Follow-up: No follow-ups on file.   [name], MD

## 2017-09-25 NOTE — Assessment & Plan Note (Addendum)
In hte setting of recent Pepto Bismol use.  Hemoccult was done in the office and was negative. She has a mild anemia, normocytic .  Patient reassured that  Her stools were black due to Pepto bismol.  Advised to use Immodium for diarrhea in the future.  Lab Results  Component Value Date   WBC 6.9 09/25/2017   HGB 11.2 (L) 09/25/2017   HCT 33.6 (L) 09/25/2017   MCV 87.8 09/25/2017   PLT 290.0 09/25/2017

## 2017-09-25 NOTE — Patient Instructions (Addendum)
YOU DO NOT HAVE BLOOD IN YOUR STOOL .  THE COLOR CHANGE WAS FROM  THE PEPTO BISMOL  IMMODIUM CAN BE USED FOR DIARRHEA AND WILL NOT TURN YOUR STOOLS BLACK     YOU HAVE NOT STARTED PROLIA YET FOR YOUR BONES BECAUSE WE ARE STILL TRYING TO GET IT APPROVED BY YOUR INSURANCE

## 2017-09-27 DIAGNOSIS — D649 Anemia, unspecified: Secondary | ICD-10-CM | POA: Insufficient documentation

## 2017-09-27 NOTE — Assessment & Plan Note (Signed)
hgb has dropped slightly since  Pamela Collier visit.  Hemoccult was negative.  Needs EPO level,  SPEP , Urine IFE and iron stores unless done by Nephrology

## 2017-09-27 NOTE — Assessment & Plan Note (Signed)
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

## 2017-09-27 NOTE — Assessment & Plan Note (Signed)
She has been taking 40 units of mixed insulin twice daily since her last visit due to recurrent hypoglycemia on 45 unit dose recommended  Gray Bernhardt and her a1c has risen. She will follow up with Ms Thurmond Butts and bring a log of her blood sugars   Lab Results  Component Value Date   HGBA1C 7.7 (H) 09/25/2017

## 2017-09-28 ENCOUNTER — Telehealth: Payer: Self-pay | Admitting: Internal Medicine

## 2017-09-28 DIAGNOSIS — Z0279 Encounter for issue of other medical certificate: Secondary | ICD-10-CM

## 2017-09-28 NOTE — Telephone Encounter (Signed)
Pt dropped off papers from [employer] to be completed. Pt asks if we could fax or mail out for her.

## 2017-09-28 NOTE — Telephone Encounter (Signed)
Placed forms in red folder.

## 2017-09-29 ENCOUNTER — Telehealth: Payer: Self-pay | Admitting: Internal Medicine

## 2017-09-29 NOTE — Telephone Encounter (Signed)
The form has been completed, but since no information was requested from patient, I did it for the 2 hospitalizations in January that  I assume were relevant.  D/csummaries need to be attached and hopefully they are on printer.  AND  the administrative section still needs filling out $50 FOR COMPLETION

## 2017-09-30 NOTE — Telephone Encounter (Signed)
Spoke with pt and informed her that the paperwork has been completed and mailed out this morning. Pt gave a verbal understanding.

## 2017-10-02 ENCOUNTER — Other Ambulatory Visit: Payer: Self-pay | Admitting: Internal Medicine

## 2017-10-06 ENCOUNTER — Other Ambulatory Visit: Payer: Self-pay | Admitting: *Deleted

## 2017-10-06 ENCOUNTER — Ambulatory Visit: Payer: Self-pay | Admitting: *Deleted

## 2017-10-06 NOTE — Patient Outreach (Addendum)
[facility]) Care Management  10/06/2017  Pamela Collier [birth year] [id number]   Phone call to patient to follow up on completion of paperwork to the [place] to assist with the railing repair in her home. Patient was not in, message left for a return call.   Plan: This Education officer, museum will call patient back within 1 week.   [name] [facility] Care Management [phone]

## 2017-10-07 ENCOUNTER — Ambulatory Visit: Payer: Self-pay | Admitting: *Deleted

## 2017-10-07 DIAGNOSIS — E1122 Type 2 diabetes mellitus with diabetic chronic kidney disease: Secondary | ICD-10-CM | POA: Diagnosis not present

## 2017-10-07 DIAGNOSIS — R809 Proteinuria, unspecified: Secondary | ICD-10-CM | POA: Diagnosis not present

## 2017-10-07 DIAGNOSIS — I129 Hypertensive chronic kidney disease with stage 1 through stage 4 chronic kidney disease, or unspecified chronic kidney disease: Secondary | ICD-10-CM | POA: Diagnosis not present

## 2017-10-07 DIAGNOSIS — N183 Chronic kidney disease, stage 3 (moderate): Secondary | ICD-10-CM | POA: Diagnosis not present

## 2017-10-08 ENCOUNTER — Other Ambulatory Visit: Payer: Self-pay | Admitting: Internal Medicine

## 2017-10-12 ENCOUNTER — Telehealth: Payer: Self-pay

## 2017-10-12 ENCOUNTER — Other Ambulatory Visit: Payer: Self-pay | Admitting: *Deleted

## 2017-10-12 NOTE — Patient Outreach (Addendum)
[place] [facility]) Care Management  10/12/2017  Pamela Collier [birth year] [id number]   Telephone follow up\  Incoming call from patient , she voiced concern regarding pain that she has been having in her legs while resting in bed at night starting 3 t0 4 nights ago, states it does not hurt as much when walking as it does when lying down. Patient discussed taking tylenol with some relief. Patient stating she is not sure what to do, states she has not notified MD of discomfort. Dicussed with patient notifying MD office to arrange appointment , states she will be able to do that.  Patient states she has had a few brief episodes of chest pain that does not stay and has not need to take nitroglycerin.  Patient reports that she is taking her medication as prescribed.  She discussed seeing Kidney doctor on last week and he prescribed medication for her cough that she started taking over the weekend.   Patient reports appetite has been good, she reports her blood sugar this am was 156, reports over last 2 weeks  one episode of blood sugar  being 78, she had 1/2 cup orange juice , at meals and it came up to normal .   Patient reports her weight being 185, discussed going to a few birthday parties and eating a litter more, she denies having any increase in swelling or shortness.    Plan Will follow up routine home visit in this month for assessment of care needs and ongoing follow up for chronic disease management .    [name], RN, [place] Management Coordinator  [phone]- Mobile [phone]- Toll Free Main Office

## 2017-10-12 NOTE — Telephone Encounter (Signed)
Copied from [place] [phone]. Topic: General - Other >> Oct 12, 2017  9:58 AM Oneta Rack wrote: Relation to pt: self Call back number: [phone]   Reason for call:  Patient requesting to see Dr. Derrel Nip only, patient states while sleeping her legs feel restless and patient experiences leg pain which wakes her up during the night, please advise if patient can be squeezed in for an appointment, please advise  >> Oct 12, 2017 10:02 AM Oneta Rack wrote: Relation to pt: self Call back number: [phone]   Reason for call:  Patient requesting to see Dr. Derrel Nip only, patient states while sleeping her legs feel restless and patient experiences leg pain which wakes her up during the night, please advise if patient can be squeezed in for an appointment, please advise   Patient was triaged and states that she feels like her legs are restless and would like to see Dr. Derrel Nip about this issue.She has been scheduled to see Dr. Derrel Nip on 10-14-17.

## 2017-10-13 ENCOUNTER — Ambulatory Visit: Payer: Self-pay | Admitting: *Deleted

## 2017-10-13 ENCOUNTER — Other Ambulatory Visit: Payer: Self-pay

## 2017-10-13 MED ORDER — GLUCOSE BLOOD VI STRP: ORAL_STRIP | 5 refills | Status: DC

## 2017-10-14 ENCOUNTER — Ambulatory Visit (INDEPENDENT_AMBULATORY_CARE_PROVIDER_SITE_OTHER): Payer: Medicare Other | Admitting: Internal Medicine

## 2017-10-14 ENCOUNTER — Encounter: Payer: Self-pay | Admitting: Internal Medicine

## 2017-10-14 ENCOUNTER — Other Ambulatory Visit: Payer: Self-pay | Admitting: *Deleted

## 2017-10-14 DIAGNOSIS — I255 Ischemic cardiomyopathy: Secondary | ICD-10-CM

## 2017-10-14 DIAGNOSIS — M79604 Pain in right leg: Secondary | ICD-10-CM | POA: Diagnosis not present

## 2017-10-14 DIAGNOSIS — E1321 Other specified diabetes mellitus with diabetic nephropathy: Secondary | ICD-10-CM

## 2017-10-14 DIAGNOSIS — Z79899 Other long term (current) drug therapy: Secondary | ICD-10-CM | POA: Diagnosis not present

## 2017-10-14 DIAGNOSIS — D649 Anemia, unspecified: Secondary | ICD-10-CM

## 2017-10-14 DIAGNOSIS — E1121 Type 2 diabetes mellitus with diabetic nephropathy: Secondary | ICD-10-CM | POA: Diagnosis not present

## 2017-10-14 DIAGNOSIS — G2581 Restless legs syndrome: Secondary | ICD-10-CM | POA: Diagnosis not present

## 2017-10-14 DIAGNOSIS — N183 Chronic kidney disease, stage 3 unspecified: Secondary | ICD-10-CM

## 2017-10-14 DIAGNOSIS — E1122 Type 2 diabetes mellitus with diabetic chronic kidney disease: Secondary | ICD-10-CM

## 2017-10-14 DIAGNOSIS — M79605 Pain in left leg: Secondary | ICD-10-CM | POA: Diagnosis not present

## 2017-10-14 DIAGNOSIS — I129 Hypertensive chronic kidney disease with stage 1 through stage 4 chronic kidney disease, or unspecified chronic kidney disease: Secondary | ICD-10-CM | POA: Diagnosis not present

## 2017-10-14 NOTE — Progress Notes (Addendum)
Subjective:  Patient ID: Pamela Collier, female    DOB: 1940/07/26  Age: 77 y.o. 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  HPI Pamela Collier presents for multiple complaints.  1)  restless legs symptoms  That began   A week ago and are keeping her up at night.  Numb and aching from knee to ankle  For the past week. Uses tylenol 2 tablets which makes the pain manageable and able to fall asleep.   Left arm also becoming painful and left hand becomes tight but is relieved with use of a squeezable ball.    Patient just seen May 17 for "black stools" (heme negative during exam, had been [employer] for loose stools)   patient has  Been using a 12 hour  pill box to manage her medications for 2 weeks at a time .    " I don't know what is wrong with me" did not call back after last visit to schedule labs   Lab Results  Component Value Date   IRON 39 (L) 10/14/2017   TIBC 281 10/14/2017   FERRITIN 85 10/14/2017     Outpatient Medications Prior to Visit  Medication Sig Dispense Refill  . acetaminophen (TYLENOL) 500 MG tablet Take 1,000 mg by mouth every 6 (six) hours as needed for mild pain or headache.     . ALPRAZolam (XANAX) 0.25 MG tablet TAKE 1 TABLET BY MOUTH ONCE DAILY 30 tablet 2  . aspirin 81 MG chewable tablet Chew 81 mg by mouth daily.     . B-D UF III MINI PEN NEEDLES 31G X 5 MM MISC USE THREE TIMES A DAY 270 each 3  . BRILINTA 90 MG TABS tablet Take 1 tablet (90 mg total) by mouth 2 (two) times daily. 180 tablet 0  . budesonide-formoterol (SYMBICORT) 160-4.5 MCG/ACT inhaler Inhale 2 puffs into the lungs 2 (two) times daily. 1 Inhaler 0  . docusate sodium (COLACE) 100 MG  capsule Take 100 mg by mouth daily as needed for mild constipation. Reported on 11/09/2015    . feeding supplement, GLUCERNA SHAKE, (GLUCERNA SHAKE) LIQD Take 237 mLs by mouth 2 (two) times daily between meals. 60 Can 0  . fluticasone (FLONASE) 50 MCG/ACT nasal spray Place 2 sprays into both nostrils daily as needed for rhinitis.    . furosemide (LASIX) 20 MG tablet Take 1 tablet (20 mg total) by mouth every other day. 30 tablet 0  . glucose blood (ACCU-CHEK AVIVA PLUS) test strip USE TO CHECK GLUCOSE THREE TIMES DAILY 200 each 5  . Insulin Lispro Prot & Lispro (HUMALOG MIX 75/25 KWIKPEN) (75-25) 100 UNIT/ML Kwikpen Inject 45 units in the morning and 40 units in the evening (Patient taking differently: Inject 40 units with breakfast and 40 units in with supper) 15 mL 11  . isosorbide mononitrate (IMDUR) 30 MG 24 hr tablet TAKE 1 TABLET DAILY 90 tablet 1  . losartan (COZAAR) 100 MG tablet TAKE 1 TABLET DAILY 90 tablet 1  . LYRICA 100 MG capsule TAKE 1 CAPSULE BY MOUTH THREE TIMES DAILY (Patient taking differently: Take 1 capsule 2 times daily) 90 capsule 5  . metoprolol succinate (TOPROL-XL) 25 MG 24 hr tablet Take 1 tablet (  25 mg total) by mouth daily. 90 tablet 0  . nitroGLYCERIN (NITROLINGUAL) 0.4 MG/SPRAY spray Place 1 spray under the tongue every 5 (five) minutes x 3 doses as needed for chest pain. 12 g 12  . omeprazole (PRILOSEC) 40 MG capsule Take 1 capsule (40 mg total) by mouth daily. 30 capsule 3  . pantoprazole (PROTONIX) 40 MG tablet Take 40 mg by mouth daily. Patient reports taking once daily    . rosuvastatin (CRESTOR) 20 MG tablet TAKE ONE TABLET BY MOUTH ONCE DAILY 90 tablet 1   Facility-Administered Medications Prior to Visit  Medication Dose Route Frequency Provider Last Rate Last Dose  . albuterol (PROVENTIL) (5 MG/ML) 0.5% nebulizer solution 2.5 mg  2.5 mg Nebulization Once [name], MD        Review of Systems;  Patient denies headache, fevers, malaise, unintentional  weight loss, skin rash, eye pain, sinus congestion and sinus pain, sore throat, dysphagia,  hemoptysis , cough, dyspnea, wheezing, chest pain, palpitations, orthopnea, edema, abdominal pain, nausea, melena, diarrhea, constipation, flank pain, dysuria, hematuria, urinary  Frequency, nocturia, numbness, tingling, seizures,  Focal weakness, Loss of consciousness,  Tremor, insomnia, depression, anxiety, and suicidal ideation.      Objective:  BP (!) 116/58 (BP Location: Left Arm, Patient Position: Sitting, Cuff Size: Normal)   Pulse 65   Temp 98.2 F (36.8 C) (Oral)   Resp 15   Ht 5' 7"  (1.702 m)   Wt 187 lb 9.6 oz (85.1 kg)   SpO2 95%   BMI 29.38 kg/m   BP Readings from Last 3 Encounters:  10/14/17 (!) 116/58  09/25/17 (!) 130/58  09/23/17 120/60    Wt Readings from Last 3 Encounters:  10/14/17 187 lb 9.6 oz (85.1 kg)  09/25/17 186 lb (84.4 kg)  09/23/17 182 lb (82.6 kg)    General appearance: alert, cooperative and appears stated age Ears: normal TM's and external ear canals both ears Throat: lips, mucosa, and tongue normal; teeth and gums normal Neck: no adenopathy, no carotid bruit, supple, symmetrical, trachea midline and thyroid not enlarged, symmetric, no tenderness/mass/nodules Back: symmetric, no curvature. ROM normal. No CVA tenderness. Lungs: clear to auscultation bilaterally Heart: regular rate and rhythm, S1, S2 normal, no murmur, click, rub or gallop Abdomen: soft, non-tender; bowel sounds normal; no masses,  no organomegaly Pulses: 2+ and symmetric Skin: Skin color, texture, turgor normal. No rashes or lesions Lymph nodes: Cervical, supraclavicular, and axillary nodes normal.  Lab Results  Component Value Date   HGBA1C 7.7 (H) 09/25/2017   HGBA1C 7.5 (H) 06/08/2017   HGBA1C 7.5 (H) 10/08/2016    Lab Results  Component Value Date   CREATININE 1.56 (H) 09/25/2017   CREATININE 1.48 (H) 06/08/2017   CREATININE 1.35 (H) 06/04/2017    Lab Results  Component  Value Date   WBC 6.9 09/25/2017   HGB 11.2 (L) 09/25/2017   HCT 33.6 (L) 09/25/2017   PLT 290.0 09/25/2017   GLUCOSE 126 (H) 09/25/2017   CHOL 124 11/15/2016   TRIG 175 (H) 11/15/2016   HDL 29 (L) 11/15/2016   LDLDIRECT 102.0 03/14/2016   LDLCALC 60 11/15/2016   ALT 13 09/25/2017   AST 19 09/25/2017   NA 139 09/25/2017   K 3.6 09/25/2017   CL 106 09/25/2017   CREATININE 1.56 (H) 09/25/2017   BUN 22 09/25/2017   CO2 28 09/25/2017   TSH 0.953 11/15/2016   INR 1.2 (H) 02/02/2015   HGBA1C 7.7 (H) 09/25/2017   MICROALBUR 28.9 (  H) 09/26/2015    Mm Digital Screening Bilateral  Result Date: 09/07/2017 CLINICAL DATA:  Screening. EXAM: DIGITAL SCREENING BILATERAL MAMMOGRAM WITH CAD COMPARISON:  Previous exam(s). ACR Breast Density Category b: There are scattered areas of fibroglandular density. FINDINGS: There are no findings suspicious for malignancy. Images were processed with CAD. IMPRESSION: No mammographic evidence of malignancy. A result letter of this screening mammogram will be mailed directly to the patient. RECOMMENDATION: Screening mammogram in one year. (Code:SM-B-01Y) BI-RADS CATEGORY  1: Negative. Electronically Signed   By: Fidela Salisbury M.D.   On: 09/07/2017 13:01    Assessment & Plan:   Problem List Items Addressed This Visit    Type 2 DM with CKD stage 3 and hypertension ([place])    She has been taking 40 units of mixed insulin twice daily since her last visit due to recurrent hypoglycemia on 45 unit dose recommended  Gray Bernhardt and her a1c has risen. She will follow up with Ms Thurmond Butts and bring a log of her blood sugars   Lab Results  Component Value Date   HGBA1C 7.7 (H) 09/25/2017         Polypharmacy    She remains confused about her medications but states she is using a pill box .  Encouraged her to rquest assistance from [facility] RN>       Leg pain    Unclear if she is experiencing diabetic neuropathy , RLS or OA. , iron deficiency ruled out.  Continue  tylenol for now   Lab Results  Component Value Date   IRON 39 (L) 10/14/2017   TIBC 281 10/14/2017   FERRITIN 85 10/14/2017         Diabetic nephropathy associated with type 2 diabetes mellitus ([place])    Continue losartan and management of hypertension with goal bp 120/70 met. proteinuria monitored by nephrology        Restless legs    Iron deficiency ruled out.  Trial f Requip 0.25 mg qhs   Lab Results  Component Value Date   IRON 39 (L) 10/14/2017   TIBC 281 10/14/2017   FERRITIN 85 10/14/2017         Normocytic anemia, not due to blood loss    Iron deficiency and multiple myeloma ruled out. Her anemia is due to CKD   Lab Results  Component Value Date   WBC 6.9 09/25/2017   HGB 11.2 (L) 09/25/2017   HCT 33.6 (L) 09/25/2017   MCV 87.8 09/25/2017   PLT 290.0 09/25/2017           A total of 25 minutes of face to face time was spent with patient more than half of which was spent in counselling about the above mentioned conditions  and coordination of care  I am having Pamela Collier maintain her budesonide-formoterol, acetaminophen, docusate sodium, fluticasone, nitroGLYCERIN, BRILINTA, B-D UF III MINI PEN NEEDLES, feeding supplement (GLUCERNA SHAKE), LYRICA, furosemide, rosuvastatin, losartan, ALPRAZolam, aspirin, metoprolol succinate, omeprazole, Insulin Lispro Prot & Lispro, pantoprazole, isosorbide mononitrate, and glucose blood.  No orders of the defined types were placed in this encounter.   There are no discontinued medications.  Follow-up: No follow-ups on file.   [name], MD

## 2017-10-14 NOTE — Patient Instructions (Addendum)
Your leg pain may be be coming from plain  old arthritis .  If 2 tylenol reduce to the pain to a tolerable level,  Take them before you go to bed every night   Lab work today to rule out iron deficiency as a cause for your anemia  Ask the  [facility] nurse to help you with your pill box

## 2017-10-14 NOTE — Addendum Note (Signed)
Addended by: Arby Barrette on: 10/14/2017 04:37 PM   Modules accepted: Orders

## 2017-10-14 NOTE — Patient Outreach (Signed)
[facility]) Care Management  10/14/2017  Pamela Collier 03-15-1941 [id number]      Phone call to patient to follow up on completion of paperwork to the [place] to assist with the railing repair in her home. Patient was not in, message left for a return call.   Plan: This Education officer, museum will call patient back within 1 week.    [name] [facility] Care Management [phone]

## 2017-10-15 DIAGNOSIS — M79606 Pain in leg, unspecified: Secondary | ICD-10-CM | POA: Insufficient documentation

## 2017-10-15 NOTE — Assessment & Plan Note (Signed)
She remains confused about her medications but states she is using a pill box .  Encouraged her to rquest assistance from [facility] RN>

## 2017-10-15 NOTE — Assessment & Plan Note (Addendum)
Continue losartan and management of hypertension with goal bp 120/70 met. proteinuria monitored by nephrology

## 2017-10-15 NOTE — Assessment & Plan Note (Signed)
She has been taking 40 units of mixed insulin twice daily since her last visit due to recurrent hypoglycemia on 45 unit dose recommended  Gray Bernhardt and her a1c has risen. She will follow up with Ms Thurmond Butts and bring a log of her blood sugars   Lab Results  Component Value Date   HGBA1C 7.7 (H) 09/25/2017

## 2017-10-15 NOTE — Assessment & Plan Note (Signed)
Unclear if she is experiencing diabetic neuropathy , RLS or OA. , iron deficiency ruled out.  Continue tylenol for now   Lab Results  Component Value Date   IRON 39 (L) 10/14/2017   TIBC 281 10/14/2017   FERRITIN 85 10/14/2017

## 2017-10-16 ENCOUNTER — Other Ambulatory Visit: Payer: Self-pay | Admitting: *Deleted

## 2017-10-16 ENCOUNTER — Ambulatory Visit: Payer: Self-pay | Admitting: *Deleted

## 2017-10-16 LAB — IRON,TIBC AND FERRITIN PANEL
%SAT: 14 % (calc) (ref 11–50)
Ferritin: 85 ng/mL (ref 20–288)
IRON: 39 ug/dL — AB (ref 45–160)
TIBC: 281 mcg/dL (calc) (ref 250–450)

## 2017-10-16 LAB — PROTEIN ELECTROPHORESIS, SERUM
ALBUMIN ELP: 3.4 g/dL — AB (ref 3.8–4.8)
ALPHA 1: 0.4 g/dL — AB (ref 0.2–0.3)
Alpha 2: 1 g/dL — ABNORMAL HIGH (ref 0.5–0.9)
Beta 2: 0.6 g/dL — ABNORMAL HIGH (ref 0.2–0.5)
Beta Globulin: 0.4 g/dL (ref 0.4–0.6)
GAMMA GLOBULIN: 1.3 g/dL (ref 0.8–1.7)
Total Protein: 7.1 g/dL (ref 6.1–8.1)

## 2017-10-16 LAB — IFE AND PE, RANDOM URINE
% BETA, Urine: 18 %
ALBUMIN, U: 57.7 %
ALPHA 1 URINE: 6.3 %
ALPHA-2-GLOBULIN, U: 7 %
GAMMA GLOBULIN URINE: 11 %
Protein, Ur: 39.9 mg/dL

## 2017-10-16 LAB — MICROALBUMIN / CREATININE URINE RATIO
CREATININE, UR: 59.2 mg/dL
MICROALBUM., U, RANDOM: 154 ug/mL
Microalb/Creat Ratio: 260.1 mg/g creat — ABNORMAL HIGH (ref 0.0–30.0)

## 2017-10-16 NOTE — Patient Outreach (Signed)
[facility]) Care Management  10/16/2017  Pamela Collier November 11, 1940 [id number]   Phone call to patient to follow up on completion of paperwork to the [place] to assist with the railing repair in her home. Patient was notin, message left for a return call.   Plan: This Education officer, museum will call patient back within 1 week    [name] [facility] Care Management [phone]

## 2017-10-17 ENCOUNTER — Other Ambulatory Visit: Payer: Self-pay | Admitting: Internal Medicine

## 2017-10-17 ENCOUNTER — Encounter: Payer: Self-pay | Admitting: Internal Medicine

## 2017-10-17 DIAGNOSIS — G2581 Restless legs syndrome: Secondary | ICD-10-CM | POA: Insufficient documentation

## 2017-10-17 MED ORDER — ROPINIROLE HCL 0.25 MG PO TABS: 0.2500 mg | ORAL_TABLET | Freq: Every day | ORAL | 0 refills | Status: DC

## 2017-10-17 NOTE — Assessment & Plan Note (Signed)
Iron deficiency ruled out.  Trial f Requip 0.25 mg qhs   Lab Results  Component Value Date   IRON 39 (L) 10/14/2017   TIBC 281 10/14/2017   FERRITIN 85 10/14/2017

## 2017-10-17 NOTE — Addendum Note (Signed)
Addended by: [name] on: 10/17/2017 12:37 PM   Modules accepted: Level of Service

## 2017-10-17 NOTE — Assessment & Plan Note (Addendum)
Iron deficiency and multiple myeloma ruled out. Her anemia is due to CKD   Lab Results  Component Value Date   WBC 6.9 09/25/2017   HGB 11.2 (L) 09/25/2017   HCT 33.6 (L) 09/25/2017   MCV 87.8 09/25/2017   PLT 290.0 09/25/2017

## 2017-10-18 IMAGING — CT CT ANGIO CHEST
2 of 6 series · 18 of 46 positions shown · IV contrast (APPLIED)
Comparison: CT scan of January 11, 2015.

CLINICAL DATA: Chest pain and shortness of breath.

EXAM:
CT ANGIOGRAPHY CHEST WITH CONTRAST
TECHNIQUE: Multidetector CT imaging of the chest was performed using the
standard protocol during bolus administration of intravenous
contrast. Multiplanar CT image reconstructions and MIPs were
obtained to evaluate the vascular anatomy.
CONTRAST:  75mL OMNIPAQUE IOHEXOL 350 MG/ML SOLN

[Series 6: pe thins 1.5 · axial · 0.68mm/px · z∈[-280,-30]mm · 15 of 234 slices shown]
[im 13/234  lung]
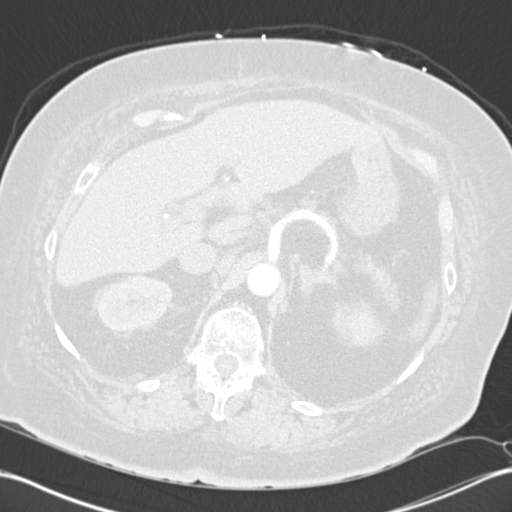
[im 25/234  soft-tissue]
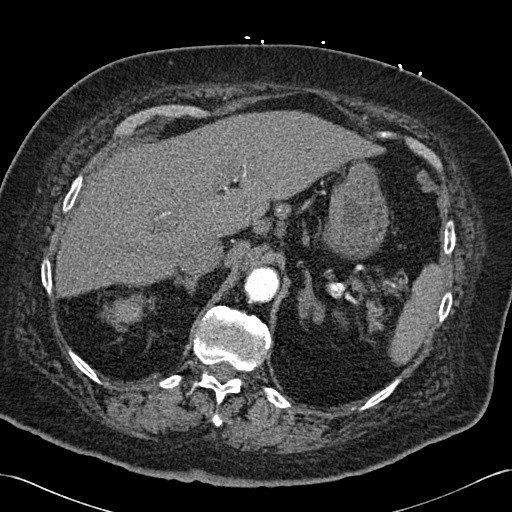
[im 50/234  lung]
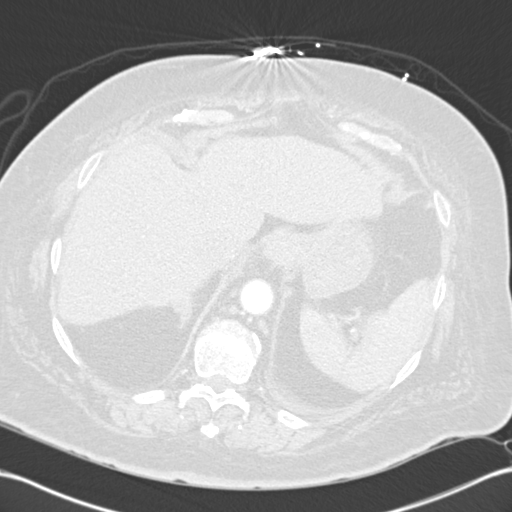
[im 62/234  soft-tissue]
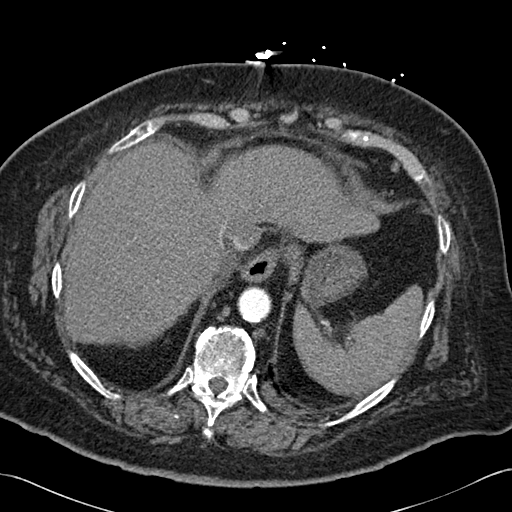
[im 74/234  lung]
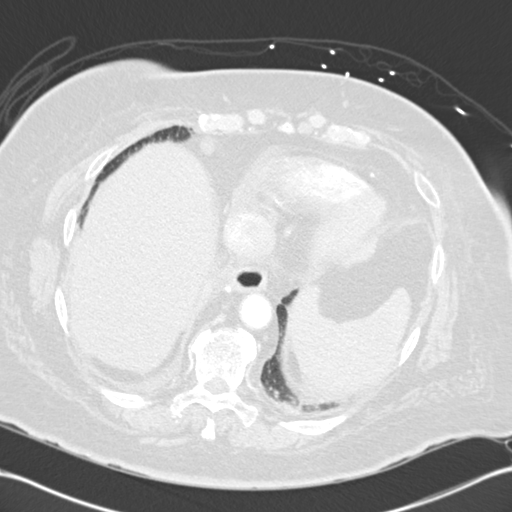
[im 86/234  soft-tissue]
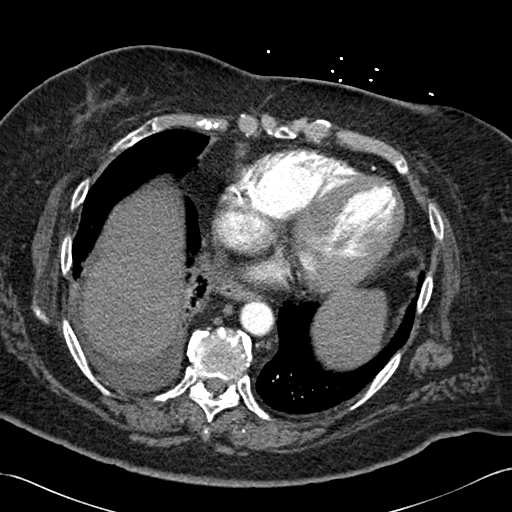
[im 99/234  lung]
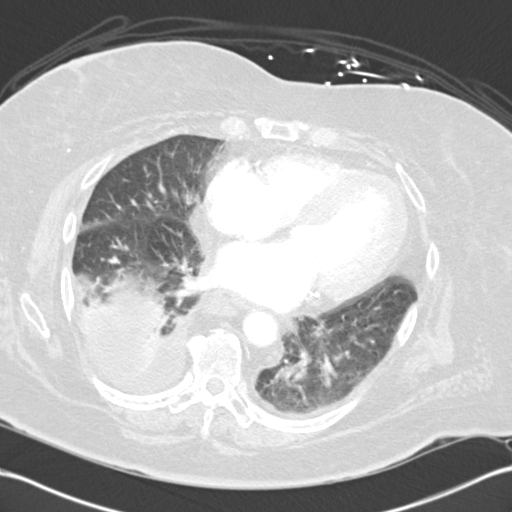
[im 123/234  soft-tissue]
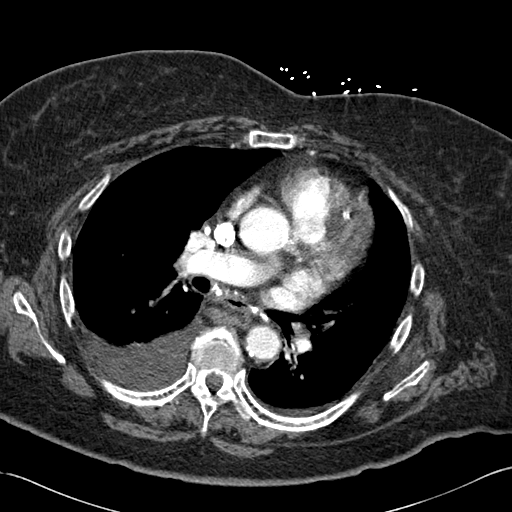
[im 135/234  lung]
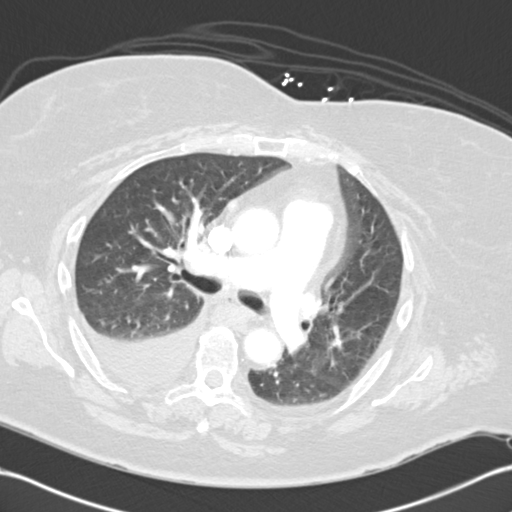
[im 148/234  soft-tissue]
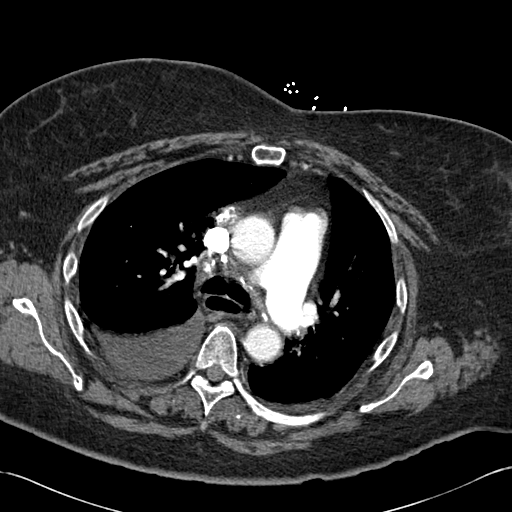
[im 160/234  lung]
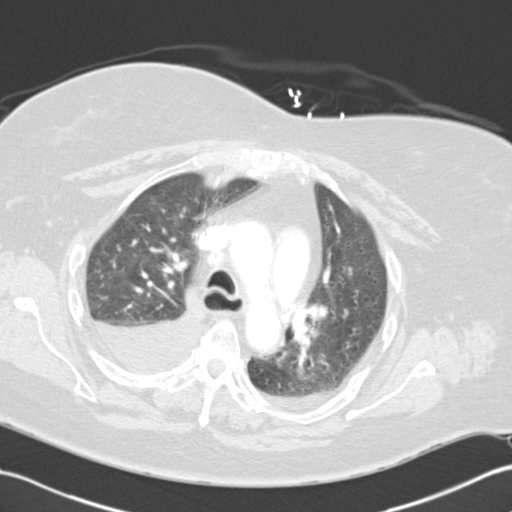
[im 172/234  soft-tissue]
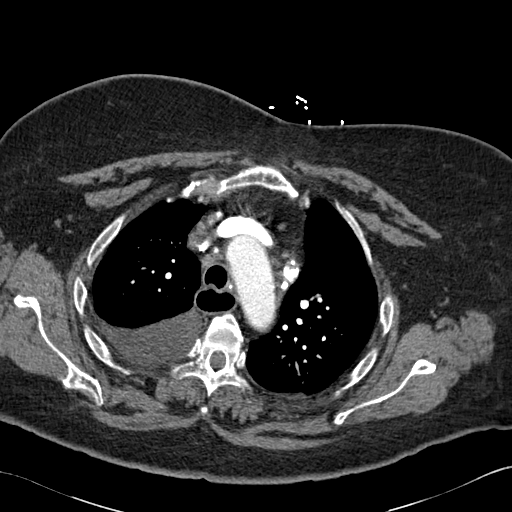
[im 197/234  lung]
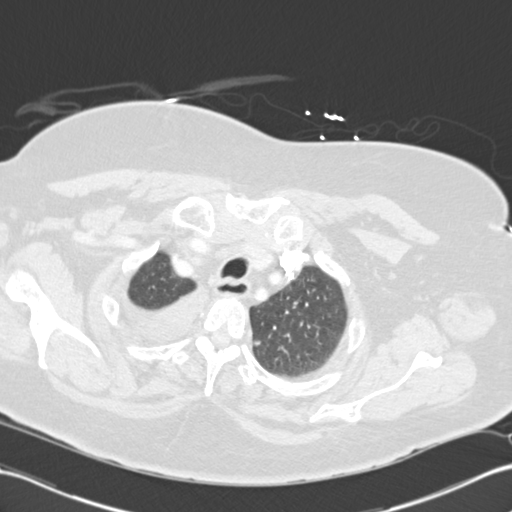
[im 209/234  soft-tissue]
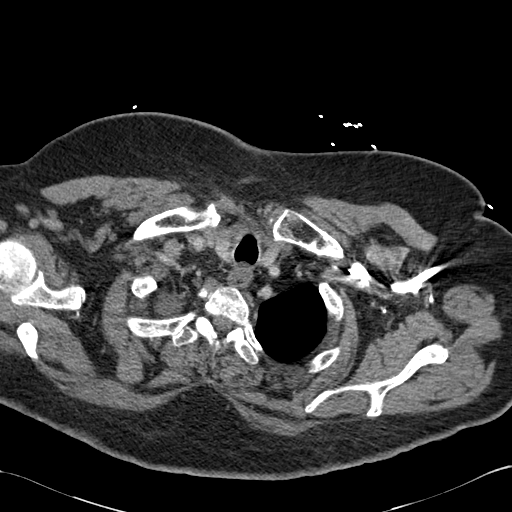
[im 221/234  lung]
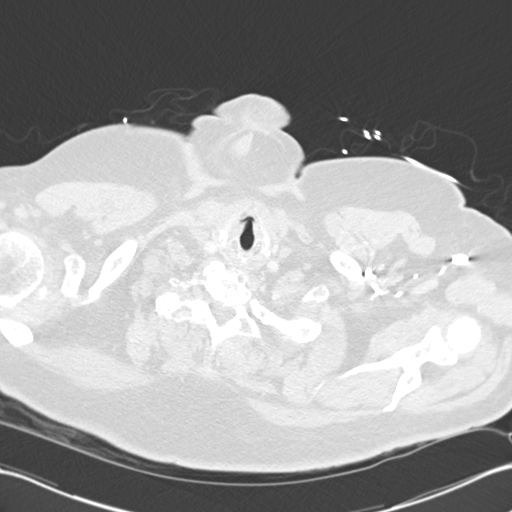

[Series 8: cor mpr 2.0 · coronal · 0.57mm/px · 3 of 135 slices shown]
[im 34/135  soft-tissue]
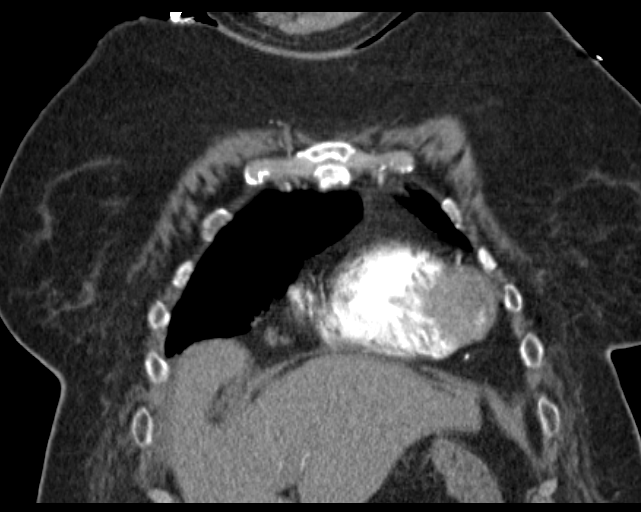
[im 68/135  soft-tissue]
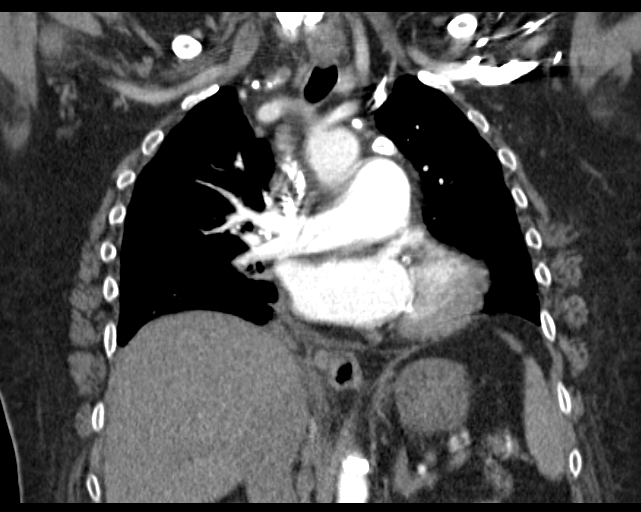
[im 101/135  soft-tissue]
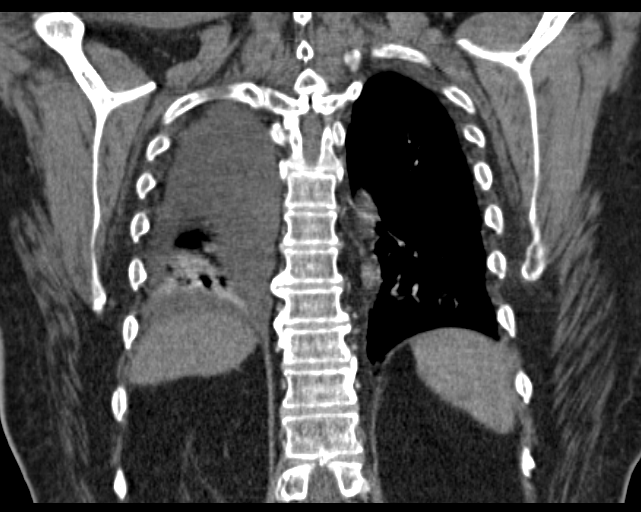

[18 of 46 positions shown; findings below may reference images not displayed]

FINDINGS: No pneumothorax is noted. Moderate right pleural effusion is noted
with adjacent subsegmental atelectasis in the right lower lobe.
Stable 5.5 mm nodule seen in inferior portion of the lingula best
seen on image number 77 of series 7. There is no evidence of large
central pulmonary embolus. However, due to respiratory motion
artifact, evaluation of the more peripheral branches in the lower
lobes is limited, and smaller emboli cannot be excluded.
Atherosclerosis of thoracic aorta is noted without aneurysm or
dissection. Stable mediastinal adenopathy is noted, most likely
reactive or inflammatory in etiology. Adenopathy is also seen in the
visualized portion of the upper abdomen which was present on prior
exam is well.

Review of the MIP images confirms the above findings.
IMPRESSION: No evidence of large central pulmonary embolus. However, due to
respiratory motion artifact, evaluation of the lower lobe branches
bilaterally is limited, and therefore smaller peripheral pulmonary
emboli cannot be excluded on the basis of this exam.

Moderate right pleural effusion is noted with adjacent subsegmental
atelectasis seen in the right lung base.

Stable adenopathy is noted in mediastinum and upper abdomen which
may be related to sarcoidosis, or other inflammation.

Stable 5.5 mm nodule seen in lingular segment of left lower lobe
compared to prior exam. Followup unenhanced chest CT scan in 12
months is recommended.

## 2017-10-18 IMAGING — CR DG CHEST 1V PORT
1 series · 1 of 1 positions shown · non-contrast
Comparison: 04/18/2015

CLINICAL DATA: Patient woke this morning with wheezing and
difficulty breathing. Left-sided chest pain since yesterday.

EXAM:
PORTABLE CHEST 1 VIEW

[portable]
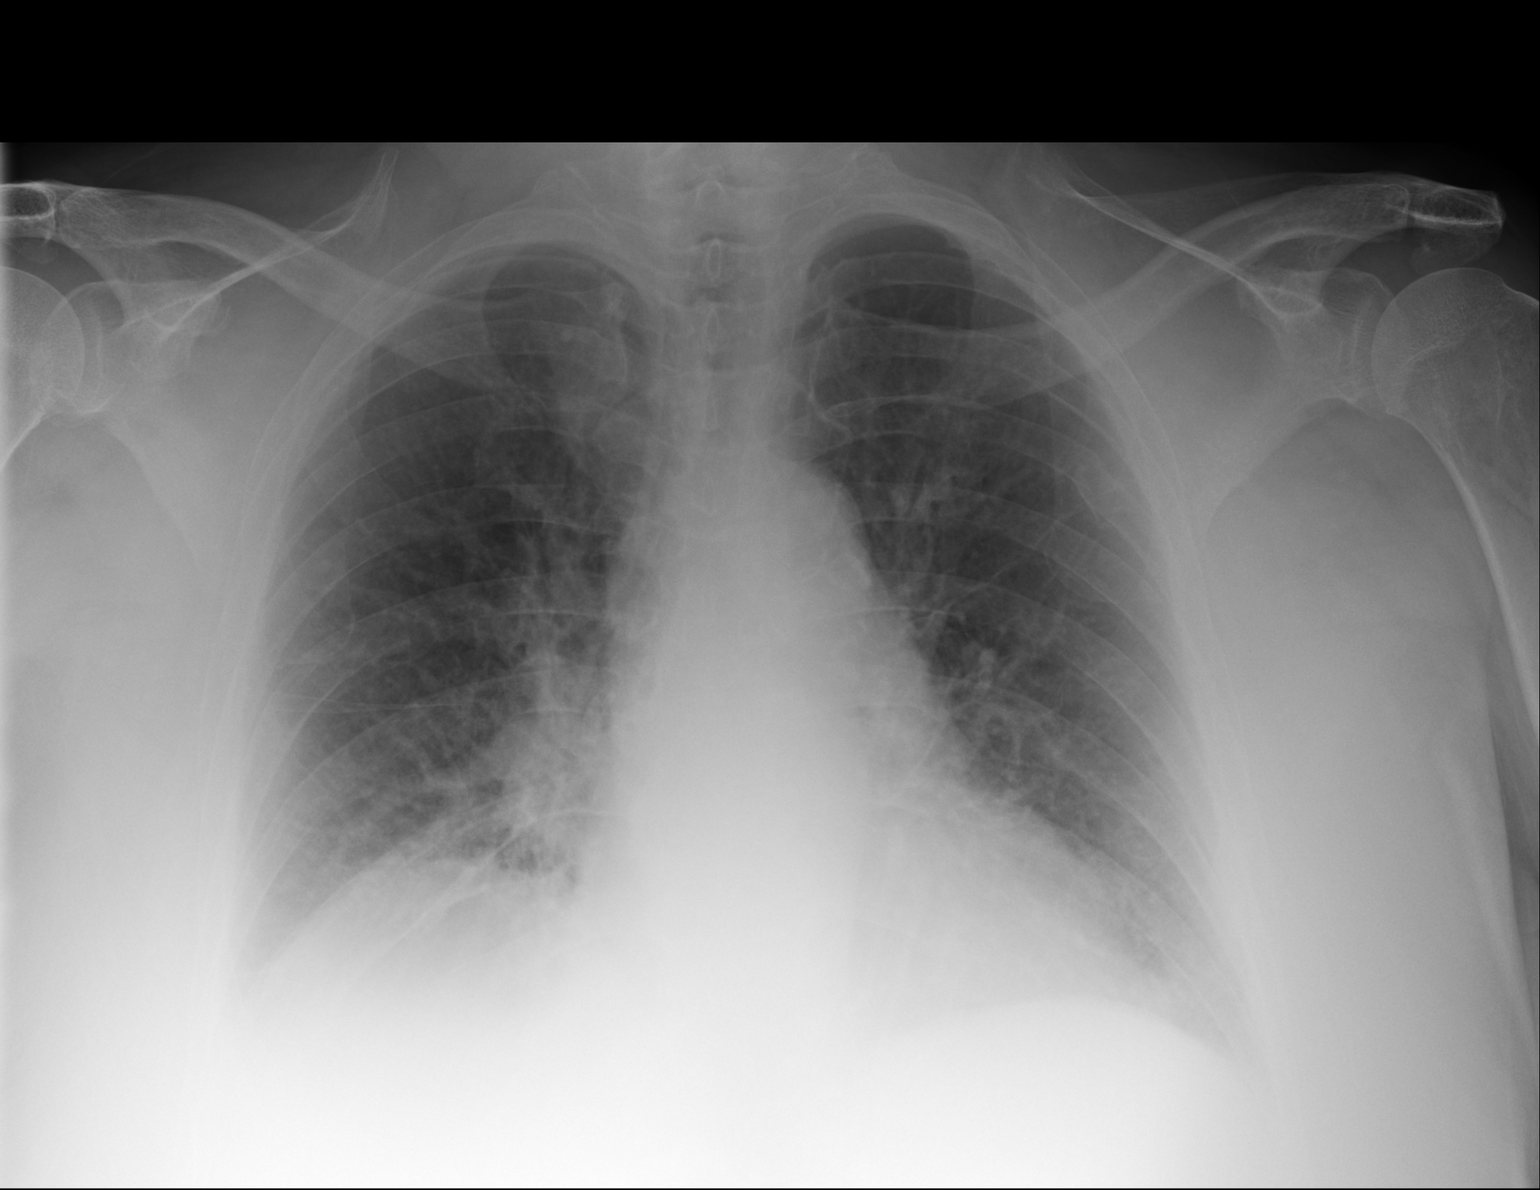

[1 of 1 positions shown; findings below may reference images not displayed]

FINDINGS: Shallow inspiration. Heart size and pulmonary vascularity appear
normal. Since the previous study, there is increased density
inferior to the right hilum. This could represent vascular crowding
due to shallow inspiration, developing mass or infiltration. Given
the history, this could indicate pneumonia. Followup PA and lateral
chest X-ray is recommended in 3-4 weeks following trial of
antibiotic therapy to ensure resolution and exclude underlying
malignancy. Old left rib fractures. No pneumothorax. No blunting of
costophrenic angles there
IMPRESSION: New opacity in the right infrahilar region could represent shallow
inspiration common developing mass, or pneumonia.

## 2017-10-20 DIAGNOSIS — H43313 Vitreous membranes and strands, bilateral: Secondary | ICD-10-CM | POA: Diagnosis not present

## 2017-10-20 DIAGNOSIS — E119 Type 2 diabetes mellitus without complications: Secondary | ICD-10-CM | POA: Diagnosis not present

## 2017-10-20 IMAGING — CR DG CHEST 1V
1 series · 1 of 1 positions shown · non-contrast
Comparison: Film earlier today at 2762 hours

CLINICAL DATA: Status post right thoracentesis.

EXAM:
CHEST 1 VIEW

[dg chest 1 view]
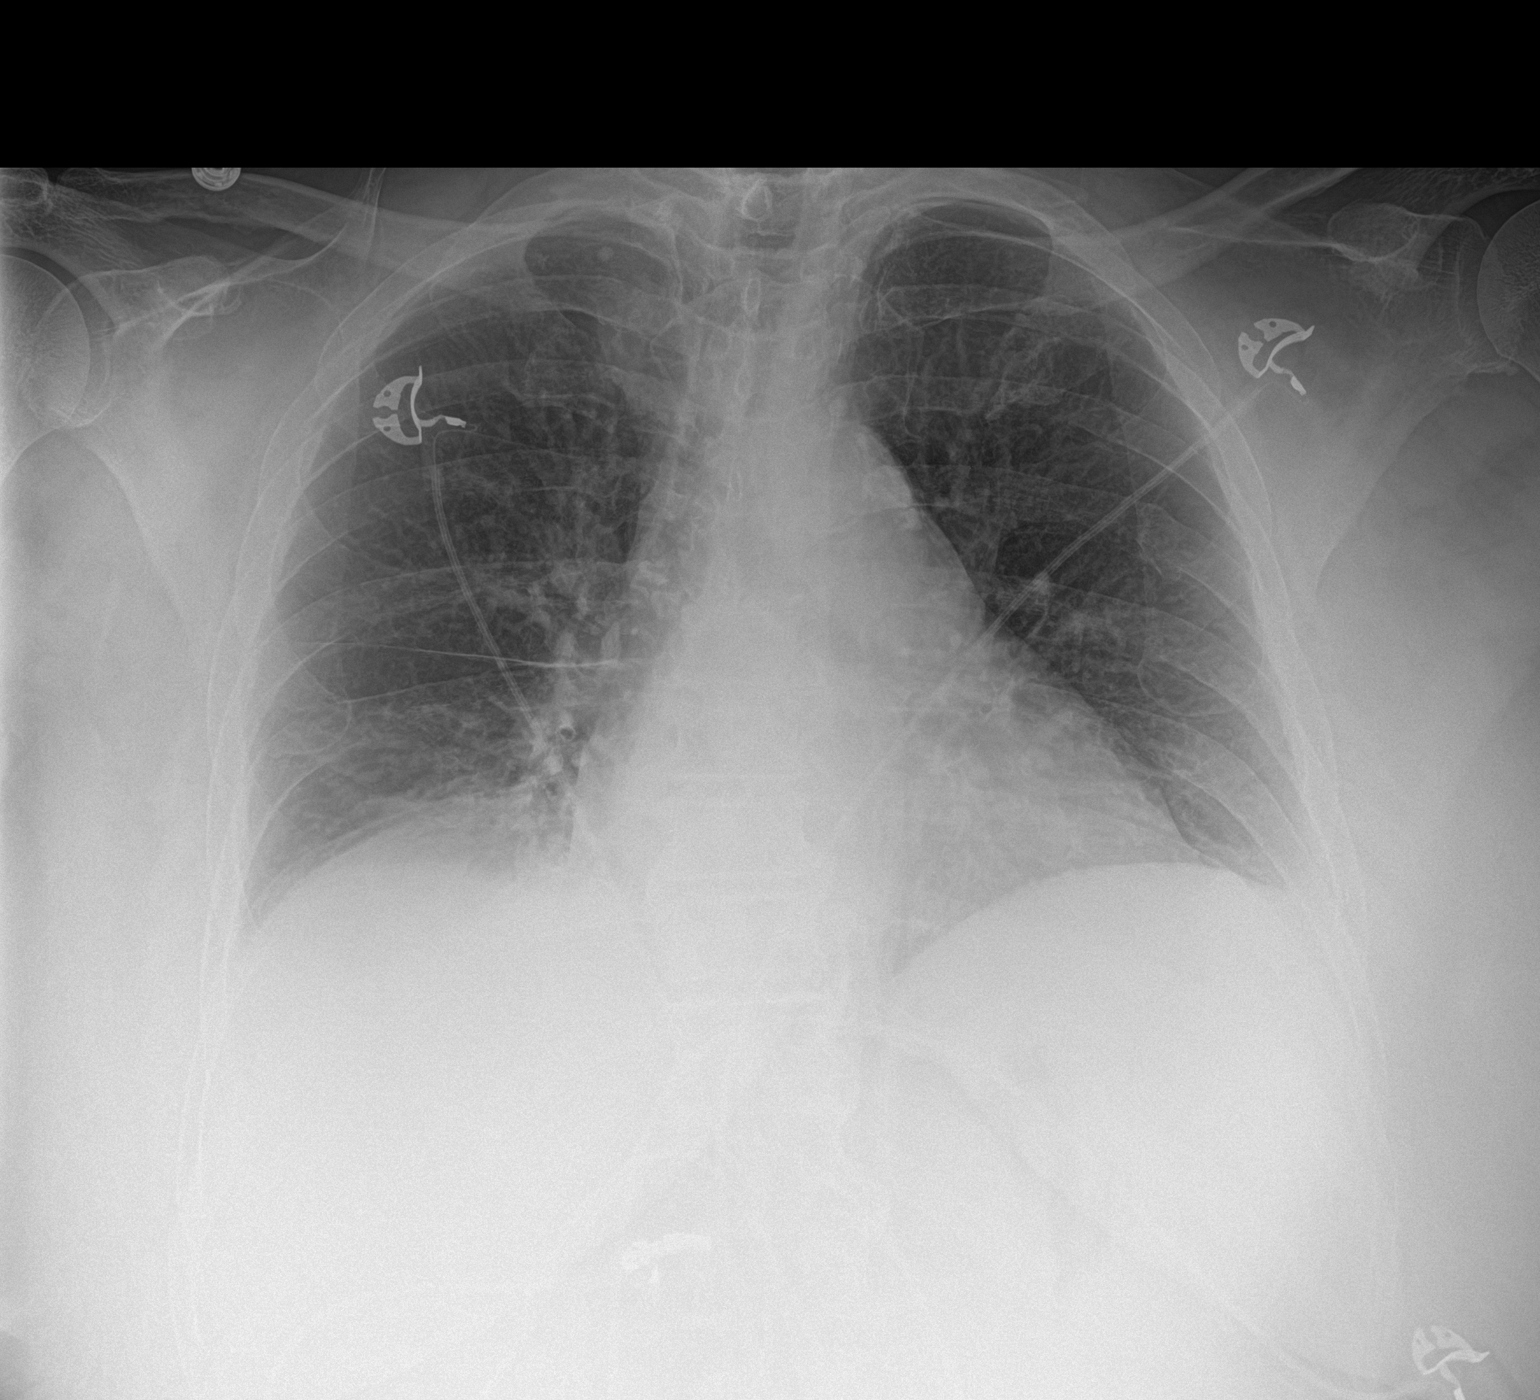

[1 of 1 positions shown; findings below may reference images not displayed]

FINDINGS: There is decrease in right pleural fluid and improved aeration at
the right lung base. No pneumothorax. Bibasilar atelectasis remains.
No pulmonary edema.
IMPRESSION: Decrease in right pleural effusion with improved aeration at the
right lung base. No pneumothorax following thoracentesis.

## 2017-10-20 IMAGING — DX DG CHEST 1V PORT
2 series · 2 of 2 positions shown · non-contrast
Comparison: Portable chest x-ray and CT scan of the chest of 26 June, 2015

CLINICAL DATA: Pneumonia, pleural effusion, status post
thoracentesis, CHF and cardiomyopathy.

EXAM:
PORTABLE CHEST 1 VIEW

[chest ap (1 of 2)]
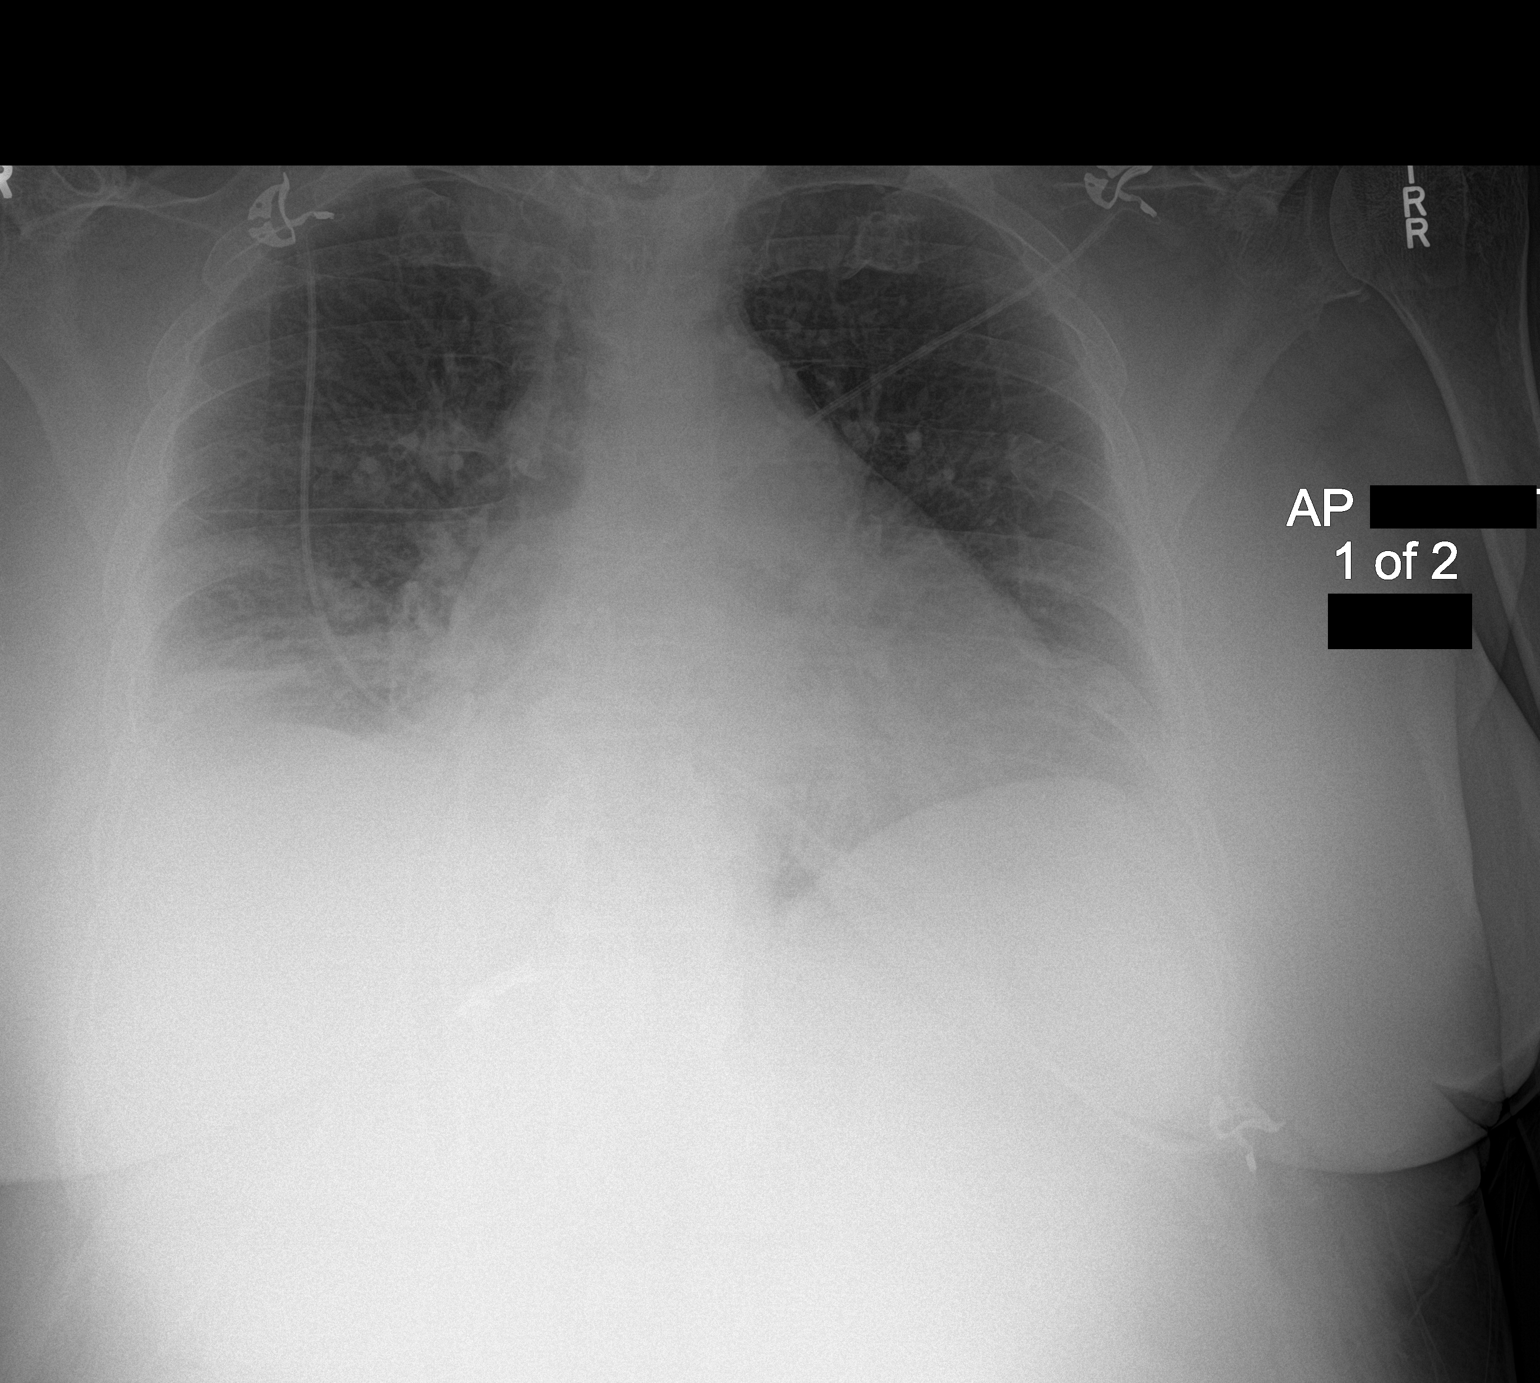

[chest ap (2 of 2)]
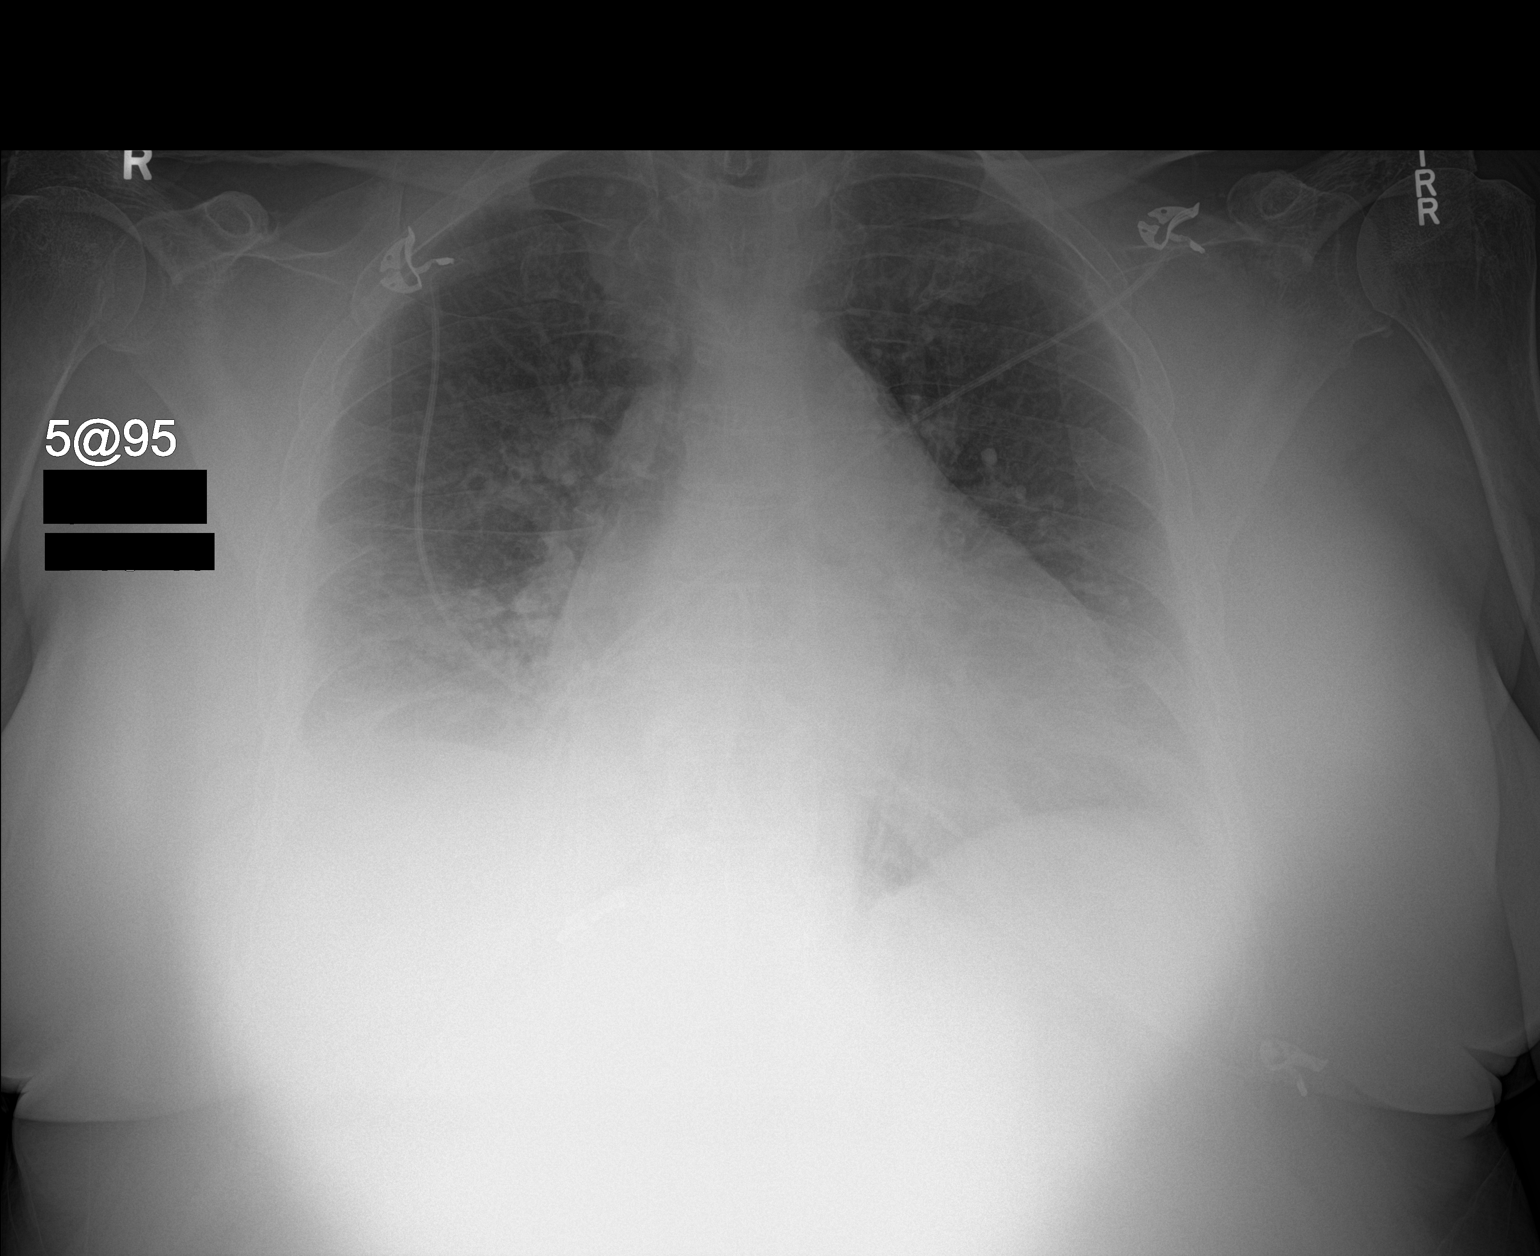

[2 of 2 positions shown; findings below may reference images not displayed]

FINDINGS: The patient is positioned in a somewhat lordotic matter today. The
hemidiaphragms are visible bilaterally. A small amount of pleural
fluid on the right likely persists. The cardiac silhouette is
enlarged. The pulmonary vascularity is prominent centrally. The
pulmonary interstitial markings are only minimally prominent. There
is no pneumothorax. The observed bony thorax is unremarkable.
IMPRESSION: Somewhat limited study due to mild hypo inflation. There is right
basilar subsegmental atelectasis with small pleural effusion. There
is no post thoracentesis pneumothorax.

Cardiomegaly with mild central pulmonary vascular congestion.

## 2017-10-22 ENCOUNTER — Telehealth: Payer: Self-pay | Admitting: Internal Medicine

## 2017-10-22 NOTE — Telephone Encounter (Signed)
Called patient and she states that she started taking requip two days ago and she is still having pain in her legs. The pain radiates from her leg, to her hip and then to her back. No headache, nausea, vomiting, or leg swelling. No complaints of shortness of breath or dizziness just pain in her leg and she would like to know what to do.

## 2017-10-22 NOTE — Telephone Encounter (Signed)
Pt is wanting to know what to do about the pain in her legs. She stated that she started taking requip 2 days ago but she is still having the pain in her legs. She stated that she is not having any SOBr, leg swelling, dizziness, HA, nausea or vomiting.

## 2017-10-22 NOTE — Telephone Encounter (Signed)
Given pain in left leg and left hip and back - would recommend evaluation to see etiology of pain.

## 2017-10-22 NOTE — Telephone Encounter (Signed)
Copied from [place]. Topic: Quick Communication - Rx Refill/Question >> Oct 22, 2017 11:47 AM Boyd Kerbs wrote: Medication:   rOPINIRole (REQUIP) 0.25 MG tablet She is taken this for 2 days and said the pain is going up her left side and back.   Should she keep taking this or what should she do?  She is scared. Please call asap  She call pharmacy and was told to speak to Dr. Derrel Nip  Has the patient contacted their pharmacy? Yes.   (Agent: If no, request that the patient contact the pharmacy for the refill.) (Agent: If yes, when and what did the pharmacy advise?)  Preferred Pharmacy (with phone number or street name):  Agent: Please be advised that RX refills may take up to 3 business days. We ask that you follow-up with your pharmacy.

## 2017-10-23 NOTE — Telephone Encounter (Signed)
Spoke with pt and tried to offer her an appt with someone other than Dr. Derrel Nip because she is out of the office until Monday. Pt stated that she wanted to wait to see Dr. Derrel Nip. Pt has been scheduled for an appt with Dr. Derrel Nip on Tuesday. Pt is aware of appt date and time.

## 2017-10-26 ENCOUNTER — Other Ambulatory Visit: Payer: Self-pay | Admitting: *Deleted

## 2017-10-26 NOTE — Patient Outreach (Signed)
[facility]) Care Management  10/26/2017  Pamela Collier Jun 22, 1940 [id number]    Phone call to patient to follow up on completion of paperwork to the [place] to assist with the railing repair in her home. Patient did not answer, there was no voicemail set up to leave message.  [place] notified of inability to contact. RNCM to discuss contact difficulties during her visit on 10/29/17.     [name] [facility] Care Management [phone]

## 2017-10-27 ENCOUNTER — Ambulatory Visit (INDEPENDENT_AMBULATORY_CARE_PROVIDER_SITE_OTHER): Payer: Medicare Other | Admitting: Internal Medicine

## 2017-10-27 ENCOUNTER — Encounter: Payer: Self-pay | Admitting: Internal Medicine

## 2017-10-27 ENCOUNTER — Other Ambulatory Visit: Payer: Self-pay

## 2017-10-27 ENCOUNTER — Ambulatory Visit (INDEPENDENT_AMBULATORY_CARE_PROVIDER_SITE_OTHER): Payer: Medicare Other

## 2017-10-27 VITALS — BP 122/64 | HR 67 | Temp 98.2°F | Wt 185.8 lb

## 2017-10-27 DIAGNOSIS — M48061 Spinal stenosis, lumbar region without neurogenic claudication: Secondary | ICD-10-CM | POA: Diagnosis not present

## 2017-10-27 DIAGNOSIS — I255 Ischemic cardiomyopathy: Secondary | ICD-10-CM | POA: Diagnosis not present

## 2017-10-27 DIAGNOSIS — M47816 Spondylosis without myelopathy or radiculopathy, lumbar region: Secondary | ICD-10-CM | POA: Diagnosis not present

## 2017-10-27 DIAGNOSIS — M5440 Lumbago with sciatica, unspecified side: Secondary | ICD-10-CM | POA: Diagnosis not present

## 2017-10-27 DIAGNOSIS — M5410 Radiculopathy, site unspecified: Secondary | ICD-10-CM

## 2017-10-27 DIAGNOSIS — M545 Low back pain, unspecified: Secondary | ICD-10-CM

## 2017-10-27 NOTE — Patient Instructions (Addendum)
Your pain may be coming from arthritis .    You can use  Up to 2000  mg of tylenol every day in divided doses  As follows:    If the tylenol you have  At home is  325 mg tablets,  Then you can have a total of  6 daily  So you  Can  take  2 tablets every 8 hours   If they are 500 mg,  You can have 4 daily ,  Or 2 tablets  every 12 hours   Outpatient Physical therapy referral in process    You can stop the Requip,  Because you do not have restless legs

## 2017-10-27 NOTE — Progress Notes (Signed)
Subjective:  Patient ID: Pamela Collier, female    DOB: [birth year]  Age: 77 y.o. MRN: [id number]  CC: The primary encounter diagnosis was Back pain with radiculopathy. A diagnosis of Low back pain with radiation was also pertinent to this visit.  HPI Pamela Collier presents for evaluation of  intermittent  left sided leg pain that began without fall or injury several weeks ago.  She state that the pain started  In her left foot,  In the  Toes, and  has ascended up the lateral side of her left leg and is accompanied by stiffness of both legs.  She also describes back pain radiates into left hip and left leg.  No recent fall.     When she lies down at night her left leg starts to hurt and feel stiff, and is worse by morning.  Tylenol helps relieve the pain so she can sleep.    She has occasional pain in the toes describd as burning more like cramping,   Outpatient Medications Prior to Visit  Medication Sig Dispense Refill  . acetaminophen (TYLENOL) 500 MG tablet Take 1,000 mg by mouth every 6 (six) hours as needed for mild pain or headache.     . ALPRAZolam (XANAX) 0.25 MG tablet TAKE 1 TABLET BY MOUTH ONCE DAILY 30 tablet 2  . aspirin 81 MG chewable tablet Chew 81 mg by mouth daily.     . B-D UF III MINI PEN NEEDLES 31G X 5 MM MISC USE THREE TIMES A DAY 270 each 3  . BRILINTA 90 MG TABS tablet Take 1 tablet (90 mg total) by mouth 2 (two) times daily. 180 tablet 0  . budesonide-formoterol (SYMBICORT) 160-4.5 MCG/ACT inhaler Inhale 2 puffs into the lungs 2 (two) times daily. 1 Inhaler 0  . docusate sodium (COLACE) 100 MG capsule Take 100 mg by mouth daily as needed for mild constipation. Reported on 11/09/2015    . feeding supplement, GLUCERNA SHAKE, (GLUCERNA SHAKE) LIQD Take 237 mLs by mouth 2 (two) times daily between meals. 60 Can 0  . fluticasone (FLONASE) 50 MCG/ACT nasal spray Place 2 sprays into both nostrils daily as needed for rhinitis.    . furosemide (LASIX) 20 MG tablet Take 1  tablet (20 mg total) by mouth every other day. 30 tablet 0  . glucose blood (ACCU-CHEK AVIVA PLUS) test strip USE TO CHECK GLUCOSE THREE TIMES DAILY 200 each 5  . Insulin Lispro Prot & Lispro (HUMALOG MIX 75/25 KWIKPEN) (75-25) 100 UNIT/ML Kwikpen Inject 45 units in the morning and 40 units in the evening (Patient taking differently: Inject 40 units with breakfast and 40 units in with supper) 15 mL 11  . isosorbide mononitrate (IMDUR) 30 MG 24 hr tablet TAKE 1 TABLET DAILY 90 tablet 1  . losartan (COZAAR) 100 MG tablet TAKE 1 TABLET DAILY 90 tablet 1  . LYRICA 100 MG capsule TAKE 1 CAPSULE BY MOUTH THREE TIMES DAILY (Patient taking differently: Take 1 capsule 2 times daily) 90 capsule 5  . metoprolol succinate (TOPROL-XL) 25 MG 24 hr tablet Take 1 tablet (25 mg total) by mouth daily. 90 tablet 0  . nitroGLYCERIN (NITROLINGUAL) 0.4 MG/SPRAY spray Place 1 spray under the tongue every 5 (five) minutes x 3 doses as needed for chest pain. 12 g 12  . omeprazole (PRILOSEC) 40 MG capsule Take 1 capsule (40 mg total) by mouth daily. 30 capsule 3  . pantoprazole (PROTONIX) 40 MG tablet Take 40 mg by mouth daily.  Patient reports taking once daily    . rOPINIRole (REQUIP) 0.25 MG tablet Take 1 tablet (0.25 mg total) by mouth at bedtime. Increase weekly as needed for restless legs 90 tablet 0  . rosuvastatin (CRESTOR) 20 MG tablet TAKE ONE TABLET BY MOUTH ONCE DAILY 90 tablet 1   Facility-Administered Medications Prior to Visit  Medication Dose Route Frequency Provider Last Rate Last Dose  . albuterol (PROVENTIL) (5 MG/ML) 0.5% nebulizer solution 2.5 mg  2.5 mg Nebulization Once [name], MD        Review of Systems;  Patient denies headache, fevers, malaise, unintentional weight loss, skin rash, eye pain, sinus congestion and sinus pain, sore throat, dysphagia,  hemoptysis , cough, dyspnea, wheezing, chest pain, palpitations, orthopnea, edema, abdominal pain, nausea, melena, diarrhea, constipation,  flank pain, dysuria, hematuria, urinary  Frequency, nocturia, numbness, tingling, seizures,  Focal weakness, Loss of consciousness,  Tremor, insomnia, depression, anxiety, and suicidal ideation.      Objective:  BP 122/64 (BP Location: Left Arm, Patient Position: Sitting, Cuff Size: Normal)   Pulse 67   Temp 98.2 F (36.8 C)   Wt 185 lb 12.8 oz (84.3 kg)   SpO2 98%   BMI 29.10 kg/m   BP Readings from Last 3 Encounters:  10/27/17 122/64  10/14/17 (!) 116/58  09/25/17 (!) 130/58    Wt Readings from Last 3 Encounters:  10/27/17 185 lb 12.8 oz (84.3 kg)  10/14/17 187 lb 9.6 oz (85.1 kg)  09/25/17 186 lb (84.4 kg)    General appearance: alert, cooperative and appears stated age Back: symmetric, no curvature. ROM normal. No CVA tenderness. Lungs: clear to auscultation bilaterally Heart: regular rate and rhythm, S1, S2 normal, no murmur, click, rub or gallop Abdomen: soft, non-tender; bowel sounds normal; no masses,  no organomegaly Pulses: 2+ and symmetric Skin: Skin color, texture, turgor normal. No rashes or lesions Lymph nodes: Cervical, supraclavicular, and axillary nodes normal. Neuro: CNs 2-12 intact. DTRs 2+/4 in biceps, brachioradialis, patellars and achilles. Muscle strength 5/5 in upper and lower exremities. Fine resting tremor bilaterally both hands cerebellar function normal. Romberg negative.  No pronator drift.   Gait normal.   Lab Results  Component Value Date   HGBA1C 7.7 (H) 09/25/2017   HGBA1C 7.5 (H) 06/08/2017   HGBA1C 7.5 (H) 10/08/2016    Lab Results  Component Value Date   CREATININE 1.56 (H) 09/25/2017   CREATININE 1.48 (H) 06/08/2017   CREATININE 1.35 (H) 06/04/2017    Lab Results  Component Value Date   WBC 6.9 09/25/2017   HGB 11.2 (L) 09/25/2017   HCT 33.6 (L) 09/25/2017   PLT 290.0 09/25/2017   GLUCOSE 126 (H) 09/25/2017   CHOL 124 11/15/2016   TRIG 175 (H) 11/15/2016   HDL 29 (L) 11/15/2016   LDLDIRECT 102.0 03/14/2016   LDLCALC 60  11/15/2016   ALT 13 09/25/2017   AST 19 09/25/2017   NA 139 09/25/2017   K 3.6 09/25/2017   CL 106 09/25/2017   CREATININE 1.56 (H) 09/25/2017   BUN 22 09/25/2017   CO2 28 09/25/2017   TSH 0.953 11/15/2016   INR 1.2 (H) 02/02/2015   HGBA1C 7.7 (H) 09/25/2017   MICROALBUR 28.9 (H) 09/26/2015    Mm Digital Screening Bilateral  Result Date: 09/07/2017 CLINICAL DATA:  Screening. EXAM: DIGITAL SCREENING BILATERAL MAMMOGRAM WITH CAD COMPARISON:  Previous exam(s). ACR Breast Density Category b: There are scattered areas of fibroglandular density. FINDINGS: There are no findings suspicious for malignancy. Images  were processed with CAD. IMPRESSION: No mammographic evidence of malignancy. A result letter of this screening mammogram will be mailed directly to the patient. RECOMMENDATION: Screening mammogram in one year. (Code:SM-B-01Y) BI-RADS CATEGORY  1: Negative. Electronically Signed   By: Fidela Salisbury M.D.   On: 09/07/2017 13:01    Assessment & Plan:   Problem List Items Addressed This Visit    Low back pain with radiation    Her pain complaints are not suggestive of sciatica, more concerning for arthritis and her current lumbar spine films were negative for fracture and showed  mild anterolisthesis of L4 .  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  Will treat with tylenol, refer for PT>  If no improvement in 4-6 weeks will repeat MRI        Other Visit Diagnoses    Back pain with radiculopathy    -  Primary   Relevant Orders   DG Lumbar Spine Complete (Completed)   Ambulatory referral to Physical Therapy      I am having Casandra L. Dever maintain her budesonide-formoterol, acetaminophen, docusate sodium, fluticasone, nitroGLYCERIN, BRILINTA, B-D UF III MINI PEN NEEDLES, feeding supplement (GLUCERNA SHAKE), LYRICA, furosemide, rosuvastatin, losartan, ALPRAZolam,  aspirin, metoprolol succinate, omeprazole, Insulin Lispro Prot & Lispro, pantoprazole, isosorbide mononitrate, glucose blood, and rOPINIRole.  No orders of the defined types were placed in this encounter.   There are no discontinued medications.  Follow-up: No follow-ups on file.   [name], MD

## 2017-10-28 NOTE — Assessment & Plan Note (Signed)
Her pain complaints are not suggestive of sciatica, more concerning for arthritis and her current lumbar spine films were negative for fracture and showed  mild anterolisthesis of L4 .  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  Will treat with tylenol, refer for PT>  If no improvement in 4-6 weeks will repeat MRI

## 2017-10-29 ENCOUNTER — Ambulatory Visit: Payer: Medicare Other | Admitting: *Deleted

## 2017-10-29 ENCOUNTER — Other Ambulatory Visit: Payer: Self-pay | Admitting: *Deleted

## 2017-10-29 NOTE — Patient Outreach (Signed)
Pamela Collier) Care Management   10/29/2017  Pamela Collier 03-Feb-1941 [id number]  Pamela Collier is an 77 y.o. female  Subjective:  Patient discussed problems with pain in her legs especially at night.Patient reports she has an appointment for a bone scan on next week.  Patient reports her blood sugars having been doing well,and her weights are staying in about the same range.   Patient discussed she had to reschedule her appointment with neurologist due to husband thinking she would need s $200 copayment and next available appointment is not until next month and she is unsure what date.   Patient states that she has not gotten loose hand rail in home repaired yet, reports she has been receiving calls from the church and they are still trying to find someone to repair.   Objective:  BP 118/60 (BP Location: Left Arm, Patient Position: Sitting, Cuff Size: Large)   Pulse 60   Resp 18   Wt 180 lb (81.6 kg)   SpO2 98%   BMI 28.19 kg/m  Review of Systems  Constitutional: Negative.   HENT: Negative.   Eyes: Negative.   Respiratory: Negative.   Cardiovascular: Negative.   Gastrointestinal: Negative.   Genitourinary: Negative.   Musculoskeletal:       Lower leg discomfort at night   Skin: Negative.   Neurological: Negative.   Endo/Heme/Allergies: Negative.   Psychiatric/Behavioral: Negative.     Physical Exam  Constitutional: She is oriented to person, place, and time. She appears well-developed and well-nourished.  Cardiovascular: Normal rate and normal heart sounds.  Respiratory: Effort normal and breath sounds normal.  GI: Soft. Bowel sounds are normal.  Musculoskeletal: Normal range of motion.  Neurological: She is alert and oriented to person, place, and time.  Skin: Skin is warm and dry.       Encounter Medications:   Outpatient Encounter Medications as of 10/29/2017  Medication Sig Note  . acetaminophen (TYLENOL) 500 MG tablet Take 1,000 mg by  mouth every 6 (six) hours as needed for mild pain or headache.    . ALPRAZolam (XANAX) 0.25 MG tablet TAKE 1 TABLET BY MOUTH ONCE DAILY 09/23/2017: Taking as needed  . aspirin 81 MG chewable tablet Chew 81 mg by mouth daily.    . B-D UF III MINI PEN NEEDLES 31G X 5 MM MISC USE THREE TIMES A DAY   . BRILINTA 90 MG TABS tablet Take 1 tablet (90 mg total) by mouth 2 (two) times daily.   . budesonide-formoterol (SYMBICORT) 160-4.5 MCG/ACT inhaler Inhale 2 puffs into the lungs 2 (two) times daily.   [name] docusate sodium (COLACE) 100 MG capsule Take 100 mg by mouth daily as needed for mild constipation. Reported on 11/09/2015   . feeding supplement, GLUCERNA SHAKE, (GLUCERNA SHAKE) LIQD Take 237 mLs by mouth 2 (two) times daily between meals.   . fluticasone (FLONASE) 50 MCG/ACT nasal spray Place 2 sprays into both nostrils daily as needed for rhinitis.   . furosemide (LASIX) 20 MG tablet Take 1 tablet (20 mg total) by mouth every other day.   [name] glucose blood (ACCU-CHEK AVIVA PLUS) test strip USE TO CHECK GLUCOSE THREE TIMES DAILY   . Insulin Lispro Prot & Lispro (HUMALOG MIX 75/25 KWIKPEN) (75-25) 100 UNIT/ML Kwikpen Inject 45 units in the morning and 40 units in the evening (Patient taking differently: Inject 40 units with breakfast and 40 units in with supper)   . isosorbide mononitrate (IMDUR) 30 MG 24 hr tablet TAKE  1 TABLET DAILY   . losartan (COZAAR) 100 MG tablet TAKE 1 TABLET DAILY   . LYRICA 100 MG capsule TAKE 1 CAPSULE BY MOUTH THREE TIMES DAILY (Patient taking differently: Take 1 capsule 2 times daily) 08/25/2017: Sometimes takes BID to save money  . metoprolol succinate (TOPROL-XL) 25 MG 24 hr tablet Take 1 tablet (25 mg total) by mouth daily.   . nitroGLYCERIN (NITROLINGUAL) 0.4 MG/SPRAY spray Place 1 spray under the tongue every 5 (five) minutes x 3 doses as needed for chest pain.   [name] omeprazole (PRILOSEC) 40 MG capsule Take 1 capsule (40 mg total) by mouth daily.   . pantoprazole (PROTONIX) 40  MG tablet Take 40 mg by mouth daily. Patient reports taking once daily   . rOPINIRole (REQUIP) 0.25 MG tablet Take 1 tablet (0.25 mg total) by mouth at bedtime. Increase weekly as needed for restless legs   . rosuvastatin (CRESTOR) 20 MG tablet TAKE ONE TABLET BY MOUTH ONCE DAILY    Facility-Administered Encounter Medications as of 10/29/2017  Medication  . albuterol (PROVENTIL) (5 MG/ML) 0.5% nebulizer solution 2.5 mg    Functional Status:   In your present state of health, do you have any difficulty performing the following activities: 08/24/2017 06/04/2017  Hearing? N N  Vision? N N  Difficulty concentrating or making decisions? Y N  Walking or climbing stairs? Y Y  Dressing or bathing? N N  Doing errands, shopping? Y N  Comment husband helps -  Conservation officer, nature and eating ? Y -  Comment husband helps -  Using the Toilet? N -  In the past six months, have you accidently leaked urine? Y -  Comment wears pad -  Do you have problems with loss of bowel control? N -  Managing your Medications? N -  Managing your Finances? N -  Housekeeping or managing your Housekeeping? Y -  Comment husbands helps -  Some recent data might be hidden    Fall/Depression Screening:    Fall Risk  08/24/2017 11/10/2016 07/01/2016  Falls in the past year? Yes No Yes  Comment - - -  Number falls in past yr: 1 - 1  Injury with Fall? - - No  Risk Factor Category  - - -  Risk for fall due to : History of fall(s) - Impaired balance/gait  Risk for fall due to: Comment - - -  Follow up Falls prevention discussed - Falls prevention discussed;Education provided   [facility] 2/9 Scores 08/24/2017 11/10/2016 07/01/2016 02/12/2016 12/03/2015 10/10/2015 10/30/2014  PHQ - 2 Score 3 0 0 3 0 1 0  PHQ- 9 Score 6 - - 7 - - -  Exception Documentation - - - - - - -    Assessment: Routine home visit   Leg pain Taking tylenol as prescribed with relief during the day still discomfort at night, has Dexa scan scheduled in next week., no  falls, anticipating referral call for outpatient PT.  Diabetes- consistent with monitoring, lowest reading 77, and highest 181, no hypoglycemia episodes. Noted dark area on left 3rd toe with dark area patient reports she wore shoes to tight, reports planning visit to podiatrist to address callus on bottom of foot also.  Heart Failure - consistent with weights,no sudden weight increases or increases in shortness of breath. Taking medications as prescribed   Plan:  Placed call to neurology office to concern patient initial appointment 6/16 at 1115 with Dr.Potter, representative states patient will not have copayment Will plan follow up  call in the next month.  Will update LCSW, provided patient with LCSW contact number.  Will send PCP home visit note.   THN CM Care Plan Problem One     Most Recent Value  Care Plan Problem One  High fall risk related to recent fall   Role Documenting the Problem One  Care Management [place] for Problem One  Active  THN Long Term Goal   Patient will report no falls in the next 90 days   THN Long Term Goal Start Date  08/25/17 Barrie Folk updated ]  Interventions for Problem One Long Term Goal  Reviewed fall precautions, reinforced using cane at all times, discussed after rising to stand  a few seconds prior to walking .   THN CM Short Term Goal #1   Pateint will report doing chair exercise at least 3 times a day in the next 30 days   THN CM Short Term Goal #1 Start Date  08/25/17  [facility] CM Short Term Goal #1 Met Date  09/23/17  THN CM Short Term Goal #2   Patient will report improved home safety environment in the next 30 days   THN CM Short Term Goal #2 Start Date  08/25/17    [facility] [place] CM Care Plan Problem Two     Most Recent Value  Care Plan Problem Two  Diabetes self care management   Role Documenting the Problem Two  Care Management Coordinator  Care Plan for Problem Two  Active  THN CM Short Term Goal #1   Patient will be able to report 3 symptoms of  hypoglycemia and how to treat in the next 30 days    THN CM Short Term Goal #1 Start Date  08/25/17  [facility] CM Short Term Goal #1 Met Date   09/23/17  [facility] CM Short Term Goal #2   Patient will be able to report increased knowledge of foot care in the next 30 days   THN CM Short Term Goal #2 Start Date  10/29/17  Interventions for Short Term Goal #2  Discussed with patient, daily inspection of feet, notifying MD/Podiatrist of new concerns, of swelling , redness or open areas, avoid wearing shoes that are too tight, reviewed how diabetes can damage nerves making it less likely to feel pain, heat cold. ,       [name], RN, [place] Management Coordinator  [phone]- Mobile [phone]- [place]

## 2017-11-02 ENCOUNTER — Telehealth: Payer: Self-pay | Admitting: *Deleted

## 2017-11-02 ENCOUNTER — Ambulatory Visit
Admission: RE | Admit: 2017-11-02 | Discharge: 2017-11-02 | Disposition: A | Discharge status: Home / Self Care | Payer: Medicare Other | Source: Ambulatory Visit | Attending: Internal Medicine | Admitting: Internal Medicine

## 2017-11-02 DIAGNOSIS — M85851 Other specified disorders of bone density and structure, right thigh: Secondary | ICD-10-CM | POA: Diagnosis not present

## 2017-11-02 DIAGNOSIS — Z780 Asymptomatic menopausal state: Secondary | ICD-10-CM | POA: Diagnosis not present

## 2017-11-02 NOTE — Telephone Encounter (Signed)
Copied from [place] [phone]. Topic: Referral - Request >> Nov 02, 2017 11:30 AM Scherrie Gerlach wrote: Reason for CRM:  pt following up on referral for sports rehab.  Pt states she would prefer to go to rehab where she went before, in the basement at the hospital. Pt does not know where this rehab place is. Even if she has to wait.

## 2017-11-09 ENCOUNTER — Other Ambulatory Visit: Payer: Self-pay | Admitting: *Deleted

## 2017-11-09 NOTE — Patient Outreach (Signed)
[facility]) Care Management  11/09/2017  LEANN MAYWEATHER 05-Dec-1940 [id number]   Phone call to patient to follow up on referral to the [place] to repair the railing in her home to avoid falls. Patient confirms that a couple did come out today to assess the work that needed to be done and will return tomorrow to repair the railing,    This Education officer, museum also explored alternatives to increase her socialization. Per patient, she is familiar with the [facility] in her area but is looking forward to referral to physical therapy. Per patient, she will resume activities at the [facility] following her PT.    This Education officer, museum will close patient to social work at this time. This Education officer, museum provided patient with this social worker's contact information if there are any needs in the future.,   [name] [facility] Care Management [phone]

## 2017-11-10 ENCOUNTER — Telehealth: Payer: Self-pay

## 2017-11-10 NOTE — Telephone Encounter (Signed)
Copied from [place] [phone]. Topic: Quick Communication - Lab Results >> Nov 10, 2017  2:36 PM Hewitt Shorts wrote: Pt is looking for lab results from 10/14/17  best number to call is [phone]

## 2017-11-11 ENCOUNTER — Ambulatory Visit: Payer: Self-pay | Admitting: *Deleted

## 2017-11-11 NOTE — Telephone Encounter (Signed)
See result note message 

## 2017-11-16 ENCOUNTER — Other Ambulatory Visit: Payer: Self-pay | Admitting: *Deleted

## 2017-11-16 NOTE — Patient Outreach (Signed)
[facility]) Care Management  11/16/2017  Pamela Collier August 27, 1940 [id number]  Unsuccessful call attempt to patient , able to leave a HIPAA compliant message for return call   1348  Return call from patient , reports that she has been feeling pretty good Patient discussed  day bus trip that she is planning in the next few weeks to able to get out of the house some.   Diabetes .  Patient discussed her blood sugar this morning was 137, no low blood sugar episodes. Still checking blood sugars twice daily . She discussed eating a little more during the holiday but she is back to her usual meals now.  Falls  No recent falls, still using her walker and able to complete usual activities in home. She discussed agency came to repair rail that was shaky in her home but he was unable to repair.  Heart failure  Patient continues to weigh daily and keep a record, she denies any new sudden weight gain, increase shortness of breath issue when symptoms reviewed.  Medications  Patient reports she continues to fill her pill organizer, she denies any difficulty or concerns .    Patient discussed upcoming physical therapy has been set up at a location she is not familiar with , she is requesting therapy to be done a [facility] building that she is familiar with .  Placed call to outpatient therapy discussed patient therapy had been set up to be done [address] location, Jackelyn Poling was able to set up appointment with therapy at [facility] location beginning on 7/22 for evaluation .  Returned call to patient she will be out of town on 7/22, but still want her therapy at [facility] if she has to wait. [facility] outpatient therapy made aware and will have to check on schedule for initial evaluation appointment. Provided my contact number as well as patient . Provide patient contact number to [facility] outpatient therapy department .  Discussed with patient importance of beginning therapy , to help with back discomfort  that she reports still bothers her at time.   Patient discussed her upcoming visit to neurology office for initial visit.    Plan  Will plan next follow up visit in the next 2 weeks, will assess progress with care goals and further community care coordination needs .  Follow call this week regarding outpatient PT coordination of visit.    [name], RN, [place] Management Coordinator  [phone]- Mobile [phone]- Toll Free Main Office

## 2017-11-19 ENCOUNTER — Other Ambulatory Visit: Payer: Self-pay | Admitting: *Deleted

## 2017-11-19 NOTE — Patient Outreach (Signed)
[facility]) Care Management  11/19/2017  SOLEIL MAS 07-31-1940 [id number]   Care Coordination call  Successful outreach call to patient , to follow up on her outpatient physical therapy appointment that she requested change in date of service.  Follow up call to [facility] physical therapy department, spoke with Jackelyn Poling to discuss patient appointment for PT therapy per MD orders ,  verified with patient that she  is agreeable with initial evaluation  on 7/30 at 0900 and she will be reminded by telephone on the day before visit .    Plan  Will plan routine home visit with patient in the next 3 weeks.    [name], RN, [place] Management Coordinator  [phone]- Mobile [phone]- Toll Free Main Office

## 2017-11-23 ENCOUNTER — Other Ambulatory Visit: Payer: Self-pay | Admitting: Internal Medicine

## 2017-11-23 ENCOUNTER — Ambulatory Visit: Payer: Medicare Other

## 2017-11-24 DIAGNOSIS — R251 Tremor, unspecified: Secondary | ICD-10-CM | POA: Diagnosis not present

## 2017-11-24 DIAGNOSIS — R51 Headache: Secondary | ICD-10-CM | POA: Diagnosis not present

## 2017-11-24 DIAGNOSIS — R413 Other amnesia: Secondary | ICD-10-CM | POA: Diagnosis not present

## 2017-11-30 ENCOUNTER — Ambulatory Visit: Payer: Medicare Other | Admitting: Physical Therapy

## 2017-12-03 ENCOUNTER — Encounter: Payer: Medicare Other | Admitting: Physical Therapy

## 2017-12-07 ENCOUNTER — Encounter: Payer: Medicare Other | Admitting: Physical Therapy

## 2017-12-08 ENCOUNTER — Encounter: Payer: Self-pay | Admitting: Physical Therapy

## 2017-12-08 ENCOUNTER — Ambulatory Visit: Payer: Medicare Other | Attending: Internal Medicine | Admitting: Physical Therapy

## 2017-12-08 ENCOUNTER — Other Ambulatory Visit: Payer: Self-pay

## 2017-12-08 DIAGNOSIS — R2689 Other abnormalities of gait and mobility: Secondary | ICD-10-CM | POA: Insufficient documentation

## 2017-12-08 DIAGNOSIS — M5440 Lumbago with sciatica, unspecified side: Secondary | ICD-10-CM | POA: Diagnosis not present

## 2017-12-08 DIAGNOSIS — M6281 Muscle weakness (generalized): Secondary | ICD-10-CM | POA: Diagnosis not present

## 2017-12-08 NOTE — Therapy (Signed)
[place] MAIN [facility] SERVICES [address] [place], [place], [address] Phone: [phone]   Fax:  [phone]  Physical Therapy Evaluation  Patient Details  [name] MRN: [id number] Date of Birth: April 22, 1941 Referring Provider: [name]    Encounter Date: 12/08/2017  PT End of Session - 12/08/17 0914    Visit Number  1    Number of Visits  17    Date for PT Re-Evaluation  02/02/18    PT Start Time  0900    PT Stop Time  1000    PT Time Calculation (min)  60 min    Equipment Utilized During Treatment  Gait belt    Activity Tolerance  Patient tolerated treatment well;Patient limited by pain    Behavior During Therapy  [facility] for tasks assessed/performed       Past Medical History:  Diagnosis Date  . Acute respiratory failure ([place]) 11/21/2014  . Anxiety   . Arthritis 08/05/2013  . Atherosclerotic peripheral vascular disease with gangrene [facility]) august 2012  . Cardiomyopathy, ischemic    a. EF 35 to 40% by echo in 2013 b. EF improved to 50-55% by echo in 04/2015; 11/2016 Echo: EF 55-60%, Gr1 DD, mildly dil LA, nl RV fxn.  . Cerebral infarct ([place]) 08/17/2013  . Chronic diastolic CHF (congestive heart failure) (HCC)    a. EF 50-55% by echo in 04/2015; b. 11/2016 Echo: EF 55-60%, Gr1 DD.  [name] COPD (chronic obstructive pulmonary disease) ([place])   . Coronary artery disease, occlusive    a. Previous PCI to the LAD, LCx, and RCA in 2010, 2011, 2013, and 2016. All performed in [place]; b. 11/2016 Lexiscan MV: EF 43%, no ischemia->low risk.  . 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 Diabetic diarrhea ([place]) 10/03/2014  . DM type 2, uncontrolled, with renal complications ([place]) 05/18/7937  . Hepatic steatosis    by CT abd pelvis  . History of kidney stones    at a younger age  . Hyperlipidemia LDL goal <100 02/23/2014  . Hypertensive heart disease   . Osteoporosis, post-menopausal   . 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 Pill esophagitis   . Pleural effusion 10/25/2012   10/2012 CT chest >> small to moderate R lung effusion>> chylothorax, 100% lymphs 10/2013 thoracentesis> cytology negative, WBC 1471, > 90% "small lymphs" 01/2014 CT chest> near complete resolution of pleural effusion, stable lymphadenopathy 08/2014 CT chest [place] ([facility]): small right sided effusion decreased in size, stable mediastinal lymphadenopathy 1.0cm largest, 71m  . 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 Sleep apnea   . Stage III chronic kidney disease [facility])     Past Surgical History:  Procedure Laterality Date  . ABDOMINAL HYSTERECTOMY     at ge 39. secondary to bleeding/partial  . ANTERIOR CERVICAL DECOMP/DISCECTOMY FUSION N/A 10/10/2016   Procedure: Anterior Cervical Discectomy Fusion - Cervical four4- five - Cervical five-Cervical six - Cervical six-Cervical seven;  Surgeon: Earnie Larsson, MD;  Location: [place];  Service: Neurosurgery;  Laterality: N/A;  . BREAST CYST ASPIRATION Right   . CARPAL TUNNEL RELEASE Bilateral   .  CHOLECYSTECTOMY     in [place]   . COLONOSCOPY WITH PROPOFOL N/A 01/09/2015   Procedure: COLONOSCOPY WITH PROPOFOL;  Surgeon: Lucilla Lame, MD;  Location: [facility] ENDOSCOPY;  Service: Endoscopy;  Laterality: N/A;  . CORONARY ANGIOPLASTY WITH STENT PLACEMENT     [place]; [facility]  . EYE SURGERY     bil cataracts  . HERNIA REPAIR     umbilical/ Dr Pat Patrick  . OOPHORECTOMY    . PTCA  August 2012   Right Posterior tibial artery , [place]  . TOE AMPUTATION  Sept 2012   Right 2nd toe, [place]    There were no vitals filed for this visit.   Subjective Assessment - 12/08/17 0912     Subjective  Patient has back pain that is intermittent and BLE pain down to her ankles intermittent.    Pertinent History  Pamela Collier presents for evaluation of  intermittent  left sided leg pain that began without fall or injury several weeks ago.  She state that the pain started  In her left foot,  In the  Toes, and  has ascended up the lateral side of her left leg and is accompanied by stiffness of both legs.  She also describes back pain radiates into left hip and left leg.  No recent fall.            [facility] PT Assessment - 12/08/17 0921      Assessment   Medical Diagnosis  Back pain with radiculopathy    Referring Provider  Deborra Medina L     Onset Date/Surgical Date  10/27/17    Hand Dominance  Right      Precautions   Precautions  None      Restrictions   Weight Bearing Restrictions  No      Balance Screen   Has the patient fallen in the past 6 months  No    Has the patient had a decrease in activity level because of a fear of falling?   Yes    Is the patient reluctant to leave their home because of a fear of falling?   No      Home Environment   Living Environment  Private residence    Living Arrangements  Spouse/significant other    Available Help at Discharge  Family    Type of [place] to enter    Entrance Stairs-Number of Steps  -- 1    Entrance Stairs-Rails  None    Home Layout  Two level    Alternate Level Stairs-Number of Steps  -- 3 steps    Alternate Level Stairs-Rails  Right    [place] - 2 wheels;Cane - single point;Tub bench;Grab bars - toilet;Grab bars - tub/shower      Prior Function   Level of Independence  Independent with household mobility with device    Vocation  On disability    Leisure  -- walk        POSTURE: WFL    PROM/AROM:WNL BUE and BLE  STRENGTH:  Graded on a 0-5 scale Muscle Group Left Right                          Hip Flex 3/5 3/5  Hip Abd 3/5 3/5  Hip Add 2/5 2/5           Knee Flex 4/5 4/5  Knee Ext 4/5 4/5  Ankle DF 4/5 4/5  Ankle PF 4/5 4/5   SENSATION: WNL   SPECIAL TESTS:+ FABER BLE   FUNCTIONAL MOBILITY: Transfers with definite use of UE for support and several attempts to   BALANCE: Standing Dynamic Balance  Normal Stand independently unsupported, able to weight shift and cross midline maximally   Good Stand independently unsupported, able to weight shift and cross midline moderately   Good-/Fair+ Stand independently unsupported, able to weight shift across midline minimally   Fair Stand independently unsupported, weight shift, and reach ipsilaterally, loss of balance when crossing midline x  Poor+ Able to stand with Min A and reach ipsilaterally, unable to weight shift   Poor Able to stand with Mod A and minimally reach ipsilaterally, unable to cross midline.    Static Standing Balance  Normal Able to maintain standing balance against maximal resistance   Good Able to maintain standing balance against moderate resistance   Good-/Fair+ Able to maintain standing balance against minimal resistance   Fair Able to stand unsupported without UE support and without LOB for 1-2 min x  Fair- Requires Min A and UE support to maintain standing without loss of balance   Poor+ Requires mod A and UE support to maintain standing without loss of balance   Poor Requires max A and UE support to maintain standing balance without loss       GAIT: Patient ambulates with spc with decreased gait speed and deviation in path with increased speed  OUTCOME MEASURES: TEST Outcome Interpretation  5 times sit<>stand 17.5.22sec >48 yo, >15 sec indicates increased risk for falls  10 meter walk test      .85           m/s <1.0 m/s indicates increased risk for falls; limited community ambulator  Timed up and Go   17.68              sec <14 sec indicates increased risk for falls  6 minute walk test    850            Feet 1000 feet is community ambulator             Treatment: Leg press 90 lbs x 20 x 3 sets HEP instruction including : Sit to stand x 10  Standing hip abd x 10 BLE Standing hip ext x 10 BLE          Objective measurements completed on examination: See above findings.              PT Education - 12/08/17 0914    Education Details  plan of care    Person(s) Educated  Patient    Methods  Explanation    Comprehension  Verbalized understanding       PT Short Term Goals - 12/08/17 0946      PT SHORT TERM GOAL #1   Title  Patient will be independent in home exercise program to improve strength/mobility for better functional independence with ADLs.    Time  4    Period  Weeks    Status  New    Target Date  01/05/18      PT SHORT TERM GOAL #2   Title  Patient will complete five times sit to stand test in < 15 seconds indicating an increased LE strength and improved balance.    Time  4    Period  Weeks    Status  New    Target  Date  01/05/18        PT Long Term Goals - 12/08/17 0947      PT LONG TERM GOAL #1   Title  Patient will increase six minute walk test distance to >1000 for progression to community ambulator and improve gait ability    Time  8    Period  Weeks    Status  New    Target Date  02/02/18      PT LONG TERM GOAL #2   Title  Patient will increase 10 meter walk test to >1.36m/s as to improve gait speed for better community ambulation and to reduce fall risk.    Time  8    Period  Weeks    Status  New    Target Date  02/02/18      PT LONG TERM GOAL #3   Title  Patient will reduce timed up and go to <11 seconds to reduce fall risk and demonstrate improved transfer/gait ability.    Time  8    Period  Weeks    Status  New    Target Date  02/02/18      PT LONG TERM GOAL #4   Title  Patient will be require no assist with ascend/descend 3 steps using Least restrictive assistive device.    Time  8    Period  Weeks    Status  New    Target Date  02/02/18             Plan -  12/08/17 0915    Clinical Impression Statement  Patient presents to PT evaluation with dx of low back pain and BLE pain. She currently does not have any pain today in her back or her legs. She has had 2 falls a year ago and no recent falls. She has decreased BLE strength and decreased gait speed with a SPC. She has decreased mobiity and uses a spc or RW . She will benefit from skilled PT to improve her mobility, strength and quality of life.     Clinical Presentation  Stable    Clinical Decision Making  Low    Rehab Potential  Good    PT Frequency  2x / week    PT Duration  8 weeks    PT Treatment/Interventions  Aquatic Therapy;Therapeutic activities;Therapeutic exercise;Balance training;Neuromuscular re-education;Patient/family education;Manual techniques;Dry needling    PT Next Visit Plan  BLE strengthening, balance training    Consulted and Agree with Plan of Care  Patient       Patient will benefit from skilled therapeutic intervention in order to improve the following deficits and impairments:  Abnormal gait, Decreased balance, Decreased mobility, Difficulty walking, Dizziness, Pain, Impaired flexibility, Decreased activity tolerance  Visit Diagnosis: Acute right-sided low back pain with sciatica, sciatica laterality unspecified  Muscle weakness (generalized)  Other abnormalities of gait and mobility     Problem List Patient Active Problem List   Diagnosis Date Noted  . Restless legs 10/17/2017  . Leg pain 10/15/2017  . Normocytic anemia, not due to blood loss 09/27/2017  . Melena 09/25/2017  . Cognitive decline 08/16/2017  . Polypharmacy 07/28/2017  . Pneumonia 05/26/2017  . Pill esophagitis 05/23/2017  . Chronic kidney disease (CKD), stage I 11/15/2016  . History of fall 11/12/2016  . Osteoarthritis 11/10/2016  . Right arm pain 10/15/2016  . Cervical stenosis of spinal canal 10/10/2016  . Cervical radiculopathy at C7 07/02/2016  . Acute midline thoracic back pain  07/02/2016  .  Epigastric hernia 04/01/2016  . Umbilical pain 09/81/1914  . CAD in native artery   . Type 2 DM with CKD stage 3 and hypertension ([place])   . Chronic renal insufficiency   . Hypertensive heart disease   . OSA on CPAP 07/07/2015  . Hospital discharge follow-up 07/07/2015  . Chest pain 06/29/2015  . Pleural effusion, right   . Chronic diastolic CHF (congestive heart failure) ([place]) 04/18/2015  . Diabetic neuropathy ([place]) 02/21/2015  . Benign neoplasm of sigmoid colon   . Low back pain with radiation 04/04/2014  . Essential hypertension 04/04/2014  . Hyperlipidemia 02/23/2014  . Long-term use of high-risk medication 02/23/2014  . Other long term (current) drug therapy 02/23/2014  . Dermatophytic onychia 08/17/2013  . Stable angina ([place]) 08/17/2013  . Cerebral infarct ([place]) 08/17/2013  . Bony exostosis 08/17/2013  . Obesity, unspecified 08/17/2013  . Arthritis 08/05/2013  . Unsteadiness on feet 03/24/2013  . History of colonic polyps 02/20/2013  . Sarcoidosis ([place]) 12/07/2012  . Dysphagia 12/07/2012  . Pulmonary nodule seen on imaging study 05/20/2012  . 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 Diabetic nephropathy associated with type 2 diabetes mellitus ([place]) 02/29/2012  . Cardiomyopathy, ischemic   . Hepatic steatosis   . Osteoporosis, post-menopausal   . Peripheral vascular disease due to secondary diabetes mellitus ([place])   . Double vessel coronary artery disease 12/14/2011    Alanson Puls, PT DPT 12/08/2017, 9:56 AM  [place] MAIN [facility] SERVICES [address] [place], [place], [address] Phone: [phone]   Fax:  [phone]  [name] MRN: [id number] Date of Birth: [birth year]

## 2017-12-08 NOTE — Patient Instructions (Signed)
SIT TO STAND: Feet in Modified Tandem    Place one foot in front of the other. Lean chest forward. Raise hips and straighten knees to stand. __15_ reps per set, _2__ sets per day, __7_ days per week Hold onto a support. Repeat with other leg.  Copyright  VHI. All rights reserved.  Hip Extension: Standing (Single Leg)    In shoulder width stance, anchor tubing under one foot. Twist and put around other ankle. Pull same leg back, keeping knee nearly straight. Repeat 15__ times per set. Repeat with other leg. Do __2 sets per session. Do _7_ sessions per week.  http://tub.exer.us/194   Copyright  VHI. All rights reserved.  HIP: Abduction - Standing (Band)    Place band around legs. Squeeze glutes. Raise leg out and slightly back.  Use band. _15__ reps per set, __2_ sets per day, 7___ days per week Hold onto a support.  Copyright  VHI. All rights reserved.

## 2017-12-09 ENCOUNTER — Other Ambulatory Visit: Payer: Self-pay | Admitting: *Deleted

## 2017-12-09 ENCOUNTER — Encounter: Payer: Medicare Other | Admitting: Physical Therapy

## 2017-12-09 NOTE — Patient Outreach (Addendum)
[facility]) Care Management   12/09/2017  Pamela Collier Mar 14, 1941 [id number]  Pamela Collier is an 77 y.o. female  Subjective:  Patient reports attending physical therapy appointment on this week and looking forward to continuing to help with back discomfort and being with others.   Patient discussed her recent day trip and how much she enjoyed it and plans to do more of that. Patient also looking forward to upcoming social events she will be attending and her family coming into town.   Patient reports that she has attended appointment at neurology regarding her memory and tremors and has been started on a new medication   Objective:  BP 120/60 (BP Location: Left Arm, Patient Position: Sitting, Cuff Size: Normal)   Pulse 70   Resp 18   Ht 1.702 m (5' 7" )   Wt 182 lb (82.6 kg)   SpO2 97%   BMI 28.51 kg/m  Review of Systems  Constitutional: Negative.   HENT: Negative.   Eyes: Negative.   Respiratory: Negative.   Cardiovascular: Negative.   Gastrointestinal: Negative.   Genitourinary: Negative.   Musculoskeletal: Positive for back pain.  Skin: Negative.   Neurological: Positive for weakness.  Endo/Heme/Allergies: Negative.   Psychiatric/Behavioral: Positive for memory loss.    Physical Exam  Constitutional: She is oriented to person, place, and time. She appears well-developed and well-nourished.  Cardiovascular: Normal rate and normal heart sounds.  Respiratory: Effort normal and breath sounds normal.  GI: Soft.  Neurological: She is alert and oriented to person, place, and time. Right Babinski's sign: reports tremors of hands   Skin: Skin is warm and dry.  Psychiatric: She has a normal mood and affect. Her behavior is normal. Judgment and thought content normal.    Encounter Medications:   Outpatient Encounter Medications as of 12/09/2017  Medication Sig Note  . acetaminophen (TYLENOL) 500 MG tablet Take 1,000 mg by mouth every 6 (six) hours as  needed for mild pain or headache.    . ALPRAZolam (XANAX) 0.25 MG tablet TAKE 1 TABLET BY MOUTH ONCE DAILY 09/23/2017: Taking as needed  . aspirin 81 MG chewable tablet Chew 81 mg by mouth daily.    . B-D UF III MINI PEN NEEDLES 31G X 5 MM MISC USE THREE TIMES A DAY   . BRILINTA 90 MG TABS tablet Take 1 tablet (90 mg total) by mouth 2 (two) times daily.   . budesonide-formoterol (SYMBICORT) 160-4.5 MCG/ACT inhaler Inhale 2 puffs into the lungs 2 (two) times daily.   [name] docusate sodium (COLACE) 100 MG capsule Take 100 mg by mouth daily as needed for mild constipation. Reported on 11/09/2015   . feeding supplement, GLUCERNA SHAKE, (GLUCERNA SHAKE) LIQD Take 237 mLs by mouth 2 (two) times daily between meals.   . fluticasone (FLONASE) 50 MCG/ACT nasal spray Place 2 sprays into both nostrils daily as needed for rhinitis.   . furosemide (LASIX) 20 MG tablet Take 1 tablet (20 mg total) by mouth every other day.   [name] glucose blood (ACCU-CHEK AVIVA PLUS) test strip USE TO CHECK GLUCOSE THREE TIMES DAILY   . Insulin Lispro Prot & Lispro (HUMALOG MIX 75/25 KWIKPEN) (75-25) 100 UNIT/ML Kwikpen Inject 45 units in the morning and 40 units in the evening (Patient taking differently: Inject 40 units with breakfast and 40 units in with supper)   . isosorbide mononitrate (IMDUR) 30 MG 24 hr tablet TAKE 1 TABLET DAILY   . losartan (COZAAR) 100 MG tablet TAKE 1  TABLET DAILY   . LYRICA 100 MG capsule TAKE 1 CAPSULE BY MOUTH THREE TIMES DAILY (Patient taking differently: Take 1 capsule 2 times daily)   . metoprolol succinate (TOPROL-XL) 25 MG 24 hr tablet TAKE 1 TABLET DAILY   . nitroGLYCERIN (NITROLINGUAL) 0.4 MG/SPRAY spray Place 1 spray under the tongue every 5 (five) minutes x 3 doses as needed for chest pain.   [name] omeprazole (PRILOSEC) 40 MG capsule Take 1 capsule (40 mg total) by mouth daily.   . pantoprazole (PROTONIX) 40 MG tablet Take 40 mg by mouth daily. Patient reports taking once daily   . rOPINIRole (REQUIP)  0.25 MG tablet Take 1 tablet (0.25 mg total) by mouth at bedtime. Increase weekly as needed for restless legs   . rosuvastatin (CRESTOR) 20 MG tablet TAKE ONE TABLET BY MOUTH ONCE DAILY    Facility-Administered Encounter Medications as of 12/09/2017  Medication  . albuterol (PROVENTIL) (5 MG/ML) 0.5% nebulizer solution 2.5 mg    Functional Status:   In your present state of health, do you have any difficulty performing the following activities: 08/24/2017 06/04/2017  Hearing? N N  Vision? N N  Difficulty concentrating or making decisions? Y N  Walking or climbing stairs? Y Y  Dressing or bathing? N N  Doing errands, shopping? Y N  Comment husband helps -  Conservation officer, nature and eating ? Y -  Comment husband helps -  Using the Toilet? N -  In the past six months, have you accidently leaked urine? Y -  Comment wears pad -  Do you have problems with loss of bowel control? N -  Managing your Medications? N -  Managing your Finances? N -  Housekeeping or managing your Housekeeping? Y -  Comment husbands helps -  Some recent data might be hidden    Fall/Depression Screening:    Fall Risk  08/24/2017 11/10/2016 07/01/2016  Falls in the past year? Yes No Yes  Comment - - -  Number falls in past yr: 1 - 1  Injury with Fall? - - No  Risk Factor Category  - - -  Risk for fall due to : History of fall(s) - Impaired balance/gait  Risk for fall due to: Comment - - -  Follow up Falls prevention discussed - Falls prevention discussed;Education provided   [facility] 2/9 Scores 08/24/2017 11/10/2016 07/01/2016 02/12/2016 12/03/2015 10/10/2015 10/30/2014  PHQ - 2 Score 3 0 0 3 0 1 0  PHQ- 9 Score 6 - - 7 - - -  Exception Documentation - - - - - - -    Assessment:  Routine home visit  Heart failure- monitoring daily weights, no sudden increase in weights or swelling, patient able identify worsening symptoms to notify MD of . Diabetes- monitoring blood sugars twice daily keeping a record,no low blood sugar  episodes. Patient able to state symptoms of hypoglycemia and how to treat. 30 day average 150, this morning reading, 129, highest noted reading 199.  Patient reports managing her portion sizes pretty good but at times eating a more on the weekend.  Patient has upcoming podiatry appointment . Last A1c 5/17 - 7.7.  Fall Risk  No falls in the last 90 days , uses cane when out of home  or feeling shaky.Patient attending outpatient physical therapy. .  Memory problems/Tremors  - Initial visit with neurology and started on Aricept. She has concern with being able to keep up with all appointments with memory problems reports.  Medications - patient  taking medication daily as prescribed, filling pill organizer placing refill requesting time.   Patient discussed sometimes that is hard for her to keep things together with her memory problems , patient discussed being unsure of her next appointment with PCP. Patient requested continued follow up with home visit until she feels a little comfortable with keeping up with all her visits and making sure she is doing the right thing with managing her diabetes. Discussed telephone disease management program patient states would probably be interested in that at some point states it is best for her to have someone visit .   Plan:  Will plan to continue to follow in complex  community care coordinator program for management of chronic condition of diabetes, and care coordinator of care needs and support.  Care planning on goal setting at visit.  Will plan follow up call in the next 3 weeks and schedule visit. Will send PCP barrier note of involvement for quarterly update .   THN CM Care Plan Problem One     Most Recent Value  Care Plan Problem One  Knowledge related to diabetes self care management.   Role Documenting the Problem One  Care Management Coordinator  Care Plan for Problem One  Active  THN Long Term Goal   Patient will report increased knowledge of  diabetes self care over the next 60 days   THN Long Term Goal Start Date  12/09/17  [facility] Long Term Goal Met Date  12/09/17 [late entry for goal met]  Interventions for Problem One Long Term Goal  Advised regarding how keeping diabetes under control helping with preventing other medical problems and hospitalizations. Reviewed what A1c is and her recent reading .    THN CM Short Term Goal #1   Patient will report continuing to monitor and record blood sugars daily over next 30 days   THN CM Short Term Goal #1 Start Date  12/09/17  [facility] CM Short Term Goal #1 Met Date  09/23/17  Interventions for Short Term Goal #1  Advised regarding continuing to monitor blood sugar reading, and take to PCP visit to help with managing care .   THN CM Short Term Goal #2   Patient will be able to reports attending all medical appointment over the next 30 days   THN CM Short Term Goal #2 Start Date  12/09/17  Interventions for Short Term Goal #2  Discussed importance of keeping all MD appointment. scheduling next PCP visit to follow up on Diabetes, A1c   THN CM Short Term Goal #3  Patient will be able to report increase knowledge of balanced meal planning over the next 30 days   THN CM Short Term Goal #3 Start Date  12/10/17  Interventions for Short Tern Goal #3  Review plate method of meal planning from [facility] diabetes book.     [facility] CM Care Plan Problem Two     Most Recent Value  Care Plan Problem Two  Diabetes self care management   Role Documenting the Problem Two  Care Management Coordinator  Care Plan for Problem Two  Not Active [see new care plan goals ]  THN CM Short Term Goal #1   Patient will be able to report 3 symptoms of hypoglycemia and how to treat in the next 30 days    THN CM Short Term Goal #1 Start Date  08/25/17  [facility] CM Short Term Goal #1 Met Date   09/23/17  [facility] CM Short Term Goal #2  Patient will be able to report increased knowledge of foot care in the next 30 days   THN CM Short Term Goal #2  Start Date  10/29/17  Interventions for Short Term Goal #2  Reviewed her upcoming podiatry visit next week and benefit of foot care.        [name], RN, [place] Management Coordinator  [phone]- Mobile [phone]- Toll Free Main Office

## 2017-12-10 ENCOUNTER — Encounter: Payer: Self-pay | Admitting: Physical Therapy

## 2017-12-10 ENCOUNTER — Ambulatory Visit: Payer: Medicare Other | Attending: Internal Medicine | Admitting: Physical Therapy

## 2017-12-10 DIAGNOSIS — R2689 Other abnormalities of gait and mobility: Secondary | ICD-10-CM

## 2017-12-10 DIAGNOSIS — M6281 Muscle weakness (generalized): Secondary | ICD-10-CM | POA: Diagnosis not present

## 2017-12-10 DIAGNOSIS — M5440 Lumbago with sciatica, unspecified side: Secondary | ICD-10-CM | POA: Diagnosis not present

## 2017-12-10 NOTE — Therapy (Signed)
[place] MAIN [facility] SERVICES [address] [place], [place], [address] Phone: [phone]   Fax:  [phone]  Physical Therapy Treatment  Patient Details  [name] MRN: [id number] Date of Birth: [birth year] Referring Provider: [name]    Encounter Date: 12/10/2017  PT End of Session - 12/10/17 0925    Visit Number  2    Number of Visits  17    Date for PT Re-Evaluation  02/02/18    PT Start Time  0930    PT Stop Time  1015    PT Time Calculation (min)  45 min    Equipment Utilized During Treatment  Gait belt    Activity Tolerance  Patient tolerated treatment well;Patient limited by pain    Behavior During Therapy  [facility] for tasks assessed/performed       Past Medical History:  Diagnosis Date  . Acute respiratory failure ([place]) 11/21/2014  . Anxiety   . Arthritis 08/05/2013  . Atherosclerotic peripheral vascular disease with gangrene [facility]) august 2012  . Cardiomyopathy, ischemic    a. EF 35 to 40% by echo in 2013 b. EF improved to 50-55% by echo in 04/2015; 11/2016 Echo: EF 55-60%, Gr1 DD, mildly dil LA, nl RV fxn.  . Cerebral infarct ([place]) 08/17/2013  . Chronic diastolic CHF (congestive heart failure) (HCC)    a. EF 50-55% by echo in 04/2015; b. 11/2016 Echo: EF 55-60%, Gr1 DD.  [name] COPD (chronic obstructive pulmonary disease) ([place])   . Coronary artery disease, occlusive    a. Previous PCI to the LAD, LCx, and RCA in 2010, 2011, 2013, and 2016. All performed in [place]; b. 11/2016 Lexiscan MV: EF 43%, no ischemia->low risk.  . 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 Diabetic diarrhea ([place]) 10/03/2014  . DM type 2, uncontrolled, with renal complications ([place]) 6/0/1093  . Hepatic steatosis    by CT abd pelvis  . History of kidney stones    at a younger age  . Hyperlipidemia LDL goal <100 02/23/2014  . Hypertensive heart disease   . Osteoporosis, post-menopausal   . Peripheral vascular disease due to secondary diabetes mellitus  [facility]) July 2011   s/p right 2nd toe amputation for gangrene  . Pill esophagitis   . Pleural effusion 10/25/2012   10/2012 CT chest >> small to moderate R lung effusion>> chylothorax, 100% lymphs 10/2013 thoracentesis> cytology negative, WBC 1471, > 90% "small lymphs" 01/2014 CT chest> near complete resolution of pleural effusion, stable lymphadenopathy 08/2014 CT chest [place] ([facility]): small right sided effusion decreased in size, stable mediastinal lymphadenopathy 1.0cm largest, 66m  . 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 Sleep apnea   . Stage III chronic kidney disease [facility])     Past Surgical History:  Procedure Laterality Date  . ABDOMINAL HYSTERECTOMY     at ge 39. secondary to bleeding/partial  . ANTERIOR CERVICAL DECOMP/DISCECTOMY FUSION N/A 10/10/2016   Procedure: Anterior Cervical Discectomy Fusion - Cervical four4- five - Cervical five-Cervical six - Cervical six-Cervical seven;  Surgeon: Earnie Larsson, MD;  Location: [place];  Service: Neurosurgery;  Laterality: N/A;  . BREAST CYST ASPIRATION Right   . CARPAL TUNNEL RELEASE Bilateral   .  CHOLECYSTECTOMY     in [place]   . COLONOSCOPY WITH PROPOFOL N/A 01/09/2015   Procedure: COLONOSCOPY WITH PROPOFOL;  Surgeon: Lucilla Lame, MD;  Location: [facility] ENDOSCOPY;  Service: Endoscopy;  Laterality: N/A;  . CORONARY ANGIOPLASTY WITH STENT PLACEMENT     [place]; [facility]  . EYE SURGERY     bil cataracts  . HERNIA REPAIR     umbilical/ Dr Pat Patrick  . OOPHORECTOMY    . PTCA  August 2012   Right Posterior tibial artery , [place]  . TOE AMPUTATION  Sept 2012   Right 2nd toe, [place]    There were no vitals filed for this visit.  Subjective Assessment - 12/10/17 0943     Subjective  Patient is not having any back pain or LE pain today.     Pertinent History  Pamela Collier presents for evaluation of  intermittent  left sided leg pain that began without fall or injury several weeks ago.  She state that the pain started  In her left foot,  In the  Toes, and  has ascended up the lateral side of her left leg and is accompanied by stiffness of both legs.  She also describes back pain radiates into left hip and left leg.  No recent fall.       Currently in Pain?  No/denies    Pain Score  0-No pain       Therapeutic exercise:  Nu-step x 5 mins  Step ups x 15 , 6 inch stool   Lunges x 15 BLE  Squats x 10 x 2  SLR x15 each LE X 2, 3 lbs  Marching  2x15   SAQ with5# ankle weights with 3 second holds x10 each LE  LAQ 15 x 2 BLE   sidelying hip abd x 15 x 2 BLE  Standing hip abd with RTB x 15   Standing hip flex  with RTB x 15   Leg press 90 lbs x 20 x 2, heel raises 60 lbs x 20 x 2  Patient needs cues for correct technique and form   Min assist and mod verbal cues used throughout with increased in postural sway and LOB most seen with narrow base of support and while on uneven surfaces. Continues to have balance deficits typical with diagnosis. Patient performs intermediate level exercises without pain behaviors and needs verbal cuing for postural alignment and head positioning                      PT Education - 12/10/17 0943    Education Details  HEP    Person(s) Educated  Patient    Methods  Explanation    Comprehension  Verbalized understanding       PT Short Term Goals - 12/08/17 0946      PT SHORT TERM GOAL #1   Title  Patient will be independent in home exercise program to improve strength/mobility for better functional independence with ADLs.    Time  4    Period  Weeks    Status  New    Target Date  01/05/18      PT SHORT TERM GOAL #2   Title  Patient will complete five times sit to stand test in <  15 seconds indicating an increased LE strength and improved balance.    Time  4    Period  Weeks    Status  New  Target Date  01/05/18        PT Long Term Goals - 12/08/17 0947      PT LONG TERM GOAL #1   Title  Patient will increase six minute walk test distance to >1000 for progression to community ambulator and improve gait ability    Time  8    Period  Weeks    Status  New    Target Date  02/02/18      PT LONG TERM GOAL #2   Title  Patient will increase 10 meter walk test to >1.13m/s as to improve gait speed for better community ambulation and to reduce fall risk.    Time  8    Period  Weeks    Status  New    Target Date  02/02/18      PT LONG TERM GOAL #3   Title  Patient will reduce timed up and go to <11 seconds to reduce fall risk and demonstrate improved transfer/gait ability.    Time  8    Period  Weeks    Status  New    Target Date  02/02/18      PT LONG TERM GOAL #4   Title  Patient will be require no assist with ascend/descend 3 steps using Least restrictive assistive device.    Time  8    Period  Weeks    Status  New    Target Date  02/02/18            Plan - 12/10/17 0944    Clinical Impression Statement  Pt requires direction and verbal cues for correct performance of exercises. Patient demonstrates weakness in BLE and performs open and closed chain exercises with minimal reports of pain to left knee. Pt was able to perform all exercises with max assist and VC for technique..   Patient struggles with speed during movement as well as balance with unstable surfaces.  Pt encouraged continuing new supine HEP .Follow-up as scheduled.    Rehab Potential  Good    PT Frequency  2x / week    PT Duration  8 weeks    PT Treatment/Interventions  Aquatic Therapy;Therapeutic activities;Therapeutic exercise;Balance training;Neuromuscular re-education;Patient/family education;Manual techniques;Dry needling    PT Next Visit Plan  BLE strengthening, balance training     Consulted and Agree with Plan of Care  Patient       Patient will benefit from skilled therapeutic intervention in order to improve the following deficits and impairments:  Abnormal gait, Decreased balance, Decreased mobility, Difficulty walking, Dizziness, Pain, Impaired flexibility, Decreased activity tolerance  Visit Diagnosis: Acute right-sided low back pain with sciatica, sciatica laterality unspecified  Muscle weakness (generalized)  Other abnormalities of gait and mobility     Problem List Patient Active Problem List   Diagnosis Date Noted  . Restless legs 10/17/2017  . Leg pain 10/15/2017  . Normocytic anemia, not due to blood loss 09/27/2017  . Melena 09/25/2017  . Cognitive decline 08/16/2017  . Polypharmacy 07/28/2017  . Pneumonia 05/26/2017  . Pill esophagitis 05/23/2017  . Chronic kidney disease (CKD), stage I 11/15/2016  . History of fall 11/12/2016  . Osteoarthritis 11/10/2016  . Right arm pain 10/15/2016  . Cervical stenosis of spinal canal 10/10/2016  . Cervical radiculopathy at C7 07/02/2016  . Acute midline thoracic back pain 07/02/2016  . Epigastric hernia 04/01/2016  . Umbilical pain 38/18/2993  . CAD in native artery   . Type 2 DM with CKD stage 3 and  hypertension ([place])   . Chronic renal insufficiency   . Hypertensive heart disease   . OSA on CPAP 07/07/2015  . Hospital discharge follow-up 07/07/2015  . Chest pain 06/29/2015  . Pleural effusion, right   . Chronic diastolic CHF (congestive heart failure) ([place]) 04/18/2015  . Diabetic neuropathy ([place]) 02/21/2015  . Benign neoplasm of sigmoid colon   . Low back pain with radiation 04/04/2014  . Essential hypertension 04/04/2014  . Hyperlipidemia 02/23/2014  . Long-term use of high-risk medication 02/23/2014  . Other long term (current) drug therapy 02/23/2014  . Dermatophytic onychia 08/17/2013  . Stable angina ([place]) 08/17/2013  . Cerebral infarct ([place]) 08/17/2013  . Bony exostosis  08/17/2013  . Obesity, unspecified 08/17/2013  . Arthritis 08/05/2013  . Unsteadiness on feet 03/24/2013  . History of colonic polyps 02/20/2013  . Sarcoidosis ([place]) 12/07/2012  . Dysphagia 12/07/2012  . Pulmonary nodule seen on imaging study 05/20/2012  . 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 Diabetic nephropathy associated with type 2 diabetes mellitus ([place]) 02/29/2012  . Cardiomyopathy, ischemic   . Hepatic steatosis   . Osteoporosis, post-menopausal   . Peripheral vascular disease due to secondary diabetes mellitus ([place])   . Double vessel coronary artery disease 12/14/2011    Alanson Puls, PT DPT 12/10/2017, 9:47 AM  [place] MAIN [facility] SERVICES [address] [place], [place], [address] Phone: [phone]   Fax:  [phone]  [name] MRN: [id number] Date of Birth: Nov 26, 1940

## 2017-12-14 ENCOUNTER — Encounter: Payer: Medicare Other | Admitting: Physical Therapy

## 2017-12-14 ENCOUNTER — Encounter: Payer: Self-pay | Admitting: Physical Therapy

## 2017-12-14 ENCOUNTER — Ambulatory Visit: Payer: Medicare Other | Admitting: Physical Therapy

## 2017-12-14 DIAGNOSIS — M6281 Muscle weakness (generalized): Secondary | ICD-10-CM | POA: Diagnosis not present

## 2017-12-14 DIAGNOSIS — R2689 Other abnormalities of gait and mobility: Secondary | ICD-10-CM | POA: Diagnosis not present

## 2017-12-14 DIAGNOSIS — M5440 Lumbago with sciatica, unspecified side: Secondary | ICD-10-CM | POA: Diagnosis not present

## 2017-12-14 NOTE — Therapy (Signed)
[place] MAIN [facility] SERVICES [address] [place], [place], [address] Phone: [phone]   Fax:  [phone]  Physical Therapy Treatment  Patient Details  [name] MRN: [id number] Date of Birth: 1940/10/11 Referring Provider: [name]    Encounter Date: 12/14/2017  PT End of Session - 12/14/17 1318    Visit Number  3    Number of Visits  17    Date for PT Re-Evaluation  02/02/18    PT Start Time  0107    PT Stop Time  0145    PT Time Calculation (min)  38 min    Equipment Utilized During Treatment  Gait belt    Activity Tolerance  Patient tolerated treatment well;Patient limited by pain    Behavior During Therapy  [facility] for tasks assessed/performed       Past Medical History:  Diagnosis Date  . Acute respiratory failure ([place]) 11/21/2014  . Anxiety   . Arthritis 08/05/2013  . Atherosclerotic peripheral vascular disease with gangrene [facility]) august 2012  . Cardiomyopathy, ischemic    a. EF 35 to 40% by echo in 2013 b. EF improved to 50-55% by echo in 04/2015; 11/2016 Echo: EF 55-60%, Gr1 DD, mildly dil LA, nl RV fxn.  . Cerebral infarct ([place]) 08/17/2013  . Chronic diastolic CHF (congestive heart failure) (HCC)    a. EF 50-55% by echo in 04/2015; b. 11/2016 Echo: EF 55-60%, Gr1 DD.  [name] COPD (chronic obstructive pulmonary disease) ([place])   . Coronary artery disease, occlusive    a. Previous PCI to the LAD, LCx, and RCA in 2010, 2011, 2013, and 2016. All performed in [place]; b. 11/2016 Lexiscan MV: EF 43%, no ischemia->low risk.  . 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 Diabetic diarrhea ([place]) 10/03/2014  . DM type 2, uncontrolled, with renal complications ([place]) 09/13/5623  . Hepatic steatosis    by CT abd pelvis  . History of kidney stones    at a younger age  . Hyperlipidemia LDL goal <100 02/23/2014  . Hypertensive heart disease   . Osteoporosis, post-menopausal   . 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 Pill esophagitis   . Pleural effusion 10/25/2012   10/2012 CT chest >> small to moderate R lung effusion>> chylothorax, 100% lymphs 10/2013 thoracentesis> cytology negative, WBC 1471, > 90% "small lymphs" 01/2014 CT chest> near complete resolution of pleural effusion, stable lymphadenopathy 08/2014 CT chest [place] ([facility]): small right sided effusion decreased in size, stable mediastinal lymphadenopathy 1.0cm largest, 58m  . 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 Sleep apnea   . Stage III chronic kidney disease [facility])     Past Surgical History:  Procedure Laterality Date  . ABDOMINAL HYSTERECTOMY     at ge 39. secondary to bleeding/partial  . ANTERIOR CERVICAL DECOMP/DISCECTOMY FUSION N/A 10/10/2016   Procedure: Anterior Cervical Discectomy Fusion - Cervical four4- five - Cervical five-Cervical six - Cervical six-Cervical seven;  Surgeon: Earnie Larsson, MD;  Location: [place];  Service: Neurosurgery;  Laterality: N/A;  . BREAST CYST ASPIRATION Right   . CARPAL TUNNEL RELEASE Bilateral   .  CHOLECYSTECTOMY     in [place]   . COLONOSCOPY WITH PROPOFOL N/A 01/09/2015   Procedure: COLONOSCOPY WITH PROPOFOL;  Surgeon: Lucilla Lame, MD;  Location: [facility] ENDOSCOPY;  Service: Endoscopy;  Laterality: N/A;  . CORONARY ANGIOPLASTY WITH STENT PLACEMENT     [place]; [facility]  . EYE SURGERY     bil cataracts  . HERNIA REPAIR     umbilical/ Dr Pat Patrick  . OOPHORECTOMY    . PTCA  August 2012   Right Posterior tibial artery , [place]  . TOE AMPUTATION  Sept 2012   Right 2nd toe, [place]    There were no vitals filed for this visit.  Subjective Assessment - 12/14/17 1317     Subjective  Patient is not having any back pain or LE pain today.     Pertinent History  Pamela Collier presents for evaluation of  intermittent  left sided leg pain that began without fall or injury several weeks ago.  She state that the pain started  In her left foot,  In the  Toes, and  has ascended up the lateral side of her left leg and is accompanied by stiffness of both legs.  She also describes back pain radiates into left hip and left leg.  No recent fall.       Currently in Pain?  No/denies    Pain Score  0-No pain            Therapeutic Exercise:  Standing exercises with RTB BLE :  SLR 2 x 10; cues for performing slowly  Abduction 2 x 10;cues for not leaning towards the opposite side  Extension 2 x 10; cues not to lean forward with trunk  Heel raises 2 x 10; cues to not rock forward  Eccentric step downs x 10 BLE; cues to not put too much weight bearing on UE's  Squats x 10 with 5 sec hold, cues to maintain erect position  Resisted side-steeping RTB 4 lengths x 2;; cues to not rotate trunk towards direction of mobility   Step-ups to 6" step x 10 bilateral; cues for getting entire foot on step  Quantum leg press 100 # x 20 x 3; cues for not snapping LE in extension and performing slowly    Ambulation x400 ft without AD with min guard.  Head turns with decreased step length with the additional task.  Slow and unsteady gait, but no LOB. Frequent cues for heel to toe pattern.                    PT Education - 12/14/17 1318    Education Details  HEP    Person(s) Educated  Patient    Methods  Explanation;Demonstration;Tactile cues;Verbal cues    Comprehension  Verbalized understanding;Verbal cues required       PT Short Term Goals - 12/08/17 0946      PT SHORT TERM GOAL #1   Title  Patient will be independent in home exercise program to improve strength/mobility for better functional independence with ADLs.    Time  4    Period  Weeks     Status  New    Target Date  01/05/18      PT SHORT TERM GOAL #2   Title  Patient will complete five times sit to stand test in < 15 seconds indicating an increased LE strength and improved balance.    Time  4    Period  Weeks    Status  New    Target Date  01/05/18        PT Long Term Goals - 12/08/17 0947      PT LONG TERM GOAL #1   Title  Patient will increase six minute walk test distance to >1000 for progression to community ambulator and improve gait ability    Time  8    Period  Weeks    Status  New    Target Date  02/02/18      PT LONG TERM GOAL #2   Title  Patient will increase 10 meter walk test to >1.32m/s as to improve gait speed for better community ambulation and to reduce fall risk.    Time  8    Period  Weeks    Status  New    Target Date  02/02/18      PT LONG TERM GOAL #3   Title  Patient will reduce timed up and go to <11 seconds to reduce fall risk and demonstrate improved transfer/gait ability.    Time  8    Period  Weeks    Status  New    Target Date  02/02/18      PT LONG TERM GOAL #4   Title  Patient will be require no assist with ascend/descend 3 steps using Least restrictive assistive device.    Time  8    Period  Weeks    Status  New    Target Date  02/02/18            Plan - 12/14/17 1319    Clinical Impression Statement  Patient required min verbal cueing during strengthening exercise to correct posture and form. Patient demonstrates ability to perform strengthening exercises with no pain but with moderate fatigue. Patient will continue to benefit from continued skilled therapy in order to improve dynamic standing balance and increase strength in order to decrease falls    Rehab Potential  Good    PT Frequency  2x / week    PT Duration  8 weeks    PT Treatment/Interventions  Aquatic Therapy;Therapeutic activities;Therapeutic exercise;Balance training;Neuromuscular re-education;Patient/family education;Manual techniques;Dry needling     PT Next Visit Plan  BLE strengthening, balance training    Consulted and Agree with Plan of Care  Patient       Patient will benefit from skilled therapeutic intervention in order to improve the following deficits and impairments:  Abnormal gait, Decreased balance, Decreased mobility, Difficulty walking, Dizziness, Pain, Impaired flexibility, Decreased activity tolerance  Visit Diagnosis: Acute right-sided low back pain with sciatica, sciatica laterality unspecified  Muscle weakness (generalized)  Other abnormalities of gait and mobility     Problem List Patient Active Problem List   Diagnosis Date Noted  . Restless legs 10/17/2017  . Leg pain 10/15/2017  . Normocytic anemia, not due to blood loss 09/27/2017  . Melena 09/25/2017  . Cognitive decline 08/16/2017  . Polypharmacy 07/28/2017  . Pneumonia 05/26/2017  . Pill esophagitis 05/23/2017  . Chronic kidney disease (CKD), stage I 11/15/2016  . History of fall 11/12/2016  . Osteoarthritis 11/10/2016  . Right arm pain 10/15/2016  . Cervical stenosis of spinal canal 10/10/2016  . Cervical radiculopathy at C7 07/02/2016  . Acute midline thoracic back pain 07/02/2016  . Epigastric hernia 04/01/2016  . Umbilical pain 26/33/3545  . CAD in native artery   . Type 2 DM with CKD stage 3 and hypertension ([place])   . Chronic renal insufficiency   .  Hypertensive heart disease   . OSA on CPAP 07/07/2015  . Hospital discharge follow-up 07/07/2015  . Chest pain 06/29/2015  . Pleural effusion, right   . Chronic diastolic CHF (congestive heart failure) ([place]) 04/18/2015  . Diabetic neuropathy ([place]) 02/21/2015  . Benign neoplasm of sigmoid colon   . Low back pain with radiation 04/04/2014  . Essential hypertension 04/04/2014  . Hyperlipidemia 02/23/2014  . Long-term use of high-risk medication 02/23/2014  . Other long term (current) drug therapy 02/23/2014  . Dermatophytic onychia 08/17/2013  . Stable angina ([place]) 08/17/2013  .  Cerebral infarct ([place]) 08/17/2013  . Bony exostosis 08/17/2013  . Obesity, unspecified 08/17/2013  . Arthritis 08/05/2013  . Unsteadiness on feet 03/24/2013  . History of colonic polyps 02/20/2013  . Sarcoidosis ([place]) 12/07/2012  . Dysphagia 12/07/2012  . Pulmonary nodule seen on imaging study 05/20/2012  . 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 Diabetic nephropathy associated with type 2 diabetes mellitus ([place]) 02/29/2012  . Cardiomyopathy, ischemic   . Hepatic steatosis   . Osteoporosis, post-menopausal   . Peripheral vascular disease due to secondary diabetes mellitus ([place])   . Double vessel coronary artery disease 12/14/2011    Alanson Puls, PT DPT 12/14/2017, 1:21 PM  [place] MAIN [facility] SERVICES [address] [place], [place], [address] Phone: [phone]   Fax:  [phone]  [name] MRN: [id number] Date of Birth: 09-22-1940

## 2017-12-16 ENCOUNTER — Ambulatory Visit: Payer: Medicare Other | Admitting: Physical Therapy

## 2017-12-16 ENCOUNTER — Encounter: Payer: Medicare Other | Admitting: Physical Therapy

## 2017-12-16 ENCOUNTER — Encounter: Payer: Self-pay | Admitting: Physical Therapy

## 2017-12-16 DIAGNOSIS — M6281 Muscle weakness (generalized): Secondary | ICD-10-CM | POA: Diagnosis not present

## 2017-12-16 DIAGNOSIS — R2689 Other abnormalities of gait and mobility: Secondary | ICD-10-CM

## 2017-12-16 DIAGNOSIS — M5440 Lumbago with sciatica, unspecified side: Secondary | ICD-10-CM | POA: Diagnosis not present

## 2017-12-16 NOTE — Therapy (Signed)
[place] MAIN [facility] SERVICES [address] [place], [place], [address] Phone: [phone]   Fax:  [phone]  Physical Therapy Treatment  Patient Details  [name] MRN: [id number] Date of Birth: 18-Aug-1940 Referring Provider: [name]    Encounter Date: 12/16/2017  PT End of Session - 12/16/17 1317    Visit Number  4    Number of Visits  17    Date for PT Re-Evaluation  02/02/18    PT Start Time  0110    PT Stop Time  0150    PT Time Calculation (min)  40 min    Equipment Utilized During Treatment  Gait belt    Activity Tolerance  Patient tolerated treatment well;Patient limited by pain    Behavior During Therapy  [facility] for tasks assessed/performed       Past Medical History:  Diagnosis Date  . Acute respiratory failure ([place]) 11/21/2014  . Anxiety   . Arthritis 08/05/2013  . Atherosclerotic peripheral vascular disease with gangrene [facility]) august 2012  . Cardiomyopathy, ischemic    a. EF 35 to 40% by echo in 2013 b. EF improved to 50-55% by echo in 04/2015; 11/2016 Echo: EF 55-60%, Gr1 DD, mildly dil LA, nl RV fxn.  . Cerebral infarct ([place]) 08/17/2013  . Chronic diastolic CHF (congestive heart failure) (HCC)    a. EF 50-55% by echo in 04/2015; b. 11/2016 Echo: EF 55-60%, Gr1 DD.  [name] COPD (chronic obstructive pulmonary disease) ([place])   . Coronary artery disease, occlusive    a. Previous PCI to the LAD, LCx, and RCA in 2010, 2011, 2013, and 2016. All performed in [place]; b. 11/2016 Lexiscan MV: EF 43%, no ischemia->low risk.  . 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 Diabetic diarrhea ([place]) 10/03/2014  . DM type 2, uncontrolled, with renal complications ([place]) 11/16/2421  . Hepatic steatosis    by CT abd pelvis  . History of kidney stones    at a younger age  . Hyperlipidemia LDL goal <100 02/23/2014  . Hypertensive heart disease   . Osteoporosis, post-menopausal   . 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 Pill esophagitis   . Pleural effusion 10/25/2012   10/2012 CT chest >> small to moderate R lung effusion>> chylothorax, 100% lymphs 10/2013 thoracentesis> cytology negative, WBC 1471, > 90% "small lymphs" 01/2014 CT chest> near complete resolution of pleural effusion, stable lymphadenopathy 08/2014 CT chest [place] ([facility]): small right sided effusion decreased in size, stable mediastinal lymphadenopathy 1.0cm largest, 49m  . 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 Sleep apnea   . Stage III chronic kidney disease [facility])     Past Surgical History:  Procedure Laterality Date  . ABDOMINAL HYSTERECTOMY     at ge 39. secondary to bleeding/partial  . ANTERIOR CERVICAL DECOMP/DISCECTOMY FUSION N/A 10/10/2016   Procedure: Anterior Cervical Discectomy Fusion - Cervical four4- five - Cervical five-Cervical six - Cervical six-Cervical seven;  Surgeon: Earnie Larsson, MD;  Location: [place];  Service: Neurosurgery;  Laterality: N/A;  . BREAST CYST ASPIRATION Right   . CARPAL TUNNEL RELEASE Bilateral   .  CHOLECYSTECTOMY     in [place]   . COLONOSCOPY WITH PROPOFOL N/A 01/09/2015   Procedure: COLONOSCOPY WITH PROPOFOL;  Surgeon: Lucilla Lame, MD;  Location: [facility];  Service: Endoscopy;  Laterality: N/A;  . CORONARY ANGIOPLASTY WITH STENT PLACEMENT     [place]; [facility]  . EYE SURGERY     bil cataracts  . HERNIA REPAIR     umbilical/ Dr Pat Patrick  . OOPHORECTOMY    . PTCA  August 2012   Right Posterior tibial artery , [place]  . TOE AMPUTATION  Sept 2012   Right 2nd toe, [place]    There were no vitals filed for this visit.  Subjective Assessment - 12/16/17 1316     Subjective  Patient is not having any back pain or LE pain today. She was sore yesterday from all the exercises.     Pertinent History  ZULEIMA HASER presents for evaluation of  intermittent  left sided leg pain that began without fall or injury several weeks ago.  She state that the pain started  In her left foot,  In the  Toes, and  has ascended up the lateral side of her left leg and is accompanied by stiffness of both legs.  She also describes back pain radiates into left hip and left leg.  No recent fall.       Currently in Pain?  No/denies    Pain Score  0-No pain          Therapeutic Exercise:   SAQ with 3 lbs and 3 sec hold x 20 x 2  Bridges x 10 x 2  Hooklying abd/ER with GTB x 20   sidelying hip abd x 15 with 3 lbs BLE  TM elevation of 1, 1. 2 miles / hour x 5 mins with fatigue following:  Standing and with RTB Abduction 2 x 10;cues for not leaning towards the opposite side  Standing with RTB and Extension 2 x 10; cues not to lean forward with trunk  Heel raises 2 x 10; cues to not rock forward  Step-ups to 6" step x 10 bilateral; cues for getting entire foot on step  Quantum leg press 100 # x 20 x 3; cues for not snapping LE in extension and performing slowly   Patient needs cues for correct technique and form.                        PT Education - 12/16/17 1317    Education Details  HEP    Person(s) Educated  Patient    Methods  Explanation    Comprehension  Verbalized understanding       PT Short Term Goals - 12/08/17 0946      PT SHORT TERM GOAL #1   Title  Patient will be independent in home exercise program to improve strength/mobility for better functional independence with ADLs.    Time  4    Period  Weeks    Status  New    Target Date  01/05/18      PT SHORT TERM GOAL #2   Title  Patient will complete five times sit to stand test in < 15 seconds indicating an increased LE strength and improved balance.    Time  4     Period  Weeks    Status  New    Target Date  01/05/18        PT Long  Term Goals - 12/08/17 0947      PT LONG TERM GOAL #1   Title  Patient will increase six minute walk test distance to >1000 for progression to community ambulator and improve gait ability    Time  8    Period  Weeks    Status  New    Target Date  02/02/18      PT LONG TERM GOAL #2   Title  Patient will increase 10 meter walk test to >1.69m/s as to improve gait speed for better community ambulation and to reduce fall risk.    Time  8    Period  Weeks    Status  New    Target Date  02/02/18      PT LONG TERM GOAL #3   Title  Patient will reduce timed up and go to <11 seconds to reduce fall risk and demonstrate improved transfer/gait ability.    Time  8    Period  Weeks    Status  New    Target Date  02/02/18      PT LONG TERM GOAL #4   Title  Patient will be require no assist with ascend/descend 3 steps using Least restrictive assistive device.    Time  8    Period  Weeks    Status  New    Target Date  02/02/18            Plan - 12/16/17 1318    Clinical Impression Statement  Pt requires direction and verbal cues for correct performance of exercises. Patient demonstrates weakness in BLE and performs open and closed chain exercises with minimal reports of pain to left knee. Pt was able to perform all exercises with max assist and VC for technique..   Patient struggles with speed during movement as well as balance with unstable surfaces.  Pt encouraged continuing new supine HEP .Follow-up as scheduled.    Rehab Potential  Good    PT Frequency  2x / week    PT Duration  8 weeks    PT Treatment/Interventions  Aquatic Therapy;Therapeutic activities;Therapeutic exercise;Balance training;Neuromuscular re-education;Patient/family education;Manual techniques;Dry needling    PT Next Visit Plan  BLE strengthening, balance training    Consulted and Agree with Plan of Care  Patient       Patient will benefit  from skilled therapeutic intervention in order to improve the following deficits and impairments:  Abnormal gait, Decreased balance, Decreased mobility, Difficulty walking, Dizziness, Pain, Impaired flexibility, Decreased activity tolerance  Visit Diagnosis: Acute right-sided low back pain with sciatica, sciatica laterality unspecified  Muscle weakness (generalized)  Other abnormalities of gait and mobility     Problem List Patient Active Problem List   Diagnosis Date Noted  . Restless legs 10/17/2017  . Leg pain 10/15/2017  . Normocytic anemia, not due to blood loss 09/27/2017  . Melena 09/25/2017  . Cognitive decline 08/16/2017  . Polypharmacy 07/28/2017  . Pneumonia 05/26/2017  . Pill esophagitis 05/23/2017  . Chronic kidney disease (CKD), stage I 11/15/2016  . History of fall 11/12/2016  . Osteoarthritis 11/10/2016  . Right arm pain 10/15/2016  . Cervical stenosis of spinal canal 10/10/2016  . Cervical radiculopathy at C7 07/02/2016  . Acute midline thoracic back pain 07/02/2016  . Epigastric hernia 04/01/2016  . Umbilical pain 89/21/1941  . CAD in native artery   . Type 2 DM with CKD stage 3 and hypertension ([place])   . Chronic renal insufficiency   . Hypertensive heart  disease   . OSA on CPAP 07/07/2015  . Hospital discharge follow-up 07/07/2015  . Chest pain 06/29/2015  . Pleural effusion, right   . Chronic diastolic CHF (congestive heart failure) ([place]) 04/18/2015  . Diabetic neuropathy ([place]) 02/21/2015  . Benign neoplasm of sigmoid colon   . Low back pain with radiation 04/04/2014  . Essential hypertension 04/04/2014  . Hyperlipidemia 02/23/2014  . Long-term use of high-risk medication 02/23/2014  . Other long term (current) drug therapy 02/23/2014  . Dermatophytic onychia 08/17/2013  . Stable angina ([place]) 08/17/2013  . Cerebral infarct ([place]) 08/17/2013  . Bony exostosis 08/17/2013  . Obesity, unspecified 08/17/2013  . Arthritis 08/05/2013  . Unsteadiness  on feet 03/24/2013  . History of colonic polyps 02/20/2013  . Sarcoidosis ([place]) 12/07/2012  . Dysphagia 12/07/2012  . Pulmonary nodule seen on imaging study 05/20/2012  . 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 Diabetic nephropathy associated with type 2 diabetes mellitus ([place]) 02/29/2012  . Cardiomyopathy, ischemic   . Hepatic steatosis   . Osteoporosis, post-menopausal   . Peripheral vascular disease due to secondary diabetes mellitus ([place])   . Double vessel coronary artery disease 12/14/2011    Alanson Puls, PT DPT 12/16/2017, 1:20 PM  [place] MAIN [facility] SERVICES [address] [place], [place], [address] Phone: [phone]   Fax:  [phone]  Name: GENIEVE RAMASWAMY MRN: [id number] Date of Birth: 24-Jul-1940

## 2017-12-21 ENCOUNTER — Ambulatory Visit: Payer: Medicare Other | Admitting: Physical Therapy

## 2017-12-23 ENCOUNTER — Ambulatory Visit: Payer: Medicare Other

## 2017-12-23 ENCOUNTER — Encounter: Payer: Medicare Other | Admitting: Physical Therapy

## 2017-12-23 VITALS — BP 115/36 | HR 70

## 2017-12-23 DIAGNOSIS — M6281 Muscle weakness (generalized): Secondary | ICD-10-CM

## 2017-12-23 DIAGNOSIS — R2689 Other abnormalities of gait and mobility: Secondary | ICD-10-CM | POA: Diagnosis not present

## 2017-12-23 DIAGNOSIS — M5440 Lumbago with sciatica, unspecified side: Secondary | ICD-10-CM

## 2017-12-23 NOTE — Therapy (Signed)
[place] MAIN [facility] SERVICES [address] [place], [place], [address] Phone: [phone]   Fax:  [phone]  Physical Therapy Treatment  Patient Details  [name] MRN: [id number] Date of Birth: 1940-09-20 Referring Provider: [name]    Encounter Date: 12/23/2017  PT End of Session - 12/23/17 1308    Visit Number  5    Number of Visits  17    Date for PT Re-Evaluation  02/02/18    PT Start Time  1300    PT Stop Time  1345    PT Time Calculation (min)  45 min    Equipment Utilized During Treatment  Gait belt    Activity Tolerance  Patient tolerated treatment well    Behavior During Therapy  [facility] for tasks assessed/performed       Past Medical History:  Diagnosis Date  . Acute respiratory failure ([place]) 11/21/2014  . Anxiety   . Arthritis 08/05/2013  . Atherosclerotic peripheral vascular disease with gangrene [facility]) august 2012  . Cardiomyopathy, ischemic    a. EF 35 to 40% by echo in 2013 b. EF improved to 50-55% by echo in 04/2015; 11/2016 Echo: EF 55-60%, Gr1 DD, mildly dil LA, nl RV fxn.  . Cerebral infarct ([place]) 08/17/2013  . Chronic diastolic CHF (congestive heart failure) (HCC)    a. EF 50-55% by echo in 04/2015; b. 11/2016 Echo: EF 55-60%, Gr1 DD.  [name] COPD (chronic obstructive pulmonary disease) ([place])   . Coronary artery disease, occlusive    a. Previous PCI to the LAD, LCx, and RCA in 2010, 2011, 2013, and 2016. All performed in [place]; b. 11/2016 Lexiscan MV: EF 43%, no ischemia->low risk.  . 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 Diabetic diarrhea ([place]) 10/03/2014  . DM type 2, uncontrolled, with renal complications ([place]) 06/13/2977  . Hepatic steatosis    by CT abd pelvis  . History of kidney stones    at a younger age  . Hyperlipidemia LDL goal <100 02/23/2014  . Hypertensive heart disease   . Osteoporosis, post-menopausal   . Peripheral vascular disease due to secondary diabetes mellitus [facility]) July 2011   s/p  right 2nd toe amputation for gangrene  . Pill esophagitis   . Pleural effusion 10/25/2012   10/2012 CT chest >> small to moderate R lung effusion>> chylothorax, 100% lymphs 10/2013 thoracentesis> cytology negative, WBC 1471, > 90% "small lymphs" 01/2014 CT chest> near complete resolution of pleural effusion, stable lymphadenopathy 08/2014 CT chest [place] ([facility]): small right sided effusion decreased in size, stable mediastinal lymphadenopathy 1.0cm largest, 40m  . 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 Sleep apnea   . Stage III chronic kidney disease [facility])     Past Surgical History:  Procedure Laterality Date  . ABDOMINAL HYSTERECTOMY     at ge 39. secondary to bleeding/partial  . ANTERIOR CERVICAL DECOMP/DISCECTOMY FUSION N/A 10/10/2016   Procedure: Anterior Cervical Discectomy Fusion - Cervical four4- five - Cervical five-Cervical six - Cervical six-Cervical seven;  Surgeon: Earnie Larsson, MD;  Location: [place];  Service: Neurosurgery;  Laterality: N/A;  . BREAST CYST ASPIRATION Right   . CARPAL TUNNEL RELEASE Bilateral   . CHOLECYSTECTOMY  in [place]   . COLONOSCOPY WITH PROPOFOL N/A 01/09/2015   Procedure: COLONOSCOPY WITH PROPOFOL;  Surgeon: Lucilla Lame, MD;  Location: [facility] ENDOSCOPY;  Service: Endoscopy;  Laterality: N/A;  . CORONARY ANGIOPLASTY WITH STENT PLACEMENT     [place]; [facility]  . EYE SURGERY     bil cataracts  . HERNIA REPAIR     umbilical/ Dr Pat Patrick  . OOPHORECTOMY    . PTCA  August 2012   Right Posterior tibial artery , [place]  . TOE AMPUTATION  Sept 2012   Right 2nd toe, [place]    Vitals:   12/23/17 1305  BP: (!) 115/36  Pulse: 70  SpO2: 99%    Subjective Assessment - 12/23/17 1307     Subjective  Pt reports that she had a lot of company this weekend and is tired today. She denies pain in back or leg currently but did have some leg pain last night. No specific questions or concerns currently.     Pertinent History  Pamela Collier presents for evaluation of  intermittent  left sided leg pain that began without fall or injury several weeks ago.  She state that the pain started  In her left foot,  In the  Toes, and  has ascended up the lateral side of her left leg and is accompanied by stiffness of both legs.  She also describes back pain radiates into left hip and left leg.  No recent fall.       Currently in Pain?  No/denies           TREATMENT  Ther-ex  NuStep L2 x 4 minutes for warm-up (unbilled); Quantum leg press 120 # 2 x 20, fatigue monitored in order to find proper weight; Bridges 2 x 20 with 2 sec hold at top; Hooklying clams with GTB 2 x 20; LAQ with 3 lbs and 2s hold 2 x 20; Seated HS curls with GTB 2 x 20; Heel/toe rocks x 15 each direction; Standing mini squats in // bars with UE support 2 x 10;  Patient needs cues for correct technique and form.                     PT Education - 12/23/17 1308    Education Details  exercise form/technique    Person(s) Educated  Patient    Methods  Explanation    Comprehension  Verbalized understanding       PT Short Term Goals - 12/08/17 0946      PT SHORT TERM GOAL #1   Title  Patient will be independent in home exercise program to improve strength/mobility for better functional independence with ADLs.    Time  4    Period  Weeks    Status  New    Target Date  01/05/18      PT SHORT TERM GOAL #2   Title  Patient will complete five times sit to stand test in < 15 seconds indicating an increased LE strength and improved balance.    Time  4    Period  Weeks    Status  New    Target Date  01/05/18        PT Long Term Goals - 12/08/17 0947      PT LONG TERM GOAL #1   Title   Patient will increase six minute walk test distance to >1000 for progression to community ambulator and improve gait ability  Time  8    Period  Weeks    Status  New    Target Date  02/02/18      PT LONG TERM GOAL #2   Title  Patient will increase 10 meter walk test to >1.42m/s as to improve gait speed for better community ambulation and to reduce fall risk.    Time  8    Period  Weeks    Status  New    Target Date  02/02/18      PT LONG TERM GOAL #3   Title  Patient will reduce timed up and go to <11 seconds to reduce fall risk and demonstrate improved transfer/gait ability.    Time  8    Period  Weeks    Status  New    Target Date  02/02/18      PT LONG TERM GOAL #4   Title  Patient will be require no assist with ascend/descend 3 steps using Least restrictive assistive device.    Time  8    Period  Weeks    Status  New    Target Date  02/02/18            Plan - 12/23/17 1308    Clinical Impression Statement  Pt demonstrates excellent motivation during session and does not appear overly fatigued with exercise. She does require frequent redirection during exercise. No increase in pain reported during session. Patient demonstrates weakness in BLE during exercises however she is able to increase her resistance with leg press today. Pt encouraged to continue HEP and follow-up as scheduled. Pt will benefit from PT services to address deficits in strength and pain in order to return to full function at home.     Rehab Potential  Good    PT Frequency  2x / week    PT Duration  8 weeks    PT Treatment/Interventions  Aquatic Therapy;Therapeutic activities;Therapeutic exercise;Balance training;Neuromuscular re-education;Patient/family education;Manual techniques;Dry needling    PT Next Visit Plan  BLE strengthening, balance training    Consulted and Agree with Plan of Care  Patient       Patient will benefit from skilled therapeutic intervention in order to improve the following  deficits and impairments:  Abnormal gait, Decreased balance, Decreased mobility, Difficulty walking, Dizziness, Pain, Impaired flexibility, Decreased activity tolerance  Visit Diagnosis: Acute right-sided low back pain with sciatica, sciatica laterality unspecified  Muscle weakness (generalized)     Problem List Patient Active Problem List   Diagnosis Date Noted  . Restless legs 10/17/2017  . Leg pain 10/15/2017  . Normocytic anemia, not due to blood loss 09/27/2017  . Melena 09/25/2017  . Cognitive decline 08/16/2017  . Polypharmacy 07/28/2017  . Pneumonia 05/26/2017  . Pill esophagitis 05/23/2017  . Chronic kidney disease (CKD), stage I 11/15/2016  . History of fall 11/12/2016  . Osteoarthritis 11/10/2016  . Right arm pain 10/15/2016  . Cervical stenosis of spinal canal 10/10/2016  . Cervical radiculopathy at C7 07/02/2016  . Acute midline thoracic back pain 07/02/2016  . Epigastric hernia 04/01/2016  . Umbilical pain 57/84/6962  . CAD in native artery   . Type 2 DM with CKD stage 3 and hypertension ([place])   . Chronic renal insufficiency   . Hypertensive heart disease   . OSA on CPAP 07/07/2015  . Hospital discharge follow-up 07/07/2015  . Chest pain 06/29/2015  . Pleural effusion, right   . Chronic diastolic CHF (congestive heart failure) ([place]) 04/18/2015  . Diabetic neuropathy (  [place]) 02/21/2015  . Benign neoplasm of sigmoid colon   . Low back pain with radiation 04/04/2014  . Essential hypertension 04/04/2014  . Hyperlipidemia 02/23/2014  . Long-term use of high-risk medication 02/23/2014  . Other long term (current) drug therapy 02/23/2014  . Dermatophytic onychia 08/17/2013  . Stable angina ([place]) 08/17/2013  . Cerebral infarct ([place]) 08/17/2013  . Bony exostosis 08/17/2013  . Obesity, unspecified 08/17/2013  . Arthritis 08/05/2013  . Unsteadiness on feet 03/24/2013  . History of colonic polyps 02/20/2013  . Sarcoidosis ([place]) 12/07/2012  . Dysphagia 12/07/2012   . Pulmonary nodule seen on imaging study 05/20/2012  . 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 Diabetic nephropathy associated with type 2 diabetes mellitus ([place]) 02/29/2012  . Cardiomyopathy, ischemic   . Hepatic steatosis   . Osteoporosis, post-menopausal   . Peripheral vascular disease due to secondary diabetes mellitus ([place])   . Double vessel coronary artery disease 12/14/2011   Lyndel Safe Rand Etchison PT, DPT, GCS  [place] Muzquiz 12/23/2017, 2:15 PM  [place] MAIN [facility] SERVICES [address] [place], [place], [address] Phone: [phone]   Fax:  [phone]  [name] MRN: [id number] Date of Birth: 08-16-1940

## 2017-12-24 ENCOUNTER — Other Ambulatory Visit: Payer: Self-pay | Admitting: Internal Medicine

## 2017-12-24 DIAGNOSIS — I255 Ischemic cardiomyopathy: Secondary | ICD-10-CM

## 2017-12-28 ENCOUNTER — Encounter: Payer: Self-pay | Admitting: Physical Therapy

## 2017-12-28 ENCOUNTER — Ambulatory Visit: Payer: Medicare Other | Admitting: Physical Therapy

## 2017-12-28 ENCOUNTER — Encounter: Payer: Medicare Other | Admitting: Physical Therapy

## 2017-12-28 DIAGNOSIS — M5440 Lumbago with sciatica, unspecified side: Secondary | ICD-10-CM

## 2017-12-28 DIAGNOSIS — R2689 Other abnormalities of gait and mobility: Secondary | ICD-10-CM | POA: Diagnosis not present

## 2017-12-28 DIAGNOSIS — M6281 Muscle weakness (generalized): Secondary | ICD-10-CM | POA: Diagnosis not present

## 2017-12-28 NOTE — Therapy (Signed)
[place] MAIN [facility] SERVICES [address] [place], [place], [address] Phone: [phone]   Fax:  [phone]  Physical Therapy Treatment  Patient Details  [name] MRN: [id number] Date of Birth: Jun 09, 1940 Referring Provider: [name]    Encounter Date: 12/28/2017  PT End of Session - 12/28/17 0943    Visit Number  6    Number of Visits  17    Date for PT Re-Evaluation  02/02/18    PT Start Time  0930    PT Stop Time  1010    PT Time Calculation (min)  40 min    Equipment Utilized During Treatment  Gait belt    Activity Tolerance  Patient tolerated treatment well    Behavior During Therapy  [facility] for tasks assessed/performed       Past Medical History:  Diagnosis Date  . Acute respiratory failure ([place]) 11/21/2014  . Anxiety   . Arthritis 08/05/2013  . Atherosclerotic peripheral vascular disease with gangrene [facility]) august 2012  . Cardiomyopathy, ischemic    a. EF 35 to 40% by echo in 2013 b. EF improved to 50-55% by echo in 04/2015; 11/2016 Echo: EF 55-60%, Gr1 DD, mildly dil LA, nl RV fxn.  . Cerebral infarct ([place]) 08/17/2013  . Chronic diastolic CHF (congestive heart failure) (HCC)    a. EF 50-55% by echo in 04/2015; b. 11/2016 Echo: EF 55-60%, Gr1 DD.  [name] COPD (chronic obstructive pulmonary disease) ([place])   . Coronary artery disease, occlusive    a. Previous PCI to the LAD, LCx, and RCA in 2010, 2011, 2013, and 2016. All performed in [place]; b. 11/2016 Lexiscan MV: EF 43%, no ischemia->low risk.  . 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 Diabetic diarrhea ([place]) 10/03/2014  . DM type 2, uncontrolled, with renal complications ([place]) 10/14/3327  . Hepatic steatosis    by CT abd pelvis  . History of kidney stones    at a younger age  . Hyperlipidemia LDL goal <100 02/23/2014  . Hypertensive heart disease   . Osteoporosis, post-menopausal   . Peripheral vascular disease due to secondary diabetes mellitus [facility]) July 2011   s/p  right 2nd toe amputation for gangrene  . Pill esophagitis   . Pleural effusion 10/25/2012   10/2012 CT chest >> small to moderate R lung effusion>> chylothorax, 100% lymphs 10/2013 thoracentesis> cytology negative, WBC 1471, > 90% "small lymphs" 01/2014 CT chest> near complete resolution of pleural effusion, stable lymphadenopathy 08/2014 CT chest [place] ([facility]): small right sided effusion decreased in size, stable mediastinal lymphadenopathy 1.0cm largest, 19m  . 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 Sleep apnea   . Stage III chronic kidney disease [facility])     Past Surgical History:  Procedure Laterality Date  . ABDOMINAL HYSTERECTOMY     at ge 39. secondary to bleeding/partial  . ANTERIOR CERVICAL DECOMP/DISCECTOMY FUSION N/A 10/10/2016   Procedure: Anterior Cervical Discectomy Fusion - Cervical four4- five - Cervical five-Cervical six - Cervical six-Cervical seven;  Surgeon: Earnie Larsson, MD;  Location: [place];  Service: Neurosurgery;  Laterality: N/A;  . BREAST CYST ASPIRATION Right   . CARPAL TUNNEL RELEASE Bilateral   . CHOLECYSTECTOMY  in [place]   . COLONOSCOPY WITH PROPOFOL N/A 01/09/2015   Procedure: COLONOSCOPY WITH PROPOFOL;  Surgeon: Lucilla Lame, MD;  Location: [facility] ENDOSCOPY;  Service: Endoscopy;  Laterality: N/A;  . CORONARY ANGIOPLASTY WITH STENT PLACEMENT     [place]; [facility]  . EYE SURGERY     bil cataracts  . HERNIA REPAIR     umbilical/ Dr Pat Patrick  . OOPHORECTOMY    . PTCA  August 2012   Right Posterior tibial artery , [place]  . TOE AMPUTATION  Sept 2012   Right 2nd toe, [place]    There were no vitals filed for this visit.  Subjective Assessment - 12/28/17 0942    Subjective  Pt reports  that she has leg pain at night. She denies pain in back or leg currently but did have some leg pain last night. No specific questions or concerns currently.     Pertinent History  Pamela Collier presents for evaluation of  intermittent  left sided leg pain that began without fall or injury several weeks ago.  She state that the pain started  In her left foot,  In the  Toes, and  has ascended up the lateral side of her left leg and is accompanied by stiffness of both legs.  She also describes back pain radiates into left hip and left leg.  No recent fall.       Currently in Pain?  No/denies    Pain Score  0-No pain       TREATMENT: Warm up on Nustep BUE/BLE level 2 x5 min (unbilled);   Leg press: BLE 100# 2x15 with cues to slow down LE movement for better strengthening;   SLS with 3 lbs R: 3 sec unsupported, L: 10 sec unsupported;   Forward lunges x5 bilaterally with cues for foot placement and to avoid full knee flexion;   Standing: Green tband around both legs: Hip abduction x10 bilaterally; Hip extension x10 bilaterally; required cues to keep knee straight for better hip extension strengthening Side stepping 10 feet x3 laps each direction;   Sitting: Green tband ankle DF 2x10 with cues to keep feet apart for better resistance on ankles;  Hip flexion march x15 with cues to do while sitting in chair with good back support for less back discomfort; Hip abduction/ER x15   Patient required min Vcs to slow down LE movement and to improve posture for better hip strengthening;                        PT Education - 12/28/17 0943    Education Details  HEP    Person(s) Educated  Patient    Methods  Explanation    Comprehension  Verbalized understanding       PT Short Term Goals - 12/08/17 0946      PT SHORT TERM GOAL #1   Title  Patient will be independent in home exercise program to improve strength/mobility for better functional independence with ADLs.     Time  4    Period  Weeks    Status  New    Target Date  01/05/18      PT SHORT TERM GOAL #2   Title  Patient will complete five times sit to stand test in < 15 seconds indicating an increased LE strength and improved balance.    Time  4    Period  Weeks    Status  New  Target Date  01/05/18        PT Long Term Goals - 12/08/17 0947      PT LONG TERM GOAL #1   Title  Patient will increase six minute walk test distance to >1000 for progression to community ambulator and improve gait ability    Time  8    Period  Weeks    Status  New    Target Date  02/02/18      PT LONG TERM GOAL #2   Title  Patient will increase 10 meter walk test to >1.93m/s as to improve gait speed for better community ambulation and to reduce fall risk.    Time  8    Period  Weeks    Status  New    Target Date  02/02/18      PT LONG TERM GOAL #3   Title  Patient will reduce timed up and go to <11 seconds to reduce fall risk and demonstrate improved transfer/gait ability.    Time  8    Period  Weeks    Status  New    Target Date  02/02/18      PT LONG TERM GOAL #4   Title  Patient will be require no assist with ascend/descend 3 steps using Least restrictive assistive device.    Time  8    Period  Weeks    Status  New    Target Date  02/02/18            Plan - 12/28/17 0949    Clinical Impression Statement  Patient presents with decreased gait speed, and decreased BLE strength with leg pain at night.  Patient's main and complaint is BLE weakness and inability to participate in desired activities. Patient wants to improve strength and ability to ambulate on indoor surfaces safely. Patient will benefit from skilled PT in order to increase gait speed, increase BLE strength, to decrease risk for falls and enable patient to participate in desired activities.    Rehab Potential  Good    PT Frequency  2x / week    PT Duration  8 weeks    PT Treatment/Interventions  Aquatic Therapy;Therapeutic  activities;Therapeutic exercise;Balance training;Neuromuscular re-education;Patient/family education;Manual techniques;Dry needling    PT Next Visit Plan  BLE strengthening, balance training    Consulted and Agree with Plan of Care  Patient       Patient will benefit from skilled therapeutic intervention in order to improve the following deficits and impairments:  Abnormal gait, Decreased balance, Decreased mobility, Difficulty walking, Dizziness, Pain, Impaired flexibility, Decreased activity tolerance  Visit Diagnosis: Acute right-sided low back pain with sciatica, sciatica laterality unspecified  Muscle weakness (generalized)  Other abnormalities of gait and mobility     Problem List Patient Active Problem List   Diagnosis Date Noted  . Restless legs 10/17/2017  . Leg pain 10/15/2017  . Normocytic anemia, not due to blood loss 09/27/2017  . Melena 09/25/2017  . Cognitive decline 08/16/2017  . Polypharmacy 07/28/2017  . Pneumonia 05/26/2017  . Pill esophagitis 05/23/2017  . Chronic kidney disease (CKD), stage I 11/15/2016  . History of fall 11/12/2016  . Osteoarthritis 11/10/2016  . Right arm pain 10/15/2016  . Cervical stenosis of spinal canal 10/10/2016  . Cervical radiculopathy at C7 07/02/2016  . Acute midline thoracic back pain 07/02/2016  . Epigastric hernia 04/01/2016  . Umbilical pain 00/17/4944  . CAD in native artery   . Type 2 DM with CKD stage 3  and hypertension ([place])   . Chronic renal insufficiency   . Hypertensive heart disease   . OSA on CPAP 07/07/2015  . Hospital discharge follow-up 07/07/2015  . Chest pain 06/29/2015  . Pleural effusion, right   . Chronic diastolic CHF (congestive heart failure) ([place]) 04/18/2015  . Diabetic neuropathy ([place]) 02/21/2015  . Benign neoplasm of sigmoid colon   . Low back pain with radiation 04/04/2014  . Essential hypertension 04/04/2014  . Hyperlipidemia 02/23/2014  . Long-term use of high-risk medication 02/23/2014   . Other long term (current) drug therapy 02/23/2014  . Dermatophytic onychia 08/17/2013  . Stable angina ([place]) 08/17/2013  . Cerebral infarct ([place]) 08/17/2013  . Bony exostosis 08/17/2013  . Obesity, unspecified 08/17/2013  . Arthritis 08/05/2013  . Unsteadiness on feet 03/24/2013  . History of colonic polyps 02/20/2013  . Sarcoidosis ([place]) 12/07/2012  . Dysphagia 12/07/2012  . Pulmonary nodule seen on imaging study 05/20/2012  . 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 Diabetic nephropathy associated with type 2 diabetes mellitus ([place]) 02/29/2012  . Cardiomyopathy, ischemic   . Hepatic steatosis   . Osteoporosis, post-menopausal   . Peripheral vascular disease due to secondary diabetes mellitus ([place])   . Double vessel coronary artery disease 12/14/2011    Alanson Puls , PT DPT 12/28/2017, 10:17 AM  [place] MAIN [facility] SERVICES [address] [place], [place], [address] Phone: [phone]   Fax:  [phone]  [name] MRN: [id number] Date of Birth: [birth year]

## 2017-12-30 ENCOUNTER — Encounter: Payer: Medicare Other | Admitting: Physical Therapy

## 2017-12-30 ENCOUNTER — Encounter: Payer: Self-pay | Admitting: Physical Therapy

## 2017-12-30 ENCOUNTER — Ambulatory Visit: Payer: Medicare Other | Admitting: Physical Therapy

## 2017-12-30 DIAGNOSIS — M6281 Muscle weakness (generalized): Secondary | ICD-10-CM | POA: Diagnosis not present

## 2017-12-30 DIAGNOSIS — M5440 Lumbago with sciatica, unspecified side: Secondary | ICD-10-CM

## 2017-12-30 DIAGNOSIS — R2689 Other abnormalities of gait and mobility: Secondary | ICD-10-CM | POA: Diagnosis not present

## 2017-12-30 NOTE — Therapy (Signed)
[place] MAIN [facility] SERVICES [address] [place], [place], [address] Phone: [phone]   Fax:  [phone]  Physical Therapy Treatment  Patient Details  [name] MRN: [id number] Date of Birth: [birth year] Referring Provider: [name]    Encounter Date: 12/30/2017  PT End of Session - 12/30/17 0925    Visit Number  7    Number of Visits  17    Date for PT Re-Evaluation  02/02/18    PT Start Time  0855    PT Stop Time  0933    PT Time Calculation (min)  38 min    Equipment Utilized During Treatment  Gait belt    Activity Tolerance  Patient tolerated treatment well    Behavior During Therapy  [facility] for tasks assessed/performed       Past Medical History:  Diagnosis Date  . Acute respiratory failure ([place]) 11/21/2014  . Anxiety   . Arthritis 08/05/2013  . Atherosclerotic peripheral vascular disease with gangrene [facility]) august 2012  . Cardiomyopathy, ischemic    a. EF 35 to 40% by echo in 2013 b. EF improved to 50-55% by echo in 04/2015; 11/2016 Echo: EF 55-60%, Gr1 DD, mildly dil LA, nl RV fxn.  . Cerebral infarct ([place]) 08/17/2013  . Chronic diastolic CHF (congestive heart failure) (HCC)    a. EF 50-55% by echo in 04/2015; b. 11/2016 Echo: EF 55-60%, Gr1 DD.  [name] COPD (chronic obstructive pulmonary disease) ([place])   . Coronary artery disease, occlusive    a. Previous PCI to the LAD, LCx, and RCA in 2010, 2011, 2013, and 2016. All performed in [place]; b. 11/2016 Lexiscan MV: EF 43%, no ischemia->low risk.  . 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 Diabetic diarrhea ([place]) 10/03/2014  . DM type 2, uncontrolled, with renal complications ([place]) 08/10/9377  . Hepatic steatosis    by CT abd pelvis  . History of kidney stones    at a younger age  . Hyperlipidemia LDL goal <100 02/23/2014  . Hypertensive heart disease   . Osteoporosis, post-menopausal   . 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 Pill esophagitis   . Pleural effusion 10/25/2012   10/2012 CT chest >> small to moderate R lung effusion>> chylothorax, 100% lymphs 10/2013 thoracentesis> cytology negative, WBC 1471, > 90% "small lymphs" 01/2014 CT chest> near complete resolution of pleural effusion, stable lymphadenopathy 08/2014 CT chest [place] ([facility]): small right sided effusion decreased in size, stable mediastinal lymphadenopathy 1.0cm largest, 48m  . 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 Sleep apnea   . Stage III chronic kidney disease [facility])     Past Surgical History:  Procedure Laterality Date  . ABDOMINAL HYSTERECTOMY     at ge 39. secondary to bleeding/partial  . ANTERIOR CERVICAL DECOMP/DISCECTOMY FUSION N/A 10/10/2016   Procedure: Anterior Cervical Discectomy Fusion - Cervical four4- five - Cervical five-Cervical six - Cervical six-Cervical seven;  Surgeon: Earnie Larsson, MD;  Location: [place];  Service: Neurosurgery;  Laterality: N/A;  . BREAST CYST ASPIRATION Right   . CARPAL TUNNEL RELEASE Bilateral   . CHOLECYSTECTOMY  in [place]   . COLONOSCOPY WITH PROPOFOL N/A 01/09/2015   Procedure: COLONOSCOPY WITH PROPOFOL;  Surgeon: Lucilla Lame, MD;  Location: [facility] ENDOSCOPY;  Service: Endoscopy;  Laterality: N/A;  . CORONARY ANGIOPLASTY WITH STENT PLACEMENT     [place]; [facility]  . EYE SURGERY     bil cataracts  . HERNIA REPAIR     umbilical/ Dr Pat Patrick  . OOPHORECTOMY    . PTCA  August 2012   Right Posterior tibial artery , [place]  . TOE AMPUTATION  Sept 2012   Right 2nd toe, [place]    There were no vitals filed for this visit.  Subjective Assessment - 12/30/17 0923    Subjective  Patient  reports that she woke up today with 7/10 pain and now it is 6/10 so it did not decrease today with actiity .     Pertinent History  Pamela Collier presents for evaluation of  intermittent  left sided leg pain that began without fall or injury several weeks ago.  She state that the pain started  In her left foot,  In the  Toes, and  has ascended up the lateral side of her left leg and is accompanied by stiffness of both legs.  She also describes back pain radiates into left hip and left leg.  No recent fall.       Currently in Pain?  Yes    Pain Score  6     Pain Location  Leg    Pain Orientation  Right;Left    Pain Descriptors / Indicators  Aching        Therapeutic Exercise:   Leg press 75 lbs x 20 x 2 , heel raises 45 lbs 20 x 2   SAQ with 3 lbs and 3 sec hold x 20 x 2  Bridges x 10 x 2  Hooklying abd/ER with GTB x 20   sidelying hip abd x 15 with 3 lbs BLE  TM elevation of 1, 1. 2 miles / hour x 5 mins with fatigue following:  Standing and with RTB Abduction 2 x 10;cues for not leaning towards the opposite side  Standing with RTB and Extension 2 x 10; cues not to lean forward with trunk  Heel raises 2 x 10; cues to not rock forward  Step-ups to 6" step x 10 bilateral; cues for getting entire foot on step  Hamstring stretch/gastroc/soleus stretch x 30 sec x 3 sets BLE   Patient needs cues for correct technique and form                        PT Education - 12/30/17 0924    Education Details  HEP, stretching    Person(s) Educated  Patient    Methods  Explanation;Demonstration    Comprehension  Verbalized understanding;Returned demonstration       PT Short Term Goals - 12/08/17 0946      PT SHORT TERM GOAL #1   Title  Patient will be independent in home exercise program to improve strength/mobility for better functional independence with ADLs.    Time  4    Period  Weeks    Status  New    Target Date  01/05/18      PT SHORT  TERM GOAL #2   Title  Patient will complete five times sit to stand test in < 15 seconds indicating an increased LE strength and improved balance.  Time  4    Period  Weeks    Status  New    Target Date  01/05/18        PT Long Term Goals - 12/08/17 0947      PT LONG TERM GOAL #1   Title  Patient will increase six minute walk test distance to >1000 for progression to community ambulator and improve gait ability    Time  8    Period  Weeks    Status  New    Target Date  02/02/18      PT LONG TERM GOAL #2   Title  Patient will increase 10 meter walk test to >1.23m/s as to improve gait speed for better community ambulation and to reduce fall risk.    Time  8    Period  Weeks    Status  New    Target Date  02/02/18      PT LONG TERM GOAL #3   Title  Patient will reduce timed up and go to <11 seconds to reduce fall risk and demonstrate improved transfer/gait ability.    Time  8    Period  Weeks    Status  New    Target Date  02/02/18      PT LONG TERM GOAL #4   Title  Patient will be require no assist with ascend/descend 3 steps using Least restrictive assistive device.    Time  8    Period  Weeks    Status  New    Target Date  02/02/18            Plan - 12/30/17 0926    Clinical Impression Statement  Pt requires direction and verbal cues for correct performance of stretching exercises. Patient demonstrates weakness in BLE and performs open and closed chain exercises with minimal reports of pain to left knee. Pt was able to perform all exercises with max assist and VC for technique.. Patient struggles with speed during movement as well as balance with unstable surfaces. Pt encouraged continuing new supine HEP .Follow-up as scheduled    Rehab Potential  Good    PT Frequency  2x / week    PT Duration  8 weeks    PT Treatment/Interventions  Aquatic Therapy;Therapeutic activities;Therapeutic exercise;Balance training;Neuromuscular re-education;Patient/family  education;Manual techniques;Dry needling    PT Next Visit Plan  BLE strengthening, balance training    Consulted and Agree with Plan of Care  Patient       Patient will benefit from skilled therapeutic intervention in order to improve the following deficits and impairments:  Abnormal gait, Decreased balance, Decreased mobility, Difficulty walking, Dizziness, Pain, Impaired flexibility, Decreased activity tolerance  Visit Diagnosis: Acute right-sided low back pain with sciatica, sciatica laterality unspecified  Muscle weakness (generalized)  Other abnormalities of gait and mobility     Problem List Patient Active Problem List   Diagnosis Date Noted  . Restless legs 10/17/2017  . Leg pain 10/15/2017  . Normocytic anemia, not due to blood loss 09/27/2017  . Melena 09/25/2017  . Cognitive decline 08/16/2017  . Polypharmacy 07/28/2017  . Pneumonia 05/26/2017  . Pill esophagitis 05/23/2017  . Chronic kidney disease (CKD), stage I 11/15/2016  . History of fall 11/12/2016  . Osteoarthritis 11/10/2016  . Right arm pain 10/15/2016  . Cervical stenosis of spinal canal 10/10/2016  . Cervical radiculopathy at C7 07/02/2016  . Acute midline thoracic back pain 07/02/2016  . Epigastric hernia 04/01/2016  . Umbilical pain 81/44/8185  .  CAD in native artery   . Type 2 DM with CKD stage 3 and hypertension ([place])   . Chronic renal insufficiency   . Hypertensive heart disease   . OSA on CPAP 07/07/2015  . Hospital discharge follow-up 07/07/2015  . Chest pain 06/29/2015  . Pleural effusion, right   . Chronic diastolic CHF (congestive heart failure) ([place]) 04/18/2015  . Diabetic neuropathy ([place]) 02/21/2015  . Benign neoplasm of sigmoid colon   . Low back pain with radiation 04/04/2014  . Essential hypertension 04/04/2014  . Hyperlipidemia 02/23/2014  . Long-term use of high-risk medication 02/23/2014  . Other long term (current) drug therapy 02/23/2014  . Dermatophytic onychia 08/17/2013   . Stable angina ([place]) 08/17/2013  . Cerebral infarct ([place]) 08/17/2013  . Bony exostosis 08/17/2013  . Obesity, unspecified 08/17/2013  . Arthritis 08/05/2013  . Unsteadiness on feet 03/24/2013  . History of colonic polyps 02/20/2013  . Sarcoidosis ([place]) 12/07/2012  . Dysphagia 12/07/2012  . Pulmonary nodule seen on imaging study 05/20/2012  . 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 Diabetic nephropathy associated with type 2 diabetes mellitus ([place]) 02/29/2012  . Cardiomyopathy, ischemic   . Hepatic steatosis   . Osteoporosis, post-menopausal   . Peripheral vascular disease due to secondary diabetes mellitus ([place])   . Double vessel coronary artery disease 12/14/2011    Alanson Puls, PT DPT 12/30/2017, 9:29 AM  [place] MAIN [facility] SERVICES [address] [place], [place], [address] Phone: [phone]   Fax:  [phone]  [name] MRN: [id number] Date of Birth: 08-Jul-1940

## 2018-01-01 ENCOUNTER — Other Ambulatory Visit: Payer: Self-pay | Admitting: *Deleted

## 2018-01-01 NOTE — Patient Outreach (Signed)
[facility]) Care Management  01/01/2018  Pamela Collier 12/16/1940 [id number]   Telephone follow up call    Placed outreach call patient , person answering phone identified self as her husband, stating patient  is not at home.    Plan  Will plan return call in the today will plan return outreach call in the next 4 business days.  Will send unsuccessful outreach letter .   [name], RN, [place] Management Coordinator  [phone]- Mobile [phone]- Toll Free Main Office

## 2018-01-04 ENCOUNTER — Ambulatory Visit: Payer: Medicare Other | Admitting: Physical Therapy

## 2018-01-04 ENCOUNTER — Encounter: Payer: Self-pay | Admitting: Physical Therapy

## 2018-01-04 ENCOUNTER — Encounter: Payer: Medicare Other | Admitting: Physical Therapy

## 2018-01-04 DIAGNOSIS — M5440 Lumbago with sciatica, unspecified side: Secondary | ICD-10-CM

## 2018-01-04 DIAGNOSIS — R2689 Other abnormalities of gait and mobility: Secondary | ICD-10-CM

## 2018-01-04 DIAGNOSIS — M6281 Muscle weakness (generalized): Secondary | ICD-10-CM | POA: Diagnosis not present

## 2018-01-04 IMAGING — CT CT HEAD W/O CM
1 series · 16 of 30 positions shown, 20 images · non-contrast
Comparison: Head CT dated 11/14/2013.

CLINICAL DATA: Numbness throughout whole body starting this
morning. No known injury.

EXAM:
CT HEAD WITHOUT CONTRAST
TECHNIQUE: Contiguous axial images were obtained from the base of the skull
through the vertex without intravenous contrast.

[Series 2: head wo · axial · 0.40mm/px · z∈[-78,+48]mm · 16 of 32 slices shown, 20 images]
[im 2/32  brain]
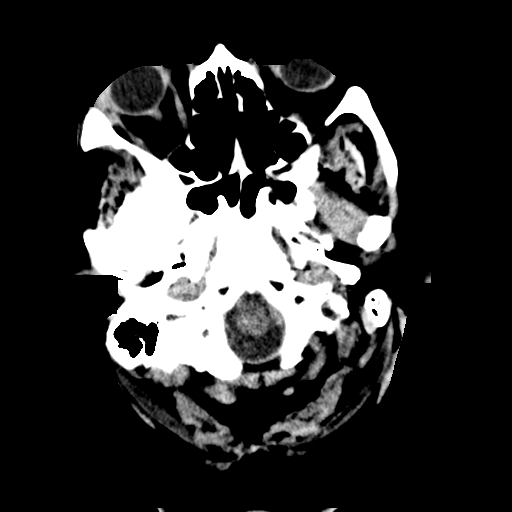
[im 2/32  bone]
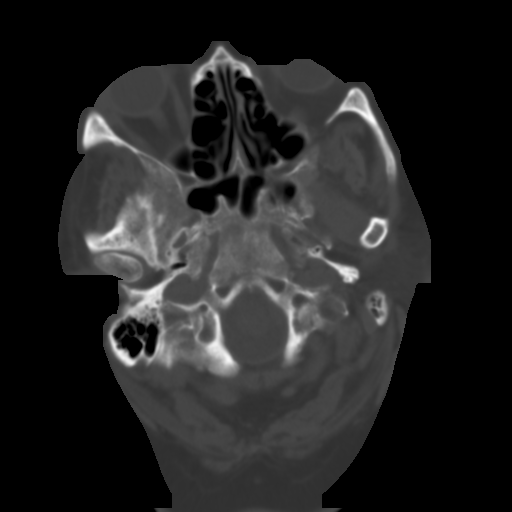
[im 4/32  brain]
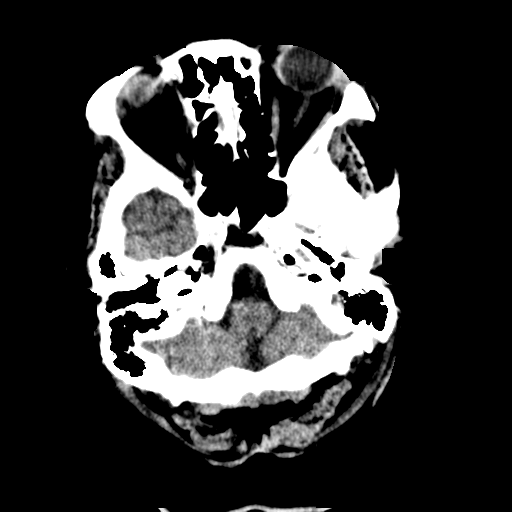
[im 6/32  brain]
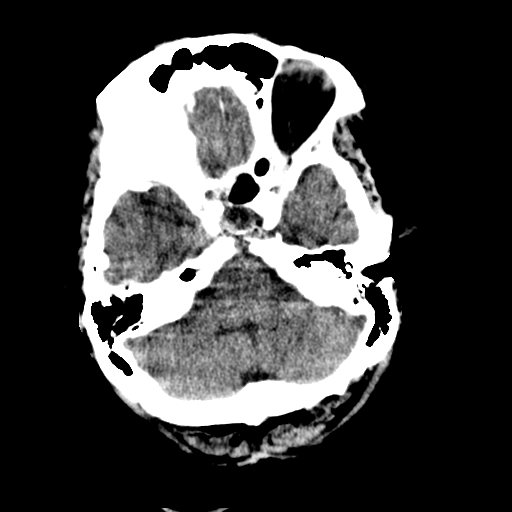
[im 8/32  brain]
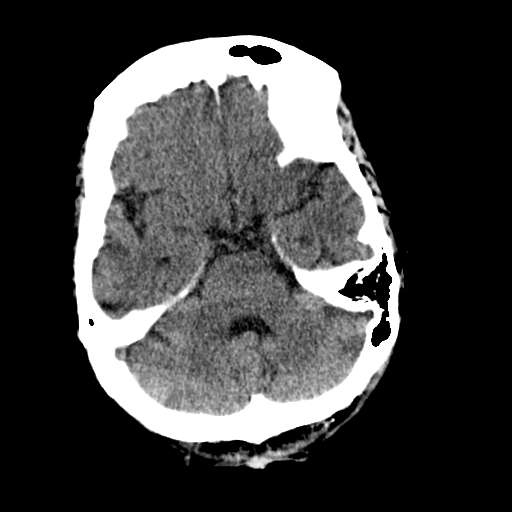
[im 9/32  brain]
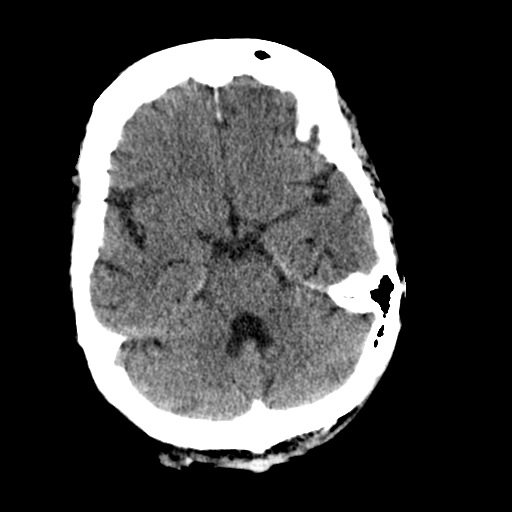
[im 9/32  bone]
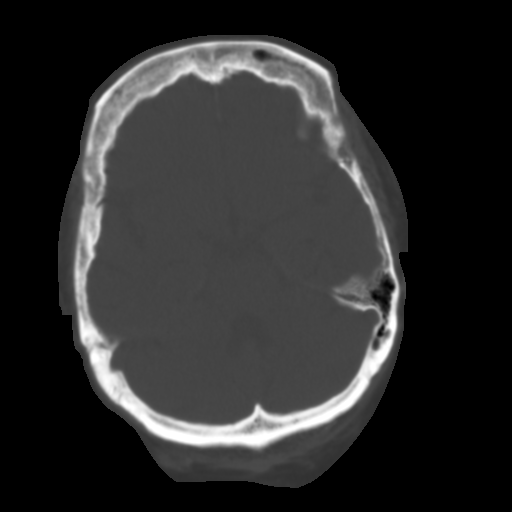
[im 11/32  brain]
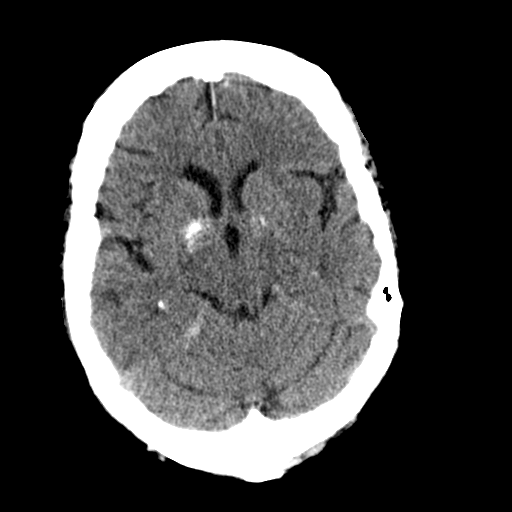
[im 13/32  brain]
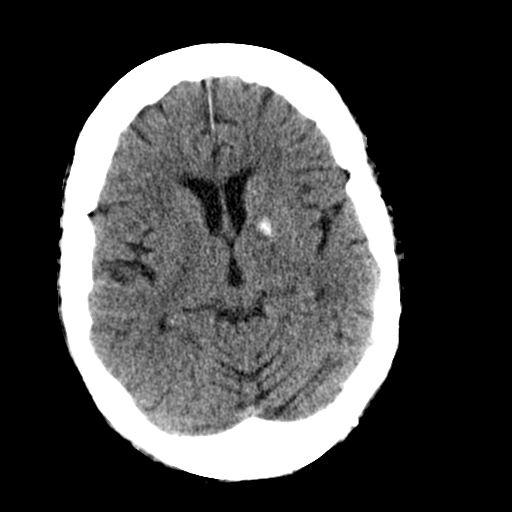
[im 15/32  brain]
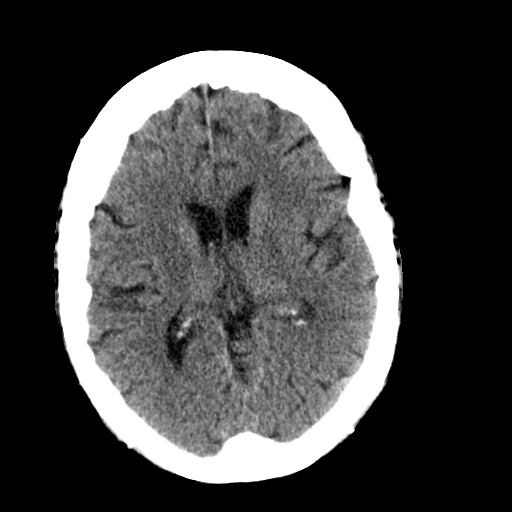
[im 17/32  brain]
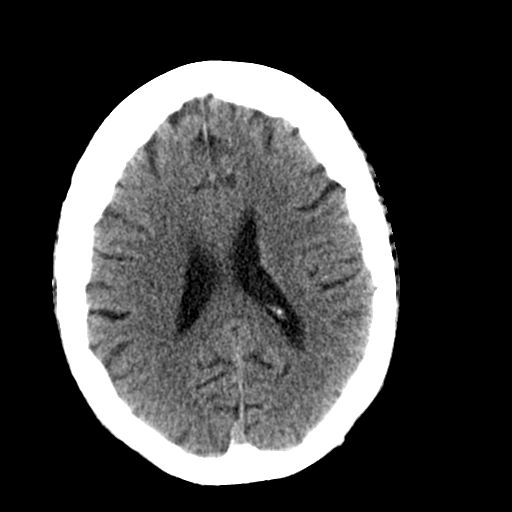
[im 17/32  bone]
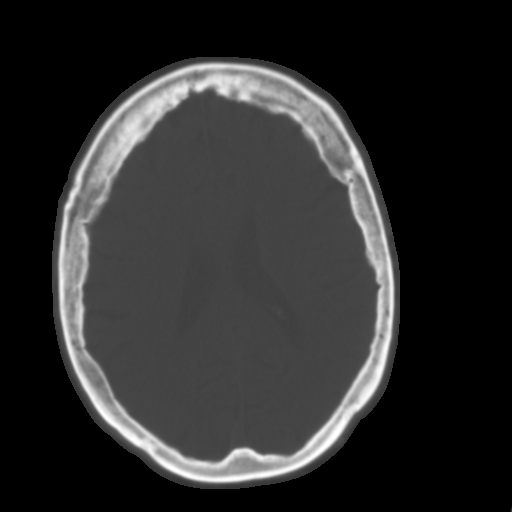
[im 19/32  brain]
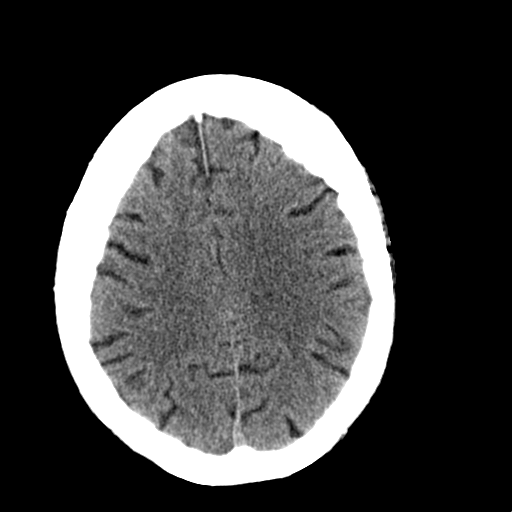
[im 21/32  brain]
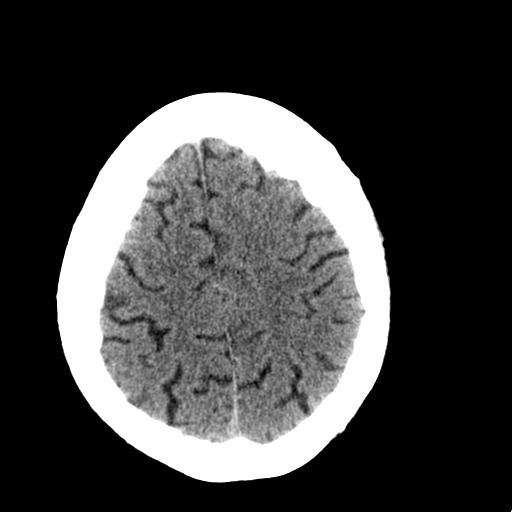
[im 23/32  brain]
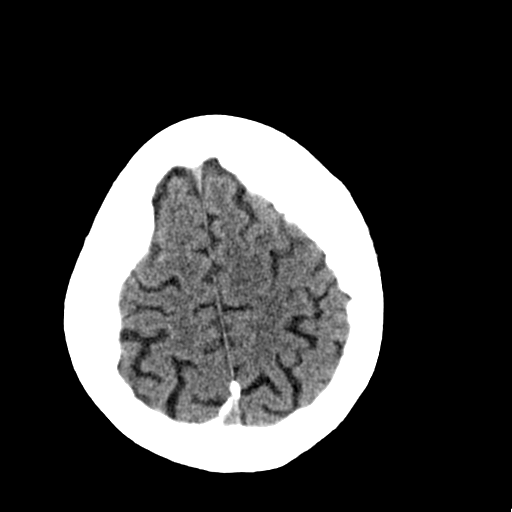
[im 24/32  brain]
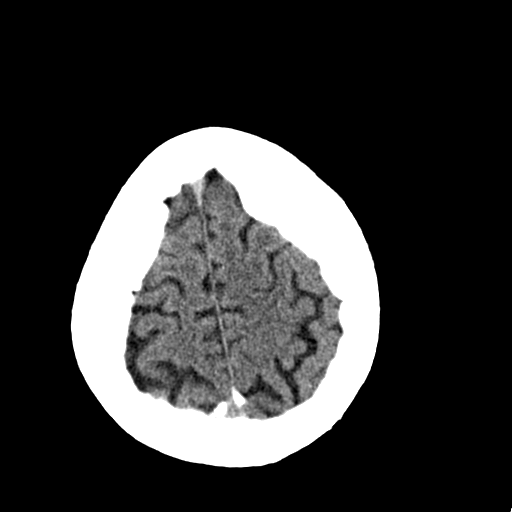
[im 24/32  bone]
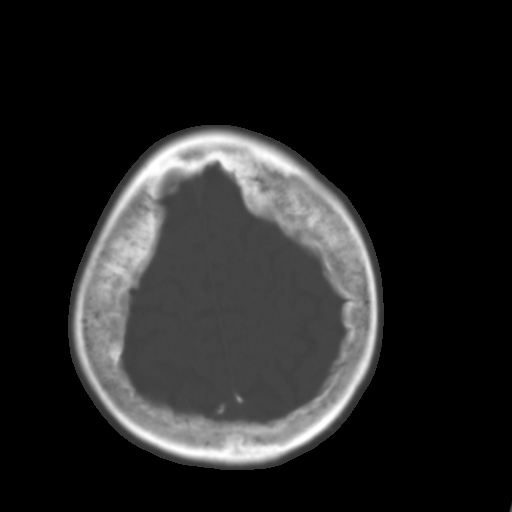
[im 26/32  brain]
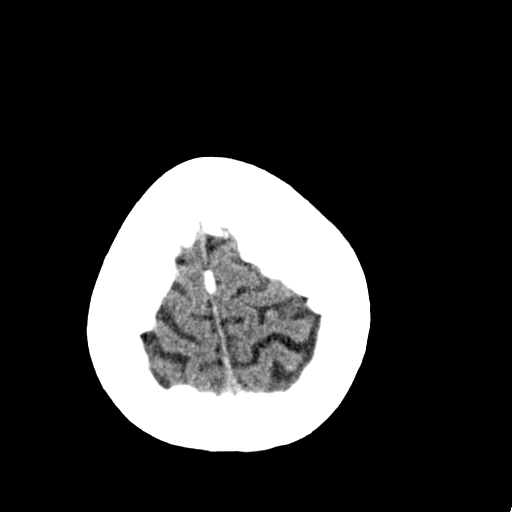
[im 28/32  brain]
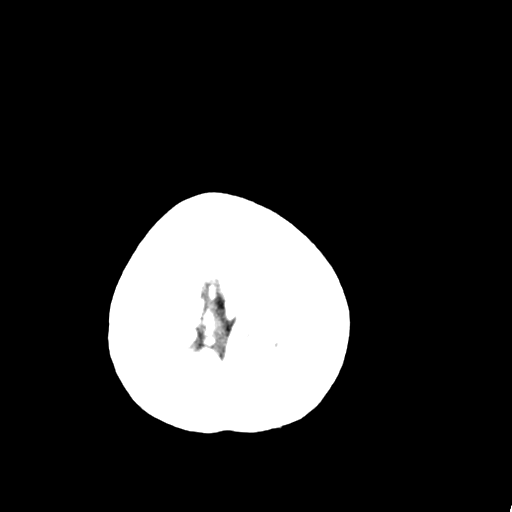
[im 30/32  brain]
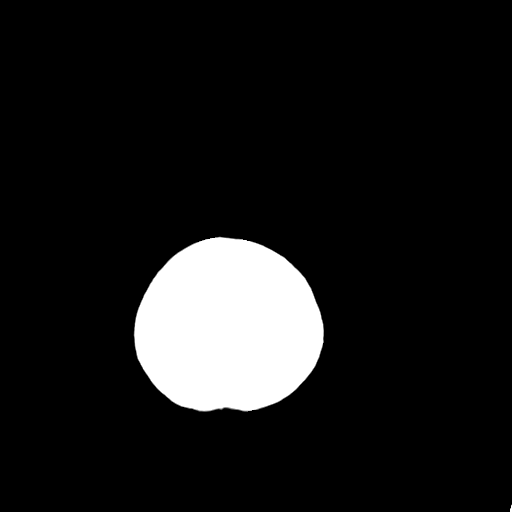

[16 of 30 positions shown; findings below may reference images not displayed]

FINDINGS: Brain: There is mild generalized brain atrophy with commensurate
dilatation of the ventricles and sulci. There is no mass,
hemorrhage, edema, or other evidence of acute parenchymal
abnormality. No extra-axial hemorrhage.

Vascular: No hyperdense vessel or unexpected calcification. There
are chronic calcified atherosclerotic changes of the large vessels
at the skull base.

Skull: Negative for fracture or focal lesion.

Sinuses/Orbits: No acute findings.

Other: None.
IMPRESSION: No acute findings.  No intracranial mass, hemorrhage or edema.

## 2018-01-04 NOTE — Therapy (Signed)
[place] MAIN [facility] SERVICES [address] [place], [place], [address] Phone: [phone]   Fax:  [phone]  Physical Therapy Treatment  Patient Details  [name] MRN: [id number] Date of Birth: [birth year] Referring Provider: [name]    Encounter Date: 01/04/2018  PT End of Session - 01/04/18 1029    Visit Number  8    Number of Visits  17    Date for PT Re-Evaluation  02/02/18    PT Start Time  1021    PT Stop Time  1100    PT Time Calculation (min)  39 min    Equipment Utilized During Treatment  Gait belt    Activity Tolerance  Patient tolerated treatment well    Behavior During Therapy  [facility] for tasks assessed/performed       Past Medical History:  Diagnosis Date  . Acute respiratory failure ([place]) 11/21/2014  . Anxiety   . Arthritis 08/05/2013  . Atherosclerotic peripheral vascular disease with gangrene [facility]) august 2012  . Cardiomyopathy, ischemic    a. EF 35 to 40% by echo in 2013 b. EF improved to 50-55% by echo in 04/2015; 11/2016 Echo: EF 55-60%, Gr1 DD, mildly dil LA, nl RV fxn.  . Cerebral infarct ([place]) 08/17/2013  . Chronic diastolic CHF (congestive heart failure) (HCC)    a. EF 50-55% by echo in 04/2015; b. 11/2016 Echo: EF 55-60%, Gr1 DD.  [name] COPD (chronic obstructive pulmonary disease) ([place])   . Coronary artery disease, occlusive    a. Previous PCI to the LAD, LCx, and RCA in 2010, 2011, 2013, and 2016. All performed in [place]; b. 11/2016 Lexiscan MV: EF 43%, no ischemia->low risk.  . 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 Diabetic diarrhea ([place]) 10/03/2014  . DM type 2, uncontrolled, with renal complications ([place]) 10/18/4852  . Hepatic steatosis    by CT abd pelvis  . History of kidney stones    at a younger age  . Hyperlipidemia LDL goal <100 02/23/2014  . Hypertensive heart disease   . Osteoporosis, post-menopausal   . Peripheral vascular disease due to secondary diabetes mellitus [facility]) July 2011   s/p  right 2nd toe amputation for gangrene  . Pill esophagitis   . Pleural effusion 10/25/2012   10/2012 CT chest >> small to moderate R lung effusion>> chylothorax, 100% lymphs 10/2013 thoracentesis> cytology negative, WBC 1471, > 90% "small lymphs" 01/2014 CT chest> near complete resolution of pleural effusion, stable lymphadenopathy 08/2014 CT chest [place] ([facility]): small right sided effusion decreased in size, stable mediastinal lymphadenopathy 1.0cm largest, 71m  . 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 Sleep apnea   . Stage III chronic kidney disease [facility])     Past Surgical History:  Procedure Laterality Date  . ABDOMINAL HYSTERECTOMY     at ge 39. secondary to bleeding/partial  . ANTERIOR CERVICAL DECOMP/DISCECTOMY FUSION N/A 10/10/2016   Procedure: Anterior Cervical Discectomy Fusion - Cervical four4- five - Cervical five-Cervical six - Cervical six-Cervical seven;  Surgeon: Earnie Larsson, MD;  Location: [place];  Service: Neurosurgery;  Laterality: N/A;  . BREAST CYST ASPIRATION Right   . CARPAL TUNNEL RELEASE Bilateral   . CHOLECYSTECTOMY  in [place]   . COLONOSCOPY WITH PROPOFOL N/A 01/09/2015   Procedure: COLONOSCOPY WITH PROPOFOL;  Surgeon: Lucilla Lame, MD;  Location: [facility] ENDOSCOPY;  Service: Endoscopy;  Laterality: N/A;  . CORONARY ANGIOPLASTY WITH STENT PLACEMENT     [place]; [facility]  . EYE SURGERY     bil cataracts  . HERNIA REPAIR     umbilical/ Dr Pat Patrick  . OOPHORECTOMY    . PTCA  August 2012   Right Posterior tibial artery , [place]  . TOE AMPUTATION  Sept 2012   Right 2nd toe, [place]    There were no vitals filed for this visit.  Subjective Assessment - 01/04/18 1028    Subjective  Patient  reports that she woke up today with 0/10 pain.    Pertinent History  Pamela Collier presents for evaluation of  intermittent  left sided leg pain that began without fall or injury several weeks ago.  She state that the pain started  In her left foot,  In the  Toes, and  has ascended up the lateral side of her left leg and is accompanied by stiffness of both legs.  She also describes back pain radiates into left hip and left leg.  No recent fall.       Currently in Pain?  No/denies    Pain Score  0-No pain       Therapeutic Exercise:  Leg press 75 lbs x 20 x 2 , heel raises 45 lbs 20 x 2   SAQ with 3 lbs and 3 sec hold x 20 x 2  Bridges x 10 x 2  Hooklying abd/ER with GTB x 20   sidelying hip abd x 15 with 3 lbs BLE  Heel slides with 3 lbs x 10 x 2 BLE:  Standing and with RTBAbduction 2 x 10;cues for not leaning towards the opposite side  Standing with RTB andExtension 2 x 10; cues not to lean forward with trunk  Heel raises 2 x 10; cues to not rock forward  Lunges to bosu ball x 10 BLE  Hamstring stretch/gastroc/soleus stretch x 30 sec x 3 sets BLE   Patient needs cues for correct technique and form                         PT Education - 01/04/18 1029    Education Details  HEP    Person(s) Educated  Patient    Methods  Explanation;Demonstration;Verbal cues    Comprehension  Verbalized understanding;Returned demonstration;Verbal cues required       PT Short Term Goals - 12/08/17 0946      PT SHORT TERM GOAL #1   Title  Patient will be independent in home exercise program to improve strength/mobility for better functional independence with ADLs.    Time  4    Period  Weeks    Status  New    Target Date  01/05/18      PT SHORT TERM GOAL #2   Title  Patient will complete five times sit to stand test in < 15 seconds indicating an increased LE strength and improved balance.    Time  4    Period  Weeks    Status  New    Target  Date  01/05/18        PT Long Term Goals - 12/08/17 0947      PT LONG TERM GOAL #1   Title  Patient will increase six minute walk test distance to >1000 for progression to community ambulator and improve gait ability    Time  8    Period  Weeks    Status  New    Target Date  02/02/18      PT LONG TERM GOAL #2   Title  Patient will increase 10 meter walk test to >1.45m/s as to improve gait speed for better community ambulation and to reduce fall risk.    Time  8    Period  Weeks    Status  New    Target Date  02/02/18      PT LONG TERM GOAL #3   Title  Patient will reduce timed up and go to <11 seconds to reduce fall risk and demonstrate improved transfer/gait ability.    Time  8    Period  Weeks    Status  New    Target Date  02/02/18      PT LONG TERM GOAL #4   Title  Patient will be require no assist with ascend/descend 3 steps using Least restrictive assistive device.    Time  8    Period  Weeks    Status  New    Target Date  02/02/18            Plan - 01/04/18 1030    Clinical Impression Statement  Pt demonstrates excellent motivation during session and does not appear overly fatigued with exercise. She does require frequent redirection during exercise. No increase in pain reported during session. Patient demonstrates weakness in BLE during exercises however she is able to perform closed chain and open chain exercises today with no rest periods.  Pt encouraged to continue HEP and follow-up as scheduled. Pt will benefit from PT services to address deficits in strength and pain in order to return to full function at home    Rehab Potential  Good    PT Frequency  2x / week    PT Duration  8 weeks    PT Treatment/Interventions  Aquatic Therapy;Therapeutic activities;Therapeutic exercise;Balance training;Neuromuscular re-education;Patient/family education;Manual techniques;Dry needling    PT Next Visit Plan  BLE strengthening, balance training    Consulted and Agree  with Plan of Care  Patient       Patient will benefit from skilled therapeutic intervention in order to improve the following deficits and impairments:  Abnormal gait, Decreased balance, Decreased mobility, Difficulty walking, Dizziness, Pain, Impaired flexibility, Decreased activity tolerance  Visit Diagnosis: Acute right-sided low back pain with sciatica, sciatica laterality unspecified  Muscle weakness (generalized)  Other abnormalities of gait and mobility     Problem List Patient Active Problem List   Diagnosis Date Noted  . Restless legs 10/17/2017  . Leg pain 10/15/2017  . Normocytic anemia, not due to blood loss 09/27/2017  . Melena 09/25/2017  . Cognitive decline 08/16/2017  . Polypharmacy 07/28/2017  . Pneumonia 05/26/2017  . Pill esophagitis 05/23/2017  . Chronic kidney disease (CKD), stage I 11/15/2016  . History of fall 11/12/2016  . Osteoarthritis 11/10/2016  . Right arm pain 10/15/2016  . Cervical stenosis of spinal canal 10/10/2016  . Cervical radiculopathy at C7 07/02/2016  . Acute midline thoracic back pain 07/02/2016  . Epigastric hernia 04/01/2016  . Umbilical pain 41/66/0630  . CAD in native artery   . Type 2 DM with CKD stage 3 and hypertension ([place])   . Chronic renal insufficiency   . Hypertensive heart disease   . OSA  on CPAP 07/07/2015  . Hospital discharge follow-up 07/07/2015  . Chest pain 06/29/2015  . Pleural effusion, right   . Chronic diastolic CHF (congestive heart failure) ([place]) 04/18/2015  . Diabetic neuropathy ([place]) 02/21/2015  . Benign neoplasm of sigmoid colon   . Low back pain with radiation 04/04/2014  . Essential hypertension 04/04/2014  . Hyperlipidemia 02/23/2014  . Long-term use of high-risk medication 02/23/2014  . Other long term (current) drug therapy 02/23/2014  . Dermatophytic onychia 08/17/2013  . Stable angina ([place]) 08/17/2013  . Cerebral infarct ([place]) 08/17/2013  . Bony exostosis 08/17/2013  . Obesity,  unspecified 08/17/2013  . Arthritis 08/05/2013  . Unsteadiness on feet 03/24/2013  . History of colonic polyps 02/20/2013  . Sarcoidosis ([place]) 12/07/2012  . Dysphagia 12/07/2012  . Pulmonary nodule seen on imaging study 05/20/2012  . 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 Diabetic nephropathy associated with type 2 diabetes mellitus ([place]) 02/29/2012  . Cardiomyopathy, ischemic   . Hepatic steatosis   . Osteoporosis, post-menopausal   . Peripheral vascular disease due to secondary diabetes mellitus ([place])   . Double vessel coronary artery disease 12/14/2011    Alanson Puls, PT DPT 01/04/2018, 10:34 AM  [place] MAIN [facility] SERVICES [address] [place], [place], [address] Phone: [phone]   Fax:  [phone]  [name] MRN: [id number] Date of Birth: 07-07-1940

## 2018-01-05 ENCOUNTER — Telehealth: Payer: Self-pay | Admitting: Internal Medicine

## 2018-01-05 NOTE — Telephone Encounter (Signed)
Can I schedule patient is a same day for tomorrow 01-06-18.

## 2018-01-05 NOTE — Telephone Encounter (Unsigned)
Copied from [place]. Topic: Appointment Scheduling - Scheduling Inquiry for Clinic >> Jan 05, 2018 10:58 AM Sheran Luz wrote: Reason for CRM: Pt states that after physical therapy she is experiencing pain in her legs making it hard to walk. Pt is trying to make an appointment to be seen with Dr. Derrel Nip but does not think she can wait until her next open appointment. Pt declined an appointment with another provider. Pt is requesting a call back and to be fit into the schedule if possible.   [phone]

## 2018-01-06 ENCOUNTER — Encounter: Payer: Self-pay | Admitting: Physical Therapy

## 2018-01-06 ENCOUNTER — Other Ambulatory Visit: Payer: Self-pay | Admitting: *Deleted

## 2018-01-06 ENCOUNTER — Encounter: Payer: Medicare Other | Admitting: Physical Therapy

## 2018-01-06 ENCOUNTER — Ambulatory Visit: Payer: Medicare Other | Admitting: Physical Therapy

## 2018-01-06 DIAGNOSIS — M6281 Muscle weakness (generalized): Secondary | ICD-10-CM

## 2018-01-06 DIAGNOSIS — R2689 Other abnormalities of gait and mobility: Secondary | ICD-10-CM

## 2018-01-06 DIAGNOSIS — M5440 Lumbago with sciatica, unspecified side: Secondary | ICD-10-CM

## 2018-01-06 NOTE — Patient Outreach (Signed)
[name]) Care Management  01/06/2018  Pamela Collier 1940/10/15 [id number]   Telephone call attempt #2  Unsuccessful outreach call to patient no answer, phone just rang , no answering machine pickup, unable to leave a message.    Plan  Will await return call  If no response on today, will plan 3rd call attempt on day #7 , 9/3 .    [name], RN, [place] Management Coordinator  [phone]- Mobile [phone]- Toll Free Main Office .

## 2018-01-06 NOTE — Therapy (Signed)
[place] MAIN [facility] SERVICES [address] [place], [place], [address] Phone: [phone]   Fax:  [phone]  Physical Therapy Treatment Physical Therapy Progress Note   Dates of reporting period 12/08/17   to   01/06/18  Patient Details  [name] MRN: [id number] Date of Birth: 08-Oct-1940 Referring Provider: [name]    Encounter Date: 01/06/2018  PT End of Session - 01/06/18 1107    Visit Number  9    Number of Visits  17    Date for PT Re-Evaluation  02/02/18    PT Start Time  1100    PT Stop Time  1145    PT Time Calculation (min)  45 min    Equipment Utilized During Treatment  Gait belt    Activity Tolerance  Patient tolerated treatment well    Behavior During Therapy  WFL for tasks assessed/performed       Past Medical History:  Diagnosis Date  . Acute respiratory failure ([place]) 11/21/2014  . Anxiety   . Arthritis 08/05/2013  . Atherosclerotic peripheral vascular disease with gangrene [facility]) august 2012  . Cardiomyopathy, ischemic    a. EF 35 to 40% by echo in 2013 b. EF improved to 50-55% by echo in 04/2015; 11/2016 Echo: EF 55-60%, Gr1 DD, mildly dil LA, nl RV fxn.  . Cerebral infarct ([place]) 08/17/2013  . Chronic diastolic CHF (congestive heart failure) (HCC)    a. EF 50-55% by echo in 04/2015; b. 11/2016 Echo: EF 55-60%, Gr1 DD.  [name] COPD (chronic obstructive pulmonary disease) ([place])   . Coronary artery disease, occlusive    a. Previous PCI to the LAD, LCx, and RCA in 2010, 2011, 2013, and 2016. All performed in [place]; b. 11/2016 Lexiscan MV: EF 43%, no ischemia->low risk.  . 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 Diabetic diarrhea ([place]) 10/03/2014  . DM type 2, uncontrolled, with renal complications ([place]) 07/15/4654  . Hepatic steatosis    by CT abd pelvis  . History of kidney stones    at a younger age  . Hyperlipidemia LDL goal <100 02/23/2014  . Hypertensive heart disease   . Osteoporosis, post-menopausal   .  Peripheral vascular disease due to secondary diabetes mellitus 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 Pill esophagitis   . Pleural effusion 10/25/2012   10/2012 CT chest >> small to moderate R lung effusion>> chylothorax, 100% lymphs 10/2013 thoracentesis> cytology negative, WBC 1471, > 90% "small lymphs" 01/2014 CT chest> near complete resolution of pleural effusion, stable lymphadenopathy 08/2014 CT chest [place] ([facility]): small right sided effusion decreased in size, stable mediastinal lymphadenopathy 1.0cm largest, 58m  . 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 Sleep apnea   . Stage III chronic kidney disease [facility])     Past Surgical History:  Procedure Laterality Date  . ABDOMINAL HYSTERECTOMY     at ge 39. secondary to bleeding/partial  . ANTERIOR CERVICAL DECOMP/DISCECTOMY FUSION N/A 10/10/2016   Procedure: Anterior Cervical Discectomy Fusion - Cervical four4- five - Cervical five-Cervical six - Cervical six-Cervical seven;  Surgeon: Earnie Larsson, MD;  Location: [place];  Service: Neurosurgery;  Laterality: N/A;  .  BREAST CYST ASPIRATION Right   . CARPAL TUNNEL RELEASE Bilateral   . CHOLECYSTECTOMY     in [place]   . COLONOSCOPY WITH PROPOFOL N/A 01/09/2015   Procedure: COLONOSCOPY WITH PROPOFOL;  Surgeon: Lucilla Lame, MD;  Location: [facility] ENDOSCOPY;  Service: Endoscopy;  Laterality: N/A;  . CORONARY ANGIOPLASTY WITH STENT PLACEMENT     [place]; [facility]  . EYE SURGERY     bil cataracts  . HERNIA REPAIR     umbilical/ Dr Pat Patrick  . OOPHORECTOMY    . PTCA  August 2012   Right Posterior tibial artery , [place]  . TOE AMPUTATION  Sept 2012   Right 2nd toe, [place]    There were no vitals filed  for this visit.  Subjective Assessment - 01/06/18 1105    Subjective  Patient reports that she woke up today with 6/10 pain.    Pertinent History  [name] presents for evaluation of  intermittent  left sided leg pain that began without fall or injury several weeks ago.  She state that the pain started  In her left foot,  In the  Toes, and  has ascended up the lateral side of her left leg and is accompanied by stiffness of both legs.  She also describes back pain radiates into left hip and left leg.  No recent fall.       Currently in Pain?  Yes    Pain Location  Leg    Pain Orientation  Right;Left    Pain Descriptors / Indicators  Aching    Pain Frequency  Intermittent    Aggravating Factors   waling    Pain Relieving Factors  heat    Effect of Pain on Daily Activities  difficult to walk        Therapeutic Exercise:  Marching in hooklying x 20   SAQ  and 3 sec hold x 20 x 2  Bridges x 10 x 2  Hooklying abd/ER with GTB x 20   sidelying hip abd x 15  BLE   Patient needs cues for correct technique and form  Therapeutic activities:            OUTCOME MEASURES:eval results  TEST Outcome Interpretation  5 times sit<>stand 17.5.29sec >46 yo, >15 sec indicates increased risk for falls  10 meter walk test      .85           m/s <1.0 m/s indicates increased risk for falls; limited community ambulator  Timed up and Go   17.68              sec <14 sec indicates increased risk for falls  6 minute walk test    850            Feet 1000 feet is community ambulator             OUTCOME MEASURES:01/06/18 TEST Outcome Interpretation  5 times sit<>stand 16.5.73sec >35 yo, >15 sec indicates increased risk for falls  10 meter walk test      .88           m/s <1.0 m/s indicates increased risk for falls; limited community ambulator  Timed up and Go   15.68              sec <14 sec indicates increased risk for falls  6 minute walk test    875  Feet 1000 feet  is community ambulator                     PT Education - 01/06/18 1106    Education Details  HEP    Person(s) Educated  Patient    Methods  Explanation;Demonstration    Comprehension  Verbalized understanding;Returned demonstration       PT Short Term Goals - 01/06/18 1114      PT SHORT TERM GOAL #1   Title  Patient will be independent in home exercise program to improve strength/mobility for better functional independence with ADLs.    Time  4    Period  Weeks    Status  On-going    Target Date  01/06/18      PT SHORT TERM GOAL #2   Title  Patient will complete five times sit to stand test in < 15 seconds indicating an increased LE strength and improved balance.    Baseline  01/06/18 =18.40    Time  4    Period  Weeks    Status  On-going    Target Date  01/06/18        PT Long Term Goals - 01/06/18 1111      PT LONG TERM GOAL #1   Title  Patient will increase six minute walk test distance to >1000 for progression to community ambulator and improve gait ability    Baseline  01/06/18=600    Time  8    Period  Weeks    Status  New    Target Date  02/02/18      PT LONG TERM GOAL #2   Title  Patient will increase 10 meter walk test to >1.17m/s as to improve gait speed for better community ambulation and to reduce fall risk.    Baseline  01/06/18  .54 m/sec    Time  8    Period  Weeks    Status  On-going    Target Date  02/02/18      PT LONG TERM GOAL #3   Title  Patient will reduce timed up and go to <11 seconds to reduce fall risk and demonstrate improved transfer/gait ability.    Baseline  01/06/18=16.82 sec    Time  8    Period  Weeks    Status  New    Target Date  02/02/18      PT LONG TERM GOAL #4   Title  Patient will be require no assist with ascend/descend 3 steps using Least restrictive assistive device.    Baseline  01/06/18 she needs the spc for steps    Time  8    Period  Weeks    Status  On-going            Plan - 01/06/18  1108    Clinical Impression Statement Patient's condition has the potential to improve in response to therapy. Maximum improvement is yet to be obtained. The anticipated improvement is attainable and reasonable in a generally predictable time.  Patient reports that her legs still hurt but she feels better after therapy.  Instructed patient in strengthening exercise. Patient requires CGA to min A with advanced strengthening exercise. Patient requires cues for weight shift and trunk control for better balance. Patient also instructed to slow down LE movement during strengthening exercise for better motor control. Patient reports increased fatigue at end of treatment session. Patient would benefit from additional skilled PT intervention to improve balance/gait safety and  reduce fall risk.    Rehab Potential  Good    PT Frequency  2x / week    PT Duration  8 weeks    PT Treatment/Interventions  Aquatic Therapy;Therapeutic activities;Therapeutic exercise;Balance training;Neuromuscular re-education;Patient/family education;Manual techniques;Dry needling    PT Next Visit Plan  BLE strengthening, balance training    Consulted and Agree with Plan of Care  Patient       Patient will benefit from skilled therapeutic intervention in order to improve the following deficits and impairments:  Abnormal gait, Decreased balance, Decreased mobility, Difficulty walking, Dizziness, Pain, Impaired flexibility, Decreased activity tolerance  Visit Diagnosis: Acute right-sided low back pain with sciatica, sciatica laterality unspecified  Muscle weakness (generalized)  Other abnormalities of gait and mobility     Problem List Patient Active Problem List   Diagnosis Date Noted  . Restless legs 10/17/2017  . Leg pain 10/15/2017  . Normocytic anemia, not due to blood loss 09/27/2017  . Melena 09/25/2017  . Cognitive decline 08/16/2017  . Polypharmacy 07/28/2017  . Pneumonia 05/26/2017  . Pill esophagitis  05/23/2017  . Chronic kidney disease (CKD), stage I 11/15/2016  . History of fall 11/12/2016  . Osteoarthritis 11/10/2016  . Right arm pain 10/15/2016  . Cervical stenosis of spinal canal 10/10/2016  . Cervical radiculopathy at C7 07/02/2016  . Acute midline thoracic back pain 07/02/2016  . Epigastric hernia 04/01/2016  . Umbilical pain 41/96/2229  . CAD in native artery   . Type 2 DM with CKD stage 3 and hypertension ([place])   . Chronic renal insufficiency   . Hypertensive heart disease   . OSA on CPAP 07/07/2015  . Hospital discharge follow-up 07/07/2015  . Chest pain 06/29/2015  . Pleural effusion, right   . Chronic diastolic CHF (congestive heart failure) ([place]) 04/18/2015  . Diabetic neuropathy ([place]) 02/21/2015  . Benign neoplasm of sigmoid colon   . Low back pain with radiation 04/04/2014  . Essential hypertension 04/04/2014  . Hyperlipidemia 02/23/2014  . Long-term use of high-risk medication 02/23/2014  . Other long term (current) drug therapy 02/23/2014  . Dermatophytic onychia 08/17/2013  . Stable angina ([place]) 08/17/2013  . Cerebral infarct ([place]) 08/17/2013  . Bony exostosis 08/17/2013  . Obesity, unspecified 08/17/2013  . Arthritis 08/05/2013  . Unsteadiness on feet 03/24/2013  . History of colonic polyps 02/20/2013  . Sarcoidosis ([place]) 12/07/2012  . Dysphagia 12/07/2012  . Pulmonary nodule seen on imaging study 05/20/2012  . 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 Diabetic nephropathy associated with type 2 diabetes mellitus ([place]) 02/29/2012  . Cardiomyopathy, ischemic   . Hepatic steatosis   . Osteoporosis, post-menopausal   . Peripheral vascular disease due to secondary diabetes mellitus ([place])   . Double vessel coronary artery disease 12/14/2011    [place] Puls, PT DPT 01/06/2018, 11:29 AM  [place] MAIN [facility] SERVICES [address] [place], [place], [address] Phone: [phone]   Fax:  [phone]  Name:  JISELLA ASHENFELTER MRN: [id number] Date of Birth: [birth year]

## 2018-01-06 NOTE — Telephone Encounter (Signed)
LMTCB. Please transfer pt to our office.  

## 2018-01-07 NOTE — Telephone Encounter (Signed)
LMTCB. Please transfer pt to our office.  

## 2018-01-08 IMAGING — US US ABDOMEN COMPLETE
1 series · 14 of 25 positions shown · non-contrast
Comparison: CT abdomen pelvis 10/19/2015.

CLINICAL DATA: Patient with abdominal distention and weight gain.
Prior cholecystectomy.

EXAM:
ABDOMEN ULTRASOUND COMPLETE

[Series 1: us abdomen complete · 0.23mm/px · 14 of 85 slices shown]
[im 1/85]
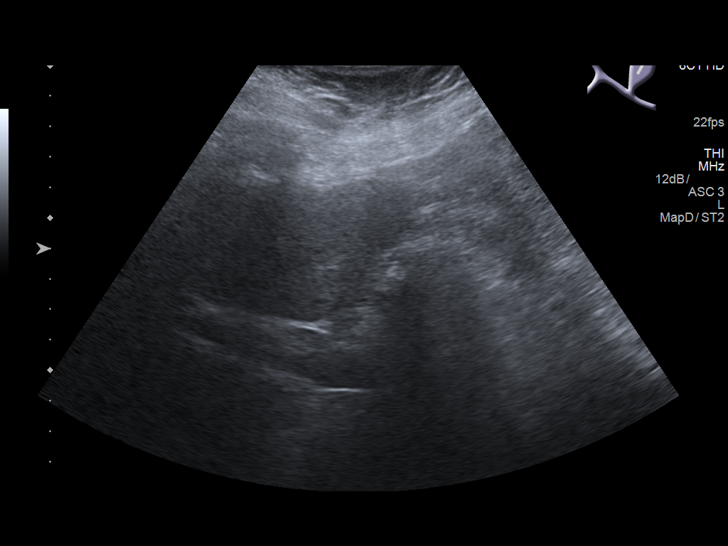
[im 8/85]
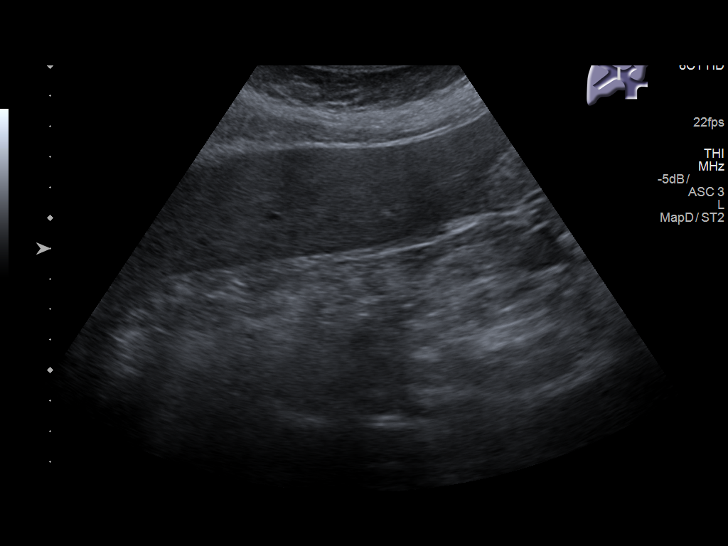
[im 15/85]
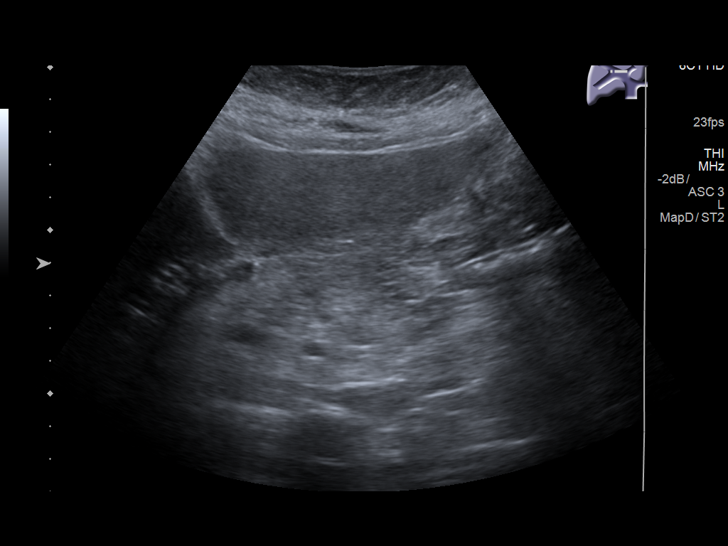
[im 22/85]
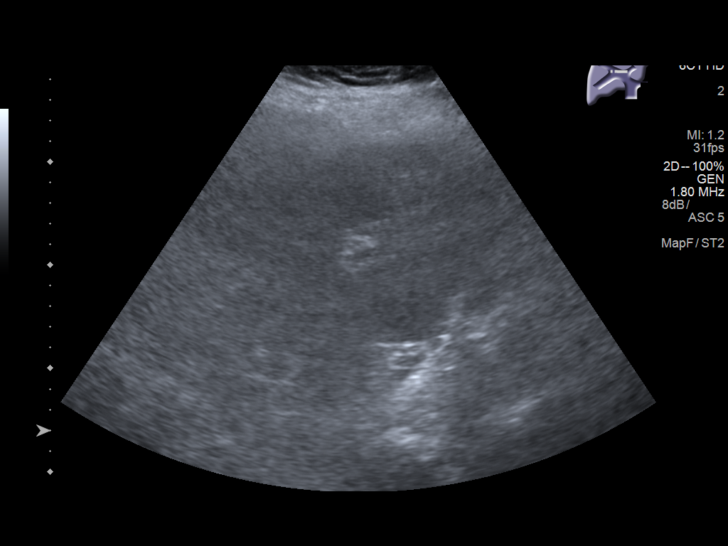
[im 29/85]
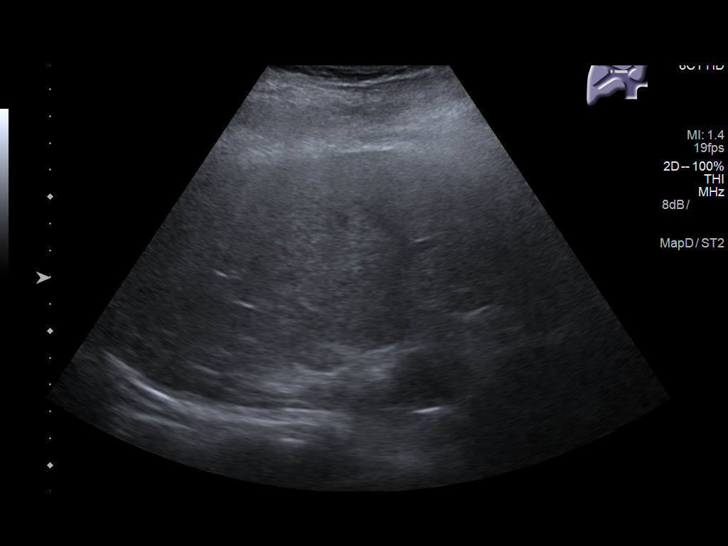
[im 32/85]
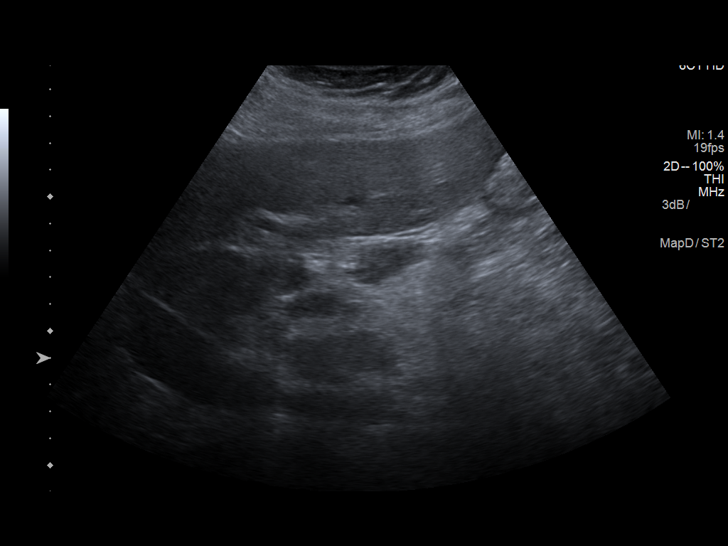
[im 39/85]
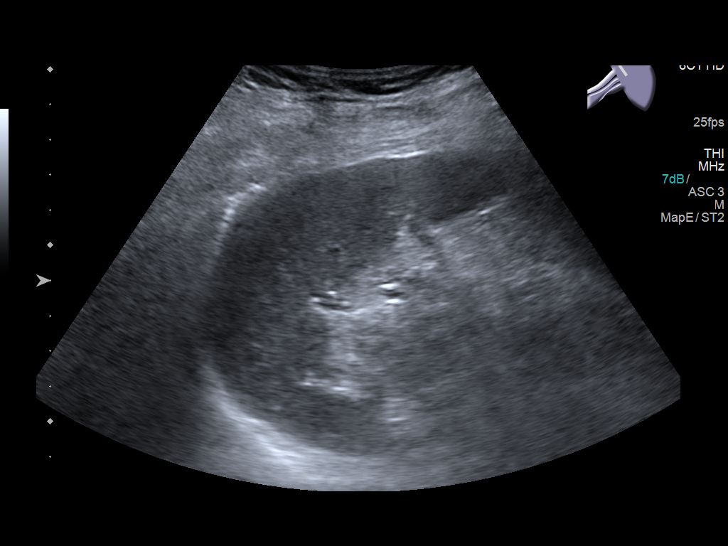
[im 46/85]
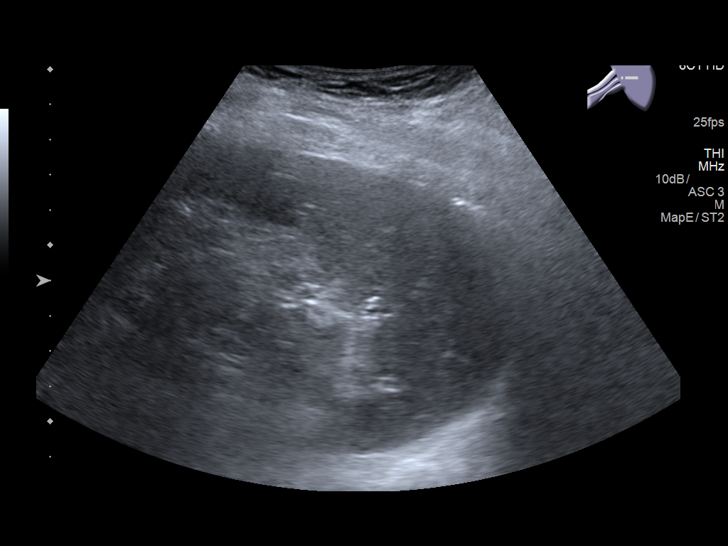
[im 53/85]
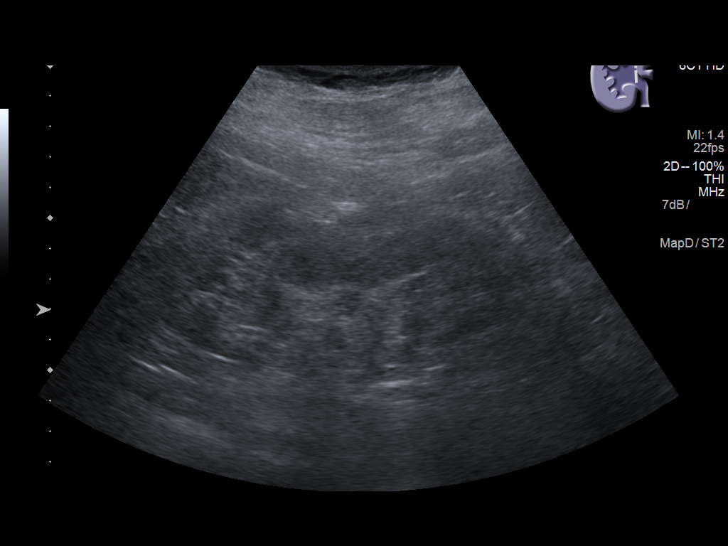
[im 57/85]
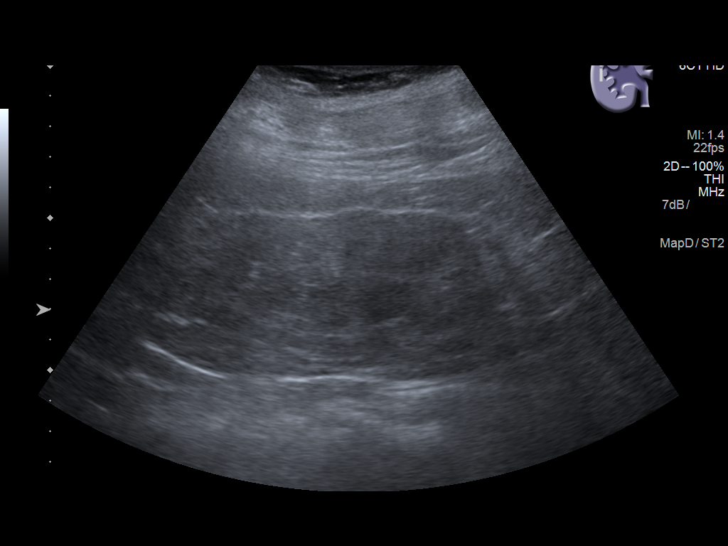
[im 64/85]
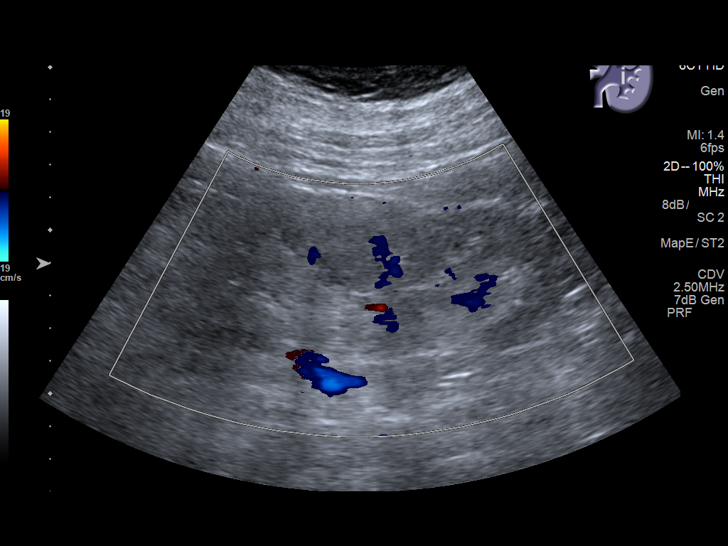
[im 71/85]
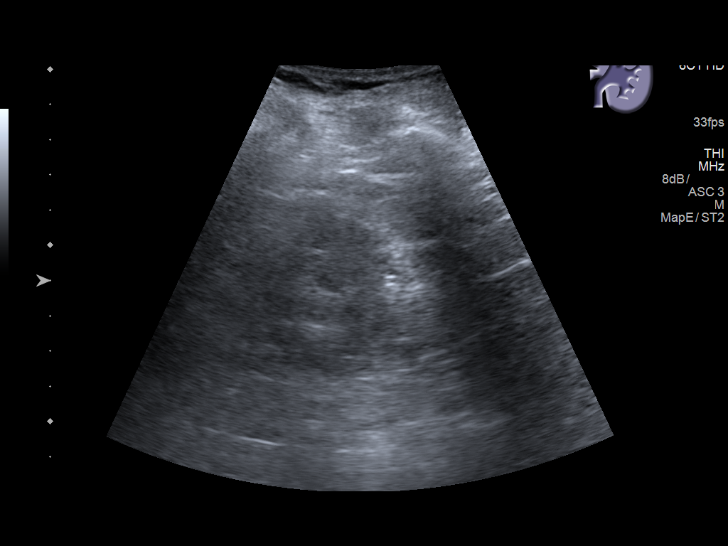
[im 78/85]
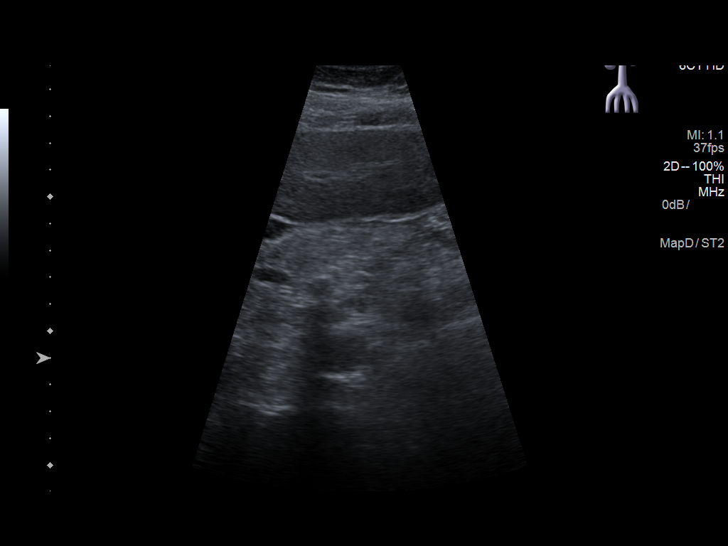
[im 85/85]
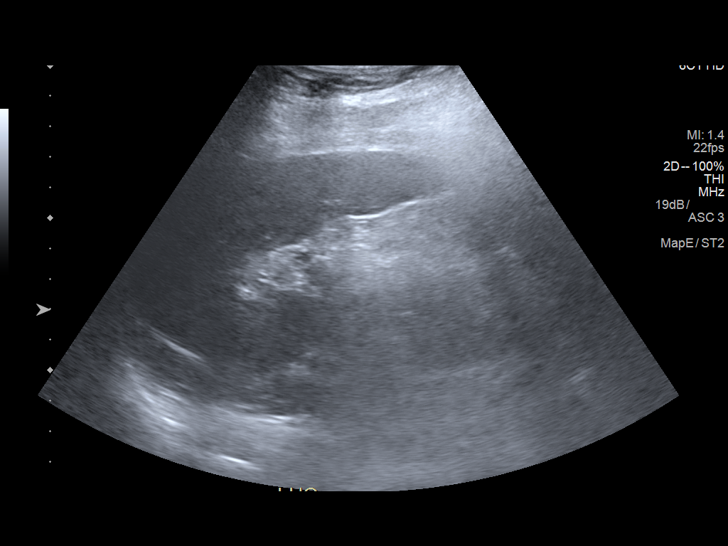

[14 of 25 positions shown; findings below may reference images not displayed]

FINDINGS: Gallbladder: Surgically absent

Common bile duct: Diameter: 5 mm

Liver: No focal lesion identified. Increased echogenicity. Portal
vein is patent on color Doppler imaging with normal direction of
blood flow towards the liver.

IVC: No abnormality visualized.

Pancreas: Visualized portion unremarkable.

Spleen: Size and appearance within normal limits.

Right Kidney: Length: 12.4 cm. Echogenicity within normal limits. No
mass or hydronephrosis visualized.

Left Kidney: Length: 12.4 cm. Echogenicity within normal limits. No
mass or hydronephrosis visualized.

Abdominal aorta: No aneurysm visualized.

Other findings: No ascites visualized.
IMPRESSION: No ascites.

Echogenic hepatic parenchyma suggestive of hepatic steatosis.

No hydronephrosis.

Prior cholecystectomy.  No biliary ductal dilatation.

## 2018-01-12 ENCOUNTER — Other Ambulatory Visit: Payer: Self-pay | Admitting: *Deleted

## 2018-01-12 NOTE — Patient Outreach (Signed)
[name]) Care Management  01/12/2018  Pamela Collier September 05, 1940 [id number]   Telephone assessment   Successful outreach call to patient , HIPAA verified x 2 identifiers.  Patient reports that she is feeling pretty good, better than a few days ago.  Patient discussed feeling bad on last week, increased discomfort after therapy but they have changed her therapy with less weights and she is feeling better. Patient discussed that she plans to make and appointment with with Dr.Tullo , because it has been a while, she discussed that she prefers to see Dr.Tullo in office and not one else.  Patient reports that she continues to tolerate usual activities at home, completes her ADL's ,cooks when she feels like , has daily meals on wheels or husband assist or they eat out. Patient drives short distances near home.   Patient further discussed :  Diabetes Continues monitoring blood sugars daily and they are running better now, discussed recent family reunions and eating a little more than usual ,but she is back on track now. She reports her blood sugar was 123 on today, she denies having any low blood sugar readings.   Patient discussed that she continues to fill her weekly pill organizer and has all medications available.    Heart Failure  Patient continues to monitor weights daily and denies any sudden weight gains, swelling or shortness of breath.    Memory  Patient discussed continuing to take new medication as prescribed, but she can't tell any difference, discussed with patient medication prescribed to slow progression of memory loss.     Plan Patient agreeable to home visit, will schedule home visit in the next 2 weeks. Reviewed fall precautions and use of cane .      [name], RN, [place] Management Coordinator  [phone]- Mobile [phone]- Toll Free Main Office

## 2018-01-13 ENCOUNTER — Encounter: Payer: Self-pay | Admitting: Physical Therapy

## 2018-01-13 ENCOUNTER — Ambulatory Visit: Payer: Medicare Other | Attending: Internal Medicine | Admitting: Physical Therapy

## 2018-01-13 DIAGNOSIS — M5440 Lumbago with sciatica, unspecified side: Secondary | ICD-10-CM | POA: Insufficient documentation

## 2018-01-13 DIAGNOSIS — R2689 Other abnormalities of gait and mobility: Secondary | ICD-10-CM | POA: Insufficient documentation

## 2018-01-13 DIAGNOSIS — M6281 Muscle weakness (generalized): Secondary | ICD-10-CM | POA: Insufficient documentation

## 2018-01-13 NOTE — Therapy (Signed)
[place] MAIN [facility] SERVICES [address] [place], [place], [address] Phone: [phone]   Fax:  [phone]  Physical Therapy Treatment  Patient Details  [name] MRN: [id number] Date of Birth: [birth year] Referring Provider: [name]    Encounter Date: 01/13/2018  PT End of Session - 01/13/18 0941    Visit Number  10    Number of Visits  17    Date for PT Re-Evaluation  02/02/18    PT Start Time  0915    PT Stop Time  1000    PT Time Calculation (min)  45 min    Equipment Utilized During Treatment  Gait belt    Activity Tolerance  Patient tolerated treatment well    Behavior During Therapy  [facility] for tasks assessed/performed       Past Medical History:  Diagnosis Date  . Acute respiratory failure ([place]) 11/21/2014  . Anxiety   . Arthritis 08/05/2013  . Atherosclerotic peripheral vascular disease with gangrene [facility]) august 2012  . Cardiomyopathy, ischemic    a. EF 35 to 40% by echo in 2013 b. EF improved to 50-55% by echo in 04/2015; 11/2016 Echo: EF 55-60%, Gr1 DD, mildly dil LA, nl RV fxn.  . Cerebral infarct ([place]) 08/17/2013  . Chronic diastolic CHF (congestive heart failure) (HCC)    a. EF 50-55% by echo in 04/2015; b. 11/2016 Echo: EF 55-60%, Gr1 DD.  [name] COPD (chronic obstructive pulmonary disease) ([place])   . Coronary artery disease, occlusive    a. Previous PCI to the LAD, LCx, and RCA in 2010, 2011, 2013, and 2016. All performed in [place]; b. 11/2016 Lexiscan MV: EF 43%, no ischemia->low risk.  . 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 Diabetic diarrhea ([place]) 10/03/2014  . DM type 2, uncontrolled, with renal complications ([place]) 07/18/298  . Hepatic steatosis    by CT abd pelvis  . History of kidney stones    at a younger age  . Hyperlipidemia LDL goal <100 02/23/2014  . Hypertensive heart disease   . Osteoporosis, post-menopausal   . 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 Pill esophagitis   . Pleural effusion 10/25/2012   10/2012 CT chest >> small to moderate R lung effusion>> chylothorax, 100% lymphs 10/2013 thoracentesis> cytology negative, WBC 1471, > 90% "small lymphs" 01/2014 CT chest> near complete resolution of pleural effusion, stable lymphadenopathy 08/2014 CT chest [place] ([facility]): small right sided effusion decreased in size, stable mediastinal lymphadenopathy 1.0cm largest, 38m  . 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 Sleep apnea   . Stage III chronic kidney disease [facility])     Past Surgical History:  Procedure Laterality Date  . ABDOMINAL HYSTERECTOMY     at ge 39. secondary to bleeding/partial  . ANTERIOR CERVICAL DECOMP/DISCECTOMY FUSION N/A 10/10/2016   Procedure: Anterior Cervical Discectomy Fusion - Cervical four4- five - Cervical five-Cervical six - Cervical six-Cervical seven;  Surgeon: Earnie Larsson, MD;  Location: [place];  Service: Neurosurgery;  Laterality: N/A;  . BREAST CYST ASPIRATION Right   . CARPAL TUNNEL RELEASE Bilateral   . CHOLECYSTECTOMY  in [place]   . COLONOSCOPY WITH PROPOFOL N/A 01/09/2015   Procedure: COLONOSCOPY WITH PROPOFOL;  Surgeon: Lucilla Lame, MD;  Location: [facility] ENDOSCOPY;  Service: Endoscopy;  Laterality: N/A;  . CORONARY ANGIOPLASTY WITH STENT PLACEMENT     [place]; [facility]  . EYE SURGERY     bil cataracts  . HERNIA REPAIR     umbilical/ Dr Pat Patrick  . OOPHORECTOMY    . PTCA  August 2012   Right Posterior tibial artery , [place]  . TOE AMPUTATION  Sept 2012   Right 2nd toe, [place]    There were no vitals filed for this visit.  Subjective Assessment - 01/13/18 0937    Subjective  Patient  reports that she is walking much better and her legs are not hurting today.    Pertinent History  SEBA MADOLE presents for evaluation of  intermittent  left sided leg pain that began without fall or injury several weeks ago.  She state that the pain started  In her left foot,  In the  Toes, and  has ascended up the lateral side of her left leg and is accompanied by stiffness of both legs.  She also describes back pain radiates into left hip and left leg.  No recent fall.       Currently in Pain?  No/denies       TREATMENT Ther-ex  NuStep L2 x 4 minutes for warm-up (unbilled); Quantum leg press 120 # 2 x 20, fatigue monitored in order to find proper weight; Bridges 2 x 20 with 2 sec hold at top; Hooklying clams with GTB 2 x 20; LAQ with 3 lbs and 2s hold 2 x 20; Seated HS curls with GTB 2 x 20; Heel/toe rocks x 15 each direction; Standing mini squats in // bars with UE support 2 x 10;  Patient needs cues for correct technique and form.                        PT Education - 01/13/18 0941    Education Details  HEP    Person(s) Educated  Patient    Methods  Explanation;Tactile cues;Verbal cues    Comprehension  Verbalized understanding;Returned demonstration       PT Short Term Goals - 01/06/18 1114      PT SHORT TERM GOAL #1   Title  Patient will be independent in home exercise program to improve strength/mobility for better functional independence with ADLs.    Time  4    Period  Weeks    Status  On-going    Target Date  01/06/18      PT SHORT TERM GOAL #2   Title  Patient will complete five times sit to stand test in < 15 seconds indicating an increased LE strength and improved balance.    Baseline  01/06/18 =16.50 sec    Time  4    Period  Weeks    Status  On-going    Target Date  01/06/18        PT Long Term Goals - 01/06/18 1111      PT LONG TERM GOAL #1   Title  Patient will increase six minute walk test distance to >1000 for progression  to community ambulator and improve gait ability    Baseline  01/06/18=875    Time  8    Period  Weeks    Status  New  Target Date  02/02/18      PT LONG TERM GOAL #2   Title  Patient will increase 10 meter walk test to >1.39m/s as to improve gait speed for better community ambulation and to reduce fall risk.    Baseline  01/06/18  .88 m/sec    Time  8    Period  Weeks    Status  On-going    Target Date  02/02/18      PT LONG TERM GOAL #3   Title  Patient will reduce timed up and go to <11 seconds to reduce fall risk and demonstrate improved transfer/gait ability.    Baseline  01/06/18=15.08 sec    Time  8    Period  Weeks    Status  New    Target Date  02/02/18      PT LONG TERM GOAL #4   Title  Patient will be require no assist with ascend/descend 3 steps using Least restrictive assistive device.    Baseline  01/06/18 she needs the spc for steps    Time  8    Period  Weeks    Status  On-going            Plan - 01/13/18 0944    Clinical Impression Statement  Patient tolerated therapy session well. Pt performed balance activities with compliant surfaces as well as working on forwards/backwards stepping over object; required CGA-min A for minor LOB with VCs for proper technique and sequencing. Pt performed LE strengthening in standing, supine  and seated position with 2# ankle weights with decreased rest breaks; required CGA-supervision for safety and VCs for proper technique and form. Pt will continue to benefit from skilled PT intervention for improvements in balance, strength, and gait safety.    Rehab Potential  Good    PT Frequency  2x / week    PT Duration  8 weeks    PT Treatment/Interventions  Aquatic Therapy;Therapeutic activities;Therapeutic exercise;Balance training;Neuromuscular re-education;Patient/family education;Manual techniques;Dry needling    PT Next Visit Plan  BLE strengthening, balance training    Consulted and Agree with Plan of Care  Patient        Patient will benefit from skilled therapeutic intervention in order to improve the following deficits and impairments:  Abnormal gait, Decreased balance, Decreased mobility, Difficulty walking, Dizziness, Pain, Impaired flexibility, Decreased activity tolerance  Visit Diagnosis: Acute right-sided low back pain with sciatica, sciatica laterality unspecified  Muscle weakness (generalized)  Other abnormalities of gait and mobility     Problem List Patient Active Problem List   Diagnosis Date Noted  . Restless legs 10/17/2017  . Leg pain 10/15/2017  . Normocytic anemia, not due to blood loss 09/27/2017  . Melena 09/25/2017  . Cognitive decline 08/16/2017  . Polypharmacy 07/28/2017  . Pneumonia 05/26/2017  . Pill esophagitis 05/23/2017  . Chronic kidney disease (CKD), stage I 11/15/2016  . History of fall 11/12/2016  . Osteoarthritis 11/10/2016  . Right arm pain 10/15/2016  . Cervical stenosis of spinal canal 10/10/2016  . Cervical radiculopathy at C7 07/02/2016  . Acute midline thoracic back pain 07/02/2016  . Epigastric hernia 04/01/2016  . Umbilical pain 94/85/4627  . CAD in native artery   . Type 2 DM with CKD stage 3 and hypertension ([place])   . Chronic renal insufficiency   . Hypertensive heart disease   . OSA on CPAP 07/07/2015  . Hospital discharge follow-up 07/07/2015  . Chest pain 06/29/2015  . Pleural effusion, right   .  Chronic diastolic CHF (congestive heart failure) ([place]) 04/18/2015  . Diabetic neuropathy ([place]) 02/21/2015  . Benign neoplasm of sigmoid colon   . Low back pain with radiation 04/04/2014  . Essential hypertension 04/04/2014  . Hyperlipidemia 02/23/2014  . Long-term use of high-risk medication 02/23/2014  . Other long term (current) drug therapy 02/23/2014  . Dermatophytic onychia 08/17/2013  . Stable angina ([place]) 08/17/2013  . Cerebral infarct ([place]) 08/17/2013  . Bony exostosis 08/17/2013  . Obesity, unspecified 08/17/2013  . Arthritis  08/05/2013  . Unsteadiness on feet 03/24/2013  . History of colonic polyps 02/20/2013  . Sarcoidosis ([place]) 12/07/2012  . Dysphagia 12/07/2012  . Pulmonary nodule seen on imaging study 05/20/2012  . 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 Diabetic nephropathy associated with type 2 diabetes mellitus ([place]) 02/29/2012  . Cardiomyopathy, ischemic   . Hepatic steatosis   . Osteoporosis, post-menopausal   . Peripheral vascular disease due to secondary diabetes mellitus ([place])   . Double vessel coronary artery disease 12/14/2011    Alanson Puls, PT DPT 01/13/2018, 9:46 AM  [place] MAIN [facility] SERVICES [address] [place], [place], [address] Phone: [phone]   Fax:  [phone]  Name: TINNA KOLKER MRN: [id number] Date of Birth: 1940/06/24

## 2018-01-14 ENCOUNTER — Ambulatory Visit (INDEPENDENT_AMBULATORY_CARE_PROVIDER_SITE_OTHER): Payer: Medicare Other | Admitting: Family Medicine

## 2018-01-14 ENCOUNTER — Encounter: Payer: Self-pay | Admitting: Family Medicine

## 2018-01-14 ENCOUNTER — Ambulatory Visit (INDEPENDENT_AMBULATORY_CARE_PROVIDER_SITE_OTHER): Payer: Medicare Other

## 2018-01-14 VITALS — BP 100/44 | HR 68 | Temp 98.7°F | Resp 15 | Ht 67.0 in | Wt 186.4 lb

## 2018-01-14 DIAGNOSIS — R6889 Other general symptoms and signs: Secondary | ICD-10-CM | POA: Diagnosis not present

## 2018-01-14 DIAGNOSIS — R059 Cough, unspecified: Secondary | ICD-10-CM

## 2018-01-14 DIAGNOSIS — R05 Cough: Secondary | ICD-10-CM

## 2018-01-14 DIAGNOSIS — R0982 Postnasal drip: Secondary | ICD-10-CM

## 2018-01-14 DIAGNOSIS — J189 Pneumonia, unspecified organism: Secondary | ICD-10-CM

## 2018-01-14 DIAGNOSIS — J3089 Other allergic rhinitis: Secondary | ICD-10-CM

## 2018-01-14 MED ORDER — LORATADINE 10 MG PO TABS: 10.0000 mg | ORAL_TABLET | Freq: Every day | ORAL | 2 refills | Status: DC

## 2018-01-14 MED ORDER — GUAIFENESIN ER 600 MG PO TB12: 600.0000 mg | ORAL_TABLET | Freq: Two times a day (BID) | ORAL | 0 refills | Status: DC

## 2018-01-14 NOTE — Progress Notes (Signed)
Subjective:    Patient ID: Pamela Collier, female    DOB: [birth year], 77 y.o.   MRN: [id number]  HPI  Patient presents to clinic with a runny nose, watery itchy eyes, cough with clear white phlegm for the past 3 weeks.  Denies any fever or chills.  States cough will keep her husband awake at night.  Denies wheezing or shortness of breath.  Currently she does not take an over-the-counter antihistamine.  States that pharmacist recommended a medication, states this medication did help her symptoms but she is unsure of name.   Patient Active Problem List   Diagnosis Date Noted  . Restless legs 10/17/2017  . Leg pain 10/15/2017  . Normocytic anemia, not due to blood loss 09/27/2017  . Melena 09/25/2017  . Cognitive decline 08/16/2017  . Polypharmacy 07/28/2017  . Pneumonia 05/26/2017  . Pill esophagitis 05/23/2017  . Chronic kidney disease (CKD), stage I 11/15/2016  . History of fall 11/12/2016  . Osteoarthritis 11/10/2016  . Right arm pain 10/15/2016  . Cervical stenosis of spinal canal 10/10/2016  . Cervical radiculopathy at C7 07/02/2016  . Acute midline thoracic back pain 07/02/2016  . Epigastric hernia 04/01/2016  . Umbilical pain 57/26/2035  . CAD in native artery   . Type 2 DM with CKD stage 3 and hypertension ([place])   . Chronic renal insufficiency   . Hypertensive heart disease   . OSA on CPAP 07/07/2015  . Hospital discharge follow-up 07/07/2015  . Chest pain 06/29/2015  . Pleural effusion, right   . Chronic diastolic CHF (congestive heart failure) ([place]) 04/18/2015  . Diabetic neuropathy ([place]) 02/21/2015  . Benign neoplasm of sigmoid colon   . Low back pain with radiation 04/04/2014  . Essential hypertension 04/04/2014  . Hyperlipidemia 02/23/2014  . Long-term use of high-risk medication 02/23/2014  . Other long term (current) drug therapy 02/23/2014  . Dermatophytic onychia 08/17/2013  . Stable angina ([place]) 08/17/2013  . Cerebral infarct ([place]) 08/17/2013  . Bony  exostosis 08/17/2013  . Obesity, unspecified 08/17/2013  . Arthritis 08/05/2013  . Unsteadiness on feet 03/24/2013  . History of colonic polyps 02/20/2013  . Sarcoidosis ([place]) 12/07/2012  . Dysphagia 12/07/2012  . Pulmonary nodule seen on imaging study 05/20/2012  . 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 Diabetic nephropathy associated with type 2 diabetes mellitus ([place]) 02/29/2012  . Cardiomyopathy, ischemic   . Hepatic steatosis   . Osteoporosis, post-menopausal   . Peripheral vascular disease due to secondary diabetes mellitus ([place])   . Double vessel coronary artery disease 12/14/2011   Social History   Tobacco Use  . Smoking status: Never Smoker  . Smokeless tobacco: Never Used  Substance Use Topics  . Alcohol use: No    Alcohol/week: 0.0 standard drinks     Review of Systems  Constitutional: Negative for chills, fatigue and fever.  HENT: + congestion, runny nose, itchy eyes.   Eyes: Negative.   Respiratory: Cough with clear or white phlegm. Negative for shortness of breath and wheezing.   Cardiovascular: Negative for chest pain, palpitations and leg swelling.  Gastrointestinal: Negative for abdominal pain, diarrhea, nausea and vomiting.  Genitourinary: Negative for dysuria, frequency and urgency.  Musculoskeletal: Negative for arthralgias and myalgias.  Skin: Negative for color change, pallor and rash.  Neurological: Negative for syncope, light-headedness and headaches.  Psychiatric/Behavioral: The patient is not nervous/anxious.    Objective:   Physical Exam  Constitutional: She is oriented to person, place, and time. She appears well-developed and well-nourished.  No distress.  HENT:  Head: Normocephalic and atraumatic.  Right Ear: External ear normal.  Left Ear: External ear normal.  Mouth/Throat: No oropharyngeal exudate.  +rhinorrhea, clear. +post nasal drip.   Eyes: Pupils are equal, round, and reactive to light. Conjunctivae and EOM are normal. Right eye  exhibits no discharge. Left eye exhibits no discharge. No scleral icterus.  Neck: Neck supple. No tracheal deviation present.  Cardiovascular: Normal rate and regular rhythm.  Pulmonary/Chest: Effort normal and breath sounds normal. No stridor. No respiratory distress. She has no wheezes. She has no rales.  Musculoskeletal: She exhibits no edema.  Lymphadenopathy:    She has no cervical adenopathy.  Neurological: She is alert and oriented to person, place, and time.  Walks with cane, gait at baseline  Skin: Skin is warm and dry. She is not diaphoretic. No pallor.  Psychiatric: She has a normal mood and affect. Her behavior is normal.  Nursing note and vitals reviewed.   Vitals:   01/14/18 1621  BP: (!) 100/44  Pulse: 68  Resp: 15  Temp: 98.7 F (37.1 C)  SpO2: 96%       Assessment & Plan:    Cough-patient will use Mucinex twice a day as needed to help calm cough.  We will also get chest x-ray due to length of time with cough to be sure no pneumonia is present. Lungs are clear on exam.   Postnasal drip/congestion- suspect cough is related to postnasal drip.  She will begin Claritin daily to help dry up congestion.  Patient has nasal spray Flonase at home, advised to use this nasal spray also.  Itchy eyes - Claritin will help this. Can also use saline eye drops to help any feelings of dry/itchy eyes.   Suspect postnasal drip/nasal congestion/itchy eyes is a result of seasonal allergies.  I am hoping adding the Claritin in addition to the Flonase she Artie has at home will improve her symptoms.  Keep regular follow-up appointment as planned on January 26, 2018.

## 2018-01-14 NOTE — Patient Instructions (Signed)
Postnasal Drip  Postnasal drip is the feeling of mucus going down the back of your throat. Mucus is a slimy substance that moistens and cleans your nose and throat, as well as the air pockets in face bones near your forehead and cheeks (sinuses). Small amounts of mucus pass from your nose and sinuses down the back of your throat all the time. This is normal. When you produce too much mucus or the mucus gets too thick, you can feel it.  Some common causes of postnasal drip include:  · Having more mucus because of:  ? A cold or the flu.  ? Allergies.  ? Cold air.  ? Certain medicines.  · Having more mucus that is thicker because of:  ? A sinus or nasal infection.  ? Dry air.  ? A food allergy.    Follow these instructions at home:  Relieving discomfort  · Gargle with a salt-water mixture 3–4 times a day or as needed. To make a salt-water mixture, completely dissolve ½–1 tsp of salt in 1 cup of warm water.  · If the air in your home is dry, use a humidifier to add moisture to the air.  · Use a saline spray or container (neti pot) to flush out the nose (nasal irrigation). These methods can help clear away mucus and keep the nasal passages moist.  General instructions  · Take over-the-counter and prescription medicines only as told by your health care provider.  · Follow instructions from your health care provider about eating or drinking restrictions. You may need to avoid caffeine.  · Avoid things that you know you are allergic to (allergens), like dust, mold, pollen, pets, or certain foods.  · Drink enough fluid to keep your urine pale yellow.  · Keep all follow-up visits as told by your health care provider. This is important.  Contact a health care provider if:  · You have a fever.  · You have a sore throat.  · You have difficulty swallowing.  · You have headache.  · You have sinus pain.  · You have a cough that does not go away.  · The mucus from your nose becomes thick and is green or yellow in color.   · You have cold or flu symptoms that last more than 10 days.  Summary  · Postnasal drip is the feeling of mucus going down the back of your throat.  · If your health care provider approves, use nasal irrigation or a nasal spray 2?4 times a day.  · Avoid things that you know you are allergic to (allergens), like dust, mold, pollen, pets, or certain foods.  This information is not intended to replace advice given to you by your health care provider. Make sure you discuss any questions you have with your health care provider.  Document Released: 08/11/2016 Document Revised: 08/11/2016 Document Reviewed: 08/11/2016  Elsevier Interactive Patient Education © 2018 [employer] Inc.

## 2018-01-15 ENCOUNTER — Encounter: Payer: Self-pay | Admitting: Family Medicine

## 2018-01-15 DIAGNOSIS — J3089 Other allergic rhinitis: Secondary | ICD-10-CM | POA: Insufficient documentation

## 2018-01-15 DIAGNOSIS — R0982 Postnasal drip: Secondary | ICD-10-CM | POA: Insufficient documentation

## 2018-01-15 MED ORDER — LEVOFLOXACIN 500 MG PO TABS: 500.0000 mg | ORAL_TABLET | Freq: Every day | ORAL | 0 refills | Status: DC

## 2018-01-15 NOTE — Progress Notes (Unsigned)
Antibiotics sent in.  Follow up CXR ordered to be done in 4 weeks.

## 2018-01-18 ENCOUNTER — Telehealth: Payer: Self-pay

## 2018-01-18 NOTE — Telephone Encounter (Signed)
Copied from [place] [id number]. Topic: General - Other >> Jan 18, 2018  4:58 PM Valla Leaver wrote: Reason for CRM: Patient calling for work in in September. She has pneumonia and was originally scheduled for 01/26/2018 at 4pm, but changed to 10/22 because her mother is sick. Patient wants to know also if its okay for her to travel out of state in her condition?

## 2018-01-19 ENCOUNTER — Other Ambulatory Visit: Payer: Self-pay | Admitting: Internal Medicine

## 2018-01-19 NOTE — Telephone Encounter (Signed)
I cannot answer that question !  I haven't seen her

## 2018-01-19 NOTE — Telephone Encounter (Signed)
Is it okay to change pt's appt to an available appt in September? Pt is also wanting to know if it is okay for her to travel out of state in her condition?

## 2018-01-20 ENCOUNTER — Ambulatory Visit: Payer: Medicare Other | Admitting: Physical Therapy

## 2018-01-20 ENCOUNTER — Other Ambulatory Visit: Payer: Self-pay | Admitting: Internal Medicine

## 2018-01-20 ENCOUNTER — Other Ambulatory Visit: Payer: Self-pay | Admitting: *Deleted

## 2018-01-20 ENCOUNTER — Encounter: Payer: Self-pay | Admitting: Physical Therapy

## 2018-01-20 DIAGNOSIS — M5440 Lumbago with sciatica, unspecified side: Secondary | ICD-10-CM | POA: Diagnosis not present

## 2018-01-20 DIAGNOSIS — M6281 Muscle weakness (generalized): Secondary | ICD-10-CM

## 2018-01-20 DIAGNOSIS — R2689 Other abnormalities of gait and mobility: Secondary | ICD-10-CM | POA: Diagnosis not present

## 2018-01-20 MED ORDER — BUDESONIDE-FORMOTEROL FUMARATE 160-4.5 MCG/ACT IN AERO: 2.0000 | INHALATION_SPRAY | Freq: Two times a day (BID) | RESPIRATORY_TRACT | 6 refills | Status: DC

## 2018-01-20 MED ORDER — FLUTICASONE PROPIONATE 50 MCG/ACT NA SUSP: 2.0000 | Freq: Every day | NASAL | 5 refills | Status: AC | PRN

## 2018-01-20 NOTE — Telephone Encounter (Signed)
PT STATES SHE ALSO NEEDS TO SPRAY SHE SPRAYS IN HER MOUTH, SHE IS NOT SURE THE NAME OF IT.

## 2018-01-20 NOTE — Telephone Encounter (Signed)
Spoke with pt and she stated that she is feeling fine and believes that she will be fine to travel. Pt's appt from 03/02/2018 has been canceled and rescheduled.

## 2018-01-20 NOTE — Patient Outreach (Signed)
[facility]) Care Management  01/20/2018  ASAL TEAS July 29, 1940 [id number]  Telephone assessment   Incoming call from patient, she reports that she is doing okay , discussed feeling some better from her cold.  Patient discussed the death of her 30 and her mother sick is sick and she is planning to go out of town for a few weeks, returning the end of the month. Patient request a call in the next month for follow up.  Patient discussed that her blood sugar has been running in  about the same in the 120 to 150 range no low blood sugar reading.   Patient discussed attending therapy and states he is helping her walking.   Reinforced with patient taking all of her medications, CPAP, blood glucose meter.    Plan Will plan follow up call in the next month to schedule home visit for continued complex care management of chronic condition of Diabetes.   [name], RN, [place] Management Coordinator  [phone]- Mobile [phone]- Toll Free Main Office

## 2018-01-20 NOTE — Therapy (Signed)
[place] MAIN [facility] SERVICES [address] [place], [place], [address] Phone: [phone]   Fax:  [phone]  Physical Therapy Treatment  Patient Details  [name] MRN: [id number] Date of Birth: August 19, 1940 Referring Provider: [name]    Encounter Date: 01/20/2018  PT End of Session - 01/20/18 1344    Visit Number  11    Number of Visits  17    Date for PT Re-Evaluation  02/02/18    PT Start Time  0145    PT Stop Time  0225    PT Time Calculation (min)  40 min    Equipment Utilized During Treatment  Gait belt    Activity Tolerance  Patient tolerated treatment well    Behavior During Therapy  WFL for tasks assessed/performed       Past Medical History:  Diagnosis Date  . Acute respiratory failure ([place]) 11/21/2014  . Anxiety   . Arthritis 08/05/2013  . Atherosclerotic peripheral vascular disease with gangrene [facility]) august 2012  . Cardiomyopathy, ischemic    a. EF 35 to 40% by echo in 2013 b. EF improved to 50-55% by echo in 04/2015; 11/2016 Echo: EF 55-60%, Gr1 DD, mildly dil LA, nl RV fxn.  . Cerebral infarct ([place]) 08/17/2013  . Chronic diastolic CHF (congestive heart failure) (HCC)    a. EF 50-55% by echo in 04/2015; b. 11/2016 Echo: EF 55-60%, Gr1 DD.  [name] COPD (chronic obstructive pulmonary disease) ([place])   . Coronary artery disease, occlusive    a. Previous PCI to the LAD, LCx, and RCA in 2010, 2011, 2013, and 2016. All performed in [place]; b. 11/2016 Lexiscan MV: EF 43%, no ischemia->low risk.  . 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 Diabetic diarrhea ([place]) 10/03/2014  . DM type 2, uncontrolled, with renal complications ([place]) 05/14/2438  . Hepatic steatosis    by CT abd pelvis  . History of kidney stones    at a younger age  . Hyperlipidemia LDL goal <100 02/23/2014  . Hypertensive heart disease   . Osteoporosis, post-menopausal   . 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 Pill esophagitis   . Pleural effusion 10/25/2012   10/2012 CT chest >> small to moderate R lung effusion>> chylothorax, 100% lymphs 10/2013 thoracentesis> cytology negative, WBC 1471, > 90% "small lymphs" 01/2014 CT chest> near complete resolution of pleural effusion, stable lymphadenopathy 08/2014 CT chest [place] ([facility]): small right sided effusion decreased in size, stable mediastinal lymphadenopathy 1.0cm largest, 19m  . 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 Sleep apnea   . Stage III chronic kidney disease [facility])     Past Surgical History:  Procedure Laterality Date  . ABDOMINAL HYSTERECTOMY     at ge 39. secondary to bleeding/partial  . ANTERIOR CERVICAL DECOMP/DISCECTOMY FUSION N/A 10/10/2016   Procedure: Anterior Cervical Discectomy Fusion - Cervical four4- five - Cervical five-Cervical six - Cervical six-Cervical seven;  Surgeon: Earnie Larsson, MD;  Location: [place];  Service: Neurosurgery;  Laterality: N/A;  . BREAST CYST ASPIRATION Right   . CARPAL TUNNEL RELEASE Bilateral   . CHOLECYSTECTOMY  in [place]   . COLONOSCOPY WITH PROPOFOL N/A 01/09/2015   Procedure: COLONOSCOPY WITH PROPOFOL;  Surgeon: Lucilla Lame, MD;  Location: [facility] ENDOSCOPY;  Service: Endoscopy;  Laterality: N/A;  . CORONARY ANGIOPLASTY WITH STENT PLACEMENT     [place]; [facility]  . EYE SURGERY     bil cataracts  . HERNIA REPAIR     umbilical/ Dr Pat Patrick  . OOPHORECTOMY    . PTCA  August 2012   Right Posterior tibial artery , [place]  . TOE AMPUTATION  Sept 2012   Right 2nd toe, [place]    There were no vitals filed for this visit.  Subjective Assessment - 01/20/18 1352    Subjective  Patient  reports that she is walking much better and her legs are not hurting today. She is walking without her cane today. She feels worn out because her Aunt died on 09-02-22  and she has been traveling back and forth. She needs to go up to [place] to visit her mom and will be back to therapy in October.     Pertinent History  Pamela Collier presents for evaluation of  intermittent  left sided leg pain that began without fall or injury several weeks ago.  She state that the pain started  In her left foot,  In the  Toes, and  has ascended up the lateral side of her left leg and is accompanied by stiffness of both legs.  She also describes back pain radiates into left hip and left leg.  No recent fall.       Currently in Pain?  No/denies    Pain Score  0-No pain       Ther-ex NuStep L2 x 4 minutes for warm-up (unbilled); Quantum leg press 120# 2x20, fatigue monitored in order to find proper weight; Bridges2 x 20 with 2 sec hold at top; Hooklyingclamswith GTB2x 20; LAQ with 3 lbs and2s hold 2 x 20; Seated HS curls with GTB 2 x 20; Heel/toe rocks x 15 each direction; Standing mini squats in // bars with UE support 2 x 10;  Patient needs cues for correct technique and form.                         PT Education - 01/20/18 1344    Education Details  hep, saftey, balance    Person(s) Educated  Patient    Methods  Explanation;Demonstration;Verbal cues    Comprehension  Verbalized understanding;Returned demonstration       PT Short Term Goals - 01/06/18 1114      PT SHORT TERM GOAL #1   Title  Patient will be independent in home exercise program to improve strength/mobility for better functional independence with ADLs.    Time  4    Period  Weeks    Status  On-going    Target Date  01/06/18      PT SHORT TERM GOAL #2   Title  Patient will complete five times sit to stand test in < 15 seconds indicating an increased LE strength and improved balance.    Baseline  01/06/18  =16.50 sec    Time  4    Period  Weeks    Status  On-going    Target Date  01/06/18        PT Long Term Goals - 01/06/18 1111      PT LONG TERM GOAL #1   Title  Patient  will increase six minute walk test distance to >1000 for progression to community ambulator and improve gait ability    Baseline  01/06/18=875    Time  8    Period  Weeks    Status  New    Target Date  02/02/18      PT LONG TERM GOAL #2   Title  Patient will increase 10 meter walk test to >1.43m/s as to improve gait speed for better community ambulation and to reduce fall risk.    Baseline  01/06/18  .88 m/sec    Time  8    Period  Weeks    Status  On-going    Target Date  02/02/18      PT LONG TERM GOAL #3   Title  Patient will reduce timed up and go to <11 seconds to reduce fall risk and demonstrate improved transfer/gait ability.    Baseline  01/06/18=15.08 sec    Time  8    Period  Weeks    Status  New    Target Date  02/02/18      PT LONG TERM GOAL #4   Title  Patient will be require no assist with ascend/descend 3 steps using Least restrictive assistive device.    Baseline  01/06/18 she needs the spc for steps    Time  8    Period  Weeks    Status  On-going            Plan - 01/20/18 1345    Clinical Impression Statement  Patient demonstrated improved coordination with increased repetition and practice. Initiation of new task requires verbal, visual, and demonstrative cueing to be performed with tactile correction. Decreased correction and cueing was needed as task was repeated. Patient able to perform open and closed chain exercises.  Patient would benefit from additional skilled PT intervention to improve coordination and balance.    Rehab Potential  Good    PT Frequency  2x / week    PT Duration  8 weeks    PT Treatment/Interventions  Aquatic Therapy;Therapeutic activities;Therapeutic exercise;Balance training;Neuromuscular re-education;Patient/family education;Manual techniques;Dry needling     PT Next Visit Plan  BLE strengthening, balance training    Consulted and Agree with Plan of Care  Patient       Patient will benefit from skilled therapeutic intervention in order to improve the following deficits and impairments:  Abnormal gait, Decreased balance, Decreased mobility, Difficulty walking, Dizziness, Pain, Impaired flexibility, Decreased activity tolerance  Visit Diagnosis: Acute right-sided low back pain with sciatica, sciatica laterality unspecified  Muscle weakness (generalized)  Other abnormalities of gait and mobility     Problem List Patient Active Problem List   Diagnosis Date Noted  . Environmental and seasonal allergies 01/15/2018  . Post-nasal drip 01/15/2018  . Restless legs 10/17/2017  . Leg pain 10/15/2017  . Normocytic anemia, not due to blood loss 09/27/2017  . Melena 09/25/2017  . Cognitive decline 08/16/2017  . Polypharmacy 07/28/2017  . Pneumonia 05/26/2017  . Pill esophagitis 05/23/2017  . Chronic kidney disease (CKD), stage I 11/15/2016  . History of fall 11/12/2016  . Osteoarthritis 11/10/2016  . Right arm pain 10/15/2016  . Cervical stenosis of spinal canal 10/10/2016  . Cervical radiculopathy at C7 07/02/2016  . Acute midline thoracic back pain 07/02/2016  . Epigastric hernia 04/01/2016  . Umbilical pain 22/06/5425  . CAD in native artery   . Type 2 DM with CKD stage 3 and hypertension ([place])   . Chronic  renal insufficiency   . Hypertensive heart disease   . OSA on CPAP 07/07/2015  . Hospital discharge follow-up 07/07/2015  . Chest pain 06/29/2015  . Pleural effusion, right   . Chronic diastolic CHF (congestive heart failure) ([place]) 04/18/2015  . Diabetic neuropathy ([place]) 02/21/2015  . Benign neoplasm of sigmoid colon   . Low back pain with radiation 04/04/2014  . Essential hypertension 04/04/2014  . Hyperlipidemia 02/23/2014  . Long-term use of high-risk medication 02/23/2014  . Other long term (current) drug therapy  02/23/2014  . Dermatophytic onychia 08/17/2013  . Stable angina ([place]) 08/17/2013  . Cerebral infarct ([place]) 08/17/2013  . Bony exostosis 08/17/2013  . Obesity, unspecified 08/17/2013  . Arthritis 08/05/2013  . Unsteadiness on feet 03/24/2013  . History of colonic polyps 02/20/2013  . Sarcoidosis ([place]) 12/07/2012  . Dysphagia 12/07/2012  . Pulmonary nodule seen on imaging study 05/20/2012  . 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 Diabetic nephropathy associated with type 2 diabetes mellitus ([place]) 02/29/2012  . Cardiomyopathy, ischemic   . Hepatic steatosis   . Osteoporosis, post-menopausal   . Peripheral vascular disease due to secondary diabetes mellitus ([place])   . Double vessel coronary artery disease 12/14/2011    Alanson Puls, PT DPT 01/20/2018, 1:54 PM  [place] MAIN [facility] SERVICES [address] [place], [place], [address] Phone: [phone]   Fax:  [phone]  [name] MRN: [id number] Date of Birth: [birth year]

## 2018-01-20 NOTE — Telephone Encounter (Signed)
Left message with husband to have pt give [place] a call when she gets home. Please transfer pt to our office.

## 2018-01-21 ENCOUNTER — Telehealth: Payer: Self-pay | Admitting: Internal Medicine

## 2018-01-21 ENCOUNTER — Other Ambulatory Visit: Payer: Self-pay | Admitting: *Deleted

## 2018-01-21 ENCOUNTER — Ambulatory Visit: Payer: Self-pay | Admitting: *Deleted

## 2018-01-21 NOTE — Telephone Encounter (Signed)
Refilled: 06/05/2017 Last OV: 10/27/2017 Next OV: 02/10/2018

## 2018-01-21 NOTE — Telephone Encounter (Signed)
PA denied Appeal process has been started. Advair Dulera and Symbicort all require PA. Appeal will be processed as urgent.

## 2018-01-21 NOTE — Telephone Encounter (Signed)
Tried to call pt. Number was busy.

## 2018-01-21 NOTE — Telephone Encounter (Signed)
PA for Symbicort initiated via covermymeds. Pending decision.

## 2018-01-21 NOTE — Telephone Encounter (Signed)
Pt c/o medication issue:  1. Name of Medication: symbicort    2. How are you currently taking this medication (dosage and times per day)? Inh 2 puffs BID   3. Are you having a reaction (difficulty breathing--STAT)?  No   4. What is your medication issue? Cannot afford $400 refill please call to discuss options patient going out of town for a funeral and needs solution today

## 2018-01-21 NOTE — Telephone Encounter (Signed)
Returned call and left message with spouse. PA already initiated for Symbicort this am. If patient can't afford then she will need to call her ins to see what covered alternative are available on her plan.

## 2018-01-21 NOTE — Telephone Encounter (Signed)
Please call patient with a solution for today

## 2018-01-25 ENCOUNTER — Other Ambulatory Visit: Payer: Self-pay | Admitting: Internal Medicine

## 2018-01-25 ENCOUNTER — Ambulatory Visit: Payer: Medicare Other | Admitting: Physical Therapy

## 2018-01-25 NOTE — Telephone Encounter (Signed)
CaseId:[id number];Status:Denied;Review Type:Prior Auth;Appeal Information: [place] [id number]. [phone] Phone:[phone] Fax:[phone] WebAddress:[email or web address];   [place] [id number]  Called to check the status of appeal for patient: 6:16 mins and was disconnected.

## 2018-01-26 ENCOUNTER — Ambulatory Visit: Payer: Medicare Other | Admitting: Internal Medicine

## 2018-01-26 NOTE — Telephone Encounter (Signed)
Called patient plan to get appeals decision. Per Janett Billow 1st level appeal denied. Asked what covered plan alternative was. There is not one but Wixela may be covered. Asked to speak with someone in appeals held on for  5 mins.  Will call patient and make her aware she will need to call her Medicare drug plan to see what is covered. There may have been a change to coverage?

## 2018-01-26 NOTE — Telephone Encounter (Signed)
Symbicort not covered and denied by PA and 1st level appeal. We can try Wixela and Dulera to see if ins will cover.

## 2018-01-27 ENCOUNTER — Ambulatory Visit: Payer: Medicare Other | Admitting: Physical Therapy

## 2018-02-01 ENCOUNTER — Other Ambulatory Visit: Payer: Self-pay | Admitting: Internal Medicine

## 2018-02-01 DIAGNOSIS — I129 Hypertensive chronic kidney disease with stage 1 through stage 4 chronic kidney disease, or unspecified chronic kidney disease: Principal | ICD-10-CM

## 2018-02-01 DIAGNOSIS — E1122 Type 2 diabetes mellitus with diabetic chronic kidney disease: Secondary | ICD-10-CM

## 2018-02-01 DIAGNOSIS — N183 Chronic kidney disease, stage 3 (moderate): Principal | ICD-10-CM

## 2018-02-01 MED ORDER — MOMETASONE FURO-FORMOTEROL FUM 200-5 MCG/ACT IN AERO: 2.0000 | INHALATION_SPRAY | Freq: Two times a day (BID) | RESPIRATORY_TRACT | 3 refills | Status: DC

## 2018-02-01 NOTE — Telephone Encounter (Signed)
Patient calling to check on status Would like an update on Dulera medication  Please call to discuss

## 2018-02-01 NOTE — Telephone Encounter (Signed)
Pt.notified

## 2018-02-01 NOTE — Addendum Note (Signed)
Addended by: Stephanie Coup on: 02/01/2018 04:34 PM   Modules accepted: Orders

## 2018-02-01 NOTE — Telephone Encounter (Signed)
Per Dr. Juanell Fairly he would like to try [facility]. Rx has been sent to pharmacy.

## 2018-02-03 ENCOUNTER — Encounter: Payer: Self-pay | Admitting: Physical Therapy

## 2018-02-03 ENCOUNTER — Ambulatory Visit: Payer: Medicare Other | Admitting: Physical Therapy

## 2018-02-03 DIAGNOSIS — M5440 Lumbago with sciatica, unspecified side: Secondary | ICD-10-CM

## 2018-02-03 DIAGNOSIS — R2689 Other abnormalities of gait and mobility: Secondary | ICD-10-CM

## 2018-02-03 DIAGNOSIS — M6281 Muscle weakness (generalized): Secondary | ICD-10-CM

## 2018-02-03 NOTE — Therapy (Signed)
[place] MAIN [facility] SERVICES [address] [place], [place], [address] Phone: [phone]   Fax:  [phone]  Physical Therapy Treatment/ re-certification  Patient Details  [name] MRN: [id number] Date of Birth: 02-03-1941 Referring Provider: [name]    Encounter Date: 02/03/2018  PT End of Session - 02/03/18 1404    Visit Number  12    Number of Visits  17    Date for PT Re-Evaluation  02/02/18    Equipment Utilized During Treatment  Gait belt    Activity Tolerance  Patient tolerated treatment well    Behavior During Therapy  [facility] for tasks assessed/performed       Past Medical History:  Diagnosis Date  . Acute respiratory failure ([place]) 11/21/2014  . Anxiety   . Arthritis 08/05/2013  . Atherosclerotic peripheral vascular disease with gangrene [facility]) august 2012  . Cardiomyopathy, ischemic    a. EF 35 to 40% by echo in 2013 b. EF improved to 50-55% by echo in 04/2015; 11/2016 Echo: EF 55-60%, Gr1 DD, mildly dil LA, nl RV fxn.  . Cerebral infarct ([place]) 08/17/2013  . Chronic diastolic CHF (congestive heart failure) (HCC)    a. EF 50-55% by echo in 04/2015; b. 11/2016 Echo: EF 55-60%, Gr1 DD.  [name] COPD (chronic obstructive pulmonary disease) ([place])   . Coronary artery disease, occlusive    a. Previous PCI to the LAD, LCx, and RCA in 2010, 2011, 2013, and 2016. All performed in [place]; b. 11/2016 Lexiscan MV: EF 43%, no ischemia->low risk.  . 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 Diabetic diarrhea ([place]) 10/03/2014  . DM type 2, uncontrolled, with renal complications ([place]) 0/12/6759  . Hepatic steatosis    by CT abd pelvis  . History of kidney stones    at a younger age  . Hyperlipidemia LDL goal <100 02/23/2014  . Hypertensive heart disease   . Osteoporosis, post-menopausal   . 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 Pill esophagitis   .  Pleural effusion 10/25/2012   10/2012 CT chest >> small to moderate R lung effusion>> chylothorax, 100% lymphs 10/2013 thoracentesis> cytology negative, WBC 1471, > 90% "small lymphs" 01/2014 CT chest> near complete resolution of pleural effusion, stable lymphadenopathy 08/2014 CT chest [place] ([facility]): small right sided effusion decreased in size, stable mediastinal lymphadenopathy 1.0cm largest, 31m . [place]oidosis ([place] 12/07/2012   [place] 03/2013 Full PFT [facility] > UNACCEPTABLE AND NOT REPRODUCIBLE DATA> Ratio 71% FEV 1 1.02 L (55% pred), FVC 1.31 L (49% pred) could not do lung volumes or DLCO   . Sleep apnea   . Stage III chronic kidney disease ([facility]     Past Surgical History:  Procedure Laterality Date  . ABDOMINAL HYSTERECTOMY     at ge 39. secondary to bleeding/partial  . ANTERIOR CERVICAL DECOMP/DISCECTOMY FUSION N/A 10/10/2016   Procedure: Anterior Cervical Discectomy Fusion - Cervical four4- five - Cervical five-Cervical six - Cervical six-Cervical seven;  Surgeon: PEarnie Larsson MD;  [place]  Service: Neurosurgery;  Laterality: N/A;  . BREAST CYST ASPIRATION Right   . CARPAL TUNNEL RELEASE Bilateral   . [place]     [place]  . COLONOSCOPY WITH PROPOFOL N/A 01/09/2015   Procedure: COLONOSCOPY WITH PROPOFOL;  Surgeon: DLucilla Lame MD;  Location: [facility] ENDOSCOPY;  Service: Endoscopy;  Laterality: N/A;  . CORONARY ANGIOPLASTY WITH STENT PLACEMENT     [place]; [facility]  . EYE SURGERY     bil cataracts  . HERNIA REPAIR     umbilical/ Dr Pat Patrick  . OOPHORECTOMY    . PTCA  August 2012   Right Posterior tibial artery , [place]  . TOE AMPUTATION  Sept 2012   Right 2nd toe, [place]    There were no vitals filed for this visit.   Treatment:  Performed outcome measures to assess her goals. She performs TUG, 5 x sit to stand, 6 MW test, ascending and  descending steps Standing on airex beam:  Tandem stance on airex beam without rail assist 10 sec hold x4 each foot in front with CGA for safety and cues to improve upper trunk control for better balance control; Side stepping down airex beam without rail assist x3 laps each direction with cues to keep feet on beam and avoid stepping off and mod assist  Standing with feet apart, BUE ball toss x10 unsupported with close supervision; Patient exhibits posterior loss of balance requiring cues for forward weight shift                         PT Education - 02/03/18 1403    Education Details  HEP    Person(s) Educated  Patient    Methods  Explanation    Comprehension  Verbalized understanding;Returned demonstration       PT Short Term Goals - 01/06/18 1114      PT SHORT TERM GOAL #1   Title  Patient will be independent in home exercise program to improve strength/mobility for better functional independence with ADLs.    Time  4    Period  Weeks    Status  On-going    Target Date  01/06/18      PT SHORT TERM GOAL #2   Title  Patient will complete five times sit to stand test in < 15 seconds indicating an increased LE strength and improved balance.    Baseline  01/06/18 =16.50 sec    Time  4    Period  Weeks    Status  On-going    Target Date  01/06/18        PT Long Term Goals - 02/03/18 1405      PT LONG TERM GOAL #1   Title  Patient will increase six minute walk test distance to >1000 for progression to community ambulator and improve gait ability    Baseline  01/06/18=875, 850 feet 02/03/18    Time  8    Period  Weeks    Status  Partially Met    Target Date  03/31/18      PT LONG TERM GOAL #2   Title  Patient will increase 10 meter walk test to >1.33ms as to improve gait speed for better community ambulation and to reduce fall risk.    Baseline  01/06/18  .88 m/sec;, ,721mec    Time  8    Period  Weeks    Status  Partially Met    Target Date  03/31/18       PT LONG TERM GOAL #3   Title  Patient will reduce timed up and go to <11 seconds to reduce fall risk and demonstrate improved transfer/gait ability.    Baseline  01/06/18=15.08 sec, 13.80 sec  Time  8    Period  Weeks    Status  Partially Met    Target Date  03/31/18      PT LONG TERM GOAL #4   Title  Patient will be require no assist with ascend/descend 3 steps using Least restrictive assistive device.    Baseline  01/06/18 she needs the spc for steps    Time  8    Period  Weeks    Status  Partially Met    Target Date  03/31/18            Plan - 02/03/18 1432    Clinical Impression Statement  Patient continues to have balance difficulty, and muscle weakness impairing gait. Pt currently presents with significant balance deficits, muscle weakness, and impaired safety awareness.Patient was out of the state visiting her family the last several weeks and was not able to attend her PT sessions.  Pt would benefit from continued skilled PT to address these deficits and decrease risk of falls.    Rehab Potential  Good    PT Frequency  2x / week    PT Duration  8 weeks    PT Treatment/Interventions  Aquatic Therapy;Therapeutic activities;Therapeutic exercise;Balance training;Neuromuscular re-education;Patient/family education;Manual techniques;Dry needling    PT Next Visit Plan  BLE strengthening, balance training    Consulted and Agree with Plan of Care  Patient       Patient will benefit from skilled therapeutic intervention in order to improve the following deficits and impairments:  Abnormal gait, Decreased balance, Decreased mobility, Difficulty walking, Dizziness, Pain, Impaired flexibility, Decreased activity tolerance  Visit Diagnosis: Acute right-sided low back pain with sciatica, sciatica laterality unspecified  Muscle weakness (generalized)  Other abnormalities of gait and mobility     Problem List Patient Active Problem List   Diagnosis Date Noted  .  Environmental and seasonal allergies 01/15/2018  . Post-nasal drip 01/15/2018  . Restless legs 10/17/2017  . Leg pain 10/15/2017  . Normocytic anemia, not due to blood loss 09/27/2017  . Melena 09/25/2017  . Cognitive decline 08/16/2017  . Polypharmacy 07/28/2017  . Pneumonia 05/26/2017  . Pill esophagitis 05/23/2017  . Chronic kidney disease (CKD), stage I 11/15/2016  . History of fall 11/12/2016  . Osteoarthritis 11/10/2016  . Right arm pain 10/15/2016  . Cervical stenosis of spinal canal 10/10/2016  . Cervical radiculopathy at C7 07/02/2016  . Acute midline thoracic back pain 07/02/2016  . Epigastric hernia 04/01/2016  . Umbilical pain 53/29/9242  . CAD in native artery   . Type 2 DM with CKD stage 3 and hypertension ([place])   . Chronic renal insufficiency   . Hypertensive heart disease   . OSA on CPAP 07/07/2015  . Hospital discharge follow-up 07/07/2015  . Chest pain 06/29/2015  . Pleural effusion, right   . Chronic diastolic CHF (congestive heart failure) ([place]) 04/18/2015  . Diabetic neuropathy ([place]) 02/21/2015  . Benign neoplasm of sigmoid colon   . Low back pain with radiation 04/04/2014  . Essential hypertension 04/04/2014  . Hyperlipidemia 02/23/2014  . Long-term use of high-risk medication 02/23/2014  . Other long term (current) drug therapy 02/23/2014  . Dermatophytic onychia 08/17/2013  . Stable angina ([place]) 08/17/2013  . Cerebral infarct ([place]) 08/17/2013  . Bony exostosis 08/17/2013  . Obesity, unspecified 08/17/2013  . Arthritis 08/05/2013  . Unsteadiness on feet 03/24/2013  . History of colonic polyps 02/20/2013  . Sarcoidosis ([place]) 12/07/2012  . Dysphagia 12/07/2012  . Pulmonary nodule seen on imaging  study 05/20/2012  . 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 Diabetic nephropathy associated with type 2 diabetes mellitus ([place]) 02/29/2012  . Cardiomyopathy, ischemic   . Hepatic steatosis   . Osteoporosis, post-menopausal   . Peripheral vascular disease  due to secondary diabetes mellitus ([place])   . Double vessel coronary artery disease 12/14/2011    Alanson Puls, PT DPT 02/03/2018, 2:34 PM  [place] MAIN [facility] SERVICES [address] [place], [place], [address] Phone: [phone]   Fax:  [phone]  Name: JAMMY STLOUIS MRN: [id number] Date of Birth: [birth year]

## 2018-02-03 NOTE — Addendum Note (Signed)
Addended by: Alanson Puls on: 02/03/2018 02:41 PM   Modules accepted: Orders

## 2018-02-04 DIAGNOSIS — I129 Hypertensive chronic kidney disease with stage 1 through stage 4 chronic kidney disease, or unspecified chronic kidney disease: Secondary | ICD-10-CM | POA: Diagnosis not present

## 2018-02-04 DIAGNOSIS — R809 Proteinuria, unspecified: Secondary | ICD-10-CM | POA: Diagnosis not present

## 2018-02-04 DIAGNOSIS — N183 Chronic kidney disease, stage 3 (moderate): Secondary | ICD-10-CM | POA: Diagnosis not present

## 2018-02-04 DIAGNOSIS — E1122 Type 2 diabetes mellitus with diabetic chronic kidney disease: Secondary | ICD-10-CM | POA: Diagnosis not present

## 2018-02-04 DIAGNOSIS — D631 Anemia in chronic kidney disease: Secondary | ICD-10-CM | POA: Diagnosis not present

## 2018-02-09 ENCOUNTER — Ambulatory Visit: Payer: Medicare Other | Attending: Internal Medicine | Admitting: Physical Therapy

## 2018-02-09 ENCOUNTER — Encounter: Payer: Self-pay | Admitting: Physical Therapy

## 2018-02-09 DIAGNOSIS — R2689 Other abnormalities of gait and mobility: Secondary | ICD-10-CM | POA: Insufficient documentation

## 2018-02-09 DIAGNOSIS — M5440 Lumbago with sciatica, unspecified side: Secondary | ICD-10-CM | POA: Diagnosis not present

## 2018-02-09 DIAGNOSIS — M6281 Muscle weakness (generalized): Secondary | ICD-10-CM

## 2018-02-09 NOTE — Therapy (Signed)
[place] MAIN [facility] SERVICES [address] [place], [place], [address] Phone: [phone]   Fax:  [phone]  Physical Therapy Treatment  Patient Details  [name] MRN: [id number] Date of Birth: Apr 04, 1941 Referring Provider (PT): [name]    Encounter Date: 02/09/2018  PT End of Session - 02/09/18 1307    Visit Number  13    Number of Visits  17    Date for PT Re-Evaluation  03/31/18    PT Start Time  0102    PT Stop Time  0140    PT Time Calculation (min)  38 min    Equipment Utilized During Treatment  Gait belt    Activity Tolerance  Patient tolerated treatment well    Behavior During Therapy  WFL for tasks assessed/performed       Past Medical History:  Diagnosis Date  . Acute respiratory failure ([place]) 11/21/2014  . Anxiety   . Arthritis 08/05/2013  . Atherosclerotic peripheral vascular disease with gangrene [facility]) august 2012  . Cardiomyopathy, ischemic    a. EF 35 to 40% by echo in 2013 b. EF improved to 50-55% by echo in 04/2015; 11/2016 Echo: EF 55-60%, Gr1 DD, mildly dil LA, nl RV fxn.  . Cerebral infarct ([place]) 08/17/2013  . Chronic diastolic CHF (congestive heart failure) (HCC)    a. EF 50-55% by echo in 04/2015; b. 11/2016 Echo: EF 55-60%, Gr1 DD.  [name] COPD (chronic obstructive pulmonary disease) ([place])   . Coronary artery disease, occlusive    a. Previous PCI to the LAD, LCx, and RCA in 2010, 2011, 2013, and 2016. All performed in [place]; b. 11/2016 Lexiscan MV: EF 43%, no ischemia->low risk.  . 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 Diabetic diarrhea ([place]) 10/03/2014  . DM type 2, uncontrolled, with renal complications ([place]) 10/11/5636  . Hepatic steatosis    by CT abd pelvis  . History of kidney stones    at a younger age  . Hyperlipidemia LDL goal <100 02/23/2014  . Hypertensive heart disease   . Osteoporosis, post-menopausal   . 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 Pill esophagitis   . Pleural effusion 10/25/2012   10/2012 CT chest >> small to moderate R lung effusion>> chylothorax, 100% lymphs 10/2013 thoracentesis> cytology negative, WBC 1471, > 90% "small lymphs" 01/2014 CT chest> near complete resolution of pleural effusion, stable lymphadenopathy 08/2014 CT chest [place] ([facility]): small right sided effusion decreased in size, stable mediastinal lymphadenopathy 1.0cm largest, 93m . 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 Sleep apnea   . Stage III chronic kidney disease ([facility]     Past Surgical History:  Procedure Laterality Date  . ABDOMINAL HYSTERECTOMY     at ge 39. secondary to bleeding/partial  . ANTERIOR CERVICAL DECOMP/DISCECTOMY FUSION N/A 10/10/2016   Procedure: Anterior Cervical Discectomy Fusion - Cervical four4- five - Cervical five-Cervical six - Cervical six-Cervical seven;  Surgeon: PEarnie Larsson MD;  [place]  Service: Neurosurgery;  Laterality: N/A;  . BREAST CYST ASPIRATION Right   . CARPAL TUNNEL RELEASE Bilateral   . CHOLECYSTECTOMY  in [place]   . COLONOSCOPY WITH PROPOFOL N/A 01/09/2015   Procedure: COLONOSCOPY WITH PROPOFOL;  Surgeon: Lucilla Lame, MD;  Location: [facility] ENDOSCOPY;  Service: Endoscopy;  Laterality: N/A;  . CORONARY ANGIOPLASTY WITH STENT PLACEMENT     [place]; [facility]  . EYE SURGERY     bil cataracts  . HERNIA REPAIR     umbilical/ Dr Pat Patrick  . OOPHORECTOMY    . PTCA  August 2012   Right Posterior tibial artery , [place]  . TOE AMPUTATION  Sept 2012   Right 2nd toe, [place]    There were no vitals filed for this visit.  Subjective Assessment - 02/09/18 1306    Subjective  Patient  reports that she is walking much better and her legs are not hurting today. She is walking without her cane today. She feels worn out because her Aunt died on 2022-07-15  and she has been traveling back and forth. She needs to go up to [place] to visit her mom and will be back to therapy in October.     Pertinent History  Pamela Collier presents for evaluation of  intermittent  left sided leg pain that began without fall or injury several weeks ago.  She state that the pain started  In her left foot,  In the  Toes, and  has ascended up the lateral side of her left leg and is accompanied by stiffness of both legs.  She also describes back pain radiates into left hip and left leg.  No recent fall.       Currently in Pain?  No/denies    Pain Score  0-No pain       Treatment TM walking 1.0 miles / hour x 5 mins   Standing on airex: Trunk rotation side to side with ball BUE hold and head turning with trunk x 10 , VC for posture correction Tapping balloon to mirror  x 2 mins  feet apart , vc for safety Alternate toe taps on 4 inch step without rail assist x15 with cues to step backwards for better foot placement and avoid toe catching, 50 % UE Modified tandem stance with BUE ball up/down x10 each foot in front with CGA for safety; 50% UE  Standing on airex beam:  Tandem stance on airex beam without rail assist 10 sec hold x4 each foot in front with CGA for safety and cues to improve upper trunk control for better balance control; Side stepping down airex beam without rail assist x3 laps each direction with cues to keep feet on beam and avoid stepping off; Standing with feet apart, BUE ball toss x10 unsupported with close supervision; Patient exhibits posterior loss of balance requiring cues for forward weight shift   Standing on 1/2 bolster (flat side up) Feet flat, apart, BUE wand flexion x10 reps with min A For safety; Patient able to keep balance well with minimal posterior loss of balance; stepping over  hurdle x 10 fwd / bwd, 10 side to side with UE support, 50 %UE  leg press 90 lbs x 20  Leg press 60 lbs heel raises   Standing hip abd, hip ext with RTB x 15 x 2 BLE   Patient needs cues for posture and technique                   PT Education - 02/09/18 1307    Education Details  HEP, safety    Person(s) Educated  Patient    Methods  Explanation;Demonstration    Comprehension  Verbalized understanding;Returned demonstration;Need further instruction       PT Short Term Goals - 01/06/18 1114      PT SHORT TERM GOAL #1   Title  Patient will be independent in home exercise program to improve strength/mobility for better functional independence with ADLs.    Time  4    Period  Weeks    Status  On-going    Target Date  01/06/18      PT SHORT TERM GOAL #2   Title  Patient will complete five times sit to stand test in < 15 seconds indicating an increased LE strength and improved balance.    Baseline  01/06/18 =16.50 sec    Time  4    Period  Weeks    Status  On-going    Target Date  01/06/18        PT Long Term Goals - 02/03/18 1405      PT LONG TERM GOAL #1   Title  Patient will increase six minute walk test distance to >1000 for progression to community ambulator and improve gait ability    Baseline  01/06/18=875, 850 feet 02/03/18    Time  8    Period  Weeks    Status  Partially Met    Target Date  03/31/18      PT LONG TERM GOAL #2   Title  Patient will increase 10 meter walk test to >1.22ms as to improve gait speed for better community ambulation and to reduce fall risk.    Baseline  01/06/18  .88 m/sec;, ,761mec    Time  8    Period  Weeks    Status  Partially Met    Target Date  03/31/18      PT LONG TERM GOAL #3   Title  Patient will reduce timed up and go to <11 seconds to reduce fall risk and demonstrate improved transfer/gait ability.    Baseline  01/06/18=15.08 sec, 13.80 sec    Time  8    Period  Weeks    Status  Partially Met    Target  Date  03/31/18      PT LONG TERM GOAL #4   Title  Patient will be require no assist with ascend/descend 3 steps using Least restrictive assistive device.    Baseline  01/06/18 she needs the spc for steps    Time  8    Period  Weeks    Status  Partially Met    Target Date  03/31/18            Plan - 02/09/18 1308    Clinical Impression Statement  Instructed patient in balance and strengthening exercise. Patient requires CGA to min A with advanced balance exercise. Patient requires cues for weight shift and trunk control for better balance. Patient also instructed to slow down LE movement during strengthening exercise for better motor control. Patient reports increased fatigue at end of treatment session. Patient would benefit from additional skilled PT intervention to improve balance/gait safety and reduce fall risk.    Rehab Potential  Good    PT Frequency  2x / week    PT Duration  8 weeks    PT Treatment/Interventions  Aquatic Therapy;Therapeutic activities;Therapeutic exercise;Balance training;Neuromuscular re-education;Patient/family education;Manual techniques;Dry needling    PT Next Visit Plan  BLE strengthening, balance training    Consulted and Agree with Plan of Care  Patient  Patient will benefit from skilled therapeutic intervention in order to improve the following deficits and impairments:  Abnormal gait, Decreased balance, Decreased mobility, Difficulty walking, Dizziness, Pain, Impaired flexibility, Decreased activity tolerance  Visit Diagnosis: Acute right-sided low back pain with sciatica, sciatica laterality unspecified  Other abnormalities of gait and mobility  Muscle weakness (generalized)     Problem List Patient Active Problem List   Diagnosis Date Noted  . Environmental and seasonal allergies 01/15/2018  . Post-nasal drip 01/15/2018  . Restless legs 10/17/2017  . Leg pain 10/15/2017  . Normocytic anemia, not due to blood loss 09/27/2017  .  Melena 09/25/2017  . Cognitive decline 08/16/2017  . Polypharmacy 07/28/2017  . Pneumonia 05/26/2017  . Pill esophagitis 05/23/2017  . Chronic kidney disease (CKD), stage I 11/15/2016  . History of fall 11/12/2016  . Osteoarthritis 11/10/2016  . Right arm pain 10/15/2016  . Cervical stenosis of spinal canal 10/10/2016  . Cervical radiculopathy at C7 07/02/2016  . Acute midline thoracic back pain 07/02/2016  . Epigastric hernia 04/01/2016  . Umbilical pain 53/61/4431  . CAD in native artery   . Type 2 DM with CKD stage 3 and hypertension ([place])   . Chronic renal insufficiency   . Hypertensive heart disease   . OSA on CPAP 07/07/2015  . Hospital discharge follow-up 07/07/2015  . Chest pain 06/29/2015  . Pleural effusion, right   . Chronic diastolic CHF (congestive heart failure) ([place]) 04/18/2015  . Diabetic neuropathy ([place]) 02/21/2015  . Benign neoplasm of sigmoid colon   . Low back pain with radiation 04/04/2014  . Essential hypertension 04/04/2014  . Hyperlipidemia 02/23/2014  . Long-term use of high-risk medication 02/23/2014  . Other long term (current) drug therapy 02/23/2014  . Dermatophytic onychia 08/17/2013  . Stable angina ([place]) 08/17/2013  . Cerebral infarct ([employer]) 08/17/2013  . Bony exostosis 08/17/2013  . Obesity, unspecified 08/17/2013  . Arthritis 08/05/2013  . Unsteadiness on feet 03/24/2013  . History of colonic polyps 02/20/2013  . Sarcoidosis ([place]) 12/07/2012  . Dysphagia 12/07/2012  . Pulmonary nodule seen on imaging study 05/20/2012  . 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 Diabetic nephropathy associated with type 2 diabetes mellitus ([place]) 02/29/2012  . Cardiomyopathy, ischemic   . Hepatic steatosis   . Osteoporosis, post-menopausal   . Peripheral vascular disease due to secondary diabetes mellitus ([place])   . Double vessel coronary artery disease 12/14/2011    Alanson Puls, PT DPT 02/09/2018, 1:09 PM  [place] MAIN [facility] SERVICES [address] [place], [place], [address] Phone: [phone]   Fax:  [phone]  [name] MRN: [id number] Date of Birth: 07/19/1940

## 2018-02-10 ENCOUNTER — Encounter: Payer: Self-pay | Admitting: Internal Medicine

## 2018-02-10 ENCOUNTER — Ambulatory Visit (INDEPENDENT_AMBULATORY_CARE_PROVIDER_SITE_OTHER): Payer: Medicare Other

## 2018-02-10 ENCOUNTER — Telehealth: Payer: Self-pay

## 2018-02-10 ENCOUNTER — Ambulatory Visit (INDEPENDENT_AMBULATORY_CARE_PROVIDER_SITE_OTHER): Payer: Medicare Other | Admitting: Internal Medicine

## 2018-02-10 VITALS — BP 122/56 | HR 63 | Temp 98.4°F | Resp 15 | Ht 67.0 in | Wt 185.6 lb

## 2018-02-10 DIAGNOSIS — N181 Chronic kidney disease, stage 1: Secondary | ICD-10-CM

## 2018-02-10 DIAGNOSIS — I129 Hypertensive chronic kidney disease with stage 1 through stage 4 chronic kidney disease, or unspecified chronic kidney disease: Secondary | ICD-10-CM | POA: Diagnosis not present

## 2018-02-10 DIAGNOSIS — Z23 Encounter for immunization: Secondary | ICD-10-CM

## 2018-02-10 DIAGNOSIS — N183 Chronic kidney disease, stage 3 (moderate): Secondary | ICD-10-CM

## 2018-02-10 DIAGNOSIS — R2681 Unsteadiness on feet: Secondary | ICD-10-CM | POA: Diagnosis not present

## 2018-02-10 DIAGNOSIS — J189 Pneumonia, unspecified organism: Secondary | ICD-10-CM

## 2018-02-10 DIAGNOSIS — I255 Ischemic cardiomyopathy: Secondary | ICD-10-CM

## 2018-02-10 DIAGNOSIS — E1121 Type 2 diabetes mellitus with diabetic nephropathy: Secondary | ICD-10-CM | POA: Diagnosis not present

## 2018-02-10 DIAGNOSIS — R4189 Other symptoms and signs involving cognitive functions and awareness: Secondary | ICD-10-CM

## 2018-02-10 DIAGNOSIS — E1122 Type 2 diabetes mellitus with diabetic chronic kidney disease: Secondary | ICD-10-CM | POA: Diagnosis not present

## 2018-02-10 DIAGNOSIS — N184 Chronic kidney disease, stage 4 (severe): Secondary | ICD-10-CM | POA: Diagnosis not present

## 2018-02-10 DIAGNOSIS — D869 Sarcoidosis, unspecified: Secondary | ICD-10-CM

## 2018-02-10 DIAGNOSIS — I5032 Chronic diastolic (congestive) heart failure: Secondary | ICD-10-CM | POA: Diagnosis not present

## 2018-02-10 LAB — RENAL FUNCTION PANEL
ALBUMIN: 3.6 g/dL (ref 3.5–5.2)
BUN: 38 mg/dL — ABNORMAL HIGH (ref 6–23)
CHLORIDE: 104 meq/L (ref 96–112)
CO2: 25 mEq/L (ref 19–32)
Calcium: 10.7 mg/dL — ABNORMAL HIGH (ref 8.4–10.5)
Creatinine, Ser: 2.12 mg/dL — ABNORMAL HIGH (ref 0.40–1.20)
GFR: 28.99 mL/min — AB (ref >60.00)
Glucose, Bld: 64 mg/dL — ABNORMAL LOW (ref 70–99)
POTASSIUM: 4.2 meq/L (ref 3.5–5.1)
Phosphorus: 3.5 mg/dL (ref 2.3–4.6)
SODIUM: 136 meq/L (ref 135–145)

## 2018-02-10 LAB — HEMOGLOBIN A1C: HEMOGLOBIN A1C: 7.7 % — AB (ref 4.6–6.5)

## 2018-02-10 IMAGING — CT CT ABD-PELV W/O CM
2 of 4 series · 14 of 46 positions shown, 16 images · non-contrast
Comparison: CT the abdomen and pelvis 11/02/2014.

CLINICAL DATA: 74-year-old female with history of left-sided flank
pain for the past 2 months. History of gallstones.

EXAM:
CT ABDOMEN AND PELVIS WITHOUT CONTRAST
TECHNIQUE: Multidetector CT imaging of the abdomen and pelvis was performed
following the standard protocol without IV contrast.

[Series 2: routine abd pel wo · axial · 0.86mm/px · z∈[-916,-460]mm · 11 of 107 slices shown, 13 images]
[im 8/107  soft-tissue]
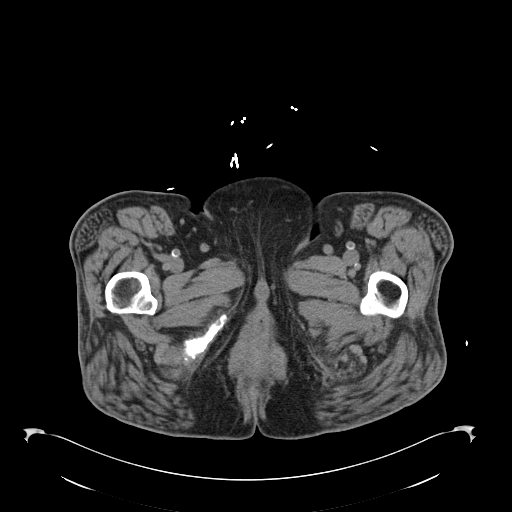
[im 8/107  bone]
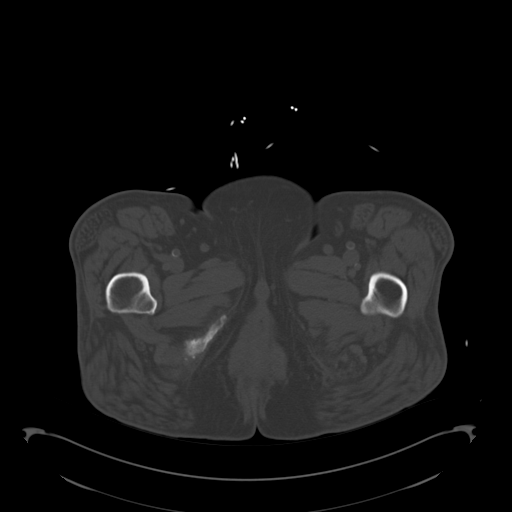
[im 16/107  soft-tissue]
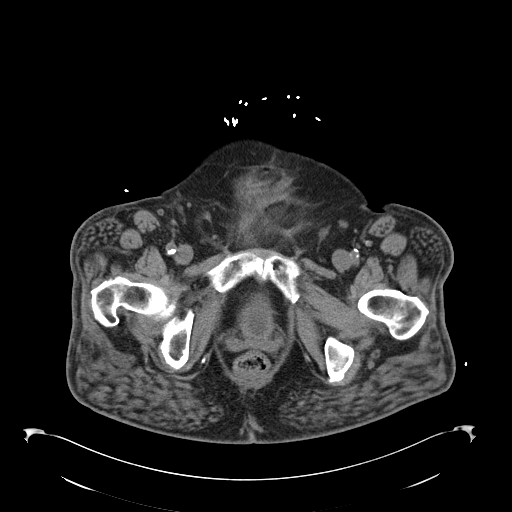
[im 24/107  soft-tissue]
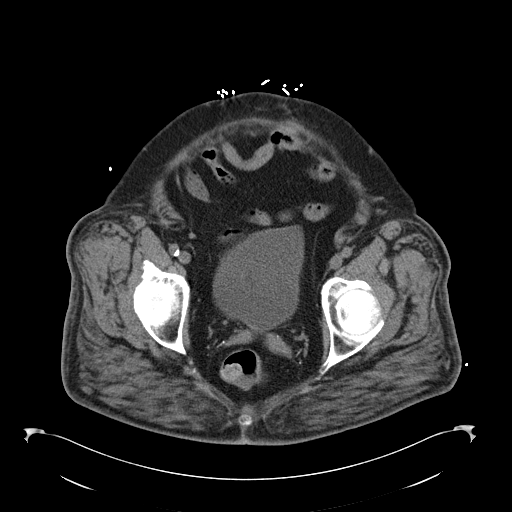
[im 36/107  soft-tissue]
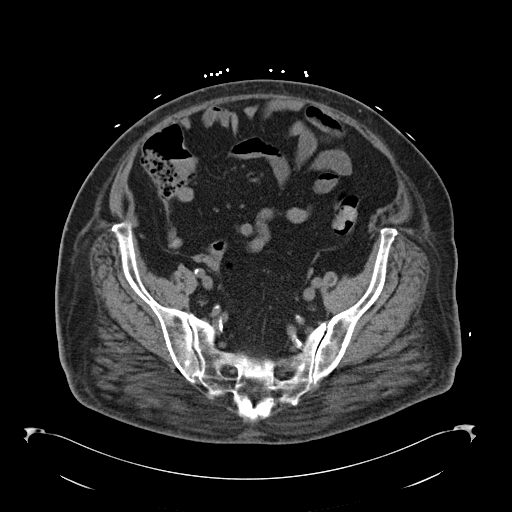
[im 44/107  soft-tissue]
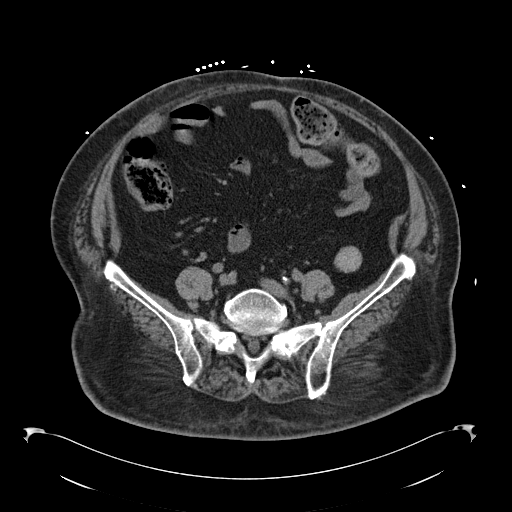
[im 55/107  soft-tissue]
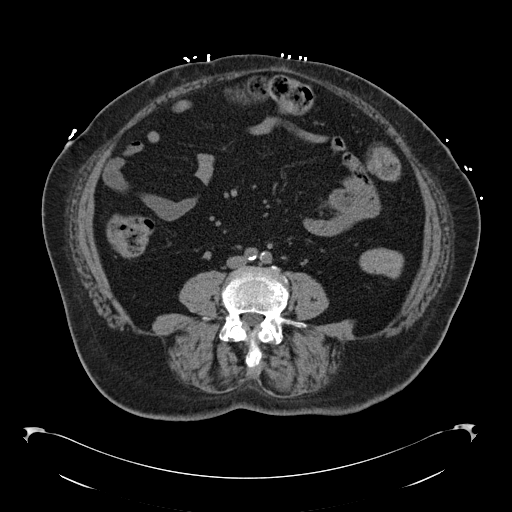
[im 63/107  soft-tissue]
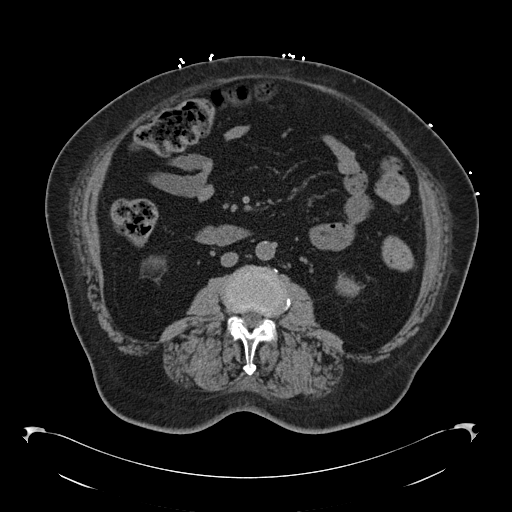
[im 71/107  soft-tissue]
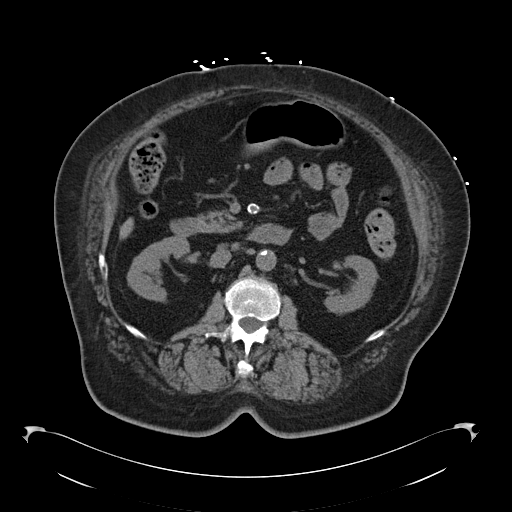
[im 83/107  soft-tissue]
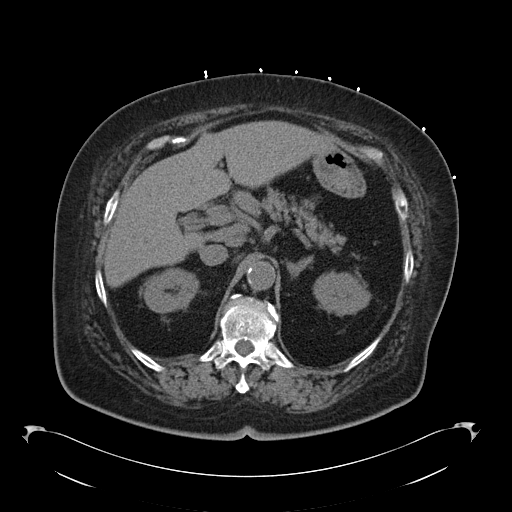
[im 83/107  bone]
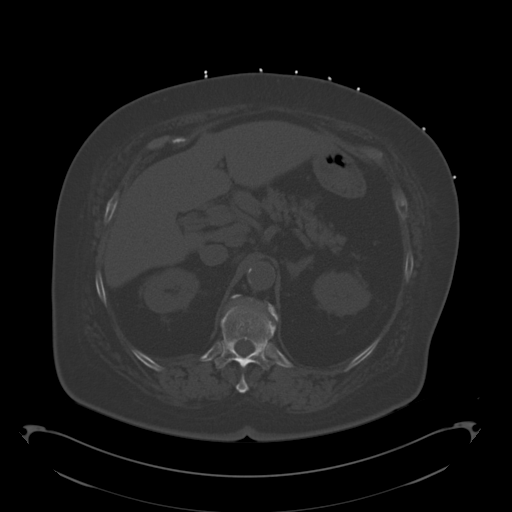
[im 91/107  soft-tissue]
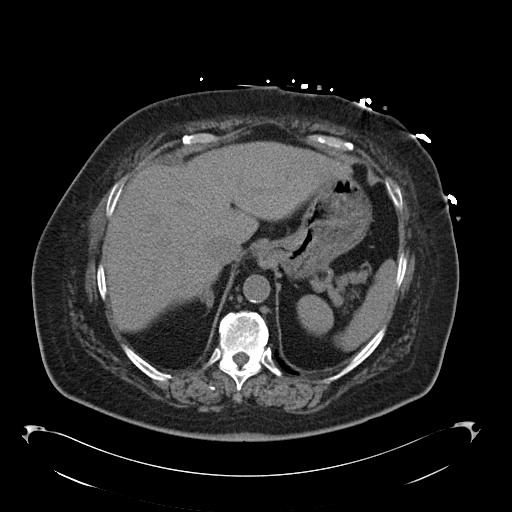
[im 99/107  soft-tissue]
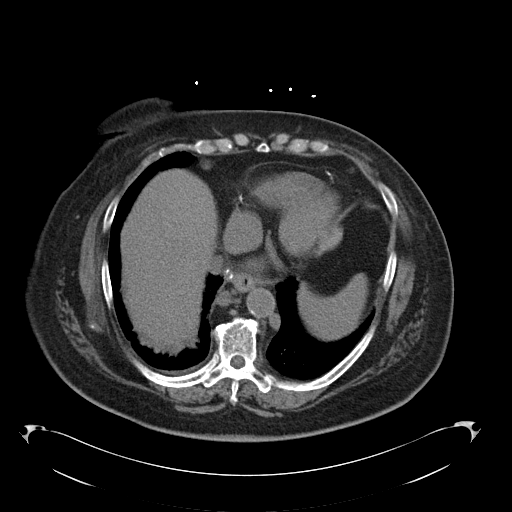

[Series 5: cor routine abd pel wo · coronal · 0.72mm/px · 3 of 166 slices shown]
[im 56/166  soft-tissue]
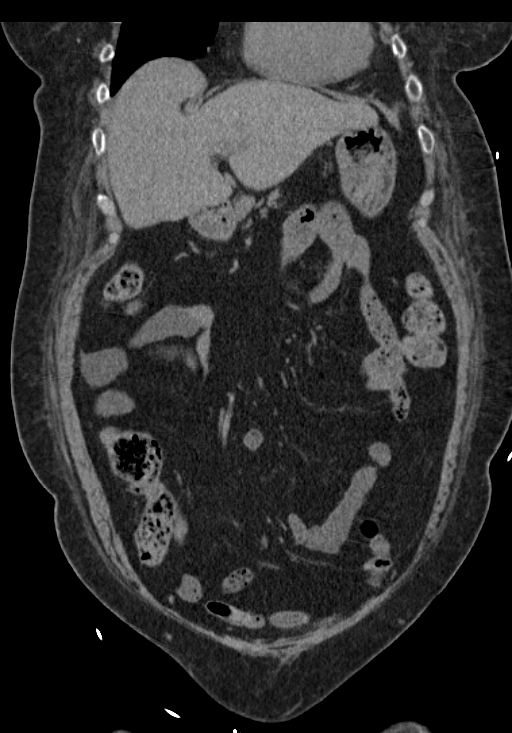
[im 74/166  soft-tissue]
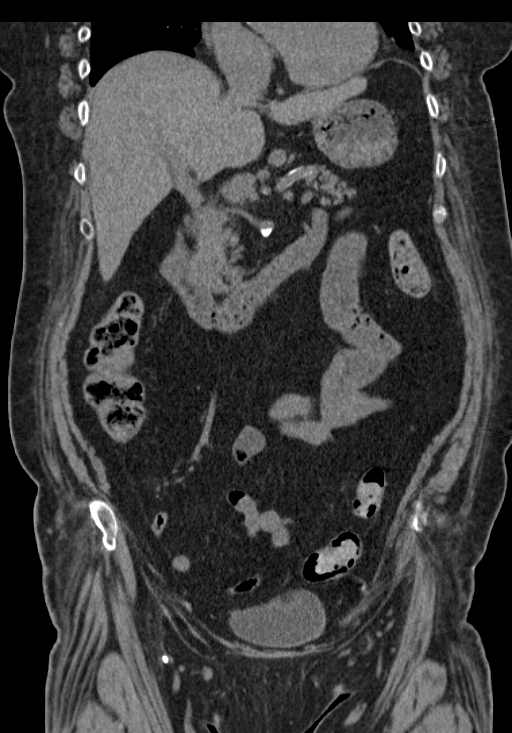
[im 92/166  soft-tissue]
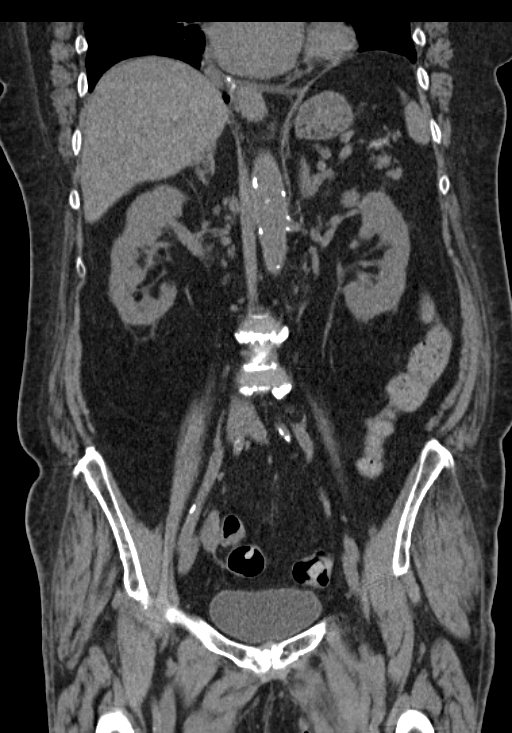

[14 of 46 positions shown; findings below may reference images not displayed]

FINDINGS: Lower chest: Extensive bronchial wall thickening in the lower lobes
of the lungs bilaterally. Multiple enlarged middle mediastinal lymph
nodes adjacent to the distal esophagus measuring up to 11 mm in
short axis. Calcified mediastinal and right hilar lymph nodes are
also noted. Mildly enlarged juxtaphrenic lymph nodes measuring up to
11 mm on the right side. These lymph nodes all appear relatively
unchanged compared to prior study 11/02/2014, and are therefore
favored to be benign. Atherosclerotic calcifications in the left
anterior descending, left circumflex and right coronary arteries.
Calcifications of the mitral annulus. Small hiatal hernia.

Hepatobiliary: No definite cystic or solid hepatic lesions
identified on today's noncontrast CT examination. Gallbladder is not
visualized, likely surgically absent (no surgical clips are noted in
the gallbladder fossa).

Pancreas: No definite pancreatic mass or peripancreatic inflammatory
changes on today's noncontrast CT examination.

Spleen: Unremarkable.

Adrenals/Urinary Tract: Thickening of the adrenal glands
bilaterally, similar to the prior study. Bilateral perinephric
stranding (nonspecific). There are no abnormal calcifications within
the collecting system of either kidney, along the course of either
ureter, or within the lumen of the urinary bladder. No
hydroureteronephrosis or perinephric stranding to suggest urinary
tract obstruction at this time. The unenhanced appearance of the
kidneys is unremarkable bilaterally. Unenhanced appearance of the
urinary bladder is unremarkable.

Stomach/Bowel: Unenhanced appearance of the stomach is normal. There
is no pathologic dilatation of small bowel or colon. Appendicolith
inside the appendix. No signs of appendiceal inflammation.

Vascular/Lymphatic: Atherosclerotic calcifications throughout the
abdominal and pelvic vasculature, without evidence of aneurysm. No
lymphadenopathy noted in the abdomen or pelvis.

Reproductive: Status post hysterectomy. Ovaries are not confidently
identified and may be surgically absent or atrophic.

Other: No significant volume of ascites.  No pneumoperitoneum.

Musculoskeletal: There are no aggressive appearing lytic or blastic
lesions noted in the visualized portions of the skeleton.
IMPRESSION: 1. No acute findings in the abdomen or pelvis to account for the
patient's symptoms. Specifically, no urinary tract calculi no
findings of urinary tract obstruction are noted at this time.
2. There is an appendicolith in the appendix, but the appendix does
not appear inflamed. This is similar to the prior study.
3. Extensive atherosclerosis, including at least 3 vessel coronary
artery disease. Assessment for potential risk factor modification,
dietary therapy or pharmacologic therapy may be warranted, if
clinically indicated.
4. Nonspecific mildly enlarged lymph nodes throughout the a lower
visualized mediastinum, similar to the prior study, presumably
benign. Given the presence of numerous calcified lymph nodes, this
may reflect sequela of old granulomatous disease or sarcoidosis.
5. Additional incidental findings, as above.

## 2018-02-10 MED ORDER — DONEPEZIL HCL 5 MG PO TABS: 5.0000 mg | ORAL_TABLET | Freq: Every day | ORAL | 1 refills | Status: DC

## 2018-02-10 NOTE — Patient Instructions (Addendum)
DON 't take your INSULIN until you are sitting down to eat    [place] TO  YOUR HOME AND LET ME KNOW  SO I CAN CONTINUE HAVING HER COME OUT

## 2018-02-10 NOTE — Progress Notes (Signed)
Subjective:  Patient ID: Pamela Collier, female    DOB: 10/29/1940  Age: 77 y.o. MRN: [id number]  CC: The primary encounter diagnosis was Chronic diastolic CHF (congestive heart failure) ([place]). 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  HPI Pamela Collier presents for follow up on multiple issues.  polypharmacy: remains confused about meds but using a pill box that is filled weekly  .  Has  A monthly (?) visit by a  Home Health RN  From an undetermined agency .  patient declined [place] services after last visit. She is filling her own pill box   She is receving outpatient PT for leg weakness,  Thinks it is helping.  Getting it 2 times per week.  Doing it on her own 2 times per week.  Saw Manuella Ghazi in April for MCI, tremor and headache .[name] Diagnosed with Alzheimers Dementia.   MMSE was 18/24  Aricept was prescribed.  Has been taking it daily at night,  Does not see any improvement in her memory . Expectations discussed  No side effects.  Having diarrhea once or twice a month with fecal urgency resulting in embarassing accidents.  Discussed immodium  Type 2 DM:  Checking bid  .  Today's fasting was 71.   Having recurrent hypoglycemia caused by dealyed meals after taking her insulin Did not bring glucometer or log.  Was treated FOR PNEUMONIA  On sept 6 after presenting with cough.  Interstitial infiltrate noted,  Due for  follow up chest xray.  Still taking claritin  And guaifenesin     Lab Results  Component Value Date   HGBA1C 7.7 (H) 02/10/2018         Outpatient  Medications Prior to Visit  Medication Sig Dispense Refill  . aspirin 81 MG chewable tablet Chew 81 mg by mouth daily.     . B-D UF III MINI PEN NEEDLES 31G X 5 MM MISC USE THREE TIMES A DAY 270 each 3  . BRILINTA 90 MG TABS tablet TAKE 1 TABLET TWICE A DAY 180 tablet 1  . docusate sodium (COLACE) 100 MG capsule Take 100 mg by mouth daily as needed for mild constipation. Reported on 11/09/2015    . feeding supplement, GLUCERNA SHAKE, (GLUCERNA SHAKE) LIQD Take 237 mLs by mouth 2 (two) times daily between meals. 60 Can 0  . fluticasone (FLONASE) 50 MCG/ACT nasal spray Place 2 sprays into both nostrils daily as needed for rhinitis. 16 g 5  . furosemide (LASIX) 20 MG tablet Take 1 tablet (20 mg total) by mouth every other day. 30 tablet 0  . glucose blood (ACCU-CHEK AVIVA PLUS) test strip USE TO CHECK GLUCOSE THREE TIMES DAILY 200 each 5  . guaiFENesin (MUCINEX) 600 MG 12 hr tablet Take 1 tablet (600 mg total) by mouth 2 (two) times daily. 30 tablet 0  . Insulin Lispro Prot & Lispro (HUMALOG MIX 75/25 KWIKPEN) (75-25) 100 UNIT/ML Kwikpen INJECT 45 TO 60 UNITS TWICE DAILY BEFORE MORNING AND EVENING MEALS 105 mL 4  . isosorbide mononitrate (IMDUR) 30 MG 24 hr tablet TAKE 1 TABLET DAILY 90 tablet 1  . loratadine (CLARITIN) 10 MG  tablet Take 1 tablet (10 mg total) by mouth daily. 30 tablet 2  . losartan (COZAAR) 100 MG tablet TAKE 1 TABLET DAILY 90 tablet 1  . metoprolol succinate (TOPROL-XL) 25 MG 24 hr tablet TAKE 1 TABLET DAILY 90 tablet 1  . mometasone-formoterol (DULERA) 200-5 MCG/ACT AERO Inhale 2 puffs into the lungs 2 (two) times daily. 13 g 3  . nitroGLYCERIN (NITROLINGUAL) 0.4 MG/SPRAY spray Place 1 spray under the tongue every 5 (five) minutes x 3 doses as needed for chest pain. 12 g 12  . pregabalin (LYRICA) 100 MG capsule TAKE 1 CAPSULE BY MOUTH THREE TIMES DAILY 90 capsule 5  . rosuvastatin (CRESTOR) 20 MG tablet TAKE ONE TABLET BY MOUTH ONCE DAILY 90 tablet 1  . donepezil (ARICEPT) 5 MG  tablet Take 5 mg by mouth at bedtime.    [name] acetaminophen (TYLENOL) 500 MG tablet Take 1,000 mg by mouth every 6 (six) hours as needed for mild pain or headache.     . ALPRAZolam (XANAX) 0.25 MG tablet TAKE 1 TABLET BY MOUTH ONCE DAILY (Patient not taking: Reported on 02/10/2018) 30 tablet 2  . budesonide-formoterol (SYMBICORT) 160-4.5 MCG/ACT inhaler Inhale 2 puffs into the lungs 2 (two) times daily. (Patient not taking: Reported on 02/10/2018) 1 Inhaler 0  . budesonide-formoterol (SYMBICORT) 160-4.5 MCG/ACT inhaler Inhale 2 puffs into the lungs 2 (two) times daily. (Patient not taking: Reported on 02/10/2018) 1 Inhaler 6  . levofloxacin (LEVAQUIN) 500 MG tablet Take 1 tablet (500 mg total) by mouth daily. (Patient not taking: Reported on 02/10/2018) 7 tablet 0  . omeprazole (PRILOSEC) 40 MG capsule Take 1 capsule (40 mg total) by mouth daily. (Patient not taking: Reported on 02/10/2018) 30 capsule 3  . pantoprazole (PROTONIX) 40 MG tablet Take 40 mg by mouth daily. Patient reports taking once daily    . rOPINIRole (REQUIP) 0.25 MG tablet Take 1 tablet (0.25 mg total) by mouth at bedtime. Increase weekly as needed for restless legs (Patient not taking: Reported on 02/10/2018) 90 tablet 0   Facility-Administered Medications Prior to Visit  Medication Dose Route Frequency Provider Last Rate Last Dose  . albuterol (PROVENTIL) (5 MG/ML) 0.5% nebulizer solution 2.5 mg  2.5 mg Nebulization Once [name], MD        Review of Systems;  Patient denies headache, fevers, malaise, unintentional weight loss, skin rash, eye pain, sinus congestion and sinus pain, sore throat, dysphagia,  hemoptysis , cough, dyspnea, wheezing, chest pain, palpitations, orthopnea, edema, abdominal pain, nausea, melena, diarrhea, constipation, flank pain, dysuria, hematuria, urinary  Frequency, nocturia, numbness, tingling, seizures,  Focal weakness, Loss of consciousness,  Tremor, insomnia, depression, anxiety, and suicidal  ideation.      Objective:  BP (!) 122/56 (BP Location: Left Arm, Patient Position: Sitting, Cuff Size: Normal)   Pulse 63   Temp 98.4 F (36.9 C) (Oral)   Resp 15   Ht 5\' 7"  (1.702 m)   Wt 185 lb 9.6 oz (84.2 kg)   SpO2 97%   BMI 29.07 kg/m   BP Readings from Last 3 Encounters:  02/10/18 (!) 122/56  01/14/18 (!) 100/44  12/23/17 (!) 115/36    Wt Readings from Last 3 Encounters:  02/10/18 185 lb 9.6 oz (84.2 kg)  01/14/18 186 lb 6.4 oz (84.6 kg)  12/09/17 182 lb (82.6 kg)    General appearance: alert, cooperative and appears stated age Ears: normal TM's and external ear canals both ears Throat: lips, mucosa, and tongue normal; teeth and gums  normal Neck: no adenopathy, no carotid bruit, supple, symmetrical, trachea midline and thyroid not enlarged, symmetric, no tenderness/mass/nodules Back: symmetric, no curvature. ROM normal. No CVA tenderness. Lungs: clear to auscultation bilaterally Heart: regular rate and rhythm, S1, S2 normal, no murmur, click, rub or gallop Abdomen: soft, non-tender; bowel sounds normal; no masses,  no organomegaly Pulses: 2+ and symmetric Skin: Skin color, texture, turgor normal. No rashes or lesions Lymph nodes: Cervical, supraclavicular, and axillary nodes normal.  Lab Results  Component Value Date   HGBA1C 7.7 (H) 02/10/2018   HGBA1C 7.7 (H) 09/25/2017   HGBA1C 7.5 (H) 06/08/2017    Lab Results  Component Value Date   CREATININE 2.12 (H) 02/10/2018   CREATININE 1.56 (H) 09/25/2017   CREATININE 1.48 (H) 06/08/2017    Lab Results  Component Value Date   WBC 6.9 09/25/2017   HGB 11.2 (L) 09/25/2017   HCT 33.6 (L) 09/25/2017   PLT 290.0 09/25/2017   GLUCOSE 64 (L) 02/10/2018   CHOL 124 11/15/2016   TRIG 175 (H) 11/15/2016   HDL 29 (L) 11/15/2016   LDLDIRECT 102.0 03/14/2016   LDLCALC 60 11/15/2016   ALT 13 09/25/2017   AST 19 09/25/2017   NA 136 02/10/2018   K 4.2 02/10/2018   CL 104 02/10/2018   CREATININE 2.12 (H)  02/10/2018   BUN 38 (H) 02/10/2018   CO2 25 02/10/2018   TSH 0.953 11/15/2016   INR 1.2 (H) 02/02/2015   HGBA1C 7.7 (H) 02/10/2018   MICROALBUR 28.9 (H) 09/26/2015    Dg Bone Density  Result Date: 11/02/2017 EXAM: DUAL X-RAY ABSORPTIOMETRY (DXA) FOR BONE MINERAL DENSITY IMPRESSION: Dear Dr Derrel Nip, Your patient Yolander Goodie completed a BMD test on 11/02/2017 using the [facility] (analysis version: 14.10) manufactured by [employer]. The following summarizes the results of our evaluation. PATIENT BIOGRAPHICAL: Name: Gianella, Chismar Patient ID: [id number] Birth Date: 10/29/1940 Height: 65.0 in. Gender: Female Exam Date: 11/02/2017 Weight: 186.0 lbs. Indications: Advanced Age, History of Spinal Surgery, Hysterectomy, Oophorectomy Bilateral, Osteopenia, Postmenopausal, History of Fracture (Adult) Fractures: Ankle Treatments: ASPRIN 81 MG, Flonase, Prilosec ASSESSMENT: The BMD measured at Femur Neck Right is 0.822 g/cm2 with a T-score of -1.6. This patient is considered osteopenic according to [facility]) criteria. Site Region Measured Measured WHO Young Adult BMD Date       Age      Classification T-score AP Spine L1-L4 11/02/2017 77.5 Normal 0.5 1.251 g/cm2 DualFemur Neck Right 11/02/2017 77.5 Osteopenia -1.6 0.822 g/cm2 DualFemur Total Mean 11/02/2017 77.5 Normal -0.6 0.933 g/cm2 World Health Organization [facility]) criteria for post-menopausal, Caucasian Women: Normal:       T-score at or above -1 SD Osteopenia:   T-score between -1 and -2.5 SD Osteoporosis: T-score at or below -2.5 SD RECOMMENDATIONS: 1. All patients should optimize calcium and vitamin D intake. 2. Consider FDA-approved medical therapies in postmenopausal women and men aged 79 years and older, based on the following: a. A hip or vertebral(clinical or morphometric) fracture b. T-score < -2.5 at the femoral neck or spine after appropriate evaluation to exclude secondary causes c. Low bone mass (T-score between  -1.0 and -2.5 at the femoral neck or spine) and a 10-year probability of a hip fracture > 3% or a 10-year probability of a major osteoporosis-related fracture > 20% based on the [place]-adapted WHO algorithm d. Clinician judgment and/or patient preferences may indicate treatment for people with 10-year fracture probabilities above or below these levels FOLLOW-UP: Patients with diagnosis  of osteoporosis or at high risk for fracture should have regular bone mineral density tests. For patients eligible for Medicare, routine testing is allowed once every 2 years. The testing frequency can be increased to one year for patients who have rapidly progressing disease, those who are receiving or discontinuing medical therapy to restore bone mass, or have additional risk factors. FRAX* RESULTS:  (version: 3.5) 10-year Probability of Fracture1 Major Osteoporotic Fracture2 Hip Fracture 8.3% 1.6% Population: [place] (Black) Risk Factors: History of Fracture (Adult) Based on Femur (Right) Neck BMD 1 -The 10-year probability of fracture may be lower than reported if the patient has received treatment. 2 -Major Osteoporotic Fracture: Clinical Spine, Forearm, Hip or Shoulder *FRAX is a Materials engineer of the [employer] for Metabolic Bone Disease, a [place] (WHO) [facility]. ASSESSMENT: The probability of a major osteoporotic fracture is 8.3 % within the next ten years. The probability of a hip fracture is 1.6 % within the next ten years. Electronically Signed   By: Earle Gell M.D.   On: 11/02/2017 14:18    Assessment & Plan:   Problem List Items Addressed This Visit    Chronic diastolic CHF (congestive heart failure) (HCC) - Primary   CKD (chronic kidney disease) stage 4, GFR 15-29 ml/min ([place])    She has had recent deterioration in GFR based on today's labs.  GFR is now 29 and she has hypercalcemia.  ionzied calcium PTH<  And PTH rp ordered       Cognitive decline     Secondary to Alzheimer Dementia per neurology evaluation Manuella Ghazi)..  Encouraged to continue aricept  since she denies any S/E.       Diabetic nephropathy associated with type 2 diabetes mellitus (HCC)    Continue losartan . Her proteinuria is monitored by her neprhologist   Lab Results  Component Value Date   CREATININE 2.12 (H) 02/10/2018   Lab Results  Component Value Date   MICROALBUR 28.9 (H) 09/26/2015         Pneumonia   Relevant Orders   DG Chest 2 View (Completed)   Sarcoidosis ([place])    Given her pulmonary nterstitial infiltrate and hypercalcemia on today's profile , sI am concerned that she may be having a recurrence.  Will involve her pulmonlogist and nephrologist       Type 2 DM with CKD stage 3 and hypertension ([place])    suboptimal control complicated by patient's recurrent hypoglycemia caused by delaying of meals and lack of understanding about the rapid effect of mealtime mixed insulin.  She will return with a log of blood sugars so that her insulin can be adjusted safely.  Lab Results  Component Value Date   HGBA1C 7.7 (H) 02/10/2018         Relevant Orders   Hemoglobin A1c (Completed)   Unsteadiness on feet    Improving with outpatient PT       Other Visit Diagnoses    Encounter for immunization       Relevant Orders   Flu vaccine HIGH DOSE PF (Completed)   Hypercalcemia       Relevant Orders   Calcium, ionized   PTH, intact (no Ca)   PTH-Related Peptide    A total of 40 minutes was spent with patient more than half of which was spent in counseling patient on the above mentioned issues , reviewing and explaining recent labs and imaging studies done, and coordination of care.  I have discontinued  [employer]'s budesonide-formoterol, omeprazole, pantoprazole, rOPINIRole, levofloxacin, and budesonide-formoterol. I have also changed her donepezil. Additionally, I am having her maintain her acetaminophen, docusate sodium, nitroGLYCERIN, B-D UF III  MINI PEN NEEDLES, feeding supplement (GLUCERNA SHAKE), furosemide, rosuvastatin, ALPRAZolam, aspirin, isosorbide mononitrate, glucose blood, metoprolol succinate, BRILINTA, loratadine, guaiFENesin, pregabalin, fluticasone, losartan, Insulin Lispro Prot & Lispro, and mometasone-formoterol.  Meds ordered this encounter  Medications  . donepezil (ARICEPT) 5 MG tablet    Sig: Take 1 tablet (5 mg total) by mouth at bedtime.    Dispense:  90 tablet    Refill:  1    Medications Discontinued During This Encounter  Medication Reason  . budesonide-formoterol (SYMBICORT) 160-4.5 MCG/ACT inhaler Patient has not taken in last 30 days  . budesonide-formoterol (SYMBICORT) 160-4.5 MCG/ACT inhaler Patient has not taken in last 30 days  . levofloxacin (LEVAQUIN) 500 MG tablet Completed Course  . omeprazole (PRILOSEC) 40 MG capsule Patient has not taken in last 30 days  . pantoprazole (PROTONIX) 40 MG tablet Patient has not taken in last 30 days  . rOPINIRole (REQUIP) 0.25 MG tablet Discontinued by provider  . donepezil (ARICEPT) 5 MG tablet Reorder    Follow-up: No follow-ups on file.   [name], MD

## 2018-02-10 NOTE — Telephone Encounter (Signed)
Copied from [place] [id number]. Topic: General - Other >> Feb 10, 2018  3:55 PM Keene Breath wrote: Reason for CRM: Patient called to say that Dr. Derrel Nip asked her to call back and leave the name of the nurse and agency that saw the patient:  Nurse is Jonne Ply, Agency is [facility].  Any other questions, please call patient at [phone].

## 2018-02-11 ENCOUNTER — Ambulatory Visit: Payer: Medicare Other | Admitting: Physical Therapy

## 2018-02-11 DIAGNOSIS — M6281 Muscle weakness (generalized): Secondary | ICD-10-CM

## 2018-02-11 DIAGNOSIS — M5440 Lumbago with sciatica, unspecified side: Secondary | ICD-10-CM

## 2018-02-11 DIAGNOSIS — R2689 Other abnormalities of gait and mobility: Secondary | ICD-10-CM | POA: Diagnosis not present

## 2018-02-11 NOTE — Assessment & Plan Note (Signed)
Improving with outpatient PT

## 2018-02-11 NOTE — Assessment & Plan Note (Signed)
suboptimal control complicated by patient's recurrent hypoglycemia caused by delaying of meals and lack of understanding about the rapid effect of mealtime mixed insulin.  She will return with a log of blood sugars so that her insulin can be adjusted safely.  Lab Results  Component Value Date   HGBA1C 7.7 (H) 02/10/2018

## 2018-02-11 NOTE — Assessment & Plan Note (Signed)
Secondary to Alzheimer Dementia per neurology evaluation Pamela Collier)..  Encouraged to continue aricept  since she denies any S/E.

## 2018-02-11 NOTE — Therapy (Signed)
[place] MAIN [facility] SERVICES [address] [place], [place], [address] Phone: [phone]   Fax:  [phone]  Physical Therapy Treatment  Patient Details  [name] MRN: [id number] Date of Birth: May 04, 1941 Referring Provider (PT): [name]    Encounter Date: 02/11/2018  PT End of Session - 02/11/18 1456    Visit Number  14    Number of Visits  17    Date for PT Re-Evaluation  03/31/18    Authorization Type  4/10    PT Start Time  0245    PT Stop Time  0330    PT Time Calculation (min)  45 min    Equipment Utilized During Treatment  Gait belt    Activity Tolerance  Patient tolerated treatment well    Behavior During Therapy  WFL for tasks assessed/performed       Past Medical History:  Diagnosis Date  . Acute respiratory failure ([place]) 11/21/2014  . Anxiety   . Arthritis 08/05/2013  . Atherosclerotic peripheral vascular disease with gangrene [facility]) august 2012  . Cardiomyopathy, ischemic    a. EF 35 to 40% by echo in 2013 b. EF improved to 50-55% by echo in 04/2015; 11/2016 Echo: EF 55-60%, Gr1 DD, mildly dil LA, nl RV fxn.  . Cerebral infarct ([place]) 08/17/2013  . Chronic diastolic CHF (congestive heart failure) (HCC)    a. EF 50-55% by echo in 04/2015; b. 11/2016 Echo: EF 55-60%, Gr1 DD.  [name] COPD (chronic obstructive pulmonary disease) ([place])   . Coronary artery disease, occlusive    a. Previous PCI to the LAD, LCx, and RCA in 2010, 2011, 2013, and 2016. All performed in [place]; b. 11/2016 Lexiscan MV: EF 43%, no ischemia->low risk.  . 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 Diabetic diarrhea ([place]) 10/03/2014  . DM type 2, uncontrolled, with renal complications ([place]) 08/12/3293  . Hepatic steatosis    by CT abd pelvis  . History of kidney stones    at a younger age  . Hyperlipidemia LDL goal <100 02/23/2014  . Hypertensive heart disease   . Osteoporosis, post-menopausal   . 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 Pill esophagitis   . Pleural effusion 10/25/2012   10/2012 CT chest >> small to moderate R lung effusion>> chylothorax, 100% lymphs 10/2013 thoracentesis> cytology negative, WBC 1471, > 90% "small lymphs" 01/2014 CT chest> near complete resolution of pleural effusion, stable lymphadenopathy 08/2014 CT chest [place] ([facility]): small right sided effusion decreased in size, stable mediastinal lymphadenopathy 1.0cm largest, 70m . 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 Sleep apnea   . Stage III chronic kidney disease ([facility]     Past Surgical History:  Procedure Laterality Date  . ABDOMINAL HYSTERECTOMY     at ge 39. secondary to bleeding/partial  . ANTERIOR CERVICAL DECOMP/DISCECTOMY FUSION N/A 10/10/2016   Procedure: Anterior Cervical Discectomy Fusion - Cervical four4- five - Cervical five-Cervical six - Cervical six-Cervical seven;  Surgeon: PEarnie Larsson MD;  [place]  Service: Neurosurgery;  Laterality: N/A;  . BREAST CYST ASPIRATION Right   . CARPAL  TUNNEL RELEASE Bilateral   . CHOLECYSTECTOMY     in [place]   . COLONOSCOPY WITH PROPOFOL N/A 01/09/2015   Procedure: COLONOSCOPY WITH PROPOFOL;  Surgeon: Lucilla Lame, MD;  Location: [facility] ENDOSCOPY;  Service: Endoscopy;  Laterality: N/A;  . CORONARY ANGIOPLASTY WITH STENT PLACEMENT     [place]; [facility]  . EYE SURGERY     bil cataracts  . HERNIA REPAIR     umbilical/ Dr Pat Patrick  . OOPHORECTOMY    . PTCA  August 2012   Right Posterior tibial artery , [place]  . TOE AMPUTATION  Sept 2012   Right 2nd toe, [place]    There were no vitals filed for this visit.  Subjective Assessment -  02/11/18 1455    Subjective  Patient reports that she is walking much better and her legs are not hurting today. She is walking without her cane today.     Pertinent History  Pamela Collier presents for evaluation of  intermittent  left sided leg pain that began without fall or injury several weeks ago.  She state that the pain started  In her left foot,  In the  Toes, and  has ascended up the lateral side of her left leg and is accompanied by stiffness of both legs.  She also describes back pain radiates into left hip and left leg.  No recent fall.       Currently in Pain?  No/denies    Pain Score  0-No pain       Treatment:  Instructed patient in LE strengthening as part of HEP: Seated with green tband around BLE: hip flexion march x15 bilaterally; hip abduction x15 bilaterally; LAQ x15 bilaterally;ankle DF x15 bilaterally; Patient required min-moderate verbal/tactile cues for correct exercise technique with cues for tband placement and to increase ROM for better strengthening;    Standing in parallel bars: Standing on airex: toe taps to 4 inch step with 2-1 rail assist x15 reps bilaterally; standing one foot on step, Alternate BUE ball pass side/side x5 reps each foot on step; mini squat unsupported x10 reps; heel/toe raises x15 reps with B rail assist for balance; Alternate march x15 bilaterally with cues to increase hip flexion for better ROM/strength; staggered stance, head turns side/side, up/down x5 reps each, each foot in front;    Patient tolerated session well; denies any pain or fatigue at end of session                       PT Education - 02/11/18 1456    Education Details  HEP,, safety    Person(s) Educated  Patient    Methods  Explanation    Comprehension  Verbalized understanding       PT Short Term Goals - 01/06/18 1114      PT SHORT TERM GOAL #1   Title  Patient will be independent in home exercise program to improve strength/mobility for  better functional independence with ADLs.    Time  4    Period  Weeks    Status  On-going    Target Date  01/06/18      PT SHORT TERM GOAL #2   Title  Patient will complete five times sit to stand test in < 15 seconds indicating an increased LE strength and improved balance.    Baseline  01/06/18 =16.50 sec    Time  4    Period  Weeks  Status  On-going    Target Date  01/06/18        PT Long Term Goals - 02/03/18 1405      PT LONG TERM GOAL #1   Title  Patient will increase six minute walk test distance to >1000 for progression to community ambulator and improve gait ability    Baseline  01/06/18=875, 850 feet 02/03/18    Time  8    Period  Weeks    Status  Partially Met    Target Date  03/31/18      PT LONG TERM GOAL #2   Title  Patient will increase 10 meter walk test to >1.23ms as to improve gait speed for better community ambulation and to reduce fall risk.    Baseline  01/06/18  .88 m/sec;, ,771mec    Time  8    Period  Weeks    Status  Partially Met    Target Date  03/31/18      PT LONG TERM GOAL #3   Title  Patient will reduce timed up and go to <11 seconds to reduce fall risk and demonstrate improved transfer/gait ability.    Baseline  01/06/18=15.08 sec, 13.80 sec    Time  8    Period  Weeks    Status  Partially Met    Target Date  03/31/18      PT LONG TERM GOAL #4   Title  Patient will be require no assist with ascend/descend 3 steps using Least restrictive assistive device.    Baseline  01/06/18 she needs the spc for steps    Time  8    Period  Weeks    Status  Partially Met    Target Date  03/31/18            Plan - 02/11/18 1457    Clinical Impression Statement  Patient demonstrates LOB with standing balance exercises indicating decreased balancing strategies. Patient did require UE support to perform sidestepping up and over exercise. Patient will benefit from further skilled therapy to return to prior level of function. .  Pt was encouraged to  perform HEP during the week in order to continue progressing balance and strength interventions.  Pt would continue to benefit from skilled therapy services in order to further address LE strength deficits and balance deficits in order to decrease fall risk and improve mobility.    Rehab Potential  Good    PT Frequency  2x / week    PT Duration  8 weeks    PT Treatment/Interventions  Aquatic Therapy;Therapeutic activities;Therapeutic exercise;Balance training;Neuromuscular re-education;Patient/family education;Manual techniques;Dry needling    PT Next Visit Plan  BLE strengthening, balance training    Consulted and Agree with Plan of Care  Patient       Patient will benefit from skilled therapeutic intervention in order to improve the following deficits and impairments:  Abnormal gait, Decreased balance, Decreased mobility, Difficulty walking, Dizziness, Pain, Impaired flexibility, Decreased activity tolerance  Visit Diagnosis: Acute right-sided low back pain with sciatica, sciatica laterality unspecified  Other abnormalities of gait and mobility  Muscle weakness (generalized)     Problem List Patient Active Problem List   Diagnosis Date Noted  . Environmental and seasonal allergies 01/15/2018  . Post-nasal drip 01/15/2018  . Restless legs 10/17/2017  . Leg pain 10/15/2017  . Normocytic anemia, not due to blood loss 09/27/2017  . Melena 09/25/2017  . Cognitive decline 08/16/2017  . Polypharmacy 07/28/2017  . Pneumonia 05/26/2017  .  Pill esophagitis 05/23/2017  . CKD (chronic kidney disease) stage 4, GFR 15-29 ml/min (HCC) 11/15/2016  . History of fall 11/12/2016  . Osteoarthritis 11/10/2016  . Right arm pain 10/15/2016  . Cervical stenosis of spinal canal 10/10/2016  . Cervical radiculopathy at C7 07/02/2016  . Acute midline thoracic back pain 07/02/2016  . Epigastric hernia 04/01/2016  . Umbilical pain 62/13/0865  . CAD in native artery   . Type 2 DM with CKD stage 3 and  hypertension ([place])   . Chronic renal insufficiency   . Hypertensive heart disease   . OSA on CPAP 07/07/2015  . Hospital discharge follow-up 07/07/2015  . Chest pain 06/29/2015  . Pleural effusion, right   . Chronic diastolic CHF (congestive heart failure) ([place]) 04/18/2015  . Diabetic neuropathy ([place]) 02/21/2015  . Benign neoplasm of sigmoid colon   . Low back pain with radiation 04/04/2014  . Essential hypertension 04/04/2014  . Hyperlipidemia 02/23/2014  . Long-term use of high-risk medication 02/23/2014  . Other long term (current) drug therapy 02/23/2014  . Dermatophytic onychia 08/17/2013  . Stable angina ([place]) 08/17/2013  . Cerebral infarct ([place]) 08/17/2013  . Bony exostosis 08/17/2013  . Obesity, unspecified 08/17/2013  . Arthritis 08/05/2013  . Unsteadiness on feet 03/24/2013  . History of colonic polyps 02/20/2013  . Sarcoidosis ([place]) 12/07/2012  . Dysphagia 12/07/2012  . Pulmonary nodule seen on imaging study 05/20/2012  . 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 Diabetic nephropathy associated with type 2 diabetes mellitus ([place]) 02/29/2012  . Cardiomyopathy, ischemic   . Hepatic steatosis   . Osteoporosis, post-menopausal   . Peripheral vascular disease due to secondary diabetes mellitus ([place])   . Double vessel coronary artery disease 12/14/2011    Alanson Puls, PT DPT 02/11/2018, 2:58 PM  [place] MAIN [facility] SERVICES [address] [place], [place], [address] Phone: [phone]   Fax:  [phone]  [name] MRN: [id number] Date of Birth: 08-04-1940

## 2018-02-11 NOTE — Assessment & Plan Note (Signed)
Continue losartan . Her proteinuria is monitored by her neprhologist   Lab Results  Component Value Date   CREATININE 2.12 (H) 02/10/2018   Lab Results  Component Value Date   MICROALBUR 28.9 (H) 09/26/2015

## 2018-02-11 NOTE — Assessment & Plan Note (Addendum)
Given her pulmonary nterstitial infiltrate and hypercalcemia on today's profile , sI am concerned that she may be having a recurrence.  Will involve her pulmonlogist and nephrologist

## 2018-02-11 NOTE — Telephone Encounter (Signed)
Please ask [facility] to continue providing periodic nurse visits by Jonne Ply to assist with medication administration (I pill boxes,  Weekly) if they will do that for patient

## 2018-02-11 NOTE — Telephone Encounter (Signed)
Pt called back and stated that it is [facility] that is coming out to her house and the nurses name is Jonne Ply.

## 2018-02-11 NOTE — Assessment & Plan Note (Signed)
She has had recent deterioration in GFR based on today's labs.  GFR is now 29 and she has hypercalcemia.  ionzied calcium PTH<  And PTH rp ordered

## 2018-02-14 IMAGING — CT CT HEAD W/O CM
4 series · 17 of 47 positions shown, 19 images · non-contrast
Comparison: 10/02/2015

CLINICAL DATA: Headache with dizziness and syncope.

EXAM:
CT HEAD WITHOUT CONTRAST
TECHNIQUE: Contiguous axial images were obtained from the base of the skull
through the vertex without intravenous contrast.

[Series 2: head wo · axial · 0.41mm/px · z∈[+322,+432]mm · 7 of 30 slices shown, 9 images]
[im 4/30  brain]
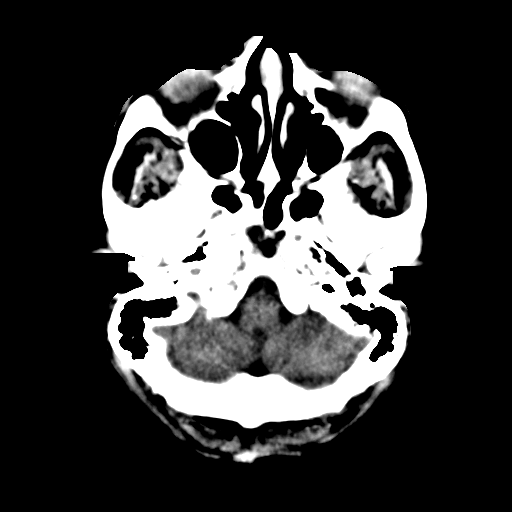
[im 4/30  bone]
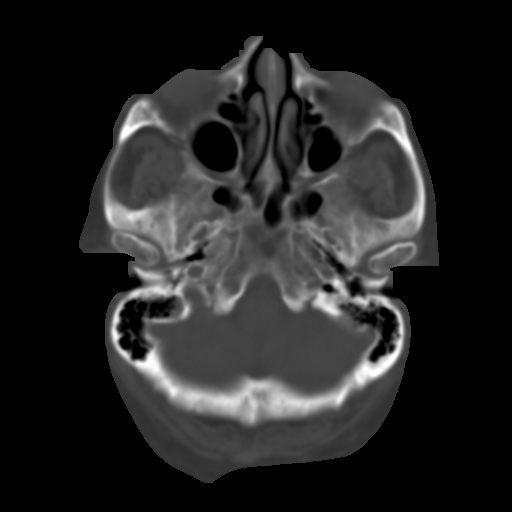
[im 8/30  brain]
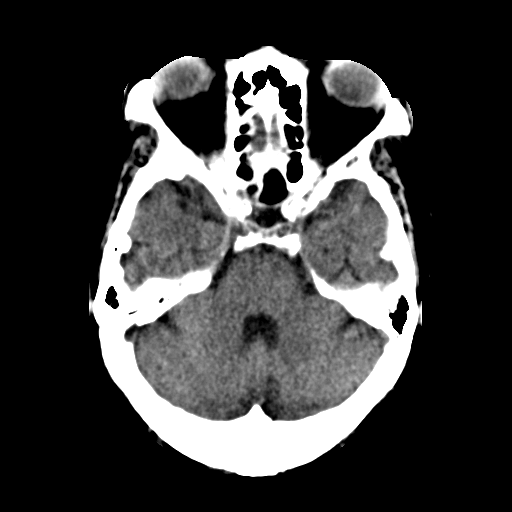
[im 11/30  brain]
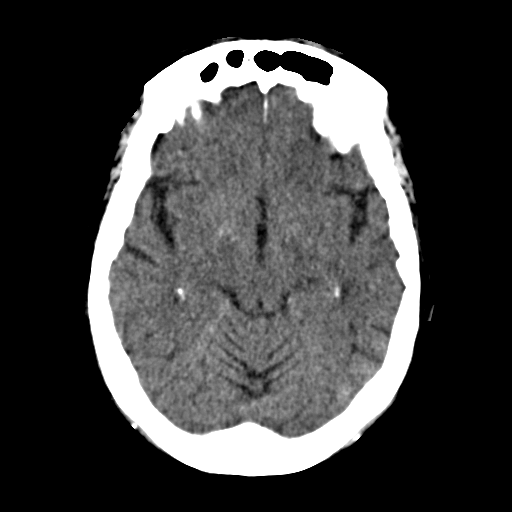
[im 15/30  brain]
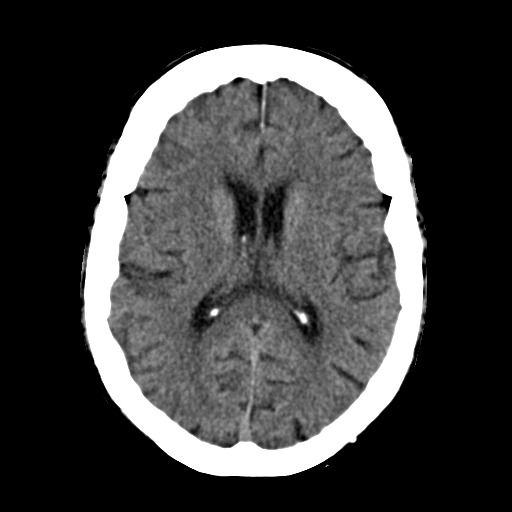
[im 19/30  brain]
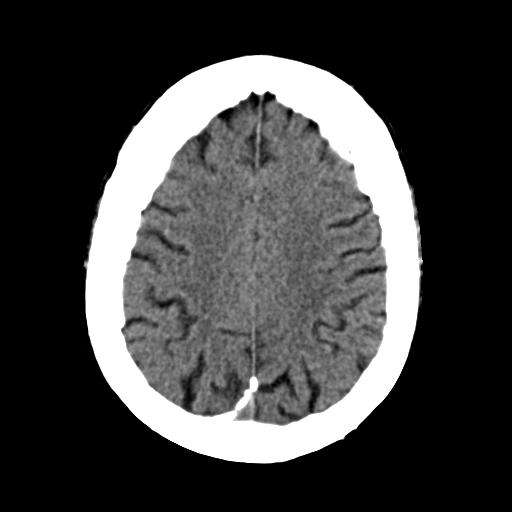
[im 19/30  bone]
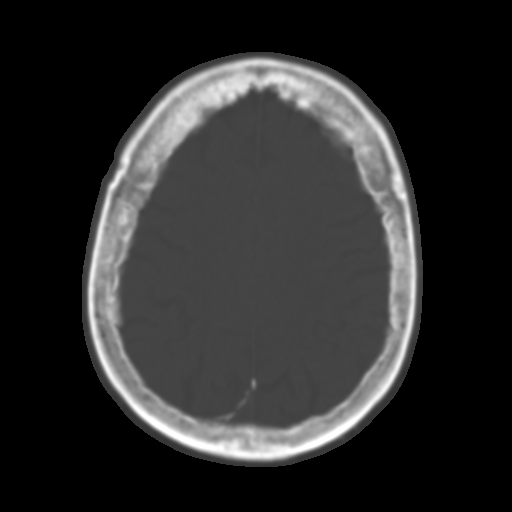
[im 22/30  brain]
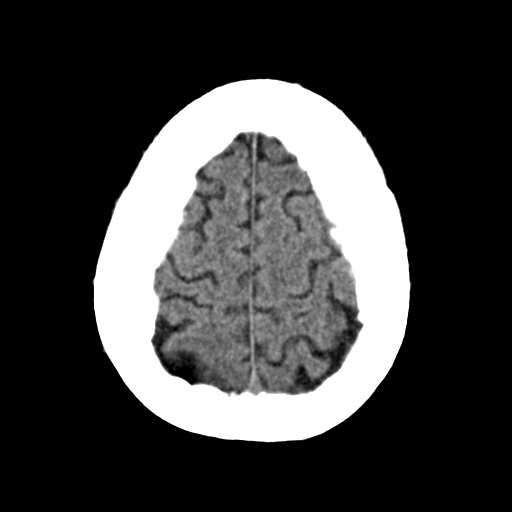
[im 26/30  brain]
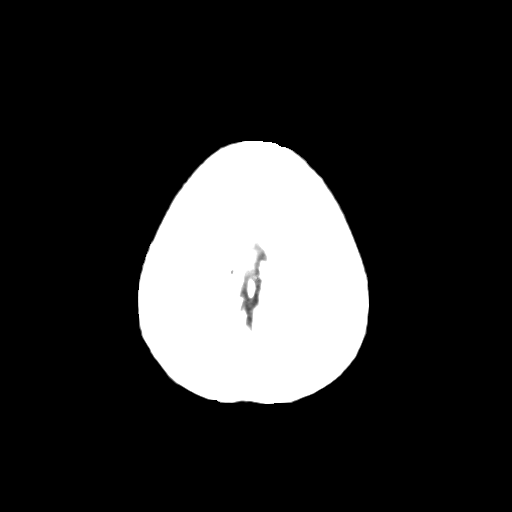

[Series 3: head bone · axial · 0.41mm/px · z∈[+322,+374]mm · 4 of 75 slices shown]
[im 8/75  bone]
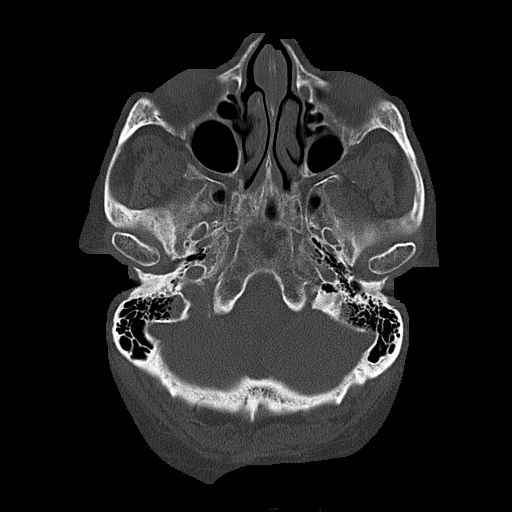
[im 15/75  bone]
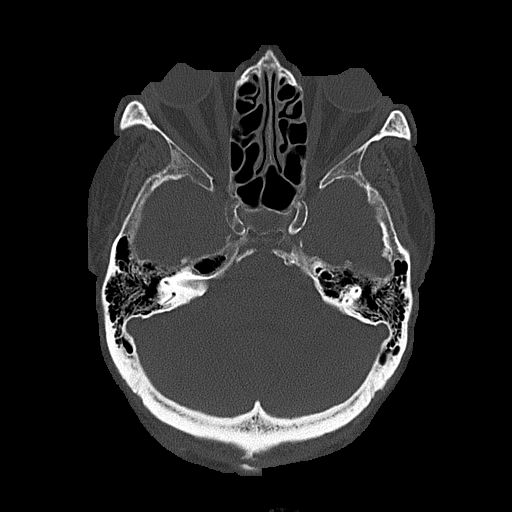
[im 23/75  bone]
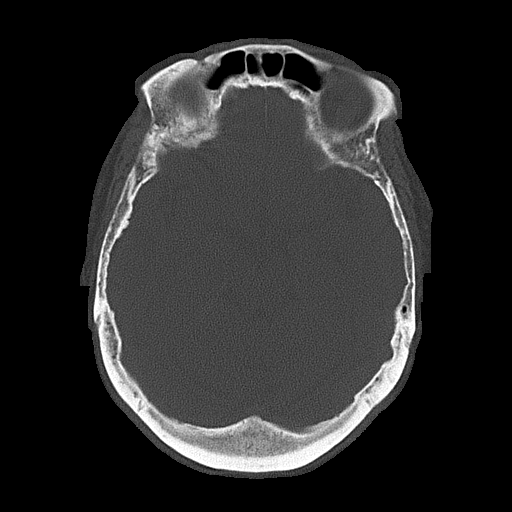
[im 34/75  bone]
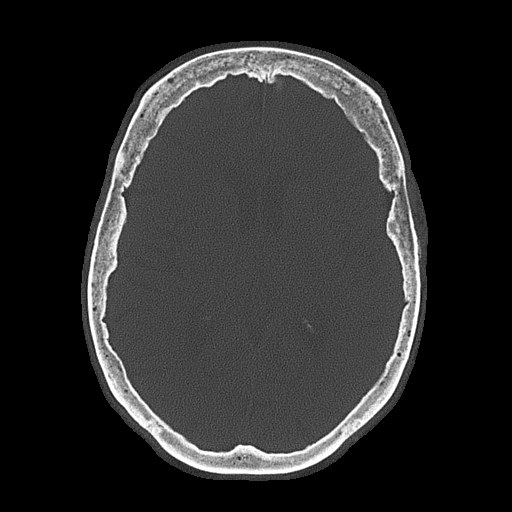

[Series 4: coronal soft tissue · coronal · 0.30mm/px · 3 of 67 slices shown]
[im 23/67  brain]
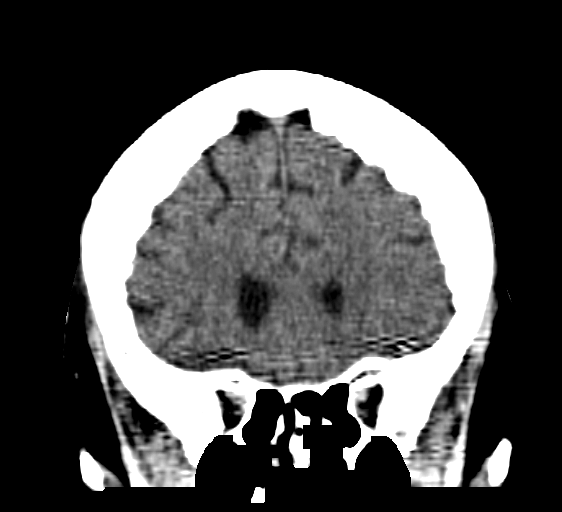
[im 30/67  brain]
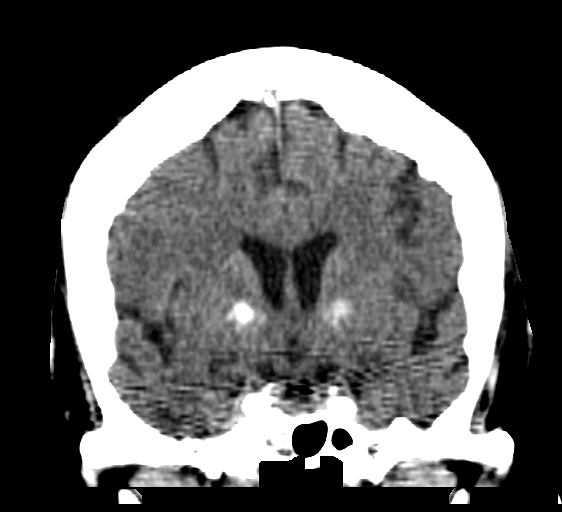
[im 37/67  brain]
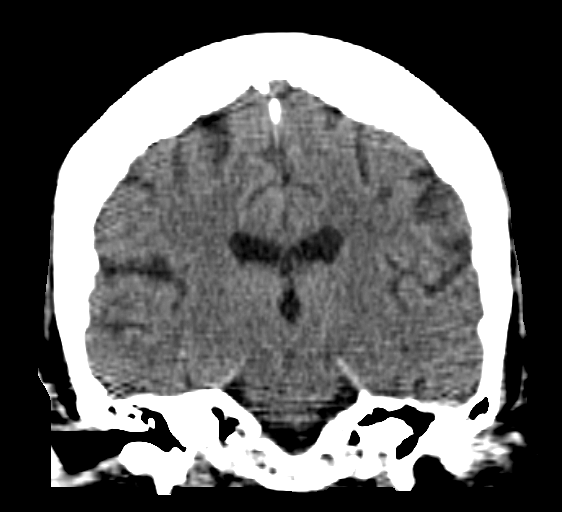

[Series 5: sagittal soft tissue · sagittal · 0.30mm/px · 3 of 53 slices shown]
[im 18/53  brain]
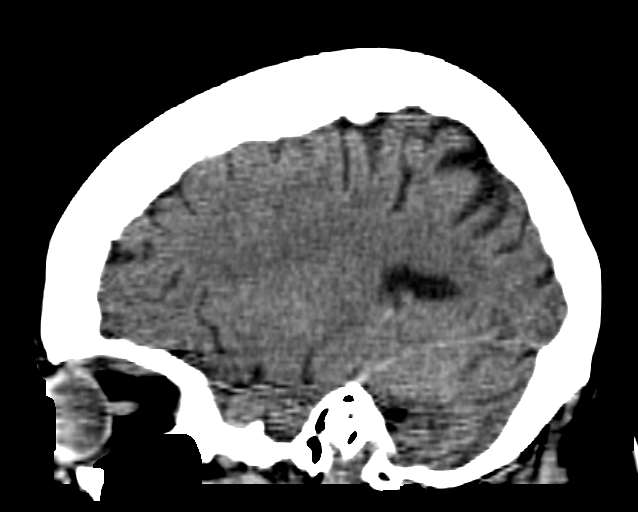
[im 27/53  brain]
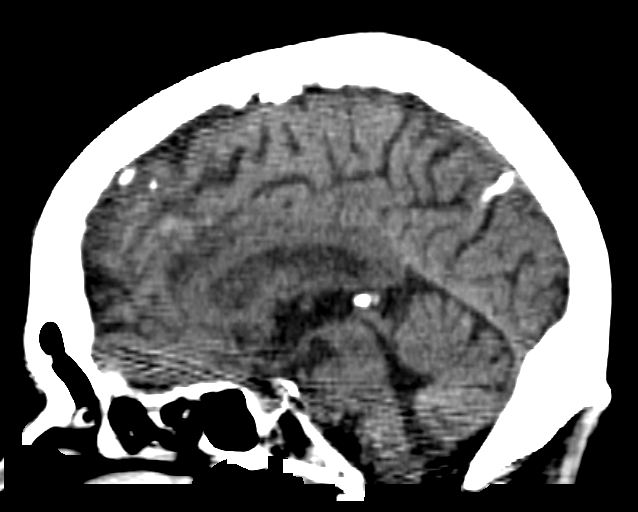
[im 35/53  brain]
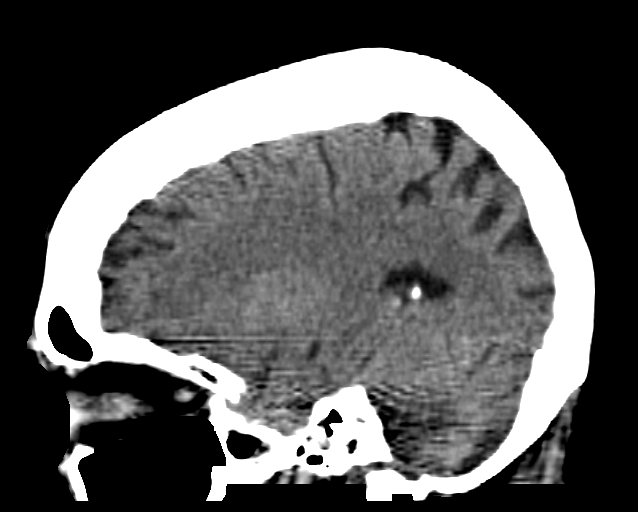

[17 of 47 positions shown; findings below may reference images not displayed]

FINDINGS: Skull and Sinuses:Negative for fracture or destructive process.
Hyperostosis. No sinusitis or mastoiditis.

Visualized orbits: Negative.

Brain: No evidence of acute infarction, hemorrhage, hydrocephalus,
or mass lesion/mass effect.

Generalized atrophy that is mild but underestimated due to
hyperostosis.
IMPRESSION: No acute finding or change from prior.

## 2018-02-15 ENCOUNTER — Telehealth: Payer: Self-pay | Admitting: Internal Medicine

## 2018-02-15 ENCOUNTER — Other Ambulatory Visit: Payer: Self-pay | Admitting: Internal Medicine

## 2018-02-15 NOTE — Progress Notes (Signed)
I have scheduled this pt for her lab work for 02/16/18 at 3:45.     She is suppose to bring Dr. Derrel Nip a log of her glucose readings.    She needs a machine but does not know where to get one plus be trained to use it.  Is it possible someone could help her with this when she comes in for her blood work on 02/16/18?    Thank you so much.

## 2018-02-15 NOTE — Telephone Encounter (Signed)
Copied from [place] [id number]. Topic: Quick Communication - See Telephone Encounter >> Feb 15, 2018  1:15 PM Sheran Luz wrote: CRM for notification. See Telephone encounter for: 02/15/18.  Pt called stating her blood glucose meter is broken-will not give reading. Pt would like to know if Dr. Derrel Nip could call her in another to pharmacy. Pt states she currently has the accu-check aviva. Please advise.

## 2018-02-16 ENCOUNTER — Ambulatory Visit: Payer: Medicare Other | Admitting: Physical Therapy

## 2018-02-16 ENCOUNTER — Other Ambulatory Visit: Payer: Self-pay | Admitting: Internal Medicine

## 2018-02-16 ENCOUNTER — Other Ambulatory Visit: Payer: Medicare Other

## 2018-02-16 ENCOUNTER — Telehealth: Payer: Self-pay

## 2018-02-16 DIAGNOSIS — N181 Chronic kidney disease, stage 1: Secondary | ICD-10-CM | POA: Diagnosis not present

## 2018-02-16 MED ORDER — GLUCOSE BLOOD VI STRP: ORAL_STRIP | 5 refills | Status: DC

## 2018-02-16 MED ORDER — GLUCOSE BLOOD VI STRP: ORAL_STRIP | 12 refills | Status: DC

## 2018-02-16 NOTE — Telephone Encounter (Signed)
Copied from [place] [id number]. Topic: Referral - Request >> Feb 16, 2018  2:14 PM Antonieta Iba C wrote: Reason for CRM: pt says that she need a referral to [facility] vein specialist for her legs.

## 2018-02-16 NOTE — Addendum Note (Signed)
Addended by: Arby Barrette on: 02/16/2018 12:58 PM   Modules accepted: Orders

## 2018-02-16 NOTE — Progress Notes (Signed)
Glucose meter called in.  Check sugars 1-2 times daily,  Fasting and post prandial  Lab Results  Component Value Date   HGBA1C 7.7 (H) 02/10/2018

## 2018-02-17 LAB — CBC WITH DIFFERENTIAL/PLATELET
Basophils Absolute: 50 cells/uL (ref 0–200)
Basophils Relative: 0.6 %
Eosinophils Absolute: 183 cells/uL (ref 15–500)
Eosinophils Relative: 2.2 %
HEMATOCRIT: 31.6 % — AB (ref 35.0–45.0)
HEMOGLOBIN: 10.5 g/dL — AB (ref 11.7–15.5)
LYMPHS ABS: 1403 {cells}/uL (ref 850–3900)
MCH: 28.8 pg (ref 27.0–33.0)
MCHC: 33.2 g/dL (ref 32.0–36.0)
MCV: 86.6 fL (ref 80.0–100.0)
MONOS PCT: 10.9 %
MPV: 10 fL (ref 7.5–12.5)
NEUTROS ABS: 5760 {cells}/uL (ref 1500–7800)
NEUTROS PCT: 69.4 %
Platelets: 264 10*3/uL (ref 140–400)
RBC: 3.65 10*6/uL — AB (ref 3.80–5.10)
RDW: 14.4 % (ref 11.0–15.0)
Total Lymphocyte: 16.9 %
WBC mixed population: 905 cells/uL (ref 200–950)
WBC: 8.3 10*3/uL (ref 3.8–10.8)

## 2018-02-17 LAB — CALCIUM, IONIZED: Calcium, Ion: 5.97 mg/dL — ABNORMAL HIGH (ref 4.8–5.6)

## 2018-02-17 LAB — PARATHYROID HORMONE, INTACT (NO CA): PTH: 9 pg/mL — ABNORMAL LOW (ref 14–64)

## 2018-02-17 MED ORDER — BLOOD GLUCOSE METER KIT: PACK | 0 refills | Status: DC

## 2018-02-17 NOTE — Telephone Encounter (Signed)
Pt is requesting a referral to [facility] vein specialist for her leg pain and weakness. Pt is scheduled to see you tomorrow at 12:30pm.

## 2018-02-17 NOTE — Telephone Encounter (Signed)
rx has been faxed to [facility] on [address].

## 2018-02-18 ENCOUNTER — Other Ambulatory Visit: Payer: Self-pay | Admitting: *Deleted

## 2018-02-18 ENCOUNTER — Ambulatory Visit (INDEPENDENT_AMBULATORY_CARE_PROVIDER_SITE_OTHER): Payer: Medicare Other | Admitting: Internal Medicine

## 2018-02-18 ENCOUNTER — Encounter: Payer: Medicare Other | Admitting: Physical Therapy

## 2018-02-18 ENCOUNTER — Encounter: Payer: Self-pay | Admitting: Internal Medicine

## 2018-02-18 ENCOUNTER — Other Ambulatory Visit: Payer: Self-pay

## 2018-02-18 VITALS — BP 136/60 | HR 91 | Temp 98.1°F | Resp 15 | Ht 67.0 in | Wt 187.4 lb

## 2018-02-18 DIAGNOSIS — I255 Ischemic cardiomyopathy: Secondary | ICD-10-CM

## 2018-02-18 DIAGNOSIS — R4189 Other symptoms and signs involving cognitive functions and awareness: Secondary | ICD-10-CM | POA: Diagnosis not present

## 2018-02-18 DIAGNOSIS — E509 Vitamin A deficiency, unspecified: Secondary | ICD-10-CM | POA: Diagnosis not present

## 2018-02-18 DIAGNOSIS — M79606 Pain in leg, unspecified: Secondary | ICD-10-CM

## 2018-02-18 DIAGNOSIS — R0989 Other specified symptoms and signs involving the circulatory and respiratory systems: Secondary | ICD-10-CM | POA: Diagnosis not present

## 2018-02-18 LAB — TSH: TSH: 1.23 u[IU]/mL (ref 0.35–4.50)

## 2018-02-18 LAB — VITAMIN D 25 HYDROXY (VIT D DEFICIENCY, FRACTURES): VITD: 22.46 ng/mL — ABNORMAL LOW (ref 30.00–100.00)

## 2018-02-18 MED ORDER — BLOOD GLUCOSE METER KIT: PACK | 0 refills | Status: DC

## 2018-02-18 NOTE — Patient Outreach (Signed)
[place] [facility]) Care Management  02/18/2018  IVANIA TEAGARDEN Sep 04, 1940 [phone]   Unsuccessful outreach call to patient , unable to leave a message for return call.   Will attempt call later on today.   1540  Return call to patient ,no answer unable to leave a message, phone just continued to ring.    Plan  Will plan return call in the next 4 business days.     [name], RN, [place] Management Coordinator  [phone]- Mobile [phone]- Toll Free Main Office

## 2018-02-18 NOTE — Progress Notes (Addendum)
Subjective:  Patient ID: Pamela Collier, female    DOB: 01-Mar-1941  Age: 77 y.o. MRN: [id number]  CC: The primary encounter diagnosis was Diminished pulses in lower extremity. Diagnoses of Hypercalcemia, Pain of lower extremity, unspecified laterality, Cognitive decline, and Vitamin a deficiency, unspecified were also pertinent to this visit.  HPI [name] presents for evaluation of chronic bilateral leg pain,  Last week Patient called requesting referral to [facility] .  Describes feeling that her feet have started to feel like they  "on a cloud"  And legs are difficult to lift up.  Also bothered by calf and thigh pain aggravated by walking and sitting.  Feels cramping in nature.   She has been using a walker due to fear of falling. Has resumed using Medic Alert .Marland KitchenShe has been referred for PT last week but missed her appointment  because she was afraid she would fall. .    Today states that she has been  having bloating d lower abdominal pain and distension  and back pain.  She had  one loose stool  on Monday, has only moved bowels one time since Monday and stool was formed.    Also having Low back pain  that does not radiate to groin or leg.   With DDD noted on prior   Pulses feeble,  Filament exam notes absence of feeling exam poor.   Has not had a home health RN visit in several weeks,  Feels that the visits were helpful in helpign her manage her medications.   Lab Results  Component Value Date   HGBA1C 7.7 (H) 02/10/2018        Outpatient Medications Prior to Visit  Medication Sig Dispense Refill  . acetaminophen (TYLENOL) 500 MG tablet Take 1,000 mg by mouth every 6 (six) hours as needed for mild pain or headache.     . ALPRAZolam (XANAX) 0.25 MG tablet TAKE 1 TABLET BY MOUTH ONCE DAILY 30 tablet 2  . aspirin 81 MG chewable tablet Chew 81 mg by mouth daily.     [name] BRILINTA 90 MG TABS tablet TAKE 1 TABLET TWICE A DAY 180 tablet 1  . docusate sodium (COLACE) 100 MG  capsule Take 100 mg by mouth daily as needed for mild constipation. Reported on 11/09/2015    . donepezil (ARICEPT) 5 MG tablet Take 1 tablet (5 mg total) by mouth at bedtime. 90 tablet 1  . feeding supplement, GLUCERNA SHAKE, (GLUCERNA SHAKE) LIQD Take 237 mLs by mouth 2 (two) times daily between meals. 60 Can 0  . fluticasone (FLONASE) 50 MCG/ACT nasal spray Place 2 sprays into both nostrils daily as needed for rhinitis. 16 g 5  . furosemide (LASIX) 20 MG tablet Take 1 tablet (20 mg total) by mouth every other day. 30 tablet 0  . glucose blood (ACCU-CHEK AVIVA PLUS) test strip USE TO CHECK GLUCOSE THREE TIMES DAILY 200 each 5  . glucose blood test strip Use to check blood sugars 2 times daily 100 each 12  . guaiFENesin (MUCINEX) 600 MG 12 hr tablet Take 1 tablet (600 mg total) by mouth 2 (two) times daily. 30 tablet 0  . Insulin Lispro Prot & Lispro (HUMALOG MIX 75/25 KWIKPEN) (75-25) 100 UNIT/ML Kwikpen INJECT 45 TO 60 UNITS TWICE DAILY BEFORE MORNING AND EVENING MEALS 105 mL 4  . isosorbide mononitrate (IMDUR) 30 MG 24 hr tablet TAKE 1 TABLET DAILY 90 tablet 1  . loratadine (CLARITIN) 10 MG tablet Take 1 tablet (  10 mg total) by mouth daily. 30 tablet 2  . losartan (COZAAR) 100 MG tablet TAKE 1 TABLET DAILY 90 tablet 1  . metoprolol succinate (TOPROL-XL) 25 MG 24 hr tablet TAKE 1 TABLET DAILY 90 tablet 1  . mometasone-formoterol (DULERA) 200-5 MCG/ACT AERO Inhale 2 puffs into the lungs 2 (two) times daily. 13 g 3  . nitroGLYCERIN (NITROLINGUAL) 0.4 MG/SPRAY spray Place 1 spray under the tongue every 5 (five) minutes x 3 doses as needed for chest pain. 12 g 12  . pregabalin (LYRICA) 100 MG capsule TAKE 1 CAPSULE BY MOUTH THREE TIMES DAILY 90 capsule 5  . rosuvastatin (CRESTOR) 20 MG tablet TAKE ONE TABLET BY MOUTH ONCE DAILY 90 tablet 1  . B-D UF III MINI PEN NEEDLES 31G X 5 MM MISC USE THREE TIMES A DAY 270 each 3  . blood glucose meter kit and supplies Dispense based on patient and insurance  preference. Use up to three times daily as directed. (FOR ICD-10 E11.21) 1 each 0   Facility-Administered Medications Prior to Visit  Medication Dose Route Frequency Provider Last Rate Last Dose  . albuterol (PROVENTIL) (5 MG/ML) 0.5% nebulizer solution 2.5 mg  2.5 mg Nebulization Once [name], MD        Review of Systems;  Patient denies headache, fevers, malaise, unintentional weight loss, skin rash, eye pain, sinus congestion and sinus pain, sore throat, dysphagia,  hemoptysis , cough, dyspnea, wheezing, chest pain, palpitations, orthopnea, edema, abdominal pain, nausea, melena, diarrhea, constipation, flank pain, dysuria, hematuria, urinary  Frequency, nocturia, numbness, tingling, seizures,  Focal weakness, Loss of consciousness,  Tremor, insomnia, depression, anxiety, and suicidal ideation.      Objective:  BP 136/60 (BP Location: Left Arm, Patient Position: Sitting, Cuff Size: Normal)   Pulse 91   Temp 98.1 F (36.7 C) (Oral)   Resp 15   Ht _0  (1.702 m)   Wt 187 lb 6.4 oz (85 kg)   SpO2 97%   BMI 29.35 kg/m   BP Readings from Last 3 Encounters:  02/18/18 136/60  02/10/18 (!) 122/56  01/14/18 (!) 100/44    Wt Readings from Last 3 Encounters:  02/18/18 187 lb 6.4 oz (85 kg)  02/10/18 185 lb 9.6 oz (84.2 kg)  01/14/18 186 lb 6.4 oz (84.6 kg)    General appearance: alert, cooperative and appears stated age Ears: normal TM's and external ear canals both ears Throat: lips, mucosa, and tongue normal; teeth and gums normal Neck: no adenopathy, no carotid bruit, supple, symmetrical, trachea midline and thyroid not enlarged, symmetric, no tenderness/mass/nodules Back: symmetric, no curvature. ROM normal. No CVA tenderness. Lungs: clear to auscultation bilaterally Heart: regular rate and rhythm, S1, S2 normal, no murmur, click, rub or gallop Abdomen: soft, non-tender; bowel sounds normal; no masses,  no organomegaly Pulses: 2+ and symmetric Skin: Skin color,  texture, turgor normal. No rashes or lesions Lymph nodes: Cervical, supraclavicular, and axillary nodes normal.  Lab Results  Component Value Date   HGBA1C 7.7 (H) 02/10/2018   HGBA1C 7.7 (H) 09/25/2017   HGBA1C 7.5 (H) 06/08/2017    Lab Results  Component Value Date   CREATININE 2.12 (H) 02/10/2018   CREATININE 1.56 (H) 09/25/2017   CREATININE 1.48 (H) 06/08/2017    Lab Results  Component Value Date   WBC 8.3 02/16/2018   HGB 10.5 (L) 02/16/2018   HCT 31.6 (L) 02/16/2018   PLT 264 02/16/2018   GLUCOSE 64 (L) 02/10/2018   CHOL 124 11/15/2016  TRIG 175 (H) 11/15/2016   HDL 29 (L) 11/15/2016   LDLDIRECT 102.0 03/14/2016   LDLCALC 60 11/15/2016   ALT 13 09/25/2017   AST 19 09/25/2017   NA 136 02/10/2018   K 4.2 02/10/2018   CL 104 02/10/2018   CREATININE 2.12 (H) 02/10/2018   BUN 38 (H) 02/10/2018   CO2 25 02/10/2018   TSH 1.23 02/18/2018   INR 1.2 (H) 02/02/2015   HGBA1C 7.7 (H) 02/10/2018   MICROALBUR 28.9 (H) 09/26/2015    Dg Bone Density  Result Date: 11/02/2017 EXAM: DUAL X-RAY ABSORPTIOMETRY (DXA) FOR BONE MINERAL DENSITY IMPRESSION: Dear Dr Derrel Nip, Your patient Amarachi Kotz completed a BMD test on 11/02/2017 using the [place] (analysis version: 14.10) manufactured by [employer]. The following summarizes the results of our evaluation. PATIENT BIOGRAPHICAL: Name: Shaine, Mount Patient ID: [id number] Birth Date: 1941/04/04 Height: 65.0 in. Gender: Female Exam Date: 11/02/2017 Weight: 186.0 lbs. Indications: Advanced Age, History of Spinal Surgery, Hysterectomy, Oophorectomy Bilateral, Osteopenia, Postmenopausal, History of Fracture (Adult) Fractures: Ankle Treatments: ASPRIN 81 MG, Flonase, Prilosec ASSESSMENT: The BMD measured at Femur Neck Right is 0.822 g/cm2 with a T-score of -1.6. This patient is considered osteopenic according to [facility]) criteria. Site Region Measured Measured WHO Young Adult BMD Date       Age       Classification T-score AP Spine L1-L4 11/02/2017 77.5 Normal 0.5 1.251 g/cm2 DualFemur Neck Right 11/02/2017 77.5 Osteopenia -1.6 0.822 g/cm2 DualFemur Total Mean 11/02/2017 77.5 Normal -0.6 0.933 g/cm2 World Health Organization [facility]) criteria for post-menopausal, Caucasian Women: Normal:       T-score at or above -1 SD Osteopenia:   T-score between -1 and -2.5 SD Osteoporosis: T-score at or below -2.5 SD RECOMMENDATIONS: 1. All patients should optimize calcium and vitamin D intake. 2. Consider FDA-approved medical therapies in postmenopausal women and men aged 44 years and older, based on the following: a. A hip or vertebral(clinical or morphometric) fracture b. T-score < -2.5 at the femoral neck or spine after appropriate evaluation to exclude secondary causes c. Low bone mass (T-score between -1.0 and -2.5 at the femoral neck or spine) and a 10-year probability of a hip fracture > 3% or a 10-year probability of a major osteoporosis-related fracture > 20% based on the [place]-adapted WHO algorithm d. Clinician judgment and/or patient preferences may indicate treatment for people with 10-year fracture probabilities above or below these levels FOLLOW-UP: Patients with diagnosis of osteoporosis or at high risk for fracture should have regular bone mineral density tests. For patients eligible for Medicare, routine testing is allowed once every 2 years. The testing frequency can be increased to one year for patients who have rapidly progressing disease, those who are receiving or discontinuing medical therapy to restore bone mass, or have additional risk factors. FRAX* RESULTS:  (version: 3.5) 10-year Probability of Fracture1 Major Osteoporotic Fracture2 Hip Fracture 8.3% 1.6% Population: [place] (Black) Risk Factors: History of Fracture (Adult) Based on Femur (Right) Neck BMD 1 -The 10-year probability of fracture may be lower than reported if the patient has received treatment. 2 -Major Osteoporotic Fracture: Clinical Spine,  Forearm, Hip or Shoulder *FRAX is a Materials engineer of the [employer] for Metabolic Bone Disease, a [place] (WHO) [facility]. ASSESSMENT: The probability of a major osteoporotic fracture is 8.3 % within the next ten years. The probability of a hip fracture is 1.6 % within the next ten years. Electronically Signed  By: Earle Gell M.D.   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  Encouraged to continue aricept  since she denies any S/E.   Home health has been ordered to assist with medication administration       Hypercalcemia    Elevated  calcium is confirmed.  She was advised to stop all calcium and vit  D supplements and follow up with nephrology. Additional labs are pending, including SPEP PTHrp, Vitamin A.  Etc . She has a history of sarcoid involving the lungs. ,      Relevant Orders   PTH-Related Peptide   VITAMIN D 25 Hydroxy (Vit-D Deficiency, Fractures) (Completed)   TSH (Completed)   Vitamin A (Completed)   Protein electrophoresis, serum   Urinalysis, Routine w reflex microscopic   Calcium, urine, random   Calcium / creatinine ratio, urine (Completed)   Leg pain    Etiology of lower leg pain is likely diabetic neuropathy and PA, given exam,.  Referral to [facility] (per her request)  Is pending       Vitamin a deficiency, unspecified    Recommend oral supplementation  25,000 units daily        Other Visit Diagnoses    Diminished pulses in lower extremity    -  Primary   Relevant Orders   Ambulatory referral to Vascular Surgery    A total of 25 minutes of face to face time was spent with patient more than half of which was spent in counselling about the above mentioned conditions  and coordination of care  I am having Tariah L. Malizia start on vitamin A. I am also having her  maintain her acetaminophen, docusate sodium, nitroGLYCERIN, feeding supplement (GLUCERNA SHAKE), furosemide, rosuvastatin, ALPRAZolam, aspirin, isosorbide mononitrate, metoprolol succinate, BRILINTA, loratadine, guaiFENesin, pregabalin, fluticasone, losartan, Insulin Lispro Prot & Lispro, mometasone-formoterol, donepezil, glucose blood, and glucose blood.  Meds ordered this encounter  Medications  . Beta Carotene (VITAMIN A) 25000 UNIT capsule    Sig: Take 1 capsule (25,000 Units total) by mouth daily.    Dispense:  90 capsule    Refill:  1    There are no discontinued medications.  Follow-up: No follow-ups on file.   [name], MD

## 2018-02-18 NOTE — Patient Instructions (Addendum)
Your referral to [facility] Surgery is in process   Your additional tests today are to evaluate your elevated calcium level,  Which may be due to your diagnosis of sarcoidosis   Please stop taking any calcium supplements and any Vitamin D supplements until you See Dr Rolly Salter

## 2018-02-19 ENCOUNTER — Other Ambulatory Visit: Payer: Self-pay | Admitting: *Deleted

## 2018-02-19 LAB — CALCIUM / CREATININE RATIO, URINE
CALCIUM, RANDOM URINE: 11.8 mg/dL
CALCIUM/CREATININE RATIO: 151 mg/g creat (ref 10–320)
Creatinine, Urine: 83 mg/dL (ref 20–275)

## 2018-02-19 NOTE — Patient Outreach (Addendum)
[facility]) Care Management  02/19/2018  Pamela Collier May 28, 1940 [id number]   Telephone assessment   Incoming call from patient , HIPAA verified, patient discussed being busy going to doctor appointments.   Patient discussed problem that she is having with her feet and legs bothering her, reports.she had PCP visit on yesterday and lots of lab test where done. Patient voiced being concerned about results reassured her that office would notify her .Patient voiced not sure she understands everything on her last MD office visit instructions.   Patient further discussed   Unable to check blood sugar today, she reports that she picked up a new meter at [place] but did not have any new strips only lancet and unsure how to use meter. . Discussed with patient, if she had strips from previous same brand meter she reports no.  Placed call to [facility] that report patient picked up [facility] meter on yesterday Accu check [employer], same meter as she previously had and prescriptions for strips filled on 9/10 and not due for refill.  Placed return call to patient to explain this and she will look for strips.   Memory concerns  Patient reports that her memory is not good and the new medication that neurology placed her on his not helping.  Patient reports that she received a phone call on this morning that she has an appointment on the 10/14 at 3 PM, states she wrote it on her calendar.   Medications Patient reports she is having more difficulty with keeping track of her medications , and she wants to make sure that she has everything from her recent visit to [facility]. She is requesting a home visit.   Fall risk Patient discussed due to her problem with her feet , she is more unsteady, almost fell the other day . Reports he is unable to participate in outpatient therapy.  Reports that she is called [employer] about setting up her Medic Alert system again as she had previously  cancelled it, discussed other option such as [facility] medic alert system she wishes to proceed with her current plan .     Plan  Will plan home visit in the next business day, to assist patient with blood glucose meter and review recent office visit instructions and assist patient with pill box and assess for further care coordination needs.  Reinforced fall prevention measures, using walker, wearing supportive shoes, standing a few seconds before starting to walk.  Addendum : Received return call from patient stating she had Podiatry appointment on 10/14, requesting to reschedule home visit for later in week.   [name], RN, [facility] Management Coordinator  [phone]- Mobile [phone]- Toll Free Main Office

## 2018-02-20 LAB — PTH-RELATED PEPTIDE: PTH-Related Protein (PTH-RP): 21 pg/mL (ref 14–27)

## 2018-02-20 NOTE — Assessment & Plan Note (Signed)
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  Encouraged to continue aricept  since she denies any S/E.   Home health has been ordered to assist with medication administration

## 2018-02-20 NOTE — Assessment & Plan Note (Addendum)
Elevated  calcium is confirmed.  She was advised to stop all calcium and vit  D supplements and follow up with nephrology. Additional labs are pending, including SPEP PTHrp, Vitamin A.  Etc . She has a history of sarcoid involving the lungs. ,

## 2018-02-20 NOTE — Assessment & Plan Note (Addendum)
Etiology of lower leg pain is likely diabetic neuropathy and PA, given exam,.  Referral to [facility] (per her request)  Is pending

## 2018-02-22 ENCOUNTER — Ambulatory Visit: Payer: Medicare Other | Admitting: *Deleted

## 2018-02-22 DIAGNOSIS — L97521 Non-pressure chronic ulcer of other part of left foot limited to breakdown of skin: Secondary | ICD-10-CM | POA: Diagnosis not present

## 2018-02-22 DIAGNOSIS — L851 Acquired keratosis [keratoderma] palmaris et plantaris: Secondary | ICD-10-CM | POA: Diagnosis not present

## 2018-02-22 DIAGNOSIS — E509 Vitamin A deficiency, unspecified: Secondary | ICD-10-CM | POA: Insufficient documentation

## 2018-02-22 DIAGNOSIS — B351 Tinea unguium: Secondary | ICD-10-CM | POA: Diagnosis not present

## 2018-02-22 DIAGNOSIS — E1042 Type 1 diabetes mellitus with diabetic polyneuropathy: Secondary | ICD-10-CM | POA: Diagnosis not present

## 2018-02-22 LAB — PROTEIN ELECTROPHORESIS, SERUM
ALPHA 1: 0.4 g/dL — AB (ref 0.2–0.3)
Albumin ELP: 3.3 g/dL — ABNORMAL LOW (ref 3.8–4.8)
Alpha 2: 1 g/dL — ABNORMAL HIGH (ref 0.5–0.9)
Beta 2: 0.7 g/dL — ABNORMAL HIGH (ref 0.2–0.5)
Beta Globulin: 0.4 g/dL (ref 0.4–0.6)
GAMMA GLOBULIN: 1.4 g/dL (ref 0.8–1.7)
Total Protein: 7.2 g/dL (ref 6.1–8.1)

## 2018-02-22 LAB — VITAMIN A: VITAMIN A (RETINOIC ACID): 31 ug/dL — AB (ref 38–98)

## 2018-02-22 MED ORDER — VITAMIN A-BETA CAROTENE 25000 UNITS PO CAPS: 25000.0000 [IU] | ORAL_CAPSULE | Freq: Every day | ORAL | 1 refills | Status: DC

## 2018-02-22 NOTE — Addendum Note (Signed)
Addended by: [name] on: 02/22/2018 11:56 AM   Modules accepted: Orders

## 2018-02-22 NOTE — Assessment & Plan Note (Signed)
Recommend oral supplementation  25,000 units daily

## 2018-02-23 ENCOUNTER — Ambulatory Visit: Payer: Medicare Other | Admitting: *Deleted

## 2018-02-23 ENCOUNTER — Ambulatory Visit: Payer: Medicare Other | Admitting: Physical Therapy

## 2018-02-23 DIAGNOSIS — R51 Headache: Secondary | ICD-10-CM | POA: Diagnosis not present

## 2018-02-23 DIAGNOSIS — R413 Other amnesia: Secondary | ICD-10-CM | POA: Diagnosis not present

## 2018-02-23 DIAGNOSIS — G629 Polyneuropathy, unspecified: Secondary | ICD-10-CM | POA: Diagnosis not present

## 2018-02-23 DIAGNOSIS — R251 Tremor, unspecified: Secondary | ICD-10-CM | POA: Diagnosis not present

## 2018-02-23 NOTE — Telephone Encounter (Signed)
Spoke with [facility] clinical nurse, Ailene Ravel and she stated that they did get the referral that was sent over in July, however they had her down as a non admit because she is getting outpatient PT. Ailene Ravel explained that since the pt is getting outpatient PT she is not considered homebound and that disqualifies her for home health. She did state if the pt is willing to get the PT by them at home then they would also be able to do the medication management part and help her set up weekly pill boxes and come up with a chart for the pt.

## 2018-02-23 NOTE — Telephone Encounter (Signed)
Find out if patient is driving herself to appointments  If so,  She does not qualify for home health

## 2018-02-24 LAB — PTH-RELATED PEPTIDE: PTH-Related Protein (PTH-RP): 17 pg/mL (ref 14–27)

## 2018-02-24 NOTE — Telephone Encounter (Signed)
Pt called and requested to cancel referral to [facility]. She states that [facility] told her they have someone in their practice that can see her for her legs and she wants to go there.

## 2018-02-25 ENCOUNTER — Other Ambulatory Visit: Payer: Self-pay | Admitting: *Deleted

## 2018-02-25 ENCOUNTER — Other Ambulatory Visit: Payer: Self-pay | Admitting: Internal Medicine

## 2018-02-25 ENCOUNTER — Encounter: Payer: Self-pay | Admitting: *Deleted

## 2018-02-25 ENCOUNTER — Encounter: Payer: Medicare Other | Admitting: Physical Therapy

## 2018-02-25 ENCOUNTER — Other Ambulatory Visit: Payer: Self-pay

## 2018-02-25 DIAGNOSIS — I255 Ischemic cardiomyopathy: Secondary | ICD-10-CM

## 2018-02-25 MED ORDER — LOSARTAN POTASSIUM 100 MG PO TABS: 100.0000 mg | ORAL_TABLET | Freq: Every day | ORAL | 0 refills | Status: DC

## 2018-02-25 NOTE — Telephone Encounter (Signed)
Spoke with pt today and she stated that a nurse came out today and helped her with her medications.

## 2018-02-25 NOTE — Telephone Encounter (Signed)
Copied from [place] [id number]. Topic: Quick Communication - Rx Refill/Question >> Feb 25, 2018  4:46 PM Reyne [name] L wrote: Medication: furosemide (LASIX) 20 MG tablet  Has the patient contacted their pharmacy? Yes - pharmacy states they need new script (Agent: If no, request that the patient contact the pharmacy for the refill.) (Agent: If yes, when and what did the pharmacy advise?)  Preferred Pharmacy (with phone number or street name): [facility] [place] [phone] (Phone) [phone] (Fax)  Agent: Please be advised that RX refills may take up to 3 business days. We ask that you follow-up with your pharmacy.

## 2018-02-25 NOTE — Patient Outreach (Signed)
[facility]) Care Management   02/25/2018  Pamela Collier [birth year] [id number]  Pamela Collier is an 77 y.o. female  Subjective:  Patient discussed having difficulty with keeping track of medications at times due to her memory.   Patient discussed problems with her legs and test being ordered to [facility], patient states that it is too far to her to travel to [place] , so she mentioned it at her  recent  visit at neurology   visit she states test to be done at that office on next week.    Patient discussed recent visit to Podiatry office and ordered daily dressing and neosporin to left foot 3rd toe.    Objective:  BP 128/60 (BP Location: Right Arm, Patient Position: Sitting, Cuff Size: Large)   Pulse 60   Resp 18   Wt 183 lb (83 kg)   SpO2 96%   BMI 28.66 kg/m  Review of Systems  Constitutional: Negative.   HENT: Negative.   Eyes: Negative.   Respiratory: Negative.   Cardiovascular: Negative.   Gastrointestinal: Negative.   Genitourinary: Negative.   Musculoskeletal: Positive for joint pain.  Skin: Negative.   Neurological: Positive for weakness.  Endo/Heme/Allergies: Negative.   Psychiatric/Behavioral: Positive for memory loss.    Physical Exam  Constitutional: She is oriented to person, place, and time. She appears well-developed and well-nourished.  Cardiovascular: Normal rate and normal heart sounds.  Respiratory: Effort normal and breath sounds normal.  GI: Soft. Bowel sounds are normal.  Neurological: She is alert and oriented to person, place, and time.  Skin: Skin is warm and dry.     Psychiatric: She has a normal mood and affect. Her behavior is normal. Judgment and thought content normal.    Encounter Medications:   Outpatient Encounter Medications as of 02/25/2018  Medication Sig Note  . acetaminophen (TYLENOL) 500 MG tablet Take 1,000 mg by mouth every 6 (six) hours as needed for mild pain or headache.    . ALPRAZolam (XANAX) 0.25  MG tablet TAKE 1 TABLET BY MOUTH ONCE DAILY 09/23/2017: Taking as needed  . aspirin 81 MG chewable tablet Chew 81 mg by mouth daily.    . B-D UF III MINI PEN NEEDLES 31G X 5 MM MISC USE THREE TIMES A DAY   . Beta Carotene (VITAMIN A) 25000 UNIT capsule Take 1 capsule (25,000 Units total) by mouth daily.   . blood glucose meter kit and supplies Dispense based on patient and insurance preference. Use up to three times daily as directed. 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 BRILINTA 90 MG TABS tablet TAKE 1 TABLET TWICE A DAY   . docusate sodium (COLACE) 100 MG capsule Take 100 mg by mouth daily as needed for mild constipation. Reported on 11/09/2015   . donepezil (ARICEPT) 5 MG tablet Take 1 tablet (5 mg total) by mouth at bedtime.   . feeding supplement, GLUCERNA SHAKE, (GLUCERNA SHAKE) LIQD Take 237 mLs by mouth 2 (two) times daily between meals.   . fluticasone (FLONASE) 50 MCG/ACT nasal spray Place 2 sprays into both nostrils daily as needed for rhinitis.   . furosemide (LASIX) 20 MG tablet Take 1 tablet (20 mg total) by mouth every other day.   [name] glucose blood (ACCU-CHEK AVIVA PLUS) test strip USE TO CHECK GLUCOSE THREE TIMES DAILY   . glucose blood test strip Use to check blood sugars 2 times daily   . guaiFENesin (MUCINEX) 600 MG 12 hr tablet Take 1 tablet (  600 mg total) by mouth 2 (two) times daily.   . Insulin Lispro Prot & Lispro (HUMALOG MIX 75/25 KWIKPEN) (75-25) 100 UNIT/ML Kwikpen INJECT 45 TO 60 UNITS TWICE DAILY BEFORE MORNING AND EVENING MEALS   . isosorbide mononitrate (IMDUR) 30 MG 24 hr tablet TAKE 1 TABLET DAILY   . loratadine (CLARITIN) 10 MG tablet Take 1 tablet (10 mg total) by mouth daily.   [name] losartan (COZAAR) 100 MG tablet TAKE 1 TABLET DAILY   . metoprolol succinate (TOPROL-XL) 25 MG 24 hr tablet TAKE 1 TABLET DAILY   . mometasone-formoterol (DULERA) 200-5 MCG/ACT AERO Inhale 2 puffs into the lungs 2 (two) times daily.   . nitroGLYCERIN (NITROLINGUAL) 0.4 MG/SPRAY spray Place 1 spray  under the tongue every 5 (five) minutes x 3 doses as needed for chest pain.   . pregabalin (LYRICA) 100 MG capsule TAKE 1 CAPSULE BY MOUTH THREE TIMES DAILY   . rosuvastatin (CRESTOR) 20 MG tablet TAKE ONE TABLET BY MOUTH ONCE DAILY    Facility-Administered Encounter Medications as of 02/25/2018  Medication  . albuterol (PROVENTIL) (5 MG/ML) 0.5% nebulizer solution 2.5 mg    Functional Status:   In your present state of health, do you have any difficulty performing the following activities: 08/24/2017  Hearing? N  Vision? N  Difficulty concentrating or making decisions? Y  Walking or climbing stairs? Y  Dressing or bathing? N  Doing errands, shopping? Y  Comment husband helps  Conservation officer, nature and eating ? Y  Comment husband helps  Using the Toilet? N  In the past six months, have you accidently leaked urine? Y  Comment wears pad  Do you have problems with loss of bowel control? N  Managing your Medications? N  Managing your Finances? N  Housekeeping or managing your Housekeeping? Y  Comment husbands helps  Some recent data might be hidden    Fall/Depression Screening:    Fall Risk  08/24/2017 11/10/2016 07/01/2016  Falls in the past year? Yes No Yes  Comment - - -  Number falls in past yr: 1 - 1  Injury with Fall? - - No  Risk Factor Category  - - -  Risk for fall due to : History of fall(s) - Impaired balance/gait  Risk for fall due to: Comment - - -  Follow up Falls prevention discussed - Falls prevention discussed;Education provided   [facility] 2/9 Scores 08/24/2017 11/10/2016 07/01/2016 02/12/2016 12/03/2015 10/10/2015 10/30/2014  PHQ - 2 Score 3 0 0 3 0 1 0  PHQ- 9 Score 6 - - 7 - - -  Exception Documentation - - - - - - -    Assessment:  Routine home visit   Diabetes- patient reports getting back to checking blood sugars daily and keeping a record, reading this morning 121, denies low blood sugar episodes. Patient reports consistently taking insulin 45 units in the am and 43 in  the PM.  Patient discussed having a bagel for breakfast this morning she will benefit from continued education and support on nutrition and meal planning.  Left great and 3rd toe wound-  Patient able to provide recommended local wound care to toe , applying antibiotic ointment and a bandaid. Diabetic foot care review.  Heart failure - Patient has restarted with monitoring daily weights , today's weight 182 - 181 range no sudden increases noted. Reviewed for worsening HF , yellow zone symptoms denies .  Leg weakness/fall risk - needs reminding for use of walker for safety, has follow  up vascular testing on 10/22 , eager to get return to therapy session once evaluation and improvement of leg weakness. Patient is interesting in attending senior activity also when improvement, placed call to senior center to get patient on list for monthly newsletter of schedule. Patient relies on her husband to assist with transportation when she does not feel like driving.  Patient now has assistance with housekeeping twice monthly.  Medications - Patient voiced being confused at times  about medications due to problems with her memory , she uses a pill organizer, discrepancy when reviewed pills in organizer noting Crestor in morning section instead of brilinta. Patient has new medication order for Vitamin A , 25.000 unit daily  per MD this was written down for patient to pick up when she goes to [facility] she voiced understanding . [name] She  will benefit from [place] consult . Memory issues - recent neurology visit and increase of Aricept, patient has understanding of new dose and placed call to [place] to verify prescription ready.   Plan:  Placed call to [place] regarding refill in lasix, prescriptions needs a renewal they recommended calling PCP office, placed call to Dr. Derrel Nip office representative to follow up on refill. [place] also verifies patient has  Prescription for Aricept ready for  pickup.  [place] consult placed for medication management .  Call to neurology office to confirm testing patient having done - reports nerve conduction study, NCS, and EMG muscle conduction study.   Will plan phone call in the next 2 weeks and home visit in the next month.  Will send PCP visit note.    [facility] CM Care Plan Problem One     Most Recent Value  Care Plan Problem One  Knowledge related to diabetes self care management.   Role Documenting the Problem One  Care Management Coordinator  Care Plan for Problem One  Active  THN Long Term Goal   Patient will report increased knowledge of diabetes self care over the next 60 days  [goal restated. ]  THN Long Term Goal Start Date  02/25/18  Interventions for Problem One Long Term Goal  Advised regarding keeping all medical appointments , reviewed appointments and encouraged attending  all. Pharmacy consult to help with medication management .   THN CM Short Term Goal #1   Patient will report continuing to monitor and record blood sugars twice  daily over next 30 days   [facility] CM Short Term Goal #1 Start Date  02/25/18 [goal reiniated ]  Interventions for Short Term Goal #1  advised regarding keeping daily record of blood sugars readings, how this helps with providing MD for information to manage blood sugar.    [facility] CM Short Term Goal #2   Patient will be able to reports attending all medical appointment over the next 30 days   THN CM Short Term Goal #2 Start Date  01/12/18 [goal date reestablished ]  [facility] CM Short Term Goal #2 Met Date  02/19/18  [facility] CM Short Term Goal #3  Patient will be able to report increase knowledge of balanced meal planning over the next 30 days   [facility] CM Short Term Goal #3 Start Date  02/25/18 Barrie Folk initiated ]  Interventions for Short Tern Goal #3  Reviewed balanced meals , reviewed plate method , including protein in meals , reviewed sample breakfast meals, explained how balanced meals help with controlling  steady blood sugar.   [facility] CM Short Term  Goal #4  Patient will be able to report at least 2 measures diabetes foot care over the next 30 days   [facility] CM Short Term Goal #4 Start Date  02/25/18  Interventions for Short Term Goal #4  Advised regarding taking care of feet importance with Diabetes, washing feet daily, drying completely, reviewed daily wound recommended by podiatry and verifed patient has supplies.       [name], RN, [place] Management Coordinator  [phone]- Mobile [phone]- Toll Free Main Office

## 2018-02-25 NOTE — Telephone Encounter (Signed)
I will cancel the referral to [facility],  But it is unclear from Dr Lannie Fields note WHO  They are referring to in their clinic that will see her ofr her leg problems so I cannot make the referral to [facility] until I get more information from her   Melissa please cancel

## 2018-02-25 NOTE — Telephone Encounter (Signed)
30 day supply of Losartan was sent to pharmacy because pt stated that she would be out of her medication before she received her medications in the mail.

## 2018-02-25 NOTE — Telephone Encounter (Signed)
Requesting refill of lasix;  LRF 06/04/17 #30  0 refills by Dr. Fritzi Mandes. Spoke with pt to verify she was currently taking this, pt has not but unsure if she should be continuing. Med is on pts current med profile. Please advise: [phone]

## 2018-02-25 NOTE — Telephone Encounter (Signed)
[place]  Entry in chart

## 2018-02-26 MED ORDER — FUROSEMIDE 20 MG PO TABS: 20.0000 mg | ORAL_TABLET | ORAL | 0 refills | Status: DC

## 2018-02-26 NOTE — Telephone Encounter (Signed)
It has been cancelled in [facility]

## 2018-02-26 NOTE — Telephone Encounter (Signed)
Refilled: 06/04/2017  By a different provider Last OV: 02/18/2018 Next OV: not scheduled

## 2018-03-01 ENCOUNTER — Other Ambulatory Visit: Payer: Self-pay | Admitting: Pharmacist

## 2018-03-01 NOTE — Patient Outreach (Signed)
[facility] ([facility]) Care Management  [facility] CM Pharmacy   03/01/2018  Zierra L Wadhwa 02/12/1941 [id number]   Reason for referral: Medication management, Medication adherence.  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   Per review of CHL, noted recent neurology visit with increase to Aricept 10mg QHS.    Outreach: Successful call to Ms. Zena Gupta.  HIPAA identifiers verified.   Patient reports she uses 2 pharmacies: mail order via [facility] and [facility].  Patient reports she tries to fill a pillbox herself.  Her husband drives her to [facility] to pick up medications when needed.  Patient thinks that she has her insulin / pen needles and Bonia filled from mail order but is not sure.     Patient admits to missing doses due to confusion with medication manufacturer changes.   Patient and spouse unable to review medications telephonically today as they do not have patient's medication list and unable to look at bottles during our telephone call.  Spouse reports he will look for patient's medication list and call me back.    Objective: Lab Results  Component Value Date   CREATININE 2.12 (H) 02/10/2018   CREATININE 1.56 (H) 09/25/2017   CREATININE 1.48 (H) 06/08/2017    Lab Results  Component Value Date   HGBA1C 7.7 (H) 02/10/2018    Lipid Panel     Component Value Date/Time   CHOL 124 11/15/2016 0540   CHOL 134 03/19/2012 0024   TRIG 175 (H) 11/15/2016 0540   TRIG 110 03/19/2012 0024   HDL 29 (L) 11/15/2016 0540   HDL 41 03/19/2012 0024   CHOLHDL 4.3 11/15/2016 0540   VLDL 35  11/15/2016 0540   VLDL 22 03/19/2012 0024   LDLCALC 60 11/15/2016 0540   LDLCALC 71 03/19/2012 0024   LDLDIRECT 102.0 03/14/2016 1452    BP Readings from Last 3 Encounters:  02/25/18 128/60  02/18/18 136/60  02/10/18 (!) 122/56    Allergies  Allergen Reactions  . Cephalexin Itching  . Contrast Media [Iodinated Diagnostic Agents] Itching  . Doxycycline Itching  . Gabapentin Itching  . Sulfa Antibiotics Itching    Medications Reviewed Today    Reviewed by Glover, Kimberly A, RN (Registered Nurse) on 02/25/18 at 1059  Med List Status: <None>  Medication Order Taking? Sig Documenting Provider Last Dose Status Informant  acetaminophen (TYLENOL) 500 MG tablet [id number] Yes Take 1,000 mg by mouth every 6 (six) hours as needed for mild pain or headache.  [provider] Taking Active Multiple Informants           Med Note (FISHER, KYLE A   Fri Sep 19, 2016  3:15 PM)    ALPRAZolam (XANAX) 0.25 MG tablet [id number]  TAKE 1 TABLET BY MOUTH ONCE DAILY Tullo, Teresa L, MD  Active            Med Note (GLOVER, KIMBERLY A   Wed Sep 23, 2017  1:07 PM) Taking as needed  aspirin 81 MG chewable tablet [id number] Yes Chew 81 mg by mouth daily.  [provider] Taking Active   B-D UF III MINI PEN NEEDLES 31G X 5   MM MISC [id number] Yes USE THREE TIMES A DAY Tullo, Teresa L, MD Taking Active   Beta Carotene (VITAMIN A) 25000 UNIT capsule [id number] Yes Take 1 capsule (25,000 Units total) by mouth daily. Tullo, Teresa L, MD Taking Active   blood glucose meter kit and supplies [id number] Yes Dispense based on patient and insurance preference. Use up to three times daily as directed. (FOR ICD-10 E11.21) Tullo, Teresa L, MD Taking Active   BRILINTA 90 MG TABS tablet [id number] Yes TAKE 1 TABLET TWICE A DAY Tullo, Teresa L, MD Taking Active   docusate sodium (COLACE) 100 MG capsule [id number] Yes Take 100 mg by mouth daily as needed for mild constipation. Reported on 11/09/2015 [provider] Taking Active Multiple Informants           Med Note (GLOVER, KIMBERLY A   Thu Feb 25, 2018 10:52 AM) As needed  donepezil (ARICEPT) 5 MG tablet [id number] Yes Take 1 tablet (5 mg total) by mouth at bedtime. Tullo, Teresa L, MD Taking Active   feeding supplement, GLUCERNA SHAKE, (GLUCERNA SHAKE) LIQD [id number] Yes Take 237 mLs by mouth 2 (two) times daily between meals. Gouru, Aruna, MD Taking Active Multiple Informants  fluticasone (FLONASE) 50 MCG/ACT nasal spray [id number] Yes Place 2 sprays into both nostrils daily as needed for rhinitis. Ramachandran, Pradeep, MD Taking Active   furosemide (LASIX) 20 MG tablet [id number] Yes Take 1 tablet (20 mg total) by mouth every other day. Patel, Sona, MD Taking Active   glucose blood (ACCU-CHEK AVIVA PLUS) test strip [id number] Yes USE TO CHECK GLUCOSE THREE TIMES DAILY Tullo, Teresa L, MD Taking Active   glucose blood test strip [id number] Yes Use to check blood sugars 2 times daily Tullo, Teresa L, MD Taking Active   guaiFENesin (MUCINEX) 600 MG 12 hr tablet [id number] No Take 1 tablet (600 mg total) by mouth 2 (two) times daily.  Patient not taking:  Reported on 02/25/2018   Guse, Lauren M, FNP Not Taking Active   Insulin Lispro Prot & Lispro (HUMALOG MIX 75/25 KWIKPEN) (75-25) 100 UNIT/ML Kwikpen [id number] Yes INJECT 45 TO 60 UNITS TWICE DAILY BEFORE MORNING AND EVENING MEALS Tullo, Teresa L, MD Taking Active   isosorbide mononitrate (IMDUR) 30 MG 24 hr tablet [id number] Yes TAKE 1 TABLET DAILY Tullo, Teresa L, MD Taking Active   loratadine (CLARITIN) 10 MG tablet [id number]  Take 1 tablet (10 mg total) by mouth daily. Guse, Lauren M, FNP  Active   losartan (COZAAR) 100 MG tablet [id number] Yes TAKE 1 TABLET DAILY Tullo, Teresa L, MD Taking Active   metoprolol succinate (TOPROL-XL) 25 MG 24 hr tablet [id number] Yes TAKE 1 TABLET DAILY Tullo, Teresa L, MD Taking Active   mometasone-formoterol (DULERA) 200-5 MCG/ACT AERO [id number]  Inhale 2 puffs into the  lungs 2 (two) times daily. Ramachandran, Pradeep, MD  Active   nitroGLYCERIN (NITROLINGUAL) 0.4 MG/SPRAY spray [id number] Yes Place 1 spray under the tongue every 5 (five) minutes x 3 doses as needed for chest pain. Tullo, Teresa L, MD Taking Active Multiple Informants  pregabalin (LYRICA) 100 MG capsule [id number] Yes TAKE 1 CAPSULE BY MOUTH THREE TIMES DAILY Tullo, Teresa L, MD Taking Active   rosuvastatin (CRESTOR) 20 MG tablet [id number] Yes TAKE ONE TABLET BY MOUTH ONCE DAILY Tullo, Teresa L, MD Taking Active           Assessment:  Drugs sorted by system:  Neurologic/Psychologic: alprazolam, donepezil, pregabalin  Cardiovascular: aspirin 81mg, furosemide, ticagrelor, isosorbide mononitrate, losartan, metoprolol, rosuvastatin,   SL NTG  Pulmonary/Allergy: albuterol nebulizer, mometasone-formoterol inhaler, fluticasone NS, guaifenesin, loratadine  Gastrointestinal: docusate, feeding supplement (glucerna)  Endocrine: humalog mix 72/25  Pain: acetaminophen  Vitamins/Minerals/Supplements: Vitamin A  Medication Review Findings:  . Unable to review medications with patient today.  Unclear what patient is actually taking.   . Donepezil dose in CHL not updated yet to 21m - patient unsure if she has picked up increased dose of medication . Poor medication adherence per RN report and patient report.  o I reviewed benefits of compliance packaging from local pharmacy which also offers free delivery.  Patient and spouse agreeable to transfer medications to [place]  Patient requests that insulin and pen needles continue to come from mail order.  Patient and spouse will call me back to confirm current medications.   [facility]  Staff will contact Ms. Rape to schedule appointment to review compliance packs and request family to drop off extra bottles.   oArlina Robesmessage sent to Dr. TDerrel Nip(PCP) and Dr. GRockey Situ(Cardiologist) and telephone call placed to PA AChipper Herb(Neurology) [facility]   Plan: -Await call back from patient and spouse to review medications / diabetic testing supplies.  We can find out if these can come from mail order once brand name confirmed.  I will follow-up on Wednesday if no response back yet.  [facility]    CRalene Bathe PharmD, [phone]

## 2018-03-02 ENCOUNTER — Encounter: Payer: Medicare Other | Admitting: Physical Therapy

## 2018-03-02 ENCOUNTER — Encounter

## 2018-03-02 ENCOUNTER — Ambulatory Visit: Payer: Medicare Other | Admitting: Internal Medicine

## 2018-03-02 DIAGNOSIS — M545 Low back pain: Secondary | ICD-10-CM | POA: Diagnosis not present

## 2018-03-02 DIAGNOSIS — R202 Paresthesia of skin: Secondary | ICD-10-CM | POA: Diagnosis not present

## 2018-03-02 DIAGNOSIS — M79671 Pain in right foot: Secondary | ICD-10-CM | POA: Diagnosis not present

## 2018-03-02 DIAGNOSIS — R200 Anesthesia of skin: Secondary | ICD-10-CM | POA: Diagnosis not present

## 2018-03-02 DIAGNOSIS — M79672 Pain in left foot: Secondary | ICD-10-CM | POA: Diagnosis not present

## 2018-03-03 ENCOUNTER — Telehealth: Payer: Self-pay | Admitting: Internal Medicine

## 2018-03-03 ENCOUNTER — Telehealth: Payer: Self-pay | Admitting: *Deleted

## 2018-03-03 ENCOUNTER — Other Ambulatory Visit: Payer: Self-pay | Admitting: Pharmacist

## 2018-03-03 DIAGNOSIS — I255 Ischemic cardiomyopathy: Secondary | ICD-10-CM

## 2018-03-03 MED ORDER — ISOSORBIDE MONONITRATE ER 30 MG PO TB24: 30.0000 mg | ORAL_TABLET | Freq: Every day | ORAL | 2 refills | Status: AC

## 2018-03-03 MED ORDER — PREGABALIN 100 MG PO CAPS: 100.0000 mg | ORAL_CAPSULE | Freq: Three times a day (TID) | ORAL | 5 refills | Status: DC

## 2018-03-03 MED ORDER — ROSUVASTATIN CALCIUM 20 MG PO TABS: 20.0000 mg | ORAL_TABLET | Freq: Every day | ORAL | 2 refills | Status: DC

## 2018-03-03 MED ORDER — ALPRAZOLAM 0.25 MG PO TABS: 0.2500 mg | ORAL_TABLET | Freq: Every day | ORAL | 5 refills | Status: DC

## 2018-03-03 MED ORDER — METOPROLOL SUCCINATE ER 25 MG PO TB24: 25.0000 mg | ORAL_TABLET | Freq: Every day | ORAL | 2 refills | Status: DC

## 2018-03-03 MED ORDER — LOSARTAN POTASSIUM 100 MG PO TABS: 100.0000 mg | ORAL_TABLET | Freq: Every day | ORAL | 2 refills | Status: DC

## 2018-03-03 MED ORDER — FUROSEMIDE 20 MG PO TABS: 20.0000 mg | ORAL_TABLET | ORAL | 2 refills | Status: DC

## 2018-03-03 MED ORDER — BRILINTA 90 MG PO TABS: 90.0000 mg | ORAL_TABLET | Freq: Two times a day (BID) | ORAL | 2 refills | Status: DC

## 2018-03-03 NOTE — Addendum Note (Signed)
Addended by: [name] on: 03/03/2018 03:29 PM   Modules accepted: Orders

## 2018-03-03 NOTE — Telephone Encounter (Signed)
Copied from [place] [id number]. Topic: Quick Communication - See Telephone Encounter >> Mar 03, 2018 11:13 AM Gardiner Ramus wrote: CRM for notification. See Telephone encounter for: 03/03/18. Coleen with [facility] called and stated that patient had an hypoglycemic issue. Pt said that she skipped dinner /took insulin and cbg went down to 58. Coleen also stated that pt has changed her pharmacy to [place], [place] - [place] [phone] (Phone) [phone] (Fax). Could we please send over all medication prescribed by dr Derrel Nip over to them. Will help with the patients cost. Please advise. Coleen [phone]

## 2018-03-03 NOTE — Telephone Encounter (Signed)
-----   Message from Minna Merritts, MD sent at 03/02/2018  9:46 PM EDT -----  Can we send in new scripts thx TG  ----- Message ----- From: Rudean Haskell, [facility] Sent: 03/01/2018   4:05 PM EDT To: [name], MD, Minna Merritts, MD  Hi Dr. Derrel Nip and Dr. Rockey Situ,  I was referred to reach out to your patient, Pamela Collier, to assist with medication adherence.  Per patient report and RN report, patient has been filling her own pillbox but has been making mistakes when filling, is forgetting to take medication, and forgetting to call in refills.  Patient agreeable to start using compliance packaging (+ free delivery) through [facility].    I have updated [facility] as preferred pharmacy in [place].  Can you both please send in new prescriptions for any active medications prescribed by you to [facility] [place] so they will have an up-to-date list?  I am also contacting neurology office.    Thanks so much for your help.  Ralene Bathe, PharmD, [place] [phone]

## 2018-03-03 NOTE — Telephone Encounter (Signed)
Refills for Brilinta, furosemide, Imdur, metoprolol succinate, rosuvastatin, and losartan sent to [facility].

## 2018-03-03 NOTE — Patient Outreach (Signed)
[facility]) Care Management  [name] 03/03/2018  Pamela Collier 19-Jan-1941 [id number]   Reason for referral: Medication management, Medication adherence.  Per notes, patient forgets to call in refills and several missed doses noted  Referral source: [facility] RN Landis Martins Current insurance: BC[employer] Medicare Part D  Outreach: Successful call to [name].  Patient reports that she recently had a hypoglycemia event on Monday evening and states "I almost went into a diabetic coma."  She reports she did not eat dinner and took the usual 60 units of Humalog 75/25 and CBG dropped to 58.  Patient has not called PCP yet but states she is going to do this today.    Medications reviewed telephonically.   Medications Reviewed Today    Reviewed by Rudean Haskell, RPH (Pharmacist) on 03/03/18 at 1035  Med List Status: <None>  Medication Order Taking? Sig Documenting Provider Last Dose Status Informant  acetaminophen (TYLENOL) 500 MG tablet [id number] Yes Take 1,000 mg by mouth every 6 (six) hours as needed for mild pain or headache.  [provider] Taking Active Multiple Informants           Med Note Jilda Roche A   Fri Sep 19, 2016  3:15 PM)    ALPRAZolam [name]) 0.25 MG tablet [id number] Yes TAKE 1 TABLET BY MOUTH ONCE DAILY [name], MD Taking Active            Med Note Iva Lento, Octavio Graves   Wed Mar 03, 2018 10:15 AM)    aspirin 81 MG chewable tablet [id number] Yes Chew 81 mg by mouth daily.  [provider] Taking Active   B-D UF III MINI PEN NEEDLES 31G X 5 MM MISC [id number] Yes USE THREE TIMES A DAY [name], MD Taking Active   Beta Carotene (VITAMIN A) 25000 UNIT capsule [id number] Yes Take 1 capsule (25,000 Units total) by mouth daily. [name], MD Taking Active   blood glucose meter kit and supplies [id number] Yes Dispense based on patient and insurance preference. Use up to three times daily as directed. (FOR ICD-10 E11.21)  [name], MD Taking Active   BRILINTA 90 MG TABS tablet [id number] Yes Take 1 tablet (90 mg total) by mouth 2 (two) times daily. Minna Merritts, MD Taking Active   docusate sodium (COLACE) 100 MG capsule [id number] Yes Take 100 mg by mouth daily as needed for mild constipation. Reported on 11/09/2015 [provider] Taking Active Multiple Informants           Med Note Iva Lento, Derricka Mertz E   Wed Mar 03, 2018 10:16 AM)    donepezil (ARICEPT) 10 MG tablet [id number] Yes Take 10 mg by mouth at bedtime. [provider] Taking Active   feeding supplement, [place], [place] LIQD [id number] Yes Take 237 mLs by mouth 2 (two) times daily between meals. Nicholes Mango, MD Taking Active Multiple Informants           Med Note Iva Lento, Octavio Graves   Wed Mar 03, 2018 10:18 AM) Dewaine Conger once a day due to cost  fluticasone (FLONASE) 50 MCG/ACT nasal spray [id number] Yes Place 2 sprays into both nostrils daily as needed for rhinitis. [name], MD Taking Active   furosemide (LASIX) 20 MG tablet [id number] Yes Take 1 tablet (20 mg total) by mouth every other day. Minna Merritts, MD Taking Active   glucose blood (ACCU-CHEK AVIVA PLUS) test strip [id number] Yes USE TO CHECK  GLUCOSE THREE TIMES DAILY [name], MD Taking Active   glucose blood test strip [id number] Yes Use to check blood sugars 2 times daily [name], MD Taking Active   Insulin Lispro Prot & Lispro (HUMALOG MIX 75/25 KWIKPEN) (75-25) 100 UNIT/ML Claiborne Rigg [id number] Yes INJECT 45 TO 60 UNITS TWICE DAILY BEFORE MORNING AND EVENING MEALS  Patient taking differently:  45-60 Units. INJECT 45 TO 60 UNITS TWICE DAILY BEFORE MORNING AND EVENING MEALS   [name], MD Taking Active Self  isosorbide mononitrate (IMDUR) 30 MG 24 hr tablet [id number] Yes Take 1 tablet (30 mg total) by mouth daily. Minna Merritts, MD Taking Active        Patient not taking:       Discontinued 03/03/18 1027 (Completed Course)    losartan (COZAAR) 100 MG tablet [id number] Yes Take 1 tablet (100 mg total) by mouth daily. Minna Merritts, MD Taking Active   metoprolol succinate (TOPROL-XL) 25 MG 24 hr tablet [id number] Yes Take 1 tablet (25 mg total) by mouth daily. Minna Merritts, MD Taking Active   mometasone-formoterol [facility]) 200-5 MCG/ACT Hollie Salk [id number] Yes Inhale 2 puffs into the lungs 2 (two) times daily. [name], MD Taking Active   nitroGLYCERIN (NITROLINGUAL) 0.4 MG/SPRAY spray [id number] Yes Place 1 spray under the tongue every 5 (five) minutes x 3 doses as needed for chest pain. [name], MD Taking Active Multiple Informants  pregabalin (LYRICA) 100 MG capsule [id number] Yes TAKE 1 CAPSULE BY MOUTH THREE TIMES DAILY [name], MD Taking Active   rosuvastatin (CRESTOR) 20 MG tablet [id number] Yes Take 1 tablet (20 mg total) by mouth daily. Minna Merritts, MD Taking Active           3-way call to [facility].    Brilinta - shipped 03/01/18.    Medications to continue on automatic refills: Pen needles, insulin  Medications de-activated: Losartan, metoprolol, isosorbide mononitrate, rosuvastatin  Accu-check Aviva Plus test strips + lancets: Not covered via [employer] plan.  Patient will need to continue to buy these from [facility].   No OTC benefit from plan.    Drugs sorted by system:  Neurologic/Psychologic: alprazolam, donepezil, pregabalin  Cardiovascular: aspirin 68m, ticagrelor, furosemide, isosorbide mononitrate, losartan, metoprolol, rosuvastatin, SL NTG  Pulmonary/Allergy: albuterol, fluticasone NS, mometasone-formoterol  Gastrointestinal: docusate, glucerna  Endocrine: humalog mix 75/25  Pain: acetaminophen  Vitamins/Minerals/Supplements: vitamin A  Medication Review Findings:   Alprazolam: Identified in the Beers Criteria as a potentially inappropriate medication to be avoided in patients 65 years and older (independent of diagnosis or condition) due to  increased risk of impaired cognition, delirium, falls, fractures, and motor vehicle accidents with benzodiazepine use. Per Beers list, this medication should be avoided in elderly patients with dementia or cognitive impairment because of adverse CNS effects.  Consider changing to shorter acting benzodiazepine (lorazepam, oxazepam, temazepam) as clinically warranted.  . SCr elevated 02/10/18 to 2.12 (CrCl estimated ~30 ml/min)  o Crestor: Max dose recommended per package insert with CrCL < 380mmin is Crestor 1038mConsider reducing to 22m21m clinically warranted.  o Lyrica:  Dosing frequency recommended per package insert with CrCl 15-30 ml/min is 1-2 divided doses.  Consider reducing frequency as clinically warranted.   . Hypoglycemic event 2 days ago, CBG in 50s.48soted recurrent issue per PCP notes.    Plan: Message left for Dr. TullDerrel Niparding hypoglycemic event and need [facility]  Will follow-up in 2 days   Ralene Bathe, PharmD, [place] [phone]

## 2018-03-03 NOTE — Telephone Encounter (Signed)
Alprazolam and lyrica sent to [facility]   Forwarding to Dr Rockey Situ

## 2018-03-03 NOTE — Telephone Encounter (Signed)
[facility] has called and stated that the pt has changed pharmacies to [facility] and is wanting all medications prescribed by you to be sent in. The only three that I see prescribed by you are Alprazolam, Humalog and Lyrica. The rest seems to be prescribed by Dr. Rockey Situ. The Humalog has been refilled the Alprazolam and Lyrica still need sending to new pharmacy. [facility] nurse also wanted to let you know that the pt had a hypoglycemic episode. She stated that the pt didn't eat supper and still took her insulin resulting in her having a cbg of 58.

## 2018-03-04 ENCOUNTER — Encounter: Payer: Medicare Other | Admitting: Physical Therapy

## 2018-03-04 DIAGNOSIS — R809 Proteinuria, unspecified: Secondary | ICD-10-CM | POA: Diagnosis not present

## 2018-03-04 DIAGNOSIS — R319 Hematuria, unspecified: Secondary | ICD-10-CM | POA: Diagnosis not present

## 2018-03-04 DIAGNOSIS — N183 Chronic kidney disease, stage 3 (moderate): Secondary | ICD-10-CM | POA: Diagnosis not present

## 2018-03-04 DIAGNOSIS — I129 Hypertensive chronic kidney disease with stage 1 through stage 4 chronic kidney disease, or unspecified chronic kidney disease: Secondary | ICD-10-CM | POA: Diagnosis not present

## 2018-03-04 DIAGNOSIS — E1122 Type 2 diabetes mellitus with diabetic chronic kidney disease: Secondary | ICD-10-CM | POA: Diagnosis not present

## 2018-03-08 ENCOUNTER — Telehealth: Payer: Self-pay

## 2018-03-08 NOTE — Telephone Encounter (Signed)
Pt is scheduled for 03/10/2018 @ 11:30am. Pt is aware of appt date and time.

## 2018-03-08 NOTE — Telephone Encounter (Signed)
Copied from [place] [id number]. Topic: Appointment Scheduling - Scheduling Inquiry for Clinic >> Mar 08, 2018  1:00 PM Percell Belt A wrote: Reason for CRM:   Pt called in and stated that she would like to know if Dr Derrel Nip would work her in anytime soon.  She is having Diarrhea off and on and would like to be seen.  She want to see Dr Derrel Nip only. Please advise   Best call back number  [phone]

## 2018-03-09 ENCOUNTER — Ambulatory Visit: Payer: Medicare Other | Admitting: Physical Therapy

## 2018-03-10 ENCOUNTER — Ambulatory Visit: Payer: Medicare Other | Admitting: Internal Medicine

## 2018-03-10 ENCOUNTER — Ambulatory Visit (INDEPENDENT_AMBULATORY_CARE_PROVIDER_SITE_OTHER): Payer: Medicare Other | Admitting: Internal Medicine

## 2018-03-10 ENCOUNTER — Encounter: Payer: Self-pay | Admitting: Internal Medicine

## 2018-03-10 VITALS — BP 136/56 | HR 65 | Temp 98.2°F | Resp 17 | Ht 67.0 in | Wt 188.6 lb

## 2018-03-10 DIAGNOSIS — R197 Diarrhea, unspecified: Secondary | ICD-10-CM | POA: Diagnosis not present

## 2018-03-10 DIAGNOSIS — K591 Functional diarrhea: Secondary | ICD-10-CM

## 2018-03-10 DIAGNOSIS — E1142 Type 2 diabetes mellitus with diabetic polyneuropathy: Secondary | ICD-10-CM

## 2018-03-10 DIAGNOSIS — I255 Ischemic cardiomyopathy: Secondary | ICD-10-CM | POA: Diagnosis not present

## 2018-03-10 MED ORDER — DICYCLOMINE HCL 10 MG PO CAPS: 10.0000 mg | ORAL_CAPSULE | Freq: Three times a day (TID) | ORAL | 0 refills | Status: DC

## 2018-03-10 NOTE — Patient Instructions (Signed)
Please collect your next runny stool in the vials provided so we can rule out infection  I think your diarrhea may be due to something you are eating.  So make a food record of whatever you ate the day before the diarrhea starts so we can see if there is a recurring dietary  food that is causing it  I am prescribing dicyclomine,  An antispasmodic to take the next tine you have the cramping

## 2018-03-10 NOTE — Progress Notes (Signed)
Subjective:  Patient ID: Pamela Collier, female    DOB: 02/05/1941  Age: 77 y.o. MRN: [id number]  CC: The primary encounter diagnosis was Diarrhea of presumed infectious origin. Diagnoses of Functional diarrhea and Diabetic polyneuropathy associated with type 2 diabetes mellitus ([place]) were also pertinent to this visit.  HPI: Pamela Collier presents for evaluation  and treatment of diarrhea  Patient states that the loose stools occur every 4o or 5 days ,  Averaging about  6 times per month.  Stools become runny and  Uncontrolled with multiple stools for 1-2 days,  The resolves. Accompanied by abdominal cramping that is not always relieved by defecation.  No fevers.  Feels bloated all the time .  Has been present for the past 2 months , at least.   However,  review of colonoscopy report from 2016  Notes that patient was having chronic diarrhea then. No unintentional weight loss.  Denies nausea.  Appetite good.  Having Solid stools in between.   Not constipated.Has not kept a food diary to see if it is related to certain foods.    She continues to have Leg pain:  She has known bilateral PAD ( seen by [facility] Dec [facility]) with prior vascular interventions and toe amputation.  lst ABIs were in 2017 by [facility].  Left 1.07 and right 1.3.  She is considered a poor candidate for invasive procedures by Dr  Oneida Alar due to her medical comorbidities.   She has chronic diabetic Neuropathy  Managed with  lyrica 100 mg tid  Has been having episodes of rapid heart  Rate but has not contacted her cardiologist.    Outpatient Medications Prior to Visit  Medication Sig Dispense Refill  . ACCU-CHEK SOFTCLIX LANCETS lancets USE UP TO THREE TIMES DAILY AS DIRECTED  0  . acetaminophen (TYLENOL) 500 MG tablet Take 1,000 mg by mouth every 6 (six) hours as needed for mild pain or headache.     . ALPRAZolam (XANAX) 0.25 MG tablet Take 1 tablet (0.25 mg total) by mouth daily. 30 tablet 5  . aspirin 81 MG chewable tablet  Chew 81 mg by mouth daily.     . B-D UF III MINI PEN NEEDLES 31G X 5 MM MISC USE THREE TIMES A DAY 270 each 4  . Beta Carotene (VITAMIN A) 25000 UNIT capsule Take 1 capsule (25,000 Units total) by mouth daily. 90 capsule 1  . blood glucose meter kit and supplies Dispense based on patient and insurance preference. Use up to three times daily as directed. (FOR ICD-10 E11.21) 1 each 0  . BRILINTA 90 MG TABS tablet Take 1 tablet (90 mg total) by mouth 2 (two) times daily. 180 tablet 2  . docusate sodium (COLACE) 100 MG capsule Take 100 mg by mouth daily as needed for mild constipation. Reported on 11/09/2015    . donepezil (ARICEPT) 10 MG tablet Take 10 mg by mouth at bedtime.    . feeding supplement, GLUCERNA SHAKE, (GLUCERNA SHAKE) LIQD Take 237 mLs by mouth 2 (two) times daily between meals. 60 Can 0  . fluticasone (FLONASE) 50 MCG/ACT nasal spray Place 2 sprays into both nostrils daily as needed for rhinitis. 16 g 5  . furosemide (LASIX) 20 MG tablet Take 1 tablet (20 mg total) by mouth every other day. 90 tablet 2  . glucose blood (ACCU-CHEK AVIVA PLUS) test strip USE TO CHECK GLUCOSE THREE TIMES DAILY 200 each 5  . glucose blood test strip Use to check blood  sugars 2 times daily 100 each 12  . Insulin Lispro Prot & Lispro (HUMALOG MIX 75/25 KWIKPEN) (75-25) 100 UNIT/ML Kwikpen INJECT 45 TO 60 UNITS TWICE DAILY BEFORE MORNING AND EVENING MEALS (Patient taking differently: 45-60 Units. INJECT 45 TO 60 UNITS TWICE DAILY BEFORE MORNING AND EVENING MEALS) 105 mL 4  . isosorbide mononitrate (IMDUR) 30 MG 24 hr tablet Take 1 tablet (30 mg total) by mouth daily. 90 tablet 2  . losartan (COZAAR) 100 MG tablet Take 1 tablet (100 mg total) by mouth daily. 90 tablet 2  . metoprolol succinate (TOPROL-XL) 25 MG 24 hr tablet Take 1 tablet (25 mg total) by mouth daily. 90 tablet 2  . mometasone-formoterol (DULERA) 200-5 MCG/ACT AERO Inhale 2 puffs into the lungs 2 (two) times daily. 13 g 3  . nitroGLYCERIN  (NITROLINGUAL) 0.4 MG/SPRAY spray Place 1 spray under the tongue every 5 (five) minutes x 3 doses as needed for chest pain. 12 g 12  . pregabalin (LYRICA) 100 MG capsule Take 1 capsule (100 mg total) by mouth 3 (three) times daily. 90 capsule 5  . rosuvastatin (CRESTOR) 20 MG tablet Take 1 tablet (20 mg total) by mouth daily. 90 tablet 2   Facility-Administered Medications Prior to Visit  Medication Dose Route Frequency Provider Last Rate Last Dose  . albuterol (PROVENTIL) (5 MG/ML) 0.5% nebulizer solution 2.5 mg  2.5 mg Nebulization Once [name], MD        Review of Systems;  Patient denies headache, fevers, malaise, unintentional weight loss, skin rash, eye pain, sinus congestion and sinus pain, sore throat, dysphagia,  hemoptysis , cough, dyspnea, wheezing, chest pain, palpitations, orthopnea, edema, abdominal pain, nausea, melena, diarrhea, constipation, flank pain, dysuria, hematuria, urinary  Frequency, nocturia, numbness, tingling, seizures,  Focal weakness, Loss of consciousness,  Tremor, insomnia, depression, anxiety, and suicidal ideation.      Objective:  BP (!) 136/56 (BP Location: Left Arm, Patient Position: Sitting, Cuff Size: Normal)   Pulse 65   Temp 98.2 F (36.8 C) (Oral)   Resp 17   Ht _0  (1.702 m)   Wt 188 lb 9.6 oz (85.5 kg)   SpO2 99%   BMI 29.54 kg/m   BP Readings from Last 3 Encounters:  03/10/18 (!) 136/56  02/25/18 128/60  02/18/18 136/60    Wt Readings from Last 3 Encounters:  03/10/18 188 lb 9.6 oz (85.5 kg)  02/25/18 183 lb (83 kg)  02/18/18 187 lb 6.4 oz (85 kg)    General appearance: alert, cooperative and appears stated age Ears: normal TM's and external ear canals both ears Throat: lips, mucosa, and tongue normal; teeth and gums normal Neck: no adenopathy, no carotid bruit, supple, symmetrical, trachea midline and thyroid not enlarged, symmetric, no tenderness/mass/nodules Back: symmetric, no curvature. ROM normal. No CVA  tenderness. Lungs: clear to auscultation bilaterally Heart: regular rate and rhythm, S1, S2 normal, no murmur, click, rub or gallop Abdomen: soft, non-tender; bowel sounds normal; no masses,  no organomegaly Pulses: 2+ and symmetric Skin: Skin color, texture, turgor normal. No rashes or lesions Lymph nodes: Cervical, supraclavicular, and axillary nodes normal.  Lab Results  Component Value Date   HGBA1C 7.7 (H) 02/10/2018   HGBA1C 7.7 (H) 09/25/2017   HGBA1C 7.5 (H) 06/08/2017    Lab Results  Component Value Date   CREATININE 2.12 (H) 02/10/2018   CREATININE 1.56 (H) 09/25/2017   CREATININE 1.48 (H) 06/08/2017    Lab Results  Component Value Date   WBC  8.3 02/16/2018   HGB 10.5 (L) 02/16/2018   HCT 31.6 (L) 02/16/2018   PLT 264 02/16/2018   GLUCOSE 64 (L) 02/10/2018   CHOL 124 11/15/2016   TRIG 175 (H) 11/15/2016   HDL 29 (L) 11/15/2016   LDLDIRECT 102.0 03/14/2016   LDLCALC 60 11/15/2016   ALT 13 09/25/2017   AST 19 09/25/2017   NA 136 02/10/2018   K 4.2 02/10/2018   CL 104 02/10/2018   CREATININE 2.12 (H) 02/10/2018   BUN 38 (H) 02/10/2018   CO2 25 02/10/2018   TSH 1.23 02/18/2018   INR 1.2 (H) 02/02/2015   HGBA1C 7.7 (H) 02/10/2018   MICROALBUR 28.9 (H) 09/26/2015    Dg Bone Density  Result Date: 11/02/2017 EXAM: DUAL X-RAY ABSORPTIOMETRY (DXA) FOR BONE MINERAL DENSITY IMPRESSION: Dear Dr Derrel Nip, Your patient Pamela Collier completed a BMD test on 11/02/2017 using the [place] (analysis version: 14.10) manufactured by [employer]. The following summarizes the results of our evaluation. PATIENT BIOGRAPHICAL: Name: Pamela, Collier Patient ID: [id number] Birth Date: 17-Dec-1940 Height: 65.0 in. Gender: Female Exam Date: 11/02/2017 Weight: 186.0 lbs. Indications: Advanced Age, History of Spinal Surgery, Hysterectomy, Oophorectomy Bilateral, Osteopenia, Postmenopausal, History of Fracture (Adult) Fractures: Ankle Treatments: ASPRIN 81 MG, Flonase, Prilosec  ASSESSMENT: The BMD measured at Femur Neck Right is 0.822 g/cm2 with a T-score of -1.6. This patient is considered osteopenic according to [facility]) criteria. Site Region Measured Measured WHO Young Adult BMD Date       Age      Classification T-score AP Spine L1-L4 11/02/2017 77.5 Normal 0.5 1.251 g/cm2 DualFemur Neck Right 11/02/2017 77.5 Osteopenia -1.6 0.822 g/cm2 DualFemur Total Mean 11/02/2017 77.5 Normal -0.6 0.933 g/cm2 World Health Organization [facility]) criteria for post-menopausal, Caucasian Women: Normal:       T-score at or above -1 SD Osteopenia:   T-score between -1 and -2.5 SD Osteoporosis: T-score at or below -2.5 SD RECOMMENDATIONS: 1. All patients should optimize calcium and vitamin D intake. 2. Consider FDA-approved medical therapies in postmenopausal women and men aged 71 years and older, based on the following: a. A hip or vertebral(clinical or morphometric) fracture b. T-score < -2.5 at the femoral neck or spine after appropriate evaluation to exclude secondary causes c. Low bone mass (T-score between -1.0 and -2.5 at the femoral neck or spine) and a 10-year probability of a hip fracture > 3% or a 10-year probability of a major osteoporosis-related fracture > 20% based on the [place]-adapted WHO algorithm d. Clinician judgment and/or patient preferences may indicate treatment for people with 10-year fracture probabilities above or below these levels FOLLOW-UP: Patients with diagnosis of osteoporosis or at high risk for fracture should have regular bone mineral density tests. For patients eligible for Medicare, routine testing is allowed once every 2 years. The testing frequency can be increased to one year for patients who have rapidly progressing disease, those who are receiving or discontinuing medical therapy to restore bone mass, or have additional risk factors. FRAX* RESULTS:  (version: 3.5) 10-year Probability of Fracture1 Major Osteoporotic Fracture2 Hip Fracture 8.3% 1.6%  Population: [place] (Black) Risk Factors: History of Fracture (Adult) Based on Femur (Right) Neck BMD 1 -The 10-year probability of fracture may be lower than reported if the patient has received treatment. 2 -Major Osteoporotic Fracture: Clinical Spine, Forearm, Hip or Shoulder *FRAX is a Materials engineer of the [employer] for Metabolic Bone Disease, a [place] (WHO) [facility]. ASSESSMENT:  The probability of a major osteoporotic fracture is 8.3 % within the next ten years. The probability of a hip fracture is 1.6 % within the next ten years. Electronically Signed   By: Earle Gell M.D.   On: 11/02/2017 14:18    Assessment & Plan:   Problem List Items Addressed This Visit    Diabetic neuropathy ([place])    Her persistent leg pain may be multifactorial  ; it is unclear if she has had follow up on known PAD since Dec 2017 with [facility].       Functional diarrhea    Suspected by history.  Patient is asked to keep a food diary and to submit samples of stool for testing to rule out infectious causes .  Trial of bentyl       Other Visit Diagnoses    Diarrhea of presumed infectious origin    -  Primary   Relevant Orders   GI pathogen panel by PCR, stool   Stool culture   Clostridium difficile Toxin B, Qualitative, Real-Time PCR([facility])    A total of 25 minutes of face to face time was spent with patient more than half of which was spent in counselling and coordination of care   I am having Pamela Collier start on dicyclomine. I am also having her maintain her acetaminophen, docusate sodium, nitroGLYCERIN, feeding supplement (GLUCERNA SHAKE), aspirin, fluticasone, Insulin Lispro Prot & Lispro, mometasone-formoterol, B-D UF III MINI PEN NEEDLES, glucose blood, glucose blood, blood glucose meter kit and supplies, vitamin A, metoprolol succinate, losartan, furosemide, BRILINTA, isosorbide mononitrate, rosuvastatin, donepezil, ALPRAZolam, pregabalin, and  ACCU-CHEK SOFTCLIX LANCETS.  Meds ordered this encounter  Medications  . dicyclomine (BENTYL) 10 MG capsule    Sig: Take 1 capsule (10 mg total) by mouth 4 (four) times daily -  before meals and at bedtime.    Dispense:  120 capsule    Refill:  0    There are no discontinued medications.  Follow-up: Return in about 4 weeks (around 04/07/2018).   [name], MD

## 2018-03-11 ENCOUNTER — Other Ambulatory Visit: Payer: Self-pay | Admitting: *Deleted

## 2018-03-11 NOTE — Patient Outreach (Signed)
[facility]) Care Management  03/11/2018  Pamela Collier [birth year] [id number]  Telephone follow up call   Successful telephone outreach to patient , HIPAA verified.  Patient discussed her recent problem with diarrhea ,and visit to PCP office.  Patient reports having at least 2 stools a day. Patient reports that she unable to recall eating anything different to cause diarrhea. Patient stats she was advised to keep a record of what she eats to maybe identify if any food may be causing .Patient reports that due to her memory she keeps forgetting to collect specimen to take to doctors office, discussed keeping specimen container in bathroom as a reminder .     Diabetes Patient reports continuing to monitor blood sugars daily, and keeping a record, she denies having a low blood sugar reading reports blood sugar range of 85- 180. Patient able to teachback low blood sugar episodes and how to treat.  Medications  Patient reports that she now has bubble  packaged medications ,she discussed that this is working out better , all she has to do is punch pills out, works better for her due to memory problems.  Leg discomfort Completed testing a neurology office , patient discussed only recommendation was to do exercise , leg lifts. Patient discussed wondering whether she needs some kind of medication for the neuropathy and she plans to discuss with Dr.Tullo regarding getting another opinion. Patient discussed that she continues to have cramping discomfort at times in her legs.    Plan  Will plan follow up home visit in the next 2 weeks.  Reinforced with patient to collect specimen as recommended by PCP . Fall precautions reviewed.     THN CM Care Plan Problem One     Most Recent Value  Care Plan Problem One  Knowledge related to diabetes self care management.   (Pended)   Role Documenting the Problem One  Care Management Coordinator  (Pended)   [place] for Problem One   Active  (Pended)   THN Long Term Goal   Patient will report increased knowledge of diabetes self care over the next 60 days   (Pended)  [goal restated. ]  THN Long Term Goal Start Date  02/25/18  (Pended)   THN CM Short Term Goal #1   Patient will report continuing to monitor and record blood sugars twice  daily over next 30 days   (Pended)   THN CM Short Term Goal #1 Start Date  02/25/18  (Pended)  [goal reiniated ]  Interventions for Short Term Goal #1  Reviewed recent readings, reinforced continuing to monitor and keep a record of blood sugars.   (Pended)   THN CM Short Term Goal #2   Patient will be able to reports attending all medical appointment over the next 30 days   (Pended)   THN CM Short Term Goal #2 Start Date  01/12/18  (Pended)  [goal date reestablished ]  THN CM Short Term Goal #2 Met Date  02/19/18  (Pended)   THN CM Short Term Goal #3  Patient will be able to report increase knowledge of balanced meal planning over the next 30 days   (Pended)   THN CM Short Term Goal #3 Start Date  02/25/18  (Pended)  [goal initiated ]  THN CM Short Term Goal #4  Patient will be able to report at least 2 measures diabetes foot care over the next 30 days   (Pended)   THN CM Short Term  Goal #4 Start Date  02/25/18  (Pended)   Interventions for Short Term Goal #4  Advised regarding taking care of feet importance with Diabetes, washing feet daily, drying completely, reviewed daily wound recommended by podiatry and verifed patient has supplies.   (Pended)      [name], RN, [place] Management Coordinator  [phone]- Mobile [phone]- Toll Free Main Office

## 2018-03-12 ENCOUNTER — Telehealth: Payer: Self-pay | Admitting: *Deleted

## 2018-03-12 DIAGNOSIS — D869 Sarcoidosis, unspecified: Secondary | ICD-10-CM

## 2018-03-12 NOTE — Telephone Encounter (Signed)
-----   Message from [name], MD sent at 03/05/2018  4:59 PM EDT ----- Looks like she is overdue for her follow up. [name], can you please bring her back in with a repeat ACE level and comprehensive metabolic panel, thanks.  ----- Message ----- From: [name], MD Sent: 02/11/2018   2:58 PM EDT To: [name], MD  I am concerned that her Labs and chest x ray findings are  suggestive of return of sarcoid?  add'l labs ordered,

## 2018-03-12 NOTE — Telephone Encounter (Signed)
Attempted to contact patient. No answer nor vmail.  Needs f/u appt with DR with labs.

## 2018-03-13 DIAGNOSIS — K591 Functional diarrhea: Secondary | ICD-10-CM | POA: Insufficient documentation

## 2018-03-13 NOTE — Assessment & Plan Note (Addendum)
Suspected by history.  Patient is asked to keep a food diary and to submit samples of stool for testing to rule out infectious causes .  Trial of bentyl

## 2018-03-13 NOTE — Assessment & Plan Note (Signed)
Her persistent leg pain may be multifactorial  ; it is unclear if she has had follow up on known PAD since Dec 2017 with [facility].

## 2018-03-15 ENCOUNTER — Emergency Department
Admission: EM | Admit: 2018-03-15 | Discharge: 2018-03-15 | Disposition: A | Discharge status: Home / Self Care | Payer: Medicare Other | Attending: Emergency Medicine | Admitting: Emergency Medicine

## 2018-03-15 ENCOUNTER — Emergency Department: Payer: Medicare Other

## 2018-03-15 ENCOUNTER — Encounter: Payer: Self-pay | Admitting: Emergency Medicine

## 2018-03-15 ENCOUNTER — Other Ambulatory Visit: Payer: Self-pay

## 2018-03-15 DIAGNOSIS — Z955 Presence of coronary angioplasty implant and graft: Secondary | ICD-10-CM | POA: Diagnosis not present

## 2018-03-15 DIAGNOSIS — N183 Chronic kidney disease, stage 3 (moderate): Secondary | ICD-10-CM | POA: Diagnosis not present

## 2018-03-15 DIAGNOSIS — I5032 Chronic diastolic (congestive) heart failure: Secondary | ICD-10-CM | POA: Insufficient documentation

## 2018-03-15 DIAGNOSIS — Z794 Long term (current) use of insulin: Secondary | ICD-10-CM | POA: Diagnosis not present

## 2018-03-15 DIAGNOSIS — I130 Hypertensive heart and chronic kidney disease with heart failure and stage 1 through stage 4 chronic kidney disease, or unspecified chronic kidney disease: Secondary | ICD-10-CM | POA: Diagnosis not present

## 2018-03-15 DIAGNOSIS — J449 Chronic obstructive pulmonary disease, unspecified: Secondary | ICD-10-CM | POA: Insufficient documentation

## 2018-03-15 DIAGNOSIS — I2510 Atherosclerotic heart disease of native coronary artery without angina pectoris: Secondary | ICD-10-CM | POA: Insufficient documentation

## 2018-03-15 DIAGNOSIS — R079 Chest pain, unspecified: Secondary | ICD-10-CM | POA: Diagnosis not present

## 2018-03-15 DIAGNOSIS — M792 Neuralgia and neuritis, unspecified: Secondary | ICD-10-CM | POA: Diagnosis not present

## 2018-03-15 DIAGNOSIS — R0789 Other chest pain: Secondary | ICD-10-CM | POA: Diagnosis not present

## 2018-03-15 DIAGNOSIS — Z7982 Long term (current) use of aspirin: Secondary | ICD-10-CM | POA: Diagnosis not present

## 2018-03-15 DIAGNOSIS — Z79899 Other long term (current) drug therapy: Secondary | ICD-10-CM | POA: Insufficient documentation

## 2018-03-15 LAB — COMPREHENSIVE METABOLIC PANEL
ALBUMIN: 3.4 g/dL — AB (ref 3.5–5.0)
ALT: 16 U/L (ref 0–44)
AST: 21 U/L (ref 15–41)
Alkaline Phosphatase: 68 U/L (ref 38–126)
Anion gap: 8 (ref 5–15)
BUN: 34 mg/dL — AB (ref 8–23)
CHLORIDE: 106 mmol/L (ref 98–111)
CO2: 24 mmol/L (ref 22–32)
Calcium: 9.9 mg/dL (ref 8.9–10.3)
Creatinine, Ser: 1.9 mg/dL — ABNORMAL HIGH (ref 0.44–1.00)
GFR calc Af Amer: 28 mL/min — ABNORMAL LOW (ref >60)
GFR calc non Af Amer: 24 mL/min — ABNORMAL LOW (ref >60)
GLUCOSE: 220 mg/dL — AB (ref 70–99)
POTASSIUM: 3.9 mmol/L (ref 3.5–5.1)
Sodium: 138 mmol/L (ref 135–145)
Total Bilirubin: 0.7 mg/dL (ref 0.3–1.2)
Total Protein: 7.3 g/dL (ref 6.5–8.1)

## 2018-03-15 LAB — CBC
HCT: 32 % — ABNORMAL LOW (ref 36.0–46.0)
Hemoglobin: 10.1 g/dL — ABNORMAL LOW (ref 12.0–15.0)
MCH: 28.5 pg (ref 26.0–34.0)
MCHC: 31.6 g/dL (ref 30.0–36.0)
MCV: 90.4 fL (ref 80.0–100.0)
NRBC: 0 % (ref 0.0–0.2)
Platelets: 256 10*3/uL (ref 150–400)
RBC: 3.54 MIL/uL — AB (ref 3.87–5.11)
RDW: 15.2 % (ref 11.5–15.5)
WBC: 5.4 10*3/uL (ref 4.0–10.5)

## 2018-03-15 LAB — TROPONIN I
Troponin I: <0.03 ng/mL (ref <0.03)
Troponin I: <0.03 ng/mL (ref <0.03)

## 2018-03-15 MED ORDER — ACETAMINOPHEN 325 MG PO TABS
ORAL_TABLET | ORAL | Status: AC
  Administered 2018-03-15: 650 mg via ORAL
  Filled 2018-03-15: qty 2

## 2018-03-15 MED ORDER — ACETAMINOPHEN 325 MG PO TABS
650.0000 mg | ORAL_TABLET | Freq: Once | ORAL | Status: AC
  Administered 2018-03-15: 650 mg via ORAL

## 2018-03-15 NOTE — ED Notes (Signed)
Patient to waiting room via wheelchair by EMS.  EMS reports patient does exercises for neuropathy (did these at 11 pm tonight), went to bed then awoke with substernal chest pain that radiated into left arm.  Reports pain increases with movement.  EMS - 12 lead showing Afib; BP - 151/73; CBG 206; given aspirin 324 mg by mouth.

## 2018-03-15 NOTE — Telephone Encounter (Signed)
Pt aware of need for labs. Appt scheduled 11/15 Nothing further needed.

## 2018-03-15 NOTE — ED Notes (Addendum)
Pt given apple juice  

## 2018-03-15 NOTE — ED Notes (Signed)
Pt co of right upper 8/10 pain aching without radiation, pt reports having this pain before approx 1 week ago at Dr Derrel Nip, pt was having diarrhea  Pt with multiple concerns  Pt has tremors  Pt reports chronic HA, c/o right ear pain now and HA now

## 2018-03-15 NOTE — Discharge Instructions (Signed)
Your EKG, lab tests, and chest xray today were okay.  Please follow up with cardiology as soon as possible for further evaluation of your symptoms.

## 2018-03-15 NOTE — ED Provider Notes (Signed)
[facility] Emergency Department Provider Note  ____________________________________________  Time seen: Approximately 8:50 AM  I have reviewed the triage vital signs and the nursing notes.   HISTORY  Chief Complaint Chest Pain    HPI Pamela Collier is a 77 y.o. female with a history of ischemic cardiomyopathy, stroke, diabetes, anxiety who complains of  left upper chest pain occurring intermittently for the past week, described as a fluttering feeling.  Denies heaviness pressure sharpness or other discrete pain sensation.  Nonradiating.  No aggravating or alleviating factors.  No vomiting diaphoresis or shortness of breath.  Saw her primary care doctor about it and received an outpatient referral to cardiology.  She also reports that for the past 2 weeks she has been doing physical therapy exercises for her lower extremity neuropathy.  With this she gets increased pain afterward that extends from her leg up into her lower back.  No weakness or paresthesias.     Past Medical History:  Diagnosis Date  . Acute respiratory failure ([place]) 11/21/2014  . Anxiety   . Arthritis 08/05/2013  . Atherosclerotic peripheral vascular disease with gangrene [facility]) august 2012  . Cardiomyopathy, ischemic    a. EF 35 to 40% by echo in 2013 b. EF improved to 50-55% by echo in 04/2015; 11/2016 Echo: EF 55-60%, Gr1 DD, mildly dil LA, nl RV fxn.  . Cerebral infarct ([place]) 08/17/2013  . Chronic diastolic CHF (congestive heart failure) (HCC)    a. EF 50-55% by echo in 04/2015; b. 11/2016 Echo: EF 55-60%, Gr1 DD.  [name] COPD (chronic obstructive pulmonary disease) ([place])   . Coronary artery disease, occlusive    a. Previous PCI to the LAD, LCx, and RCA in 2010, 2011, 2013, and 2016. All performed in [place]; b. 11/2016 Lexiscan MV: EF 43%, no ischemia->low risk.  . 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 Diabetic diarrhea ([place]) 10/03/2014  . DM type 2, uncontrolled, with renal complications ([place])  07/15/1441  . Hepatic steatosis    by CT abd pelvis  . History of kidney stones    at a younger age  . Hyperlipidemia LDL goal <100 02/23/2014  . Hypertensive heart disease   . Osteoporosis, post-menopausal   . 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 Pill esophagitis   . Pleural effusion 10/25/2012   10/2012 CT chest >> small to moderate R lung effusion>> chylothorax, 100% lymphs 10/2013 thoracentesis> cytology negative, WBC 1471, > 90% "small lymphs" 01/2014 CT chest> near complete resolution of pleural effusion, stable lymphadenopathy 08/2014 CT chest [place] ([facility]): small right sided effusion decreased in size, stable mediastinal lymphadenopathy 1.0cm largest, 63m . 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 Sleep apnea   . Stage III chronic kidney disease ([facility]      Patient Active Problem List   Diagnosis Date Noted  . Functional diarrhea 03/13/2018  . Vitamin a deficiency, unspecified 02/22/2018  . Hypercalcemia 02/20/2018  . Environmental and seasonal allergies 01/15/2018  . Post-nasal drip 01/15/2018  . Restless legs 10/17/2017  . Leg pain 10/15/2017  . Normocytic anemia, not due to blood loss 09/27/2017  . Melena 09/25/2017  .  Cognitive decline 08/16/2017  . Polypharmacy 07/28/2017  . Pneumonia 05/26/2017  . Pill esophagitis 05/23/2017  . CKD (chronic kidney disease) stage 4, GFR 15-29 ml/min (HCC) 11/15/2016  . History of fall 11/12/2016  . Osteoarthritis 11/10/2016  . Right arm pain 10/15/2016  . Cervical stenosis of spinal canal 10/10/2016  . Cervical radiculopathy at C7 07/02/2016  . Epigastric hernia 04/01/2016  . Umbilical pain  77/82/4235  . CAD in native artery   . Type 2 DM with CKD stage 3 and hypertension ([place])   . Chronic renal insufficiency   . Hypertensive heart disease   . OSA on CPAP 07/07/2015  . Hospital discharge follow-up 07/07/2015  . Chest pain 06/29/2015  . Pleural effusion, right   . Chronic diastolic CHF (congestive heart failure) ([place]) 04/18/2015  . Diabetic neuropathy ([place]) 02/21/2015  . Benign neoplasm of sigmoid colon   . Low back pain with radiation 04/04/2014  . Essential hypertension 04/04/2014  . Hyperlipidemia 02/23/2014  . Long-term use of high-risk medication 02/23/2014  . Other long term (current) drug therapy 02/23/2014  . Dermatophytic onychia 08/17/2013  . Stable angina ([place]) 08/17/2013  . Cerebral infarct ([place]) 08/17/2013  . Bony exostosis 08/17/2013  . Obesity, unspecified 08/17/2013  . Arthritis 08/05/2013  . Unsteadiness on feet 03/24/2013  . History of colonic polyps 02/20/2013  . Sarcoidosis ([place]) 12/07/2012  . Dysphagia 12/07/2012  . Pulmonary nodule seen on imaging study 05/20/2012  . 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 Diabetic nephropathy associated with type 2 diabetes mellitus ([place]) 02/29/2012  . Cardiomyopathy, ischemic   . Hepatic steatosis   . Osteoporosis, post-menopausal   . Peripheral vascular disease due to secondary diabetes mellitus ([place])   . Double vessel coronary artery disease 12/14/2011     Past Surgical History:  Procedure Laterality Date  . ABDOMINAL HYSTERECTOMY     at ge 39. secondary to bleeding/partial  . ANTERIOR CERVICAL DECOMP/DISCECTOMY FUSION N/A 10/10/2016   Procedure: Anterior Cervical Discectomy Fusion - Cervical four4- five - Cervical five-Cervical six - Cervical six-Cervical seven;  Surgeon: Earnie Larsson, MD;  Location: [facility];  Service: Neurosurgery;  Laterality: N/A;  . BREAST CYST ASPIRATION Right   . CARPAL TUNNEL RELEASE Bilateral   . CHOLECYSTECTOMY     in [place]   . COLONOSCOPY WITH PROPOFOL N/A 01/09/2015    Procedure: COLONOSCOPY WITH PROPOFOL;  Surgeon: Lucilla Lame, MD;  Location: [facility] ENDOSCOPY;  Service: Endoscopy;  Laterality: N/A;  . CORONARY ANGIOPLASTY WITH STENT PLACEMENT     [place]; [facility]  . EYE SURGERY     bil cataracts  . HERNIA REPAIR     umbilical/ Dr Pat Patrick  . OOPHORECTOMY    . PTCA  August 2012   Right Posterior tibial artery , [place]  . TOE AMPUTATION  Sept 2012   Right 2nd toe, [place]     Prior to Admission medications   Medication Sig Start Date End Date Taking? Authorizing Provider  ACCU-CHEK SOFTCLIX LANCETS lancets USE UP TO THREE TIMES DAILY AS DIRECTED 02/18/18   [provider]  acetaminophen (TYLENOL) 500 MG tablet Take 1,000 mg by mouth every 6 (six) hours as needed for mild pain or headache.     [provider]  ALPRAZolam [name]) 0.25 MG tablet Take 1 tablet (0.25 mg total) by mouth daily. 03/03/18   [name], MD  aspirin 81 MG chewable tablet Chew 81 mg by mouth daily.     [provider]  B-D UF III MINI PEN NEEDLES 31G X 5 MM MISC USE THREE TIMES A DAY 02/18/18   [name], MD  Beta Carotene (VITAMIN A) 25000 UNIT capsule Take 1 capsule (25,000 Units total) by mouth daily. 02/22/18   [name], MD  blood glucose meter kit and supplies Dispense based on patient and insurance preference. Use up to three times daily as directed. (FOR ICD-10 E11.21) 02/18/18   [name], MD  BRILINTA 90 MG TABS tablet Take 1 tablet (90 mg total) by mouth 2 (two) times daily. 03/03/18   Minna Merritts, MD  dicyclomine (BENTYL) 10 MG capsule Take 1 capsule (10 mg total) by mouth 4 (four) times daily -  before meals and at bedtime. 03/10/18   [name], MD  docusate sodium (COLACE) 100 MG capsule Take 100 mg by mouth daily as needed for mild constipation. Reported on 11/09/2015    [provider]  donepezil (ARICEPT) 10 MG tablet Take 10 mg by mouth at bedtime.    [provider]   feeding supplement, GLUCERNA SHAKE, (GLUCERNA SHAKE) LIQD Take 237 mLs by mouth 2 (two) times daily between meals. 05/27/17   Gouru, Illene Silver, MD  fluticasone (FLONASE) 50 MCG/ACT nasal spray Place 2 sprays into both nostrils daily as needed for rhinitis. 01/20/18   [name], MD  furosemide (LASIX) 20 MG tablet Take 1 tablet (20 mg total) by mouth every other day. 03/03/18   Minna Merritts, MD  glucose blood (ACCU-CHEK AVIVA PLUS) test strip USE TO CHECK GLUCOSE THREE TIMES DAILY 02/16/18   [name], MD  glucose blood test strip Use to check blood sugars 2 times daily 02/16/18   [name], MD  Insulin Lispro Prot & Lispro (HUMALOG MIX 75/25 KWIKPEN) (75-25) 100 UNIT/ML Kwikpen INJECT 45 TO 60 UNITS TWICE DAILY BEFORE MORNING AND EVENING MEALS Patient taking differently: 45-60 Units. INJECT 45 TO 60 UNITS TWICE DAILY BEFORE MORNING AND EVENING MEALS 02/02/18   [name], MD  isosorbide mononitrate (IMDUR) 30 MG 24 hr tablet Take 1 tablet (30 mg total) by mouth daily. 03/03/18   Minna Merritts, MD  losartan (COZAAR) 100 MG tablet Take 1 tablet (100 mg total) by mouth daily. 03/03/18   Minna Merritts, MD  metoprolol succinate (TOPROL-XL) 25 MG 24 hr tablet Take 1 tablet (25 mg total) by mouth daily. 03/03/18   Minna Merritts, MD  mometasone-formoterol (DULERA) 200-5 MCG/ACT AERO Inhale 2 puffs into the lungs 2 (two) times daily. 02/01/18   [name], MD  nitroGLYCERIN (NITROLINGUAL) 0.4 MG/SPRAY spray Place 1 spray under the tongue every 5 (five) minutes x 3 doses as needed for chest pain. 01/14/17   [name], MD  pregabalin (LYRICA) 100 MG capsule Take 1 capsule (100 mg total) by mouth 3 (three) times daily. 03/03/18   [name], MD  rosuvastatin (CRESTOR) 20 MG tablet Take 1 tablet (20 mg total) by mouth daily. 03/03/18   Minna Merritts, MD     Allergies Cephalexin; Contrast media [iodinated diagnostic agents]; Doxycycline; Gabapentin;  and Sulfa antibiotics   Family History  Problem Relation Age of Onset  . Heart disease Father   . Heart attack Father   . Heart disease Sister   . Heart attack Sister   . Heart disease Brother   . Heart attack Brother   . Asthma Grandchild   . Breast cancer Maternal Aunt     Social History  Social History   Tobacco Use  . Smoking status: Never Smoker  . Smokeless tobacco: Never Used  Substance Use Topics  . Alcohol use: No    Alcohol/week: 0.0 standard drinks  . Drug use: No    Review of Systems  Constitutional:   No fever or chills.  ENT:   No sore throat. No rhinorrhea. Cardiovascular:   Positive chest discomfort as above without syncope. Respiratory:   No dyspnea or cough. Gastrointestinal:   Negative for abdominal pain, vomiting and diarrhea.  Musculoskeletal: Positive chronic bilateral lower extremity neuropathy. All other systems reviewed and are negative except as documented above in ROS and HPI.  ____________________________________________   PHYSICAL EXAM:  VITAL SIGNS: ED Triage Vitals  Enc Vitals Group     BP 03/15/18 0242 (!) 165/53     Pulse Rate 03/15/18 0242 66     Resp 03/15/18 0242 18     Temp 03/15/18 0242 98.2 F (36.8 C)     Temp Source 03/15/18 0242 Oral     SpO2 03/15/18 0242 100 %     Weight 03/15/18 0244 182 lb (82.6 kg)     Height 03/15/18 0244 _0  (1.702 m)     Head Circumference --      Peak Flow --      Pain Score 03/15/18 0243 8     Pain Loc --      Pain Edu? --      Excl. in [place]? --     Vital signs reviewed, nursing assessments reviewed.   Constitutional:   Alert and oriented. Non-toxic appearance. Eyes:   Conjunctivae are normal. EOMI. PERRL. ENT      Head:   Normocephalic and atraumatic.      Nose:   No congestion/rhinnorhea.       Mouth/Throat:   MMM, no pharyngeal erythema. No peritonsillar mass.       Neck:   No meningismus. Full ROM. Hematological/Lymphatic/Immunilogical:   No cervical  lymphadenopathy. Cardiovascular:   RRR. Symmetric bilateral radial and DP pulses.  No murmurs. Cap refill less than 2 seconds. Respiratory:   Normal respiratory effort without tachypnea/retractions. Breath sounds are clear and equal bilaterally. No wheezes/rales/rhonchi. Gastrointestinal:   Soft and nontender. Non distended. There is no CVA tenderness.  No rebound, rigidity, or guarding. Musculoskeletal:   Normal range of motion in all extremities. No joint effusions.  No lower extremity tenderness.  No edema. Left upper anterior chest is tender to the touch reproducing her symptoms.  No focal bony tenderness. Neurologic:   Normal speech and language.  Motor grossly intact. No acute focal neurologic deficits are appreciated.  Skin:    Skin is warm, dry and intact. No rash noted.  No petechiae, purpura, or bullae.  ____________________________________________    LABS (pertinent positives/negatives) (all labs ordered are listed, but only abnormal results are displayed) Labs Reviewed  CBC - Abnormal; Notable for the following components:      Result Value   RBC 3.54 (*)    Hemoglobin 10.1 (*)    HCT 32.0 (*)    All other components within normal limits  COMPREHENSIVE METABOLIC PANEL - Abnormal; Notable for the following components:   Glucose, Bld 220 (*)    BUN 34 (*)    Creatinine, Ser 1.90 (*)    Albumin 3.4 (*)    GFR calc non Af Amer 24 (*)    GFR calc Af Amer 28 (*)    All other components within normal limits  TROPONIN  I  TROPONIN I   ____________________________________________   EKG  Interpreted by me Sinus rhythm rate of 65, left axis, normal intervals.  Right bundle branch block.  No acute ischemic changes.  LVH in the high lateral leads with associated repolarization abnormality.  ____________________________________________    RADIOLOGY  Dg Chest 2 View  Result Date: 03/15/2018 CLINICAL DATA:  Right-sided chest pain radiating to the right. EXAM: CHEST - 2  VIEW COMPARISON:  02/10/2018 FINDINGS: Fibrosis or atelectasis in the lung bases. Peribronchial thickening suggesting chronic bronchitis. No airspace disease or consolidation. No blunting of costophrenic angles. No pneumothorax. Mediastinal contours appear intact. Degenerative changes in the spine and shoulders. Postoperative changes in the cervical spine. Calcification of the aorta. IMPRESSION: Fibrosis or atelectasis in the lung bases. Chronic bronchitic changes. Electronically Signed   By: Lucienne Capers M.D.   On: 03/15/2018 03:25    ____________________________________________   PROCEDURES Procedures  ____________________________________________    CLINICAL IMPRESSION / ASSESSMENT AND PLAN / ED COURSE  Pertinent labs & imaging results that were available during my care of the patient were reviewed by me and considered in my medical decision making (see chart for details).    Patient presents with palpitations, very atypical chest discomfort.Considering the patient's symptoms, medical history, and physical examination today, I have low suspicion for ACS, PE, TAD, pneumothorax, carditis, mediastinitis, pneumonia, CHF, or sepsis.  On exam she does have reproducible chest wall tenderness in the area of pain.  EKG is unremarkable, 2 troponins are negative.  Chest x-ray unremarkable.  Plan for close follow-up this week with cardiology for further evaluation of her symptoms which may be related to a paroxysmal dysrhythmia.      ____________________________________________   FINAL CLINICAL IMPRESSION(S) / ED DIAGNOSES    Final diagnoses:  Chest wall pain  Neuropathic pain     ED Discharge Orders    None      Portions of this note were generated with dragon dictation software. Dictation errors may occur despite best attempts at proofreading.    Carrie Mew, MD 03/15/18 [phone]

## 2018-03-15 NOTE — ED Notes (Signed)
Pt alert and oriented X4, active, cooperative, pt in NAD. RR even and unlabored, color WNL.  Pt informed to return if any life threatening symptoms occur.  Pt informed to return if any life threatening symptoms occur.  Left with all of belongings.  Ambulates safely.

## 2018-03-15 NOTE — ED Triage Notes (Addendum)
EMS pt from home with c/o chest pain; pt says she did her exercises for her neuropathy around 11pm and went to bed; woke with chest pain on the right side that radiates down her right side; pt says she was given 4-81mg  aspirin by EMS and now currently just has a "small aching pain" to her right chest that she rates 8/10; pt reports shortness of breath that has resolved; no N/V; productive cough earlier this evening;

## 2018-03-16 DIAGNOSIS — R197 Diarrhea, unspecified: Secondary | ICD-10-CM | POA: Diagnosis not present

## 2018-03-16 NOTE — Addendum Note (Signed)
Addended by: Leeanne Rio on: 03/16/2018 11:53 AM   Modules accepted: Orders

## 2018-03-19 ENCOUNTER — Observation Stay
Admission: EM | Admit: 2018-03-19 | Discharge: 2018-03-22 | Disposition: A | Discharge status: Skilled Nursing Facility | Payer: Medicare Other | Attending: Internal Medicine | Admitting: Internal Medicine

## 2018-03-19 ENCOUNTER — Ambulatory Visit: Payer: Medicare Other | Admitting: Nurse Practitioner

## 2018-03-19 ENCOUNTER — Emergency Department: Payer: Medicare Other

## 2018-03-19 ENCOUNTER — Encounter: Payer: Self-pay | Admitting: Emergency Medicine

## 2018-03-19 ENCOUNTER — Other Ambulatory Visit: Payer: Self-pay

## 2018-03-19 DIAGNOSIS — Z881 Allergy status to other antibiotic agents status: Secondary | ICD-10-CM | POA: Diagnosis not present

## 2018-03-19 DIAGNOSIS — Z79899 Other long term (current) drug therapy: Secondary | ICD-10-CM | POA: Insufficient documentation

## 2018-03-19 DIAGNOSIS — I5032 Chronic diastolic (congestive) heart failure: Secondary | ICD-10-CM | POA: Diagnosis not present

## 2018-03-19 DIAGNOSIS — E1122 Type 2 diabetes mellitus with diabetic chronic kidney disease: Secondary | ICD-10-CM | POA: Insufficient documentation

## 2018-03-19 DIAGNOSIS — N184 Chronic kidney disease, stage 4 (severe): Secondary | ICD-10-CM | POA: Insufficient documentation

## 2018-03-19 DIAGNOSIS — Z794 Long term (current) use of insulin: Secondary | ICD-10-CM | POA: Insufficient documentation

## 2018-03-19 DIAGNOSIS — Z7951 Long term (current) use of inhaled steroids: Secondary | ICD-10-CM | POA: Diagnosis not present

## 2018-03-19 DIAGNOSIS — Y92009 Unspecified place in unspecified non-institutional (private) residence as the place of occurrence of the external cause: Secondary | ICD-10-CM | POA: Insufficient documentation

## 2018-03-19 DIAGNOSIS — N3000 Acute cystitis without hematuria: Secondary | ICD-10-CM | POA: Insufficient documentation

## 2018-03-19 DIAGNOSIS — E1151 Type 2 diabetes mellitus with diabetic peripheral angiopathy without gangrene: Secondary | ICD-10-CM | POA: Insufficient documentation

## 2018-03-19 DIAGNOSIS — E785 Hyperlipidemia, unspecified: Secondary | ICD-10-CM | POA: Diagnosis not present

## 2018-03-19 DIAGNOSIS — Z888 Allergy status to other drugs, medicaments and biological substances status: Secondary | ICD-10-CM | POA: Insufficient documentation

## 2018-03-19 DIAGNOSIS — R51 Headache: Secondary | ICD-10-CM | POA: Insufficient documentation

## 2018-03-19 DIAGNOSIS — Z7902 Long term (current) use of antithrombotics/antiplatelets: Secondary | ICD-10-CM | POA: Diagnosis not present

## 2018-03-19 DIAGNOSIS — R0602 Shortness of breath: Secondary | ICD-10-CM | POA: Diagnosis not present

## 2018-03-19 DIAGNOSIS — J449 Chronic obstructive pulmonary disease, unspecified: Secondary | ICD-10-CM | POA: Diagnosis not present

## 2018-03-19 DIAGNOSIS — K529 Noninfective gastroenteritis and colitis, unspecified: Secondary | ICD-10-CM | POA: Diagnosis not present

## 2018-03-19 DIAGNOSIS — I130 Hypertensive heart and chronic kidney disease with heart failure and stage 1 through stage 4 chronic kidney disease, or unspecified chronic kidney disease: Secondary | ICD-10-CM | POA: Insufficient documentation

## 2018-03-19 DIAGNOSIS — Z7982 Long term (current) use of aspirin: Secondary | ICD-10-CM | POA: Diagnosis not present

## 2018-03-19 DIAGNOSIS — N179 Acute kidney failure, unspecified: Secondary | ICD-10-CM | POA: Diagnosis not present

## 2018-03-19 DIAGNOSIS — I2510 Atherosclerotic heart disease of native coronary artery without angina pectoris: Secondary | ICD-10-CM | POA: Insufficient documentation

## 2018-03-19 DIAGNOSIS — F0390 Unspecified dementia without behavioral disturbance: Secondary | ICD-10-CM | POA: Diagnosis not present

## 2018-03-19 DIAGNOSIS — R55 Syncope and collapse: Principal | ICD-10-CM | POA: Insufficient documentation

## 2018-03-19 DIAGNOSIS — Z882 Allergy status to sulfonamides status: Secondary | ICD-10-CM | POA: Insufficient documentation

## 2018-03-19 DIAGNOSIS — E1140 Type 2 diabetes mellitus with diabetic neuropathy, unspecified: Secondary | ICD-10-CM | POA: Diagnosis not present

## 2018-03-19 DIAGNOSIS — F418 Other specified anxiety disorders: Secondary | ICD-10-CM | POA: Insufficient documentation

## 2018-03-19 DIAGNOSIS — W109XXA Fall (on) (from) unspecified stairs and steps, initial encounter: Secondary | ICD-10-CM | POA: Diagnosis not present

## 2018-03-19 DIAGNOSIS — N183 Chronic kidney disease, stage 3 (moderate): Secondary | ICD-10-CM | POA: Diagnosis not present

## 2018-03-19 DIAGNOSIS — S199XXA Unspecified injury of neck, initial encounter: Secondary | ICD-10-CM | POA: Diagnosis not present

## 2018-03-19 DIAGNOSIS — K5730 Diverticulosis of large intestine without perforation or abscess without bleeding: Secondary | ICD-10-CM | POA: Diagnosis not present

## 2018-03-19 DIAGNOSIS — R42 Dizziness and giddiness: Secondary | ICD-10-CM | POA: Diagnosis not present

## 2018-03-19 LAB — GASTROINTESTINAL PANEL BY PCR, STOOL (REPLACES STOOL CULTURE)
ASTROVIRUS: NOT DETECTED
Adenovirus F40/41: NOT DETECTED
Campylobacter species: NOT DETECTED
Cryptosporidium: NOT DETECTED
Cyclospora cayetanensis: NOT DETECTED
ENTEROPATHOGENIC E COLI (EPEC): NOT DETECTED
ENTEROTOXIGENIC E COLI (ETEC): NOT DETECTED
Entamoeba histolytica: NOT DETECTED
Enteroaggregative E coli (EAEC): NOT DETECTED
GIARDIA LAMBLIA: NOT DETECTED
NOROVIRUS GI/GII: NOT DETECTED
Plesimonas shigelloides: NOT DETECTED
ROTAVIRUS A: NOT DETECTED
SHIGA LIKE TOXIN PRODUCING E COLI (STEC): NOT DETECTED
SHIGELLA/ENTEROINVASIVE E COLI (EIEC): NOT DETECTED
Salmonella species: NOT DETECTED
Sapovirus (I, II, IV, and V): NOT DETECTED
VIBRIO CHOLERAE: NOT DETECTED
Vibrio species: NOT DETECTED
Yersinia enterocolitica: NOT DETECTED

## 2018-03-19 LAB — URINALYSIS, COMPLETE (UACMP) WITH MICROSCOPIC
BILIRUBIN URINE: NEGATIVE
GLUCOSE, UA: NEGATIVE mg/dL
KETONES UR: NEGATIVE mg/dL
Nitrite: NEGATIVE
PH: 5 (ref 5.0–8.0)
Protein, ur: NEGATIVE mg/dL
Specific Gravity, Urine: 1.005 (ref 1.005–1.030)

## 2018-03-19 LAB — DIFFERENTIAL
Abs Immature Granulocytes: 0.03 10*3/uL (ref 0.00–0.07)
BASOS PCT: 1 %
Basophils Absolute: 0.1 10*3/uL (ref 0.0–0.1)
Eosinophils Absolute: 0.2 10*3/uL (ref 0.0–0.5)
Eosinophils Relative: 3 %
Immature Granulocytes: 1 %
Lymphocytes Relative: 16 %
Lymphs Abs: 1.1 10*3/uL (ref 0.7–4.0)
MONOS PCT: 13 %
Monocytes Absolute: 0.8 10*3/uL (ref 0.1–1.0)
NEUTROS ABS: 4.4 10*3/uL (ref 1.7–7.7)
NEUTROS PCT: 66 %

## 2018-03-19 LAB — BASIC METABOLIC PANEL
ANION GAP: 8 (ref 5–15)
BUN: 29 mg/dL — ABNORMAL HIGH (ref 8–23)
CALCIUM: 9.9 mg/dL (ref 8.9–10.3)
CO2: 24 mmol/L (ref 22–32)
Chloride: 109 mmol/L (ref 98–111)
Creatinine, Ser: 2.18 mg/dL — ABNORMAL HIGH (ref 0.44–1.00)
GFR calc Af Amer: 24 mL/min — ABNORMAL LOW (ref >60)
GFR calc non Af Amer: 21 mL/min — ABNORMAL LOW (ref >60)
GLUCOSE: 131 mg/dL — AB (ref 70–99)
POTASSIUM: 4.4 mmol/L (ref 3.5–5.1)
Sodium: 141 mmol/L (ref 135–145)

## 2018-03-19 LAB — CBC
HCT: 31.6 % — ABNORMAL LOW (ref 36.0–46.0)
Hemoglobin: 9.8 g/dL — ABNORMAL LOW (ref 12.0–15.0)
MCH: 28.5 pg (ref 26.0–34.0)
MCHC: 31 g/dL (ref 30.0–36.0)
MCV: 91.9 fL (ref 80.0–100.0)
NRBC: 0 % (ref 0.0–0.2)
PLATELETS: 257 10*3/uL (ref 150–400)
RBC: 3.44 MIL/uL — AB (ref 3.87–5.11)
RDW: 15.4 % (ref 11.5–15.5)
WBC: 6.9 10*3/uL (ref 4.0–10.5)

## 2018-03-19 LAB — HEPATIC FUNCTION PANEL
ALBUMIN: 3.3 g/dL — AB (ref 3.5–5.0)
ALK PHOS: 44 U/L (ref 38–126)
ALT: 14 U/L (ref 0–44)
AST: 22 U/L (ref 15–41)
BILIRUBIN TOTAL: 0.9 mg/dL (ref 0.3–1.2)
Bilirubin, Direct: <0.1 mg/dL (ref 0.0–0.2)
Total Protein: 7 g/dL (ref 6.5–8.1)

## 2018-03-19 LAB — TROPONIN I
Troponin I: 0.03 ng/mL (ref <0.03)
Troponin I: <0.03 ng/mL (ref <0.03)

## 2018-03-19 LAB — GLUCOSE, CAPILLARY
Glucose-Capillary: 79 mg/dL (ref 70–99)
Glucose-Capillary: 162 mg/dL — ABNORMAL HIGH (ref 70–99)

## 2018-03-19 LAB — C DIFFICILE QUICK SCREEN W PCR REFLEX
C DIFFICILE (CDIFF) INTERP: NOT DETECTED
C Diff antigen: NEGATIVE
C Diff toxin: NEGATIVE

## 2018-03-19 LAB — OCCULT BLOOD X 1 CARD TO LAB, STOOL: Fecal Occult Bld: NEGATIVE

## 2018-03-19 MED ORDER — INSULIN ASPART 100 UNIT/ML ~~LOC~~ SOLN: 0.0000 [IU] | Freq: Every day | SUBCUTANEOUS | Status: DC

## 2018-03-19 MED ORDER — DICYCLOMINE HCL 10 MG PO CAPS
10.0000 mg | ORAL_CAPSULE | Freq: Three times a day (TID) | ORAL | Status: DC
  Administered 2018-03-19 – 2018-03-22 (×11): 10 mg via ORAL
  Filled 2018-03-19 (×11): qty 1

## 2018-03-19 MED ORDER — ALPRAZOLAM 0.5 MG PO TABS
0.2500 mg | ORAL_TABLET | Freq: Every day | ORAL | Status: DC
  Administered 2018-03-20 – 2018-03-22 (×3): 0.25 mg via ORAL
  Filled 2018-03-19 (×3): qty 1

## 2018-03-19 MED ORDER — LEVOFLOXACIN 500 MG PO TABS
250.0000 mg | ORAL_TABLET | Freq: Every day | ORAL | Status: DC
  Administered 2018-03-20 – 2018-03-22 (×3): 250 mg via ORAL
  Filled 2018-03-19 (×3): qty 1

## 2018-03-19 MED ORDER — INSULIN ASPART 100 UNIT/ML ~~LOC~~ SOLN
0.0000 [IU] | Freq: Three times a day (TID) | SUBCUTANEOUS | Status: DC
  Administered 2018-03-20: 2 [IU] via SUBCUTANEOUS
  Administered 2018-03-20 – 2018-03-21 (×2): 1 [IU] via SUBCUTANEOUS
  Administered 2018-03-21: 2 [IU] via SUBCUTANEOUS
  Administered 2018-03-21: 1 [IU] via SUBCUTANEOUS
  Administered 2018-03-22: 2 [IU] via SUBCUTANEOUS
  Filled 2018-03-19 (×7): qty 1

## 2018-03-19 MED ORDER — ACETAMINOPHEN 500 MG PO TABS
1000.0000 mg | ORAL_TABLET | Freq: Four times a day (QID) | ORAL | Status: DC | PRN
  Administered 2018-03-19 – 2018-03-20 (×2): 1000 mg via ORAL
  Filled 2018-03-19 (×2): qty 2

## 2018-03-19 MED ORDER — SODIUM CHLORIDE 0.9 % IV SOLN
INTRAVENOUS | Status: DC
  Administered 2018-03-19: 50 mL/h via INTRAVENOUS
  Administered 2018-03-20 – 2018-03-21 (×2): via INTRAVENOUS

## 2018-03-19 MED ORDER — MECLIZINE HCL 25 MG PO TABS
12.5000 mg | ORAL_TABLET | Freq: Three times a day (TID) | ORAL | Status: DC | PRN
  Filled 2018-03-19: qty 0.5

## 2018-03-19 MED ORDER — MECLIZINE HCL 25 MG PO TABS
12.5000 mg | ORAL_TABLET | Freq: Once | ORAL | Status: AC
  Administered 2018-03-19: 12.5 mg via ORAL
  Filled 2018-03-19: qty 0.5

## 2018-03-19 MED ORDER — ROSUVASTATIN CALCIUM 20 MG PO TABS
20.0000 mg | ORAL_TABLET | Freq: Every day | ORAL | Status: DC
  Administered 2018-03-20 – 2018-03-21 (×2): 20 mg via ORAL
  Filled 2018-03-19: qty 2
  Filled 2018-03-19 (×2): qty 1
  Filled 2018-03-19: qty 2

## 2018-03-19 MED ORDER — LEVOFLOXACIN 500 MG PO TABS
500.0000 mg | ORAL_TABLET | ORAL | Status: AC
  Administered 2018-03-19: 500 mg via ORAL
  Filled 2018-03-19: qty 1

## 2018-03-19 MED ORDER — DONEPEZIL HCL 5 MG PO TABS
10.0000 mg | ORAL_TABLET | Freq: Every day | ORAL | Status: DC
  Administered 2018-03-19 – 2018-03-21 (×3): 10 mg via ORAL
  Filled 2018-03-19 (×4): qty 2

## 2018-03-19 MED ORDER — INSULIN ASPART PROT & ASPART (70-30 MIX) 100 UNIT/ML ~~LOC~~ SUSP
30.0000 [IU] | Freq: Two times a day (BID) | SUBCUTANEOUS | Status: DC
  Administered 2018-03-20 – 2018-03-22 (×5): 30 [IU] via SUBCUTANEOUS
  Filled 2018-03-19 (×6): qty 10

## 2018-03-19 MED ORDER — ASPIRIN EC 81 MG PO TBEC
81.0000 mg | DELAYED_RELEASE_TABLET | Freq: Every day | ORAL | Status: DC
  Administered 2018-03-20 – 2018-03-22 (×3): 81 mg via ORAL
  Filled 2018-03-19 (×3): qty 1

## 2018-03-19 MED ORDER — GLUCERNA SHAKE PO LIQD
237.0000 mL | Freq: Two times a day (BID) | ORAL | Status: DC
  Administered 2018-03-20 – 2018-03-22 (×5): 237 mL via ORAL

## 2018-03-19 MED ORDER — FLUTICASONE PROPIONATE 50 MCG/ACT NA SUSP
2.0000 | Freq: Every day | NASAL | Status: DC | PRN
  Filled 2018-03-19: qty 16

## 2018-03-19 MED ORDER — ISOSORBIDE MONONITRATE ER 60 MG PO TB24
30.0000 mg | ORAL_TABLET | Freq: Every day | ORAL | Status: DC
  Administered 2018-03-20 – 2018-03-22 (×3): 30 mg via ORAL
  Filled 2018-03-19 (×3): qty 1

## 2018-03-19 MED ORDER — METOPROLOL SUCCINATE ER 50 MG PO TB24
25.0000 mg | ORAL_TABLET | Freq: Every day | ORAL | Status: DC
  Filled 2018-03-19: qty 1

## 2018-03-19 MED ORDER — MOMETASONE FURO-FORMOTEROL FUM 200-5 MCG/ACT IN AERO
2.0000 | INHALATION_SPRAY | Freq: Two times a day (BID) | RESPIRATORY_TRACT | Status: DC
  Administered 2018-03-19 – 2018-03-22 (×5): 2 via RESPIRATORY_TRACT
  Filled 2018-03-19: qty 8.8

## 2018-03-19 MED ORDER — NITROGLYCERIN 0.4 MG/SPRAY TL SOLN
1.0000 | Status: DC | PRN
  Filled 2018-03-19: qty 4.9

## 2018-03-19 MED ORDER — PREGABALIN 50 MG PO CAPS
100.0000 mg | ORAL_CAPSULE | Freq: Three times a day (TID) | ORAL | Status: DC
  Administered 2018-03-19 – 2018-03-20 (×2): 100 mg via ORAL
  Filled 2018-03-19 (×2): qty 2

## 2018-03-19 MED ORDER — TICAGRELOR 90 MG PO TABS
90.0000 mg | ORAL_TABLET | Freq: Two times a day (BID) | ORAL | Status: DC
  Administered 2018-03-19 – 2018-03-22 (×6): 90 mg via ORAL
  Filled 2018-03-19 (×6): qty 1

## 2018-03-19 NOTE — H&P (Signed)
[facility] [name]    MR#:  [id number]  DATE OF BIRTH:  April 02, 1941  DATE OF ADMISSION:  03/19/2018  PRIMARY CARE PHYSICIAN: [name], MD   REQUESTING/REFERRING PHYSICIAN: Dr [name]  CHIEF COMPLAINT:   Chief Complaint  Patient presents with  . Near Syncope    HISTORY OF PRESENT ILLNESS:  Pamela Collier  is a 77 y.o. female states that she got dizzy this morning.  She has 3 stairs in her house and she fell backwards onto the floor.  She states that she normally walks without any cane or walker but uses them if she needs it.  Today she felt the room was spinning.  Currently not spinning.  She does have a little headache.  She did fall on her head and her right elbow.  Of note she states that she has been having bad diarrhea this is been going on for a long time.  She states that she gets it 4-5 times per month.  No blood in the bowel movements.  Patient did have a colonoscopy back in 2016 with polyp removals and negative random biopsies.  Patient was found to have a positive urine analysis.  Hospitalist services were contacted for further evaluation.  PAST MEDICAL HISTORY:   Past Medical History:  Diagnosis Date  . Acute respiratory failure ([place]) 11/21/2014  . Anxiety   . Arthritis 08/05/2013  . Atherosclerotic peripheral vascular disease with gangrene [facility]) august 2012  . Cardiomyopathy, ischemic    a. EF 35 to 40% by echo in 2013 b. EF improved to 50-55% by echo in 04/2015; 11/2016 Echo: EF 55-60%, Gr1 DD, mildly dil LA, nl RV fxn.  . Cerebral infarct ([place]) 08/17/2013  . Chronic diastolic CHF (congestive heart failure) (HCC)    a. EF 50-55% by echo in 04/2015; b. 11/2016 Echo: EF 55-60%, Gr1 DD.  [name] COPD (chronic obstructive pulmonary disease) ([place])   . Coronary artery disease, occlusive    a. Previous PCI to the LAD, LCx, and RCA in 2010, 2011, 2013, and 2016. All performed in [place]; b. 11/2016 Lexiscan  MV: EF 43%, no ischemia->low risk.  . 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 Diabetic diarrhea ([place]) 10/03/2014  . DM type 2, uncontrolled, with renal complications ([place]) 05/16/7260  . Hepatic steatosis    by CT abd pelvis  . History of kidney stones    at a younger age  . Hyperlipidemia LDL goal <100 02/23/2014  . Hypertensive heart disease   . Osteoporosis, post-menopausal   . 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 Pill esophagitis   . Pleural effusion 10/25/2012   10/2012 CT chest >> small to moderate R lung effusion>> chylothorax, 100% lymphs 10/2013 thoracentesis> cytology negative, WBC 1471, > 90% "small lymphs" 01/2014 CT chest> near complete resolution of pleural effusion, stable lymphadenopathy 08/2014 CT chest [place] ([facility]): small right sided effusion decreased in size, stable mediastinal lymphadenopathy 1.0cm largest, 42m . 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 Sleep apnea   . Stage III chronic kidney disease ([place]  PAST SURGICAL HISTORY:   Past Surgical History:  Procedure Laterality Date  . ABDOMINAL HYSTERECTOMY     at ge 39. secondary to bleeding/partial  . ANTERIOR CERVICAL DECOMP/DISCECTOMY FUSION N/A 10/10/2016   Procedure: Anterior Cervical Discectomy Fusion - Cervical four4- five - Cervical five-Cervical six - Cervical six-Cervical seven;  Surgeon: Earnie Larsson, MD;  Location: [place];  Service: Neurosurgery;  Laterality: N/A;  . BREAST CYST ASPIRATION Right   . CARPAL TUNNEL RELEASE Bilateral   . CHOLECYSTECTOMY     in [place]   . COLONOSCOPY WITH PROPOFOL N/A 01/09/2015   Procedure: COLONOSCOPY WITH PROPOFOL;  Surgeon: Lucilla Lame, MD;  Location: [facility] ENDOSCOPY;  Service: Endoscopy;  Laterality: N/A;  . CORONARY ANGIOPLASTY WITH STENT PLACEMENT     [place]; [facility]  . EYE SURGERY     bil cataracts  . HERNIA REPAIR     umbilical/ Dr Pat Patrick  . OOPHORECTOMY    . PTCA  August 2012   Right Posterior tibial artery , [place]  . TOE AMPUTATION  Sept 2012   Right 2nd toe, [place]    SOCIAL HISTORY:   Social History   Tobacco Use  . Smoking status: Never Smoker  . Smokeless tobacco: Never Used  Substance Use Topics  . Alcohol use: No    Alcohol/week: 0.0 standard drinks    FAMILY HISTORY:   Family History  Problem Relation Age of Onset  . Heart disease Father   . Heart attack Father   . Heart disease Sister   . Heart attack Sister   . Heart disease Brother   . Heart attack Brother   . Tremor Mother   . Asthma Grandchild   . Breast cancer Maternal Aunt     DRUG ALLERGIES:   Allergies  Allergen Reactions  . Cephalexin Itching  . Contrast Media [Iodinated Diagnostic Agents] Itching  . Doxycycline Itching  . Gabapentin Itching  . Sulfa Antibiotics Itching    REVIEW OF SYSTEMS:  CONSTITUTIONAL: No fever.  Positive for sweats at night.  No fatigue or weakness.  EYES: No blurred or double vision.  EARS, NOSE, AND THROAT: No tinnitus or ear pain. No sore throat.  Occasional right ear pain.  Positive for post nasal drip RESPIRATORY: Positive for cough.  Occasional shortness of breath.  No wheezing or hemoptysis.  CARDIOVASCULAR: Occasional chest pain.  No orthopnea, edema.  GASTROINTESTINAL: No nausea, vomiting.  positive for diarrhea and abdominal pain. No blood in bowel movements GENITOURINARY: No dysuria, hematuria.  ENDOCRINE: No polyuria, nocturia,  HEMATOLOGY: No anemia, easy bruising or bleeding SKIN: No rash or lesion.  Some itching MUSCULOSKELETAL: No joint pain or arthritis.   NEUROLOGIC: No tingling, numbness, weakness.  PSYCHIATRY: No anxiety or depression.    MEDICATIONS AT HOME:   Prior to Admission medications   Medication Sig Start Date End Date Taking? Authorizing Provider  acetaminophen (TYLENOL) 500 MG tablet Take 1,000 mg by mouth every 6 (six) hours as needed for mild pain or headache.    Yes [provider]  ALPRAZolam (XANAX) 0.25 MG tablet Take 1 tablet (0.25 mg total) by mouth daily. 03/03/18  Yes [name], MD  aspirin EC 81 MG tablet Take 81 mg by mouth daily.   Yes [provider]  B-D UF III MINI PEN NEEDLES 31G X 5 MM MISC USE THREE TIMES A DAY 02/18/18  Yes [name], MD  blood glucose meter kit and supplies Dispense based on  patient and insurance preference. Use up to three times daily as directed. (FOR ICD-10 E11.21) 02/18/18  Yes [name], MD  BRILINTA 90 MG TABS tablet Take 1 tablet (90 mg total) by mouth 2 (two) times daily. 03/03/18  Yes Gollan, Kathlene November, MD  dicyclomine (BENTYL) 10 MG capsule Take 1 capsule (10 mg total) by mouth 4 (four) times daily -  before meals and at bedtime. 03/10/18  Yes [name], MD  donepezil (ARICEPT) 10 MG tablet Take 10 mg by mouth at bedtime.   Yes [provider]  feeding supplement, GLUCERNA SHAKE, (GLUCERNA SHAKE) LIQD Take 237 mLs by mouth 2 (two) times daily between meals. 05/27/17  Yes Gouru, Illene Silver, MD  fluticasone (FLONASE) 50 MCG/ACT nasal spray Place 2 sprays into both nostrils daily as needed for rhinitis. 01/20/18  Yes [name], MD  furosemide (LASIX) 20 MG tablet Take 1 tablet (20 mg total) by mouth every other day. 03/03/18  Yes Gollan, Kathlene November, MD  glucose blood (ACCU-CHEK AVIVA PLUS) test strip USE TO CHECK GLUCOSE THREE TIMES DAILY 02/16/18  Yes [name], MD  glucose blood test strip Use to check blood sugars 2 times daily 02/16/18  Yes [name], MD  Insulin Lispro Prot & Lispro (HUMALOG MIX 75/25 KWIKPEN) (75-25) 100 UNIT/ML Kwikpen INJECT 45 TO 60 UNITS TWICE DAILY BEFORE MORNING AND EVENING  MEALS Patient taking differently: Inject 45-60 Units into the skin See admin instructions. 45 units every morning and 60 units at bedtime 02/02/18  Yes [name], MD  isosorbide mononitrate (IMDUR) 30 MG 24 hr tablet Take 1 tablet (30 mg total) by mouth daily. 03/03/18  Yes Minna Merritts, MD  losartan (COZAAR) 100 MG tablet Take 1 tablet (100 mg total) by mouth daily. 03/03/18  Yes Minna Merritts, MD  metoprolol succinate (TOPROL-XL) 25 MG 24 hr tablet Take 1 tablet (25 mg total) by mouth daily. 03/03/18  Yes Gollan, Kathlene November, MD  mometasone-formoterol (DULERA) 200-5 MCG/ACT AERO Inhale 2 puffs into the lungs 2 (two) times daily. 02/01/18  Yes [name], MD  nitroGLYCERIN (NITROLINGUAL) 0.4 MG/SPRAY spray Place 1 spray under the tongue every 5 (five) minutes x 3 doses as needed for chest pain. 01/14/17  Yes [name], MD  pregabalin (LYRICA) 100 MG capsule Take 1 capsule (100 mg total) by mouth 3 (three) times daily. 03/03/18  Yes [name], MD  rosuvastatin (CRESTOR) 20 MG tablet Take 1 tablet (20 mg total) by mouth daily. 03/03/18  Yes Gollan, Kathlene November, MD      VITAL SIGNS:  Blood pressure (!) 129/50, pulse (!) 58, temperature 97.8 F (36.6 C), temperature source Oral, resp. rate 12, height 5' 7"  (1.702 m), weight 82.6 kg, SpO2 99 %.  PHYSICAL EXAMINATION:  GENERAL:  77 y.o.-year-old patient lying in the bed with no acute distress.  EYES: Pupils equal, round, reactive to light and accommodation. No scleral icterus. Extraocular muscles intact.  HEENT: Head atraumatic, normocephalic. Oropharynx and nasopharynx clear.  NECK:  Supple, no jugular venous distention. No thyroid enlargement, no tenderness.  LUNGS: Normal breath sounds bilaterally, no wheezing, rales,rhonchi or crepitation. No use of accessory muscles of respiration.  CARDIOVASCULAR: S1, S2 normal. No murmurs, rubs, or gallops.  ABDOMEN: Soft, nontender, nondistended. Bowel sounds present. No  organomegaly or mass.  EXTREMITIES: No pedal edema, cyanosis, or clubbing.  NEUROLOGIC: Cranial nerves II through XII are intact. Muscle strength 5/5 in all extremities. Sensation intact. Gait not  checked.  PSYCHIATRIC: The patient is alert and oriented x 3.  SKIN: No rash, lesion, or ulcer.   LABORATORY PANEL:   CBC Recent Labs  Lab 03/19/18 1248  WBC 6.9  HGB 9.8*  HCT 31.6*  PLT 257   ------------------------------------------------------------------------------------------------------------------  Chemistries  Recent Labs  Lab 03/19/18 1248  NA 141  K 4.4  CL 109  CO2 24  GLUCOSE 131*  BUN 29*  CREATININE 2.18*  CALCIUM 9.9  AST 22  ALT 14  ALKPHOS 44  BILITOT 0.9   ------------------------------------------------------------------------------------------------------------------  Cardiac Enzymes Recent Labs  Lab 03/19/18 1248  TROPONINI 0.03*   ------------------------------------------------------------------------------------------------------------------  RADIOLOGY:  Ct Abdomen Pelvis Wo Contrast  Result Date: 03/19/2018 CLINICAL DATA:  Diarrhea past several days with abdominal pain. History of IV contrast allergy. EXAM: CT ABDOMEN AND PELVIS WITHOUT CONTRAST TECHNIQUE: Multidetector CT imaging of the abdomen and pelvis was performed following the standard protocol without IV contrast. COMPARISON:  06/03/2017, 05/23/2017 and 10/19/2015 FINDINGS: Lower chest: No airspace process or significant effusion. Few tiny pulmonary nodules within the lung bases with the largest measuring 5 mm over the right lower lobe as these are unchanged from 05/23/2017. Heart is normal size. Calcification of the mitral valve annulus. Calcified plaque over the left main and 3 vessel coronary arteries. Calcified plaque over the thoracic aorta. There are calcified hilar mediastinal lymph nodes. Several bilateral pericardial phrenic lymph nodes unchanged. Several prominent lymph nodes  adjacent the descending thoracic aorta unchanged. Hepatobiliary: Gallbladder not visualized. Liver and biliary tree are within normal. Pancreas: Normal. Spleen: Normal. Adrenals/Urinary Tract: Adrenal glands are within normal. Kidneys are normal size without hydronephrosis or nephrolithiasis. The ureters and bladder are unremarkable. Stomach/Bowel: Stomach and small bowel are normal. Appendix not visualized and likely has been resected. Minimal diverticulosis of the colon. Vascular/Lymphatic: Mild-to-moderate calcified plaque over the abdominal aorta. Moderate calcified plaque over the superior mesenteric artery. Stable small lymph nodes over the gastrohepatic ligament and upper periaortic region. Reproductive: Previous hysterectomy. Other: No free fluid or focal inflammatory change. Musculoskeletal: Degenerative change of the spine and hips. IMPRESSION: No acute findings in the abdomen/pelvis. Few small nodules over the lung bases unchanged from 05/23/2017 and likely due to granulomatous disease. There are stable pericardial phrenic and upper abdominal nodes as described as well as multiple calcified hilar/mediastinal lymph nodes likely due to this presumed granulomatous process. Aortic Atherosclerosis (ICD10-I70.0). Atherosclerotic coronary artery disease. Minimal colonic diverticulosis. Electronically Signed   By: Marin Olp M.D.   On: 03/19/2018 15:48   Dg Chest 2 View  Result Date: 03/19/2018 CLINICAL DATA:  Syncopal episode.  Shortness of breath. EXAM: CHEST - 2 VIEW COMPARISON:  Chest radiograph March 15, 2018 and priors. FINDINGS: Cardiac silhouette is mildly enlarged. Calcified aortic arch. Diffuse interstitial prominence similar to prior examination with bibasilar strandy densities. No pleural effusion or focal consolidation. RIGHT apical granuloma. Old LEFT rib fractures. ACDF. Moderate degenerative change of the spine. IMPRESSION: 1. Diffuse interstitial prominence likely in part reflecting  chronic interstitial lung disease. Bibasilar atelectasis/scarring. 2. Stable cardiomegaly. Aortic Atherosclerosis (ICD10-I70.0). Electronically Signed   By: Elon Alas M.D.   On: 03/19/2018 13:44   Ct Head Wo Contrast  Result Date: 03/19/2018 CLINICAL DATA:  Near syncopal event. EXAM: CT HEAD WITHOUT CONTRAST CT CERVICAL SPINE WITHOUT CONTRAST TECHNIQUE: Multidetector CT imaging of the head and cervical spine was performed following the standard protocol without intravenous contrast. Multiplanar CT image reconstructions of the cervical spine were also generated. COMPARISON:  Head CT, 10/23/2015 FINDINGS:  CT HEAD FINDINGS Brain: No evidence of acute infarction, hemorrhage, hydrocephalus, extra-axial collection or mass lesion/mass effect. Minor periventricular white matter hypoattenuation noted consistent with chronic microvascular ischemic change. Chronic basal gangliar calcifications. Vascular: No hyperdense vessel or unexpected calcification. Skull: Normal. Negative for fracture or focal lesion. Sinuses/Orbits: Globes and orbits are unremarkable. The visualized sinuses and mastoid air cells are clear. Other: None. CT CERVICAL SPINE FINDINGS Alignment: Mild kyphosis, apex at C5-C6.  No spondylolisthesis. Skull base and vertebrae: No acute fracture. No primary bone lesion or focal pathologic process. Soft tissues and spinal canal: No prevertebral fluid or swelling. No visible canal hematoma. Disc levels: Status post anterior cervical disc fusion C4 through C7. Bone graft material in the fusion plate protrudes anterior fuse vertebra, which appears chronic. Moderate loss of disc height at C7-T1. Remaining unfused discs are well preserved in height. No disc herniation visualized. Upper chest: No masses or adenopathy. Dense carotid vascular calcifications. No acute findings. Lung apices are clear. Other: None. IMPRESSION: HEAD CT 1. No acute intracranial abnormalities. 2. Minor chronic microvascular ischemic  change. CERVICAL CT 1. No fracture or acute finding. Electronically Signed   By: Lajean Manes M.D.   On: 03/19/2018 15:40   Ct Cervical Spine Wo Contrast  Result Date: 03/19/2018 CLINICAL DATA:  Near syncopal event. EXAM: CT HEAD WITHOUT CONTRAST CT CERVICAL SPINE WITHOUT CONTRAST TECHNIQUE: Multidetector CT imaging of the head and cervical spine was performed following the standard protocol without intravenous contrast. Multiplanar CT image reconstructions of the cervical spine were also generated. COMPARISON:  Head CT, 10/23/2015 FINDINGS: CT HEAD FINDINGS Brain: No evidence of acute infarction, hemorrhage, hydrocephalus, extra-axial collection or mass lesion/mass effect. Minor periventricular white matter hypoattenuation noted consistent with chronic microvascular ischemic change. Chronic basal gangliar calcifications. Vascular: No hyperdense vessel or unexpected calcification. Skull: Normal. Negative for fracture or focal lesion. Sinuses/Orbits: Globes and orbits are unremarkable. The visualized sinuses and mastoid air cells are clear. Other: None. CT CERVICAL SPINE FINDINGS Alignment: Mild kyphosis, apex at C5-C6.  No spondylolisthesis. Skull base and vertebrae: No acute fracture. No primary bone lesion or focal pathologic process. Soft tissues and spinal canal: No prevertebral fluid or swelling. No visible canal hematoma. Disc levels: Status post anterior cervical disc fusion C4 through C7. Bone graft material in the fusion plate protrudes anterior fuse vertebra, which appears chronic. Moderate loss of disc height at C7-T1. Remaining unfused discs are well preserved in height. No disc herniation visualized. Upper chest: No masses or adenopathy. Dense carotid vascular calcifications. No acute findings. Lung apices are clear. Other: None. IMPRESSION: HEAD CT 1. No acute intracranial abnormalities. 2. Minor chronic microvascular ischemic change. CERVICAL CT 1. No fracture or acute finding. Electronically  Signed   By: Lajean Manes M.D.   On: 03/19/2018 15:40    EKG:   Sinus bradycardia, first-degree AV block, LAD, RBBB, LVH  IMPRESSION AND PLAN:   1.  Vertigo and fall, near syncope give 1 dose of meclizine.  IV fluid hydration.  Check orthostatic vital signs.  Monitor on telemetry physical therapy evaluation 2.  Chronic diarrhea send off stool studies if any further diarrhea. 3.  Chronic kidney disease stage III.  See if I can improve the creatinine with IV fluid hydration. 4.  Acute cystitis.  Give p.o. Levaquin.  This would also cover ear infection if the patient does have an ear infection.  Since the patient is in the hallway I did not have the otoscope.  I will look tomorrow morning in  her ears. 5.  History of sarcoidosis with chronic granulomatous disease seen on CT scan. 6.  Type 2 diabetes mellitus continue her insulin but I will decrease the dose with her chronic kidney disease 7.  Hypertension continue usual medications 8.  History of CAD on Brilinta and metoprolol and Crestor. 9.  Hyperlipidemia unspecified on Crestor  10.  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 Management plans discussed with the patient, and she is in agreement.  CODE STATUS: Full code  TOTAL TIME TAKING CARE OF THIS PATIENT: 50 minutes.    Loletha Grayer M.D on 03/19/2018 at 5:31 PM  Between 7am to 6pm - Pager - [phone]  After 6pm call admission pager [phone]  [facility]  [phone]  CC: Primary care physician; [name], MD

## 2018-03-19 NOTE — ED Notes (Signed)
Orthostatics lyiing 140/60 HR76  Sitting 136/58 HR 75  Standing 130/58 HR 77

## 2018-03-19 NOTE — ED Notes (Signed)
Report to Tammy, RN

## 2018-03-19 NOTE — ED Triage Notes (Signed)
Pt arrives from home via [facility] c/o near syncopal episode. Pt states she was ambulating up her stairs when she became dizzy, nearly falling. Protocol initiated.

## 2018-03-19 NOTE — ED Provider Notes (Signed)
[facility] Emergency Department Provider Note   ____________________________________________   First MD Initiated Contact with Patient 03/19/18 1250     (approximate)  I have reviewed the triage vital signs and the nursing notes.   HISTORY  Chief Complaint Near Syncope    HPI Pamela Collier is a 77 y.o. female reports she had a good bit of diarrhea this morning.  She was then walking up the stairs and got very weak and lightheaded fell down almost passed out.  She thinks she hit her head.  She complains of some pain in the right elbow as well.  She reports she has diarrhea 2 or 3 days a month.  Usually has some bad pain with it.  She is having some abdominal pain and bloating now.  Patient had a negative stool PCR and C. difficile earlier this week.  Past Medical History:  Diagnosis Date  . Acute respiratory failure ([place]) 11/21/2014  . Anxiety   . Arthritis 08/05/2013  . Atherosclerotic peripheral vascular disease with gangrene [facility]) august 2012  . Cardiomyopathy, ischemic    a. EF 35 to 40% by echo in 2013 b. EF improved to 50-55% by echo in 04/2015; 11/2016 Echo: EF 55-60%, Gr1 DD, mildly dil LA, nl RV fxn.  . Cerebral infarct ([place]) 08/17/2013  . Chronic diastolic CHF (congestive heart failure) (HCC)    a. EF 50-55% by echo in 04/2015; b. 11/2016 Echo: EF 55-60%, Gr1 DD.  [name] COPD (chronic obstructive pulmonary disease) ([place])   . Coronary artery disease, occlusive    a. Previous PCI to the LAD, LCx, and RCA in 2010, 2011, 2013, and 2016. All performed in [place]; b. 11/2016 Lexiscan MV: EF 43%, no ischemia->low risk.  . 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 Diabetic diarrhea ([place]) 10/03/2014  . DM type 2, uncontrolled, with renal complications ([place]) 01/15/2840  . Hepatic steatosis    by CT abd pelvis  . History of kidney stones    at a younger age  . Hyperlipidemia LDL goal <100 02/23/2014  . Hypertensive heart disease   . Osteoporosis, post-menopausal   .  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 Pill esophagitis   . Pleural effusion 10/25/2012   10/2012 CT chest >> small to moderate R lung effusion>> chylothorax, 100% lymphs 10/2013 thoracentesis> cytology negative, WBC 1471, > 90% "small lymphs" 01/2014 CT chest> near complete resolution of pleural effusion, stable lymphadenopathy 08/2014 CT chest [place] ([facility]): small right sided effusion decreased in size, stable mediastinal lymphadenopathy 1.0cm largest, 23m . 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 Sleep apnea   . Stage III chronic kidney disease ([facility]     Patient Active Problem List   Diagnosis Date Noted  . Functional diarrhea 03/13/2018  . Vitamin a deficiency, unspecified 02/22/2018  . Hypercalcemia 02/20/2018  . Environmental and seasonal allergies 01/15/2018  . Post-nasal drip 01/15/2018  . Restless legs 10/17/2017  . Leg pain 10/15/2017  . Normocytic anemia, not due to blood loss 09/27/2017  . Melena 09/25/2017  . Cognitive decline 08/16/2017  . Polypharmacy 07/28/2017  . Pneumonia 05/26/2017  . Pill  esophagitis 05/23/2017  . CKD (chronic kidney disease) stage 4, GFR 15-29 ml/min (HCC) 11/15/2016  . History of fall 11/12/2016  . Osteoarthritis 11/10/2016  . Right arm pain 10/15/2016  . Cervical stenosis of spinal canal 10/10/2016  . Cervical radiculopathy at C7 07/02/2016  . Epigastric hernia 04/01/2016  . Umbilical pain 16/02/9603  . CAD in native artery   . Type 2 DM with CKD stage 3 and hypertension ([place])   . Chronic renal insufficiency   . Hypertensive heart disease   . OSA on CPAP 07/07/2015  . Hospital discharge follow-up  07/07/2015  . Chest pain 06/29/2015  . Pleural effusion, right   . Chronic diastolic CHF (congestive heart failure) ([place]) 04/18/2015  . Diabetic neuropathy ([place]) 02/21/2015  . Benign neoplasm of sigmoid colon   . Low back pain with radiation 04/04/2014  . Essential hypertension 04/04/2014  . Hyperlipidemia 02/23/2014  . Long-term use of high-risk medication 02/23/2014  . Other long term (current) drug therapy 02/23/2014  . Dermatophytic onychia 08/17/2013  . Stable angina ([place]) 08/17/2013  . Cerebral infarct ([place]) 08/17/2013  . Bony exostosis 08/17/2013  . Obesity, unspecified 08/17/2013  . Arthritis 08/05/2013  . Unsteadiness on feet 03/24/2013  . History of colonic polyps 02/20/2013  . Sarcoidosis ([place]) 12/07/2012  . Dysphagia 12/07/2012  . Pulmonary nodule seen on imaging study 05/20/2012  . 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 Diabetic nephropathy associated with type 2 diabetes mellitus ([place]) 02/29/2012  . Cardiomyopathy, ischemic   . Hepatic steatosis   . Osteoporosis, post-menopausal   . Peripheral vascular disease due to secondary diabetes mellitus ([place])   . Double vessel coronary artery disease 12/14/2011    Past Surgical History:  Procedure Laterality Date  . ABDOMINAL HYSTERECTOMY     at ge 39. secondary to bleeding/partial  . ANTERIOR CERVICAL DECOMP/DISCECTOMY FUSION N/A 10/10/2016   Procedure: Anterior Cervical Discectomy Fusion - Cervical four4- five - Cervical five-Cervical six - Cervical six-Cervical seven;  Surgeon: Earnie Larsson, MD;  [facility];  Service: Neurosurgery;  Laterality: N/A;  . BREAST CYST ASPIRATION Right   . CARPAL TUNNEL RELEASE Bilateral   . CHOLECYSTECTOMY     in [place]   . COLONOSCOPY WITH PROPOFOL N/A 01/09/2015   Procedure: COLONOSCOPY WITH PROPOFOL;  Surgeon: Lucilla Lame, MD;  Location: [facility] ENDOSCOPY;  Service: Endoscopy;  Laterality: N/A;  . CORONARY ANGIOPLASTY WITH STENT PLACEMENT     [place]; [facility]  . EYE SURGERY     bil cataracts  . HERNIA REPAIR     umbilical/ Dr Pat Patrick  . OOPHORECTOMY    . PTCA  August 2012   Right Posterior tibial artery , [place]  . TOE AMPUTATION  Sept 2012   Right 2nd toe, [place]    Prior to Admission medications   Medication Sig Start Date End Date Taking? Authorizing Provider  ACCU-CHEK SOFTCLIX LANCETS lancets USE UP TO THREE TIMES DAILY AS DIRECTED 02/18/18   [provider]  acetaminophen (TYLENOL) 500 MG tablet Take 1,000 mg by mouth every 6 (six) hours as needed for mild pain or headache.     [provider]  ALPRAZolam [name]) 0.25 MG tablet Take 1 tablet (0.25 mg total) by mouth daily. 03/03/18   [name], MD  aspirin 81 MG chewable tablet Chew 81 mg by mouth daily.     [provider]  B-D UF III MINI PEN NEEDLES 31G X 5 MM MISC USE THREE TIMES A DAY  02/18/18   [name], MD  Beta Carotene (VITAMIN A) 25000 UNIT capsule Take 1 capsule (25,000 Units total) by mouth daily. 02/22/18   [name], MD  blood glucose meter kit and supplies Dispense based on patient and insurance preference. Use up to three times daily as directed. (FOR ICD-10 E11.21) 02/18/18   [name], MD  BRILINTA 90 MG TABS tablet Take 1 tablet (90 mg total) by mouth 2 (two) times daily. 03/03/18   Minna Merritts, MD  dicyclomine (BENTYL) 10 MG capsule Take 1 capsule (10 mg total) by mouth 4 (four) times daily -  before meals and at bedtime. 03/10/18   [name], MD  docusate sodium (COLACE) 100 MG capsule Take 100 mg by mouth daily as needed for mild constipation. Reported on 11/09/2015    [provider]  donepezil (ARICEPT) 10 MG tablet Take 10 mg by mouth at bedtime.    [provider]  feeding supplement, GLUCERNA SHAKE, (GLUCERNA SHAKE) LIQD Take 237 mLs by mouth 2 (two) times daily between meals. 05/27/17   Gouru, Illene Silver, MD  fluticasone (FLONASE) 50 MCG/ACT nasal spray Place 2 sprays into both nostrils  daily as needed for rhinitis. 01/20/18   [name], MD  furosemide (LASIX) 20 MG tablet Take 1 tablet (20 mg total) by mouth every other day. 03/03/18   Minna Merritts, MD  glucose blood (ACCU-CHEK AVIVA PLUS) test strip USE TO CHECK GLUCOSE THREE TIMES DAILY 02/16/18   [name], MD  glucose blood test strip Use to check blood sugars 2 times daily 02/16/18   [name], MD  Insulin Lispro Prot & Lispro (HUMALOG MIX 75/25 KWIKPEN) (75-25) 100 UNIT/ML Kwikpen INJECT 45 TO 60 UNITS TWICE DAILY BEFORE MORNING AND EVENING MEALS Patient taking differently: 45-60 Units. INJECT 45 TO 60 UNITS TWICE DAILY BEFORE MORNING AND EVENING MEALS 02/02/18   [name], MD  isosorbide mononitrate (IMDUR) 30 MG 24 hr tablet Take 1 tablet (30 mg total) by mouth daily. 03/03/18   Minna Merritts, MD  losartan (COZAAR) 100 MG tablet Take 1 tablet (100 mg total) by mouth daily. 03/03/18   Minna Merritts, MD  metoprolol succinate (TOPROL-XL) 25 MG 24 hr tablet Take 1 tablet (25 mg total) by mouth daily. 03/03/18   Minna Merritts, MD  mometasone-formoterol (DULERA) 200-5 MCG/ACT AERO Inhale 2 puffs into the lungs 2 (two) times daily. 02/01/18   [name], MD  nitroGLYCERIN (NITROLINGUAL) 0.4 MG/SPRAY spray Place 1 spray under the tongue every 5 (five) minutes x 3 doses as needed for chest pain. 01/14/17   [name], MD  pregabalin (LYRICA) 100 MG capsule Take 1 capsule (100 mg total) by mouth 3 (three) times daily. 03/03/18   [name], MD  rosuvastatin (CRESTOR) 20 MG tablet Take 1 tablet (20 mg total) by mouth daily. 03/03/18   Minna Merritts, MD    Allergies Cephalexin; Contrast media [iodinated diagnostic agents]; Doxycycline; Gabapentin; and Sulfa antibiotics  Family History  Problem Relation Age of Onset  . Heart disease Father   . Heart attack Father   . Heart disease Sister   . Heart attack Sister   . Heart disease Brother   . Heart attack Brother     . Asthma Grandchild   . Breast cancer Maternal Aunt     Social History Social History   Tobacco Use  . Smoking status: Never Smoker  . Smokeless tobacco: Never  Used  Substance Use Topics  . Alcohol use: No    Alcohol/week: 0.0 standard drinks  . Drug use: No    Review of Systems  Constitutional: No fever/chills Eyes: No visual changes. ENT: No sore throat. Cardiovascular: Denies chest pain. Respiratory: Denies shortness of breath. Gastrointestinal:  abdominal pain.  No nausea, no vomiting.   diarrhea.  No constipation. Genitourinary: Negative for dysuria. Musculoskeletal: Negative for back pain. Skin: Negative for rash. Neurological: Negative for headaches, focal weakness  ____________________________________________   PHYSICAL EXAM:  VITAL SIGNS: ED Triage Vitals [03/19/18 1301]  Enc Vitals Group     BP (!) 129/50     Pulse Rate (!) 58     Resp 12     Temp 97.8 F (36.6 C)     Temp Source Oral     SpO2 99 %     Weight 182 lb 1.6 oz (82.6 kg)     Height 5' 7"  (1.702 m)     Head Circumference      Peak Flow      Pain Score      Pain Loc      Pain Edu?      Excl. in [place]?     Constitutional: Alert and oriented.  Complaining of pain in the right side of her head the right elbow Eyes: Conjunctivae are normal. PERRL.  Head: Atraumatic. Nose: No congestion/rhinnorhea. Mouth/Throat: Mucous membranes are moist.  Oropharynx non-erythematous. Neck: No stridor.   Cardiovascular: Normal rate, regular rhythm. Grossly normal heart sounds.  Good peripheral circulation. Respiratory: Normal respiratory effort.  No retractions. Lungs CTAB. Gastrointestinal: Soft and nontender. No distention. No abdominal bruits. No CVA tenderness. Musculoskeletal: No lower extremity tenderness nor edema.  No swelling in the shoulder or elbow.  Good range of motion of the elbow normal distal pulse and capillary refill and sensation Neurologic:  Normal speech and language. No gross focal  neurologic deficits are appreciated.  Skin:  Skin is warm, dry and intact. No rash noted. Psychiatric: Mood and affect are normal. Speech and behavior are normal.  ____________________________________________   LABS (all labs ordered are listed, but only abnormal results are displayed)  Labs Reviewed  BASIC METABOLIC PANEL - Abnormal; Notable for the following components:      Result Value   Glucose, Bld 131 (*)    BUN 29 (*)    Creatinine, Ser 2.18 (*)    GFR calc non Af Amer 21 (*)    GFR calc Af Amer 24 (*)    All other components within normal limits  CBC - Abnormal; Notable for the following components:   RBC 3.44 (*)    Hemoglobin 9.8 (*)    HCT 31.6 (*)    All other components within normal limits  URINALYSIS, COMPLETE (UACMP) WITH MICROSCOPIC - Abnormal; Notable for the following components:   Color, Urine YELLOW (*)    APPearance HAZY (*)    Hgb urine dipstick MODERATE (*)    Leukocytes, UA LARGE (*)    Bacteria, UA MANY (*)    All other components within normal limits  TROPONIN I - Abnormal; Notable for the following components:   Troponin I 0.03 (*)    All other components within normal limits  HEPATIC FUNCTION PANEL - Abnormal; Notable for the following components:   Albumin 3.3 (*)    All other components within normal limits  DIFFERENTIAL  TROPONIN I  CBG MONITORING, ED   ____________________________________________  EKG  EKG read and interpreted by  me shows sinus bradycardia rate of 58 left axis flipped T waves in 1 and L which were present on the fourth.  No acute changes. ____________________________________________  RADIOLOGY  ED MD interpretation:    Official radiology report(s): Ct Abdomen Pelvis Wo Contrast  Result Date: 03/19/2018 CLINICAL DATA:  Diarrhea past several days with abdominal pain. History of IV contrast allergy. EXAM: CT ABDOMEN AND PELVIS WITHOUT CONTRAST TECHNIQUE: Multidetector CT imaging of the abdomen and pelvis was  performed following the standard protocol without IV contrast. COMPARISON:  06/03/2017, 05/23/2017 and 10/19/2015 FINDINGS: Lower chest: No airspace process or significant effusion. Few tiny pulmonary nodules within the lung bases with the largest measuring 5 mm over the right lower lobe as these are unchanged from 05/23/2017. Heart is normal size. Calcification of the mitral valve annulus. Calcified plaque over the left main and 3 vessel coronary arteries. Calcified plaque over the thoracic aorta. There are calcified hilar mediastinal lymph nodes. Several bilateral pericardial phrenic lymph nodes unchanged. Several prominent lymph nodes adjacent the descending thoracic aorta unchanged. Hepatobiliary: Gallbladder not visualized. Liver and biliary tree are within normal. Pancreas: Normal. Spleen: Normal. Adrenals/Urinary Tract: Adrenal glands are within normal. Kidneys are normal size without hydronephrosis or nephrolithiasis. The ureters and bladder are unremarkable. Stomach/Bowel: Stomach and small bowel are normal. Appendix not visualized and likely has been resected. Minimal diverticulosis of the colon. Vascular/Lymphatic: Mild-to-moderate calcified plaque over the abdominal aorta. Moderate calcified plaque over the superior mesenteric artery. Stable small lymph nodes over the gastrohepatic ligament and upper periaortic region. Reproductive: Previous hysterectomy. Other: No free fluid or focal inflammatory change. Musculoskeletal: Degenerative change of the spine and hips. IMPRESSION: No acute findings in the abdomen/pelvis. Few small nodules over the lung bases unchanged from 05/23/2017 and likely due to granulomatous disease. There are stable pericardial phrenic and upper abdominal nodes as described as well as multiple calcified hilar/mediastinal lymph nodes likely due to this presumed granulomatous process. Aortic Atherosclerosis (ICD10-I70.0). Atherosclerotic coronary artery disease. Minimal colonic  diverticulosis. Electronically Signed   By: Marin Olp M.D.   On: 03/19/2018 15:48   Dg Chest 2 View  Result Date: 03/19/2018 CLINICAL DATA:  Syncopal episode.  Shortness of breath. EXAM: CHEST - 2 VIEW COMPARISON:  Chest radiograph March 15, 2018 and priors. FINDINGS: Cardiac silhouette is mildly enlarged. Calcified aortic arch. Diffuse interstitial prominence similar to prior examination with bibasilar strandy densities. No pleural effusion or focal consolidation. RIGHT apical granuloma. Old LEFT rib fractures. ACDF. Moderate degenerative change of the spine. IMPRESSION: 1. Diffuse interstitial prominence likely in part reflecting chronic interstitial lung disease. Bibasilar atelectasis/scarring. 2. Stable cardiomegaly. Aortic Atherosclerosis (ICD10-I70.0). Electronically Signed   By: Elon Alas M.D.   On: 03/19/2018 13:44   Ct Head Wo Contrast  Result Date: 03/19/2018 CLINICAL DATA:  Near syncopal event. EXAM: CT HEAD WITHOUT CONTRAST CT CERVICAL SPINE WITHOUT CONTRAST TECHNIQUE: Multidetector CT imaging of the head and cervical spine was performed following the standard protocol without intravenous contrast. Multiplanar CT image reconstructions of the cervical spine were also generated. COMPARISON:  Head CT, 10/23/2015 FINDINGS: CT HEAD FINDINGS Brain: No evidence of acute infarction, hemorrhage, hydrocephalus, extra-axial collection or mass lesion/mass effect. Minor periventricular white matter hypoattenuation noted consistent with chronic microvascular ischemic change. Chronic basal gangliar calcifications. Vascular: No hyperdense vessel or unexpected calcification. Skull: Normal. Negative for fracture or focal lesion. Sinuses/Orbits: Globes and orbits are unremarkable. The visualized sinuses and mastoid air cells are clear. Other: None. CT CERVICAL SPINE FINDINGS Alignment: Mild kyphosis, apex  at C5-C6.  No spondylolisthesis. Skull base and vertebrae: No acute fracture. No primary bone  lesion or focal pathologic process. Soft tissues and spinal canal: No prevertebral fluid or swelling. No visible canal hematoma. Disc levels: Status post anterior cervical disc fusion C4 through C7. Bone graft material in the fusion plate protrudes anterior fuse vertebra, which appears chronic. Moderate loss of disc height at C7-T1. Remaining unfused discs are well preserved in height. No disc herniation visualized. Upper chest: No masses or adenopathy. Dense carotid vascular calcifications. No acute findings. Lung apices are clear. Other: None. IMPRESSION: HEAD CT 1. No acute intracranial abnormalities. 2. Minor chronic microvascular ischemic change. CERVICAL CT 1. No fracture or acute finding. Electronically Signed   By: Lajean Manes M.D.   On: 03/19/2018 15:40   Ct Cervical Spine Wo Contrast  Result Date: 03/19/2018 CLINICAL DATA:  Near syncopal event. EXAM: CT HEAD WITHOUT CONTRAST CT CERVICAL SPINE WITHOUT CONTRAST TECHNIQUE: Multidetector CT imaging of the head and cervical spine was performed following the standard protocol without intravenous contrast. Multiplanar CT image reconstructions of the cervical spine were also generated. COMPARISON:  Head CT, 10/23/2015 FINDINGS: CT HEAD FINDINGS Brain: No evidence of acute infarction, hemorrhage, hydrocephalus, extra-axial collection or mass lesion/mass effect. Minor periventricular white matter hypoattenuation noted consistent with chronic microvascular ischemic change. Chronic basal gangliar calcifications. Vascular: No hyperdense vessel or unexpected calcification. Skull: Normal. Negative for fracture or focal lesion. Sinuses/Orbits: Globes and orbits are unremarkable. The visualized sinuses and mastoid air cells are clear. Other: None. CT CERVICAL SPINE FINDINGS Alignment: Mild kyphosis, apex at C5-C6.  No spondylolisthesis. Skull base and vertebrae: No acute fracture. No primary bone lesion or focal pathologic process. Soft tissues and spinal canal: No  prevertebral fluid or swelling. No visible canal hematoma. Disc levels: Status post anterior cervical disc fusion C4 through C7. Bone graft material in the fusion plate protrudes anterior fuse vertebra, which appears chronic. Moderate loss of disc height at C7-T1. Remaining unfused discs are well preserved in height. No disc herniation visualized. Upper chest: No masses or adenopathy. Dense carotid vascular calcifications. No acute findings. Lung apices are clear. Other: None. IMPRESSION: HEAD CT 1. No acute intracranial abnormalities. 2. Minor chronic microvascular ischemic change. CERVICAL CT 1. No fracture or acute finding. Electronically Signed   By: Lajean Manes M.D.   On: 03/19/2018 15:40    ____________________________________________   PROCEDURES  Procedure(s) performed:   Procedures  Critical Care performed:  ____________________________________________   INITIAL IMPRESSION / ASSESSMENT AND PLAN / ED COURSE  Patient had stool studies on the fifth which were negative.  EKG is unchanged from previously.  CTs are unrevealing.  She does have near syncope which she is never had before.  EKG is not normal although there is no acute changes.  We will plan on admitting her overnight to monitor her.     Clinical Course as of Mar 20 1623  Fri Mar 19, 2018  [facility] CT ABDOMEN PELVIS WO CONTRAST [PM]    Clinical Course User Index [PM] Nena Polio, MD     ____________________________________________   FINAL CLINICAL IMPRESSION(S) / ED DIAGNOSES  Final diagnoses:  Near syncope     ED Discharge Orders    None       Note:  This document was prepared using Dragon voice recognition software and may include unintentional dictation errors.    Nena Polio, MD 03/19/18 [phone]

## 2018-03-20 DIAGNOSIS — R55 Syncope and collapse: Secondary | ICD-10-CM | POA: Diagnosis not present

## 2018-03-20 DIAGNOSIS — N179 Acute kidney failure, unspecified: Secondary | ICD-10-CM | POA: Diagnosis not present

## 2018-03-20 DIAGNOSIS — K529 Noninfective gastroenteritis and colitis, unspecified: Secondary | ICD-10-CM | POA: Diagnosis not present

## 2018-03-20 DIAGNOSIS — N183 Chronic kidney disease, stage 3 (moderate): Secondary | ICD-10-CM | POA: Diagnosis not present

## 2018-03-20 DIAGNOSIS — N3000 Acute cystitis without hematuria: Secondary | ICD-10-CM | POA: Diagnosis not present

## 2018-03-20 DIAGNOSIS — R42 Dizziness and giddiness: Secondary | ICD-10-CM | POA: Diagnosis not present

## 2018-03-20 LAB — STOOL CULTURE
MICRO NUMBER: 91330455
MICRO NUMBER: 91330456
MICRO NUMBER: 91330457
SHIGA RESULT: NOT DETECTED
SPECIMEN QUALITY: ADEQUATE
SPECIMEN QUALITY:: ADEQUATE
SPECIMEN QUALITY:: ADEQUATE

## 2018-03-20 LAB — GLUCOSE, CAPILLARY
GLUCOSE-CAPILLARY: 165 mg/dL — AB (ref 70–99)
GLUCOSE-CAPILLARY: 144 mg/dL — AB (ref 70–99)
Glucose-Capillary: 101 mg/dL — ABNORMAL HIGH (ref 70–99)
Glucose-Capillary: 164 mg/dL — ABNORMAL HIGH (ref 70–99)

## 2018-03-20 LAB — GASTROINTESTINAL PATHOGEN PANEL PCR
C. DIFFICILE TOX A/B, PCR: NOT DETECTED
CAMPYLOBACTER, PCR: NOT DETECTED
CRYPTOSPORIDIUM, PCR: NOT DETECTED
E coli (ETEC) LT/ST PCR: NOT DETECTED
E coli (STEC) stx1/stx2, PCR: NOT DETECTED
E coli 0157, PCR: NOT DETECTED
GIARDIA LAMBLIA, PCR: NOT DETECTED
Norovirus, PCR: NOT DETECTED
Rotavirus A, PCR: NOT DETECTED
Salmonella, PCR: NOT DETECTED
Shigella, PCR: NOT DETECTED

## 2018-03-20 LAB — CLOSTRIDIUM DIFFICILE TOXIN B, QUALITATIVE, REAL-TIME PCR: Toxigenic C. Difficile by PCR: NOT DETECTED

## 2018-03-20 MED ORDER — CARBAMIDE PEROXIDE 6.5 % OT SOLN
5.0000 [drp] | Freq: Two times a day (BID) | OTIC | Status: DC
  Administered 2018-03-20 – 2018-03-22 (×5): 5 [drp] via OTIC
  Filled 2018-03-20 (×2): qty 15

## 2018-03-20 MED ORDER — DIPHENOXYLATE-ATROPINE 2.5-0.025 MG PO TABS
1.0000 | ORAL_TABLET | Freq: Four times a day (QID) | ORAL | Status: DC | PRN
  Filled 2018-03-20: qty 1

## 2018-03-20 MED ORDER — OXYCODONE HCL 5 MG PO TABS
5.0000 mg | ORAL_TABLET | Freq: Three times a day (TID) | ORAL | Status: DC | PRN
  Administered 2018-03-20 – 2018-03-22 (×3): 5 mg via ORAL
  Filled 2018-03-20 (×3): qty 1

## 2018-03-20 MED ORDER — PREGABALIN 50 MG PO CAPS
50.0000 mg | ORAL_CAPSULE | Freq: Two times a day (BID) | ORAL | Status: DC
  Administered 2018-03-20 – 2018-03-21 (×2): 50 mg via ORAL
  Filled 2018-03-20 (×2): qty 1

## 2018-03-20 MED ORDER — ENOXAPARIN SODIUM 30 MG/0.3ML ~~LOC~~ SOLN
30.0000 mg | SUBCUTANEOUS | Status: DC
  Administered 2018-03-20: 30 mg via SUBCUTANEOUS
  Filled 2018-03-20: qty 0.3

## 2018-03-20 MED ORDER — METOPROLOL SUCCINATE ER 25 MG PO TB24: 12.5000 mg | ORAL_TABLET | Freq: Every day | ORAL | Status: DC

## 2018-03-20 MED ORDER — BUTALBITAL-APAP-CAFFEINE 50-325-40 MG PO TABS
1.0000 | ORAL_TABLET | Freq: Four times a day (QID) | ORAL | Status: DC | PRN
  Filled 2018-03-20 (×2): qty 1

## 2018-03-20 NOTE — Evaluation (Signed)
Physical Therapy Evaluation Patient Details [name] MRN: [id number] DOB: Feb 09, 1941 Today's Date: 03/20/2018   History of Present Illness  77 y/o female here after bout of dizziness and fell down the 3 steps to her den, she reports pain in low back, and generally in R LE that she relates to the fall.  She reports history of other bouts of dizziness, they usually can last 30 minutes or more.   Clinical Impression  Pt somewhat anxious t/o the session, but showed good effort and ultimately was able to ambulate ~100 ft with constant cuing and gait training to insure consistent cadence, posture and ability to maintain appropriate gait.  She had tremor t/o the session and she states that she had regular issues with this worse than baseline.  She is anxious about ability to go home right now, and asks about additional help at home.  She did not have any dizziness t/o session (orthostatics sup: 114/64, sit: 146/46, stand: 143/54) and subjectively does not describe what would be BPPV.    Follow Up Recommendations Home health PT    Equipment Recommendations  None recommended by PT    Recommendations for Other Services       Precautions / Restrictions Precautions Precautions: Fall Restrictions Weight Bearing Restrictions: No      Mobility  Bed Mobility               General bed mobility comments: Pt in recliner on arrival, not tested  Transfers Overall transfer level: Modified independent Equipment used: Rolling walker (2 wheeled)             General transfer comment: Minimal cuing for set up and hand/foot placement, but no direct assist needed.   Ambulation/Gait Ambulation/Gait assistance: Supervision Gait Distance (Feet): 100 Feet Assistive device: Rolling walker (2 wheeled)       General Gait Details: Pt with slow, but relatively consistent gait.  She was able to maintain consistent walker motion, but had some hesitancy and reports not feeling close to  her baseline (cane only)  Science writer    Modified Rankin (Stroke Patients Only)       Balance Overall balance assessment: Modified Independent                                           Pertinent Vitals/Pain Pain Assessment: 0-10 Pain Score: 9  Pain Location: low back, R LE    Home Living Family/patient expects to be discharged to:: Private residence Living Arrangements: Spouse/significant other Available Help at Discharge: Family;Available 24 hours/day Type of Home: House Home Access: Level entry     Home Layout: One level Home Equipment: Walker - 2 wheels;Walker - 4 wheels;Cane - quad      Prior Function Level of Independence: Independent with assistive device(s)         Comments: Mod Ind with amb limited community distances mostly with a QC but occasionally with a RW, no fall history, Ind with ADLs     Hand Dominance        Extremity/Trunk Assessment   Upper Extremity Assessment Upper Extremity Assessment: Generalized weakness    Lower Extremity Assessment Lower Extremity Assessment: Generalized weakness       Communication   Communication: No difficulties  Cognition Arousal/Alertness: Awake/alert Behavior During Therapy: Anxious Overall Cognitive Status: Difficult  to assess                                 General Comments: Pt anxious about how she is doing and generally about going home      General Comments      Exercises     Assessment/Plan    PT Assessment Patient needs continued PT services  PT Problem List Decreased strength;Decreased range of motion;Decreased activity tolerance;Decreased balance;Decreased mobility;Decreased coordination;Decreased knowledge of use of DME;Decreased safety awareness;Decreased knowledge of precautions;Decreased cognition;Pain       PT Treatment Interventions DME instruction;Gait training;Stair training;Functional mobility  training;Therapeutic activities;Therapeutic exercise;Balance training;Neuromuscular re-education;Patient/family education    PT Goals (Current goals can be found in the Care Plan section)  Acute Rehab PT Goals Patient Stated Goal: go home, but not too early PT Goal Formulation: With patient Time For Goal Achievement: 04/03/18 Potential to Achieve Goals: Good    Frequency Min 2X/week   Barriers to discharge        Co-evaluation               AM-PAC PT "6 Clicks" Daily Activity  Outcome Measure Difficulty turning over in bed (including adjusting bedclothes, sheets and blankets)?: A Little Difficulty moving from lying on back to sitting on the side of the bed? : A Little Difficulty sitting down on and standing up from a chair with arms (e.g., wheelchair, bedside commode, etc,.)?: A Little Help needed moving to and from a bed to chair (including a wheelchair)?: None Help needed walking in hospital room?: A Little Help needed climbing 3-5 steps with a railing? : A Lot 6 Click Score: 18    End of Session Equipment Utilized During Treatment: Gait belt Activity Tolerance: Patient tolerated treatment well;Patient limited by fatigue Patient left: with call bell/phone within reach;with chair alarm set Nurse Communication: Mobility status PT Visit Diagnosis: Muscle weakness (generalized) (M62.81);Difficulty in walking, not elsewhere classified (R26.2)    Time: 6967-8938 PT Time Calculation (min) (ACUTE ONLY): 27 min   Charges:   PT Evaluation $PT Eval Low Complexity: 1 Low PT Treatments $Gait Training: 8-22 mins        Kreg Shropshire, DPT 03/20/2018, 12:52 PM

## 2018-03-20 NOTE — Progress Notes (Signed)
Patient ID: Pamela Collier, female   DOB: [birth year], 77 y.o.   MRN: [id number]  [facility] PROGRESS NOTE  ALISIA VANENGEN QJF:[id number] DOB: 1940/10/03 DOA: 03/19/2018 PCP: [name], MD  HPI/Subjective: Patient feeling a little bit of a headache.  She stated she hit her head and right elbow when she fell.  She felt a little wobbly on her feet when walking with physical therapy.  Objective: Vitals:   03/20/18 1100 03/20/18 1413  BP: (!) 148/58 98/70  Pulse: 62 62  Resp: 14 (!) 21  Temp: 98.4 F (36.9 C) 98.2 F (36.8 C)  SpO2: 98% 96%    Filed Weights   03/19/18 1301 03/19/18 1815  Weight: 82.6 kg 85.2 kg    ROS: Review of Systems  Constitutional: Negative for chills and fever.  Eyes: Negative for blurred vision.  Respiratory: Negative for cough and shortness of breath.   Cardiovascular: Negative for chest pain.  Gastrointestinal: Positive for diarrhea. Negative for abdominal pain, constipation, nausea and vomiting.  Genitourinary: Negative for dysuria.  Musculoskeletal: Negative for joint pain.  Neurological: Negative for dizziness and headaches.   Exam: Physical Exam  Constitutional: She is oriented to person, place, and time.  HENT:  Nose: No mucosal edema.  Mouth/Throat: No oropharyngeal exudate or posterior oropharyngeal edema.  Eyes: Pupils are equal, round, and reactive to light. Conjunctivae, EOM and lids are normal.  Neck: No JVD present. Carotid bruit is not present. No edema present. No thyroid mass and no thyromegaly present.  Cardiovascular: S1 normal and S2 normal. Exam reveals no gallop.  No murmur heard. Pulses:      Dorsalis pedis pulses are 2+ on the right side, and 2+ on the left side.  Respiratory: No respiratory distress. She has no wheezes. She has no rhonchi. She has no rales.  GI: Soft. Bowel sounds are normal. There is no tenderness.  Musculoskeletal:       Right ankle: She exhibits swelling.       Left ankle: She exhibits  swelling.  Lymphadenopathy:    She has no cervical adenopathy.  Neurological: She is alert and oriented to person, place, and time. No cranial nerve deficit.  Skin: Skin is warm. No rash noted. Nails show no clubbing.  Psychiatric: She has a normal mood and affect.      Data Reviewed: Basic Metabolic Panel: Recent Labs  Lab 03/15/18 0252 03/19/18 1248  NA 138 141  K 3.9 4.4  CL 106 109  CO2 24 24  GLUCOSE 220* 131*  BUN 34* 29*  CREATININE 1.90* 2.18*  CALCIUM 9.9 9.9   Liver Function Tests: Recent Labs  Lab 03/15/18 0252 03/19/18 1248  AST 21 22  ALT 16 14  ALKPHOS 68 44  BILITOT 0.7 0.9  PROT 7.3 7.0  ALBUMIN 3.4* 3.3*   CBC: Recent Labs  Lab 03/15/18 0252 03/19/18 1248  WBC 5.4 6.9  NEUTROABS  --  4.4  HGB 10.1* 9.8*  HCT 32.0* 31.6*  MCV 90.4 91.9  PLT 256 257   Cardiac Enzymes: Recent Labs  Lab 03/15/18 0252 03/15/18 0721 03/19/18 1248 03/19/18 1941  TROPONINI <0.03 <0.03 0.03* <0.03    CBG: Recent Labs  Lab 03/19/18 1812 03/19/18 2201 03/20/18 0849 03/20/18 1149  GLUCAP 79 162* 101* 165*    Recent Results (from the past 240 hour(s))  Stool culture     Status: None (Preliminary result)   Collection Time: 03/16/18 11:53 AM  Result Value Ref Range Status  MICRO NUMBER: [id number]  Final   SPECIMEN QUALITY: ADEQUATE  Final   SOURCE: STOOL  Final   STATUS: FINAL  Final   SHIGA RESULT: Not Detected  Final   MICRO NUMBER: [id number]  Preliminary   SPECIMEN QUALITY: ADEQUATE  Preliminary   Source STOOL  Preliminary   STATUS: PRELIMINARY  Preliminary   CAM RESULT: Culture in progress  Preliminary   MICRO NUMBER: [id number]  Final   SPECIMEN QUALITY: ADEQUATE  Final   SOURCE: STOOL  Final   STATUS: FINAL  Final   SS RESULT: No Salmonella or Shigella isolated  Final  C difficile quick scan w PCR reflex     Status: None   Collection Time: 03/19/18  9:49 PM  Result Value Ref Range Status   C Diff antigen NEGATIVE NEGATIVE Final   C Diff  toxin NEGATIVE NEGATIVE Final   C Diff interpretation No C. difficile detected.  Final    Comment: Performed at [facility], [address] [place], [place] [address]  Gastrointestinal Panel by PCR , Stool     Status: None   Collection Time: 03/19/18  9:49 PM  Result Value Ref Range Status   Campylobacter species NOT DETECTED NOT DETECTED Final   Plesimonas shigelloides NOT DETECTED NOT DETECTED Final   Salmonella species NOT DETECTED NOT DETECTED Final   Yersinia enterocolitica NOT DETECTED NOT DETECTED Final   Vibrio species NOT DETECTED NOT DETECTED Final   Vibrio cholerae NOT DETECTED NOT DETECTED Final   Enteroaggregative E coli (EAEC) NOT DETECTED NOT DETECTED Final   Enteropathogenic E coli (EPEC) NOT DETECTED NOT DETECTED Final   Enterotoxigenic E coli (ETEC) NOT DETECTED NOT DETECTED Final   Shiga like toxin producing E coli (STEC) NOT DETECTED NOT DETECTED Final   Shigella/Enteroinvasive E coli (EIEC) NOT DETECTED NOT DETECTED Final   Cryptosporidium NOT DETECTED NOT DETECTED Final   Cyclospora cayetanensis NOT DETECTED NOT DETECTED Final   Entamoeba histolytica NOT DETECTED NOT DETECTED Final   Giardia lamblia NOT DETECTED NOT DETECTED Final   Adenovirus F40/41 NOT DETECTED NOT DETECTED Final   Astrovirus NOT DETECTED NOT DETECTED Final   Norovirus GI/GII NOT DETECTED NOT DETECTED Final   Rotavirus A NOT DETECTED NOT DETECTED Final   Sapovirus (I, II, IV, and V) NOT DETECTED NOT DETECTED Final    Comment: Performed at [facility], [address] [place], [place] [address]     Studies: Ct Abdomen Pelvis Wo Contrast  Result Date: 03/19/2018 CLINICAL DATA:  Diarrhea past several days with abdominal pain. History of IV contrast allergy. EXAM: CT ABDOMEN AND PELVIS WITHOUT CONTRAST TECHNIQUE: Multidetector CT imaging of the abdomen and pelvis was performed following the standard protocol without IV contrast. COMPARISON:  06/03/2017, 05/23/2017 and  10/19/2015 FINDINGS: Lower chest: No airspace process or significant effusion. Few tiny pulmonary nodules within the lung bases with the largest measuring 5 mm over the right lower lobe as these are unchanged from 05/23/2017. Heart is normal size. Calcification of the mitral valve annulus. Calcified plaque over the left main and 3 vessel coronary arteries. Calcified plaque over the thoracic aorta. There are calcified hilar mediastinal lymph nodes. Several bilateral pericardial phrenic lymph nodes unchanged. Several prominent lymph nodes adjacent the descending thoracic aorta unchanged. Hepatobiliary: Gallbladder not visualized. Liver and biliary tree are within normal. Pancreas: Normal. Spleen: Normal. Adrenals/Urinary Tract: Adrenal glands are within normal. Kidneys are normal size without hydronephrosis or nephrolithiasis. The ureters and bladder are unremarkable. Stomach/Bowel: Stomach and small  bowel are normal. Appendix not visualized and likely has been resected. Minimal diverticulosis of the colon. Vascular/Lymphatic: Mild-to-moderate calcified plaque over the abdominal aorta. Moderate calcified plaque over the superior mesenteric artery. Stable small lymph nodes over the gastrohepatic ligament and upper periaortic region. Reproductive: Previous hysterectomy. Other: No free fluid or focal inflammatory change. Musculoskeletal: Degenerative change of the spine and hips. IMPRESSION: No acute findings in the abdomen/pelvis. Few small nodules over the lung bases unchanged from 05/23/2017 and likely due to granulomatous disease. There are stable pericardial phrenic and upper abdominal nodes as described as well as multiple calcified hilar/mediastinal lymph nodes likely due to this presumed granulomatous process. Aortic Atherosclerosis (ICD10-I70.0). Atherosclerotic coronary artery disease. Minimal colonic diverticulosis. Electronically Signed   By: Marin Olp M.D.   On: 03/19/2018 15:48   Dg Chest 2  View  Result Date: 03/19/2018 CLINICAL DATA:  Syncopal episode.  Shortness of breath. EXAM: CHEST - 2 VIEW COMPARISON:  Chest radiograph March 15, 2018 and priors. FINDINGS: Cardiac silhouette is mildly enlarged. Calcified aortic arch. Diffuse interstitial prominence similar to prior examination with bibasilar strandy densities. No pleural effusion or focal consolidation. RIGHT apical granuloma. Old LEFT rib fractures. ACDF. Moderate degenerative change of the spine. IMPRESSION: 1. Diffuse interstitial prominence likely in part reflecting chronic interstitial lung disease. Bibasilar atelectasis/scarring. 2. Stable cardiomegaly. Aortic Atherosclerosis (ICD10-I70.0). Electronically Signed   By: Elon Alas M.D.   On: 03/19/2018 13:44   Ct Head Wo Contrast  Result Date: 03/19/2018 CLINICAL DATA:  Near syncopal event. EXAM: CT HEAD WITHOUT CONTRAST CT CERVICAL SPINE WITHOUT CONTRAST TECHNIQUE: Multidetector CT imaging of the head and cervical spine was performed following the standard protocol without intravenous contrast. Multiplanar CT image reconstructions of the cervical spine were also generated. COMPARISON:  Head CT, 10/23/2015 FINDINGS: CT HEAD FINDINGS Brain: No evidence of acute infarction, hemorrhage, hydrocephalus, extra-axial collection or mass lesion/mass effect. Minor periventricular white matter hypoattenuation noted consistent with chronic microvascular ischemic change. Chronic basal gangliar calcifications. Vascular: No hyperdense vessel or unexpected calcification. Skull: Normal. Negative for fracture or focal lesion. Sinuses/Orbits: Globes and orbits are unremarkable. The visualized sinuses and mastoid air cells are clear. Other: None. CT CERVICAL SPINE FINDINGS Alignment: Mild kyphosis, apex at C5-C6.  No spondylolisthesis. Skull base and vertebrae: No acute fracture. No primary bone lesion or focal pathologic process. Soft tissues and spinal canal: No prevertebral fluid or swelling. No  visible canal hematoma. Disc levels: Status post anterior cervical disc fusion C4 through C7. Bone graft material in the fusion plate protrudes anterior fuse vertebra, which appears chronic. Moderate loss of disc height at C7-T1. Remaining unfused discs are well preserved in height. No disc herniation visualized. Upper chest: No masses or adenopathy. Dense carotid vascular calcifications. No acute findings. Lung apices are clear. Other: None. IMPRESSION: HEAD CT 1. No acute intracranial abnormalities. 2. Minor chronic microvascular ischemic change. CERVICAL CT 1. No fracture or acute finding. Electronically Signed   By: Lajean Manes M.D.   On: 03/19/2018 15:40   Ct Cervical Spine Wo Contrast  Result Date: 03/19/2018 CLINICAL DATA:  Near syncopal event. EXAM: CT HEAD WITHOUT CONTRAST CT CERVICAL SPINE WITHOUT CONTRAST TECHNIQUE: Multidetector CT imaging of the head and cervical spine was performed following the standard protocol without intravenous contrast. Multiplanar CT image reconstructions of the cervical spine were also generated. COMPARISON:  Head CT, 10/23/2015 FINDINGS: CT HEAD FINDINGS Brain: No evidence of acute infarction, hemorrhage, hydrocephalus, extra-axial collection or mass lesion/mass effect. Minor periventricular white matter hypoattenuation noted  consistent with chronic microvascular ischemic change. Chronic basal gangliar calcifications. Vascular: No hyperdense vessel or unexpected calcification. Skull: Normal. Negative for fracture or focal lesion. Sinuses/Orbits: Globes and orbits are unremarkable. The visualized sinuses and mastoid air cells are clear. Other: None. CT CERVICAL SPINE FINDINGS Alignment: Mild kyphosis, apex at C5-C6.  No spondylolisthesis. Skull base and vertebrae: No acute fracture. No primary bone lesion or focal pathologic process. Soft tissues and spinal canal: No prevertebral fluid or swelling. No visible canal hematoma. Disc levels: Status post anterior cervical disc  fusion C4 through C7. Bone graft material in the fusion plate protrudes anterior fuse vertebra, which appears chronic. Moderate loss of disc height at C7-T1. Remaining unfused discs are well preserved in height. No disc herniation visualized. Upper chest: No masses or adenopathy. Dense carotid vascular calcifications. No acute findings. Lung apices are clear. Other: None. IMPRESSION: HEAD CT 1. No acute intracranial abnormalities. 2. Minor chronic microvascular ischemic change. CERVICAL CT 1. No fracture or acute finding. Electronically Signed   By: Lajean Manes M.D.   On: 03/19/2018 15:40    Scheduled Meds: . ALPRAZolam  0.25 mg Oral Daily  . aspirin EC  81 mg Oral Daily  . carbamide peroxide  5 drop Right EAR BID  . dicyclomine  10 mg Oral TID AC & HS  . donepezil  10 mg Oral QHS  . feeding supplement (GLUCERNA SHAKE)  237 mL Oral BID BM  . insulin aspart  0-5 Units Subcutaneous QHS  . insulin aspart  0-9 Units Subcutaneous TID WC  . insulin aspart protamine- aspart  30 Units Subcutaneous BID WC  . isosorbide mononitrate  30 mg Oral Daily  . levofloxacin  250 mg Oral Daily  . [START ON 03/21/2018] metoprolol succinate  12.5 mg Oral Daily  . mometasone-formoterol  2 puff Inhalation BID  . pregabalin  100 mg Oral TID  . rosuvastatin  20 mg Oral q1800  . ticagrelor  90 mg Oral BID   Continuous Infusions: . sodium chloride 50 mL/hr at 03/20/18 1227    Assessment/Plan:  1. Dizziness and fall.  No response to meclizine.  Continue IV fluid hydration.  Patient is not orthostatic.  Physical therapy did recommend home with home health. 2. Chronic diarrhea.  Stool studies are negative.  PRN Lomotil. 3. Chronic kidney disease stage III.  Creatinine has not improved with IV fluid hydration.  Continue IV fluids today and check creatinine again tomorrow. 4. Acute cystitis and left tympanic membrane slightly erythematous.  Continue oral Levaquin. 5. 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 Type 2 diabetes mellitus.  Decrease the dose of the 70/30 insulin. 7. Hypertension.  Decrease dose of Toprol-XL to 12.5 mg daily 8. History of CAD on Brilinta aspirin metoprolol Crestor 9. Hyperlipidemia unspecified on Crestor 10. 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 With fall and hit her head could have some concussion symptoms.  Code Status:  Code Status History    Date Active Date Inactive Code Status Order ID Comments User Context   06/04/2017 0151 06/04/2017 1944 Full Code [id number]  Lance Coon, MD Inpatient   05/24/2017 0129 05/26/2017 1926 Full Code [id number]  Lance Coon, MD Inpatient   11/14/2016 2249 11/15/2016 [place] Full Code [id number]  Harvie Bridge, DO ED   10/10/2016 1325 10/13/2016 2020 Full Code [id number]  Earnie Larsson, MD Inpatient   06/11/2016 2142 06/12/2016 1923 Full Code [id number]  [place], Ubaldo Glassing, DO ED   01/02/2016 0524 01/02/2016 0914 Full Code [id number]  Saundra Shelling, MD Inpatient   06/26/2015 0736 06/29/2015 1954 Full Code [id number]  Demetrios Loll, MD ED   04/20/2015 0118 04/22/2015 1918 Full Code [id number]  Lance Coon, MD Inpatient     Family Communication: Spoke with husband on the phone Disposition Plan: Evaluate daily for potential disposition  Antibiotics:  Oral Levaquin  Time spent: 28 minutes  [place]

## 2018-03-20 NOTE — Progress Notes (Signed)
Talked to Dr. Leslye Peer about patient's complaints of knee pain, had tylenol at 1116 that did not help much, order given for Oycodone 5mg  q8 prn. Also talked about patient had a 2.12 second pause, order to discontinue metoprolol and fioricet if patient is not having headache. RN will continue to monitor.

## 2018-03-20 NOTE — Care Management Obs Status (Signed)
[facility] NOTIFICATION   Patient Details  [name] MRN: [id number] Date of Birth: 02/03/1941   Medicare Observation Status Notification Given:  Yes    Rayyan Burley A Layali Freund, RN 03/20/2018, 3:56 PM

## 2018-03-21 DIAGNOSIS — N3000 Acute cystitis without hematuria: Secondary | ICD-10-CM | POA: Diagnosis not present

## 2018-03-21 DIAGNOSIS — N183 Chronic kidney disease, stage 3 (moderate): Secondary | ICD-10-CM | POA: Diagnosis not present

## 2018-03-21 DIAGNOSIS — R42 Dizziness and giddiness: Secondary | ICD-10-CM | POA: Diagnosis not present

## 2018-03-21 DIAGNOSIS — K529 Noninfective gastroenteritis and colitis, unspecified: Secondary | ICD-10-CM | POA: Diagnosis not present

## 2018-03-21 DIAGNOSIS — R55 Syncope and collapse: Secondary | ICD-10-CM | POA: Diagnosis not present

## 2018-03-21 DIAGNOSIS — N179 Acute kidney failure, unspecified: Secondary | ICD-10-CM | POA: Diagnosis not present

## 2018-03-21 LAB — GLUCOSE, CAPILLARY
GLUCOSE-CAPILLARY: 133 mg/dL — AB (ref 70–99)
GLUCOSE-CAPILLARY: 121 mg/dL — AB (ref 70–99)
Glucose-Capillary: 136 mg/dL — ABNORMAL HIGH (ref 70–99)
Glucose-Capillary: 94 mg/dL (ref 70–99)

## 2018-03-21 LAB — BASIC METABOLIC PANEL
Anion gap: 6 (ref 5–15)
BUN: 28 mg/dL — ABNORMAL HIGH (ref 8–23)
CHLORIDE: 107 mmol/L (ref 98–111)
CO2: 25 mmol/L (ref 22–32)
Calcium: 9.8 mg/dL (ref 8.9–10.3)
Creatinine, Ser: 1.65 mg/dL — ABNORMAL HIGH (ref 0.44–1.00)
GFR calc non Af Amer: 29 mL/min — ABNORMAL LOW (ref >60)
GFR, EST AFRICAN AMERICAN: 33 mL/min — AB (ref >60)
Glucose, Bld: 156 mg/dL — ABNORMAL HIGH (ref 70–99)
Potassium: 4.2 mmol/L (ref 3.5–5.1)
SODIUM: 138 mmol/L (ref 135–145)

## 2018-03-21 MED ORDER — ENOXAPARIN SODIUM 40 MG/0.4ML ~~LOC~~ SOLN
40.0000 mg | SUBCUTANEOUS | Status: DC
  Administered 2018-03-21: 40 mg via SUBCUTANEOUS
  Filled 2018-03-21: qty 0.4

## 2018-03-21 MED ORDER — PREGABALIN 50 MG PO CAPS
100.0000 mg | ORAL_CAPSULE | Freq: Two times a day (BID) | ORAL | Status: DC
  Administered 2018-03-21 – 2018-03-22 (×2): 100 mg via ORAL
  Filled 2018-03-21 (×2): qty 2

## 2018-03-21 MED ORDER — AMLODIPINE BESYLATE 5 MG PO TABS
5.0000 mg | ORAL_TABLET | Freq: Every day | ORAL | Status: DC
  Administered 2018-03-22: 5 mg via ORAL
  Filled 2018-03-21: qty 1

## 2018-03-21 NOTE — Progress Notes (Signed)
lovenox increased to 40mg  daily due to improvement in crcl to >72ml/min  Ramond Dial, Pharm.D, BCPS Clinical Pharmacist

## 2018-03-21 NOTE — Progress Notes (Signed)
Patient ID: Pamela Collier, female   DOB: 04-29-1941, 77 y.o.   MRN: [id number]  [facility] PROGRESS NOTE  Pamela Collier UMP:[id number] DOB: [birth year] DOA: 03/19/2018 PCP: [name], MD  HPI/Subjective: Patient complaining of right leg pain from the below the knee down to her foot.  Patient has a history of neuropathy.  She states she has not had any further diarrhea.  Still feeling weak and trouble with walking around.  Objective: Vitals:   03/21/18 0406 03/21/18 0752  BP: (!) 157/65 (!) 175/58  Pulse: 65 63  Resp: 19 20  Temp: 98.6 F (37 C) 98.6 F (37 C)  SpO2: 100% 98%    Filed Weights   03/19/18 1301 03/19/18 1815  Weight: 82.6 kg 85.2 kg    ROS: Review of Systems  Constitutional: Positive for malaise/fatigue. Negative for chills and fever.  Eyes: Negative for blurred vision.  Respiratory: Negative for cough and shortness of breath.   Cardiovascular: Negative for chest pain.  Gastrointestinal: Negative for abdominal pain, constipation, diarrhea, nausea and vomiting.  Genitourinary: Negative for dysuria.  Musculoskeletal: Positive for joint pain.  Neurological: Negative for dizziness and headaches.   Exam: Physical Exam  Constitutional: She is oriented to person, place, and time.  HENT:  Nose: No mucosal edema.  Mouth/Throat: No oropharyngeal exudate or posterior oropharyngeal edema.  Eyes: Pupils are equal, round, and reactive to light. Conjunctivae, EOM and lids are normal.  Neck: No JVD present. Carotid bruit is not present. No edema present. No thyroid mass and no thyromegaly present.  Cardiovascular: S1 normal and S2 normal. Exam reveals no gallop.  No murmur heard. Pulses:      Dorsalis pedis pulses are 2+ on the right side, and 2+ on the left side.  Respiratory: No respiratory distress. She has no wheezes. She has no rhonchi. She has no rales.  GI: Soft. Bowel sounds are normal. There is no tenderness.  Musculoskeletal:       Right ankle:  She exhibits no swelling.       Left ankle: She exhibits no swelling.  Lymphadenopathy:    She has no cervical adenopathy.  Neurological: She is alert and oriented to person, place, and time. No cranial nerve deficit.  Skin: Skin is warm. No rash noted. Nails show no clubbing.  Psychiatric: She has a normal mood and affect.      Data Reviewed: Basic Metabolic Panel: Recent Labs  Lab 03/15/18 0252 03/19/18 1248 03/21/18 0443  NA 138 141 138  K 3.9 4.4 4.2  CL 106 109 107  CO2 24 24 25   GLUCOSE 220* 131* 156*  BUN 34* 29* 28*  CREATININE 1.90* 2.18* 1.65*  CALCIUM 9.9 9.9 9.8   Liver Function Tests: Recent Labs  Lab 03/15/18 0252 03/19/18 1248  AST 21 22  ALT 16 14  ALKPHOS 68 44  BILITOT 0.7 0.9  PROT 7.3 7.0  ALBUMIN 3.4* 3.3*   CBC: Recent Labs  Lab 03/15/18 0252 03/19/18 1248  WBC 5.4 6.9  NEUTROABS  --  4.4  HGB 10.1* 9.8*  HCT 32.0* 31.6*  MCV 90.4 91.9  PLT 256 257   Cardiac Enzymes: Recent Labs  Lab 03/15/18 0252 03/15/18 0721 03/19/18 1248 03/19/18 1941  TROPONINI <0.03 <0.03 0.03* <0.03    CBG: Recent Labs  Lab 03/20/18 1149 03/20/18 1705 03/20/18 2107 03/21/18 0751 03/21/18 1156  GLUCAP 165* 144* 164* 133* 121*    Recent Results (from the past 240 hour(s))  Stool culture  Status: None   Collection Time: 03/16/18 11:53 AM  Result Value Ref Range Status   MICRO NUMBER: [id number]  Final   SPECIMEN QUALITY: ADEQUATE  Final   SOURCE: STOOL  Final   STATUS: FINAL  Final   SHIGA RESULT: Not Detected  Final   MICRO NUMBER: [id number]  Final   SPECIMEN QUALITY: ADEQUATE  Final   Source STOOL  Final   STATUS: FINAL  Final   CAM RESULT: No enteric Campylobacter isolated  Final   MICRO NUMBER: [id number]  Final   SPECIMEN QUALITY: ADEQUATE  Final   SOURCE: STOOL  Final   STATUS: FINAL  Final   SS RESULT: No Salmonella or Shigella isolated  Final  Urine Culture     Status: Abnormal (Preliminary result)   Collection Time: 03/19/18   2:10 PM  Result Value Ref Range Status   Specimen Description   Final    URINE, RANDOM Performed at [facility], [address], [place], [place] [address]    Special Requests   Final    Normal Performed at [facility], [address], [place], [place] [address]    Culture >=100,000 COLONIES/mL KLEBSIELLA PNEUMONIAE (A)  Final   Report Status PENDING  Incomplete  C difficile quick scan w PCR reflex     Status: None   Collection Time: 03/19/18  9:49 PM  Result Value Ref Range Status   C Diff antigen NEGATIVE NEGATIVE Final   C Diff toxin NEGATIVE NEGATIVE Final   C Diff interpretation No C. difficile detected.  Final    Comment: Performed at [facility], [address] [place], [place] [address]  Gastrointestinal Panel by PCR , Stool     Status: None   Collection Time: 03/19/18  9:49 PM  Result Value Ref Range Status   Campylobacter species NOT DETECTED NOT DETECTED Final   Plesimonas shigelloides NOT DETECTED NOT DETECTED Final   Salmonella species NOT DETECTED NOT DETECTED Final   Yersinia enterocolitica NOT DETECTED NOT DETECTED Final   Vibrio species NOT DETECTED NOT DETECTED Final   Vibrio cholerae NOT DETECTED NOT DETECTED Final   Enteroaggregative E coli (EAEC) NOT DETECTED NOT DETECTED Final   Enteropathogenic E coli (EPEC) NOT DETECTED NOT DETECTED Final   Enterotoxigenic E coli (ETEC) NOT DETECTED NOT DETECTED Final   Shiga like toxin producing E coli (STEC) NOT DETECTED NOT DETECTED Final   Shigella/Enteroinvasive E coli (EIEC) NOT DETECTED NOT DETECTED Final   Cryptosporidium NOT DETECTED NOT DETECTED Final   Cyclospora cayetanensis NOT DETECTED NOT DETECTED Final   Entamoeba histolytica NOT DETECTED NOT DETECTED Final   Giardia lamblia NOT DETECTED NOT DETECTED Final   Adenovirus F40/41 NOT DETECTED NOT DETECTED Final   Astrovirus NOT DETECTED NOT DETECTED Final   Norovirus GI/GII NOT DETECTED NOT DETECTED Final   Rotavirus A NOT  DETECTED NOT DETECTED Final   Sapovirus (I, II, IV, and V) NOT DETECTED NOT DETECTED Final    Comment: Performed at [facility], [address] [place], [place] [address]     Studies: Ct Abdomen Pelvis Wo Contrast  Result Date: 03/19/2018 CLINICAL DATA:  Diarrhea past several days with abdominal pain. History of IV contrast allergy. EXAM: CT ABDOMEN AND PELVIS WITHOUT CONTRAST TECHNIQUE: Multidetector CT imaging of the abdomen and pelvis was performed following the standard protocol without IV contrast. COMPARISON:  06/03/2017, 05/23/2017 and 10/19/2015 FINDINGS: Lower chest: No airspace process or significant effusion. Few tiny pulmonary nodules within the lung  bases with the largest measuring 5 mm over the right lower lobe as these are unchanged from 05/23/2017. Heart is normal size. Calcification of the mitral valve annulus. Calcified plaque over the left main and 3 vessel coronary arteries. Calcified plaque over the thoracic aorta. There are calcified hilar mediastinal lymph nodes. Several bilateral pericardial phrenic lymph nodes unchanged. Several prominent lymph nodes adjacent the descending thoracic aorta unchanged. Hepatobiliary: Gallbladder not visualized. Liver and biliary tree are within normal. Pancreas: Normal. Spleen: Normal. Adrenals/Urinary Tract: Adrenal glands are within normal. Kidneys are normal size without hydronephrosis or nephrolithiasis. The ureters and bladder are unremarkable. Stomach/Bowel: Stomach and small bowel are normal. Appendix not visualized and likely has been resected. Minimal diverticulosis of the colon. Vascular/Lymphatic: Mild-to-moderate calcified plaque over the abdominal aorta. Moderate calcified plaque over the superior mesenteric artery. Stable small lymph nodes over the gastrohepatic ligament and upper periaortic region. Reproductive: Previous hysterectomy. Other: No free fluid or focal inflammatory change. Musculoskeletal: Degenerative change of  the spine and hips. IMPRESSION: No acute findings in the abdomen/pelvis. Few small nodules over the lung bases unchanged from 05/23/2017 and likely due to granulomatous disease. There are stable pericardial phrenic and upper abdominal nodes as described as well as multiple calcified hilar/mediastinal lymph nodes likely due to this presumed granulomatous process. Aortic Atherosclerosis (ICD10-I70.0). Atherosclerotic coronary artery disease. Minimal colonic diverticulosis. Electronically Signed   By: Marin Olp M.D.   On: 03/19/2018 15:48   Ct Head Wo Contrast  Result Date: 03/19/2018 CLINICAL DATA:  Near syncopal event. EXAM: CT HEAD WITHOUT CONTRAST CT CERVICAL SPINE WITHOUT CONTRAST TECHNIQUE: Multidetector CT imaging of the head and cervical spine was performed following the standard protocol without intravenous contrast. Multiplanar CT image reconstructions of the cervical spine were also generated. COMPARISON:  Head CT, 10/23/2015 FINDINGS: CT HEAD FINDINGS Brain: No evidence of acute infarction, hemorrhage, hydrocephalus, extra-axial collection or mass lesion/mass effect. Minor periventricular white matter hypoattenuation noted consistent with chronic microvascular ischemic change. Chronic basal gangliar calcifications. Vascular: No hyperdense vessel or unexpected calcification. Skull: Normal. Negative for fracture or focal lesion. Sinuses/Orbits: Globes and orbits are unremarkable. The visualized sinuses and mastoid air cells are clear. Other: None. CT CERVICAL SPINE FINDINGS Alignment: Mild kyphosis, apex at C5-C6.  No spondylolisthesis. Skull base and vertebrae: No acute fracture. No primary bone lesion or focal pathologic process. Soft tissues and spinal canal: No prevertebral fluid or swelling. No visible canal hematoma. Disc levels: Status post anterior cervical disc fusion C4 through C7. Bone graft material in the fusion plate protrudes anterior fuse vertebra, which appears chronic. Moderate loss  of disc height at C7-T1. Remaining unfused discs are well preserved in height. No disc herniation visualized. Upper chest: No masses or adenopathy. Dense carotid vascular calcifications. No acute findings. Lung apices are clear. Other: None. IMPRESSION: HEAD CT 1. No acute intracranial abnormalities. 2. Minor chronic microvascular ischemic change. CERVICAL CT 1. No fracture or acute finding. Electronically Signed   By: Lajean Manes M.D.   On: 03/19/2018 15:40   Ct Cervical Spine Wo Contrast  Result Date: 03/19/2018 CLINICAL DATA:  Near syncopal event. EXAM: CT HEAD WITHOUT CONTRAST CT CERVICAL SPINE WITHOUT CONTRAST TECHNIQUE: Multidetector CT imaging of the head and cervical spine was performed following the standard protocol without intravenous contrast. Multiplanar CT image reconstructions of the cervical spine were also generated. COMPARISON:  Head CT, 10/23/2015 FINDINGS: CT HEAD FINDINGS Brain: No evidence of acute infarction, hemorrhage, hydrocephalus, extra-axial collection or mass lesion/mass effect. Minor periventricular white matter hypoattenuation  noted consistent with chronic microvascular ischemic change. Chronic basal gangliar calcifications. Vascular: No hyperdense vessel or unexpected calcification. Skull: Normal. Negative for fracture or focal lesion. Sinuses/Orbits: Globes and orbits are unremarkable. The visualized sinuses and mastoid air cells are clear. Other: None. CT CERVICAL SPINE FINDINGS Alignment: Mild kyphosis, apex at C5-C6.  No spondylolisthesis. Skull base and vertebrae: No acute fracture. No primary bone lesion or focal pathologic process. Soft tissues and spinal canal: No prevertebral fluid or swelling. No visible canal hematoma. Disc levels: Status post anterior cervical disc fusion C4 through C7. Bone graft material in the fusion plate protrudes anterior fuse vertebra, which appears chronic. Moderate loss of disc height at C7-T1. Remaining unfused discs are well preserved in  height. No disc herniation visualized. Upper chest: No masses or adenopathy. Dense carotid vascular calcifications. No acute findings. Lung apices are clear. Other: None. IMPRESSION: HEAD CT 1. No acute intracranial abnormalities. 2. Minor chronic microvascular ischemic change. CERVICAL CT 1. No fracture or acute finding. Electronically Signed   By: Lajean Manes M.D.   On: 03/19/2018 15:40    Scheduled Meds: . ALPRAZolam  0.25 mg Oral Daily  . amLODipine  5 mg Oral Daily  . aspirin EC  81 mg Oral Daily  . carbamide peroxide  5 drop Right EAR BID  . dicyclomine  10 mg Oral TID AC & HS  . donepezil  10 mg Oral QHS  . enoxaparin (LOVENOX) injection  40 mg Subcutaneous Q24H  . feeding supplement (GLUCERNA SHAKE)  237 mL Oral BID BM  . insulin aspart  0-5 Units Subcutaneous QHS  . insulin aspart  0-9 Units Subcutaneous TID WC  . insulin aspart protamine- aspart  30 Units Subcutaneous BID WC  . isosorbide mononitrate  30 mg Oral Daily  . levofloxacin  250 mg Oral Daily  . mometasone-formoterol  2 puff Inhalation BID  . pregabalin  100 mg Oral BID  . rosuvastatin  20 mg Oral q1800  . ticagrelor  90 mg Oral BID   Continuous Infusions:   Assessment/Plan:  1. Dizziness and fall.  Patient is not orthostatic.  Physical therapy did recommend home with home health.  Asked nursing staff to walk around with her.  She did hit her head and could have some concussion symptoms with her fall.  Headache is better today. 2. Chronic diarrhea.  Stool studies are negative.  PRN Lomotil.  No further diarrhea today. 3. Acute kidney injury chronic kidney disease stage III.  Creatinine improved to 1.65 with IV fluid hydration 4. Acute cystitis and left tympanic membrane slightly erythematous.  Continue oral Levaquin.  Klebsiella growing on urine culture. 5. 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 Type 2 diabetes mellitus.  Decrease the dose of the 70/30  insulin. 7. Hypertension, sinus pause on telemetry yesterday.  Toprol discontinued.  Start low-dose Norvasc. 8. History of CAD on Brilinta aspirin Crestor 9. Hyperlipidemia unspecified on Crestor 10. 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 With fall and hit her head could have some concussion symptoms.  Code Status:  Code Status History    Date Active Date Inactive Code Status Order ID Comments User Context   06/04/2017 0151 06/04/2017 1944 Full Code [id number]  Lance Coon, MD Inpatient   05/24/2017 0129 05/26/2017 1926 Full Code [id number]  Lance Coon, MD Inpatient   11/14/2016 2249 11/15/2016 [place] Full Code [id number]  Harvie Bridge, DO ED   10/10/2016 1325 10/13/2016 2020 Full Code [id number]  Earnie Larsson, MD Inpatient  06/11/2016 2142 06/12/2016 1923 Full Code [id number]  Harvie Bridge, DO ED   01/02/2016 0524 01/02/2016 0914 Full Code [id number]  Saundra Shelling, MD Inpatient   06/26/2015 0736 06/29/2015 1954 Full Code [id number]  Demetrios Loll, MD ED   04/20/2015 0118 04/22/2015 1918 Full Code [id number]  Lance Coon, MD Inpatient     Family Communication: Spoke with husband on the phone yesterday.  Was called in the room when the niece was on the phone. Disposition Plan: Evaluate daily for potential disposition  Antibiotics:  Oral Levaquin  Time spent: 35 minutes  [place]

## 2018-03-21 NOTE — Care Management Note (Signed)
Case Management Note  Patient Details  [name] MRN: [id number] Date of Birth: [birth year]  Subjective/Objective:   PT recommending home with home health. Patient agreeable. Per her MD she is open for PT/RN services but pt is not aware to being open. If she is not open would prefer to use [facility], [place] notified of referral. If patient is open to another agency we will obtain resumption orders at discharge. Patient lives with husband. Uses rolling walker and bedside commode in the home.                Action/Plan:   Expected Discharge Date:  03/21/18               Expected Discharge Plan:     In-House Referral:     Discharge planning Services  CM Consult  Post Acute Care Choice:  Home Health Choice offered to:  Patient  DME Arranged:    DME Agency:     HH Arranged:    [place] Agency:  [facility]  Status of Service:  In process, will continue to follow  If discussed at Long Length of Stay Meetings, dates discussed:    Additional Comments:  Latanya Maudlin, RN 03/21/2018, 4:13 PM

## 2018-03-21 NOTE — Progress Notes (Signed)
Ambulate with patient using a walker as per MD request , patient tolerated

## 2018-03-22 ENCOUNTER — Ambulatory Visit: Payer: Medicare Other | Admitting: Physician Assistant

## 2018-03-22 DIAGNOSIS — B961 Klebsiella pneumoniae [K. pneumoniae] as the cause of diseases classified elsewhere: Secondary | ICD-10-CM | POA: Diagnosis not present

## 2018-03-22 DIAGNOSIS — R2681 Unsteadiness on feet: Secondary | ICD-10-CM | POA: Diagnosis not present

## 2018-03-22 DIAGNOSIS — E1122 Type 2 diabetes mellitus with diabetic chronic kidney disease: Secondary | ICD-10-CM | POA: Diagnosis not present

## 2018-03-22 DIAGNOSIS — R42 Dizziness and giddiness: Secondary | ICD-10-CM | POA: Diagnosis not present

## 2018-03-22 DIAGNOSIS — I130 Hypertensive heart and chronic kidney disease with heart failure and stage 1 through stage 4 chronic kidney disease, or unspecified chronic kidney disease: Secondary | ICD-10-CM | POA: Diagnosis not present

## 2018-03-22 DIAGNOSIS — I5032 Chronic diastolic (congestive) heart failure: Secondary | ICD-10-CM | POA: Diagnosis not present

## 2018-03-22 DIAGNOSIS — N184 Chronic kidney disease, stage 4 (severe): Secondary | ICD-10-CM | POA: Diagnosis not present

## 2018-03-22 DIAGNOSIS — N390 Urinary tract infection, site not specified: Secondary | ICD-10-CM | POA: Diagnosis not present

## 2018-03-22 DIAGNOSIS — I255 Ischemic cardiomyopathy: Secondary | ICD-10-CM | POA: Diagnosis not present

## 2018-03-22 DIAGNOSIS — R41841 Cognitive communication deficit: Secondary | ICD-10-CM | POA: Diagnosis not present

## 2018-03-22 DIAGNOSIS — N179 Acute kidney failure, unspecified: Secondary | ICD-10-CM | POA: Diagnosis not present

## 2018-03-22 DIAGNOSIS — R51 Headache: Secondary | ICD-10-CM | POA: Diagnosis not present

## 2018-03-22 DIAGNOSIS — J90 Pleural effusion, not elsewhere classified: Secondary | ICD-10-CM | POA: Diagnosis not present

## 2018-03-22 DIAGNOSIS — M6281 Muscle weakness (generalized): Secondary | ICD-10-CM | POA: Diagnosis not present

## 2018-03-22 DIAGNOSIS — N3000 Acute cystitis without hematuria: Secondary | ICD-10-CM | POA: Diagnosis not present

## 2018-03-22 DIAGNOSIS — R198 Other specified symptoms and signs involving the digestive system and abdomen: Secondary | ICD-10-CM | POA: Diagnosis not present

## 2018-03-22 DIAGNOSIS — D869 Sarcoidosis, unspecified: Secondary | ICD-10-CM | POA: Diagnosis not present

## 2018-03-22 DIAGNOSIS — G4733 Obstructive sleep apnea (adult) (pediatric): Secondary | ICD-10-CM | POA: Diagnosis not present

## 2018-03-22 DIAGNOSIS — R918 Other nonspecific abnormal finding of lung field: Secondary | ICD-10-CM | POA: Diagnosis not present

## 2018-03-22 DIAGNOSIS — N183 Chronic kidney disease, stage 3 (moderate): Secondary | ICD-10-CM | POA: Diagnosis not present

## 2018-03-22 DIAGNOSIS — R55 Syncope and collapse: Secondary | ICD-10-CM | POA: Diagnosis not present

## 2018-03-22 DIAGNOSIS — E1149 Type 2 diabetes mellitus with other diabetic neurological complication: Secondary | ICD-10-CM | POA: Diagnosis not present

## 2018-03-22 DIAGNOSIS — D860 Sarcoidosis of lung: Secondary | ICD-10-CM | POA: Diagnosis not present

## 2018-03-22 DIAGNOSIS — K529 Noninfective gastroenteritis and colitis, unspecified: Secondary | ICD-10-CM | POA: Diagnosis not present

## 2018-03-22 DIAGNOSIS — R5381 Other malaise: Secondary | ICD-10-CM | POA: Diagnosis not present

## 2018-03-22 DIAGNOSIS — E1140 Type 2 diabetes mellitus with diabetic neuropathy, unspecified: Secondary | ICD-10-CM | POA: Diagnosis not present

## 2018-03-22 LAB — GLUCOSE, CAPILLARY
GLUCOSE-CAPILLARY: 106 mg/dL — AB (ref 70–99)
GLUCOSE-CAPILLARY: 164 mg/dL — AB (ref 70–99)
Glucose-Capillary: 157 mg/dL — ABNORMAL HIGH (ref 70–99)

## 2018-03-22 LAB — BASIC METABOLIC PANEL
ANION GAP: 10 (ref 5–15)
BUN: 24 mg/dL — AB (ref 8–23)
CO2: 25 mmol/L (ref 22–32)
Calcium: 10.5 mg/dL — ABNORMAL HIGH (ref 8.9–10.3)
Chloride: 101 mmol/L (ref 98–111)
Creatinine, Ser: 1.55 mg/dL — ABNORMAL HIGH (ref 0.44–1.00)
GFR calc Af Amer: 36 mL/min — ABNORMAL LOW (ref >60)
GFR calc non Af Amer: 31 mL/min — ABNORMAL LOW (ref >60)
GLUCOSE: 190 mg/dL — AB (ref 70–99)
POTASSIUM: 4.6 mmol/L (ref 3.5–5.1)
Sodium: 136 mmol/L (ref 135–145)

## 2018-03-22 LAB — URINE CULTURE: Special Requests: NORMAL

## 2018-03-22 LAB — URIC ACID: Uric Acid, Serum: 7.7 mg/dL — ABNORMAL HIGH (ref 2.5–7.1)

## 2018-03-22 MED ORDER — CARBAMIDE PEROXIDE 6.5 % OT SOLN: 5.0000 [drp] | Freq: Two times a day (BID) | OTIC | 0 refills | Status: DC

## 2018-03-22 MED ORDER — ALPRAZOLAM 0.25 MG PO TABS: 0.2500 mg | ORAL_TABLET | Freq: Every day | ORAL | 0 refills | Status: DC

## 2018-03-22 MED ORDER — DIPHENOXYLATE-ATROPINE 2.5-0.025 MG PO TABS: 1.0000 | ORAL_TABLET | Freq: Four times a day (QID) | ORAL | 0 refills | Status: DC | PRN

## 2018-03-22 MED ORDER — PREGABALIN 50 MG PO CAPS: 100.0000 mg | ORAL_CAPSULE | Freq: Three times a day (TID) | ORAL | Status: DC

## 2018-03-22 MED ORDER — OXYCODONE HCL 5 MG PO TABS
5.0000 mg | ORAL_TABLET | Freq: Four times a day (QID) | ORAL | Status: DC | PRN
  Administered 2018-03-22: 5 mg via ORAL
  Filled 2018-03-22: qty 1

## 2018-03-22 MED ORDER — LEVOFLOXACIN 250 MG PO TABS: 250.0000 mg | ORAL_TABLET | Freq: Every day | ORAL | 0 refills | Status: DC

## 2018-03-22 MED ORDER — AMLODIPINE BESYLATE 5 MG PO TABS: 5.0000 mg | ORAL_TABLET | Freq: Every day | ORAL | 0 refills | Status: DC

## 2018-03-22 MED ORDER — ALLOPURINOL 100 MG PO TABS: 100.0000 mg | ORAL_TABLET | Freq: Every day | ORAL | 0 refills | Status: DC

## 2018-03-22 MED ORDER — INSULIN ASPART PROT & ASPART (70-30 MIX) 100 UNIT/ML ~~LOC~~ SUSP: 30.0000 [IU] | Freq: Two times a day (BID) | SUBCUTANEOUS | 11 refills | Status: DC

## 2018-03-22 MED ORDER — METHYLPREDNISOLONE SODIUM SUCC 40 MG IJ SOLR
20.0000 mg | Freq: Once | INTRAMUSCULAR | Status: AC
  Administered 2018-03-22: 20 mg via INTRAVENOUS
  Filled 2018-03-22: qty 1

## 2018-03-22 MED ORDER — PREGABALIN 100 MG PO CAPS: 100.0000 mg | ORAL_CAPSULE | Freq: Two times a day (BID) | ORAL | 0 refills | Status: DC

## 2018-03-22 MED ORDER — OXYCODONE HCL 5 MG PO TABS: 5.0000 mg | ORAL_TABLET | Freq: Four times a day (QID) | ORAL | 0 refills | Status: DC | PRN

## 2018-03-22 NOTE — NC FL2 (Signed)
[place] LEVEL OF CARE SCREENING TOOL     IDENTIFICATION  Patient [name] Birthdate: [birth year] Sex: female Admission Date (Current Location): 03/19/2018  [place] and [place] Number:  Engineering geologist and Address:  [facility], [address] [place], [place] [address]      Provider Number: [id number]  Attending Physician Name and Address:  Loletha Grayer, MD  Relative Name and Phone Number:  Jodelle Fausto- husband [phone]    Current Level of Care: Hospital Recommended Level of Care: [place] Prior Approval Number:    Date Approved/Denied:   PASRR Number: [id number]  Discharge Plan: SNF    Current Diagnoses: Patient Active Problem List   Diagnosis Date Noted  . Near syncope 03/19/2018  . Functional diarrhea 03/13/2018  . Vitamin a deficiency, unspecified 02/22/2018  . Hypercalcemia 02/20/2018  . Environmental and seasonal allergies 01/15/2018  . Post-nasal drip 01/15/2018  . Restless legs 10/17/2017  . Leg pain 10/15/2017  . Normocytic anemia, not due to blood loss 09/27/2017  . Melena 09/25/2017  . Cognitive decline 08/16/2017  . Polypharmacy 07/28/2017  . Pneumonia 05/26/2017  . Pill esophagitis 05/23/2017  . CKD (chronic kidney disease) stage 4, GFR 15-29 ml/min (HCC) 11/15/2016  . History of fall 11/12/2016  . Osteoarthritis 11/10/2016  . Right arm pain 10/15/2016  . Cervical stenosis of spinal canal 10/10/2016  . Cervical radiculopathy at C7 07/02/2016  . Epigastric hernia 04/01/2016  . Umbilical pain 54/00/8676  . CAD in native artery   . Type 2 DM with CKD stage 3 and hypertension ([place])   . Chronic renal insufficiency   . Hypertensive heart disease   . OSA on CPAP 07/07/2015  . Hospital discharge follow-up 07/07/2015  . Chest pain 06/29/2015  . Pleural effusion, right   . Chronic diastolic CHF (congestive heart failure) ([place]) 04/18/2015  . Diabetic neuropathy  ([place]) 02/21/2015  . Benign neoplasm of sigmoid colon   . Low back pain with radiation 04/04/2014  . Essential hypertension 04/04/2014  . Hyperlipidemia 02/23/2014  . Long-term use of high-risk medication 02/23/2014  . Other long term (current) drug therapy 02/23/2014  . Dermatophytic onychia 08/17/2013  . Stable angina ([place]) 08/17/2013  . Cerebral infarct ([place]) 08/17/2013  . Bony exostosis 08/17/2013  . Obesity, unspecified 08/17/2013  . Arthritis 08/05/2013  . Unsteadiness on feet 03/24/2013  . History of colonic polyps 02/20/2013  . Sarcoidosis ([place]) 12/07/2012  . Dysphagia 12/07/2012  . Pulmonary nodule seen on imaging study 05/20/2012  . 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 Diabetic nephropathy associated with type 2 diabetes mellitus ([place]) 02/29/2012  . Cardiomyopathy, ischemic   . Hepatic steatosis   . Osteoporosis, post-menopausal   . Peripheral vascular disease due to secondary diabetes mellitus ([place])   . Double vessel coronary artery disease 12/14/2011    Orientation RESPIRATION BLADDER Height & Weight     Self, Time, Place  Normal Continent Weight: 187 lb 12.8 oz (85.2 kg) Height:  5\' 7"  (170.2 cm)  BEHAVIORAL SYMPTOMS/MOOD NEUROLOGICAL BOWEL NUTRITION STATUS  (none) (none) Continent Diet(Heart Healthy )  AMBULATORY STATUS COMMUNICATION OF NEEDS Skin   Extensive Assist Verbally Normal                       Personal Care Assistance Level of Assistance  Bathing, Feeding, Dressing Bathing Assistance: Limited assistance Feeding assistance: Independent Dressing Assistance: Limited assistance     Functional Limitations Info  Sight, Hearing, Speech Sight  Info: Adequate Hearing Info: Adequate Speech Info: Adequate    SPECIAL CARE FACTORS FREQUENCY  PT (By licensed PT), OT (By licensed OT)     PT Frequency: 5 OT Frequency: 5            Contractures Contractures Info: Not present    Additional Factors Info  Code Status, Allergies Code Status  Info: Full Code  Allergies Info: Cephalexin, Contrast Media Iodinated Diagnostic Agents, Doxycycline, Gabapentin, Sulfa Antibiotics           Current Medications (03/22/2018):  This is the current hospital active medication list Current Facility-Administered Medications  Medication Dose Route Frequency Provider Last Rate Last Dose  . acetaminophen (TYLENOL) tablet 1,000 mg  1,000 mg Oral Q6H PRN Loletha Grayer, MD   1,000 mg at 03/20/18 1116  . ALPRAZolam [name]) tablet 0.25 mg  0.25 mg Oral Daily Loletha Grayer, MD   0.25 mg at 03/21/18 0847  . amLODipine (NORVASC) tablet 5 mg  5 mg Oral Daily Wieting, Richard, MD      . aspirin EC tablet 81 mg  81 mg Oral Daily Loletha Grayer, MD   81 mg at 03/21/18 0848  . carbamide peroxide (DEBROX) 6.5 % OTIC (EAR) solution 5 drop  5 drop Right EAR BID Loletha Grayer, MD   5 drop at 03/21/18 2259  . dicyclomine (BENTYL) capsule 10 mg  10 mg Oral TID AC & HS Loletha Grayer, MD   10 mg at 03/22/18 0830  . diphenoxylate-atropine (LOMOTIL) 2.5-0.025 MG per tablet 1 tablet  1 tablet Oral QID PRN Wieting, Richard, MD      . donepezil (ARICEPT) tablet 10 mg  10 mg Oral QHS Loletha Grayer, MD   10 mg at 03/21/18 2253  . enoxaparin (LOVENOX) injection 40 mg  40 mg Subcutaneous Q24H Marcelle Overlie D, RPH   40 mg at 03/21/18 1742  . feeding supplement (GLUCERNA SHAKE) (GLUCERNA SHAKE) liquid 237 mL  237 mL Oral BID BM Loletha Grayer, MD   237 mL at 03/21/18 1358  . fluticasone (FLONASE) 50 MCG/ACT nasal spray 2 spray  2 spray Each Nare Daily PRN Wieting, Richard, MD      . insulin aspart (novoLOG) injection 0-5 Units  0-5 Units Subcutaneous QHS Wieting, Richard, MD      . insulin aspart (novoLOG) injection 0-9 Units  0-9 Units Subcutaneous TID WC Loletha Grayer, MD   2 Units at 03/21/18 1740  . insulin aspart protamine- aspart (NOVOLOG MIX 70/30) injection 30 Units  30 Units Subcutaneous BID WC Loletha Grayer, MD   30 Units at 03/22/18 0830  .  isosorbide mononitrate (IMDUR) 24 hr tablet 30 mg  30 mg Oral Daily Loletha Grayer, MD   30 mg at 03/21/18 0848  . levofloxacin (LEVAQUIN) tablet 250 mg  250 mg Oral Daily Loletha Grayer, MD   250 mg at 03/21/18 0848  . mometasone-formoterol (DULERA) 200-5 MCG/ACT inhaler 2 puff  2 puff Inhalation BID Loletha Grayer, MD   2 puff at 03/21/18 2257  . nitroGLYCERIN (NITROLINGUAL) 0.4 MG/SPRAY spray 1 spray  1 spray Sublingual Q5 Min x 3 PRN Wieting, Richard, MD      . oxyCODONE (Oxy IR/ROXICODONE) immediate release tablet 5 mg  5 mg Oral Q8H PRN Loletha Grayer, MD   5 mg at 03/22/18 0457  . pregabalin (LYRICA) capsule 100 mg  100 mg Oral BID Loletha Grayer, MD   100 mg at 03/22/18 0911  . ticagrelor (BRILINTA) tablet 90 mg  90 mg Oral  BID Loletha Grayer, MD   90 mg at 03/21/18 2253   Facility-Administered Medications Ordered in Other Encounters  Medication Dose Route Frequency Provider Last Rate Last Dose  . albuterol (PROVENTIL) (5 MG/ML) 0.5% nebulizer solution 2.5 mg  2.5 mg Nebulization Once [name], MD         Discharge Medications: Please see discharge summary for a list of discharge medications.  Relevant Imaging Results:  Relevant Lab Results:   Additional Information SSn: [id number]  Annamaria Boots, [place]

## 2018-03-22 NOTE — Care Management Note (Signed)
Case Management Note  Patient Details  [name] MRN: [id number] Date of Birth: 1941/01/04  Subjective/Objective:    Patient is discharging to [facility] today.     Notified [place] with [facility].               Action/Plan:   Expected Discharge Date:  03/22/18               Expected Discharge Plan:  [place]  In-House Referral:     Discharge planning Services  CM Consult  Post Acute Care Choice:  Home Health Choice offered to:  Patient  DME Arranged:    DME Agency:     HH Arranged:    [place] Agency:  [facility]  Status of Service:  Completed, signed off  If discussed at [place] of Stay Meetings, dates discussed:    Additional Comments:  Elza Rafter, RN 03/22/2018, 4:24 PM

## 2018-03-22 NOTE — Discharge Summary (Signed)
[facility] [name]    MR#:  [id number]  [place] OF BIRTH:  01-10-1941  DATE OF ADMISSION:  03/19/2018 ADMITTING PHYSICIAN: Loletha Grayer, MD  DATE OF DISCHARGE: 03/22/2018  PRIMARY CARE PHYSICIAN: [name], MD    ADMISSION DIAGNOSIS:  Near syncope [R55]  DISCHARGE DIAGNOSIS:  Active Problems:   Near syncope   SECONDARY DIAGNOSIS:   Past Medical History:  Diagnosis Date  . Acute respiratory failure ([place]) 11/21/2014  . Anxiety   . Arthritis 08/05/2013  . Atherosclerotic peripheral vascular disease with gangrene [facility]) august 2012  . Cardiomyopathy, ischemic    a. EF 35 to 40% by echo in 2013 b. EF improved to 50-55% by echo in 04/2015; 11/2016 Echo: EF 55-60%, Gr1 DD, mildly dil LA, nl RV fxn.  . Cerebral infarct ([place]) 08/17/2013  . Chronic diastolic CHF (congestive heart failure) (HCC)    a. EF 50-55% by echo in 04/2015; b. 11/2016 Echo: EF 55-60%, Gr1 DD.  [name] COPD (chronic obstructive pulmonary disease) ([place])   . Coronary artery disease, occlusive    a. Previous PCI to the LAD, LCx, and RCA in 2010, 2011, 2013, and 2016. All performed in [place]; b. 11/2016 Lexiscan MV: EF 43%, no ischemia->low risk.  . 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 Diabetic diarrhea ([place]) 10/03/2014  . DM type 2, uncontrolled, with renal complications ([place]) 06/19/3149  . Hepatic steatosis    by CT abd pelvis  . History of kidney stones    at a younger age  . Hyperlipidemia LDL goal <100 02/23/2014  . Hypertensive heart disease   . Osteoporosis, post-menopausal   . 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 Pill esophagitis   . Pleural effusion 10/25/2012   10/2012 CT chest >> small to moderate R lung effusion>> chylothorax, 100% lymphs 10/2013 thoracentesis> cytology negative, WBC 1471, > 90% "small lymphs" 01/2014 CT chest> near complete resolution of pleural  effusion, stable lymphadenopathy 08/2014 CT chest [place] ([facility]): small right sided effusion decreased in size, stable mediastinal lymphadenopathy 1.0cm largest, 18m . 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 Sleep apnea   . Stage III chronic kidney disease ([place]     HOSPITAL COURSE:   1.  Dizziness and fall.  The patient was not orthostatic on presentation but was a little dehydrated.  The patient hit her head with the fall and likely has some concussion symptoms with headaches.  Her headaches have improved over the few days that she has been here in the hospital. 2.  Chronic diarrhea.  Stool studies were negative.  PRN Lomotil.  No further diarrhea over the last couple days. 3.  Acute kidney injury on chronic kidney disease stage III.  I was able to get her creatinine better with IV fluid hydration and holding Lasix.  Creatinine has improved from 2.18 down to 1.55.  I held the ARB and Lasix. 4.  Sinus pause.  Her Toprol was held and no further pauses seen on the monitor. 5.  Acute cystitis and left tympanic membrane slightly erythematous.  Continue oral Levaquin for few  more days.  Klebsiella growing on the urine culture. 6.  History of sarcoidosis with chronic granulomatous disease seen on CT scan. 7.  Type 2 diabetes mellitus I changed her insulin to 70/30 insulin and decreased it down to 30 units twice a day.  Continue to check fingersticks q. before meals and nightly. 8.  Hypertension.  I added Norvasc 5 mg daily.  I stop Toprol secondary to sinus pause.  I stopped Lasix and ARB secondary to acute kidney injury.  Check orthostatics vital signs.  Would rather have her blood pressure little on the higher side than too low. 9.  History of CAD  on Brilinta, aspirin.  I held Crestor with her weakness.  Can restart Crestor in a week if still complaining of weakness that is not due to the Crestor. 10.  Hyperlipidemia unspecified.  Holding Crestor giving a holiday on the statin. 11.  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  Right lower extremity pain likely diabetic neuropathy.  I had to decrease her Lyrica dose secondary to her kidney function.  Since her kidney function improved I increased her Lyrica back to 100 mg twice a day.  I also did give a dose of Solu-Medrol just in case this was gout and low-dose allopurinol upon discharge home. 13.  Continue physical therapy at rehab.  Family concerned about her going home.  They may have to look into other options after rehab is completed.  DISCHARGE CONDITIONS:   Satisfactory  CONSULTS OBTAINED:  None  DRUG ALLERGIES:   Allergies  Allergen Reactions  . Cephalexin Itching  . Contrast Media [Iodinated Diagnostic Agents] Itching  . Doxycycline Itching  . Gabapentin Itching  . Sulfa Antibiotics Itching    DISCHARGE MEDICATIONS:   Allergies as of 03/22/2018      Reactions   Cephalexin Itching   Contrast Media [iodinated Diagnostic Agents] Itching   Doxycycline Itching   Gabapentin Itching   Sulfa Antibiotics Itching      Medication List    STOP taking these medications   furosemide 20 MG tablet Commonly known as:  LASIX   Insulin Lispro Prot & Lispro (75-25) 100 UNIT/ML Kwikpen Commonly known as:  HUMALOG 75/25 MIX   losartan 100 MG tablet Commonly known as:  COZAAR   metoprolol succinate 25 MG 24 hr tablet Commonly known as:  TOPROL-XL   rosuvastatin 20 MG tablet Commonly known as:  CRESTOR     TAKE these medications   acetaminophen 500 MG tablet Commonly known as:  TYLENOL Take 1,000 mg by mouth every 6 (six) hours as needed for mild pain or headache.   allopurinol 100 MG tablet Commonly known as:  ZYLOPRIM Take 1 tablet (100 mg total) by mouth daily.   ALPRAZolam  0.25 MG tablet Commonly known as:  XANAX Take 1 tablet (0.25 mg total) by mouth daily.   amLODipine 5 MG tablet Commonly known as:  NORVASC Take 1 tablet (5 mg total) by mouth daily. Start taking on:  03/23/2018   aspirin EC 81 MG tablet Take 81 mg by mouth daily.   B-D UF III MINI PEN NEEDLES 31G X 5 MM Misc Generic drug:  Insulin Pen Needle USE THREE TIMES A DAY   blood glucose meter kit and supplies Dispense based on patient and insurance preference. Use up to three times daily as directed. (FOR ICD-10 E11.21)   BRILINTA 90 MG Tabs tablet Generic drug:  ticagrelor Take 1 tablet (90 mg total) by mouth 2 (two) times daily.   carbamide  peroxide 6.5 % OTIC solution Commonly known as:  DEBROX Place 5 drops into the right ear 2 (two) times daily.   dicyclomine 10 MG capsule Commonly known as:  BENTYL Take 1 capsule (10 mg total) by mouth 4 (four) times daily -  before meals and at bedtime.   diphenoxylate-atropine 2.5-0.025 MG tablet Commonly known as:  LOMOTIL Take 1 tablet by mouth 4 (four) times daily as needed for diarrhea or loose stools.   donepezil 10 MG tablet Commonly known as:  ARICEPT Take 10 mg by mouth at bedtime.   feeding supplement (GLUCERNA SHAKE) Liqd Take 237 mLs by mouth 2 (two) times daily between meals.   fluticasone 50 MCG/ACT nasal spray Commonly known as:  FLONASE Place 2 sprays into both nostrils daily as needed for rhinitis.   glucose blood test strip Use to check blood sugars 2 times daily   glucose blood test strip USE TO CHECK GLUCOSE THREE TIMES DAILY   insulin aspart protamine- aspart (70-30) 100 UNIT/ML injection Commonly known as:  NOVOLOG MIX 70/30 Inject 0.3 mLs (30 Units total) into the skin 2 (two) times daily with a meal.   isosorbide mononitrate 30 MG 24 hr tablet Commonly known as:  IMDUR Take 1 tablet (30 mg total) by mouth daily.   levofloxacin 250 MG tablet Commonly known as:  LEVAQUIN Take 1 tablet (250 mg total)  by mouth daily. Start taking on:  03/23/2018   mometasone-formoterol 200-5 MCG/ACT Aero Commonly known as:  DULERA Inhale 2 puffs into the lungs 2 (two) times daily.   nitroGLYCERIN 0.4 MG/SPRAY spray Commonly known as:  NITROLINGUAL Place 1 spray under the tongue every 5 (five) minutes x 3 doses as needed for chest pain.   oxyCODONE 5 MG immediate release tablet Commonly known as:  Oxy IR/ROXICODONE Take 1 tablet (5 mg total) by mouth every 6 (six) hours as needed for moderate pain.   pregabalin 100 MG capsule Commonly known as:  LYRICA Take 1 capsule (100 mg total) by mouth 2 (two) times daily. What changed:  when to take this        DISCHARGE INSTRUCTIONS:   Follow-up with Dr. at rehab 1 to 2 days  If you experience worsening of your admission symptoms, develop shortness of breath, life threatening emergency, suicidal or homicidal thoughts you must seek medical attention immediately by calling 911 or calling your MD immediately  if symptoms less severe.  You Must read complete instructions/literature along with all the possible adverse reactions/side effects for all the Medicines you take and that have been prescribed to you. Take any new Medicines after you have completely understood and accept all the possible adverse reactions/side effects.   Please note  You were cared for by a hospitalist during your hospital stay. If you have any questions about your discharge medications or the care you received while you were in the hospital after you are discharged, you can call the unit and asked to speak with the hospitalist on call if the hospitalist that took care of you is not available. Once you are discharged, your primary care physician will handle any further medical issues. Please note that NO REFILLS for any discharge medications will be authorized once you are discharged, as it is imperative that you return to your primary care physician (or establish a relationship with a  primary care physician if you do not have one) for your aftercare needs so that they can reassess your need for medications and monitor your lab  values.    Today   CHIEF COMPLAINT:   Chief Complaint  Patient presents with  . Near Syncope    HISTORY OF PRESENT ILLNESS:  Tenessa Marsee  is a 77 y.o. female presented after a fall and dizziness   VITAL SIGNS:  Blood pressure (!) 154/57, pulse 63, temperature 98.4 F (36.9 C), temperature source Oral, resp. rate 19, height 5' 7" (1.702 m), weight 85.2 kg, SpO2 98 %.   PHYSICAL EXAMINATION:  GENERAL:  77 y.o.-year-old patient lying in the bed with no acute distress.  EYES: Pupils equal, round, reactive to light and accommodation. No scleral icterus. Extraocular muscles intact.  HEENT: Head atraumatic, normocephalic. Oropharynx and nasopharynx clear.  NECK:  Supple, no jugular venous distention. No thyroid enlargement, no tenderness.  LUNGS: Normal breath sounds bilaterally, no wheezing, rales,rhonchi or crepitation. No use of accessory muscles of respiration.  CARDIOVASCULAR: S1, S2 normal. No murmurs, rubs, or gallops.  ABDOMEN: Soft, non-tender, non-distended. Bowel sounds present. No organomegaly or mass.  EXTREMITIES: No pedal edema, cyanosis, or clubbing.  NEUROLOGIC: Cranial nerves II through XII are intact. Muscle strength 5/5 in all extremities. Sensation intact. Gait not checked.  PSYCHIATRIC: The patient is alert and oriented x 3.  SKIN: No obvious rash, lesion, or ulcer.   DATA REVIEW:   CBC Recent Labs  Lab 03/19/18 1248  WBC 6.9  HGB 9.8*  HCT 31.6*  PLT 257    Chemistries  Recent Labs  Lab 03/19/18 1248  03/22/18 0916  NA 141   < > 136  K 4.4   < > 4.6  CL 109   < > 101  CO2 24   < > 25  GLUCOSE 131*   < > 190*  BUN 29*   < > 24*  CREATININE 2.18*   < > 1.55*  CALCIUM 9.9   < > 10.5*  AST 22  --   --   ALT 14  --   --   ALKPHOS 44  --   --   BILITOT 0.9  --   --    < > = values in this interval  not displayed.    Cardiac Enzymes Recent Labs  Lab 03/19/18 1941  TROPONINI <0.03    Microbiology Results  Results for orders placed or performed during the hospital encounter of 03/19/18  Urine Culture     Status: Abnormal   Collection Time: 03/19/18  2:10 PM  Result Value Ref Range Status   Specimen Description   Final    URINE, RANDOM Performed at [facility], [address] [place], [place] [address]    Special Requests   Final    Normal Performed at [facility], [address] [place], [place] [address]    Culture >=100,000 COLONIES/mL KLEBSIELLA PNEUMONIAE (A)  Final   Report Status 03/22/2018 FINAL  Final   Organism ID, Bacteria KLEBSIELLA PNEUMONIAE (A)  Final      Susceptibility   Klebsiella pneumoniae - MIC*    AMPICILLIN >=32 RESISTANT Resistant     CEFAZOLIN <=4 SENSITIVE Sensitive     CEFTRIAXONE <=1 SENSITIVE Sensitive     CIPROFLOXACIN <=0.25 SENSITIVE Sensitive     GENTAMICIN <=1 SENSITIVE Sensitive     IMIPENEM <=0.25 SENSITIVE Sensitive     NITROFURANTOIN 32 SENSITIVE Sensitive     TRIMETH/SULFA <=20 SENSITIVE Sensitive     AMPICILLIN/SULBACTAM 4 SENSITIVE Sensitive     PIP/TAZO <=4 SENSITIVE Sensitive     Extended ESBL NEGATIVE Sensitive     * >=  100,000 COLONIES/mL KLEBSIELLA PNEUMONIAE  C difficile quick scan w PCR reflex     Status: None   Collection Time: 03/19/18  9:49 PM  Result Value Ref Range Status   C Diff antigen NEGATIVE NEGATIVE Final   C Diff toxin NEGATIVE NEGATIVE Final   C Diff interpretation No C. difficile detected.  Final    Comment: Performed at [facility], [address] [place], [place] [address]  Gastrointestinal Panel by PCR , Stool     Status: None   Collection Time: 03/19/18  9:49 PM  Result Value Ref Range Status   Campylobacter species NOT DETECTED NOT DETECTED Final   Plesimonas shigelloides NOT DETECTED NOT DETECTED Final   Salmonella species NOT DETECTED NOT DETECTED Final    Yersinia enterocolitica NOT DETECTED NOT DETECTED Final   Vibrio species NOT DETECTED NOT DETECTED Final   Vibrio cholerae NOT DETECTED NOT DETECTED Final   Enteroaggregative E coli (EAEC) NOT DETECTED NOT DETECTED Final   Enteropathogenic E coli (EPEC) NOT DETECTED NOT DETECTED Final   Enterotoxigenic E coli (ETEC) NOT DETECTED NOT DETECTED Final   Shiga like toxin producing E coli (STEC) NOT DETECTED NOT DETECTED Final   Shigella/Enteroinvasive E coli (EIEC) NOT DETECTED NOT DETECTED Final   Cryptosporidium NOT DETECTED NOT DETECTED Final   Cyclospora cayetanensis NOT DETECTED NOT DETECTED Final   Entamoeba histolytica NOT DETECTED NOT DETECTED Final   Giardia lamblia NOT DETECTED NOT DETECTED Final   Adenovirus F40/41 NOT DETECTED NOT DETECTED Final   Astrovirus NOT DETECTED NOT DETECTED Final   Norovirus GI/GII NOT DETECTED NOT DETECTED Final   Rotavirus A NOT DETECTED NOT DETECTED Final   Sapovirus (I, II, IV, and V) NOT DETECTED NOT DETECTED Final    Comment: Performed at [facility], [address] [place], [place] [address]   *Note: Due to a large number of results and/or encounters for the requested time period, some results have not been displayed. A complete set of results can be found in Results Review.     Management plans discussed with the patient, family  (niece on the phone) and they are in agreement.  CODE STATUS:  Code Status History    Date Active Date Inactive Code Status Order ID Comments User Context   06/04/2017 0151 06/04/2017 1944 Full Code [id number]  Lance Coon, MD Inpatient   05/24/2017 0129 05/26/2017 1926 Full Code [id number]  Lance Coon, MD Inpatient   11/14/2016 2249 11/15/2016 [place] Full Code [id number]  [place], Ubaldo Glassing, DO ED   10/10/2016 1325 10/13/2016 2020 Full Code [id number]  Earnie Larsson, MD Inpatient   06/11/2016 2142 06/12/2016 1923 Full Code [id number]  [place], Ubaldo Glassing, DO ED   01/02/2016 0524 01/02/2016 0914 Full Code [id number]  Saundra Shelling, MD Inpatient   06/26/2015 0736 06/29/2015 1954 Full Code [id number]  Demetrios Loll, MD ED   04/20/2015 0118 04/22/2015 1918 Full Code [id number]  Lance Coon, MD Inpatient      TOTAL TIME TAKING CARE OF THIS PATIENT: 35 minutes.    Loletha Grayer M.D on 03/22/2018 at 2:55 PM  Between 7am to 6pm - Pager - [phone]  After 6pm go to [email or web address] - password [email or web address]  [employer] Office  [phone]  CC: Primary care physician; [name], MD

## 2018-03-22 NOTE — Progress Notes (Signed)
Pt will be discharged to [facility]. Report called to [place] at facility. EMS notified of transport. IV discontinued without incident telemetry box returned to desk.  I will continue to assess.

## 2018-03-22 NOTE — Progress Notes (Signed)
Patient requested for pain medication for ear aches. After administering Oxycodone, patient stated  she's having mild chest pain. This RN informed the patient to wait and see the effect of the oxycodone. On reassessment in 30 minutes patient was resting comfortably with her eyes closed, respirations even and unlabored. Will continue to monitor.

## 2018-03-22 NOTE — Clinical Social Work Note (Signed)
Patient is medically ready for discharge today. CSW notified patient that she will discharge to [facility] today. CSW notified Neoma Laming at [facility] of discharge today. Patient will be transported by EMS. RN to call report and call for transport.   [place], [place]

## 2018-03-22 NOTE — Clinical Social Work Note (Signed)
Clinical Social Work Assessment  Patient Details  [name] MRN: [id number] Date of Birth: [birth year]  Date of referral:  03/22/18               Reason for consult:  Facility Placement                Permission sought to share information with:  Case Manager, Customer service manager, Family Supports Permission granted to share information::  Yes, Verbal Permission Granted  Name::      SNF  Agency::   [facility]   Relationship::     Contact Information:     Housing/Transportation Living arrangements for the past 2 months:  [place] of Information:  Patient Patient Interpreter Needed:  None Criminal Activity/Legal Involvement Pertinent to Current Situation/Hospitalization:  No - Comment as needed Significant Relationships:  Spouse Lives with:  Spouse Do you feel safe going back to the place where you live?  No Need for family participation in patient care:  Yes (Comment)  Care giving concerns:  Patient lives with husband in [place]    Social Worker assessment / plan:  CSW consulted for SNF placement. CSW met with patient to discuss discharge plan. PT has recommended home with home health but patient states that she can not return home because she takes care of her husband and has no one to assist her. Patient requesting to go to SNF. CSW explained insurance and if insurance approves that we could send her to SNF. Patient is in agreement. Patient is eligible for the 3 day waiver with [employer]/Medicare. Per [facility] patient can go to SNF today. Patient chose [facility] [place] and will discharge today.   Employment status:  Retired Forensic scientist:  Medicare PT Recommendations:  Home with [place] / Referral to community resources:  [place]  Patient/Family's Response to care:  Patient thanked CSW for assistance   Patient/Family's Understanding of and Emotional Response to Diagnosis, Current Treatment, and  Prognosis:  Patient in agreement with DC plan   Emotional Assessment Appearance:  Appears stated age Attitude/Demeanor/Rapport:    Affect (typically observed):  Accepting, Hopeful, Pleasant Orientation:  Oriented to Self, Oriented to Place, Oriented to  Time, Oriented to Situation Alcohol / Substance use:  Not Applicable Psych involvement (Current and /or in the community):  No (Comment)  Discharge Needs  Concerns to be addressed:  Discharge Planning Concerns Readmission within the last 30 days:  No Current discharge risk:  None Barriers to Discharge:  Continued Medical Work up   [employer], [place] 03/22/2018, 2:51 PM

## 2018-03-22 NOTE — Progress Notes (Signed)
Physical Therapy Treatment Patient Details [name] MRN: [id number] DOB: 05-25-1940 Today's Date: 03/22/2018    History of Present Illness 77 y/o female here after bout of dizziness and fell down the 3 steps to her den, she reports pain in low back, and generally in R LE that she relates to the fall.  She reports history of other bouts of dizziness, they usually can last 30 minutes or more.     PT Comments    Patient seen for follow up therapy session per request of patient, family and MD; requesting consideration for STR at discharge.  Patient continues to perform all functional mobility with RW at cga/close sup level of assist (intermittent cuing for mechanics, overall safety), but no overt buckling or LOB.  Does endorse general lack of confidence with mobility (related to R LE pain/soreness from fall), but should do well with continued use of RW and follow up services (HHPT, [place], HHRN, HHaide, CSW) in her normal, daily and meaningful environment.  Will continue to follow and update recommendations as appropriate.    Follow Up Recommendations  Home health PT(HHOT, HHRN, HHaide, CSW)     Equipment Recommendations  Rolling walker with 5" wheels    Recommendations for Other Services       Precautions / Restrictions Precautions Precautions: Fall Restrictions Weight Bearing Restrictions: No    Mobility  Bed Mobility Overal bed mobility: Modified Independent                Transfers Overall transfer level: Needs assistance Equipment used: Rolling walker (2 wheeled) Transfers: Sit to/from Stand Sit to Stand: Min guard         General transfer comment: cuing for hand/foot placement, forward trunk lean and lift off  Ambulation/Gait Ambulation/Gait assistance: Min guard Gait Distance (Feet): 120 Feet Assistive device: Rolling walker (2 wheeled)       General Gait Details: reciprocal stepping pattern with exaggerated stance time R LE; attempted  education about 3-point, gait pattern leading with R LE (to offset WBing), but difficulty comprending and applying.  Very decreased cadence, gait speed.  Mild giving of R knee in loading phase, but improves with cuing for R TKE/quad set.   Stairs             Wheelchair Mobility    Modified Rankin (Stroke Patients Only)       Balance Overall balance assessment: Needs assistance Sitting-balance support: No upper extremity supported;Feet supported Sitting balance-Leahy Scale: Good     Standing balance support: Bilateral upper extremity supported Standing balance-Leahy Scale: Fair                              Cognition   Behavior During Therapy: WFL for tasks assessed/performed Overall Cognitive Status: Within Functional Limits for tasks assessed                                        Exercises Other Exercises Other Exercises: Sit/stand x7 with RW, cga progressing to close sup-emphasis on mechanics, hand/foot placement and overall safety with tranfser.  Improves to close sup with training/repetition. Other Exercises: Orthostatic assessment negative; see vitals flowsheet for details.  Denies symptoms of dizziness/lightheadedness with position change or throughout session.    General Comments        Pertinent Vitals/Pain Pain Assessment: Faces Faces Pain Scale: Hurts little  more Pain Location: R LE Pain Descriptors / Indicators: Aching;Grimacing;Guarding Pain Intervention(s): Limited activity within patient's tolerance;Monitored during session;Repositioned    Home Living                      Prior Function            PT Goals (current goals can now be found in the care plan section) Acute Rehab PT Goals Patient Stated Goal: go home, but not too early PT Goal Formulation: With patient Time For Goal Achievement: 04/03/18 Potential to Achieve Goals: Good Progress towards PT goals: Progressing toward goals    Frequency     Min 2X/week      PT Plan Current plan remains appropriate    Co-evaluation              AM-PAC PT "6 Clicks" Daily Activity  Outcome Measure  Difficulty turning over in bed (including adjusting bedclothes, sheets and blankets)?: None Difficulty moving from lying on back to sitting on the side of the bed? : None Difficulty sitting down on and standing up from a chair with arms (e.g., wheelchair, bedside commode, etc,.)?: Unable Help needed moving to and from a bed to chair (including a wheelchair)?: A Little Help needed walking in hospital room?: A Little Help needed climbing 3-5 steps with a railing? : A Little 6 Click Score: 18    End of Session Equipment Utilized During Treatment: Gait belt Activity Tolerance: Patient tolerated treatment well;Patient limited by fatigue Patient left: in chair;with call bell/phone within reach;with chair alarm set Nurse Communication: Mobility status PT Visit Diagnosis: Muscle weakness (generalized) (M62.81);Difficulty in walking, not elsewhere classified (R26.2)     Time: 9417-4081 PT Time Calculation (min) (ACUTE ONLY): 23 min  Charges:  $Gait Training: 8-22 mins $Therapeutic Activity: 8-22 mins                     Jaquasia Doscher H. Owens Shark, PT, DPT, NCS 03/22/18, 11:15 AM [phone]

## 2018-03-23 ENCOUNTER — Encounter: Payer: Self-pay | Admitting: Physician Assistant

## 2018-03-23 ENCOUNTER — Other Ambulatory Visit: Payer: Self-pay | Admitting: *Deleted

## 2018-03-23 DIAGNOSIS — R5381 Other malaise: Secondary | ICD-10-CM | POA: Diagnosis not present

## 2018-03-23 DIAGNOSIS — E1149 Type 2 diabetes mellitus with other diabetic neurological complication: Secondary | ICD-10-CM | POA: Diagnosis not present

## 2018-03-23 DIAGNOSIS — D860 Sarcoidosis of lung: Secondary | ICD-10-CM | POA: Diagnosis not present

## 2018-03-23 DIAGNOSIS — N390 Urinary tract infection, site not specified: Secondary | ICD-10-CM | POA: Diagnosis not present

## 2018-03-23 NOTE — Patient Outreach (Signed)
[facility] Care Management  03/23/2018  Pamela Collier [birth year] [id number]   Care Coordination   Patient with inpatient admission at [facility] 11/8-11/11, patient then discharged to [facility] on 11/11.   In basket message from [place] , that will follow patient at facility.   Plan  Will await communication from [facility] coordination regarding next  disposition plans      [name], RN, [place] Management Coordinator  [phone]- Mobile [phone]- [place]

## 2018-03-25 ENCOUNTER — Ambulatory Visit: Payer: Self-pay | Admitting: *Deleted

## 2018-03-25 NOTE — Progress Notes (Signed)
* [facility]     Assessment and Plan:  The patient is a 77 year old female with dyspnea, and weakness with debility, deconditioning, sleep apnea, and sarcoidosis.   Obstructive sleep apnea. --Currently doing well with auto-cpap machine with settings 5-15 cm H2O. - She is currently in a rehab facility, I have advised her to use her CPAP machine there as well.  Pulmonary sarcoidosis with lung nodules. --Patient has remote history of sarcoidosis, mainly manifest as chylous pleural effusion, as well as lung nodules.  Most recent CT chest shows a 5 mm right-sided lung nodule.  We will repeat CT chest to follow-up, however the patient is already had 7 CT scans this year, therefore will defer this non-urgent scan to 3 months.  Is stable at that time can consider further surveillance with chest x-ray. - Patient has mild hypercalcemia with acute on chronic kidney injury.  Uncertain if her hypercalcemia is due to worsening sarcoidosis versus renal failure, will continue to monitor.  We will check an ACE level today.  Repeat ACE and metabolic panel in 3 months.  Her sarcoidosis is typically manifested as increasing chylous pleural effusions, therefore as her current pleural effusion appears improved I am less concerned about recurrent or worsening sarcoidosis. --PPSV 23 02/26/12; PCV13, 07/01/13.   Chylous pleural effusion. - This appears resolved on most recent CT chest, will continue to monitor.  Elevated right diaphragm. -The patient has decreased air entry in the right lung base, review of her chest x-ray shows an elevated right diaphragm, possibly with a diaphragmatic paralysis. This may be related to her previous history of chylous pleural effusion. 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  Chronic debility and deconditioning. --I suspect that this is the greatest contributor to her exertional dyspnea.  --Continue to increase activity as  tolerated.   Chronic bronchitis. --Continue using symbicort bid.    Orders Placed This Encounter  Procedures  . CT CHEST WO CONTRAST  . Angiotensin converting enzyme  . Angiotensin converting enzyme  . Comp Met (CMET)     Return in about 3 months (around 06/26/2018).    Date: 03/25/2018  MRN# [id number] Pamela Collier [birth year]   [name] is a 77 y.o. old female seen in follow up for chief complaint of  Chief Complaint  Patient presents with  . Follow-up    pt here for f/u: she had a fall last Friday and is in nursing home.  . Sleep Apnea    She has been offf cpap for several nights after being admitted to nursing home for rehab.  . Sarcoidosis    pt says she sl. cough, she does not think she is sob.    Subjective: The patient has history of pulmonary sarcoidosis diagnosed more than 20 years ago by a mediastinal biopsy. 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 She has a history of sleep apnea, and has been maintained on CPAP.  At her recent visit with her primary care physician it was noted that she had acute kidney injury as well as hypercalcemia, she was therefore referred back for possible recurrence of sarcoidosis.  Since her last visit she had a fall and she is now in rehab. She feels that her breathing has been doing ok. Review of her most recent CT shows resolution of previously seen pleural effusion. She remains on dulera 2 puffs twice daily and feels that it helps with her  breathing.    At her last visit it was noted that she had significant deconditioning and she was encouraged to continue PT. It was also noted that she was not using PAP very regularly. She was asked to continue symbicort bid.  Since her last visit her She is using Symbicort 2 puffs bid, she is waiting for her new CPAP machine.   **CT AP 03/19/2018>> images personally reviewed, in comparison with previous scan on 06/03/2017 and  05/23/2017 the previously seen right pleural effusion has resolved **CPAP download 02/24/2018-03/25/2018>> usage greater than 4 hours is 22/30 days.  Average usage on days used is 5 hours 49 minutes.  Pressure ranges 5-15.  Leaks are within normal limits.  Median pressure 9, 95th percentile pressure 12, maximum pressure 13.  Residual AHI is 2.  Overall this shows adequate compliance with CPAP with excellent control of obstructive sleep apnea. **Chest x-ray 03/19/2018>> images personally reviewed.  Scattered bibasilar interstitial changes, bibasilar atelectasis, elevated right diaphragm.  Review of CT AP images from the same day are mild scattered interstitial changes in both bases. **CT chest 06/03/2017>> images personally reviewed, scattered bibasilar interstitial changes, with mild right basal pleural effusion.  Mild right hilar prominence, which may be an intrapulmonary lymph node. **chest x-ray 09/12/15, bibasilar atelectasis, minimal pleural effusions, scattered interstitial scarring.  10/2012 CT chest >> small to moderate R lung effusion>> chylothorax, 100% lymphs 10/2013 thoracentesis> cytology negative, WBC 1471, > 90% "small lymphs" 01/2014 CT chest> near complete resolution of pleural effusion, stable lymphadenopathy 08/2014 CT chest [place] ([facility]): small right sided effusion decreased in size, stable mediastinal lymphadenopathy 1.0cm largest, 7m **Desat walk 09/17/16; at rest on RA her sat was 98% and HR 74. After 180 feet sat was 96% and HR 84, slow pace, minimal dyspnea.  After 360 feet; sat was 95% and HR 92; moderate dyspnea.   Results for BKYUNG, MUTO(MRN [id number] as of 03/25/2018 14:02  Ref. Range 03/22/2018 09:16  Calcium Latest Ref Range: 8.9 - 10.3 mg/dL 10.5 (H)    Medication:   Outpatient Encounter Medications as of 03/26/2018  Medication Sig  . acetaminophen (TYLENOL) 500 MG tablet Take 1,000 mg by mouth every 6 (six) hours as needed for mild  pain or headache.   . allopurinol (ZYLOPRIM) 100 MG tablet Take 1 tablet (100 mg total) by mouth daily.  .Marland KitchenALPRAZolam (XANAX) 0.25 MG tablet Take 1 tablet (0.25 mg total) by mouth daily.  .Marland KitchenamLODipine (NORVASC) 5 MG tablet Take 1 tablet (5 mg total) by mouth daily.  .Marland Kitchenaspirin EC 81 MG tablet Take 81 mg by mouth daily.  . B-D UF III MINI PEN NEEDLES 31G X 5 MM MISC USE THREE TIMES A DAY  . blood glucose meter kit and supplies Dispense based on patient and insurance preference. Use up to three times daily as directed. (FOR ICD-10 E11.21)  . BRILINTA 90 MG TABS tablet Take 1 tablet (90 mg total) by mouth 2 (two) times daily.  . carbamide peroxide (DEBROX) 6.5 % OTIC solution Place 5 drops into the right ear 2 (two) times daily.  .Marland Kitchendicyclomine (BENTYL) 10 MG capsule Take 1 capsule (10 mg total) by mouth 4 (four) times daily -  before meals and at bedtime.  . diphenoxylate-atropine (LOMOTIL) 2.5-0.025 MG tablet Take 1 tablet by mouth 4 (four) times daily as needed for diarrhea or loose stools.  . donepezil (ARICEPT) 10 MG tablet Take 10 mg by mouth at bedtime.  .[name]  feeding supplement, GLUCERNA SHAKE, (GLUCERNA SHAKE) LIQD Take 237 mLs by mouth 2 (two) times daily between meals.  . fluticasone (FLONASE) 50 MCG/ACT nasal spray Place 2 sprays into both nostrils daily as needed for rhinitis.  [name] glucose blood (ACCU-CHEK AVIVA PLUS) test strip USE TO CHECK GLUCOSE THREE TIMES DAILY  . glucose blood test strip Use to check blood sugars 2 times daily  . insulin aspart protamine- aspart (NOVOLOG MIX 70/30) (70-30) 100 UNIT/ML injection Inject 0.3 mLs (30 Units total) into the skin 2 (two) times daily with a meal.  . isosorbide mononitrate (IMDUR) 30 MG 24 hr tablet Take 1 tablet (30 mg total) by mouth daily.  [name] levofloxacin (LEVAQUIN) 250 MG tablet Take 1 tablet (250 mg total) by mouth daily.  . mometasone-formoterol (DULERA) 200-5 MCG/ACT AERO Inhale 2 puffs into the lungs 2 (two) times daily.  . nitroGLYCERIN  (NITROLINGUAL) 0.4 MG/SPRAY spray Place 1 spray under the tongue every 5 (five) minutes x 3 doses as needed for chest pain.  [name] oxyCODONE (OXY IR/ROXICODONE) 5 MG immediate release tablet Take 1 tablet (5 mg total) by mouth every 6 (six) hours as needed for moderate pain.  . pregabalin (LYRICA) 100 MG capsule Take 1 capsule (100 mg total) by mouth 2 (two) times daily.   Facility-Administered Encounter Medications as of 03/26/2018  Medication  . albuterol (PROVENTIL) (5 MG/ML) 0.5% nebulizer solution 2.5 mg     Allergies:  Cephalexin; Contrast media [iodinated diagnostic agents]; Doxycycline; Gabapentin; and Sulfa antibiotics  Review of Systems:  Constitutional: Feels well. Cardiovascular: Denies chest pain, exertional chest pain.  Pulmonary: Denies hemoptysis, pleuritic chest pain.   The remainder of systems were reviewed and were found to be negative other than what is documented in the HPI.    Physical Examination:   VS: BP 128/70 (BP Location: Left Arm, Cuff Size: Normal)   Pulse 77   Resp 16   Ht '5\' 7"'$  (1.702 m)   Wt 184 lb (83.5 kg)   SpO2 99%   BMI 28.82 kg/m   General Appearance: No distress  Neuro:without focal findings, mental status, speech normal, alert and oriented HEENT: PERRLA, EOM intact Pulmonary: No wheezing, No rales  CardiovascularNormal S1,S2.  No m/r/g.  Abdomen: Benign, Soft, non-tender, No masses Renal:  No costovertebral tenderness  GU:  No performed at this time. Endoc: No evident thyromegaly, no signs of acromegaly or Cushing features Skin:   warm, no rashes, no ecchymosis  Extremities: normal, no cyanosis, clubbing.     LABORATORY PANEL:   CBC Recent Labs  Lab 03/19/18 1248  WBC 6.9  HGB 9.8*  HCT 31.6*  PLT 257   ------------------------------------------------------------------------------------------------------------------  Chemistries  Recent Labs  Lab 03/19/18 1248  03/22/18 0916  NA 141   < > 136  K 4.4   < > 4.6  CL 109    < > 101  CO2 24   < > 25  GLUCOSE 131*   < > 190*  BUN 29*   < > 24*  CREATININE 2.18*   < > 1.55*  CALCIUM 9.9   < > 10.5*  AST 22  --   --   ALT 14  --   --   ALKPHOS 44  --   --   BILITOT 0.9  --   --    < > = values in this interval not displayed.   ------------------------------------------------------------------------------------------------------------------  Cardiac Enzymes Recent Labs  Lab 03/19/18 1941  TROPONINI <0.03   ------------------------------------------------------------  RADIOLOGY:   No results found for this or any previous visit. Results for orders placed during the hospital encounter of 09/12/15  DG Chest 2 View   Narrative CLINICAL DATA:  Shortness of breath and chest pain. History of sarcoidosis  EXAM: CHEST  2 VIEW  COMPARISON:  Chest radiograph June 28, 2015 and chest CT July 11, 2015  FINDINGS: There is no edema or consolidation. Heart size and pulmonary vascularity are normal. No adenopathy. Calcified left mediastinal lymph nodes likely is secondary to sarcoidosis. No bone lesions. No pneumothorax.  IMPRESSION: Calcified lymph nodes, a finding likely secondary to prior sarcoidosis. No edema or consolidation.   Electronically Signed   By: Lowella Grip III M.D.   On: 09/12/2015 12:46    ------------------------------------------------------------------------------------------------------------------  Thank  you for allowing [facility] to assist in the care of your patient. Our recommendations are noted above.  Please contact us if we can be of further service.  Marda Stalker, M.D., F.C.C.P.  Board Certified in Internal Medicine, Pulmonary Medicine, [place], and Sleep Medicine.  [facility] Office Number: [phone]  03/25/2018

## 2018-03-26 ENCOUNTER — Encounter: Payer: Self-pay | Admitting: Internal Medicine

## 2018-03-26 ENCOUNTER — Ambulatory Visit (INDEPENDENT_AMBULATORY_CARE_PROVIDER_SITE_OTHER): Payer: Medicare Other | Admitting: Internal Medicine

## 2018-03-26 VITALS — BP 128/70 | HR 77 | Resp 16 | Ht 67.0 in | Wt 184.0 lb

## 2018-03-26 DIAGNOSIS — I255 Ischemic cardiomyopathy: Secondary | ICD-10-CM | POA: Diagnosis not present

## 2018-03-26 DIAGNOSIS — R918 Other nonspecific abnormal finding of lung field: Secondary | ICD-10-CM

## 2018-03-26 DIAGNOSIS — D860 Sarcoidosis of lung: Secondary | ICD-10-CM

## 2018-03-26 DIAGNOSIS — J90 Pleural effusion, not elsewhere classified: Secondary | ICD-10-CM

## 2018-03-26 DIAGNOSIS — D869 Sarcoidosis, unspecified: Secondary | ICD-10-CM

## 2018-03-26 DIAGNOSIS — G4733 Obstructive sleep apnea (adult) (pediatric): Secondary | ICD-10-CM | POA: Diagnosis not present

## 2018-03-26 NOTE — Patient Instructions (Addendum)
Will check blood work today and again in 3 months.  Will check CT chest in 3 months and follow up after that.   Use CPAP every night.

## 2018-03-31 ENCOUNTER — Ambulatory Visit: Payer: Medicare Other | Admitting: *Deleted

## 2018-04-06 DIAGNOSIS — R55 Syncope and collapse: Secondary | ICD-10-CM | POA: Diagnosis not present

## 2018-04-06 DIAGNOSIS — R198 Other specified symptoms and signs involving the digestive system and abdomen: Secondary | ICD-10-CM | POA: Diagnosis not present

## 2018-04-06 DIAGNOSIS — N183 Chronic kidney disease, stage 3 (moderate): Secondary | ICD-10-CM | POA: Diagnosis not present

## 2018-04-06 DIAGNOSIS — N390 Urinary tract infection, site not specified: Secondary | ICD-10-CM | POA: Diagnosis not present

## 2018-04-13 ENCOUNTER — Other Ambulatory Visit: Payer: Self-pay | Admitting: *Deleted

## 2018-04-13 DIAGNOSIS — N390 Urinary tract infection, site not specified: Secondary | ICD-10-CM | POA: Diagnosis not present

## 2018-04-13 DIAGNOSIS — D631 Anemia in chronic kidney disease: Secondary | ICD-10-CM | POA: Diagnosis not present

## 2018-04-13 DIAGNOSIS — E1122 Type 2 diabetes mellitus with diabetic chronic kidney disease: Secondary | ICD-10-CM | POA: Diagnosis not present

## 2018-04-13 DIAGNOSIS — I5032 Chronic diastolic (congestive) heart failure: Secondary | ICD-10-CM | POA: Diagnosis not present

## 2018-04-13 DIAGNOSIS — I130 Hypertensive heart and chronic kidney disease with heart failure and stage 1 through stage 4 chronic kidney disease, or unspecified chronic kidney disease: Secondary | ICD-10-CM | POA: Diagnosis not present

## 2018-04-13 DIAGNOSIS — N184 Chronic kidney disease, stage 4 (severe): Secondary | ICD-10-CM | POA: Diagnosis not present

## 2018-04-13 NOTE — Patient Outreach (Signed)
[facility]) Care Management  04/13/2018  Pamela Collier [birth year] [id number]   Transition of care   12/2  Received message patient message discharged from [facility]  SNF over the weekend  Patient admission to [facility] 11/8-11/11 Dx: Near Syncope , fall  PMHX significant for Diabetes, Chronic Diastolic Heart failure, COPD, Pulmonary Sacroidosis, Sleep Apnea , CVA.Mild Dementia ,   Unsuccessful outreach call to patient , phone just rang numerous times, no answer , no voice mail to leave a message.   Plan  Will plant return call in the next 4 business days .   [name], RN, [place] Management Coordinator  [phone]- Mobile [phone]- Toll Free Main Office

## 2018-04-14 DIAGNOSIS — D631 Anemia in chronic kidney disease: Secondary | ICD-10-CM | POA: Diagnosis not present

## 2018-04-14 DIAGNOSIS — I5032 Chronic diastolic (congestive) heart failure: Secondary | ICD-10-CM | POA: Diagnosis not present

## 2018-04-14 DIAGNOSIS — E1122 Type 2 diabetes mellitus with diabetic chronic kidney disease: Secondary | ICD-10-CM | POA: Diagnosis not present

## 2018-04-14 DIAGNOSIS — N184 Chronic kidney disease, stage 4 (severe): Secondary | ICD-10-CM | POA: Diagnosis not present

## 2018-04-14 DIAGNOSIS — I130 Hypertensive heart and chronic kidney disease with heart failure and stage 1 through stage 4 chronic kidney disease, or unspecified chronic kidney disease: Secondary | ICD-10-CM | POA: Diagnosis not present

## 2018-04-14 DIAGNOSIS — N390 Urinary tract infection, site not specified: Secondary | ICD-10-CM | POA: Diagnosis not present

## 2018-04-15 ENCOUNTER — Telehealth: Payer: Self-pay | Admitting: Internal Medicine

## 2018-04-15 ENCOUNTER — Other Ambulatory Visit: Payer: Self-pay | Admitting: *Deleted

## 2018-04-15 DIAGNOSIS — E1122 Type 2 diabetes mellitus with diabetic chronic kidney disease: Secondary | ICD-10-CM | POA: Diagnosis not present

## 2018-04-15 DIAGNOSIS — I5032 Chronic diastolic (congestive) heart failure: Secondary | ICD-10-CM | POA: Diagnosis not present

## 2018-04-15 DIAGNOSIS — I130 Hypertensive heart and chronic kidney disease with heart failure and stage 1 through stage 4 chronic kidney disease, or unspecified chronic kidney disease: Secondary | ICD-10-CM | POA: Diagnosis not present

## 2018-04-15 DIAGNOSIS — D631 Anemia in chronic kidney disease: Secondary | ICD-10-CM | POA: Diagnosis not present

## 2018-04-15 DIAGNOSIS — N390 Urinary tract infection, site not specified: Secondary | ICD-10-CM | POA: Diagnosis not present

## 2018-04-15 DIAGNOSIS — N184 Chronic kidney disease, stage 4 (severe): Secondary | ICD-10-CM | POA: Diagnosis not present

## 2018-04-15 NOTE — Telephone Encounter (Signed)
Copied from [place] [id number]. Topic: Quick Communication - Home Health Verbal Orders >> Apr 15, 2018  4:01 PM Berneta Levins wrote: Caller/Agency: Lattie Haw with [facility] Number: [phone], [place] to leave a message Requesting OT/PT/Skilled Nursing/Social Work: OT Frequency: 1x a week for 1 week, then 2x a week for 5 weeks.

## 2018-04-15 NOTE — Telephone Encounter (Signed)
Verbal orders given  

## 2018-04-15 NOTE — Patient Outreach (Signed)
[facility]) Care Management  [place]  04/15/2018   Pamela Collier 1941-04-15 [id number]   Transition of care   12/2  Received message patient message discharged from [facility] over the weekend  Patient admission to [facility] 11/8-11/11 Dx: Near Syncope , fall  PMHX significant for Diabetes, Chronic Diastolic Heart failure, COPD, Pulmonary Sacroidosis, Sleep Apnea , CVA.Mild Dementia ,   Patient followed by [facility] care coordinator prior to admission .  Subjective:   Patient discussed recent hospital admission and rehab stay. Patient discussed her legs still being weak and having pain in her legs.  Patient discussed home health RN has visited , physical therapy on yesterday but was not able to do much do to her leg pain . Patient discussed that she was receiving medicine for the pain while in rehab, unable recall name of medication at this time.  She reports another therapist she believes is coming on today.  Patient voiced looking forward to seeing PCP on tomorrow to discuss problems with her legs, and states that she knows that she has neuropathy that could be causing some pain.  Dizziness  Patient reports having dizziness when she gets up , " I think they should have scanned my head when I fell. Discussed with patient that she did have a CT scan while hospitalized. Encouraged to discuss symptoms with Dr.Tullo.  Fall Risk Patient reports using walker at home, and looking forward to working therapy to get stronger, she discussed benefits of the daily therapy while in rehab. Reviewed fall precautions, using walker, standing a few seconds prior to starting to walk  Diabetes  Patient discussed checking her blood sugar twice daily , today's reading was 126, patient denies having low blood sugar of 70 or below, reports the lowest reading this week has been 82. Patient reports tolerating diet at home. Reviewed low blood sugar symptoms and how to treat with patient,  she acknowledged that she is able to recognize symptoms and how to treat with a quick sugar snack.   Social  Patient lives at home with her spouse, that is able to assist at times, he does provide transportation .  Patient receives meals on wheels.  She is followed by Home health services, by [facility].    Medications Patient now has blister packaged medication and reports that helps her with staying organized . She is not able to review medications over the phone at this time.     Appointments  Patient has PCP on 12/6 Patient has cardiology visit on 12/12.             Encounter Medications:  Outpatient Encounter Medications as of 04/15/2018  Medication Sig Note  . acetaminophen (TYLENOL) 500 MG tablet Take 1,000 mg by mouth every 6 (six) hours as needed for mild pain or headache.    . allopurinol (ZYLOPRIM) 100 MG tablet Take 1 tablet (100 mg total) by mouth daily.   [name] ALPRAZolam (XANAX) 0.25 MG tablet Take 1 tablet (0.25 mg total) by mouth daily.   [name] amLODipine (NORVASC) 5 MG tablet Take 1 tablet (5 mg total) by mouth daily.   [name] aspirin EC 81 MG tablet Take 81 mg by mouth daily.   . B-D UF III MINI PEN NEEDLES 31G X 5 MM MISC USE THREE TIMES A DAY   . blood glucose meter kit and supplies Dispense based on patient and insurance preference. Use up to three times daily as directed. 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  BRILINTA 90 MG TABS tablet Take 1 tablet (90 mg total) by mouth 2 (two) times daily.   . carbamide peroxide (DEBROX) 6.5 % OTIC solution Place 5 drops into the right ear 2 (two) times daily.   [name] dicyclomine (BENTYL) 10 MG capsule Take 1 capsule (10 mg total) by mouth 4 (four) times daily -  before meals and at bedtime.   . diphenoxylate-atropine (LOMOTIL) 2.5-0.025 MG tablet Take 1 tablet by mouth 4 (four) times daily as needed for diarrhea or loose stools.   . donepezil (ARICEPT) 10 MG tablet Take 10 mg by mouth at bedtime.   . feeding supplement, GLUCERNA SHAKE, (GLUCERNA SHAKE) LIQD  Take 237 mLs by mouth 2 (two) times daily between meals. 03/03/2018: Takes once a day due to cost  . fluticasone (FLONASE) 50 MCG/ACT nasal spray Place 2 sprays into both nostrils daily as needed for rhinitis.   [name] glucose blood (ACCU-CHEK AVIVA PLUS) test strip USE TO CHECK GLUCOSE THREE TIMES DAILY   . glucose blood test strip Use to check blood sugars 2 times daily   . insulin aspart protamine- aspart (NOVOLOG MIX 70/30) (70-30) 100 UNIT/ML injection Inject 0.3 mLs (30 Units total) into the skin 2 (two) times daily with a meal.   . isosorbide mononitrate (IMDUR) 30 MG 24 hr tablet Take 1 tablet (30 mg total) by mouth daily.   [name] levofloxacin (LEVAQUIN) 250 MG tablet Take 1 tablet (250 mg total) by mouth daily.   . mometasone-formoterol (DULERA) 200-5 MCG/ACT AERO Inhale 2 puffs into the lungs 2 (two) times daily.   . nitroGLYCERIN (NITROLINGUAL) 0.4 MG/SPRAY spray Place 1 spray under the tongue every 5 (five) minutes x 3 doses as needed for chest pain.   [name] oxyCODONE (OXY IR/ROXICODONE) 5 MG immediate release tablet Take 1 tablet (5 mg total) by mouth every 6 (six) hours as needed for moderate pain.   . pregabalin (LYRICA) 100 MG capsule Take 1 capsule (100 mg total) by mouth 2 (two) times daily.    Facility-Administered Encounter Medications as of 04/15/2018  Medication  . albuterol (PROVENTIL) (5 MG/ML) 0.5% nebulizer solution 2.5 mg       Fall/Depression Screening: Fall Risk  02/25/2018 08/24/2017 11/10/2016  Falls in the past year? Yes Yes No  Comment - - -  Number falls in past yr: 1 1 -  Injury with Fall? No - -  Risk Factor Category  - - -  Risk for fall due to : History of fall(s);Impaired balance/gait History of fall(s) -  Risk for fall due to: Comment - - -  Follow up Falls prevention discussed Falls prevention discussed -   PHQ 2/9 Scores 08/24/2017 11/10/2016 07/01/2016 02/12/2016 12/03/2015 10/10/2015 10/30/2014  PHQ - 2 Score 3 0 0 3 0 1 0  PHQ- 9 Score 6 - - 7 - - -  Exception  Documentation - - - - - - -    Plan:  Will plan home visit in the next week.  Will send PCP involvement barrier letter .    [facility] CM Care Plan Problem One     Most Recent Value  Care Plan Problem One  Risk for readmission ,recent hospital and rehab stay related to fall and syncope   Role Documenting the Problem One  Care Management [place] for Problem One  Active  [facility] Long Term Goal   Patient will not experience a hospital admission over the next 31 days   [facility] Long Term Goal Start  Date  04/15/18  Interventions for Problem One Long Term Goal  Discussed current clinical state, discussed having medications as prescribed at discharge,, encouraged to take prescribed.   [facility] CM Short Term Goal #1   Patient will be able to report attending all medical appointments over the next 30 days   [facility] CM Short Term Goal #1 Start Date  04/15/18  Interventions for Short Term Goal #1  Reivewed PCP appointment date and time, and verifed transportation, encouraged to take medicaitons to visits.   [facility] CM Short Term Goal #2   Patient will be able to reports no falls over the next 30 days   [facility] CM Short Term Goal #2 Start Date  04/15/18  Interventions for Short Term Goal #2  Advised regarding fall precautions, using walker, participation in therapy to improve balance.,keeping frequent used items nearby.   [facility] CM Short Term Goal #3  Patient will be able to verbalize improvement in leg  pain over the next 30 days as evidenced by report.   [facility] CM Short Term Goal #3 Start Date  04/15/18  Interventions for Short Tern Goal #3  Advised patient to discuss with PCP at visit about pain in legs for other recommendations, discussed relieving measures , rest , elevation .     [facility] CM Care Plan Problem Two     Most Recent Value  Care Plan Problem Two  Diabetes self care management knowledge deficit   Role Documenting the Problem Two  Care Management Coordinator  Care Plan for Problem Two  Active  Interventions  for Problem Two Long Term Goal   Advised patient regarding the importance of taking medications as prescribed, notifyig MD of low blood sugar episodes, reviewed low and high blood sugar readings   [facility] Long Term Goal  Patient  will be able to verbalize at least 2 measures to managing diabetes control over the next 60 days  [facility] CM Short Term Goal #1   Patient will be able to report monitoring blood sugars at least 2 twice daily over the next 30 days and keeping a record.   [facility] CM Short Term Goal #1 Start Date  04/15/18  Interventions for Short Term Goal #2   Advised benefit of keeping track of blood sugar and keeping a record , to help managing diabetes and plan of care encouraged to take record to PCP visit .       [name], RN, [place] Management Coordinator  [phone]- Mobile [phone]- Toll Free Main Office

## 2018-04-16 ENCOUNTER — Ambulatory Visit (INDEPENDENT_AMBULATORY_CARE_PROVIDER_SITE_OTHER): Payer: Medicare Other | Admitting: Internal Medicine

## 2018-04-16 ENCOUNTER — Encounter: Payer: Self-pay | Admitting: Internal Medicine

## 2018-04-16 VITALS — BP 142/52 | HR 75 | Temp 97.9°F | Resp 16 | Ht 67.0 in | Wt 187.8 lb

## 2018-04-16 DIAGNOSIS — M79606 Pain in leg, unspecified: Secondary | ICD-10-CM

## 2018-04-16 DIAGNOSIS — Z09 Encounter for follow-up examination after completed treatment for conditions other than malignant neoplasm: Secondary | ICD-10-CM

## 2018-04-16 DIAGNOSIS — K591 Functional diarrhea: Secondary | ICD-10-CM | POA: Diagnosis not present

## 2018-04-16 DIAGNOSIS — I6523 Occlusion and stenosis of bilateral carotid arteries: Secondary | ICD-10-CM

## 2018-04-16 DIAGNOSIS — I10 Essential (primary) hypertension: Secondary | ICD-10-CM | POA: Diagnosis not present

## 2018-04-16 MED ORDER — GABAPENTIN 100 MG PO CAPS: 100.0000 mg | ORAL_CAPSULE | Freq: Three times a day (TID) | ORAL | 2 refills | Status: DC

## 2018-04-16 MED ORDER — AMLODIPINE BESYLATE 5 MG PO TABS: 5.0000 mg | ORAL_TABLET | Freq: Every day | ORAL | 1 refills | Status: DC

## 2018-04-16 MED ORDER — ROSUVASTATIN CALCIUM 20 MG PO TABS: 20.0000 mg | ORAL_TABLET | Freq: Every day | ORAL | 2 refills | Status: DC

## 2018-04-16 NOTE — Progress Notes (Signed)
Subjective:  Patient ID: Pamela Collier, female    DOB: 06-Mar-1941  Age: 77 y.o. MRN: [id number]  CC: The primary encounter diagnosis was Carotid artery calcification, bilateral. Diagnoses of Functional diarrhea, Pain of lower extremity, unspecified laterality, Essential hypertension, Atherosclerosis of both carotid arteries, and Hospital discharge follow-up were also pertinent to this visit.  HPI Pamela Collier presents for hospital follow up.  She had 2 ER visits in November ,  First on Nov 4 for chest pain. She rule dout and was sent home.  She returned on  Nov 8 for near syncope,  Was admitted due to  History of fall at home accompanied by dizziness, aggravated by chronic diarrhea and UTI  .  Admitting diagnoses: concussion, dehdyration,  Chronic diarrhea.  Workup reviewed with patient:  CT head,  Cervical spine  Were done and negative for bleeds, infarcts,  And fractures.  No acute changes.  Dense carotid calcifications were noted. She has not had carotid ultrasounds    Stools studies were negative for c dificile .  CT was negative for diverticulitis/colitis. and prn lomotil was prescribed.  Aricept was not suspended.  AKI : she was given  IV fluids with Cr improvement  From 2.1 to 1.55  UTI: treated with levaquin   Klebsiella by culture DM: insulin dose was reduced to 30 units of 70/30 bid  HTN: amlodipine 5 mg added and toprol stopped due to sinus pause.    Medication changes:  Toprol ARB, and crestor held,  And she was disharged to rehab on Nov 11 for PT bc family concerned about her safety at home.   Came home on Sunday    Orthostatics checked today.  Pulse did not change (off toprol) but systolic dropped 409  To 811 .  she did not feel dizzy   Lab Results  Component Value Date   HGBA1C 7.7 (H) 02/10/2018     Outpatient Medications Prior to Visit  Medication Sig Dispense Refill  . allopurinol (ZYLOPRIM) 100 MG tablet Take 1 tablet (100 mg total) by mouth daily. 30 tablet 0   . aspirin EC 81 MG tablet Take 81 mg by mouth daily.    . B-D UF III MINI PEN NEEDLES 31G X 5 MM MISC USE THREE TIMES A DAY 270 each 4  . blood glucose meter kit and supplies Dispense based on patient and insurance preference. Use up to three times daily as directed. (FOR ICD-10 E11.21) 1 each 0  . BRILINTA 90 MG TABS tablet Take 1 tablet (90 mg total) by mouth 2 (two) times daily. 180 tablet 2  . dicyclomine (BENTYL) 10 MG capsule Take 1 capsule (10 mg total) by mouth 4 (four) times daily -  before meals and at bedtime. 120 capsule 0  . feeding supplement, GLUCERNA SHAKE, (GLUCERNA SHAKE) LIQD Take 237 mLs by mouth 2 (two) times daily between meals. 60 Can 0  . glucose blood (ACCU-CHEK AVIVA PLUS) test strip USE TO CHECK GLUCOSE THREE TIMES DAILY 200 each 5  . glucose blood test strip Use to check blood sugars 2 times daily 100 each 12  . insulin aspart protamine- aspart (NOVOLOG MIX 70/30) (70-30) 100 UNIT/ML injection Inject 0.3 mLs (30 Units total) into the skin 2 (two) times daily with a meal. 10 mL 11  . isosorbide mononitrate (IMDUR) 30 MG 24 hr tablet Take 1 tablet (30 mg total) by mouth daily. 90 tablet 2  . amLODipine (NORVASC) 5 MG tablet Take 1 tablet (5  mg total) by mouth daily. 30 tablet 0  . donepezil (ARICEPT) 10 MG tablet Take 10 mg by mouth at bedtime.    . pregabalin (LYRICA) 100 MG capsule Take 1 capsule (100 mg total) by mouth 2 (two) times daily. 60 capsule 0  . acetaminophen (TYLENOL) 500 MG tablet Take 1,000 mg by mouth every 6 (six) hours as needed for mild pain or headache.     . ALPRAZolam (XANAX) 0.25 MG tablet Take 1 tablet (0.25 mg total) by mouth daily. (Patient not taking: Reported on 04/16/2018) 30 tablet 0  . carbamide peroxide (DEBROX) 6.5 % OTIC solution Place 5 drops into the right ear 2 (two) times daily. (Patient not taking: Reported on 04/16/2018) 15 mL 0  . diphenoxylate-atropine (LOMOTIL) 2.5-0.025 MG tablet Take 1 tablet by mouth 4 (four) times daily as  needed for diarrhea or loose stools. (Patient not taking: Reported on 04/16/2018) 10 tablet 0  . fluticasone (FLONASE) 50 MCG/ACT nasal spray Place 2 sprays into both nostrils daily as needed for rhinitis. (Patient not taking: Reported on 04/16/2018) 16 g 5  . HUMALOG MIX 75/25 KWIKPEN (75-25) 100 UNIT/ML Kwikpen     . levofloxacin (LEVAQUIN) 250 MG tablet Take 1 tablet (250 mg total) by mouth daily. (Patient not taking: Reported on 04/16/2018) 4 tablet 0  . mometasone-formoterol (DULERA) 200-5 MCG/ACT AERO Inhale 2 puffs into the lungs 2 (two) times daily. (Patient not taking: Reported on 04/16/2018) 13 g 3  . nitroGLYCERIN (NITROLINGUAL) 0.4 MG/SPRAY spray Place 1 spray under the tongue every 5 (five) minutes x 3 doses as needed for chest pain. (Patient not taking: Reported on 04/16/2018) 12 g 12  . oxyCODONE (OXY IR/ROXICODONE) 5 MG immediate release tablet Take 1 tablet (5 mg total) by mouth every 6 (six) hours as needed for moderate pain. (Patient not taking: Reported on 04/16/2018) 12 tablet 0   Facility-Administered Medications Prior to Visit  Medication Dose Route Frequency Provider Last Rate Last Dose  . albuterol (PROVENTIL) (5 MG/ML) 0.5% nebulizer solution 2.5 mg  2.5 mg Nebulization Once [name], MD        Review of Systems;  Patient denies headache, fevers, malaise, unintentional weight loss, skin rash, eye pain, sinus congestion and sinus pain, sore throat, dysphagia,  hemoptysis , cough, dyspnea, wheezing, chest pain, palpitations, orthopnea, edema, abdominal pain, nausea, melena, diarrhea, constipation, flank pain, dysuria, hematuria, urinary  Frequency, nocturia, numbness, tingling, seizures,  Focal weakness, Loss of consciousness,  Tremor, insomnia, depression, anxiety, and suicidal ideation.      Objective:  BP (!) 142/52 (BP Location: Left Arm, Patient Position: Sitting, Cuff Size: Normal)   Pulse 75   Temp 97.9 F (36.6 C) (Oral)   Resp 16   Ht 5' 7" (1.702 m)   Wt  187 lb 12.8 oz (85.2 kg)   SpO2 97%   BMI 29.41 kg/m   BP Readings from Last 3 Encounters:  04/16/18 (!) 142/52  03/26/18 128/70  03/22/18 (!) 151/49    Wt Readings from Last 3 Encounters:  04/16/18 187 lb 12.8 oz (85.2 kg)  03/26/18 184 lb (83.5 kg)  03/19/18 187 lb 12.8 oz (85.2 kg)    General appearance: alert, cooperative and appears stated age Ears: normal TM's and external ear canals both ears Throat: lips, mucosa, and tongue normal; teeth and gums normal Neck: no adenopathy, no carotid bruit, supple, symmetrical, trachea midline and thyroid not enlarged, symmetric, no tenderness/mass/nodules Back: symmetric, no curvature. ROM normal. No CVA tenderness. Lungs:  clear to auscultation bilaterally Heart: regular rate and rhythm, S1, S2 normal, no murmur, click, rub or gallop Abdomen: soft, non-tender; bowel sounds normal; no masses,  no organomegaly Pulses: 2+ and symmetric Skin: Skin color, texture, turgor normal. No rashes or lesions Lymph nodes: Cervical, supraclavicular, and axillary nodes normal.  Lab Results  Component Value Date   HGBA1C 7.7 (H) 02/10/2018   HGBA1C 7.7 (H) 09/25/2017   HGBA1C 7.5 (H) 06/08/2017    Lab Results  Component Value Date   CREATININE 1.55 (H) 03/22/2018   CREATININE 1.65 (H) 03/21/2018   CREATININE 2.18 (H) 03/19/2018    Lab Results  Component Value Date   WBC 6.9 03/19/2018   HGB 9.8 (L) 03/19/2018   HCT 31.6 (L) 03/19/2018   PLT 257 03/19/2018   GLUCOSE 190 (H) 03/22/2018   CHOL 124 11/15/2016   TRIG 175 (H) 11/15/2016   HDL 29 (L) 11/15/2016   LDLDIRECT 102.0 03/14/2016   LDLCALC 60 11/15/2016   ALT 14 03/19/2018   AST 22 03/19/2018   NA 136 03/22/2018   K 4.6 03/22/2018   CL 101 03/22/2018   CREATININE 1.55 (H) 03/22/2018   BUN 24 (H) 03/22/2018   CO2 25 03/22/2018   TSH 1.23 02/18/2018   INR 1.2 (H) 02/02/2015   HGBA1C 7.7 (H) 02/10/2018   MICROALBUR 28.9 (H) 09/26/2015    Ct Abdomen Pelvis Wo  Contrast  Result Date: 03/19/2018 CLINICAL DATA:  Diarrhea past several days with abdominal pain. History of IV contrast allergy. EXAM: CT ABDOMEN AND PELVIS WITHOUT CONTRAST TECHNIQUE: Multidetector CT imaging of the abdomen and pelvis was performed following the standard protocol without IV contrast. COMPARISON:  06/03/2017, 05/23/2017 and 10/19/2015 FINDINGS: Lower chest: No airspace process or significant effusion. Few tiny pulmonary nodules within the lung bases with the largest measuring 5 mm over the right lower lobe as these are unchanged from 05/23/2017. Heart is normal size. Calcification of the mitral valve annulus. Calcified plaque over the left main and 3 vessel coronary arteries. Calcified plaque over the thoracic aorta. There are calcified hilar mediastinal lymph nodes. Several bilateral pericardial phrenic lymph nodes unchanged. Several prominent lymph nodes adjacent the descending thoracic aorta unchanged. Hepatobiliary: Gallbladder not visualized. Liver and biliary tree are within normal. Pancreas: Normal. Spleen: Normal. Adrenals/Urinary Tract: Adrenal glands are within normal. Kidneys are normal size without hydronephrosis or nephrolithiasis. The ureters and bladder are unremarkable. Stomach/Bowel: Stomach and small bowel are normal. Appendix not visualized and likely has been resected. Minimal diverticulosis of the colon. Vascular/Lymphatic: Mild-to-moderate calcified plaque over the abdominal aorta. Moderate calcified plaque over the superior mesenteric artery. Stable small lymph nodes over the gastrohepatic ligament and upper periaortic region. Reproductive: Previous hysterectomy. Other: No free fluid or focal inflammatory change. Musculoskeletal: Degenerative change of the spine and hips. IMPRESSION: No acute findings in the abdomen/pelvis. Few small nodules over the lung bases unchanged from 05/23/2017 and likely due to granulomatous disease. There are stable pericardial phrenic and upper  abdominal nodes as described as well as multiple calcified hilar/mediastinal lymph nodes likely due to this presumed granulomatous process. Aortic Atherosclerosis (ICD10-I70.0). Atherosclerotic coronary artery disease. Minimal colonic diverticulosis. Electronically Signed   By: Marin Olp M.D.   On: 03/19/2018 15:48   Dg Chest 2 View  Result Date: 03/19/2018 CLINICAL DATA:  Syncopal episode.  Shortness of breath. EXAM: CHEST - 2 VIEW COMPARISON:  Chest radiograph March 15, 2018 and priors. FINDINGS: Cardiac silhouette is mildly enlarged. Calcified aortic arch. Diffuse interstitial prominence similar to  prior examination with bibasilar strandy densities. No pleural effusion or focal consolidation. RIGHT apical granuloma. Old LEFT rib fractures. ACDF. Moderate degenerative change of the spine. IMPRESSION: 1. Diffuse interstitial prominence likely in part reflecting chronic interstitial lung disease. Bibasilar atelectasis/scarring. 2. Stable cardiomegaly. Aortic Atherosclerosis (ICD10-I70.0). Electronically Signed   By: Elon Alas M.D.   On: 03/19/2018 13:44   Ct Head Wo Contrast  Result Date: 03/19/2018 CLINICAL DATA:  Near syncopal event. EXAM: CT HEAD WITHOUT CONTRAST CT CERVICAL SPINE WITHOUT CONTRAST TECHNIQUE: Multidetector CT imaging of the head and cervical spine was performed following the standard protocol without intravenous contrast. Multiplanar CT image reconstructions of the cervical spine were also generated. COMPARISON:  Head CT, 10/23/2015 FINDINGS: CT HEAD FINDINGS Brain: No evidence of acute infarction, hemorrhage, hydrocephalus, extra-axial collection or mass lesion/mass effect. Minor periventricular white matter hypoattenuation noted consistent with chronic microvascular ischemic change. Chronic basal gangliar calcifications. Vascular: No hyperdense vessel or unexpected calcification. Skull: Normal. Negative for fracture or focal lesion. Sinuses/Orbits: Globes and orbits are  unremarkable. The visualized sinuses and mastoid air cells are clear. Other: None. CT CERVICAL SPINE FINDINGS Alignment: Mild kyphosis, apex at C5-C6.  No spondylolisthesis. Skull base and vertebrae: No acute fracture. No primary bone lesion or focal pathologic process. Soft tissues and spinal canal: No prevertebral fluid or swelling. No visible canal hematoma. Disc levels: Status post anterior cervical disc fusion C4 through C7. Bone graft material in the fusion plate protrudes anterior fuse vertebra, which appears chronic. Moderate loss of disc height at C7-T1. Remaining unfused discs are well preserved in height. No disc herniation visualized. Upper chest: No masses or adenopathy. Dense carotid vascular calcifications. No acute findings. Lung apices are clear. Other: None. IMPRESSION: HEAD CT 1. No acute intracranial abnormalities. 2. Minor chronic microvascular ischemic change. CERVICAL CT 1. No fracture or acute finding. Electronically Signed   By: Lajean Manes M.D.   On: 03/19/2018 15:40   Ct Cervical Spine Wo Contrast  Result Date: 03/19/2018 CLINICAL DATA:  Near syncopal event. EXAM: CT HEAD WITHOUT CONTRAST CT CERVICAL SPINE WITHOUT CONTRAST TECHNIQUE: Multidetector CT imaging of the head and cervical spine was performed following the standard protocol without intravenous contrast. Multiplanar CT image reconstructions of the cervical spine were also generated. COMPARISON:  Head CT, 10/23/2015 FINDINGS: CT HEAD FINDINGS Brain: No evidence of acute infarction, hemorrhage, hydrocephalus, extra-axial collection or mass lesion/mass effect. Minor periventricular white matter hypoattenuation noted consistent with chronic microvascular ischemic change. Chronic basal gangliar calcifications. Vascular: No hyperdense vessel or unexpected calcification. Skull: Normal. Negative for fracture or focal lesion. Sinuses/Orbits: Globes and orbits are unremarkable. The visualized sinuses and mastoid air cells are clear.  Other: None. CT CERVICAL SPINE FINDINGS Alignment: Mild kyphosis, apex at C5-C6.  No spondylolisthesis. Skull base and vertebrae: No acute fracture. No primary bone lesion or focal pathologic process. Soft tissues and spinal canal: No prevertebral fluid or swelling. No visible canal hematoma. Disc levels: Status post anterior cervical disc fusion C4 through C7. Bone graft material in the fusion plate protrudes anterior fuse vertebra, which appears chronic. Moderate loss of disc height at C7-T1. Remaining unfused discs are well preserved in height. No disc herniation visualized. Upper chest: No masses or adenopathy. Dense carotid vascular calcifications. No acute findings. Lung apices are clear. Other: None. IMPRESSION: HEAD CT 1. No acute intracranial abnormalities. 2. Minor chronic microvascular ischemic change. CERVICAL CT 1. No fracture or acute finding. Electronically Signed   By: Lajean Manes M.D.   On: 03/19/2018 15:40  Assessment & Plan:   Problem List Items Addressed This Visit    Atherosclerosis of both carotid arteries    Noted on recent CT.  Refer for ultrasounds.  Resume statin       Relevant Medications   rosuvastatin (CRESTOR) 20 MG tablet   amLODipine (NORVASC) 5 MG tablet   Essential hypertension    Less than optimal control due to concurrent orthostasis.      Relevant Medications   rosuvastatin (CRESTOR) 20 MG tablet   amLODipine (NORVASC) 5 MG tablet   Functional diarrhea    Workup for infectious etiologies was done during Nov 8 admission.   Will stop the Aricept as it may be S/e of medication       Hospital discharge follow-up    Patient is stable post discharge and has had all issues or questions about discharge plans addressed  at the visit today for hospital follow up. All labs , imaging studies and progress notes from admission were reviewed with patient today        Leg pain    She is requesting change in therapy from Lyrica to gabapentin        Other Visit  Diagnoses    Carotid artery calcification, bilateral    -  Primary   Relevant Medications   rosuvastatin (CRESTOR) 20 MG tablet   amLODipine (NORVASC) 5 MG tablet   Other Relevant Orders   VAS US CAROTID      I have discontinued Angelia L. Merkin's donepezil and pregabalin. I am also having her start on gabapentin. Additionally, I am having her maintain her acetaminophen, nitroGLYCERIN, feeding supplement (GLUCERNA SHAKE), fluticasone, mometasone-formoterol, B-D UF III MINI PEN NEEDLES, glucose blood, glucose blood, blood glucose meter kit and supplies, BRILINTA, isosorbide mononitrate, dicyclomine, aspirin EC, oxyCODONE, levofloxacin, ALPRAZolam, carbamide peroxide, diphenoxylate-atropine, insulin aspart protamine- aspart, allopurinol, HUMALOG MIX 75/25 KWIKPEN, rosuvastatin, and amLODipine.  Meds ordered this encounter  Medications  . rosuvastatin (CRESTOR) 20 MG tablet    Sig: Take 1 tablet (20 mg total) by mouth daily.    Dispense:  90 tablet    Refill:  2    PLEASE ADD BACK  TO HER WEEKLY PILL PACKS  ASAP  . gabapentin (NEURONTIN) 100 MG capsule    Sig: Take 1 capsule (100 mg total) by mouth 3 (three) times daily.    Dispense:  90 capsule    Refill:  2  . amLODipine (NORVASC) 5 MG tablet    Sig: Take 1 tablet (5 mg total) by mouth daily.    Dispense:  90 tablet    Refill:  1    Medications Discontinued During This Encounter  Medication Reason  . donepezil (ARICEPT) 10 MG tablet   . pregabalin (LYRICA) 100 MG capsule   . amLODipine (NORVASC) 5 MG tablet Reorder    Follow-up: Return in about 2 months (around 06/17/2018) for follow up diabetes.   [name], MD

## 2018-04-16 NOTE — Patient Instructions (Addendum)
Resume the Crestor for your cholesterol   Stay off the Toprol and Losartan because you have orthostasis and  your blood pressure drops too low with position change.      STOP THE DONEPEZIL  For your memory,  IT MAY BE CAUSING DIARRHEA   LIMIT YOUR USE OF OXYCODONE BECAUSE IT IS CONSTIPATING  AND HABIT FORMING   YOU DON'T NEED TO TAKE GABAPENTIN AND LYRICA (PREGABALIN ) AT THE SAME TIME FOR NEUROPATHY   YOU CAN FINISH  THE PREGABALIN (LYRICA)  AND START THE GABAPENTIN 100MG  THREE TIMES DAILY STARTING WITH THE NEXT PILL PACK     YOUR SKIN IS ITCHY BECAUSE IT IS DRY.  EUCERIN IS THE BEST MOISTURIZER    I HAVE ORDERED A CAROTID ULTRASOUND TO BE DOEN AT YOUR HEART DOCTOR'S OFFICE

## 2018-04-18 DIAGNOSIS — I6523 Occlusion and stenosis of bilateral carotid arteries: Secondary | ICD-10-CM | POA: Insufficient documentation

## 2018-04-18 NOTE — Assessment & Plan Note (Signed)
Patient is stable post discharge and has had all issues or questions about discharge plans addressed  at the visit today for hospital follow up. All labs , imaging studies and progress notes from admission were reviewed with patient today

## 2018-04-18 NOTE — Assessment & Plan Note (Signed)
She is requesting change in therapy from Lyrica to gabapentin

## 2018-04-18 NOTE — Assessment & Plan Note (Signed)
Workup for infectious etiologies was done during Nov 8 admission.   Will stop the Aricept as it may be S/e of medication

## 2018-04-18 NOTE — Assessment & Plan Note (Signed)
Noted on recent CT.  Refer for ultrasounds.  Resume statin

## 2018-04-18 NOTE — Assessment & Plan Note (Signed)
Less than optimal control due to concurrent orthostasis.

## 2018-04-19 DIAGNOSIS — I5032 Chronic diastolic (congestive) heart failure: Secondary | ICD-10-CM | POA: Diagnosis not present

## 2018-04-19 DIAGNOSIS — E1122 Type 2 diabetes mellitus with diabetic chronic kidney disease: Secondary | ICD-10-CM | POA: Diagnosis not present

## 2018-04-19 DIAGNOSIS — N390 Urinary tract infection, site not specified: Secondary | ICD-10-CM | POA: Diagnosis not present

## 2018-04-19 DIAGNOSIS — N184 Chronic kidney disease, stage 4 (severe): Secondary | ICD-10-CM | POA: Diagnosis not present

## 2018-04-19 DIAGNOSIS — D631 Anemia in chronic kidney disease: Secondary | ICD-10-CM | POA: Diagnosis not present

## 2018-04-19 DIAGNOSIS — I130 Hypertensive heart and chronic kidney disease with heart failure and stage 1 through stage 4 chronic kidney disease, or unspecified chronic kidney disease: Secondary | ICD-10-CM | POA: Diagnosis not present

## 2018-04-20 DIAGNOSIS — N184 Chronic kidney disease, stage 4 (severe): Secondary | ICD-10-CM | POA: Diagnosis not present

## 2018-04-20 DIAGNOSIS — I5032 Chronic diastolic (congestive) heart failure: Secondary | ICD-10-CM | POA: Diagnosis not present

## 2018-04-20 DIAGNOSIS — I130 Hypertensive heart and chronic kidney disease with heart failure and stage 1 through stage 4 chronic kidney disease, or unspecified chronic kidney disease: Secondary | ICD-10-CM | POA: Diagnosis not present

## 2018-04-20 DIAGNOSIS — E1122 Type 2 diabetes mellitus with diabetic chronic kidney disease: Secondary | ICD-10-CM | POA: Diagnosis not present

## 2018-04-20 DIAGNOSIS — N390 Urinary tract infection, site not specified: Secondary | ICD-10-CM | POA: Diagnosis not present

## 2018-04-20 DIAGNOSIS — D631 Anemia in chronic kidney disease: Secondary | ICD-10-CM | POA: Diagnosis not present

## 2018-04-21 NOTE — Progress Notes (Addendum)
Cardiology Office Note  Date:  04/22/2018   ID:  TRANIECE BOFFA, DOB 12-11-1940, MRN [id number]  PCP:  [name], MD   Chief Complaint  Patient presents with  . other    6 month follow up. Meds reviewed by the pt. verbally. "doing well."     HPI:  Pamela Collier is a pleasant 77 year old woman with history of  diabetes type 2, coronary artery disease,  stent to the LAD, circumflex and RCA,  cardiac catheterizations 2010,2011, 2013, 2016 most recently in [place] with stent to the RCA at that time,  pulmonary sarcoidosis  with moderate airflow restriction, mild restrictive disease on PFTs in 2014, chronic wheezing requiring albuterol nebulizer, pleural effusion presenting 01/11/2015 on CT scan on the right that appear to resolve on follow-up x-rays the end of September 2016,  who presents for routine follow-up of her shortness of breath, chest pain  2 ER visits in November 2019 Nov 4 for chest pain. She rule out  Nov 8 for near syncope,   fall at home accompanied by dizziness, aggravated by chronic diarrhea and UTI  .  concussion, dehdyration,  Chronic diarrhea.  CT head,  Cervical spine    Dense carotid calcifications were noted.   negative for c dificile  CT was negative for diverticulitis/colitis.  Cr  2.1 to 1.55  UTI: treated with levaquin   Klebsiella by culture HTN: amlodipine 5 mg added and toprol stopped due to sinus pause.  ARB held  Still having diarrhea, rare, only 2x since she has been home  Lasix has been removed off her medicatiion list Denies any significant leg swelling, shortness of breath Lives at home with her husband, he has health issues low I well who is a  Stress test 11/2016 No ischemia, EF 43%  Echo 11/2016 55 to 60%, normal  Lab work reviewed Creatinine 1.55, down from 2.12 February 10, 2018 Total chol 124, LDL 60 HBA1C 7.7  EKG personally reviewed by myself on todays visit Normal sinus rhythm with rate 68 bpm right bundle branch  block, LVH with repolarization abnormality  Other past medical history reviewed In the ER 06/29/2016 sudden onset of sharp left-sided chest pain radiating to the back and abdomen at 8 AM, bilateral leg numbness. Had MRI neck: Multilevel cervical disc degeneration with moderate spinal stenosis at C4-5, C5-6, and C6-7.  Severe right neural foraminal stenosis at C5-6. Moderate neural foraminal stenosis on the left at C5-6 and C6-7. Referred to surgery in [place]. Scheduled to meet with them in 2 days on Thursday  Brace on her right foot for small ulceration, followed by Dr. Vickki Muff  denies any significant chest pain on today's visit, no shortness of breath Gait instability, no regular exercise program Stays in the house most of the time  EKG personally reviewed by myself on todays visit shows normal sinus rhythm with rate 60 bpm, LVH with nonspecific ST-T wave abnormality 1 and aVL, unchanged from previous EKG  Other past medical history reviewed Sleeps with CPAP, not working? Choking, takes sips of water, Uses in the day when napping,  Only uses it for 4 hours a day today  Previous Echo 01/02/16 reviewed, normal EF, and RVSP Inferior wall HK   echocardiogram May 2016 showed normal ejection fraction, This was done in [place], no mention of right heart pressures. Prior echocardiogram at the end of 2015 done at [facility] documented ejection fraction 45%  CT scan images reviewed with her in detail. CT from 01/11/2015  showed mild to moderate sized pleural effusion on the right extending from base to apex in a supine position. Repeat chest x-ray at the end of September showed no significant effusion  Symptoms seem to present after she had colonoscopy, possibly had IV fluids during the procedure, symptoms improved on higher dose Lasix  She does report having pleural effusions in the past requiring thoracentesis  Cardiac catheterization from January 2013 documenting ejection fraction 35-40%, 40%  mid LAD, 50% distal LAD, small diagonal with 70% disease, 50% proximal left circumflex, 75% distal left circumflex with patent stent, small RCA, calcified, severe atherosclerosis, 90% distal RCA  Details from catheterization March 2016 are not available, request made to hospital in [place] on today's visit for records   PMH:   has a past medical history of Acute respiratory failure ([place]) (11/21/2014), Anxiety, Arthritis (08/05/2013), Atherosclerotic peripheral vascular disease with gangrene [facility]) (august 2012), Cardiomyopathy, ischemic, Cerebral infarct ([place]) (08/17/2013), Chronic diastolic CHF (congestive heart failure) ([place]), COPD (chronic obstructive pulmonary disease) ([place]), Coronary artery disease, occlusive, Depression with anxiety (04/03/2012), Diabetic diarrhea ([place]) (10/03/2014), DM type 2, uncontrolled, with renal complications ([place]) (05/13/9415), Hepatic steatosis, History of kidney stones, Hyperlipidemia LDL goal <100 (02/23/2014), Hypertensive heart disease, Osteoporosis, post-menopausal, Peripheral vascular disease due to secondary diabetes mellitus [facility]) (July 2011), Pill esophagitis, Pleural effusion (10/25/2012), Pulmonary sarcoidosis ([place]) (12/07/2012), Sleep apnea, and Stage III chronic kidney disease ([place]).  PSH:    Past Surgical History:  Procedure Laterality Date  . ABDOMINAL HYSTERECTOMY     at ge 39. secondary to bleeding/partial  . ANTERIOR CERVICAL DECOMP/DISCECTOMY FUSION N/A 10/10/2016   Procedure: Anterior Cervical Discectomy Fusion - Cervical four4- five - Cervical five-Cervical six - Cervical six-Cervical seven;  Surgeon: Earnie Larsson, MD;  Location: [place];  Service: Neurosurgery;  Laterality: N/A;  . BREAST CYST ASPIRATION Right   . CARPAL TUNNEL RELEASE Bilateral   . CHOLECYSTECTOMY     in [place]   . COLONOSCOPY WITH PROPOFOL N/A 01/09/2015   Procedure: COLONOSCOPY WITH PROPOFOL;  Surgeon: Lucilla Lame, MD;  Location: [facility] ENDOSCOPY;  Service: Endoscopy;  Laterality:  N/A;  . CORONARY ANGIOPLASTY WITH STENT PLACEMENT     [place]; [facility]  . EYE SURGERY     bil cataracts  . HERNIA REPAIR     umbilical/ Dr Pat Patrick  . OOPHORECTOMY    . PTCA  August 2012   Right Posterior tibial artery , [place]  . TOE AMPUTATION  Sept 2012   Right 2nd toe, [place]    Current Outpatient Medications  Medication Sig Dispense Refill  . acetaminophen (TYLENOL) 500 MG tablet Take 1,000 mg by mouth every 6 (six) hours as needed for mild pain or headache.     . allopurinol (ZYLOPRIM) 100 MG tablet Take 1 tablet (100 mg total) by mouth daily. 30 tablet 0  . amLODipine (NORVASC) 5 MG tablet Take 1 tablet (5 mg total) by mouth daily. 90 tablet 1  . aspirin EC 81 MG tablet Take 81 mg by mouth daily.    . B-D UF III MINI PEN NEEDLES 31G X 5 MM MISC USE THREE TIMES A DAY 270 each 4  . blood glucose meter kit and supplies Dispense based on patient and insurance preference. Use up to three times daily as directed. (FOR ICD-10 E11.21) 1 each 0  . BRILINTA 90 MG TABS tablet Take 1 tablet (90 mg total) by mouth 2 (two) times daily. 180 tablet 2  . dicyclomine (BENTYL) 10  MG capsule Take 1 capsule (10 mg total) by mouth 4 (four) times daily -  before meals and at bedtime. 120 capsule 0  . diphenoxylate-atropine (LOMOTIL) 2.5-0.025 MG tablet Take 1 tablet by mouth 4 (four) times daily as needed for diarrhea or loose stools. 10 tablet 0  . feeding supplement, GLUCERNA SHAKE, (GLUCERNA SHAKE) LIQD Take 237 mLs by mouth 2 (two) times daily between meals. 60 Can 0  . fluticasone (FLONASE) 50 MCG/ACT nasal spray Place 2 sprays into both nostrils daily as needed for rhinitis. 16 g 5  . gabapentin (NEURONTIN) 100 MG capsule Take 1 capsule (100 mg total) by mouth 3 (three) times daily. 90 capsule 2  . glucose blood (ACCU-CHEK AVIVA PLUS) test strip USE TO CHECK GLUCOSE THREE TIMES DAILY 200 each 5  . glucose blood test strip Use to check blood sugars 2 times daily 100 each 12  .  HUMALOG MIX 75/25 KWIKPEN (75-25) 100 UNIT/ML Kwikpen     . insulin aspart protamine- aspart (NOVOLOG MIX 70/30) (70-30) 100 UNIT/ML injection Inject 0.3 mLs (30 Units total) into the skin 2 (two) times daily with a meal. 10 mL 11  . isosorbide mononitrate (IMDUR) 30 MG 24 hr tablet Take 1 tablet (30 mg total) by mouth daily. 90 tablet 2  . mometasone-formoterol (DULERA) 200-5 MCG/ACT AERO Inhale 2 puffs into the lungs 2 (two) times daily. 13 g 3  . nitroGLYCERIN (NITROLINGUAL) 0.4 MG/SPRAY spray Place 1 spray under the tongue every 5 (five) minutes x 3 doses as needed for chest pain. 12 g 12  . oxyCODONE (OXY IR/ROXICODONE) 5 MG immediate release tablet Take 1 tablet (5 mg total) by mouth every 6 (six) hours as needed for moderate pain. 12 tablet 0  . rosuvastatin (CRESTOR) 20 MG tablet Take 1 tablet (20 mg total) by mouth daily. 90 tablet 2  . ALPRAZolam (XANAX) 0.25 MG tablet Take 1 tablet (0.25 mg total) by mouth daily. (Patient not taking: Reported on 04/22/2018) 30 tablet 0   No current facility-administered medications for this visit.    Facility-Administered Medications Ordered in Other Visits  Medication Dose Route Frequency Provider Last Rate Last Dose  . albuterol (PROVENTIL) (5 MG/ML) 0.5% nebulizer solution 2.5 mg  2.5 mg Nebulization Once [name], MD         Allergies:   Cephalexin; Contrast media [iodinated diagnostic agents]; Doxycycline; Gabapentin; and Sulfa antibiotics   Social History:  The patient  reports that she has never smoked. She has never used smokeless tobacco. She reports that she does not drink alcohol or use drugs.   Family History:   family history includes Asthma in her grandchild; Breast cancer in her maternal aunt; Heart attack in her brother, father, and sister; Heart disease in her brother, father, and sister; Tremor in her mother.    Review of Systems: Review of Systems  Constitutional: Negative.   Respiratory: Negative.   Cardiovascular:  Negative.   Gastrointestinal: Negative.   Musculoskeletal: Negative.        Gait instability  Psychiatric/Behavioral: Negative.   All other systems reviewed and are negative.   PHYSICAL EXAM: VS:  BP (!) 130/58 (BP Location: Left Arm, Patient Position: Sitting, Cuff Size: Normal)   Pulse 68   Ht 5' 7" (1.702 m)   Wt 188 lb 8 oz (85.5 kg)   BMI 29.52 kg/m  , BMI Body mass index is 29.52 kg/m. Constitutional:  oriented to person, place, and time. No distress.  HENT:  Head:  Grossly normal Eyes:  no discharge. No scleral icterus. Neck: No JVD, no carotid bruits  Cardiovascular: Regular rate and rhythm, no murmurs appreciated Pulmonary/Chest: Clear to auscultation bilaterally, no wheezes or rails Abdominal: Soft.  no distension.  no tenderness.  Musculoskeletal: Normal range of motion Neurological:  normal muscle tone. Coordination normal. No atrophy Skin: Skin warm and dry Psychiatric: normal affect, pleasant   Recent Labs: 02/18/2018: TSH 1.23 03/19/2018: ALT 14; Hemoglobin 9.8; Platelets 257 03/22/2018: BUN 24; Creatinine, Ser 1.55; Potassium 4.6; Sodium 136    Lipid Panel Lab Results  Component Value Date   CHOL 124 11/15/2016   HDL 29 (L) 11/15/2016   LDLCALC 60 11/15/2016   TRIG 175 (H) 11/15/2016      Wt Readings from Last 3 Encounters:  04/22/18 188 lb 8 oz (85.5 kg)  04/16/18 187 lb 12.8 oz (85.2 kg)  03/26/18 184 lb (83.5 kg)    ASSESSMENT AND PLAN:  Angina pectoris (HCC) -   stress test with no ischemia Echocardiogram with normal ejection fraction Appears euvolemic on today's visit, no changes to her medication  Chest pain, unspecified chest pain type -  Long history of periodic chest pain, atypical in nature No further work-up at this time  SOB (shortness of breath) -  Recent dehydration, now off Lasix History of pulmonary sarcoid Deconditioned at baseline likely contributing to some of her symptoms  Type 2 diabetes Recommended low  carbohydrate diet  Diarrhea Moderately improved Likely contributing to recent hospitalization for dehydration    Total encounter time more than 25 minutes  Greater than 50% was spent in counseling and coordination of care with the patient   Disposition:   F/U  12 months   Orders Placed This Encounter  Procedures  . EKG 12-Lead     Signed, Esmond Plants, M.D., Ph.D. 04/22/2018  [place], [place]

## 2018-04-22 ENCOUNTER — Encounter: Payer: Self-pay | Admitting: Cardiovascular Disease

## 2018-04-22 ENCOUNTER — Ambulatory Visit: Payer: Medicare Other | Admitting: Internal Medicine

## 2018-04-22 ENCOUNTER — Ambulatory Visit (INDEPENDENT_AMBULATORY_CARE_PROVIDER_SITE_OTHER): Payer: Medicare Other | Admitting: Cardiovascular Disease

## 2018-04-22 VITALS — BP 130/58 | HR 68 | Ht 67.0 in | Wt 188.5 lb

## 2018-04-22 DIAGNOSIS — E1351 Other specified diabetes mellitus with diabetic peripheral angiopathy without gangrene: Secondary | ICD-10-CM | POA: Diagnosis not present

## 2018-04-22 DIAGNOSIS — I5032 Chronic diastolic (congestive) heart failure: Secondary | ICD-10-CM

## 2018-04-22 DIAGNOSIS — N183 Chronic kidney disease, stage 3 (moderate): Secondary | ICD-10-CM

## 2018-04-22 DIAGNOSIS — I10 Essential (primary) hypertension: Secondary | ICD-10-CM | POA: Diagnosis not present

## 2018-04-22 DIAGNOSIS — I129 Hypertensive chronic kidney disease with stage 1 through stage 4 chronic kidney disease, or unspecified chronic kidney disease: Secondary | ICD-10-CM | POA: Diagnosis not present

## 2018-04-22 DIAGNOSIS — E785 Hyperlipidemia, unspecified: Secondary | ICD-10-CM | POA: Diagnosis not present

## 2018-04-22 DIAGNOSIS — E1122 Type 2 diabetes mellitus with diabetic chronic kidney disease: Secondary | ICD-10-CM

## 2018-04-22 DIAGNOSIS — I25118 Atherosclerotic heart disease of native coronary artery with other forms of angina pectoris: Secondary | ICD-10-CM | POA: Diagnosis not present

## 2018-04-22 DIAGNOSIS — I255 Ischemic cardiomyopathy: Secondary | ICD-10-CM | POA: Diagnosis not present

## 2018-04-22 MED ORDER — TICAGRELOR 60 MG PO TABS: 60.0000 mg | ORAL_TABLET | Freq: Two times a day (BID) | ORAL | 11 refills | Status: AC

## 2018-04-22 NOTE — Progress Notes (Deleted)
* [facility]     Assessment and Plan:  The patient is a 77 year old female with dyspnea, and weakness with debility, deconditioning, sleep apnea, and sarcoidosis.   Obstructive sleep apnea. --Currently doing well with auto-cpap machine with settings 5-15 cm H2O. - She is currently in a rehab facility, I have advised her to use her CPAP machine there as well.  Pulmonary sarcoidosis with lung nodules. --Patient has remote history of sarcoidosis, mainly manifest as chylous pleural effusion, as well as lung nodules.  Most recent CT chest shows a 5 mm right-sided lung nodule.  We will repeat CT chest to follow-up, however the patient is already had 7 CT scans this year, therefore will defer this non-urgent scan to 3 months.  Is stable at that time can consider further surveillance with chest x-ray. - Patient has mild hypercalcemia with acute on chronic kidney injury.  Uncertain if her hypercalcemia is due to worsening sarcoidosis versus renal failure, will continue to monitor.  We will check an ACE level today.  Repeat ACE and metabolic panel in 3 months.  Her sarcoidosis is typically manifested as increasing chylous pleural effusions, therefore as her current pleural effusion appears improved I am less concerned about recurrent or worsening sarcoidosis. --PPSV 23 02/26/12; PCV13, 07/01/13.   Chylous pleural effusion. - This appears resolved on most recent CT chest, will continue to monitor.  Elevated right diaphragm. -The patient has decreased air entry in the right lung base, review of her chest x-ray shows an elevated right diaphragm, possibly with a diaphragmatic paralysis. This may be related to her previous history of chylous pleural effusion. 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  Chronic debility and deconditioning. --I suspect that this is the greatest contributor to her exertional dyspnea.  --Continue to increase activity as  tolerated.   Chronic bronchitis. --Continue using symbicort bid.    No orders of the defined types were placed in this encounter.    No follow-ups on file.    Date: 04/22/2018  MRN# [id number] Pamela Collier [birth year]   Pamela Collier is a 77 y.o. old female seen in follow up for chief complaint of  No chief complaint on file.   Subjective: The patient is a 77 year old female, diagnosed more than 20 years ago by mediastinal biopsy with sarcoidosis. 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 She has a history of sleep apnea, and has been maintained on CPAP.  At her recent visit with her primary care physician it was noted that she had acute kidney injury as well as hypercalcemia, she was therefore referred back for possible recurrence of sarcoidosis.  At her last visit she had been in a rehab facility after a fall.   **Chest x-ray 03/19/2018 CT abdomen pelvis 03/19/2018>> images personally reviewed of chest x-ray as well as lower cuts of lungs, which continues to show bibasilar atelectasis, slightly elevated right diaphragm, previously seen lung nodule and calcified hilar lymphadenopathy, not significantly changed since previous. **CT AP 03/19/2018>> images personally reviewed, in comparison with previous scan on 06/03/2017 and 05/23/2017 the previously seen right pleural effusion has resolved **CPAP download 02/24/2018-03/25/2018>> usage greater than 4 hours is 22/30 days.  Average usage on days used is 5 hours 49 minutes.  Pressure ranges 5-15.  Leaks are within normal limits.  Median pressure 9, 95th percentile pressure 12, maximum pressure 13.  Residual AHI is 2.  Overall this shows adequate  compliance with CPAP with excellent control of obstructive sleep apnea. **Chest x-ray 03/19/2018>> images personally reviewed.  Scattered bibasilar interstitial changes, bibasilar atelectasis, elevated right diaphragm.  Review of CT AP  images from the same day are mild scattered interstitial changes in both bases. **CT chest 06/03/2017>> images personally reviewed, scattered bibasilar interstitial changes, with mild right basal pleural effusion.  Mild right hilar prominence, which may be an intrapulmonary lymph node. **chest x-ray 09/12/15, bibasilar atelectasis, minimal pleural effusions, scattered interstitial scarring.  10/2012 CT chest >> small to moderate R lung effusion>> chylothorax, 100% lymphs 10/2013 thoracentesis> cytology negative, WBC 1471, > 90% "small lymphs" 01/2014 CT chest> near complete resolution of pleural effusion, stable lymphadenopathy 08/2014 CT chest [place] ([facility]): small right sided effusion decreased in size, stable mediastinal lymphadenopathy 1.0cm largest, 39m **Desat walk 09/17/16; at rest on RA her sat was 98% and HR 74. After 180 feet sat was 96% and HR 84, slow pace, minimal dyspnea.  After 360 feet; sat was 95% and HR 92; moderate dyspnea.   Results for Pamela Collier(MRN [id number] as of 03/25/2018 14:02  Ref. Range 03/22/2018 09:16  Calcium Latest Ref Range: 8.9 - 10.3 mg/dL 10.5 (H)    Medication:   Outpatient Encounter Medications as of 04/22/2018  Medication Sig  . acetaminophen (TYLENOL) 500 MG tablet Take 1,000 mg by mouth every 6 (six) hours as needed for mild pain or headache.   . allopurinol (ZYLOPRIM) 100 MG tablet Take 1 tablet (100 mg total) by mouth daily.  .Marland KitchenALPRAZolam (XANAX) 0.25 MG tablet Take 1 tablet (0.25 mg total) by mouth daily. (Patient not taking: Reported on 04/16/2018)  . amLODipine (NORVASC) 5 MG tablet Take 1 tablet (5 mg total) by mouth daily.  .Marland Kitchenaspirin EC 81 MG tablet Take 81 mg by mouth daily.  . B-D UF III MINI PEN NEEDLES 31G X 5 MM MISC USE THREE TIMES A DAY  . blood glucose meter kit and supplies Dispense based on patient and insurance preference. Use up to three times daily as directed. (FOR ICD-10 E11.21)  . BRILINTA 90 MG  TABS tablet Take 1 tablet (90 mg total) by mouth 2 (two) times daily.  . carbamide peroxide (DEBROX) 6.5 % OTIC solution Place 5 drops into the right ear 2 (two) times daily. (Patient not taking: Reported on 04/16/2018)  . dicyclomine (BENTYL) 10 MG capsule Take 1 capsule (10 mg total) by mouth 4 (four) times daily -  before meals and at bedtime.  . diphenoxylate-atropine (LOMOTIL) 2.5-0.025 MG tablet Take 1 tablet by mouth 4 (four) times daily as needed for diarrhea or loose stools. (Patient not taking: Reported on 04/16/2018)  . feeding supplement, GLUCERNA SHAKE, (GLUCERNA SHAKE) LIQD Take 237 mLs by mouth 2 (two) times daily between meals.  . fluticasone (FLONASE) 50 MCG/ACT nasal spray Place 2 sprays into both nostrils daily as needed for rhinitis. (Patient not taking: Reported on 04/16/2018)  . gabapentin (NEURONTIN) 100 MG capsule Take 1 capsule (100 mg total) by mouth 3 (three) times daily.  .Marland Kitchenglucose blood (ACCU-CHEK AVIVA PLUS) test strip USE TO CHECK GLUCOSE THREE TIMES DAILY  . glucose blood test strip Use to check blood sugars 2 times daily  . HUMALOG MIX 75/25 KWIKPEN (75-25) 100 UNIT/ML Kwikpen   . insulin aspart protamine- aspart (NOVOLOG MIX 70/30) (70-30) 100 UNIT/ML injection Inject 0.3 mLs (30 Units total) into the skin 2 (two) times daily with a meal.  . isosorbide mononitrate (  IMDUR) 30 MG 24 hr tablet Take 1 tablet (30 mg total) by mouth daily.  [name] levofloxacin (LEVAQUIN) 250 MG tablet Take 1 tablet (250 mg total) by mouth daily. (Patient not taking: Reported on 04/16/2018)  . mometasone-formoterol (DULERA) 200-5 MCG/ACT AERO Inhale 2 puffs into the lungs 2 (two) times daily. (Patient not taking: Reported on 04/16/2018)  . nitroGLYCERIN (NITROLINGUAL) 0.4 MG/SPRAY spray Place 1 spray under the tongue every 5 (five) minutes x 3 doses as needed for chest pain. (Patient not taking: Reported on 04/16/2018)  . oxyCODONE (OXY IR/ROXICODONE) 5 MG immediate release tablet Take 1 tablet (5 mg  total) by mouth every 6 (six) hours as needed for moderate pain. (Patient not taking: Reported on 04/16/2018)  . rosuvastatin (CRESTOR) 20 MG tablet Take 1 tablet (20 mg total) by mouth daily.   Facility-Administered Encounter Medications as of 04/22/2018  Medication  . albuterol (PROVENTIL) (5 MG/ML) 0.5% nebulizer solution 2.5 mg     Allergies:  Cephalexin; Contrast media [iodinated diagnostic agents]; Doxycycline; Gabapentin; and Sulfa antibiotics       LABORATORY PANEL:   CBC No results for input(s): WBC, HGB, HCT, PLT in the last 168 hours. ------------------------------------------------------------------------------------------------------------------  Chemistries  No results for input(s): NA, K, CL, CO2, GLUCOSE, BUN, CREATININE, CALCIUM, MG, AST, ALT, ALKPHOS, BILITOT in the last 168 hours.  Invalid input(s): GFRCGP ------------------------------------------------------------------------------------------------------------------  Cardiac Enzymes No results for input(s): TROPONINI in the last 168 hours. ------------------------------------------------------------  RADIOLOGY:   No results found for this or any previous visit. Results for orders placed during the hospital encounter of 09/12/15  DG Chest 2 View   Narrative CLINICAL DATA:  Shortness of breath and chest pain. History of sarcoidosis  EXAM: CHEST  2 VIEW  COMPARISON:  Chest radiograph June 28, 2015 and chest CT July 11, 2015  FINDINGS: There is no edema or consolidation. Heart size and pulmonary vascularity are normal. No adenopathy. Calcified left mediastinal lymph nodes likely is secondary to sarcoidosis. No bone lesions. No pneumothorax.  IMPRESSION: Calcified lymph nodes, a finding likely secondary to prior sarcoidosis. No edema or consolidation.   Electronically Signed   By: Lowella Grip III M.D.   On: 09/12/2015 12:46     ------------------------------------------------------------------------------------------------------------------  Thank  you for allowing [facility] to assist in the care of your patient. Our recommendations are noted above.  Please contact us if we can be of further service.  Marda Stalker, M.D., F.C.C.P.  Board Certified in Internal Medicine, Pulmonary Medicine, [place], and Sleep Medicine.  [facility] Office Number: [phone]  04/22/2018

## 2018-04-22 NOTE — Patient Instructions (Addendum)
We will help to schedule a carotid u/s, previously ordered by Dr. Derrel Nip  Medication Instructions:   Please decrease the brilinta down to 60 mg twice a day  If you need a refill on your cardiac medications before your next appointment, please call your pharmacy.    Lab work: No new labs needed   If you have labs (blood work) drawn today and your tests are completely normal, you will receive your results only by: [name] MyChart Message (if you have MyChart) OR . A paper copy in the mail If you have any lab test that is abnormal or we need to change your treatment, we will call you to review the results.   Testing/Procedures: No new testing needed   Follow-Up: At [facility], you and your health needs are our priority.  As part of our continuing mission to provide you with exceptional heart care, we have created designated Provider Care Teams.  These Care Teams include your primary Cardiologist (physician) and Advanced Practice Providers (APPs -  Physician Assistants and Nurse Practitioners) who all work together to provide you with the care you need, when you need it.  . You will need a follow up appointment in 12 months .   Please call our office 2 months in advance to schedule this appointment.    . Providers on your designated Care Team:   . Murray Hodgkins, NP . Christell Faith, PA-C . Marrianne Mood, PA-C  Any Other Special Instructions Will Be Listed Below (If Applicable).  For educational health videos Log in to : [email or web address] Or : [email or web address], password : [email or web address]

## 2018-04-23 ENCOUNTER — Other Ambulatory Visit: Payer: Self-pay | Admitting: *Deleted

## 2018-04-23 ENCOUNTER — Encounter: Payer: Self-pay | Admitting: *Deleted

## 2018-04-23 DIAGNOSIS — D631 Anemia in chronic kidney disease: Secondary | ICD-10-CM | POA: Diagnosis not present

## 2018-04-23 DIAGNOSIS — I5032 Chronic diastolic (congestive) heart failure: Secondary | ICD-10-CM | POA: Diagnosis not present

## 2018-04-23 DIAGNOSIS — N390 Urinary tract infection, site not specified: Secondary | ICD-10-CM | POA: Diagnosis not present

## 2018-04-23 DIAGNOSIS — I130 Hypertensive heart and chronic kidney disease with heart failure and stage 1 through stage 4 chronic kidney disease, or unspecified chronic kidney disease: Secondary | ICD-10-CM | POA: Diagnosis not present

## 2018-04-23 DIAGNOSIS — N184 Chronic kidney disease, stage 4 (severe): Secondary | ICD-10-CM | POA: Diagnosis not present

## 2018-04-23 DIAGNOSIS — E1122 Type 2 diabetes mellitus with diabetic chronic kidney disease: Secondary | ICD-10-CM | POA: Diagnosis not present

## 2018-04-23 NOTE — Patient Outreach (Addendum)
[facility]) Care Management   04/23/2018  Pamela Collier 06/28/1940 [id number]  Pamela Collier is an 77 y.o. female  Subjective:  Patient discussed feeling stronger each day.   Objective: BP 140/80 (BP Location: Right Arm, Patient Position: Sitting, Cuff Size: Normal)   Pulse 68   Resp 18   Ht 1.702 m (5' 7" )   Wt 187 lb (84.8 kg)   SpO2 96%   BMI 29.29 kg/m   ,Review of Systems  Constitutional: Negative.   HENT: Negative.   Eyes: Negative.   Respiratory: Negative.   Cardiovascular: Negative.   Gastrointestinal: Negative.   Genitourinary: Negative.   Musculoskeletal: Positive for back pain.       Back and leg pain at times   Skin: Negative.   Neurological: Negative.   Endo/Heme/Allergies: Negative.   Psychiatric/Behavioral: Positive for memory loss.    Physical Exam  Constitutional: She is oriented to person, place, and time. She appears well-developed and well-nourished.  Cardiovascular: Normal rate, normal heart sounds and intact distal pulses.  Respiratory: Effort normal and breath sounds normal.  GI: Soft. Bowel sounds are normal.  Neurological: She is alert and oriented to person, place, and time.  Skin: Skin is warm and dry.  Left foot 3rd toe with callus discolored area.  Psychiatric: She has a normal mood and affect. Her behavior is normal. Judgment and thought content normal.    Encounter Medications:   Outpatient Encounter Medications as of 04/23/2018  Medication Sig Note  . acetaminophen (TYLENOL) 500 MG tablet Take 1,000 mg by mouth every 6 (six) hours as needed for mild pain or headache.    . allopurinol (ZYLOPRIM) 100 MG tablet Take 1 tablet (100 mg total) by mouth daily.   [name] amLODipine (NORVASC) 5 MG tablet Take 1 tablet (5 mg total) by mouth daily.   [name] aspirin EC 81 MG tablet Take 81 mg by mouth daily.   . B-D UF III MINI PEN NEEDLES 31G X 5 MM MISC USE THREE TIMES A DAY   . blood glucose meter kit and supplies Dispense based  on patient and insurance preference. Use up to three times daily as directed. 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 dicyclomine (BENTYL) 10 MG capsule Take 1 capsule (10 mg total) by mouth 4 (four) times daily -  before meals and at bedtime.   . diphenoxylate-atropine (LOMOTIL) 2.5-0.025 MG tablet Take 1 tablet by mouth 4 (four) times daily as needed for diarrhea or loose stools.   [name] glucose blood (ACCU-CHEK AVIVA PLUS) test strip USE TO CHECK GLUCOSE THREE TIMES DAILY   . glucose blood test strip Use to check blood sugars 2 times daily   . HUMALOG MIX 75/25 KWIKPEN (75-25) 100 UNIT/ML Kwikpen    . insulin aspart protamine- aspart (NOVOLOG MIX 70/30) (70-30) 100 UNIT/ML injection Inject 0.3 mLs (30 Units total) into the skin 2 (two) times daily with a meal.   . isosorbide mononitrate (IMDUR) 30 MG 24 hr tablet Take 1 tablet (30 mg total) by mouth daily.   . mometasone-formoterol (DULERA) 200-5 MCG/ACT AERO Inhale 2 puffs into the lungs 2 (two) times daily.   . nitroGLYCERIN (NITROLINGUAL) 0.4 MG/SPRAY spray Place 1 spray under the tongue every 5 (five) minutes x 3 doses as needed for chest pain.   [name] oxyCODONE (OXY IR/ROXICODONE) 5 MG immediate release tablet Take 1 tablet (5 mg total) by mouth every 6 (six) hours as needed for moderate pain.   . rosuvastatin (CRESTOR) 20  MG tablet Take 1 tablet (20 mg total) by mouth daily.   . ticagrelor (BRILINTA) 60 MG TABS tablet Take 1 tablet (60 mg total) by mouth 2 (two) times daily.   [name] ALPRAZolam (XANAX) 0.25 MG tablet Take 1 tablet (0.25 mg total) by mouth daily. (Patient not taking: Reported on 04/22/2018)   . feeding supplement, GLUCERNA SHAKE, (GLUCERNA SHAKE) LIQD Take 237 mLs by mouth 2 (two) times daily between meals. 03/03/2018: Takes once a day due to cost  . fluticasone (FLONASE) 50 MCG/ACT nasal spray Place 2 sprays into both nostrils daily as needed for rhinitis.   [name] gabapentin (NEURONTIN) 100 MG capsule Take 1 capsule (100 mg total) by mouth 3 (three) times  daily. (Patient not taking: Reported on 04/23/2018)    Facility-Administered Encounter Medications as of 04/23/2018  Medication  . albuterol (PROVENTIL) (5 MG/ML) 0.5% nebulizer solution 2.5 mg    Functional Status:   In your present state of health, do you have any difficulty performing the following activities: 04/23/2018 03/19/2018  Hearing? N N  Vision? N N  Difficulty concentrating or making decisions? Y N  Walking or climbing stairs? Y Y  Dressing or bathing? N N  Doing errands, shopping? Y N  Comment husband assist at times  -  Conservation officer, nature and eating ? Y -  Comment husband assist and has mow -  Using the Toilet? N -  In the past six months, have you accidently leaked urine? Y -  Comment - -  Do you have problems with loss of bowel control? N -  Managing your Medications? Y -  Comment has pill packaging  -  Managing your Finances? N -  Housekeeping or managing your Housekeeping? Y -  Comment spouse assist  -  Some recent data might be hidden    Fall/Depression Screening:    Fall Risk  04/23/2018 02/25/2018 08/24/2017  Falls in the past year? 1 Yes Yes  Comment - - -  Number falls in past yr: 1 1 1   Injury with Fall? 0 No -  Risk Factor Category  - - -  Risk for fall due to : Impaired balance/gait;History of fall(s) History of fall(s);Impaired balance/gait History of fall(s)  Risk for fall due to: Comment - - -  Follow up - Falls prevention discussed Falls prevention discussed   PHQ 2/9 Scores 04/23/2018 08/24/2017 11/10/2016 07/01/2016 02/12/2016 12/03/2015 10/10/2015  PHQ - 2 Score 1 3 0 0 3 0 1  PHQ- 9 Score - 6 - - 7 - -  Exception Documentation - - - - - - -    Assessment:  Routine home visit. Patient husband present they discussed plans to return to [place] to be close to their children.  Patient spouse put meals on wheel on hold during time patient was in rehab and plans to call to resume mobile meals on today.   Fall risk - feeling stronger participating in  home health PT/OT.  Leg Pain Patient reports taking less oxycodone, do to concern it may cause dizziness.Patient reports some relief with elevating legs. Patient discussed that she will change back to gabapentin for neuropathy with new pill packaging.   Diabetes  Patient checking blood sugars twice daily and keeping a record. No low blood sugar episode or complaint of symptoms. Reviewed meter for recent readings  Left foot 3rd toe with callus, dark discolored patient discussed need to reschedule podiatry visit that she missed while in rehab.  Before meals am- [phone]  After meals evening 185,168,150, [phone] Patient continues to focus on balanced meals.  Heart failure  Patient with recent cardiology office visit. Patient does have slight edema lower extremities, denies shortness of breath . Patient will resume daily weight. Reviewed heart failure yellow zone symptoms, patient acknowledges awareness of symptoms to notify MD of . Patient reports not adding salt to foods.  Medications  Patient able to verbalize changes to Brilinta and states  home health RN placed call to pharmacy today  to verify Brilinta will be changed in next pill package.  Patient pleased with having pill packaging.           Patient was recently discharged from hospital and all medications have been reviewed.   Plan:  Will plan weekly transition of care call in the next week.  Will send PCP visit note.   [facility] CM Care Plan Problem One     Most Recent Value  Care Plan Problem One  Risk for readmission ,recent hospital and rehab stay related to fall and syncope   Role Documenting the Problem One  Care Management [place] for Problem One  Active  [facility] Long Term Goal   Patient will not experience a hospital admission over the next 31 days   [facility]N Long Term Goal Start Date  04/15/18  [facility] CM Short Term Goal #1   Patient will be able to report attending all medical appointments over the next 30 days    [facility] CM Short Term Goal #1 Start Date  04/15/18  Interventions for Short Term Goal #1  Discussed recent medical appointment attended, Patient agreeable to rescheduling podiatry visit .   (Pended)   [facility] CM Short Term Goal #2   Patient will be able to reports no falls over the next 30 days   [facility] CM Short Term Goal #2 Start Date  04/15/18  Interventions for Short Term Goal #2  Discussed continuing to participate in home therapy exercises.Advised regarding wearing supportive shoes for safety in home with mobility .Reviewed EMMI on preventing falls     (Pended)   [facility] CM Short Term Goal #3  Patient will be able to verbalize improvement in leg  pain over the next 30 days as evidenced by report.   [facility] CM Short Term Goal #3 Start Date  04/15/18  Interventions for Short Tern Goal #3  Discussed current status of improvment , encouraged elevating legs while  sitting , and use of posiitioning wtih pillow for comfort in bed.   (Pended)     [facility] CM Care Plan Problem Two     Most Recent Value  Care Plan Problem Two  Diabetes self care management knowledge deficit   Role Documenting the Problem Two  Care Management Coordinator  Care Plan for Problem Two  Active  Interventions for Problem Two Long Term Goal   Teach back on treatment of hypoglycemia , as well as symptoms, provided and reviewed EMMI on Diabetes taking care of self day to day.   (Pended)   [facility]N Long Term Goal  Patient  will be able to verbalize at least 2 measures to managing diabetes control over the next 60 days  THN Long Term Goal Start Date  04/15/18  (Pended)   [facility] CM Short Term Goal #1   Patient will be able to report monitoring blood sugars at least 2 twice daily over the next 30 days and keeping a record.   [facility] CM Short Term Goal #1 Start Date  04/15/18  Interventions for Short Term Goal #2   Reviewed CBG readings and encouraged continued keeping a record of readings.   (Pended)       [name], RN, [place]  Management Coordinator  [phone]- Mobile [phone]- Toll Free Main Office

## 2018-04-25 ENCOUNTER — Other Ambulatory Visit: Payer: Self-pay

## 2018-04-25 ENCOUNTER — Emergency Department: Payer: Medicare Other

## 2018-04-25 ENCOUNTER — Inpatient Hospital Stay
Admission: EM | Admit: 2018-04-25 | Discharge: 2018-04-30 | DRG: 291 | Disposition: A | Discharge status: Skilled Nursing Facility | Payer: Medicare Other | Attending: Specialist | Admitting: Specialist

## 2018-04-25 DIAGNOSIS — I248 Other forms of acute ischemic heart disease: Secondary | ICD-10-CM | POA: Diagnosis not present

## 2018-04-25 DIAGNOSIS — D631 Anemia in chronic kidney disease: Secondary | ICD-10-CM | POA: Diagnosis present

## 2018-04-25 DIAGNOSIS — R251 Tremor, unspecified: Secondary | ICD-10-CM | POA: Diagnosis present

## 2018-04-25 DIAGNOSIS — Z7902 Long term (current) use of antithrombotics/antiplatelets: Secondary | ICD-10-CM

## 2018-04-25 DIAGNOSIS — Z803 Family history of malignant neoplasm of breast: Secondary | ICD-10-CM

## 2018-04-25 DIAGNOSIS — I509 Heart failure, unspecified: Secondary | ICD-10-CM

## 2018-04-25 DIAGNOSIS — R0602 Shortness of breath: Secondary | ICD-10-CM | POA: Diagnosis not present

## 2018-04-25 DIAGNOSIS — G4733 Obstructive sleep apnea (adult) (pediatric): Secondary | ICD-10-CM | POA: Diagnosis present

## 2018-04-25 DIAGNOSIS — Z8673 Personal history of transient ischemic attack (TIA), and cerebral infarction without residual deficits: Secondary | ICD-10-CM

## 2018-04-25 DIAGNOSIS — Z79899 Other long term (current) drug therapy: Secondary | ICD-10-CM

## 2018-04-25 DIAGNOSIS — I255 Ischemic cardiomyopathy: Secondary | ICD-10-CM | POA: Diagnosis present

## 2018-04-25 DIAGNOSIS — J9601 Acute respiratory failure with hypoxia: Secondary | ICD-10-CM | POA: Diagnosis not present

## 2018-04-25 DIAGNOSIS — R062 Wheezing: Secondary | ICD-10-CM | POA: Diagnosis not present

## 2018-04-25 DIAGNOSIS — M109 Gout, unspecified: Secondary | ICD-10-CM | POA: Diagnosis present

## 2018-04-25 DIAGNOSIS — J440 Chronic obstructive pulmonary disease with acute lower respiratory infection: Secondary | ICD-10-CM | POA: Diagnosis present

## 2018-04-25 DIAGNOSIS — Z888 Allergy status to other drugs, medicaments and biological substances status: Secondary | ICD-10-CM

## 2018-04-25 DIAGNOSIS — E1122 Type 2 diabetes mellitus with diabetic chronic kidney disease: Secondary | ICD-10-CM | POA: Diagnosis present

## 2018-04-25 DIAGNOSIS — I5033 Acute on chronic diastolic (congestive) heart failure: Secondary | ICD-10-CM | POA: Diagnosis not present

## 2018-04-25 DIAGNOSIS — Z7982 Long term (current) use of aspirin: Secondary | ICD-10-CM

## 2018-04-25 DIAGNOSIS — Z7951 Long term (current) use of inhaled steroids: Secondary | ICD-10-CM

## 2018-04-25 DIAGNOSIS — Z9049 Acquired absence of other specified parts of digestive tract: Secondary | ICD-10-CM

## 2018-04-25 DIAGNOSIS — I10 Essential (primary) hypertension: Secondary | ICD-10-CM | POA: Diagnosis not present

## 2018-04-25 DIAGNOSIS — J441 Chronic obstructive pulmonary disease with (acute) exacerbation: Secondary | ICD-10-CM | POA: Diagnosis not present

## 2018-04-25 DIAGNOSIS — M810 Age-related osteoporosis without current pathological fracture: Secondary | ICD-10-CM | POA: Diagnosis present

## 2018-04-25 DIAGNOSIS — D860 Sarcoidosis of lung: Secondary | ICD-10-CM | POA: Diagnosis present

## 2018-04-25 DIAGNOSIS — Z825 Family history of asthma and other chronic lower respiratory diseases: Secondary | ICD-10-CM

## 2018-04-25 DIAGNOSIS — Z91041 Radiographic dye allergy status: Secondary | ICD-10-CM

## 2018-04-25 DIAGNOSIS — R0902 Hypoxemia: Secondary | ICD-10-CM | POA: Diagnosis not present

## 2018-04-25 DIAGNOSIS — E785 Hyperlipidemia, unspecified: Secondary | ICD-10-CM | POA: Diagnosis present

## 2018-04-25 DIAGNOSIS — I2510 Atherosclerotic heart disease of native coronary artery without angina pectoris: Secondary | ICD-10-CM | POA: Diagnosis present

## 2018-04-25 DIAGNOSIS — Z90710 Acquired absence of both cervix and uterus: Secondary | ICD-10-CM

## 2018-04-25 DIAGNOSIS — Z89421 Acquired absence of other right toe(s): Secondary | ICD-10-CM

## 2018-04-25 DIAGNOSIS — Z6829 Body mass index (BMI) 29.0-29.9, adult: Secondary | ICD-10-CM

## 2018-04-25 DIAGNOSIS — I130 Hypertensive heart and chronic kidney disease with heart failure and stage 1 through stage 4 chronic kidney disease, or unspecified chronic kidney disease: Secondary | ICD-10-CM | POA: Diagnosis not present

## 2018-04-25 DIAGNOSIS — Z882 Allergy status to sulfonamides status: Secondary | ICD-10-CM

## 2018-04-25 DIAGNOSIS — I110 Hypertensive heart disease with heart failure: Secondary | ICD-10-CM | POA: Diagnosis not present

## 2018-04-25 DIAGNOSIS — Z881 Allergy status to other antibiotic agents status: Secondary | ICD-10-CM

## 2018-04-25 DIAGNOSIS — E1165 Type 2 diabetes mellitus with hyperglycemia: Secondary | ICD-10-CM | POA: Diagnosis present

## 2018-04-25 DIAGNOSIS — R0603 Acute respiratory distress: Secondary | ICD-10-CM | POA: Diagnosis not present

## 2018-04-25 DIAGNOSIS — E119 Type 2 diabetes mellitus without complications: Secondary | ICD-10-CM

## 2018-04-25 DIAGNOSIS — Z8249 Family history of ischemic heart disease and other diseases of the circulatory system: Secondary | ICD-10-CM

## 2018-04-25 DIAGNOSIS — Z794 Long term (current) use of insulin: Secondary | ICD-10-CM

## 2018-04-25 DIAGNOSIS — E1151 Type 2 diabetes mellitus with diabetic peripheral angiopathy without gangrene: Secondary | ICD-10-CM | POA: Diagnosis present

## 2018-04-25 DIAGNOSIS — F419 Anxiety disorder, unspecified: Secondary | ICD-10-CM | POA: Diagnosis present

## 2018-04-25 DIAGNOSIS — Z87442 Personal history of urinary calculi: Secondary | ICD-10-CM

## 2018-04-25 DIAGNOSIS — N183 Chronic kidney disease, stage 3 (moderate): Secondary | ICD-10-CM | POA: Diagnosis present

## 2018-04-25 DIAGNOSIS — E876 Hypokalemia: Secondary | ICD-10-CM | POA: Diagnosis present

## 2018-04-25 LAB — COMPREHENSIVE METABOLIC PANEL
ALK PHOS: 61 U/L (ref 38–126)
ALT: 15 U/L (ref 0–44)
AST: 22 U/L (ref 15–41)
Albumin: 3.5 g/dL (ref 3.5–5.0)
Anion gap: 8 (ref 5–15)
BUN: 15 mg/dL (ref 8–23)
CO2: 24 mmol/L (ref 22–32)
Calcium: 9.9 mg/dL (ref 8.9–10.3)
Chloride: 106 mmol/L (ref 98–111)
Creatinine, Ser: 1.18 mg/dL — ABNORMAL HIGH (ref 0.44–1.00)
GFR calc Af Amer: 52 mL/min — ABNORMAL LOW (ref >60)
GFR calc non Af Amer: 44 mL/min — ABNORMAL LOW (ref >60)
Glucose, Bld: 180 mg/dL — ABNORMAL HIGH (ref 70–99)
Potassium: 3.2 mmol/L — ABNORMAL LOW (ref 3.5–5.1)
Sodium: 138 mmol/L (ref 135–145)
Total Bilirubin: 0.6 mg/dL (ref 0.3–1.2)
Total Protein: 7.5 g/dL (ref 6.5–8.1)

## 2018-04-25 LAB — CBC WITH DIFFERENTIAL/PLATELET
Abs Immature Granulocytes: 0.05 10*3/uL (ref 0.00–0.07)
BASOS ABS: 0.1 10*3/uL (ref 0.0–0.1)
Basophils Relative: 1 %
EOS PCT: 2 %
Eosinophils Absolute: 0.2 10*3/uL (ref 0.0–0.5)
HCT: 33.2 % — ABNORMAL LOW (ref 36.0–46.0)
Hemoglobin: 10.4 g/dL — ABNORMAL LOW (ref 12.0–15.0)
Immature Granulocytes: 1 %
Lymphocytes Relative: 16 %
Lymphs Abs: 1.2 10*3/uL (ref 0.7–4.0)
MCH: 28.3 pg (ref 26.0–34.0)
MCHC: 31.3 g/dL (ref 30.0–36.0)
MCV: 90.2 fL (ref 80.0–100.0)
Monocytes Absolute: 0.7 10*3/uL (ref 0.1–1.0)
Monocytes Relative: 9 %
Neutro Abs: 5.6 10*3/uL (ref 1.7–7.7)
Neutrophils Relative %: 71 %
Platelets: 355 10*3/uL (ref 150–400)
RBC: 3.68 MIL/uL — ABNORMAL LOW (ref 3.87–5.11)
RDW: 15.4 % (ref 11.5–15.5)
WBC: 7.8 10*3/uL (ref 4.0–10.5)
nRBC: 0 % (ref 0.0–0.2)

## 2018-04-25 LAB — TROPONIN I
Troponin I: 0.03 ng/mL (ref <0.03)
Troponin I: 0.03 ng/mL (ref <0.03)
Troponin I: 0.03 ng/mL (ref <0.03)
Troponin I: 0.03 ng/mL (ref <0.03)

## 2018-04-25 LAB — BRAIN NATRIURETIC PEPTIDE: B NATRIURETIC PEPTIDE 5: 282 pg/mL — AB (ref 0.0–100.0)

## 2018-04-25 LAB — TSH: TSH: 0.758 u[IU]/mL (ref 0.350–4.500)

## 2018-04-25 LAB — GLUCOSE, CAPILLARY
Glucose-Capillary: 221 mg/dL — ABNORMAL HIGH (ref 70–99)
Glucose-Capillary: 248 mg/dL — ABNORMAL HIGH (ref 70–99)
Glucose-Capillary: 379 mg/dL — ABNORMAL HIGH (ref 70–99)

## 2018-04-25 MED ORDER — METHYLPREDNISOLONE SODIUM SUCC 125 MG IJ SOLR
125.0000 mg | Freq: Once | INTRAMUSCULAR | Status: AC
  Administered 2018-04-25: 125 mg via INTRAVENOUS
  Filled 2018-04-25: qty 2

## 2018-04-25 MED ORDER — DICYCLOMINE HCL 10 MG PO CAPS
10.0000 mg | ORAL_CAPSULE | Freq: Three times a day (TID) | ORAL | Status: DC
  Administered 2018-04-25 – 2018-04-30 (×23): 10 mg via ORAL
  Filled 2018-04-25 (×23): qty 1

## 2018-04-25 MED ORDER — GABAPENTIN 100 MG PO CAPS
100.0000 mg | ORAL_CAPSULE | Freq: Three times a day (TID) | ORAL | Status: DC
  Administered 2018-04-25 – 2018-04-26 (×4): 100 mg via ORAL
  Filled 2018-04-25 (×4): qty 1

## 2018-04-25 MED ORDER — INSULIN ASPART 100 UNIT/ML ~~LOC~~ SOLN: 0.0000 [IU] | Freq: Every day | SUBCUTANEOUS | Status: DC

## 2018-04-25 MED ORDER — AMLODIPINE BESYLATE 5 MG PO TABS
5.0000 mg | ORAL_TABLET | Freq: Every day | ORAL | Status: DC
  Administered 2018-04-25: 5 mg via ORAL
  Filled 2018-04-25: qty 1

## 2018-04-25 MED ORDER — GLUCERNA SHAKE PO LIQD
237.0000 mL | Freq: Two times a day (BID) | ORAL | Status: DC
  Administered 2018-04-25 – 2018-04-30 (×8): 237 mL via ORAL

## 2018-04-25 MED ORDER — MOMETASONE FURO-FORMOTEROL FUM 200-5 MCG/ACT IN AERO
2.0000 | INHALATION_SPRAY | Freq: Two times a day (BID) | RESPIRATORY_TRACT | Status: DC
  Administered 2018-04-25 – 2018-04-28 (×6): 2 via RESPIRATORY_TRACT
  Filled 2018-04-25: qty 8.8

## 2018-04-25 MED ORDER — NITROGLYCERIN 0.4 MG/SPRAY TL SOLN: 1.0000 | Status: DC | PRN

## 2018-04-25 MED ORDER — INSULIN ASPART 100 UNIT/ML ~~LOC~~ SOLN
0.0000 [IU] | Freq: Once | SUBCUTANEOUS | Status: AC
  Administered 2018-04-25: 2 [IU] via SUBCUTANEOUS
  Filled 2018-04-25: qty 1

## 2018-04-25 MED ORDER — TICAGRELOR 60 MG PO TABS
60.0000 mg | ORAL_TABLET | Freq: Two times a day (BID) | ORAL | Status: DC
  Administered 2018-04-25 – 2018-04-30 (×11): 60 mg via ORAL
  Filled 2018-04-25 (×12): qty 1

## 2018-04-25 MED ORDER — METHYLPREDNISOLONE SODIUM SUCC 125 MG IJ SOLR: 60.0000 mg | Freq: Four times a day (QID) | INTRAMUSCULAR | Status: DC

## 2018-04-25 MED ORDER — ONDANSETRON HCL 4 MG/2ML IJ SOLN: 4.0000 mg | Freq: Four times a day (QID) | INTRAMUSCULAR | Status: DC | PRN

## 2018-04-25 MED ORDER — FUROSEMIDE 10 MG/ML IJ SOLN
40.0000 mg | Freq: Once | INTRAMUSCULAR | Status: AC
  Administered 2018-04-25: 40 mg via INTRAVENOUS
  Filled 2018-04-25: qty 4

## 2018-04-25 MED ORDER — NITROGLYCERIN 0.4 MG SL SUBL: 0.4000 mg | SUBLINGUAL_TABLET | SUBLINGUAL | Status: DC | PRN

## 2018-04-25 MED ORDER — FLUTICASONE PROPIONATE 50 MCG/ACT NA SUSP
2.0000 | Freq: Every day | NASAL | Status: DC | PRN
  Filled 2018-04-25: qty 16

## 2018-04-25 MED ORDER — ACETAMINOPHEN 650 MG RE SUPP: 650.0000 mg | Freq: Four times a day (QID) | RECTAL | Status: DC | PRN

## 2018-04-25 MED ORDER — INSULIN ASPART 100 UNIT/ML ~~LOC~~ SOLN
0.0000 [IU] | Freq: Three times a day (TID) | SUBCUTANEOUS | Status: DC
  Administered 2018-04-25: 5 [IU] via SUBCUTANEOUS
  Administered 2018-04-25: 15 [IU] via SUBCUTANEOUS
  Administered 2018-04-25: 11 [IU] via SUBCUTANEOUS
  Filled 2018-04-25 (×3): qty 1

## 2018-04-25 MED ORDER — ISOSORBIDE MONONITRATE ER 30 MG PO TB24
30.0000 mg | ORAL_TABLET | Freq: Every day | ORAL | Status: DC
  Administered 2018-04-25 – 2018-04-28 (×4): 30 mg via ORAL
  Filled 2018-04-25 (×4): qty 1

## 2018-04-25 MED ORDER — METHYLPREDNISOLONE SODIUM SUCC 125 MG IJ SOLR
60.0000 mg | Freq: Two times a day (BID) | INTRAMUSCULAR | Status: DC
  Administered 2018-04-25 – 2018-04-26 (×2): 60 mg via INTRAVENOUS
  Filled 2018-04-25 (×2): qty 2

## 2018-04-25 MED ORDER — DIPHENOXYLATE-ATROPINE 2.5-0.025 MG PO TABS: 1.0000 | ORAL_TABLET | Freq: Four times a day (QID) | ORAL | Status: DC | PRN

## 2018-04-25 MED ORDER — HYDRALAZINE HCL 20 MG/ML IJ SOLN
10.0000 mg | Freq: Four times a day (QID) | INTRAMUSCULAR | Status: DC | PRN
  Administered 2018-04-30: 10 mg via INTRAVENOUS
  Filled 2018-04-25 (×2): qty 1

## 2018-04-25 MED ORDER — ASPIRIN EC 81 MG PO TBEC
81.0000 mg | DELAYED_RELEASE_TABLET | Freq: Every day | ORAL | Status: DC
  Administered 2018-04-25 – 2018-04-30 (×6): 81 mg via ORAL
  Filled 2018-04-25 (×6): qty 1

## 2018-04-25 MED ORDER — ACETAMINOPHEN 325 MG PO TABS
650.0000 mg | ORAL_TABLET | Freq: Four times a day (QID) | ORAL | Status: DC | PRN
  Administered 2018-04-25 – 2018-04-27 (×3): 650 mg via ORAL
  Filled 2018-04-25 (×3): qty 2

## 2018-04-25 MED ORDER — DOCUSATE SODIUM 100 MG PO CAPS
100.0000 mg | ORAL_CAPSULE | Freq: Two times a day (BID) | ORAL | Status: DC
  Administered 2018-04-25 – 2018-04-30 (×11): 100 mg via ORAL
  Filled 2018-04-25 (×11): qty 1

## 2018-04-25 MED ORDER — FUROSEMIDE 10 MG/ML IJ SOLN
40.0000 mg | Freq: Two times a day (BID) | INTRAMUSCULAR | Status: DC
  Administered 2018-04-25 – 2018-04-26 (×3): 40 mg via INTRAVENOUS
  Filled 2018-04-25 (×3): qty 4

## 2018-04-25 MED ORDER — HYDRALAZINE HCL 10 MG PO TABS
10.0000 mg | ORAL_TABLET | Freq: Three times a day (TID) | ORAL | Status: DC
  Administered 2018-04-25: 10 mg via ORAL
  Filled 2018-04-25 (×3): qty 1

## 2018-04-25 MED ORDER — LABETALOL HCL 5 MG/ML IV SOLN
10.0000 mg | INTRAVENOUS | Status: AC
  Administered 2018-04-25: 10 mg via INTRAVENOUS
  Filled 2018-04-25: qty 4

## 2018-04-25 MED ORDER — GUAIFENESIN-DM 100-10 MG/5ML PO SYRP
5.0000 mL | ORAL_SOLUTION | ORAL | Status: DC | PRN
  Administered 2018-04-25 – 2018-04-30 (×6): 5 mL via ORAL
  Filled 2018-04-25 (×7): qty 5

## 2018-04-25 MED ORDER — INSULIN GLARGINE 100 UNIT/ML ~~LOC~~ SOLN
24.0000 [IU] | Freq: Every day | SUBCUTANEOUS | Status: DC
  Administered 2018-04-25: 24 [IU] via SUBCUTANEOUS
  Filled 2018-04-25 (×2): qty 0.24

## 2018-04-25 MED ORDER — ALLOPURINOL 100 MG PO TABS
100.0000 mg | ORAL_TABLET | Freq: Every day | ORAL | Status: DC
  Administered 2018-04-25 – 2018-04-30 (×6): 100 mg via ORAL
  Filled 2018-04-25 (×6): qty 1

## 2018-04-25 MED ORDER — ROSUVASTATIN CALCIUM 10 MG PO TABS
20.0000 mg | ORAL_TABLET | Freq: Every day | ORAL | Status: DC
  Administered 2018-04-25 – 2018-04-30 (×6): 20 mg via ORAL
  Filled 2018-04-25 (×6): qty 2

## 2018-04-25 MED ORDER — NITROGLYCERIN 2 % TD OINT
0.5000 [in_us] | TOPICAL_OINTMENT | TRANSDERMAL | Status: AC
  Administered 2018-04-25: 0.5 [in_us] via TOPICAL
  Filled 2018-04-25: qty 1

## 2018-04-25 MED ORDER — ONDANSETRON HCL 4 MG PO TABS: 4.0000 mg | ORAL_TABLET | Freq: Four times a day (QID) | ORAL | Status: DC | PRN

## 2018-04-25 MED ORDER — INSULIN ASPART 100 UNIT/ML ~~LOC~~ SOLN
0.0000 [IU] | Freq: Three times a day (TID) | SUBCUTANEOUS | Status: DC
  Administered 2018-04-25: 11 [IU] via SUBCUTANEOUS
  Administered 2018-04-26: 15 [IU] via SUBCUTANEOUS
  Administered 2018-04-26: 11 [IU] via SUBCUTANEOUS
  Administered 2018-04-26: 8 [IU] via SUBCUTANEOUS
  Administered 2018-04-27 (×3): 15 [IU] via SUBCUTANEOUS
  Administered 2018-04-28: 3 [IU] via SUBCUTANEOUS
  Administered 2018-04-28: 5 [IU] via SUBCUTANEOUS
  Administered 2018-04-28: 15 [IU] via SUBCUTANEOUS
  Administered 2018-04-28 – 2018-04-29 (×2): 11 [IU] via SUBCUTANEOUS
  Administered 2018-04-29: 3 [IU] via SUBCUTANEOUS
  Administered 2018-04-29: 8 [IU] via SUBCUTANEOUS
  Administered 2018-04-30: 5 [IU] via SUBCUTANEOUS
  Administered 2018-04-30: 8 [IU] via SUBCUTANEOUS
  Filled 2018-04-25 (×16): qty 1

## 2018-04-25 MED ORDER — IPRATROPIUM-ALBUTEROL 0.5-2.5 (3) MG/3ML IN SOLN
3.0000 mL | Freq: Four times a day (QID) | RESPIRATORY_TRACT | Status: DC
  Administered 2018-04-25 – 2018-04-30 (×17): 3 mL via RESPIRATORY_TRACT
  Filled 2018-04-25 (×17): qty 3

## 2018-04-25 MED ORDER — AMLODIPINE BESYLATE 10 MG PO TABS
10.0000 mg | ORAL_TABLET | Freq: Every day | ORAL | Status: DC
  Administered 2018-04-26 – 2018-04-30 (×5): 10 mg via ORAL
  Filled 2018-04-25 (×5): qty 1

## 2018-04-25 MED ORDER — HEPARIN SODIUM (PORCINE) 5000 UNIT/ML IJ SOLN
5000.0000 [IU] | Freq: Three times a day (TID) | INTRAMUSCULAR | Status: DC
  Administered 2018-04-25 – 2018-04-30 (×15): 5000 [IU] via SUBCUTANEOUS
  Filled 2018-04-25 (×15): qty 1

## 2018-04-25 MED ORDER — ALPRAZOLAM 0.25 MG PO TABS
0.2500 mg | ORAL_TABLET | Freq: Every day | ORAL | Status: DC
  Administered 2018-04-25 – 2018-04-30 (×6): 0.25 mg via ORAL
  Filled 2018-04-25 (×6): qty 1

## 2018-04-25 NOTE — ED Provider Notes (Signed)
[facility] Emergency Department Provider Note   ____________________________________________   First MD Initiated Contact with Patient 04/25/18 0120     (approximate)  I have reviewed the triage vital signs and the nursing notes.   HISTORY  Chief Complaint Shortness of Breath    HPI Pamela Collier is a 77 y.o. female brought to the ED from home via EMS with a chief complaint of shortness of breath.  Patient has a history of COPD, CHF, ischemic cardiomyopathy not on home oxygen who has had progressive shortness of breath over the past 2 days.  Symptoms associated with chest tightness.  Also notes nonproductive cough.  Denies associated fever, chills, abdominal pain, nausea, vomiting or diarrhea.  Denies recent travel or trauma.   Past Medical History:  Diagnosis Date  . Acute respiratory failure ([place]) 11/21/2014  . Anxiety   . Arthritis 08/05/2013  . Atherosclerotic peripheral vascular disease with gangrene [facility]) august 2012  . Cardiomyopathy, ischemic    a. EF 35 to 40% by echo in 2013 b. EF improved to 50-55% by echo in 04/2015; 11/2016 Echo: EF 55-60%, Gr1 DD, mildly dil LA, nl RV fxn.  . Cerebral infarct ([place]) 08/17/2013  . Chronic diastolic CHF (congestive heart failure) (HCC)    a. EF 50-55% by echo in 04/2015; b. 11/2016 Echo: EF 55-60%, Gr1 DD.  [name] COPD (chronic obstructive pulmonary disease) ([place])   . Coronary artery disease, occlusive    a. Previous PCI to the LAD, LCx, and RCA in 2010, 2011, 2013, and 2016. All performed in [place]; b. 11/2016 Lexiscan MV: EF 43%, no ischemia->low risk.  . 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 Diabetic diarrhea ([place]) 10/03/2014  . DM type 2, uncontrolled, with renal complications ([place]) 10/18/319  . Hepatic steatosis    by CT abd pelvis  . History of kidney stones    at a younger age  . Hyperlipidemia LDL goal <100 02/23/2014  . Hypertensive heart disease   . Osteoporosis, post-menopausal   . 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 Pill esophagitis   . Pleural effusion 10/25/2012   10/2012 CT chest >> small to moderate R lung effusion>> chylothorax, 100% lymphs 10/2013 thoracentesis> cytology negative, WBC 1471, > 90% "small lymphs" 01/2014 CT chest> near complete resolution of pleural effusion, stable lymphadenopathy 08/2014 CT chest [place] ([facility]): small right sided effusion decreased in size, stable mediastinal lymphadenopathy 1.0cm largest, 20m . 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 Sleep apnea   . Stage III chronic kidney disease ([facility]     Patient Active Problem List   Diagnosis Date Noted  . Acute on chronic diastolic heart failure ([place] 04/25/2018  . Chronic heart failure with preserved ejection fraction ([place] 04/22/2018  . Atherosclerosis of both carotid arteries 04/18/2018  . Near syncope 03/19/2018  . Functional diarrhea 03/13/2018  . Vitamin a deficiency, unspecified 02/22/2018  . Hypercalcemia 02/20/2018  . Environmental and seasonal allergies 01/15/2018  . Post-nasal drip 01/15/2018  . Restless legs 10/17/2017  . Leg pain 10/15/2017  . Normocytic anemia, not due to blood loss 09/27/2017  . Melena 09/25/2017  .  Cognitive decline 08/16/2017  . Polypharmacy 07/28/2017  . Pneumonia 05/26/2017  . Pill esophagitis 05/23/2017  . CKD (chronic kidney disease) stage 4, GFR 15-29 ml/min (HCC) 11/15/2016  . History of fall 11/12/2016  . Osteoarthritis 11/10/2016  . Right arm pain 10/15/2016  . Cervical stenosis of spinal canal 10/10/2016  . Cervical radiculopathy at C7 07/02/2016  . Epigastric hernia 04/01/2016  . Umbilical pain 33/35/4562  .  Coronary artery disease   . Type 2 DM with CKD stage 3 and hypertension ([place])   . Chronic renal insufficiency   . Hypertensive heart disease   . OSA on CPAP 07/07/2015  . Hospital discharge follow-up 07/07/2015  . Chest pain 06/29/2015  . Pleural effusion, right   . Chronic diastolic CHF (congestive heart failure) ([place]) 04/18/2015  . Diabetic neuropathy ([place]) 02/21/2015  . Benign neoplasm of sigmoid colon   . Low back pain with radiation 04/04/2014  . Essential hypertension 04/04/2014  . Hyperlipidemia LDL goal <70 02/23/2014  . Long-term use of high-risk medication 02/23/2014  . Other long term (current) drug therapy 02/23/2014  . Dermatophytic onychia 08/17/2013  . Stable angina ([place]) 08/17/2013  . Cerebral infarct ([place]) 08/17/2013  . Bony exostosis 08/17/2013  . Obesity, unspecified 08/17/2013  . Arthritis 08/05/2013  . Unsteadiness on feet 03/24/2013  . History of colonic polyps 02/20/2013  . Sarcoidosis ([place]) 12/07/2012  . Dysphagia 12/07/2012  . Pulmonary nodule seen on imaging study 05/20/2012  . 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 Diabetic nephropathy associated with type 2 diabetes mellitus ([place]) 02/29/2012  . Cardiomyopathy, ischemic   . Hepatic steatosis   . Osteoporosis, post-menopausal   . Peripheral vascular disease due to secondary diabetes mellitus ([place])   . Double vessel coronary artery disease 12/14/2011    Past Surgical History:  Procedure Laterality Date  . ABDOMINAL HYSTERECTOMY     at ge 39. secondary to bleeding/partial  . ANTERIOR CERVICAL DECOMP/DISCECTOMY FUSION N/A 10/10/2016   Procedure: Anterior Cervical Discectomy Fusion - Cervical four4- five - Cervical five-Cervical six - Cervical six-Cervical seven;  Surgeon: Earnie Larsson, MD;  Location: [place];  Service: Neurosurgery;  Laterality: N/A;  . BREAST CYST ASPIRATION Right   . CARPAL TUNNEL RELEASE Bilateral   . CHOLECYSTECTOMY     in [place]   . COLONOSCOPY WITH PROPOFOL N/A 01/09/2015    Procedure: COLONOSCOPY WITH PROPOFOL;  Surgeon: Lucilla Lame, MD;  Location: [facility] ENDOSCOPY;  Service: Endoscopy;  Laterality: N/A;  . CORONARY ANGIOPLASTY WITH STENT PLACEMENT     [place]; [facility]  . EYE SURGERY     bil cataracts  . HERNIA REPAIR     umbilical/ Dr Pat Patrick  . OOPHORECTOMY    . PTCA  August 2012   Right Posterior tibial artery , [place]  . TOE AMPUTATION  Sept 2012   Right 2nd toe, [place]    Prior to Admission medications   Medication Sig Start Date End Date Taking? Authorizing Provider  acetaminophen (TYLENOL) 500 MG tablet Take 1,000 mg by mouth every 6 (six) hours as needed for mild pain or headache.    Yes [provider]  allopurinol (ZYLOPRIM) 100 MG tablet Take 1 tablet (100 mg total) by mouth daily. 03/22/18 03/22/19 Yes Wieting, Richard, MD  ALPRAZolam [name]) 0.25 MG tablet Take 1 tablet (0.25 mg total) by mouth daily. 03/22/18  Yes Wieting, Richard, MD  amLODipine (NORVASC) 5 MG tablet Take 1 tablet (5 mg total) by mouth daily. 04/16/18  Yes Tullo,  Aris Everts, MD  aspirin EC 81 MG tablet Take 81 mg by mouth daily.   Yes [provider]  dicyclomine (BENTYL) 10 MG capsule Take 1 capsule (10 mg total) by mouth 4 (four) times daily -  before meals and at bedtime. 03/10/18  Yes [name], MD  gabapentin (NEURONTIN) 100 MG capsule Take 1 capsule (100 mg total) by mouth 3 (three) times daily. 04/16/18  Yes [name], MD  insulin aspart protamine- aspart (NOVOLOG MIX 70/30) (70-30) 100 UNIT/ML injection Inject 0.3 mLs (30 Units total) into the skin 2 (two) times daily with a meal. 03/22/18  Yes Wieting, Richard, MD  isosorbide mononitrate (IMDUR) 30 MG 24 hr tablet Take 1 tablet (30 mg total) by mouth daily. 03/03/18  Yes Gollan, Kathlene November, MD  mometasone-formoterol (DULERA) 200-5 MCG/ACT AERO Inhale 2 puffs into the lungs 2 (two) times daily. 02/01/18  Yes [name], MD  rosuvastatin (CRESTOR) 20 MG tablet Take 1  tablet (20 mg total) by mouth daily. 04/16/18  Yes [name], MD  ticagrelor (BRILINTA) 60 MG TABS tablet Take 1 tablet (60 mg total) by mouth 2 (two) times daily. 04/22/18  Yes Minna Merritts, MD  B-D UF III MINI PEN NEEDLES 31G X 5 MM MISC USE THREE TIMES A DAY 02/18/18   [name], MD  blood glucose meter kit and supplies Dispense based on patient and insurance preference. Use up to three times daily as directed. (FOR ICD-10 E11.21) 02/18/18   [name], MD  diphenoxylate-atropine (LOMOTIL) 2.5-0.025 MG tablet Take 1 tablet by mouth 4 (four) times daily as needed for diarrhea or loose stools. 03/22/18   Loletha Grayer, MD  feeding supplement, GLUCERNA SHAKE, (GLUCERNA SHAKE) LIQD Take 237 mLs by mouth 2 (two) times daily between meals. 05/27/17   Gouru, Illene Silver, MD  fluticasone (FLONASE) 50 MCG/ACT nasal spray Place 2 sprays into both nostrils daily as needed for rhinitis. 01/20/18   [name], MD  glucose blood (ACCU-CHEK AVIVA PLUS) test strip USE TO CHECK GLUCOSE THREE TIMES DAILY 02/16/18   [name], MD  glucose blood test strip Use to check blood sugars 2 times daily 02/16/18   [name], MD  nitroGLYCERIN (NITROLINGUAL) 0.4 MG/SPRAY spray Place 1 spray under the tongue every 5 (five) minutes x 3 doses as needed for chest pain. 01/14/17   [name], MD    Allergies Cephalexin; Contrast media [iodinated diagnostic agents]; Doxycycline; Gabapentin; and Sulfa antibiotics  Family History  Problem Relation Age of Onset  . Heart disease Father   . Heart attack Father   . Heart disease Sister   . Heart attack Sister   . Heart disease Brother   . Heart attack Brother   . Tremor Mother   . Asthma Grandchild   . Breast cancer Maternal Aunt     Social History Social History   Tobacco Use  . Smoking status: Never Smoker  . Smokeless tobacco: Never Used  Substance Use Topics  . Alcohol use: No    Alcohol/week: 0.0 standard drinks  . Drug  use: No    Review of Systems  Constitutional: No fever/chills Eyes: No visual changes. ENT: No sore throat. Cardiovascular: Positive for chest pain. Respiratory: Positive for cough and shortness of breath. Gastrointestinal: No abdominal pain.  No nausea, no vomiting.  No diarrhea.  No constipation. Genitourinary: Negative for dysuria. Musculoskeletal: Negative for back pain. Skin: Negative for rash. Neurological: Negative for headaches, focal  weakness or numbness.   ____________________________________________   PHYSICAL EXAM:  VITAL SIGNS: ED Triage Vitals [04/25/18 0117]  Enc Vitals Group     BP      Pulse      Resp      Temp      Temp src      SpO2 96 %     Weight      Height      Head Circumference      Peak Flow      Pain Score      Pain Loc      Pain Edu?      Excl. in [place]?     Constitutional: Alert and oriented. Well appearing and in mild to moderate acute distress.  Tremors. Eyes: Conjunctivae are normal. PERRL. EOMI. Head: Atraumatic. Nose: No congestion/rhinnorhea. Mouth/Throat: Mucous membranes are moist.  Oropharynx non-erythematous. Neck: No stridor.   Cardiovascular: Normal rate, regular rhythm. Grossly normal heart sounds.  Good peripheral circulation. Respiratory: Increased respiratory effort.  No retractions. Lungs diminished bibasilarly. Gastrointestinal: Soft and nontender. No distention. No abdominal bruits. No CVA tenderness. Musculoskeletal: No lower extremity tenderness nor edema.  No joint effusions. Neurologic:  Normal speech and language. No gross focal neurologic deficits are appreciated.  Skin:  Skin is warm, dry and intact. No rash noted. Psychiatric: Mood and affect are normal. Speech and behavior are normal.  ____________________________________________   LABS (all labs ordered are listed, but only abnormal results are displayed)  Labs Reviewed  CBC WITH DIFFERENTIAL/PLATELET - Abnormal; Notable for the following components:        Result Value   RBC 3.68 (*)    Hemoglobin 10.4 (*)    HCT 33.2 (*)    All other components within normal limits  COMPREHENSIVE METABOLIC PANEL - Abnormal; Notable for the following components:   Potassium 3.2 (*)    Glucose, Bld 180 (*)    Creatinine, Ser 1.18 (*)    GFR calc non Af Amer 44 (*)    GFR calc Af Amer 52 (*)    All other components within normal limits  BRAIN NATRIURETIC PEPTIDE - Abnormal; Notable for the following components:   B Natriuretic Peptide 282.0 (*)    All other components within normal limits  TROPONIN I - Abnormal; Notable for the following components:   Troponin I 0.03 (*)    All other components within normal limits  GLUCOSE, CAPILLARY - Abnormal; Notable for the following components:   Glucose-Capillary 221 (*)    All other components within normal limits  TSH  TROPONIN I  TROPONIN I  TROPONIN I   ____________________________________________  EKG  ED ECG REPORT I, Jazzmin Newbold J, the attending physician, personally viewed and interpreted this ECG.   Date: 04/25/2018  EKG Time: 0124  Rate: 84  Rhythm: normal EKG, normal sinus rhythm  Axis: LAD  Intervals:nonspecific intraventricular conduction delay  ST&T Change: Nonspecific  ____________________________________________  RADIOLOGY  ED MD interpretation: CHF  Official radiology report(s): Dg Chest Port 1 View  Result Date: 04/25/2018 CLINICAL DATA:  Shortness of breath. EXAM: PORTABLE CHEST 1 VIEW COMPARISON:  Chest radiograph March 19, 2018 FINDINGS: Cardiac silhouette is mildly enlarged. Calcified aortic arch. Low inspiratory examination with increased interstitial prominence. New bandlike density RIGHT lung base with small RIGHT pleural effusion tracking to major fissure. Calcified mediastinal lymph nodes. No pneumothorax. ACDF. Old LEFT rib fractures. IMPRESSION: 1. Cardiomegaly. Interstitial prominence suggest pulmonary edema superimposed on chronic interstitial changes. 2.  Small RIGHT pleural  effusion.  RIGHT lung base atelectasis. Electronically Signed   By: Elon Alas M.D.   On: 04/25/2018 02:16    ____________________________________________   PROCEDURES  Procedure(s) performed: None  Procedures  Critical Care performed: Yes, see critical care note(s)  CRITICAL CARE Performed by: Paulette Blanch   Total critical care time: 45 minutes  Critical care time was exclusive of separately billable procedures and treating other patients.  Critical care was necessary to treat or prevent imminent or life-threatening deterioration.  Critical care was time spent personally by me on the following activities: development of treatment plan with patient and/or surrogate as well as nursing, discussions with consultants, evaluation of patient's response to treatment, examination of patient, obtaining history from patient or surrogate, ordering and performing treatments and interventions, ordering and review of laboratory studies, ordering and review of radiographic studies, pulse oximetry and re-evaluation of patient's condition. ____________________________________________   INITIAL IMPRESSION / ASSESSMENT AND PLAN / ED COURSE  As part of my medical decision making, I reviewed the following data within the [place] notes reviewed and incorporated, Labs reviewed, EKG interpreted, Old chart reviewed, Radiograph reviewed, Discussed with admitting physician and Notes from prior ED visits   76 year old female with COPD, CHF who presents with respiratory distress. Differential includes, but is not limited to, viral syndrome, bronchitis including COPD exacerbation, pneumonia, reactive airway disease including asthma, CHF including exacerbation with or without pulmonary/interstitial edema, pneumothorax, ACS, thoracic trauma, and pulmonary embolism.  Patient performed 3 nebulizer treatments prior to arrival.  EMS administered 1 nitroglycerin  spray.  Will add 125 mg IV Solu-Medrol, obtain screening lab work, chest x-ray and reassess.  Clinical Course as of Apr 25 749  Sun Apr 25, 2018  0234 From most recent cardiology note 3 days ago, looks like Lasix had been stopped.  Will administer 1 dose for diuresis.  Will discuss with hospitalist to evaluate patient in the emergency department for admission.   [JS]    Clinical Course User Index [JS] Paulette Blanch, MD     ____________________________________________   FINAL CLINICAL IMPRESSION(S) / ED DIAGNOSES  Final diagnoses:  Respiratory distress  COPD exacerbation ([place])  Acute on chronic congestive heart failure, unspecified heart failure type [facility])     ED Discharge Orders    None       Note:  This document was prepared using Dragon voice recognition software and may include unintentional dictation errors.    Paulette Blanch, MD 04/25/18 [phone]

## 2018-04-25 NOTE — ED Notes (Signed)
EKG completed

## 2018-04-25 NOTE — Progress Notes (Signed)
Patient was drinking 3+L per day.  RN educated patient about fluid restrictions.  RN gave her the heart failure folder.  Patient sometimes forgets stuff.  Niece would like to be told about anything important.    Pamela Collier [phone]  Phillis Knack, RN

## 2018-04-25 NOTE — Care Management Obs Status (Signed)
[place] NOTIFICATION   Patient Details  [name] MRN: [id number] Date of Birth: 11/11/1940   Medicare Observation Status Notification Given:  Yes    Haasini Patnaude A Ifeoluwa Beller, RN 04/25/2018, 11:30 AM

## 2018-04-25 NOTE — Care Management Note (Signed)
Case Management Note  Patient Details  [name] MRN: [id number] Date of Birth: 06-Feb-1941  Subjective/Objective:  Patient admitted to [facility] under observation status for acute on chronic diastolic heart failure.[name] RNCM consulted on patient to provide MOON letter and complete assessment. Patient currently lives at home with spouse and has family who checks on her. Her children are out of state in [place] but has supports from her niece and neighbors. She was discharged recently to [facility] and then return home with home health through [facility]. Active with [facility]. Notified [place] from [place] of admission. Patient requiring acute O2. Uses CPAP at night at home. PCP Tullo.                    Action/Plan: RNCM to continue to follow for any needs.   Expected Discharge Date:                  Expected Discharge Plan:     In-House Referral:     Discharge planning Services     Post Acute Care Choice:    Choice offered to:     DME Arranged:    DME Agency:     HH Arranged:    HH Agency:     Status of Service:     If discussed at [employer], dates discussed:    Additional Comments:  Latanya Maudlin, RN 04/25/2018, 11:41 AM

## 2018-04-25 NOTE — Progress Notes (Signed)
[place] at [facility]                                                                                                                                                                                  Patient Demographics   Pamela Collier, is a 77 y.o. female, DOB - 09-Dec-1940, LMB:[id number]  Admit date - 04/25/2018   Admitting Physician Harrie Foreman, MD  Outpatient Primary MD for the patient is [name], MD   LOS - 0  Subjective: Patient continues to complain of shortness of breath has had good urine output this admission    Review of Systems:   CONSTITUTIONAL: No documented fever. No fatigue, weakness. No weight gain, no weight loss.  EYES: No blurry or double vision.  ENT: No tinnitus. No postnasal drip. No redness of the oropharynx.  RESPIRATORY: No cough, no wheeze, no hemoptysis.  Positive dyspnea.  CARDIOVASCULAR: No chest pain. No orthopnea. No palpitations. No syncope.  GASTROINTESTINAL: No nausea, no vomiting or diarrhea. No abdominal pain. No melena or hematochezia.  GENITOURINARY: No dysuria or hematuria.  ENDOCRINE: No polyuria or nocturia. No heat or cold intolerance.  HEMATOLOGY: No anemia. No bruising. No bleeding.  INTEGUMENTARY: No rashes. No lesions.  MUSCULOSKELETAL: No arthritis. No swelling. No gout.  NEUROLOGIC: No numbness, tingling, or ataxia. No seizure-type activity.  PSYCHIATRIC: No anxiety. No insomnia. No ADD.    Vitals:   Vitals:   04/25/18 0557 04/25/18 0646 04/25/18 0807 04/25/18 0810  BP: (!) 172/67 (!) 156/61 (!) 163/57 (!) 164/89  Pulse: 60 66 67 96  Resp: 15 20    Temp:  97.8 F (36.6 C) 98.2 F (36.8 C) 98.2 F (36.8 C)  TempSrc:  Oral Oral Oral  SpO2: 98% 98% 100% 95%  Weight:  84.5 kg    Height:  5\' 7"  (1.702 m)      Wt Readings from Last 3 Encounters:  04/25/18 84.5 kg  04/23/18 84.8 kg  04/22/18 85.5 kg     Intake/Output Summary (Last 24 hours) at 04/25/2018 1513 Last data  filed at 04/25/2018 0925 Gross per 24 hour  Intake -  Output 850 ml  Net -850 ml    Physical Exam:   GENERAL: Pleasant-appearing in no apparent distress.  HEAD, EYES, EARS, NOSE AND THROAT: Atraumatic, normocephalic. Extraocular muscles are intact. Pupils equal and reactive to light. Sclerae anicteric. No conjunctival injection. No oro-pharyngeal erythema.  NECK: Supple. There is no jugular venous distention. No bruits, no lymphadenopathy, no thyromegaly.  HEART: Regular rate and rhythm,. No murmurs, no rubs, no clicks.  LUNGS: Bilateral crackles throughout both lung ABDOMEN: Soft, flat, nontender, nondistended. Has good bowel  sounds. No hepatosplenomegaly appreciated.  EXTREMITIES: No evidence of any cyanosis, clubbing, or peripheral edema.  +2 pedal and radial pulses bilaterally.  NEUROLOGIC: The patient is alert, awake, and oriented x3 with no focal motor or sensory deficits appreciated bilaterally.  SKIN: Moist and warm with no rashes appreciated.  Psych: Not anxious, depressed LN: No inguinal LN enlargement    Antibiotics   Anti-infectives (From admission, onward)   None      Medications   Scheduled Meds: . allopurinol  100 mg Oral Daily  . ALPRAZolam  0.25 mg Oral Daily  . amLODipine  5 mg Oral Daily  . aspirin EC  81 mg Oral Daily  . dicyclomine  10 mg Oral TID AC & HS  . docusate sodium  100 mg Oral BID  . feeding supplement (GLUCERNA SHAKE)  237 mL Oral BID BM  . gabapentin  100 mg Oral TID  . heparin  5,000 Units Subcutaneous Q8H  . hydrALAZINE  10 mg Oral Q8H  . insulin aspart  0-15 Units Subcutaneous TID WC  . insulin glargine  24 Units Subcutaneous QHS  . isosorbide mononitrate  30 mg Oral Daily  . mometasone-formoterol  2 puff Inhalation BID  . rosuvastatin  20 mg Oral Daily  . ticagrelor  60 mg Oral BID   Continuous Infusions: PRN Meds:.acetaminophen **OR** acetaminophen, diphenoxylate-atropine, fluticasone, nitroGLYCERIN, ondansetron **OR**  ondansetron (ZOFRAN) IV   Data Review:   Micro Results No results found for this or any previous visit (from the past 240 hour(s)).  Radiology Reports Dg Chest Port 1 View  Result Date: 04/25/2018 CLINICAL DATA:  Shortness of breath. EXAM: PORTABLE CHEST 1 VIEW COMPARISON:  Chest radiograph March 19, 2018 FINDINGS: Cardiac silhouette is mildly enlarged. Calcified aortic arch. Low inspiratory examination with increased interstitial prominence. New bandlike density RIGHT lung base with small RIGHT pleural effusion tracking to major fissure. Calcified mediastinal lymph nodes. No pneumothorax. ACDF. Old LEFT rib fractures. IMPRESSION: 1. Cardiomegaly. Interstitial prominence suggest pulmonary edema superimposed on chronic interstitial changes. 2. Small RIGHT pleural effusion.  RIGHT lung base atelectasis. Electronically Signed   By: Elon Alas M.D.   On: 04/25/2018 02:16     CBC Recent Labs  Lab 04/25/18 0130  WBC 7.8  HGB 10.4*  HCT 33.2*  PLT 355  MCV 90.2  MCH 28.3  MCHC 31.3  RDW 15.4  LYMPHSABS 1.2  MONOABS 0.7  EOSABS 0.2  BASOSABS 0.1    Chemistries  Recent Labs  Lab 04/25/18 0130  NA 138  K 3.2*  CL 106  CO2 24  GLUCOSE 180*  BUN 15  CREATININE 1.18*  CALCIUM 9.9  AST 22  ALT 15  ALKPHOS 61  BILITOT 0.6   ------------------------------------------------------------------------------------------------------------------ estimated creatinine clearance is 44.6 mL/min (A) (by C-G formula based on SCr of 1.18 mg/dL (H)). ------------------------------------------------------------------------------------------------------------------ No results for input(s): HGBA1C in the last 72 hours. ------------------------------------------------------------------------------------------------------------------ No results for input(s): CHOL, HDL, LDLCALC, TRIG, CHOLHDL, LDLDIRECT in the last 72  hours. ------------------------------------------------------------------------------------------------------------------ Recent Labs    04/25/18 0743  TSH 0.758   ------------------------------------------------------------------------------------------------------------------ No results for input(s): VITAMINB12, FOLATE, FERRITIN, TIBC, IRON, RETICCTPCT in the last 72 hours.  Coagulation profile No results for input(s): INR, PROTIME in the last 168 hours.  No results for input(s): DDIMER in the last 72 hours.  Cardiac Enzymes Recent Labs  Lab 04/25/18 0130 04/25/18 0743 04/25/18 1325  TROPONINI 0.03* 0.03* 0.03*   ------------------------------------------------------------------------------------------------------------------ Invalid input(s): POCBNP    Assessment & Plan   This  is a 77 year old female admitted for CHF exacerbation. 1.  CHF: Acute on chronic; diastolic.    Continue IV Lasix 2.  COPD: With mild exacerbation as there is some wheezing.  Contributes to respiratory distress.    I will add Solu-Medrol and add nebs 3.  CAD: Stable; continue aspirin and Brilinta.  Continue Imdur 4.  Hypertension: Uncontrolled; I have applied Nitropaste to the chest in the emergency department.  Continue amlodipine.  Add hydralazine.  PRN 5.  Diabetes mellitus type 2: Continue basal insulin as well as sliding scale 6.  Hyperlipidemia: Continue statin therapy 7.  DVT prophylaxis: Heparin 8.  GI prophylaxis: None      Code Status Orders  (From admission, onward)         Start     Ordered   04/25/18 0647  Full code  Continuous     04/25/18 0646        Code Status History    Date Active Date Inactive Code Status Order ID Comments User Context   06/04/2017 0151 06/04/2017 1944 Full Code [id number]  Lance Coon, MD Inpatient   05/24/2017 0129 05/26/2017 1926 Full Code [id number]  Lance Coon, MD Inpatient   11/14/2016 2249 11/15/2016 [place] Full Code [id number]  [place], [place],  DO ED   10/10/2016 1325 10/13/2016 2020 Full Code [id number]  Earnie Larsson, MD Inpatient   06/11/2016 2142 06/12/2016 1923 Full Code [id number]  [place], [place], DO ED   01/02/2016 0524 01/02/2016 0914 Full Code [id number]  Saundra Shelling, MD Inpatient   06/26/2015 0736 06/29/2015 1954 Full Code [id number]  Demetrios Loll, MD ED   04/20/2015 0118 04/22/2015 1918 Full Code [id number]  Lance Coon, MD Inpatient           Consults none  DVT Prophylaxis  Lovenox   Lab Results  Component Value Date   PLT 355 04/25/2018     Time Spent in minutes   45 minutes spent, 12:30 PM to 1:30 PM  greater than 50% of time spent in care coordination and counseling patient regarding the condition and plan of care.   Dustin Flock M.D on 04/25/2018 at 3:13 PM  Between 7am to 6pm - Pager - [phone]  After 6pm go to [email or web address] - Proofreader  [employer]   Office  [phone]

## 2018-04-25 NOTE — H&P (Signed)
Pamela Collier is an 77 y.o. female.   Chief Complaint: Shortness of breath HPI: The patient with past medical history of diastolic heart failure, hypertension, CAD, stroke, COPD and diabetes presents to the emergency department due to shortness of breath.  The patient states that she had been walking to the restroom when she became acutely short of breath.  She denies chest pain.  Chest x-ray showed increased interstitial markings suggestive of pulmonary edema as well as a right pleural effusion and atelectasis at the right base.  The patient was given Lasix 40 mg IV and had good urine output which improved her dyspnea some.  However, respiratory rate remained elevated prompted the emergency department staff to call the hospitalist service for further management.  Past Medical History:  Diagnosis Date  . Acute respiratory failure ([place]) 11/21/2014  . Anxiety   . Arthritis 08/05/2013  . Atherosclerotic peripheral vascular disease with gangrene [facility]) august 2012  . Cardiomyopathy, ischemic    a. EF 35 to 40% by echo in 2013 b. EF improved to 50-55% by echo in 04/2015; 11/2016 Echo: EF 55-60%, Gr1 DD, mildly dil LA, nl RV fxn.  . Cerebral infarct ([place]) 08/17/2013  . Chronic diastolic CHF (congestive heart failure) (HCC)    a. EF 50-55% by echo in 04/2015; b. 11/2016 Echo: EF 55-60%, Gr1 DD.  [name] COPD (chronic obstructive pulmonary disease) ([place])   . Coronary artery disease, occlusive    a. Previous PCI to the LAD, LCx, and RCA in 2010, 2011, 2013, and 2016. All performed in [place]; b. 11/2016 Lexiscan MV: EF 43%, no ischemia->low risk.  . 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 Diabetic diarrhea ([place]) 10/03/2014  . DM type 2, uncontrolled, with renal complications ([place]) 09/10/7780  . Hepatic steatosis    by CT abd pelvis  . History of kidney stones    at a younger age  . Hyperlipidemia LDL goal <100 02/23/2014  . Hypertensive heart disease   . Osteoporosis, post-menopausal   . Peripheral vascular disease due  to secondary diabetes mellitus 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 Pill esophagitis   . Pleural effusion 10/25/2012   10/2012 CT chest >> small to moderate R lung effusion>> chylothorax, 100% lymphs 10/2013 thoracentesis> cytology negative, WBC 1471, > 90% "small lymphs" 01/2014 CT chest> near complete resolution of pleural effusion, stable lymphadenopathy 08/2014 CT chest [place] ([facility]): small right sided effusion decreased in size, stable mediastinal lymphadenopathy 1.0cm largest, 39m . 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 Sleep apnea   . Stage III chronic kidney disease ([facility]     Past Surgical History:  Procedure Laterality Date  . ABDOMINAL HYSTERECTOMY     at ge 39. secondary to bleeding/partial  . ANTERIOR CERVICAL DECOMP/DISCECTOMY FUSION N/A 10/10/2016   Procedure: Anterior Cervical Discectomy Fusion - Cervical four4- five - Cervical five-Cervical six - Cervical six-Cervical seven;  Surgeon: PEarnie Larsson MD;  [place] [facility]  Service: Neurosurgery;  Laterality: N/A;  . BREAST CYST ASPIRATION Right   . CARPAL TUNNEL RELEASE Bilateral   . CHOLE[place]STECTOMY     [place]  . COLONOSCOPY WITH PROPOFOL N/A 01/09/2015   Procedure: COLONOSCOPY  WITH PROPOFOL;  Surgeon: Lucilla Lame, MD;  Location: [facility] ENDOSCOPY;  Service: Endoscopy;  Laterality: N/A;  . CORONARY ANGIOPLASTY WITH STENT PLACEMENT     [place]; [facility]  . EYE SURGERY     bil cataracts  . HERNIA REPAIR     umbilical/ Dr Pat Patrick  . OOPHORECTOMY    . PTCA  August 2012   Right Posterior tibial artery , [place]  . TOE AMPUTATION  Sept 2012   Right 2nd toe, [place]    Family History  Problem Relation Age of Onset  . Heart  disease Father   . Heart attack Father   . Heart disease Sister   . Heart attack Sister   . Heart disease Brother   . Heart attack Brother   . Tremor Mother   . Asthma Grandchild   . Breast cancer Maternal Aunt    Social History:  reports that she has never smoked. She has never used smokeless tobacco. She reports that she does not drink alcohol or use drugs.  Allergies:  Allergies  Allergen Reactions  . Cephalexin Itching  . Contrast Media [Iodinated Diagnostic Agents] Itching  . Doxycycline Itching  . Gabapentin Itching  . Sulfa Antibiotics Itching    Medications Prior to Admission  Medication Sig Dispense Refill  . acetaminophen (TYLENOL) 500 MG tablet Take 1,000 mg by mouth every 6 (six) hours as needed for mild pain or headache.     . allopurinol (ZYLOPRIM) 100 MG tablet Take 1 tablet (100 mg total) by mouth daily. 30 tablet 0  . ALPRAZolam (XANAX) 0.25 MG tablet Take 1 tablet (0.25 mg total) by mouth daily. 30 tablet 0  . amLODipine (NORVASC) 5 MG tablet Take 1 tablet (5 mg total) by mouth daily. 90 tablet 1  . aspirin EC 81 MG tablet Take 81 mg by mouth daily.    [name] dicyclomine (BENTYL) 10 MG capsule Take 1 capsule (10 mg total) by mouth 4 (four) times daily -  before meals and at bedtime. 120 capsule 0  . gabapentin (NEURONTIN) 100 MG capsule Take 1 capsule (100 mg total) by mouth 3 (three) times daily. 90 capsule 2  . insulin aspart protamine- aspart (NOVOLOG MIX 70/30) (70-30) 100 UNIT/ML injection Inject 0.3 mLs (30 Units total) into the skin 2 (two) times daily with a meal. 10 mL 11  . isosorbide mononitrate (IMDUR) 30 MG 24 hr tablet Take 1 tablet (30 mg total) by mouth daily. 90 tablet 2  . mometasone-formoterol (DULERA) 200-5 MCG/ACT AERO Inhale 2 puffs into the lungs 2 (two) times daily. 13 g 3  . rosuvastatin (CRESTOR) 20 MG tablet Take 1 tablet (20 mg total) by mouth daily. 90 tablet 2  . ticagrelor (BRILINTA) 60 MG TABS tablet Take 1 tablet (60 mg total) by mouth  2 (two) times daily. 60 tablet 11  . B-D UF III MINI PEN NEEDLES 31G X 5 MM MISC USE THREE TIMES A DAY 270 each 4  . blood glucose meter kit and supplies Dispense based on patient and insurance preference. Use up to three times daily as directed. (FOR ICD-10 E11.21) 1 each 0  . diphenoxylate-atropine (LOMOTIL) 2.5-0.025 MG tablet Take 1 tablet by mouth 4 (four) times daily as needed for diarrhea or loose stools. 10 tablet 0  . feeding supplement, GLUCERNA SHAKE, (GLUCERNA SHAKE) LIQD Take 237 mLs by mouth 2 (two) times daily between meals. 60 Can 0  . fluticasone (FLONASE) 50 MCG/ACT nasal spray Place 2  sprays into both nostrils daily as needed for rhinitis. 16 g 5  . glucose blood (ACCU-CHEK AVIVA PLUS) test strip USE TO CHECK GLUCOSE THREE TIMES DAILY 200 each 5  . glucose blood test strip Use to check blood sugars 2 times daily 100 each 12  . nitroGLYCERIN (NITROLINGUAL) 0.4 MG/SPRAY spray Place 1 spray under the tongue every 5 (five) minutes x 3 doses as needed for chest pain. 12 g 12    Results for orders placed or performed during the hospital encounter of 04/25/18 (from the past 48 hour(s))  CBC with Differential     Status: Abnormal   Collection Time: 04/25/18  1:30 AM  Result Value Ref Range   WBC 7.8 4.0 - 10.5 K/uL   RBC 3.68 (L) 3.87 - 5.11 MIL/uL   Hemoglobin 10.4 (L) 12.0 - 15.0 g/dL   HCT 33.2 (L) 36.0 - 46.0 %   MCV 90.2 80.0 - 100.0 fL   MCH 28.3 26.0 - 34.0 pg   MCHC 31.3 30.0 - 36.0 g/dL   RDW 15.4 11.5 - 15.5 %   Platelets 355 150 - 400 K/uL   nRBC 0.0 0.0 - 0.2 %   Neutrophils Relative % 71 %   Neutro Abs 5.6 1.7 - 7.7 K/uL   Lymphocytes Relative 16 %   Lymphs Abs 1.2 0.7 - 4.0 K/uL   Monocytes Relative 9 %   Monocytes Absolute 0.7 0.1 - 1.0 K/uL   Eosinophils Relative 2 %   Eosinophils Absolute 0.2 0.0 - 0.5 K/uL   Basophils Relative 1 %   Basophils Absolute 0.1 0.0 - 0.1 K/uL   Immature Granulocytes 1 %   Abs Immature Granulocytes 0.05 0.00 - 0.07 K/uL     Comment: Performed at [facility], [address] [place], [place] [address]  Comprehensive metabolic panel     Status: Abnormal   Collection Time: 04/25/18  1:30 AM  Result Value Ref Range   Sodium 138 135 - 145 mmol/L   Potassium 3.2 (L) 3.5 - 5.1 mmol/L   Chloride 106 98 - 111 mmol/L   CO2 24 22 - 32 mmol/L   Glucose, Bld 180 (H) 70 - 99 mg/dL   BUN 15 8 - 23 mg/dL   Creatinine, Ser 1.18 (H) 0.44 - 1.00 mg/dL   Calcium 9.9 8.9 - 10.3 mg/dL   Total Protein 7.5 6.5 - 8.1 g/dL   Albumin 3.5 3.5 - 5.0 g/dL   AST 22 15 - 41 U/L   ALT 15 0 - 44 U/L   Alkaline Phosphatase 61 38 - 126 U/L   Total Bilirubin 0.6 0.3 - 1.2 mg/dL   GFR calc non Af Amer 44 (L) >60 mL/min   GFR calc Af Amer 52 (L) >60 mL/min   Anion gap 8 5 - 15    Comment: Performed at [facility], [address] [place], [place] [address]  Brain natriuretic peptide     Status: Abnormal   Collection Time: 04/25/18  1:30 AM  Result Value Ref Range   B Natriuretic Peptide 282.0 (H) 0.0 - 100.0 pg/mL    Comment: Performed at [facility], [address] [place], [place] [address]  Troponin I - Once     Status: Abnormal   Collection Time: 04/25/18  1:30 AM  Result Value Ref Range   Troponin I 0.03 (HH) <0.03 ng/mL    Comment: CRITICAL RESULT CALLED TO, READ BACK BY AND VERIFIED WITH GEORGIE HAHLE ON 04/25/18 3419  [facility] Performed at [facility], [address] [place], [place] [address]    *Note: Due to a large number of results and/or encounters for the requested time period, some results have not been displayed. A complete set of results can be found in Results Review.   Dg Chest Port 1 View  Result Date: 04/25/2018 CLINICAL DATA:  Shortness of breath. EXAM: PORTABLE CHEST 1 VIEW COMPARISON:  Chest radiograph March 19, 2018 FINDINGS: Cardiac silhouette is mildly enlarged. Calcified aortic arch. Low inspiratory examination with increased interstitial prominence. New bandlike  density RIGHT lung base with small RIGHT pleural effusion tracking to major fissure. Calcified mediastinal lymph nodes. No pneumothorax. ACDF. Old LEFT rib fractures. IMPRESSION: 1. Cardiomegaly. Interstitial prominence suggest pulmonary edema superimposed on chronic interstitial changes. 2. Small RIGHT pleural effusion.  RIGHT lung base atelectasis. Electronically Signed   By: Elon Alas M.D.   On: 04/25/2018 02:16    Review of Systems  Constitutional: Negative for chills and fever.  HENT: Negative for sore throat and tinnitus.   Eyes: Negative for blurred vision and redness.  Respiratory: Positive for shortness of breath. Negative for cough.   Cardiovascular: Negative for chest pain, palpitations, orthopnea and PND.  Gastrointestinal: Negative for abdominal pain, diarrhea, nausea and vomiting.  Genitourinary: Negative for dysuria, frequency and urgency.  Musculoskeletal: Negative for joint pain and myalgias.  Skin: Negative for rash.       No lesions  Neurological: Negative for speech change, focal weakness and weakness.  Endo/Heme/Allergies: Does not bruise/bleed easily.       No temperature intolerance  Psychiatric/Behavioral: Negative for depression and suicidal ideas.    Blood pressure (!) 201/76, pulse 79, temperature 98.2 F (36.8 C), temperature source Oral, resp. rate (!) 24, height 5' 3"  (1.6 m), weight 88.5 kg, SpO2 95 %. Physical Exam  Vitals reviewed. Constitutional: She is oriented to person, place, and time. She appears well-developed and well-nourished. No distress.  HENT:  Head: Normocephalic and atraumatic.  Mouth/Throat: Oropharynx is clear and moist.  Eyes: Pupils are equal, round, and reactive to light. Conjunctivae and EOM are normal. No scleral icterus.  Neck: Normal range of motion. Neck supple. No JVD present. No tracheal deviation present. No thyromegaly present.  Cardiovascular: Normal rate, regular rhythm and normal heart sounds. Exam reveals no  gallop and no friction rub.  No murmur heard. Respiratory: Effort normal and breath sounds normal.  GI: Soft. Bowel sounds are normal. She exhibits no distension. There is no abdominal tenderness.  Genitourinary:    Genitourinary Comments: Deferred   Musculoskeletal: Normal range of motion.        General: No edema.  Lymphadenopathy:    She has no cervical adenopathy.  Neurological: She is alert and oriented to person, place, and time. No cranial nerve deficit. She exhibits normal muscle tone.  Skin: Skin is warm and dry. No rash noted. No erythema.  Psychiatric: She has a normal mood and affect. Her behavior is normal. Judgment and thought content normal.     Assessment/Plan This is a 77 year old female admitted for CHF exacerbation. 1.  CHF: Acute on chronic; diastolic.  Continue diuresis as necessary.  It appears that the patient's home dose of Lasix was dropped from her medication list during her SNF rehab.  We will find fluid balance and reestablish diuretic therapy for outpatient use. 2.  COPD: With mild exacerbation as there is some wheezing.  Contributes to respiratory distress.  Continue inhaled corticosteroid. 3.  CAD: Stable; continue aspirin  and Brilinta.  Continue Imdur 4.  Hypertension: Uncontrolled; I have applied Nitropaste to the chest in the emergency department.  Continue amlodipine.  Add hydralazine. 5.  Diabetes mellitus type 2: Continue basal insulin as well as sliding scale 6.  Hyperlipidemia: Continue statin therapy 7.  DVT prophylaxis: Heparin 8.  GI prophylaxis: None The patient is a full code.  Time spent on admission orders and patient care approximately 45 minutes   Harrie Foreman, MD 04/25/2018, 4:56 AM

## 2018-04-25 NOTE — ED Notes (Signed)
EDP [place] notified in person trop 0.03.

## 2018-04-25 NOTE — ED Notes (Signed)
Pt placed on bed pan by this writer, pt given call bell and this tech out of the room for pt privacy

## 2018-04-25 NOTE — ED Triage Notes (Signed)
Pt in via EMS from home d/t SOB. Pt had used nebs at home without improvement. Pt started having tremors along with SOB. Used 1 spray of nitro at home with EMS present. EMS placed 20g L ac IV.

## 2018-04-25 NOTE — ED Notes (Signed)
Purewick applied for pt at this time.

## 2018-04-25 NOTE — Progress Notes (Signed)
Advanced care plan.  Purpose of the Encounter: CODE STATUS  Parties in Attendance: Patient herself  Patient's Decision Capacity: Intact  Subjective/Patient's story: Patient is a 77 year old African-American female with history of chronic diastolic CHF and COPD admitted with worsening shortness of breath   Objective/Medical story  I discussed with the patient regarding her desires for cardiac and pulmonary resuscitation as well as further goals of care  Goals of care determination:   Patient states that she would like to be a full code and would want everything done medically with aggressive treatment  CODE STATUS: Full code  Time spent discussing advanced care planning: 16 minutes

## 2018-04-25 NOTE — Progress Notes (Signed)
   04/25/18 1000  Clinical Encounter Type  Visited With Patient  Visit Type Initial;Spiritual support  Referral From Nurse (OR for Prayer)  Recommendations Follow-up as needed.  Spiritual Encounters  Spiritual Needs Prayer;Emotional  Stress Factors  Patient Stress Factors Health changes   Chaplain responded to an OR for prayer and met with the patient, who was cold, and unable to find Sunday morning religious services on the TV. Chaplain found a religious channel which pleased the patient. Also, a NT was notified that warm blankets were needed. After the patient was warmed, she discussed her multiple health concerns, difficulty providing care for herself and husband, and fears of her declining health. Chaplain provided active listening, empathetic support, and prayer to support her beliefs. Patient became emotive after prayer and Chaplain remained until the patient became calm. Patient was appreciative of the visit.

## 2018-04-25 NOTE — ED Notes (Signed)
EKG given to EDP.

## 2018-04-25 NOTE — ED Notes (Signed)
ED TO INPATIENT HANDOFF REPORT  Name/Age/Gender Pamela Collier 77 y.o. female  Code Status Code Status History    Date Active Date Inactive Code Status Order ID Comments User Context   06/04/2017 0151 06/04/2017 1944 Full Code [id number]  Lance Coon, MD Inpatient   05/24/2017 0129 05/26/2017 1926 Full Code [id number]  Lance Coon, MD Inpatient   11/14/2016 2249 11/15/2016 Christmas Full Code [id number]  [place], [place], DO ED   10/10/2016 1325 10/13/2016 2020 Full Code [id number]  Earnie Larsson, MD Inpatient   06/11/2016 2142 06/12/2016 1923 Full Code [id number]  [place], Ubaldo Glassing, DO ED   01/02/2016 0524 01/02/2016 0914 Full Code [id number]  Saundra Shelling, MD Inpatient   06/26/2015 0736 06/29/2015 1954 Full Code [id number]  Demetrios Loll, MD ED   04/20/2015 0118 04/22/2015 1918 Full Code [id number]  Lance Coon, MD Inpatient      Home/SNF/Other Home  Chief Complaint Shortness of breath  Level of Care/Admitting Diagnosis ED Disposition    ED Disposition Condition [place]: [place] [100120]  Level of Care: Med-Surg [16]  Diagnosis: Acute on chronic diastolic heart failure ([place]) [428.33.ICD-9-CM]  Admitting Physician: Harrie Foreman [[id number]]  Attending Physician: Harrie Foreman [[id number]]  PT Class (Do Not Modify): Observation [104]  PT Acc Code (Do Not Modify): Observation [10022]       Medical History Past Medical History:  Diagnosis Date  . Acute respiratory failure ([place]) 11/21/2014  . Anxiety   . Arthritis 08/05/2013  . Atherosclerotic peripheral vascular disease with gangrene [facility]) august 2012  . Cardiomyopathy, ischemic    a. EF 35 to 40% by echo in 2013 b. EF improved to 50-55% by echo in 04/2015; 11/2016 Echo: EF 55-60%, Gr1 DD, mildly dil LA, nl RV fxn.  . Cerebral infarct ([place]) 08/17/2013  . Chronic diastolic CHF (congestive heart failure) (HCC)    a. EF 50-55% by echo in 04/2015; b. 11/2016 Echo: EF 55-60%, Gr1 DD.  [name] COPD (chronic  obstructive pulmonary disease) ([place])   . Coronary artery disease, occlusive    a. Previous PCI to the LAD, LCx, and RCA in 2010, 2011, 2013, and 2016. All performed in [place]; b. 11/2016 Lexiscan MV: EF 43%, no ischemia->low risk.  . 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 Diabetic diarrhea ([place]) 10/03/2014  . DM type 2, uncontrolled, with renal complications ([place]) 10/12/2295  . Hepatic steatosis    by CT abd pelvis  . History of kidney stones    at a younger age  . Hyperlipidemia LDL goal <100 02/23/2014  . Hypertensive heart disease   . Osteoporosis, post-menopausal   . 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 Pill esophagitis   . 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 Sleep apnea   .  Stage III chronic kidney disease (HCC)     Allergies Allergies  Allergen Reactions  . Cephalexin Itching  . Contrast Media [Iodinated Diagnostic Agents] Itching  . Doxycycline Itching  . Gabapentin Itching  . Sulfa Antibiotics Itching    IV Location/Drains/Wounds Patient Lines/Drains/Airways Status   Active Line/Drains/Airways    Name:   Placement date:   Placement time:   Site:   Days:   Peripheral IV 04/25/18 Right Forearm   04/25/18    0129    Forearm   less than 1           Labs/Imaging Results for orders placed or performed during the hospital encounter of 04/25/18 (from the past 48 hour(s))  CBC with Differential     Status: Abnormal   Collection Time: 04/25/18  1:30 AM  Result Value Ref Range   WBC 7.8 4.0 - 10.5 K/uL   RBC 3.68 (L) 3.87 - 5.11 MIL/uL   Hemoglobin 10.4 (L) 12.0 - 15.0 g/dL   HCT 33.2 (L) 36.0 - 46.0 %   MCV 90.2 80.0 - 100.0 fL   MCH 28.3 26.0 - 34.0 pg   MCHC 31.3 30.0 - 36.0 g/dL   RDW 15.4 11.5 - 15.5 %   Platelets 355 150 - 400 K/uL   nRBC 0.0 0.0 - 0.2 %   Neutrophils Relative % 71 %   Neutro Abs 5.6 1.7 - 7.7 K/uL   Lymphocytes Relative 16 %   Lymphs Abs 1.2 0.7 - 4.0 K/uL   Monocytes Relative 9 %   Monocytes Absolute 0.7 0.1 - 1.0 K/uL   Eosinophils Relative 2 %   Eosinophils Absolute 0.2 0.0 - 0.5 K/uL   Basophils Relative 1 %   Basophils Absolute 0.1 0.0 - 0.1 K/uL   Immature Granulocytes 1 %   Abs Immature Granulocytes 0.05 0.00 - 0.07 K/uL    Comment: Performed at [facility], [address] [place], [place] [address]  Comprehensive metabolic panel     Status: Abnormal   Collection Time: 04/25/18  1:30 AM  Result Value Ref Range   Sodium 138 135 - 145 mmol/L   Potassium 3.2 (L) 3.5 - 5.1 mmol/L   Chloride 106 98 - 111 mmol/L   CO2 24 22 - 32 mmol/L   Glucose, Bld 180 (H) 70 - 99 mg/dL   BUN 15 8 - 23 mg/dL   Creatinine, Ser 1.18 (H) 0.44 - 1.00 mg/dL   Calcium 9.9 8.9 - 10.3 mg/dL   Total Protein 7.5 6.5 - 8.1 g/dL   Albumin 3.5 3.5 - 5.0 g/dL   AST 22 15 - 41 U/L   ALT 15 0 - 44 U/L   Alkaline Phosphatase 61 38 - 126 U/L   Total Bilirubin 0.6 0.3 - 1.2 mg/dL   GFR calc non Af Amer 44 (L) >60 mL/min   GFR calc Af Amer 52 (L) >60 mL/min   Anion gap 8 5 - 15    Comment: Performed at [facility], [address] [place], [place] [address]  Brain natriuretic peptide     Status: Abnormal   Collection Time: 04/25/18  1:30 AM  Result Value Ref Range   B Natriuretic Peptide 282.0  (H) 0.0 - 100.0 pg/mL    Comment: Performed at [facility], [address] [place], [place] [address]  Troponin I - Once     Status: Abnormal   Collection Time: 04/25/18  1:30 AM  Result Value Ref Range  Troponin I 0.03 (HH) <0.03 ng/mL    Comment: CRITICAL RESULT CALLED TO, READ BACK BY AND VERIFIED WITH GEORGIE HAHLE ON 04/25/18 286[facility] Performed at [facility], [address], [place], [place] [address]    *Note: Due to a large number of results and/or encounters for the requested time period, some results have not been displayed. A complete set of results can be found in Results Review.   Dg Chest Port 1 View  Result Date: 04/25/2018 CLINICAL DATA:  Shortness of breath. EXAM: PORTABLE CHEST 1 VIEW COMPARISON:  Chest radiograph March 19, 2018 FINDINGS: Cardiac silhouette is mildly enlarged. Calcified aortic arch. Low inspiratory examination with increased interstitial prominence. New bandlike density RIGHT lung base with small RIGHT pleural effusion tracking to major fissure. Calcified mediastinal lymph nodes. No pneumothorax. ACDF. Old LEFT rib fractures. IMPRESSION: 1. Cardiomegaly. Interstitial prominence suggest pulmonary edema superimposed on chronic interstitial changes. 2. Small RIGHT pleural effusion.  RIGHT lung base atelectasis. Electronically Signed   By: Elon Alas M.D.   On: 04/25/2018 02:16    Pending Labs [employer] (From admission, onward)    Start     Ordered   Signed and Held  TSH  Add-on,   R     Signed and Held   Signed and Held  Troponin I - Now Then Q6H  Now then every 6 hours,   R     Signed and Held          Vitals/Pain Today's Vitals   04/25/18 0200 04/25/18 0300 04/25/18 0500 04/25/18 0557  BP: (!) 192/70 (!) 201/76 (!) 172/66 (!) 172/67  Pulse: 80 79 80 60  Resp: 13 (!) 24 20 15   Temp:      TempSrc:      SpO2: 98% 95% 98% 98%  Weight:      Height:      PainSc:        Isolation Precautions No active  isolations  Medications Medications  methylPREDNISolone sodium succinate (SOLU-MEDROL) 125 mg/2 mL injection 125 mg (125 mg Intravenous Given 04/25/18 0138)  furosemide (LASIX) injection 40 mg (40 mg Intravenous Given 04/25/18 0322)  nitroGLYCERIN (NITROGLYN) 2 % ointment 0.5 inch (0.5 inches Topical Given 04/25/18 0530)  labetalol (NORMODYNE,TRANDATE) injection 10 mg (10 mg Intravenous Given 04/25/18 0530)    Mobility walks with person assist

## 2018-04-26 DIAGNOSIS — F419 Anxiety disorder, unspecified: Secondary | ICD-10-CM

## 2018-04-26 DIAGNOSIS — Z89421 Acquired absence of other right toe(s): Secondary | ICD-10-CM

## 2018-04-26 DIAGNOSIS — I255 Ischemic cardiomyopathy: Secondary | ICD-10-CM

## 2018-04-26 DIAGNOSIS — I5033 Acute on chronic diastolic (congestive) heart failure: Secondary | ICD-10-CM | POA: Diagnosis not present

## 2018-04-26 DIAGNOSIS — G250 Essential tremor: Secondary | ICD-10-CM

## 2018-04-26 DIAGNOSIS — R7989 Other specified abnormal findings of blood chemistry: Secondary | ICD-10-CM

## 2018-04-26 DIAGNOSIS — M810 Age-related osteoporosis without current pathological fracture: Secondary | ICD-10-CM

## 2018-04-26 DIAGNOSIS — D860 Sarcoidosis of lung: Secondary | ICD-10-CM | POA: Diagnosis not present

## 2018-04-26 DIAGNOSIS — E669 Obesity, unspecified: Secondary | ICD-10-CM

## 2018-04-26 DIAGNOSIS — I10 Essential (primary) hypertension: Secondary | ICD-10-CM | POA: Diagnosis not present

## 2018-04-26 DIAGNOSIS — N184 Chronic kidney disease, stage 4 (severe): Secondary | ICD-10-CM | POA: Diagnosis not present

## 2018-04-26 DIAGNOSIS — N390 Urinary tract infection, site not specified: Secondary | ICD-10-CM | POA: Diagnosis not present

## 2018-04-26 DIAGNOSIS — J449 Chronic obstructive pulmonary disease, unspecified: Secondary | ICD-10-CM | POA: Diagnosis not present

## 2018-04-26 DIAGNOSIS — K760 Fatty (change of) liver, not elsewhere classified: Secondary | ICD-10-CM

## 2018-04-26 DIAGNOSIS — J441 Chronic obstructive pulmonary disease with (acute) exacerbation: Secondary | ICD-10-CM | POA: Diagnosis not present

## 2018-04-26 DIAGNOSIS — Z9181 History of falling: Secondary | ICD-10-CM

## 2018-04-26 DIAGNOSIS — R531 Weakness: Secondary | ICD-10-CM | POA: Diagnosis not present

## 2018-04-26 DIAGNOSIS — F0390 Unspecified dementia without behavioral disturbance: Secondary | ICD-10-CM | POA: Diagnosis not present

## 2018-04-26 DIAGNOSIS — Z794 Long term (current) use of insulin: Secondary | ICD-10-CM

## 2018-04-26 DIAGNOSIS — I509 Heart failure, unspecified: Secondary | ICD-10-CM

## 2018-04-26 DIAGNOSIS — I2510 Atherosclerotic heart disease of native coronary artery without angina pectoris: Secondary | ICD-10-CM | POA: Diagnosis not present

## 2018-04-26 DIAGNOSIS — Z8701 Personal history of pneumonia (recurrent): Secondary | ICD-10-CM

## 2018-04-26 DIAGNOSIS — R2689 Other abnormalities of gait and mobility: Secondary | ICD-10-CM

## 2018-04-26 DIAGNOSIS — I69391 Dysphagia following cerebral infarction: Secondary | ICD-10-CM

## 2018-04-26 DIAGNOSIS — D631 Anemia in chronic kidney disease: Secondary | ICD-10-CM | POA: Diagnosis not present

## 2018-04-26 DIAGNOSIS — I25118 Atherosclerotic heart disease of native coronary artery with other forms of angina pectoris: Secondary | ICD-10-CM

## 2018-04-26 DIAGNOSIS — E1140 Type 2 diabetes mellitus with diabetic neuropathy, unspecified: Secondary | ICD-10-CM

## 2018-04-26 DIAGNOSIS — E1151 Type 2 diabetes mellitus with diabetic peripheral angiopathy without gangrene: Secondary | ICD-10-CM

## 2018-04-26 DIAGNOSIS — R1310 Dysphagia, unspecified: Secondary | ICD-10-CM | POA: Diagnosis not present

## 2018-04-26 DIAGNOSIS — F329 Major depressive disorder, single episode, unspecified: Secondary | ICD-10-CM

## 2018-04-26 DIAGNOSIS — E1122 Type 2 diabetes mellitus with diabetic chronic kidney disease: Secondary | ICD-10-CM | POA: Diagnosis not present

## 2018-04-26 DIAGNOSIS — I5032 Chronic diastolic (congestive) heart failure: Secondary | ICD-10-CM | POA: Diagnosis not present

## 2018-04-26 LAB — BASIC METABOLIC PANEL
Anion gap: 10 (ref 5–15)
BUN: 29 mg/dL — ABNORMAL HIGH (ref 8–23)
CALCIUM: 10.1 mg/dL (ref 8.9–10.3)
CO2: 24 mmol/L (ref 22–32)
Chloride: 100 mmol/L (ref 98–111)
Creatinine, Ser: 1.43 mg/dL — ABNORMAL HIGH (ref 0.44–1.00)
GFR calc Af Amer: 41 mL/min — ABNORMAL LOW (ref >60)
GFR calc non Af Amer: 35 mL/min — ABNORMAL LOW (ref >60)
Glucose, Bld: 347 mg/dL — ABNORMAL HIGH (ref 70–99)
Potassium: 3.9 mmol/L (ref 3.5–5.1)
Sodium: 134 mmol/L — ABNORMAL LOW (ref 135–145)

## 2018-04-26 LAB — GLUCOSE, CAPILLARY
Glucose-Capillary: 322 mg/dL — ABNORMAL HIGH (ref 70–99)
Glucose-Capillary: 330 mg/dL — ABNORMAL HIGH (ref 70–99)
Glucose-Capillary: 327 mg/dL — ABNORMAL HIGH (ref 70–99)
Glucose-Capillary: 410 mg/dL — ABNORMAL HIGH (ref 70–99)
Glucose-Capillary: 420 mg/dL — ABNORMAL HIGH (ref 70–99)
Glucose-Capillary: 279 mg/dL — ABNORMAL HIGH (ref 70–99)

## 2018-04-26 IMAGING — CR DG CHEST 2V
3 series · 3 of 3 positions shown · non-contrast
Comparison: Chest radiograph 10/23/2015

CLINICAL DATA: 74 y/o F; chest pain and shortness of breath as well
as back pain.

EXAM:
CHEST  2 VIEW

[chest lat (1 of 2)]
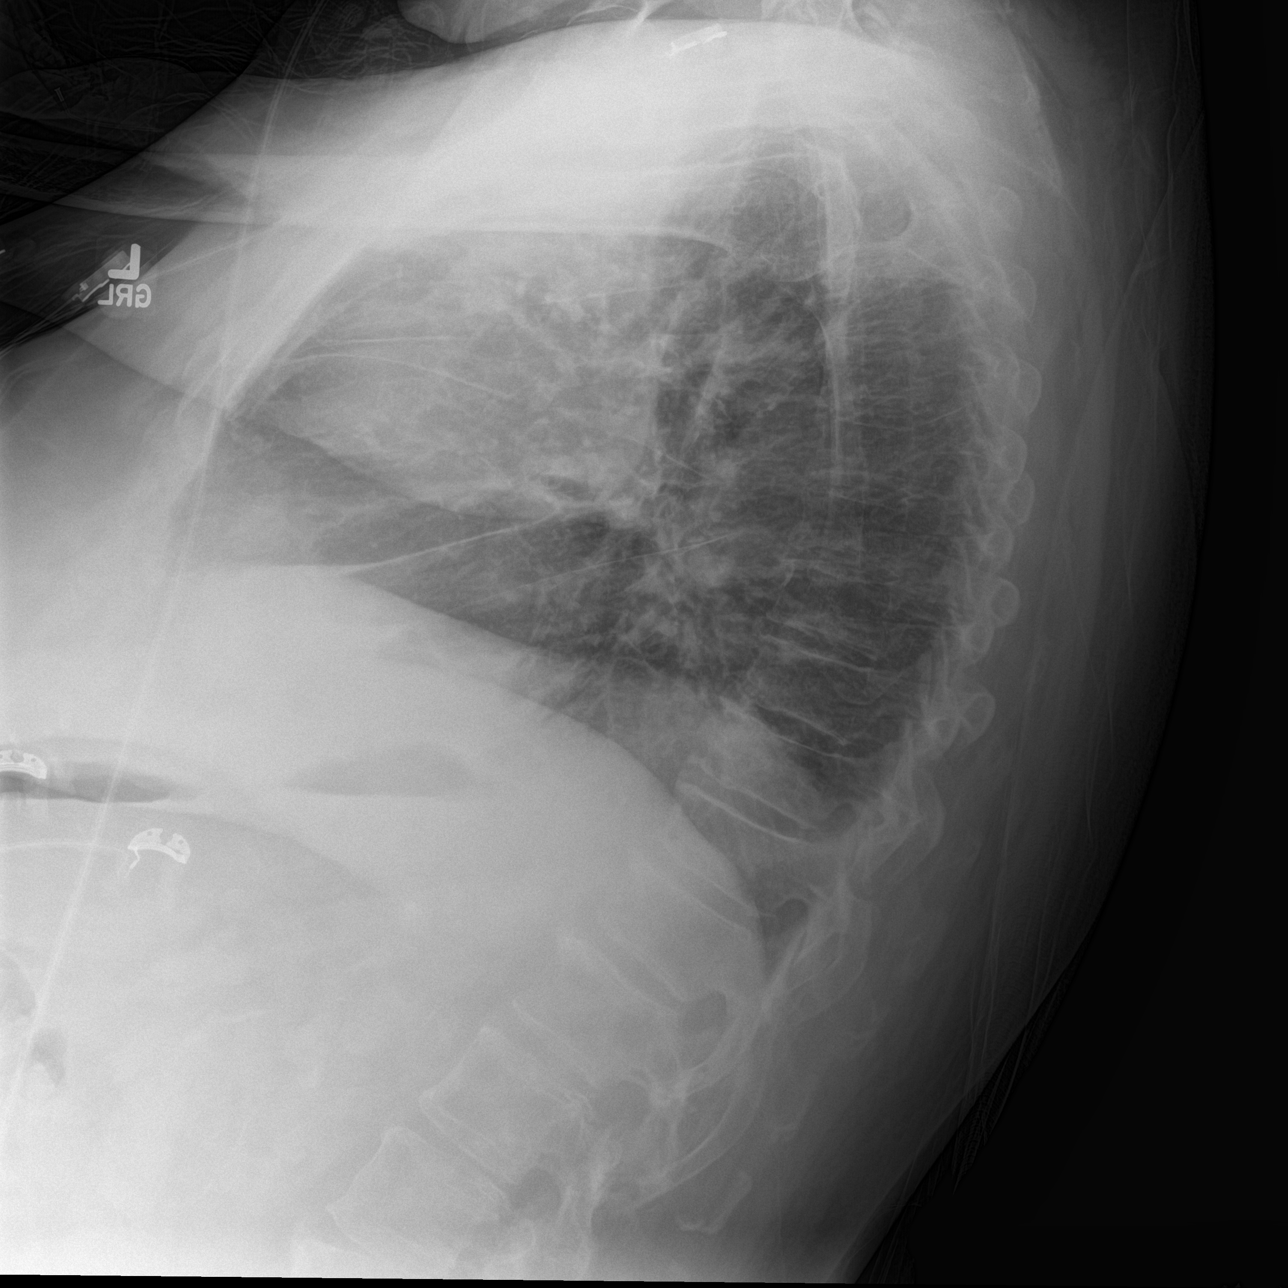

[chest lat (2 of 2)]
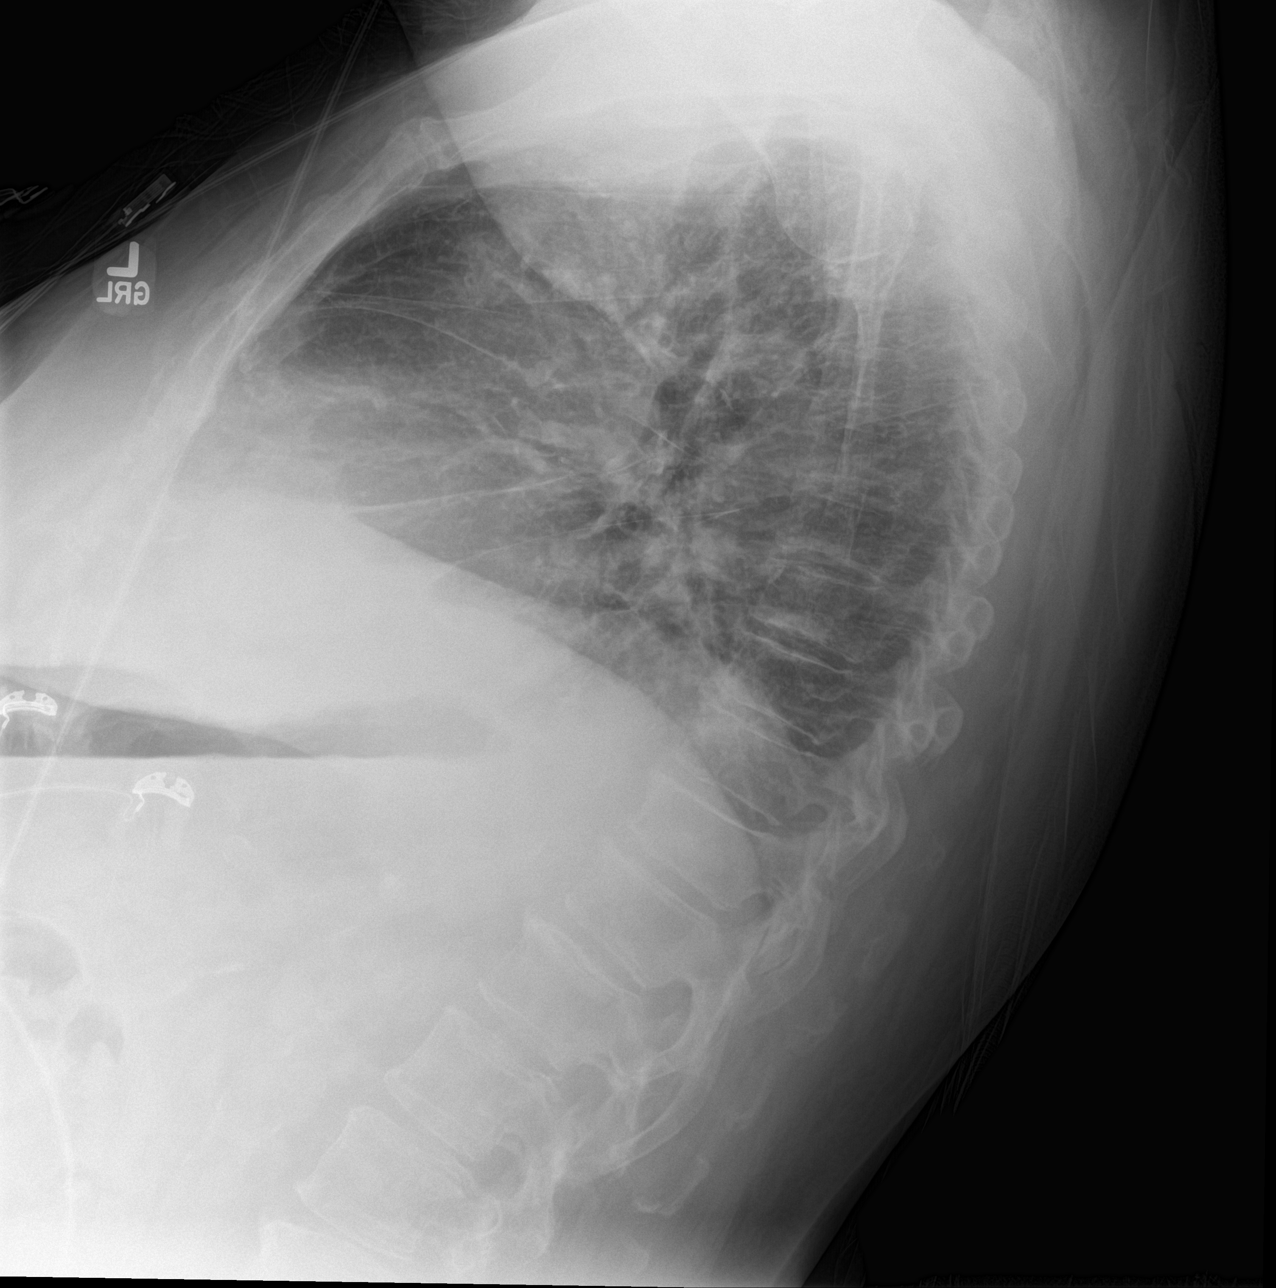

[chest ap]
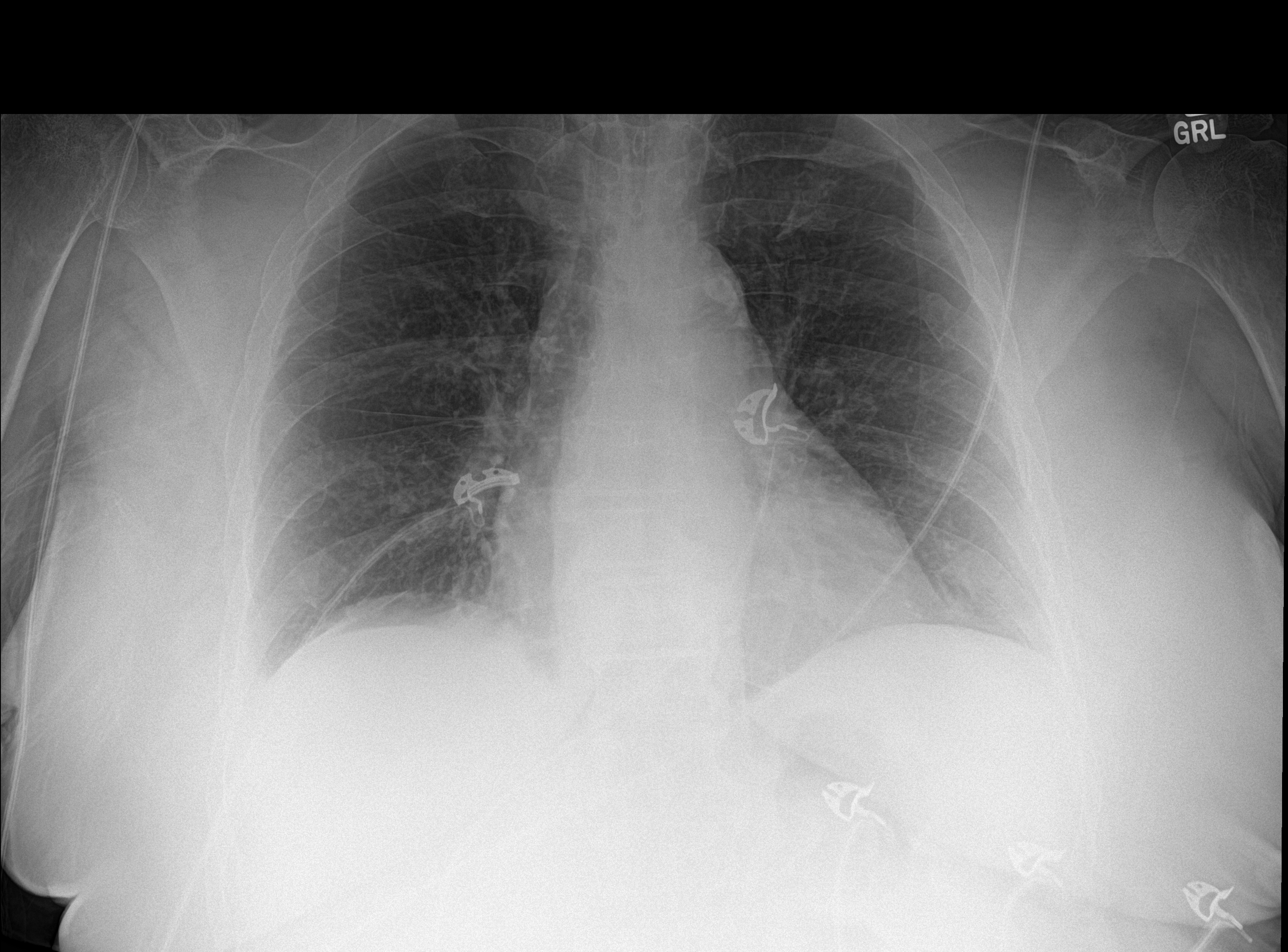

[3 of 3 positions shown; findings below may reference images not displayed]

FINDINGS: Stable cardiac silhouette given differences in technique. Linear
opacities at lung bases probably represent minor atelectasis. No
consolidation, pneumothorax, or pleural effusion. Chronic left
posterior lateral upper rib fractures. No acute osseous abnormality
is identified.
IMPRESSION: Bibasilar minor atelectasis. No acute cardiopulmonary process is
identified.

By: Jose Gabriel Jorgensen M.D.

## 2018-04-26 MED ORDER — METHYLPREDNISOLONE SODIUM SUCC 125 MG IJ SOLR
60.0000 mg | INTRAMUSCULAR | Status: DC
  Administered 2018-04-27 – 2018-04-30 (×4): 60 mg via INTRAVENOUS
  Filled 2018-04-26 (×4): qty 2

## 2018-04-26 MED ORDER — PREMIER PROTEIN SHAKE
11.0000 [oz_av] | Freq: Two times a day (BID) | ORAL | Status: DC
  Administered 2018-04-28 – 2018-04-30 (×4): 11 [oz_av] via ORAL

## 2018-04-26 MED ORDER — MORPHINE SULFATE (PF) 2 MG/ML IV SOLN
1.0000 mg | Freq: Once | INTRAVENOUS | Status: AC
  Administered 2018-04-26: 1 mg via INTRAVENOUS
  Filled 2018-04-26: qty 1

## 2018-04-26 MED ORDER — NITROGLYCERIN 2 % TD OINT
0.5000 [in_us] | TOPICAL_OINTMENT | Freq: Four times a day (QID) | TRANSDERMAL | Status: DC
  Administered 2018-04-26 – 2018-04-28 (×8): 0.5 [in_us] via TOPICAL
  Filled 2018-04-26 (×8): qty 1

## 2018-04-26 MED ORDER — FUROSEMIDE 40 MG PO TABS: 40.0000 mg | ORAL_TABLET | Freq: Every day | ORAL | Status: DC

## 2018-04-26 MED ORDER — INSULIN GLARGINE 100 UNIT/ML ~~LOC~~ SOLN
40.0000 [IU] | Freq: Every day | SUBCUTANEOUS | Status: DC
  Administered 2018-04-26: 40 [IU] via SUBCUTANEOUS
  Filled 2018-04-26 (×2): qty 0.4

## 2018-04-26 MED ORDER — INSULIN ASPART 100 UNIT/ML ~~LOC~~ SOLN
5.0000 [IU] | Freq: Three times a day (TID) | SUBCUTANEOUS | Status: DC
  Administered 2018-04-26 – 2018-04-30 (×11): 5 [IU] via SUBCUTANEOUS
  Filled 2018-04-26 (×11): qty 1

## 2018-04-26 MED ORDER — DIPHENHYDRAMINE HCL 25 MG PO CAPS
25.0000 mg | ORAL_CAPSULE | Freq: Four times a day (QID) | ORAL | Status: DC | PRN
  Administered 2018-04-26 – 2018-04-27 (×2): 25 mg via ORAL
  Filled 2018-04-26 (×2): qty 1

## 2018-04-26 MED ORDER — INSULIN ASPART 100 UNIT/ML ~~LOC~~ SOLN
20.0000 [IU] | Freq: Once | SUBCUTANEOUS | Status: AC
  Administered 2018-04-26: 20 [IU] via SUBCUTANEOUS
  Filled 2018-04-26: qty 1

## 2018-04-26 NOTE — Progress Notes (Signed)
Pt reports shortness of breath and difficulty breathing. She states, "It feels like I did when I was in the ED." O2 sats 96-97% on 2L and lung sound clear and diminished. No adventitious lung sounds heard. Pt also c/o of pounding headache. MD Marcille Blanco made aware. IV Morphine 1 ml and nitropaste ordered.   Update 04:15: Pt states she feels much better and is breathing better with no more pain. Will continue to monitor.

## 2018-04-26 NOTE — Consult Note (Addendum)
Cardiology Consultation:   Patient ID: Pamela Collier MRN: [id number]; DOB: Mar 23, 1941  Admit date: 04/25/2018 Date of Consult: 04/26/2018  Primary Care Provider: [name], MD Primary Cardiologist: Ida Rogue, MD  Primary Electrophysiologist:  None    Patient Profile:   Pamela Collier is a 77 y.o. 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 Posey Pronto.  History of Present Illness:   Pamela Collier is a 77 year old female with complex PMH as above.  She is status post multiple PCI's to the LAD, left circumflex, and RCA, catheterizations performed in 2010, 2011, 2013, and 2016 and reportedly all being performed in [place] between 2010 and 2016.    In 11/2016, she was admitted with chest tightness in the setting of participating in physical therapy.  She had mild troponin elevation to a peak of 0.03.  Echo showed normal LVEF and stress testing was low risk with EF 43% but no ischemia.  She was doing well at follow-up visits in July and October 2018.  In early 05/2017, she was admitted with central chest discomfort, weakness, and cough.  Troponins were negative and there was suspicion for pill esophagitis.  She was found to have right basilar pneumonia and was treated with antibiotics and subsequently discharged.  Unfortunately, she was readmitted a week later with ongoing weakness and intermittent left-sided chest discomfort. EKG was nonacute and troponins were normal despite prolonged symptoms.  Pain was reproducible with palpation over the left chest.  In the setting of atypical symptoms and recent negative Myoview medical therapy was recommended. At follow-up on 06/09/2017 in the office, she continued to have mild left  chest/breast tenderness.  She had followed up with her primary care and reportedly given concern for pill esophagitis, Fosamax and ASA were held.  PPI was prescribed she had been referred for EGD.  She continued to note generalized malaise and weakness, which have persisted since her initial hospitalization for pneumonia.  She had chronic DOE, which she reported a stable.  She denied palpitations, PND, orthopnea, dizziness, syncope, edema, or early satiety.  In 03/2018, she was reportedly seen in the ED for atypical chest pain on 11/4 and discharged per ED after ACS was ruled out. On 11/8, she was seen in the ED and admitted for dizziness and near syncope. At presentation, the patient was reportedly not orthostatic but rather dehydrated.  She reportedly hit her head during the fall and was presumed at presentation to the ED to have a concussion given her report of headaches.  Her headaches improved during the admission to the hospital.  Of note, she was also reportedly having chronic diarrhea leading up to the fall, which likely contributed to the event.  Dehydration, she suffered AKI with improvement of her creatinine from 2.18 down to 1.55.  ARB and Lasix were held during the admission.  Her Toprol was also held due to sinus pauses with no more pauses seen on the monitor.  She was reportedly also suffering from acute cystitis and started on antibiotics.  Norvasc 5 mg daily was added for hypertension.  At discharge, she was instructed to stop taking diuretic/  furosemide 20 mg, ARB/ losartan 100 mg, statin therapy with crestor 2m, and BB/Toprol-XL 25 mg daily. She was discharged with CCB/  norvasc '5mg'$  daily, ASA '81mg'$  daily, ACE/ donepezil '10mg'$  daily, PRN SL nitro, and imdur '30mg'$  daily.  Patient reported successful completion of rehab and initial improvement in breathing. She has been staying at home with her husband. She reportedly has been sleeping with 2 pillows and a CPAP, which occasionally helps her SOB.   She stated she occasionally was sleeping in a recliner; however, she stated that this is due to her neuropathy.  She was ambulating around the house with a walker. At follow-up in the office on 04/22/2018, she was reportedly had 2 episodes of diarrhea since her discharge.  She appeared euvolemic on that visit with no changes to her medication.  It was noted that she is deconditioned at baseline, which was likely contributing to some of her symptoms of shortness of breath and near syncope. EKG at that time showed NSR, heart rate 68 bpm, RBBB, LVH with repolarization abnormality.  On 04/25/2018, the patient reported to [facility] ED with complaint of progressive shortness of breath x2-3d. She noted worsening SOB/DOE on Friday 12/13, specifically becoming SOB when ambulating from room to room with the walker she uses at home. On Saturday 12/14, she reportedly woke up with significant SOB. On 12/15, she became SOB on her way to the restroom. She reported she felt that lately using her CPAP overnight had made her symptoms worse. On 12/15, patient reportedly used 3 nebulizer treatments at home without improvement after becoming significantly SOB and called for her husband then decided to report to [facility]. She reported associated pleuritic, sharp, and left sided chest pain. She rated this chest pain 10/10 and lasting until admission.  On further questioning, it was noted that this chest pain appeared closer to chest tightness and associated with difficulty breathing.  She also c/o tremors which she has reported as chronic.  She used 1 spray of nitro at home with EMS present.  She denied any history of recent abdominal pain, nausea, emesis, or diarrhea.  No recent travel or trauma. Patient did report recent sick contacts and at the [facility] hospital when with her husband. She stated this occurred within the last couple of weeks with associated feelings of fever at presentation to the ED. She also reported a non-productive cough. Of  note, patient does continue to use a CPAP at home, reporting it sometimes helps her breathing.  In the ED 12/15, she was found to be tachypneic, hypertensive, hypokalemic, hyperglycemic, and anemic. BNP under 500. Troponin elevation trivial/borderline at 0.03 and flat trending since admission. Vitals: BP 184/74, HR 82 bpm, RR 25, SPO2 96%, T 90 8.27F Labs: Sodium 138, potassium 3.2, glucose 180, creatinine 1.18 (with baseline reportedly Cr 1.0-4.5), BUN 15, albumin 3.5, alk phosphatase 61, AST 22, ALT 15, BNP 282.0, troponin 0 0.03, WBC 7.8, RBC 3.68, hemoglobin 10.4, hematocrit 33.2, MCV 90.2, platelets 355, TSH 0.758 EKG: NSR, 84 bpm, LAD, IVCD, LVH with repolarization abnormality, RBB, 1st degree AV block CXR: Cardiomegaly, interstitial prominence suggest pulmonary edema superimposed on chronic interstitial changes.  Small right pleural effusion.  Right lung base atelectasis. Meds: Placed on nasal cannula, IV 125 mg Solu-Medrol, 40 mg IV Lasix for diuresis with good urine output and some alleviation of SOB  Since admission, patient reported initial improvement in SOB with worsening overnight 12/15-12/16 d/t the hospital CPAP. No current chest pain but multiple complaints on ROS including HA, cough, and itching after taking gabapentin and as below in ROS. No recent diarrhea. Of note, patient also increased her intake of fluid  recently with subsequent increase in abdominal fluid / swelling.   Past Medical History:  Diagnosis Date  . Acute respiratory failure ([place]) 11/21/2014  . Anxiety   . Arthritis 08/05/2013  . Atherosclerotic peripheral vascular disease with gangrene [facility]) august 2012  . Cardiomyopathy, ischemic    a. EF 35 to 40% by echo in 2013 b. EF improved to 50-55% by echo in 04/2015; 11/2016 Echo: EF 55-60%, Gr1 DD, mildly dil LA, nl RV fxn.  . Cerebral infarct ([place]) 08/17/2013  . Chronic diastolic CHF (congestive heart failure) (HCC)    a. EF 50-55% by echo in 04/2015; b. 11/2016 Echo: EF  55-60%, Gr1 DD.  [name] COPD (chronic obstructive pulmonary disease) ([place])   . Coronary artery disease, occlusive    a. Previous PCI to the LAD, LCx, and RCA in 2010, 2011, 2013, and 2016. All performed in [place]; b. 11/2016 Lexiscan MV: EF 43%, no ischemia->low risk.  . 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 Diabetic diarrhea ([place]) 10/03/2014  . DM type 2, uncontrolled, with renal complications ([place]) 07/10/4968  . Hepatic steatosis    by CT abd pelvis  . History of kidney stones    at a younger age  . Hyperlipidemia LDL goal <100 02/23/2014  . Hypertensive heart disease   . Osteoporosis, post-menopausal   . 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 Pill esophagitis   . Pleural effusion 10/25/2012   10/2012 CT chest >> small to moderate R lung effusion>> chylothorax, 100% lymphs 10/2013 thoracentesis> cytology negative, WBC 1471, > 90% "small lymphs" 01/2014 CT chest> near complete resolution of pleural effusion, stable lymphadenopathy 08/2014 CT chest [place] ([facility]): small right sided effusion decreased in size, stable mediastinal lymphadenopathy 1.0cm largest, 14m . Pulmonary sarcoidosis ([place] 12/07/2012   [place] 03/2013 Full PFT [facility] > UNACCEPTABLE AND NOT REPRODUCIBLE DATA> Ratio 71% FEV 1 1.02 L (55% pred), FVC 1.31 L (49% pred) could not do lung volumes or DLCO   . Sleep apnea   . Stage III chronic kidney disease ([facility]     Past Surgical History:  Procedure Laterality Date  . ABDOMINAL HYSTERECTOMY     at ge 39. secondary to bleeding/partial  . ANTERIOR CERVICAL DECOMP/DISCECTOMY FUSION N/A 10/10/2016   Procedure: Anterior Cervical Discectomy Fusion - Cervical four4- five - Cervical five-Cervical six - Cervical six-Cervical seven;  Surgeon: PEarnie Larsson MD;  [place]  Service: Neurosurgery;  Laterality: N/A;  . BREAST  CYST ASPIRATION Right   . CARPAL TUNNEL RELEASE Bilateral   . [place]  . COLONOSCOPY WITH PROPOFOL N/A 01/09/2015   Procedure: COLONOSCOPY WITH PROPOFOL;  Surgeon: DLucilla Lame MD;  Location: [facility] ENDOSCOPY;  Service: Endoscopy;  Laterality: N/A;  . CORONARY ANGIOPLASTY WITH ST[place]ACEMENT     [place] [facility]  . EYE SURGERY     bil cataracts  . HERNIA REPAIR     umbilical/ Dr EPat Patrick . OOPHORECTOMY    . PTCA  August 2012   Right Posterior tibial artery , [place]  . TOE AMPUTATION  Sept 2012   Right 2nd toe, [place]     Home Medications:  Prior to Admission medications   Medication Sig Start Date End Date Taking? Authorizing Provider  acetaminophen (TYLENOL) 500 MG tablet Take  1,000 mg by mouth every 6 (six) hours as needed for mild pain or headache.    Yes [provider]  allopurinol (ZYLOPRIM) 100 MG tablet Take 1 tablet (100 mg total) by mouth daily. 03/22/18 03/22/19 Yes Wieting, Richard, MD  ALPRAZolam [name]) 0.25 MG tablet Take 1 tablet (0.25 mg total) by mouth daily. 03/22/18  Yes Wieting, Richard, MD  amLODipine (NORVASC) 5 MG tablet Take 1 tablet (5 mg total) by mouth daily. 04/16/18  Yes [name], MD  aspirin EC 81 MG tablet Take 81 mg by mouth daily.   Yes [provider]  dicyclomine (BENTYL) 10 MG capsule Take 1 capsule (10 mg total) by mouth 4 (four) times daily -  before meals and at bedtime. 03/10/18  Yes [name], MD  gabapentin (NEURONTIN) 100 MG capsule Take 1 capsule (100 mg total) by mouth 3 (three) times daily. 04/16/18  Yes [name], MD  insulin aspart protamine- aspart (NOVOLOG MIX 70/30) (70-30) 100 UNIT/ML injection Inject 0.3 mLs (30 Units total) into the skin 2 (two) times daily with a meal. 03/22/18  Yes Wieting, Richard, MD  isosorbide mononitrate (IMDUR) 30 MG 24 hr tablet Take 1 tablet (30 mg total) by mouth daily. 03/03/18  Yes Gollan, Kathlene November, MD    mometasone-formoterol (DULERA) 200-5 MCG/ACT AERO Inhale 2 puffs into the lungs 2 (two) times daily. 02/01/18  Yes [name], MD  rosuvastatin (CRESTOR) 20 MG tablet Take 1 tablet (20 mg total) by mouth daily. 04/16/18  Yes [name], MD  ticagrelor (BRILINTA) 60 MG TABS tablet Take 1 tablet (60 mg total) by mouth 2 (two) times daily. 04/22/18  Yes Minna Merritts, MD  B-D UF III MINI PEN NEEDLES 31G X 5 MM MISC USE THREE TIMES A DAY 02/18/18   [name], MD  blood glucose meter kit and supplies Dispense based on patient and insurance preference. Use up to three times daily as directed. (FOR ICD-10 E11.21) 02/18/18   [name], MD  diphenoxylate-atropine (LOMOTIL) 2.5-0.025 MG tablet Take 1 tablet by mouth 4 (four) times daily as needed for diarrhea or loose stools. 03/22/18   Loletha Grayer, MD  feeding supplement, GLUCERNA SHAKE, (GLUCERNA SHAKE) LIQD Take 237 mLs by mouth 2 (two) times daily between meals. 05/27/17   Gouru, Illene Silver, MD  fluticasone (FLONASE) 50 MCG/ACT nasal spray Place 2 sprays into both nostrils daily as needed for rhinitis. 01/20/18   [name], MD  glucose blood (ACCU-CHEK AVIVA PLUS) test strip USE TO CHECK GLUCOSE THREE TIMES DAILY 02/16/18   [name], MD  glucose blood test strip Use to check blood sugars 2 times daily 02/16/18   [name], MD  nitroGLYCERIN (NITROLINGUAL) 0.4 MG/SPRAY spray Place 1 spray under the tongue every 5 (five) minutes x 3 doses as needed for chest pain. 01/14/17   [name], MD    Inpatient Medications: Scheduled Meds: . allopurinol  100 mg Oral Daily  . ALPRAZolam  0.25 mg Oral Daily  . amLODipine  10 mg Oral Daily  . aspirin EC  81 mg Oral Daily  . dicyclomine  10 mg Oral TID AC & HS  . docusate sodium  100 mg Oral BID  . feeding supplement (GLUCERNA SHAKE)  237 mL Oral BID BM  . furosemide  40 mg Intravenous Q12H  . gabapentin  100 mg Oral TID  . heparin  5,000 Units Subcutaneous  Q8H  . insulin aspart  0-15  Units Subcutaneous TID AC & HS  . insulin glargine  24 Units Subcutaneous QHS  . ipratropium-albuterol  3 mL Nebulization Q6H  . isosorbide mononitrate  30 mg Oral Daily  . methylPREDNISolone (SOLU-MEDROL) injection  60 mg Intravenous Q12H  . mometasone-formoterol  2 puff Inhalation BID  . nitroGLYCERIN  0.5 inch Topical Q6H  . rosuvastatin  20 mg Oral Daily  . ticagrelor  60 mg Oral BID   Continuous Infusions:  PRN Meds: acetaminophen **OR** acetaminophen, diphenoxylate-atropine, fluticasone, guaiFENesin-dextromethorphan, hydrALAZINE, nitroGLYCERIN, ondansetron **OR** ondansetron (ZOFRAN) IV  Allergies:    Allergies  Allergen Reactions  . Cephalexin Itching  . Contrast Media [Iodinated Diagnostic Agents] Itching  . Doxycycline Itching  . Gabapentin Itching  . Sulfa Antibiotics Itching    Social History:   Social History   Socioeconomic History  . Marital status: Married    Spouse name: Not on file  . Number of children: Not on file  . Years of education: Not on file  . Highest education level: Not on file  Occupational History  . Occupation: retired  Scientific laboratory technician  . Financial resource strain: Not on file  . Food insecurity:    Worry: Not on file    Inability: Not on file  . Transportation needs:    Medical: Not on file    Non-medical: Not on file  Tobacco Use  . Smoking status: Never Smoker  . Smokeless tobacco: Never Used  Substance and Sexual Activity  . Alcohol use: No    Alcohol/week: 0.0 standard drinks  . Drug use: No  . Sexual activity: Never  Lifestyle  . Physical activity:    Days per week: Not on file    Minutes per session: Not on file  . Stress: Not on file  Relationships  . Social connections:    Talks on phone: Not on file    Gets together: Not on file    Attends religious service: Not on file    Active member of club or organization: Not on file    Attends meetings of clubs or organizations: Not on file     Relationship status: Not on file  . Intimate partner violence:    Fear of current or ex partner: Not on file    Emotionally abused: Not on file    Physically abused: Not on file    Forced sexual activity: Not on file  Other Topics Concern  . Not on file  Social History Narrative  . Not on file    Family History:    Family History  Problem Relation Age of Onset  . Heart disease Father   . Heart attack Father   . Heart disease Sister   . Heart attack Sister   . Heart disease Brother   . Heart attack Brother   . Tremor Mother   . Asthma Grandchild   . Breast cancer Maternal Aunt      ROS:  Please see the history of present illness.  Review of Systems  Constitutional: Positive for fever and malaise/fatigue. Negative for weight loss.       At admission, "feverish," but resolved today 12/16  HENT: Positive for congestion. Negative for sinus pain and sore throat.   Respiratory: Positive for cough and shortness of breath. Negative for hemoptysis and sputum production.        Dry cough  Cardiovascular: Positive for chest pain and orthopnea. Negative for palpitations and leg swelling.       Chest pain described as  chest tightness on further questioning  Gastrointestinal: Negative for abdominal pain, blood in stool, constipation, diarrhea, heartburn, melena, nausea and vomiting.  Musculoskeletal: Positive for myalgias.  Skin: Positive for itching.       Reported d/t gabapentin intolerance and recently given gabapentin  Neurological: Positive for tremors, weakness and headaches. Negative for loss of consciousness.       Chronic HA since fall; chronic tremor  Psychiatric/Behavioral: Positive for memory loss. Negative for substance abuse.    All other ROS reviewed and negative.     Physical Exam/Data:   Vitals:   04/26/18 0156 04/26/18 0233 04/26/18 0358 04/26/18 0748  BP:  (!) 169/65 (!) 163/66 (!) 164/63  Pulse:  85 79 80  Resp:    20  Temp:    98 F (36.7 C)  TempSrc:     Oral  SpO2: 98% 96% 96% 99%  Weight:   83.9 kg   Height:        Intake/Output Summary (Last 24 hours) at 04/26/2018 1020 Last data filed at 04/26/2018 0900 Gross per 24 hour  Intake -  Output 1400 ml  Net -1400 ml   Filed Weights   04/25/18 0122 04/25/18 0646 04/26/18 0358  Weight: 88.5 kg 84.5 kg 83.9 kg   Body mass index is 28.97 kg/m.  General:  Obese AA female, tearful HEENT: normal. On [place] Neck: JVD difficult to assess d/t body habitus Vascular: No carotid bruits; FA pulses 2+ bilaterally without bruits  Cardiac:  normal S1, S2; RRR; no murmur  Lungs:  Coarse, reduced bibasilar breath sounds worse at right base. Abd: distended. Not TTP Ext: no significant LEE. On SCDs Musculoskeletal:  No deformities Skin: warm and dry  Neuro:   no focal abnormalities noted Psych:  Tearful  EKG:  The EKG was personally reviewed and demonstrates:  As in HPI -84 bpm, LAD, IVCD, LVH with repolarization abnormality, RBB, 1st degree AV block Telemetry:  Telemetry was personally reviewed and demonstrates:  SR, 73bpm  Relevant CV Studies:  05/24/2017 TTE Study Conclusions - Left ventricle: The cavity size was normal. There was mild to   moderate concentric hypertrophy. Systolic function was normal.   The estimated ejection fraction was in the range of 60% to 65%.   Wall motion was normal; there were no regional wall motion   abnormalities. Features are consistent with a pseudonormal left   ventricular filling pattern, with concomitant abnormal relaxation   and increased filling pressure (grade 2 diastolic dysfunction). - Mitral valve: Calcified annulus. - Left atrium: The atrium was normal in size. - Right ventricle: Systolic function was normal. - Pulmonary arteries: Systolic pressure was within the normal   range.  01/29/2016 NM Study Pharmacological myocardial perfusion imaging study with no significant  ischemia Normal wall motion, EF estimated at 55% No EKG changes concerning  for ischemia at peak stress or in recovery. Baseline EKG with IVCD and LAFB Low risk scan  05/2011 Cath Cardiac catheterization from January 2013 documenting ejection fraction 35-40%, 40% mid LAD, 50% distal LAD, small diagonal with 70% disease, 50% proximal left circumflex, 75% distal left circumflex with patent stent, small RCA, calcified, severe atherosclerosis, 90% distal RCA  *Details from catheterization March 2016 are not available, request made to hospital in [place] on today's visit for records  Laboratory Data:  Chemistry Recent Labs  Lab 04/25/18 0130 04/26/18 0909  NA 138 134*  K 3.2* 3.9  CL 106 100  CO2 24 24  GLUCOSE 180* 347*  BUN  15 29*  CREATININE 1.18* 1.43*  CALCIUM 9.9 10.1  GFRNONAA 44* 35*  GFRAA 52* 41*  ANIONGAP 8 10    Recent Labs  Lab 04/25/18 0130  PROT 7.5  ALBUMIN 3.5  AST 22  ALT 15  ALKPHOS 61  BILITOT 0.6   Hematology Recent Labs  Lab 04/25/18 0130  WBC 7.8  RBC 3.68*  HGB 10.4*  HCT 33.2*  MCV 90.2  MCH 28.3  MCHC 31.3  RDW 15.4  PLT 355   Cardiac Enzymes Recent Labs  Lab 04/25/18 0130 04/25/18 0743 04/25/18 1325 04/25/18 1820  TROPONINI 0.03* 0.03* 0.03* 0.03*   No results for input(s): TROPIPOC in the last 168 hours.  BNP Recent Labs  Lab 04/25/18 0130  BNP 282.0*    DDimer No results for input(s): DDIMER in the last 168 hours.  Radiology/Studies:  Dg Chest Port 1 View  Result Date: 04/25/2018 CLINICAL DATA:  Shortness of breath. EXAM: PORTABLE CHEST 1 VIEW COMPARISON:  Chest radiograph March 19, 2018 FINDINGS: Cardiac silhouette is mildly enlarged. Calcified aortic arch. Low inspiratory examination with increased interstitial prominence. New bandlike density RIGHT lung base with small RIGHT pleural effusion tracking to major fissure. Calcified mediastinal lymph nodes. No pneumothorax. ACDF. Old LEFT rib fractures. IMPRESSION: 1. Cardiomegaly. Interstitial prominence suggest pulmonary edema superimposed  on chronic interstitial changes. 2. Small RIGHT pleural effusion.  RIGHT lung base atelectasis. Electronically Signed   By: Elon Alas M.D.   On: 04/25/2018 02:16    Assessment and Plan:   Acute on Chronic HFpEF - Abdominal distention reported by patient with weight increase at presentation consistent with volume overload though not significantly volume overloaded on exam. Per patient, increased recent intake of fluid. Presenting with progressive SOB.  Of note, lasix and ARB stopped late November admission d/t AKI with lasix restarted in the ED 12/15. Etiology of shortness of breath likely multifactorial in the setting of HFpEF, anemia, known COPD, and pulmonary sarcoidosis. CXR above showed possibly pulmonary edema superimposed on chronic interstitial changes. Of note, BNP at admission under 500 and not significantly elevated at presentation.  - Previous 05/2017 echo with normal LVEF; consistent with pseudo-normal left ventricular filling pattern and G2DD.  Calcified annulus of mitral valve. Will update echo.  - Medical management recommended with continued diuresis and close monitoring of renal function and electrolytes. Consider decreasing IV lasix to from IV 57m q12h to 275mq12h given bump in renal function with goal of transitioning from IV to oral diuresis with low dose lasix by discharge. Of note, lasix dosage before it was discontinued reportedly po lasix 2078mnd every other day. Continue nebulizer and breathing treatments as needed for ongoing shortness of breath.  Recommend weaning off steroids if possible. [place] as needed for SOB. Consider also consulting pulmonology as below. - Daily BMET to monitor renal function and electrolytes.  Most recent creatinine 1.43 and elevated from yesterday's at 1.18.  Potassium improved from 3.2   3.9 with goal 4.0. Consider checking Mg. Monitor I/O, daily weights. Weight loss documented since admission at 195 lbs  184.97 lbs. Of note, weight at last office  visit last week documented as 188lbs. Net diuresis for admission almost 2L at -1890cc. - Pending echo.  CAD with h/o PCI   - No current CP. Reported CP at admission that, on further questioning, seems closer to chest tightness d/t SOB and difficulty breathing. Cath and PCI history as above. Troponin borderline / trivial elevated and flat trending. On DVT prophylaxis.  -  Continue medical manage with ASA and Brilinta 30m daily, Imdur 330mdaily.  Sublingual nitroglycerin as needed. ARB discontinued at most recent hospitalization - recommend restart ARB given comorbid HTN, DM2 with CAD and recovery of renal function with follow-up labs in clinic to monitor renal function and electrolytes.  HTN  -Uncontrolled at presentation. Most recent SBP 160s, HR 80s. Pending updated echo as known ICM -Continue medical management with amlodipine, Imdur, hydralazine added at ED with titration as needed for optimal medical therapy. Recommend restarting ARB and low dosage po lasix at discharge as above for additional BP control given renal function remains close to baseline.  - Continue to monitor vitals. Recommend home monitoring of BP and follow-up labs if restart ARB/lasix to monitor renal function and electrolytes. Per IM with recommendation for PCP follow-up to ensure control following discharge.   HLD - 11/2016 LDL 60 and at goal. Recommend outpatient update of LDL. Continue statin therapy with crestor 2042maily  CKDIII - As above - continue to monitor renal function and electrolytes with daily BMET  No further episodes of dehydration / diarrhea since last clinic visit. Renal function improved since last admission as above with slight bump overnight. Consider reducing IV diuresis and transitioning to oral diuretic as above to avoid further increase in Cr.   Anemia - Likely contributing to SOB. 10.4 at admission with baseline 10.5-11.0. Recommend continue to monitor with daily CBC. Per IM  DM2 -  Hyperglycemic at presentation. Currently glucose 324 and uncontrolled. Agree with consult to dietician, SSI. Further recommendations per IM with recommendation for PCP follow-up for optimization of medical therapy following discharge.   COPD -Likely contributing to reported SOB. Continue breathing treatments, [place] O2, CPAP as tolerated. Patient reporting that CPAP currently makes her breathing worse. Recommend wean off steroids when possible. Consider consulting pulmonology. Further recommendations per IM. Recommend continue to use home CPAP as tolerated for breathing.  OSA - Consider adjustment of CPAP given c/o inducing SOB. Per IM, PCP.  For questions or updates, please contact [place] consult [email or web address] for contact info under     Signed, JacArvil ChacoA-C  04/26/2018 10:20 AM

## 2018-04-26 NOTE — Progress Notes (Signed)
Patient's CBG 410 pre-lunch. Dr. Posey Pronto notified, order for 20 units of subq novolog one time instead of sliding scale order.

## 2018-04-26 NOTE — Progress Notes (Addendum)
Inpatient Diabetes Program Recommendations  AACE/ADA: New Consensus Statement on Inpatient Glycemic Control (2015)  Target Ranges:  Prepandial:   less than 140 mg/dL      Peak postprandial:   less than 180 mg/dL (1-2 hours)      Critically ill patients:  140 - 180 mg/dL   Lab Results  Component Value Date   GLUCAP 410 (H) 04/26/2018   HGBA1C 7.7 (H) 02/10/2018    Review of Glycemic Control Results for NELLA, BOTSFORD (MRN [id number]) as of 04/26/2018 12:59  Ref. Range 04/25/2018 16:52 04/25/2018 20:40 04/26/2018 07:51 04/26/2018 11:31  Glucose-Capillary Latest Ref Range: 70 - 99 mg/dL 322 (H) 330 (H) 327 (H) 410 (H)   Diabetes history: DM 2 Outpatient Diabetes medications: Novolog 70/30 30 units bid Current orders for Inpatient glycemic control:  Novolog moderate tid with meals and HS, Lantus 24 units q HS Solumedrol 60 mg IV q 12 hours Inpatient Diabetes Program Recommendations:   Please consider increasing Lantus to 40 units q HS.  Also please add Novolog meal coverage 5 units tid with meals.   Thanks,  Adah Perl, RN, BC-ADM Inpatient Diabetes Coordinator Pager [phone] (8a-5p)

## 2018-04-26 NOTE — Progress Notes (Signed)
Patient states she only wears her CPAP for 6 hours at night. 0200 Breathing treatment given, patient declines to put CPAP back on. Clear diminished breath sounds, 98% SAT on 2L [place].

## 2018-04-26 NOTE — Progress Notes (Signed)
[facility]                                                                                                                                                                                  Patient Demographics   Pamela Collier, is a 77 y.o. female, DOB - 09-10-1940, GPQ:[id number]  Admit date - 04/25/2018   Admitting Physician Harrie Foreman, MD  Outpatient Primary MD for the patient is [name], MD   LOS - 0  Subjective: Patient continues to complain of shortness of breath states that she could not breathe last night   Review of Systems:   CONSTITUTIONAL: No documented fever. No fatigue, weakness. No weight gain, no weight loss.  EYES: No blurry or double vision.  ENT: No tinnitus. No postnasal drip. No redness of the oropharynx.  RESPIRATORY: No cough, no wheeze, no hemoptysis.  Positive dyspnea.  CARDIOVASCULAR: No chest pain. No orthopnea. No palpitations. No syncope.  GASTROINTESTINAL: No nausea, no vomiting or diarrhea. No abdominal pain. No melena or hematochezia.  GENITOURINARY: No dysuria or hematuria.  ENDOCRINE: No polyuria or nocturia. No heat or cold intolerance.  HEMATOLOGY: No anemia. No bruising. No bleeding.  INTEGUMENTARY: No rashes. No lesions.  MUSCULOSKELETAL: No arthritis. No swelling. No gout.  NEUROLOGIC: No numbness, tingling, or ataxia. No seizure-type activity.  PSYCHIATRIC: No anxiety. No insomnia. No ADD.    Vitals:   Vitals:   04/26/18 0233 04/26/18 0358 04/26/18 0748 04/26/18 1230  BP: (!) 169/65 (!) 163/66 (!) 164/63 (!) 144/53  Pulse: 85 79 80 79  Resp:   20   Temp:   98 F (36.7 C)   TempSrc:   Oral   SpO2: 96% 96% 99%   Weight:  83.9 kg    Height:        Wt Readings from Last 3 Encounters:  04/26/18 83.9 kg  04/23/18 84.8 kg  04/22/18 85.5 kg     Intake/Output Summary (Last 24 hours) at 04/26/2018 1420 Last data filed at 04/26/2018 1033 Gross per 24 hour  Intake 360 ml   Output 1400 ml  Net -1040 ml    Physical Exam:   GENERAL: Pleasant-appearing in no apparent distress.  HEAD, EYES, EARS, NOSE AND THROAT: Atraumatic, normocephalic. Extraocular muscles are intact. Pupils equal and reactive to light. Sclerae anicteric. No conjunctival injection. No oro-pharyngeal erythema.  NECK: Supple. There is no jugular venous distention. No bruits, no lymphadenopathy, no thyromegaly.  HEART: Regular rate and rhythm,. No murmurs, no rubs, no clicks.  LUNGS: Bilateral crackles throughout both lung ABDOMEN: Soft, flat, nontender, nondistended. Has good bowel sounds. No hepatosplenomegaly appreciated.  EXTREMITIES: No evidence  of any cyanosis, clubbing, or peripheral edema.  +2 pedal and radial pulses bilaterally.  NEUROLOGIC: The patient is alert, awake, and oriented x3 with no focal motor or sensory deficits appreciated bilaterally.  SKIN: Moist and warm with no rashes appreciated.  Psych: Not anxious, depressed LN: No inguinal LN enlargement    Antibiotics   Anti-infectives (From admission, onward)   None      Medications   Scheduled Meds: . allopurinol  100 mg Oral Daily  . ALPRAZolam  0.25 mg Oral Daily  . amLODipine  10 mg Oral Daily  . aspirin EC  81 mg Oral Daily  . dicyclomine  10 mg Oral TID AC & HS  . docusate sodium  100 mg Oral BID  . feeding supplement (GLUCERNA SHAKE)  237 mL Oral BID BM  . furosemide  40 mg Intravenous Q12H  . heparin  5,000 Units Subcutaneous Q8H  . insulin aspart  0-15 Units Subcutaneous TID AC & HS  . insulin glargine  24 Units Subcutaneous QHS  . ipratropium-albuterol  3 mL Nebulization Q6H  . isosorbide mononitrate  30 mg Oral Daily  . methylPREDNISolone (SOLU-MEDROL) injection  60 mg Intravenous Q12H  . mometasone-formoterol  2 puff Inhalation BID  . nitroGLYCERIN  0.5 inch Topical Q6H  . protein supplement shake  11 oz Oral BID BM  . rosuvastatin  20 mg Oral Daily  . ticagrelor  60 mg Oral BID   Continuous  Infusions: PRN Meds:.acetaminophen **OR** acetaminophen, diphenhydrAMINE, diphenoxylate-atropine, fluticasone, guaiFENesin-dextromethorphan, hydrALAZINE, nitroGLYCERIN, ondansetron **OR** ondansetron (ZOFRAN) IV   Data Review:   Micro Results No results found for this or any previous visit (from the past 240 hour(s)).  Radiology Reports Dg Chest Port 1 View  Result Date: 04/25/2018 CLINICAL DATA:  Shortness of breath. EXAM: PORTABLE CHEST 1 VIEW COMPARISON:  Chest radiograph March 19, 2018 FINDINGS: Cardiac silhouette is mildly enlarged. Calcified aortic arch. Low inspiratory examination with increased interstitial prominence. New bandlike density RIGHT lung base with small RIGHT pleural effusion tracking to major fissure. Calcified mediastinal lymph nodes. No pneumothorax. ACDF. Old LEFT rib fractures. IMPRESSION: 1. Cardiomegaly. Interstitial prominence suggest pulmonary edema superimposed on chronic interstitial changes. 2. Small RIGHT pleural effusion.  RIGHT lung base atelectasis. Electronically Signed   By: Elon Alas M.D.   On: 04/25/2018 02:16     CBC Recent Labs  Lab 04/25/18 0130  WBC 7.8  HGB 10.4*  HCT 33.2*  PLT 355  MCV 90.2  MCH 28.3  MCHC 31.3  RDW 15.4  LYMPHSABS 1.2  MONOABS 0.7  EOSABS 0.2  BASOSABS 0.1    Chemistries  Recent Labs  Lab 04/25/18 0130 04/26/18 0909  NA 138 134*  K 3.2* 3.9  CL 106 100  CO2 24 24  GLUCOSE 180* 347*  BUN 15 29*  CREATININE 1.18* 1.43*  CALCIUM 9.9 10.1  AST 22  --   ALT 15  --   ALKPHOS 61  --   BILITOT 0.6  --    ------------------------------------------------------------------------------------------------------------------ estimated creatinine clearance is 36.7 mL/min (A) (by C-G formula based on SCr of 1.43 mg/dL (H)). ------------------------------------------------------------------------------------------------------------------ No results for input(s): HGBA1C in the last 72  hours. ------------------------------------------------------------------------------------------------------------------ No results for input(s): CHOL, HDL, LDLCALC, TRIG, CHOLHDL, LDLDIRECT in the last 72 hours. ------------------------------------------------------------------------------------------------------------------ Recent Labs    04/25/18 0743  TSH 0.758   ------------------------------------------------------------------------------------------------------------------ No results for input(s): VITAMINB12, FOLATE, FERRITIN, TIBC, IRON, RETICCTPCT in the last 72 hours.  Coagulation profile No results for input(s): INR, PROTIME in  the last 168 hours.  No results for input(s): DDIMER in the last 72 hours.  Cardiac Enzymes Recent Labs  Lab 04/25/18 0743 04/25/18 1325 04/25/18 1820  TROPONINI 0.03* 0.03* 0.03*   ------------------------------------------------------------------------------------------------------------------ Invalid input(s): POCBNP    Assessment & Plan   This is a 77 year old female admitted for CHF exacerbation. 1.  CHF: Acute on chronic; diastolic.    Continue IV Lasix continues to be symptomatic I will ask her cardiologist to see 2.  COPD: With mild exacerbation as there is some wheezing.  Contributes to respiratory distress.    Continue Solu-Medrol and neb 3.  CAD: Stable; continue aspirin and Brilinta.  Continue Imdur 4.  Hypertension: Uncontrolled; I have applied Nitropaste to the chest in the emergency department.  Continue amlodipine.  Add hydralazine.  PRN 5.  Diabetes mellitus type 2: Continue basal insulin as well as sliding scale 6.  Hyperlipidemia: Continue statin therapy 7.  DVT prophylaxis: Heparin 8.  GI prophylaxis: None      Code Status Orders  (From admission, onward)         Start     Ordered   04/25/18 0647  Full code  Continuous     04/25/18 0646        Code Status History    Date Active Date Inactive Code Status  Order ID Comments User Context   06/04/2017 0151 06/04/2017 1944 Full Code [id number]  Lance Coon, MD Inpatient   05/24/2017 0129 05/26/2017 1926 Full Code [id number]  Lance Coon, MD Inpatient   11/14/2016 2249 11/15/2016 [place] Full Code [id number]  [place], [place], DO ED   10/10/2016 1325 10/13/2016 2020 Full Code [id number]  Earnie Larsson, MD Inpatient   06/11/2016 2142 06/12/2016 1923 Full Code [id number]  [place], [place], DO ED   01/02/2016 0524 01/02/2016 0914 Full Code [id number]  Saundra Shelling, MD Inpatient   06/26/2015 0736 06/29/2015 1954 Full Code [id number]  Demetrios Loll, MD ED   04/20/2015 0118 04/22/2015 1918 Full Code [id number]  Lance Coon, MD Inpatient           Consults none  DVT Prophylaxis  Lovenox   Lab Results  Component Value Date   PLT 355 04/25/2018     Time Spent in minutes   35 minutes spent, 12:30 PM to 1:30 PM  greater than 50% of time spent in care coordination and counseling patient regarding the condition and plan of care.   Dustin Flock M.D on 04/26/2018 at 2:20 PM  Between 7am to 6pm - Pager - [phone]  After 6pm go to [email or web address] - Proofreader  [employer]   Office  [phone]

## 2018-04-26 NOTE — Progress Notes (Signed)
CBG 420. Dr. Posey Pronto notified. Instructed to give 15 units sliding scale and 5 units meal coverage as ordered.

## 2018-04-26 NOTE — Plan of Care (Signed)
Nutrition Education Note  RD consulted for nutrition education regarding CHF.  RD provided "Low Sodium Nutrition Therapy" handout from the Academy of [employer]. Reviewed patient's dietary recall. Provided examples on ways to decrease sodium intake in diet. Discouraged intake of processed foods and use of salt shaker. Encouraged fresh fruits and vegetables as well as whole grain sources of carbohydrates to maximize fiber intake.   RD discussed why it is important for patient to adhere to diet recommendations, and emphasized the role of fluids, foods to avoid, and importance of weighing self daily. Teach back method used.  Expect fair compliance.  Body mass index is 28.97 kg/m. Pt meets criteria for overweight based on current BMI.  Current diet order is HH/CHO modified, patient is consuming approximately 75-100% of meals at this time. Labs and medications reviewed. No further nutrition interventions warranted at this time. RD contact information provided. If additional nutrition issues arise, please re-consult RD.   Koleen Distance MS, RD, LDN Pager #- [phone] Office#- [phone] After Hours Pager: [phone]

## 2018-04-27 ENCOUNTER — Inpatient Hospital Stay (HOSPITAL_COMMUNITY)
Admit: 2018-04-27 | Discharge: 2018-04-27 | Disposition: A | Discharge status: Home / Self Care | Payer: Medicare Other | Attending: Physician Assistant | Admitting: Physician Assistant

## 2018-04-27 DIAGNOSIS — I2510 Atherosclerotic heart disease of native coronary artery without angina pectoris: Secondary | ICD-10-CM | POA: Diagnosis present

## 2018-04-27 DIAGNOSIS — Z7401 Bed confinement status: Secondary | ICD-10-CM | POA: Diagnosis not present

## 2018-04-27 DIAGNOSIS — I5033 Acute on chronic diastolic (congestive) heart failure: Secondary | ICD-10-CM | POA: Diagnosis not present

## 2018-04-27 DIAGNOSIS — J9601 Acute respiratory failure with hypoxia: Secondary | ICD-10-CM | POA: Diagnosis present

## 2018-04-27 DIAGNOSIS — R0602 Shortness of breath: Secondary | ICD-10-CM | POA: Diagnosis not present

## 2018-04-27 DIAGNOSIS — Z6829 Body mass index (BMI) 29.0-29.9, adult: Secondary | ICD-10-CM | POA: Diagnosis not present

## 2018-04-27 DIAGNOSIS — M810 Age-related osteoporosis without current pathological fracture: Secondary | ICD-10-CM | POA: Diagnosis present

## 2018-04-27 DIAGNOSIS — M109 Gout, unspecified: Secondary | ICD-10-CM | POA: Diagnosis present

## 2018-04-27 DIAGNOSIS — N183 Chronic kidney disease, stage 3 (moderate): Secondary | ICD-10-CM | POA: Diagnosis present

## 2018-04-27 DIAGNOSIS — E785 Hyperlipidemia, unspecified: Secondary | ICD-10-CM | POA: Diagnosis present

## 2018-04-27 DIAGNOSIS — R251 Tremor, unspecified: Secondary | ICD-10-CM | POA: Diagnosis present

## 2018-04-27 DIAGNOSIS — I248 Other forms of acute ischemic heart disease: Secondary | ICD-10-CM | POA: Diagnosis present

## 2018-04-27 DIAGNOSIS — G4733 Obstructive sleep apnea (adult) (pediatric): Secondary | ICD-10-CM | POA: Diagnosis present

## 2018-04-27 DIAGNOSIS — M2550 Pain in unspecified joint: Secondary | ICD-10-CM | POA: Diagnosis not present

## 2018-04-27 DIAGNOSIS — Z7951 Long term (current) use of inhaled steroids: Secondary | ICD-10-CM | POA: Diagnosis not present

## 2018-04-27 DIAGNOSIS — F419 Anxiety disorder, unspecified: Secondary | ICD-10-CM | POA: Diagnosis present

## 2018-04-27 DIAGNOSIS — M6281 Muscle weakness (generalized): Secondary | ICD-10-CM | POA: Diagnosis not present

## 2018-04-27 DIAGNOSIS — R0789 Other chest pain: Secondary | ICD-10-CM | POA: Diagnosis not present

## 2018-04-27 DIAGNOSIS — E1165 Type 2 diabetes mellitus with hyperglycemia: Secondary | ICD-10-CM | POA: Diagnosis present

## 2018-04-27 DIAGNOSIS — E876 Hypokalemia: Secondary | ICD-10-CM | POA: Diagnosis present

## 2018-04-27 DIAGNOSIS — I10 Essential (primary) hypertension: Secondary | ICD-10-CM | POA: Diagnosis not present

## 2018-04-27 DIAGNOSIS — R0603 Acute respiratory distress: Secondary | ICD-10-CM | POA: Diagnosis not present

## 2018-04-27 DIAGNOSIS — D860 Sarcoidosis of lung: Secondary | ICD-10-CM | POA: Diagnosis present

## 2018-04-27 DIAGNOSIS — D631 Anemia in chronic kidney disease: Secondary | ICD-10-CM | POA: Diagnosis present

## 2018-04-27 DIAGNOSIS — E1122 Type 2 diabetes mellitus with diabetic chronic kidney disease: Secondary | ICD-10-CM | POA: Diagnosis present

## 2018-04-27 DIAGNOSIS — I509 Heart failure, unspecified: Secondary | ICD-10-CM | POA: Diagnosis not present

## 2018-04-27 DIAGNOSIS — J449 Chronic obstructive pulmonary disease, unspecified: Secondary | ICD-10-CM | POA: Diagnosis not present

## 2018-04-27 DIAGNOSIS — I255 Ischemic cardiomyopathy: Secondary | ICD-10-CM | POA: Diagnosis present

## 2018-04-27 DIAGNOSIS — J440 Chronic obstructive pulmonary disease with acute lower respiratory infection: Secondary | ICD-10-CM | POA: Diagnosis present

## 2018-04-27 DIAGNOSIS — R531 Weakness: Secondary | ICD-10-CM | POA: Diagnosis not present

## 2018-04-27 DIAGNOSIS — J441 Chronic obstructive pulmonary disease with (acute) exacerbation: Secondary | ICD-10-CM | POA: Diagnosis present

## 2018-04-27 DIAGNOSIS — E1140 Type 2 diabetes mellitus with diabetic neuropathy, unspecified: Secondary | ICD-10-CM | POA: Diagnosis not present

## 2018-04-27 DIAGNOSIS — E1151 Type 2 diabetes mellitus with diabetic peripheral angiopathy without gangrene: Secondary | ICD-10-CM | POA: Diagnosis present

## 2018-04-27 DIAGNOSIS — I130 Hypertensive heart and chronic kidney disease with heart failure and stage 1 through stage 4 chronic kidney disease, or unspecified chronic kidney disease: Secondary | ICD-10-CM | POA: Diagnosis present

## 2018-04-27 DIAGNOSIS — R41841 Cognitive communication deficit: Secondary | ICD-10-CM | POA: Diagnosis not present

## 2018-04-27 LAB — BASIC METABOLIC PANEL
Anion gap: 7 (ref 5–15)
BUN: 45 mg/dL — ABNORMAL HIGH (ref 8–23)
CO2: 28 mmol/L (ref 22–32)
CREATININE: 1.6 mg/dL — AB (ref 0.44–1.00)
Calcium: 10.1 mg/dL (ref 8.9–10.3)
Chloride: 101 mmol/L (ref 98–111)
GFR calc Af Amer: 36 mL/min — ABNORMAL LOW (ref >60)
GFR calc non Af Amer: 31 mL/min — ABNORMAL LOW (ref >60)
Glucose, Bld: 239 mg/dL — ABNORMAL HIGH (ref 70–99)
Potassium: 3.9 mmol/L (ref 3.5–5.1)
SODIUM: 136 mmol/L (ref 135–145)

## 2018-04-27 LAB — GLUCOSE, CAPILLARY
Glucose-Capillary: 211 mg/dL — ABNORMAL HIGH (ref 70–99)
Glucose-Capillary: 190 mg/dL — ABNORMAL HIGH (ref 70–99)
Glucose-Capillary: 411 mg/dL — ABNORMAL HIGH (ref 70–99)
Glucose-Capillary: 412 mg/dL — ABNORMAL HIGH (ref 70–99)
Glucose-Capillary: 401 mg/dL — ABNORMAL HIGH (ref 70–99)

## 2018-04-27 LAB — ECHOCARDIOGRAM COMPLETE
Height: 67 in
Weight: 3006.4 oz

## 2018-04-27 MED ORDER — HYDRALAZINE HCL 25 MG PO TABS
25.0000 mg | ORAL_TABLET | Freq: Three times a day (TID) | ORAL | Status: DC
  Administered 2018-04-27 – 2018-04-28 (×5): 25 mg via ORAL
  Filled 2018-04-27 (×5): qty 1

## 2018-04-27 MED ORDER — INSULIN GLARGINE 100 UNIT/ML ~~LOC~~ SOLN
50.0000 [IU] | Freq: Every day | SUBCUTANEOUS | Status: DC
  Administered 2018-04-27 – 2018-04-29 (×3): 50 [IU] via SUBCUTANEOUS
  Filled 2018-04-27 (×4): qty 0.5

## 2018-04-27 MED ORDER — PERFLUTREN LIPID MICROSPHERE
1.0000 mL | INTRAVENOUS | Status: AC | PRN
  Administered 2018-04-27: 2 mL via INTRAVENOUS
  Filled 2018-04-27: qty 10

## 2018-04-27 MED ORDER — MORPHINE SULFATE (PF) 2 MG/ML IV SOLN
1.0000 mg | INTRAVENOUS | Status: DC | PRN
  Administered 2018-04-27 (×2): 1 mg via INTRAVENOUS
  Filled 2018-04-27 (×2): qty 1

## 2018-04-27 NOTE — Progress Notes (Signed)
Progress Note  Patient [name] Date of Encounter: 04/27/2018  Primary Cardiologist: Ida Rogue, MD   Subjective   Patient reported pleuritic, left-sided chest pain this morning and attributed again to CPAP machine.  At the time of my examination, new nitro patch was placed.  No current chest pain, palpitations, or feeling of racing heart rate.  Patient reported improvement in respiratory status.  She also stated mild improvement in abdominal swelling.  She continues to report that she is "itchy all over" which she attributed to the soap by the sink.  Of note, yesterday she thought the itchiness might be attributed to the gabapentin.  She continues to exhibit baseline tremors, which she confirmed are chronic.  Inpatient Medications    Scheduled Meds: . allopurinol  100 mg Oral Daily  . ALPRAZolam  0.25 mg Oral Daily  . amLODipine  10 mg Oral Daily  . aspirin EC  81 mg Oral Daily  . dicyclomine  10 mg Oral TID AC & HS  . docusate sodium  100 mg Oral BID  . feeding supplement (GLUCERNA SHAKE)  237 mL Oral BID BM  . heparin  5,000 Units Subcutaneous Q8H  . hydrALAZINE  25 mg Oral Q8H  . insulin aspart  0-15 Units Subcutaneous TID AC & HS  . insulin aspart  5 Units Subcutaneous TID WC  . insulin glargine  40 Units Subcutaneous QHS  . ipratropium-albuterol  3 mL Nebulization Q6H  . isosorbide mononitrate  30 mg Oral Daily  . methylPREDNISolone (SOLU-MEDROL) injection  60 mg Intravenous Q24H  . mometasone-formoterol  2 puff Inhalation BID  . nitroGLYCERIN  0.5 inch Topical Q6H  . protein supplement shake  11 oz Oral BID BM  . rosuvastatin  20 mg Oral Daily  . ticagrelor  60 mg Oral BID   Continuous Infusions:  PRN Meds: acetaminophen **OR** acetaminophen, diphenhydrAMINE, diphenoxylate-atropine, fluticasone, guaiFENesin-dextromethorphan, hydrALAZINE, morphine injection, nitroGLYCERIN, ondansetron **OR** ondansetron (ZOFRAN) IV   Vital Signs    Vitals:   04/27/18 0524 04/27/18 0549 04/27/18 0730 04/27/18 0905  BP: (!) 174/66 (!) 166/66  (!) 155/96  Pulse: 78 75  77  Resp:    14  Temp:      TempSrc:      SpO2:   100% 99%  Weight:      Height:        Intake/Output Summary (Last 24 hours) at 04/27/2018 1055 Last data filed at 04/27/2018 1010 Gross per 24 hour  Intake 360 ml  Output 800 ml  Net -440 ml   Filed Weights   04/25/18 0646 04/26/18 0358 04/27/18 0445  Weight: 84.5 kg 83.9 kg 85.2 kg    Telemetry    SR, HR 80s-90s - Personally Reviewed  ECG    No new tracings- Personally Reviewed  Physical Exam   GEN:  Appeared agitated and in distress with complaint of itchiness all over Neck:  Occult to assess due to body habitus Cardiac: tachycardic, 1/6 systolic murmur murmurs, rubs, or gallops.  Respiratory: Diminished bibasilar breath sounds GI: Distended  MS: No edema; No deformity. On SCDs Neuro:  Nonfocal - chronic tremor Psych: Agitated  Labs    Chemistry Recent Labs  Lab 04/25/18 0130 04/26/18 0909 04/27/18 0525  NA 138 134* 136  K 3.2* 3.9 3.9  CL 106 100 101  CO2 24 24 28   GLUCOSE 180* 347* 239*  BUN 15 29* 45*  CREATININE 1.18* 1.43* 1.60*  CALCIUM 9.9 10.1 10.1  PROT 7.5  --   --  ALBUMIN 3.5  --   --   AST 22  --   --   ALT 15  --   --   ALKPHOS 61  --   --   BILITOT 0.6  --   --   GFRNONAA 44* 35* 31*  GFRAA 52* 41* 36*  ANIONGAP 8 10 7      Hematology Recent Labs  Lab 04/25/18 0130  WBC 7.8  RBC 3.68*  HGB 10.4*  HCT 33.2*  MCV 90.2  MCH 28.3  MCHC 31.3  RDW 15.4  PLT 355    Cardiac Enzymes Recent Labs  Lab 04/25/18 0130 04/25/18 0743 04/25/18 1325 04/25/18 1820  TROPONINI 0.03* 0.03* 0.03* 0.03*   No results for input(s): TROPIPOC in the last 168 hours.   BNP Recent Labs  Lab 04/25/18 0130  BNP 282.0*     DDimer No results for input(s): DDIMER in the last 168 hours.   Radiology    No results found.  Cardiac Studies   Pending updated echo performed  12/17  05/24/2017 TTE Study Conclusions - Left ventricle: The cavity size was normal. There was mild to moderate concentric hypertrophy. Systolic function was normal. The estimated ejection fraction was in the range of 60% to 65%. Wall motion was normal; there were no regional wall motion abnormalities. Features are consistent with a pseudonormal left ventricular filling pattern, with concomitant abnormal relaxation and increased filling pressure (grade 2 diastolic dysfunction). - Mitral valve: Calcified annulus. - Left atrium: The atrium was normal in size. - Right ventricle: Systolic function was normal. - Pulmonary arteries: Systolic pressure was within the normal range.  01/29/2016 NM Study Pharmacological myocardial perfusion imaging study with no significant ischemia Normal wall motion, EF estimated at 55% No EKG changes concerning for ischemia at peak stress or in recovery. Baseline EKG with IVCD and LAFB Low risk scan  05/2011 Cath Cardiac catheterization from January 2013 documenting ejection fraction 35-40%, 40% mid LAD, 50% distal LAD, small diagonal with 70% disease, 50% proximal left circumflex, 75% distal left circumflex with patent stent, small RCA, calcified, severe atherosclerosis, 90% distal RCA  *Details from catheterization March 2016 are not available, request made to hospital in [place] on today's visit for records  Patient Profile     77 y.o. female with history of CAD, ICM, HFpEF (EF 55 to 60%), hypertension, hyperlipidemia, diabetes, CKD stage III, pulmonary sarcoidosis, COPD not on home oxygen, peripheral vascular disease, depression, OSA on home CPAP, prior stroke who is being seen today for the evaluation of heart failure.  Assessment & Plan    Acute on Chronic HFpEF - In November 2019, chronic diuretic therapy was held due to AKI.  Toprol discontinued due to sinus pauses.  Reported worsening shortness of breathwit etiology ikely  multifactorial in the setting of HFpEF, anemia, known COPD, and pulmonary sarcoidosis. CXR - pulmonary edema superimposed on chronic interstitial changes. BNP at admission under 500 and not significantly elevated at presentation. Pending updated limited echo as previous TTE showed pericardial effusion and cardiac enlargement from previous CXR to current.  - Previous 05/2017 echo with normal LVEF; consistent with pseudo-normal left ventricular filling pattern and G2DD.  Calcified annulus of mitral valve. Pending updated limited echo to assess pericardial effusion.  - Daily BMET to monitor renal function and electrolytes. Cr continues to climb on diuresis with recommendation to hold for today. Potassium improved from 3.2   3.9 with goal 4.0 and will need recheck today. Consider checking Mg.  - Monitor  I/O, daily weights. Weight loss documented since admission at 195 lbs  184.97 lbs. Of note, weight at last office visit last week documented as 188lbs.  - Continue to hold po lasix given progressive increase in creatinine. Could consider dosage decrease with PTA lasix dosage (before it was discontinued) documented as po lasix 20mg  and every other day. Continue nebulizer and breathing treatments as needed for ongoing shortness of breath.  Recommend weaning off steroids if possible. [place] as needed for SOB. Consider also consulting pulmonology as below. Pending limited echo.  CAD with h/o PCI   - No current CP on nitro patch and described more as chest tightness, pleuritic. Cath and PCI history as above. Troponin borderline / trivial elevation and flat trending likely in setting of demand ishemia. On DVT prophylaxis.  - Continue medical manage with ASA and Brilinta 60mg  daily, Imdur 30mg  daily.  ARB discontinued at most recent hospitalization - with recovery of renal function, consider restart of ARB given comorbid HTN, DM2 with CAD & follow-up labs in clinic to monitor renal function and electrolytes.  HTN    -Uncontrolled at presentation and remains elevated. Pending updated echo as known ICM. -Continue medical management with amlodipine, Imdur, hydralazine added at ED with titration as needed for optimal medical therapy. Recommend restarting ARB once recovery and stabilization of renal function. Restart lower dosing diuretic once stable renal function for additional BP control given renal function remains close to baseline.  - Continue to monitor vitals. Recommend home monitoring of BP and follow-up labs if restart ARB/lasix to monitor renal function and electrolytes. Per IM with recommendation for PCP follow-up to ensure control following discharge.   HLD - 11/2016 LDL 60 and at goal. Recommend outpatient update of LDL. Continue statin therapy with crestor 20mg  daily  CKDIII - As above - continue to monitor renal function and electrolytes with daily BMET  No further episodes of dehydration / diarrhea since last clinic visit. Renal function with slight bump overnight after transitioning to oral diuretic. Will hold x1 day and reassess with BMET in AM   Anemia - Likely contributing to SOB. 10.4 at admission with baseline 10.5-11.0. Recommend continue to monitor with daily CBC. Pending today's CBC. Per IM  DM2 - Hyperglycemic at presentation and still uncontrolled. SSI. Further recommendations per IM with recommendation for PCP follow-up for optimization of medical therapy following discharge.   COPD -Likely contributing to reported SOB. Continue breathing treatments, [place] O2, CPAP as tolerated. Patient reporting that CPAP currently makes her breathing worse. Recommend wean off steroids when possible. Consider consulting pulmonology. Further recommendations per IM. Recommend continue to use home CPAP as tolerated for breathing.  OSA - Consider adjustment of CPAP given c/o inducing SOB. Per IM, PCP.   For questions or updates, please contact [place] Please consult [email or web address] for  contact info under        Signed, Arvil Chaco, PA-C  04/27/2018, 10:55 AM

## 2018-04-27 NOTE — Plan of Care (Signed)
  Problem: Health Behavior/Discharge Planning: Goal: Ability to manage health-related needs will improve Outcome: Not Progressing Note:  Patient has both uncontrolled B.S.'s and is c/o itching on back today. 20 units of insulin given at lunchtime, and family member may be going "into town" to pick up some special kind of sensitive skin soap for the patient to use when bathing here. Will continue to monitor general clinical progress. Wenda Low [facility]

## 2018-04-27 NOTE — Progress Notes (Signed)
[place] at [facility]                                                                                                                                                                                  Patient Demographics   Pamela Collier, is a 77 y.o. female, DOB - [birth year], ZOX:[id number]  Admit date - 04/25/2018   Admitting Physician Harrie Foreman, MD  Outpatient Primary MD for the patient is [name], MD   LOS - 0  Subjective: Complaining of persistent shortness of breath also complains of left-sided chest pain  Review of Systems:   CONSTITUTIONAL: No documented fever. No fatigue, weakness. No weight gain, no weight loss.  EYES: No blurry or double vision.  ENT: No tinnitus. No postnasal drip. No redness of the oropharynx.  RESPIRATORY: No cough, no wheeze, no hemoptysis.  Positive dyspnea.  CARDIOVASCULAR: Positive chest pain. No orthopnea. No palpitations. No syncope.  GASTROINTESTINAL: No nausea, no vomiting or diarrhea. No abdominal pain. No melena or hematochezia.  GENITOURINARY: No dysuria or hematuria.  ENDOCRINE: No polyuria or nocturia. No heat or cold intolerance.  HEMATOLOGY: No anemia. No bruising. No bleeding.  INTEGUMENTARY: No rashes. No lesions.  MUSCULOSKELETAL: No arthritis. No swelling. No gout.  NEUROLOGIC: No numbness, tingling, or ataxia. No seizure-type activity.  PSYCHIATRIC: No anxiety. No insomnia. No ADD.    Vitals:   Vitals:   04/27/18 0524 04/27/18 0549 04/27/18 0730 04/27/18 0905  BP: (!) 174/66 (!) 166/66  (!) 155/96  Pulse: 78 75  77  Resp:    14  Temp:      TempSrc:      SpO2:   100% 99%  Weight:      Height:        Wt Readings from Last 3 Encounters:  04/27/18 85.2 kg  04/23/18 84.8 kg  04/22/18 85.5 kg     Intake/Output Summary (Last 24 hours) at 04/27/2018 1406 Last data filed at 04/27/2018 1116 Gross per 24 hour  Intake 360 ml  Output 1800 ml  Net -1440 ml    Physical Exam:    GENERAL: Pleasant-appearing in no apparent distress.  HEAD, EYES, EARS, NOSE AND THROAT: Atraumatic, normocephalic. Extraocular muscles are intact. Pupils equal and reactive to light. Sclerae anicteric. No conjunctival injection. No oro-pharyngeal erythema.  NECK: Supple. There is no jugular venous distention. No bruits, no lymphadenopathy, no thyromegaly.  HEART: Regular rate and rhythm,. No murmurs, no rubs, no clicks.  LUNGS: Bilateral crackles throughout both lung ABDOMEN: Soft, flat, nontender, nondistended. Has good bowel sounds. No hepatosplenomegaly appreciated.  EXTREMITIES: No evidence of any cyanosis, clubbing, or peripheral edema.  +2 pedal  and radial pulses bilaterally.  NEUROLOGIC: The patient is alert, awake, and oriented x3 with no focal motor or sensory deficits appreciated bilaterally.  SKIN: Moist and warm with no rashes appreciated.  Psych: Not anxious, depressed LN: No inguinal LN enlargement    Antibiotics   Anti-infectives (From admission, onward)   None      Medications   Scheduled Meds: . allopurinol  100 mg Oral Daily  . ALPRAZolam  0.25 mg Oral Daily  . amLODipine  10 mg Oral Daily  . aspirin EC  81 mg Oral Daily  . dicyclomine  10 mg Oral TID AC & HS  . docusate sodium  100 mg Oral BID  . feeding supplement (GLUCERNA SHAKE)  237 mL Oral BID BM  . heparin  5,000 Units Subcutaneous Q8H  . hydrALAZINE  25 mg Oral Q8H  . insulin aspart  0-15 Units Subcutaneous TID AC & HS  . insulin aspart  5 Units Subcutaneous TID WC  . insulin glargine  40 Units Subcutaneous QHS  . ipratropium-albuterol  3 mL Nebulization Q6H  . isosorbide mononitrate  30 mg Oral Daily  . methylPREDNISolone (SOLU-MEDROL) injection  60 mg Intravenous Q24H  . mometasone-formoterol  2 puff Inhalation BID  . nitroGLYCERIN  0.5 inch Topical Q6H  . protein supplement shake  11 oz Oral BID BM  . rosuvastatin  20 mg Oral Daily  . ticagrelor  60 mg Oral BID   Continuous Infusions: PRN  Meds:.acetaminophen **OR** acetaminophen, diphenhydrAMINE, diphenoxylate-atropine, fluticasone, guaiFENesin-dextromethorphan, hydrALAZINE, morphine injection, nitroGLYCERIN, ondansetron **OR** ondansetron (ZOFRAN) IV   Data Review:   Micro Results No results found for this or any previous visit (from the past 240 hour(s)).  Radiology Reports Dg Chest Port 1 View  Result Date: 04/25/2018 CLINICAL DATA:  Shortness of breath. EXAM: PORTABLE CHEST 1 VIEW COMPARISON:  Chest radiograph March 19, 2018 FINDINGS: Cardiac silhouette is mildly enlarged. Calcified aortic arch. Low inspiratory examination with increased interstitial prominence. New bandlike density RIGHT lung base with small RIGHT pleural effusion tracking to major fissure. Calcified mediastinal lymph nodes. No pneumothorax. ACDF. Old LEFT rib fractures. IMPRESSION: 1. Cardiomegaly. Interstitial prominence suggest pulmonary edema superimposed on chronic interstitial changes. 2. Small RIGHT pleural effusion.  RIGHT lung base atelectasis. Electronically Signed   By: Elon Alas M.D.   On: 04/25/2018 02:16     CBC Recent Labs  Lab 04/25/18 0130  WBC 7.8  HGB 10.4*  HCT 33.2*  PLT 355  MCV 90.2  MCH 28.3  MCHC 31.3  RDW 15.4  LYMPHSABS 1.2  MONOABS 0.7  EOSABS 0.2  BASOSABS 0.1    Chemistries  Recent Labs  Lab 04/25/18 0130 04/26/18 0909 04/27/18 0525  NA 138 134* 136  K 3.2* 3.9 3.9  CL 106 100 101  CO2 24 24 28   GLUCOSE 180* 347* 239*  BUN 15 29* 45*  CREATININE 1.18* 1.43* 1.60*  CALCIUM 9.9 10.1 10.1  AST 22  --   --   ALT 15  --   --   ALKPHOS 61  --   --   BILITOT 0.6  --   --    ------------------------------------------------------------------------------------------------------------------ estimated creatinine clearance is 33 mL/min (A) (by C-G formula based on SCr of 1.6 mg/dL  (H)). ------------------------------------------------------------------------------------------------------------------ No results for input(s): HGBA1C in the last 72 hours. ------------------------------------------------------------------------------------------------------------------ No results for input(s): CHOL, HDL, LDLCALC, TRIG, CHOLHDL, LDLDIRECT in the last 72 hours. ------------------------------------------------------------------------------------------------------------------ Recent Labs    04/25/18 0743  TSH 0.758   ------------------------------------------------------------------------------------------------------------------ No  results for input(s): VITAMINB12, FOLATE, FERRITIN, TIBC, IRON, RETICCTPCT in the last 72 hours.  Coagulation profile No results for input(s): INR, PROTIME in the last 168 hours.  No results for input(s): DDIMER in the last 72 hours.  Cardiac Enzymes Recent Labs  Lab 04/25/18 0743 04/25/18 1325 04/25/18 1820  TROPONINI 0.03* 0.03* 0.03*   ------------------------------------------------------------------------------------------------------------------ Invalid input(s): POCBNP    Assessment & Plan   This is a 77 year old female admitted for CHF exacerbation. 1.  CHF: Acute on chronic; diastolic.    Continue IV Lasix continues to be symptomatic seen by her cardiologist 2.  COPD: With mild exacerbation as there is some wheezing.  Contributes to respiratory distress.    Continue Solu-Medrol and neb 3.  CAD: Stable; continue aspirin and Brilinta.  Continue Imdur 4.  Hypertension: Uncontrolled; continue amlodipine I will increase dose of her Imdur 5.  Diabetes mellitus type 2: Blood sugar elevated , will increase Lantus 50 units as well as pre-meal insulin to blood sugars being elevated 6.  Hyperlipidemia: Continue statin therapy 7.  DVT prophylaxis: Heparin 8.  GI prophylaxis: None      Code Status Orders  (From admission,  onward)         Start     Ordered   04/25/18 0647  Full code  Continuous     04/25/18 0646        Code Status History    Date Active Date Inactive Code Status Order ID Comments User Context   06/04/2017 0151 06/04/2017 1944 Full Code [id number]  Lance Coon, MD Inpatient   05/24/2017 0129 05/26/2017 1926 Full Code [id number]  Lance Coon, MD Inpatient   11/14/2016 2249 11/15/2016 [facility] Full Code [id number]  [place], [place], DO ED   10/10/2016 1325 10/13/2016 2020 Full Code [id number]  Earnie Larsson, MD Inpatient   06/11/2016 2142 06/12/2016 1923 Full Code [id number]  [place], [place], DO ED   01/02/2016 0524 01/02/2016 0914 Full Code [id number]  Saundra Shelling, MD Inpatient   06/26/2015 0736 06/29/2015 1954 Full Code [id number]  Demetrios Loll, MD ED   04/20/2015 0118 04/22/2015 1918 Full Code [id number]  Lance Coon, MD Inpatient           Consults none  DVT Prophylaxis  Lovenox   Lab Results  Component Value Date   PLT 355 04/25/2018     Time Spent in minutes   35 minutes spent, 12:30 PM to 1:30 PM  greater than 50% of time spent in care coordination and counseling patient regarding the condition and plan of care.   Dustin Flock M.D on 04/27/2018 at 2:06 PM  Between 7am to 6pm - Pager - [phone]  After 6pm go to [email or web address] - Proofreader  [employer]   Office  [phone]

## 2018-04-27 NOTE — Progress Notes (Signed)
Cardiovascular and Pulmonary Nurse Navigator Note:    Rounded on patient to review CHF education.  Patient receiving breathing treatment at this time.  Visitors in room.  Will attempt to round on patient later.    Roanna Epley, RN, BSN, [facility]  Cardiovascular & Pulmonary Nurse Navigator  Direct Line: [phone]  Department Phone #: [phone] Fax: [phone]  Email Address: [email or web address]

## 2018-04-27 NOTE — Progress Notes (Signed)
Pt c/o chest pain 8/10. Pt described chest pain as similar to what she was experiencing the previous night when she was having SOB. Pt BP 174/66 with nitropaste on. MD Marcille Blanco made aware. IV morphine 1 ml ordered and given. Pt stated the discomfort was easing off and she was feeling better. Pt BP also going down as well. Will continue to monitor.

## 2018-04-27 NOTE — Progress Notes (Signed)
*  PRELIMINARY RESULTS* Echocardiogram 2D Echocardiogram has been performed. Definity image enhancer was administered.  Pamela Collier 04/27/2018, 12:15 PM

## 2018-04-28 DIAGNOSIS — J441 Chronic obstructive pulmonary disease with (acute) exacerbation: Secondary | ICD-10-CM

## 2018-04-28 DIAGNOSIS — R0603 Acute respiratory distress: Secondary | ICD-10-CM

## 2018-04-28 DIAGNOSIS — R0789 Other chest pain: Secondary | ICD-10-CM

## 2018-04-28 LAB — CBC
HCT: 33.8 % — ABNORMAL LOW (ref 36.0–46.0)
Hemoglobin: 10.6 g/dL — ABNORMAL LOW (ref 12.0–15.0)
MCH: 28.3 pg (ref 26.0–34.0)
MCHC: 31.4 g/dL (ref 30.0–36.0)
MCV: 90.1 fL (ref 80.0–100.0)
Platelets: 387 10*3/uL (ref 150–400)
RBC: 3.75 MIL/uL — ABNORMAL LOW (ref 3.87–5.11)
RDW: 15.7 % — ABNORMAL HIGH (ref 11.5–15.5)
WBC: 12.4 10*3/uL — ABNORMAL HIGH (ref 4.0–10.5)
nRBC: 0 % (ref 0.0–0.2)

## 2018-04-28 LAB — GLUCOSE, CAPILLARY
Glucose-Capillary: 329 mg/dL — ABNORMAL HIGH (ref 70–99)
Glucose-Capillary: 204 mg/dL — ABNORMAL HIGH (ref 70–99)
Glucose-Capillary: 174 mg/dL — ABNORMAL HIGH (ref 70–99)
Glucose-Capillary: 212 mg/dL — ABNORMAL HIGH (ref 70–99)
Glucose-Capillary: 364 mg/dL — ABNORMAL HIGH (ref 70–99)
Glucose-Capillary: 337 mg/dL — ABNORMAL HIGH (ref 70–99)

## 2018-04-28 LAB — BASIC METABOLIC PANEL
ANION GAP: 8 (ref 5–15)
BUN: 45 mg/dL — ABNORMAL HIGH (ref 8–23)
CO2: 27 mmol/L (ref 22–32)
Calcium: 10.4 mg/dL — ABNORMAL HIGH (ref 8.9–10.3)
Chloride: 103 mmol/L (ref 98–111)
Creatinine, Ser: 1.55 mg/dL — ABNORMAL HIGH (ref 0.44–1.00)
GFR calc Af Amer: 37 mL/min — ABNORMAL LOW (ref >60)
GFR calc non Af Amer: 32 mL/min — ABNORMAL LOW (ref >60)
GLUCOSE: 276 mg/dL — AB (ref 70–99)
Potassium: 4.1 mmol/L (ref 3.5–5.1)
Sodium: 138 mmol/L (ref 135–145)

## 2018-04-28 MED ORDER — HYDRALAZINE HCL 50 MG PO TABS
50.0000 mg | ORAL_TABLET | Freq: Three times a day (TID) | ORAL | Status: DC
  Administered 2018-04-28 – 2018-04-30 (×6): 50 mg via ORAL
  Filled 2018-04-28 (×6): qty 1

## 2018-04-28 MED ORDER — TRAMADOL HCL 50 MG PO TABS
50.0000 mg | ORAL_TABLET | Freq: Four times a day (QID) | ORAL | Status: DC | PRN
  Administered 2018-04-28: 50 mg via ORAL
  Filled 2018-04-28: qty 1

## 2018-04-28 MED ORDER — OXYCODONE-ACETAMINOPHEN 5-325 MG PO TABS
1.0000 | ORAL_TABLET | ORAL | Status: DC | PRN
  Administered 2018-04-28 – 2018-04-30 (×6): 1 via ORAL
  Filled 2018-04-28 (×6): qty 1

## 2018-04-28 MED ORDER — ISOSORBIDE MONONITRATE ER 30 MG PO TB24
30.0000 mg | ORAL_TABLET | Freq: Two times a day (BID) | ORAL | Status: DC
  Administered 2018-04-28 – 2018-04-30 (×4): 30 mg via ORAL
  Filled 2018-04-28 (×4): qty 1

## 2018-04-28 NOTE — Progress Notes (Signed)
[place] at [place] [name]    MR#:  [id number]  DATE OF BIRTH:  [birth year]  SUBJECTIVE:   Patient here due to shortness of breath secondary to COPD exacerbation also mild CHF.  Still complaining of some chest pain which seems more muscular in nature.  REVIEW OF SYSTEMS:    Review of Systems  Constitutional: Negative for chills and fever.  HENT: Negative for congestion and tinnitus.   Eyes: Negative for blurred vision and double vision.  Respiratory: Positive for shortness of breath. Negative for cough and wheezing.   Cardiovascular: Positive for chest pain. Negative for orthopnea and PND.  Gastrointestinal: Negative for abdominal pain, diarrhea, nausea and vomiting.  Genitourinary: Negative for dysuria and hematuria.  Neurological: Negative for dizziness, sensory change and focal weakness.  All other systems reviewed and are negative.    Nutrition: heart healthy/Carb modified Tolerating Diet: Yes Tolerating PT: Await Eval.   DRUG ALLERGIES:   Allergies  Allergen Reactions  . Cephalexin Itching  . Contrast Media [Iodinated Diagnostic Agents] Itching  . Doxycycline Itching  . Gabapentin Itching  . Sulfa Antibiotics Itching    VITALS:  Blood pressure (!) 164/51, pulse 85, temperature 98.4 F (36.9 C), temperature source Oral, resp. rate 14, height 5\' 7"  (1.702 m), weight 84.9 kg, SpO2 96 %.  PHYSICAL EXAMINATION:   Physical Exam  GENERAL:  77 y.o.-year-old patient lying in bed in NAD.  EYES: Pupils equal, round, reactive to light and accommodation. No scleral icterus. Extraocular muscles intact.  HEENT: Head atraumatic, normocephalic. Oropharynx and nasopharynx clear.  NECK:  Supple, no jugular venous distention. No thyroid enlargement, no tenderness.  LUNGS: Poor Resp. effort, no wheezing, rales, rhonchi. No use of accessory muscles of respiration.  CARDIOVASCULAR: S1, S2 normal. No murmurs, rubs, or  gallops.  ABDOMEN: Soft, nontender, nondistended. Bowel sounds present. No organomegaly or mass.  EXTREMITIES: No cyanosis, clubbing or edema b/l.    NEUROLOGIC: Cranial nerves II through XII are intact. No focal Motor or sensory deficits b/l. Globally weak.   PSYCHIATRIC: The patient is alert and oriented x 3.  SKIN: No obvious rash, lesion, or ulcer.    LABORATORY PANEL:   CBC Recent Labs  Lab 04/28/18 0336  WBC 12.4*  HGB 10.6*  HCT 33.8*  PLT 387   ------------------------------------------------------------------------------------------------------------------  Chemistries  Recent Labs  Lab 04/25/18 0130  04/28/18 0336  NA 138   < > 138  K 3.2*   < > 4.1  CL 106   < > 103  CO2 24   < > 27  GLUCOSE 180*   < > 276*  BUN 15   < > 45*  CREATININE 1.18*   < > 1.55*  CALCIUM 9.9   < > 10.4*  AST 22  --   --   ALT 15  --   --   ALKPHOS 61  --   --   BILITOT 0.6  --   --    < > = values in this interval not displayed.   ------------------------------------------------------------------------------------------------------------------  Cardiac Enzymes Recent Labs  Lab 04/25/18 1820  TROPONINI 0.03*   ------------------------------------------------------------------------------------------------------------------  RADIOLOGY:  No results found.   ASSESSMENT AND PLAN:   77 year old female with past medical history of COPD, coronary artery disease status post previous stent placement, hyperlipidemia, CHF, gout, anxiety, hypertension who presented to the hospital due to shortness of breath.  1.  Acute respiratory failure with hypoxia-secondary to  COPD exacerbation. - Much improved, no wheezing or bronchospasm.  Continue O2 supplementation. -Continue IV steroids, scheduled nebs and antitussives.  2.  COPD exacerbation-this is a source of patient's worsening respiratory failure and hypoxia. - Continue treatment as mentioned above.  3.  Chest pain-seems more  musculoskeletal in nature. - Seen by cardiology no plans for acute cardiac intervention.  Cardiac markers are negative. - In some tramadol this morning without much relief and therefore some oral Percocet ordered as needed.  4.  Diabetes type 2 without complication-continue Lantus, NovoLog with meals with sliding scale insulin coverage.  5.  Essential hypertension-continue hydralazine, Norvasc, Imdur.  6.  Hyperlipidemia-continue Crestor.  7.  History of coronary artery disease-no evidence of acute coronary syndrome. -Continue Brilinta, aspirin, Crestor.  Appreciate cardiology input.  Continue Imdur  8.  History of gout-no acute attack.  Continue allopurinol.  9.  Anxiety-continue Xanax.  We will get physical therapy consult to assess patient's mobility.  All the records are reviewed and case discussed with Care Management/Social Worker. Management plans discussed with the patient, family and they are in agreement.  CODE STATUS: Full code  DVT Prophylaxis: Hep. SQ  TOTAL TIME TAKING CARE OF THIS PATIENT: 30 minutes.   POSSIBLE D/C IN 1-2 DAYS, DEPENDING ON CLINICAL CONDITION.   Henreitta Leber M.D on 04/28/2018 at 4:36 PM  Between 7am to 6pm - Pager - [phone]  After 6pm go to [email or web address] - Proofreader  [facility]  Office  [phone]  CC: Primary care physician; [name], MD

## 2018-04-28 NOTE — Progress Notes (Signed)
Inpatient Diabetes Program Recommendations  AACE/ADA: New Consensus Statement on Inpatient Glycemic Control (2015)  Target Ranges:  Prepandial:   less than 140 mg/dL      Peak postprandial:   less than 180 mg/dL (1-2 hours)      Critically ill patients:  140 - 180 mg/dL   Lab Results  Component Value Date   GLUCAP 212 (H) 04/28/2018   HGBA1C 7.7 (H) 02/10/2018    Review of Glycemic Control Results for MODEST, DRAEGER (MRN [id number]) as of 04/28/2018 13:53  Ref. Range 04/27/2018 08:21 04/27/2018 11:43 04/27/2018 17:05 04/27/2018 20:59 04/28/2018 01:11 04/28/2018 05:56 04/28/2018 08:14 04/28/2018 11:28  Glucose-Capillary Latest Ref Range: 70 - 99 mg/dL 190 (H) 411 (H) 412 (H) 401 (H) 329 (H) 204 (H) 174 (H) 212 (H)  Diabetes history: DM 2 Outpatient Diabetes medications: Novolog 70/30 30 units bid Current orders for Inpatient glycemic control:  Novolog moderate tid with meals and HS, Lantus 50 units q HS, Novolog 5 units tid with meals Solumedrol 60 mg IV q 24 hours Inpatient Diabetes Program Recommendations:    Consider increasing Novolog to 7 units tid with meals while on steroids.   Thanks,  Adah Perl, RN, BC-ADM Inpatient Diabetes Coordinator Pager [phone] (8a-5p)

## 2018-04-28 NOTE — Progress Notes (Signed)
Qualifying Oxygen Assessment  SaO2 on room air at rest = 95% SaO2 on room air while ambulating = 94%   Lieutenant Diego PT, DPT 4:13 PM,04/28/18 [phone]

## 2018-04-28 NOTE — Progress Notes (Signed)
Progress Note  Patient [name] Date of Encounter: 04/28/2018  Primary Cardiologist: Ida Rogue, MD   Subjective   Multiple complaints on today's visit Left-sided chest pain, reproducible on palpation above the left breast Constipation Chronic shortness of breath  Inpatient Medications    Scheduled Meds: . allopurinol  100 mg Oral Daily  . ALPRAZolam  0.25 mg Oral Daily  . amLODipine  10 mg Oral Daily  . aspirin EC  81 mg Oral Daily  . dicyclomine  10 mg Oral TID AC & HS  . docusate sodium  100 mg Oral BID  . feeding supplement (GLUCERNA SHAKE)  237 mL Oral BID BM  . heparin  5,000 Units Subcutaneous Q8H  . hydrALAZINE  25 mg Oral Q8H  . insulin aspart  0-15 Units Subcutaneous TID AC & HS  . insulin aspart  5 Units Subcutaneous TID WC  . insulin glargine  50 Units Subcutaneous QHS  . ipratropium-albuterol  3 mL Nebulization Q6H  . isosorbide mononitrate  30 mg Oral Daily  . methylPREDNISolone (SOLU-MEDROL) injection  60 mg Intravenous Q24H  . protein supplement shake  11 oz Oral BID BM  . rosuvastatin  20 mg Oral Daily  . ticagrelor  60 mg Oral BID   Continuous Infusions:  PRN Meds: acetaminophen **OR** acetaminophen, diphenhydrAMINE, diphenoxylate-atropine, fluticasone, guaiFENesin-dextromethorphan, hydrALAZINE, morphine injection, nitroGLYCERIN, ondansetron **OR** ondansetron (ZOFRAN) IV, oxyCODONE-acetaminophen   Vital Signs    Vitals:   04/28/18 0813 04/28/18 1338 04/28/18 1555 04/28/18 1734  BP: (!) 164/51   (!) 182/62  Pulse: 85   93  Resp:      Temp: 98.4 F (36.9 C)   98.4 F (36.9 C)  TempSrc: Oral   Oral  SpO2: 98% 97% 96% 98%  Weight:      Height:        Intake/Output Summary (Last 24 hours) at 04/28/2018 1816 Last data filed at 04/28/2018 1355 Gross per 24 hour  Intake 480 ml  Output 1400 ml  Net -920 ml   Filed Weights   04/26/18 0358 04/27/18 0445 04/28/18 0319  Weight: 83.9 kg 85.2 kg 84.9 kg    Telemetry      Normal sinus rhythm heart rate 80-90- Personally Reviewed  ECG    No new tracings- Personally Reviewed  Physical Exam   Constitutional:  oriented to person, place, and time. No distress.  Obese  tremor HENT:  Head: Grossly normal Eyes:  no discharge. No scleral icterus.  Neck: No JVD, no carotid bruits  Cardiovascular: Regular rate and rhythm, no murmurs appreciated Pulmonary/Chest: Clear to auscultation bilaterally, no wheezes or rails Abdominal: Soft.  no distension.  no tenderness.  Musculoskeletal: Normal range of motion Neurological:  normal muscle tone. Coordination normal. No atrophy Skin: Skin warm and dry Psychiatric: normal affect, pleasant   Labs    Chemistry Recent Labs  Lab 04/25/18 0130 04/26/18 0909 04/27/18 0525 04/28/18 0336  NA 138 134* 136 138  K 3.2* 3.9 3.9 4.1  CL 106 100 101 103  CO2 24 24 28 27   GLUCOSE 180* 347* 239* 276*  BUN 15 29* 45* 45*  CREATININE 1.18* 1.43* 1.60* 1.55*  CALCIUM 9.9 10.1 10.1 10.4*  PROT 7.5  --   --   --   ALBUMIN 3.5  --   --   --   AST 22  --   --   --   ALT 15  --   --   --   [facility]  61  --   --   --   BILITOT 0.6  --   --   --   GFRNONAA 44* 35* 31* 32*  GFRAA 52* 41* 36* 37*  ANIONGAP 8 10 7 8      Hematology Recent Labs  Lab 04/25/18 0130 04/28/18 0336  WBC 7.8 12.4*  RBC 3.68* 3.75*  HGB 10.4* 10.6*  HCT 33.2* 33.8*  MCV 90.2 90.1  MCH 28.3 28.3  MCHC 31.3 31.4  RDW 15.4 15.7*  PLT 355 387    Cardiac Enzymes Recent Labs  Lab 04/25/18 0130 04/25/18 0743 04/25/18 1325 04/25/18 1820  TROPONINI 0.03* 0.03* 0.03* 0.03*   No results for input(s): TROPIPOC in the last 168 hours.   BNP Recent Labs  Lab 04/25/18 0130  BNP 282.0*     DDimer No results for input(s): DDIMER in the last 168 hours.   Radiology    No results found.  Cardiac Studies   Pending updated echo performed 12/17  05/24/2017 TTE Study Conclusions - Left ventricle: The cavity size was normal. There was mild  to moderate concentric hypertrophy. Systolic function was normal. The estimated ejection fraction was in the range of 60% to 65%. Wall motion was normal; there were no regional wall motion abnormalities. Features are consistent with a pseudonormal left ventricular filling pattern, with concomitant abnormal relaxation and increased filling pressure (grade 2 diastolic dysfunction). - Mitral valve: Calcified annulus. - Left atrium: The atrium was normal in size. - Right ventricle: Systolic function was normal. - Pulmonary arteries: Systolic pressure was within the normal range.  01/29/2016 NM Study Pharmacological myocardial perfusion imaging study with no significant ischemia Normal wall motion, EF estimated at 55% No EKG changes concerning for ischemia at peak stress or in recovery. Baseline EKG with IVCD and LAFB Low risk scan  05/2011 Cath Cardiac catheterization from January 2013 documenting ejection fraction 35-40%, 40% mid LAD, 50% distal LAD, small diagonal with 70% disease, 50% proximal left circumflex, 75% distal left circumflex with patent stent, small RCA, calcified, severe atherosclerosis, 90% distal RCA  *Details from catheterization March 2016 are not available, request made to hospital in [place] on today's visit for records  Patient Profile     77 y.o. female with history of CAD, ICM, HFpEF (EF 55 to 60%), hypertension, hyperlipidemia, diabetes, CKD stage III, pulmonary sarcoidosis, COPD not on home oxygen, peripheral vascular disease, depression, OSA on home CPAP, prior stroke who is being seen today for the evaluation of heart failure.  Assessment & Plan    Acute on Chronic HFpEF  etiology likely multifactorial in the setting of HFpEF, anemia, known COPD, and pulmonary sarcoidosis. -She has had aggressive diuresis this hospital admission with climbing BUN and creatinine, Lasix held -Suspect much of her shortness of breath may be from  deconditioning   CAD with h/o PCI   Atypical chest pain, reproducible on palpation left pectoral region Reports tramadol is not helping her discomfort Discussed with her that pain is musculoskeletal not cardiac No further ischemic work-up needed  HTN  Blood pressure continues to be markedly elevated 893 up to 810 systolic Would recommend we increase isosorbide to 30 twice daily Hydralazine up to 50 every 8  HLD crestor 20mg  daily  CKDIII Would continue to hold diuretic given elevated creatinine above her baseline and elevated BUN  Anemia Stable hemoglobin of 10  DM2 Hemoglobin A1c 7.7 for the past year Adequate  COPD  Continue breathing treatments, [place] O2, CPAP as tolerated.  Likely  exacerbated by morbid obesity and deconditioning  home CPAP as tolerated for breathing.  OSA CPAP  Needs PT evaluation Very few supports at home Husband also ill with medical issues likely leading to frequent readmissions for various reasons   Total encounter time more than 25 minutes  Greater than 50% was spent in counseling and coordination of care with the patient   For questions or updates, please contact [place] Please consult [email or web address] for contact info under        Signed, Ida Rogue, MD  04/28/2018, 6:16 PM

## 2018-04-28 NOTE — Evaluation (Signed)
Physical Therapy Evaluation Patient Details [name] MRN: [id number] DOB: 1940-09-03 Today's Date: 04/28/2018   History of Present Illness  Pt is 77 yo female admitted for SOB, chest pain, workup showed acute on chronic CHF. PMH of CHF, COPD, DM, HLD.   Clinical Impression  Patient A&Ox4 at start of session, no complaints of chest pain or SOB at rest, on room air spO2 >92%. Pt reported that she lives with her husband who is unable to provide any physical assist. Pt stated she has had multiple hospital admissions this year, and recently stayed at [facility] for several weeks. Currently has HHPT an aides that assist with ADLs, husband performs IADLs. Ambulates in home with rollator.  Throughout session pt exhibited shakiness/tremors of UE and trunk, stated this is her baseline. Spo2 >92% on room air throughout mobilization. Patient able to mobilize to EOB with supervision and HOB elevated. Was able to sit EOB with supervision ~80mins due in complaints of dizziness that improved over time. Pt relied heavily on RW for transfers, PT stabilized RW and CGA of patient. Pt ambulated in room ~37ft with RW and CGA with very small, shuffling steps, exhibited fatigue, shakiness, and reported feeling weak. Significantly decreased gait speed. The patient demonstrated limitations (see "PT Problem List") that impede the patients ability to perform functional activities and would benefit from skilled PT to address these as well as to maximize independence, mobility, and safety. Recommendation is STR due to current level of assistance needed, deficits listed below, and inaccessible home environment.     Follow Up Recommendations SNF    Equipment Recommendations  Other (comment)(TBD)    Recommendations for Other Services       Precautions / Restrictions Precautions Precautions: Fall Restrictions Weight Bearing Restrictions: No      Mobility  Bed Mobility Overal bed mobility: Needs  Assistance Bed Mobility: Supine to Sit     Supine to sit: HOB elevated;Supervision     General bed mobility comments: heavy use of bedrails  Transfers Overall transfer level: Needs assistance Equipment used: Rolling walker (2 wheeled) Transfers: Sit to/from Stand Sit to Stand: Min guard         General transfer comment: Pt relies on RW to assist, PT stabilized RW during sit to stand.   Ambulation/Gait   Gait Distance (Feet): 8 Feet Assistive device: Rolling walker (2 wheeled)   Gait velocity: significantly decreased   General Gait Details: Short, shuffling step, complained of weakness in LE throughout, tremors present. Pt SOB, but vitals stable.   Stairs            Wheelchair Mobility    Modified Rankin (Stroke Patients Only)       Balance Overall balance assessment: Needs assistance Sitting-balance support: Feet supported Sitting balance-Leahy Scale: Fair       Standing balance-Leahy Scale: Poor                               Pertinent Vitals/Pain Pain Assessment: No/denies pain    Home Living Family/patient expects to be discharged to:: Private residence Living Arrangements: Spouse/significant other Available Help at Discharge: Family;Available 24 hours/day;Personal care attendant Type of Home: House Home Access: Other (comment)(1 threshold step)     Home Layout: One level;Other (Comment)(Pt has 3 steps to transition from bedroom/kitchen with 1 rail on R.) Home Equipment: Walker - 2 wheels;Walker - 4 wheels;Cane - quad;Toilet riser;Bedside commode;Shower seat;Grab bars - toilet;Grab bars - tub/shower  Prior Function Level of Independence: Needs assistance   Gait / Transfers Assistance Needed: RW for all mobility  ADL's / Homemaking Assistance Needed: aides that come every day to assist with ADLs  Comments: Pt reported 1-2 falls in the last 6 months. Recently been at [facility].      Hand Dominance        Extremity/Trunk  Assessment   Upper Extremity Assessment Upper Extremity Assessment: Generalized weakness(grossly 3+/5, tremors throughout session)    Lower Extremity Assessment Lower Extremity Assessment: Generalized weakness(grossly 4-/5, tremors throughout session)       Communication   Communication: No difficulties  Cognition Arousal/Alertness: Awake/alert Behavior During Therapy: WFL for tasks assessed/performed Overall Cognitive Status: Within Functional Limits for tasks assessed                                 General Comments: mild confusion while answering questions, needed extended time       General Comments      Exercises     Assessment/Plan    PT Assessment Patient needs continued PT services  PT Problem List Decreased strength;Decreased activity tolerance;Decreased knowledge of use of DME;Decreased balance;Decreased safety awareness;Decreased mobility       PT Treatment Interventions DME instruction;Therapeutic exercise;Gait training;Balance training;Stair training;Neuromuscular re-education;Functional mobility training;Therapeutic activities;Patient/family education    PT Goals (Current goals can be found in the Care Plan section)  Acute Rehab PT Goals Patient Stated Goal: Pt would like to go home PT Goal Formulation: With patient Time For Goal Achievement: 05/12/18 Potential to Achieve Goals: Fair    Frequency Min 2X/week   Barriers to discharge Inaccessible home environment;Decreased caregiver support      Co-evaluation               AM-PAC PT "6 Clicks" Mobility  Outcome Measure Help needed turning from your back to your side while in a flat bed without using bedrails?: A Little Help needed moving from lying on your back to sitting on the side of a flat bed without using bedrails?: A Little Help needed moving to and from a bed to a chair (including a wheelchair)?: A Little Help needed standing up from a chair using your arms (e.g.,  wheelchair or bedside chair)?: A Little Help needed to walk in hospital room?: A Lot Help needed climbing 3-5 steps with a railing? : Total 6 Click Score: 15    End of Session Equipment Utilized During Treatment: Gait belt Activity Tolerance: Patient limited by fatigue Patient left: with chair alarm set;in chair;Other (comment);with call bell/phone within reach(heels elevated) Nurse Communication: Mobility status PT Visit Diagnosis: Unsteadiness on feet (R26.81);Difficulty in walking, not elsewhere classified (R26.2);History of falling (Z91.81);Muscle weakness (generalized) (M62.81);Other abnormalities of gait and mobility (R26.89)    Time: 9292-4462 PT Time Calculation (min) (ACUTE ONLY): 28 min   Charges:   PT Evaluation $PT Eval Moderate Complexity: 1 Mod PT Treatments $Therapeutic Activity: 8-22 mins        Lieutenant Diego PT, DPT 4:12 PM,04/28/18 [phone]

## 2018-04-28 NOTE — Care Management Note (Signed)
Case Management Note  Patient Details  [name] MRN: [id number] Date of Birth: 09-07-1940  Subjective/Objective:   Patient is from home with husband.  Admitted with CHF exacerbation.  Receiving IV lasix and solu-medrol.  Currently on room air; has cpap at home.  Recently discharged from [facility].  Open to [facility] for home health SN, PT, OT.  PT is recommending SNF at discharge.  Patient is worried about leaving her husband at home as he is "pretty bad off".  They do not have family close by.  She uses a walker at home; drives occasionally.  Randall Hiss, CSW aware of PT recommendation.  Patient would like to talk to her husband about SNF placement.                   Action/Plan:   Expected Discharge Date:                  Expected Discharge Plan:  Skilled Nursing Facility  In-House Referral:  Clinical Social Work  Discharge planning Services  CM Consult  Post Acute Care Choice:    Choice offered to:     DME Arranged:    DME Agency:     HH Arranged:    [place] Agency:     Status of Service:  In process, will continue to follow  If discussed at Long Length of Stay Meetings, dates discussed:    Additional Comments:  Elza Rafter, RN 04/28/2018, 4:29 PM

## 2018-04-29 DIAGNOSIS — R531 Weakness: Secondary | ICD-10-CM

## 2018-04-29 DIAGNOSIS — I509 Heart failure, unspecified: Secondary | ICD-10-CM

## 2018-04-29 LAB — BASIC METABOLIC PANEL
Anion gap: 8 (ref 5–15)
BUN: 45 mg/dL — ABNORMAL HIGH (ref 8–23)
CO2: 28 mmol/L (ref 22–32)
Calcium: 10.7 mg/dL — ABNORMAL HIGH (ref 8.9–10.3)
Chloride: 104 mmol/L (ref 98–111)
Creatinine, Ser: 1.35 mg/dL — ABNORMAL HIGH (ref 0.44–1.00)
GFR calc Af Amer: 44 mL/min — ABNORMAL LOW (ref >60)
GFR calc non Af Amer: 38 mL/min — ABNORMAL LOW (ref >60)
Glucose, Bld: 145 mg/dL — ABNORMAL HIGH (ref 70–99)
Potassium: 3.9 mmol/L (ref 3.5–5.1)
Sodium: 140 mmol/L (ref 135–145)

## 2018-04-29 LAB — CBC
HEMATOCRIT: 34.1 % — AB (ref 36.0–46.0)
Hemoglobin: 10.8 g/dL — ABNORMAL LOW (ref 12.0–15.0)
MCH: 28.3 pg (ref 26.0–34.0)
MCHC: 31.7 g/dL (ref 30.0–36.0)
MCV: 89.3 fL (ref 80.0–100.0)
Platelets: 390 10*3/uL (ref 150–400)
RBC: 3.82 MIL/uL — ABNORMAL LOW (ref 3.87–5.11)
RDW: 15.9 % — ABNORMAL HIGH (ref 11.5–15.5)
WBC: 12.3 10*3/uL — ABNORMAL HIGH (ref 4.0–10.5)
nRBC: 0 % (ref 0.0–0.2)

## 2018-04-29 LAB — GLUCOSE, CAPILLARY
Glucose-Capillary: 101 mg/dL — ABNORMAL HIGH (ref 70–99)
Glucose-Capillary: 151 mg/dL — ABNORMAL HIGH (ref 70–99)
Glucose-Capillary: 288 mg/dL — ABNORMAL HIGH (ref 70–99)
Glucose-Capillary: 313 mg/dL — ABNORMAL HIGH (ref 70–99)

## 2018-04-29 MED ORDER — HYDROXYZINE HCL 25 MG PO TABS
25.0000 mg | ORAL_TABLET | Freq: Once | ORAL | Status: AC
  Administered 2018-04-29: 25 mg via ORAL
  Filled 2018-04-29: qty 1

## 2018-04-29 NOTE — Progress Notes (Signed)
SATURATION QUALIFICATIONS: (This note is used to comply with regulatory documentation for home oxygen)  Patient Saturations on Room Air at Rest = 95%  Patient Saturations on Room Air while Ambulating = 94%  Patient Saturations on 0 Liters of oxygen while Ambulating = n/a%  Please briefly explain why patient needs home oxygen:

## 2018-04-29 NOTE — NC FL2 (Signed)
[place] LEVEL OF CARE SCREENING TOOL     IDENTIFICATION  Patient Name: CHASE KNEBEL Birthdate: Nov 23, 1940 Sex: female Admission Date (Current Location): 04/25/2018  [name] and [place] Number:  Engineering geologist and Address:  [facility], [address], [place], [place] [address]      Provider Number: [id number]  Attending Physician Name and Address:  Henreitta Leber, MD  Relative Name and Phone Number:  Arthi, Mcdonald [phone]  [phone] or Corbett,Joann Relative [phone]  or Graves,Felecia Niece   [phone]     Current Level of Care: Hospital Recommended Level of Care: [place] Prior Approval Number:    Date Approved/Denied:   PASRR Number: [id number]  Discharge Plan: SNF    Current Diagnoses: Patient Active Problem List   Diagnosis Date Noted  . Acute on chronic diastolic heart failure ([place]) 04/25/2018  . Chronic heart failure with preserved ejection fraction ([place]) 04/22/2018  . Atherosclerosis of both carotid arteries 04/18/2018  . Near syncope 03/19/2018  . Functional diarrhea 03/13/2018  . Vitamin a deficiency, unspecified 02/22/2018  . Hypercalcemia 02/20/2018  . Environmental and seasonal allergies 01/15/2018  . Post-nasal drip 01/15/2018  . Restless legs 10/17/2017  . Leg pain 10/15/2017  . Normocytic anemia, not due to blood loss 09/27/2017  . Melena 09/25/2017  . Cognitive decline 08/16/2017  . Polypharmacy 07/28/2017  . Pneumonia 05/26/2017  . Pill esophagitis 05/23/2017  . CKD (chronic kidney disease) stage 4, GFR 15-29 ml/min (HCC) 11/15/2016  . History of fall 11/12/2016  . Osteoarthritis 11/10/2016  . Right arm pain 10/15/2016  . Cervical stenosis of spinal canal 10/10/2016  . Cervical radiculopathy at C7 07/02/2016  . Epigastric hernia 04/01/2016  . Umbilical pain 32/04/2481  . Coronary artery disease   . 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 Chronic renal insufficiency   . Hypertensive heart disease   . OSA on CPAP 07/07/2015  . Hospital discharge follow-up 07/07/2015  . Chest pain 06/29/2015  . Pleural effusion, right   . Chronic diastolic CHF (congestive heart failure) ([place]) 04/18/2015  . Diabetic neuropathy ([place]) 02/21/2015  . Benign neoplasm of sigmoid colon   . Low back pain with radiation 04/04/2014  . Essential hypertension 04/04/2014  . Hyperlipidemia LDL goal <70 02/23/2014  . Long-term use of high-risk medication 02/23/2014  . Other long term (current) drug therapy 02/23/2014  . Dermatophytic onychia 08/17/2013  . Stable angina ([place]) 08/17/2013  . Cerebral infarct ([place]) 08/17/2013  . Bony exostosis 08/17/2013  . Obesity, unspecified 08/17/2013  . Arthritis 08/05/2013  . Unsteadiness on feet 03/24/2013  . History of colonic polyps 02/20/2013  . Sarcoidosis ([place]) 12/07/2012  . Dysphagia 12/07/2012  . Pulmonary nodule seen on imaging study 05/20/2012  . 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 Diabetic nephropathy associated with type 2 diabetes mellitus ([place]) 02/29/2012  . Cardiomyopathy, ischemic   . Hepatic steatosis   . Osteoporosis, post-menopausal   . Peripheral vascular disease due to secondary diabetes mellitus ([place])   . Double vessel coronary artery disease 12/14/2011    Orientation RESPIRATION BLADDER Height & Weight     Self, Time, Situation, Place  Normal Continent Weight: 183 lb 6.4 oz (83.2 kg) Height:  5\' 7"  (170.2 cm)  BEHAVIORAL SYMPTOMS/MOOD NEUROLOGICAL BOWEL NUTRITION STATUS      Continent Diet(Carb Modified)  AMBULATORY STATUS COMMUNICATION OF NEEDS Skin   Limited Assist Verbally Normal  Personal Care Assistance Level of Assistance  Bathing, Feeding, Dressing Bathing Assistance: Limited assistance Feeding assistance: Independent Dressing Assistance: Limited assistance     Functional Limitations Info  Sight, Hearing, Speech Sight Info:  Adequate Hearing Info: Adequate Speech Info: Adequate    SPECIAL CARE FACTORS FREQUENCY  PT (By licensed PT), OT (By licensed OT)     PT Frequency: 5x a week OT Frequency: 5x a week            Contractures      Additional Factors Info  Psychotropic, Insulin Sliding Scale     Psychotropic Info: ALPRAZolam (XANAX) tablet 0.25 mg  Insulin Sliding Scale Info: insulin aspart (novoLOG) injection 0-15 Units 3x a day with meals       Current Medications (04/29/2018):  This is the current hospital active medication list Current Facility-Administered Medications  Medication Dose Route Frequency Provider Last Rate Last Dose  . acetaminophen (TYLENOL) tablet 650 mg  650 mg Oral Q6H PRN Harrie Foreman, MD   650 mg at 04/27/18 0448   Or  . acetaminophen (TYLENOL) suppository 650 mg  650 mg Rectal Q6H PRN Harrie Foreman, MD      . allopurinol (ZYLOPRIM) tablet 100 mg  100 mg Oral Daily Harrie Foreman, MD   100 mg at 04/29/18 9476  . ALPRAZolam [name]) tablet 0.25 mg  0.25 mg Oral Daily Harrie Foreman, MD   0.25 mg at 04/29/18 0827  . amLODipine (NORVASC) tablet 10 mg  10 mg Oral Daily Dustin Flock, MD   10 mg at 04/29/18 0827  . aspirin EC tablet 81 mg  81 mg Oral Daily Harrie Foreman, MD   81 mg at 04/29/18 0827  . dicyclomine (BENTYL) capsule 10 mg  10 mg Oral TID AC & HS Harrie Foreman, MD   10 mg at 04/29/18 0827  . diphenhydrAMINE (BENADRYL) capsule 25 mg  25 mg Oral Q6H PRN Dustin Flock, MD   25 mg at 04/27/18 1115  . diphenoxylate-atropine (LOMOTIL) 2.5-0.025 MG per tablet 1 tablet  1 tablet Oral QID PRN Harrie Foreman, MD      . docusate sodium (COLACE) capsule 100 mg  100 mg Oral BID Harrie Foreman, MD   100 mg at 04/29/18 0827  . feeding supplement (GLUCERNA SHAKE) (GLUCERNA SHAKE) liquid 237 mL  237 mL Oral BID BM Harrie Foreman, MD   237 mL at 04/29/18 0831  . fluticasone (FLONASE) 50 MCG/ACT nasal spray 2 spray  2 spray Each Nare Daily  PRN Harrie Foreman, MD      . guaiFENesin-dextromethorphan [facility] DM) 100-10 MG/5ML syrup 5 mL  5 mL Oral Q4H PRN Harrie Foreman, MD   5 mL at 04/28/18 1819  . heparin injection 5,000 Units  5,000 Units Subcutaneous Q8H Harrie Foreman, MD   5,000 Units at 04/29/18 [phone]  . hydrALAZINE (APRESOLINE) injection 10 mg  10 mg Intravenous Q6H PRN Dustin Flock, MD      . hydrALAZINE (APRESOLINE) tablet 50 mg  50 mg Oral Q8H Minna Merritts, MD   50 mg at 04/29/18 0609  . insulin aspart (novoLOG) injection 0-15 Units  0-15 Units Subcutaneous TID AC & HS Lance Coon, MD   11 Units at 04/28/18 2144  . insulin aspart (novoLOG) injection 5 Units  5 Units Subcutaneous TID WC Dustin Flock, MD   5 Units at 04/29/18 0829  . insulin glargine (LANTUS) injection 50 Units  50 Units Subcutaneous QHS  Dustin Flock, MD   50 Units at 04/28/18 2144  . ipratropium-albuterol (DUONEB) 0.5-2.5 (3) MG/3ML nebulizer solution 3 mL  3 mL Nebulization Q6H Dustin Flock, MD   3 mL at 04/29/18 0823  . isosorbide mononitrate (IMDUR) 24 hr tablet 30 mg  30 mg Oral BID Minna Merritts, MD   30 mg at 04/29/18 0828  . methylPREDNISolone sodium succinate (SOLU-MEDROL) 125 mg/2 mL injection 60 mg  60 mg Intravenous Q24H Dustin Flock, MD   60 mg at 04/29/18 0828  . morphine 2 MG/ML injection 1 mg  1 mg Intravenous Q4H PRN Harrie Foreman, MD   1 mg at 04/27/18 1501  . nitroGLYCERIN (NITROSTAT) SL tablet 0.4 mg  0.4 mg Sublingual Q5 min PRN Harrie Foreman, MD      . ondansetron [facility]) tablet 4 mg  4 mg Oral Q6H PRN Harrie Foreman, MD       Or  . ondansetron [facility]) injection 4 mg  4 mg Intravenous Q6H PRN Harrie Foreman, MD      . oxyCODONE-acetaminophen (PERCOCET/ROXICET) 5-325 MG per tablet 1 tablet  1 tablet Oral Q4H PRN Henreitta Leber, MD   1 tablet at 04/29/18 0827  . protein supplement (PREMIER PROTEIN) liquid - approved for s/p bariatric surgery  11 oz Oral BID BM Dustin Flock, MD    11 oz at 04/28/18 1400  . rosuvastatin (CRESTOR) tablet 20 mg  20 mg Oral Daily Harrie Foreman, MD   20 mg at 04/29/18 0827  . ticagrelor (BRILINTA) tablet 60 mg  60 mg Oral BID Harrie Foreman, MD   60 mg at 04/29/18 0827   Facility-Administered Medications Ordered in Other Encounters  Medication Dose Route Frequency Provider Last Rate Last Dose  . albuterol (PROVENTIL) (5 MG/ML) 0.5% nebulizer solution 2.5 mg  2.5 mg Nebulization Once [name], MD         Discharge Medications: Please see discharge summary for a list of discharge medications.  Relevant Imaging Results:  Relevant Lab Results:   Additional Information SSN: [id number]  Anell Barr

## 2018-04-29 NOTE — Progress Notes (Signed)
.   Progress Note  Patient [name] Date of Encounter: 04/29/2018  Primary Cardiologist: Ida Rogue, MD   Subjective   Feels very cold on today's visit, Room temperature turned down, but dusted it back up for her Tremor, shivering Extra blanket placed, " feet are cold" Continued mild musculoskeletal chest pain on the left, reproducible with palpation Denies significant shortness of breath or leg swelling Worsening tremor, debility  Inpatient Medications    Scheduled Meds: . allopurinol  100 mg Oral Daily  . ALPRAZolam  0.25 mg Oral Daily  . amLODipine  10 mg Oral Daily  . aspirin EC  81 mg Oral Daily  . dicyclomine  10 mg Oral TID AC & HS  . docusate sodium  100 mg Oral BID  . feeding supplement (GLUCERNA SHAKE)  237 mL Oral BID BM  . heparin  5,000 Units Subcutaneous Q8H  . hydrALAZINE  50 mg Oral Q8H  . insulin aspart  0-15 Units Subcutaneous TID AC & HS  . insulin aspart  5 Units Subcutaneous TID WC  . insulin glargine  50 Units Subcutaneous QHS  . ipratropium-albuterol  3 mL Nebulization Q6H  . isosorbide mononitrate  30 mg Oral BID  . methylPREDNISolone (SOLU-MEDROL) injection  60 mg Intravenous Q24H  . protein supplement shake  11 oz Oral BID BM  . rosuvastatin  20 mg Oral Daily  . ticagrelor  60 mg Oral BID   Continuous Infusions:  PRN Meds: acetaminophen **OR** acetaminophen, diphenhydrAMINE, diphenoxylate-atropine, fluticasone, guaiFENesin-dextromethorphan, hydrALAZINE, morphine injection, nitroGLYCERIN, ondansetron **OR** ondansetron (ZOFRAN) IV, oxyCODONE-acetaminophen   Vital Signs    Vitals:   04/29/18 0536 04/29/18 0726 04/29/18 0823 04/29/18 1429  BP: (!) 157/88 (!) 187/67  (!) 163/62  Pulse: 82 89  94  Resp: 17     Temp: 98.4 F (36.9 C) 98.5 F (36.9 C)    TempSrc: Oral Oral    SpO2: 98% 99% 95%   Weight:      Height:        Intake/Output Summary (Last 24 hours) at 04/29/2018 1658 Last data filed at 04/29/2018  1130 Gross per 24 hour  Intake -  Output 1400 ml  Net -1400 ml   Filed Weights   04/27/18 0445 04/28/18 0319 04/29/18 0500  Weight: 85.2 kg 84.9 kg 83.2 kg    Telemetry    Normal sinus rhythm, PVCs noted rate 85 bpm- Personally Reviewed  ECG    No new tracings- Personally Reviewed  Physical Exam   Constitutional:  oriented , tremor, otherwise no distress HENT:  Head: Normocephalic and atraumatic.  Eyes:  no discharge. No scleral icterus.  Neck: Normal range of motion. Neck supple. No JVD present.  Cardiovascular: Normal rate, regular rhythm, normal heart sounds and intact distal pulses. Ectopy appreciated  exam reveals no gallop and no friction rub. No edema No murmur heard. Pulmonary/Chest: Effort normal and breath sounds normal. No stridor. No respiratory distress.  no wheezes.  no rales.  no tenderness.  Abdominal: Soft.  no distension.  no tenderness.  Musculoskeletal: Normal range of motion.  no  tenderness or deformity.  Neurological:  normal muscle tone. Coordination normal. No atrophy Skin: Skin is warm and dry. No rash noted. not diaphoretic.  Psychiatric:  normal mood and affect. behavior is normal. Thought content normal.      Labs    Chemistry Recent Labs  Lab 04/25/18 0130  04/27/18 0525 04/28/18 0336 04/29/18 0342  NA 138   < > 136  138 140  K 3.2*   < > 3.9 4.1 3.9  CL 106   < > 101 103 104  CO2 24   < > 28 27 28   GLUCOSE 180*   < > 239* 276* 145*  BUN 15   < > 45* 45* 45*  CREATININE 1.18*   < > 1.60* 1.55* 1.35*  CALCIUM 9.9   < > 10.1 10.4* 10.7*  PROT 7.5  --   --   --   --   ALBUMIN 3.5  --   --   --   --   AST 22  --   --   --   --   ALT 15  --   --   --   --   ALKPHOS 61  --   --   --   --   BILITOT 0.6  --   --   --   --   GFRNONAA 44*   < > 31* 32* 38*  GFRAA 52*   < > 36* 37* 44*  ANIONGAP 8   < > 7 8 8    < > = values in this interval not displayed.     Hematology Recent Labs  Lab 04/25/18 0130 04/28/18 0336  04/29/18 0342  WBC 7.8 12.4* 12.3*  RBC 3.68* 3.75* 3.82*  HGB 10.4* 10.6* 10.8*  HCT 33.2* 33.8* 34.1*  MCV 90.2 90.1 89.3  MCH 28.3 28.3 28.3  MCHC 31.3 31.4 31.7  RDW 15.4 15.7* 15.9*  PLT 355 387 390    Cardiac Enzymes Recent Labs  Lab 04/25/18 0130 04/25/18 0743 04/25/18 1325 04/25/18 1820  TROPONINI 0.03* 0.03* 0.03* 0.03*   No results for input(s): TROPIPOC in the last 168 hours.   BNP Recent Labs  Lab 04/25/18 0130  BNP 282.0*     DDimer No results for input(s): DDIMER in the last 168 hours.   Radiology    No results found.  Cardiac Studies   Pending updated echo performed 12/17  05/24/2017 TTE Study Conclusions - Left ventricle: The cavity size was normal. There was mild to moderate concentric hypertrophy. Systolic function was normal. The estimated ejection fraction was in the range of 60% to 65%. Wall motion was normal; there were no regional wall motion abnormalities. Features are consistent with a pseudonormal left ventricular filling pattern, with concomitant abnormal relaxation and increased filling pressure (grade 2 diastolic dysfunction). - Mitral valve: Calcified annulus. - Left atrium: The atrium was normal in size. - Right ventricle: Systolic function was normal. - Pulmonary arteries: Systolic pressure was within the normal range.  01/29/2016 NM Study Pharmacological myocardial perfusion imaging study with no significant ischemia Normal wall motion, EF estimated at 55% No EKG changes concerning for ischemia at peak stress or in recovery. Baseline EKG with IVCD and LAFB Low risk scan  05/2011 Cath Cardiac catheterization from January 2013 documenting ejection fraction 35-40%, 40% mid LAD, 50% distal LAD, small diagonal with 70% disease, 50% proximal left circumflex, 75% distal left circumflex with patent stent, small RCA, calcified, severe atherosclerosis, 90% distal RCA  *Details from catheterization March 2016 are  not available, request made to hospital in [place] on today's visit for records  Patient Profile     77 y.o. 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  Assessment & Plan    Acute on Chronic HFpEF Suspect much of her shortness of breath is from deconditioning, though unable to exclude poorly controlled hypertension as demonstrated this hospital admission Lasix has been on hold for several days, secondary to climbing creatinine Denies significant shortness of breath -She does have history of HFpEF, anemia, known COPD, and pulmonary sarcoidosis. -Concern for medication compliance at home -We will likely need low-dose Lasix when she is discharged  CAD with h/o PCI   Atypical chest pain, reproducible on palpation left pectoral region Would continue tramadol for musculoskeletal pain No ischemic work-up needed  HTN  Imdur increased up to 30 twice daily yesterday also increased hydralazine up to 50 every 8 Pressures down to 160s --Suspect she may need hydralazine 100 every 8  HLD crestor 20mg  daily  CKDIII Lasix has been held for renal dysfunction, prerenal state At the time of discharge, may need to restart Lasix 20 mg 3 times a week  Anemia Stable hemoglobin of 10.9  DM2 Hemoglobin A1c 7.7 for the past year Adequate  COPD  Continue breathing treatments, [place] O2, CPAP as tolerated.   home CPAP as tolerated for breathing. Shortness of breath exacerbated by deconditioning, morbid obesity  OSA CPAP  Dispo: Would recommend rehab If no significant improvement in conditioning at rehab may need placement   Total encounter time more than 25 minutes  Greater than 50% was spent in counseling and coordination of care with the  patient   For questions or updates, please contact [place] Please consult [email or web address] for contact info under        Signed, Ida Rogue, MD  04/29/2018, 4:58 PM

## 2018-04-29 NOTE — Progress Notes (Signed)
Patient off cpap and on room air prior to my arrival. Patient adamant about wearing cpap for just 6 hours a night.

## 2018-04-29 NOTE — Clinical Social Work Note (Addendum)
CSW spoke to patient and her husband to present bed offers, they would like [facility], [place] contacted [facility], and can accept patient once she is medically ready for discharge.  Patient is on the [facility] SNF 3 day Medicare waiver program, CSW contacted [facility] to see if patient is able to discharge to SNF without qualifying stay, [facility] said they will review patient's information and contact CSW back once they have made a decision.  CSW awaiting for call back from [facility].  CSW spoke to [facility], and they can accept patient tomorrow if she is medically ready for discharge and orders have been received, CSW updated physician and patient's husband.  Jones Broom. Hartley, MSW, [place]  04/29/2018 12:15 PM

## 2018-04-29 NOTE — Progress Notes (Signed)
[facility] [place] [name]    MR#:  [id number]  DATE OF BIRTH:  1941/03/18  SUBJECTIVE:   Patient continues to complain of some vague chest pain and back pain.  No wheezing or bronchospasm.  No other acute events overnight.  Seen By physical therapy and they recommend short-term rehab and social work aware.  REVIEW OF SYSTEMS:    Review of Systems  Constitutional: Negative for chills and fever.  HENT: Negative for congestion and tinnitus.   Eyes: Negative for blurred vision and double vision.  Respiratory: Negative for cough, shortness of breath and wheezing.   Cardiovascular: Positive for chest pain. Negative for orthopnea and PND.  Gastrointestinal: Negative for abdominal pain, diarrhea, nausea and vomiting.  Genitourinary: Negative for dysuria and hematuria.  Musculoskeletal: Positive for back pain.  Neurological: Negative for dizziness, sensory change and focal weakness.  All other systems reviewed and are negative.    Nutrition: heart healthy/Carb modified Tolerating Diet: Yes Tolerating PT: eval noted.   DRUG ALLERGIES:   Allergies  Allergen Reactions  . Cephalexin Itching  . Contrast Media [Iodinated Diagnostic Agents] Itching  . Doxycycline Itching  . Gabapentin Itching  . Sulfa Antibiotics Itching    VITALS:  Blood pressure (!) 163/62, pulse 94, temperature 98.5 F (36.9 C), temperature source Oral, resp. rate 17, height 5\' 7"  (1.702 m), weight 83.2 kg, SpO2 95 %.  PHYSICAL EXAMINATION:   Physical Exam  GENERAL:  77 y.o.-year-old patient lying in bed in NAD tremulous & anxious.   EYES: Pupils equal, round, reactive to light and accommodation. No scleral icterus. Extraocular muscles intact.  HEENT: Head atraumatic, normocephalic. Oropharynx and nasopharynx clear.  NECK:  Supple, no jugular venous distention. No thyroid enlargement, no tenderness.  LUNGS: Poor Resp. effort, no wheezing, rales,  rhonchi. No use of accessory muscles of respiration.  CARDIOVASCULAR: S1, S2 normal. No murmurs, rubs, or gallops.  ABDOMEN: Soft, nontender, nondistended. Bowel sounds present. No organomegaly or mass.  EXTREMITIES: No cyanosis, clubbing or edema b/l.    NEUROLOGIC: Cranial nerves II through XII are intact. No focal Motor or sensory deficits b/l. Globally weak and baseline essential Tremor PSYCHIATRIC: The patient is alert and oriented x 3.  SKIN: No obvious rash, lesion, or ulcer.    LABORATORY PANEL:   CBC Recent Labs  Lab 04/29/18 0342  WBC 12.3*  HGB 10.8*  HCT 34.1*  PLT 390   ------------------------------------------------------------------------------------------------------------------  Chemistries  Recent Labs  Lab 04/25/18 0130  04/29/18 0342  NA 138   < > 140  K 3.2*   < > 3.9  CL 106   < > 104  CO2 24   < > 28  GLUCOSE 180*   < > 145*  BUN 15   < > 45*  CREATININE 1.18*   < > 1.35*  CALCIUM 9.9   < > 10.7*  AST 22  --   --   ALT 15  --   --   ALKPHOS 61  --   --   BILITOT 0.6  --   --    < > = values in this interval not displayed.   ------------------------------------------------------------------------------------------------------------------  Cardiac Enzymes Recent Labs  Lab 04/25/18 1820  TROPONINI 0.03*   ------------------------------------------------------------------------------------------------------------------  RADIOLOGY:  No results found.   ASSESSMENT AND PLAN:   77 year old female with past medical history of COPD, coronary artery disease status post previous stent placement, hyperlipidemia, CHF, gout, anxiety,  hypertension who presented to the hospital due to shortness of breath.  1.  Acute respiratory failure with hypoxia-secondary to COPD exacerbation. - Much improved, no wheezing or bronchospasm.  Continue O2 supplementation. -Continue IV steroids and will switch to Oral Pred taper in a.m., cont. scheduled nebs and  antitussives.  2.  COPD exacerbation-this is a source of patient's worsening respiratory failure and hypoxia. - Continue treatment as mentioned above.  3.  Chest pain-seems more musculoskeletal in nature. - Seen by cardiology no plans for acute cardiac intervention.  Cardiac markers are negative. Will d/c Tele. -cont. Percocet PRN for now.   4.  Diabetes type 2 without complication-continue Lantus, NovoLog with meals with sliding scale insulin coverage.  5.  Essential hypertension-continue hydralazine, Norvasc, Imdur.  6.  Hyperlipidemia-continue Crestor.  7.  History of coronary artery disease-no evidence of acute coronary syndrome. -Continue Brilinta, aspirin, Crestor.  Appreciate cardiology input.  Continue Imdur  8.  History of gout-no acute attack.  Continue allopurinol.  9.  Anxiety-continue Xanax.  Appreciate PT eval and pt. Will likely need SNF/STR.  Discussed with Social Work and will discharge to [facility].   All the records are reviewed and case discussed with Care Management/Social Worker. Management plans discussed with the patient, family and they are in agreement.  CODE STATUS: Full code  DVT Prophylaxis: Hep. SQ  TOTAL TIME TAKING CARE OF THIS PATIENT: 30 minutes.   POSSIBLE D/C IN 1-2 DAYS, DEPENDING ON CLINICAL CONDITION.   Henreitta Leber M.D on 04/29/2018 at 3:40 PM  Between 7am to 6pm - Pager - [phone]  After 6pm go to [email or web address] - Proofreader  [facility]  Office  [phone]  CC: Primary care physician; [name], MD

## 2018-04-29 NOTE — Clinical Social Work Note (Signed)
Clinical Social Work Assessment  Patient Details  [name] MRN: [id number] Date of Birth: 1940/12/31  Date of referral:  04/29/18               Reason for consult:  Facility Placement                Permission sought to share information with:  Facility Sport and exercise psychologist, Family Supports Permission granted to share information::  Yes, Verbal Permission Granted  Name::     Pamela Collier, Pamela Collier [phone]  [phone] or Pamela Collier,Pamela Collier Relative [phone] or Pamela Collier,Pamela Collier Niece   [phone]   Agency::  SNF admissions  Relationship::     Contact Information:     Housing/Transportation Living arrangements for the past 2 months:  [place], [place] of Information:  Patient, Adult Children, Facility Patient Interpreter Needed:  None Criminal Activity/Legal Involvement Pertinent to Current Situation/Hospitalization:  No - Comment as needed Significant Relationships:  Adult Children, Spouse Lives with:  Spouse Do you feel safe going back to the place where you live?  No Need for family participation in patient care:  Yes (Comment)  Care giving concerns:  Patient and husband feel that she needs some short term rehab before she is able to return back home.   Social Worker assessment / plan:  Patient is a 77 year old female who is alert and oriented x4 and lives with her husband.  Patient states she was just recently discharged from [facility], patient requests that she return to SNF for short term rehab.  Patient asked CSW so speak to her husband in regards to Medicare benefits, CSW contacted patient's husband and he was informed that patient's days do not start over, because she has not been out of the hospital for 60 days.  CSW explained how insurance would pay for stay, and explained that she would be in copay days, but her secondary insurance should cover her cost.  Patient was familiar with the process of looking for SNF and  knows what to expect.  Patient and her husband did not express any other questions or concerns.  Employment status:  Retired Forensic scientist:  Medicare PT Recommendations:  [place] / Referral to community resources:  [place]  Patient/Family's Response to care: Patient and husband are in agreement to going to SNF for short term rehab.  Patient/Family's Understanding of and Emotional Response to Diagnosis, Current Treatment, and Prognosis:  Patient and husband are hopeful that she will not have to be in SNF for very long.  Emotional Assessment Appearance:  Appears stated age Attitude/Demeanor/Rapport:    Affect (typically observed):  Appropriate Orientation:  Oriented to Place, Oriented to Self, Oriented to  Time, Oriented to Situation Alcohol / Substance use:  Not Applicable Psych involvement (Current and /or in the community):  No (Comment)  Discharge Needs  Concerns to be addressed:  Care Coordination, Lack of Support Readmission within the last 30 days:  No Current discharge risk:  Lack of support system Barriers to Discharge:  Continued Medical Work up, [employer]   Ross Ludwig, [place] 04/29/2018, 12:07 PM

## 2018-04-30 ENCOUNTER — Ambulatory Visit: Payer: Self-pay | Admitting: *Deleted

## 2018-04-30 DIAGNOSIS — Z794 Long term (current) use of insulin: Secondary | ICD-10-CM | POA: Diagnosis not present

## 2018-04-30 DIAGNOSIS — I5031 Acute diastolic (congestive) heart failure: Secondary | ICD-10-CM | POA: Diagnosis not present

## 2018-04-30 DIAGNOSIS — I5032 Chronic diastolic (congestive) heart failure: Secondary | ICD-10-CM | POA: Diagnosis not present

## 2018-04-30 DIAGNOSIS — L84 Corns and callosities: Secondary | ICD-10-CM | POA: Diagnosis not present

## 2018-04-30 DIAGNOSIS — E1140 Type 2 diabetes mellitus with diabetic neuropathy, unspecified: Secondary | ICD-10-CM | POA: Diagnosis not present

## 2018-04-30 DIAGNOSIS — Z882 Allergy status to sulfonamides status: Secondary | ICD-10-CM | POA: Diagnosis not present

## 2018-04-30 DIAGNOSIS — R41841 Cognitive communication deficit: Secondary | ICD-10-CM | POA: Diagnosis not present

## 2018-04-30 DIAGNOSIS — E1122 Type 2 diabetes mellitus with diabetic chronic kidney disease: Secondary | ICD-10-CM | POA: Diagnosis not present

## 2018-04-30 DIAGNOSIS — Z7982 Long term (current) use of aspirin: Secondary | ICD-10-CM | POA: Diagnosis not present

## 2018-04-30 DIAGNOSIS — Z91041 Radiographic dye allergy status: Secondary | ICD-10-CM | POA: Diagnosis not present

## 2018-04-30 DIAGNOSIS — F418 Other specified anxiety disorders: Secondary | ICD-10-CM | POA: Diagnosis not present

## 2018-04-30 DIAGNOSIS — M6281 Muscle weakness (generalized): Secondary | ICD-10-CM | POA: Diagnosis not present

## 2018-04-30 DIAGNOSIS — I10 Essential (primary) hypertension: Secondary | ICD-10-CM | POA: Diagnosis not present

## 2018-04-30 DIAGNOSIS — J9601 Acute respiratory failure with hypoxia: Secondary | ICD-10-CM | POA: Diagnosis not present

## 2018-04-30 DIAGNOSIS — E1151 Type 2 diabetes mellitus with diabetic peripheral angiopathy without gangrene: Secondary | ICD-10-CM | POA: Diagnosis not present

## 2018-04-30 DIAGNOSIS — N183 Chronic kidney disease, stage 3 (moderate): Secondary | ICD-10-CM | POA: Diagnosis not present

## 2018-04-30 DIAGNOSIS — I255 Ischemic cardiomyopathy: Secondary | ICD-10-CM | POA: Diagnosis not present

## 2018-04-30 DIAGNOSIS — D860 Sarcoidosis of lung: Secondary | ICD-10-CM | POA: Diagnosis not present

## 2018-04-30 DIAGNOSIS — K5909 Other constipation: Secondary | ICD-10-CM | POA: Diagnosis not present

## 2018-04-30 DIAGNOSIS — I6523 Occlusion and stenosis of bilateral carotid arteries: Secondary | ICD-10-CM | POA: Diagnosis not present

## 2018-04-30 DIAGNOSIS — J449 Chronic obstructive pulmonary disease, unspecified: Secondary | ICD-10-CM | POA: Diagnosis not present

## 2018-04-30 DIAGNOSIS — R198 Other specified symptoms and signs involving the digestive system and abdomen: Secondary | ICD-10-CM | POA: Diagnosis not present

## 2018-04-30 DIAGNOSIS — J398 Other specified diseases of upper respiratory tract: Secondary | ICD-10-CM | POA: Diagnosis not present

## 2018-04-30 DIAGNOSIS — I890 Lymphedema, not elsewhere classified: Secondary | ICD-10-CM | POA: Diagnosis not present

## 2018-04-30 DIAGNOSIS — Z8249 Family history of ischemic heart disease and other diseases of the circulatory system: Secondary | ICD-10-CM | POA: Diagnosis not present

## 2018-04-30 DIAGNOSIS — Z7952 Long term (current) use of systemic steroids: Secondary | ICD-10-CM | POA: Diagnosis not present

## 2018-04-30 DIAGNOSIS — I5033 Acute on chronic diastolic (congestive) heart failure: Secondary | ICD-10-CM | POA: Diagnosis not present

## 2018-04-30 DIAGNOSIS — Z79899 Other long term (current) drug therapy: Secondary | ICD-10-CM | POA: Diagnosis not present

## 2018-04-30 DIAGNOSIS — R5381 Other malaise: Secondary | ICD-10-CM | POA: Diagnosis not present

## 2018-04-30 DIAGNOSIS — R4182 Altered mental status, unspecified: Secondary | ICD-10-CM | POA: Diagnosis not present

## 2018-04-30 DIAGNOSIS — Z9049 Acquired absence of other specified parts of digestive tract: Secondary | ICD-10-CM | POA: Diagnosis not present

## 2018-04-30 DIAGNOSIS — E782 Mixed hyperlipidemia: Secondary | ICD-10-CM | POA: Diagnosis not present

## 2018-04-30 DIAGNOSIS — I130 Hypertensive heart and chronic kidney disease with heart failure and stage 1 through stage 4 chronic kidney disease, or unspecified chronic kidney disease: Secondary | ICD-10-CM | POA: Diagnosis not present

## 2018-04-30 DIAGNOSIS — Z7401 Bed confinement status: Secondary | ICD-10-CM | POA: Diagnosis not present

## 2018-04-30 DIAGNOSIS — E1149 Type 2 diabetes mellitus with other diabetic neurological complication: Secondary | ICD-10-CM | POA: Diagnosis not present

## 2018-04-30 DIAGNOSIS — D72828 Other elevated white blood cell count: Secondary | ICD-10-CM | POA: Diagnosis not present

## 2018-04-30 DIAGNOSIS — Z881 Allergy status to other antibiotic agents status: Secondary | ICD-10-CM | POA: Diagnosis not present

## 2018-04-30 DIAGNOSIS — G4730 Sleep apnea, unspecified: Secondary | ICD-10-CM | POA: Diagnosis not present

## 2018-04-30 DIAGNOSIS — M1990 Unspecified osteoarthritis, unspecified site: Secondary | ICD-10-CM | POA: Diagnosis not present

## 2018-04-30 DIAGNOSIS — M2550 Pain in unspecified joint: Secondary | ICD-10-CM | POA: Diagnosis not present

## 2018-04-30 DIAGNOSIS — I509 Heart failure, unspecified: Secondary | ICD-10-CM | POA: Diagnosis not present

## 2018-04-30 DIAGNOSIS — I2510 Atherosclerotic heart disease of native coronary artery without angina pectoris: Secondary | ICD-10-CM | POA: Diagnosis not present

## 2018-04-30 DIAGNOSIS — Z888 Allergy status to other drugs, medicaments and biological substances status: Secondary | ICD-10-CM | POA: Diagnosis not present

## 2018-04-30 DIAGNOSIS — R05 Cough: Secondary | ICD-10-CM | POA: Diagnosis not present

## 2018-04-30 DIAGNOSIS — Z8673 Personal history of transient ischemic attack (TIA), and cerebral infarction without residual deficits: Secondary | ICD-10-CM | POA: Diagnosis not present

## 2018-04-30 DIAGNOSIS — J441 Chronic obstructive pulmonary disease with (acute) exacerbation: Secondary | ICD-10-CM | POA: Diagnosis not present

## 2018-04-30 DIAGNOSIS — H538 Other visual disturbances: Secondary | ICD-10-CM | POA: Diagnosis not present

## 2018-04-30 DIAGNOSIS — Z89421 Acquired absence of other right toe(s): Secondary | ICD-10-CM | POA: Diagnosis not present

## 2018-04-30 LAB — GLUCOSE, CAPILLARY
Glucose-Capillary: 94 mg/dL (ref 70–99)
Glucose-Capillary: 249 mg/dL — ABNORMAL HIGH (ref 70–99)
Glucose-Capillary: 254 mg/dL — ABNORMAL HIGH (ref 70–99)

## 2018-04-30 MED ORDER — PREDNISONE 10 MG PO TABS: ORAL_TABLET | ORAL | Status: DC

## 2018-04-30 MED ORDER — HYDRALAZINE HCL 50 MG PO TABS: 100.0000 mg | ORAL_TABLET | Freq: Three times a day (TID) | ORAL | Status: DC

## 2018-04-30 MED ORDER — HYDRALAZINE HCL 100 MG PO TABS: 100.0000 mg | ORAL_TABLET | Freq: Three times a day (TID) | ORAL | Status: DC

## 2018-04-30 MED ORDER — HYDRALAZINE HCL 50 MG PO TABS
50.0000 mg | ORAL_TABLET | Freq: Once | ORAL | Status: AC
  Administered 2018-04-30: 50 mg via ORAL
  Filled 2018-04-30: qty 1

## 2018-04-30 MED ORDER — AMLODIPINE BESYLATE 5 MG PO TABS: 10.0000 mg | ORAL_TABLET | Freq: Every day | ORAL | 1 refills | Status: DC

## 2018-04-30 MED ORDER — ALPRAZOLAM 0.25 MG PO TABS: 0.2500 mg | ORAL_TABLET | Freq: Every day | ORAL | 0 refills | Status: DC

## 2018-04-30 MED ORDER — GUAIFENESIN-DM 100-10 MG/5ML PO SYRP: 5.0000 mL | ORAL_SOLUTION | ORAL | 0 refills | Status: DC | PRN

## 2018-04-30 MED ORDER — OXYCODONE-ACETAMINOPHEN 5-325 MG PO TABS: 1.0000 | ORAL_TABLET | ORAL | 0 refills | Status: DC | PRN

## 2018-04-30 NOTE — Progress Notes (Signed)
[name] to be D/C'd Rehab ([facility]) per MD order.  Report given to [facility], LPN.  Husband [name] notified of discharge.    Vitals:   04/30/18 1731 04/30/18 1740  BP: (!) 163/65 (!) 155/64  Pulse: 81 82  Resp: 18   Temp: 98.4 F (36.9 C)   SpO2: 96%    Skin clean, dry and intact without evidence of skin break down, no evidence of skin tears noted. IV catheter discontinued intact. Site without signs and symptoms of complications. Dressing and pressure applied. Pt denies pain at this time. No complaints noted.   Patient escorted via stretcher, and D/C home via EMS  Pamela Collier

## 2018-04-30 NOTE — Care Management Important Message (Signed)
Initial Medicare IM signed, copy left in room for reference.

## 2018-04-30 NOTE — Clinical Social Work Placement (Signed)
   CLINICAL SOCIAL WORK PLACEMENT  NOTE  Date:  04/30/2018  Patient Details  [name] MRN: [id number] Date of Birth: 01/15/1941  Clinical Social Work is seeking post-discharge placement for this patient at the [place] level of care (*CSW will initial, date and re-position this form in  chart as items are completed):  Yes   Patient/family provided with [place] Work Department's list of facilities offering this level of care within the geographic area requested by the patient (or if unable, by the patient's family).  Yes   Patient/family informed of their freedom to choose among providers that offer the needed level of care, that participate in Medicare, Medicaid or managed care program needed by the patient, have an available bed and are willing to accept the patient.  Yes   Patient/family informed of [facility]'s ownership interest in [facility] and [facility], as well as of the fact that they are under no obligation to receive care at these facilities.  PASRR submitted to EDS on 04/28/18     PASRR number received on       Existing PASRR number confirmed on 04/28/18     FL2 transmitted to all facilities in geographic area requested by pt/family on       FL2 transmitted to all facilities within larger geographic area on       Patient informed that his/her managed care company has contracts with or will negotiate with certain facilities, including the following:        Yes   Patient/family informed of bed offers received.  Patient chooses bed at [facility]     Physician recommends and patient chooses bed at      Patient to be transferred to [facility] on 04/30/18.  Patient to be transferred to facility by [facility]     Patient family notified on 04/30/18 of transfer.  Name of family member notified:  [name] patient's husband     PHYSICIAN Please sign FL2, Please prepare  prescriptions     Additional Comment:    _______________________________________________ Ross Ludwig, LCSWA 04/30/2018, 10:27 AM

## 2018-04-30 NOTE — Discharge Summary (Signed)
[facility] [place] [facility]   PATIENT [name]    MR#:  [id number]  DATE OF BIRTH:  12/03/1940  DATE OF ADMISSION:  04/25/2018 ADMITTING PHYSICIAN: Michael S Diamond, MD  DATE OF DISCHARGE: 04/30/2018  PRIMARY CARE PHYSICIAN: Tullo, Teresa L, MD    ADMISSION DIAGNOSIS:  Respiratory distress [R06.03] COPD exacerbation (HCC) [J44.1] Acute on chronic congestive heart failure, unspecified heart failure type (HCC) [I50.9]  DISCHARGE DIAGNOSIS:  Active Problems:   Acute on chronic diastolic heart failure (HCC)   SECONDARY DIAGNOSIS:   Past Medical History:  Diagnosis Date  . Acute respiratory failure (HCC) 11/21/2014  . Anxiety   . Arthritis 08/05/2013  . 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 Cardiomyopathy, ischemic    a. EF 35 to 40% by echo in 2013 b. EF improved to 50-55% by echo in 04/2015; 11/2016 Echo: EF 55-60%, Gr1 DD, mildly dil LA, nl RV fxn.  . Cerebral infarct (HCC) 08/17/2013  . Chronic diastolic CHF (congestive heart failure) (HCC)    a. EF 50-55% by echo in 04/2015; b. 11/2016 Echo: EF 55-60%, Gr1 DD.  . COPD (chronic obstructive pulmonary disease) (HCC)   . Coronary artery disease, occlusive    a. Previous PCI to the LAD, LCx, and RCA in 2010, 2011, 2013, and 2016. All performed in [place]; b. 11/2016 Lexiscan MV: EF 43%, no ischemia->low risk.  . 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 Diabetic diarrhea (HCC) 10/03/2014  . 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 Hepatic steatosis    by CT abd pelvis  . History of kidney stones    at a younger age  . Hyperlipidemia LDL goal <100 02/23/2014  . Hypertensive heart disease   . Osteoporosis, post-menopausal   . 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 Pill esophagitis   . Pleural effusion 10/25/2012   10/2012 CT chest >> small to  moderate R lung effusion>> chylothorax, 100% lymphs 10/2013 thoracentesis> cytology negative, WBC 1471, > 90% "small lymphs" 01/2014 CT chest> near complete resolution of pleural effusion, stable lymphadenopathy 08/2014 CT chest [place] ([facility]): small right sided effusion decreased in size, stable mediastinal lymphadenopathy 1.0cm largest, 2m  . 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 Sleep apnea   . Stage III chronic kidney disease (HCC)     HOSPITAL COURSE:   76-year-old female with past medical history of COPD, coronary artery disease status post previous stent placement, hyperlipidemia, CHF, gout, anxiety, hypertension who presented to the hospital due to shortness of breath.  1.  Acute respiratory failure with hypoxia-secondary to COPD exacerbation. Patient was treated with IV steroids, scheduled duo nebs and antitussives.  She has improved, she has no wheezing or bronchospasm.  She will be discharged on oral prednisone taper and maintenance of her inhalers.    2.  COPD exacerbation-this is a source of patient's worsening respiratory failure and hypoxia. -Patient was treated as mentioned above it has improved.  She is no longer hypoxic.  She has less wheezing and bronchospasm.  3.  Chest pain- this was thought to be cardiac   in nature, cardiology consult was obtained.  Patient had cardiac markers checked which were negative.  As per cardiology this was more musculoskeletal in nature.  Telemetry was discontinued.  Patient was treated for her chest pain with some tramadol and as needed Percocet and some IV morphine.  She continues to complain of vague chest pain which is likely musculoskeletal.  She will be discharged on some Percocet as  needed.  4.  Diabetes type 2 without complication- pt's BS were somewhat elevated due to her being on IV steroids.  She was on Lantus and SSI while in the hospital but will resume her Novolog 70/30 upon discharge.   5.  Essential hypertension- patient's blood pressure was a little labile and hypertensive regimen has been adjusted. - Patient will increase her hydralazine dose, Norvasc dose is also been increased.  She will continue her Imdur.  Further adjustments to her blood pressure meds can be done as an outpatient.  6.  Hyperlipidemia- pt. Will continue Crestor.  7.  History of coronary artery disease-no evidence of acute coronary syndrome while in the hospital. - she will Continue Brilinta, aspirin, Crestor, Imdur  8.  History of gout-no acute attack.  she will Continue allopurinol.  9.  Anxiety- she will continue Xanax.  Due to her significant deconditioning patient is being discharged to short-term rehab for physical therapy.  Patient and family were in agreement with this plan.  DISCHARGE CONDITIONS:   Stable  CONSULTS OBTAINED:  Treatment Team:  Arida, Muhammad A, MD  DRUG ALLERGIES:   Allergies  Allergen Reactions  . Cephalexin Itching  . Contrast Media [Iodinated Diagnostic Agents] Itching  . Doxycycline Itching  . Gabapentin Itching  . Sulfa Antibiotics Itching    DISCHARGE MEDICATIONS:   Allergies as of 04/30/2018      Reactions   Cephalexin Itching   Contrast Media [iodinated Diagnostic Agents] Itching   Doxycycline Itching   Gabapentin Itching   Sulfa Antibiotics Itching      Medication List    TAKE these medications   acetaminophen 500 MG tablet Commonly known as:  TYLENOL Take 1,000 mg by mouth every 6 (six) hours as needed for mild pain or headache.   allopurinol 100 MG tablet Commonly known as:  ZYLOPRIM Take 1 tablet (100 mg total) by mouth daily.   ALPRAZolam 0.25 MG tablet Commonly known as:  XANAX Take 1 tablet (0.25 mg total)  by mouth daily.   amLODipine 5 MG tablet Commonly known as:  NORVASC Take 2 tablets (10 mg total) by mouth daily. What changed:  how much to take   aspirin EC 81 MG tablet Take 81 mg by mouth daily.   B-D UF III MINI PEN NEEDLES 31G X 5 MM Misc Generic drug:  Insulin Pen Needle USE THREE TIMES A DAY   blood glucose meter kit and supplies Dispense based on patient and insurance preference. Use up to three times daily as directed. (FOR ICD-10 E11.21)   dicyclomine 10 MG capsule Commonly known as:  BENTYL Take 1 capsule (10 mg total) by mouth 4 (four) times daily -  before meals and at bedtime.   diphenoxylate-atropine 2.5-0.025 MG tablet Commonly known as:  LOMOTIL Take 1 tablet by mouth 4 (four) times daily as needed for diarrhea or loose stools.   feeding supplement (GLUCERNA SHAKE) Liqd Take 237 mLs by mouth 2 (two) times daily between meals.   fluticasone 50 MCG/ACT nasal spray Commonly known as:  FLONASE Place 2 sprays into both nostrils   daily as needed for rhinitis.   gabapentin 100 MG capsule Commonly known as:  NEURONTIN Take 1 capsule (100 mg total) by mouth 3 (three) times daily.   glucose blood test strip Use to check blood sugars 2 times daily   glucose blood test strip Commonly known as:  ACCU-CHEK AVIVA PLUS USE TO CHECK GLUCOSE THREE TIMES DAILY   guaiFENesin-dextromethorphan 100-10 MG/5ML syrup Commonly known as:  ROBITUSSIN DM Take 5 mLs by mouth every 4 (four) hours as needed for cough.   hydrALAZINE 100 MG tablet Commonly known as:  APRESOLINE Take 1 tablet (100 mg total) by mouth every 8 (eight) hours.   insulin aspart protamine- aspart (70-30) 100 UNIT/ML injection Commonly known as:  NOVOLOG MIX 70/30 Inject 0.3 mLs (30 Units total) into the skin 2 (two) times daily with a meal.   isosorbide mononitrate 30 MG 24 hr tablet Commonly known as:  IMDUR Take 1 tablet (30 mg total) by mouth daily.   mometasone-formoterol 200-5 MCG/ACT  Aero Commonly known as:  DULERA Inhale 2 puffs into the lungs 2 (two) times daily.   nitroGLYCERIN 0.4 MG/SPRAY spray Commonly known as:  NITROLINGUAL Place 1 spray under the tongue every 5 (five) minutes x 3 doses as needed for chest pain.   oxyCODONE-acetaminophen 5-325 MG tablet Commonly known as:  PERCOCET/ROXICET Take 1 tablet by mouth every 4 (four) hours as needed for moderate pain.   predniSONE 10 MG tablet Commonly known as:  DELTASONE Label  & dispense according to the schedule below. 5 Pills PO for 1 day then, 4 Pills PO for 1 day, 3 Pills PO for 1 day, 2 Pills PO for 1 day, 1 Pill PO for 1 days then STOP.   rosuvastatin 20 MG tablet Commonly known as:  CRESTOR Take 1 tablet (20 mg total) by mouth daily.   ticagrelor 60 MG Tabs tablet Commonly known as:  BRILINTA Take 1 tablet (60 mg total) by mouth 2 (two) times daily.         DISCHARGE INSTRUCTIONS:   DIET:  Cardiac diet and Diabetic diet  DISCHARGE CONDITION:  Stable  ACTIVITY:  Activity as tolerated  OXYGEN:  Home Oxygen: No.   Oxygen Delivery: room air  DISCHARGE LOCATION:  nursing home   If you experience worsening of your admission symptoms, develop shortness of breath, life threatening emergency, suicidal or homicidal thoughts you must seek medical attention immediately by calling 911 or calling your MD immediately  if symptoms less severe.  You Must read complete instructions/literature along with all the possible adverse reactions/side effects for all the Medicines you take and that have been prescribed to you. Take any new Medicines after you have completely understood and accpet all the possible adverse reactions/side effects.   Please note  You were cared for by a hospitalist during your hospital stay. If you have any questions about your discharge medications or the care you received while you were in the hospital after you are discharged, you can call the unit and asked to speak with the  hospitalist on call if the hospitalist that took care of you is not available. Once you are discharged, your primary care physician will handle any further medical issues. Please note that NO REFILLS for any discharge medications will be authorized once you are discharged, as it is imperative that you return to your primary care physician (or establish a relationship with a primary care physician if you do not have one) for your aftercare needs so that   they can reassess your need for medications and monitor your lab values.     Today   Complaining of some vague chest and back pain and some cough.  She is not hypoxic her lungs are clear.  She complains of some hemoptysis but not witnessed by nursing staff.  Will discharge to skilled nursing facility for ongoing care given her significant deconditioning.  VITAL SIGNS:  Blood pressure (!) 165/70, pulse 80, temperature (!) 97.5 F (36.4 C), temperature source Oral, resp. rate 18, height 5' 7" (1.702 m), weight 80.8 kg, SpO2 98 %.  I/O:    Intake/Output Summary (Last 24 hours) at 04/30/2018 0933 Last data filed at 04/30/2018 0749 Gross per 24 hour  Intake -  Output 1750 ml  Net -1750 ml    PHYSICAL EXAMINATION:   GENERAL:  77 y.o.-year-old patient lying in bed with a flat affect but in NAD.   EYES: Pupils equal, round, reactive to light and accommodation. No scleral icterus. Extraocular muscles intact.  HEENT: Head atraumatic, normocephalic. Oropharynx and nasopharynx clear.  NECK:  Supple, no jugular venous distention. No thyroid enlargement, no tenderness.  LUNGS: Good a/e B/l, no wheezing, rales, rhonchi. No use of accessory muscles of respiration.  CARDIOVASCULAR: S1, S2 normal. No murmurs, rubs, or gallops.  ABDOMEN: Soft, nontender, nondistended. Bowel sounds present. No organomegaly or mass.  EXTREMITIES: No cyanosis, clubbing or edema b/l.    NEUROLOGIC: Cranial nerves II through XII are intact. No focal Motor or sensory deficits  b/l. Globally weak and baseline essential Tremor PSYCHIATRIC: The patient is alert and oriented x 3.  SKIN: No obvious rash, lesion, or ulcer.   DATA REVIEW:   CBC Recent Labs  Lab 04/29/18 0342  WBC 12.3*  HGB 10.8*  HCT 34.1*  PLT 390    Chemistries  Recent Labs  Lab 04/25/18 0130  04/29/18 0342  NA 138   < > 140  K 3.2*   < > 3.9  CL 106   < > 104  CO2 24   < > 28  GLUCOSE 180*   < > 145*  BUN 15   < > 45*  CREATININE 1.18*   < > 1.35*  CALCIUM 9.9   < > 10.7*  AST 22  --   --   ALT 15  --   --   ALKPHOS 61  --   --   BILITOT 0.6  --   --    < > = values in this interval not displayed.    Cardiac Enzymes Recent Labs  Lab 04/25/18 1820  TROPONINI 0.03*     RADIOLOGY:  No results found.    Management plans discussed with the patient, family and they are in agreement.  CODE STATUS:     Code Status Orders  (From admission, onward)         Start     Ordered   04/25/18 0647  Full code  Continuous     04/25/18 0646         TOTAL TIME TAKING CARE OF THIS PATIENT: 40 minutes.    Henreitta Leber M.D on 04/30/2018 at 9:33 AM  Between 7am to 6pm - Pager - [phone]  After 6pm go to [email or web address] - Proofreader  [facility]  Office  [phone]  CC: Primary care physician; [name], MD

## 2018-04-30 NOTE — Progress Notes (Signed)
Pt's BP continues to be elevated after receiving one dose of IV hydralazine, Dr. Verdell Carmine notified and received verbal orders for extra dose of PO hydralazine 50 mg, Will give and continue to monitor.

## 2018-04-30 NOTE — Progress Notes (Signed)
Pt to d/c to [facility] today, Report given to [place] LPN at [facility] called for transportation.

## 2018-04-30 NOTE — Clinical Social Work Note (Signed)
Patient to be d/c'ed today to [facility], room [phone].  Patient and family agreeable to plans will transport via ems RN to call report to [place] [phone].  CSW notified patient's husband Pamela Collier, [phone].  Evette Cristal, MSW, [place]

## 2018-05-02 IMAGING — CR DG CHEST 2V
1 series · 2 of 2 positions shown · non-contrast
Comparison: January 02, 2016

CLINICAL DATA: Shortness of Breath

EXAM:
CHEST  2 VIEW

[Series 1: w chest pa · 0.14mm/px · 2 of 2 slices shown]
[im 1/2]
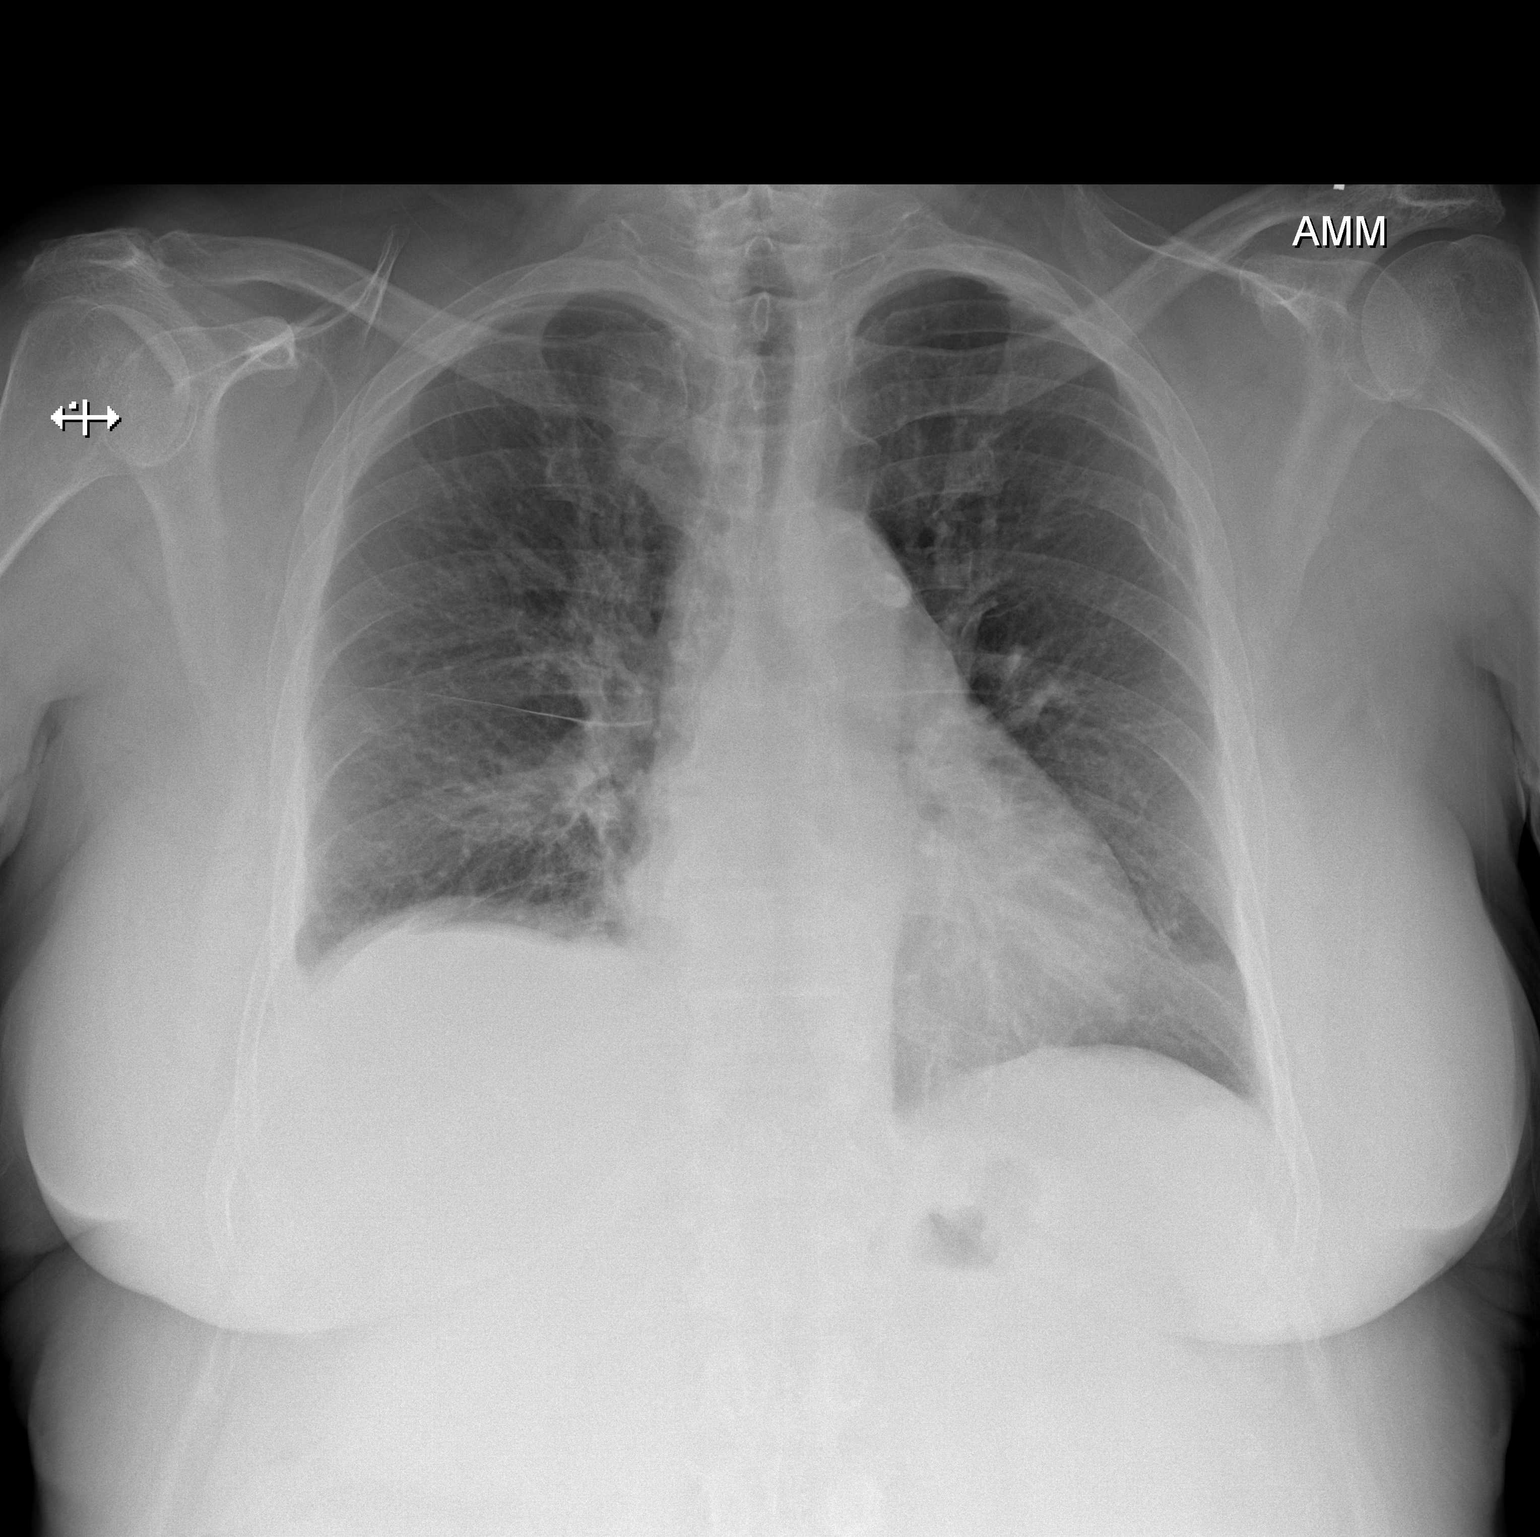
[im 2/2]
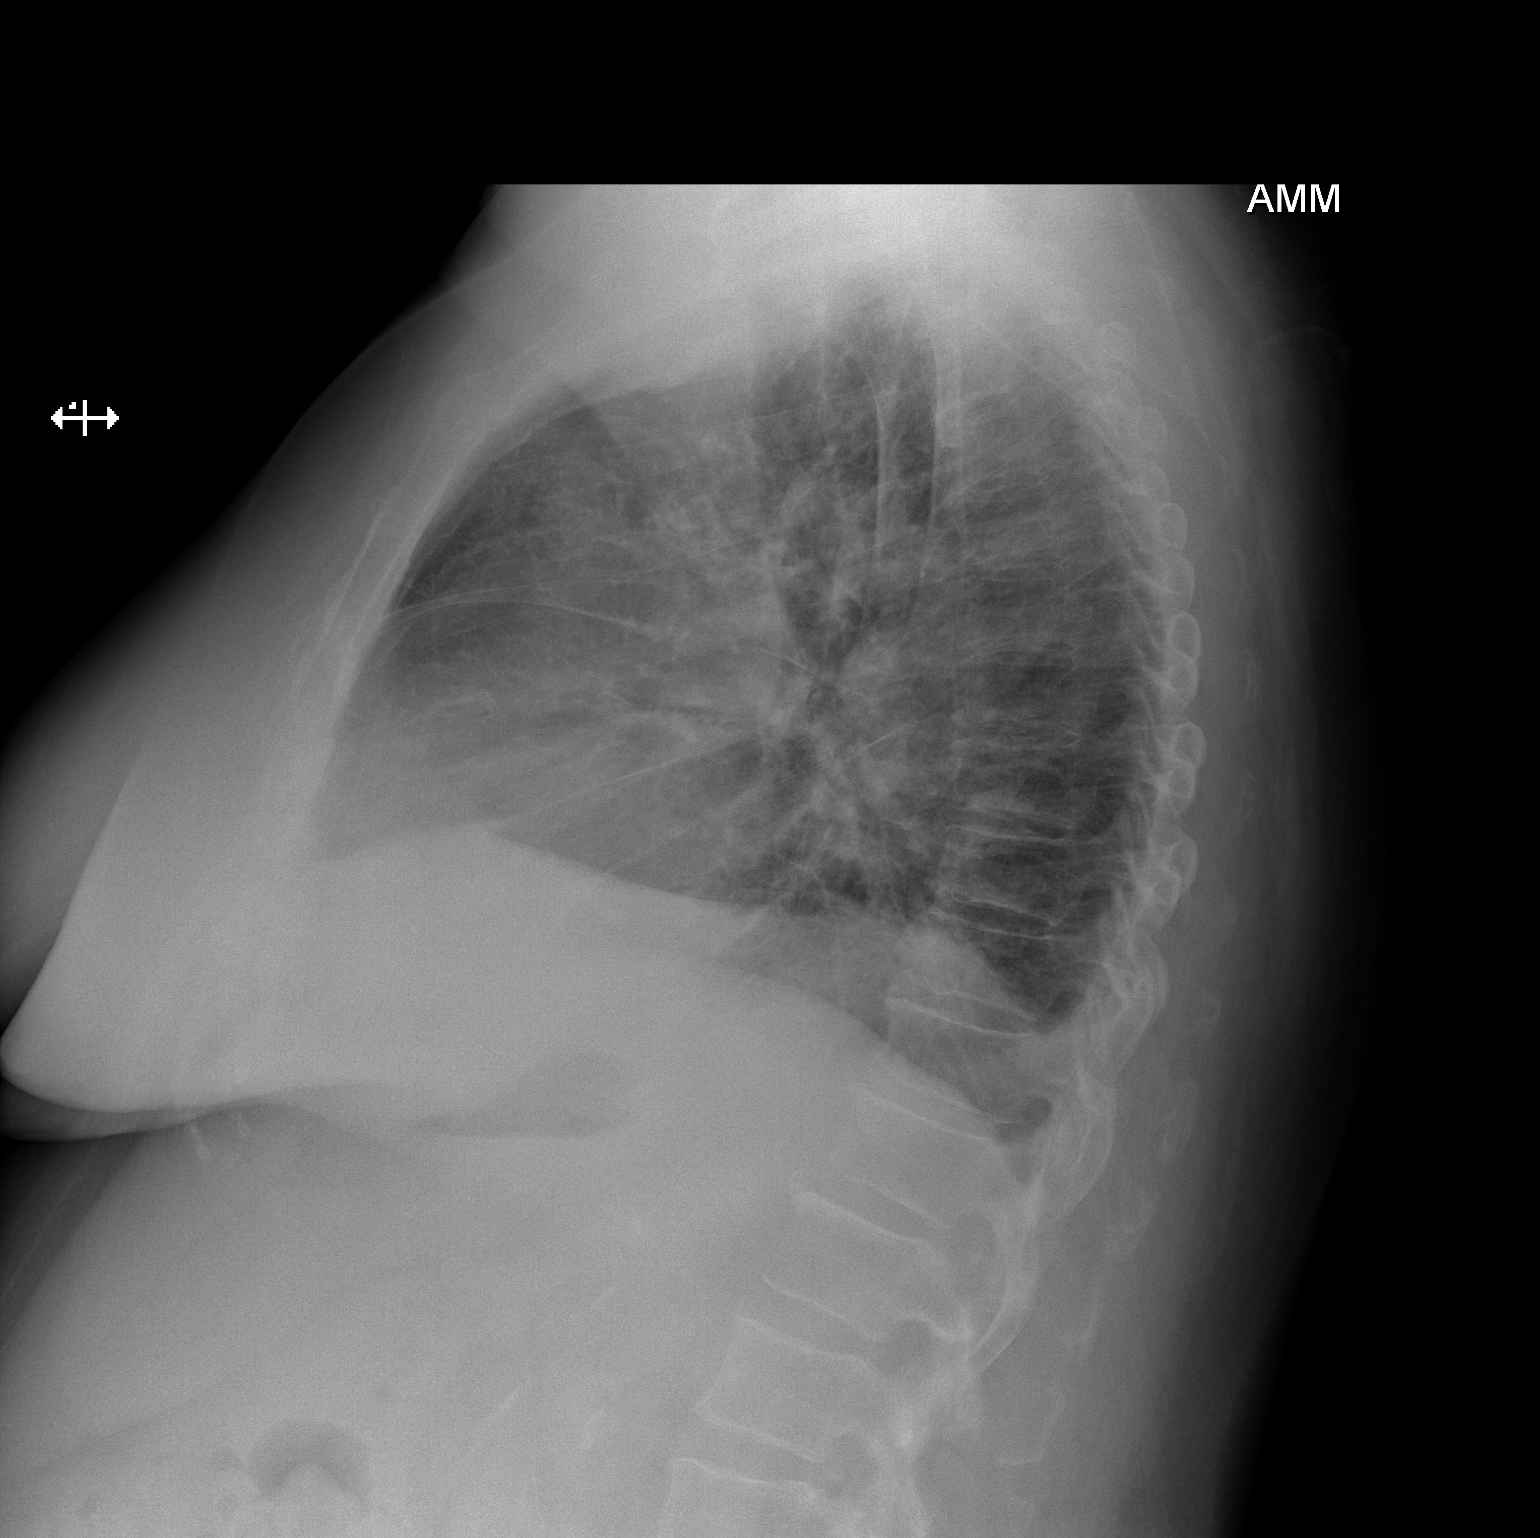

[2 of 2 positions shown; findings below may reference images not displayed]

FINDINGS: There is focal atelectatic change in the right lower lobe. There is
no frank edema or consolidation. Heart size is upper normal.
Pulmonary vascularity shows evidence of a degree of pulmonary venous
hypertension. No adenopathy. There is atherosclerotic calcification
in the aortic arch. There is an old healed fracture of the left
lateral fifth rib.
IMPRESSION: Focal atelectasis right lower lobe. No frank edema or consolidation.
There is evidence of pulmonary venous hypertension with mild
pulmonary vascular congestion. There is aortic atherosclerosis.

## 2018-05-03 ENCOUNTER — Other Ambulatory Visit: Payer: Self-pay | Admitting: *Deleted

## 2018-05-03 NOTE — Patient Outreach (Signed)
[place] [facility]) Care Management  05/03/2018  Pamela Collier 03/20/1941 [id number]   Care Coordination   Noted patient readmission at [facility] on  12/15-12/20, patient discharged to SNF at [facility].   Plan  Will notify [place] Coordinator.    [name], RN, [place] Management Coordinator  [phone]- Mobile [phone]- Toll Free Main Office

## 2018-05-04 ENCOUNTER — Telehealth: Payer: Self-pay

## 2018-05-04 DIAGNOSIS — E1149 Type 2 diabetes mellitus with other diabetic neurological complication: Secondary | ICD-10-CM | POA: Diagnosis not present

## 2018-05-04 DIAGNOSIS — J441 Chronic obstructive pulmonary disease with (acute) exacerbation: Secondary | ICD-10-CM | POA: Diagnosis not present

## 2018-05-04 DIAGNOSIS — I10 Essential (primary) hypertension: Secondary | ICD-10-CM | POA: Diagnosis not present

## 2018-05-04 DIAGNOSIS — I2510 Atherosclerotic heart disease of native coronary artery without angina pectoris: Secondary | ICD-10-CM | POA: Diagnosis not present

## 2018-05-04 NOTE — Telephone Encounter (Signed)
Attempt to follow up with transitional care management.  Noted patient readmission at [facility] on  12/15-12/20. Patient discharged to SNF at [facility].  Previously scheduled HFU with PCP on 12/27.  Plan  Will follow as appropriate once discharged home; coordinate OV date to reflect.   LPN Certified Health Coach

## 2018-05-06 DIAGNOSIS — D72828 Other elevated white blood cell count: Secondary | ICD-10-CM | POA: Diagnosis not present

## 2018-05-06 DIAGNOSIS — R5381 Other malaise: Secondary | ICD-10-CM | POA: Diagnosis not present

## 2018-05-06 DIAGNOSIS — J441 Chronic obstructive pulmonary disease with (acute) exacerbation: Secondary | ICD-10-CM | POA: Diagnosis not present

## 2018-05-06 DIAGNOSIS — I5031 Acute diastolic (congestive) heart failure: Secondary | ICD-10-CM | POA: Diagnosis not present

## 2018-05-07 ENCOUNTER — Inpatient Hospital Stay: Payer: Medicare Other | Admitting: Internal Medicine

## 2018-05-07 NOTE — Addendum Note (Signed)
Addended by: [name] A on: 05/07/2018 03:34 PM   Modules accepted: Orders

## 2018-05-08 ENCOUNTER — Other Ambulatory Visit
Admission: RE | Admit: 2018-05-08 | Discharge: 2018-05-08 | Disposition: A | Discharge status: Home / Self Care | Payer: No Typology Code available for payment source | Attending: Family Medicine | Admitting: Family Medicine

## 2018-05-08 DIAGNOSIS — R4182 Altered mental status, unspecified: Secondary | ICD-10-CM | POA: Insufficient documentation

## 2018-05-08 LAB — URINALYSIS, COMPLETE (UACMP) WITH MICROSCOPIC
Bacteria, UA: NONE SEEN
Bilirubin Urine: NEGATIVE
Glucose, UA: 150 mg/dL — AB
Hgb urine dipstick: NEGATIVE
Ketones, ur: NEGATIVE mg/dL
Leukocytes, UA: NEGATIVE
Nitrite: NEGATIVE
Protein, ur: 100 mg/dL — AB
Specific Gravity, Urine: 1.014 (ref 1.005–1.030)
pH: 5 (ref 5.0–8.0)

## 2018-05-10 LAB — URINE CULTURE: Culture: NO GROWTH

## 2018-05-11 ENCOUNTER — Other Ambulatory Visit: Payer: Self-pay | Admitting: Licensed Clinical Social Worker

## 2018-05-11 DIAGNOSIS — L84 Corns and callosities: Secondary | ICD-10-CM | POA: Diagnosis not present

## 2018-05-11 DIAGNOSIS — H538 Other visual disturbances: Secondary | ICD-10-CM | POA: Diagnosis not present

## 2018-05-11 DIAGNOSIS — K5909 Other constipation: Secondary | ICD-10-CM | POA: Diagnosis not present

## 2018-05-11 DIAGNOSIS — R198 Other specified symptoms and signs involving the digestive system and abdomen: Secondary | ICD-10-CM | POA: Diagnosis not present

## 2018-05-11 NOTE — Patient Outreach (Signed)
[place] [facility]) Care Management  05/11/2018  YENTL VERGE 1940-08-14 [id number]  [facility] CSW is currently providing coverage for [place]. [facility] CSW completed call to [facility] social worker Emmie Niemann and left a voice message encouraging a return call in order to gain updates and discharge status on patient. [facility] CSW will await for return call and will follow up with [facility] CSW Chrystal Land upon her return back to the office.   Eula Fried, BSW, MSW, [email or web address] Phone: [phone] Fax: [phone]

## 2018-05-13 ENCOUNTER — Ambulatory Visit: Payer: Medicare Other | Admitting: Family

## 2018-05-14 ENCOUNTER — Other Ambulatory Visit: Payer: Self-pay | Admitting: Licensed Clinical Social Worker

## 2018-05-14 NOTE — Patient Outreach (Signed)
[place] [facility]) Care Management  05/14/2018  Pamela Collier [birth year] [id number]  [facility] CSW received incoming return call from [facility] social worker Everette Rank. She reports that she has not established contact yet with family but that a care team planning meeting has been scheduled for 05/17/17 with patient and spouse. [facility] CSW will update [place] who will follow up with patient upon her return back to the office.  Eula Fried, BSW, MSW, [email or web address] Phone: [phone] Fax: [phone]

## 2018-05-15 NOTE — Progress Notes (Signed)
Cardiology Office Note Date:  05/17/2018  Patient ID:  Arnie, Maiolo [birth year], MRN [id number] PCP:  [name], MD  Cardiologist:  Dr. Rockey Situ, MD    Chief Complaint: Hospital follow up  History of Present Illness: Pamela Collier is a 78 y.o. 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  Patient is status post multiple PCI's to the LAD, LCx, and RCA with catheterizations performed in 2010, 2011, 2013, and 2016 in [place].  We do not have details of her most recent cardiac catheterization from 2016.  Most recent cath in which we have details is from 05/2011 which showed 40% mid LAD stenosis, 50% distal LAD stenosis, small diagonal with 70% stenosis, 50% proximal LCx stenosis, 75% distal LCx stenosis with patent stent, small RCA with calcified, severe 90% distal RCA stenosis.  EF was 35 to 40%.  Most recent ischemic evaluation via stress test in 11/2016 which showed no significant ischemia with an EF of 43%.  Echocardiogram at that time showed an EF of 55 to 60%, unable to exclude regional wall motion abnormalities, grade 1 diastolic dysfunction, mildly dilated left atrium, RV systolic function normal, PASP normal.  Echo from 05/24/2017 showed an EF of 60 to 65%, no regional wall motion abnormalities, grade 2 diastolic dysfunction, calcified mitral annulus, left atrium normal in size, RVSF normal, PASP normal.  Patient was seen in the ER twice and 03/2018 for chest pain and near syncope with cardiac enzymes being negative.  Her near syncope/dizziness was felt to be aggravated by UTI and chronic diarrhea.  She was  admitted to the hospital on 04/25/2018 with acute on chronic diastolic CHF, COPD exacerbation, and atypical chest pain.  Troponin peaked at 0.03 and was flat trending.  BNP was minimally elevated at 282.  Chest x-ray showed cardiomegaly and interstitial prominence suggestive of possible pulmonary edema, small right pleural effusion.  Echo showed an EF of 60 to 65%, moderate LVH, no regional wall motion abnormalities, grade 2 diastolic dysfunction, calcified mitral annulus, mildly dilated left atrium, RV cavity size was normal with normal RV systolic function, trivial pericardial effusion was identified posterior to the heart.  With diuresis she required Lasix being held for several days secondary to AKI.  Some of her shortness of breath was felt to be secondary to deconditioning as well.  Discharge labs: Serum creatinine 1.35, potassium 3.9, WBC 12.3, Hgb 10.80  Discharge weight: 80.8 kg  Discharge medications: Medication List    TAKE these medications   acetaminophen 500 MG tablet Commonly known as:  TYLENOL Take 1,000 mg by mouth every 6 (six) hours as needed for mild pain or headache.   allopurinol 100 MG tablet Commonly known as:  ZYLOPRIM Take 1 tablet (100 mg total) by mouth daily.   ALPRAZolam 0.25 MG tablet Commonly known as:  XANAX Take 1 tablet (0.25 mg total) by mouth daily.   amLODipine 5 MG tablet Commonly known as:  NORVASC Take 2 tablets (10 mg total) by mouth daily. What changed:  how much to take   aspirin EC 81 MG tablet Take 81 mg by mouth daily.   B-D UF III MINI PEN NEEDLES 31G X 5  MM Misc Generic drug:  Insulin Pen Needle USE THREE TIMES A DAY   blood glucose meter kit and supplies Dispense based on patient and insurance preference. Use up to three times daily as directed. (FOR ICD-10 E11.21)   dicyclomine 10 MG capsule Commonly known as:  BENTYL Take 1 capsule (10 mg total) by mouth 4 (four) times daily -  before meals and at bedtime.     diphenoxylate-atropine 2.5-0.025 MG tablet Commonly known as:  LOMOTIL Take 1 tablet by mouth 4 (four) times daily as needed for diarrhea or loose stools.   feeding supplement (GLUCERNA SHAKE) Liqd Take 237 mLs by mouth 2 (two) times daily between meals.   fluticasone 50 MCG/ACT nasal spray Commonly known as:  FLONASE Place 2 sprays into both nostrils daily as needed for rhinitis.   gabapentin 100 MG capsule Commonly known as:  NEURONTIN Take 1 capsule (100 mg total) by mouth 3 (three) times daily.   glucose blood test strip Use to check blood sugars 2 times daily   glucose blood test strip Commonly known as:  ACCU-CHEK AVIVA PLUS USE TO CHECK GLUCOSE THREE TIMES DAILY   guaiFENesin-dextromethorphan 100-10 MG/5ML syrup Commonly known as:  ROBITUSSIN DM Take 5 mLs by mouth every 4 (four) hours as needed for cough.   hydrALAZINE 100 MG tablet Commonly known as:  APRESOLINE Take 1 tablet (100 mg total) by mouth every 8 (eight) hours.   insulin aspart protamine- aspart (70-30) 100 UNIT/ML injection Commonly known as:  NOVOLOG MIX 70/30 Inject 0.3 mLs (30 Units total) into the skin 2 (two) times daily with a meal.   isosorbide mononitrate 30 MG 24 hr tablet Commonly known as:  IMDUR Take 1 tablet (30 mg total) by mouth daily.   mometasone-formoterol 200-5 MCG/ACT Aero Commonly known as:  DULERA Inhale 2 puffs into the lungs 2 (two) times daily.   nitroGLYCERIN 0.4 MG/SPRAY spray Commonly known as:  NITROLINGUAL Place 1 spray under the tongue every 5 (five) minutes x 3 doses as needed for chest pain.   oxyCODONE-acetaminophen 5-325 MG tablet Commonly known as:  PERCOCET/ROXICET Take 1 tablet by mouth every 4 (four) hours as needed for moderate pain.   predniSONE 10 MG tablet Commonly known as:  DELTASONE Label  & dispense according to the schedule below. 5 Pills PO for 1 day then, 4 Pills PO for 1 day, 3 Pills PO for 1 day, 2 Pills PO for 1 day, 1 Pill PO  for 1 days then STOP.   rosuvastatin 20 MG tablet Commonly known as:  CRESTOR Take 1 tablet (20 mg total) by mouth daily.   ticagrelor 60 MG Tabs tablet Commonly known as:  BRILINTA Take 1 tablet (60 mg total) by mouth 2 (two) times daily.    She comes in doing well today. Her SOB has significantly improved. No further chest pain. Minimal ankle edema. She denies any orthopnea, abdominal distension, PND, or early satiety. BP has been well controlled at [facility] since her discharge. Weight has been stable. She does not have any issues or concerns today.   Past Medical History:  Diagnosis Date  . Acute respiratory failure ([place]) 11/21/2014  . Anxiety   . Arthritis 08/05/2013  . Atherosclerotic peripheral vascular disease with gangrene [facility]) august 2012  . Cardiomyopathy, ischemic    a. EF 35 to 40% by echo in 2013 b. EF improved to 50-55% by echo in 04/2015; 11/2016 Echo: EF 55-60%, Gr1 DD, mildly dil LA, nl RV fxn.  [name]  Cerebral infarct ([place]) 08/17/2013  . Chronic diastolic CHF (congestive heart failure) (HCC)    a. EF 50-55% by echo in 04/2015; b. 11/2016 Echo: EF 55-60%, Gr1 DD.  [name] COPD (chronic obstructive pulmonary disease) ([place])   . Coronary artery disease, occlusive    a. Previous PCI to the LAD, LCx, and RCA in 2010, 2011, 2013, and 2016. All performed in [place]; b. 11/2016 Lexiscan MV: EF 43%, no ischemia->low risk.  . 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 Diabetic diarrhea ([place]) 10/03/2014  . DM type 2, uncontrolled, with renal complications ([place]) 06/18/348  . Hepatic steatosis    by CT abd pelvis  . History of kidney stones    at a younger age  . Hyperlipidemia LDL goal <100 02/23/2014  . Hypertensive heart disease   . Osteoporosis, post-menopausal   . 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 Pill esophagitis   . Pleural effusion 10/25/2012   10/2012 CT chest >> small to moderate R lung effusion>>  chylothorax, 100% lymphs 10/2013 thoracentesis> cytology negative, WBC 1471, > 90% "small lymphs" 01/2014 CT chest> near complete resolution of pleural effusion, stable lymphadenopathy 08/2014 CT chest [place] ([facility]): small right sided effusion decreased in size, stable mediastinal lymphadenopathy 1.0cm largest, 64m . [place] 12/07/2012   [place] 03/2013 Full PFT [facility] > UNACCEPTABLE AND NOT REPRODUCIBLE DATA> Ratio 71% FEV 1 1.02 L (55% pred), FVC 1.31 L (49% pred) could not do lung volumes or DLCO   . Sleep apnea   . Stage III chronic kidney disease ([facility]     Past Surgical History:  Procedure Laterality Date  . ABDOMINAL HYSTERECTOMY     at ge 39. secondary to bleeding/partial  . ANTERIOR CERVICAL DECOMP/DISCECTOMY FUSION N/A 10/10/2016   Procedure: Anterior Cervical Discectomy Fusion - Cervical four4- five - Cervical five-Cervical six - Cervical six-Cervical seven;  Surgeon: PEarnie Larsson MD;  [place]  Service: Neurosurgery;  Laterality: N/A;  . BREAST CYST ASPIRATION Right   . CARPAL TUNNEL RELEASE Bilateral   . [place]     [place]  . COLONOSCOPY WITH PROPOFOL N/A 01/09/2015   Procedure: COLONOSCOPY WITH PROPOFOL;  Surgeon: DLucilla Lame MD;  Location: [facility] ENDOSCOPY;  Service: Endoscopy;  Laterality: N/A;  . CORONARY ANGIOPLASTY WITH STENT [place]ACEMENT     [place] [facility]  . EYE SURGERY     bil cataracts  . HERNIA REPAIR     umbilical/ Dr EPat Patrick . OOPHORECTOMY    . PTCA  August 2012   Right Posterior tibial artery , [place]  . TOE AMPUTATION  Sept 2012   Right 2nd toe, [place]    Current Meds  Medication Sig  . acetaminophen (TYLENOL) 500 MG tablet Take 1,000 mg by mouth every 6 (six) hours as needed for mild pain or headache.   . allopurinol (ZYLOPRIM) 100 MG tablet Take 1 tablet (100 mg total) by mouth daily.  .Marland KitchenALPRAZolam (XANAX)  0.25 MG tablet Take 1 tablet (0.25 mg total) by mouth daily.  .Marland KitchenamLODipine (NORVASC) 5 MG tablet Take 2 tablets (10 mg total) by mouth daily.  .Marland Kitchenaspirin EC 81 MG tablet Take 81 mg by mouth daily.  . B-D UF III MINI PEN NEEDLES 31G X 5 MM MISC USE THREE TIMES A DAY  .  blood glucose meter kit and supplies Dispense based on patient and insurance preference. Use up to three times daily as directed. (FOR ICD-10 E11.21)  . dicyclomine (BENTYL) 10 MG capsule Take 1 capsule (10 mg total) by mouth 4 (four) times daily -  before meals and at bedtime.  . diphenoxylate-atropine (LOMOTIL) 2.5-0.025 MG tablet Take 1 tablet by mouth 4 (four) times daily as needed for diarrhea or loose stools.  . donepezil (ARICEPT) 10 MG tablet Take 10 mg by mouth at bedtime.  . feeding supplement, GLUCERNA SHAKE, (GLUCERNA SHAKE) LIQD Take 237 mLs by mouth 2 (two) times daily between meals.  . fluticasone (FLONASE) 50 MCG/ACT nasal spray Place 2 sprays into both nostrils daily as needed for rhinitis.  [name] glucose blood (ACCU-CHEK AVIVA PLUS) test strip USE TO CHECK GLUCOSE THREE TIMES DAILY  . glucose blood test strip Use to check blood sugars 2 times daily  . guaiFENesin-dextromethorphan (ROBITUSSIN DM) 100-10 MG/5ML syrup Take 5 mLs by mouth every 4 (four) hours as needed for cough.  . hydrALAZINE (APRESOLINE) 100 MG tablet Take 1 tablet (100 mg total) by mouth every 8 (eight) hours.  . insulin aspart protamine- aspart (NOVOLOG MIX 70/30) (70-30) 100 UNIT/ML injection Inject 0.3 mLs (30 Units total) into the skin 2 (two) times daily with a meal.  . isosorbide mononitrate (IMDUR) 30 MG 24 hr tablet Take 1 tablet (30 mg total) by mouth daily.  . mometasone-formoterol (DULERA) 200-5 MCG/ACT AERO Inhale 2 puffs into the lungs 2 (two) times daily.  . nitroGLYCERIN (NITROLINGUAL) 0.4 MG/SPRAY spray Place 1 spray under the tongue every 5 (five) minutes x 3 doses as needed for chest pain.  [name] ondansetron (ZOFRAN) 4 MG tablet Take 4 mg by  mouth every 8 (eight) hours as needed for nausea or vomiting.  . predniSONE (DELTASONE) 10 MG tablet Take 10 mg by mouth daily with breakfast.  . pregabalin (LYRICA) 100 MG capsule Take 100 mg by mouth daily.  . propranolol (INDERAL) 10 MG tablet Take 10 mg by mouth daily.  . rosuvastatin (CRESTOR) 20 MG tablet Take 1 tablet (20 mg total) by mouth daily.  [name] senna (SENOKOT) 8.6 MG tablet Take 1 tablet by mouth 2 (two) times daily as needed for constipation.  . ticagrelor (BRILINTA) 60 MG TABS tablet Take 1 tablet (60 mg total) by mouth 2 (two) times daily.    Allergies:   Cephalexin; Contrast media [iodinated diagnostic agents]; Doxycycline; Gabapentin; and Sulfa antibiotics   Social History:  The patient  reports that she has never smoked. She has never used smokeless tobacco. She reports that she does not drink alcohol or use drugs.   Family History:  The patient's family history includes Asthma in her grandchild; Breast cancer in her maternal aunt; Heart attack in her brother, father, and sister; Heart disease in her brother, father, and sister; Tremor in her mother.  ROS:   Review of Systems  Constitutional: Positive for malaise/fatigue. Negative for chills, diaphoresis, fever and weight loss.  HENT: Negative for congestion.   Eyes: Negative for discharge and redness.  Respiratory: Negative for cough, hemoptysis, sputum production, shortness of breath and wheezing.   Cardiovascular: Positive for leg swelling. Negative for chest pain, palpitations, orthopnea, claudication and PND.  Gastrointestinal: Negative for abdominal pain, blood in stool, heartburn, melena, nausea and vomiting.  Genitourinary: Negative for hematuria.  Musculoskeletal: Negative for falls and myalgias.  Skin: Negative for rash.  Neurological: Positive for weakness. Negative for dizziness, tingling, tremors, sensory change, speech  change, focal weakness and loss of consciousness.  Endo/Heme/Allergies: Does not  bruise/bleed easily.  Psychiatric/Behavioral: Negative for substance abuse. The patient is not nervous/anxious.   All other systems reviewed and are negative.    PHYSICAL EXAM:  VS:  BP (!) 122/50 (BP Location: Right Arm, Patient Position: Sitting, Cuff Size: Large)   Pulse 66   Ht 5' 7"  (1.702 m)   Wt 181 lb (82.1 kg)   BMI 28.35 kg/m  BMI: Body mass index is 28.35 kg/m.  Physical Exam  Constitutional: She is oriented to person, place, and time. She appears well-developed and well-nourished.  HENT:  Head: Normocephalic and atraumatic.  Eyes: Right eye exhibits no discharge. Left eye exhibits no discharge.  Neck: Normal range of motion. No JVD present.  Cardiovascular: Normal rate, regular rhythm, S1 normal, S2 normal and normal heart sounds. Exam reveals no distant heart sounds, no friction rub, no midsystolic click and no opening snap.  No murmur heard. Pulses:      Posterior tibial pulses are 2+ on the right side and 2+ on the left side.  Pulmonary/Chest: Effort normal. No respiratory distress. She has decreased breath sounds. She has no wheezes. She has no rales. She exhibits no tenderness.  Diminished and coarse breath sounds bilaterally.   Abdominal: Soft. She exhibits no distension. There is no abdominal tenderness.  Musculoskeletal:        General: Edema present.     Comments: Trace bilateral ankle edema  Neurological: She is alert and oriented to person, place, and time.  Skin: Skin is warm and dry. No cyanosis. Nails show no clubbing.  Psychiatric: She has a normal mood and affect. Her speech is normal and behavior is normal. Judgment and thought content normal.     EKG:  Was ordered and interpreted by me today. Shows NSR, 66 bpm, left axis deviation, right bundle branch block unchanged from prior  Recent Labs: 04/25/2018: ALT 15; B Natriuretic Peptide 282.0; TSH 0.758 04/29/2018: BUN 45; Creatinine, Ser 1.35; Hemoglobin 10.8; Platelets 390; Potassium 3.9; Sodium 140   No results found for requested labs within last 8760 hours.   Estimated Creatinine Clearance: 37.8 mL/min (A) (by C-G formula based on SCr of 1.35 mg/dL (H)).   Wt Readings from Last 3 Encounters:  05/17/18 181 lb (82.1 kg)  04/30/18 178 lb 1.6 oz (80.8 kg)  04/23/18 187 lb (84.8 kg)     Other studies reviewed: Additional studies/records reviewed today include: summarized above  ASSESSMENT AND PLAN:  1. CAD involving the native coronary arteries without angina: She is doing well without any symptoms concerning for ischemia.  No recent stenting.  Remains on dual antiplatelet therapy with aspirin and Brilinta per primary cardiologist.  Hemoglobin stable at time of discharge on 04/29/2018.  Check CBC given dual antiplatelet therapy. No plans for ischemic evaluation at this time.   2. Chronic combined CHF secondary to ischemic cardiomyopathy with subsequent normalization of EF: She is well compensated and euvolemic today. Some of her lower extremity swelling is likely exacerbated by her amlodipine. However, her BP is well controlled on Norvasc with minimal ankle edema at this time. CHF education. Start Lasix 20 mg MWF as recommended during her admission. Check BMP today.   3. Hypertension: Blood pressure is well controlled. Continue Norvasc, hydralazine, and propranolol.   4. Hyperlipidemia: Most recent lipid panel from 11/2016 with an LDL of 60.  Liver function normal in 04/2018.  Remains on Crestor 20 mg daily.  5. CKD stage III:  Check BMP.  6. COPD/pulmonary sarcoidosis: Stable.  Followed by pulmonology.  7. OSA: CPAP.   Disposition: F/u with Dr. Rockey Situ or an APP in 3 months.   Current medicines are reviewed at length with the patient today.  The patient did not have any concerns regarding medicines.  Signed, Christell Faith, PA-C 05/17/2018 9:21 AM     [place] [address] [place], [place] [address] [phone]

## 2018-05-17 ENCOUNTER — Ambulatory Visit (INDEPENDENT_AMBULATORY_CARE_PROVIDER_SITE_OTHER): Payer: Medicare Other | Admitting: Physician Assistant

## 2018-05-17 ENCOUNTER — Encounter: Payer: Self-pay | Admitting: Physician Assistant

## 2018-05-17 VITALS — BP 122/50 | HR 66 | Ht 67.0 in | Wt 181.0 lb

## 2018-05-17 DIAGNOSIS — I2510 Atherosclerotic heart disease of native coronary artery without angina pectoris: Secondary | ICD-10-CM

## 2018-05-17 DIAGNOSIS — N183 Chronic kidney disease, stage 3 unspecified: Secondary | ICD-10-CM

## 2018-05-17 DIAGNOSIS — D860 Sarcoidosis of lung: Secondary | ICD-10-CM

## 2018-05-17 DIAGNOSIS — E782 Mixed hyperlipidemia: Secondary | ICD-10-CM

## 2018-05-17 DIAGNOSIS — J449 Chronic obstructive pulmonary disease, unspecified: Secondary | ICD-10-CM

## 2018-05-17 DIAGNOSIS — I5032 Chronic diastolic (congestive) heart failure: Secondary | ICD-10-CM

## 2018-05-17 DIAGNOSIS — I10 Essential (primary) hypertension: Secondary | ICD-10-CM | POA: Diagnosis not present

## 2018-05-17 MED ORDER — FUROSEMIDE 20 MG PO TABS: ORAL_TABLET | ORAL | 3 refills | Status: DC

## 2018-05-17 NOTE — Patient Instructions (Signed)
Medication Instructions:  Your physician has recommended you make the following change in your medication:  1- Furosemide Take 1 tablet (20 mg ) once daily on Monday, Wednesday and Friday If you need a refill on your cardiac medications before your next appointment, please call your pharmacy.   Lab work: Your physician recommends that you return for lab work today (BMET, CBC) If you have labs (blood work) drawn today and your tests are completely normal, you will receive your results only by: [name] MyChart Message (if you have MyChart) OR . A paper copy in the mail If you have any lab test that is abnormal or we need to change your treatment, we will call you to review the results.  Testing/Procedures: None ordered   Follow-Up: At [facility], you and your health needs are our priority.  As part of our continuing mission to provide you with exceptional heart care, we have created designated Provider Care Teams.  These Care Teams include your primary Cardiologist (physician) and Advanced Practice Providers (APPs -  Physician Assistants and Nurse Practitioners) who all work together to provide you with the care you need, when you need it. You will need a follow up appointment in 3 months.  Please call our office 2 months in advance to schedule this appointment.  You may see Ida Rogue, MD or one of the following Advanced Practice Providers on your designated Care Team:   Murray Hodgkins, NP Christell Faith, PA-C . Marrianne Mood, PA-C

## 2018-05-18 ENCOUNTER — Telehealth: Payer: Self-pay

## 2018-05-18 ENCOUNTER — Other Ambulatory Visit: Payer: Self-pay | Admitting: *Deleted

## 2018-05-18 DIAGNOSIS — I5032 Chronic diastolic (congestive) heart failure: Secondary | ICD-10-CM | POA: Diagnosis not present

## 2018-05-18 DIAGNOSIS — J398 Other specified diseases of upper respiratory tract: Secondary | ICD-10-CM | POA: Diagnosis not present

## 2018-05-18 LAB — CBC
Hematocrit: 33.1 % — ABNORMAL LOW (ref 34.0–46.6)
Hemoglobin: 10.8 g/dL — ABNORMAL LOW (ref 11.1–15.9)
MCH: 28.8 pg (ref 26.6–33.0)
MCHC: 32.6 g/dL (ref 31.5–35.7)
MCV: 88 fL (ref 79–97)
Platelets: 227 10*3/uL (ref 150–450)
RBC: 3.75 x10E6/uL — ABNORMAL LOW (ref 3.77–5.28)
RDW: 15.8 % — ABNORMAL HIGH (ref 11.7–15.4)
WBC: 12.3 10*3/uL — AB (ref 3.4–10.8)

## 2018-05-18 LAB — BASIC METABOLIC PANEL
BUN/Creatinine Ratio: 13 (ref 12–28)
BUN: 21 mg/dL (ref 8–27)
CHLORIDE: 101 mmol/L (ref 96–106)
CO2: 23 mmol/L (ref 20–29)
Calcium: 9 mg/dL (ref 8.7–10.3)
Creatinine, Ser: 1.67 mg/dL — ABNORMAL HIGH (ref 0.57–1.00)
GFR calc Af Amer: 34 mL/min/{1.73_m2} — ABNORMAL LOW (ref >59)
GFR calc non Af Amer: 29 mL/min/{1.73_m2} — ABNORMAL LOW (ref >59)
Glucose: 111 mg/dL — ABNORMAL HIGH (ref 65–99)
Potassium: 3.6 mmol/L (ref 3.5–5.2)
Sodium: 138 mmol/L (ref 134–144)

## 2018-05-18 NOTE — Telephone Encounter (Signed)
-----   Message from Rise Mu, PA-C sent at 05/18/2018  7:45 AM EST ----- Renal function trended up slightly though not far off from her baseline.  Recommend she increase water intake slightly.  Hemoglobin low though stable.  Persistent leukocytosis likely in the setting of chronic steroid use.

## 2018-05-18 NOTE — Telephone Encounter (Signed)
Spoke to [place], okay per DPR. Called to discuss lab values and recommendations from provider.  He verbalized understanding and agreeable to POC.  Advised pt to call for any further questions or concerns

## 2018-05-18 NOTE — Patient Outreach (Addendum)
[facility]) Care Management  05/18/2018  Pamela Collier [birth year] [id number]   Post Acute Care Consult visit with patient at [facility]. This Education officer, museum  met with patient in her room. Patient found wearing a mask due to illness. Per patient, she has had a cold since she entered the facility and hs been prescribed cough syrup. Despite her illness, she continues to participate in therapy in hopes tor eturn home soon. Per patient, she did have a care plan meeting yesterday and was informed of her discharge date scheduled for 05/26/18 with Home Health.  Patient will return home with her husband.   Plan: This social worker will inform [place] of anticipated discharge date.   [name] [facility] Care Management [phone]

## 2018-05-24 ENCOUNTER — Other Ambulatory Visit: Payer: Self-pay | Admitting: *Deleted

## 2018-05-24 NOTE — Patient Outreach (Signed)
[facility] [facility]) Care Management  05/24/2018  MIOSHA BEHE December 11, 1940 [id number]   Post Acute Care Coordination Call   Phone call to discharge planner, Emmie Niemann for an update on patient's progress in rehab. Per Levada Dy, patient will discharge home on Wednesday, 05/26/18 with [place] [facility]. Patient will discharge with no DME.   [name] [facility] Care Management [phone]

## 2018-05-25 ENCOUNTER — Encounter: Payer: Self-pay | Admitting: Family

## 2018-05-25 ENCOUNTER — Ambulatory Visit: Payer: No Typology Code available for payment source | Attending: Family | Admitting: Family

## 2018-05-25 ENCOUNTER — Ambulatory Visit (INDEPENDENT_AMBULATORY_CARE_PROVIDER_SITE_OTHER): Payer: Medicare Other

## 2018-05-25 ENCOUNTER — Telehealth: Payer: Self-pay | Admitting: *Deleted

## 2018-05-25 VITALS — BP 138/45 | HR 62 | Resp 18 | Ht 67.0 in | Wt 182.0 lb

## 2018-05-25 DIAGNOSIS — Z89421 Acquired absence of other right toe(s): Secondary | ICD-10-CM | POA: Insufficient documentation

## 2018-05-25 DIAGNOSIS — J449 Chronic obstructive pulmonary disease, unspecified: Secondary | ICD-10-CM | POA: Insufficient documentation

## 2018-05-25 DIAGNOSIS — E1122 Type 2 diabetes mellitus with diabetic chronic kidney disease: Secondary | ICD-10-CM | POA: Insufficient documentation

## 2018-05-25 DIAGNOSIS — Z794 Long term (current) use of insulin: Secondary | ICD-10-CM | POA: Insufficient documentation

## 2018-05-25 DIAGNOSIS — I890 Lymphedema, not elsewhere classified: Secondary | ICD-10-CM | POA: Diagnosis not present

## 2018-05-25 DIAGNOSIS — I2510 Atherosclerotic heart disease of native coronary artery without angina pectoris: Secondary | ICD-10-CM | POA: Diagnosis not present

## 2018-05-25 DIAGNOSIS — J441 Chronic obstructive pulmonary disease with (acute) exacerbation: Secondary | ICD-10-CM | POA: Diagnosis not present

## 2018-05-25 DIAGNOSIS — I6523 Occlusion and stenosis of bilateral carotid arteries: Secondary | ICD-10-CM

## 2018-05-25 DIAGNOSIS — G4733 Obstructive sleep apnea (adult) (pediatric): Secondary | ICD-10-CM | POA: Insufficient documentation

## 2018-05-25 DIAGNOSIS — E1121 Type 2 diabetes mellitus with diabetic nephropathy: Secondary | ICD-10-CM

## 2018-05-25 DIAGNOSIS — Z8673 Personal history of transient ischemic attack (TIA), and cerebral infarction without residual deficits: Secondary | ICD-10-CM | POA: Insufficient documentation

## 2018-05-25 DIAGNOSIS — Z9049 Acquired absence of other specified parts of digestive tract: Secondary | ICD-10-CM | POA: Insufficient documentation

## 2018-05-25 DIAGNOSIS — Z7952 Long term (current) use of systemic steroids: Secondary | ICD-10-CM | POA: Insufficient documentation

## 2018-05-25 DIAGNOSIS — M1990 Unspecified osteoarthritis, unspecified site: Secondary | ICD-10-CM | POA: Insufficient documentation

## 2018-05-25 DIAGNOSIS — E1151 Type 2 diabetes mellitus with diabetic peripheral angiopathy without gangrene: Secondary | ICD-10-CM | POA: Insufficient documentation

## 2018-05-25 DIAGNOSIS — I130 Hypertensive heart and chronic kidney disease with heart failure and stage 1 through stage 4 chronic kidney disease, or unspecified chronic kidney disease: Secondary | ICD-10-CM | POA: Insufficient documentation

## 2018-05-25 DIAGNOSIS — G4730 Sleep apnea, unspecified: Secondary | ICD-10-CM | POA: Insufficient documentation

## 2018-05-25 DIAGNOSIS — I255 Ischemic cardiomyopathy: Secondary | ICD-10-CM | POA: Insufficient documentation

## 2018-05-25 DIAGNOSIS — N183 Chronic kidney disease, stage 3 (moderate): Secondary | ICD-10-CM | POA: Diagnosis not present

## 2018-05-25 DIAGNOSIS — Z91041 Radiographic dye allergy status: Secondary | ICD-10-CM | POA: Insufficient documentation

## 2018-05-25 DIAGNOSIS — Z8249 Family history of ischemic heart disease and other diseases of the circulatory system: Secondary | ICD-10-CM | POA: Insufficient documentation

## 2018-05-25 DIAGNOSIS — E1140 Type 2 diabetes mellitus with diabetic neuropathy, unspecified: Secondary | ICD-10-CM | POA: Diagnosis not present

## 2018-05-25 DIAGNOSIS — Z881 Allergy status to other antibiotic agents status: Secondary | ICD-10-CM | POA: Insufficient documentation

## 2018-05-25 DIAGNOSIS — I5032 Chronic diastolic (congestive) heart failure: Secondary | ICD-10-CM | POA: Insufficient documentation

## 2018-05-25 DIAGNOSIS — Z79899 Other long term (current) drug therapy: Secondary | ICD-10-CM | POA: Insufficient documentation

## 2018-05-25 DIAGNOSIS — Z888 Allergy status to other drugs, medicaments and biological substances status: Secondary | ICD-10-CM | POA: Insufficient documentation

## 2018-05-25 DIAGNOSIS — E785 Hyperlipidemia, unspecified: Secondary | ICD-10-CM | POA: Insufficient documentation

## 2018-05-25 DIAGNOSIS — Z882 Allergy status to sulfonamides status: Secondary | ICD-10-CM | POA: Insufficient documentation

## 2018-05-25 DIAGNOSIS — F418 Other specified anxiety disorders: Secondary | ICD-10-CM | POA: Insufficient documentation

## 2018-05-25 DIAGNOSIS — Z7982 Long term (current) use of aspirin: Secondary | ICD-10-CM | POA: Insufficient documentation

## 2018-05-25 DIAGNOSIS — I10 Essential (primary) hypertension: Secondary | ICD-10-CM

## 2018-05-25 DIAGNOSIS — D860 Sarcoidosis of lung: Secondary | ICD-10-CM | POA: Insufficient documentation

## 2018-05-25 NOTE — Telephone Encounter (Signed)
Spoke with pt's daughter and scheduled the pt for an appt on 06/01/2018 at 4:30pm. Pt's daughter is aware of appt date and time.

## 2018-05-25 NOTE — Telephone Encounter (Signed)
Would it be okay to use the 4:30 and 4:45 same day appts?

## 2018-05-25 NOTE — Progress Notes (Signed)
Patient ID: Pamela Collier, female    DOB: [birth year], 78 y.o.   MRN: [id number]  HPI  Pamela Collier is a 78 y/o female with a history of CAD, DM, hyperlipidemia, CKD, pulmonary sarcoidosis, depression, anxiety, COPD, obstructive sleep apnea and chronic heart failure.   Echo report from 04/27/18 reviewed and showed an EF of 60-65%.  Admitted 04/25/18 due to COPD/ HF exacerbation. Cardiology consult obtained. Initially given IV steroids and then transitioned to oral prednisone. HTN medications adjusted. Discharged after 5 days.   She presents today for her initial visit with a chief complaint of minimal fatigue upon moderate exertion. She describes this as having been present for several years. She has associated cough, pedal edema and shaking of the neck. She denies any difficulty sleeping, dizziness, abdominal distention, palpitations, chest pain, shortness of breath or weight gain. Has been at [facility] for rehab and will be going home tomorrow with home health.   Past Medical History:  Diagnosis Date  . Acute respiratory failure ([place]) 11/21/2014  . Anxiety   . Arthritis 08/05/2013  . Atherosclerotic peripheral vascular disease with gangrene [facility]) august 2012  . Cardiomyopathy, ischemic    a. EF 35 to 40% by echo in 2013 b. EF improved to 50-55% by echo in 04/2015; 11/2016 Echo: EF 55-60%, Gr1 DD, mildly dil LA, nl RV fxn.  . Cerebral infarct ([place]) 08/17/2013  . Chronic diastolic CHF (congestive heart failure) (HCC)    a. EF 50-55% by echo in 04/2015; b. 11/2016 Echo: EF 55-60%, Gr1 DD.  [name] COPD (chronic obstructive pulmonary disease) ([place])   . Coronary artery disease, occlusive    a. Previous PCI to the LAD, LCx, and RCA in 2010, 2011, 2013, and 2016. All performed in [place]; b. 11/2016 Lexiscan MV: EF 43%, no ischemia->low risk.  . 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 Diabetic diarrhea ([place]) 10/03/2014  . DM type 2, uncontrolled, with renal complications ([place]) 01/13/7653  . Hepatic  steatosis    by CT abd pelvis  . History of kidney stones    at a younger age  . Hyperlipidemia LDL goal <100 02/23/2014  . Hypertensive heart disease   . Osteoporosis, post-menopausal   . 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 Pill esophagitis   . Pleural effusion 10/25/2012   10/2012 CT chest >> small to moderate R lung effusion>> chylothorax, 100% lymphs 10/2013 thoracentesis> cytology negative, WBC 1471, > 90% "small lymphs" 01/2014 CT chest> near complete resolution of pleural effusion, stable lymphadenopathy 08/2014 CT chest [place] ([facility]): small right sided effusion decreased in size, stable mediastinal lymphadenopathy 1.0cm largest, 62m . Pulmonary sarcoidosis [place] 12/07/2012   [place] 03/2013 Full PFT [facility] > UNACCEPTABLE AND NOT REPRODUCIBLE DATA> Ratio 71% FEV 1 1.02 L (55% pred), FVC 1.31 L (49% pred) could not do lung volumes or DLCO   . Sleep apnea   . Stage III chronic kidney disease ([facility]    Past Surgical History:  Procedure Laterality Date  . ABDOMINAL HYSTERECTOMY     at ge 39. secondary to bleeding/partial  . ANTERIOR CERVICAL DECOMP/DISCECTOMY FUSION N/A 10/10/2016   Procedure: Anterior Cervical Discectomy Fusion - Cervical four4- five - Cervical five-Cervical six - Cervical six-Cervical seven;  Surgeon: PEarnie Larsson MD;  [place]  Service: Neurosurgery;  Laterality: N/A;  .  BREAST CYST ASPIRATION Right   . CARPAL TUNNEL RELEASE Bilateral   . CHOLECYSTECTOMY     in [place]   . COLONOSCOPY WITH PROPOFOL N/A 01/09/2015   Procedure: COLONOSCOPY WITH PROPOFOL;  Surgeon: Lucilla Lame, MD;  Location: [facility] ENDOSCOPY;  Service: Endoscopy;  Laterality: N/A;  . CORONARY ANGIOPLASTY WITH STENT PLACEMENT     [place]; [facility]  . EYE SURGERY     bil cataracts  . HERNIA  REPAIR     umbilical/ Dr Pat Patrick  . OOPHORECTOMY    . PTCA  August 2012   Right Posterior tibial artery , [place]  . TOE AMPUTATION  Sept 2012   Right 2nd toe, [place]   Family History  Problem Relation Age of Onset  . Heart disease Father   . Heart attack Father   . Heart disease Sister   . Heart attack Sister   . Heart disease Brother   . Heart attack Brother   . Tremor Mother   . Asthma Grandchild   . Breast cancer Maternal Aunt    Social History   Tobacco Use  . Smoking status: Never Smoker  . Smokeless tobacco: Never Used  Substance Use Topics  . Alcohol use: No    Alcohol/week: 0.0 standard drinks   Allergies  Allergen Reactions  . Cephalexin Itching  . Contrast Media [Iodinated Diagnostic Agents] Itching  . Doxycycline Itching  . Gabapentin Itching  . Sulfa Antibiotics Itching   Prior to Admission medications   Medication Sig Start Date End Date Taking? Authorizing Provider  acetaminophen (TYLENOL) 500 MG tablet Take 1,000 mg by mouth every 6 (six) hours as needed for mild pain or headache.    Yes [provider]  allopurinol (ZYLOPRIM) 100 MG tablet Take 1 tablet (100 mg total) by mouth daily. 03/22/18 03/22/19 Yes Wieting, Richard, MD  ALPRAZolam [name]) 0.25 MG tablet Take 1 tablet (0.25 mg total) by mouth daily. 04/30/18  Yes Henreitta Leber, MD  amLODipine (NORVASC) 5 MG tablet Take 2 tablets (10 mg total) by mouth daily. 04/30/18  Yes Henreitta Leber, MD  aspirin EC 81 MG tablet Take 81 mg by mouth daily.   Yes [provider]  B-D UF III MINI PEN NEEDLES 31G X 5 MM MISC USE THREE TIMES A DAY 02/18/18  Yes [name], MD  blood glucose meter kit and supplies Dispense based on patient and insurance preference. Use up to three times daily as directed. (FOR ICD-10 E11.21) 02/18/18  Yes [name], MD  dicyclomine (BENTYL) 10 MG capsule Take 1 capsule (10 mg total) by mouth 4 (four) times daily -  before meals and at bedtime. 03/10/18  Yes  [name], MD  diphenoxylate-atropine (LOMOTIL) 2.5-0.025 MG tablet Take 1 tablet by mouth 4 (four) times daily as needed for diarrhea or loose stools. 03/22/18  Yes Wieting, Richard, MD  donepezil (ARICEPT) 10 MG tablet Take 10 mg by mouth at bedtime.   Yes [provider]  feeding supplement, GLUCERNA SHAKE, (GLUCERNA SHAKE) LIQD Take 237 mLs by mouth 2 (two) times daily between meals. 05/27/17  Yes Gouru, Illene Silver, MD  fluticasone (FLONASE) 50 MCG/ACT nasal spray Place 2 sprays into both nostrils daily as needed for rhinitis. 01/20/18  Yes [name], MD  furosemide (LASIX) 20 MG tablet Take 1 tablet (20 mg ) once daily on Monday, Wednesday and Friday. 05/17/18  Yes Dunn, Areta Haber, PA-C  glucose blood (ACCU-CHEK AVIVA PLUS) test  strip USE TO CHECK GLUCOSE THREE TIMES DAILY 02/16/18  Yes [name], MD  glucose blood test strip Use to check blood sugars 2 times daily 02/16/18  Yes [name], MD  guaiFENesin (MUCINEX) 600 MG 12 hr tablet Take 600 mg by mouth 2 (two) times daily.   Yes [provider]  guaiFENesin-dextromethorphan (ROBITUSSIN DM) 100-10 MG/5ML syrup Take 5 mLs by mouth every 4 (four) hours as needed for cough. 04/30/18  Yes Henreitta Leber, MD  hydrALAZINE (APRESOLINE) 100 MG tablet Take 1 tablet (100 mg total) by mouth every 8 (eight) hours. 04/30/18  Yes Sainani, Belia Heman, MD  insulin aspart protamine- aspart (NOVOLOG MIX 70/30) (70-30) 100 UNIT/ML injection Inject 0.3 mLs (30 Units total) into the skin 2 (two) times daily with a meal. 03/22/18  Yes Wieting, Richard, MD  isosorbide mononitrate (IMDUR) 30 MG 24 hr tablet Take 1 tablet (30 mg total) by mouth daily. 03/03/18  Yes Gollan, Kathlene November, MD  mometasone-formoterol (DULERA) 200-5 MCG/ACT AERO Inhale 2 puffs into the lungs 2 (two) times daily. 02/01/18  Yes [name], MD  nitroGLYCERIN (NITROLINGUAL) 0.4 MG/SPRAY spray Place 1 spray under the tongue every 5 (five) minutes x 3 doses  as needed for chest pain. 01/14/17  Yes [name], MD  ondansetron (ZOFRAN) 4 MG tablet Take 4 mg by mouth every 8 (eight) hours as needed for nausea or vomiting.   Yes [provider]  predniSONE (DELTASONE) 10 MG tablet Take 10 mg by mouth daily with breakfast.   Yes [provider]  pregabalin (LYRICA) 100 MG capsule Take 100 mg by mouth 2 (two) times daily.    Yes [provider]  propranolol (INDERAL) 10 MG tablet Take 10 mg by mouth daily.   Yes [provider]  rosuvastatin (CRESTOR) 20 MG tablet Take 1 tablet (20 mg total) by mouth daily. 04/16/18  Yes [name], MD  senna (SENOKOT) 8.6 MG tablet Take 1 tablet by mouth 2 (two) times daily as needed for constipation.   Yes [provider]  ticagrelor (BRILINTA) 60 MG TABS tablet Take 1 tablet (60 mg total) by mouth 2 (two) times daily. 04/22/18  Yes Minna Merritts, MD    Review of Systems  Constitutional: Positive for fatigue. Negative for appetite change.  HENT: Positive for rhinorrhea. Negative for congestion and sore throat.   Eyes: Negative.   Respiratory: Positive for cough. Negative for chest tightness and shortness of breath.   Cardiovascular: Positive for leg swelling (ankles). Negative for chest pain and palpitations.  Gastrointestinal: Negative for abdominal distention and abdominal pain.  Endocrine: Negative.   Genitourinary: Negative.   Musculoskeletal: Negative for back pain and neck pain.  Skin: Negative.   Allergic/Immunologic: Negative.   Neurological: Positive for tremors (in neck). Negative for dizziness and light-headedness.  Hematological: Negative for adenopathy. Does not bruise/bleed easily.  Psychiatric/Behavioral: Negative for dysphoric mood and sleep disturbance (wearing CPAP at night and sleeping on 1 pillow). The patient is not nervous/anxious.     Vitals:   05/25/18 1324  BP: (!) 138/45  Pulse: 62  Resp: 18  SpO2: 97%  Weight: 182 lb (82.6 kg)   Height: 5' 7" (1.702 m)   Wt Readings from Last 3 Encounters:  05/25/18 182 lb (82.6 kg)  05/17/18 181 lb (82.1 kg)  04/30/18 178 lb 1.6 oz (80.8 kg)   Lab Results  Component Value Date   CREATININE 1.67 (H) 05/17/2018   CREATININE 1.35 (  H) 04/29/2018   CREATININE 1.55 (H) 04/28/2018    Physical Exam Vitals signs and nursing note reviewed.  Constitutional:      Appearance: Normal appearance.  HENT:     Head: Normocephalic and atraumatic.  Neck:     Musculoskeletal: Normal range of motion and neck supple.  Cardiovascular:     Rate and Rhythm: Normal rate and regular rhythm.  Pulmonary:     Effort: Pulmonary effort is normal.     Breath sounds: No wheezing or rales.  Abdominal:     Palpations: Abdomen is soft.     Tenderness: There is no abdominal tenderness.  Musculoskeletal:        General: No tenderness.     Right lower leg: Edema (1+ pitting) present.     Left lower leg: Edema (1+ pitting) present.  Skin:    General: Skin is warm and dry.  Neurological:     General: No focal deficit present.     Mental Status: She is alert and oriented to person, place, and time.     Comments: Tremor of the neck noted  Psychiatric:        Mood and Affect: Mood normal.        Behavior: Behavior normal.     Assessment & Plan:  1: Chronic heart failure with preserved ejection fraction- - NYHA class II - euvolemic today - being weighed daily and she was instructed to weigh daily at home and to call for an overnight weight gain of >2 pounds or a weekly weight gain of >5 pounds - saw cardiology (Dunn) 05/17/2018 - taking furosemide M, W & F - to be discharged from facility tomorrow with initiation of home health services through [facility] - BNP 04/25/18 was 282.0 - patient reports receiving her flu vaccine for this season  2: HTN- - BP looks good today - saw PCP Derrel Nip) 04/16/18 - BMP from 05/17/2018 reviewed and showed sodium 138, potassium 3.6, creatinine 1.67 and GFR 34  3:  DM- - A1c 02/10/18 was 7.7%  4: Lymphedema- - stage 2 - not wearing compression socks and an order was written for the facility to put the socks on every morning with removal at bedtime - encouraged her to elevate her legs when sitting for long periods of time - consider lymphapress compression boots if edema persists  Facility medication list was reviewed.  Return in 6 weeks or sooner for any questions/ problems before then.

## 2018-05-25 NOTE — Patient Instructions (Signed)
Continue weighing daily and call for an overnight weight gain of > 2 pounds or a weekly weight gain of >5 pounds. 

## 2018-05-25 NOTE — Telephone Encounter (Signed)
Yes that is fine use both slots

## 2018-05-25 NOTE — Telephone Encounter (Signed)
LMTCB. Please transfer to our office.

## 2018-05-25 NOTE — Telephone Encounter (Signed)
Copied from [place] [id number]. Topic: General - Other >> May 25, 2018  1:00 PM Percell Belt A wrote: Reason for CRM:   Pt daughter called in and stated that they are in town on the 20th, they are think of transfering pt back to [place].  She is requesting a workin on jan 20th or 21st?  She needs to know what the pt issue are and what is going on she she can get the care pt needs in [place] if that's what they decide to do.  There is no other day that would work from them because they will be gone.  She wanted to bring pt in with her and have a consult.   Best number to [phone]

## 2018-05-27 ENCOUNTER — Other Ambulatory Visit: Payer: Self-pay | Admitting: *Deleted

## 2018-05-27 ENCOUNTER — Telehealth: Payer: Self-pay | Admitting: Internal Medicine

## 2018-05-27 DIAGNOSIS — N184 Chronic kidney disease, stage 4 (severe): Secondary | ICD-10-CM | POA: Diagnosis not present

## 2018-05-27 DIAGNOSIS — I130 Hypertensive heart and chronic kidney disease with heart failure and stage 1 through stage 4 chronic kidney disease, or unspecified chronic kidney disease: Secondary | ICD-10-CM | POA: Diagnosis not present

## 2018-05-27 DIAGNOSIS — E785 Hyperlipidemia, unspecified: Secondary | ICD-10-CM | POA: Diagnosis not present

## 2018-05-27 DIAGNOSIS — M1990 Unspecified osteoarthritis, unspecified site: Secondary | ICD-10-CM | POA: Diagnosis not present

## 2018-05-27 DIAGNOSIS — F0390 Unspecified dementia without behavioral disturbance: Secondary | ICD-10-CM | POA: Diagnosis not present

## 2018-05-27 DIAGNOSIS — L84 Corns and callosities: Secondary | ICD-10-CM | POA: Diagnosis not present

## 2018-05-27 DIAGNOSIS — R911 Solitary pulmonary nodule: Secondary | ICD-10-CM | POA: Diagnosis not present

## 2018-05-27 DIAGNOSIS — E1122 Type 2 diabetes mellitus with diabetic chronic kidney disease: Secondary | ICD-10-CM | POA: Diagnosis not present

## 2018-05-27 DIAGNOSIS — E509 Vitamin A deficiency, unspecified: Secondary | ICD-10-CM | POA: Diagnosis not present

## 2018-05-27 DIAGNOSIS — I255 Ischemic cardiomyopathy: Secondary | ICD-10-CM | POA: Diagnosis not present

## 2018-05-27 DIAGNOSIS — G4733 Obstructive sleep apnea (adult) (pediatric): Secondary | ICD-10-CM | POA: Diagnosis not present

## 2018-05-27 DIAGNOSIS — I5032 Chronic diastolic (congestive) heart failure: Secondary | ICD-10-CM | POA: Diagnosis not present

## 2018-05-27 DIAGNOSIS — D631 Anemia in chronic kidney disease: Secondary | ICD-10-CM | POA: Diagnosis not present

## 2018-05-27 DIAGNOSIS — M5412 Radiculopathy, cervical region: Secondary | ICD-10-CM | POA: Diagnosis not present

## 2018-05-27 DIAGNOSIS — E1140 Type 2 diabetes mellitus with diabetic neuropathy, unspecified: Secondary | ICD-10-CM | POA: Diagnosis not present

## 2018-05-27 DIAGNOSIS — M4802 Spinal stenosis, cervical region: Secondary | ICD-10-CM | POA: Diagnosis not present

## 2018-05-27 DIAGNOSIS — J449 Chronic obstructive pulmonary disease, unspecified: Secondary | ICD-10-CM | POA: Diagnosis not present

## 2018-05-27 DIAGNOSIS — Z7951 Long term (current) use of inhaled steroids: Secondary | ICD-10-CM | POA: Diagnosis not present

## 2018-05-27 DIAGNOSIS — M810 Age-related osteoporosis without current pathological fracture: Secondary | ICD-10-CM | POA: Diagnosis not present

## 2018-05-27 DIAGNOSIS — Z7902 Long term (current) use of antithrombotics/antiplatelets: Secondary | ICD-10-CM | POA: Diagnosis not present

## 2018-05-27 DIAGNOSIS — F418 Other specified anxiety disorders: Secondary | ICD-10-CM | POA: Diagnosis not present

## 2018-05-27 DIAGNOSIS — Z981 Arthrodesis status: Secondary | ICD-10-CM | POA: Diagnosis not present

## 2018-05-27 DIAGNOSIS — I2510 Atherosclerotic heart disease of native coronary artery without angina pectoris: Secondary | ICD-10-CM | POA: Diagnosis not present

## 2018-05-27 DIAGNOSIS — D869 Sarcoidosis, unspecified: Secondary | ICD-10-CM | POA: Diagnosis not present

## 2018-05-27 DIAGNOSIS — E1151 Type 2 diabetes mellitus with diabetic peripheral angiopathy without gangrene: Secondary | ICD-10-CM | POA: Diagnosis not present

## 2018-05-27 NOTE — Telephone Encounter (Signed)
Copied from [place] [id number]. Topic: Quick Communication - Home Health Verbal Orders >> May 27, 2018  9:18 AM Scherrie Gerlach wrote: Caller/Agency: [facility] Callback Number: [phone] Requesting Skilled verbal Nursing/home health aid Frequency: home health aid    2 wk 3  Nursing  2 wk 3 1 wk 3 2 as needed

## 2018-05-27 NOTE — Patient Outreach (Signed)
[facility]) Care Management  [place]  05/27/2018   Pamela Collier [birth year] [id number] Transition of care   12/2 Received message patient message discharged from [facility] over the weekend  Patient admission to [facility] 11/8-11/11 Dx: Near Syncope , fall  PMHX significant for Diabetes, Chronic Diastolic Heart failure, COPD, Pulmonary Sacroidosis, Sleep Apnea , CVA.Mild Dementia ,   Readmission at [facility] 12/15-12/20, Dx: Acute on Chronic Diastolic heart failure .  [facility] 12/20 - 1/13.   Patient followed by [facility] care coordinator prior to admission .   Subjective:   Unsuccessful outreach call to patient , no answer able to leave a HIPAA compliant message for return call.  Returned call to patient , she discussed glad to be back home . She discussed her recent rehab stay.   Patient reports : Social  Living at home with her husband , he is able to prepare patient meals . Patient had meals on wheels prior to admission and he plans to call and resume. Patient has her husband,for transportation needs and her son , daughter in law will be coming into town on tomorrow to visit and follow up care needs.  Patient uses walker for mobility in home. Patient had [facility] on today and plan next visit on patient states Friday, she will be followed by physical therapy as well as bath aide.   Conditions;   Heart failure  Patient reports not weighing on this morning due to early arrival of home health .  Patient has scales at home to weigh on and reports she will be able to stand on scales to weigh.  Discussed worsening symptoms of Heart failure, patient denies shortness of breath, reports recent weight was 183 at rehab, ( noted weight at heart failure clinic visit on 1/14 was 183 ) . Patient reports having swelling in her ankles that has not increased in last 3 days , states that she was encouraged to elevate legs while  sitting in the recliner. Patient does not have support hose on encouraged her to wear .   Diabetes Patient continues to monitor her blood sugar at home, today's reading 240, taking 70/30 insulin . Reviewed low blood sugar readings and treatment with patient and she is able to recall of hypoglycemia and how to treat.   Sleep Apnea Patient reports wearing CPAP and rested well on last night.  Medications  Patient unable to review medications at this time, she receives her medication through [facility] with compliance packaing and her husband has taken new  Prescriptions from rehab to pharmacy to repackage of her compliance packaged medications plans to pick up later today.   Appointments  PCP post hospital visit is scheduled for 1/21, family will provide transportation.    Encounter Medications:  Outpatient Encounter Medications as of 05/27/2018  Medication Sig Note  . acetaminophen (TYLENOL) 500 MG tablet Take 1,000 mg by mouth every 6 (six) hours as needed for mild pain or headache.    . allopurinol (ZYLOPRIM) 100 MG tablet Take 1 tablet (100 mg total) by mouth daily.   [name] ALPRAZolam (XANAX) 0.25 MG tablet Take 1 tablet (0.25 mg total) by mouth daily.   [name] amLODipine (NORVASC) 5 MG tablet Take 2 tablets (10 mg total) by mouth daily.   [name] aspirin EC 81 MG tablet Take 81 mg by mouth daily.   . B-D UF III MINI PEN NEEDLES 31G X 5 MM MISC  USE THREE TIMES A DAY   . blood glucose meter kit and supplies Dispense based on patient and insurance preference. Use up to three times daily as directed. 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 dicyclomine (BENTYL) 10 MG capsule Take 1 capsule (10 mg total) by mouth 4 (four) times daily -  before meals and at bedtime.   . diphenoxylate-atropine (LOMOTIL) 2.5-0.025 MG tablet Take 1 tablet by mouth 4 (four) times daily as needed for diarrhea or loose stools.   . donepezil (ARICEPT) 10 MG tablet Take 10 mg by mouth at bedtime.   . feeding supplement, GLUCERNA SHAKE, (GLUCERNA  SHAKE) LIQD Take 237 mLs by mouth 2 (two) times daily between meals. 03/03/2018: Takes once a day due to cost  . fluticasone (FLONASE) 50 MCG/ACT nasal spray Place 2 sprays into both nostrils daily as needed for rhinitis.   . furosemide (LASIX) 20 MG tablet Take 1 tablet (20 mg ) once daily on Monday, Wednesday and Friday.   [name] glucose blood (ACCU-CHEK AVIVA PLUS) test strip USE TO CHECK GLUCOSE THREE TIMES DAILY   . glucose blood test strip Use to check blood sugars 2 times daily   . guaiFENesin (MUCINEX) 600 MG 12 hr tablet Take 600 mg by mouth 2 (two) times daily.   [name] guaiFENesin-dextromethorphan (ROBITUSSIN DM) 100-10 MG/5ML syrup Take 5 mLs by mouth every 4 (four) hours as needed for cough.   . hydrALAZINE (APRESOLINE) 100 MG tablet Take 1 tablet (100 mg total) by mouth every 8 (eight) hours.   . insulin aspart protamine- aspart (NOVOLOG MIX 70/30) (70-30) 100 UNIT/ML injection Inject 0.3 mLs (30 Units total) into the skin 2 (two) times daily with a meal.   . isosorbide mononitrate (IMDUR) 30 MG 24 hr tablet Take 1 tablet (30 mg total) by mouth daily.   . mometasone-formoterol (DULERA) 200-5 MCG/ACT AERO Inhale 2 puffs into the lungs 2 (two) times daily.   . nitroGLYCERIN (NITROLINGUAL) 0.4 MG/SPRAY spray Place 1 spray under the tongue every 5 (five) minutes x 3 doses as needed for chest pain.   [name] ondansetron (ZOFRAN) 4 MG tablet Take 4 mg by mouth every 8 (eight) hours as needed for nausea or vomiting.   . predniSONE (DELTASONE) 10 MG tablet Take 10 mg by mouth daily with breakfast.   . pregabalin (LYRICA) 100 MG capsule Take 100 mg by mouth 2 (two) times daily.    . propranolol (INDERAL) 10 MG tablet Take 10 mg by mouth daily.   . rosuvastatin (CRESTOR) 20 MG tablet Take 1 tablet (20 mg total) by mouth daily.   [name] senna (SENOKOT) 8.6 MG tablet Take 1 tablet by mouth 2 (two) times daily as needed for constipation.   . ticagrelor (BRILINTA) 60 MG TABS tablet Take 1 tablet (60 mg total) by mouth  2 (two) times daily.    Facility-Administered Encounter Medications as of 05/27/2018  Medication  . albuterol (PROVENTIL) (5 MG/ML) 0.5% nebulizer solution 2.5 mg    Functional Status:  In your present state of health, do you have any difficulty performing the following activities: 05/27/2018 05/25/2018  Hearing? N N  Vision? N N  Difficulty concentrating or making decisions? Y N  Walking or climbing stairs? Y N  Comment uses walker  -  Dressing or bathing? Y N  Doing errands, shopping? Tempie Donning  Comment spouse assist  -  Conservation officer, nature and eating ? Y -  Comment husband assist and has meals on wheels  -  Using the  Toilet? N -  In the past six months, have you accidently leaked urine? Y -  Do you have problems with loss of bowel control? N -  Managing your Medications? Y -  Comment compliance packaging  -  Managing your Finances? Y -  Housekeeping or managing your Housekeeping? Y -  Comment spouse assist  -  Some recent data might be hidden    Fall/Depression Screening: Fall Risk  05/27/2018 05/25/2018 04/23/2018  Falls in the past year? _0 Comment - - -  Number falls in past yr: 1 0 1  Injury with Fall? 1 1 0  Risk Factor Category  - - -  Risk for fall due to : Impaired mobility;Impaired balance/gait;History of fall(s) - Impaired balance/gait;History of fall(s)  Risk for fall due to: Comment - - -  Follow up - - -   PHQ 2/9 Scores 05/27/2018 04/23/2018 08/24/2017 11/10/2016 07/01/2016 02/12/2016 12/03/2015  PHQ - 2 Score _1 0 0 3 0  PHQ- 9 Score - - 6 - - 7 -  Exception Documentation - - - - - - -    Plan:  Patient will receive weekly transition of care follow up calls, explained to patient co worker [name] will contact her in the next week.  Reinforced worsening heart failure symptom with patient to notify MD of .  Will send PCP barrier involvement letter .   THN CM Care Plan Problem One     Most Recent Value  Care Plan Problem One  At risk for readmission related to  recent discharge from rehab after hospital admission for Heart failure   Role Documenting the Problem One  Care Management [place] for Problem One  Active  [facility] Long Term Goal   Patient will not experience a hospital admission over the next 60 days   THN Long Term Goal Start Date  05/27/18  Interventions for Problem One Long Term Goal  Advised regarding daily care of heart failure management , taking medications as prescribed, weighing daily, follow up with MD of worsening symptoms limiting salt in diet.   THN CM Short Term Goal #1   Over the next 30 days patient will be able to report weighing self daily and keeping a record  THN CM Short Term Goal #1 Start Date  05/27/18  Interventions for Short Term Goal #1  Advised patient importance of keeping track of weights daily to identify , sudden changes . Reviewed best time of day to weigh in am after going to bathoom and before getting dressed.   THN CM Short Term Goal #2   Patient will be able to reports no falls over the next 30 days   THN CM Short Term Goal #2 Start Date  05/27/18  Interventions for Short Term Goal #2  Reviewed fall prevention using walker at all times, standing a few seconds when rising from chair before she starts to walk.   THN CM Short Term Goal #3  Patient will be able to verbalize at least 2 symptoms of worsening COPD over the next 30 days   THN CM Short Term Goal #3 Start Date  05/27/18  Interventions for Short Tern Goal #3  Review of worsening COPD symptoms, weight gain of 2 pounds in a day and 5 in a week , increased swelling , shortness of breath , encouraged to notify MD of this symptoms sooner       [name], RN, [facility]  Care Management,Care Management Coordinator  [phone]- Mobile [phone]- Toll Free Main Office

## 2018-05-28 ENCOUNTER — Telehealth: Payer: Self-pay | Admitting: *Deleted

## 2018-05-28 DIAGNOSIS — I130 Hypertensive heart and chronic kidney disease with heart failure and stage 1 through stage 4 chronic kidney disease, or unspecified chronic kidney disease: Secondary | ICD-10-CM | POA: Diagnosis not present

## 2018-05-28 DIAGNOSIS — E1122 Type 2 diabetes mellitus with diabetic chronic kidney disease: Secondary | ICD-10-CM | POA: Diagnosis not present

## 2018-05-28 DIAGNOSIS — L851 Acquired keratosis [keratoderma] palmaris et plantaris: Secondary | ICD-10-CM | POA: Diagnosis not present

## 2018-05-28 DIAGNOSIS — J449 Chronic obstructive pulmonary disease, unspecified: Secondary | ICD-10-CM | POA: Diagnosis not present

## 2018-05-28 DIAGNOSIS — E1042 Type 1 diabetes mellitus with diabetic polyneuropathy: Secondary | ICD-10-CM | POA: Diagnosis not present

## 2018-05-28 DIAGNOSIS — D631 Anemia in chronic kidney disease: Secondary | ICD-10-CM | POA: Diagnosis not present

## 2018-05-28 DIAGNOSIS — B351 Tinea unguium: Secondary | ICD-10-CM | POA: Diagnosis not present

## 2018-05-28 DIAGNOSIS — N184 Chronic kidney disease, stage 4 (severe): Secondary | ICD-10-CM | POA: Diagnosis not present

## 2018-05-28 DIAGNOSIS — I5032 Chronic diastolic (congestive) heart failure: Secondary | ICD-10-CM | POA: Diagnosis not present

## 2018-05-28 NOTE — Telephone Encounter (Signed)
Left detailed message return call to office , [place] nurse may give verbal as written.

## 2018-05-28 NOTE — Telephone Encounter (Signed)
Copied from [place] [id number]. Topic: General - Other >> May 28, 2018  1:53 PM Oneta Rack wrote: Osvaldo Human name: Colletta [place]  Relation to pt: OT from [facility]   Call back number: [phone]    Reason for call:  Requesting verbal orders for OT 1x 4

## 2018-06-01 ENCOUNTER — Encounter: Payer: Self-pay | Admitting: Internal Medicine

## 2018-06-01 ENCOUNTER — Ambulatory Visit (INDEPENDENT_AMBULATORY_CARE_PROVIDER_SITE_OTHER): Payer: Medicare Other | Admitting: Internal Medicine

## 2018-06-01 DIAGNOSIS — N183 Chronic kidney disease, stage 3 (moderate): Secondary | ICD-10-CM | POA: Diagnosis not present

## 2018-06-01 DIAGNOSIS — L89151 Pressure ulcer of sacral region, stage 1: Secondary | ICD-10-CM | POA: Diagnosis not present

## 2018-06-01 DIAGNOSIS — E1142 Type 2 diabetes mellitus with diabetic polyneuropathy: Secondary | ICD-10-CM

## 2018-06-01 DIAGNOSIS — E1122 Type 2 diabetes mellitus with diabetic chronic kidney disease: Secondary | ICD-10-CM | POA: Diagnosis not present

## 2018-06-01 DIAGNOSIS — I129 Hypertensive chronic kidney disease with stage 1 through stage 4 chronic kidney disease, or unspecified chronic kidney disease: Secondary | ICD-10-CM

## 2018-06-01 DIAGNOSIS — I6523 Occlusion and stenosis of bilateral carotid arteries: Secondary | ICD-10-CM | POA: Diagnosis not present

## 2018-06-01 MED ORDER — DERMACLOUD EX CREA: TOPICAL_CREAM | CUTANEOUS | 0 refills | Status: DC

## 2018-06-01 NOTE — Progress Notes (Signed)
Subjective:  Patient ID: Pamela Collier, female    DOB: 1941-02-03  Age: 78 y.o. 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  HPI [name] presents for family meeting to discuss patient's health issues .  patientn is accompanied by her husband, dtr in law and son who have come [place] from [place] and preparing to take her back witih then in  3 to 4 months  Taking insulin twice daily mixed  nost sure how much to be taking .  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 acetaminophen (TYLENOL) 500 MG tablet Take 1,000 mg by mouth every 6 (six) hours as needed for mild pain or headache.     . allopurinol (ZYLOPRIM) 100 MG tablet Take 1 tablet (100 mg total) by mouth daily. 30 tablet 0  . ALPRAZolam (XANAX) 0.25 MG tablet Take 1 tablet (0.25 mg total) by mouth daily. 30 tablet 0  . amLODipine (NORVASC) 5 MG tablet Take 2 tablets (10 mg total) by mouth daily. 90 tablet 1  . aspirin EC 81 MG tablet Take 81 mg by mouth daily.    . B-D UF III MINI PEN NEEDLES 31G X 5 MM MISC USE THREE  TIMES A DAY 270 each 4  . blood glucose meter kit and supplies Dispense based on patient and insurance preference. Use up to three times daily as directed. (FOR ICD-10 E11.21) 1 each 0  . dicyclomine (BENTYL) 10 MG capsule Take 1 capsule (10 mg total) by mouth 4 (four) times daily -  before meals and at bedtime. 120 capsule 0  . diphenoxylate-atropine (LOMOTIL) 2.5-0.025 MG tablet Take 1 tablet by mouth 4 (four) times daily as needed for diarrhea or loose stools. 10 tablet 0  . donepezil (ARICEPT) 10 MG tablet Take 10 mg by mouth at bedtime.    . feeding supplement, GLUCERNA SHAKE, (GLUCERNA SHAKE) LIQD Take 237 mLs by mouth 2 (two) times daily between meals. 60 Can 0  . fluticasone (FLONASE) 50 MCG/ACT nasal spray Place 2 sprays into both nostrils daily as needed for rhinitis. 16 g 5  . furosemide (LASIX) 20 MG tablet Take 1 tablet (20 mg ) once daily on Monday, Wednesday and Friday. 90 tablet 3  . glucose blood (ACCU-CHEK AVIVA PLUS) test strip USE TO CHECK GLUCOSE THREE TIMES DAILY 200 each 5  . glucose blood test strip Use to check blood sugars 2 times daily 100 each 12  . guaiFENesin (MUCINEX) 600 MG 12 hr tablet Take 600 mg by  mouth 2 (two) times daily.    [name] guaiFENesin-dextromethorphan (ROBITUSSIN DM) 100-10 MG/5ML syrup Take 5 mLs by mouth every 4 (four) hours as needed for cough. 118 mL 0  . hydrALAZINE (APRESOLINE) 100 MG tablet Take 1 tablet (100 mg total) by mouth every 8 (eight) hours.    . insulin aspart protamine- aspart (NOVOLOG MIX 70/30) (70-30) 100 UNIT/ML injection Inject 0.3 mLs (30 Units total) into the skin 2 (two) times daily with a meal. 10 mL 11  . isosorbide mononitrate (IMDUR) 30 MG 24 hr tablet Take 1 tablet (30 mg total) by mouth daily. 90 tablet 2  . mometasone-formoterol (DULERA) 200-5 MCG/ACT AERO Inhale 2 puffs into the lungs 2 (two) times daily. 13 g 3  . nitroGLYCERIN (NITROLINGUAL) 0.4 MG/SPRAY spray Place 1 spray under the tongue every 5 (five) minutes x 3 doses  as needed for chest pain. 12 g 12  . ondansetron (ZOFRAN) 4 MG tablet Take 4 mg by mouth every 8 (eight) hours as needed for nausea or vomiting.    . predniSONE (DELTASONE) 10 MG tablet Take 10 mg by mouth daily with breakfast.    . pregabalin (LYRICA) 100 MG capsule Take 100 mg by mouth 2 (two) times daily.     . propranolol (INDERAL) 10 MG tablet Take 10 mg by mouth daily.    . rosuvastatin (CRESTOR) 20 MG tablet Take 1 tablet (20 mg total) by mouth daily. 90 tablet 2  . senna (SENOKOT) 8.6 MG tablet Take 1 tablet by mouth 2 (two) times daily as needed for constipation.    . ticagrelor (BRILINTA) 60 MG TABS tablet Take 1 tablet (60 mg total) by mouth 2 (two) times daily. 60 tablet 11   Facility-Administered Medications Prior to Visit  Medication Dose Route Frequency Provider Last Rate Last Dose  . albuterol (PROVENTIL) (5 MG/ML) 0.5% nebulizer solution 2.5 mg  2.5 mg Nebulization Once [name], MD        Review of Systems;  Patient denies headache, fevers, malaise, unintentional weight loss, skin rash, eye pain, sinus congestion and sinus pain, sore throat, dysphagia,  hemoptysis , cough, dyspnea, wheezing, chest pain, palpitations, orthopnea, edema, abdominal pain, nausea, melena, diarrhea, constipation, flank pain, dysuria, hematuria, urinary  Frequency, nocturia, numbness, tingling, seizures,  Focal weakness, Loss of consciousness,  Tremor, insomnia, depression, anxiety, and suicidal ideation.      Objective:  BP (!) 162/60 (BP Location: Left Arm, Patient Position: Sitting, Cuff Size: Normal)   Pulse 82   Temp 98.9 F (37.2 C) (Oral)   Resp 15   Ht 5' 7"  (1.702 m)   Wt 188 lb 9.6 oz (85.5 kg)   SpO2 95%   BMI 29.54 kg/m   BP Readings from Last 3 Encounters:  06/01/18 (!) 162/60  05/25/18 (!) 138/45  05/17/18 (!) 122/50    Wt Readings from Last 3 Encounters:  06/01/18 188 lb 9.6 oz (85.5 kg)  05/25/18 182 lb (82.6 kg)  05/17/18 181 lb (82.1 kg)    General  appearance: alert, cooperative and appears stated age Ears: normal TM's and external ear canals both ears Throat: lips, mucosa, and tongue normal; teeth and gums normal Neck: no adenopathy, no carotid bruit, supple, symmetrical, trachea midline and thyroid not enlarged, symmetric, no tenderness/mass/nodules Back: symmetric, no curvature. ROM normal. No CVA tenderness. Lungs: clear to auscultation bilaterally Heart: regular rate and rhythm, S1, S2 normal, no murmur, click, rub or gallop Abdomen: soft, non-tender; bowel sounds normal; no masses,  no  organomegaly Pulses: 2+ and symmetric Skin: Skin color, texture, turgor normal. No rashes or lesions Lymph nodes: Cervical, supraclavicular, and axillary nodes normal.  Lab Results  Component Value Date   HGBA1C 7.7 (H) 02/10/2018   HGBA1C 7.7 (H) 09/25/2017   HGBA1C 7.5 (H) 06/08/2017    Lab Results  Component Value Date   CREATININE 1.67 (H) 05/17/2018   CREATININE 1.35 (H) 04/29/2018   CREATININE 1.55 (H) 04/28/2018    Lab Results  Component Value Date   WBC 12.3 (H) 05/17/2018   HGB 10.8 (L) 05/17/2018   HCT 33.1 (L) 05/17/2018   PLT 227 05/17/2018   GLUCOSE 111 (H) 05/17/2018   CHOL 124 11/15/2016   TRIG 175 (H) 11/15/2016   HDL 29 (L) 11/15/2016   LDLDIRECT 102.0 03/14/2016   LDLCALC 60 11/15/2016   ALT 15 04/25/2018   AST 22 04/25/2018   NA 138 05/17/2018   K 3.6 05/17/2018   CL 101 05/17/2018   CREATININE 1.67 (H) 05/17/2018   BUN 21 05/17/2018   CO2 23 05/17/2018   TSH 0.758 04/25/2018   INR 1.2 (H) 02/02/2015   HGBA1C 7.7 (H) 02/10/2018   MICROALBUR 28.9 (H) 09/26/2015    No results found.  Assessment & Plan:   Problem List Items Addressed This Visit    Decubitus ulcer of sacral region, stage 1    Very early . 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  She 30 units twice daily   Lab Results  Component Value Date   HGBA1C 7.7 (H) 02/10/2018          A total of 40 minutes was spent with patient more than half of which was spent in counseling patient on the above mentioned issues , reviewing and explaining recent labs and imaging studies done, and coordination of care.  I am having Junnie L. Neuzil start on [place]. I am also having her maintain her acetaminophen, nitroGLYCERIN, feeding supplement (GLUCERNA SHAKE), fluticasone, mometasone-formoterol, B-D UF III MINI PEN NEEDLES, glucose blood, glucose blood, blood glucose meter kit and supplies, isosorbide mononitrate, dicyclomine, aspirin EC, diphenoxylate-atropine, insulin aspart protamine- aspart, allopurinol, rosuvastatin, ticagrelor, amLODipine, hydrALAZINE, ALPRAZolam, guaiFENesin-dextromethorphan, ondansetron, donepezil, propranolol, pregabalin, senna, predniSONE, furosemide, and guaiFENesin.  Meds ordered this encounter  Medications  . Infant Care Products (DERMACLOUD) CREA    Sig: Apply frequent to sacral ulcer    Dispense:  430 g    Refill:  0    There are no discontinued medications.  Follow-up: Return in about 4 weeks (around 06/29/2018).   [name], MD

## 2018-06-01 NOTE — Patient Instructions (Signed)
Start with 30 units of mixed insulin daily  Check sugars twice daily are various times (use the charts)  We will adjust insulin dose weekly  until fasting sugars  are btween 90 to 130  And post prandial  Sugars  ( 2hrs after meal ) are between 100 and 160    Use Derma cloud on the spot on your bottom to protect it from getting infected by stool and urine   Stay off that spot as much as possible.

## 2018-06-02 ENCOUNTER — Telehealth: Payer: Self-pay

## 2018-06-02 ENCOUNTER — Other Ambulatory Visit: Payer: Self-pay

## 2018-06-02 ENCOUNTER — Telehealth: Payer: Self-pay | Admitting: Internal Medicine

## 2018-06-02 DIAGNOSIS — L89151 Pressure ulcer of sacral region, stage 1: Secondary | ICD-10-CM | POA: Insufficient documentation

## 2018-06-02 DIAGNOSIS — D631 Anemia in chronic kidney disease: Secondary | ICD-10-CM | POA: Diagnosis not present

## 2018-06-02 DIAGNOSIS — J449 Chronic obstructive pulmonary disease, unspecified: Secondary | ICD-10-CM | POA: Diagnosis not present

## 2018-06-02 DIAGNOSIS — N184 Chronic kidney disease, stage 4 (severe): Secondary | ICD-10-CM | POA: Diagnosis not present

## 2018-06-02 DIAGNOSIS — I5032 Chronic diastolic (congestive) heart failure: Secondary | ICD-10-CM | POA: Diagnosis not present

## 2018-06-02 DIAGNOSIS — E1122 Type 2 diabetes mellitus with diabetic chronic kidney disease: Secondary | ICD-10-CM | POA: Diagnosis not present

## 2018-06-02 DIAGNOSIS — I130 Hypertensive heart and chronic kidney disease with heart failure and stage 1 through stage 4 chronic kidney disease, or unspecified chronic kidney disease: Secondary | ICD-10-CM | POA: Diagnosis not present

## 2018-06-02 NOTE — Telephone Encounter (Signed)
Pamela Collier, from [place] returned call and verbal orders given to her for OT  1 x 4 pr Dr. Derrel Nip.

## 2018-06-02 NOTE — Assessment & Plan Note (Signed)
suboptimal control complicated by patient's recurrent hypoglycemia caused by delaying of meals and lack of understanding about the rapid effect of mealtime mixed insulin.  She 30 units twice daily   Lab Results  Component Value Date   HGBA1C 7.7 (H) 02/10/2018

## 2018-06-02 NOTE — Assessment & Plan Note (Signed)
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

## 2018-06-02 NOTE — Patient Outreach (Signed)
Transition of care:  Covering for Landis Martins RN  Placed call to patient who answered and reports that she is doing well. Reports home health nurse with her right now. States MD appointment went well yesterday.  Reports she lost her CBG meter and her husband has gone to get her a new meter.  Patient declined any other needs at this time. Requested a [facility] calendar.  PLAN: Placed call to Arville Care, [facility] assistant, who will mail out calendar. Will plan to follow up with patient in 1 week.  [name], RN, BSN, CEN [facility] [employer] [phone]

## 2018-06-02 NOTE — Assessment & Plan Note (Signed)
Very early . 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  [place]

## 2018-06-02 NOTE — Telephone Encounter (Signed)
Verbal orders given  

## 2018-06-02 NOTE — Telephone Encounter (Signed)
Copied from [place] [id number]. Topic: General - Other >> Jun 02, 2018  2:31 PM Percell Belt A wrote: Reason for CRM:   Glenard Haring with [facility] stated that pt has misplaced her glucometer.  Pt is need a new script called in to pharmacy for a Glucometer and strips.  Accu check with the one she was using  [place], [place] - [place] [phone] (Phone) [phone] (Fax

## 2018-06-03 DIAGNOSIS — I5032 Chronic diastolic (congestive) heart failure: Secondary | ICD-10-CM | POA: Diagnosis not present

## 2018-06-03 DIAGNOSIS — J449 Chronic obstructive pulmonary disease, unspecified: Secondary | ICD-10-CM | POA: Diagnosis not present

## 2018-06-03 DIAGNOSIS — D631 Anemia in chronic kidney disease: Secondary | ICD-10-CM | POA: Diagnosis not present

## 2018-06-03 DIAGNOSIS — I130 Hypertensive heart and chronic kidney disease with heart failure and stage 1 through stage 4 chronic kidney disease, or unspecified chronic kidney disease: Secondary | ICD-10-CM | POA: Diagnosis not present

## 2018-06-03 DIAGNOSIS — N184 Chronic kidney disease, stage 4 (severe): Secondary | ICD-10-CM | POA: Diagnosis not present

## 2018-06-03 DIAGNOSIS — E1122 Type 2 diabetes mellitus with diabetic chronic kidney disease: Secondary | ICD-10-CM | POA: Diagnosis not present

## 2018-06-03 MED ORDER — BLOOD GLUCOSE METER KIT: PACK | 0 refills | Status: AC

## 2018-06-03 NOTE — Telephone Encounter (Signed)
Refill has been sent in [employer].

## 2018-06-04 DIAGNOSIS — E1122 Type 2 diabetes mellitus with diabetic chronic kidney disease: Secondary | ICD-10-CM | POA: Diagnosis not present

## 2018-06-04 DIAGNOSIS — J449 Chronic obstructive pulmonary disease, unspecified: Secondary | ICD-10-CM | POA: Diagnosis not present

## 2018-06-04 DIAGNOSIS — I5032 Chronic diastolic (congestive) heart failure: Secondary | ICD-10-CM | POA: Diagnosis not present

## 2018-06-04 DIAGNOSIS — N184 Chronic kidney disease, stage 4 (severe): Secondary | ICD-10-CM | POA: Diagnosis not present

## 2018-06-04 DIAGNOSIS — I130 Hypertensive heart and chronic kidney disease with heart failure and stage 1 through stage 4 chronic kidney disease, or unspecified chronic kidney disease: Secondary | ICD-10-CM | POA: Diagnosis not present

## 2018-06-04 DIAGNOSIS — D631 Anemia in chronic kidney disease: Secondary | ICD-10-CM | POA: Diagnosis not present

## 2018-06-05 ENCOUNTER — Inpatient Hospital Stay
Admission: EM | Admit: 2018-06-05 | Discharge: 2018-06-08 | DRG: 291 | Disposition: A | Discharge status: Skilled Nursing Facility | Payer: Medicare Other | Attending: Internal Medicine | Admitting: Internal Medicine

## 2018-06-05 ENCOUNTER — Emergency Department: Payer: Medicare Other

## 2018-06-05 ENCOUNTER — Inpatient Hospital Stay: Payer: Medicare Other

## 2018-06-05 ENCOUNTER — Encounter: Payer: Self-pay | Admitting: Emergency Medicine

## 2018-06-05 DIAGNOSIS — E1122 Type 2 diabetes mellitus with diabetic chronic kidney disease: Secondary | ICD-10-CM | POA: Diagnosis present

## 2018-06-05 DIAGNOSIS — G4733 Obstructive sleep apnea (adult) (pediatric): Secondary | ICD-10-CM

## 2018-06-05 DIAGNOSIS — J189 Pneumonia, unspecified organism: Secondary | ICD-10-CM

## 2018-06-05 DIAGNOSIS — Y95 Nosocomial condition: Secondary | ICD-10-CM | POA: Diagnosis present

## 2018-06-05 DIAGNOSIS — R278 Other lack of coordination: Secondary | ICD-10-CM | POA: Diagnosis not present

## 2018-06-05 DIAGNOSIS — E1140 Type 2 diabetes mellitus with diabetic neuropathy, unspecified: Secondary | ICD-10-CM | POA: Diagnosis not present

## 2018-06-05 DIAGNOSIS — I5033 Acute on chronic diastolic (congestive) heart failure: Secondary | ICD-10-CM | POA: Diagnosis not present

## 2018-06-05 DIAGNOSIS — I2510 Atherosclerotic heart disease of native coronary artery without angina pectoris: Secondary | ICD-10-CM | POA: Diagnosis present

## 2018-06-05 DIAGNOSIS — E1129 Type 2 diabetes mellitus with other diabetic kidney complication: Secondary | ICD-10-CM | POA: Diagnosis present

## 2018-06-05 DIAGNOSIS — I10 Essential (primary) hypertension: Secondary | ICD-10-CM | POA: Diagnosis not present

## 2018-06-05 DIAGNOSIS — Z825 Family history of asthma and other chronic lower respiratory diseases: Secondary | ICD-10-CM | POA: Diagnosis not present

## 2018-06-05 DIAGNOSIS — M6281 Muscle weakness (generalized): Secondary | ICD-10-CM | POA: Diagnosis not present

## 2018-06-05 DIAGNOSIS — G250 Essential tremor: Secondary | ICD-10-CM | POA: Diagnosis present

## 2018-06-05 DIAGNOSIS — R52 Pain, unspecified: Secondary | ICD-10-CM | POA: Diagnosis not present

## 2018-06-05 DIAGNOSIS — D860 Sarcoidosis of lung: Secondary | ICD-10-CM | POA: Diagnosis present

## 2018-06-05 DIAGNOSIS — E785 Hyperlipidemia, unspecified: Secondary | ICD-10-CM | POA: Diagnosis present

## 2018-06-05 DIAGNOSIS — Z87442 Personal history of urinary calculi: Secondary | ICD-10-CM

## 2018-06-05 DIAGNOSIS — N183 Chronic kidney disease, stage 3 unspecified: Secondary | ICD-10-CM | POA: Diagnosis present

## 2018-06-05 DIAGNOSIS — J9601 Acute respiratory failure with hypoxia: Secondary | ICD-10-CM | POA: Diagnosis present

## 2018-06-05 DIAGNOSIS — K760 Fatty (change of) liver, not elsewhere classified: Secondary | ICD-10-CM | POA: Diagnosis present

## 2018-06-05 DIAGNOSIS — Z79899 Other long term (current) drug therapy: Secondary | ICD-10-CM

## 2018-06-05 DIAGNOSIS — Z9049 Acquired absence of other specified parts of digestive tract: Secondary | ICD-10-CM | POA: Diagnosis not present

## 2018-06-05 DIAGNOSIS — J810 Acute pulmonary edema: Secondary | ICD-10-CM | POA: Diagnosis not present

## 2018-06-05 DIAGNOSIS — J9600 Acute respiratory failure, unspecified whether with hypoxia or hypercapnia: Secondary | ICD-10-CM | POA: Diagnosis not present

## 2018-06-05 DIAGNOSIS — Z90710 Acquired absence of both cervix and uterus: Secondary | ICD-10-CM | POA: Diagnosis not present

## 2018-06-05 DIAGNOSIS — Z881 Allergy status to other antibiotic agents status: Secondary | ICD-10-CM

## 2018-06-05 DIAGNOSIS — Z8673 Personal history of transient ischemic attack (TIA), and cerebral infarction without residual deficits: Secondary | ICD-10-CM

## 2018-06-05 DIAGNOSIS — J90 Pleural effusion, not elsewhere classified: Secondary | ICD-10-CM | POA: Diagnosis not present

## 2018-06-05 DIAGNOSIS — Z882 Allergy status to sulfonamides status: Secondary | ICD-10-CM

## 2018-06-05 DIAGNOSIS — Z955 Presence of coronary angioplasty implant and graft: Secondary | ICD-10-CM

## 2018-06-05 DIAGNOSIS — R51 Headache: Secondary | ICD-10-CM | POA: Diagnosis not present

## 2018-06-05 DIAGNOSIS — Z794 Long term (current) use of insulin: Secondary | ICD-10-CM

## 2018-06-05 DIAGNOSIS — Z8249 Family history of ischemic heart disease and other diseases of the circulatory system: Secondary | ICD-10-CM | POA: Diagnosis not present

## 2018-06-05 DIAGNOSIS — I255 Ischemic cardiomyopathy: Secondary | ICD-10-CM | POA: Diagnosis present

## 2018-06-05 DIAGNOSIS — R0902 Hypoxemia: Secondary | ICD-10-CM | POA: Diagnosis not present

## 2018-06-05 DIAGNOSIS — Z9889 Other specified postprocedural states: Secondary | ICD-10-CM

## 2018-06-05 DIAGNOSIS — Z91041 Radiographic dye allergy status: Secondary | ICD-10-CM

## 2018-06-05 DIAGNOSIS — J9621 Acute and chronic respiratory failure with hypoxia: Secondary | ICD-10-CM | POA: Diagnosis not present

## 2018-06-05 DIAGNOSIS — I4510 Unspecified right bundle-branch block: Secondary | ICD-10-CM | POA: Diagnosis not present

## 2018-06-05 DIAGNOSIS — E1151 Type 2 diabetes mellitus with diabetic peripheral angiopathy without gangrene: Secondary | ICD-10-CM | POA: Diagnosis present

## 2018-06-05 DIAGNOSIS — M810 Age-related osteoporosis without current pathological fracture: Secondary | ICD-10-CM | POA: Diagnosis present

## 2018-06-05 DIAGNOSIS — E1165 Type 2 diabetes mellitus with hyperglycemia: Secondary | ICD-10-CM

## 2018-06-05 DIAGNOSIS — Z7952 Long term (current) use of systemic steroids: Secondary | ICD-10-CM

## 2018-06-05 DIAGNOSIS — Z803 Family history of malignant neoplasm of breast: Secondary | ICD-10-CM

## 2018-06-05 DIAGNOSIS — I130 Hypertensive heart and chronic kidney disease with heart failure and stage 1 through stage 4 chronic kidney disease, or unspecified chronic kidney disease: Principal | ICD-10-CM | POA: Diagnosis present

## 2018-06-05 DIAGNOSIS — J440 Chronic obstructive pulmonary disease with acute lower respiratory infection: Secondary | ICD-10-CM | POA: Diagnosis present

## 2018-06-05 DIAGNOSIS — IMO0002 Reserved for concepts with insufficient information to code with codable children: Secondary | ICD-10-CM | POA: Diagnosis present

## 2018-06-05 DIAGNOSIS — E782 Mixed hyperlipidemia: Secondary | ICD-10-CM | POA: Diagnosis present

## 2018-06-05 DIAGNOSIS — Z7982 Long term (current) use of aspirin: Secondary | ICD-10-CM

## 2018-06-05 DIAGNOSIS — I509 Heart failure, unspecified: Secondary | ICD-10-CM | POA: Diagnosis not present

## 2018-06-05 DIAGNOSIS — Z7951 Long term (current) use of inhaled steroids: Secondary | ICD-10-CM

## 2018-06-05 DIAGNOSIS — Z888 Allergy status to other drugs, medicaments and biological substances status: Secondary | ICD-10-CM

## 2018-06-05 DIAGNOSIS — J918 Pleural effusion in other conditions classified elsewhere: Secondary | ICD-10-CM | POA: Diagnosis present

## 2018-06-05 DIAGNOSIS — R069 Unspecified abnormalities of breathing: Secondary | ICD-10-CM | POA: Diagnosis not present

## 2018-06-05 DIAGNOSIS — E876 Hypokalemia: Secondary | ICD-10-CM | POA: Diagnosis not present

## 2018-06-05 DIAGNOSIS — Z7902 Long term (current) use of antithrombotics/antiplatelets: Secondary | ICD-10-CM

## 2018-06-05 DIAGNOSIS — R0602 Shortness of breath: Secondary | ICD-10-CM | POA: Diagnosis not present

## 2018-06-05 DIAGNOSIS — Z89421 Acquired absence of other right toe(s): Secondary | ICD-10-CM

## 2018-06-05 DIAGNOSIS — I248 Other forms of acute ischemic heart disease: Secondary | ICD-10-CM | POA: Diagnosis present

## 2018-06-05 DIAGNOSIS — R079 Chest pain, unspecified: Secondary | ICD-10-CM | POA: Diagnosis not present

## 2018-06-05 DIAGNOSIS — I25118 Atherosclerotic heart disease of native coronary artery with other forms of angina pectoris: Secondary | ICD-10-CM | POA: Diagnosis not present

## 2018-06-05 DIAGNOSIS — Z9989 Dependence on other enabling machines and devices: Secondary | ICD-10-CM

## 2018-06-05 LAB — BASIC METABOLIC PANEL
ANION GAP: 7 (ref 5–15)
BUN: 14 mg/dL (ref 8–23)
CALCIUM: 8.3 mg/dL — AB (ref 8.9–10.3)
CO2: 26 mmol/L (ref 22–32)
Chloride: 106 mmol/L (ref 98–111)
Creatinine, Ser: 1.33 mg/dL — ABNORMAL HIGH (ref 0.44–1.00)
GFR calc Af Amer: 44 mL/min — ABNORMAL LOW (ref >60)
GFR calc non Af Amer: 38 mL/min — ABNORMAL LOW (ref >60)
Glucose, Bld: 149 mg/dL — ABNORMAL HIGH (ref 70–99)
Potassium: 3.4 mmol/L — ABNORMAL LOW (ref 3.5–5.1)
Sodium: 139 mmol/L (ref 135–145)

## 2018-06-05 LAB — CBC
HCT: 32 % — ABNORMAL LOW (ref 36.0–46.0)
Hemoglobin: 9.9 g/dL — ABNORMAL LOW (ref 12.0–15.0)
MCH: 28.4 pg (ref 26.0–34.0)
MCHC: 30.9 g/dL (ref 30.0–36.0)
MCV: 92 fL (ref 80.0–100.0)
Platelets: 359 10*3/uL (ref 150–400)
RBC: 3.48 MIL/uL — AB (ref 3.87–5.11)
RDW: 16.3 % — ABNORMAL HIGH (ref 11.5–15.5)
WBC: 10.4 10*3/uL (ref 4.0–10.5)
nRBC: 0 % (ref 0.0–0.2)

## 2018-06-05 LAB — PROCALCITONIN

## 2018-06-05 LAB — TROPONIN I: TROPONIN I: 0.06 ng/mL — AB (ref <0.03)

## 2018-06-05 MED ORDER — DONEPEZIL HCL 5 MG PO TABS
10.0000 mg | ORAL_TABLET | Freq: Every day | ORAL | Status: DC
  Administered 2018-06-05 – 2018-06-07 (×3): 10 mg via ORAL
  Filled 2018-06-05 (×5): qty 2

## 2018-06-05 MED ORDER — HYDRALAZINE HCL 50 MG PO TABS
100.0000 mg | ORAL_TABLET | Freq: Three times a day (TID) | ORAL | Status: DC
  Administered 2018-06-05 – 2018-06-08 (×8): 100 mg via ORAL
  Filled 2018-06-05 (×9): qty 2

## 2018-06-05 MED ORDER — SODIUM CHLORIDE 0.9 % IV SOLN
2.0000 g | Freq: Once | INTRAVENOUS | Status: DC
  Filled 2018-06-05: qty 2

## 2018-06-05 MED ORDER — ALPRAZOLAM 0.25 MG PO TABS
0.2500 mg | ORAL_TABLET | Freq: Every day | ORAL | Status: DC
  Administered 2018-06-06 – 2018-06-08 (×3): 0.25 mg via ORAL
  Filled 2018-06-05 (×3): qty 1

## 2018-06-05 MED ORDER — AMLODIPINE BESYLATE 10 MG PO TABS
10.0000 mg | ORAL_TABLET | Freq: Every day | ORAL | Status: DC
  Administered 2018-06-06 – 2018-06-08 (×3): 10 mg via ORAL
  Filled 2018-06-05 (×3): qty 1

## 2018-06-05 MED ORDER — VANCOMYCIN HCL IN DEXTROSE 1-5 GM/200ML-% IV SOLN
1000.0000 mg | Freq: Once | INTRAVENOUS | Status: AC
  Administered 2018-06-05: 1000 mg via INTRAVENOUS
  Filled 2018-06-05: qty 200

## 2018-06-05 MED ORDER — IPRATROPIUM-ALBUTEROL 0.5-2.5 (3) MG/3ML IN SOLN: 3.0000 mL | RESPIRATORY_TRACT | Status: DC | PRN

## 2018-06-05 MED ORDER — FUROSEMIDE 20 MG PO TABS: 20.0000 mg | ORAL_TABLET | ORAL | Status: DC

## 2018-06-05 MED ORDER — SODIUM CHLORIDE 0.9 % IV SOLN
1.0000 g | Freq: Two times a day (BID) | INTRAVENOUS | Status: DC
  Administered 2018-06-05 – 2018-06-06 (×2): 1 g via INTRAVENOUS
  Filled 2018-06-05 (×3): qty 1

## 2018-06-05 MED ORDER — TICAGRELOR 60 MG PO TABS
60.0000 mg | ORAL_TABLET | Freq: Two times a day (BID) | ORAL | Status: DC
  Administered 2018-06-05 – 2018-06-08 (×6): 60 mg via ORAL
  Filled 2018-06-05 (×10): qty 1

## 2018-06-05 MED ORDER — POTASSIUM CHLORIDE CRYS ER 20 MEQ PO TBCR
60.0000 meq | EXTENDED_RELEASE_TABLET | Freq: Once | ORAL | Status: AC
  Administered 2018-06-06: 60 meq via ORAL
  Filled 2018-06-05: qty 3

## 2018-06-05 MED ORDER — GUAIFENESIN-DM 100-10 MG/5ML PO SYRP
5.0000 mL | ORAL_SOLUTION | ORAL | Status: DC | PRN
  Administered 2018-06-07: 5 mL via ORAL
  Filled 2018-06-05: qty 5

## 2018-06-05 MED ORDER — ACETAMINOPHEN 325 MG PO TABS
650.0000 mg | ORAL_TABLET | Freq: Four times a day (QID) | ORAL | Status: DC | PRN
  Administered 2018-06-05 – 2018-06-06 (×3): 650 mg via ORAL
  Filled 2018-06-05 (×3): qty 2

## 2018-06-05 MED ORDER — ENOXAPARIN SODIUM 40 MG/0.4ML ~~LOC~~ SOLN
40.0000 mg | SUBCUTANEOUS | Status: DC
  Administered 2018-06-06 – 2018-06-07 (×2): 40 mg via SUBCUTANEOUS
  Filled 2018-06-05 (×2): qty 0.4

## 2018-06-05 MED ORDER — ROSUVASTATIN CALCIUM 10 MG PO TABS
20.0000 mg | ORAL_TABLET | Freq: Every day | ORAL | Status: DC
  Administered 2018-06-06 – 2018-06-08 (×3): 20 mg via ORAL
  Filled 2018-06-05 (×3): qty 2

## 2018-06-05 MED ORDER — VANCOMYCIN HCL IN DEXTROSE 750-5 MG/150ML-% IV SOLN
750.0000 mg | Freq: Once | INTRAVENOUS | Status: AC
  Administered 2018-06-06: 750 mg via INTRAVENOUS
  Filled 2018-06-05: qty 150

## 2018-06-05 MED ORDER — ACETAMINOPHEN 650 MG RE SUPP: 650.0000 mg | Freq: Four times a day (QID) | RECTAL | Status: DC | PRN

## 2018-06-05 MED ORDER — INSULIN ASPART 100 UNIT/ML ~~LOC~~ SOLN
0.0000 [IU] | Freq: Every day | SUBCUTANEOUS | Status: DC
  Filled 2018-06-05: qty 1

## 2018-06-05 MED ORDER — VANCOMYCIN HCL IN DEXTROSE 1-5 GM/200ML-% IV SOLN
1000.0000 mg | INTRAVENOUS | Status: DC
  Filled 2018-06-05: qty 200

## 2018-06-05 MED ORDER — ASPIRIN EC 81 MG PO TBEC
81.0000 mg | DELAYED_RELEASE_TABLET | Freq: Every day | ORAL | Status: DC
  Administered 2018-06-06 – 2018-06-08 (×3): 81 mg via ORAL
  Filled 2018-06-05 (×3): qty 1

## 2018-06-05 MED ORDER — FUROSEMIDE 10 MG/ML IJ SOLN
40.0000 mg | Freq: Once | INTRAMUSCULAR | Status: AC
  Administered 2018-06-05: 40 mg via INTRAVENOUS
  Filled 2018-06-05: qty 4

## 2018-06-05 MED ORDER — ISOSORBIDE MONONITRATE ER 30 MG PO TB24
30.0000 mg | ORAL_TABLET | Freq: Every day | ORAL | Status: DC
  Administered 2018-06-06 – 2018-06-08 (×3): 30 mg via ORAL
  Filled 2018-06-05 (×3): qty 1

## 2018-06-05 MED ORDER — ONDANSETRON HCL 4 MG PO TABS: 4.0000 mg | ORAL_TABLET | Freq: Four times a day (QID) | ORAL | Status: DC | PRN

## 2018-06-05 MED ORDER — INSULIN ASPART 100 UNIT/ML ~~LOC~~ SOLN
0.0000 [IU] | Freq: Three times a day (TID) | SUBCUTANEOUS | Status: DC
  Administered 2018-06-06 (×2): 2 [IU] via SUBCUTANEOUS
  Administered 2018-06-07: 3 [IU] via SUBCUTANEOUS
  Administered 2018-06-07 (×2): 2 [IU] via SUBCUTANEOUS
  Administered 2018-06-08: 1 [IU] via SUBCUTANEOUS
  Administered 2018-06-08: 3 [IU] via SUBCUTANEOUS
  Filled 2018-06-05 (×7): qty 1

## 2018-06-05 MED ORDER — BENZONATATE 100 MG PO CAPS: 200.0000 mg | ORAL_CAPSULE | Freq: Three times a day (TID) | ORAL | Status: DC | PRN

## 2018-06-05 MED ORDER — ONDANSETRON HCL 4 MG/2ML IJ SOLN
4.0000 mg | Freq: Four times a day (QID) | INTRAMUSCULAR | Status: DC | PRN
  Administered 2018-06-07: 4 mg via INTRAVENOUS
  Filled 2018-06-05: qty 2

## 2018-06-05 NOTE — H&P (Signed)
[facility] [name]    MR#:  [id number]  DATE OF BIRTH:  1940-09-21  DATE OF ADMISSION:  06/05/2018  PRIMARY CARE PHYSICIAN: [name], MD   REQUESTING/REFERRING PHYSICIAN: Joni Fears, MD  CHIEF COMPLAINT:   Chief Complaint  Patient presents with  . Shortness of Breath    HISTORY OF PRESENT ILLNESS:  Pamela Collier  is a 78 y.o. female who presents with chief complaint as above.  Patient presents to the ED with acute onset shortness of breath.  She has a history of heart failure, and imaging here in the ED tonight is consistent with pulmonary edema.  She had some hypoxia and required BiPAP in the ED.  She was initially treated for pneumonia as well as a chest x-ray was read to have some consolidation.  Hospitalist were called for admission  PAST MEDICAL HISTORY:   Past Medical History:  Diagnosis Date  . Acute respiratory failure ([place]) 11/21/2014  . Anxiety   . Arthritis 08/05/2013  . Atherosclerotic peripheral vascular disease with gangrene [facility]) august 2012  . Cardiomyopathy, ischemic    a. EF 35 to 40% by echo in 2013 b. EF improved to 50-55% by echo in 04/2015; 11/2016 Echo: EF 55-60%, Gr1 DD, mildly dil LA, nl RV fxn.  . Cerebral infarct ([place]) 08/17/2013  . Chronic diastolic CHF (congestive heart failure) (HCC)    a. EF 50-55% by echo in 04/2015; b. 11/2016 Echo: EF 55-60%, Gr1 DD.  [name] COPD (chronic obstructive pulmonary disease) ([place])   . Coronary artery disease, occlusive    a. Previous PCI to the LAD, LCx, and RCA in 2010, 2011, 2013, and 2016. All performed in [place]; b. 11/2016 Lexiscan MV: EF 43%, no ischemia->low risk.  . 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 Diabetic diarrhea ([place]) 10/03/2014  . DM type 2, uncontrolled, with renal complications ([place]) 10/14/7901  . Hepatic steatosis    by CT abd pelvis  . History of kidney stones    at a younger age  . Hyperlipidemia LDL goal <100 02/23/2014  .  Hypertensive heart disease   . Osteoporosis, post-menopausal   . 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 Pill esophagitis   . Pleural effusion 10/25/2012   10/2012 CT chest >> small to moderate R lung effusion>> chylothorax, 100% lymphs 10/2013 thoracentesis> cytology negative, WBC 1471, > 90% "small lymphs" 01/2014 CT chest> near complete resolution of pleural effusion, stable lymphadenopathy 08/2014 CT chest [place] ([facility]): small right sided effusion decreased in size, stable mediastinal lymphadenopathy 1.0cm largest, 87m . 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 Sleep apnea   . Stage III chronic kidney disease ([place]      PAST SURGICAL HISTORY:   Past Surgical History:  Procedure Laterality Date  . ABDOMINAL HYSTERECTOMY     at ge 39. secondary to bleeding/partial  . ANTERIOR CERVICAL DECOMP/DISCECTOMY FUSION N/A 10/10/2016   Procedure: Anterior Cervical Discectomy Fusion - Cervical four4- five - Cervical five-Cervical six - Cervical six-Cervical seven;  Surgeon: PEarnie Larsson MD;  [facility]  Service: Neurosurgery;  Laterality:  N/A;  . BREAST CYST ASPIRATION Right   . CARPAL TUNNEL RELEASE Bilateral   . CHOLECYSTECTOMY     in [place]   . COLONOSCOPY WITH PROPOFOL N/A 01/09/2015   Procedure: COLONOSCOPY WITH PROPOFOL;  Surgeon: Lucilla Lame, MD;  Location: [facility] ENDOSCOPY;  Service: Endoscopy;  Laterality: N/A;  . CORONARY ANGIOPLASTY WITH STENT PLACEMENT     [place]; [facility]  . EYE SURGERY     bil cataracts  . HERNIA REPAIR     umbilical/ Dr Pat Patrick  . OOPHORECTOMY    . PTCA  August 2012   Right Posterior tibial  artery , [place]  . TOE AMPUTATION  Sept 2012   Right 2nd toe, [place]     SOCIAL HISTORY:   Social History   Tobacco Use  . Smoking status: Never Smoker  . Smokeless tobacco: Never Used  Substance Use Topics  . Alcohol use: No    Alcohol/week: 0.0 standard drinks     FAMILY HISTORY:   Family History  Problem Relation Age of Onset  . Heart disease Father   . Heart attack Father   . Heart disease Sister   . Heart attack Sister   . Heart disease Brother   . Heart attack Brother   . Tremor Mother   . Asthma Grandchild   . Breast cancer Maternal Aunt      DRUG ALLERGIES:   Allergies  Allergen Reactions  . Cephalexin Itching  . Contrast Media [Iodinated Diagnostic Agents] Itching  . Doxycycline Itching  . Gabapentin Itching  . Sulfa Antibiotics Itching    MEDICATIONS AT HOME:   Prior to Admission medications   Medication Sig Start Date End Date Taking? Authorizing Provider  acetaminophen (TYLENOL) 500 MG tablet Take 1,000 mg by mouth every 6 (six) hours as needed for mild pain or headache.     [provider]  allopurinol (ZYLOPRIM) 100 MG tablet Take 1 tablet (100 mg total) by mouth daily. 03/22/18 03/22/19  Loletha Grayer, MD  ALPRAZolam [name]) 0.25 MG tablet Take 1 tablet (0.25 mg total) by mouth daily. 04/30/18   Henreitta Leber, MD  amLODipine (NORVASC) 5 MG tablet Take 2 tablets (10 mg total) by mouth daily. 04/30/18   Henreitta Leber, MD  aspirin EC 81 MG tablet Take 81 mg by mouth daily.    [provider]  B-D UF III MINI PEN NEEDLES 31G X 5 MM MISC USE THREE TIMES A DAY 02/18/18   [name], MD  blood glucose meter kit and supplies Dispense based on patient and insurance preference. Use up to three times daily as directed. (FOR ICD-10 E11.21) 06/03/18   [name], MD  dicyclomine (BENTYL) 10 MG capsule Take 1 capsule (10 mg total) by mouth 4 (four) times daily -  before meals and at bedtime. 03/10/18   [name], MD   diphenoxylate-atropine (LOMOTIL) 2.5-0.025 MG tablet Take 1 tablet by mouth 4 (four) times daily as needed for diarrhea or loose stools. 03/22/18   Loletha Grayer, MD  donepezil (ARICEPT) 10 MG tablet Take 10 mg by mouth at bedtime.    [provider]  feeding supplement, GLUCERNA SHAKE, (GLUCERNA SHAKE) LIQD Take 237 mLs by mouth 2 (two) times daily between meals. 05/27/17   Gouru, Illene Silver, MD  fluticasone (FLONASE) 50 MCG/ACT nasal spray Place 2 sprays into both nostrils daily as needed for rhinitis. 01/20/18   [name], MD  furosemide (LASIX) 20 MG tablet  Take 1 tablet (20 mg ) once daily on Monday, Wednesday and Friday. 05/17/18   Dunn, Areta Haber, PA-C  glucose blood (ACCU-CHEK AVIVA PLUS) test strip USE TO CHECK GLUCOSE THREE TIMES DAILY 02/16/18   [name], MD  glucose blood test strip Use to check blood sugars 2 times daily 02/16/18   [name], MD  guaiFENesin (MUCINEX) 600 MG 12 hr tablet Take 600 mg by mouth 2 (two) times daily.    [provider]  guaiFENesin-dextromethorphan (ROBITUSSIN DM) 100-10 MG/5ML syrup Take 5 mLs by mouth every 4 (four) hours as needed for cough. 04/30/18   Henreitta Leber, MD  hydrALAZINE (APRESOLINE) 100 MG tablet Take 1 tablet (100 mg total) by mouth every 8 (eight) hours. 04/30/18   Henreitta Leber, MD  Infant Care Products [facility]) CREA Apply frequent to sacral ulcer 06/01/18   [name], MD  insulin aspart protamine- aspart (NOVOLOG MIX 70/30) (70-30) 100 UNIT/ML injection Inject 0.3 mLs (30 Units total) into the skin 2 (two) times daily with a meal. 03/22/18   Loletha Grayer, MD  isosorbide mononitrate (IMDUR) 30 MG 24 hr tablet Take 1 tablet (30 mg total) by mouth daily. 03/03/18   Minna Merritts, MD  mometasone-formoterol (DULERA) 200-5 MCG/ACT AERO Inhale 2 puffs into the lungs 2 (two) times daily. 02/01/18   [name], MD  nitroGLYCERIN (NITROLINGUAL) 0.4 MG/SPRAY spray Place 1 spray under  the tongue every 5 (five) minutes x 3 doses as needed for chest pain. 01/14/17   [name], MD  ondansetron (ZOFRAN) 4 MG tablet Take 4 mg by mouth every 8 (eight) hours as needed for nausea or vomiting.    [provider]  predniSONE (DELTASONE) 10 MG tablet Take 10 mg by mouth daily with breakfast.    [provider]  pregabalin (LYRICA) 100 MG capsule Take 100 mg by mouth 2 (two) times daily.     [provider]  propranolol (INDERAL) 10 MG tablet Take 10 mg by mouth daily.    [provider]  rosuvastatin (CRESTOR) 20 MG tablet Take 1 tablet (20 mg total) by mouth daily. 04/16/18   [name], MD  senna (SENOKOT) 8.6 MG tablet Take 1 tablet by mouth 2 (two) times daily as needed for constipation.    [provider]  ticagrelor (BRILINTA) 60 MG TABS tablet Take 1 tablet (60 mg total) by mouth 2 (two) times daily. 04/22/18   Minna Merritts, MD    REVIEW OF SYSTEMS:  Review of Systems  Constitutional: Negative for chills, fever, malaise/fatigue and weight loss.  HENT: Negative for ear pain, hearing loss and tinnitus.   Eyes: Negative for blurred vision, double vision, pain and redness.  Respiratory: Positive for cough and shortness of breath. Negative for hemoptysis.   Cardiovascular: Positive for leg swelling. Negative for chest pain, palpitations and orthopnea.  Gastrointestinal: Negative for abdominal pain, constipation, diarrhea, nausea and vomiting.  Genitourinary: Negative for dysuria, frequency and hematuria.  Musculoskeletal: Negative for back pain, joint pain and neck pain.  Skin:       No acne, rash, or lesions  Neurological: Negative for dizziness, tremors, focal weakness and weakness.  Endo/Heme/Allergies: Negative for polydipsia. Does not bruise/bleed easily.  Psychiatric/Behavioral: Negative for depression. The patient is not nervous/anxious and does not have insomnia.      VITAL SIGNS:   Vitals:   06/05/18 1950  06/05/18 2000 06/05/18 2001  BP:  (!) 163/66   Pulse:  88   Resp:  (!) 25   Temp:  98.2 F (36.8 C)   TempSrc:  Axillary   SpO2: 100% 100%   Weight:   85.5 kg  Height:   5' 7"  (1.702 m)   Wt Readings from Last 3 Encounters:  06/05/18 85.5 kg  06/01/18 85.5 kg  05/25/18 82.6 kg    PHYSICAL EXAMINATION:  Physical Exam  Vitals reviewed. Constitutional: She is oriented to person, place, and time. She appears well-developed and well-nourished. No distress.  HENT:  Head: Normocephalic and atraumatic.  Mouth/Throat: Oropharynx is clear and moist.  Eyes: Pupils are equal, round, and reactive to light. Conjunctivae and EOM are normal. No scleral icterus.  Neck: Normal range of motion. Neck supple. No JVD present. No thyromegaly present.  Cardiovascular: Normal rate, regular rhythm and intact distal pulses. Exam reveals no gallop and no friction rub.  No murmur heard. Respiratory: She is in respiratory distress. She has no wheezes. She has rales.  GI: Soft. Bowel sounds are normal. She exhibits no distension. There is no abdominal tenderness.  Musculoskeletal: Normal range of motion.        General: Edema present.     Comments: No arthritis, no gout  Lymphadenopathy:    She has no cervical adenopathy.  Neurological: She is alert and oriented to person, place, and time. No cranial nerve deficit.  No dysarthria, no aphasia  Skin: Skin is warm and dry. No rash noted. No erythema.  Psychiatric: She has a normal mood and affect. Her behavior is normal. Judgment and thought content normal.    LABORATORY PANEL:   CBC Recent Labs  Lab 06/05/18 2003  WBC 10.4  HGB 9.9*  HCT 32.0*  PLT 359   ------------------------------------------------------------------------------------------------------------------  Chemistries  Recent Labs  Lab 06/05/18 2003  NA 139  K 3.4*  CL 106  CO2 26  GLUCOSE 149*  BUN 14  CREATININE 1.33*  CALCIUM 8.3*    ------------------------------------------------------------------------------------------------------------------  Cardiac Enzymes Recent Labs  Lab 06/05/18 2003  TROPONINI 0.06*   ------------------------------------------------------------------------------------------------------------------  RADIOLOGY:  Dg Chest Portable 1 View  Result Date: 06/05/2018 CLINICAL DATA:  Shortness of Breath EXAM: PORTABLE CHEST 1 VIEW COMPARISON:  04/25/2018 FINDINGS: Cardiac shadow is mildly enlarged but stable. Increasing consolidation in the right lung base is noted. Central vascular congestion with interstitial edema is noted as well. No bony abnormality is seen. IMPRESSION: Stable changes of CHF. New right basilar consolidation. Electronically Signed   By: Inez Catalina M.D.   On: 06/05/2018 20:21    EKG:   Orders placed or performed during the hospital encounter of 06/05/18  . EKG 12-Lead  . EKG 12-Lead  . ED EKG  . ED EKG   *Note: Due to a large number of results and/or encounters for the requested time period, some results have not been displayed. A complete set of results can be found in Results Review.    IMPRESSION AND PLAN:  Principal Problem:   Acute respiratory failure with hypoxia (HCC) -related to effusions and heart failure.  Patient is requiring BiPAP.  Will admit her to stepdown.  She was given IV Lasix in the ED, continue this therapy for now. Active Problems:   Acute on chronic diastolic heart failure (HCC) -IV Lasix as above, cardiology consult   HCAP (healthcare-associated pneumonia) -IV antibiotics given, supportive treatment PRN   Essential hypertension -continue home meds   Coronary artery disease -home medications   Type 2 DM with CKD stage 3 and hypertension ([place]) -  sliding scale insulin coverage   Hyperlipidemia LDL goal <70 -Home dose antilipid   OSA on CPAP -BiPAP for now as above, then CPAP nightly   CKD (chronic kidney disease) stage 3, GFR 30-59 ml/min (HCC)  -at baseline, avoid nephrotoxins and monitor  Chart review performed and case discussed with ED provider. Labs, imaging and/or ECG reviewed by provider and discussed with patient/family. Management plans discussed with the patient and/or family.  DVT PROPHYLAXIS: SubQ lovenox   GI PROPHYLAXIS:  None  ADMISSION STATUS: Inpatient     CODE STATUS: Full Code Status History    Date Active Date Inactive Code Status Order ID Comments User Context   04/25/2018 0647 04/30/2018 2057 Full Code [id number]  Harrie Foreman, MD Inpatient   06/04/2017 0151 06/04/2017 1944 Full Code [id number]  Lance Coon, MD Inpatient   05/24/2017 0129 05/26/2017 1926 Full Code [id number]  Lance Coon, MD Inpatient   11/14/2016 2249 11/15/2016 [place] Full Code [id number]  [place], [place], DO ED   10/10/2016 1325 10/13/2016 2020 Full Code [id number]  Earnie Larsson, MD Inpatient   06/11/2016 2142 06/12/2016 1923 Full Code [id number]  [place], [place], DO ED   01/02/2016 0524 01/02/2016 0914 Full Code [id number]  Saundra Shelling, MD Inpatient   06/26/2015 0736 06/29/2015 1954 Full Code [id number]  Demetrios Loll, MD ED   04/20/2015 0118 04/22/2015 1918 Full Code [id number]  Lance Coon, MD Inpatient      TOTAL CRITICAL CARE TIME TAKING CARE OF THIS PATIENT: 50 minutes.   Ethlyn Daniels 06/05/2018, 9:48 PM  [facility]  Office  [phone]  CC: Primary care physician; [name], MD  Note:  This document was prepared using Dragon voice recognition software and may include unintentional dictation errors.

## 2018-06-05 NOTE — Progress Notes (Signed)
Pharmacy Antibiotic Note  Pamela Collier is a 78 y.o. female admitted on 06/05/2018 with pneumonia.  Pharmacy has been consulted for meropenem and vancomycin dosing.  Plan: 1. Meropenem 1 gm IV Q12H 2. Vancomycin 1000 mg IV x 1 in ED. Will give additional 750 mg IV x 1 to complete a 1750 mg load then start 1000 mg IV Q24H.   Expected AUC: 499 SCr used: 1.33 mg/dl   Height: 5\' 7"  (170.2 cm) Weight: 188 lb 9.6 oz (85.5 kg) IBW/kg (Calculated) : 61.6  Temp (24hrs), Avg:98.2 F (36.8 C), Min:98.2 F (36.8 C), Max:98.2 F (36.8 C)  Recent Labs  Lab 06/05/18 2003  WBC 10.4  CREATININE 1.33*    Estimated Creatinine Clearance: 39.2 mL/min (A) (by C-G formula based on SCr of 1.33 mg/dL (H)).    Allergies  Allergen Reactions  . Cephalexin Itching  . Contrast Media [Iodinated Diagnostic Agents] Itching  . Doxycycline Itching  . Gabapentin Itching  . Sulfa Antibiotics Itching    Antimicrobials this admission:   Dose adjustments this admission:   Microbiology results:  BCx:   UCx:    Sputum:    MRSA PCR:   Thank you for allowing pharmacy to be a part of this patient's care.  Laural Benes, PharmD, BCPS Clinical Pharmacist 06/05/2018 9:17 PM

## 2018-06-05 NOTE — Progress Notes (Addendum)
Pt was transported to CT from the ED, and then transported to CCU without incident. Total time approximately 18 mins

## 2018-06-05 NOTE — ED Triage Notes (Signed)
Patient brought in by ems from home. Patient with complaint of shortness of breath that started about 18:00. Per ems patient oxygen saturations 81% on room air. Patient placed on cpap by ems. Per ems patient with crackles throughout her lung fields. Ems vital signs 190/70, hr 92, respirations 32 and end tidal of 35. Patient given 1 SL nitro and 1.5 nitro paste in route. fsbs 147.

## 2018-06-05 NOTE — ED Provider Notes (Signed)
[facility] Emergency Department Provider Note  ____________________________________________  Time seen: Approximately 8:59 PM  I have reviewed the triage vital signs and the nursing notes.   HISTORY  Chief Complaint Shortness of Breath    HPI Pamela Collier is a 78 y.o. female with a history of ischemic cardiomyopathy, CHF, COPD who complains of acute onset of shortness of breath at 6:00 PM today, constant, worsening and severe.  No aggravating or alleviating factors.  EMS no initial oxygen saturation of 81% on room air.  They placed on CPAP, gave sublingual nitro and nitro paste.  Patient reports continued shortness of breath.  Denies chest pain or other pain complaints.  No fevers or chills or cough.      Past Medical History:  Diagnosis Date  . Acute respiratory failure ([place]) 11/21/2014  . Anxiety   . Arthritis 08/05/2013  . Atherosclerotic peripheral vascular disease with gangrene [facility]) august 2012  . Cardiomyopathy, ischemic    a. EF 35 to 40% by echo in 2013 b. EF improved to 50-55% by echo in 04/2015; 11/2016 Echo: EF 55-60%, Gr1 DD, mildly dil LA, nl RV fxn.  . Cerebral infarct ([place]) 08/17/2013  . Chronic diastolic CHF (congestive heart failure) (HCC)    a. EF 50-55% by echo in 04/2015; b. 11/2016 Echo: EF 55-60%, Gr1 DD.  [name] COPD (chronic obstructive pulmonary disease) ([place])   . Coronary artery disease, occlusive    a. Previous PCI to the LAD, LCx, and RCA in 2010, 2011, 2013, and 2016. All performed in [place]; b. 11/2016 Lexiscan MV: EF 43%, no ischemia->low risk.  . 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 Diabetic diarrhea ([place]) 10/03/2014  . DM type 2, uncontrolled, with renal complications ([place]) 07/18/1827  . Hepatic steatosis    by CT abd pelvis  . History of kidney stones    at a younger age  . Hyperlipidemia LDL goal <100 02/23/2014  . Hypertensive heart disease   . Osteoporosis, post-menopausal   . 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 Pill esophagitis   . Pleural effusion 10/25/2012   10/2012 CT chest >> small to moderate R lung effusion>> chylothorax, 100% lymphs 10/2013 thoracentesis> cytology negative, WBC 1471, > 90% "small lymphs" 01/2014 CT chest> near complete resolution of pleural effusion, stable lymphadenopathy 08/2014 CT chest [place] ([facility]): small right sided effusion decreased in size, stable mediastinal lymphadenopathy 1.0cm largest, 27m . 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 Sleep apnea   . Stage III chronic kidney disease ([facility]      Patient Active Problem List   Diagnosis Date Noted  . HCAP (healthcare-associated pneumonia) 06/05/2018  . Decubitus ulcer of sacral region, stage 1 06/02/2018  . Lymphedema 05/25/2018  . Acute on chronic diastolic heart failure ([place] 04/25/2018  . Atherosclerosis of both carotid arteries 04/18/2018  . Near syncope 03/19/2018  . Functional diarrhea 03/13/2018  . Vitamin a deficiency, unspecified 02/22/2018  . Hypercalcemia 02/20/2018  . Environmental and seasonal allergies 01/15/2018  . Post-nasal drip 01/15/2018  . Restless legs 10/17/2017  . Normocytic anemia, not due to blood loss 09/27/2017  . Melena  09/25/2017  . Cognitive decline 08/16/2017  . Polypharmacy 07/28/2017  . Pneumonia 05/26/2017  . Pill esophagitis 05/23/2017  . CKD (chronic kidney disease) stage 3, GFR 30-59 ml/min (HCC) 11/15/2016  . History of fall 11/12/2016  . Osteoarthritis 11/10/2016  . Cervical stenosis of spinal canal 10/10/2016  . Cervical radiculopathy at C7 07/02/2016  . Epigastric hernia 04/01/2016  . Coronary artery disease   . Type 2 DM with CKD  stage 3 and hypertension ([place])   . Chronic renal insufficiency   . Hypertensive heart disease   . OSA on CPAP 07/07/2015  . Hospital discharge follow-up 07/07/2015  . Pleural effusion, right   . Chronic diastolic CHF (congestive heart failure) ([place]) 04/18/2015  . Diabetic neuropathy ([place]) 02/21/2015  . Benign neoplasm of sigmoid colon   . Acute respiratory failure with hypoxia ([place]) 11/21/2014  . Low back pain with radiation 04/04/2014  . Essential hypertension 04/04/2014  . Hyperlipidemia LDL goal <70 02/23/2014  . Long-term use of high-risk medication 02/23/2014  . Other long term (current) drug therapy 02/23/2014  . Dermatophytic onychia 08/17/2013  . Stable angina ([place]) 08/17/2013  . Cerebral infarct ([place]) 08/17/2013  . Bony exostosis 08/17/2013  . Obesity, unspecified 08/17/2013  . Arthritis 08/05/2013  . Unsteadiness on feet 03/24/2013  . History of colonic polyps 02/20/2013  . Sarcoidosis ([place]) 12/07/2012  . Dysphagia 12/07/2012  . Pulmonary nodule seen on imaging study 05/20/2012  . 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 Diabetic nephropathy associated with type 2 diabetes mellitus ([place]) 02/29/2012  . Cardiomyopathy, ischemic   . Hepatic steatosis   . Osteoporosis, post-menopausal   . Peripheral vascular disease due to secondary diabetes mellitus ([place])   . Double vessel coronary artery disease 12/14/2011     Past Surgical History:  Procedure Laterality Date  . ABDOMINAL HYSTERECTOMY     at ge 39. secondary to bleeding/partial  . ANTERIOR CERVICAL DECOMP/DISCECTOMY FUSION N/A 10/10/2016   Procedure: Anterior Cervical Discectomy Fusion - Cervical four4- five - Cervical five-Cervical six - Cervical six-Cervical seven;  Surgeon: Earnie Larsson, MD;  Location: [place];  Service: Neurosurgery;  Laterality: N/A;  . BREAST CYST ASPIRATION Right   . CARPAL TUNNEL RELEASE Bilateral   . CHOLECYSTECTOMY     in [place]   . COLONOSCOPY WITH PROPOFOL N/A 01/09/2015   Procedure:  COLONOSCOPY WITH PROPOFOL;  Surgeon: Lucilla Lame, MD;  Location: [facility] ENDOSCOPY;  Service: Endoscopy;  Laterality: N/A;  . CORONARY ANGIOPLASTY WITH STENT PLACEMENT     [place]; [facility]  . EYE SURGERY     bil cataracts  . HERNIA REPAIR     umbilical/ Dr Pat Patrick  . OOPHORECTOMY    . PTCA  August 2012   Right Posterior tibial artery , [place]  . TOE AMPUTATION  Sept 2012   Right 2nd toe, [place]     Prior to Admission medications   Medication Sig Start Date End Date Taking? Authorizing Provider  acetaminophen (TYLENOL) 500 MG tablet Take 1,000 mg by mouth every 6 (six) hours as needed for mild pain or headache.     [provider]  allopurinol (ZYLOPRIM) 100 MG tablet Take 1 tablet (100 mg total) by mouth daily. 03/22/18 03/22/19  Loletha Grayer, MD  ALPRAZolam [name]) 0.25 MG tablet Take 1 tablet (0.25 mg total) by mouth daily. 04/30/18   Henreitta Leber, MD  amLODipine (NORVASC) 5 MG tablet Take 2 tablets (10 mg total) by mouth daily. 04/30/18   Abel Presto  J, MD  aspirin EC 81 MG tablet Take 81 mg by mouth daily.    [provider]  B-D UF III MINI PEN NEEDLES 31G X 5 MM MISC USE THREE TIMES A DAY 02/18/18   [name], MD  blood glucose meter kit and supplies Dispense based on patient and insurance preference. Use up to three times daily as directed. (FOR ICD-10 E11.21) 06/03/18   [name], MD  dicyclomine (BENTYL) 10 MG capsule Take 1 capsule (10 mg total) by mouth 4 (four) times daily -  before meals and at bedtime. 03/10/18   [name], MD  diphenoxylate-atropine (LOMOTIL) 2.5-0.025 MG tablet Take 1 tablet by mouth 4 (four) times daily as needed for diarrhea or loose stools. 03/22/18   Loletha Grayer, MD  donepezil (ARICEPT) 10 MG tablet Take 10 mg by mouth at bedtime.    [provider]  feeding supplement, GLUCERNA SHAKE, (GLUCERNA SHAKE) LIQD Take 237 mLs by mouth 2 (two) times daily between meals. 05/27/17    Gouru, Illene Silver, MD  fluticasone (FLONASE) 50 MCG/ACT nasal spray Place 2 sprays into both nostrils daily as needed for rhinitis. 01/20/18   [name], MD  furosemide (LASIX) 20 MG tablet Take 1 tablet (20 mg ) once daily on Monday, Wednesday and Friday. 05/17/18   Dunn, Areta Haber, PA-C  glucose blood (ACCU-CHEK AVIVA PLUS) test strip USE TO CHECK GLUCOSE THREE TIMES DAILY 02/16/18   [name], MD  glucose blood test strip Use to check blood sugars 2 times daily 02/16/18   [name], MD  guaiFENesin (MUCINEX) 600 MG 12 hr tablet Take 600 mg by mouth 2 (two) times daily.    [provider]  guaiFENesin-dextromethorphan (ROBITUSSIN DM) 100-10 MG/5ML syrup Take 5 mLs by mouth every 4 (four) hours as needed for cough. 04/30/18   Henreitta Leber, MD  hydrALAZINE (APRESOLINE) 100 MG tablet Take 1 tablet (100 mg total) by mouth every 8 (eight) hours. 04/30/18   Henreitta Leber, MD  Infant Care Products [facility]) CREA Apply frequent to sacral ulcer 06/01/18   [name], MD  insulin aspart protamine- aspart (NOVOLOG MIX 70/30) (70-30) 100 UNIT/ML injection Inject 0.3 mLs (30 Units total) into the skin 2 (two) times daily with a meal. 03/22/18   Loletha Grayer, MD  isosorbide mononitrate (IMDUR) 30 MG 24 hr tablet Take 1 tablet (30 mg total) by mouth daily. 03/03/18   Minna Merritts, MD  mometasone-formoterol (DULERA) 200-5 MCG/ACT AERO Inhale 2 puffs into the lungs 2 (two) times daily. 02/01/18   [name], MD  nitroGLYCERIN (NITROLINGUAL) 0.4 MG/SPRAY spray Place 1 spray under the tongue every 5 (five) minutes x 3 doses as needed for chest pain. 01/14/17   [name], MD  ondansetron (ZOFRAN) 4 MG tablet Take 4 mg by mouth every 8 (eight) hours as needed for nausea or vomiting.    [provider]  predniSONE (DELTASONE) 10 MG tablet Take 10 mg by mouth daily with breakfast.    [provider]  pregabalin (LYRICA) 100 MG capsule Take 100 mg  by mouth 2 (two) times daily.     [provider]  propranolol (INDERAL) 10 MG tablet Take 10 mg by mouth daily.    [provider]  rosuvastatin (CRESTOR) 20 MG tablet Take 1 tablet (20 mg total) by mouth daily. 04/16/18   [name], MD  senna (SENOKOT) 8.6 MG tablet Take 1 tablet by mouth 2 (  two) times daily as needed for constipation.    [provider]  ticagrelor (BRILINTA) 60 MG TABS tablet Take 1 tablet (60 mg total) by mouth 2 (two) times daily. 04/22/18   Minna Merritts, MD     Allergies Cephalexin; Contrast media [iodinated diagnostic agents]; Doxycycline; Gabapentin; and Sulfa antibiotics   Family History  Problem Relation Age of Onset  . Heart disease Father   . Heart attack Father   . Heart disease Sister   . Heart attack Sister   . Heart disease Brother   . Heart attack Brother   . Tremor Mother   . Asthma Grandchild   . Breast cancer Maternal Aunt     Social History Social History   Tobacco Use  . Smoking status: Never Smoker  . Smokeless tobacco: Never Used  Substance Use Topics  . Alcohol use: No    Alcohol/week: 0.0 standard drinks  . Drug use: No    Review of Systems  Constitutional:   No fever or chills.  ENT:   No sore throat. No rhinorrhea. Cardiovascular:   No chest pain or syncope. Respiratory:   Positive shortness of breath without cough. Gastrointestinal:   Negative for abdominal pain, vomiting and diarrhea.  Musculoskeletal:   Negative for focal pain or swelling All other systems reviewed and are negative except as documented above in ROS and HPI.  ____________________________________________   PHYSICAL EXAM:  VITAL SIGNS: ED Triage Vitals  Enc Vitals Group     BP 06/05/18 2000 (!) 163/66     Pulse Rate 06/05/18 2000 88     Resp 06/05/18 2000 (!) 25     Temp 06/05/18 2000 98.2 F (36.8 C)     Temp Source 06/05/18 2000 Axillary     SpO2 06/05/18 1950 100 %     Weight 06/05/18 2001 188 lb 9.6 oz  (85.5 kg)     Height 06/05/18 2001 5' 7"  (1.702 m)     Head Circumference --      Peak Flow --      Pain Score 06/05/18 2000 0     Pain Loc --      Pain Edu? --      Excl. in [place]? --     Vital signs reviewed, nursing assessments reviewed.   Constitutional:   Alert and oriented.  Ill-appearing, respiratory distress. Eyes:   Conjunctivae are normal. EOMI. PERRL. ENT      Head:   Normocephalic and atraumatic.      Nose:   No congestion/rhinnorhea.       Mouth/Throat:   MMM, no pharyngeal erythema. No peritonsillar mass.       Neck:   No meningismus. Full ROM. Hematological/Lymphatic/Immunilogical:   No cervical lymphadenopathy. Cardiovascular:   RRR. Symmetric bilateral radial and DP pulses.  No murmurs. Cap refill less than 2 seconds. Respiratory:   Tachypnea and increased work of breathing.  Diminished breath sounds on the right.  Left basilar crackles. Gastrointestinal:   Soft and nontender. Non distended. There is no CVA tenderness.  No rebound, rigidity, or guarding. Musculoskeletal:   Normal range of motion in all extremities. No joint effusions.  No lower extremity tenderness.  2+ pitting edema bilateral lower extremities Neurologic:   Normal speech and language.  Motor grossly intact. No acute focal neurologic deficits are appreciated.  Skin:    Skin is warm, dry and intact. No rash noted.  No petechiae, purpura, or bullae.  ____________________________________________    LABS (pertinent positives/negatives) (all  labs ordered are listed, but only abnormal results are displayed) Labs Reviewed  BASIC METABOLIC PANEL - Abnormal; Notable for the following components:      Result Value   Potassium 3.4 (*)    Glucose, Bld 149 (*)    Creatinine, Ser 1.33 (*)    Calcium 8.3 (*)    GFR calc non Af Amer 38 (*)    GFR calc Af Amer 44 (*)    All other components within normal limits  CBC - Abnormal; Notable for the following components:   RBC 3.48 (*)    Hemoglobin 9.9 (*)     HCT 32.0 (*)    RDW 16.3 (*)    All other components within normal limits  TROPONIN I - Abnormal; Notable for the following components:   Troponin I 0.06 (*)    All other components within normal limits  MRSA PCR SCREENING  PROCALCITONIN  PROCALCITONIN   ____________________________________________   EKG  Interpreted by me Sinus rhythm rate of 88, left axis, right bundle branch block.  No acute ischemic changes.  ____________________________________________    [id number]  Dg Chest Portable 1 View  Result Date: 06/05/2018 CLINICAL DATA:  Shortness of Breath EXAM: PORTABLE CHEST 1 VIEW COMPARISON:  04/25/2018 FINDINGS: Cardiac shadow is mildly enlarged but stable. Increasing consolidation in the right lung base is noted. Central vascular congestion with interstitial edema is noted as well. No bony abnormality is seen. IMPRESSION: Stable changes of CHF. New right basilar consolidation. Electronically Signed   By: Inez Catalina M.D.   On: 06/05/2018 20:21    ____________________________________________   PROCEDURES .Critical Care Performed by: Carrie Mew, MD Authorized by: Carrie Mew, MD   Critical care provider statement:    Critical care time (minutes):  35   Critical care time was exclusive of:  Separately billable procedures and treating other patients   Critical care was necessary to treat or prevent imminent or life-threatening deterioration of the following conditions:  Respiratory failure   Critical care was time spent personally by me on the following activities:  Development of treatment plan with patient or surrogate, discussions with consultants, evaluation of patient's response to treatment, examination of patient, obtaining history from patient or surrogate, ordering and performing treatments and interventions, ordering and review of laboratory studies, ordering and review of radiographic studies, pulse oximetry, re-evaluation of patient's condition and  review of old charts    ____________________________________________  DIFFERENTIAL DIAGNOSIS   COPD exacerbation, pulmonary edema, pneumonia.  Doubt ACS PE dissection or pneumothorax.  Patient not septic.  CLINICAL IMPRESSION / ASSESSMENT AND PLAN / ED COURSE  Pertinent labs & imaging results that were available during my care of the patient were reviewed by me and considered in my medical decision making (see chart for details).      Clinical Course as of Jun 05 2118  Sat Jun 05, 2018  2006 BiPAP on arrival.  Diminished breath sounds at bases and on right side, crackles, hypertension, consistent with pulmonary edema.  IV Lasix, plan to admit for diuresis and further respiratory support.   [PS]  2019 Cxr c/w pulm edema, R side effusion   [PS]    Clinical Course User Index [PS] Carrie Mew, MD     ----------------------------------------- 9:00 PM on 06/05/2018 -----------------------------------------  Chest x-ray reads as right basilar consolidation from radiology report.  I will add on 8 Antibiotics of meropenem and vancomycin.  ____________________________________________   FINAL CLINICAL IMPRESSION(S) / ED DIAGNOSES    Final diagnoses:  Acute pulmonary edema (HCC)  Pleural effusion on right  HCAP (healthcare-associated pneumonia)  Acute on chronic respiratory failure with hypoxia [facility])     ED Discharge Orders    None      Portions of this note were generated with dragon dictation software. Dictation errors may occur despite best attempts at proofreading.   Carrie Mew, MD 06/05/18 2120

## 2018-06-05 NOTE — ED Notes (Signed)
Attempted to call report; RN unavailable at this time.   

## 2018-06-05 NOTE — Consult Note (Signed)
PULMONARY / CRITICAL CARE MEDICINE  [name] MRN: [id number] DOB: January 03, 1941    LOS: 0  Referring Provider: Dr. Jannifer Franklin Reason for Referral: Acute hypoxic respiratory failure Brief patient description: 78 year old female admitted with acute CHF exacerbation and acute hypoxic respiratory failure requiring BiPAP  HPI: This is a 78 year old female with a medical history as indicated below who presented to the ED via EMS with complaints of shortness of breath.  When EMS arrived her SPO2 was 81% on room air.  She was placed on CPAP and transferred to the emergency room.  At the ED, patient had diffuse crackles in her blood pressure was 190/70.  Her ED work-up showed a potassium level of 3.4, creatinine level of 1.33 which is unchanged from her baseline, troponin of 0.06 and her CT chest showed moderate right and small left pleural effusions with patchy airspace disease in the left upper lobe suggestive of pneumonia.  She was placed on BiPAP, given Nitropaste and Lasix and admitted to the ICU. She reports moderate improvement in dyspnea.  She denies chest pain, palpitations, nausea and vomiting.  Past Medical History:  Diagnosis Date  . Acute respiratory failure ([place]) 11/21/2014  . Anxiety   . Arthritis 08/05/2013  . Atherosclerotic peripheral vascular disease with gangrene [facility]) august 2012  . Cardiomyopathy, ischemic    a. EF 35 to 40% by echo in 2013 b. EF improved to 50-55% by echo in 04/2015; 11/2016 Echo: EF 55-60%, Gr1 DD, mildly dil LA, nl RV fxn.  . Cerebral infarct ([place]) 08/17/2013  . Chronic diastolic CHF (congestive heart failure) (HCC)    a. EF 50-55% by echo in 04/2015; b. 11/2016 Echo: EF 55-60%, Gr1 DD.  [name] COPD (chronic obstructive pulmonary disease) ([place])   . Coronary artery disease, occlusive    a. Previous PCI to the LAD, LCx, and RCA in 2010, 2011, 2013, and 2016. All performed in [place]; b. 11/2016 Lexiscan MV: EF 43%, no ischemia->low risk.  . Depression with anxiety  04/03/2012  . Diabetic diarrhea ([place]) 10/03/2014  . DM type 2, uncontrolled, with renal complications ([place]) 11/12/1636  . Hepatic steatosis    by CT abd pelvis  . History of kidney stones    at a younger age  . Hyperlipidemia LDL goal <100 02/23/2014  . Hypertensive heart disease   . Osteoporosis, post-menopausal   . 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 Pill esophagitis   . 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 Sleep apnea   . Stage III chronic kidney disease [facility])    Past Surgical History:  Procedure Laterality Date  . ABDOMINAL HYSTERECTOMY     at ge 39. secondary to bleeding/partial  . ANTERIOR CERVICAL DECOMP/DISCECTOMY FUSION N/A 10/10/2016   Procedure: Anterior Cervical Discectomy Fusion - Cervical four4-  five - Cervical five-Cervical six - Cervical six-Cervical seven;  Surgeon: Earnie Larsson, MD;  Location: [place];  Service: Neurosurgery;  Laterality: N/A;  . BREAST CYST ASPIRATION Right   . CARPAL TUNNEL RELEASE Bilateral   . CHOLECYSTECTOMY     in [place]   . COLONOSCOPY WITH PROPOFOL N/A 01/09/2015   Procedure: COLONOSCOPY WITH PROPOFOL;  Surgeon: Lucilla Lame, MD;  Location: [facility] ENDOSCOPY;  Service: Endoscopy;  Laterality: N/A;  . CORONARY  ANGIOPLASTY WITH STENT PLACEMENT     [place]; [facility]  . EYE SURGERY     bil cataracts  . HERNIA REPAIR     umbilical/ Dr Pat Patrick  . OOPHORECTOMY    . PTCA  August 2012   Right Posterior tibial artery , [place]  . TOE AMPUTATION  Sept 2012   Right 2nd toe, [place]   Prior to Admission medications   Medication Sig Start Date End Date Taking? Authorizing Provider  amLODipine (NORVASC) 5 MG tablet Take 5 mg by mouth daily.   Yes [provider]  clopidogrel (PLAVIX) 75 MG tablet Take 75 mg by mouth daily.   Yes [provider]  donepezil (ARICEPT) 5 MG tablet Take 1 tablet (5 mg total) by mouth at bedtime. 01/04/18 02/13/18 Yes Sowles, Drue Stager, MD  empagliflozin (JARDIANCE) 25 MG TABS tablet Take 25 mg by mouth daily.   Yes [provider]  glycopyrrolate (ROBINUL) 1 MG tablet Take 1 mg by mouth 2 (two) times daily.   Yes [provider]  insulin aspart (NOVOLOG FLEXPEN) 100 UNIT/ML FlexPen Inject 12 Units into the skin 2 (two) times daily.   Yes [provider]  insulin aspart (NOVOLOG) 100 UNIT/ML FlexPen Inject 18 Units into the skin daily. At 1700   Yes [provider]  Insulin Degludec-Liraglutide (XULTOPHY) 100-3.6 UNIT-MG/ML SOPN Inject 50 Units into the skin daily.   Yes [provider]  levETIRAcetam (KEPPRA) 500 MG tablet Take 500 mg by mouth 2 (two) times daily.   Yes [provider]  lipase/protease/amylase (CREON) 12000 units CPEP capsule Take 6,000 Units by mouth 3 (three) times daily before meals.   Yes [provider]  lipase/protease/amylase (CREON) 12000 units CPEP capsule Take 3,000 Units by mouth at bedtime. With snack   Yes [provider]  lisinopril (PRINIVIL,ZESTRIL) 5 MG tablet Take 5 mg by mouth daily.   Yes [provider]  metoprolol succinate (TOPROL-XL) 25 MG 24 hr tablet Take 1 tablet (25 mg total) by mouth daily. 01/04/18  Yes Sowles, Drue Stager, MD   rosuvastatin (CRESTOR) 40 MG tablet Take 1 tablet (40 mg total) by mouth daily. 01/04/18 02/13/18 Yes Steele Sizer, MD  aspirin EC 81 MG tablet Take 81 mg by mouth daily.    [provider]  famotidine (PEPCID) 20 MG tablet Take 1 tablet (20 mg total) by mouth 2 (two) times daily. 01/04/18 02/03/18  Steele Sizer, MD  gabapentin (NEURONTIN) 300 MG capsule Take 1 capsule (300 mg total) by mouth 2 (two) times daily. 01/04/18 02/03/18  Steele Sizer, MD  insulin glargine (LANTUS) 100 UNIT/ML injection Inject 0.1 mLs (10 Units total) into the skin daily. 01/04/18 02/03/18  Steele Sizer, MD  lacosamide 100 MG TABS Take 1 tablet (100 mg total) by mouth 2 (two) times daily. Patient not taking: Reported on 02/13/2018 05/22/17   Fritzi Mandes, MD  promethazine (PHENERGAN) 12.5 MG tablet Take 1 tablet (12.5 mg total) by mouth every 6 (six) hours as needed for  nausea or vomiting. Patient not taking: Reported on 02/13/2018 03/10/17   Stark Klein, MD  sertraline (ZOLOFT) 25 MG tablet Take 1 tablet (25 mg total) by mouth daily. Patient not taking: Reported on 02/13/2018 01/04/18   Steele Sizer, MD   Allergies Allergies  Allergen Reactions  . Cephalexin Itching  . Contrast Media [Iodinated Diagnostic Agents] Itching  . Doxycycline Itching  . Gabapentin Itching  . Sulfa Antibiotics Itching    Family History Family History  Problem Relation Age of Onset  . Heart disease Father   . Heart attack Father   . Heart disease Sister   . Heart attack Sister   . Heart disease Brother   . Heart attack Brother   . Tremor Mother   . Asthma Grandchild   . Breast cancer Maternal Aunt    Social History  reports that she has never smoked. She has never used smokeless tobacco. She reports that she does not drink alcohol or use drugs.  Review Of Systems: Unable to obtain as patient is on continuous BiPAP  VITAL SIGNS: BP (!) 163/66   Pulse 88   Temp 98.2 F (36.8 C) (Axillary)   Resp (!) 25   Ht  5\' 7"  (1.702 m)   Wt 85.5 kg   SpO2 100%   BMI 29.54 kg/m   HEMODYNAMICS:    VENTILATOR SETTINGS:    INTAKE / OUTPUT: No intake/output data recorded.  PHYSICAL EXAMINATION: General: Acutely ill looking, in moderate respiratory distress HEENT: PERRLA, trachea midline, moderate JVD Neuro: Alert and oriented x3, no focal deficits Cardiovascular: Apical pulse regular, S1-S2, no murmur regurg or gallop, +2 pulses bilaterally, +2 pitting edema bilaterally Lungs: Bilateral breath sounds with diffuse crackles in anterior lung fields, no wheezes or rhonchi Abdomen: Nondistended, normal bowel sounds in all 4 quadrants, palpation reveals no organomegaly Musculoskeletal: No joint deformities, positive range of motion Skin: Warm and dry  LABS:  BMET Recent Labs  Lab 06/05/18 2003  NA 139  K 3.4*  CL 106  CO2 26  BUN 14  CREATININE 1.33*  GLUCOSE 149*    Electrolytes Recent Labs  Lab 06/05/18 2003  CALCIUM 8.3*    CBC Recent Labs  Lab 06/05/18 2003  WBC 10.4  HGB 9.9*  HCT 32.0*  PLT 359    Coag's No results for input(s): APTT, INR in the last 168 hours.  Sepsis Markers Recent Labs  Lab 06/05/18 2003  PROCALCITON <0.10    ABG No results for input(s): PHART, PCO2ART, PO2ART in the last 168 hours.  Liver Enzymes No results for input(s): AST, ALT, ALKPHOS, BILITOT, ALBUMIN in the last 168 hours.  Cardiac Enzymes Recent Labs  Lab 06/05/18 2003  TROPONINI 0.06*    Glucose No results for input(s): GLUCAP in the last 168 hours.  Imaging Ct Chest Wo Contrast  Result Date: 06/05/2018 CLINICAL DATA:  Dyspnea starting at 1800 hours EXAM: CT CHEST WITHOUT CONTRAST TECHNIQUE: Multidetector CT imaging of the chest was performed following the standard protocol without IV contrast. COMPARISON:  Same day CXR and 04/25/2018. Chest CT 01/24/2014 FINDINGS: Cardiovascular: Cardiomegaly without pericardial effusion. Small pericardial lymph nodes measuring 9 mm short  axis is identified anteriorly, series 2/80 and 9 mm series 2/89 are noted. Mitral annular calcifications are identified in addition to left main and three-vessel coronary arteriosclerosis. Mild-to-moderate aortic atherosclerosis without aneurysm is identified. Dilatation of the main pulmonary artery to 3.1 cm consistent with chronic pulmonary hypertension is identified. Common branch point for the brachiocephalic and left  common carotid arteries. Atherosclerosis is noted at the origin of the left subclavian artery. Mediastinum/Nodes: Calcified mediastinal and hilar lymph nodes compatible with old granulomatous disease. No adenopathy. Trachea and mainstem bronchi are patent. Air-filled esophagus without mural thickening is noted. No thyromegaly or mass. Lungs/Pleura: Moderate right and small left pleural effusions are identified. Patchy airspace disease in the left upper lobe, lingula and both lower lobes may represent stigmata of CHF and/or pneumonia. Follow-up to resolution is recommended. Redemonstration of small pulmonary nodules, the largest currently up to 5 mm in the right middle and lower lobes noted, some which appears stable dating back to 2015. Upper Abdomen: Calcified granulomata of the spleen. Mild thickening of the adrenal glands bilaterally query hyperplasia. Atrophy of the pancreas. No acute abnormality within the upper abdomen. Musculoskeletal: No chest wall mass or suspicious bone lesions identified. IMPRESSION: 1. Moderate right and small left pleural effusions with patchy airspace disease in the left upper lobe, lingula and both lower lobes which may represent stigmata of CHF and/or pneumonia. Follow-up to resolution is recommended. 2. 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 Small pulmonary nodules are identified on the right measuring up to 5 mm, some which appears stable dating back to 2015 and may reflect  old granulomatous disease as well given the mediastinal and hilar calcified lymph nodes. 3. Dilatation of the main pulmonary artery to 3.1 cm consistent with chronic pulmonary hypertension. 4. Mild thickening of the adrenal glands bilaterally query hyperplasia. 5. Left main and three-vessel coronary arteriosclerosis. Aortic Atherosclerosis (ICD10-I70.0). Electronically Signed   By: Ashley Royalty M.D.   On: 06/05/2018 23:12   Dg Chest Portable 1 View  Result Date: 06/05/2018 CLINICAL DATA:  Shortness of Breath EXAM: PORTABLE CHEST 1 VIEW COMPARISON:  04/25/2018 FINDINGS: Cardiac shadow is mildly enlarged but stable. Increasing consolidation in the right lung base is noted. Central vascular congestion with interstitial edema is noted as well. No bony abnormality is seen. IMPRESSION: Stable changes of CHF. New right basilar consolidation. Electronically Signed   By: Inez Catalina M.D.   On: 06/05/2018 20:21    STUDIES:  Last 2D echo was 04/27/2018: Left ventricular ejection fraction of 60 to 65%, and grade 2 diastolic heart failure  CULTURES: Rapid influenza  ANTIBIOTICS: Vancomycin and meropenem  SIGNIFICANT EVENTS: 06/05/2018: Admitted  LINES/TUBES: Peripheral  DISCUSSION: 78 year old female presenting with acute hypoxic respiratory failure secondary to pneumonia and acute CHF exacerbation  ASSESSMENT  Acute on chronic hypoxic respiratory failure Acute pulmonary edema Acute diastolic heart failure with exacerbation Bilateral pleural effusions HCAP Hypokalemia Mildly elevated troponin Hypertension Type 2 diabetes mellitus  PLAN Continues BiPAP and titrate to nasal cannula as tolerated Hemodynamic monitoring per ICU protocol IV diuretics Nitroglycerin topical 2D echo Cardiology consult Antibiotics as above Rapid influenza swab Trend cardiac enzymes Monitor and replace electrolytes Optimize blood pressure control with nitroglycerin and resume home oral antihypertensives Chest  x-ray in the morning If pleural effusions persist or get worse, consult IR for thoracentesis Blood glucose monitoring with sliding insulin coverage  Best Practice: Code Status: Full code Diet: N.p.o. except for sips with meds GI prophylaxis: Not indicated VTE prophylaxis:  Lovenox  FAMILY  - Updates: No family at bedside. Will update when available.   Magdalene S. Joyce [facility] ANP-BC Pulmonary and Critical Care Medicine [facility] Pager [phone] or [phone]  NB: This document was prepared using Dragon voice recognition software and may include unintentional dictation errors.  06/05/2018, 11:24 PM

## 2018-06-05 NOTE — ED Notes (Signed)
Date and time results received: 06/05/18 20:41 (use smartphrase ".now" to insert current time)  Test: troponin Critical Value: 0.06  Name of Provider Notified: Dr. Joni Fears  Orders Received? Or Actions Taken?: acknowledged

## 2018-06-06 ENCOUNTER — Telehealth: Payer: Self-pay | Admitting: Internal Medicine

## 2018-06-06 ENCOUNTER — Inpatient Hospital Stay (HOSPITAL_COMMUNITY)
Admit: 2018-06-06 | Discharge: 2018-06-06 | Disposition: A | Discharge status: Home / Self Care | Payer: Medicare Other | Attending: Internal Medicine | Admitting: Internal Medicine

## 2018-06-06 DIAGNOSIS — N183 Chronic kidney disease, stage 3 (moderate): Secondary | ICD-10-CM

## 2018-06-06 DIAGNOSIS — J810 Acute pulmonary edema: Secondary | ICD-10-CM

## 2018-06-06 DIAGNOSIS — I130 Hypertensive heart and chronic kidney disease with heart failure and stage 1 through stage 4 chronic kidney disease, or unspecified chronic kidney disease: Principal | ICD-10-CM

## 2018-06-06 DIAGNOSIS — I2510 Atherosclerotic heart disease of native coronary artery without angina pectoris: Secondary | ICD-10-CM

## 2018-06-06 DIAGNOSIS — R0602 Shortness of breath: Secondary | ICD-10-CM

## 2018-06-06 LAB — CBC
HCT: 30 % — ABNORMAL LOW (ref 36.0–46.0)
Hemoglobin: 9.2 g/dL — ABNORMAL LOW (ref 12.0–15.0)
MCH: 28.4 pg (ref 26.0–34.0)
MCHC: 30.7 g/dL (ref 30.0–36.0)
MCV: 92.6 fL (ref 80.0–100.0)
NRBC: 0 % (ref 0.0–0.2)
Platelets: 352 10*3/uL (ref 150–400)
RBC: 3.24 MIL/uL — ABNORMAL LOW (ref 3.87–5.11)
RDW: 16.5 % — ABNORMAL HIGH (ref 11.5–15.5)
WBC: 7.7 10*3/uL (ref 4.0–10.5)

## 2018-06-06 LAB — ECHOCARDIOGRAM COMPLETE
Height: 67 in
Weight: 2973.56 oz

## 2018-06-06 LAB — GLUCOSE, CAPILLARY
GLUCOSE-CAPILLARY: 191 mg/dL — AB (ref 70–99)
Glucose-Capillary: 152 mg/dL — ABNORMAL HIGH (ref 70–99)
Glucose-Capillary: 111 mg/dL — ABNORMAL HIGH (ref 70–99)
Glucose-Capillary: 176 mg/dL — ABNORMAL HIGH (ref 70–99)
Glucose-Capillary: 185 mg/dL — ABNORMAL HIGH (ref 70–99)

## 2018-06-06 LAB — INFLUENZA PANEL BY PCR (TYPE A & B)
INFLBPCR: NEGATIVE
Influenza A By PCR: NEGATIVE

## 2018-06-06 LAB — TROPONIN I
Troponin I: 0.06 ng/mL (ref <0.03)
Troponin I: 0.08 ng/mL (ref <0.03)

## 2018-06-06 LAB — BASIC METABOLIC PANEL
Anion gap: 6 (ref 5–15)
BUN: 13 mg/dL (ref 8–23)
CHLORIDE: 107 mmol/L (ref 98–111)
CO2: 27 mmol/L (ref 22–32)
CREATININE: 1.5 mg/dL — AB (ref 0.44–1.00)
Calcium: 8.3 mg/dL — ABNORMAL LOW (ref 8.9–10.3)
GFR calc non Af Amer: 33 mL/min — ABNORMAL LOW (ref >60)
GFR, EST AFRICAN AMERICAN: 38 mL/min — AB (ref >60)
Glucose, Bld: 136 mg/dL — ABNORMAL HIGH (ref 70–99)
Potassium: 3.8 mmol/L (ref 3.5–5.1)
Sodium: 140 mmol/L (ref 135–145)

## 2018-06-06 LAB — MRSA PCR SCREENING: MRSA by PCR: NEGATIVE

## 2018-06-06 LAB — MAGNESIUM: Magnesium: 2 mg/dL (ref 1.7–2.4)

## 2018-06-06 LAB — PHOSPHORUS: PHOSPHORUS: 2 mg/dL — AB (ref 2.5–4.6)

## 2018-06-06 LAB — PROCALCITONIN: Procalcitonin: 0.1 ng/mL

## 2018-06-06 MED ORDER — K PHOS MONO-SOD PHOS DI & MONO 155-852-130 MG PO TABS
500.0000 mg | ORAL_TABLET | Freq: Once | ORAL | Status: AC
  Administered 2018-06-06: 500 mg via ORAL
  Filled 2018-06-06: qty 2

## 2018-06-06 MED ORDER — FUROSEMIDE 10 MG/ML IJ SOLN: 20.0000 mg | Freq: Every day | INTRAMUSCULAR | Status: DC

## 2018-06-06 MED ORDER — NITROGLYCERIN 0.4 MG SL SUBL
0.4000 mg | SUBLINGUAL_TABLET | SUBLINGUAL | Status: DC | PRN
  Administered 2018-06-06: 0.4 mg via SUBLINGUAL

## 2018-06-06 MED ORDER — ORAL CARE MOUTH RINSE: 15.0000 mL | Freq: Two times a day (BID) | OROMUCOSAL | Status: DC

## 2018-06-06 MED ORDER — CHLORHEXIDINE GLUCONATE 0.12 % MT SOLN
15.0000 mL | Freq: Two times a day (BID) | OROMUCOSAL | Status: DC
  Administered 2018-06-06 – 2018-06-08 (×5): 15 mL via OROMUCOSAL
  Filled 2018-06-06 (×5): qty 15

## 2018-06-06 MED ORDER — NITROGLYCERIN 0.4 MG SL SUBL
SUBLINGUAL_TABLET | SUBLINGUAL | Status: AC
  Filled 2018-06-06: qty 1

## 2018-06-06 NOTE — Telephone Encounter (Signed)
Claim form for recent hospitalizations has been completed except for the addresses etc.  The charge is $50

## 2018-06-06 NOTE — Progress Notes (Signed)
*  PRELIMINARY RESULTS* Echocardiogram 2D Echocardiogram has been performed.  Pamela Collier 06/06/2018, 8:24 AM

## 2018-06-06 NOTE — Consult Note (Signed)
Cardiology Consultation:   Patient ID: Pamela Collier MRN: [id number]; DOB: 1940-08-25  Admit date: 06/05/2018 Date of Consult: 06/06/2018  Primary Care Provider: [name], MD Primary Cardiologist: Dr. Rockey Situ   Patient Profile:   Pamela Collier is a 78 y.o. female with a hx of CAD s/p multiple PCI, chronic diastolic heart failure (prior systolic heart failure, but normalized EF), pulmonary sarcoidosis, history of CVA, chronic kidney disease stage 3, type II diabetes, PVD, sleep apnea who is being seen today for the evaluation of hypoxic respiratory failure at the request of Dr. Jannifer Franklin.  History of Present Illness:   Pamela Collier has difficulty giving a full history, but she does endorse that her breathing became acutely worse yesterday morning. She denies chest pain, palpitations, nausea, diaphoresis. No fevers/chills. Doesn't weigh herself routinely, unsure if her weight has changed. Is not sure if her legs have been swollen.   When EMS arrived yesterday, her O2 sat was 81% on room air. She was placed on CPAP. On arrival to ER, her blood pressure was very elevated at 190/70. She was given nitropaste and lasix, placed on BiPAP and transferred to the ICU.   She cannot remember having chest pain, but per nursing she endorsed chest pain during this admission, which resolved with nitroglycerin. She does endorse a mild-moderate headache after the nitroglycerin.  Troponins have been largely flat (0.06 - 0.08- 0.06). CT shows moderate R and small L pleural effusions. Since admission, her white count has decreased from 12.3 to 7.7. Her Hgb is stable at 9, and her Cr is 1.5.  Past Medical History:  Diagnosis Date  . Acute respiratory failure ([place]) 11/21/2014  . Anxiety   . Arthritis 08/05/2013  . Atherosclerotic peripheral vascular disease with gangrene [facility]) august 2012  . Cardiomyopathy, ischemic    a. EF 35 to 40% by echo in 2013 b. EF improved to 50-55% by echo in 04/2015; 11/2016  Echo: EF 55-60%, Gr1 DD, mildly dil LA, nl RV fxn.  . Cerebral infarct ([place]) 08/17/2013  . Chronic diastolic CHF (congestive heart failure) (HCC)    a. EF 50-55% by echo in 04/2015; b. 11/2016 Echo: EF 55-60%, Gr1 DD.  [name] COPD (chronic obstructive pulmonary disease) ([place])   . Coronary artery disease, occlusive    a. Previous PCI to the LAD, LCx, and RCA in 2010, 2011, 2013, and 2016. All performed in [place]; b. 11/2016 Lexiscan MV: EF 43%, no ischemia->low risk.  . 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 Diabetic diarrhea ([place]) 10/03/2014  . DM type 2, uncontrolled, with renal complications ([place]) 01/11/6383  . Hepatic steatosis    by CT abd pelvis  . History of kidney stones    at a younger age  . Hyperlipidemia LDL goal <100 02/23/2014  . Hypertensive heart disease   . Osteoporosis, post-menopausal   . 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 Pill esophagitis   . Pleural effusion 10/25/2012   10/2012 CT chest >> small to moderate R lung effusion>> chylothorax, 100% lymphs 10/2013 thoracentesis> cytology negative, WBC 1471, > 90% "small lymphs" 01/2014 CT chest> near complete resolution of pleural effusion, stable lymphadenopathy 08/2014 CT chest [place] ([facility]): small right sided effusion decreased in size, stable mediastinal lymphadenopathy 1.0cm largest, 50m . Pulmonary sarcoidosis ([facility] 12/07/2012   [place] 03[place]2013 Full PFT  [facility] > UNACCEPTABLE AND NOT REPRODUCIBLE DATA> Ratio 71% FEV 1 1.02 L (55% pred), FVC 1.31 L (49% pred) could not do lung volumes or DLCO   . Sleep apnea   . Stage III chronic kidney disease [facility])     Past Surgical History:  Procedure Laterality Date  . ABDOMINAL HYSTERECTOMY     at ge 39. secondary to bleeding/partial  . ANTERIOR CERVICAL DECOMP/DISCECTOMY FUSION N/A 10/10/2016   Procedure: Anterior Cervical  Discectomy Fusion - Cervical four4- five - Cervical five-Cervical six - Cervical six-Cervical seven;  Surgeon: Earnie Larsson, MD;  Location: [place];  Service: Neurosurgery;  Laterality: N/A;  . BREAST CYST ASPIRATION Right   . CARPAL TUNNEL RELEASE Bilateral   . CHOLECYSTECTOMY     in [place]   . COLONOSCOPY WITH PROPOFOL N/A 01/09/2015   Procedure: COLONOSCOPY WITH PROPOFOL;  Surgeon: Lucilla Lame, MD;  Location: [facility] ENDOSCOPY;  Service: Endoscopy;  Laterality: N/A;  . CORONARY ANGIOPLASTY WITH STENT PLACEMENT     [place]; [facility]  . EYE SURGERY     bil cataracts  . HERNIA REPAIR     umbilical/ Dr Pat Patrick  . OOPHORECTOMY    . PTCA  August 2012   Right Posterior tibial artery , [place]  . TOE AMPUTATION  Sept 2012   Right 2nd toe, [place]     Home Medications:  Prior to Admission medications   Medication Sig Start Date End Date Taking? Authorizing Provider  allopurinol (ZYLOPRIM) 100 MG tablet Take 1 tablet (100 mg total) by mouth daily. 03/22/18 03/22/19 Yes Wieting, Richard, MD  ALPRAZolam [name]) 0.25 MG tablet Take 1 tablet (0.25 mg total) by mouth daily. 04/30/18  Yes Henreitta Leber, MD  amLODipine (NORVASC) 5 MG tablet Take 2 tablets (10 mg total) by mouth daily. 04/30/18  Yes Henreitta Leber, MD  aspirin EC 81 MG tablet Take 81 mg by mouth daily.   Yes [provider]  dicyclomine (BENTYL) 10 MG capsule Take 1 capsule (10 mg total) by mouth 4 (four) times daily -  before meals and at bedtime. 03/10/18  Yes [name], MD  donepezil (ARICEPT) 10 MG tablet Take 10 mg by mouth at bedtime.   Yes [provider]  feeding supplement, GLUCERNA SHAKE, (GLUCERNA SHAKE) LIQD Take 237 mLs by mouth 2 (two) times daily between meals. 05/27/17  Yes Gouru, Illene Silver, MD  furosemide (LASIX) 20 MG tablet Take 1 tablet (20 mg ) once daily on Monday, Wednesday and Friday. 05/17/18  Yes Dunn, Areta Haber, PA-C  guaiFENesin (MUCINEX) 600 MG 12 hr tablet Take 600 mg  by mouth 2 (two) times daily.   Yes [provider]  hydrALAZINE (APRESOLINE) 100 MG tablet Take 1 tablet (100 mg total) by mouth every 8 (eight) hours. 04/30/18  Yes Henreitta Leber, MD  [place] [facility]) CREA Apply frequent to sacral ulcer 06/01/18  Yes [name], MD  insulin aspart protamine- aspart (NOVOLOG MIX 70/30) (70-30) 100 UNIT/ML injection Inject 0.3 mLs (30 Units total) into the skin 2 (two) times daily with a meal. Patient taking differently: Inject 30-45 Units into the skin 2 (two) times daily with a meal. 30 units in the morning and 45 units at night 03/22/18  Yes Wieting, Richard, MD  isosorbide mononitrate (IMDUR) 30 MG 24 hr tablet Take 1 tablet (30 mg total) by mouth daily. 03/03/18  Yes Gollan, Kathlene November, MD  mometasone-formoterol (DULERA) 200-5 MCG/ACT AERO Inhale 2  puffs into the lungs 2 (two) times daily. 02/01/18  Yes [name], MD  predniSONE (DELTASONE) 10 MG tablet Take 10 mg by mouth daily with breakfast.   Yes [provider]  pregabalin (LYRICA) 100 MG capsule Take 100 mg by mouth 2 (two) times daily.    Yes [provider]  propranolol (INDERAL) 10 MG tablet Take 10 mg by mouth daily.   Yes [provider]  rosuvastatin (CRESTOR) 20 MG tablet Take 1 tablet (20 mg total) by mouth daily. 04/16/18  Yes [name], MD  ticagrelor (BRILINTA) 60 MG TABS tablet Take 1 tablet (60 mg total) by mouth 2 (two) times daily. 04/22/18  Yes Minna Merritts, MD  acetaminophen (TYLENOL) 500 MG tablet Take 1,000 mg by mouth every 6 (six) hours as needed for mild pain or headache.     [provider]  B-D UF III MINI PEN NEEDLES 31G X 5 MM MISC USE THREE TIMES A DAY 02/18/18   [name], MD  blood glucose meter kit and supplies Dispense based on patient and insurance preference. Use up to three times daily as directed. (FOR ICD-10 E11.21) 06/03/18   [name], MD  diphenoxylate-atropine (LOMOTIL)  2.5-0.025 MG tablet Take 1 tablet by mouth 4 (four) times daily as needed for diarrhea or loose stools. 03/22/18   Loletha Grayer, MD  fluticasone (FLONASE) 50 MCG/ACT nasal spray Place 2 sprays into both nostrils daily as needed for rhinitis. 01/20/18   [name], MD  glucose blood (ACCU-CHEK AVIVA PLUS) test strip USE TO CHECK GLUCOSE THREE TIMES DAILY 02/16/18   [name], MD  glucose blood test strip Use to check blood sugars 2 times daily 02/16/18   [name], MD  guaiFENesin-dextromethorphan (ROBITUSSIN DM) 100-10 MG/5ML syrup Take 5 mLs by mouth every 4 (four) hours as needed for cough. 04/30/18   Henreitta Leber, MD  nitroGLYCERIN (NITROLINGUAL) 0.4 MG/SPRAY spray Place 1 spray under the tongue every 5 (five) minutes x 3 doses as needed for chest pain. 01/14/17   [name], MD  ondansetron (ZOFRAN) 4 MG tablet Take 4 mg by mouth every 8 (eight) hours as needed for nausea or vomiting.    [provider]  senna (SENOKOT) 8.6 MG tablet Take 1 tablet by mouth 2 (two) times daily as needed for constipation.    [provider]    Inpatient Medications: Scheduled Meds: . ALPRAZolam  0.25 mg Oral Daily  . amLODipine  10 mg Oral Daily  . aspirin EC  81 mg Oral Daily  . chlorhexidine  15 mL Mouth Rinse BID  . donepezil  10 mg Oral QHS  . enoxaparin (LOVENOX) injection  40 mg Subcutaneous Q24H  . [START ON 06/07/2018] furosemide  20 mg Intravenous Daily  . hydrALAZINE  100 mg Oral Q8H  . insulin aspart  0-5 Units Subcutaneous QHS  . insulin aspart  0-9 Units Subcutaneous TID WC  . isosorbide mononitrate  30 mg Oral Daily  . mouth rinse  15 mL Mouth Rinse q12n4p  . rosuvastatin  20 mg Oral Daily  . ticagrelor  60 mg Oral BID   Continuous Infusions:  PRN Meds: acetaminophen **OR** acetaminophen, benzonatate, guaiFENesin-dextromethorphan, ipratropium-albuterol, nitroGLYCERIN, ondansetron **OR** ondansetron (ZOFRAN) IV  Allergies:    Allergies    Allergen Reactions  . Cephalexin Itching  . Contrast Media [Iodinated Diagnostic Agents] Itching  . Doxycycline Itching  . Gabapentin Itching  . Sulfa Antibiotics Itching  Social History:   Social History   Socioeconomic History  . Marital status: Married    Spouse name: Not on file  . Number of children: Not on file  . Years of education: Not on file  . Highest education level: Not on file  Occupational History  . Occupation: retired  Scientific laboratory technician  . Financial resource strain: Not on file  . Food insecurity:    Worry: Not on file    Inability: Not on file  . Transportation needs:    Medical: Not on file    Non-medical: Not on file  Tobacco Use  . Smoking status: Never Smoker  . Smokeless tobacco: Never Used  Substance and Sexual Activity  . Alcohol use: No    Alcohol/week: 0.0 standard drinks  . Drug use: No  . Sexual activity: Never  Lifestyle  . Physical activity:    Days per week: Not on file    Minutes per session: Not on file  . Stress: Not on file  Relationships  . Social connections:    Talks on phone: Not on file    Gets together: Not on file    Attends religious service: Not on file    Active member of club or organization: Not on file    Attends meetings of clubs or organizations: Not on file    Relationship status: Not on file  . Intimate partner violence:    Fear of current or ex partner: Not on file    Emotionally abused: Not on file    Physically abused: Not on file    Forced sexual activity: Not on file  Other Topics Concern  . Not on file  Social History Narrative  . Not on file    Family History:    Family History  Problem Relation Age of Onset  . Heart disease Father   . Heart attack Father   . Heart disease Sister   . Heart attack Sister   . Heart disease Brother   . Heart attack Brother   . Tremor Mother   . Asthma Grandchild   . Breast cancer Maternal Aunt      ROS:  Please see the history of present illness.  Review  of Systems  Constitutional: Positive for malaise/fatigue. Negative for chills and fever.  HENT: Negative for ear pain and sinus pain.   Eyes: Negative for double vision and pain.  Respiratory: Positive for cough, shortness of breath and wheezing.   Cardiovascular: Positive for chest pain. Negative for palpitations and claudication.  Gastrointestinal: Negative for abdominal pain, blood in stool and melena.  Genitourinary: Negative for dysuria and hematuria.  Musculoskeletal: Negative for falls and myalgias.  Neurological: Negative for sensory change, speech change, loss of consciousness and weakness.   All other ROS reviewed and negative.     Physical Exam/Data:   Vitals:   06/06/18 0800 06/06/18 1000 06/06/18 1100 06/06/18 1200  BP: (!) 150/47     Pulse: 84 84 81 83  Resp: (!) 26 (!) 28 (!) 31 (!) 22  Temp: 98.1 F (36.7 C)     TempSrc:      SpO2: 96%     Weight:      Height:        Intake/Output Summary (Last 24 hours) at 06/06/2018 1212 Last data filed at 06/06/2018 1001 Gross per 24 hour  Intake 351.16 ml  Output 1000 ml  Net -648.84 ml   Last 3 Weights 06/06/2018 06/05/2018 06/01/2018  Weight (lbs) 185  lb 13.6 oz 188 lb 9.6 oz 188 lb 9.6 oz  Weight (kg) 84.3 kg 85.548 kg 85.548 kg     Body mass index is 29.11 kg/m.  General:  Well nourished, well developed, sitting in chair. Does appear somewhat tremulous. HEENT: normal Lymph: no adenopathy Neck: difficult to appreciate JVD, but appears elevated to mid neck sitting upright in chair Endocrine:  No thryomegaly Vascular: No carotid bruits; FA pulses 2+ bilaterally without bruits  Cardiac:  normal S1, S2; RRR; no murmur appreciated but lung sounds may mask Lungs:  Diffusely coarse/crackles, with diminished breath sounds in both bases.  Abd: soft, nontender, no hepatomegaly  Ext: 2+ bilateral pitting LE edema Musculoskeletal:  No deformities, BUE and BLE strength normal and equal Skin: warm and dry  Neuro:  CNs 2-12  intact, no focal abnormalities noted Psych:  Normal affect, though poor historian.  EKG:  The EKG was personally reviewed and demonstrates:  Likely ectopic atrial rhythm, as P waves best seen in lead V5. Telemetry:  Telemetry was personally reviewed and demonstrates: sinus rhythm, except for about 3 minutes of intermittent atrial tachycardia around 10 AM (not completely irregular, but baseline difficult to see to determine etiology)  Relevant CV Studies: Echo today: - Left ventricle: The cavity size was normal. There was moderate   concentric hypertrophy. Systolic function was normal. The   estimated ejection fraction was in the range of 60% to 65%. Wall   motion was normal; there were no regional wall motion   abnormalities. There was a reduced contribution of atrial   contraction to ventricular filling, due to increased ventricular   diastolic pressure. - Mitral valve: Calcified annulus. - Atrial septum: No defect or patent foramen ovale was identified. - Pericardium, extracardiac: A trivial pericardial effusion was   identified.  Impressions:  - No significant change from prior.  Laboratory Data:  Chemistry Recent Labs  Lab 06/05/18 2003 06/06/18 0431  NA 139 140  K 3.4* 3.8  CL 106 107  CO2 26 27  GLUCOSE 149* 136*  BUN 14 13  CREATININE 1.33* 1.50*  CALCIUM 8.3* 8.3*  GFRNONAA 38* 33*  GFRAA 44* 38*  ANIONGAP 7 6    No results for input(s): PROT, ALBUMIN, AST, ALT, ALKPHOS, BILITOT in the last 168 hours. Hematology Recent Labs  Lab 06/05/18 2003 06/06/18 0431  WBC 10.4 7.7  RBC 3.48* 3.24*  HGB 9.9* 9.2*  HCT 32.0* 30.0*  MCV 92.0 92.6  MCH 28.4 28.4  MCHC 30.9 30.7  RDW 16.3* 16.5*  PLT 359 352   Cardiac Enzymes Recent Labs  Lab 06/05/18 2003 06/05/18 2349 06/06/18 0431  TROPONINI 0.06* 0.08* 0.06*   No results for input(s): TROPIPOC in the last 168 hours.  BNPNo results for input(s): BNP, PROBNP in the last 168 hours.  DDimer No results for  input(s): DDIMER in the last 168 hours.  Radiology/Studies:  Ct Chest Wo Contrast  Result Date: 06/05/2018 CLINICAL DATA:  Dyspnea starting at 1800 hours EXAM: CT CHEST WITHOUT CONTRAST TECHNIQUE: Multidetector CT imaging of the chest was performed following the standard protocol without IV contrast. COMPARISON:  Same day CXR and 04/25/2018. Chest CT 01/24/2014 FINDINGS: Cardiovascular: Cardiomegaly without pericardial effusion. Small pericardial lymph nodes measuring 9 mm short axis is identified anteriorly, series 2/80 and 9 mm series 2/89 are noted. Mitral annular calcifications are identified in addition to left main and three-vessel coronary arteriosclerosis. Mild-to-moderate aortic atherosclerosis without aneurysm is identified. Dilatation of the main pulmonary artery to 3.1 cm  consistent with chronic pulmonary hypertension is identified. Common branch point for the brachiocephalic and left common carotid arteries. Atherosclerosis is noted at the origin of the left subclavian artery. Mediastinum/Nodes: Calcified mediastinal and hilar lymph nodes compatible with old granulomatous disease. No adenopathy. Trachea and mainstem bronchi are patent. Air-filled esophagus without mural thickening is noted. No thyromegaly or mass. Lungs/Pleura: Moderate right and small left pleural effusions are identified. Patchy airspace disease in the left upper lobe, lingula and both lower lobes may represent stigmata of CHF and/or pneumonia. Follow-up to resolution is recommended. Redemonstration of small pulmonary nodules, the largest currently up to 5 mm in the right middle and lower lobes noted, some which appears stable dating back to 2015. Upper Abdomen: Calcified granulomata of the spleen. Mild thickening of the adrenal glands bilaterally query hyperplasia. Atrophy of the pancreas. No acute abnormality within the upper abdomen. Musculoskeletal: No chest wall mass or suspicious bone lesions identified. IMPRESSION: 1.  Moderate right and small left pleural effusions with patchy airspace disease in the left upper lobe, lingula and both lower lobes which may represent stigmata of CHF and/or pneumonia. Follow-up to resolution is recommended. 2. 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 Small pulmonary nodules are identified on the right measuring up to 5 mm, some which appears stable dating back to 2015 and may reflect old granulomatous disease as well given the mediastinal and hilar calcified lymph nodes. 3. Dilatation of the main pulmonary artery to 3.1 cm consistent with chronic pulmonary hypertension. 4. Mild thickening of the adrenal glands bilaterally query hyperplasia. 5. Left main and three-vessel coronary arteriosclerosis. Aortic Atherosclerosis (ICD10-I70.0). Electronically Signed   By: Ashley Royalty M.D.   On: 06/05/2018 23:12   Dg Chest Portable 1 View  Result Date: 06/05/2018 CLINICAL DATA:  Shortness of Breath EXAM: PORTABLE CHEST 1 VIEW COMPARISON:  04/25/2018 FINDINGS: Cardiac shadow is mildly enlarged but stable. Increasing consolidation in the right lung base is noted. Central vascular congestion with interstitial edema is noted as well. No bony abnormality is seen. IMPRESSION: Stable changes of CHF. New right basilar consolidation. Electronically Signed   By: Inez Catalina M.D.   On: 06/05/2018 20:21    Assessment and Plan:   Acute hypoxic respiratory failure, with bilateral pleural effusions and pulmonary edema: unclear trigger, did have leukocytosis on arrival. However, very elevated blood pressure suggests flash pulmonary edema with likely contribution of acute on chronic diastolic heart failure.  -Echo today unable to determine right sided filling pressures, but otherwise largely unchanged from prior -telemetry/ECG suggest she may have some ectopic atrial rhythms, which is not surprising in the setting of pulmonary  sarcoidosis -leukocytosis, imaging: primary team also treating for HCAP -pending transfer from ICU -admission weight 85.5 kg, today 84.3 kg. Net negative ~700 ml. Would continue diuresis with goal >1L negative daily, watch renal function and electrolytes. -needs continued heart failure teaching, would continue home health care at discharge with regular weights, heart failure teaching, etc  Hypertension, with hypertensive urgency vs. Emergency on presentation -BP much improved, continue amlodipine, hydralazine, lasix, imdur  History of CAD -continue aspirin, ticagrelor low dose, rosuvastatin  Type II diabetes, pulmonary sarcoidosis, chronic kidney disease: per primary team  For questions or updates, please contact [facility] Please consult [email or web address] for contact info under   Signed, Buford Dresser, MD  06/06/2018 12:12 PM

## 2018-06-06 NOTE — Evaluation (Signed)
Physical Therapy Evaluation Patient Details [name] MRN: [id number] DOB: [birth year] Today's Date: 06/06/2018   History of Present Illness  Pt is a 78 y/o F who presented with SOB.  Imaging is consistent with pulmonary edema.  Pt required BiPAP initially and since has been weaned to [facility].  Pt's PMH includes CKD, osteoporosis, COPD, cerebral infarct.      Clinical Impression  Pt admitted with above diagnosis. Pt currently with functional limitations due to the deficits listed below (see PT Problem List). Pt reports she was recently d/c to home from rehab where she had a bed sore which continues to cause 8/10 pain, recommending air mattress at this time.  She currently requires up to min assist for bed mobility.  She requires min assist for sit>stand and was unable to attempt ambulation this date due to fatigue with standing EOB marching activity. Pt found on and remained on 1L O2 throughout session.  SpO2 85-89% upon PT arrival with pt sidelying in bed.  This improved to low to mid 90s with demonstration and cues for pursed lip breathing.  Sitting EOB SpO2 remained at or above 92% following bed mobility.  SpO2 remained at or above 90% with sit>stand.  SpO2 91% at end of session with pt in partial R sidelying. Given pt's current mobility status, recommending SNF at d/c.  Pt will benefit from skilled PT to increase their independence and safety with mobility to allow discharge to the venue listed below.      Follow Up Recommendations SNF    Equipment Recommendations  Other (comment)(TBD at next venue of care)    Recommendations for Other Services       Precautions / Restrictions Precautions Precautions: Fall;Other (comment) Precaution Comments: O2 Restrictions Weight Bearing Restrictions: No      Mobility  Bed Mobility Overal bed mobility: Needs Assistance Bed Mobility: Supine to Sit;Sit to Supine;Rolling Rolling: Min assist   Supine to sit: Min guard;HOB elevated Sit to  supine: Min assist   General bed mobility comments: Increased effort and time to push up into sitting.  To return to supine the pt requires min assist to bring LEs into bed.   Transfers Overall transfer level: Needs assistance Equipment used: Rolling walker (2 wheeled) Transfers: Sit to/from Stand Sit to Stand: Min assist         General transfer comment: Cues for proper hand placement.  Pt requires assist to boost to stand.    Ambulation/Gait             General Gait Details: Unable to attempt due to fatigue.   Stairs            Wheelchair Mobility    Modified Rankin (Stroke Patients Only)       Balance Overall balance assessment: Needs assistance;History of Falls Sitting-balance support: No upper extremity supported;Feet supported Sitting balance-Leahy Scale: Fair Sitting balance - Comments: Pt able to sit EOB without UE support but would likely lose balance with perturbation   Standing balance support: Bilateral upper extremity supported;During functional activity Standing balance-Leahy Scale: Poor Standing balance comment: Pt relied on BUE support for static activity and marching activity                             Pertinent Vitals/Pain Pain Assessment: 0-10 Pain Score: 8  Pain Location: buttocks(pt reports recent bed sore) Pain Descriptors / Indicators: Aching;Grimacing Pain Intervention(s): Limited activity within patient's tolerance;Monitored during session  Home Living Family/patient expects to be discharged to:: Private residence Living Arrangements: Spouse/significant other Available Help at Discharge: Family;Available 24 hours/day(but husband can provide limited physical assist) Type of Home: House Home Access: Stairs to enter Entrance Stairs-Rails: None Entrance Stairs-Number of Steps: 1 Home Layout: One level Home Equipment: [place] - 4 wheels;Walker - 2 wheels;Shower seat - built in;Bedside commode;Hand held shower  head Additional Comments: Husband ambulates with SPC and can provide limited physical assist. Per husband, plan is to move closer to their children sometime soon to receive more assist.     Prior Function Level of Independence: Needs assistance   Gait / Transfers Assistance Needed: Ambulating household distances with walker.  1 fall in the past 6 months.   ADL's / Homemaking Assistance Needed: Aide assist with bathing and dressing.  Husband does the cooking and cleaning.   Comments: Aide comes 2 days/wk for 0.5 hr.       Hand Dominance        Extremity/Trunk Assessment   Upper Extremity Assessment Upper Extremity Assessment: Generalized weakness    Lower Extremity Assessment Lower Extremity Assessment: Generalized weakness    Cervical / Trunk Assessment Cervical / Trunk Assessment: Kyphotic  Communication   Communication: No difficulties  Cognition Arousal/Alertness: Awake/alert Behavior During Therapy: Flat affect Overall Cognitive Status: Within Functional Limits for tasks assessed                                        General Comments General comments (skin integrity, edema, etc.): Pt found on and remained on 1L O2 throughout session.  SpO2 85-89% upon PT arrival with pt sidelying in bed.  This improved to low to mid 90s with demonstration and cues for pursed lip breathing.  Sitting EOB SpO2 remained at or above 92% following bed mobility.  SpO2 remained at or above 90% with sit>stand.  SpO2 91% at end of session with pt in partial R sidelying with pillow for pressure relief from pt reported recent bed sore.      Exercises General Exercises - Lower Extremity Hip Flexion/Marching: Both;5 reps;Standing   Assessment/Plan    PT Assessment Patient needs continued PT services  PT Problem List Decreased strength;Decreased activity tolerance;Decreased balance;Decreased mobility;Decreased knowledge of use of DME;Decreased safety awareness;Cardiopulmonary  status limiting activity;Pain       PT Treatment Interventions DME instruction;Gait training;Stair training;Functional mobility training;Therapeutic activities;Therapeutic exercise;Balance training;Neuromuscular re-education;Patient/family education;Wheelchair mobility training    PT Goals (Current goals can be found in the Care Plan section)  Acute Rehab PT Goals Patient Stated Goal: decreased pain, to feel better PT Goal Formulation: With patient Time For Goal Achievement: 06/20/18 Potential to Achieve Goals: Good    Frequency Min 2X/week   Barriers to discharge Inaccessible home environment;Decreased caregiver support 1 step to enter home and husband unable to provide level of assist the pt currently requires    Co-evaluation               AM-PAC PT "6 Clicks" Mobility  Outcome Measure Help needed turning from your back to your side while in a flat bed without using bedrails?: A Little Help needed moving from lying on your back to sitting on the side of a flat bed without using bedrails?: A Little Help needed moving to and from a bed to a chair (including a wheelchair)?: A Lot Help needed standing up from a chair using your arms (e.g.,  wheelchair or bedside chair)?: A Little Help needed to walk in hospital room?: A Lot Help needed climbing 3-5 steps with a railing? : Total 6 Click Score: 14    End of Session Equipment Utilized During Treatment: Gait belt;Oxygen Activity Tolerance: Patient limited by fatigue Patient left: in bed;with call bell/phone within reach;with bed alarm set;with SCD's reapplied;with family/visitor present Nurse Communication: Mobility status;Other (comment)(SpO2, recommend air mattress) PT Visit Diagnosis: Muscle weakness (generalized) (M62.81);Unsteadiness on feet (R26.81);Difficulty in walking, not elsewhere classified (R26.2)    Time: 6438-3779 PT Time Calculation (min) (ACUTE ONLY): 33 min   Charges:   PT Evaluation $PT Eval Low  Complexity: 1 Low PT Treatments $Therapeutic Activity: 8-22 mins        Collie Siad PT, DPT 06/06/2018, 4:21 PM

## 2018-06-06 NOTE — Progress Notes (Addendum)
[place] [facility] [place] NAME: Rozann Holts    MR#:  [id number]  DATE OF BIRTH:  04/03/1941  SUBJECTIVE:   Patient states that her breathing is much better this morning.  She was able to come off BiPAP and has been stable on 1 L O2 by nasal cannula.  She denies any chest pain or cough.  REVIEW OF SYSTEMS:  Review of Systems  Constitutional: Negative for chills and fever.  HENT: Negative for congestion and sore throat.   Eyes: Negative for blurred vision and double vision.  Respiratory: Negative for cough and shortness of breath.   Cardiovascular: Negative for chest pain, palpitations and leg swelling.  Gastrointestinal: Negative for nausea and vomiting.  Genitourinary: Negative for dysuria and urgency.  Musculoskeletal: Negative for back pain and neck pain.  Neurological: Negative for dizziness and headaches.  Psychiatric/Behavioral: Negative for depression. The patient is not nervous/anxious.     DRUG ALLERGIES:   Allergies  Allergen Reactions  . Cephalexin Itching  . Contrast Media [Iodinated Diagnostic Agents] Itching  . Doxycycline Itching  . Gabapentin Itching  . Sulfa Antibiotics Itching   VITALS:  Blood pressure (!) 130/48, pulse 79, temperature 98.9 F (37.2 C), temperature source Oral, resp. rate (!) 21, height 5\' 7"  (1.702 m), weight 84.3 kg, SpO2 98 %. PHYSICAL EXAMINATION:  Physical Exam  General: Sitting up in chair, in NAD HEENT: Normocephalic, atraumatic, EOMI, no scleral icterus, moist mucous membranes Neck: Normal range of motion. Neck supple. No JVD present. No thyromegaly present.  Cardiovascular: RRR, no murmurs, rubs, gallops. Respiratory: + Mild bibasilar crackles present, no wheezing or rhonchi.  Normal work of breathing.  Nasal cannula in place. GI: Soft.   Normal bowel sounds.  Nontender, nondistended. Musculoskeletal: + Trace pitting edema.  No cyanosis or clubbing. Neurological: CN II through XII  grossly intact, no focal deficits, sensation intact throughout, + global weakness, + mild tremor Skin: Skin is warm and dry. No rash noted.  Psychiatric: She has a normal mood and affect. Her behavior is normal. Judgment and thought content normal.  LABORATORY PANEL:  Female CBC Recent Labs  Lab 06/06/18 0431  WBC 7.7  HGB 9.2*  HCT 30.0*  PLT 352   ------------------------------------------------------------------------------------------------------------------ Chemistries  Recent Labs  Lab 06/06/18 0431  NA 140  K 3.8  CL 107  CO2 27  GLUCOSE 136*  BUN 13  CREATININE 1.50*  CALCIUM 8.3*  MG 2.0   RADIOLOGY:  Ct Chest Wo Contrast  Result Date: 06/05/2018 CLINICAL DATA:  Dyspnea starting at 1800 hours EXAM: CT CHEST WITHOUT CONTRAST TECHNIQUE: Multidetector CT imaging of the chest was performed following the standard protocol without IV contrast. COMPARISON:  Same day CXR and 04/25/2018. Chest CT 01/24/2014 FINDINGS: Cardiovascular: Cardiomegaly without pericardial effusion. Small pericardial lymph nodes measuring 9 mm short axis is identified anteriorly, series 2/80 and 9 mm series 2/89 are noted. Mitral annular calcifications are identified in addition to left main and three-vessel coronary arteriosclerosis. Mild-to-moderate aortic atherosclerosis without aneurysm is identified. Dilatation of the main pulmonary artery to 3.1 cm consistent with chronic pulmonary hypertension is identified. Common branch point for the brachiocephalic and left common carotid arteries. Atherosclerosis is noted at the origin of the left subclavian artery. Mediastinum/Nodes: Calcified mediastinal and hilar lymph nodes compatible with old granulomatous disease. No adenopathy. Trachea and mainstem bronchi are patent. Air-filled esophagus without mural thickening is noted. No thyromegaly or mass. Lungs/Pleura: Moderate right and small left pleural effusions are  identified. Patchy airspace disease in the left  upper lobe, lingula and both lower lobes may represent stigmata of CHF and/or pneumonia. Follow-up to resolution is recommended. Redemonstration of small pulmonary nodules, the largest currently up to 5 mm in the right middle and lower lobes noted, some which appears stable dating back to 2015. Upper Abdomen: Calcified granulomata of the spleen. Mild thickening of the adrenal glands bilaterally query hyperplasia. Atrophy of the pancreas. No acute abnormality within the upper abdomen. Musculoskeletal: No chest wall mass or suspicious bone lesions identified. IMPRESSION: 1. Moderate right and small left pleural effusions with patchy airspace disease in the left upper lobe, lingula and both lower lobes which may represent stigmata of CHF and/or pneumonia. Follow-up to resolution is recommended. 2. 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 Small pulmonary nodules are identified on the right measuring up to 5 mm, some which appears stable dating back to 2015 and may reflect old granulomatous disease as well given the mediastinal and hilar calcified lymph nodes. 3. Dilatation of the main pulmonary artery to 3.1 cm consistent with chronic pulmonary hypertension. 4. Mild thickening of the adrenal glands bilaterally query hyperplasia. 5. Left main and three-vessel coronary arteriosclerosis. Aortic Atherosclerosis (ICD10-I70.0). Electronically Signed   By: Ashley Royalty M.D.   On: 06/05/2018 23:12   Dg Chest Portable 1 View  Result Date: 06/05/2018 CLINICAL DATA:  Shortness of Breath EXAM: PORTABLE CHEST 1 VIEW COMPARISON:  04/25/2018 FINDINGS: Cardiac shadow is mildly enlarged but stable. Increasing consolidation in the right lung base is noted. Central vascular congestion with interstitial edema is noted as well. No bony abnormality is seen. IMPRESSION: Stable changes of CHF. New right basilar consolidation. Electronically Signed   By:  Inez Catalina M.D.   On: 06/05/2018 20:21   ASSESSMENT AND PLAN:   Acute respiratory failure with hypoxia- due to pleural effusions, acute on chronic diastolic heart failure.  She has been transitioned off BiPAP onto 1 L O2. -Duonebs as needed -Wean O2 as able  Acute on chronic diastolic heart failure- improving  -Continue IV Lasix -Cardiology consult -ECHO pending -Strict I/O, daily weights  HCAP- initially felt to have pneumonia, but procalcitonin was negative so empiric IV antibiotics have been stopped. -Monitor closely  Essential hypertension-well-controlled -Continue home meds  Coronary artery disease- stable, no active chest pain -Continue home aspirin, brilinta, imdur  Type 2 diabetes-sugars have been elevated -Continue SSI  CKD stage 3- creatinine at baseline -Avoid nephrotoxic agents -Monitor  Hyperlipidemia- stable -Continue home statin  OSA- stable -CPAP qhs  All the records are reviewed and case discussed with Care Management/Social Worker. Management plans discussed with the patient, family and they are in agreement.  CODE STATUS: Full Code  TOTAL TIME TAKING CARE OF THIS PATIENT: 40 minutes.   More than 50% of the time was spent in counseling/coordination of care: YES  POSSIBLE D/C IN 1-2 DAYS, DEPENDING ON CLINICAL CONDITION.   Berna Spare Raynell Scott M.D on 06/06/2018 at 2:16 PM  Between 7am to 6pm - Pager [phone]  After 6pm go to [email or web address] - Proofreader  [facility]  Office  [phone]  CC: Primary care physician; [name], MD  Note: This dictation was prepared with Dragon dictation along with smaller phrase technology. Any transcriptional errors that result from this process are unintentional.

## 2018-06-07 ENCOUNTER — Inpatient Hospital Stay: Payer: Medicare Other

## 2018-06-07 ENCOUNTER — Other Ambulatory Visit: Payer: Self-pay

## 2018-06-07 ENCOUNTER — Telehealth: Payer: Self-pay

## 2018-06-07 LAB — GLUCOSE, CAPILLARY
GLUCOSE-CAPILLARY: 217 mg/dL — AB (ref 70–99)
Glucose-Capillary: 185 mg/dL — ABNORMAL HIGH (ref 70–99)
Glucose-Capillary: 187 mg/dL — ABNORMAL HIGH (ref 70–99)
Glucose-Capillary: 125 mg/dL — ABNORMAL HIGH (ref 70–99)

## 2018-06-07 LAB — BASIC METABOLIC PANEL
Anion gap: 6 (ref 5–15)
BUN: 14 mg/dL (ref 8–23)
CHLORIDE: 106 mmol/L (ref 98–111)
CO2: 27 mmol/L (ref 22–32)
Calcium: 8.2 mg/dL — ABNORMAL LOW (ref 8.9–10.3)
Creatinine, Ser: 1.5 mg/dL — ABNORMAL HIGH (ref 0.44–1.00)
GFR calc Af Amer: 38 mL/min — ABNORMAL LOW (ref >60)
GFR calc non Af Amer: 33 mL/min — ABNORMAL LOW (ref >60)
Glucose, Bld: 184 mg/dL — ABNORMAL HIGH (ref 70–99)
Potassium: 3.5 mmol/L (ref 3.5–5.1)
Sodium: 139 mmol/L (ref 135–145)

## 2018-06-07 LAB — CBC
HCT: 31.1 % — ABNORMAL LOW (ref 36.0–46.0)
Hemoglobin: 9.7 g/dL — ABNORMAL LOW (ref 12.0–15.0)
MCH: 28.2 pg (ref 26.0–34.0)
MCHC: 31.2 g/dL (ref 30.0–36.0)
MCV: 90.4 fL (ref 80.0–100.0)
Platelets: 408 10*3/uL — ABNORMAL HIGH (ref 150–400)
RBC: 3.44 MIL/uL — ABNORMAL LOW (ref 3.87–5.11)
RDW: 16.7 % — ABNORMAL HIGH (ref 11.5–15.5)
WBC: 7.3 10*3/uL (ref 4.0–10.5)
nRBC: 0 % (ref 0.0–0.2)

## 2018-06-07 MED ORDER — INSULIN ASPART PROT & ASPART (70-30 MIX) 100 UNIT/ML ~~LOC~~ SUSP
15.0000 [IU] | Freq: Two times a day (BID) | SUBCUTANEOUS | Status: DC
  Administered 2018-06-07 – 2018-06-08 (×2): 15 [IU] via SUBCUTANEOUS
  Filled 2018-06-07 (×2): qty 10

## 2018-06-07 MED ORDER — PREDNISONE 10 MG PO TABS
10.0000 mg | ORAL_TABLET | Freq: Every day | ORAL | Status: DC
  Administered 2018-06-08: 10 mg via ORAL
  Filled 2018-06-07: qty 1

## 2018-06-07 MED ORDER — PROPRANOLOL HCL 10 MG PO TABS
10.0000 mg | ORAL_TABLET | Freq: Three times a day (TID) | ORAL | Status: DC
  Administered 2018-06-07 – 2018-06-08 (×3): 10 mg via ORAL
  Filled 2018-06-07 (×7): qty 1

## 2018-06-07 MED ORDER — PREGABALIN 50 MG PO CAPS
100.0000 mg | ORAL_CAPSULE | Freq: Two times a day (BID) | ORAL | Status: DC
  Administered 2018-06-07 – 2018-06-08 (×3): 100 mg via ORAL
  Filled 2018-06-07 (×3): qty 2

## 2018-06-07 MED ORDER — LOPERAMIDE HCL 2 MG PO CAPS
2.0000 mg | ORAL_CAPSULE | Freq: Four times a day (QID) | ORAL | Status: DC | PRN
  Administered 2018-06-07: 2 mg via ORAL
  Filled 2018-06-07: qty 1

## 2018-06-07 MED ORDER — FUROSEMIDE 10 MG/ML IJ SOLN
20.0000 mg | Freq: Two times a day (BID) | INTRAMUSCULAR | Status: DC
  Administered 2018-06-07 – 2018-06-08 (×3): 20 mg via INTRAVENOUS
  Filled 2018-06-07 (×4): qty 2

## 2018-06-07 MED ORDER — MOMETASONE FURO-FORMOTEROL FUM 200-5 MCG/ACT IN AERO
2.0000 | INHALATION_SPRAY | Freq: Two times a day (BID) | RESPIRATORY_TRACT | Status: DC
  Administered 2018-06-07 – 2018-06-08 (×2): 2 via RESPIRATORY_TRACT
  Filled 2018-06-07: qty 8.8

## 2018-06-07 MED ORDER — BENZONATATE 100 MG PO CAPS
200.0000 mg | ORAL_CAPSULE | Freq: Three times a day (TID) | ORAL | Status: DC
  Administered 2018-06-07 – 2018-06-08 (×3): 200 mg via ORAL
  Filled 2018-06-07 (×3): qty 2

## 2018-06-07 NOTE — Progress Notes (Signed)
Progress Note  Patient [name] Date of Encounter: 06/07/2018  Primary Cardiologist: Dr. Rockey Situ  Subjective   She feels better this morning with less shortness of breath.  Chest x-ray shows persistent right-sided pleural effusion.  Inpatient Medications    Scheduled Meds: . ALPRAZolam  0.25 mg Oral Daily  . amLODipine  10 mg Oral Daily  . aspirin EC  81 mg Oral Daily  . chlorhexidine  15 mL Mouth Rinse BID  . donepezil  10 mg Oral QHS  . enoxaparin (LOVENOX) injection  40 mg Subcutaneous Q24H  . furosemide  20 mg Intravenous BID  . hydrALAZINE  100 mg Oral Q8H  . insulin aspart  0-5 Units Subcutaneous QHS  . insulin aspart  0-9 Units Subcutaneous TID WC  . isosorbide mononitrate  30 mg Oral Daily  . propranolol  10 mg Oral TID  . rosuvastatin  20 mg Oral Daily  . ticagrelor  60 mg Oral BID   Continuous Infusions:  PRN Meds: acetaminophen **OR** acetaminophen, benzonatate, guaiFENesin-dextromethorphan, ipratropium-albuterol, nitroGLYCERIN, ondansetron **OR** ondansetron (ZOFRAN) IV   Vital Signs    Vitals:   06/06/18 1613 06/06/18 2056 06/07/18 0513 06/07/18 0842  BP:  (!) 140/43 (!) 150/50 (!) 160/53  Pulse:  94 84 90  Resp:  20 20 18   Temp:  99.5 F (37.5 C) 98 F (36.7 C) 99 F (37.2 C)  TempSrc:  Oral  Oral  SpO2: 93% 92% 97% 94%  Weight:      Height:        Intake/Output Summary (Last 24 hours) at 06/07/2018 0956 Last data filed at 06/07/2018 4496 Gross per 24 hour  Intake 240 ml  Output 450 ml  Net -210 ml   Last 3 Weights 06/06/2018 06/05/2018 06/01/2018  Weight (lbs) 185 lb 13.6 oz 188 lb 9.6 oz 188 lb 9.6 oz  Weight (kg) 84.3 kg 85.548 kg 85.548 kg      Telemetry    NSR - Personally Reviewed  ECG    Not done today - Personally Reviewed  Physical Exam   GEN: No acute distress.   Neck:  Jugular venous pressure is not well visualized Cardiac: RRR, no murmurs, rubs, or gallops.  Respiratory:  Very diminished breath sounds at  the right lung base. GI: Soft, nontender, non-distended  MS: No edema; No deformity. Neuro:  Nonfocal  Psych: Normal affect   Labs    Chemistry Recent Labs  Lab 06/05/18 2003 06/06/18 0431 06/07/18 0422  NA 139 140 139  K 3.4* 3.8 3.5  CL 106 107 106  CO2 26 27 27   GLUCOSE 149* 136* 184*  BUN 14 13 14   CREATININE 1.33* 1.50* 1.50*  CALCIUM 8.3* 8.3* 8.2*  GFRNONAA 38* 33* 33*  GFRAA 44* 38* 38*  ANIONGAP 7 6 6      Hematology Recent Labs  Lab 06/05/18 2003 06/06/18 0431 06/07/18 0422  WBC 10.4 7.7 7.3  RBC 3.48* 3.24* 3.44*  HGB 9.9* 9.2* 9.7*  HCT 32.0* 30.0* 31.1*  MCV 92.0 92.6 90.4  MCH 28.4 28.4 28.2  MCHC 30.9 30.7 31.2  RDW 16.3* 16.5* 16.7*  PLT 359 352 408*    Cardiac Enzymes Recent Labs  Lab 06/05/18 2003 06/05/18 2349 06/06/18 0431  TROPONINI 0.06* 0.08* 0.06*   No results for input(s): TROPIPOC in the last 168 hours.   BNPNo results for input(s): BNP, PROBNP in the last 168 hours.   DDimer No results for input(s): DDIMER in the last 168 hours.  Radiology    Dg Chest 2 View  Result Date: 06/07/2018 CLINICAL DATA:  78 year old with acute respiratory failure. EXAM: CHEST - 2 VIEW COMPARISON:  Chest radiograph and CT from 06/05/2017 FINDINGS: Patchy parenchymal densities in both lungs, left side greater than right. Again noted are right basilar densities compatible with pleural effusion and atelectasis. Right pleural effusion may have enlarged. Heart size is upper limits of normal but stable. Surgical changes in the cervical spine. Old left fifth rib fracture. IMPRESSION: Persistent bilateral parenchymal lung densities that are suggestive for pneumonia and/or edema. Densities in the right lower chest are suggestive for pleural fluid with compressive atelectasis. Can not exclude enlargement of the right pleural effusion. Electronically Signed   By: Markus Daft M.D.   On: 06/07/2018 08:09   Ct Chest Wo Contrast  Result Date: 06/05/2018 CLINICAL  DATA:  Dyspnea starting at 1800 hours EXAM: CT CHEST WITHOUT CONTRAST TECHNIQUE: Multidetector CT imaging of the chest was performed following the standard protocol without IV contrast. COMPARISON:  Same day CXR and 04/25/2018. Chest CT 01/24/2014 FINDINGS: Cardiovascular: Cardiomegaly without pericardial effusion. Small pericardial lymph nodes measuring 9 mm short axis is identified anteriorly, series 2/80 and 9 mm series 2/89 are noted. Mitral annular calcifications are identified in addition to left main and three-vessel coronary arteriosclerosis. Mild-to-moderate aortic atherosclerosis without aneurysm is identified. Dilatation of the main pulmonary artery to 3.1 cm consistent with chronic pulmonary hypertension is identified. Common branch point for the brachiocephalic and left common carotid arteries. Atherosclerosis is noted at the origin of the left subclavian artery. Mediastinum/Nodes: Calcified mediastinal and hilar lymph nodes compatible with old granulomatous disease. No adenopathy. Trachea and mainstem bronchi are patent. Air-filled esophagus without mural thickening is noted. No thyromegaly or mass. Lungs/Pleura: Moderate right and small left pleural effusions are identified. Patchy airspace disease in the left upper lobe, lingula and both lower lobes may represent stigmata of CHF and/or pneumonia. Follow-up to resolution is recommended. Redemonstration of small pulmonary nodules, the largest currently up to 5 mm in the right middle and lower lobes noted, some which appears stable dating back to 2015. Upper Abdomen: Calcified granulomata of the spleen. Mild thickening of the adrenal glands bilaterally query hyperplasia. Atrophy of the pancreas. No acute abnormality within the upper abdomen. Musculoskeletal: No chest wall mass or suspicious bone lesions identified. IMPRESSION: 1. Moderate right and small left pleural effusions with patchy airspace disease in the left upper lobe, lingula and both lower  lobes which may represent stigmata of CHF and/or pneumonia. Follow-up to resolution is recommended. 2. 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 Small pulmonary nodules are identified on the right measuring up to 5 mm, some which appears stable dating back to 2015 and may reflect old granulomatous disease as well given the mediastinal and hilar calcified lymph nodes. 3. Dilatation of the main pulmonary artery to 3.1 cm consistent with chronic pulmonary hypertension. 4. Mild thickening of the adrenal glands bilaterally query hyperplasia. 5. Left main and three-vessel coronary arteriosclerosis. Aortic Atherosclerosis (ICD10-I70.0). Electronically Signed   By: Ashley Royalty M.D.   On: 06/05/2018 23:12   Dg Chest Portable 1 View  Result Date: 06/05/2018 CLINICAL DATA:  Shortness of Breath EXAM: PORTABLE CHEST 1 VIEW COMPARISON:  04/25/2018 FINDINGS: Cardiac shadow is mildly enlarged but stable. Increasing consolidation in the right lung base is noted. Central vascular congestion with interstitial edema is noted as well. No bony abnormality is seen. IMPRESSION:  Stable changes of CHF. New right basilar consolidation. Electronically Signed   By: Inez Catalina M.D.   On: 06/05/2018 20:21    Cardiac Studies   Echo on 06/06/2008:  - Left ventricle: The cavity size was normal. There was moderate   concentric hypertrophy. Systolic function was normal. The   estimated ejection fraction was in the range of 60% to 65%. Wall   motion was normal; there were no regional wall motion   abnormalities. There was a reduced contribution of atrial   contraction to ventricular filling, due to increased ventricular   diastolic pressure. - Mitral valve: Calcified annulus. - Atrial septum: No defect or patent foramen ovale was identified. - Pericardium, extracardiac: A trivial pericardial effusion was   identified.  Impressions:  - No  significant change from prior.  Patient Profile     78 y.o. female with past medical history significant for CAD status post PCI, diastolic heart failure, sarcoidosis, prior history of CVA, CKD stage III, diabetes, sleep apnea presents to hospital from home secondary to worsening shortness of breath.   Assessment & Plan     1.  Acute on chronic diastolic CHF exacerbation-started on IV Lasix, not on home oxygen currently requiring 2-3L oxygen acutely -Initially needed BiPAP as well. -Echocardiogram with diastolic dysfunction, EF of 60%. -Continue to wean off oxygen as tolerated. -Shortness of breath seems to be out of proportion to her heart failure.  Some of her symptoms might be related to right-sided pleural effusion.  2.  Right-sided pleural effusion: I reviewed her chest x-ray done today which shows enlarging effusion on the right side.  I discussed with Dr. Tressia Miners and recommend proceeding with diagnostic and therapeutic thoracentesis.  This is planned for today.  3.  CKD stage III-stable at this time.  Continue to monitor while on diuretics.  4.  CAD-stable: Borderline elevated troponin likely supply demand ischemia.  I recommend continuing medical therapy.  She is already on dual antiplatelet therapy.   4.    Essential hypertension: Blood pressure is still not controlled but propranolol was resumed today which is being used for essential tremors.  She is also on amlodipine, hydralazine and Imdur.       For questions or updates, please contact [place] Please consult [email or web address] for contact info under        Signed, Kathlyn Sacramento, MD  06/07/2018, 9:56 AM

## 2018-06-07 NOTE — Plan of Care (Signed)
Rounded with MD, able to wean off o2, pt too weak to ambulate but has been walking to bathroom okay with 1 assist, pt able to wean off O2, O sats 99% on R/A. Will continue to monitor.  Problem: Health Behavior/Discharge Planning: Goal: Ability to manage health-related needs will improve Outcome: Progressing   Problem: Education: Goal: Ability to demonstrate management of disease process will improve Outcome: Progressing   Problem: Cardiac: Goal: Ability to achieve and maintain adequate cardiopulmonary perfusion will improve Outcome: Progressing

## 2018-06-07 NOTE — Telephone Encounter (Signed)
Copied from [place]. Topic: Quick Communication - Home Health Verbal Orders >> Jun 07, 2018  3:49 PM Margot Ables wrote: Caller/Agency: Ander Gaster w/[facility] Callback Number: [phone], secure VM if not available Requesting OT/PT/Skilled Nursing/Social Work: SN Frequency: pt went to hospital for weakness Sunday night and was admitted - pt has missed visit for today for SN

## 2018-06-07 NOTE — Progress Notes (Signed)
[place] [facility] [place] [name]    MR#:  [id number]  DATE OF BIRTH:  1941/03/15  SUBJECTIVE:  CHIEF COMPLAINT:   Chief Complaint  Patient presents with  . Shortness of Breath    REVIEW OF SYSTEMS:  ROS  DRUG ALLERGIES:   Allergies  Allergen Reactions  . Cephalexin Itching  . Contrast Media [Iodinated Diagnostic Agents] Itching  . Doxycycline Itching  . Gabapentin Itching  . Sulfa Antibiotics Itching    VITALS:  Blood pressure (!) 160/53, pulse 90, temperature 99 F (37.2 C), temperature source Oral, resp. rate 18, height 5\' 7"  (1.702 m), weight 84.3 kg, SpO2 94 %.  PHYSICAL EXAMINATION:  Physical Exam  GENERAL:  78 y.o.-year-old elderly patient lying in the bed with no acute distress.  EYES: Pupils equal, round, reactive to light and accommodation. No scleral icterus. Extraocular muscles intact.  HEENT: Head atraumatic, normocephalic. Has central tremors. Oropharynx and nasopharynx clear.  NECK:  Supple, no jugular venous distention. No thyroid enlargement, no tenderness.  LUNGS: Normal breath sounds bilaterally, no wheezing, rales,rhonchi or crepitation. No use of accessory muscles of respiration.  Decreased bibasilar breath sounds CARDIOVASCULAR: S1, S2 normal. No  rubs, or gallops.  2/6 systolic murmur is present ABDOMEN: Soft, nontender, nondistended. Bowel sounds present. No organomegaly or mass.  EXTREMITIES: No  cyanosis, or clubbing.  1+ pedal edema noted NEUROLOGIC: Cranial nerves II through XII are intact. Muscle strength 5/5 in all extremities. Sensation intact. Gait not checked.  PSYCHIATRIC: The patient is alert and oriented x 3.  SKIN: No obvious rash, lesion, or ulcer.    LABORATORY PANEL:   CBC Recent Labs  Lab 06/07/18 0422  WBC 7.3  HGB 9.7*  HCT 31.1*  PLT 408*    ------------------------------------------------------------------------------------------------------------------  Chemistries  Recent Labs  Lab 06/06/18 0431 06/07/18 0422  NA 140 139  K 3.8 3.5  CL 107 106  CO2 27 27  GLUCOSE 136* 184*  BUN 13 14  CREATININE 1.50* 1.50*  CALCIUM 8.3* 8.2*  MG 2.0  --    ------------------------------------------------------------------------------------------------------------------  Cardiac Enzymes Recent Labs  Lab 06/06/18 0431  TROPONINI 0.06*   ------------------------------------------------------------------------------------------------------------------  RADIOLOGY:  Dg Chest 2 View  Result Date: 06/07/2018 CLINICAL DATA:  78 year old with acute respiratory failure. EXAM: CHEST - 2 VIEW COMPARISON:  Chest radiograph and CT from 06/05/2017 FINDINGS: Patchy parenchymal densities in both lungs, left side greater than right. Again noted are right basilar densities compatible with pleural effusion and atelectasis. Right pleural effusion may have enlarged. Heart size is upper limits of normal but stable. Surgical changes in the cervical spine. Old left fifth rib fracture. IMPRESSION: Persistent bilateral parenchymal lung densities that are suggestive for pneumonia and/or edema. Densities in the right lower chest are suggestive for pleural fluid with compressive atelectasis. Can not exclude enlargement of the right pleural effusion. Electronically Signed   By: Markus Daft M.D.   On: 06/07/2018 08:09   Ct Chest Wo Contrast  Result Date: 06/05/2018 CLINICAL DATA:  Dyspnea starting at 1800 hours EXAM: CT CHEST WITHOUT CONTRAST TECHNIQUE: Multidetector CT imaging of the chest was performed following the standard protocol without IV contrast. COMPARISON:  Same day CXR and 04/25/2018. Chest CT 01/24/2014 FINDINGS: Cardiovascular: Cardiomegaly without pericardial effusion. Small pericardial lymph nodes measuring 9 mm short axis is identified  anteriorly, series 2/80 and 9 mm series 2/89 are noted. Mitral annular calcifications are identified in addition to left main and three-vessel coronary arteriosclerosis.  Mild-to-moderate aortic atherosclerosis without aneurysm is identified. Dilatation of the main pulmonary artery to 3.1 cm consistent with chronic pulmonary hypertension is identified. Common branch point for the brachiocephalic and left common carotid arteries. Atherosclerosis is noted at the origin of the left subclavian artery. Mediastinum/Nodes: Calcified mediastinal and hilar lymph nodes compatible with old granulomatous disease. No adenopathy. Trachea and mainstem bronchi are patent. Air-filled esophagus without mural thickening is noted. No thyromegaly or mass. Lungs/Pleura: Moderate right and small left pleural effusions are identified. Patchy airspace disease in the left upper lobe, lingula and both lower lobes may represent stigmata of CHF and/or pneumonia. Follow-up to resolution is recommended. Redemonstration of small pulmonary nodules, the largest currently up to 5 mm in the right middle and lower lobes noted, some which appears stable dating back to 2015. Upper Abdomen: Calcified granulomata of the spleen. Mild thickening of the adrenal glands bilaterally query hyperplasia. Atrophy of the pancreas. No acute abnormality within the upper abdomen. Musculoskeletal: No chest wall mass or suspicious bone lesions identified. IMPRESSION: 1. Moderate right and small left pleural effusions with patchy airspace disease in the left upper lobe, lingula and both lower lobes which may represent stigmata of CHF and/or pneumonia. Follow-up to resolution is recommended. 2. 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 Small pulmonary nodules are identified on the right measuring up to 5 mm, some which appears stable dating back to 2015 and may reflect old granulomatous  disease as well given the mediastinal and hilar calcified lymph nodes. 3. Dilatation of the main pulmonary artery to 3.1 cm consistent with chronic pulmonary hypertension. 4. Mild thickening of the adrenal glands bilaterally query hyperplasia. 5. Left main and three-vessel coronary arteriosclerosis. Aortic Atherosclerosis (ICD10-I70.0). Electronically Signed   By: Ashley Royalty M.D.   On: 06/05/2018 23:12   Dg Chest Portable 1 View  Result Date: 06/05/2018 CLINICAL DATA:  Shortness of Breath EXAM: PORTABLE CHEST 1 VIEW COMPARISON:  04/25/2018 FINDINGS: Cardiac shadow is mildly enlarged but stable. Increasing consolidation in the right lung base is noted. Central vascular congestion with interstitial edema is noted as well. No bony abnormality is seen. IMPRESSION: Stable changes of CHF. New right basilar consolidation. Electronically Signed   By: Inez Catalina M.D.   On: 06/05/2018 20:21    EKG:   Orders placed or performed during the hospital encounter of 06/05/18  . EKG 12-Lead  . EKG 12-Lead  . ED EKG  . ED EKG  . EKG 12-Lead  . EKG 12-Lead   *Note: Due to a large number of results and/or encounters for the requested time period, some results have not been displayed. A complete set of results can be found in Results Review.    ASSESSMENT AND PLAN:   78 year old female with past medical history significant for CAD status post PCI, diastolic heart failure, sarcoidosis, prior history of CVA, CKD stage III, diabetes, sleep apnea presents to hospital from home secondary to worsening shortness of breath.  1.  Acute on chronic diastolic CHF exacerbation-started on IV Lasix, not on home oxygen currently requiring 2-3L oxygen acutely -Initially needed BiPAP as well. -Continue diuresis and strict input and output monitoring, daily weights -Echocardiogram with diastolic dysfunction, EF of 60%. -Dietary restriction, fluid restriction and CHF clinic follow-up at discharge. -Continue to wean off oxygen  as tolerated. -Chest x-ray with right pleural effusion.  Check with radiology to see if she can have pleural tap done today  2.  CKD stage III-stable at this time.  Continue to monitor while on diuretics.  3.  CAD-stable, no chest pain.  Appreciate cardiology consult.  Continue aspirin, Brilinta, statin  4.  Essential tremors-restarted propranolol  5.  Hypertension-on propranolol, Imdur and Norvasc  6.  DVT prophylaxis-on Lovenox  Physical therapy recommended rehab.  Social worker consult for the same     All the records are reviewed and case discussed with Care Management/Social Workerr. Management plans discussed with the patient, family and they are in agreement.  CODE STATUS: Full Code  TOTAL TIME TAKING CARE OF THIS PATIENT: 38 minutes.   POSSIBLE D/C IN 2 DAYS, DEPENDING ON CLINICAL CONDITION.   Gladstone Lighter M.D on 06/07/2018 at 9:15 AM  Between 7am to 6pm - Pager - [phone]  After 6pm go to [email or web address] - password [email or web address] [facility]  Office  [phone]  CC: Primary care physician; [name], MD

## 2018-06-07 NOTE — Procedures (Signed)
Interventional Radiology Procedure Note  Procedure: US guided right thoracentesis  Complications: None  Estimated Blood Loss: None  Findings: 750 mL of amber colored fluid removed from right pleural space. Post CXR pending.  Venetia Night. Kathlene Cote, M.D Pager:  [phone]

## 2018-06-07 NOTE — NC FL2 (Signed)
[place] LEVEL OF CARE SCREENING TOOL     IDENTIFICATION  Patient [name] Birthdate: May 31, 1940 Sex: female Admission Date (Current Location): 06/05/2018  [place] and [place] Number:  Engineering geologist and Address:  [facility], [address] [place], [place] [address]      Provider Number: [id number]  Attending Physician Name and Address:  Gladstone Lighter, MD  Relative Name and Phone Number:  Tavie, Haseman [phone]  [phone] or Corbett,Joann Relative [phone]  or Graves,Felecia Niece   [phone]     Current Level of Care: Hospital Recommended Level of Care: [place] Prior Approval Number:    Date Approved/Denied:   PASRR Number: [id number]  Discharge Plan: SNF    Current Diagnoses: Patient Active Problem List   Diagnosis Date Noted  . Acute pulmonary edema (HCC)   . HCAP (healthcare-associated pneumonia) 06/05/2018  . Decubitus ulcer of sacral region, stage 1 06/02/2018  . Lymphedema 05/25/2018  . Acute on chronic diastolic heart failure ([place]) 04/25/2018  . Atherosclerosis of both carotid arteries 04/18/2018  . Near syncope 03/19/2018  . Functional diarrhea 03/13/2018  . Vitamin a deficiency, unspecified 02/22/2018  . Hypercalcemia 02/20/2018  . Environmental and seasonal allergies 01/15/2018  . Post-nasal drip 01/15/2018  . Restless legs 10/17/2017  . Normocytic anemia, not due to blood loss 09/27/2017  . Melena 09/25/2017  . Cognitive decline 08/16/2017  . Polypharmacy 07/28/2017  . Pneumonia 05/26/2017  . Pill esophagitis 05/23/2017  . CKD (chronic kidney disease) stage 3, GFR 30-59 ml/min (HCC) 11/15/2016  . History of fall 11/12/2016  . Osteoarthritis 11/10/2016  . Cervical stenosis of spinal canal 10/10/2016  . Cervical radiculopathy at C7 07/02/2016  . Epigastric hernia 04/01/2016  . Coronary artery disease   . Type 2 DM with CKD stage 3 and  hypertension ([place])   . Chronic renal insufficiency   . Hypertensive heart disease   . OSA on CPAP 07/07/2015  . Hospital discharge follow-up 07/07/2015  . Pleural effusion, right   . Chronic diastolic CHF (congestive heart failure) ([place]) 04/18/2015  . Diabetic neuropathy ([place]) 02/21/2015  . Benign neoplasm of sigmoid colon   . Acute respiratory failure with hypoxia ([place]) 11/21/2014  . Low back pain with radiation 04/04/2014  . Essential hypertension 04/04/2014  . Hyperlipidemia LDL goal <70 02/23/2014  . Long-term use of high-risk medication 02/23/2014  . Other long term (current) drug therapy 02/23/2014  . Dermatophytic onychia 08/17/2013  . Stable angina ([place]) 08/17/2013  . Cerebral infarct ([place]) 08/17/2013  . Bony exostosis 08/17/2013  . Obesity, unspecified 08/17/2013  . Arthritis 08/05/2013  . Unsteadiness on feet 03/24/2013  . History of colonic polyps 02/20/2013  . Sarcoidosis ([place]) 12/07/2012  . Dysphagia 12/07/2012  . Pulmonary nodule seen on imaging study 05/20/2012  . 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 Diabetic nephropathy associated with type 2 diabetes mellitus ([place]) 02/29/2012  . Cardiomyopathy, ischemic   . Hepatic steatosis   . Osteoporosis, post-menopausal   . Peripheral vascular disease due to secondary diabetes mellitus ([place])   . Double vessel coronary artery disease 12/14/2011    Orientation RESPIRATION BLADDER Height & Weight     Self  O2(2L) Continent Weight: 185 lb 13.6 oz (84.3 kg) Height:  5\' 7"  (170.2 cm)  BEHAVIORAL SYMPTOMS/MOOD NEUROLOGICAL BOWEL NUTRITION STATUS      Continent Diet(Carb Modified)  AMBULATORY STATUS COMMUNICATION OF NEEDS Skin   Limited Assist Verbally Normal  Personal Care Assistance Level of Assistance  Bathing, Feeding, Dressing Bathing Assistance: Limited assistance Feeding assistance: Independent Dressing Assistance: Limited assistance     Functional Limitations Info  Sight, Hearing,  Speech Sight Info: Adequate Hearing Info: Adequate Speech Info: Adequate    SPECIAL CARE FACTORS FREQUENCY  PT (By licensed PT), OT (By licensed OT)     PT Frequency: 5x a week OT Frequency: 5x a week            Contractures Contractures Info: Not present    Additional Factors Info  Allergies, Psychotropic, Insulin Sliding Scale   Allergies Info: CEPHALEXIN, CONTRAST MEDIA IODINATED DIAGNOSTIC AGENTS, DOXYCYCLINE, GABAPENTIN, SULFA ANTIBIOTICS  Psychotropic Info: ALPRAZolam (XANAX) tablet 0.25 mg  Insulin Sliding Scale Info: insulin aspart (novoLOG) injection 0-9 Units 3x a day with meals       Current Medications (06/07/2018):  This is the current hospital active medication list Current Facility-Administered Medications  Medication Dose Route Frequency Provider Last Rate Last Dose  . acetaminophen (TYLENOL) tablet 650 mg  650 mg Oral Q6H PRN Lance Coon, MD   650 mg at 06/06/18 2127   Or  . acetaminophen (TYLENOL) suppository 650 mg  650 mg Rectal Q6H PRN Lance Coon, MD      . ALPRAZolam [name]) tablet 0.25 mg  0.25 mg Oral Daily Lance Coon, MD   0.25 mg at 06/07/18 1000  . amLODipine (NORVASC) tablet 10 mg  10 mg Oral Daily Lance Coon, MD   10 mg at 06/07/18 1000  . aspirin EC tablet 81 mg  81 mg Oral Daily Lance Coon, MD   81 mg at 06/07/18 1147  . benzonatate (TESSALON) capsule 200 mg  200 mg Oral TID Gladstone Lighter, MD      . chlorhexidine (PERIDEX) 0.12 % solution 15 mL  15 mL Mouth Rinse BID Tukov-Yual, Magdalene S, NP   15 mL at 06/07/18 1001  . donepezil (ARICEPT) tablet 10 mg  10 mg Oral Corwin Levins, MD   10 mg at 06/06/18 2202  . enoxaparin (LOVENOX) injection 40 mg  40 mg Subcutaneous Q24H Lance Coon, MD   40 mg at 06/06/18 2126  . furosemide (LASIX) injection 20 mg  20 mg Intravenous BID Gladstone Lighter, MD   20 mg at 06/07/18 1004  . guaiFENesin-dextromethorphan (ROBITUSSIN DM) 100-10 MG/5ML syrup 5 mL  5 mL Oral Q4H PRN Lance Coon, MD   5 mL at 06/07/18 1000  . hydrALAZINE (APRESOLINE) tablet 100 mg  100 mg Oral Q8H Lance Coon, MD   100 mg at 06/07/18 1325  . insulin aspart (novoLOG) injection 0-5 Units  0-5 Units Subcutaneous QHS Lance Coon, MD      . insulin aspart (novoLOG) injection 0-9 Units  0-9 Units Subcutaneous TID WC Lance Coon, MD   3 Units at 06/07/18 1146  . insulin aspart protamine- aspart (NOVOLOG MIX 70/30) injection 15 Units  15 Units Subcutaneous BID WC Gladstone Lighter, MD      . ipratropium-albuterol (DUONEB) 0.5-2.5 (3) MG/3ML nebulizer solution 3 mL  3 mL Nebulization Q4H PRN Lance Coon, MD      . isosorbide mononitrate (IMDUR) 24 hr tablet 30 mg  30 mg Oral Daily Lance Coon, MD   30 mg at 06/07/18 1001  . mometasone-formoterol (DULERA) 200-5 MCG/ACT inhaler 2 puff  2 puff Inhalation BID Gladstone Lighter, MD      . nitroGLYCERIN (NITROSTAT) SL tablet 0.4 mg  0.4 mg Sublingual Q5 min PRN Tukov-Yual, Arlyss Gandy, NP  0.4 mg at 06/06/18 0456  . ondansetron (ZOFRAN) tablet 4 mg  4 mg Oral Q6H PRN Lance Coon, MD       Or  . ondansetron [facility]) injection 4 mg  4 mg Intravenous Q6H PRN Lance Coon, MD      . Derrill Memo ON 06/08/2018] predniSONE (DELTASONE) tablet 10 mg  10 mg Oral Q breakfast Gladstone Lighter, MD      . pregabalin (LYRICA) capsule 100 mg  100 mg Oral BID Gladstone Lighter, MD      . propranolol (INDERAL) tablet 10 mg  10 mg Oral TID Gladstone Lighter, MD   10 mg at 06/07/18 1000  . rosuvastatin (CRESTOR) tablet 20 mg  20 mg Oral Daily Lance Coon, MD   20 mg at 06/07/18 1000  . ticagrelor (BRILINTA) tablet 60 mg  60 mg Oral BID Lance Coon, MD   60 mg at 06/07/18 1325   Facility-Administered Medications Ordered in Other Encounters  Medication Dose Route Frequency Provider Last Rate Last Dose  . albuterol (PROVENTIL) (5 MG/ML) 0.5% nebulizer solution 2.5 mg  2.5 mg Nebulization Once [name], MD         Discharge Medications: Please see discharge  summary for a list of discharge medications.  Relevant Imaging Results:  Relevant Lab Results:   Additional Information SSN: [id number]  Anell Barr

## 2018-06-07 NOTE — Progress Notes (Signed)
Pt complaining of diarrhea, has had two loose bowel movements, Dr. Viann Shove notified and received verbal orders for Imodium PRN. Will give and continue to monitor.

## 2018-06-08 DIAGNOSIS — E1351 Other specified diabetes mellitus with diabetic peripheral angiopathy without gangrene: Secondary | ICD-10-CM | POA: Diagnosis not present

## 2018-06-08 DIAGNOSIS — I509 Heart failure, unspecified: Secondary | ICD-10-CM | POA: Diagnosis present

## 2018-06-08 DIAGNOSIS — J984 Other disorders of lung: Secondary | ICD-10-CM | POA: Diagnosis not present

## 2018-06-08 DIAGNOSIS — D631 Anemia in chronic kidney disease: Secondary | ICD-10-CM

## 2018-06-08 DIAGNOSIS — M810 Age-related osteoporosis without current pathological fracture: Secondary | ICD-10-CM

## 2018-06-08 DIAGNOSIS — I5032 Chronic diastolic (congestive) heart failure: Secondary | ICD-10-CM | POA: Diagnosis not present

## 2018-06-08 DIAGNOSIS — F419 Anxiety disorder, unspecified: Secondary | ICD-10-CM | POA: Diagnosis not present

## 2018-06-08 DIAGNOSIS — I959 Hypotension, unspecified: Secondary | ICD-10-CM | POA: Diagnosis not present

## 2018-06-08 DIAGNOSIS — G4733 Obstructive sleep apnea (adult) (pediatric): Secondary | ICD-10-CM | POA: Diagnosis not present

## 2018-06-08 DIAGNOSIS — J90 Pleural effusion, not elsewhere classified: Secondary | ICD-10-CM

## 2018-06-08 DIAGNOSIS — D860 Sarcoidosis of lung: Secondary | ICD-10-CM

## 2018-06-08 DIAGNOSIS — J9601 Acute respiratory failure with hypoxia: Secondary | ICD-10-CM | POA: Diagnosis not present

## 2018-06-08 DIAGNOSIS — I255 Ischemic cardiomyopathy: Secondary | ICD-10-CM | POA: Diagnosis not present

## 2018-06-08 DIAGNOSIS — D869 Sarcoidosis, unspecified: Secondary | ICD-10-CM

## 2018-06-08 DIAGNOSIS — Z7952 Long term (current) use of systemic steroids: Secondary | ICD-10-CM | POA: Diagnosis not present

## 2018-06-08 DIAGNOSIS — M1990 Unspecified osteoarthritis, unspecified site: Secondary | ICD-10-CM | POA: Diagnosis not present

## 2018-06-08 DIAGNOSIS — R911 Solitary pulmonary nodule: Secondary | ICD-10-CM

## 2018-06-08 DIAGNOSIS — E509 Vitamin A deficiency, unspecified: Secondary | ICD-10-CM

## 2018-06-08 DIAGNOSIS — E1122 Type 2 diabetes mellitus with diabetic chronic kidney disease: Secondary | ICD-10-CM | POA: Diagnosis not present

## 2018-06-08 DIAGNOSIS — R278 Other lack of coordination: Secondary | ICD-10-CM | POA: Diagnosis not present

## 2018-06-08 DIAGNOSIS — Z8673 Personal history of transient ischemic attack (TIA), and cerebral infarction without residual deficits: Secondary | ICD-10-CM | POA: Diagnosis not present

## 2018-06-08 DIAGNOSIS — Z0279 Encounter for issue of other medical certificate: Secondary | ICD-10-CM

## 2018-06-08 DIAGNOSIS — Z743 Need for continuous supervision: Secondary | ICD-10-CM | POA: Diagnosis not present

## 2018-06-08 DIAGNOSIS — J9621 Acute and chronic respiratory failure with hypoxia: Secondary | ICD-10-CM

## 2018-06-08 DIAGNOSIS — I130 Hypertensive heart and chronic kidney disease with heart failure and stage 1 through stage 4 chronic kidney disease, or unspecified chronic kidney disease: Secondary | ICD-10-CM | POA: Diagnosis not present

## 2018-06-08 DIAGNOSIS — R279 Unspecified lack of coordination: Secondary | ICD-10-CM | POA: Diagnosis not present

## 2018-06-08 DIAGNOSIS — I890 Lymphedema, not elsewhere classified: Secondary | ICD-10-CM | POA: Diagnosis not present

## 2018-06-08 DIAGNOSIS — Z881 Allergy status to other antibiotic agents status: Secondary | ICD-10-CM | POA: Diagnosis not present

## 2018-06-08 DIAGNOSIS — E785 Hyperlipidemia, unspecified: Secondary | ICD-10-CM | POA: Diagnosis not present

## 2018-06-08 DIAGNOSIS — Z79899 Other long term (current) drug therapy: Secondary | ICD-10-CM | POA: Diagnosis not present

## 2018-06-08 DIAGNOSIS — Z87440 Personal history of urinary (tract) infections: Secondary | ICD-10-CM

## 2018-06-08 DIAGNOSIS — J449 Chronic obstructive pulmonary disease, unspecified: Secondary | ICD-10-CM | POA: Diagnosis not present

## 2018-06-08 DIAGNOSIS — E1151 Type 2 diabetes mellitus with diabetic peripheral angiopathy without gangrene: Secondary | ICD-10-CM | POA: Diagnosis not present

## 2018-06-08 DIAGNOSIS — Z7982 Long term (current) use of aspirin: Secondary | ICD-10-CM | POA: Diagnosis not present

## 2018-06-08 DIAGNOSIS — F418 Other specified anxiety disorders: Secondary | ICD-10-CM

## 2018-06-08 DIAGNOSIS — E1149 Type 2 diabetes mellitus with other diabetic neurological complication: Secondary | ICD-10-CM | POA: Diagnosis not present

## 2018-06-08 DIAGNOSIS — Z794 Long term (current) use of insulin: Secondary | ICD-10-CM | POA: Diagnosis not present

## 2018-06-08 DIAGNOSIS — M5412 Radiculopathy, cervical region: Secondary | ICD-10-CM

## 2018-06-08 DIAGNOSIS — N183 Chronic kidney disease, stage 3 (moderate): Secondary | ICD-10-CM | POA: Diagnosis not present

## 2018-06-08 DIAGNOSIS — Z87442 Personal history of urinary calculi: Secondary | ICD-10-CM | POA: Diagnosis not present

## 2018-06-08 DIAGNOSIS — M6281 Muscle weakness (generalized): Secondary | ICD-10-CM | POA: Diagnosis not present

## 2018-06-08 DIAGNOSIS — R5381 Other malaise: Secondary | ICD-10-CM | POA: Diagnosis not present

## 2018-06-08 DIAGNOSIS — E1140 Type 2 diabetes mellitus with diabetic neuropathy, unspecified: Secondary | ICD-10-CM

## 2018-06-08 DIAGNOSIS — M4802 Spinal stenosis, cervical region: Secondary | ICD-10-CM

## 2018-06-08 DIAGNOSIS — L84 Corns and callosities: Secondary | ICD-10-CM | POA: Diagnosis not present

## 2018-06-08 DIAGNOSIS — Z888 Allergy status to other drugs, medicaments and biological substances status: Secondary | ICD-10-CM | POA: Diagnosis not present

## 2018-06-08 DIAGNOSIS — Z9181 History of falling: Secondary | ICD-10-CM

## 2018-06-08 DIAGNOSIS — N184 Chronic kidney disease, stage 4 (severe): Secondary | ICD-10-CM

## 2018-06-08 DIAGNOSIS — J810 Acute pulmonary edema: Secondary | ICD-10-CM | POA: Diagnosis not present

## 2018-06-08 DIAGNOSIS — Z7951 Long term (current) use of inhaled steroids: Secondary | ICD-10-CM | POA: Diagnosis not present

## 2018-06-08 DIAGNOSIS — I25118 Atherosclerotic heart disease of native coronary artery with other forms of angina pectoris: Secondary | ICD-10-CM

## 2018-06-08 DIAGNOSIS — I5033 Acute on chronic diastolic (congestive) heart failure: Secondary | ICD-10-CM | POA: Diagnosis not present

## 2018-06-08 DIAGNOSIS — Z981 Arthrodesis status: Secondary | ICD-10-CM

## 2018-06-08 DIAGNOSIS — Z882 Allergy status to sulfonamides status: Secondary | ICD-10-CM | POA: Diagnosis not present

## 2018-06-08 DIAGNOSIS — Z7902 Long term (current) use of antithrombotics/antiplatelets: Secondary | ICD-10-CM | POA: Diagnosis not present

## 2018-06-08 DIAGNOSIS — R0902 Hypoxemia: Secondary | ICD-10-CM | POA: Diagnosis not present

## 2018-06-08 DIAGNOSIS — I2510 Atherosclerotic heart disease of native coronary artery without angina pectoris: Secondary | ICD-10-CM | POA: Diagnosis not present

## 2018-06-08 DIAGNOSIS — I5031 Acute diastolic (congestive) heart failure: Secondary | ICD-10-CM | POA: Diagnosis not present

## 2018-06-08 DIAGNOSIS — J189 Pneumonia, unspecified organism: Secondary | ICD-10-CM | POA: Diagnosis not present

## 2018-06-08 DIAGNOSIS — F0390 Unspecified dementia without behavioral disturbance: Secondary | ICD-10-CM

## 2018-06-08 DIAGNOSIS — I10 Essential (primary) hypertension: Secondary | ICD-10-CM | POA: Diagnosis not present

## 2018-06-08 DIAGNOSIS — Z89421 Acquired absence of other right toe(s): Secondary | ICD-10-CM | POA: Diagnosis not present

## 2018-06-08 LAB — BASIC METABOLIC PANEL
Anion gap: 8 (ref 5–15)
BUN: 13 mg/dL (ref 8–23)
CO2: 29 mmol/L (ref 22–32)
Calcium: 8.5 mg/dL — ABNORMAL LOW (ref 8.9–10.3)
Chloride: 103 mmol/L (ref 98–111)
Creatinine, Ser: 1.46 mg/dL — ABNORMAL HIGH (ref 0.44–1.00)
GFR calc Af Amer: 40 mL/min — ABNORMAL LOW (ref >60)
GFR calc non Af Amer: 34 mL/min — ABNORMAL LOW (ref >60)
Glucose, Bld: 131 mg/dL — ABNORMAL HIGH (ref 70–99)
Potassium: 3.2 mmol/L — ABNORMAL LOW (ref 3.5–5.1)
Sodium: 140 mmol/L (ref 135–145)

## 2018-06-08 LAB — GLUCOSE, CAPILLARY
GLUCOSE-CAPILLARY: 131 mg/dL — AB (ref 70–99)
GLUCOSE-CAPILLARY: 204 mg/dL — AB (ref 70–99)

## 2018-06-08 MED ORDER — ALPRAZOLAM 0.25 MG PO TABS: 0.2500 mg | ORAL_TABLET | Freq: Every day | ORAL | 0 refills | Status: DC

## 2018-06-08 MED ORDER — DIPHENOXYLATE-ATROPINE 2.5-0.025 MG PO TABS: 1.0000 | ORAL_TABLET | Freq: Four times a day (QID) | ORAL | 0 refills | Status: DC | PRN

## 2018-06-08 MED ORDER — FUROSEMIDE 20 MG PO TABS: 20.0000 mg | ORAL_TABLET | Freq: Every day | ORAL | 3 refills | Status: DC

## 2018-06-08 MED ORDER — POTASSIUM CHLORIDE CRYS ER 20 MEQ PO TBCR: 40.0000 meq | EXTENDED_RELEASE_TABLET | Freq: Once | ORAL | Status: DC

## 2018-06-08 MED ORDER — LOPERAMIDE HCL 2 MG PO CAPS: 2.0000 mg | ORAL_CAPSULE | Freq: Four times a day (QID) | ORAL | 0 refills | Status: DC | PRN

## 2018-06-08 MED ORDER — POTASSIUM CHLORIDE ER 20 MEQ PO TBCR: 20.0000 meq | EXTENDED_RELEASE_TABLET | Freq: Every day | ORAL | 0 refills | Status: DC

## 2018-06-08 MED ORDER — PROPRANOLOL HCL 10 MG PO TABS: 10.0000 mg | ORAL_TABLET | Freq: Three times a day (TID) | ORAL | 1 refills | Status: DC

## 2018-06-08 MED ORDER — DIPHENOXYLATE-ATROPINE 2.5-0.025 MG PO TABS: 1.0000 | ORAL_TABLET | Freq: Four times a day (QID) | ORAL | 0 refills | Status: AC | PRN

## 2018-06-08 MED ORDER — INSULIN ASPART PROT & ASPART (70-30 MIX) 100 UNIT/ML ~~LOC~~ SUSP: 18.0000 [IU] | Freq: Two times a day (BID) | SUBCUTANEOUS | 11 refills | Status: DC

## 2018-06-08 MED ORDER — IPRATROPIUM-ALBUTEROL 0.5-2.5 (3) MG/3ML IN SOLN: 3.0000 mL | Freq: Four times a day (QID) | RESPIRATORY_TRACT | 0 refills | Status: DC | PRN

## 2018-06-08 NOTE — Care Management Important Message (Signed)
Copy of signed Medicare IM left with patient in room. 

## 2018-06-08 NOTE — Progress Notes (Signed)
Progress Note  Patient [name] Date of Encounter: 06/08/2018  Primary Cardiologist: Dr. Rockey Situ  Subjective   Shortness of breath improved Reports having episode of diarrhea.  Has episode of diarrhea approximately once per month, chronic issue Fleeting chest pain, sharp did not last for long, " split-second"  X-ray reviewed showing resolved pleural effusion on right  Inpatient Medications    Scheduled Meds: . ALPRAZolam  0.25 mg Oral Daily  . amLODipine  10 mg Oral Daily  . aspirin EC  81 mg Oral Daily  . benzonatate  200 mg Oral TID  . chlorhexidine  15 mL Mouth Rinse BID  . donepezil  10 mg Oral QHS  . enoxaparin (LOVENOX) injection  40 mg Subcutaneous Q24H  . furosemide  20 mg Intravenous BID  . hydrALAZINE  100 mg Oral Q8H  . insulin aspart  0-5 Units Subcutaneous QHS  . insulin aspart  0-9 Units Subcutaneous TID WC  . insulin aspart protamine- aspart  15 Units Subcutaneous BID WC  . isosorbide mononitrate  30 mg Oral Daily  . mometasone-formoterol  2 puff Inhalation BID  . predniSONE  10 mg Oral Q breakfast  . pregabalin  100 mg Oral BID  . propranolol  10 mg Oral TID  . rosuvastatin  20 mg Oral Daily  . ticagrelor  60 mg Oral BID   Continuous Infusions:  PRN Meds: acetaminophen **OR** acetaminophen, guaiFENesin-dextromethorphan, ipratropium-albuterol, loperamide, nitroGLYCERIN, ondansetron **OR** ondansetron (ZOFRAN) IV   Vital Signs    Vitals:   06/07/18 2010 06/08/18 0419 06/08/18 0426 06/08/18 0723  BP: (!) 127/40  (!) 152/53 (!) 130/48  Pulse: 72  72 70  Resp: 20  18 19   Temp: 99.5 F (37.5 C)  99.7 F (37.6 C) 98.7 F (37.1 C)  TempSrc: Oral  Oral Oral  SpO2: 91%  94% 90%  Weight:  80.2 kg    Height:        Intake/Output Summary (Last 24 hours) at 06/08/2018 1139 Last data filed at 06/08/2018 1022 Gross per 24 hour  Intake 360 ml  Output -  Net 360 ml   Last 3 Weights 06/08/2018 06/06/2018 06/05/2018  Weight (lbs) 176 lb 14.4  oz 185 lb 13.6 oz 188 lb 9.6 oz  Weight (kg) 80.241 kg 84.3 kg 85.548 kg      Telemetry    NSR - Personally Reviewed  ECG    Not done today - Personally Reviewed  Physical Exam   Constitutional:  oriented to person, place, and time. No distress.  HENT:  Head: Grossly normal Eyes:  no discharge. No scleral icterus.  Neck: No JVD, no carotid bruits  Cardiovascular: Regular rate and rhythm, no murmurs appreciated Pulmonary/Chest: Clear to auscultation bilaterally, no wheezes or rails Abdominal: Soft.  no distension.  no tenderness.  Musculoskeletal: Normal range of motion Neurological:  normal muscle tone. Coordination normal. No atrophy Skin: Skin warm and dry Psychiatric: normal affect, pleasant   Labs    Chemistry Recent Labs  Lab 06/06/18 0431 06/07/18 0422 06/08/18 0434  NA 140 139 140  K 3.8 3.5 3.2*  CL 107 106 103  CO2 27 27 29   GLUCOSE 136* 184* 131*  BUN 13 14 13   CREATININE 1.50* 1.50* 1.46*  CALCIUM 8.3* 8.2* 8.5*  GFRNONAA 33* 33* 34*  GFRAA 38* 38* 40*  ANIONGAP 6 6 8      Hematology Recent Labs  Lab 06/05/18 2003 06/06/18 0431 06/07/18 0422  WBC 10.4 7.7 7.3  RBC  3.48* 3.24* 3.44*  HGB 9.9* 9.2* 9.7*  HCT 32.0* 30.0* 31.1*  MCV 92.0 92.6 90.4  MCH 28.4 28.4 28.2  MCHC 30.9 30.7 31.2  RDW 16.3* 16.5* 16.7*  PLT 359 352 408*    Cardiac Enzymes Recent Labs  Lab 06/05/18 2003 06/05/18 2349 06/06/18 0431  TROPONINI 0.06* 0.08* 0.06*   No results for input(s): TROPIPOC in the last 168 hours.   BNPNo results for input(s): BNP, PROBNP in the last 168 hours.   DDimer No results for input(s): DDIMER in the last 168 hours.   Radiology    Dg Chest 1 View  Result Date: 06/07/2018 CLINICAL DATA:  Status post right thoracentesis. EXAM: CHEST  1 VIEW COMPARISON:  06/07/2018, 0700 hours FINDINGS: After thoracentesis, no significant right pleural fluid remains. Improved aeration of the right lung. No pneumothorax. Stable heart size.  IMPRESSION: No significant residual right pleural fluid after thoracentesis. Improved aeration of the right lung noted with no evidence of pneumothorax. Electronically Signed   By: Aletta Edouard M.D.   On: 06/07/2018 11:28   Dg Chest 2 View  Result Date: 06/07/2018 CLINICAL DATA:  78 year old with acute respiratory failure. EXAM: CHEST - 2 VIEW COMPARISON:  Chest radiograph and CT from 06/05/2017 FINDINGS: Patchy parenchymal densities in both lungs, left side greater than right. Again noted are right basilar densities compatible with pleural effusion and atelectasis. Right pleural effusion may have enlarged. Heart size is upper limits of normal but stable. Surgical changes in the cervical spine. Old left fifth rib fracture. IMPRESSION: Persistent bilateral parenchymal lung densities that are suggestive for pneumonia and/or edema. Densities in the right lower chest are suggestive for pleural fluid with compressive atelectasis. Can not exclude enlargement of the right pleural effusion. Electronically Signed   By: Markus Daft M.D.   On: 06/07/2018 08:09   US Thoracentesis Asp Pleural Space W/img Guide  Result Date: 06/07/2018 CLINICAL DATA:  Right pleural effusion. EXAM: ULTRASOUND GUIDED RIGHT THORACENTESIS COMPARISON:  None. PROCEDURE: An ultrasound guided thoracentesis was thoroughly discussed with the patient and questions answered. The benefits, risks, alternatives and complications were also discussed. The patient understands and wishes to proceed with the procedure. Written consent was obtained. A time-out was performed prior to initiating the procedure. Ultrasound was performed to localize and mark an adequate pocket of fluid in the right chest. The area was then prepped and draped in the normal sterile fashion. 1% Lidocaine was used for local anesthesia. Under ultrasound guidance a 6 French Safe-T-Centesis catheter was introduced. Thoracentesis was performed. The catheter was removed and a dressing  applied. COMPLICATIONS: None FINDINGS: A total of approximately 750 mL of amber colored fluid was removed. IMPRESSION: Successful ultrasound guided right thoracentesis yielding 750 mL of pleural fluid. Electronically Signed   By: Aletta Edouard M.D.   On: 06/07/2018 11:27    Cardiac Studies   Echo on 06/06/2008:  - Left ventricle: The cavity size was normal. There was moderate   concentric hypertrophy. Systolic function was normal. The   estimated ejection fraction was in the range of 60% to 65%. Wall   motion was normal; there were no regional wall motion   abnormalities. There was a reduced contribution of atrial   contraction to ventricular filling, due to increased ventricular   diastolic pressure. - Mitral valve: Calcified annulus. - Atrial septum: No defect or patent foramen ovale was identified. - Pericardium, extracardiac: A trivial pericardial effusion was   identified.  Impressions:  - No significant change from  prior.  Patient Profile     78 y.o. female with past medical history significant for CAD status post PCI, diastolic heart failure, sarcoidosis, prior history of CVA, CKD stage III, diabetes, sleep apnea presents to hospital from home secondary to worsening shortness of breath.   Assessment & Plan    1.    Acute respiratory distress Component of acute on chronic diastolic CHF Right pleural effusion also noted History of pulmonary sarcoidosis with moderate airflow restriction, restrictive disease on PFTs dating back to 2014, prior history pleural effusion dating back to 2016 Initially required BiPAP, weaned to nasal cannula oxygen Treated with IV Lasix  Was off oxygen yesterday, placed back on this morning Currently 2 L -Echocardiogram with diastolic dysfunction, EF of 60%. --We will discharge on Lasix 20 mg daily  2.  Right-sided pleural effusion: Resolved effusion after thoracentesis Secondary to diastolic CHF  3.  CKD stage III- Numbers are  stable  4.  CAD-stable:  Known disease with stent to the LAD, circumflex, RCA, numerous previous cardiac catheterizations in 2010, 2011, 2013, 2016 -Having atypical chest pain symptoms, no further ischemic work-up at this time -Could take nitroglycerin for sustained chest pain but her chest pain is fleeting lasting only a second  4.    Essential hypertension:  Continue outpatient medications Systolic pressures 295 up to 130  Case discussed with medicine service  Total encounter time more than 25 minutes  Greater than 50% was spent in counseling and coordination of care with the patient   For questions or updates, please contact [place] Please consult [email or web address] for contact info under        Signed, Ida Rogue, MD  06/08/2018, 11:39 AM

## 2018-06-08 NOTE — Clinical Social Work Note (Signed)
Patient to be d/c'ed today to [facility] room 331A.  Patient and family agreeable to plans will transport via ems RN to call report [phone].  CSW updated patient and her husband [name], that she will be discharging today and will be going to a semi private room.  Evette Cristal, MSW, [place]

## 2018-06-08 NOTE — Clinical Social Work Note (Signed)
Clinical Social Work Assessment  Patient Details  [name] MRN: [id number] Date of Birth: [birth year]  Date of referral:  06/08/18               Reason for consult:  Facility Placement                Permission sought to share information with:  Facility Sport and exercise psychologist, Family Supports Permission granted to share information::  Yes, Verbal Permission Granted  Name::     Pamela, Collier [phone]  [phone]   Agency::  SNF admissions  Relationship::     Contact Information:     Housing/Transportation Living arrangements for the past 2 months:  [place] of Information:  Patient, Spouse Patient Interpreter Needed:  None Criminal Activity/Legal Involvement Pertinent to Current Situation/Hospitalization:  No - Comment as needed Significant Relationships:  Other Family Members, Spouse Lives with:  Spouse Do you feel safe going back to the place where you live?  No Need for family participation in patient care:  Yes (Comment)  Care giving concerns:  Patient and family feel that she needs some short term rehab before she is able to return back home.   Social Worker assessment / plan:  Patient is a 78 year old female who is alert and oriented x3.  Patient lives at home with her husband, the plan is to go to SNF for short term rehab and then return back home.  Patient and husband states they have been at [facility], and would like to return if possible.  CSW explained process and procedure for patient to go to SNF.  CSW explained how insurance will pay for stay at SNF and what to expect at SNF.  Patient and husband did not have any other questions and gave CSW permission to begin bed search in [place].  Employment status:  Retired Forensic scientist:  Medicare PT Recommendations:  [place] / Referral to community resources:  [place]  Patient/Family's Response to care:  Patient and  family are agreeable to going to SNF for short term rehab.  Patient/Family's Understanding of and Emotional Response to Diagnosis, Current Treatment, and Prognosis: Patient and family are hopeful that she does not have to be in SNF for very long.  Emotional Assessment Appearance:  Appears stated age Attitude/Demeanor/Rapport:    Affect (typically observed):  Appropriate Orientation:  Oriented to Self, Oriented to Place, Oriented to Situation Alcohol / Substance use:  Not Applicable Psych involvement (Current and /or in the community):  No (Comment)  Discharge Needs  Concerns to be addressed:  Lack of Support Readmission within the last 30 days:  No Current discharge risk:  Lack of support system Barriers to Discharge:  No Barriers Identified   Anell Barr 06/08/2018, 2:34 PM

## 2018-06-08 NOTE — Clinical Social Work Placement (Signed)
   CLINICAL SOCIAL WORK PLACEMENT  NOTE  Date:  06/08/2018  Patient Details  [name] MRN: [id number] Date of Birth: 1941/03/30  Clinical Social Work is seeking post-discharge placement for this patient at the [place] level of care (*CSW will initial, date and re-position this form in  chart as items are completed):  Yes   Patient/family provided with [place] Work Department's list of facilities offering this level of care within the geographic area requested by the patient (or if unable, by the patient's family).  Yes   Patient/family informed of their freedom to choose among providers that offer the needed level of care, that participate in Medicare, Medicaid or managed care program needed by the patient, have an available bed and are willing to accept the patient.  Yes   Patient/family informed of [place]'s ownership interest in [facility] and [facility], as well as of the fact that they are under no obligation to receive care at these facilities.  PASRR submitted to EDS on 06/08/18     PASRR number received on       Existing PASRR number confirmed on 06/08/18     FL2 transmitted to all facilities in geographic area requested by pt/family on 06/08/18     FL2 transmitted to all facilities within larger geographic area on       Patient informed that his/her managed care company has contracts with or will negotiate with certain facilities, including the following:        Yes   Patient/family informed of bed offers received.  Patient chooses bed at [facility]     Physician recommends and patient chooses bed at      Patient to be transferred to [facility] on 06/08/18.  Patient to be transferred to facility by [facility]     Patient family notified on 06/08/18 of transfer.  Name of family member notified:  [name]     PHYSICIAN Please sign FL2      Additional Comment:    _______________________________________________ Ross Ludwig, LCSWA 06/08/2018, 2:38 PM

## 2018-06-08 NOTE — Telephone Encounter (Signed)
Form has been filled out and placed upfront in charge folder.

## 2018-06-08 NOTE — Clinical Social Work Note (Signed)
CSW spoke to patient and her husband, she would like to return to [facility] for short term rehab.  CSW contacted [facility], and they can accept patient today if she is medically ready for discharge and orders have been received.  CSW updated physician to determine if she is ready for discharge.  Jones Broom. [place], MSW, [place]  06/08/2018 12:04 PM

## 2018-06-08 NOTE — Telephone Encounter (Signed)
fyi

## 2018-06-08 NOTE — Discharge Summary (Addendum)
[facility] [name]    MR#:  [id number]  DATE OF BIRTH:  11/07/1940  DATE OF ADMISSION:  06/05/2018   ADMITTING PHYSICIAN: Lance Coon, MD  DATE OF DISCHARGE:  06/08/18  PRIMARY CARE PHYSICIAN: [name], MD   ADMISSION DIAGNOSIS:   Acute pulmonary edema (HCC) [J81.0] Pleural effusion on right [J90] HCAP (healthcare-associated pneumonia) [J18.9] Acute on chronic respiratory failure with hypoxia ([place]) [J96.21]  DISCHARGE DIAGNOSIS:   Principal Problem:   Acute respiratory failure with hypoxia (HCC) Active Problems:   Hyperlipidemia LDL goal <70   Essential hypertension   OSA on CPAP   Coronary artery disease   Type 2 DM with CKD stage 3 and hypertension (HCC)   CKD (chronic kidney disease) stage 3, GFR 30-59 ml/min (HCC)   Acute on chronic diastolic heart failure (HCC)   HCAP (healthcare-associated pneumonia)   Acute pulmonary edema (HCC)   SECONDARY DIAGNOSIS:   Past Medical History:  Diagnosis Date  . Acute respiratory failure ([place]) 11/21/2014  . Anxiety   . Arthritis 08/05/2013  . Atherosclerotic peripheral vascular disease with gangrene [facility]) august 2012  . Cardiomyopathy, ischemic    a. EF 35 to 40% by echo in 2013 b. EF improved to 50-55% by echo in 04/2015; 11/2016 Echo: EF 55-60%, Gr1 DD, mildly dil LA, nl RV fxn.  . Cerebral infarct ([place]) 08/17/2013  . Chronic diastolic CHF (congestive heart failure) (HCC)    a. EF 50-55% by echo in 04/2015; b. 11/2016 Echo: EF 55-60%, Gr1 DD.  [name] COPD (chronic obstructive pulmonary disease) ([place])   . Coronary artery disease, occlusive    a. Previous PCI to the LAD, LCx, and RCA in 2010, 2011, 2013, and 2016. All performed in [place]; b. 11/2016 Lexiscan MV: EF 43%, no ischemia->low risk.  . 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 Diabetic diarrhea ([place]) 10/03/2014  . DM type 2, uncontrolled, with renal complications ([place]) 09/16/175  . Hepatic steatosis    by CT  abd pelvis  . History of kidney stones    at a younger age  . Hyperlipidemia LDL goal <100 02/23/2014  . Hypertensive heart disease   . Osteoporosis, post-menopausal   . 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 Pill esophagitis   . Pleural effusion 10/25/2012   10/2012 CT chest >> small to moderate R lung effusion>> chylothorax, 100% lymphs 10/2013 thoracentesis> cytology negative, WBC 1471, > 90% "small lymphs" 01/2014 CT chest> near complete resolution of pleural effusion, stable lymphadenopathy 08/2014 CT chest [place] ([facility]): small right sided effusion decreased in size, stable mediastinal lymphadenopathy 1.0cm largest, 57m . 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 Sleep apnea   . Stage III chronic kidney disease ([facility]     HOSPITAL COURSE:   78year old female with past medical history significant for CAD status post PCI, diastolic heart failure, sarcoidosis, prior history of CVA, CKD stage III, diabetes, sleep apnea presents to hospital from home secondary to worsening shortness of breath.  1.  Acute on chronic diastolic CHF exacerbation-initially admitted to ICU for BiPAP. -Was started on  IV Lasix, not on home oxygen currently requiring 2L oxygen acutely-wean as tolerated. -Also had right-sided pleural effusion and 750 cc fluid tapped this admission. -Continue strict input and output monitoring, daily weights -Echocardiogram with diastolic dysfunction, EF of 60%. -Dietary restriction, fluid restriction and CHF clinic follow-up at discharge. -Also has underlying pulmonary sarcoidosis with moderate airflow restriction and restrictive disease on last  PFTs. -Continue daily prednisone. -Appreciate cardiology consult this admission. -Being discharged on low-dose Lasix daily.  2.  CKD stage III-stable at this time.  Continue to monitor while on low-dose Lasix.  3.  CAD-stable, no chest pain.  Prior stent to LAD, circumflex, RCA and multiple catheterizations. - Appreciate cardiology consult.  Continue aspirin, Brilinta, statin -On Imdur.  Also as needed nitro for occasional chest pain  4.  Essential tremors-restarted propranolol  5.  Hypertension-on propranolol, Imdur and Norvasc  Physical therapy recommended rehab.  will be discharged to [facility] today  DISCHARGE CONDITIONS:   Guarded  CONSULTS OBTAINED:   Treatment Team:  Wellington Hampshire, MD  DRUG ALLERGIES:   Allergies  Allergen Reactions  . Cephalexin Itching  . Contrast Media [Iodinated Diagnostic Agents] Itching  . Doxycycline Itching  . Gabapentin Itching  . Sulfa Antibiotics Itching   DISCHARGE MEDICATIONS:   Allergies as of 06/08/2018      Reactions   Cephalexin Itching   Contrast Media [iodinated Diagnostic Agents] Itching   Doxycycline Itching   Gabapentin Itching   Sulfa Antibiotics Itching      Medication List    TAKE these medications   acetaminophen 500 MG tablet Commonly known as:  TYLENOL Take 1,000 mg by mouth every 6 (six) hours as needed for mild pain or headache.   allopurinol 100 MG tablet Commonly known as:  ZYLOPRIM Take 1 tablet (100 mg total) by mouth daily.   ALPRAZolam 0.25 MG tablet Commonly known as:  XANAX Take 1 tablet (0.25 mg total) by mouth daily.   amLODipine 5 MG tablet Commonly known as:  NORVASC Take 2 tablets (10 mg total) by mouth daily.   aspirin EC 81 MG tablet Take 81 mg by mouth daily.   B-D UF III MINI PEN NEEDLES 31G X 5 MM Misc Generic drug:  Insulin Pen Needle USE THREE TIMES A DAY   blood glucose meter kit and supplies Dispense based on patient and insurance preference. Use up to  three times daily as directed. (FOR ICD-10 E11.21)   DERMACLOUD Crea Apply frequent to sacral ulcer   dicyclomine 10 MG capsule Commonly known as:  BENTYL Take 1 capsule (10 mg total) by mouth 4 (four) times daily -  before meals and at bedtime.   diphenoxylate-atropine 2.5-0.025 MG tablet Commonly known as:  LOMOTIL Take 1 tablet by mouth 4 (four) times daily as needed for diarrhea or loose stools.   donepezil 10 MG tablet Commonly known as:  ARICEPT Take 10 mg by mouth at bedtime.   feeding supplement (GLUCERNA SHAKE) Liqd Take 237 mLs by mouth 2 (two) times daily between meals.   fluticasone 50 MCG/ACT nasal spray Commonly known as:  FLONASE Place 2 sprays into both nostrils daily as needed for rhinitis.   furosemide 20 MG tablet Commonly known as:  LASIX Take 1 tablet (20 mg total) by mouth daily. Take 1 tablet (20 mg ) once daily on Monday, Wednesday and Friday. What changed:    how much to take  how to take this  when to take this   glucose blood  test strip Use to check blood sugars 2 times daily   glucose blood test strip Commonly known as:  ACCU-CHEK AVIVA PLUS USE TO CHECK GLUCOSE THREE TIMES DAILY   guaiFENesin 600 MG 12 hr tablet Commonly known as:  MUCINEX Take 600 mg by mouth 2 (two) times daily.   guaiFENesin-dextromethorphan 100-10 MG/5ML syrup Commonly known as:  ROBITUSSIN DM Take 5 mLs by mouth every 4 (four) hours as needed for cough.   hydrALAZINE 100 MG tablet Commonly known as:  APRESOLINE Take 1 tablet (100 mg total) by mouth every 8 (eight) hours.   insulin aspart protamine- aspart (70-30) 100 UNIT/ML injection Commonly known as:  NOVOLOG MIX 70/30 Inject 0.18 mLs (18 Units total) into the skin 2 (two) times daily with a meal. What changed:  how much to take   ipratropium-albuterol 0.5-2.5 (3) MG/3ML Soln Commonly known as:  DUONEB Take 3 mLs by nebulization every 6 (six) hours as needed (wheezing, shortness of breath).     isosorbide mononitrate 30 MG 24 hr tablet Commonly known as:  IMDUR Take 1 tablet (30 mg total) by mouth daily.   loperamide 2 MG capsule Commonly known as:  IMODIUM Take 1 capsule (2 mg total) by mouth 4 (four) times daily as needed for diarrhea or loose stools.   mometasone-formoterol 200-5 MCG/ACT Aero Commonly known as:  DULERA Inhale 2 puffs into the lungs 2 (two) times daily.   nitroGLYCERIN 0.4 MG/SPRAY spray Commonly known as:  NITROLINGUAL Place 1 spray under the tongue every 5 (five) minutes x 3 doses as needed for chest pain.   ondansetron 4 MG tablet Commonly known as:  ZOFRAN Take 4 mg by mouth every 8 (eight) hours as needed for nausea or vomiting.   Potassium Chloride ER 20 MEQ Tbcr Take 20 mEq by mouth daily. While on lasix Start taking on:  June 09, 2018   predniSONE 10 MG tablet Commonly known as:  DELTASONE Take 10 mg by mouth daily with breakfast.   pregabalin 100 MG capsule Commonly known as:  LYRICA Take 100 mg by mouth 2 (two) times daily.   propranolol 10 MG tablet Commonly known as:  INDERAL Take 1 tablet (10 mg total) by mouth 3 (three) times daily. What changed:  when to take this   rosuvastatin 20 MG tablet Commonly known as:  CRESTOR Take 1 tablet (20 mg total) by mouth daily.   senna 8.6 MG tablet Commonly known as:  SENOKOT Take 1 tablet by mouth 2 (two) times daily as needed for constipation.   ticagrelor 60 MG Tabs tablet Commonly known as:  BRILINTA Take 1 tablet (60 mg total) by mouth 2 (two) times daily.        DISCHARGE INSTRUCTIONS:   1. PCP f/u in 1-2 weeks 2. Cardiology f/u in 2 weeks  DIET:   Cardiac diet  ACTIVITY:   Activity as tolerated  OXYGEN:   Home Oxygen: Yes.    Oxygen Delivery: 2 liters/min via  Nasal cannula  DISCHARGE LOCATION:   nursing home   If you experience worsening of your admission symptoms, develop shortness of breath, life threatening emergency, suicidal or homicidal thoughts  you must seek medical attention immediately by calling 911 or calling your MD immediately  if symptoms less severe.  You Must read complete instructions/literature along with all the possible adverse reactions/side effects for all the Medicines you take and that have been prescribed to you. Take any new Medicines after you have completely understood and accpet all  the possible adverse reactions/side effects.   Please note  You were cared for by a hospitalist during your hospital stay. If you have any questions about your discharge medications or the care you received while you were in the hospital after you are discharged, you can call the unit and asked to speak with the hospitalist on call if the hospitalist that took care of you is not available. Once you are discharged, your primary care physician will handle any further medical issues. Please note that NO REFILLS for any discharge medications will be authorized once you are discharged, as it is imperative that you return to your primary care physician (or establish a relationship with a primary care physician if you do not have one) for your aftercare needs so that they can reassess your need for medications and monitor your lab values.    On the day of Discharge:  VITAL SIGNS:   Blood pressure (!) 130/48, pulse 70, temperature 98.7 F (37.1 C), temperature source Oral, resp. rate 19, height 5' 7"  (1.702 m), weight 80.2 kg, SpO2 90 %.  PHYSICAL EXAMINATION:    GENERAL:  78 y.o.-year-old elderly patient lying in the bed with no acute distress.  EYES: Pupils equal, round, reactive to light and accommodation. No scleral icterus. Extraocular muscles intact.  HEENT: Head atraumatic, normocephalic. Has central tremors. Oropharynx and nasopharynx clear.  NECK:  Supple, no jugular venous distention. No thyroid enlargement, no tenderness.  LUNGS: Normal breath sounds bilaterally, no wheezing, rales,rhonchi or crepitation. No use of accessory  muscles of respiration.  Decreased bibasilar breath sounds CARDIOVASCULAR: S1, S2 normal. No  rubs, or gallops.  2/6 systolic murmur is present ABDOMEN: Soft, nontender, nondistended. Bowel sounds present. No organomegaly or mass.  EXTREMITIES: No  cyanosis, or clubbing.  1+ pedal edema noted NEUROLOGIC: Cranial nerves II through XII are intact. Muscle strength 5/5 in all extremities. Sensation intact. Gait not checked.  PSYCHIATRIC: The patient is alert and oriented x 3.  SKIN: No obvious rash, lesion, or ulcer.   DATA REVIEW:   CBC Recent Labs  Lab 06/07/18 0422  WBC 7.3  HGB 9.7*  HCT 31.1*  PLT 408*    Chemistries  Recent Labs  Lab 06/06/18 0431  06/08/18 0434  NA 140   < > 140  K 3.8   < > 3.2*  CL 107   < > 103  CO2 27   < > 29  GLUCOSE 136*   < > 131*  BUN 13   < > 13  CREATININE 1.50*   < > 1.46*  CALCIUM 8.3*   < > 8.5*  MG 2.0  --   --    < > = values in this interval not displayed.     Microbiology Results  Results for orders placed or performed during the hospital encounter of 06/05/18  MRSA PCR Screening     Status: None   Collection Time: 06/05/18 11:39 PM  Result Value Ref Range Status   MRSA by PCR NEGATIVE NEGATIVE Final    Comment:        The GeneXpert MRSA Assay (FDA approved for NASAL specimens only), is one component of a comprehensive MRSA colonization surveillance program. It is not intended to diagnose MRSA infection nor to guide or monitor treatment for MRSA infections. Performed at [facility], [address] [place], [place] [address]    *Note: Due to a large number of results and/or encounters for the requested time period, some results have  not been displayed. A complete set of results can be found in Results Review.    RADIOLOGY:  No results found.   Management plans discussed with the patient, family and they are in agreement.  CODE STATUS:     Code Status Orders  (From admission, onward)          Start     Ordered   06/05/18 2309  Full code  Continuous     06/05/18 2309        Code Status History    Date Active Date Inactive Code Status Order ID Comments User Context   04/25/2018 0647 04/30/2018 2057 Full Code [id number]  Harrie Foreman, MD Inpatient   06/04/2017 0151 06/04/2017 1944 Full Code [id number]  Lance Coon, MD Inpatient   05/24/2017 0129 05/26/2017 1926 Full Code [id number]  Lance Coon, MD Inpatient   11/14/2016 2249 11/15/2016 [place] Full Code [id number]  [place], [place], DO ED   10/10/2016 1325 10/13/2016 2020 Full Code [id number]  Earnie Larsson, MD Inpatient   06/11/2016 2142 06/12/2016 1923 Full Code [id number]  [place], [place], DO ED   01/02/2016 0524 01/02/2016 0914 Full Code [id number]  Saundra Shelling, MD Inpatient   06/26/2015 0736 06/29/2015 1954 Full Code [id number]  Demetrios Loll, MD ED   04/20/2015 0118 04/22/2015 1918 Full Code [id number]  Lance Coon, MD Inpatient      TOTAL TIME TAKING CARE OF THIS PATIENT: 38 minutes.    Gladstone Lighter M.D on 06/08/2018 at 12:08 PM  Between 7am to 6pm - Pager - [phone]  After 6pm go to [email or web address] - Proofreader  [employer] [facility]  Office  [phone]  CC: Primary care physician; [name], MD   Note: This dictation was prepared with Dragon dictation along with smaller phrase technology. Any transcriptional errors that result from this process are unintentional.

## 2018-06-09 ENCOUNTER — Other Ambulatory Visit: Payer: Self-pay

## 2018-06-09 NOTE — Patient Outreach (Signed)
Care Coordination: Admitted for heart failure on 06/05/2018. Discharged to [facility] for short term rehab on 06/08/2018.    Message sent to care management assistant to assign to social worker to follow for discharge and notify assigned case manager.  [name], RN, BSN, CEN [facility] [employer] [phone]

## 2018-06-09 NOTE — Addendum Note (Signed)
Addended by: Thana Ates on: 06/09/2018 04:12 PM   Modules accepted: Orders

## 2018-06-10 ENCOUNTER — Telehealth: Payer: Self-pay | Admitting: Internal Medicine

## 2018-06-10 NOTE — Telephone Encounter (Signed)
Patient discharged to [facility]. SNF.

## 2018-06-12 NOTE — Progress Notes (Signed)
Patient ID: Pamela Collier, female    DOB: [birth year], 78 y.o.   MRN: [id number]  HPI  Pamela Collier is a 78 y/o female with a history of CAD, DM, hyperlipidemia, CKD, pulmonary sarcoidosis, depression, anxiety, COPD, obstructive sleep apnea and chronic heart failure.   Echo report from 06/06/2018 reviewed and showed an EF of 60-65%. Echo report from 04/27/18 reviewed and showed an EF of 60-65%.  Admitted 06/05/2018 due to acute on chronic HF. Cardiology consult obtained. Initially needed bipap and then was weaned. Given IV lasix and transitioned to oral diuretics. Had right-sided pleural effusion with 750 cc drained. Discharged to [facility] after 3 days. Admitted 04/25/18 due to COPD/ HF exacerbation. Cardiology consult obtained. Initially given IV steroids and then transitioned to oral prednisone. HTN medications adjusted. Discharged after 5 days.   Pamela Collier presents today for a follow-up visit with a chief complaint of minimal shortness of breath upon moderate exertion. Pamela Collier describes this as chronic in nature having been present for several years. Pamela Collier has associated fatigue, cough, intermittent chest pain, pedal edema and light-headedness along with this. Pamela Collier denies any difficulty sleeping, abdominal distention, palpitations or weight gain. Pamela Collier continues to be at [facility] but will be moving to [place] to her son/ daughter-in-law's home this spring. Has previously seen a PCP and cardiologist in [place] so will be re-establishing care. Pamela Collier doesn't think Pamela Collier's being weighed daily and is not wearing support socks.   Past Medical History:  Diagnosis Date  . Acute respiratory failure ([place]) 11/21/2014  . Anxiety   . Arthritis 08/05/2013  . Atherosclerotic peripheral vascular disease with gangrene [facility]) august 2012  . Cardiomyopathy, ischemic    a. EF 35 to 40% by echo in 2013 b. EF improved to 50-55% by echo in 04/2015; 11/2016 Echo: EF 55-60%, Gr1 DD, mildly dil LA, nl RV fxn.  . Cerebral infarct ([place])  08/17/2013  . Chronic diastolic CHF (congestive heart failure) (HCC)    a. EF 50-55% by echo in 04/2015; b. 11/2016 Echo: EF 55-60%, Gr1 DD.  [name] COPD (chronic obstructive pulmonary disease) ([place])   . Coronary artery disease, occlusive    a. Previous PCI to the LAD, LCx, and RCA in 2010, 2011, 2013, and 2016. All performed in [place]; b. 11/2016 Lexiscan MV: EF 43%, no ischemia->low risk.  . 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 Diabetic diarrhea ([place]) 10/03/2014  . DM type 2, uncontrolled, with renal complications ([place]) 11/14/1605  . Hepatic steatosis    by CT abd pelvis  . History of kidney stones    at a younger age  . Hyperlipidemia LDL goal <100 02/23/2014  . Hypertensive heart disease   . Osteoporosis, post-menopausal   . 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 Pill esophagitis   . Pleural effusion 10/25/2012   10/2012 CT chest >> small to moderate R lung effusion>> chylothorax, 100% lymphs 10/2013 thoracentesis> cytology negative, WBC 1471, > 90% "small lymphs" 01/2014 CT chest> near complete resolution of pleural effusion, stable lymphadenopathy 08/2014 CT chest [place] ([facility]): small right sided effusion decreased in size, stable mediastinal lymphadenopathy 1.0cm largest, 78m . Pulmonary sarcoidosis ([place] 12/07/2012   [place] 03[place]2013 Full PFT [facility] > UNACCEPTABLE AND NOT REPRODUCIBLE DATA> Ratio 71% FEV 1 1.02 L (55% pred), FVC 1.31 L (49% pred) could  not do lung volumes or DLCO   . Sleep apnea   . Stage III chronic kidney disease [facility])    Past Surgical History:  Procedure Laterality Date  . ABDOMINAL HYSTERECTOMY     at ge 39. secondary to bleeding/partial  . ANTERIOR CERVICAL DECOMP/DISCECTOMY FUSION N/A 10/10/2016   Procedure: Anterior Cervical Discectomy Fusion - Cervical four4- five - Cervical five-Cervical six -  Cervical six-Cervical seven;  Surgeon: Earnie Larsson, MD;  Location: [facility];  Service: Neurosurgery;  Laterality: N/A;  . BREAST CYST ASPIRATION Right   . CARPAL TUNNEL RELEASE Bilateral   . CHOLECYSTECTOMY     in [place]   . COLONOSCOPY WITH PROPOFOL N/A 01/09/2015   Procedure: COLONOSCOPY WITH PROPOFOL;  Surgeon: Lucilla Lame, MD;  Location: [facility] ENDOSCOPY;  Service: Endoscopy;  Laterality: N/A;  . CORONARY ANGIOPLASTY WITH STENT PLACEMENT     [place]; [facility]  . EYE SURGERY     bil cataracts  . HERNIA REPAIR     umbilical/ Dr Pat Patrick  . OOPHORECTOMY    . PTCA  August 2012   Right Posterior tibial artery , [place]  . TOE AMPUTATION  Sept 2012   Right 2nd toe, [place]   Family History  Problem Relation Age of Onset  . Heart disease Father   . Heart attack Father   . Heart disease Sister   . Heart attack Sister   . Heart disease Brother   . Heart attack Brother   . Tremor Mother   . Asthma Grandchild   . Breast cancer Maternal Aunt    Social History   Tobacco Use  . Smoking status: Never Smoker  . Smokeless tobacco: Never Used  Substance Use Topics  . Alcohol use: No    Alcohol/week: 0.0 standard drinks   Allergies  Allergen Reactions  . Cephalexin Itching  . Contrast Media [Iodinated Diagnostic Agents] Itching  . Doxycycline Itching  . Gabapentin Itching  . Sulfa Antibiotics Itching   Prior to Admission medications   Medication Sig Start Date End Date Taking? Authorizing Provider  acetaminophen (TYLENOL) 500 MG tablet Take 1,000 mg by mouth every 6 (six) hours as needed for mild pain or headache.    Yes [provider]  allopurinol (ZYLOPRIM) 100 MG tablet Take 1 tablet (100 mg total) by mouth daily. 03/22/18 03/22/19 Yes Wieting, Richard, MD  ALPRAZolam [name]) 0.25 MG tablet Take 1 tablet (0.25 mg total) by mouth daily. 06/08/18  Yes Gladstone Lighter, MD  amLODipine (NORVASC) 5 MG tablet Take 2 tablets (10 mg total) by mouth daily.  04/30/18  Yes Henreitta Leber, MD  aspirin EC 81 MG tablet Take 81 mg by mouth daily.   Yes [provider]  B-D UF III MINI PEN NEEDLES 31G X 5 MM MISC USE THREE TIMES A DAY 02/18/18  Yes [name], MD  blood glucose meter kit and supplies Dispense based on patient and insurance preference. Use up to three times daily as directed. (FOR ICD-10 E11.21) 06/03/18  Yes [name], MD  dicyclomine (BENTYL) 10 MG capsule Take 1 capsule (10 mg total) by mouth 4 (four) times daily -  before meals and at bedtime. 03/10/18  Yes [name], MD  diphenoxylate-atropine (LOMOTIL) 2.5-0.025 MG tablet Take 1 tablet by mouth 4 (four) times daily as needed for diarrhea or loose stools. 06/08/18  Yes Gladstone Lighter, MD  fluticasone (FLONASE) 50 MCG/ACT nasal spray Place 2 sprays into both nostrils daily  as needed for rhinitis. 01/20/18  Yes [name], MD  furosemide (LASIX) 20 MG tablet Take 1 tablet (20 mg total) by mouth daily. Take 1 tablet (20 mg ) once daily on Monday, Wednesday and Friday. 06/08/18  Yes Gladstone Lighter, MD  glucose blood (ACCU-CHEK AVIVA PLUS) test strip USE TO CHECK GLUCOSE THREE TIMES DAILY 02/16/18  Yes [name], MD  glucose blood test strip Use to check blood sugars 2 times daily 02/16/18  Yes [name], MD  guaiFENesin (MUCINEX) 600 MG 12 hr tablet Take 600 mg by mouth 2 (two) times daily.   Yes [provider]  guaiFENesin-dextromethorphan (ROBITUSSIN DM) 100-10 MG/5ML syrup Take 5 mLs by mouth every 4 (four) hours as needed for cough. 04/30/18  Yes Sainani, Belia Heman, MD  insulin aspart protamine- aspart (NOVOLOG MIX 70/30) (70-30) 100 UNIT/ML injection Inject 0.18 mLs (18 Units total) into the skin 2 (two) times daily with a meal. 06/08/18  Yes Gladstone Lighter, MD  ipratropium-albuterol (DUONEB) 0.5-2.5 (3) MG/3ML SOLN Take 3 mLs by nebulization every 6 (six) hours as needed (wheezing, shortness of breath). 06/08/18  Yes Gladstone Lighter, MD  isosorbide mononitrate (IMDUR) 30 MG 24 hr tablet Take 1 tablet (30 mg total) by mouth daily. 03/03/18  Yes Gollan, Kathlene November, MD  mometasone-formoterol (DULERA) 200-5 MCG/ACT AERO Inhale 2 puffs into the lungs 2 (two) times daily. 02/01/18  Yes [name], MD  nitroGLYCERIN (NITROLINGUAL) 0.4 MG/SPRAY spray Place 1 spray under the tongue every 5 (five) minutes x 3 doses as needed for chest pain. 01/14/17  Yes [name], MD  ondansetron (ZOFRAN) 4 MG tablet Take 4 mg by mouth every 8 (eight) hours as needed for nausea or vomiting.   Yes [provider]  predniSONE (DELTASONE) 10 MG tablet Take 10 mg by mouth daily with breakfast.   Yes [provider]  pregabalin (LYRICA) 100 MG capsule Take 100 mg by mouth 2 (two) times daily.    Yes [provider]  propranolol (INDERAL) 10 MG tablet Take 1 tablet (10 mg total) by mouth 3 (three) times daily. 06/08/18  Yes Gladstone Lighter, MD  rosuvastatin (CRESTOR) 20 MG tablet Take 1 tablet (20 mg total) by mouth daily. 04/16/18  Yes [name], MD  senna (SENOKOT) 8.6 MG tablet Take 1 tablet by mouth 2 (two) times daily as needed for constipation.   Yes [provider]  ticagrelor (BRILINTA) 60 MG TABS tablet Take 1 tablet (60 mg total) by mouth 2 (two) times daily. 04/22/18  Yes Minna Merritts, MD    Review of Systems  Constitutional: Positive for fatigue. Negative for appetite change.  HENT: Positive for rhinorrhea. Negative for congestion and sore throat.   Eyes: Negative.   Respiratory: Positive for cough and shortness of breath. Negative for chest tightness.   Cardiovascular: Positive for chest pain (at times) and leg swelling (ankles). Negative for palpitations.  Gastrointestinal: Negative for abdominal distention and abdominal pain.  Endocrine: Negative.   Genitourinary: Negative.   Musculoskeletal: Negative for back pain and neck pain.  Skin: Negative.   Allergic/Immunologic:  Negative.   Neurological: Positive for tremors (in neck) and light-headedness. Negative for dizziness.  Hematological: Negative for adenopathy. Does not bruise/bleed easily.  Psychiatric/Behavioral: Negative for dysphoric mood and sleep disturbance (wearing CPAP at night and sleeping on 1 pillow). The patient is not nervous/anxious.    Vitals:   06/14/18 1239  BP: (!) 139/49  Pulse: 64  Resp: 18  SpO2: 95%  Weight: 182 lb 4 oz (82.7 kg)  Height: _0  (1.702 m)   Wt Readings from Last 3 Encounters:  06/14/18 182 lb 4 oz (82.7 kg)  06/08/18 176 lb 14.4 oz (80.2 kg)  06/01/18 188 lb 9.6 oz (85.5 kg)   Lab Results  Component Value Date   CREATININE 1.46 (H) 06/08/2018   CREATININE 1.50 (H) 06/07/2018   CREATININE 1.50 (H) 06/06/2018    Physical Exam Vitals signs and nursing note reviewed.  Constitutional:      Appearance: Normal appearance.  HENT:     Head: Normocephalic and atraumatic.  Neck:     Musculoskeletal: Normal range of motion and neck supple.  Cardiovascular:     Rate and Rhythm: Normal rate and regular rhythm.  Pulmonary:     Effort: Pulmonary effort is normal.     Breath sounds: No wheezing or rales.  Abdominal:     Palpations: Abdomen is soft.     Tenderness: There is no abdominal tenderness.  Musculoskeletal:        General: No tenderness.     Right lower leg: Edema (1+ pitting) present.     Left lower leg: Edema (1+ pitting) present.  Skin:    General: Skin is warm and dry.  Neurological:     General: No focal deficit present.     Mental Status: Pamela Collier is alert and oriented to person, place, and time.     Comments: Tremor of the neck noted  Psychiatric:        Mood and Affect: Mood normal.        Behavior: Behavior normal.     Assessment & Plan:  1: Chronic heart failure with preserved ejection fraction- - NYHA class II - euvolemic today - order written for her to be weighed daily and to call for an overnight weight gain of >2 pounds or a weekly  weight gain of >5 pounds - weight unchanged from last visit here 1 month ago - saw cardiology ([place]) 05/17/2018 - taking furosemide M, W & F - BNP 04/25/18 was 282.0 - patient reports receiving her flu vaccine for this season  2: HTN- - BP looks good today - saw PCP Derrel Nip) 06/01/2018 - BMP from 06/08/2018 reviewed and showed sodium 140, potassium 3.2, creatinine 1.46 and GFR 40  3: DM- - A1c 02/10/18 was 7.7%  4: Lymphedema- - stage 2 - not wearing compression socks and an order was written for the facility to put the socks on every morning with removal at bedtime - encouraged her to elevate her legs when sitting for long periods of time - consider lymphapress compression boots if edema persists  Facility medication list was reviewed.  Since patient [place] out [place] in the next couple of months, will not make a return appointment at this time. Advised patient to call us if Pamela Collier needs [place] prior to her move and we can get her scheduled for another appointment.

## 2018-06-14 ENCOUNTER — Encounter: Payer: Self-pay | Admitting: Family

## 2018-06-14 ENCOUNTER — Ambulatory Visit: Payer: No Typology Code available for payment source | Attending: Family | Admitting: Family

## 2018-06-14 VITALS — BP 139/49 | HR 64 | Resp 18 | Ht 67.0 in | Wt 182.2 lb

## 2018-06-14 DIAGNOSIS — Z7952 Long term (current) use of systemic steroids: Secondary | ICD-10-CM | POA: Insufficient documentation

## 2018-06-14 DIAGNOSIS — Z794 Long term (current) use of insulin: Secondary | ICD-10-CM | POA: Insufficient documentation

## 2018-06-14 DIAGNOSIS — I2510 Atherosclerotic heart disease of native coronary artery without angina pectoris: Secondary | ICD-10-CM | POA: Insufficient documentation

## 2018-06-14 DIAGNOSIS — Z79899 Other long term (current) drug therapy: Secondary | ICD-10-CM | POA: Diagnosis not present

## 2018-06-14 DIAGNOSIS — Z955 Presence of coronary angioplasty implant and graft: Secondary | ICD-10-CM | POA: Insufficient documentation

## 2018-06-14 DIAGNOSIS — Z91041 Radiographic dye allergy status: Secondary | ICD-10-CM | POA: Insufficient documentation

## 2018-06-14 DIAGNOSIS — Z7951 Long term (current) use of inhaled steroids: Secondary | ICD-10-CM | POA: Insufficient documentation

## 2018-06-14 DIAGNOSIS — I10 Essential (primary) hypertension: Secondary | ICD-10-CM

## 2018-06-14 DIAGNOSIS — Z7902 Long term (current) use of antithrombotics/antiplatelets: Secondary | ICD-10-CM | POA: Diagnosis not present

## 2018-06-14 DIAGNOSIS — N183 Chronic kidney disease, stage 3 (moderate): Secondary | ICD-10-CM | POA: Insufficient documentation

## 2018-06-14 DIAGNOSIS — Z89421 Acquired absence of other right toe(s): Secondary | ICD-10-CM | POA: Insufficient documentation

## 2018-06-14 DIAGNOSIS — Z882 Allergy status to sulfonamides status: Secondary | ICD-10-CM | POA: Insufficient documentation

## 2018-06-14 DIAGNOSIS — Z8249 Family history of ischemic heart disease and other diseases of the circulatory system: Secondary | ICD-10-CM | POA: Insufficient documentation

## 2018-06-14 DIAGNOSIS — I5032 Chronic diastolic (congestive) heart failure: Secondary | ICD-10-CM

## 2018-06-14 DIAGNOSIS — M1990 Unspecified osteoarthritis, unspecified site: Secondary | ICD-10-CM | POA: Insufficient documentation

## 2018-06-14 DIAGNOSIS — E1122 Type 2 diabetes mellitus with diabetic chronic kidney disease: Secondary | ICD-10-CM | POA: Insufficient documentation

## 2018-06-14 DIAGNOSIS — I890 Lymphedema, not elsewhere classified: Secondary | ICD-10-CM | POA: Insufficient documentation

## 2018-06-14 DIAGNOSIS — I130 Hypertensive heart and chronic kidney disease with heart failure and stage 1 through stage 4 chronic kidney disease, or unspecified chronic kidney disease: Secondary | ICD-10-CM | POA: Insufficient documentation

## 2018-06-14 DIAGNOSIS — Z881 Allergy status to other antibiotic agents status: Secondary | ICD-10-CM | POA: Insufficient documentation

## 2018-06-14 DIAGNOSIS — Z8673 Personal history of transient ischemic attack (TIA), and cerebral infarction without residual deficits: Secondary | ICD-10-CM | POA: Insufficient documentation

## 2018-06-14 DIAGNOSIS — F419 Anxiety disorder, unspecified: Secondary | ICD-10-CM | POA: Insufficient documentation

## 2018-06-14 DIAGNOSIS — Z7982 Long term (current) use of aspirin: Secondary | ICD-10-CM | POA: Insufficient documentation

## 2018-06-14 DIAGNOSIS — Z87442 Personal history of urinary calculi: Secondary | ICD-10-CM | POA: Insufficient documentation

## 2018-06-14 DIAGNOSIS — E1151 Type 2 diabetes mellitus with diabetic peripheral angiopathy without gangrene: Secondary | ICD-10-CM | POA: Insufficient documentation

## 2018-06-14 DIAGNOSIS — E1121 Type 2 diabetes mellitus with diabetic nephropathy: Secondary | ICD-10-CM

## 2018-06-14 DIAGNOSIS — G4733 Obstructive sleep apnea (adult) (pediatric): Secondary | ICD-10-CM | POA: Insufficient documentation

## 2018-06-14 DIAGNOSIS — E785 Hyperlipidemia, unspecified: Secondary | ICD-10-CM | POA: Insufficient documentation

## 2018-06-14 DIAGNOSIS — Z888 Allergy status to other drugs, medicaments and biological substances status: Secondary | ICD-10-CM | POA: Insufficient documentation

## 2018-06-14 DIAGNOSIS — D860 Sarcoidosis of lung: Secondary | ICD-10-CM | POA: Insufficient documentation

## 2018-06-14 DIAGNOSIS — J449 Chronic obstructive pulmonary disease, unspecified: Secondary | ICD-10-CM | POA: Insufficient documentation

## 2018-06-14 DIAGNOSIS — Z981 Arthrodesis status: Secondary | ICD-10-CM | POA: Insufficient documentation

## 2018-06-14 NOTE — Patient Instructions (Signed)
Continue weighing daily and call for an overnight weight gain of > 2 pounds or a weekly weight gain of >5 pounds. 

## 2018-06-15 ENCOUNTER — Ambulatory Visit: Payer: Self-pay | Admitting: *Deleted

## 2018-06-15 DIAGNOSIS — D860 Sarcoidosis of lung: Secondary | ICD-10-CM | POA: Diagnosis not present

## 2018-06-15 DIAGNOSIS — E1351 Other specified diabetes mellitus with diabetic peripheral angiopathy without gangrene: Secondary | ICD-10-CM | POA: Diagnosis not present

## 2018-06-15 DIAGNOSIS — I10 Essential (primary) hypertension: Secondary | ICD-10-CM | POA: Diagnosis not present

## 2018-06-15 DIAGNOSIS — I2510 Atherosclerotic heart disease of native coronary artery without angina pectoris: Secondary | ICD-10-CM | POA: Diagnosis not present

## 2018-06-15 DIAGNOSIS — I255 Ischemic cardiomyopathy: Secondary | ICD-10-CM | POA: Diagnosis not present

## 2018-06-15 DIAGNOSIS — J984 Other disorders of lung: Secondary | ICD-10-CM | POA: Diagnosis not present

## 2018-06-15 DIAGNOSIS — I5033 Acute on chronic diastolic (congestive) heart failure: Secondary | ICD-10-CM | POA: Diagnosis not present

## 2018-06-15 DIAGNOSIS — R5381 Other malaise: Secondary | ICD-10-CM | POA: Diagnosis not present

## 2018-06-15 DIAGNOSIS — E1149 Type 2 diabetes mellitus with other diabetic neurological complication: Secondary | ICD-10-CM | POA: Diagnosis not present

## 2018-06-16 ENCOUNTER — Encounter: Payer: Self-pay | Admitting: *Deleted

## 2018-06-16 NOTE — Telephone Encounter (Signed)
This encounter was created in error - please disregard.

## 2018-06-22 ENCOUNTER — Ambulatory Visit: Payer: Medicare Other | Admitting: Internal Medicine

## 2018-06-22 DIAGNOSIS — E1140 Type 2 diabetes mellitus with diabetic neuropathy, unspecified: Secondary | ICD-10-CM | POA: Diagnosis not present

## 2018-06-22 DIAGNOSIS — J90 Pleural effusion, not elsewhere classified: Secondary | ICD-10-CM | POA: Diagnosis not present

## 2018-06-22 DIAGNOSIS — Z794 Long term (current) use of insulin: Secondary | ICD-10-CM | POA: Diagnosis not present

## 2018-06-22 DIAGNOSIS — I5031 Acute diastolic (congestive) heart failure: Secondary | ICD-10-CM | POA: Diagnosis not present

## 2018-06-24 ENCOUNTER — Telehealth: Payer: Self-pay

## 2018-06-24 DIAGNOSIS — I130 Hypertensive heart and chronic kidney disease with heart failure and stage 1 through stage 4 chronic kidney disease, or unspecified chronic kidney disease: Secondary | ICD-10-CM | POA: Diagnosis not present

## 2018-06-24 DIAGNOSIS — E1122 Type 2 diabetes mellitus with diabetic chronic kidney disease: Secondary | ICD-10-CM | POA: Diagnosis not present

## 2018-06-24 DIAGNOSIS — J449 Chronic obstructive pulmonary disease, unspecified: Secondary | ICD-10-CM | POA: Diagnosis not present

## 2018-06-24 DIAGNOSIS — I5032 Chronic diastolic (congestive) heart failure: Secondary | ICD-10-CM | POA: Diagnosis not present

## 2018-06-24 DIAGNOSIS — N184 Chronic kidney disease, stage 4 (severe): Secondary | ICD-10-CM | POA: Diagnosis not present

## 2018-06-24 DIAGNOSIS — D631 Anemia in chronic kidney disease: Secondary | ICD-10-CM | POA: Diagnosis not present

## 2018-06-24 NOTE — Telephone Encounter (Signed)
Copied from [place] [id number]. Topic: Quick Communication - See Telephone Encounter >> Jun 24, 2018 12:32 PM Berneta Levins wrote: CRM for notification. See Telephone encounter for: 06/24/18.  Pt will be seen by nursing today but has asked to hold off on OT and PT until next Wednesday. Stephanie with [facility] can be reached at [phone]

## 2018-06-24 NOTE — Progress Notes (Deleted)
Cardiology Office Note Date:  06/24/2018  Patient ID:  Pamela Collier, Pamela Collier 1941-04-12, MRN [id number] PCP:  [name], MD  Cardiologist:  Dr. Rockey Situ, MD  ***refresh   Chief Complaint: Hospital follow-up  History of Present Illness: Pamela Collier is a 78 y.o. 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  Patient is status post multiple PCI's to the LAD, LCx, and RCA with catheterizations performed in 2010, 2011, 2013, and 2016 in [place].  We do not have details of her most recent cardiac catheterization from 2016.  Most recent cath in which we have details is from 05/2011 which showed 40% mid LAD stenosis, 50% distal LAD stenosis, small diagonal with 70% stenosis, 50% proximal LCx stenosis, 75% distal LCx stenosis with patent stent, small RCA with calcified, severe 90% distal RCA stenosis.  EF was 35 to 40%.  Most recent ischemic evaluation via stress test in 11/2016 which showed no significant ischemia with an EF of 43%.  Echocardiogram at that time showed an EF of 55 to 60%, unable to exclude regional wall motion abnormalities, grade 1 diastolic dysfunction, mildly dilated left atrium, RV systolic function normal, PASP normal.  Echo from 05/24/2017 showed an EF of 60 to 65%, no regional wall motion abnormalities, grade 2 diastolic dysfunction, calcified mitral annulus, left atrium normal in size,  RVSF normal, PASP normal.  Patient was seen in the ER twice in 03/2018 for chest pain and near syncope with cardiac enzymes being negative.  Her near syncope/dizziness was felt to be aggravated by UTI and chronic diarrhea.  She was admitted to the hospital in 04/2018 with COPD exacerbation, acute on chronic diastolic CHF, and atypical chest pain.  Troponin peaked at 0.03 and was flat trending.  BNP was minimally elevated at 282.  Chest x-ray showed cardiomegaly with interstitial prominence suggestive of possible pulmonary edema with a small right pleural effusion.  Echo showed an EF of 60 to 65%, moderate LVH, no regional wall motion abnormalities, grade 2 diastolic dysfunction, calcified mitral annulus, mildly dilated left atrium, RV cavity size was normal with normal RV systolic function, trivial pericardial effusion was identified posterior to the heart.  She was treated for COPD exacerbation and with diuresis she required Lasix being held for several days secondary to AKI.  She was discharged with a documented weight of 80.8 kg.  In hospital follow-up on 05/17/2018 she was feeling very well.  She had a documented weight of 82.1 kg.  She was readmitted to the hospital from 1/25 through 1/28 for acute hypoxic respiratory distress with bilateral pleural effusions possibly in the setting of flash pulmonary edema with severely elevated blood pressures and a contribution of acute on chronic diastolic CHF.  Echo on 06/06/2018 showed an EF of 60 to 65%, moderate concentric LVH, no regional wall motion abnormalities, increased ventricular diastolic pressure, calcified mitral annulus, trivial pericardial effusion was identified.  Unable to estimate right-sided pressures.  Troponin was mildly elevated and flat trending with a peak of  0.08.  Influenza was negative.  Chest CT showed a moderate right and small left pleural effusion.  She underwent successful right-sided thoracentesis on 06/07/2018 with approximately 750 mL of  pleural fluid aspirated.  Discharge labs: Potassium 3.2, serum creatinine 1.46, WBC 7.3, Hgb 9.7  Discharge medications: Medication List    TAKE these medications   acetaminophen 500 MG tablet Commonly known as:  TYLENOL Take 1,000 mg by mouth every 6 (six) hours as needed for mild pain or headache.   allopurinol 100 MG tablet Commonly known as:  ZYLOPRIM Take 1 tablet (100 mg total) by mouth daily.   ALPRAZolam 0.25 MG tablet Commonly known as:  XANAX Take 1 tablet (0.25 mg total) by mouth daily.   amLODipine 5 MG tablet Commonly known as:  NORVASC Take 2 tablets (10 mg total) by mouth daily.   aspirin EC 81 MG tablet Take 81 mg by mouth daily.   B-D UF III MINI PEN NEEDLES 31G X 5 MM Misc Generic drug:  Insulin Pen Needle USE THREE TIMES A DAY   blood glucose meter kit and supplies Dispense based on patient and insurance preference. Use up to three times daily as directed. (FOR ICD-10 E11.21)   DERMACLOUD Crea Apply frequent to sacral ulcer   dicyclomine 10 MG capsule Commonly known as:  BENTYL Take 1 capsule (10 mg total) by mouth 4 (four) times daily -  before meals and at bedtime.   diphenoxylate-atropine 2.5-0.025 MG tablet Commonly known as:  LOMOTIL Take 1 tablet by mouth 4 (four) times daily as needed for diarrhea or loose stools.   donepezil 10 MG tablet Commonly known as:  ARICEPT Take 10 mg by mouth at bedtime.   feeding supplement (GLUCERNA SHAKE) Liqd Take 237 mLs by mouth 2 (two) times daily between meals.   fluticasone 50 MCG/ACT nasal spray Commonly known as:  FLONASE Place 2 sprays into both nostrils daily as needed for rhinitis.   furosemide 20 MG tablet Commonly known as:  LASIX Take 1 tablet (20 mg total) by mouth daily. Take 1 tablet (20 mg ) once daily on Monday, Wednesday and Friday. What changed:    how much to take  how to take this  when to take this   glucose blood test strip Use to check blood sugars 2  times daily   glucose blood test strip Commonly known as:  ACCU-CHEK AVIVA PLUS USE TO CHECK GLUCOSE THREE TIMES DAILY   guaiFENesin 600 MG 12 hr tablet Commonly known as:  MUCINEX Take 600 mg by mouth 2 (two) times daily.   guaiFENesin-dextromethorphan 100-10 MG/5ML syrup Commonly known as:  ROBITUSSIN DM Take 5 mLs by mouth every 4 (four) hours as needed for cough.   hydrALAZINE 100 MG tablet Commonly known as:  APRESOLINE Take 1 tablet (100 mg total) by mouth every 8 (eight) hours.   insulin aspart protamine- aspart (70-30) 100 UNIT/ML injection Commonly known as:  NOVOLOG MIX 70/30 Inject 0.18 mLs (18 Units total) into the skin 2 (two) times daily with a meal. What changed:  how much to take   ipratropium-albuterol 0.5-2.5 (3) MG/3ML Soln Commonly known as:  DUONEB Take 3 mLs by nebulization every 6 (six) hours as needed (wheezing, shortness of breath).   isosorbide mononitrate 30 MG 24 hr tablet Commonly known as:  IMDUR Take 1 tablet (30 mg total) by mouth daily.   loperamide 2 MG capsule Commonly known as:  IMODIUM Take 1 capsule (2 mg total) by mouth 4 (four)  times daily as needed for diarrhea or loose stools.   mometasone-formoterol 200-5 MCG/ACT Aero Commonly known as:  DULERA Inhale 2 puffs into the lungs 2 (two) times daily.   nitroGLYCERIN 0.4 MG/SPRAY spray Commonly known as:  NITROLINGUAL Place 1 spray under the tongue every 5 (five) minutes x 3 doses as needed for chest pain.   ondansetron 4 MG tablet Commonly known as:  ZOFRAN Take 4 mg by mouth every 8 (eight) hours as needed for nausea or vomiting.   Potassium Chloride ER 20 MEQ Tbcr Take 20 mEq by mouth daily. While on lasix Start taking on:  June 09, 2018   predniSONE 10 MG tablet Commonly known as:  DELTASONE Take 10 mg by mouth daily with breakfast.   pregabalin 100 MG capsule Commonly known as:  LYRICA Take 100 mg by mouth 2 (two) times daily.   propranolol 10 MG  tablet Commonly known as:  INDERAL Take 1 tablet (10 mg total) by mouth 3 (three) times daily. What changed:  when to take this   rosuvastatin 20 MG tablet Commonly known as:  CRESTOR Take 1 tablet (20 mg total) by mouth daily.   senna 8.6 MG tablet Commonly known as:  SENOKOT Take 1 tablet by mouth 2 (two) times daily as needed for constipation.   ticagrelor 60 MG Tabs tablet Commonly known as:  BRILINTA Take 1 tablet (60 mg total) by mouth 2 (two) times daily.    ***   Past Medical History:  Diagnosis Date  . Acute respiratory failure (HCC) 11/21/2014  . Anxiety   . Arthritis 08/05/2013  . 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 Cardiomyopathy, ischemic    a. EF 35 to 40% by echo in 2013 b. EF improved to 50-55% by echo in 04/2015; 11/2016 Echo: EF 55-60%, Gr1 DD, mildly dil LA, nl RV fxn.  . Cerebral infarct (HCC) 08/17/2013  . Chronic diastolic CHF (congestive heart failure) (HCC)    a. EF 50-55% by echo in 04/2015; b. 11/2016 Echo: EF 55-60%, Gr1 DD.  . COPD (chronic obstructive pulmonary disease) (HCC)   . Coronary artery disease, occlusive    a. Previous PCI to the LAD, LCx, and RCA in 2010, 2011, 2013, and 2016. All performed in [place]; b. 11/2016 Lexiscan MV: EF 43%, no ischemia->low risk.  . 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 Diabetic diarrhea (HCC) 10/03/2014  . 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 Hepatic steatosis    by CT abd pelvis  . History of kidney stones    at a younger age  . Hyperlipidemia LDL goal <100 02/23/2014  . Hypertensive heart disease   . Osteoporosis, post-menopausal   . 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 Pill esophagitis   . Pleural effusion 10/25/2012   10/2012 CT chest >> small to moderate R lung effusion>> chylothorax, 100% lymphs 10/2013 thoracentesis> cytology negative, WBC 1471, >  90% "small lymphs" 01/2014 CT chest> near complete resolution of pleural effusion, stable lymphadenopathy 08/2014 CT chest [place] ([facility]): small right sided effusion decreased in size, stable mediastinal lymphadenopathy 1.0cm largest, 2m  . Pulmonary sarcoidosis (HCC) 12/07/2012   Diagnosed over 20 years ago in [place] with a mediastinal biopsy 03/2013 Full PFT [facility] > UNACCEPTABLE AND NOT REPRODUCIBLE DATA> Ratio 71% FEV 1 1.02 L (55% pred), FVC 1.31 L (49% pred)   could not do lung volumes or DLCO   . Sleep apnea   . Stage III chronic kidney disease (HCC)     Past Surgical History:  Procedure Laterality Date  . ABDOMINAL HYSTERECTOMY     at ge 39. secondary to bleeding/partial  . ANTERIOR CERVICAL DECOMP/DISCECTOMY FUSION N/A 10/10/2016   Procedure: Anterior Cervical Discectomy Fusion - Cervical four4- five - Cervical five-Cervical six - Cervical six-Cervical seven;  Surgeon: Pool, Henry, MD;  Location: [facility] OR;  Service: Neurosurgery;  Laterality: N/A;  . BREAST CYST ASPIRATION Right   . CARPAL TUNNEL RELEASE Bilateral   . CHOLECYSTECTOMY     in [place]   . COLONOSCOPY WITH PROPOFOL N/A 01/09/2015   Procedure: COLONOSCOPY WITH PROPOFOL;  Surgeon: Darren Wohl, MD;  Location: [facility] ENDOSCOPY;  Service: Endoscopy;  Laterality: N/A;  . CORONARY ANGIOPLASTY WITH STENT PLACEMENT     [place]; [facility]  . EYE SURGERY     bil cataracts  . HERNIA REPAIR     umbilical/ Dr Ely  . OOPHORECTOMY    . PTCA  August 2012   Right Posterior tibial artery , [place]  . TOE AMPUTATION  Sept 2012   Right 2nd toe, [place]    No outpatient medications have been marked as taking for the 06/25/18 encounter (Appointment) with ,  M, PA-C.    Allergies:   Cephalexin; Contrast media [iodinated diagnostic agents]; Doxycycline; Gabapentin; and Sulfa antibiotics   Social History:  The patient  reports that she has never smoked. She has never used smokeless  tobacco. She reports that she does not drink alcohol or use drugs.   Family History:  The patient's family history includes Asthma in her grandchild; Breast cancer in her maternal aunt; Heart attack in her brother, father, and sister; Heart disease in her brother, father, and sister; Tremor in her mother.  ROS:   ROS   PHYSICAL EXAM: *** VS:  There were no vitals taken for this visit. BMI: There is no height or weight on file to calculate BMI.  Physical Exam   EKG:  Was ordered and interpreted by me today. Shows ***  Recent Labs: 04/25/2018: ALT 15; B Natriuretic Peptide 282.0; TSH 0.758 06/06/2018: Magnesium 2.0 06/07/2018: Hemoglobin 9.7; Platelets 408 06/08/2018: BUN 13; Creatinine, Ser 1.46; Potassium 3.2; Sodium 140  No results found for requested labs within last 8760 hours.   Estimated Creatinine Clearance: 35.1 mL/min (A) (by C-G formula based on SCr of 1.46 mg/dL (H)).   Wt Readings from Last 3 Encounters:  06/14/18 182 lb 4 oz (82.7 kg)  06/08/18 176 lb 14.4 oz (80.2 kg)  06/01/18 188 lb 9.6 oz (85.5 kg)     Other studies reviewed: Additional studies/records reviewed today include: summarized above  ASSESSMENT AND PLAN:  1. ***  Disposition: F/u with Dr. Gollan or an APP in ***  Current medicines are reviewed at length with the patient today.  The patient did not have any concerns regarding medicines.  Signed,  , PA-C 06/24/2018 8:41 AM     [facility] [address] [place], [place] [address] [phone] 

## 2018-06-24 NOTE — Telephone Encounter (Signed)
FYI

## 2018-06-25 ENCOUNTER — Ambulatory Visit: Payer: Medicare Other | Admitting: Physician Assistant

## 2018-06-25 ENCOUNTER — Other Ambulatory Visit: Payer: Self-pay | Admitting: *Deleted

## 2018-06-25 DIAGNOSIS — I5032 Chronic diastolic (congestive) heart failure: Secondary | ICD-10-CM | POA: Diagnosis not present

## 2018-06-25 DIAGNOSIS — I130 Hypertensive heart and chronic kidney disease with heart failure and stage 1 through stage 4 chronic kidney disease, or unspecified chronic kidney disease: Secondary | ICD-10-CM | POA: Diagnosis not present

## 2018-06-25 DIAGNOSIS — E1122 Type 2 diabetes mellitus with diabetic chronic kidney disease: Secondary | ICD-10-CM | POA: Diagnosis not present

## 2018-06-25 DIAGNOSIS — J449 Chronic obstructive pulmonary disease, unspecified: Secondary | ICD-10-CM | POA: Diagnosis not present

## 2018-06-25 DIAGNOSIS — N184 Chronic kidney disease, stage 4 (severe): Secondary | ICD-10-CM | POA: Diagnosis not present

## 2018-06-25 DIAGNOSIS — D631 Anemia in chronic kidney disease: Secondary | ICD-10-CM | POA: Diagnosis not present

## 2018-06-25 NOTE — Patient Outreach (Signed)
[facility]) Care Management  06/25/2018  ABYGALE KARPF 04/25/1941 [id number]  Transition of care call    Patient admission to [facility] 11/8-11/11 Dx: Near Syncope , fall  Readmission at [facility] 12/15-12/20, Dx: Acute on Chronic Diastolic heart failure . [facility] 12/20-1/13.   Patient with [facility] readmission 1/25-1/28, Dx : Acute pulmonary edema, pleural effusion Acute on chronic respiratory failure with hypoxia. Discharge to [facility] for rehab.  Noted in Epic patient has been discharged to home from SNF.  PMHX significant for Diabetes, Chronic Diastolic Heart failure, COPD, Pulmonary Sacroidosis, Sleep Apnea , CVA.Mild Dementia ,OSA with CPAP, CKD stage 3 .  Outreach call to patient, no answer unable to leave a message.   1400  Successful return call to patient, she reports being discharged from SNF on 2/6. Patient reports that she is doing , taking care of herself, taking it easy and not doing more than she should.  Patient discussed recent admission to hospital and rehab stay.  Patient states that she is not going back and is working on not having to go back to hospital .   She further discussed  Heart Failure She reports weighing daily, today's weight is 182, previous day, 183. Patient reports that her breathing is fine, no shortness of breath , no swelling in her legs.  Patient reports to continue using her CPAP at night.  Diabetes Patient reports that her blood sugar was 96, this morning reviewed symptoms of low blood sugar she denied having , but she ate oatmeal and then she and her husband went out to breakfast at [facility], but she does not add salt to her foods.  Medications  Patient is using pill packaging for her medications, she is not able to review at this time.  Social  Patient living at home with her husband, that is able to provide transportation. Patient is active with [facility] has had nursing visit , and has home PT visit on  today and patient reports that they will be visiting her shortly Patient agreeable to home visit in the next to complete assessments and further care planning and goal setting .   Appointments Patient has post SNF discharge visit with PCP scheduled on 3/2 greater than 14 day, discussed with patient sooner visit states she contact MD , offered assistance.  Noted in Epic patient had an appointment with cardiology scheduled on today,patient she was unaware of visit and she recently visited cardiology , reviewed with patient difference between her heart failure clinic visit and heart doctor, she is agreeable to rescheduling appointment during home visit with her .   Plan  Will plan home visit in the next week.     [name], RN, [place] Management Coordinator  [phone]- Mobile [phone]- Toll Free Main Office

## 2018-06-26 DIAGNOSIS — E509 Vitamin A deficiency, unspecified: Secondary | ICD-10-CM | POA: Diagnosis not present

## 2018-06-26 DIAGNOSIS — E1151 Type 2 diabetes mellitus with diabetic peripheral angiopathy without gangrene: Secondary | ICD-10-CM | POA: Diagnosis not present

## 2018-06-26 DIAGNOSIS — M5440 Lumbago with sciatica, unspecified side: Secondary | ICD-10-CM | POA: Diagnosis not present

## 2018-06-26 DIAGNOSIS — G4733 Obstructive sleep apnea (adult) (pediatric): Secondary | ICD-10-CM | POA: Diagnosis not present

## 2018-06-26 DIAGNOSIS — J441 Chronic obstructive pulmonary disease with (acute) exacerbation: Secondary | ICD-10-CM | POA: Diagnosis not present

## 2018-06-26 DIAGNOSIS — D869 Sarcoidosis, unspecified: Secondary | ICD-10-CM | POA: Diagnosis not present

## 2018-06-26 DIAGNOSIS — E1122 Type 2 diabetes mellitus with diabetic chronic kidney disease: Secondary | ICD-10-CM | POA: Diagnosis not present

## 2018-06-26 DIAGNOSIS — M4802 Spinal stenosis, cervical region: Secondary | ICD-10-CM | POA: Diagnosis not present

## 2018-06-26 DIAGNOSIS — I69391 Dysphagia following cerebral infarction: Secondary | ICD-10-CM | POA: Diagnosis not present

## 2018-06-26 DIAGNOSIS — J9621 Acute and chronic respiratory failure with hypoxia: Secondary | ICD-10-CM | POA: Diagnosis not present

## 2018-06-26 DIAGNOSIS — N184 Chronic kidney disease, stage 4 (severe): Secondary | ICD-10-CM | POA: Diagnosis not present

## 2018-06-26 DIAGNOSIS — K760 Fatty (change of) liver, not elsewhere classified: Secondary | ICD-10-CM | POA: Diagnosis not present

## 2018-06-26 DIAGNOSIS — M1990 Unspecified osteoarthritis, unspecified site: Secondary | ICD-10-CM | POA: Diagnosis not present

## 2018-06-26 DIAGNOSIS — I130 Hypertensive heart and chronic kidney disease with heart failure and stage 1 through stage 4 chronic kidney disease, or unspecified chronic kidney disease: Secondary | ICD-10-CM | POA: Diagnosis not present

## 2018-06-26 DIAGNOSIS — L84 Corns and callosities: Secondary | ICD-10-CM | POA: Diagnosis not present

## 2018-06-26 DIAGNOSIS — F411 Generalized anxiety disorder: Secondary | ICD-10-CM | POA: Diagnosis not present

## 2018-06-26 DIAGNOSIS — D631 Anemia in chronic kidney disease: Secondary | ICD-10-CM | POA: Diagnosis not present

## 2018-06-26 DIAGNOSIS — M810 Age-related osteoporosis without current pathological fracture: Secondary | ICD-10-CM | POA: Diagnosis not present

## 2018-06-26 DIAGNOSIS — R1310 Dysphagia, unspecified: Secondary | ICD-10-CM | POA: Diagnosis not present

## 2018-06-26 DIAGNOSIS — F0391 Unspecified dementia with behavioral disturbance: Secondary | ICD-10-CM | POA: Diagnosis not present

## 2018-06-26 DIAGNOSIS — M5412 Radiculopathy, cervical region: Secondary | ICD-10-CM | POA: Diagnosis not present

## 2018-06-26 DIAGNOSIS — I2510 Atherosclerotic heart disease of native coronary artery without angina pectoris: Secondary | ICD-10-CM | POA: Diagnosis not present

## 2018-06-26 DIAGNOSIS — E1140 Type 2 diabetes mellitus with diabetic neuropathy, unspecified: Secondary | ICD-10-CM | POA: Diagnosis not present

## 2018-06-26 DIAGNOSIS — M109 Gout, unspecified: Secondary | ICD-10-CM | POA: Diagnosis not present

## 2018-06-26 DIAGNOSIS — I5032 Chronic diastolic (congestive) heart failure: Secondary | ICD-10-CM | POA: Diagnosis not present

## 2018-06-28 ENCOUNTER — Encounter: Payer: Self-pay | Admitting: *Deleted

## 2018-06-28 ENCOUNTER — Other Ambulatory Visit: Payer: Self-pay | Admitting: *Deleted

## 2018-06-28 DIAGNOSIS — I130 Hypertensive heart and chronic kidney disease with heart failure and stage 1 through stage 4 chronic kidney disease, or unspecified chronic kidney disease: Secondary | ICD-10-CM | POA: Diagnosis not present

## 2018-06-28 DIAGNOSIS — J9621 Acute and chronic respiratory failure with hypoxia: Secondary | ICD-10-CM | POA: Diagnosis not present

## 2018-06-28 DIAGNOSIS — J441 Chronic obstructive pulmonary disease with (acute) exacerbation: Secondary | ICD-10-CM | POA: Diagnosis not present

## 2018-06-28 DIAGNOSIS — N184 Chronic kidney disease, stage 4 (severe): Secondary | ICD-10-CM | POA: Diagnosis not present

## 2018-06-28 DIAGNOSIS — E1122 Type 2 diabetes mellitus with diabetic chronic kidney disease: Secondary | ICD-10-CM | POA: Diagnosis not present

## 2018-06-28 DIAGNOSIS — I5032 Chronic diastolic (congestive) heart failure: Secondary | ICD-10-CM | POA: Diagnosis not present

## 2018-06-28 NOTE — Patient Outreach (Addendum)
[facility]) Care Management   06/28/2018  Pamela Collier 12-12-1940 [id number]  Pamela Collier is an 78 y.o. female   Transition of care home visit   Patient admission to [facility] 11/8-11/11 Dx: Near Syncope , fall  Readmission at [facility] 12/15-12/20, Dx: Acute on Chronic Diastolic heart failure . [facility] 12/20-1/13.  Patient with [facility] readmission 1/25-1/28, Dx : Acute pulmonary edema, pleural effusion Acute on chronic respiratory failure with hypoxia. Discharge to [facility] for rehab. Patient discharged home on 2/12.   Subjective:  Patient reports feeling pretty good except for having a little cold, runny nose, cough rare, no fever.  Patient states that she does not want to have to go back to the hospital or rehab anytime soon.  Patient discussed just not feeling like doing much, has not weighed on today .  Patient discussed frustration with multiple call and visit and having difficulty keeping up with who is coming when.   Objective:  Patient resting on sofa on arrival, ambulating in home , needed reminding to use her walker . BP (!) 158/62 (BP Location: Right Arm, Patient Position: Sitting, Cuff Size: Normal)   Pulse 67   Resp 18   Ht 1.702 m (_0 )   Wt 182 lb (82.6 kg) Comment: patient report of recent weight  BMI 28.51 kg/m  Review of Systems  Constitutional: Negative.   HENT: Negative.   Eyes: Negative.   Respiratory: Positive for cough. Negative for sputum production, shortness of breath and wheezing.        Non productive   Cardiovascular: Positive for leg swelling.  Gastrointestinal: Negative.   Genitourinary: Negative.   Musculoskeletal: Positive for joint pain.  Skin: Negative.   Neurological: Negative.   Endo/Heme/Allergies: Negative.   Psychiatric/Behavioral: Positive for memory loss.    Physical Exam  Constitutional: She is oriented to person, place, and time. She appears well-developed and well-nourished.  Cardiovascular:  Normal rate, normal heart sounds and intact distal pulses.  Respiratory: Effort normal and breath sounds normal.  GI: Soft.  Neurological: She is alert and oriented to person, place, and time.  Skin: Skin is warm and dry.    Encounter Medications:   Outpatient Encounter Medications as of 06/28/2018  Medication Sig Note  . acetaminophen (TYLENOL) 500 MG tablet Take 1,000 mg by mouth every 6 (six) hours as needed for mild pain or headache.    . allopurinol (ZYLOPRIM) 100 MG tablet Take 1 tablet (100 mg total) by mouth daily.   [name] ALPRAZolam (XANAX) 0.25 MG tablet Take 1 tablet (0.25 mg total) by mouth daily.   [name] amLODipine (NORVASC) 5 MG tablet Take 2 tablets (10 mg total) by mouth daily.   [name] aspirin EC 81 MG tablet Take 81 mg by mouth daily.   . B-D UF III MINI PEN NEEDLES 31G X 5 MM MISC USE THREE TIMES A DAY   . blood glucose meter kit and supplies Dispense based on patient and insurance preference. Use up to three times daily as directed. 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 dicyclomine (BENTYL) 10 MG capsule Take 1 capsule (10 mg total) by mouth 4 (four) times daily -  before meals and at bedtime.   . diphenoxylate-atropine (LOMOTIL) 2.5-0.025 MG tablet Take 1 tablet by mouth 4 (four) times daily as needed for diarrhea or loose stools.   . fluticasone (FLONASE) 50 MCG/ACT nasal spray Place 2 sprays into both nostrils daily as needed for rhinitis.   . furosemide (  LASIX) 20 MG tablet Take 1 tablet (20 mg total) by mouth daily. Take 1 tablet (20 mg ) once daily on Monday, Wednesday and Friday.   [name] glucose blood (ACCU-CHEK AVIVA PLUS) test strip USE TO CHECK GLUCOSE THREE TIMES DAILY   . glucose blood test strip Use to check blood sugars 2 times daily   . isosorbide mononitrate (IMDUR) 30 MG 24 hr tablet Take 1 tablet (30 mg total) by mouth daily.   . mometasone-formoterol (DULERA) 200-5 MCG/ACT AERO Inhale 2 puffs into the lungs 2 (two) times daily.   . nitroGLYCERIN (NITROLINGUAL) 0.4 MG/SPRAY spray Place  1 spray under the tongue every 5 (five) minutes x 3 doses as needed for chest pain.   . pregabalin (LYRICA) 100 MG capsule Take 100 mg by mouth 2 (two) times daily.    . propranolol (INDERAL) 10 MG tablet Take 1 tablet (10 mg total) by mouth 3 (three) times daily. 06/28/2018: Taking daily per pill pack  . rosuvastatin (CRESTOR) 20 MG tablet Take 1 tablet (20 mg total) by mouth daily.   . ticagrelor (BRILINTA) 60 MG TABS tablet Take 1 tablet (60 mg total) by mouth 2 (two) times daily.   [name] guaiFENesin (MUCINEX) 600 MG 12 hr tablet Take 600 mg by mouth 2 (two) times daily.   [name] guaiFENesin-dextromethorphan (ROBITUSSIN DM) 100-10 MG/5ML syrup Take 5 mLs by mouth every 4 (four) hours as needed for cough. (Patient not taking: Reported on 06/28/2018)   . insulin aspart protamine- aspart (NOVOLOG MIX 70/30) (70-30) 100 UNIT/ML injection Inject 0.18 mLs (18 Units total) into the skin 2 (two) times daily with a meal.   . ipratropium-albuterol (DUONEB) 0.5-2.5 (3) MG/3ML SOLN Take 3 mLs by nebulization every 6 (six) hours as needed (wheezing, shortness of breath). (Patient not taking: Reported on 06/28/2018)   . ondansetron (ZOFRAN) 4 MG tablet Take 4 mg by mouth every 8 (eight) hours as needed for nausea or vomiting.   . predniSONE (DELTASONE) 10 MG tablet Take 10 mg by mouth daily with breakfast.   . senna (SENOKOT) 8.6 MG tablet Take 1 tablet by mouth 2 (two) times daily as needed for constipation.    Facility-Administered Encounter Medications as of 06/28/2018  Medication  . albuterol (PROVENTIL) (5 MG/ML) 0.5% nebulizer solution 2.5 mg    Functional Status:   In your present state of health, do you have any difficulty performing the following activities: 06/28/2018 06/14/2018  Hearing? N N  Vision? N N  Difficulty concentrating or making decisions? Tempie Donning  Walking or climbing stairs? Y Y  Comment - -  Dressing or bathing? N Y  Doing errands, shopping? Y Y  Comment husband helps  -  Conservation officer, nature and  eating ? Y -  Comment husband helps , with meals and patient has meals on wheels  -  Using the Toilet? N -  In the past six months, have you accidently leaked urine? Y -  Do you have problems with loss of bowel control? N -  Managing your Medications? Y -  Comment - -  Managing your Finances? Y -  Housekeeping or managing your Housekeeping? Y -  Comment husband helps  -  Some recent data might be hidden    Fall/Depression Screening:    Fall Risk  06/28/2018 06/14/2018 05/27/2018  Falls in the past year? _0 Comment - - -  Number falls in past yr: 1 0 1  Injury with Fall? 0 0 1  Risk  Factor Category  - - -  Risk for fall due to : Impaired balance/gait;History of fall(s) - Impaired mobility;Impaired balance/gait;History of fall(s)  Risk for fall due to: Comment - - -  Follow up - - -   PHQ 2/9 Scores 06/28/2018 05/27/2018 04/23/2018 08/24/2017 11/10/2016 07/01/2016 02/12/2016  PHQ - 2 Score _0 0 0 3  PHQ- 9 Score - - - 6 - - 7  Exception Documentation - - - - - - -    Assessment:  Routine home visit. Patient is active with [facility] home health , Home physical therapist arrived during home visit.  Patient husband [name] present during visit.    Heart failure; patient has [facility] calendar no weights documented since discharge home. Patient gives verbal report of weight being 183 she has not been keeping log and hasn't weighed daily.   Patient has [facility] calendar for recording weights .  Advised regarding importance of weighing daily, patient agreeable . Reviewed symptoms of worsening symptoms of heart failure, denies increased shortness of breath, no wheezing or rales heard on assessment. Patient does have edema at calf area  noted especially above socks. Patient reports wearing compression socks usually but does not have on today.  Discussed paramedicine program with paramedic visiting her home for education and support of Heart failure , she is interested but not right now with visits from home  health .  Cold symptoms   - sniffling noted during visit, reports occasionally sneezing,  no cough during visit no temperature. Offered notifying PCP office for sooner visit patient declined stating she would do it if needed.  Diabetes  Reviewed meter, patient has checked blood sugar 5 times since dc home on 2/12/. She reports taking her insulin twice daily 2/13- am 84, pm 177 2/14 - 96 am.  2/15 - 201 am, 164 pm 2/16- 66, had juice and ate breakfast no recheck noted.  2/17 - 199 at 0900, this am , rechecked at 1300 with reading 153.  Patient reports usually takes 45 units of 70/30 units , but states it depends on what her blood sugar is, and how she eats,  further questioning patient reports taking less insulin if blood sugar lower unable to provide example .  Reinforced importance of monitoring blood sugars, with taking insulin . Discussed low blood sugar symptoms and treatment, patient able to state low blood sugar reading, symptom and how to treat reinforced rechecking blood sugar.  Patient will continue to benefit from additional education and support with diabetes.   Fall risk/Safety Patient high fall risk, using cane and hurry cane in home, Patient has to climb 3 steps in home to get from kitchen to living area.  Safety recommendations per therapy to secure rugs that they have in bathroom. Patient will benefit from continued home health therapy.  Hypertension  Patient declines monitoring blood pressure at home at this time due to many home visits at this time  and states too much going on. Patient reports limiting salt in diet .  Medications  Patient reports that she is taking medications per compliance packaging. Upon review noted patient using older pill package that does not include all medications on  recent medication list from rehab at discharge. Husband able to locate discharge paperwork from [facility] SN[facility] that includes medication list , prior to this I had placed call to [facility]  regarding need for patient medication list, able to leave a voicemail message with medical record department.   Patient  husband able to find most recent pill packaged medication  from [place]. [facility] to [facility] they are  agreeable to sending list to [facility], fax number provided.  Patient husband concern regarding patient being forgetful regarding taking medications.  Patient reports trying to use of older pill package before using newer package. Education on importance of using newer package with accurate list.  Advised regarding   fluid pill lasix, that is not included in older package. Patient able to state  understanding that lasix is ordered MWF.  Patient will benefit from [place] referral, on greater than 10 medications,  3 admits in the last 3 months and 2 SNF stays.  Appointments Patient missed cardiology visit scheduled for 2/14, agreeable to RN rescheduling visit , call office visit rescheduled for 2/21 at 2 pm, patient and husband aware, date written down.  Discussed PCP visit scheduled on 3/2, offered calling office to schedule a sooner visit, patient has declined stating that she will do it.     Plan:  Will send PCP visit note , will send in basket message regarding concern regarding patient reported insulin dosing, one noted episode of hypoglycemia.  Reinforced heart failure worsening symptoms and when to call MD for worsening symptoms of weight gain of 2 pounds in a day , 5 in a week, increased shortness of breath, swelling.  Advised to notify MD sooner for worsening of cough symptoms, change in sputum, increased weakness , fever.   Fall prevention measures reinforced.  Will [facility] pharmacy consult related to frequent admissions/SNF stays  Will continue weekly outreaches for transition of care  Will plan  care coordination call to [facility] .    [facility] CM Care Plan Problem One     Most Recent Value  Care Plan Problem One  At risk for readmission related  to recent discharge from rehab after hospital admission for Heart failure   Role Documenting the Problem One  Care Management [place] for Problem One  Active  [facility] Long Term Goal   Patient will not experience a hospital admission over the next 60 days   [facility] Long Term Goal Start Date  06/25/18  Interventions for Problem One Long Term Goal  Home visit completed. encouraged regarding taking medications as prescribed, advised importance of earlier notification to MD for worsening symptoms for sooner visit  to avoid readmission, notifying of worsening of cold symptoms signs of infections fever, change in sputum color , decreased appetite increase in weakness   [facility] CM Short Term Goal #1   Over the next 30 days patient will be able to report weighing self daily and keeping a record  [facility] CM Short Term Goal #1 Start Date  06/25/18  Interventions for Short Term Goal #1  Advised regarding best time of day to weigh, early morning after going to the bathroom and before dressing . Reviewed in [facility] book how to record daily weighs    [facility] CM Short Term Goal #2   Patient will be able to report attending all medical appointments over the next 20  [facility] CM Short Term Goal #2 Start Date  06/25/18  Interventions for Short Term Goal #2  Assisted with rescheduling cardiology visit, provided handwritten sticky note with appointment date and time, husband made aware . Reinforced attending PCP visit and scheduling sooner visit .   [facility] CM Short Term Goal #3  Patient will be able to verbalize at least 2 symptoms of worsening Heart Failure over  the next 30 days   [facility] CM Short Term Goal #3 Start Date  06/28/18  Interventions for Short Tern Goal #3  Reviewed worsening yellow zone symptoms of heart faiilure , increase in weight of 2 pounds in a day and 5 in a week, increased shortness of breath swelling. reviewed action plan of notifying MD .     [facility] CM Care Plan Problem Two     Most Recent Value  Care Plan Problem Two   Diabetes self care management knowledge deficit   Role Documenting the Problem Two  Care Management Coordinator  Care Plan for Problem Two  Active  Interventions for Problem Two Long Term Goal   Discussed symptoms of low blood sugar, low blood reading of 70 and rule of 15 for treating blood sugar , encouraged to notify MD of increased episode of low blood sugar.   [facility] Long Term Goal  Patient  will be able to verbalize at least 2 measures to managing diabetes control over the next 60 days  [facility] Long Term Goal Start Date  06/28/18  [facility] CM Short Term Goal #1   Patient will be able to report monitoring blood sugars at least 2 twice daily over the next 30 days and keeping a record.   [facility] CM Short Term Goal #1 Start Date  06/28/18  Interventions for Short Term Goal #2   Discussed with patient importance of monitoring blood sugar with taking insulin , to avoid low blood sugar episodes       [name], RN, [place] Management Coordinator  [phone]- Mobile [phone]- [place]

## 2018-06-29 ENCOUNTER — Telehealth: Payer: Self-pay | Admitting: Internal Medicine

## 2018-06-29 DIAGNOSIS — J441 Chronic obstructive pulmonary disease with (acute) exacerbation: Secondary | ICD-10-CM | POA: Diagnosis not present

## 2018-06-29 DIAGNOSIS — I5032 Chronic diastolic (congestive) heart failure: Secondary | ICD-10-CM | POA: Diagnosis not present

## 2018-06-29 DIAGNOSIS — E1122 Type 2 diabetes mellitus with diabetic chronic kidney disease: Secondary | ICD-10-CM | POA: Diagnosis not present

## 2018-06-29 DIAGNOSIS — J9621 Acute and chronic respiratory failure with hypoxia: Secondary | ICD-10-CM | POA: Diagnosis not present

## 2018-06-29 DIAGNOSIS — I130 Hypertensive heart and chronic kidney disease with heart failure and stage 1 through stage 4 chronic kidney disease, or unspecified chronic kidney disease: Secondary | ICD-10-CM | POA: Diagnosis not present

## 2018-06-29 DIAGNOSIS — N184 Chronic kidney disease, stage 4 (severe): Secondary | ICD-10-CM | POA: Diagnosis not present

## 2018-06-29 NOTE — Telephone Encounter (Signed)
Copied from [place] [id number]. Topic: Quick Communication - Home Health Verbal Orders >> Jun 29, 2018  2:38 PM Carolyn Stare wrote: Grace Isaac a OT with  [facility]   Callback Number   [phone]  Requesting OT   Frequency 1 X 4

## 2018-06-29 NOTE — Telephone Encounter (Signed)
Returned call to [place] with [employer].  Gave verbal orders for OT as requested.

## 2018-06-30 ENCOUNTER — Other Ambulatory Visit: Payer: Self-pay | Admitting: Pharmacist

## 2018-06-30 ENCOUNTER — Emergency Department
Admission: EM | Admit: 2018-06-30 | Discharge: 2018-06-30 | Disposition: A | Discharge status: Home / Self Care | Payer: Medicare Other | Attending: Emergency Medicine | Admitting: Emergency Medicine

## 2018-06-30 ENCOUNTER — Emergency Department: Payer: Medicare Other

## 2018-06-30 ENCOUNTER — Encounter: Payer: Self-pay | Admitting: Emergency Medicine

## 2018-06-30 DIAGNOSIS — I110 Hypertensive heart disease with heart failure: Secondary | ICD-10-CM | POA: Diagnosis not present

## 2018-06-30 DIAGNOSIS — I5032 Chronic diastolic (congestive) heart failure: Secondary | ICD-10-CM | POA: Diagnosis not present

## 2018-06-30 DIAGNOSIS — M545 Low back pain, unspecified: Secondary | ICD-10-CM

## 2018-06-30 DIAGNOSIS — E785 Hyperlipidemia, unspecified: Secondary | ICD-10-CM | POA: Insufficient documentation

## 2018-06-30 DIAGNOSIS — E119 Type 2 diabetes mellitus without complications: Secondary | ICD-10-CM | POA: Insufficient documentation

## 2018-06-30 DIAGNOSIS — Z79899 Other long term (current) drug therapy: Secondary | ICD-10-CM | POA: Insufficient documentation

## 2018-06-30 MED ORDER — TRAMADOL HCL 50 MG PO TABS
50.0000 mg | ORAL_TABLET | Freq: Once | ORAL | Status: AC
  Administered 2018-06-30: 50 mg via ORAL
  Filled 2018-06-30: qty 1

## 2018-06-30 MED ORDER — TRAMADOL HCL 50 MG PO TABS: 50.0000 mg | ORAL_TABLET | Freq: Two times a day (BID) | ORAL | 0 refills | Status: AC | PRN

## 2018-06-30 NOTE — Patient Outreach (Signed)
[facility]) Care Management  06/30/2018  AVICE FUNCHESS 09-19-1940 [id number]  Received referral regarding medication management for this patient. Contacted patient; unable to leave message on voicemail. Contacted her son, [place], [place], and discussed Ms. Rhude coming into the office for a medication review. He agreed. Scheduled appointment this coming Monday on 07/05/2018 at 1 pm. Lou Miner to bring all of Ms. Rigsby medications, blood sugar readings, weights.   Catie Darnelle Maffucci, PharmD, [place] PGY2 Ambulatory Care Pharmacy Resident, [place] [facility] Phone: [phone]

## 2018-06-30 NOTE — ED Notes (Signed)
First Nurse Note: Patient placed in [facility], complaining of back pain post fall in [place] yesterday.

## 2018-06-30 NOTE — ED Notes (Signed)
Esign not working at this time. Pt verbalized discharge instructions and has no questions at this time. 

## 2018-06-30 NOTE — ED Notes (Signed)
Pt used walker and was able to ambulate to bathroom

## 2018-06-30 NOTE — ED Triage Notes (Signed)
Pt reports tripped and fell yesterday hurting her lower back. Pt reports painful when she walks.

## 2018-06-30 NOTE — Discharge Instructions (Signed)
Please use your walker until your back pain has improved.  Follow up with orthopedics if not improving over the week.  Return to the ER for symptoms that change or worsen if unable to see primary care or orthopedics.

## 2018-06-30 NOTE — ED Provider Notes (Signed)
[facility] Emergency Department Provider Note ____________________________________________  Time seen: Approximately 12:56 PM  I have reviewed the triage vital signs and the nursing notes.   HISTORY  Chief Complaint Back Pain and Fall    HPI Pamela Collier is a 78 y.o. female who presents to the emergency department for evaluation and treatment of low back pain after a mechanical, non-syncopal fall yesterday.  She states that she landed directly on her lower back.  She was at her home when this happened.  Since that time, she has had pain with ambulation.  She has taken some Tylenol without any relief. Past Medical History:  Diagnosis Date  . Acute respiratory failure ([place]) 11/21/2014  . Anxiety   . Arthritis 08/05/2013  . Atherosclerotic peripheral vascular disease with gangrene [facility]) august 2012  . Cardiomyopathy, ischemic    a. EF 35 to 40% by echo in 2013 b. EF improved to 50-55% by echo in 04/2015; 11/2016 Echo: EF 55-60%, Gr1 DD, mildly dil LA, nl RV fxn.  . Cerebral infarct ([place]) 08/17/2013  . Chronic diastolic CHF (congestive heart failure) (HCC)    a. EF 50-55% by echo in 04/2015; b. 11/2016 Echo: EF 55-60%, Gr1 DD.  [name] COPD (chronic obstructive pulmonary disease) ([place])   . Coronary artery disease, occlusive    a. Previous PCI to the LAD, LCx, and RCA in 2010, 2011, 2013, and 2016. All performed in [place]; b. 11/2016 Lexiscan MV: EF 43%, no ischemia->low risk.  . 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 Diabetic diarrhea ([place]) 10/03/2014  . DM type 2, uncontrolled, with renal complications ([place]) 09/14/2561  . Hepatic steatosis    by CT abd pelvis  . History of kidney stones    at a younger age  . Hyperlipidemia LDL goal <100 02/23/2014  . Hypertensive heart disease   . Osteoporosis, post-menopausal   . 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 Pill esophagitis   . Pleural  effusion 10/25/2012   10/2012 CT chest >> small to moderate R lung effusion>> chylothorax, 100% lymphs 10/2013 thoracentesis> cytology negative, WBC 1471, > 90% "small lymphs" 01/2014 CT chest> near complete resolution of pleural effusion, stable lymphadenopathy 08/2014 CT chest [place] ([facility]): small right sided effusion decreased in size, stable mediastinal lymphadenopathy 1.0cm largest, 24m . 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 Sleep apnea   . Stage III chronic kidney disease ([facility]     Patient Active Problem List   Diagnosis Date Noted  . HCAP (healthcare-associated pneumonia) 06/05/2018  . Decubitus ulcer of sacral region, stage 1 06/02/2018  . Lymphedema 05/25/2018  . Acute on chronic diastolic heart failure ([place] 04/25/2018  . Atherosclerosis of both carotid arteries 04/18/2018  . Near syncope 03/19/2018  . Functional diarrhea 03/13/2018  . Vitamin a deficiency, unspecified 02/22/2018  . Hypercalcemia 02/20/2018  . Environmental and seasonal allergies 01/15/2018  . Post-nasal drip 01/15/2018  . Restless legs 10/17/2017  . Normocytic anemia, not due to blood loss 09/27/2017  . Melena 09/25/2017  . Cognitive decline 08/16/2017  . Polypharmacy 07/28/2017  . CKD (chronic kidney disease) stage 3, GFR 30-59 ml/min (HCC)  11/15/2016  . History of fall 11/12/2016  . Osteoarthritis 11/10/2016  . Cervical stenosis of spinal canal 10/10/2016  . Cervical radiculopathy at C7 07/02/2016  . Epigastric hernia 04/01/2016  . Coronary artery disease   . Type 2 DM with CKD stage 3 and hypertension ([place])   . Chronic renal insufficiency   . Hypertensive heart disease   . OSA on CPAP 07/07/2015  . Hospital discharge follow-up 07/07/2015  .  Pleural effusion, right   . Chronic diastolic CHF (congestive heart failure) ([place]) 04/18/2015  . Diabetic neuropathy ([place]) 02/21/2015  . Benign neoplasm of sigmoid colon   . Low back pain with radiation 04/04/2014  . Essential hypertension 04/04/2014  . Hyperlipidemia LDL goal <70 02/23/2014  . Long-term use of high-risk medication 02/23/2014  . Other long term (current) drug therapy 02/23/2014  . Dermatophytic onychia 08/17/2013  . Stable angina ([place]) 08/17/2013  . Cerebral infarct ([place]) 08/17/2013  . Bony exostosis 08/17/2013  . Obesity, unspecified 08/17/2013  . Arthritis 08/05/2013  . Unsteadiness on feet 03/24/2013  . History of colonic polyps 02/20/2013  . Sarcoidosis ([place]) 12/07/2012  . Dysphagia 12/07/2012  . Pulmonary nodule seen on imaging study 05/20/2012  . 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 Diabetic nephropathy associated with type 2 diabetes mellitus ([place]) 02/29/2012  . Cardiomyopathy, ischemic   . Hepatic steatosis   . Osteoporosis, post-menopausal   . Peripheral vascular disease due to secondary diabetes mellitus ([place])   . Double vessel coronary artery disease 12/14/2011    Past Surgical History:  Procedure Laterality Date  . ABDOMINAL HYSTERECTOMY     at ge 39. secondary to bleeding/partial  . ANTERIOR CERVICAL DECOMP/DISCECTOMY FUSION N/A 10/10/2016   Procedure: Anterior Cervical Discectomy Fusion - Cervical four4- five - Cervical five-Cervical six - Cervical six-Cervical seven;  Surgeon: Earnie Larsson, MD;  Location: [place];  Service: Neurosurgery;  Laterality: N/A;  . BREAST CYST ASPIRATION Right   . CARPAL TUNNEL RELEASE Bilateral   . CHOLECYSTECTOMY     in [place]   . COLONOSCOPY WITH PROPOFOL N/A 01/09/2015   Procedure: COLONOSCOPY WITH PROPOFOL;  Surgeon: Lucilla Lame, MD;  Location: [facility] ENDOSCOPY;  Service: Endoscopy;  Laterality: N/A;  . CORONARY ANGIOPLASTY WITH STENT PLACEMENT     [place]; [facility]  . EYE SURGERY      bil cataracts  . HERNIA REPAIR     umbilical/ Dr Pat Patrick  . OOPHORECTOMY    . PTCA  August 2012   Right Posterior tibial artery , [place]  . TOE AMPUTATION  Sept 2012   Right 2nd toe, [place]    Prior to Admission medications   Medication Sig Start Date End Date Taking? Authorizing Provider  acetaminophen (TYLENOL) 500 MG tablet Take 1,000 mg by mouth every 6 (six) hours as needed for mild pain or headache.     [provider]  allopurinol (ZYLOPRIM) 100 MG tablet Take 1 tablet (100 mg total) by mouth daily. 03/22/18 03/22/19  Loletha Grayer, MD  ALPRAZolam [name]) 0.25 MG tablet Take 1 tablet (0.25 mg total) by mouth daily. 06/08/18   Gladstone Lighter, MD  amLODipine (NORVASC) 5 MG tablet Take 2 tablets (10 mg total) by mouth daily. 04/30/18   Henreitta Leber, MD  aspirin EC 81 MG tablet Take 81 mg by mouth daily.    [provider]  B-D UF III MINI PEN NEEDLES 31G X 5 MM MISC USE THREE TIMES A DAY 02/18/18   [name],  MD  blood glucose meter kit and supplies Dispense based on patient and insurance preference. Use up to three times daily as directed. (FOR ICD-10 E11.21) 06/03/18   [name], MD  dicyclomine (BENTYL) 10 MG capsule Take 1 capsule (10 mg total) by mouth 4 (four) times daily -  before meals and at bedtime. 03/10/18   [name], MD  diphenoxylate-atropine (LOMOTIL) 2.5-0.025 MG tablet Take 1 tablet by mouth 4 (four) times daily as needed for diarrhea or loose stools. 06/08/18   Gladstone Lighter, MD  fluticasone (FLONASE) 50 MCG/ACT nasal spray Place 2 sprays into both nostrils daily as needed for rhinitis. 01/20/18   [name], MD  furosemide (LASIX) 20 MG tablet Take 1 tablet (20 mg total) by mouth daily. Take 1 tablet (20 mg ) once daily on Monday, Wednesday and Friday. 06/08/18   Gladstone Lighter, MD  glucose blood (ACCU-CHEK AVIVA PLUS) test strip USE TO CHECK GLUCOSE THREE TIMES DAILY 02/16/18   [name], MD  glucose blood  test strip Use to check blood sugars 2 times daily 02/16/18   [name], MD  guaiFENesin (MUCINEX) 600 MG 12 hr tablet Take 600 mg by mouth 2 (two) times daily.    [provider]  guaiFENesin-dextromethorphan (ROBITUSSIN DM) 100-10 MG/5ML syrup Take 5 mLs by mouth every 4 (four) hours as needed for cough. Patient not taking: Reported on 06/28/2018 04/30/18   Henreitta Leber, MD  insulin aspart protamine- aspart (NOVOLOG MIX 70/30) (70-30) 100 UNIT/ML injection Inject 0.18 mLs (18 Units total) into the skin 2 (two) times daily with a meal. 06/08/18   Gladstone Lighter, MD  ipratropium-albuterol (DUONEB) 0.5-2.5 (3) MG/3ML SOLN Take 3 mLs by nebulization every 6 (six) hours as needed (wheezing, shortness of breath). Patient not taking: Reported on 06/28/2018 06/08/18   Gladstone Lighter, MD  isosorbide mononitrate (IMDUR) 30 MG 24 hr tablet Take 1 tablet (30 mg total) by mouth daily. 03/03/18   Minna Merritts, MD  mometasone-formoterol (DULERA) 200-5 MCG/ACT AERO Inhale 2 puffs into the lungs 2 (two) times daily. 02/01/18   [name], MD  nitroGLYCERIN (NITROLINGUAL) 0.4 MG/SPRAY spray Place 1 spray under the tongue every 5 (five) minutes x 3 doses as needed for chest pain. 01/14/17   [name], MD  ondansetron (ZOFRAN) 4 MG tablet Take 4 mg by mouth every 8 (eight) hours as needed for nausea or vomiting.    [provider]  predniSONE (DELTASONE) 10 MG tablet Take 10 mg by mouth daily with breakfast.    [provider]  pregabalin (LYRICA) 100 MG capsule Take 100 mg by mouth 2 (two) times daily.     [provider]  propranolol (INDERAL) 10 MG tablet Take 1 tablet (10 mg total) by mouth 3 (three) times daily. 06/08/18   Gladstone Lighter, MD  rosuvastatin (CRESTOR) 20 MG tablet Take 1 tablet (20 mg total) by mouth daily. 04/16/18   [name], MD  senna (SENOKOT) 8.6 MG tablet Take 1 tablet by mouth 2 (two) times daily as needed for  constipation.    [provider]  ticagrelor (BRILINTA) 60 MG TABS tablet Take 1 tablet (60 mg total) by mouth 2 (two) times daily. 04/22/18   Minna Merritts, MD  traMADol (ULTRAM) 50 MG tablet Take 1 tablet (50 mg total) by mouth 2 (two) times daily as needed. 06/30/18   Victorino Dike, FNP    Allergies Cephalexin; Contrast media [iodinated diagnostic agents];  Doxycycline; Gabapentin; and Sulfa antibiotics  Family History  Problem Relation Age of Onset  . Heart disease Father   . Heart attack Father   . Heart disease Sister   . Heart attack Sister   . Heart disease Brother   . Heart attack Brother   . Tremor Mother   . Asthma Grandchild   . Breast cancer Maternal Aunt     Social History Social History   Tobacco Use  . Smoking status: Never Smoker  . Smokeless tobacco: Never Used  Substance Use Topics  . Alcohol use: No    Alcohol/week: 0.0 standard drinks  . Drug use: No    Review of Systems Constitutional: Negative for fever. Cardiovascular: Negative for chest pain. Respiratory: Negative for shortness of breath. Musculoskeletal: Positive for low back pain Skin: Negative for open wounds or lesions Neurological: Negative for decrease in sensation, negative for loss of bowel or bladder control, negative for weakness in any extremity, negative for radiculopathy.  ____________________________________________   PHYSICAL EXAM:  VITAL SIGNS: ED Triage Vitals  Enc Vitals Group     BP 06/30/18 1105 (!) 130/48     Pulse Rate 06/30/18 1105 64     Resp 06/30/18 1105 16     Temp 06/30/18 1105 98.8 F (37.1 C)     Temp Source 06/30/18 1105 Oral     SpO2 06/30/18 1105 95 %     Weight 06/30/18 1136 182 lb (82.6 kg)     Height 06/30/18 1136 5' 7" (1.702 m)     Head Circumference --      Peak Flow --      Pain Score 06/30/18 1136 10     Pain Loc --      Pain Edu? --      Excl. in [place]? --     Constitutional: Alert and oriented. Well appearing and in no  acute distress. Eyes: Conjunctivae are clear without discharge or drainage Head: Atraumatic Neck: Supple.  No midline tenderness. Respiratory: No cough. Respirations are even and unlabored. Musculoskeletal: Diffuse lumbar tenderness on palpation without any noted step-off or deformity.  Bilateral straight leg raise is negative. Neurologic: Motor and sensory function is intact. Skin: Intact Psychiatric: Affect and behavior are appropriate.  ____________________________________________   LABS (all labs ordered are listed, but only abnormal results are displayed)  Labs Reviewed - No data to display ____________________________________________  RADIOLOGY  Lumbar image is negative for acute findings per radiology. ____________________________________________   PROCEDURES  Procedures  ____________________________________________   INITIAL IMPRESSION / ASSESSMENT AND PLAN / ED COURSE  Pamela Collier is a 78 y.o. who presents to the emergency department for treatment and evaluation after mechanical, non-syncopal fall in her home yesterday where she landed on her back.  X-ray is reassuring.  While here she was given tramadol and then was able to better ambulate with a walker.  Patient was advised to follow-up with her primary care provider or the orthopedist if not improving over the next several days.  She was advised to return to the emergency room for any other symptom of concern.   Medications  traMADol (ULTRAM) tablet 50 mg (50 mg Oral Given 06/30/18 1303)    Pertinent labs & imaging results that were available during my care of the patient were reviewed by me and considered in my medical decision making (see chart for details).  _________________________________________   FINAL CLINICAL IMPRESSION(S) / ED DIAGNOSES  Final diagnoses:  Acute bilateral low back pain without sciatica  ED Discharge Orders         Ordered    traMADol (ULTRAM) 50 MG tablet  2 times daily  PRN     06/30/18 1327           If controlled substance prescribed during this visit, 12 month history viewed on the [place] prior to issuing an initial prescription for Schedule II or III opiod.   Victorino Dike, FNP 06/30/18 Johnney Ou, MD 07/08/18 1251

## 2018-07-01 ENCOUNTER — Ambulatory Visit: Payer: Medicare Other | Admitting: Internal Medicine

## 2018-07-02 ENCOUNTER — Ambulatory Visit: Payer: Medicare Other | Admitting: Nurse Practitioner

## 2018-07-02 ENCOUNTER — Other Ambulatory Visit: Payer: Self-pay | Admitting: *Deleted

## 2018-07-02 DIAGNOSIS — N184 Chronic kidney disease, stage 4 (severe): Secondary | ICD-10-CM | POA: Diagnosis not present

## 2018-07-02 DIAGNOSIS — J9621 Acute and chronic respiratory failure with hypoxia: Secondary | ICD-10-CM | POA: Diagnosis not present

## 2018-07-02 DIAGNOSIS — J441 Chronic obstructive pulmonary disease with (acute) exacerbation: Secondary | ICD-10-CM | POA: Diagnosis not present

## 2018-07-02 DIAGNOSIS — E1122 Type 2 diabetes mellitus with diabetic chronic kidney disease: Secondary | ICD-10-CM | POA: Diagnosis not present

## 2018-07-02 DIAGNOSIS — I130 Hypertensive heart and chronic kidney disease with heart failure and stage 1 through stage 4 chronic kidney disease, or unspecified chronic kidney disease: Secondary | ICD-10-CM | POA: Diagnosis not present

## 2018-07-02 DIAGNOSIS — I5032 Chronic diastolic (congestive) heart failure: Secondary | ICD-10-CM | POA: Diagnosis not present

## 2018-07-02 NOTE — Patient Outreach (Signed)
[facility]) Care Management  07/02/2018  SHEILLA MARIS 09-Jun-1940 [id number]  Telephone follow up call   Patient admission to [facility] 11/8-11/11 Dx: Near Syncope , fall  Readmission at [facility] 12/15-12/20, Dx: Acute on Chronic Diastolic heart failure . [facility] 12/20-1/13.  Patient with [facility] readmission 1/25-1/28, Dx : Acute pulmonary edema, pleural effusion Acute on chronic respiratory failure with hypoxia. Discharge to [facility] for rehab.  Noted in Epic patient has been discharged to home from SNF.on 2/12 Noted ED visit on 2/19 complaint of low back pain after fall at home .   Outreach call to patient to follow up on ED visit related to having a fall in home. Patient discussed that she was using the walker at the time but she had something in her hand also and lost her balance.   Patient reports that she is taking medication(tramadol) prescribed for back pain and getting some relief. Patient unable to describe pain level at this time. Patient reports having an appointment with orthopedic doctor is pain does not improve. Patient discussed that they go mixed up about appointment with cardiology on today and will have to rescheduled Discussed importance of visit and offered to reschedule visit patient states she will do it , she also reports that she has so many appointments and doctors it is hard to keep up with sometimes.  Patient states she is just focused on going to [place] office on Monday, emphasized with patient taking per medication packet and that appointment is with pharmacist to review medications.   Patient reports that she did weigh on today and weight was 182,she reports some decrease in swelling of lower legs.  Patient reports blood sugar has been doing good ,  this morning reading was 98 and reports tolerating meals fairly well.    Plan Will plan return call in the next week as ongoing transition of care . Reinforced worsening symptoms of  Heart failure , increased shortness of breath, weight gain of 2 pounds in a day and 5 in a week.  Reviewed symptoms of low blood sugar, how to treat and notifying MD of reoccurrences.   [name], RN, [place] Management Coordinator  [phone]- Mobile [phone]- Toll Free Main Office

## 2018-07-05 ENCOUNTER — Ambulatory Visit: Payer: Medicare Other | Admitting: Pharmacist

## 2018-07-05 ENCOUNTER — Ambulatory Visit (INDEPENDENT_AMBULATORY_CARE_PROVIDER_SITE_OTHER): Payer: Medicare Other | Admitting: Pharmacist

## 2018-07-05 ENCOUNTER — Encounter: Payer: Self-pay | Admitting: Pharmacist

## 2018-07-05 ENCOUNTER — Other Ambulatory Visit: Payer: Self-pay | Admitting: Family

## 2018-07-05 VITALS — BP 138/62 | HR 66 | Wt 175.4 lb

## 2018-07-05 DIAGNOSIS — E1122 Type 2 diabetes mellitus with diabetic chronic kidney disease: Secondary | ICD-10-CM

## 2018-07-05 DIAGNOSIS — I129 Hypertensive chronic kidney disease with stage 1 through stage 4 chronic kidney disease, or unspecified chronic kidney disease: Secondary | ICD-10-CM

## 2018-07-05 DIAGNOSIS — N184 Chronic kidney disease, stage 4 (severe): Secondary | ICD-10-CM | POA: Diagnosis not present

## 2018-07-05 DIAGNOSIS — I130 Hypertensive heart and chronic kidney disease with heart failure and stage 1 through stage 4 chronic kidney disease, or unspecified chronic kidney disease: Secondary | ICD-10-CM | POA: Diagnosis not present

## 2018-07-05 DIAGNOSIS — I5032 Chronic diastolic (congestive) heart failure: Secondary | ICD-10-CM | POA: Diagnosis not present

## 2018-07-05 DIAGNOSIS — J441 Chronic obstructive pulmonary disease with (acute) exacerbation: Secondary | ICD-10-CM | POA: Diagnosis not present

## 2018-07-05 DIAGNOSIS — E1142 Type 2 diabetes mellitus with diabetic polyneuropathy: Secondary | ICD-10-CM

## 2018-07-05 DIAGNOSIS — J9621 Acute and chronic respiratory failure with hypoxia: Secondary | ICD-10-CM | POA: Diagnosis not present

## 2018-07-05 DIAGNOSIS — I10 Essential (primary) hypertension: Secondary | ICD-10-CM

## 2018-07-05 DIAGNOSIS — N183 Chronic kidney disease, stage 3 (moderate): Secondary | ICD-10-CM

## 2018-07-05 MED ORDER — INSULIN ASPART PROT & ASPART (70-30 MIX) 100 UNIT/ML ~~LOC~~ SUSP: SUBCUTANEOUS | 11 refills | Status: DC

## 2018-07-05 MED ORDER — PROPRANOLOL HCL 10 MG PO TABS: 10.0000 mg | ORAL_TABLET | Freq: Three times a day (TID) | ORAL | 0 refills | Status: DC

## 2018-07-05 MED ORDER — HYDRALAZINE HCL 25 MG PO TABS: 25.0000 mg | ORAL_TABLET | Freq: Three times a day (TID) | ORAL | 3 refills | Status: AC

## 2018-07-05 NOTE — Patient Instructions (Addendum)
It was great to meet you today!  I spoke with Pamela Collier the pharmacist at [facility], and we have gotten your medication packs corrected for your recent medication changes. Once you have picked up these updated pill packs from the pharmacy, please discard the old pill packs. Your medication list in this packet is updated.  Please take insulin 30 units with breakfast and decrease to 40 units with supper. Please bring your blood sugar readings to Pamela Collier at your next appointment   Feel free to call with any questions or concerns. If any medication changes happen when you see Pamela Collier, we can work on communicating these to the pharmacy to update your pill packs.   Pamela Collier, [place] Phone: [phone]

## 2018-07-05 NOTE — Progress Notes (Signed)
S:     Chief Complaint  Patient presents with  . Medication Management    Patient arrives in good spirits with her husband, [name], ambulating with a walker.Joen Laura for medication management as a referral from [place] to Ralene Bathe, PharmD. Care transferred to me due to being embedded in the PCP office.   They report significant confusion with medication maintenance since her discharge to rehab and subsequent discharge home. They bring in bubble packs from [facility] that are dated from November 2019, so there are multiple discrepancies. Her husband also notes that she doesn't always take Monday pills on Monday, Tuesday pills on Tuesday, etc, and asks if this is important to do. Of note, her furosemide prescription is written to take Monday, Wed, and Friday only.   They note that they still plan to move up to [place] with family later this year, once the weather is warmer.   Noted the following things:  - Hydralazine was removed from her list - Donepezil is no longer on her list, but is in pill packs  - At hospital discharge, propranolol was restarted. Her pill packs dosed it once daily, but the discharge prescription on her current list is three times daily - Her pill packs do NOT have prednisone in them, so she has not been taking this  - Uncertain how many days/week and which days the patient has been taking furosemide, as she doesn't follow the days on the pill packs  She notes that her breathing has been OK since discharge, and she has used Duonebs just a few days as well as Dulera daily.   She reports fasting blood sugars 80-90s, though did not bring her meter for review today. She reports taking insulin 70/30 30 units QAM and 45 or 50 units QPM, 50 if her blood sugars are higher. She is unable to vocalize a specific rule for what blood sugar level constitutes "higher". She notes that a few mornings, she has been so hypoglycemic  that she needs to drink orange juice, "just like at rehab". Her husband endorses that she seems more tired/groggy during these episodes. She confirms that she is taking her insulin appropriately, right before breakfast and before supper.   She reports that her home scale read 182 lbs this morning (so ~ 7 lbs lighter than the clinic scale), and that this has been relatively stable since discharge.   Lab Results  Component Value Date   HGBA1C 7.7 (H) 02/10/2018   Vitals:   07/05/18 1210  BP: 138/62  Pulse: 66    Basic Metabolic Panel BMP Latest Ref Rng & Units 06/08/2018 06/07/2018 06/06/2018  Glucose 70 - 99 mg/dL 131(H) 184(H) 136(H)  BUN 8 - 23 mg/dL 13 14 13   Creatinine 0.44 - 1.00 mg/dL 1.46(H) 1.50(H) 1.50(H)  BUN/Creat Ratio 12 - 28 - - -  Sodium 135 - 145 mmol/L 140 139 140  Potassium 3.5 - 5.1 mmol/L 3.2(L) 3.5 3.8  Chloride 98 - 111 mmol/L 103 106 107  CO2 22 - 32 mmol/L 29 27 27   Calcium 8.9 - 10.3 mg/dL 8.5(L) 8.2(L) 8.3(L)    Lipid Panel     Component Value Date/Time   CHOL 124 11/15/2016 0540   CHOL 134 03/19/2012 0024   TRIG 175 (H) 11/15/2016 0540   TRIG 110 03/19/2012 0024   HDL 29 (L) 11/15/2016 0540   HDL 41 03/19/2012 0024   CHOLHDL 4.3 11/15/2016 0540  VLDL 35 11/15/2016 0540   VLDL 22 03/19/2012 0024   LDLCALC 60 11/15/2016 0540   LDLCALC 71 03/19/2012 0024   LDLDIRECT 102.0 03/14/2016 1452    Clinical ASCVD: Yes    A/P: #Medication Management -  - [place], FNP; discussed that hydralazine was no longer on her medication list. Ms. Jackelyn Hoehn agreed to continue hydralazine (patient has been taking at home), and added back to her medication list. Verified other cardiac medications with Ms. Hackney.  - Contacted Drew, RPh in charge of bubble packs at [facility]. Reviewed patient's current medication list; determined that he needed a new prescription for propranolol TID. Spoke with Dr. Derrel Nip and sent this in on her  behalf.  - Donepezil stopped 04/2018 by Dr. Derrel Nip, in case it was contributing to diarrhea. Patient has continued to take, as it was in her pill packs. Unsure why it was re-added to her medication list in January, but then was stopped in February. Will avoid restarting. Communicated this to Dian Situ, [place].  - Discussed blood sugar results with Dr. Derrel Nip; decided to decrease evening insulin 70/30 to 40 units daily to prevent morning hypoglycemia. Provided patient with a chart to track fasting and pre-supper blood sugar readings over the next week to provide to Dr. Derrel Nip at follow up.  - Updated patient's Epic medication list to reflect current prescriptions.   Encouraged patient and her husband to pick up the updated pill packs from the pharmacy and DISCARD of old pill packs to prevent confusion. They agreed. Encouraged to take medications on the correct days so that she receives the appropriate dose of furosemide. Provided with my contact information with future questions or concerns. I will plan to follow up with them after their appointment with Dr. Derrel Nip next week to reinforce any changes.   De Hollingshead, PharmD, [place] PGY2 Ambulatory Care Pharmacy Resident Phone: [phone]

## 2018-07-06 ENCOUNTER — Ambulatory Visit: Payer: Medicare Other | Admitting: Family

## 2018-07-07 ENCOUNTER — Other Ambulatory Visit: Payer: Self-pay | Admitting: *Deleted

## 2018-07-07 NOTE — Patient Outreach (Signed)
[facility]) Care Management  07/07/2018  ALDINA Collier 1940/05/23 [id number]   Transition of care call  Patient admission to [facility] 11/8-11/11 Dx: Near Syncope , fall  Readmission at [facility] 12/15-12/20, Dx: Acute on Chronic Diastolic heart failure .[facility] 12/20-1/13.  Patient with Pamela Collier readmission 1/25-1/28, Dx : Acute pulmonary edema, pleural effusion Acute on chronic respiratory failure with hypoxia. Discharge to [name] for rehab.  Noted in Epic patient has been discharged to home from SNF.on 2/12 Noted ED visit on 2/19 complaint of low back pain after fall at home .  Successful outreach call to patient , she reports feeling okay on today.   She further discussed :  Diabetes Patient reports that her blood sugar was 79 on this morning she denies having low blood sugar symptoms that were reviewed. She report drinking orange juice and then having her breakfast. Discussed with patient amount of insulin that she was taking ,states that she took 30 units this morning unable to remember how much she took on last night. Discussed recent office visit with [name] pharmacist for review of her medication and changes to insulin dosage. Patient discussed having problems with memory and is unsure what changes are to insulin, she is unable to locate her AVS for review. I reviewed recommendations from DC visit note for 70/30 insulin 30 units at breakfast and 40 at supper, patient states that she has written it down now.  Heart failure Patient reports that her weight at home today was, 170 , questioned patient again, she states that her book is in the other room and she is resting in her recliner now. Reviewed the yellow zone symptoms of Heart failure , patient reports decrease swelling in lower legs and she is wearing her compression hose, she denies increase in shortness of breath .  Discussed community paramedic program for heart failure follow up for continued  support  and  patient declines stating that she already has home health PT and nurse coming in for now, states maybe later when not so many people coming and calling.  Medications Patient reports that she is taking her medication from medication package that she has at home.  Patient still discussed concern about wanting to use of current packages before getting new one. Reminded of recent visit to pharmacist regarding medication review, discussed pharmacist has sent updated medication list to [place] for new pill packaging , patient states that she has not picked them up yet, and she will let here husband know when he gets home. Emphasized the importance of taking medications as prescribed in new packages. .  Placed call to [name] spoke with Pharmacist Dian Situ that verified that he has new packages available, placed return call to patient and she states she will let her husband know to pick up. Explained to patient that she can take old packages to pharmacy .  Appointments Patient has post discharge visit with Dr.Tullo on 3/2, patient states she is looking forward to visit.    Plan  Placed care coordination call to [facility] to collaborate regarding home health RN regarding follow up on patient medication/insulin .  Will continue weekly telephone outreach as ongoing transition of care .    THN CM Care Plan Problem One     Most Recent Value  Care Plan Problem One  At risk for readmission related to recent discharge from rehab after hospital admission for Heart failure   Role Documenting the Problem One  Care Management Coordinator  Care Plan for Problem One  Active  THN Long Term Goal   Patient will not experience a hospital admission over the next 60 days   THN Long Term Goal Start Date  06/25/18  Interventions for Problem One Long Term Goal  Discussed current clinical state , reviewed importance taking medication as prescribed.   THN CM Short Term Goal #1   Over the  next 30 days patient will be able to report weighing self daily and keeping a record  [facility]N CM Short Term Goal #1 Start Date  06/25/18  Interventions for Short Term Goal #1  Discussed daily weights and stresssed the importance of keeping a record of daily weiights to identify sudden changes sooner, reviewed best time of day to weigh   [facility] CM Short Term Goal #2   Patient will be able to report attending all medical appointments over the next 20  [facility] CM Short Term Goal #2 Start Date  06/25/18  Interventions for Short Term Goal #2  REviewed upcoming visit with Dr.Tullo and importance of attending. Encouraged to take medication package .    THN CM Short Term Goal #3  Patient will be able to verbalize at least 2 symptoms of worsening Heart Failure over the next 30 days   THN CM Short Term Goal #3 Start Date  06/28/18  Interventions for Short Tern Goal #3  Reinforced yellow zone symptoms of heart failure and action plan on notifying MD, reviewed Heart faiilure clinic contact     [place] Problem Two     Most Recent Value  Care Plan Problem Two  Diabetes self care management knowledge deficit   Role Documenting the Problem Two  Care Management Coordinator  Care Plan for Problem Two  Active  Interventions for Problem Two Long Term Goal   Reinforced taking medications as prescribed, reviewed recent dosage changes   THN Long Term Goal  Patient  will be able to verbalize at least 2 measures to managing diabetes control over the next 60 days  THN Long Term Goal Start Date  06/28/18  [facility] CM Short Term Goal #1   Patient will be able to report monitoring blood sugars at least 2 twice daily over the next 30 days and keeping a record.   THN CM Short Term Goal #1 Start Date  06/28/18  Interventions for Short Term Goal #2   Discussed recent blood sugar readings and reinforced continuing to monitor and keep a record and taking to PCP visi t  THN CM Short Term Goal #2   Patient will be able to report 2 ways to  manage low blood sugar over the next 14 days   THN CM Short Term Goal #2 Start Date  07/07/18  Interventions for Short Term Goal #2  Advised patient on normal blood sugar range , reviewed low blood sugar symptom and treatment plan of rule of 15.       [name], RN, [place] Management Coordinator  [phone]- Mobile [phone]- Toll Free Main Office

## 2018-07-08 ENCOUNTER — Telehealth: Payer: Self-pay | Admitting: Internal Medicine

## 2018-07-08 DIAGNOSIS — J9621 Acute and chronic respiratory failure with hypoxia: Secondary | ICD-10-CM | POA: Diagnosis not present

## 2018-07-08 DIAGNOSIS — I130 Hypertensive heart and chronic kidney disease with heart failure and stage 1 through stage 4 chronic kidney disease, or unspecified chronic kidney disease: Secondary | ICD-10-CM | POA: Diagnosis not present

## 2018-07-08 DIAGNOSIS — N184 Chronic kidney disease, stage 4 (severe): Secondary | ICD-10-CM | POA: Diagnosis not present

## 2018-07-08 DIAGNOSIS — I5032 Chronic diastolic (congestive) heart failure: Secondary | ICD-10-CM | POA: Diagnosis not present

## 2018-07-08 DIAGNOSIS — E1122 Type 2 diabetes mellitus with diabetic chronic kidney disease: Secondary | ICD-10-CM | POA: Diagnosis not present

## 2018-07-08 DIAGNOSIS — J441 Chronic obstructive pulmonary disease with (acute) exacerbation: Secondary | ICD-10-CM | POA: Diagnosis not present

## 2018-07-08 NOTE — Telephone Encounter (Signed)
Copied from [place] [id number]. Topic: Quick Communication - Home Health Verbal Orders >> Jul 08, 2018  4:19 PM Percell Belt A wrote: Caller/Agency: [facility]  Callback Number: [phone] She called to just give fyi, Ems was called for pt today a couple of hours ago. Pt Sugar was low .  They gave her glucose and orange juice and it was back up to 90.  Husband was going to feed pt and monitor her tonight.

## 2018-07-08 NOTE — Telephone Encounter (Signed)
Patient taking 40 units  70/30 at night and 30 units in the am per patient, patient CBG at this time is 226 after eating , patient husband says  she is not eating before taking morning insulin or after taking except half cup of oatmeal, previous message PEC tech took, from nurse for [facility]. Your message said if taking 30 units at night cut back to 20, patient taking 40 units advised patient husband to cut back to 30 units. Incident happen at 3 PM.

## 2018-07-08 NOTE — Telephone Encounter (Signed)
Called and spoke with patient PCP advised patient status below was given verbal to advise patient to take 20 units 70/30 at night and 10 units in the morning, until appetite improves. FYI

## 2018-07-08 NOTE — Telephone Encounter (Signed)
I AM DISAPPOINTED BY THIS MESSAGE. PLEASE PASS ON TO Whoever took this call that they should have asked how much insulin she took this morning,  And what time the insulin occurred.  We ARE a doctor's office!!!,  If she took 30 units as per charted diretions from [facility], then She needs to reduce her morning 70/30 insulin dose to 20 units .  If the incident happened BEFORE she took her morning insulin,  I need to know that,  As well as how many units she took last evening.

## 2018-07-08 NOTE — Telephone Encounter (Signed)
Spoke to you about this pt.  You called Dr Derrel Nip and she gave you new orders.

## 2018-07-09 ENCOUNTER — Other Ambulatory Visit: Payer: Self-pay | Admitting: *Deleted

## 2018-07-09 DIAGNOSIS — J441 Chronic obstructive pulmonary disease with (acute) exacerbation: Secondary | ICD-10-CM | POA: Diagnosis not present

## 2018-07-09 DIAGNOSIS — J9621 Acute and chronic respiratory failure with hypoxia: Secondary | ICD-10-CM | POA: Diagnosis not present

## 2018-07-09 DIAGNOSIS — I130 Hypertensive heart and chronic kidney disease with heart failure and stage 1 through stage 4 chronic kidney disease, or unspecified chronic kidney disease: Secondary | ICD-10-CM | POA: Diagnosis not present

## 2018-07-09 DIAGNOSIS — E1122 Type 2 diabetes mellitus with diabetic chronic kidney disease: Secondary | ICD-10-CM | POA: Diagnosis not present

## 2018-07-09 DIAGNOSIS — I5032 Chronic diastolic (congestive) heart failure: Secondary | ICD-10-CM | POA: Diagnosis not present

## 2018-07-09 DIAGNOSIS — N184 Chronic kidney disease, stage 4 (severe): Secondary | ICD-10-CM | POA: Diagnosis not present

## 2018-07-09 NOTE — Patient Outreach (Signed)
[facility]) Care Management  07/09/2018  Pamela Collier [birth year] [id number]   Care coordination call  [facility] to [facility] to collaborate with home health RN, transferred to scheduler voice mail able to leave a message for return call.   Plan Will plan follow up call within the next 4 business days, if no return call.   [name], RN, [place] Management Coordinator  [phone]- Mobile [phone]- Toll Free Main Office

## 2018-07-12 ENCOUNTER — Ambulatory Visit (INDEPENDENT_AMBULATORY_CARE_PROVIDER_SITE_OTHER): Payer: Medicare Other | Admitting: Internal Medicine

## 2018-07-12 ENCOUNTER — Encounter: Payer: Self-pay | Admitting: Internal Medicine

## 2018-07-12 DIAGNOSIS — I129 Hypertensive chronic kidney disease with stage 1 through stage 4 chronic kidney disease, or unspecified chronic kidney disease: Secondary | ICD-10-CM

## 2018-07-12 DIAGNOSIS — E1122 Type 2 diabetes mellitus with diabetic chronic kidney disease: Secondary | ICD-10-CM | POA: Diagnosis not present

## 2018-07-12 DIAGNOSIS — N183 Chronic kidney disease, stage 3 (moderate): Secondary | ICD-10-CM

## 2018-07-12 DIAGNOSIS — I6523 Occlusion and stenosis of bilateral carotid arteries: Secondary | ICD-10-CM

## 2018-07-12 DIAGNOSIS — F0150 Vascular dementia without behavioral disturbance: Secondary | ICD-10-CM

## 2018-07-12 NOTE — Progress Notes (Signed)
Subjective:  Patient ID: Pamela Collier, female    DOB: Nov 25, 1940  Age: 78 y.o. MRN: [id number]  CC: Diagnoses of Type 2 DM with CKD stage 3 and hypertension ([place]) and Vascular dementia without behavioral disturbance ([place]) were pertinent to this visit.  HPI Pamela Collier presents for  > 14 day hospital follow up .  She is accompanied by [name] her husband.  They continue to report confusion with medication regimen, despite the reconciliation done last week by PharmD Darnelle Maffucci and the issuing of new pill packs from her pharmacy.  She has not taken the afternoon doses of her scheduled meds for the last 2 days and is still taking the wrong dose of insulin .   Recap of events.    She was  HOSPITALIZED for the third time in 3 months  at [facility]. Admitted on  January 25 for acute pulmonary edema, was treated with IV lasix, therapeutic thoracentesis of right sided pleural effusion (750 cc's removed) , supplemental  oxygen  and discharged to [facility] ) on January 28  , where she stayed until Feb 6  Had follow up with CHF clinic on Feb 3 and home CHF protocol was reinstituted with advice to weight daily and report any changes in wt of 2 lbs overnight or 5 lbs in a week .  She is receiving home PT/OT via [facility]   Feb 19: Evaluated in ED on Feb 19 for mechanical fall and low back pain . No fractures per lumbar spine films   Feb 24:  Patient and husband met with pharmD for medication reconciliation/management following rehab discharge due to reports of confusion with meds.  Insulin doses were reduced  to 30 units of mixed insulin in the am and 40 units in the PM     Feb 27 : [place] called office to report an episode symptomatic hypoglycemia i requiring EMS action.  The cause was reported as due to anorexia and decreased oral intake.  Husband was instructed to reduce her  Insulin dose  to 20 units  In the evning and 10 units qam (see phone note Feb 27), but patient and husband do not  remember the call and she has been taking 30 units in the am and 45 units in the evening !  Does not recall having any more low blood sugars.  Ate oatmeal this morning sweetened with equal and butter and took 30 units of mixed insulin at same time around 9:00.   cbg at 11:30  (at time of eval) in office is 87   Now taking donepezil with tolerance and no increased diarrhea.  Prescribed by neurology NP (patient and husband do not recall when this was prescribed)    Outpatient Medications Prior to Visit  Medication Sig Dispense Refill  . acetaminophen (TYLENOL) 500 MG tablet Take 1,000 mg by mouth every 6 (six) hours as needed for mild pain or headache.     . allopurinol (ZYLOPRIM) 100 MG tablet Take 1 tablet (100 mg total) by mouth daily. 30 tablet 0  . ALPRAZolam (XANAX) 0.25 MG tablet Take 1 tablet (0.25 mg total) by mouth daily. 20 tablet 0  . amLODipine (NORVASC) 5 MG tablet Take 2 tablets (10 mg total) by mouth daily. 90 tablet 1  . aspirin EC 81 MG tablet Take 81 mg by mouth daily.    . B-D UF III MINI PEN NEEDLES 31G X 5 MM MISC USE THREE TIMES A DAY  270 each 4  . blood glucose meter kit and supplies Dispense based on patient and insurance preference. Use up to three times daily as directed. (FOR ICD-10 E11.21) 1 each 0  . dicyclomine (BENTYL) 10 MG capsule Take 1 capsule (10 mg total) by mouth 4 (four) times daily -  before meals and at bedtime. 120 capsule 0  . donepezil (ARICEPT) 10 MG tablet Take 10 mg by mouth at bedtime.    . furosemide (LASIX) 20 MG tablet Take 1 tablet (20 mg total) by mouth daily. Take 1 tablet (20 mg ) once daily on Monday, Wednesday and Friday. 90 tablet 3  . glucose blood (ACCU-CHEK AVIVA PLUS) test strip USE TO CHECK GLUCOSE THREE TIMES DAILY 200 each 5  . glucose blood test strip Use to check blood sugars 2 times daily 100 each 12  . hydrALAZINE (APRESOLINE) 25 MG tablet Take 1 tablet (25 mg total) by mouth 3 (three) times daily. 270 tablet 3  .  ipratropium-albuterol (DUONEB) 0.5-2.5 (3) MG/3ML SOLN Take 3 mLs by nebulization every 6 (six) hours as needed (wheezing, shortness of breath). 360 mL 0  . isosorbide mononitrate (IMDUR) 30 MG 24 hr tablet Take 1 tablet (30 mg total) by mouth daily. 90 tablet 2  . mometasone-formoterol (DULERA) 200-5 MCG/ACT AERO Inhale 2 puffs into the lungs 2 (two) times daily. 13 g 3  . nitroGLYCERIN (NITROLINGUAL) 0.4 MG/SPRAY spray Place 1 spray under the tongue every 5 (five) minutes x 3 doses as needed for chest pain. 12 g 12  . pregabalin (LYRICA) 100 MG capsule Take 100 mg by mouth 2 (two) times daily.     . propranolol (INDERAL) 10 MG tablet Take 1 tablet (10 mg total) by mouth 3 (three) times daily. 30 tablet 0  . rosuvastatin (CRESTOR) 20 MG tablet Take 1 tablet (20 mg total) by mouth daily. 90 tablet 2  . ticagrelor (BRILINTA) 60 MG TABS tablet Take 1 tablet (60 mg total) by mouth 2 (two) times daily. 60 tablet 11  . HUMALOG MIX 75/25 KWIKPEN (75-25) 100 UNIT/ML Kwikpen     . diphenoxylate-atropine (LOMOTIL) 2.5-0.025 MG tablet Take 1 tablet by mouth 4 (four) times daily as needed for diarrhea or loose stools. (Patient not taking: Reported on 07/12/2018) 30 tablet 0  . fluticasone (FLONASE) 50 MCG/ACT nasal spray Place 2 sprays into both nostrils daily as needed for rhinitis. (Patient not taking: Reported on 07/12/2018) 16 g 5  . guaiFENesin (MUCINEX) 600 MG 12 hr tablet Take 600 mg by mouth 2 (two) times daily.    [name] guaiFENesin-dextromethorphan (ROBITUSSIN DM) 100-10 MG/5ML syrup Take 5 mLs by mouth every 4 (four) hours as needed for cough. (Patient not taking: Reported on 06/28/2018) 118 mL 0  . ondansetron (ZOFRAN) 4 MG tablet Take 4 mg by mouth every 8 (eight) hours as needed for nausea or vomiting.    . senna (SENOKOT) 8.6 MG tablet Take 1 tablet by mouth 2 (two) times daily as needed for constipation.    . traMADol (ULTRAM) 50 MG tablet Take 1 tablet (50 mg total) by mouth 2 (two) times daily as needed.  (Patient not taking: Reported on 07/12/2018) 12 tablet 0  . insulin aspart protamine- aspart (NOVOLOG MIX 70/30) (70-30) 100 UNIT/ML injection Take 30 units with breakfast and 40 units with supper (Patient not taking: Reported on 07/05/2018) 10 mL 11  . predniSONE (DELTASONE) 10 MG tablet Take 10 mg by mouth daily with breakfast.     Facility-Administered Medications  Prior to Visit  Medication Dose Route Frequency Provider Last Rate Last Dose  . albuterol (PROVENTIL) (5 MG/ML) 0.5% nebulizer solution 2.5 mg  2.5 mg Nebulization Once [name], MD        Review of Systems;  Patient denies headache, fevers, malaise, unintentional weight loss, skin rash, eye pain, sinus congestion and sinus pain, sore throat, dysphagia,  hemoptysis , cough, dyspnea, wheezing, chest pain, palpitations, orthopnea, edema, abdominal pain, nausea, melena, diarrhea, constipation, flank pain, dysuria, hematuria, urinary  Frequency, nocturia, numbness, tingling, seizures,  =, Loss of consciousness,  Tremor, insomnia, depression, anxiety, and suicidal ideation.      Objective:  BP 134/64 (BP Location: Left Arm, Patient Position: Sitting, Cuff Size: Normal)   Pulse (!) 59   Temp 98.1 F (36.7 C) (Oral)   Resp 16   Ht 5' 7"  (1.702 m)   Wt 175 lb (79.4 kg)   SpO2 98%   BMI 27.41 kg/m   BP Readings from Last 3 Encounters:  07/12/18 134/64  07/05/18 138/62  06/30/18 135/65    Wt Readings from Last 3 Encounters:  07/12/18 175 lb (79.4 kg)  07/05/18 175 lb 6.4 oz (79.6 kg)  06/30/18 182 lb (82.6 kg)    General appearance: alert, cooperative and appears stated age Ears: normal TM's and external ear canals both ears Throat: lips, mucosa, and tongue normal; teeth and gums normal Neck: no adenopathy, no carotid bruit, supple, symmetrical, trachea midline and thyroid not enlarged, symmetric, no tenderness/mass/nodules Back: symmetric, no curvature. ROM normal. No CVA tenderness. Lungs: clear to auscultation  bilaterally Heart: regular rate and rhythm, S1, S2 normal, no murmur, click, rub or gallop Abdomen: soft, non-tender; bowel sounds normal; no masses,  no organomegaly Pulses: 2+ and symmetric Skin: Skin color, texture, turgor normal. No rashes or lesions Lymph nodes: Cervical, supraclavicular, and axillary nodes normal Neuro:  awake and interactive with normal mood and affect. Higher cortical functions are normal, but memory for recent events is impaired. Speech is clear without word-finding difficulty or dysarthria. Extraocular movements are intact. Visual fields of both eyes are grossly intact. Sensation to light touch is grossly intact bilaterally of upper and lower extremities. Motor examination shows 4+/5 symmetric hand grip and upper extremity and 5/5 lower extremity strength. There is no pronation or drift.  Essential tremor is present. Gait is non-ataxic  .  Lab Results  Component Value Date   HGBA1C 7.7 (H) 02/10/2018   HGBA1C 7.7 (H) 09/25/2017   HGBA1C 7.5 (H) 06/08/2017    Lab Results  Component Value Date   CREATININE 1.46 (H) 06/08/2018   CREATININE 1.50 (H) 06/07/2018   CREATININE 1.50 (H) 06/06/2018    Lab Results  Component Value Date   WBC 7.3 06/07/2018   HGB 9.7 (L) 06/07/2018   HCT 31.1 (L) 06/07/2018   PLT 408 (H) 06/07/2018   GLUCOSE 131 (H) 06/08/2018   CHOL 124 11/15/2016   TRIG 175 (H) 11/15/2016   HDL 29 (L) 11/15/2016   LDLDIRECT 102.0 03/14/2016   LDLCALC 60 11/15/2016   ALT 15 04/25/2018   AST 22 04/25/2018   NA 140 06/08/2018   K 3.2 (L) 06/08/2018   CL 103 06/08/2018   CREATININE 1.46 (H) 06/08/2018   BUN 13 06/08/2018   CO2 29 06/08/2018   TSH 0.758 04/25/2018   INR 1.2 (H) 02/02/2015   HGBA1C 7.7 (H) 02/10/2018   MICROALBUR 28.9 (H) 09/26/2015    Dg Lumbar Spine Complete  Result Date: 06/30/2018 CLINICAL DATA:  Low back pain, fall yesterday EXAM: LUMBAR SPINE - COMPLETE 4+ VIEW COMPARISON:  CT scan 03/19/2018 FINDINGS: Preserved  intervertebral disc spaces. Lower thoracic and lumbar spondylosis noted without appreciable fracture or subluxation. Aortoiliac atherosclerotic vascular disease. Degenerative facet arthropathy bilaterally at L4-5 and L5-S1. IMPRESSION: 1. Lower thoracic and lumbar spondylosis noted. No appreciable fracture or acute subluxation 2.  Aortic Atherosclerosis (ICD10-I70.0). Electronically Signed   By: Van Clines M.D.   On: 06/30/2018 12:26    Assessment & Plan:   Problem List Items Addressed This Visit    Vascular dementia without behavioral disturbance ([place])    Now taking generic aricept without adverse effects.  She is unable to manage her own medications and is reliant on the help of her husband for the hourly supervision .  She will be returning to [place] in the near future where her daughter will be actively involved in her medication administration       Relevant Medications   donepezil (ARICEPT) 10 MG tablet   Type 2 DM with CKD stage 3 and hypertension ([place])    With recent hypoglycemic  Episode secondary to medication error and anorexia. tTodays' 2 hr post breakfast CBG was 87 after taking 30 units.   Dose of mixed insulin was again  reduced AND CLEARLY COMMUNICATED TO PATIENT AND HUSBAND :  30 units in the evening and 20 units in the morning , and to suspend insulin if she does not eat either meal.   Lab Results  Component Value Date   HGBA1C 7.7 (H) 02/10/2018         Relevant Medications   HUMALOG MIX 75/25 KWIKPEN (75-25) 100 UNIT/ML Kwikpen      I have discontinued Huda L. Allemand's predniSONE and insulin aspart protamine- aspart. I have also changed her [place]. Additionally, I am having her maintain her acetaminophen, nitroGLYCERIN, fluticasone, mometasone-formoterol, B-D UF III MINI PEN NEEDLES, glucose blood, glucose blood, isosorbide mononitrate, dicyclomine, aspirin EC, allopurinol, rosuvastatin, ticagrelor, amLODipine,  guaiFENesin-dextromethorphan, ondansetron, pregabalin, senna, guaiFENesin, blood glucose meter kit and supplies, furosemide, ipratropium-albuterol, ALPRAZolam, diphenoxylate-atropine, traMADol, hydrALAZINE, propranolol, and donepezil.  Meds ordered this encounter  Medications  . HUMALOG MIX 75/25 KWIKPEN (75-25) 100 UNIT/ML Kwikpen    Sig: 20 units with breakfast and 30 units with evening meal    Dispense:  15 mL    Refill:  11  A total of 40 minutes was spent with patient more than half of which was spent in counseling patient on the above mentioned issues , reviewing and explaining recent labs and imaging studies done, and coordination of care.   Medications Discontinued During This Encounter  Medication Reason  . predniSONE (DELTASONE) 10 MG tablet Completed Course  . HUMALOG MIX 75/25 KWIKPEN (75-25) 100 UNIT/ML Kwikpen   . insulin aspart protamine- aspart (NOVOLOG MIX 70/30) (70-30) 100 UNIT/ML injection     Follow-up: Return in about 4 weeks (around 08/09/2018).   [name], MD

## 2018-07-12 NOTE — Patient Instructions (Addendum)
You need to reduce your morning insulin to 20 units,  And you should  take it right after you eat.   You need to reduce your evening insulin  Dose to 30 units , take it right after you eat dinner   If you do not eat,  DO NOT TAKE INSULIN   THE donepezil is a memory pill that Dr Moshe Cipro prescribed for you  . It is in your evening packet

## 2018-07-13 ENCOUNTER — Telehealth: Payer: Self-pay | Admitting: Internal Medicine

## 2018-07-13 DIAGNOSIS — I130 Hypertensive heart and chronic kidney disease with heart failure and stage 1 through stage 4 chronic kidney disease, or unspecified chronic kidney disease: Secondary | ICD-10-CM | POA: Diagnosis not present

## 2018-07-13 DIAGNOSIS — I5032 Chronic diastolic (congestive) heart failure: Secondary | ICD-10-CM | POA: Diagnosis not present

## 2018-07-13 DIAGNOSIS — J9621 Acute and chronic respiratory failure with hypoxia: Secondary | ICD-10-CM | POA: Diagnosis not present

## 2018-07-13 DIAGNOSIS — J441 Chronic obstructive pulmonary disease with (acute) exacerbation: Secondary | ICD-10-CM | POA: Diagnosis not present

## 2018-07-13 DIAGNOSIS — F0150 Vascular dementia without behavioral disturbance: Secondary | ICD-10-CM | POA: Insufficient documentation

## 2018-07-13 DIAGNOSIS — N184 Chronic kidney disease, stage 4 (severe): Secondary | ICD-10-CM | POA: Diagnosis not present

## 2018-07-13 DIAGNOSIS — E1122 Type 2 diabetes mellitus with diabetic chronic kidney disease: Secondary | ICD-10-CM | POA: Diagnosis not present

## 2018-07-13 MED ORDER — HUMALOG MIX 75/25 KWIKPEN (75-25) 100 UNIT/ML ~~LOC~~ SUPN: PEN_INJECTOR | SUBCUTANEOUS | 11 refills | Status: DC

## 2018-07-13 NOTE — Assessment & Plan Note (Signed)
Now taking generic aricept without adverse effects.  She is unable to manage her own medications and is reliant on the help of her husband for the hourly supervision .  She will be returning to [place] in the near future where her daughter will be actively involved in her medication administration

## 2018-07-13 NOTE — Assessment & Plan Note (Addendum)
With recent hypoglycemic  Episode secondary to medication error and anorexia. tTodays' 2 hr post breakfast CBG was 87 after taking 30 units.   Dose of mixed insulin was again  reduced AND CLEARLY COMMUNICATED TO PATIENT AND HUSBAND :  30 units in the evening and 20 units in the morning , and to suspend insulin if she does not eat either meal.   Lab Results  Component Value Date   HGBA1C 7.7 (H) 02/10/2018

## 2018-07-13 NOTE — Telephone Encounter (Signed)
Called and spoke to pt, who is requesting name and contact number for DME company, as she has questions regarding supplies. I have provided pt with [facility]'s contact number. Advised pt to contact our office if anything further is needed on our end.  Nothing further is needed at this time.

## 2018-07-15 ENCOUNTER — Other Ambulatory Visit: Payer: Self-pay | Admitting: *Deleted

## 2018-07-15 NOTE — Patient Outreach (Signed)
[facility]) Care Management  07/15/2018  Pamela Collier 06-21-1940 [id number]   Transition of care call   Patient admission to [facility] 11/8-11/11 Dx: Near Syncope , fall  Readmission at [facility] 12/15-12/20, Dx: Acute on Chronic Diastolic heart failure .[facility] 12/20-1/13.  Patient with [facility] readmission 1/25-1/28, Dx : Acute pulmonary edema, pleural effusion Acute on chronic respiratory failure with hypoxia. Discharge to [facility] for rehab.  Noted in Epic patient has been discharged to home from SNF.on 2/12 Noted ED visit on 2/19 complaint of low back pain after fall at home .  Successful outreach call to patient she reports doing okay on today. Patient reports having a fall on Monday after her appointment with PCP. She states she stepped off the curb of side walk while leaving the eye doctor office and went down on her knees. States that she scraped up her knees and has a bandage on area now. She discussed that she was using her cane at the time. Patient report tolerating usual ambulation in her home and home health PT visited her on the next day and she believes called the doctor office.  Patient further discussed :  Diabetes Patient denies recent low blood sugar episodes , she reports that her blood sugar this morning was 130, she is unable to recall other reading and her log book is in another room. She discussed problems with her memory but she is taking he pill ordered.  Patient discussed that her appetite is a little better, she had oatmeal this morning as her usual meal, reports that she will eat lunch , then she usually drinks ensure about 2:30. Reinforced importance of nutrition and protein in diet.  Heart failure Patient reports resting in chair with her feet elevated at this time,she denies increase in shortness of breath or swelling. She reports that she also has support hose on. Patient discussed weighing daily, but she is unable to recall weight  at this time , but states that it has not gone up.  Patient states that she has contacted office to order additional CPAP supplies that should arrive.  Reinforced heart failure worsening symptoms and action plan  Medications   Patient reports have new blister  packages of medication and taking them as prescribed, She discussed that her husband wants to help her with keeping her medication straight, but she wants to do it herself. She reports that she is doing better and taking medication according to the list.  Patient is able to state recent insulin dosage change after PCP visit . High Fall Risk Stressed importance of fall precautions, using walker, cane, using handicap ramp and walkways.   Care Coordination  Placed call to [place] , spoke with nurse Malachi Paradise to collaborate regarding patient care/progression. She was able to communicate that Home health physical therapy notified patient PCP office of patient fall at home visit on 3/3, and noted scrape on knee.  Nurse also reviewed recent home visit with patient medication packet reviewed and appropriate .  Patient will have therapy home visit on 3/6.  Plan Will continue with weekly outreaches as part of transition of care .  Fall precautions reviewed, stressed using cane/walker, will send EMMI on preventing falls .  Will send Living with heart failure packet, and [facility] Diabetes  book.    [facility] CM Care Plan Problem One     Most Recent Value  Care Plan Problem One  At risk for readmission related to recent discharge  from rehab after hospital admission for Heart failure   Role Documenting the Problem One  Care Management [place] for Problem One  Active  [facility] Long Term Goal   Patient will not experience a hospital admission over the next 60 days   [facility] Long Term Goal Start Date  06/25/18  Interventions for Problem One Long Term Goal  Reviewed current clinical condition , encouraged taking medication as prescribed ,  beneifts of support in managing medications. Will send Living with heart failure packet.    [facility] CM Short Term Goal #1   Over the next 30 days patient will be able to report weighing self daily and keeping a record  [facility] CM Short Term Goal #1 Start Date  06/25/18  Interventions for Short Term Goal #1  Encouraged continuing to monitor weights, and teachback on best time of day to weigh   [facility] CM Short Term Goal #2   Patient will be able to report attending all medical appointments over the next 20  [facility] CM Short Term Goal #2 Start Date  06/25/18  [facility] CM Short Term Goal #2 Met Date  07/15/18  [facility] CM Short Term Goal #3  Patient will be able to verbalize at least 2 symptoms of worsening Heart Failure over the next 30 days   [facility] CM Short Term Goal #3 Start Date  06/28/18  Interventions for Short Tern Goal #3  REviewed worsening heart failure symptoms in yellow zone and importance of notiifying MD sooner to avoid hospital readmission .     [facility] CM Care Plan Problem Two     Most Recent Value  Care Plan Problem Two  Diabetes self care management knowledge deficit   Role Documenting the Problem Two  Care Management Coordinator  Care Plan for Problem Two  Active  Interventions for Problem Two Long Term Goal   Discussed importance of balanced meals, in controlling blood sugar reading , reviewed proteins to include in diet , Stressed importance of notifying MD of low blood sugar reading , and what is low blood sugar reading.   [facility] Long Term Goal  Patient  will be able to verbalize at least 2 measures to managing diabetes control over the next 60 days  [facility] Long Term Goal Start Date  06/28/18  [facility] CM Short Term Goal #1   Patient will be able to report monitoring blood sugars at least 2 twice daily over the next 30 days and keeping a record.   [facility] CM Short Term Goal #1 Start Date  06/28/18  Interventions for Short Term Goal #2   Discussed importance of keeping a record of blood sugar reading for next visit to PCP.    [facility] CM Short Term Goal #2   Patient will be able to report 2 ways to manage low blood sugar over the next 14 days   [facility] CM Short Term Goal #2 Start Date  07/07/18  Interventions for Short Term Goal #2  Reviewed for recent low blood sugar episodes, and how to treat       [name], RN, [place] Management Coordinator  [phone]- Mobile [phone]- [place]

## 2018-07-16 ENCOUNTER — Telehealth: Payer: Self-pay | Admitting: Internal Medicine

## 2018-07-16 ENCOUNTER — Ambulatory Visit: Payer: Medicare Other | Admitting: *Deleted

## 2018-07-16 DIAGNOSIS — S93401A Sprain of unspecified ligament of right ankle, initial encounter: Secondary | ICD-10-CM | POA: Diagnosis not present

## 2018-07-16 NOTE — Telephone Encounter (Signed)
Form has been placed in red folder.  

## 2018-07-16 NOTE — Telephone Encounter (Signed)
PT dropped off [employer] papers to be filled out by Dr. Derrel Nip. Pt will do her part after [place]. Papers are up front in color folder.

## 2018-07-18 DIAGNOSIS — I509 Heart failure, unspecified: Secondary | ICD-10-CM

## 2018-07-18 DIAGNOSIS — R55 Syncope and collapse: Secondary | ICD-10-CM

## 2018-07-18 DIAGNOSIS — R0603 Acute respiratory distress: Secondary | ICD-10-CM

## 2018-07-18 DIAGNOSIS — J441 Chronic obstructive pulmonary disease with (acute) exacerbation: Secondary | ICD-10-CM

## 2018-07-19 ENCOUNTER — Telehealth: Payer: Self-pay | Admitting: Internal Medicine

## 2018-07-19 NOTE — Telephone Encounter (Signed)
I don't see anything in the pt's last office note that mentions anything about an inhaler.

## 2018-07-19 NOTE — Telephone Encounter (Signed)
Copied from [place] [id number]. Topic: Quick Communication - See Telephone Encounter >> Jul 19, 2018 12:00 PM Vernona Rieger wrote: CRM for notification. See Telephone encounter for: 07/19/18.  Patient's husband, [name] called and said he thought that Dr Derrel Nip was going to call her in an inhaler, he does not know the name and did not have much information to give me. He said he wants to talk to Dr Lupita Dawn nurse because she knows [phone]

## 2018-07-19 NOTE — Telephone Encounter (Signed)
Returned to you;  can you fill in the [facility] address etc?

## 2018-07-20 ENCOUNTER — Other Ambulatory Visit: Payer: Self-pay | Admitting: Internal Medicine

## 2018-07-20 ENCOUNTER — Telehealth: Payer: Self-pay | Admitting: Internal Medicine

## 2018-07-20 ENCOUNTER — Telehealth: Payer: Self-pay

## 2018-07-20 DIAGNOSIS — Z7951 Long term (current) use of inhaled steroids: Secondary | ICD-10-CM

## 2018-07-20 DIAGNOSIS — I2510 Atherosclerotic heart disease of native coronary artery without angina pectoris: Secondary | ICD-10-CM

## 2018-07-20 DIAGNOSIS — D869 Sarcoidosis, unspecified: Secondary | ICD-10-CM

## 2018-07-20 DIAGNOSIS — M5440 Lumbago with sciatica, unspecified side: Secondary | ICD-10-CM | POA: Diagnosis not present

## 2018-07-20 DIAGNOSIS — I255 Ischemic cardiomyopathy: Secondary | ICD-10-CM

## 2018-07-20 DIAGNOSIS — K760 Fatty (change of) liver, not elsewhere classified: Secondary | ICD-10-CM

## 2018-07-20 DIAGNOSIS — Z8701 Personal history of pneumonia (recurrent): Secondary | ICD-10-CM

## 2018-07-20 DIAGNOSIS — M810 Age-related osteoporosis without current pathological fracture: Secondary | ICD-10-CM

## 2018-07-20 DIAGNOSIS — M109 Gout, unspecified: Secondary | ICD-10-CM

## 2018-07-20 DIAGNOSIS — Z9181 History of falling: Secondary | ICD-10-CM

## 2018-07-20 DIAGNOSIS — Z7982 Long term (current) use of aspirin: Secondary | ICD-10-CM

## 2018-07-20 DIAGNOSIS — I5032 Chronic diastolic (congestive) heart failure: Secondary | ICD-10-CM | POA: Diagnosis not present

## 2018-07-20 DIAGNOSIS — D631 Anemia in chronic kidney disease: Secondary | ICD-10-CM | POA: Diagnosis not present

## 2018-07-20 DIAGNOSIS — I69391 Dysphagia following cerebral infarction: Secondary | ICD-10-CM

## 2018-07-20 DIAGNOSIS — G4733 Obstructive sleep apnea (adult) (pediatric): Secondary | ICD-10-CM

## 2018-07-20 DIAGNOSIS — S93401A Sprain of unspecified ligament of right ankle, initial encounter: Secondary | ICD-10-CM

## 2018-07-20 DIAGNOSIS — J441 Chronic obstructive pulmonary disease with (acute) exacerbation: Secondary | ICD-10-CM | POA: Diagnosis not present

## 2018-07-20 DIAGNOSIS — E509 Vitamin A deficiency, unspecified: Secondary | ICD-10-CM

## 2018-07-20 DIAGNOSIS — F411 Generalized anxiety disorder: Secondary | ICD-10-CM

## 2018-07-20 DIAGNOSIS — R1310 Dysphagia, unspecified: Secondary | ICD-10-CM

## 2018-07-20 DIAGNOSIS — E1122 Type 2 diabetes mellitus with diabetic chronic kidney disease: Secondary | ICD-10-CM | POA: Diagnosis not present

## 2018-07-20 DIAGNOSIS — Z89421 Acquired absence of other right toe(s): Secondary | ICD-10-CM

## 2018-07-20 DIAGNOSIS — G2581 Restless legs syndrome: Secondary | ICD-10-CM

## 2018-07-20 DIAGNOSIS — J9621 Acute and chronic respiratory failure with hypoxia: Secondary | ICD-10-CM | POA: Diagnosis not present

## 2018-07-20 DIAGNOSIS — Z87440 Personal history of urinary (tract) infections: Secondary | ICD-10-CM

## 2018-07-20 DIAGNOSIS — F0391 Unspecified dementia with behavioral disturbance: Secondary | ICD-10-CM

## 2018-07-20 DIAGNOSIS — N184 Chronic kidney disease, stage 4 (severe): Secondary | ICD-10-CM | POA: Diagnosis not present

## 2018-07-20 DIAGNOSIS — I130 Hypertensive heart and chronic kidney disease with heart failure and stage 1 through stage 4 chronic kidney disease, or unspecified chronic kidney disease: Secondary | ICD-10-CM | POA: Diagnosis not present

## 2018-07-20 DIAGNOSIS — Z87891 Personal history of nicotine dependence: Secondary | ICD-10-CM

## 2018-07-20 DIAGNOSIS — M1990 Unspecified osteoarthritis, unspecified site: Secondary | ICD-10-CM

## 2018-07-20 DIAGNOSIS — M4802 Spinal stenosis, cervical region: Secondary | ICD-10-CM

## 2018-07-20 DIAGNOSIS — Z981 Arthrodesis status: Secondary | ICD-10-CM

## 2018-07-20 DIAGNOSIS — E1151 Type 2 diabetes mellitus with diabetic peripheral angiopathy without gangrene: Secondary | ICD-10-CM

## 2018-07-20 DIAGNOSIS — M5412 Radiculopathy, cervical region: Secondary | ICD-10-CM

## 2018-07-20 DIAGNOSIS — Z955 Presence of coronary angioplasty implant and graft: Secondary | ICD-10-CM

## 2018-07-20 DIAGNOSIS — L84 Corns and callosities: Secondary | ICD-10-CM | POA: Diagnosis not present

## 2018-07-20 DIAGNOSIS — E1140 Type 2 diabetes mellitus with diabetic neuropathy, unspecified: Secondary | ICD-10-CM | POA: Diagnosis not present

## 2018-07-20 DIAGNOSIS — R911 Solitary pulmonary nodule: Secondary | ICD-10-CM

## 2018-07-20 DIAGNOSIS — Z7902 Long term (current) use of antithrombotics/antiplatelets: Secondary | ICD-10-CM

## 2018-07-20 MED ORDER — ALPRAZOLAM 0.25 MG PO TABS: 0.2500 mg | ORAL_TABLET | Freq: Two times a day (BID) | ORAL | 5 refills | Status: AC | PRN

## 2018-07-20 MED ORDER — IPRATROPIUM-ALBUTEROL 0.5-2.5 (3) MG/3ML IN SOLN: 3.0000 mL | Freq: Four times a day (QID) | RESPIRATORY_TRACT | 0 refills | Status: DC | PRN

## 2018-07-20 MED ORDER — IPRATROPIUM-ALBUTEROL 0.5-2.5 (3) MG/3ML IN SOLN: 3.0000 mL | Freq: Four times a day (QID) | RESPIRATORY_TRACT | 5 refills | Status: DC | PRN

## 2018-07-20 MED ORDER — ALPRAZOLAM 0.25 MG PO TABS: 0.2500 mg | ORAL_TABLET | Freq: Two times a day (BID) | ORAL | 5 refills | Status: DC | PRN

## 2018-07-20 NOTE — Telephone Encounter (Signed)
Spoke with the pharmacist and he stated that a PA was not required that they would just need a rx sent in. The pt's last refill was on 07/08/2018 by Dr. Janeice Robinson while she was in rehab and it was just a 14 day supply. The pharmacist stated that the rx was for .25mg  1 tablet BID PRN.

## 2018-07-20 NOTE — Telephone Encounter (Signed)
Spoke with pt to let her know that she see Dr. Ashby Dawes, Pulmonologist for her COPD and OSA and that he is the one that has prescribed the inhalers. Pt gave a verbal understanding.

## 2018-07-20 NOTE — Telephone Encounter (Signed)
I did not discuss any inhalhers.   She sees pulmonologist Dr Ashby Dawes whomanageds her COPD and OSA and  has prescribed the mometasone inhaler

## 2018-07-20 NOTE — Telephone Encounter (Signed)
Copied from [place] [id number]. Topic: Referral - Request for Referral >> Jul 20, 2018  1:57 PM Marin Olp L wrote: Has patient seen PCP for this complaint? No   *If NO, is insurance requiring patient see PCP for this issue before PCP can refer them? Referral for which specialty: orthopaedics Preferred provider/office: [facility] or Dr. Lupita Dawn recommendation Reason for referral: sprained right ankle and was seen by Urgent care who gave her an offloading boot and said she needs to f/u with ortho

## 2018-07-20 NOTE — Telephone Encounter (Signed)
Returned phone call to patient, requesting refill of duoneb. Confirmed pharmacy. Refill sent. Nothing further is needed at this time.

## 2018-07-20 NOTE — Telephone Encounter (Signed)
Spoke with pt to let her know that the alprazolam has been refilled and sent to local pharmacy. Pt gave a verbal understanding.

## 2018-07-20 NOTE — Telephone Encounter (Signed)
Urgent referral to Orthopedics made

## 2018-07-20 NOTE — Addendum Note (Signed)
Addended by: Vivia Ewing on: 07/20/2018 02:29 PM   Modules accepted: Orders

## 2018-07-20 NOTE — Telephone Encounter (Signed)
Copied from [place] [phone]. Topic: Quick Communication - Rx Refill/Question >> Jul 20, 2018  1:27 PM Pamela Collier wrote: Medication:  ALPRAZolam [name]) 0.25 MG tablet  Horris Latino from [place].  States that pharmacy is telling her that a prior authorization is needed before pt can get medication.  Pt wants to know what is going on.  Pt's spouse, Pamela Collier, can be reached at [phone]

## 2018-07-20 NOTE — Telephone Encounter (Signed)
Alprazolam refilled for 2 times daily prn and sent to local pharmacy

## 2018-07-20 NOTE — Addendum Note (Signed)
Addended by: [name] on: 07/20/2018 03:33 PM   Modules accepted: Orders

## 2018-07-21 DIAGNOSIS — E1122 Type 2 diabetes mellitus with diabetic chronic kidney disease: Secondary | ICD-10-CM | POA: Diagnosis not present

## 2018-07-21 DIAGNOSIS — I5032 Chronic diastolic (congestive) heart failure: Secondary | ICD-10-CM | POA: Diagnosis not present

## 2018-07-21 DIAGNOSIS — N184 Chronic kidney disease, stage 4 (severe): Secondary | ICD-10-CM | POA: Diagnosis not present

## 2018-07-21 DIAGNOSIS — J441 Chronic obstructive pulmonary disease with (acute) exacerbation: Secondary | ICD-10-CM | POA: Diagnosis not present

## 2018-07-21 DIAGNOSIS — J9621 Acute and chronic respiratory failure with hypoxia: Secondary | ICD-10-CM | POA: Diagnosis not present

## 2018-07-21 DIAGNOSIS — I130 Hypertensive heart and chronic kidney disease with heart failure and stage 1 through stage 4 chronic kidney disease, or unspecified chronic kidney disease: Secondary | ICD-10-CM | POA: Diagnosis not present

## 2018-07-21 NOTE — Telephone Encounter (Signed)
Spoke with pt's husband to let him know that the pt's paperwork has been completed and placed up front for pick up. Pt's husband gave a verbal understanding.

## 2018-07-22 DIAGNOSIS — N184 Chronic kidney disease, stage 4 (severe): Secondary | ICD-10-CM | POA: Diagnosis not present

## 2018-07-22 DIAGNOSIS — J441 Chronic obstructive pulmonary disease with (acute) exacerbation: Secondary | ICD-10-CM | POA: Diagnosis not present

## 2018-07-22 DIAGNOSIS — J9621 Acute and chronic respiratory failure with hypoxia: Secondary | ICD-10-CM | POA: Diagnosis not present

## 2018-07-22 DIAGNOSIS — E1122 Type 2 diabetes mellitus with diabetic chronic kidney disease: Secondary | ICD-10-CM | POA: Diagnosis not present

## 2018-07-22 DIAGNOSIS — I130 Hypertensive heart and chronic kidney disease with heart failure and stage 1 through stage 4 chronic kidney disease, or unspecified chronic kidney disease: Secondary | ICD-10-CM | POA: Diagnosis not present

## 2018-07-22 DIAGNOSIS — I5032 Chronic diastolic (congestive) heart failure: Secondary | ICD-10-CM | POA: Diagnosis not present

## 2018-07-23 ENCOUNTER — Other Ambulatory Visit: Payer: Self-pay

## 2018-07-23 ENCOUNTER — Telehealth: Payer: Self-pay | Admitting: Internal Medicine

## 2018-07-23 ENCOUNTER — Other Ambulatory Visit: Payer: Self-pay | Admitting: *Deleted

## 2018-07-23 DIAGNOSIS — J9621 Acute and chronic respiratory failure with hypoxia: Secondary | ICD-10-CM | POA: Diagnosis not present

## 2018-07-23 DIAGNOSIS — I5032 Chronic diastolic (congestive) heart failure: Secondary | ICD-10-CM | POA: Diagnosis not present

## 2018-07-23 DIAGNOSIS — J441 Chronic obstructive pulmonary disease with (acute) exacerbation: Secondary | ICD-10-CM | POA: Diagnosis not present

## 2018-07-23 DIAGNOSIS — I130 Hypertensive heart and chronic kidney disease with heart failure and stage 1 through stage 4 chronic kidney disease, or unspecified chronic kidney disease: Secondary | ICD-10-CM | POA: Diagnosis not present

## 2018-07-23 DIAGNOSIS — N184 Chronic kidney disease, stage 4 (severe): Secondary | ICD-10-CM | POA: Diagnosis not present

## 2018-07-23 DIAGNOSIS — E1122 Type 2 diabetes mellitus with diabetic chronic kidney disease: Secondary | ICD-10-CM | POA: Diagnosis not present

## 2018-07-23 NOTE — Patient Outreach (Addendum)
[facility]) Care Management  07/23/2018  Pamela Collier August 27, 1940 [id number]  Transition of care call   Successful outreach call to patient , reports that she is doing pretty good on today , taking it very slowly. She states she doesn't want to go back to the hospital or rehab.  Patient discussed recent visit to Urgent care regarding, problems with right ankle over last weekend. She discussed that she is now wearing a boot to her ankle.  Patient reports having an appointment with orthopedic on Monday.  Patient reports having home health RN and therapy visits on today.  Patient voiced looking forward to [place] to [place] with her son/family in the next 2 months.   Diabetes. Patient reports blood sugar today is 158, she reports continuing to monitor twice daily , She denies having recent low blood sugar range of 70 or below, reports having a 89 in the last week. Patient reports that she is taking insulin right after she eats . She admits taking 10 units of 70/30 in the am and 30 in the pm. Patient reports improvement in appetite, eating meals on wheels provided, husband assist with meals in morning and evening.  High Fall risk Patient with recent fall report and urgent care visit, Fall prevention measures reviewed. Patient is now mostly sitting in her den area, and not going up to second level that has at least 3 steps, unless her husband is present. She has a bsc to use near her chair and keeping frequent used items nearby.  Heart failure.  Patient now unable to weigh since wearing orthopedic boot, denies sob, increase in swelling or chest discomfort.  Medications  Patient husband is assisting patient making sure she is taking her medications, states it is irritating because she wants to do it on her own , but it is helping her. Encouraged continued to allow husband support.   Plan Will schedule final Transition of care call in the next week.  Patient unable to recall  or locate EMMI hand on falls sent or Heart failure packet, she will check on it once her husband returns.  Encouraged notifying MD of new concerns, worsening of Heart failure symptoms, shortness of breath , swelling, recurrent falls , to avoid readmission.  [name], RN, [place] Management Coordinator  [phone]- Mobile [phone]- Toll Free Main Office

## 2018-07-23 NOTE — Telephone Encounter (Signed)
Called and left vm for [facility].  Gave verbal orders for OT as requested.

## 2018-07-23 NOTE — Telephone Encounter (Signed)
Copied from [place] [phone]. Topic: Quick Communication - Home Health Verbal Orders >> Jul 23, 2018  9:09 AM Jodie Echevaria wrote: Caller/Agency: [facility] / [facility] Number: [phone]  ok to LM Requesting OT/PT/Skilled Nursing/Social Work/Speech Therapy: OT Frequency: 1 x wk for 4 wk's and recert also

## 2018-07-26 DIAGNOSIS — M5412 Radiculopathy, cervical region: Secondary | ICD-10-CM | POA: Diagnosis not present

## 2018-07-26 DIAGNOSIS — J9621 Acute and chronic respiratory failure with hypoxia: Secondary | ICD-10-CM | POA: Diagnosis not present

## 2018-07-26 DIAGNOSIS — I69391 Dysphagia following cerebral infarction: Secondary | ICD-10-CM | POA: Diagnosis not present

## 2018-07-26 DIAGNOSIS — D860 Sarcoidosis of lung: Secondary | ICD-10-CM | POA: Diagnosis not present

## 2018-07-26 DIAGNOSIS — M4802 Spinal stenosis, cervical region: Secondary | ICD-10-CM | POA: Diagnosis not present

## 2018-07-26 DIAGNOSIS — S96911A Strain of unspecified muscle and tendon at ankle and foot level, right foot, initial encounter: Secondary | ICD-10-CM | POA: Diagnosis not present

## 2018-07-26 DIAGNOSIS — E1140 Type 2 diabetes mellitus with diabetic neuropathy, unspecified: Secondary | ICD-10-CM | POA: Diagnosis not present

## 2018-07-26 DIAGNOSIS — F411 Generalized anxiety disorder: Secondary | ICD-10-CM | POA: Diagnosis not present

## 2018-07-26 DIAGNOSIS — J449 Chronic obstructive pulmonary disease, unspecified: Secondary | ICD-10-CM | POA: Diagnosis not present

## 2018-07-26 DIAGNOSIS — M810 Age-related osteoporosis without current pathological fracture: Secondary | ICD-10-CM | POA: Diagnosis not present

## 2018-07-26 DIAGNOSIS — E1151 Type 2 diabetes mellitus with diabetic peripheral angiopathy without gangrene: Secondary | ICD-10-CM | POA: Diagnosis not present

## 2018-07-26 DIAGNOSIS — I130 Hypertensive heart and chronic kidney disease with heart failure and stage 1 through stage 4 chronic kidney disease, or unspecified chronic kidney disease: Secondary | ICD-10-CM | POA: Diagnosis not present

## 2018-07-26 DIAGNOSIS — E509 Vitamin A deficiency, unspecified: Secondary | ICD-10-CM | POA: Diagnosis not present

## 2018-07-26 DIAGNOSIS — M109 Gout, unspecified: Secondary | ICD-10-CM | POA: Diagnosis not present

## 2018-07-26 DIAGNOSIS — R1310 Dysphagia, unspecified: Secondary | ICD-10-CM | POA: Diagnosis not present

## 2018-07-26 DIAGNOSIS — M5440 Lumbago with sciatica, unspecified side: Secondary | ICD-10-CM | POA: Diagnosis not present

## 2018-07-26 DIAGNOSIS — M1990 Unspecified osteoarthritis, unspecified site: Secondary | ICD-10-CM | POA: Diagnosis not present

## 2018-07-26 DIAGNOSIS — I5032 Chronic diastolic (congestive) heart failure: Secondary | ICD-10-CM | POA: Diagnosis not present

## 2018-07-26 DIAGNOSIS — F0391 Unspecified dementia with behavioral disturbance: Secondary | ICD-10-CM | POA: Diagnosis not present

## 2018-07-26 DIAGNOSIS — F0150 Vascular dementia without behavioral disturbance: Secondary | ICD-10-CM | POA: Diagnosis not present

## 2018-07-26 DIAGNOSIS — E1122 Type 2 diabetes mellitus with diabetic chronic kidney disease: Secondary | ICD-10-CM | POA: Diagnosis not present

## 2018-07-26 DIAGNOSIS — N184 Chronic kidney disease, stage 4 (severe): Secondary | ICD-10-CM | POA: Diagnosis not present

## 2018-07-26 DIAGNOSIS — I2510 Atherosclerotic heart disease of native coronary artery without angina pectoris: Secondary | ICD-10-CM | POA: Diagnosis not present

## 2018-07-26 DIAGNOSIS — K760 Fatty (change of) liver, not elsewhere classified: Secondary | ICD-10-CM | POA: Diagnosis not present

## 2018-07-26 DIAGNOSIS — D631 Anemia in chronic kidney disease: Secondary | ICD-10-CM | POA: Diagnosis not present

## 2018-07-26 DIAGNOSIS — L84 Corns and callosities: Secondary | ICD-10-CM | POA: Diagnosis not present

## 2018-07-27 ENCOUNTER — Other Ambulatory Visit: Payer: Self-pay

## 2018-07-27 ENCOUNTER — Telehealth: Payer: Self-pay | Admitting: Internal Medicine

## 2018-07-27 ENCOUNTER — Other Ambulatory Visit: Payer: Self-pay | Admitting: *Deleted

## 2018-07-27 DIAGNOSIS — I5032 Chronic diastolic (congestive) heart failure: Secondary | ICD-10-CM | POA: Diagnosis not present

## 2018-07-27 DIAGNOSIS — N184 Chronic kidney disease, stage 4 (severe): Secondary | ICD-10-CM | POA: Diagnosis not present

## 2018-07-27 DIAGNOSIS — J9621 Acute and chronic respiratory failure with hypoxia: Secondary | ICD-10-CM | POA: Diagnosis not present

## 2018-07-27 DIAGNOSIS — I130 Hypertensive heart and chronic kidney disease with heart failure and stage 1 through stage 4 chronic kidney disease, or unspecified chronic kidney disease: Secondary | ICD-10-CM | POA: Diagnosis not present

## 2018-07-27 DIAGNOSIS — J449 Chronic obstructive pulmonary disease, unspecified: Secondary | ICD-10-CM | POA: Diagnosis not present

## 2018-07-27 DIAGNOSIS — E1122 Type 2 diabetes mellitus with diabetic chronic kidney disease: Secondary | ICD-10-CM | POA: Diagnosis not present

## 2018-07-27 MED ORDER — IPRATROPIUM-ALBUTEROL 0.5-2.5 (3) MG/3ML IN SOLN: 3.0000 mL | Freq: Four times a day (QID) | RESPIRATORY_TRACT | 5 refills | Status: AC | PRN

## 2018-07-27 NOTE — Telephone Encounter (Signed)
ATC bri at [place]. Ling rang for >43min with no answer. Will call back.

## 2018-07-27 NOTE — Patient Outreach (Signed)
[facility]) Care Management  07/27/2018  Pamela BOHLIN 11-26-1940 [id number]   Transition of care    Successful outreach call to patient , she discussed feeling much better than a few weeks ago. Patient discussed visit with orthopedic doctor on Monday, she expressed being so happy to have the boot off of her foot, now she has a shoe.  Patient discussed that she is just taking it easy so that she will not fall. She again discussed she does not want to go back to ED hospital or rehab. She discussed her plans to move to [place] with her son by May.  She discussed having visit with home health therapy today and went through a good work out. Patient discussed healing of skin scrape areas on knees, reports that it site as been reviewed by home health RN on visit.   Patient discussed not being able to balance well on scales yet do to wearing orthopedic shoes. She denies increase in swelling or shortness of breath.  Patient reports that she has all supplies for her CPAP and wearing each night.   Patient reports that her blood sugar was have been doing pretty good she states reading of 200, 144, 120 and lowest reading per report of 89.  Patient discussed eating smaller meals and does much better than 3 bigger meals, patient reports that she continues to drink glucerna daily. Discussed with patient that it is a supplement not meal replacement , to help with blood sugar control.   Patient denies any new concerns she discussed that her husband continues to help her taking her medications .  Patient has completed transition to care period without readmission.   Plan  Will continue to follow patient in complex care management program.  Will plan follow call in the next 2 weeks.    [name], RN, [place] Management Coordinator  [phone]- Mobile [phone]- Toll Free Main Office

## 2018-07-27 NOTE — Telephone Encounter (Signed)
Prescription for Duoneb resent with J41.0 dx code for chronic bronchitis. Nothing further needed.

## 2018-07-27 NOTE — Telephone Encounter (Signed)
Copied from [place] [id number]. Topic: Quick Communication - Home Health Verbal Orders >> Jul 27, 2018  4:42 PM Nils Flack wrote: Caller/Agency:  [facility]  Callback Number: [phone] Requesting OT/PT/Skilled Nursing/Social Work/Speech Therapy:home health aid Frequency:  1 week1 2 week 2 1 week 1

## 2018-07-28 DIAGNOSIS — J9621 Acute and chronic respiratory failure with hypoxia: Secondary | ICD-10-CM | POA: Diagnosis not present

## 2018-07-28 DIAGNOSIS — I130 Hypertensive heart and chronic kidney disease with heart failure and stage 1 through stage 4 chronic kidney disease, or unspecified chronic kidney disease: Secondary | ICD-10-CM | POA: Diagnosis not present

## 2018-07-28 DIAGNOSIS — E1122 Type 2 diabetes mellitus with diabetic chronic kidney disease: Secondary | ICD-10-CM | POA: Diagnosis not present

## 2018-07-28 DIAGNOSIS — N184 Chronic kidney disease, stage 4 (severe): Secondary | ICD-10-CM | POA: Diagnosis not present

## 2018-07-28 DIAGNOSIS — I5032 Chronic diastolic (congestive) heart failure: Secondary | ICD-10-CM | POA: Diagnosis not present

## 2018-07-28 DIAGNOSIS — J449 Chronic obstructive pulmonary disease, unspecified: Secondary | ICD-10-CM | POA: Diagnosis not present

## 2018-07-29 ENCOUNTER — Other Ambulatory Visit: Payer: Self-pay | Admitting: Internal Medicine

## 2018-07-30 ENCOUNTER — Emergency Department: Payer: Medicare Other

## 2018-07-30 ENCOUNTER — Other Ambulatory Visit: Payer: Self-pay

## 2018-07-30 ENCOUNTER — Inpatient Hospital Stay: Payer: Medicare Other

## 2018-07-30 ENCOUNTER — Other Ambulatory Visit: Payer: Self-pay | Admitting: *Deleted

## 2018-07-30 ENCOUNTER — Inpatient Hospital Stay
Admission: EM | Admit: 2018-07-30 | Discharge: 2018-08-01 | DRG: 291 | Disposition: A | Discharge status: Home Health | Payer: Medicare Other | Attending: Internal Medicine | Admitting: Internal Medicine

## 2018-07-30 DIAGNOSIS — J90 Pleural effusion, not elsewhere classified: Secondary | ICD-10-CM | POA: Diagnosis not present

## 2018-07-30 DIAGNOSIS — G4733 Obstructive sleep apnea (adult) (pediatric): Secondary | ICD-10-CM | POA: Diagnosis present

## 2018-07-30 DIAGNOSIS — Z882 Allergy status to sulfonamides status: Secondary | ICD-10-CM | POA: Diagnosis not present

## 2018-07-30 DIAGNOSIS — E440 Moderate protein-calorie malnutrition: Secondary | ICD-10-CM | POA: Diagnosis present

## 2018-07-30 DIAGNOSIS — D860 Sarcoidosis of lung: Secondary | ICD-10-CM | POA: Diagnosis present

## 2018-07-30 DIAGNOSIS — Z794 Long term (current) use of insulin: Secondary | ICD-10-CM

## 2018-07-30 DIAGNOSIS — Z825 Family history of asthma and other chronic lower respiratory diseases: Secondary | ICD-10-CM

## 2018-07-30 DIAGNOSIS — E1151 Type 2 diabetes mellitus with diabetic peripheral angiopathy without gangrene: Secondary | ICD-10-CM | POA: Diagnosis present

## 2018-07-30 DIAGNOSIS — E785 Hyperlipidemia, unspecified: Secondary | ICD-10-CM | POA: Diagnosis present

## 2018-07-30 DIAGNOSIS — I509 Heart failure, unspecified: Secondary | ICD-10-CM | POA: Diagnosis not present

## 2018-07-30 DIAGNOSIS — E876 Hypokalemia: Secondary | ICD-10-CM | POA: Diagnosis present

## 2018-07-30 DIAGNOSIS — Z955 Presence of coronary angioplasty implant and graft: Secondary | ICD-10-CM

## 2018-07-30 DIAGNOSIS — Z881 Allergy status to other antibiotic agents status: Secondary | ICD-10-CM

## 2018-07-30 DIAGNOSIS — R52 Pain, unspecified: Secondary | ICD-10-CM | POA: Diagnosis not present

## 2018-07-30 DIAGNOSIS — Z9049 Acquired absence of other specified parts of digestive tract: Secondary | ICD-10-CM

## 2018-07-30 DIAGNOSIS — I5032 Chronic diastolic (congestive) heart failure: Secondary | ICD-10-CM | POA: Diagnosis not present

## 2018-07-30 DIAGNOSIS — I110 Hypertensive heart disease with heart failure: Secondary | ICD-10-CM | POA: Diagnosis not present

## 2018-07-30 DIAGNOSIS — M810 Age-related osteoporosis without current pathological fracture: Secondary | ICD-10-CM | POA: Diagnosis present

## 2018-07-30 DIAGNOSIS — J811 Chronic pulmonary edema: Secondary | ICD-10-CM | POA: Diagnosis present

## 2018-07-30 DIAGNOSIS — R0601 Orthopnea: Secondary | ICD-10-CM | POA: Diagnosis not present

## 2018-07-30 DIAGNOSIS — Z9889 Other specified postprocedural states: Secondary | ICD-10-CM

## 2018-07-30 DIAGNOSIS — I10 Essential (primary) hypertension: Secondary | ICD-10-CM | POA: Diagnosis not present

## 2018-07-30 DIAGNOSIS — Z7951 Long term (current) use of inhaled steroids: Secondary | ICD-10-CM

## 2018-07-30 DIAGNOSIS — Z90710 Acquired absence of both cervix and uterus: Secondary | ICD-10-CM | POA: Diagnosis not present

## 2018-07-30 DIAGNOSIS — M5134 Other intervertebral disc degeneration, thoracic region: Secondary | ICD-10-CM | POA: Diagnosis not present

## 2018-07-30 DIAGNOSIS — I130 Hypertensive heart and chronic kidney disease with heart failure and stage 1 through stage 4 chronic kidney disease, or unspecified chronic kidney disease: Secondary | ICD-10-CM | POA: Diagnosis present

## 2018-07-30 DIAGNOSIS — I248 Other forms of acute ischemic heart disease: Secondary | ICD-10-CM

## 2018-07-30 DIAGNOSIS — E1122 Type 2 diabetes mellitus with diabetic chronic kidney disease: Secondary | ICD-10-CM | POA: Diagnosis present

## 2018-07-30 DIAGNOSIS — M5489 Other dorsalgia: Secondary | ICD-10-CM | POA: Diagnosis not present

## 2018-07-30 DIAGNOSIS — N183 Chronic kidney disease, stage 3 (moderate): Secondary | ICD-10-CM | POA: Diagnosis present

## 2018-07-30 DIAGNOSIS — E119 Type 2 diabetes mellitus without complications: Secondary | ICD-10-CM | POA: Diagnosis not present

## 2018-07-30 DIAGNOSIS — F418 Other specified anxiety disorders: Secondary | ICD-10-CM | POA: Diagnosis present

## 2018-07-30 DIAGNOSIS — F0390 Unspecified dementia without behavioral disturbance: Secondary | ICD-10-CM | POA: Diagnosis present

## 2018-07-30 DIAGNOSIS — Z803 Family history of malignant neoplasm of breast: Secondary | ICD-10-CM

## 2018-07-30 DIAGNOSIS — J449 Chronic obstructive pulmonary disease, unspecified: Secondary | ICD-10-CM | POA: Diagnosis present

## 2018-07-30 DIAGNOSIS — Z87442 Personal history of urinary calculi: Secondary | ICD-10-CM

## 2018-07-30 DIAGNOSIS — Z89421 Acquired absence of other right toe(s): Secondary | ICD-10-CM

## 2018-07-30 DIAGNOSIS — R079 Chest pain, unspecified: Secondary | ICD-10-CM | POA: Diagnosis not present

## 2018-07-30 DIAGNOSIS — Z888 Allergy status to other drugs, medicaments and biological substances status: Secondary | ICD-10-CM

## 2018-07-30 DIAGNOSIS — Z91041 Radiographic dye allergy status: Secondary | ICD-10-CM

## 2018-07-30 DIAGNOSIS — I517 Cardiomegaly: Secondary | ICD-10-CM | POA: Diagnosis not present

## 2018-07-30 DIAGNOSIS — I2510 Atherosclerotic heart disease of native coronary artery without angina pectoris: Secondary | ICD-10-CM | POA: Diagnosis present

## 2018-07-30 DIAGNOSIS — Z8673 Personal history of transient ischemic attack (TIA), and cerebral infarction without residual deficits: Secondary | ICD-10-CM

## 2018-07-30 DIAGNOSIS — Z6825 Body mass index (BMI) 25.0-25.9, adult: Secondary | ICD-10-CM

## 2018-07-30 DIAGNOSIS — I25118 Atherosclerotic heart disease of native coronary artery with other forms of angina pectoris: Secondary | ICD-10-CM | POA: Diagnosis not present

## 2018-07-30 DIAGNOSIS — I959 Hypotension, unspecified: Secondary | ICD-10-CM | POA: Diagnosis not present

## 2018-07-30 DIAGNOSIS — Z7901 Long term (current) use of anticoagulants: Secondary | ICD-10-CM

## 2018-07-30 DIAGNOSIS — I499 Cardiac arrhythmia, unspecified: Secondary | ICD-10-CM | POA: Diagnosis not present

## 2018-07-30 DIAGNOSIS — Z79891 Long term (current) use of opiate analgesic: Secondary | ICD-10-CM

## 2018-07-30 DIAGNOSIS — Z8249 Family history of ischemic heart disease and other diseases of the circulatory system: Secondary | ICD-10-CM

## 2018-07-30 DIAGNOSIS — J918 Pleural effusion in other conditions classified elsewhere: Secondary | ICD-10-CM | POA: Diagnosis present

## 2018-07-30 DIAGNOSIS — Z7952 Long term (current) use of systemic steroids: Secondary | ICD-10-CM

## 2018-07-30 DIAGNOSIS — Z79899 Other long term (current) drug therapy: Secondary | ICD-10-CM

## 2018-07-30 DIAGNOSIS — I5033 Acute on chronic diastolic (congestive) heart failure: Secondary | ICD-10-CM | POA: Diagnosis not present

## 2018-07-30 DIAGNOSIS — M549 Dorsalgia, unspecified: Secondary | ICD-10-CM

## 2018-07-30 DIAGNOSIS — R188 Other ascites: Secondary | ICD-10-CM

## 2018-07-30 HISTORY — DX: Disorder of arteries and arterioles, unspecified: I77.9

## 2018-07-30 HISTORY — DX: Peripheral vascular disease, unspecified: I73.9

## 2018-07-30 HISTORY — DX: Pleural effusion, not elsewhere classified: J90

## 2018-07-30 LAB — C-REACTIVE PROTEIN: CRP: <0.8 mg/dL (ref <1.0)

## 2018-07-30 LAB — CBC WITH DIFFERENTIAL/PLATELET
Abs Immature Granulocytes: 0.11 10*3/uL — ABNORMAL HIGH (ref 0.00–0.07)
BASOS PCT: 0 %
Basophils Absolute: 0 10*3/uL (ref 0.0–0.1)
Eosinophils Absolute: 0.1 10*3/uL (ref 0.0–0.5)
Eosinophils Relative: 1 %
HCT: 34.9 % — ABNORMAL LOW (ref 36.0–46.0)
Hemoglobin: 11.1 g/dL — ABNORMAL LOW (ref 12.0–15.0)
Immature Granulocytes: 1 %
Lymphocytes Relative: 20 %
Lymphs Abs: 2.2 10*3/uL (ref 0.7–4.0)
MCH: 28.3 pg (ref 26.0–34.0)
MCHC: 31.8 g/dL (ref 30.0–36.0)
MCV: 89 fL (ref 80.0–100.0)
Monocytes Absolute: 1 10*3/uL (ref 0.1–1.0)
Monocytes Relative: 10 %
NRBC: 0 % (ref 0.0–0.2)
Neutro Abs: 7.4 10*3/uL (ref 1.7–7.7)
Neutrophils Relative %: 68 %
Platelets: 376 10*3/uL (ref 150–400)
RBC: 3.92 MIL/uL (ref 3.87–5.11)
RDW: 16.2 % — ABNORMAL HIGH (ref 11.5–15.5)
WBC: 10.8 10*3/uL — ABNORMAL HIGH (ref 4.0–10.5)

## 2018-07-30 LAB — CBC
HCT: 34.9 % — ABNORMAL LOW (ref 36.0–46.0)
Hemoglobin: 11 g/dL — ABNORMAL LOW (ref 12.0–15.0)
MCH: 28 pg (ref 26.0–34.0)
MCHC: 31.5 g/dL (ref 30.0–36.0)
MCV: 88.8 fL (ref 80.0–100.0)
PLATELETS: 366 10*3/uL (ref 150–400)
RBC: 3.93 MIL/uL (ref 3.87–5.11)
RDW: 16.1 % — ABNORMAL HIGH (ref 11.5–15.5)
WBC: 10.9 10*3/uL — ABNORMAL HIGH (ref 4.0–10.5)
nRBC: 0 % (ref 0.0–0.2)

## 2018-07-30 LAB — HEPATIC FUNCTION PANEL
ALT: 12 U/L (ref 0–44)
AST: 16 U/L (ref 15–41)
Albumin: 3.3 g/dL — ABNORMAL LOW (ref 3.5–5.0)
Alkaline Phosphatase: 113 U/L (ref 38–126)
Bilirubin, Direct: 0.1 mg/dL (ref 0.0–0.2)
Indirect Bilirubin: 0.4 mg/dL (ref 0.3–0.9)
TOTAL PROTEIN: 6.8 g/dL (ref 6.5–8.1)
Total Bilirubin: 0.5 mg/dL (ref 0.3–1.2)

## 2018-07-30 LAB — BODY FLUID CELL COUNT WITH DIFFERENTIAL
EOS FL: 0 %
Lymphs, Fluid: 64 %
Monocyte-Macrophage-Serous Fluid: 28 %
Neutrophil Count, Fluid: 8 %
Total Nucleated Cell Count, Fluid: 60 cu mm

## 2018-07-30 LAB — ALBUMIN, PLEURAL OR PERITONEAL FLUID: Albumin, Fluid: 1.6 g/dL

## 2018-07-30 LAB — GLUCOSE, CAPILLARY
Glucose-Capillary: 118 mg/dL — ABNORMAL HIGH (ref 70–99)
Glucose-Capillary: 193 mg/dL — ABNORMAL HIGH (ref 70–99)

## 2018-07-30 LAB — BRAIN NATRIURETIC PEPTIDE: B Natriuretic Peptide: 391 pg/mL — ABNORMAL HIGH (ref 0.0–100.0)

## 2018-07-30 LAB — PROTEIN, PLEURAL OR PERITONEAL FLUID: Total protein, fluid: <3 g/dL

## 2018-07-30 LAB — BASIC METABOLIC PANEL
Anion gap: 7 (ref 5–15)
BUN: 21 mg/dL (ref 8–23)
CO2: 28 mmol/L (ref 22–32)
Calcium: 8.9 mg/dL (ref 8.9–10.3)
Chloride: 108 mmol/L (ref 98–111)
Creatinine, Ser: 1.27 mg/dL — ABNORMAL HIGH (ref 0.44–1.00)
GFR calc Af Amer: 47 mL/min — ABNORMAL LOW (ref >60)
GFR calc non Af Amer: 40 mL/min — ABNORMAL LOW (ref >60)
Glucose, Bld: 67 mg/dL — ABNORMAL LOW (ref 70–99)
Potassium: 3.1 mmol/L — ABNORMAL LOW (ref 3.5–5.1)
SODIUM: 143 mmol/L (ref 135–145)

## 2018-07-30 LAB — TROPONIN I: TROPONIN I: 0.05 ng/mL — AB (ref <0.03)

## 2018-07-30 LAB — LIPASE, BLOOD: Lipase: 59 U/L — ABNORMAL HIGH (ref 11–51)

## 2018-07-30 LAB — GLUCOSE, PLEURAL OR PERITONEAL FLUID: Glucose, Fluid: 75 mg/dL

## 2018-07-30 LAB — PATHOLOGIST SMEAR REVIEW

## 2018-07-30 LAB — SEDIMENTATION RATE: Sed Rate: 84 mm/hr — ABNORMAL HIGH (ref 0–30)

## 2018-07-30 MED ORDER — IPRATROPIUM-ALBUTEROL 0.5-2.5 (3) MG/3ML IN SOLN: 3.0000 mL | Freq: Four times a day (QID) | RESPIRATORY_TRACT | Status: DC | PRN

## 2018-07-30 MED ORDER — DOCUSATE SODIUM 100 MG PO CAPS: 100.0000 mg | ORAL_CAPSULE | Freq: Two times a day (BID) | ORAL | Status: DC | PRN

## 2018-07-30 MED ORDER — FUROSEMIDE 10 MG/ML IJ SOLN
20.0000 mg | Freq: Two times a day (BID) | INTRAMUSCULAR | Status: DC
  Administered 2018-07-30 – 2018-08-01 (×4): 20 mg via INTRAVENOUS
  Filled 2018-07-30 (×4): qty 2

## 2018-07-30 MED ORDER — PROPRANOLOL HCL 10 MG PO TABS
10.0000 mg | ORAL_TABLET | Freq: Three times a day (TID) | ORAL | Status: DC
  Administered 2018-07-30 – 2018-08-01 (×7): 10 mg via ORAL
  Filled 2018-07-30 (×9): qty 1

## 2018-07-30 MED ORDER — DICYCLOMINE HCL 10 MG PO CAPS
10.0000 mg | ORAL_CAPSULE | Freq: Three times a day (TID) | ORAL | Status: DC
  Administered 2018-07-30 – 2018-08-01 (×8): 10 mg via ORAL
  Filled 2018-07-30 (×8): qty 1

## 2018-07-30 MED ORDER — ACETAMINOPHEN 500 MG PO TABS: 1000.0000 mg | ORAL_TABLET | Freq: Four times a day (QID) | ORAL | Status: DC | PRN

## 2018-07-30 MED ORDER — ASPIRIN EC 81 MG PO TBEC
81.0000 mg | DELAYED_RELEASE_TABLET | Freq: Every day | ORAL | Status: DC
  Administered 2018-07-30 – 2018-08-01 (×3): 81 mg via ORAL
  Filled 2018-07-30 (×3): qty 1

## 2018-07-30 MED ORDER — ROSUVASTATIN CALCIUM 20 MG PO TABS
20.0000 mg | ORAL_TABLET | Freq: Every day | ORAL | Status: DC
  Administered 2018-07-30 – 2018-08-01 (×3): 20 mg via ORAL
  Filled 2018-07-30 (×3): qty 1
  Filled 2018-07-30 (×3): qty 2

## 2018-07-30 MED ORDER — INSULIN ASPART PROT & ASPART (70-30 MIX) 100 UNIT/ML ~~LOC~~ SUSP
20.0000 [IU] | Freq: Two times a day (BID) | SUBCUTANEOUS | Status: DC
  Administered 2018-07-31 – 2018-08-01 (×3): 20 [IU] via SUBCUTANEOUS
  Filled 2018-07-30 (×3): qty 10

## 2018-07-30 MED ORDER — PREGABALIN 50 MG PO CAPS
100.0000 mg | ORAL_CAPSULE | Freq: Two times a day (BID) | ORAL | Status: DC
  Administered 2018-07-30 – 2018-08-01 (×5): 100 mg via ORAL
  Filled 2018-07-30 (×5): qty 2

## 2018-07-30 MED ORDER — HYDRALAZINE HCL 25 MG PO TABS
25.0000 mg | ORAL_TABLET | Freq: Three times a day (TID) | ORAL | Status: DC
  Administered 2018-07-30 – 2018-08-01 (×6): 25 mg via ORAL
  Filled 2018-07-30 (×6): qty 1

## 2018-07-30 MED ORDER — HEPARIN SODIUM (PORCINE) 5000 UNIT/ML IJ SOLN
5000.0000 [IU] | Freq: Three times a day (TID) | INTRAMUSCULAR | Status: DC
  Administered 2018-07-30 – 2018-08-01 (×5): 5000 [IU] via SUBCUTANEOUS
  Filled 2018-07-30 (×5): qty 1

## 2018-07-30 MED ORDER — ALPRAZOLAM 0.25 MG PO TABS
0.2500 mg | ORAL_TABLET | Freq: Two times a day (BID) | ORAL | Status: DC | PRN
  Administered 2018-07-30 – 2018-07-31 (×2): 0.25 mg via ORAL
  Filled 2018-07-30 (×2): qty 1

## 2018-07-30 MED ORDER — DONEPEZIL HCL 5 MG PO TABS
10.0000 mg | ORAL_TABLET | Freq: Every day | ORAL | Status: DC
  Administered 2018-07-30 – 2018-07-31 (×2): 10 mg via ORAL
  Filled 2018-07-30 (×3): qty 2

## 2018-07-30 MED ORDER — SODIUM CHLORIDE 0.9% FLUSH: 3.0000 mL | Freq: Once | INTRAVENOUS | Status: DC

## 2018-07-30 MED ORDER — ISOSORBIDE MONONITRATE ER 30 MG PO TB24
30.0000 mg | ORAL_TABLET | Freq: Every day | ORAL | Status: DC
  Administered 2018-07-30 – 2018-08-01 (×3): 30 mg via ORAL
  Filled 2018-07-30 (×3): qty 1

## 2018-07-30 MED ORDER — HEPARIN SODIUM (PORCINE) 5000 UNIT/ML IJ SOLN: 5000.0000 [IU] | Freq: Three times a day (TID) | INTRAMUSCULAR | Status: DC

## 2018-07-30 MED ORDER — AMLODIPINE BESYLATE 10 MG PO TABS
10.0000 mg | ORAL_TABLET | Freq: Every day | ORAL | Status: DC
  Administered 2018-07-30 – 2018-08-01 (×3): 10 mg via ORAL
  Filled 2018-07-30 (×3): qty 1

## 2018-07-30 MED ORDER — FUROSEMIDE 10 MG/ML IJ SOLN: 20.0000 mg | Freq: Three times a day (TID) | INTRAMUSCULAR | Status: DC

## 2018-07-30 MED ORDER — ALLOPURINOL 100 MG PO TABS
100.0000 mg | ORAL_TABLET | Freq: Every day | ORAL | Status: DC
  Administered 2018-07-30 – 2018-08-01 (×3): 100 mg via ORAL
  Filled 2018-07-30 (×3): qty 1

## 2018-07-30 MED ORDER — POTASSIUM CHLORIDE CRYS ER 20 MEQ PO TBCR
40.0000 meq | EXTENDED_RELEASE_TABLET | Freq: Two times a day (BID) | ORAL | Status: DC
  Administered 2018-07-30 – 2018-08-01 (×5): 40 meq via ORAL
  Filled 2018-07-30 (×5): qty 2

## 2018-07-30 MED ORDER — FUROSEMIDE 10 MG/ML IJ SOLN
20.0000 mg | Freq: Once | INTRAMUSCULAR | Status: AC
  Administered 2018-07-30: 20 mg via INTRAVENOUS
  Filled 2018-07-30: qty 4

## 2018-07-30 MED ORDER — DIPHENOXYLATE-ATROPINE 2.5-0.025 MG PO TABS: 1.0000 | ORAL_TABLET | Freq: Four times a day (QID) | ORAL | Status: DC | PRN

## 2018-07-30 MED ORDER — ONDANSETRON HCL 4 MG PO TABS: 4.0000 mg | ORAL_TABLET | Freq: Three times a day (TID) | ORAL | Status: DC | PRN

## 2018-07-30 MED ORDER — SENNA 8.6 MG PO TABS: 1.0000 | ORAL_TABLET | Freq: Two times a day (BID) | ORAL | Status: DC | PRN

## 2018-07-30 MED ORDER — TICAGRELOR 60 MG PO TABS
60.0000 mg | ORAL_TABLET | Freq: Two times a day (BID) | ORAL | Status: DC
  Administered 2018-07-30 – 2018-08-01 (×5): 60 mg via ORAL
  Filled 2018-07-30 (×6): qty 1

## 2018-07-30 MED ORDER — GUAIFENESIN-DM 100-10 MG/5ML PO SYRP
5.0000 mL | ORAL_SOLUTION | ORAL | Status: DC | PRN
  Filled 2018-07-30: qty 5

## 2018-07-30 MED ORDER — GUAIFENESIN ER 600 MG PO TB12
600.0000 mg | ORAL_TABLET | Freq: Two times a day (BID) | ORAL | Status: DC
  Administered 2018-07-30 – 2018-08-01 (×4): 600 mg via ORAL
  Filled 2018-07-30 (×4): qty 1

## 2018-07-30 NOTE — H&P (Signed)
[facility] [name]    MR#:  [id number]  DATE OF BIRTH:  10-10-1940  DATE OF ADMISSION:  07/30/2018  PRIMARY CARE PHYSICIAN: [name], MD   REQUESTING/REFERRING PHYSICIAN: Cinda Quest  CHIEF COMPLAINT:   Chief Complaint  Patient presents with  . Chest Pain  . Back Pain    HISTORY OF PRESENT ILLNESS: Pamela Collier  is a 78 y.o. female with a known history of carotid artery disease, cerebral infarct, chronic diastolic CHF ejection fraction 55%, COPD, coronary artery disease, depression, diabetes, hyperlipidemia, pleural effusion-was feeling short of breath for last few days without cough chills.  Had some chest pain no palpitation.  She denies any leg edema. She had pleural effusion in the past and required thoracentesis few months ago as per her. Noted to have significant pleural effusion on the right side on CT scan likely due to CHF and advised to admit to medical services.  PAST MEDICAL HISTORY:   Past Medical History:  Diagnosis Date  . Acute respiratory failure ([place]) 11/21/2014  . Anxiety   . Arthritis 08/05/2013  . Atherosclerotic peripheral vascular disease with gangrene [facility]) august 2012  . Carotid arterial disease ([place])    a. 10/3014 RICA 01-09, LICA 32-35.  [name] Cerebral infarct ([place]) 08/17/2013  . Chronic diastolic CHF (congestive heart failure) (HCC)    a. EF 50-55% by echo in 04/2015; b. 11/2016 Echo: EF 55-60%, Gr1 DD; c. 05/2018 Echo: EF 60-65%, no rwma.  [name] COPD (chronic obstructive pulmonary disease) ([place])   . Coronary artery disease, occlusive    a. Previous PCI to the LAD, LCx, and RCA in 2010, 2011, 2013, and 2016. All performed in [place]; b. 11/2016 Lexiscan MV: EF 43%, no ischemia->low risk.  . 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 Diabetic diarrhea ([place]) 10/03/2014  . DM type 2, uncontrolled, with renal complications ([place]) 09/16/3218  . Hepatic steatosis    by CT abd pelvis  . History of Ischemic  Cardiomyopathy    a. EF 35 to 40% by echo in 2013 but subsequently normalized.  [name] History of kidney stones    at a younger age  . Hyperlipidemia LDL goal <100 02/23/2014  . Hypertensive heart disease   . Osteoporosis, post-menopausal   . 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 Pill esophagitis   . Pleural effusion 10/25/2012   10/2012 CT chest >> small to moderate R lung effusion>> chylothorax, 100% lymphs 10/2013 thoracentesis> cytology negative, WBC 1471, > 90% "small lymphs" 01/2014 CT chest> near complete resolution of pleural effusion, stable lymphadenopathy 08/2014 CT chest [place] ([facility]): small right sided effusion decreased in size, stable mediastinal lymphadenopathy 1.0cm largest, 74m . 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 Recurrent pleural effusion on right    a. 05/2018 s/p thoracentesis -->7542m  . Sleep apnea   . Stage III chronic kidney disease ([place]    PAST SURGICAL HISTORY:  Past Surgical History:  Procedure Laterality Date  . ABDOMINAL HYSTERECTOMY     at ge 39. secondary to bleeding/partial  . ANTERIOR CERVICAL DECOMP/DISCECTOMY FUSION N/A 10/10/2016  Procedure: Anterior Cervical Discectomy Fusion - Cervical four4- five - Cervical five-Cervical six - Cervical six-Cervical seven;  Surgeon: Earnie Larsson, MD;  Location: [place];  Service: Neurosurgery;  Laterality: N/A;  . BREAST CYST ASPIRATION Right   . CARPAL TUNNEL RELEASE Bilateral   . CHOLECYSTECTOMY     in [place]   . COLONOSCOPY WITH PROPOFOL N/A 01/09/2015   Procedure: COLONOSCOPY WITH PROPOFOL;  Surgeon: Lucilla Lame, MD;  Location: [facility] ENDOSCOPY;  Service: Endoscopy;   Laterality: N/A;  . CORONARY ANGIOPLASTY WITH STENT PLACEMENT     [place]; [facility]  . EYE SURGERY     bil cataracts  . HERNIA REPAIR     umbilical/ Dr Pat Patrick  . OOPHORECTOMY    . PTCA  August 2012   Right Posterior tibial artery , [place]  . TOE AMPUTATION  Sept 2012   Right 2nd toe, [place]    SOCIAL HISTORY:  Social History   Tobacco Use  . Smoking status: Never Smoker  . Smokeless tobacco: Never Used  Substance Use Topics  . Alcohol use: No    Alcohol/week: 0.0 standard drinks    FAMILY HISTORY:  Family History  Problem Relation Age of Onset  . Heart disease Father   . Heart attack Father   . Heart disease Sister   . Heart attack Sister   . Heart disease Brother   . Heart attack Brother   . Tremor Mother   . Asthma Grandchild   . Breast cancer Maternal Aunt     DRUG ALLERGIES:  Allergies  Allergen Reactions  . Cephalexin Itching  . Contrast Media [Iodinated Diagnostic Agents] Itching  . Doxycycline Itching  . Gabapentin Itching  . Sulfa Antibiotics Itching    REVIEW OF SYSTEMS:   CONSTITUTIONAL: No fever, fatigue or weakness.  EYES: No blurred or double vision.  EARS, NOSE, AND THROAT: No tinnitus or ear pain.  RESPIRATORY: No cough, have shortness of breath, no wheezing or hemoptysis.  CARDIOVASCULAR: No chest pain, orthopnea, edema.  GASTROINTESTINAL: No nausea, vomiting, diarrhea or abdominal pain.  GENITOURINARY: No dysuria, hematuria.  ENDOCRINE: No polyuria, nocturia,  HEMATOLOGY: No anemia, easy bruising or bleeding SKIN: No rash or lesion. MUSCULOSKELETAL: No joint pain or arthritis.   NEUROLOGIC: No tingling, numbness, weakness.  PSYCHIATRY: No anxiety or depression.   MEDICATIONS AT HOME:  Prior to Admission medications   Medication Sig Start Date End Date Taking? Authorizing Provider  acetaminophen (TYLENOL) 500 MG tablet Take 1,000 mg by mouth every 6 (six) hours as needed for mild pain or headache.    Yes  [provider]  ALPRAZolam (XANAX) 0.25 MG tablet Take 1 tablet (0.25 mg total) by mouth 2 (two) times daily as needed for anxiety. 07/20/18  Yes [name], MD  amLODipine (NORVASC) 10 MG tablet Take 10 mg by mouth daily.   Yes [provider]  aspirin EC 81 MG tablet Take 81 mg by mouth daily.   Yes [provider]  B-D UF III MINI PEN NEEDLES 31G X 5 MM MISC USE THREE TIMES A DAY 02/18/18  Yes [name], MD  blood glucose meter kit and supplies Dispense based on patient and insurance preference. Use up to three times daily as directed. (FOR ICD-10 E11.21) 06/03/18  Yes [name], MD  donepezil (ARICEPT) 10 MG tablet Take 10 mg by mouth at bedtime.   Yes [provider]  furosemide (LASIX) 20 MG tablet Take 1 tablet (  20 mg total) by mouth daily. Take 1 tablet (20 mg ) once daily on Monday, Wednesday and Friday. 06/08/18  Yes Gladstone Lighter, MD  glucose blood (ACCU-CHEK AVIVA PLUS) test strip USE TO CHECK GLUCOSE THREE TIMES DAILY 02/16/18  Yes [name], MD  glucose blood test strip Use to check blood sugars 2 times daily 02/16/18  Yes [name], MD  guaiFENesin (MUCINEX) 600 MG 12 hr tablet Take 600 mg by mouth 2 (two) times daily.   Yes [provider]  HUMALOG MIX 75/25 KWIKPEN (75-25) 100 UNIT/ML Kwikpen 20 units with breakfast and 30 units with evening meal Patient taking differently: Inject 20-30 Units into the skin See admin instructions. Inject 20u under the skin with breakfast and 30u under the skin with evening meal 07/13/18  Yes [name], MD  hydrALAZINE (APRESOLINE) 25 MG tablet Take 1 tablet (25 mg total) by mouth 3 (three) times daily. 07/05/18 10/03/18 Yes Hackney, Tina A, FNP  ipratropium-albuterol (DUONEB) 0.5-2.5 (3) MG/3ML SOLN Take 3 mLs by nebulization every 6 (six) hours as needed (wheezing, shortness of breath). 07/27/18  Yes [name], MD  isosorbide mononitrate (IMDUR) 30 MG 24 hr tablet  Take 1 tablet (30 mg total) by mouth daily. 03/03/18  Yes Gollan, Kathlene November, MD  nitroGLYCERIN (NITROLINGUAL) 0.4 MG/SPRAY spray Place 1 spray under the tongue every 5 (five) minutes x 3 doses as needed for chest pain. 01/14/17  Yes [name], MD  ondansetron (ZOFRAN) 4 MG tablet Take 4 mg by mouth every 8 (eight) hours as needed for nausea or vomiting.   Yes [provider]  predniSONE (DELTASONE) 10 MG tablet Take 10 mg by mouth daily with breakfast.   Yes [provider]  pregabalin (LYRICA) 100 MG capsule Take 100 mg by mouth 2 (two) times daily.    Yes [provider]  propranolol (INDERAL) 10 MG tablet Take 1 tablet (10 mg total) by mouth 3 (three) times daily. 07/05/18  Yes [name], MD  rosuvastatin (CRESTOR) 20 MG tablet Take 1 tablet (20 mg total) by mouth daily. 04/16/18  Yes [name], MD  senna (SENOKOT) 8.6 MG tablet Take 1 tablet by mouth 2 (two) times daily as needed for constipation.   Yes [provider]  ticagrelor (BRILINTA) 60 MG TABS tablet Take 1 tablet (60 mg total) by mouth 2 (two) times daily. 04/22/18  Yes Minna Merritts, MD  allopurinol (ZYLOPRIM) 100 MG tablet TAKE 1 TABLET BY MOUTH DAILY 07/30/18   [name], MD  amLODipine (NORVASC) 10 MG tablet TAKE 1 TABLET BY MOUTH DAILY 07/30/18   [name], MD  amLODipine (NORVASC) 5 MG tablet Take 2 tablets (10 mg total) by mouth daily. Patient not taking: Reported on 07/30/2018 04/30/18   Henreitta Leber, MD  dicyclomine (BENTYL) 10 MG capsule Take 1 capsule (10 mg total) by mouth 4 (four) times daily -  before meals and at bedtime. Patient not taking: Reported on 07/30/2018 03/10/18   [name], MD  diphenoxylate-atropine (LOMOTIL) 2.5-0.025 MG tablet Take 1 tablet by mouth 4 (four) times daily as needed for diarrhea or loose stools. Patient not taking: Reported on 07/12/2018 06/08/18   Gladstone Lighter, MD  fluticasone [facility]) 50 MCG/ACT nasal spray Place 2  sprays into both nostrils daily as needed for rhinitis. Patient not taking: Reported on 07/12/2018 01/20/18   [name], MD  guaiFENesin-dextromethorphan [facility] DM) 100-10 MG/5ML syrup Take 5 mLs by  mouth every 4 (four) hours as needed for cough. Patient not taking: Reported on 06/28/2018 04/30/18   Henreitta Leber, MD  mometasone-formoterol [facility]) 200-5 MCG/ACT AERO Inhale 2 puffs into the lungs 2 (two) times daily. Patient not taking: Reported on 07/30/2018 02/01/18   [name], MD  traMADol (ULTRAM) 50 MG tablet Take 1 tablet (50 mg total) by mouth 2 (two) times daily as needed. Patient not taking: Reported on 07/12/2018 06/30/18   Triplett, Johnette Abraham B, FNP      PHYSICAL EXAMINATION:   VITAL SIGNS: Blood pressure (!) 144/51, pulse 60, temperature 98.8 F (37.1 C), temperature source Oral, resp. rate 18, height _0  (1.702 m), weight 77.8 kg, SpO2 96 %.  GENERAL:  78 y.o.-year-old patient lying in the bed with no acute distress.  EYES: Pupils equal, round, reactive to light and accommodation. No scleral icterus. Extraocular muscles intact.  HEENT: Head atraumatic, normocephalic. Oropharynx and nasopharynx clear.  NECK:  Supple, no jugular venous distention. No thyroid enlargement, no tenderness.  LUNGS: Decreased breath sound on the right side, no wheezing, rales,rhonchi or crepitation. No use of accessory muscles of respiration.  CARDIOVASCULAR: S1, S2 normal. No murmurs, rubs, or gallops.  ABDOMEN: Soft, nontender, nondistended. Bowel sounds present. No organomegaly or mass.  EXTREMITIES: No pedal edema, cyanosis, or clubbing.  NEUROLOGIC: Cranial nerves II through XII are intact. Muscle strength 5/5 in all extremities. Sensation intact. Gait not checked.  PSYCHIATRIC: The patient is alert and oriented x 3.  SKIN: No obvious rash, lesion, or ulcer.   LABORATORY PANEL:   CBC Recent Labs  Lab 07/30/18 0458  WBC 10.8*  10.9*  HGB 11.1*  11.0*  HCT 34.9*   34.9*  PLT 376  366  MCV 89.0  88.8  MCH 28.3  28.0  MCHC 31.8  31.5  RDW 16.2*  16.1*  LYMPHSABS 2.2  MONOABS 1.0  EOSABS 0.1  BASOSABS 0.0   ------------------------------------------------------------------------------------------------------------------  Chemistries  Recent Labs  Lab 07/30/18 0458  NA 143  K 3.1*  CL 108  CO2 28  GLUCOSE 67*  BUN 21  CREATININE 1.27*  CALCIUM 8.9  AST 16  ALT 12  ALKPHOS 113  BILITOT 0.5   ------------------------------------------------------------------------------------------------------------------ estimated creatinine clearance is 39.2 mL/min (A) (by C-G formula based on SCr of 1.27 mg/dL (H)). ------------------------------------------------------------------------------------------------------------------ No results for input(s): TSH, T4TOTAL, T3FREE, THYROIDAB in the last 72 hours.  Invalid input(s): FREET3   Coagulation profile No results for input(s): INR, PROTIME in the last 168 hours. ------------------------------------------------------------------------------------------------------------------- No results for input(s): DDIMER in the last 72 hours. -------------------------------------------------------------------------------------------------------------------  Cardiac Enzymes Recent Labs  Lab 07/30/18 0458  TROPONINI 0.05*   ------------------------------------------------------------------------------------------------------------------ Invalid input(s): POCBNP  ---------------------------------------------------------------------------------------------------------------  Urinalysis    Component Value Date/Time   COLORURINE YELLOW (A) 05/08/2018 1753   APPEARANCEUR HAZY (A) 05/08/2018 1753   APPEARANCEUR Clear 03/28/2014 0153   LABSPEC 1.014 05/08/2018 1753   LABSPEC 1.011 03/28/2014 0153   PHURINE 5.0 05/08/2018 1753   GLUCOSEU 150 (A) 05/08/2018 1753   GLUCOSEU NEGATIVE 10/25/2015 1004    HGBUR NEGATIVE 05/08/2018 1753   BILIRUBINUR NEGATIVE 05/08/2018 1753   BILIRUBINUR Negative 03/28/2014 0153   KETONESUR NEGATIVE 05/08/2018 1753   PROTEINUR 100 (A) 05/08/2018 1753   UROBILINOGEN 0.2 10/25/2015 1004   NITRITE NEGATIVE 05/08/2018 1753   LEUKOCYTESUR NEGATIVE 05/08/2018 1753   LEUKOCYTESUR Negative 03/28/2014 0153     RADIOLOGY: Dg Chest 2 View  Result Date: 07/30/2018 CLINICAL DATA:  Chest pain EXAM: CHEST - 2 VIEW  COMPARISON:  06/07/2018 FINDINGS: Normal heart size and stable mediastinal contours. Dense right lower lobe airspace disease. Right middle lobe airspace disease with upward bowing of the minor fissure. Generalized interstitial coarsening. There may be pleural fluid on the right. No pneumothorax. IMPRESSION: Right middle and lower lobe consolidation. Electronically Signed   By: Monte Fantasia M.D.   On: 07/30/2018 05:18   Ct Chest Wo Contrast  Result Date: 07/30/2018 CLINICAL DATA:  Initial evaluation for back pain. History of COPD, cardiac stent. EXAM: CT CHEST WITHOUT CONTRAST CT THORACIC SPINE WITHOUT CONTRAST TECHNIQUE: Multidetector CT imaging of the chest was performed following the standard protocol without IV contrast. COMPARISON:  Comparison made with prior radiograph from earlier the same day as well as prior chest CT from 06/05/2018. FINDINGS: Cardiovascular: Intrathoracic aorta normal in caliber without aneurysm or visible acute finding on this noncontrast examination. Moderate atherosclerosis. Visualized great vessels normal in caliber without obvious abnormality. Cardiomegaly with 3 vessel coronary artery calcifications. No pericardial effusion. Main pulmonary artery dilated up to 3.1 cm, suggesting chronic underlying pulmonary hypertension, similar to previous. Mediastinum/Nodes: Thyroid normal. Multiple predominantly subcentimeter calcified lymph nodes seen throughout the mediastinum and bilateral hila, compatible with prior granulomatous infection. No  enlarged axillary nodes. Overall, appearance is stable from previous. No new or concerning adenopathy. Trace layering secretions noted within the esophageal lumen. Esophagus otherwise unremarkable. Lungs/Pleura: Tracheobronchial tree intact and patent. Large layering right pleural effusion with associated sub pulmonic component. Small layering left effusion present as well. Associated consolidative opacities present within the right middle and lower lobes. Mild hazy opacities within the posterior left upper and lower lobes favored to reflect atelectasis and/or mild congestion. No pneumothorax. 5 mm right lower lobe nodule stable from previous, likely related to prior granulomatous disease. No other new worrisome pulmonary nodule or mass. Upper Abdomen: Visualized upper abdomen demonstrates no acute finding. Adrenal glands are somewhat diffusely enlarged bilaterally, which could reflect underlying adrenal hyperplasia, stable from previous. Scattered prominent gastrohepatic, porta hepatis, and aortocaval lymph nodes noted, again likely related to prior granulomatous infection. Few punctate calcified granulomas noted within the spleen. Musculoskeletal: External soft tissues demonstrate no acute finding. No acute osseous abnormality. No discrete lytic or blastic osseous lesions. Remotely healed fracture of the left posterolateral sixth rib noted. Visualized external soft tissues demonstrate no acute finding. CT THORACIC SPINE Alignment: Vertebral bodies normally aligned with preservation of the normal thoracic kyphosis. No listhesis or malalignment. Vertebrae: Cervical ACDF partially visualized. Vertebral body heights well maintained without evidence for acute or chronic fracture. No discrete lytic or blastic osseous lesions. Visualized ribs intact and normal. Paraspinal and other soft tissues: Paraspinous soft tissues demonstrate no acute finding. Disc levels: Multilevel degenerative endplate spurring seen throughout  the thoracic spine, most notable within the mid and lower aspect. Mild scattered posterior element hypertrophy, most notable at T10-11. No significant disc pathology. No significant spinal stenosis or significant bony foraminal narrowing. IMPRESSION: CT CHEST IMPRESSION: 1. Large right with small left layering pleural effusions. Associated consolidative opacity involving the right middle and lower lobes felt to be most consistent with compressive atelectasis, although superimposed infiltrate could be considered in the correct clinical setting. 2. Sequelae of chronic granulomatous disease with multiple calcified mediastinal and hilar adenopathy, with calcified granulomata of the spleen. 3. 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 4. Cardiomegaly with diffuse 3 vessel coronary artery calcifications with moderate aortic atherosclerosis. 5. Cardiomegaly  with 3 vessel coronary artery calcifications. 6. Mild thickening of the adrenal glands bilaterally, which could reflect an underlying degree of adrenal hyperplasia. CT THORACIC SPINE IMPRESSION: 1. No fracture or other acute abnormality within the thoracic spine. 2. Moderate chronic endplate degenerative spurring throughout the thoracic spine. No appreciable significant disc pathology or stenosis. 3. Mild multilevel facet hypertrophy, most notable at T10-11, which could contribute to underlying back pain. Electronically Signed   By: Jeannine Boga M.D.   On: 07/30/2018 06:54   Ct T-spine No Charge  Result Date: 07/30/2018 CLINICAL DATA:  Initial evaluation for back pain. History of COPD, cardiac stent. EXAM: CT CHEST WITHOUT CONTRAST CT THORACIC SPINE WITHOUT CONTRAST TECHNIQUE: Multidetector CT imaging of the chest was performed following the standard protocol without IV contrast. COMPARISON:  Comparison made with prior radiograph from earlier the  same day as well as prior chest CT from 06/05/2018. FINDINGS: Cardiovascular: Intrathoracic aorta normal in caliber without aneurysm or visible acute finding on this noncontrast examination. Moderate atherosclerosis. Visualized great vessels normal in caliber without obvious abnormality. Cardiomegaly with 3 vessel coronary artery calcifications. No pericardial effusion. Main pulmonary artery dilated up to 3.1 cm, suggesting chronic underlying pulmonary hypertension, similar to previous. Mediastinum/Nodes: Thyroid normal. Multiple predominantly subcentimeter calcified lymph nodes seen throughout the mediastinum and bilateral hila, compatible with prior granulomatous infection. No enlarged axillary nodes. Overall, appearance is stable from previous. No new or concerning adenopathy. Trace layering secretions noted within the esophageal lumen. Esophagus otherwise unremarkable. Lungs/Pleura: Tracheobronchial tree intact and patent. Large layering right pleural effusion with associated sub pulmonic component. Small layering left effusion present as well. Associated consolidative opacities present within the right middle and lower lobes. Mild hazy opacities within the posterior left upper and lower lobes favored to reflect atelectasis and/or mild congestion. No pneumothorax. 5 mm right lower lobe nodule stable from previous, likely related to prior granulomatous disease. No other new worrisome pulmonary nodule or mass. Upper Abdomen: Visualized upper abdomen demonstrates no acute finding. Adrenal glands are somewhat diffusely enlarged bilaterally, which could reflect underlying adrenal hyperplasia, stable from previous. Scattered prominent gastrohepatic, porta hepatis, and aortocaval lymph nodes noted, again likely related to prior granulomatous infection. Few punctate calcified granulomas noted within the spleen. Musculoskeletal: External soft tissues demonstrate no acute finding. No acute osseous abnormality. No discrete  lytic or blastic osseous lesions. Remotely healed fracture of the left posterolateral sixth rib noted. Visualized external soft tissues demonstrate no acute finding. CT THORACIC SPINE Alignment: Vertebral bodies normally aligned with preservation of the normal thoracic kyphosis. No listhesis or malalignment. Vertebrae: Cervical ACDF partially visualized. Vertebral body heights well maintained without evidence for acute or chronic fracture. No discrete lytic or blastic osseous lesions. Visualized ribs intact and normal. Paraspinal and other soft tissues: Paraspinous soft tissues demonstrate no acute finding. Disc levels: Multilevel degenerative endplate spurring seen throughout the thoracic spine, most notable within the mid and lower aspect. Mild scattered posterior element hypertrophy, most notable at T10-11. No significant disc pathology. No significant spinal stenosis or significant bony foraminal narrowing. IMPRESSION: CT CHEST IMPRESSION: 1. Large right with small left layering pleural effusions. Associated consolidative opacity involving the right middle and lower lobes felt to be most consistent with compressive atelectasis, although superimposed infiltrate could be considered in the correct clinical setting. 2. Sequelae of chronic granulomatous disease with multiple calcified mediastinal and hilar adenopathy, with calcified granulomata of the spleen. 3. Dilatation of the main pulmonary artery up to 3.1 cm, suggesting a degree of underlying pulmonary  hypertension, stable from previous and likely chronic. 4. Cardiomegaly with diffuse 3 vessel coronary artery calcifications with moderate aortic atherosclerosis. 5. Cardiomegaly with 3 vessel coronary artery calcifications. 6. Mild thickening of the adrenal glands bilaterally, which could reflect an underlying degree of adrenal hyperplasia. CT THORACIC SPINE IMPRESSION: 1. No fracture or other acute abnormality within the thoracic spine. 2. Moderate chronic  endplate degenerative spurring throughout the thoracic spine. No appreciable significant disc pathology or stenosis. 3. Mild multilevel facet hypertrophy, most notable at T10-11, which could contribute to underlying back pain. Electronically Signed   By: Jeannine Boga M.D.   On: 07/30/2018 06:54   Dg Chest Port 1 View  Result Date: 07/30/2018 CLINICAL DATA:  Follow-up thoracentesis EXAM: PORTABLE CHEST 1 VIEW COMPARISON:  Earlier same day FINDINGS: There is less pleural fluid on the right. No pneumothorax. Some pleural fluid does persist with basilar volume loss. Left chest remains clear. Pulmonary venous hypertension again noted IMPRESSION: Less pleural fluid on the right following thoracentesis, with less right lung atelectasis. No pneumothorax. Electronically Signed   By: Nelson Chimes M.D.   On: 07/30/2018 12:37   US Thoracentesis Asp Pleural Space W/img Guide  Result Date: 07/30/2018 INDICATION: Right effusion EXAM: ULTRASOUND GUIDED RIGHT THORACENTESIS MEDICATIONS: None. COMPLICATIONS: None immediate. PROCEDURE: An ultrasound guided thoracentesis was thoroughly discussed with the patient and questions answered. The benefits, risks, alternatives and complications were also discussed. The patient understands and wishes to proceed with the procedure. Written consent was obtained. Ultrasound was performed to localize and mark an adequate pocket of fluid in the right chest. The area was then prepped and draped in the normal sterile fashion. 1% Lidocaine was used for local anesthesia. Under ultrasound guidance a Safe-T-Centesis catheter was introduced. Thoracentesis was performed. The catheter was removed and a dressing applied. FINDINGS: A total of approximately 720 mL of clear yellow fluid was removed. Samples were sent to the laboratory as requested by the clinical team. IMPRESSION: Successful ultrasound guided right thoracentesis yielding 720 mL of pleural fluid. No pneumothorax on follow-up  radiograph. Electronically Signed   By: Lucrezia Europe M.D.   On: 07/30/2018 12:49    EKG: Orders placed or performed during the hospital encounter of 07/30/18  . ED EKG  . ED EKG  . EKG 12-Lead  . EKG 12-Lead  . EKG 12-Lead  . EKG 12-Lead   *Note: Due to a large number of results and/or encounters for the requested time period, some results have not been displayed. A complete set of results can be found in Results Review.    IMPRESSION AND PLAN:  *Bilateral pleural effusion with large on the right, due to CHF. IR guided thoracentesis advised. We will collect labs to rule out other pathologies.  *Acute on chronic diastolic congestive heart failure IV Lasix, monitor intake and output, recent echocardiogram is done.  Cardiology consult is called in.  *Coronary artery disease status post stent in the past Continue aspirin, Brilinta, statin, beta-blocker.  *Essential hypertension Continue home medications.  *Hypokalemia Replace with IV Lasix use.  *Diabetes mellitus type 2 Continue home dose of insulin and keep on sliding scale coverage.  *Stage III chronic kidney disease Follow renal function with diuretics.  All the records are reviewed and case discussed with ED provider. Management plans discussed with the patient, family and they are in agreement.  CODE STATUS: Full.    Code Status Orders  (From admission, onward)         Start  Ordered   07/30/18 1017  Full code  Continuous     07/30/18 1016        Code Status History    Date Active Date Inactive Code Status Order ID Comments User Context   06/05/2018 2309 06/08/2018 2211 Full Code [id number]  Lance Coon, MD Inpatient   04/25/2018 0647 04/30/2018 2057 Full Code [id number]  Harrie Foreman, MD Inpatient   06/04/2017 0151 06/04/2017 1944 Full Code [id number]  Lance Coon, MD Inpatient   05/24/2017 0129 05/26/2017 1926 Full Code [id number]  Lance Coon, MD Inpatient   11/14/2016 2249 11/15/2016 [place] Full Code  [id number]  Octa, [employer], DO ED   10/10/2016 1325 10/13/2016 2020 Full Code [id number]  Earnie Larsson, MD Inpatient   06/11/2016 2142 06/12/2016 1923 Full Code [id number]  Tracy, [place], DO ED   01/02/2016 0524 01/02/2016 0914 Full Code [id number]  Saundra Shelling, MD Inpatient   06/26/2015 0736 06/29/2015 1954 Full Code [id number]  Demetrios Loll, MD ED   04/20/2015 0118 04/22/2015 1918 Full Code [id number]  Lance Coon, MD Inpatient       TOTAL TIME TAKING CARE OF THIS PATIENT: 45 minutes.    Vaughan Basta M.D on 07/30/2018   Between 7am to 6pm - Pager - [phone]  After 6pm go to [email or web address] - password [email or web address] [facility]  Office  [phone]  CC: Primary care physician; [name], MD   Note: This dictation was prepared with Dragon dictation along with smaller phrase technology. Any transcriptional errors that result from this process are unintentional.

## 2018-07-30 NOTE — Progress Notes (Signed)
Inpatient Diabetes Program Recommendations  AACE/ADA: New Consensus Statement on Inpatient Glycemic Control (2015)  Target Ranges:  Prepandial:   less than 140 mg/dL      Peak postprandial:   less than 180 mg/dL (1-2 hours)      Critically ill patients:  140 - 180 mg/dL   Lab Results  Component Value Date   GLUCAP 204 (H) 06/08/2018   HGBA1C 7.7 (H) 02/10/2018    Review of Glycemic Control Results for KEALY, LEWTER (MRN [id number]) as of 07/30/2018 15:17  Ref. Range 07/30/2018 04:58  Glucose Latest Ref Range: 70 - 99 mg/dL 67 (L)   Diabetes history: DM 2 Outpatient Diabetes medications:  Humalog 75/25 30 units with breakfast and 20 units with supper (to hold if patient does not eat) Current orders for Inpatient glycemic control:  Novolog 70/30 mix 20 units bid  Inpatient Diabetes Program Recommendations:    Please add Novolog sensitive correction tid with meals and HS.  Also may need reduction in 70/30 while in the hospital? She last saw her PCP Dr Derrel Nip on 07/12/18.  MD reviewed medications with husband as patient has hx. Of lows when she had taken insulin but not eaten.  Secure text sent to MD. Will alert RN as well regarding need for orders for CBG's and Novolog correction.   Thanks Adah Perl, RN, BC-ADM Inpatient Diabetes Coordinator Pager [phone] (8a-5p)

## 2018-07-30 NOTE — Progress Notes (Signed)
Family Meeting Note  Advance Directive:no  Today a meeting took place with the Patient.  The following clinical team members were present during this meeting:MD  The following were discussed:Patient's diagnosis: CHF, coronary artery disease, recurrent pleural effusion, diabetes, hypertension, Patient's progosis: Unable to determine and Goals for treatment: Full Code  Additional follow-up to be provided: cardiology  Time spent during discussion:20 minutes  Vaughan Basta, MD

## 2018-07-30 NOTE — Patient Outreach (Signed)
   This encounter was created in error - please disregard.  [name], RN, [facility] Management Coordinator  [phone]- Mobile [phone]- Toll Free Main Office

## 2018-07-30 NOTE — ED Triage Notes (Signed)
Pt arrived via [facility] from home with c/o back pain and "pitter-patter in my chest." EMS states that pt was throwing some PVCs en route but there is no pain in her chest at this time. EMS states pt has hx of diabetes and BS was 81.

## 2018-07-30 NOTE — ED Notes (Addendum)
ED TO INPATIENT HANDOFF REPORT  ED Nurse Name and Phone #: Latravis Grine, RN [phone]  S Name/Age/Gender Pamela Collier 78 y.o. female Room/Bed: ED04A/ED04A  Code Status   Code Status: Prior  Home/SNF/Other Home Patient oriented to: self, place, time and situation Is this baseline? Yes   Triage Complete: Triage complete  Chief Complaint Chest Pain  Triage Note Pt arrived via [facility] from home with c/o back pain and "pitter-patter in my chest." EMS states that pt was throwing some PVCs en route but there is no pain in her chest at this time. EMS states pt has hx of diabetes and BS was 81.    Allergies Allergies  Allergen Reactions  . Cephalexin Itching  . Contrast Media [Iodinated Diagnostic Agents] Itching  . Doxycycline Itching  . Gabapentin Itching  . Sulfa Antibiotics Itching    Level of Care/Admitting Diagnosis ED Disposition    ED Disposition Condition [facility]: [place] [100120]  Level of Care: Telemetry [5]  Diagnosis: Pleural effusion [233007]  Admitting Physician: Vaughan Basta [[id number]]  Attending Physician: Vaughan Basta [phone]  Estimated length of stay: past midnight tomorrow  Certification:: I certify this patient will need inpatient services for at least 2 midnights  PT Class (Do Not Modify): Inpatient [101]  PT Acc Code (Do Not Modify): Private [1]       B Medical/Surgery History Past Medical History:  Diagnosis Date  . Acute respiratory failure ([place]) 11/21/2014  . Anxiety   . Arthritis 08/05/2013  . Atherosclerotic peripheral vascular disease with gangrene [facility]) august 2012  . Cardiomyopathy, ischemic    a. EF 35 to 40% by echo in 2013 b. EF improved to 50-55% by echo in 04/2015; 11/2016 Echo: EF 55-60%, Gr1 DD, mildly dil LA, nl RV fxn.  . Cerebral infarct ([place]) 08/17/2013  . Chronic diastolic CHF (congestive heart failure) (H[facility])    a. EF 50-55% by echo in 04/2015; b. 11/2016 Echo: EF  55-60%, Gr1 DD.  [name] COPD (chronic obstructive pulmonary disease) ([place])   . Coronary artery disease, occlusive    a. Previous PCI to the LAD, LCx, and RCA in 2010, 2011, 2013, and 2016. All performed in [place]; b. 11/2016 Lexiscan MV: EF 43%, no ischemia->low risk.  . 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 Diabetic diarrhea ([place]) 10/03/2014  . DM type 2, uncontrolled, with renal complications ([place]) 07/18/9371  . Hepatic steatosis    by CT abd pelvis  . History of kidney stones    at a younger age  . Hyperlipidemia LDL goal <100 02/23/2014  . Hypertensive heart disease   . Osteoporosis, post-menopausal   . 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 Pill esophagitis   . Pleural effusion 10/25/2012   10/2012 CT chest >> small to moderate R lung effusion>> chylothorax, 100% lymphs 10/2013 thoracentesis> cytology negative, WBC 1471, > 90% "small lymphs" 01/2014 CT chest> near complete resolution of pleural effusion, stable lymphadenopathy 08/2014 CT chest [place] ([facility]): small right sided effusion decreased in size, stable mediastinal lymphadenopathy 1.0cm largest, 22m  . 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 Sleep  apnea   . Stage III chronic kidney disease [facility])    Past Surgical History:  Procedure Laterality Date  . ABDOMINAL HYSTERECTOMY     at ge 39. secondary to bleeding/partial  . ANTERIOR CERVICAL DECOMP/DISCECTOMY FUSION N/A 10/10/2016   Procedure: Anterior Cervical Discectomy Fusion - Cervical four4- five - Cervical five-Cervical six - Cervical six-Cervical seven;  Surgeon: Earnie Larsson, MD;  Location: [place];  Service: Neurosurgery;  Laterality: N/A;  . BREAST CYST  ASPIRATION Right   . CARPAL TUNNEL RELEASE Bilateral   . CHOLECYSTECTOMY     in [place]   . COLONOSCOPY WITH PROPOFOL N/A 01/09/2015   Procedure: COLONOSCOPY WITH PROPOFOL;  Surgeon: Lucilla Lame, MD;  Location: [facility] ENDOSCOPY;  Service: Endoscopy;  Laterality: N/A;  . CORONARY ANGIOPLASTY WITH STENT PLACEMENT     [place]; [facility]  . EYE SURGERY     bil cataracts  . HERNIA REPAIR     umbilical/ Dr Pat Patrick  . OOPHORECTOMY    . PTCA  August 2012   Right Posterior tibial artery , [place]  . TOE AMPUTATION  Sept 2012   Right 2nd toe, [place]     A IV Location/Drains/Wounds Patient Lines/Drains/Airways Status   Active Line/Drains/Airways    Name:   Placement date:   Placement time:   Site:   Days:   Peripheral IV 07/30/18 Right Forearm   07/30/18    -    Forearm   less than 1          Intake/Output Last 24 hours  Intake/Output Summary (Last 24 hours) at 07/30/2018 0923 Last data filed at 07/30/2018 0829 Gross per 24 hour  Intake -  Output 675 ml  Net -675 ml    Labs/Imaging Results for orders placed or performed during the hospital encounter of 07/30/18 (from the past 48 hour(s))  Basic metabolic panel     Status: Abnormal   Collection Time: 07/30/18  4:58 AM  Result Value Ref Range   Sodium 143 135 - 145 mmol/L   Potassium 3.1 (L) 3.5 - 5.1 mmol/L   Chloride 108 98 - 111 mmol/L   CO2 28 22 - 32 mmol/L   Glucose, Bld 67 (L) 70 - 99 mg/dL   BUN 21 8 - 23 mg/dL   Creatinine, Ser 1.27 (H) 0.44 - 1.00 mg/dL   Calcium 8.9 8.9 - 10.3 mg/dL   GFR calc non Af Amer 40 (L) >60 mL/min   GFR calc Af Amer 47 (L) >60 mL/min   Anion gap 7 5 - 15    Comment: Performed at [facility], [address] [place], [place] [address]  CBC     Status: Abnormal   Collection Time: 07/30/18  4:58 AM  Result Value Ref Range   WBC 10.9 (H) 4.0 - 10.5 K/uL   RBC 3.93 3.87 - 5.11 MIL/uL   Hemoglobin 11.0 (L) 12.0 - 15.0 g/dL   HCT 34.9 (L) 36.0 - 46.0 %   MCV  88.8 80.0 - 100.0 fL   MCH 28.0 26.0 - 34.0 pg   MCHC 31.5 30.0 - 36.0 g/dL   RDW 16.1 (H) 11.5 - 15.5 %   Platelets 366 150 - 400 K/uL   nRBC 0.0 0.0 - 0.2 %    Comment: Performed at [facility], [address] [place], [place] [address]  Troponin I - ONCE - STAT     Status: Abnormal   Collection Time: 07/30/18  4:58 AM  Result Value Ref Range   Troponin I 0.05 (HH) <0.03 ng/mL    Comment: CRITICAL RESULT CALLED TO, READ BACK BY AND VERIFIED WITH [place] AMORIELLO AT [phone] 07/30/2018 SMA Performed at [facility], [address] [place], [place] [address]   Hepatic function panel     Status: Abnormal   Collection Time: 07/30/18  4:58 AM  Result Value Ref Range   Total Protein 6.8 6.5 - 8.1 g/dL   Albumin 3.3 (L) 3.5 - 5.0 g/dL   AST 16 15 - 41 U/L   ALT 12 0 - 44 U/L   Alkaline Phosphatase 113 38 - 126 U/L   Total Bilirubin 0.5 0.3 - 1.2 mg/dL   Bilirubin, Direct 0.1 0.0 - 0.2 mg/dL   Indirect Bilirubin 0.4 0.3 - 0.9 mg/dL    Comment: Performed at [facility], [address] [place], [place] [address]  Lipase, blood     Status: Abnormal   Collection Time: 07/30/18  4:58 AM  Result Value Ref Range   Lipase 59 (H) 11 - 51 U/L    Comment: Performed at [facility], [address] [place], [place] [address]  Brain natriuretic peptide     Status: Abnormal   Collection Time: 07/30/18  4:58 AM  Result Value Ref Range   B Natriuretic Peptide 391.0 (H) 0.0 - 100.0 pg/mL    Comment: Performed at [facility], [address] [place], [place] [address]  CBC with Differential/Platelet     Status: Abnormal   Collection Time: 07/30/18  4:58 AM  Result Value Ref Range   WBC 10.8 (H) 4.0 - 10.5 K/uL   RBC 3.92 3.87 - 5.11 MIL/uL   Hemoglobin 11.1 (L) 12.0 - 15.0 g/dL   HCT 34.9 (L) 36.0 - 46.0 %   MCV 89.0 80.0 - 100.0 fL   MCH 28.3 26.0 - 34.0 pg   MCHC 31.8 30.0 - 36.0 g/dL   RDW 16.2 (H) 11.5 - 15.5 %   Platelets 376 150 - 400  K/uL   nRBC 0.0 0.0 - 0.2 %   Neutrophils Relative % 68 %   Neutro Abs 7.4 1.7 - 7.7 K/uL   Lymphocytes Relative 20 %   Lymphs Abs 2.2 0.7 - 4.0 K/uL   Monocytes Relative 10 %   Monocytes Absolute 1.0 0.1 - 1.0 K/uL   Eosinophils Relative 1 %   Eosinophils Absolute 0.1 0.0 - 0.5 K/uL   Basophils Relative 0 %   Basophils Absolute 0.0 0.0 - 0.1 K/uL   Immature Granulocytes 1 %   Abs Immature Granulocytes 0.11 (H) 0.00 - 0.07 K/uL    Comment: Performed at [facility], [address], [place],  [address]   *Note: Due to a large number of results and/or encounters for the requested time period, some results have not been displayed. A complete set of results can be found in Results Review.   Dg Chest 2 View  Result Date: 07/30/2018 CLINICAL DATA:  Chest pain EXAM: CHEST - 2 VIEW COMPARISON:  06/07/2018 FINDINGS: Normal heart size and stable mediastinal contours. Dense right lower lobe airspace disease. Right middle lobe airspace disease with upward bowing of the minor fissure. Generalized interstitial coarsening. There may be pleural fluid on the right. No pneumothorax. IMPRESSION: Right middle and lower lobe consolidation. Electronically Signed   By: Monte Fantasia M.D.   On: 07/30/2018 05:18   Ct Chest Wo Contrast  Result Date: 07/30/2018 CLINICAL DATA:  Initial evaluation  for back pain. History of COPD, cardiac stent. EXAM: CT CHEST WITHOUT CONTRAST CT THORACIC SPINE WITHOUT CONTRAST TECHNIQUE: Multidetector CT imaging of the chest was performed following the standard protocol without IV contrast. COMPARISON:  Comparison made with prior radiograph from earlier the same day as well as prior chest CT from 06/05/2018. FINDINGS: Cardiovascular: Intrathoracic aorta normal in caliber without aneurysm or visible acute finding on this noncontrast examination. Moderate atherosclerosis. Visualized great vessels normal in caliber without obvious abnormality. Cardiomegaly with 3 vessel  coronary artery calcifications. No pericardial effusion. Main pulmonary artery dilated up to 3.1 cm, suggesting chronic underlying pulmonary hypertension, similar to previous. Mediastinum/Nodes: Thyroid normal. Multiple predominantly subcentimeter calcified lymph nodes seen throughout the mediastinum and bilateral hila, compatible with prior granulomatous infection. No enlarged axillary nodes. Overall, appearance is stable from previous. No new or concerning adenopathy. Trace layering secretions noted within the esophageal lumen. Esophagus otherwise unremarkable. Lungs/Pleura: Tracheobronchial tree intact and patent. Large layering right pleural effusion with associated sub pulmonic component. Small layering left effusion present as well. Associated consolidative opacities present within the right middle and lower lobes. Mild hazy opacities within the posterior left upper and lower lobes favored to reflect atelectasis and/or mild congestion. No pneumothorax. 5 mm right lower lobe nodule stable from previous, likely related to prior granulomatous disease. No other new worrisome pulmonary nodule or mass. Upper Abdomen: Visualized upper abdomen demonstrates no acute finding. Adrenal glands are somewhat diffusely enlarged bilaterally, which could reflect underlying adrenal hyperplasia, stable from previous. Scattered prominent gastrohepatic, porta hepatis, and aortocaval lymph nodes noted, again likely related to prior granulomatous infection. Few punctate calcified granulomas noted within the spleen. Musculoskeletal: External soft tissues demonstrate no acute finding. No acute osseous abnormality. No discrete lytic or blastic osseous lesions. Remotely healed fracture of the left posterolateral sixth rib noted. Visualized external soft tissues demonstrate no acute finding. CT THORACIC SPINE Alignment: Vertebral bodies normally aligned with preservation of the normal thoracic kyphosis. No listhesis or malalignment.  Vertebrae: Cervical ACDF partially visualized. Vertebral body heights well maintained without evidence for acute or chronic fracture. No discrete lytic or blastic osseous lesions. Visualized ribs intact and normal. Paraspinal and other soft tissues: Paraspinous soft tissues demonstrate no acute finding. Disc levels: Multilevel degenerative endplate spurring seen throughout the thoracic spine, most notable within the mid and lower aspect. Mild scattered posterior element hypertrophy, most notable at T10-11. No significant disc pathology. No significant spinal stenosis or significant bony foraminal narrowing. IMPRESSION: CT CHEST IMPRESSION: 1. Large right with small left layering pleural effusions. Associated consolidative opacity involving the right middle and lower lobes felt to be most consistent with compressive atelectasis, although superimposed infiltrate could be considered in the correct clinical setting. 2. Sequelae of chronic granulomatous disease with multiple calcified mediastinal and hilar adenopathy, with calcified granulomata of the spleen. 3. 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 4. Cardiomegaly with diffuse 3 vessel coronary artery calcifications with moderate aortic atherosclerosis. 5. Cardiomegaly with 3 vessel coronary artery calcifications. 6. Mild thickening of the adrenal glands bilaterally, which could reflect an underlying degree of adrenal hyperplasia. CT THORACIC SPINE IMPRESSION: 1. No fracture or other acute abnormality within the thoracic spine. 2. Moderate chronic endplate degenerative spurring throughout the thoracic spine. No appreciable significant disc pathology or stenosis. 3. Mild multilevel facet hypertrophy, most notable at T10-11, which could contribute to underlying back pain. Electronically Signed   By: [name]  Jeannine Boga M.D.   On: 07/30/2018 06:54   Ct  T-spine No Charge  Result Date: 07/30/2018 CLINICAL DATA:  Initial evaluation for back pain. History of COPD, cardiac stent. EXAM: CT CHEST WITHOUT CONTRAST CT THORACIC SPINE WITHOUT CONTRAST TECHNIQUE: Multidetector CT imaging of the chest was performed following the standard protocol without IV contrast. COMPARISON:  Comparison made with prior radiograph from earlier the same day as well as prior chest CT from 06/05/2018. FINDINGS: Cardiovascular: Intrathoracic aorta normal in caliber without aneurysm or visible acute finding on this noncontrast examination. Moderate atherosclerosis. Visualized great vessels normal in caliber without obvious abnormality. Cardiomegaly with 3 vessel coronary artery calcifications. No pericardial effusion. Main pulmonary artery dilated up to 3.1 cm, suggesting chronic underlying pulmonary hypertension, similar to previous. Mediastinum/Nodes: Thyroid normal. Multiple predominantly subcentimeter calcified lymph nodes seen throughout the mediastinum and bilateral hila, compatible with prior granulomatous infection. No enlarged axillary nodes. Overall, appearance is stable from previous. No new or concerning adenopathy. Trace layering secretions noted within the esophageal lumen. Esophagus otherwise unremarkable. Lungs/Pleura: Tracheobronchial tree intact and patent. Large layering right pleural effusion with associated sub pulmonic component. Small layering left effusion present as well. Associated consolidative opacities present within the right middle and lower lobes. Mild hazy opacities within the posterior left upper and lower lobes favored to reflect atelectasis and/or mild congestion. No pneumothorax. 5 mm right lower lobe nodule stable from previous, likely related to prior granulomatous disease. No other new worrisome pulmonary nodule or mass. Upper Abdomen: Visualized upper abdomen demonstrates no acute finding. Adrenal glands are somewhat diffusely enlarged bilaterally,  which could reflect underlying adrenal hyperplasia, stable from previous. Scattered prominent gastrohepatic, porta hepatis, and aortocaval lymph nodes noted, again likely related to prior granulomatous infection. Few punctate calcified granulomas noted within the spleen. Musculoskeletal: External soft tissues demonstrate no acute finding. No acute osseous abnormality. No discrete lytic or blastic osseous lesions. Remotely healed fracture of the left posterolateral sixth rib noted. Visualized external soft tissues demonstrate no acute finding. CT THORACIC SPINE Alignment: Vertebral bodies normally aligned with preservation of the normal thoracic kyphosis. No listhesis or malalignment. Vertebrae: Cervical ACDF partially visualized. Vertebral body heights well maintained without evidence for acute or chronic fracture. No discrete lytic or blastic osseous lesions. Visualized ribs intact and normal. Paraspinal and other soft tissues: Paraspinous soft tissues demonstrate no acute finding. Disc levels: Multilevel degenerative endplate spurring seen throughout the thoracic spine, most notable within the mid and lower aspect. Mild scattered posterior element hypertrophy, most notable at T10-11. No significant disc pathology. No significant spinal stenosis or significant bony foraminal narrowing. IMPRESSION: CT CHEST IMPRESSION: 1. Large right with small left layering pleural effusions. Associated consolidative opacity involving the right middle and lower lobes felt to be most consistent with compressive atelectasis, although superimposed infiltrate could be considered in the correct clinical setting. 2. Sequelae of chronic granulomatous disease with multiple calcified mediastinal and hilar adenopathy, with calcified granulomata of the spleen. 3. 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 4. Cardiomegaly with diffuse  3 vessel coronary artery calcifications with moderate aortic atherosclerosis. 5. Cardiomegaly with 3 vessel coronary artery calcifications. 6. Mild thickening of the adrenal glands bilaterally, which could reflect an underlying degree of adrenal hyperplasia. CT THORACIC SPINE IMPRESSION: 1. No fracture or other acute abnormality within the thoracic spine. 2. Moderate chronic endplate degenerative spurring throughout the thoracic spine. No appreciable significant disc pathology or stenosis.  3. Mild multilevel facet hypertrophy, most notable at T10-11, which could contribute to underlying back pain. Electronically Signed   By: Jeannine Boga M.D.   On: 07/30/2018 06:54    Pending Labs Unresulted Labs (From admission, onward)    Start     Ordered   07/30/18 0534  Differential  Add-on,   AD     07/30/18 0533   Signed and Held  Basic metabolic panel  Tomorrow morning,   R     Signed and Held   Signed and Held  CBC  Tomorrow morning,   R     Signed and Held   Signed and Held  CBC  (heparin)  Once,   R    Comments:  Baseline for heparin therapy IF NOT ALREADY DRAWN.  Notify MD if PLT < 100 K.    Signed and Held   Signed and Held  Creatinine, serum  (heparin)  Once,   R    Comments:  Baseline for heparin therapy IF NOT ALREADY DRAWN.    Signed and Held          Vitals/Pain Today's Vitals   07/30/18 0830 07/30/18 0845 07/30/18 0900 07/30/18 0908  BP: (!) 183/63  (!) 165/61   Pulse: 63 (!) 58 (!) 53   Resp: 20 19 15    Temp:      TempSrc:      SpO2: 96% 97% 96%   Weight:      Height:      PainSc:    0-No pain    Isolation Precautions No active isolations  Medications Medications  furosemide (LASIX) injection 20 mg (has no administration in time range)  furosemide (LASIX) injection 20 mg (20 mg Intravenous Given 07/30/18 0747)    Mobility walks with device Moderate fall risk   Focused Assessments Cardiac Assessment Handoff:  Cardiac Rhythm: Sinus bradycardia Lab  Results  Component Value Date   CKTOTAL 84 05/16/2016   CKMB 1.6 05/16/2016   TROPONINI 0.05 (HH) 07/30/2018   No results found for: DDIMER Does the Patient currently have chest pain? No  , Pulmonary Assessment Handoff:  Lung sounds:   O2 Device: Room Air        R Recommendations: See Admitting Provider Note  Report given to:  Lurlean Horns, RN   Additional Notes:  Pt ambulates with walker at home. RN was able to get her up to the restroom with 1 assist.

## 2018-07-30 NOTE — Progress Notes (Signed)
Pt wears CPAP at home, no orders for this here, MD paged. Dr. Jannifer Franklin placed orders for CPAP at night. Conley Simmonds, RN, BSN

## 2018-07-30 NOTE — Consult Note (Signed)
EDICATION-RELATED CONSULT NOTE   IR Procedure Consult - Anticoagulant/Antiplatelet PTA/Inpatient Med List Review by Pharmacist    Procedure: US THORACENTESIS ASP PLEURAL SPACE W/IMG GUIDE     Completed: 1157am  Post-Procedural bleeding risk per IR MD assessment:    Antithrombotic medications on inpatient or PTA profile prior to procedure:   subQ heparin    Recommended restart time per IR Post-Procedure Guidelines:  Skip 1400 dose and resume at 2200 3/20

## 2018-07-30 NOTE — Procedures (Signed)
  Procedure: R thoracentesis   EBL:   minimal Complications:  none immediate  See full dictation in [facility].  Dillard Cannon MD Main # [phone] Pager  [phone]

## 2018-07-30 NOTE — TOC Initial Note (Signed)
Transition of Care [facility]) - Initial/Assessment Note    Patient Details  [name] MRN: [id number] Date of Birth: 10-06-1940  Transition of Care [facility]) CM/SW Contact:    Elza Rafter, RN Phone Number: 07/30/2018, 2:47 PM  Clinical Narrative:      Patient is from home with Husband.  Admitted with SOB and swelling.   Acute on chronic CHF.  Currently on RA.  Uses a walker as needed.  Denies difficulties obtaining medications or with accessing healthcare.  She has a functioning scale at home and recently has not been weighting.  Patient verbalizes importance of daily weights.  Has a heart failure clinic appointment.  Open to [facility] for OT, PT and side.  Would benefit from [facility] RN as well with CHF protocol.  Notified [place] with [facility] that patient has been admitted.               Expected Discharge Plan: [place] Barriers to Discharge: Continued Medical Work up   Patient Goals and CMS Choice Patient states their goals for this hospitalization and ongoing recovery are:: DC to home with husaband with [facility] CMS Medicare.gov Compare Post Acute Care list provided to:: Patient Choice offered to / list presented to : Patient  Expected Discharge Plan and Services Expected Discharge Plan: [place]   Discharge Planning Services: CM Consult, HF Clinic Post Acute Care Choice: [place] arrangements for the past 2 months: Single Family Home                     HH Arranged: RN, PT, OT, Nurse's Aide [place] Agency: [facility] [place]  Prior Living Arrangements/Services Living arrangements for the past 2 months: Single Family Home Lives with:: Spouse   Do you feel safe going back to the place where you live?: Yes        Care giver support system in place?: Yes (comment)(Spouse ) Current home services: Home OT, Home PT, Homehealth aide    Activities of Daily Living   ADL Screening (condition at time of admission) Patient's  cognitive ability adequate to safely complete daily activities?: Yes Is the patient deaf or have difficulty hearing?: No Does the patient have difficulty seeing, even when wearing glasses/contacts?: No Does the patient have difficulty concentrating, remembering, or making decisions?: No Patient able to express need for assistance with ADLs?: Yes Does the patient have difficulty dressing or bathing?: Yes Independently performs ADLs?: No Communication: Independent Dressing (OT): Needs assistance Is this a change from baseline?: Pre-admission baseline Grooming: Needs assistance Is this a change from baseline?: Pre-admission baseline Feeding: Independent Bathing: Needs assistance Is this a change from baseline?: Pre-admission baseline Toileting: Needs assistance Is this a change from baseline?: Pre-admission baseline In/Out Bed: Needs assistance Is this a change from baseline?: Pre-admission baseline Walks in Home: Needs assistance Is this a change from baseline?: Pre-admission baseline Does the patient have difficulty walking or climbing stairs?: Yes Weakness of Legs: Both Weakness of Arms/Hands: Both  Permission Sought/Granted Permission sought to share information with : Facility Art therapist granted to share information with : Yes, Verbal Permission Granted  Share Information with NAME: [place] with [facility]           Emotional Assessment Appearance:: Appears stated age Attitude/Demeanor/Rapport: Self-Confident Affect (typically observed): Accepting Orientation: : Oriented to Self, Oriented to Place, Oriented to  Time, Oriented to Situation Alcohol / Substance Use: Not Applicable    Admission diagnosis:  Orthopnea [R06.01] Back pain [M54.9] CHF (congestive heart failure) (HCC) [I50.9] Pleural effusion, right [J90] Acute on chronic congestive heart failure, unspecified heart failure type [facility]) [I50.9] Patient Active Problem List   Diagnosis Date  Noted  . Pleural effusion 07/30/2018  . Vascular dementia without behavioral disturbance ([place]) 07/13/2018  . HCAP (healthcare-associated pneumonia) 06/05/2018  . Decubitus ulcer of sacral region, stage 1 06/02/2018  . Lymphedema 05/25/2018  . Acute on chronic diastolic heart failure ([place]) 04/25/2018  . Atherosclerosis of both carotid arteries 04/18/2018  . Near syncope 03/19/2018  . Functional diarrhea 03/13/2018  . Vitamin a deficiency, unspecified 02/22/2018  . Hypercalcemia 02/20/2018  . Environmental and seasonal allergies 01/15/2018  . Post-nasal drip 01/15/2018  . Restless legs 10/17/2017  . Normocytic anemia, not due to blood loss 09/27/2017  . Melena 09/25/2017  . Cognitive decline 08/16/2017  . Polypharmacy 07/28/2017  . CKD (chronic kidney disease) stage 3, GFR 30-59 ml/Collier (HCC) 11/15/2016  . History of fall 11/12/2016  . Osteoarthritis 11/10/2016  . Cervical stenosis of spinal canal 10/10/2016  . Cervical radiculopathy at C7 07/02/2016  . Epigastric hernia 04/01/2016  . Coronary artery disease   . Type 2 DM with CKD stage 3 and hypertension ([place])   . Chronic renal insufficiency   . Hypertensive heart disease   . OSA on CPAP 07/07/2015  . Hospital discharge follow-up 07/07/2015  . Pleural effusion due to CHF (congestive heart failure) ([place])   . Chronic diastolic CHF (congestive heart failure) ([place]) 04/18/2015  . Diabetic neuropathy ([place]) 02/21/2015  . Benign neoplasm of sigmoid colon   . Low back pain with radiation 04/04/2014  . Essential hypertension 04/04/2014  . Hyperlipidemia LDL goal <70 02/23/2014  . Long-term use of high-risk medication 02/23/2014  . Other long term (current) drug therapy 02/23/2014  . Dermatophytic onychia 08/17/2013  . Stable angina ([place]) 08/17/2013  . Cerebral infarct ([place]) 08/17/2013  . Bony exostosis 08/17/2013  . Obesity, unspecified 08/17/2013  . Arthritis 08/05/2013  . Unsteadiness on feet 03/24/2013  . History of colonic  polyps 02/20/2013  . Sarcoidosis ([place]) 12/07/2012  . Dysphagia 12/07/2012  . Pulmonary nodule seen on imaging study 05/20/2012  . 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 Diabetic nephropathy associated with type 2 diabetes mellitus ([place]) 02/29/2012  . Cardiomyopathy, ischemic   . Hepatic steatosis   . Osteoporosis, post-menopausal   . Peripheral vascular disease due to secondary diabetes mellitus ([place])   . Double vessel coronary artery disease 12/14/2011   PCP:  [name], MD Pharmacy:   [place], [place] - [address] [place] [address] Phone: [phone] Fax: [phone]     Social Determinants of Health (SDOH) Interventions    Readmission Risk Interventions No flowsheet data found.

## 2018-07-30 NOTE — Consult Note (Signed)
° °Cardiology Consult  °  °Patient ID: Pamela Collier °MRN: [id number], DOB/AGE: 05/26/1940  ° °Admit date: 07/30/2018 °Date of Consult: 07/30/2018 ° °Primary Physician: Tullo, Teresa L, MD °Primary Cardiologist: Timothy Gollan, MD °Requesting Provider: V. Vachhani, MD ° °Patient Profile  °  °Pamela Collier is a 77 y.o. female with a history of CAD, HTN, HL, DMII, CKD III, pulmonary sarcoidosis, PVD, depression, OSA, and prior stroke, who is being seen today for the evaluation of palpitations at the request of Dr. Vachhani. 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 05/2018 RICA 40-59, LICA 40-50.  °• Cerebral infarct (HCC) 08/17/2013  °• Chronic diastolic CHF (congestive heart failure) (HCC)   ° a. EF 50-55% by echo in 04/2015; b. 11/2016 Echo: EF 55-60%, Gr1 DD; c. 05/2018 Echo: EF 60-65%, no rwma.  °• COPD (chronic obstructive pulmonary disease) (HCC)   °• Coronary artery disease, occlusive   ° a. Previous PCI to the LAD, LCx, and RCA in 2010, 2011, 2013, and 2016. All performed in [place]; b. 11/2016 Lexiscan MV: EF 43%, no ischemia->low risk.  °• Depression with anxiety 04/03/2012  °• Diabetic diarrhea (HCC) 10/03/2014  °• DM type 2, uncontrolled, with renal complications (HCC) 12/14/2011  °• Hepatic steatosis   ° by CT abd pelvis  °• History of Ischemic Cardiomyopathy   ° a. EF 35 to 40% by echo in 2013 but subsequently normalized.  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  °• Pill esophagitis   °• Pleural effusion 10/25/2012  ° 10/2012 CT chest >> small to  moderate R lung effusion>> chylothorax, 100% lymphs 10/2013 thoracentesis> cytology negative, WBC 1471, > 90% "small lymphs" 01/2014 CT chest> near complete resolution of pleural effusion, stable lymphadenopathy 08/2014 CT chest [place] ([facility]): small right sided effusion decreased in size, stable mediastinal lymphadenopathy 1.0cm largest, 2m  °• 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 05/2018 s/p thoracentesis -->750ml.  °• Sleep apnea   °• Stage III chronic kidney disease (HCC)   °  °Past Surgical History:  °Procedure Laterality Date  °• ABDOMINAL HYSTERECTOMY    ° at ge 39. secondary to bleeding/partial  °• ANTERIOR CERVICAL DECOMP/DISCECTOMY FUSION N/A 10/10/2016  ° Procedure: Anterior Cervical Discectomy Fusion - Cervical four4- five - Cervical five-Cervical six - Cervical six-Cervical seven;  Surgeon: Pool, Henry, MD;  Location: [facility] OR;  Service: Neurosurgery;  Laterality: N/A;  °• BREAST CYST ASPIRATION Right   °• CARPAL TUNNEL RELEASE Bilateral   °• CHOLECYSTECTOMY    ° in [place]   °• COLONOSCOPY WITH PROPOFOL N/A 01/09/2015  ° Procedure: COLONOSCOPY WITH PROPOFOL;  Surgeon: Darren Wohl, MD;  Location: [facility] ENDOSCOPY;  Service:   Endoscopy;  Laterality: N/A;  °• CORONARY ANGIOPLASTY WITH STENT PLACEMENT    ° [place]; [facility]  °• EYE SURGERY    ° bil cataracts  °• HERNIA REPAIR    ° umbilical/ Dr Ely  °• OOPHORECTOMY    °• PTCA  August 2012  ° Right Posterior tibial artery , [place]  °• TOE AMPUTATION  Sept 2012  ° Right 2nd toe, [place]  °  ° °Allergies ° °Allergies  °Allergen Reactions  °• Cephalexin Itching  °• Contrast Media [Iodinated Diagnostic Agents] Itching  °• Doxycycline Itching  °•  Gabapentin Itching  °• Sulfa Antibiotics Itching  ° ° °History of Present Illness  °  °77 y/o ? with the above complex PMH including coronary artery disease, hypertension, hyperlipidemia, stage III chronic kidney disease, pulmonary sarcoidosis, peripheral vascular disease, depression, obstructive sleep apnea, and stroke.  She is status post multiple percutaneous coronary inventions to the LAD, circumflex, and right coronary artery, all being performed in [place] between 2010 and 2016.  Her last ischemic evaluation was in July 2018 in the setting of chest pain and mild troponin elevation, and this was low risk.  She has been admitted twice in the past 3 months, with acute respiratory failure and atypical chest pain.  She was initially diuresed but this resulted in acute kidney injury.  An echo showed normal LV function.  She was subsequently discharged and she followed up in our office in early January, at which time she was stable.  Unfortunately, she required readmission in late January secondary to recurrent dyspnea with finding of significant hypoxia requiring BiPAP.  She was diuresed and also found to have a large right-sided pleural effusion which was tapped for 750 mL's.  Echo showed normal LV function.  She was also treated with prednisone therapy and subsequently discharged.   ° °She says that she did reasonably well following discharge though activity is quite limited at home.  She lives locally with her husband and typically uses a walker to get around.  She does not typically experience chest pain or dyspnea but again is fairly sedentary.  She was in her usual state of health until the early morning hours of March 20, when she was lying in bed with her CPAP on and felt her heart "pitter pattering."  She describes that her heart rate was elevated and somewhat irregular.  She denies experiencing chest pain, dyspnea, or presyncope.  She sat up and did not notice any improvement in her palpitations and she  called EMS.  Apparently, her blood glucose was 81.  She was noted on route to have PVCs on monitoring.  In the emergency department, she was hypertensive at 175/65.  She was afebrile.  ECG without acute ST or T changes.  Lab work notable for a potassium of 3.1.  BNP mildly elevated at 391.  Troponin 0 0.05.  She apparently reported back pain, though she denies this now.  CT of the chest and thoracic spine showed a large right and small left layering pleural effusions associated with consolidative opacity involving the right middle lobe and lower lobes-compressive atelectasis.  CT of the thoracic spine was negative for fracture or acute abnormality.  She was started on IV Lasix in the emergency department and subsequently admitted for further evaluation.  Currently she denies any palpitations, chest pain, dyspnea, or presyncope.  Despite CT findings, she denies any recent worsening of dyspnea. ° °Inpatient Medications  °  °• allopurinol    100 mg Oral Daily   amLODipine  10 mg Oral Daily   aspirin EC  81 mg Oral Daily   dicyclomine  10 mg Oral TID AC & HS   donepezil  10 mg Oral QHS   furosemide  20 mg Intravenous Q8H   guaiFENesin  600 mg Oral BID   heparin  5,000 Units Subcutaneous Q8H   hydrALAZINE  25 mg Oral TID   insulin aspart protamine- aspart  20 Units Subcutaneous BID AC   isosorbide mononitrate  30 mg Oral Daily   potassium chloride  40 mEq Oral BID   pregabalin  100 mg Oral BID   propranolol  10 mg Oral TID   rosuvastatin  20 mg Oral Daily   ticagrelor  60 mg Oral BID    Family History    Family History  Problem Relation Age of Onset   Heart disease Father    Heart attack Father    Heart disease Sister    Heart attack Sister    Heart disease Brother    Heart attack Brother    Tremor Mother    Asthma Grandchild    Breast cancer Maternal Aunt    She indicated that her mother is alive. She indicated that her father is deceased. She indicated that her  sister is deceased. She indicated that her brother is deceased. She indicated that the status of her maternal aunt is unknown. She indicated that the status of her grandchild is unknown.   Social History    Social History   Socioeconomic History   Marital status: Married    Spouse name: Not on file   Number of children: Not on file   Years of education: Not on file   Highest education level: Not on file  Occupational History   Occupation: retired  Scientist, product/process development strain: Not on file   Food insecurity:    Worry: Not on file    Inability: Not on file   Transportation needs:    Medical: Not on file    Non-medical: Not on file  Tobacco Use   Smoking status: Never Smoker   Smokeless tobacco: Never Used  Substance and Sexual Activity   Alcohol use: No    Alcohol/week: 0.0 standard drinks   Drug use: No   Sexual activity: Never  Lifestyle   Physical activity:    Days per week: Not on file    Minutes per session: Not on file   Stress: Not on file  Relationships   Social connections:    Talks on phone: Not on file    Gets together: Not on file    Attends religious service: Not on file    Active member of club or organization: Not on file    Attends meetings of clubs or organizations: Not on file    Relationship status: Not on file   Intimate partner violence:    Fear of current or ex partner: Not on file    Emotionally abused: Not on file    Physically abused: Not on file    Forced sexual activity: Not on file  Other Topics Concern   Not on file  Social History Narrative   Not on file     Review of Systems    General:  No chills, fever, night sweats or weight changes.  Cardiovascular:  No chest pain, dyspnea on exertion, edema, orthopnea, palpitations, paroxysmal nocturnal dyspnea.  +++ palpitations. Dermatological: No rash, lesions/masses Respiratory: No cough,  dyspnea °Urologic: No hematuria, dysuria °Abdominal:   No nausea,  vomiting, diarrhea, bright red blood per rectum, melena, or hematemesis °Neurologic:  No visual changes, wkns, changes in mental status. °All other systems reviewed and are otherwise negative except as noted above. ° °Physical Exam  °  °Blood pressure (!) 192/114, pulse 62, temperature (!) 97.5 °F (36.4 °C), temperature source Oral, resp. rate 20, height 5' 7" (1.702 m), weight 77.8 kg, SpO2 96 %.  °General: Pleasant, NAD °Psych: Normal affect. °Neuro: Alert and oriented X 3. Moves all extremities spontaneously. °HEENT: Normal  °Neck: Supple without bruits or JVD. °Lungs:  Resp regular and unlabored, diminished on the right with minimal air movement approximately one third of the way up.  Clear to auscultation on the left. °Heart: RRR no s3, s4, or murmurs. °Abdomen: Soft, non-tender, non-distended, BS + x 4.  °Extremities: No clubbing, cyanosis.  Trace to 1+ bilateral ankle edema. DP/PT/Radials 2+ and equal bilaterally. ° °Labs  °  °Troponin  °Recent Labs  °  07/30/18 °0458  °TROPONINI 0.05*  ° °Lab Results  °Component Value Date  ° WBC 10.9 (H) 07/30/2018  ° WBC 10.8 (H) 07/30/2018  ° HGB 11.0 (L) 07/30/2018  ° HGB 11.1 (L) 07/30/2018  ° HCT 34.9 (L) 07/30/2018  ° HCT 34.9 (L) 07/30/2018  ° MCV 88.8 07/30/2018  ° MCV 89.0 07/30/2018  ° PLT 366 07/30/2018  ° PLT 376 07/30/2018  °  °Recent Labs  °Lab 07/30/18 °0458  °NA 143  °K 3.1*  °CL 108  °CO2 28  °BUN 21  °CREATININE 1.27*  °CALCIUM 8.9  °PROT 6.8  °BILITOT 0.5  °ALKPHOS 113  °ALT 12  °AST 16  °GLUCOSE 67*  ° °Lab Results  °Component Value Date  ° CHOL 124 11/15/2016  ° HDL 29 (L) 11/15/2016  ° LDLCALC 60 11/15/2016  ° TRIG 175 (H) 11/15/2016  ° °  °Radiology Studies  °  °Dg Chest 2 View ° °Result Date: 07/30/2018 °CLINICAL DATA:  Chest pain EXAM: CHEST - 2 VIEW COMPARISON:  06/07/2018 FINDINGS: Normal heart size and stable mediastinal contours. Dense right lower lobe airspace disease. Right middle lobe airspace disease with upward bowing of the minor fissure.  Generalized interstitial coarsening. There may be pleural fluid on the right. No pneumothorax. IMPRESSION: Right middle and lower lobe consolidation. Electronically Signed   By: Jonathon  Watts M.D.   On: 07/30/2018 05:18  ° °Dg Lumbar Spine Complete ° °Result Date: 06/30/2018 °CLINICAL DATA:  Low back pain, fall yesterday EXAM: LUMBAR SPINE - COMPLETE 4+ VIEW COMPARISON:  CT scan 03/19/2018 FINDINGS: Preserved intervertebral disc spaces. Lower thoracic and lumbar spondylosis noted without appreciable fracture or subluxation. Aortoiliac atherosclerotic vascular disease. Degenerative facet arthropathy bilaterally at L4-5 and L5-S1. IMPRESSION: 1. Lower thoracic and lumbar spondylosis noted. No appreciable fracture or acute subluxation 2.  Aortic Atherosclerosis (ICD10-I70.0). Electronically Signed   By: Walter  Liebkemann M.D.   On: 06/30/2018 12:26  ° °Ct Chest Wo Contrast ° °Result Date: 07/30/2018 °CLINICAL DATA:  Initial evaluation for back pain. History of COPD, cardiac stent. EXAM: CT CHEST WITHOUT CONTRAST CT THORACIC SPINE WITHOUT CONTRAST TECHNIQUE: Multidetector CT imaging of the chest was performed following the standard protocol without IV contrast. COMPARISON:  Comparison made with prior radiograph from earlier the same day as well as prior chest CT from 06/05/2018. FINDINGS: Cardiovascular: Intrathoracic aorta normal in caliber without aneurysm or visible acute finding on this noncontrast examination. Moderate atherosclerosis. Visualized great vessels normal in caliber without   obvious abnormality. Cardiomegaly with 3 vessel coronary artery calcifications. No pericardial effusion. Main pulmonary artery dilated up to 3.1 cm, suggesting chronic underlying pulmonary hypertension, similar to previous. Mediastinum/Nodes: Thyroid normal. Multiple predominantly subcentimeter calcified lymph nodes seen throughout the mediastinum and bilateral hila, compatible with prior granulomatous infection. No enlarged  axillary nodes. Overall, appearance is stable from previous. No new or concerning adenopathy. Trace layering secretions noted within the esophageal lumen. Esophagus otherwise unremarkable. Lungs/Pleura: Tracheobronchial tree intact and patent. Large layering right pleural effusion with associated sub pulmonic component. Small layering left effusion present as well. Associated consolidative opacities present within the right middle and lower lobes. Mild hazy opacities within the posterior left upper and lower lobes favored to reflect atelectasis and/or mild congestion. No pneumothorax. 5 mm right lower lobe nodule stable from previous, likely related to prior granulomatous disease. No other new worrisome pulmonary nodule or mass. Upper Abdomen: Visualized upper abdomen demonstrates no acute finding. Adrenal glands are somewhat diffusely enlarged bilaterally, which could reflect underlying adrenal hyperplasia, stable from previous. Scattered prominent gastrohepatic, porta hepatis, and aortocaval lymph nodes noted, again likely related to prior granulomatous infection. Few punctate calcified granulomas noted within the spleen. Musculoskeletal: External soft tissues demonstrate no acute finding. No acute osseous abnormality. No discrete lytic or blastic osseous lesions. Remotely healed fracture of the left posterolateral sixth rib noted. Visualized external soft tissues demonstrate no acute finding. CT THORACIC SPINE Alignment: Vertebral bodies normally aligned with preservation of the normal thoracic kyphosis. No listhesis or malalignment. Vertebrae: Cervical ACDF partially visualized. Vertebral body heights well maintained without evidence for acute or chronic fracture. No discrete lytic or blastic osseous lesions. Visualized ribs intact and normal. Paraspinal and other soft tissues: Paraspinous soft tissues demonstrate no acute finding. Disc levels: Multilevel degenerative endplate spurring seen throughout the  thoracic spine, most notable within the mid and lower aspect. Mild scattered posterior element hypertrophy, most notable at T10-11. No significant disc pathology. No significant spinal stenosis or significant bony foraminal narrowing. IMPRESSION: CT CHEST IMPRESSION: 1. Large right with small left layering pleural effusions. Associated consolidative opacity involving the right middle and lower lobes felt to be most consistent with compressive atelectasis, although superimposed infiltrate could be considered in the correct clinical setting. 2. Sequelae of chronic granulomatous disease with multiple calcified mediastinal and hilar adenopathy, with calcified granulomata of the spleen. 3. 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 4. Cardiomegaly with diffuse 3 vessel coronary artery calcifications with moderate aortic atherosclerosis. 5. Cardiomegaly with 3 vessel coronary artery calcifications. 6. Mild thickening of the adrenal glands bilaterally, which could reflect an underlying degree of adrenal hyperplasia. CT THORACIC SPINE IMPRESSION: 1. No fracture or other acute abnormality within the thoracic spine. 2. Moderate chronic endplate degenerative spurring throughout the thoracic spine. No appreciable significant disc pathology or stenosis. 3. Mild multilevel facet hypertrophy, most notable at T10-11, which could contribute to underlying back pain. Electronically Signed   By: Benjamin  McClintock M.D.   On: 07/30/2018 06:54  ° °Ct T-spine No Charge ° °Result Date: 07/30/2018 °CLINICAL DATA:  Initial evaluation for back pain. History of COPD, cardiac stent. EXAM: CT CHEST WITHOUT CONTRAST CT THORACIC SPINE WITHOUT CONTRAST TECHNIQUE: Multidetector CT imaging of the chest was performed following the standard protocol without IV contrast. COMPARISON:  Comparison made with prior radiograph from earlier the same  day as well as prior chest CT from 06/05/2018. FINDINGS: Cardiovascular: Intrathoracic   aorta normal in caliber without aneurysm or visible acute finding on this noncontrast examination. Moderate atherosclerosis. Visualized great vessels normal in caliber without obvious abnormality. Cardiomegaly with 3 vessel coronary artery calcifications. No pericardial effusion. Main pulmonary artery dilated up to 3.1 cm, suggesting chronic underlying pulmonary hypertension, similar to previous. Mediastinum/Nodes: Thyroid normal. Multiple predominantly subcentimeter calcified lymph nodes seen throughout the mediastinum and bilateral hila, compatible with prior granulomatous infection. No enlarged axillary nodes. Overall, appearance is stable from previous. No new or concerning adenopathy. Trace layering secretions noted within the esophageal lumen. Esophagus otherwise unremarkable. Lungs/Pleura: Tracheobronchial tree intact and patent. Large layering right pleural effusion with associated sub pulmonic component. Small layering left effusion present as well. Associated consolidative opacities present within the right middle and lower lobes. Mild hazy opacities within the posterior left upper and lower lobes favored to reflect atelectasis and/or mild congestion. No pneumothorax. 5 mm right lower lobe nodule stable from previous, likely related to prior granulomatous disease. No other new worrisome pulmonary nodule or mass. Upper Abdomen: Visualized upper abdomen demonstrates no acute finding. Adrenal glands are somewhat diffusely enlarged bilaterally, which could reflect underlying adrenal hyperplasia, stable from previous. Scattered prominent gastrohepatic, porta hepatis, and aortocaval lymph nodes noted, again likely related to prior granulomatous infection. Few punctate calcified granulomas noted within the spleen. Musculoskeletal: External soft tissues demonstrate no acute finding. No acute osseous abnormality. No discrete  lytic or blastic osseous lesions. Remotely healed fracture of the left posterolateral sixth rib noted. Visualized external soft tissues demonstrate no acute finding. CT THORACIC SPINE Alignment: Vertebral bodies normally aligned with preservation of the normal thoracic kyphosis. No listhesis or malalignment. Vertebrae: Cervical ACDF partially visualized. Vertebral body heights well maintained without evidence for acute or chronic fracture. No discrete lytic or blastic osseous lesions. Visualized ribs intact and normal. Paraspinal and other soft tissues: Paraspinous soft tissues demonstrate no acute finding. Disc levels: Multilevel degenerative endplate spurring seen throughout the thoracic spine, most notable within the mid and lower aspect. Mild scattered posterior element hypertrophy, most notable at T10-11. No significant disc pathology. No significant spinal stenosis or significant bony foraminal narrowing. IMPRESSION: CT CHEST IMPRESSION: 1. Large right with small left layering pleural effusions. Associated consolidative opacity involving the right middle and lower lobes felt to be most consistent with compressive atelectasis, although superimposed infiltrate could be considered in the correct clinical setting. 2. Sequelae of chronic granulomatous disease with multiple calcified mediastinal and hilar adenopathy, with calcified granulomata of the spleen. 3. 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 4. Cardiomegaly with diffuse 3 vessel coronary artery calcifications with moderate aortic atherosclerosis. 5. Cardiomegaly with 3 vessel coronary artery calcifications. 6. Mild thickening of the adrenal glands bilaterally, which could reflect an underlying degree of adrenal hyperplasia. CT THORACIC SPINE IMPRESSION: 1. No fracture or other acute abnormality within the thoracic spine. 2. Moderate chronic  endplate degenerative spurring throughout the thoracic spine. No appreciable significant disc pathology or stenosis. 3. Mild multilevel facet hypertrophy, most notable at T10-11, which could contribute to underlying back pain. Electronically Signed   By: Jeannine Boga M.D.   On: 07/30/2018 06:54    ECG & Cardiac Imaging    Regular sinus rhythm, first-degree AV block, left axis deviation, IVCD, LVH.  No acute changes - personally reviewed.  Assessment & Plan    1.  Palpitations/occasional PVCs: Patient awoke early this morning with a sensation that her heart  was fluttering and racing.  Upon EMS arrival, she was reportedly shown to have occasional PVCs.  The same is true on telemetry currently.  She was not having any chest pain, dyspnea, or presyncope.  She is hypokalemic with a potassium of 3.1.  Magnesium is normal.  We have ordered potassium supplementation.  Continue to follow on telemetry.  Recent echocardiogram in January showed normal LV function.  2.  Recurrent right pleural effusion: Recent admission in late January with respiratory failure and right pleural effusion requiring thoracentesis of 750 mL's.  She denies experiencing dyspnea at home though it sounds as though activity is very limited.  CT of her chest again shows a large right pleural effusion.  She does carry a history of pulmonary sarcoidosis and prior PFTs showed restrictive disease.  She is now pending repeat thoracentesis.  Given recurrence of effusion we will send off an ANA, ANCA, ESR, and CRP.  We may need to consider discontinuation of hydralazine.  3.  Acute on chronic diastolic congestive heart failure: Patient with mild lower extremity edema on examination.  As above, she denies any recent worsening of dyspnea or orthopnea.  BNP was mildly elevated at 391.  She has received IV Lasix in the emergency department and is currently ordered 20 mg every 8 hours.  She was using Lasix 20 mg Monday Wednesday Friday at home.   Body habitus limits examination though I suspect she should not need intravenous Lasix for more than 1 day.  I will cut her back to Lasix 20 twice daily with a plan to resume Lasix 20 orally daily tomorrow.  She did have acute kidney injury in the setting of overdiuresis in December.  4.  Coronary artery disease/demand ischemia: Status post prior interventions to the LAD, circumflex, and right coronary artery in [place].  She had a negative stress test in 2018 and normal LV function by echo this past January.  She has not been experiencing chest pain however, troponin is mildly elevated at 0.05.  This likely represents demand ischemia in the setting of hypertension.  She is currently asymptomatic.  Continue home regimen including aspirin, beta-blocker, statin, Brilinta, and hydralazine/nitrate.  This does not represent acute coronary syndrome and we will not pursue ischemic evaluation at this time.  5.  Essential hypertension: Blood pressure elevated on arrival and higher this morning.  Resume home medications, follow, and adjust.  6.  Hypokalemia: Potassium 3.1 on arrival.  Supplementation ordered.  Magnesium was normal.  7.  Hyperlipidemia: LDL was 60 in July 2018.  This will require outpatient follow-up.  Continue statin therapy.  8.  Stage III chronic kidney disease: Creatinine improved compared to baseline at 1.27 this morning.  Follow with diuresis.  9.  Type 2 diabetes mellitus: A1c 7.7 in October 2019.  Insulin per internal medicine.  Signed, Murray Hodgkins, NP 07/30/2018, 11:38 AM  For questions or updates, please contact   Please consult [email or web address] for contact info under Cardiology/STEMI.

## 2018-07-30 NOTE — ED Notes (Signed)
Pt is AOx4, vss, she denies any chest pain or palpitations at this time and she is in bed with rails upx2, on the monitor, husband is at the bedside and the call bell is within reach. We will continue to monitor the pt.

## 2018-07-30 NOTE — Telephone Encounter (Signed)
Last Ov 07/12/2018   Meds listed on pt's current medication list Rx's have not been filled by PCP   Sent to PCP for approval

## 2018-07-30 NOTE — ED Provider Notes (Signed)
[facility] Emergency Department Provider Note  ____________________________________________   First MD Initiated Contact with Patient 07/30/18 [phone]     (approximate)  I have reviewed the triage vital signs and the nursing notes.   HISTORY  Chief Complaint Chest Pain and Back Pain  Level 5 caveat:  history/ROS limited by chronic dementia.  History is provided largely by the patient's husband who is at bedside.  HPI Pamela Collier is a 78 y.o. female with extensive chronic medical history which notably includes mild COPD, CHF, multiple episodes in the past of acute respiratory failure with pulmonary edema, and a distant diagnosis of pulmonary sarcoidosis.  She presents for evaluation of some pain in the middle of her back and "a pitter-pat feeling in my chest and back."  She says that she started feeling bad earlier in the day yesterday and it persisted into the night.  She was afraid to go to sleep because she felt short of breath when she lied down and the "pitter-pat" got worse.  She denies fever/chills, chest pain, nausea, vomiting, and abdominal pain.  She denies having pain in her chest and back but keeps referring to the "pitter-pat" feeling.  She only has shortness of breath when she lies down but says it makes her scared.  She uses a CPAP at night but does not use supplemental oxygen.  All of this information was confirmed by her husband.  The symptoms were severe but currently she feels okay while reclining at a 45 degree angle.  Nothing in particular made the symptoms better.   Of note, we are currently in the middle of a COVID-19 pandemic but which has not yet become prevalent in this community.  The patient reports that she has been isolating herself in her house and has had no contact with anyone's suspected or confirmed of having COVID-19 and has in fact had minimal contact with anyone other than her husband.        Past Medical History:  Diagnosis  Date   Acute respiratory failure ([place]) 11/21/2014   Anxiety    Arthritis 08/05/2013   Atherosclerotic peripheral vascular disease with gangrene [facility]) august 2012   Cardiomyopathy, ischemic    a. EF 35 to 40% by echo in 2013 b. EF improved to 50-55% by echo in 04/2015; 11/2016 Echo: EF 55-60%, Gr1 DD, mildly dil LA, nl RV fxn.   Cerebral infarct ([place]) 08/17/2013   Chronic diastolic CHF (congestive heart failure) ([place])    a. EF 50-55% by echo in 04/2015; b. 11/2016 Echo: EF 55-60%, Gr1 DD.   COPD (chronic obstructive pulmonary disease) (HCC)    Coronary artery disease, occlusive    a. Previous PCI to the LAD, LCx, and RCA in 2010, 2011, 2013, and 2016. All performed in [place]; b. 11/2016 Lexiscan MV: EF 43%, no ischemia->low risk.   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   Pill esophagitis    Pleural effusion 10/25/2012   10/2012 CT chest >> small to moderate R lung effusion>> chylothorax, 100% lymphs  10/2013 thoracentesis> cytology negative, WBC 1471, > 90% "small lymphs" 01/2014 CT chest> near complete resolution of pleural effusion, stable lymphadenopathy 08/2014 CT chest [place] ([facility]): small right sided effusion decreased in size, stable mediastinal lymphadenopathy 1.0cm largest, 64m  Pulmonary sarcoidosis ([place] 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/2012   [place] 03/2013 Full PFT [facility] > UNACCEPTABLE AND NOT REPRODUCIBLE DATA> Ratio 71% FEV 1 1.02 L (55% pred), FVC 1.31 L (49% pred) could not do lung volumes or  DLCO    Sleep apnea    Stage III chronic kidney disease ([facility]     Patient Active Problem List   Diagnosis Date Noted   Vascular dementia without behavioral disturbance ([place] 07/13/2018   HCAP (healthcare-associated pneumonia) 06/05/2018   Decubitus ulcer of sacral region, stage 1 06/02/2018   Lymphedema 05/25/2018   Acute on chronic diastolic heart failure ([place] 04/25/2018   Atherosclerosis of both carotid arteries 04/18/2018   Near syncope 03/19/2018   Functional diarrhea 03/13/2018   Vitamin a deficiency, unspecified 02/22/2018   Hypercalcemia 02/20/2018   Environmental and seasonal allergies 01/15/2018   Post-nasal drip 01/15/2018   Restless legs 10/17/2017   Normocytic anemia, not due to blood loss 09/27/2017   Melena 09/25/2017   Cognitive decline 08/16/2017   Polypharmacy 07/28/2017   CKD (chronic kidney disease) stage 3, GFR 30-59 ml/min (HCC) 11/15/2016   History of fall 11/12/2016   Osteoarthritis 11/10/2016   Cervical stenosis of spinal canal 10/10/2016   Cervical radiculopathy at C7 07/02/2016   Epigastric hernia 04/01/2016   Coronary artery disease    Type 2 DM with CKD stage 3 and hypertension ([place]    Chronic renal insufficiency    Hypertensive heart disease    OSA on CPAP 07/07/2015   Hospital discharge follow-up 07/07/2015   Pleural effusion, right    Chronic diastolic CHF (congestive heart failure) ([place] 04/18/2015   Diabetic neuropathy ([place] 02/21/2015   Benign neoplasm of sigmoid colon    Low back pain with radiation 04/04/2014   Essential hypertension 04/04/2014   Hyperlipidemia LDL goal <70 02/23/2014   Long-term use of high-risk medication 02/23/2014   Other long term (current) drug therapy 02/23/2014   Dermatophytic onychia 08/17/2013   Stable angina ([place] 08/17/2013   Cerebral infarct ([place] 08/17/2013   Bony exostosis 08/17/2013   Obesity, unspecified 08/17/2013   Arthritis 08/05/2013    Unsteadiness on feet 03/24/2013   History of colonic polyps 02/20/2013   Sarcoidosis ([place] 12/07/2012   Dysphagia 12/07/2012   Pulmonary nodule seen on imaging study 05/20/2012   Depression with anxiety 04/03/2012   Diabetic nephropathy associated with type 2 diabetes mellitus ([place] 02/29/2012   Cardiomyopathy, ischemic    Hepatic steatosis    Osteoporosis, post-menopausal    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 secondary to bleeding/partial   ANTERIOR CERVICAL DECOMP/DISCECTOMY FUSION N/A 10/10/2016   Procedure: Anterior Cervical Discectomy Fusion - Cervical four4- five - Cervical five-Cervical six - Cervical six-Cervical seven;  Surgeon: PEarnie Larsson MD;  [facility]  Service: Neurosurgery;  Laterality: N/A;   BREAST CYST ASPIRATION Right    CARPAL TUNNEL RELEASE Bilateral    [place]     [place]   COLONOSCOPY WITH PROPOFOL N/A 01/09/2015   Procedure:  COLONOSCOPY WITH PROPOFOL;  Surgeon: Lucilla Lame, MD;  Location[facility] ENDOSCOPY;  Service: Endoscopy;  Laterality: N/A;   CORONARY ANGIOPLASTY WITH STENT PLACEMENT     [place]; [facility]   EYE SURGERY     bil cataracts   HERNIA REPAIR     umbilical/ Dr Pat Patrick   OOPHORECTOMY     PTCA  August 2012   Right Posterior tibial artery , [place]   TOE AMPUTATION  Sept 2012   Right 2nd toe, [place]    Prior to Admission medications   Medication Sig Start Date End Date Taking? Authorizing Provider  acetaminophen (TYLENOL) 500 MG tablet Take 1,000 mg by mouth every 6 (six) hours as needed for mild pain or headache.     [provider]  allopurinol (ZYLOPRIM) 100 MG tablet Take 1 tablet (100 mg total) by mouth daily. 03/22/18 03/22/19  Loletha Grayer, MD  ALPRAZolam [name]) 0.25 MG tablet Take 1  tablet (0.25 mg total) by mouth 2 (two) times daily as needed for anxiety. 07/20/18   [name], MD  amLODipine (NORVASC) 5 MG tablet Take 2 tablets (10 mg total) by mouth daily. 04/30/18   Henreitta Leber, MD  aspirin EC 81 MG tablet Take 81 mg by mouth daily.    [provider]  B-D UF III MINI PEN NEEDLES 31G X 5 MM MISC USE THREE TIMES A DAY 02/18/18   [name], MD  blood glucose meter kit and supplies Dispense based on patient and insurance preference. Use up to three times daily as directed. (FOR ICD-10 E11.21) 06/03/18   [name], MD  dicyclomine (BENTYL) 10 MG capsule Take 1 capsule (10 mg total) by mouth 4 (four) times daily -  before meals and at bedtime. 03/10/18   [name], MD  diphenoxylate-atropine (LOMOTIL) 2.5-0.025 MG tablet Take 1 tablet by mouth 4 (four) times daily as needed for diarrhea or loose stools. Patient not taking: Reported on 07/12/2018 06/08/18   Gladstone Lighter, MD  donepezil (ARICEPT) 10 MG tablet Take 10 mg by mouth at bedtime.    [provider]  fluticasone (FLONASE) 50 MCG/ACT nasal spray Place 2 sprays into both nostrils daily as needed for rhinitis. Patient not taking: Reported on 07/12/2018 01/20/18   [name], MD  furosemide (LASIX) 20 MG tablet Take 1 tablet (20 mg total) by mouth daily. Take 1 tablet (20 mg ) once daily on Monday, Wednesday and Friday. 06/08/18   Gladstone Lighter, MD  glucose blood (ACCU-CHEK AVIVA PLUS) test strip USE TO CHECK GLUCOSE THREE TIMES DAILY 02/16/18   [name], MD  glucose blood test strip Use to check blood sugars 2 times daily 02/16/18   [name], MD  guaiFENesin (MUCINEX) 600 MG 12 hr tablet Take 600 mg by mouth 2 (two) times daily.    [provider]  guaiFENesin-dextromethorphan (ROBITUSSIN DM) 100-10 MG/5ML syrup Take 5 mLs by mouth every 4 (four) hours as needed for cough. Patient not taking: Reported on 06/28/2018 04/30/18   Henreitta Leber, MD    HUMALOG MIX 75/25 KWIKPEN (75-25) 100 UNIT/ML Kwikpen 20 units with breakfast and 30 units with evening meal 07/13/18   [name], MD  hydrALAZINE (APRESOLINE) 25 MG tablet Take 1 tablet (25 mg total) by mouth 3 (three) times daily. 07/05/18 10/03/18  Darylene Price A, FNP  ipratropium-albuterol (DUONEB) 0.5-2.5 (3) MG/3ML SOLN Take 3 mLs by nebulization every 6 (six) hours as  needed (wheezing, shortness of breath). 07/27/18   [name], MD  isosorbide mononitrate (IMDUR) 30 MG 24 hr tablet Take 1 tablet (30 mg total) by mouth daily. 03/03/18   Minna Merritts, MD  mometasone-formoterol (DULERA) 200-5 MCG/ACT AERO Inhale 2 puffs into the lungs 2 (two) times daily. 02/01/18   [name], MD  nitroGLYCERIN (NITROLINGUAL) 0.4 MG/SPRAY spray Place 1 spray under the tongue every 5 (five) minutes x 3 doses as needed for chest pain. 01/14/17   [name], MD  ondansetron (ZOFRAN) 4 MG tablet Take 4 mg by mouth every 8 (eight) hours as needed for nausea or vomiting.    [provider]  pregabalin (LYRICA) 100 MG capsule Take 100 mg by mouth 2 (two) times daily.     [provider]  propranolol (INDERAL) 10 MG tablet Take 1 tablet (10 mg total) by mouth 3 (three) times daily. 07/05/18   [name], MD  rosuvastatin (CRESTOR) 20 MG tablet Take 1 tablet (20 mg total) by mouth daily. 04/16/18   [name], MD  senna (SENOKOT) 8.6 MG tablet Take 1 tablet by mouth 2 (two) times daily as needed for constipation.    [provider]  ticagrelor (BRILINTA) 60 MG TABS tablet Take 1 tablet (60 mg total) by mouth 2 (two) times daily. 04/22/18   Minna Merritts, MD  traMADol (ULTRAM) 50 MG tablet Take 1 tablet (50 mg total) by mouth 2 (two) times daily as needed. Patient not taking: Reported on 07/12/2018 06/30/18   Victorino Dike, FNP    Allergies Cephalexin; Contrast media [iodinated diagnostic agents]; Doxycycline; Gabapentin; and Sulfa  antibiotics  Family History  Problem Relation Age of Onset   Heart disease Father    Heart attack Father    Heart disease Sister    Heart attack Sister    Heart disease Brother    Heart attack Brother    Tremor Mother    Asthma Grandchild    Breast cancer Maternal Aunt     Social History Social History   Tobacco Use   Smoking status: Never Smoker   Smokeless tobacco: Never Used  Substance Use Topics   Alcohol use: No    Alcohol/week: 0.0 standard drinks   Drug use: No    Review of Systems Level 5 caveat:  history/ROS limited by chronic dementia.  History is provided largely by the patient's husband who is at bedside.  Constitutional: No fever/chills Eyes: No visual changes. ENT: No sore throat. Cardiovascular: Denies chest pain.  "Pitter-pat" sensation in her chest. Respiratory: SOB with supine position. Gastrointestinal: No abdominal pain.  No nausea, no vomiting.  No diarrhea.  No constipation. Genitourinary: Negative for dysuria. Musculoskeletal: Negative for neck pain.  Negative for back pain. Integumentary: Negative for rash. Neurological: Negative for headaches, focal weakness or numbness.   ____________________________________________   PHYSICAL EXAM:  VITAL SIGNS: ED Triage Vitals [07/30/18 0458]  Enc Vitals Group     BP (!) 175/65     Pulse Rate 61     Resp 20     Temp 98.1 F (36.7 C)     Temp Source Oral     SpO2 97 %     Weight 79 kg (174 lb 2.6 oz)     Height 1.702 m (5' 7" )     Head Circumference      Peak Flow      Pain Score 6     Pain Loc  Pain Edu?      Excl. in [place]?     Constitutional: Alert and oriented to person.  No acute distress. Eyes: Conjunctivae are normal.  Head: Atraumatic. Nose: No congestion/rhinnorhea. Mouth/Throat: Mucous membranes are moist. Neck: No stridor.  No meningeal signs.   Cardiovascular: Normal rate, regular rhythm. Good peripheral circulation. Grossly normal heart  sounds. Respiratory: Normal respiratory effort.  No retractions. Decreased breath sounds on the right. Gastrointestinal: Soft and nontender. No distention.  Musculoskeletal: No lower extremity tenderness nor edema. No gross deformities of extremities. Neurologic:  Normal speech and language. No gross focal neurologic deficits are appreciated.  Skin:  Skin is warm, dry and intact. No rash noted.   ____________________________________________   LABS (all labs ordered are listed, but only abnormal results are displayed)  Labs Reviewed  BASIC METABOLIC PANEL - Abnormal; Notable for the following components:      Result Value   Potassium 3.1 (*)    Glucose, Bld 67 (*)    Creatinine, Ser 1.27 (*)    GFR calc non Af Amer 40 (*)    GFR calc Af Amer 47 (*)    All other components within normal limits  CBC - Abnormal; Notable for the following components:   WBC 10.9 (*)    Hemoglobin 11.0 (*)    HCT 34.9 (*)    RDW 16.1 (*)    All other components within normal limits  TROPONIN I - Abnormal; Notable for the following components:   Troponin I 0.05 (*)    All other components within normal limits  HEPATIC FUNCTION PANEL - Abnormal; Notable for the following components:   Albumin 3.3 (*)    All other components within normal limits  LIPASE, BLOOD - Abnormal; Notable for the following components:   Lipase 59 (*)    All other components within normal limits  BRAIN NATRIURETIC PEPTIDE - Abnormal; Notable for the following components:   B Natriuretic Peptide 391.0 (*)    All other components within normal limits  CBC WITH DIFFERENTIAL/PLATELET - Abnormal; Notable for the following components:   WBC 10.8 (*)    Hemoglobin 11.1 (*)    HCT 34.9 (*)    RDW 16.2 (*)    Abs Immature Granulocytes 0.11 (*)    All other components within normal limits  DIFFERENTIAL   ____________________________________________  EKG  ED ECG REPORT I, Hinda Kehr, the attending physician, personally  viewed and interpreted this ECG.  Date: 07/30/2018 EKG Time: 4:57 AM Rate: 60 Rhythm: Sinus rhythm with first-degree AV block QRS Axis: Left axis deviation Intervals: IVCD, possible atypical right bundle branch block with LVH ST/T Wave abnormalities: Non-specific ST segment / T-wave changes, but no clear evidence of acute ischemia. Narrative Interpretation: no definitive evidence of acute ischemia; does not meet STEMI criteria.  Similar to prior EKG but not exactly the same.   ____________________________________________  RADIOLOGY   ED MD interpretation: Right middle and lower lobe consolidation with possible pleural fluid on the right.  Official radiology report(s): Dg Chest 2 View  Result Date: 07/30/2018 CLINICAL DATA:  Chest pain EXAM: CHEST - 2 VIEW COMPARISON:  06/07/2018 FINDINGS: Normal heart size and stable mediastinal contours. Dense right lower lobe airspace disease. Right middle lobe airspace disease with upward bowing of the minor fissure. Generalized interstitial coarsening. There may be pleural fluid on the right. No pneumothorax. IMPRESSION: Right middle and lower lobe consolidation. Electronically Signed   By: Monte Fantasia M.D.   On: 07/30/2018 05:18  Ct Chest Wo Contrast  Result Date: 07/30/2018 CLINICAL DATA:  Initial evaluation for back pain. History of COPD, cardiac stent. EXAM: CT CHEST WITHOUT CONTRAST CT THORACIC SPINE WITHOUT CONTRAST TECHNIQUE: Multidetector CT imaging of the chest was performed following the standard protocol without IV contrast. COMPARISON:  Comparison made with prior radiograph from earlier the same day as well as prior chest CT from 06/05/2018. FINDINGS: Cardiovascular: Intrathoracic aorta normal in caliber without aneurysm or visible acute finding on this noncontrast examination. Moderate atherosclerosis. Visualized great vessels normal in caliber without obvious abnormality. Cardiomegaly with 3 vessel coronary artery calcifications. No  pericardial effusion. Main pulmonary artery dilated up to 3.1 cm, suggesting chronic underlying pulmonary hypertension, similar to previous. Mediastinum/Nodes: Thyroid normal. Multiple predominantly subcentimeter calcified lymph nodes seen throughout the mediastinum and bilateral hila, compatible with prior granulomatous infection. No enlarged axillary nodes. Overall, appearance is stable from previous. No new or concerning adenopathy. Trace layering secretions noted within the esophageal lumen. Esophagus otherwise unremarkable. Lungs/Pleura: Tracheobronchial tree intact and patent. Large layering right pleural effusion with associated sub pulmonic component. Small layering left effusion present as well. Associated consolidative opacities present within the right middle and lower lobes. Mild hazy opacities within the posterior left upper and lower lobes favored to reflect atelectasis and/or mild congestion. No pneumothorax. 5 mm right lower lobe nodule stable from previous, likely related to prior granulomatous disease. No other new worrisome pulmonary nodule or mass. Upper Abdomen: Visualized upper abdomen demonstrates no acute finding. Adrenal glands are somewhat diffusely enlarged bilaterally, which could reflect underlying adrenal hyperplasia, stable from previous. Scattered prominent gastrohepatic, porta hepatis, and aortocaval lymph nodes noted, again likely related to prior granulomatous infection. Few punctate calcified granulomas noted within the spleen. Musculoskeletal: External soft tissues demonstrate no acute finding. No acute osseous abnormality. No discrete lytic or blastic osseous lesions. Remotely healed fracture of the left posterolateral sixth rib noted. Visualized external soft tissues demonstrate no acute finding. CT THORACIC SPINE Alignment: Vertebral bodies normally aligned with preservation of the normal thoracic kyphosis. No listhesis or malalignment. Vertebrae: Cervical ACDF partially  visualized. Vertebral body heights well maintained without evidence for acute or chronic fracture. No discrete lytic or blastic osseous lesions. Visualized ribs intact and normal. Paraspinal and other soft tissues: Paraspinous soft tissues demonstrate no acute finding. Disc levels: Multilevel degenerative endplate spurring seen throughout the thoracic spine, most notable within the mid and lower aspect. Mild scattered posterior element hypertrophy, most notable at T10-11. No significant disc pathology. No significant spinal stenosis or significant bony foraminal narrowing. IMPRESSION: CT CHEST IMPRESSION: 1. Large right with small left layering pleural effusions. Associated consolidative opacity involving the right middle and lower lobes felt to be most consistent with compressive atelectasis, although superimposed infiltrate could be considered in the correct clinical setting. 2. Sequelae of chronic granulomatous disease with multiple calcified mediastinal and hilar adenopathy, with calcified granulomata of the spleen. 3. 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 4. Cardiomegaly with diffuse 3 vessel coronary artery calcifications with moderate aortic atherosclerosis. 5. Cardiomegaly with 3 vessel coronary artery calcifications. 6. Mild thickening of the adrenal glands bilaterally, which could reflect an underlying degree of adrenal hyperplasia. CT THORACIC SPINE IMPRESSION: 1. No fracture or other acute abnormality within the thoracic spine. 2. Moderate chronic endplate degenerative spurring throughout the thoracic spine. No appreciable significant disc pathology or stenosis. 3. Mild multilevel facet hypertrophy, most notable at T10-11, which  could contribute to underlying back pain. Electronically Signed   By: Jeannine Boga M.D.   On: 07/30/2018 06:54   Ct T-spine No Charge  Result Date:  07/30/2018 CLINICAL DATA:  Initial evaluation for back pain. History of COPD, cardiac stent. EXAM: CT CHEST WITHOUT CONTRAST CT THORACIC SPINE WITHOUT CONTRAST TECHNIQUE: Multidetector CT imaging of the chest was performed following the standard protocol without IV contrast. COMPARISON:  Comparison made with prior radiograph from earlier the same day as well as prior chest CT from 06/05/2018. FINDINGS: Cardiovascular: Intrathoracic aorta normal in caliber without aneurysm or visible acute finding on this noncontrast examination. Moderate atherosclerosis. Visualized great vessels normal in caliber without obvious abnormality. Cardiomegaly with 3 vessel coronary artery calcifications. No pericardial effusion. Main pulmonary artery dilated up to 3.1 cm, suggesting chronic underlying pulmonary hypertension, similar to previous. Mediastinum/Nodes: Thyroid normal. Multiple predominantly subcentimeter calcified lymph nodes seen throughout the mediastinum and bilateral hila, compatible with prior granulomatous infection. No enlarged axillary nodes. Overall, appearance is stable from previous. No new or concerning adenopathy. Trace layering secretions noted within the esophageal lumen. Esophagus otherwise unremarkable. Lungs/Pleura: Tracheobronchial tree intact and patent. Large layering right pleural effusion with associated sub pulmonic component. Small layering left effusion present as well. Associated consolidative opacities present within the right middle and lower lobes. Mild hazy opacities within the posterior left upper and lower lobes favored to reflect atelectasis and/or mild congestion. No pneumothorax. 5 mm right lower lobe nodule stable from previous, likely related to prior granulomatous disease. No other new worrisome pulmonary nodule or mass. Upper Abdomen: Visualized upper abdomen demonstrates no acute finding. Adrenal glands are somewhat diffusely enlarged bilaterally, which could reflect underlying adrenal  hyperplasia, stable from previous. Scattered prominent gastrohepatic, porta hepatis, and aortocaval lymph nodes noted, again likely related to prior granulomatous infection. Few punctate calcified granulomas noted within the spleen. Musculoskeletal: External soft tissues demonstrate no acute finding. No acute osseous abnormality. No discrete lytic or blastic osseous lesions. Remotely healed fracture of the left posterolateral sixth rib noted. Visualized external soft tissues demonstrate no acute finding. CT THORACIC SPINE Alignment: Vertebral bodies normally aligned with preservation of the normal thoracic kyphosis. No listhesis or malalignment. Vertebrae: Cervical ACDF partially visualized. Vertebral body heights well maintained without evidence for acute or chronic fracture. No discrete lytic or blastic osseous lesions. Visualized ribs intact and normal. Paraspinal and other soft tissues: Paraspinous soft tissues demonstrate no acute finding. Disc levels: Multilevel degenerative endplate spurring seen throughout the thoracic spine, most notable within the mid and lower aspect. Mild scattered posterior element hypertrophy, most notable at T10-11. No significant disc pathology. No significant spinal stenosis or significant bony foraminal narrowing. IMPRESSION: CT CHEST IMPRESSION: 1. Large right with small left layering pleural effusions. Associated consolidative opacity involving the right middle and lower lobes felt to be most consistent with compressive atelectasis, although superimposed infiltrate could be considered in the correct clinical setting. 2. Sequelae of chronic granulomatous disease with multiple calcified mediastinal and hilar adenopathy, with calcified granulomata of the spleen. 3. 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 4. Cardiomegaly with diffuse 3 vessel coronary artery calcifications  with moderate aortic atherosclerosis. 5. Cardiomegaly with 3 vessel coronary artery calcifications. 6. Mild thickening of the adrenal glands bilaterally, which could reflect an underlying degree of adrenal hyperplasia. CT THORACIC SPINE IMPRESSION: 1. No fracture or other acute abnormality within the thoracic spine. 2. Moderate chronic endplate  degenerative spurring throughout the thoracic spine. No appreciable significant disc pathology or stenosis. 3. Mild multilevel facet hypertrophy, most notable at T10-11, which could contribute to underlying back pain. Electronically Signed   By: Jeannine Boga M.D.   On: 07/30/2018 06:54    ____________________________________________   PROCEDURES   Procedure(s) performed (including Critical Care):  Procedures   ____________________________________________   INITIAL IMPRESSION / MDM / [place] / ED COURSE  As part of my medical decision making, I reviewed the following data within the [place] History obtained from family, Nursing notes reviewed and incorporated, Labs reviewed , EKG interpreted , Old chart reviewed, Radiograph reviewed , Discussed with admitting physician (Dr. Vianne Bulls) and Notes from prior ED visits         Differential diagnosis includes, but is not limited to, CHF exacerbation, pleural effusion, pneumonia, viral infection, pneumothorax, neoplasm, malignant effusion.  The patient is in no distress now but the symptoms of orthopnea suggest CHF exacerbation or effusion.  She is having no chest pain and is difficult to interpret her sensation of palpitations or "pitter-pat".  I reviewed her medical record and saw that 2 months ago she was admitted with acute respiratory failure in the setting of pulmonary edema.  At that time she had a large right-sided pleural effusion which required thoracentesis and they drained 750 mL.  It is possible that the fluid has reaccumulated and her chest x-ray which was  obtained upon her arrival shows right lower and middle lobe consolidations that could represent infection or fluid.  I will obtain a CT scan without contrast for further evaluation.  Given that there was some report of her having pain in the middle of her back as well I have asked the radiologist to do a thoracic spine recon but I have a low suspicion for bony abnormality.  Vital signs are stable and reassuring with no tachycardia, afebrile, and an SPO2 of 97%.  CBC is essentially normal with only very slight leukocytosis of 10.1.  Rest of her lab work including BNP, lipase, differential, LFTs, troponin, and BMP are all pending.  EKG shows no clinically significant change from prior no evidence of ischemia.  I will reassess after the lab work and CT scan are back.  Clinical Course as of Jul 30 734  Fri Jul 30, 2018  0619 Troponin I(!!): 0.05 [CF]  [phone] The patient CT scan is consistent with pleural effusions.  I do not believe this represents an infectious process.  I think she has some degree of CHF exacerbation I am giving Lasix 20 mg IV, but she is going to need a thoracentesis to allow her to breathe easily and comfortably.  I discussed the case with Dr. Vianne Bulls of the hospitalist service who agrees and will admit.   [CF]    Clinical Course User Index [CF] Hinda Kehr, MD    ____________________________________________  FINAL CLINICAL IMPRESSION(S) / ED DIAGNOSES  Final diagnoses:  Back pain  Orthopnea  Pleural effusion, right  Acute on chronic congestive heart failure, unspecified heart failure type ([place])     MEDICATIONS GIVEN DURING THIS VISIT:  Medications  furosemide (LASIX) injection 20 mg (has no administration in time range)     ED Discharge Orders    None       Note:  This document was prepared using Dragon voice recognition software and may include unintentional dictation errors.   Hinda Kehr, MD 07/30/18 [phone]

## 2018-07-30 NOTE — ED Notes (Signed)
Called and spoke with the patient's husband per request and informed him of the patient's admission to the hospital. He verbalized understanding.

## 2018-07-30 NOTE — ED Notes (Signed)
Date and time results received: 07/30/18 6:09 AM  Test: Troponin Critical Value: 0.05  Name of Provider Notified: Karma Greaser  Orders Received? Or Actions Taken?: Acknowledged

## 2018-07-30 NOTE — ED Notes (Signed)
Patient transported to X-ray 

## 2018-07-31 ENCOUNTER — Inpatient Hospital Stay: Payer: Medicare Other

## 2018-07-31 ENCOUNTER — Encounter: Payer: Self-pay | Admitting: *Deleted

## 2018-07-31 DIAGNOSIS — I25118 Atherosclerotic heart disease of native coronary artery with other forms of angina pectoris: Secondary | ICD-10-CM

## 2018-07-31 DIAGNOSIS — N183 Chronic kidney disease, stage 3 (moderate): Secondary | ICD-10-CM

## 2018-07-31 DIAGNOSIS — I509 Heart failure, unspecified: Secondary | ICD-10-CM

## 2018-07-31 DIAGNOSIS — I5033 Acute on chronic diastolic (congestive) heart failure: Secondary | ICD-10-CM

## 2018-07-31 LAB — BASIC METABOLIC PANEL
ANION GAP: 7 (ref 5–15)
BUN: 20 mg/dL (ref 8–23)
CO2: 26 mmol/L (ref 22–32)
Calcium: 8.8 mg/dL — ABNORMAL LOW (ref 8.9–10.3)
Chloride: 107 mmol/L (ref 98–111)
Creatinine, Ser: 1.17 mg/dL — ABNORMAL HIGH (ref 0.44–1.00)
GFR calc Af Amer: 52 mL/min — ABNORMAL LOW (ref >60)
GFR calc non Af Amer: 45 mL/min — ABNORMAL LOW (ref >60)
Glucose, Bld: 226 mg/dL — ABNORMAL HIGH (ref 70–99)
Potassium: 4.2 mmol/L (ref 3.5–5.1)
Sodium: 140 mmol/L (ref 135–145)

## 2018-07-31 LAB — PROTEIN, BODY FLUID (OTHER): Total Protein, Body Fluid Other: 2.3 g/dL

## 2018-07-31 LAB — ANA: Anti Nuclear Antibody (ANA): NEGATIVE

## 2018-07-31 LAB — LIPID PANEL
Cholesterol: 179 mg/dL (ref 0–200)
HDL: 49 mg/dL (ref >40)
LDL Cholesterol: 92 mg/dL (ref 0–99)
Total CHOL/HDL Ratio: 3.7 RATIO
Triglycerides: 192 mg/dL — ABNORMAL HIGH (ref <150)
VLDL: 38 mg/dL (ref 0–40)

## 2018-07-31 LAB — CBC
HCT: 34.8 % — ABNORMAL LOW (ref 36.0–46.0)
Hemoglobin: 10.8 g/dL — ABNORMAL LOW (ref 12.0–15.0)
MCH: 27.8 pg (ref 26.0–34.0)
MCHC: 31 g/dL (ref 30.0–36.0)
MCV: 89.7 fL (ref 80.0–100.0)
Platelets: 314 10*3/uL (ref 150–400)
RBC: 3.88 MIL/uL (ref 3.87–5.11)
RDW: 16.5 % — ABNORMAL HIGH (ref 11.5–15.5)
WBC: 8.1 10*3/uL (ref 4.0–10.5)
nRBC: 0 % (ref 0.0–0.2)

## 2018-07-31 LAB — GLUCOSE, CAPILLARY
Glucose-Capillary: 187 mg/dL — ABNORMAL HIGH (ref 70–99)
Glucose-Capillary: 243 mg/dL — ABNORMAL HIGH (ref 70–99)
Glucose-Capillary: 216 mg/dL — ABNORMAL HIGH (ref 70–99)
Glucose-Capillary: 175 mg/dL — ABNORMAL HIGH (ref 70–99)

## 2018-07-31 NOTE — Progress Notes (Signed)
Pt refused Cpap for tonight

## 2018-07-31 NOTE — Progress Notes (Signed)
[facility] [place] [name]    MR#:  [id number]  DATE OF BIRTH:  1940-12-27  SUBJECTIVE:  CHIEF COMPLAINT: Patient shortness of breath is much better.  Denies any chest pain.  REVIEW OF SYSTEMS:  CONSTITUTIONAL: No fever, fatigue or weakness.  EYES: No blurred or double vision.  EARS, NOSE, AND THROAT: No tinnitus or ear pain.  RESPIRATORY: Intermittent episodes of cough, endorsing improving shortness of breath, denies wheezing or hemoptysis.  CARDIOVASCULAR: No chest pain, orthopnea, edema.  GASTROINTESTINAL: No nausea, vomiting, diarrhea or abdominal pain.  GENITOURINARY: No dysuria, hematuria.  ENDOCRINE: No polyuria, nocturia,  HEMATOLOGY: No anemia, easy bruising or bleeding SKIN: No rash or lesion. MUSCULOSKELETAL: No joint pain or arthritis.   NEUROLOGIC: No tingling, numbness, weakness.  PSYCHIATRY: No anxiety or depression.   DRUG ALLERGIES:   Allergies  Allergen Reactions  . Cephalexin Itching  . Contrast Media [Iodinated Diagnostic Agents] Itching  . Doxycycline Itching  . Gabapentin Itching  . Sulfa Antibiotics Itching    VITALS:  Blood pressure (!) 145/54, pulse 60, temperature 98.4 F (36.9 C), temperature source Oral, resp. rate 16, height 5\' 7"  (1.702 m), weight 74.6 kg, SpO2 100 %.  PHYSICAL EXAMINATION:  GENERAL:  78 y.o.-year-old patient lying in the bed with no acute distress.  EYES: Pupils equal, round, reactive to light and accommodation. No scleral icterus. Extraocular muscles intact.  HEENT: Head atraumatic, normocephalic. Oropharynx and nasopharynx clear.  NECK:  Supple, no jugular venous distention. No thyroid enlargement, no tenderness.  LUNGS: Moderate breath sounds bilaterally, no wheezing,  crepitation.  Minimal rales and rhonchi bilaterally at the bases, no use of accessory muscles of respiration.  CARDIOVASCULAR: S1, S2 normal. No murmurs, rubs, or gallops.  ABDOMEN: Soft,  nontender, nondistended. Bowel sounds present.  EXTREMITIES: No pedal edema, cyanosis, or clubbing.  NEUROLOGIC: Awake, alert and oriented x3 sensation intact. Gait not checked.  PSYCHIATRIC: The patient is alert and oriented x 3.  SKIN: No obvious rash, lesion, or ulcer.    LABORATORY PANEL:   CBC Recent Labs  Lab 07/31/18 0458  WBC 8.1  HGB 10.8*  HCT 34.8*  PLT 314   ------------------------------------------------------------------------------------------------------------------  Chemistries  Recent Labs  Lab 07/30/18 0458 07/31/18 0458  NA 143 140  K 3.1* 4.2  CL 108 107  CO2 28 26  GLUCOSE 67* 226*  BUN 21 20  CREATININE 1.27* 1.17*  CALCIUM 8.9 8.8*  AST 16  --   ALT 12  --   ALKPHOS 113  --   BILITOT 0.5  --    ------------------------------------------------------------------------------------------------------------------  Cardiac Enzymes Recent Labs  Lab 07/30/18 0458  TROPONINI 0.05*   ------------------------------------------------------------------------------------------------------------------  RADIOLOGY:  Dg Chest 2 View  Result Date: 07/31/2018 CLINICAL DATA:  Recurrent right pleural effusion. Pulmonary sarcoidosis. Respiratory failure. EXAM: CHEST - 2 VIEW COMPARISON:  07/30/2018 FINDINGS: Lungs are somewhat hypoinflated demonstrate persistent right base opacification without significant change likely small to moderate size effusion with associated basilar atelectasis. Mild stable cardiomegaly. Remainder of the exam is unchanged. IMPRESSION: Stable small to moderate size right pleural effusion likely with associated basilar atelectasis. Mild stable cardiomegaly. Electronically Signed   By: Marin Olp M.D.   On: 07/31/2018 09:26   Dg Chest 2 View  Result Date: 07/30/2018 CLINICAL DATA:  Chest pain EXAM: CHEST - 2 VIEW COMPARISON:  06/07/2018 FINDINGS: Normal heart size and stable mediastinal contours. Dense right lower lobe airspace  disease. Right middle lobe airspace  disease with upward bowing of the minor fissure. Generalized interstitial coarsening. There may be pleural fluid on the right. No pneumothorax. IMPRESSION: Right middle and lower lobe consolidation. Electronically Signed   By: Monte Fantasia M.D.   On: 07/30/2018 05:18   Ct Chest Wo Contrast  Result Date: 07/30/2018 CLINICAL DATA:  Initial evaluation for back pain. History of COPD, cardiac stent. EXAM: CT CHEST WITHOUT CONTRAST CT THORACIC SPINE WITHOUT CONTRAST TECHNIQUE: Multidetector CT imaging of the chest was performed following the standard protocol without IV contrast. COMPARISON:  Comparison made with prior radiograph from earlier the same day as well as prior chest CT from 06/05/2018. FINDINGS: Cardiovascular: Intrathoracic aorta normal in caliber without aneurysm or visible acute finding on this noncontrast examination. Moderate atherosclerosis. Visualized great vessels normal in caliber without obvious abnormality. Cardiomegaly with 3 vessel coronary artery calcifications. No pericardial effusion. Main pulmonary artery dilated up to 3.1 cm, suggesting chronic underlying pulmonary hypertension, similar to previous. Mediastinum/Nodes: Thyroid normal. Multiple predominantly subcentimeter calcified lymph nodes seen throughout the mediastinum and bilateral hila, compatible with prior granulomatous infection. No enlarged axillary nodes. Overall, appearance is stable from previous. No new or concerning adenopathy. Trace layering secretions noted within the esophageal lumen. Esophagus otherwise unremarkable. Lungs/Pleura: Tracheobronchial tree intact and patent. Large layering right pleural effusion with associated sub pulmonic component. Small layering left effusion present as well. Associated consolidative opacities present within the right middle and lower lobes. Mild hazy opacities within the posterior left upper and lower lobes favored to reflect atelectasis and/or  mild congestion. No pneumothorax. 5 mm right lower lobe nodule stable from previous, likely related to prior granulomatous disease. No other new worrisome pulmonary nodule or mass. Upper Abdomen: Visualized upper abdomen demonstrates no acute finding. Adrenal glands are somewhat diffusely enlarged bilaterally, which could reflect underlying adrenal hyperplasia, stable from previous. Scattered prominent gastrohepatic, porta hepatis, and aortocaval lymph nodes noted, again likely related to prior granulomatous infection. Few punctate calcified granulomas noted within the spleen. Musculoskeletal: External soft tissues demonstrate no acute finding. No acute osseous abnormality. No discrete lytic or blastic osseous lesions. Remotely healed fracture of the left posterolateral sixth rib noted. Visualized external soft tissues demonstrate no acute finding. CT THORACIC SPINE Alignment: Vertebral bodies normally aligned with preservation of the normal thoracic kyphosis. No listhesis or malalignment. Vertebrae: Cervical ACDF partially visualized. Vertebral body heights well maintained without evidence for acute or chronic fracture. No discrete lytic or blastic osseous lesions. Visualized ribs intact and normal. Paraspinal and other soft tissues: Paraspinous soft tissues demonstrate no acute finding. Disc levels: Multilevel degenerative endplate spurring seen throughout the thoracic spine, most notable within the mid and lower aspect. Mild scattered posterior element hypertrophy, most notable at T10-11. No significant disc pathology. No significant spinal stenosis or significant bony foraminal narrowing. IMPRESSION: CT CHEST IMPRESSION: 1. Large right with small left layering pleural effusions. Associated consolidative opacity involving the right middle and lower lobes felt to be most consistent with compressive atelectasis, although superimposed infiltrate could be considered in the correct clinical setting. 2. Sequelae of  chronic granulomatous disease with multiple calcified mediastinal and hilar adenopathy, with calcified granulomata of the spleen. 3. 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 4. Cardiomegaly with diffuse 3 vessel coronary artery calcifications with moderate aortic atherosclerosis. 5. Cardiomegaly with 3 vessel coronary artery calcifications. 6. Mild thickening of the adrenal glands bilaterally, which could reflect an underlying degree of  adrenal hyperplasia. CT THORACIC SPINE IMPRESSION: 1. No fracture or other acute abnormality within the thoracic spine. 2. Moderate chronic endplate degenerative spurring throughout the thoracic spine. No appreciable significant disc pathology or stenosis. 3. Mild multilevel facet hypertrophy, most notable at T10-11, which could contribute to underlying back pain. Electronically Signed   By: Jeannine Boga M.D.   On: 07/30/2018 06:54   Ct T-spine No Charge  Result Date: 07/30/2018 CLINICAL DATA:  Initial evaluation for back pain. History of COPD, cardiac stent. EXAM: CT CHEST WITHOUT CONTRAST CT THORACIC SPINE WITHOUT CONTRAST TECHNIQUE: Multidetector CT imaging of the chest was performed following the standard protocol without IV contrast. COMPARISON:  Comparison made with prior radiograph from earlier the same day as well as prior chest CT from 06/05/2018. FINDINGS: Cardiovascular: Intrathoracic aorta normal in caliber without aneurysm or visible acute finding on this noncontrast examination. Moderate atherosclerosis. Visualized great vessels normal in caliber without obvious abnormality. Cardiomegaly with 3 vessel coronary artery calcifications. No pericardial effusion. Main pulmonary artery dilated up to 3.1 cm, suggesting chronic underlying pulmonary hypertension, similar to previous. Mediastinum/Nodes: Thyroid normal. Multiple predominantly subcentimeter  calcified lymph nodes seen throughout the mediastinum and bilateral hila, compatible with prior granulomatous infection. No enlarged axillary nodes. Overall, appearance is stable from previous. No new or concerning adenopathy. Trace layering secretions noted within the esophageal lumen. Esophagus otherwise unremarkable. Lungs/Pleura: Tracheobronchial tree intact and patent. Large layering right pleural effusion with associated sub pulmonic component. Small layering left effusion present as well. Associated consolidative opacities present within the right middle and lower lobes. Mild hazy opacities within the posterior left upper and lower lobes favored to reflect atelectasis and/or mild congestion. No pneumothorax. 5 mm right lower lobe nodule stable from previous, likely related to prior granulomatous disease. No other new worrisome pulmonary nodule or mass. Upper Abdomen: Visualized upper abdomen demonstrates no acute finding. Adrenal glands are somewhat diffusely enlarged bilaterally, which could reflect underlying adrenal hyperplasia, stable from previous. Scattered prominent gastrohepatic, porta hepatis, and aortocaval lymph nodes noted, again likely related to prior granulomatous infection. Few punctate calcified granulomas noted within the spleen. Musculoskeletal: External soft tissues demonstrate no acute finding. No acute osseous abnormality. No discrete lytic or blastic osseous lesions. Remotely healed fracture of the left posterolateral sixth rib noted. Visualized external soft tissues demonstrate no acute finding. CT THORACIC SPINE Alignment: Vertebral bodies normally aligned with preservation of the normal thoracic kyphosis. No listhesis or malalignment. Vertebrae: Cervical ACDF partially visualized. Vertebral body heights well maintained without evidence for acute or chronic fracture. No discrete lytic or blastic osseous lesions. Visualized ribs intact and normal. Paraspinal and other soft tissues:  Paraspinous soft tissues demonstrate no acute finding. Disc levels: Multilevel degenerative endplate spurring seen throughout the thoracic spine, most notable within the mid and lower aspect. Mild scattered posterior element hypertrophy, most notable at T10-11. No significant disc pathology. No significant spinal stenosis or significant bony foraminal narrowing. IMPRESSION: CT CHEST IMPRESSION: 1. Large right with small left layering pleural effusions. Associated consolidative opacity involving the right middle and lower lobes felt to be most consistent with compressive atelectasis, although superimposed infiltrate could be considered in the correct clinical setting. 2. Sequelae of chronic granulomatous disease with multiple calcified mediastinal and hilar adenopathy, with calcified granulomata of the spleen. 3. 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 4. Cardiomegaly with diffuse 3 vessel coronary artery calcifications with moderate aortic atherosclerosis. 5.  Cardiomegaly with 3 vessel coronary artery calcifications. 6. Mild thickening of the adrenal glands bilaterally, which could reflect an underlying degree of adrenal hyperplasia. CT THORACIC SPINE IMPRESSION: 1. No fracture or other acute abnormality within the thoracic spine. 2. Moderate chronic endplate degenerative spurring throughout the thoracic spine. No appreciable significant disc pathology or stenosis. 3. Mild multilevel facet hypertrophy, most notable at T10-11, which could contribute to underlying back pain. Electronically Signed   By: Jeannine Boga M.D.   On: 07/30/2018 06:54   Dg Chest Port 1 View  Result Date: 07/30/2018 CLINICAL DATA:  Follow-up thoracentesis EXAM: PORTABLE CHEST 1 VIEW COMPARISON:  Earlier same day FINDINGS: There is less pleural fluid on the right. No pneumothorax. Some pleural fluid does persist with  basilar volume loss. Left chest remains clear. Pulmonary venous hypertension again noted IMPRESSION: Less pleural fluid on the right following thoracentesis, with less right lung atelectasis. No pneumothorax. Electronically Signed   By: Nelson Chimes M.D.   On: 07/30/2018 12:37   US Thoracentesis Asp Pleural Space W/img Guide  Result Date: 07/30/2018 INDICATION: Right effusion EXAM: ULTRASOUND GUIDED RIGHT THORACENTESIS MEDICATIONS: None. COMPLICATIONS: None immediate. PROCEDURE: An ultrasound guided thoracentesis was thoroughly discussed with the patient and questions answered. The benefits, risks, alternatives and complications were also discussed. The patient understands and wishes to proceed with the procedure. Written consent was obtained. Ultrasound was performed to localize and mark an adequate pocket of fluid in the right chest. The area was then prepped and draped in the normal sterile fashion. 1% Lidocaine was used for local anesthesia. Under ultrasound guidance a Safe-T-Centesis catheter was introduced. Thoracentesis was performed. The catheter was removed and a dressing applied. FINDINGS: A total of approximately 720 mL of clear yellow fluid was removed. Samples were sent to the laboratory as requested by the clinical team. IMPRESSION: Successful ultrasound guided right thoracentesis yielding 720 mL of pleural fluid. No pneumothorax on follow-up radiograph. Electronically Signed   By: Lucrezia Europe M.D.   On: 07/30/2018 12:49    EKG:   Orders placed or performed during the hospital encounter of 07/30/18  . ED EKG  . ED EKG  . EKG 12-Lead  . EKG 12-Lead  . EKG 12-Lead  . EKG 12-Lead   *Note: Due to a large number of results and/or encounters for the requested time period, some results have not been displayed. A complete set of results can be found in Results Review.    ASSESSMENT AND PLAN:    *Bilateral pleural effusion with large on the right, due to CHF. Patient clinically is feeling  better Repeat chest x-ray with a small to moderate size right-sided pleural effusion IR guided thoracentesis advised if no clinical improvement CRP less than 0.8 Continue IV Lasix  *Acute on chronic diastolic congestive heart failure IV Lasix, monitor intake and output, recent echocardiogram is done.   Cardiology consult with [facility]  *Coronary artery disease status post stent in the past Continue aspirin, Brilinta, statin, beta-blocker.  *Essential hypertension Continue home medications.  *Hypokalemia Potassium at 4.8 continue close monitoring while patient is on IV Lasix  *Diabetes mellitus type 2 Continue home dose of insulin and keep on sliding scale coverage.  *Stage III chronic kidney disease Follow renal function with diuretics. Creatinine 1.27-1.17  All the records are reviewed and case discussed with ED provider. Management plans discussed with the patient, family and they are in agreement.    All the records are reviewed and case discussed with Care Management/Social Workerr. Management  plans discussed with the patient, she is  in agreement.  CODE STATUS: FC  TOTAL TIME TAKING CARE OF THIS PATIENT: 35  minutes.   POSSIBLE D/C IN  1-2 DAYS, DEPENDING ON CLINICAL CONDITION.  Note: This dictation was prepared with Dragon dictation along with smaller phrase technology. Any transcriptional errors that result from this process are unintentional.   Nicholes Mango M.D on 07/31/2018 at 12:23 PM  Between 7am to 6pm - Pager - [phone] After 6pm go to [email or web address] - password [email or web address] [facility]  Office  [phone]  CC: Primary care physician; [name], MD

## 2018-07-31 NOTE — Plan of Care (Signed)
No complaints of pain BM last night 07/30/2018. On room air, slept with CPAP most of night Problem: Clinical Measurements: Goal: Respiratory complications will improve Outcome: Progressing   Problem: Education: Goal: Knowledge of General Education information will improve Description Including pain rating scale, medication(s)/side effects and non-pharmacologic comfort measures Outcome: Completed/Met

## 2018-07-31 NOTE — Progress Notes (Signed)
Progress Note  Patient [name] Date of Encounter: 07/31/2018  Primary Cardiologist: Ida Rogue, MD   Subjective    Thoracentesis on arrival for pleural effusion Reports that she has been taking Lasix 3 days a week (some confusion on prior discharge summary January 2020 was supposed to take Lasix daily) Does not weigh herself, has large water intake daily " Mouth gets dry" Pills, and pill pack.  No family locally to help support her or monitor her health issues Reports she is moving in May or June up to [place]   Inpatient Medications    Scheduled Meds:  allopurinol  100 mg Oral Daily   amLODipine  10 mg Oral Daily   aspirin EC  81 mg Oral Daily   dicyclomine  10 mg Oral TID AC & HS   donepezil  10 mg Oral QHS   furosemide  20 mg Intravenous BID   guaiFENesin  600 mg Oral BID   heparin  5,000 Units Subcutaneous Q8H   hydrALAZINE  25 mg Oral TID   insulin aspart protamine- aspart  20 Units Subcutaneous BID AC   isosorbide mononitrate  30 mg Oral Daily   potassium chloride  40 mEq Oral BID   pregabalin  100 mg Oral BID   propranolol  10 mg Oral TID   rosuvastatin  20 mg Oral Daily   ticagrelor  60 mg Oral BID   Continuous Infusions:  PRN Meds: acetaminophen, ALPRAZolam, diphenoxylate-atropine, docusate sodium, guaiFENesin-dextromethorphan, ipratropium-albuterol, ondansetron, senna   Vital Signs    Vitals:   07/30/18 1552 07/30/18 1958 07/31/18 0442 07/31/18 0801  BP: (!) 144/51 (!) 129/53 (!) 147/72 (!) 145/54  Pulse: 60 63 62 60  Resp: 18 18 16    Temp: 98.8 F (37.1 C) 99 F (37.2 C) 98.6 F (37 C) 98.4 F (36.9 C)  TempSrc: Oral Oral Oral Oral  SpO2: 96% 98% 99% 100%  Weight:   74.6 kg   Height:        Intake/Output Summary (Last 24 hours) at 07/31/2018 0948 Last data filed at 07/31/2018 0442 Gross per 24 hour  Intake 480 ml  Output 2650 ml  Net -2170 ml   Last 3 Weights 07/31/2018 07/30/2018 07/30/2018  Weight  (lbs) 164 lb 6.4 oz 171 lb 8 oz 174 lb 2.6 oz  Weight (kg) 74.571 kg 77.792 kg 79 kg      Telemetry    Normal sinus rhythm- Personally Reviewed  ECG     - Personally Reviewed  Physical Exam   GEN: No acute distress.   Neck:  JVD 10+ Cardiac: RRR, no murmurs, rubs, or gallops.  Respiratory: Clear to auscultation bilaterally, with dullness halfway up on the right base GI: Soft, nontender, non-distended  MS: No edema; No deformity. Neuro:  Nonfocal  Psych: Normal affect   Labs    Chemistry Recent Labs  Lab 07/30/18 0458 07/31/18 0458  NA 143 140  K 3.1* 4.2  CL 108 107  CO2 28 26  GLUCOSE 67* 226*  BUN 21 20  CREATININE 1.27* 1.17*  CALCIUM 8.9 8.8*  PROT 6.8  --   ALBUMIN 3.3*  --   AST 16  --   ALT 12  --   ALKPHOS 113  --   BILITOT 0.5  --   GFRNONAA 40* 45*  GFRAA 47* 52*  ANIONGAP 7 7     Hematology Recent Labs  Lab 07/30/18 0458 07/31/18 0458  WBC 10.8*   10.9* 8.1  RBC 3.92   3.93 3.88  HGB 11.1*   11.0* 10.8*  HCT 34.9*   34.9* 34.8*  MCV 89.0   88.8 89.7  MCH 28.3   28.0 27.8  MCHC 31.8   31.5 31.0  RDW 16.2*   16.1* 16.5*  PLT 376   366 314    Cardiac Enzymes Recent Labs  Lab 07/30/18 0458  TROPONINI 0.05*   No results for input(s): TROPIPOC in the last 168 hours.   BNP Recent Labs  Lab 07/30/18 0458  BNP 391.0*     DDimer No results for input(s): DDIMER in the last 168 hours.   Radiology    Dg Chest 2 View  Result Date: 07/31/2018 CLINICAL DATA:  Recurrent right pleural effusion. Pulmonary sarcoidosis. Respiratory failure. EXAM: CHEST - 2 VIEW COMPARISON:  07/30/2018 FINDINGS: Lungs are somewhat hypoinflated demonstrate persistent right base opacification without significant change likely small to moderate size effusion with associated basilar atelectasis. Mild stable cardiomegaly. Remainder of the exam is unchanged. IMPRESSION: Stable small to moderate size right pleural effusion likely with associated basilar atelectasis.  Mild stable cardiomegaly. Electronically Signed   By: Marin Olp M.D.   On: 07/31/2018 09:26   Dg Chest 2 View  Result Date: 07/30/2018 CLINICAL DATA:  Chest pain EXAM: CHEST - 2 VIEW COMPARISON:  06/07/2018 FINDINGS: Normal heart size and stable mediastinal contours. Dense right lower lobe airspace disease. Right middle lobe airspace disease with upward bowing of the minor fissure. Generalized interstitial coarsening. There may be pleural fluid on the right. No pneumothorax. IMPRESSION: Right middle and lower lobe consolidation. Electronically Signed   By: Monte Fantasia M.D.   On: 07/30/2018 05:18   Ct Chest Wo Contrast  Result Date: 07/30/2018 CLINICAL DATA:  Initial evaluation for back pain. History of COPD, cardiac stent. EXAM: CT CHEST WITHOUT CONTRAST CT THORACIC SPINE WITHOUT CONTRAST TECHNIQUE: Multidetector CT imaging of the chest was performed following the standard protocol without IV contrast. COMPARISON:  Comparison made with prior radiograph from earlier the same day as well as prior chest CT from 06/05/2018. FINDINGS: Cardiovascular: Intrathoracic aorta normal in caliber without aneurysm or visible acute finding on this noncontrast examination. Moderate atherosclerosis. Visualized great vessels normal in caliber without obvious abnormality. Cardiomegaly with 3 vessel coronary artery calcifications. No pericardial effusion. Main pulmonary artery dilated up to 3.1 cm, suggesting chronic underlying pulmonary hypertension, similar to previous. Mediastinum/Nodes: Thyroid normal. Multiple predominantly subcentimeter calcified lymph nodes seen throughout the mediastinum and bilateral hila, compatible with prior granulomatous infection. No enlarged axillary nodes. Overall, appearance is stable from previous. No new or concerning adenopathy. Trace layering secretions noted within the esophageal lumen. Esophagus otherwise unremarkable. Lungs/Pleura: Tracheobronchial tree intact and patent. Large  layering right pleural effusion with associated sub pulmonic component. Small layering left effusion present as well. Associated consolidative opacities present within the right middle and lower lobes. Mild hazy opacities within the posterior left upper and lower lobes favored to reflect atelectasis and/or mild congestion. No pneumothorax. 5 mm right lower lobe nodule stable from previous, likely related to prior granulomatous disease. No other new worrisome pulmonary nodule or mass. Upper Abdomen: Visualized upper abdomen demonstrates no acute finding. Adrenal glands are somewhat diffusely enlarged bilaterally, which could reflect underlying adrenal hyperplasia, stable from previous. Scattered prominent gastrohepatic, porta hepatis, and aortocaval lymph nodes noted, again likely related to prior granulomatous infection. Few punctate calcified granulomas noted within the spleen. Musculoskeletal: External soft tissues demonstrate no acute finding. No acute osseous abnormality. No discrete  lytic or blastic osseous lesions. Remotely healed fracture of the left posterolateral sixth rib noted. Visualized external soft tissues demonstrate no acute finding. CT THORACIC SPINE Alignment: Vertebral bodies normally aligned with preservation of the normal thoracic kyphosis. No listhesis or malalignment. Vertebrae: Cervical ACDF partially visualized. Vertebral body heights well maintained without evidence for acute or chronic fracture. No discrete lytic or blastic osseous lesions. Visualized ribs intact and normal. Paraspinal and other soft tissues: Paraspinous soft tissues demonstrate no acute finding. Disc levels: Multilevel degenerative endplate spurring seen throughout the thoracic spine, most notable within the mid and lower aspect. Mild scattered posterior element hypertrophy, most notable at T10-11. No significant disc pathology. No significant spinal stenosis or significant bony foraminal narrowing. IMPRESSION: CT CHEST  IMPRESSION: 1. Large right with small left layering pleural effusions. Associated consolidative opacity involving the right middle and lower lobes felt to be most consistent with compressive atelectasis, although superimposed infiltrate could be considered in the correct clinical setting. 2. Sequelae of chronic granulomatous disease with multiple calcified mediastinal and hilar adenopathy, with calcified granulomata of the spleen. 3. 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 4. Cardiomegaly with diffuse 3 vessel coronary artery calcifications with moderate aortic atherosclerosis. 5. Cardiomegaly with 3 vessel coronary artery calcifications. 6. Mild thickening of the adrenal glands bilaterally, which could reflect an underlying degree of adrenal hyperplasia. CT THORACIC SPINE IMPRESSION: 1. No fracture or other acute abnormality within the thoracic spine. 2. Moderate chronic endplate degenerative spurring throughout the thoracic spine. No appreciable significant disc pathology or stenosis. 3. Mild multilevel facet hypertrophy, most notable at T10-11, which could contribute to underlying back pain. Electronically Signed   By: Jeannine Boga M.D.   On: 07/30/2018 06:54   Ct T-spine No Charge  Result Date: 07/30/2018 CLINICAL DATA:  Initial evaluation for back pain. History of COPD, cardiac stent. EXAM: CT CHEST WITHOUT CONTRAST CT THORACIC SPINE WITHOUT CONTRAST TECHNIQUE: Multidetector CT imaging of the chest was performed following the standard protocol without IV contrast. COMPARISON:  Comparison made with prior radiograph from earlier the same day as well as prior chest CT from 06/05/2018. FINDINGS: Cardiovascular: Intrathoracic aorta normal in caliber without aneurysm or visible acute finding on this noncontrast examination. Moderate atherosclerosis. Visualized great vessels normal in caliber  without obvious abnormality. Cardiomegaly with 3 vessel coronary artery calcifications. No pericardial effusion. Main pulmonary artery dilated up to 3.1 cm, suggesting chronic underlying pulmonary hypertension, similar to previous. Mediastinum/Nodes: Thyroid normal. Multiple predominantly subcentimeter calcified lymph nodes seen throughout the mediastinum and bilateral hila, compatible with prior granulomatous infection. No enlarged axillary nodes. Overall, appearance is stable from previous. No new or concerning adenopathy. Trace layering secretions noted within the esophageal lumen. Esophagus otherwise unremarkable. Lungs/Pleura: Tracheobronchial tree intact and patent. Large layering right pleural effusion with associated sub pulmonic component. Small layering left effusion present as well. Associated consolidative opacities present within the right middle and lower lobes. Mild hazy opacities within the posterior left upper and lower lobes favored to reflect atelectasis and/or mild congestion. No pneumothorax. 5 mm right lower lobe nodule stable from previous, likely related to prior granulomatous disease. No other new worrisome pulmonary nodule or mass. Upper Abdomen: Visualized upper abdomen demonstrates no acute finding. Adrenal glands are somewhat diffusely enlarged bilaterally, which could reflect underlying adrenal hyperplasia, stable from previous. Scattered prominent gastrohepatic, porta hepatis, and aortocaval lymph nodes noted, again likely related to prior granulomatous infection.  Few punctate calcified granulomas noted within the spleen. Musculoskeletal: External soft tissues demonstrate no acute finding. No acute osseous abnormality. No discrete lytic or blastic osseous lesions. Remotely healed fracture of the left posterolateral sixth rib noted. Visualized external soft tissues demonstrate no acute finding. CT THORACIC SPINE Alignment: Vertebral bodies normally aligned with preservation of the  normal thoracic kyphosis. No listhesis or malalignment. Vertebrae: Cervical ACDF partially visualized. Vertebral body heights well maintained without evidence for acute or chronic fracture. No discrete lytic or blastic osseous lesions. Visualized ribs intact and normal. Paraspinal and other soft tissues: Paraspinous soft tissues demonstrate no acute finding. Disc levels: Multilevel degenerative endplate spurring seen throughout the thoracic spine, most notable within the mid and lower aspect. Mild scattered posterior element hypertrophy, most notable at T10-11. No significant disc pathology. No significant spinal stenosis or significant bony foraminal narrowing. IMPRESSION: CT CHEST IMPRESSION: 1. Large right with small left layering pleural effusions. Associated consolidative opacity involving the right middle and lower lobes felt to be most consistent with compressive atelectasis, although superimposed infiltrate could be considered in the correct clinical setting. 2. Sequelae of chronic granulomatous disease with multiple calcified mediastinal and hilar adenopathy, with calcified granulomata of the spleen. 3. 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 4. Cardiomegaly with diffuse 3 vessel coronary artery calcifications with moderate aortic atherosclerosis. 5. Cardiomegaly with 3 vessel coronary artery calcifications. 6. Mild thickening of the adrenal glands bilaterally, which could reflect an underlying degree of adrenal hyperplasia. CT THORACIC SPINE IMPRESSION: 1. No fracture or other acute abnormality within the thoracic spine. 2. Moderate chronic endplate degenerative spurring throughout the thoracic spine. No appreciable significant disc pathology or stenosis. 3. Mild multilevel facet hypertrophy, most notable at T10-11, which could contribute to underlying back pain. Electronically Signed   By:  Jeannine Boga M.D.   On: 07/30/2018 06:54   Dg Chest Port 1 View  Result Date: 07/30/2018 CLINICAL DATA:  Follow-up thoracentesis EXAM: PORTABLE CHEST 1 VIEW COMPARISON:  Earlier same day FINDINGS: There is less pleural fluid on the right. No pneumothorax. Some pleural fluid does persist with basilar volume loss. Left chest remains clear. Pulmonary venous hypertension again noted IMPRESSION: Less pleural fluid on the right following thoracentesis, with less right lung atelectasis. No pneumothorax. Electronically Signed   By: Nelson Chimes M.D.   On: 07/30/2018 12:37   US Thoracentesis Asp Pleural Space W/img Guide  Result Date: 07/30/2018 INDICATION: Right effusion EXAM: ULTRASOUND GUIDED RIGHT THORACENTESIS MEDICATIONS: None. COMPLICATIONS: None immediate. PROCEDURE: An ultrasound guided thoracentesis was thoroughly discussed with the patient and questions answered. The benefits, risks, alternatives and complications were also discussed. The patient understands and wishes to proceed with the procedure. Written consent was obtained. Ultrasound was performed to localize and mark an adequate pocket of fluid in the right chest. The area was then prepped and draped in the normal sterile fashion. 1% Lidocaine was used for local anesthesia. Under ultrasound guidance a Safe-T-Centesis catheter was introduced. Thoracentesis was performed. The catheter was removed and a dressing applied. FINDINGS: A total of approximately 720 mL of clear yellow fluid was removed. Samples were sent to the laboratory as requested by the clinical team. IMPRESSION: Successful ultrasound guided right thoracentesis yielding 720 mL of pleural fluid. No pneumothorax on follow-up radiograph. Electronically Signed   By: Lucrezia Europe M.D.   On: 07/30/2018 12:49    Cardiac Studies  Patient Profile     78 y.o. female with long history of diastolic heart failure, pleural effusions dating back to 2016 requiring diuretics,    Assessment & Plan    1) acute on chronic diastolic heart failure Recent hospital admission January 2020 for similar circumstances In the past has been somewhat difficult to manage given periodic diarrhea Was discharged June 08, 2018 on Lasix 20 mg daily (confusing instructions as it stated take daily but then only on Monday Wednesday Friday) .  She has been taking it 3 days a week -BNP elevated close to 400 -CT scan with dilated pulmonary artery consistent with pulmonary hypertension -Would continue 1 more day Lasix 20 IV twice daily --10 would likely need Lasix 20 daily at discharge (hold a day or 2 for outpatient diarrhea) --We will need to get family involved to live in [place].  Needs daily weights Recommend she write down daily weights and call our office but unclear if she will be able to do this --Recommend she decrease her water intake which is high  2) right-sided pleural effusion Secondary to acute on chronic diastolic CHF Similar symptoms dating back to 2016 with periodic flareups, most recently January 2020 Improved shortness of breath after thoracentesis  3) CKD stage III- Numbers are stable Lasix 20 IV twice daily 1 more day then changed to Lasix 20 daily  4. CAD-stable:  Known disease with stent to the LAD, circumflex, RCA, numerous previous cardiac catheterizations in 2010, 2011, 2013, 2016 Prior history atypical chest pain No ischemic work-up needed at this time  5.   Essential hypertension:  Blood pressure is well controlled on today's visit. No changes made to the medications.   With nurse in the room, long CHF discussion with her Recommended she decrease her water intake which is excessive Avoid foods high in salt.  She tends to live out of the freezer Needs daily weights but reports having difficulty writing anything down, husband would likely not be able to help as he is elderly May need family involved with a live in [place]   Total  encounter time more than 45 minutes  Greater than 50% was spent in counseling and coordination of care with the patient   For questions or updates, please contact [place] Please consult [email or web address] for contact info under        Signed, Ida Rogue, MD  07/31/2018, 9:48 AM

## 2018-08-01 DIAGNOSIS — R0601 Orthopnea: Secondary | ICD-10-CM

## 2018-08-01 DIAGNOSIS — E440 Moderate protein-calorie malnutrition: Secondary | ICD-10-CM

## 2018-08-01 LAB — GLUCOSE, CAPILLARY
Glucose-Capillary: 162 mg/dL — ABNORMAL HIGH (ref 70–99)
Glucose-Capillary: 190 mg/dL — ABNORMAL HIGH (ref 70–99)

## 2018-08-01 MED ORDER — FUROSEMIDE 20 MG PO TABS: 20.0000 mg | ORAL_TABLET | Freq: Every day | ORAL | 0 refills | Status: DC

## 2018-08-01 MED ORDER — GLUCERNA SHAKE PO LIQD: 237.0000 mL | Freq: Two times a day (BID) | ORAL | 0 refills | Status: AC

## 2018-08-01 MED ORDER — DICYCLOMINE HCL 10 MG PO CAPS: 10.0000 mg | ORAL_CAPSULE | Freq: Three times a day (TID) | ORAL | 0 refills | Status: DC

## 2018-08-01 MED ORDER — POTASSIUM CHLORIDE ER 20 MEQ PO TBCR: 10.0000 meq | EXTENDED_RELEASE_TABLET | Freq: Every day | ORAL | 0 refills | Status: DC

## 2018-08-01 MED ORDER — GLUCERNA SHAKE PO LIQD: 237.0000 mL | Freq: Two times a day (BID) | ORAL | Status: DC

## 2018-08-01 MED ORDER — FUROSEMIDE 20 MG PO TABS: 20.0000 mg | ORAL_TABLET | ORAL | 0 refills | Status: DC | PRN

## 2018-08-01 MED ORDER — FUROSEMIDE 20 MG PO TABS: 20.0000 mg | ORAL_TABLET | Freq: Every day | ORAL | Status: DC

## 2018-08-01 NOTE — Progress Notes (Signed)
Progress Note  Patient [name] Date of Encounter: 08/01/2018  Primary Cardiologist: Ida Rogue, MD   Subjective   Thoracentesis on arrival for pleural effusion Taking Lasix every other day at home High fluid intake at home his mouth gets dry No family local to monitor her fluid intake weight Long discussion yesterday and today concerning her habits, fluid intake, salt intake  Today she reports that she feels relatively well, back to baseline 5.7 L negative this admission not including thoracentesis 2.1 L negative past 24 hours No BMP available this morning   Inpatient Medications    Scheduled Meds: . allopurinol  100 mg Oral Daily  . amLODipine  10 mg Oral Daily  . aspirin EC  81 mg Oral Daily  . dicyclomine  10 mg Oral TID AC & HS  . donepezil  10 mg Oral QHS  . feeding supplement (GLUCERNA SHAKE)  237 mL Oral BID BM  . furosemide  20 mg Intravenous BID  . [START ON 08/02/2018] furosemide  20 mg Oral Daily  . guaiFENesin  600 mg Oral BID  . heparin  5,000 Units Subcutaneous Q8H  . hydrALAZINE  25 mg Oral TID  . insulin aspart protamine- aspart  20 Units Subcutaneous BID AC  . isosorbide mononitrate  30 mg Oral Daily  . potassium chloride  40 mEq Oral BID  . pregabalin  100 mg Oral BID  . propranolol  10 mg Oral TID  . rosuvastatin  20 mg Oral Daily  . ticagrelor  60 mg Oral BID   Continuous Infusions:  PRN Meds: acetaminophen, ALPRAZolam, diphenoxylate-atropine, docusate sodium, guaiFENesin-dextromethorphan, ipratropium-albuterol, ondansetron, senna   Vital Signs    Vitals:   07/31/18 1617 07/31/18 2115 08/01/18 0307 08/01/18 0931  BP: (!) 132/56 (!) 126/57 (!) 144/56   Pulse: 64 61 (!) 59 62  Resp:  18 18   Temp: 98.6 F (37 C) 98.9 F (37.2 C) 98.1 F (36.7 C)   TempSrc: Oral Oral Oral   SpO2: 97% 98% 100%   Weight:   74.1 kg   Height:        Intake/Output Summary (Last 24 hours) at 08/01/2018 1303 Last data filed at 08/01/2018  1030 Gross per 24 hour  Intake -  Output 1800 ml  Net -1800 ml   Last 3 Weights 08/01/2018 07/31/2018 07/30/2018  Weight (lbs) 163 lb 6.4 oz 164 lb 6.4 oz 171 lb 8 oz  Weight (kg) 74.118 kg 74.571 kg 77.792 kg      Telemetry    Normal sinus rhythm- Personally Reviewed  ECG     - Personally Reviewed  Physical Exam   GEN: No acute distress.   Neck:  JVD 8 Cardiac: RRR, no murmurs, rubs, or gallops.  Respiratory: Clear to auscultation bilaterally, dullness right base quarter of the way up GI: Soft, nontender, non-distended  MS: No edema; No deformity. Neuro:  Nonfocal  Psych: Normal affect   Labs    Chemistry Recent Labs  Lab 07/30/18 0458 07/31/18 0458  NA 143 140  K 3.1* 4.2  CL 108 107  CO2 28 26  GLUCOSE 67* 226*  BUN 21 20  CREATININE 1.27* 1.17*  CALCIUM 8.9 8.8*  PROT 6.8  --   ALBUMIN 3.3*  --   AST 16  --   ALT 12  --   ALKPHOS 113  --   BILITOT 0.5  --   GFRNONAA 40* 45*  GFRAA 47* 52*  ANIONGAP 7 7  Hematology Recent Labs  Lab 07/30/18 0458 07/31/18 0458  WBC 10.8*  10.9* 8.1  RBC 3.92  3.93 3.88  HGB 11.1*  11.0* 10.8*  HCT 34.9*  34.9* 34.8*  MCV 89.0  88.8 89.7  MCH 28.3  28.0 27.8  MCHC 31.8  31.5 31.0  RDW 16.2*  16.1* 16.5*  PLT 376  366 314    Cardiac Enzymes Recent Labs  Lab 07/30/18 0458  TROPONINI 0.05*   No results for input(s): TROPIPOC in the last 168 hours.   BNP Recent Labs  Lab 07/30/18 0458  BNP 391.0*     DDimer No results for input(s): DDIMER in the last 168 hours.   Radiology    Dg Chest 2 View  Result Date: 07/31/2018 CLINICAL DATA:  Recurrent right pleural effusion. Pulmonary sarcoidosis. Respiratory failure. EXAM: CHEST - 2 VIEW COMPARISON:  07/30/2018 FINDINGS: Lungs are somewhat hypoinflated demonstrate persistent right base opacification without significant change likely small to moderate size effusion with associated basilar atelectasis. Mild stable cardiomegaly. Remainder of the  exam is unchanged. IMPRESSION: Stable small to moderate size right pleural effusion likely with associated basilar atelectasis. Mild stable cardiomegaly. Electronically Signed   By: Marin Olp M.D.   On: 07/31/2018 09:26    Cardiac Studies     Patient Profile     78 y.o. female with long history of diastolic heart failure, pleural effusions dating back to 2016 requiring diuretics,   Assessment & Plan    1) acute on chronic diastolic heart failure Recent hospital admission January 2020 for similar circumstances In the past has been somewhat difficult to manage given periodic diarrhea Was discharged June 08, 2018 on Lasix 20 mg every other day -Given high fluid intake at home with discharge on Lasix 20 daily with extra Lasix as needed Recommended she moderate her water intake She will need periodic monitoring for her weight We will set up a telemedicine call in the next week or 2  2) right-sided pleural effusion Secondary to acute on chronic diastolic CHF Similar symptoms dating back to 2016 with periodic flareups, most recently January 2020 -Status post thoracentesis --Close to 6 L negative this admission With discharge on Lasix 20 daily with extra Lasix x1 for 3 pound weight gain  3) CKD stage III- Numbers are stable, no labs today  Lasix 20 daily with extra Lasix as needed We will help arrange outpatient lab work  4. CAD Known disease with stent to the LAD, circumflex, RCA, numerous previous cardiac catheterizations in 2010, 2011, 2013, 2016 Prior history atypical chest pain No ischemic work-up needed at this time Stable  5.   Essential hypertension:  Blood pressure is well controlled on today's visit. No changes made to the medications. Stable   Total encounter time more than 25 minutes  Greater than 50% was spent in counseling and coordination of care with the patient   For questions or updates, please contact [place] Please consult [email or web address]  for contact info under        Signed, Ida Rogue, MD  08/01/2018, 1:03 PM

## 2018-08-01 NOTE — Plan of Care (Signed)
Nutrition Education Note  RD consulted for nutrition education regarding CHF.  RD provided "Heart Failure Nutrition Therapy" handout from the [employer]. Reviewed patient's dietary recall. Provided examples on ways to decrease sodium intake in diet. Discouraged intake of processed foods and use of salt shaker. Encouraged fresh fruits and vegetables as well as whole grain sources of carbohydrates to maximize fiber intake.   RD discussed why it is important for patient to adhere to diet recommendations, and emphasized the role of fluids, foods to avoid, and importance of weighing self daily. Teach back method used.  Expect good compliance.  Body mass index is 25.59 kg/m. Pt meets criteria for overweight based on current BMI.  Current diet order is heart healthy/carbohydrate modified, patient has been consuming 50-70% of meals. Patient will be followed by RD during admission in setting of moderate chronic malnutrition.  Willey Blade, MS, [place], LDN Office: [phone] Pager: [phone] After Hours/Weekend Pager: [phone]

## 2018-08-01 NOTE — Plan of Care (Signed)
  Problem: Clinical Measurements: Goal: Diagnostic test results will improve Outcome: Progressing Goal: Respiratory complications will improve Outcome: Progressing   Problem: Cardiac: Goal: Ability to achieve and maintain adequate cardiopulmonary perfusion will improve Outcome: Progressing

## 2018-08-01 NOTE — Progress Notes (Signed)
Initial Nutrition Assessment  DOCUMENTATION CODES:   Non-severe (moderate) malnutrition in context of chronic illness  INTERVENTION:  Provided education on low sodium diet for CHF (note to follow).  Provide Glucerna Shake po BID, each supplement provides 220 kcal and 10 grams of protein. Patient prefers strawberry or butter pecan.  Encouraged adequate intake of protein at meals.  NUTRITION DIAGNOSIS:   Moderate Malnutrition related to chronic illness(CHF, COPD, CKD) as evidenced by mild fat depletion, mild-moderate muscle depletion.  GOAL:   Patient will meet greater than or equal to 90% of their needs  MONITOR:   PO intake, Supplement acceptance, Labs, Weight trends, Skin, I & O's  REASON FOR ASSESSMENT:   Consult Diet education  ASSESSMENT:   78 year old female with PMHx of CAD, ischemic cardiomyopathy, chronic diastolic CHF, hepatic steatosis, PVD, DM, depression, anxiety, COPD, sleep apnea, CKD stage III admitted with bilateral pleural effusion, acute on chronic CHF.   Met with patient at bedside. She is known to our service from previous educations in patient and she has also received outpatient educations for heart failure and DM. Patient usually has a good appetite, but reports it has been decreased for at least 5-6 months now. Her husband tries to encourage her to eat but she cannot eat much. She has only bites from 2-3 meals per day. She has started drinking Glucerna at home now. Patient is very familiar with low sodium diet. She does not eat processed foods, does not cook with salt or use salt at the table, and eats mainly fresh or frozen foods prepared at home.  Patient reports her UBW was 183 lbs (83.2 kg). After diuresis she is now 74.1 kg (163.4 lbs). She has lost approximately 20 lbs (10.9% body weight) over an unknown time period. Weight history in chart appears unreliable, so unable to tell time frame of weight loss.  Medications reviewed and include:  allopurinol, Lasix 20 mg BID, Novolog 70/30 20 units BID, potassium chloride 40 mEq BID.  Labs reviewed: CBG 162-243, Creatinine 1.17.  NUTRITION - FOCUSED PHYSICAL EXAM:    Most Recent Value  Orbital Region  Mild depletion  Upper Arm Region  Moderate depletion  Thoracic and Lumbar Region  No depletion  Buccal Region  Mild depletion  Temple Region  Mild depletion  Clavicle Bone Region  Moderate depletion  Clavicle and Acromion Bone Region  Moderate depletion  Scapular Bone Region  Mild depletion  Dorsal Hand  Moderate depletion  Patellar Region  Moderate depletion  Anterior Thigh Region  Moderate depletion  Posterior Calf Region  Moderate depletion  Edema (RD Assessment)  None  Hair  Reviewed  Eyes  Reviewed  Mouth  Reviewed  Skin  Reviewed  Nails  Reviewed     Diet Order:   Diet Order            Diet heart healthy/carb modified Room service appropriate? Yes; Fluid consistency: Thin  Diet effective now             EDUCATION NEEDS:   Education needs have been addressed  Skin:  Skin Assessment: Reviewed RN Assessment(ecchymosis)  Last BM:  07/30/2018 - medium type 6  Height:   Ht Readings from Last 1 Encounters:  07/30/18 5' 7"  (1.702 m)   Weight:   Wt Readings from Last 1 Encounters:  08/01/18 74.1 kg   Ideal Body Weight:  61.4 kg  BMI:  Body mass index is 25.59 kg/m.  Estimated Nutritional Needs:   Kcal:  1500-1700  Protein:  75-85 grams  Fluid:  1.5-1.7 L/day  Willey Blade, MS, RD, LDN Office: [phone] Pager: [phone] After Hours/Weekend Pager: [phone]

## 2018-08-01 NOTE — Discharge Summary (Signed)
Pamela Collier at [place] [name]    MR#:  [id number]  DATE OF BIRTH:  10/07/1940  DATE OF ADMISSION:  07/30/2018 ADMITTING PHYSICIAN: Pamela Basta, MD  DATE OF DISCHARGE:  08/01/18   PRIMARY CARE PHYSICIAN: Pamela Mc, MD    ADMISSION DIAGNOSIS:  Orthopnea [R06.01] Back pain [M54.9] CHF (congestive heart failure) (HCC) [I50.9] Pleural effusion, right [J90] Acute on chronic congestive heart failure, unspecified heart failure type ([place]) [I50.9]  DISCHARGE DIAGNOSIS:  Active Problems:   Pleural effusion due to CHF (congestive heart failure) (HCC)   Pleural effusion   Malnutrition of moderate degree Status post thoracocentesis  SECONDARY DIAGNOSIS:   Past Medical History:  Diagnosis Date  . Acute respiratory failure ([place]) 11/21/2014  . Anxiety   . Arthritis 08/05/2013  . Atherosclerotic peripheral vascular disease with gangrene [facility]) august 2012  . Carotid arterial disease ([place])    a. 12/7865 RICA 67-20, LICA 94-70.  [name] Cerebral infarct ([place]) 08/17/2013  . Chronic diastolic CHF (congestive heart failure) (HCC)    a. EF 50-55% by echo in 04/2015; b. 11/2016 Echo: EF 55-60%, Gr1 DD; c. 05/2018 Echo: EF 60-65%, no rwma.  [name] COPD (chronic obstructive pulmonary disease) ([place])   . Coronary artery disease, occlusive    a. Previous PCI to the LAD, LCx, and RCA in 2010, 2011, 2013, and 2016. All performed in [place]; b. 11/2016 Lexiscan MV: EF 43%, no ischemia->low risk.  . 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 Diabetic diarrhea ([place]) 10/03/2014  . DM type 2, uncontrolled, with renal complications ([place]) 01/16/2835  . Hepatic steatosis    by CT abd pelvis  . History of Ischemic Cardiomyopathy    a. EF 35 to 40% by echo in 2013 but subsequently normalized.  [name] History of kidney stones    at a younger age  . Hyperlipidemia LDL goal <100 02/23/2014  . Hypertensive heart disease   . Osteoporosis, post-menopausal   . 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 Pill esophagitis   . Pleural effusion 10/25/2012   10/2012 CT chest >> small to moderate R lung effusion>> chylothorax, 100% lymphs 10/2013 thoracentesis> cytology negative, WBC 1471, > 90% "small lymphs" 01/2014 CT chest> near complete resolution of pleural effusion, stable lymphadenopathy 08/2014 CT chest [place] ([facility]): small right sided effusion decreased in size, stable mediastinal lymphadenopathy 1.0cm largest, 81m . 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 Recurrent pleural effusion on right    a. 05/2018 s/p thoracentesis -->7576m  . Sleep apnea   . [facility]    HOSPITAL COURSE:  HISTORY OF PRESENT ILLNESS: Pamela Wiseneris a [age].o. female with a known history of carotid artery disease, cerebral infarct, chronic diastolic CHF ejection fraction 55%, COPD, coronary artery disease, depression, diabetes, hyperlipidemia, pleural effusion-was feeling short of breath for last few days without cough chills.  Had some chest pain no palpitation.  She denies any leg edema. She had pleural effusion in the past and required thoracentesis few months ago as per her. Noted to have significant pleural effusion  on the right side on CT scan likely due to CHF and advised to admit to medical services.  Hospital course  *Bilateral pleural effusion with large on the right, due to CHF. Pamela Collier clinically is feeling better Repeat chest x-ray with a small to moderate size right-sided pleural effusion IR guided thoracentesis performed soon after admission CRP less than 0.8 Clinically improved with IV Lasix will  [name] with p.o. Lasix on daily basis  *Acute on chronic diastolic congestive heart failure Clinically improved with IV Lasix recent echocardiogram 06/06/2018 has revealed 60 to 65% ejection fraction  Cardiology consult with [facility], Dr. Rockey Collier has seen the Pamela Collier during the hospital course  -Pamela Collier is to take Lasix once daily Outpatient follow-up CHF clinic Daily weight monitoring, intake and output Diet education provided.  Daily fluid intake 1600- 1800 mL/day   *Moderate malnutrition  Seen and evaluated by dietitian education provided Glucerna twice a day  *Coronary artery disease status post stent in the past Continue aspirin, Brilinta, statin, beta-blocker.  *Essential hypertension Continue home medications.  *Hypokalemia Potassium at 4.8 continue close monitoring while Pamela Collier is on IV Lasix  *Diabetes mellitus type 2 Continue home dose of insulin and keep on sliding scale coverage.  *Stage III chronic kidney disease Follow renal function with diuretics. Creatinine 1.27-1.17  [name] and resume home health with PT RN and aide DISCHARGE CONDITIONS:   Stable  CONSULTS OBTAINED:  Treatment Team:  Pamela Basta, MD End, Pamela Gave, MD   PROCEDURES thoracocentesis  DRUG ALLERGIES:   Allergies  Allergen Reactions  . Cephalexin Itching  . Contrast Media [Iodinated Diagnostic Agents] Itching  . Doxycycline Itching  . Gabapentin Itching  . Sulfa Antibiotics Itching    DISCHARGE MEDICATIONS:   Allergies as of 08/01/2018      Reactions   Cephalexin Itching   Contrast Media [iodinated Diagnostic Agents] Itching   Doxycycline Itching   Gabapentin Itching   Sulfa Antibiotics Itching      Medication List    STOP taking these medications   guaiFENesin-dextromethorphan 100-10 MG/5ML syrup Commonly known as:  ROBITUSSIN DM   mometasone-formoterol 200-5 MCG/ACT Aero Commonly known as:  Dulera     TAKE these  medications   acetaminophen 500 MG tablet Commonly known as:  TYLENOL Take 1,000 mg by mouth every 6 (six) hours as needed for mild pain or headache.   allopurinol 100 MG tablet Commonly known as:  ZYLOPRIM TAKE 1 TABLET BY MOUTH DAILY   ALPRAZolam 0.25 MG tablet Commonly known as:  XANAX Take 1 tablet (0.25 mg total) by mouth 2 (two) times daily as needed for anxiety.   amLODipine 10 MG tablet Commonly known as:  NORVASC TAKE 1 TABLET BY MOUTH DAILY What changed:  Another medication with the same name was removed. Continue taking this medication, and follow the directions you see here.   aspirin EC 81 MG tablet Take 81 mg by mouth daily.   B-D UF III MINI PEN NEEDLES 31G X 5 MM Misc Generic drug:  Insulin Pen Needle USE THREE TIMES A DAY   blood glucose meter kit and supplies Dispense based on Pamela Collier and insurance preference. Use up to three times daily as directed. (FOR ICD-10 E11.21)   dicyclomine 10 MG capsule Commonly known as:  BENTYL Take 1 capsule (10 mg total) by mouth 4 (four) times daily -  before meals and at bedtime.   diphenoxylate-atropine 2.5-0.025 MG tablet Commonly known as:  LOMOTIL Take 1 tablet by mouth 4 (four)  times daily as needed for diarrhea or loose stools.   donepezil 10 MG tablet Commonly known as:  ARICEPT Take 10 mg by mouth at bedtime.   feeding supplement (GLUCERNA SHAKE) Liqd Take 237 mLs by mouth 2 (two) times daily between meals.   fluticasone 50 MCG/ACT nasal spray Commonly known as:  FLONASE Place 2 sprays into both nostrils daily as needed for rhinitis.   furosemide 20 MG tablet Commonly known as:  LASIX Take 1 tablet (20 mg total) by mouth daily. What changed:  You were already taking a medication with the same name, and this prescription was added. Make sure you understand how and when to take each.   furosemide 20 MG tablet Commonly known as:  LASIX Take 1 tablet (20 mg total) by mouth as needed for fluid or edema (For  weight gain of 2 pounds per day). Take 1 tablet (20 mg ) once daily on Monday, Wednesday and Friday. What changed:    when to take this  reasons to take this   glucose blood test strip Use to check blood sugars 2 times daily   glucose blood test strip Commonly known as:  Accu-Chek Aviva Plus USE TO CHECK GLUCOSE THREE TIMES DAILY   guaiFENesin 600 MG 12 hr tablet Commonly known as:  MUCINEX Take 600 mg by mouth 2 (two) times daily.   HumaLOG Mix 75/25 KwikPen (75-25) 100 UNIT/ML Kwikpen Generic drug:  Insulin Lispro Prot & Lispro 20 units with breakfast and 30 units with evening meal What changed:    how much to take  how to take this  when to take this  additional instructions   hydrALAZINE 25 MG tablet Commonly known as:  APRESOLINE Take 1 tablet (25 mg total) by mouth 3 (three) times daily.   ipratropium-albuterol 0.5-2.5 (3) MG/3ML Soln Commonly known as:  DUONEB Take 3 mLs by nebulization every 6 (six) hours as needed (wheezing, shortness of breath).   isosorbide mononitrate 30 MG 24 hr tablet Commonly known as:  IMDUR Take 1 tablet (30 mg total) by mouth daily.   nitroGLYCERIN 0.4 MG/SPRAY spray Commonly known as:  NITROLINGUAL Place 1 spray under the tongue every 5 (five) minutes x 3 doses as needed for chest pain.   ondansetron 4 MG tablet Commonly known as:  ZOFRAN Take 4 mg by mouth every 8 (eight) hours as needed for nausea or vomiting.   Potassium Chloride ER 20 MEQ Tbcr Take 10 mEq by mouth daily.   predniSONE 10 MG tablet Commonly known as:  DELTASONE Take 10 mg by mouth daily with breakfast.   pregabalin 100 MG capsule Commonly known as:  LYRICA Take 100 mg by mouth 2 (two) times daily.   propranolol 10 MG tablet Commonly known as:  INDERAL Take 1 tablet (10 mg total) by mouth 3 (three) times daily.   rosuvastatin 20 MG tablet Commonly known as:  CRESTOR Take 1 tablet (20 mg total) by mouth daily.   senna 8.6 MG tablet Commonly  known as:  SENOKOT Take 1 tablet by mouth 2 (two) times daily as needed for constipation.   ticagrelor 60 MG Tabs tablet Commonly known as:  Brilinta Take 1 tablet (60 mg total) by mouth 2 (two) times daily.   traMADol 50 MG tablet Commonly known as:  Ultram Take 1 tablet (50 mg total) by mouth 2 (two) times daily as needed.        DISCHARGE INSTRUCTIONS:   Follow-up with primary care physician in 3 to  4 days Outpatient follow-up with CHF clinic in 2 to 3 days Daily weights and, intake and output Outpatient follow-up with cardiology Dr. Rockey Collier in a week  DIET:  Cardiac diet and Diabetic diet  DISCHARGE CONDITION:  Fair  ACTIVITY:  Activity as tolerated  OXYGEN:  Home Oxygen: No.   Oxygen Delivery: room air  DISCHARGE LOCATION:  home   If you experience worsening of your admission symptoms, develop shortness of breath, life threatening emergency, suicidal or homicidal thoughts you must seek medical attention immediately by calling 911 or calling your MD immediately  if symptoms less severe.  You Must read complete instructions/literature along with all the possible adverse reactions/side effects for all the Medicines you take and that have been prescribed to you. Take any new Medicines after you have completely understood and accpet all the possible adverse reactions/side effects.   Please note  You were cared for by a hospitalist during your hospital stay. If you have any questions about your discharge medications or the care you received while you were in the hospital after you are discharged, you can call the unit and asked to speak with the hospitalist on call if the hospitalist that took care of you is not available. Once you are discharged, your primary care physician will handle any further medical issues. Please note that NO REFILLS for any discharge medications will be authorized once you are discharged, as it is imperative that you return to your primary care  physician (or establish a relationship with a primary care physician if you do not have one) for your aftercare needs so that they can reassess your need for medications and monitor your lab values.     Today  Chief Complaint  Pamela Collier presents with  . Chest Pain  . Back Pain   Pamela Collier is feeling much better.  Shortness of breath significantly improved and wants to go home.  Denies any chest pain.  Okay to discharge Pamela Collier from cardiology standpoint  ROS:  CONSTITUTIONAL: Denies fevers, chills. Denies any fatigue, weakness.  EYES: Denies blurry vision, double vision, eye pain. EARS, NOSE, THROAT: Denies tinnitus, ear pain, hearing loss. RESPIRATORY: Denies cough, wheeze, shortness of breath.  CARDIOVASCULAR: Denies chest pain, palpitations, edema.  GASTROINTESTINAL: Denies nausea, vomiting, diarrhea, abdominal pain. Denies bright red blood per rectum. GENITOURINARY: Denies dysuria, hematuria. ENDOCRINE: Denies nocturia or thyroid problems. HEMATOLOGIC AND LYMPHATIC: Denies easy bruising or bleeding. SKIN: Denies rash or lesion. MUSCULOSKELETAL: Denies pain in neck, back, shoulder, knees, hips or arthritic symptoms.  NEUROLOGIC: Denies paralysis, paresthesias.  PSYCHIATRIC: Denies anxiety or depressive symptoms.   VITAL SIGNS:  Blood pressure (!) 144/56, pulse 62, temperature 98.1 F (36.7 C), temperature source Oral, resp. rate 18, height 5' 7" (1.702 m), weight 74.1 kg, SpO2 100 %.  I/O:    Intake/Output Summary (Last 24 hours) at 08/01/2018 1151 Last data filed at 08/01/2018 1030 Gross per 24 hour  Intake -  Output 2400 ml  Net -2400 ml    PHYSICAL EXAMINATION:  GENERAL:  78 y.o.-year-old Pamela Collier lying in the bed with no acute distress.  EYES: Pupils equal, round, reactive to light and accommodation. No scleral icterus. Extraocular muscles intact.  HEENT: Head atraumatic, normocephalic. Oropharynx and nasopharynx clear.  NECK:  Supple, no jugular venous distention. No  thyroid enlargement, no tenderness.  LUNGS: Normal breath sounds bilaterally, no wheezing, rales,rhonchi or crepitation. No use of accessory muscles of respiration.  CARDIOVASCULAR: S1, S2 normal. No murmurs, rubs, or gallops.  ABDOMEN: Soft, non-tender,  non-distended. Bowel sounds present. No organomegaly or mass.  EXTREMITIES: No pedal edema, cyanosis, or clubbing.  NEUROLOGIC: Awake, alert and oriented x3 gait not checked.  PSYCHIATRIC: The Pamela Collier is alert and oriented x 3.  SKIN: No obvious rash, lesion, or ulcer.   DATA REVIEW:   CBC Recent Labs  Lab 07/31/18 0458  WBC 8.1  HGB 10.8*  HCT 34.8*  PLT 314    Chemistries  Recent Labs  Lab 07/30/18 0458 07/31/18 0458  NA 143 140  K 3.1* 4.2  CL 108 107  CO2 28 26  GLUCOSE 67* 226*  BUN 21 20  CREATININE 1.27* 1.17*  CALCIUM 8.9 8.8*  AST 16  --   ALT 12  --   ALKPHOS 113  --   BILITOT 0.5  --     Cardiac Enzymes Recent Labs  Lab 07/30/18 0458  TROPONINI 0.05*    Microbiology Results  Results for orders placed or performed during the hospital encounter of 07/30/18  Body fluid culture     Status: None (Preliminary result)   Collection Time: 07/30/18 11:50 AM  Result Value Ref Range Status   Specimen Description   Final    PLEURAL Performed at [facility], [address] [place], [place] [address]    Special Requests   Final    NONE Performed at [facility], [address] [place], [place] [address]    Gram Stain   Final    RARE WBC PRESENT, PREDOMINANTLY MONONUCLEAR NO ORGANISMS SEEN    Culture   Final    NO GROWTH 2 DAYS Performed at [facility], [address], [place], [place] [address]    Report Status PENDING  Incomplete   *Note: Due to a large number of results and/or encounters for the requested time period, some results have not been displayed. A complete set of results can be found in Results Review.    RADIOLOGY:  Dg Chest 2 View  Result Date:  07/31/2018 CLINICAL DATA:  Recurrent right pleural effusion. Pulmonary sarcoidosis. Respiratory failure. EXAM: CHEST - 2 VIEW COMPARISON:  07/30/2018 FINDINGS: Lungs are somewhat hypoinflated demonstrate persistent right base opacification without significant change likely small to moderate size effusion with associated basilar atelectasis. Mild stable cardiomegaly. Remainder of the exam is unchanged. IMPRESSION: Stable small to moderate size right pleural effusion likely with associated basilar atelectasis. Mild stable cardiomegaly. Electronically Signed   By: Marin Olp M.D.   On: 07/31/2018 09:26   Dg Chest 2 View  Result Date: 07/30/2018 CLINICAL DATA:  Chest pain EXAM: CHEST - 2 VIEW COMPARISON:  06/07/2018 FINDINGS: Normal heart size and stable mediastinal contours. Dense right lower lobe airspace disease. Right middle lobe airspace disease with upward bowing of the minor fissure. Generalized interstitial coarsening. There may be pleural fluid on the right. No pneumothorax. IMPRESSION: Right middle and lower lobe consolidation. Electronically Signed   By: Monte Fantasia M.D.   On: 07/30/2018 05:18   Ct Chest Wo Contrast  Result Date: 07/30/2018 CLINICAL DATA:  Initial evaluation for back pain. History of COPD, cardiac stent. EXAM: CT CHEST WITHOUT CONTRAST CT THORACIC SPINE WITHOUT CONTRAST TECHNIQUE: Multidetector CT imaging of the chest was performed following the standard protocol without IV contrast. COMPARISON:  Comparison made with prior radiograph from earlier the same day as well as prior chest CT from 06/05/2018. FINDINGS: Cardiovascular: Intrathoracic aorta normal in caliber without aneurysm or visible acute finding on this noncontrast examination. Moderate atherosclerosis. Visualized great vessels  normal in caliber without obvious abnormality. Cardiomegaly with 3 vessel coronary artery calcifications. No pericardial effusion. Main pulmonary artery dilated up to 3.1 cm, suggesting chronic  underlying pulmonary hypertension, similar to previous. Mediastinum/Nodes: Thyroid normal. Multiple predominantly subcentimeter calcified lymph nodes seen throughout the mediastinum and bilateral hila, compatible with prior granulomatous infection. No enlarged axillary nodes. Overall, appearance is stable from previous. No new or concerning adenopathy. Trace layering secretions noted within the esophageal lumen. Esophagus otherwise unremarkable. Lungs/Pleura: Tracheobronchial tree intact and patent. Large layering right pleural effusion with associated sub pulmonic component. Small layering left effusion present as well. Associated consolidative opacities present within the right middle and lower lobes. Mild hazy opacities within the posterior left upper and lower lobes favored to reflect atelectasis and/or mild congestion. No pneumothorax. 5 mm right lower lobe nodule stable from previous, likely related to prior granulomatous disease. No other new worrisome pulmonary nodule or mass. Upper Abdomen: Visualized upper abdomen demonstrates no acute finding. Adrenal glands are somewhat diffusely enlarged bilaterally, which could reflect underlying adrenal hyperplasia, stable from previous. Scattered prominent gastrohepatic, porta hepatis, and aortocaval lymph nodes noted, again likely related to prior granulomatous infection. Few punctate calcified granulomas noted within the spleen. Musculoskeletal: External soft tissues demonstrate no acute finding. No acute osseous abnormality. No discrete lytic or blastic osseous lesions. Remotely healed fracture of the left posterolateral sixth rib noted. Visualized external soft tissues demonstrate no acute finding. CT THORACIC SPINE Alignment: Vertebral bodies normally aligned with preservation of the normal thoracic kyphosis. No listhesis or malalignment. Vertebrae: Cervical ACDF partially visualized. Vertebral body heights well maintained without evidence for acute or chronic  fracture. No discrete lytic or blastic osseous lesions. Visualized ribs intact and normal. Paraspinal and other soft tissues: Paraspinous soft tissues demonstrate no acute finding. Disc levels: Multilevel degenerative endplate spurring seen throughout the thoracic spine, most notable within the mid and lower aspect. Mild scattered posterior element hypertrophy, most notable at T10-11. No significant disc pathology. No significant spinal stenosis or significant bony foraminal narrowing. IMPRESSION: CT CHEST IMPRESSION: 1. Large right with small left layering pleural effusions. Associated consolidative opacity involving the right middle and lower lobes felt to be most consistent with compressive atelectasis, although superimposed infiltrate could be considered in the correct clinical setting. 2. Sequelae of chronic granulomatous disease with multiple calcified mediastinal and hilar adenopathy, with calcified granulomata of the spleen. 3. 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 4. Cardiomegaly with diffuse 3 vessel coronary artery calcifications with moderate aortic atherosclerosis. 5. Cardiomegaly with 3 vessel coronary artery calcifications. 6. Mild thickening of the adrenal glands bilaterally, which could reflect an underlying degree of adrenal hyperplasia. CT THORACIC SPINE IMPRESSION: 1. No fracture or other acute abnormality within the thoracic spine. 2. Moderate chronic endplate degenerative spurring throughout the thoracic spine. No appreciable significant disc pathology or stenosis. 3. Mild multilevel facet hypertrophy, most notable at T10-11, which could contribute to underlying back pain. Electronically Signed   By: Jeannine Boga M.D.   On: 07/30/2018 06:54   Ct T-spine No Charge  Result Date: 07/30/2018 CLINICAL DATA:  Initial evaluation for back pain. History of COPD, cardiac stent.  EXAM: CT CHEST WITHOUT CONTRAST CT THORACIC SPINE WITHOUT CONTRAST TECHNIQUE: Multidetector CT imaging of the chest was performed following the standard protocol without IV contrast. COMPARISON:  Comparison made with prior radiograph from earlier the same day as well as prior chest CT from  06/05/2018. FINDINGS: Cardiovascular: Intrathoracic aorta normal in caliber without aneurysm or visible acute finding on this noncontrast examination. Moderate atherosclerosis. Visualized great vessels normal in caliber without obvious abnormality. Cardiomegaly with 3 vessel coronary artery calcifications. No pericardial effusion. Main pulmonary artery dilated up to 3.1 cm, suggesting chronic underlying pulmonary hypertension, similar to previous. Mediastinum/Nodes: Thyroid normal. Multiple predominantly subcentimeter calcified lymph nodes seen throughout the mediastinum and bilateral hila, compatible with prior granulomatous infection. No enlarged axillary nodes. Overall, appearance is stable from previous. No new or concerning adenopathy. Trace layering secretions noted within the esophageal lumen. Esophagus otherwise unremarkable. Lungs/Pleura: Tracheobronchial tree intact and patent. Large layering right pleural effusion with associated sub pulmonic component. Small layering left effusion present as well. Associated consolidative opacities present within the right middle and lower lobes. Mild hazy opacities within the posterior left upper and lower lobes favored to reflect atelectasis and/or mild congestion. No pneumothorax. 5 mm right lower lobe nodule stable from previous, likely related to prior granulomatous disease. No other new worrisome pulmonary nodule or mass. Upper Abdomen: Visualized upper abdomen demonstrates no acute finding. Adrenal glands are somewhat diffusely enlarged bilaterally, which could reflect underlying adrenal hyperplasia, stable from previous. Scattered prominent gastrohepatic, porta hepatis, and  aortocaval lymph nodes noted, again likely related to prior granulomatous infection. Few punctate calcified granulomas noted within the spleen. Musculoskeletal: External soft tissues demonstrate no acute finding. No acute osseous abnormality. No discrete lytic or blastic osseous lesions. Remotely healed fracture of the left posterolateral sixth rib noted. Visualized external soft tissues demonstrate no acute finding. CT THORACIC SPINE Alignment: Vertebral bodies normally aligned with preservation of the normal thoracic kyphosis. No listhesis or malalignment. Vertebrae: Cervical ACDF partially visualized. Vertebral body heights well maintained without evidence for acute or chronic fracture. No discrete lytic or blastic osseous lesions. Visualized ribs intact and normal. Paraspinal and other soft tissues: Paraspinous soft tissues demonstrate no acute finding. Disc levels: Multilevel degenerative endplate spurring seen throughout the thoracic spine, most notable within the mid and lower aspect. Mild scattered posterior element hypertrophy, most notable at T10-11. No significant disc pathology. No significant spinal stenosis or significant bony foraminal narrowing. IMPRESSION: CT CHEST IMPRESSION: 1. Large right with small left layering pleural effusions. Associated consolidative opacity involving the right middle and lower lobes felt to be most consistent with compressive atelectasis, although superimposed infiltrate could be considered in the correct clinical setting. 2. Sequelae of chronic granulomatous disease with multiple calcified mediastinal and hilar adenopathy, with calcified granulomata of the spleen. 3. 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 4. Cardiomegaly with diffuse 3 vessel coronary artery calcifications with moderate aortic atherosclerosis. 5. Cardiomegaly with 3 vessel coronary artery  calcifications. 6. Mild thickening of the adrenal glands bilaterally, which could reflect an underlying degree of adrenal hyperplasia. CT THORACIC SPINE IMPRESSION: 1. No fracture or other acute abnormality within the thoracic spine. 2. Moderate chronic endplate degenerative spurring throughout the thoracic spine. No appreciable significant disc pathology or stenosis. 3. Mild multilevel facet hypertrophy, most notable at T10-11, which could contribute to underlying back pain. Electronically Signed   By: Jeannine Boga M.D.   On: 07/30/2018 06:54   Dg Chest Port 1 View  Result Date: 07/30/2018 CLINICAL DATA:  Follow-up thoracentesis EXAM: PORTABLE CHEST 1 VIEW COMPARISON:  Earlier same day FINDINGS: There is less pleural fluid on the right. No pneumothorax. Some pleural fluid does persist with basilar volume loss.  Left chest remains clear. Pulmonary venous hypertension again noted IMPRESSION: Less pleural fluid on the right following thoracentesis, with less right lung atelectasis. No pneumothorax. Electronically Signed   By: Nelson Chimes M.D.   On: 07/30/2018 12:37   US Thoracentesis Asp Pleural Space W/img Guide  Result Date: 07/30/2018 INDICATION: Right effusion EXAM: ULTRASOUND GUIDED RIGHT THORACENTESIS MEDICATIONS: None. COMPLICATIONS: None immediate. PROCEDURE: An ultrasound guided thoracentesis was thoroughly discussed with the Pamela Collier and questions answered. The benefits, risks, alternatives and complications were also discussed. The Pamela Collier understands and wishes to proceed with the procedure. Written consent was obtained. Ultrasound was performed to localize and mark an adequate pocket of fluid in the right chest. The area was then prepped and draped in the normal sterile fashion. 1% Lidocaine was used for local anesthesia. Under ultrasound guidance a Safe-T-Centesis catheter was introduced. Thoracentesis was performed. The catheter was removed and a dressing applied. FINDINGS: A total of  approximately 720 mL of clear yellow fluid was removed. Samples were sent to the laboratory as requested by the clinical team. IMPRESSION: Successful ultrasound guided right thoracentesis yielding 720 mL of pleural fluid. No pneumothorax on follow-up radiograph. Electronically Signed   By: Lucrezia Europe M.D.   On: 07/30/2018 12:49    EKG:   Orders placed or performed during the hospital encounter of 07/30/18  . ED EKG  . ED EKG  . EKG 12-Lead  . EKG 12-Lead  . EKG 12-Lead  . EKG 12-Lead   *Note: Due to a large number of results and/or encounters for the requested time period, some results have not been displayed. A complete set of results can be found in Results Review.      Management plans discussed with the Pamela Collier, she is  in agreement.  CODE STATUS:     Code Status Orders  (From admission, onward)         Start     Ordered   07/30/18 1017  Full code  Continuous     07/30/18 1016        Code Status History    Date Active Date Inactive Code Status Order ID Comments User Context   06/05/2018 2309 06/08/2018 2211 Full Code [id number]  Lance Coon, MD Inpatient   04/25/2018 0647 04/30/2018 2057 Full Code [id number]  Harrie Foreman, MD Inpatient   06/04/2017 0151 06/04/2017 1944 Full Code [id number]  Lance Coon, MD Inpatient   05/24/2017 0129 05/26/2017 1926 Full Code [id number]  Lance Coon, MD Inpatient   11/14/2016 2249 11/15/2016 [place] Full Code [id number]  [place], [place], DO ED   10/10/2016 1325 10/13/2016 2020 Full Code [id number]  Earnie Larsson, MD Inpatient   06/11/2016 2142 06/12/2016 1923 Full Code [id number]  [place], [place], DO ED   01/02/2016 0524 01/02/2016 0914 Full Code [id number]  Saundra Shelling, MD Inpatient   06/26/2015 0736 06/29/2015 1954 Full Code [id number]  Demetrios Loll, MD ED   04/20/2015 0118 04/22/2015 1918 Full Code [id number]  Lance Coon, MD Inpatient      TOTAL TIME TAKING CARE OF THIS Pamela Collier: 43 minutes.   Note: This dictation was prepared with Dragon  dictation along with smaller phrase technology. Any transcriptional errors that result from this process are unintentional.   _0 @  on 08/01/2018 at 11:51 AM  Between 7am to 6pm - Pager - [phone]  After 6pm go to [email or web address] - password [email or web address] [email or web address]  Office  [phone]  CC: Primary care physician; Pamela Mc, MD

## 2018-08-01 NOTE — TOC Transition Note (Signed)
Transition of Care [facility]) - CM/SW Discharge Note   Patient Details  [name] MRN: [id number] Date of Birth: Feb 25, 1941  Transition of Care [facility]) CM/SW Contact:  Latanya Maudlin, RN Phone Number: 08/01/2018, 11:02 AM   Clinical Narrative:   Patient to be discharged per MD order. Orders in place for home health services. Patient active with [facility]. Resumption of care orders in place. Notified Kendra with [facility] of pending discharge. No DME needs. Family to transport.     Final next level of care: [place] Barriers to Discharge: No Barriers Identified   Patient Goals and CMS Choice Patient states their goals for this hospitalization and ongoing recovery are:: DC to home with husaband with [facility] CMS Medicare.gov Compare Post Acute Care list provided to:: Patient Choice offered to / list presented to : Patient  Discharge Placement                       Discharge Plan and Services   Discharge Planning Services: CM Consult, HF Clinic Post Acute Care Choice: Home Health              HH Arranged: RN, PT, OT, Nurse's Aide [place] Agency: [facility]   Social Determinants of Health (SDOH) Interventions     Readmission Risk Interventions Readmission Risk Prevention Plan 07/30/2018  Transportation Screening Complete  Medication Review Press photographer) Complete  PCP or Specialist appointment within 3-5 days of discharge Complete  HRI or Home Care Consult Complete  SW Recovery Care/Counseling Consult Complete  [place] Complete  Some recent data might be hidden

## 2018-08-01 NOTE — Discharge Instructions (Signed)
Follow-up with primary care physician in 3 to 4 days Outpatient follow-up with CHF clinic in 2 to 3 days Daily weights and, intake and output Outpatient follow-up with cardiology Dr. Rockey Situ in a week

## 2018-08-02 ENCOUNTER — Telehealth: Payer: Self-pay | Admitting: Internal Medicine

## 2018-08-02 DIAGNOSIS — I5032 Chronic diastolic (congestive) heart failure: Secondary | ICD-10-CM | POA: Diagnosis not present

## 2018-08-02 DIAGNOSIS — N184 Chronic kidney disease, stage 4 (severe): Secondary | ICD-10-CM | POA: Diagnosis not present

## 2018-08-02 DIAGNOSIS — J9621 Acute and chronic respiratory failure with hypoxia: Secondary | ICD-10-CM | POA: Diagnosis not present

## 2018-08-02 DIAGNOSIS — E1122 Type 2 diabetes mellitus with diabetic chronic kidney disease: Secondary | ICD-10-CM | POA: Diagnosis not present

## 2018-08-02 DIAGNOSIS — I130 Hypertensive heart and chronic kidney disease with heart failure and stage 1 through stage 4 chronic kidney disease, or unspecified chronic kidney disease: Secondary | ICD-10-CM | POA: Diagnosis not present

## 2018-08-02 DIAGNOSIS — J449 Chronic obstructive pulmonary disease, unspecified: Secondary | ICD-10-CM | POA: Diagnosis not present

## 2018-08-02 LAB — BODY FLUID CULTURE: Culture: NO GROWTH

## 2018-08-02 NOTE — Telephone Encounter (Signed)
Transition Care Management Follow-up Telephone Call  How have you been since you were released from the hospital? Patient came home from hospital and she stated she is feeling better, reports no SOB. Patient was DC on furosemide 20 daily  On script one second script say Furosemide 20 mg by mouth as needed for weight gain of 2 pounds, Take 1 tablet on Monday , Wednesday, and Friday. No scripts were sent home with patient ok to fill and which SIG.   Do you understand why you were in the hospital? yes   Do you understand the discharge instrcutions? yes  Items Reviewed:  Medications reviewed: yes  Allergies reviewed: yes  Dietary changes reviewed: yes  Referrals reviewed: yes   Functional Questionnaire:   Activities of Daily Living (ADLs):   She states they are independent in the following: feeding, continence, grooming and toileting States they require assistance with the following: ambulation, bathing and hygiene and dressing   Any transportation issues/concerns?: no   Any patient concerns? no   Confirmed importance and date/time of follow-up visits scheduled: yes   Confirmed with patient if condition begins to worsen call PCP or go to the ER.  Patient was given the Call-a-Nurse line [phone]: yes

## 2018-08-02 NOTE — Telephone Encounter (Signed)
So confusing for patient   She is to take it daily,  And take an Pamela Collier Friday .  If she gains 2 lbs  in spite of this schedule,  She takes an extra dose

## 2018-08-03 ENCOUNTER — Telehealth: Payer: Self-pay | Admitting: Cardiovascular Disease

## 2018-08-03 ENCOUNTER — Other Ambulatory Visit: Payer: Self-pay | Admitting: *Deleted

## 2018-08-03 DIAGNOSIS — J449 Chronic obstructive pulmonary disease, unspecified: Secondary | ICD-10-CM | POA: Diagnosis not present

## 2018-08-03 DIAGNOSIS — N184 Chronic kidney disease, stage 4 (severe): Secondary | ICD-10-CM | POA: Diagnosis not present

## 2018-08-03 DIAGNOSIS — I130 Hypertensive heart and chronic kidney disease with heart failure and stage 1 through stage 4 chronic kidney disease, or unspecified chronic kidney disease: Secondary | ICD-10-CM | POA: Diagnosis not present

## 2018-08-03 DIAGNOSIS — J9621 Acute and chronic respiratory failure with hypoxia: Secondary | ICD-10-CM | POA: Diagnosis not present

## 2018-08-03 DIAGNOSIS — E1122 Type 2 diabetes mellitus with diabetic chronic kidney disease: Secondary | ICD-10-CM | POA: Diagnosis not present

## 2018-08-03 DIAGNOSIS — I5032 Chronic diastolic (congestive) heart failure: Secondary | ICD-10-CM | POA: Diagnosis not present

## 2018-08-03 MED ORDER — FUROSEMIDE 20 MG PO TABS: 20.0000 mg | ORAL_TABLET | ORAL | 2 refills | Status: DC | PRN

## 2018-08-03 NOTE — Telephone Encounter (Signed)
Resent rx to pharmacy.

## 2018-08-03 NOTE — Telephone Encounter (Signed)
Pt. Reports her drug store [place] did not receive her Lasix prescription sent by the hospitalists. Could Dr. Derrel Nip please resend.

## 2018-08-03 NOTE — Telephone Encounter (Signed)
Please call patient today since Juliann Pulse is out of office.

## 2018-08-03 NOTE — Patient Outreach (Signed)
[facility]) Care Management  08/03/2018  Pamela Collier 10-15-1940 [id number]  Telephone outreach call  Transition of care by PCP office   Patient admission to [facility] 11/8-11/11 Dx: Near Syncope , fall  Readmission at [facility] 12/15-12/20, Dx: Acute on Chronic Diastolic heart failure .[facility] 12/20-1/13.  Patient with [facility] readmission 1/25-1/28, Dx : Acute pulmonary edema, pleural effusion Acute on chronic respiratory failure with hypoxia. Discharge to [facility] for rehab. 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   Successful outreach call to patient, she discussed recent hospital admission, explained that she started feeling pitter patter in her chest, she discussed having fluid removed from her lung during stay. Heart Failure  Patient discussed feeling better on day. She denies having shortness of breath, reports no swelling in legs, ankles,. Patient reports that her weights have been 162 for the last 2 days.  Discussed importance of limiting salt in her diet, patient remarks that she does not use salt, discussed hidden salt in foods especially fast foods.   Falls risk  Patient reports using walker, she continues to wear orthopedic shoe, reports that her foot feels better. Fall precautions reviewed .  Diabetes Patient reports blood sugar this morning was 209, she reports taking 20 units of 70/30 on insulin after breakfast .Patient discussed having Glucerna on hand at home drinking between meals . Discussed having balanced meals throughout the day.   Medication Patient dicussed not having prescription for lasix that she is now to take daily. Patient discussed calling MD office to notify them and her son is now gone to pharmacy to pick up. Patient is not able to review her medication list at this time.  Social  Patient discussed that her son is now in town to help she and  spouse  and will be here for a while. Discussed returning call later to review medication list   Patient states she anticipates home visit from [facility] on today.   Call to [facility] verifies home visit plan on today, discussed concern regarding patient having all discharge, she will review at visit, provided my contact number for further care coordination needs.   Appointments  Patient acknowledge PCP visit with Dr.Tullo on 3/25 Heart failure clinic visit on 3/26 Patient needs follow up appointment with Dr.Gollan  Discussed again  with patient community EMT Paramedicine for heart failure follow up in the home ,for education ,support, assessment , emphasized the benefit of service. Patient is in agreement with the program to help with preventing readmission .   Patient agreeable that I return call later today to speak with her son Pamela Collier.   1600 Returned call to patient to follow up/review medication list with patient son. Patient states that her son is not available at this time.  She then discussed that home health RN has visited this afternoon. Patient reports that she has received call from [facility] office, and her appointment will be on the telephone on Friday.     Plan  Will plan return call to the patient  in the next week .  Placed call to heart failure clinic for referral for paramedicine EMT for home visit for management/support  of heart failure condition. Will send in basket message to Dr. Derrel Nip.    [facility] CM Care Plan Problem One     Most Recent Value  Care Plan Problem One  High risk for readmission related to recent discharge  for HF,pleural effusion   Role Documenting the Problem One  Care Management [place] for Problem One  Active  THN Long Term Goal   Patient will not experience a hospital admission over the next 60 days   [facility] Long Term Goal Start Date  08/03/18 Sudie Grumbling restart ]  Interventions for Problem One Long Term Goal  Review of  discharge instructions, folllow up visits,, medications changes. Advised regarding importance of taking medications as prescrbed, notifying md of questions.. Encouraged to take her pill packaging to visit.    [facility] CM Short Term Goal #1   Over the next 30 days patient will be able to report weighing self daily and keeping a record  [facility] CM Short Term Goal #1 Start Date  08/03/18  Interventions for Short Term Goal #1  Advised regarding importance of daily weights and keeping a record to identify sudden weight changes  Advised regarding the best time of day to weight, in the morning after going to the bathroom and before getting dressed   [facility] CM Short Term Goal #2   Patient will be able to identify 2 worsening symptoms of Heart failure over the next 30 days   [facility] CM Short Term Goal #2 Start Date  08/03/18  Interventions for Short Term Goal #2  Again reviewed Heart failure worsening symptoms of increased weight of 3 pounds in a day and 5 in a week, increased swelling, shortness of breath to notify MD sooner. Will verify patient as received [facility]N heart failure packet infomation and resend if she is unable to locate   [facility] CM Short Term Goal #3  Over the next 30 days patient will be able to identify at least 3 high salt food to avoid over the next 30 days   THN CM Short Term Goal #3 Start Date  08/03/18  Interventions for Short Tern Goal #3  Discussed foods with hidden salt , proccessed foods, ham [place], bacon sausage, reforced how salt causes body to hold on to fluid , increasing weight, causing shortness of breath       [name], RN, [place] Management Coordinator  [phone]- Mobile [phone]- [place]

## 2018-08-03 NOTE — Telephone Encounter (Signed)
TELEPHONE CALL NOTE  Pamela Collier has been deemed a candidate for a follow-up tele-health visit to limit community exposure during the Covid-19 pandemic. I spoke with the patient via phone to ensure availability of phone/video source, confirm preferred email & phone number, discuss instructions and expectations, and review consent.   I reminded Pamela Collier to be prepared with any vital sign and/or heart rhythm information that could potentially be obtained via home monitoring, at the time of her visit.  Finally, I reminded Pamela Collier to expect an e-mail containing a link for their video-based visit approximately 15 minutes before her visit, or alternatively, a phone call at the time of her visit if her visit is planned to be a phone encounter.  Did the patient verbally consent to treatment as below? yes  Pamela Collier 08/03/2018 2:40 PM  DOWNLOADING THE SOFTWARE (If applicable)  Download the [employer] app to enable video and telephone visits with your [facility] Provider.   Instructions for downloading [employer] WebEx: - Go to https://www.webex.com/downloads.html and follow the instructions - If you have technical difficulties with downloading WebEx, please call WebEx at [phone]. - Once the app is downloaded (can be done on either mobile or desktop computer), go to Settings in the upper left hand corner.  Be sure that camera and audio are enabled.  - You will receive an email message with a link to the meeting with a time to join for your tele-health visit.  - Please download the app and have settings configured prior to the appointment time.    CONSENT FOR TELE-HEALTH VISIT - PLEASE REVIEW  I hereby voluntarily request, consent and authorize [place] and its employed or contracted physicians, physician assistants, nurse practitioners or other licensed health care professionals (the Practitioner), to provide me with telemedicine health care services (the  Services") as deemed necessary by the treating Practitioner. I acknowledge and consent to receive the Services by the Practitioner via telemedicine. I understand that the telemedicine visit will involve communicating with the Practitioner through live audiovisual communication technology and the disclosure of certain medical information by electronic transmission. I acknowledge that I have been given the opportunity to request an in-person assessment or other available alternative prior to the telemedicine visit and am voluntarily participating in the telemedicine visit.  I understand that I have the right to withhold or withdraw my consent to the use of telemedicine in the course of my care at any time, without affecting my right to future care or treatment, and that the Practitioner or I may terminate the telemedicine visit at any time. I understand that I have the right to inspect all information obtained and/or recorded in the course of the telemedicine visit and may receive copies of available information for a reasonable fee.  I understand that some of the potential risks of receiving the Services via telemedicine include:   Delay or interruption in medical evaluation due to technological equipment failure or disruption;  Information transmitted may not be sufficient (e.g. poor resolution of images) to allow for appropriate medical decision making by the Practitioner; and/or   In rare instances, security protocols could fail, causing a breach of personal health information.  Furthermore, I acknowledge that it is my responsibility to provide information about my medical history, conditions and care that is complete and accurate to the best of my ability. I acknowledge that Practitioner's advice, recommendations, and/or decision may be based on factors not within their control, such as incomplete  or inaccurate data provided by me or distortions of diagnostic images or specimens that may result from  electronic transmissions. I understand that the practice of medicine is not an exact science and that Practitioner makes no warranties or guarantees regarding treatment outcomes. I acknowledge that I will receive a copy of this consent concurrently upon execution via email to the email address I last provided but may also request a printed copy by calling the office of [place].    I understand that my insurance will be billed for this visit.   I have read or had this consent read to me.  I understand the contents of this consent, which adequately explains the benefits and risks of the Services being provided via telemedicine.   I have been provided ample opportunity to ask questions regarding this consent and the Services and have had my questions answered to my satisfaction.  I give my informed consent for the services to be provided through the use of telemedicine in my medical care  By participating in this telemedicine visit I agree to the above.

## 2018-08-03 NOTE — Addendum Note (Signed)
Addended by: Adair Laundry on: 08/03/2018 09:42 AM   Modules accepted: Orders

## 2018-08-03 NOTE — Telephone Encounter (Signed)
-----   Message from Minna Merritts, MD sent at 08/01/2018  1:07 PM EDT ----- Regarding: evisit 3-27 or after Post hospital diastolic CHF D/c 8/75

## 2018-08-03 NOTE — Telephone Encounter (Signed)
LMTCB. PEC may speak with pt.  

## 2018-08-04 ENCOUNTER — Telehealth (INDEPENDENT_AMBULATORY_CARE_PROVIDER_SITE_OTHER): Payer: Medicare Other | Admitting: Internal Medicine

## 2018-08-04 ENCOUNTER — Inpatient Hospital Stay: Payer: Medicare Other | Admitting: Internal Medicine

## 2018-08-04 DIAGNOSIS — N182 Chronic kidney disease, stage 2 (mild): Secondary | ICD-10-CM

## 2018-08-04 DIAGNOSIS — I130 Hypertensive heart and chronic kidney disease with heart failure and stage 1 through stage 4 chronic kidney disease, or unspecified chronic kidney disease: Secondary | ICD-10-CM | POA: Diagnosis not present

## 2018-08-04 DIAGNOSIS — N183 Chronic kidney disease, stage 3 (moderate): Secondary | ICD-10-CM

## 2018-08-04 DIAGNOSIS — I509 Heart failure, unspecified: Secondary | ICD-10-CM

## 2018-08-04 DIAGNOSIS — N184 Chronic kidney disease, stage 4 (severe): Secondary | ICD-10-CM | POA: Diagnosis not present

## 2018-08-04 DIAGNOSIS — I129 Hypertensive chronic kidney disease with stage 1 through stage 4 chronic kidney disease, or unspecified chronic kidney disease: Secondary | ICD-10-CM

## 2018-08-04 DIAGNOSIS — I5032 Chronic diastolic (congestive) heart failure: Secondary | ICD-10-CM | POA: Diagnosis not present

## 2018-08-04 DIAGNOSIS — E1122 Type 2 diabetes mellitus with diabetic chronic kidney disease: Secondary | ICD-10-CM | POA: Diagnosis not present

## 2018-08-04 DIAGNOSIS — J449 Chronic obstructive pulmonary disease, unspecified: Secondary | ICD-10-CM | POA: Diagnosis not present

## 2018-08-04 DIAGNOSIS — J9621 Acute and chronic respiratory failure with hypoxia: Secondary | ICD-10-CM | POA: Diagnosis not present

## 2018-08-04 DIAGNOSIS — Z09 Encounter for follow-up examination after completed treatment for conditions other than malignant neoplasm: Secondary | ICD-10-CM

## 2018-08-04 NOTE — Patient Instructions (Signed)
Virtual Visit via Video Note  I connected with@ on _0 @ at 11:00 AM EDT by a telephone telemedicine application and verified that I am speaking with the correct person using two identifiers.  Location patient: home Location provider:work Persons participating in the virtual visit: patient, provider  I discussed the limitations of evaluation and management by telemedicine and the availability of in person appointments. The patient expressed understanding and agreed to proceed.   HPI: hospital follow up.hospital follow up.  Patient was admitted on               To               [facility] .  admitting diagnosis was                                 .    She was discharged on                  To home/SNF and the following medications were newly prescribed:     I have reviewed the records from the hospital admission in detail with patient today.    weighing self daily since home from hospital 162  Lbs unchanged   ROS: See pertinent positives and negatives per HPI.  Past Medical History:  Diagnosis Date  . Acute respiratory failure ([place]) 11/21/2014  . Anxiety   . Arthritis 08/05/2013  . Atherosclerotic peripheral vascular disease with gangrene [facility]) august 2012  . Carotid arterial disease ([place])    a. 01/4173 RICA 08-14, LICA 48-18.  [name] Cerebral infarct ([place]) 08/17/2013  . Chronic diastolic CHF (congestive heart failure) (HCC)    a. EF 50-55% by echo in 04/2015; b. 11/2016 Echo: EF 55-60%, Gr1 DD; c. 05/2018 Echo: EF 60-65%, no rwma.  [name] COPD (chronic obstructive pulmonary disease) ([place])   . Coronary artery disease, occlusive    a. Previous PCI to the LAD, LCx, and RCA in 2010, 2011, 2013, and 2016. All performed in [place]; b. 11/2016 Lexiscan MV: EF 43%, no ischemia->low risk.  . 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 Diabetic diarrhea ([place]) 10/03/2014  . DM type 2, uncontrolled, with renal complications ([place]) 09/15/3147  . Hepatic steatosis    by CT abd pelvis  . History of Ischemic Cardiomyopathy    a. EF 35 to 40% by echo in 2013 but subsequently normalized.  [name] History of kidney stones    at a younger age  . Hyperlipidemia LDL goal <100 02/23/2014  . Hypertensive heart disease   . Osteoporosis, post-menopausal   . 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 Pill esophagitis   . Pleural effusion 10/25/2012   10/2012 CT chest >> small to moderate R lung effusion>> chylothorax, 100% lymphs 10/2013 thoracentesis> cytology negative, WBC 1471, > 90% "small lymphs" 01/2014 CT chest> near complete resolution of pleural effusion, stable lymphadenopathy 08/2014 CT chest [place] ([facility]): small right sided effusion decreased in size, stable mediastinal lymphadenopathy 1.0cm largest, 21m . [place]oidosis ([place] 12/07/2012   [place] 03/2013 Full PFT [facility] > UNACCEPTABLE AND NOT REPRODUCIBLE DATA> Ratio 71% FEV 1 1.02 L (55% pred), FVC 1.31 L (49% pred) could not do lung volumes or DLCO   . Recurrent pleural effusion on right  a. 05/2018 s/p thoracentesis -->755m.  . Sleep apnea   . Stage III chronic kidney disease ([facility]     Past Surgical History:  Procedure Laterality Date  . ABDOMINAL HYSTERECTOMY     at ge 39. secondary to bleeding/partial  . ANTERIOR CERVICAL DECOMP/DISCECTOMY FUSION N/A 10/10/2016   Procedure: Anterior Cervical Discectomy Fusion - Cervical four4- five - Cervical five-Cervical six - Cervical six-Cervical seven;  Surgeon: PEarnie Larsson MD;  [place] [facility]  Service: Neurosurgery;  Laterality: N/A;  . BREAST CYST ASPIRATION Right   . CARPAL TUNNEL RELEASE Bilateral   . [place]     [place]  . COLONOSCOPY WITH PROPOFOL N/A 01/09/2015   Procedure: COLONOSCOPY WITH PROPOFOL;  Surgeon: DLucilla Lame MD;  Location: [facility] ENDOSCOPY;  Service: Endoscopy;  Laterality: N/A;  . CORONARY ANGIOPLASTY WITH  STENT PLACEMENT     [place] [facility]  . EYE SURGERY     bil cataracts  . HERNIA REPAIR     umbilical/ Dr EPat Patrick . OOPHORECTOMY    . PTCA  August 2012   Right Posterior tibial artery , [place]  . TOE AMPUTATION  Sept 2012   Right 2nd toe, [place]    Family History  Problem Relation Age of Onset  . Heart disease Father   . Heart attack Father   . Heart disease Sister   . Heart attack Sister   . Heart disease Brother   . Heart attack Brother   . Tremor Mother   . Asthma Grandchild   . Breast cancer Maternal Aunt     SOCIAL HX: lives at home with husband [place]   Current Outpatient Medications:  .  acetaminophen (TYLENOL) 500 MG tablet, Take 1,000 mg by mouth every 6 (six) hours as needed for mild pain or headache. , Disp: , Rfl:  .  allopurinol (ZYLOPRIM) 100 MG tablet, TAKE 1 TABLET BY MOUTH DAILY, Disp: 30 tablet, Rfl: 0 .  ALPRAZolam (XANAX) 0.25 MG tablet, Take 1 tablet (0.25 mg total) by mouth 2 (two) times daily as needed for anxiety., Disp: 60 tablet, Rfl: 5 .  amLODipine (NORVASC) 10 MG tablet, TAKE 1 TABLET BY MOUTH DAILY, Disp: 30 tablet, Rfl: 0 .  aspirin EC 81 MG tablet, Take 81 mg by mouth daily., Disp: , Rfl:  .  B-D UF III MINI PEN NEEDLES 31G X 5 MM MISC, USE THREE TIMES A DAY, Disp: 270 each, Rfl: 4 .  blood glucose meter kit and supplies, Dispense based on patient and insurance preference. Use up to three times daily as directed. (FOR ICD-10 E11.21), Disp: 1 each, Rfl: 0 .  dicyclomine (BENTYL) 10 MG capsule, Take 1 capsule (10 mg total) by mouth 4 (four) times daily -  before meals and at bedtime., Disp: 90 capsule, Rfl: 0 .  diphenoxylate-atropine (LOMOTIL) 2.5-0.025 MG tablet, Take 1 tablet by mouth 4 (four) times daily as needed for diarrhea or loose stools. (Patient not taking: Reported on 07/12/2018), Disp: 30 tablet, Rfl: 0 .  donepezil (ARICEPT) 10 MG tablet, Take 10 mg by mouth at bedtime., Disp: , Rfl:  .  feeding supplement,  GLUCERNA SHAKE, (GLUCERNA SHAKE) LIQD, Take 237 mLs by mouth 2 (two) times daily between meals., Disp: 60 Can, Rfl: 0 .  fluticasone (FLONASE) 50 MCG/ACT nasal spray, Place 2 sprays into both nostrils daily as needed for rhinitis. (Patient not taking: Reported on 07/12/2018), Disp: 16 g, Rfl: 5 .  furosemide (LASIX) 20 MG  tablet, Take 1 tablet (20 mg total) by mouth daily., Disp: 30 tablet, Rfl: 0 .  furosemide (LASIX) 20 MG tablet, Take 1 tablet (20 mg total) by mouth as needed for fluid or edema (For weight gain of 2 pounds per day). Take 1 tablet (20 mg ) once daily on Monday, Wednesday and Friday., Disp: 30 tablet, Rfl: 2 .  glucose blood (ACCU-CHEK AVIVA PLUS) test strip, USE TO CHECK GLUCOSE THREE TIMES DAILY, Disp: 200 each, Rfl: 5 .  glucose blood test strip, Use to check blood sugars 2 times daily, Disp: 100 each, Rfl: 12 .  guaiFENesin (MUCINEX) 600 MG 12 hr tablet, Take 600 mg by mouth 2 (two) times daily., Disp: , Rfl:  .  HUMALOG MIX 75/25 KWIKPEN (75-25) 100 UNIT/ML Kwikpen, 20 units with breakfast and 30 units with evening meal (Patient taking differently: Inject 20-30 Units into the skin See admin instructions. Inject 20u under the skin with breakfast and 30u under the skin with evening meal), Disp: 15 mL, Rfl: 11 .  hydrALAZINE (APRESOLINE) 25 MG tablet, Take 1 tablet (25 mg total) by mouth 3 (three) times daily., Disp: 270 tablet, Rfl: 3 .  ipratropium-albuterol (DUONEB) 0.5-2.5 (3) MG/3ML SOLN, Take 3 mLs by nebulization every 6 (six) hours as needed (wheezing, shortness of breath)., Disp: 360 mL, Rfl: 5 .  isosorbide mononitrate (IMDUR) 30 MG 24 hr tablet, Take 1 tablet (30 mg total) by mouth daily., Disp: 90 tablet, Rfl: 2 .  nitroGLYCERIN (NITROLINGUAL) 0.4 MG/SPRAY spray, Place 1 spray under the tongue every 5 (five) minutes x 3 doses as needed for chest pain., Disp: 12 g, Rfl: 12 .  ondansetron (ZOFRAN) 4 MG tablet, Take 4 mg by mouth every 8 (eight) hours as needed for nausea or  vomiting., Disp: , Rfl:  .  potassium chloride 20 MEQ TBCR, Take 10 mEq by mouth daily., Disp: 30 tablet, Rfl: 0 .  predniSONE (DELTASONE) 10 MG tablet, Take 10 mg by mouth daily with breakfast., Disp: , Rfl:  .  pregabalin (LYRICA) 100 MG capsule, Take 100 mg by mouth 2 (two) times daily. , Disp: , Rfl:  .  propranolol (INDERAL) 10 MG tablet, Take 1 tablet (10 mg total) by mouth 3 (three) times daily., Disp: 30 tablet, Rfl: 0 .  rosuvastatin (CRESTOR) 20 MG tablet, Take 1 tablet (20 mg total) by mouth daily., Disp: 90 tablet, Rfl: 2 .  senna (SENOKOT) 8.6 MG tablet, Take 1 tablet by mouth 2 (two) times daily as needed for constipation., Disp: , Rfl:  .  ticagrelor (BRILINTA) 60 MG TABS tablet, Take 1 tablet (60 mg total) by mouth 2 (two) times daily., Disp: 60 tablet, Rfl: 11 .  traMADol (ULTRAM) 50 MG tablet, Take 1 tablet (50 mg total) by mouth 2 (two) times daily as needed. (Patient not taking: Reported on 07/12/2018), Disp: 12 tablet, Rfl: 0 No current facility-administered medications for this visit.   Facility-Administered Medications Ordered in Other Visits:  .  albuterol (PROVENTIL) (5 MG/ML) 0.5% nebulizer solution 2.5 mg, 2.5 mg, Nebulization, Once, Derrel Nip, Aris Everts, MD  EXAM:  VITALS per patient if applicable:  GENERAL: alert, oriented, appears well and in no acute distress  HEENT: atraumatic, conjunttiva clear, no obvious abnormalities on inspection of external nose and ears  NECK: normal movements of the head and neck  LUNGS: on inspection no signs of respiratory distress, breathing rate appears normal, no obvious gross SOB, gasping or wheezing  CV: no obvious cyanosis  MS: moves all  visible extremities without noticeable abnormality  PSYCH/NEURO: pleasant and cooperative, no obvious depression or anxiety, speech and thought processing grossly intact  ASSESSMENT AND PLAN:  Discussed the following assessment and plan:  No diagnosis found.     I discussed the  assessment and treatment plan with the patient. The patient was provided an opportunity to ask questions and all were answered. The patient agreed with the plan and demonstrated an understanding of the instructions.   The patient was advised to call back or seek an in-person evaluation if the symptoms worsen or if the condition fails to improve as anticipated.  I provided 15  minutes of non-face-to-face time during this encounter.   [name], MD

## 2018-08-04 NOTE — Progress Notes (Signed)
Virtual Visit via Video Note  I connected with@ on 08/05/18 at 11:00 AM EDT via telephone  and verified that I am speaking with the correct person using two identifiers.  Location patient: home Location provider:work or home office Persons participating in the virtual visit: patient, provider and husband Pamela Collier   I discussed the limitations of evaluation and management by telemedicine and the availability of in person appointments. The patient expressed understanding and agreed to proceed.   HPI:    [name] was contacted today for hospital follow up.  Patient was admitted on   March 20  To [facility]  With shortness of breath.  The admitting diagnosis was CHF secondary to large recurrent bilateral  pleural effusion . She underwent therapeutic /diagnostic thoracentesis of 1.7 L  With immediate improvement in symptoms.  She was discharged on March 22 with daily lasix and  daily weight monitoring,  And fluid restriction to 1800 ml daily.   Patient states that she feels good and has no complaints.  Her appetite has improved and she is not short of breath. She is receiving Home Health services for nursing and PT>   Her husband is dismayed by the redundancy of medications that she now has since discharge since has unused pill packs from prior to her hospitalizations.  And is unclear about her correct dose of furosemide.  The pharmacy and the [facility] RN is apparently unclear as well.   She has been weighing herself daily and  weight is stable at  162 lbs   ROS: See pertinent positives and negatives per HPI.  Past Medical History:  Diagnosis Date  . Acute respiratory failure ([place]) 11/21/2014  . Anxiety   . Arthritis 08/05/2013  . Atherosclerotic peripheral vascular disease with gangrene [facility]) august 2012  . Carotid arterial disease ([place])    a. 05/8297 RICA 37-16, LICA 96-78.  [name] Cerebral infarct ([place]) 08/17/2013  . Chronic diastolic CHF (congestive heart failure) (HCC)    a. EF 50-55% by  echo in 04/2015; b. 11/2016 Echo: EF 55-60%, Gr1 DD; c. 05/2018 Echo: EF 60-65%, no rwma.  [name] COPD (chronic obstructive pulmonary disease) ([place])   . Coronary artery disease, occlusive    a. Previous PCI to the LAD, LCx, and RCA in 2010, 2011, 2013, and 2016. All performed in [place]; b. 11/2016 Lexiscan MV: EF 43%, no ischemia->low risk.  . 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 Diabetic diarrhea ([place]) 10/03/2014  . DM type 2, uncontrolled, with renal complications ([place]) 01/12/8100  . Hepatic steatosis    by CT abd pelvis  . History of Ischemic Cardiomyopathy    a. EF 35 to 40% by echo in 2013 but subsequently normalized.  [name] History of kidney stones    at a younger age  . Hyperlipidemia LDL goal <100 02/23/2014  . Hypertensive heart disease   . Osteoporosis, post-menopausal   . 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 Pill esophagitis   . Pleural effusion 10/25/2012   10/2012 CT chest >> small to moderate R lung effusion>> chylothorax, 100% lymphs 10/2013 thoracentesis> cytology negative, WBC 1471, > 90% "small lymphs" 01/2014 CT chest> near complete resolution of pleural effusion, stable lymphadenopathy 08/2014 CT chest [place] ([facility]): small right sided effusion decreased in size, stable mediastinal lymphadenopathy 1.0cm largest, 11m . 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 Recurrent pleural effusion on right    a. 05/2018 s/p thoracentesis -->771m.  . Sleep apnea   . Stage III chronic kidney disease ([facility]     Past Surgical History:  Procedure Laterality Date  . ABDOMINAL HYSTERECTOMY     at ge 39. secondary to bleeding/partial  . ANTERIOR CERVICAL  DECOMP/DISCECTOMY FUSION N/A 10/10/2016   Procedure: Anterior Cervical Discectomy Fusion - Cervical four4- five - Cervical five-Cervical six - Cervical six-Cervical seven;  Surgeon: PEarnie Larsson MD;  [place]  Service: Neurosurgery;  Laterality: N/A;  . BREAST CYST ASPIRATION Right   . CARPAL TUNNEL RELEASE Bilateral   . [place]     [place]  . COLONOSCOPY WITH PROPOFOL N/A 01/09/2015   Procedure: COLONOSCOPY WITH PROPOFOL;  Surgeon: DLucilla Lame MD;  Location: [facility] ENDOSCOPY;  Service: Endoscopy;  Laterality: N/A;  . CORONARY ANGIOPLASTY WITH STENT PLACEMENT     [place] [facility]  . EYE SURGERY     bil cataracts  . HERNIA REPAIR     umbilical/ Dr EPat Patrick . OOPHORECTOMY    . PTCA  August 2012   Right Posterior tibial artery , [place]  . TOE AMPUTATION  Sept 2012   Right 2nd toe, [place]    Family History  Problem Relation Age of Onset  . Heart disease Father   . Heart attack Father   . Heart disease Sister   . Heart attack Sister   . Heart disease Brother   . Heart attack Brother   . Tremor Mother   . Asthma Grandchild   . Breast cancer Maternal Aunt     SOCIAL HX: married, lives with husband [place]  Current Outpatient Medications:  .  acetaminophen (TYLENOL) 500 MG tablet, Take 1,000 mg by mouth every 6 (six) hours as needed for mild pain or headache. , Disp: , Rfl:  .  allopurinol (ZYLOPRIM) 100 MG tablet, TAKE 1 TABLET BY MOUTH DAILY, Disp: 30 tablet, Rfl: 0 .  ALPRAZolam (XANAX) 0.25 MG tablet, Take 1 tablet (0.25 mg total) by mouth 2 (two) times daily as needed for anxiety., Disp: 60 tablet, Rfl: 5 .  amLODipine (NORVASC) 10 MG tablet, TAKE 1 TABLET BY MOUTH DAILY, Disp: 30 tablet, Rfl: 0 .  aspirin EC 81 MG tablet, Take 81 mg by mouth daily., Disp: , Rfl:  .  B-D UF III MINI PEN NEEDLES 31G X 5 MM MISC, USE THREE TIMES A DAY, Disp: 270 each, Rfl: 4 .  blood glucose meter kit and supplies, Dispense based on patient and insurance  preference. Use up to three times daily as directed. (FOR ICD-10 E11.21), Disp: 1 each, Rfl: 0 .  dicyclomine (BENTYL) 10 MG capsule, Take 1 capsule (10 mg total) by mouth 4 (four) times daily -  before meals and at bedtime., Disp: 90 capsule, Rfl: 0 .  diphenoxylate-atropine (LOMOTIL) 2.5-0.025 MG tablet, Take 1 tablet by mouth 4 (four) times daily as needed for diarrhea or loose stools. (Patient not taking: Reported on 07/12/2018), Disp: 30 tablet, Rfl: 0 .  donepezil (ARICEPT) 10 MG tablet, Take 10 mg by mouth at bedtime., Disp: , Rfl:  .  feeding supplement, GLUCERNA SHAKE, (GLUCERNA SHAKE) LIQD, Take 237 mLs by mouth 2 (two) times  daily between meals., Disp: 60 Can, Rfl: 0 .  fluticasone (FLONASE) 50 MCG/ACT nasal spray, Place 2 sprays into both nostrils daily as needed for rhinitis. (Patient not taking: Reported on 07/12/2018), Disp: 16 g, Rfl: 5 .  furosemide (LASIX) 20 MG tablet, Take 1 tablet (20 mg total) by mouth daily., Disp: 30 tablet, Rfl: 0 .  furosemide (LASIX) 20 MG tablet, Take 1 tablet (20 mg total) by mouth as needed for fluid or edema (For weight gain of 2 pounds per day). Take 1 tablet (20 mg ) once daily on Monday, Wednesday and Friday., Disp: 30 tablet, Rfl: 2 .  glucose blood test strip, Use to check blood sugars 2 times daily, Disp: 100 each, Rfl: 12 .  glucose blood test strip, USE UP TO THREE TIMES DAILY AS DIRECTED, Disp: 300 each, Rfl: 2 .  guaiFENesin (MUCINEX) 600 MG 12 hr tablet, Take 600 mg by mouth 2 (two) times daily., Disp: , Rfl:  .  HUMALOG MIX 75/25 KWIKPEN (75-25) 100 UNIT/ML Kwikpen, 20 units with breakfast and 30 units with evening meal (Patient taking differently: Inject 20-30 Units into the skin See admin instructions. Inject 20u under the skin with breakfast and 30u under the skin with evening meal), Disp: 15 mL, Rfl: 11 .  hydrALAZINE (APRESOLINE) 25 MG tablet, Take 1 tablet (25 mg total) by mouth 3 (three) times daily., Disp: 270 tablet, Rfl: 3 .   ipratropium-albuterol (DUONEB) 0.5-2.5 (3) MG/3ML SOLN, Take 3 mLs by nebulization every 6 (six) hours as needed (wheezing, shortness of breath)., Disp: 360 mL, Rfl: 5 .  isosorbide mononitrate (IMDUR) 30 MG 24 hr tablet, Take 1 tablet (30 mg total) by mouth daily., Disp: 90 tablet, Rfl: 2 .  nitroGLYCERIN (NITROLINGUAL) 0.4 MG/SPRAY spray, Place 1 spray under the tongue every 5 (five) minutes x 3 doses as needed for chest pain., Disp: 12 g, Rfl: 12 .  ondansetron (ZOFRAN) 4 MG tablet, Take 4 mg by mouth every 8 (eight) hours as needed for nausea or vomiting., Disp: , Rfl:  .  potassium chloride 20 MEQ TBCR, Take 10 mEq by mouth daily., Disp: 30 tablet, Rfl: 0 .  predniSONE (DELTASONE) 10 MG tablet, Take 10 mg by mouth daily with breakfast., Disp: , Rfl:  .  pregabalin (LYRICA) 100 MG capsule, Take 100 mg by mouth 2 (two) times daily. , Disp: , Rfl:  .  propranolol (INDERAL) 10 MG tablet, Take 1 tablet (10 mg total) by mouth 3 (three) times daily., Disp: 30 tablet, Rfl: 0 .  rosuvastatin (CRESTOR) 20 MG tablet, Take 1 tablet (20 mg total) by mouth daily., Disp: 90 tablet, Rfl: 2 .  senna (SENOKOT) 8.6 MG tablet, Take 1 tablet by mouth 2 (two) times daily as needed for constipation., Disp: , Rfl:  .  ticagrelor (BRILINTA) 60 MG TABS tablet, Take 1 tablet (60 mg total) by mouth 2 (two) times daily., Disp: 60 tablet, Rfl: 11 .  traMADol (ULTRAM) 50 MG tablet, Take 1 tablet (50 mg total) by mouth 2 (two) times daily as needed. (Patient not taking: Reported on 07/12/2018), Disp: 12 tablet, Rfl: 0 No current facility-administered medications for this visit.   Facility-Administered Medications Ordered in Other Visits:  .  albuterol (PROVENTIL) (5 MG/ML) 0.5% nebulizer solution 2.5 mg, 2.5 mg, Nebulization, Once, Derrel Nip, Aris Everts, MD  EXAM:  VITALS not available   GENERAL: alert, oriented, voice is non pressured and calm without dyspnea    PSYCH/NEURO: pleasant and cooperative, no obvious depression or  anxiety, speech and thought processing grossly intact  ASSESSMENT AND PLAN:  Discussed the following assessment and plan:  Pleural effusion due to CHF (congestive heart failure) [facility])  Hospital discharge follow-up  Type 2 DM with CKD stage 3 and hypertension ([place])     I discussed the assessment and treatment plan with the patient. The patient was provided an opportunity to ask questions and all were answered. The patient agreed with the plan and demonstrated an understanding of the instructions.   The patient was advised to call back or seek an in-person evaluation if the symptoms worsen or if the condition fails to improve as anticipated.  I provided 30 minutes of non-face-to-face time during this encounter.   [name], MD

## 2018-08-05 ENCOUNTER — Telehealth: Payer: Self-pay | Admitting: Internal Medicine

## 2018-08-05 ENCOUNTER — Other Ambulatory Visit: Payer: Self-pay | Admitting: Internal Medicine

## 2018-08-05 ENCOUNTER — Ambulatory Visit: Payer: Medicare Other | Admitting: Family

## 2018-08-05 DIAGNOSIS — J449 Chronic obstructive pulmonary disease, unspecified: Secondary | ICD-10-CM | POA: Diagnosis not present

## 2018-08-05 DIAGNOSIS — N184 Chronic kidney disease, stage 4 (severe): Secondary | ICD-10-CM | POA: Diagnosis not present

## 2018-08-05 DIAGNOSIS — E1122 Type 2 diabetes mellitus with diabetic chronic kidney disease: Secondary | ICD-10-CM | POA: Diagnosis not present

## 2018-08-05 DIAGNOSIS — I130 Hypertensive heart and chronic kidney disease with heart failure and stage 1 through stage 4 chronic kidney disease, or unspecified chronic kidney disease: Secondary | ICD-10-CM | POA: Diagnosis not present

## 2018-08-05 DIAGNOSIS — J9621 Acute and chronic respiratory failure with hypoxia: Secondary | ICD-10-CM | POA: Diagnosis not present

## 2018-08-05 DIAGNOSIS — I5032 Chronic diastolic (congestive) heart failure: Secondary | ICD-10-CM | POA: Diagnosis not present

## 2018-08-05 NOTE — Assessment & Plan Note (Signed)
All labs , imaging studies and progress notes from admission were reviewed with patient today  

## 2018-08-05 NOTE — Telephone Encounter (Signed)
Copied from [place] [id number]. Topic: Quick Communication - See Telephone Encounter >> Aug 05, 2018  8:35 AM Bea Graff, NT wrote: CRM for notification. See Telephone encounter for: 08/05/18. The pharmacy is needing the directions clarified on how the pt should take the furosemide (LASIX) 20 MG tablet as the rx has 2 set of directions.

## 2018-08-05 NOTE — Telephone Encounter (Signed)
One tablet daily alternating with 2 tablets on Mon Wed and Friday  Husband is aware based on coverstation yesterday

## 2018-08-05 NOTE — Telephone Encounter (Signed)
Spoke with the pharmacist and gave them the direction clarification. Pharmacist read back directions with understanding.

## 2018-08-05 NOTE — Telephone Encounter (Signed)
Copied from [place] [id number]. Topic: Quick Communication - Home Health Verbal Orders >> Aug 05, 2018  1:42 PM Berneta Levins wrote: Caller/Agency: Colletta [place] from [place] Number: [phone] Requesting OT/PT/Skilled Nursing/Social Work/Speech Therapy: OT Frequency: 1x a week for one week, 2x a week for 2 weeks, 1x a week for 1 week

## 2018-08-05 NOTE — Telephone Encounter (Signed)
Called and left vm for [facility].  Gave verbal orders for OT as requested.

## 2018-08-05 NOTE — Assessment & Plan Note (Signed)
Renal function remained stable during hospitalization and will need monitoring after several weeks of increased diuretic dose  Lab Results  Component Value Date   CREATININE 1.17 (H) 07/31/2018

## 2018-08-05 NOTE — Assessment & Plan Note (Signed)
S/p therapeutic thoracentesis of 1.7 L.  Transudative, advised to resume daily lasix dose alternating with 2 tablets on MWF

## 2018-08-05 NOTE — Telephone Encounter (Signed)
Pharmacy is needing clarification on directions for the pt's furosemide.

## 2018-08-05 NOTE — Assessment & Plan Note (Signed)
She has been Taking 10 units on mixed insulin in the morning and 30 at night .  Advised to submit BS readings weekly for adjustment of insulin doses

## 2018-08-06 ENCOUNTER — Telehealth (INDEPENDENT_AMBULATORY_CARE_PROVIDER_SITE_OTHER): Payer: Medicare Other | Admitting: Cardiovascular Disease

## 2018-08-06 ENCOUNTER — Other Ambulatory Visit: Payer: Self-pay

## 2018-08-06 DIAGNOSIS — N184 Chronic kidney disease, stage 4 (severe): Secondary | ICD-10-CM | POA: Diagnosis not present

## 2018-08-06 DIAGNOSIS — IMO0002 Reserved for concepts with insufficient information to code with codable children: Secondary | ICD-10-CM

## 2018-08-06 DIAGNOSIS — I130 Hypertensive heart and chronic kidney disease with heart failure and stage 1 through stage 4 chronic kidney disease, or unspecified chronic kidney disease: Secondary | ICD-10-CM | POA: Diagnosis not present

## 2018-08-06 DIAGNOSIS — J9621 Acute and chronic respiratory failure with hypoxia: Secondary | ICD-10-CM | POA: Diagnosis not present

## 2018-08-06 DIAGNOSIS — N183 Chronic kidney disease, stage 3 unspecified: Secondary | ICD-10-CM

## 2018-08-06 DIAGNOSIS — I2510 Atherosclerotic heart disease of native coronary artery without angina pectoris: Secondary | ICD-10-CM | POA: Diagnosis not present

## 2018-08-06 DIAGNOSIS — E782 Mixed hyperlipidemia: Secondary | ICD-10-CM

## 2018-08-06 DIAGNOSIS — E1165 Type 2 diabetes mellitus with hyperglycemia: Secondary | ICD-10-CM

## 2018-08-06 DIAGNOSIS — Z79899 Other long term (current) drug therapy: Secondary | ICD-10-CM | POA: Diagnosis not present

## 2018-08-06 DIAGNOSIS — E1129 Type 2 diabetes mellitus with other diabetic kidney complication: Secondary | ICD-10-CM

## 2018-08-06 DIAGNOSIS — Z7982 Long term (current) use of aspirin: Secondary | ICD-10-CM

## 2018-08-06 DIAGNOSIS — I5032 Chronic diastolic (congestive) heart failure: Secondary | ICD-10-CM

## 2018-08-06 DIAGNOSIS — J449 Chronic obstructive pulmonary disease, unspecified: Secondary | ICD-10-CM | POA: Diagnosis not present

## 2018-08-06 DIAGNOSIS — E1122 Type 2 diabetes mellitus with diabetic chronic kidney disease: Secondary | ICD-10-CM | POA: Diagnosis not present

## 2018-08-06 LAB — MPO/PR-3 (ANCA) ANTIBODIES
ANCA Proteinase 3: <3.5 U/mL (ref 0.0–3.5)
Myeloperoxidase Abs: <9 U/mL (ref 0.0–9.0)

## 2018-08-06 MED ORDER — GLUCOSE BLOOD VI STRP: ORAL_STRIP | 12 refills | Status: DC

## 2018-08-06 MED ORDER — FUROSEMIDE 20 MG PO TABS: 20.0000 mg | ORAL_TABLET | Freq: Every day | ORAL | 3 refills | Status: DC

## 2018-08-06 NOTE — Progress Notes (Signed)
Telephone Visit     Evaluation Performed:  Follow-up visit  This visit type was conducted due to national recommendations for restrictions regarding the COVID-19 Pandemic (e.g. social distancing).  This format is felt to be most appropriate for this patient at this time.  All issues noted in this document were discussed and addressed.  No physical exam was performed (except for noted visual exam findings with Telehealth visits).  See MyChart message from today for the patient's consent to telehealth for [facility].  Date:  08/06/2018   ID:  Pamela Collier, DOB 1941-01-01, MRN [id number]  Patient Location:  [place] [address]   Provider location:   [facility], [place] office  PCP:  [name], MD  Cardiologist:  Ida Rogue, MD   Chief Complaint: Shortness of breath, weakness     History of Present Illness:    Pamela Collier is a 78 y.o. female who presents via audio/video conferencing for a telehealth visit today.   The patient does not symptoms concerning for COVID-19 infection (fever, chills, cough, or new SHORTNESS OF BREATH).  Please refer to prior office visit for complete details:  Patient has a past medical history of Chronic diastolic CHF Chronic diarrhea, episodic diabetes type 2, coronary artery disease,  stent to the LAD, circumflex and RCA,  cardiac catheterizations 2010,2011, 2013, 2016 most recently in [place] with stent to the RCA at that time,  pulmonary sarcoidosis  with moderate airflow restriction, mild restrictive disease on PFTs in 2014, chronic wheezing requiring albuterol nebulizer, pleural effusion presenting 01/11/2015 on CT scan on the right that appear to resolve on follow-up x-rays the end of September 2016,  who presents for routine follow-up of her shortness of breath, chest pain  Recent hospitalization March 2020 for shortness of breath, acute on chronic diastolic CHF with pleural effusion Hospital  records reviewed with the patient in detail Thoracentesis on arrival to the hospital for pleural effusion Was only taking Lasix 3 days a week not Lasix daily as directed Reported having high fluid intake has mouth gets dry Is not weighing herself No family to help Pills in pill pack.  No family locally to help support her or monitor her health issues Reports she is moving in June up to [place]  On Brilinta 60 twice daily, Crestor 20 Propranolol 10 3 times daily Potassium 10 daily Hydralazine 25 3 times daily Amlodipine 10 daily  Long conversation today reports that she is doing well For unclear reasons taking lasix QOD (appears discharge summary was inaccurate listed Lasix twice, 1 prescription recommended daily other prescription every other day)  ABD is not bloated, Weight at home: 162, down from 170s before the hospital Feels her weight is stable Not very active at baseline, no regular exercise program  Troubled by phone calls from insurance company asking for billing information for recent hospital stay  No PND orthopnea  Prior CV studies:   The following studies were reviewed today:  Echocardiogram June 06, 2018 Left ventricle: The cavity size was normal. There was moderate   concentric hypertrophy. Systolic function was normal. The   estimated ejection fraction was in the range of 60% to 65%. Wall   motion was normal; there were no regional wall motion   abnormalities. There was a reduced contribution of atrial   contraction to ventricular filling, due to increased ventricular   diastolic pressure. - Mitral valve: Calcified annulus. - Atrial septum: No defect or patent  foramen ovale was identified. - Pericardium, extracardiac: A trivial pericardial effusion was   identified.  Past Medical History:  Diagnosis Date  . Acute respiratory failure ([place]) 11/21/2014  . Anxiety   . Arthritis 08/05/2013  . Atherosclerotic peripheral vascular disease with gangrene [facility])  august 2012  . Carotid arterial disease ([place])    a. 0/5397 RICA 67-34, LICA 19-37.  [name] Cerebral infarct ([place]) 08/17/2013  . Chronic diastolic CHF (congestive heart failure) (HCC)    a. EF 50-55% by echo in 04/2015; b. 11/2016 Echo: EF 55-60%, Gr1 DD; c. 05/2018 Echo: EF 60-65%, no rwma.  [name] COPD (chronic obstructive pulmonary disease) ([place])   . Coronary artery disease, occlusive    a. Previous PCI to the LAD, LCx, and RCA in 2010, 2011, 2013, and 2016. All performed in [place]; b. 11/2016 Lexiscan MV: EF 43%, no ischemia->low risk.  . 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 Diabetic diarrhea ([place]) 10/03/2014  . DM type 2, uncontrolled, with renal complications ([place]) 9/0/2409  . Hepatic steatosis    by CT abd pelvis  . History of Ischemic Cardiomyopathy    a. EF 35 to 40% by echo in 2013 but subsequently normalized.  [name] History of kidney stones    at a younger age  . Hyperlipidemia LDL goal <100 02/23/2014  . Hypertensive heart disease   . Osteoporosis, post-menopausal   . 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 Pill esophagitis   . Pleural effusion 10/25/2012   10/2012 CT chest >> small to moderate R lung effusion>> chylothorax, 100% lymphs 10/2013 thoracentesis> cytology negative, WBC 1471, > 90% "small lymphs" 01/2014 CT chest> near complete resolution of pleural effusion, stable lymphadenopathy 08/2014 CT chest [place] ([facility]): small right sided effusion decreased in size, stable mediastinal lymphadenopathy 1.0cm largest, 49m  . Pulmonary sarcoidosis ([place]) 12/07/2012   Diagnosed over 20 years ago in [place] with a mediastinal biopsy 03/2013 Full PFT [facility] > UNACCEPTABLE AND NOT REPRODUCIBLE DATA> Ratio 71% FEV 1 1.02 L (55% pred), FVC 1.31 L (49% pred) could not do lung volumes or DLCO   . Recurrent pleural effusion on right    a. 05/2018 s/p thoracentesis -->733ml.  . Sleep apnea   .  Stage III chronic kidney disease [facility])    Past Surgical History:  Procedure Laterality Date  . ABDOMINAL HYSTERECTOMY     at ge 39. secondary to bleeding/partial  . ANTERIOR CERVICAL DECOMP/DISCECTOMY FUSION N/A 10/10/2016   Procedure: Anterior Cervical Discectomy Fusion - Cervical four4- five - Cervical five-Cervical six - Cervical six-Cervical seven;  Surgeon: Earnie Larsson, MD;  Location: [facility];  Service: Neurosurgery;  Laterality: N/A;  . BREAST CYST ASPIRATION Right   . CARPAL TUNNEL RELEASE Bilateral   . CHOLECYSTECTOMY     in [place]   . COLONOSCOPY WITH PROPOFOL N/A 01/09/2015   Procedure: COLONOSCOPY WITH PROPOFOL;  Surgeon: Lucilla Lame, MD;  Location: [facility] ENDOSCOPY;  Service: Endoscopy;  Laterality: N/A;  . CORONARY ANGIOPLASTY WITH STENT PLACEMENT     [place]; [facility]  . EYE SURGERY     bil cataracts  . HERNIA REPAIR     umbilical/ Dr Pat Patrick  . OOPHORECTOMY    . PTCA  August 2012   Right Posterior tibial artery , [place]  . TOE AMPUTATION  Sept 2012   Right 2nd toe, Vickki Muff  No outpatient medications have been marked as taking for the 08/06/18 encounter (Appointment) with Minna Merritts, MD.     Allergies:   Cephalexin; Contrast media [iodinated diagnostic agents]; Doxycycline; Gabapentin; and Sulfa antibiotics   Social History   Tobacco Use  . Smoking status: Never Smoker  . Smokeless tobacco: Never Used  Substance Use Topics  . Alcohol use: No    Alcohol/week: 0.0 standard drinks  . Drug use: No     Family Hx: The patient's family history includes Asthma in her grandchild; Breast cancer in her maternal aunt; Heart attack in her brother, father, and sister; Heart disease in her brother, father, and sister; Tremor in her mother.  ROS:   Please see the history of present illness.    Review of Systems  Constitutional: Negative.        Weakness  Respiratory: Negative.   Cardiovascular: Negative.   Gastrointestinal: Negative.    Musculoskeletal: Positive for back pain.  Neurological: Negative.   Psychiatric/Behavioral: Negative.   All other systems reviewed and are negative.    Labs/Other Tests and Data Reviewed:    Recent Labs: 04/25/2018: TSH 0.758 06/06/2018: Magnesium 2.0 07/30/2018: ALT 12; B Natriuretic Peptide 391.0 07/31/2018: BUN 20; Creatinine, Ser 1.17; Hemoglobin 10.8; Platelets 314; Potassium 4.2; Sodium 140   Recent Lipid Panel Lab Results  Component Value Date/Time   CHOL 179 07/31/2018 04:58 AM   CHOL 134 03/19/2012 12:24 AM   TRIG 192 (H) 07/31/2018 04:58 AM   TRIG 110 03/19/2012 12:24 AM   HDL 49 07/31/2018 04:58 AM   HDL 41 03/19/2012 12:24 AM   CHOLHDL 3.7 07/31/2018 04:58 AM   LDLCALC 92 07/31/2018 04:58 AM   LDLCALC 71 03/19/2012 12:24 AM   LDLDIRECT 102.0 03/14/2016 02:52 PM    Wt Readings from Last 3 Encounters:  08/01/18 163 lb 6.4 oz (74.1 kg)  07/12/18 175 lb (79.4 kg)  07/05/18 175 lb 6.4 oz (79.6 kg)     Exam:    Vital Signs:  There were no vitals taken for this visit.   Well nourished, well developed female in no acute distress.   ASSESSMENT & PLAN:    Chronic diastolic CHF (congestive heart failure) (HCC) Clarified her medications with her She was taking Lasix every other day when she was supposed to take this 20 mg daily She will now start 20 mg daily Previous fluid overload characterized by abdominal swelling She denies having any swelling, but she has moderated her fluid intake somewhat We will check back with her in 2 weeks Recommended daily weights --Long discussion concerning CHF management, dietary changes, fluid moderation  Coronary artery disease involving native coronary artery of native heart without angina pectoris Currently with no symptoms of angina. No further workup at this time. Continue current medication regimen.  DM (diabetes mellitus), type 2, uncontrolled, with renal complications ([place]) We have encouraged continued exercise, careful  diet management in an effort to lose weight.  CKD (chronic kidney disease), stage III (HCC) Creatinine 1.17 At her baseline  Mixed hyperlipidemia Most recent lipids 1 week ago above goal with LDL 92, previously in the 60 range Recommend she stay on her Crestor Also on aspirin with low-dose brilinta    COVID-19 Education: The signs and symptoms of COVID-19 were discussed with the patient and how to seek care for testing (follow up with PCP or arrange E-visit).  The importance of social distancing was discussed today.  Patient Risk:   After full review of this patients clinical  status, I feel that they are at least moderate risk at this time.  Time:   Today, I have spent 25 minutes with the patient with telehealth technology discussing CHF management, salt and fluid management, increase to Lasix pill, monitoring daily weights.     Medication Adjustments/Labs and Tests Ordered: Current medicines are reviewed at length with the patient today.  Concerns regarding medicines are outlined above.   Tests Ordered: No tests ordered   Medication Changes: No changes made   Disposition: Follow-up in 2 weeks time with telemedicine   Signed, Ida Rogue, MD  08/06/2018 3:08 PM    [place] Office [address] [place] [address], [place], [place] [address]

## 2018-08-06 NOTE — Patient Instructions (Addendum)
Need the number for billing in the hospital  [phone] and you will select option 1 for English and then Option 4  Medication Instructions:  Your physician has recommended you make the following change in your medication:  1. INCREASE Furosemide 20 mg once daily    If you need a refill on your cardiac medications before your next appointment, please call your pharmacy.   Lab work: No new labs needed   If you have labs (blood work) drawn today and your tests are completely normal, you will receive your results only by: [name] MyChart Message (if you have MyChart) OR . A paper copy in the mail If you have any lab test that is abnormal or we need to change your treatment, we will call you to review the results.   Testing/Procedures: No new testing needed   Follow-Up: At [facility], you and your health needs are our priority.  As part of our continuing mission to provide you with exceptional heart care, we have created designated Provider Care Teams.  These Care Teams include your primary Cardiologist (physician) and Advanced Practice Providers (APPs -  Physician Assistants and Nurse Practitioners) who all work together to provide you with the care you need, when you need it.  . You will need a follow up appointment in 2 weeks with televisit   . Providers on your designated Care Team:   . Murray Hodgkins, NP . Christell Faith, PA-C . Marrianne Mood, PA-C  Any Other Special Instructions Will Be Listed Below (If Applicable).  For educational health videos Log in to : [email or web address] Or : [email or web address], password : [email or web address]

## 2018-08-09 ENCOUNTER — Ambulatory Visit: Payer: Medicare Other | Admitting: Internal Medicine

## 2018-08-09 NOTE — Progress Notes (Signed)
Scheduled evisit fu

## 2018-08-10 ENCOUNTER — Other Ambulatory Visit: Payer: Self-pay

## 2018-08-10 ENCOUNTER — Other Ambulatory Visit: Payer: Self-pay | Admitting: *Deleted

## 2018-08-10 DIAGNOSIS — E1122 Type 2 diabetes mellitus with diabetic chronic kidney disease: Secondary | ICD-10-CM | POA: Diagnosis not present

## 2018-08-10 DIAGNOSIS — I5032 Chronic diastolic (congestive) heart failure: Secondary | ICD-10-CM | POA: Diagnosis not present

## 2018-08-10 DIAGNOSIS — J449 Chronic obstructive pulmonary disease, unspecified: Secondary | ICD-10-CM | POA: Diagnosis not present

## 2018-08-10 DIAGNOSIS — I130 Hypertensive heart and chronic kidney disease with heart failure and stage 1 through stage 4 chronic kidney disease, or unspecified chronic kidney disease: Secondary | ICD-10-CM | POA: Diagnosis not present

## 2018-08-10 DIAGNOSIS — N184 Chronic kidney disease, stage 4 (severe): Secondary | ICD-10-CM | POA: Diagnosis not present

## 2018-08-10 DIAGNOSIS — J9621 Acute and chronic respiratory failure with hypoxia: Secondary | ICD-10-CM | POA: Diagnosis not present

## 2018-08-10 NOTE — Patient Outreach (Signed)
[facility]) Care Management  08/10/2018  Pamela Collier 03/15/1941 [id number]   Telephone follow up call  Patient admission to [facility] 11/8-11/11 Dx: Near Syncope , fall  Readmission at [facility] 12/15-12/20, Dx: Acute on Chronic Diastolic heart failure .[facility] 12/20-1/13.  Patient with [facility] readmission 1/25-1/28, Dx : Acute pulmonary edema, pleural effusion Acute on chronic respiratory failure with hypoxia. Discharge to [facility] for rehab. Patient readmission to [facility] on 3/20-3/22, DX: Pleural effusion , congestive heart failure malnutrition of moderate degree.    Successful outreach call to patient, she reports feeling pretty good on today , She discussed having home physical therapy on today and doing pretty good. She reports some discomfort of ankle on yesterday but better on today, she reports continuing to wear orthopedic shoe during the day.  Patient states that she talked to her heart doctor on Friday and now she takes her lasix everyday.  Patient states that her son and husband is helping her with keeping her medication straight.  She further discussed :  Heart failure  Patient reports weight today is 162 and daily weights are staying in the 161 to 163 range. Patient denies shortness of breath and no swellings. She discussed now taking lasix daily and having good response. Patient states that she does not add salt to food, and her son is staying with her for a while to help them out  until the virus clears up and she and spouse will move back to [place] with him.  Reinforced worsening signs of heart failure , weight gain of 3 pounds in a day 5 in a week, worsening shortness of breath or swelling.    Placed call to [facility] regarding patient concern regarding needing refill on duoneb requested recently representative states that they need date of when nebulizer purchased prior to being able to run prescription.  Return call to  patient,  reports that she has about 4 vials left .  Patient discussed that she hasn't used it today but wants to have it on hand if needed.  Explained to patient  need for date of purchase of nebulizer machine, she reports in the last 2 years .  Placed return call to [facility] to provide information . Representative states  patient has received a bubble pack with lasix packaged separately to take daily .[facility] will notify patient when prescription ready for duoneb.  1700 Received return call from [facility] reports that they have filled prescription and they are ready for pickup representative states that she called patient and was able to leave a message.  I placed return call to patient, spoke with patient to notify of duoneb prescription is available for pickup she voiced understanding.     Plan  Will plan follow up call in the next week.  Will follow up regarding paramedicine program. Explained Covid 18 symptoms of fever, cough, shortness of breath to seek medical attention.    [name], RN, [place] Management Coordinator  [phone]- Mobile [phone]- Toll Free Main Office

## 2018-08-11 DIAGNOSIS — J9621 Acute and chronic respiratory failure with hypoxia: Secondary | ICD-10-CM | POA: Diagnosis not present

## 2018-08-11 DIAGNOSIS — J449 Chronic obstructive pulmonary disease, unspecified: Secondary | ICD-10-CM | POA: Diagnosis not present

## 2018-08-11 DIAGNOSIS — N184 Chronic kidney disease, stage 4 (severe): Secondary | ICD-10-CM | POA: Diagnosis not present

## 2018-08-11 DIAGNOSIS — I130 Hypertensive heart and chronic kidney disease with heart failure and stage 1 through stage 4 chronic kidney disease, or unspecified chronic kidney disease: Secondary | ICD-10-CM | POA: Diagnosis not present

## 2018-08-11 DIAGNOSIS — I5032 Chronic diastolic (congestive) heart failure: Secondary | ICD-10-CM | POA: Diagnosis not present

## 2018-08-11 DIAGNOSIS — E1122 Type 2 diabetes mellitus with diabetic chronic kidney disease: Secondary | ICD-10-CM | POA: Diagnosis not present

## 2018-08-12 DIAGNOSIS — E1122 Type 2 diabetes mellitus with diabetic chronic kidney disease: Secondary | ICD-10-CM | POA: Diagnosis not present

## 2018-08-12 DIAGNOSIS — I130 Hypertensive heart and chronic kidney disease with heart failure and stage 1 through stage 4 chronic kidney disease, or unspecified chronic kidney disease: Secondary | ICD-10-CM | POA: Diagnosis not present

## 2018-08-12 DIAGNOSIS — J449 Chronic obstructive pulmonary disease, unspecified: Secondary | ICD-10-CM | POA: Diagnosis not present

## 2018-08-12 DIAGNOSIS — N184 Chronic kidney disease, stage 4 (severe): Secondary | ICD-10-CM | POA: Diagnosis not present

## 2018-08-12 DIAGNOSIS — J9621 Acute and chronic respiratory failure with hypoxia: Secondary | ICD-10-CM | POA: Diagnosis not present

## 2018-08-12 DIAGNOSIS — I5032 Chronic diastolic (congestive) heart failure: Secondary | ICD-10-CM | POA: Diagnosis not present

## 2018-08-13 DIAGNOSIS — J449 Chronic obstructive pulmonary disease, unspecified: Secondary | ICD-10-CM | POA: Diagnosis not present

## 2018-08-13 DIAGNOSIS — J9621 Acute and chronic respiratory failure with hypoxia: Secondary | ICD-10-CM | POA: Diagnosis not present

## 2018-08-13 DIAGNOSIS — I130 Hypertensive heart and chronic kidney disease with heart failure and stage 1 through stage 4 chronic kidney disease, or unspecified chronic kidney disease: Secondary | ICD-10-CM | POA: Diagnosis not present

## 2018-08-13 DIAGNOSIS — E1122 Type 2 diabetes mellitus with diabetic chronic kidney disease: Secondary | ICD-10-CM | POA: Diagnosis not present

## 2018-08-13 DIAGNOSIS — N184 Chronic kidney disease, stage 4 (severe): Secondary | ICD-10-CM | POA: Diagnosis not present

## 2018-08-13 DIAGNOSIS — I5032 Chronic diastolic (congestive) heart failure: Secondary | ICD-10-CM | POA: Diagnosis not present

## 2018-08-16 ENCOUNTER — Other Ambulatory Visit: Payer: Self-pay | Admitting: Pharmacist

## 2018-08-16 DIAGNOSIS — I130 Hypertensive heart and chronic kidney disease with heart failure and stage 1 through stage 4 chronic kidney disease, or unspecified chronic kidney disease: Secondary | ICD-10-CM | POA: Diagnosis not present

## 2018-08-16 DIAGNOSIS — N184 Chronic kidney disease, stage 4 (severe): Secondary | ICD-10-CM | POA: Diagnosis not present

## 2018-08-16 DIAGNOSIS — E1122 Type 2 diabetes mellitus with diabetic chronic kidney disease: Secondary | ICD-10-CM | POA: Diagnosis not present

## 2018-08-16 DIAGNOSIS — J449 Chronic obstructive pulmonary disease, unspecified: Secondary | ICD-10-CM | POA: Diagnosis not present

## 2018-08-16 DIAGNOSIS — I5032 Chronic diastolic (congestive) heart failure: Secondary | ICD-10-CM | POA: Diagnosis not present

## 2018-08-16 DIAGNOSIS — J9621 Acute and chronic respiratory failure with hypoxia: Secondary | ICD-10-CM | POA: Diagnosis not present

## 2018-08-16 NOTE — Patient Outreach (Signed)
[facility]) Care Management  08/16/2018  [name] 18-Sep-1940 [id number]  Patient originally referred to [place] [facility] pharmacy for help in re-initiating adherence packaging s/p hospitalizations.   I communicated with [facility] CM RN; she noted that patient and her caregivers are managing medications well, communicating with physicians and care team well, and that no further pharmacy needs are apparent at this time.   Will close case d/t goals of care being met. Please feel free to reach out should future pharmacy needs be identified.   Catie Darnelle Maffucci, PharmD, [place] PGY2 Ambulatory Care Pharmacy Resident, [facility] Phone: [phone]

## 2018-08-17 DIAGNOSIS — I5032 Chronic diastolic (congestive) heart failure: Secondary | ICD-10-CM | POA: Diagnosis not present

## 2018-08-17 DIAGNOSIS — I130 Hypertensive heart and chronic kidney disease with heart failure and stage 1 through stage 4 chronic kidney disease, or unspecified chronic kidney disease: Secondary | ICD-10-CM | POA: Diagnosis not present

## 2018-08-17 DIAGNOSIS — J449 Chronic obstructive pulmonary disease, unspecified: Secondary | ICD-10-CM | POA: Diagnosis not present

## 2018-08-17 DIAGNOSIS — E1122 Type 2 diabetes mellitus with diabetic chronic kidney disease: Secondary | ICD-10-CM | POA: Diagnosis not present

## 2018-08-17 DIAGNOSIS — N184 Chronic kidney disease, stage 4 (severe): Secondary | ICD-10-CM | POA: Diagnosis not present

## 2018-08-17 DIAGNOSIS — J9621 Acute and chronic respiratory failure with hypoxia: Secondary | ICD-10-CM | POA: Diagnosis not present

## 2018-08-19 ENCOUNTER — Other Ambulatory Visit: Payer: Self-pay | Admitting: *Deleted

## 2018-08-19 NOTE — Patient Outreach (Signed)
[facility]) Care Management  08/19/2018  Pamela Collier 1941-05-03 [id number]   Telephone follow up call    Outreach call to patient , her spouse Pamela Collier identified answering the phone , he discussed that patient is resting at this time , she did not sleep well on last night , due to hearing of death in the family. Pamela Collier discussed that he has cancelled home health visits  on today to allow her to rest. Spouse discussed their son still being there and they are making sure that patient is taking her medications. He reports patient has been up the morning and states she has checked her blood pressure, weighed this morning using the monitoring system provided by home health.  Spouse denies any new concerns at this time and recommended checking on patient in the next week.    Plan  Will plan follow up call with patient in the next week.  Encouraged to notify MD of any worsening of shortness of breath, cough fever, swelling, sudden weight gain.   [name], RN, [place] Management Coordinator  [phone]- Mobile [phone]- Toll Free Main Office

## 2018-08-22 NOTE — Progress Notes (Addendum)
Virtual Visit via Telephone Note   This visit type was conducted due to national recommendations for restrictions regarding the COVID-19 Pandemic (e.g. social distancing) in an effort to limit this patient's exposure and mitigate transmission in our community.  Due to her co-morbid illnesses, this patient is at least at moderate risk for complications without adequate follow up.  This format is felt to be most appropriate for this patient at this time.  The patient did not have access to video technology/had technical difficulties with video requiring transitioning to audio format only (telephone).  All issues noted in this document were discussed and addressed.  No physical exam could be performed with this format.  Please refer to the patient's chart for her  consent to telehealth for [facility].    Date:  08/23/2018   ID:  Pamela Collier, DOB [birth year], MRN [id number]  Patient Location:  [place] [address]   Provider location:   [facility] [place] office  PCP:  [name], MD  Cardiologist:  Ida Rogue, MD   Chief Complaint: Shortness of breath, palpitations    History of Present Illness:    Pamela Collier is a 78 y.o. female who presents via audio/video conferencing for a telehealth visit today.   The patient does not symptoms concerning for COVID-19 infection (fever, chills, cough, or new SHORTNESS OF BREATH).  Please refer to prior office visit for complete details:  Patient has a past medical history of Chronic diastolic CHF Chronic diarrhea, episodic diabetes type 2, coronary artery disease,  stent to the LAD, circumflex and RCA,  cardiac catheterizations 2010,2011, 2013, 2016 most recently in [place] with stent to the RCA at that time,  pulmonary sarcoidosis  with moderate airflow restriction, mild restrictive disease on PFTs in 2014, chronic wheezing requiring albuterol nebulizer, pleural effusion presenting 01/11/2015 on CT  scan on the right that appear to resolve on follow-up x-rays the end of September 2016,  who presents for routine follow-up of her shortness of breath, chest pain  Recent hospitalization for acute on chronic diastolic CHF Tele visit 05/14/7251 We increased her Lasix from every other day to every day We will need to monitor for abdominal swelling She was moderating her fluid intake Last weight was 162 pounds, close to baseline weight Down from 170s before the hospital admission  On today's visit, weight 171 pounds  Weight has been elevated past several days Up 10 pounds since last month Take lasix 20 daily  Palpitations at night when lays down, SOB "Pitter patter in the back" Feels somewhat better sitting up  Still with high fluid intake Not much activity while in quarantine  Medications reviewed On Brilinta 60 twice daily, Crestor 20 Propranolol 10 3 times daily Potassium 10 daily Hydralazine 25 3 times daily  Amlodipine 10 daily  Recent records reviewed  hospitalization March 2020 for shortness of breath, acute on chronic diastolic CHF with pleural effusion Thoracentesis on arrival to the hospital for pleural effusion Was only taking Lasix 3 days a week not Lasix daily as directed Reported having high fluid intake has mouth gets dry Pills in pill pack.     Prior CV studies:   The following studies were reviewed today:  Echocardiogram June 06, 2018 Left ventricle: The cavity size was normal. There was moderate   concentric hypertrophy. Systolic function was normal. The   estimated ejection fraction was in the range of 60% to 65%. Wall   motion was  normal; there were no regional wall motion   abnormalities. There was a reduced contribution of atrial   contraction to ventricular filling, due to increased ventricular   diastolic pressure. - Mitral valve: Calcified annulus. - Atrial septum: No defect or patent foramen ovale was identified. - Pericardium, extracardiac:  A trivial pericardial effusion was   identified.  Past Medical History:  Diagnosis Date   Acute respiratory failure ([place]) 11/21/2014   Anxiety    Arthritis 08/05/2013   Atherosclerotic peripheral vascular disease with gangrene [facility]) august 2012   Carotid arterial disease ([place])    a. 05/2246 RICA 25-00, LICA 37-04.   Cerebral infarct ([place]) 08/17/2013   Chronic diastolic CHF (congestive heart failure) ([place])    a. EF 50-55% by echo in 04/2015; b. 11/2016 Echo: EF 55-60%, Gr1 DD; c. 05/2018 Echo: EF 60-65%, no rwma.   COPD (chronic obstructive pulmonary disease) (HCC)    Coronary artery disease, occlusive    a. Previous PCI to the LAD, LCx, and RCA in 2010, 2011, 2013, and 2016. All performed in [place]; b. 11/2016 Lexiscan MV: EF 43%, no ischemia->low risk.   Depression with anxiety 04/03/2012   Diabetic diarrhea ([place]) 10/03/2014   DM type 2, uncontrolled, with renal complications ([place]) 12/18/8914   Hepatic steatosis    by CT abd pelvis   History of Ischemic Cardiomyopathy    a. EF 35 to 40% by echo in 2013 but subsequently normalized.   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   Pill esophagitis    Pleural effusion 10/25/2012   10/2012 CT chest >> small to moderate R lung effusion>> chylothorax, 100% lymphs 10/2013 thoracentesis> cytology negative, WBC 1471, > 90% "small lymphs" 01/2014 CT chest> near complete resolution of pleural effusion, stable lymphadenopathy 08/2014 CT chest [place] ([facility]): small right sided effusion decreased in size, stable mediastinal lymphadenopathy 1.0cm largest, 52m  [place]ulmonary sarcoidosis ([place] 12/07/2012   [place] 03/2013 Full PFT  [facility] > UNACCEPTABLE AND NOT REPRODUCIBLE DATA> Ratio 71% FEV 1 1.02 L (55% pred), FVC 1.31 L (49% pred) could not do lung volumes or DLCO    Recurrent pleural effusion on right    a. 05/2018 s/p thoracentesis -->7538m   Sleep apnea    Stage III chronic kidney disease ([place]   Past Surgical History:  Procedure Laterality Date   ABDOMINAL HYSTERECTOMY     at ge 39. secondary to bleeding/partial   ANTERIOR CERVICAL DECOMP/DISCECTOMY FUSION N/A 10/10/2016   Procedure: Anterior Cervical Discectomy Fusion - Cervical four4- five - Cervical five-Cervical six - Cervical six-Cervical seven;  Surgeon: PoEarnie LarssonMD;  [place] Service: Neurosurgery;  Laterality: N/A;   BREAST CYST ASPIRATION Right    CARPAL TUNNEL RELEASE Bilateral    [place]     [place]  COLONOSCOPY WITH PROPOFOL N/A 01/09/2015   Procedure: COLONOSCOPY WITH PROPOFOL;  Surgeon: DaLucilla LameMD;  Location: [facility] ENDOSCOPY;  Service: Endoscopy;  Laterality: N/A;   CORONARY ANGIOPLASTY WITH STENT [place]ACEMENT     [place] [facility] [facility]   EYE SURGERY     bil cataracts   HERNIA REPAIR  umbilical/ Dr Pat Patrick   OOPHORECTOMY     PTCA  August 2012   Right Posterior tibial artery , [place]   TOE AMPUTATION  Sept 2012   Right 2nd toe, Vickki Muff     Current Meds  Medication Sig   acetaminophen (TYLENOL) 500 MG tablet Take 1,000 mg by mouth every 6 (six) hours as needed for mild pain or headache.    allopurinol (ZYLOPRIM) 100 MG tablet TAKE 1 TABLET BY MOUTH DAILY   ALPRAZolam (XANAX) 0.25 MG tablet Take 1 tablet (0.25 mg total) by mouth 2 (two) times daily as needed for anxiety.   amLODipine (NORVASC) 10 MG tablet TAKE 1 TABLET BY MOUTH DAILY   aspirin EC 81 MG tablet Take 81 mg by mouth daily.   B-D UF III MINI PEN NEEDLES 31G X 5 MM MISC USE THREE TIMES A DAY   blood glucose meter kit and supplies Dispense based on patient and insurance preference. Use up to three times daily  as directed. (FOR ICD-10 E11.21)   dicyclomine (BENTYL) 10 MG capsule Take 1 capsule (10 mg total) by mouth 4 (four) times daily -  before meals and at bedtime.   diphenoxylate-atropine (LOMOTIL) 2.5-0.025 MG tablet Take 1 tablet by mouth 4 (four) times daily as needed for diarrhea or loose stools.   donepezil (ARICEPT) 10 MG tablet Take 10 mg by mouth at bedtime.   feeding supplement, GLUCERNA SHAKE, (GLUCERNA SHAKE) LIQD Take 237 mLs by mouth 2 (two) times daily between meals.   fluticasone (FLONASE) 50 MCG/ACT nasal spray Place 2 sprays into both nostrils daily as needed for rhinitis.   glucose blood test strip USE UP TO THREE TIMES DAILY AS DIRECTED   glucose blood test strip Use to check blood sugars 3 times daily. Dx code: E11.21   guaiFENesin (MUCINEX) 600 MG 12 hr tablet Take 600 mg by mouth 2 (two) times daily.   HUMALOG MIX 75/25 KWIKPEN (75-25) 100 UNIT/ML Kwikpen 20 units with breakfast and 30 units with evening meal (Patient taking differently: Inject 20-30 Units into the skin See admin instructions. Inject 20u under the skin with breakfast and 30u under the skin with evening meal)   hydrALAZINE (APRESOLINE) 25 MG tablet Take 1 tablet (25 mg total) by mouth 3 (three) times daily.   ipratropium-albuterol (DUONEB) 0.5-2.5 (3) MG/3ML SOLN Take 3 mLs by nebulization every 6 (six) hours as needed (wheezing, shortness of breath).   isosorbide mononitrate (IMDUR) 30 MG 24 hr tablet Take 1 tablet (30 mg total) by mouth daily.   nitroGLYCERIN (NITROLINGUAL) 0.4 MG/SPRAY spray Place 1 spray under the tongue every 5 (five) minutes x 3 doses as needed for chest pain.   ondansetron (ZOFRAN) 4 MG tablet Take 4 mg by mouth every 8 (eight) hours as needed for nausea or vomiting.   Potassium Chloride ER 20 MEQ TBCR Take 20 mEq by mouth as directed. Take 1 tablet with each fluid pill that you take.   predniSONE (DELTASONE) 10 MG tablet Take 10 mg by mouth daily with breakfast.    pregabalin (LYRICA) 100 MG capsule Take 100 mg by mouth 2 (two) times daily.    propranolol (INDERAL) 10 MG tablet Take 1 tablet (10 mg total) by mouth 3 (three) times daily.   rosuvastatin (CRESTOR) 20 MG tablet Take 1 tablet (20 mg total) by mouth daily.   senna (SENOKOT) 8.6 MG tablet Take 1 tablet by mouth 2 (two) times daily as needed for constipation.   ticagrelor (BRILINTA) 60 MG  TABS tablet Take 1 tablet (60 mg total) by mouth 2 (two) times daily.   traMADol (ULTRAM) 50 MG tablet Take 1 tablet (50 mg total) by mouth 2 (two) times daily as needed.   [DISCONTINUED] furosemide (LASIX) 20 MG tablet Take 1 tablet (20 mg total) by mouth daily.   [DISCONTINUED] potassium chloride 20 MEQ TBCR Take 10 mEq by mouth daily.   [DISCONTINUED] Potassium Chloride ER 20 MEQ TBCR Take 20 mEq by mouth as directed. Take 1 tablet twice a day with fluid pill until weight is back down to 162 then go back down to 1 tablet once a day with the fluid pill.   [DISCONTINUED] Potassium Chloride ER 20 MEQ TBCR Take 20 mEq by mouth as directed. Take 1 tablet with each fluid pill that you take.     Allergies:   Cephalexin; Contrast media [iodinated diagnostic agents]; Doxycycline; Gabapentin; and Sulfa antibiotics   Social History   Tobacco Use   Smoking status: Never Smoker   Smokeless tobacco: Never Used  Substance Use Topics   Alcohol use: No    Alcohol/week: 0.0 standard drinks   Drug use: No     Family Hx: The patient's family history includes Asthma in her grandchild; Breast cancer in her maternal aunt; Heart attack in her brother, father, and sister; Heart disease in her brother, father, and sister; Tremor in her mother.  ROS:   Please see the history of present illness.    Review of Systems  Constitutional: Negative.        Weakness  Respiratory: Positive for shortness of breath.   Cardiovascular: Negative.   Gastrointestinal: Negative.   Neurological: Negative.     Psychiatric/Behavioral: Negative.   All other systems reviewed and are negative.    Labs/Other Tests and Data Reviewed:    Recent Labs: 04/25/2018: TSH 0.758 06/06/2018: Magnesium 2.0 07/30/2018: ALT 12; B Natriuretic Peptide 391.0 07/31/2018: BUN 20; Creatinine, Ser 1.17; Hemoglobin 10.8; Platelets 314; Potassium 4.2; Sodium 140   Recent Lipid Panel Lab Results  Component Value Date/Time   CHOL 179 07/31/2018 04:58 AM   CHOL 134 03/19/2012 12:24 AM   TRIG 192 (H) 07/31/2018 04:58 AM   TRIG 110 03/19/2012 12:24 AM   HDL 49 07/31/2018 04:58 AM   HDL 41 03/19/2012 12:24 AM   CHOLHDL 3.7 07/31/2018 04:58 AM   LDLCALC 92 07/31/2018 04:58 AM   LDLCALC 71 03/19/2012 12:24 AM   LDLDIRECT 102.0 03/14/2016 02:52 PM    Wt Readings from Last 3 Encounters:  08/23/18 168 lb (76.2 kg)  08/01/18 163 lb 6.4 oz (74.1 kg)  07/12/18 175 lb (79.4 kg)     Exam:    Vital Signs:  Ht 5' 7"  (1.702 m)    Wt 168 lb (76.2 kg)    BMI 26.31 kg/m     Well nourished, well developed female in no acute distress. Blood pressure 130s over 60, rate 60 respiration 16  ASSESSMENT & PLAN:    Chronic diastolic CHF (congestive heart failure) (HCC) 10 pound weight gain since her last clinic visit/telephone visit March 27 Recommend she increase Lasix up to 20 twice daily until weight goes back down to 162 pounds.  Take 1 potassium with IV Lasix Once weight returns to baseline we will go back to Lasix 20 daily Suspect she will need Lasix 20 twice daily on a regular basis if weight does not improve We will call her back in 10 days time --Long discussion concerning CHF management, dietary changes, fluid  moderation  Coronary artery disease involving native coronary artery of native heart without angina pectoris Currently with no symptoms of angina. No further workup at this time. Continue current medication regimen.  Stable  DM (diabetes mellitus), type 2, uncontrolled, with renal complications ([place]) We have  encouraged continued careful diet management in an effort to lose weight. Stable  CKD (chronic kidney disease), stage III ([place]) Last lab work March 21 We will request lab work and follow-up 10 days  Mixed hyperlipidemia Recommend she stay on her Crestor Also on aspirin with low-dose brilinta   COVID-19 Education: The signs and symptoms of COVID-19 were discussed with the patient and how to seek care for testing (follow up with PCP or arrange E-visit).  The importance of social distancing was discussed today.  Patient Risk:   After full review of this patients clinical status, I feel that they are at least moderate risk at this time.  Time:   Today, I have spent 25 minutes with the patient with telehealth technology discussing CHF management, salt and fluid management, increase to Lasix pill, monitoring daily weights.     Medication Adjustments/Labs and Tests Ordered: Current medicines are reviewed at length with the patient today.  Concerns regarding medicines are outlined above.   Tests Ordered: No tests ordered   Medication Changes: No changes made   Disposition: Follow-up in 10 days to 2 weeks time with telemedicine   Signed, Ida Rogue, MD  08/23/2018 2:10 PM    [facility] [id number], [place], [place] [address]

## 2018-08-23 ENCOUNTER — Other Ambulatory Visit: Payer: Self-pay | Admitting: Internal Medicine

## 2018-08-23 ENCOUNTER — Telehealth: Payer: Self-pay

## 2018-08-23 ENCOUNTER — Telehealth (INDEPENDENT_AMBULATORY_CARE_PROVIDER_SITE_OTHER): Payer: Medicare Other | Admitting: Cardiovascular Disease

## 2018-08-23 ENCOUNTER — Other Ambulatory Visit: Payer: Self-pay

## 2018-08-23 VITALS — Ht 67.0 in | Wt 168.0 lb

## 2018-08-23 DIAGNOSIS — J449 Chronic obstructive pulmonary disease, unspecified: Secondary | ICD-10-CM

## 2018-08-23 DIAGNOSIS — I25118 Atherosclerotic heart disease of native coronary artery with other forms of angina pectoris: Secondary | ICD-10-CM | POA: Diagnosis not present

## 2018-08-23 DIAGNOSIS — N183 Chronic kidney disease, stage 3 unspecified: Secondary | ICD-10-CM

## 2018-08-23 DIAGNOSIS — E1121 Type 2 diabetes mellitus with diabetic nephropathy: Secondary | ICD-10-CM

## 2018-08-23 DIAGNOSIS — I6523 Occlusion and stenosis of bilateral carotid arteries: Secondary | ICD-10-CM

## 2018-08-23 DIAGNOSIS — E1122 Type 2 diabetes mellitus with diabetic chronic kidney disease: Secondary | ICD-10-CM | POA: Diagnosis not present

## 2018-08-23 DIAGNOSIS — I130 Hypertensive heart and chronic kidney disease with heart failure and stage 1 through stage 4 chronic kidney disease, or unspecified chronic kidney disease: Secondary | ICD-10-CM | POA: Diagnosis not present

## 2018-08-23 DIAGNOSIS — S96911D Strain of unspecified muscle and tendon at ankle and foot level, right foot, subsequent encounter: Secondary | ICD-10-CM | POA: Diagnosis not present

## 2018-08-23 DIAGNOSIS — J9621 Acute and chronic respiratory failure with hypoxia: Secondary | ICD-10-CM | POA: Diagnosis not present

## 2018-08-23 DIAGNOSIS — I5032 Chronic diastolic (congestive) heart failure: Secondary | ICD-10-CM

## 2018-08-23 DIAGNOSIS — N184 Chronic kidney disease, stage 4 (severe): Secondary | ICD-10-CM | POA: Diagnosis not present

## 2018-08-23 MED ORDER — POTASSIUM CHLORIDE ER 20 MEQ PO TBCR: 20.0000 meq | EXTENDED_RELEASE_TABLET | ORAL | 3 refills | Status: DC

## 2018-08-23 MED ORDER — FLUCONAZOLE 150 MG PO TABS: 150.0000 mg | ORAL_TABLET | Freq: Every day | ORAL | 0 refills | Status: DC

## 2018-08-23 MED ORDER — FUROSEMIDE 20 MG PO TABS: 20.0000 mg | ORAL_TABLET | ORAL | 3 refills | Status: DC

## 2018-08-23 NOTE — Patient Instructions (Addendum)
Needs new scripts as detailed below for lasix and potassium  Would tell pharmacy not to change pill pack, they can supplement with afternoon lasix/potassium in a bottle.  Moderate fluid intake   Medication Instructions:  Please increase the lasix up to 20 mg twice a day (Am and after lunch) until weight is back to 162 pounds Once 162 pounds, then lasix back to once a day with [potassium once a day  When you take two of the lasix, Take two of the potassium  When you take one of the lasix, Take one of the potassium  If you need a refill on your cardiac medications before your next appointment, please call your pharmacy.    Lab work: No new labs needed   If you have labs (blood work) drawn today and your tests are completely normal, you will receive your results only by: [name] MyChart Message (if you have MyChart) OR . A paper copy in the mail If you have any lab test that is abnormal or we need to change your treatment, we will call you to review the results.   Testing/Procedures: No new testing needed   Follow-Up: At [facility], you and your health needs are our priority.  As part of our continuing mission to provide you with exceptional heart care, we have created designated Provider Care Teams.  These Care Teams include your primary Cardiologist (physician) and Advanced Practice Providers (APPs -  Physician Assistants and Nurse Practitioners) who all work together to provide you with the care you need, when you need it.  . You will need a follow up appointment in 10 days  . Providers on your designated Care Team:   . Murray Hodgkins, NP . Christell Faith, PA-C . Marrianne Mood, PA-C  Any Other Special Instructions Will Be Listed Below (If Applicable).  For educational health videos Log in to : [email or web address] Or : [email or web address], password : [email or web address]

## 2018-08-23 NOTE — Progress Notes (Signed)
Patient scheduled for 09/03/2018 @ 3:00pm

## 2018-08-23 NOTE — Telephone Encounter (Signed)
Spoke with patient.  Reviewed medications and Allergies.  Obtained vitals-BP cuff was not working at the time I called.

## 2018-08-24 DIAGNOSIS — J449 Chronic obstructive pulmonary disease, unspecified: Secondary | ICD-10-CM | POA: Diagnosis not present

## 2018-08-24 DIAGNOSIS — I130 Hypertensive heart and chronic kidney disease with heart failure and stage 1 through stage 4 chronic kidney disease, or unspecified chronic kidney disease: Secondary | ICD-10-CM | POA: Diagnosis not present

## 2018-08-24 DIAGNOSIS — I5032 Chronic diastolic (congestive) heart failure: Secondary | ICD-10-CM | POA: Diagnosis not present

## 2018-08-24 DIAGNOSIS — N184 Chronic kidney disease, stage 4 (severe): Secondary | ICD-10-CM | POA: Diagnosis not present

## 2018-08-24 DIAGNOSIS — E1122 Type 2 diabetes mellitus with diabetic chronic kidney disease: Secondary | ICD-10-CM | POA: Diagnosis not present

## 2018-08-24 DIAGNOSIS — J9621 Acute and chronic respiratory failure with hypoxia: Secondary | ICD-10-CM | POA: Diagnosis not present

## 2018-08-25 ENCOUNTER — Other Ambulatory Visit: Payer: Self-pay | Admitting: Internal Medicine

## 2018-08-25 DIAGNOSIS — J9621 Acute and chronic respiratory failure with hypoxia: Secondary | ICD-10-CM | POA: Diagnosis not present

## 2018-08-25 DIAGNOSIS — D860 Sarcoidosis of lung: Secondary | ICD-10-CM | POA: Diagnosis not present

## 2018-08-25 DIAGNOSIS — E509 Vitamin A deficiency, unspecified: Secondary | ICD-10-CM | POA: Diagnosis not present

## 2018-08-25 DIAGNOSIS — I10 Essential (primary) hypertension: Secondary | ICD-10-CM

## 2018-08-25 DIAGNOSIS — E1122 Type 2 diabetes mellitus with diabetic chronic kidney disease: Secondary | ICD-10-CM | POA: Diagnosis not present

## 2018-08-25 DIAGNOSIS — I130 Hypertensive heart and chronic kidney disease with heart failure and stage 1 through stage 4 chronic kidney disease, or unspecified chronic kidney disease: Secondary | ICD-10-CM | POA: Diagnosis not present

## 2018-08-25 DIAGNOSIS — E1140 Type 2 diabetes mellitus with diabetic neuropathy, unspecified: Secondary | ICD-10-CM | POA: Diagnosis not present

## 2018-08-25 DIAGNOSIS — I5032 Chronic diastolic (congestive) heart failure: Secondary | ICD-10-CM | POA: Diagnosis not present

## 2018-08-25 DIAGNOSIS — E1151 Type 2 diabetes mellitus with diabetic peripheral angiopathy without gangrene: Secondary | ICD-10-CM | POA: Diagnosis not present

## 2018-08-25 DIAGNOSIS — M4802 Spinal stenosis, cervical region: Secondary | ICD-10-CM | POA: Diagnosis not present

## 2018-08-25 DIAGNOSIS — F0150 Vascular dementia without behavioral disturbance: Secondary | ICD-10-CM | POA: Diagnosis not present

## 2018-08-25 DIAGNOSIS — M109 Gout, unspecified: Secondary | ICD-10-CM | POA: Diagnosis not present

## 2018-08-25 DIAGNOSIS — F0391 Unspecified dementia with behavioral disturbance: Secondary | ICD-10-CM | POA: Diagnosis not present

## 2018-08-25 DIAGNOSIS — M1990 Unspecified osteoarthritis, unspecified site: Secondary | ICD-10-CM | POA: Diagnosis not present

## 2018-08-25 DIAGNOSIS — F411 Generalized anxiety disorder: Secondary | ICD-10-CM | POA: Diagnosis not present

## 2018-08-25 DIAGNOSIS — R1310 Dysphagia, unspecified: Secondary | ICD-10-CM | POA: Diagnosis not present

## 2018-08-25 DIAGNOSIS — I69391 Dysphagia following cerebral infarction: Secondary | ICD-10-CM | POA: Diagnosis not present

## 2018-08-25 DIAGNOSIS — M5440 Lumbago with sciatica, unspecified side: Secondary | ICD-10-CM | POA: Diagnosis not present

## 2018-08-25 DIAGNOSIS — N184 Chronic kidney disease, stage 4 (severe): Secondary | ICD-10-CM | POA: Diagnosis not present

## 2018-08-25 DIAGNOSIS — D631 Anemia in chronic kidney disease: Secondary | ICD-10-CM | POA: Diagnosis not present

## 2018-08-25 DIAGNOSIS — I2510 Atherosclerotic heart disease of native coronary artery without angina pectoris: Secondary | ICD-10-CM | POA: Diagnosis not present

## 2018-08-25 DIAGNOSIS — J449 Chronic obstructive pulmonary disease, unspecified: Secondary | ICD-10-CM | POA: Diagnosis not present

## 2018-08-25 DIAGNOSIS — L84 Corns and callosities: Secondary | ICD-10-CM | POA: Diagnosis not present

## 2018-08-25 DIAGNOSIS — M810 Age-related osteoporosis without current pathological fracture: Secondary | ICD-10-CM | POA: Diagnosis not present

## 2018-08-25 DIAGNOSIS — K760 Fatty (change of) liver, not elsewhere classified: Secondary | ICD-10-CM | POA: Diagnosis not present

## 2018-08-25 DIAGNOSIS — M5412 Radiculopathy, cervical region: Secondary | ICD-10-CM | POA: Diagnosis not present

## 2018-08-26 ENCOUNTER — Other Ambulatory Visit: Payer: Self-pay | Admitting: *Deleted

## 2018-08-26 NOTE — Patient Outreach (Addendum)
[facility]) Care Management  08/26/2018  KAMBREA CARRASCO [birth year] [id number]   Telephone follow up call  Patient admission to [facility] 11/8-11/11 Dx: Near Syncope , fall  Readmission at [facility] 12/15-12/20, Dx: Acute on Chronic Diastolic heart failure .[facility] 12/20-1/13.  Patient with [facility] readmission 1/25-1/28, Dx : Acute pulmonary edema, pleural effusion Acute on chronic respiratory failure with hypoxia. Discharge to [facility] for rehab. Patient readmission to [facility] on 3/20-3/22, DX: Pleural effusion , congestive heart failure malnutrition of moderate degree.     Successful outreach call to patient ,she discussed that she is doing pretty good.  Patient states that her son  She further discussed : Heart failure  Patient continues to reports weighing daily and keeping track of weights keeping a written record. She reports using telehealth scales through home health and they contact her daily . Patient reports taking her lasix pill twice daily until her weight is down to 162, reports today's weight being 163. Patient unable to discuss previous days weights as she is resting in bed and does not have book with her.  She discussed that her husband and son continue to help her with managing her medication as the doctor wants it  Emphasized MD recommendation to return to lasix and potassium once daily after weight returns to 162 lbs.  Patient discussed she does not use salt on her food, reviewed hidden salt in fast and processed foods. Patient wreports working on limiting fluids because she loves to to drink water discussed fluid limit less than 2 liters states she is drinking less than that.  Reinforced education on heart failure management   Diabetes Patient discussed blood sugars running "good' unable to give previous days readings, denies low blood readings , states blood sugar was 97 on today . She reports tolerating diet and drinking glucerna between meals and  taking insulin as prescribed.   Plan Will plan return call in the next month for continued education and support of chronic medical conditions and sooner based on care coordination needs.  Placed call to [place] regarding referral, she has not received yet,I will follow up heart failure regarding referral. Placed call to heart failure clinic able to leave a message .  [facility]aced call to [facility] patient has planned [facility] visit on 4/16.     THN CM Care Plan Problem One     Most Recent Value  Care Plan Problem One  High risk for readmission related to recent discharge for HF,pleural effusion   Role Documenting the Problem One  Care Management [place] for Problem One  Active  THN Long Term Goal   Patient will not experience a hospital admission over the next 60 days   THN Long Term Goal Start Date  08/03/18 Sudie Grumbling restart ]  Interventions for Problem One Long Term Goal  Reviewed current clinical , reinforced self care  heart failure, daily weights, limiting salt, fluids less than 2 liters, discussed how to determine measurement , continuing activity in home as tolerated.    THN CM Short Term Goal #1   Over the next 30 days patient will be able to report weighing self daily and keeping a record  THN CM Short Term Goal #1 Start Date  08/03/18  [facility] CM Short Term Goal #1 Met Date  08/26/18  THN CM Short Term Goal #2   Patient will be able to identify 2 worsening symptoms of Heart failure over the  next 30 days   THN CM Short Term Goal #2 Start Date  08/03/18  Interventions for Short Term Goal #2  reviewed with teachback sympomts of worsening heart failure .   THN CM Short Term Goal #3  Over the next 30 days patient will be able to identify at least 3 high salt food to avoid over the next 30 days   THN CM Short Term Goal #3 Start Date  08/03/18  [facility] CM Short Term Goal #3 Met Date  08/26/18      [name], RN, [place]  Management Coordinator  [phone]- Mobile [phone]- [place]

## 2018-08-27 ENCOUNTER — Telehealth: Payer: Self-pay

## 2018-08-27 DIAGNOSIS — J449 Chronic obstructive pulmonary disease, unspecified: Secondary | ICD-10-CM | POA: Diagnosis not present

## 2018-08-27 DIAGNOSIS — I5032 Chronic diastolic (congestive) heart failure: Secondary | ICD-10-CM | POA: Diagnosis not present

## 2018-08-27 DIAGNOSIS — N184 Chronic kidney disease, stage 4 (severe): Secondary | ICD-10-CM | POA: Diagnosis not present

## 2018-08-27 DIAGNOSIS — J9621 Acute and chronic respiratory failure with hypoxia: Secondary | ICD-10-CM | POA: Diagnosis not present

## 2018-08-27 DIAGNOSIS — I130 Hypertensive heart and chronic kidney disease with heart failure and stage 1 through stage 4 chronic kidney disease, or unspecified chronic kidney disease: Secondary | ICD-10-CM | POA: Diagnosis not present

## 2018-08-27 DIAGNOSIS — E1122 Type 2 diabetes mellitus with diabetic chronic kidney disease: Secondary | ICD-10-CM | POA: Diagnosis not present

## 2018-08-27 NOTE — Telephone Encounter (Signed)
Copied from [place] [phone]. Topic: General - Other >> Aug 27, 2018  3:50 PM Leward Quan A wrote: Reason for CRM: Pamella Pert from [employer] called to check on status of CMN faxed over earlier this week to be completed and returned for this patient. In case it was not received he will fax another form on 08/27/2018. Please call Ph# [phone] if there are any questions

## 2018-08-30 DIAGNOSIS — N184 Chronic kidney disease, stage 4 (severe): Secondary | ICD-10-CM | POA: Diagnosis not present

## 2018-08-30 DIAGNOSIS — I5032 Chronic diastolic (congestive) heart failure: Secondary | ICD-10-CM | POA: Diagnosis not present

## 2018-08-30 DIAGNOSIS — J449 Chronic obstructive pulmonary disease, unspecified: Secondary | ICD-10-CM | POA: Diagnosis not present

## 2018-08-30 DIAGNOSIS — J9621 Acute and chronic respiratory failure with hypoxia: Secondary | ICD-10-CM | POA: Diagnosis not present

## 2018-08-30 DIAGNOSIS — I130 Hypertensive heart and chronic kidney disease with heart failure and stage 1 through stage 4 chronic kidney disease, or unspecified chronic kidney disease: Secondary | ICD-10-CM | POA: Diagnosis not present

## 2018-08-30 DIAGNOSIS — E1122 Type 2 diabetes mellitus with diabetic chronic kidney disease: Secondary | ICD-10-CM | POA: Diagnosis not present

## 2018-08-30 NOTE — Telephone Encounter (Signed)
Form has been completed and faxed.

## 2018-08-31 NOTE — Progress Notes (Signed)
Virtual Visit via Telephone Note   This visit type was conducted due to national recommendations for restrictions regarding the COVID-19 Pandemic (e.g. social distancing) in an effort to limit this patient's exposure and mitigate transmission in our community.  Due to her co-morbid illnesses, this patient is at least at moderate risk for complications without adequate follow up.  This format is felt to be most appropriate for this patient at this time.  The patient did not have access to video technology/had technical difficulties with video requiring transitioning to audio format only (telephone).  All issues noted in this document were discussed and addressed.  No physical exam could be performed with this format.  Please refer to the patient's chart for her  consent to telehealth for [facility].    Date:  08/31/2018   ID:  Pamela Collier, DOB [birth year], MRN [id number]  Patient Location:  [place] [address]   Provider location:   [facility], [place] office  PCP:  [name], MD  Cardiologist:  Ida Rogue, MD   Chief Complaint: Shortness of breath, palpitations   History of Present Illness:    Pamela Collier is a 78 y.o. female who presents via audio/video conferencing for a telehealth visit today.   The patient does not symptoms concerning for COVID-19 infection (fever, chills, cough, or new SHORTNESS OF BREATH).  Please refer to prior office visit for complete details:  Patient has a past medical history of Chronic diastolic CHF Chronic diarrhea, episodic diabetes type 2, coronary artery disease,  stent to the LAD, circumflex and RCA,  cardiac catheterizations 2010,2011, 2013, 2016 most recently in [place] with stent to the RCA at that time,  pulmonary sarcoidosis  with moderate airflow restriction, mild restrictive disease on PFTs in 2014, chronic wheezing requiring albuterol nebulizer, pleural effusion presenting 01/11/2015 on CT  scan on the right that appear to resolve on follow-up x-rays the end of September 2016,  who presents for routine follow-up of her shortness of breath, chest pain  Recent hospitalization for acute on chronic diastolic CHF Tele visit 12/23/4816 We increased her Lasix from every other day to every day Previously with abdominal swelling moderating her fluid intake Ideal weight is 162 pounds  170s before the hospital admission  On last tele-visit weight 171 pounds  Weight has been elevated for several days Up 10 pounds over the month Take lasix 20 daily  She reported having palpitations at night when lays down, SOB "Pitter patter in the back" Feels somewhat better sitting up Still with high fluid intake  Medications reviewed On Brilinta 60 twice daily, Crestor 20 Propranolol 10 3 times daily Potassium 10 daily Hydralazine 25 3 times daily  Amlodipine 10 daily  We had recommended that she increase Lasix up to 20 twice a day until weight went back to 162 pounds When weight was back down to low 160 range she was to decrease Lasix back to 20 daily Recommended  she decrease her fluid intake  Today, weight 170 still Taking lasix 20 and potassium Did not follow BID dosing recommendation based on our visit 8 days  Legs dont feel swollen ABD feels swelling Walking "a little better" Previous fall, has a brace on right foot. Had severe pain, in foot  Has a visiting nurse, Comes once a week, unclear which agency  Recent records reviewed  hospitalization March 2020 for shortness of breath, acute on chronic diastolic CHF with pleural effusion Thoracentesis on arrival  to the hospital for pleural effusion Was only taking Lasix 3 days a week not Lasix daily as directed Reported having high fluid intake has mouth gets dry Pills in pill pack.     Prior CV studies:   The following studies were reviewed today:  Echocardiogram June 06, 2018 Left ventricle: The cavity size was normal.  There was moderate   concentric hypertrophy. Systolic function was normal. The   estimated ejection fraction was in the range of 60% to 65%. Wall   motion was normal; there were no regional wall motion   abnormalities. There was a reduced contribution of atrial   contraction to ventricular filling, due to increased ventricular   diastolic pressure. - Mitral valve: Calcified annulus. - Atrial septum: No defect or patent foramen ovale was identified. - Pericardium, extracardiac: A trivial pericardial effusion was   identified.  Past Medical History:  Diagnosis Date  . Acute respiratory failure ([place]) 11/21/2014  . Anxiety   . Arthritis 08/05/2013  . Atherosclerotic peripheral vascular disease with gangrene [facility]) august 2012  . Carotid arterial disease ([place])    a. 05/5398 RICA 86-76, LICA 19-50.  [name] Cerebral infarct ([place]) 08/17/2013  . Chronic diastolic CHF (congestive heart failure) (HCC)    a. EF 50-55% by echo in 04/2015; b. 11/2016 Echo: EF 55-60%, Gr1 DD; c. 05/2018 Echo: EF 60-65%, no rwma.  [name] COPD (chronic obstructive pulmonary disease) ([place])   . Coronary artery disease, occlusive    a. Previous PCI to the LAD, LCx, and RCA in 2010, 2011, 2013, and 2016. All performed in [place]; b. 11/2016 Lexiscan MV: EF 43%, no ischemia->low risk.  . 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 Diabetic diarrhea ([place]) 10/03/2014  . DM type 2, uncontrolled, with renal complications ([place]) 01/12/2670  . Hepatic steatosis    by CT abd pelvis  . History of Ischemic Cardiomyopathy    a. EF 35 to 40% by echo in 2013 but subsequently normalized.  [name] History of kidney stones    at a younger age  . Hyperlipidemia LDL goal <100 02/23/2014  . Hypertensive heart disease   . Osteoporosis, post-menopausal   . 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 Pill esophagitis   . Pleural effusion 10/25/2012   10/2012 CT chest >> small to moderate R  lung effusion>> chylothorax, 100% lymphs 10/2013 thoracentesis> cytology negative, WBC 1471, > 90% "small lymphs" 01/2014 CT chest> near complete resolution of pleural effusion, stable lymphadenopathy 08/2014 CT chest [place] ([facility]): small right sided effusion decreased in size, stable mediastinal lymphadenopathy 1.0cm largest, 25m  . Pulmonary sarcoidosis ([place]) 12/07/2012   Diagnosed over 20 years ago in [place] with a mediastinal biopsy 03/2013 Full PFT [facility] > UNACCEPTABLE AND NOT REPRODUCIBLE DATA> Ratio 71% FEV 1 1.02 L (55% pred), FVC 1.31 L (49% pred) could not do lung volumes or DLCO   . Recurrent pleural effusion on right    a. 05/2018 s/p thoracentesis -->722ml.  . Sleep apnea   . Stage III chronic kidney disease [facility])    Past Surgical History:  Procedure Laterality Date  . ABDOMINAL HYSTERECTOMY     at ge 39. secondary to bleeding/partial  . ANTERIOR CERVICAL DECOMP/DISCECTOMY FUSION N/A 10/10/2016   Procedure: Anterior Cervical Discectomy Fusion - Cervical four4- five - Cervical five-Cervical six - Cervical six-Cervical seven;  Surgeon: Earnie Larsson, MD;  Location: [place];  Service:  Neurosurgery;  Laterality: N/A;  . BREAST CYST ASPIRATION Right   . CARPAL TUNNEL RELEASE Bilateral   . CHOLECYSTECTOMY     in [place]   . COLONOSCOPY WITH PROPOFOL N/A 01/09/2015   Procedure: COLONOSCOPY WITH PROPOFOL;  Surgeon: Lucilla Lame, MD;  Location: [facility] ENDOSCOPY;  Service: Endoscopy;  Laterality: N/A;  . CORONARY ANGIOPLASTY WITH STENT PLACEMENT     [place]; [facility]  . EYE SURGERY     bil cataracts  . HERNIA REPAIR     umbilical/ Dr Pat Patrick  . OOPHORECTOMY    . PTCA  August 2012   Right Posterior tibial artery , [place]  . TOE AMPUTATION  Sept 2012   Right 2nd toe, [place]     No outpatient medications have been marked as taking for the 09/03/18 encounter (Appointment) with Minna Merritts, MD.     Allergies:   Cephalexin; Contrast  media [iodinated diagnostic agents]; Doxycycline; Gabapentin; and Sulfa antibiotics   Social History   Tobacco Use  . Smoking status: Never Smoker  . Smokeless tobacco: Never Used  Substance Use Topics  . Alcohol use: No    Alcohol/week: 0.0 standard drinks  . Drug use: No     Family Hx: The patient's family history includes Asthma in her grandchild; Breast cancer in her maternal aunt; Heart attack in her brother, father, and sister; Heart disease in her brother, father, and sister; Tremor in her mother.  ROS:   Please see the history of present illness.    Review of Systems  Constitutional: Negative.   Respiratory: Positive for shortness of breath.   Cardiovascular: Positive for leg swelling.  Gastrointestinal: Negative.        Abdominal swelling  Neurological: Negative.   Psychiatric/Behavioral: Negative.   All other systems reviewed and are negative.    Labs/Other Tests and Data Reviewed:    Recent Labs: 04/25/2018: TSH 0.758 06/06/2018: Magnesium 2.0 07/30/2018: ALT 12; B Natriuretic Peptide 391.0 07/31/2018: BUN 20; Creatinine, Ser 1.17; Hemoglobin 10.8; Platelets 314; Potassium 4.2; Sodium 140   Recent Lipid Panel Lab Results  Component Value Date/Time   CHOL 179 07/31/2018 04:58 AM   CHOL 134 03/19/2012 12:24 AM   TRIG 192 (H) 07/31/2018 04:58 AM   TRIG 110 03/19/2012 12:24 AM   HDL 49 07/31/2018 04:58 AM   HDL 41 03/19/2012 12:24 AM   CHOLHDL 3.7 07/31/2018 04:58 AM   LDLCALC 92 07/31/2018 04:58 AM   LDLCALC 71 03/19/2012 12:24 AM   LDLDIRECT 102.0 03/14/2016 02:52 PM    Wt Readings from Last 3 Encounters:  08/23/18 168 lb (76.2 kg)  08/01/18 163 lb 6.4 oz (74.1 kg)  07/12/18 175 lb (79.4 kg)     Exam:    Vital Signs:   130s over 60, pulses 60s respirations 16  Well nourished, well developed female in no acute distress.   ASSESSMENT & PLAN:    Chronic diastolic CHF (congestive heart failure) (HCC) --Long discussion concerning CHF management,  dietary changes, fluid moderation She did not increase Lasix up to 20 twice daily with potassium as we had recommended on her last visit 10 days ago She continues to take Lasix 20 daily with potassium Reports her weight continues to run low 170 range still with abdominal bloating presumably from fluid Does not feel she has much leg edema -We will talk with pharmacy and with visiting nurse, try to get her Lasix 40 in the morning with potassium.  She would prefer to  do it all at once rather than twice daily dosing May need to fix her pill pack or deliver separate Lasix  Coronary artery disease involving native coronary artery of native heart without angina pectoris Denies angina, breathing stable, more active On aspirin with Brilinta  DM (diabetes mellitus), type 2, uncontrolled, with renal complications ([place]) Discussed her diet, reports that she is eating everything in the house that she is under quarantine, trying to restrict herself  CKD (chronic kidney disease), stage III ([place]) Last lab work March 21 Weight still running high 170 pounds, likely with extra fluid Will hold off repeat  blood work at this time  Mixed hyperlipidemia Recommend she stay on her Crestor   COVID-19 Education: The signs and symptoms of COVID-19 were discussed with the patient and how to seek care for testing (follow up with PCP or arrange E-visit).  The importance of social distancing was discussed today.  Patient Risk:   After full review of this patients clinical status, I feel that they are at least moderate risk at this time.  Time:   Today, I have spent 25 minutes with the patient with telehealth technology discussing CHF management, salt and fluid management, increase to Lasix pill, monitoring daily weights.     Medication Adjustments/Labs and Tests Ordered: Current medicines are reviewed at length with the patient today.  Concerns regarding medicines are outlined above.   Tests Ordered: No tests  ordered   Medication Changes: No changes made   Disposition: Follow-up in 3 weeks with telemedicine   Signed, Ida Rogue, MD  08/31/2018 6:31 PM    [place] Office [address] [place] [address], [place], [place] [address]

## 2018-09-01 ENCOUNTER — Telehealth: Payer: Self-pay | Admitting: Internal Medicine

## 2018-09-01 ENCOUNTER — Other Ambulatory Visit (HOSPITAL_COMMUNITY): Payer: Self-pay

## 2018-09-01 DIAGNOSIS — I5032 Chronic diastolic (congestive) heart failure: Secondary | ICD-10-CM | POA: Diagnosis not present

## 2018-09-01 DIAGNOSIS — E1122 Type 2 diabetes mellitus with diabetic chronic kidney disease: Secondary | ICD-10-CM | POA: Diagnosis not present

## 2018-09-01 DIAGNOSIS — N184 Chronic kidney disease, stage 4 (severe): Secondary | ICD-10-CM | POA: Diagnosis not present

## 2018-09-01 DIAGNOSIS — J449 Chronic obstructive pulmonary disease, unspecified: Secondary | ICD-10-CM | POA: Diagnosis not present

## 2018-09-01 DIAGNOSIS — J9621 Acute and chronic respiratory failure with hypoxia: Secondary | ICD-10-CM | POA: Diagnosis not present

## 2018-09-01 DIAGNOSIS — I130 Hypertensive heart and chronic kidney disease with heart failure and stage 1 through stage 4 chronic kidney disease, or unspecified chronic kidney disease: Secondary | ICD-10-CM | POA: Diagnosis not present

## 2018-09-01 NOTE — Telephone Encounter (Signed)
Patient is being seen at [facility] for physical therapy and they would like a refill sent for her fungal cream, and they stated they need the office to call them or the patient to discuss the CPAP machine.

## 2018-09-01 NOTE — Progress Notes (Signed)
Today was first visit with Pamela Collier to tell her about the program and get to know what I may help her with.  She states lives with her husband and her son is at the home right now.  She states they control her medications, she know what she takes and when but they remind her all the time she says.  She gives her own insulin shots to her self.  She states knowing what to eat is tough, will refer her to [employer] [facility] due to cardiology notes and talking to [place] Landis Martins.  She states her fluid is good today, she has no fluid in lower extremities.  She tries to stay active but can not do a lot.  She does have nursing coming out and PT.  Her medication are affordable.  She states she weighs daily.  She watches her fluids. She denied chest pain, shortness of breath at rest, headaches or dizziness.  She states she ate waffles and bacon for breakfast this morning.  Talked a little bit about food and drinking water.  She is willing to be part of the program.  Advised her at this time, I will make home visit and telephone calls due to Covid-19, after that will make all home visits.  Gave my information to her and advised her to call if there are any problems, she has weight gain of 3 pounds over night or 5 pounds in a week.  She appeared to understand.  Husband was not at home to discuss things with him.  She is ambulatory by cane.  Will visit for Heart failure and diet.   [place] [phone]

## 2018-09-02 ENCOUNTER — Telehealth (HOSPITAL_COMMUNITY): Payer: Self-pay | Admitting: Licensed Clinical Social Worker

## 2018-09-02 NOTE — Telephone Encounter (Signed)
Patient identified as a candidate to receive 14 heart healthy meals per week for 3 months through [facility] partnership with [employer] [facility].   Completed referral sent in for review.  Anticipate patient will receive first shipment of food in 1-3 business days.  Yenesis Even H. Basilia Stuckert, LCSW Clinical Social Worker [facility] Desk#: [phone] Cell#: [phone]   

## 2018-09-02 NOTE — Telephone Encounter (Signed)
LMTCB. Please transfer Pamela Collier to our office. Need to ask what the fungal cream is for and the pt's symptom's and discuss the pt's CPAP with her.

## 2018-09-03 ENCOUNTER — Telehealth (INDEPENDENT_AMBULATORY_CARE_PROVIDER_SITE_OTHER): Payer: Medicare Other | Admitting: Cardiovascular Disease

## 2018-09-03 ENCOUNTER — Telehealth: Payer: Self-pay

## 2018-09-03 ENCOUNTER — Other Ambulatory Visit: Payer: Self-pay

## 2018-09-03 DIAGNOSIS — I6523 Occlusion and stenosis of bilateral carotid arteries: Secondary | ICD-10-CM | POA: Diagnosis not present

## 2018-09-03 DIAGNOSIS — I25118 Atherosclerotic heart disease of native coronary artery with other forms of angina pectoris: Secondary | ICD-10-CM

## 2018-09-03 DIAGNOSIS — I5032 Chronic diastolic (congestive) heart failure: Secondary | ICD-10-CM

## 2018-09-03 DIAGNOSIS — N183 Chronic kidney disease, stage 3 unspecified: Secondary | ICD-10-CM

## 2018-09-03 DIAGNOSIS — J449 Chronic obstructive pulmonary disease, unspecified: Secondary | ICD-10-CM | POA: Diagnosis not present

## 2018-09-03 DIAGNOSIS — E1121 Type 2 diabetes mellitus with diabetic nephropathy: Secondary | ICD-10-CM

## 2018-09-03 MED ORDER — POTASSIUM CHLORIDE CRYS ER 20 MEQ PO TBCR: 20.0000 meq | EXTENDED_RELEASE_TABLET | Freq: Every day | ORAL | 0 refills | Status: DC

## 2018-09-03 MED ORDER — FUROSEMIDE 40 MG PO TABS: 40.0000 mg | ORAL_TABLET | Freq: Every day | ORAL | 0 refills | Status: DC

## 2018-09-03 NOTE — Progress Notes (Signed)
Called and spoke with patients spouse per release form and reviewed that pharmacy would be in touch regarding medication changes. Also confirmed follow up appointment information and instructed him to please call if they should have any further questions. He verbalized understanding and was appreciative for the follow up call.

## 2018-09-03 NOTE — Telephone Encounter (Signed)
TELEPHONE CALL NOTE  Pamela Collier has been deemed a candidate for a follow-up tele-health visit to limit community exposure during the Covid-19 pandemic. I spoke with the patient via phone to ensure availability of phone/video source, confirm preferred email & phone number, discuss instructions and expectations, and review consent.   I reminded Pamela Collier to be prepared with any vital sign and/or heart rhythm information that could potentially be obtained via home monitoring, at the time of her visit.  Finally, I reminded Pamela Collier to expect an e-mail containing a link for their video-based visit approximately 15 minutes before her visit, or alternatively, a phone call at the time of her visit if her visit is planned to be a phone encounter.  Did the patient verbally consent to treatment as below? Yes  Pamela Collier, CMA 09/03/2018 2:40 PM  CONSENT FOR TELE-HEALTH VISIT - PLEASE REVIEW  I hereby voluntarily request, consent and authorize [facility] and its employed or contracted physicians, physician assistants, nurse practitioners or other licensed health care professionals (the Practitioner), to provide me with telemedicine health care services (the "Services") as deemed necessary by the treating Practitioner. I acknowledge and consent to receive the Services by the Practitioner via telemedicine. I understand that the telemedicine visit will involve communicating with the Practitioner through telephonic communication technology and the disclosure of certain medical information by electronic transmission. I acknowledge that I have been given the opportunity to request an in-person assessment or other available alternative prior to the telemedicine visit and am voluntarily participating in the telemedicine visit.  I understand that I have the right to withhold or withdraw my consent to the use of telemedicine in the course of my care at any time, without affecting my  right to future care or treatment, and that the Practitioner or I may terminate the telemedicine visit at any time. I understand that I have the right to inspect all information obtained and/or recorded in the course of the telemedicine visit and may receive copies of available information for a reasonable fee.  I understand that some of the potential risks of receiving the Services via telemedicine include:  [name] Delay or interruption in medical evaluation due to technological equipment failure or disruption; . Information transmitted may not be sufficient (e.g. poor resolution of images) to allow for appropriate medical decision making by the Practitioner; and/or  . In rare instances, security protocols could fail, causing a breach of personal health information.  Furthermore, I acknowledge that it is my responsibility to provide information about my medical history, conditions and care that is complete and accurate to the best of my ability. I acknowledge that Practitioner's advice, recommendations, and/or decision may be based on factors not within their control, such as incomplete or inaccurate data provided by me or lack of visual representation. I understand that the practice of medicine is not an exact science and that Practitioner makes no warranties or guarantees regarding treatment outcomes. I acknowledge that I will receive a copy of this consent concurrently upon execution via email to the email address I last provided but may also request a printed copy by calling the office of [facility].    I understand that my insurance may be billed for this visit.   I have read or had this consent read to me. . I understand the contents of this consent, which adequately explains the benefits and risks of the Services being provided via telemedicine.  [name]  I have been provided ample opportunity to ask questions regarding this consent and the Services and have had my questions answered to my  satisfaction. . I give my informed consent for the services to be provided through the use of telemedicine in my medical care  By participating in this telemedicine visit I agree to the above.

## 2018-09-03 NOTE — Telephone Encounter (Signed)
   TELEPHONE CALL NOTE  This patient has been deemed a candidate for follow-up tele-health visit to limit community exposure during the Covid-19 pandemic. I spoke with the patient via phone to discuss instructions. The patient was advised to review the section on consent for treatment as well. The patient will receive a phone call 2-3 days prior to their E-Visit at which time consent will be verbally confirmed. A Virtual Office Visit appointment type has been scheduled for 09/06/2018 with Darylene Price FNP.  [name], CMA 09/03/2018 2:39 PM

## 2018-09-03 NOTE — Patient Instructions (Addendum)
Medication Instructions:  Your physician has recommended you make the following change in your medication:  1. INCREASE Furosemide to 40 mg once a day  2. STAY on Potassium 20 mEq once a day  Spoke with pharmacy and pill packs are done for compliance to help reduce readmissions. They were packing her fluid and potassium pills separately but reported that the caregivers did seem confused with instructions. Recommendations by pharmacy were to send in direct instructions and they will make sure she gets the dosing prescribed. They fill her pill packs every 2 weeks and advised that they would reach out to patient with instructions.     Goal weight 162 pounds, current 170 or higher  If you need a refill on your cardiac medications before your next appointment, please call your pharmacy.    Lab work: No new labs needed   If you have labs (blood work) drawn today and your tests are completely normal, you will receive your results only by: [name] MyChart Message (if you have MyChart) OR . A paper copy in the mail If you have any lab test that is abnormal or we need to change your treatment, we will call you to review the results.   Testing/Procedures: No new testing needed   Follow-Up: At [facility], you and your health needs are our priority.  As part of our continuing mission to provide you with exceptional heart care, we have created designated Provider Care Teams.  These Care Teams include your primary Cardiologist (physician) and Advanced Practice Providers (APPs -  Physician Assistants and Nurse Practitioners) who all work together to provide you with the care you need, when you need it.  . Follow up appointment is on Sep 24, 2018 at 2:20 pm   . Providers on your designated Care Team:   . Murray Hodgkins, NP . Christell Faith, PA-C . Marrianne Mood, PA-C  Any Other Special Instructions Will Be Listed Below (If Applicable).  For educational health videos Log in to :  [email or web address] Or : [email or web address], password : [email or web address]

## 2018-09-06 ENCOUNTER — Encounter: Payer: Self-pay | Admitting: Family

## 2018-09-06 ENCOUNTER — Other Ambulatory Visit: Payer: Self-pay

## 2018-09-06 ENCOUNTER — Ambulatory Visit: Payer: Medicare Other | Attending: Family | Admitting: Family

## 2018-09-06 DIAGNOSIS — F439 Reaction to severe stress, unspecified: Secondary | ICD-10-CM

## 2018-09-06 DIAGNOSIS — I10 Essential (primary) hypertension: Secondary | ICD-10-CM

## 2018-09-06 DIAGNOSIS — I5032 Chronic diastolic (congestive) heart failure: Secondary | ICD-10-CM

## 2018-09-06 NOTE — Patient Instructions (Signed)
Continue weighing daily and call for an overnight weight gain of > 2 pounds or a weekly weight gain of >5 pounds. 

## 2018-09-06 NOTE — Progress Notes (Signed)
Virtual Visit via Telephone Note    Evaluation Performed:  Follow-up visit  This visit type was conducted due to national recommendations for restrictions regarding the COVID-19 Pandemic (e.g. social distancing).  This format is felt to be most appropriate for this patient at this time.  All issues noted in this document were discussed and addressed.  No physical exam was performed (except for noted visual exam findings with Video Visits).  Please refer to the patient's chart (MyChart message for video visits and phone note for telephone visits) for the patient's consent to telehealth for [facility]  Date:  09/06/2018   ID:  Pamela Collier, DOB [birth year], MRN [id number]  Patient Location:  [name] [address]   Provider location:   [facility] [place] [address], [place] [address]  PCP:  [name], MD  Cardiologist:  Ida Rogue, MD  Electrophysiologist:  None   Chief Complaint:  Minimal shortness of breath  History of Present Illness:    Pamela Collier is a 78 y.o. female who presents via audio/video conferencing for a telehealth visit today.  Patient verified DOB and address.  The patient does not have symptoms concerning for COVID-19 infection (fever, chills, cough, or new SHORTNESS OF BREATH).   Patient describes minimal shortness of breath upon moderate exertion. She says that this has been present for several years. She has associated rhinorrhea, minimal swelling around her ankles and a "pitter patter in the back" along with this. She denies any weight gain, fatigue, difficulty sleeping, cough, chest pain, palpitations, dizziness or abdominal distention. She had her diuretic increased to 71m daily by her cardiologist on 09/03/2018 and says that her weight has been coming down since that time. She said that prior to my phone call, she had received word that 2 of her family members had recently passed away. She says  that she's not close to her medications or her weight/ BP log and didn't want to get up and get that information.    Prior CV studies:   The following studies were reviewed today:  Echo report from 06/06/2018 reviewed and showed an EF of 60-65%  Past Medical History:  Diagnosis Date  . Acute respiratory failure ([place] 11/21/2014  . Anxiety   . Arthritis 08/05/2013  . Atherosclerotic peripheral vascular disease with gangrene ([facility] august 2012  . Carotid arterial disease (HRegino Ramirez    a. [id number]/7106RICA [id number] LICA 485-46  .Marland KitchenCerebral infarct ([place] 08/17/2013  . Chronic diastolic CHF (congestive heart failure) (HCC)    a. EF 50-55% by echo in 04/2015; b. 11/2016 Echo: EF 55-60%, Gr1 DD; c. 05/2018 Echo: EF 60-65%, no rwma.  .Marland KitchenCOPD (chronic obstructive pulmonary disease) (HP[place]   . Coronary artery disease, occlusive    a. Previous PCI to the LAD, LCx, and RCA in 2010, 2011, 2013, and 2016. [place]. 11/2016 Lexiscan MV: EF 43%, no ischemia->low risk.  . 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 Diabetic diarrhea ([place] 10/03/2014  . DM type 2, uncontrolled, with renal complications ([place] 82/11/348 . Hepatic steatosis    by CT abd pelvis  . History of Ischemic Cardiomyopathy    a. EF 35 to 40% by echo in 2013 but subsequently normalized.  .Marland KitchenHistory of kidney stones    at a younger age  . Hyperlipidemia LDL goal <100 02/23/2014  . Hypertensive heart disease   . Osteoporosis, post-menopausal   . 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 Pill esophagitis   . Pleural effusion 10/25/2012   10/2012 CT chest >> small to moderate R lung effusion>> chylothorax, 100% lymphs 10/2013 thoracentesis> cytology negative, WBC 1471, > 90% "small lymphs" 01/2014 CT chest> near complete resolution of pleural effusion, stable lymphadenopathy 08/2014 CT chest [place] ([facility]): small right sided effusion  decreased in size, stable mediastinal lymphadenopathy 1.0cm largest, 32m . 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 Recurrent pleural effusion on right    a. 05/2018 s/p thoracentesis -->7585m  . Sleep apnea   . Stage III chronic kidney disease ([facility]   Past Surgical History:  Procedure Laterality Date  . ABDOMINAL HYSTERECTOMY     at ge 39. secondary to bleeding/partial  . ANTERIOR CERVICAL DECOMP/DISCECTOMY FUSION N/A 10/10/2016   Procedure: Anterior Cervical Discectomy Fusion - Cervical four4- five - Cervical five-Cervical six - Cervical six-Cervical seven;  Surgeon: PoEarnie LarssonMD;  [place] Service: Neurosurgery;  Laterality: N/A;  . BREAST CYST ASPIRATION Right   . CARPAL TUNNEL RELEASE Bilateral   . [place]     [place] . COLONOSCOPY WITH PROPOFOL N/A 01/09/2015   Procedure: COLONOSCOPY WITH PROPOFOL;  Surgeon: DaLucilla LameMD;  Location: [facility] ENDOSCOPY;  Service: Endoscopy;  Laterality: N/A;  . CORONARY ANGIOPLASTY WITH STENT PLACEMENT     [place]  . EYE SURGERY     bil cataracts  . HERNIA REPAIR     umbilical/ Dr ElPat Patrick. OOPHORECTOMY    . PTCA  August 2012   Right Posterior tibial artery , [place]  . TOE AMPUTATION  Sept 2012   Right 2nd toe, [place]     Current Meds  Medication Sig  . acetaminophen (TYLENOL) 500 MG tablet Take 1,000 mg by mouth every 6 (six) hours as needed for mild pain or headache.   . allopurinol (ZYLOPRIM) 100 MG tablet TAKE 1 TABLET BY MOUTH DAILY  . ALPRAZolam (XANAX) 0.25 MG tablet Take 1 tablet (0.25 mg total) by mouth 2 (two) times daily as needed for anxiety.  . Marland KitchenmLODipine (NORVASC) 10 MG tablet TAKE 1 TABLET BY MOUTH DAILY  . aspirin EC 81 MG tablet Take 81 mg by  mouth daily.  . B-D UF III MINI PEN NEEDLES 31G X 5 MM MISC USE THREE TIMES A DAY  . blood glucose meter kit and supplies Dispense based on patient and insurance preference. Use up to three times daily as directed. (FOR ICD-10 E11.21)  . dicyclomine (BENTYL) 10 MG capsule TAKE 1 CAPSULE BY MOUTH 4 TIMES A DAY  . diphenoxylate-atropine (LOMOTIL) 2.5-0.025 MG tablet Take 1 tablet by mouth 4 (four) times daily as needed for diarrhea or loose stools.  . donepezil (ARICEPT) 10 MG tablet Take 10 mg by mouth at bedtime.  . feeding supplement, GLUCERNA SHAKE, (GLUCERNA SHAKE) LIQD Take 237 mLs by mouth 2 (two) times daily between meals.  . fluconazole (DIFLUCAN) 150 MG tablet Take 1 tablet (150 mg total) by mouth daily.  . fluticasone (FLONASE) 50 MCG/ACT nasal spray Place 2 sprays into both nostrils  daily as needed for rhinitis.  . furosemide (LASIX) 40 MG tablet Take 1 tablet (40 mg total) by mouth daily.  [name] glucose blood test strip USE UP TO THREE TIMES DAILY AS DIRECTED  . glucose blood test strip Use to check blood sugars 3 times daily. Dx code: E11.21  . guaiFENesin (MUCINEX) 600 MG 12 hr tablet Take 600 mg by mouth 2 (two) times daily.  [name] HUMALOG MIX 75/25 KWIKPEN (75-25) 100 UNIT/ML Kwikpen 20 units with breakfast and 30 units with evening meal (Patient taking differently: Inject 20-30 Units into the skin See admin instructions. Inject 20u under the skin with breakfast and 30u under the skin with evening meal)  . hydrALAZINE (APRESOLINE) 25 MG tablet Take 1 tablet (25 mg total) by mouth 3 (three) times daily.  [name] ipratropium-albuterol (DUONEB) 0.5-2.5 (3) MG/3ML SOLN Take 3 mLs by nebulization every 6 (six) hours as needed (wheezing, shortness of breath).  . isosorbide mononitrate (IMDUR) 30 MG 24 hr tablet Take 1 tablet (30 mg total) by mouth daily.  . nitroGLYCERIN (NITROLINGUAL) 0.4 MG/SPRAY spray Place 1 spray under the tongue every 5 (five) minutes x 3 doses as needed for chest pain.  [name]  ondansetron (ZOFRAN) 4 MG tablet Take 4 mg by mouth every 8 (eight) hours as needed for nausea or vomiting.  . potassium chloride SA (K-DUR) 20 MEQ tablet Take 1 tablet (20 mEq total) by mouth daily.  . predniSONE (DELTASONE) 10 MG tablet Take 10 mg by mouth daily with breakfast.  . pregabalin (LYRICA) 100 MG capsule Take 100 mg by mouth 2 (two) times daily.   . propranolol (INDERAL) 10 MG tablet TAKE ONE TABLET BY MOUTH THREE TIMES A DAY  . rosuvastatin (CRESTOR) 20 MG tablet Take 1 tablet (20 mg total) by mouth daily.  [name] senna (SENOKOT) 8.6 MG tablet Take 1 tablet by mouth 2 (two) times daily as needed for constipation.  . ticagrelor (BRILINTA) 60 MG TABS tablet Take 1 tablet (60 mg total) by mouth 2 (two) times daily.  . traMADol (ULTRAM) 50 MG tablet Take 1 tablet (50 mg total) by mouth 2 (two) times daily as needed.     Allergies:   Cephalexin; Contrast media [iodinated diagnostic agents]; Doxycycline; Gabapentin; and Sulfa antibiotics   Social History   Tobacco Use  . Smoking status: Never Smoker  . Smokeless tobacco: Never Used  Substance Use Topics  . Alcohol use: No    Alcohol/week: 0.0 standard drinks  . Drug use: No     Family Hx: The patient's family history includes Asthma in her grandchild; Breast cancer in her maternal aunt; Heart attack in her brother, father, and sister; Heart disease in her brother, father, and sister; Tremor in her mother.  ROS:   Please see the history of present illness.     All other systems reviewed and are negative.   Labs/Other Tests and Data Reviewed:    Recent Labs: 04/25/2018: TSH 0.758 06/06/2018: Magnesium 2.0 07/30/2018: ALT 12; B Natriuretic Peptide 391.0 07/31/2018: BUN 20; Creatinine, Ser 1.17; Hemoglobin 10.8; Platelets 314; Potassium 4.2; Sodium 140   Recent Lipid Panel Lab Results  Component Value Date/Time   CHOL 179 07/31/2018 04:58 AM   CHOL 134 03/19/2012 12:24 AM   TRIG 192 (H) 07/31/2018 04:58 AM   TRIG 110  03/19/2012 12:24 AM   HDL 49 07/31/2018 04:58 AM   HDL 41 03/19/2012 12:24 AM   CHOLHDL 3.7 07/31/2018 04:58 AM   LDLCALC 92 07/31/2018 04:58 AM  [place] 71 03/19/2012 12:24 AM   LDLDIRECT 102.0 03/14/2016 02:52 PM    Wt Readings from Last 3 Encounters:  08/23/18 168 lb (76.2 kg)  08/01/18 163 lb 6.4 oz (74.1 kg)  07/12/18 175 lb (79.4 kg)     Exam:    Vital Signs:  There were no vitals taken for this visit.   Well nourished, well developed female in no  acute distress.   ASSESSMENT & PLAN:    1. Chronic heart failure with preserved ejection fraction- - NYHA class II - euvolemic based on patient's description of symptoms - weighing daily but did not have the information close by although she says that her weight has been declining since her diuretic was increased. Reminded to call for an overnight weight gain of >2 pounds or a weekly weight gain of >5 pounds - not adding salt to her food and is trying to follow a low sodium diet - continues to drink quite a bit of fluids due to her mouth feeling dry - had telemedicine visit with cardiology Rockey Situ) 09/03/2018 - BNP on 07/30/2018 was 391.0  2: HTN- - she says that she checks her BP daily but didn't have that information close by - had telemedicine visit with PCP Derrel Nip) 08/04/2018 - BMP from 07/31/2018 reviewed and showed sodium 140, potassium 4.2, creatinine 1.17 and GFR 53  3: Stress-  - patient says that she received word this morning that 2 of her family members had passed away - emotional support and condolences were offered   COVID-19 Education: The signs and symptoms of COVID-19 were discussed with the patient and how to seek care for testing (follow up with PCP or arrange E-visit).  The importance of social distancing was discussed today.  Patient Risk:   After full review of this patients clinical status, I feel that they are at least moderate risk at this time.  Time:   Today, I have spent 10 minutes with the  patient with telehealth technology discussing weight, diet, medications and symptoms to call about.     Medication Adjustments/Labs and Tests Ordered: Current medicines are reviewed at length with the patient today.  Concerns regarding medicines are outlined above.   Tests Ordered: No orders of the defined types were placed in this encounter.  Medication Changes: No orders of the defined types were placed in this encounter.   Disposition: Follow-up 3 months or sooner for any questions/problems before then.   Signed, Alisa Graff, FNP  09/06/2018 10:06 AM    [facility]

## 2018-09-07 ENCOUNTER — Other Ambulatory Visit: Payer: Self-pay | Admitting: Internal Medicine

## 2018-09-07 DIAGNOSIS — I130 Hypertensive heart and chronic kidney disease with heart failure and stage 1 through stage 4 chronic kidney disease, or unspecified chronic kidney disease: Secondary | ICD-10-CM | POA: Diagnosis not present

## 2018-09-07 DIAGNOSIS — N184 Chronic kidney disease, stage 4 (severe): Secondary | ICD-10-CM | POA: Diagnosis not present

## 2018-09-07 DIAGNOSIS — E1122 Type 2 diabetes mellitus with diabetic chronic kidney disease: Secondary | ICD-10-CM | POA: Diagnosis not present

## 2018-09-07 DIAGNOSIS — J9621 Acute and chronic respiratory failure with hypoxia: Secondary | ICD-10-CM | POA: Diagnosis not present

## 2018-09-07 DIAGNOSIS — I5032 Chronic diastolic (congestive) heart failure: Secondary | ICD-10-CM | POA: Diagnosis not present

## 2018-09-07 DIAGNOSIS — J449 Chronic obstructive pulmonary disease, unspecified: Secondary | ICD-10-CM | POA: Diagnosis not present

## 2018-09-08 ENCOUNTER — Telehealth (HOSPITAL_COMMUNITY): Payer: Self-pay | Admitting: Licensed Clinical Social Worker

## 2018-09-08 DIAGNOSIS — J9621 Acute and chronic respiratory failure with hypoxia: Secondary | ICD-10-CM | POA: Diagnosis not present

## 2018-09-08 DIAGNOSIS — E1122 Type 2 diabetes mellitus with diabetic chronic kidney disease: Secondary | ICD-10-CM | POA: Diagnosis not present

## 2018-09-08 DIAGNOSIS — I130 Hypertensive heart and chronic kidney disease with heart failure and stage 1 through stage 4 chronic kidney disease, or unspecified chronic kidney disease: Secondary | ICD-10-CM | POA: Diagnosis not present

## 2018-09-08 DIAGNOSIS — I5032 Chronic diastolic (congestive) heart failure: Secondary | ICD-10-CM | POA: Diagnosis not present

## 2018-09-08 DIAGNOSIS — J449 Chronic obstructive pulmonary disease, unspecified: Secondary | ICD-10-CM | POA: Diagnosis not present

## 2018-09-08 DIAGNOSIS — N184 Chronic kidney disease, stage 4 (severe): Secondary | ICD-10-CM | POA: Diagnosis not present

## 2018-09-08 IMAGING — DX DG CHEST 2V
2 series · 2 of 2 positions shown · non-contrast
Comparison: Chest x-rays dated 01/08/2016, 06/28/2015, 09/12/2015
and 10/23/2015

CLINICAL DATA: COUGH AND CHEST TIGHTNESS. ACUTE RESPIRATORY
DISTRESS.

EXAM:
CHEST  2 VIEW

[chest pa]
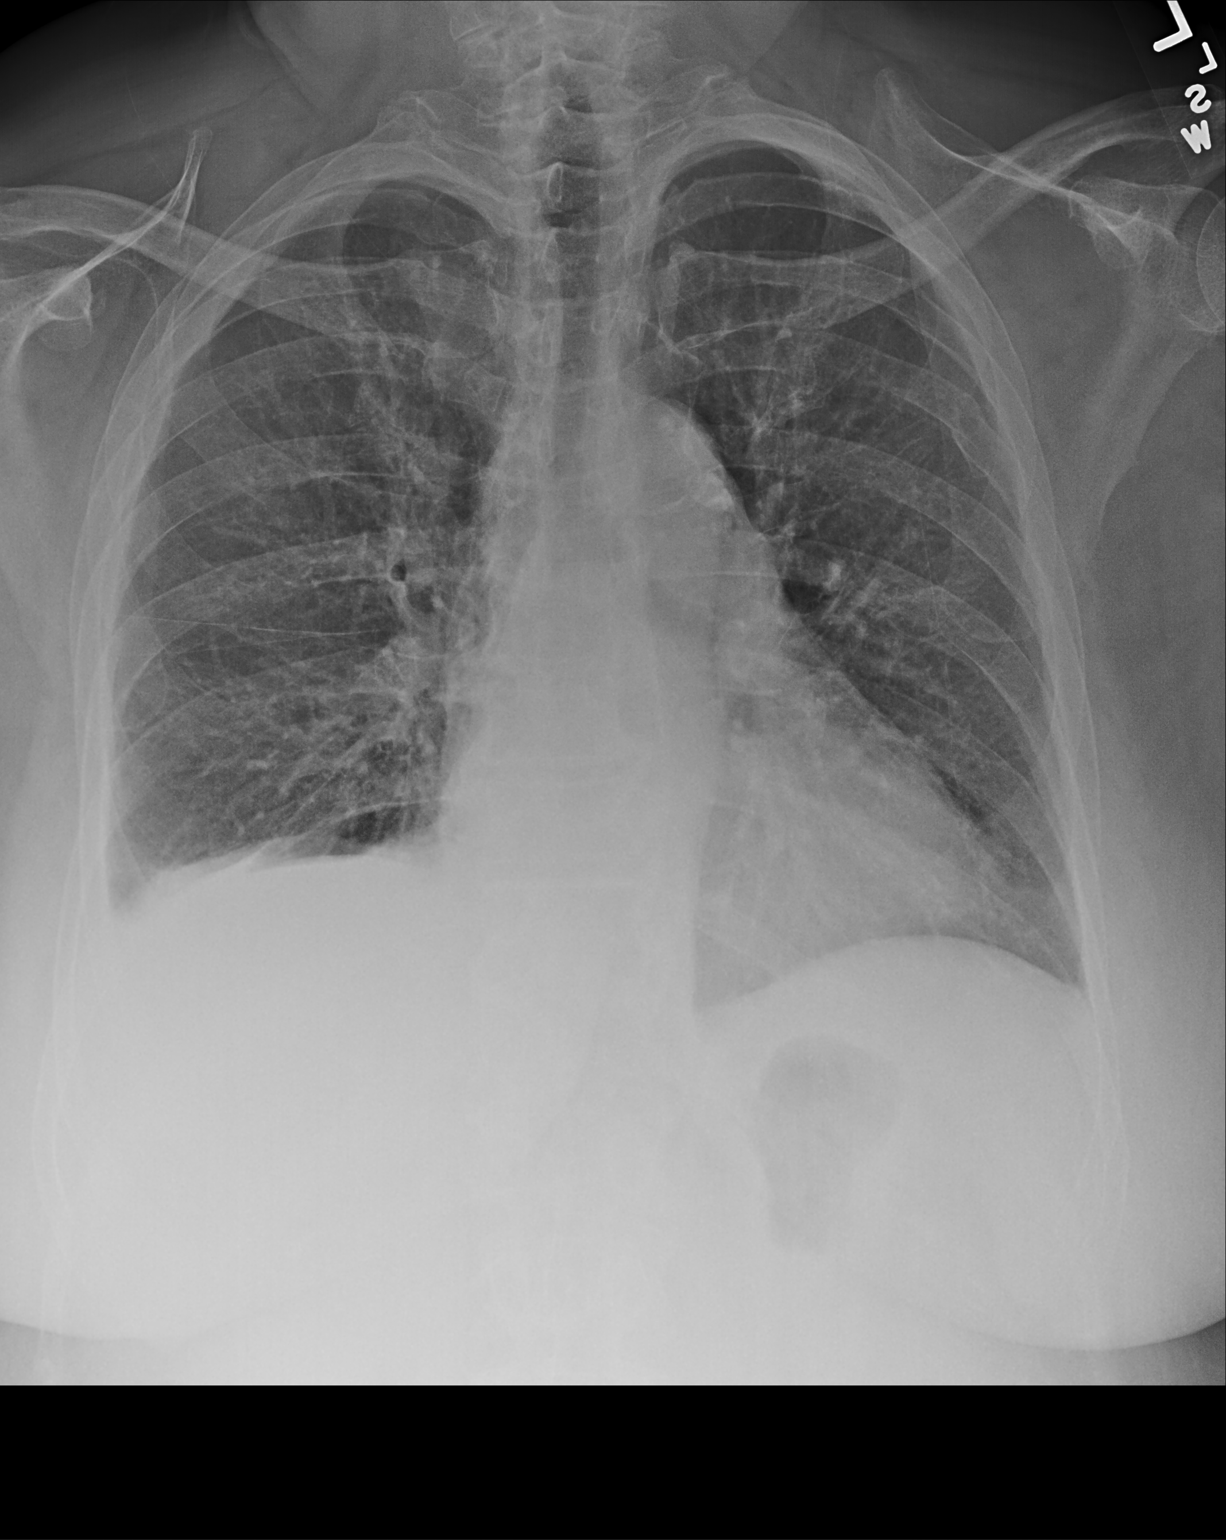

[chest lat]
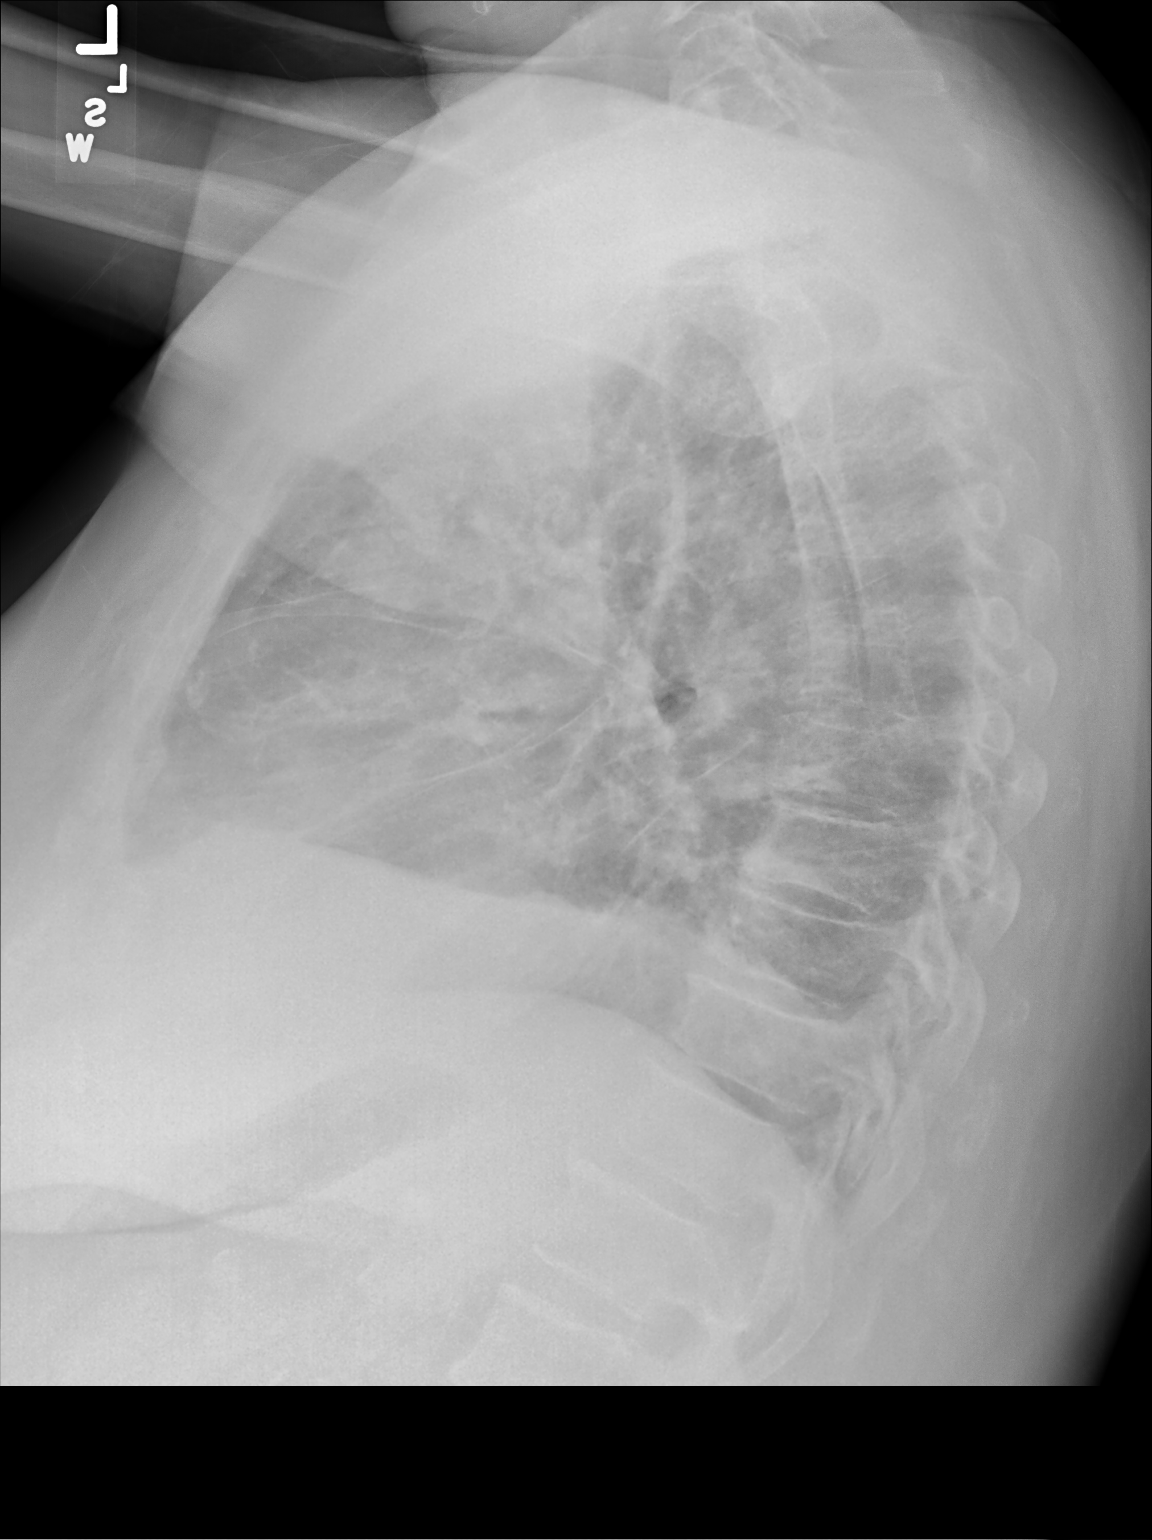

[2 of 2 positions shown; findings below may reference images not displayed]

FINDINGS: Heart size and pulmonary vascularity are normal. Calcification in
the thoracic aorta. Small right pleural effusion which is either
chronic or recurrent.

There are no infiltrates. No acute bone abnormality. There is
peribronchial thickening most prominent on the lateral view.
IMPRESSION: Bronchitic changes. Small right pleural effusion which is either
chronic or recurrent.

Aortic atherosclerosis.

## 2018-09-08 NOTE — Telephone Encounter (Signed)
CSW called to check in regarding first [employer] delivery which was supposed to arrived 04/25.  Patient reports that it has not arrived yet but that she received a notification that it should be arriving today.    CSW encouraged pt to reach out if delivery does not arrive and CSW to call back patient in a few days to check on how she is enjoying the program.  Jorge Ny, [place] Worker [facility] Desk#: [phone] Cell#: [phone]

## 2018-09-09 ENCOUNTER — Telehealth (HOSPITAL_COMMUNITY): Payer: Self-pay

## 2018-09-09 DIAGNOSIS — E1122 Type 2 diabetes mellitus with diabetic chronic kidney disease: Secondary | ICD-10-CM | POA: Diagnosis not present

## 2018-09-09 DIAGNOSIS — I5032 Chronic diastolic (congestive) heart failure: Secondary | ICD-10-CM | POA: Diagnosis not present

## 2018-09-09 DIAGNOSIS — N184 Chronic kidney disease, stage 4 (severe): Secondary | ICD-10-CM | POA: Diagnosis not present

## 2018-09-09 DIAGNOSIS — J449 Chronic obstructive pulmonary disease, unspecified: Secondary | ICD-10-CM | POA: Diagnosis not present

## 2018-09-09 DIAGNOSIS — J9621 Acute and chronic respiratory failure with hypoxia: Secondary | ICD-10-CM | POA: Diagnosis not present

## 2018-09-09 DIAGNOSIS — I130 Hypertensive heart and chronic kidney disease with heart failure and stage 1 through stage 4 chronic kidney disease, or unspecified chronic kidney disease: Secondary | ICD-10-CM | POA: Diagnosis not present

## 2018-09-09 NOTE — Telephone Encounter (Signed)
LMTCB with Pamela Collier from [facility]. Please transfer Pamela Collier to our office.

## 2018-09-09 NOTE — Telephone Encounter (Signed)
FYI  Spoke with Horris Latino from [facility] and she stated that after she left the message the pt had told her that she has an appt with Dr. Derrel Nip tomorrow, so the pt stated that she would speak with Dr. Derrel Nip about the rx for the fungal cream and I will talk with her about her CPAP machine. Horris Latino also wanted to let us know that she has been doing PT with the pt and she has been going good, they have been going outside walking and exercising. She stated that at first the pt was just saying that her right ankle was hurting but now she is having increased right leg and lower back pain.

## 2018-09-09 NOTE — Telephone Encounter (Signed)
Pamela Collier received her [employer] meals and enjoying them.    [place] [phone]

## 2018-09-10 ENCOUNTER — Ambulatory Visit (INDEPENDENT_AMBULATORY_CARE_PROVIDER_SITE_OTHER): Payer: Medicare Other | Admitting: Internal Medicine

## 2018-09-10 ENCOUNTER — Other Ambulatory Visit: Payer: Self-pay

## 2018-09-10 ENCOUNTER — Telehealth: Payer: Self-pay | Admitting: Internal Medicine

## 2018-09-10 ENCOUNTER — Encounter: Payer: Self-pay | Admitting: Internal Medicine

## 2018-09-10 VITALS — Wt 167.0 lb

## 2018-09-10 DIAGNOSIS — E1165 Type 2 diabetes mellitus with hyperglycemia: Secondary | ICD-10-CM | POA: Diagnosis not present

## 2018-09-10 DIAGNOSIS — L603 Nail dystrophy: Secondary | ICD-10-CM

## 2018-09-10 DIAGNOSIS — I5032 Chronic diastolic (congestive) heart failure: Secondary | ICD-10-CM

## 2018-09-10 DIAGNOSIS — N183 Chronic kidney disease, stage 3 unspecified: Secondary | ICD-10-CM

## 2018-09-10 DIAGNOSIS — E1129 Type 2 diabetes mellitus with other diabetic kidney complication: Secondary | ICD-10-CM

## 2018-09-10 DIAGNOSIS — E782 Mixed hyperlipidemia: Secondary | ICD-10-CM

## 2018-09-10 DIAGNOSIS — IMO0002 Reserved for concepts with insufficient information to code with codable children: Secondary | ICD-10-CM

## 2018-09-10 IMAGING — DX DG CHEST 1V PORT
1 series · 1 of 1 positions shown · non-contrast
Comparison: 05/16/2016

CLINICAL DATA: Dyspnea for over 2 weeks.

EXAM:
PORTABLE CHEST 1 VIEW

[chest ap]
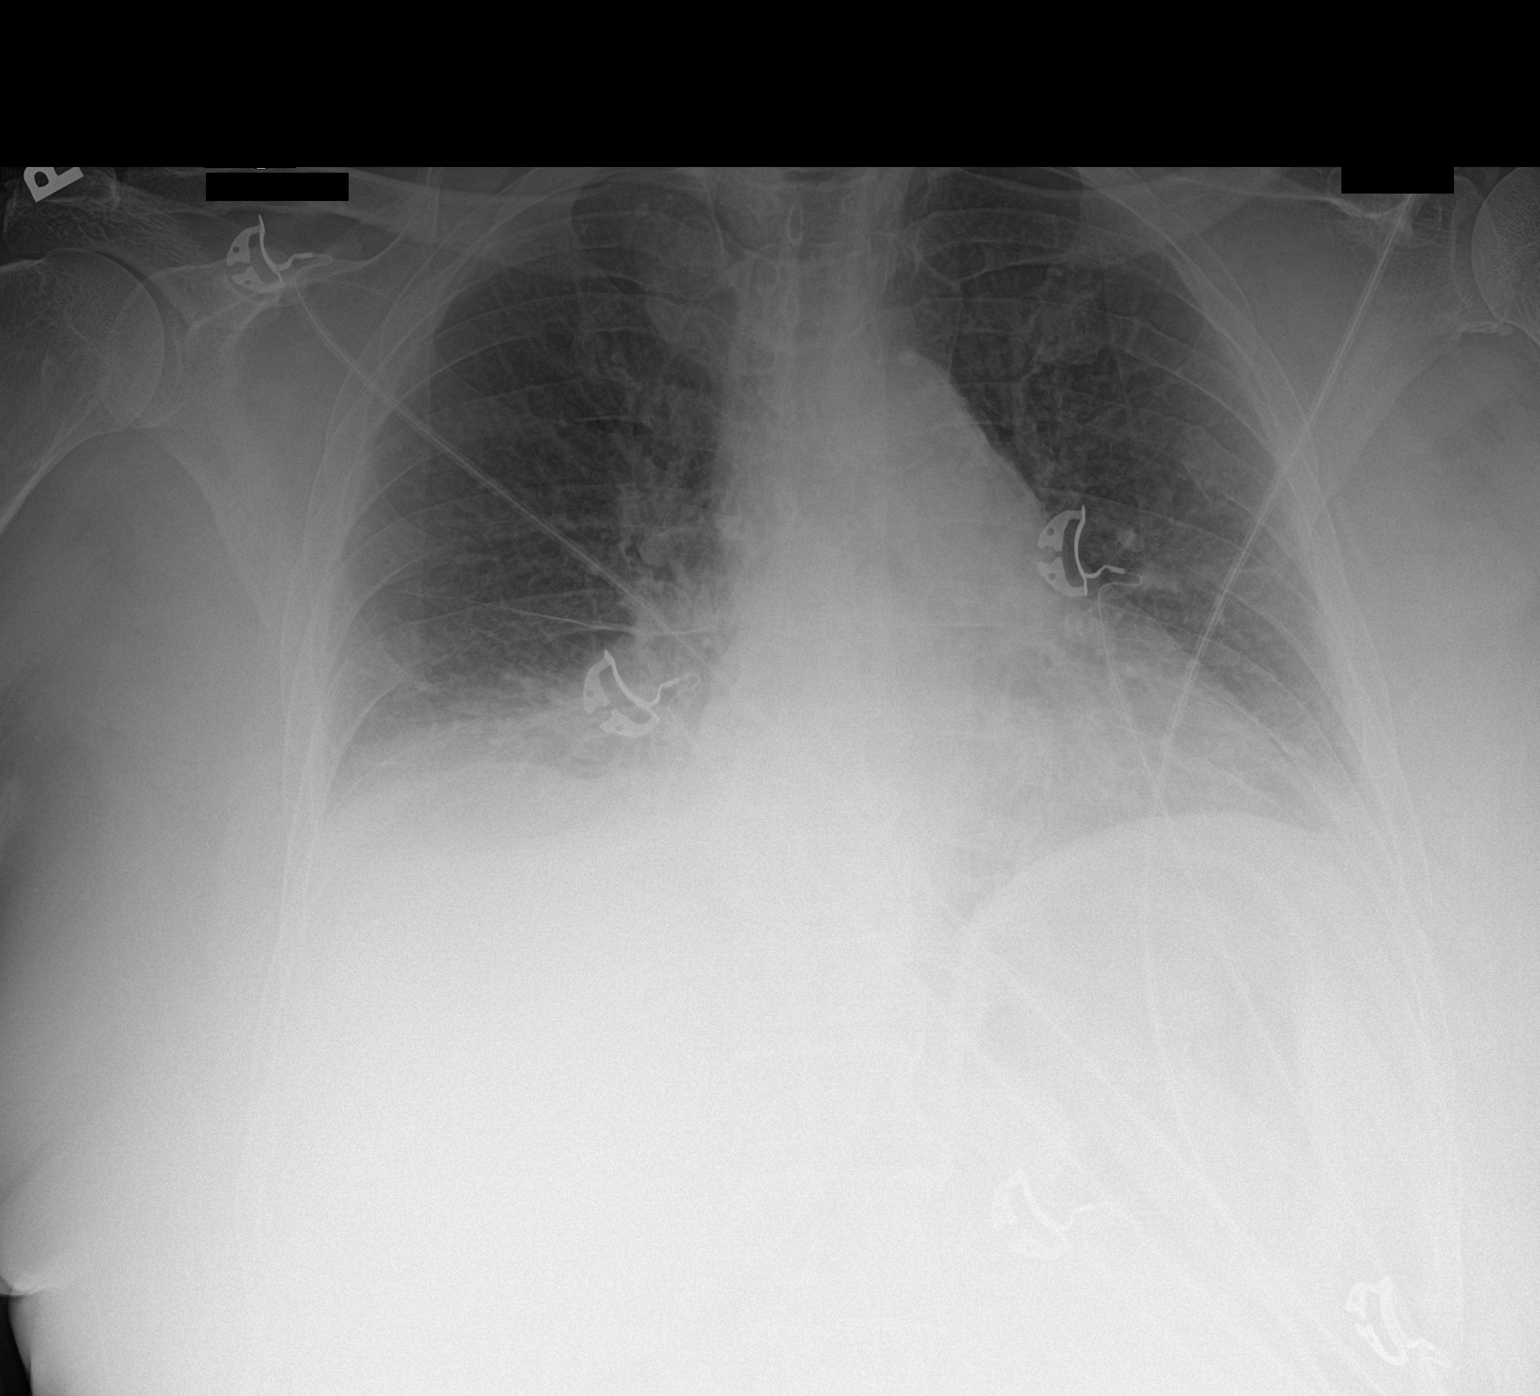

[1 of 1 positions shown; findings below may reference images not displayed]

FINDINGS: Linear right base opacities may represent scarring or atelectasis.
Linear scarring is visible in this area on the prior study of
05/16/2016. Probable small right pleural effusion. Left lung is
clear. Pulmonary vasculature is normal.
IMPRESSION: Small right pleural effusion. Linear scarring or atelectasis in the
right base.

## 2018-09-10 NOTE — Telephone Encounter (Signed)
LMTCB. PEC may speak with pt.  

## 2018-09-10 NOTE — Progress Notes (Signed)
Telephone Note  This visit type was conducted due to national recommendations for restrictions regarding the COVID-19 pandemic (e.g. social distancing).  This format is felt to be most appropriate for this patient at this time.  All issues noted in this document were discussed and addressed.  No physical exam was performed (except for noted visual exam findings with Video Visits).   I attempted to connect with@ on 09/10/18 at  3:00 PM EDT by a video enabled telemedicine application but after 20 minutes of attempted communication switched to straight telephone due to inability to achieve two way audio.  I verified that I am speaking with the correct person using two identifiers. Location patient: home Location provider: work  Persons participating in the virtual visit: patient, provider  Lab Results  Component Value Date   HGBA1C 7.7 (H) 02/10/2018     I discussed the limitations, risks, security and privacy concerns of performing an evaluation and management service by telephone and the availability of in person appointments. I also discussed with the patient that there may be a patient responsible charge related to this service. The patient expressed understanding and agreed to proceed.  Interactive audio and video telecommunications were attempted between this provider and patient, however failed, due to patient having technical difficulties OR patient did not have access to video capability.  We continued and completed visit with audio only.  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  She is accompanied  By her son.  She has had a weight gain and her cardiologist has increased her diuretic to  twice daily.  Her weight is responding and she has not plateau ed yet.  She is avoiding salt but reports  that she drinks " a lot of water" but cannot quantify the amount.  She continues to receive regular visits by an Therapist, sports and now has an Merchandiser, retail weight scale that provides feedback .  DM:  shefeels generally well, is walking in he house for exercise and checking blood sugars twice daily at variable times.  BS have been under 130 fasting and < 150 post prandially.  Has recurrent hypoglyemic events caused by not eatng early enough; low of 178 .  Taking her medications as directed.10 units of mixed insulin in the am and 30 units in the evening  Following a carbohydrate modified diet 6 days per week. Denies numbness, burning and tingling of extremities. Appetite is good.   Requesting treatment for onychomycosis involving several fingernails , not toenails, diagnosed by RN     ROS: See pertinent positives and negatives per HPI.  Past Medical History:  Diagnosis Date  . Acute respiratory failure ([place]) 11/21/2014  . Anxiety   . Arthritis 08/05/2013  . Atherosclerotic peripheral vascular disease with gangrene [facility]) august 2012  . Carotid arterial disease ([place])    a. 12/5629 RICA 49-70, LICA 26-37.  [name] Cerebral infarct ([place]) 08/17/2013  . Chronic diastolic CHF (congestive heart failure) (HCC)    a. EF 50-55% by echo in 04/2015; b. 11/2016 Echo: EF 55-60%, Gr1 DD; c. 05/2018 Echo: EF 60-65%, no rwma.  [name] COPD (chronic obstructive pulmonary disease) ([place])   . Coronary artery disease, occlusive  a. Previous PCI to the LAD, LCx, and RCA in 2010, 2011, 2013, and 2016. All performed in [place]; b. 11/2016 Lexiscan MV: EF 43%, no ischemia->low risk.  . 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 Diabetic diarrhea ([place]) 10/03/2014  . DM type 2, uncontrolled, with renal complications ([place]) 0/07/1592  . Hepatic steatosis    by CT abd pelvis  . History of Ischemic Cardiomyopathy    a. EF 35 to 40% by echo in 2013  but subsequently normalized.  [name] History of kidney stones    at a younger age  . Hyperlipidemia LDL goal <100 02/23/2014  . Hypertensive heart disease   . Osteoporosis, post-menopausal   . 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 Pill esophagitis   . Pleural effusion 10/25/2012   10/2012 CT chest >> small to moderate R lung effusion>> chylothorax, 100% lymphs 10/2013 thoracentesis> cytology negative, WBC 1471, > 90% "small lymphs" 01/2014 CT chest> near complete resolution of pleural effusion, stable lymphadenopathy 08/2014 CT chest [place] ([facility]): small right sided effusion decreased in size, stable mediastinal lymphadenopathy 1.0cm largest, 82m . [place]monary [place]osis ([place] 12/07/2012   [place] 03/2013 Full PFT [facility] > UNACCEPTABLE AND NOT REPRODUCIBLE DATA> Ratio 71% FEV 1 1.02 L (55% pred), FVC 1.31 L (49% pred) could not do lung volumes or DLCO   . Recurrent pleural effusion on right    a. 05/2018 s/p thoracentesis -->7560m  . Sleep apnea   . [facility]    Past Surgical History:  Procedure Laterality Date  . ABDOMINAL HYSTERECTOMY     at ge 39. secondary to bleeding/partial  . ANTERIOR CERVICAL DECOMP/DISCECTOMY FUSION N/A 10/10/2016   Procedure: Anterior Cervical Discectomy Fusion - Cervical four4- five - Cervical five-Cervical six - Cervical six-Cervical seven;  Surgeon: PoEarnie LarssonMD;  [facility] Service: Neurosurgery;  Laterality: N/A;  . BREAST CYST ASPIRATION Right   . CARPAL TUNNEL RELEASE Bilateral   . [place]     [place] . COLONOSCOPY WITH PROPOFOL N/A 01/09/2015   Procedure: COLONOSCOPY WITH PROPOFOL;  Surgeon: DaLucilla LameMD;  Location: [facility] ENDOSCOPY;  Service: Endoscopy;  Laterality: N/A;  . CORONARY ANGIOPLASTY WITH ST[place] PLACEMENT     [place] [facility]  . EYE SURGERY     bil cataracts  . HERNIA REPAIR     umbilical/ Dr ElPat Patrick. OOPHORECTOMY    . PTCA  August 2012   Right Posterior tibial artery , [place]  . TOE AMPUTATION  Sept 2012   Right 2nd toe, [place]    Family History  Problem Relation Age of Onset  . Heart disease Father   . Heart attack Father   . Heart disease Sister   . Heart attack Sister   . Heart disease Brother   . Heart attack Brother   . Tremor Mother   . Asthma Grandchild   . Breast cancer Maternal Aunt     SOCIAL HX: married,  No tobacco or alcohol   Current Outpatient Medications:  .  acetaminophen (TYLENOL) 500 MG tablet, Take 1,000 mg by mouth every 6 (six) hours as needed for mild pain or headache. , Disp: , Rfl:  .  allopurinol (ZYLOPRIM) 100 MG tablet, TAKE 1 TABLET BY MOUTH DAILY, Disp:  30 tablet, Rfl: 0 .  ALPRAZolam (XANAX) 0.25 MG tablet, Take 1 tablet (0.25 mg total) by mouth 2 (two) times daily as needed for anxiety., Disp: 60 tablet, Rfl: 5 .  amLODipine (NORVASC) 10 MG tablet, TAKE 1 TABLET BY MOUTH DAILY, Disp: 30 tablet, Rfl: 0 .  aspirin EC 81 MG tablet, Take 81 mg by mouth daily., Disp: , Rfl:  .  B-D UF III MINI PEN NEEDLES 31G X 5 MM MISC, USE THREE TIMES A DAY, Disp: 270 each, Rfl: 4 .  blood glucose meter kit and supplies, Dispense based on patient and insurance preference. Use up to three times daily as directed. (FOR ICD-10 E11.21), Disp: 1 each, Rfl: 0 .  dicyclomine (BENTYL) 10 MG capsule, TAKE 1 CAPSULE BY MOUTH 4 TIMES A DAY, Disp: 120 capsule, Rfl: 2 .  diphenoxylate-atropine (LOMOTIL) 2.5-0.025 MG tablet, Take 1 tablet by mouth 4 (four) times daily as needed for diarrhea or loose stools., Disp: 30 tablet, Rfl: 0 .  donepezil (ARICEPT) 10 MG tablet, Take 10 mg by mouth at bedtime., Disp: , Rfl:  .  feeding supplement, GLUCERNA SHAKE, (GLUCERNA SHAKE) LIQD, Take 237 mLs by mouth 2 (two) times daily between meals., Disp: 60 Can, Rfl: 0 .  fluconazole  (DIFLUCAN) 150 MG tablet, Take 1 tablet (150 mg total) by mouth daily., Disp: 2 tablet, Rfl: 0 .  fluticasone (FLONASE) 50 MCG/ACT nasal spray, Place 2 sprays into both nostrils daily as needed for rhinitis., Disp: 16 g, Rfl: 5 .  furosemide (LASIX) 40 MG tablet, Take 1 tablet (40 mg total) by mouth daily., Disp: 30 tablet, Rfl: 0 .  glucose blood test strip, USE UP TO THREE TIMES DAILY AS DIRECTED, Disp: 300 each, Rfl: 2 .  glucose blood test strip, Use to check blood sugars 3 times daily. Dx code: E11.21, Disp: 100 each, Rfl: 12 .  guaiFENesin (MUCINEX) 600 MG 12 hr tablet, Take 600 mg by mouth 2 (two) times daily., Disp: , Rfl:  .  HUMALOG MIX 75/25 KWIKPEN (75-25) 100 UNIT/ML Kwikpen, 10 Units with breakfast and 30 units with evening meal, Disp: 15 mL, Rfl: 11 .  hydrALAZINE (APRESOLINE) 25 MG tablet, Take 1 tablet (25 mg total) by mouth 3 (three) times daily., Disp: 270 tablet, Rfl: 3 .  ipratropium-albuterol (DUONEB) 0.5-2.5 (3) MG/3ML SOLN, Take 3 mLs by nebulization every 6 (six) hours as needed (wheezing, shortness of breath)., Disp: 360 mL, Rfl: 5 .  isosorbide mononitrate (IMDUR) 30 MG 24 hr tablet, Take 1 tablet (30 mg total) by mouth daily., Disp: 90 tablet, Rfl: 2 .  nitroGLYCERIN (NITROLINGUAL) 0.4 MG/SPRAY spray, Place 1 spray under the tongue every 5 (five) minutes x 3 doses as needed for chest pain., Disp: 12 g, Rfl: 12 .  ondansetron (ZOFRAN) 4 MG tablet, Take 4 mg by mouth every 8 (eight) hours as needed for nausea or vomiting., Disp: , Rfl:  .  potassium chloride SA (K-DUR) 20 MEQ tablet, Take 1 tablet (20 mEq total) by mouth daily., Disp: 30 tablet, Rfl: 0 .  predniSONE (DELTASONE) 10 MG tablet, Take 10 mg by mouth daily with breakfast., Disp: , Rfl:  .  pregabalin (LYRICA) 100 MG capsule, Take 100 mg by mouth 2 (two) times daily. , Disp: , Rfl:  .  propranolol (INDERAL) 10 MG tablet, TAKE ONE TABLET BY MOUTH THREE TIMES A DAY, Disp: 90 tablet, Rfl: 1 .  rosuvastatin (CRESTOR)  40 MG tablet, Take 1 tablet (40 mg total) by mouth  daily., Disp: 90 tablet, Rfl: 1 .  senna (SENOKOT) 8.6 MG tablet, Take 1 tablet by mouth 2 (two) times daily as needed for constipation., Disp: , Rfl:  .  ticagrelor (BRILINTA) 60 MG TABS tablet, Take 1 tablet (60 mg total) by mouth 2 (two) times daily., Disp: 60 tablet, Rfl: 11 .  traMADol (ULTRAM) 50 MG tablet, Take 1 tablet (50 mg total) by mouth 2 (two) times daily as needed., Disp: 12 tablet, Rfl: 0 No current facility-administered medications for this visit.   Facility-Administered Medications Ordered in Other Visits:  .  albuterol (PROVENTIL) (5 MG/ML) 0.5% nebulizer solution 2.5 mg, 2.5 mg, Nebulization, Once, Derrel Nip, Aris Everts, MD  EXAM:   General impression: alert, cooperative and articulate.  No signs of being in distress  Lungs: speech is fluent ;msentence length suggests that patient is not short of breath and not punctuated by cough, sneezing or sniffing. [name]   Psych: affect normal.  speech is articulate and non pressured .  Denies suicidal thoughts  Nails:  Photo submitted  of several affected nails shows dystrophic changes with vertical ridging and splitting not consistent with onychomycosis   ASSESSMENT AND PLAN:  Discussed the following assessment and plan:  DM (diabetes mellitus), type 2, uncontrolled, with renal complications ([place]) - Plan: Hemoglobin A1c, Comprehensive metabolic panel  Chronic diastolic CHF (congestive heart failure) (HCC)  CKD (chronic kidney disease), stage III (HCC)  Mixed hyperlipidemia  Nail dystrophy  Chronic diastolic CHF (congestive heart failure) (HCC) Diuretic dose has been increased to twice daily.  ECHO repeated this year notes normal EF of 65-70% with increased end diastolic filling pressures   DM (diabetes mellitus), type 2, uncontrolled, with renal complications (HCC) She has been Taking 10 units on mixed insulin in the morning and 30 at night . Readings are much better,  She is due  for A1c. Taking aspirin,  Crestor and ARB  . Lab Results  Component Value Date   HGBA1C 7.7 (H) 02/10/2018      CKD (chronic kidney disease), stage III (HCC) Renal function remained stable during hospitalization and will need monitoring after several weeks of increased diuretic dose  Lab Results  Component Value Date   CREATININE 1.17 (H) 07/31/2018    Mixed hyperlipidemia Managed with crestor. Goal LDL is < 70,  Will increase  Dose  Lab Results  Component Value Date   CHOL 179 07/31/2018   HDL 49 07/31/2018   LDLCALC 92 07/31/2018   LDLDIRECT 102.0 03/14/2016   TRIG 192 (H) 07/31/2018   CHOLHDL 3.7 07/31/2018     Nail dystrophy There Is no indication of onychomycosis based on photograph of fingernails submitted today. Even if fungus is present, treating 2 nails with antifungal would require suspension of Crestor for 3-6 months.  The risks outweigh the benefits     I discussed the assessment and treatment plan with the patient. The patient was provided an opportunity to ask questions and all were answered. The patient agreed with the plan and demonstrated an understanding of the instructions.   The patient was advised to call back or seek an in-person evaluation if the symptoms worsen or if the condition fails to improve as anticipated.  I provided 30 minutes of non-face-to-face time during this encounter.   [name], MD

## 2018-09-10 NOTE — Telephone Encounter (Signed)
Your nails do not appear to have a fungal infection.  The lines and cracks are due to aging.  I recommend starting a Hair Skin and Nails supplement which can be purchased otc at any pharmacy , and protect your nails from harsh detergents by wearing gloves when you clear or use detergents

## 2018-09-12 DIAGNOSIS — L603 Nail dystrophy: Secondary | ICD-10-CM | POA: Insufficient documentation

## 2018-09-12 MED ORDER — HUMALOG MIX 75/25 KWIKPEN (75-25) 100 UNIT/ML ~~LOC~~ SUPN: PEN_INJECTOR | SUBCUTANEOUS | 11 refills | Status: DC

## 2018-09-12 MED ORDER — ROSUVASTATIN CALCIUM 40 MG PO TABS: 40.0000 mg | ORAL_TABLET | Freq: Every day | ORAL | 1 refills | Status: AC

## 2018-09-12 NOTE — Assessment & Plan Note (Signed)
She has been Taking 10 units on mixed insulin in the morning and 30 at night . Readings are much better,  She is due for A1c. Taking aspirin,  Crestor and ARB  . Lab Results  Component Value Date   HGBA1C 7.7 (H) 02/10/2018

## 2018-09-12 NOTE — Assessment & Plan Note (Signed)
There Is no indication of onychomycosis based on photograph of fingernails submitted today. Even if fungus is present, treating 2 nails with antifungal would require suspension of Crestor for 3-6 months.  The risks outweigh the benefits

## 2018-09-12 NOTE — Assessment & Plan Note (Signed)
Renal function remained stable during hospitalization and will need monitoring after several weeks of increased diuretic dose  Lab Results  Component Value Date   CREATININE 1.17 (H) 07/31/2018

## 2018-09-12 NOTE — Assessment & Plan Note (Signed)
Managed with crestor. Goal LDL is < 70,  Will increase  Dose  Lab Results  Component Value Date   CHOL 179 07/31/2018   HDL 49 07/31/2018   LDLCALC 92 07/31/2018   LDLDIRECT 102.0 03/14/2016   TRIG 192 (H) 07/31/2018   CHOLHDL 3.7 07/31/2018

## 2018-09-12 NOTE — Assessment & Plan Note (Signed)
Diuretic dose has been increased to twice daily.  ECHO repeated this year notes normal EF of 65-70% with increased end diastolic filling pressures

## 2018-09-13 ENCOUNTER — Other Ambulatory Visit: Payer: Medicare Other

## 2018-09-13 DIAGNOSIS — I130 Hypertensive heart and chronic kidney disease with heart failure and stage 1 through stage 4 chronic kidney disease, or unspecified chronic kidney disease: Secondary | ICD-10-CM | POA: Diagnosis not present

## 2018-09-13 DIAGNOSIS — J9621 Acute and chronic respiratory failure with hypoxia: Secondary | ICD-10-CM | POA: Diagnosis not present

## 2018-09-13 DIAGNOSIS — N184 Chronic kidney disease, stage 4 (severe): Secondary | ICD-10-CM | POA: Diagnosis not present

## 2018-09-13 DIAGNOSIS — J449 Chronic obstructive pulmonary disease, unspecified: Secondary | ICD-10-CM | POA: Diagnosis not present

## 2018-09-13 DIAGNOSIS — I5032 Chronic diastolic (congestive) heart failure: Secondary | ICD-10-CM | POA: Diagnosis not present

## 2018-09-13 DIAGNOSIS — E1122 Type 2 diabetes mellitus with diabetic chronic kidney disease: Secondary | ICD-10-CM | POA: Diagnosis not present

## 2018-09-14 ENCOUNTER — Other Ambulatory Visit (INDEPENDENT_AMBULATORY_CARE_PROVIDER_SITE_OTHER): Payer: Medicare Other

## 2018-09-14 ENCOUNTER — Other Ambulatory Visit: Payer: Self-pay

## 2018-09-14 ENCOUNTER — Other Ambulatory Visit: Payer: Self-pay | Admitting: *Deleted

## 2018-09-14 DIAGNOSIS — E1165 Type 2 diabetes mellitus with hyperglycemia: Secondary | ICD-10-CM | POA: Diagnosis not present

## 2018-09-14 DIAGNOSIS — IMO0002 Reserved for concepts with insufficient information to code with codable children: Secondary | ICD-10-CM

## 2018-09-14 DIAGNOSIS — E1129 Type 2 diabetes mellitus with other diabetic kidney complication: Secondary | ICD-10-CM | POA: Diagnosis not present

## 2018-09-14 NOTE — Patient Outreach (Signed)
[facility]) Care Management  09/14/2018  Pamela Collier [birth year] [id number]   Telephone follow up call    Patient readmission to [facility] on 3/20-3/22, DX: Pleural effusion , congestive heart failure malnutrition of moderate degree.  Successful outreach call to patient   Patient discussed doing pretty good on today, states she believes that she is getting stronger. Reports going to have lab work done today ordered by [place]  Patient further discussed :  Heart Failure Patient discussed that her weight is coming down, weighing daily, she reviewed her weight for today is 169, she does not have her record near her to discuss previous days.  Patient denies shortness of breath or swelling , when reviewed heart failure symptoms . Patient reports no sudden weight gain of 3 pounds in a day or 5 in a week. Patient discussed not adding salt to food, eating delivered meals, but does eat some meals that family may pick up her discussed hidden salt in fast foods.  Patient continues to use the telemonitoring system  Diabetes Patient continues to report monitoring her blood sugars twice daily and taking insulin as prescribed, 10 units in the morning and 30 units in the evening. Patient denies low blood sugar reading less than 70, reports this am reading 89 and last night 149. Patient discussed hoping lab work today will show how well she has been doing.  Fall risk Patient still followed by home health Physical therapy making good progress, reports walking recently outside almost made it to the mailbox. Patient states that she has been advised to use walker instead of cane, she believes she could have made it to mailbox if she was using walker instead of cane.  Medications  Patient reports that her son and husband are helping her with making sure she is taking medications she reports noticing weights coming down  since lasix was recently changed by MD.  Patient son/husband not available  to speak with to discuss. .   Patient denies any new concerns at this time. She discussed future plans to move to [place] , hopefully in the next 3 months.   [facility] RN and Physical therapy , will follow up with [facility] regarding patient progress.   Plan Will plan return call in the next 3 weeks and consider case closure, if no new concerns with  possible transition to health coach for disease management  Placed care coordination call to [facility] regarding patient progress and tele monitoring scale and blood pressures continued follow up plan. Will await return call and if no response plan follow up call in the next week.   THN CM Care Plan Problem One     Most Recent Value  Care Plan Problem One  High risk for readmission related to recent discharge for HF,pleural effusion   Role Documenting the Problem One  Care Management [place] for Problem One  Active  THN Long Term Goal   Patient will not experience a hospital admission over the next 60 days   THN Long Term Goal Start Date  08/03/18 Sudie Grumbling restart ]  Interventions for Problem One Long Term Goal  Discussed with importance of adhering to measures of managing heart faiilure,,limiiting salt in diet review of rationale , monitoring daily weights and notifying MD of sudden weight gain reviewed.   THN CM Short Term Goal #1   Over the next 30 days patient will be able to report weighing self daily and  keeping a record  THN CM Short Term Goal #1 Start Date  08/03/18  [name] CM Short Term Goal #1 Met Date  08/26/18  THN CM Short Term Goal #2   Patient will be able to identify 2 worsening symptoms of Heart failure over the next 30 days   THN CM Short Term Goal #2 Start Date  08/03/18  [facility] CM Short Term Goal #2 Met Date  09/14/18  THN CM Short Term Goal #3  Over the next 30 days patient will be able to identify at least 3 high salt food to avoid over the next 30 days   THN CM Short Term Goal #3  Start Date  08/03/18  [facility] CM Short Term Goal #3 Met Date  08/26/18      [name], RN, [place] Management Coordinator  [phone]- Mobile [phone]- [place]

## 2018-09-14 NOTE — Telephone Encounter (Signed)
Spoke with pt and she stated that she has been taking Hair Skin and Nails supplement three times daily.

## 2018-09-15 ENCOUNTER — Telehealth (HOSPITAL_COMMUNITY): Payer: Self-pay | Admitting: Licensed Clinical Social Worker

## 2018-09-15 DIAGNOSIS — I5032 Chronic diastolic (congestive) heart failure: Secondary | ICD-10-CM | POA: Diagnosis not present

## 2018-09-15 DIAGNOSIS — E1122 Type 2 diabetes mellitus with diabetic chronic kidney disease: Secondary | ICD-10-CM | POA: Diagnosis not present

## 2018-09-15 DIAGNOSIS — J449 Chronic obstructive pulmonary disease, unspecified: Secondary | ICD-10-CM | POA: Diagnosis not present

## 2018-09-15 DIAGNOSIS — I130 Hypertensive heart and chronic kidney disease with heart failure and stage 1 through stage 4 chronic kidney disease, or unspecified chronic kidney disease: Secondary | ICD-10-CM | POA: Diagnosis not present

## 2018-09-15 DIAGNOSIS — J9621 Acute and chronic respiratory failure with hypoxia: Secondary | ICD-10-CM | POA: Diagnosis not present

## 2018-09-15 DIAGNOSIS — N184 Chronic kidney disease, stage 4 (severe): Secondary | ICD-10-CM | POA: Diagnosis not present

## 2018-09-15 LAB — COMPREHENSIVE METABOLIC PANEL
ALT: 14 U/L (ref 0–35)
AST: 17 U/L (ref 0–37)
Albumin: 3.5 g/dL (ref 3.5–5.2)
Alkaline Phosphatase: 81 U/L (ref 39–117)
BUN: 24 mg/dL — ABNORMAL HIGH (ref 6–23)
CO2: 28 mEq/L (ref 19–32)
Calcium: 9.4 mg/dL (ref 8.4–10.5)
Chloride: 101 mEq/L (ref 96–112)
Creatinine, Ser: 1.33 mg/dL — ABNORMAL HIGH (ref 0.40–1.20)
GFR: 46.64 mL/min — ABNORMAL LOW (ref >60.00)
Glucose, Bld: 156 mg/dL — ABNORMAL HIGH (ref 70–99)
Potassium: 3.9 mEq/L (ref 3.5–5.1)
Sodium: 138 mEq/L (ref 135–145)
Total Bilirubin: 0.4 mg/dL (ref 0.2–1.2)
Total Protein: 6.3 g/dL (ref 6.0–8.3)

## 2018-09-15 LAB — HEMOGLOBIN A1C: Hgb A1c MFr Bld: 7.7 % — ABNORMAL HIGH (ref 4.6–6.5)

## 2018-09-15 NOTE — Telephone Encounter (Signed)
CSW called pt to check how first [place] delivery went.  Pt states that the delivery arrived with no issues and that she has been really enjoying the meals.  Pt expresses no other concerns at this time.  CSW will continue to follow and assist as needed  Jorge Ny, [facility] Desk#: [phone] Cell#: [phone]

## 2018-09-16 ENCOUNTER — Telehealth: Payer: Self-pay | Admitting: Cardiovascular Disease

## 2018-09-16 NOTE — Telephone Encounter (Signed)
2nd attempt unable to lmom.

## 2018-09-16 NOTE — Telephone Encounter (Signed)
Called pt to give JV recommendation. Unable to lmom. Pt phone rings out.

## 2018-09-16 NOTE — Telephone Encounter (Signed)
Agree with recommendation and dietary recommendation of 2g sodium restriction, 2L fluid restriction. Encourage daily weights. If she has a blood pressure cuff at home, BP measurements at the same time each day are also helpful.  Please ensure she is weighing herself daily in the morning at the same time each day. Her dry weight is 162 lbs.   Continue lasix 40mg  daily with potassium supplementation as recommended by primary cardiologist and until her weight returns to baseline dry weight of 162 lbs.  If the weight goes down more than 3 pounds from dry weight: Contact the office. Hold Lasix until it returning to baseline dry weight

## 2018-09-16 NOTE — Telephone Encounter (Signed)
Patient states she thinks she needs some fluid taken off, states she is having aching in her lower back . Please call to discuss.

## 2018-09-16 NOTE — Telephone Encounter (Signed)
Spoke with the pt. Pt sts that she has been experiencing  lower back pain and wonders if her diuretics needs to be increased further. During her hospitalization her back pain was relieved after she was diuresed.  Pt had a telemedicine appt with  Dr. Rockey Situ on 4/27. Her reported weight was 171lbsher lasix was increased to 20mg  bid until her weight is back down to 162lb.  Pt denies sob, orthopnea, pnd, abd/LE swelling. Her weight his morning 170lb. She has been taking tylenol prn and using a cold compress with relief for her lower back pain. She is sedentary and uses a walker to ambulate. She has in home PT weekly.  Adv the pt that it was ok to use cold compress and tylenol as needed for back pain, she should not exceed the daily recommended dosing. Adv her to avoid sodium and not drink fluids in excess. Adv the pt that I will fwd the msg to a provider for any additional recommendations. Pt verbalized understanding.

## 2018-09-17 ENCOUNTER — Telehealth: Payer: Self-pay | Admitting: Internal Medicine

## 2018-09-17 ENCOUNTER — Telehealth: Payer: Self-pay

## 2018-09-17 ENCOUNTER — Other Ambulatory Visit: Payer: Self-pay | Admitting: *Deleted

## 2018-09-17 DIAGNOSIS — I130 Hypertensive heart and chronic kidney disease with heart failure and stage 1 through stage 4 chronic kidney disease, or unspecified chronic kidney disease: Secondary | ICD-10-CM | POA: Diagnosis not present

## 2018-09-17 DIAGNOSIS — J449 Chronic obstructive pulmonary disease, unspecified: Secondary | ICD-10-CM | POA: Diagnosis not present

## 2018-09-17 DIAGNOSIS — I5032 Chronic diastolic (congestive) heart failure: Secondary | ICD-10-CM | POA: Diagnosis not present

## 2018-09-17 DIAGNOSIS — N184 Chronic kidney disease, stage 4 (severe): Secondary | ICD-10-CM | POA: Diagnosis not present

## 2018-09-17 DIAGNOSIS — J9621 Acute and chronic respiratory failure with hypoxia: Secondary | ICD-10-CM | POA: Diagnosis not present

## 2018-09-17 DIAGNOSIS — E1122 Type 2 diabetes mellitus with diabetic chronic kidney disease: Secondary | ICD-10-CM | POA: Diagnosis not present

## 2018-09-17 NOTE — Telephone Encounter (Signed)
Dr. Derrel Nip was filling this in the past but pt was taken off of it. Rx was given 06/2018 in the hospital for her legs. Per pt, nurse that fixes her pills told her she needed to have it refilled. She would like to hold on refill until Dr Derrel Nip returns. She is taking tylenol for her legs right now.

## 2018-09-17 NOTE — Telephone Encounter (Signed)
Pt is requesting a refill on Tramadol to be sent to her [place], [place] - [place] [phone] (Phone) [phone] (Fax) Saxon Barich,cma

## 2018-09-17 NOTE — Telephone Encounter (Unsigned)
Copied from [place] [phone]. Topic: Quick Communication - Rx Refill/Question >> Sep 17, 2018  3:17 PM Collier, Pamela wrote: Medication: traMADol (ULTRAM) 50 MG tablet  Has the patient contacted their pharmacy? yes   Preferred Pharmacy (with phone number or street name): [place], [place] - [place] [phone] (Phone) [phone] (Fax)  Agent: Please be advised that RX refills may take up to 3 business days. We ask that you follow-up with your pharmacy.

## 2018-09-17 NOTE — Patient Outreach (Signed)
[facility]) Care Management  09/17/2018  Pamela Collier February 17, 1941 [id number]   Care Coordination   Outreach call to [facility] home health , regarding collaboration on patient weight and blood pressure monitoring.  Able to leave a message with nurse Otila Kluver , provided contact number for return call.   [phone]  Received return call from Laurine Blazer , RN with [facility], HIPAA confirmed.  Nurse discussed her visit on 5/6 on this week.  Discussed with RN any concerns related to patient medications, she reports patient seems appropriate with pill packaging when observed. She states no red flags for concern related to heart failure no swelling, no increase in shortness of breath, no sudden weight gain and no trigger with daily tele monitoring information of weights, bp . Discussed patient eating very well.  [place] nurse reports anticipated plan is for discharge of home health services in the next week.   Plan Will plan follow up call in the next week to patient .    [name], RN, [place] Management Coordinator  [phone]- Mobile [phone]- Toll Free Main Office

## 2018-09-17 NOTE — Telephone Encounter (Signed)
This looks like it was prescribed by another provider in 06/2018.  Does she need this refill?  Is this something that Dr Derrel Nip refills?  Why is she needing?  If acute issues, needs appt.

## 2018-09-21 DIAGNOSIS — I5032 Chronic diastolic (congestive) heart failure: Secondary | ICD-10-CM | POA: Diagnosis not present

## 2018-09-21 DIAGNOSIS — E1122 Type 2 diabetes mellitus with diabetic chronic kidney disease: Secondary | ICD-10-CM | POA: Diagnosis not present

## 2018-09-21 DIAGNOSIS — E119 Type 2 diabetes mellitus without complications: Secondary | ICD-10-CM | POA: Diagnosis not present

## 2018-09-21 DIAGNOSIS — J9621 Acute and chronic respiratory failure with hypoxia: Secondary | ICD-10-CM | POA: Diagnosis not present

## 2018-09-21 DIAGNOSIS — J449 Chronic obstructive pulmonary disease, unspecified: Secondary | ICD-10-CM | POA: Diagnosis not present

## 2018-09-21 DIAGNOSIS — I130 Hypertensive heart and chronic kidney disease with heart failure and stage 1 through stage 4 chronic kidney disease, or unspecified chronic kidney disease: Secondary | ICD-10-CM | POA: Diagnosis not present

## 2018-09-21 DIAGNOSIS — H43313 Vitreous membranes and strands, bilateral: Secondary | ICD-10-CM | POA: Diagnosis not present

## 2018-09-21 DIAGNOSIS — N184 Chronic kidney disease, stage 4 (severe): Secondary | ICD-10-CM | POA: Diagnosis not present

## 2018-09-21 LAB — HM DIABETES EYE EXAM

## 2018-09-21 NOTE — Telephone Encounter (Signed)
Dialed patient's number 2 times. It rang several times and then stopped. No VM picked up.

## 2018-09-22 ENCOUNTER — Other Ambulatory Visit: Payer: Self-pay | Admitting: Internal Medicine

## 2018-09-22 ENCOUNTER — Other Ambulatory Visit: Payer: Self-pay | Admitting: *Deleted

## 2018-09-22 DIAGNOSIS — N184 Chronic kidney disease, stage 4 (severe): Secondary | ICD-10-CM | POA: Diagnosis not present

## 2018-09-22 DIAGNOSIS — J449 Chronic obstructive pulmonary disease, unspecified: Secondary | ICD-10-CM | POA: Diagnosis not present

## 2018-09-22 DIAGNOSIS — I130 Hypertensive heart and chronic kidney disease with heart failure and stage 1 through stage 4 chronic kidney disease, or unspecified chronic kidney disease: Secondary | ICD-10-CM | POA: Diagnosis not present

## 2018-09-22 DIAGNOSIS — I5032 Chronic diastolic (congestive) heart failure: Secondary | ICD-10-CM | POA: Diagnosis not present

## 2018-09-22 DIAGNOSIS — J9621 Acute and chronic respiratory failure with hypoxia: Secondary | ICD-10-CM | POA: Diagnosis not present

## 2018-09-22 DIAGNOSIS — E1122 Type 2 diabetes mellitus with diabetic chronic kidney disease: Secondary | ICD-10-CM | POA: Diagnosis not present

## 2018-09-22 NOTE — Patient Outreach (Addendum)
[facility]) Care Management  09/22/2018  Pamela Collier [birth year] [id number]   Telephone follow up assessment   Patient with [facility] readmission 1/25-1/28, Dx : Acute pulmonary edema, pleural effusion Acute on chronic respiratory failure with hypoxia. Discharge to [facility] for rehab. Patient readmission to [facility] on 3/20-3/22, DX: Pleural effusion , congestive heart failure malnutrition of moderate degree.  PMHX; includes but not limited to, Chronic Diastolic heart failure , OSA on CPAP, Diabetes, vascular dementia , diabetic neuropathy, CVA   Outreach call to patient, patient reports feeling pretty good on today. She reports having home health RN nurse visit on today and that she was discharged, she also discussed plans for final home health physical therapy on this week.  Patient discussed getting several calls today and she is a little confused about her appointments.  Reviewed telephone visit with Dr. Rockey Situ on Friday 5/15 at 2:20, she also discussed having an appointment with [place] office and being unsure if this is a office visit or telephone. Reviewed with patient visit is with pharmacist and I will plan call to verify that it is a telephone visit also Diabetes  Patient discussed episode she believes on last week stating that her memory is not good,  regarding blood sugar drop, in which her son and husband called EMS to her home she recalls it being around midnight.  She discussed that she was just out of it , she does not recall how low her blood sugar went but they treated her with a liquid gel  Patient discussed she did not each that much for dinner that evening . Patient states that she continues to take 10 units in the am and 30 units in the evening, discussed dialing up dose on insulin pen patient denies having any difficulty.  Patient discussed appetite most of the time is pretty good, and that she is eating 3 meals a day .    Heart failure   Patient  denies shortness of breath , swelling ,chest discomfort. Patient reviewed recent weights, patient reviewed recent weights, 5/13- 170 lbs 150/52, 60 5/12 - 171 5/11- 168   discussed MD plan of getting down to 162 goal , states that it is taking a while. she reports giving reports of weights and blood pressure reading with nurse from cardiology office on today in preparation for her appointment on 5/15.  She reports still working on limiting daily fluid intake , drinking water from 6 oz bottles and drinking about 6 to 8 a day. She discussed mouth being dry frequently, review of measures to help with dry mouth including sugar free hard candy . Patient verifies that she has working scales in home for use as the tele health monitoring system is packaged for return to [facility] during home health visit on today.  Patient reports that her son and husband continue to help her with her daily medications , she reports that she is taking her lasix twice daily once every morning then early afternoon ( noted per medication list lasix listed as 40 daily) . Will discuss with Kristi , EMT Paramedicine that is following patient for review of medications at next visit. Patient will continue to benefit from education and support of self care management of Heart failure .  Patient will benefit from continued education and support of chronic medical condition of Diabetes/ heart failure.   Plan Placed call to [place] office to verify that patient appointment on 5/18 is by telephone, confirmed office appointment is  by telephone, returned call to patient to update.  Placed call to [place] , EMT Paramedicine to discuss patient concerns, review medication  she is planning a home visit on tomorrow 5/14 communicated this to patient  Reinforced with patient Diabetes and low blood sugar care, meal intake  and notifying MD of low blood sugar readings.  Will plan follow up call to patient in the next 3 weeks.    THN CM Care Plan  Problem One     Most Recent Value  Care Plan Problem One  High risk for readmission related to recent discharge for HF,pleural effusion   Role Documenting the Problem One  Care Management [place] for Problem One  Active  THN Long Term Goal   Patient will not experience a hospital admission over the next 75 days  [goal adjusted ]  THN Long Term Goal Start Date  08/03/18 Sudie Grumbling restart ]  Interventions for Problem One Long Term Goal  Reviewed patient current clinical symptoms encouraged regarding continuing to monitor weights verified that she has scales for home use , taking medications as prescribed . Review worsening symptoms of sudden weight gain of 2 pounds in a day , 5 in a week, sob, swelling to help with avoiding hospital admission .   THN CM Short Term Goal #1   Over the next 30 days patient will be able to report weighing self daily and keeping a record  THN CM Short Term Goal #1 Start Date  08/03/18  [facility] CM Short Term Goal #1 Met Date  08/26/18  THN CM Short Term Goal #2   Patient will be able to identify 2 worsening symptoms of Heart failure over the next 30 days   THN CM Short Term Goal #2 Start Date  08/03/18  [facility] CM Short Term Goal #2 Met Date  09/14/18  THN CM Short Term Goal #3  Over the next 30 days patient will be able to identify at least 3 high salt food to avoid over the next 30 days   THN CM Short Term Goal #3 Start Date  08/03/18  [facility] CM Short Term Goal #3 Met Date  08/26/18  [facility] CM Short Term Goal #4  Patient will be able to report limiting fluid intake to less than 2 liters a day   THN CM Short Term Goal #4 Start Date  09/22/18  Interventions for Short Term Goal #4  Reviewed daily fluid limit reviewed rationale why limiting fluid intake is important on managing heart failure. . REviewed  what she normally drinks and the volume.     [facility] CM Care Plan Problem Two     Most Recent Value  Care Plan Problem Two  Diabetes self care management knowledge deficit  related to low blood sugar management    Role Documenting the Problem Two  Care Management Coordinator  Care Plan for Problem Two  Active  Interventions for Problem Two Long Term Goal   Discussed recent episode of low blood sugar and importance of notifying MD of noted increase in episodes for follow up visit.   THN Long Term Goal  Patient will verbalize no low blood sugar episodes over the next 31 days   THN Long Term Goal Start Date  09/22/18  [facility] CM Short Term Goal #1   Patient will be able to identify 2 symptoms of low blood sugar over the next 20 days   THN CM Short Term Goal #1 Start Date  09/22/18  Interventions  for Short Term Goal #2   Reviewed low blood sugar reading and symptoms that may occur sweatiness, shakiness, dizziness, fast heart beat, blurrry visit .   THN CM Short Term Goal #2   Patient will be able to report 2 ways to manage low blood sugar over the next 20 days   THN CM Short Term Goal #2 Start Date  09/22/18  Interventions for Short Term Goal #2  Discussed ways to treat low blood sugar, review of treatment measures of rule of 15 , 1/2 cup oj, regular soda, 4 glucose tablet, recheck after 15 minutes to ensure reading is above 70 , reviewed eating scheduled meal and how snack/meal of protein/carbohydrate/starch helps with balancing blood sugar.       [name], RN, [place] Management Coordinator  [phone]- Mobile [phone]- Toll Free Main Office

## 2018-09-22 NOTE — Telephone Encounter (Signed)
IF TYLENOL IS MANAGING HER LEG PAIN ,  SHE DOES NOT NEED TO TAKE TRAMADOL SINCE IT IS A CONTROLED SUBSTANCE.  But I will refill it if the patient states that she needs it

## 2018-09-22 NOTE — Telephone Encounter (Signed)
Wt down some.  If not significant swelling or other HF symptoms, prefer to continue current diuretic therapy w/ plan for f/u with TG on 5/15.  Agree w/ dietary advice.

## 2018-09-22 NOTE — Telephone Encounter (Signed)
Patient answered phone. She gave me updated readings: 5/13 135/60  61 170 lb 5/12 150/52  62 171 lb 5/11 114/44  63 168 lb 5/10    169 lb 5/9    168 lb 5/8    167 lb.  It appears she has not met her dry weight goal on 162 lb. Since telephone visit 4/27 with Dr Rockey Situ, patient has been taking furosemide 20 mg BID. Denies shortness of breath or chest pain. Only complaint is lower back pain.   Discussed with patient to restrict sodium to 2g and fluid to 2 L.  States she gets Meals on Wheels and they say it has some sodium. She also drinks 8 ox bottle waters and we talked about how many she could have each day. States everytime she drinks she has to urinate.  Patient is already scheduled for appointment with Dr Rockey Situ on Friday, 09/24/18 virtual. Advised patient to keep appt and I will route to provider for any update.

## 2018-09-23 DIAGNOSIS — I5032 Chronic diastolic (congestive) heart failure: Secondary | ICD-10-CM | POA: Diagnosis not present

## 2018-09-23 DIAGNOSIS — E1122 Type 2 diabetes mellitus with diabetic chronic kidney disease: Secondary | ICD-10-CM | POA: Diagnosis not present

## 2018-09-23 DIAGNOSIS — N184 Chronic kidney disease, stage 4 (severe): Secondary | ICD-10-CM | POA: Diagnosis not present

## 2018-09-23 DIAGNOSIS — J9621 Acute and chronic respiratory failure with hypoxia: Secondary | ICD-10-CM | POA: Diagnosis not present

## 2018-09-23 DIAGNOSIS — I130 Hypertensive heart and chronic kidney disease with heart failure and stage 1 through stage 4 chronic kidney disease, or unspecified chronic kidney disease: Secondary | ICD-10-CM | POA: Diagnosis not present

## 2018-09-23 DIAGNOSIS — J449 Chronic obstructive pulmonary disease, unspecified: Secondary | ICD-10-CM | POA: Diagnosis not present

## 2018-09-23 NOTE — Telephone Encounter (Signed)
Refilled: 03/04/2019 by you the last couple refills look like they have been done by a different provider.   Last OV: 09/10/2018 Next OV: not scheduled

## 2018-09-24 ENCOUNTER — Other Ambulatory Visit: Payer: Self-pay

## 2018-09-24 ENCOUNTER — Telehealth (INDEPENDENT_AMBULATORY_CARE_PROVIDER_SITE_OTHER): Payer: Medicare Other | Admitting: Cardiovascular Disease

## 2018-09-24 DIAGNOSIS — E782 Mixed hyperlipidemia: Secondary | ICD-10-CM | POA: Diagnosis not present

## 2018-09-24 DIAGNOSIS — I5032 Chronic diastolic (congestive) heart failure: Secondary | ICD-10-CM | POA: Diagnosis not present

## 2018-09-24 DIAGNOSIS — E1121 Type 2 diabetes mellitus with diabetic nephropathy: Secondary | ICD-10-CM

## 2018-09-24 DIAGNOSIS — N183 Chronic kidney disease, stage 3 unspecified: Secondary | ICD-10-CM

## 2018-09-24 DIAGNOSIS — I25118 Atherosclerotic heart disease of native coronary artery with other forms of angina pectoris: Secondary | ICD-10-CM | POA: Diagnosis not present

## 2018-09-24 DIAGNOSIS — I6523 Occlusion and stenosis of bilateral carotid arteries: Secondary | ICD-10-CM | POA: Diagnosis not present

## 2018-09-24 DIAGNOSIS — I890 Lymphedema, not elsewhere classified: Secondary | ICD-10-CM

## 2018-09-24 DIAGNOSIS — I5033 Acute on chronic diastolic (congestive) heart failure: Secondary | ICD-10-CM

## 2018-09-24 DIAGNOSIS — J449 Chronic obstructive pulmonary disease, unspecified: Secondary | ICD-10-CM

## 2018-09-24 DIAGNOSIS — I10 Essential (primary) hypertension: Secondary | ICD-10-CM | POA: Diagnosis not present

## 2018-09-24 DIAGNOSIS — R002 Palpitations: Secondary | ICD-10-CM

## 2018-09-24 DIAGNOSIS — R000 Tachycardia, unspecified: Secondary | ICD-10-CM

## 2018-09-24 MED ORDER — FUROSEMIDE 20 MG PO TABS: ORAL_TABLET | ORAL | 3 refills | Status: AC

## 2018-09-24 NOTE — Progress Notes (Deleted)
{Choose 1 Note Type (Telehealth Visit or Telephone Visit):[phone]}   I connected with  Pamela Collier on 09/24/18 by a video enabled telemedicine application and verified that I am speaking with the correct person using two identifiers. I discussed the limitations of evaluation and management by telemedicine. The patient expressed understanding and agreed to proceed.   Evaluation Performed:  Follow-up visit  Date:  09/24/2018   ID:  Pamela Collier, DOB 1940/07/17, MRN [id number]  Patient Location:  [place] [address]   Provider location:   [facility] [place] office  PCP:  [name], MD  Cardiologist:  Patsy Baltimore   Chief Complaint:      History of Present Illness:    Pamela Collier is a 78 y.o. female who presents via audio/video conferencing for a telehealth visit today.   The patient does not symptoms concerning for COVID-19 infection (fever, chills, cough, or new SHORTNESS OF BREATH).   Patient has a past medical history of     Prior CV studies:   The following studies were reviewed today:    Past Medical History:  Diagnosis Date  . Acute respiratory failure ([place]) 11/21/2014  . Anxiety   . Arthritis 08/05/2013  . Atherosclerotic peripheral vascular disease with gangrene [facility]) august 2012  . Carotid arterial disease ([place])    a. 11/1694 RICA 78-93, LICA 81-01.  [name] Cerebral infarct ([place]) 08/17/2013  . Chronic diastolic CHF (congestive heart failure) (HCC)    a. EF 50-55% by echo in 04/2015; b. 11/2016 Echo: EF 55-60%, Gr1 DD; c. 05/2018 Echo: EF 60-65%, no rwma.  [name] COPD (chronic obstructive pulmonary disease) ([place])   . Coronary artery disease, occlusive    a. Previous PCI to the LAD, LCx, and RCA in 2010, 2011, 2013, and 2016. All performed in [place]; b. 11/2016 Lexiscan MV: EF 43%, no ischemia->low risk.  . 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 Diabetic diarrhea ([place]) 10/03/2014  . DM type 2, uncontrolled, with renal  complications ([place]) 11/14/1023  . Hepatic steatosis    by CT abd pelvis  . History of Ischemic Cardiomyopathy    a. EF 35 to 40% by echo in 2013 but subsequently normalized.  [name] History of kidney stones    at a younger age  . Hyperlipidemia LDL goal <100 02/23/2014  . Hypertensive heart disease   . Osteoporosis, post-menopausal   . 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 Pill esophagitis   . Pleural effusion 10/25/2012   10/2012 CT chest >> small to moderate R lung effusion>> chylothorax, 100% lymphs 10/2013 thoracentesis> cytology negative, WBC 1471, > 90% "small lymphs" 01/2014 CT chest> near complete resolution of pleural effusion, stable lymphadenopathy 08/2014 CT chest [place] ([facility]): small right sided effusion decreased in size, stable mediastinal lymphadenopathy 1.0cm largest, 60m . 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 Recurrent pleural effusion on right    a. 05/2018 s/p thoracentesis -->7590m  . Sleep apnea   . [facility]   Past Surgical History:  Procedure Laterality Date  . ABDOMINAL HYSTERECTOMY  at ge 48. secondary to bleeding/partial  . ANTERIOR CERVICAL DECOMP/DISCECTOMY FUSION N/A 10/10/2016   Procedure: Anterior Cervical Discectomy Fusion - Cervical four4- five - Cervical five-Cervical six - Cervical six-Cervical seven;  Surgeon: Earnie Larsson, MD;  Location: [place];  Service: Neurosurgery;  Laterality: N/A;  . BREAST CYST ASPIRATION Right   . CARPAL TUNNEL RELEASE Bilateral   . CHOLECYSTECTOMY     in [place]   . COLONOSCOPY WITH PROPOFOL N/A 01/09/2015   Procedure: COLONOSCOPY WITH PROPOFOL;  Surgeon:  Lucilla Lame, MD;  Location: [facility] ENDOSCOPY;  Service: Endoscopy;  Laterality: N/A;  . CORONARY ANGIOPLASTY WITH STENT PLACEMENT     [place]; [facility]  . EYE SURGERY     bil cataracts  . HERNIA REPAIR     umbilical/ Dr Pat Patrick  . OOPHORECTOMY    . PTCA  August 2012   Right Posterior tibial artery , [place]  . TOE AMPUTATION  Sept 2012   Right 2nd toe, [place]     No outpatient medications have been marked as taking for the 09/24/18 encounter (Appointment) with Minna Merritts, MD.     Allergies:   Cephalexin; Contrast media [iodinated diagnostic agents]; Doxycycline; Gabapentin; and Sulfa antibiotics   Social History   Tobacco Use  . Smoking status: Never Smoker  . Smokeless tobacco: Never Used  Substance Use Topics  . Alcohol use: No    Alcohol/week: 0.0 standard drinks  . Drug use: No     Current Outpatient Medications on File Prior to Visit  Medication Sig Dispense Refill  . acetaminophen (TYLENOL) 500 MG tablet Take 1,000 mg by mouth every 6 (six) hours as needed for mild pain or headache.     . allopurinol (ZYLOPRIM) 100 MG tablet TAKE 1 TABLET BY MOUTH DAILY 30 tablet 0  . ALPRAZolam (XANAX) 0.25 MG tablet Take 1 tablet (0.25 mg total) by mouth 2 (two) times daily as needed for anxiety. 60 tablet 5  . amLODipine (NORVASC) 10 MG tablet TAKE 1 TABLET BY MOUTH DAILY 30 tablet 0  . aspirin EC 81 MG tablet Take 81 mg by mouth daily.    . B-D UF III MINI PEN NEEDLES 31G X 5 MM MISC USE THREE TIMES A DAY 270 each 4  . blood glucose meter kit and supplies Dispense based on patient and insurance preference. Use up to three times daily as directed. (FOR ICD-10 E11.21) 1 each 0  . dicyclomine (BENTYL) 10 MG capsule TAKE 1 CAPSULE BY MOUTH 4 TIMES A DAY 120 capsule 2  . diphenoxylate-atropine (LOMOTIL) 2.5-0.025 MG tablet Take 1 tablet by mouth 4 (four) times daily as needed for diarrhea or loose stools. 30 tablet 0  . donepezil (ARICEPT) 10 MG tablet Take 10 mg  by mouth at bedtime.    . feeding supplement, GLUCERNA SHAKE, (GLUCERNA SHAKE) LIQD Take 237 mLs by mouth 2 (two) times daily between meals. 60 Can 0  . fluconazole (DIFLUCAN) 150 MG tablet Take 1 tablet (150 mg total) by mouth daily. 2 tablet 0  . fluticasone (FLONASE) 50 MCG/ACT nasal spray Place 2 sprays into both nostrils daily as needed for rhinitis. 16 g 5  . furosemide (LASIX) 40 MG tablet Take 1 tablet (40 mg total) by mouth daily. 30 tablet 0  . glucose blood test strip USE UP TO THREE TIMES DAILY AS DIRECTED 300 each 2  . glucose blood test strip Use to check blood sugars 3 times daily. Dx code: E11.21 100  each 12  . guaiFENesin (MUCINEX) 600 MG 12 hr tablet Take 600 mg by mouth 2 (two) times daily.    [name] HUMALOG MIX 75/25 KWIKPEN (75-25) 100 UNIT/ML Kwikpen 10 Units with breakfast and 30 units with evening meal 15 mL 11  . hydrALAZINE (APRESOLINE) 25 MG tablet Take 1 tablet (25 mg total) by mouth 3 (three) times daily. 270 tablet 3  . ipratropium-albuterol (DUONEB) 0.5-2.5 (3) MG/3ML SOLN Take 3 mLs by nebulization every 6 (six) hours as needed (wheezing, shortness of breath). 360 mL 5  . isosorbide mononitrate (IMDUR) 30 MG 24 hr tablet Take 1 tablet (30 mg total) by mouth daily. 90 tablet 2  . nitroGLYCERIN (NITROLINGUAL) 0.4 MG/SPRAY spray Place 1 spray under the tongue every 5 (five) minutes x 3 doses as needed for chest pain. 12 g 12  . ondansetron (ZOFRAN) 4 MG tablet Take 4 mg by mouth every 8 (eight) hours as needed for nausea or vomiting.    . potassium chloride SA (K-DUR) 20 MEQ tablet Take 1 tablet (20 mEq total) by mouth daily. 30 tablet 0  . predniSONE (DELTASONE) 10 MG tablet Take 10 mg by mouth daily with breakfast.    . pregabalin (LYRICA) 100 MG capsule Take 100 mg by mouth 2 (two) times daily.     . propranolol (INDERAL) 10 MG tablet TAKE ONE TABLET BY MOUTH THREE TIMES A DAY 90 tablet 1  . rosuvastatin (CRESTOR) 40 MG tablet Take 1 tablet (40 mg total) by mouth daily.  90 tablet 1  . senna (SENOKOT) 8.6 MG tablet Take 1 tablet by mouth 2 (two) times daily as needed for constipation.    . ticagrelor (BRILINTA) 60 MG TABS tablet Take 1 tablet (60 mg total) by mouth 2 (two) times daily. 60 tablet 11  . traMADol (ULTRAM) 50 MG tablet Take 1 tablet (50 mg total) by mouth 2 (two) times daily as needed. 12 tablet 0   Current Facility-Administered Medications on File Prior to Visit  Medication Dose Route Frequency Provider Last Rate Last Dose  . albuterol (PROVENTIL) (5 MG/ML) 0.5% nebulizer solution 2.5 mg  2.5 mg Nebulization Once [name], MD         Family Hx: The patient's family history includes Asthma in her grandchild; Breast cancer in her maternal aunt; Heart attack in her brother, father, and sister; Heart disease in her brother, father, and sister; Tremor in her mother.  ROS:   Please see the history of present illness.    ROS    Labs/Other Tests and Data Reviewed:    Recent Labs: 04/25/2018: TSH 0.758 06/06/2018: Magnesium 2.0 07/30/2018: B Natriuretic Peptide 391.0 07/31/2018: Hemoglobin 10.8; Platelets 314 09/14/2018: ALT 14; BUN 24; Creatinine, Ser 1.33; Potassium 3.9; Sodium 138   Recent Lipid Panel Lab Results  Component Value Date/Time   CHOL 179 07/31/2018 04:58 AM   CHOL 134 03/19/2012 12:24 AM   TRIG 192 (H) 07/31/2018 04:58 AM   TRIG 110 03/19/2012 12:24 AM   HDL 49 07/31/2018 04:58 AM   HDL 41 03/19/2012 12:24 AM   CHOLHDL 3.7 07/31/2018 04:58 AM   LDLCALC 92 07/31/2018 04:58 AM   LDLCALC 71 03/19/2012 12:24 AM   LDLDIRECT 102.0 03/14/2016 02:52 PM    Wt Readings from Last 3 Encounters:  09/10/18 167 lb (75.8 kg)  08/23/18 168 lb (76.2 kg)  08/01/18 163 lb 6.4 oz (74.1 kg)     Exam:    Vital Signs: Vital signs may also be detailed  in the HPI There were no vitals taken for this visit.  Wt Readings from Last 3 Encounters:  09/10/18 167 lb (75.8 kg)  08/23/18 168 lb (76.2 kg)  08/01/18 163 lb 6.4 oz (74.1 kg)    Temp Readings from Last 3 Encounters:  08/01/18 98.1 F (36.7 C) (Oral)  07/12/18 98.1 F (36.7 C) (Oral)  06/30/18 98.8 F (37.1 C) (Oral)   BP Readings from Last 3 Encounters:  08/01/18 (!) 144/56  07/12/18 134/64  07/05/18 138/62   Pulse Readings from Last 3 Encounters:  08/01/18 62  07/12/18 (!) 59  07/05/18 66     Well nourished, well developed female in no acute distress. Constitutional:  oriented to person, place, and time. No distress.  Head: Normocephalic and atraumatic.  Eyes:  no discharge. No scleral icterus.  Neck: Normal range of motion. Neck supple.  Pulmonary/Chest: No audible wheezing, no distress, appears comfortable Musculoskeletal: Normal range of motion.  no  tenderness or deformity.  Neurological:   Coordination normal. Full exam not performed Skin:  No rash Psychiatric:  normal mood and affect. behavior is normal. Thought content normal.    ASSESSMENT & PLAN:    No diagnosis found.   COVID-19 Education: The signs and symptoms of COVID-19 were discussed with the patient and how to seek care for testing (follow up with PCP or arrange E-visit).  The importance of social distancing was discussed today.  Patient Risk:   After full review of this patients clinical status, I feel that they are at least moderate risk at this time.  Time:   Today, I have spent 25 minutes with the patient with telehealth technology discussing the cardiac and medical problems/diagnoses detailed above   10 min spent reviewing the chart prior to patient visit today   Medication Adjustments/Labs and Tests Ordered: Current medicines are reviewed at length with the patient today.  Concerns regarding medicines are outlined above.   Tests Ordered: No tests ordered   Medication Changes: No changes made   Disposition: Follow-up in 6 months   Signed, Ida Rogue, MD  09/24/2018 7:53 AM    [place] Office [address]  [place] [id number], [place], [place] [address]

## 2018-09-24 NOTE — Progress Notes (Signed)
Virtual Visit via Telephone Note   This visit type was conducted due to national recommendations for restrictions regarding the COVID-19 Pandemic (e.g. social distancing) in an effort to limit this patient's exposure and mitigate transmission in our community.  Due to her co-morbid illnesses, this patient is at least at moderate risk for complications without adequate follow up.  This format is felt to be most appropriate for this patient at this time.  The patient did not have access to video technology/had technical difficulties with video requiring transitioning to audio format only (telephone).  All issues noted in this document were discussed and addressed.  No physical exam could be performed with this format.  Please refer to the patient's chart for her  consent to telehealth for [facility].    Date:  09/24/2018   ID:  Pamela Collier, DOB May 30, 1940, MRN [id number]  Patient Location:  [place] [address]   Provider location:   [facility], [place] office  PCP:  [name], MD  Cardiologist:  Ida Rogue, MD   Chief Complaint: Shortness of breath, palpitations   History of Present Illness:    Pamela Collier is a 78 y.o. female who presents via audio/video conferencing for a telehealth visit today.   The patient does not symptoms concerning for COVID-19 infection (fever, chills, cough, or new SHORTNESS OF BREATH).  Please refer to prior office visit for complete details:  Patient has a past medical history of Chronic diastolic CHF Chronic diarrhea, episodic diabetes type 2, coronary artery disease,  stent to the LAD, circumflex and RCA,  cardiac catheterizations 2010,2011, 2013, 2016 most recently in [place] with stent to the RCA at that time,  pulmonary sarcoidosis  with moderate airflow restriction, mild restrictive disease on PFTs in 2014, chronic wheezing requiring albuterol nebulizer, pleural effusion presenting 01/11/2015 on CT  scan on the right that appear to resolve on follow-up x-rays the end of September 2016,  who presents for routine follow-up of her shortness of breath, chest pain  Hospitalization March 2020 for acute on chronic diastolic CHF We increased her Lasix to every day Previously with abdominal swelling Ideal weight is 162 pounds  170s before the hospital admission  On several telephone visit she continues to run around 170 pounds Lasix increased from 20 daily up to 20 twice daily but weight still has not changed  Continues to report having palpitations at night when lays down, SOB "Pitter patter in the back" Feels somewhat better sitting up Chronic high fluid intake  Today, weight 170 still on Lasix 20 twice daily abdomen Does not feel tight Walking "a little better" Previous fall, has a brace on right foot. Had severe pain, in foot Past 3 weeks with severe bilateral SI joint pain  Medications reviewed On Brilinta 60 twice daily, Crestor 20 Propranolol 10 3 times daily Potassium 10 daily Hydralazine 25 3 times daily  Amlodipine 10 daily  Recent records reviewed  hospitalization March 2020 for shortness of breath, acute on chronic diastolic CHF with pleural effusion Thoracentesis on arrival to the hospital for pleural effusion Was only taking Lasix 3 days a week not Lasix daily as directed Reported having high fluid intake has mouth gets dry Pills in pill pack.     Prior CV studies:   The following studies were reviewed today:  Echocardiogram June 06, 2018 Left ventricle: The cavity size was normal. There was moderate   concentric hypertrophy. Systolic function was normal. The  estimated ejection fraction was in the range of 60% to 65%. Wall   motion was normal; there were no regional wall motion   abnormalities. There was a reduced contribution of atrial   contraction to ventricular filling, due to increased ventricular   diastolic pressure. - Mitral valve: Calcified  annulus. - Atrial septum: No defect or patent foramen ovale was identified. - Pericardium, extracardiac: A trivial pericardial effusion was   identified.  Past Medical History:  Diagnosis Date   Acute respiratory failure ([place]) 11/21/2014   Anxiety    Arthritis 08/05/2013   Atherosclerotic peripheral vascular disease with gangrene [facility]) august 2012   Carotid arterial disease ([place])    a. 10/9627 RICA 52-84, LICA 13-24.   Cerebral infarct ([place]) 08/17/2013   Chronic diastolic CHF (congestive heart failure) ([place])    a. EF 50-55% by echo in 04/2015; b. 11/2016 Echo: EF 55-60%, Gr1 DD; c. 05/2018 Echo: EF 60-65%, no rwma.   COPD (chronic obstructive pulmonary disease) (HCC)    Coronary artery disease, occlusive    a. Previous PCI to the LAD, LCx, and RCA in 2010, 2011, 2013, and 2016. All performed in [place]; b. 11/2016 Lexiscan MV: EF 43%, no ischemia->low risk.   Depression with anxiety 04/03/2012   Diabetic diarrhea ([place]) 10/03/2014   DM type 2, uncontrolled, with renal complications ([place]) 4/0/1027   Hepatic steatosis    by CT abd pelvis   History of Ischemic Cardiomyopathy    a. EF 35 to 40% by echo in 2013 but subsequently normalized.   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   Pill esophagitis    Pleural effusion 10/25/2012   10/2012 CT chest >> small to moderate R lung effusion>> chylothorax, 100% lymphs 10/2013 thoracentesis> cytology negative, WBC 1471, > 90% "small lymphs" 01/2014 CT chest> near complete resolution of pleural effusion, stable lymphadenopathy 08/2014 CT chest [place] ([facility]): small right sided effusion decreased in size, stable mediastinal lymphadenopathy 1.0cm largest, 57m   Pulmonary sarcoidosis  ([place]) 12/07/2012   Diagnosed over 20 years ago in [place] with a mediastinal biopsy 03/2013 Full PFT [facility] > UNACCEPTABLE AND NOT REPRODUCIBLE DATA> Ratio 71% FEV 1 1.02 L (55% pred), FVC 1.31 L (49% pred) could not do lung volumes or DLCO    Recurrent pleural effusion on right    a. 05/2018 s/p thoracentesis -->727ml.   Sleep apnea    Stage III chronic kidney disease ([place])    Past Surgical History:  Procedure Laterality Date   ABDOMINAL HYSTERECTOMY     at ge 39. secondary to bleeding/partial   ANTERIOR CERVICAL DECOMP/DISCECTOMY FUSION N/A 10/10/2016   Procedure: Anterior Cervical Discectomy Fusion - Cervical four4- five - Cervical five-Cervical six - Cervical six-Cervical seven;  Surgeon: Earnie Larsson, MD;  Location: [place];  Service: Neurosurgery;  Laterality: N/A;   BREAST CYST ASPIRATION Right    CARPAL TUNNEL RELEASE Bilateral    CHOLECYSTECTOMY     in [place]    COLONOSCOPY WITH PROPOFOL N/A 01/09/2015   Procedure: COLONOSCOPY WITH PROPOFOL;  Surgeon: Lucilla Lame, MD;  Location: [facility] ENDOSCOPY;  Service: Endoscopy;  Laterality: N/A;   CORONARY ANGIOPLASTY WITH STENT PLACEMENT     [place]; [facility]   EYE SURGERY     bil cataracts   HERNIA REPAIR     umbilical/ Dr Pat Patrick   OOPHORECTOMY     PTCA  August 2012   Right Posterior tibial artery , [place]   TOE AMPUTATION  Sept 2012   Right 2nd toe, [place]     No outpatient medications have been marked as taking for the 09/24/18 encounter (Telemedicine) with Minna Merritts, MD.     Allergies:   Cephalexin; Contrast media [iodinated diagnostic agents]; Doxycycline; Gabapentin; and Sulfa antibiotics   Social History   Tobacco Use   Smoking status: Never Smoker   Smokeless tobacco: Never Used  Substance Use Topics   Alcohol use: No    Alcohol/week: 0.0 standard drinks   Drug use: No     Family Hx: The patient's family history includes Asthma in her grandchild; Breast cancer in her  maternal aunt; Heart attack in her brother, father, and sister; Heart disease in her brother, father, and sister; Tremor in her mother.  ROS:   Please see the history of present illness.    Review of Systems  Constitutional: Negative.   Respiratory: Negative.   Cardiovascular: Positive for palpitations.  Gastrointestinal: Negative.        Abdominal swelling  Neurological: Negative.   Psychiatric/Behavioral: Negative.   All other systems reviewed and are negative.    Labs/Other Tests and Data Reviewed:    Recent Labs: 04/25/2018: TSH 0.758 06/06/2018: Magnesium 2.0 07/30/2018: B Natriuretic Peptide 391.0 07/31/2018: Hemoglobin 10.8; Platelets 314 09/14/2018: ALT 14; BUN 24; Creatinine, Ser 1.33; Potassium 3.9; Sodium 138   Recent Lipid Panel Lab Results  Component Value Date/Time   CHOL 179 07/31/2018 04:58 AM   CHOL 134 03/19/2012 12:24 AM   TRIG 192 (H) 07/31/2018 04:58 AM   TRIG 110 03/19/2012 12:24 AM   HDL 49 07/31/2018 04:58 AM   HDL 41 03/19/2012 12:24 AM   CHOLHDL 3.7 07/31/2018 04:58 AM   LDLCALC 92 07/31/2018 04:58 AM   LDLCALC 71 03/19/2012 12:24 AM   LDLDIRECT 102.0 03/14/2016 02:52 PM    Wt Readings from Last 3 Encounters:  09/10/18 167 lb (75.8 kg)  08/23/18 168 lb (76.2 kg)  08/01/18 163 lb 6.4 oz (74.1 kg)     Exam:    Vital Signs:   130s over 60, pulses 60s respirations 16  Well nourished, well developed female in no acute distress. Breathing well, no coughing, wheezing  ASSESSMENT & PLAN:    Chronic diastolic CHF (congestive heart failure) ([place]) --Long discussion concerning CHF management, dietary changes, fluid moderation Weight continues to run around 170 pounds but she feels relatively well Recommend she continue Lasix 20 twice daily but hold all diuretics on Sunday given recent lab work showing mildly prerenal state She is moderating her fluid intake  Coronary artery disease involving native coronary artery of native heart without angina  pectoris No chest pain concerning for angina On aspirin with Brilinta  DM (diabetes mellitus), type 2, uncontrolled, with renal complications ([place]) Recently had hypoglycemic event, 911 was called, given glucose Reports that she is trying to do better with her diet and changed her diet drastically to be more healthy Suggested she discuss this with Dr. Derrel Nip  CKD (chronic kidney disease), stage III ([place]) Rate around 170 pounds, renal function suggesting mild prerenal state We will skip the diuretics on Sundays  Mixed hyperlipidemia Recommend she stay on her Crestor  Back pain By her account sounds like bilateral SI  joint pain going on for the past 3 weeks, significant discomfort  Palpitations Unclear if she is having arrhythmia, on multiple visits has reported palpitations in her back when she lays down at night Unable to exclude arrhythmia, we have ordered a ZIO monitor for further evaluation   COVID-19 Education: The signs and symptoms of COVID-19 were discussed with the patient and how to seek care for testing (follow up with PCP or arrange E-visit).  The importance of social distancing was discussed today.  Patient Risk:   After full review of this patients clinical status, I feel that they are at least moderate risk at this time.  Time:   Today, I have spent 25 minutes with the patient with telehealth technology discussing CHF management, salt and fluid management, increase to Lasix pill, monitoring daily weights.     Medication Adjustments/Labs and Tests Ordered: Current medicines are reviewed at length with the patient today.  Concerns regarding medicines are outlined above.   Tests Ordered: No tests ordered   Medication Changes: No changes made   Disposition: Follow-up in 3 months   Signed, Ida Rogue, MD  09/24/2018 7:54 AM    [place] Office [address] [place] [address], [place], [place] [address]

## 2018-09-24 NOTE — Telephone Encounter (Signed)
Pt aware and tylenol is working ok. She will let me know if she needs anything more.

## 2018-09-24 NOTE — Patient Instructions (Addendum)
Medication Instructions:  Your physician has recommended you make the following change in your medication:   1) Skip the lasix (furosemide) on Sunday  2) Stay on lasix 20 mg- 1 tablet twice daily  If you need a refill on your cardiac medications before your next appointment, please call your pharmacy.    Lab work: No new labs needed   If you have labs (blood work) drawn today and your tests are completely normal, you will receive your results only by: [name] MyChart Message (if you have MyChart) OR . A paper copy in the mail If you have any lab test that is abnormal or we need to change your treatment, we will call you to review the results.   Testing/Procedures: -Your physician has recommended that you wear a 14 day heart monitor (ZIO patch) - This will be mailed directly to your home address - You may receive a call directly from the company for [place], which is [employer] within the next few day- if you see an 800# or 224# calling, please answer - Once the monitor is applied and activated it will record your heart rate/ rhythm for the duration that you are wearing it - If you are having any symptoms (dizziness/ lightheadedness/ palpitations/ racing heart/ anything that just doesn't feel right) then you will push the button in the center of the monitor to mark you were having a symptom - Please DO NOT shower for 24 hours after the monitor is placed - No tub baths/ swimming pools/ hot tubs while wearing the monitor - Do not put any lotions, oils, or ointments around the monitor - If you have any issues with the monitor itself, then please call the 800 # for the company - After 14 days please take the monitor off at home and mail it back ([place] mail) in the box provided by [employer], postage is already paid.  Follow-Up: At [facility], you and your health needs are our priority.  As part of our continuing mission to provide you with exceptional heart care, we have created designated Provider Care  Teams.  These Care Teams include your primary Cardiologist (physician) and Advanced Practice Providers (APPs -  Physician Assistants and Nurse Practitioners) who all work together to provide you with the care you need, when you need it.  . You will need a follow up appointment in 3 months .   Please call our office 2 months in advance to schedule this appointment.    . Providers on your designated Care Team:   . Murray Hodgkins, NP . Christell Faith, PA-C . Marrianne Mood, PA-C  Any Other Special Instructions Will Be Listed Below (If Applicable).  For educational health videos Log in to : [email or web address] Or : [email or web address], password : [email or web address]

## 2018-09-27 ENCOUNTER — Telehealth: Payer: Self-pay | Admitting: Pharmacist

## 2018-09-27 ENCOUNTER — Ambulatory Visit: Payer: Medicare Other | Admitting: Pharmacist

## 2018-09-27 MED ORDER — HUMALOG MIX 75/25 KWIKPEN (75-25) 100 UNIT/ML ~~LOC~~ SUPN: PEN_INJECTOR | SUBCUTANEOUS | 11 refills | Status: DC

## 2018-09-27 NOTE — Telephone Encounter (Addendum)
S:     Chief Complaint  Patient presents with  . Medication Refill    Diabetes     Patient contacted telephonically for diabetes evaluation, education, and management at the request of Dr. Derrel Nip. Last seen by primary care provider on 09/10/2018.   Pamela Collier reports she is doing well. She reports one hypoglycemic episode of 60 something and was "completely out of it" and could not think clearly. EMS was called and they administered glucose gel. She denies other hypoglycemic events but does report she has juice on hand if she ever has another event to raise her blood glucose quickly.   Pamela Collier was recently in rehab and has a lot of insulin at home. She may qualify for patient assistance to cover a long acting insulin to lessen her risk of hypoglycemia   She reports she checks her feet daily. She also regularly sees her eye doctor and had an appointment today and was prescribed an eye drop for allergies.   Insurance coverage/medication affordability: Medicare, BCBS   Patient reports adherence with medications.  Current diabetes medications include: Humalog 75:25 mix 10 units in the morning, 30 units in the evening  Current hypertension medications include: amlodipine,  Hydralazine, Imdur Current hyperlipidemic medications include: rosuvastatin   Patient reports one episode of hypoglycemic and could not think clearly/ was completely out of it. Patient denies hyperglycemic symptoms including polyuria, polydipsia, polyphagia, nocturia, neuropathy, blurred vision. Patient reports self foot exams.   Patient reported dietary habits: Eats 3 meals/day. Receives free heart healthy diets via [employer] coordinated by her cardiology office.  Breakfast:oatmeal with pinapples, egg or french toast Low sodium, prepackaged meals  Lunch:vegatables, meat Dinner:vegatables, meat  Snacks:apple sauce, peaches, pineapple, jello   Drinks: juices, water, diet soda   Patient reported exercise habits: home  health therapy on hold due to [place]. Walks outside, has a bike inside that she rides for exercise.    O:   Lab Results  Component Value Date   HGBA1C 7.7 (H) 52/77/8242    Basic Metabolic Panel BMP Latest Ref Rng & Units 09/14/2018 07/31/2018 07/30/2018  Glucose 70 - 99 mg/dL 156(H) 226(H) 67(L)  BUN 6 - 23 mg/dL 24(H) 20 21  Creatinine 0.40 - 1.20 mg/dL 1.33(H) 1.17(H) 1.27(H)  BUN/Creat Ratio 12 - 28 - - -  Sodium 135 - 145 mEq/L 138 140 143  Potassium 3.5 - 5.1 mEq/L 3.9 4.2 3.1(L)  Chloride 96 - 112 mEq/L 101 107 108  CO2 19 - 32 mEq/L 28 26 28   Calcium 8.4 - 10.5 mg/dL 9.4 8.8(L) 8.9   Lipid Panel     Component Value Date/Time   CHOL 179 07/31/2018 0458   CHOL 134 03/19/2012 0024   TRIG 192 (H) 07/31/2018 0458   TRIG 110 03/19/2012 0024   HDL 49 07/31/2018 0458   HDL 41 03/19/2012 0024   CHOLHDL 3.7 07/31/2018 0458   VLDL 38 07/31/2018 0458   VLDL 22 03/19/2012 0024   LDLCALC 92 07/31/2018 0458   LDLCALC 71 03/19/2012 0024   LDLDIRECT 102.0 03/14/2016 1452    Self-Monitored Blood Glucose Results Fasting SMBG: 89, 100 2 hour post-prandial/random SMBG: 100s, rarely 200s   A/P: #Diabetes - Currently uncontrolled, most recent A1c 7.7% on 09/14/2018. Goal A1c <7%. Patient reports one hypoglycemic event. Patient reports adherence with medication. Control is suboptimal due to need for medication optimization, continued hyperglycemia with rare hypoglycemia. - Decrease Humalog mix to 10 units with breakfast and 20 units with dinner -  Patient qualifies for patient assistance with [employer] for [place]. We will mail patient portion of application to the patient with a return envelope.. Will fill out provider portion and have Dr. Derrel Nip sign to send to company. Writing for Tresiba 20 units once daily (Current basal dose is 75% of daily 40 units = 30 units, with reduction to prevent hypoglycemia) - Consider SGLT2 moving forward d/t CHF. - Her free meals have juice and fruit.  Will reach out to Tammy Sours, LCSW with cardiology to see if there are diet/sugar free options for drinks. - Next A1C anticipated August 2020.    Total time in telephonic counseling 30 minutes.    Follow up in Pharmacist Clinic Visit 2 weeks.   Patient seen with Isaias Sakai, PharmD, PGY1.  Catie Darnelle Maffucci, PharmD, [place] PGY2 Ambulatory Care Pharmacy Resident Phone: [phone]

## 2018-09-28 ENCOUNTER — Encounter (HOSPITAL_COMMUNITY): Payer: Self-pay

## 2018-09-28 ENCOUNTER — Other Ambulatory Visit: Payer: Self-pay | Admitting: Pharmacy Technician

## 2018-09-28 ENCOUNTER — Other Ambulatory Visit: Payer: Self-pay

## 2018-09-28 ENCOUNTER — Other Ambulatory Visit (HOSPITAL_COMMUNITY): Payer: Self-pay

## 2018-09-28 NOTE — Patient Outreach (Signed)
[facility]) Care Management  09/28/2018  CLETUS MEHLHOFF Jan 11, 1941 [id number]                           Medication Assistance Referral  Referral From: [facility] RPh Catie T.  Medication/Company: Georgina Quint / [employer] Patient application portion:  Mailed Provider application portion:  N/A Catie had signed while in clinic to Dr. Derrel Nip    Follow up:  Will follow up with patient in 5-10 business days to confirm application(s) have been received.  Hagan Vanauken P. Beryl Hornberger, [place] Management [phone]

## 2018-09-28 NOTE — Telephone Encounter (Signed)
  I have reviewed the above information and agree with above.    Schupp, MD 

## 2018-09-28 NOTE — Progress Notes (Signed)
Today had a visit with Terrence Dupont.  She states doing well, she states her sugars are running good now.  She states been feeling well.  Swelling is down in extremities.  Verified her medications to her bubble packs. She is still taking furosemide twice daily due to her weight is not back down to her dry weight yet. Discussed with her husband of how to check her sugars with glucometer and he can buy over the counter instant glucose if her sugars ever drops and not acting right.  She is eating the Moms meals and enjoying them.  She is drinking water during the day, watches how much she drinks.  Her husband discussed she has a sore on her bottom, suggested that he gets a ring or cushion for her to sit on.  He states it is healing but slowly.  She does not get up and move around a lot, she sits a lot.  She was having some tingling in her hands when laid down at night, last night she propped her self up on pillows and felt better.  She denies chest pain, headaches, dizziness or shortness of breath.  Advised him precautions with coronavirus at this time, she stays home while he goes to the store.  Advised them both to call me if there is anything I can do for them, they have my contact information.  Will continue to visit for heart failure, medication compliance and diet.   [place] [phone]

## 2018-09-29 ENCOUNTER — Other Ambulatory Visit: Payer: Self-pay | Admitting: *Deleted

## 2018-09-29 ENCOUNTER — Telehealth: Payer: Self-pay | Admitting: Cardiovascular Disease

## 2018-09-29 ENCOUNTER — Telehealth (HOSPITAL_COMMUNITY): Payer: Self-pay | Admitting: Licensed Clinical Social Worker

## 2018-09-29 NOTE — Telephone Encounter (Signed)
Patient calling  Patient had a virtual visit with Dr Rockey Situ on 5/15 Still has not received heart monitor, checking on status Please advise

## 2018-09-29 NOTE — Telephone Encounter (Signed)
CSW contacted regarding patients moms meals and concerns about high sugar sides being sent to patient though she is diabetic.  CSW contacted [employer] customer service and request that no more high sugar items be sent to patient- they have placed note on her account to adjust menu accordingly.  CSW will continue to follow and assist as needed  Jorge Ny, [facility] Desk#: [phone] Cell#: [phone]

## 2018-09-29 NOTE — Telephone Encounter (Signed)
Called [employer]. Representative said the monitor should be there by Friday.  Called patient to let her know. She was appreciative.

## 2018-09-30 ENCOUNTER — Ambulatory Visit (INDEPENDENT_AMBULATORY_CARE_PROVIDER_SITE_OTHER): Payer: Medicare Other

## 2018-09-30 DIAGNOSIS — R000 Tachycardia, unspecified: Secondary | ICD-10-CM | POA: Diagnosis not present

## 2018-09-30 DIAGNOSIS — I4891 Unspecified atrial fibrillation: Secondary | ICD-10-CM | POA: Diagnosis not present

## 2018-09-30 DIAGNOSIS — I959 Hypotension, unspecified: Secondary | ICD-10-CM | POA: Diagnosis not present

## 2018-09-30 DIAGNOSIS — R079 Chest pain, unspecified: Secondary | ICD-10-CM | POA: Diagnosis not present

## 2018-09-30 DIAGNOSIS — R002 Palpitations: Secondary | ICD-10-CM | POA: Diagnosis not present

## 2018-10-04 IMAGING — CR DG CHEST 2V
1 series · 2 of 2 positions shown · non-contrast
Comparison: Portable chest x-ray May 18, 2016

CLINICAL DATA: Cough, wheezing, and shortness of breath for the
past 5 days. Symptoms are unrelieved with nebulizer treatment.
History of CHF, coronary artery disease, COPD, sarcoidosis, and
diabetes.

EXAM:
CHEST  2 VIEW

[Series 1: dg chest 2 view · 0.14mm/px · 2 of 2 slices shown]
[im 1/2]
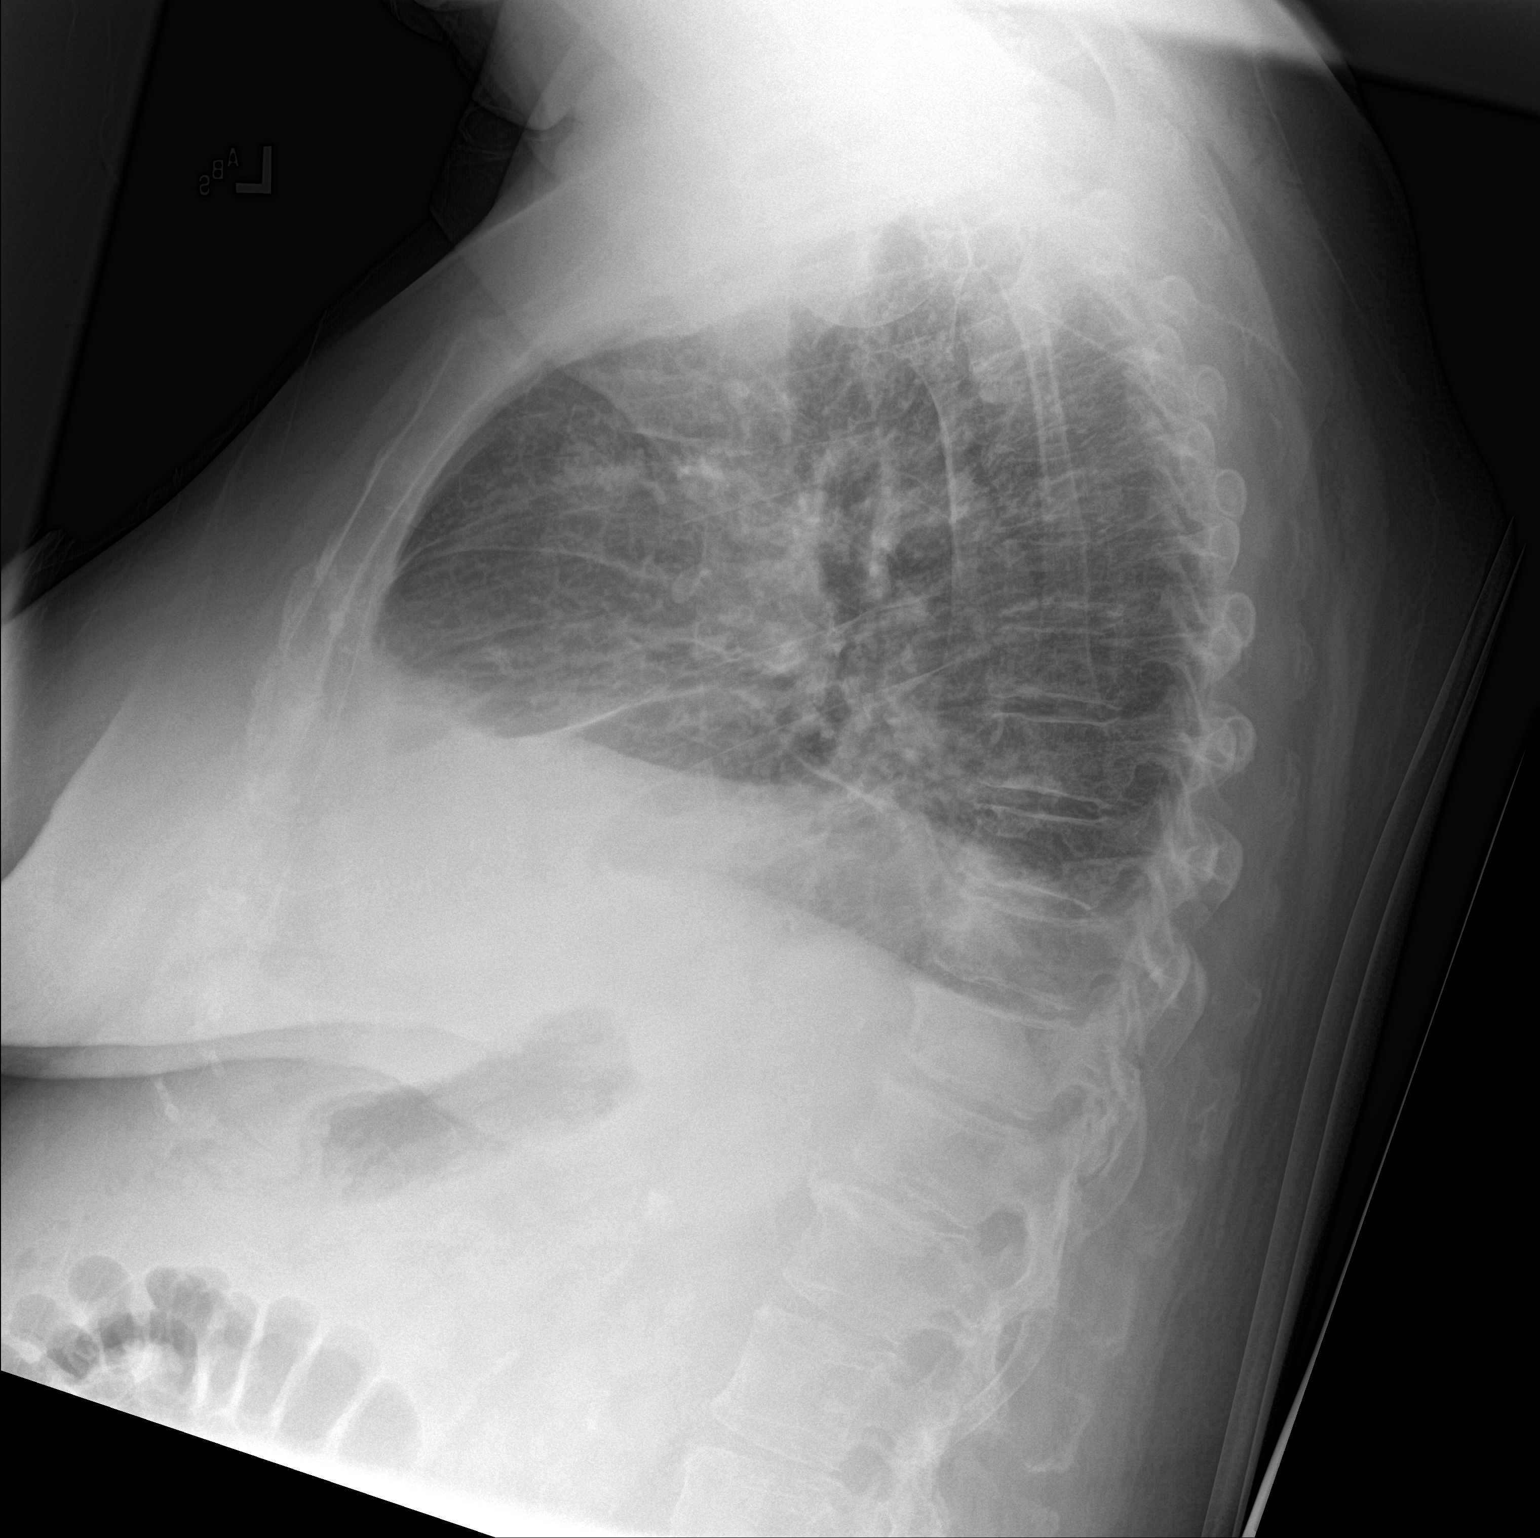
[im 2/2]
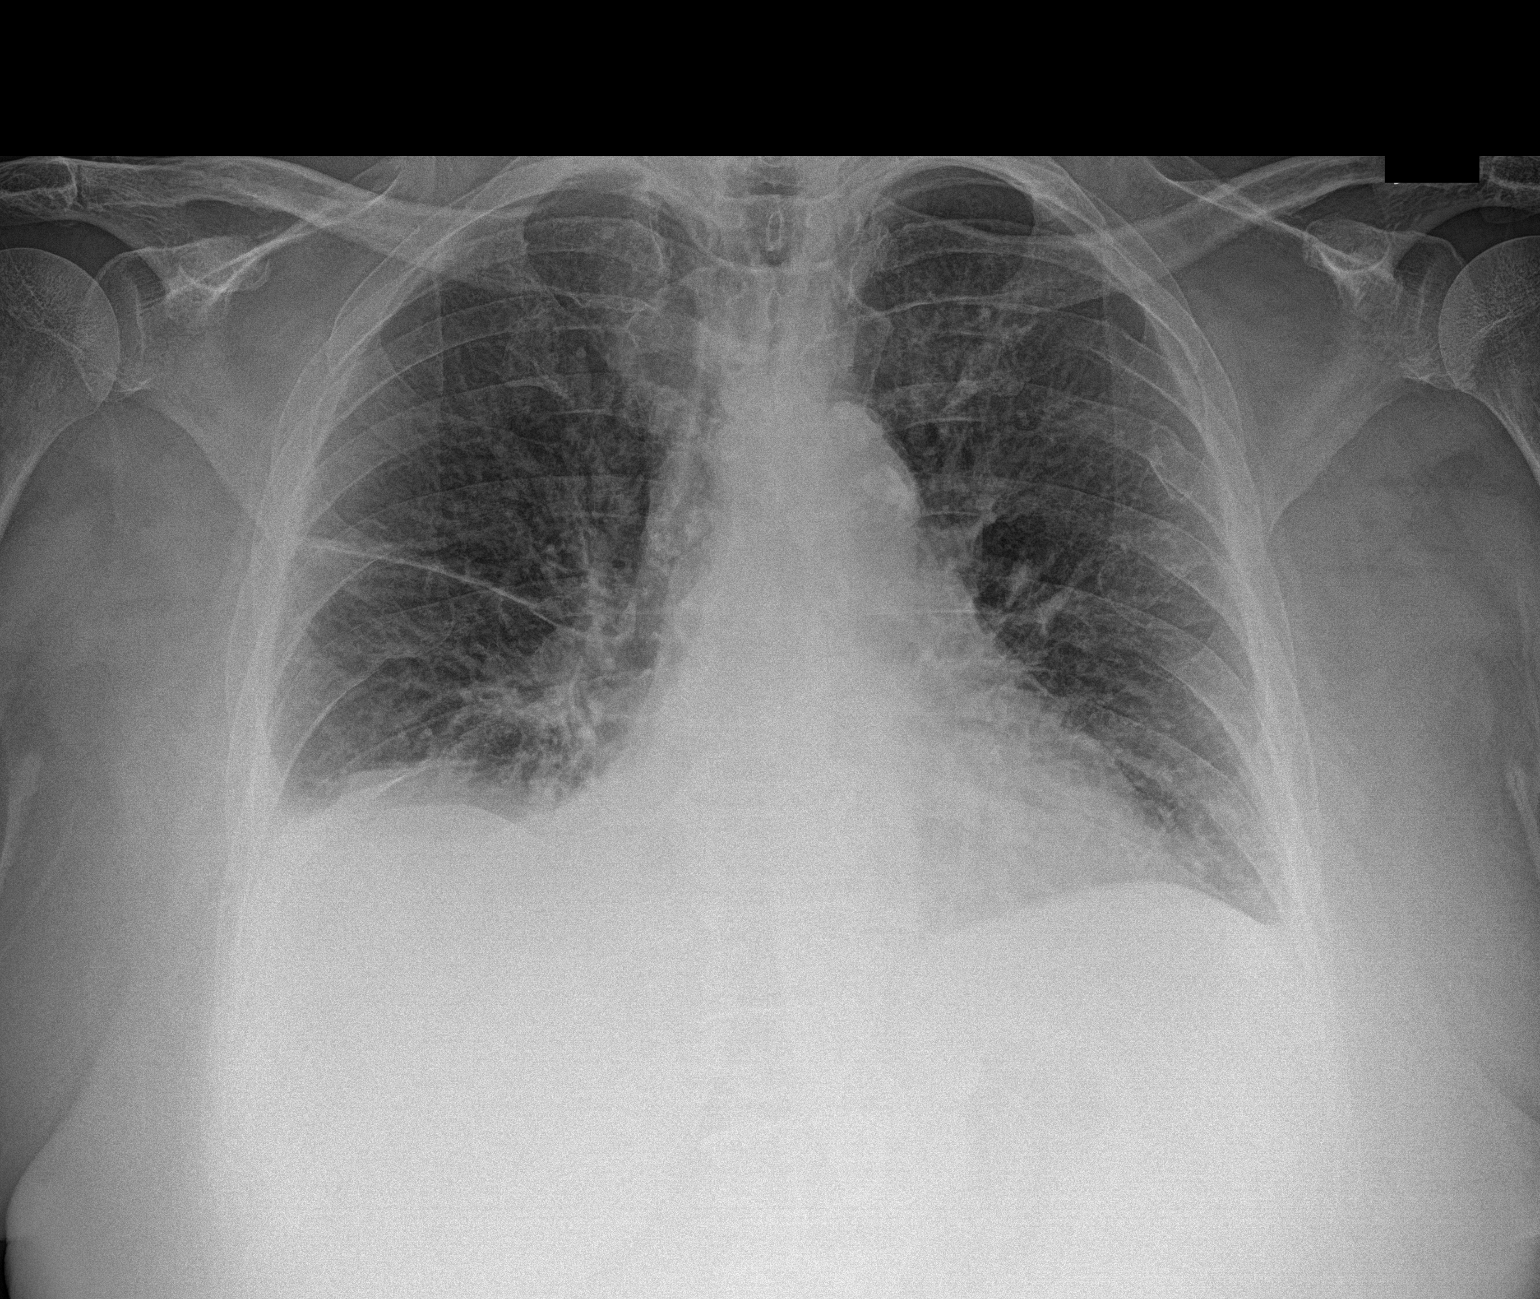

[2 of 2 positions shown; findings below may reference images not displayed]

FINDINGS: The lungs are borderline hypoinflated. The interstitial markings are
diffusely increased. There is patchy density at the right lung base
and thickening of the minor fissure. There is a small amount of
pleural fluid blunting the costophrenic angles on the right. The
heart is top-normal in size. The central pulmonary vascularity is
prominent. There is calcification in the wall of the aortic arch.
The mediastinum is normal in width. The bony thorax exhibits no
acute abnormality.
IMPRESSION: Bilateral hypoinflation. Chronic interstitial prominence bilaterally
with probable superimposed atelectasis or pneumonia at the right
lung base. Small right pleural effusion.

## 2018-10-06 NOTE — Telephone Encounter (Signed)
Returned pt call.[email or web address] 

## 2018-10-06 NOTE — Telephone Encounter (Signed)
Patient calling Patient is still wearing monitor but says whenever she is completed with it she does not know where to send it to Please call to advise

## 2018-10-07 ENCOUNTER — Other Ambulatory Visit: Payer: Self-pay | Admitting: *Deleted

## 2018-10-07 NOTE — Telephone Encounter (Signed)
I called and spoke with the patient. I reviewed the return process for her ZIO monitor with her and answered her questions- the patient verbalized understanding.  She confirms she does have the box from [employer] to return her monitor.

## 2018-10-07 NOTE — Patient Outreach (Signed)
[facility]) Care Management  10/07/2018  Pamela Collier 1940/05/14 [id number]   Telephone follow up call     Patient with [facility] readmission 1/25-1/28, Dx : Acute pulmonary edema, pleural effusion Acute on chronic respiratory failure with hypoxia. Discharge to [facility] for rehab. Patient readmission to 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  PMHX; includes but not limited to, Chronic Diastolic heart failure , OSA on CPAP, Diabetes, vascular dementia , diabetic neuropathy, CVA.   Outreach call to patient she discussed feeling pretty good on today, but has good days and bad days. She discussed having some stiffness in her hands but she continues to work them.   Patient discussed recent episode of chest pain , in which she used nitroglycerin spray and got relief, asked if Dr.Gollan was notified  that she is why she is  wearing a heart monitor until next Friday, then she will send it back.  Patient discussed missing having the home health RN and therapy coming by for visits. She discussed tolerating activity in her home , using her walker, otherwise does not do much walking.   Heart failure  Patient discussed continuing to monitor weights daily and keep a record. She states having difficulty getting her weight back down to 162 and staying there. Patient able to state taking fluid pill twice daily, and skipping taking on Sunday as prescribed by Dr.Gollan . She denies having swelling in her lower legs, denies shortness of breath or chest pain. Patient reports today's weight is 168 and her book that she records weights in is in the other room unable to review recent days but weight does go up and down.   .  Discussed current diet and importance of limiting salt patient , denies adding salt to foods, reviewed foods with hidden salt, processed fast foods. Patient discussed working on limiting fluids but her mouth  gets dry, states drinking at least 4 little plastic bottles of water daily,  discussed measures to help with dry mouth verus drinking extra water. Review of daily fluid recommendations and rationale.  Patient states that her husband and son still help with her with managing her medication, states now pill packaging one in two weeks time frames rather than a month.  Patient will benefit from continued education and support on self care  heart failure management .   Diabetes  Patient reports blood sugar this morning was 92, denies having symptoms of low blood sugar when reviewed, but she drank OJ with her breakfast. Patient denies having episode of bloods sugar of 70 or below. She was able to states recent changes in insulin dose.  Patient reports getting meals weekly, sometimes she eat them and some times her husband or fixes something. She discussed small portions of fresh fruits that comes on meal trays.   Social  Patient states that her son is still her visiting from [place] and that has been a big help with them. Discussed her plan is to move to [place] once the virus is controlled.  Patient states that she continues to stay in, when she does go out she wears a mask.     Plan Will plan return call in the next month Reinforced sign and symptoms of worsening heart failure to notify MD of sooner, sudden weight gain of 2 pounds in a day or 5 in week., increased shortness of breath, swelling or chest pain.   [name], RN, PCCN  [place] Management Coordinator  [phone]- Mobile [phone]- Toll Free Main Office

## 2018-10-08 ENCOUNTER — Other Ambulatory Visit: Payer: Self-pay | Admitting: Pharmacy Technician

## 2018-10-08 ENCOUNTER — Telehealth: Payer: Self-pay | Admitting: Pharmacist

## 2018-10-08 NOTE — Telephone Encounter (Signed)
Received message from [facility], CPhT that patient was confused about the plan for patient assistance. I had discussed the switch from insulin 75/25 to [place] with her and her husband last week during our virtual visit.   Reminded her of this process. Asked if she had received the application in the mail yet (in a [place]) and she noted that she has not yet. I asked her to keep an eye out, and to ask her husband if he had received it.   She also noted that she needed a refill on test strips, and that there was an issues filling them at the pharmacy. She uses an Accu-Chek meter. [facility]; they aren't able to fill Accu-Chek branded products, but can provide the patient with a Contour Next meter and can run the prescription for Contour strips that they had on file for the patient.   Called the patient back, husband stated that they were originally trying to fill the strips at [facility], and that [facility] didn't have the correct brand of strips on the prescription. He preferred to get all medications/devices at [facility]; [facility] is going to fill a Contour Next meter + test strips for the patient this afternoon. Patient and husband are amenable to changing to this new meter.   Will call back next week to ensure the patient was able to get meter + strips.   Catie Darnelle Maffucci, PharmD, [place] PGY2 Ambulatory Care Pharmacy Resident, [facility] Phone: [phone]

## 2018-10-08 NOTE — Patient Outreach (Signed)
[facility]) Care Management  10/08/2018  LACHELE LIEVANOS 1940/11/17 [id number]  Successful outreach call placed to patient in regards to [employer] application for Tresiba.  Spoke to patient, HIPAA identifiers verified.  Inquired if patient had received the application in the mail. Patient informed as to being confused as to her medication regimen for diabetes. She also inquired as to why she would need to apply for patient assistance and that she gets her medications packaged.Informed patient his was an injection and she informed she had a lot of injectables for diabetes in the refrigerator.  Informed patient I would outreach [facility] Catie Darnelle Maffucci who is the embedded pharmacist to outreach patient about all of her questions and concerns. Will follow up based on the conversation the pharmacist has with the patient.  Emin Foree P. Shakima Nisley, [place] Management [phone]

## 2018-10-13 ENCOUNTER — Other Ambulatory Visit: Payer: Self-pay

## 2018-10-13 ENCOUNTER — Telehealth (HOSPITAL_COMMUNITY): Payer: Self-pay

## 2018-10-13 MED ORDER — GLUCOSE BLOOD VI STRP: ORAL_STRIP | 12 refills | Status: AC

## 2018-10-13 NOTE — Telephone Encounter (Signed)
Had a telephone visit with Pamela Collier today.  She states doing pretty good, she has been packing little bit by little bit.  She states she will be moving to [place] with her son the end of July.  She states wears her tennis shoes and her feet are not swelling as much and not hurting.  She is getting around a little better.  She denies chest pain, shortness of breath, headaches or dizziness.  Her weight is going down today was 162 lbs.  BGL 115, she states her sugars has been better.  Verified her medications and she states she has all of them.  Discussed food, she still eating mostly Moms meals.  Discussed she dont care too much of the vegetables but really like the breakfast, advised her she can call and order what she likes. She states wearing the cardiac monitor and it comes off tomorrow.  She states still has that pitter patter in her chest when she lays down.  She is laying on two pillows and elevating herself.  She drinks water and watches how much she drinks.  She weighs daily.  She has doctors in [place] that she has seen before.  Will continue to visit with her by phone or in person til the move.   [place] [phone]

## 2018-10-18 ENCOUNTER — Ambulatory Visit: Payer: Medicare Other | Admitting: Pharmacist

## 2018-10-18 ENCOUNTER — Telehealth: Payer: Self-pay | Admitting: Pharmacist

## 2018-10-18 NOTE — Telephone Encounter (Signed)
@  [email or web address]   S:     Chief Complaint  Patient presents with  . Medication Management    Patient contacted telephonically for diabetes evaluation, education, and management at the request of Dr. Derrel Nip. Last seen by primary care provider on 09/10/2018. Last visit with pharmacy clinic on 09/27/2018 - at that time, insulin 75/25 was dose reduced to prevent hypoglycemia, and we started the application process for [place] patient assistance.   Today, patient notes that she has not received the [place] application in the mail. She denies any episodes of BG <90, though multiple mornings with fastings in 90-100s. She reports evening sugars no higher than 194, mostly <180. She is not checking BG during the day. She was able to pick up the new Contour glucometer with supplies from [employer].    Insurance coverage/medication affordability: Medicare A/B + BCBS Medicare  Patient reports adherence with medications.  Current diabetes medications include: Humalog 75/25 mix 10 units QAM, 20 units QPM  O:  Lab Results  Component Value Date   HGBA1C 7.7 (H) 61/95/0932    Basic Metabolic Panel BMP Latest Ref Rng & Units 09/14/2018 07/31/2018 07/30/2018  Glucose 70 - 99 mg/dL 156(H) 226(H) 67(L)  BUN 6 - 23 mg/dL 24(H) 20 21  Creatinine 0.40 - 1.20 mg/dL 1.33(H) 1.17(H) 1.27(H)  BUN/Creat Ratio 12 - 28 - - -  Sodium 135 - 145 mEq/L 138 140 143  Potassium 3.5 - 5.1 mEq/L 3.9 4.2 3.1(L)  Chloride 96 - 112 mEq/L 101 107 108  CO2 19 - 32 mEq/L 28 26 28   Calcium 8.4 - 10.5 mg/dL 9.4 8.8(L) 8.9   Lipid Panel     Component Value Date/Time   CHOL 179 07/31/2018 0458   CHOL 134 03/19/2012 0024   TRIG 192 (H) 07/31/2018 0458   TRIG 110 03/19/2012 0024   HDL 49 07/31/2018 0458   HDL 41 03/19/2012 0024   CHOLHDL 3.7 07/31/2018 0458   VLDL 38 07/31/2018 0458   VLDL 22 03/19/2012 0024   LDLCALC 92 07/31/2018 0458   LDLCALC 71 03/19/2012 0024   LDLDIRECT 102.0 03/14/2016 1452    Self-Monitored  Blood Glucose Results Fasting SMBG: Fasting 94, 100s Bedtime: 170s-190s  A/P: #Diabetes - Currently uncontrolled with most recent A1c 7.7%, though with history of severe hypoglycemia. Goal moving forward is to achieve appropriate glucose lowering with reducing risk of hypoglycemia. Plan is to initiate Tresiba from patient assistance program.  - For now, continue Humalog 75/25 mix 10 units QAM, 20 units QPM. Continue to monitor glucose BID.  - Patient will come by clinic today to provide patient signature and required income documentation for [place] patient assistance application.  - Goal is to establish patient on Tresiba therapy prior to her moving to [place] at the end of July. Novo Nordisk ships a 3 month supply, so she will have time to establish care with a new provider to send an updated prescription to [employer] [facility]. Will plan to counsel the husband and son on this process.      Follow up in Pharmacist Clinic phone call PRN as patient assistance application process progresses.   De Hollingshead, PharmD, [place] PGY2 Ambulatory Care Pharmacy Resident Phone: [phone]

## 2018-10-18 NOTE — Telephone Encounter (Signed)
  I have reviewed the above information and agree with above.   Lunna Vogelgesang, MD 

## 2018-10-19 ENCOUNTER — Other Ambulatory Visit: Payer: Self-pay | Admitting: Internal Medicine

## 2018-10-19 DIAGNOSIS — M19011 Primary osteoarthritis, right shoulder: Secondary | ICD-10-CM

## 2018-10-19 DIAGNOSIS — M545 Low back pain, unspecified: Secondary | ICD-10-CM

## 2018-10-19 DIAGNOSIS — I255 Ischemic cardiomyopathy: Secondary | ICD-10-CM

## 2018-10-19 DIAGNOSIS — M19012 Primary osteoarthritis, left shoulder: Secondary | ICD-10-CM

## 2018-10-19 DIAGNOSIS — R2681 Unsteadiness on feet: Secondary | ICD-10-CM

## 2018-10-19 NOTE — Progress Notes (Signed)
E 

## 2018-10-20 ENCOUNTER — Telehealth: Payer: Self-pay | Admitting: Cardiovascular Disease

## 2018-10-20 DIAGNOSIS — R002 Palpitations: Secondary | ICD-10-CM | POA: Diagnosis not present

## 2018-10-20 NOTE — Telephone Encounter (Signed)
Please call patient with monitor results. 

## 2018-10-20 NOTE — Telephone Encounter (Addendum)
Spoke with the pt who sts that she completed and mailed her zio-monitor back about a week ago. Adv the pt that her monitor results are not yet available. Once uploaded they will need to be reviewed by Dr.Gollan. Adv the pt that we will contact her with the results and Dr. Donivan Scull recommendations when available. Pt verbalized understanding and voiced appreciation for the call.

## 2018-10-21 ENCOUNTER — Other Ambulatory Visit: Payer: Self-pay

## 2018-10-21 DIAGNOSIS — B351 Tinea unguium: Secondary | ICD-10-CM | POA: Diagnosis not present

## 2018-10-21 DIAGNOSIS — E1042 Type 1 diabetes mellitus with diabetic polyneuropathy: Secondary | ICD-10-CM | POA: Diagnosis not present

## 2018-10-21 DIAGNOSIS — L851 Acquired keratosis [keratoderma] palmaris et plantaris: Secondary | ICD-10-CM | POA: Diagnosis not present

## 2018-10-22 ENCOUNTER — Other Ambulatory Visit: Payer: Self-pay | Admitting: Pharmacy Technician

## 2018-10-22 IMAGING — CR DG CHEST 2V
2 series · 2 of 2 positions shown · non-contrast
Comparison: 06/11/2016 and 05/18/2016 radiographs.

CLINICAL DATA: Left chest and low back pain with bilateral leg
numbness today. Shortness of breath. History of congestive heart
failure, COPD and diabetes.

EXAM:
CHEST  2 VIEW

[chest lat]
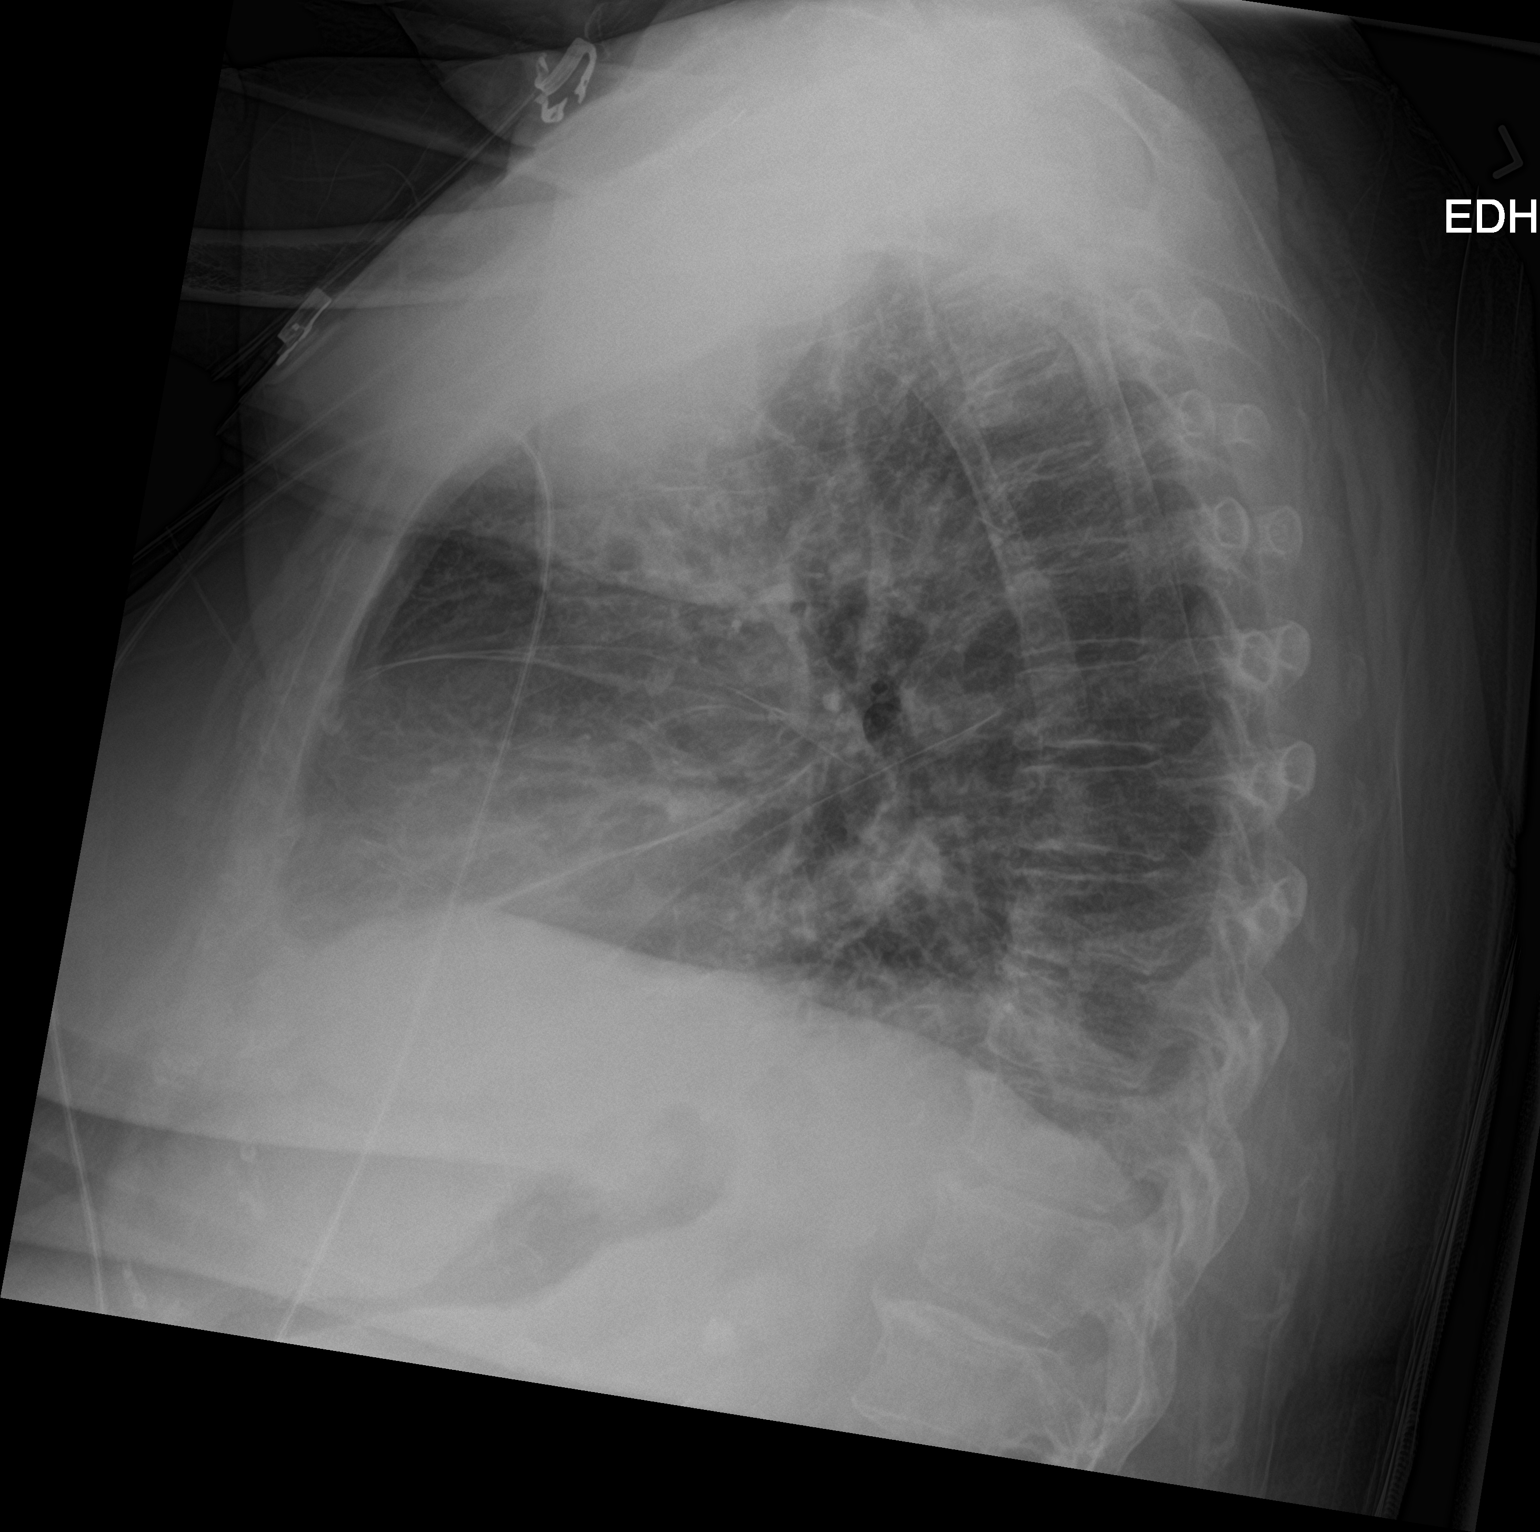

[chest ap]
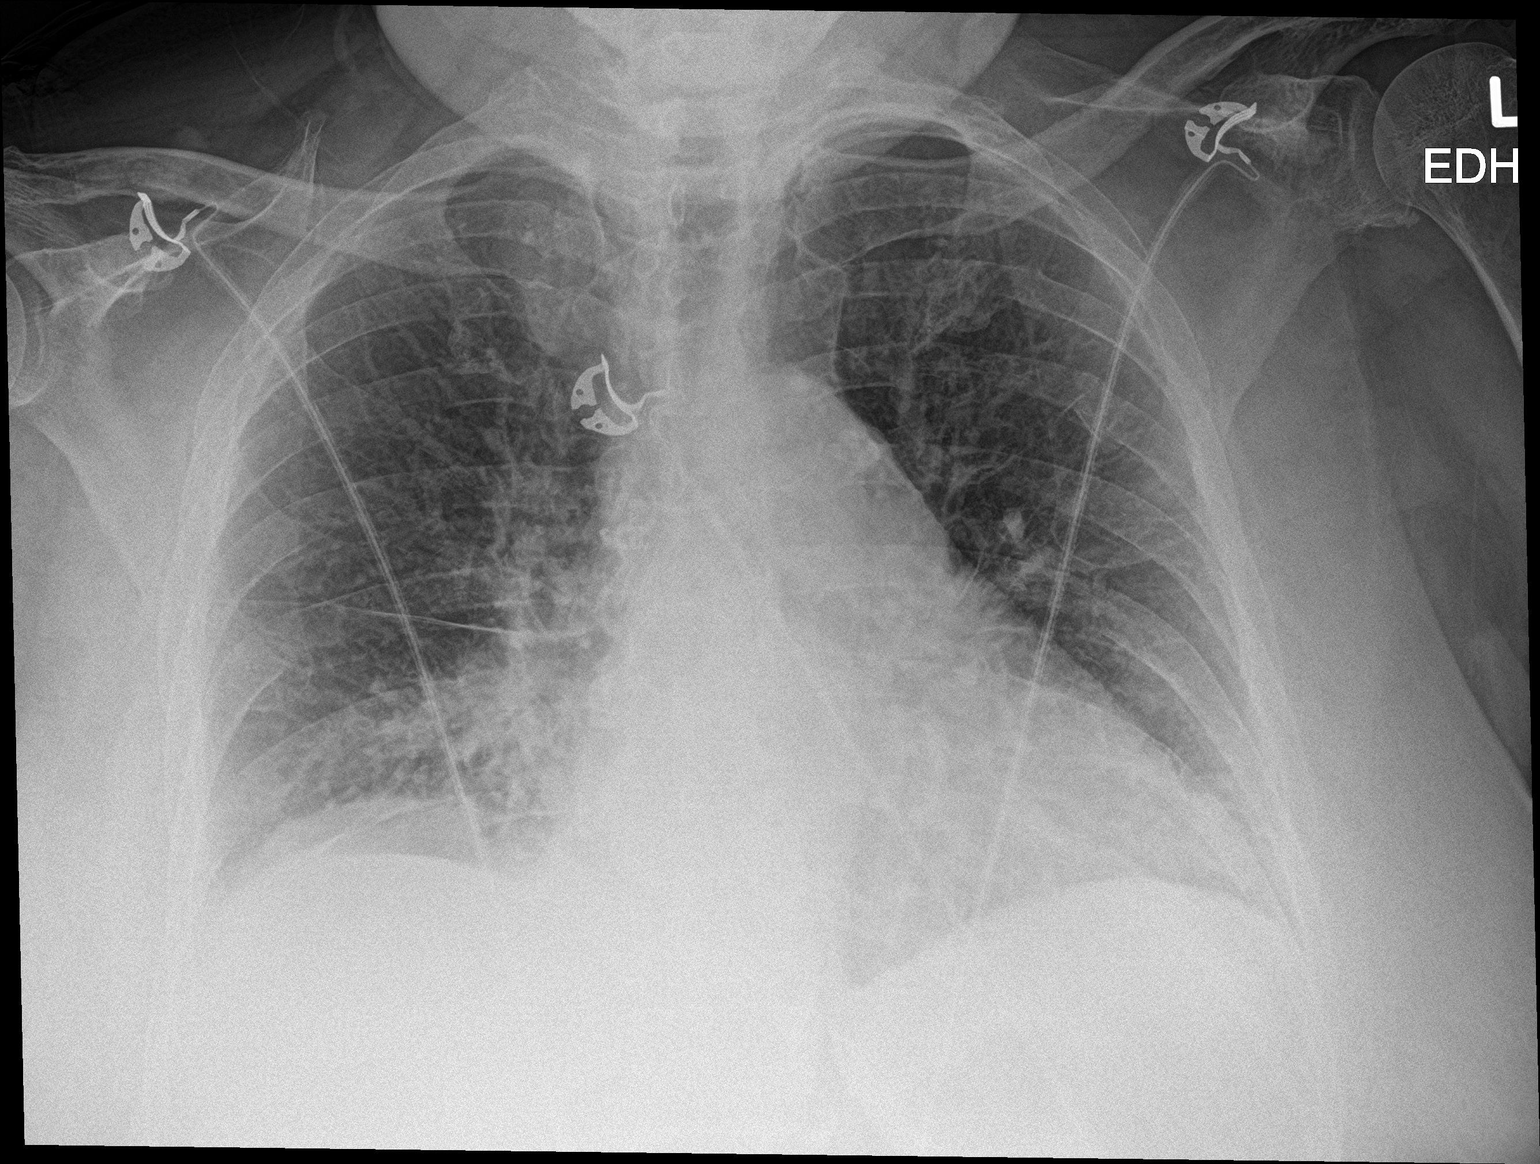

[2 of 2 positions shown; findings below may reference images not displayed]

FINDINGS: There is stable mild cardiomegaly. There is chronic interstitial
prominence with central airway and fissural thickening. No overt
pulmonary edema, confluent airspace opacity or significant pleural
effusion is identified. Degenerative changes are present throughout
the spine. There are old rib fractures on the left.
IMPRESSION: Chronic interstitial prominence, similar to prior studies, and mild
cardiomegaly. No acute findings demonstrated.

## 2018-10-22 IMAGING — MR MR MRA CHEST W/ OR W/O CM
12 series · 16 of 16 positions shown · IV contrast (18mL MULTIHANCE)
Comparison: None.

CLINICAL DATA: Chest pain

EXAM:
MRA CHEST WITH CONTRAST
TECHNIQUE: Multiplanar, multiecho pulse sequences of the chest were obtained
with intravenous contrast. Angiographic images of abdomen and pelvis
were obtained using MRA technique with intravenous contrast.
CONTRAST:  18mL MULTIHANCE GADOBENATE DIMEGLUMINE 529 MG/ML IV SOLN

[Series 3: T2 fat-sat · coronal · 8.0mm · 0.78mm/px · 1 of 17 slices shown (1 of 2)]
[im 1/17]
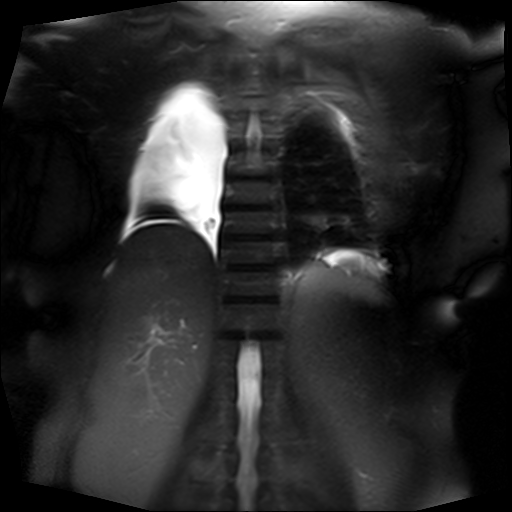

[Series 4: T2 fat-sat · axial · 8.0mm · 0.74mm/px · 1 of 19 slices shown (2 of 2)]
[im 1/19]
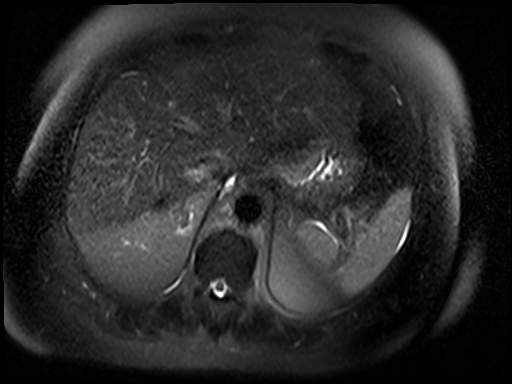

[Series 5: T2 · axial · 6.0mm · 1.27mm/px · 1 of 30 slices shown (1 of 2)]
[im 1/30]
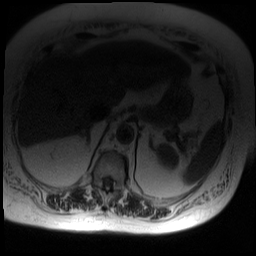

[Series 6: T2 · sagittal · 6.0mm · 0.74mm/px · 1 of 19 slices shown (2 of 2)]
[im 1/19]
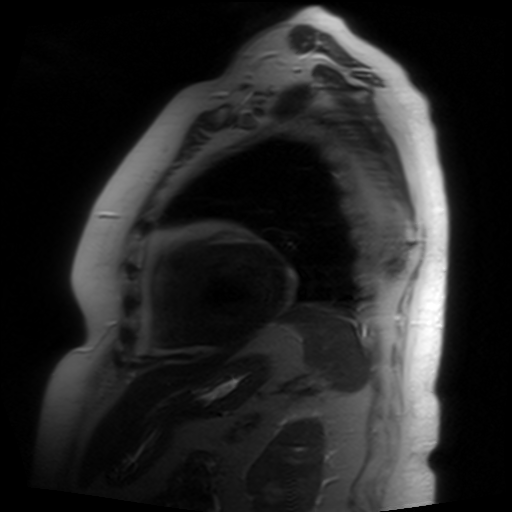

[Series 7: t2_trufi_tra candy cane · sagittal · 6.0mm · 1.27mm/px · 1 of 22 slices shown]
[im 1/22]
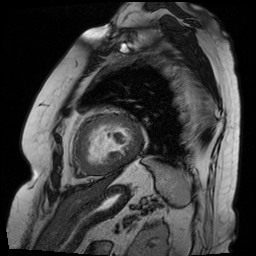

[Series 8: t1_tfl_cor_mbh dark blood · coronal · 6.0mm · 0.78mm/px · 1 of 20 slices shown]
[im 1/20]
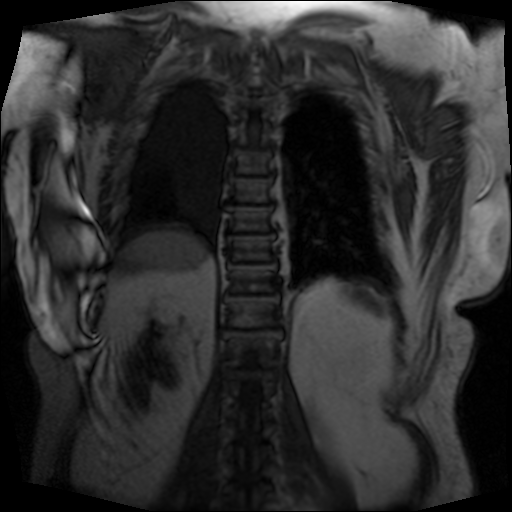

[Series 9: t1_tfl_ax_mbh dark blood · axial · 6.0mm · 0.74mm/px · 1 of 30 slices shown]
[im 1/30]
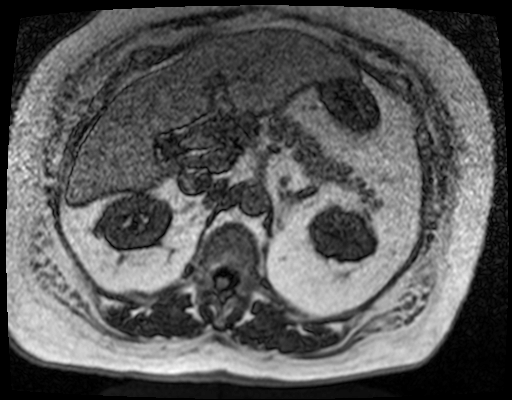

[Series 19: MRA · sagittal · 1.2mm · 1.09mm/px · 2 of 80 slices shown (1 of 2)]
[im 1/80]
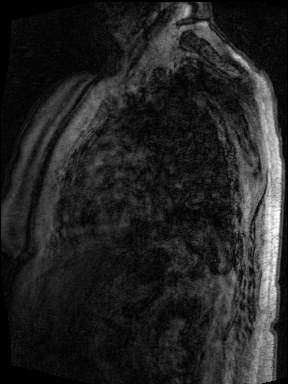
[im 80/80]
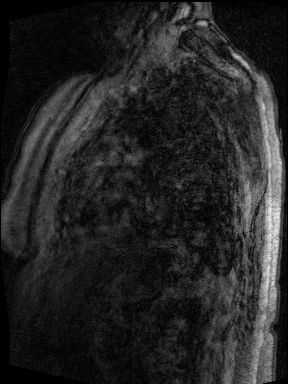

[Series 20: MRA · sagittal · 1.2mm · 1.09mm/px · 2 of 80 slices shown (2 of 2)]
[im 1/80]
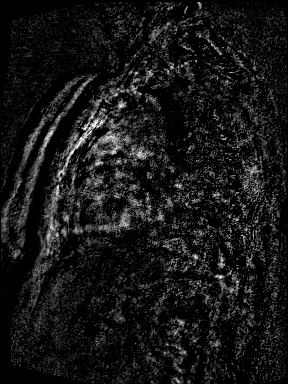
[im 80/80]
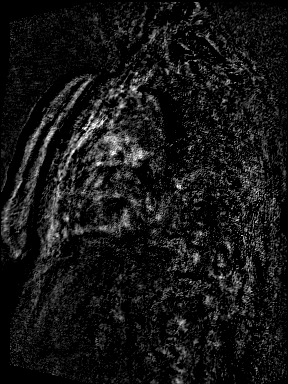

[Series 28: T1 dynamic fat-sat post-contrast · axial · 2.5mm · 0.70mm/px · z∈[-81,+77]mm · 2 of 64 slices shown (1 of 2)]
[im 1/64]
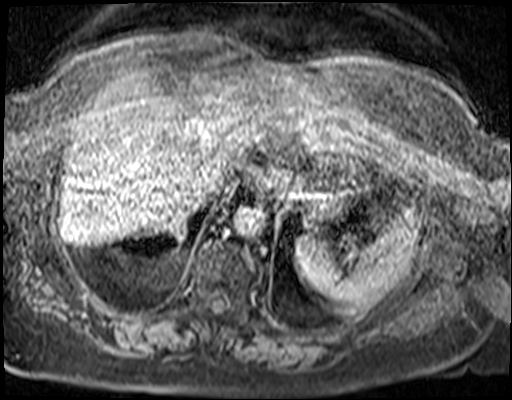
[im 64/64]
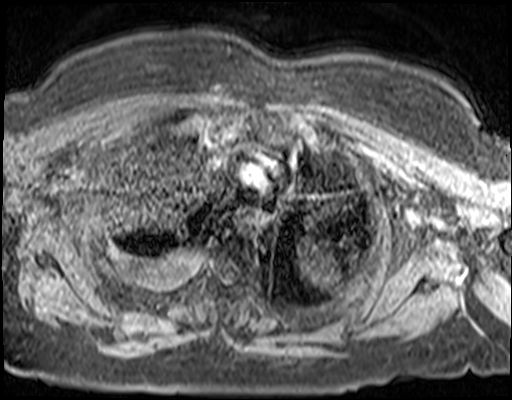

[Series 30: T1 dynamic fat-sat post-contrast · sagittal · 2.5mm · 0.70mm/px · 2 of 60 slices shown (2 of 2)]
[im 1/60]
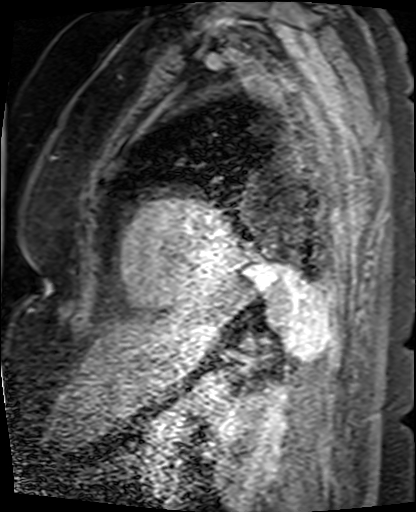
[im 60/60]
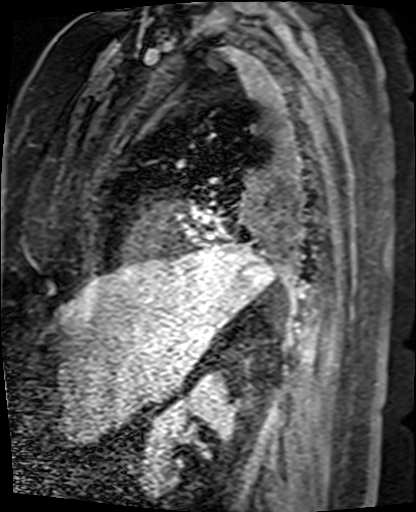

[Series 100: chest angio rotate · axial · 0.87mm/px · 1 of 1 slices shown]
[im 1/1]
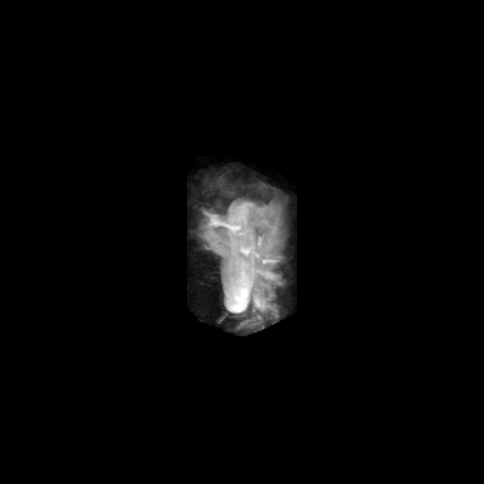

[16 of 16 positions shown; findings below may reference images not displayed]

FINDINGS: There is severe motion artifact severely limiting this exam. Aortic
dissection cannot be excluded by this examination.

There is no obvious intramural hematoma in the thoracic aorta. The
ascending aorta is non aneurysmal. There is no obvious dissection
flap. There is artifact along the aortic arch and descending aorta
due to motion and a small dissection cannot be entirely excluded.
Innominate artery, proximal right subclavian artery, right common
carotid artery are grossly patent. Left common carotid artery is
poorly visualized but is probably patent. Left subclavian artery is
also very poorly visualized.

There is mild narrowing at the origin of the celiac axis. SMA is
grossly patent. Bilateral renal arteries are grossly patent.

Small right pleural effusion is present. Tiny left pleural effusion.

Visualized liver and spleen are grossly within normal limits.
IMPRESSION: Very limited exam secondary to motion artifact. Although there is no
obvious intramural hematoma or aortic dissection, small aortic
dissection cannot be excluded by this examination.

Small right pleural effusion.  Tiny left pleural effusion.

## 2018-10-22 NOTE — Patient Outreach (Signed)
[place] [facility]) Care Management  10/22/2018  Pamela Collier 08-22-1940 [id number]   Successful outreach call placed to patient in regards to [employer] application for Tresiba.  Spoke to patient, HIPAA identifiers verified.  Informed patient that we had received her application back but [employer] requires proof of finances. Patient informed her husband mailed that last month when the applications were received. Inquired if patient mailed it in the pre paid envelope that was provided and she was unsure. She informed her husband was not home presently so she would not be able to ask him.  Patient wanted me to send a message to Dr. Derrel Nip to inquire if she thought it would be ok and safe for patient to travel to [place] next week.  Will route note to [place] to update her on the patient assistance process and will route note to Dr. Deborra Medina about the question.  Lisett Dirusso P. Mackenzy Eisenberg, [place] Management [phone]

## 2018-10-24 IMAGING — DX DG CERVICAL SPINE COMPLETE 4+V
6 series · 6 of 6 positions shown · non-contrast
Comparison: None.

CLINICAL DATA: Osteoporosis, neck pain, recent fall

EXAM:
CERVICAL SPINE - COMPLETE 4+ VIEW

[cervical spine ap]
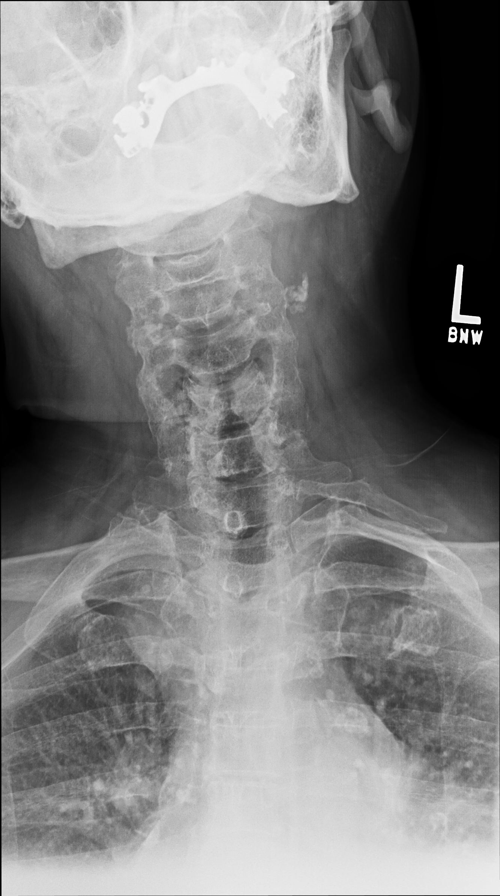

[cervical spine oblique (1 of 2)]
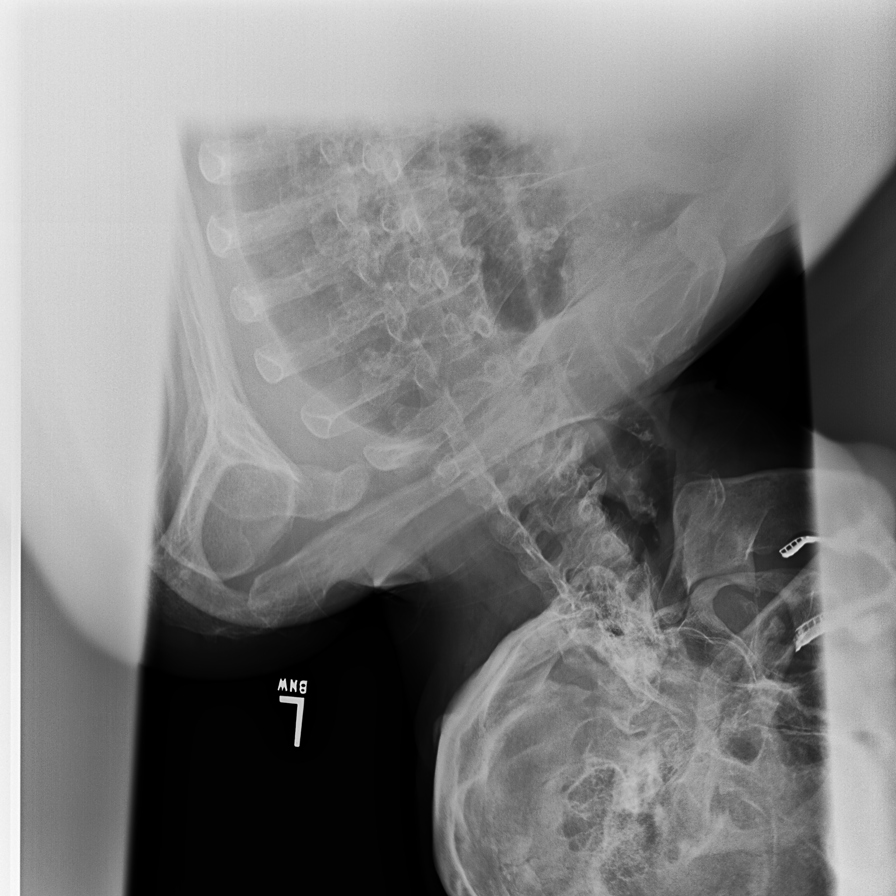

[cervical spine oblique (2 of 2)]
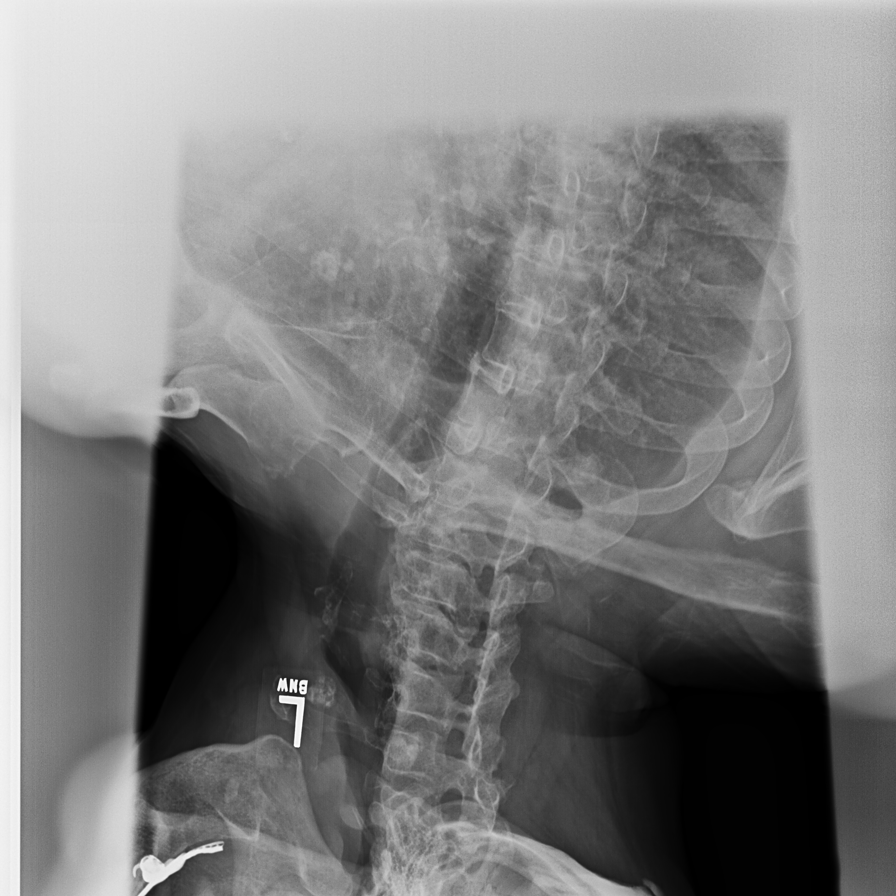

[cervical spine lat]
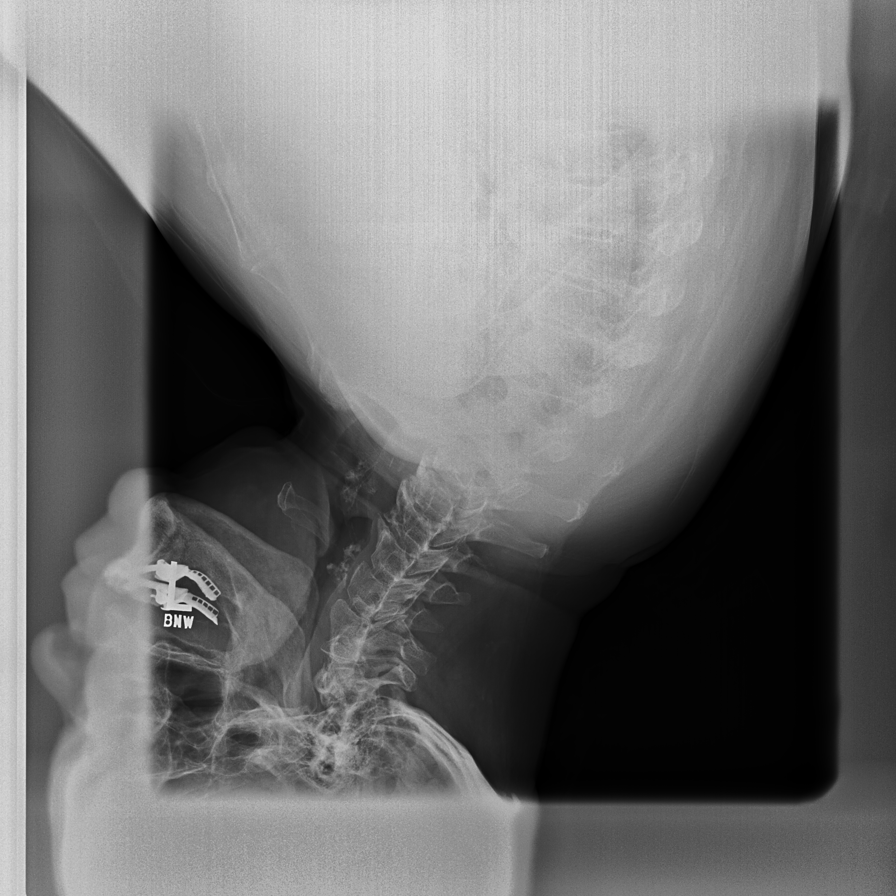

[swimmers lat]
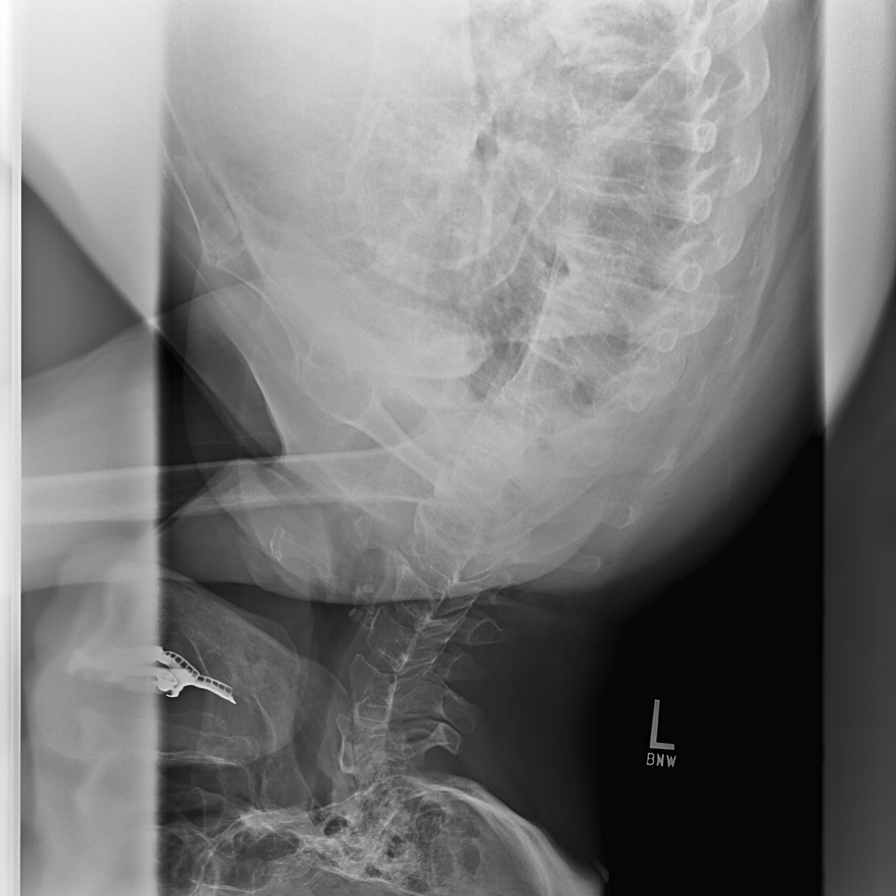

[cervical spine open mouth ap]
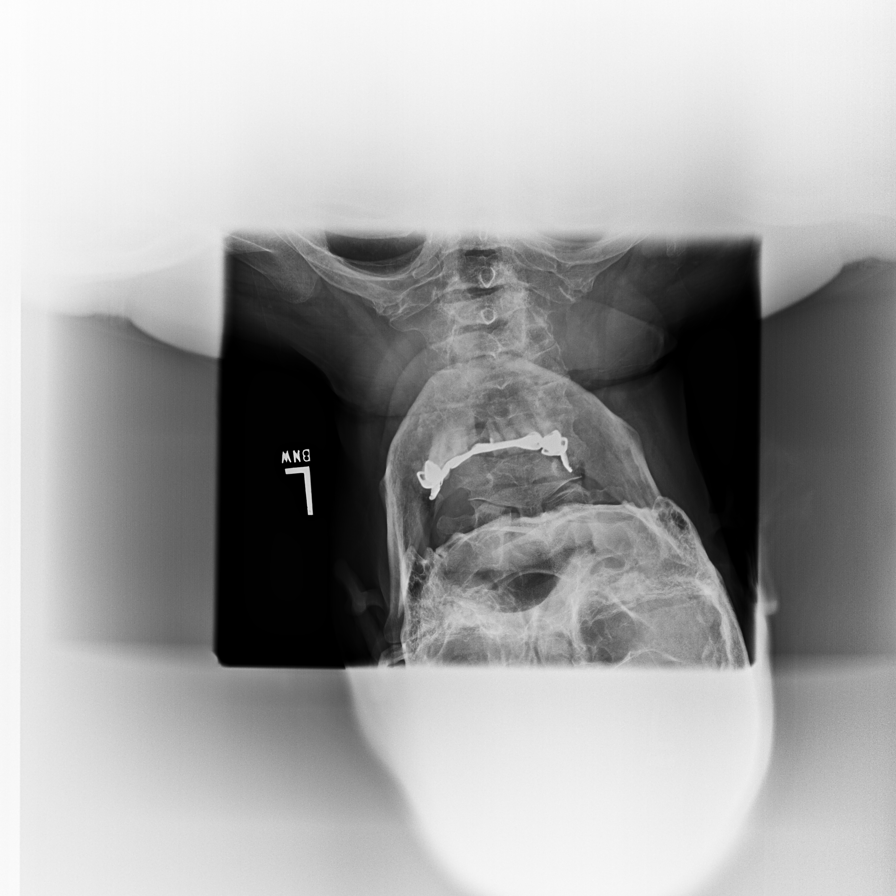

[6 of 6 positions shown; findings below may reference images not displayed]

FINDINGS: There straightened alignment of the cervical vertebrae. There is
degenerative disc disease at C4-5, C5-6, and C6-7 levels. At these
levels there is loss of disc space, sclerosis, and spurring present.
No acute fracture is seen. No prevertebral soft tissue swelling is
noted. On oblique views there is mild foraminal narrowing at C5-6.
The odontoid process appears intact. The lung apices are clear.
IMPRESSION: Straightened alignment with degenerative disc disease at C4-5, C5-6,
and C6-7 levels.

## 2018-10-24 IMAGING — DX DG THORACIC SPINE 3V
2 series · 2 of 2 positions shown · non-contrast
Comparison: Chest x-ray of 06/29/2016

CLINICAL DATA: Recent fall, left-sided chest wall pain

EXAM:
THORACIC SPINE - 3 VIEWS

[thoracic spine ap]
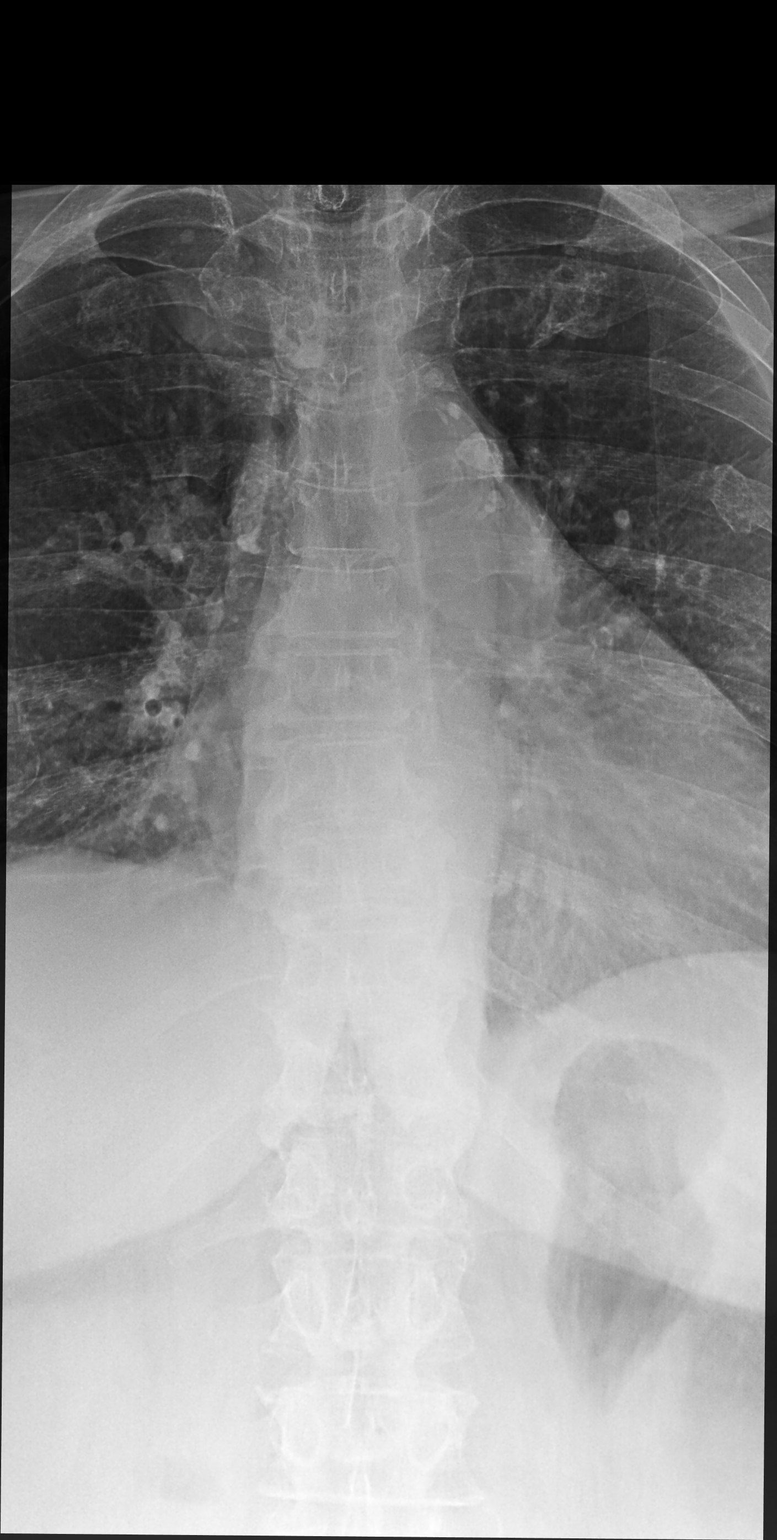

[thoracic spine lat]
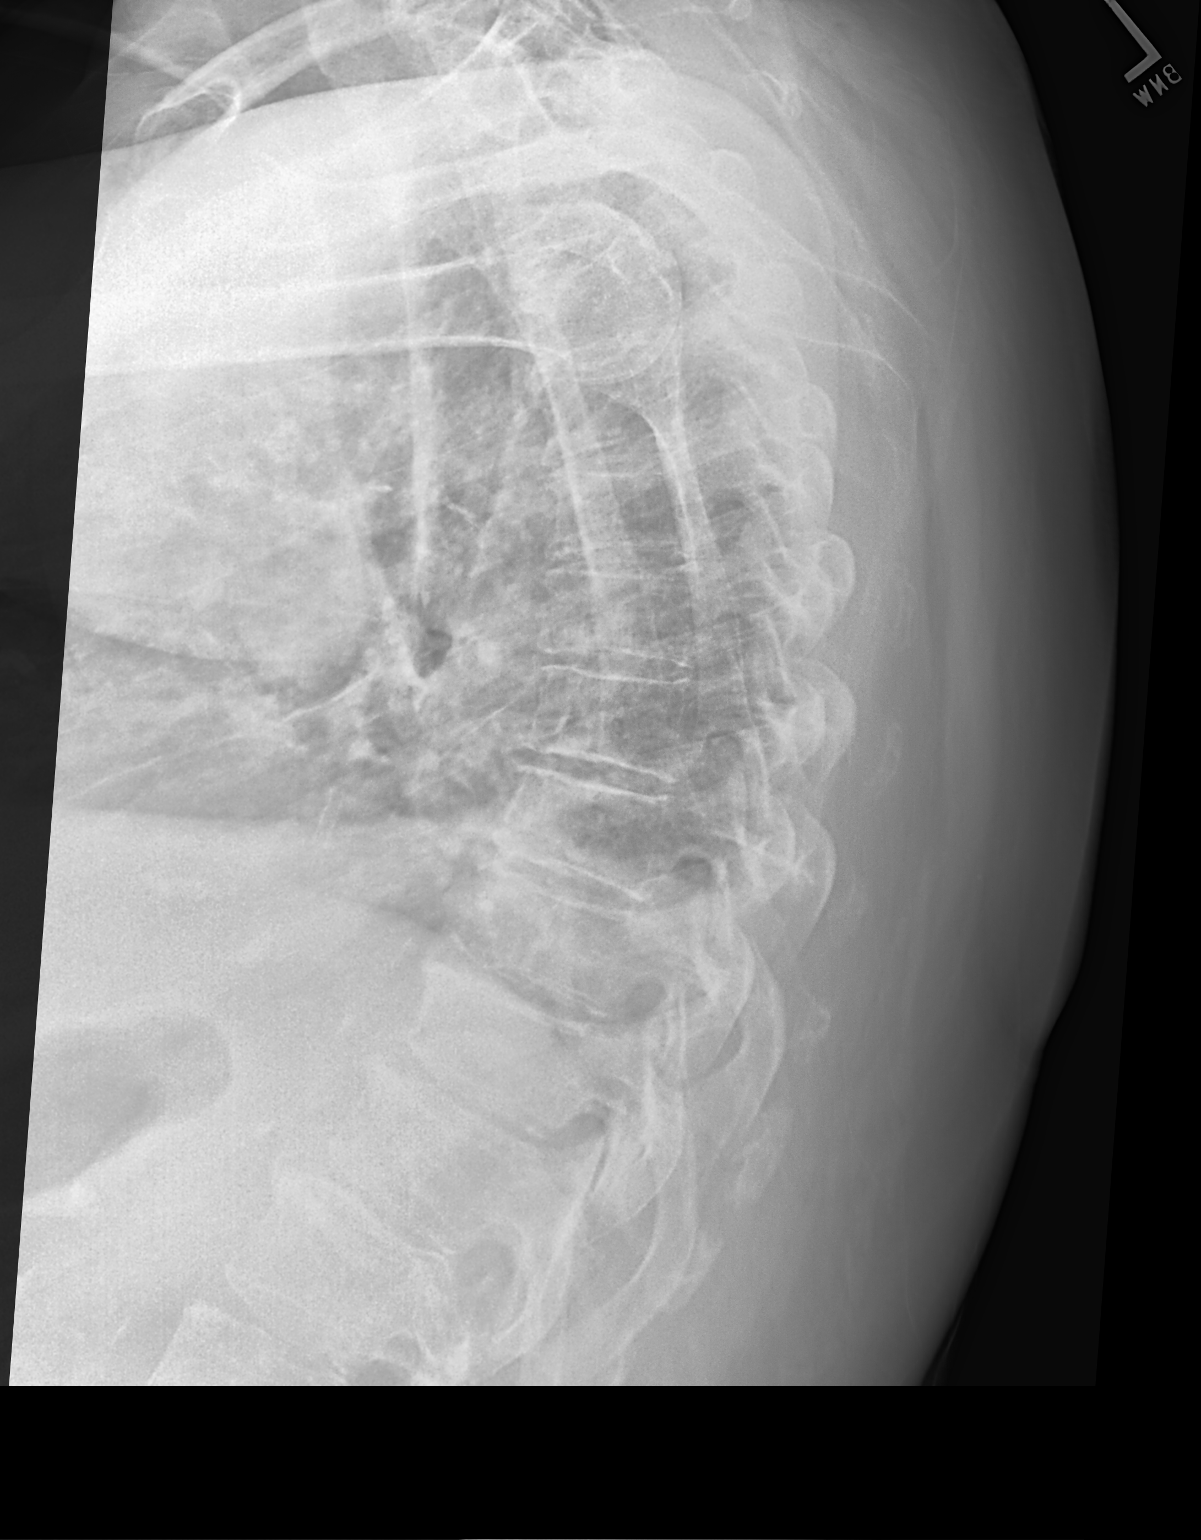

[2 of 2 positions shown; findings below may reference images not displayed]

FINDINGS: The bones appear osteopenic. However no acute compression deformity
is seen. Intervertebral disc spaces appear normal with mild
degenerative change in the lower thoracic spine. No prominent
paravertebral soft tissue is seen. Calcified mediastinal and hilar
nodes are present most likely due to prior granulomatous disease.
IMPRESSION: Osteopenia and mild degenerative change. No acute compression
deformity.

## 2018-10-25 ENCOUNTER — Other Ambulatory Visit: Payer: Self-pay | Admitting: Internal Medicine

## 2018-10-25 DIAGNOSIS — I10 Essential (primary) hypertension: Secondary | ICD-10-CM

## 2018-10-26 ENCOUNTER — Other Ambulatory Visit: Payer: Self-pay | Admitting: Cardiovascular Disease

## 2018-10-27 ENCOUNTER — Other Ambulatory Visit: Payer: Self-pay | Admitting: Internal Medicine

## 2018-10-28 ENCOUNTER — Other Ambulatory Visit: Payer: Self-pay | Admitting: *Deleted

## 2018-10-28 ENCOUNTER — Telehealth (HOSPITAL_COMMUNITY): Payer: Self-pay

## 2018-10-28 NOTE — Patient Outreach (Signed)
[facility]) Care Management  [place]  10/28/2018   Pamela Collier 1940/10/08 [id number]    Telephone follow up call    Patient with [facility] readmission 1/25-1/28, Dx : Acute pulmonary edema, pleural effusion Acute on chronic respiratory failure with hypoxia. Discharge to [facility] for rehab. 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  PMHX; includes but not limited to, Chronic Diastolic heart failure , OSA on CPAP, Diabetes, vascular dementia , diabetic neuropathy, CVA.    Subjective:  Scheduled outreach follow up outreach call to patient, no answer phone message request entering remote access code.   Attempted 2nd call no answer received same message as above   Encounter Medications:  Outpatient Encounter Medications as of 10/28/2018  Medication Sig Note  . acetaminophen (TYLENOL) 500 MG tablet Take 1,000 mg by mouth every 6 (six) hours as needed for mild pain or headache.    . allopurinol (ZYLOPRIM) 100 MG tablet TAKE 1 TABLET BY MOUTH DAILY   . ALPRAZolam (XANAX) 0.25 MG tablet Take 1 tablet (0.25 mg total) by mouth 2 (two) times daily as needed for anxiety.   [name] amLODipine (NORVASC) 10 MG tablet TAKE 1 TABLET BY MOUTH DAILY   . aspirin EC 81 MG tablet Take 81 mg by mouth daily.   . B-D UF III MINI PEN NEEDLES 31G X 5 MM MISC USE THREE TIMES A DAY   . blood glucose meter kit and supplies Dispense based on patient and insurance preference. Use up to three times daily as directed. 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 dicyclomine (BENTYL) 10 MG capsule TAKE 1 CAPSULE BY MOUTH 4 TIMES A DAY   . diphenoxylate-atropine (LOMOTIL) 2.5-0.025 MG tablet Take 1 tablet by mouth 4 (four) times daily as needed for diarrhea or loose stools.   . donepezil (ARICEPT) 10 MG tablet Take 10 mg by mouth at bedtime.   . feeding supplement, GLUCERNA SHAKE, (GLUCERNA SHAKE) LIQD Take 237 mLs by mouth 2  (two) times daily between meals.   . fluticasone (FLONASE) 50 MCG/ACT nasal spray Place 2 sprays into both nostrils daily as needed for rhinitis.   . furosemide (LASIX) 20 MG tablet Take 1 tablet (20 mg) by mouth twice daily 09/27/2018: 2 tablets BID   . glucose blood test strip Use to check blood sugars 3 times daily. Dx code: E11.21   . guaiFENesin (MUCINEX) 600 MG 12 hr tablet Take 600 mg by mouth 2 (two) times daily.   [name] HUMALOG MIX 75/25 KWIKPEN (75-25) 100 UNIT/ML Kwikpen 10 Units with breakfast and 20 units with evening meal   . hydrALAZINE (APRESOLINE) 25 MG tablet Take 1 tablet (25 mg total) by mouth 3 (three) times daily.   [name] ipratropium-albuterol (DUONEB) 0.5-2.5 (3) MG/3ML SOLN Take 3 mLs by nebulization every 6 (six) hours as needed (wheezing, shortness of breath).   . isosorbide mononitrate (IMDUR) 30 MG 24 hr tablet Take 1 tablet (30 mg total) by mouth daily.   . nitroGLYCERIN (NITROLINGUAL) 0.4 MG/SPRAY spray Place 1 spray under the tongue every 5 (five) minutes x 3 doses as needed for chest pain.   [name] ondansetron (ZOFRAN) 4 MG tablet Take 4 mg by mouth every 8 (eight) hours as needed for nausea or vomiting.   . potassium chloride SA (K-DUR) 20 MEQ tablet TAKE 1 TABLET BY MOUTH DAILY   . predniSONE (DELTASONE) 10 MG tablet Take 10 mg by mouth daily with  breakfast.   . pregabalin (LYRICA) 100 MG capsule TAKE ONE CAPSULE BY MOUTH TWICE A DAY   . propranolol (INDERAL) 10 MG tablet TAKE ONE TABLET BY MOUTH THREE TIMES A DAY   . rosuvastatin (CRESTOR) 40 MG tablet Take 1 tablet (40 mg total) by mouth daily.   [name] senna (SENOKOT) 8.6 MG tablet Take 1 tablet by mouth 2 (two) times daily as needed for constipation.   . ticagrelor (BRILINTA) 60 MG TABS tablet Take 1 tablet (60 mg total) by mouth 2 (two) times daily.   . traMADol (ULTRAM) 50 MG tablet Take 1 tablet (50 mg total) by mouth 2 (two) times daily as needed. (Patient not taking: Reported on 09/27/2018) 07/05/2018: Taking 0-1 times daily     Facility-Administered Encounter Medications as of 10/28/2018  Medication  . albuterol (PROVENTIL) (5 MG/ML) 0.5% nebulizer solution 2.5 mg    Functional Status:  In your present state of health, do you have any difficulty performing the following activities: 07/30/2018 06/28/2018  Hearing? N N  Vision? N N  Difficulty concentrating or making decisions? N Y  Walking or climbing stairs? Y Y  Comment - -  Dressing or bathing? Y N  Doing errands, shopping? N Y  Comment - husband helps   Conservation officer, nature and eating ? - Y  Comment - husband helps , with meals and patient has meals on wheels   Using the Toilet? - N  In the past six months, have you accidently leaked urine? - Y  Do you have problems with loss of bowel control? - N  Managing your Medications? - Y  Comment - -  Managing your Finances? - Y  Housekeeping or managing your Housekeeping? - Y  Comment - husband helps   Some recent data might be hidden    Fall/Depression Screening: Fall Risk  06/28/2018 06/14/2018 05/27/2018  Falls in the past year? _0 Comment - - -  Number falls in past yr: 1 0 1  Injury with Fall? 0 0 1  Risk Factor Category  - - -  Risk for fall due to : Impaired balance/gait;History of fall(s) - Impaired mobility;Impaired balance/gait;History of fall(s)  Risk for fall due to: Comment - - -  Follow up - - -   PHQ 2/9 Scores 06/28/2018 05/27/2018 04/23/2018 08/24/2017 11/10/2016 07/01/2016 02/12/2016  PHQ - 2 Score _1 0 0 3  PHQ- 9 Score - - - 6 - - 7  Exception Documentation - - - - - - -    Assessment:   Plan:  Will plan return call in the next 2 week.    [name], RN, [place] Management Coordinator  [phone]- Mobile [phone]- Toll Free Main Office

## 2018-10-28 NOTE — Telephone Encounter (Signed)
Atempted to contact Pamela Collier and her husband but no answer.  Contacted her sister in law and she advised the have traveled to [place] for a few days, they will be back next week.    [place] [phone]

## 2018-11-01 ENCOUNTER — Other Ambulatory Visit: Payer: Self-pay | Admitting: Pharmacy Technician

## 2018-11-01 ENCOUNTER — Telehealth: Payer: Self-pay | Admitting: Pharmacist

## 2018-11-01 ENCOUNTER — Ambulatory Visit: Payer: Medicare Other | Admitting: Pharmacist

## 2018-11-01 MED ORDER — HUMALOG MIX 75/25 KWIKPEN (75-25) 100 UNIT/ML ~~LOC~~ SUPN: PEN_INJECTOR | SUBCUTANEOUS | 11 refills | Status: AC

## 2018-11-01 NOTE — Telephone Encounter (Signed)
Spoke with Melissa and she stated that she is going to reach out to medical records to figure out what needs to be done for the pt to be able to get all of their medical records.

## 2018-11-01 NOTE — Patient Outreach (Signed)
[facility]) Care Management  11/01/2018  Pamela Collier 11/12/1940 [id number]   Care coordination in basket message received from embedded [place] in regards to patient's application for [place] with [employer].  [place] indicated that the patient's regimen was not going to include a switch to [place] due to the patient is moving out of state and the patient assistance process would not be completed by the time the patient moves.  Will remove myself from care team and route note to [place] that patient assistance case is closed.  Edwin Baines P. Heddy Vidana, [place] Management [phone]

## 2018-11-01 NOTE — Telephone Encounter (Signed)
Please handle the request for records.

## 2018-11-01 NOTE — Telephone Encounter (Addendum)
S:     Chief Complaint  Patient presents with  . Medication Management    Diabetes Management     Patientcontacted telephonically fordiabetes evaluation, education, and management at the request ofDr. Tullo. Last seen by primary care provider on5/05/2018. Last visit with pharmacy clinic on 10/18/2018 - at that time, insulin 75/25 was continued at 10 units QAM and 20 units QPM.   Patient and son, [name], are in [place] right now and both on the phone and patient reports she is feeling better overall. Son reports that patient will be moving on July 13th to live in [place] where her son currently lives. Patient is checking her blood glucose levels twice daily and does state that she had one episode of hypoglycemia to 58. Her evening blood glucose levels prior to dinner continue to run between 170-190.   Patient's son requests that I let Dr. Derrel Nip know to prepare patient's medical records. He also requests a prescription for a Stair Lift that he is having installed in his home, and patient and her husband cannot climb stairs. They plan to find a pharmacy in [place] that offers pill packing and ask [facility] to transfer all prescriptions there. In the meantime, they will medications at [facility] through August.  Insurance coverage/medication affordability: Medicare A/B + BCBS Medicare  Patient reports adherence with medications.  Current diabetes medications include: Humalog 75/25 mix 10 units QAM, 20 units   O:   Lab Results  Component Value Date   HGBA1C 7.7 (H) 40/97/3532    Basic Metabolic Panel BMP Latest Ref Rng & Units 09/14/2018 07/31/2018 07/30/2018  Glucose 70 - 99 mg/dL 156(H) 226(H) 67(L)  BUN 6 - 23 mg/dL 24(H) 20 21  Creatinine 0.40 - 1.20 mg/dL 1.33(H) 1.17(H) 1.27(H)  BUN/Creat Ratio 12 - 28 - - -  Sodium 135 - 145 mEq/L 138 140 143  Potassium 3.5 - 5.1 mEq/L 3.9 4.2 3.1(L)  Chloride 96 - 112 mEq/L 101 107 108  CO2 19 - 32 mEq/L 28 26 28    Calcium 8.4 - 10.5 mg/dL 9.4 8.8(L) 8.9     Lipid Panel     Component Value Date/Time   CHOL 179 07/31/2018 0458   CHOL 134 03/19/2012 0024   TRIG 192 (H) 07/31/2018 0458   TRIG 110 03/19/2012 0024   HDL 49 07/31/2018 0458   HDL 41 03/19/2012 0024   CHOLHDL 3.7 07/31/2018 0458   VLDL 38 07/31/2018 0458   VLDL 22 03/19/2012 0024   LDLCALC 92 07/31/2018 0458   LDLCALC 71 03/19/2012 0024   LDLDIRECT 102.0 03/14/2016 1452    Self-Monitored Blood Glucose Results  Before Breakfast Before Supper  12-Jun 90 181  13-Jun 101 171  14-Jun 58 190  15-Jun 98 171  16-Jun 128 175  17-Jun 121 142  18-Jun 97 182  19-Jun 128       A/P: #Diabetes - Currently uncontrolled, most recent A1c 7.7% on 09/14/2018, however, more relaxed goal likely appropriate given patient comorbidities. Patient continues to have hypoglycemic events on current antihyperglycemic regemin. Patient reports adherence with medication. Could likely obtain better control with a longer acting basal insulin such as [place]. -Will hold off on switching to [place] for now as patient is moving and will not have time to obtain proof of income for prior authorization. Educated patient that this would be a better insulin for her long term and should talk to her doctor about switching after her move.  -Instructed patient to  start taking Humalog 75/25 mix 20 units in the morning and 10 units at night to help decrease hypoglycemic events in the mornings.  -Patient is moving and will need to establish care in [place].     Patient seen with Gwenlyn Found, PGY1 pharmacy resident.   Catie Darnelle Maffucci, PharmD, [place] PGY2 Ambulatory Care Pharmacy Resident, [facility] Phone: [phone]

## 2018-11-03 ENCOUNTER — Telehealth (HOSPITAL_COMMUNITY): Payer: Self-pay

## 2018-11-03 NOTE — Telephone Encounter (Signed)
Today had a telephone visit with Pamela Collier.  She stated travels went well, they stopped often and let her stretch her legs.  She states she will be moving back to [place] in July, not sure of the date.  She wants an appt with Dr Rockey Situ prior to leaving, will contact his office.  She denies chest pain, shortness of breath, headaches or dizziness.  She states her sugars have been running good.  She forgot to weight today.  She denies extremity swelling.  She states trying to active and packing.  She also has a bicycle she has been riding.  Her appetite has been good, she watches her sodium and fluids.  Will continue to visit til she moves for heart failure.   [place] [phone]

## 2018-11-04 ENCOUNTER — Ambulatory Visit: Payer: Self-pay

## 2018-11-04 NOTE — Telephone Encounter (Signed)
Patient called and says this morning she started shaking and her husband gave her Xanax. He called to ask about instructions for giving it, he's not home at the moment. I advised the patient the instructions are to give it BID prn anxiety. She says she took it around 1000 or earlier, I advised to take another one at bedtime. She verbalized understanding. I asked does she have any other concerns, she says she's feeling weak and still a little shaky, but not as bad as this morning. I asked did she check her temperature, she says they are packing up to move and she doesn't know where the thermometer is right now. I advised if she has any other questions or is not feeling better to call us back, she verbalized understanding.  Reason for Disposition . Caller has medication question only, adult not sick, and triager answers question  Protocols used: MEDICATION QUESTION CALL-A-AH

## 2018-11-10 ENCOUNTER — Telehealth: Payer: Self-pay | Admitting: Cardiovascular Disease

## 2018-11-10 NOTE — Telephone Encounter (Signed)
Please call with monitor results °

## 2018-11-10 NOTE — Telephone Encounter (Signed)
Spoke with patient and advised that I am waiting on results. She is hoping to get results and appointment before she moves out of state at the end of the month. Advised I would send to provider for results and if he wants to see her before she leaves.

## 2018-11-11 ENCOUNTER — Ambulatory Visit: Payer: Medicare Other | Admitting: *Deleted

## 2018-11-18 ENCOUNTER — Other Ambulatory Visit: Payer: Self-pay | Admitting: Internal Medicine

## 2018-11-22 ENCOUNTER — Telehealth: Payer: Self-pay | Admitting: *Deleted

## 2018-11-22 ENCOUNTER — Telehealth (HOSPITAL_COMMUNITY): Payer: Self-pay

## 2018-11-22 DIAGNOSIS — E119 Type 2 diabetes mellitus without complications: Secondary | ICD-10-CM | POA: Diagnosis not present

## 2018-11-22 DIAGNOSIS — R9431 Abnormal electrocardiogram [ECG] [EKG]: Secondary | ICD-10-CM | POA: Diagnosis not present

## 2018-11-22 DIAGNOSIS — I2541 Coronary artery aneurysm: Secondary | ICD-10-CM | POA: Diagnosis not present

## 2018-11-22 DIAGNOSIS — I10 Essential (primary) hypertension: Secondary | ICD-10-CM | POA: Diagnosis not present

## 2018-11-22 NOTE — Telephone Encounter (Signed)
Attempted to have a telephone visit but husband advised they are in [place] til Wednessday.  Will call back then.  [place] [phone]

## 2018-11-22 NOTE — Telephone Encounter (Signed)
No answer and no voice mail.

## 2018-11-22 NOTE — Telephone Encounter (Signed)
Pamela Collier called in and states that patient is in [place] and will be returning on Wednesday.

## 2018-11-22 NOTE — Telephone Encounter (Signed)
-----   Message from Minna Merritts, MD sent at 11/14/2018 10:50 AM EDT ----- Event monitor Patient triggered events were not associated with significant arrhythmia No atrial fibrillation  Incidental finding of 1 run tachycardia lasting 11 seconds, VT Asymptomatic, presented at 3 AM For that rhythm consider ischemic work-up, none has been done recently Would consider a stress test, lexiscan Myoview

## 2018-11-23 ENCOUNTER — Ambulatory Visit: Payer: Self-pay | Admitting: *Deleted

## 2018-11-23 IMAGING — MR MR CERVICAL SPINE W/O CM
5 series · 33 of 48 positions shown · non-contrast
Comparison: Cervical spine radiographs 07/01/2016 and CT 07/08/2007

CLINICAL DATA: Cervical radiculopathy. Neck pain when turning head
to the left, with pain extending into the head.

EXAM:
MRI CERVICAL SPINE WITHOUT CONTRAST
TECHNIQUE: Multiplanar, multisequence MR imaging of the cervical spine was
performed. No intravenous contrast was administered.

[Series 3: T2 · sagittal · 3.0mm · 0.70mm/px · 6 of 13 slices shown (1 of 2)]
[im 1/13]
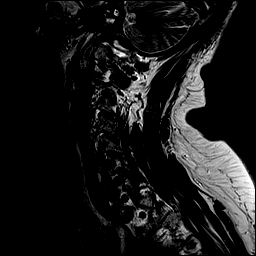
[im 3/13]
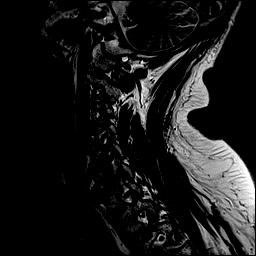
[im 5/13]
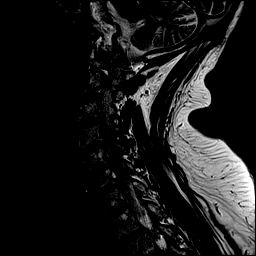
[im 8/13]
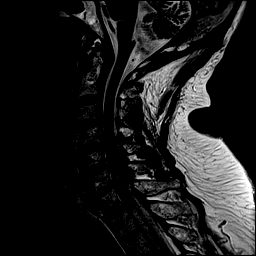
[im 10/13]
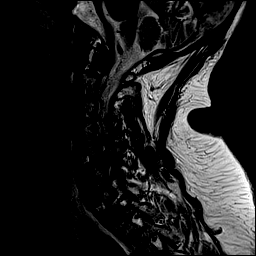
[im 13/13]
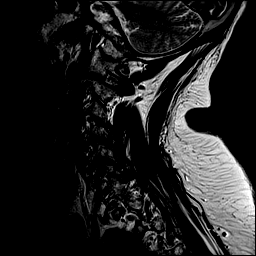

[Series 4: T1 · sagittal · 3.0mm · 0.70mm/px · 7 of 13 slices shown]
[im 1/13]
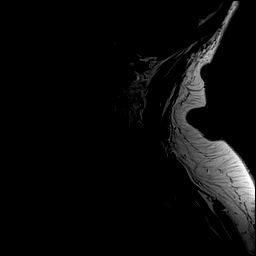
[im 3/13]
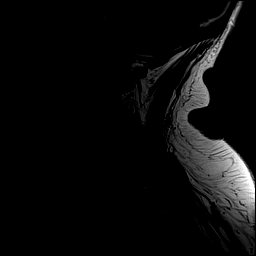
[im 5/13]
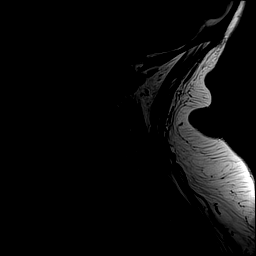
[im 7/13]
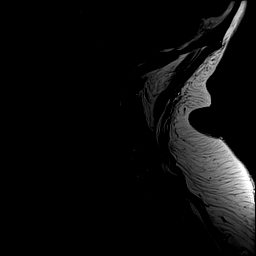
[im 9/13]
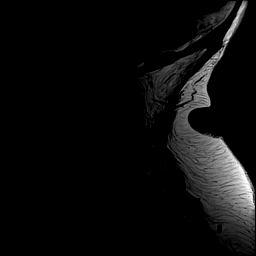
[im 11/13]
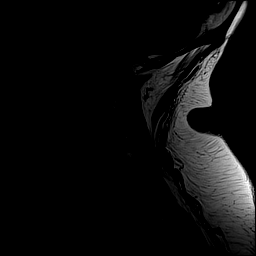
[im 13/13]
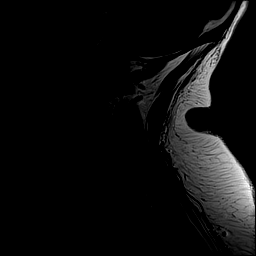

[Series 5: STIR · sagittal · 3.0mm · 0.35mm/px · 7 of 13 slices shown]
[im 1/13]
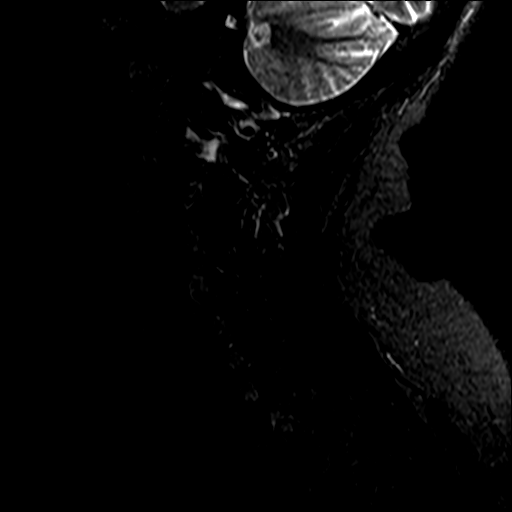
[im 3/13]
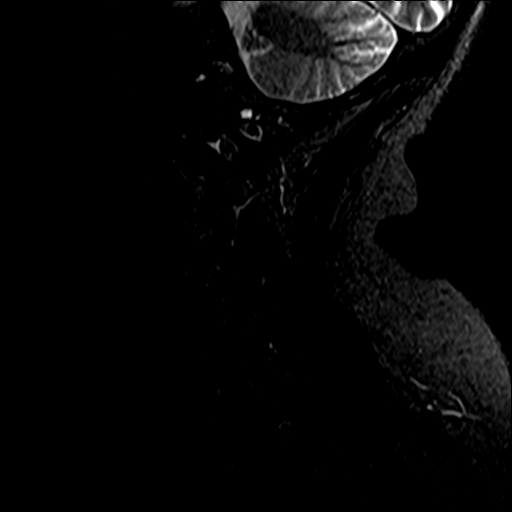
[im 5/13]
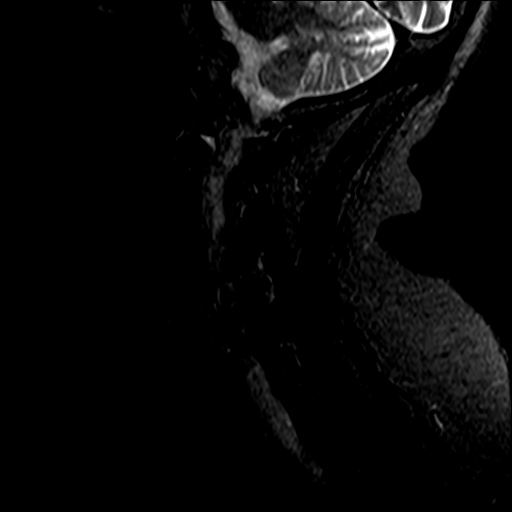
[im 7/13]
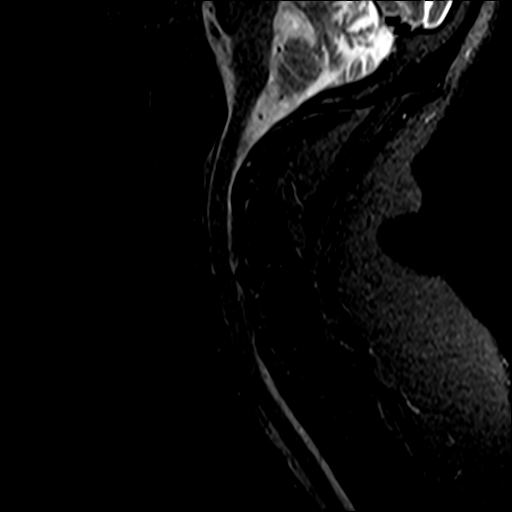
[im 9/13]
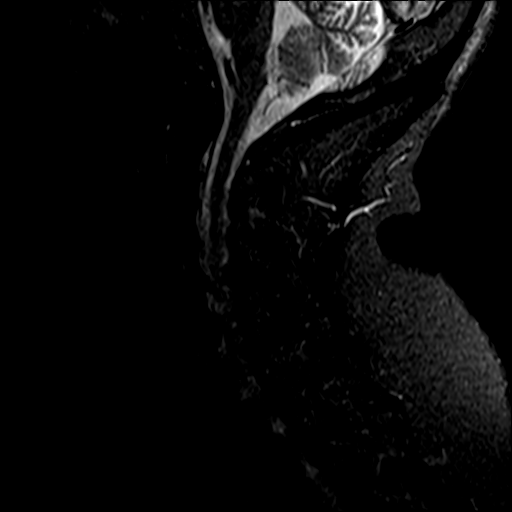
[im 11/13]
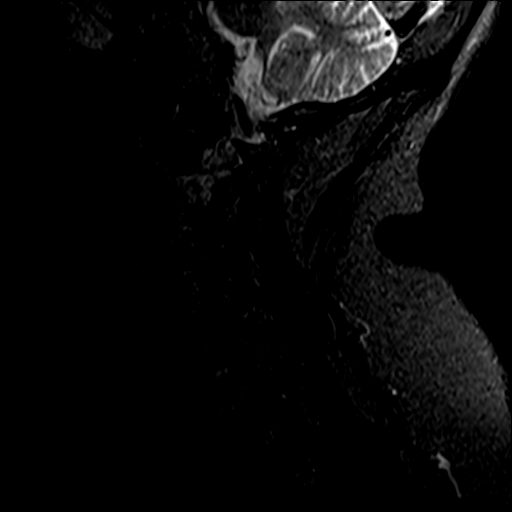
[im 13/13]
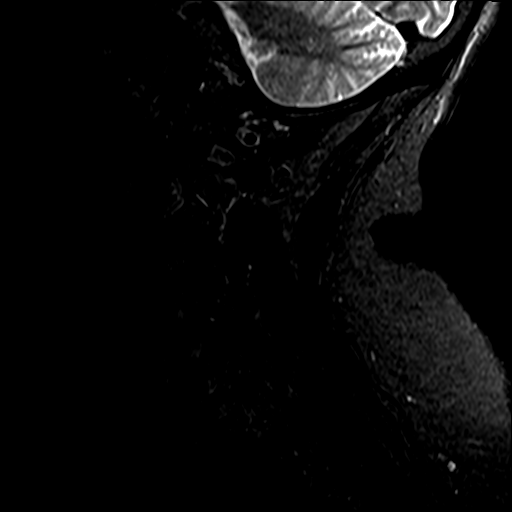

[Series 6: T2 · axial · 3.0mm · 0.70mm/px · z∈[-85,+13]mm · 8 of 27 slices shown (2 of 2)]
[im 1/27]
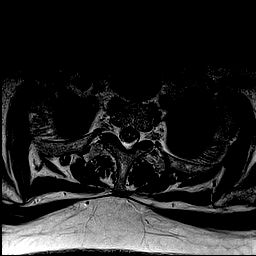
[im 5/27]
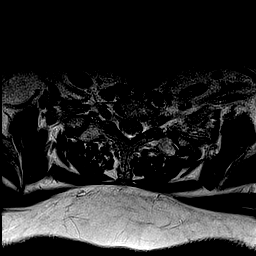
[im 9/27]
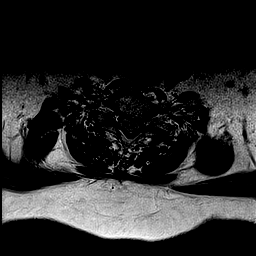
[im 13/27]
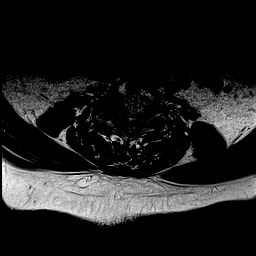
[im 15/27]
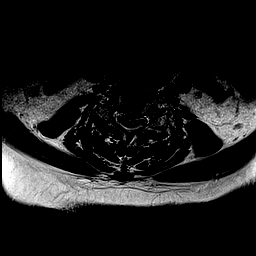
[im 19/27]
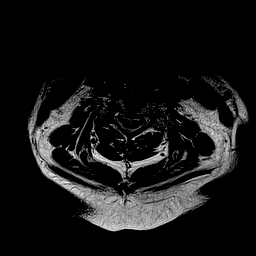
[im 23/27]
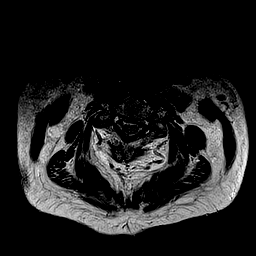
[im 27/27]
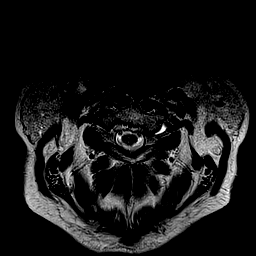

[Series 7: mpgr ax · axial · 3.0mm · 0.35mm/px · z∈[-85,-32]mm · 5 of 27 slices shown]
[im 1/27]
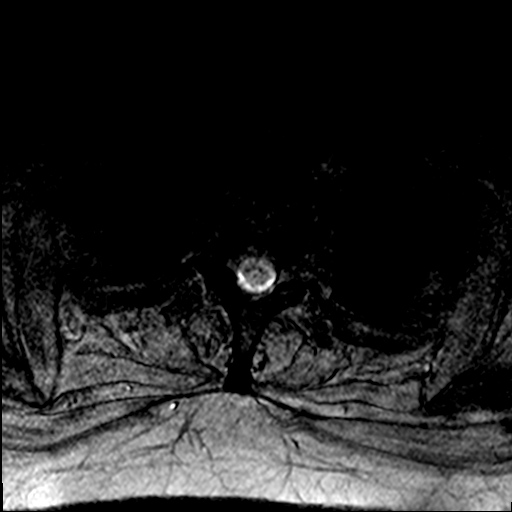
[im 5/27]
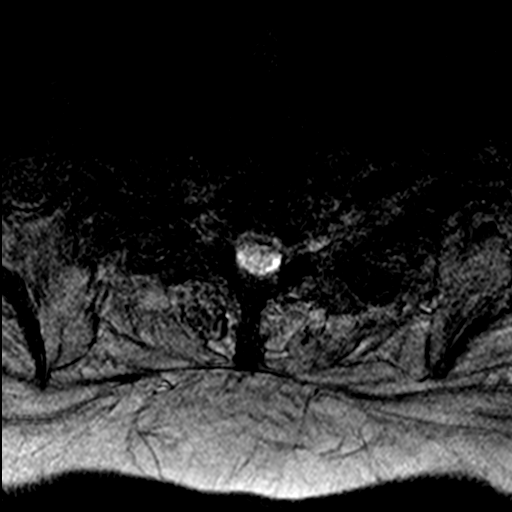
[im 9/27]
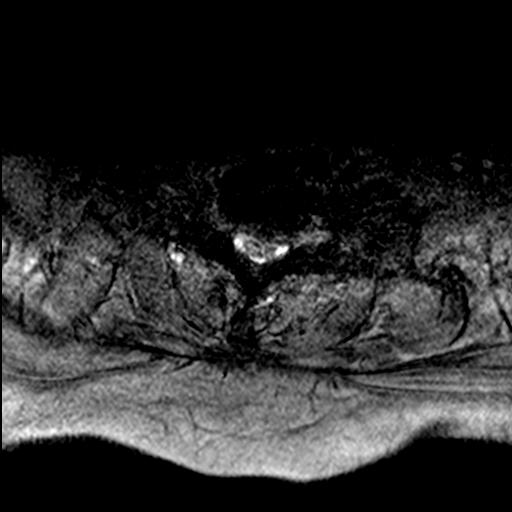
[im 13/27]
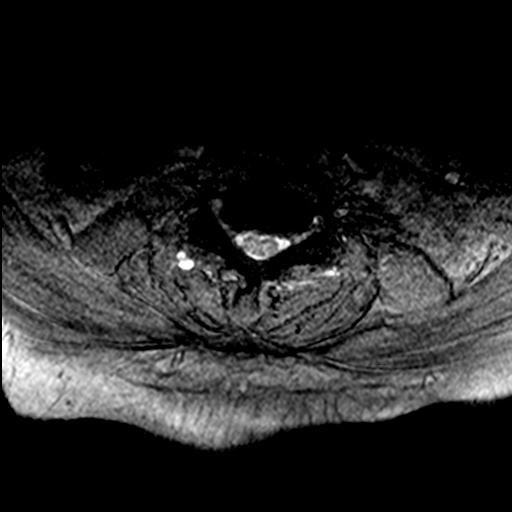
[im 15/27]
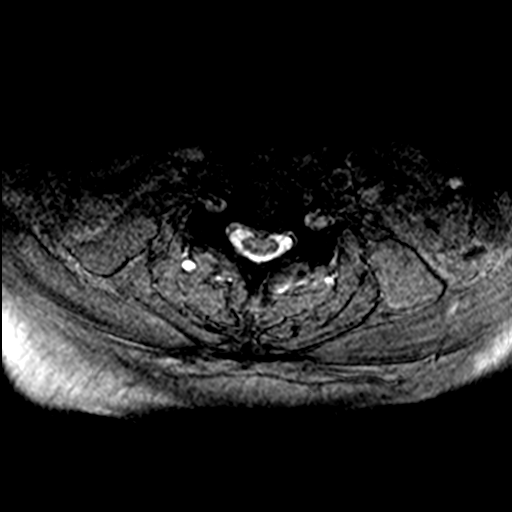

[33 of 48 positions shown; findings below may reference images not displayed]

FINDINGS: Alignment: Normal.

Vertebrae: No evidence of fracture or significant marrow edema.
Scattered small hemangiomas. Small left C1-2 joint effusion.

Cord: Normal signal.

Posterior Fossa, vertebral arteries, paraspinal tissues:
Unremarkable.

Disc levels:

C2-3:  Negative.

C3-4: Small central disc protrusion results in mild spinal stenosis
and mildly indents the ventral spinal cord. There is mild facet
arthrosis. No significant neural foraminal stenosis.

C4-5: Moderate-sized central disc protrusion and infolding of the
ligamentum flavum result in moderate spinal stenosis with mild cord
flattening. Uncovertebral spurring and facet arthrosis result in
moderate right neural foraminal stenosis.

C5-6: Mild disc space narrowing. Disc bulging, uncovertebral
spurring, and mild facet arthrosis result in moderate spinal
stenosis and severe right and moderate left neural foraminal
stenosis.

C6-7: Mild disc space narrowing. Broad-based posterior disc
osteophyte complex including more focal left central and left
foraminal disc protrusions results in moderate spinal stenosis with
slight cord flattening and moderate left neural foraminal stenosis.

C7-T1:  Negative.
IMPRESSION: 1. Multilevel cervical disc degeneration with moderate spinal
stenosis at C4-5, C5-6, and C6-7.
2. Severe right neural foraminal stenosis at C5-6.
3. Moderate neural foraminal stenosis on the left at C5-6 and C6-7.

## 2018-11-25 ENCOUNTER — Other Ambulatory Visit: Payer: Self-pay | Admitting: Internal Medicine

## 2018-11-25 DIAGNOSIS — N3941 Urge incontinence: Secondary | ICD-10-CM | POA: Diagnosis not present

## 2018-11-25 DIAGNOSIS — M5416 Radiculopathy, lumbar region: Secondary | ICD-10-CM | POA: Diagnosis not present

## 2018-11-25 DIAGNOSIS — I5033 Acute on chronic diastolic (congestive) heart failure: Secondary | ICD-10-CM | POA: Diagnosis present

## 2018-11-25 DIAGNOSIS — G8929 Other chronic pain: Secondary | ICD-10-CM | POA: Diagnosis present

## 2018-11-25 DIAGNOSIS — J811 Chronic pulmonary edema: Secondary | ICD-10-CM | POA: Diagnosis not present

## 2018-11-25 DIAGNOSIS — E876 Hypokalemia: Secondary | ICD-10-CM | POA: Diagnosis not present

## 2018-11-25 DIAGNOSIS — M109 Gout, unspecified: Secondary | ICD-10-CM | POA: Diagnosis present

## 2018-11-25 DIAGNOSIS — B370 Candidal stomatitis: Secondary | ICD-10-CM | POA: Diagnosis not present

## 2018-11-25 DIAGNOSIS — S32059A Unspecified fracture of fifth lumbar vertebra, initial encounter for closed fracture: Secondary | ICD-10-CM | POA: Diagnosis not present

## 2018-11-25 DIAGNOSIS — K5900 Constipation, unspecified: Secondary | ICD-10-CM | POA: Diagnosis not present

## 2018-11-25 DIAGNOSIS — J918 Pleural effusion in other conditions classified elsewhere: Secondary | ICD-10-CM | POA: Diagnosis not present

## 2018-11-25 DIAGNOSIS — I110 Hypertensive heart disease with heart failure: Secondary | ICD-10-CM | POA: Diagnosis not present

## 2018-11-25 DIAGNOSIS — N390 Urinary tract infection, site not specified: Secondary | ICD-10-CM | POA: Diagnosis present

## 2018-11-25 DIAGNOSIS — K2970 Gastritis, unspecified, without bleeding: Secondary | ICD-10-CM | POA: Diagnosis not present

## 2018-11-25 DIAGNOSIS — T82857A Stenosis of cardiac prosthetic devices, implants and grafts, initial encounter: Secondary | ICD-10-CM | POA: Diagnosis present

## 2018-11-25 DIAGNOSIS — J449 Chronic obstructive pulmonary disease, unspecified: Secondary | ICD-10-CM | POA: Diagnosis not present

## 2018-11-25 DIAGNOSIS — R413 Other amnesia: Secondary | ICD-10-CM | POA: Diagnosis present

## 2018-11-25 DIAGNOSIS — M545 Low back pain: Secondary | ICD-10-CM | POA: Diagnosis present

## 2018-11-25 DIAGNOSIS — M549 Dorsalgia, unspecified: Secondary | ICD-10-CM | POA: Diagnosis not present

## 2018-11-25 DIAGNOSIS — R0902 Hypoxemia: Secondary | ICD-10-CM | POA: Diagnosis not present

## 2018-11-25 DIAGNOSIS — G40909 Epilepsy, unspecified, not intractable, without status epilepticus: Secondary | ICD-10-CM | POA: Diagnosis not present

## 2018-11-25 DIAGNOSIS — D500 Iron deficiency anemia secondary to blood loss (chronic): Secondary | ICD-10-CM | POA: Diagnosis not present

## 2018-11-25 DIAGNOSIS — N182 Chronic kidney disease, stage 2 (mild): Secondary | ICD-10-CM | POA: Diagnosis present

## 2018-11-25 DIAGNOSIS — E1165 Type 2 diabetes mellitus with hyperglycemia: Secondary | ICD-10-CM | POA: Diagnosis not present

## 2018-11-25 DIAGNOSIS — Z9989 Dependence on other enabling machines and devices: Secondary | ICD-10-CM | POA: Diagnosis not present

## 2018-11-25 DIAGNOSIS — R079 Chest pain, unspecified: Secondary | ICD-10-CM | POA: Diagnosis not present

## 2018-11-25 DIAGNOSIS — K210 Gastro-esophageal reflux disease with esophagitis: Secondary | ICD-10-CM | POA: Diagnosis not present

## 2018-11-25 DIAGNOSIS — M48061 Spinal stenosis, lumbar region without neurogenic claudication: Secondary | ICD-10-CM | POA: Diagnosis not present

## 2018-11-25 DIAGNOSIS — R110 Nausea: Secondary | ICD-10-CM | POA: Diagnosis not present

## 2018-11-25 DIAGNOSIS — G252 Other specified forms of tremor: Secondary | ICD-10-CM | POA: Diagnosis present

## 2018-11-25 DIAGNOSIS — J9621 Acute and chronic respiratory failure with hypoxia: Secondary | ICD-10-CM | POA: Diagnosis not present

## 2018-11-25 DIAGNOSIS — Z7401 Bed confinement status: Secondary | ICD-10-CM | POA: Diagnosis not present

## 2018-11-25 DIAGNOSIS — Z20828 Contact with and (suspected) exposure to other viral communicable diseases: Secondary | ICD-10-CM | POA: Diagnosis present

## 2018-11-25 DIAGNOSIS — R339 Retention of urine, unspecified: Secondary | ICD-10-CM | POA: Diagnosis not present

## 2018-11-25 DIAGNOSIS — J440 Chronic obstructive pulmonary disease with acute lower respiratory infection: Secondary | ICD-10-CM | POA: Diagnosis present

## 2018-11-25 DIAGNOSIS — Z955 Presence of coronary angioplasty implant and graft: Secondary | ICD-10-CM | POA: Diagnosis not present

## 2018-11-25 DIAGNOSIS — I10 Essential (primary) hypertension: Secondary | ICD-10-CM | POA: Diagnosis not present

## 2018-11-25 DIAGNOSIS — I2510 Atherosclerotic heart disease of native coronary artery without angina pectoris: Secondary | ICD-10-CM | POA: Diagnosis not present

## 2018-11-25 DIAGNOSIS — R569 Unspecified convulsions: Secondary | ICD-10-CM | POA: Diagnosis not present

## 2018-11-25 DIAGNOSIS — J189 Pneumonia, unspecified organism: Secondary | ICD-10-CM | POA: Diagnosis not present

## 2018-11-25 DIAGNOSIS — R1013 Epigastric pain: Secondary | ICD-10-CM | POA: Diagnosis not present

## 2018-11-25 DIAGNOSIS — I509 Heart failure, unspecified: Secondary | ICD-10-CM | POA: Diagnosis not present

## 2018-11-25 DIAGNOSIS — E119 Type 2 diabetes mellitus without complications: Secondary | ICD-10-CM | POA: Diagnosis not present

## 2018-11-25 DIAGNOSIS — R279 Unspecified lack of coordination: Secondary | ICD-10-CM | POA: Diagnosis present

## 2018-11-25 DIAGNOSIS — I130 Hypertensive heart and chronic kidney disease with heart failure and stage 1 through stage 4 chronic kidney disease, or unspecified chronic kidney disease: Secondary | ICD-10-CM | POA: Diagnosis not present

## 2018-11-25 DIAGNOSIS — R109 Unspecified abdominal pain: Secondary | ICD-10-CM | POA: Diagnosis not present

## 2018-11-25 DIAGNOSIS — R1032 Left lower quadrant pain: Secondary | ICD-10-CM | POA: Diagnosis not present

## 2018-11-25 DIAGNOSIS — N3090 Cystitis, unspecified without hematuria: Secondary | ICD-10-CM | POA: Diagnosis not present

## 2018-11-25 DIAGNOSIS — R0600 Dyspnea, unspecified: Secondary | ICD-10-CM | POA: Diagnosis not present

## 2018-11-25 DIAGNOSIS — D649 Anemia, unspecified: Secondary | ICD-10-CM | POA: Diagnosis not present

## 2018-11-25 DIAGNOSIS — F419 Anxiety disorder, unspecified: Secondary | ICD-10-CM | POA: Diagnosis present

## 2018-11-25 DIAGNOSIS — E1142 Type 2 diabetes mellitus with diabetic polyneuropathy: Secondary | ICD-10-CM | POA: Diagnosis present

## 2018-11-25 DIAGNOSIS — I25110 Atherosclerotic heart disease of native coronary artery with unstable angina pectoris: Secondary | ICD-10-CM | POA: Diagnosis not present

## 2018-11-25 DIAGNOSIS — R140 Abdominal distension (gaseous): Secondary | ICD-10-CM | POA: Diagnosis not present

## 2018-11-25 DIAGNOSIS — I119 Hypertensive heart disease without heart failure: Secondary | ICD-10-CM | POA: Diagnosis not present

## 2018-11-25 DIAGNOSIS — R102 Pelvic and perineal pain: Secondary | ICD-10-CM | POA: Diagnosis not present

## 2018-11-25 DIAGNOSIS — R9431 Abnormal electrocardiogram [ECG] [EKG]: Secondary | ICD-10-CM | POA: Diagnosis not present

## 2018-11-25 DIAGNOSIS — E785 Hyperlipidemia, unspecified: Secondary | ICD-10-CM | POA: Diagnosis present

## 2018-11-25 DIAGNOSIS — D72829 Elevated white blood cell count, unspecified: Secondary | ICD-10-CM | POA: Diagnosis not present

## 2018-11-25 DIAGNOSIS — M6281 Muscle weakness (generalized): Secondary | ICD-10-CM | POA: Diagnosis not present

## 2018-11-25 DIAGNOSIS — R0602 Shortness of breath: Secondary | ICD-10-CM | POA: Diagnosis not present

## 2018-11-25 DIAGNOSIS — J90 Pleural effusion, not elsewhere classified: Secondary | ICD-10-CM | POA: Diagnosis not present

## 2018-11-25 DIAGNOSIS — K3189 Other diseases of stomach and duodenum: Secondary | ICD-10-CM | POA: Diagnosis not present

## 2018-11-25 DIAGNOSIS — Z9181 History of falling: Secondary | ICD-10-CM | POA: Diagnosis not present

## 2018-11-25 DIAGNOSIS — R1010 Upper abdominal pain, unspecified: Secondary | ICD-10-CM | POA: Diagnosis not present

## 2018-11-25 DIAGNOSIS — R262 Difficulty in walking, not elsewhere classified: Secondary | ICD-10-CM | POA: Diagnosis present

## 2018-11-25 DIAGNOSIS — R4182 Altered mental status, unspecified: Secondary | ICD-10-CM | POA: Diagnosis not present

## 2018-11-25 DIAGNOSIS — Z741 Need for assistance with personal care: Secondary | ICD-10-CM | POA: Diagnosis present

## 2018-11-25 DIAGNOSIS — R700 Elevated erythrocyte sedimentation rate: Secondary | ICD-10-CM | POA: Diagnosis not present

## 2018-11-26 ENCOUNTER — Other Ambulatory Visit: Payer: Self-pay | Admitting: *Deleted

## 2018-11-26 NOTE — Patient Outreach (Signed)
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 [facility] care management involvement for complex care management .   PMHX; includes but not limited to, Chronic Diastolic heart failure , OSA on CPAP, Diabetes, vascular dementia , diabetic neuropathy, CVA.   Outreach call to patient home number no answer, unable to leave a voicemail message.  Noted outreach from [place], [place], Paramedicine on 7/13 to spouse that reports patient out of town to return on Wednesday, 7/15,  Placed call to patient spouse Pamela Collier successful , HIPAA verified by 2 identifiers, he states that they have already moved to [place]. He discussed patient is doing okay she is just resting because they have traveled up to [place] twice this month. He reports that they are now settled in at their son home . He discussed Dr.Tullo has transferred all the record to her doctor in [place] , he is unable to state the name. He states patient has all of medications as pharmacy has transferred information .  Reviewed reinforced continued management skills  of chronic condition heart failure and diabetes, he discussed that they have more support for both of their health now.   Discussed case closure as patient care will be managed outside [facility] care management service areas he voiced understanding .   Plan  Will close complex care management case, will send PCP and Dr.Gollan  case closure letter.In basket message to Jule Ser, RN.  Will notify Dorene Ar, EMT of patient completed plan move to [place] for this month.    [name], RN, [place] Management Coordinator  [phone]- Mobile [phone]- Toll Free Main Office

## 2018-11-26 NOTE — Telephone Encounter (Signed)
Attempted to call the patient. No answer- no voice mail- message was asking for a "remote access code."  We will try back next week.

## 2018-11-30 NOTE — Telephone Encounter (Signed)
-----   Message from Alfonzo Feller, RN sent at 11/26/2018 12:34 PM EDT ----- Regarding: Patient has moved out of state Hi Pamela Collier I noted that you had attempted to outreach patient in the last week. I spoke with her spouse on today, he reports that they have permanently moved to [place] as was their goal for  this month.  He reports patient being established with a PCP and cardiology he was not able to recall the names, reporting Dr.Tullo has sent her record information .   Thank you  [name], RN, [place] Management Coordinator  [phone]- Mobile [phone]- Toll Free Main Office

## 2018-11-30 NOTE — Telephone Encounter (Signed)
Spoke with patients spouse per release form. He states that she is currently in the hospital there in [place] where they moved recently due to elevated blood pressures. Reviewed monitor results with him and he reports they are doing stress test today at hospital there in [place]. Let him know that if he should have any questions or need any information to please let us know. He was appreciative for the call and follow up. Provided emotional support for her to recover soon.

## 2018-12-01 ENCOUNTER — Telehealth (HOSPITAL_COMMUNITY): Payer: Self-pay | Admitting: Licensed Clinical Social Worker

## 2018-12-01 NOTE — Telephone Encounter (Signed)
Attempted to call to completed [employer] exit interview with patient.  Was able to reach patients spouse who states they just moved to [place] last week and patient actually had to be admitted to the hospital due to over exertion- states she will go to rehab center next.  CSW will plan to follow up at later date when pt is hopefully out of rehab to discuss [employer] program  Jorge Ny, [place] Worker [facility] Desk#: [phone] Cell#: [phone]

## 2018-12-06 ENCOUNTER — Ambulatory Visit: Payer: Medicare Other | Admitting: Family

## 2018-12-08 ENCOUNTER — Telehealth (HOSPITAL_COMMUNITY): Payer: Self-pay

## 2018-12-08 NOTE — Telephone Encounter (Signed)
Spoke with Pamela Collier husband [name] and he advised they have moved and she is in the hospital.  She will be having a steent placed today.  He states she doing pretty good.  Wished him and her the best. Will discharge her from my program.   [place] [phone]

## 2018-12-21 DIAGNOSIS — R279 Unspecified lack of coordination: Secondary | ICD-10-CM | POA: Diagnosis present

## 2018-12-21 DIAGNOSIS — I700 Atherosclerosis of aorta: Secondary | ICD-10-CM | POA: Diagnosis not present

## 2018-12-21 DIAGNOSIS — Z741 Need for assistance with personal care: Secondary | ICD-10-CM | POA: Diagnosis present

## 2018-12-21 DIAGNOSIS — J9621 Acute and chronic respiratory failure with hypoxia: Secondary | ICD-10-CM | POA: Diagnosis present

## 2018-12-21 DIAGNOSIS — Z7401 Bed confinement status: Secondary | ICD-10-CM | POA: Diagnosis not present

## 2018-12-21 DIAGNOSIS — R1110 Vomiting, unspecified: Secondary | ICD-10-CM | POA: Diagnosis not present

## 2018-12-21 DIAGNOSIS — M6281 Muscle weakness (generalized): Secondary | ICD-10-CM | POA: Diagnosis present

## 2018-12-21 DIAGNOSIS — I10 Essential (primary) hypertension: Secondary | ICD-10-CM | POA: Diagnosis not present

## 2018-12-21 DIAGNOSIS — R531 Weakness: Secondary | ICD-10-CM | POA: Diagnosis not present

## 2018-12-21 DIAGNOSIS — K5730 Diverticulosis of large intestine without perforation or abscess without bleeding: Secondary | ICD-10-CM | POA: Diagnosis not present

## 2018-12-21 DIAGNOSIS — G250 Essential tremor: Secondary | ICD-10-CM | POA: Diagnosis not present

## 2018-12-21 DIAGNOSIS — R2681 Unsteadiness on feet: Secondary | ICD-10-CM | POA: Diagnosis not present

## 2018-12-21 DIAGNOSIS — R9431 Abnormal electrocardiogram [ECG] [EKG]: Secondary | ICD-10-CM | POA: Diagnosis not present

## 2018-12-21 DIAGNOSIS — I5031 Acute diastolic (congestive) heart failure: Secondary | ICD-10-CM | POA: Diagnosis not present

## 2018-12-21 DIAGNOSIS — J90 Pleural effusion, not elsewhere classified: Secondary | ICD-10-CM | POA: Diagnosis not present

## 2018-12-21 DIAGNOSIS — Z1159 Encounter for screening for other viral diseases: Secondary | ICD-10-CM | POA: Diagnosis not present

## 2018-12-21 DIAGNOSIS — F4323 Adjustment disorder with mixed anxiety and depressed mood: Secondary | ICD-10-CM | POA: Diagnosis not present

## 2018-12-21 DIAGNOSIS — I509 Heart failure, unspecified: Secondary | ICD-10-CM | POA: Diagnosis present

## 2018-12-21 DIAGNOSIS — F419 Anxiety disorder, unspecified: Secondary | ICD-10-CM | POA: Diagnosis present

## 2018-12-21 DIAGNOSIS — M545 Low back pain: Secondary | ICD-10-CM | POA: Diagnosis not present

## 2018-12-21 DIAGNOSIS — J449 Chronic obstructive pulmonary disease, unspecified: Secondary | ICD-10-CM | POA: Diagnosis present

## 2018-12-21 DIAGNOSIS — K5792 Diverticulitis of intestine, part unspecified, without perforation or abscess without bleeding: Secondary | ICD-10-CM | POA: Diagnosis not present

## 2018-12-21 DIAGNOSIS — D649 Anemia, unspecified: Secondary | ICD-10-CM | POA: Diagnosis not present

## 2018-12-21 DIAGNOSIS — R0602 Shortness of breath: Secondary | ICD-10-CM | POA: Diagnosis not present

## 2018-12-21 DIAGNOSIS — R001 Bradycardia, unspecified: Secondary | ICD-10-CM | POA: Diagnosis not present

## 2018-12-21 DIAGNOSIS — I5032 Chronic diastolic (congestive) heart failure: Secondary | ICD-10-CM | POA: Diagnosis not present

## 2018-12-21 DIAGNOSIS — G252 Other specified forms of tremor: Secondary | ICD-10-CM | POA: Diagnosis present

## 2018-12-21 DIAGNOSIS — R112 Nausea with vomiting, unspecified: Secondary | ICD-10-CM | POA: Diagnosis not present

## 2018-12-21 DIAGNOSIS — J189 Pneumonia, unspecified organism: Secondary | ICD-10-CM | POA: Diagnosis not present

## 2018-12-21 DIAGNOSIS — R262 Difficulty in walking, not elsewhere classified: Secondary | ICD-10-CM | POA: Diagnosis not present

## 2018-12-21 DIAGNOSIS — S32050A Wedge compression fracture of fifth lumbar vertebra, initial encounter for closed fracture: Secondary | ICD-10-CM | POA: Diagnosis not present

## 2018-12-21 DIAGNOSIS — E119 Type 2 diabetes mellitus without complications: Secondary | ICD-10-CM | POA: Diagnosis present

## 2018-12-21 DIAGNOSIS — K449 Diaphragmatic hernia without obstruction or gangrene: Secondary | ICD-10-CM | POA: Diagnosis not present

## 2018-12-21 DIAGNOSIS — R251 Tremor, unspecified: Secondary | ICD-10-CM | POA: Diagnosis not present

## 2018-12-21 DIAGNOSIS — E1165 Type 2 diabetes mellitus with hyperglycemia: Secondary | ICD-10-CM | POA: Diagnosis not present

## 2018-12-21 DIAGNOSIS — M48061 Spinal stenosis, lumbar region without neurogenic claudication: Secondary | ICD-10-CM | POA: Diagnosis not present

## 2018-12-21 DIAGNOSIS — R1030 Lower abdominal pain, unspecified: Secondary | ICD-10-CM | POA: Diagnosis not present

## 2018-12-21 DIAGNOSIS — K429 Umbilical hernia without obstruction or gangrene: Secondary | ICD-10-CM | POA: Diagnosis not present

## 2018-12-21 DIAGNOSIS — R700 Elevated erythrocyte sedimentation rate: Secondary | ICD-10-CM | POA: Diagnosis not present

## 2018-12-21 DIAGNOSIS — J918 Pleural effusion in other conditions classified elsewhere: Secondary | ICD-10-CM | POA: Diagnosis not present

## 2018-12-21 DIAGNOSIS — I25118 Atherosclerotic heart disease of native coronary artery with other forms of angina pectoris: Secondary | ICD-10-CM | POA: Diagnosis not present

## 2018-12-21 DIAGNOSIS — Z9181 History of falling: Secondary | ICD-10-CM | POA: Diagnosis not present

## 2018-12-21 DIAGNOSIS — N390 Urinary tract infection, site not specified: Secondary | ICD-10-CM | POA: Diagnosis not present

## 2018-12-21 DIAGNOSIS — R0902 Hypoxemia: Secondary | ICD-10-CM | POA: Diagnosis not present

## 2018-12-21 DIAGNOSIS — I119 Hypertensive heart disease without heart failure: Secondary | ICD-10-CM | POA: Diagnosis not present

## 2018-12-21 DIAGNOSIS — I2510 Atherosclerotic heart disease of native coronary artery without angina pectoris: Secondary | ICD-10-CM | POA: Diagnosis not present

## 2018-12-21 DIAGNOSIS — S32058A Other fracture of fifth lumbar vertebra, initial encounter for closed fracture: Secondary | ICD-10-CM | POA: Diagnosis not present

## 2018-12-22 DIAGNOSIS — M545 Low back pain: Secondary | ICD-10-CM | POA: Diagnosis not present

## 2018-12-22 DIAGNOSIS — M6281 Muscle weakness (generalized): Secondary | ICD-10-CM | POA: Diagnosis not present

## 2018-12-22 DIAGNOSIS — I25118 Atherosclerotic heart disease of native coronary artery with other forms of angina pectoris: Secondary | ICD-10-CM | POA: Diagnosis not present

## 2018-12-22 DIAGNOSIS — R2681 Unsteadiness on feet: Secondary | ICD-10-CM | POA: Diagnosis not present

## 2018-12-22 DIAGNOSIS — I2510 Atherosclerotic heart disease of native coronary artery without angina pectoris: Secondary | ICD-10-CM | POA: Diagnosis not present

## 2018-12-22 DIAGNOSIS — N390 Urinary tract infection, site not specified: Secondary | ICD-10-CM | POA: Diagnosis not present

## 2018-12-22 DIAGNOSIS — J9621 Acute and chronic respiratory failure with hypoxia: Secondary | ICD-10-CM | POA: Diagnosis not present

## 2018-12-22 DIAGNOSIS — R531 Weakness: Secondary | ICD-10-CM | POA: Diagnosis not present

## 2018-12-22 DIAGNOSIS — J918 Pleural effusion in other conditions classified elsewhere: Secondary | ICD-10-CM | POA: Diagnosis not present

## 2018-12-22 DIAGNOSIS — J449 Chronic obstructive pulmonary disease, unspecified: Secondary | ICD-10-CM | POA: Diagnosis not present

## 2018-12-22 DIAGNOSIS — I5032 Chronic diastolic (congestive) heart failure: Secondary | ICD-10-CM | POA: Diagnosis not present

## 2018-12-22 DIAGNOSIS — I10 Essential (primary) hypertension: Secondary | ICD-10-CM | POA: Diagnosis not present

## 2018-12-22 DIAGNOSIS — R262 Difficulty in walking, not elsewhere classified: Secondary | ICD-10-CM | POA: Diagnosis not present

## 2018-12-22 DIAGNOSIS — I509 Heart failure, unspecified: Secondary | ICD-10-CM | POA: Diagnosis not present

## 2018-12-24 DIAGNOSIS — I25118 Atherosclerotic heart disease of native coronary artery with other forms of angina pectoris: Secondary | ICD-10-CM | POA: Diagnosis not present

## 2018-12-24 DIAGNOSIS — I5032 Chronic diastolic (congestive) heart failure: Secondary | ICD-10-CM | POA: Diagnosis not present

## 2018-12-24 DIAGNOSIS — I10 Essential (primary) hypertension: Secondary | ICD-10-CM | POA: Diagnosis not present

## 2018-12-24 DIAGNOSIS — R531 Weakness: Secondary | ICD-10-CM | POA: Diagnosis not present

## 2018-12-24 DIAGNOSIS — R2681 Unsteadiness on feet: Secondary | ICD-10-CM | POA: Diagnosis not present

## 2018-12-27 DIAGNOSIS — I5032 Chronic diastolic (congestive) heart failure: Secondary | ICD-10-CM | POA: Diagnosis not present

## 2018-12-27 DIAGNOSIS — I10 Essential (primary) hypertension: Secondary | ICD-10-CM | POA: Diagnosis not present

## 2018-12-27 DIAGNOSIS — R2681 Unsteadiness on feet: Secondary | ICD-10-CM | POA: Diagnosis not present

## 2018-12-27 DIAGNOSIS — I25118 Atherosclerotic heart disease of native coronary artery with other forms of angina pectoris: Secondary | ICD-10-CM | POA: Diagnosis not present

## 2018-12-29 DIAGNOSIS — I2510 Atherosclerotic heart disease of native coronary artery without angina pectoris: Secondary | ICD-10-CM | POA: Diagnosis not present

## 2018-12-29 DIAGNOSIS — I25118 Atherosclerotic heart disease of native coronary artery with other forms of angina pectoris: Secondary | ICD-10-CM | POA: Diagnosis not present

## 2018-12-29 DIAGNOSIS — J449 Chronic obstructive pulmonary disease, unspecified: Secondary | ICD-10-CM | POA: Diagnosis not present

## 2018-12-29 DIAGNOSIS — I509 Heart failure, unspecified: Secondary | ICD-10-CM | POA: Diagnosis not present

## 2018-12-29 DIAGNOSIS — M545 Low back pain: Secondary | ICD-10-CM | POA: Diagnosis not present

## 2018-12-29 DIAGNOSIS — J9621 Acute and chronic respiratory failure with hypoxia: Secondary | ICD-10-CM | POA: Diagnosis not present

## 2018-12-29 DIAGNOSIS — I10 Essential (primary) hypertension: Secondary | ICD-10-CM | POA: Diagnosis not present

## 2018-12-29 DIAGNOSIS — R531 Weakness: Secondary | ICD-10-CM | POA: Diagnosis not present

## 2018-12-29 DIAGNOSIS — M6281 Muscle weakness (generalized): Secondary | ICD-10-CM | POA: Diagnosis not present

## 2018-12-29 DIAGNOSIS — J918 Pleural effusion in other conditions classified elsewhere: Secondary | ICD-10-CM | POA: Diagnosis not present

## 2018-12-29 DIAGNOSIS — R262 Difficulty in walking, not elsewhere classified: Secondary | ICD-10-CM | POA: Diagnosis not present

## 2018-12-29 DIAGNOSIS — I5032 Chronic diastolic (congestive) heart failure: Secondary | ICD-10-CM | POA: Diagnosis not present

## 2018-12-31 DIAGNOSIS — I25118 Atherosclerotic heart disease of native coronary artery with other forms of angina pectoris: Secondary | ICD-10-CM | POA: Diagnosis not present

## 2018-12-31 DIAGNOSIS — R251 Tremor, unspecified: Secondary | ICD-10-CM | POA: Diagnosis not present

## 2018-12-31 DIAGNOSIS — R2681 Unsteadiness on feet: Secondary | ICD-10-CM | POA: Diagnosis not present

## 2018-12-31 DIAGNOSIS — I10 Essential (primary) hypertension: Secondary | ICD-10-CM | POA: Diagnosis not present

## 2018-12-31 DIAGNOSIS — I5032 Chronic diastolic (congestive) heart failure: Secondary | ICD-10-CM | POA: Diagnosis not present

## 2019-01-03 DIAGNOSIS — I5032 Chronic diastolic (congestive) heart failure: Secondary | ICD-10-CM | POA: Diagnosis not present

## 2019-01-03 DIAGNOSIS — I25118 Atherosclerotic heart disease of native coronary artery with other forms of angina pectoris: Secondary | ICD-10-CM | POA: Diagnosis not present

## 2019-01-03 DIAGNOSIS — G250 Essential tremor: Secondary | ICD-10-CM | POA: Diagnosis not present

## 2019-01-03 DIAGNOSIS — I10 Essential (primary) hypertension: Secondary | ICD-10-CM | POA: Diagnosis not present

## 2019-01-04 DIAGNOSIS — R262 Difficulty in walking, not elsewhere classified: Secondary | ICD-10-CM | POA: Diagnosis not present

## 2019-01-04 DIAGNOSIS — I10 Essential (primary) hypertension: Secondary | ICD-10-CM | POA: Diagnosis not present

## 2019-01-04 DIAGNOSIS — J918 Pleural effusion in other conditions classified elsewhere: Secondary | ICD-10-CM | POA: Diagnosis not present

## 2019-01-04 DIAGNOSIS — J9621 Acute and chronic respiratory failure with hypoxia: Secondary | ICD-10-CM | POA: Diagnosis not present

## 2019-01-04 DIAGNOSIS — I509 Heart failure, unspecified: Secondary | ICD-10-CM | POA: Diagnosis not present

## 2019-01-04 DIAGNOSIS — M545 Low back pain: Secondary | ICD-10-CM | POA: Diagnosis not present

## 2019-01-04 DIAGNOSIS — I2510 Atherosclerotic heart disease of native coronary artery without angina pectoris: Secondary | ICD-10-CM | POA: Diagnosis not present

## 2019-01-04 DIAGNOSIS — J449 Chronic obstructive pulmonary disease, unspecified: Secondary | ICD-10-CM | POA: Diagnosis not present

## 2019-01-04 DIAGNOSIS — M6281 Muscle weakness (generalized): Secondary | ICD-10-CM | POA: Diagnosis not present

## 2019-01-05 DIAGNOSIS — G250 Essential tremor: Secondary | ICD-10-CM | POA: Diagnosis not present

## 2019-01-05 DIAGNOSIS — I10 Essential (primary) hypertension: Secondary | ICD-10-CM | POA: Diagnosis not present

## 2019-01-05 DIAGNOSIS — I25118 Atherosclerotic heart disease of native coronary artery with other forms of angina pectoris: Secondary | ICD-10-CM | POA: Diagnosis not present

## 2019-01-05 DIAGNOSIS — I5032 Chronic diastolic (congestive) heart failure: Secondary | ICD-10-CM | POA: Diagnosis not present

## 2019-01-07 DIAGNOSIS — J918 Pleural effusion in other conditions classified elsewhere: Secondary | ICD-10-CM | POA: Diagnosis not present

## 2019-01-07 DIAGNOSIS — I25118 Atherosclerotic heart disease of native coronary artery with other forms of angina pectoris: Secondary | ICD-10-CM | POA: Diagnosis not present

## 2019-01-07 DIAGNOSIS — I5032 Chronic diastolic (congestive) heart failure: Secondary | ICD-10-CM | POA: Diagnosis not present

## 2019-01-07 DIAGNOSIS — M6281 Muscle weakness (generalized): Secondary | ICD-10-CM | POA: Diagnosis not present

## 2019-01-07 DIAGNOSIS — R262 Difficulty in walking, not elsewhere classified: Secondary | ICD-10-CM | POA: Diagnosis not present

## 2019-01-07 DIAGNOSIS — I2510 Atherosclerotic heart disease of native coronary artery without angina pectoris: Secondary | ICD-10-CM | POA: Diagnosis not present

## 2019-01-07 DIAGNOSIS — J9621 Acute and chronic respiratory failure with hypoxia: Secondary | ICD-10-CM | POA: Diagnosis not present

## 2019-01-07 DIAGNOSIS — R1110 Vomiting, unspecified: Secondary | ICD-10-CM | POA: Diagnosis not present

## 2019-01-07 DIAGNOSIS — J449 Chronic obstructive pulmonary disease, unspecified: Secondary | ICD-10-CM | POA: Diagnosis not present

## 2019-01-07 DIAGNOSIS — G250 Essential tremor: Secondary | ICD-10-CM | POA: Diagnosis not present

## 2019-01-07 DIAGNOSIS — I509 Heart failure, unspecified: Secondary | ICD-10-CM | POA: Diagnosis not present

## 2019-01-07 DIAGNOSIS — I10 Essential (primary) hypertension: Secondary | ICD-10-CM | POA: Diagnosis not present

## 2019-01-07 DIAGNOSIS — R112 Nausea with vomiting, unspecified: Secondary | ICD-10-CM | POA: Diagnosis not present

## 2019-01-10 DIAGNOSIS — G250 Essential tremor: Secondary | ICD-10-CM | POA: Diagnosis not present

## 2019-01-10 DIAGNOSIS — I25118 Atherosclerotic heart disease of native coronary artery with other forms of angina pectoris: Secondary | ICD-10-CM | POA: Diagnosis not present

## 2019-01-10 DIAGNOSIS — I5032 Chronic diastolic (congestive) heart failure: Secondary | ICD-10-CM | POA: Diagnosis not present

## 2019-01-10 DIAGNOSIS — R2681 Unsteadiness on feet: Secondary | ICD-10-CM | POA: Diagnosis not present

## 2019-01-11 DIAGNOSIS — J9621 Acute and chronic respiratory failure with hypoxia: Secondary | ICD-10-CM | POA: Diagnosis not present

## 2019-01-11 DIAGNOSIS — J449 Chronic obstructive pulmonary disease, unspecified: Secondary | ICD-10-CM | POA: Diagnosis not present

## 2019-01-11 DIAGNOSIS — R112 Nausea with vomiting, unspecified: Secondary | ICD-10-CM | POA: Diagnosis not present

## 2019-01-11 DIAGNOSIS — I2510 Atherosclerotic heart disease of native coronary artery without angina pectoris: Secondary | ICD-10-CM | POA: Diagnosis not present

## 2019-01-11 DIAGNOSIS — I10 Essential (primary) hypertension: Secondary | ICD-10-CM | POA: Diagnosis not present

## 2019-01-11 DIAGNOSIS — I509 Heart failure, unspecified: Secondary | ICD-10-CM | POA: Diagnosis not present

## 2019-01-11 DIAGNOSIS — J918 Pleural effusion in other conditions classified elsewhere: Secondary | ICD-10-CM | POA: Diagnosis not present

## 2019-01-11 DIAGNOSIS — M6281 Muscle weakness (generalized): Secondary | ICD-10-CM | POA: Diagnosis not present

## 2019-01-11 DIAGNOSIS — R262 Difficulty in walking, not elsewhere classified: Secondary | ICD-10-CM | POA: Diagnosis not present

## 2019-01-12 DIAGNOSIS — I25118 Atherosclerotic heart disease of native coronary artery with other forms of angina pectoris: Secondary | ICD-10-CM | POA: Diagnosis not present

## 2019-01-12 DIAGNOSIS — R1030 Lower abdominal pain, unspecified: Secondary | ICD-10-CM | POA: Diagnosis not present

## 2019-01-12 DIAGNOSIS — I5032 Chronic diastolic (congestive) heart failure: Secondary | ICD-10-CM | POA: Diagnosis not present

## 2019-01-12 DIAGNOSIS — G250 Essential tremor: Secondary | ICD-10-CM | POA: Diagnosis not present

## 2019-01-14 DIAGNOSIS — I700 Atherosclerosis of aorta: Secondary | ICD-10-CM | POA: Diagnosis not present

## 2019-01-14 DIAGNOSIS — K449 Diaphragmatic hernia without obstruction or gangrene: Secondary | ICD-10-CM | POA: Diagnosis not present

## 2019-01-14 DIAGNOSIS — G250 Essential tremor: Secondary | ICD-10-CM | POA: Diagnosis not present

## 2019-01-14 DIAGNOSIS — K5730 Diverticulosis of large intestine without perforation or abscess without bleeding: Secondary | ICD-10-CM | POA: Diagnosis not present

## 2019-01-14 DIAGNOSIS — M48061 Spinal stenosis, lumbar region without neurogenic claudication: Secondary | ICD-10-CM | POA: Diagnosis not present

## 2019-01-14 DIAGNOSIS — I5032 Chronic diastolic (congestive) heart failure: Secondary | ICD-10-CM | POA: Diagnosis not present

## 2019-01-14 DIAGNOSIS — I25118 Atherosclerotic heart disease of native coronary artery with other forms of angina pectoris: Secondary | ICD-10-CM | POA: Diagnosis not present

## 2019-01-14 DIAGNOSIS — K429 Umbilical hernia without obstruction or gangrene: Secondary | ICD-10-CM | POA: Diagnosis not present

## 2019-01-14 DIAGNOSIS — S32058A Other fracture of fifth lumbar vertebra, initial encounter for closed fracture: Secondary | ICD-10-CM | POA: Diagnosis not present

## 2019-01-14 DIAGNOSIS — R2681 Unsteadiness on feet: Secondary | ICD-10-CM | POA: Diagnosis not present

## 2019-01-15 DIAGNOSIS — R112 Nausea with vomiting, unspecified: Secondary | ICD-10-CM | POA: Diagnosis not present

## 2019-01-15 DIAGNOSIS — I509 Heart failure, unspecified: Secondary | ICD-10-CM | POA: Diagnosis not present

## 2019-01-15 DIAGNOSIS — J918 Pleural effusion in other conditions classified elsewhere: Secondary | ICD-10-CM | POA: Diagnosis not present

## 2019-01-15 DIAGNOSIS — J449 Chronic obstructive pulmonary disease, unspecified: Secondary | ICD-10-CM | POA: Diagnosis not present

## 2019-01-15 DIAGNOSIS — J9621 Acute and chronic respiratory failure with hypoxia: Secondary | ICD-10-CM | POA: Diagnosis not present

## 2019-01-15 DIAGNOSIS — I10 Essential (primary) hypertension: Secondary | ICD-10-CM | POA: Diagnosis not present

## 2019-01-15 DIAGNOSIS — R1110 Vomiting, unspecified: Secondary | ICD-10-CM | POA: Diagnosis not present

## 2019-01-15 DIAGNOSIS — R262 Difficulty in walking, not elsewhere classified: Secondary | ICD-10-CM | POA: Diagnosis not present

## 2019-01-15 DIAGNOSIS — M6281 Muscle weakness (generalized): Secondary | ICD-10-CM | POA: Diagnosis not present

## 2019-01-15 DIAGNOSIS — I2510 Atherosclerotic heart disease of native coronary artery without angina pectoris: Secondary | ICD-10-CM | POA: Diagnosis not present

## 2019-01-17 DIAGNOSIS — K5792 Diverticulitis of intestine, part unspecified, without perforation or abscess without bleeding: Secondary | ICD-10-CM | POA: Diagnosis not present

## 2019-01-17 DIAGNOSIS — I5032 Chronic diastolic (congestive) heart failure: Secondary | ICD-10-CM | POA: Diagnosis not present

## 2019-01-17 DIAGNOSIS — I25118 Atherosclerotic heart disease of native coronary artery with other forms of angina pectoris: Secondary | ICD-10-CM | POA: Diagnosis not present

## 2019-01-17 DIAGNOSIS — I10 Essential (primary) hypertension: Secondary | ICD-10-CM | POA: Diagnosis not present

## 2019-01-17 DIAGNOSIS — S32050A Wedge compression fracture of fifth lumbar vertebra, initial encounter for closed fracture: Secondary | ICD-10-CM | POA: Diagnosis not present

## 2019-01-17 DIAGNOSIS — G250 Essential tremor: Secondary | ICD-10-CM | POA: Diagnosis not present

## 2019-01-17 DIAGNOSIS — R2681 Unsteadiness on feet: Secondary | ICD-10-CM | POA: Diagnosis not present

## 2019-01-19 DIAGNOSIS — R2681 Unsteadiness on feet: Secondary | ICD-10-CM | POA: Diagnosis not present

## 2019-01-19 DIAGNOSIS — K5792 Diverticulitis of intestine, part unspecified, without perforation or abscess without bleeding: Secondary | ICD-10-CM | POA: Diagnosis not present

## 2019-01-19 DIAGNOSIS — S32050A Wedge compression fracture of fifth lumbar vertebra, initial encounter for closed fracture: Secondary | ICD-10-CM | POA: Diagnosis not present

## 2019-01-19 DIAGNOSIS — G250 Essential tremor: Secondary | ICD-10-CM | POA: Diagnosis not present

## 2019-01-19 DIAGNOSIS — I10 Essential (primary) hypertension: Secondary | ICD-10-CM | POA: Diagnosis not present

## 2019-01-19 DIAGNOSIS — I5032 Chronic diastolic (congestive) heart failure: Secondary | ICD-10-CM | POA: Diagnosis not present

## 2019-01-19 DIAGNOSIS — I25118 Atherosclerotic heart disease of native coronary artery with other forms of angina pectoris: Secondary | ICD-10-CM | POA: Diagnosis not present

## 2019-01-20 DIAGNOSIS — I10 Essential (primary) hypertension: Secondary | ICD-10-CM | POA: Diagnosis not present

## 2019-01-20 DIAGNOSIS — J449 Chronic obstructive pulmonary disease, unspecified: Secondary | ICD-10-CM | POA: Diagnosis not present

## 2019-01-20 DIAGNOSIS — I2510 Atherosclerotic heart disease of native coronary artery without angina pectoris: Secondary | ICD-10-CM | POA: Diagnosis not present

## 2019-01-20 DIAGNOSIS — J918 Pleural effusion in other conditions classified elsewhere: Secondary | ICD-10-CM | POA: Diagnosis not present

## 2019-01-20 DIAGNOSIS — R262 Difficulty in walking, not elsewhere classified: Secondary | ICD-10-CM | POA: Diagnosis not present

## 2019-01-20 DIAGNOSIS — I509 Heart failure, unspecified: Secondary | ICD-10-CM | POA: Diagnosis not present

## 2019-01-20 DIAGNOSIS — J9621 Acute and chronic respiratory failure with hypoxia: Secondary | ICD-10-CM | POA: Diagnosis not present

## 2019-01-20 DIAGNOSIS — M6281 Muscle weakness (generalized): Secondary | ICD-10-CM | POA: Diagnosis not present

## 2019-01-21 DIAGNOSIS — G250 Essential tremor: Secondary | ICD-10-CM | POA: Diagnosis not present

## 2019-01-21 DIAGNOSIS — I10 Essential (primary) hypertension: Secondary | ICD-10-CM | POA: Diagnosis not present

## 2019-01-21 DIAGNOSIS — I25118 Atherosclerotic heart disease of native coronary artery with other forms of angina pectoris: Secondary | ICD-10-CM | POA: Diagnosis not present

## 2019-01-21 DIAGNOSIS — I5032 Chronic diastolic (congestive) heart failure: Secondary | ICD-10-CM | POA: Diagnosis not present

## 2019-01-21 DIAGNOSIS — S32050A Wedge compression fracture of fifth lumbar vertebra, initial encounter for closed fracture: Secondary | ICD-10-CM | POA: Diagnosis not present

## 2019-01-21 DIAGNOSIS — R2681 Unsteadiness on feet: Secondary | ICD-10-CM | POA: Diagnosis not present

## 2019-01-21 DIAGNOSIS — K5792 Diverticulitis of intestine, part unspecified, without perforation or abscess without bleeding: Secondary | ICD-10-CM | POA: Diagnosis not present

## 2019-01-24 DIAGNOSIS — I5032 Chronic diastolic (congestive) heart failure: Secondary | ICD-10-CM | POA: Diagnosis not present

## 2019-01-24 DIAGNOSIS — K5792 Diverticulitis of intestine, part unspecified, without perforation or abscess without bleeding: Secondary | ICD-10-CM | POA: Diagnosis not present

## 2019-01-24 DIAGNOSIS — I25118 Atherosclerotic heart disease of native coronary artery with other forms of angina pectoris: Secondary | ICD-10-CM | POA: Diagnosis not present

## 2019-01-24 DIAGNOSIS — R1030 Lower abdominal pain, unspecified: Secondary | ICD-10-CM | POA: Diagnosis not present

## 2019-01-24 DIAGNOSIS — S32050A Wedge compression fracture of fifth lumbar vertebra, initial encounter for closed fracture: Secondary | ICD-10-CM | POA: Diagnosis not present

## 2019-01-24 DIAGNOSIS — I10 Essential (primary) hypertension: Secondary | ICD-10-CM | POA: Diagnosis not present

## 2019-01-25 DIAGNOSIS — M545 Low back pain: Secondary | ICD-10-CM | POA: Diagnosis not present

## 2019-01-25 DIAGNOSIS — M6281 Muscle weakness (generalized): Secondary | ICD-10-CM | POA: Diagnosis not present

## 2019-01-25 DIAGNOSIS — I2510 Atherosclerotic heart disease of native coronary artery without angina pectoris: Secondary | ICD-10-CM | POA: Diagnosis not present

## 2019-01-25 DIAGNOSIS — J918 Pleural effusion in other conditions classified elsewhere: Secondary | ICD-10-CM | POA: Diagnosis not present

## 2019-01-25 DIAGNOSIS — R1110 Vomiting, unspecified: Secondary | ICD-10-CM | POA: Diagnosis not present

## 2019-01-25 DIAGNOSIS — I10 Essential (primary) hypertension: Secondary | ICD-10-CM | POA: Diagnosis not present

## 2019-01-25 DIAGNOSIS — R262 Difficulty in walking, not elsewhere classified: Secondary | ICD-10-CM | POA: Diagnosis not present

## 2019-01-25 DIAGNOSIS — R112 Nausea with vomiting, unspecified: Secondary | ICD-10-CM | POA: Diagnosis not present

## 2019-01-25 DIAGNOSIS — I509 Heart failure, unspecified: Secondary | ICD-10-CM | POA: Diagnosis not present

## 2019-01-25 DIAGNOSIS — J449 Chronic obstructive pulmonary disease, unspecified: Secondary | ICD-10-CM | POA: Diagnosis not present

## 2019-01-25 DIAGNOSIS — J9621 Acute and chronic respiratory failure with hypoxia: Secondary | ICD-10-CM | POA: Diagnosis not present

## 2019-01-26 DIAGNOSIS — K5792 Diverticulitis of intestine, part unspecified, without perforation or abscess without bleeding: Secondary | ICD-10-CM | POA: Diagnosis not present

## 2019-01-26 DIAGNOSIS — I25118 Atherosclerotic heart disease of native coronary artery with other forms of angina pectoris: Secondary | ICD-10-CM | POA: Diagnosis not present

## 2019-01-26 DIAGNOSIS — S32050A Wedge compression fracture of fifth lumbar vertebra, initial encounter for closed fracture: Secondary | ICD-10-CM | POA: Diagnosis not present

## 2019-01-26 DIAGNOSIS — R1030 Lower abdominal pain, unspecified: Secondary | ICD-10-CM | POA: Diagnosis not present

## 2019-01-26 DIAGNOSIS — I10 Essential (primary) hypertension: Secondary | ICD-10-CM | POA: Diagnosis not present

## 2019-01-26 DIAGNOSIS — I5032 Chronic diastolic (congestive) heart failure: Secondary | ICD-10-CM | POA: Diagnosis not present

## 2019-01-28 DIAGNOSIS — I10 Essential (primary) hypertension: Secondary | ICD-10-CM | POA: Diagnosis not present

## 2019-01-28 DIAGNOSIS — I25118 Atherosclerotic heart disease of native coronary artery with other forms of angina pectoris: Secondary | ICD-10-CM | POA: Diagnosis not present

## 2019-01-28 DIAGNOSIS — I5032 Chronic diastolic (congestive) heart failure: Secondary | ICD-10-CM | POA: Diagnosis not present

## 2019-01-28 DIAGNOSIS — R1030 Lower abdominal pain, unspecified: Secondary | ICD-10-CM | POA: Diagnosis not present

## 2019-01-28 DIAGNOSIS — S32050A Wedge compression fracture of fifth lumbar vertebra, initial encounter for closed fracture: Secondary | ICD-10-CM | POA: Diagnosis not present

## 2019-01-31 DIAGNOSIS — R1030 Lower abdominal pain, unspecified: Secondary | ICD-10-CM | POA: Diagnosis not present

## 2019-01-31 DIAGNOSIS — I10 Essential (primary) hypertension: Secondary | ICD-10-CM | POA: Diagnosis not present

## 2019-01-31 DIAGNOSIS — I5032 Chronic diastolic (congestive) heart failure: Secondary | ICD-10-CM | POA: Diagnosis not present

## 2019-01-31 DIAGNOSIS — S32050A Wedge compression fracture of fifth lumbar vertebra, initial encounter for closed fracture: Secondary | ICD-10-CM | POA: Diagnosis not present

## 2019-01-31 DIAGNOSIS — I25118 Atherosclerotic heart disease of native coronary artery with other forms of angina pectoris: Secondary | ICD-10-CM | POA: Diagnosis not present

## 2019-02-02 DIAGNOSIS — R262 Difficulty in walking, not elsewhere classified: Secondary | ICD-10-CM | POA: Diagnosis not present

## 2019-02-02 DIAGNOSIS — J449 Chronic obstructive pulmonary disease, unspecified: Secondary | ICD-10-CM | POA: Diagnosis not present

## 2019-02-02 DIAGNOSIS — I25118 Atherosclerotic heart disease of native coronary artery with other forms of angina pectoris: Secondary | ICD-10-CM | POA: Diagnosis not present

## 2019-02-02 DIAGNOSIS — S32050A Wedge compression fracture of fifth lumbar vertebra, initial encounter for closed fracture: Secondary | ICD-10-CM | POA: Diagnosis not present

## 2019-02-02 DIAGNOSIS — J9621 Acute and chronic respiratory failure with hypoxia: Secondary | ICD-10-CM | POA: Diagnosis not present

## 2019-02-02 DIAGNOSIS — I10 Essential (primary) hypertension: Secondary | ICD-10-CM | POA: Diagnosis not present

## 2019-02-02 DIAGNOSIS — R001 Bradycardia, unspecified: Secondary | ICD-10-CM | POA: Diagnosis not present

## 2019-02-02 DIAGNOSIS — M545 Low back pain: Secondary | ICD-10-CM | POA: Diagnosis not present

## 2019-02-02 DIAGNOSIS — R1030 Lower abdominal pain, unspecified: Secondary | ICD-10-CM | POA: Diagnosis not present

## 2019-02-02 DIAGNOSIS — I5032 Chronic diastolic (congestive) heart failure: Secondary | ICD-10-CM | POA: Diagnosis not present

## 2019-02-02 DIAGNOSIS — J918 Pleural effusion in other conditions classified elsewhere: Secondary | ICD-10-CM | POA: Diagnosis not present

## 2019-02-02 DIAGNOSIS — R9431 Abnormal electrocardiogram [ECG] [EKG]: Secondary | ICD-10-CM | POA: Diagnosis not present

## 2019-02-02 DIAGNOSIS — I5031 Acute diastolic (congestive) heart failure: Secondary | ICD-10-CM | POA: Diagnosis not present

## 2019-02-02 DIAGNOSIS — I509 Heart failure, unspecified: Secondary | ICD-10-CM | POA: Diagnosis not present

## 2019-02-02 DIAGNOSIS — M6281 Muscle weakness (generalized): Secondary | ICD-10-CM | POA: Diagnosis not present

## 2019-02-02 DIAGNOSIS — R112 Nausea with vomiting, unspecified: Secondary | ICD-10-CM | POA: Diagnosis not present

## 2019-02-02 DIAGNOSIS — I2510 Atherosclerotic heart disease of native coronary artery without angina pectoris: Secondary | ICD-10-CM | POA: Diagnosis not present

## 2019-02-02 IMAGING — RF DG C-ARM 61-120 MIN
1 series · 2 of 2 positions shown · non-contrast
Comparison: Radiographs July 01, 2016.

CLINICAL DATA: Anterior cervical fusion.

EXAM:
CERVICAL SPINE 1 VIEW
FLUOROSCOPY TIME:  10 seconds.

[Series 1: run · 2 of 2 slices shown]
[im 1/2]
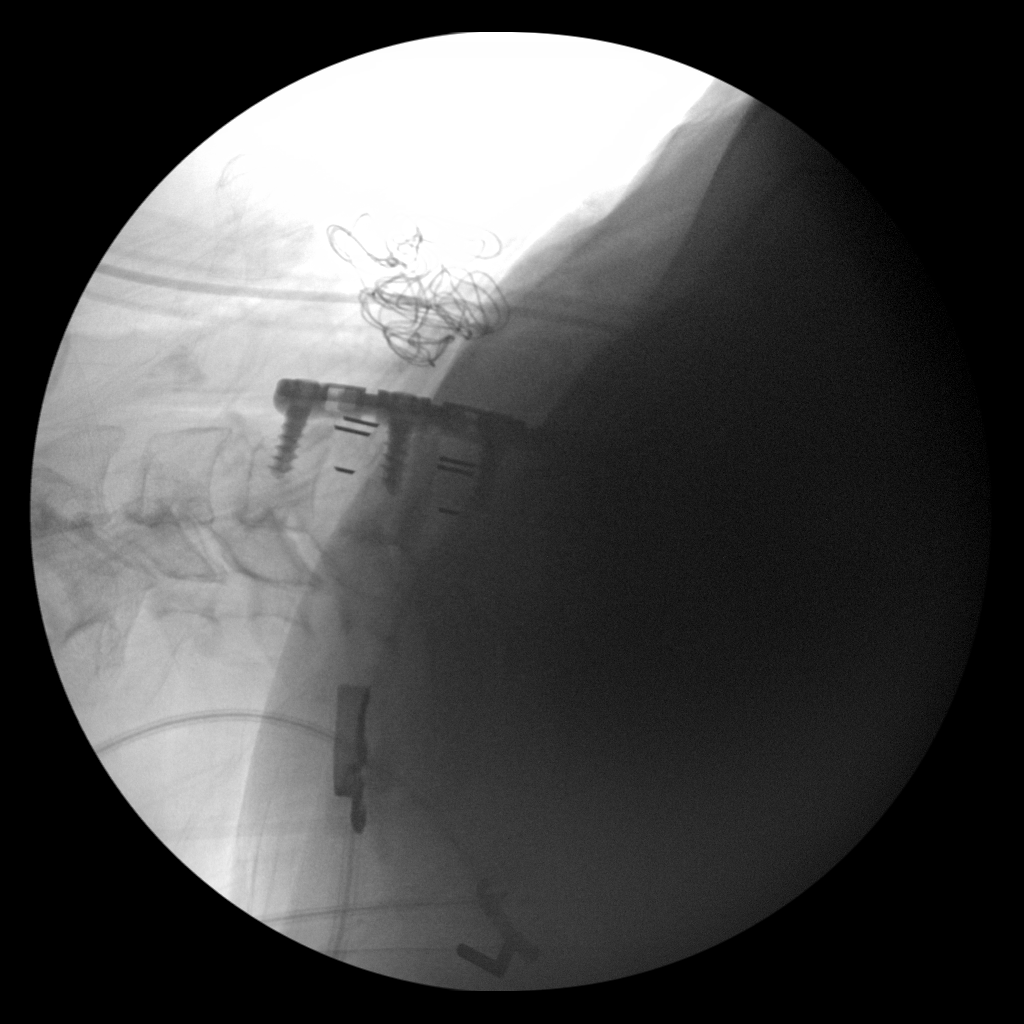
[im 2/2]
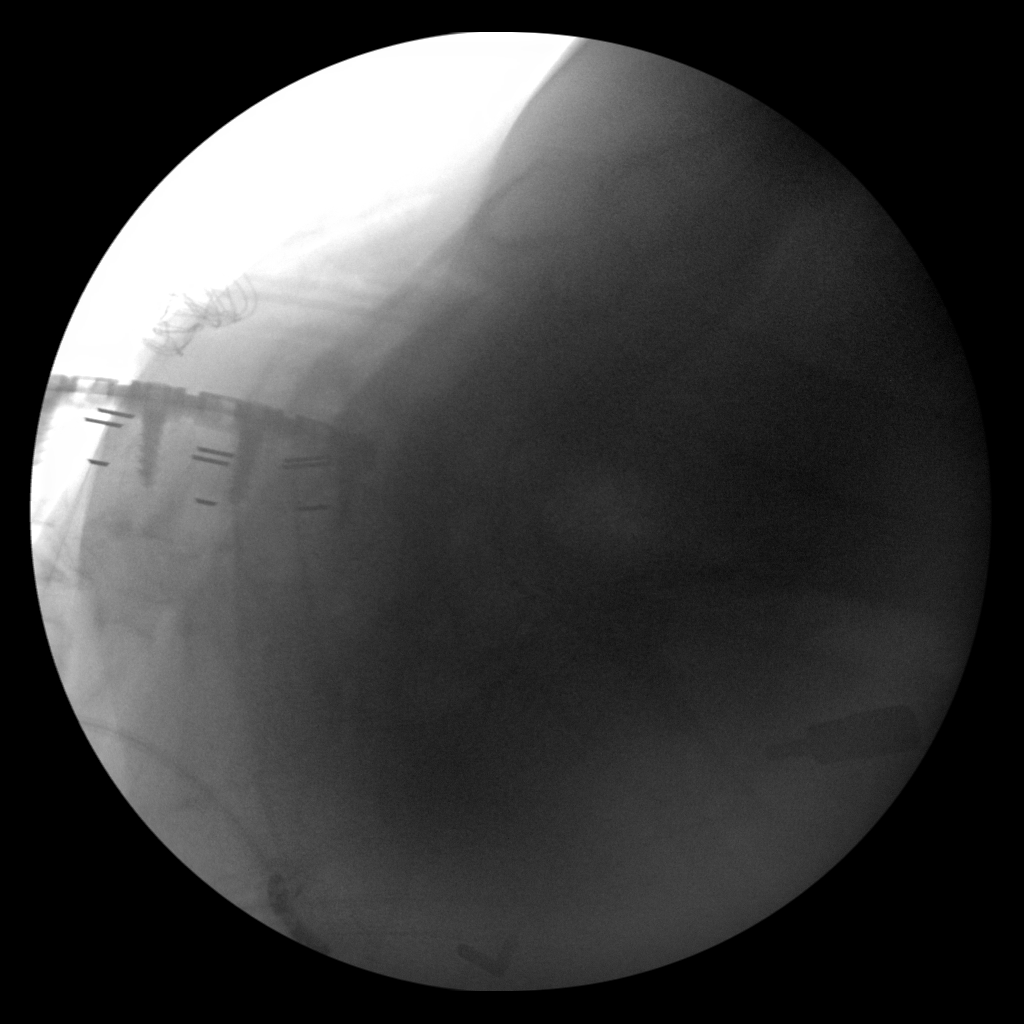

[2 of 2 positions shown; findings below may reference images not displayed]

FINDINGS: Two intraoperative fluoroscopic images demonstrate the patient be
status post surgical anterior fusion extending from C4-C7. Good
alignment of vertebral bodies is noted.
IMPRESSION: Status post surgical anterior fusion from C4-C7

## 2019-02-03 DIAGNOSIS — S32050A Wedge compression fracture of fifth lumbar vertebra, initial encounter for closed fracture: Secondary | ICD-10-CM | POA: Diagnosis not present

## 2019-02-03 DIAGNOSIS — I10 Essential (primary) hypertension: Secondary | ICD-10-CM | POA: Diagnosis not present

## 2019-02-03 DIAGNOSIS — I5032 Chronic diastolic (congestive) heart failure: Secondary | ICD-10-CM | POA: Diagnosis not present

## 2019-02-03 DIAGNOSIS — I25118 Atherosclerotic heart disease of native coronary artery with other forms of angina pectoris: Secondary | ICD-10-CM | POA: Diagnosis not present

## 2019-02-03 DIAGNOSIS — R1030 Lower abdominal pain, unspecified: Secondary | ICD-10-CM | POA: Diagnosis not present

## 2019-02-04 DIAGNOSIS — J449 Chronic obstructive pulmonary disease, unspecified: Secondary | ICD-10-CM | POA: Diagnosis not present

## 2019-02-04 DIAGNOSIS — R262 Difficulty in walking, not elsewhere classified: Secondary | ICD-10-CM | POA: Diagnosis not present

## 2019-02-04 DIAGNOSIS — R112 Nausea with vomiting, unspecified: Secondary | ICD-10-CM | POA: Diagnosis not present

## 2019-02-04 DIAGNOSIS — M6281 Muscle weakness (generalized): Secondary | ICD-10-CM | POA: Diagnosis not present

## 2019-02-04 DIAGNOSIS — I2510 Atherosclerotic heart disease of native coronary artery without angina pectoris: Secondary | ICD-10-CM | POA: Diagnosis not present

## 2019-02-04 DIAGNOSIS — J9621 Acute and chronic respiratory failure with hypoxia: Secondary | ICD-10-CM | POA: Diagnosis not present

## 2019-02-04 DIAGNOSIS — I509 Heart failure, unspecified: Secondary | ICD-10-CM | POA: Diagnosis not present

## 2019-02-04 DIAGNOSIS — I10 Essential (primary) hypertension: Secondary | ICD-10-CM | POA: Diagnosis not present

## 2019-02-07 DIAGNOSIS — I10 Essential (primary) hypertension: Secondary | ICD-10-CM | POA: Diagnosis not present

## 2019-02-07 DIAGNOSIS — S32050A Wedge compression fracture of fifth lumbar vertebra, initial encounter for closed fracture: Secondary | ICD-10-CM | POA: Diagnosis not present

## 2019-02-07 DIAGNOSIS — I25118 Atherosclerotic heart disease of native coronary artery with other forms of angina pectoris: Secondary | ICD-10-CM | POA: Diagnosis not present

## 2019-02-07 DIAGNOSIS — I5032 Chronic diastolic (congestive) heart failure: Secondary | ICD-10-CM | POA: Diagnosis not present

## 2019-02-09 DIAGNOSIS — I5032 Chronic diastolic (congestive) heart failure: Secondary | ICD-10-CM | POA: Diagnosis not present

## 2019-02-09 DIAGNOSIS — I2510 Atherosclerotic heart disease of native coronary artery without angina pectoris: Secondary | ICD-10-CM | POA: Diagnosis not present

## 2019-02-09 DIAGNOSIS — I509 Heart failure, unspecified: Secondary | ICD-10-CM | POA: Diagnosis not present

## 2019-02-09 DIAGNOSIS — M6281 Muscle weakness (generalized): Secondary | ICD-10-CM | POA: Diagnosis not present

## 2019-02-09 DIAGNOSIS — J449 Chronic obstructive pulmonary disease, unspecified: Secondary | ICD-10-CM | POA: Diagnosis not present

## 2019-02-09 DIAGNOSIS — J918 Pleural effusion in other conditions classified elsewhere: Secondary | ICD-10-CM | POA: Diagnosis not present

## 2019-02-09 DIAGNOSIS — I10 Essential (primary) hypertension: Secondary | ICD-10-CM | POA: Diagnosis not present

## 2019-02-09 DIAGNOSIS — R262 Difficulty in walking, not elsewhere classified: Secondary | ICD-10-CM | POA: Diagnosis not present

## 2019-02-09 DIAGNOSIS — I25118 Atherosclerotic heart disease of native coronary artery with other forms of angina pectoris: Secondary | ICD-10-CM | POA: Diagnosis not present

## 2019-02-09 DIAGNOSIS — S32050A Wedge compression fracture of fifth lumbar vertebra, initial encounter for closed fracture: Secondary | ICD-10-CM | POA: Diagnosis not present

## 2019-02-09 DIAGNOSIS — J9621 Acute and chronic respiratory failure with hypoxia: Secondary | ICD-10-CM | POA: Diagnosis not present

## 2019-02-09 DIAGNOSIS — M545 Low back pain: Secondary | ICD-10-CM | POA: Diagnosis not present

## 2019-02-11 DIAGNOSIS — S32050A Wedge compression fracture of fifth lumbar vertebra, initial encounter for closed fracture: Secondary | ICD-10-CM | POA: Diagnosis not present

## 2019-02-11 DIAGNOSIS — N390 Urinary tract infection, site not specified: Secondary | ICD-10-CM | POA: Diagnosis not present

## 2019-02-11 DIAGNOSIS — M545 Low back pain: Secondary | ICD-10-CM | POA: Diagnosis not present

## 2019-02-11 DIAGNOSIS — R1030 Lower abdominal pain, unspecified: Secondary | ICD-10-CM | POA: Diagnosis not present

## 2019-02-11 DIAGNOSIS — J9621 Acute and chronic respiratory failure with hypoxia: Secondary | ICD-10-CM | POA: Diagnosis not present

## 2019-02-11 DIAGNOSIS — I25118 Atherosclerotic heart disease of native coronary artery with other forms of angina pectoris: Secondary | ICD-10-CM | POA: Diagnosis not present

## 2019-02-11 DIAGNOSIS — I509 Heart failure, unspecified: Secondary | ICD-10-CM | POA: Diagnosis not present

## 2019-02-11 DIAGNOSIS — I5032 Chronic diastolic (congestive) heart failure: Secondary | ICD-10-CM | POA: Diagnosis not present

## 2019-02-11 DIAGNOSIS — I10 Essential (primary) hypertension: Secondary | ICD-10-CM | POA: Diagnosis not present

## 2019-02-11 DIAGNOSIS — J918 Pleural effusion in other conditions classified elsewhere: Secondary | ICD-10-CM | POA: Diagnosis not present

## 2019-02-11 DIAGNOSIS — J449 Chronic obstructive pulmonary disease, unspecified: Secondary | ICD-10-CM | POA: Diagnosis not present

## 2019-02-11 DIAGNOSIS — I2510 Atherosclerotic heart disease of native coronary artery without angina pectoris: Secondary | ICD-10-CM | POA: Diagnosis not present

## 2019-02-17 DIAGNOSIS — E119 Type 2 diabetes mellitus without complications: Secondary | ICD-10-CM | POA: Diagnosis not present

## 2019-02-17 DIAGNOSIS — I10 Essential (primary) hypertension: Secondary | ICD-10-CM | POA: Diagnosis not present

## 2019-02-17 DIAGNOSIS — Z7982 Long term (current) use of aspirin: Secondary | ICD-10-CM | POA: Diagnosis not present

## 2019-02-17 DIAGNOSIS — I2510 Atherosclerotic heart disease of native coronary artery without angina pectoris: Secondary | ICD-10-CM | POA: Diagnosis not present

## 2019-02-17 DIAGNOSIS — Z7952 Long term (current) use of systemic steroids: Secondary | ICD-10-CM | POA: Diagnosis not present

## 2019-02-17 DIAGNOSIS — Z8701 Personal history of pneumonia (recurrent): Secondary | ICD-10-CM | POA: Diagnosis not present

## 2019-02-17 DIAGNOSIS — J918 Pleural effusion in other conditions classified elsewhere: Secondary | ICD-10-CM | POA: Diagnosis not present

## 2019-02-17 DIAGNOSIS — J449 Chronic obstructive pulmonary disease, unspecified: Secondary | ICD-10-CM | POA: Diagnosis not present

## 2019-02-23 DIAGNOSIS — R110 Nausea: Secondary | ICD-10-CM | POA: Diagnosis not present

## 2019-02-23 DIAGNOSIS — R531 Weakness: Secondary | ICD-10-CM | POA: Diagnosis not present

## 2019-02-23 DIAGNOSIS — R5381 Other malaise: Secondary | ICD-10-CM | POA: Diagnosis not present

## 2019-02-23 DIAGNOSIS — Z515 Encounter for palliative care: Secondary | ICD-10-CM | POA: Diagnosis not present

## 2019-02-23 DIAGNOSIS — M545 Low back pain: Secondary | ICD-10-CM | POA: Diagnosis not present

## 2019-02-23 DIAGNOSIS — R0600 Dyspnea, unspecified: Secondary | ICD-10-CM | POA: Diagnosis not present

## 2019-02-24 DIAGNOSIS — J918 Pleural effusion in other conditions classified elsewhere: Secondary | ICD-10-CM | POA: Diagnosis not present

## 2019-02-24 DIAGNOSIS — E119 Type 2 diabetes mellitus without complications: Secondary | ICD-10-CM | POA: Diagnosis not present

## 2019-02-24 DIAGNOSIS — J449 Chronic obstructive pulmonary disease, unspecified: Secondary | ICD-10-CM | POA: Diagnosis not present

## 2019-02-24 DIAGNOSIS — Z8701 Personal history of pneumonia (recurrent): Secondary | ICD-10-CM | POA: Diagnosis not present

## 2019-02-24 DIAGNOSIS — I2510 Atherosclerotic heart disease of native coronary artery without angina pectoris: Secondary | ICD-10-CM | POA: Diagnosis not present

## 2019-02-24 DIAGNOSIS — I10 Essential (primary) hypertension: Secondary | ICD-10-CM | POA: Diagnosis not present

## 2019-03-01 DIAGNOSIS — I10 Essential (primary) hypertension: Secondary | ICD-10-CM | POA: Diagnosis not present

## 2019-03-01 DIAGNOSIS — Z8701 Personal history of pneumonia (recurrent): Secondary | ICD-10-CM | POA: Diagnosis not present

## 2019-03-01 DIAGNOSIS — J449 Chronic obstructive pulmonary disease, unspecified: Secondary | ICD-10-CM | POA: Diagnosis not present

## 2019-03-01 DIAGNOSIS — J918 Pleural effusion in other conditions classified elsewhere: Secondary | ICD-10-CM | POA: Diagnosis not present

## 2019-03-01 DIAGNOSIS — I2510 Atherosclerotic heart disease of native coronary artery without angina pectoris: Secondary | ICD-10-CM | POA: Diagnosis not present

## 2019-03-01 DIAGNOSIS — E119 Type 2 diabetes mellitus without complications: Secondary | ICD-10-CM | POA: Diagnosis not present

## 2019-03-02 DIAGNOSIS — E119 Type 2 diabetes mellitus without complications: Secondary | ICD-10-CM | POA: Diagnosis not present

## 2019-03-02 DIAGNOSIS — J918 Pleural effusion in other conditions classified elsewhere: Secondary | ICD-10-CM | POA: Diagnosis not present

## 2019-03-02 DIAGNOSIS — I10 Essential (primary) hypertension: Secondary | ICD-10-CM | POA: Diagnosis not present

## 2019-03-02 DIAGNOSIS — Z8701 Personal history of pneumonia (recurrent): Secondary | ICD-10-CM | POA: Diagnosis not present

## 2019-03-02 DIAGNOSIS — I2510 Atherosclerotic heart disease of native coronary artery without angina pectoris: Secondary | ICD-10-CM | POA: Diagnosis not present

## 2019-03-02 DIAGNOSIS — J449 Chronic obstructive pulmonary disease, unspecified: Secondary | ICD-10-CM | POA: Diagnosis not present

## 2019-03-03 DIAGNOSIS — I2510 Atherosclerotic heart disease of native coronary artery without angina pectoris: Secondary | ICD-10-CM | POA: Diagnosis not present

## 2019-03-03 DIAGNOSIS — Z8701 Personal history of pneumonia (recurrent): Secondary | ICD-10-CM | POA: Diagnosis not present

## 2019-03-03 DIAGNOSIS — I10 Essential (primary) hypertension: Secondary | ICD-10-CM | POA: Diagnosis not present

## 2019-03-03 DIAGNOSIS — J449 Chronic obstructive pulmonary disease, unspecified: Secondary | ICD-10-CM | POA: Diagnosis not present

## 2019-03-03 DIAGNOSIS — E119 Type 2 diabetes mellitus without complications: Secondary | ICD-10-CM | POA: Diagnosis not present

## 2019-03-03 DIAGNOSIS — J918 Pleural effusion in other conditions classified elsewhere: Secondary | ICD-10-CM | POA: Diagnosis not present

## 2019-03-04 DIAGNOSIS — E119 Type 2 diabetes mellitus without complications: Secondary | ICD-10-CM | POA: Diagnosis not present

## 2019-03-04 DIAGNOSIS — J449 Chronic obstructive pulmonary disease, unspecified: Secondary | ICD-10-CM | POA: Diagnosis not present

## 2019-03-04 DIAGNOSIS — I10 Essential (primary) hypertension: Secondary | ICD-10-CM | POA: Diagnosis not present

## 2019-03-04 DIAGNOSIS — Z8701 Personal history of pneumonia (recurrent): Secondary | ICD-10-CM | POA: Diagnosis not present

## 2019-03-04 DIAGNOSIS — I2510 Atherosclerotic heart disease of native coronary artery without angina pectoris: Secondary | ICD-10-CM | POA: Diagnosis not present

## 2019-03-04 DIAGNOSIS — J918 Pleural effusion in other conditions classified elsewhere: Secondary | ICD-10-CM | POA: Diagnosis not present

## 2019-03-05 DIAGNOSIS — I10 Essential (primary) hypertension: Secondary | ICD-10-CM | POA: Diagnosis not present

## 2019-03-05 DIAGNOSIS — E119 Type 2 diabetes mellitus without complications: Secondary | ICD-10-CM | POA: Diagnosis not present

## 2019-03-05 DIAGNOSIS — I2510 Atherosclerotic heart disease of native coronary artery without angina pectoris: Secondary | ICD-10-CM | POA: Diagnosis not present

## 2019-03-05 DIAGNOSIS — J449 Chronic obstructive pulmonary disease, unspecified: Secondary | ICD-10-CM | POA: Diagnosis not present

## 2019-03-05 DIAGNOSIS — Z8701 Personal history of pneumonia (recurrent): Secondary | ICD-10-CM | POA: Diagnosis not present

## 2019-03-05 DIAGNOSIS — J918 Pleural effusion in other conditions classified elsewhere: Secondary | ICD-10-CM | POA: Diagnosis not present

## 2019-03-08 DIAGNOSIS — J918 Pleural effusion in other conditions classified elsewhere: Secondary | ICD-10-CM | POA: Diagnosis not present

## 2019-03-08 DIAGNOSIS — J449 Chronic obstructive pulmonary disease, unspecified: Secondary | ICD-10-CM | POA: Diagnosis not present

## 2019-03-08 DIAGNOSIS — E119 Type 2 diabetes mellitus without complications: Secondary | ICD-10-CM | POA: Diagnosis not present

## 2019-03-08 DIAGNOSIS — I2510 Atherosclerotic heart disease of native coronary artery without angina pectoris: Secondary | ICD-10-CM | POA: Diagnosis not present

## 2019-03-08 DIAGNOSIS — Z8701 Personal history of pneumonia (recurrent): Secondary | ICD-10-CM | POA: Diagnosis not present

## 2019-03-08 DIAGNOSIS — I10 Essential (primary) hypertension: Secondary | ICD-10-CM | POA: Diagnosis not present

## 2019-03-09 DIAGNOSIS — E7800 Pure hypercholesterolemia, unspecified: Secondary | ICD-10-CM | POA: Diagnosis not present

## 2019-03-09 DIAGNOSIS — E039 Hypothyroidism, unspecified: Secondary | ICD-10-CM | POA: Diagnosis not present

## 2019-03-09 DIAGNOSIS — N390 Urinary tract infection, site not specified: Secondary | ICD-10-CM | POA: Diagnosis not present

## 2019-03-09 DIAGNOSIS — Z0000 Encounter for general adult medical examination without abnormal findings: Secondary | ICD-10-CM | POA: Diagnosis not present

## 2019-03-09 DIAGNOSIS — E119 Type 2 diabetes mellitus without complications: Secondary | ICD-10-CM | POA: Diagnosis not present

## 2019-03-09 DIAGNOSIS — I10 Essential (primary) hypertension: Secondary | ICD-10-CM | POA: Diagnosis not present

## 2019-03-09 DIAGNOSIS — E559 Vitamin D deficiency, unspecified: Secondary | ICD-10-CM | POA: Diagnosis not present

## 2019-03-09 DIAGNOSIS — D649 Anemia, unspecified: Secondary | ICD-10-CM | POA: Diagnosis not present

## 2019-03-09 IMAGING — CR DG CHEST 2V
2 series · 2 of 2 positions shown · non-contrast
Comparison: 06/29/2016 radiographs

CLINICAL DATA: Pt reports feeling tightness in her chest today
while doing physical therapy for ACDF surgery on 10-10-16. Hx of CHF,
COPD, CAD, diabetes, HTN, pulmonary embolism, pulmonary sarcoidosis.
Nonsmoker.

EXAM:
CHEST  2 VIEW

[chest lat]
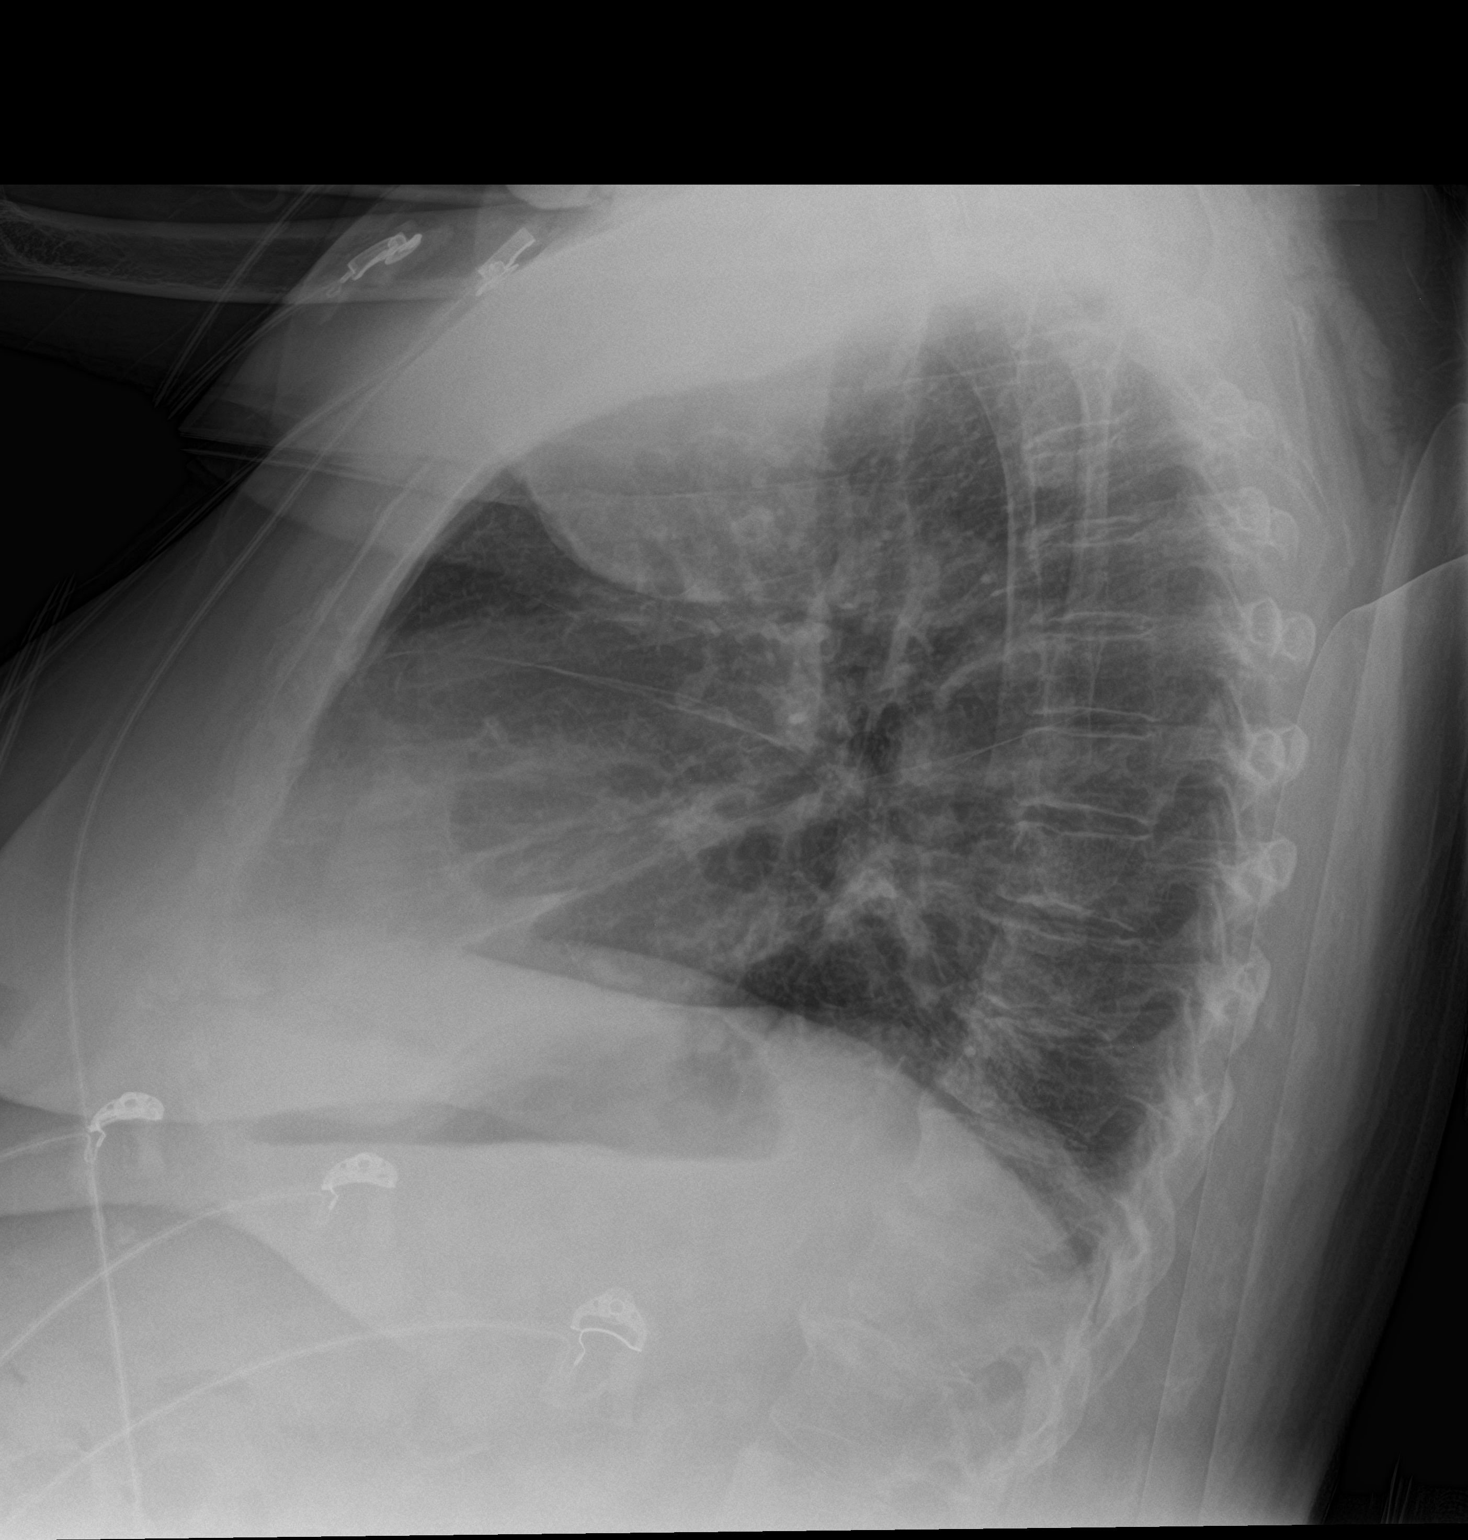

[chest ap]
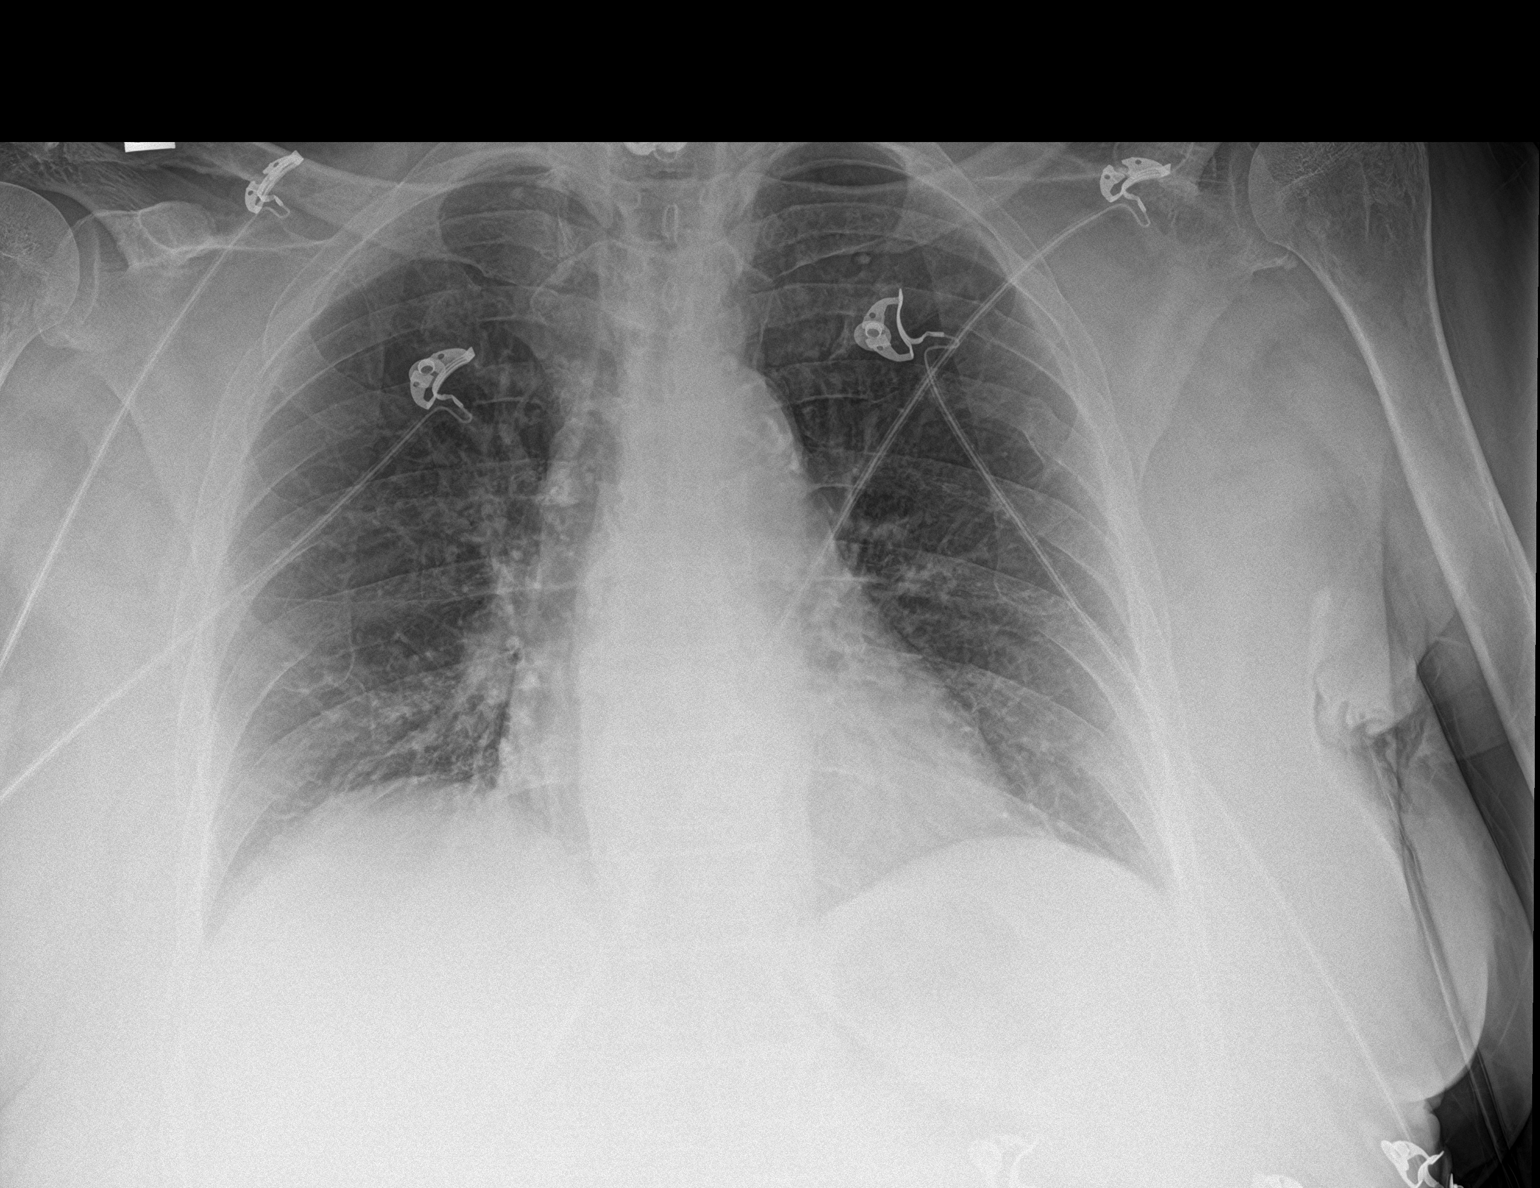

[2 of 2 positions shown; findings below may reference images not displayed]

FINDINGS: Atherosclerotic calcification of the aortic arch. Lower cervical
plate and screw fixator.

Heart size within normal limits for technique. Left rib deformities
from old fractures.

Thoracic spondylosis. No blunting of the costophrenic angles. Mild
chronic interstitial prominence although better than on 06/29/2016.
IMPRESSION: 1.  Aortic Atherosclerosis ([id number]).
2. Thoracic spondylosis.
3. Mild chronic interstitial accentuation without acute findings.

## 2019-03-10 DIAGNOSIS — J918 Pleural effusion in other conditions classified elsewhere: Secondary | ICD-10-CM | POA: Diagnosis not present

## 2019-03-10 DIAGNOSIS — J449 Chronic obstructive pulmonary disease, unspecified: Secondary | ICD-10-CM | POA: Diagnosis not present

## 2019-03-10 DIAGNOSIS — E119 Type 2 diabetes mellitus without complications: Secondary | ICD-10-CM | POA: Diagnosis not present

## 2019-03-10 DIAGNOSIS — I2510 Atherosclerotic heart disease of native coronary artery without angina pectoris: Secondary | ICD-10-CM | POA: Diagnosis not present

## 2019-03-10 DIAGNOSIS — I10 Essential (primary) hypertension: Secondary | ICD-10-CM | POA: Diagnosis not present

## 2019-03-10 DIAGNOSIS — Z8701 Personal history of pneumonia (recurrent): Secondary | ICD-10-CM | POA: Diagnosis not present

## 2019-03-11 DIAGNOSIS — I2510 Atherosclerotic heart disease of native coronary artery without angina pectoris: Secondary | ICD-10-CM | POA: Diagnosis not present

## 2019-03-11 DIAGNOSIS — J918 Pleural effusion in other conditions classified elsewhere: Secondary | ICD-10-CM | POA: Diagnosis not present

## 2019-03-11 DIAGNOSIS — Z8701 Personal history of pneumonia (recurrent): Secondary | ICD-10-CM | POA: Diagnosis not present

## 2019-03-11 DIAGNOSIS — E119 Type 2 diabetes mellitus without complications: Secondary | ICD-10-CM | POA: Diagnosis not present

## 2019-03-11 DIAGNOSIS — J449 Chronic obstructive pulmonary disease, unspecified: Secondary | ICD-10-CM | POA: Diagnosis not present

## 2019-03-11 DIAGNOSIS — I10 Essential (primary) hypertension: Secondary | ICD-10-CM | POA: Diagnosis not present

## 2019-03-12 DIAGNOSIS — I10 Essential (primary) hypertension: Secondary | ICD-10-CM | POA: Diagnosis not present

## 2019-03-12 DIAGNOSIS — I2510 Atherosclerotic heart disease of native coronary artery without angina pectoris: Secondary | ICD-10-CM | POA: Diagnosis not present

## 2019-03-12 DIAGNOSIS — E119 Type 2 diabetes mellitus without complications: Secondary | ICD-10-CM | POA: Diagnosis not present

## 2019-03-12 DIAGNOSIS — J449 Chronic obstructive pulmonary disease, unspecified: Secondary | ICD-10-CM | POA: Diagnosis not present

## 2019-03-12 DIAGNOSIS — J918 Pleural effusion in other conditions classified elsewhere: Secondary | ICD-10-CM | POA: Diagnosis not present

## 2019-03-12 DIAGNOSIS — Z8701 Personal history of pneumonia (recurrent): Secondary | ICD-10-CM | POA: Diagnosis not present

## 2019-03-14 DIAGNOSIS — I10 Essential (primary) hypertension: Secondary | ICD-10-CM | POA: Diagnosis not present

## 2019-03-14 DIAGNOSIS — E119 Type 2 diabetes mellitus without complications: Secondary | ICD-10-CM | POA: Diagnosis not present

## 2019-03-14 DIAGNOSIS — I2510 Atherosclerotic heart disease of native coronary artery without angina pectoris: Secondary | ICD-10-CM | POA: Diagnosis not present

## 2019-03-14 DIAGNOSIS — J918 Pleural effusion in other conditions classified elsewhere: Secondary | ICD-10-CM | POA: Diagnosis not present

## 2019-03-14 DIAGNOSIS — Z8701 Personal history of pneumonia (recurrent): Secondary | ICD-10-CM | POA: Diagnosis not present

## 2019-03-14 DIAGNOSIS — J449 Chronic obstructive pulmonary disease, unspecified: Secondary | ICD-10-CM | POA: Diagnosis not present

## 2019-03-15 DIAGNOSIS — Z8701 Personal history of pneumonia (recurrent): Secondary | ICD-10-CM | POA: Diagnosis not present

## 2019-03-15 DIAGNOSIS — J449 Chronic obstructive pulmonary disease, unspecified: Secondary | ICD-10-CM | POA: Diagnosis not present

## 2019-03-15 DIAGNOSIS — E119 Type 2 diabetes mellitus without complications: Secondary | ICD-10-CM | POA: Diagnosis not present

## 2019-03-15 DIAGNOSIS — I10 Essential (primary) hypertension: Secondary | ICD-10-CM | POA: Diagnosis not present

## 2019-03-15 DIAGNOSIS — J918 Pleural effusion in other conditions classified elsewhere: Secondary | ICD-10-CM | POA: Diagnosis not present

## 2019-03-15 DIAGNOSIS — I2510 Atherosclerotic heart disease of native coronary artery without angina pectoris: Secondary | ICD-10-CM | POA: Diagnosis not present

## 2019-03-16 DIAGNOSIS — I10 Essential (primary) hypertension: Secondary | ICD-10-CM | POA: Diagnosis not present

## 2019-03-16 DIAGNOSIS — J918 Pleural effusion in other conditions classified elsewhere: Secondary | ICD-10-CM | POA: Diagnosis not present

## 2019-03-16 DIAGNOSIS — I2510 Atherosclerotic heart disease of native coronary artery without angina pectoris: Secondary | ICD-10-CM | POA: Diagnosis not present

## 2019-03-16 DIAGNOSIS — J449 Chronic obstructive pulmonary disease, unspecified: Secondary | ICD-10-CM | POA: Diagnosis not present

## 2019-03-16 DIAGNOSIS — E119 Type 2 diabetes mellitus without complications: Secondary | ICD-10-CM | POA: Diagnosis not present

## 2019-03-16 DIAGNOSIS — Z8701 Personal history of pneumonia (recurrent): Secondary | ICD-10-CM | POA: Diagnosis not present

## 2019-03-18 DIAGNOSIS — J449 Chronic obstructive pulmonary disease, unspecified: Secondary | ICD-10-CM | POA: Diagnosis not present

## 2019-03-18 DIAGNOSIS — Z8701 Personal history of pneumonia (recurrent): Secondary | ICD-10-CM | POA: Diagnosis not present

## 2019-03-18 DIAGNOSIS — I2510 Atherosclerotic heart disease of native coronary artery without angina pectoris: Secondary | ICD-10-CM | POA: Diagnosis not present

## 2019-03-18 DIAGNOSIS — I10 Essential (primary) hypertension: Secondary | ICD-10-CM | POA: Diagnosis not present

## 2019-03-18 DIAGNOSIS — J918 Pleural effusion in other conditions classified elsewhere: Secondary | ICD-10-CM | POA: Diagnosis not present

## 2019-03-18 DIAGNOSIS — E119 Type 2 diabetes mellitus without complications: Secondary | ICD-10-CM | POA: Diagnosis not present

## 2019-03-19 DIAGNOSIS — Z7982 Long term (current) use of aspirin: Secondary | ICD-10-CM | POA: Diagnosis not present

## 2019-03-19 DIAGNOSIS — I2510 Atherosclerotic heart disease of native coronary artery without angina pectoris: Secondary | ICD-10-CM | POA: Diagnosis not present

## 2019-03-19 DIAGNOSIS — E119 Type 2 diabetes mellitus without complications: Secondary | ICD-10-CM | POA: Diagnosis not present

## 2019-03-19 DIAGNOSIS — Z8701 Personal history of pneumonia (recurrent): Secondary | ICD-10-CM | POA: Diagnosis not present

## 2019-03-19 DIAGNOSIS — Z7952 Long term (current) use of systemic steroids: Secondary | ICD-10-CM | POA: Diagnosis not present

## 2019-03-19 DIAGNOSIS — I10 Essential (primary) hypertension: Secondary | ICD-10-CM | POA: Diagnosis not present

## 2019-03-19 DIAGNOSIS — J449 Chronic obstructive pulmonary disease, unspecified: Secondary | ICD-10-CM | POA: Diagnosis not present

## 2019-03-19 DIAGNOSIS — J918 Pleural effusion in other conditions classified elsewhere: Secondary | ICD-10-CM | POA: Diagnosis not present

## 2019-03-22 DIAGNOSIS — Z8701 Personal history of pneumonia (recurrent): Secondary | ICD-10-CM | POA: Diagnosis not present

## 2019-03-22 DIAGNOSIS — E119 Type 2 diabetes mellitus without complications: Secondary | ICD-10-CM | POA: Diagnosis not present

## 2019-03-22 DIAGNOSIS — J449 Chronic obstructive pulmonary disease, unspecified: Secondary | ICD-10-CM | POA: Diagnosis not present

## 2019-03-22 DIAGNOSIS — I2510 Atherosclerotic heart disease of native coronary artery without angina pectoris: Secondary | ICD-10-CM | POA: Diagnosis not present

## 2019-03-22 DIAGNOSIS — I10 Essential (primary) hypertension: Secondary | ICD-10-CM | POA: Diagnosis not present

## 2019-03-22 DIAGNOSIS — J918 Pleural effusion in other conditions classified elsewhere: Secondary | ICD-10-CM | POA: Diagnosis not present

## 2019-03-24 DIAGNOSIS — Z8701 Personal history of pneumonia (recurrent): Secondary | ICD-10-CM | POA: Diagnosis not present

## 2019-03-24 DIAGNOSIS — I2510 Atherosclerotic heart disease of native coronary artery without angina pectoris: Secondary | ICD-10-CM | POA: Diagnosis not present

## 2019-03-24 DIAGNOSIS — E119 Type 2 diabetes mellitus without complications: Secondary | ICD-10-CM | POA: Diagnosis not present

## 2019-03-24 DIAGNOSIS — I10 Essential (primary) hypertension: Secondary | ICD-10-CM | POA: Diagnosis not present

## 2019-03-24 DIAGNOSIS — J918 Pleural effusion in other conditions classified elsewhere: Secondary | ICD-10-CM | POA: Diagnosis not present

## 2019-03-24 DIAGNOSIS — J449 Chronic obstructive pulmonary disease, unspecified: Secondary | ICD-10-CM | POA: Diagnosis not present

## 2019-03-25 DIAGNOSIS — J449 Chronic obstructive pulmonary disease, unspecified: Secondary | ICD-10-CM | POA: Diagnosis not present

## 2019-03-25 DIAGNOSIS — J918 Pleural effusion in other conditions classified elsewhere: Secondary | ICD-10-CM | POA: Diagnosis not present

## 2019-03-25 DIAGNOSIS — E119 Type 2 diabetes mellitus without complications: Secondary | ICD-10-CM | POA: Diagnosis not present

## 2019-03-25 DIAGNOSIS — I10 Essential (primary) hypertension: Secondary | ICD-10-CM | POA: Diagnosis not present

## 2019-03-25 DIAGNOSIS — I2510 Atherosclerotic heart disease of native coronary artery without angina pectoris: Secondary | ICD-10-CM | POA: Diagnosis not present

## 2019-03-25 DIAGNOSIS — Z8701 Personal history of pneumonia (recurrent): Secondary | ICD-10-CM | POA: Diagnosis not present

## 2019-03-26 DIAGNOSIS — J449 Chronic obstructive pulmonary disease, unspecified: Secondary | ICD-10-CM | POA: Diagnosis not present

## 2019-03-26 DIAGNOSIS — E119 Type 2 diabetes mellitus without complications: Secondary | ICD-10-CM | POA: Diagnosis not present

## 2019-03-26 DIAGNOSIS — Z8701 Personal history of pneumonia (recurrent): Secondary | ICD-10-CM | POA: Diagnosis not present

## 2019-03-26 DIAGNOSIS — I2510 Atherosclerotic heart disease of native coronary artery without angina pectoris: Secondary | ICD-10-CM | POA: Diagnosis not present

## 2019-03-26 DIAGNOSIS — I10 Essential (primary) hypertension: Secondary | ICD-10-CM | POA: Diagnosis not present

## 2019-03-26 DIAGNOSIS — J918 Pleural effusion in other conditions classified elsewhere: Secondary | ICD-10-CM | POA: Diagnosis not present

## 2019-03-28 DIAGNOSIS — I2510 Atherosclerotic heart disease of native coronary artery without angina pectoris: Secondary | ICD-10-CM | POA: Diagnosis not present

## 2019-03-28 DIAGNOSIS — I10 Essential (primary) hypertension: Secondary | ICD-10-CM | POA: Diagnosis not present

## 2019-03-28 DIAGNOSIS — Z8701 Personal history of pneumonia (recurrent): Secondary | ICD-10-CM | POA: Diagnosis not present

## 2019-03-28 DIAGNOSIS — J449 Chronic obstructive pulmonary disease, unspecified: Secondary | ICD-10-CM | POA: Diagnosis not present

## 2019-03-28 DIAGNOSIS — E119 Type 2 diabetes mellitus without complications: Secondary | ICD-10-CM | POA: Diagnosis not present

## 2019-03-28 DIAGNOSIS — J918 Pleural effusion in other conditions classified elsewhere: Secondary | ICD-10-CM | POA: Diagnosis not present

## 2019-03-29 DIAGNOSIS — I2510 Atherosclerotic heart disease of native coronary artery without angina pectoris: Secondary | ICD-10-CM | POA: Diagnosis not present

## 2019-03-29 DIAGNOSIS — J449 Chronic obstructive pulmonary disease, unspecified: Secondary | ICD-10-CM | POA: Diagnosis not present

## 2019-03-29 DIAGNOSIS — J918 Pleural effusion in other conditions classified elsewhere: Secondary | ICD-10-CM | POA: Diagnosis not present

## 2019-03-29 DIAGNOSIS — Z8701 Personal history of pneumonia (recurrent): Secondary | ICD-10-CM | POA: Diagnosis not present

## 2019-03-29 DIAGNOSIS — E119 Type 2 diabetes mellitus without complications: Secondary | ICD-10-CM | POA: Diagnosis not present

## 2019-03-29 DIAGNOSIS — I10 Essential (primary) hypertension: Secondary | ICD-10-CM | POA: Diagnosis not present

## 2019-03-31 DIAGNOSIS — I10 Essential (primary) hypertension: Secondary | ICD-10-CM | POA: Diagnosis not present

## 2019-03-31 DIAGNOSIS — J918 Pleural effusion in other conditions classified elsewhere: Secondary | ICD-10-CM | POA: Diagnosis not present

## 2019-03-31 DIAGNOSIS — I2510 Atherosclerotic heart disease of native coronary artery without angina pectoris: Secondary | ICD-10-CM | POA: Diagnosis not present

## 2019-03-31 DIAGNOSIS — E119 Type 2 diabetes mellitus without complications: Secondary | ICD-10-CM | POA: Diagnosis not present

## 2019-03-31 DIAGNOSIS — Z8701 Personal history of pneumonia (recurrent): Secondary | ICD-10-CM | POA: Diagnosis not present

## 2019-03-31 DIAGNOSIS — J449 Chronic obstructive pulmonary disease, unspecified: Secondary | ICD-10-CM | POA: Diagnosis not present

## 2019-04-01 DIAGNOSIS — E119 Type 2 diabetes mellitus without complications: Secondary | ICD-10-CM | POA: Diagnosis not present

## 2019-04-01 DIAGNOSIS — J918 Pleural effusion in other conditions classified elsewhere: Secondary | ICD-10-CM | POA: Diagnosis not present

## 2019-04-01 DIAGNOSIS — I2510 Atherosclerotic heart disease of native coronary artery without angina pectoris: Secondary | ICD-10-CM | POA: Diagnosis not present

## 2019-04-01 DIAGNOSIS — Z8701 Personal history of pneumonia (recurrent): Secondary | ICD-10-CM | POA: Diagnosis not present

## 2019-04-01 DIAGNOSIS — J449 Chronic obstructive pulmonary disease, unspecified: Secondary | ICD-10-CM | POA: Diagnosis not present

## 2019-04-01 DIAGNOSIS — I10 Essential (primary) hypertension: Secondary | ICD-10-CM | POA: Diagnosis not present

## 2019-04-06 DIAGNOSIS — I10 Essential (primary) hypertension: Secondary | ICD-10-CM | POA: Diagnosis not present

## 2019-04-06 DIAGNOSIS — I2510 Atherosclerotic heart disease of native coronary artery without angina pectoris: Secondary | ICD-10-CM | POA: Diagnosis not present

## 2019-04-06 DIAGNOSIS — E119 Type 2 diabetes mellitus without complications: Secondary | ICD-10-CM | POA: Diagnosis not present

## 2019-04-06 DIAGNOSIS — Z8701 Personal history of pneumonia (recurrent): Secondary | ICD-10-CM | POA: Diagnosis not present

## 2019-04-06 DIAGNOSIS — J449 Chronic obstructive pulmonary disease, unspecified: Secondary | ICD-10-CM | POA: Diagnosis not present

## 2019-04-06 DIAGNOSIS — J918 Pleural effusion in other conditions classified elsewhere: Secondary | ICD-10-CM | POA: Diagnosis not present

## 2019-04-06 IMAGING — RF DG SWALLOWING FUNCTION
1 series · 1 of 1 positions shown · non-contrast
Comparison: None.

CLINICAL DATA: Dysphagia.

EXAM:
MODIFIED BARIUM SWALLOW
TECHNIQUE: Different consistencies of barium were administered orally to the
patient by the Speech Pathologist. Imaging of the pharynx was
performed in the lateral projection.
FLUOROSCOPY TIME:  Fluoroscopy Time:  1 minutes 12 seconds
Radiation Exposure Index (if provided by the fluoroscopic device):
8.9 mGy
Number of Acquired Spot Images: Cine images

[Series 1: cp_standard · 0.17mm/px · 1 of 1 slices shown]
[im 1/1]
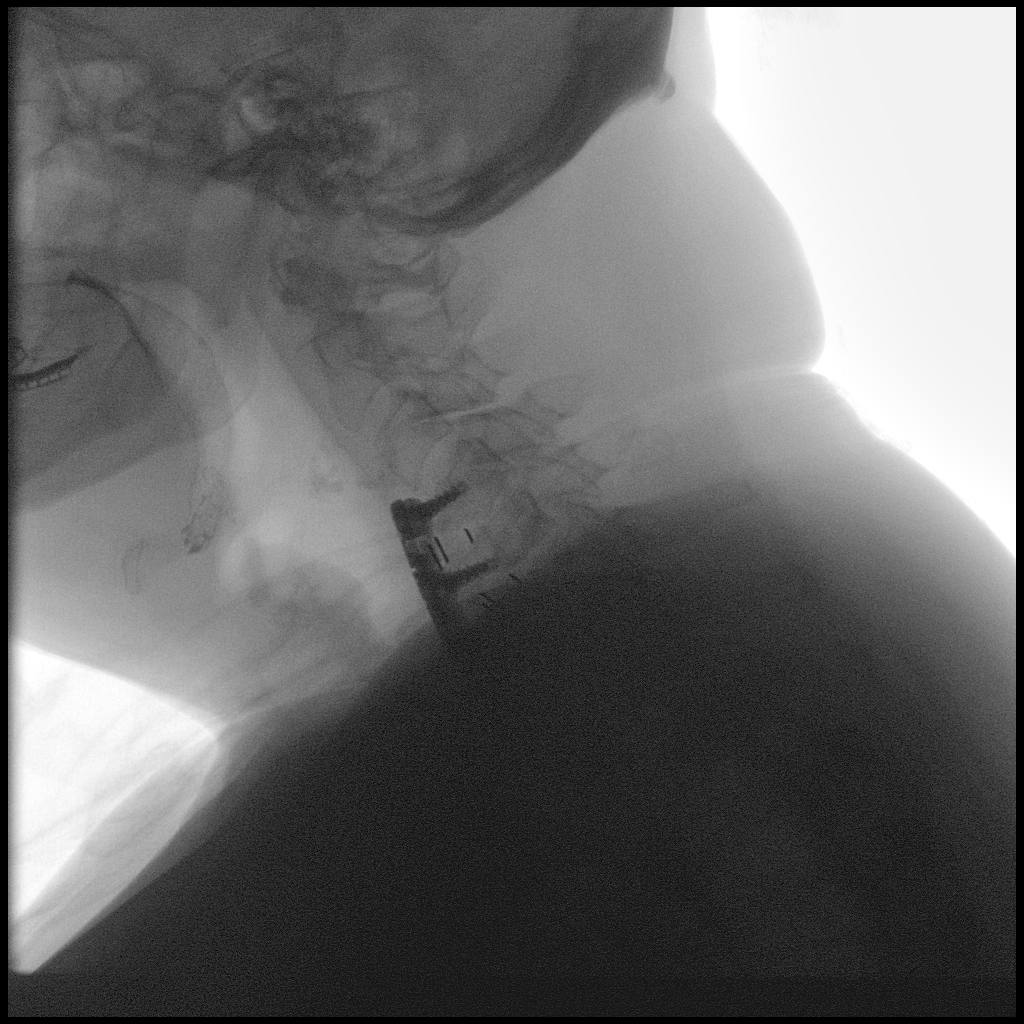

[1 of 1 positions shown; findings below may reference images not displayed]

FINDINGS: Thin liquid- flash penetration otherwise within normal limits.

Nectar thick liquid- within normal limits

Adursh?Bisha within normal limits

Adursh?Quirijn with cracker- within normal limits
IMPRESSION: Flash penetration with thin liquid.  Exam otherwise unremarkable .

Please refer to the Speech Pathologists report for complete details
and recommendations.

## 2019-04-06 IMAGING — RF DG SWALLOWING FUNCTION
8 series · 13 of 24 positions shown · non-contrast
Comparison: None.

CLINICAL DATA: Dysphagia.

EXAM:
MODIFIED BARIUM SWALLOW
TECHNIQUE: Different consistencies of barium were administered orally to the
patient by the Speech Pathologist. Imaging of the pharynx was
performed in the lateral projection.
FLUOROSCOPY TIME:  Fluoroscopy Time:  1 minutes 12 seconds
Radiation Exposure Index (if provided by the fluoroscopic device):
8.9 mGy
Number of Acquired Spot Images: Cine images

[Series 1: run · 2 of 125 frames shown (1 of 8)]
[frame 19/125]
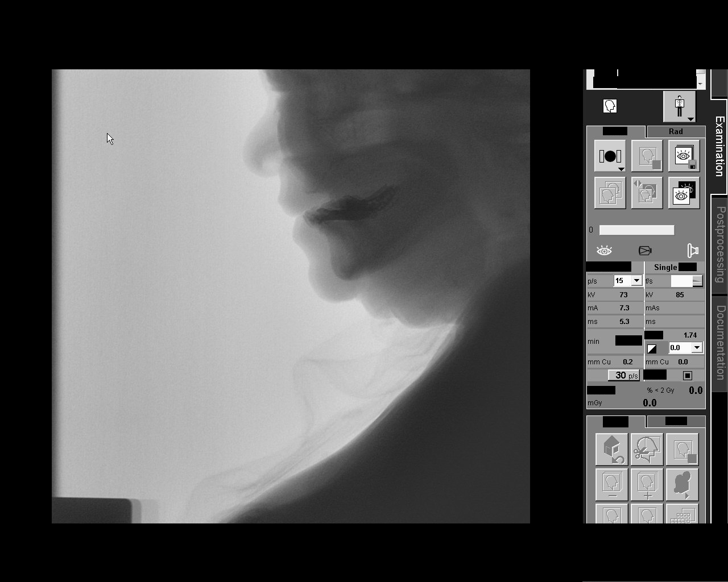
[frame 107/125]
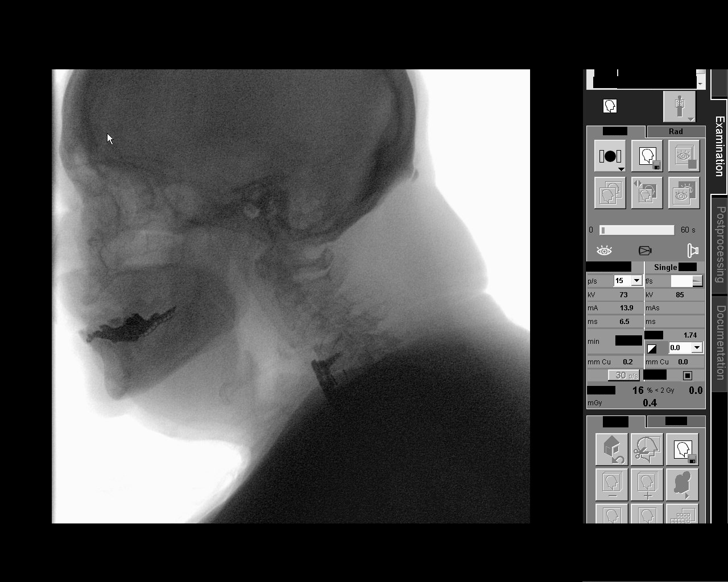

[Series 2: run · 1 of 84 frames shown (2 of 8)]
[frame 43/84]
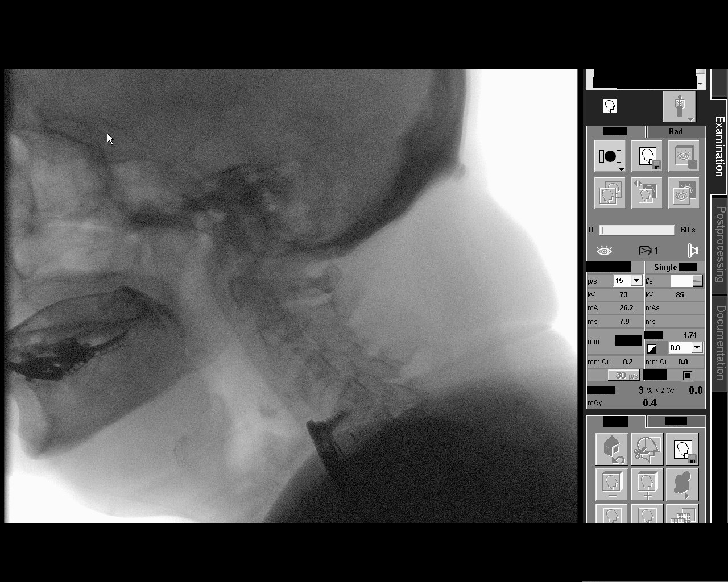

[Series 3: run · 2 of 70 frames shown (3 of 8)]
[frame 1/70]
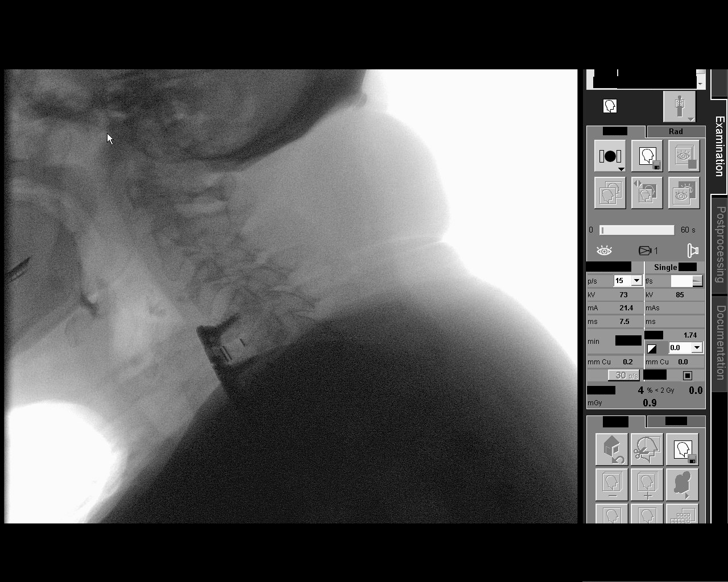
[frame 36/70]
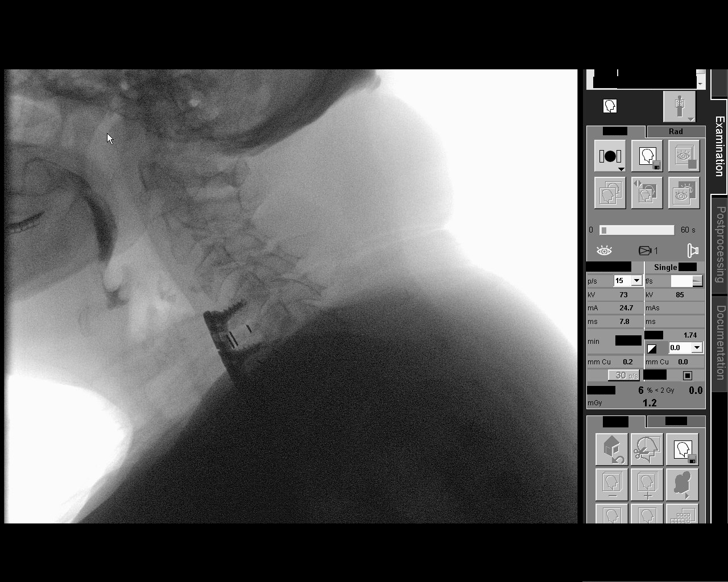

[Series 4: run · 1 of 84 frames shown (4 of 8)]
[frame 13/84]
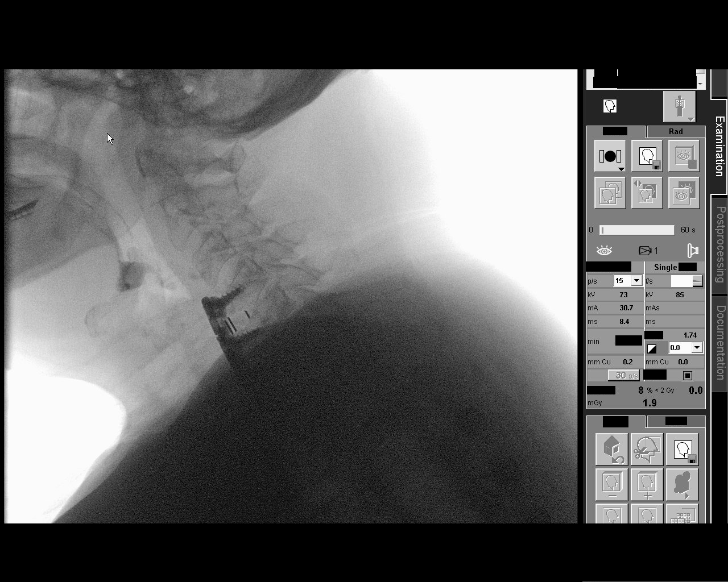

[Series 5: run · 2 of 29 frames shown (5 of 8)]
[frame 1/29]
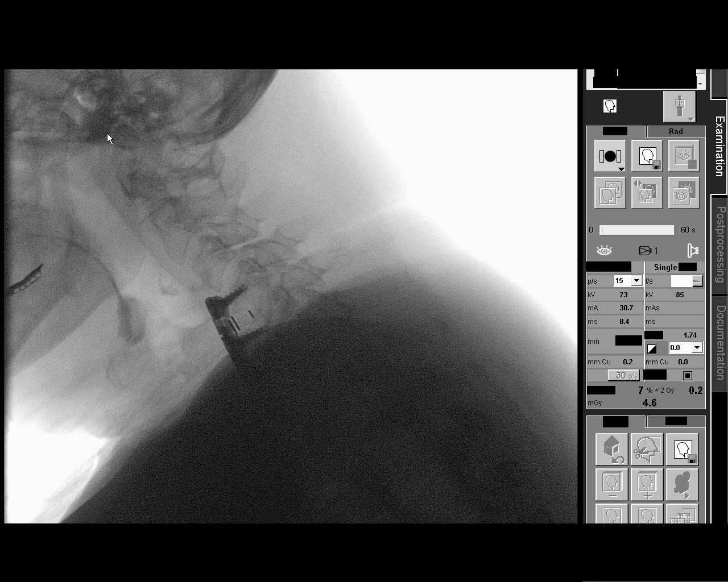
[frame 5/29]
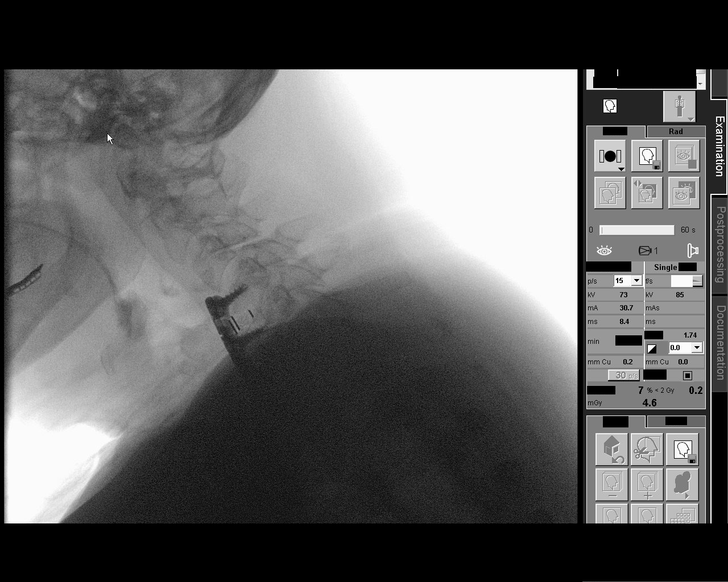

[Series 6: run · 2 of 10 frames shown (6 of 8)]
[frame 2/10]
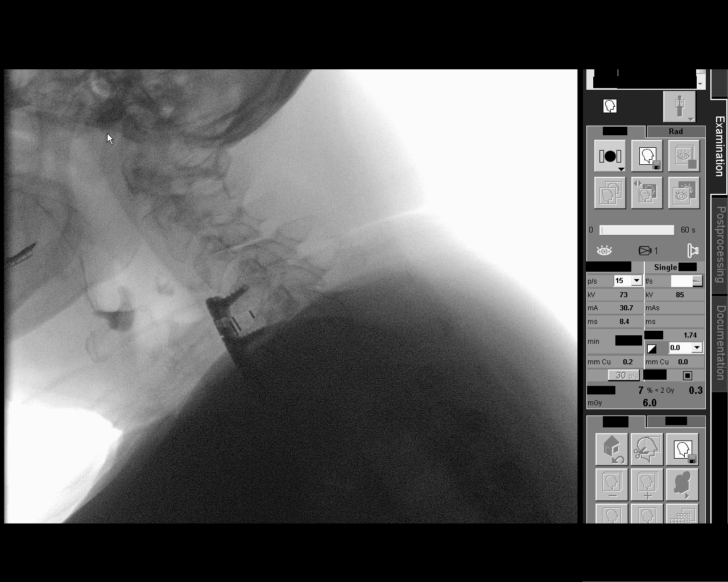
[frame 6/10]
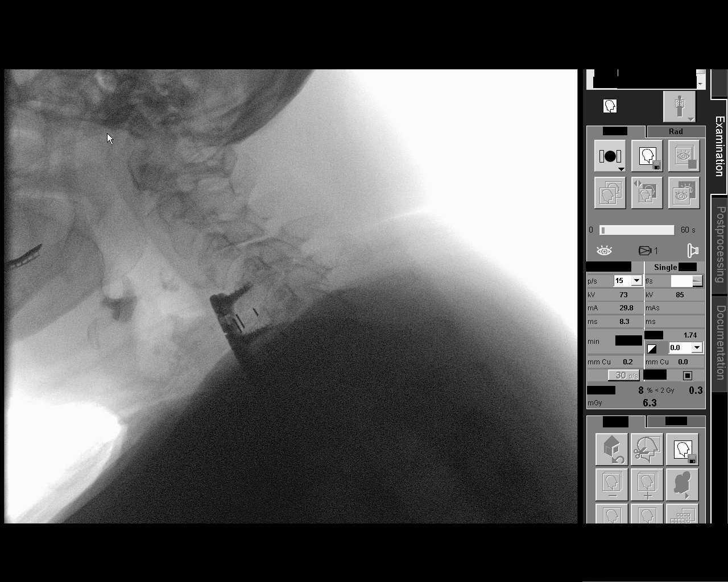

[Series 7: run · 2 of 26 frames shown (7 of 8)]
[frame 14/26]
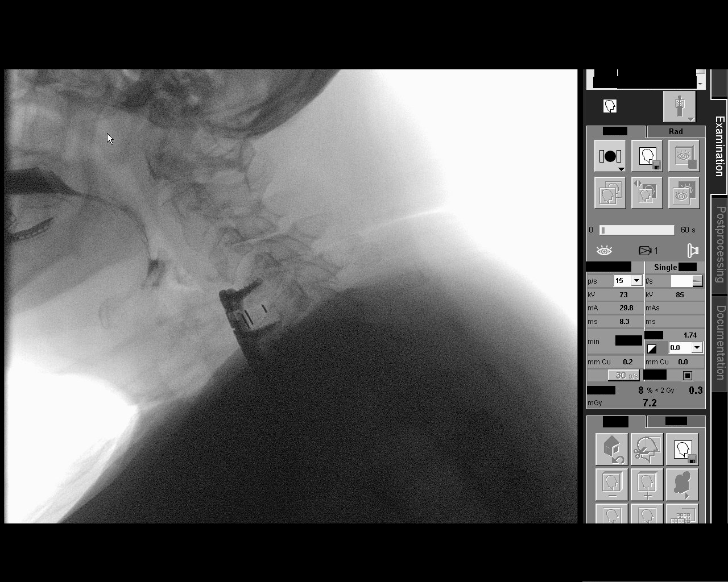
[frame 23/26]
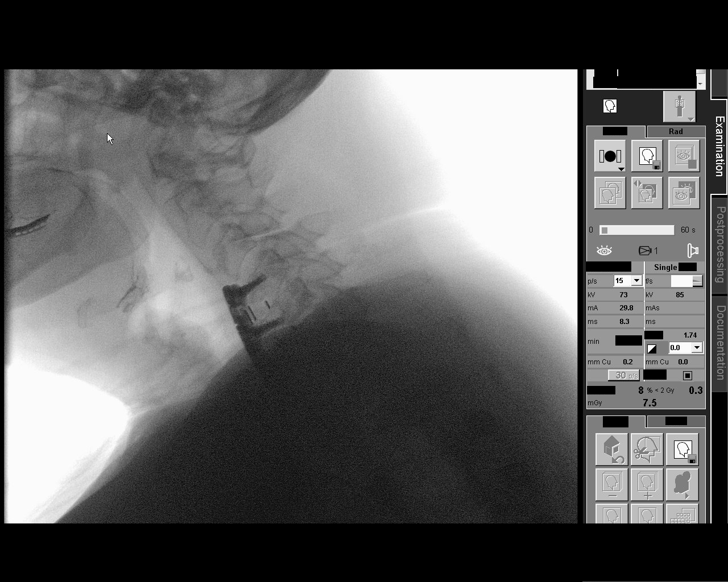

[Series 8: run · 1 of 16 frames shown (8 of 8)]
[frame 14/16]
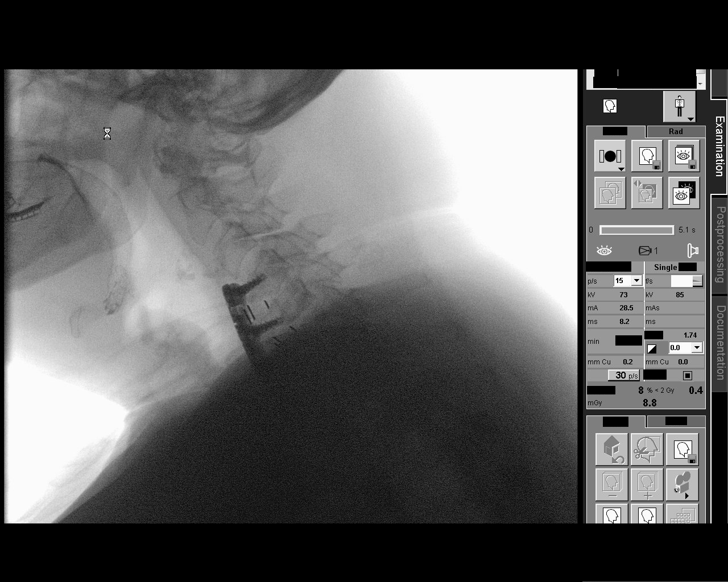

[13 of 24 positions shown; findings below may reference images not displayed]

FINDINGS: Thin liquid- flash penetration otherwise within normal limits.

Nectar thick liquid- within normal limits

Adursh?Bisha within normal limits

Adursh?Quirijn with cracker- within normal limits
IMPRESSION: Flash penetration with thin liquid.  Exam otherwise unremarkable .

Please refer to the Speech Pathologists report for complete details
and recommendations.

## 2019-04-16 IMAGING — DX DG ABDOMEN 1V
1 series · 1 of 1 positions shown · non-contrast
Comparison: 04/19/2015.  CT 11/02/2014 .

CLINICAL DATA: Abdominal distention.  Weight gain.

EXAM:
ABDOMEN - 1 VIEW

[abdomen standing ap]
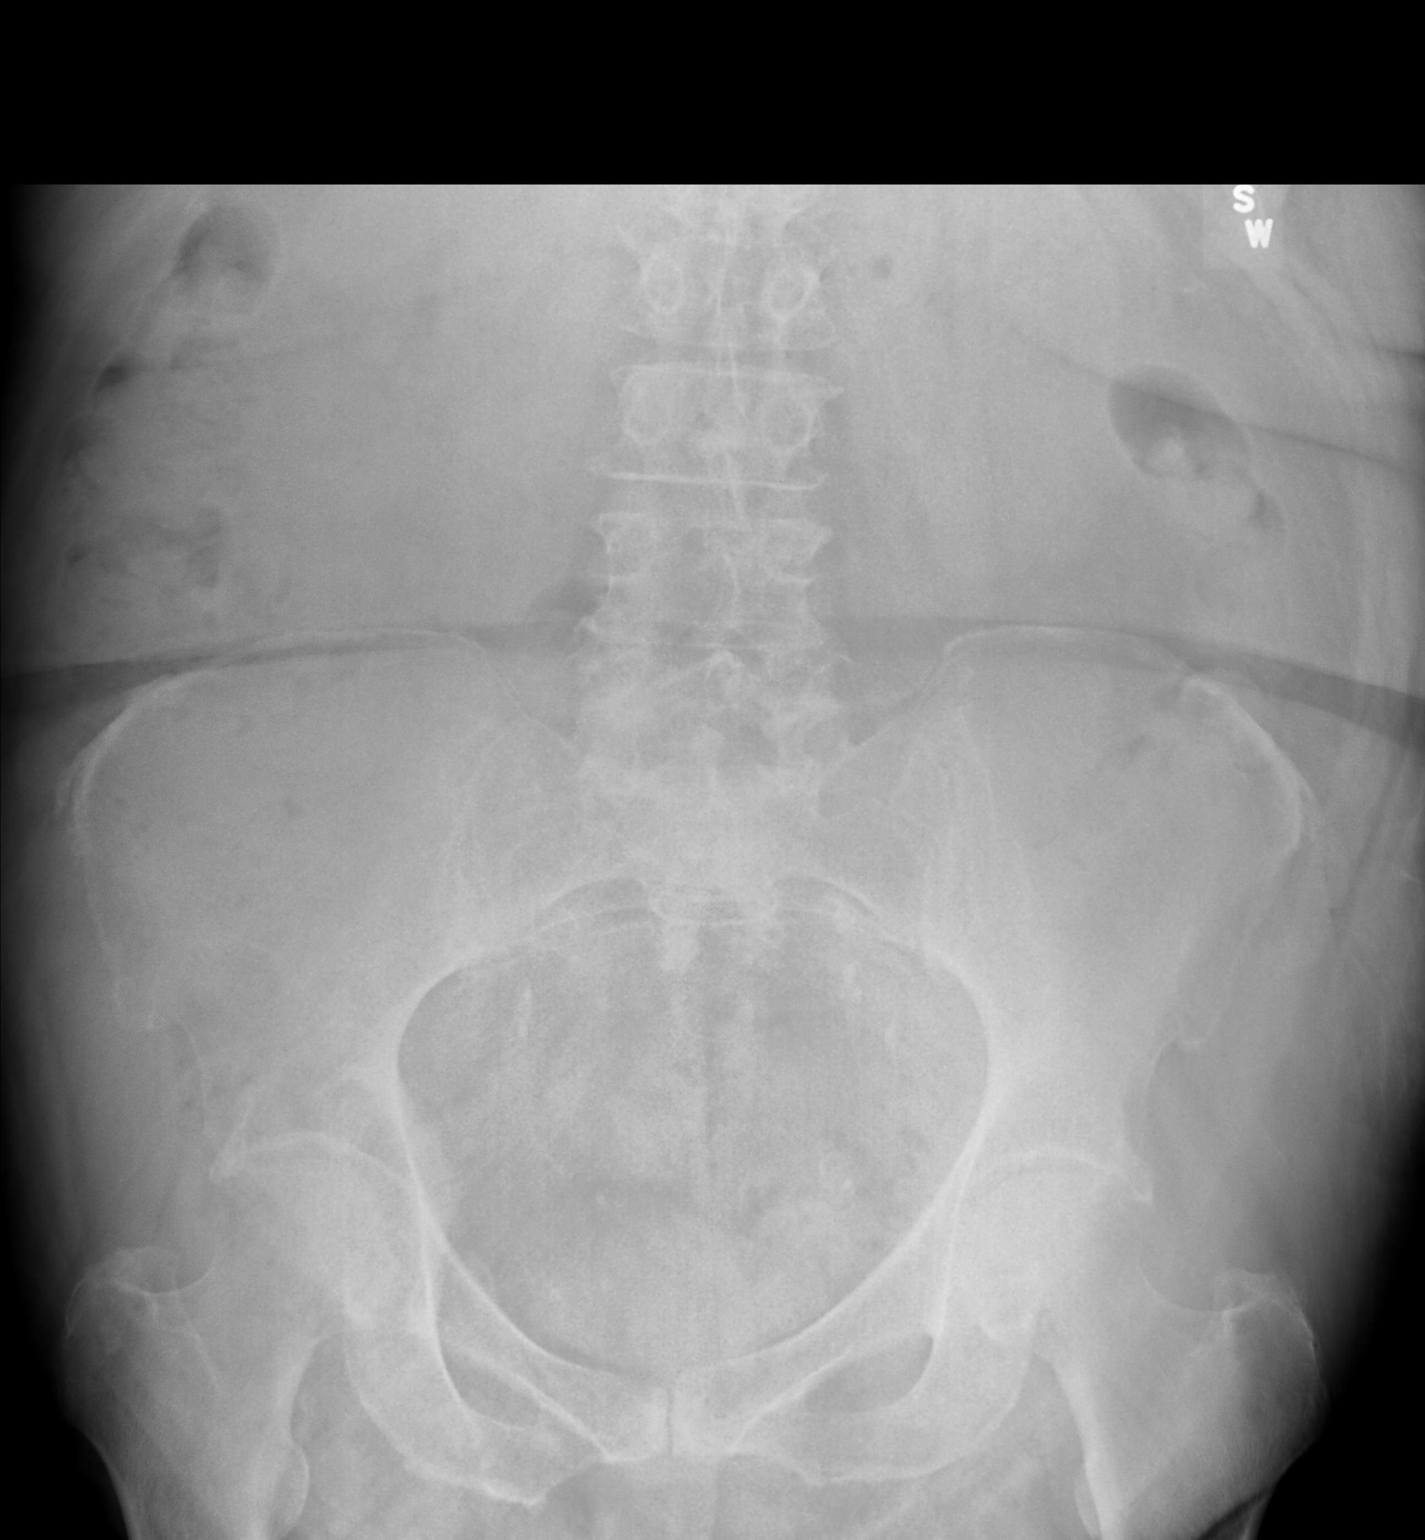

[1 of 1 positions shown; findings below may reference images not displayed]

FINDINGS: Soft tissue structures are unremarkable. No bowel distention. No
free air. Stool noted throughout the colon . Multiple pelvic
calcifications are noted most consistent with phleboliths.
Aortoiliac atherosclerotic vascular calcification. Degenerative
changes lumbar spine and both hips.
IMPRESSION: 1. Stool noted throughout the colon. Constipation cannot be
excluded. No bowel distention.

2.  Aortoiliac atherosclerotic vascular disease.

## 2019-04-18 IMAGING — CR DG CHEST 2V
1 series · 2 of 2 positions shown · non-contrast
Comparison: Chest x-ray 11/14/2016 .

CLINICAL DATA: Shortness of breath.  Chest pain .

EXAM:
CHEST  2 VIEW

[Series 1: dg chest 2 view · 0.14mm/px · 2 of 2 slices shown]
[im 1/2]
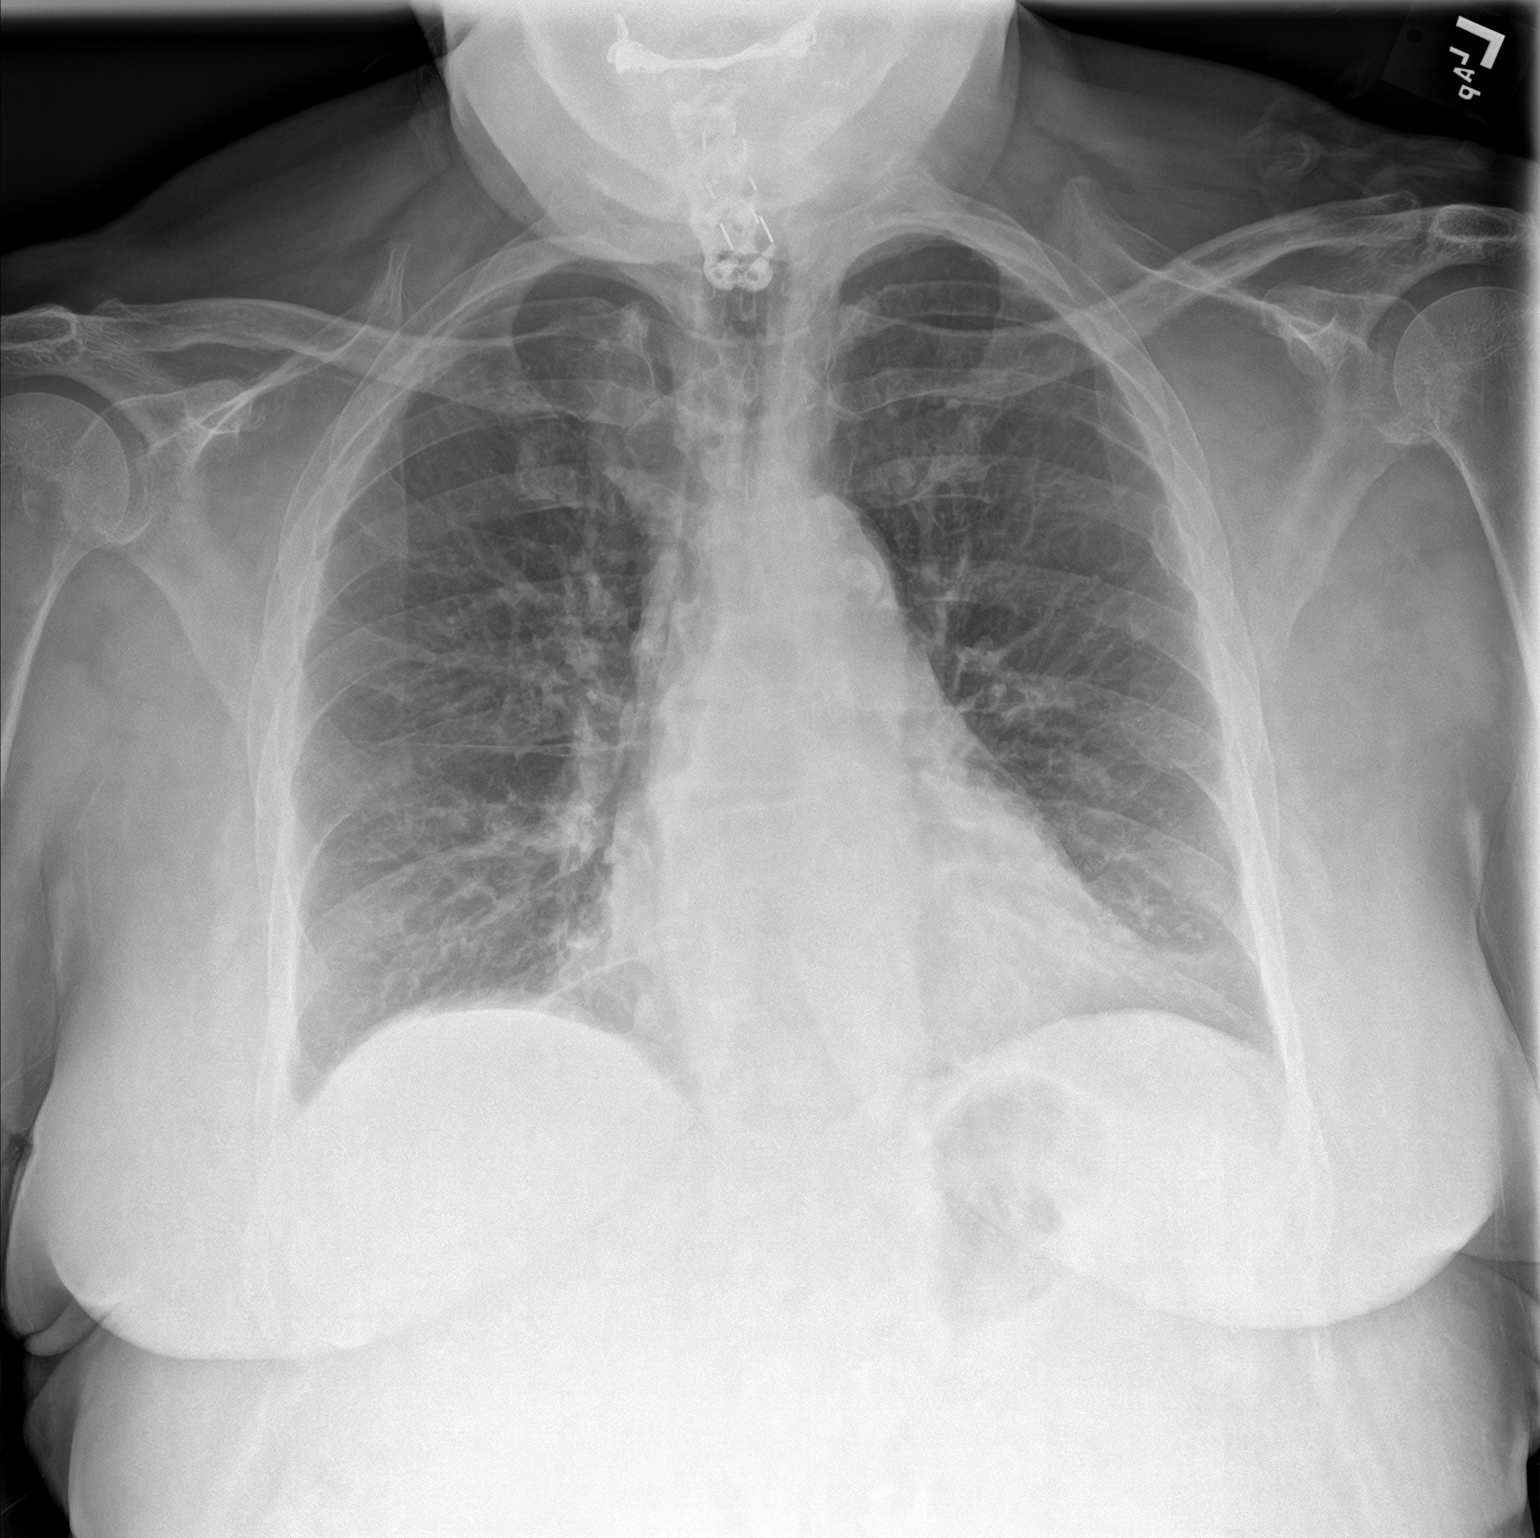
[im 2/2]
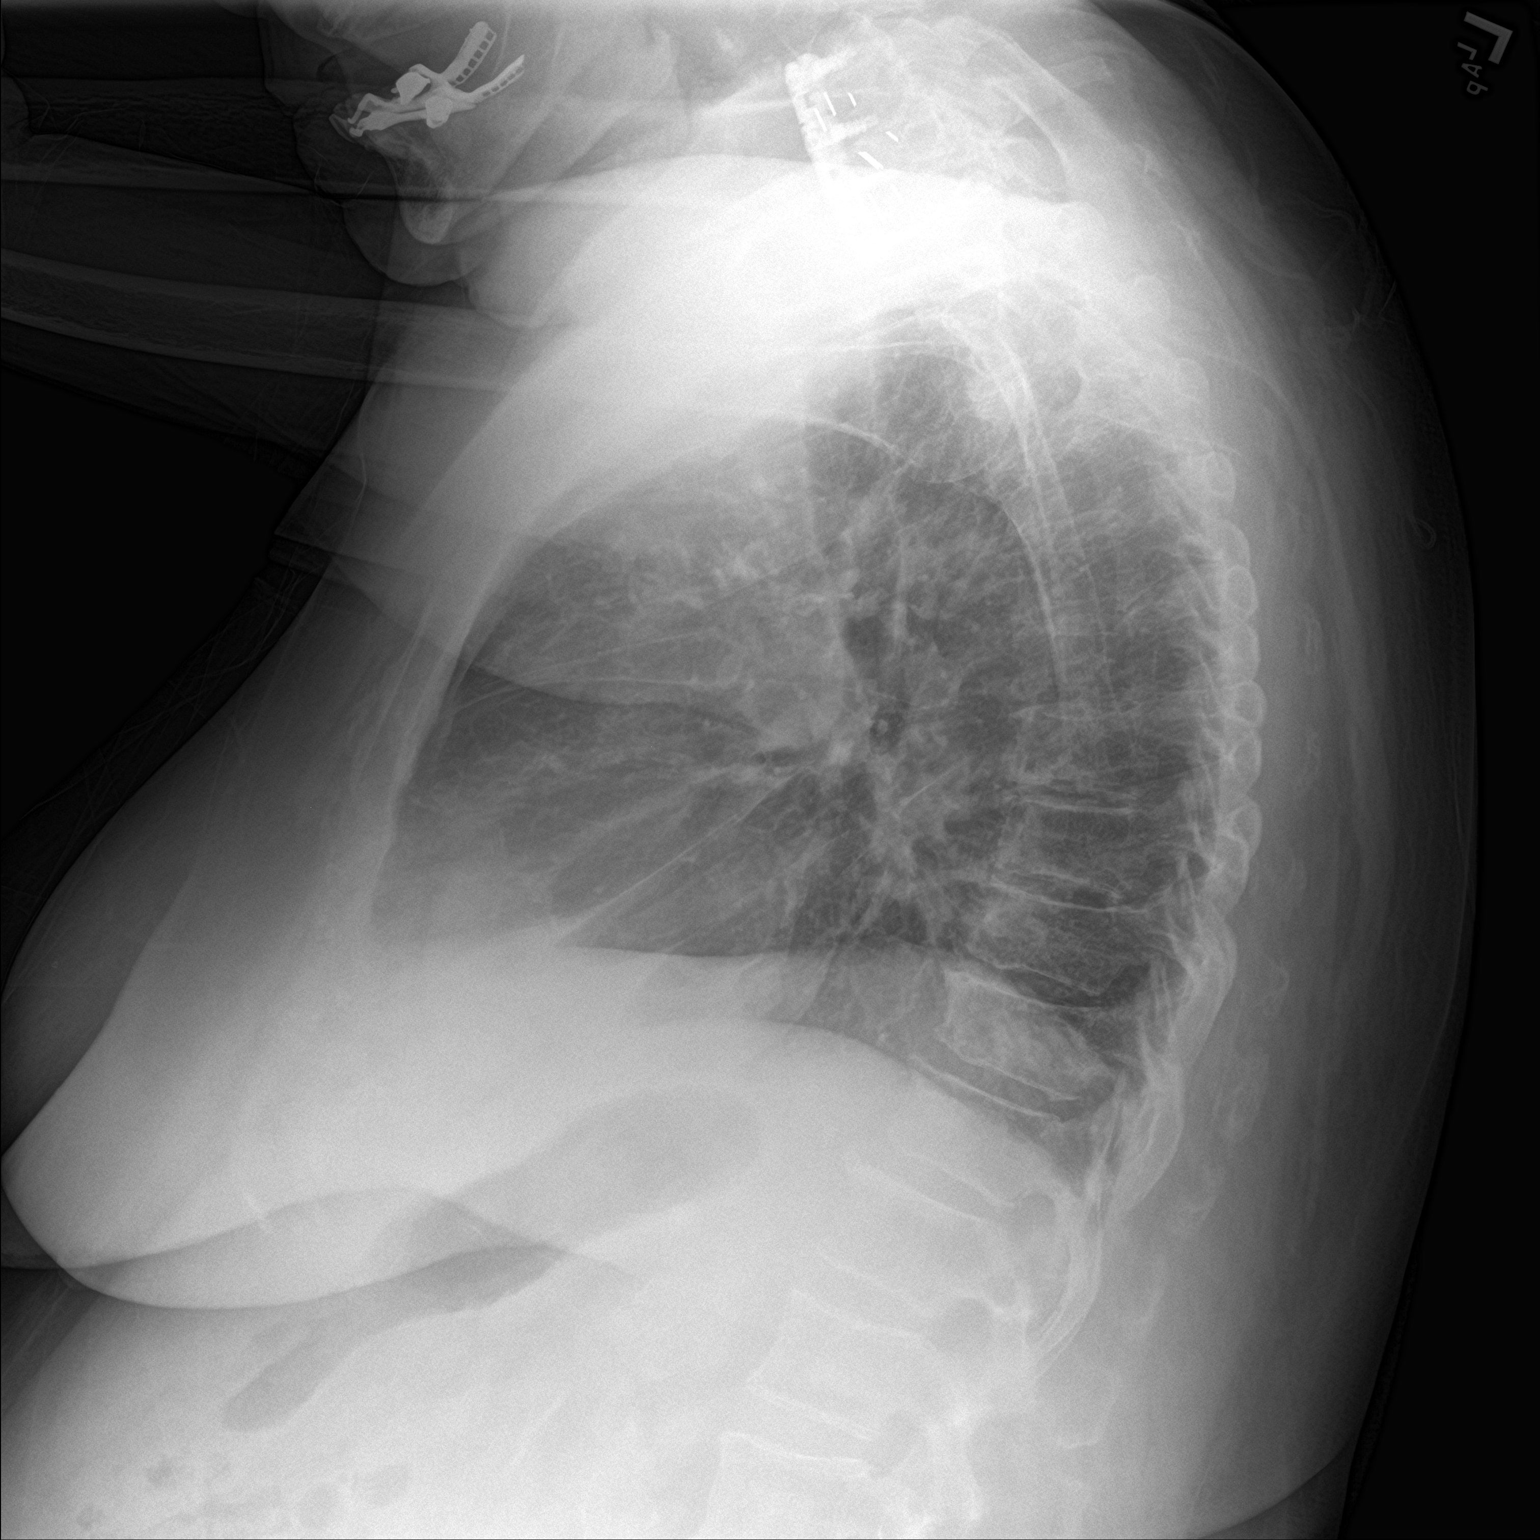

[2 of 2 positions shown; findings below may reference images not displayed]

FINDINGS: Mediastinum and hilar structures normal. Heart size normal. Stable
chronic interstitial changes. No focal acute abnormality. Mild
pleural-parenchymal thickening again noted consistent scarring . No
pleural effusion or pneumothorax. Prior cervicothoracic spine fusion
.
IMPRESSION: Stable chronic pulmonary interstitial changes and changes of
pleural-parenchymal scarring. No acute abnormality.

## 2019-07-28 DIAGNOSIS — E119 Type 2 diabetes mellitus without complications: Secondary | ICD-10-CM | POA: Diagnosis not present

## 2019-07-28 DIAGNOSIS — R0602 Shortness of breath: Secondary | ICD-10-CM | POA: Diagnosis not present

## 2019-07-28 DIAGNOSIS — R5383 Other fatigue: Secondary | ICD-10-CM | POA: Diagnosis not present

## 2019-07-28 DIAGNOSIS — Z955 Presence of coronary angioplasty implant and graft: Secondary | ICD-10-CM | POA: Diagnosis not present

## 2019-07-28 DIAGNOSIS — E785 Hyperlipidemia, unspecified: Secondary | ICD-10-CM | POA: Diagnosis not present

## 2019-07-28 DIAGNOSIS — I2510 Atherosclerotic heart disease of native coronary artery without angina pectoris: Secondary | ICD-10-CM | POA: Diagnosis not present

## 2019-07-28 DIAGNOSIS — I4510 Unspecified right bundle-branch block: Secondary | ICD-10-CM | POA: Diagnosis not present

## 2019-07-28 DIAGNOSIS — R9431 Abnormal electrocardiogram [ECG] [EKG]: Secondary | ICD-10-CM | POA: Diagnosis not present

## 2019-07-28 DIAGNOSIS — E669 Obesity, unspecified: Secondary | ICD-10-CM | POA: Diagnosis not present

## 2019-07-28 DIAGNOSIS — I119 Hypertensive heart disease without heart failure: Secondary | ICD-10-CM | POA: Diagnosis not present

## 2019-07-28 DIAGNOSIS — I10 Essential (primary) hypertension: Secondary | ICD-10-CM | POA: Diagnosis not present

## 2019-07-28 DIAGNOSIS — R079 Chest pain, unspecified: Secondary | ICD-10-CM | POA: Diagnosis not present

## 2019-09-15 IMAGING — CT CT ABD-PELV W/O CM
2 of 4 series · 14 of 36 positions shown, 17 images · non-contrast
Comparison: Chest x-ray from earlier in the same day, ultrasound of
the abdomen from 12/29/2016, CT of the chest from 07/11/2015

CLINICAL DATA: Chest pain, abdominal distension

EXAM:
CT CHEST, ABDOMEN AND PELVIS WITHOUT CONTRAST
TECHNIQUE: Multidetector CT imaging of the chest, abdomen and pelvis was
performed following the standard protocol without IV contrast.

[Series 2: cap wo st · axial · 0.81mm/px · z∈[-589,-69]mm · 11 of 124 slices shown, 14 images]
[im 10/124  mediastinal]
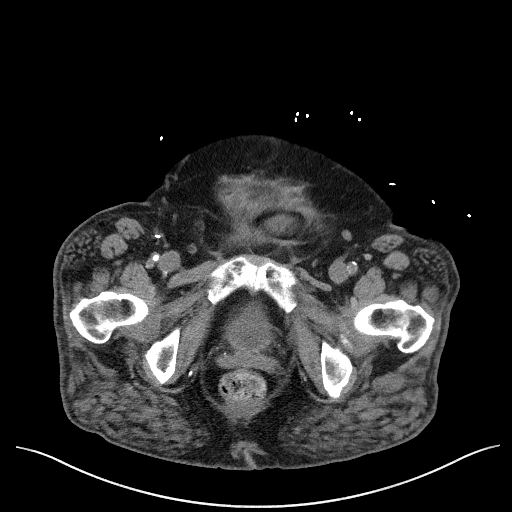
[im 10/124  lung]
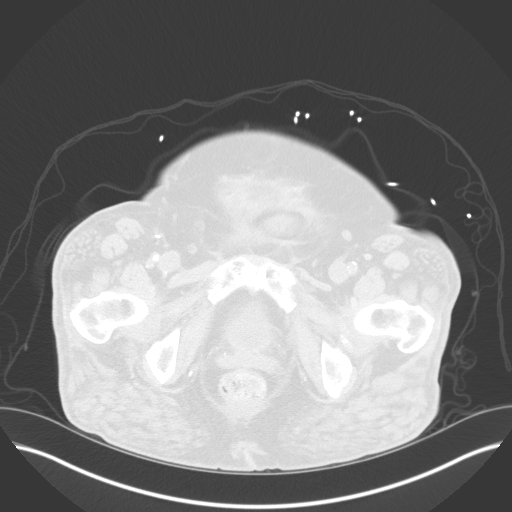
[im 19/124  lung]
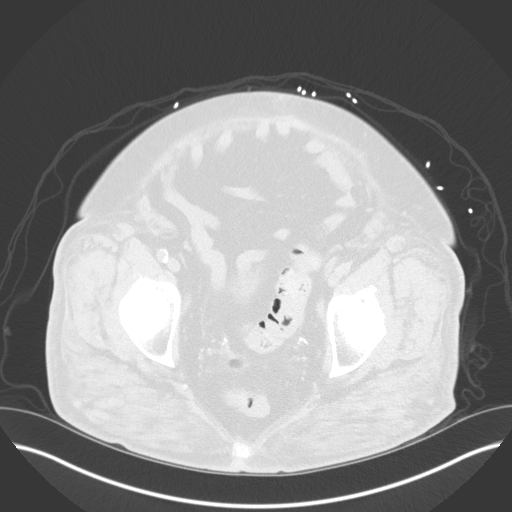
[im 29/124  lung]
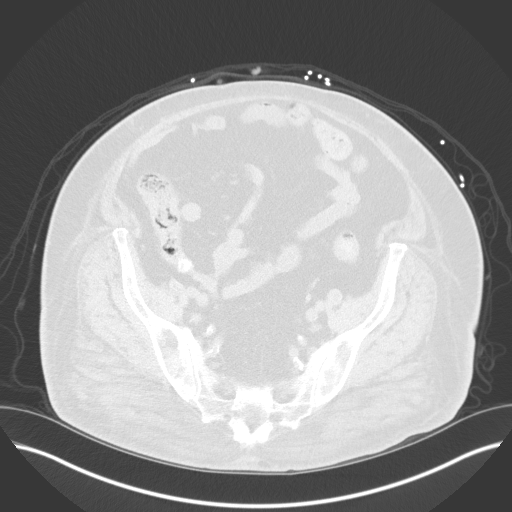
[im 38/124  lung]
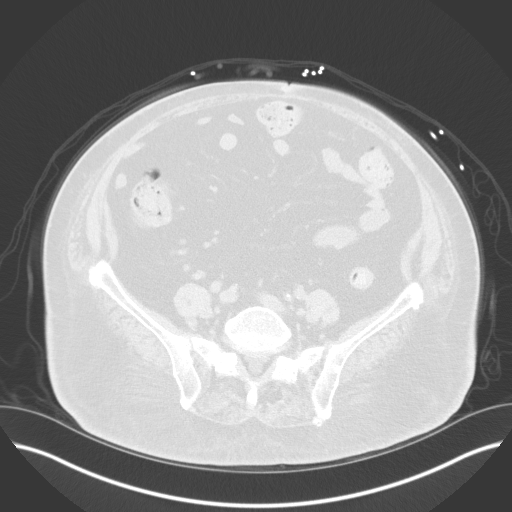
[im 48/124  mediastinal]
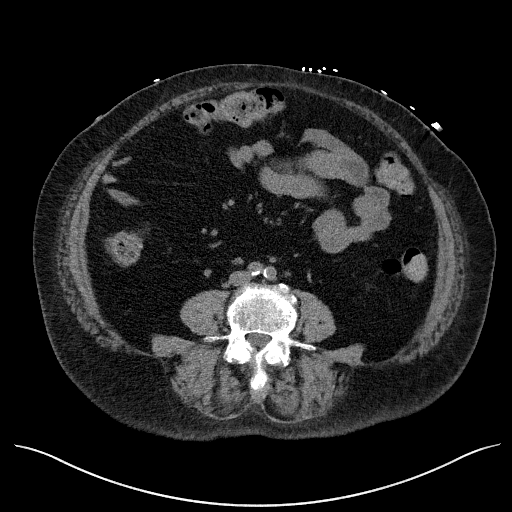
[im 48/124  lung]
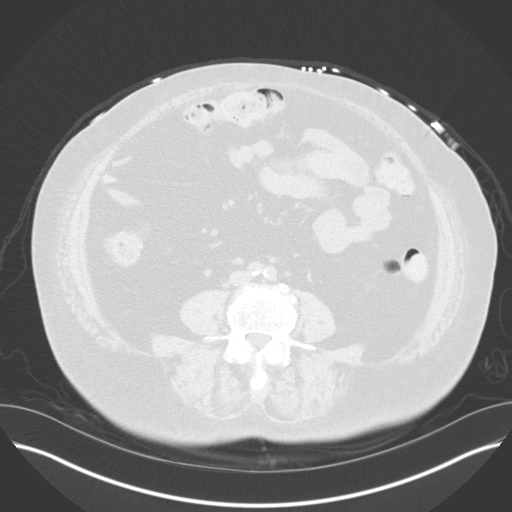
[im 67/124  lung]
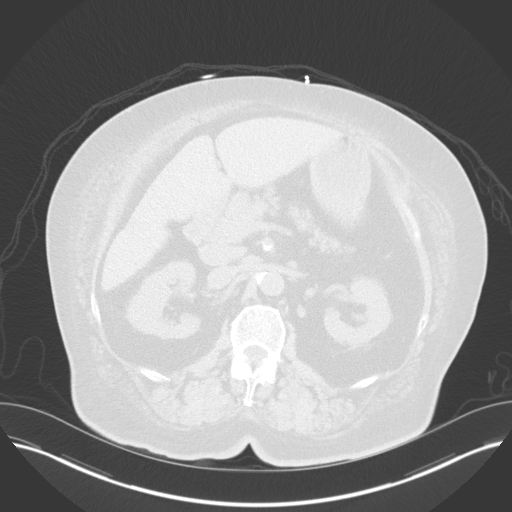
[im 76/124  lung]
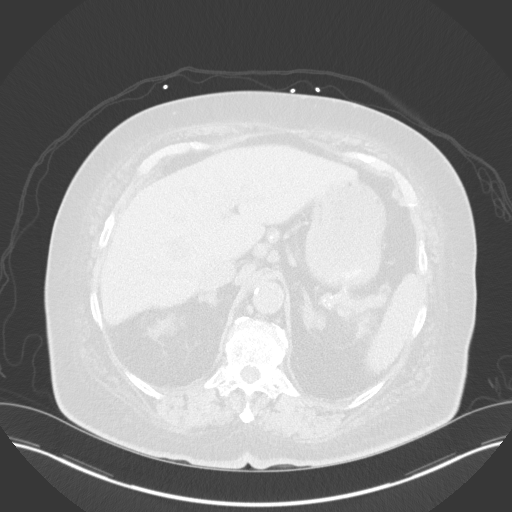
[im 86/124  lung]
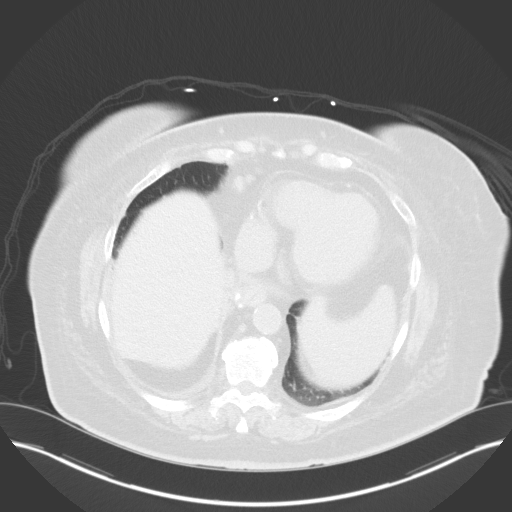
[im 95/124  mediastinal]
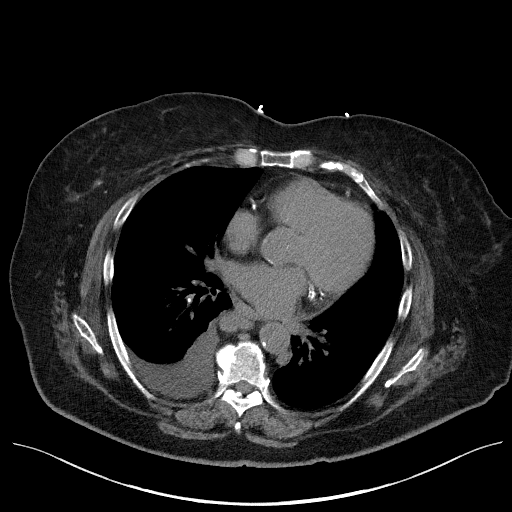
[im 95/124  lung]
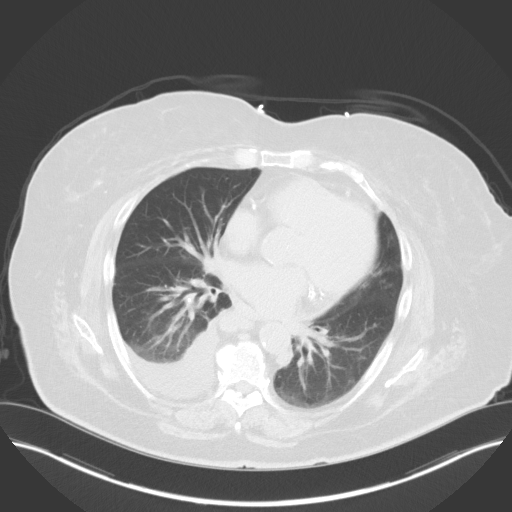
[im 105/124  lung]
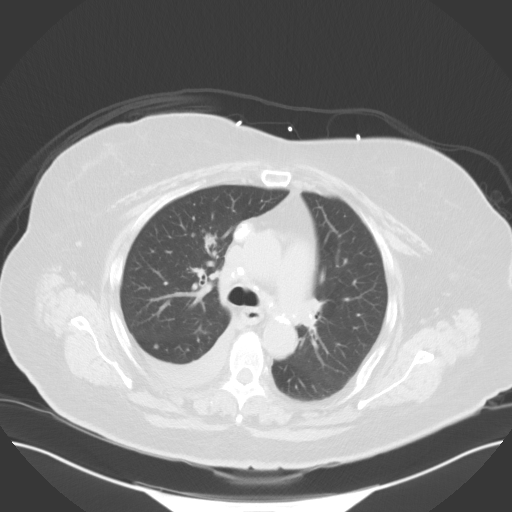
[im 114/124  lung]
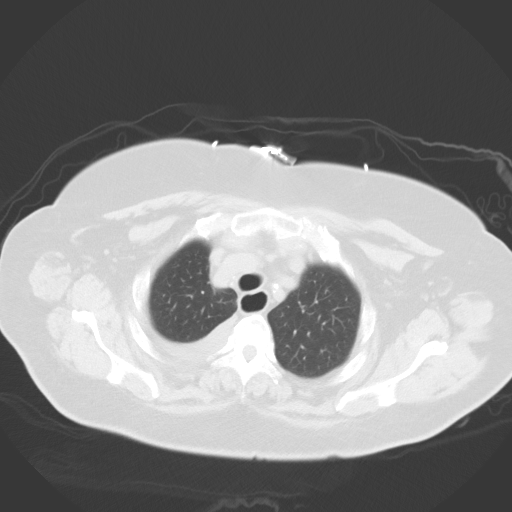

[Series 5: coronal · coronal · 0.92mm/px · 3 of 162 slices shown]
[im 33/162  lung]
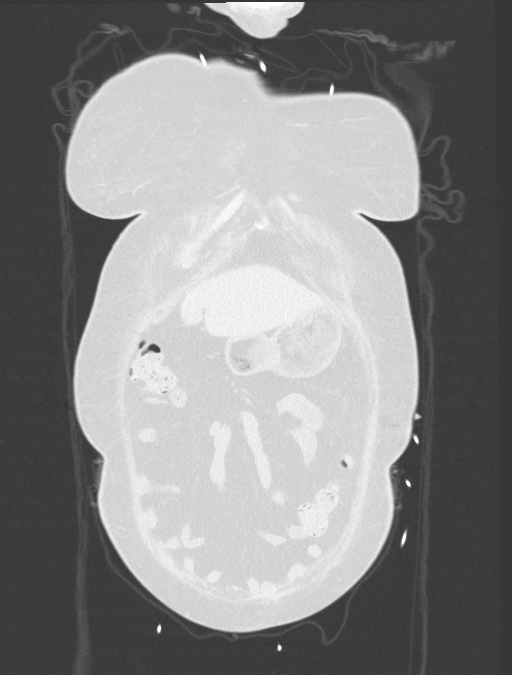
[im 65/162  lung]
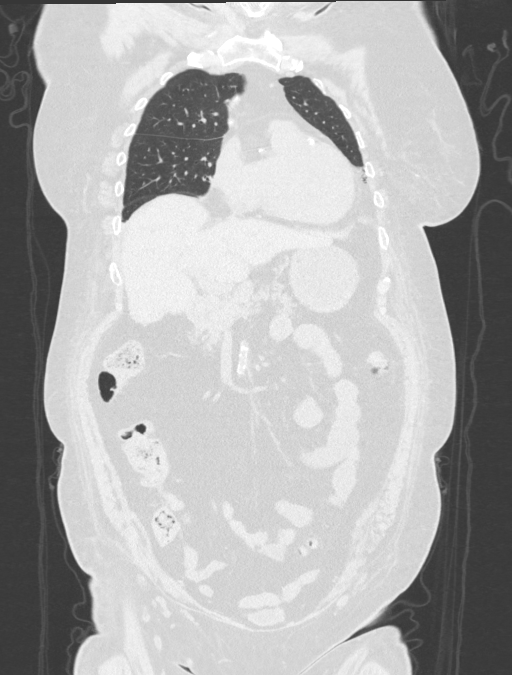
[im 97/162  lung]
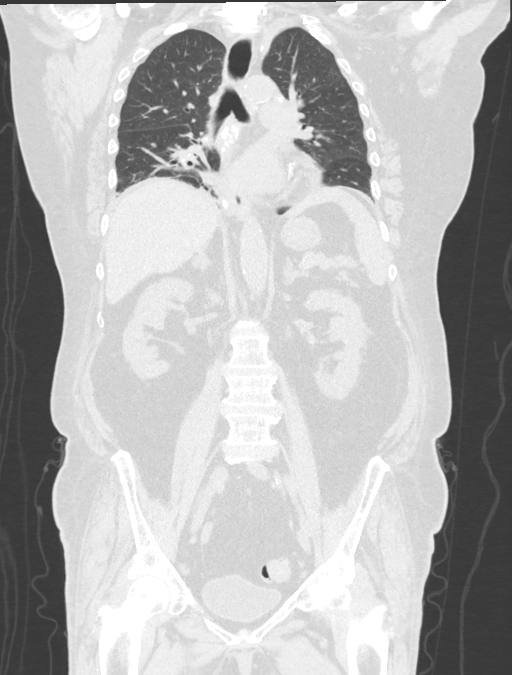

[14 of 36 positions shown; findings below may reference images not displayed]

FINDINGS: CT CHEST FINDINGS

Cardiovascular: Atherosclerotic calcifications are noted. No
aneurysmal dilatation is identified. No cardiac enlargement is seen.
Mitral valve calcifications are noted. Coronary calcifications are
seen. No pericardial effusion is noted.

Mediastinum/Nodes: Thoracic inlet is within normal limits. The
esophagus is mildly dilated with air proximally although no
definitive foreign body is seen. Multiple calcified hilar and
mediastinal lymph nodes are noted consistent with prior
granulomatous disease. No significant axillary adenopathy is noted.

Lungs/Pleura: Moderate size right-sided pleural effusion is seen.
Very mild infiltrative changes are noted in the right lower lobe.
Scattered pulmonary nodules are noted. One lies in the right lower
lobe measuring approximately 5 mm. This is 1 mm larger than that
seen in 6042 and given the difference in imaging technique likely
unchanged. Additionally a somewhat spiculated soft tissue density is
noted in the right suprahilar region best seen on image number 51 of
series 4 measuring 1.2 cm. This is also stable from the prior exam
and as well as previous exams and consistent with a benign etiology.

Musculoskeletal: Degenerative changes of the thoracic spine are
noted. Postoperative changes in the cervical spine are seen.

CT ABDOMEN PELVIS FINDINGS

Hepatobiliary: No focal liver abnormality is seen. Status post
cholecystectomy. No biliary dilatation.

Pancreas: Unremarkable. No pancreatic ductal dilatation or
surrounding inflammatory changes.

Spleen: Normal in size without focal abnormality.

Adrenals/Urinary Tract: Adrenal glands and kidneys are within normal
limits. No calculi are seen. The bladder is well distended.

Stomach/Bowel: Scattered mild diverticular changes noted. No
obstructive changes are seen. The appendix is not well visualized.
No inflammatory changes are seen. Some inspissated barium is noted
in what appears to be the central portion of the appendix. Correlate
with any surgical history. No obstructive or inflammatory changes
are seen.

Vascular/Lymphatic: Aortic atherosclerosis. No enlarged abdominal or
pelvic lymph nodes.

Reproductive: Status post hysterectomy. No adnexal masses.

Other: No abdominal wall hernia or abnormality. No abdominopelvic
ascites.

Musculoskeletal: Degenerative changes of the lumbar spine are noted.
IMPRESSION: Changes consistent with prior granulomatous disease.

Right-sided pleural effusion and mild right lower lobe infiltrate.

Stable pulmonary nodules as described above consistent with benign
etiology.

Chronic changes without acute abnormality within the abdomen and
pelvis.

## 2019-09-15 IMAGING — CR DG CHEST 2V
2 series · 2 of 2 positions shown · non-contrast
Comparison: 12/24/2016

CLINICAL DATA: Chest pain

EXAM:
CHEST  2 VIEW

[chest lat]
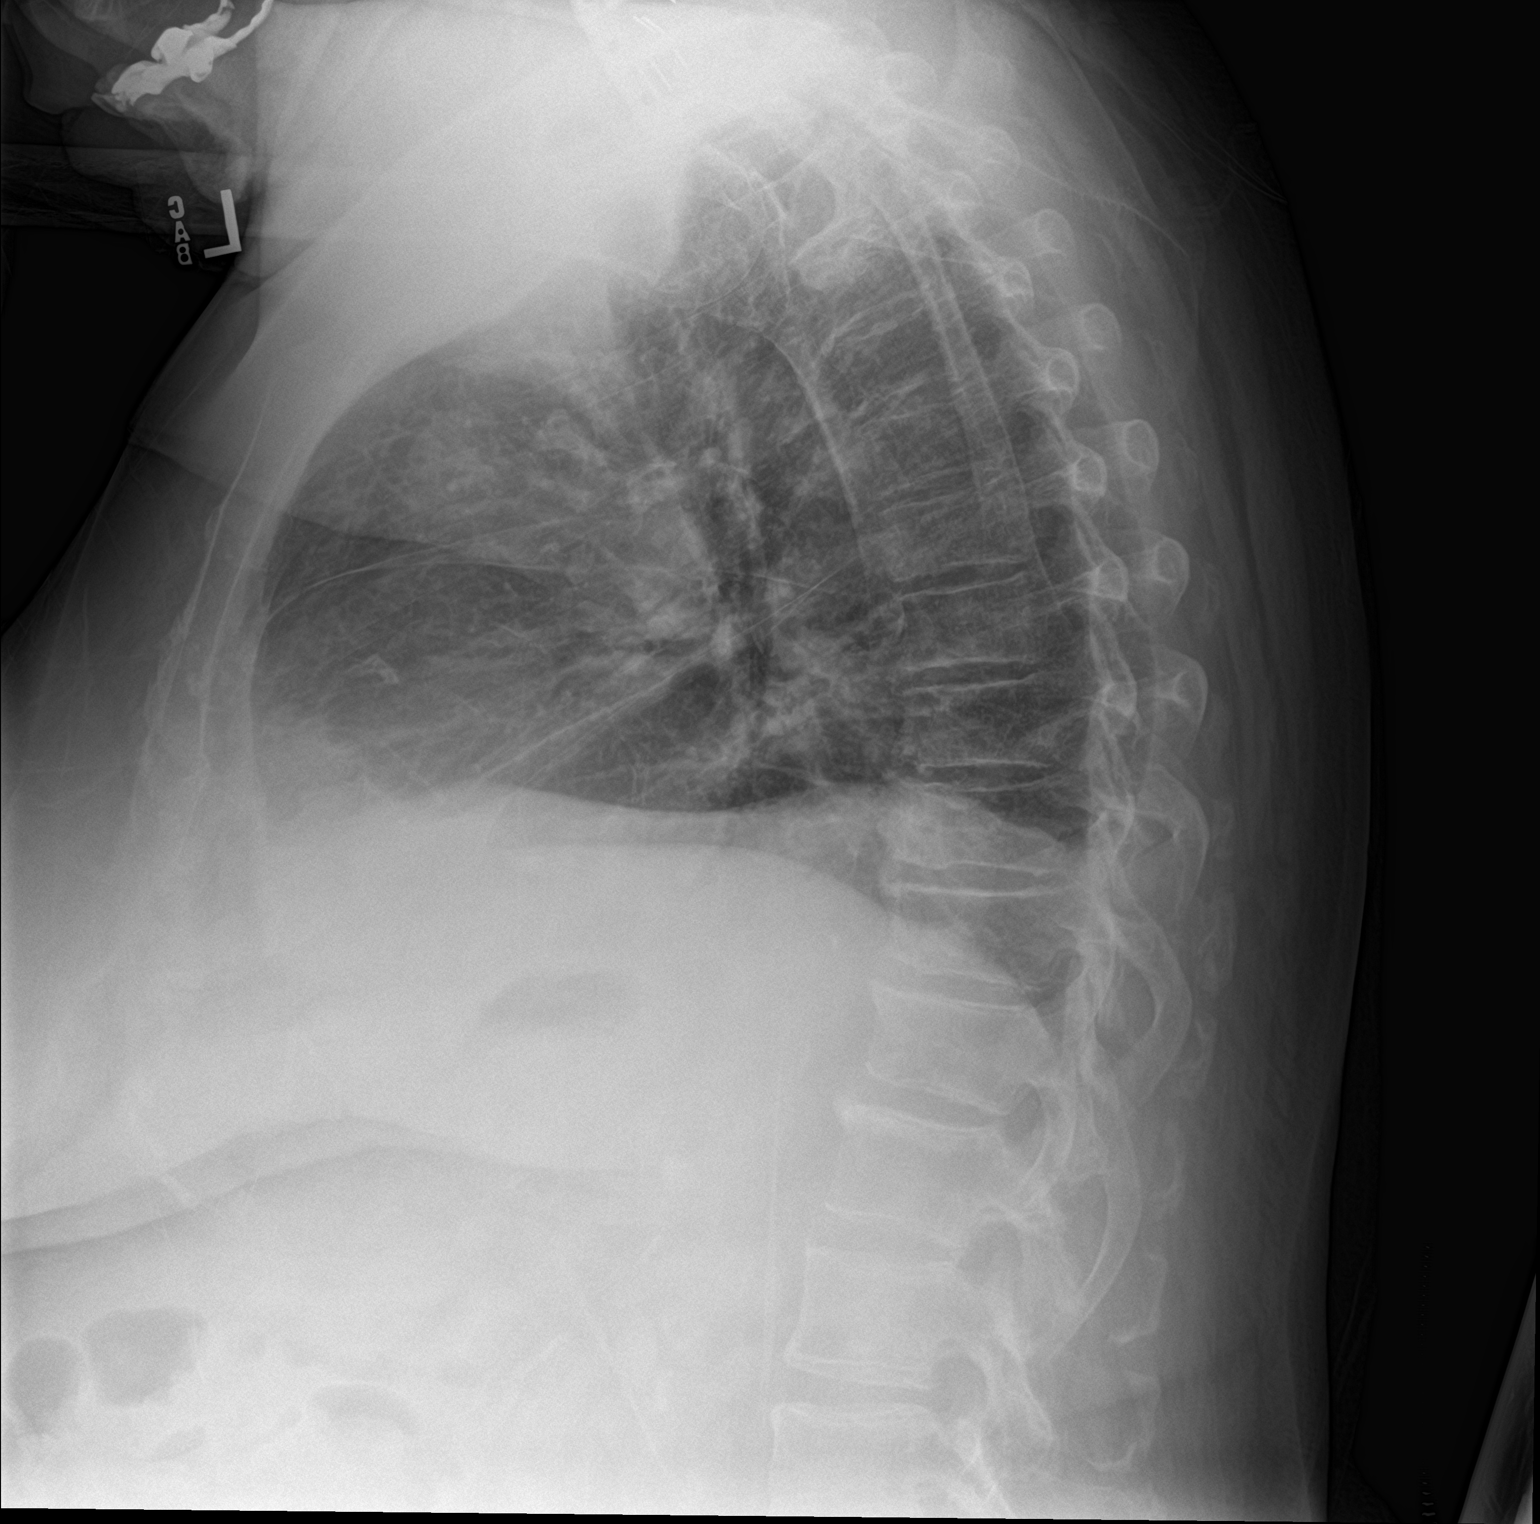

[chest ap]
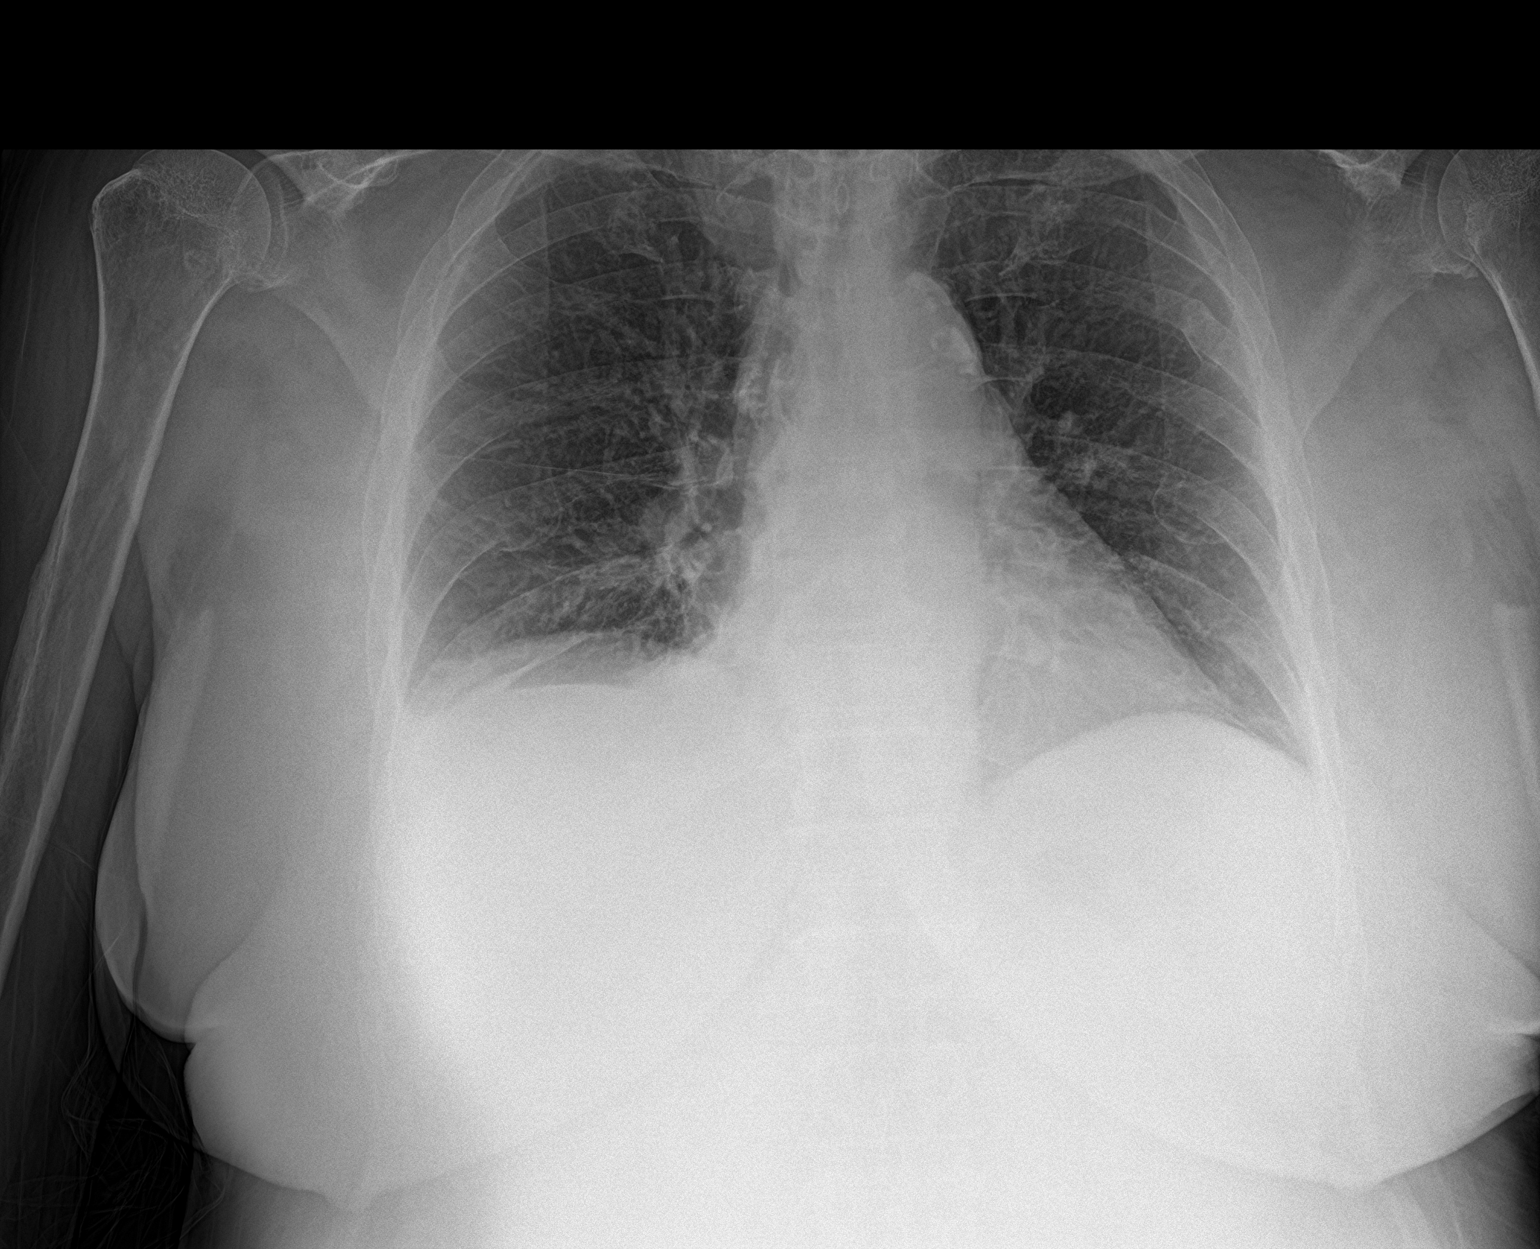

[2 of 2 positions shown; findings below may reference images not displayed]

FINDINGS: Low lung volumes. Bibasilar subsegmental atelectasis. Small right
pleural effusion is suspected. Interstitial prominence. Normal
vascularity. Chronic left rib deformities.
IMPRESSION: Bibasilar atelectasis

Small right pleural effusion.

## 2019-09-26 IMAGING — CR DG CHEST 2V
2 series · 2 of 2 positions shown · non-contrast
Comparison: 05/23/2017; 12/24/2016; 05/16/2016; chest CT-05/23/2017

CLINICAL DATA: Cough and congestion. Left-sided chest pain patient
was recently diagnosed with pneumonia.

EXAM:
CHEST  2 VIEW

[chest pa]
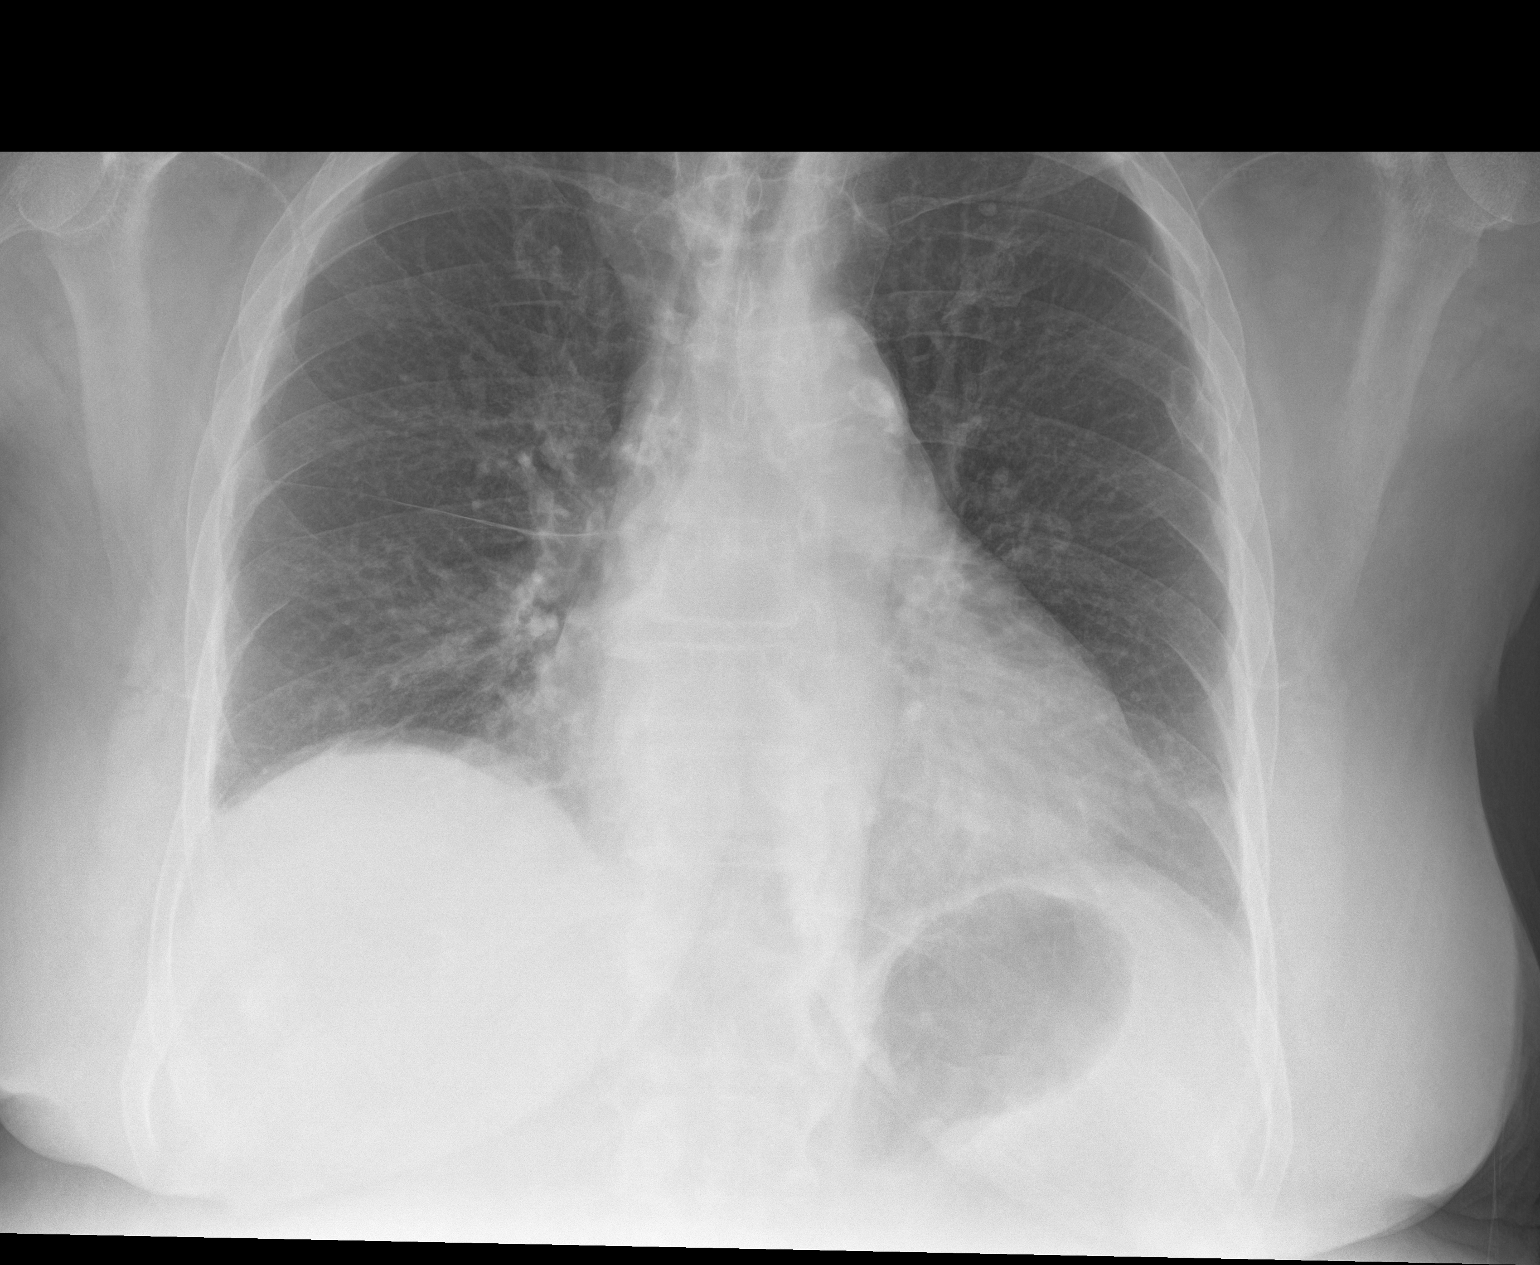

[chest lat]
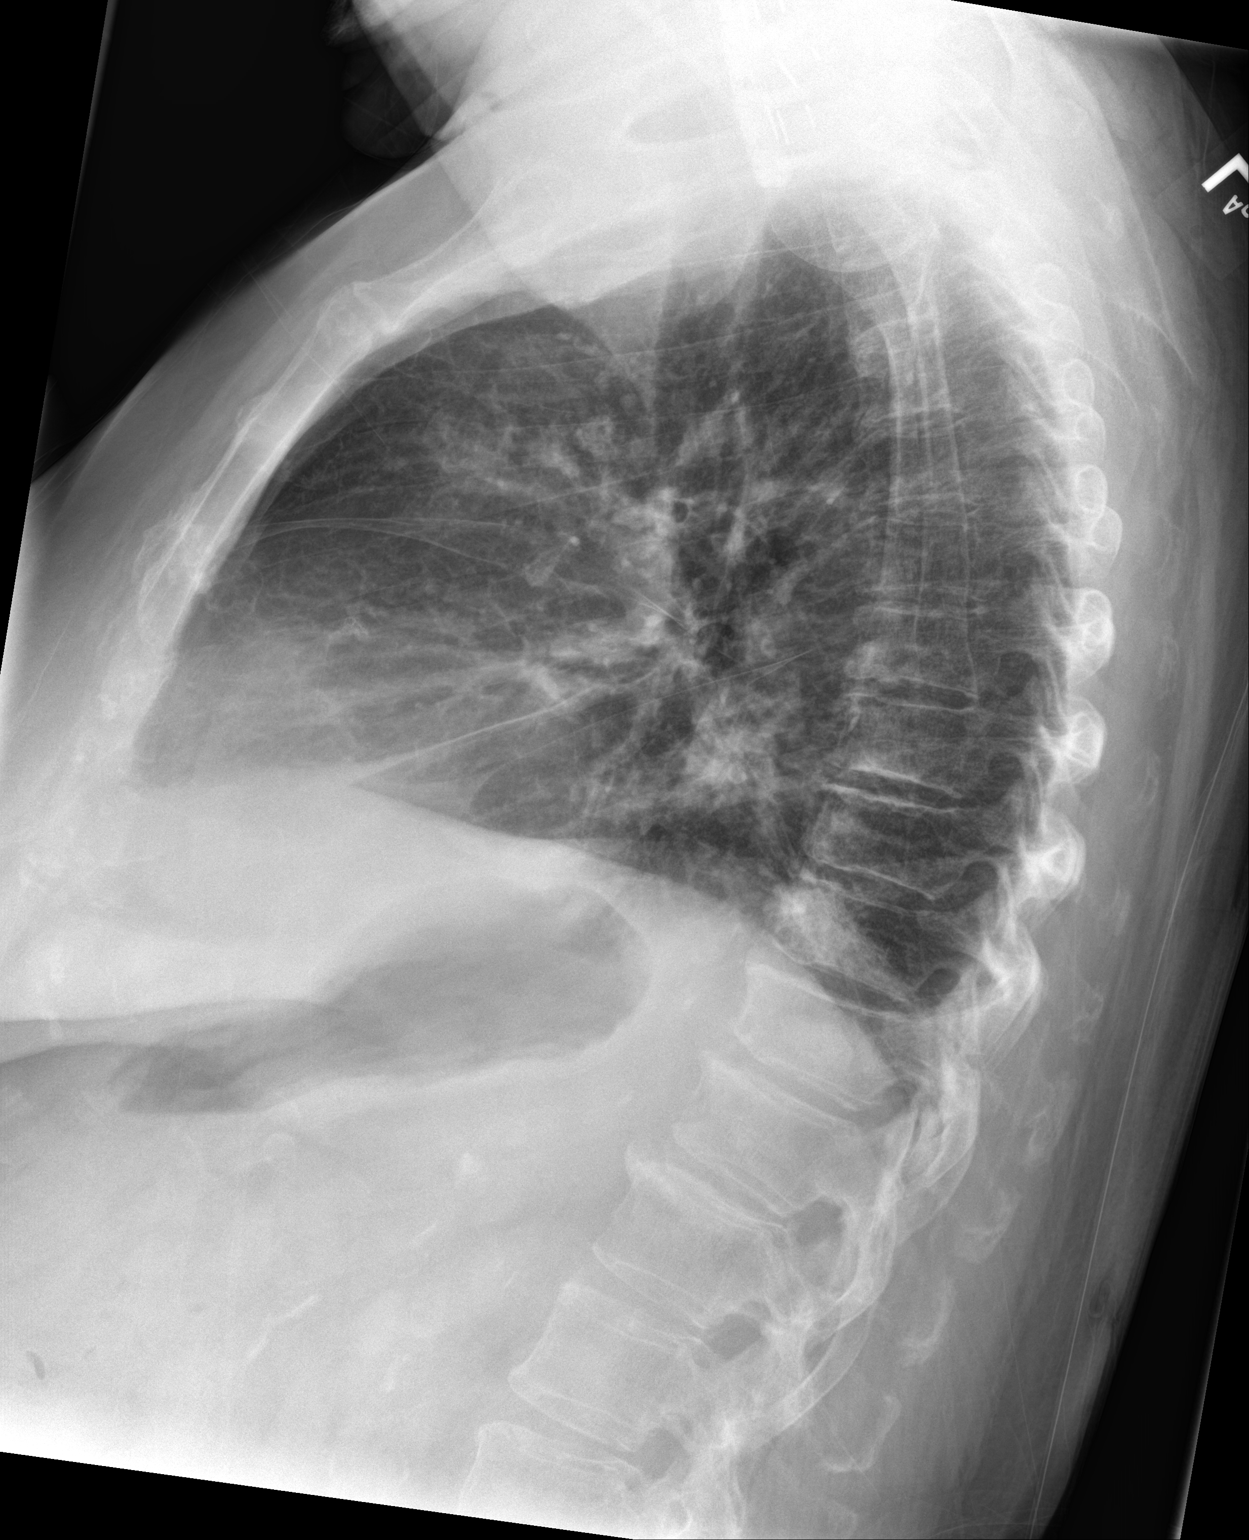

[2 of 2 positions shown; findings below may reference images not displayed]

FINDINGS: Grossly unchanged cardiac silhouette and mediastinal contours with
partially calcified bilateral hilar and mediastinal lymph nodes.
Overall improved aeration of the lungs with persistent bilateral
infrahilar opacities favored to represent atelectasis. Suspected
reduction in residual trace right-sided pleural effusion. No
definite left-sided pleural effusion. No pneumothorax. There is a
minimal amount of pleuroparenchymal thickening about the right minor
fissure, unchanged. No evidence of edema.

No acute osseous abnormalities. Post lower cervical ACDF,
incompletely evaluated. Old fracture involving the posterolateral
aspect the left fifth rib. Mild (approximately 25%) compression
deformity involving the superior endplates of the T11 and T12
vertebral bodies, unchanged.
IMPRESSION: Overall improved aeration of lungs with suspected trace residual
right-sided effusion and associated bibasilar opacities, likely
atelectasis.

## 2019-09-26 IMAGING — CT CT ABD-PELV W/O CM
2 of 4 series · 14 of 36 positions shown, 17 images · non-contrast
Comparison: Radiograph 06/03/2017, CT chest abdomen pelvis
05/23/2017, CT chest 07/11/2015, 01/11/2015, 01/24/2014

CLINICAL DATA: Weakness and cough

EXAM:
CT CHEST, ABDOMEN AND PELVIS WITHOUT CONTRAST
TECHNIQUE: Multidetector CT imaging of the chest, abdomen and pelvis was
performed following the standard protocol without IV contrast.

[Series 2: cap wo st · axial · 0.88mm/px · z∈[-1152,-618]mm · 11 of 127 slices shown, 14 images]
[im 10/127  mediastinal]
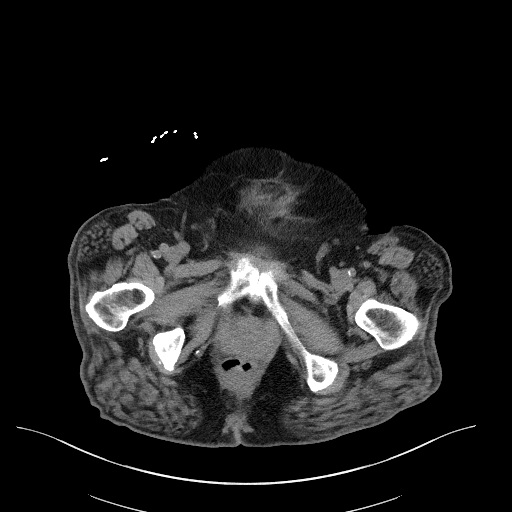
[im 10/127  lung]
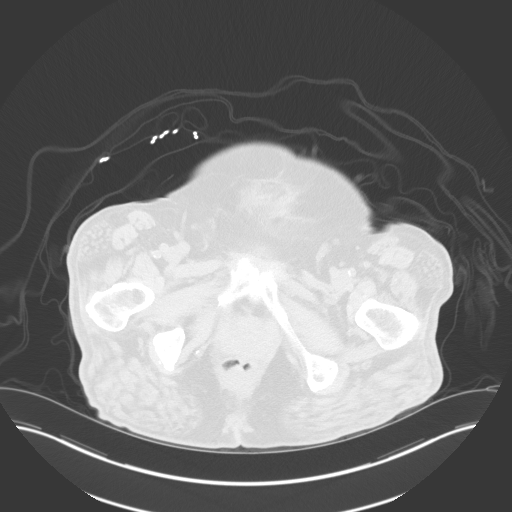
[im 20/127  lung]
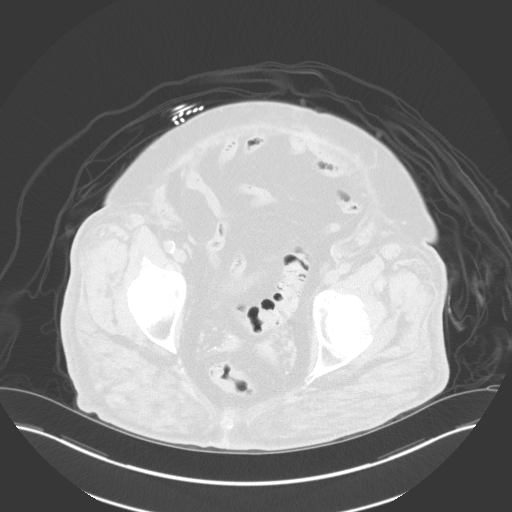
[im 30/127  lung]
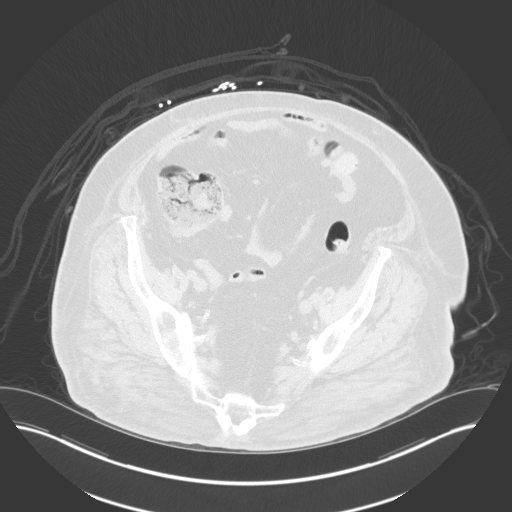
[im 39/127  lung]
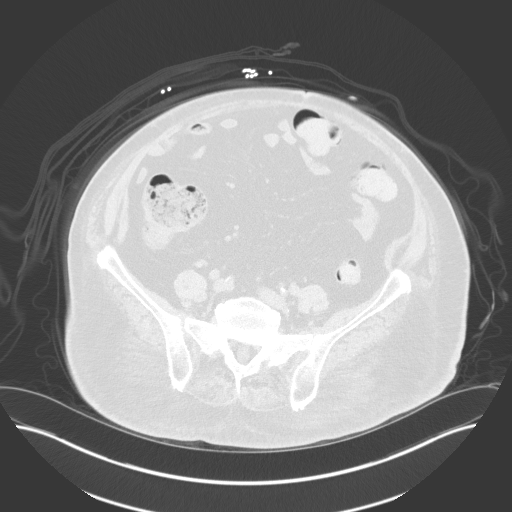
[im 49/127  mediastinal]
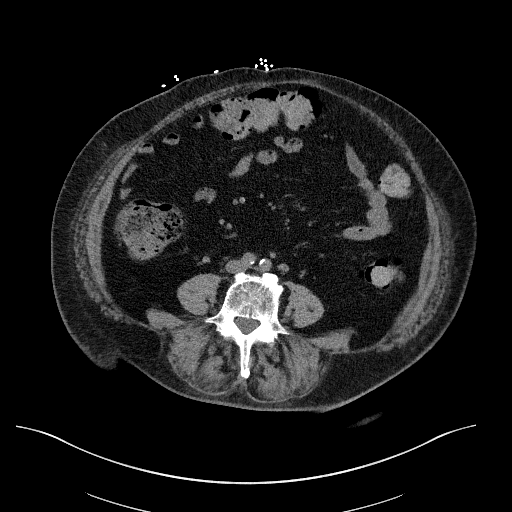
[im 49/127  lung]
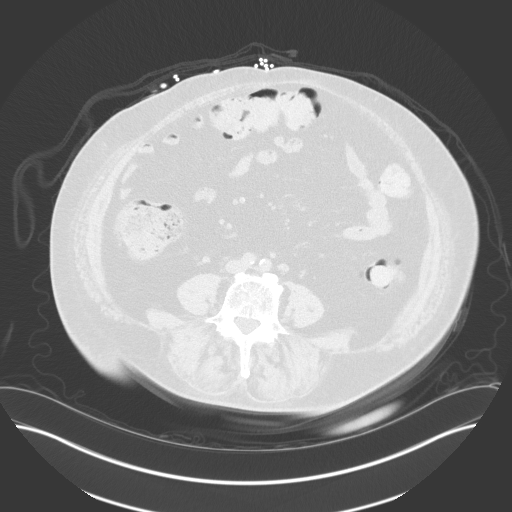
[im 68/127  lung]
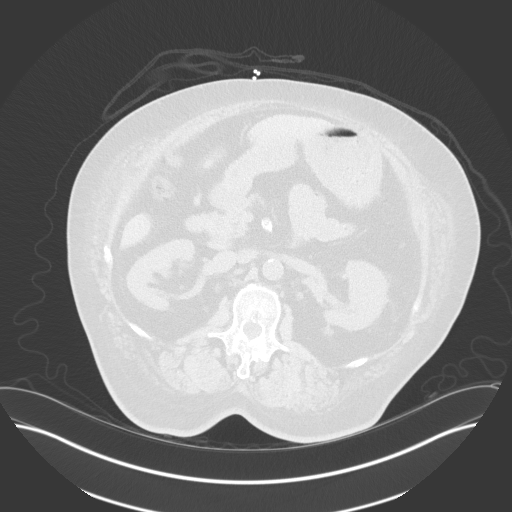
[im 78/127  lung]
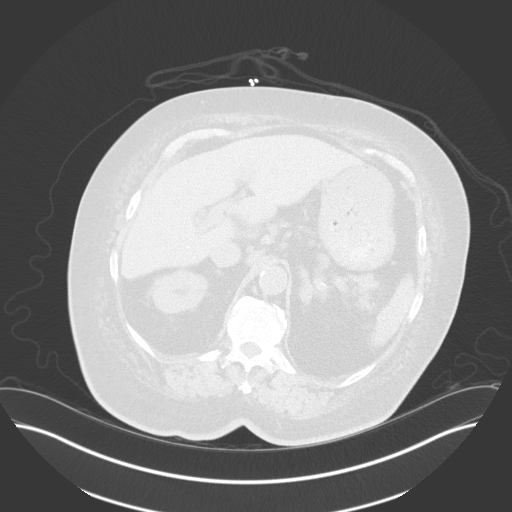
[im 88/127  lung]
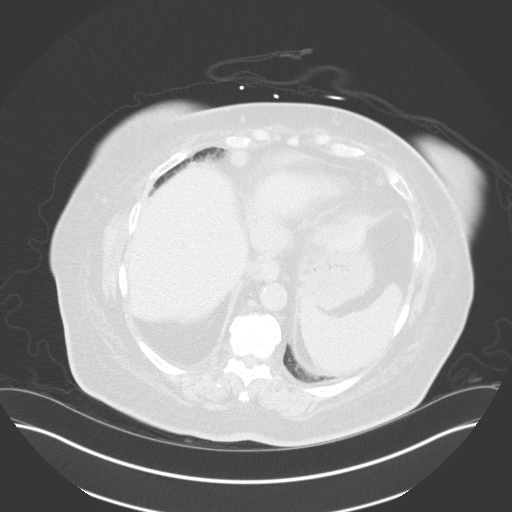
[im 97/127  mediastinal]
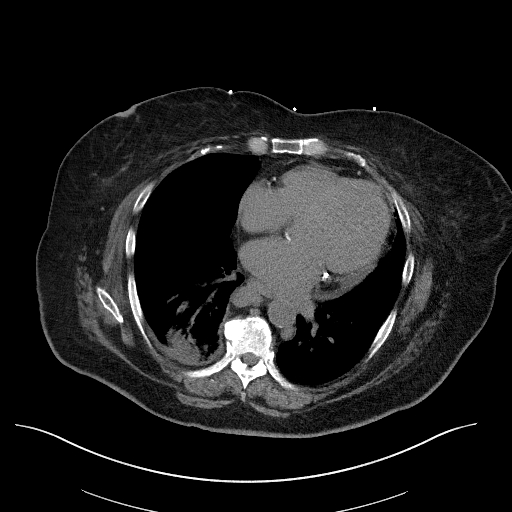
[im 97/127  lung]
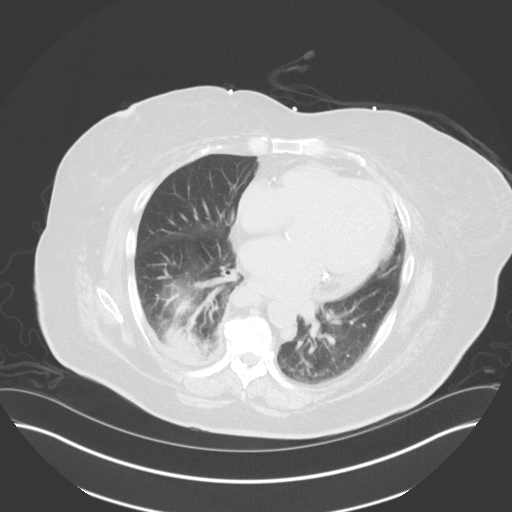
[im 107/127  lung]
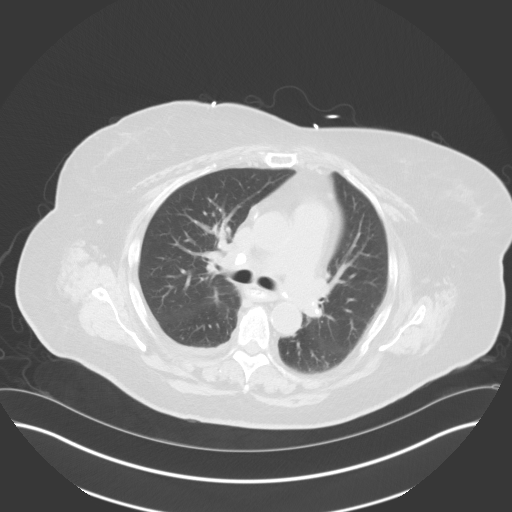
[im 117/127  lung]
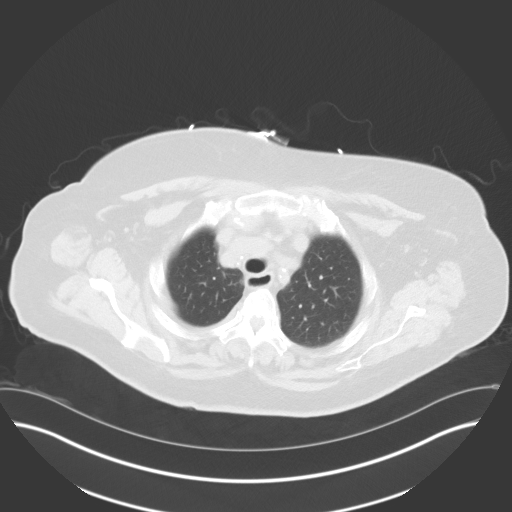

[Series 5: coronal · coronal · 0.87mm/px · 3 of 160 slices shown]
[im 32/160  lung]
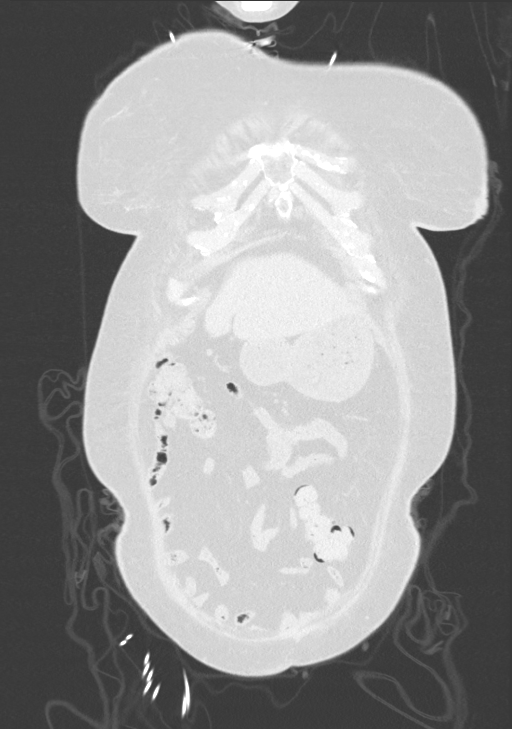
[im 64/160  lung]
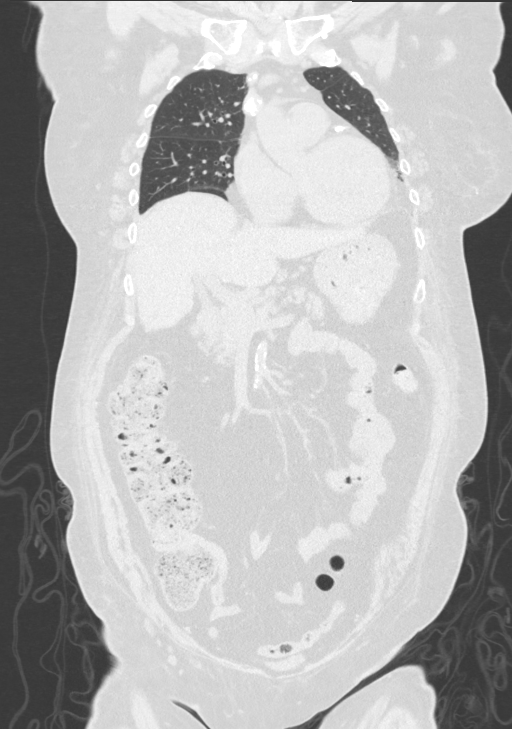
[im 96/160  lung]
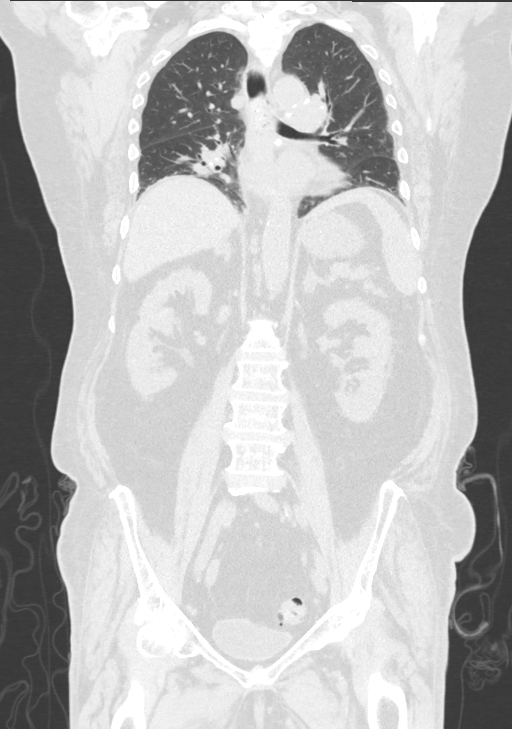

[14 of 36 positions shown; findings below may reference images not displayed]

FINDINGS: CT CHEST FINDINGS

Cardiovascular: Limited evaluation without intravenous contrast.
Moderate aortic atherosclerosis. No aneurysmal dilatation. Coronary
vessel calcification. Normal heart size. Trace pericardial effusion

Mediastinum/Nodes: Midline trachea. No thyroid mass. Mild air
distention of the esophagus. Multiple calcified mediastinal and
hilar lymph nodes consistent with prior granulomatous disease.

Lungs/Pleura: Small right-sided pleural effusion, decreased compared
to prior. Hazy atelectasis or resolving infiltrate at the right
base. Bilateral pulmonary nodules again visualized. Stable 11 mm
right upper lobe lung nodule since 4499, therefore felt benign. No
new infiltrate.

Musculoskeletal: Degenerative changes. No acute or suspicious
lesion.

CT ABDOMEN PELVIS FINDINGS

Hepatobiliary: No focal liver abnormality is seen. Status post
cholecystectomy. No biliary dilatation.

Pancreas: Unremarkable. No pancreatic ductal dilatation or
surrounding inflammatory changes.

Spleen: Normal in size without focal abnormality.

Adrenals/Urinary Tract: Adrenal glands are unremarkable. Kidneys are
normal, without renal calculi, focal lesion, or hydronephrosis.
Bladder is unremarkable.

Stomach/Bowel: Stomach is within normal limits. Appendix not well
seen but no right lower quadrant inflammatory process. No evidence
of bowel wall thickening, distention, or inflammatory changes.

Vascular/Lymphatic: Moderate aortic atherosclerosis. No aneurysmal
dilatation. Stable small retroperitoneal lymph nodes. Stable right
cardio phrenic lymph nodes.

Reproductive: Status post hysterectomy. No adnexal masses.

Other: Negative for free air or free fluid. Small fat in the
umbilical region

Musculoskeletal: No acute or suspicious lesions.
IMPRESSION: 1. Small right pleural effusion, decreased compared to most recent
CT. Minimal hazy atelectasis or resolving infiltrate in the right
lower lobe.
2. Stable bilateral pulmonary nodules including an 11 mm right upper
lobe lung nodule, felt benign
3. No CT evidence for acute intra-abdominal or pelvic abnormality.
4. Mild sigmoid colon diverticular disease without acute
inflammation.

## 2019-09-26 IMAGING — CR DG CHEST 2V
1 series · 2 of 2 positions shown · non-contrast
Comparison: Earlier same day; 05/23/2017; chest CT-05/23/2017

CLINICAL DATA: Cough and weakness. Recently hospitalized with
pneumonia.

EXAM:
CHEST  2 VIEW

[Series 2: w chest lat · 0.14mm/px · 2 of 2 slices shown]
[im 1/2]
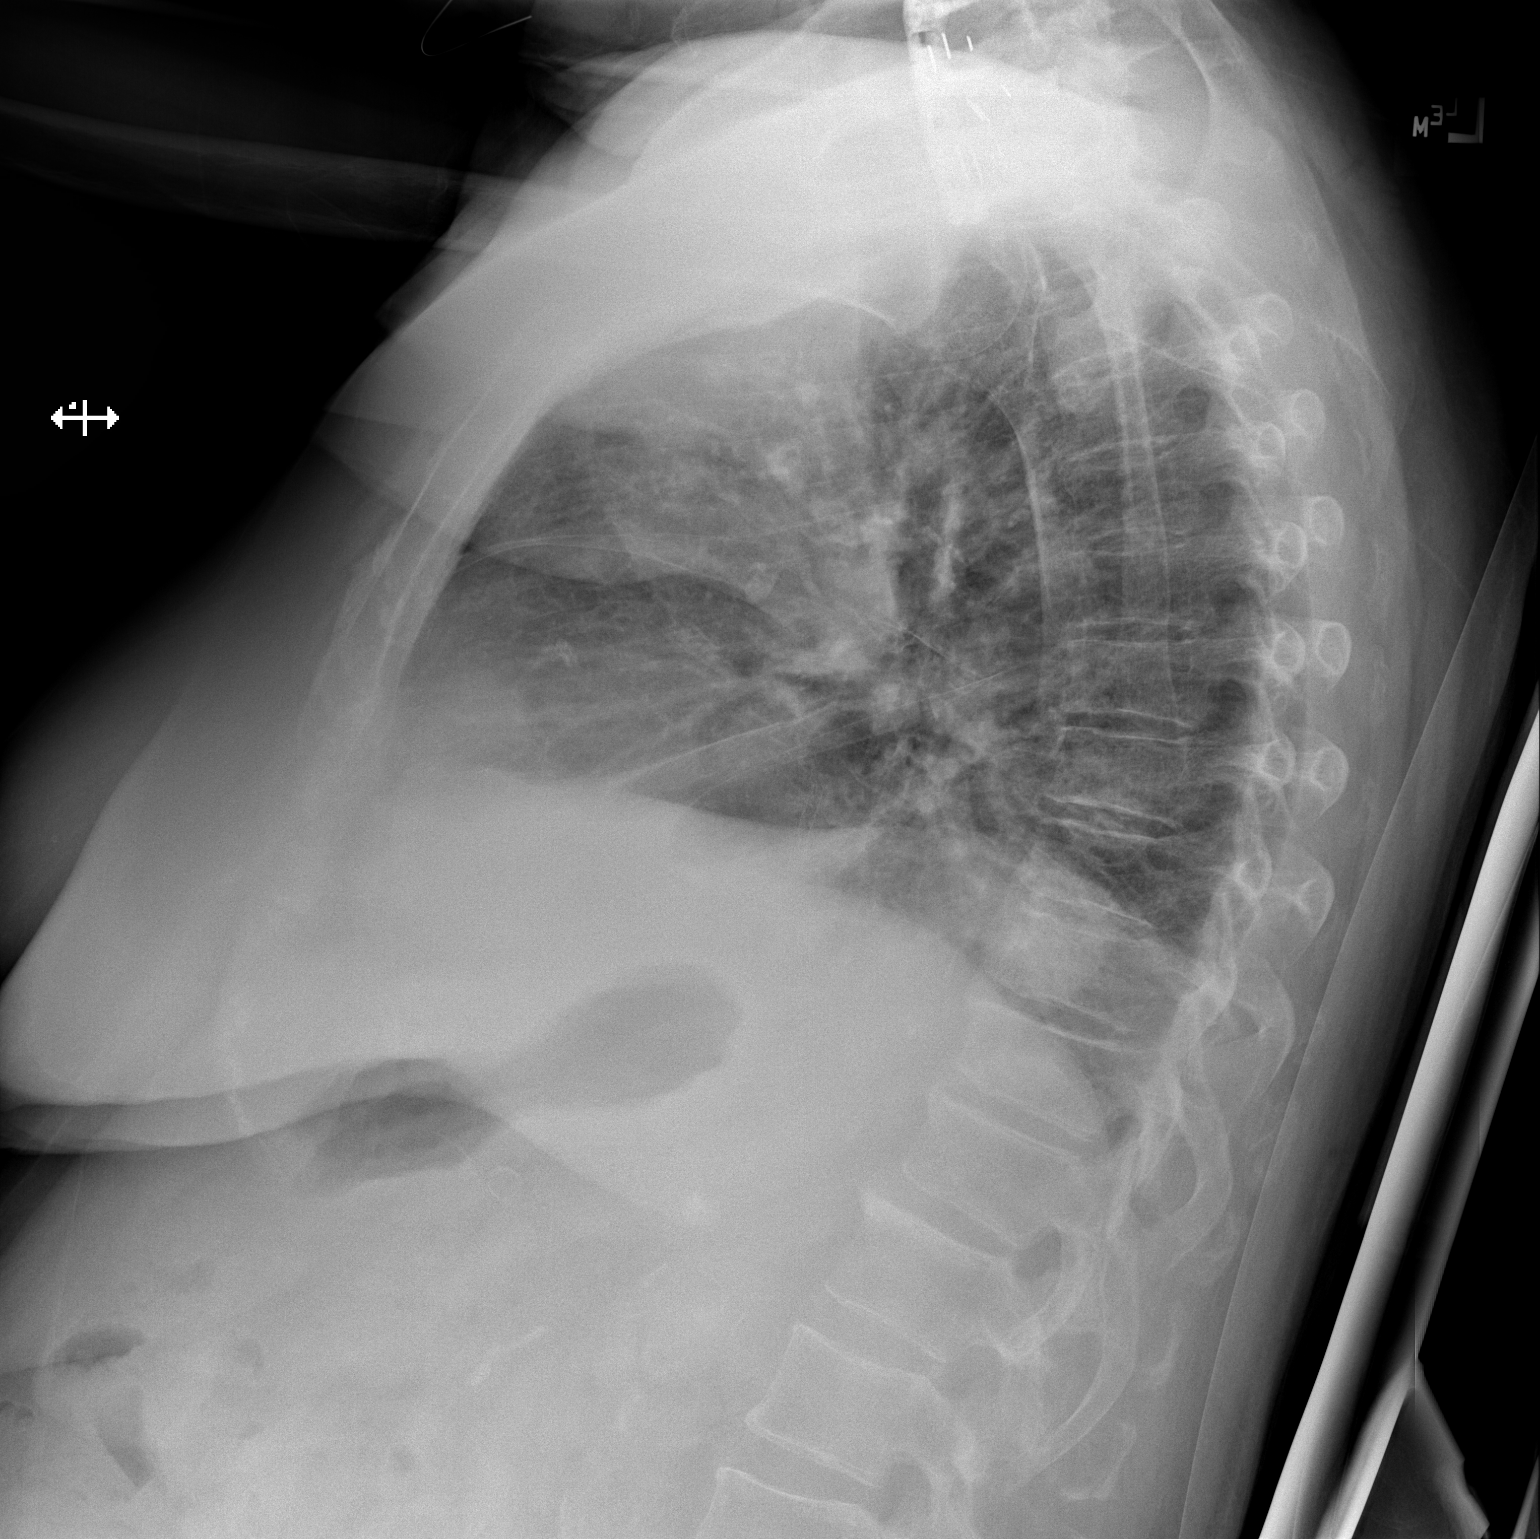
[im 2/2]
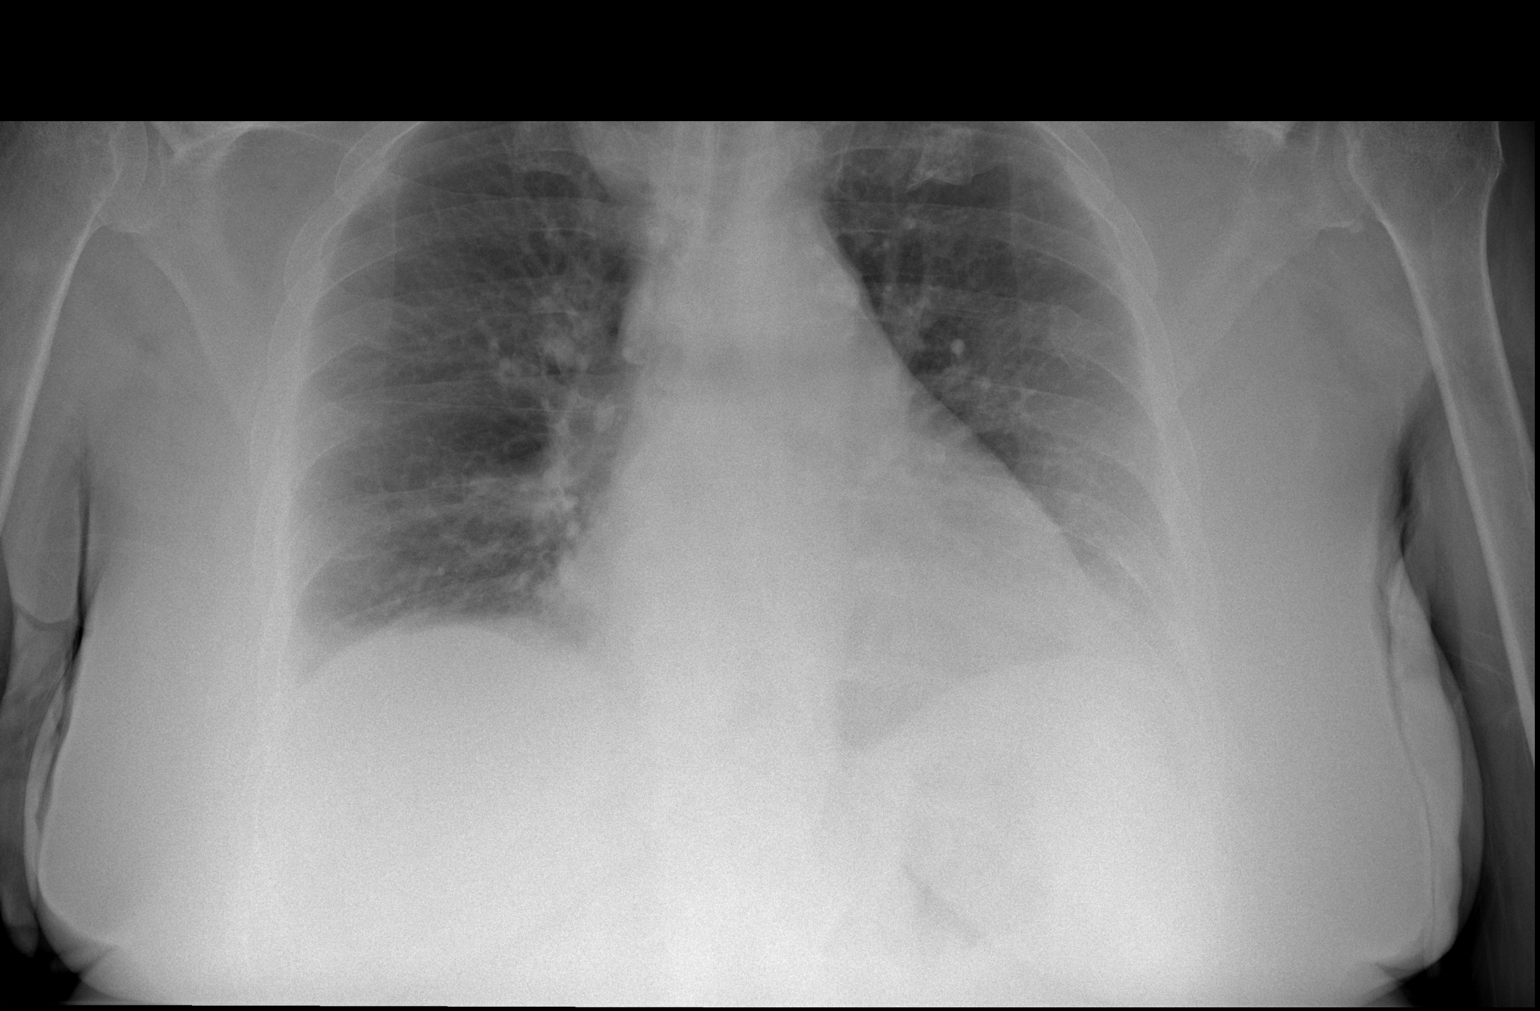

[2 of 2 positions shown; findings below may reference images not displayed]

FINDINGS: Unchanged cardiac silhouette and mediastinal contours. Slightly
reduced lung volumes with associated bibasilar opacities, likely
atelectasis. No pleural effusion or pneumothorax. No evidence of
edema. There is minimal pleuroparenchymal thickening about the right
minor and bilateral major fissures, unchanged. No acute osseus
abnormalities. Post lower cervical ACDF. Post cholecystectomy.
IMPRESSION: Bibasilar atelectasis without superimposed acute cardiopulmonary
disease. Specifically, no discrete focal airspace opacities to
suggest pneumonia.

## 2019-10-30 IMAGING — CR DG CHEST 2V
1 series · 2 of 2 positions shown · non-contrast
Comparison: Chest x-ray of [DATE][REDACTED] 3715 and chest CT scan of
the same date

CLINICAL DATA: Pleural effusion, cough, history of coronary artery
disease with stent placement, hypertension, sarcoidosis, diabetes.

EXAM:
CHEST  2 VIEW

[Series 1: dg chest 2 view · 0.14mm/px · 2 of 2 slices shown]
[im 1/2]
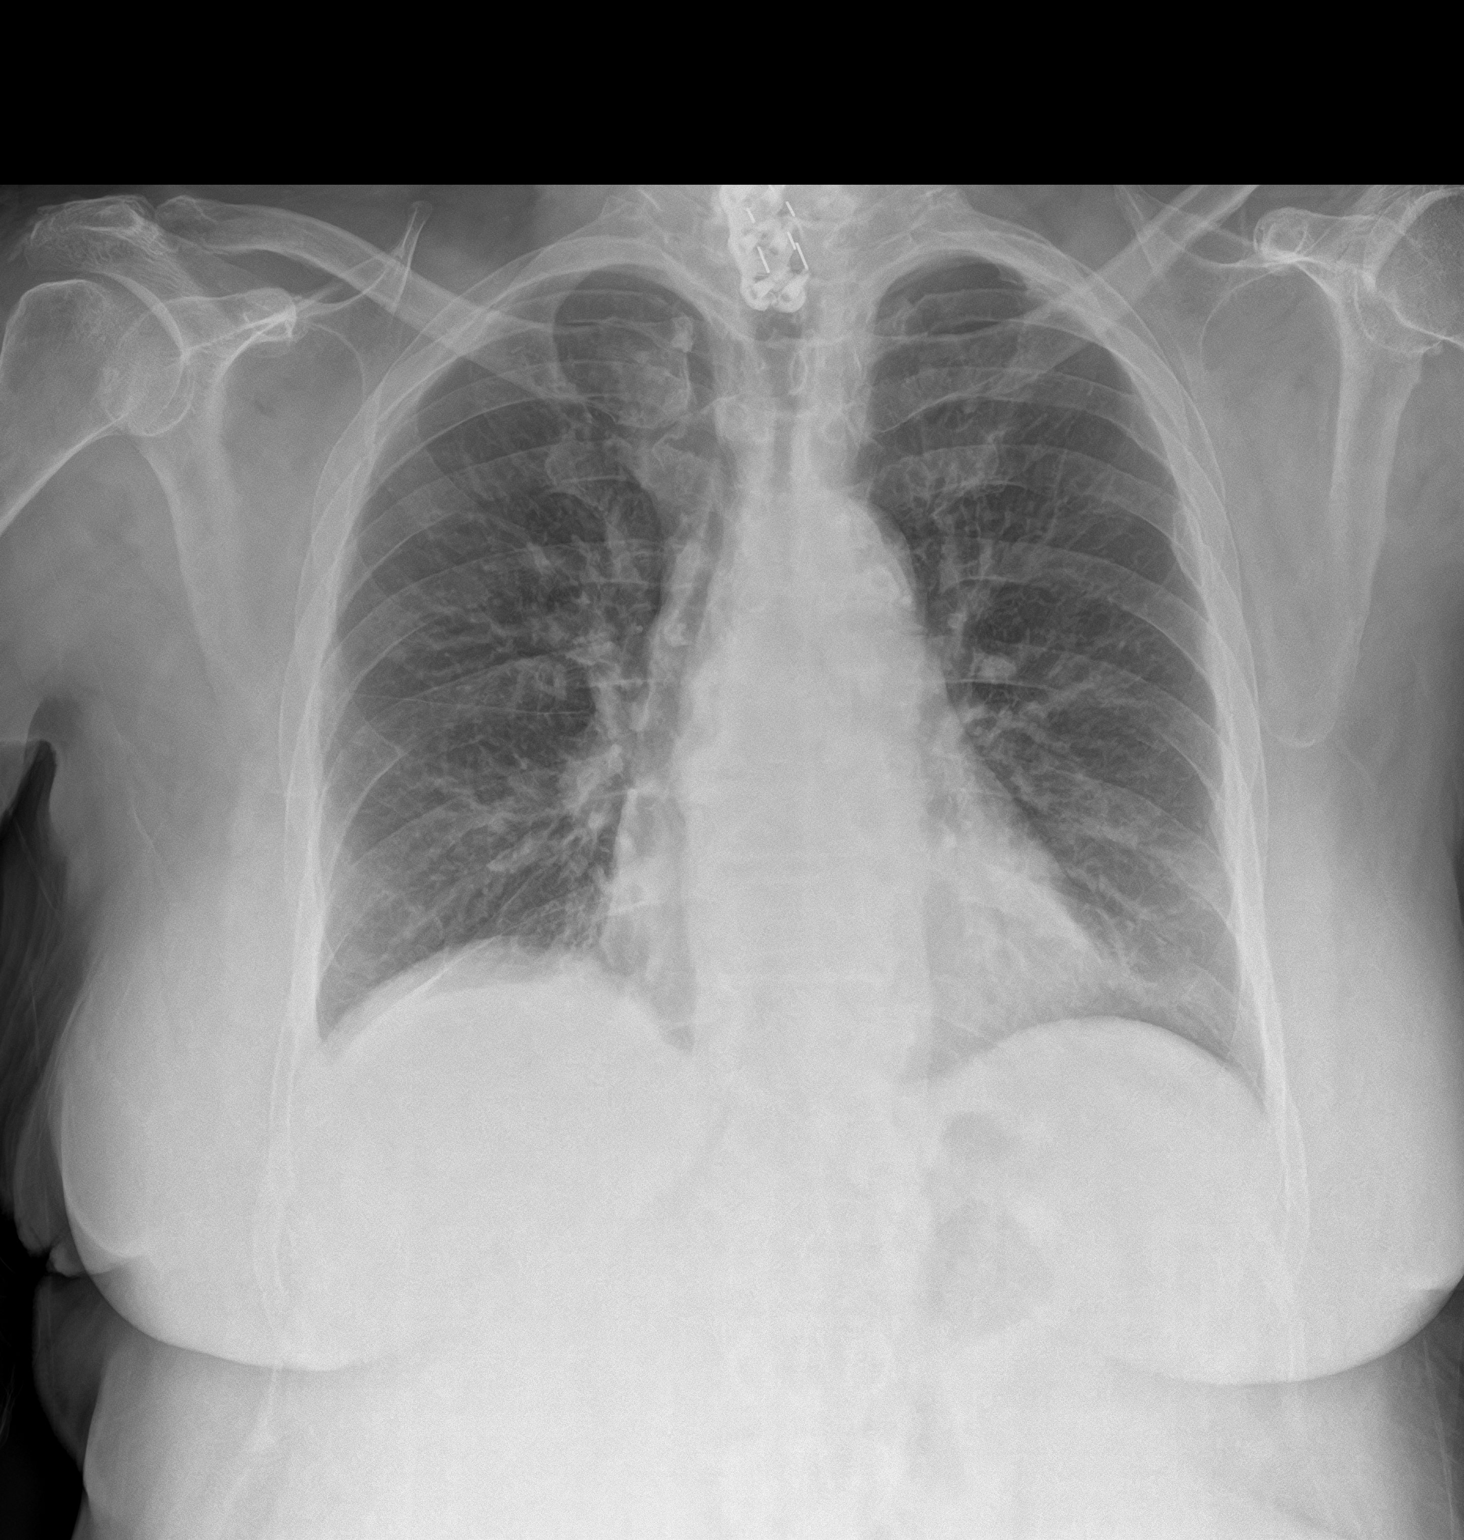
[im 2/2]
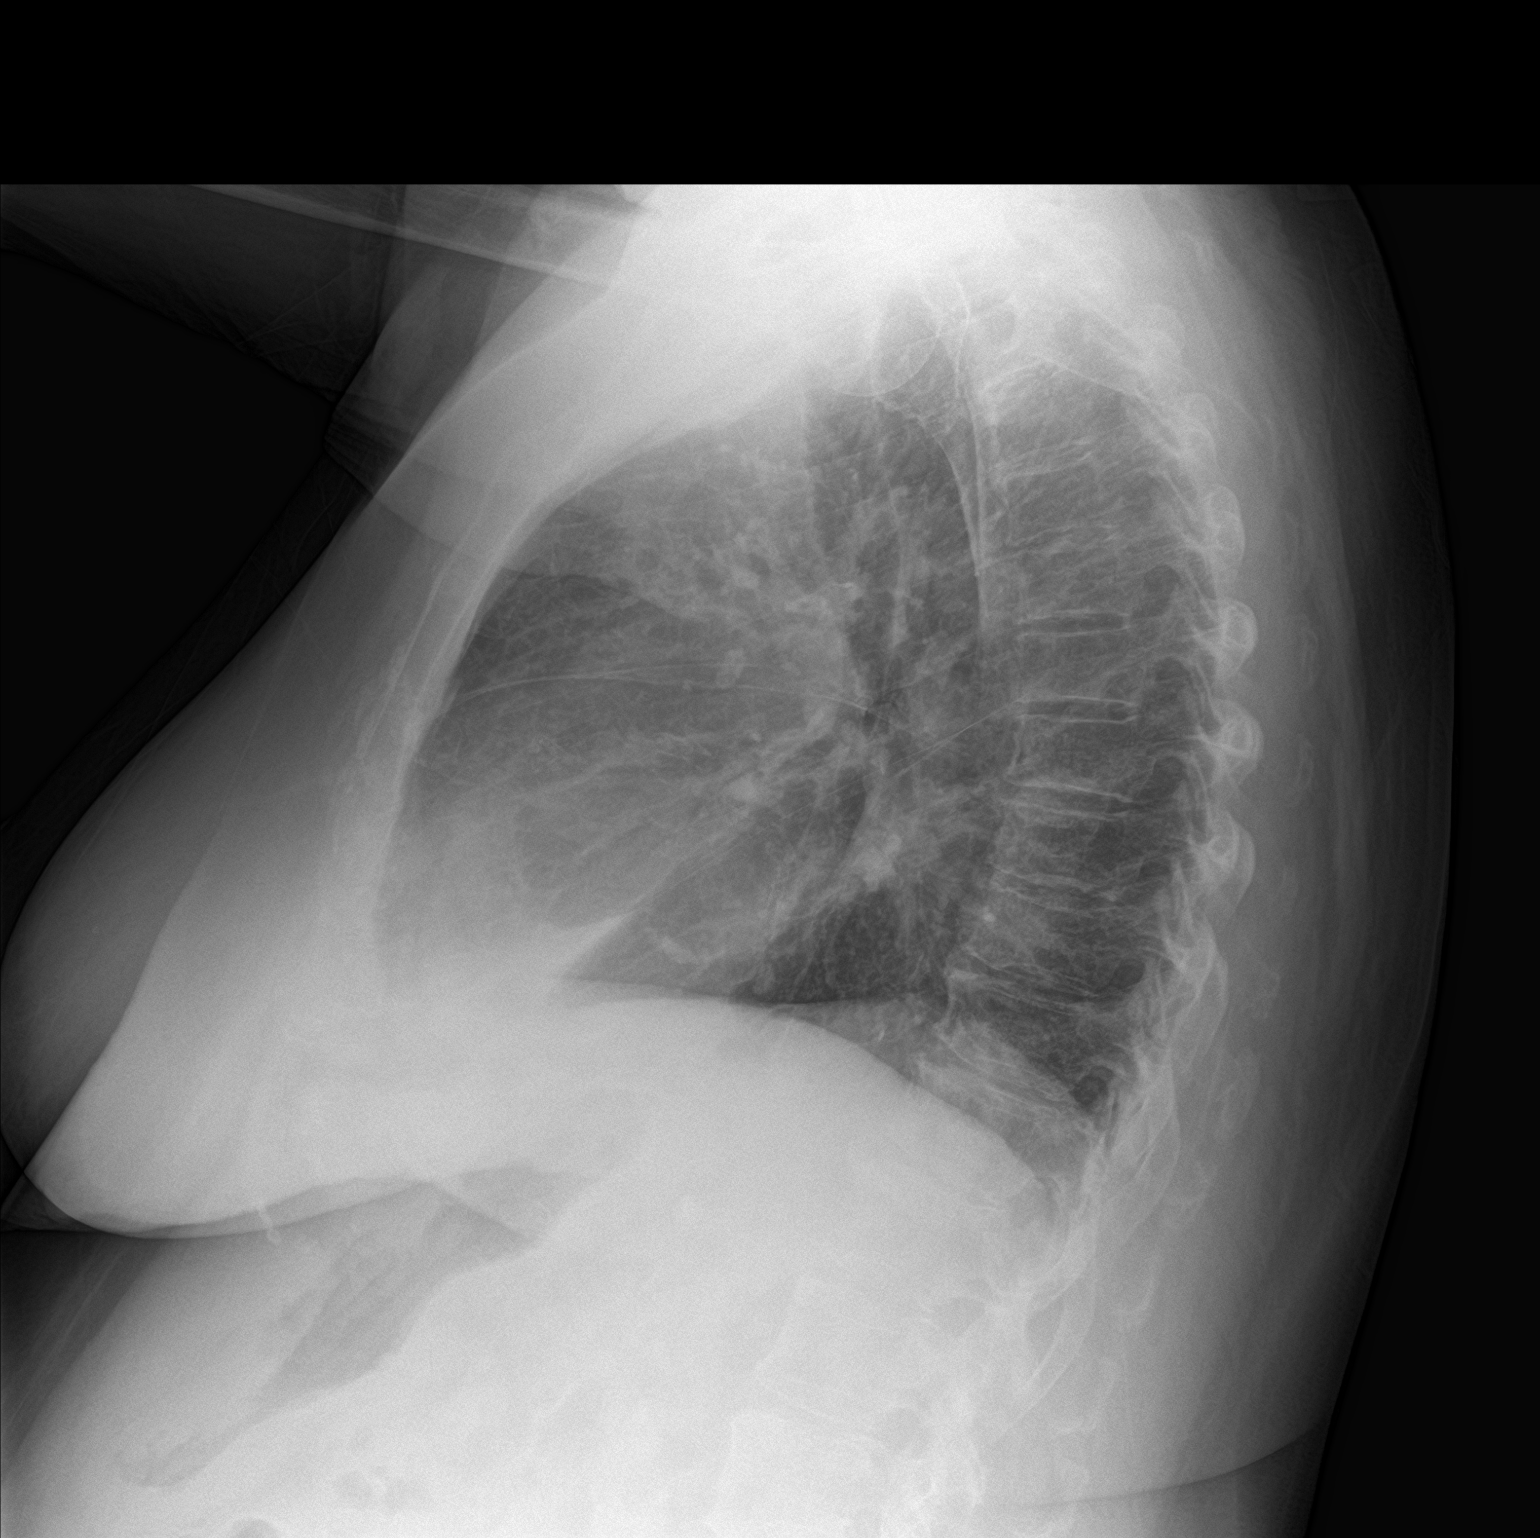

[2 of 2 positions shown; findings below may reference images not displayed]

FINDINGS: The lungs are adequately inflated. The interstitial markings are
coarse. There is no alveolar infiltrate. No pleural effusion is
evident today. The heart and pulmonary vascularity are normal. There
is calcification in the wall of the aortic arch. Old rib deformities
laterally on the left are observed. There is mild degenerative disc
disease at multiple lower thoracic levels.
IMPRESSION: Chronic bronchitic changes. Some of the interstitial findings may
reflect known sarcoidosis. No alveolar pneumonia, CHF, or pleural
effusion.

Thoracic aortic atherosclerosis.

## 2019-11-17 ENCOUNTER — Telehealth: Payer: Self-pay

## 2019-11-17 NOTE — Telephone Encounter (Signed)
Pt has moved to [place].

## 2019-12-07 IMAGING — DX DG HIP (WITH OR WITHOUT PELVIS) 2-3V*R*
3 series · 3 of 3 positions shown · non-contrast
Comparison: CT, 06/03/2017

CLINICAL DATA: Right buttock pain following a fall 3 weeks ago.

EXAM:
DG HIP (WITH OR WITHOUT PELVIS) 2-3V RIGHT

[pelvis ap]
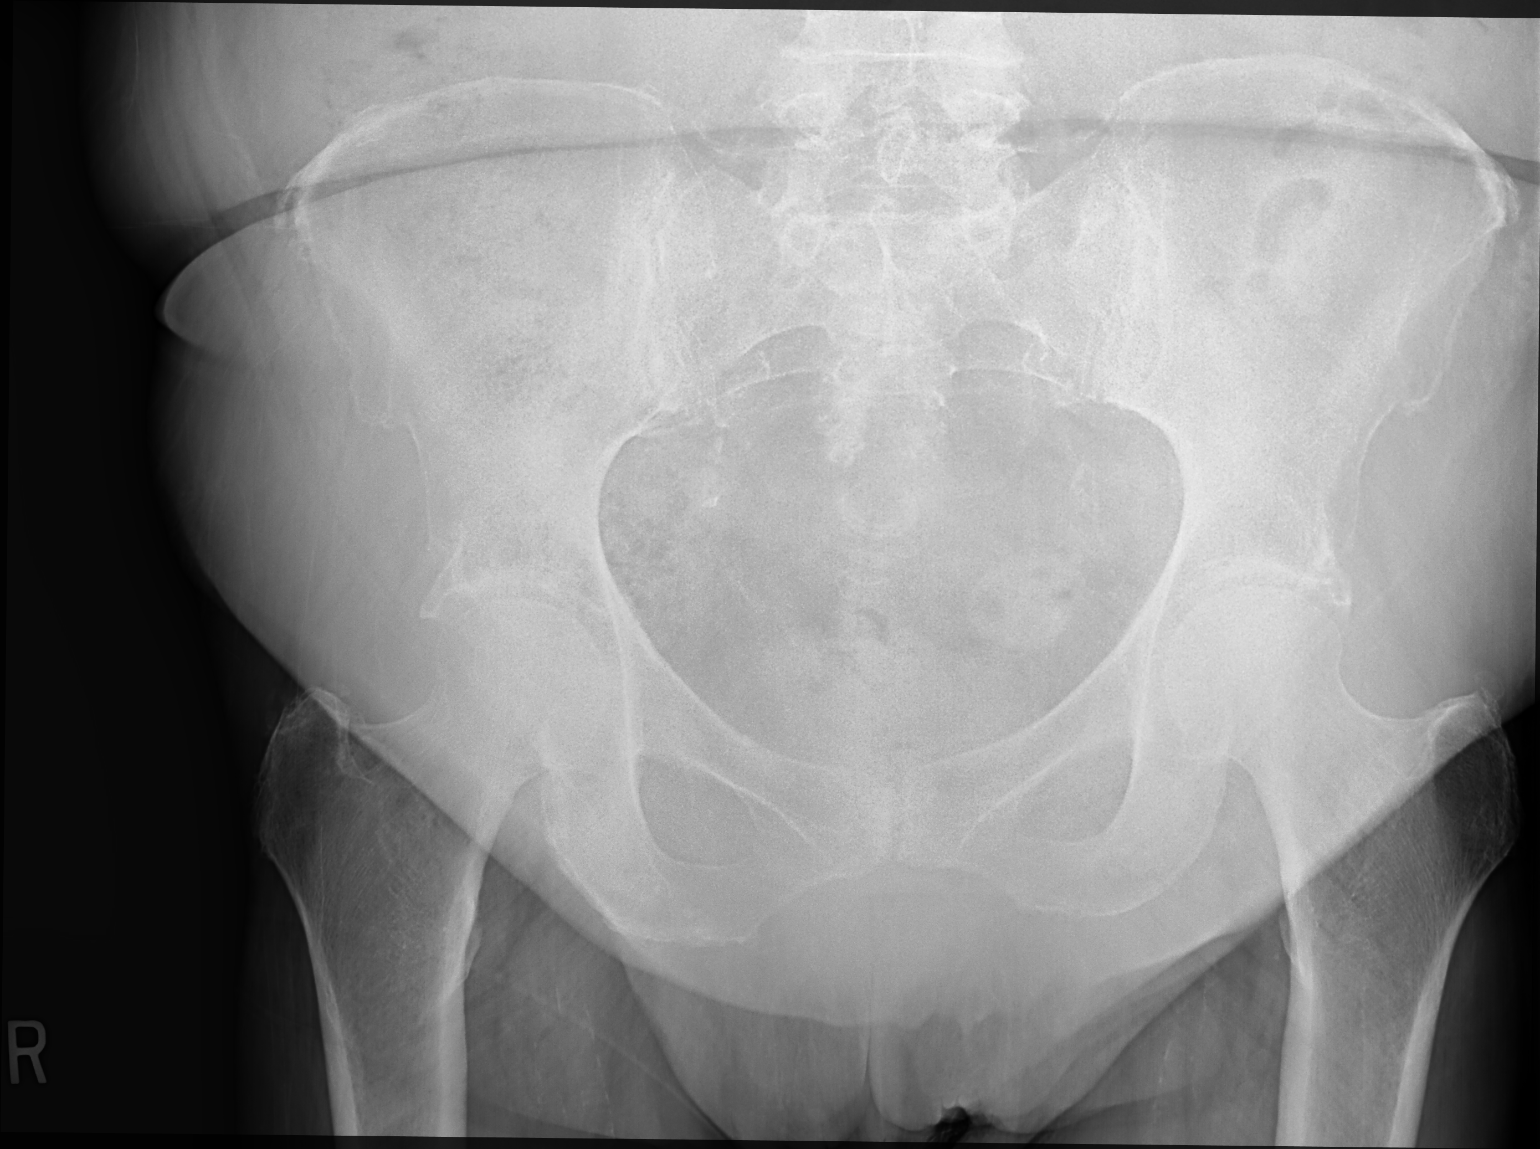

[hip joint ap]
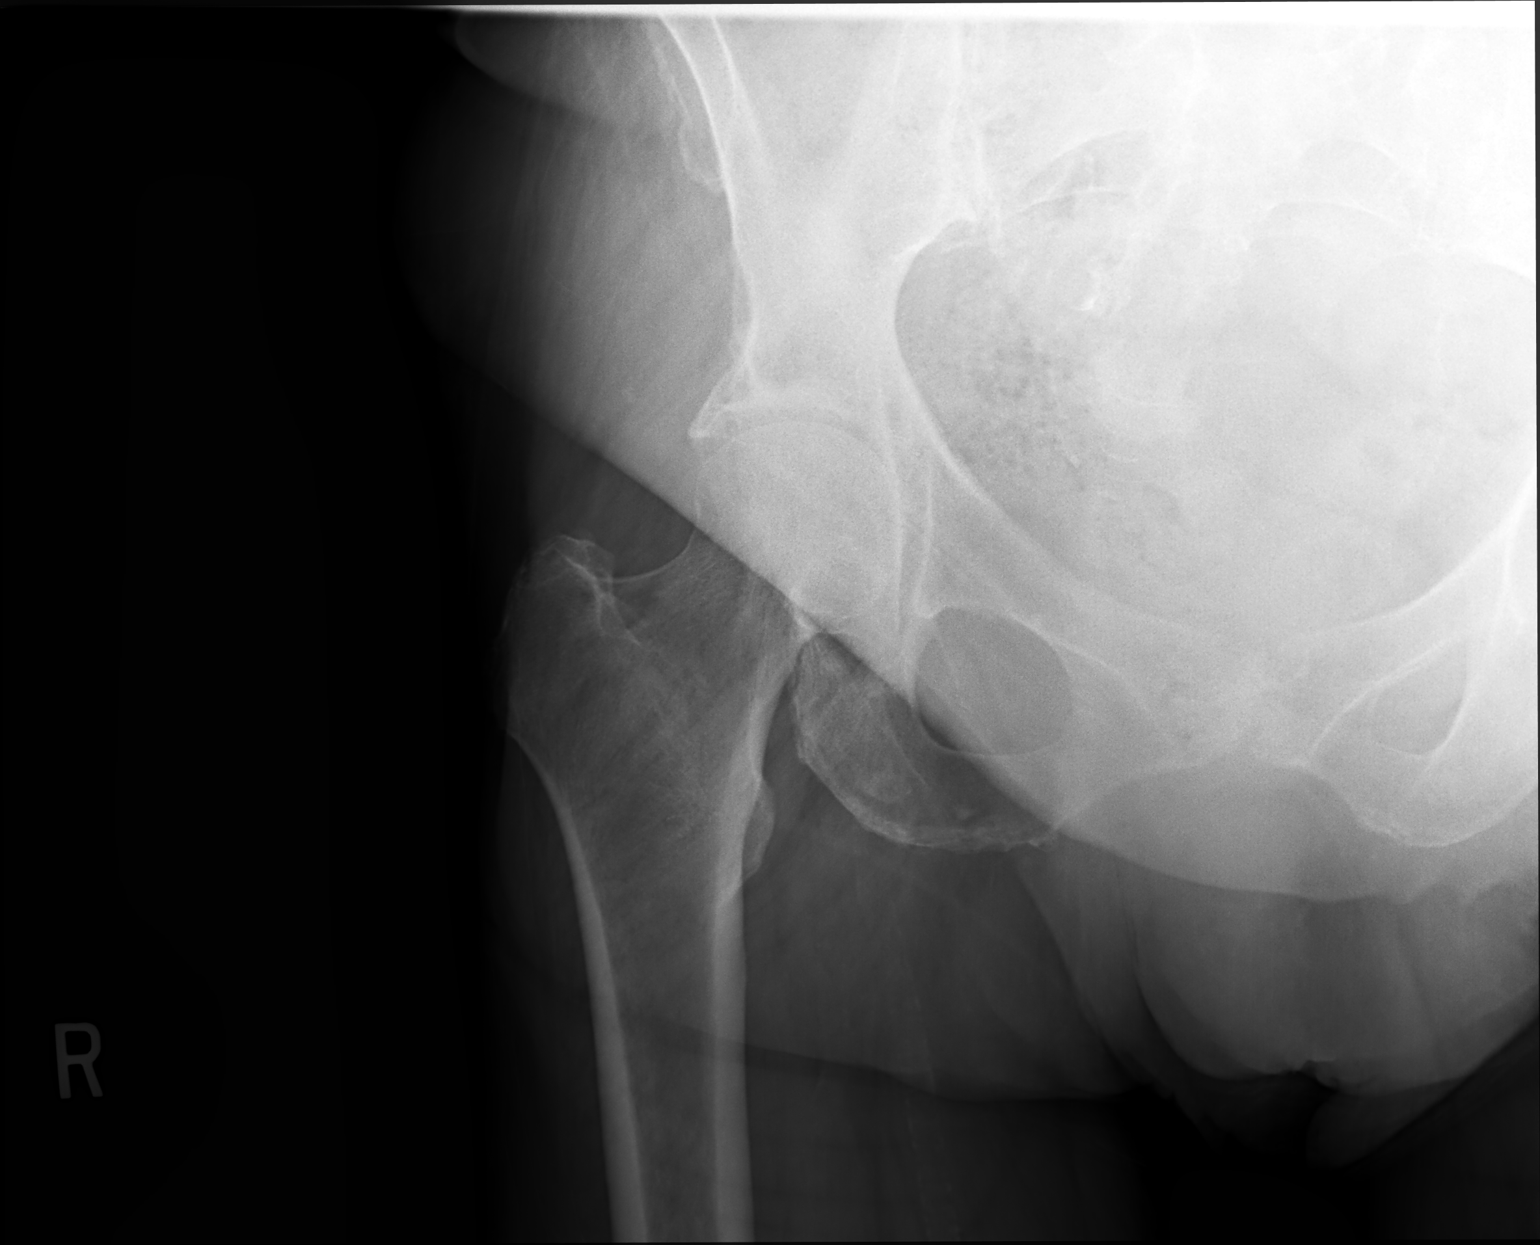

[ap frog obl (oblique)]
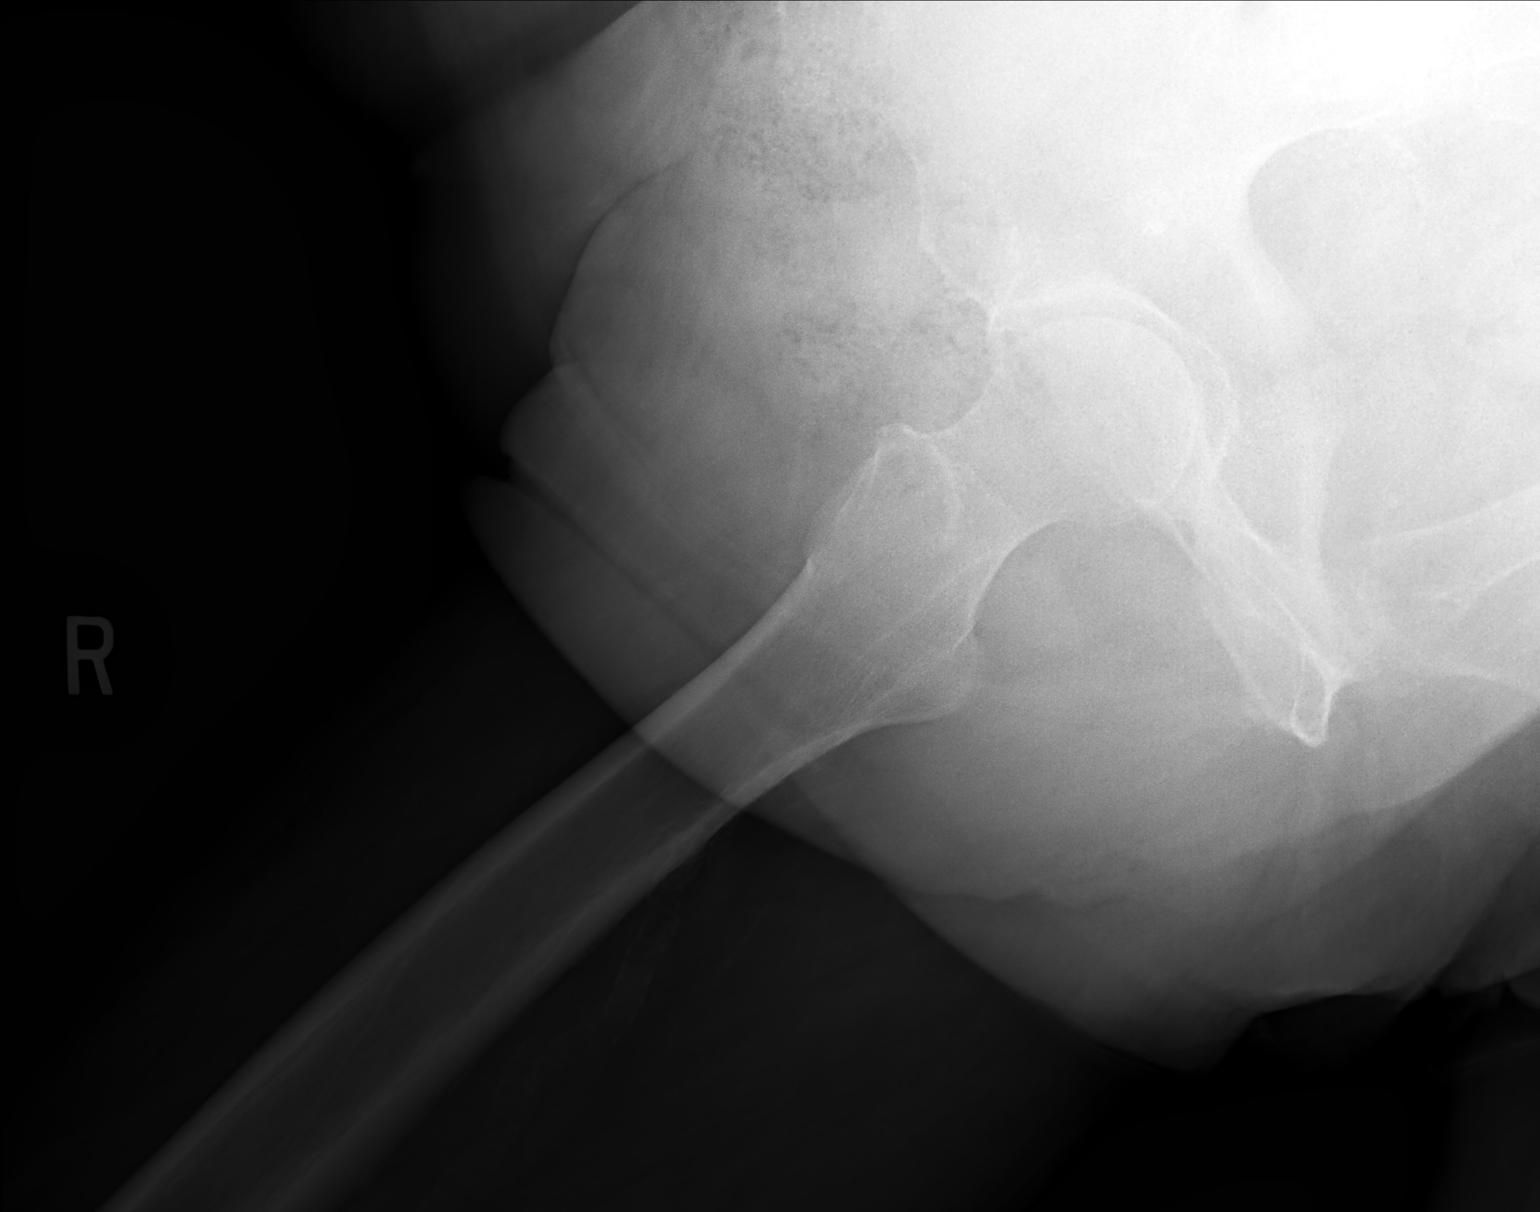

[3 of 3 positions shown; findings below may reference images not displayed]

FINDINGS: No fracture.  No bone lesion.

Hip joints, SI joints and symphysis pubis are normally spaced and
aligned.

Bones are demineralized.  Soft tissues are unremarkable.
IMPRESSION: No fracture, dislocation or acute finding.

## 2020-05-08 IMAGING — DX DG CHEST 2V
2 series · 2 of 2 positions shown · non-contrast
Comparison: PA and lateral chest x-ray July 07, 2017

CLINICAL DATA: Two weeks of cough

EXAM:
CHEST - 2 VIEW

[chest pa]
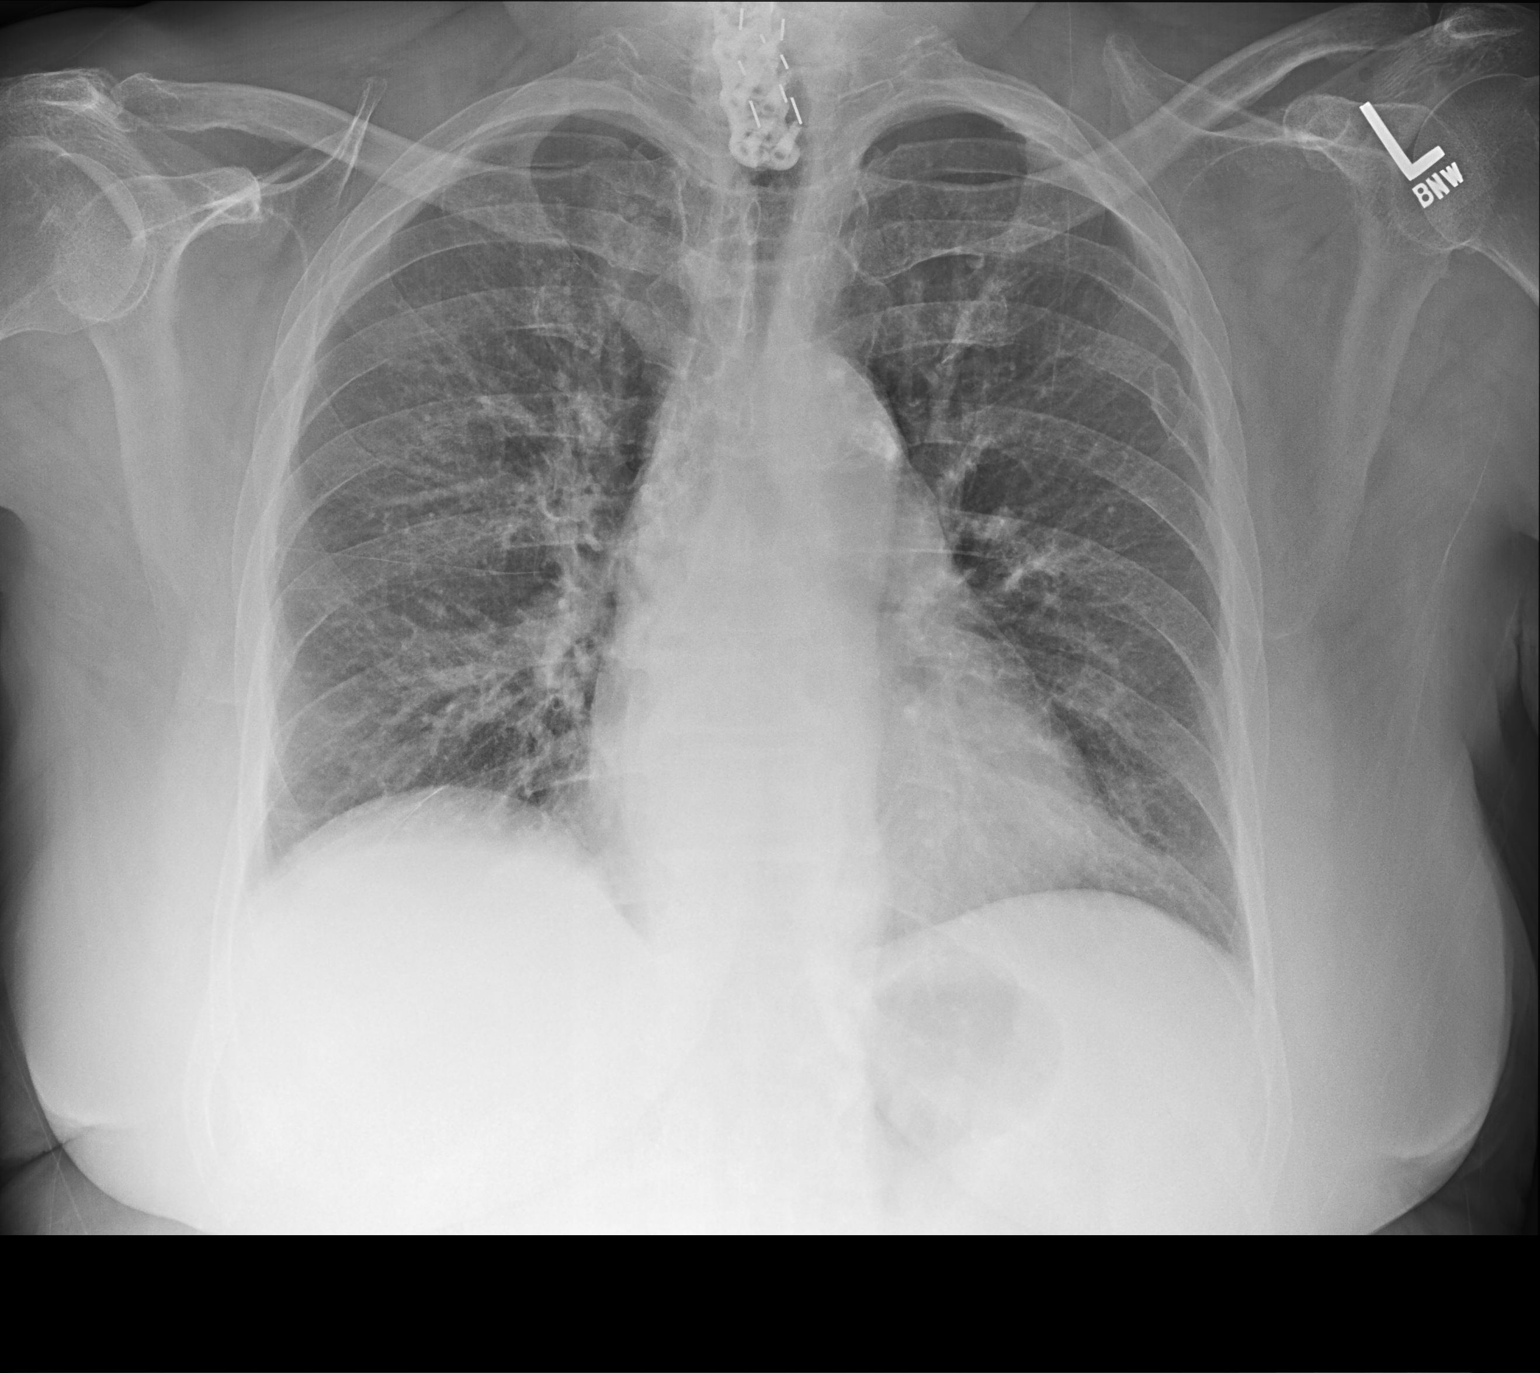

[chest lat]
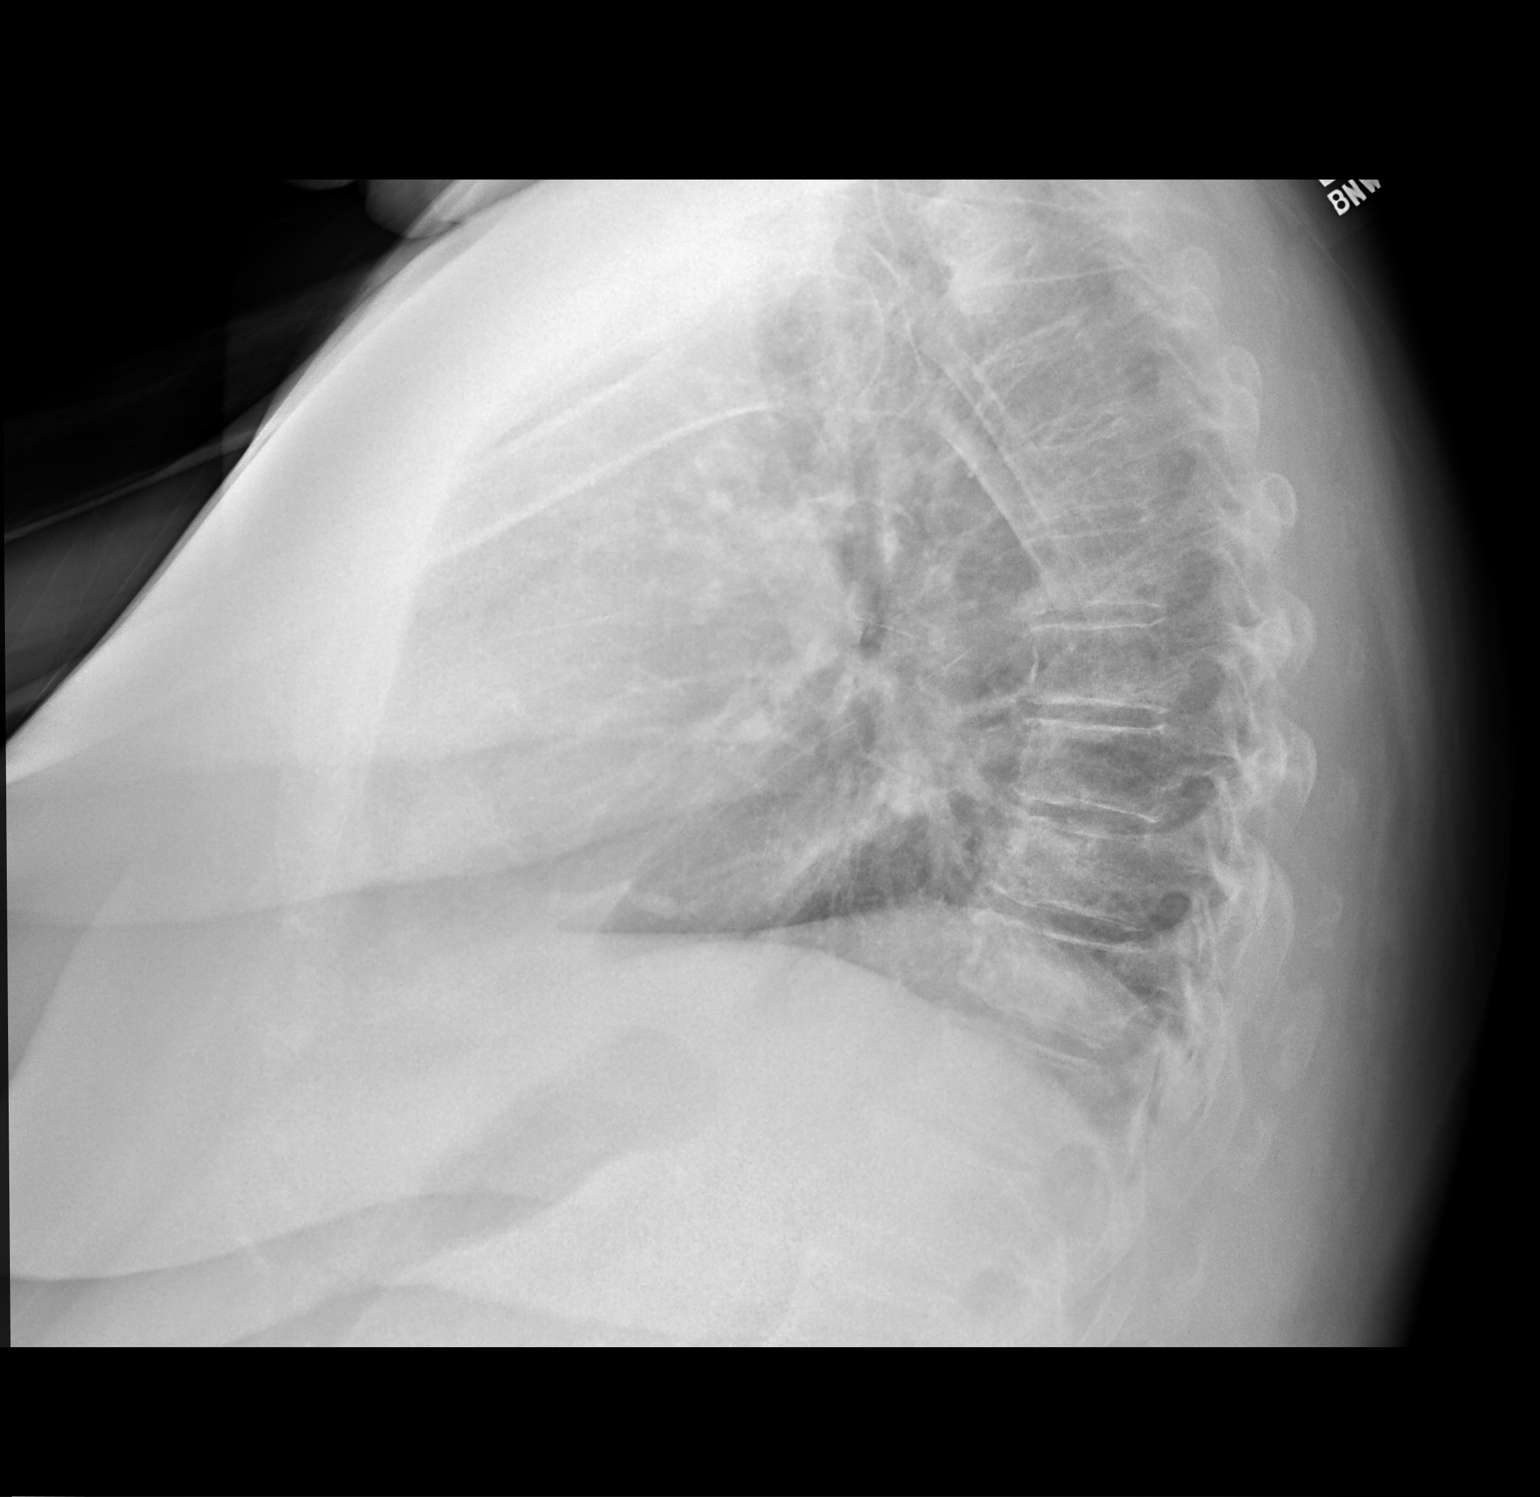

[2 of 2 positions shown; findings below may reference images not displayed]

FINDINGS: The lungs are adequately inflated. The interstitial markings are
increased grade greatest on the right as compared to the previous
study. There is no alveolar infiltrate or pleural effusion. The
heart and pulmonary vascularity are normal. There is calcification
in the wall of the aortic arch. There is old deformity of the
lateral aspect of the left fifth rib which is stable. The thoracic
spine exhibits no acute abnormality. Previous lower cervical fusion.
IMPRESSION: Increased lung markings may reflect acute on chronic bronchitic
change or acute interstitial type pneumonia. Followup PA and lateral
chest X-ray is recommended in 3-4 weeks following trial of
antibiotic therapy to ensure resolution and exclude underlying
malignancy.

Thoracic aortic atherosclerosis.

## 2020-07-02 ENCOUNTER — Telehealth: Payer: Self-pay | Admitting: Cardiovascular Disease

## 2020-07-02 NOTE — Telephone Encounter (Signed)
3 attempts to schedule fu appt from recall list.   Deleting recall.   

## 2020-07-07 IMAGING — CR DG CHEST 2V
1 series · 2 of 2 positions shown · non-contrast
Comparison: 02/10/2018

CLINICAL DATA: Right-sided chest pain radiating to the right.

EXAM:
CHEST - 2 VIEW

[Series 1: dg chest 2 view · 0.14mm/px · 2 of 2 slices shown]
[im 1/2]
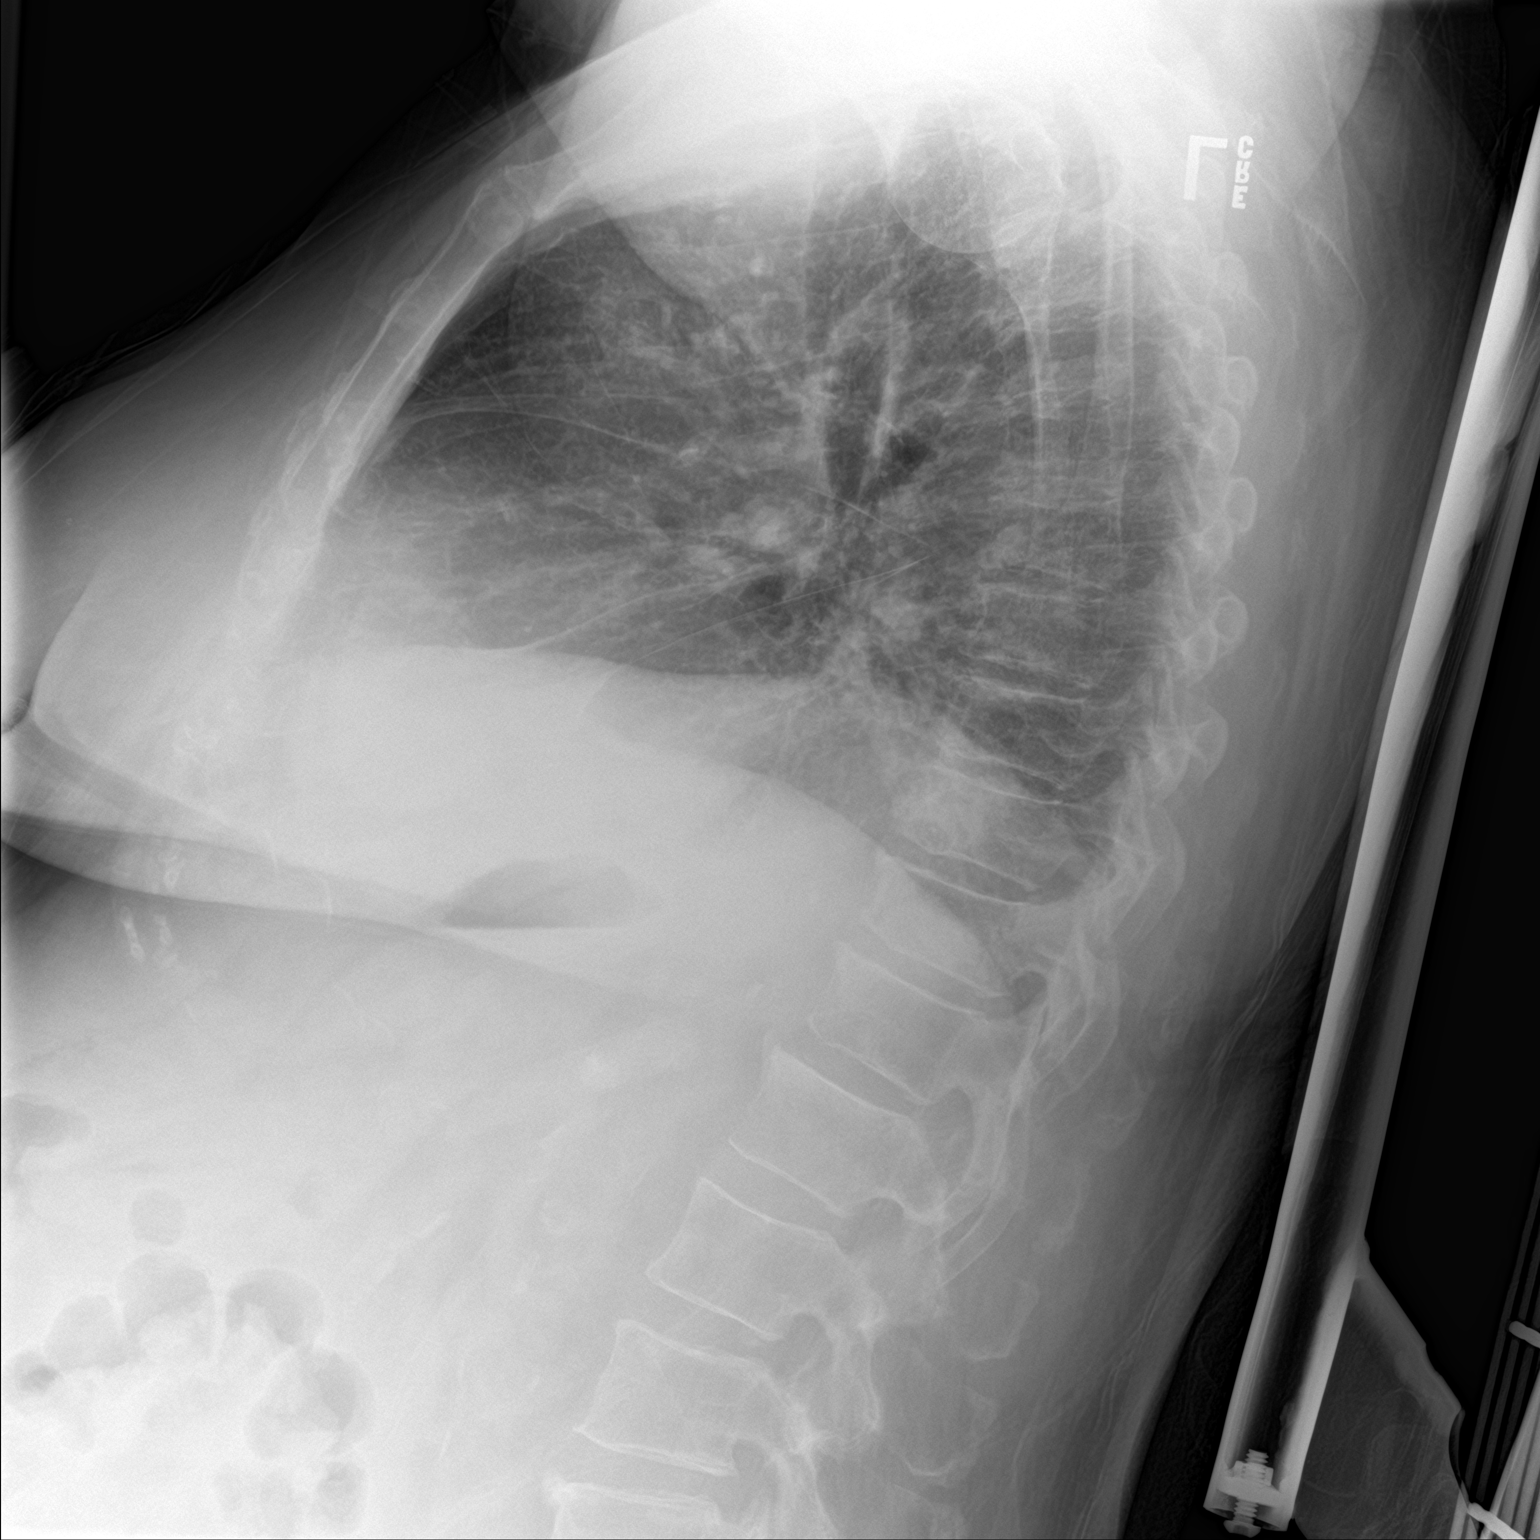
[im 2/2]
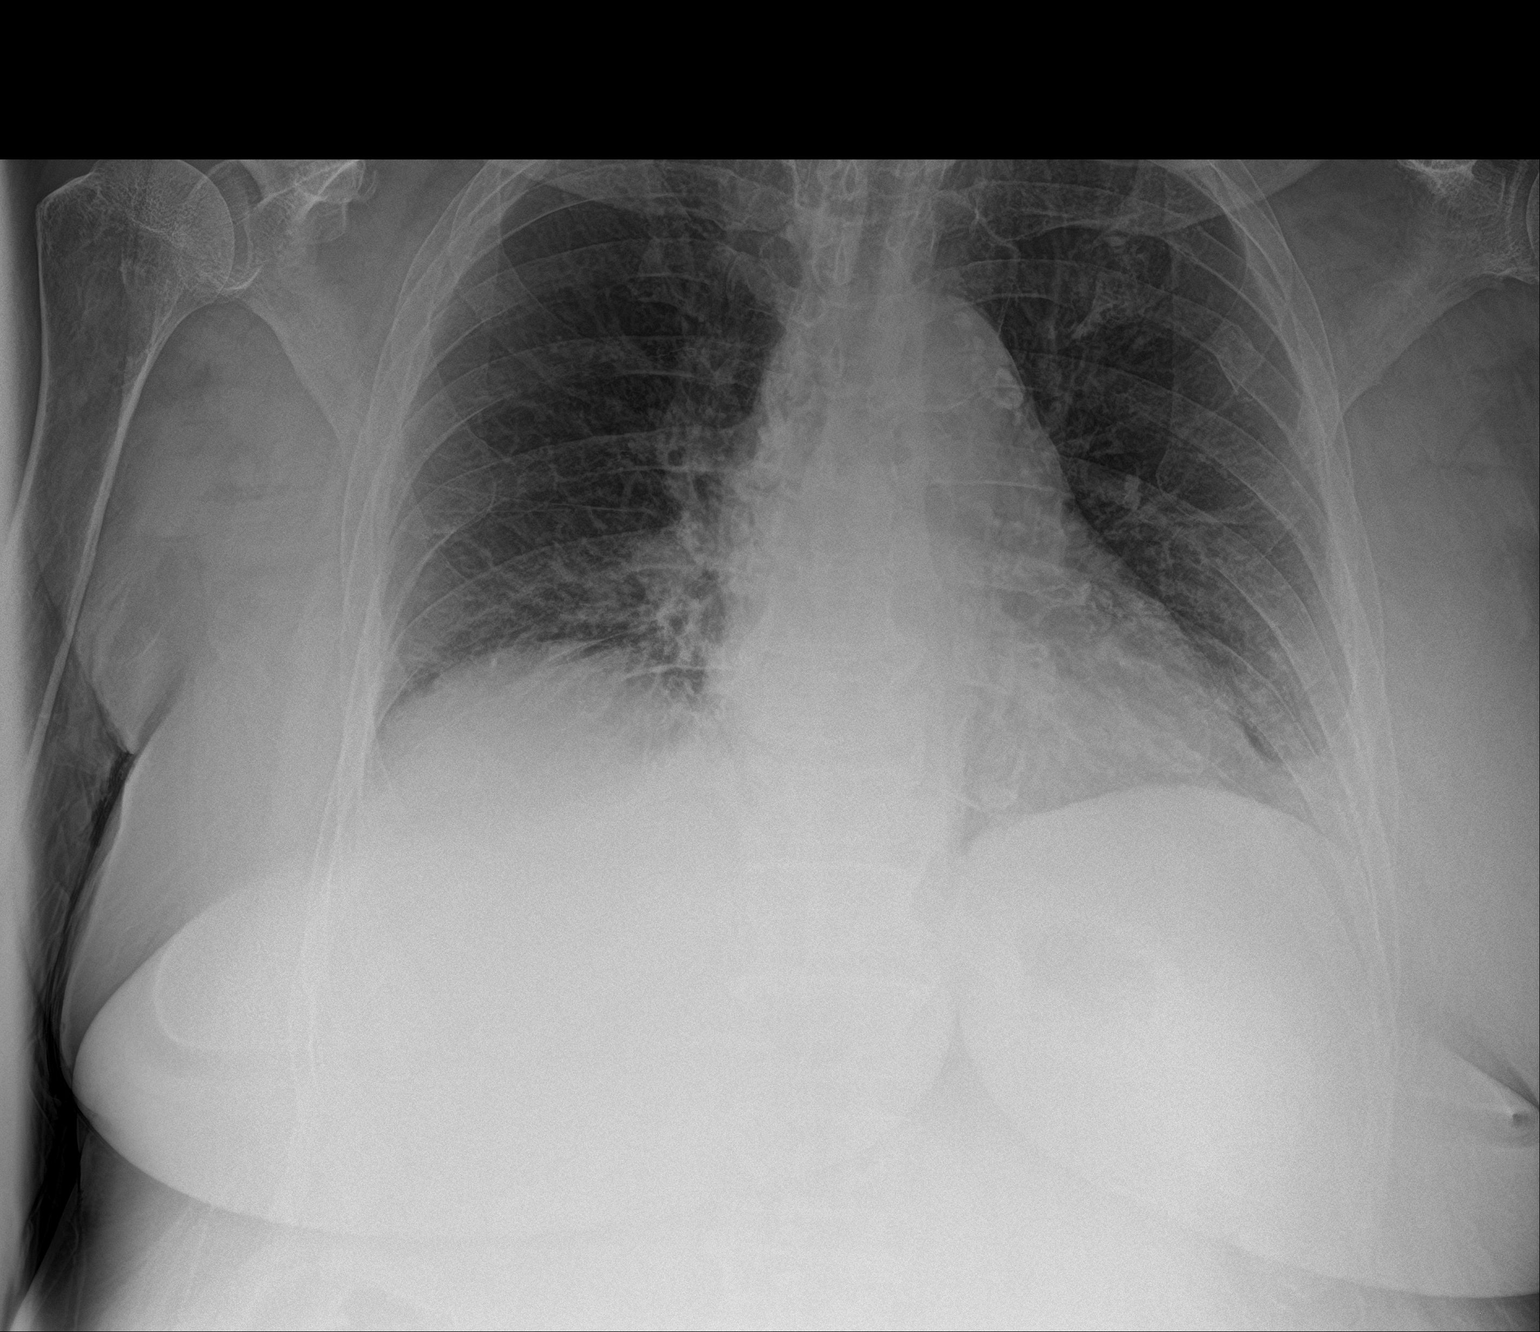

[2 of 2 positions shown; findings below may reference images not displayed]

FINDINGS: Fibrosis or atelectasis in the lung bases. Peribronchial thickening
suggesting chronic bronchitis. No airspace disease or consolidation.
No blunting of costophrenic angles. No pneumothorax. Mediastinal
contours appear intact. Degenerative changes in the spine and
shoulders. Postoperative changes in the cervical spine.
Calcification of the aorta.
IMPRESSION: Fibrosis or atelectasis in the lung bases. Chronic bronchitic
changes.

## 2020-07-11 IMAGING — CT CT CERVICAL SPINE W/O CM
3 of 7 series · 13 of 33 positions shown, 15 images · non-contrast
Comparison: Head CT, 10/23/2015

CLINICAL DATA: Near syncopal event.

EXAM:
CT HEAD WITHOUT CONTRAST
CT CERVICAL SPINE WITHOUT CONTRAST
TECHNIQUE: Multidetector CT imaging of the head and cervical spine was
performed following the standard protocol without intravenous
contrast. Multiplanar CT image reconstructions of the cervical spine
were also generated.

[Series 6: coronal soft tissue · coronal · 0.32mm/px · 3 of 62 slices shown]
[im 16/62  bone]
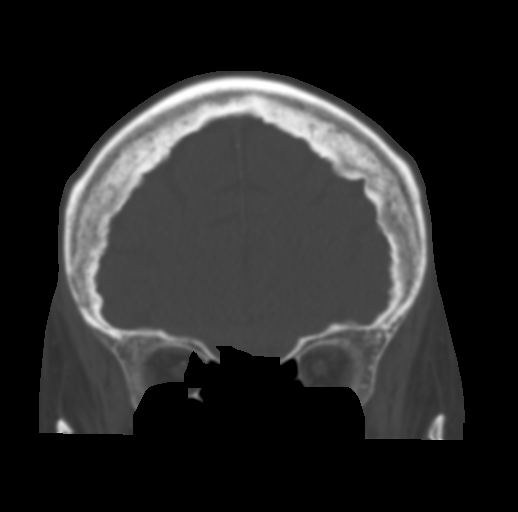
[im 31/62  bone]
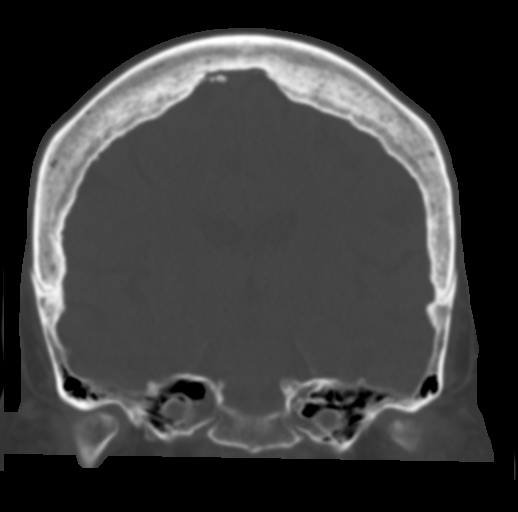
[im 46/62  bone]
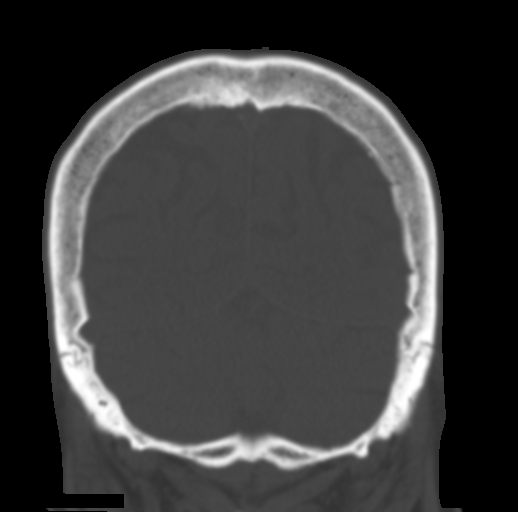

[Series 10: sagittal bone · sagittal · 0.28mm/px · 5 of 71 slices shown]
[im 11/71  bone]
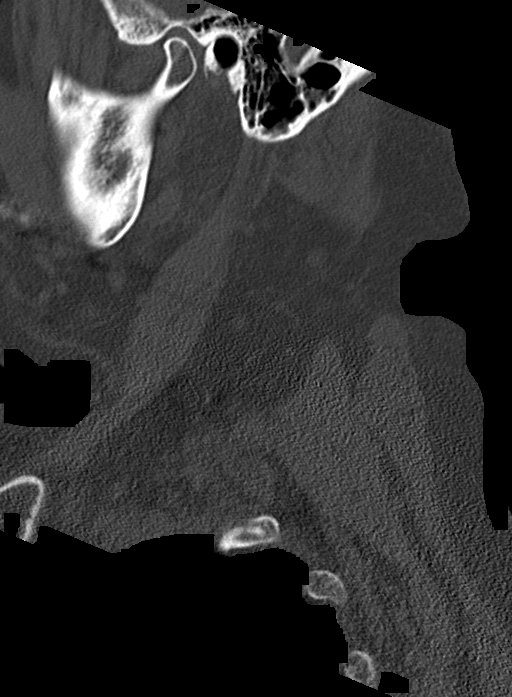
[im 21/71  bone]
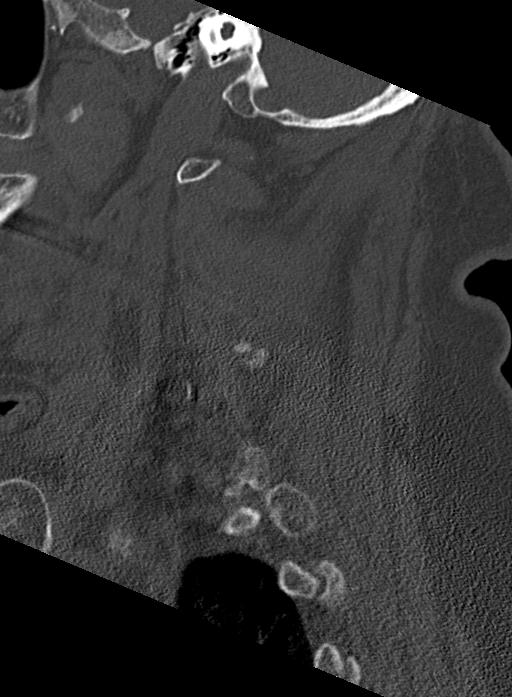
[im 31/71  bone]
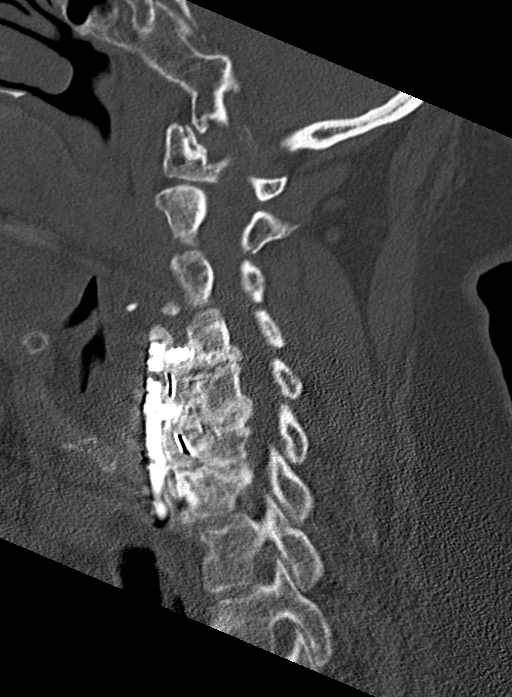
[im 41/71  bone]
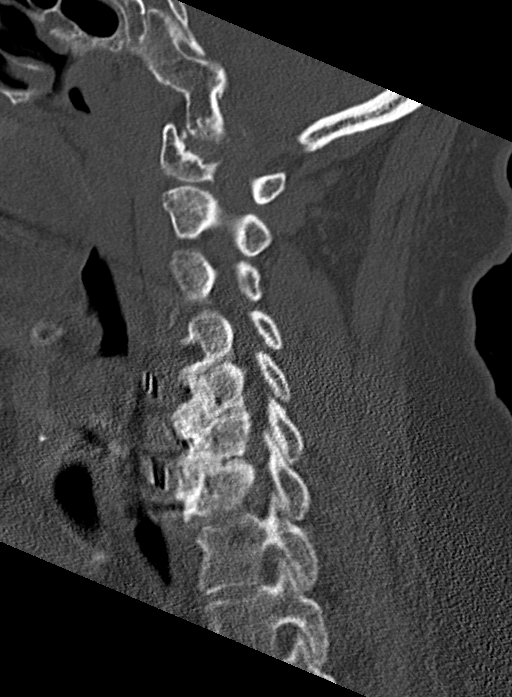
[im 51/71  bone]
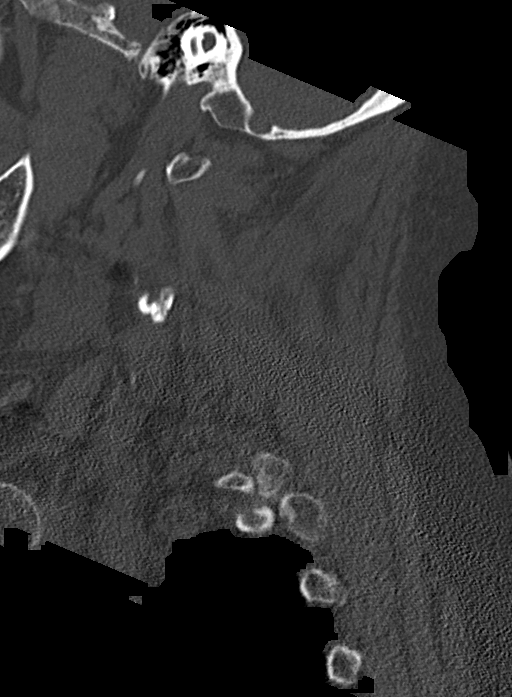

[Series 12: orthogonal bone · axial · 0.34mm/px · z∈[-10,+113]mm · 5 of 99 slices shown, 7 images]
[im 17/99  soft-tissue]
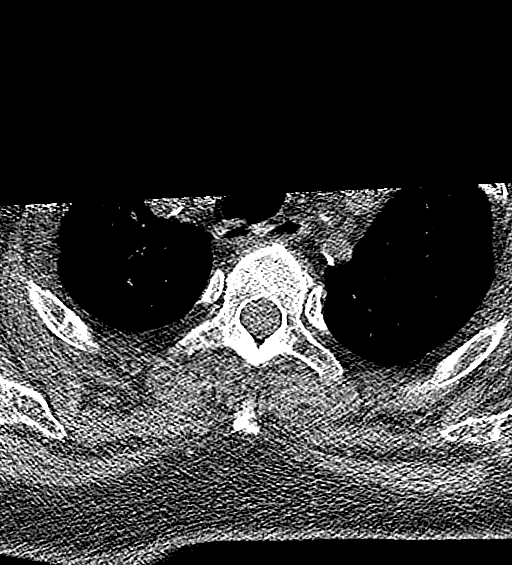
[im 17/99  bone]
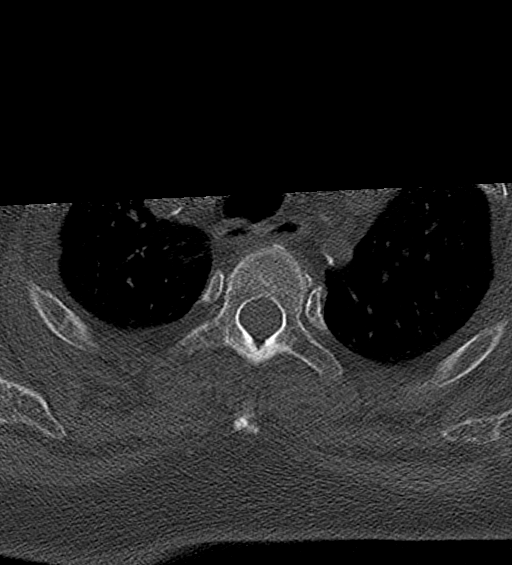
[im 33/99  bone]
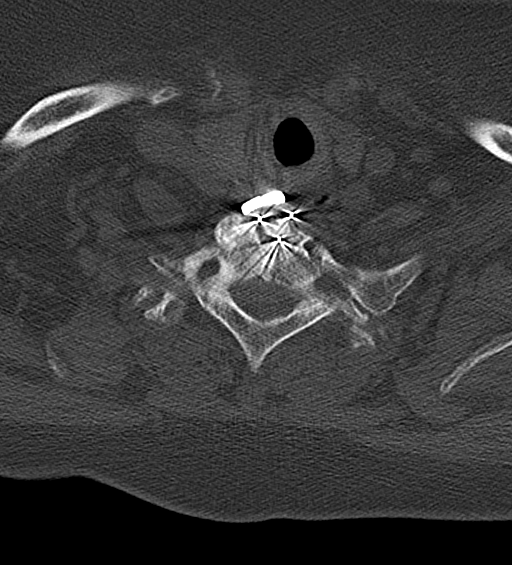
[im 50/99  bone]
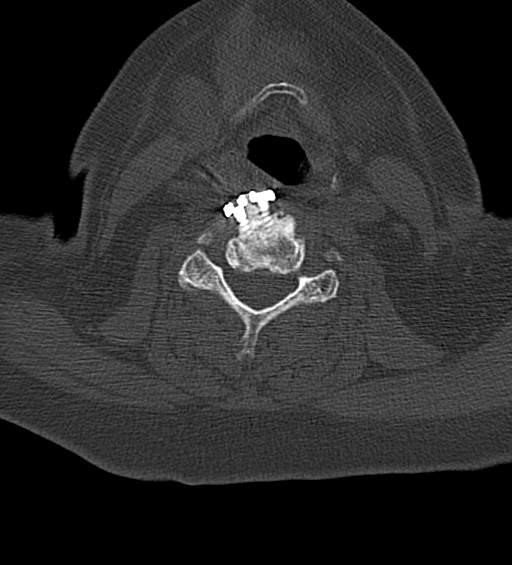
[im 66/99  bone]
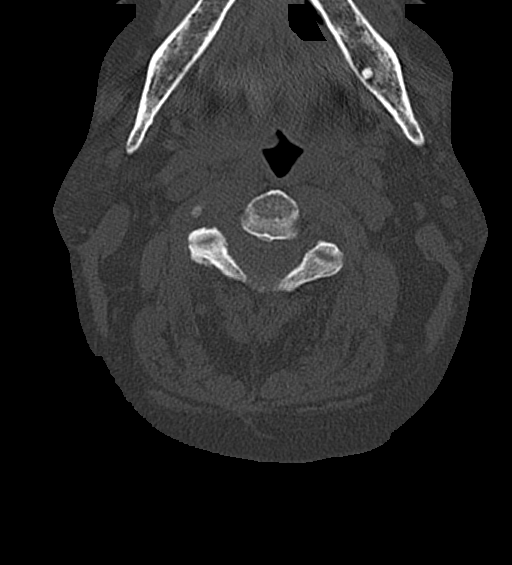
[im 82/99  soft-tissue]
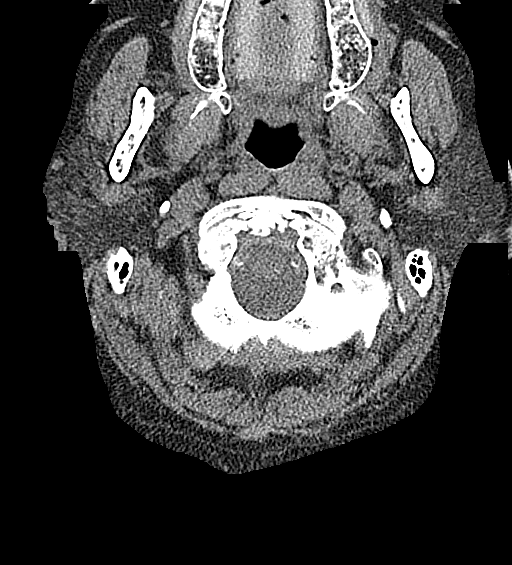
[im 82/99  bone]
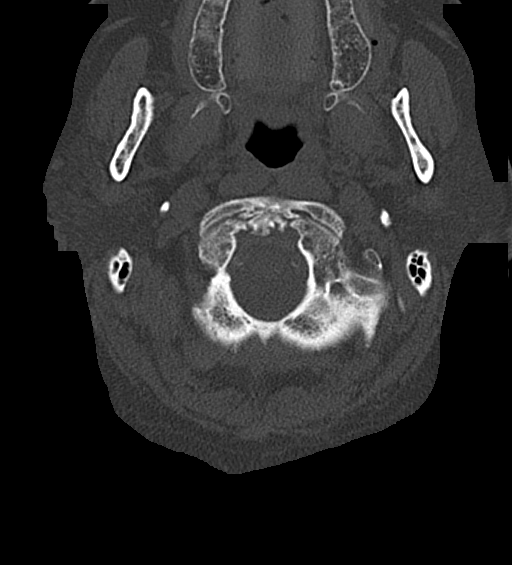

[13 of 33 positions shown; findings below may reference images not displayed]

FINDINGS: CT HEAD FINDINGS

Brain: No evidence of acute infarction, hemorrhage, hydrocephalus,
extra-axial collection or mass lesion/mass effect.

Minor periventricular white matter hypoattenuation noted consistent
with chronic microvascular ischemic change. Chronic basal gangliar
calcifications.

Vascular: No hyperdense vessel or unexpected calcification.

Skull: Normal. Negative for fracture or focal lesion.

Sinuses/Orbits: Globes and orbits are unremarkable. The visualized
sinuses and mastoid air cells are clear.

Other: None.

CT CERVICAL SPINE FINDINGS

Alignment: Mild kyphosis, apex at C5-C6.  No spondylolisthesis.

Skull base and vertebrae: No acute fracture. No primary bone lesion
or focal pathologic process.

Soft tissues and spinal canal: No prevertebral fluid or swelling. No
visible canal hematoma.

Disc levels: Status post anterior cervical disc fusion C4 through
C7. Bone graft material in the fusion plate protrudes anterior fuse
vertebra, which appears chronic. Moderate loss of disc height at
C7-T1. Remaining unfused discs are well preserved in height. No disc
herniation visualized.

Upper chest: No masses or adenopathy. Dense carotid vascular
calcifications. No acute findings. Lung apices are clear.

Other: None.
IMPRESSION: HEAD CT

1. No acute intracranial abnormalities.
2. Minor chronic microvascular ischemic change.

CERVICAL CT

1. No fracture or acute finding.

## 2020-07-11 IMAGING — CT CT ABD-PELV W/O CM
2 of 4 series · 15 of 46 positions shown, 17 images · non-contrast
Comparison: 06/03/2017, 05/23/2017 and 10/19/2015

CLINICAL DATA: Diarrhea past several days with abdominal pain.
History of IV contrast allergy.

EXAM:
CT ABDOMEN AND PELVIS WITHOUT CONTRAST
TECHNIQUE: Multidetector CT imaging of the abdomen and pelvis was performed
following the standard protocol without IV contrast.

[Series 2: routine abd/pel wo · axial · 0.79mm/px · z∈[-556,-66]mm · 12 of 108 slices shown, 14 images]
[im 5/108  soft-tissue]
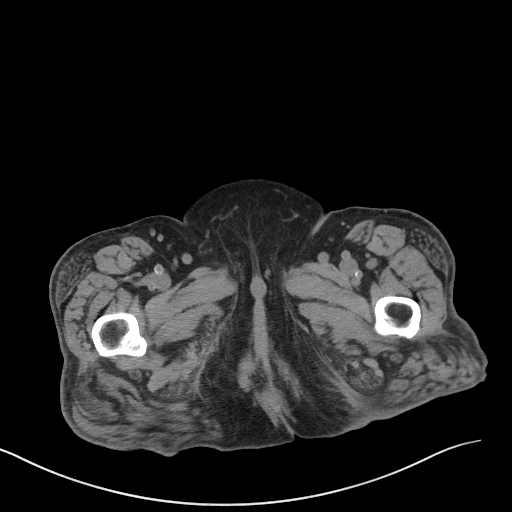
[im 5/108  bone]
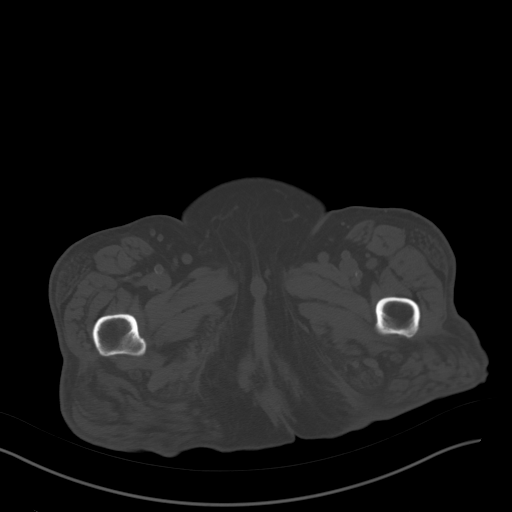
[im 14/108  soft-tissue]
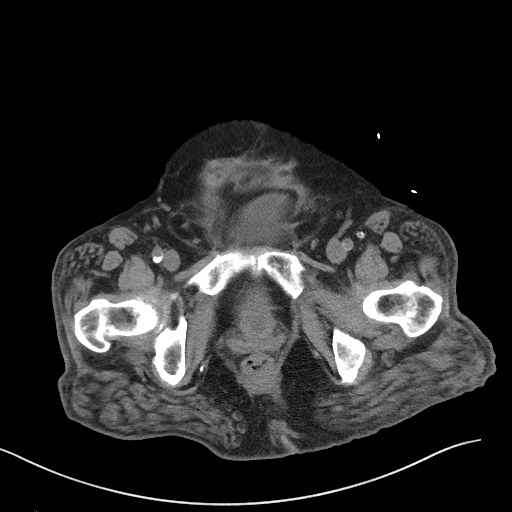
[im 24/108  soft-tissue]
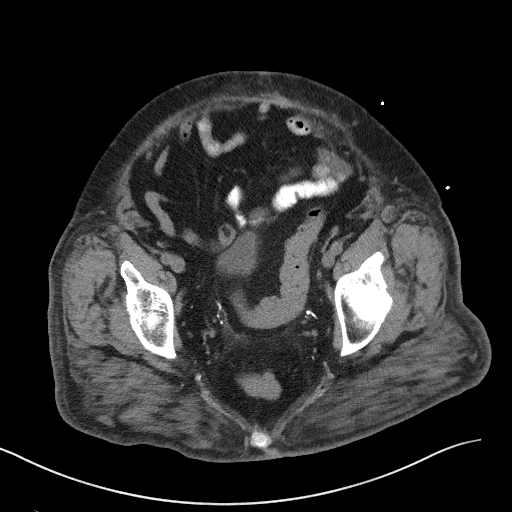
[im 33/108  soft-tissue]
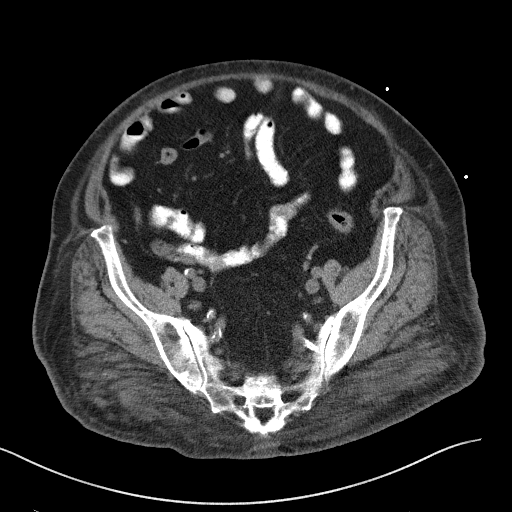
[im 42/108  soft-tissue]
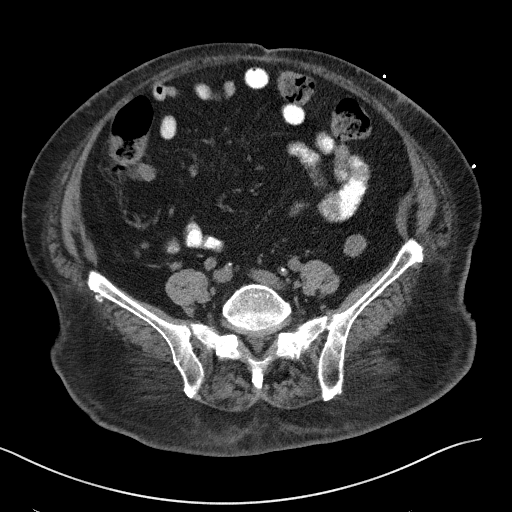
[im 52/108  soft-tissue]
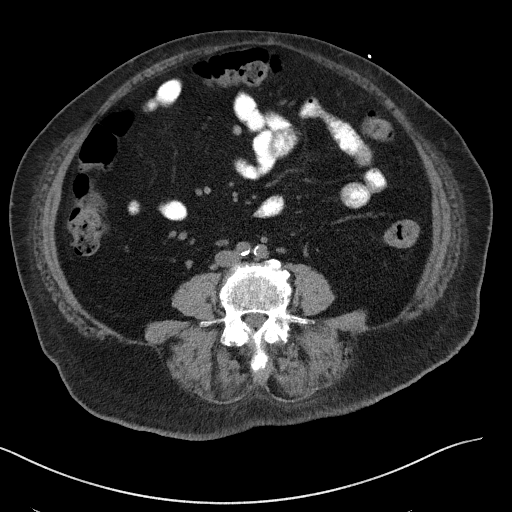
[im 56/108  soft-tissue]
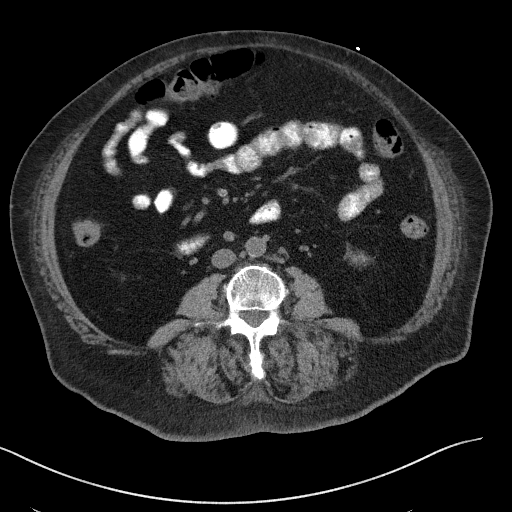
[im 66/108  soft-tissue]
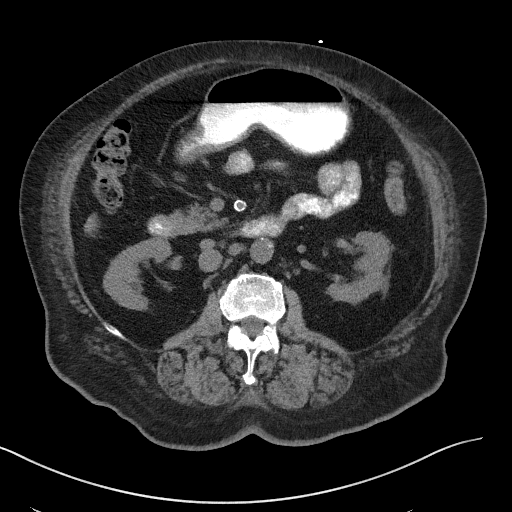
[im 75/108  soft-tissue]
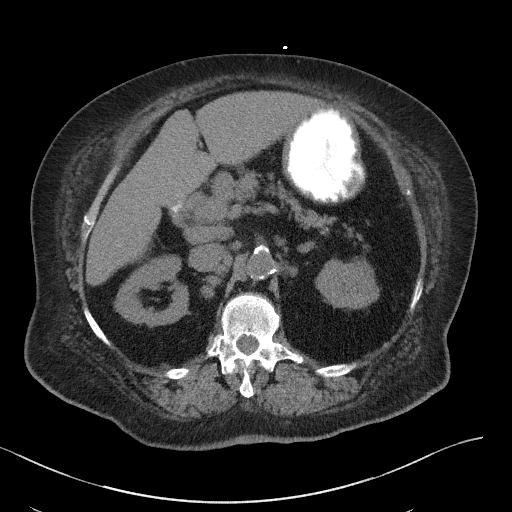
[im 75/108  bone]
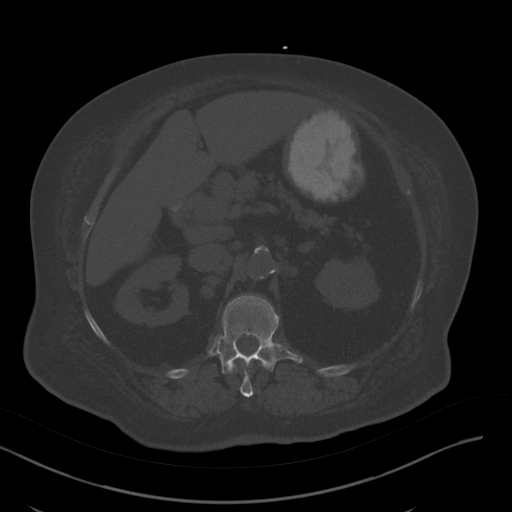
[im 84/108  soft-tissue]
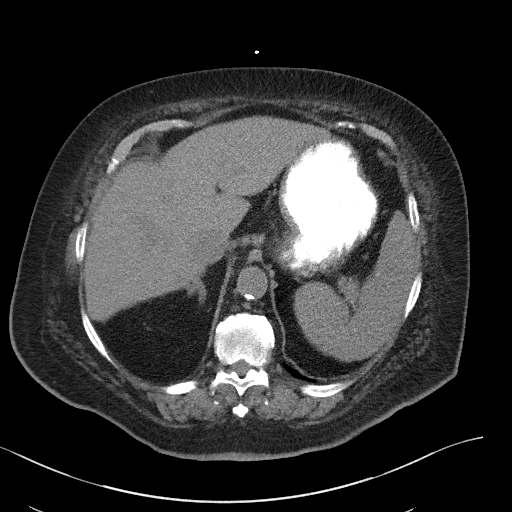
[im 94/108  soft-tissue]
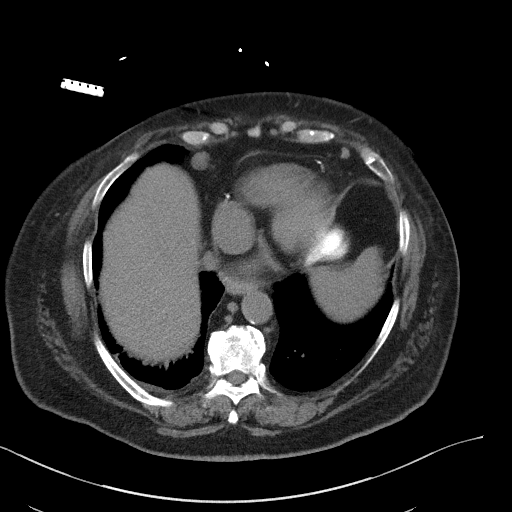
[im 103/108  soft-tissue]
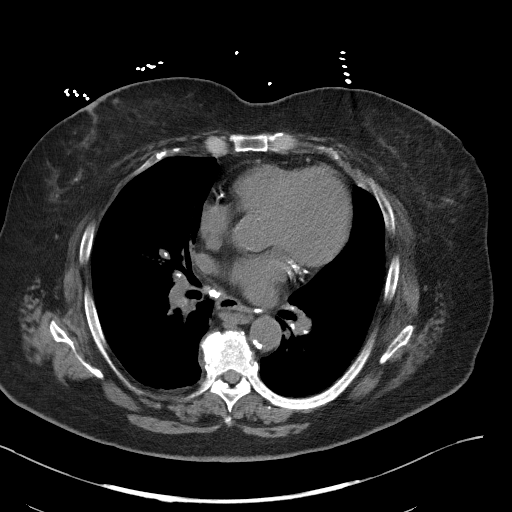

[Series 5: coronal st · coronal · 0.78mm/px · 3 of 112 slices shown]
[im 38/112  soft-tissue]
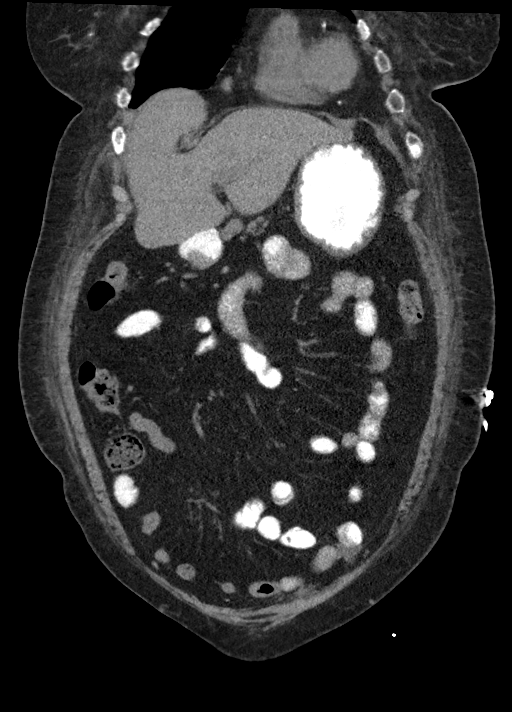
[im 50/112  soft-tissue]
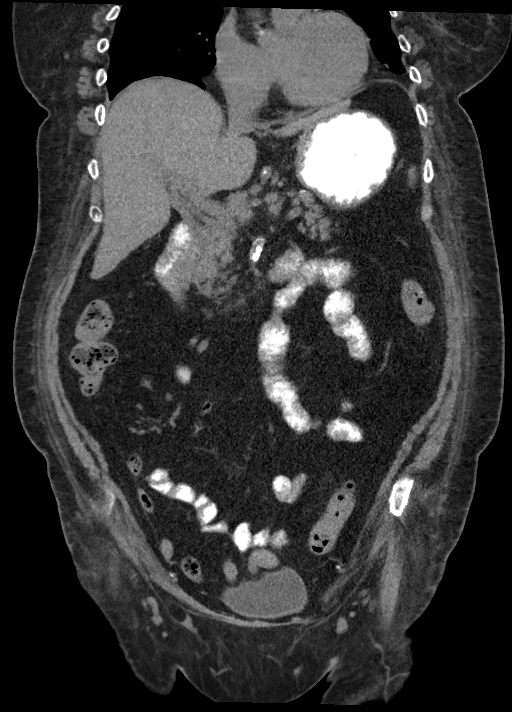
[im 62/112  soft-tissue]
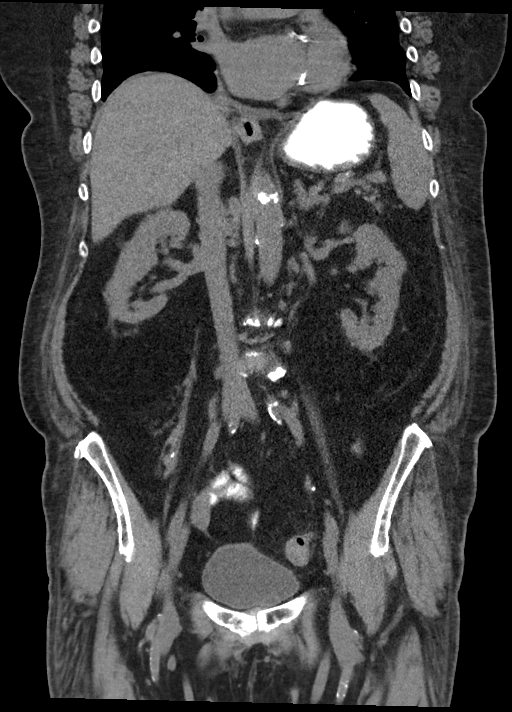

[15 of 46 positions shown; findings below may reference images not displayed]

FINDINGS: Lower chest: No airspace process or significant effusion. Few tiny
pulmonary nodules within the lung bases with the largest measuring 5
mm over the right lower lobe as these are unchanged from 05/23/2017.
Heart is normal size. Calcification of the mitral valve annulus.
Calcified plaque over the left main and 3 vessel coronary arteries.
Calcified plaque over the thoracic aorta. There are calcified hilar
mediastinal lymph nodes. Several bilateral pericardial phrenic lymph
nodes unchanged. Several prominent lymph nodes adjacent the
descending thoracic aorta unchanged.

Hepatobiliary: Gallbladder not visualized. Liver and biliary tree
are within normal.

Pancreas: Normal.

Spleen: Normal.

Adrenals/Urinary Tract: Adrenal glands are within normal. Kidneys
are normal size without hydronephrosis or nephrolithiasis. The
ureters and bladder are unremarkable.

Stomach/Bowel: Stomach and small bowel are normal. Appendix not
visualized and likely has been resected. Minimal diverticulosis of
the colon.

Vascular/Lymphatic: Mild-to-moderate calcified plaque over the
abdominal aorta. Moderate calcified plaque over the superior
mesenteric artery. Stable small lymph nodes over the gastrohepatic
ligament and upper periaortic region.

Reproductive: Previous hysterectomy.

Other: No free fluid or focal inflammatory change.

Musculoskeletal: Degenerative change of the spine and hips.
IMPRESSION: No acute findings in the abdomen/pelvis.

Few small nodules over the lung bases unchanged from 05/23/2017 and
likely due to granulomatous disease. There are stable pericardial
phrenic and upper abdominal nodes as described as well as multiple
calcified hilar/mediastinal lymph nodes likely due to this presumed
granulomatous process.

Aortic Atherosclerosis ([id number].H). Atherosclerotic coronary
artery disease.

Minimal colonic diverticulosis.

## 2020-07-11 IMAGING — CR DG CHEST 2V
2 series · 2 of 2 positions shown · non-contrast
Comparison: Chest radiograph March 15, 2018 and priors.

CLINICAL DATA: Syncopal episode.  Shortness of breath.

EXAM:
CHEST - 2 VIEW

[chest lat]
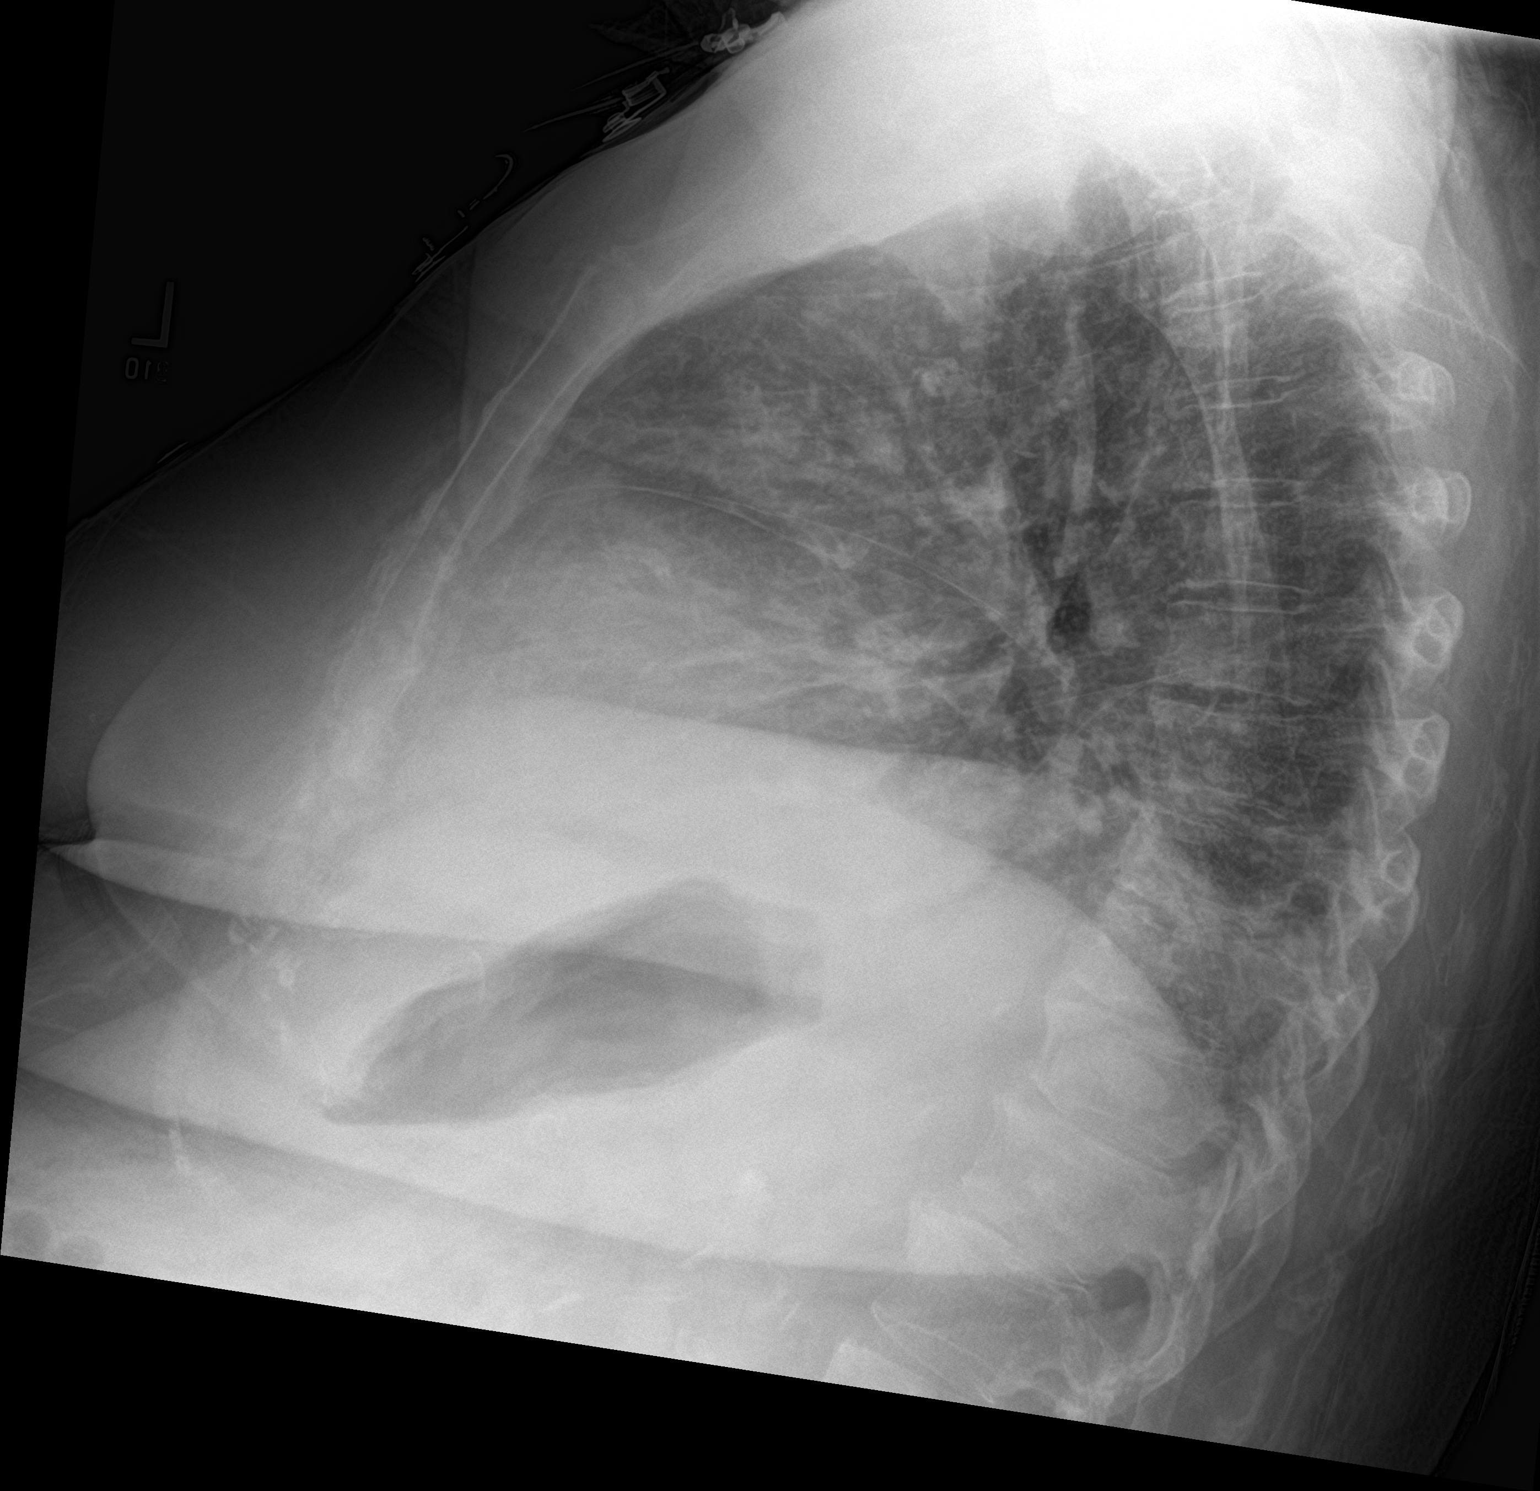

[chest ap]
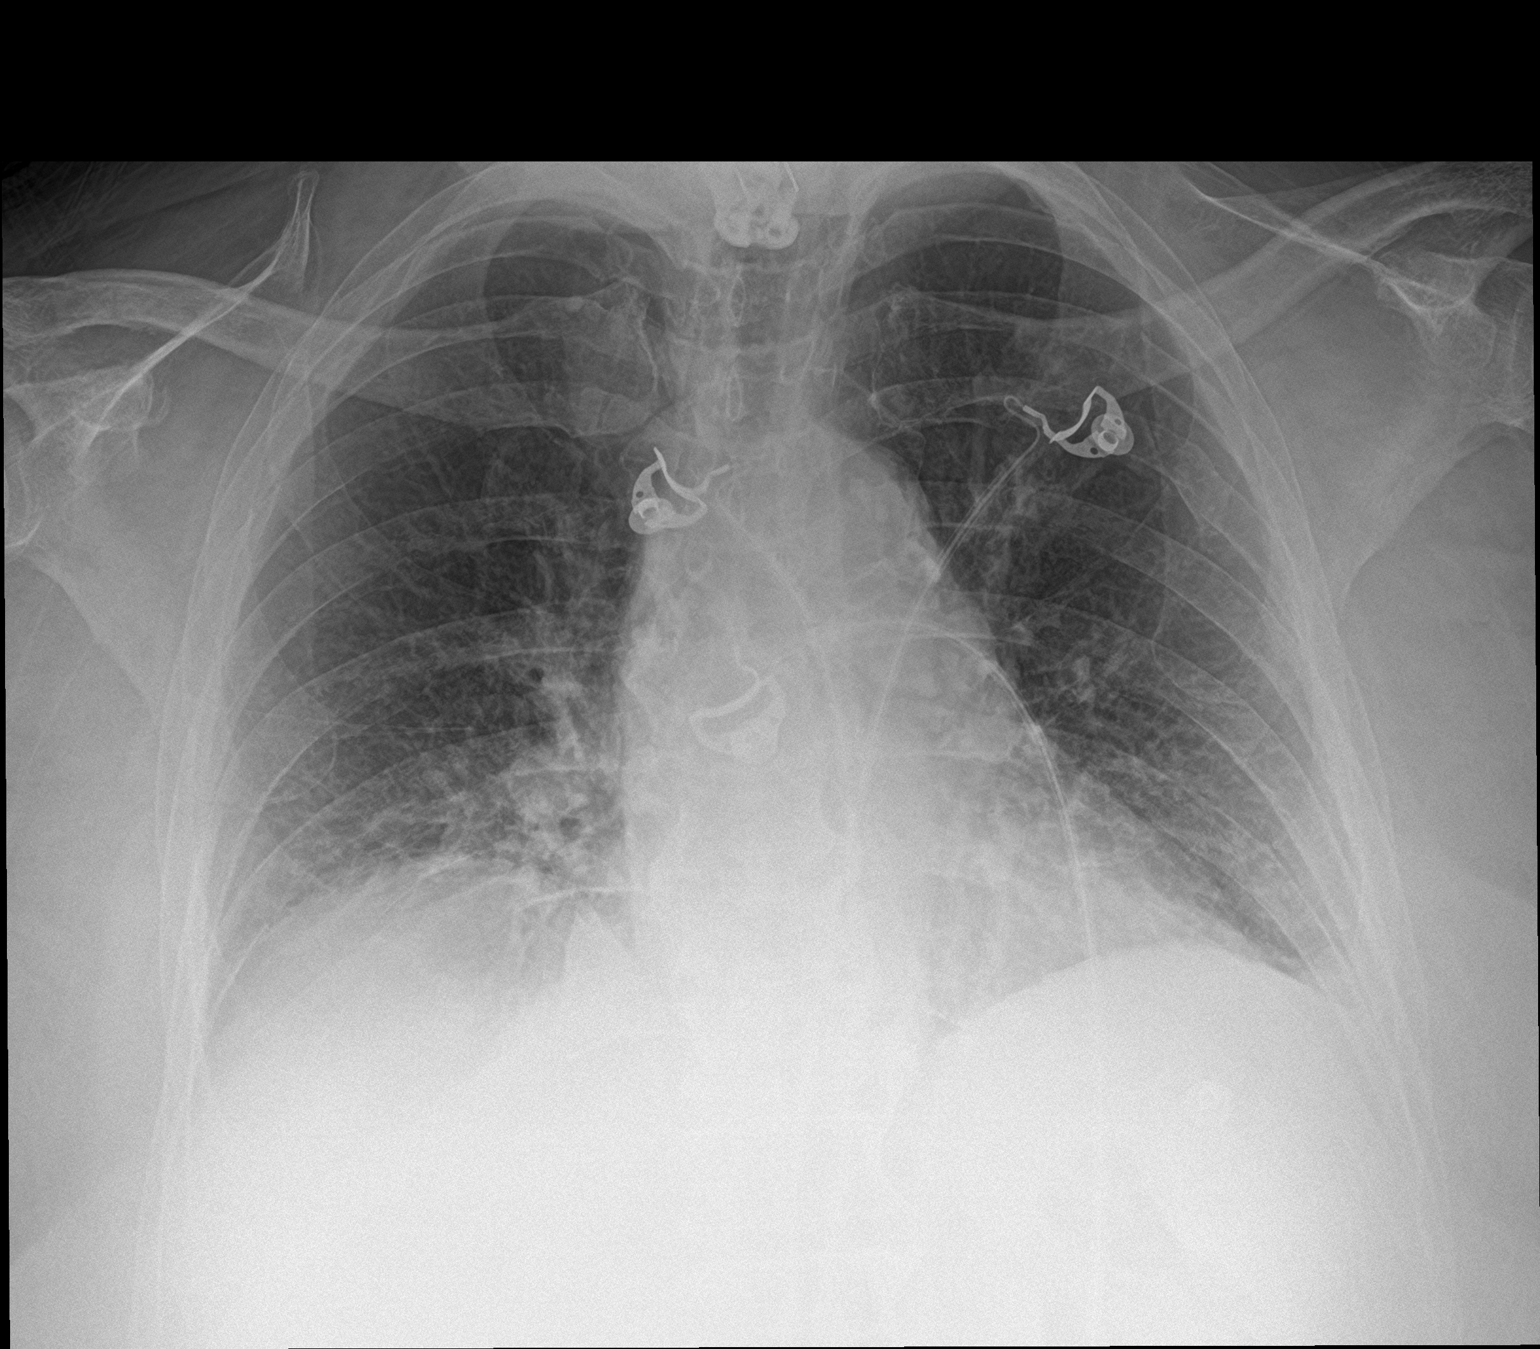

[2 of 2 positions shown; findings below may reference images not displayed]

FINDINGS: Cardiac silhouette is mildly enlarged. Calcified aortic arch.
Diffuse interstitial prominence similar to prior examination with
bibasilar strandy densities. No pleural effusion or focal
consolidation. RIGHT apical granuloma. Old LEFT rib fractures. ACDF.
Moderate degenerative change of the spine.
IMPRESSION: 1. Diffuse interstitial prominence likely in part reflecting chronic
interstitial lung disease. Bibasilar atelectasis/scarring.
2. Stable cardiomegaly.

Aortic Atherosclerosis ([id number].G).

## 2020-08-17 IMAGING — DX DG CHEST 1V PORT
2 series · 2 of 2 positions shown · non-contrast
Comparison: Chest radiograph March 19, 2018

CLINICAL DATA: Shortness of breath.

EXAM:
PORTABLE CHEST 1 VIEW

[chest ap (1 of 2)]
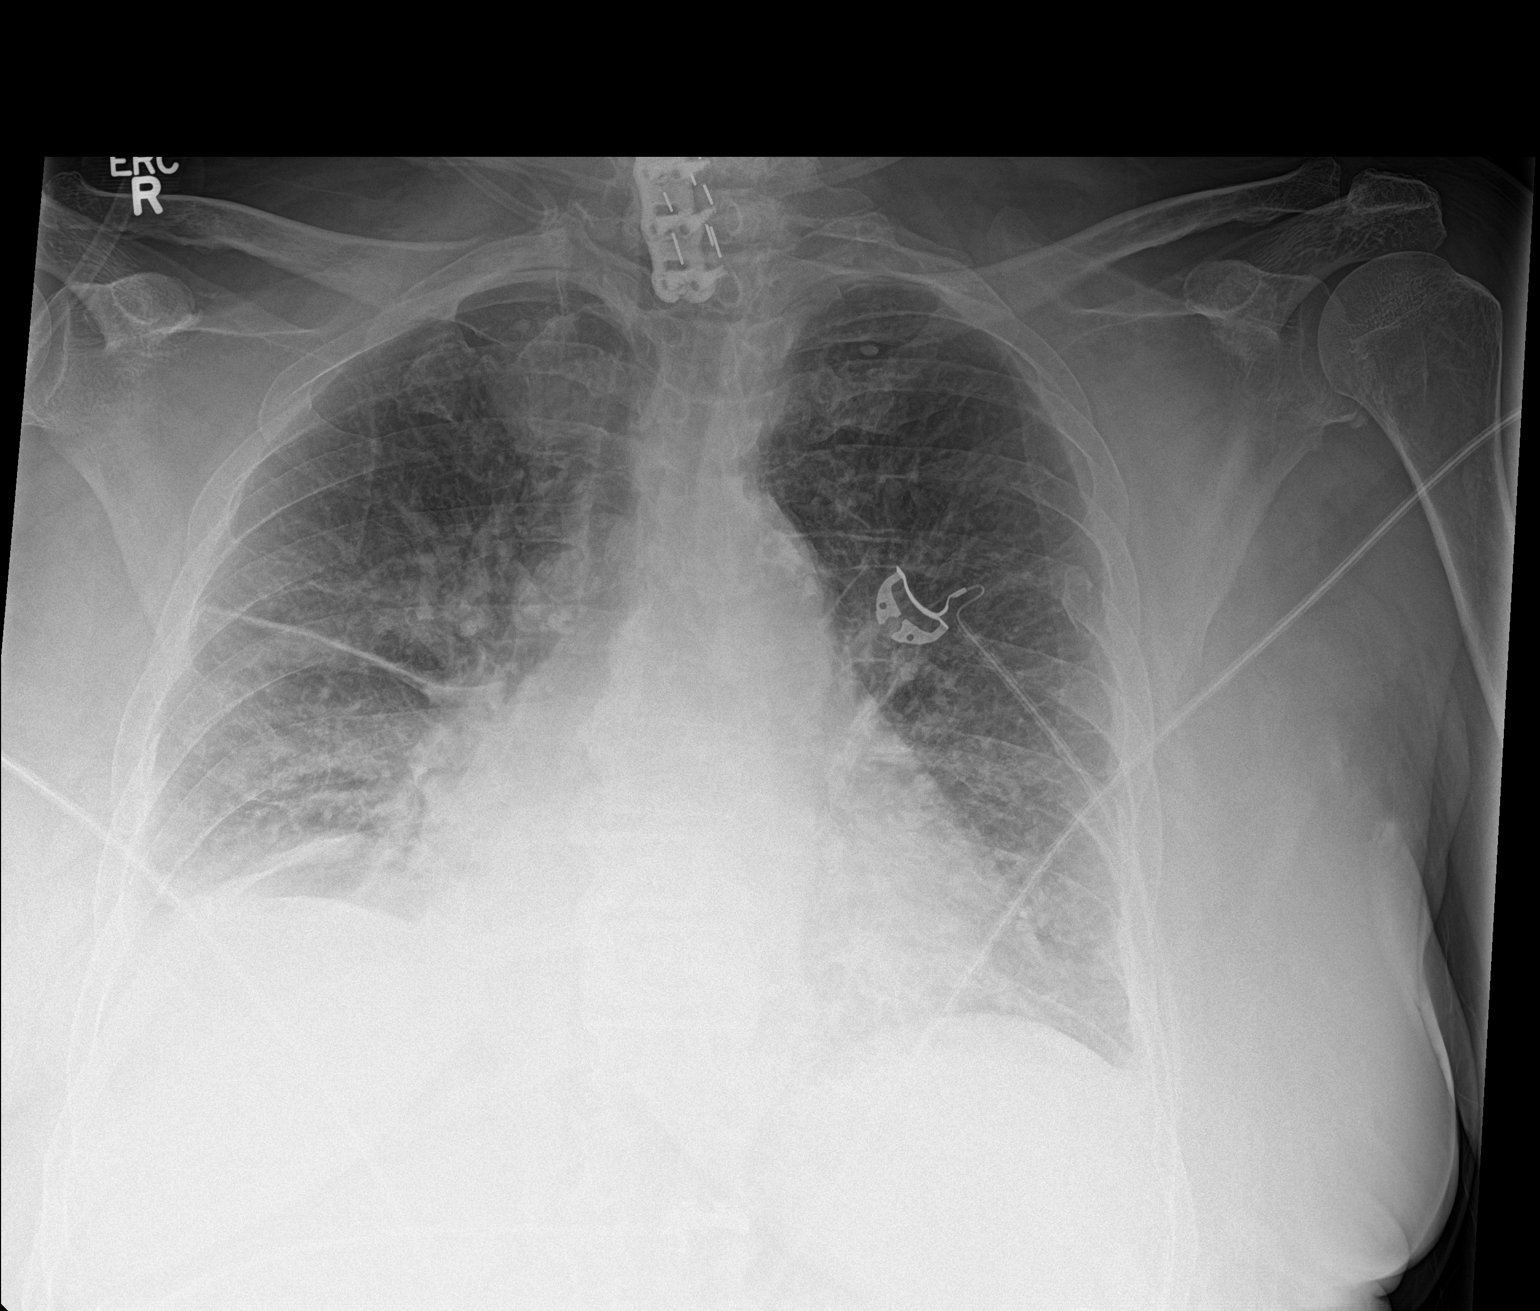

[chest ap (2 of 2)]
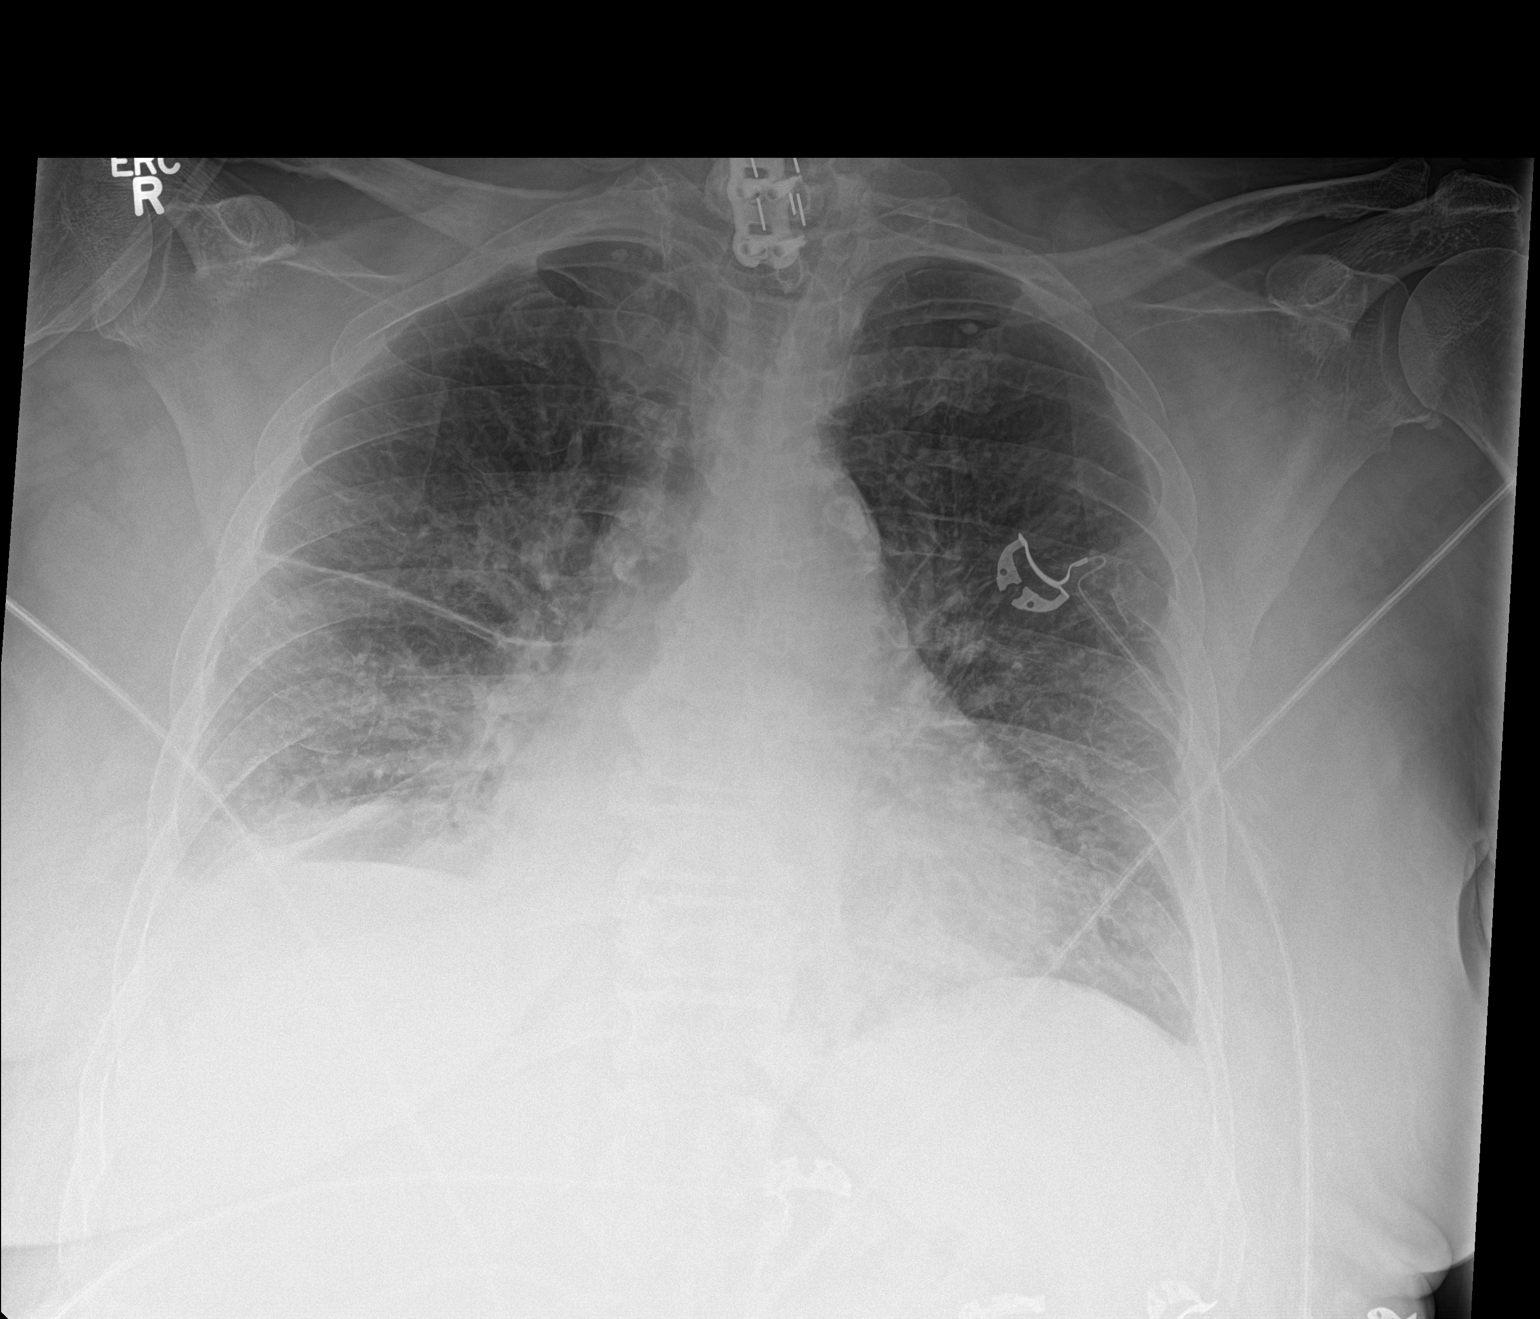

[2 of 2 positions shown; findings below may reference images not displayed]

FINDINGS: Cardiac silhouette is mildly enlarged. Calcified aortic arch. Low
inspiratory examination with increased interstitial prominence. New
bandlike density RIGHT lung base with small RIGHT pleural effusion
tracking to major fissure. Calcified mediastinal lymph nodes. No
pneumothorax. ACDF. Old LEFT rib fractures.
IMPRESSION: 1. Cardiomegaly. Interstitial prominence suggest pulmonary edema
superimposed on chronic interstitial changes.
2. Small RIGHT pleural effusion.  RIGHT lung base atelectasis.

## 2020-09-27 IMAGING — CT CT CHEST W/O CM
2 of 3 series · 14 of 36 positions shown, 17 images · non-contrast
Comparison: Same day CXR and 04/25/2018. Chest CT 01/24/2014

CLINICAL DATA: Dyspnea starting at 2477 hours

EXAM:
CT CHEST WITHOUT CONTRAST
TECHNIQUE: Multidetector CT imaging of the chest was performed following the
standard protocol without IV contrast.

[Series 2: thorax · axial · 0.66mm/px · z∈[-788,-534]mm · 11 of 149 slices shown, 14 images]
[im 11/149  mediastinal]
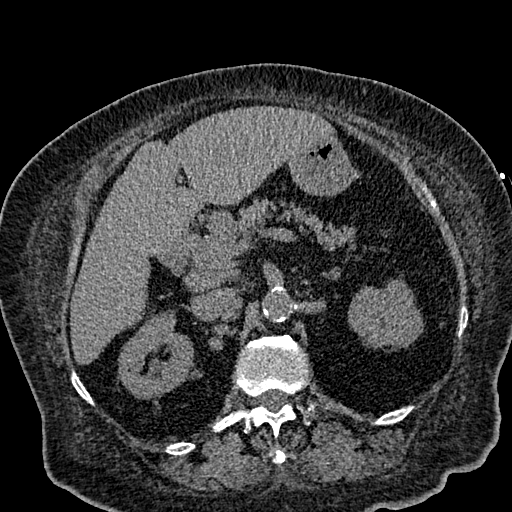
[im 11/149  lung]
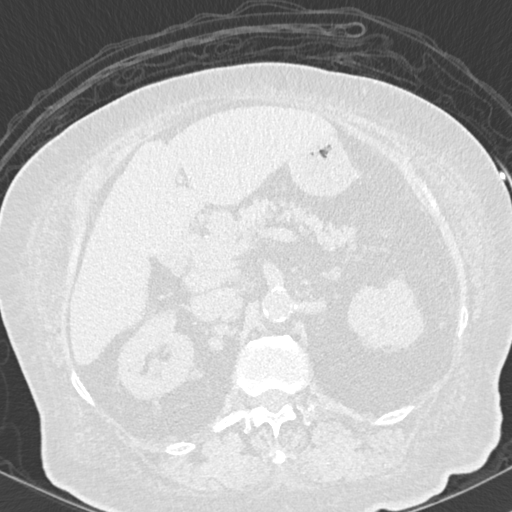
[im 22/149  lung]
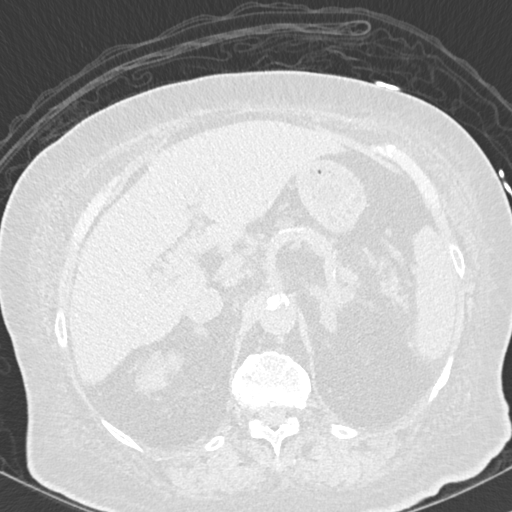
[im 33/149  lung]
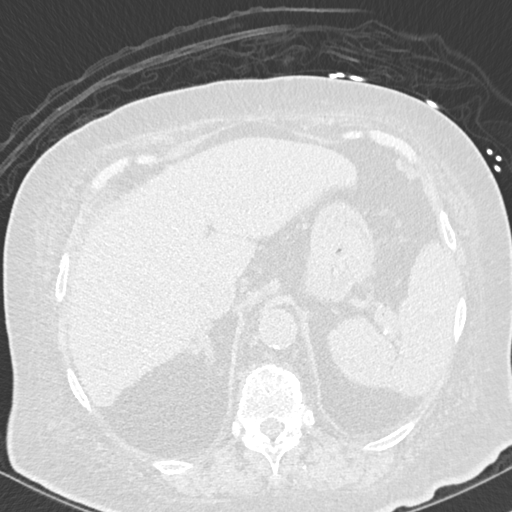
[im 50/149  lung]
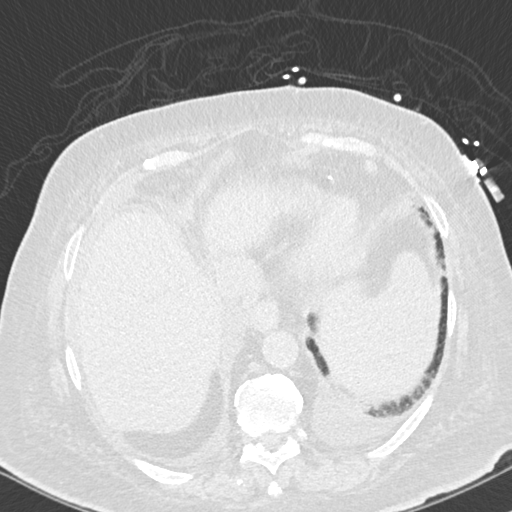
[im 61/149  mediastinal]
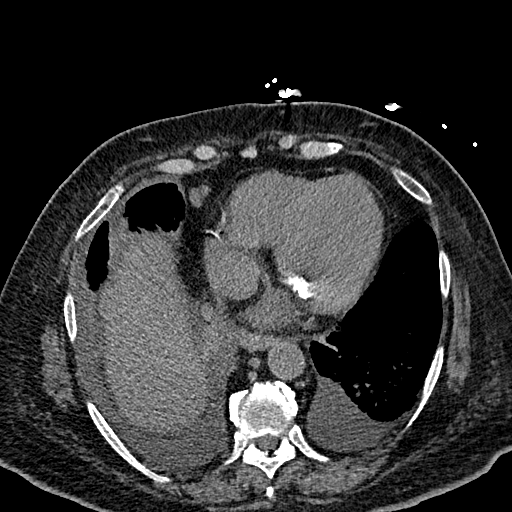
[im 61/149  lung]
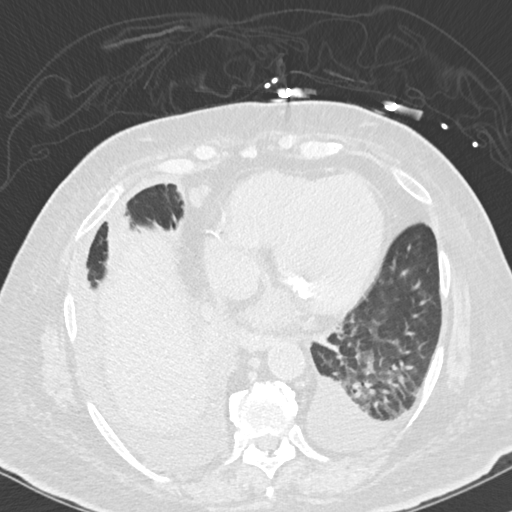
[im 77/149  lung]
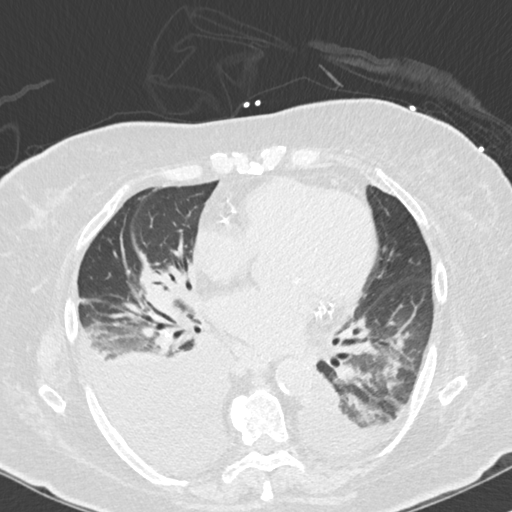
[im 88/149  lung]
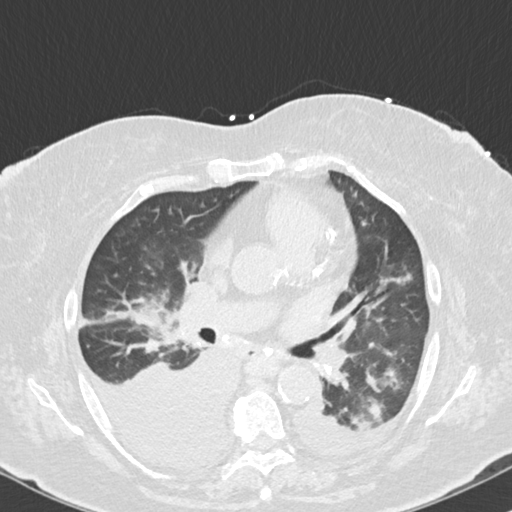
[im 99/149  lung]
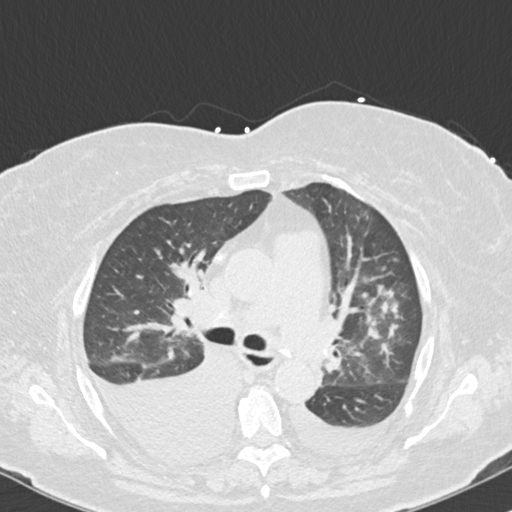
[im 116/149  mediastinal]
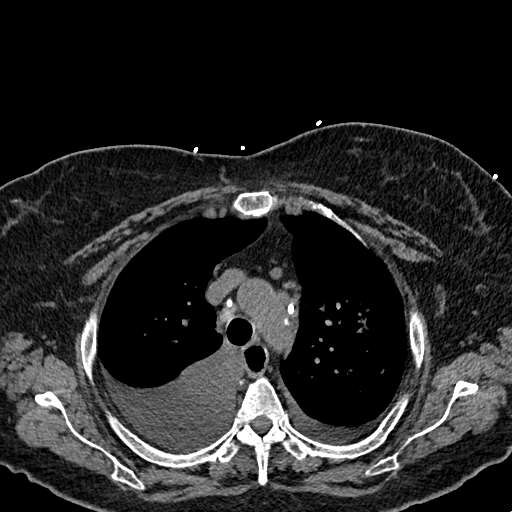
[im 116/149  lung]
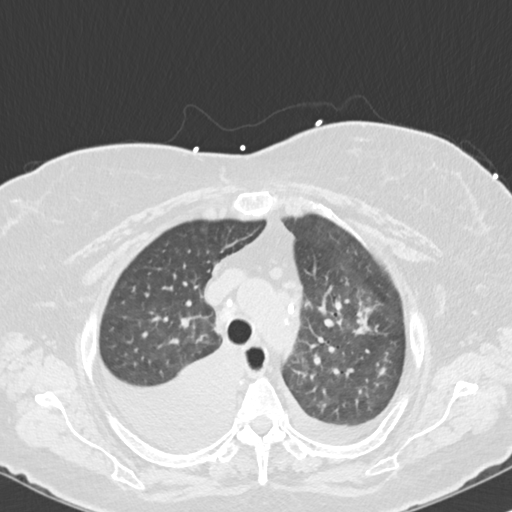
[im 127/149  lung]
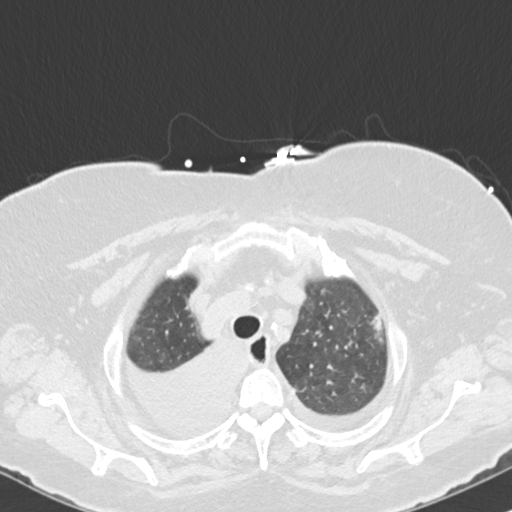
[im 138/149  lung]
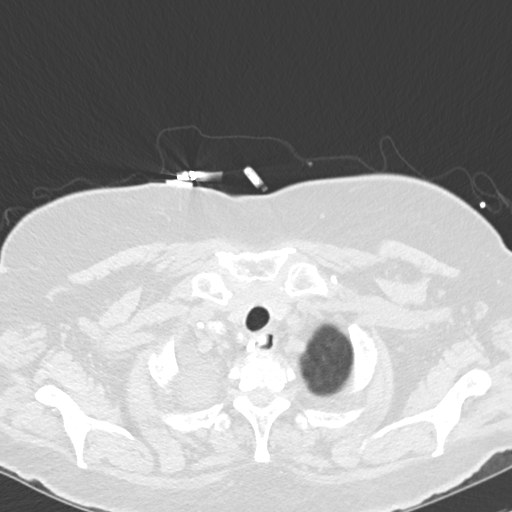

[Series 5: coronal · coronal · 0.61mm/px · 3 of 150 slices shown]
[im 30/150  lung]
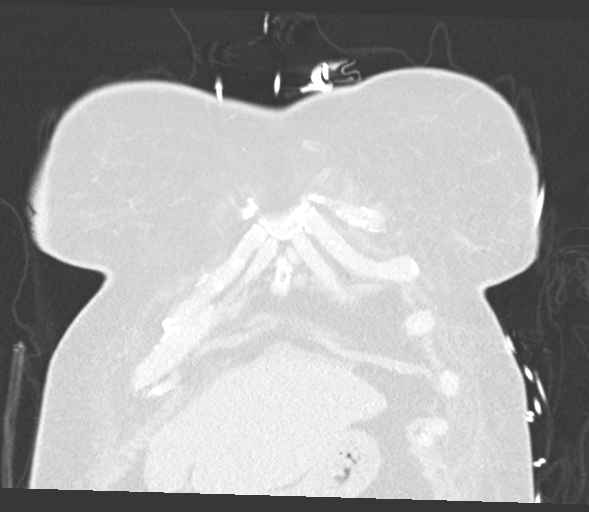
[im 60/150  lung]
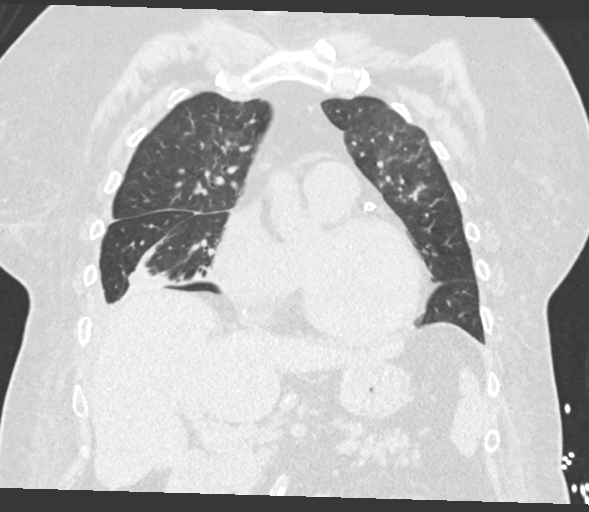
[im 90/150  lung]
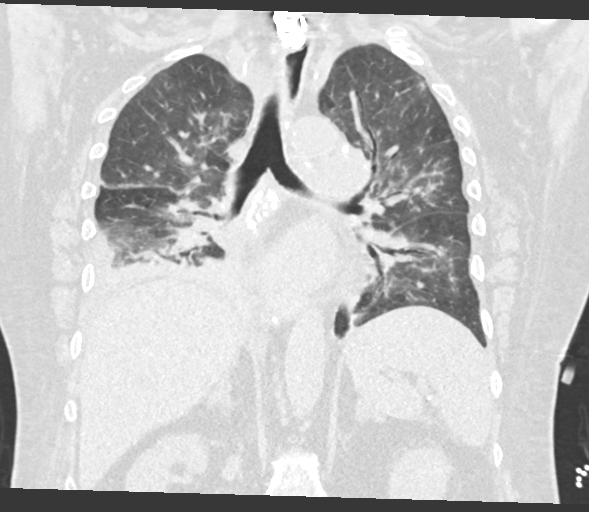

[14 of 36 positions shown; findings below may reference images not displayed]

FINDINGS: Cardiovascular: Cardiomegaly without pericardial effusion. Small
pericardial lymph nodes measuring 9 mm short axis is identified
anteriorly, series [DATE] and 9 mm series [DATE] are noted. Mitral
annular calcifications are identified in addition to left main and
three-vessel coronary arteriosclerosis. Mild-to-moderate aortic
atherosclerosis without aneurysm is identified. Dilatation of the
main pulmonary artery to 3.1 cm consistent with chronic pulmonary
hypertension is identified. Common branch point for the
brachiocephalic and left common carotid arteries. Atherosclerosis is
noted at the origin of the left subclavian artery.

Mediastinum/Nodes: Calcified mediastinal and hilar lymph nodes
compatible with old granulomatous disease. No adenopathy. Trachea
and mainstem bronchi are patent. Air-filled esophagus without mural
thickening is noted. No thyromegaly or mass.

Lungs/Pleura: Moderate right and small left pleural effusions are
identified. Patchy airspace disease in the left upper lobe, lingula
and both lower lobes may represent stigmata of CHF and/or pneumonia.
Follow-up to resolution is recommended. Redemonstration of small
pulmonary nodules, the largest currently up to 5 mm in the right
middle and lower lobes noted, some which appears stable dating back
to 9087.

Upper Abdomen: Calcified granulomata of the spleen. Mild thickening
of the adrenal glands bilaterally query hyperplasia. Atrophy of the
pancreas. No acute abnormality within the upper abdomen.

Musculoskeletal: No chest wall mass or suspicious bone lesions
identified.
IMPRESSION: 1. Moderate right and small left pleural effusions with patchy
airspace disease in the left upper lobe, lingula and both lower
lobes which may represent stigmata of CHF and/or pneumonia.
Follow-up to resolution is recommended.
2. Stigmata of old granulomatous disease with calcified mediastinal
and hilar lymph nodes and calcified granulomata of the spleen. Small
pulmonary nodules are identified on the right measuring up to 5 mm,
some which appears stable dating back to 9087 and may reflect old
granulomatous disease as well given the mediastinal and hilar
calcified lymph nodes.
3. Dilatation of the main pulmonary artery to 3.1 cm consistent with
chronic pulmonary hypertension.
4. Mild thickening of the adrenal glands bilaterally query
hyperplasia.
5. Left main and three-vessel coronary arteriosclerosis.

Aortic Atherosclerosis ([id number]).

## 2020-09-27 IMAGING — DX DG CHEST 1V PORT
1 series · 1 of 1 positions shown · non-contrast
Comparison: 04/25/2018

CLINICAL DATA: Shortness of Breath

EXAM:
PORTABLE CHEST 1 VIEW

[chest ap]
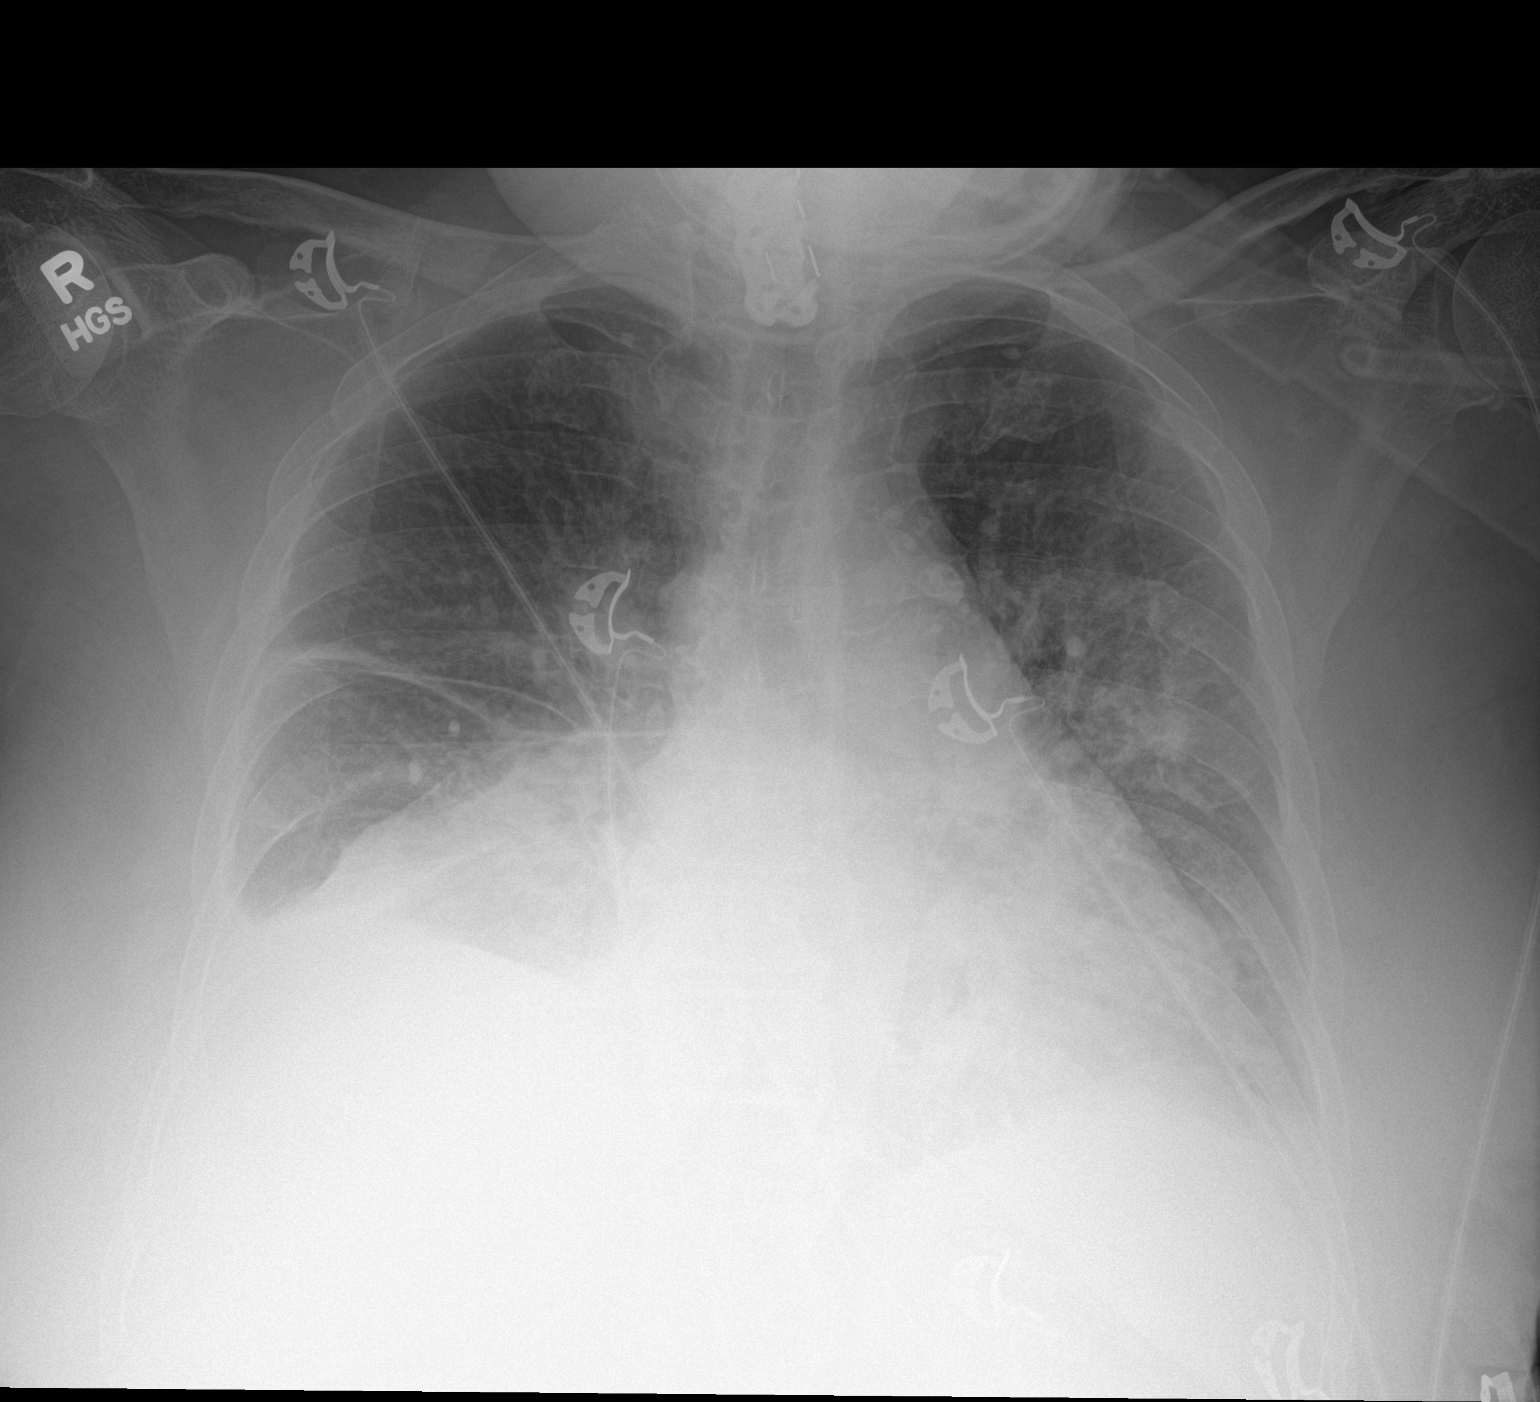

[1 of 1 positions shown; findings below may reference images not displayed]

FINDINGS: Cardiac shadow is mildly enlarged but stable. Increasing
consolidation in the right lung base is noted. Central vascular
congestion with interstitial edema is noted as well. No bony
abnormality is seen.
IMPRESSION: Stable changes of CHF.

New right basilar consolidation.

## 2020-09-29 IMAGING — CR DG CHEST 2V
1 series · 2 of 2 positions shown · non-contrast
Comparison: Chest radiograph and CT from 06/05/2017

CLINICAL DATA: 77-year-old with acute respiratory failure.

EXAM:
CHEST - 2 VIEW

[Series 1: dg chest 2 view · 0.14mm/px · 2 of 2 slices shown]
[im 1/2]
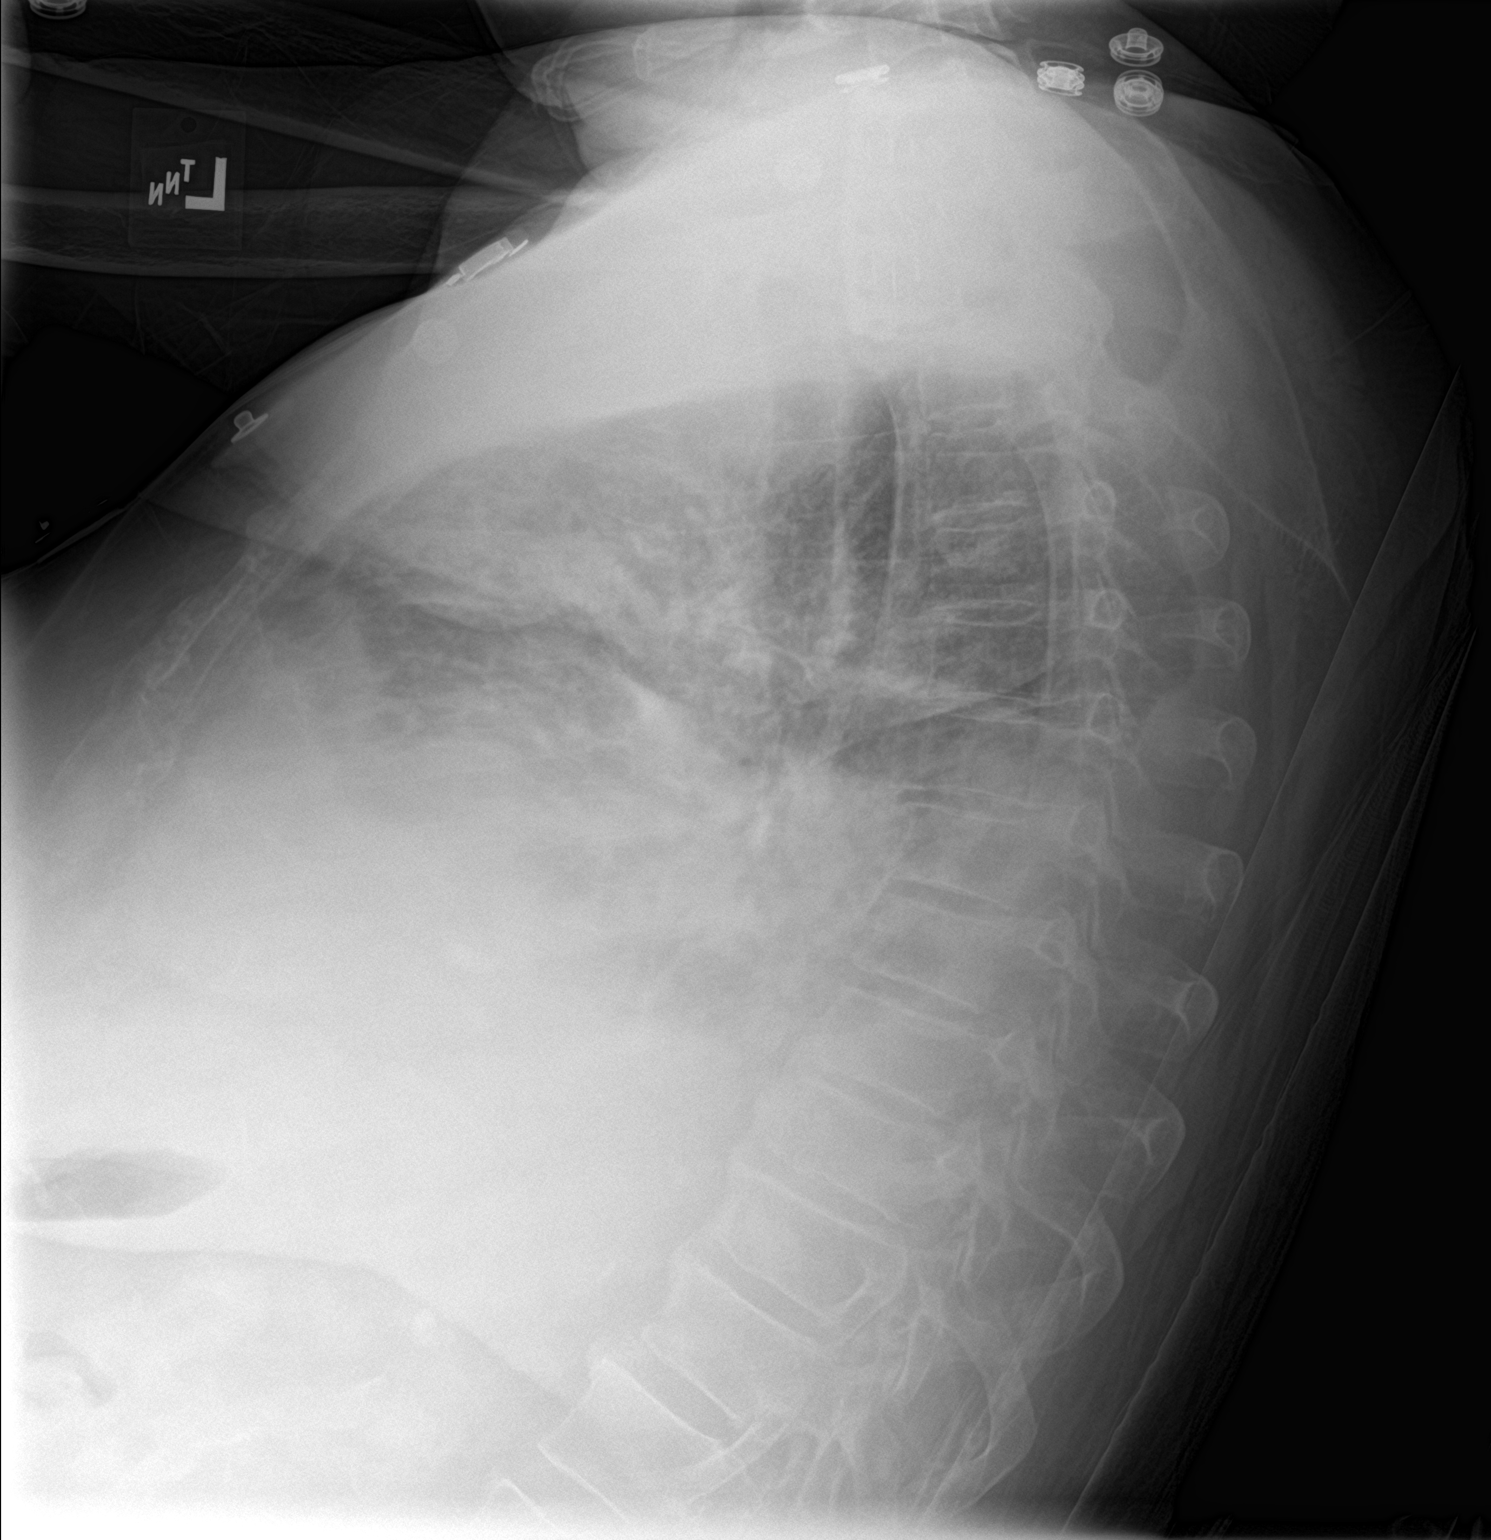
[im 2/2]
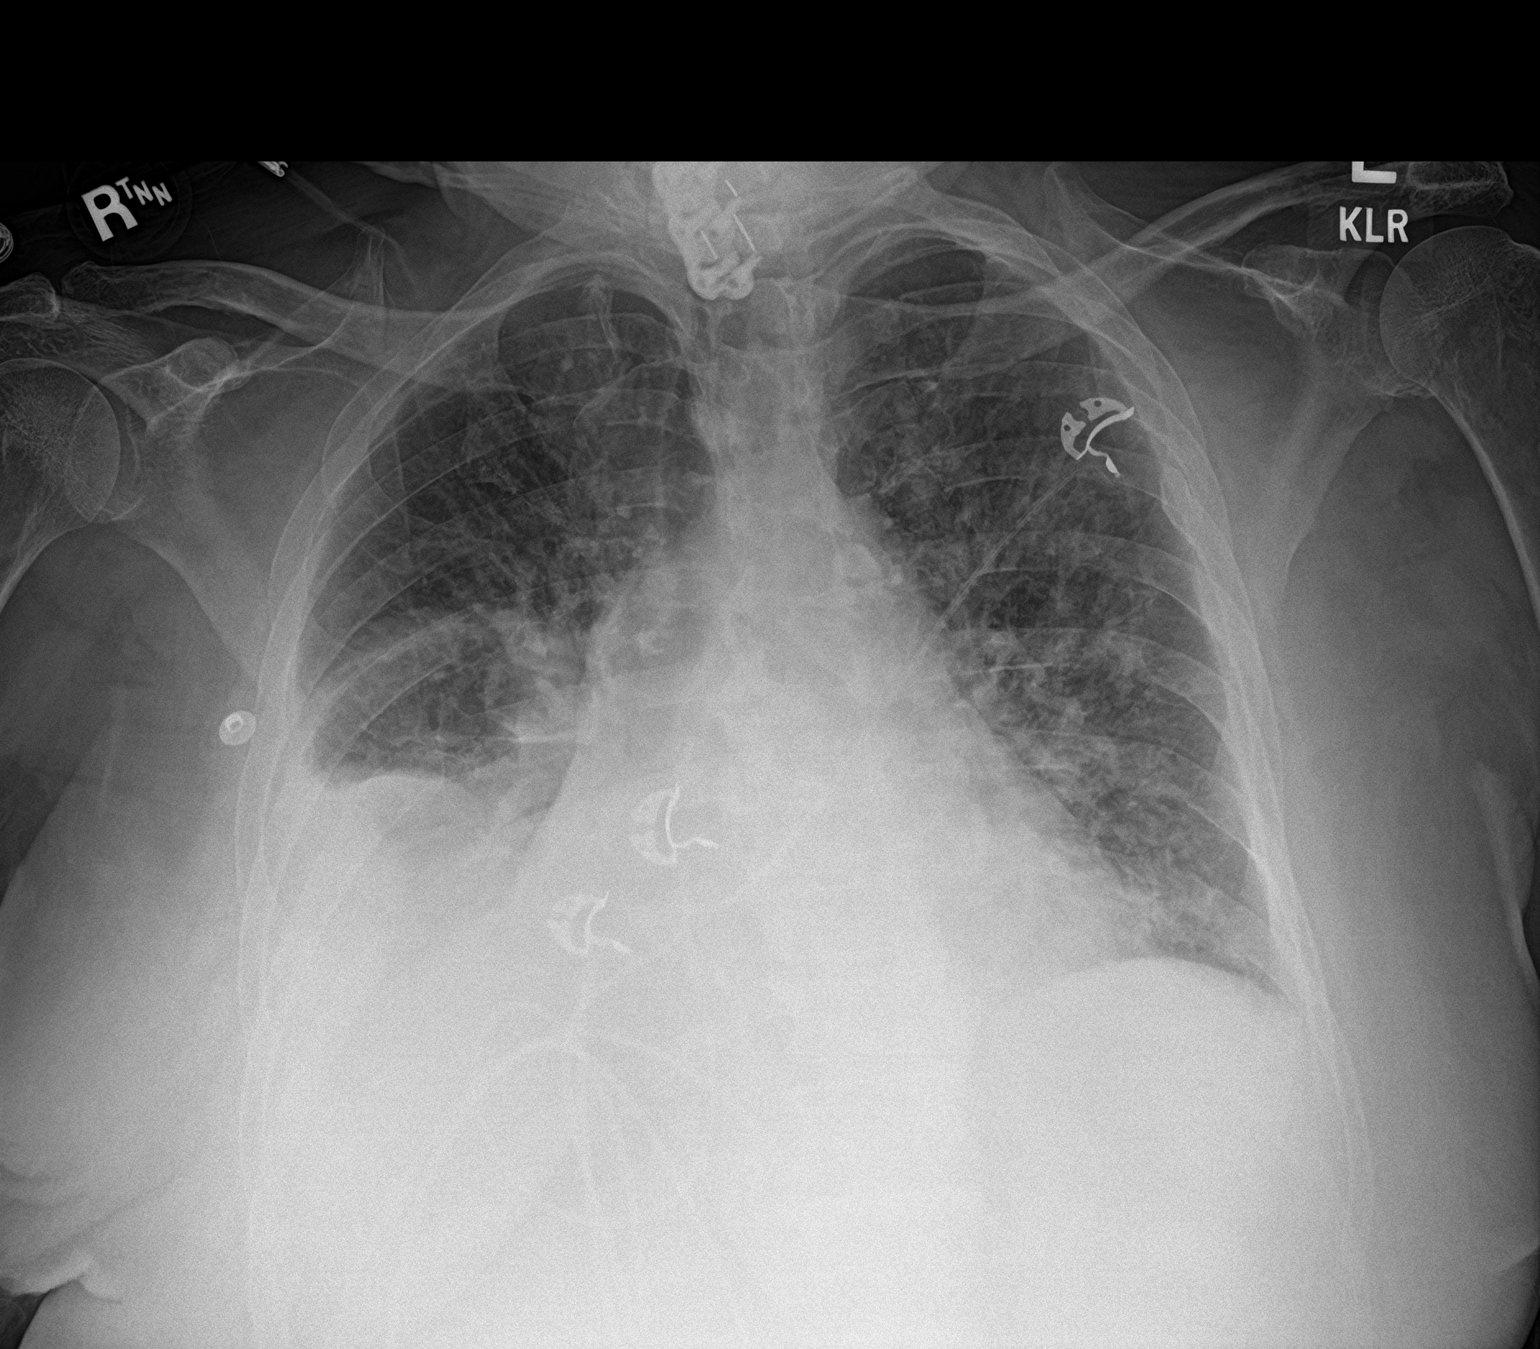

[2 of 2 positions shown; findings below may reference images not displayed]

FINDINGS: Patchy parenchymal densities in both lungs, left side greater than
right. Again noted are right basilar densities compatible with
pleural effusion and atelectasis. Right pleural effusion may have
enlarged. Heart size is upper limits of normal but stable. Surgical
changes in the cervical spine. Old left fifth rib fracture.
IMPRESSION: Persistent bilateral parenchymal lung densities that are suggestive
for pneumonia and/or edema.

Densities in the right lower chest are suggestive for pleural fluid
with compressive atelectasis. Can not exclude enlargement of the
right pleural effusion.

## 2020-09-29 IMAGING — US US THORACENTESIS ASP PLEURAL SPACE W/IMG GUIDE
1 series · 3 of 3 positions shown · non-contrast
Comparison: None.

CLINICAL DATA: Right pleural effusion.

EXAM:
ULTRASOUND GUIDED RIGHT THORACENTESIS

[Series 1: us thoracentesis asp pleural space w/img guide · 3 of 3 slices shown]
[im 1/3]
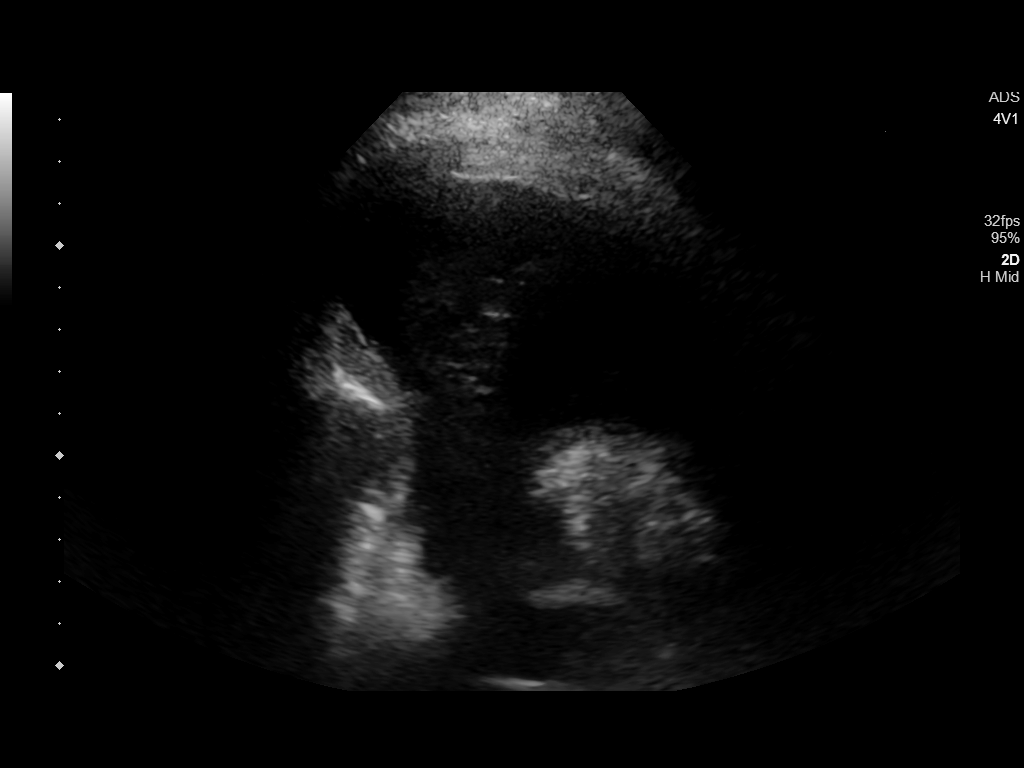
[im 2/3]
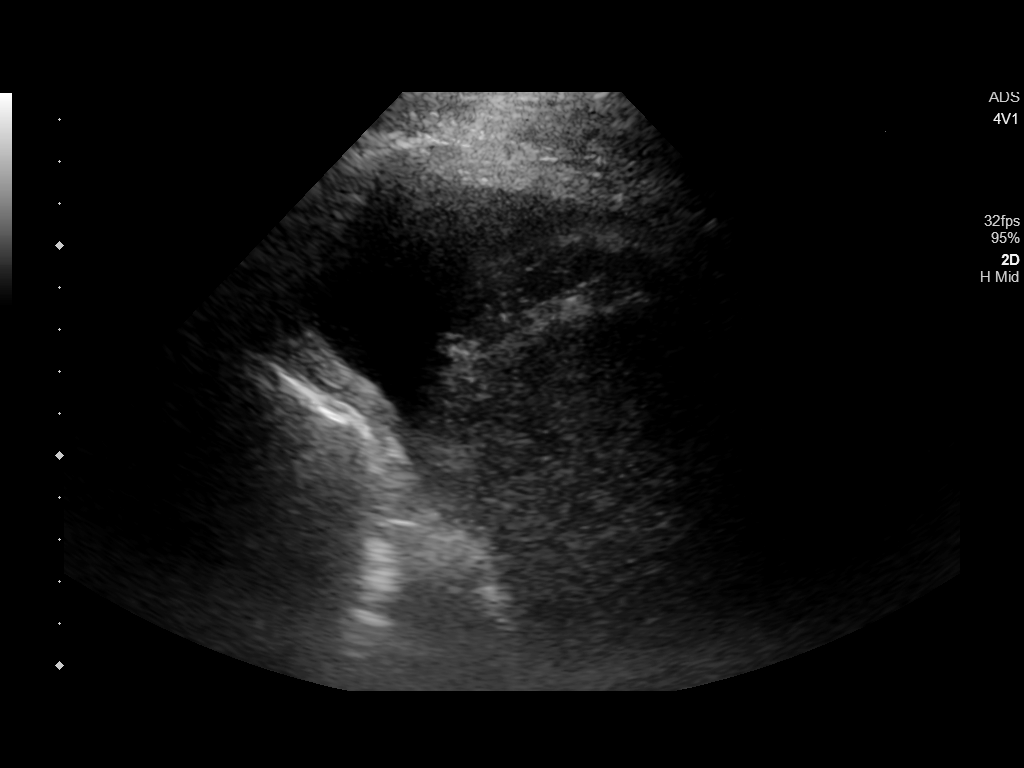
[im 3/3]
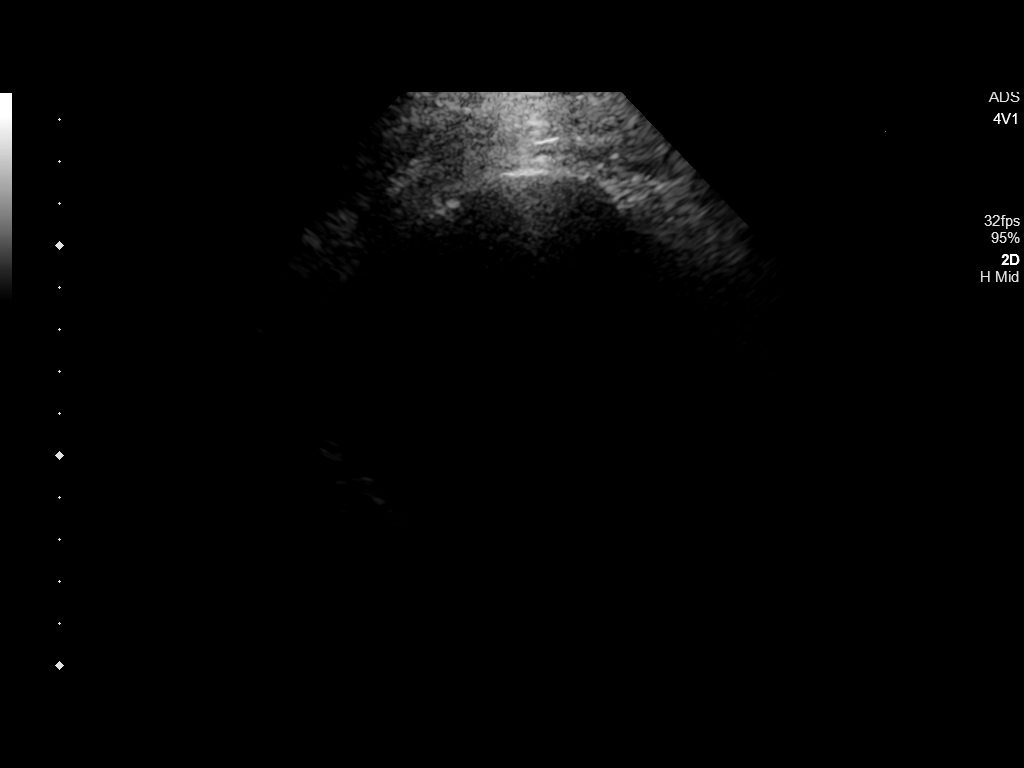

[3 of 3 positions shown; findings below may reference images not displayed]

PROCEDURE:
An ultrasound guided thoracentesis was thoroughly discussed with the
patient and questions answered. The benefits, risks, alternatives
and complications were also discussed. The patient understands and
wishes to proceed with the procedure. Written consent was obtained.
A time-out was performed prior to initiating the procedure.

Ultrasound was performed to localize and mark an adequate pocket of
fluid in the right chest. The area was then prepped and draped in
the normal sterile fashion. 1% Lidocaine was used for local
anesthesia. Under ultrasound guidance a 6 French Safe-T-Centesis
catheter was introduced. Thoracentesis was performed. The catheter
was removed and a dressing applied.

COMPLICATIONS:
None
FINDINGS: A total of approximately 750 mL of amber colored fluid was removed.
IMPRESSION: Successful ultrasound guided right thoracentesis yielding 750 mL of
pleural fluid.

## 2020-09-29 IMAGING — DX DG CHEST 1V
1 series · 1 of 1 positions shown · non-contrast
Comparison: 06/07/2018, [DATE] hours

CLINICAL DATA: Status post right thoracentesis.

EXAM:
CHEST  1 VIEW

[chest ap]
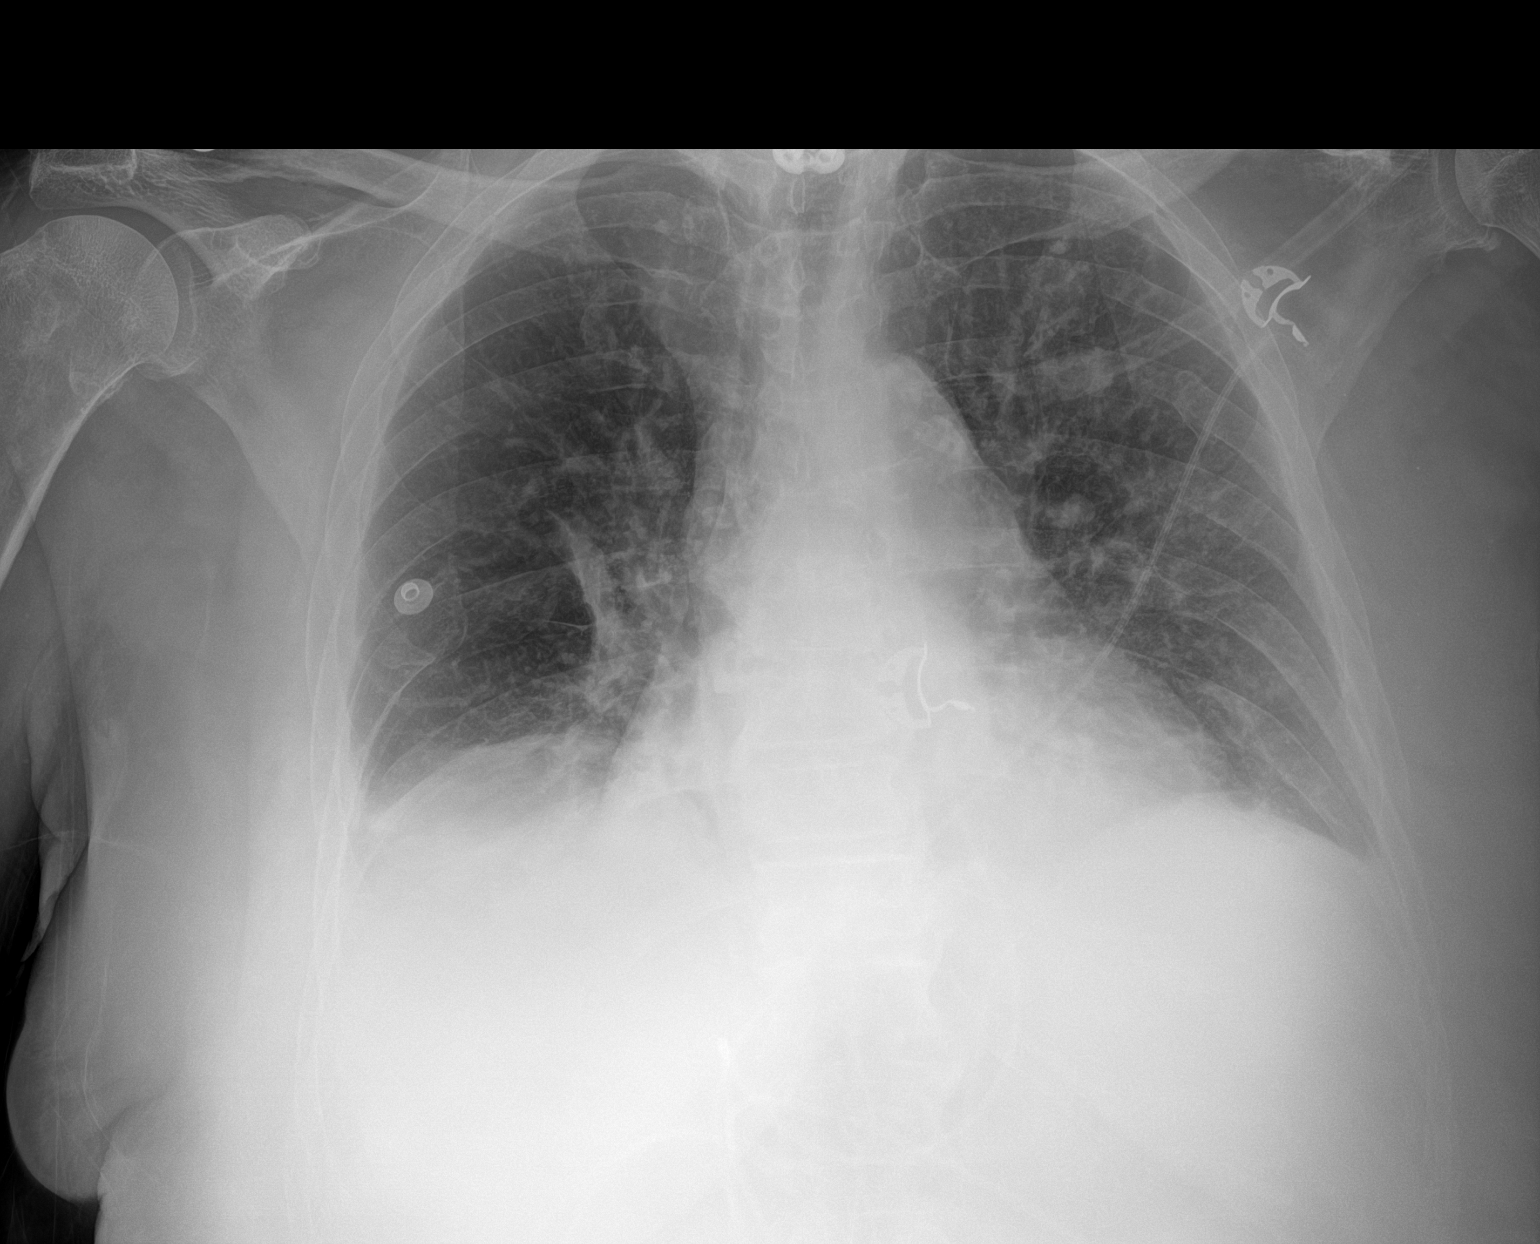

[1 of 1 positions shown; findings below may reference images not displayed]

FINDINGS: After thoracentesis, no significant right pleural fluid remains.
Improved aeration of the right lung. No pneumothorax. Stable heart
size.
IMPRESSION: No significant residual right pleural fluid after thoracentesis.
Improved aeration of the right lung noted with no evidence of
pneumothorax.

## 2020-10-22 IMAGING — CR DG LUMBAR SPINE COMPLETE 4+V
1 series · 5 of 5 positions shown · non-contrast
Comparison: CT scan 03/19/2018

CLINICAL DATA: Low back pain, fall yesterday

EXAM:
LUMBAR SPINE - COMPLETE 4+ VIEW

[Series 5: t lumbar spine obl · 0.14mm/px · 5 of 5 slices shown]
[im 1/5]
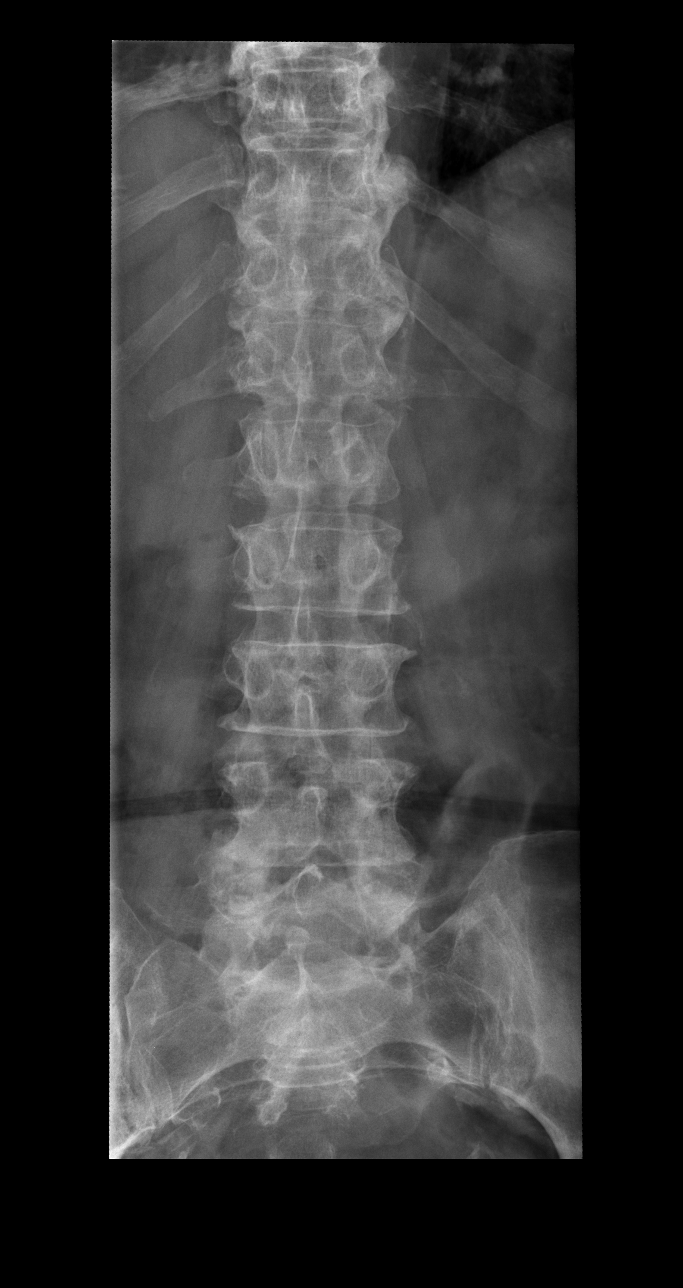
[im 2/5]
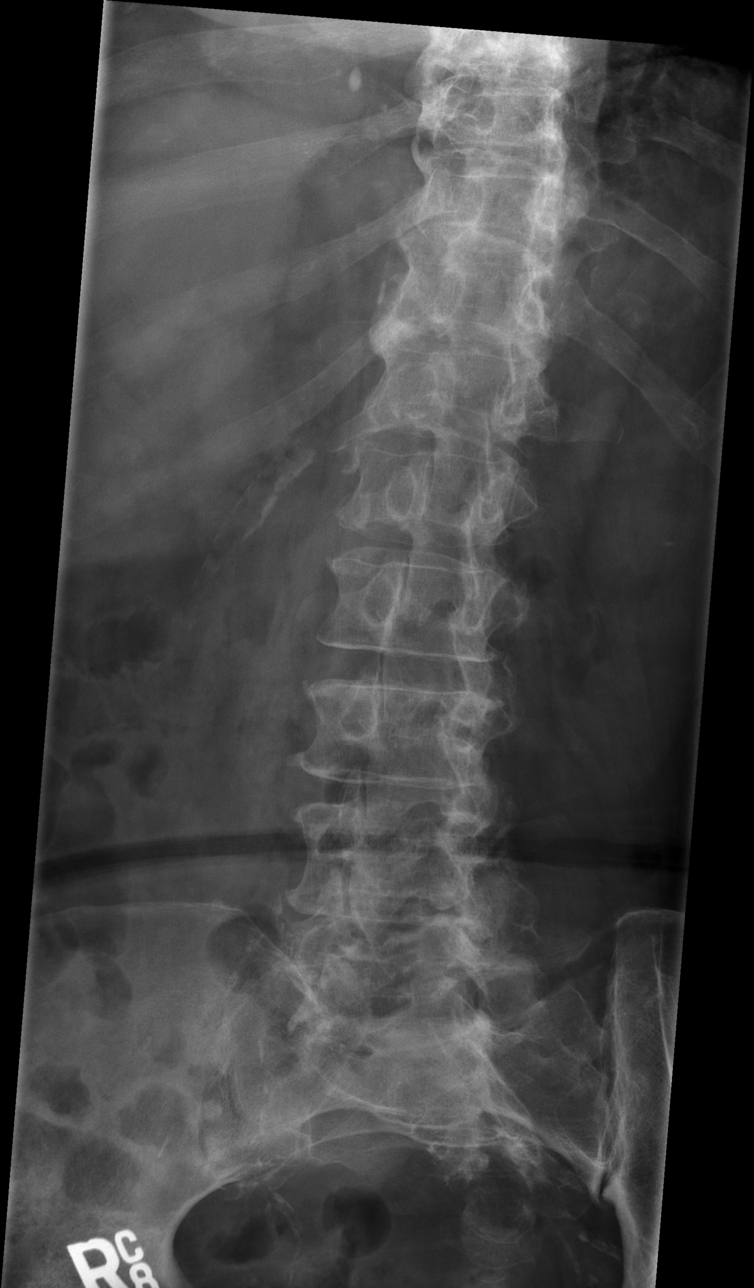
[im 3/5]
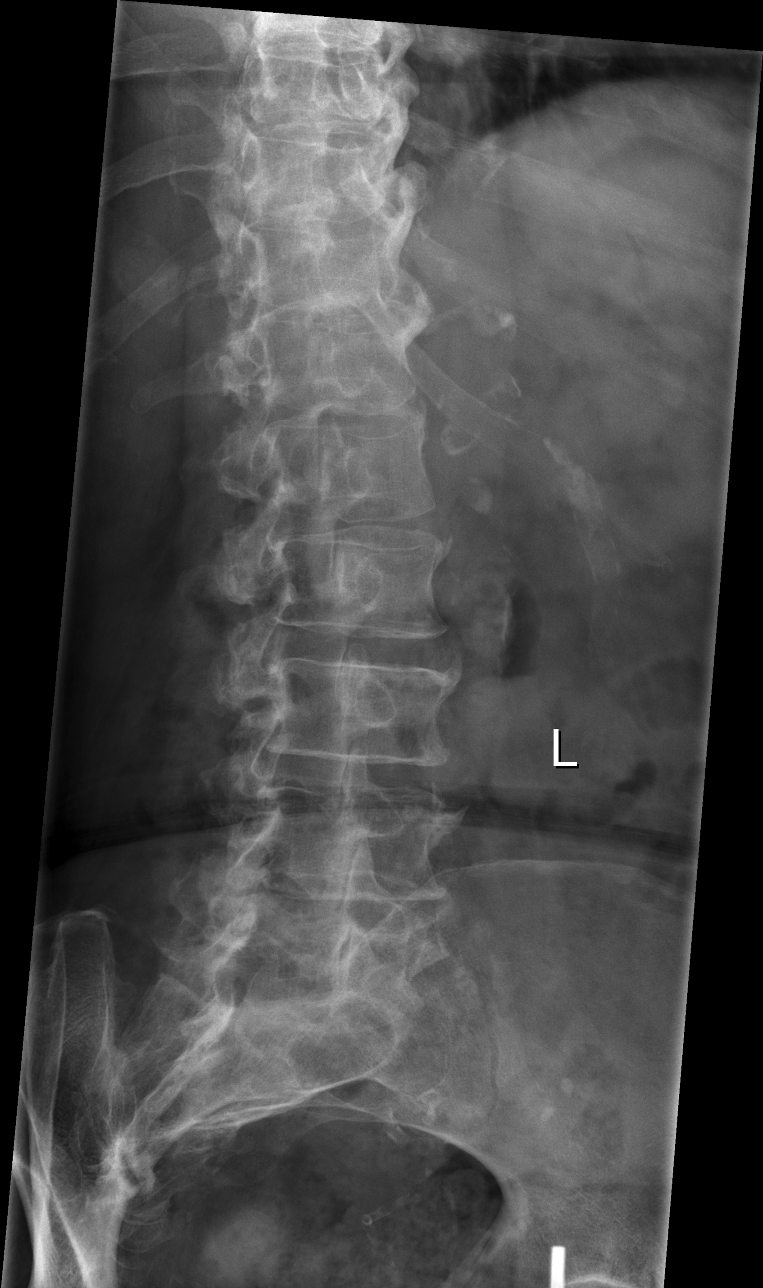
[im 4/5]
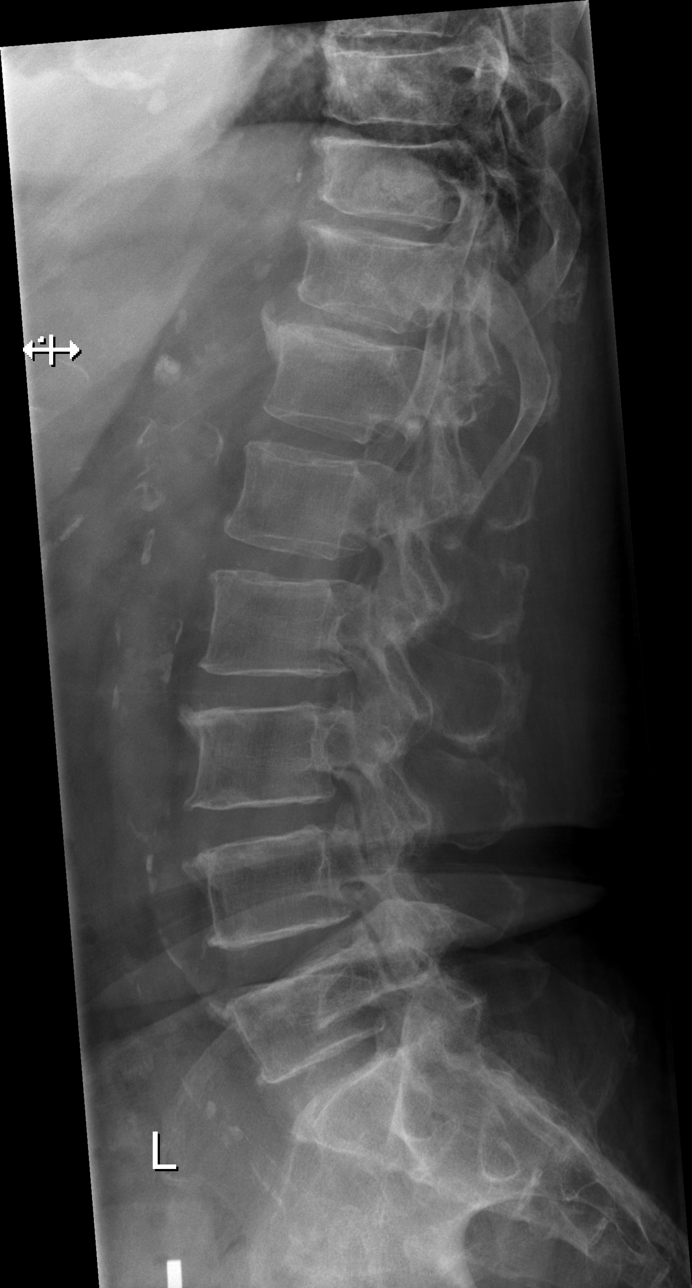
[im 5/5]
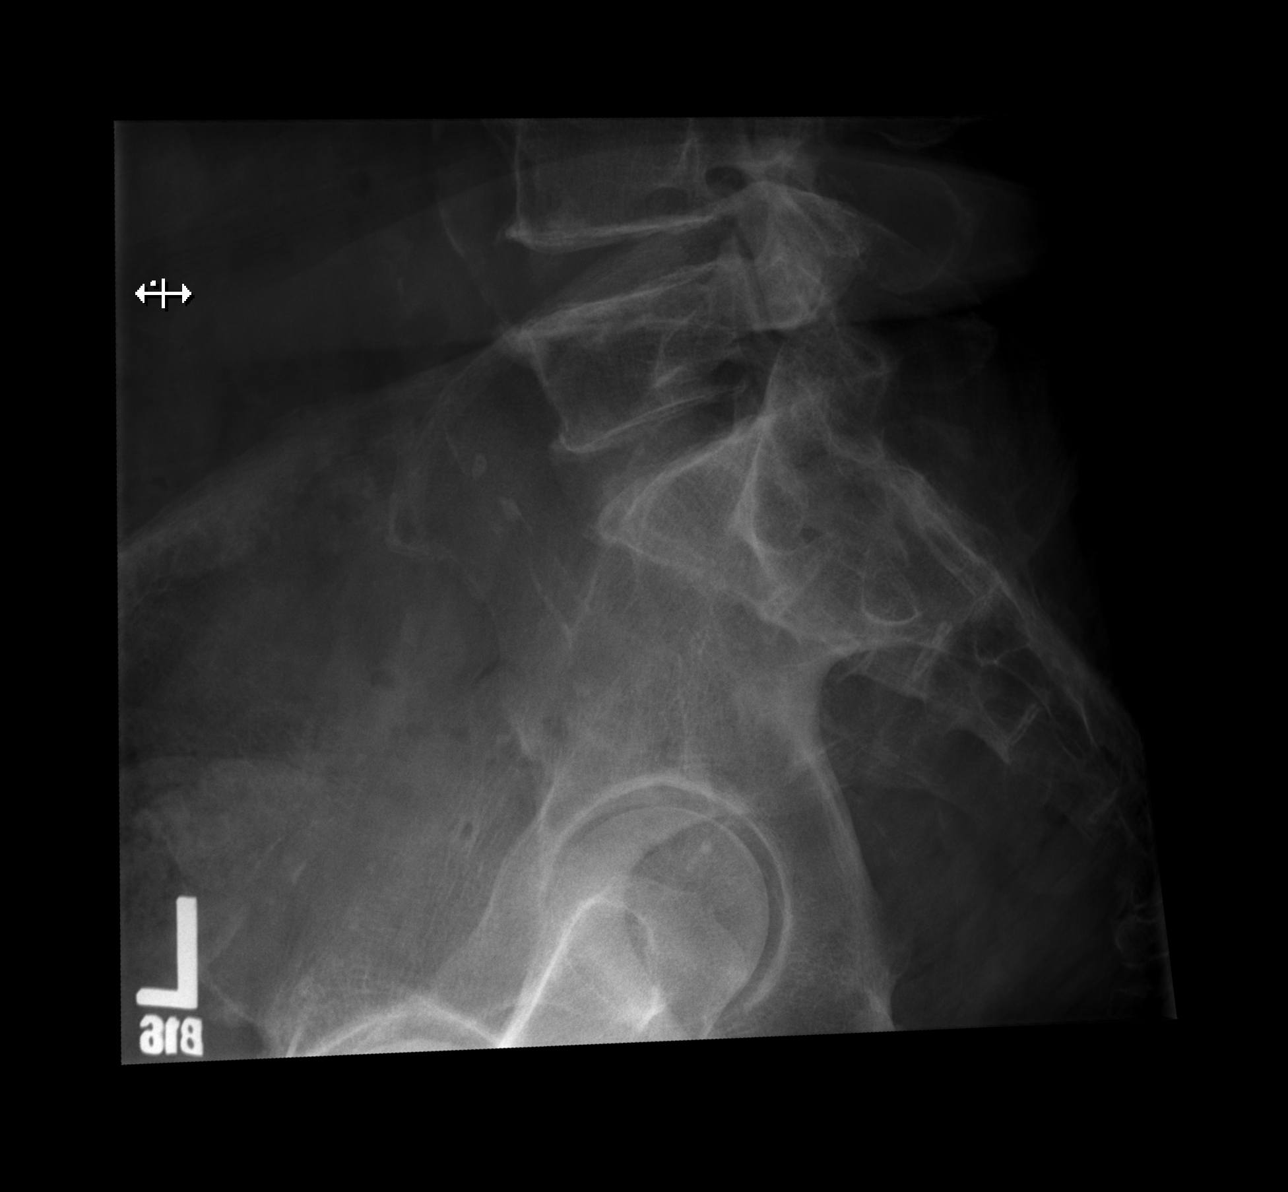

[5 of 5 positions shown; findings below may reference images not displayed]

FINDINGS: Preserved intervertebral disc spaces. Lower thoracic and lumbar
spondylosis noted without appreciable fracture or subluxation.

Aortoiliac atherosclerotic vascular disease. Degenerative facet
arthropathy bilaterally at L4-5 and L5-S1.
IMPRESSION: 1. Lower thoracic and lumbar spondylosis noted. No appreciable
fracture or acute subluxation
2.  Aortic Atherosclerosis ([id number]).

## 2020-11-21 IMAGING — CT CT CHEST WITHOUT CONTRAST
2 of 4 series · 13 of 36 positions shown, 16 images · non-contrast
Comparison: Comparison made with prior radiograph from earlier the
same day as well as prior chest CT from 06/05/2018.

CLINICAL DATA: Initial evaluation for back pain. History of COPD,
cardiac stent.

EXAM:
CT CHEST WITHOUT CONTRAST
CT THORACIC SPINE WITHOUT CONTRAST
TECHNIQUE: Multidetector CT imaging of the chest was performed following the
standard protocol without IV contrast.

[Series 3: thorax · axial · 0.70mm/px · z∈[-679,-415]mm · 10 of 154 slices shown, 13 images]
[im 11/154  mediastinal]
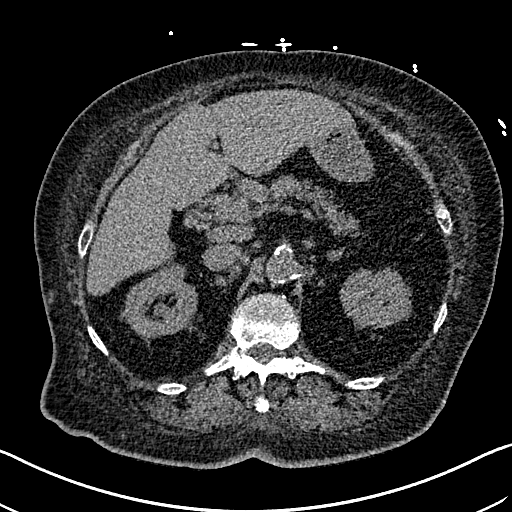
[im 11/154  lung]
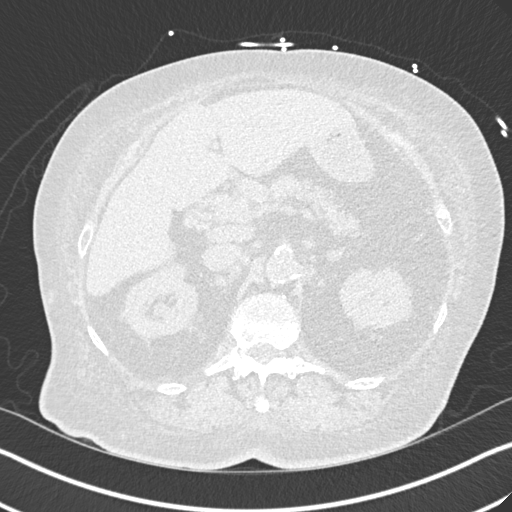
[im 22/154  lung]
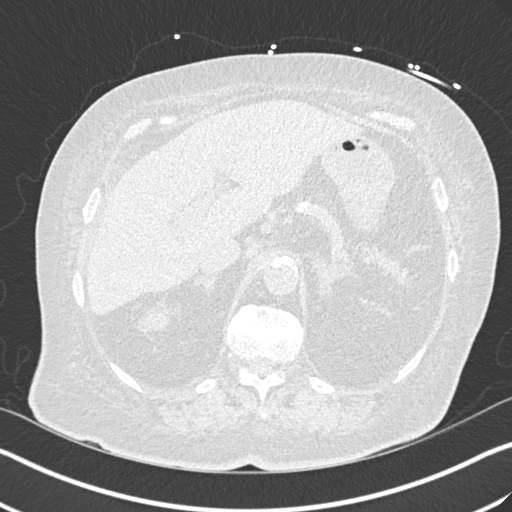
[im 44/154  lung]
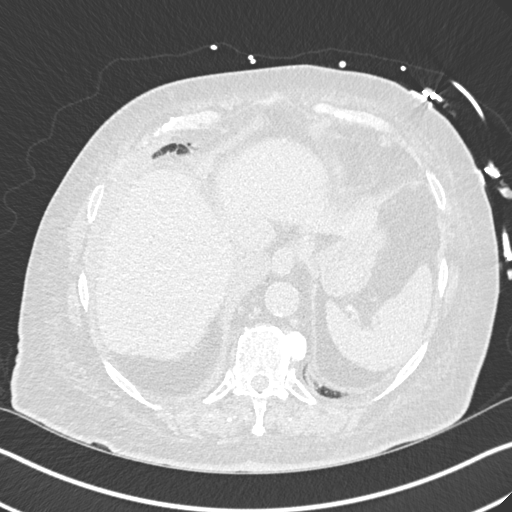
[im 55/154  lung]
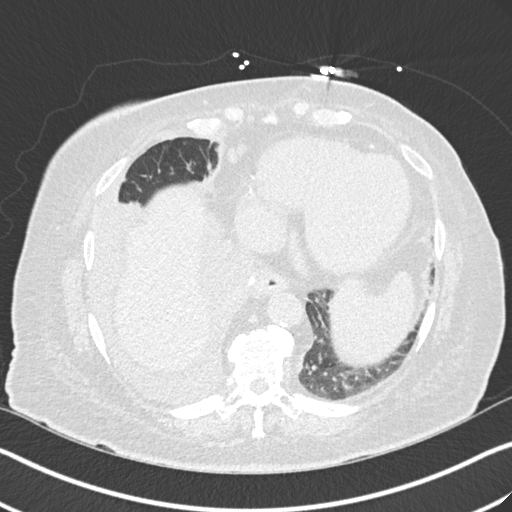
[im 66/154  mediastinal]
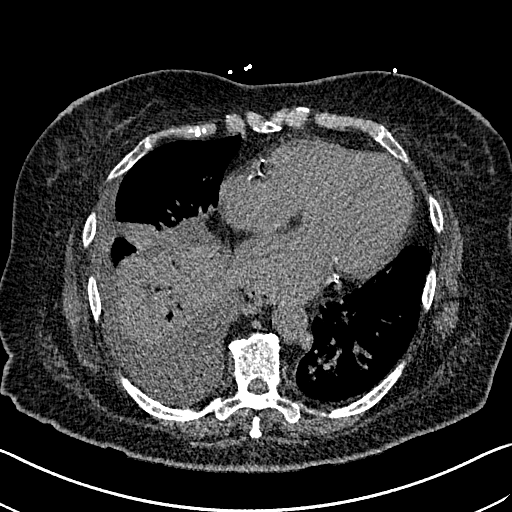
[im 66/154  lung]
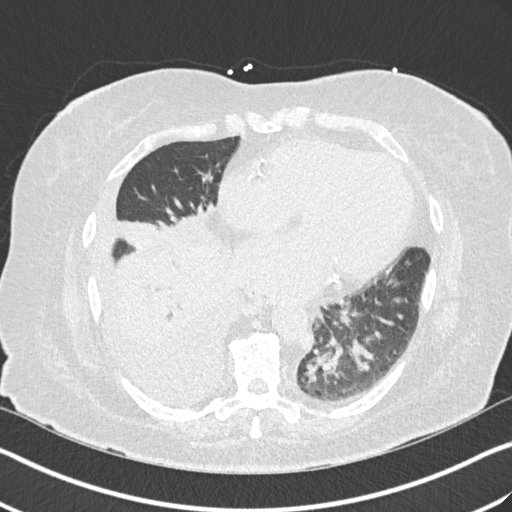
[im 88/154  lung]
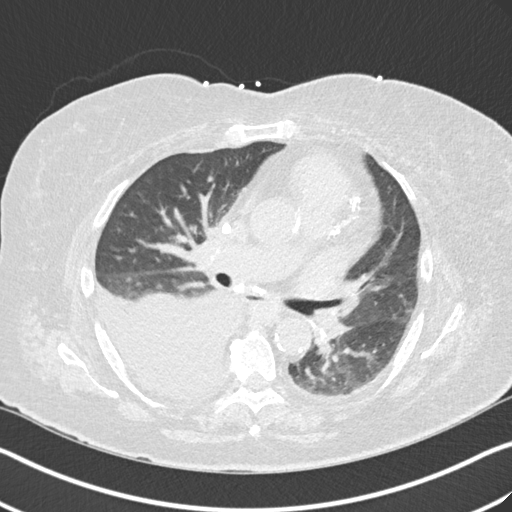
[im 99/154  lung]
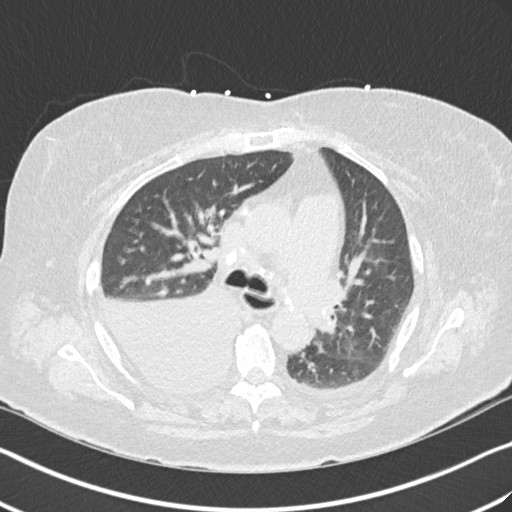
[im 110/154  lung]
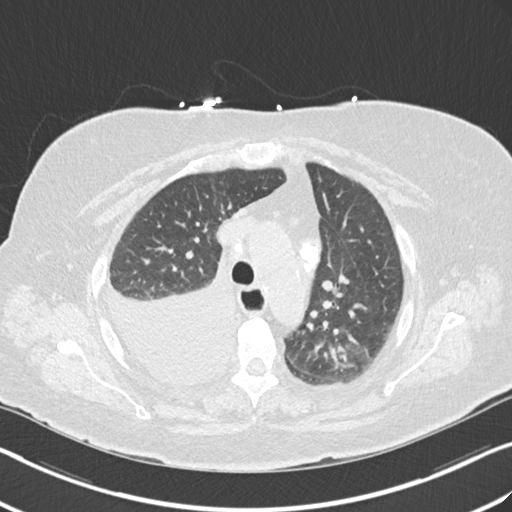
[im 132/154  mediastinal]
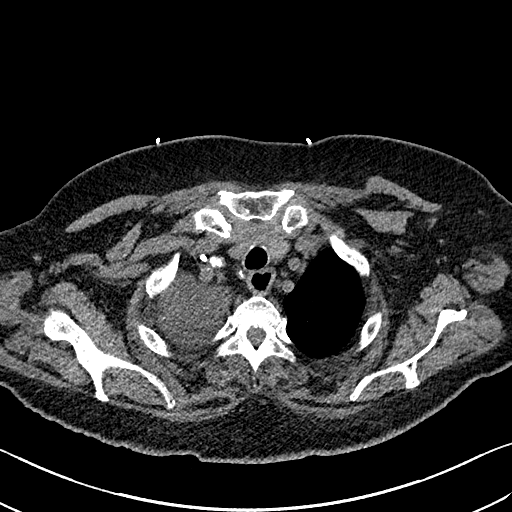
[im 132/154  lung]
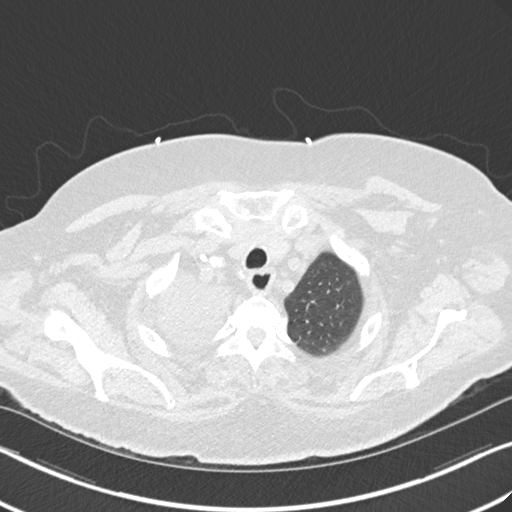
[im 143/154  lung]
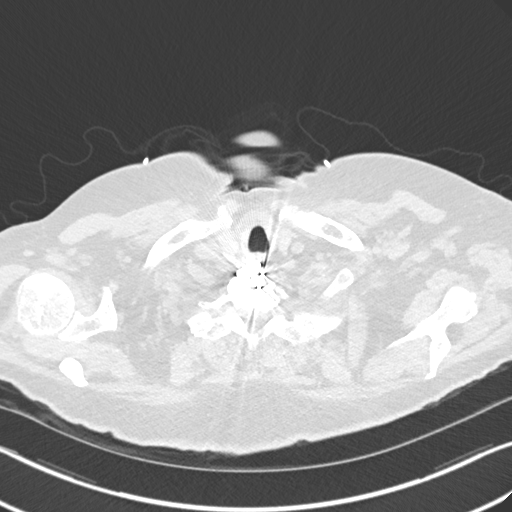

[Series 6: coronal · coronal · 0.66mm/px · 3 of 130 slices shown]
[im 26/130  lung]
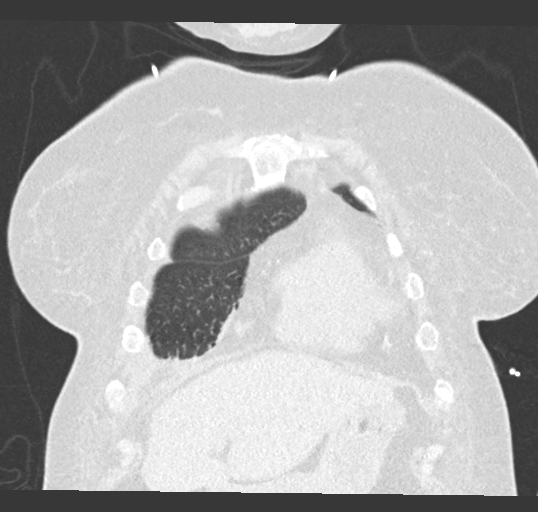
[im 52/130  lung]
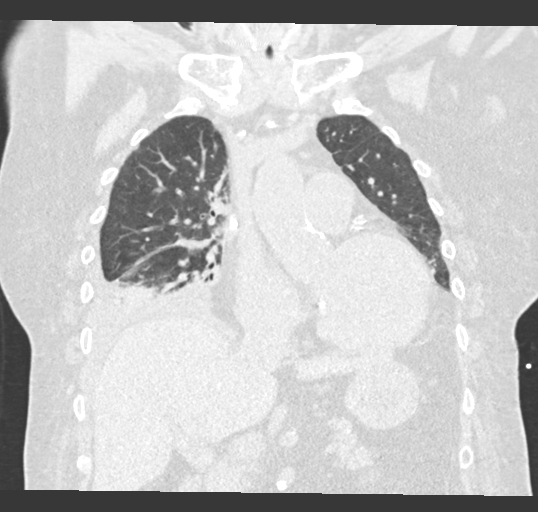
[im 78/130  lung]
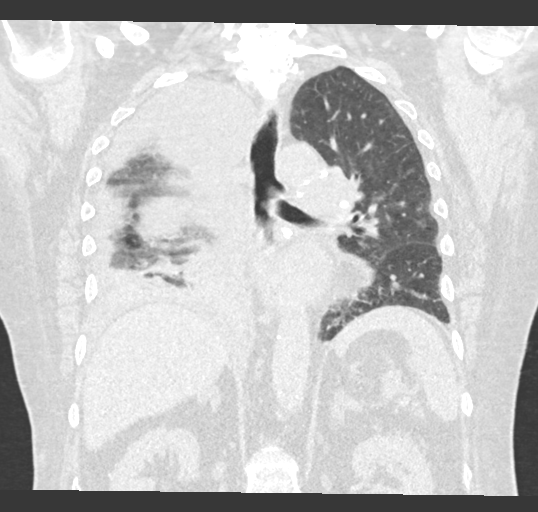

[13 of 36 positions shown; findings below may reference images not displayed]

FINDINGS: Cardiovascular: Intrathoracic aorta normal in caliber without
aneurysm or visible acute finding on this noncontrast examination.
Moderate atherosclerosis. Visualized great vessels normal in caliber
without obvious abnormality. Cardiomegaly with 3 vessel coronary
artery calcifications. No pericardial effusion. Main pulmonary
artery dilated up to 3.1 cm, suggesting chronic underlying pulmonary
hypertension, similar to previous.

Mediastinum/Nodes: Thyroid normal. Multiple predominantly
subcentimeter calcified lymph nodes seen throughout the mediastinum
and bilateral hila, compatible with prior granulomatous infection.
No enlarged axillary nodes. Overall, appearance is stable from
previous. No new or concerning adenopathy. Trace layering secretions
noted within the esophageal lumen. Esophagus otherwise unremarkable.

Lungs/Pleura: Tracheobronchial tree intact and patent. Large
layering right pleural effusion with associated sub pulmonic
component. Small layering left effusion present as well. Associated
consolidative opacities present within the right middle and lower
lobes. Mild hazy opacities within the posterior left upper and lower
lobes favored to reflect atelectasis and/or mild congestion. No
pneumothorax. 5 mm right lower lobe nodule stable from previous,
likely related to prior granulomatous disease. No other new
worrisome pulmonary nodule or mass.

Upper Abdomen: Visualized upper abdomen demonstrates no acute
finding. Adrenal glands are somewhat diffusely enlarged bilaterally,
which could reflect underlying adrenal hyperplasia, stable from
previous. Scattered prominent gastrohepatic, porta hepatis, and
aortocaval lymph nodes noted, again likely related to prior
granulomatous infection. Few punctate calcified granulomas noted
within the spleen.

Musculoskeletal: External soft tissues demonstrate no acute finding.
No acute osseous abnormality. No discrete lytic or blastic osseous
lesions. Remotely healed fracture of the left posterolateral sixth
rib noted. Visualized external soft tissues demonstrate no acute
finding.

CT THORACIC SPINE

Alignment: Vertebral bodies normally aligned with preservation of
the normal thoracic kyphosis. No listhesis or malalignment.

Vertebrae: Cervical ACDF partially visualized. Vertebral body
heights well maintained without evidence for acute or chronic
fracture. No discrete lytic or blastic osseous lesions. Visualized
ribs intact and normal.

Paraspinal and other soft tissues: Paraspinous soft tissues
demonstrate no acute finding.

Disc levels: Multilevel degenerative endplate spurring seen
throughout the thoracic spine, most notable within the mid and lower
aspect. Mild scattered posterior element hypertrophy, most notable
at T10-11. No significant disc pathology. No significant spinal
stenosis or significant bony foraminal narrowing.
IMPRESSION: CT CHEST IMPRESSION:

1. Large right with small left layering pleural effusions.
Associated consolidative opacity involving the right middle and
lower lobes felt to be most consistent with compressive atelectasis,
although superimposed infiltrate could be considered in the correct
clinical setting.
2. Sequelae of chronic granulomatous disease with multiple calcified
mediastinal and hilar adenopathy, with calcified granulomata of the
spleen.
3. Dilatation of the main pulmonary artery up to 3.1 cm, suggesting
a degree of underlying pulmonary hypertension, stable from previous
and likely chronic.
4. Cardiomegaly with diffuse 3 vessel coronary artery calcifications
with moderate aortic atherosclerosis.
5. Cardiomegaly with 3 vessel coronary artery calcifications.
6. Mild thickening of the adrenal glands bilaterally, which could
reflect an underlying degree of adrenal hyperplasia.

CT THORACIC SPINE IMPRESSION:

1. No fracture or other acute abnormality within the thoracic spine.
2. Moderate chronic endplate degenerative spurring throughout the
thoracic spine. No appreciable significant disc pathology or
stenosis.
3. Mild multilevel facet hypertrophy, most notable at T10-11, which
could contribute to underlying back pain.

## 2020-11-21 IMAGING — US US THORACENTESIS ASP PLEURAL SPACE W/IMG GUIDE
1 series · 4 of 4 positions shown · non-contrast
Comparison: none

INDICATION: Right effusion

[Series 1: us thoracentesis asp pleural space w/img guide · 4 of 4 slices shown]
[im 1/4]
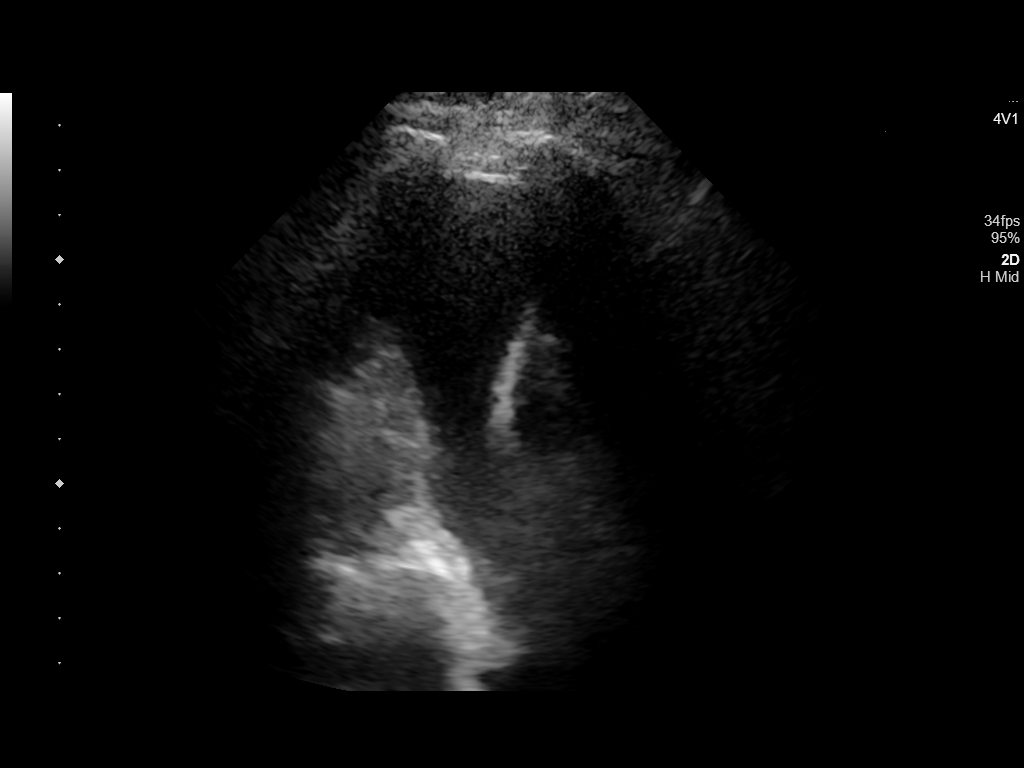
[im 2/4]
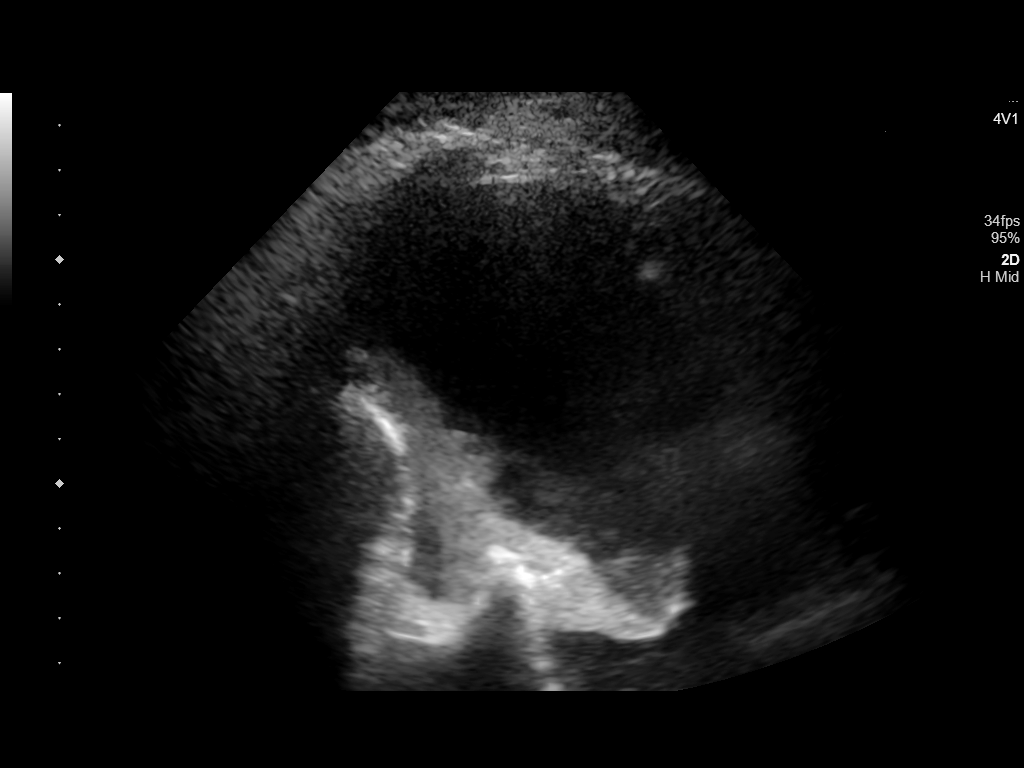
[im 3/4]
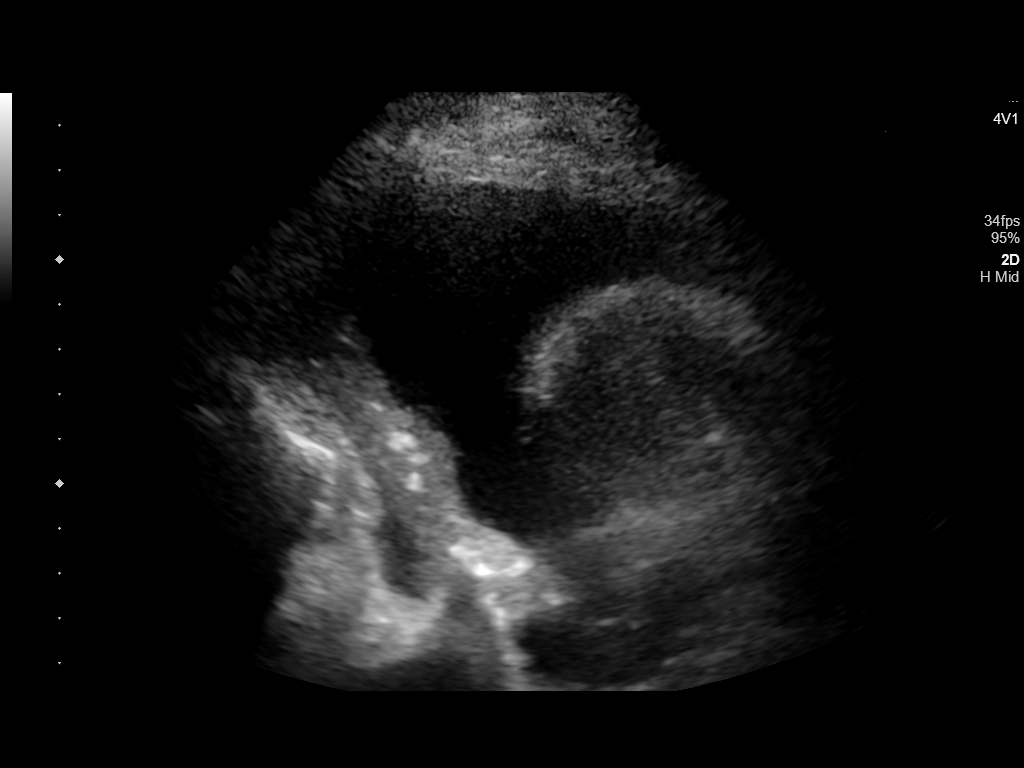
[im 4/4]
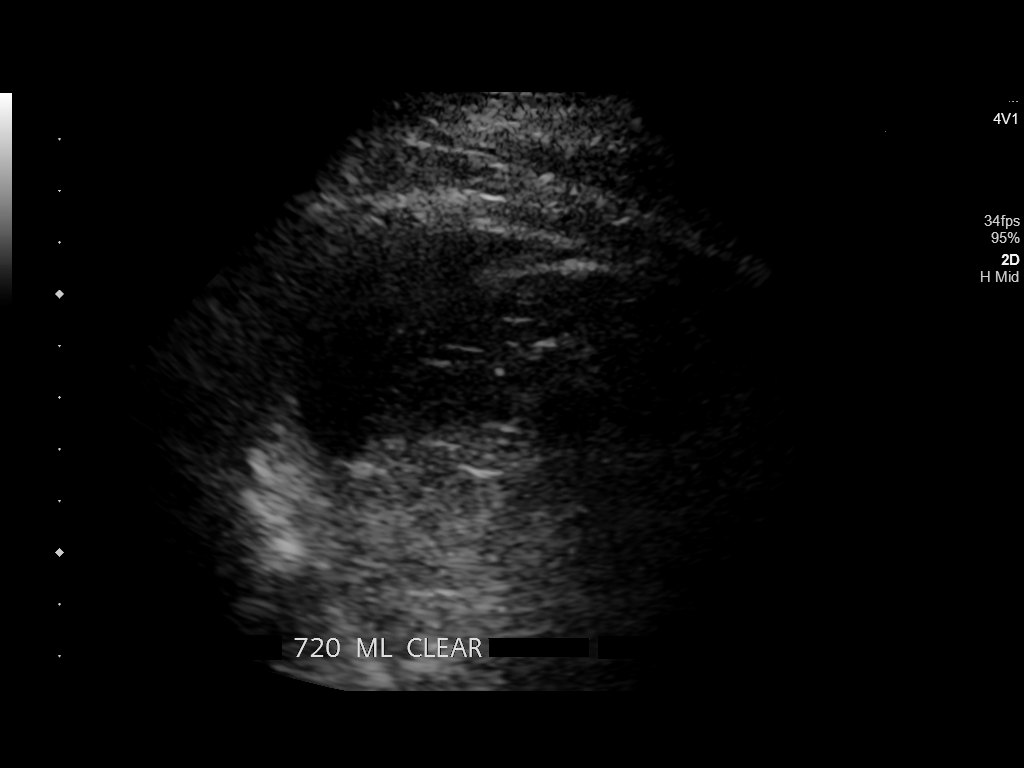

[4 of 4 positions shown; findings below may reference images not displayed]

EXAM:
ULTRASOUND GUIDED RIGHT THORACENTESIS

MEDICATIONS:
None.

COMPLICATIONS:
None immediate.

PROCEDURE:
An ultrasound guided thoracentesis was thoroughly discussed with the
patient and questions answered. The benefits, risks, alternatives
and complications were also discussed. The patient understands and
wishes to proceed with the procedure. Written consent was obtained.

Ultrasound was performed to localize and mark an adequate pocket of
fluid in the right chest. The area was then prepped and draped in
the normal sterile fashion. 1% Lidocaine was used for local
anesthesia. Under ultrasound guidance a Safe-T-Centesis catheter was
introduced. Thoracentesis was performed. The catheter was removed
and a dressing applied.
FINDINGS: A total of approximately 720 mL of clear yellow fluid was removed.
Samples were sent to the laboratory as requested by the clinical
team.
IMPRESSION: Successful ultrasound guided right thoracentesis yielding 720 mL of
pleural fluid.

No pneumothorax on follow-up radiograph.

## 2020-11-21 IMAGING — CT CT THORACIC SPINE WITHOUT CONTRAST
2 of 3 series · 11 of 33 positions shown, 13 images · non-contrast
Comparison: Comparison made with prior radiograph from earlier the
same day as well as prior chest CT from 06/05/2018.

CLINICAL DATA: Initial evaluation for back pain. History of COPD,
cardiac stent.

EXAM:
CT CHEST WITHOUT CONTRAST
CT THORACIC SPINE WITHOUT CONTRAST
TECHNIQUE: Multidetector CT imaging of the chest was performed following the
standard protocol without IV contrast.

[Series 4: t.spine st recons · axial · 0.29mm/px · z∈[-673,-417]mm · 8 of 152 slices shown, 10 images]
[im 12/152  soft-tissue]
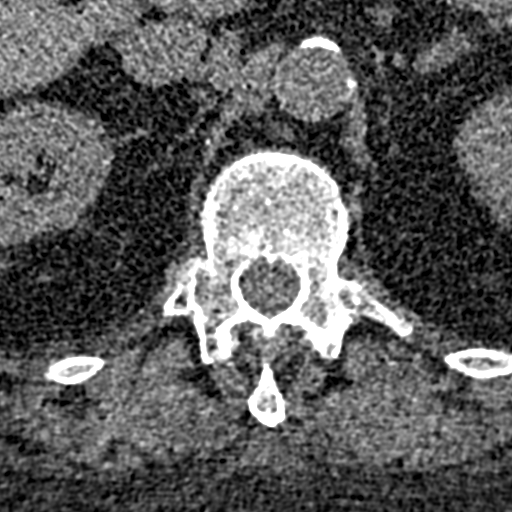
[im 12/152  bone]
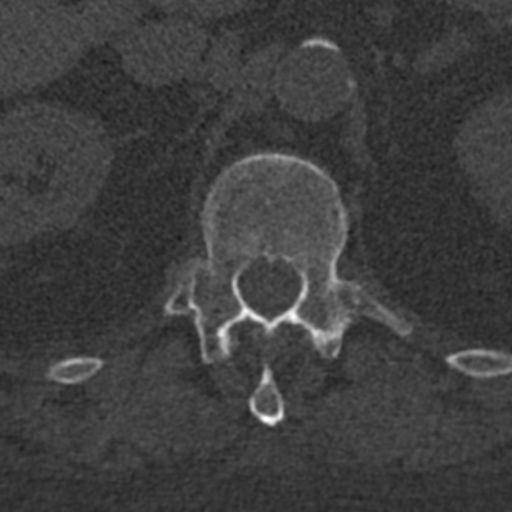
[im 35/152  bone]
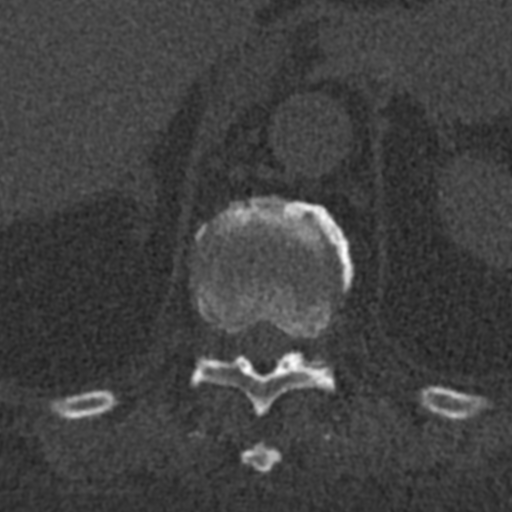
[im 47/152  bone]
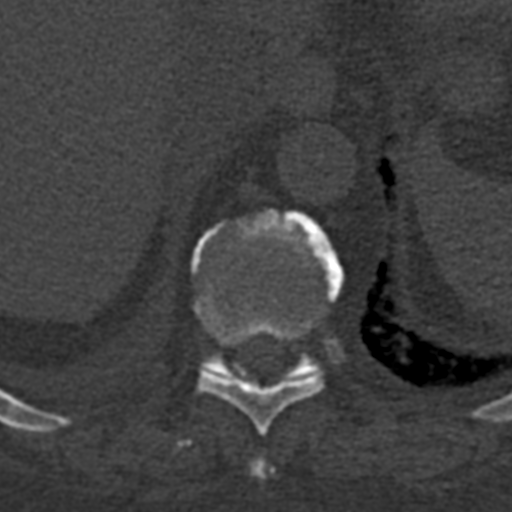
[im 70/152  bone]
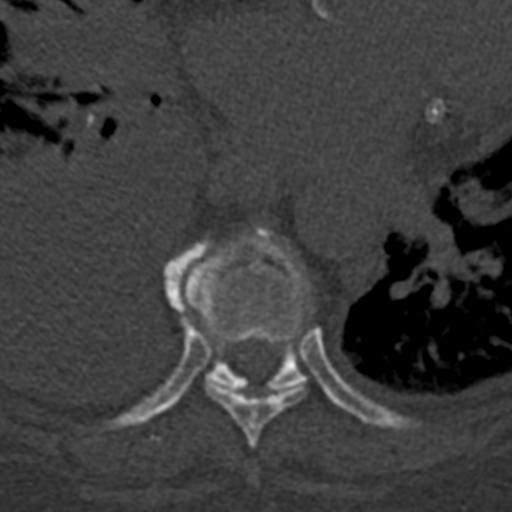
[im 82/152  soft-tissue]
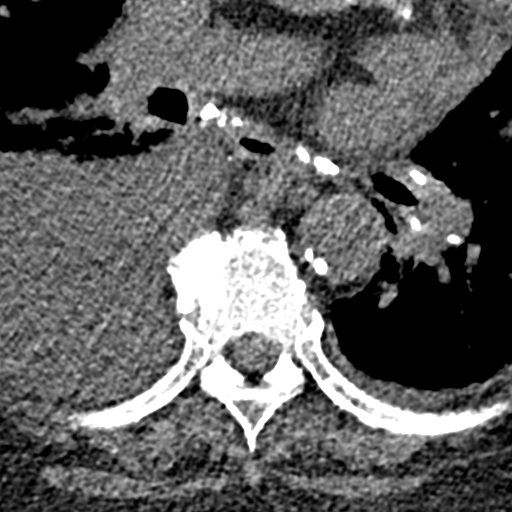
[im 82/152  bone]
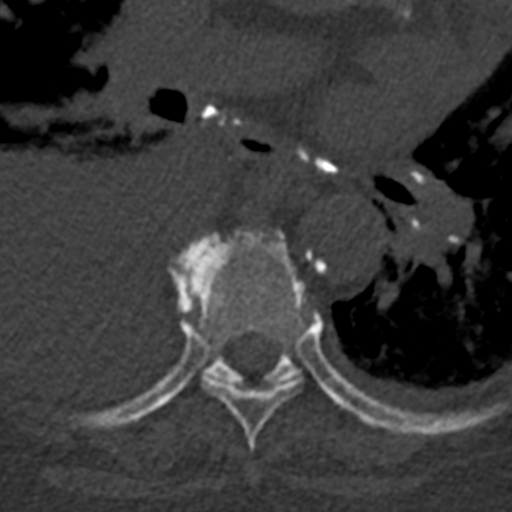
[im 105/152  bone]
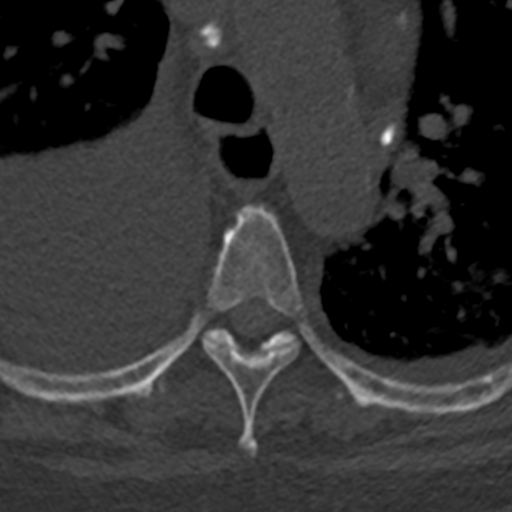
[im 117/152  bone]
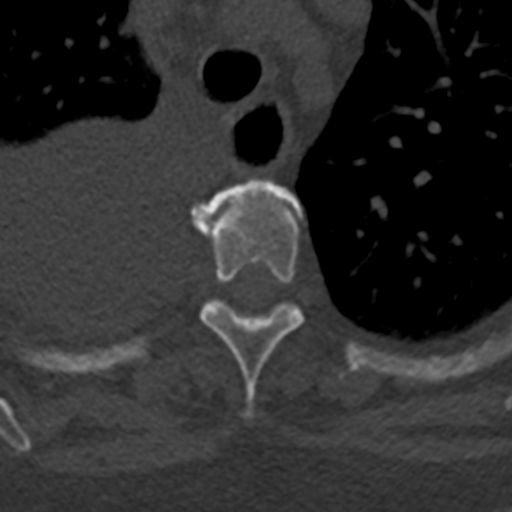
[im 140/152  bone]
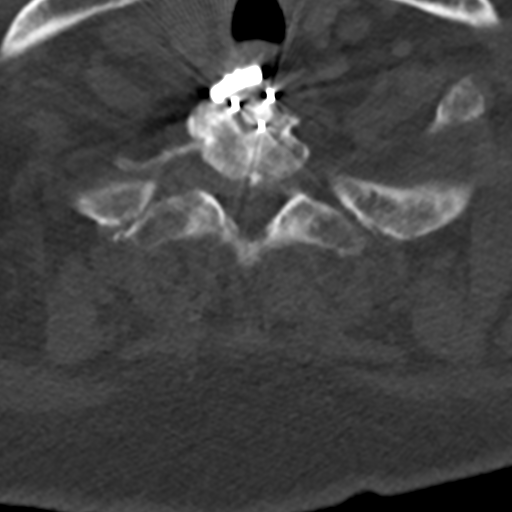

[Series 6: t.spine bone coronals · coronal · 0.29mm/px · 3 of 66 slices shown]
[im 14/66  bone]
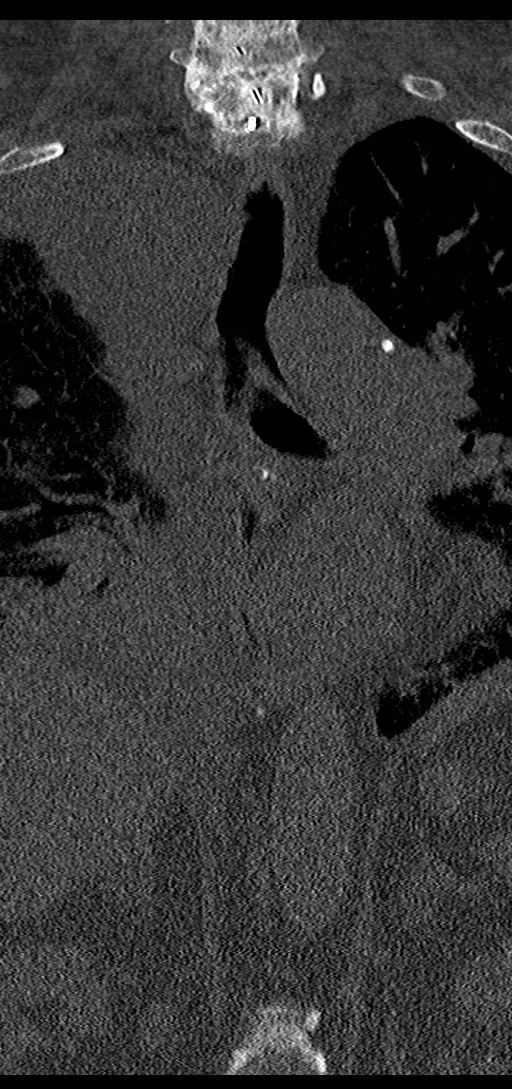
[im 27/66  bone]
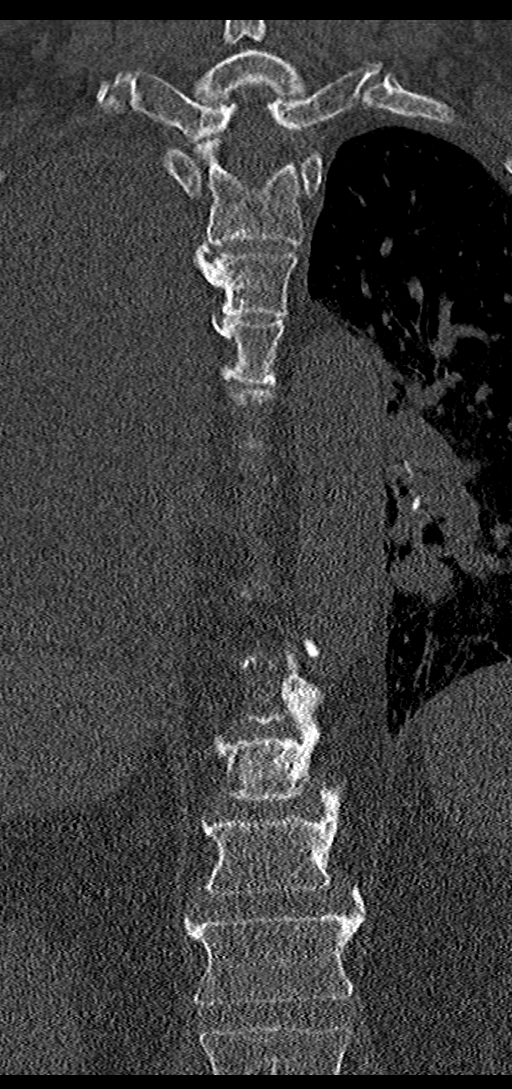
[im 40/66  bone]
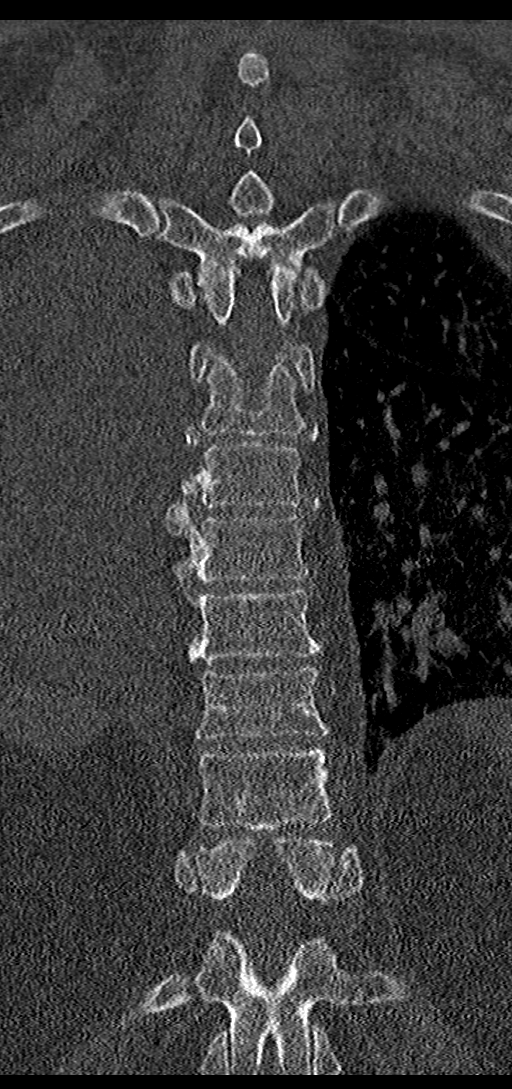

[11 of 33 positions shown; findings below may reference images not displayed]

FINDINGS: Cardiovascular: Intrathoracic aorta normal in caliber without
aneurysm or visible acute finding on this noncontrast examination.
Moderate atherosclerosis. Visualized great vessels normal in caliber
without obvious abnormality. Cardiomegaly with 3 vessel coronary
artery calcifications. No pericardial effusion. Main pulmonary
artery dilated up to 3.1 cm, suggesting chronic underlying pulmonary
hypertension, similar to previous.

Mediastinum/Nodes: Thyroid normal. Multiple predominantly
subcentimeter calcified lymph nodes seen throughout the mediastinum
and bilateral hila, compatible with prior granulomatous infection.
No enlarged axillary nodes. Overall, appearance is stable from
previous. No new or concerning adenopathy. Trace layering secretions
noted within the esophageal lumen. Esophagus otherwise unremarkable.

Lungs/Pleura: Tracheobronchial tree intact and patent. Large
layering right pleural effusion with associated sub pulmonic
component. Small layering left effusion present as well. Associated
consolidative opacities present within the right middle and lower
lobes. Mild hazy opacities within the posterior left upper and lower
lobes favored to reflect atelectasis and/or mild congestion. No
pneumothorax. 5 mm right lower lobe nodule stable from previous,
likely related to prior granulomatous disease. No other new
worrisome pulmonary nodule or mass.

Upper Abdomen: Visualized upper abdomen demonstrates no acute
finding. Adrenal glands are somewhat diffusely enlarged bilaterally,
which could reflect underlying adrenal hyperplasia, stable from
previous. Scattered prominent gastrohepatic, porta hepatis, and
aortocaval lymph nodes noted, again likely related to prior
granulomatous infection. Few punctate calcified granulomas noted
within the spleen.

Musculoskeletal: External soft tissues demonstrate no acute finding.
No acute osseous abnormality. No discrete lytic or blastic osseous
lesions. Remotely healed fracture of the left posterolateral sixth
rib noted. Visualized external soft tissues demonstrate no acute
finding.

CT THORACIC SPINE

Alignment: Vertebral bodies normally aligned with preservation of
the normal thoracic kyphosis. No listhesis or malalignment.

Vertebrae: Cervical ACDF partially visualized. Vertebral body
heights well maintained without evidence for acute or chronic
fracture. No discrete lytic or blastic osseous lesions. Visualized
ribs intact and normal.

Paraspinal and other soft tissues: Paraspinous soft tissues
demonstrate no acute finding.

Disc levels: Multilevel degenerative endplate spurring seen
throughout the thoracic spine, most notable within the mid and lower
aspect. Mild scattered posterior element hypertrophy, most notable
at T10-11. No significant disc pathology. No significant spinal
stenosis or significant bony foraminal narrowing.
IMPRESSION: CT CHEST IMPRESSION:

1. Large right with small left layering pleural effusions.
Associated consolidative opacity involving the right middle and
lower lobes felt to be most consistent with compressive atelectasis,
although superimposed infiltrate could be considered in the correct
clinical setting.
2. Sequelae of chronic granulomatous disease with multiple calcified
mediastinal and hilar adenopathy, with calcified granulomata of the
spleen.
3. Dilatation of the main pulmonary artery up to 3.1 cm, suggesting
a degree of underlying pulmonary hypertension, stable from previous
and likely chronic.
4. Cardiomegaly with diffuse 3 vessel coronary artery calcifications
with moderate aortic atherosclerosis.
5. Cardiomegaly with 3 vessel coronary artery calcifications.
6. Mild thickening of the adrenal glands bilaterally, which could
reflect an underlying degree of adrenal hyperplasia.

CT THORACIC SPINE IMPRESSION:

1. No fracture or other acute abnormality within the thoracic spine.
2. Moderate chronic endplate degenerative spurring throughout the
thoracic spine. No appreciable significant disc pathology or
stenosis.
3. Mild multilevel facet hypertrophy, most notable at T10-11, which
could contribute to underlying back pain.

## 2020-11-21 IMAGING — DX PORTABLE CHEST - 1 VIEW
1 series · 1 of 1 positions shown · non-contrast
Comparison: Earlier same day

CLINICAL DATA: Follow-up thoracentesis

EXAM:
PORTABLE CHEST 1 VIEW

[chest ap]
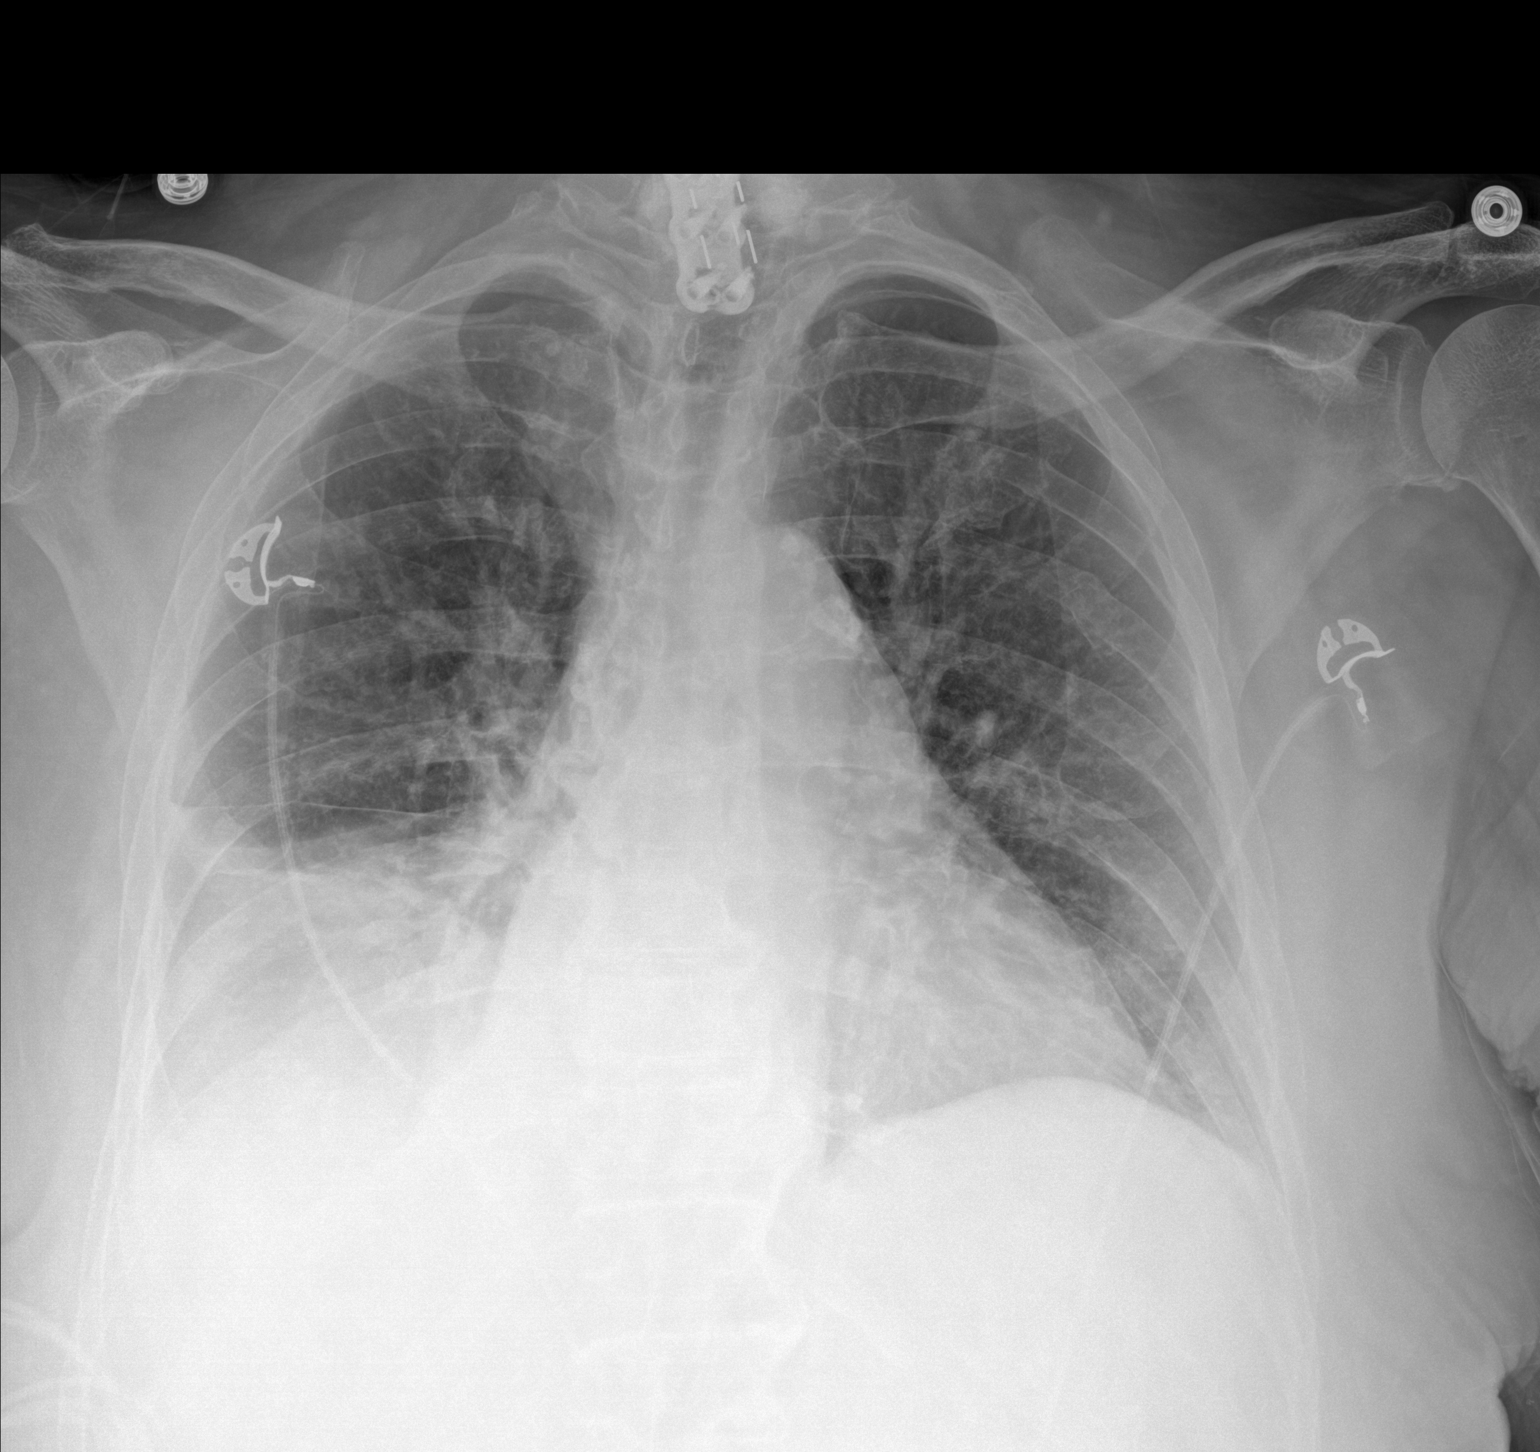

[1 of 1 positions shown; findings below may reference images not displayed]

FINDINGS: There is less pleural fluid on the right. No pneumothorax. Some
pleural fluid does persist with basilar volume loss. Left chest
remains clear. Pulmonary venous hypertension again noted
IMPRESSION: Less pleural fluid on the right following thoracentesis, with less
right lung atelectasis. No pneumothorax.

## 2020-11-21 IMAGING — CR CHEST - 2 VIEW
2 series · 2 of 2 positions shown · non-contrast
Comparison: 06/07/2018

CLINICAL DATA: Chest pain

EXAM:
CHEST - 2 VIEW

[chest ap]
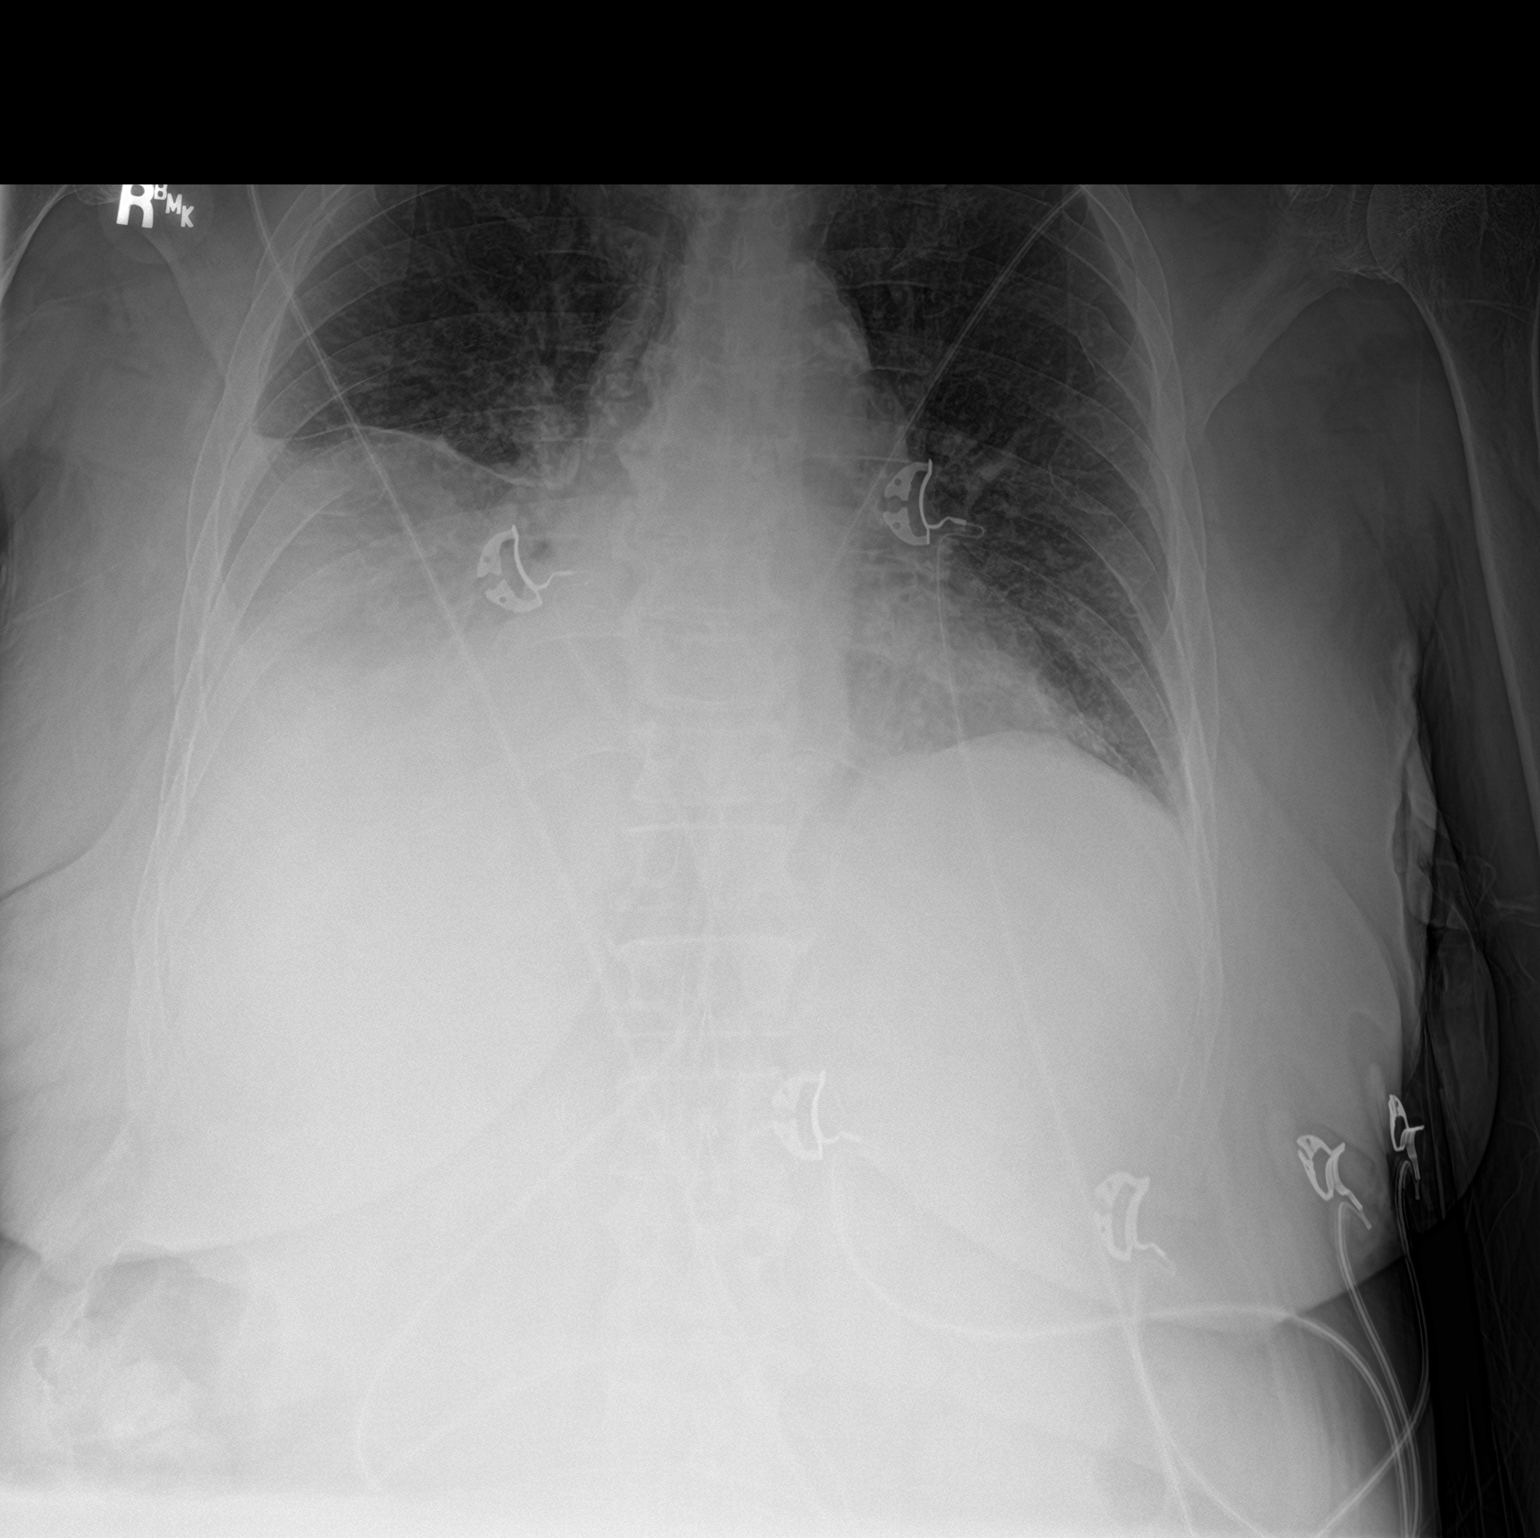

[chest lat]
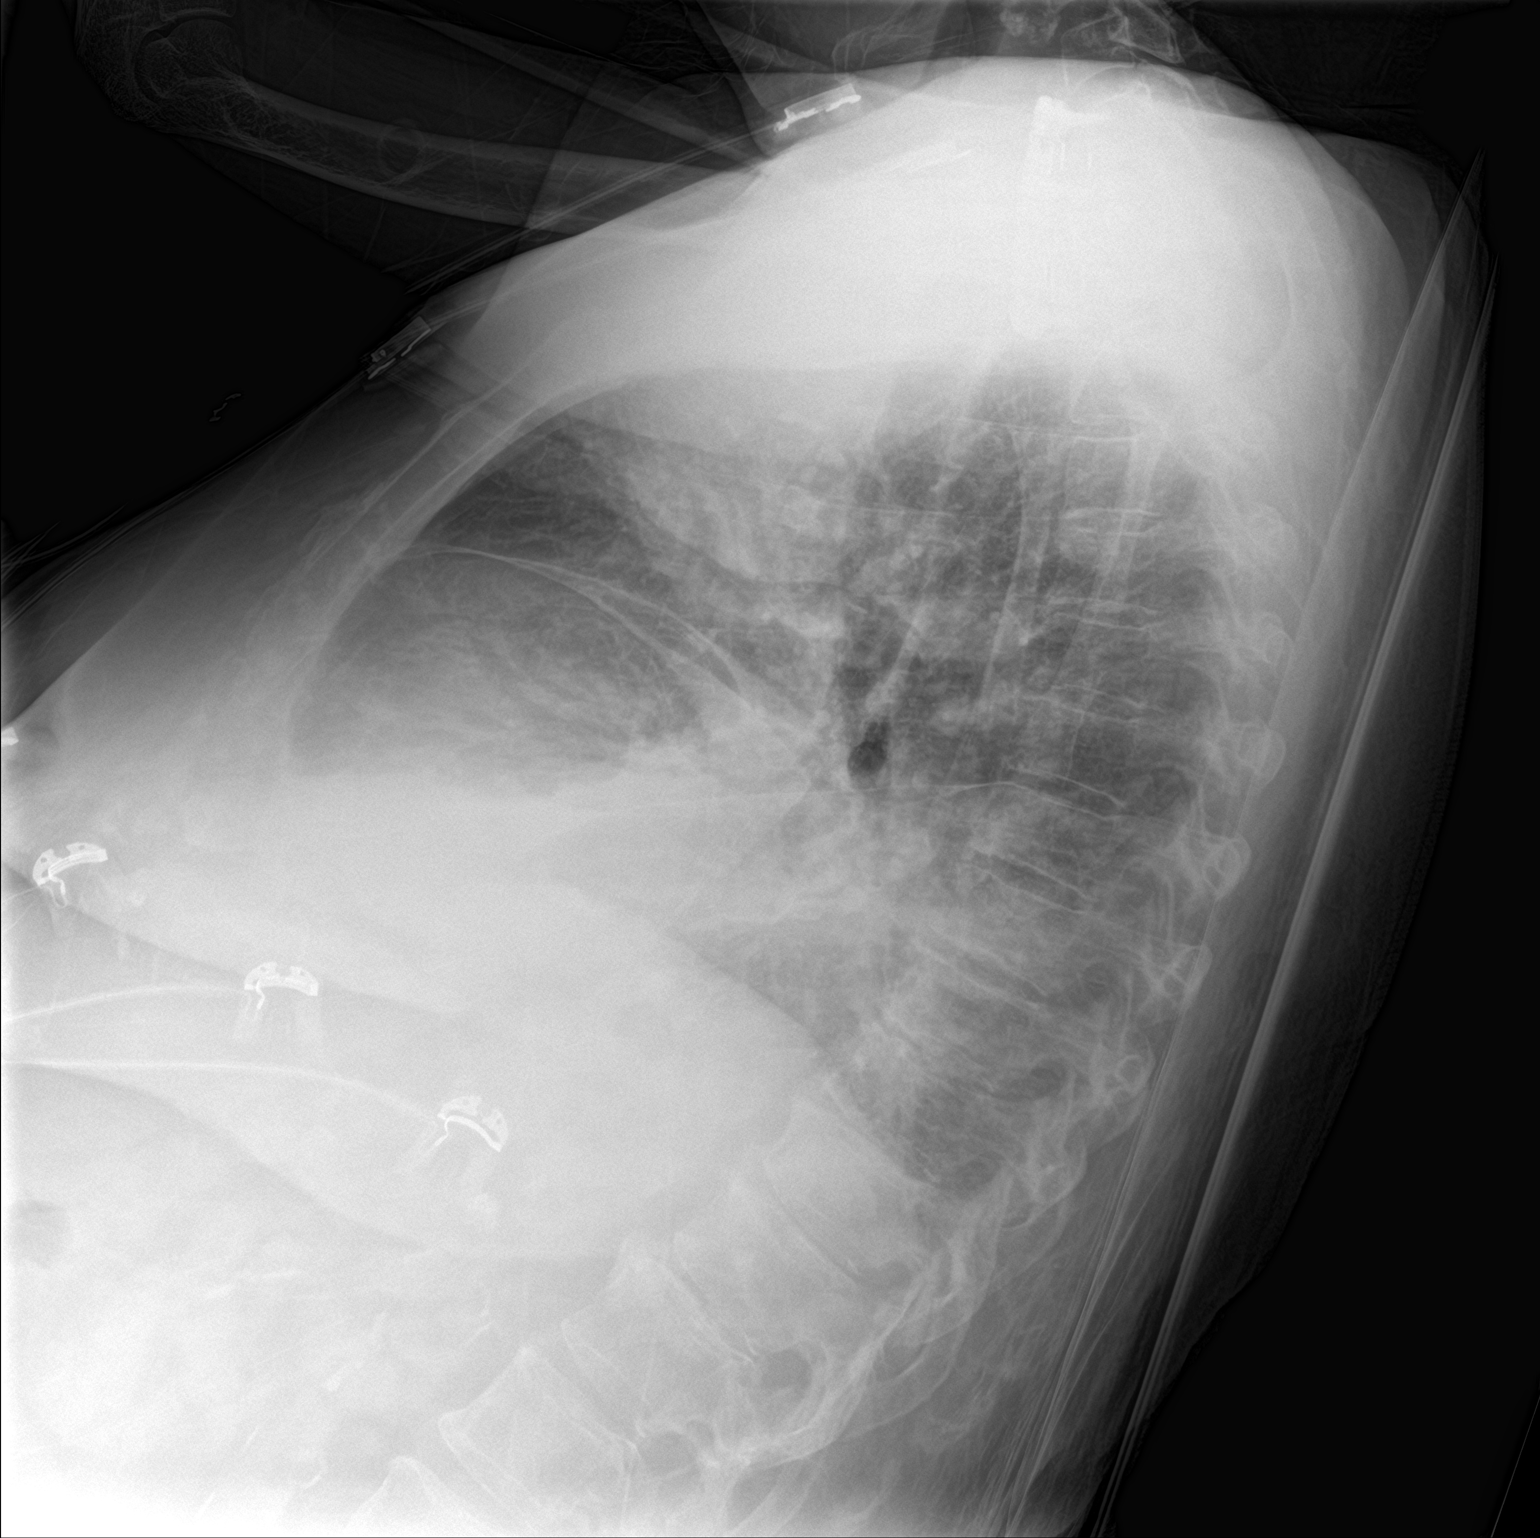

[2 of 2 positions shown; findings below may reference images not displayed]

FINDINGS: Normal heart size and stable mediastinal contours. Dense right lower
lobe airspace disease. Right middle lobe airspace disease with
upward bowing of the minor fissure. Generalized interstitial
coarsening. There may be pleural fluid on the right. No
pneumothorax.
IMPRESSION: Right middle and lower lobe consolidation.

## 2020-11-22 IMAGING — CR CHEST - 2 VIEW
1 series · 2 of 2 positions shown · non-contrast
Comparison: 07/30/2018

CLINICAL DATA: Recurrent right pleural effusion. Pulmonary
sarcoidosis. Respiratory failure.

EXAM:
CHEST - 2 VIEW

[Series 1: dg chest 2 view · 0.14mm/px · 2 of 2 slices shown]
[im 1/2]
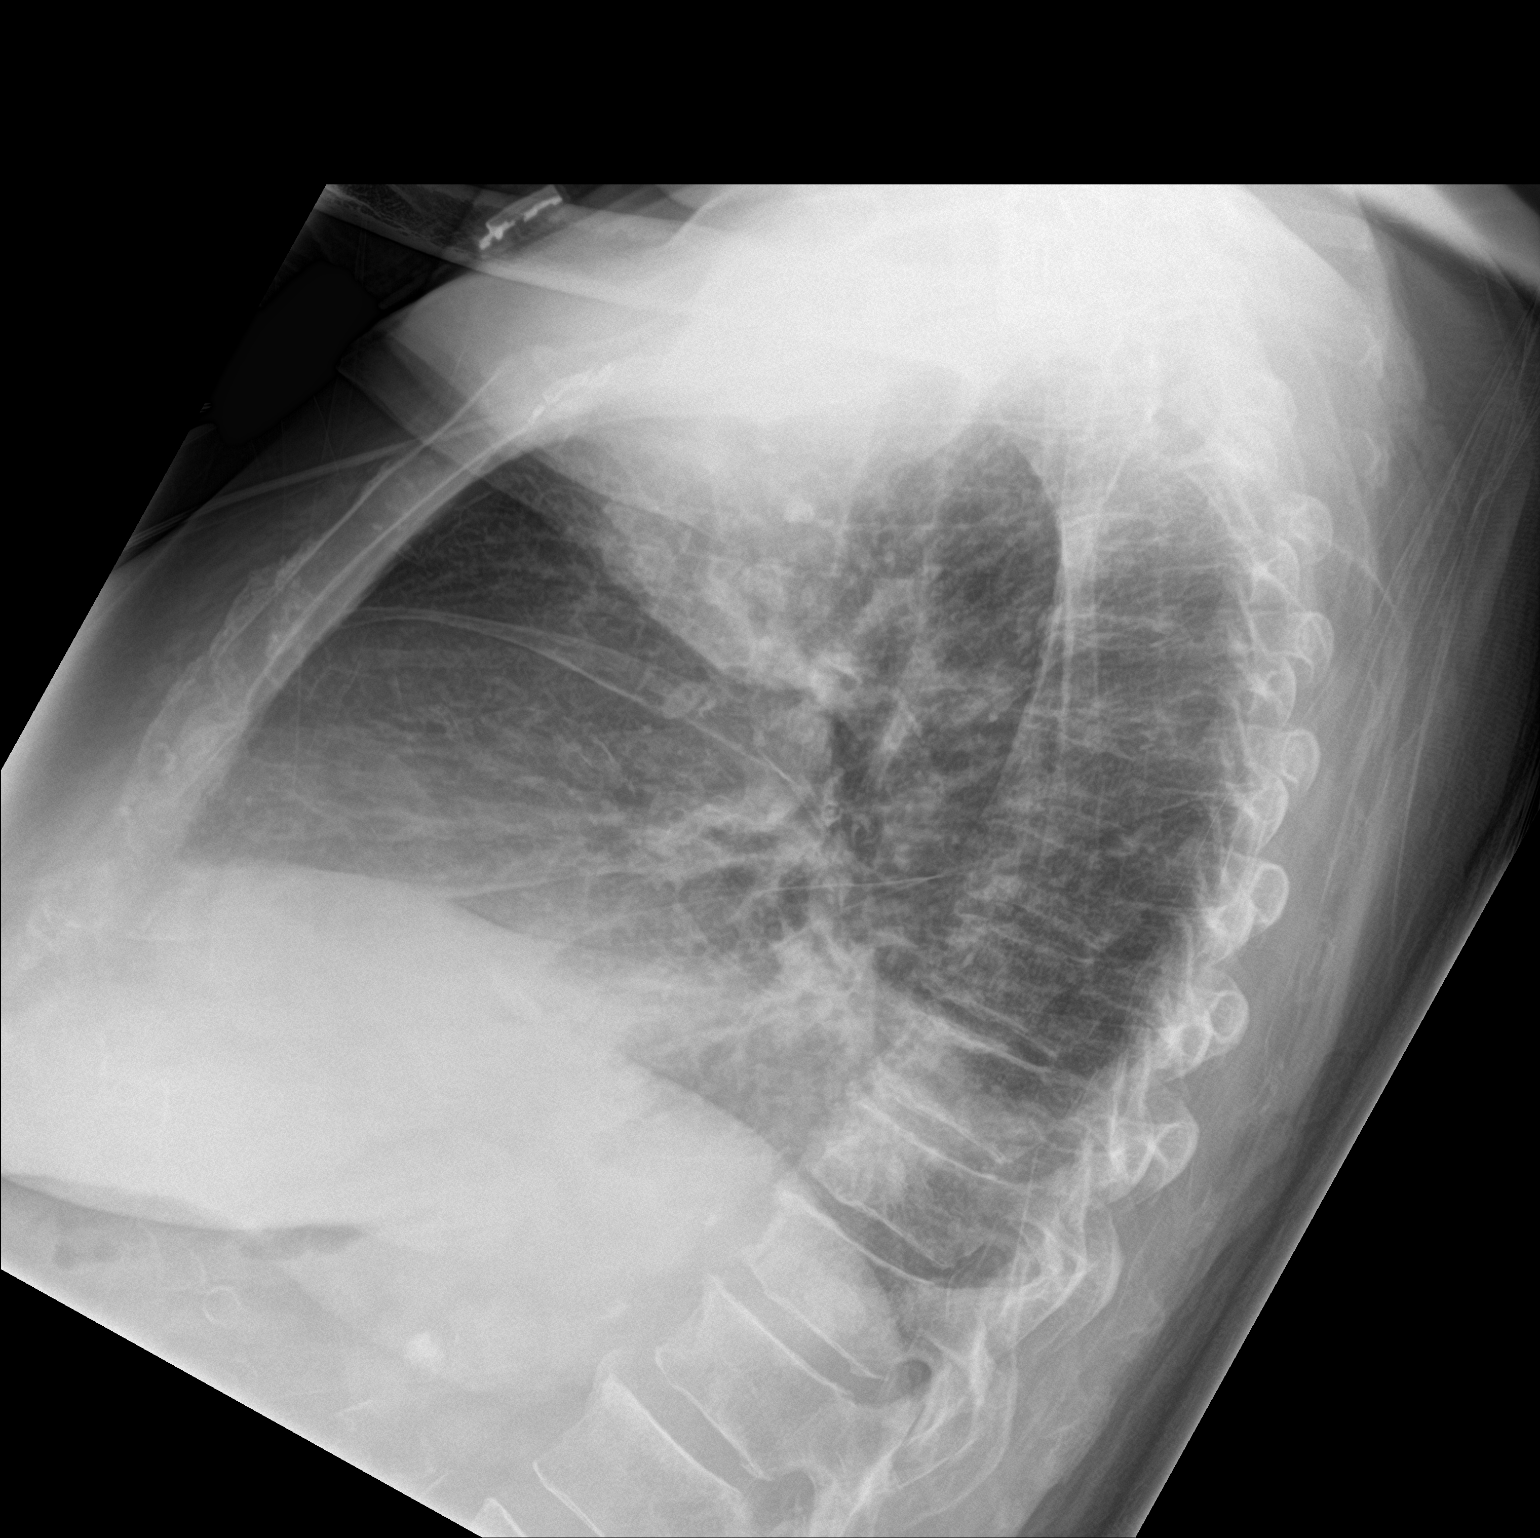
[im 2/2]
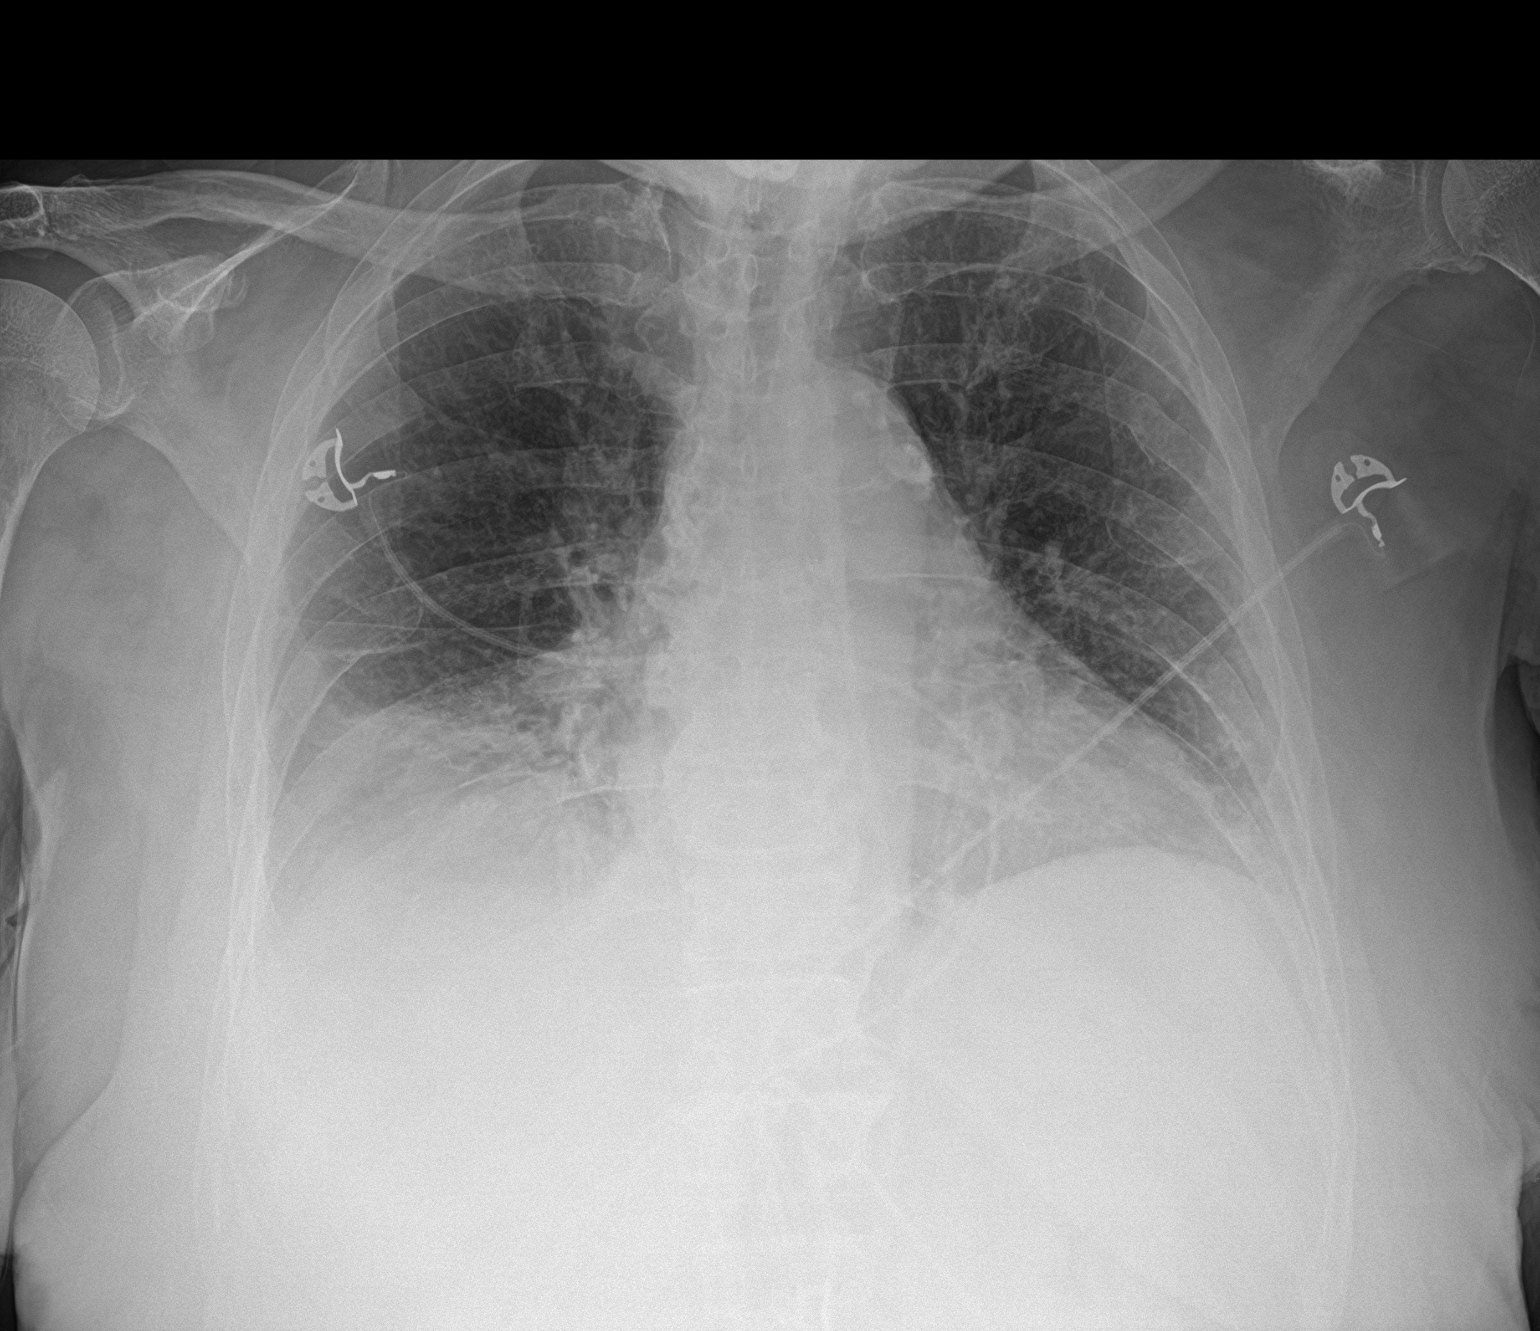

[2 of 2 positions shown; findings below may reference images not displayed]

FINDINGS: Lungs are somewhat hypoinflated demonstrate persistent right base
opacification without significant change likely small to moderate
size effusion with associated basilar atelectasis. Mild stable
cardiomegaly. Remainder of the exam is unchanged.
IMPRESSION: Stable small to moderate size right pleural effusion likely with
associated basilar atelectasis.

Mild stable cardiomegaly.

## 2023-01-11 DEATH — deceased
